# Patient Record
Sex: Male | Born: 1940 | Race: White | Hispanic: No | Marital: Married | Smoking: Former smoker
Health system: Southern US, Community
[De-identification: ages and names within clinical notes are randomized; demographics above are authoritative.]

## PROBLEM LIST (undated history)

## (undated) DIAGNOSIS — I5030 Unspecified diastolic (congestive) heart failure: Secondary | ICD-10-CM

## (undated) DIAGNOSIS — G4733 Obstructive sleep apnea (adult) (pediatric): Secondary | ICD-10-CM

## (undated) DIAGNOSIS — D472 Monoclonal gammopathy: Secondary | ICD-10-CM

## (undated) DIAGNOSIS — N400 Enlarged prostate without lower urinary tract symptoms: Secondary | ICD-10-CM

## (undated) DIAGNOSIS — S065X9A Traumatic subdural hemorrhage with loss of consciousness of unspecified duration, initial encounter: Secondary | ICD-10-CM

## (undated) DIAGNOSIS — I2510 Atherosclerotic heart disease of native coronary artery without angina pectoris: Secondary | ICD-10-CM

## (undated) DIAGNOSIS — I4891 Unspecified atrial fibrillation: Secondary | ICD-10-CM

## (undated) DIAGNOSIS — J8410 Pulmonary fibrosis, unspecified: Secondary | ICD-10-CM

## (undated) DIAGNOSIS — G629 Polyneuropathy, unspecified: Secondary | ICD-10-CM

## (undated) DIAGNOSIS — Z950 Presence of cardiac pacemaker: Secondary | ICD-10-CM

## (undated) DIAGNOSIS — E7801 Familial hypercholesterolemia: Secondary | ICD-10-CM

## (undated) DIAGNOSIS — E119 Type 2 diabetes mellitus without complications: Secondary | ICD-10-CM

## (undated) DIAGNOSIS — I10 Essential (primary) hypertension: Secondary | ICD-10-CM

## (undated) HISTORY — PX: CHOLECYSTECTOMY: SHX55

## (undated) HISTORY — DX: Pulmonary fibrosis, unspecified: J84.10

## (undated) HISTORY — DX: Obstructive sleep apnea (adult) (pediatric): G47.33

## (undated) HISTORY — PX: FOOT SURGERY: SHX648

## (undated) HISTORY — PX: ORCHIECTOMY: SHX2116

## (undated) HISTORY — PX: HERNIA REPAIR: SHX51

## (undated) HISTORY — DX: Atherosclerotic heart disease of native coronary artery without angina pectoris: I25.10

## (undated) HISTORY — DX: Presence of cardiac pacemaker: Z95.0

## (undated) HISTORY — DX: Type 2 diabetes mellitus without complications: E11.9

## (undated) HISTORY — DX: Traumatic subdural hemorrhage with loss of consciousness of unspecified duration, initial encounter: S06.5X9A

## (undated) HISTORY — DX: Polyneuropathy, unspecified: G62.9

## (undated) HISTORY — PX: CORONARY ARTERY BYPASS GRAFT: SHX141

## (undated) HISTORY — DX: Unspecified atrial fibrillation: I48.91

## (undated) HISTORY — DX: Benign prostatic hyperplasia without lower urinary tract symptoms: N40.0

## (undated) HISTORY — DX: Monoclonal gammopathy: D47.2

## (undated) HISTORY — PX: APPENDECTOMY: SHX54

## (undated) HISTORY — PX: PACEMAKER PLACEMENT: SHX43

## (undated) HISTORY — DX: Familial hypercholesterolemia: E78.01

## (undated) HISTORY — DX: Essential (primary) hypertension: I10

## (undated) HISTORY — DX: Unspecified diastolic (congestive) heart failure: I50.30

---

## 1999-01-21 ENCOUNTER — Ambulatory Visit (HOSPITAL_COMMUNITY): Admission: RE | Admit: 1999-01-21 | Discharge: 1999-01-21 | Payer: Self-pay | Admitting: Gastroenterology

## 2000-01-03 ENCOUNTER — Encounter: Admission: RE | Admit: 2000-01-03 | Discharge: 2000-01-03 | Payer: Self-pay | Admitting: Urology

## 2000-01-03 ENCOUNTER — Encounter: Payer: Self-pay | Admitting: Urology

## 2000-01-12 ENCOUNTER — Encounter: Payer: Self-pay | Admitting: Urology

## 2000-01-12 ENCOUNTER — Encounter: Admission: RE | Admit: 2000-01-12 | Discharge: 2000-01-12 | Payer: Self-pay | Admitting: Urology

## 2000-01-20 ENCOUNTER — Encounter: Admission: RE | Admit: 2000-01-20 | Discharge: 2000-01-20 | Payer: Self-pay | Admitting: Urology

## 2000-01-20 ENCOUNTER — Encounter: Payer: Self-pay | Admitting: Urology

## 2000-02-03 ENCOUNTER — Encounter: Admission: RE | Admit: 2000-02-03 | Discharge: 2000-02-03 | Payer: Self-pay | Admitting: Urology

## 2000-02-03 ENCOUNTER — Encounter: Payer: Self-pay | Admitting: Urology

## 2000-02-16 ENCOUNTER — Encounter: Payer: Self-pay | Admitting: Urology

## 2000-02-16 ENCOUNTER — Ambulatory Visit (HOSPITAL_BASED_OUTPATIENT_CLINIC_OR_DEPARTMENT_OTHER): Admission: RE | Admit: 2000-02-16 | Discharge: 2000-02-16 | Payer: Self-pay | Admitting: Urology

## 2000-10-15 ENCOUNTER — Ambulatory Visit (HOSPITAL_COMMUNITY): Admission: RE | Admit: 2000-10-15 | Discharge: 2000-10-15 | Payer: Self-pay | Admitting: Internal Medicine

## 2001-10-18 ENCOUNTER — Encounter: Admission: RE | Admit: 2001-10-18 | Discharge: 2001-10-18 | Payer: Self-pay | Admitting: General Surgery

## 2001-10-18 ENCOUNTER — Encounter (HOSPITAL_BASED_OUTPATIENT_CLINIC_OR_DEPARTMENT_OTHER): Payer: Self-pay | Admitting: General Surgery

## 2001-10-21 ENCOUNTER — Ambulatory Visit (HOSPITAL_BASED_OUTPATIENT_CLINIC_OR_DEPARTMENT_OTHER): Admission: RE | Admit: 2001-10-21 | Discharge: 2001-10-21 | Payer: Self-pay | Admitting: General Surgery

## 2003-01-01 ENCOUNTER — Encounter: Admission: RE | Admit: 2003-01-01 | Discharge: 2003-01-01 | Payer: Self-pay | Admitting: Urology

## 2003-01-01 ENCOUNTER — Encounter: Payer: Self-pay | Admitting: Urology

## 2004-03-07 ENCOUNTER — Encounter: Admission: RE | Admit: 2004-03-07 | Discharge: 2004-03-07 | Payer: Self-pay | Admitting: Internal Medicine

## 2004-06-17 ENCOUNTER — Encounter: Admission: RE | Admit: 2004-06-17 | Discharge: 2004-06-17 | Payer: Self-pay | Admitting: Neurology

## 2004-07-08 ENCOUNTER — Encounter: Admission: RE | Admit: 2004-07-08 | Discharge: 2004-07-08 | Payer: Self-pay | Admitting: Neurology

## 2004-07-22 ENCOUNTER — Encounter: Admission: RE | Admit: 2004-07-22 | Discharge: 2004-07-22 | Payer: Self-pay | Admitting: Neurology

## 2004-08-11 ENCOUNTER — Encounter: Admission: RE | Admit: 2004-08-11 | Discharge: 2004-08-11 | Payer: Self-pay | Admitting: Neurology

## 2005-08-06 ENCOUNTER — Emergency Department (HOSPITAL_COMMUNITY): Admission: EM | Admit: 2005-08-06 | Discharge: 2005-08-06 | Payer: Self-pay | Admitting: Family Medicine

## 2005-12-01 ENCOUNTER — Ambulatory Visit (HOSPITAL_COMMUNITY): Admission: RE | Admit: 2005-12-01 | Discharge: 2005-12-01 | Payer: Self-pay | Admitting: General Surgery

## 2006-07-21 ENCOUNTER — Encounter: Admission: RE | Admit: 2006-07-21 | Discharge: 2006-07-21 | Payer: Self-pay | Admitting: Internal Medicine

## 2007-02-18 ENCOUNTER — Emergency Department (HOSPITAL_COMMUNITY): Admission: EM | Admit: 2007-02-18 | Discharge: 2007-02-18 | Payer: Self-pay | Admitting: Family Medicine

## 2007-12-25 ENCOUNTER — Ambulatory Visit: Payer: Self-pay | Admitting: Vascular Surgery

## 2007-12-25 ENCOUNTER — Encounter: Payer: Self-pay | Admitting: Thoracic Surgery (Cardiothoracic Vascular Surgery)

## 2007-12-25 ENCOUNTER — Ambulatory Visit (HOSPITAL_COMMUNITY): Admission: RE | Admit: 2007-12-25 | Discharge: 2007-12-25 | Payer: Self-pay | Admitting: Cardiology

## 2007-12-26 ENCOUNTER — Ambulatory Visit: Payer: Self-pay | Admitting: Thoracic Surgery (Cardiothoracic Vascular Surgery)

## 2008-01-09 ENCOUNTER — Encounter: Payer: Self-pay | Admitting: Thoracic Surgery (Cardiothoracic Vascular Surgery)

## 2008-01-09 ENCOUNTER — Inpatient Hospital Stay (HOSPITAL_COMMUNITY)
Admission: RE | Admit: 2008-01-09 | Discharge: 2008-01-18 | Payer: Self-pay | Admitting: Thoracic Surgery (Cardiothoracic Vascular Surgery)

## 2008-01-10 ENCOUNTER — Ambulatory Visit: Payer: Self-pay | Admitting: Thoracic Surgery (Cardiothoracic Vascular Surgery)

## 2008-02-05 ENCOUNTER — Ambulatory Visit: Payer: Self-pay | Admitting: Thoracic Surgery (Cardiothoracic Vascular Surgery)

## 2008-02-05 ENCOUNTER — Encounter
Admission: RE | Admit: 2008-02-05 | Discharge: 2008-02-05 | Payer: Self-pay | Admitting: Thoracic Surgery (Cardiothoracic Vascular Surgery)

## 2008-02-10 ENCOUNTER — Encounter (HOSPITAL_COMMUNITY): Admission: RE | Admit: 2008-02-10 | Discharge: 2008-05-10 | Payer: Self-pay | Admitting: Cardiology

## 2008-02-18 ENCOUNTER — Ambulatory Visit (HOSPITAL_COMMUNITY): Admission: RE | Admit: 2008-02-18 | Discharge: 2008-02-18 | Payer: Self-pay | Admitting: Cardiology

## 2008-03-09 ENCOUNTER — Ambulatory Visit: Payer: Self-pay | Admitting: Thoracic Surgery (Cardiothoracic Vascular Surgery)

## 2008-04-06 ENCOUNTER — Ambulatory Visit (HOSPITAL_COMMUNITY): Admission: RE | Admit: 2008-04-06 | Discharge: 2008-04-07 | Payer: Self-pay | Admitting: Cardiology

## 2008-06-15 ENCOUNTER — Ambulatory Visit: Payer: Self-pay | Admitting: Thoracic Surgery (Cardiothoracic Vascular Surgery)

## 2008-08-31 ENCOUNTER — Emergency Department (HOSPITAL_COMMUNITY): Admission: EM | Admit: 2008-08-31 | Discharge: 2008-08-31 | Payer: Self-pay | Admitting: Emergency Medicine

## 2008-09-16 ENCOUNTER — Encounter: Admission: RE | Admit: 2008-09-16 | Discharge: 2008-09-16 | Payer: Self-pay | Admitting: Internal Medicine

## 2008-09-18 ENCOUNTER — Emergency Department (HOSPITAL_COMMUNITY): Admission: EM | Admit: 2008-09-18 | Discharge: 2008-09-18 | Payer: Self-pay | Admitting: Family Medicine

## 2008-10-20 ENCOUNTER — Ambulatory Visit (HOSPITAL_COMMUNITY): Admission: RE | Admit: 2008-10-20 | Discharge: 2008-10-20 | Payer: Self-pay | Admitting: *Deleted

## 2009-01-11 ENCOUNTER — Ambulatory Visit: Payer: Self-pay | Admitting: Thoracic Surgery (Cardiothoracic Vascular Surgery)

## 2009-01-16 ENCOUNTER — Emergency Department (HOSPITAL_COMMUNITY): Admission: EM | Admit: 2009-01-16 | Discharge: 2009-01-16 | Payer: Self-pay | Admitting: Emergency Medicine

## 2009-04-05 ENCOUNTER — Emergency Department (HOSPITAL_COMMUNITY): Admission: EM | Admit: 2009-04-05 | Discharge: 2009-04-05 | Payer: Self-pay | Admitting: Family Medicine

## 2009-04-15 ENCOUNTER — Emergency Department (HOSPITAL_COMMUNITY): Admission: EM | Admit: 2009-04-15 | Discharge: 2009-04-15 | Payer: Self-pay | Admitting: Family Medicine

## 2009-05-26 ENCOUNTER — Encounter: Payer: Self-pay | Admitting: Cardiology

## 2009-07-08 ENCOUNTER — Encounter: Payer: Self-pay | Admitting: Cardiology

## 2009-09-26 ENCOUNTER — Emergency Department (HOSPITAL_COMMUNITY): Admission: EM | Admit: 2009-09-26 | Discharge: 2009-09-26 | Payer: Self-pay | Admitting: Emergency Medicine

## 2009-09-27 ENCOUNTER — Emergency Department (HOSPITAL_COMMUNITY): Admission: EM | Admit: 2009-09-27 | Discharge: 2009-09-27 | Payer: Self-pay | Admitting: Emergency Medicine

## 2009-10-04 ENCOUNTER — Encounter: Payer: Self-pay | Admitting: Cardiology

## 2010-01-14 ENCOUNTER — Encounter: Payer: Self-pay | Admitting: Cardiology

## 2010-02-03 ENCOUNTER — Encounter: Payer: Self-pay | Admitting: Cardiology

## 2010-02-07 ENCOUNTER — Ambulatory Visit: Payer: Self-pay | Admitting: Thoracic Surgery (Cardiothoracic Vascular Surgery)

## 2010-04-27 ENCOUNTER — Ambulatory Visit: Payer: Self-pay | Admitting: Vascular Surgery

## 2010-05-12 ENCOUNTER — Encounter: Payer: Self-pay | Admitting: Cardiology

## 2010-08-16 DIAGNOSIS — I10 Essential (primary) hypertension: Secondary | ICD-10-CM | POA: Insufficient documentation

## 2010-08-16 DIAGNOSIS — E785 Hyperlipidemia, unspecified: Secondary | ICD-10-CM | POA: Insufficient documentation

## 2010-08-16 DIAGNOSIS — Z87898 Personal history of other specified conditions: Secondary | ICD-10-CM | POA: Insufficient documentation

## 2010-08-16 DIAGNOSIS — E119 Type 2 diabetes mellitus without complications: Secondary | ICD-10-CM | POA: Insufficient documentation

## 2010-08-16 DIAGNOSIS — G4730 Sleep apnea, unspecified: Secondary | ICD-10-CM | POA: Insufficient documentation

## 2010-08-16 DIAGNOSIS — E669 Obesity, unspecified: Secondary | ICD-10-CM | POA: Insufficient documentation

## 2010-08-16 DIAGNOSIS — I4891 Unspecified atrial fibrillation: Secondary | ICD-10-CM | POA: Insufficient documentation

## 2010-08-16 DIAGNOSIS — I499 Cardiac arrhythmia, unspecified: Secondary | ICD-10-CM | POA: Insufficient documentation

## 2010-08-17 ENCOUNTER — Ambulatory Visit: Payer: Self-pay | Admitting: Cardiology

## 2010-08-17 ENCOUNTER — Encounter: Payer: Self-pay | Admitting: Internal Medicine

## 2010-08-17 DIAGNOSIS — Z950 Presence of cardiac pacemaker: Secondary | ICD-10-CM | POA: Insufficient documentation

## 2010-08-26 ENCOUNTER — Ambulatory Visit: Payer: Self-pay | Admitting: Internal Medicine

## 2010-08-26 LAB — CONVERTED CEMR LAB: POC INR: 1.5

## 2010-09-01 ENCOUNTER — Ambulatory Visit: Payer: Self-pay | Admitting: Internal Medicine

## 2010-09-01 ENCOUNTER — Encounter: Payer: Self-pay | Admitting: Cardiology

## 2010-09-01 ENCOUNTER — Ambulatory Visit (HOSPITAL_COMMUNITY): Admission: RE | Admit: 2010-09-01 | Discharge: 2010-09-01 | Payer: Self-pay | Admitting: Cardiology

## 2010-09-01 ENCOUNTER — Ambulatory Visit: Payer: Self-pay

## 2010-09-01 ENCOUNTER — Encounter (INDEPENDENT_AMBULATORY_CARE_PROVIDER_SITE_OTHER): Payer: Self-pay

## 2010-09-01 LAB — CONVERTED CEMR LAB: POC INR: 1.5

## 2010-09-09 ENCOUNTER — Emergency Department (HOSPITAL_COMMUNITY): Admission: EM | Admit: 2010-09-09 | Discharge: 2010-09-09 | Payer: Self-pay | Admitting: Emergency Medicine

## 2010-09-09 ENCOUNTER — Ambulatory Visit: Payer: Self-pay | Admitting: Cardiology

## 2010-09-09 LAB — CONVERTED CEMR LAB: POC INR: 2

## 2010-09-14 ENCOUNTER — Ambulatory Visit (HOSPITAL_COMMUNITY): Admission: RE | Admit: 2010-09-14 | Discharge: 2010-09-14 | Payer: Self-pay | Admitting: Urology

## 2010-09-15 ENCOUNTER — Ambulatory Visit: Payer: Self-pay | Admitting: Cardiology

## 2010-09-15 LAB — CONVERTED CEMR LAB: POC INR: 1.6

## 2010-09-23 ENCOUNTER — Ambulatory Visit: Payer: Self-pay | Admitting: Cardiology

## 2010-09-23 LAB — CONVERTED CEMR LAB: POC INR: 1.7

## 2010-09-29 ENCOUNTER — Ambulatory Visit: Payer: Self-pay | Admitting: Cardiology

## 2010-09-29 LAB — CONVERTED CEMR LAB: POC INR: 2.2

## 2010-10-13 ENCOUNTER — Ambulatory Visit: Payer: Self-pay | Admitting: Cardiology

## 2010-10-13 LAB — CONVERTED CEMR LAB: POC INR: 1.8

## 2010-10-31 ENCOUNTER — Ambulatory Visit: Payer: Self-pay | Admitting: Cardiology

## 2010-10-31 LAB — CONVERTED CEMR LAB: POC INR: 2.1

## 2010-11-09 ENCOUNTER — Encounter: Payer: Self-pay | Admitting: Cardiology

## 2010-11-14 ENCOUNTER — Encounter: Payer: Self-pay | Admitting: Cardiology

## 2010-11-14 ENCOUNTER — Ambulatory Visit: Payer: Self-pay | Admitting: Internal Medicine

## 2010-11-14 LAB — CONVERTED CEMR LAB: POC INR: 1.8

## 2010-11-28 ENCOUNTER — Ambulatory Visit: Payer: Self-pay | Admitting: Cardiology

## 2010-11-28 LAB — CONVERTED CEMR LAB: POC INR: 1.7

## 2010-12-05 ENCOUNTER — Telehealth: Payer: Self-pay | Admitting: Cardiology

## 2010-12-09 LAB — CONVERTED CEMR LAB: POC INR: 1.9

## 2010-12-14 ENCOUNTER — Encounter: Payer: Self-pay | Admitting: Cardiology

## 2010-12-14 ENCOUNTER — Ambulatory Visit: Payer: Self-pay | Admitting: Cardiology

## 2010-12-20 ENCOUNTER — Ambulatory Visit: Admission: RE | Admit: 2010-12-20 | Discharge: 2010-12-20 | Payer: Self-pay | Source: Home / Self Care

## 2010-12-20 LAB — CONVERTED CEMR LAB: POC INR: 1.1

## 2010-12-27 ENCOUNTER — Ambulatory Visit: Admission: RE | Admit: 2010-12-27 | Discharge: 2010-12-27 | Payer: Self-pay | Source: Home / Self Care

## 2010-12-27 LAB — CONVERTED CEMR LAB: POC INR: 1.7

## 2011-01-08 ENCOUNTER — Encounter: Payer: Self-pay | Admitting: Thoracic Surgery (Cardiothoracic Vascular Surgery)

## 2011-01-10 ENCOUNTER — Ambulatory Visit: Admission: RE | Admit: 2011-01-10 | Discharge: 2011-01-10 | Payer: Self-pay | Source: Home / Self Care

## 2011-01-10 LAB — CONVERTED CEMR LAB
INR: 2.3
POC INR: 2.3

## 2011-01-19 NOTE — Procedures (Signed)
Summary: [facility]   Current Medications (verified): 1)  Klor-Con 20 Meq Pack (Potassium Chloride) .... Once Daily 2)  Flomax 0.4 Mg Caps (Tamsulosin Hcl) .... At Bedtime (Pt Has Rx Listed As 5mg ) 3)  Furosemide 40 Mg Tabs (Furosemide) .... 3-4 Times Per Week,  .as Needed 4)  Lopressor 50 Mg Tabs (Metoprolol Tartrate) .... 2 Daily 5)  Multaq 400 Mg Tabs (Dronedarone Hcl) .... 2 Daily 6)  Zoloft 100 Mg Tabs (Sertraline Hcl) .[name].. 1 1/2 Tablets Daily 7)  Crestor 10 Mg Tabs (Rosuvastatin Calcium) .... Once Daily 8)  Aspir-Low 81 Mg Tbec (Aspirin) .... Once Daily 9)  Marepa 1000 Mg Caps (Omega-3 Fatty Acids) .... 3 Daily 10)  Warfarin Sodium 5 Mg Tabs (Warfarin Sodium) .... Use As Directed By Anticoagulation Clinic  Allergies (verified): No Known Drug Allergies  PPM Specifications Following MD:  Lewayne Bunting, MD     PPM Vendor:  [employer] Jude     PPM Model Number:  [id number]     PPM Serial Number:  [id number] PPM DOI:  04/06/2008     PPM Implanting MD:  TYSINGER  Lead 1    Location: RA     DOI: 04/06/2008     Model #: [id number]     Serial #: [id number]     Status: active Lead 2    Location: RV     DOI: 04/06/2008     Model #: [id number]     Serial #: [id number]     Status: active  PPM Follow Up Battery Voltage:  2.75 V     Battery Est. Longevity:  7.25-9.75 YRS       PPM Device Measurements [facility]  Amplitude: 3.5 mV, Impedance: 498 ohms, Threshold: 0.75 V at 0.5 msec Right Ventricle  Amplitude: 9.6 mV, Impedance: 594 ohms, Threshold: 0.625 V at 0.5 msec  Episodes MS Episodes:  22     Percent Mode Switch:  <1%     Ventricular High Rate:  0     Atrial Pacing:  96%     Ventricular Pacing:  10%  Parameters Mode:  DDDR     Lower Rate Limit:  70     Upper Rate Limit:  120 Paced AV Delay:  200     Sensed AV Delay:  200 Next Cardiology Appt Due:  02/16/2011 Tech Comments:  NEW PT (TYSINGER PT)---22 AMS EPISODES--LONGEST WAS 3 MINUTES 32 SECONDS.  NORMAL DEVICE FUNCTION.  CHANGED RA OUTPUT FROM 2.50  TO 2.00 V.  ROV IN 6 MTHS W/GT TO ESTABLISH.  Vella Kohler  August 17, 2010 12:40 PM

## 2011-01-19 NOTE — Medication Information (Signed)
Summary: rov/sl  Anticoagulant Therapy  Managed by: Lyna Poser, PharmD PCP: Dr. Merri Brunette Supervising MD: Shirlee Latch MD, Freida Busman Indication 1: Atrial Fibrillation 427.31 Lab Used: [facility] [place] Site: [address] INR POC 2.1 INR RANGE 2.0-3.0  Dietary changes: no    Health status changes: no    Bleeding/hemorrhagic complications: no    Recent/future hospitalizations: no    Any changes in medication regimen? no    Recent/future dental: no  Any missed doses?: no       Is patient compliant with meds? yes       Allergies: 1)  * Definity  Anticoagulation Management History:      The patient is taking warfarin and comes in today for a routine follow up visit.  Positive risk factors for bleeding include an age of 51 years or older and presence of serious comorbidities.  The bleeding index is 'intermediate risk'.  Positive CHADS2 values include History of HTN and History of Diabetes.  Negative CHADS2 values include Age > 85 years old.  Anticoagulation responsible provider: Shirlee Latch MD, [place].  INR POC: 2.1.  Cuvette Lot#: [id number].  Exp: 10/19/2011.    Anticoagulation Management Assessment/Plan:      The patient's current anticoagulation dose is Warfarin sodium 5 mg tabs: Use as directed by Anticoagulation Clinic.  The target INR is 2.0-3.0.  The next INR is due 11/14/2010.  Anticoagulation instructions were given to patient.  Results were reviewed/authorized by Lyna Poser, PharmD.  He was notified by Lyna Poser PharmD.         Prior Anticoagulation Instructions: INR 1.8  Take Coumadin 1.5 tabs (7.5 mg) on Thursdays and Coumadin 1 tab (5 mg) on all other days. Return to clinic in 2 weeks.   Current Anticoagulation Instructions: INR 2.1 Continue taking 1 tablet everyday except 1.5 tablets on thursday. Recheck in 2 weeks.

## 2011-01-19 NOTE — Medication Information (Signed)
Summary: ROV  Anticoagulant Therapy  Managed by: Bethena Midget, RN, BSN PCP: Dr. Merri Brunette Supervising MD: Gala Romney MD, Reuel Boom Indication 1: Atrial Fibrillation 427.31 Lab Used: [facility] [place] Site: [address] INR POC 1.7 INR RANGE 2.0-3.0  Dietary changes: no    Health status changes: no    Bleeding/hemorrhagic complications: no    Recent/future hospitalizations: no    Any changes in medication regimen? no    Recent/future dental: no  Any missed doses?: no       Is patient compliant with meds? yes       Allergies: 1)  * Definity  Anticoagulation Management History:      The patient is taking warfarin and comes in today for a routine follow up visit.  Positive risk factors for bleeding include an age of 68 years or older and presence of serious comorbidities.  The bleeding index is 'intermediate risk'.  Positive CHADS2 values include History of HTN and History of Diabetes.  Negative CHADS2 values include Age > 68 years old.  Anticoagulation responsible provider: Bensimhon MD, Reuel Boom.  INR POC: 1.7.  Cuvette Lot#: [id number].  Exp: 01/2012.    Anticoagulation Management Assessment/Plan:      The patient's current anticoagulation dose is Warfarin sodium 5 mg tabs: Use as directed by Anticoagulation Clinic.  The target INR is 2.0-3.0.  The next INR is due 01/10/2011.  Anticoagulation instructions were given to patient.  Results were reviewed/authorized by Bethena Midget, RN, BSN.  He was notified by Bethena Midget, RN, BSN.         Prior Anticoagulation Instructions: INR: 1.1  Your INR is low today with restart of Coumadin yesterday.  Take 2 tablets tonight and tomorrow night then resume to your normal schedule of 1.5 tablet on Monday, Wednesday, Friday, and 1 tablet the other days of the week.  Return in 7 days for another INR check.            Current Anticoagulation Instructions: INR 1.7 Today take 1.5 pills then change dose to 1.5 pills everyday  except 1 pill on Tuesdays, Thursdays and Saturdays. Recheck in 2 weeks.

## 2011-01-19 NOTE — Medication Information (Signed)
Summary: rov/tm  Anticoagulant Therapy  Managed by: Weston Brass, PharmD PCP: Dr. Merri Brunette Supervising MD: Jens Som MD, Arlys John Indication 1: Atrial Fibrillation 427.31 Lab Used: [facility] [place] Site: [address] INR POC 1.7 INR RANGE 2.0-3.0  Dietary changes: yes       Details: ate some cabbage and collard greens last week  Health status changes: no    Bleeding/hemorrhagic complications: no    Recent/future hospitalizations: no    Any changes in medication regimen? no    Recent/future dental: no  Any missed doses?: no       Is patient compliant with meds? yes       Allergies: 1)  * Definity  Anticoagulation Management History:      The patient is taking warfarin and comes in today for a routine follow up visit.  Positive risk factors for bleeding include an age of 55 years or older and presence of serious comorbidities.  The bleeding index is 'intermediate risk'.  Positive CHADS2 values include History of HTN and History of Diabetes.  Negative CHADS2 values include Age > 81 years old.  Anticoagulation responsible Akul Leggette: Jens Som MD, Arlys John.  INR POC: 1.7.  Cuvette Lot#: [id number].  Exp: 09/2011.    Anticoagulation Management Assessment/Plan:      The patient's current anticoagulation dose is Warfarin sodium 5 mg tabs: Use as directed by Anticoagulation Clinic.  The target INR is 2.0-3.0.  The next INR is due 12/14/2010.  Anticoagulation instructions were given to patient.  Results were reviewed/authorized by Weston Brass, PharmD.  He was notified by Weston Brass PharmD.         Prior Anticoagulation Instructions: INR 1.8 Today take 1.5 tablets then change dose to 1 tablet everyday excetp 1.5  tablets on Mondays and Thursdays. Recheck in 2 weeks.   Current Anticoagulation Instructions: INR 1.7  Take 2 tablets today then increase dose to 1 tablet every day except 1 1/2 tablets on Monday, Wednesday and Friday.  Recheck INR in 2-3 weeks.

## 2011-01-19 NOTE — Miscellaneous (Signed)
Summary: Flushing during definity  Clinical Lists Changes  Allergies: Added new allergy or adverse reaction of * DEFINITY - Signed Observations: Added new observation of NKA: F (09/01/2010 10:52)    The patient had flushing of face during definity with no SOB,or CP, or any other symptoms per Luvenia Redden, BP 135/96 HR 69 after definity.Flushing reported to Dr. Shirlee Latch. The symptoms resolved  10 minutes after stopping definity. BP 137/78 HR 69. The patient remained in the office 45 minutes after definity stopped due to appointment for follow-up coumadin blood check per Luvenia Redden. Notified Diana @[facility] per Cleon Gustin.

## 2011-01-19 NOTE — Medication Information (Signed)
Summary: Lovenox teaching.  Anticoagulant Therapy  Managed by: Samantha Crimes, PharmD PCP: Dr. Merri Brunette Supervising MD: Riley Kill MD, Maisie Fus Indication 1: Atrial Fibrillation 427.31 Lab Used: [facility] [place] Site: [address] INR POC 1.9 INR RANGE 2.0-3.0  Dietary changes: no    Health status changes: no    Bleeding/hemorrhagic complications: no    Recent/future hospitalizations: yes       Details: procedure next wednesday, to start lovenox  Any changes in medication regimen? no    Recent/future dental: no  Any missed doses?: no       Is patient compliant with meds? yes       Current Medications (verified): 1)  Klor-Con 20 Meq Pack (Potassium Chloride) .... Once Daily 2)  Flomax 0.4 Mg Caps (Tamsulosin Hcl) .... At Bedtime (Pt Has Rx Listed As 5mg ) 3)  Furosemide 40 Mg Tabs (Furosemide) .... 3-4 Times Per Week,  .as Needed 4)  Lopressor 50 Mg Tabs (Metoprolol Tartrate) .... 2 Daily 5)  Multaq 400 Mg Tabs (Dronedarone Hcl) .... 2 Daily 6)  Zoloft 100 Mg Tabs (Sertraline Hcl) .[name].. 1 1/2 Tablets Daily 7)  Crestor 10 Mg Tabs (Rosuvastatin Calcium) .... Once Daily 8)  Aspir-Low 81 Mg Tbec (Aspirin) .... Once Daily 9)  Marepa 1000 Mg Caps (Omega-3 Fatty Acids) .... 3 Daily 10)  Warfarin Sodium 5 Mg Tabs (Warfarin Sodium) .... Use As Directed By Anticoagulation Clinic 11)  Lovenox 150 Mg/ml Soln (Enoxaparin Sodium) .... Inject 150mg s Subcutaneously Daily.  Allergies (verified): 1)  * Definity  Anticoagulation Management History:      Positive risk factors for bleeding include an age of 4 years or older and presence of serious comorbidities.  The bleeding index is 'intermediate risk'.  Positive CHADS2 values include History of HTN and History of Diabetes.  Negative CHADS2 values include Age > 66 years old.  Anticoagulation responsible provider: Riley Kill MD, Maisie Fus.  INR POC: 1.9.  Exp: 09/2011.    Anticoagulation Management Assessment/Plan:      The  patient's current anticoagulation dose is Warfarin sodium 5 mg tabs: Use as directed by Anticoagulation Clinic.  The target INR is 2.0-3.0.  The next INR is due 12/14/2010.  Anticoagulation instructions were given to patient.  Results were reviewed/authorized by Samantha Crimes, PharmD.         Prior Anticoagulation Instructions: INR 1.7  Take 2 tablets today then increase dose to 1 tablet every day except 1 1/2 tablets on Monday, Wednesday and Friday.  Recheck INR in 2-3 weeks.   Current Anticoagulation Instructions: Cont with plan for procedure, f/u on Jan 3rd at 12 pm

## 2011-01-19 NOTE — Cardiovascular Report (Signed)
Summary: Office Visit   Office Visit   Imported By: Roderic Ovens 09/13/2010 15:27:12  _____________________________________________________________________  External Attachment:    Type:   Image     Comment:   External Document

## 2011-01-19 NOTE — Progress Notes (Signed)
----   Converted from flag ---- ---- 12/05/2010 3:57 PM, Ferman Hamming, MD, [facility] wrote: H/O PAF and TIA; will need lovenox bridge. BC  ---- 12/05/2010 11:01 AM, Bethena Midget, RN, BSN wrote: Pt pending Colonoscopy and Upper edno on 12/14/10 with Dr Bosie Clos. He needs off for 5 days is he cleared? Please send response also to [facility],  Dr Bosie Clos at Fax # [phone] and office  # [phone]. ------------------------------       Additional Follow-up for Phone Call Additional follow up Details #2::    Pt to take last coumadin dose on 12/09/10. Take Nothing on 12/24. On 12/25 start Lovenox 150mg s subcutaneously into abdomen once daily in the morning until 12/27. Last Lovenox on 12/27. Procedure 12/28. Restart Coumadin on 12/28 if OK'd by GI doctor. On 12/29 Restart Lovenox 150mg  injections daily and hopefully will be on Coumadin. Continue Lovenox injection everyday and coumadin  until appt on 12/20/10.   Follow-up by: Bethena Midget, RN, BSN,  December 05, 2010 5:04 PM  Additional Follow-up for Phone Call Additional follow up Details #3:: Details for Additional Follow-up Action Taken: Info faxed to [facility] at Dr Bosie Clos office.  Additional Follow-up by: Bethena Midget, RN, BSN,  December 05, 2010 5:05 PM

## 2011-01-19 NOTE — Letter (Signed)
Summary: [facility]  [facility]   Imported By: Marylou Mccoy 12/05/2010 13:23:39  _____________________________________________________________________  External Attachment:    Type:   Image     Comment:   External Document

## 2011-01-19 NOTE — Assessment & Plan Note (Signed)
Summary: cardiac evaluation/mt   Visit Type:  NEW PT Primary [name]:  Dr. Merri Brunette  CC:  shortness of breath and tired.  History of Present Illness: 70 year old male with past medical history of coronary artery disease status post coronary bypass graft as well as Cox Maze for atrial fibrillation for establishment. Previously followed by Dr. Aleen Campi. Patient had this procedure in January of 2009. He had CABG x3  (left internal mammary artery to distal left anterior descending coronary artery, saphenous vein graft to second circumflex marginal  branch, saphenous vein graft to posterior descending coronary artery,  and modified Cox- Maze IV procedure. He has had atrial fibrillation following his procedure and also a pacemaker placed. A Myoview was performed at [facility] vascular in June of 2010. Ejection fraction was 67%. The perfusion was normal. The patient does describe dyspnea on exertion relieved with rest. This has been present since his surgery. There is no orthopnea or PND but he does have pedal edema. He has not had chest pain. He has not had palpitations or syncope.  Current Medications (verified): 1)  Klor-Con 20 Meq Pack (Potassium Chloride) .... Once Daily 2)  Flomax 0.4 Mg Caps (Tamsulosin Hcl) .... At Bedtime (Pt Has Rx Listed As 5mg ) 3)  Furosemide 40 Mg Tabs (Furosemide) .... 3-4 Times Per Week,  .as Needed 4)  Lopressor 50 Mg Tabs (Metoprolol Tartrate) .... 2 Daily 5)  Multaq 400 Mg Tabs (Dronedarone Hcl) .... 2 Daily 6)  Zoloft 100 Mg Tabs (Sertraline Hcl) .[name].. 1 1/2 Tablets Daily 7)  Crestor 10 Mg Tabs (Rosuvastatin Calcium) .... Once Daily 8)  Aspir-Low 81 Mg Tbec (Aspirin) .... Once Daily 9)  Marepa 1000 Mg Caps (Omega-3 Fatty Acids) .... 3 Daily  Allergies (verified): No Known Drug Allergies  Past History:  Past Medical History: ATRIAL FIBRILLATION  CAD HYPERLIPIDEMIA  OBESITY SLEEP APNEA  BENIGN PROSTATIC HYPERTROPHY, HX OF  DIABETES MELLITUS  (borderline) TIA  Past Surgical History: prior pacemaker placement.   Median sternotomy for coronary artery bypass grafting x3  (left internal mammary artery to distal left anterior descending  coronary artery, saphenous vein graft to second circumflex marginal  branch, saphenous vein graft to posterior descending coronary artery,  endoscopic saphenous vein harvest from right thigh) and modified Cox-  Maze IV procedure.   [name]. Cornelius Moras, M.D.  Electronically Signed CHO/MEDQ  D:  01/09/2008  T:  01/09/2008  Job:  [id number]  cc:   Antionette Char, MD  Soyla Murphy. Renne Crigler, M.D.  Foot  surgery.   Cholecystectomy  appendectomy Left orchiectomy for testicular cancer Hernia repair  Family History: Reviewed history from 08/16/2010 and no changes required. Father with CHF and previous MI (age 37 to 70)     Social History: Reviewed history from 08/16/2010 and no changes required. He is married with two children. He is a Emergency planning/management officer. He does not smoke, having quit 20 years.  Does not drink  alcohol.   Review of Systems       no fevers or chills, productive cough, hemoptysis, dysphasia, odynophagia, melena, hematochezia, dysuria, hematuria, rash, seizure activity, orthopnea, PND, claudication. Remaining systems are negative.   Vital Signs:  Patient profile:   70 year old male Height:      69 inches Weight:      229.50 pounds BMI:     34.01 Pulse rate:   70 / minute BP sitting:   112 / 76  Vitals Entered By: Caralee Ates CMA (August 17, 2010 11:28  AM)  Physical Exam  General:  Well developed/well nourished in NAD Skin warm/dry Patient not depressed No peripheral clubbing Back-normal HEENT-normal/normal eyelids Neck supple/normal carotid upstroke bilaterally; no bruits; no JVD; no thyromegaly chest - CTA/ normal expansion; previous sternotomy CV - RRR/normal S1 and S2; no murmurs, rubs or gallops;  PMI nondisplaced Abdomen -NT/ND, no HSM, no mass, + bowel sounds, no bruit; under local  hernia 2+ femoral pulses, no bruits Ext-no  chords, 2+ DP, 1+ edema or varicosities noted Neuro-grossly nonfocal     EKG  Procedure date:  08/17/2010  Findings:      Atrial paced rhythm at a rate of 70. Axis normal. Right bundle branch block.  Impression & Recommendations:  Problem # 1:  ATRIAL FIBRILLATION (ICD-427.31) Patient's device interrogated in clinic today. He is having multiple mode switches. I will continue his beta blocker and Multaq. I had long discussions in the office today concerning risk of embolic event. He has a history of borderline diabetes mellitus. He also is older than 65. He also has a history of a TIA. I think he needs Coumadin long-term and he is in agreement. I will begin 5 mg alternating with 2.5 mg p.o. daily. Check INR and Coumadin clinic on September 6. We will consider Pradaxa in the future His updated medication list for this problem includes:    Lopressor 50 Mg Tabs (Metoprolol tartrate) .[name]... 2 daily    Multaq 400 Mg Tabs (Dronedarone hcl) .[name]... 2 daily    Aspir-low 81 Mg Tbec (Aspirin) ..... Once daily    Warfarin Sodium 5 Mg Tabs (Warfarin sodium) ..... Use as directed by anticoagulation clinic  Orders: Echocardiogram (Echo)  Problem # 2:  CAD (ICD-414.00) Continue aspirin, beta blocker and statin. He is describing some dyspnea at times. Check echocardiogram. His updated medication list for this problem includes:    Lopressor 50 Mg Tabs (Metoprolol tartrate) .[name]... 2 daily    Aspir-low 81 Mg Tbec (Aspirin) ..... Once daily    Warfarin Sodium 5 Mg Tabs (Warfarin sodium) ..... Use as directed by anticoagulation clinic  Problem # 3:  HYPERLIPIDEMIA (ICD-272.4) Continue statin. Lipids and liver monitored by primary care. His updated medication list for this problem includes:    Crestor 10 Mg Tabs (Rosuvastatin calcium) ..... Once daily  Problem # 4:  HYPERTENSION (ICD-401.9) Blood pressure controlled on present medications. Will continue.  Renal function and potassium monitored by primary care. His updated medication list for this problem includes:    Furosemide 40 Mg Tabs (Furosemide) .[name]... 3-4 times per week,  .as needed    Lopressor 50 Mg Tabs (Metoprolol tartrate) .[name]... 2 daily    Aspir-low 81 Mg Tbec (Aspirin) ..... Once daily  Problem # 5:  DIABETES MELLITUS (ICD-250.00)  His updated medication list for this problem includes:    Aspir-low 81 Mg Tbec (Aspirin) ..... Once daily  Problem # 6:  PACEMAKER, PERMANENT (ICD-V45.01) Will refer to the electrophysiology for pacemaker clinic.  Patient Instructions: 1)  Your physician recommends that you schedule a follow-up appointment in: 6 MONTHS WITH DR CRENSHAW 2)  6 MONTHS WITH DR Ladona Ridgel 3)  Your physician has requested that you have an echocardiogram.  Echocardiography is a painless test that uses sound waves to create images of your heart. It provides your doctor with information about the size and shape of your heart and how well your heart's chambers and valves are working.  This procedure takes approximately one hour. There are no restrictions for this procedure. 4)  Your physician has recommended you make the following change in your medication: START WARFARIN SODIUM 5MG  ONE TABLET ALT WITH 1/2 TABLET  5)  You have been referred to COUMADIN CLINIC TUESDAY 08-23-10 Prescriptions: WARFARIN SODIUM 5 MG TABS (WARFARIN SODIUM) Use as directed by Anticoagulation Clinic  #30 x 12   Entered by:   Deliah Goody, RN   Authorized by:   Ferman Hamming, MD, [facility]   Signed by:   Deliah Goody, RN on 08/17/2010   Method used:   Electronically to        [facility] [phone]* (retail)       [address]       [place], [place]  [address]       Ph: [phone] or [phone]       Fax: [phone]   RxID:   [id number]

## 2011-01-19 NOTE — Medication Information (Signed)
Summary: [name]  Anticoagulant Therapy  Managed by: Bethena Midget, RN, BSN PCP: Dr. Merri Brunette Supervising MD: Shirlee Latch MD, [place] Indication 1: Atrial Fibrillation 427.31 Lab Used: [facility] [place] Site: [address] INR POC 1.6 INR RANGE 2.0-3.0  Dietary changes: no    Health status changes: no    Bleeding/hemorrhagic complications: yes       Details: scant hematuria    Recent/future hospitalizations: yes       Details: Post kidney stone removal yesterday at [facility].   Any changes in medication regimen? yes       Details: PRN pain med, pyridium   Recent/future dental: no  Any missed doses?: yes     Details: Held 2 days prior to Kidney stone removal  Is patient compliant with meds? yes       Allergies: 1)  * Definity  Anticoagulation Management History:      The patient is taking warfarin and comes in today for a routine follow up visit.  Positive risk factors for bleeding include an age of 68 years or older and presence of serious comorbidities.  The bleeding index is 'intermediate risk'.  Positive CHADS2 values include History of HTN and History of Diabetes.  Negative CHADS2 values include Age > 66 years old.  Anticoagulation responsible Audie Stayer: Shirlee Latch MD, [place].  INR POC: 1.6.  Cuvette Lot#: [id number].  Exp: 10/2010.    Anticoagulation Management Assessment/Plan:      The patient's current anticoagulation dose is Warfarin sodium 5 mg tabs: Use as directed by Anticoagulation Clinic.  The target INR is 2.0-3.0.  The next INR is due 09/23/2010.  Anticoagulation instructions were given to patient.  Results were reviewed/authorized by Bethena Midget, RN, BSN.  He was notified by Bethena Midget, RN, BSN.         Prior Anticoagulation Instructions: INR 2.0  Take 1.5 tablets today, then resume 1 tablet daily.  Recheck in 1 week.    Current Anticoagulation Instructions: INR 1.6 Restart 5mg  daily.  Recheck in one week.

## 2011-01-19 NOTE — Medication Information (Signed)
Summary: rov/tp  Anticoagulant Therapy  Managed by: Geoffry Paradise, PharmD PCP: Dr. Merri Brunette Supervising MD: Jens Som MD, Arlys John Indication 1: Atrial Fibrillation 427.31 Lab Used: [facility] [place] Site: [address] INR POC 1.1 INR RANGE 2.0-3.0  Dietary changes: no    Health status changes: no    Bleeding/hemorrhagic complications: no    Recent/future hospitalizations: no    Any changes in medication regimen? no    Recent/future dental: no  Any missed doses?: yes     Details: Stopped Coumadin December 23rd for colonoscopy, Lovenox on December 25th - January 1st, Restarted Coumadin yesterday (12/19/2010)  Is patient compliant with meds? yes       Allergies: 1)  * Definity  Anticoagulation Management History:      Positive risk factors for bleeding include an age of 68 years or older and presence of serious comorbidities.  The bleeding index is 'intermediate risk'.  Positive CHADS2 values include History of HTN and History of Diabetes.  Negative CHADS2 values include Age > 68 years old.  Anticoagulation responsible provider: Jens Som MD, Arlys John.  INR POC: 1.1.  Exp: 09/2011.    Anticoagulation Management Assessment/Plan:      The patient's current anticoagulation dose is Warfarin sodium 5 mg tabs: Use as directed by Anticoagulation Clinic.  The target INR is 2.0-3.0.  The next INR is due 12/27/2010.  Anticoagulation instructions were given to patient.  Results were reviewed/authorized by Geoffry Paradise, PharmD.         Prior Anticoagulation Instructions: Cont with plan for procedure, f/u on Jan 3rd at 12 pm  Current Anticoagulation Instructions: INR: 1.1  Your INR is low today with restart of Coumadin yesterday.  Take 2 tablets tonight and tomorrow night then resume to your normal schedule of 1.5 tablet on Monday, Wednesday, Friday, and 1 tablet the other days of the week.  Return in 7 days for another INR check.

## 2011-01-19 NOTE — Medication Information (Signed)
Summary: rov/tm  Anticoagulant Therapy  Managed by: Louann Sjogren, PharmD PCP: Dr. Merri Brunette Supervising MD: Eden Emms MD,Harriet Bollen Indication 1: Atrial Fibrillation 427.31 Lab Used: [facility] [place] Site: [address] INR POC 2.3 INR RANGE 2.0-3.0  Dietary changes: no    Health status changes: no    Bleeding/hemorrhagic complications: no    Recent/future hospitalizations: no    Any changes in medication regimen? no    Recent/future dental: no  Any missed doses?: no       Is patient compliant with meds? yes       Allergies: 1)  * Definity  Anticoagulation Management History:      The patient is taking warfarin and comes in today for a routine follow up visit.  Positive risk factors for bleeding include an age of 70 years or older and presence of serious comorbidities.  The bleeding index is 'intermediate risk'.  Positive CHADS2 values include History of HTN and History of Diabetes.  Negative CHADS2 values include Age > 68 years old.  Today's INR is 2.3.  Anticoagulation responsible provider: Eden Emms MD,Keishaun Hazel.  INR POC: 2.3.  Cuvette Lot#: [id number].  Exp: 01/2012.    Anticoagulation Management Assessment/Plan:      The patient's current anticoagulation dose is Warfarin sodium 5 mg tabs: Use as directed by Anticoagulation Clinic.  The target INR is 2.0-3.0.  The next INR is due 02/07/2011.  Anticoagulation instructions were given to patient.  Results were reviewed/authorized by Louann Sjogren, PharmD.         Prior Anticoagulation Instructions: INR 1.7 Today take 1.5 pills then change dose to 1.5 pills everyday except 1 pill on Tuesdays, Thursdays and Saturdays. Recheck in 2 weeks.   Current Anticoagulation Instructions: INR 2.3 (goal 2-3)  Continue taking 1 1/2 tablets everyday except take 1 tablet on Tuesdays, Thursdays, and Saturdays.  Recheck INR in 4 weeks.

## 2011-01-19 NOTE — Medication Information (Signed)
Summary: rov/tm  Anticoagulant Therapy  Managed by: Cloyde Reams, RN, BSN PCP: Dr. Merri Brunette Supervising MD: Myrtis Ser MD, Tinnie Gens Indication 1: Atrial Fibrillation 427.31 Lab Used: [facility] [place] Site: [address] INR POC 2.0 INR RANGE 2.0-3.0  Dietary changes: no    Health status changes: yes       Details: Currently trying to pass a kidney stone.   Bleeding/hemorrhagic complications: no    Recent/future hospitalizations: no    Any changes in medication regimen? no    Recent/future dental: no  Any missed doses?: no       Is patient compliant with meds? yes       Allergies: 1)  * Definity  Anticoagulation Management History:      The patient is taking warfarin and comes in today for a routine follow up visit.  Positive risk factors for bleeding include an age of 40 years or older and presence of serious comorbidities.  The bleeding index is 'intermediate risk'.  Positive CHADS2 values include History of HTN and History of Diabetes.  Negative CHADS2 values include Age > 89 years old.  Anticoagulation responsible provider: Myrtis Ser MD, Tinnie Gens.  INR POC: 2.0.  Cuvette Lot#: [id number].  Exp: 10/2010.    Anticoagulation Management Assessment/Plan:      The patient's current anticoagulation dose is Warfarin sodium 5 mg tabs: Use as directed by Anticoagulation Clinic.  The target INR is 2.0-3.0.  The next INR is due 09/15/2010.  Anticoagulation instructions were given to patient.  Results were reviewed/authorized by Cloyde Reams, RN, BSN.  He was notified by Cloyde Reams RN.         Prior Anticoagulation Instructions: INR 1.5 Today take 1.5 pill then change dose to 1 pill everyday. Recheck in one week.   Current Anticoagulation Instructions: INR 2.0  Take 1.5 tablets today, then resume 1 tablet daily.  Recheck in 1 week.

## 2011-01-19 NOTE — Medication Information (Signed)
Summary: rov/tm  Anticoagulant Therapy  Managed by: Weston Brass, PharmD PCP: Dr. Merri Brunette Supervising MD: Juanda Chance MD, Bruce Indication 1: Atrial Fibrillation 427.31 Lab Used: [facility] [place] Site: [address] INR POC 1.7 INR RANGE 2.0-3.0  Dietary changes: no    Health status changes: no    Bleeding/hemorrhagic complications: no    Recent/future hospitalizations: no    Any changes in medication regimen? no    Recent/future dental: no  Any missed doses?: no       Is patient compliant with meds? yes       Allergies: 1)  * Definity  Anticoagulation Management History:      The patient is taking warfarin and comes in today for a routine follow up visit.  Positive risk factors for bleeding include an age of 39 years or older and presence of serious comorbidities.  The bleeding index is 'intermediate risk'.  Positive CHADS2 values include History of HTN and History of Diabetes.  Negative CHADS2 values include Age > 17 years old.  Anticoagulation responsible provider: Juanda Chance MD, Smitty Cords.  INR POC: 1.7.  Cuvette Lot#: [id number].  Exp: 10/2010.    Anticoagulation Management Assessment/Plan:      The patient's current anticoagulation dose is Warfarin sodium 5 mg tabs: Use as directed by Anticoagulation Clinic.  The target INR is 2.0-3.0.  The next INR is due 09/29/2010.  Anticoagulation instructions were given to patient.  Results were reviewed/authorized by Weston Brass, PharmD.  He was notified by Ilean Skill D candidate.         Prior Anticoagulation Instructions: INR 1.6 Restart 5mg  daily.  Recheck in one week.   Current Anticoagulation Instructions: INR 1.7  Take an extra tablet today, then continue taking 1 tablet everyday. Recheck in

## 2011-01-19 NOTE — Medication Information (Signed)
Summary: CCR/COUMADIN 5MG /2.5MG /ATRIAL FIB/DM  Anticoagulant Therapy  Managed by: Bethena Midget, RN, BSN PCP: Dr. Merri Brunette Supervising MD: Tenny Craw MD,Paula INR POC 1.5  Dietary changes: no    Health status changes: no    Bleeding/hemorrhagic complications: no    Recent/future hospitalizations: no    Any changes in medication regimen? yes       Details: just started coumadin a week ago on september 2nd  Recent/future dental: no  Any missed doses?: no       Is patient compliant with meds? yes       Allergies: No Known Drug Allergies  Anticoagulation Management History:      The patient is taking warfarin and comes in today for a routine follow up visit.  Positive risk factors for bleeding include an age of 1 years or older and presence of serious comorbidities.  The bleeding index is 'intermediate risk'.  Positive CHADS2 values include History of HTN and History of Diabetes.  Negative CHADS2 values include Age > 30 years old.  Anticoagulation responsible provider: Tenny Craw MD,Paula.  INR POC: 1.5.  Cuvette Lot#: [id number].    Anticoagulation Management Assessment/Plan:      The patient's current anticoagulation dose is Warfarin sodium 5 mg tabs: Use as directed by Anticoagulation Clinic.  The next INR is due 09/01/2010.  Results were reviewed/authorized by Bethena Midget, RN, BSN.  He was notified by Bethena Midget, RN, BSN.         Current Anticoagulation Instructions: INR 1.5  Take 1.5 tablets today, then start taking 1 tablet daily except 1.5 tablets on Sundays, Tuesdays, and Thursdays.  Recheck in 1 week.

## 2011-01-19 NOTE — Medication Information (Signed)
Summary: rov/mw  Anticoagulant Therapy  Managed by: Bethena Midget, RN, BSN PCP: Dr. Merri Brunette Supervising MD: Gala Romney MD, Rudi Heap Used: [facility] [place] Site: [address] INR POC 1.5 INR RANGE 2.0-3.0  Dietary changes: no    Health status changes: no    Bleeding/hemorrhagic complications: no    Recent/future hospitalizations: no    Any changes in medication regimen? no    Recent/future dental: no  Any missed doses?: no       Is patient compliant with meds? yes      Comments: last week pt took 7.5mg s on 08/26/10, then he alternating 5mg s with 2.5mg s per Jake [address] D who saw pt.   Allergies: No Known Drug Allergies  Anticoagulation Management History:      The patient is taking warfarin and comes in today for a routine follow up visit.  Positive risk factors for bleeding include an age of 70 years or older and presence of serious comorbidities.  The bleeding index is 'intermediate risk'.  Positive CHADS2 values include History of HTN and History of Diabetes.  Negative CHADS2 values include Age > 30 years old.  Anticoagulation responsible provider: Bensimhon MD, Reuel Boom.  INR POC: 1.5.  Cuvette Lot#: [id number].  Exp: 09/2011.    Anticoagulation Management Assessment/Plan:      The patient's current anticoagulation dose is Warfarin sodium 5 mg tabs: Use as directed by Anticoagulation Clinic.  The target INR is 2.0-3.0.  The next INR is due 09/09/2010.  Anticoagulation instructions were given to patient.  Results were reviewed/authorized by Bethena Midget, RN, BSN.  He was notified by Bethena Midget, RN, BSN.         Prior Anticoagulation Instructions: INR 1.5  Take 1.5 tablets today, then start taking 1 tablet daily except 1.5 tablets on Sundays, Tuesdays, and Thursdays.  Recheck in 1 week.    Current Anticoagulation Instructions: INR 1.5 Today take 1.5 pill then change dose to 1 pill everyday. Recheck in one week.

## 2011-01-19 NOTE — Progress Notes (Signed)
Summary: [facility] Office Note   [facility] Office Note   Imported By: Roderic Ovens 09/01/2010 10:42:37  _____________________________________________________________________  External Attachment:    Type:   Image     Comment:   External Document

## 2011-01-19 NOTE — Medication Information (Signed)
Summary: rov/sel  Anticoagulant Therapy  Managed by: Weston Brass, PharmD PCP: Dr. Merri Brunette Supervising MD: Jens Som MD, Arlys John Indication 1: Atrial Fibrillation 427.31 Lab Used: [facility] [place] Site: [address] INR POC 2.2 INR RANGE 2.0-3.0  Dietary changes: no    Health status changes: no    Bleeding/hemorrhagic complications: no    Recent/future hospitalizations: no    Any changes in medication regimen? no    Recent/future dental: no  Any missed doses?: no       Is patient compliant with meds? yes       Allergies: 1)  * Definity  Anticoagulation Management History:      Positive risk factors for bleeding include an age of 70 years or older and presence of serious comorbidities.  The bleeding index is 'intermediate risk'.  Positive CHADS2 values include History of HTN and History of Diabetes.  Negative CHADS2 values include Age > 98 years old.  Anticoagulation responsible provider: Jens Som MD, Arlys John.  INR POC: 2.2.  Exp: 10/2010.    Anticoagulation Management Assessment/Plan:      The patient's current anticoagulation dose is Warfarin sodium 5 mg tabs: Use as directed by Anticoagulation Clinic.  The target INR is 2.0-3.0.  The next INR is due 10/13/2010.  Anticoagulation instructions were given to patient.  Results were reviewed/authorized by Weston Brass, PharmD.  He was notified by Haynes Hoehn, PharmD Candidate.         Prior Anticoagulation Instructions: INR 1.7  Take an extra tablet today, then continue taking 1 tablet everyday. Recheck in   Current Anticoagulation Instructions: INR 2.2  Continue Coumadin as scheduled:  1 tablet every day of the week.    Return in 2 weeks.

## 2011-01-19 NOTE — Medication Information (Signed)
Summary: rov/mw  Anticoagulant Therapy  Managed by: Bethena Midget, RN, BSN PCP: Dr. Merri Brunette Supervising MD: Tenny Craw MD, Gunnar Fusi Indication 1: Atrial Fibrillation 427.31 Lab Used: [facility] [place] Site: [address] INR POC 1.8 INR RANGE 2.0-3.0  Dietary changes: no    Health status changes: no    Bleeding/hemorrhagic complications: no    Recent/future hospitalizations: no    Any changes in medication regimen? yes       Details: Neurotin added   Recent/future dental: no  Any missed doses?: no       Is patient compliant with meds? yes       Allergies: 1)  * Definity  Anticoagulation Management History:      The patient is taking warfarin and comes in today for a routine follow up visit.  Positive risk factors for bleeding include an age of 70 years or older and presence of serious comorbidities.  The bleeding index is 'intermediate risk'.  Positive CHADS2 values include History of HTN and History of Diabetes.  Negative CHADS2 values include Age > 56 years old.  Anticoagulation responsible provider: Tenny Craw MD, Gunnar Fusi.  INR POC: 1.8.  Cuvette Lot#: [id number].  Exp: 11/2011.    Anticoagulation Management Assessment/Plan:      The patient's current anticoagulation dose is Warfarin sodium 5 mg tabs: Use as directed by Anticoagulation Clinic.  The target INR is 2.0-3.0.  The next INR is due 11/28/2010.  Anticoagulation instructions were given to patient.  Results were reviewed/authorized by Bethena Midget, RN, BSN.  He was notified by Bethena Midget, RN, BSN.         Prior Anticoagulation Instructions: INR 2.1 Continue taking 1 tablet everyday except 1.5 tablets on thursday. Recheck in 2 weeks.   Current Anticoagulation Instructions: INR 1.8 Today take 1.5 tablets then change dose to 1 tablet everyday excetp 1.5  tablets on Mondays and Thursdays. Recheck in 2 weeks.

## 2011-01-19 NOTE — Medication Information (Signed)
Summary: rov/cs  Anticoagulant Therapy  Managed by: Reina Fuse, PharmD PCP: Dr. Merri Brunette Supervising MD: Jens Som MD, Arlys John Indication 1: Atrial Fibrillation 427.31 Lab Used: [facility] [place] Site: [address] INR POC 1.8 INR RANGE 2.0-3.0  Dietary changes: no    Health status changes: no    Bleeding/hemorrhagic complications: no    Recent/future hospitalizations: no    Any changes in medication regimen? no    Recent/future dental: no  Any missed doses?: no       Is patient compliant with meds? yes       Allergies: 1)  * Definity  Anticoagulation Management History:      The patient is taking warfarin and comes in today for a routine follow up visit.  Positive risk factors for bleeding include an age of 13 years or older and presence of serious comorbidities.  The bleeding index is 'intermediate risk'.  Positive CHADS2 values include History of HTN and History of Diabetes.  Negative CHADS2 values include Age > 28 years old.  Anticoagulation responsible Rhylen Shaheen: Jens Som MD, Arlys John.  INR POC: 1.8.  Cuvette Lot#: [id number].  Exp: 10/2010.    Anticoagulation Management Assessment/Plan:      The patient's current anticoagulation dose is Warfarin sodium 5 mg tabs: Use as directed by Anticoagulation Clinic.  The target INR is 2.0-3.0.  The next INR is due 10/31/2010.  Anticoagulation instructions were given to patient.  Results were reviewed/authorized by Reina Fuse, PharmD.  He was notified by Reina Fuse PharmD.         Prior Anticoagulation Instructions: INR 2.2  Continue Coumadin as scheduled:  1 tablet every day of the week.    Return in 2 weeks.   Current Anticoagulation Instructions: INR 1.8  Take Coumadin 1.5 tabs (7.5 mg) on Thursdays and Coumadin 1 tab (5 mg) on all other days. Return to clinic in 2 weeks.

## 2011-01-31 DIAGNOSIS — I4891 Unspecified atrial fibrillation: Secondary | ICD-10-CM

## 2011-02-07 ENCOUNTER — Encounter (INDEPENDENT_AMBULATORY_CARE_PROVIDER_SITE_OTHER): Payer: Medicare Other

## 2011-02-07 ENCOUNTER — Encounter: Payer: Self-pay | Admitting: Cardiology

## 2011-02-07 DIAGNOSIS — I4891 Unspecified atrial fibrillation: Secondary | ICD-10-CM

## 2011-02-07 DIAGNOSIS — Z7901 Long term (current) use of anticoagulants: Secondary | ICD-10-CM

## 2011-02-07 LAB — CONVERTED CEMR LAB: POC INR: 2.9

## 2011-02-08 NOTE — Procedures (Signed)
[facility]  [facility]: Upper GI Endoscopy   Imported By: Earl Many 02/02/2011 16:31:00  _____________________________________________________________________  External Attachment:    Type:   Image     Comment:   External Document

## 2011-02-08 NOTE — Letter (Signed)
Summary: [facility]: Report of Surgical Pathology  [facility]: Report of Surgical Pathology   Imported By: Earl Many 02/02/2011 16:58:10  _____________________________________________________________________  External Attachment:    Type:   Image     Comment:   External Document

## 2011-02-08 NOTE — Procedures (Signed)
[facility]  [facility]   Imported By: Earl Many 02/02/2011 16:28:19  _____________________________________________________________________  External Attachment:    Type:   Image     Comment:   External Document

## 2011-02-08 NOTE — Procedures (Signed)
[facility]   [facility] Upper GI Endoscopy, Images   Imported By: Earl Many 02/02/2011 16:35:32  _____________________________________________________________________  External Attachment:    Type:   Image     Comment:   External Document

## 2011-02-08 NOTE — Procedures (Signed)
[facility]  [facility]: Colonoscopy, Images   Imported By: Earl Many 02/02/2011 16:51:53  _____________________________________________________________________  External Attachment:    Type:   Image     Comment:   External Document

## 2011-02-14 NOTE — Medication Information (Signed)
Summary: rov/sp  Anticoagulant Therapy  Managed by: Windell Hummingbird, RN PCP: Dr. Merri Brunette Supervising MD: Cassell Clement Indication 1: Atrial Fibrillation 427.31 Lab Used: [facility]  Site: [address] INR POC 2.9 INR RANGE 2.0-3.0  Dietary changes: no    Health status changes: no    Bleeding/hemorrhagic complications: no    Recent/future hospitalizations: no    Any changes in medication regimen? no    Recent/future dental: no  Any missed doses?: no       Is patient compliant with meds? yes       Allergies: 1)  * Definity  Anticoagulation Management History:      The patient is taking warfarin and comes in today for a routine follow up visit.  Positive risk factors for bleeding include an age of 70 years or older and presence of serious comorbidities.  The bleeding index is 'intermediate risk'.  Positive CHADS2 values include History of HTN and History of Diabetes.  Negative CHADS2 values include Age > 50 years old.  His last INR was 2.3.  Anticoagulation responsible provider: Nicklos Gaxiola, Maisie Fus.  INR POC: 2.9.  Cuvette Lot#: [id number].  Exp: 01/2012.    Anticoagulation Management Assessment/Plan:      The patient's current anticoagulation dose is Warfarin sodium 5 mg tabs: Use as directed by Anticoagulation Clinic.  The target INR is 2.0-3.0.  The next INR is due 03/07/2011.  Anticoagulation instructions were given to patient.  Results were reviewed/authorized by Windell Hummingbird, RN.  He was notified by Windell Hummingbird, RN.         Prior Anticoagulation Instructions: INR 2.3 (goal 2-3)  Continue taking 1 1/2 tablets everyday except take 1 tablet on Tuesdays, Thursdays, and Saturdays.  Recheck INR in 4 weeks.  Current Anticoagulation Instructions: INR 2.9 Continue taking 1 1/2 tablets every day, except take 1 tablet every Tuesday, Thursday, and Saturday. Recheck in 4 weeks.

## 2011-02-16 ENCOUNTER — Ambulatory Visit: Payer: Self-pay | Admitting: Cardiology

## 2011-02-21 ENCOUNTER — Encounter: Payer: Self-pay | Admitting: Cardiology

## 2011-02-24 ENCOUNTER — Encounter: Payer: Self-pay | Admitting: Internal Medicine

## 2011-02-24 ENCOUNTER — Encounter (INDEPENDENT_AMBULATORY_CARE_PROVIDER_SITE_OTHER): Payer: Medicare Other | Admitting: Internal Medicine

## 2011-02-24 DIAGNOSIS — I10 Essential (primary) hypertension: Secondary | ICD-10-CM

## 2011-02-24 DIAGNOSIS — I2510 Atherosclerotic heart disease of native coronary artery without angina pectoris: Secondary | ICD-10-CM

## 2011-02-24 DIAGNOSIS — I495 Sick sinus syndrome: Secondary | ICD-10-CM

## 2011-02-24 DIAGNOSIS — I4891 Unspecified atrial fibrillation: Secondary | ICD-10-CM

## 2011-02-27 ENCOUNTER — Encounter: Payer: Self-pay | Admitting: *Deleted

## 2011-02-28 NOTE — Assessment & Plan Note (Signed)
Summary: f10m/pacer check-sjm/dm/kl   Primary Provider:  Dr. Merri Brunette  CC:  pacer check.  History of Present Illness: [name] is referred today by Dr. Adelene Idler office for ongoing evaluation of his PPM in the setting of CAD and HTN and PAF. He has done well over the past year. 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 He has had no syncope. He denies c/p or sob. He has mild peripheral edema from venous insufficiency and he admits to sodium indiscretion.  Current Medications (verified): 1)  Klor-Con 20 Meq Pack (Potassium Chloride) .... Once Daily 2)  Flomax 0.4 Mg Caps (Tamsulosin Hcl) .... At Bedtime (Pt Has Rx Listed As 5mg ) 3)  Furosemide 40 Mg Tabs (Furosemide) .... 3-4 Times Per Week,  .as Needed 4)  Lopressor 50 Mg Tabs (Metoprolol Tartrate) .... 2 Daily 5)  Multaq 400 Mg Tabs (Dronedarone Hcl) .... 2 Daily 6)  Zoloft 100 Mg Tabs (Sertraline Hcl) .[name].. 1 1/2 Tablets Daily 7)  Crestor 10 Mg Tabs (Rosuvastatin Calcium) .... Once Daily 8)  Aspir-Low 81 Mg Tbec (Aspirin) .... Once Daily 9)  Marepa 1000 Mg Caps (Omega-3 Fatty Acids) .... 3 Daily 10)  Warfarin Sodium 5 Mg Tabs (Warfarin Sodium) .... Use As Directed By Anticoagulation Clinic  Allergies (verified): 1)  * Definity  Past History:  Past Medical History: Last updated: 08/17/2010 ATRIAL FIBRILLATION  CAD HYPERLIPIDEMIA  OBESITY SLEEP APNEA  BENIGN PROSTATIC HYPERTROPHY, HX OF  DIABETES MELLITUS (borderline) TIA  Past Surgical History: Last updated: 02/23/2011 prior pacemaker placement.   Median sternotomy for coronary artery bypass grafting x3  (left internal mammary artery to distal left anterior descending  coronary artery, saphenous vein graft to second circumflex marginal  branch, saphenous vein graft to posterior descending coronary artery,  endoscopic saphenous vein harvest from right thigh) and modified Cox-  Maze IV procedure.   [name].  Cornelius Moras, M.D.  Electronically Signed CHO/MEDQ  D:  01/09/2008  T:  01/09/2008  Job:  [id number]  cc:   Antionette Char, MD  Soyla Murphy. Renne Crigler, M.D.  Foot  surgery.   Cholecystectomy  appendectomy Left orchiectomy for testicular cancer Hernia repair Cystoscopy  Review of Systems       All systems reviewed and negative except as noted in the HPI.  Vital Signs:  Patient profile:   70 year old male Height:      69 inches Weight:      230 pounds BMI:     34.09 Pulse rate:   70 / minute Pulse rhythm:   regular BP sitting:   122 / 82  (left arm) Cuff size:   large  Vitals Entered By: Vikki Ports (February 24, 2011 11:47 AM)  Physical Exam  General:  Well developed/well nourished in NAD Skin warm/dry Patient not depressed No peripheral clubbing Back-normal HEENT-normal/normal eyelids Neck supple/normal carotid upstroke bilaterally; no bruits; no JVD; no thyromegaly chest - CTA/ normal expansion; previous sternotomy CV - RRR/normal S1 and S2; no murmurs, rubs or gallops;  PMI nondisplaced Abdomen -NT/ND, no HSM, no mass, + bowel sounds, no bruit; under local hernia 2+ femoral pulses, no bruits Ext-no  chords, 2+ DP, 1+ edema or varicosities noted Neuro-grossly nonfocal     PPM Specifications Following MD:  Lewayne Bunting, MD     PPM Vendor:  St Jude     PPM Model Number:  [id number]     PPM Serial Number:  [id number] PPM DOI:  04/06/2008     PPM Implanting MD:  Aleen Campi  Lead 1    Location: RA     DOI: 04/06/2008     Model #: [id number]     Serial #: [id number]     Status: active Lead 2    Location: RV     DOI: 04/06/2008     Model #: [id number]     Serial #: [id number]     Status: active  Parameters Mode:  DDDR     Lower Rate Limit:  70     Upper Rate Limit:  120 Paced AV Delay:  200     Sensed AV Delay:  200 MD Comments:  Normal device followup.  Impression & Recommendations:  Problem # 1:  PACEMAKER, PERMANENT (ICD-V45.01) His device is working normally. Will recheck in several  months.  Problem # 2:  ATRIAL FIBRILLATION (ICD-427.31) He is maintaining NSR on multaq. Will follow. His updated medication list for this problem includes:    Lopressor 50 Mg Tabs (Metoprolol tartrate) .[name]... 2 daily    Multaq 400 Mg Tabs (Dronedarone hcl) .[name]... 2 daily    Aspir-low 81 Mg Tbec (Aspirin) ..... Once daily    Warfarin Sodium 5 Mg Tabs (Warfarin sodium) ..... Use as directed by anticoagulation clinic  Problem # 3:  CAD (ICD-414.00) He denies anginal symptoms. Continue current meds. The following medications were removed from the medication list:    Lovenox 150 Mg/ml Soln (Enoxaparin sodium) ..... Inject 150mg s subcutaneously daily. His updated medication list for this problem includes:    Lopressor 50 Mg Tabs (Metoprolol tartrate) .[name]... 2 daily    Aspir-low 81 Mg Tbec (Aspirin) ..... Once daily    Warfarin Sodium 5 Mg Tabs (Warfarin sodium) ..... Use as directed by anticoagulation clinic

## 2011-03-02 LAB — DIFFERENTIAL
Basophils Absolute: 0.1 10*3/uL (ref 0.0–0.1)
Basophils Relative: 1 % (ref 0–1)
Eosinophils Absolute: 0.1 10*3/uL (ref 0.0–0.7)
Eosinophils Relative: 1 % (ref 0–5)
Lymphocytes Relative: 15 % (ref 12–46)
Lymphs Abs: 1.5 10*3/uL (ref 0.7–4.0)
Monocytes Absolute: 0.4 10*3/uL (ref 0.1–1.0)
Monocytes Relative: 4 % (ref 3–12)
Neutro Abs: 7.9 10*3/uL — ABNORMAL HIGH (ref 1.7–7.7)
Neutrophils Relative %: 79 % — ABNORMAL HIGH (ref 43–77)

## 2011-03-02 LAB — CBC
HCT: 39.2 % (ref 39.0–52.0)
Hemoglobin: 13.6 g/dL (ref 13.0–17.0)
MCH: 30 pg (ref 26.0–34.0)
MCHC: 34.7 g/dL (ref 30.0–36.0)
MCV: 86.3 fL (ref 78.0–100.0)
Platelets: 151 10*3/uL (ref 150–400)
RBC: 4.54 MIL/uL (ref 4.22–5.81)
RDW: 15.2 % (ref 11.5–15.5)
WBC: 10.1 10*3/uL (ref 4.0–10.5)

## 2011-03-02 LAB — POCT I-STAT, CHEM 8
BUN: 31 mg/dL — ABNORMAL HIGH (ref 6–23)
Calcium, Ion: 1.11 mmol/L — ABNORMAL LOW (ref 1.12–1.32)
Chloride: 107 mEq/L (ref 96–112)
Creatinine, Ser: 2.6 mg/dL — ABNORMAL HIGH (ref 0.4–1.5)
Glucose, Bld: 136 mg/dL — ABNORMAL HIGH (ref 70–99)
HCT: 42 % (ref 39.0–52.0)
Hemoglobin: 14.3 g/dL (ref 13.0–17.0)
Potassium: 4.2 mEq/L (ref 3.5–5.1)
Sodium: 140 mEq/L (ref 135–145)
TCO2: 26 mmol/L (ref 0–100)

## 2011-03-02 LAB — PROTIME-INR
INR: 1.77 — ABNORMAL HIGH (ref 0.00–1.49)
Prothrombin Time: 20.8 seconds — ABNORMAL HIGH (ref 11.6–15.2)

## 2011-03-02 LAB — COMPREHENSIVE METABOLIC PANEL
ALT: 21 U/L (ref 0–53)
AST: 23 U/L (ref 0–37)
Albumin: 3.5 g/dL (ref 3.5–5.2)
Alkaline Phosphatase: 60 U/L (ref 39–117)
BUN: 26 mg/dL — ABNORMAL HIGH (ref 6–23)
CO2: 23 mEq/L (ref 19–32)
Calcium: 8.7 mg/dL (ref 8.4–10.5)
Chloride: 107 mEq/L (ref 96–112)
Creatinine, Ser: 2.02 mg/dL — ABNORMAL HIGH (ref 0.4–1.5)
GFR calc Af Amer: 40 mL/min — ABNORMAL LOW (ref >60)
GFR calc non Af Amer: 33 mL/min — ABNORMAL LOW (ref >60)
Glucose, Bld: 100 mg/dL — ABNORMAL HIGH (ref 70–99)
Potassium: 4.2 mEq/L (ref 3.5–5.1)
Sodium: 139 mEq/L (ref 135–145)
Total Bilirubin: 0.9 mg/dL (ref 0.3–1.2)
Total Protein: 7 g/dL (ref 6.0–8.3)

## 2011-03-02 LAB — SURGICAL PCR SCREEN
MRSA, PCR: NEGATIVE
Staphylococcus aureus: NEGATIVE

## 2011-03-02 LAB — APTT: aPTT: 45 seconds — ABNORMAL HIGH (ref 24–37)

## 2011-03-07 ENCOUNTER — Encounter: Payer: Self-pay | Admitting: Cardiology

## 2011-03-07 ENCOUNTER — Ambulatory Visit (INDEPENDENT_AMBULATORY_CARE_PROVIDER_SITE_OTHER): Payer: Medicare Other | Admitting: *Deleted

## 2011-03-07 ENCOUNTER — Ambulatory Visit (INDEPENDENT_AMBULATORY_CARE_PROVIDER_SITE_OTHER): Payer: Medicare Other | Admitting: Cardiology

## 2011-03-07 DIAGNOSIS — E7801 Familial hypercholesterolemia: Secondary | ICD-10-CM

## 2011-03-07 DIAGNOSIS — R0989 Other specified symptoms and signs involving the circulatory and respiratory systems: Secondary | ICD-10-CM

## 2011-03-07 DIAGNOSIS — I2510 Atherosclerotic heart disease of native coronary artery without angina pectoris: Secondary | ICD-10-CM

## 2011-03-07 DIAGNOSIS — G4733 Obstructive sleep apnea (adult) (pediatric): Secondary | ICD-10-CM

## 2011-03-07 DIAGNOSIS — Z950 Presence of cardiac pacemaker: Secondary | ICD-10-CM

## 2011-03-07 DIAGNOSIS — E7800 Pure hypercholesterolemia, unspecified: Secondary | ICD-10-CM

## 2011-03-07 DIAGNOSIS — I4891 Unspecified atrial fibrillation: Secondary | ICD-10-CM

## 2011-03-07 DIAGNOSIS — E119 Type 2 diabetes mellitus without complications: Secondary | ICD-10-CM

## 2011-03-07 DIAGNOSIS — Z951 Presence of aortocoronary bypass graft: Secondary | ICD-10-CM | POA: Insufficient documentation

## 2011-03-07 LAB — POCT INR: INR: 3.3

## 2011-03-07 NOTE — Miscellaneous (Signed)
  Clinical Lists Changes  Medications: Changed medication from LOPRESSOR 50 MG TABS (METOPROLOL TARTRATE) 2 daily to LOPRESSOR 50 MG TABS (METOPROLOL TARTRATE) one tablet by mouth two times a day - Signed Rx of LOPRESSOR 50 MG TABS (METOPROLOL TARTRATE) one tablet by mouth two times a day;  #180 x 4;  Signed;  Entered by: Deliah Goody, RN;  Authorized by: Ferman Hamming, MD, [facility];  Method used: Faxed to [facility], [address], [place], [place]  [id number], Ph: [phone], Fax: [phone]    Prescriptions: LOPRESSOR 50 MG TABS (METOPROLOL TARTRATE) one tablet by mouth two times a day  #180 x 4   Entered by:   Deliah Goody, RN   Authorized by:   Ferman Hamming, MD, [facility]   Signed by:   Deliah Goody, RN on 02/27/2011   Method used:   Faxed to ...       [facility] (mail-order)       [address]       [place], [place]  [id number]       Ph: [phone]       Fax: [phone]   RxID:   [id number]

## 2011-03-07 NOTE — Assessment & Plan Note (Signed)
Management per EP. 

## 2011-03-07 NOTE — Assessment & Plan Note (Signed)
Blood pressure control. Continue present medications. 

## 2011-03-07 NOTE — Cardiovascular Report (Signed)
Summary: Office Visit   Office Visit   Imported By: Roderic Ovens 03/01/2011 12:02:02  _____________________________________________________________________  External Attachment:    Type:   Image     Comment:   External Document

## 2011-03-07 NOTE — Progress Notes (Signed)
HPI: 70 year old male with past medical history of coronary artery disease status post coronary bypass graft as well as Cox Maze for atrial fibrillation for establishment. Patient had this procedure in January of 2009. He had CABG x3  (left internal mammary artery to distal left anterior descending coronary artery, saphenous vein graft to second circumflex marginal  branch, saphenous vein graft to posterior descending coronary artery,  and modified Cox- Maze IV procedure). He has had atrial fibrillation following his procedure and also a pacemaker placed. A Myoview was performed at [facility] vascular in June of 2010. Ejection fraction was 67%. The perfusion was normal. Echocardiogram in September of 2011 revealed normal LV function, moderate right atrial and right ventricular enlargement, and grade 2 diastolic dysfunction. The aortic root was mildly dilated. I last saw him in August of 2011. Since then, the patient has had brief sharp pain in the left chest that lasts one to 2 seconds. He does not have exertional chest pain, dyspnea on exertion, orthopnea or syncope. Occasional mild pedal edema.   Current Outpatient Prescriptions  Medication Sig Dispense Refill  . aspirin 81 MG tablet Take 81 mg by mouth daily.        [name] dronedarone (MULTAQ) 400 MG tablet Take 400 mg by mouth 2 (two) times daily with a meal.        . furosemide (LASIX) 40 MG tablet Take 40 mg by mouth. Take 3-4 times per week, as needed       . metoprolol (LOPRESSOR) 50 MG tablet Take 50 mg by mouth 2 (two) times daily.        . Omega-3 Fatty Acids (MAREPA) 1000 MG CAPS Take by mouth. 3 daily       . potassium chloride SA (K-DUR,KLOR-CON) 20 MEQ tablet Take 20 mEq by mouth daily.        . rosuvastatin (CRESTOR) 10 MG tablet Take 10 mg by mouth daily.        . sertraline (ZOLOFT) 100 MG tablet Take 100 mg by mouth. Take 1 & 1/2 tablets daily       . Tamsulosin HCl (FLOMAX) 0.4 MG CAPS Take by mouth at bedtime.       [name]  warfarin (COUMADIN) 5 MG tablet Take by mouth as directed.        [name] DISCONTD: enoxaparin (LOVENOX) 150 MG/ML injection Inject 150 mg into the skin. daily          Past Medical History  Diagnosis Date  . Pacemaker     PPM - St. Jude  . Atrial fibrillation   . CAD (coronary artery disease), native coronary artery   . DM (diabetes mellitus)   . Hyperlipidemia type II   . OSA (obstructive sleep apnea)   . BPH (benign prostatic hyperplasia)     Past Surgical History  Procedure Date  . Foot surgery   . Cholecystectomy   . Appendectomy   . Orchiectomy     Left  /  testicular cancer  . Hernia repair   . Coronary artery bypass graft     x3 (left internal mammary artery to distal left anterior descending coronary artery, saphenous vain graft to second circumflex marginal branch, saphenous vain graft to posterior descending coronary artery, endoscopic saphenous vain harvest from right thigh) and modified Cox - Maze IV procedure.  [name]. [name],MD. Electronically signed CHO/MEDQ D: 01/09/2008/ JOB: [id number] cc:  Antionette Char MD  . Pacemaker placement     PPM - St. Jude  History   Social History  . Marital Status: Married    Spouse Name: N/A    Number of Children: N/A  . Years of Education: N/A   Occupational History  . Not on file.   Social History Main Topics  . Smoking status: Former Smoker    Quit date: 02/21/1991  . Smokeless tobacco: Not on file  . Alcohol Use: No  . Drug Use: No  . Sexually Active: Not on file   Other Topics Concern  . 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  He is a Emergency planning/management officer.      ROS: Bilateral ankle pain but no fevers or chills, productive cough, hemoptysis, dysphasia, odynophagia, melena, hematochezia, dysuria, hematuria, rash, seizure activity, orthopnea, PND, pedal edema, claudication. Remaining systems are negative.  Physical Exam: Well-developed well-nourished in no acute distress.  Skin is warm  and dry.  HEENT is normal.  Neck is supple. No thyromegaly.  Chest is clear to auscultation with normal expansion.  Cardiovascular exam is regular rate and rhythm.  Abdominal exam nontender or distended. No masses palpated. Positive bruit.  Extremities show no edema. neuro grossly intact  ECG Atrial paced rhythm, right bundle branch block, nonspecific ST changes.

## 2011-03-07 NOTE — Patient Instructions (Signed)
Abdominal Aortic  Ultrasound-Do not eat or drink anything after midnight the night prior to the procedure.  Follow up appointment in 6 months with Dr Jens Som. You will receive a letter to call to schedule.

## 2011-03-07 NOTE — Progress Notes (Signed)
Ok with recommendation

## 2011-03-07 NOTE — Assessment & Plan Note (Signed)
Continue present medications. He had brief sharp chest pain that does not sound cardiac.

## 2011-03-07 NOTE — Assessment & Plan Note (Signed)
Schedule abdominal ultrasound to R/O aneurysm.

## 2011-03-07 NOTE — Assessment & Plan Note (Signed)
Continue statin. 

## 2011-03-07 NOTE — Assessment & Plan Note (Signed)
Patient in atrial paced rhythm. Continue Multaq and coumadin. Check BMET and MG.

## 2011-03-07 NOTE — Assessment & Plan Note (Signed)
Management per primary care. 

## 2011-03-07 NOTE — Patient Instructions (Signed)
Skip today's dosage of Coumadin, then resume same dosage 1.5 tablets daily except 1 tablet on Tuesdays, Thursdays, and Saturdays.  Recheck in 3 weeks.

## 2011-03-07 NOTE — Assessment & Plan Note (Signed)
Continue present medications. Brief atypical chest pain does not sound cardiac.

## 2011-03-08 LAB — BASIC METABOLIC PANEL
BUN: 20 mg/dL (ref 6–23)
CO2: 29 mEq/L (ref 19–32)
Calcium: 8.6 mg/dL (ref 8.4–10.5)
Chloride: 105 mEq/L (ref 96–112)
Creatinine, Ser: 1.2 mg/dL (ref 0.4–1.5)
GFR: 63.96 mL/min (ref >60.00)
Glucose, Bld: 112 mg/dL — ABNORMAL HIGH (ref 70–99)
Potassium: 4 mEq/L (ref 3.5–5.1)
Sodium: 139 mEq/L (ref 135–145)

## 2011-03-08 LAB — MAGNESIUM: Magnesium: 2.4 mg/dL (ref 1.5–2.5)

## 2011-03-13 ENCOUNTER — Encounter: Payer: Self-pay | Admitting: Cardiology

## 2011-03-16 ENCOUNTER — Encounter (INDEPENDENT_AMBULATORY_CARE_PROVIDER_SITE_OTHER): Payer: Medicare Other | Admitting: Cardiology

## 2011-03-16 DIAGNOSIS — I700 Atherosclerosis of aorta: Secondary | ICD-10-CM

## 2011-03-16 DIAGNOSIS — R0989 Other specified symptoms and signs involving the circulatory and respiratory systems: Secondary | ICD-10-CM

## 2011-03-23 ENCOUNTER — Telehealth: Payer: Self-pay | Admitting: *Deleted

## 2011-03-23 ENCOUNTER — Encounter: Payer: Self-pay | Admitting: Cardiology

## 2011-03-23 NOTE — Telephone Encounter (Signed)
pt aware of carotid results Kobee Medlen  

## 2011-03-28 ENCOUNTER — Ambulatory Visit (INDEPENDENT_AMBULATORY_CARE_PROVIDER_SITE_OTHER): Payer: Medicare Other | Admitting: *Deleted

## 2011-03-28 DIAGNOSIS — I4891 Unspecified atrial fibrillation: Secondary | ICD-10-CM

## 2011-03-28 LAB — POCT INR: INR: 3.8

## 2011-03-28 NOTE — Patient Instructions (Addendum)
INR 3.8 Skip today's dose. Then Change your Sunday dose to 1 tablet. Take 1.5 tablets on Monday, Wednesday, and Friday. And 1 tablet all other days. Recheck next week.

## 2011-04-03 LAB — COMPREHENSIVE METABOLIC PANEL
ALT: 49 U/L (ref 0–53)
AST: 42 U/L — ABNORMAL HIGH (ref 0–37)
Albumin: 3.7 g/dL (ref 3.5–5.2)
Alkaline Phosphatase: 119 U/L — ABNORMAL HIGH (ref 39–117)
BUN: 22 mg/dL (ref 6–23)
CO2: 29 mEq/L (ref 19–32)
Calcium: 9.3 mg/dL (ref 8.4–10.5)
Chloride: 111 mEq/L (ref 96–112)
Creatinine, Ser: 1.56 mg/dL — ABNORMAL HIGH (ref 0.4–1.5)
GFR calc Af Amer: 54 mL/min — ABNORMAL LOW (ref >60)
GFR calc non Af Amer: 45 mL/min — ABNORMAL LOW (ref >60)
Glucose, Bld: 97 mg/dL (ref 70–99)
Potassium: 4.6 mEq/L (ref 3.5–5.1)
Sodium: 144 mEq/L (ref 135–145)
Total Bilirubin: 0.9 mg/dL (ref 0.3–1.2)
Total Protein: 7 g/dL (ref 6.0–8.3)

## 2011-04-03 LAB — AMMONIA: Ammonia: 27 umol/L (ref 11–35)

## 2011-04-03 LAB — HEPATITIS B CORE ANTIBODY, TOTAL: Hep B Core Total Ab: NEGATIVE

## 2011-04-03 LAB — IGM: IgM, Serum: 100 mg/dL (ref 60–263)

## 2011-04-03 LAB — PROTIME-INR
INR: 2.2 — ABNORMAL HIGH (ref 0.00–1.49)
Prothrombin Time: 26 seconds — ABNORMAL HIGH (ref 11.6–15.2)

## 2011-04-03 LAB — ANA: Anti Nuclear Antibody(ANA): NEGATIVE

## 2011-04-07 ENCOUNTER — Ambulatory Visit (INDEPENDENT_AMBULATORY_CARE_PROVIDER_SITE_OTHER): Payer: Medicare Other | Admitting: *Deleted

## 2011-04-07 DIAGNOSIS — I4891 Unspecified atrial fibrillation: Secondary | ICD-10-CM

## 2011-04-07 LAB — POCT INR: INR: 2.3

## 2011-04-24 ENCOUNTER — Ambulatory Visit (INDEPENDENT_AMBULATORY_CARE_PROVIDER_SITE_OTHER): Payer: Medicare Other | Admitting: *Deleted

## 2011-04-24 DIAGNOSIS — I4891 Unspecified atrial fibrillation: Secondary | ICD-10-CM

## 2011-04-24 LAB — POCT INR: INR: 2.6

## 2011-05-02 NOTE — Consult Note (Signed)
NEW PATIENT CONSULTATION   [name], [name]  DOB:  1941/06/03                                        December 26, 2007  CHART #:  [id number]   DATE [facility] ADMISSION:  January 09, 2008.   PRESENTING CHIEF COMPLAINT:  Exertional shortness of breath.   HISTORY OF PRESENT ILLNESS:  The patient is a 70 year old gentleman with  no previous history of coronary artery disease but risk factors  including a history of hypertension, hypercholesterolemia, newly  diagnosed type 2 diabetes mellitus, obesity, and remote history of  tobacco abuse.  The patient also has a history of a transient ischemic  attack approximately 10 years ago as well as obstructive sleep apnea for  which he uses CPAP at night.  The patient reports progression of  symptoms of exertional shortness of breath and fatigue over the last  several months.  He returned to see Dr. Merri Brunette, his primary care  physician, and was noted to be in persistent atrial fibrillation,  presumably of recent onset within the last month or two.  He  subsequently underwent an elective stress test on December 06, 2007.  By  report, the test was abnormal with moderate risk of reversible ischemia.  The patient subsequently underwent elective cardiac catheterization on  January 6th by Dr. Charolette Child.  The patient was found to have severe  3-vessel coronary artery disease with mild left ventricular dysfunction.  The patient has been referred for possible surgical revascularization.   REVIEW OF SYSTEMS:  GENERAL:  The patient reports progressively  exertional fatigue over the last several months.  He has also been  gaining weight over the last year or two, currently weighing  approximately 231 pounds.  He is 5 feet 9 inches tall.  CARDIAC:  The patient denies any history of chest pain, chest tightness,  chest pressure either with activity or at rest.  The patient does have  progressive symptoms of exertional  shortness of breath.  He also has had  some intermittent vague pain in his left arm that does not seem to be  related to physical activity.  The patient denies resting shortness of  breath, PND, orthopnea, palpitations, or syncope.  The patient does have  some history of moderate bilateral lower extremity edema.  RESPIRATORY:  Notable only for exertional shortness of breath.  The  patient denies productive cough, hemoptysis, wheezing.  GASTROINTESTINAL:  Negative.  The patient has no significant difficulty  swallowing.  He reports no symptoms of reflux.  He denies history of  hematochezia, hematemesis, melena.  GENITOURINARY:  Notable for some nocturia which has been controlled well  on medications for benign prostatic hypertrophy.  The patient denies  urinary urgency or frequency.  PERIPHERAL VASCULAR:  Negative.  The patient denies symptoms suggestive  of claudication.  NEUROLOGIC:  Notable for prolonged episode of transient right sided  numbness and weakness approximately 10 years ago for which he was told  he had a transient ischemic attack.  The patient has had no similar  episodes since then.  He denies any transient visual disturbances.  MUSCULOSKELETAL:  Notable for some mild arthritis and arthralgias  particularly afflicting his hands.  PSYCHIATRIC:  Negative.  HEENT:  Negative.  HEMATOLOGIC:  Negative.  The patient has recently been started on  Coumadin for his newly discovered  persistent atrial fibrillation.   PAST MEDICAL HISTORY:  1. Hypertension.  2. Hyperlipidemia.  3. Type 2 diabetes mellitus.  4. Obstructive sleep apnea on CPAP.  5. Obesity.  6. Remote tobacco abuse.  7. Persistent atrial fibrillation, newly diagnosed.   PAST SURGICAL HISTORY:  1. Testicular surgery.  2. Right knee surgery.  3. Tonsillectomy.  4. Appendectomy.  5. Heel surgery.  6. Cholecystectomy.  7. Abdominal hernia repair.   DRUG ALLERGIES:  None known.   CURRENT MEDICATIONS:  1.  Lipitor 20 mg daily.  2. Terazosin 5 mg daily.  3. Bisoprolol/hydrochlorothiazide 5/6.25 one tablet daily.  4. Aspirin 81 mg daily.  5. Coumadin 4 mg daily, except 6 mg every Monday and Friday.  The      patient stopped taking Coumadin on December 21, 2007.   PHYSICAL EXAMINATION:  General:  The patient is a well-appearing, obese,  white male who appears his stated age in no acute distress.  Vital  signs:  Blood pressure is 131/80.  Pulse is 78 and irregular.  Oxygen  saturation 95% on room air.  HEENT:  Unrevealing.  Neck:  Supple.  There  is no cervical or supraclavicular lymphadenopathy.  There is no jugular  venous distention.  No carotid bruits are noted.  Auscultation of the  chest demonstrates clear breath sounds which are symmetrical  bilaterally.  No wheezes or rhonchi are noted.  Cardiovascular:  Notable  for irregular heart rhythm.  No murmurs, rubs, or gallops are noted.  Abdomen:  Soft, nontender.  There are no palpable masses.  Bowel sounds  are present.  Extremities:  Warm and well perfused.  There is mild  bilateral lower extremity edema.  Distal pulses are palpable in the  posterior tibial position bilaterally.  There is a sign of mild venous  insufficiency but no significant varicosities or skin breakdown.  The  skin is clean, dry, healthy-appearing throughout.  Rectal:  Deferred.  Genitourinary:  Deferred.  Neurologic:  Grossly nonfocal and symmetrical  throughout.   DIAGNOSTIC TESTS:  Cardiac catheterization performed by Dr. Aleen Campi on  December 25, 2007, is reviewed.  This demonstrates severe 3-vessel  coronary artery disease with mild left ventricular dysfunction.  Specifically, there is 70% stenosis of the mid left anterior descending  coronary artery.  There is 80-90% critical stenosis of the mid left  circumflex coronary artery.  There is 70-80% stenosis of the mid right  coronary artery with right dominant coronary circulation.  Ejection  fraction is estimated  at 50%.  There is no significant mitral  regurgitation.   IMPRESSION:  Severe 3-vessel coronary artery disease with mild left  ventricular dysfunction, progressive symptoms of exertional shortness of  breath, and recently discovered persistent atrial fibrillation of  unknown duration.   I believe that the patient would best be treated with elective surgical  revascularization.  I also think that he would benefit from concomitant  maze procedure.   PLAN:  I have discussed matters at length with the patient and his wife.  Alternative treatment strategies have been discussed.  They understand  and accept all potential associated risks of surgery including, but not  limited to, risk of death, stroke, myocardial infarction, congestive  heart failure, respiratory failure, pneumonia, bleeding requiring blood  transfusion, arrhythmia, late recurrence of atrial fibrillation,  infection, need for long term permanent pacemaker, recurrent coronary  artery disease.  All their questions have been addressed.  We  tentatively plan to proceed with surgery on Thursday, January 22nd.  I  have given the patient a prescription for amiodarone to begin a loading  dose prior to surgery consisting of 400 mg twice daily.  I have  instructed him  to go ahead and stay off of Coumadin between now and the time of  surgery.  We will obtain a baseline 2D echocardiogram to evaluate left  ventricular function and atrial size.   [name]. Cornelius Moras, M.D.  Electronically Signed   CHO/MEDQ  D:  12/26/2007  T:  12/26/2007  Job:  [id number]   cc:   Antionette Char, MD  Soyla Murphy. Renne Crigler, M.D.

## 2011-05-02 NOTE — Assessment & Plan Note (Signed)
OFFICE VISIT   MASTER, TOUCHET  DOB:  [birth year]                                        March 09, 2008  CHART #:  [id number]   HISTORY OF PRESENT ILLNESS:  Patient is status post ACB x3 with a  modified Cox Maze IV procedure on 01/09/08.  The patient had originally  been seen postoperatively on 02/05/08.  At that time, he had some  complaints of mild shortness of breath, especially when lying flat, and  some mild lower extremity edema (right greater than left).  In the  interim, he has been placed back on Lasix 40 mg p.o. daily with  potassium supplement of 20 mEq p.o. daily.  In addition, he is on  amiodarone 200 mg p.o. twice daily and continues with Coumadin, although  his Coumadin dosage now is 4 mg p.o. daily.   Two EKGs were done that showed the patient may be in A flutter.   He has been in cardiac rehab for the last several weeks, and it is  going well.  Patient does notice some sternal discomfort with  increased activity but has much less shortness of breath than  previously.   PHYSICAL EXAMINATION:  VITAL SIGNS:  Pulse rate 58, respiratory rate 18,  O2 sat 93% on room air.  BP 100/61.  CARDIOVASCULAR:  No murmurs, rubs or gallops.  LUNGS:  Grossly clear to auscultation bilaterally.  No rales, rhonchi or  wheezes.  EXTREMITIES:  Trace lower extremity edema bilaterally.   IMPRESSION/PLAN:  Patient is going to continue with cardiac  rehabilitation.  He has already started driving.  He was instructed that  he may not return to work for at least one more month, that he is  allowed to start chipping and putting, regarding his golf activity.  I  have contacted Dr. Adelene Idler office.  He said originally the patient  had a follow-up appointment to see him in approximately one month, but  in light of the EKG finding of what appears to be atrial flutter, the  patient is going to be seen sometime this week.  Patient will follow up  with Dr. Aleen Campi  regarding amiodarone dosage and Coumadin instructions.   [name], P.A.-C.   [name]  D:  03/09/2008  T:  03/09/2008  Job:  [id number]   cc:   Antionette Char, MD

## 2011-05-02 NOTE — Consult Note (Signed)
NEW PATIENT CONSULTATION   [name], [name]  DOB:  [birth year]                                       04/27/2010  NFAOZ#:[id number]   The patient presents today for evaluation of venous hypertension and  lower extremity discomfort.  He is an active 70 year old gentleman who  had an episode of bleeding from his right lateral pretibial area from a  venous varix.  This was controlled in the emergency apartment with a  single skin stitch.  This happened approximately 2 months ago and he has  had no recurrent bleeding since that time.  He does not have any history  of DVT, he does have history of right saphenous vein harvest from his  knee proximally for coronary bypass grafting several years ago.  He does  report some swelling with prolonged standing and is on a diuretic.  The  swelling is equal right and left leg.  He reports some aching sensation  with prolonged standing.  He also has a tired achy sensation in his legs  with walking up hill with generalized fatigue.  He does not have any  arterial or venous ulcerations.   PAST HISTORY:  Significant for non-insulin diabetes and elevated  cholesterol, controlled with medications.  He does have known coronary  bypass grafting, prior pacemaker placement.  He has had prior foot  surgery.  He has had cholecystectomy and appendectomy.  He has also had  left orchiectomy for testicular cancer.   FAMILY HISTORY:  Negative for premature atherosclerotic disease.   SOCIAL HISTORY:  He is married with two children.  He is a Physicist, medical.  He does not smoke, having quit 20 years.  Does not drink  alcohol.   REVIEW OF SYSTEMS:  Multiply positive for:  GENERAL: Weight gain, up to 220 pounds.  CARDIAC:  Shortness of breath with exertion.  GI:  Negative.  GU:  Urinary frequency.  VASCULAR:  Pain with walking.  NEUROLOGIC:  Headache.  MUSCULOSKELETAL:  Joint pain.  PSYCHIATRIC:  Depression.  HEENT:  Hearing is failing.  He  has no skin issues and reports what he feels is easy bleeding.   PHYSICAL EXAMINATION:  Well-developed, well-nourished white male,  appearing stated age, in no acute distress.  Blood pressure is 117/76,  pulse 72, respirations 18.  HEENT:  Normal.  Chest:  Clear bilaterally  with no rales, rhonchi or wheezing.  Heart:  Regular rate and rhythm.  Carotid arteries without bruits bilaterally.  He does have 2+ radial, 1+  femoral and 1+ dorsalis pedis pulses bilaterally.  Abdomen:  Soft,  nontender.  No masses.  Musculoskeletal:  No major deformity or  cyanosis.  Neurologic:  No focal weakness or paresthesias.  Skin:  Without ulcers or rashes.  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   He underwent a screening SonoSite evaluation by myself with ultrasound  and this showed normal saphenous vein on the right from the calf.  There  is surgical absence of it from the knee proximally.   I explained to the patient and his wife that I do not see any serious  venous pathology.  He does have discomfort with prolonged standing and  swelling and I do feel that he may have improvement in his symptoms with  knee-high compression garments.  We have fitted him for  27-30 mmHg  compression garments today and instructed on the use of these.  I did  explain that if he had recurrent bleeding from his telangiectasias,  these could be treated with sclerotherapy but since he has had a single  episode, explained that would be difficult to predict if and when he  would have bleeding again and even which leg would bleed and therefore  would not recommend prophylactic sclerotherapy unless he has episodes of  recurrent bleeding.  He understands this and will see [place] again on an as-  needed basis.     Larina Earthly, M.D.  Electronically Signed   TFE/MEDQ  D:  04/27/2010  T:  04/28/2010  Job:  [id number]   cc:   Antionette Char, MD

## 2011-05-02 NOTE — Assessment & Plan Note (Signed)
OFFICE VISIT   LAURENCE, CROFFORD  DOB:  05/23/1941                                        February 05, 2008  CHART #:  [id number]   HISTORY:  Mr. Sheeran returns for his 3-week followup.  He is status post  coronary artery bypass grafting x3 with a modified Cox maze 4 procedure  for persistent atrial fibrillation on January 09, 2008.  His  postoperative course was notable for recurrent atrial fibrillation which  had converted to normal sinus rhythm at the time of discharge.  Since  his discharge, he has been doing fairly well.  He does report some mild  shortness of breath, particularly with lying flat.  He also reports some  mild lower extremity edema right side worse than left, which usually is  noted towards the end of the day.  He states that his weight has been  decreasing, and he has been taken off Lasix by Dr. Adelene Idler office.  He has been continued on Coumadin, and currently his dose is 6 mg on  Monday and Friday, and 4 mg the rest of the week.  He is also still on  amiodarone 400 mg b.i.d. as well as Lopressor 50 mg b.i.d.  He denies  any dizziness or weakness.  He is actually scheduled to start cardiac  rehab this week.   PHYSICAL EXAMINATION:  Vital signs:  Blood pressure 108/78.  Pulse is 79  and irregular.  Respirations 18.  His O2 saturation is 97%.  Examination  of the sternotomy and right lower extremity EVH incisions show them to  be healing well.  His sternum is stable to palpation.  Heart is  irregularly irregular.  Lungs are clear with slightly decreased breath  sounds in the left base.  Extremities:  He has mild right lower  extremity edema.  Rhythm strip performed in our office shows atrial  flutter with controlled rate.  Chest x-ray shows a small left pleural  effusion, which is stable from his previous exam.   ASSESSMENT AND PLAN:  Mr. Tippett is now 3 weeks status post coronary  artery bypass graft with maze procedure.  He is  currently in atrial  fibrillation with controlled rate.  He is otherwise progressing well  postoperatively.  Dr. Cornelius Moras has also seen the patient today, and has  recommended followup as soon as possible with Dr. Aleen Campi.  Dr. Cornelius Moras  will contact Dr. Aleen Campi regarding this patient and plan a course of  further action, possibly and EP study or ablation procedure in the  future.  He is asked to continue his current medication regimen.  He is  asked to hold off on resuming driving until his rhythm issues have  resolved.  He may proceed with cardiac rehab as planned.  We will see  him back in our office in 4 weeks for recheck, or sooner if he develops  any problems in the interim.   [name]. Cornelius Moras, M.D.  Electronically Signed   GC/MEDQ  D:  02/05/2008  T:  02/06/2008  Job:  [id number]   cc:   Antionette Char, MD  Soyla Murphy. Renne Crigler, M.D.

## 2011-05-02 NOTE — Op Note (Signed)
NAMEALLAH, REASON                ACCOUNT NO.:  [id number]   MEDICAL RECORD NO.:  [id number]          PATIENT TYPE:  INP   LOCATION:  2310                         FACILITY:  [facility]   PHYSICIAN:  [name]. Cornelius Moras, M.D. DATE OF BIRTH:  09/27/1941   DATE OF PROCEDURE:  01/09/2008  DATE OF DISCHARGE:                               OPERATIVE REPORT   PREOPERATIVE DIAGNOSIS:  Severe three-vessel coronary artery disease  with persistent atrial fibrillation.   POSTOPERATIVE DIAGNOSIS:  Severe three-vessel coronary artery disease  with persistent atrial fibrillation.   PROCEDURE:  Median sternotomy for coronary artery bypass grafting x3  (left internal mammary artery to distal left anterior descending  coronary artery, saphenous vein graft to second circumflex marginal  branch, saphenous vein graft to posterior descending coronary artery,  endoscopic saphenous vein harvest from right thigh) and modified Cox-  Maze IV procedure.   SURGEON:  [name]. Cornelius Moras, M.D.   ASSISTANT:  Stephanie Acre. Dominick, P.A.   ANESTHESIA:  General.   BRIEF CLINICAL NOTE:  The patient is a 70 year old male with no previous  history of coronary artery disease but risk factors notable for history  of hypertension, hypercholesterolemia, newly discovered type 2 diabetes  mellitus, obesity, and a remote history of tobacco abuse.  The patient  presents with symptoms of progressive exertional shortness of breath and  fatigue.  He was initially evaluated by his primary care physician, Dr.  Merri Brunette.  He was found to be in atrial fibrillation.  He was  subsequently referred for an elective stress test which was abnormal.  Following this, he underwent elective cardiac catheterization by Dr.  Charolette Child.  He was found to have three-vessel coronary artery  disease with preserved left ventricular function.  A full consultation  has been dictated previously.  The patient and his family have been  counseled regarding  the indications, risks, and potential benefits of  surgery.  Alternative treatment strategies have been discussed.  They  understand and accept all associated risks and desire to proceed as  described.   OPERATIVE FINDINGS:  1. Mild left ventricular dysfunction with ejection fraction estimated      at 50%.  2. Good quality left internal mammary artery conduit for grafting.  3. Good quality, although somewhat small-caliber, saphenous vein      conduit for grafting.  4. Good quality target vessels for grafting, although the distal left      anterior descending coronary artery was somewhat small.   OPERATIVE NOTE IN DETAIL:  The patient is brought to the operating room  on the above-mentioned date, and central monitoring is established by  the anesthesia service under the care and direction of Dr. Adonis Huguenin.  Specifically, a Swan-Ganz catheter is placed through the right internal  jugular approach.  A radial arterial line is placed.  Intravenous  antibiotics are administered.  Following induction with general  endotracheal anesthesia, a Foley catheter is placed.  The patient's  chest, abdomen, both groins, and both lower extremities are prepared and  draped in a sterile manner.   Baseline transesophageal echocardiogram is performed  by Dr. Krista Blue.  This demonstrates normal LV function with mild mitral regurgitation.  No  other abnormalities are noted.   A median sternotomy incision is performed, and the left internal mammary  artery is dissected from the chest wall and prepared for bypass  grafting.  The left internal mammary artery is a good-quality conduit.  Simultaneously, saphenous vein is obtained from the patient's right  thigh using endoscopic vein harvest technique.  The saphenous vein is  slightly small-caliber but otherwise good-quality conduit.  After the  saphenous vein has been removed, the small incision in the right lower  extremity is closed in multiple layers with  running absorbable suture.  The patient is heparinized systemically and the left internal mammary  artery transected distally.  It is noted to have excellent flow.   The pericardium is opened.  The ascending aorta is normal in appearance.  The ascending aorta is cannulated for cardiopulmonary bypass.  A venous  cannula is placed in the superior vena cava.  A second venous cannula is  placed low in the right atrium with the tip extending down to the  inferior vena cava.  A retrograde cardioplegic catheter is placed  through the right atrium into the coronary sinus.  Adequate  heparinization is verified.  Cardiopulmonary bypass is begun.  Vessel  loops are placed around the superior vena cava and the inferior vena  cava.  A temperature probe is placed in the left ventricular septum.  A  cardioplegia catheter is placed in the ascending aorta.   The patient is allowed to cool passively to 32 degrees systemic  temperature.  The aortic crossclamp is applied, and cold blood  cardioplegia is administered initially in an antegrade fashion through  the aortic root.  Iced saline slush is applied for topical hypothermia.  The initial cardioplegic arrest and myocardial cooling is felt to be  excellent.  Supplemental cardioplegia is administered retrograde through  the coronary sinus catheter.  Repeat doses of cardioplegia are  administered intermittently throughout the crossclamp portion of the  operation through the aortic root, down the subsequently placed vein  grafts, and retrograde through the coronary sinus catheter to maintain  left ventricular septal temperature below 15 degrees centigrade.   The heart is retracted towards the surgeon's side to expose the  reflection of the pericardium and the left-sided pulmonary veins.  The  left-sided pulmonary veins are encircled with a vessel loop.  The  [employer] Cardioblate irrigated radiofrequency bipolar ablation device  is utilized to create an  elliptical ablation lesion around the left-  sided pulmonary veins.  A second elliptical lesion is then placed around  the base of the left atrial appendage.  The unipolar irrigated  radiofrequency ablation pen is then utilized to create a linear lesion  joining the elliptical lesion surrounding the pulmonary veins with the  elliptical lesion on the base of left atrial appendage.   The following distal coronary anastomoses are performed:  (1) The second  circumflex marginal branch is grafted with a saphenous vein graft in an  end-to-side fashion.  This vessel measured 1.5 mm in diameter and is a  good-quality target vessel for grafting.  (2) The posterior descending  coronary artery is grafted with a saphenous vein graft in an end-to-side  fashion.  This vessel measured 1.5 mm in diameter and was of good  quality target vessel for grafting.  (3) The distal left anterior  descending coronary artery is grafted with the left internal mammary  artery  in an end-to-side fashion.  This vessel gets quite small towards  the apex of the heart, and the distal anastomosis is placed at the level  of the distal plaque with 70% lesion in the distal LAD.  This lesion is  spatulated open to create a patch angioplasty across the entire diseased  area.  A 1.5-mm probe will pass retrograde up the proximal LAD, and a  1.0 probe will pass distally.   A left atriotomy incision is performed posteriorly through the  interatrial groove.  The floor of the left atrium is exposed using a  self-retaining retractor.  The bipolar irrigated radiofrequency ablation  device is utilized to create an elliptical ablation lesion around the  base of the right-sided pulmonary veins.  The atriotomy incision serves  as the anterior half of this ellipse, and the posterior half is  completed with a limb of the bipolar device along the endocardial  surface and the other limb along the epicardial surface posteriorly.  A  linear  lesion is then placed across the dome of the left atrium,  extending from the cephalad apex of the atriotomy incision across the  dome to reach the cephalad apex of the elliptical ablation lesion around  the left-sided pulmonary veins.  A similar parallel lesion is created  from the caudad apex of the atriotomy incision across the back wall of  the left atrium to reach the caudad apex of the left-sided pulmonary  veins elliptical ablation lesion.  Another lesion is then created from  the caudad apex of the atriotomy incision across the back wall of the  left atrium towards the mitral valve annulus.  This lesion is completed  all the way to the posterior mitral annulus using the unipolar irrigated  radiofrequency ablation pen on the endocardial surface.  This completes  the left-sided lesion set for the Cox-Maze procedure.   The left atrial appendage is oversewn from within the left atrium using  a two-layer closure of running 3-0 Prolene suture.   The tip of the right atrial appendage is amputated.  An oblique right  atriotomy incision is performed.  The bipolar ablation device is  utilized to create longitudinal ablation lesions extending from the  posterior wall of the right atrium in a cephalad direction to reach the  posterior rim of the superior vena cava.  A similar lesion is completed  in the opposite direction to reach the posterior rim of the inferior  vena cava.  Another bipolar lesion is created across the interatrial  septum to reach the inferior rim of the fossa ovalis.  This is performed  with one limb of the ablation device along the endocardial surface of  the right atrium and the other along the endocardial surface of the left  atrium.  A bipolar lesion is created from the tip of the right atrial  appendage extending in a perpendicular direction to the other atriotomy  incision for a distance of 2 cm.  Another lesion is created in the  opposite direction towards the  acute margin of the heart.  The remainder  of the right-sided lesion set is completed with the unipolar irrigated  radiofrequency ablation pen.  Initially, a lesion is created from the  inferior rim of the fossa ovalis across the inferior rim and then across  the isthmus of the heart to reach the posterior tricuspid valve annulus.  The patient has two large muscular rims along the isthmus that seemed  somewhat prominent and may be  contributing to circuit rhythm, and an  extra ablation lesion is created to ablate both of these limbs of  muscular tissue.  A linear lesion is then created from the acute margin  of the heart and previous ablation lesion to reach the medial surface of  the tricuspid annulus on the endocardial surface.  One final lesion is  created from the anterolateral surface of the tricuspid annulus to the  acute margin right atriotomy incision.  This completes the entire right-  sided lesion set of the Cox-Maze procedure.   The left atriotomy incision is closed using a two-layer closure of  running 3-0 Prolene suture.  Both proximal saphenous vein anastomoses  are performed directly to the ascending aorta prior to removal of the  aortic crossclamp.  The left ventricular septal temperature rises  rapidly with reperfusion of the left internal mammary artery.  One final  dose of warm retrograde hot shot cardioplegia is administered.  All air  is evacuated through the aortic root.  The aortic crossclamp is removed  after a total crossclamp time of 134 minutes.   The heart begins to beat spontaneously without need for cardioversion.  The right atriotomy incisions are now closed using two-layer closures of  running 4-0 Prolene suture.  The vessel loops are removed from the  superior vena cava and the inferior vena cava.  All proximal and distal  coronary anastomoses are inspected for hemostasis and appropriate graft  orientation.  Epicardial pacing wires are affixed to the  right  ventricular free wall into the right atrial appendage.  The patient is  rewarmed to 37 degrees centigrade temperature.  The patient is weaned  from cardiopulmonary bypass.  Initially upon attempt at separation from  bypass, the patient appears somewhat sluggish with inferior wall  hypokinesis, but low-dose dopamine is begun and subsequently the patient  is weaned and separated from bypass uneventfully.  Total cardiopulmonary  bypass time of the operation is 181 minutes.  Followup transesophageal  echocardiogram performed by Dr. Krista Blue after separation from bypass  demonstrates normal left ventricular function with mild mitral  regurgitation and no significant changes from preoperatively.   The venous and arterial cannulae are removed uneventfully.  Protamine is  administered to reverse the anticoagulation.  The mediastinum and the  left chest are irrigated with saline solution containing vancomycin.  Meticulous surgical hemostasis is ascertained.  The mediastinum and the  left chest are drained with three chest tubes exited through separate  stab incisions inferiorly.  The pericardium and soft tissues anterior to  the aorta are reapproximated loosely.  The sternum is closed with double-  strength sternal wire.  The On-Q continuous pain management system is  utilized to facilitate postoperative pain control.  Two 10-inch  catheters supplied with the On-Q kit are tunneled into the deep  subcutaneous tissues and positioned just lateral to the lateral border  of the sternum on either side.  Each catheter is flushed with 5 mL of  0.5% bupivacaine solution, and ultimately they are connected to a  continuous infusion pump.  The soft tissues of the anterior sternum are  closed in multiple layers, and the skin is closed with running  subcuticular skin closure.   The patient tolerated the procedure well and is transported to the  surgical intensive care unit in stable condition.  There are  no  intraoperative complications.  All sponge, instrument, and needle counts  are verified correct at completion of the operation.  The patient was  transfused 1 pack of adult platelets and 2 units fresh frozen plasma due  to mild coagulopathy and thrombocytopenia after reversal of heparin with  protamine.      [name]. Cornelius Moras, M.D.  Electronically Signed    CHO/MEDQ  D:  01/09/2008  T:  01/09/2008  Job:  [id number]   cc:   Antionette Char, MD  Soyla Murphy. Renne Crigler, M.D.

## 2011-05-02 NOTE — Op Note (Signed)
[name], [name]                ACCOUNT NO.:  [id number]   MEDICAL RECORD NO.:  [id number]          PATIENT TYPE:  AMB   LOCATION:  ENDO                         FACILITY:  [facility]   PHYSICIAN:  Georgiana Spinner, M.D.    DATE OF BIRTH:  03/26/1941   DATE OF PROCEDURE:  DATE OF DISCHARGE:                               OPERATIVE REPORT   PROCEDURE:  Colonoscopy.   INDICATIONS:  Colon cancer screening   ANESTHESIA:  Fentanyl 100 mcg, Versed 8 mg.   PROCEDURE:  With the patient mildly sedated in the left lateral  decubitus position a rectal examination was attempted and subsequently  the Pentax videoscopic colonoscope was inserted in the rectum and passed  under direct vision through a poor prep to reach the cecum identified by  base of cecum and ileocecal valve both of which were photographed.  The  prep was poor and small lesions could easily be missed.  We washed the  areas where there appeared to be some abnormalities.  These all proved  to be negative for growth, but certainly as noted, small lesions could  be missed.  As we withdrew the colonoscope taking circumferential views  of colonic mucosa as best we could, stopping to photograph diverticula  along the way until we reached the rectum which appeared normal on  direct and retroflexed view.  The endoscope was straightened and  withdrawn.  The patient's vital signs and pulse oximeter remained  stable.  The patient tolerated the procedure well without apparent  complications.   FINDINGS:  Diverticulosis of left colon otherwise, an unremarkable  examination limited by preparation.   PLAN:  Consider repeat examination in 1 year.  We will discuss with the  patient further.           ______________________________  Georgiana Spinner, M.D.     GMO/MEDQ  D:  10/20/2008  T:  10/20/2008  Job:  [id number]

## 2011-05-02 NOTE — Cardiovascular Report (Signed)
NAMEKOHLER, PELLERITO                ACCOUNT NO.:  [id number]   MEDICAL RECORD NO.:  [id number]          PATIENT TYPE:  OIB   LOCATION:  2002                         FACILITY:  [facility]   PHYSICIAN:  Antionette Char, MD    DATE OF BIRTH:  [birth year]   DATE OF PROCEDURE:  DATE OF DISCHARGE:                            CARDIAC CATHETERIZATION   PROCEDURE:  Insertion of dual-chamber permanent transvenous pacemaker,  DDDR mode.   INDICATION FOR PROCEDURE:  This 70 year old male presents with  tachybrady syndrome with sick sinus syndrome, and paroxysmal atrial  fibrillation.  He is status post coronary artery bypass graft surgery in  January 2009, at which time, he had recurrent bouts of atrial  fibrillation.  He now presents with a progressive decreasing heart rate  in the 30s, marked weakness, and near syncope, mild amount of peripheral  edema.  He has been unable to participate in the cardiac rehab because  of his low heart rate.  His beta-blocker was stopped approximately 2  weeks ago; however, this has failed to improve his basic heart rate  significantly.  He continues to be very weak.  He is now scheduled for  cardiac pacemaker insertion because of his sick sinus syndrome and  tachybrady syndrome.   PROCEDURE:  After signing an informed consent, the patient was  premedicated with 5 mg of Valium by mouth and brought to the cardiac  catheterization lab in [facility].  His left anterior chest and  face and neck were prepped and draped in sterile fashion, and a left  transverse subclavicular plane was anesthetized locally with 1%  lidocaine.  An incision was made in this anesthetized point with the  incision being deepened into the fascial layer overlying the pectoralis  muscle.  A pocket was then formed in this fascial layer for later  insertion of a pulse generator.  Two #7 Cook introducer sheaths were  inserted percutaneously and to the left subclavian vein with the  Seldinger  wires being easily passed into the superior vena cava.  A  bipolar ventricular lead was selected made my [employer], model  Isoflex, model number [id number], serial number [id number].  After proper  preparation, it was inserted through the first [facility] introducer sheath  and advanced to the superior vena cava.  We then selected an active  fixation lead for the atrium, and it was inserted through the second  [facility] introducer sheath and advanced to the superior vena cava.  Both  sheaths were removed in the usual fashion.  The ventricular lead was  then advanced into the right atrium and right ventricle, where the tip  was positioned in the apex of the right ventricle.  Very good pacing  parameters were obtained with a minimum voltage threshold at 0.4 volts  utilizing 0.6 mA of current.  The resistance measured 616 ohms and the R-  wave sensitivity measured 6.4 mV.  After obtaining these parameters in  the ventricle, the active fixation lead was then advanced into the right  atrium, and multiple sites were explored, and we were unable to find a  satisfactory pacing site with acceptable pacing parameters.  We then  removed this lead and then inserted a passive fixation lead made by [facility].  [employer], a bipolar atrial lead, model Isoflex, model number [id number],  serial number [id number].  After proper preparation, this lead was  advanced to the right atrium, where the tip was positioned anteriorly in  the remnant of the right atrial appendage postoperatively from bypass  surgery.  We were able to manipulate this lead into a very good position  and obtain very good pacing parameters.  The P-wave sensitivity measured  2.9 mV, the minimal voltage threshold was 0.6 volts utilizing 0.8 mA of  current, and the resistance was measured at 469 ohms.  After obtaining  these pacing parameters, both leads were secured properly at their  insertion sites using 1-0 silk.  The wound was lavaged profusely with   kanamycin solution.  We then selected a pacemaker pulse generator, made  by [employer], model [place], model number [id number], serial number  [id number].  After properly analyzing the new pulse generator, it was  attached to the pacing electrode in the usual fashion.  The pulse  generator was then placed within the previously formed pocket, and the  wound was closed in layers using 2-0 Vicryl.  Final skin closure was  obtained with a cutaneous layer of Steri-Strips.  The patient tolerated  the procedure well.  No complications were noted.  At the end of the  procedure, a sterile bulky dressing was applied to the wound, and he was  admitted to telemetry for monitoring overnight and further doses of IV  Ancef.  The pacemaker is noted to be functioning properly in the DDD  mode.  Wound care instructions were given.      Antionette Char, MD  Electronically Signed     JRT/MEDQ  D:  04/06/2008  T:  04/07/2008  Job:  [id number]

## 2011-05-02 NOTE — Cardiovascular Report (Signed)
NAMEJEREMIAH, [name]                ACCOUNT NO.:  [id number]   MEDICAL RECORD NO.:  [id number]          PATIENT TYPE:  OIB   LOCATION:  2899                         FACILITY:  [facility]   PHYSICIAN:  Antionette Char, MD    DATE OF BIRTH:  1941/07/10   DATE OF PROCEDURE:  DATE OF DISCHARGE:                            CARDIAC CATHETERIZATION   DATE OF PROCEDURE:  December 25, 2007.   PROCEDURES:  1. Left heart catheterization  2. Coronary cineangiography.  3. Left internal mammary artery cineangiography.  4. Left ventricular angiography.  5. Abdominal aortogram.  6. Angioseal of the right femoral artery.   INDICATIONS FOR PROCEDURE:  This 70 year old male has a history of  chronic atrial fibrillation and a high-risk profile including smoking,  hypercholesterolemia, hypertension and borderline diabetes along with a  family history of his father dying with congestive heart failure.  Because of his high-risk profile, he underwent a cardiac stress test  which was abnormal showing moderate risk and was therefore scheduled for  a diagnostic cardiac cath.   DESCRIPTION OF PROCEDURE:  After signing an informed consent, the  patient was premedicated with 5 mg of Valium by mouth and brought to the  cardiac catheterization lab at [facility].  His right groin was  prepped and draped in a sterile fashion, and anesthetized locally with  1% lidocaine.  A 7-French introducer sheath was inserted percutaneously  into the right femoral artery using a Doppler needle for guidance and  the Seldinger wire was easily passed into the abdominal aorta.  We then  used 7-French number 4 Judkins coronary catheters to make injections  into the native coronary arteries.  The right coronary catheter was used  to make a midstream injection into the left subclavian artery  visualizing the left internal mammary artery.  A 7-French pigtail  catheter was used to measure pressures in the left ventricle and aorta,  and to make a midstream injection into the left ventricle and abdominal  aorta.  The patient tolerated the procedure well and no complications  were noted.  At the end of the procedure, the catheter and sheath were  removed from the right femoral artery and hemostasis was easily obtained  with an Angioseal closure system.   MEDICATIONS:  None.   HEMODYNAMIC DATA:  There was no gradient across the aortic valve.  Left  ventricular ejection fraction is approximately 50% which is hard to  determine because of the atrial fibrillation.   CINE FINDINGS:  Coronary cineangiography left coronary artery:  The  ostium of the left main has a  concentric lesion which is approximately  20-30% stenotic.  The distal left main has an eccentric lesion of 50%.   Left anterior descending:  There is a mild lesion in the proximal  segment which is segmental and causes a 20-30% narrowing.  The middle  segment has a focal eccentric lesion of 30-40%.  The distal segment has  a focal lesion of approximately 70%.  Intermediate branch appears  normal.   Circumflex coronary artery:  The circumflex has an eccentric lesion in  its  proximal segment causing 80-90% stenosis.  The distal circ system  appears normal.   Right coronary artery:  The ostium appears normal.  There is a critical  stenosis in the middle segment at the crux with a segmental 50% stenosis  and 2 focal stenotic lesions of 70-80%.  The posterior descending and  posterolateral branches appear normal.  Left internal mammary artery  appears normal.   Left ventricular cineangiogram:  The left ventricular chamber size is  normal with a normal, but mildly decreased overall contractility and  ejection fraction of approximately 50%.  The rhythm was irregular with  atrial fibrillation.  There was no significant mitral insufficiency  noted.   Abdominal aortogram:  There is a mild eccentric aneurysm in the distal  third of the abdominal aorta.  Both  renal arteries appear normal.  Both  common iliac and external iliac arteries appear normal.   FINAL DIAGNOSES:  1. Severe three-vessel coronary artery disease with moderate lesions      in the left main and left anterior descending.  Critical stenosis      in the circumflex and right coronary arteries.  2. Normal left internal mammary artery.  3. Fair left ventricular function approximately 50% ejection fraction.  4. Mild distal abdominal aortic aneurysm  5. Normal renal arteries.  6. 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   DISPOSITION:  We have discussed the findings with the patient and have  recommended he be evaluated for coronary artery bypass graft surgery.  We will have them see him as far as having an admission today and doing  the surgery for this admission as he is currently off Coumadin and would  not have to re-coumadinize at the present time.      Antionette Char, MD  Electronically Signed     JRT/MEDQ  D:  12/25/2007  T:  12/25/2007  Job:  [id number]   cc:   Antionette Char, MD  Cath Lab [facility]

## 2011-05-02 NOTE — Assessment & Plan Note (Signed)
OFFICE VISIT   [name], [name]  DOB:  [birth year]                                        June 15, 2008  CHART #:  [id number]   HISTORY:  The patient is seen in office visit followup on today's date.  He underwent a coronary artery bypass grafting x3 on January 09, 2008.  Additionally, he had a modified Cox-Maze IV procedure.  His last visit  to the office was March 09, 2008, at which time, he was in an atrial  flutter rhythm.  He has since gone on to electrocardioversion, which was  unsuccessful and secondarily, he has now had a permanent pacer placed.  Currently, he reports that he has been released from Cardiology to go  back to work full time.  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  Currently, he has no new specific complaints.   A telemetry strip was obtained on today's date and shows an atrial  pacing rhythm.  It appears to be about a 70 beats per minute rate.   PHYSICAL EXAMINATION:  VITAL SIGNS:  Blood pressure is 135/93, pulse 82,  respirations 18, and oxygen saturation 97% on room air.  GENERAL:  Moderately overweight, white male, in no acute distress.  PULMONARY:  Clear breath sounds throughout cardiac examination.  Regular  rate and rhythm.  Normal S1 and S2.  ABDOMEN:  Soft and nontender.  EXTREMITIES:  No edema.  Incisions are all well healed without evidence  of infection.   ASSESSMENT:  The patient continues to make excellent progress.  We will  see him in January in followup.  We, of course, can see him on a p.r.n.  basis for any surgically-related issues.   Rowe Clack, P.A.-C.   Sherryll Burger  D:  06/15/2008  T:  06/15/2008  Job:  [id number]   cc:   Antionette Char, MD  [name] Cornelius Moras, M.D.

## 2011-05-02 NOTE — Discharge Summary (Signed)
[name], [name]                ACCOUNT NO.:  [id number]   MEDICAL RECORD NO.:  [id number]          PATIENT TYPE:  INP   LOCATION:  2037                         FACILITY:  [facility]   PHYSICIAN:  [name]. Cornelius Moras, M.D. DATE OF BIRTH:  [birth year]   DATE OF ADMISSION:  01/09/2008  DATE OF DISCHARGE:  01/18/2008                               DISCHARGE SUMMARY   ADMITTING DIAGNOSES:  1. Severe three-vessel coronary artery disease.  2. Persistent atrial fibrillation.   DISCHARGE DIAGNOSES:  1. Severe three-vessel coronary artery disease.  2. Persistent atrial fibrillation.  3. Benign prostatic hypertrophy.  4. Hypertension.  5. Hyperlipidemia.  6. Newly diagnosed diabetes mellitus.  7. Obstructive sleep apnea on continuous positive airway pressure.  8. Obesity.  9. Remote history of tobacco abuse.   PROCEDURES PERFORMED:  1. Coronary artery bypass grafting x3 (left internal mammary artery to      the distal left anterior descending, saphenous vein graft to the      second circumflex marginal, saphenous vein graft to the posterior      descending).  2. Endoscopic vein harvest, right thigh.  3. Modified Cox-Maze IV procedure.   HISTORY OF PRESENT ILLNESS:  The patient is a 70 year old male with no  prior history of coronary artery disease.  Over the last several months,  he has reported progression of exertional shortness of breath and  fatigue.  He was seen by Dr. Merri Brunette, his primary care physician,  and was found to be in atrial fibrillation.  He then underwent elective  stress test on December 06, 2007, which showed moderate risk of  reversible ischemia.  He then underwent cardiac catheterization on  January 6, by Dr. Aleen Campi, which showed severe three-vessel coronary  artery disease with mild left ventricular dysfunction.  He was not felt  to be a good candidate for percutaneous intervention.  He was therefore  seen as an outpatient consultation by Dr. Tressie Stalker.   Dr. Cornelius Moras saw  the patient and reviewed his films.  He felt that he would be best  served by surgical revascularization.  He explained the risks, benefits  and alternatives of surgery to the patient and he agreed to proceed.   HOSPITAL COURSE:  He was admitted to [facility] on January 09, 2008, and underwent CABG x3 as described in detail above.  He also  underwent a modified Cox-Maze procedure for a persistent atrial  fibrillation which had been treated with Coumadin prior to surgery.  He  tolerated the procedure well and was transferred to the SICU in stable  condition.  He was able to be extubated shortly after surgery.  He  initially remained in a junctional rhythm requiring atrial pacing.  He  initially required low-dose dopamine and Neo-Synephrine drips for  hypotension, but these were weaned and discontinued over the course of  his first postoperative day.  He also had a postoperative ileus  initially and this was treated conservatively with bowel rest and slow  advancement of his diet.  He was kept in the ICU for pacer dependent  bradycardia.  Because of this, his amiodarone and Lopressor doses were  held.  His sinus node slowly began to recover and by postop day #5, he  was maintaining normal sinus rhythm off the pacer with heart rates in  the 70s and 80s.  At that time, he was able to be transferred to the  floor.  Overall, he was made slow and steady progress.  His heart rate  has waxed and waned in atrial fibrillation.  His blood pressure has been  stable and he has been restarted on Lopressor and amiodarone.  At the  present time, his heart rates are controlled with these medications and  blood pressures have been stable.  He has been somewhat volume  overloaded postoperatively requiring IV Lasix initially and presently  p.o. Lasix for diuresis.  He remains approximately 1-2 kg above his  preoperative weight.  His ileus has resolved and presently he is  tolerating a  regular diet without difficulty.  His incisions are all  healing well.  He is ambulating with cardiac rehab phase I and is making  good progress.  He has been restarted on Coumadin and his INR is  trending upward.  His most recent labs on postop day #8 show sodium 140,  potassium 4.2, BUN 34, creatinine 1.4, INR 1.4.  Hemoglobin was 11 and  hematocrit 31.  He will continue to be observed over the next 24-48  hours and it is anticipated that if no acute changes occur and he  remains stable, he will hopefully be ready for discharge home on January 18, 2008.   DISCHARGE MEDICATIONS:  1. Coumadin, dose to be determined by PT and INR drawn at the time of      discharge.  2. Aspirin 81 mg daily.  3. Lopressor 50 mg b.i.d.  4. Tylox one to two q.4 h. p.r.n. for pain.  5. Lasix 40 mg daily x 1 week.  6. K-Dur 20 mEq daily x 1 week.  7. Lipitor 20 mg nightly.  8. Hytrin 5 mg daily.  9. Fish oil 1 g 3 tablets daily.  10.Amiodarone 400 mg b.i.d.   ACTIVITY:  He is asked to refrain from driving, heavy lifting or  strenuous activity.  He may continue ambulating daily and using his  incentive spirometer.   WOUND CARE:  He may shower daily and clean his incisions with soap and  water.   DIET:  He will continue a low-fat, low-sodium diet.   FOLLOWUP:  He will need to make an appointment to see Dr. Aleen Campi in 2  weeks.  He will need a PT and INR rechecked within 48 hours of  discharge.  He will then see Dr. Cornelius Moras on February 03, 2008, at 1:45 p.m.  with a chest x-ray 30 minutes prior to this appointment from [facility].  If he experiences any problems or has questions, he is asked  to contact our office immediately.      Coral Ceo, P.A.      [name]. Cornelius Moras, M.D.  Electronically Signed    GC/MEDQ  D:  01/17/2008  T:  01/17/2008  Job:  [id number]   cc:   Soyla Murphy. Renne Crigler, M.D.  Antionette Char, MD

## 2011-05-02 NOTE — Assessment & Plan Note (Signed)
OFFICE VISIT   [name], [name]  DOB:  Apr 22, 1941                                        January 11, 2009  CHART #:  [id number]   HISTORY OF PRESENT ILLNESS:  The patient returns for routine followup  and surveillance now approximately 1-year status post coronary artery  bypass grafting x3 and Cox-maze IV procedure.  He was last seen here in  our office on June 15, 2008.  Since then, he has continued to do well.  To his knowledge, she has not had any further rhythm problems or  disturbances.  He was taken off of amiodarone and now is taking Multaq.  He remains on Coumadin.  He has not had any bleeding complications,  although he has had some problems with management of his Coumadin due to  inconsistent dosing.  He has not had any thromboembolic events.  He  reports some exertional fatigue, but otherwise feels well.  He underwent  a stress test a month ago that reportedly was felt to be low risk.  He  does not think he has had a followup echocardiogram to check LV  function.  He has mild intermittent bilateral lower extremity edema that  he controls with diuretics.  He has not had any chest pain or chest  tightness.  He has not had any tachypalpitations, dizzy spells, nor  syncope.  The remainder of his review of systems is noncontributory.  The remainder of his past medical history is unchanged.   CURRENT MEDICATIONS:  Coumadin 4 mg daily, aspirin 81 mg daily,  Lopressor 50 mg twice daily, Lipitor 20 mg daily, Hytrin 5 mg daily,  fish oil 1 capsule daily, Lasix 40 mg daily 3 times a week, potassium  chloride 20 mEq daily, metformin 500 mg daily, sertraline 50 mg daily,  and Multaq 400 mg daily.   PHYSICAL EXAMINATION:  GENERAL:  Notable for well-appearing, moderately  obese male who appears of stated age, in no acute distress.  VITAL SIGNS:  Pulse is 72 and 2-channel telemetry rhythm strip  demonstrates what appears to be atrial paced rhythm.  Blood  pressure  125/65 and oxygen saturation 96% on room air.  HEENT:  Unrevealing.  CHEST:  Auscultation of the chest reveals clear breath sounds.  CARDIOVASCULAR:  Regular rate and rhythm.  No murmurs, rubs, or gallops  are noted.  ABDOMEN:  Soft, nontender.  EXTREMITIES:  Warm and well perfused.  There is mild bilateral lower  extremity edema at the ankle.   IMPRESSION:  The patient appears to be doing well now 1 year following  coronary artery bypass grafting and maze procedure.  He ultimately  underwent permanent pacemaker placement following recurrent episodes of  atrial flutter and tachy-brady syndrome.  Since pacemaker placement, he  has done quite well and he appears to be maintaining a stable heart  rhythm.  He is not sure of his pacemaker has been interrogated recently  to find out whether or not he is having any underlying bouts of atrial  arrhythmias.   PLAN:  We will ask the patient to return for followup in 1 year's time  to see how he is doing.  I think, it would be reasonable to interrogate  his pacemaker and consider stopping Coumadin, if he remains in  consistently paced rhythm with no atrial arrhythmias.  Alternatively,  one could try taking him off of Multaq first to see if his rhythm will  remain stable without antiarrhythmic treatment at this time.  All of his  questions have been addressed.   [name]. Cornelius Moras, M.D.  Electronically Signed   CHO/MEDQ  D:  01/11/2009  T:  01/11/2009  Job:  [id number]   cc:   Antionette Char, MD  Soyla Murphy. Renne Crigler, M.D.

## 2011-05-02 NOTE — Assessment & Plan Note (Signed)
OFFICE VISIT   [name], [name]  DOB:  23-Jan-1941                                        February 07, 2010  CHART #:  [id number]   HISTORY:  The patient returns for followup now over 2 years status post  coronary artery bypass grafting x3 and modified Cox maze procedure.  He  was last seen here in the office on January 11, 2009.  Since then, he  has continued to do quite well.  He states that he has had a nuclear  stress test within the past year and reportedly he was told that the  results look good.  To his knowledge, he has not had any further  problems with his heart rhythm since his pacemaker was placed.  He has  been taken off Coumadin, although he does remain on Multaq at this time.  He reports no significant exertional chest discomfort or shortness of  breath.  The remainder of his review of systems is unremarkable.   CURRENT MEDICATIONS:  Aspirin, Lopressor, Lipitor, Lasix, potassium  chloride, metformin, sertraline, and Multaq.   PHYSICAL EXAMINATION:  Notable for well-appearing, moderately obese male  with blood pressure 102/64, pulse 78 and regular.  Two-channel telemetry  rhythm strip demonstrates what appears to be atrial-paced rhythm.  Examination of the chest reveals clear breath sounds which are  symmetrical bilaterally.  No wheezes, rales, or rhonchi are noted.  Cardiovascular exam is notable for regular rate and rhythm.  No murmurs,  rubs, or gallops are appreciated.  The abdomen is soft, nontender.  The  extremities are warm and well perfused.  There is no lower extremity  edema.   IMPRESSION:  The patient appears to be doing quite well now more than 2  years following coronary artery bypass grafting and a maze procedure.  His rhythm appear stable, and he is now no longer taking Coumadin.  Whether or not he will need to remain on Multaq or some type of other  pharmacologic therapy to prevent atrial arrhythmias indefinitely in the  future is difficult to say.  It would not be unreasonable to consider a  trial off Multaq if interrogation of his pacemaker suggests that he is  not having any significant atrial arrhythmias.   PLAN:  In the future, the patient will call and return to see [place] as  needed.  All of his questions have been addressed.   [name]. Cornelius Moras, M.D.  Electronically Signed   CHO/MEDQ  D:  02/07/2010  T:  02/07/2010  Job:  [id number]   cc:   Antionette Char, MD  Soyla Murphy. Renne Crigler, M.D.

## 2011-05-05 NOTE — Op Note (Signed)
[name], [name]                ACCOUNT NO.:  [id number]   MEDICAL RECORD NO.:  [id number]          PATIENT TYPE:  AMB   LOCATION:  DAY                          FACILITY:  [facility]   PHYSICIAN:  [name], M.D.DATE OF BIRTH:  1941/10/28   DATE OF PROCEDURE:  12/01/2005  DATE OF DISCHARGE:                                 OPERATIVE REPORT   PREOPERATIVE DIAGNOSIS:  Ventral incisional hernia.   POSTOPERATIVE DIAGNOSIS:  Incarcerated ventral incisional hernia.   OPERATION PERFORMED:  Laparoscopic repair of incarcerated ventral incisional  hernia with 15 x 20 cm Proceed surgical mesh.   SURGEON:  [name], M.D.   OPERATIVE INDICATIONS:  This is a 70 year old white man who has had two  operations around his umbilicus, one was a laparoscopic cholecystectomy and  he also had an operation for seminoma in 1996 through a vertical incision  just above the umbilicus. He has had a hernia above the umbilicus which has  been getting painful. He says things occasionally get caught in the hernia,  but he never gets nauseated or vomits. On exam, he is 5 feet 8, weight 223  and there is a 6 cm incision above the umbilicus with a ventral hernia which  is only partially reducible. The skin is healthy. He is brought to the  operating room electively for repair of his hernia.   OPERATIVE TECHNIQUE:  Following the induction of general endotracheal  anesthesia, a Foley catheter was inserted. The patient's abdomen was prepped  and draped in a sterile fashion. Intravenous antibiotics were given and the  patient was identified. 0.5% Marcaine with epinephrine was used as a local  infiltration anesthetic. An 11 mm OptiView port was placed in the left  lateral abdomen without difficulty. Pneumoperitoneum was created. A video  camera was inserted. We surveyed around and saw that there was no evidence  of any injury from the trocar insertion. We noted incarcerated omentum and a  hernia just  above the umbilicus. We ultimately placed two 5 mm trocars on  the left side, one in the left lower quadrant and one in the left upper  quadrant and near the end of the case we placed two 5 mm trocars on the  right side. Using the harmonic scalpel and blunt dissection and sharp  dissection, we dissected the omentum out of the hernia sac. Hemostasis was  good. Using a long spinal needle, we marked the edges of the hernia defect  and then measured 3-4 cm peripheral to this using a ruler and found that the  template was about 11 x 15 cm. We chose to use Proceed mesh and there was a  15 x 20 cm piece which seemed to be appropriate. This was brought to the  operative field and placed over the defect on the skin and a circle drawn  around the edges and eight equidistant points marked on the skin and the  mesh. We placed eight equidistant sutures of #0 Novofil in the mesh tying  this down and leaving the ends long. We were careful to placed this on the  rough side with the blue stripes where tissue  ingrowth is desired. The mesh  was then rolled up and inserted into the abdominal cavity and then opened up  in position. Once again, we were very careful to make sure that the smooth  side of the mesh was down toward the bowel and the blue striped side of the  mesh was up toward the abdominal wall. Using an Endoclose suture passer, we  passed all the sutures through eight small incisions just outside the oval  template of the mesh. We were careful to get about a 1 cm bite of fascia at  all points. We took down the falciform ligament just a little bit superiorly  to make the mesh lay smoothly. After all eight sutures were passed, we then  tied them all down and this provided a very secure fixation of the mesh. We  used a 5 mm tacker and placed 5-mm screw tacks at the periphery of the mesh  all the way around, no greater than 1 cm apart. We also placed about 8 or 10  tacks more centrally to fix the mesh  to the abdominal wall. We had a little  bit of bleeding from one of the trocar sites on the right lower quadrant  which required closure with 2-0 Vicryl suture using the Endoclose suture.  This controlled bleeding quite nicely. We also had a little bit of bleeding  from one of the trocar sites in the left lower quadrant and likewise we  closed this with a figure-of-eight suture of 2-0 Vicryl using the Endoclose  device. We observed both of these areas of bleeding and they were completely  hemostatic. The trocars were otherwise removed under direct vision and there  was no further bleeding. The pneumoperitoneum was released. The skin  incisions were closed with subcuticular sutures of 4-0 Monocryl and Steri-  Strips. Clean bandages were placed and the patient taken to the recovery  room in stable condition. Estimated blood loss was about 15-20 mL.  Complications none. Sponge, needle and instrument counts were correct.      [name], M.D.  Electronically Signed     HMI/MEDQ  D:  12/01/2005  T:  12/01/2005  Job:  [id number]   cc:   [name], M.D.  Fax: [phone]

## 2011-05-05 NOTE — Op Note (Signed)
[place]  Patient:    [name], [name] Visit Number: [id number] MRN: [id number]          Service Type: DSU Location: [facility] Attending Physician:  Fortino Sic Dictated by:   Marnee Spring. Wiliam Ke, M.D. Proc. Date: 10/21/01 Admit Date:  10/21/2001 Discharge Date: 10/21/2001                             Operative Report  PREOPERATIVE DIAGNOSIS:  Ventral hernia.  POSTOPERATIVE DIAGNOSIS:  Ventral hernia.  OPERATION PERFORMED:  Repair of ventral hernia.  SURGEON:  Marnee Spring. Wiliam Ke, M.D.  ASSISTANT:  None.  ANESTHESIA:  Endotracheal by hospital.  DESCRIPTION OF PROCEDURE:  Under good endotracheal anesthesia, the skin of the abdomen was prepped and draped in the usual manner.  A midline incision was made above the site of the previous gallbladder incision near the umbilicus. This was deepened through the subcutaneous tissue.  The hernia was identified, cleared from surrounding soft tissue.  The hernia content was pushed back into the preperitoneal space and the hernia wound was then then closed with interrupted musculofascial sutures of #1 Novofil.  Three sutures were used. Hemostasis was excellent.  Subcutaneous tissues was closed with 2-0 Vicryl. Skin was closed with subcuticular 4-0 Dexon and Steri-Strips were applied. Estimated blood loss for this procedure was minimal.  The patient received no blood and left the operating room in satisfactory condition after sponge and needle counts were verified. Dictated by:   Marnee Spring. Wiliam Ke, M.D. Attending Physician:  Fortino Sic DD:  10/21/01 TD:  10/22/01 Job: [id number]

## 2011-05-22 ENCOUNTER — Ambulatory Visit (INDEPENDENT_AMBULATORY_CARE_PROVIDER_SITE_OTHER): Payer: Medicare Other | Admitting: *Deleted

## 2011-05-22 DIAGNOSIS — I4891 Unspecified atrial fibrillation: Secondary | ICD-10-CM

## 2011-05-22 LAB — POCT INR: INR: 1.8

## 2011-06-12 ENCOUNTER — Ambulatory Visit (INDEPENDENT_AMBULATORY_CARE_PROVIDER_SITE_OTHER): Payer: Medicare Other | Admitting: *Deleted

## 2011-06-12 DIAGNOSIS — I4891 Unspecified atrial fibrillation: Secondary | ICD-10-CM

## 2011-06-12 LAB — POCT INR: INR: 2.1

## 2011-07-10 ENCOUNTER — Ambulatory Visit (INDEPENDENT_AMBULATORY_CARE_PROVIDER_SITE_OTHER): Payer: Medicare Other | Admitting: *Deleted

## 2011-07-10 ENCOUNTER — Encounter: Payer: Medicare Other | Admitting: *Deleted

## 2011-07-10 DIAGNOSIS — I4891 Unspecified atrial fibrillation: Secondary | ICD-10-CM

## 2011-07-10 LAB — POCT INR: INR: 2.4

## 2011-08-02 ENCOUNTER — Ambulatory Visit (INDEPENDENT_AMBULATORY_CARE_PROVIDER_SITE_OTHER): Payer: Medicare Other | Admitting: *Deleted

## 2011-08-02 DIAGNOSIS — I4891 Unspecified atrial fibrillation: Secondary | ICD-10-CM

## 2011-08-02 LAB — POCT INR: INR: 1.7

## 2011-08-23 ENCOUNTER — Ambulatory Visit (INDEPENDENT_AMBULATORY_CARE_PROVIDER_SITE_OTHER): Payer: Medicare Other | Admitting: *Deleted

## 2011-08-23 DIAGNOSIS — I4891 Unspecified atrial fibrillation: Secondary | ICD-10-CM

## 2011-08-23 LAB — POCT INR: INR: 1.6

## 2011-09-07 LAB — COMPREHENSIVE METABOLIC PANEL
ALT: 30
AST: 27
Albumin: 3.8
Alkaline Phosphatase: 65
BUN: 24 — ABNORMAL HIGH
CO2: 29
Calcium: 8.7
Chloride: 101
Creatinine, Ser: 1.17
GFR calc Af Amer: >60
GFR calc non Af Amer: >60
Glucose, Bld: 112 — ABNORMAL HIGH
Potassium: 4.2
Sodium: 138
Total Bilirubin: 0.8
Total Protein: 6.7

## 2011-09-07 LAB — CBC
HCT: 43.2
Hemoglobin: 14.8
MCHC: 34.4
MCV: 94.8
Platelets: 182
RBC: 4.55
RDW: 13.6
WBC: 7.2

## 2011-09-07 LAB — APTT: aPTT: 28

## 2011-09-07 LAB — PROTIME-INR
INR: 1.1
Prothrombin Time: 14.8

## 2011-09-08 LAB — POCT I-STAT 3, ART BLOOD GAS (G3+)
Acid-Base Excess: 1
Acid-Base Excess: 1
Acid-base deficit: 6 — ABNORMAL HIGH
Bicarbonate: 19 — ABNORMAL LOW
Bicarbonate: 24.8 — ABNORMAL HIGH
Bicarbonate: 25 — ABNORMAL HIGH
Bicarbonate: 25.5 — ABNORMAL HIGH
O2 Saturation: 100
O2 Saturation: 96
O2 Saturation: 97
O2 Saturation: 99
Operator id: 123161
Operator id: 137421
Operator id: 227661
Operator id: 3291
Patient temperature: 101.1
Patient temperature: 35.9
Patient temperature: 97.9
TCO2: 20
TCO2: 26
TCO2: 26
TCO2: 27
pCO2 arterial: 34.8 — ABNORMAL LOW
pCO2 arterial: 38.2
pCO2 arterial: 39.6
pCO2 arterial: 40.2
pH, Arterial: 7.351
pH, Arterial: 7.394
pH, Arterial: 7.415
pH, Arterial: 7.424
pO2, Arterial: 155 — ABNORMAL HIGH
pO2, Arterial: 297 — ABNORMAL HIGH
pO2, Arterial: 82
pO2, Arterial: 87

## 2011-09-08 LAB — POCT I-STAT 4, (NA,K, GLUC, HGB,HCT)
Glucose, Bld: 103 — ABNORMAL HIGH
Glucose, Bld: 105 — ABNORMAL HIGH
Glucose, Bld: 105 — ABNORMAL HIGH
Glucose, Bld: 109 — ABNORMAL HIGH
Glucose, Bld: 114 — ABNORMAL HIGH
Glucose, Bld: 118 — ABNORMAL HIGH
Glucose, Bld: 119 — ABNORMAL HIGH
Glucose, Bld: 129 — ABNORMAL HIGH
Glucose, Bld: 137 — ABNORMAL HIGH
Glucose, Bld: 147 — ABNORMAL HIGH
Glucose, Bld: 99
HCT: 22 — ABNORMAL LOW
HCT: 23 — ABNORMAL LOW
HCT: 24 — ABNORMAL LOW
HCT: 25 — ABNORMAL LOW
HCT: 25 — ABNORMAL LOW
HCT: 26 — ABNORMAL LOW
HCT: 26 — ABNORMAL LOW
HCT: 26 — ABNORMAL LOW
HCT: 30 — ABNORMAL LOW
HCT: 34 — ABNORMAL LOW
HCT: 38 — ABNORMAL LOW
Hemoglobin: 10.2 — ABNORMAL LOW
Hemoglobin: 11.6 — ABNORMAL LOW
Hemoglobin: 12.9 — ABNORMAL LOW
Hemoglobin: 7.5 — CL
Hemoglobin: 7.8 — CL
Hemoglobin: 8.2 — ABNORMAL LOW
Hemoglobin: 8.5 — ABNORMAL LOW
Hemoglobin: 8.5 — ABNORMAL LOW
Hemoglobin: 8.8 — ABNORMAL LOW
Hemoglobin: 8.8 — ABNORMAL LOW
Hemoglobin: 8.8 — ABNORMAL LOW
Operator id: 137421
Operator id: 3291
Operator id: 3291
Operator id: 3291
Operator id: 3291
Operator id: 3291
Operator id: 3291
Operator id: 3291
Operator id: 3291
Operator id: 3291
Operator id: 3291
Potassium: 3.6
Potassium: 3.8
Potassium: 4
Potassium: 4.1
Potassium: 4.1
Potassium: 4.3
Potassium: 4.4
Potassium: 4.7
Potassium: 4.7
Potassium: 4.8
Potassium: 5.1
Sodium: 133 — ABNORMAL LOW
Sodium: 133 — ABNORMAL LOW
Sodium: 134 — ABNORMAL LOW
Sodium: 135
Sodium: 136
Sodium: 137
Sodium: 138
Sodium: 139
Sodium: 139
Sodium: 140
Sodium: 140

## 2011-09-08 LAB — PREPARE FRESH FROZEN PLASMA

## 2011-09-08 LAB — CBC
HCT: 29.8 — ABNORMAL LOW
HCT: 31.1 — ABNORMAL LOW
HCT: 31.4 — ABNORMAL LOW
HCT: 31.4 — ABNORMAL LOW
HCT: 32.2 — ABNORMAL LOW
HCT: 32.3 — ABNORMAL LOW
HCT: 32.5 — ABNORMAL LOW
HCT: 44.7
Hemoglobin: 10.2 — ABNORMAL LOW
Hemoglobin: 10.6 — ABNORMAL LOW
Hemoglobin: 10.7 — ABNORMAL LOW
Hemoglobin: 11 — ABNORMAL LOW
Hemoglobin: 11.1 — ABNORMAL LOW
Hemoglobin: 11.2 — ABNORMAL LOW
Hemoglobin: 11.4 — ABNORMAL LOW
Hemoglobin: 15.2
MCHC: 34
MCHC: 34.1
MCHC: 34.1
MCHC: 34.2
MCHC: 34.4
MCHC: 34.5
MCHC: 34.9
MCHC: 35.3
MCV: 96.2
MCV: 96.3
MCV: 96.5
MCV: 96.7
MCV: 96.8
MCV: 96.9
MCV: 97.5
MCV: 98
Platelets: 102 — ABNORMAL LOW
Platelets: 106 — ABNORMAL LOW
Platelets: 109 — ABNORMAL LOW
Platelets: 117 — ABNORMAL LOW
Platelets: 132 — ABNORMAL LOW
Platelets: 176
Platelets: 178
Platelets: 94 — ABNORMAL LOW
RBC: 3.04 — ABNORMAL LOW
RBC: 3.19 — ABNORMAL LOW
RBC: 3.24 — ABNORMAL LOW
RBC: 3.25 — ABNORMAL LOW
RBC: 3.33 — ABNORMAL LOW
RBC: 3.35 — ABNORMAL LOW
RBC: 3.38 — ABNORMAL LOW
RBC: 4.64
RDW: 13.7
RDW: 13.8
RDW: 13.9
RDW: 14
RDW: 14.2
RDW: 14.2
RDW: 14.3
RDW: 14.4
WBC: 10.1
WBC: 11.3 — ABNORMAL HIGH
WBC: 11.7 — ABNORMAL HIGH
WBC: 13.1 — ABNORMAL HIGH
WBC: 13.4 — ABNORMAL HIGH
WBC: 14.5 — ABNORMAL HIGH
WBC: 5.5
WBC: 7.8

## 2011-09-08 LAB — BASIC METABOLIC PANEL
BUN: 12
BUN: 24 — ABNORMAL HIGH
BUN: 32 — ABNORMAL HIGH
BUN: 33 — ABNORMAL HIGH
BUN: 34 — ABNORMAL HIGH
BUN: 38 — ABNORMAL HIGH
BUN: 43 — ABNORMAL HIGH
CO2: 26
CO2: 27
CO2: 28
CO2: 28
CO2: 32
CO2: 33 — ABNORMAL HIGH
CO2: 34 — ABNORMAL HIGH
Calcium: 7.1 — ABNORMAL LOW
Calcium: 7.7 — ABNORMAL LOW
Calcium: 8 — ABNORMAL LOW
Calcium: 8.2 — ABNORMAL LOW
Calcium: 8.4
Calcium: 9
Calcium: 9.1
Chloride: 100
Chloride: 102
Chloride: 105
Chloride: 106
Chloride: 111
Chloride: 96
Chloride: 98
Creatinine, Ser: 1.3
Creatinine, Ser: 1.31
Creatinine, Ser: 1.33
Creatinine, Ser: 1.37
Creatinine, Ser: 1.41
Creatinine, Ser: 1.49
Creatinine, Ser: 1.51 — ABNORMAL HIGH
GFR calc Af Amer: 56 — ABNORMAL LOW
GFR calc Af Amer: 57 — ABNORMAL LOW
GFR calc Af Amer: >60
GFR calc Af Amer: >60
GFR calc Af Amer: >60
GFR calc Af Amer: >60
GFR calc Af Amer: >60
GFR calc non Af Amer: 47 — ABNORMAL LOW
GFR calc non Af Amer: 47 — ABNORMAL LOW
GFR calc non Af Amer: 50 — ABNORMAL LOW
GFR calc non Af Amer: 52 — ABNORMAL LOW
GFR calc non Af Amer: 54 — ABNORMAL LOW
GFR calc non Af Amer: 55 — ABNORMAL LOW
GFR calc non Af Amer: 55 — ABNORMAL LOW
Glucose, Bld: 108 — ABNORMAL HIGH
Glucose, Bld: 115 — ABNORMAL HIGH
Glucose, Bld: 126 — ABNORMAL HIGH
Glucose, Bld: 127 — ABNORMAL HIGH
Glucose, Bld: 131 — ABNORMAL HIGH
Glucose, Bld: 135 — ABNORMAL HIGH
Glucose, Bld: 149 — ABNORMAL HIGH
Potassium: 3 — ABNORMAL LOW
Potassium: 3.5
Potassium: 4.1
Potassium: 4.2
Potassium: 4.2
Potassium: 4.2
Potassium: 4.4
Sodium: 138
Sodium: 139
Sodium: 139
Sodium: 139
Sodium: 140
Sodium: 141
Sodium: 145

## 2011-09-08 LAB — HEMOGLOBIN A1C
Hgb A1c MFr Bld: 5.3
Mean Plasma Glucose: 111

## 2011-09-08 LAB — CREATININE, SERUM
Creatinine, Ser: 1.19
Creatinine, Ser: 1.42
GFR calc Af Amer: >60
GFR calc Af Amer: >60
GFR calc non Af Amer: 50 — ABNORMAL LOW
GFR calc non Af Amer: >60

## 2011-09-08 LAB — APTT
aPTT: 26
aPTT: 29

## 2011-09-08 LAB — MAGNESIUM
Magnesium: 2.4
Magnesium: 2.5
Magnesium: 2.8 — ABNORMAL HIGH

## 2011-09-08 LAB — URINALYSIS, ROUTINE W REFLEX MICROSCOPIC
Bilirubin Urine: NEGATIVE
Glucose, UA: NEGATIVE
Hgb urine dipstick: NEGATIVE
Ketones, ur: NEGATIVE
Nitrite: NEGATIVE
Protein, ur: NEGATIVE
Specific Gravity, Urine: 1.018
Urobilinogen, UA: 0.2
pH: 5.5

## 2011-09-08 LAB — PREPARE PLATELET PHERESIS

## 2011-09-08 LAB — I-STAT EC8
Acid-base deficit: 2
Acid-base deficit: 2
BUN: 15
BUN: 20
Bicarbonate: 23
Bicarbonate: 24
Chloride: 105
Chloride: 107
Glucose, Bld: 144 — ABNORMAL HIGH
Glucose, Bld: 150 — ABNORMAL HIGH
HCT: 30 — ABNORMAL LOW
HCT: 30 — ABNORMAL LOW
Hemoglobin: 10.2 — ABNORMAL LOW
Hemoglobin: 10.2 — ABNORMAL LOW
Operator id: 123161
Operator id: 148471
Potassium: 4.1
Potassium: 4.3
Sodium: 139
Sodium: 142
TCO2: 24
TCO2: 25
pCO2 arterial: 39.1
pCO2 arterial: 47.1 — ABNORMAL HIGH
pH, Arterial: 7.314 — ABNORMAL LOW
pH, Arterial: 7.378

## 2011-09-08 LAB — COMPREHENSIVE METABOLIC PANEL
ALT: 30
AST: 27
Albumin: 3.9
Alkaline Phosphatase: 75
BUN: 15
CO2: 23
Calcium: 9.2
Chloride: 107
Creatinine, Ser: 1.26
GFR calc Af Amer: >60
GFR calc non Af Amer: 58 — ABNORMAL LOW
Glucose, Bld: 103 — ABNORMAL HIGH
Potassium: 4.2
Sodium: 138
Total Bilirubin: 1.1
Total Protein: 6.9

## 2011-09-08 LAB — TYPE AND SCREEN
ABO/RH(D): A POS
Antibody Screen: NEGATIVE

## 2011-09-08 LAB — PROTIME-INR
INR: 1
INR: 1.4
INR: 1.4
INR: 1.4
INR: 1.4
INR: 1.4
Prothrombin Time: 13.6
Prothrombin Time: 17.1 — ABNORMAL HIGH
Prothrombin Time: 17.1 — ABNORMAL HIGH
Prothrombin Time: 17.2 — ABNORMAL HIGH
Prothrombin Time: 17.6 — ABNORMAL HIGH
Prothrombin Time: 17.6 — ABNORMAL HIGH

## 2011-09-08 LAB — HEMOGLOBIN AND HEMATOCRIT, BLOOD
HCT: 25.7 — ABNORMAL LOW
Hemoglobin: 8.9 — ABNORMAL LOW

## 2011-09-08 LAB — POCT I-STAT 3, VENOUS BLOOD GAS (G3P V)
Acid-base deficit: 1
Bicarbonate: 24.2 — ABNORMAL HIGH
O2 Saturation: 76
Operator id: 3291
TCO2: 25
pCO2, Ven: 39.7 — ABNORMAL LOW
pH, Ven: 7.393 — ABNORMAL HIGH
pO2, Ven: 41

## 2011-09-08 LAB — BLOOD GAS, ARTERIAL
Acid-Base Excess: 0.6
Bicarbonate: 24.6 — ABNORMAL HIGH
FIO2: 0.21
O2 Saturation: 96.5
Patient temperature: 98.6
TCO2: 25.8
pCO2 arterial: 38.7
pH, Arterial: 7.42
pO2, Arterial: 88.2

## 2011-09-08 LAB — ABO/RH: ABO/RH(D): A POS

## 2011-09-08 LAB — PLATELET COUNT: Platelets: 93 — ABNORMAL LOW

## 2011-09-11 LAB — PROTIME-INR
INR: 3.9 — ABNORMAL HIGH
Prothrombin Time: 39.4 — ABNORMAL HIGH

## 2011-09-11 LAB — CBC
HCT: 32.5 — ABNORMAL LOW
Hemoglobin: 10.9 — ABNORMAL LOW
MCHC: 33.5
MCV: 91.8
Platelets: 196
RBC: 3.54 — ABNORMAL LOW
RDW: 14.7
WBC: 7.6

## 2011-09-11 LAB — BASIC METABOLIC PANEL
BUN: 17
CO2: 28
Calcium: 9
Chloride: 103
Creatinine, Ser: 1.47
GFR calc Af Amer: 58 — ABNORMAL LOW
GFR calc non Af Amer: 48 — ABNORMAL LOW
Glucose, Bld: 102 — ABNORMAL HIGH
Potassium: 4.2
Sodium: 138

## 2011-09-11 LAB — APTT: aPTT: 53 — ABNORMAL HIGH

## 2011-09-12 ENCOUNTER — Ambulatory Visit (INDEPENDENT_AMBULATORY_CARE_PROVIDER_SITE_OTHER): Payer: Medicare Other | Admitting: *Deleted

## 2011-09-12 ENCOUNTER — Ambulatory Visit (INDEPENDENT_AMBULATORY_CARE_PROVIDER_SITE_OTHER): Payer: Medicare Other | Admitting: Cardiology

## 2011-09-12 ENCOUNTER — Encounter: Payer: Self-pay | Admitting: Internal Medicine

## 2011-09-12 ENCOUNTER — Encounter: Payer: Self-pay | Admitting: Cardiology

## 2011-09-12 ENCOUNTER — Other Ambulatory Visit: Payer: Self-pay | Admitting: Internal Medicine

## 2011-09-12 VITALS — BP 108/70 | HR 70 | Ht 68.0 in | Wt 199.0 lb

## 2011-09-12 DIAGNOSIS — I499 Cardiac arrhythmia, unspecified: Secondary | ICD-10-CM

## 2011-09-12 DIAGNOSIS — I10 Essential (primary) hypertension: Secondary | ICD-10-CM

## 2011-09-12 DIAGNOSIS — Z950 Presence of cardiac pacemaker: Secondary | ICD-10-CM

## 2011-09-12 DIAGNOSIS — E785 Hyperlipidemia, unspecified: Secondary | ICD-10-CM

## 2011-09-12 DIAGNOSIS — I2510 Atherosclerotic heart disease of native coronary artery without angina pectoris: Secondary | ICD-10-CM

## 2011-09-12 DIAGNOSIS — I4891 Unspecified atrial fibrillation: Secondary | ICD-10-CM

## 2011-09-12 LAB — PACEMAKER DEVICE OBSERVATION
AL AMPLITUDE: 3.1 mv
AL IMPEDENCE PM: 482 Ohm
AL THRESHOLD: 0.75 V
ATRIAL PACING PM: 90
BAMS-0001: 150 {beats}/min
BAMS-0003: 70 {beats}/min
BATTERY VOLTAGE: 2.76 V
DEVICE MODEL PM: 2109558
RV LEAD AMPLITUDE: 7.4 mv
RV LEAD IMPEDENCE PM: 624 Ohm
RV LEAD THRESHOLD: 0.625 V
VENTRICULAR PACING PM: 8.9

## 2011-09-12 LAB — PROTIME-INR
INR: 1.2
INR: 1.3
Prothrombin Time: 15.7 — ABNORMAL HIGH
Prothrombin Time: 16.6 — ABNORMAL HIGH

## 2011-09-12 LAB — APTT: aPTT: 27

## 2011-09-12 LAB — POCT INR: INR: 1.7

## 2011-09-12 NOTE — Assessment & Plan Note (Signed)
Blood pressure controlled. Continue present medications. Potassium and renal function monitored by primary care. 

## 2011-09-12 NOTE — Progress Notes (Signed)
PPM check 

## 2011-09-12 NOTE — Patient Instructions (Signed)
Your physician wants you to follow-up in: 6 MONTHS You will receive a reminder letter in the mail two months in advance. If you don't receive a letter, please call our office to schedule the follow-up appointment.   Your physician has requested that you have en exercise stress myoview. For further information please visit [email or web address] Please follow instruction sheet, as given. PRIOR TO APPT IN 6 MONTHS

## 2011-09-12 NOTE — Assessment & Plan Note (Signed)
Continue statin. Lipids and liver monitored by primary care. 

## 2011-09-12 NOTE — Assessment & Plan Note (Signed)
Continue multaq and coumadin. Patient not interested in pursuing xeralto or pradaxa for now. He asked about discontinuing multaq. I explained that we could potentially do this but he may be more likely to develop recurrent atrial fibrillation. He would like to continue for now.

## 2011-09-12 NOTE — Assessment & Plan Note (Signed)
Continue present medications.plan Myoview when he returns in 6 months.

## 2011-09-12 NOTE — Progress Notes (Signed)
WUJ:WJXBJYNW male with past medical history of coronary artery disease status post coronary bypass graft as well as Cox Maze for atrial fibrillation for fu. Patient had this procedure in January of 2009. He had CABG x3 (left internal mammary artery to distal left anterior descending coronary artery, saphenous vein graft to second circumflex marginal branch, saphenous vein graft to posterior descending coronary artery, and modified Cox- Maze IV procedure). He has had atrial fibrillation following his procedure and also a pacemaker placed. A Myoview was performed at [facility] heart and vascular in June of 2010. Ejection fraction was 67%. The perfusion was normal. Echocardiogram in September of 2011 revealed normal LV function, moderate right atrial and right ventricular enlargement, and grade 2 diastolic dysfunction. The aortic root was mildly dilated. Abdominal ultrasound in March of 2012 showed no aneurysm. I last saw him in March 2012. Since then, he occasionally feels a brief sharp chest pain that lasts one to 2 seconds. He otherwise does not have exertional chest pain. Mild dyspnea on exertion but no orthopnea, PND or syncope.   Current Outpatient Prescriptions  Medication Sig Dispense Refill  . aspirin 81 MG tablet Take 81 mg by mouth daily.        [name] dronedarone (MULTAQ) 400 MG tablet Take 400 mg by mouth 2 (two) times daily with a meal.        . furosemide (LASIX) 40 MG tablet Take 40 mg by mouth. Take 3-4 times per week, as needed       . metoprolol (LOPRESSOR) 50 MG tablet Take 50 mg by mouth 2 (two) times daily.        . Omega-3 Fatty Acids (MAREPA) 1000 MG CAPS Take by mouth. 3 daily       . potassium chloride SA (K-DUR,KLOR-CON) 20 MEQ tablet Take 20 mEq by mouth daily.        . rosuvastatin (CRESTOR) 10 MG tablet Take 10 mg by mouth daily.        . sertraline (ZOLOFT) 100 MG tablet Take 100 mg by mouth. Take 1 & 1/2 tablets daily       . Tamsulosin HCl (FLOMAX) 0.4 MG CAPS Take by mouth at  bedtime.       [name] warfarin (COUMADIN) 5 MG tablet Take by mouth as directed.           Past Medical History  Diagnosis Date  . Pacemaker     PPM - St. Jude  . Atrial fibrillation   . CAD (coronary artery disease), native coronary artery   . DM (diabetes mellitus)   . Hyperlipidemia type II   . OSA (obstructive sleep apnea)   . BPH (benign prostatic hyperplasia)     Past Surgical History  Procedure Date  . Foot surgery   . Cholecystectomy   . Appendectomy   . Orchiectomy     Left  /  testicular cancer  . Hernia repair   . Coronary artery bypass graft     x3 (left internal mammary artery to distal left anterior descending coronary artery, saphenous vain graft to second circumflex marginal branch, saphenous vain graft to posterior descending coronary artery, endoscopic saphenous vain harvest from right thigh) and modified Cox - Maze IV procedure.  [name]. [name],MD. Electronically signed CHO/MEDQ D: 01/09/2008/ JOB: [id number] cc:  Antionette Char MD  . Pacemaker placement     PPM - St. Jude    History   Social History  . Marital Status: Married    Spouse Name:  N/A    Number of Children: N/A  . Years of Education: N/A   Occupational History  . Not on file.   Social History Main Topics  . Smoking status: Former Smoker    Quit date: 02/21/1991  . Smokeless tobacco: Not on file  . Alcohol Use: No  . Drug Use: No  . Sexually Active: Not on file   Other Topics Concern  . 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  He is a Emergency planning/management officer.      ROS: no fevers or chills, productive cough, hemoptysis, dysphasia, odynophagia, melena, hematochezia, dysuria, hematuria, rash, seizure activity, orthopnea, PND, pedal edema, claudication. Remaining systems are negative.  Physical Exam: Well-developed well-nourished in no acute distress.  Skin is warm and dry.  HEENT is normal.  Neck is supple. No thyromegaly.  Chest is clear to auscultation with  normal expansion.  Cardiovascular exam is regular rate and rhythm.  Abdominal exam nontender or distended. No masses palpated. Extremities show no edema. neuro grossly intact  ECG Atrial paced rhythm, RBBB, prolonged QT

## 2011-09-12 NOTE — Assessment & Plan Note (Signed)
Management per electrophysiology. 

## 2011-09-13 ENCOUNTER — Encounter: Payer: Medicare Other | Admitting: *Deleted

## 2011-09-19 LAB — GLUCOSE, CAPILLARY: Glucose-Capillary: 93

## 2011-09-26 ENCOUNTER — Ambulatory Visit (INDEPENDENT_AMBULATORY_CARE_PROVIDER_SITE_OTHER): Payer: Medicare Other | Admitting: *Deleted

## 2011-09-26 DIAGNOSIS — I4891 Unspecified atrial fibrillation: Secondary | ICD-10-CM

## 2011-09-26 LAB — POCT INR: INR: 2.1

## 2011-10-05 ENCOUNTER — Other Ambulatory Visit: Payer: Self-pay | Admitting: Cardiology

## 2011-10-06 ENCOUNTER — Other Ambulatory Visit: Payer: Self-pay

## 2011-10-06 MED ORDER — DRONEDARONE HCL 400 MG PO TABS: 400.0000 mg | ORAL_TABLET | Freq: Two times a day (BID) | ORAL | Status: DC

## 2011-10-13 ENCOUNTER — Telehealth: Payer: Self-pay | Admitting: Cardiology

## 2011-10-13 MED ORDER — DRONEDARONE HCL 400 MG PO TABS: 400.0000 mg | ORAL_TABLET | Freq: Two times a day (BID) | ORAL | Status: DC

## 2011-10-13 NOTE — Telephone Encounter (Signed)
Pt wants refill of multaq called to [facility] he is out of meds please call when done

## 2011-10-16 ENCOUNTER — Ambulatory Visit (INDEPENDENT_AMBULATORY_CARE_PROVIDER_SITE_OTHER): Payer: Medicare Other | Admitting: *Deleted

## 2011-10-16 DIAGNOSIS — I4891 Unspecified atrial fibrillation: Secondary | ICD-10-CM

## 2011-10-16 LAB — POCT INR: INR: 1.6

## 2011-10-30 ENCOUNTER — Ambulatory Visit (INDEPENDENT_AMBULATORY_CARE_PROVIDER_SITE_OTHER): Payer: Medicare Other | Admitting: *Deleted

## 2011-10-30 DIAGNOSIS — I4891 Unspecified atrial fibrillation: Secondary | ICD-10-CM

## 2011-10-30 LAB — POCT INR: INR: 2

## 2011-11-22 ENCOUNTER — Ambulatory Visit (INDEPENDENT_AMBULATORY_CARE_PROVIDER_SITE_OTHER): Payer: Medicare Other | Admitting: *Deleted

## 2011-11-22 DIAGNOSIS — Z7901 Long term (current) use of anticoagulants: Secondary | ICD-10-CM

## 2011-11-22 DIAGNOSIS — I4891 Unspecified atrial fibrillation: Secondary | ICD-10-CM

## 2011-11-22 LAB — POCT INR: INR: 1.8

## 2011-12-14 ENCOUNTER — Encounter: Payer: Medicare Other | Admitting: *Deleted

## 2011-12-20 ENCOUNTER — Ambulatory Visit (INDEPENDENT_AMBULATORY_CARE_PROVIDER_SITE_OTHER): Payer: Medicare Other | Admitting: *Deleted

## 2011-12-20 DIAGNOSIS — I4891 Unspecified atrial fibrillation: Secondary | ICD-10-CM | POA: Diagnosis not present

## 2011-12-20 DIAGNOSIS — Z7901 Long term (current) use of anticoagulants: Secondary | ICD-10-CM | POA: Diagnosis not present

## 2011-12-20 LAB — POCT INR: INR: 2.2

## 2011-12-21 DIAGNOSIS — N200 Calculus of kidney: Secondary | ICD-10-CM | POA: Diagnosis not present

## 2011-12-21 DIAGNOSIS — E7800 Pure hypercholesterolemia, unspecified: Secondary | ICD-10-CM | POA: Diagnosis not present

## 2011-12-21 DIAGNOSIS — N401 Enlarged prostate with lower urinary tract symptoms: Secondary | ICD-10-CM | POA: Diagnosis not present

## 2011-12-21 DIAGNOSIS — N138 Other obstructive and reflux uropathy: Secondary | ICD-10-CM | POA: Diagnosis not present

## 2011-12-21 DIAGNOSIS — Z79899 Other long term (current) drug therapy: Secondary | ICD-10-CM | POA: Diagnosis not present

## 2012-01-10 ENCOUNTER — Ambulatory Visit (INDEPENDENT_AMBULATORY_CARE_PROVIDER_SITE_OTHER): Payer: Medicare Other | Admitting: Cardiology

## 2012-01-10 ENCOUNTER — Encounter: Payer: Self-pay | Admitting: Cardiology

## 2012-01-10 ENCOUNTER — Ambulatory Visit (INDEPENDENT_AMBULATORY_CARE_PROVIDER_SITE_OTHER): Payer: Medicare Other | Admitting: *Deleted

## 2012-01-10 DIAGNOSIS — I10 Essential (primary) hypertension: Secondary | ICD-10-CM

## 2012-01-10 DIAGNOSIS — I4891 Unspecified atrial fibrillation: Secondary | ICD-10-CM | POA: Diagnosis not present

## 2012-01-10 DIAGNOSIS — E785 Hyperlipidemia, unspecified: Secondary | ICD-10-CM

## 2012-01-10 DIAGNOSIS — I2510 Atherosclerotic heart disease of native coronary artery without angina pectoris: Secondary | ICD-10-CM | POA: Diagnosis not present

## 2012-01-10 DIAGNOSIS — Z950 Presence of cardiac pacemaker: Secondary | ICD-10-CM

## 2012-01-10 DIAGNOSIS — Z7901 Long term (current) use of anticoagulants: Secondary | ICD-10-CM

## 2012-01-10 LAB — POCT INR: INR: 3.6

## 2012-01-10 NOTE — Assessment & Plan Note (Signed)
Blood pressure controlled. Continue present medications. 

## 2012-01-10 NOTE — Assessment & Plan Note (Signed)
Continue present medications including Coumadin.

## 2012-01-10 NOTE — Assessment & Plan Note (Signed)
Continue statin. 

## 2012-01-10 NOTE — Progress Notes (Signed)
HPI: Pleasant male with past medical history of coronary artery disease status post coronary bypass graft as well as Cox Maze for atrial fibrillation for fu. Patient had this procedure in January of 2009. He had CABG x3 (left internal mammary artery to distal left anterior descending coronary artery, saphenous vein graft to second circumflex marginal branch, saphenous vein graft to posterior descending coronary artery, and modified Cox- Maze IV procedure). He has had atrial fibrillation following his procedure and also a pacemaker placed. A Myoview was performed at [facility] heart and vascular in June of 2010. Ejection fraction was 67%. The perfusion was normal. Echocardiogram in September of 2011 revealed normal LV function, moderate right atrial and right ventricular enlargement, and grade 2 diastolic dysfunction. The aortic root was mildly dilated. Abdominal ultrasound in March of 2012 showed no aneurysm. I last saw him in Sept 2012. Since then, patient was in [place] and developed acute nausea/vomiting/dizziness/weakness and confusion. He was admitted to a hospital there and cardiac markers were negative. An echocardiogram showed an ejection fraction of 55%, mild biatrial enlargement, mild mitral regurgitation, moderate tricuspid regurgitation. Patient was felt to have dehydration and possible viral illness. He has improved and now denies dyspnea, chest pain or syncope.   Current Outpatient Prescriptions  Medication Sig Dispense Refill  . aspirin 81 MG tablet Take 81 mg by mouth daily.        [name] dronedarone (MULTAQ) 400 MG tablet Take 1 tablet (400 mg total) by mouth 2 (two) times daily with a meal.  60 tablet  12  . furosemide (LASIX) 40 MG tablet Take 40 mg by mouth. Take 3-4 times per week, as needed       . metoprolol (LOPRESSOR) 50 MG tablet Take 50 mg by mouth 2 (two) times daily.        . Omega-3 Fatty Acids (MAREPA) 1000 MG CAPS Take by mouth. 3 daily       . potassium chloride SA  (K-DUR,KLOR-CON) 20 MEQ tablet Take 20 mEq by mouth daily.        . rosuvastatin (CRESTOR) 10 MG tablet Take 10 mg by mouth daily.        . sertraline (ZOLOFT) 100 MG tablet Take 100 mg by mouth. Take 1 & 1/2 tablets daily       . Tamsulosin HCl (FLOMAX) 0.4 MG CAPS Take by mouth at bedtime.       [name] warfarin (COUMADIN) 5 MG tablet TAKE AS DIRECTED BY ANTICOAGULATION CLINIC  135 tablet  1     Past Medical History  Diagnosis Date  . Pacemaker     PPM - St. Jude  . Atrial fibrillation   . CAD (coronary artery disease), native coronary artery   . DM (diabetes mellitus)   . Hyperlipidemia type II   . OSA (obstructive sleep apnea)   . BPH (benign prostatic hyperplasia)     Past Surgical History  Procedure Date  . Foot surgery   . Cholecystectomy   . Appendectomy   . Orchiectomy     Left  /  testicular cancer  . Hernia repair   . Coronary artery bypass graft     x3 (left internal mammary artery to distal left anterior descending coronary artery, saphenous vain graft to second circumflex marginal branch, saphenous vain graft to posterior descending coronary artery, endoscopic saphenous vain harvest from right thigh) and modified Cox - Maze IV procedure.  [name]. [name],MD. Electronically signed CHO/MEDQ D: 01/09/2008/ JOB: [id number] cc:  Aram Candela  Tysinger MD  . Pacemaker placement     PPM - St. Jude    History   Social History  . Marital Status: Married    Spouse Name: N/A    Number of Children: N/A  . Years of Education: N/A   Occupational History  . Not on file.   Social History Main Topics  . Smoking status: Former Smoker    Quit date: 02/21/1991  . Smokeless tobacco: Not on file  . Alcohol Use: No  . Drug Use: No  . Sexually Active: Not on file   Other Topics Concern  . 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  He is a Emergency planning/management officer.      ROS: no fevers or chills, productive cough, hemoptysis, dysphasia, odynophagia, melena,  hematochezia, dysuria, hematuria, rash, seizure activity, orthopnea, PND, pedal edema, claudication. Remaining systems are negative.  Physical Exam: Well-developed well-nourished in no acute distress.  Skin is warm and dry.  HEENT is normal.  Neck is supple. No thyromegaly.  Chest is clear to auscultation with normal expansion.  Cardiovascular exam is regular rate and rhythm.  Abdominal exam nontender or distended. No masses palpated. Extremities show no edema. neuro grossly intact  ECG atrial paced rhythm, right bundle branch block, nonspecific inferior T-wave changes.

## 2012-01-10 NOTE — Assessment & Plan Note (Signed)
Management per electrophysiology. 

## 2012-01-10 NOTE — Assessment & Plan Note (Signed)
Plan continue statin. Discontinue aspirin and Coumadin use. Scheduled Myoview for risk stratification.

## 2012-01-10 NOTE — Patient Instructions (Signed)
Your physician wants you to follow-up in: 6 MONTHS You will receive a reminder letter in the mail two months in advance. If you don't receive a letter, please call our office to schedule the follow-up appointment.  Your physician has requested that you have a lexiscan myoview. For further information please visit [email or web address]. Please follow instruction sheet, as given.  

## 2012-01-11 ENCOUNTER — Telehealth: Payer: Self-pay | Admitting: *Deleted

## 2012-01-11 NOTE — Telephone Encounter (Signed)
Per dr Jens Som, pt called and instructed to stop his aspirin.

## 2012-01-17 ENCOUNTER — Ambulatory Visit (HOSPITAL_COMMUNITY): Payer: Medicare Other | Attending: Cardiology | Admitting: Radiology

## 2012-01-17 DIAGNOSIS — R0609 Other forms of dyspnea: Secondary | ICD-10-CM | POA: Insufficient documentation

## 2012-01-17 DIAGNOSIS — R42 Dizziness and giddiness: Secondary | ICD-10-CM | POA: Diagnosis not present

## 2012-01-17 DIAGNOSIS — I2510 Atherosclerotic heart disease of native coronary artery without angina pectoris: Secondary | ICD-10-CM | POA: Diagnosis not present

## 2012-01-17 DIAGNOSIS — R0989 Other specified symptoms and signs involving the circulatory and respiratory systems: Secondary | ICD-10-CM | POA: Diagnosis not present

## 2012-01-17 DIAGNOSIS — E785 Hyperlipidemia, unspecified: Secondary | ICD-10-CM | POA: Insufficient documentation

## 2012-01-17 DIAGNOSIS — E860 Dehydration: Secondary | ICD-10-CM | POA: Diagnosis not present

## 2012-01-17 DIAGNOSIS — I4510 Unspecified right bundle-branch block: Secondary | ICD-10-CM | POA: Insufficient documentation

## 2012-01-17 DIAGNOSIS — I10 Essential (primary) hypertension: Secondary | ICD-10-CM | POA: Diagnosis not present

## 2012-01-17 DIAGNOSIS — R5381 Other malaise: Secondary | ICD-10-CM | POA: Diagnosis not present

## 2012-01-17 DIAGNOSIS — R112 Nausea with vomiting, unspecified: Secondary | ICD-10-CM | POA: Insufficient documentation

## 2012-01-17 DIAGNOSIS — I4891 Unspecified atrial fibrillation: Secondary | ICD-10-CM

## 2012-01-17 DIAGNOSIS — E7801 Familial hypercholesterolemia: Secondary | ICD-10-CM

## 2012-01-17 DIAGNOSIS — Z87891 Personal history of nicotine dependence: Secondary | ICD-10-CM | POA: Insufficient documentation

## 2012-01-17 DIAGNOSIS — R0602 Shortness of breath: Secondary | ICD-10-CM

## 2012-01-17 DIAGNOSIS — F29 Unspecified psychosis not due to a substance or known physiological condition: Secondary | ICD-10-CM | POA: Diagnosis not present

## 2012-01-17 DIAGNOSIS — E119 Type 2 diabetes mellitus without complications: Secondary | ICD-10-CM

## 2012-01-17 DIAGNOSIS — Z950 Presence of cardiac pacemaker: Secondary | ICD-10-CM | POA: Insufficient documentation

## 2012-01-17 DIAGNOSIS — Z8673 Personal history of transient ischemic attack (TIA), and cerebral infarction without residual deficits: Secondary | ICD-10-CM | POA: Diagnosis not present

## 2012-01-17 DIAGNOSIS — R5383 Other fatigue: Secondary | ICD-10-CM | POA: Insufficient documentation

## 2012-01-17 MED ORDER — REGADENOSON 0.4 MG/5ML IV SOLN
0.4000 mg | Freq: Once | INTRAVENOUS | Status: AC
  Administered 2012-01-17: 0.4 mg via INTRAVENOUS

## 2012-01-17 MED ORDER — TECHNETIUM TC 99M TETROFOSMIN IV KIT
10.0000 | PACK | Freq: Once | INTRAVENOUS | Status: AC | PRN
  Administered 2012-01-17: 10 via INTRAVENOUS

## 2012-01-17 MED ORDER — TECHNETIUM TC 99M TETROFOSMIN IV KIT
30.0000 | PACK | Freq: Once | INTRAVENOUS | Status: AC | PRN
  Administered 2012-01-17: 30 via INTRAVENOUS

## 2012-01-17 NOTE — Progress Notes (Signed)
[facility] SITE 3 NUCLEAR MED [address] [place] [address] [phone]  Cardiology Nuclear Med Study  [name] is a 71 y.o. male [id number] June 04, 1941   Nuclear Med Background Indication for Stress Test:  Evaluation for Ischemia, Graft Patency and 12/13/11-12/17/11 [facility]): Nausea/vomitting/dizziness,confusion secondary to dehydration, (-) enzymes History:  1/09 Heart Catheterization-3 vessel Disease with persistent AFIB> CABG x 3 and Cox Maze procedure (AFIB) EF=50%,4/09 Pacemaker (SSS, AFIB), 6/10 Myocardial Perfusion Study-NL perfusion, EF=67% ([facility]), 9/11 Echo: EF=NL, Recent Echo [facility]) EF=55% Cardiac Risk Factors: History of Smoking, Hypertension, Lipids, NIDDM, RBBB and TIA  Symptoms:  DOE and Fatigue with Exertion   Nuclear Pre-Procedure Caffeine/Decaff Intake:  None NPO After: 7:00pm   Lungs:  Clear IV 0.9% NS with Angio Cath:  20g  IV Site: R Antecubital  IV Started by:  Stanton Kidney, EMT-P  Chest Size (in):  46 Cup Size: n/a  Height: 5' 8.5" (1.74 m)  Weight:  214 lb (97.07 kg)  BMI:  Body mass index is 32.07 kg/(m^2). Tech Comments:  Metoprolol last taken last night, per patient.    Nuclear Med Study 1 or 2 day study: 1 day  Stress Test Type:  Eugenie Birks  Reading MD: Marca Ancona, MD  Order Authorizing Provider:  Olga Millers, MD  Resting Radionuclide: Technetium 61m Tetrofosmin  Resting Radionuclide Dose: 11.0 mCi   Stress Radionuclide:  Technetium 50m Tetrofosmin  Stress Radionuclide Dose: 33.0 mCi           Stress Protocol Rest HR: 69 Stress HR: 70  Rest BP: 112/84 Stress BP: 113/72  Exercise Time (min): n/a METS: n/a   Predicted Max HR: 151 bpm % Max HR: 46.36 bpm Rate Pressure Product: 7910   Dose of Adenosine (mg):  n/a Dose of Lexiscan: 0.4 mg  Dose of Atropine (mg): n/a Dose of Dobutamine: n/a mcg/kg/min (at max HR)  Stress Test Technologist: Irean Hong, RN  Nuclear Technologist:   Domenic Polite, CNMT     Rest Procedure:  Myocardial perfusion imaging was performed at rest 45 minutes following the intravenous administration of Technetium 41m Tetrofosmin. Rest ECG: Atrial Pacing with 1st AVB, RBBB with Nonspecific T changes  Stress Procedure:  The patient received IV Lexiscan 0.4 mg over 15-seconds.  Technetium 43m Tetrofosmin injected at 30-seconds.  There were no significant changes with Lexiscan. There were occasional PVC, rare PAC and PVC couplet x 1. Quantitative spect images were obtained after a 45 minute delay. Stress ECG: No significant change from baseline ECG  QPS Raw Data Images:  Normal; no motion artifact; normal heart/lung ratio. Stress Images:  Normal homogeneous uptake in all areas of the myocardium. Rest Images:  Normal homogeneous uptake in all areas of the myocardium. Subtraction (SDS):  There is no evidence of scar or ischemia. Transient Ischemic Dilatation (Normal <1.22):  1.04 Lung/Heart Ratio (Normal <0.45):  0.47  Quantitative Gated Spect Images QGS EDV:  80 ml QGS ESV:  27 ml QGS cine images:  NL LV Function; NL Wall Motion QGS EF: 66%  Impression Exercise Capacity:  Lexiscan with no exercise. BP Response:  Normal blood pressure response. Clinical Symptoms:  Warmth ECG Impression:  RBBB, no change with infusion.  Comparison with Prior Nuclear Study: No images to compare  Overall Impression:  Normal stress nuclear study.  D[place]

## 2012-01-24 ENCOUNTER — Ambulatory Visit (INDEPENDENT_AMBULATORY_CARE_PROVIDER_SITE_OTHER): Payer: Medicare Other | Admitting: Pharmacist

## 2012-01-24 DIAGNOSIS — Z7901 Long term (current) use of anticoagulants: Secondary | ICD-10-CM | POA: Diagnosis not present

## 2012-01-24 DIAGNOSIS — I4891 Unspecified atrial fibrillation: Secondary | ICD-10-CM

## 2012-01-24 LAB — POCT INR: INR: 3.3

## 2012-01-31 DIAGNOSIS — I10 Essential (primary) hypertension: Secondary | ICD-10-CM | POA: Diagnosis not present

## 2012-01-31 DIAGNOSIS — E119 Type 2 diabetes mellitus without complications: Secondary | ICD-10-CM | POA: Diagnosis not present

## 2012-01-31 DIAGNOSIS — E7800 Pure hypercholesterolemia, unspecified: Secondary | ICD-10-CM | POA: Diagnosis not present

## 2012-01-31 DIAGNOSIS — Z0000 Encounter for general adult medical examination without abnormal findings: Secondary | ICD-10-CM | POA: Diagnosis not present

## 2012-02-02 DIAGNOSIS — R269 Unspecified abnormalities of gait and mobility: Secondary | ICD-10-CM | POA: Diagnosis not present

## 2012-02-05 DIAGNOSIS — R269 Unspecified abnormalities of gait and mobility: Secondary | ICD-10-CM | POA: Diagnosis not present

## 2012-02-07 ENCOUNTER — Ambulatory Visit (INDEPENDENT_AMBULATORY_CARE_PROVIDER_SITE_OTHER): Payer: Medicare Other | Admitting: *Deleted

## 2012-02-07 DIAGNOSIS — Z7901 Long term (current) use of anticoagulants: Secondary | ICD-10-CM | POA: Diagnosis not present

## 2012-02-07 DIAGNOSIS — I4891 Unspecified atrial fibrillation: Secondary | ICD-10-CM | POA: Diagnosis not present

## 2012-02-07 LAB — POCT INR: INR: 2.7

## 2012-02-08 DIAGNOSIS — R269 Unspecified abnormalities of gait and mobility: Secondary | ICD-10-CM | POA: Diagnosis not present

## 2012-02-15 DIAGNOSIS — R269 Unspecified abnormalities of gait and mobility: Secondary | ICD-10-CM | POA: Diagnosis not present

## 2012-02-20 DIAGNOSIS — R269 Unspecified abnormalities of gait and mobility: Secondary | ICD-10-CM | POA: Diagnosis not present

## 2012-02-22 DIAGNOSIS — R269 Unspecified abnormalities of gait and mobility: Secondary | ICD-10-CM | POA: Diagnosis not present

## 2012-02-23 DIAGNOSIS — D509 Iron deficiency anemia, unspecified: Secondary | ICD-10-CM | POA: Diagnosis not present

## 2012-02-27 DIAGNOSIS — R269 Unspecified abnormalities of gait and mobility: Secondary | ICD-10-CM | POA: Diagnosis not present

## 2012-02-29 DIAGNOSIS — R269 Unspecified abnormalities of gait and mobility: Secondary | ICD-10-CM | POA: Diagnosis not present

## 2012-03-06 ENCOUNTER — Ambulatory Visit (INDEPENDENT_AMBULATORY_CARE_PROVIDER_SITE_OTHER): Payer: Medicare Other | Admitting: *Deleted

## 2012-03-06 DIAGNOSIS — Z7901 Long term (current) use of anticoagulants: Secondary | ICD-10-CM | POA: Diagnosis not present

## 2012-03-06 DIAGNOSIS — I4891 Unspecified atrial fibrillation: Secondary | ICD-10-CM

## 2012-03-06 LAB — POCT INR: INR: 2.2

## 2012-03-07 DIAGNOSIS — R269 Unspecified abnormalities of gait and mobility: Secondary | ICD-10-CM | POA: Diagnosis not present

## 2012-03-08 DIAGNOSIS — R269 Unspecified abnormalities of gait and mobility: Secondary | ICD-10-CM | POA: Diagnosis not present

## 2012-03-08 DIAGNOSIS — E119 Type 2 diabetes mellitus without complications: Secondary | ICD-10-CM | POA: Diagnosis not present

## 2012-03-12 DIAGNOSIS — R269 Unspecified abnormalities of gait and mobility: Secondary | ICD-10-CM | POA: Diagnosis not present

## 2012-03-12 DIAGNOSIS — E669 Obesity, unspecified: Secondary | ICD-10-CM | POA: Diagnosis not present

## 2012-03-12 DIAGNOSIS — G4733 Obstructive sleep apnea (adult) (pediatric): Secondary | ICD-10-CM | POA: Diagnosis not present

## 2012-03-12 DIAGNOSIS — F3289 Other specified depressive episodes: Secondary | ICD-10-CM | POA: Diagnosis not present

## 2012-03-12 DIAGNOSIS — F329 Major depressive disorder, single episode, unspecified: Secondary | ICD-10-CM | POA: Diagnosis not present

## 2012-03-13 DIAGNOSIS — R269 Unspecified abnormalities of gait and mobility: Secondary | ICD-10-CM | POA: Diagnosis not present

## 2012-03-15 DIAGNOSIS — R269 Unspecified abnormalities of gait and mobility: Secondary | ICD-10-CM | POA: Diagnosis not present

## 2012-03-22 ENCOUNTER — Encounter: Payer: Self-pay | Admitting: Cardiology

## 2012-04-03 ENCOUNTER — Ambulatory Visit (INDEPENDENT_AMBULATORY_CARE_PROVIDER_SITE_OTHER): Payer: Medicare Other | Admitting: *Deleted

## 2012-04-03 DIAGNOSIS — I4891 Unspecified atrial fibrillation: Secondary | ICD-10-CM

## 2012-04-03 DIAGNOSIS — Z7901 Long term (current) use of anticoagulants: Secondary | ICD-10-CM | POA: Diagnosis not present

## 2012-04-03 LAB — POCT INR: INR: 2.6

## 2012-04-19 ENCOUNTER — Other Ambulatory Visit: Payer: Self-pay | Admitting: Cardiology

## 2012-04-19 MED ORDER — WARFARIN SODIUM 5 MG PO TABS: 5.0000 mg | ORAL_TABLET | ORAL | Status: DC

## 2012-04-19 MED ORDER — METOPROLOL TARTRATE 50 MG PO TABS: 50.0000 mg | ORAL_TABLET | Freq: Two times a day (BID) | ORAL | Status: DC

## 2012-04-19 NOTE — Telephone Encounter (Signed)
Rx refill - Metoprolol (LOPRESSOR) 50 MG tablet              - warfarin (COUMADIN) 5 MG tablet  Verified [facility], Mail order    Patient recvd call from [facility]  Stating need authorization for the med refill request listed above. Please return authorization to fax# [phone] to expedite refill

## 2012-05-14 ENCOUNTER — Encounter: Payer: Self-pay | Admitting: Internal Medicine

## 2012-05-14 ENCOUNTER — Ambulatory Visit (INDEPENDENT_AMBULATORY_CARE_PROVIDER_SITE_OTHER): Payer: Medicare Other | Admitting: *Deleted

## 2012-05-14 ENCOUNTER — Ambulatory Visit (INDEPENDENT_AMBULATORY_CARE_PROVIDER_SITE_OTHER): Payer: Medicare Other | Admitting: Internal Medicine

## 2012-05-14 VITALS — BP 128/76 | Ht 68.0 in | Wt 221.0 lb

## 2012-05-14 DIAGNOSIS — Z950 Presence of cardiac pacemaker: Secondary | ICD-10-CM

## 2012-05-14 DIAGNOSIS — I4891 Unspecified atrial fibrillation: Secondary | ICD-10-CM

## 2012-05-14 DIAGNOSIS — I499 Cardiac arrhythmia, unspecified: Secondary | ICD-10-CM

## 2012-05-14 DIAGNOSIS — Z7901 Long term (current) use of anticoagulants: Secondary | ICD-10-CM

## 2012-05-14 LAB — PACEMAKER DEVICE OBSERVATION
AL AMPLITUDE: 3.1 mv
AL IMPEDENCE PM: 476 Ohm
AL THRESHOLD: 0.75 V
ATRIAL PACING PM: 95
BAMS-0001: 150 {beats}/min
BAMS-0003: 70 {beats}/min
BATTERY VOLTAGE: 2.78 V
DEVICE MODEL PM: 2109558
RV LEAD AMPLITUDE: 9.6 mv
RV LEAD IMPEDENCE PM: 616 Ohm
RV LEAD THRESHOLD: 0.625 V
VENTRICULAR PACING PM: 8.4

## 2012-05-14 LAB — POCT INR: INR: 3.9

## 2012-05-14 NOTE — Patient Instructions (Signed)
Your physician wants you to follow-up in: 6 months in device clinic and 12 months with Dr Taylor You will receive a reminder letter in the mail two months in advance. If you don't receive a letter, please call our office to schedule the follow-up appointment.  

## 2012-05-14 NOTE — Assessment & Plan Note (Signed)
His device is working normally. We'll plan to recheck in several months. 

## 2012-05-14 NOTE — Assessment & Plan Note (Signed)
He is maintaining sinus rhythm very nicely. He will continue his current medical therapy. 

## 2012-05-14 NOTE — Progress Notes (Signed)
HPI [name] returns today for followup. He is a very pleasant 71 year old man with paroxysmal atrial fibrillation, symptomatic bradycardia, status post permanent pacemaker insertion. The patient has been well-controlled from an arrhythmia perspective on Dronenderone. He denies chest pain, shortness of breath, or peripheral edema. No Known Allergies   Current Outpatient Prescriptions  Medication Sig Dispense Refill  . dronedarone (MULTAQ) 400 MG tablet Take 1 tablet (400 mg total) by mouth 2 (two) times daily with a meal.  60 tablet  12  . furosemide (LASIX) 40 MG tablet Take 40 mg by mouth. Take 3-4 times per week, as needed       . metoprolol (LOPRESSOR) 50 MG tablet Take 1 tablet (50 mg total) by mouth 2 (two) times daily.  180 tablet  2  . Omega-3 Fatty Acids (MAREPA) 1000 MG CAPS Take by mouth. 3 daily       . potassium chloride SA (K-DUR,KLOR-CON) 20 MEQ tablet Take 20 mEq by mouth daily.        . rosuvastatin (CRESTOR) 10 MG tablet Take 10 mg by mouth daily.        . sertraline (ZOLOFT) 100 MG tablet Take 100 mg by mouth. Take 1 & 1/2 tablets daily       . Tamsulosin HCl (FLOMAX) 0.4 MG CAPS Take by mouth at bedtime.       . VESICARE 5 MG tablet Take 5 mg by mouth as directed.       . warfarin (COUMADIN) 5 MG tablet Take 1 tablet (5 mg total) by mouth as directed. Take as directed by coumadin clinic, this is a 90 supply  135 tablet  1     Past Medical History  Diagnosis Date  . Pacemaker     PPM - St. Jude  . Atrial fibrillation   . CAD (coronary artery disease), native coronary artery   . DM (diabetes mellitus)   . Hyperlipidemia type II   . OSA (obstructive sleep apnea)   . BPH (benign prostatic hyperplasia)     ROS:   All systems reviewed and negative except as noted in the HPI.   Past Surgical History  Procedure Date  . Foot surgery   . Cholecystectomy   . Appendectomy   . Orchiectomy     Left  /  testicular cancer  . Hernia repair   . Coronary artery bypass  graft     x3 (left internal mammary artery to distal left anterior descending coronary artery, saphenous vain graft to second circumflex marginal branch, saphenous vain graft to posterior descending coronary artery, endoscopic saphenous vain harvest from right thigh) and modified Cox - Maze IV procedure.  [name]. [name],MD. Electronically signed CHO/MEDQ D: 01/09/2008/ JOB: [id number] cc:  Jamie Char MD  . Pacemaker placement     PPM - St. Jude     Family History  Problem Relation Age of Onset  . Heart disease Father   . Heart attack Father      History   Social History  . Marital Status: Married    Spouse Name: N/A    Number of Children: N/A  . Years of Education: N/A   Occupational History  . Not on file.   Social History Main Topics  . Smoking status: Former Smoker    Quit date: 02/21/1991  . Smokeless tobacco: Not on file  . Alcohol Use: No  . Drug Use: No  . Sexually Active: Not on file   Other Topics Concern  .  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  He is a Emergency planning/management officer.       BP 128/76  Ht 5\' 8"  (1.727 m)  Wt 221 lb (100.245 kg)  BMI 33.60 kg/m2  Physical Exam:  Well appearing 71 year old man, NAD HEENT: Unremarkable Neck:  No JVD, no thyromegally Lungs:  Clear with no wheezes, rales, or rhonchi. HEART:  Regular rate rhythm, no murmurs, no rubs, no clicks Abd:  soft, positive bowel sounds, no organomegally, no rebound, no guarding Ext:  2 plus pulses, no edema, no cyanosis, no clubbing Skin:  No rashes no nodules Neuro:  CN II through XII intact, motor grossly intact  DEVICE  Normal device function.  See PaceArt for details.   Assess/Plan:

## 2012-06-04 ENCOUNTER — Ambulatory Visit (INDEPENDENT_AMBULATORY_CARE_PROVIDER_SITE_OTHER): Payer: Medicare Other

## 2012-06-04 DIAGNOSIS — I4891 Unspecified atrial fibrillation: Secondary | ICD-10-CM | POA: Diagnosis not present

## 2012-06-04 DIAGNOSIS — Z7901 Long term (current) use of anticoagulants: Secondary | ICD-10-CM

## 2012-06-04 LAB — POCT INR: INR: 3.1

## 2012-07-02 ENCOUNTER — Ambulatory Visit (INDEPENDENT_AMBULATORY_CARE_PROVIDER_SITE_OTHER): Payer: Medicare Other

## 2012-07-02 DIAGNOSIS — Z7901 Long term (current) use of anticoagulants: Secondary | ICD-10-CM | POA: Diagnosis not present

## 2012-07-02 DIAGNOSIS — I4891 Unspecified atrial fibrillation: Secondary | ICD-10-CM

## 2012-07-02 LAB — POCT INR: INR: 3

## 2012-07-18 DIAGNOSIS — S065X9A Traumatic subdural hemorrhage with loss of consciousness of unspecified duration, initial encounter: Secondary | ICD-10-CM

## 2012-07-18 DIAGNOSIS — R51 Headache: Secondary | ICD-10-CM | POA: Diagnosis not present

## 2012-07-18 DIAGNOSIS — S065XAA Traumatic subdural hemorrhage with loss of consciousness status unknown, initial encounter: Secondary | ICD-10-CM

## 2012-07-18 HISTORY — DX: Traumatic subdural hemorrhage with loss of consciousness status unknown, initial encounter: S06.5XAA

## 2012-07-18 HISTORY — DX: Traumatic subdural hemorrhage with loss of consciousness of unspecified duration, initial encounter: S06.5X9A

## 2012-07-29 ENCOUNTER — Encounter (HOSPITAL_COMMUNITY): Payer: Self-pay | Admitting: Emergency Medicine

## 2012-07-29 ENCOUNTER — Inpatient Hospital Stay (HOSPITAL_COMMUNITY)
Admission: EM | Admit: 2012-07-29 | Discharge: 2012-08-02 | DRG: 026 | Disposition: A | Discharge status: Home / Self Care | Payer: Medicare Other | Attending: Pulmonary Disease | Admitting: Pulmonary Disease

## 2012-07-29 DIAGNOSIS — Z8547 Personal history of malignant neoplasm of testis: Secondary | ICD-10-CM

## 2012-07-29 DIAGNOSIS — R0989 Other specified symptoms and signs involving the circulatory and respiratory systems: Secondary | ICD-10-CM

## 2012-07-29 DIAGNOSIS — I4891 Unspecified atrial fibrillation: Secondary | ICD-10-CM

## 2012-07-29 DIAGNOSIS — I6200 Nontraumatic subdural hemorrhage, unspecified: Principal | ICD-10-CM | POA: Diagnosis present

## 2012-07-29 DIAGNOSIS — Z950 Presence of cardiac pacemaker: Secondary | ICD-10-CM

## 2012-07-29 DIAGNOSIS — K5900 Constipation, unspecified: Secondary | ICD-10-CM | POA: Diagnosis not present

## 2012-07-29 DIAGNOSIS — Z87891 Personal history of nicotine dependence: Secondary | ICD-10-CM

## 2012-07-29 DIAGNOSIS — G911 Obstructive hydrocephalus: Secondary | ICD-10-CM | POA: Diagnosis present

## 2012-07-29 DIAGNOSIS — I2510 Atherosclerotic heart disease of native coronary artery without angina pectoris: Secondary | ICD-10-CM

## 2012-07-29 DIAGNOSIS — I10 Essential (primary) hypertension: Secondary | ICD-10-CM

## 2012-07-29 DIAGNOSIS — G4730 Sleep apnea, unspecified: Secondary | ICD-10-CM

## 2012-07-29 DIAGNOSIS — Z87898 Personal history of other specified conditions: Secondary | ICD-10-CM

## 2012-07-29 DIAGNOSIS — S065XAA Traumatic subdural hemorrhage with loss of consciousness status unknown, initial encounter: Secondary | ICD-10-CM

## 2012-07-29 DIAGNOSIS — S065X9A Traumatic subdural hemorrhage with loss of consciousness of unspecified duration, initial encounter: Secondary | ICD-10-CM

## 2012-07-29 DIAGNOSIS — G4733 Obstructive sleep apnea (adult) (pediatric): Secondary | ICD-10-CM

## 2012-07-29 DIAGNOSIS — E785 Hyperlipidemia, unspecified: Secondary | ICD-10-CM

## 2012-07-29 DIAGNOSIS — E119 Type 2 diabetes mellitus without complications: Secondary | ICD-10-CM

## 2012-07-29 DIAGNOSIS — E669 Obesity, unspecified: Secondary | ICD-10-CM

## 2012-07-29 DIAGNOSIS — Z7901 Long term (current) use of anticoagulants: Secondary | ICD-10-CM

## 2012-07-29 DIAGNOSIS — Z951 Presence of aortocoronary bypass graft: Secondary | ICD-10-CM | POA: Diagnosis present

## 2012-07-29 DIAGNOSIS — E7801 Familial hypercholesterolemia: Secondary | ICD-10-CM

## 2012-07-29 DIAGNOSIS — I499 Cardiac arrhythmia, unspecified: Secondary | ICD-10-CM

## 2012-07-29 DIAGNOSIS — N400 Enlarged prostate without lower urinary tract symptoms: Secondary | ICD-10-CM | POA: Diagnosis present

## 2012-07-29 DIAGNOSIS — G9389 Other specified disorders of brain: Secondary | ICD-10-CM | POA: Diagnosis not present

## 2012-07-29 DIAGNOSIS — R51 Headache: Secondary | ICD-10-CM | POA: Diagnosis not present

## 2012-07-29 DIAGNOSIS — T45515A Adverse effect of anticoagulants, initial encounter: Secondary | ICD-10-CM | POA: Diagnosis present

## 2012-07-29 LAB — CBC
HCT: 35.4 % — ABNORMAL LOW (ref 39.0–52.0)
Hemoglobin: 12.1 g/dL — ABNORMAL LOW (ref 13.0–17.0)
MCH: 30.6 pg (ref 26.0–34.0)
MCHC: 34.2 g/dL (ref 30.0–36.0)
MCV: 89.4 fL (ref 78.0–100.0)
Platelets: 126 10*3/uL — ABNORMAL LOW (ref 150–400)
RBC: 3.96 MIL/uL — ABNORMAL LOW (ref 4.22–5.81)
RDW: 14.7 % (ref 11.5–15.5)
WBC: 7.9 10*3/uL (ref 4.0–10.5)

## 2012-07-29 LAB — PROTIME-INR
INR: 2.81 — ABNORMAL HIGH (ref 0.00–1.49)
Prothrombin Time: 30 seconds — ABNORMAL HIGH (ref 11.6–15.2)

## 2012-07-29 MED ORDER — ONDANSETRON HCL 4 MG/2ML IJ SOLN
4.0000 mg | Freq: Once | INTRAMUSCULAR | Status: AC
  Administered 2012-07-29: 4 mg via INTRAVENOUS
  Filled 2012-07-29: qty 2

## 2012-07-29 MED ORDER — METOCLOPRAMIDE HCL 5 MG/ML IJ SOLN
10.0000 mg | Freq: Once | INTRAMUSCULAR | Status: AC
  Administered 2012-07-30: 10 mg via INTRAVENOUS
  Filled 2012-07-29: qty 2

## 2012-07-29 MED ORDER — MORPHINE SULFATE 4 MG/ML IJ SOLN
4.0000 mg | Freq: Once | INTRAMUSCULAR | Status: AC
  Administered 2012-07-29: 4 mg via INTRAVENOUS
  Filled 2012-07-29: qty 1

## 2012-07-29 NOTE — ED Notes (Signed)
The pt has had a headache for 6 weeks with  No nv or diarrhea.  He does not have headaches normally.  Alert no distress

## 2012-07-29 NOTE — ED Notes (Addendum)
Pt reports headache for about 5 weeks, has seen PCP and has appt with neurologist on Thursday, but pain too severe tonight; reports pain starts between eyebrows and radiates to the back; does report some dizziness when stands up quickly;  pt denies n/v/vision changes; last took tylenol for pain about 1:30-2pm; pt denies hx of migraines; pt a&ox4; grips equal, face symmetrical, no drift; speech clear

## 2012-07-29 NOTE — ED Provider Notes (Addendum)
History     CSN: [id number]  Arrival date & time 07/29/12  2106   First MD Initiated Contact with Patient 07/29/12 2300      Chief Complaint  Patient presents with  . Headache    (Consider location/radiation/quality/duration/timing/severity/associated sxs/prior treatment) Patient is a 71 y.o. male presenting with headaches. The history is provided by the patient.  Headache  This is a chronic problem. The current episode started more than 1 week ago. The problem occurs constantly. The problem has been gradually worsening. The headache is associated with nothing. The pain is located in the bilateral region. The quality of the pain is described as throbbing. The pain is at a severity of 7/10. The pain does not radiate. Pertinent negatives include no anorexia, no fever, no malaise/fatigue, no chest pressure, no near-syncope, no orthopnea, no palpitations, no syncope, no shortness of breath, no nausea and no vomiting. He has tried acetaminophen for the symptoms. The treatment provided no relief.    Past Medical History  Diagnosis Date  . Pacemaker     PPM - St. Jude  . Atrial fibrillation   . CAD (coronary artery disease), native coronary artery   . DM (diabetes mellitus)   . Hyperlipidemia type II   . OSA (obstructive sleep apnea)   . BPH (benign prostatic hyperplasia)     Past Surgical History  Procedure Date  . Foot surgery   . Cholecystectomy   . Appendectomy   . Orchiectomy     Left  /  testicular cancer  . Hernia repair   . Coronary artery bypass graft     x3 (left internal mammary artery to distal left anterior descending coronary artery, saphenous vain graft to second circumflex marginal branch, saphenous vain graft to posterior descending coronary artery, endoscopic saphenous vain harvest from right thigh) and modified Cox - Maze IV procedure.  [name]. [name],MD. Electronically signed CHO/MEDQ D: 01/09/2008/ JOB: [id number] cc:  Antionette Char MD  . Pacemaker placement       PPM - St. Jude    Family History  Problem Relation Age of Onset  . Heart disease Father   . Heart attack Father     History  Substance Use Topics  . Smoking status: Former Smoker    Quit date: 02/21/1991  . Smokeless tobacco: Not on file  . Alcohol Use: No      Review of Systems  Constitutional: Negative for fever and malaise/fatigue.  Respiratory: Negative for shortness of breath.   Cardiovascular: Negative for palpitations, orthopnea, syncope and near-syncope.  Gastrointestinal: Negative for nausea, vomiting and anorexia.  Neurological: Positive for headaches.  All other systems reviewed and are negative.    Allergies  Review of patient's allergies indicates no known allergies.  Home Medications   Current Outpatient Rx  Name Route Sig Dispense Refill  . DRONEDARONE HCL 400 MG PO TABS Oral Take 1 tablet (400 mg total) by mouth 2 (two) times daily with a meal. 60 tablet 12  . FUROSEMIDE 40 MG PO TABS Oral Take 40 mg by mouth. Take 3-4 times per week, as needed. For fluid    . METOPROLOL TARTRATE 50 MG PO TABS Oral Take 1 tablet (50 mg total) by mouth 2 (two) times daily. 180 tablet 2  . OMEGA-3-ACID ETHYL ESTERS 1 G PO CAPS Oral Take 2 g by mouth 3 (three) times daily.    [name] POTASSIUM CHLORIDE CRYS ER 20 MEQ PO TBCR Oral Take 20 mEq by mouth daily.      [name]  ROSUVASTATIN CALCIUM 10 MG PO TABS Oral Take 10 mg by mouth daily.      . SERTRALINE HCL 100 MG PO TABS Oral Take 100 mg by mouth daily.    [name] TAMSULOSIN HCL 0.4 MG PO CAPS Oral Take by mouth at bedtime.     . WARFARIN SODIUM 5 MG PO TABS Oral Take 1 tablet (5 mg total) by mouth as directed. Take as directed by coumadin clinic, this is a 90 supply 135 tablet 1    BP 128/78  Pulse 74  Temp 97 F (36.1 C) (Oral)  Resp 21  SpO2 97%  Physical Exam  Constitutional: He is oriented to person, place, and time. He appears well-developed and well-nourished.  HENT:  Head: Normocephalic and atraumatic.       No  temporal tenderness  Eyes: Conjunctivae are normal. Pupils are equal, round, and reactive to light.  Neck: Normal range of motion. Neck supple.       No meningismus  Cardiovascular: Normal rate, regular rhythm, normal heart sounds and intact distal pulses.   Pulmonary/Chest: Effort normal and breath sounds normal.  Abdominal: Soft. Bowel sounds are normal.  Neurological: He is alert and oriented to person, place, and time.  Skin: Skin is warm and dry.  Psychiatric: He has a normal mood and affect. His behavior is normal. Judgment and thought content normal.    ED Course  Procedures (including critical care time)   Labs Reviewed  CBC  COMPREHENSIVE METABOLIC PANEL  SEDIMENTATION RATE   No results found.   No diagnosis found.   Date: 07/30/2012  Rate: 70  Rhythm: paced  QRS Axis: left  Intervals: paced  ST/T Wave abnormalities: nonspecific ST changes  Conduction Disutrbances:left bundle branch block  Narrative Interpretation:   Old EKG Reviewed: changes noted   MDM  Pt with ha x 5 weeks,  Seen by pmd who had referred to neurologist on thurs.  Pt states that family became concerned and decided to have him seen tonight.  Alerted by radiologist of subdural with large amount of shift.  Pt gcs 15. Neurosurgery called.  Wants oral anticoagulation reversal protocol and hospitalist intervention.    Critical care 30 min        Athina Fahey Lytle Michaels, MD 07/30/12 1610  Rosanne Ashing, MD 07/30/12 9604

## 2012-07-29 NOTE — ED Notes (Signed)
Iv and pain med given.  To c-t

## 2012-07-30 ENCOUNTER — Encounter (HOSPITAL_COMMUNITY)
Admission: EM | Disposition: A | Discharge status: Home / Self Care | Payer: Self-pay | Source: Home / Self Care | Attending: Pulmonary Disease

## 2012-07-30 ENCOUNTER — Encounter (HOSPITAL_COMMUNITY): Payer: Self-pay | Admitting: Internal Medicine

## 2012-07-30 ENCOUNTER — Emergency Department (HOSPITAL_COMMUNITY): Payer: Medicare Other

## 2012-07-30 ENCOUNTER — Inpatient Hospital Stay (HOSPITAL_COMMUNITY): Payer: Medicare Other | Admitting: Certified Registered Nurse Anesthetist

## 2012-07-30 ENCOUNTER — Encounter (HOSPITAL_COMMUNITY): Payer: Self-pay | Admitting: Certified Registered Nurse Anesthetist

## 2012-07-30 DIAGNOSIS — G9389 Other specified disorders of brain: Secondary | ICD-10-CM | POA: Diagnosis not present

## 2012-07-30 DIAGNOSIS — I6200 Nontraumatic subdural hemorrhage, unspecified: Principal | ICD-10-CM

## 2012-07-30 DIAGNOSIS — G911 Obstructive hydrocephalus: Secondary | ICD-10-CM | POA: Diagnosis present

## 2012-07-30 DIAGNOSIS — E119 Type 2 diabetes mellitus without complications: Secondary | ICD-10-CM

## 2012-07-30 DIAGNOSIS — I4891 Unspecified atrial fibrillation: Secondary | ICD-10-CM | POA: Diagnosis not present

## 2012-07-30 DIAGNOSIS — S065XAA Traumatic subdural hemorrhage with loss of consciousness status unknown, initial encounter: Secondary | ICD-10-CM | POA: Diagnosis present

## 2012-07-30 DIAGNOSIS — I10 Essential (primary) hypertension: Secondary | ICD-10-CM | POA: Diagnosis not present

## 2012-07-30 DIAGNOSIS — S065X9A Traumatic subdural hemorrhage with loss of consciousness of unspecified duration, initial encounter: Secondary | ICD-10-CM | POA: Diagnosis present

## 2012-07-30 DIAGNOSIS — Z8547 Personal history of malignant neoplasm of testis: Secondary | ICD-10-CM | POA: Diagnosis not present

## 2012-07-30 DIAGNOSIS — E785 Hyperlipidemia, unspecified: Secondary | ICD-10-CM | POA: Diagnosis present

## 2012-07-30 DIAGNOSIS — I2510 Atherosclerotic heart disease of native coronary artery without angina pectoris: Secondary | ICD-10-CM

## 2012-07-30 DIAGNOSIS — Z7901 Long term (current) use of anticoagulants: Secondary | ICD-10-CM

## 2012-07-30 DIAGNOSIS — Z87891 Personal history of nicotine dependence: Secondary | ICD-10-CM | POA: Diagnosis not present

## 2012-07-30 DIAGNOSIS — G4733 Obstructive sleep apnea (adult) (pediatric): Secondary | ICD-10-CM | POA: Diagnosis present

## 2012-07-30 DIAGNOSIS — I609 Nontraumatic subarachnoid hemorrhage, unspecified: Secondary | ICD-10-CM | POA: Diagnosis not present

## 2012-07-30 DIAGNOSIS — N400 Enlarged prostate without lower urinary tract symptoms: Secondary | ICD-10-CM | POA: Diagnosis present

## 2012-07-30 DIAGNOSIS — R51 Headache: Secondary | ICD-10-CM | POA: Diagnosis not present

## 2012-07-30 DIAGNOSIS — Z950 Presence of cardiac pacemaker: Secondary | ICD-10-CM | POA: Diagnosis not present

## 2012-07-30 DIAGNOSIS — K5900 Constipation, unspecified: Secondary | ICD-10-CM | POA: Diagnosis not present

## 2012-07-30 DIAGNOSIS — Z951 Presence of aortocoronary bypass graft: Secondary | ICD-10-CM | POA: Diagnosis not present

## 2012-07-30 HISTORY — PX: CRANIOTOMY: SHX93

## 2012-07-30 LAB — BASIC METABOLIC PANEL
BUN: 11 mg/dL (ref 6–23)
CO2: 27 mEq/L (ref 19–32)
Calcium: 8.2 mg/dL — ABNORMAL LOW (ref 8.4–10.5)
Chloride: 109 mEq/L (ref 96–112)
Creatinine, Ser: 0.97 mg/dL (ref 0.50–1.35)
GFR calc Af Amer: >90 mL/min (ref >90)
GFR calc non Af Amer: 82 mL/min — ABNORMAL LOW (ref >90)
Glucose, Bld: 106 mg/dL — ABNORMAL HIGH (ref 70–99)
Potassium: 3.8 mEq/L (ref 3.5–5.1)
Sodium: 143 mEq/L (ref 135–145)

## 2012-07-30 LAB — COMPREHENSIVE METABOLIC PANEL
ALT: 18 U/L (ref 0–53)
ALT: 17 U/L (ref 0–53)
AST: 26 U/L (ref 0–37)
AST: 25 U/L (ref 0–37)
Albumin: 4 g/dL (ref 3.5–5.2)
Albumin: 3.7 g/dL (ref 3.5–5.2)
Alkaline Phosphatase: 70 U/L (ref 39–117)
Alkaline Phosphatase: 67 U/L (ref 39–117)
BUN: 15 mg/dL (ref 6–23)
BUN: 14 mg/dL (ref 6–23)
CO2: 28 mEq/L (ref 19–32)
CO2: 27 mEq/L (ref 19–32)
Calcium: 9.4 mg/dL (ref 8.4–10.5)
Calcium: 8.8 mg/dL (ref 8.4–10.5)
Chloride: 107 mEq/L (ref 96–112)
Chloride: 108 mEq/L (ref 96–112)
Creatinine, Ser: 1.15 mg/dL (ref 0.50–1.35)
Creatinine, Ser: 1.06 mg/dL (ref 0.50–1.35)
GFR calc Af Amer: 73 mL/min — ABNORMAL LOW (ref >90)
GFR calc Af Amer: 81 mL/min — ABNORMAL LOW (ref >90)
GFR calc non Af Amer: 63 mL/min — ABNORMAL LOW (ref >90)
GFR calc non Af Amer: 70 mL/min — ABNORMAL LOW (ref >90)
Glucose, Bld: 116 mg/dL — ABNORMAL HIGH (ref 70–99)
Glucose, Bld: 112 mg/dL — ABNORMAL HIGH (ref 70–99)
Potassium: 4.3 mEq/L (ref 3.5–5.1)
Potassium: 4.2 mEq/L (ref 3.5–5.1)
Sodium: 143 mEq/L (ref 135–145)
Sodium: 142 mEq/L (ref 135–145)
Total Bilirubin: 0.6 mg/dL (ref 0.3–1.2)
Total Bilirubin: 0.6 mg/dL (ref 0.3–1.2)
Total Protein: 7.7 g/dL (ref 6.0–8.3)
Total Protein: 7.1 g/dL (ref 6.0–8.3)

## 2012-07-30 LAB — CBC
HCT: 34.5 % — ABNORMAL LOW (ref 39.0–52.0)
HCT: 31.4 % — ABNORMAL LOW (ref 39.0–52.0)
Hemoglobin: 11.8 g/dL — ABNORMAL LOW (ref 13.0–17.0)
Hemoglobin: 10.8 g/dL — ABNORMAL LOW (ref 13.0–17.0)
MCH: 30.6 pg (ref 26.0–34.0)
MCH: 30.6 pg (ref 26.0–34.0)
MCHC: 34.2 g/dL (ref 30.0–36.0)
MCHC: 34.4 g/dL (ref 30.0–36.0)
MCV: 89.4 fL (ref 78.0–100.0)
MCV: 89 fL (ref 78.0–100.0)
Platelets: 122 10*3/uL — ABNORMAL LOW (ref 150–400)
Platelets: 143 10*3/uL — ABNORMAL LOW (ref 150–400)
RBC: 3.86 MIL/uL — ABNORMAL LOW (ref 4.22–5.81)
RBC: 3.53 MIL/uL — ABNORMAL LOW (ref 4.22–5.81)
RDW: 14.9 % (ref 11.5–15.5)
RDW: 14.9 % (ref 11.5–15.5)
WBC: 7.2 10*3/uL (ref 4.0–10.5)
WBC: 6.7 10*3/uL (ref 4.0–10.5)

## 2012-07-30 LAB — MRSA PCR SCREENING: MRSA by PCR: NEGATIVE

## 2012-07-30 LAB — GLUCOSE, CAPILLARY
Glucose-Capillary: 108 mg/dL — ABNORMAL HIGH (ref 70–99)
Glucose-Capillary: 109 mg/dL — ABNORMAL HIGH (ref 70–99)
Glucose-Capillary: 101 mg/dL — ABNORMAL HIGH (ref 70–99)
Glucose-Capillary: 91 mg/dL (ref 70–99)
Glucose-Capillary: 102 mg/dL — ABNORMAL HIGH (ref 70–99)

## 2012-07-30 LAB — PROTIME-INR
INR: 1.8 — ABNORMAL HIGH (ref 0.00–1.49)
INR: 1.42 (ref 0.00–1.49)
INR: 1.32 (ref 0.00–1.49)
INR: 1.42 (ref 0.00–1.49)
INR: 1.35 (ref 0.00–1.49)
Prothrombin Time: 21.2 seconds — ABNORMAL HIGH (ref 11.6–15.2)
Prothrombin Time: 17.6 seconds — ABNORMAL HIGH (ref 11.6–15.2)
Prothrombin Time: 16.6 seconds — ABNORMAL HIGH (ref 11.6–15.2)
Prothrombin Time: 17.6 seconds — ABNORMAL HIGH (ref 11.6–15.2)
Prothrombin Time: 16.9 seconds — ABNORMAL HIGH (ref 11.6–15.2)

## 2012-07-30 LAB — HEMOGLOBIN A1C
Hgb A1c MFr Bld: 5.7 % — ABNORMAL HIGH (ref <5.7)
Mean Plasma Glucose: 117 mg/dL — ABNORMAL HIGH (ref <117)

## 2012-07-30 LAB — SEDIMENTATION RATE: Sed Rate: 20 mm/hr — ABNORMAL HIGH (ref 0–16)

## 2012-07-30 LAB — TYPE AND SCREEN
ABO/RH(D): A POS
Antibody Screen: NEGATIVE

## 2012-07-30 LAB — APTT: aPTT: 32 seconds (ref 24–37)

## 2012-07-30 SURGERY — CRANIOTOMY HEMATOMA EVACUATION SUBDURAL
Anesthesia: General | Site: Head | Laterality: Right | Wound class: Clean

## 2012-07-30 MED ORDER — SERTRALINE HCL 100 MG PO TABS
100.0000 mg | ORAL_TABLET | Freq: Every day | ORAL | Status: DC
  Administered 2012-07-31 – 2012-08-02 (×3): 100 mg via ORAL
  Filled 2012-07-30 (×4): qty 1

## 2012-07-30 MED ORDER — HYDROMORPHONE HCL PF 1 MG/ML IJ SOLN
INTRAMUSCULAR | Status: AC
  Filled 2012-07-30: qty 1

## 2012-07-30 MED ORDER — ONDANSETRON HCL 4 MG/2ML IJ SOLN
4.0000 mg | INTRAMUSCULAR | Status: DC | PRN
  Administered 2012-07-31: 4 mg via INTRAVENOUS

## 2012-07-30 MED ORDER — SODIUM CHLORIDE 0.9 % IJ SOLN
3.0000 mL | Freq: Two times a day (BID) | INTRAMUSCULAR | Status: DC
  Administered 2012-07-30 (×2): 3 mL via INTRAVENOUS

## 2012-07-30 MED ORDER — INSULIN ASPART 100 UNIT/ML ~~LOC~~ SOLN: 0.0000 [IU] | Freq: Three times a day (TID) | SUBCUTANEOUS | Status: DC

## 2012-07-30 MED ORDER — ONDANSETRON HCL 4 MG PO TABS: 4.0000 mg | ORAL_TABLET | Freq: Four times a day (QID) | ORAL | Status: DC | PRN

## 2012-07-30 MED ORDER — ANTIINHIBITOR COAGULANT CMPLX IV SOLR: 500.0000 [IU] | Freq: Once | INTRAVENOUS | Status: DC | PRN

## 2012-07-30 MED ORDER — ACETAMINOPHEN 650 MG RE SUPP: 650.0000 mg | Freq: Four times a day (QID) | RECTAL | Status: DC | PRN

## 2012-07-30 MED ORDER — DOCUSATE SODIUM 100 MG PO CAPS
100.0000 mg | ORAL_CAPSULE | Freq: Two times a day (BID) | ORAL | Status: DC
  Administered 2012-07-30 – 2012-08-02 (×5): 100 mg via ORAL
  Filled 2012-07-30 (×6): qty 1

## 2012-07-30 MED ORDER — LABETALOL HCL 5 MG/ML IV SOLN
INTRAVENOUS | Status: AC
  Administered 2012-07-30: 10 mg
  Filled 2012-07-30: qty 4

## 2012-07-30 MED ORDER — SODIUM CHLORIDE 0.9 % IV SOLN: 250.0000 mL | INTRAVENOUS | Status: DC | PRN

## 2012-07-30 MED ORDER — HYDROCODONE-ACETAMINOPHEN 5-325 MG PO TABS
1.0000 | ORAL_TABLET | ORAL | Status: DC | PRN
  Administered 2012-07-30 – 2012-08-02 (×12): 1 via ORAL
  Filled 2012-07-30 (×12): qty 1

## 2012-07-30 MED ORDER — ANTIINHIBITOR COAGULANT CMPLX IV SOLR
539.0000 [IU] | Freq: Once | INTRAVENOUS | Status: AC
  Administered 2012-07-30: 539 [IU] via INTRAVENOUS
  Filled 2012-07-30: qty 539

## 2012-07-30 MED ORDER — HYDROMORPHONE HCL PF 1 MG/ML IJ SOLN
0.5000 mg | INTRAMUSCULAR | Status: DC | PRN
  Administered 2012-07-30 (×2): 0.5 mg via INTRAVENOUS
  Administered 2012-07-31 – 2012-08-02 (×8): 1 mg via INTRAVENOUS
  Filled 2012-07-30 (×11): qty 1

## 2012-07-30 MED ORDER — DEXTROSE 5 % IV SOLN
10.0000 mg | INTRAVENOUS | Status: AC
  Administered 2012-07-30: 10 mg via INTRAVENOUS
  Filled 2012-07-30: qty 1

## 2012-07-30 MED ORDER — LIDOCAINE HCL 4 % MT SOLN
OROMUCOSAL | Status: DC | PRN
  Administered 2012-07-30: 4 mL via TOPICAL

## 2012-07-30 MED ORDER — HYDROMORPHONE HCL PF 1 MG/ML IJ SOLN
0.2500 mg | INTRAMUSCULAR | Status: DC | PRN
  Administered 2012-07-30 (×2): 0.25 mg via INTRAVENOUS
  Administered 2012-07-30: 0.5 mg via INTRAVENOUS

## 2012-07-30 MED ORDER — CEFAZOLIN SODIUM 1-5 GM-% IV SOLN
INTRAVENOUS | Status: AC
  Administered 2012-07-30 (×2): 1 g via INTRAVENOUS
  Filled 2012-07-30: qty 50

## 2012-07-30 MED ORDER — ACETAMINOPHEN 325 MG PO TABS: 650.0000 mg | ORAL_TABLET | ORAL | Status: DC | PRN

## 2012-07-30 MED ORDER — HEMOSTATIC AGENTS (NO CHARGE) OPTIME
TOPICAL | Status: DC | PRN
  Administered 2012-07-30: 1 via TOPICAL

## 2012-07-30 MED ORDER — POTASSIUM CHLORIDE CRYS ER 20 MEQ PO TBCR
20.0000 meq | EXTENDED_RELEASE_TABLET | Freq: Every day | ORAL | Status: DC
  Administered 2012-07-31 – 2012-08-02 (×3): 20 meq via ORAL
  Filled 2012-07-30 (×4): qty 1

## 2012-07-30 MED ORDER — FUROSEMIDE 40 MG PO TABS
40.0000 mg | ORAL_TABLET | Freq: Every day | ORAL | Status: DC
  Administered 2012-07-31 – 2012-08-02 (×3): 40 mg via ORAL
  Filled 2012-07-30 (×4): qty 1

## 2012-07-30 MED ORDER — BACITRACIN ZINC 500 UNIT/GM EX OINT
TOPICAL_OINTMENT | CUTANEOUS | Status: DC | PRN
  Administered 2012-07-30: 1 via TOPICAL

## 2012-07-30 MED ORDER — INSULIN ASPART 100 UNIT/ML ~~LOC~~ SOLN: 0.0000 [IU] | SUBCUTANEOUS | Status: DC

## 2012-07-30 MED ORDER — ONDANSETRON HCL 4 MG/2ML IJ SOLN: 4.0000 mg | Freq: Once | INTRAMUSCULAR | Status: DC | PRN

## 2012-07-30 MED ORDER — ALBUTEROL SULFATE HFA 108 (90 BASE) MCG/ACT IN AERS
INHALATION_SPRAY | RESPIRATORY_TRACT | Status: DC | PRN
  Administered 2012-07-30: 2 via RESPIRATORY_TRACT

## 2012-07-30 MED ORDER — 0.9 % SODIUM CHLORIDE (POUR BTL) OPTIME
TOPICAL | Status: DC | PRN
  Administered 2012-07-30 (×3): 1000 mL

## 2012-07-30 MED ORDER — SODIUM CHLORIDE 0.9 % IR SOLN
Status: DC | PRN
  Administered 2012-07-30: 11:00:00

## 2012-07-30 MED ORDER — ROCURONIUM BROMIDE 100 MG/10ML IV SOLN
INTRAVENOUS | Status: DC | PRN
  Administered 2012-07-30: 50 mg via INTRAVENOUS

## 2012-07-30 MED ORDER — SODIUM CHLORIDE 0.9 % IV SOLN
500.0000 mg | Freq: Two times a day (BID) | INTRAVENOUS | Status: DC
  Administered 2012-07-30 – 2012-08-02 (×6): 500 mg via INTRAVENOUS
  Filled 2012-07-30 (×11): qty 5

## 2012-07-30 MED ORDER — VITAMIN K1 10 MG/ML IJ SOLN: 5.0000 mg | Freq: Once | INTRAMUSCULAR | Status: DC

## 2012-07-30 MED ORDER — MICROFIBRILLAR COLL HEMOSTAT EX PADS
MEDICATED_PAD | CUTANEOUS | Status: DC | PRN
  Administered 2012-07-30: 1 via TOPICAL

## 2012-07-30 MED ORDER — PROMETHAZINE HCL 12.5 MG PO TABS
12.5000 mg | ORAL_TABLET | ORAL | Status: DC | PRN
  Filled 2012-07-30: qty 2

## 2012-07-30 MED ORDER — SODIUM CHLORIDE 0.9 % IV SOLN: INTRAVENOUS | Status: DC

## 2012-07-30 MED ORDER — METOPROLOL TARTRATE 50 MG PO TABS
50.0000 mg | ORAL_TABLET | Freq: Two times a day (BID) | ORAL | Status: DC
  Administered 2012-07-30 – 2012-08-02 (×7): 50 mg via ORAL
  Filled 2012-07-30 (×9): qty 1

## 2012-07-30 MED ORDER — ONDANSETRON HCL 4 MG/2ML IJ SOLN: 4.0000 mg | Freq: Four times a day (QID) | INTRAMUSCULAR | Status: DC | PRN

## 2012-07-30 MED ORDER — CEFAZOLIN SODIUM 1-5 GM-% IV SOLN
INTRAVENOUS | Status: AC
  Filled 2012-07-30: qty 50

## 2012-07-30 MED ORDER — MORPHINE SULFATE 2 MG/ML IJ SOLN
1.0000 mg | INTRAMUSCULAR | Status: DC | PRN
  Administered 2012-07-30 – 2012-08-01 (×3): 1 mg via INTRAVENOUS
  Filled 2012-07-30 (×4): qty 1

## 2012-07-30 MED ORDER — POTASSIUM CHLORIDE IN NACL 20-0.9 MEQ/L-% IV SOLN
INTRAVENOUS | Status: DC
  Administered 2012-07-30 – 2012-07-31 (×2): via INTRAVENOUS
  Filled 2012-07-30 (×3): qty 1000

## 2012-07-30 MED ORDER — GLYCOPYRROLATE 0.2 MG/ML IJ SOLN
INTRAMUSCULAR | Status: DC | PRN
Start: 2012-07-30 — End: 2012-07-30
  Administered 2012-07-30: .8 mg via INTRAVENOUS

## 2012-07-30 MED ORDER — ANTIINHIBITOR COAGULANT CMPLX IV SOLR
539.0000 [IU] | INTRAVENOUS | Status: AC
  Administered 2012-07-30: 539 [IU] via INTRAVENOUS
  Filled 2012-07-30: qty 539

## 2012-07-30 MED ORDER — CEFAZOLIN SODIUM 1-5 GM-% IV SOLN
1.0000 g | Freq: Three times a day (TID) | INTRAVENOUS | Status: AC
  Administered 2012-07-30 – 2012-07-31 (×2): 1 g via INTRAVENOUS
  Filled 2012-07-30 (×2): qty 50

## 2012-07-30 MED ORDER — SODIUM CHLORIDE 0.9 % IJ SOLN: 3.0000 mL | INTRAMUSCULAR | Status: DC | PRN

## 2012-07-30 MED ORDER — TAMSULOSIN HCL 0.4 MG PO CAPS
0.4000 mg | ORAL_CAPSULE | Freq: Every day | ORAL | Status: DC
  Administered 2012-07-30 – 2012-08-01 (×3): 0.4 mg via ORAL
  Filled 2012-07-30 (×4): qty 1

## 2012-07-30 MED ORDER — LIDOCAINE HCL (CARDIAC) 20 MG/ML IV SOLN
INTRAVENOUS | Status: DC | PRN
  Administered 2012-07-30: 100 mg via INTRAVENOUS

## 2012-07-30 MED ORDER — FENTANYL CITRATE 0.05 MG/ML IJ SOLN
INTRAMUSCULAR | Status: DC | PRN
  Administered 2012-07-30: 150 ug via INTRAVENOUS

## 2012-07-30 MED ORDER — DRONEDARONE HCL 400 MG PO TABS
400.0000 mg | ORAL_TABLET | Freq: Two times a day (BID) | ORAL | Status: DC
  Administered 2012-07-30 – 2012-08-02 (×6): 400 mg via ORAL
  Filled 2012-07-30 (×10): qty 1

## 2012-07-30 MED ORDER — LABETALOL HCL 5 MG/ML IV SOLN
10.0000 mg | INTRAVENOUS | Status: DC | PRN
  Administered 2012-07-30: 20 mg via INTRAVENOUS
  Filled 2012-07-30: qty 4

## 2012-07-30 MED ORDER — LIDOCAINE-EPINEPHRINE 1 %-1:100000 IJ SOLN
INTRAMUSCULAR | Status: DC | PRN
  Administered 2012-07-30: 10 mL

## 2012-07-30 MED ORDER — PANTOPRAZOLE SODIUM 40 MG IV SOLR
40.0000 mg | Freq: Every day | INTRAVENOUS | Status: DC
  Administered 2012-07-30: 40 mg via INTRAVENOUS
  Filled 2012-07-30 (×2): qty 40

## 2012-07-30 MED ORDER — ANTIINHIBITOR COAGULANT CMPLX IV SOLR
500.0000 [IU] | INTRAVENOUS | Status: DC
  Filled 2012-07-30: qty 500

## 2012-07-30 MED ORDER — NEOSTIGMINE METHYLSULFATE 1 MG/ML IJ SOLN
INTRAMUSCULAR | Status: DC | PRN
  Administered 2012-07-30: 5 mg via INTRAVENOUS

## 2012-07-30 MED ORDER — MANNITOL 25 % IV SOLN
INTRAVENOUS | Status: DC | PRN
  Administered 2012-07-30: 25 g via INTRAVENOUS

## 2012-07-30 MED ORDER — ATORVASTATIN CALCIUM 10 MG PO TABS
10.0000 mg | ORAL_TABLET | Freq: Every day | ORAL | Status: DC
  Administered 2012-07-30 – 2012-08-01 (×3): 10 mg via ORAL
  Filled 2012-07-30 (×4): qty 1

## 2012-07-30 MED ORDER — ACETAMINOPHEN 325 MG PO TABS
650.0000 mg | ORAL_TABLET | Freq: Four times a day (QID) | ORAL | Status: DC | PRN
  Administered 2012-08-01: 650 mg via ORAL

## 2012-07-30 MED ORDER — SODIUM CHLORIDE 0.9 % IV SOLN
INTRAVENOUS | Status: DC | PRN
  Administered 2012-07-30 (×2): via INTRAVENOUS

## 2012-07-30 MED ORDER — SODIUM CHLORIDE 0.9 % IJ SOLN
3.0000 mL | Freq: Two times a day (BID) | INTRAMUSCULAR | Status: DC
  Administered 2012-07-30 – 2012-08-02 (×3): 3 mL via INTRAVENOUS

## 2012-07-30 MED ORDER — PROPOFOL 10 MG/ML IV EMUL
INTRAVENOUS | Status: DC | PRN
  Administered 2012-07-30: 30 mg via INTRAVENOUS
  Administered 2012-07-30: 200 mg via INTRAVENOUS

## 2012-07-30 MED ORDER — SODIUM CHLORIDE 0.9 % IV SOLN
250.0000 mL | INTRAVENOUS | Status: DC | PRN
Start: 2012-07-30 — End: 2012-08-02

## 2012-07-30 MED ORDER — BACITRACIN 50000 UNITS IM SOLR
INTRAMUSCULAR | Status: AC
  Filled 2012-07-30: qty 1

## 2012-07-30 MED ORDER — SODIUM CHLORIDE 0.9 % IV SOLN
INTRAVENOUS | Status: AC
  Filled 2012-07-30: qty 500

## 2012-07-30 MED ORDER — ONDANSETRON HCL 4 MG PO TABS: 4.0000 mg | ORAL_TABLET | ORAL | Status: DC | PRN

## 2012-07-30 MED ORDER — ONDANSETRON HCL 4 MG/2ML IJ SOLN
INTRAMUSCULAR | Status: DC | PRN
  Administered 2012-07-30: 4 mg via INTRAVENOUS

## 2012-07-30 MED ORDER — THROMBIN 20000 UNITS EX KIT
PACK | CUTANEOUS | Status: DC | PRN
  Administered 2012-07-30: 11:00:00 via TOPICAL

## 2012-07-30 SURGICAL SUPPLY — 79 items
BAG DECANTER FOR FLEXI CONT (MISCELLANEOUS) ×2 IMPLANT
BANDAGE GAUZE 4  KLING STR (GAUZE/BANDAGES/DRESSINGS) ×2 IMPLANT
BANDAGE GAUZE ELAST BULKY 4 IN (GAUZE/BANDAGES/DRESSINGS) ×2 IMPLANT
BIT DRILL WIRE PASS 1.3MM (BIT) ×1 IMPLANT
BLADE SURG 11 STRL SS (BLADE) ×2 IMPLANT
BLADE SURG ROTATE 9660 (MISCELLANEOUS) ×2 IMPLANT
BNDG COHESIVE 4X5 TAN NS LF (GAUZE/BANDAGES/DRESSINGS) IMPLANT
BRUSH SCRUB EZ PLAIN DRY (MISCELLANEOUS) ×2 IMPLANT
BUR ACORN 9.0 PRECISION (BURR) ×2 IMPLANT
BUR ROUTER D-58 CRANI (BURR) ×2 IMPLANT
CANISTER SUCTION 2500CC (MISCELLANEOUS) ×2 IMPLANT
CLIP TI MEDIUM 6 (CLIP) IMPLANT
CLOTH BEACON ORANGE TIMEOUT ST (SAFETY) ×2 IMPLANT
CONT SPEC 4OZ CLIKSEAL STRL BL (MISCELLANEOUS) ×2 IMPLANT
CORDS BIPOLAR (ELECTRODE) ×2 IMPLANT
COVER TABLE BACK 60X90 (DRAPES) ×4 IMPLANT
DECANTER SPIKE VIAL GLASS SM (MISCELLANEOUS) ×2 IMPLANT
DERMABOND ADVANCED (GAUZE/BANDAGES/DRESSINGS) ×1
DERMABOND ADVANCED .7 DNX12 (GAUZE/BANDAGES/DRESSINGS) ×1 IMPLANT
DRAIN SNY WOU 7FLT (WOUND CARE) ×2 IMPLANT
DRAPE NEUROLOGICAL W/INCISE (DRAPES) ×2 IMPLANT
DRAPE SURG 17X23 STRL (DRAPES) IMPLANT
DRAPE WARM FLUID 44X44 (DRAPE) ×2 IMPLANT
DRESSING TELFA 8X3 (GAUZE/BANDAGES/DRESSINGS) ×2 IMPLANT
DRILL WIRE PASS 1.3MM (BIT) ×2
DRSG OPSITE 4X5.5 SM (GAUZE/BANDAGES/DRESSINGS) ×6 IMPLANT
ELECT CAUTERY BLADE 6.4 (BLADE) ×2 IMPLANT
ELECT REM PT RETURN 9FT ADLT (ELECTROSURGICAL) ×2
ELECTRODE REM PT RTRN 9FT ADLT (ELECTROSURGICAL) ×1 IMPLANT
EVACUATOR SILICONE 100CC (DRAIN) ×2 IMPLANT
GAUZE SPONGE 4X4 16PLY XRAY LF (GAUZE/BANDAGES/DRESSINGS) IMPLANT
GLOVE BIO SURGEON STRL SZ8 (GLOVE) ×2 IMPLANT
GLOVE BIOGEL PI IND STRL 7.0 (GLOVE) ×3 IMPLANT
GLOVE BIOGEL PI IND STRL 7.5 (GLOVE) ×1 IMPLANT
GLOVE BIOGEL PI INDICATOR 7.0 (GLOVE) ×3
GLOVE BIOGEL PI INDICATOR 7.5 (GLOVE) ×1
GLOVE ECLIPSE 7.5 STRL STRAW (GLOVE) ×2 IMPLANT
GLOVE EXAM NITRILE LRG STRL (GLOVE) IMPLANT
GLOVE EXAM NITRILE MD LF STRL (GLOVE) ×4 IMPLANT
GLOVE EXAM NITRILE XL STR (GLOVE) IMPLANT
GLOVE EXAM NITRILE XS STR PU (GLOVE) IMPLANT
GLOVE INDICATOR 8.5 STRL (GLOVE) ×2 IMPLANT
GLOVE SS BIOGEL STRL SZ 6.5 (GLOVE) ×2 IMPLANT
GLOVE SUPERSENSE BIOGEL SZ 6.5 (GLOVE) ×2
GOWN BRE IMP SLV AUR LG STRL (GOWN DISPOSABLE) ×4 IMPLANT
GOWN BRE IMP SLV AUR XL STRL (GOWN DISPOSABLE) ×2 IMPLANT
GOWN STRL REIN 2XL LVL4 (GOWN DISPOSABLE) ×2 IMPLANT
HEMOSTAT SURGICEL 2X14 (HEMOSTASIS) ×2 IMPLANT
HOOK DURA (MISCELLANEOUS) ×2 IMPLANT
KIT BASIN OR (CUSTOM PROCEDURE TRAY) ×2 IMPLANT
KIT ROOM TURNOVER OR (KITS) ×2 IMPLANT
NEEDLE HYPO 25X1 1.5 SAFETY (NEEDLE) ×2 IMPLANT
NS IRRIG 1000ML POUR BTL (IV SOLUTION) ×6 IMPLANT
PACK CRANIOTOMY (CUSTOM PROCEDURE TRAY) ×2 IMPLANT
PAD ARMBOARD 7.5X6 YLW CONV (MISCELLANEOUS) ×6 IMPLANT
PAD EYE OVAL STERILE LF (GAUZE/BANDAGES/DRESSINGS) IMPLANT
PATTIES SURGICAL .25X.25 (GAUZE/BANDAGES/DRESSINGS) IMPLANT
PATTIES SURGICAL .5 X.5 (GAUZE/BANDAGES/DRESSINGS) IMPLANT
PATTIES SURGICAL .5 X3 (DISPOSABLE) IMPLANT
PATTIES SURGICAL 1X1 (DISPOSABLE) IMPLANT
PIN MAYFIELD SKULL DISP (PIN) IMPLANT
PLATE 1.5 6HOLE XLONG DBL Y (Plate) ×4 IMPLANT
PLATE 1.5/0.5 22MM BURR HO (Plate) ×2 IMPLANT
SCREW SELF DRILL HT 1.5/4MM (Screw) ×26 IMPLANT
SPONGE GAUZE 4X4 12PLY (GAUZE/BANDAGES/DRESSINGS) ×2 IMPLANT
SPONGE NEURO XRAY DETECT 1X3 (DISPOSABLE) IMPLANT
SPONGE SURGIFOAM ABS GEL 100 (HEMOSTASIS) ×2 IMPLANT
STAPLER SKIN PROX WIDE 3.9 (STAPLE) ×2 IMPLANT
STAPLER VISISTAT 35W (STAPLE) IMPLANT
SUT ETHILON 3 0 FSL (SUTURE) IMPLANT
SUT NURALON 4 0 TR CR/8 (SUTURE) ×4 IMPLANT
SUT VIC AB 2-0 CT1 18 (SUTURE) ×4 IMPLANT
SYR 20ML ECCENTRIC (SYRINGE) ×2 IMPLANT
SYR CONTROL 10ML LL (SYRINGE) ×2 IMPLANT
TOWEL OR 17X24 6PK STRL BLUE (TOWEL DISPOSABLE) ×2 IMPLANT
TOWEL OR 17X26 10 PK STRL BLUE (TOWEL DISPOSABLE) ×2 IMPLANT
TRAY FOLEY CATH 14FRSI W/METER (CATHETERS) ×2 IMPLANT
UNDERPAD 30X30 INCONTINENT (UNDERPADS AND DIAPERS) ×2 IMPLANT
WATER STERILE IRR 1000ML POUR (IV SOLUTION) ×2 IMPLANT

## 2012-07-30 NOTE — Transfer of Care (Signed)
Immediate Anesthesia Transfer of Care Note  Patient: [name]  Procedure(s) Performed: Procedure(s) (LRB): CRANIOTOMY HEMATOMA EVACUATION SUBDURAL (Right)  Patient Location: PACU  Anesthesia Type: General  Level of Consciousness: awake, oriented, patient cooperative, lethargic and responds to stimulation  Airway & Oxygen Therapy: Patient Spontanous Breathing and Patient connected to face mask oxygen  Post-op Assessment: Report given to PACU RN and Post -op Vital signs reviewed and stable, moves all extremities.  Post vital signs: Reviewed and stable  Complications: No apparent anesthesia complications

## 2012-07-30 NOTE — ED Notes (Signed)
The pts vit k has infused the pt is sleeping from the iv reglan he arrouses with no difficulty.  Pupils equal and react to ligh. 3.0 bilateral pupils he reports that his headache may be a little better but still severe. Moving all extremities.  Wife remains at the bedisde

## 2012-07-30 NOTE — Op Note (Signed)
Preoperative diagnosis: Right-sided subacute subdural hematoma  Postoperative diagnosis: Same  Procedure: Right side craniotomy for evacuation of right subacute subdural hematoma  Anesthesia: Gen.  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 who presented to emergency department earlier this morning with worsening headache or 5 weeks workup revealed a large subacute subdural hematoma with midline shift the patient was on Coumadin admission INR was 2.8 he was reversed throughout the morning and taken to the OR for emergent evacuation of her right subacute subdural hematoma risks benefits of the operation as well as perioperative course expectations about alternatives of surgery also the patient and family they understood and agreed to proceed forward the  Operative procedure: Patient brought into the or was induced under general anesthesia positioned in [place] pins with a shoulder bump under his right shoulder his head turned the left exposing his right parietal lobe a horseshoe incision was drawn out and after infiltration of 10 cc lidocaine with epi and after adequate prepping and draping the the scalp was incised a horseshoe fashion a cranial flap was turned the dura was a markedly tense I dura was opened up flapping against the pedicle of horseshoe immediate old blood under pressure came out the brain released or pulsating some medial bleeders were coagulated along the dura as well as along the cortical surface subcortical membrane subdural membrane was stripped away with the tissue forceps a wound scope to irrigate the brain and the right frontal parietal and occipital lobes were explored to confirm adequate decompression of all hematoma no additional cortical bleeders were visualized a drain was then placed and the dura was reapproximated then the flap is back replacement Biomet plating system the scalp was closed with interrupted Vicryl and  staples in the skin at the end of case on it counts sponge counts were correct per the nurses.

## 2012-07-30 NOTE — Progress Notes (Signed)
Subjective: Patient reports Was headache nausea slowly improved  Objective: Vital signs in last 24 hours: Temp:  [97 F (36.1 C)-100.3 F (37.9 C)] 97.7 F (36.5 C) (08/13 0850) Pulse Rate:  [69-86] 69  (08/13 0900) Resp:  [12-22] 14  (08/13 0900) BP: (113-147)/(59-87) 146/81 mmHg (08/13 0900) SpO2:  [94 %-98 %] 96 % (08/13 0900) Weight:  [100.3 kg (221 lb 1.9 oz)] 100.3 kg (221 lb 1.9 oz) (08/13 0300)  Intake/Output from previous day: 08/12 0701 - 08/13 0700 In: 406.7 [I.V.:406.7] Out: 300 [Urine:300] Intake/Output this shift: Total I/O In: 816.4 [I.V.:200; Blood:616.4] Out: 250 [Urine:250]  He is awake he is alert strength is 5 out of 5 no pronator drift  Lab Results:  Basename 07/30/12 0400 07/29/12 2326  WBC 7.2 7.9  HGB 11.8* 12.1*  HCT 34.5* 35.4*  PLT 122* 126*   BMET  Basename 07/30/12 0400 07/29/12 2326  NA 142 143  K 4.2 4.3  CL 108 107  CO2 27 28  GLUCOSE 112* 116*  BUN 14 15  CREATININE 1.06 1.15  CALCIUM 8.8 9.4    Studies/Results: Ct Head Wo Contrast  07/30/2012  *RADIOLOGY REPORT*  Clinical Data: Headache  CT HEAD WITHOUT CONTRAST  Technique:  Contiguous axial images were obtained from the base of the skull through the vertex without contrast.  Comparison: None.  Findings: There is a large mixed attenuation right subdural hematoma spanning from the frontal region and anterior falx through the temporal and parietal regions, measuring up to 2 cm  in thickness, and exerting significant mass effect on the right cerebral hemisphere, resulting in subfalcine herniation. There is up to 12 mm midline shift from right to left.  No left-sided or intraventricular hemorrhage is evident.  No focal parenchymal hypoattenuation.  Acute infarct may be inapparent on noncontrast CT.  Bone windows show no calvarial lesion.  There is compression of the right lateral ventricle and some dilatation of the left lateral ventricle.  IMPRESSION:  1.  Large right mixed attenuation  subdural hematoma resulting in 12 mm midline shift, subfalcine herniation, and early hydrocephalus. I telephoned the critical test results to Dr. Fonnie Jarvis at the time of interpretation.  Original Report Authenticated By: Osa Craver, M.D.    Assessment/Plan: 71 year old gentleman with a large right-sided subdural hematoma presents for craniotomy for evacuation of a right subacute subdural hematoma have extensively reviewed the risks and benefits of the operation with the patient and his wife and family including perioperative course and expectations of outcome alternatives of surgery and difficulties in with bleeding in the setting of chronic anticoagulation. They understand and agree to proceed forward.  LOS: 1 day     Jamarquis Crull P 07/30/2012, 9:48 AM

## 2012-07-30 NOTE — ED Notes (Signed)
Admitting doctor here to see.  The pt is resting at present

## 2012-07-30 NOTE — Anesthesia Procedure Notes (Signed)
Procedure Name: Intubation Date/Time: 07/30/2012 10:08 AM Performed by: Marena Chancy Pre-anesthesia Checklist: Patient identified, Emergency Drugs available, Suction available, Patient being monitored and Timeout performed Patient Re-evaluated:Patient Re-evaluated prior to inductionOxygen Delivery Method: Circle system utilized Preoxygenation: Pre-oxygenation with 100% oxygen Intubation Type: IV induction Ventilation: Oral airway inserted - appropriate to patient size and Mask ventilation without difficulty Laryngoscope Size: Mac and 3 Grade View: Grade II Tube type: Oral Tube size: 7.5 mm Number of attempts: 1 Airway Equipment and Method: Stylet and Oral airway Placement Confirmation: ETT inserted through vocal cords under direct vision,  positive ETCO2 and breath sounds checked- equal and bilateral Secured at: 23 cm Tube secured with: Tape Dental Injury: Teeth and Oropharynx as per pre-operative assessment

## 2012-07-30 NOTE — ED Notes (Signed)
pts speech clear moving all his extremities without difficulty

## 2012-07-30 NOTE — Consult Note (Signed)
Name: [name] MRN: [id number] DOB: Jun 17, 1941    LOS: 1  Referring Provider:  [name]/Garba Reason for Referral:  SDH  PULMONARY / CRITICAL CARE MEDICINE  HPI:  This is a 71 y/o male with A-fib on warfarin who came to the [facility] ED On 07/29/12 with headache for several weeks.  His wife believes that the symptom started 5 weeks prior to admission and he was evaluated by his PCP who ruled out tension headache, migraine and other causes of headache.  This week the symptom became so bad that she forced him to come to the ED for evaluation prior to his scheduled outpatient NSGY evaluation for the headache.  He had no preceding nausea, vomiting, weakness, numbness or speech abnormality.  He received reglan IV in the ED and is now more somnolent.  His wife denies any history of head trauma.  PCCM was asked to admit after NSGY evaluation.  Past Medical History  Diagnosis Date  . Pacemaker     PPM - St. Jude  . Atrial fibrillation   . CAD (coronary artery disease), native coronary artery   . DM (diabetes mellitus)   . Hyperlipidemia type II   . OSA (obstructive sleep apnea)   . BPH (benign prostatic hyperplasia)    Past Surgical History  Procedure Date  . Foot surgery   . Cholecystectomy   . Appendectomy   . Orchiectomy     Left  /  testicular cancer  . Hernia repair   . Coronary artery bypass graft     x3 (left internal mammary artery to distal left anterior descending coronary artery, saphenous vain graft to second circumflex marginal branch, saphenous vain graft to posterior descending coronary artery, endoscopic saphenous vain harvest from right thigh) and modified Cox - Maze IV procedure.  [name]. [name],MD. Electronically signed CHO/MEDQ D: 01/09/2008/ JOB: [id number] cc:  Antionette Char MD  . Pacemaker placement     PPM - St. Jude   Prior to Admission medications   Medication Sig Start Date End Date Taking? Authorizing Provider  dronedarone (MULTAQ) 400 MG tablet Take 1 tablet (400  mg total) by mouth 2 (two) times daily with a meal. 10/13/11  Yes Lewayne Bunting, MD  furosemide (LASIX) 40 MG tablet Take 40 mg by mouth. Take 3-4 times per week, as needed. For fluid   Yes Historical Provider, MD  metoprolol (LOPRESSOR) 50 MG tablet Take 1 tablet (50 mg total) by mouth 2 (two) times daily. 04/19/12  Yes Lewayne Bunting, MD  omega-3 acid ethyl esters (LOVAZA) 1 G capsule Take 2 g by mouth 3 (three) times daily.   Yes Historical Provider, MD  potassium chloride SA (K-DUR,KLOR-CON) 20 MEQ tablet Take 20 mEq by mouth daily.     Yes Historical Provider, MD  rosuvastatin (CRESTOR) 10 MG tablet Take 10 mg by mouth daily.     Yes Historical Provider, MD  sertraline (ZOLOFT) 100 MG tablet Take 100 mg by mouth daily.   Yes Historical Provider, MD  Tamsulosin HCl (FLOMAX) 0.4 MG CAPS Take by mouth at bedtime.    Yes Historical Provider, MD  warfarin (COUMADIN) 5 MG tablet Take 1 tablet (5 mg total) by mouth as directed. Take as directed by coumadin clinic, this is a 90 supply 04/19/12  Yes Lewayne Bunting, MD   Allergies No Known Allergies  Family History Family History  Problem Relation Age of Onset  . Heart disease Father   . Heart attack Father  Social History  reports that he quit smoking about 21 years ago. He does not have any smokeless tobacco history on file. He reports that he does not drink alcohol or use illicit drugs.  Review Of Systems:  Cannot obtain due to somnolence  Brief patient description:  71 y/o male on warfarin for a-fib admitted through the [facility] ED on 07/29/12 for a large SDH with midline shift which appears to have evolved over 5 weeks.  Events Since Admission: 07/30/12 >> Large R mixed attenuation SDU with 12 mm midline shift and early hydrocephalus.    Current Status:  Vital Signs: Temp:  [97 F (36.1 C)-98.5 F (36.9 C)] 97 F (36.1 C) (08/12 2328) Pulse Rate:  [70-74] 70  (08/13 0040) Resp:  [18-22] 22  (08/13 0040) BP: (128-144)/(77-78) 130/77  mmHg (08/13 0040) SpO2:  [96 %-98 %] 96 % (08/13 0040)  Physical Examination: Gen: somnolent but arouses, moves all four ext HEENT: NCAT, PERRL, EOMi, OP clear, neck supple without masses PULM: CTA B CV: RRR on my exam, no mgr, no JVD AB: BS+, soft, nontender, no hsm Ext: warm, no edema, no clubbing, no cyanosis Derm: no rash or skin breakdown Neuro: Somnolent but arouses and is Orientedx4, CN II-XII intact, strength 5/5 in all 4 extremities   Principal Problem:  *Subdural hematoma Active Problems:  DIABETES MELLITUS  HYPERTENSION  Atrial fibrillation   ASSESSMENT AND PLAN  PULMONARY No results found for this basename: PHART:5,PCO2:5,PCO2ART:5,PO2ART:5,HCO3:5,O2SAT:5 in the last 168 hours Ventilator Settings:   CXR:  pending ETT:  n/a  A:  OSA, otherwise stable from respiratory standpoint P:   -CPAP per home regimen -monitor O2 saturation, use pain medications appropriately but monitor for respiratory suppression  CARDIOVASCULAR No results found for this basename: TROPONINI:5,LATICACIDVEN:5, O2SATVEN:5,PROBNP:5 in the last 168 hours ECG:  Atrial paced, RBBB Lines: piv  A: A-fib, CAD, followed by [facility]; no evidence of ischaemia on admission P:  -tele -continue home dronedarone -continue home metoprolol  -continue home lasix -cardiology has been consulted for risk stratification  RENAL  Lab 07/29/12 2326  NA 143  K 4.3  CL 107  CO2 28  BUN 15  CREATININE 1.15  CALCIUM 9.4  MG --  PHOS --   Intake/Output    None    Foley:  n/a  A:  No acute issues P:   -monitor uop  GASTROINTESTINAL  Lab 07/29/12 2326  AST 26  ALT 18  ALKPHOS 70  BILITOT 0.6  PROT 7.7  ALBUMIN 4.0    A:  No acute issues P:   -npo this AM for OR later today  HEMATOLOGIC  Lab 07/29/12 2339 07/29/12 2326  HGB -- 12.1*  HCT -- 35.4*  PLT -- 126*  INR 2.81* --  APTT -- --   A:  Elevated INR from warfarin in setting of SDH P:  -warfarin correction  protocol (has received Vit K x1 and FEIBA x1) -f/u INR after FEIBA  INFECTIOUS  Lab 07/29/12 2326  WBC 7.9  PROCALCITON --   Cultures: None  Antibiotics: none  A:  No acute issues P:   -monitor fever curve  ENDOCRINE No results found for this basename: GLUCAP:5 in the last 168 hours A:  Diabetes Mellitus on problem list (diet controlled?) P:   -SSI -check Hgb A1c  NEUROLOGIC  A:  Large mixed density SDH, likely subacute based on 5 weeks of symptoms; currently somnolent after receiving IV reglan but otherwise neurologically intact. P:   -monitor neuro  status in ICU setting -NSGY to take to OR today for evacuation -see Heme  BEST PRACTICE / DISPOSITION Level of Care:  ICU Primary Service:  PCCM Consultants:  NSGY Code Status:  full Diet:  npo DVT Px:  SCD GI Px:  n/a Skin Integrity:  normal Social / Family:  Wife updated at bedside on admission  CC time 40 minutes.  Max Fickle, M.D. Pulmonary and Critical Care Medicine [facility] Pager: [phone]  07/30/2012, 1:32 AM

## 2012-07-30 NOTE — Consult Note (Signed)
Reason for Consult: Subdural hematoma Referring Physician: Emergency department  [name] is an 71 y.o. male.  HPI: [name] is a very pleasant 71 year old gentleman who presents with 5 weeks of headaches progressively worse over the last few weeks. He denies any particular inciting event no falls he knows of no other head trauma and he is aware of he also denies any numbness and tingling his face his arms or his legs denies any altered level of consciousness his wife reports he has had a little bit of a status of his gait but this was only in retrospect. Patient does have a significant past medical history of atrial fibrillation pacemaker placement he is on Coumadin his INR admission to the ER tonight was 2.8.  Past Medical History  Diagnosis Date  . Pacemaker     PPM - St. Jude  . Atrial fibrillation   . CAD (coronary artery disease), native coronary artery   . DM (diabetes mellitus)   . Hyperlipidemia type II   . OSA (obstructive sleep apnea)   . BPH (benign prostatic hyperplasia)     Past Surgical History  Procedure Date  . Foot surgery   . Cholecystectomy   . Appendectomy   . Orchiectomy     Left  /  testicular cancer  . Hernia repair   . Coronary artery bypass graft     x3 (left internal mammary artery to distal left anterior descending coronary artery, saphenous vain graft to second circumflex marginal branch, saphenous vain graft to posterior descending coronary artery, endoscopic saphenous vain harvest from right thigh) and modified Cox - Maze IV procedure.  [name]. [name],MD. Electronically signed CHO/MEDQ D: 01/09/2008/ JOB: [id number] cc:  Antionette Char MD  . Pacemaker placement     PPM - St. Jude    Family History  Problem Relation Age of Onset  . Heart disease Father   . Heart attack Father     Social History:  reports that he quit smoking about 21 years ago. He does not have any smokeless tobacco history on file. He reports that he does not drink alcohol  or use illicit drugs.  Allergies: No Known Allergies  Medications: I have reviewed the patient's current medications.  Results for orders placed during the hospital encounter of 07/29/12 (from the past 48 hour(s))  CBC     Status: Abnormal   Collection Time   07/29/12 11:26 PM      Component Value Range Comment   WBC 7.9  4.0 - 10.5 K/uL    RBC 3.96 (*) 4.22 - 5.81 MIL/uL    Hemoglobin 12.1 (*) 13.0 - 17.0 g/dL    HCT 91.4 (*) 78.2 - 52.0 %    MCV 89.4  78.0 - 100.0 fL    MCH 30.6  26.0 - 34.0 pg    MCHC 34.2  30.0 - 36.0 g/dL    RDW 95.6  21.3 - 08.6 %    Platelets 126 (*) 150 - 400 K/uL   COMPREHENSIVE METABOLIC PANEL     Status: Abnormal   Collection Time   07/29/12 11:26 PM      Component Value Range Comment   Sodium 143  135 - 145 mEq/L    Potassium 4.3  3.5 - 5.1 mEq/L    Chloride 107  96 - 112 mEq/L    CO2 28  19 - 32 mEq/L    Glucose, Bld 116 (*) 70 - 99 mg/dL    BUN 15  6 - 23 mg/dL    Creatinine, Ser 1.61  0.50 - 1.35 mg/dL    Calcium 9.4  8.4 - 09.6 mg/dL    Total Protein 7.7  6.0 - 8.3 g/dL    Albumin 4.0  3.5 - 5.2 g/dL    AST 26  0 - 37 U/L    ALT 18  0 - 53 U/L    Alkaline Phosphatase 70  39 - 117 U/L    Total Bilirubin 0.6  0.3 - 1.2 mg/dL    GFR calc non Af Amer 63 (*) >90 mL/min    GFR calc Af Amer 73 (*) >90 mL/min   SEDIMENTATION RATE     Status: Abnormal   Collection Time   07/29/12 11:26 PM      Component Value Range Comment   Sed Rate 20 (*) 0 - 16 mm/hr   PROTIME-INR     Status: Abnormal   Collection Time   07/29/12 11:39 PM      Component Value Range Comment   Prothrombin Time 30.0 (*) 11.6 - 15.2 seconds    INR 2.81 (*) 0.00 - 1.49     Ct Head Wo Contrast  07/30/2012  *RADIOLOGY REPORT*  Clinical Data: Headache  CT HEAD WITHOUT CONTRAST  Technique:  Contiguous axial images were obtained from the base of the skull through the vertex without contrast.  Comparison: None.  Findings: There is a large mixed attenuation right subdural hematoma  spanning from the frontal region and anterior falx through the temporal and parietal regions, measuring up to 2 cm  in thickness, and exerting significant mass effect on the right cerebral hemisphere, resulting in subfalcine herniation. There is up to 12 mm midline shift from right to left.  No left-sided or intraventricular hemorrhage is evident.  No focal parenchymal hypoattenuation.  Acute infarct may be inapparent on noncontrast CT.  Bone windows show no calvarial lesion.  There is compression of the right lateral ventricle and some dilatation of the left lateral ventricle.  IMPRESSION:  1.  Large right mixed attenuation subdural hematoma resulting in 12 mm midline shift, subfalcine herniation, and early hydrocephalus. I telephoned the critical test results to Dr. Fonnie Jarvis at the time of interpretation.  Original Report Authenticated By: Thora Lance III, M.D.    @ROS @ Blood pressure 130/77, pulse 70, temperature 97 F (36.1 C), temperature source Oral, resp. rate 22, SpO2 96.00%. Patient is awake he still somnolent but this is only after he received Reglan and top of Zofran and morphine for headache and nausea he is oriented x3 his strength is 5 out of 5 in his upper and lower 70s with no evidence of a pronator drift his pupils are equal and reactive extraocular movements are intact.  Assessment/Plan: Patient is a 71 year old gentleman presents with a large right-sided subacute mixed density subdural hematoma with multiple septations and membranes. This will need a craniotomy for evacuation of subdural. The patient will need to be admitted to the ICU most likely on the critical care service as well as be seen by [facility]ology for preoperative clearance we are reversing his coagulopathy utilizing the rapid reversal protocol when his INR is reversed and he has had adequate operation Biomet the medical team who will perform a craniotomy later on today. I have described the procedure to the patient  and his wife they understand and agree to proceed forward.  [name] 07/30/2012, 1:14 AM

## 2012-07-30 NOTE — H&P (Signed)
[name] is an 71 y.o. male.   Chief Complaint: Headache HPI: 70 year old gentleman with known history of coronary artery disease and atrial fibrillation on Coumadin who has been having progressive headaches for the past 3 weeks. His INR has been therapeutic between 2.0 and 3.0. He denied any trauma. No any type of injury. He was seen in the ED today with persistent headache but no neurologic findings. No weakness no paresthesias. His evaluation with a CT head shows right-sided subdural hematoma with midline shift. Neurosurgery has been consulted and recommended admission by internal medicine to reverse his INR. Patient is currently stable only complaining of headache that is 8/10 mainly on the right but also generalized at the moment. Associated photophobia no rhinorrhea. No visual changes otherwise. He has other multiple medical problems including diabetes hypertension and obstructive sleep Apnea.  Past Medical History  Diagnosis Date  . Pacemaker     PPM - St. Jude  . Atrial fibrillation   . CAD (coronary artery disease), native coronary artery   . DM (diabetes mellitus)   . Hyperlipidemia type II   . OSA (obstructive sleep apnea)   . BPH (benign prostatic hyperplasia)     Past Surgical History  Procedure Date  . Foot surgery   . Cholecystectomy   . Appendectomy   . Orchiectomy     Left  /  testicular cancer  . Hernia repair   . Coronary artery bypass graft     x3 (left internal mammary artery to distal left anterior descending coronary artery, saphenous vain graft to second circumflex marginal branch, saphenous vain graft to posterior descending coronary artery, endoscopic saphenous vain harvest from right thigh) and modified Cox - Maze IV procedure.  Jamie Decent. [name],MD. Electronically signed CHO/MEDQ D: 01/09/2008/ JOB: [id number] cc:  Antionette Char MD  . Pacemaker placement     PPM - St. Jude    Family History  Problem Relation Age of Onset  . Heart disease Father   .  Heart attack Father    Social History:  reports that he quit smoking about 21 years ago. He does not have any smokeless tobacco history on file. He reports that he does not drink alcohol or use illicit drugs.  Allergies: No Known Allergies   (Not in a hospital admission)  Results for orders placed during the hospital encounter of 07/29/12 (from the past 48 hour(s))  CBC     Status: Abnormal   Collection Time   07/29/12 11:26 PM      Component Value Range Comment   WBC 7.9  4.0 - 10.5 K/uL    RBC 3.96 (*) 4.22 - 5.81 MIL/uL    Hemoglobin 12.1 (*) 13.0 - 17.0 g/dL    HCT 11.9 (*) 14.7 - 52.0 %    MCV 89.4  78.0 - 100.0 fL    MCH 30.6  26.0 - 34.0 pg    MCHC 34.2  30.0 - 36.0 g/dL    RDW 82.9  56.2 - 13.0 %    Platelets 126 (*) 150 - 400 K/uL   COMPREHENSIVE METABOLIC PANEL     Status: Abnormal   Collection Time   07/29/12 11:26 PM      Component Value Range Comment   Sodium 143  135 - 145 mEq/L    Potassium 4.3  3.5 - 5.1 mEq/L    Chloride 107  96 - 112 mEq/L    CO2 28  19 - 32 mEq/L    Glucose, Bld  116 (*) 70 - 99 mg/dL    BUN 15  6 - 23 mg/dL    Creatinine, Ser 1.30  0.50 - 1.35 mg/dL    Calcium 9.4  8.4 - 86.5 mg/dL    Total Protein 7.7  6.0 - 8.3 g/dL    Albumin 4.0  3.5 - 5.2 g/dL    AST 26  0 - 37 U/L    ALT 18  0 - 53 U/L    Alkaline Phosphatase 70  39 - 117 U/L    Total Bilirubin 0.6  0.3 - 1.2 mg/dL    GFR calc non Af Amer 63 (*) >90 mL/min    GFR calc Af Amer 73 (*) >90 mL/min   SEDIMENTATION RATE     Status: Abnormal   Collection Time   07/29/12 11:26 PM      Component Value Range Comment   Sed Rate 20 (*) 0 - 16 mm/hr   PROTIME-INR     Status: Abnormal   Collection Time   07/29/12 11:39 PM      Component Value Range Comment   Prothrombin Time 30.0 (*) 11.6 - 15.2 seconds    INR 2.81 (*) 0.00 - 1.49    Ct Head Wo Contrast  07/30/2012  *RADIOLOGY REPORT*  Clinical Data: Headache  CT HEAD WITHOUT CONTRAST  Technique:  Contiguous axial images were obtained  from the base of the skull through the vertex without contrast.  Comparison: None.  Findings: There is a large mixed attenuation right subdural hematoma spanning from the frontal region and anterior falx through the temporal and parietal regions, measuring up to 2 cm  in thickness, and exerting significant mass effect on the right cerebral hemisphere, resulting in subfalcine herniation. There is up to 12 mm midline shift from right to left.  No left-sided or intraventricular hemorrhage is evident.  No focal parenchymal hypoattenuation.  Acute infarct may be inapparent on noncontrast CT.  Bone windows show no calvarial lesion.  There is compression of the right lateral ventricle and some dilatation of the left lateral ventricle.  IMPRESSION:  1.  Large right mixed attenuation subdural hematoma resulting in 12 mm midline shift, subfalcine herniation, and early hydrocephalus. I telephoned the critical test results to Dr. Fonnie Jarvis at the time of interpretation.  Original Report Authenticated By: Osa Craver, M.D.    Review of Systems  Constitutional: Negative.   HENT: Positive for congestion.   Eyes: Positive for blurred vision.  Respiratory: Negative.   Cardiovascular: Negative.   Gastrointestinal: Negative.   Genitourinary: Negative.   Musculoskeletal: Negative.   Neurological: Positive for headaches.  Endo/Heme/Allergies: Negative.   Psychiatric/Behavioral: Negative.     Blood pressure 130/77, pulse 70, temperature 97 F (36.1 C), temperature source Oral, resp. rate 22, SpO2 96.00%. Physical Exam  Constitutional: He is oriented to person, place, and time. He appears well-developed and well-nourished.  HENT:  Head: Normocephalic and atraumatic.  Right Ear: External ear normal.  Left Ear: External ear normal.  Nose: Nose normal.  Mouth/Throat: Oropharynx is clear and moist.  Eyes: Conjunctivae and EOM are normal. Pupils are equal, round, and reactive to light.  Neck: Normal range of  motion. Neck supple.  Cardiovascular: Normal rate, normal heart sounds and intact distal pulses.  An irregularly irregular rhythm present.  Respiratory: Effort normal and breath sounds normal.  GI: Soft. Bowel sounds are normal.  Musculoskeletal: Normal range of motion.  Neurological: He is alert and oriented to person, place, and time. He  has normal reflexes.  Skin: Skin is warm and dry.  Psychiatric: He has a normal mood and affect. His behavior is normal. Judgment and thought content normal.     Assessment/Plan A 71 year old gentleman being admitted with a large right-sided subdural hematoma secondary to Coumadin usage. His INR is 2.8 at the moment.  Plan #1 subdural hematoma: Patient will be admitted to step down unit. We will give her some FFP or FIBA AS WELL as vitamin K to reverse his INR quickly. Will monitor him neurologically and await neurosurgical consult for possible intervention. We will repeat head CT in the morning once his nares reverse. To see the progression. More than likely this is not in acute hematoma but subacute that may have started about 3 weeks ago.  #2 atrial fibrillation: Patient's rate is controlled but will take him off the Coumadin for now. He'll probably not be able to get back on Coumadin anymore or any other blood thinners due to his bleed.  #3 diabetes: We'll put him on sliding scale insulin as well as his home therapy.  #4 hypertension: Blood pressure is well controlled on his home regimen which we'll continue it.  #5 coronary artery disease: Patient will be on telemetry monitoring. No chest pain.  #6 sleep apnea: Patient will be placed on CPAP at night if needed.  #7 DVT prophylaxis: We will use SCD and no anticoagulants.  GARBA,LAWAL 07/30/2012, 1:04 AM

## 2012-07-30 NOTE — Progress Notes (Signed)
UR completed 

## 2012-07-30 NOTE — ED Notes (Signed)
The pt is still c/o his headache returning from c-t .  reglan given iv. Pt appears more relaxed

## 2012-07-30 NOTE — Preoperative (Signed)
Beta Blockers   Reason not to administer Beta Blockers:Not Applicable 

## 2012-07-30 NOTE — Anesthesia Preprocedure Evaluation (Addendum)
Anesthesia Evaluation  Patient identified by MRN, date of birth, ID band Patient awake    Reviewed: Allergy & Precautions, H&P , NPO status , Patient's Chart, lab work & pertinent test results  Airway Mallampati: I TM Distance: >3 FB Neck ROM: Full    Dental  (+) Teeth Intact and Dental Advisory Given   Pulmonary  breath sounds clear to auscultation        Cardiovascular hypertension, Pt. on medications and Pt. on home beta blockers + CAD + dysrhythmias + pacemaker Rhythm:Regular Rate:Normal     Neuro/Psych    GI/Hepatic   Endo/Other  Type 2, Insulin DependentMorbid obesity  Renal/GU      Musculoskeletal   Abdominal   Peds  Hematology   Anesthesia Other Findings   Reproductive/Obstetrics                           Anesthesia Physical Anesthesia Plan  ASA: IV  Anesthesia Plan: General   Post-op Pain Management:    Induction: Intravenous  Airway Management Planned: Oral ETT  Additional Equipment: Arterial line  Intra-op Plan:   Post-operative Plan: Extubation in OR  Informed Consent: I have reviewed the patients History and Physical, chart, labs and discussed the procedure including the risks, benefits and alternatives for the proposed anesthesia with the patient or authorized representative who has indicated his/her understanding and acceptance.   Dental advisory given  Plan Discussed with: CRNA, Anesthesiologist and Surgeon  Anesthesia Plan Comments:        Anesthesia Quick Evaluation

## 2012-07-30 NOTE — ED Notes (Signed)
Pt rubbing his head cursing about the pain. He has an appointment next week with his doctor.  The pts wife reports she was unable to convince him to see a doctor

## 2012-07-30 NOTE — Anesthesia Postprocedure Evaluation (Signed)
  Anesthesia Post-op Note  Patient: [name]  Procedure(s) Performed: Procedure(s) (LRB): CRANIOTOMY HEMATOMA EVACUATION SUBDURAL (Right)  Patient Location: PACU  Anesthesia Type: General  Level of Consciousness: awake and alert   Airway and Oxygen Therapy: Patient Spontanous Breathing and Patient connected to nasal cannula oxygen  Post-op Pain: mild  Post-op Assessment: Post-op Vital signs reviewed  Post-op Vital Signs: Reviewed  Complications: No apparent anesthesia complications

## 2012-07-30 NOTE — ED Notes (Signed)
Report called to [phone] 

## 2012-07-31 ENCOUNTER — Inpatient Hospital Stay (HOSPITAL_COMMUNITY): Payer: Medicare Other

## 2012-07-31 DIAGNOSIS — G9389 Other specified disorders of brain: Secondary | ICD-10-CM | POA: Diagnosis not present

## 2012-07-31 DIAGNOSIS — I6200 Nontraumatic subdural hemorrhage, unspecified: Secondary | ICD-10-CM | POA: Diagnosis not present

## 2012-07-31 LAB — PROTIME-INR
INR: 1.27 (ref 0.00–1.49)
Prothrombin Time: 16.2 seconds — ABNORMAL HIGH (ref 11.6–15.2)

## 2012-07-31 LAB — PREPARE FRESH FROZEN PLASMA
Unit division: 0
Unit division: 0

## 2012-07-31 LAB — GLUCOSE, CAPILLARY
Glucose-Capillary: 119 mg/dL — ABNORMAL HIGH (ref 70–99)
Glucose-Capillary: 111 mg/dL — ABNORMAL HIGH (ref 70–99)

## 2012-07-31 NOTE — Consult Note (Signed)
Name: [name] MRN: [id number] DOB: 11/22/1941    LOS: 2  Referring Provider:  Sabino Niemann Reason for Referral:  SDH  PULMONARY / CRITICAL CARE MEDICINE  Brief patient description:  71 y/o male on warfarin for a-fib admitted through the [facility] ED on 07/29/12 for a large SDH with midline shift which appears to have evolved over 5 weeks.  Events Since Admission: 8/13 Large R mixed attenuation SDU with 12 mm midline shift and early hydrocephalus.  OR for evacuation.  Current Status:  No issues overnight.  Mild headache.  OK to transfer out of ICU per Neurosurgery.  Vital Signs: Temp:  [97.1 F (36.2 C)-98.9 F (37.2 C)] 98.2 F (36.8 C) (08/14 0800) Pulse Rate:  [69-81] 70  (08/14 0900) Resp:  [6-22] 11  (08/14 0900) BP: (105-139)/(32-95) 116/66 mmHg (08/14 0900) SpO2:  [87 %-100 %] 95 % (08/14 0900) Arterial Line BP: (147-199)/(67-81) 160/73 mmHg (08/14 0900) Weight:  [102.8 kg (226 lb 10.1 oz)] 102.8 kg (226 lb 10.1 oz) (08/14 0500)  Physical Examination: Gen: No distress HEENT: PERRL, surgical dressing clean, dry, intact PULM: CTAB CV: RRR, no murmurs AB: Soft, nontender, bowel sounds present Ext: Warm, no edema, no clubbing, no cyanosis Derm: No rash or skin breakdown Neuro: Awake, alert, nonfocal  Principal Problem:  *Subdural hematoma Active Problems:  DIABETES MELLITUS  HYPERTENSION  Atrial fibrillation  CAD (coronary artery disease), native coronary artery  DM (diabetes mellitus)  OSA (obstructive sleep apnea)  ASSESSMENT AND PLAN  PULMONARY No results found for this basename: PHART:5,PCO2:5,PCO2ART:5,PO2ART:5,HCO3:5,O2SAT:5 in the last 168 hours  Ventilator Settings:   CXR:  NA ETT:  NA  A:  OSA, otherwise stable from respiratory standpoint P:   Goal SpO2 > 92 CPAP per home regimen when able Supplemental oxygen PRN  CARDIOVASCULAR No results found for this basename: TROPONINI:5,LATICACIDVEN:5, O2SATVEN:5,PROBNP:5 in the last 168 hours  ECG:   Atrial paced, RBBB Lines: NA  A: A-fib, CAD, followed by [facility]; no evidence of ischemia on admission. P:  Telemetry Continue home Dronedarone, Metoprolol, Lipitor  RENAL  Lab 07/30/12 1330 07/30/12 0400 07/29/12 2326  NA 143 142 143  K 3.8 4.2 --  CL 109 108 107  CO2 27 27 28   BUN 11 14 15   CREATININE 0.97 1.06 1.15  CALCIUM 8.2* 8.8 9.4  MG -- -- --  PHOS -- -- --   Intake/Output      08/13 0701 - 08/14 0700 08/14 0701 - 08/15 0700   P.O. 350 240   I.V. (mL/kg) 2723.8 (26.5) 150 (1.5)   Blood 616.4    IV Piggyback 305    Total Intake(mL/kg) 3995.2 (38.9) 390 (3.8)   Urine (mL/kg/hr) 2555 (1) 110   Drains 150 0   Blood 100    Total Output 2805 110   Net +1190.2 +280         Foley:  NA  A:  No acute issues. P:   Trend BMP D/c IVF Lasix per home regimen K 20 daily  GASTROINTESTINAL  Lab 07/30/12 0400 07/29/12 2326  AST 25 26  ALT 17 18  ALKPHOS 67 70  BILITOT 0.6 0.6  PROT 7.1 7.7  ALBUMIN 3.7 4.0   A:  No acute issues. P:   Carb modified diet  HEMATOLOGIC  Lab 07/31/12 0334 07/30/12 2200 07/30/12 1230 07/30/12 1000 07/30/12 0400 07/29/12 2326  HGB -- -- 10.8* -- 11.8* 12.1*  HCT -- -- 31.4* -- 34.5* 35.4*  PLT -- -- 143* -- 122*  126*  INR 1.27 1.35 1.42 1.32 1.42 --  APTT -- -- -- 32 -- --   A:  Coumadin-induced coagulopathy  in setting of SDH, reversed. P:  Unlikely to be able to resume anticoagulation  INFECTIOUS  Lab 07/30/12 1230 07/30/12 0400 07/29/12 2326  WBC 6.7 7.2 7.9  PROCALCITON -- -- --   Cultures: None  Antibiotics: None  A:  No acute issues. P:   No interventions required  ENDOCRINE  Lab 07/31/12 0926 07/30/12 2158 07/30/12 1701 07/30/12 1213 07/30/12 0820  GLUCAP 119* 102* 91 101* 109*   A:  Diabetes Mellitus on problem list (diet controlled?), normoglycemic. P:   D/c SSI  NEUROLOGIC  A:  Large mixed density subacute SDH, s/p evacuation. P:   Per Neurosurgery Zoloft as  preadmission PT/OT  BEST PRACTICE / DISPOSITION Level of Care:  Downgrade to telemetry 8/14 Primary Service:  TRH starting 8/25, PCCM will sign off Consultants:  Neurosurgery Code Status:  Full Diet:  Carb modified DVT Px:  SCD GI Px:  Not indicated Skin Integrity:  No issues Social / Family:  Family updated at bedside  Lonia Farber, M.D. Pulmonary and Critical Care Medicine [facility] Pager: [phone]  07/31/2012, 10:16 AM

## 2012-07-31 NOTE — Progress Notes (Signed)
Subjective: Patient reports Days feeling better and had a no numbness tingling his arms or his legs no nausea  Objective: Vital signs in last 24 hours: Temp:  [97.1 F (36.2 C)-98.9 F (37.2 C)] 97.7 F (36.5 C) (08/14 0400) Pulse Rate:  [69-86] 69  (08/14 0700) Resp:  [6-22] 11  (08/14 0700) BP: (115-147)/(32-95) 126/71 mmHg (08/14 0700) SpO2:  [87 %-100 %] 93 % (08/14 0700) Arterial Line BP: (147-199)/(67-81) 155/70 mmHg (08/14 0700) Weight:  [102.8 kg (226 lb 10.1 oz)] 102.8 kg (226 lb 10.1 oz) (08/14 0500)  Intake/Output from previous day: 08/13 0701 - 08/14 0700 In: 3995.2 [P.O.:350; I.V.:2723.8; Blood:616.4; IV Piggyback:305] Out: 2805 [Urine:2555; Drains:150; Blood:100] Intake/Output this shift:    Awake alert oriented strength out of 5 no pronator drift mild saturation of his dressing cranial wrap  Lab Results:  Basename 07/30/12 1230 07/30/12 0400  WBC 6.7 7.2  HGB 10.8* 11.8*  HCT 31.4* 34.5*  PLT 143* 122*   BMET  Basename 07/30/12 1330 07/30/12 0400  NA 143 142  K 3.8 4.2  CL 109 108  CO2 27 27  GLUCOSE 106* 112*  BUN 11 14  CREATININE 0.97 1.06  CALCIUM 8.2* 8.8    Studies/Results: Ct Head Wo Contrast  07/31/2012  *RADIOLOGY REPORT*  Clinical Data: Subdural hematoma.  Craniotomy.  CT HEAD WITHOUT CONTRAST  Technique:  Contiguous axial images were obtained from the base of the skull through the vertex without contrast.  Comparison: 07/30/2012.  Findings: Midline shift has decreased, today measuring 5 mm from right to left.  Postoperative changes of decompression of the right subdural hematoma with surgical drain along the right cerebral convexity.  Mixed attenuation blood products remain in the right subdural space.  Subdural blood layers along the falx.  Right posterior frontal and parietal craniotomy.  Suprasellar cistern appears patent.  There is no herniation identified.  Mastoid air cells appear within normal limits.  IMPRESSION: Postoperative changes  of right subdural hematoma drainage with a small amount of mixed blood products remain along the right cerebral convexity with pneumocephalus.  Drain remains in place. Midline shift from right to left has decreased to 5 mm.  Original Report Authenticated By: Andreas Newport, M.D.   Ct Head Wo Contrast  07/30/2012  *RADIOLOGY REPORT*  Clinical Data: Headache  CT HEAD WITHOUT CONTRAST  Technique:  Contiguous axial images were obtained from the base of the skull through the vertex without contrast.  Comparison: None.  Findings: There is a large mixed attenuation right subdural hematoma spanning from the frontal region and anterior falx through the temporal and parietal regions, measuring up to 2 cm  in thickness, and exerting significant mass effect on the right cerebral hemisphere, resulting in subfalcine herniation. There is up to 12 mm midline shift from right to left.  No left-sided or intraventricular hemorrhage is evident.  No focal parenchymal hypoattenuation.  Acute infarct may be inapparent on noncontrast CT.  Bone windows show no calvarial lesion.  There is compression of the right lateral ventricle and some dilatation of the left lateral ventricle.  IMPRESSION:  1.  Large right mixed attenuation subdural hematoma resulting in 12 mm midline shift, subfalcine herniation, and early hydrocephalus. I telephoned the critical test results to Dr. Fonnie Jarvis at the time of interpretation.  Original Report Authenticated By: Osa Craver, M.D.    Assessment/Plan: Posterior day 1 craniotomy for evacuation of a right subacute subdural hematoma doing fairly well followup CAT scan shows significant Lee improved decompression  of his right hemisphere and significantly improved midline shift will DC his saline DC his Foley move progress immobilize him out of bed with physical and occupational therapy advance his diet  LOS: 2 days     Haizlee Henton P 07/31/2012, 8:08 AM

## 2012-08-01 ENCOUNTER — Encounter (HOSPITAL_COMMUNITY): Payer: Self-pay | Admitting: Neurosurgery

## 2012-08-01 DIAGNOSIS — I4891 Unspecified atrial fibrillation: Secondary | ICD-10-CM | POA: Diagnosis not present

## 2012-08-01 DIAGNOSIS — I2510 Atherosclerotic heart disease of native coronary artery without angina pectoris: Secondary | ICD-10-CM | POA: Diagnosis not present

## 2012-08-01 DIAGNOSIS — E119 Type 2 diabetes mellitus without complications: Secondary | ICD-10-CM | POA: Diagnosis not present

## 2012-08-01 DIAGNOSIS — I6200 Nontraumatic subdural hemorrhage, unspecified: Secondary | ICD-10-CM | POA: Diagnosis not present

## 2012-08-01 MED ORDER — POLYETHYLENE GLYCOL 3350 17 G PO PACK
17.0000 g | PACK | Freq: Every day | ORAL | Status: DC
  Administered 2012-08-01 – 2012-08-02 (×2): 17 g via ORAL
  Filled 2012-08-01: qty 1

## 2012-08-01 NOTE — Evaluation (Signed)
Physical Therapy Evaluation Patient Details Name: [name] MRN: [id number] DOB: 1941-02-02 Today's Date: 08/01/2012 Time: 8295-6213 PT Time Calculation (min): 25 min  PT Assessment / Plan / Recommendation Clinical Impression  Pt s/p SDH(spontaneous) requiring craniotomy. Pt with good strength and coordination although impairments during ambulation including discoordination and difficulty with motor planning. Pt will benefit from skilled PT in the acute care setting in order to maximize functional mobility and safety prior to d/c with spouse.    PT Assessment  Patient needs continued PT services    Follow Up Recommendations  Home health PT;Supervision for mobility/OOB       Equipment Recommendations  None recommended by PT;None recommended by OT       Frequency Min 4X/week    Precautions / Restrictions Precautions Precautions: Fall Precaution Comments: drain from craniotomy Restrictions Weight Bearing Restrictions: No         Mobility  Bed Mobility Bed Mobility: Sit to Supine;Scooting to HOB Supine to Sit: 4: Min assist;With rails;HOB elevated Sitting - Scoot to Edge of Bed: 4: Min assist;With rail Sit to Supine: 5: Supervision Scooting to [facility]: 4: Min assist;2: Max assist Details for Bed Mobility Assistance: VC for proper sequencing into bed, pt requested to raise bed(his bed at home adjusts). Intermittent assistance towards HOB with drawsheet, cueing for UE and LE assist towards HOB. Transfers Transfers: Stand to Sit;Sit to Stand Sit to Stand: 4: Min assist;With upper extremity assist;From bed;From chair/3-in-1;2: Max assist Stand to Sit: 4: Min assist;With armrests;3: Mod assist;To bed Details for Transfer Assistance: VC for hand placement. Upon first stand, pt unable to achieve full stand and required physical assist to control descent back into chair, unsure if strength or motor planning deficits. VC for anterior weight shift on second stand, pt able to achieve  full upright position with only min assist.  Ambulation/Gait Ambulation/Gait Assistance: 3: Mod assist;2: Max assist Ambulation Distance (Feet): 115 Feet Assistive device: Other (Comment) (IV Pole) Ambulation/Gait Assistance Details: VC for proper sequencing and placement throughout ambulation from chair to bathroom. Pt with difficulty figuring out how to manuever throughout room, cues for navigation. Pt with shuffling steps, increased with distance. Pt with intermittent loss of balance and uncoordinated steps.  Gait Pattern: Step-to pattern;Decreased step length - right;Decreased step length - left;Decreased hip/knee flexion - right;Decreased hip/knee flexion - left;Decreased weight shift to right;Decreased weight shift to left;Shuffle;Narrow base of support Gait velocity: decreased        PT Diagnosis: Difficulty walking;Abnormality of gait;Acute pain  PT Problem List: Decreased activity tolerance;Decreased balance;Decreased mobility;Decreased coordination;Decreased knowledge of use of DME;Decreased safety awareness;Pain;Other (comment) (Motor planning) PT Treatment Interventions: DME instruction;Gait training;Stair training;Functional mobility training;Therapeutic activities;Balance training;Neuromuscular re-education;Patient/family education   PT Goals Acute Rehab PT Goals PT Goal Formulation: With patient/family Time For Goal Achievement: 08/08/12 Potential to Achieve Goals: Fair Pt will go Supine/Side to Sit: with modified independence PT Goal: Supine/Side to Sit - Progress: Goal set today Pt will go Sit to Supine/Side: with modified independence PT Goal: Sit to Supine/Side - Progress: Goal set today Pt will go Sit to Stand: with supervision PT Goal: Sit to Stand - Progress: Goal set today Pt will go Stand to Sit: with supervision PT Goal: Stand to Sit - Progress: Goal set today Pt will Transfer Bed to Chair/Chair to Bed: with supervision PT Transfer Goal: Bed to Chair/Chair to Bed  - Progress: Goal set today Pt will Ambulate: 51 - 150 feet;with supervision;with least restrictive assistive device PT Goal: Ambulate - Progress:  Goal set today Pt will Go Up / Down Stairs: 3-5 stairs;with rail(s);with supervision PT Goal: Up/Down Stairs - Progress: Goal set today  Visit Information  Last PT Received On: 08/01/12 Assistance Needed: +1    Subjective Data  Patient Stated Goal: to go home with wife   Prior Functioning  Home Living Lives With: Spouse Available Help at Discharge: Family;Available 24 hours/day Type of Home: House Home Access: Stairs to enter [employer] of Steps: 3 Entrance Stairs-Rails: Can reach both Home Layout: One level Bathroom Shower/Tub: Walk-in shower;Door [employer] Toilet: Standard Bathroom Accessibility: Yes How Accessible: Accessible via walker Home Adaptive Equipment: Walker - rolling;Wheelchair - manual;Bedside commode/3-in-1;Built-in Water quality scientist Prior Function Level of Independence: Independent Able to Take Stairs?: Reciprically Driving: Yes Vocation: Part time employment Communication Communication: HOH Dominant Hand: Right    Cognition  Overall Cognitive Status: Appears within functional limits for tasks assessed/performed Arousal/Alertness: Awake/alert Orientation Level: Appears intact for tasks assessed Behavior During Session: [facility] for tasks performed    Extremity/Trunk Assessment Right Upper Extremity Assessment RUE ROM/Strength/Tone: Within functional levels RUE Sensation: WFL - Light Touch RUE Coordination: WFL - gross/fine motor Left Upper Extremity Assessment LUE ROM/Strength/Tone: Within functional levels LUE Sensation: WFL - Light Touch LUE Coordination: Deficits LUE Coordination Deficits: exhibited some difficulty with fine motor tasks. unsure if baseline- will continue to assess Right Lower Extremity Assessment RLE ROM/Strength/Tone: Within functional levels RLE Sensation: WFL - Light  Touch RLE Coordination: WFL - gross/fine motor Left Lower Extremity Assessment LLE ROM/Strength/Tone: Within functional levels LLE Sensation: WFL - Light Touch LLE Coordination: WFL - gross/fine motor   Balance Balance Balance Assessed: Yes Static Standing Balance Static Standing - Balance Support: Bilateral upper extremity supported;During functional activity (while using bathroom ) Static Standing - Level of Assistance: 5: Stand by assistance;4: Min assist Static Standing - Comment/# of Minutes: Intermittent min assistance needed during pt standing up to use the bathroom. Pt stood for ~3 minutes holding onto pole on left and IV pole on right.   End of Session PT - End of Session Equipment Utilized During Treatment: Gait belt Activity Tolerance: Patient limited by fatigue Patient left: in bed;with call bell/phone within reach;with nursing in room;with bed alarm set;with family/visitor present Nurse Communication: Mobility status    [name] 08/01/2012, 11:22 AM  08/01/2012 [name] DPT PAGER: [phone] OFFICE: [phone]

## 2012-08-01 NOTE — Evaluation (Signed)
Occupational Therapy Evaluation Patient Details Name: [name] MRN: [id number] DOB: 07-21-1941 Today's Date: 08/01/2012 Time: 4098-1191 OT Time Calculation (min): 28 min  OT Assessment / Plan / Recommendation Clinical Impression  Pt s/p craniotomy for evacuation of a right subacute subdural hematoma. Pt presents with mild LUE motor planning deficit, diplopia, balance deficits and generalized weakness. Pt will benefit from skilled OT in the acute setting to maximize I with ADL and ADL mobility prior to d/c     OT Assessment  Patient needs continued OT Services    Follow Up Recommendations  Home health OT;Supervision/Assistance - 24 hour    Barriers to Discharge      Equipment Recommendations  None recommended by OT    Recommendations for Other Services    Frequency  Min 2X/week    Precautions / Restrictions Precautions Precautions: Fall Precaution Comments: drain from craniotomy Restrictions Weight Bearing Restrictions: No   Pertinent Vitals/Pain Pt reports 8/10 headache; pre-medicated    ADL  Eating/Feeding: Performed;Minimal assistance Where Assessed - Eating/Feeding: Bed level Lower Body Dressing: Simulated;Moderate assistance Where Assessed - Lower Body Dressing: Supported sit to stand Toilet Transfer: Mining engineer Method: Sit to Barista:  (from bed) Toileting - Clothing Manipulation and Hygiene: Simulated;Moderate assistance Where Assessed - Toileting Clothing Manipulation and Hygiene: Sit to stand from 3-in-1 or toilet Equipment Used: Gait belt Transfers/Ambulation Related to ADLs: ambulation NT ADL Comments: Pt HOH    OT Diagnosis: Generalized weakness;Acute pain  OT Problem List: Decreased activity tolerance;Impaired balance (sitting and/or standing);Impaired vision/perception;Decreased coordination;Decreased knowledge of use of DME or AE;Decreased knowledge of precautions;Pain;Impaired UE functional  use OT Treatment Interventions: Self-care/ADL training;DME and/or AE instruction;Therapeutic activities;Visual/perceptual remediation/compensation;Patient/family education;Balance training   OT Goals Acute Rehab OT Goals OT Goal Formulation: With patient Time For Goal Achievement: 08/01/12 Potential to Achieve Goals: Good ADL Goals Pt Will Perform Grooming: with modified independence;Standing at sink;Sitting at sink ADL Goal: Grooming - Progress: Goal set today Pt Will Perform Upper Body Bathing: with supervision;Sit to stand in shower ADL Goal: Upper Body Bathing - Progress: Goal set today Pt Will Perform Lower Body Bathing: with supervision;Sit to stand in shower ADL Goal: Lower Body Bathing - Progress: Goal set today Pt Will Perform Upper Body Dressing: Independently;Sitting, bed;Sitting, chair ADL Goal: Upper Body Dressing - Progress: Goal set today Pt Will Perform Lower Body Dressing: with min assist;Sit to stand from bed;Sit to stand from chair ADL Goal: Lower Body Dressing - Progress: Goal set today Pt Will Transfer to Toilet: with modified independence;Ambulation;3-in-1;with DME ADL Goal: Toilet Transfer - Progress: Goal set today Pt Will Perform Toileting - Clothing Manipulation: Independently;Standing ADL Goal: Toileting - Clothing Manipulation - Progress: Goal set today Pt Will Perform Tub/Shower Transfer: Shower transfer;with min assist;Ambulation;Shower seat with back ADL Goal: Web designer - Progress: Goal set today Additional ADL Goal #1: Pt will participate in further vision testing as well as goal setting. ADL Goal: Additional Goal #1 - Progress: Goal set today  Visit Information  Last OT Received On: 08/01/12 Assistance Needed: +1    Subjective Data  Subjective: You want to get up and move around? Ohhhh, you want ME to get up. I Patient Stated Goal: Return home/ to work   Prior Functioning  Vision/Perception  Home Living Lives With: Spouse Available  Help at Discharge: Family;Available 24 hours/day Type of Home: House Home Access: Stairs to enter [employer] of Steps: 3 Entrance Stairs-Rails: Can reach both Home Layout: One level Bathroom Shower/Tub: Walk-in  shower;Door [employer] Toilet: Standard Bathroom Accessibility: Yes How Accessible: Accessible via walker Home Adaptive Equipment: Walker - rolling;Wheelchair - manual;Bedside commode/3-in-1;Built-in Water quality scientist Prior Function Level of Independence: Independent Able to Take Stairs?: Reciprically Driving: Yes 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 Will conti Praxis Praxis: Impaired Praxis Impairment Details: Motor planning Praxis-Other Comments: appears impaired in LUE- will need to further assess  Cognition  Overall Cognitive Status: Appears within functional limits for tasks assessed/performed Arousal/Alertness: Awake/alert Orientation Level: Appears intact for tasks assessed Behavior During Session: [facility] for tasks performed    Extremity/Trunk Assessment Right Upper Extremity Assessment RUE ROM/Strength/Tone: Within functional levels RUE Sensation: WFL - Light Touch RUE Coordination: WFL - gross/fine motor Left Upper Extremity Assessment LUE ROM/Strength/Tone: Within functional levels LUE Sensation: WFL - Light Touch LUE Coordination: Deficits LUE Coordination Deficits: exhibited some difficulty with fine motor tasks. unsure if baseline- will continue to assess   Mobility Bed Mobility Bed Mobility: Supine to Sit;Sitting - Scoot to Edge of Bed Supine to Sit: 4: Min assist;With rails;HOB elevated Sitting - Scoot to [employer] of Bed: 4: Min assist;With rail Details for Bed Mobility Assistance: Assist to achieve  upright sitting; pt with difficulty scooting to EOB- possibly secondary to pad wet Transfers Transfers: Sit to Stand;Stand to Sit Sit to Stand: 4: Min assist;With upper extremity assist;From bed;From chair/3-in-1 Stand to Sit: 4: Min assist;With armrests;To chair/3-in-1 Details for Transfer Assistance: VC for hand placement. Assist to achieve upright standing. pt attempting to lean against bed in standing- no posterior lean noted once had pt move away from bed   Exercise    Balance    End of Session OT - End of Session Equipment Utilized During Treatment: Gait belt Activity Tolerance: Patient tolerated treatment well Patient left: in chair;with call bell/phone within reach Nurse Communication: Mobility status  GO     Tyannah Sane 08/01/2012, 9:35 AM

## 2012-08-01 NOTE — Progress Notes (Signed)
Pt has refused CPAP due to a large surgical wound on his head.

## 2012-08-01 NOTE — Progress Notes (Signed)
Subjective: Patient reports Is feeling better less headache or nausea no numbness or tingling  Objective: Vital signs in last 24 hours: Temp:  [97.5 F (36.4 C)-98.4 F (36.9 C)] 98.4 F (36.9 C) (08/15 0537) Pulse Rate:  [69-80] 70  (08/15 0537) Resp:  [14-18] 18  (08/15 0537) BP: (111-130)/(61-71) 122/66 mmHg (08/15 0537) SpO2:  [93 %-98 %] 97 % (08/15 0537) Arterial Line BP: (165)/(81) 165/81 mmHg (08/14 1000)  Intake/Output from previous day: 08/14 0701 - 08/15 0700 In: 390 [P.O.:240; I.V.:150] Out: 635 [Urine:635] Intake/Output this shift:    Wound mildly saturated otherwise neurologically nonfocal  Lab Results:  Basename 07/30/12 1230 07/30/12 0400  WBC 6.7 7.2  HGB 10.8* 11.8*  HCT 31.4* 34.5*  PLT 143* 122*   BMET  Basename 07/30/12 1330 07/30/12 0400  NA 143 142  K 3.8 4.2  CL 109 108  CO2 27 27  GLUCOSE 106* 112*  BUN 11 14  CREATININE 0.97 1.06  CALCIUM 8.2* 8.8    Studies/Results: Ct Head Wo Contrast  07/31/2012  *RADIOLOGY REPORT*  Clinical Data: Subdural hematoma.  Craniotomy.  CT HEAD WITHOUT CONTRAST  Technique:  Contiguous axial images were obtained from the base of the skull through the vertex without contrast.  Comparison: 07/30/2012.  Findings: Midline shift has decreased, today measuring 5 mm from right to left.  Postoperative changes of decompression of the right subdural hematoma with surgical drain along the right cerebral convexity.  Mixed attenuation blood products remain in the right subdural space.  Subdural blood layers along the falx.  Right posterior frontal and parietal craniotomy.  Suprasellar cistern appears patent.  There is no herniation identified.  Mastoid air cells appear within normal limits.  IMPRESSION: Postoperative changes of right subdural hematoma drainage with a small amount of mixed blood products remain along the right cerebral convexity with pneumocephalus.  Drain remains in place. Midline shift from right to left has  decreased to 5 mm.  Original Report Authenticated By: Andreas Newport, M.D.    Assessment/Plan: Progressive mobilization with physical therapy today we'll DC his J-P drain later today to followup CT scan in the morning  LOS: 3 days     Ferguson Gertner P 08/01/2012, 9:28 AM

## 2012-08-01 NOTE — Progress Notes (Signed)
[facility] PROGRESS NOTE  [name] ZHY:[id number] DOB: 1941/11/21 DOA: 07/29/2012 PCP: Londell Moh, MD  Assessment/Plan: Principal Problem:  *Subdural hematoma Active Problems:  DIABETES MELLITUS  HYPERTENSION  Atrial fibrillation  CAD (coronary artery disease), native coronary artery  DM (diabetes mellitus)  OSA (obstructive sleep apnea)   1. Subdural hematoma: This is spontaneous, against a background of Coumadin induced coagulopathy. Patient presented with 2-3 weeks of headache, without antecedent trauma. Imaging studies confirmed a large right-sided subacute mixed density subdural hematoma with multiple septations and membranes, 12 mm midline shift and early hydrocephalus. Anticoagulation was reversed with FFP/Vit K, patient was transferred to the ICU, Dr Donalee Citrin provided neurosurgical consultation, and patient underwent craniotomy/evacuation on 07/30/12. Patient is currently undergoing PT/OT. Per neurosurgeon, drain is likely to be discontinued today, and follow up head CT scan done on 08/02/12.  2. Atrial fibrillation: Rate-controlled on Multaq and beta-blocker. Patient will of course, no longer be a candidate for anticoagulation.  3. 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 Controlled on SSI.  4. Hypertension: Blood pressure is well controlled at this time.  5. Coronary artery disease: Stable/Asymptomatic. 6.  Sleep apnea: On CPAP pre-admission. Not able to tolerate, post-op. Otherwise, stable.  7. Constipation: Will order laxatives.    Code Status: Full Code.  Family Communication:  Disposition Plan:  For disachrge, when cleared by neurology.    Brief narrative: 71 year old gentleman with known history of OSA, on nocturnal CPAP, HTN, coronary artery disease and atrial fibrillation on Coumadin who had been having progressive headaches for the past 3 weeks. His INR has been therapeutic between 2.0 and 3.0. He denied any trauma. No any type of injury. No focal neurologic  findings were elicited on initial evaluation, but head CT revealed right-sided subdural hematoma with 12 mm.   Consultants:  Dr Donalee Citrin, neurosurgeon.  Dr Lonia Farber.  Procedures:  Head CT scan.  Craniotomy/Hematoma evacuation.   Antibiotics:  N/A  HPI/Subjective: Feels much better. Constipated, otherwise, no new issues.   Objective: Vital signs in last 24 hours: Temp:  [97.5 F (36.4 C)-98.4 F (36.9 C)] 98.4 F (36.9 C) (08/15 0537) Pulse Rate:  [69-80] 70  (08/15 0537) Resp:  [11-18] 18  (08/15 0537) BP: (111-130)/(61-71) 122/66 mmHg (08/15 0537) SpO2:  [93 %-98 %] 97 % (08/15 0537) Arterial Line BP: (160-165)/(73-81) 165/81 mmHg (08/14 1000) Weight change:     Intake/Output from previous day: 08/14 0701 - 08/15 0700 In: 390 [P.O.:240; I.V.:150] Out: 635 [Urine:635]     Physical Exam: General: Comfortable, alert, communicative, fully oriented, not short of breath at rest. Sitting in chair. HEENT:  Mild clinical pallor, no jaundice, no conjunctival injection or discharge. Hydration status is satisfactory. NECK:  Supple, JVP not seen, no carotid bruits, no palpable lymphadenopathy, no palpable goiter. CHEST:  Clinically clear to auscultation, no wheezes, no crackles. HEART:  Sounds 1 and 2 heard, normal, irregular, no murmurs. ABDOMEN:  Full, soft, non-tender, no palpable organomegaly, no palpable masses, normal bowel sounds. GENITALIA:  Not examined. LOWER EXTREMITIES:  No pitting edema, palpable peripheral pulses. MUSCULOSKELETAL SYSTEM:  Generalized osteoarthritic changes, otherwise, normal. CENTRAL NERVOUS SYSTEM:  No focal neurologic deficit on gross examination.  Lab Results:  Basename 07/30/12 1230 07/30/12 0400  WBC 6.7 7.2  HGB 10.8* 11.8*  HCT 31.4* 34.5*  PLT 143* 122*    Basename 07/30/12 1330 07/30/12 0400  NA 143 142  K 3.8 4.2  CL 109 108  CO2 27 27  GLUCOSE 106* 112*  BUN 11 14  CREATININE 0.97 1.06  CALCIUM 8.2*  8.8   Recent Results (from the past 240 hour(s))  MRSA PCR SCREENING     Status: Normal   Collection Time   07/30/12  2:25 AM      Component Value Range Status Comment   MRSA by PCR NEGATIVE  NEGATIVE Final      Studies/Results: Ct Head Wo Contrast  07/31/2012  *RADIOLOGY REPORT*  Clinical Data: Subdural hematoma.  Craniotomy.  CT HEAD WITHOUT CONTRAST  Technique:  Contiguous axial images were obtained from the base of the skull through the vertex without contrast.  Comparison: 07/30/2012.  Findings: Midline shift has decreased, today measuring 5 mm from right to left.  Postoperative changes of decompression of the right subdural hematoma with surgical drain along the right cerebral convexity.  Mixed attenuation blood products remain in the right subdural space.  Subdural blood layers along the falx.  Right posterior frontal and parietal craniotomy.  Suprasellar cistern appears patent.  There is no herniation identified.  Mastoid air cells appear within normal limits.  IMPRESSION: Postoperative changes of right subdural hematoma drainage with a small amount of mixed blood products remain along the right cerebral convexity with pneumocephalus.  Drain remains in place. Midline shift from right to left has decreased to 5 mm.  Original Report Authenticated By: Andreas Newport, M.D.    Medications: Scheduled Meds:   . atorvastatin  10 mg Oral q1800  . docusate sodium  100 mg Oral BID  . dronedarone  400 mg Oral BID WC  . furosemide  40 mg Oral Daily  . levetiracetam  500 mg Intravenous Q12H  . metoprolol  50 mg Oral BID  . potassium chloride SA  20 mEq Oral Daily  . sertraline  100 mg Oral Daily  . sodium chloride  3 mL Intravenous Q12H  . Tamsulosin HCl  0.4 mg Oral QHS  . DISCONTD: insulin aspart  0-15 Units Subcutaneous TID WC  . DISCONTD: pantoprazole (PROTONIX) IV  40 mg Intravenous QHS  . DISCONTD: sodium chloride  3 mL Intravenous Q12H   Continuous Infusions:   . DISCONTD: sodium  chloride 100 mL/hr at 07/30/12 0256  . DISCONTD: 0.9 % NaCl with KCl 20 mEq / L 75 mL/hr at 07/31/12 0900   PRN Meds:.sodium chloride, acetaminophen, acetaminophen, HYDROcodone-acetaminophen, HYDROmorphone (DILAUDID) injection, labetalol, morphine injection, ondansetron (ZOFRAN) IV, ondansetron (ZOFRAN) IV, ondansetron, ondansetron, promethazine, sodium chloride, DISCONTD: sodium chloride    LOS: 3 days   Calena Salem,CHRISTOPHER  [facility] Pager [phone]. If 8PM-8AM, please contact night-coverage at [email or web address], password [facility] 08/01/2012, 8:06 AM  LOS: 3 days

## 2012-08-02 ENCOUNTER — Inpatient Hospital Stay (HOSPITAL_COMMUNITY): Payer: Medicare Other

## 2012-08-02 DIAGNOSIS — I10 Essential (primary) hypertension: Secondary | ICD-10-CM

## 2012-08-02 DIAGNOSIS — R51 Headache: Secondary | ICD-10-CM | POA: Diagnosis not present

## 2012-08-02 DIAGNOSIS — I6200 Nontraumatic subdural hemorrhage, unspecified: Secondary | ICD-10-CM | POA: Diagnosis not present

## 2012-08-02 DIAGNOSIS — I4891 Unspecified atrial fibrillation: Secondary | ICD-10-CM | POA: Diagnosis not present

## 2012-08-02 DIAGNOSIS — E119 Type 2 diabetes mellitus without complications: Secondary | ICD-10-CM | POA: Diagnosis not present

## 2012-08-02 LAB — CBC
HCT: 30.7 % — ABNORMAL LOW (ref 39.0–52.0)
Hemoglobin: 10.4 g/dL — ABNORMAL LOW (ref 13.0–17.0)
MCH: 30.2 pg (ref 26.0–34.0)
MCHC: 33.9 g/dL (ref 30.0–36.0)
MCV: 89.2 fL (ref 78.0–100.0)
Platelets: 134 10*3/uL — ABNORMAL LOW (ref 150–400)
RBC: 3.44 MIL/uL — ABNORMAL LOW (ref 4.22–5.81)
RDW: 14.3 % (ref 11.5–15.5)
WBC: 7.4 10*3/uL (ref 4.0–10.5)

## 2012-08-02 LAB — BASIC METABOLIC PANEL
BUN: 18 mg/dL (ref 6–23)
CO2: 27 mEq/L (ref 19–32)
Calcium: 9 mg/dL (ref 8.4–10.5)
Chloride: 102 mEq/L (ref 96–112)
Creatinine, Ser: 0.92 mg/dL (ref 0.50–1.35)
GFR calc Af Amer: >90 mL/min (ref >90)
GFR calc non Af Amer: 84 mL/min — ABNORMAL LOW (ref >90)
Glucose, Bld: 93 mg/dL (ref 70–99)
Potassium: 3.8 mEq/L (ref 3.5–5.1)
Sodium: 138 mEq/L (ref 135–145)

## 2012-08-02 MED ORDER — LEVETIRACETAM 500 MG PO TABS: 500.0000 mg | ORAL_TABLET | Freq: Two times a day (BID) | ORAL | Status: DC

## 2012-08-02 MED ORDER — POLYETHYLENE GLYCOL 3350 17 G PO PACK: 17.0000 g | PACK | Freq: Every day | ORAL | Status: AC

## 2012-08-02 MED ORDER — DSS 100 MG PO CAPS: 100.0000 mg | ORAL_CAPSULE | Freq: Two times a day (BID) | ORAL | Status: AC

## 2012-08-02 MED ORDER — HYDROCODONE-ACETAMINOPHEN 5-325 MG PO TABS: 1.0000 | ORAL_TABLET | ORAL | Status: AC | PRN

## 2012-08-02 NOTE — Progress Notes (Signed)
Patient ID: [name], male   DOB: November 28, 1941, 71 y.o.   MRN: [id number] Patient is doing fairly well so significant headache but no other complaints of vision numbness tingling weakness in his arms or his legs. Neurologically he is awake alert oriented x4 strength out of 5 no pronator drift his incision is clean and dry. His followup CAT scan shows significant proven of his right left midline shift and normal postoperative fluid and the flap with minimal residual subdural.

## 2012-08-02 NOTE — Care Management Note (Signed)
    Page 1 of 1   08/02/2012     1:54:27 PM   CARE MANAGEMENT NOTE 08/02/2012  Patient:  [name], [name]   Account Number:  [id number]  Date Initiated:  07/30/2012  Documentation initiated by:  Carlyle Lipa  Subjective/Objective Assessment:   SDH in pt with Coumadin for afib-nontraumatic     Action/Plan:   home when medically stable   Anticipated DC Date:  08/03/2012   Anticipated DC Plan:  HOME/SELF CARE      DC Planning Services  CM consult      Choice offered to / List presented to:  C-1 Patient        HH arranged  HH-2 PT  HH-3 OT      HH agency  [facility]   Status of service:  In process, will continue to follow Medicare Important Message given?   (If response is "NO", the following Medicare IM given date fields will be blank) Date Medicare IM given:   Date Additional Medicare IM given:    Discharge Disposition:    Per UR Regulation:  Reviewed for med. necessity/level of care/duration of stay  If discussed at Long Length of Stay Meetings, dates discussed:    Comments:  08/02/12 Onnie Boer, RN, BSN 1352 POSTOP DAY 3.  PT HAS BEEN ARRNAGED WITH [facility] FOR HH PT/OT. PT HAS BEEN USING A RW, HOWEVER THERAPIES HAVE NOT RECOMMENDED IT.  WILL F/U.

## 2012-08-02 NOTE — Progress Notes (Signed)
[facility] PROGRESS NOTE  ALEXXANDER KURT ZOX:[id number] DOB: 17-Jan-1941 DOA: 07/29/2012 PCP: Londell Moh, MD  Assessment/Plan: Principal Problem:  *Subdural hematoma Active Problems:  DIABETES MELLITUS  HYPERTENSION  Atrial fibrillation  CAD (coronary artery disease), native coronary artery  DM (diabetes mellitus)  OSA (obstructive sleep apnea)   1. Subdural hematoma: This is spontaneous, against a background of Coumadin induced coagulopathy. Patient presented with 2-3 weeks of headache, without antecedent trauma. Imaging studies confirmed a large right-sided subacute mixed density subdural hematoma with multiple septations and membranes, 12 mm midline shift and early hydrocephalus. Anticoagulation was reversed with FFP/Vit K, patient was transferred to the ICU, Dr Donalee Citrin provided neurosurgical consultation, and patient underwent craniotomy/evacuation on 07/30/12. Patient is currently undergoing PT/OT. Drainage tube was discontinued on 08/01/12, and follow up head CT scan done on 08/02/12, showed no evidence of reaccumulation. Fortunately, patient has no obvious focal neurology.  2. Atrial fibrillation: Rate-controlled on Multaq and beta-blocker. Patient will of course, no longer be a candidate for anticoagulation.  3. 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 Controlled on SSI and diet only, during this hospitalization. Likely diet-controlled. Patient was not on diabetic medication, pre-admission.  4. Hypertension: Blood pressure is well controlled at this time.  5. Coronary artery disease: Stable/Asymptomatic. 6.  Sleep apnea: On CPAP pre-admission. Was not able to tolerate, post-op. Otherwise, stable.  7. Constipation: Responded to laxative.     Code Status: Full Code.  Family Communication:  Disposition Plan:  For disachrge, when cleared by neurosurgeon.    Brief narrative: 71 year old gentleman with known history of OSA, on nocturnal CPAP, HTN, coronary artery disease and atrial  fibrillation on Coumadin who had been having progressive headaches for the past 3 weeks. His INR has been therapeutic between 2.0 and 3.0. He denied any trauma. No any type of injury. No focal neurologic findings were elicited on initial evaluation, but head CT revealed right-sided subdural hematoma with 12 mm.   Consultants:  Dr Donalee Citrin, neurosurgeon.  Dr Lonia Farber.  Procedures:  Head CT scan.  Craniotomy/Hematoma evacuation.   Antibiotics:  N/A  HPI/Subjective: No new issues.   Objective: Vital signs in last 24 hours: Temp:  [97.9 F (36.6 C)-98.3 F (36.8 C)] 97.9 F (36.6 C) (08/16 1000) Pulse Rate:  [51-93] 62  (08/16 1056) Resp:  [16-20] 17  (08/16 1000) BP: (111-141)/(57-85) 111/57 mmHg (08/16 1000) SpO2:  [94 %-100 %] 100 % (08/16 1000) Weight change:  Last BM Date:  (pts)  Intake/Output from previous day: 08/15 0701 - 08/16 0700 In: 240 [P.O.:240] Out: 600 [Urine:600]     Physical Exam: General: Comfortable, alert, communicative, fully oriented, not short of breath at rest. Sitting in chair. HEENT:  Mild clinical pallor, no jaundice, no conjunctival injection or discharge. Hydration status is satisfactory. NECK:  Supple, JVP not seen, no carotid bruits, no palpable lymphadenopathy, no palpable goiter. CHEST:  Clinically clear to auscultation, no wheezes, no crackles. HEART:  Sounds 1 and 2 heard, normal, irregular, no murmurs. ABDOMEN:  Full, soft, non-tender, no palpable organomegaly, no palpable masses, normal bowel sounds. GENITALIA:  Not examined. LOWER EXTREMITIES:  No pitting edema, palpable peripheral pulses. MUSCULOSKELETAL SYSTEM:  Generalized osteoarthritic changes, otherwise, normal. CENTRAL NERVOUS SYSTEM:  No focal neurologic deficit on gross examination.  Lab Results:  Basename 08/02/12 0655 07/30/12 1230  WBC 7.4 6.7  HGB 10.4* 10.8*  HCT 30.7* 31.4*  PLT 134* 143*    Basename 08/02/12 0655 07/30/12 1330  NA 138  143  K 3.8 3.8  CL 102 109  CO2 27 27  GLUCOSE 93 106*  BUN 18 11  CREATININE 0.92 0.97  CALCIUM 9.0 8.2*   Recent Results (from the past 240 hour(s))  MRSA PCR SCREENING     Status: Normal   Collection Time   07/30/12  2:25 AM      Component Value Range Status Comment   MRSA by PCR NEGATIVE  NEGATIVE Final      Studies/Results: Ct Head Wo Contrast  08/02/2012  *RADIOLOGY REPORT*  Clinical Data: Headache.  Follow-up subdural hematoma.  CT HEAD WITHOUT CONTRAST  Technique:  Contiguous axial images were obtained from the base of the skull through the vertex without contrast.  Comparison: 07/31/2012.  Findings: The patient had difficulty remaining motionless for the study.  Images are suboptimal.  Small or subtle lesions could be overlooked.  Redemonstrated are postoperative changes of right subdural hematoma evacuation.  Surgical drain has been removed.   Residual extra- axial blood products along with pneumocephalus persists, not significantly changed. This measures maximum 15 mm thickness on image 23.  3 mm right-to-left shift as measured on image 18 is improved.  IMPRESSION: Subdural drain removal.  No significant reaccumulation.  Slight residual extra-axial fluid.  Improved right-to-left shift.  Original Report Authenticated By: Elsie Stain, M.D.    Medications: Scheduled Meds:    . atorvastatin  10 mg Oral q1800  . docusate sodium  100 mg Oral BID  . dronedarone  400 mg Oral BID WC  . furosemide  40 mg Oral Daily  . levetiracetam  500 mg Intravenous Q12H  . metoprolol  50 mg Oral BID  . polyethylene glycol  17 g Oral Daily  . potassium chloride SA  20 mEq Oral Daily  . sertraline  100 mg Oral Daily  . sodium chloride  3 mL Intravenous Q12H  . Tamsulosin HCl  0.4 mg Oral QHS   Continuous Infusions:  PRN Meds:.sodium chloride, acetaminophen, acetaminophen, HYDROcodone-acetaminophen, HYDROmorphone (DILAUDID) injection, labetalol, morphine injection, ondansetron (ZOFRAN) IV,  ondansetron (ZOFRAN) IV, ondansetron, ondansetron, promethazine, sodium chloride    LOS: 4 days   Nessa Ramaker,CHRISTOPHER  [facility] Pager [phone]. If 8PM-8AM, please contact night-coverage at [email or web address], password [facility] 08/02/2012, 11:21 AM  LOS: 4 days

## 2012-08-02 NOTE — Discharge Summary (Signed)
Physician Discharge Summary  [name] WJX:[id number] DOB: 14-Mar-1941 DOA: 07/29/2012  PCP: Londell Moh, MD  Admit date: 07/29/2012 Discharge date: 08/02/2012  Recommendations for Outpatient Follow-up:  1. Follow up with Dr Donalee Citrin, neurosurgeon. 2. Follow up with PMD.  Discharge Diagnoses:  Principal Problem:  *Subdural hematoma Active Problems:  DIABETES MELLITUS  HYPERTENSION  Atrial fibrillation  CAD (coronary artery disease), native coronary artery  DM (diabetes mellitus)  OSA (obstructive sleep apnea)   Discharge Condition: Satisfactory.  Diet recommendation: Heart-Healthy/Carbohydrate-modified.   Filed Weights   07/30/12 0300 07/31/12 0500  Weight: 100.3 kg (221 lb 1.9 oz) 102.8 kg (226 lb 10.1 oz)    History of present illness:  71 year old gentleman with known history of OSA, on nocturnal CPAP, HTN, coronary artery disease and atrial fibrillation on Coumadin who had been having progressive headaches for the past 3 weeks. His INR has been therapeutic between 2.0 and 3.0. He denied any trauma. No any type of injury. No focal neurologic findings were elicited on initial evaluation, but head CT revealed right-sided subdural hematoma with 12 mm.    Hospital Course:  1. Subdural hematoma: This is spontaneous, against a background of Coumadin induced coagulopathy. Patient presented with 2-3 weeks of headache, without antecedent trauma. Imaging studies confirmed a large right-sided subacute mixed density subdural hematoma with multiple septations and membranes, 12 mm midline shift and early hydrocephalus. Anticoagulation was reversed with FFP/Vit K, patient was transferred to the ICU, Dr Donalee Citrin provided neurosurgical consultation, and patient underwent craniotomy/evacuation on 07/30/12. Patient is currently undergoing PT/OT. Drainage tube was discontinued on 08/01/12, and follow up head CT scan done on 08/02/12, showed no evidence of reaccumulation. Fortunately,  patient has no obvious focal neurology. He was cleared for discharge on 08/02/12, by Dr Wynetta Emery. 2. Atrial fibrillation: Rate-controlled on Multaq and beta-blocker. Patient will of course, no longer be a candidate for anticoagulation.  3. 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 Controlled on SSI and diet only, during this hospitalization. Likely diet-controlled. Patient was not on diabetic medication, pre-admission.  4. Hypertension: Blood pressure is well controlled at this time.  5. Coronary artery disease: Stable/Asymptomatic.  6. Sleep apnea: On CPAP pre-admission. Was not able to tolerate, post-op. Otherwise, stable.  7. Constipation: Responded to laxative.    Procedures:  See below.  Consultations: Dr Donalee Citrin, neurosurgeon.  Dr Lonia Farber.  Discharge Exam: Filed Vitals:   08/02/12 1056  BP:   Pulse: 62  Temp:   Resp:    Filed Vitals:   08/02/12 0211 08/02/12 0700 08/02/12 1000 08/02/12 1056  BP: 125/85 141/74 111/57   Pulse: 70 70 51 62  Temp: 98.1 F (36.7 C) 97.9 F (36.6 C) 97.9 F (36.6 C)   TempSrc: Oral Oral Oral   Resp: 20 20 17    Height:      Weight:      SpO2: 98% 94% 100%     General: Comfortable, alert, communicative, fully oriented, not short of breath at rest. Sitting in chair.  HEENT: Mild clinical pallor, no jaundice, no conjunctival injection or discharge. Hydration status is satisfactory. Patient has dry dressings over craniotomy wound.  NECK: Supple, JVP not seen, no carotid bruits, no palpable lymphadenopathy, no palpable goiter.  CHEST: Clinically clear to auscultation, no wheezes, no crackles.  HEART: Sounds 1 and 2 heard, normal, irregular, no murmurs.  ABDOMEN: Full, soft, non-tender, no palpable organomegaly, no palpable masses, normal bowel sounds.  GENITALIA: Not examined.  LOWER EXTREMITIES: No pitting edema, palpable peripheral  pulses.  MUSCULOSKELETAL SYSTEM: Generalized osteoarthritic changes, otherwise, normal.  CENTRAL NERVOUS  SYSTEM: No focal neurologic deficit on gross examination.  Discharge Instructions  Discharge Orders    Future Orders Please Complete By Expires   Diet - low sodium heart healthy      Increase activity slowly      Change dressing (specify)      Comments:   Dressing change as instructed.     Medication List  As of 08/02/2012  2:20 PM   STOP taking these medications         warfarin 5 MG tablet         TAKE these medications         dronedarone 400 MG tablet   Commonly known as: MULTAQ   Take 1 tablet (400 mg total) by mouth 2 (two) times daily with a meal.      DSS 100 MG Caps   Take 100 mg by mouth 2 (two) times daily.      FLOMAX 0.4 MG Caps   Generic drug: Tamsulosin HCl   Take by mouth at bedtime.      furosemide 40 MG tablet   Commonly known as: LASIX   Take 40 mg by mouth. Take 3-4 times per week, as needed. For fluid      HYDROcodone-acetaminophen 5-325 MG per tablet   Commonly known as: NORCO/VICODIN   Take 1 tablet by mouth every 4 (four) hours as needed.      levETIRAcetam 500 MG tablet   Commonly known as: KEPPRA   Take 1 tablet (500 mg total) by mouth 2 (two) times daily.      metoprolol 50 MG tablet   Commonly known as: LOPRESSOR   Take 1 tablet (50 mg total) by mouth 2 (two) times daily.      omega-3 acid ethyl esters 1 G capsule   Commonly known as: LOVAZA   Take 2 g by mouth 3 (three) times daily.      polyethylene glycol packet   Commonly known as: MIRALAX / GLYCOLAX   Take 17 g by mouth daily.      potassium chloride SA 20 MEQ tablet   Commonly known as: K-DUR,KLOR-CON   Take 20 mEq by mouth daily.      rosuvastatin 10 MG tablet   Commonly known as: CRESTOR   Take 10 mg by mouth daily.      sertraline 100 MG tablet   Commonly known as: ZOLOFT   Take 100 mg by mouth daily.           Follow-up Information    Follow up with Londell Moh, MD. (As needed)    Contact information:   [address] [place] [address] [phone]       Follow up with [facility], MD in 1 week. (Please call to confirm appointment.)    Contact information:   [address] [place] [address] [phone]           The results of significant diagnostics from this hospitalization (including imaging, microbiology, ancillary and laboratory) are listed below for reference.    Significant Diagnostic Studies: Ct Head Wo Contrast  08/02/2012  *RADIOLOGY REPORT*  Clinical Data: Headache.  Follow-up subdural hematoma.  CT HEAD WITHOUT CONTRAST  Technique:  Contiguous axial images were obtained from the base of the skull through the vertex without contrast.  Comparison: 07/31/2012.  Findings: The patient had difficulty remaining motionless for  the study.  Images are suboptimal.  Small or subtle lesions could be overlooked.  Redemonstrated are postoperative changes of right subdural hematoma evacuation.  Surgical drain has been removed.   Residual extra- axial blood products along with pneumocephalus persists, not significantly changed. This measures maximum 15 mm thickness on image 23.  3 mm right-to-left shift as measured on image 18 is improved.  IMPRESSION: Subdural drain removal.  No significant reaccumulation.  Slight residual extra-axial fluid.  Improved right-to-left shift.  Original Report Authenticated By: Elsie Stain, M.D.   Ct Head Wo Contrast  07/31/2012  *RADIOLOGY REPORT*  Clinical Data: Subdural hematoma.  Craniotomy.  CT HEAD WITHOUT CONTRAST  Technique:  Contiguous axial images were obtained from the base of the skull through the vertex without contrast.  Comparison: 07/30/2012.  Findings: Midline shift has decreased, today measuring 5 mm from right to left.  Postoperative changes of decompression of the right subdural hematoma with surgical drain along the right cerebral convexity.  Mixed attenuation blood products remain in the right subdural space.   Subdural blood layers along the falx.  Right posterior frontal and parietal craniotomy.  Suprasellar cistern appears patent.  There is no herniation identified.  Mastoid air cells appear within normal limits.  IMPRESSION: Postoperative changes of right subdural hematoma drainage with a small amount of mixed blood products remain along the right cerebral convexity with pneumocephalus.  Drain remains in place. Midline shift from right to left has decreased to 5 mm.  Original Report Authenticated By: Andreas Newport, M.D.   Ct Head Wo Contrast  07/30/2012  *RADIOLOGY REPORT*  Clinical Data: Headache  CT HEAD WITHOUT CONTRAST  Technique:  Contiguous axial images were obtained from the base of the skull through the vertex without contrast.  Comparison: None.  Findings: There is a large mixed attenuation right subdural hematoma spanning from the frontal region and anterior falx through the temporal and parietal regions, measuring up to 2 cm  in thickness, and exerting significant mass effect on the right cerebral hemisphere, resulting in subfalcine herniation. There is up to 12 mm midline shift from right to left.  No left-sided or intraventricular hemorrhage is evident.  No focal parenchymal hypoattenuation.  Acute infarct may be inapparent on noncontrast CT.  Bone windows show no calvarial lesion.  There is compression of the right lateral ventricle and some dilatation of the left lateral ventricle.  IMPRESSION:  1.  Large right mixed attenuation subdural hematoma resulting in 12 mm midline shift, subfalcine herniation, and early hydrocephalus. I telephoned the critical test results to Dr. Fonnie Jarvis at the time of interpretation.  Original Report Authenticated By: Osa Craver, M.D.    Microbiology: Recent Results (from the past 240 hour(s))  MRSA PCR SCREENING     Status: Normal   Collection Time   07/30/12  2:25 AM      Component Value Range Status Comment   MRSA by PCR NEGATIVE  NEGATIVE Final       Labs: Basic Metabolic Panel:  Lab 08/02/12 9604 07/30/12 1330 07/30/12 0400 07/29/12 2326  NA 138 143 142 143  K 3.8 3.8 4.2 4.3  CL 102 109 108 107  CO2 27 27 27 28   GLUCOSE 93 106* 112* 116*  BUN 18 11 14 15   CREATININE 0.92 0.97 1.06 1.15  CALCIUM 9.0 8.2* 8.8 9.4  MG -- -- -- --  PHOS -- -- -- --   Liver Function Tests:  Lab 07/30/12 0400 07/29/12 2326  AST 25  26  ALT 17 18  ALKPHOS 67 70  BILITOT 0.6 0.6  PROT 7.1 7.7  ALBUMIN 3.7 4.0   No results found for this basename: LIPASE:5,AMYLASE:5 in the last 168 hours No results found for this basename: AMMONIA:5 in the last 168 hours CBC:  Lab 08/02/12 0655 07/30/12 1230 07/30/12 0400 07/29/12 2326  WBC 7.4 6.7 7.2 7.9  NEUTROABS -- -- -- --  HGB 10.4* 10.8* 11.8* 12.1*  HCT 30.7* 31.4* 34.5* 35.4*  MCV 89.2 89.0 89.4 89.4  PLT 134* 143* 122* 126*   Cardiac Enzymes: No results found for this basename: CKTOTAL:5,CKMB:5,CKMBINDEX:5,TROPONINI:5 in the last 168 hours BNP: BNP (last 3 results) No results found for this basename: PROBNP:3 in the last 8760 hours CBG:  Lab 07/31/12 1232 07/31/12 0926 07/30/12 2158 07/30/12 1701 07/30/12 1213  GLUCAP 111* 119* 102* 91 101*    Time coordinating discharge: 35 minutes  Signed:  Sebastion Jun,CHRISTOPHER  [facility] 08/02/2012, 2:20 PM

## 2012-08-02 NOTE — Progress Notes (Signed)
Occupational Therapy Treatment Patient Details Name: ZAID TOMES MRN: [id number] DOB: [birth year] Today's Date: 08/02/2012 Time: 4098-1191 OT Time Calculation (min): 37 min  OT Assessment / Plan / Recommendation Comments on Treatment Session Pt progressing well with therapy. Next session to focus on LB ADLs and shower transfer    Follow Up Recommendations  Home health OT;Supervision/Assistance - 24 hour    Barriers to Discharge       Equipment Recommendations  None recommended by PT;None recommended by OT    Recommendations for Other Services    Frequency Min 2X/week   Plan Discharge plan remains appropriate    Precautions / Restrictions Precautions Precautions: Fall Restrictions Weight Bearing Restrictions: No   Pertinent Vitals/Pain Pt reports headache (did not rate) but reports relief in standing. Pre-medicated    ADL  Grooming: Performed;Teeth care;Wash/dry hands;Min guard Where Assessed - Grooming: Supported standing (bracing self on sink and LUE supported) Upper Body Dressing: Performed;Min guard Where Assessed - Upper Body Dressing: Supported standing Lower Body Dressing: Performed;Minimal assistance (don socks) Where Assessed - Lower Body Dressing: Unsupported sitting Toilet Transfer: Min Musician Method: Sit to Barista:  (from bed with bil hands on bed) Equipment Used: Gait belt Transfers/Ambulation Related to ADLs: pt min guard A ambulation with RW in room and into hallway ADL Comments: Pt with slow, purposeful fine motor movements. Motor planning with much improvement today. No c/o double vision    OT Diagnosis:    OT Problem List:   OT Treatment Interventions:     OT Goals ADL Goals ADL Goal: Grooming - Progress: Progressing toward goals ADL Goal: Upper Body Dressing - Progress: Progressing toward goals ADL Goal: Lower Body Dressing - Progress: Progressing toward goals ADL Goal: Toilet Transfer -  Progress: Progressing toward goals ADL Goal: Additional Goal #1 - Progress: Met  Visit Information  Last OT Received On: 08/02/12 Assistance Needed: +1    Subjective Data      Prior Functioning       Cognition  Overall Cognitive Status: Appears within functional limits for tasks assessed/performed Arousal/Alertness: Awake/alert Orientation Level: Appears intact for tasks assessed Behavior During Session: [facility] for tasks performed    Mobility Bed Mobility Supine to Sit: 4: Min guard;HOB elevated;With rails Sitting - Scoot to Edge of Bed: 5: Supervision;With rail Transfers Sit to Stand: 4: Min guard;From bed;With upper extremity assist Stand to Sit: 4: Min guard;With armrests;To chair/3-in-1 Details for Transfer Assistance: VC for hand placement and to control descent into chair.    Exercises    Balance    End of Session OT - End of Session Equipment Utilized During Treatment: Gait belt Activity Tolerance: Patient tolerated treatment well Patient left: in chair;with call bell/phone within reach;with family/visitor present Nurse Communication: Mobility status  GO     Kaisei Gilbo 08/02/2012, 9:36 AM

## 2012-08-02 NOTE — Progress Notes (Signed)
Physical Therapy Treatment Patient Details Name: [name] MRN: [id number] DOB: 1941-02-03 Today's Date: 08/02/2012 Time: 4098-1191 PT Time Calculation (min): 35 min  PT Assessment / Plan / Recommendation Comments on Treatment Session  Pt with great improvements with stability as well as motor control this session. Pt with improvements in safety with RW. Will continue per plan prior to d/c    Follow Up Recommendations  Home health PT;Supervision for mobility/OOB       Equipment Recommendations  None recommended by PT;None recommended by OT       Frequency Min 4X/week   Plan Discharge plan remains appropriate;Frequency remains appropriate    Precautions / Restrictions Precautions Precautions: Fall Restrictions Weight Bearing Restrictions: No       Mobility  Bed Mobility Bed Mobility: Supine to Sit;Sitting - Scoot to Edge of Bed Supine to Sit: 4: Min guard;HOB elevated;With rails Sitting - Scoot to Edge of Bed: 5: Supervision;With rail Details for Bed Mobility Assistance: VC for sequencing Transfers Transfers: Stand to Sit;Sit to Stand Sit to Stand: 4: Min guard;From bed;With upper extremity assist Stand to Sit: 4: Min guard;With armrests;To chair/3-in-1 Details for Transfer Assistance: VC for hand placement and to control descent into chair.  Ambulation/Gait Ambulation/Gait Assistance: 4: Min guard Ambulation Distance (Feet): 200 Feet Assistive device: Rolling walker Ambulation/Gait Assistance Details: VC for proper sequencing and distance with RW. Vc throughout for increased step length as pt with increased shuffling with fatigue. Gait Pattern: Step-to pattern;Decreased hip/knee flexion - right;Decreased hip/knee flexion - left;Shuffle;Narrow base of support;Decreased stride length Gait velocity: decreased     PT Goals Acute Rehab PT Goals PT Goal: Supine/Side to Sit - Progress: Progressing toward goal PT Goal: Sit to Supine/Side - Progress: Progressing toward  goal PT Goal: Sit to Stand - Progress: Progressing toward goal PT Goal: Stand to Sit - Progress: Progressing toward goal PT Transfer Goal: Bed to Chair/Chair to Bed - Progress: Progressing toward goal PT Goal: Ambulate - Progress: Progressing toward goal  Visit Information  Last PT Received On: 08/02/12 Assistance Needed: +1    Subjective Data      Cognition  Overall Cognitive Status: Appears within functional limits for tasks assessed/performed Arousal/Alertness: Awake/alert Orientation Level: Appears intact for tasks assessed Behavior During Session: [facility] for tasks performed    Balance     End of Session PT - End of Session Equipment Utilized During Treatment: Gait belt Activity Tolerance: Patient tolerated treatment well Patient left: with call bell/phone within reach;in chair;with family/visitor present Nurse Communication: Mobility status     [name] 08/02/2012, 10:40 AM  08/02/2012 [name] DPT PAGER: [phone] OFFICE: [phone]

## 2012-08-07 ENCOUNTER — Ambulatory Visit: Payer: Self-pay | Admitting: Cardiology

## 2012-08-07 DIAGNOSIS — Z7901 Long term (current) use of anticoagulants: Secondary | ICD-10-CM

## 2012-08-07 DIAGNOSIS — R269 Unspecified abnormalities of gait and mobility: Secondary | ICD-10-CM | POA: Diagnosis not present

## 2012-08-07 DIAGNOSIS — E119 Type 2 diabetes mellitus without complications: Secondary | ICD-10-CM | POA: Diagnosis not present

## 2012-08-07 DIAGNOSIS — I4891 Unspecified atrial fibrillation: Secondary | ICD-10-CM

## 2012-08-07 DIAGNOSIS — Z48812 Encounter for surgical aftercare following surgery on the circulatory system: Secondary | ICD-10-CM | POA: Diagnosis not present

## 2012-08-07 DIAGNOSIS — G4733 Obstructive sleep apnea (adult) (pediatric): Secondary | ICD-10-CM | POA: Diagnosis not present

## 2012-08-07 DIAGNOSIS — IMO0001 Reserved for inherently not codable concepts without codable children: Secondary | ICD-10-CM | POA: Diagnosis not present

## 2012-08-08 DIAGNOSIS — G4733 Obstructive sleep apnea (adult) (pediatric): Secondary | ICD-10-CM | POA: Diagnosis not present

## 2012-08-08 DIAGNOSIS — R269 Unspecified abnormalities of gait and mobility: Secondary | ICD-10-CM | POA: Diagnosis not present

## 2012-08-08 DIAGNOSIS — Z48812 Encounter for surgical aftercare following surgery on the circulatory system: Secondary | ICD-10-CM | POA: Diagnosis not present

## 2012-08-08 DIAGNOSIS — E119 Type 2 diabetes mellitus without complications: Secondary | ICD-10-CM | POA: Diagnosis not present

## 2012-08-08 DIAGNOSIS — I4891 Unspecified atrial fibrillation: Secondary | ICD-10-CM | POA: Diagnosis not present

## 2012-08-08 DIAGNOSIS — IMO0001 Reserved for inherently not codable concepts without codable children: Secondary | ICD-10-CM | POA: Diagnosis not present

## 2012-08-09 ENCOUNTER — Other Ambulatory Visit: Payer: Self-pay | Admitting: Neurosurgery

## 2012-08-09 DIAGNOSIS — I609 Nontraumatic subarachnoid hemorrhage, unspecified: Secondary | ICD-10-CM

## 2012-08-12 DIAGNOSIS — IMO0001 Reserved for inherently not codable concepts without codable children: Secondary | ICD-10-CM | POA: Diagnosis not present

## 2012-08-12 DIAGNOSIS — I4891 Unspecified atrial fibrillation: Secondary | ICD-10-CM | POA: Diagnosis not present

## 2012-08-12 DIAGNOSIS — R269 Unspecified abnormalities of gait and mobility: Secondary | ICD-10-CM | POA: Diagnosis not present

## 2012-08-12 DIAGNOSIS — E119 Type 2 diabetes mellitus without complications: Secondary | ICD-10-CM | POA: Diagnosis not present

## 2012-08-12 DIAGNOSIS — G4733 Obstructive sleep apnea (adult) (pediatric): Secondary | ICD-10-CM | POA: Diagnosis not present

## 2012-08-12 DIAGNOSIS — Z48812 Encounter for surgical aftercare following surgery on the circulatory system: Secondary | ICD-10-CM | POA: Diagnosis not present

## 2012-08-14 ENCOUNTER — Ambulatory Visit
Admission: RE | Admit: 2012-08-14 | Discharge: 2012-08-14 | Disposition: A | Discharge status: Home / Self Care | Payer: Medicare Other | Source: Ambulatory Visit | Attending: Neurosurgery | Admitting: Neurosurgery

## 2012-08-14 DIAGNOSIS — IMO0001 Reserved for inherently not codable concepts without codable children: Secondary | ICD-10-CM | POA: Diagnosis not present

## 2012-08-14 DIAGNOSIS — D649 Anemia, unspecified: Secondary | ICD-10-CM | POA: Diagnosis not present

## 2012-08-14 DIAGNOSIS — I4891 Unspecified atrial fibrillation: Secondary | ICD-10-CM | POA: Diagnosis not present

## 2012-08-14 DIAGNOSIS — G4733 Obstructive sleep apnea (adult) (pediatric): Secondary | ICD-10-CM | POA: Diagnosis not present

## 2012-08-14 DIAGNOSIS — I10 Essential (primary) hypertension: Secondary | ICD-10-CM | POA: Diagnosis not present

## 2012-08-14 DIAGNOSIS — I6200 Nontraumatic subdural hemorrhage, unspecified: Secondary | ICD-10-CM | POA: Diagnosis not present

## 2012-08-14 DIAGNOSIS — Z48812 Encounter for surgical aftercare following surgery on the circulatory system: Secondary | ICD-10-CM | POA: Diagnosis not present

## 2012-08-14 DIAGNOSIS — I609 Nontraumatic subarachnoid hemorrhage, unspecified: Secondary | ICD-10-CM | POA: Diagnosis not present

## 2012-08-14 DIAGNOSIS — R269 Unspecified abnormalities of gait and mobility: Secondary | ICD-10-CM | POA: Diagnosis not present

## 2012-08-14 DIAGNOSIS — E119 Type 2 diabetes mellitus without complications: Secondary | ICD-10-CM | POA: Diagnosis not present

## 2012-08-14 MED ORDER — IOHEXOL 300 MG/ML  SOLN
75.0000 mL | Freq: Once | INTRAMUSCULAR | Status: AC | PRN
  Administered 2012-08-14: 75 mL via INTRAVENOUS

## 2012-08-15 ENCOUNTER — Other Ambulatory Visit: Payer: Self-pay | Admitting: Neurosurgery

## 2012-08-15 DIAGNOSIS — I609 Nontraumatic subarachnoid hemorrhage, unspecified: Secondary | ICD-10-CM

## 2012-08-16 DIAGNOSIS — E119 Type 2 diabetes mellitus without complications: Secondary | ICD-10-CM | POA: Diagnosis not present

## 2012-08-16 DIAGNOSIS — G4733 Obstructive sleep apnea (adult) (pediatric): Secondary | ICD-10-CM | POA: Diagnosis not present

## 2012-08-16 DIAGNOSIS — IMO0001 Reserved for inherently not codable concepts without codable children: Secondary | ICD-10-CM | POA: Diagnosis not present

## 2012-08-16 DIAGNOSIS — Z48812 Encounter for surgical aftercare following surgery on the circulatory system: Secondary | ICD-10-CM | POA: Diagnosis not present

## 2012-08-16 DIAGNOSIS — I4891 Unspecified atrial fibrillation: Secondary | ICD-10-CM | POA: Diagnosis not present

## 2012-08-16 DIAGNOSIS — R269 Unspecified abnormalities of gait and mobility: Secondary | ICD-10-CM | POA: Diagnosis not present

## 2012-08-19 DIAGNOSIS — E119 Type 2 diabetes mellitus without complications: Secondary | ICD-10-CM | POA: Diagnosis not present

## 2012-08-19 DIAGNOSIS — G4733 Obstructive sleep apnea (adult) (pediatric): Secondary | ICD-10-CM | POA: Diagnosis not present

## 2012-08-19 DIAGNOSIS — Z48812 Encounter for surgical aftercare following surgery on the circulatory system: Secondary | ICD-10-CM | POA: Diagnosis not present

## 2012-08-19 DIAGNOSIS — R269 Unspecified abnormalities of gait and mobility: Secondary | ICD-10-CM | POA: Diagnosis not present

## 2012-08-19 DIAGNOSIS — IMO0001 Reserved for inherently not codable concepts without codable children: Secondary | ICD-10-CM | POA: Diagnosis not present

## 2012-08-19 DIAGNOSIS — I4891 Unspecified atrial fibrillation: Secondary | ICD-10-CM | POA: Diagnosis not present

## 2012-08-21 DIAGNOSIS — I4891 Unspecified atrial fibrillation: Secondary | ICD-10-CM | POA: Diagnosis not present

## 2012-08-21 DIAGNOSIS — G4733 Obstructive sleep apnea (adult) (pediatric): Secondary | ICD-10-CM | POA: Diagnosis not present

## 2012-08-21 DIAGNOSIS — E119 Type 2 diabetes mellitus without complications: Secondary | ICD-10-CM | POA: Diagnosis not present

## 2012-08-21 DIAGNOSIS — IMO0001 Reserved for inherently not codable concepts without codable children: Secondary | ICD-10-CM | POA: Diagnosis not present

## 2012-08-21 DIAGNOSIS — Z48812 Encounter for surgical aftercare following surgery on the circulatory system: Secondary | ICD-10-CM | POA: Diagnosis not present

## 2012-08-21 DIAGNOSIS — R269 Unspecified abnormalities of gait and mobility: Secondary | ICD-10-CM | POA: Diagnosis not present

## 2012-08-22 DIAGNOSIS — Z48812 Encounter for surgical aftercare following surgery on the circulatory system: Secondary | ICD-10-CM | POA: Diagnosis not present

## 2012-08-22 DIAGNOSIS — R269 Unspecified abnormalities of gait and mobility: Secondary | ICD-10-CM | POA: Diagnosis not present

## 2012-08-22 DIAGNOSIS — E119 Type 2 diabetes mellitus without complications: Secondary | ICD-10-CM | POA: Diagnosis not present

## 2012-08-22 DIAGNOSIS — I4891 Unspecified atrial fibrillation: Secondary | ICD-10-CM | POA: Diagnosis not present

## 2012-08-22 DIAGNOSIS — IMO0001 Reserved for inherently not codable concepts without codable children: Secondary | ICD-10-CM | POA: Diagnosis not present

## 2012-08-22 DIAGNOSIS — G4733 Obstructive sleep apnea (adult) (pediatric): Secondary | ICD-10-CM | POA: Diagnosis not present

## 2012-08-23 DIAGNOSIS — R269 Unspecified abnormalities of gait and mobility: Secondary | ICD-10-CM | POA: Diagnosis not present

## 2012-08-23 DIAGNOSIS — G4733 Obstructive sleep apnea (adult) (pediatric): Secondary | ICD-10-CM | POA: Diagnosis not present

## 2012-08-23 DIAGNOSIS — E119 Type 2 diabetes mellitus without complications: Secondary | ICD-10-CM | POA: Diagnosis not present

## 2012-08-23 DIAGNOSIS — IMO0001 Reserved for inherently not codable concepts without codable children: Secondary | ICD-10-CM | POA: Diagnosis not present

## 2012-08-23 DIAGNOSIS — I4891 Unspecified atrial fibrillation: Secondary | ICD-10-CM | POA: Diagnosis not present

## 2012-08-23 DIAGNOSIS — Z48812 Encounter for surgical aftercare following surgery on the circulatory system: Secondary | ICD-10-CM | POA: Diagnosis not present

## 2012-08-26 DIAGNOSIS — Z1212 Encounter for screening for malignant neoplasm of rectum: Secondary | ICD-10-CM | POA: Diagnosis not present

## 2012-08-29 DIAGNOSIS — R269 Unspecified abnormalities of gait and mobility: Secondary | ICD-10-CM | POA: Diagnosis not present

## 2012-08-29 DIAGNOSIS — E119 Type 2 diabetes mellitus without complications: Secondary | ICD-10-CM | POA: Diagnosis not present

## 2012-08-29 DIAGNOSIS — IMO0001 Reserved for inherently not codable concepts without codable children: Secondary | ICD-10-CM | POA: Diagnosis not present

## 2012-08-29 DIAGNOSIS — I4891 Unspecified atrial fibrillation: Secondary | ICD-10-CM | POA: Diagnosis not present

## 2012-08-29 DIAGNOSIS — Z48812 Encounter for surgical aftercare following surgery on the circulatory system: Secondary | ICD-10-CM | POA: Diagnosis not present

## 2012-08-29 DIAGNOSIS — G4733 Obstructive sleep apnea (adult) (pediatric): Secondary | ICD-10-CM | POA: Diagnosis not present

## 2012-09-05 DIAGNOSIS — D509 Iron deficiency anemia, unspecified: Secondary | ICD-10-CM | POA: Diagnosis not present

## 2012-09-23 ENCOUNTER — Telehealth: Payer: Self-pay | Admitting: Cardiology

## 2012-09-23 NOTE — Telephone Encounter (Signed)
Spoke with [name], he wanted to let dr Jens Som know that dr Jillyn Hidden cram told him he could no longer take coumadin, he was not a candidate. He wanted to make sure dr Jens Som agreed and was aware.

## 2012-09-23 NOTE — Telephone Encounter (Signed)
Patient had a spontaneous subdural hematoma; no further coumadin Olga Millers

## 2012-09-23 NOTE — Telephone Encounter (Signed)
plz return call to pt at hm# regarding medication hold

## 2012-09-24 NOTE — Telephone Encounter (Signed)
Spoke with pt, Aware of dr crenshaw's recommendations.  °

## 2012-10-04 ENCOUNTER — Ambulatory Visit
Admission: RE | Admit: 2012-10-04 | Discharge: 2012-10-04 | Disposition: A | Discharge status: Home / Self Care | Payer: Medicare Other | Source: Ambulatory Visit | Attending: Neurosurgery | Admitting: Neurosurgery

## 2012-10-04 DIAGNOSIS — R51 Headache: Secondary | ICD-10-CM | POA: Diagnosis not present

## 2012-10-04 DIAGNOSIS — I609 Nontraumatic subarachnoid hemorrhage, unspecified: Secondary | ICD-10-CM

## 2012-10-10 DIAGNOSIS — I10 Essential (primary) hypertension: Secondary | ICD-10-CM | POA: Diagnosis not present

## 2012-10-10 DIAGNOSIS — R809 Proteinuria, unspecified: Secondary | ICD-10-CM | POA: Diagnosis not present

## 2012-10-15 DIAGNOSIS — R404 Transient alteration of awareness: Secondary | ICD-10-CM | POA: Diagnosis not present

## 2012-10-15 DIAGNOSIS — I10 Essential (primary) hypertension: Secondary | ICD-10-CM | POA: Diagnosis not present

## 2012-10-15 DIAGNOSIS — D649 Anemia, unspecified: Secondary | ICD-10-CM | POA: Diagnosis not present

## 2012-10-15 DIAGNOSIS — G4733 Obstructive sleep apnea (adult) (pediatric): Secondary | ICD-10-CM | POA: Diagnosis not present

## 2012-11-01 ENCOUNTER — Other Ambulatory Visit: Payer: Self-pay | Admitting: Cardiology

## 2012-11-06 ENCOUNTER — Encounter: Payer: Self-pay | Admitting: *Deleted

## 2012-11-11 ENCOUNTER — Encounter: Payer: Self-pay | Admitting: Internal Medicine

## 2012-11-11 ENCOUNTER — Ambulatory Visit (INDEPENDENT_AMBULATORY_CARE_PROVIDER_SITE_OTHER): Payer: Medicare Other | Admitting: *Deleted

## 2012-11-11 DIAGNOSIS — I499 Cardiac arrhythmia, unspecified: Secondary | ICD-10-CM | POA: Diagnosis not present

## 2012-11-11 LAB — PACEMAKER DEVICE OBSERVATION
AL AMPLITUDE: 2.8 mv
AL IMPEDENCE PM: 468 Ohm
AL THRESHOLD: 1 V
ATRIAL PACING PM: 95
BAMS-0001: 150 {beats}/min
BAMS-0003: 70 {beats}/min
BATTERY VOLTAGE: 2.78 V
DEVICE MODEL PM: 2109558
RV LEAD AMPLITUDE: 7.5 mv
RV LEAD IMPEDENCE PM: 582 Ohm
RV LEAD THRESHOLD: 0.5 V
VENTRICULAR PACING PM: 5.3

## 2012-11-11 NOTE — Progress Notes (Signed)
PPM check 

## 2013-01-14 DIAGNOSIS — R3915 Urgency of urination: Secondary | ICD-10-CM | POA: Diagnosis not present

## 2013-01-14 DIAGNOSIS — N138 Other obstructive and reflux uropathy: Secondary | ICD-10-CM | POA: Diagnosis not present

## 2013-01-14 DIAGNOSIS — N401 Enlarged prostate with lower urinary tract symptoms: Secondary | ICD-10-CM | POA: Diagnosis not present

## 2013-01-14 DIAGNOSIS — N200 Calculus of kidney: Secondary | ICD-10-CM | POA: Diagnosis not present

## 2013-01-15 DIAGNOSIS — L821 Other seborrheic keratosis: Secondary | ICD-10-CM | POA: Diagnosis not present

## 2013-01-15 DIAGNOSIS — D485 Neoplasm of uncertain behavior of skin: Secondary | ICD-10-CM | POA: Diagnosis not present

## 2013-01-15 DIAGNOSIS — L570 Actinic keratosis: Secondary | ICD-10-CM | POA: Diagnosis not present

## 2013-01-17 DIAGNOSIS — I10 Essential (primary) hypertension: Secondary | ICD-10-CM | POA: Diagnosis not present

## 2013-01-17 DIAGNOSIS — Z79899 Other long term (current) drug therapy: Secondary | ICD-10-CM | POA: Diagnosis not present

## 2013-01-20 ENCOUNTER — Telehealth: Payer: Self-pay | Admitting: Cardiology

## 2013-01-20 MED ORDER — METOPROLOL TARTRATE 50 MG PO TABS: 50.0000 mg | ORAL_TABLET | Freq: Two times a day (BID) | ORAL | Status: DC

## 2013-01-20 NOTE — Telephone Encounter (Signed)
Pt needs refill of  Metoprolol to [facility] for 90 day supply

## 2013-01-21 ENCOUNTER — Other Ambulatory Visit: Payer: Self-pay | Admitting: Neurosurgery

## 2013-01-21 DIAGNOSIS — M47812 Spondylosis without myelopathy or radiculopathy, cervical region: Secondary | ICD-10-CM

## 2013-01-21 DIAGNOSIS — R58 Hemorrhage, not elsewhere classified: Secondary | ICD-10-CM

## 2013-01-21 DIAGNOSIS — I609 Nontraumatic subarachnoid hemorrhage, unspecified: Secondary | ICD-10-CM | POA: Diagnosis not present

## 2013-01-24 ENCOUNTER — Ambulatory Visit
Admission: RE | Admit: 2013-01-24 | Discharge: 2013-01-24 | Disposition: A | Discharge status: Home / Self Care | Payer: Medicare Other | Source: Ambulatory Visit | Attending: Neurosurgery | Admitting: Neurosurgery

## 2013-01-24 DIAGNOSIS — M47812 Spondylosis without myelopathy or radiculopathy, cervical region: Secondary | ICD-10-CM

## 2013-01-24 DIAGNOSIS — R58 Hemorrhage, not elsewhere classified: Secondary | ICD-10-CM

## 2013-01-24 DIAGNOSIS — M5030 Other cervical disc degeneration, unspecified cervical region: Secondary | ICD-10-CM | POA: Diagnosis not present

## 2013-01-24 DIAGNOSIS — I6200 Nontraumatic subdural hemorrhage, unspecified: Secondary | ICD-10-CM | POA: Diagnosis not present

## 2013-02-03 ENCOUNTER — Encounter: Payer: Self-pay | Admitting: Cardiology

## 2013-02-03 DIAGNOSIS — Z125 Encounter for screening for malignant neoplasm of prostate: Secondary | ICD-10-CM | POA: Diagnosis not present

## 2013-02-03 DIAGNOSIS — E119 Type 2 diabetes mellitus without complications: Secondary | ICD-10-CM | POA: Diagnosis not present

## 2013-02-03 DIAGNOSIS — Z79899 Other long term (current) drug therapy: Secondary | ICD-10-CM | POA: Diagnosis not present

## 2013-02-03 DIAGNOSIS — D649 Anemia, unspecified: Secondary | ICD-10-CM | POA: Diagnosis not present

## 2013-02-03 DIAGNOSIS — E7800 Pure hypercholesterolemia, unspecified: Secondary | ICD-10-CM | POA: Diagnosis not present

## 2013-02-03 DIAGNOSIS — Z0000 Encounter for general adult medical examination without abnormal findings: Secondary | ICD-10-CM | POA: Diagnosis not present

## 2013-02-06 DIAGNOSIS — S066X9A Traumatic subarachnoid hemorrhage with loss of consciousness of unspecified duration, initial encounter: Secondary | ICD-10-CM | POA: Diagnosis not present

## 2013-02-06 DIAGNOSIS — S066XAA Traumatic subarachnoid hemorrhage with loss of consciousness status unknown, initial encounter: Secondary | ICD-10-CM | POA: Diagnosis not present

## 2013-02-07 DIAGNOSIS — Z9989 Dependence on other enabling machines and devices: Secondary | ICD-10-CM | POA: Diagnosis not present

## 2013-02-07 DIAGNOSIS — I2510 Atherosclerotic heart disease of native coronary artery without angina pectoris: Secondary | ICD-10-CM | POA: Diagnosis not present

## 2013-02-07 DIAGNOSIS — G4733 Obstructive sleep apnea (adult) (pediatric): Secondary | ICD-10-CM | POA: Diagnosis not present

## 2013-02-07 DIAGNOSIS — I10 Essential (primary) hypertension: Secondary | ICD-10-CM | POA: Diagnosis not present

## 2013-02-24 DIAGNOSIS — R809 Proteinuria, unspecified: Secondary | ICD-10-CM | POA: Diagnosis not present

## 2013-02-24 DIAGNOSIS — I10 Essential (primary) hypertension: Secondary | ICD-10-CM | POA: Diagnosis not present

## 2013-04-28 DIAGNOSIS — H04129 Dry eye syndrome of unspecified lacrimal gland: Secondary | ICD-10-CM | POA: Diagnosis not present

## 2013-04-28 DIAGNOSIS — H01009 Unspecified blepharitis unspecified eye, unspecified eyelid: Secondary | ICD-10-CM | POA: Diagnosis not present

## 2013-04-28 DIAGNOSIS — H25019 Cortical age-related cataract, unspecified eye: Secondary | ICD-10-CM | POA: Diagnosis not present

## 2013-05-08 DIAGNOSIS — S065X9A Traumatic subdural hemorrhage with loss of consciousness of unspecified duration, initial encounter: Secondary | ICD-10-CM | POA: Diagnosis not present

## 2013-05-15 ENCOUNTER — Ambulatory Visit (INDEPENDENT_AMBULATORY_CARE_PROVIDER_SITE_OTHER): Payer: Medicare Other | Admitting: Internal Medicine

## 2013-05-15 ENCOUNTER — Encounter: Payer: Self-pay | Admitting: Internal Medicine

## 2013-05-15 VITALS — BP 133/86 | HR 85 | Ht 68.0 in | Wt 236.0 lb

## 2013-05-15 DIAGNOSIS — Z950 Presence of cardiac pacemaker: Secondary | ICD-10-CM | POA: Diagnosis not present

## 2013-05-15 DIAGNOSIS — S065XAA Traumatic subdural hemorrhage with loss of consciousness status unknown, initial encounter: Secondary | ICD-10-CM

## 2013-05-15 DIAGNOSIS — I4891 Unspecified atrial fibrillation: Secondary | ICD-10-CM

## 2013-05-15 DIAGNOSIS — I6200 Nontraumatic subdural hemorrhage, unspecified: Secondary | ICD-10-CM

## 2013-05-15 DIAGNOSIS — I10 Essential (primary) hypertension: Secondary | ICD-10-CM

## 2013-05-15 DIAGNOSIS — I499 Cardiac arrhythmia, unspecified: Secondary | ICD-10-CM

## 2013-05-15 DIAGNOSIS — S065X9A Traumatic subdural hemorrhage with loss of consciousness of unspecified duration, initial encounter: Secondary | ICD-10-CM

## 2013-05-15 DIAGNOSIS — E669 Obesity, unspecified: Secondary | ICD-10-CM

## 2013-05-15 LAB — PACEMAKER DEVICE OBSERVATION
AL AMPLITUDE: 1.9 mv
AL IMPEDENCE PM: 478 Ohm
AL THRESHOLD: 1 V
ATRIAL PACING PM: 96
BAMS-0001: 150 {beats}/min
BAMS-0003: 70 {beats}/min
BATTERY VOLTAGE: 2.75 V
DEVICE MODEL PM: 2109558
RV LEAD AMPLITUDE: 8.2 mv
RV LEAD IMPEDENCE PM: 598 Ohm
RV LEAD THRESHOLD: 0.625 V
VENTRICULAR PACING PM: 5.6

## 2013-05-15 NOTE — Assessment & Plan Note (Signed)
I've instructed the patient to lose weight. He is encouraged to increase his physical activity.

## 2013-05-15 NOTE — Assessment & Plan Note (Signed)
His blood pressure is well controlled. He'll continue his current medical therapy. 

## 2013-05-15 NOTE — Assessment & Plan Note (Signed)
He is no longer on any anticoagulation. He will continue his current medical therapy.

## 2013-05-15 NOTE — Progress Notes (Signed)
HPI [name] returns today for followup. He is a pleasant 72 yo man with a h/o HTN, symptomatic bradycardia due to sinus node dysfunction. He is s/p PPM insertion. He denies chest pain or sob at rest. He does note dyspnea with exertion, especially with going up stairs. He has had mild peripheral edema. Since I saw him last, he developed a sub-dural hematoma and is s/p evacuation. He is no longer on any anti-coagulation. No Known Allergies   Current Outpatient Prescriptions  Medication Sig Dispense Refill  . furosemide (LASIX) 40 MG tablet Take 40 mg by mouth. Take 3-4 times per week, as needed. For fluid      . lisinopril (PRINIVIL,ZESTRIL) 2.5 MG tablet Take 1 tablet by mouth daily.      . metoprolol (LOPRESSOR) 50 MG tablet Take 1 tablet (50 mg total) by mouth 2 (two) times daily.  180 tablet  2  . MULTAQ 400 MG tablet TAKE 1 TABLET (400 MG TOTAL) BY MOUTH 2 (TWO) TIMES DAILY WITH A MEAL.  60 tablet  11  . omega-3 acid ethyl esters (LOVAZA) 1 G capsule Take 2 g by mouth 3 (three) times daily.      [name] oxybutynin (DITROPAN-XL) 10 MG 24 hr tablet daily.      . potassium chloride SA (K-DUR,KLOR-CON) 20 MEQ tablet Take 20 mEq by mouth daily.        . rosuvastatin (CRESTOR) 10 MG tablet Take 10 mg by mouth daily.        . sertraline (ZOLOFT) 100 MG tablet Take 100 mg by mouth daily.      . Tamsulosin HCl (FLOMAX) 0.4 MG CAPS Take by mouth at bedtime.        No current facility-administered medications for this visit.     Past Medical History  Diagnosis Date  . Pacemaker     PPM - St. Jude  . Atrial fibrillation   . CAD (coronary artery disease), native coronary artery   . DM (diabetes mellitus)   . Hyperlipidemia type II   . OSA (obstructive sleep apnea)   . BPH (benign prostatic hyperplasia)     ROS:   All systems reviewed and negative except as noted in the HPI.   Past Surgical History  Procedure Laterality Date  . Foot surgery    . Cholecystectomy    . Appendectomy    .  Orchiectomy      Left  /  testicular cancer  . Hernia repair    . Coronary artery bypass graft      x3 (left internal mammary artery to distal left anterior descending coronary artery, saphenous vain graft to second circumflex marginal branch, saphenous vain graft to posterior descending coronary artery, endoscopic saphenous vain harvest from right thigh) and modified Cox - Maze IV procedure.  [name]. [name],MD. Electronically signed CHO/MEDQ D: 01/09/2008/ JOB: [id number] cc:  Antionette Char MD  . Pacemaker placement      PPM - St. Jude  . Craniotomy  07/30/2012    Procedure: CRANIOTOMY HEMATOMA EVACUATION SUBDURAL;  Surgeon: Mariam Dollar, MD;  Location: [facility];  Service: Neurosurgery;  Laterality: Right;  Right craniotomy for evacuation of subdural hematoma     Family History  Problem Relation Age of Onset  . Heart disease Father   . Heart attack Father      History   Social History  . Marital Status: Married    Spouse Name: N/A    Number of Children: N/A  .  Years of Education: N/A   Occupational History  . Not on file.   Social History Main Topics  . Smoking status: Former Smoker    Quit date: 02/21/1991  . Smokeless tobacco: Not on file  . Alcohol Use: No  . Drug Use: No  . Sexually Active: Not on file   Other Topics Concern  . 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  He is a Emergency planning/management officer.       BP 133/86  Pulse 85  Ht 5\' 8"  (1.727 m)  Wt 236 lb (107.049 kg)  BMI 35.89 kg/m2  Physical Exam:  Well appearing, obese, 72 year old man, NAD HEENT: Unremarkable Neck:  7 cm JVD, no thyromegally Back:  No CVA tenderness Lungs:  Clear with no wheezes, rales, or rhonchi. HEART:  Regular rate rhythm, no murmurs, no rubs, no clicks Abd:  soft, obese, positive bowel sounds, no organomegally, no rebound, no guarding Ext:  2 plus pulses, no edema, no cyanosis, no clubbing Skin:  No rashes no nodules Neuro:  CN II through XII intact,  motor grossly intact  DEVICE  Normal device function.  See PaceArt for details.   Assess/Plan:

## 2013-05-15 NOTE — Assessment & Plan Note (Signed)
His St. Jude dual-chamber pacemaker is working normally. We'll plan to recheck in several months. 

## 2013-05-15 NOTE — Assessment & Plan Note (Signed)
He is maintaining sinus rhythm on Multaq. Pacemaker interrogation demonstrates 99% sinus rhythm.

## 2013-05-15 NOTE — Patient Instructions (Addendum)
Your physician wants you to follow-up in: 6 months in the device clinic and 12 months with Dr Taylor You will receive a reminder letter in the mail two months in advance. If you don't receive a letter, please call our office to schedule the follow-up appointment.  

## 2013-05-20 ENCOUNTER — Ambulatory Visit
Admission: RE | Admit: 2013-05-20 | Discharge: 2013-05-20 | Disposition: A | Discharge status: Home / Self Care | Payer: Medicare Other | Source: Ambulatory Visit | Attending: Nurse Practitioner | Admitting: Nurse Practitioner

## 2013-05-20 ENCOUNTER — Ambulatory Visit (HOSPITAL_COMMUNITY): Payer: Medicare Other | Attending: Cardiology | Admitting: Radiology

## 2013-05-20 ENCOUNTER — Other Ambulatory Visit (HOSPITAL_COMMUNITY): Payer: Self-pay | Admitting: Cardiology

## 2013-05-20 ENCOUNTER — Encounter: Payer: Self-pay | Admitting: Nurse Practitioner

## 2013-05-20 ENCOUNTER — Ambulatory Visit (INDEPENDENT_AMBULATORY_CARE_PROVIDER_SITE_OTHER): Payer: Medicare Other | Admitting: Nurse Practitioner

## 2013-05-20 VITALS — BP 108/68 | HR 76 | Ht 68.5 in | Wt 230.4 lb

## 2013-05-20 DIAGNOSIS — I2510 Atherosclerotic heart disease of native coronary artery without angina pectoris: Secondary | ICD-10-CM

## 2013-05-20 DIAGNOSIS — I259 Chronic ischemic heart disease, unspecified: Secondary | ICD-10-CM | POA: Diagnosis not present

## 2013-05-20 DIAGNOSIS — R0600 Dyspnea, unspecified: Secondary | ICD-10-CM

## 2013-05-20 DIAGNOSIS — R0602 Shortness of breath: Secondary | ICD-10-CM | POA: Diagnosis not present

## 2013-05-20 DIAGNOSIS — R609 Edema, unspecified: Secondary | ICD-10-CM

## 2013-05-20 DIAGNOSIS — R0609 Other forms of dyspnea: Secondary | ICD-10-CM | POA: Diagnosis not present

## 2013-05-20 DIAGNOSIS — R0989 Other specified symptoms and signs involving the circulatory and respiratory systems: Secondary | ICD-10-CM | POA: Diagnosis not present

## 2013-05-20 LAB — BASIC METABOLIC PANEL
BUN: 30 mg/dL — ABNORMAL HIGH (ref 6–23)
CO2: 26 mEq/L (ref 19–32)
Calcium: 9.5 mg/dL (ref 8.4–10.5)
Chloride: 108 mEq/L (ref 96–112)
Creatinine, Ser: 1.6 mg/dL — ABNORMAL HIGH (ref 0.4–1.5)
GFR: 45.93 mL/min — ABNORMAL LOW (ref >60.00)
Glucose, Bld: 105 mg/dL — ABNORMAL HIGH (ref 70–99)
Potassium: 4.4 mEq/L (ref 3.5–5.1)
Sodium: 141 mEq/L (ref 135–145)

## 2013-05-20 LAB — BRAIN NATRIURETIC PEPTIDE: Pro B Natriuretic peptide (BNP): 64 pg/mL (ref 0.0–100.0)

## 2013-05-20 LAB — CBC WITH DIFFERENTIAL/PLATELET
Basophils Absolute: 0 10*3/uL (ref 0.0–0.1)
Basophils Relative: 0.4 % (ref 0.0–3.0)
Eosinophils Absolute: 0.3 10*3/uL (ref 0.0–0.7)
Eosinophils Relative: 3.2 % (ref 0.0–5.0)
HCT: 41.5 % (ref 39.0–52.0)
Hemoglobin: 13.7 g/dL (ref 13.0–17.0)
Lymphocytes Relative: 16.1 % (ref 12.0–46.0)
Lymphs Abs: 1.3 10*3/uL (ref 0.7–4.0)
MCHC: 33.1 g/dL (ref 30.0–36.0)
MCV: 93.1 fl (ref 78.0–100.0)
Monocytes Absolute: 0.3 10*3/uL (ref 0.1–1.0)
Monocytes Relative: 3.2 % (ref 3.0–12.0)
Neutro Abs: 6.3 10*3/uL (ref 1.4–7.7)
Neutrophils Relative %: 77.1 % — ABNORMAL HIGH (ref 43.0–77.0)
Platelets: 173 10*3/uL (ref 150.0–400.0)
RBC: 4.46 Mil/uL (ref 4.22–5.81)
RDW: 16.1 % — ABNORMAL HIGH (ref 11.5–14.6)
WBC: 8.2 10*3/uL (ref 4.5–10.5)

## 2013-05-20 MED ORDER — PERFLUTREN PROTEIN A MICROSPH IV SUSP
2.0000 mL | Freq: Once | INTRAVENOUS | Status: AC
  Administered 2013-05-20: 2 mL via INTRAVENOUS

## 2013-05-20 NOTE — Progress Notes (Signed)
Echocardiogram performed with [facility].  

## 2013-05-20 NOTE — Patient Instructions (Addendum)
Stay on your current medicines for now.  We will send you for a CXR today - go to [facility] to [facility] on the first floor - you just walk in.  We need to check labs today  We will get an echocardiogram arranged and a breathing test  We may need to send you to the lung doctor based on these studies  Call the [facility] office at [phone] if you have any questions, problems or concerns.

## 2013-05-20 NOTE — Progress Notes (Signed)
[name] Date of Birth: 1941-03-05 Medical Record #[id number]  History of Present Illness: [name] is seen back today for a follow up visit. Seen for Dr. Jens Som and Dr. Ladona Ridgel. 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 He has known CAD with prior CABG x 3 with Cox Maze in 2009. Has had atrial fib - on Multaq. He has had a Myoview was performed at [facility] in June of 2010. Ejection fraction was 67%. The perfusion was normal. Echocardiogram in September of 2011 revealed normal LV function, moderate right atrial and right ventricular enlargement, and grade 2 diastolic dysfunction. The aortic root was mildly dilated. Abdominal ultrasound in March of 2012 showed no aneurysm. Since then, patient was in [place] and developed acute nausea/vomiting/dizziness/weakness and confusion. He was admitted to a hospital there and cardiac markers were negative. An echocardiogram showed an ejection fraction of 55%, mild biatrial enlargement, mild mitral regurgitation, moderate tricuspid regurgitation.   Was just here for his pacemaker check last week and was doing well. 99% sinus rhythm on device check.   Last seen by Dr. Jens Som in January of 2013.   Comes in today. He is here with his wife. He is here because of progressive DOE - this has been going on for the past couple of months. Not getting any better. Some cough - not productive. Dr. Ladona Ridgel advised him to take his Lasix every day. Weight is down 6 pounds. Feels bloated. Early satiety reported. Less swelling noted with the every day Lasix. No chest pain. Rhythm has been good. Not able to exercise due to the DOE. No PND/orthopnea.   Current Outpatient Prescriptions on File Prior to Visit  Medication Sig Dispense Refill  . furosemide (LASIX) 40 MG tablet Take 40 mg by mouth daily.       [name] lisinopril (PRINIVIL,ZESTRIL) 2.5 MG  tablet Take 1 tablet by mouth daily.      . metoprolol (LOPRESSOR) 50 MG tablet Take 1 tablet (50 mg total) by mouth 2 (two) times daily.  180 tablet  2  . MULTAQ 400 MG tablet TAKE 1 TABLET (400 MG TOTAL) BY MOUTH 2 (TWO) TIMES DAILY WITH A MEAL.  60 tablet  11  . omega-3 acid ethyl esters (LOVAZA) 1 G capsule Take 2 g by mouth 3 (three) times daily.      [name] oxybutynin (DITROPAN-XL) 10 MG 24 hr tablet daily.      . potassium chloride SA (K-DUR,KLOR-CON) 20 MEQ tablet Take 20 mEq by mouth daily.        . rosuvastatin (CRESTOR) 10 MG tablet Take 10 mg by mouth daily.        . sertraline (ZOLOFT) 100 MG tablet Take 100 mg by mouth daily.      . Tamsulosin HCl (FLOMAX) 0.4 MG CAPS Take by mouth at bedtime.        No current facility-administered medications on file prior to visit.    No Known Allergies  Past Medical History  Diagnosis Date  . Pacemaker     PPM - St. Jude  . Atrial fibrillation   . CAD (coronary artery disease), native coronary artery   . DM (diabetes mellitus)   . Hyperlipidemia type II   . OSA (obstructive sleep apnea)   . BPH (benign prostatic hyperplasia)     Past Surgical History  Procedure Laterality Date  . Foot surgery    .  Cholecystectomy    . Appendectomy    . Orchiectomy      Left  /  testicular cancer  . Hernia repair    . Coronary artery bypass graft      x3 (left internal mammary artery to distal left anterior descending coronary artery, saphenous vain graft to second circumflex marginal branch, saphenous vain graft to posterior descending coronary artery, endoscopic saphenous vain harvest from right thigh) and modified Cox - Maze IV procedure.  Jamie Decent. [name],MD. Electronically signed CHO/MEDQ D: 01/09/2008/ JOB: [id number] cc:  Antionette Char MD  . Pacemaker placement      PPM - St. Jude  . Craniotomy  07/30/2012    Procedure: CRANIOTOMY HEMATOMA EVACUATION SUBDURAL;  Surgeon: Mariam Dollar, MD;  Location: [facility];  Service: Neurosurgery;   Laterality: Right;  Right craniotomy for evacuation of subdural hematoma    History  Smoking status  . Former Smoker  . Quit date: 02/21/1991  Smokeless tobacco  . Not on file    History  Alcohol Use No    Family History  Problem Relation Age of Onset  . Heart disease Father   . Heart attack Father   . Heart failure Father   . Heart disease Mother   . Alzheimer's disease Mother     Review of Systems: The review of systems is per the HPI.  All other systems were reviewed and are negative.  Physical Exam: BP 108/68  Pulse 76  Ht 5' 8.5" (1.74 m)  Wt 230 lb 6.4 oz (104.509 kg)  BMI 34.52 kg/m2  SpO2 94% Oxygen sat at rest was 94%. With walking from POD M to POD A - he dropped to 87%.  Patient is very pleasant and in no acute distress. He is obese.  Weight is down 6 pounds from last week's visit. Skin is warm and dry. Color is normal.  HEENT is unremarkable. Normocephalic/atraumatic. PERRL. Sclera are nonicteric. Neck is supple. No masses. No JVD. Lungs are clear. Cardiac exam shows a regular rate and rhythm. Abdomen is obese but soft. Extremities are without edema. Has brawny stasis changes on his lower legs. Gait and ROM are intact. No gross neurologic deficits noted.  LABORATORY DATA: PENDING  Lab Results  Component Value Date   WBC 7.4 08/02/2012   HGB 10.4* 08/02/2012   HCT 30.7* 08/02/2012   PLT 134* 08/02/2012   GLUCOSE 93 08/02/2012   ALT 17 07/30/2012   AST 25 07/30/2012   NA 138 08/02/2012   K 3.8 08/02/2012   CL 102 08/02/2012   CREATININE 0.92 08/02/2012   BUN 18 08/02/2012   CO2 27 08/02/2012   INR 1.27 07/31/2012   HGBA1C 5.7* 07/30/2012    Assessment / Plan: 1. CAD - prior CABG and MAZE - no chest pain reported.   2. PAF - maintaining sinus on exam and on recent device check - on Multaq.   3. HTN - BP looks good.   4. Progressive DOE - needs CXR, updated echo, labs today and PFT's. May need referral to pulmonary - his wife sees Dr. Delford Field - will see how  his studies come out. For now, no change in his medicines but will continue the daily Lasix.   Further disposition to follow.   Patient is agreeable to this plan and will call if any problems develop in the interim.   Rosalio Macadamia, RN, ANP-C [facility] [address] [place], [place]  [address]

## 2013-05-22 ENCOUNTER — Encounter: Payer: Self-pay | Admitting: Internal Medicine

## 2013-05-28 DIAGNOSIS — H01009 Unspecified blepharitis unspecified eye, unspecified eyelid: Secondary | ICD-10-CM | POA: Diagnosis not present

## 2013-06-11 ENCOUNTER — Ambulatory Visit (INDEPENDENT_AMBULATORY_CARE_PROVIDER_SITE_OTHER): Payer: Medicare Other | Admitting: Internal Medicine

## 2013-06-11 DIAGNOSIS — I2510 Atherosclerotic heart disease of native coronary artery without angina pectoris: Secondary | ICD-10-CM

## 2013-06-11 DIAGNOSIS — I259 Chronic ischemic heart disease, unspecified: Secondary | ICD-10-CM

## 2013-06-11 DIAGNOSIS — R0600 Dyspnea, unspecified: Secondary | ICD-10-CM

## 2013-06-11 LAB — PULMONARY FUNCTION TEST

## 2013-06-11 NOTE — Progress Notes (Signed)
PFT done today. 

## 2013-06-12 ENCOUNTER — Encounter: Payer: Self-pay | Admitting: Critical Care Medicine

## 2013-06-12 ENCOUNTER — Ambulatory Visit (INDEPENDENT_AMBULATORY_CARE_PROVIDER_SITE_OTHER): Payer: Medicare Other | Admitting: Critical Care Medicine

## 2013-06-12 ENCOUNTER — Ambulatory Visit (HOSPITAL_BASED_OUTPATIENT_CLINIC_OR_DEPARTMENT_OTHER)
Admission: RE | Admit: 2013-06-12 | Discharge: 2013-06-12 | Disposition: A | Discharge status: Home / Self Care | Payer: Medicare Other | Source: Ambulatory Visit | Attending: Critical Care Medicine | Admitting: Critical Care Medicine

## 2013-06-12 ENCOUNTER — Other Ambulatory Visit: Payer: Self-pay | Admitting: Critical Care Medicine

## 2013-06-12 VITALS — BP 120/80 | HR 78 | Temp 97.9°F | Ht 67.0 in | Wt 234.0 lb

## 2013-06-12 DIAGNOSIS — R911 Solitary pulmonary nodule: Secondary | ICD-10-CM | POA: Diagnosis not present

## 2013-06-12 DIAGNOSIS — G4730 Sleep apnea, unspecified: Secondary | ICD-10-CM

## 2013-06-12 DIAGNOSIS — J984 Other disorders of lung: Secondary | ICD-10-CM | POA: Diagnosis not present

## 2013-06-12 DIAGNOSIS — Z951 Presence of aortocoronary bypass graft: Secondary | ICD-10-CM | POA: Insufficient documentation

## 2013-06-12 DIAGNOSIS — Z950 Presence of cardiac pacemaker: Secondary | ICD-10-CM | POA: Insufficient documentation

## 2013-06-12 DIAGNOSIS — R0602 Shortness of breath: Secondary | ICD-10-CM | POA: Diagnosis not present

## 2013-06-12 DIAGNOSIS — J8410 Pulmonary fibrosis, unspecified: Secondary | ICD-10-CM

## 2013-06-12 NOTE — Progress Notes (Addendum)
Subjective:    Patient ID: [name]., male    DOB: 12/18/1941, 72 y.o.   MRN: [id number]  HPI Comments: Shortness of breath,  Started after surgery on Brain 07/2012, hemorrhage, [name] DOE, worse up hills or if carrying >25# , difficult bending over  Shortness of Breath This is a chronic problem. The current episode started more than 1 year ago. Episode frequency: Pt walks about 3 mi per day, unable to walk hills, legs and knees feel like jelly and dyspneic. The problem has been unchanged. Associated symptoms include leg swelling and wheezing. Pertinent negatives include no abdominal pain, chest pain, claudication, coryza, ear pain, fever, headaches, hemoptysis, leg pain, neck pain, orthopnea, PND, rash, rhinorrhea, sore throat, sputum production, swollen glands, syncope or vomiting. 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 Risk factors include smoking. He has tried nothing for the symptoms. His past medical history is significant for CAD. There is no history of allergies, aspirin allergies, asthma, bronchiolitis, chronic lung disease, COPD, DVT, a heart failure, PE, pneumonia or a recent surgery.    Past Medical History  Diagnosis Date  . Pacemaker     PPM - St. Jude  . Atrial fibrillation   . CAD (coronary artery disease), native coronary artery   . DM (diabetes mellitus)   . Hyperlipidemia type II   . OSA (obstructive sleep apnea)   . BPH (benign prostatic hyperplasia)      Family History  Problem Relation Age of Onset  . Heart disease Father   . Heart attack Father   . Heart failure Father   . Heart disease Mother   . Alzheimer's disease Mother      History   Social History  . Marital Status: Married    Spouse Name: N/A    Number of Children: N/A  . Years of Education: N/A   Occupational History  . Not on file.   Social History Main Topics  . Smoking status: Former Smoker    Quit date: 02/21/1991  .  Smokeless tobacco: Not on file  . Alcohol Use: No  . Drug Use: No  . Sexually Active: Not Currently   Other Topics Concern  . 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  He is a Emergency planning/management officer.       No Known Allergies   Outpatient Prescriptions Prior to Visit  Medication Sig Dispense Refill  . furosemide (LASIX) 40 MG tablet Take 40 mg by mouth daily.       [name] lisinopril (PRINIVIL,ZESTRIL) 2.5 MG tablet Take 1 tablet by mouth daily.      . metoprolol (LOPRESSOR) 50 MG tablet Take 1 tablet (50 mg total) by mouth 2 (two) times daily.  180 tablet  2  . MULTAQ 400 MG tablet TAKE 1 TABLET (400 MG TOTAL) BY MOUTH 2 (TWO) TIMES DAILY WITH A MEAL.  60 tablet  11  . omega-3 acid ethyl esters (LOVAZA) 1 G capsule Take 2 g by mouth 3 (three) times daily.      [name] oxybutynin (DITROPAN-XL) 10 MG 24 hr tablet daily.      . potassium chloride SA (K-DUR,KLOR-CON) 20 MEQ tablet Take 20 mEq by mouth daily.        . rosuvastatin (CRESTOR) 10 MG tablet Take 10 mg by mouth daily.        . sertraline (ZOLOFT) 100 MG tablet Take 100 mg by mouth daily.      [name]  Tamsulosin HCl (FLOMAX) 0.4 MG CAPS Take by mouth at bedtime.        No facility-administered medications prior to visit.      Review of Systems  Constitutional: Negative for fever, chills, diaphoresis, activity change, appetite change, fatigue and unexpected weight change.  HENT: Positive for congestion and sneezing. Negative for hearing loss, ear pain, nosebleeds, sore throat, facial swelling, rhinorrhea, mouth sores, trouble swallowing, neck pain, neck stiffness, dental problem, voice change, postnasal drip, sinus pressure, tinnitus and ear discharge.   Eyes: Negative for photophobia, discharge, itching and visual disturbance.  Respiratory: Positive for cough, shortness of breath and wheezing. Negative for apnea, hemoptysis, sputum production, choking, chest tightness and stridor.   Cardiovascular: Positive for leg  swelling. Negative for chest pain, palpitations, orthopnea, claudication, syncope and PND.  Gastrointestinal: Negative for nausea, vomiting, abdominal pain, constipation, blood in stool and abdominal distention.       Indigestion  Genitourinary: Negative for dysuria, urgency, frequency, hematuria, flank pain, decreased urine volume and difficulty urinating.  Musculoskeletal: Negative for myalgias, back pain, joint swelling, arthralgias and gait problem.  Skin: Negative for color change, pallor and rash.  Neurological: Negative for dizziness, tremors, seizures, syncope, speech difficulty, weakness, light-headedness, numbness and headaches.  Hematological: Negative for adenopathy. Does not bruise/bleed easily.  Psychiatric/Behavioral: Negative for confusion, sleep disturbance and agitation. The patient is not nervous/anxious.        Objective:   Physical Exam Filed Vitals:   06/12/13 1425  BP: 120/80  Pulse: 78  Temp: 97.9 F (36.6 C)  TempSrc: Oral  Height: 5\' 7"  (1.702 m)  Weight: 106.142 kg (234 lb)  SpO2: 98%    Gen: Pleasant, well-nourished, in no distress,  normal affect  ENT: No lesions,  mouth clear,  oropharynx clear, no postnasal drip  Neck: No JVD, no TMG, no carotid bruits  Lungs: No use of accessory muscles, no dullness to percussion, dry rales at bases  Cardiovascular: RRR, heart sounds normal, no murmur or gallops, no peripheral edema  Abdomen: protuberant.  NT, no HSM,  BS normal  Musculoskeletal: No deformities, no cyanosis or clubbing  Neuro: alert, non focal  Skin: Warm, no lesions or rashes  Ct Chest Wo Contrast  06/12/2013   *RADIOLOGY REPORT*  Clinical Data: Shortness of breath, restrictive lung disease  CT CHEST WITHOUT CONTRAST  Technique:  Multidetector CT imaging of the chest was performed following the high-resolution protocol without IV contrast.  Comparison: Chest radiograph of May 20, 2013.  Findings: Left-sided pacemaker is again noted. Status  post coronary artery bypass graft.  No definite evidence of pneumonia or significant atelectasis is noted.  There are minimal subpleural interstitial densities seen laterally in the right upper lobe consistent with scarring.  Similar but milder findings as seen on the left. However, there is significantly more subpleural interstitial densities involving both lung bases peripherally with a probable minimal amount of peripheral honeycombing.  Minimal bronchiectasis is also noted in both lower lobes.  10 mm nodule is noted in the right lower lobe posteriorly.  IMPRESSION: Findings are most consistent with mild pulmonary fibrosis involving the lower lobes predominately.  Status post coronary artery bypass graft.  10 mm right lower lobe nodule is noted; unenhanced follow- up CT scan in 3 months is recommended to ensure stability and rule out neoplasm.   Original Report Authenticated By: Lupita Raider.,  M.D.    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 Toxicity although not as frequent as Amiodarone. Plan Obtain autoimmune serology Weight loss  Discuss with Cardiology discontinuance of Multaq Hold on steroids as of yet Reflux diet   SLEEP APNEA Sleep apnea per PCP who is treating with cpap therapy    Updated Medication List Outpatient Encounter Prescriptions as of 06/12/2013  Medication Sig Dispense Refill  . furosemide (LASIX) 40 MG tablet Take 40 mg by mouth daily.       [name] lisinopril (PRINIVIL,ZESTRIL) 2.5 MG tablet Take 1 tablet by mouth daily.      . metoprolol (LOPRESSOR) 50 MG tablet Take 1 tablet (50 mg total) by mouth 2  (two) times daily.  180 tablet  2  . MULTAQ 400 MG tablet TAKE 1 TABLET (400 MG TOTAL) BY MOUTH 2 (TWO) TIMES DAILY WITH A MEAL.  60 tablet  11  . omega-3 acid ethyl esters (LOVAZA) 1 G capsule Take 2 g by mouth 3 (three) times daily.      [name] oxybutynin (DITROPAN-XL) 10 MG 24 hr tablet daily.      . potassium chloride SA (K-DUR,KLOR-CON) 20 MEQ tablet Take 20 mEq by mouth daily.        . rosuvastatin (CRESTOR) 10 MG tablet Take 10 mg by mouth daily.        . sertraline (ZOLOFT) 100 MG tablet Take 100 mg by mouth daily.      . Tamsulosin HCl (FLOMAX) 0.4 MG CAPS Take by mouth at bedtime.        No facility-administered encounter medications on file as of 06/12/2013.

## 2013-06-12 NOTE — Patient Instructions (Addendum)
Ct Chest will be obtained No change in medications  Focus on weight loss

## 2013-06-13 DIAGNOSIS — J8410 Pulmonary fibrosis, unspecified: Secondary | ICD-10-CM | POA: Insufficient documentation

## 2013-06-13 NOTE — Assessment & Plan Note (Signed)
Mild peripheral and basilar pulmonary fibrosis without ground glass changes and evidence for acute inflammation Reflux and increased abd girth playing a role in dyspnea PFTs reveal reduced DLCO and Lung Volumes to 50% level Pt is on Multaq and this COULD be a factor in the pts dyspnea and ? Toxicity although not as frequent as Amiodarone. Plan Obtain autoimmune serology Weight loss  Discuss with Cardiology discontinuance of Multaq Hold on steroids as of yet Reflux diet

## 2013-06-13 NOTE — Assessment & Plan Note (Signed)
Sleep apnea per PCP who is treating with cpap therapy

## 2013-06-16 ENCOUNTER — Other Ambulatory Visit: Payer: Medicare Other

## 2013-06-16 DIAGNOSIS — J8410 Pulmonary fibrosis, unspecified: Secondary | ICD-10-CM | POA: Diagnosis not present

## 2013-06-16 LAB — RHEUMATOID FACTOR: Rhuematoid fact SerPl-aCnc: <10 IU/mL (ref <=14)

## 2013-06-16 LAB — C-REACTIVE PROTEIN: CRP: <0.5 mg/dL (ref <0.60)

## 2013-06-17 LAB — CYCLIC CITRUL PEPTIDE ANTIBODY, IGG: Cyclic Citrullin Peptide Ab: <2 U/mL (ref 0.0–5.0)

## 2013-06-17 LAB — ANA: Anti Nuclear Antibody(ANA): NEGATIVE

## 2013-06-19 DIAGNOSIS — I2510 Atherosclerotic heart disease of native coronary artery without angina pectoris: Secondary | ICD-10-CM | POA: Diagnosis not present

## 2013-06-19 DIAGNOSIS — Z006 Encounter for examination for normal comparison and control in clinical research program: Secondary | ICD-10-CM | POA: Diagnosis not present

## 2013-06-19 DIAGNOSIS — M25559 Pain in unspecified hip: Secondary | ICD-10-CM | POA: Diagnosis not present

## 2013-06-19 DIAGNOSIS — I6529 Occlusion and stenosis of unspecified carotid artery: Secondary | ICD-10-CM | POA: Diagnosis not present

## 2013-06-19 DIAGNOSIS — I10 Essential (primary) hypertension: Secondary | ICD-10-CM | POA: Diagnosis not present

## 2013-06-19 NOTE — Progress Notes (Signed)
Quick Note:  Called, spoke with pt. Informed him of results and recs per Dr. Delford Field.  He verbalized understanding of both and voiced no further questions or concerns at this time. ______

## 2013-06-23 ENCOUNTER — Telehealth: Payer: Self-pay | Admitting: Critical Care Medicine

## 2013-06-23 LAB — HYPERSENSITIVITY PNUEMONITIS PROFILE

## 2013-06-23 NOTE — Telephone Encounter (Signed)
I spoke to the pt and told him to STOP MULTAQ and Dr Rosette Reveal and Jens Som are both aware and concur.

## 2013-07-07 DIAGNOSIS — M2040 Other hammer toe(s) (acquired), unspecified foot: Secondary | ICD-10-CM | POA: Diagnosis not present

## 2013-07-07 DIAGNOSIS — M21619 Bunion of unspecified foot: Secondary | ICD-10-CM | POA: Diagnosis not present

## 2013-07-09 DIAGNOSIS — M2040 Other hammer toe(s) (acquired), unspecified foot: Secondary | ICD-10-CM | POA: Diagnosis not present

## 2013-07-09 DIAGNOSIS — M21619 Bunion of unspecified foot: Secondary | ICD-10-CM | POA: Diagnosis not present

## 2013-08-05 DIAGNOSIS — E119 Type 2 diabetes mellitus without complications: Secondary | ICD-10-CM | POA: Diagnosis not present

## 2013-08-05 DIAGNOSIS — E7800 Pure hypercholesterolemia, unspecified: Secondary | ICD-10-CM | POA: Diagnosis not present

## 2013-08-05 DIAGNOSIS — Z79899 Other long term (current) drug therapy: Secondary | ICD-10-CM | POA: Diagnosis not present

## 2013-08-05 DIAGNOSIS — I10 Essential (primary) hypertension: Secondary | ICD-10-CM | POA: Diagnosis not present

## 2013-08-06 ENCOUNTER — Encounter: Payer: Self-pay | Admitting: Physician Assistant

## 2013-08-06 ENCOUNTER — Telehealth: Payer: Self-pay | Admitting: Cardiology

## 2013-08-06 DIAGNOSIS — M2040 Other hammer toe(s) (acquired), unspecified foot: Secondary | ICD-10-CM | POA: Diagnosis not present

## 2013-08-06 DIAGNOSIS — M21619 Bunion of unspecified foot: Secondary | ICD-10-CM | POA: Diagnosis not present

## 2013-08-06 NOTE — Telephone Encounter (Signed)
Spoke with [name], aware the [name] will need to be seen before clearance. Made her aware the first opening dr Jens Som has is 08-20-13. They request the [name] be seen sooner for clearance because he is in a lot of pain and they already have him scheduled for next week. The [name] will see scott weaver pac tomorrow in the [address] office. [name] will make the [name] aware.

## 2013-08-06 NOTE — Telephone Encounter (Signed)
Spoke with [name], the pt is needing to have bone removed from toes on the right and left foot. Will forward for dr Jens Som review

## 2013-08-06 NOTE — Telephone Encounter (Signed)
New problem    from office note on  6/3 can patient be clear for surgery on  8/27 .

## 2013-08-06 NOTE — Telephone Encounter (Signed)
Needs OV [name]

## 2013-08-07 ENCOUNTER — Ambulatory Visit (INDEPENDENT_AMBULATORY_CARE_PROVIDER_SITE_OTHER): Payer: Medicare Other | Admitting: Physician Assistant

## 2013-08-07 ENCOUNTER — Encounter: Payer: Self-pay | Admitting: Physician Assistant

## 2013-08-07 VITALS — BP 120/80 | HR 70 | Ht 68.5 in | Wt 228.0 lb

## 2013-08-07 DIAGNOSIS — Z01810 Encounter for preprocedural cardiovascular examination: Secondary | ICD-10-CM | POA: Diagnosis not present

## 2013-08-07 DIAGNOSIS — S065X9A Traumatic subdural hemorrhage with loss of consciousness of unspecified duration, initial encounter: Secondary | ICD-10-CM

## 2013-08-07 DIAGNOSIS — I6200 Nontraumatic subdural hemorrhage, unspecified: Secondary | ICD-10-CM

## 2013-08-07 DIAGNOSIS — I10 Essential (primary) hypertension: Secondary | ICD-10-CM

## 2013-08-07 DIAGNOSIS — R0602 Shortness of breath: Secondary | ICD-10-CM | POA: Diagnosis not present

## 2013-08-07 DIAGNOSIS — I4891 Unspecified atrial fibrillation: Secondary | ICD-10-CM | POA: Diagnosis not present

## 2013-08-07 DIAGNOSIS — I2510 Atherosclerotic heart disease of native coronary artery without angina pectoris: Secondary | ICD-10-CM

## 2013-08-07 DIAGNOSIS — S065XAA Traumatic subdural hemorrhage with loss of consciousness status unknown, initial encounter: Secondary | ICD-10-CM

## 2013-08-07 DIAGNOSIS — E785 Hyperlipidemia, unspecified: Secondary | ICD-10-CM

## 2013-08-07 NOTE — Progress Notes (Signed)
[address] [place], [place]  [address] Phone: [phone] Fax:  [phone]  Date:  08/07/2013   ID:  Jamie Heims., DOB 1941/07/04, MRN [id number]  PCP:  Londell Moh, MD  Cardiologist:  Dr. Olga Millers   Electrophysiologist:  Dr. Lewayne Bunting    History of Present Illness: Jamie Hanan Moen. is a 72 y.o. male who returns for surgical clearance.  He has a history of CAD and atrial fibrillation. He underwent CABG (LIMA-LAD, SVG-OM 2, SVG-PDA) along with modified Cox Maze IV procedure in 12/2007.  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 Abdominal US 3/12: No aneurysm. Myoview 12/2011: EF 66%, no scar or ischemia; normal.  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 He underwent craniotomy and evacuation by Dr. Wynetta Emery.  He was taken off of Coumadin and no longer felt to be an anticoagulation candidate.  Patient was evaluated by pulmonology in 05/2013 for dyspnea. Echo 6/14: Mild LVH, EF 55%, diastolic dysfunction, mild LAE, RV dysfunction, PASP 34.  PFTs demonstrated reduced DLCO. Chest CT demonstrated mild pulmonary fibrosis.  It was suspected that this could be from [facility] and this medication was discontinued.    He has a hammertoe and bunion. He plans to have surgery with Dr. Lewie Chamber next week. He tells me that he will have a combination of local anesthetic along with conscious sedation.  He denies chest discomfort. He denies syncope or near-syncope. He denies orthopnea, PND. He has occasional pedal edema, worse in the afternoons. Dyspnea with exertion is fairly stable. He describes NYHA Class II-IIb symptoms. He denies any improvements since stopping Multaq.  Labs (8/13):  K 4.2, Cr 1.06, ALT 17 Labs (6/14):  K 4.4, Cr 1.6, BNP 64, Hgb 13.7  Wt Readings from Last 3 Encounters:  06/12/13 234 lb (106.142 kg)  05/20/13 230 lb 6.4 oz (104.509 kg)  05/15/13 236 lb (107.049 kg)      Past Medical History  Diagnosis Date  . Pacemaker     PPM - St. Jude  . Atrial fibrillation   . CAD (coronary artery disease), native coronary artery   . DM (diabetes mellitus)   . Hyperlipidemia type II   . OSA (obstructive sleep apnea)   . BPH (benign prostatic hyperplasia)   . Subdural hematoma 07/2012    spontaneous;  coumadin d/c'd => no longer a candidate for anticoagulation    Current Outpatient Prescriptions  Medication Sig Dispense Refill  . furosemide (LASIX) 40 MG tablet Take 40 mg by mouth daily.       [name] lisinopril (PRINIVIL,ZESTRIL) 2.5 MG tablet Take 1 tablet by mouth daily.      . metoprolol (LOPRESSOR) 50 MG tablet Take 1 tablet (50 mg total) by mouth 2 (two) times daily.  180 tablet  2  . omega-3 acid ethyl esters (LOVAZA) 1 G capsule Take 2 g by mouth 3 (three) times daily.      [name] oxybutynin (DITROPAN-XL) 10 MG 24 hr tablet daily.      . potassium chloride SA (K-DUR,KLOR-CON) 20 MEQ tablet Take 20 mEq by mouth daily.        . rosuvastatin (CRESTOR) 10 MG tablet Take 10 mg by mouth daily.        . sertraline (ZOLOFT) 100 MG tablet Take 100 mg by mouth daily.      . Tamsulosin HCl (FLOMAX) 0.4 MG CAPS Take by mouth at bedtime.  No current facility-administered medications for this visit.    Allergies:   No Known Allergies  Social History:  The patient  reports that he quit smoking about 22 years ago. He does not have any smokeless tobacco history on file. He reports that he does not drink alcohol or use illicit drugs.   ROS:  Please see the history of present illness.    All other systems reviewed and negative.   PHYSICAL EXAM: VS:  BP 120/80  Pulse 70  Ht 5' 8.5" (1.74 m)  Wt 228 lb (103.42 kg)  BMI 34.16 kg/m2 Well nourished, well developed, in no acute distress HEENT: normal Neck: no JVD Endocrine: No thyromegaly Cardiac:  normal S1, S2; RRR; no murmur Lungs:  clear to auscultation bilaterally, no wheezing, rhonchi or rales Abd: soft,  nontender, no hepatomegaly Ext: trace bilateral LE edema Skin: warm and dry Neuro:  CNs 2-12 intact, no focal abnormalities noted  EKG:  Atrial paced, HR 70, RBBB, no change from prior     ASSESSMENT AND PLAN:  1. Surgical Clearance:  He does not have any unstable cardiac conditions. He had a low risk Myoview 12/2011.  His foot surgery is fairly low risk. He will be placed under conscious sedation. He requires no further cardiac workup prior to his noncardiac surgery. He should be at acceptable risk. Beta blocker should be continued in the perioperative period to reduce the chances of cardiovascular complications. 2. CAD: Continue statin.  Patient is no longer on aspirin due to history of subdural hematoma. I will review this with his neurosurgeon to see if it's possible to restart. 3. Atrial Fibrillation:  Maintaining sinus rhythm. He is now off of Multaq as well as Coumadin. I will review further with Dr. Jens Som and Dr. Ladona Ridgel to determine +/- alternate antiarrhythmic drug therapy. 4. Subdural Hematoma:  As noted, this was spontaneous. He underwent craniotomy with evacuation. He has been off of Coumadin since 07/2012. 5. Dyspnea:  Secondary to pulmonary fibrosis.  Multaq has been d/c'd.  Continue follow up with pulmonology as planned.  6. Hypertension:  Controlled. 7. Hyperlipidemia:  Continue statin. 8. Disposition:  Arrange follow up with Dr. Jens Som in 2-3 months.  Signed, Tereso Newcomer, PA-C  08/07/2013 9:00 AM    Addendum 08/07/2013 5:11 PM:  I reviewed his case with Dr. Wynetta Emery.  He felt the patient could resume ASA from his standpoint.  We will contact the patient and ask him to resume ASA 81 mg QD.   Signed,  Tereso Newcomer, PA-C   08/07/2013 5:11 PM

## 2013-08-07 NOTE — Patient Instructions (Addendum)
NO CHANGES WERE MADE TODAY  Your physician recommends that you schedule a follow-up appointment in: 2-3 MONTHS WITH DR.CRENSHAW

## 2013-08-08 ENCOUNTER — Telehealth: Payer: Self-pay | Admitting: *Deleted

## 2013-08-08 DIAGNOSIS — E7800 Pure hypercholesterolemia, unspecified: Secondary | ICD-10-CM | POA: Diagnosis not present

## 2013-08-08 DIAGNOSIS — R51 Headache: Secondary | ICD-10-CM | POA: Diagnosis not present

## 2013-08-08 MED ORDER — ASPIRIN EC 81 MG PO TBEC: 81.0000 mg | DELAYED_RELEASE_TABLET | Freq: Every day | ORAL | Status: DC

## 2013-08-08 NOTE — Telephone Encounter (Signed)
I now rtnd pt's cb. Pt has been advised per DR. Wynetta Emery and Tereso Newcomer, PAC ok to re-start ASA 81 mg daily after foot surgery and to follow as to when the Poditrist say to re-start ASA 81 mg daily, pt verbalized Plan of Care. I will fax to Dr. Hal Neer

## 2013-08-08 NOTE — Telephone Encounter (Signed)
Follow up   Pt returning a call from [facility]

## 2013-08-08 NOTE — Telephone Encounter (Signed)
I now rtnd pt's cb. Pt has been advised per DR. [name] and Scott Weaver, PAC ok to re-start ASA 81 mg daily after foot surgery and to follow as to when the Poditrist say to re-start ASA 81 mg daily, pt verbalized Plan of Care. I will fax to Dr. Tillis 

## 2013-08-08 NOTE — Telephone Encounter (Signed)
lmptcb go over recommendation ok re-start asa 81 mg , see note from [employer]. PAC

## 2013-08-12 DIAGNOSIS — I69959 Hemiplegia and hemiparesis following unspecified cerebrovascular disease affecting unspecified side: Secondary | ICD-10-CM | POA: Diagnosis not present

## 2013-08-12 DIAGNOSIS — J449 Chronic obstructive pulmonary disease, unspecified: Secondary | ICD-10-CM | POA: Diagnosis not present

## 2013-08-12 DIAGNOSIS — G4733 Obstructive sleep apnea (adult) (pediatric): Secondary | ICD-10-CM | POA: Diagnosis not present

## 2013-08-12 DIAGNOSIS — Z859 Personal history of malignant neoplasm, unspecified: Secondary | ICD-10-CM | POA: Diagnosis not present

## 2013-08-12 DIAGNOSIS — I517 Cardiomegaly: Secondary | ICD-10-CM | POA: Diagnosis not present

## 2013-08-12 DIAGNOSIS — E663 Overweight: Secondary | ICD-10-CM | POA: Diagnosis not present

## 2013-08-12 DIAGNOSIS — I10 Essential (primary) hypertension: Secondary | ICD-10-CM | POA: Diagnosis not present

## 2013-08-12 DIAGNOSIS — Z87891 Personal history of nicotine dependence: Secondary | ICD-10-CM | POA: Diagnosis not present

## 2013-08-12 DIAGNOSIS — Z6834 Body mass index (BMI) 34.0-34.9, adult: Secondary | ICD-10-CM | POA: Diagnosis not present

## 2013-08-12 DIAGNOSIS — Z950 Presence of cardiac pacemaker: Secondary | ICD-10-CM | POA: Diagnosis not present

## 2013-08-12 DIAGNOSIS — I2510 Atherosclerotic heart disease of native coronary artery without angina pectoris: Secondary | ICD-10-CM | POA: Diagnosis not present

## 2013-08-12 DIAGNOSIS — R350 Frequency of micturition: Secondary | ICD-10-CM | POA: Diagnosis not present

## 2013-08-12 DIAGNOSIS — Z9889 Other specified postprocedural states: Secondary | ICD-10-CM | POA: Diagnosis not present

## 2013-08-12 DIAGNOSIS — M21619 Bunion of unspecified foot: Secondary | ICD-10-CM | POA: Diagnosis not present

## 2013-08-12 DIAGNOSIS — Z79899 Other long term (current) drug therapy: Secondary | ICD-10-CM | POA: Diagnosis not present

## 2013-08-12 DIAGNOSIS — M2040 Other hammer toe(s) (acquired), unspecified foot: Secondary | ICD-10-CM | POA: Diagnosis not present

## 2013-08-12 DIAGNOSIS — Z9089 Acquired absence of other organs: Secondary | ICD-10-CM | POA: Diagnosis not present

## 2013-08-12 DIAGNOSIS — J8410 Pulmonary fibrosis, unspecified: Secondary | ICD-10-CM | POA: Diagnosis not present

## 2013-08-12 DIAGNOSIS — E119 Type 2 diabetes mellitus without complications: Secondary | ICD-10-CM | POA: Diagnosis not present

## 2013-08-12 DIAGNOSIS — R0602 Shortness of breath: Secondary | ICD-10-CM | POA: Diagnosis not present

## 2013-08-12 DIAGNOSIS — Z951 Presence of aortocoronary bypass graft: Secondary | ICD-10-CM | POA: Diagnosis not present

## 2013-08-12 DIAGNOSIS — I4891 Unspecified atrial fibrillation: Secondary | ICD-10-CM | POA: Diagnosis not present

## 2013-08-13 DIAGNOSIS — Z951 Presence of aortocoronary bypass graft: Secondary | ICD-10-CM | POA: Diagnosis not present

## 2013-08-13 DIAGNOSIS — IMO0002 Reserved for concepts with insufficient information to code with codable children: Secondary | ICD-10-CM | POA: Diagnosis not present

## 2013-08-13 DIAGNOSIS — M21619 Bunion of unspecified foot: Secondary | ICD-10-CM | POA: Diagnosis not present

## 2013-08-13 DIAGNOSIS — Z9889 Other specified postprocedural states: Secondary | ICD-10-CM | POA: Diagnosis not present

## 2013-08-13 DIAGNOSIS — M2040 Other hammer toe(s) (acquired), unspecified foot: Secondary | ICD-10-CM | POA: Diagnosis not present

## 2013-08-13 DIAGNOSIS — Z9089 Acquired absence of other organs: Secondary | ICD-10-CM | POA: Diagnosis not present

## 2013-08-13 DIAGNOSIS — Z79899 Other long term (current) drug therapy: Secondary | ICD-10-CM | POA: Diagnosis not present

## 2013-09-05 DIAGNOSIS — E7800 Pure hypercholesterolemia, unspecified: Secondary | ICD-10-CM | POA: Diagnosis not present

## 2013-09-05 DIAGNOSIS — Z23 Encounter for immunization: Secondary | ICD-10-CM | POA: Diagnosis not present

## 2013-09-05 DIAGNOSIS — R51 Headache: Secondary | ICD-10-CM | POA: Diagnosis not present

## 2013-09-10 DIAGNOSIS — M2040 Other hammer toe(s) (acquired), unspecified foot: Secondary | ICD-10-CM | POA: Diagnosis not present

## 2013-09-10 DIAGNOSIS — M21619 Bunion of unspecified foot: Secondary | ICD-10-CM | POA: Diagnosis not present

## 2013-10-01 DIAGNOSIS — IMO0002 Reserved for concepts with insufficient information to code with codable children: Secondary | ICD-10-CM | POA: Diagnosis not present

## 2013-10-01 DIAGNOSIS — M1710 Unilateral primary osteoarthritis, unspecified knee: Secondary | ICD-10-CM | POA: Diagnosis not present

## 2013-10-01 DIAGNOSIS — M25569 Pain in unspecified knee: Secondary | ICD-10-CM | POA: Diagnosis not present

## 2013-10-03 DIAGNOSIS — M25469 Effusion, unspecified knee: Secondary | ICD-10-CM | POA: Diagnosis not present

## 2013-10-03 DIAGNOSIS — M25569 Pain in unspecified knee: Secondary | ICD-10-CM | POA: Diagnosis not present

## 2013-10-09 DIAGNOSIS — I4891 Unspecified atrial fibrillation: Secondary | ICD-10-CM | POA: Diagnosis not present

## 2013-10-09 DIAGNOSIS — F3289 Other specified depressive episodes: Secondary | ICD-10-CM | POA: Diagnosis not present

## 2013-10-09 DIAGNOSIS — F329 Major depressive disorder, single episode, unspecified: Secondary | ICD-10-CM | POA: Diagnosis not present

## 2013-10-09 DIAGNOSIS — I259 Chronic ischemic heart disease, unspecified: Secondary | ICD-10-CM | POA: Diagnosis not present

## 2013-10-09 DIAGNOSIS — I10 Essential (primary) hypertension: Secondary | ICD-10-CM | POA: Diagnosis not present

## 2013-10-10 DIAGNOSIS — I4891 Unspecified atrial fibrillation: Secondary | ICD-10-CM | POA: Diagnosis not present

## 2013-10-14 ENCOUNTER — Other Ambulatory Visit: Payer: Self-pay | Admitting: Cardiology

## 2013-10-20 ENCOUNTER — Ambulatory Visit (INDEPENDENT_AMBULATORY_CARE_PROVIDER_SITE_OTHER): Payer: Medicare Other | Admitting: Cardiology

## 2013-10-20 ENCOUNTER — Encounter: Payer: Self-pay | Admitting: Cardiology

## 2013-10-20 VITALS — BP 118/69 | HR 70 | Ht 68.0 in | Wt 229.0 lb

## 2013-10-20 DIAGNOSIS — E785 Hyperlipidemia, unspecified: Secondary | ICD-10-CM

## 2013-10-20 DIAGNOSIS — I4891 Unspecified atrial fibrillation: Secondary | ICD-10-CM

## 2013-10-20 DIAGNOSIS — I2510 Atherosclerotic heart disease of native coronary artery without angina pectoris: Secondary | ICD-10-CM | POA: Diagnosis not present

## 2013-10-20 DIAGNOSIS — I10 Essential (primary) hypertension: Secondary | ICD-10-CM | POA: Diagnosis not present

## 2013-10-20 DIAGNOSIS — Z950 Presence of cardiac pacemaker: Secondary | ICD-10-CM

## 2013-10-20 LAB — BASIC METABOLIC PANEL
BUN: 18 mg/dL (ref 6–23)
CO2: 28 mEq/L (ref 19–32)
Calcium: 8.9 mg/dL (ref 8.4–10.5)
Chloride: 107 mEq/L (ref 96–112)
Creatinine, Ser: 1.2 mg/dL (ref 0.4–1.5)
GFR: 66.67 mL/min (ref >60.00)
Glucose, Bld: 107 mg/dL — ABNORMAL HIGH (ref 70–99)
Potassium: 3.8 mEq/L (ref 3.5–5.1)
Sodium: 140 mEq/L (ref 135–145)

## 2013-10-20 LAB — HEPATIC FUNCTION PANEL
ALT: 24 U/L (ref 0–53)
AST: 22 U/L (ref 0–37)
Albumin: 3.5 g/dL (ref 3.5–5.2)
Alkaline Phosphatase: 72 U/L (ref 39–117)
Bilirubin, Direct: 0 mg/dL (ref 0.0–0.3)
Total Bilirubin: 0.5 mg/dL (ref 0.3–1.2)
Total Protein: 7.1 g/dL (ref 6.0–8.3)

## 2013-10-20 LAB — LIPID PANEL
Cholesterol: 243 mg/dL — ABNORMAL HIGH (ref 0–200)
HDL: 42.8 mg/dL (ref >39.00)
Total CHOL/HDL Ratio: 6
Triglycerides: 406 mg/dL — ABNORMAL HIGH (ref 0.0–149.0)
VLDL: 81.2 mg/dL — ABNORMAL HIGH (ref 0.0–40.0)

## 2013-10-20 LAB — LDL CHOLESTEROL, DIRECT: Direct LDL: 154.2 mg/dL

## 2013-10-20 NOTE — Assessment & Plan Note (Signed)
Patient is describing some dizziness with standing occasionally. I will discontinue his lisinopril. Check potassium and renal function.

## 2013-10-20 NOTE — Assessment & Plan Note (Addendum)
Patient in atrial paced rhythm today. His multaq was discontinued as pulmonary felt it may be causing pulmonary fibrosis and dyspnea. I will not add an additional antiarrhythmic at this point as my hope will be that he will hold sinus rhythm on his own. He is not on anticoagulation given previous spontaneous subdural hematoma. We discussed the risk of an embolic event today. However we have no other choice but to continue off anticoagulation. Continue beta blocker.

## 2013-10-20 NOTE — Assessment & Plan Note (Signed)
Continue statin. 

## 2013-10-20 NOTE — Assessment & Plan Note (Signed)
Management per electrophysiology. 

## 2013-10-20 NOTE — Patient Instructions (Signed)
Your physician has recommended you make the following change in your medication:  1) Stop Lisinopril  Your physician recommends that you return for lab work today: BMET/Lipid/Liver  Your physician wants you to follow-up in: 6 months with Dr. Jens Som.  You will receive a reminder letter in the mail two months in advance. If you don't receive a letter, please call our office to schedule the follow-up appointment.

## 2013-10-20 NOTE — Progress Notes (Signed)
HPI: FU CAD and atrial fibrillation. He underwent CABG (LIMA-LAD, SVG-OM 2, SVG-PDA) along with modified Cox Maze IV procedure in 12/2007. 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 Abdominal US 3/12: No aneurysm. Myoview 12/2011: EF 66%, no scar or ischemia; normal. 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 He underwent craniotomy and evacuation by Dr. Wynetta Emery. He was taken off of Coumadin and no longer felt to be an anticoagulation candidate. Pulmonary eval 6/14 for dyspnea. Echo 6/14: Mild LVH, EF 55%, diastolic dysfunction, mild LAE, RV dysfunction, PASP 34. PFTs demonstrated reduced DLCO. Chest CT demonstrated mild pulmonary fibrosis. It was suspected that this could be from [facility] and this medication was discontinued. There is also a right lower lobe nodule and followup CT was recommended for 3 months. Last seen 8/14. Since then, he has some dyspnea on exertion but no orthopnea, PND, pedal edema, chest pain or syncope.   Current Outpatient Prescriptions  Medication Sig Dispense Refill  . furosemide (LASIX) 40 MG tablet Take 40 mg by mouth daily.       [name] lisinopril (PRINIVIL,ZESTRIL) 2.5 MG tablet Take 1 tablet by mouth daily.      . metoprolol (LOPRESSOR) 50 MG tablet TAKE 1 TABLET TWICE DAILY  180 tablet  0  . omega-3 acid ethyl esters (LOVAZA) 1 G capsule Take 2 g by mouth 3 (three) times daily.      [name] oxybutynin (DITROPAN-XL) 10 MG 24 hr tablet daily.      . potassium chloride SA (K-DUR,KLOR-CON) 20 MEQ tablet Take 20 mEq by mouth daily.        . rosuvastatin (CRESTOR) 10 MG tablet Take 10 mg by mouth daily.        . sertraline (ZOLOFT) 100 MG tablet Take 100 mg by mouth daily.      . Tamsulosin HCl (FLOMAX) 0.4 MG CAPS Take by mouth at bedtime.        No current facility-administered medications for this visit.     Past Medical History  Diagnosis Date  . Pacemaker     PPM - St. Jude  . DM (diabetes mellitus)    . Hyperlipidemia type II   . OSA (obstructive sleep apnea)   . BPH (benign prostatic hyperplasia)   . Subdural hematoma 07/2012    spontaneous;  coumadin d/c'd => no longer a candidate for anticoagulation  . Pulmonary fibrosis     Multaq d/c'd 7/14  . CAD (coronary artery disease), native coronary artery     s/p CABG 12/2007;  Myoview 12/2011: EF 66%, no scar or ischemia; normal.  . Atrial fibrillation     s/p Cox Maze 1/09;  Multaq Rx d/c'd in 2014 due to pulmo fibrosis;  coumadin d/c'd in 2014 due to spontaneous subdural hematoma  . Hypertension     Past Surgical History  Procedure Laterality Date  . Foot surgery    . Cholecystectomy    . Appendectomy    . Orchiectomy      Left  /  testicular cancer  . Hernia repair    . Coronary artery bypass graft      x3 (left internal mammary artery to distal left anterior descending coronary artery, saphenous vain graft to second circumflex marginal branch, saphenous vain graft to posterior descending coronary artery, endoscopic saphenous vain harvest from right thigh) and modified Cox - Maze IV procedure.  [name]. [name],MD. Electronically signed CHO/MEDQ D: 01/09/2008/ JOB: [id number] cc:  Antionette Char  MD  . Pacemaker placement      PPM - St. Jude  . Craniotomy  07/30/2012    Procedure: CRANIOTOMY HEMATOMA EVACUATION SUBDURAL;  Surgeon: Mariam Dollar, MD;  Location: [facility];  Service: Neurosurgery;  Laterality: Right;  Right craniotomy for evacuation of subdural hematoma    History   Social History  . Marital Status: Married    Spouse Name: N/A    Number of Children: N/A  . Years of Education: N/A   Occupational History  . Not on file.   Social History Main Topics  . Smoking status: Former Smoker    Quit date: 02/21/1991  . Smokeless tobacco: Not on file  . Alcohol Use: No  . Drug Use: No  . Sexual Activity: Not Currently   Other Topics Concern  . 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  He  is a Emergency planning/management officer.      ROS: no fevers or chills, productive cough, hemoptysis, dysphasia, odynophagia, melena, hematochezia, dysuria, hematuria, rash, seizure activity, orthopnea, PND, pedal edema, claudication. Remaining systems are negative.  Physical Exam: Well-developed well-nourished in no acute distress.  Skin is warm and dry.  HEENT prior craniotomy Neck is supple.  Chest is clear to auscultation with normal expansion.  Cardiovascular exam is regular rate and rhythm.  Abdominal exam nontender or distended. No masses palpated. Extremities show no edema. neuro grossly intact  ECG atrial paced rhythm, right bundle branch block.

## 2013-10-20 NOTE — Assessment & Plan Note (Signed)
Continue statin. Check lipids and liver. 

## 2013-10-21 ENCOUNTER — Other Ambulatory Visit: Payer: Self-pay | Admitting: Nurse Practitioner

## 2013-10-21 DIAGNOSIS — E785 Hyperlipidemia, unspecified: Secondary | ICD-10-CM

## 2013-10-21 MED ORDER — ROSUVASTATIN CALCIUM 40 MG PO TABS: 40.0000 mg | ORAL_TABLET | Freq: Every day | ORAL | Status: DC

## 2013-11-06 DIAGNOSIS — I259 Chronic ischemic heart disease, unspecified: Secondary | ICD-10-CM | POA: Diagnosis not present

## 2013-11-06 DIAGNOSIS — F329 Major depressive disorder, single episode, unspecified: Secondary | ICD-10-CM | POA: Diagnosis not present

## 2013-11-06 DIAGNOSIS — F3289 Other specified depressive episodes: Secondary | ICD-10-CM | POA: Diagnosis not present

## 2013-11-06 DIAGNOSIS — I10 Essential (primary) hypertension: Secondary | ICD-10-CM | POA: Diagnosis not present

## 2013-11-24 DIAGNOSIS — M2040 Other hammer toe(s) (acquired), unspecified foot: Secondary | ICD-10-CM | POA: Diagnosis not present

## 2013-12-01 ENCOUNTER — Other Ambulatory Visit (INDEPENDENT_AMBULATORY_CARE_PROVIDER_SITE_OTHER): Payer: Medicare Other

## 2013-12-01 DIAGNOSIS — E785 Hyperlipidemia, unspecified: Secondary | ICD-10-CM

## 2013-12-01 LAB — LIPID PANEL
Cholesterol: 160 mg/dL (ref 0–200)
HDL: 38.4 mg/dL — ABNORMAL LOW (ref >39.00)
LDL Cholesterol: 84 mg/dL (ref 0–99)
Total CHOL/HDL Ratio: 4
Triglycerides: 188 mg/dL — ABNORMAL HIGH (ref 0.0–149.0)
VLDL: 37.6 mg/dL (ref 0.0–40.0)

## 2013-12-01 LAB — HEPATIC FUNCTION PANEL
ALT: 18 U/L (ref 0–53)
AST: 24 U/L (ref 0–37)
Albumin: 4 g/dL (ref 3.5–5.2)
Alkaline Phosphatase: 58 U/L (ref 39–117)
Bilirubin, Direct: 0.1 mg/dL (ref 0.0–0.3)
Total Bilirubin: 0.6 mg/dL (ref 0.3–1.2)
Total Protein: 7.2 g/dL (ref 6.0–8.3)

## 2013-12-04 ENCOUNTER — Encounter: Payer: Self-pay | Admitting: *Deleted

## 2013-12-15 ENCOUNTER — Other Ambulatory Visit: Payer: Self-pay | Admitting: Cardiology

## 2014-01-09 DIAGNOSIS — L02619 Cutaneous abscess of unspecified foot: Secondary | ICD-10-CM | POA: Diagnosis not present

## 2014-01-09 DIAGNOSIS — L97509 Non-pressure chronic ulcer of other part of unspecified foot with unspecified severity: Secondary | ICD-10-CM | POA: Diagnosis not present

## 2014-01-09 DIAGNOSIS — L03039 Cellulitis of unspecified toe: Secondary | ICD-10-CM | POA: Diagnosis not present

## 2014-01-09 DIAGNOSIS — M2040 Other hammer toe(s) (acquired), unspecified foot: Secondary | ICD-10-CM | POA: Diagnosis not present

## 2014-01-14 DIAGNOSIS — L97509 Non-pressure chronic ulcer of other part of unspecified foot with unspecified severity: Secondary | ICD-10-CM | POA: Diagnosis not present

## 2014-01-14 DIAGNOSIS — L03039 Cellulitis of unspecified toe: Secondary | ICD-10-CM | POA: Diagnosis not present

## 2014-01-14 DIAGNOSIS — L02619 Cutaneous abscess of unspecified foot: Secondary | ICD-10-CM | POA: Diagnosis not present

## 2014-01-20 DIAGNOSIS — N138 Other obstructive and reflux uropathy: Secondary | ICD-10-CM | POA: Diagnosis not present

## 2014-01-20 DIAGNOSIS — N200 Calculus of kidney: Secondary | ICD-10-CM | POA: Diagnosis not present

## 2014-01-20 DIAGNOSIS — N401 Enlarged prostate with lower urinary tract symptoms: Secondary | ICD-10-CM | POA: Diagnosis not present

## 2014-01-20 DIAGNOSIS — R3915 Urgency of urination: Secondary | ICD-10-CM | POA: Diagnosis not present

## 2014-01-26 DIAGNOSIS — M2040 Other hammer toe(s) (acquired), unspecified foot: Secondary | ICD-10-CM | POA: Diagnosis not present

## 2014-01-26 DIAGNOSIS — L97509 Non-pressure chronic ulcer of other part of unspecified foot with unspecified severity: Secondary | ICD-10-CM | POA: Diagnosis not present

## 2014-01-30 DIAGNOSIS — M19019 Primary osteoarthritis, unspecified shoulder: Secondary | ICD-10-CM | POA: Diagnosis not present

## 2014-01-30 DIAGNOSIS — M67919 Unspecified disorder of synovium and tendon, unspecified shoulder: Secondary | ICD-10-CM | POA: Diagnosis not present

## 2014-01-30 DIAGNOSIS — L570 Actinic keratosis: Secondary | ICD-10-CM | POA: Diagnosis not present

## 2014-02-06 DIAGNOSIS — M2040 Other hammer toe(s) (acquired), unspecified foot: Secondary | ICD-10-CM | POA: Diagnosis not present

## 2014-02-11 ENCOUNTER — Other Ambulatory Visit: Payer: Self-pay | Admitting: Cardiology

## 2014-02-13 DIAGNOSIS — M25519 Pain in unspecified shoulder: Secondary | ICD-10-CM | POA: Diagnosis not present

## 2014-02-16 DIAGNOSIS — Z79899 Other long term (current) drug therapy: Secondary | ICD-10-CM | POA: Diagnosis not present

## 2014-02-16 DIAGNOSIS — I10 Essential (primary) hypertension: Secondary | ICD-10-CM | POA: Diagnosis not present

## 2014-02-16 DIAGNOSIS — E119 Type 2 diabetes mellitus without complications: Secondary | ICD-10-CM | POA: Diagnosis not present

## 2014-02-16 DIAGNOSIS — E7800 Pure hypercholesterolemia, unspecified: Secondary | ICD-10-CM | POA: Diagnosis not present

## 2014-02-16 DIAGNOSIS — Z0000 Encounter for general adult medical examination without abnormal findings: Secondary | ICD-10-CM | POA: Diagnosis not present

## 2014-02-16 DIAGNOSIS — D539 Nutritional anemia, unspecified: Secondary | ICD-10-CM | POA: Diagnosis not present

## 2014-02-16 DIAGNOSIS — Z23 Encounter for immunization: Secondary | ICD-10-CM | POA: Diagnosis not present

## 2014-02-16 DIAGNOSIS — Z125 Encounter for screening for malignant neoplasm of prostate: Secondary | ICD-10-CM | POA: Diagnosis not present

## 2014-02-18 DIAGNOSIS — M2040 Other hammer toe(s) (acquired), unspecified foot: Secondary | ICD-10-CM | POA: Diagnosis not present

## 2014-02-20 DIAGNOSIS — Z9989 Dependence on other enabling machines and devices: Secondary | ICD-10-CM | POA: Diagnosis not present

## 2014-02-20 DIAGNOSIS — G4733 Obstructive sleep apnea (adult) (pediatric): Secondary | ICD-10-CM | POA: Diagnosis not present

## 2014-02-20 DIAGNOSIS — I10 Essential (primary) hypertension: Secondary | ICD-10-CM | POA: Diagnosis not present

## 2014-02-20 DIAGNOSIS — I2510 Atherosclerotic heart disease of native coronary artery without angina pectoris: Secondary | ICD-10-CM | POA: Diagnosis not present

## 2014-02-24 DIAGNOSIS — R7309 Other abnormal glucose: Secondary | ICD-10-CM | POA: Diagnosis not present

## 2014-02-24 DIAGNOSIS — E785 Hyperlipidemia, unspecified: Secondary | ICD-10-CM | POA: Diagnosis not present

## 2014-02-24 DIAGNOSIS — M2040 Other hammer toe(s) (acquired), unspecified foot: Secondary | ICD-10-CM | POA: Diagnosis not present

## 2014-02-24 DIAGNOSIS — E669 Obesity, unspecified: Secondary | ICD-10-CM | POA: Diagnosis not present

## 2014-02-24 DIAGNOSIS — I10 Essential (primary) hypertension: Secondary | ICD-10-CM | POA: Diagnosis not present

## 2014-02-24 DIAGNOSIS — Z79899 Other long term (current) drug therapy: Secondary | ICD-10-CM | POA: Diagnosis not present

## 2014-02-24 DIAGNOSIS — G4733 Obstructive sleep apnea (adult) (pediatric): Secondary | ICD-10-CM | POA: Diagnosis not present

## 2014-02-24 DIAGNOSIS — Z87891 Personal history of nicotine dependence: Secondary | ICD-10-CM | POA: Diagnosis not present

## 2014-02-25 DIAGNOSIS — Z9889 Other specified postprocedural states: Secondary | ICD-10-CM | POA: Diagnosis not present

## 2014-02-25 DIAGNOSIS — E669 Obesity, unspecified: Secondary | ICD-10-CM | POA: Diagnosis not present

## 2014-02-25 DIAGNOSIS — E785 Hyperlipidemia, unspecified: Secondary | ICD-10-CM | POA: Diagnosis not present

## 2014-02-25 DIAGNOSIS — M2040 Other hammer toe(s) (acquired), unspecified foot: Secondary | ICD-10-CM | POA: Diagnosis not present

## 2014-02-25 DIAGNOSIS — Z1212 Encounter for screening for malignant neoplasm of rectum: Secondary | ICD-10-CM | POA: Diagnosis not present

## 2014-02-25 DIAGNOSIS — G4733 Obstructive sleep apnea (adult) (pediatric): Secondary | ICD-10-CM | POA: Diagnosis not present

## 2014-02-25 DIAGNOSIS — R7309 Other abnormal glucose: Secondary | ICD-10-CM | POA: Diagnosis not present

## 2014-02-25 DIAGNOSIS — I10 Essential (primary) hypertension: Secondary | ICD-10-CM | POA: Diagnosis not present

## 2014-03-02 DIAGNOSIS — M2040 Other hammer toe(s) (acquired), unspecified foot: Secondary | ICD-10-CM | POA: Diagnosis not present

## 2014-03-06 DIAGNOSIS — Z9989 Dependence on other enabling machines and devices: Secondary | ICD-10-CM | POA: Diagnosis not present

## 2014-03-06 DIAGNOSIS — E669 Obesity, unspecified: Secondary | ICD-10-CM | POA: Diagnosis not present

## 2014-03-06 DIAGNOSIS — G4733 Obstructive sleep apnea (adult) (pediatric): Secondary | ICD-10-CM | POA: Diagnosis not present

## 2014-03-06 DIAGNOSIS — I10 Essential (primary) hypertension: Secondary | ICD-10-CM | POA: Diagnosis not present

## 2014-04-02 ENCOUNTER — Other Ambulatory Visit: Payer: Self-pay | Admitting: Cardiology

## 2014-04-23 ENCOUNTER — Encounter: Payer: Self-pay | Admitting: Cardiology

## 2014-04-23 ENCOUNTER — Ambulatory Visit (INDEPENDENT_AMBULATORY_CARE_PROVIDER_SITE_OTHER): Payer: Medicare Other | Admitting: Cardiology

## 2014-04-23 VITALS — BP 124/72 | HR 69 | Ht 68.0 in | Wt 227.0 lb

## 2014-04-23 DIAGNOSIS — I2510 Atherosclerotic heart disease of native coronary artery without angina pectoris: Secondary | ICD-10-CM

## 2014-04-23 DIAGNOSIS — I4891 Unspecified atrial fibrillation: Secondary | ICD-10-CM | POA: Diagnosis not present

## 2014-04-23 DIAGNOSIS — R911 Solitary pulmonary nodule: Secondary | ICD-10-CM | POA: Diagnosis not present

## 2014-04-23 LAB — BASIC METABOLIC PANEL
BUN: 16 mg/dL (ref 6–23)
CO2: 29 mEq/L (ref 19–32)
Calcium: 9.2 mg/dL (ref 8.4–10.5)
Chloride: 106 mEq/L (ref 96–112)
Creatinine, Ser: 1.3 mg/dL (ref 0.4–1.5)
GFR: 59.91 mL/min — ABNORMAL LOW (ref >60.00)
Glucose, Bld: 115 mg/dL — ABNORMAL HIGH (ref 70–99)
Potassium: 3.7 mEq/L (ref 3.5–5.1)
Sodium: 140 mEq/L (ref 135–145)

## 2014-04-23 NOTE — Assessment & Plan Note (Signed)
Blood pressure controlled. Continue present medications. 

## 2014-04-23 NOTE — Patient Instructions (Addendum)
Your physician wants you to follow-up in: [place] will receive a reminder letter in the mail two months in advance. If you don't receive a letter, please call our office to schedule the follow-up appointment.   Non-Cardiac CT scanning, (CAT scanning), is a noninvasive, special x-ray that produces cross-sectional images of the body using x-rays and a computer. CT scans help physicians diagnose and treat medical conditions. For some CT exams, a contrast material is used to enhance visibility in the area of the body being studied. CT scans provide greater clarity and reveal more details than regular x-ray exams. WITHOUT CONTRAST TO F/U LUNG NODULE   Your physician recommends that you HAVE LAB WORK TODAY

## 2014-04-23 NOTE — Assessment & Plan Note (Signed)
Repeat chest CT

## 2014-04-23 NOTE — Assessment & Plan Note (Signed)
Management per electrophysiology. 

## 2014-04-23 NOTE — Progress Notes (Signed)
HPI: FU CAD and atrial fibrillation. He underwent CABG (LIMA-LAD, SVG-OM 2, SVG-PDA) along with modified Cox Maze IV procedure in 12/2007. 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 Abdominal US 3/12: No aneurysm. Myoview 12/2011: EF 66%, no scar or ischemia; normal. 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 He underwent craniotomy and evacuation by Dr. Saintclair Halsted. He was taken off of Coumadin and no longer felt to be an anticoagulation candidate. Pulmonary eval 6/14 for dyspnea. Echo 6/14: Mild LVH, EF 60%, diastolic dysfunction, mild LAE, RV dysfunction, PASP 34. PFTs demonstrated reduced DLCO. Chest CT demonstrated mild pulmonary fibrosis. It was suspected that this could be from [facility] and this medication was discontinued. There is also a right lower lobe nodule and followup CT was recommended for 3 months. Since last seen, Patient has some dyspnea on exertion which is unchanged. No orthopnea, PND, pedal edema, syncope or chest pain.  Current Outpatient Prescriptions  Medication Sig Dispense Refill  . furosemide (LASIX) 40 MG tablet Take 40 mg by mouth daily.       . metoprolol (LOPRESSOR) 50 MG tablet TAKE 1 TABLET TWICE DAILY  180 tablet  0  . omega-3 acid ethyl esters (LOVAZA) 1 G capsule Take 2 g by mouth 3 (three) times daily.      [name] oxybutynin (DITROPAN-XL) 10 MG 24 hr tablet daily.      . potassium chloride SA (K-DUR,KLOR-CON) 20 MEQ tablet Take 20 mEq by mouth daily.        . rosuvastatin (CRESTOR) 40 MG tablet Take 1 tablet (40 mg total) by mouth daily.  30 tablet  6  . sertraline (ZOLOFT) 100 MG tablet Take 100 mg by mouth daily.      . Tamsulosin HCl (FLOMAX) 0.4 MG CAPS Take by mouth at bedtime.        No current facility-administered medications for this visit.     Past Medical History  Diagnosis Date  . Pacemaker     PPM - St. Jude  . DM (diabetes mellitus)   . Hyperlipidemia type II   . OSA (obstructive  sleep apnea)   . BPH (benign prostatic hyperplasia)   . Subdural hematoma 07/2012    spontaneous;  coumadin d/c'd => no longer a candidate for anticoagulation  . Pulmonary fibrosis     Multaq d/c'd 7/14  . CAD (coronary artery disease), native coronary artery     s/p CABG 12/2007;  Myoview 12/2011: EF 66%, no scar or ischemia; normal.  . Atrial fibrillation     s/p Cox Maze 1/09;  Multaq Rx d/c'd in 2014 due to pulmo fibrosis;  coumadin d/c'd in 2014 due to spontaneous subdural hematoma  . Hypertension     Past Surgical History  Procedure Laterality Date  . Foot surgery    . Cholecystectomy    . Appendectomy    . Orchiectomy      Left  /  testicular cancer  . Hernia repair    . Coronary artery bypass graft      x3 (left internal mammary artery to distal left anterior descending coronary artery, saphenous vain graft to second circumflex marginal branch, saphenous vain graft to posterior descending coronary artery, endoscopic saphenous vain harvest from right thigh) and modified Cox - Maze IV procedure.  [name],MD. Electronically signed CHO/MEDQ D: 01/09/2008/ JOB: [id number] cc:  [name]  . Pacemaker placement      PPM - St. Jude  .  Craniotomy  07/30/2012    Procedure: CRANIOTOMY HEMATOMA EVACUATION SUBDURAL;  Surgeon: [name], MD;  Location: [place];  Service: Neurosurgery;  Laterality: Right;  Right craniotomy for evacuation of subdural hematoma    History   Social History  . Marital Status: Married    Spouse Name: N/A    Number of Children: N/A  . Years of Education: N/A   Occupational History  . Not on file.   Social History Main Topics  . Smoking status: Former Smoker    Quit date: 02/21/1991  . Smokeless tobacco: Not on file  . Alcohol Use: No  . Drug Use: No  . Sexual Activity: Not Currently   Other Topics Concern  . 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  He is a Engineer, structural.      ROS: no fevers or  chills, productive cough, hemoptysis, dysphasia, odynophagia, melena, hematochezia, dysuria, hematuria, rash, seizure activity, orthopnea, PND, pedal edema, claudication. Remaining systems are negative.  Physical Exam: Well-developed well-nourished in no acute distress.  Skin is warm and dry.  HEENT is normal.  Neck is supple.  Chest is clear to auscultation with normal expansion.  Cardiovascular exam is regular rate and rhythm.  Abdominal exam nontender or distended. No masses palpated. Extremities show no edema. neuro grossly intact  ECG Probable sinus rhythm with right bundle branch block.

## 2014-04-23 NOTE — Assessment & Plan Note (Signed)
Continue statin. 

## 2014-04-23 NOTE — Assessment & Plan Note (Signed)
Patient in probable sinus today. His multaq was discontinued as pulmonary felt it may be causing pulmonary fibrosis and dyspnea. I will not add an additional antiarrhythmic at this point as my hope will be that he will hold sinus rhythm on his own. He is not on anticoagulation given previous spontaneous subdural hematoma. We discussed the risk of an embolic event today. However we have no other choice but to continue off anticoagulation. Continue beta blocker. 

## 2014-04-30 ENCOUNTER — Ambulatory Visit (INDEPENDENT_AMBULATORY_CARE_PROVIDER_SITE_OTHER)
Admission: RE | Admit: 2014-04-30 | Discharge: 2014-04-30 | Disposition: A | Discharge status: Home / Self Care | Payer: Medicare Other | Source: Ambulatory Visit | Attending: Cardiology | Admitting: Cardiology

## 2014-04-30 DIAGNOSIS — H43819 Vitreous degeneration, unspecified eye: Secondary | ICD-10-CM | POA: Diagnosis not present

## 2014-04-30 DIAGNOSIS — H25019 Cortical age-related cataract, unspecified eye: Secondary | ICD-10-CM | POA: Diagnosis not present

## 2014-04-30 DIAGNOSIS — R911 Solitary pulmonary nodule: Secondary | ICD-10-CM | POA: Diagnosis not present

## 2014-05-05 ENCOUNTER — Telehealth: Payer: Self-pay | Admitting: Cardiology

## 2014-05-05 NOTE — Telephone Encounter (Signed)
24 months [name]

## 2014-05-05 NOTE — Telephone Encounter (Signed)
Returned call to patient 05/01/14 chest ct results given.Patient wants to know if he needs to repeat chest ct in 12 months and not 24 months.Also he is still having sob.Message sent to [facility] for advice.

## 2014-05-05 NOTE — Telephone Encounter (Signed)
New message  ° ° °Returning call back to nurse.  °

## 2014-05-06 ENCOUNTER — Encounter: Payer: Self-pay | Admitting: Cardiology

## 2014-05-06 NOTE — Telephone Encounter (Signed)
Left message for pt to call.

## 2014-05-06 NOTE — Telephone Encounter (Signed)
Spoke with pt, aware will contact him in two years regarding repeating scan. It appears the nodule we are watching has gotten smaller. Pt agreed with this plan.

## 2014-05-06 NOTE — Telephone Encounter (Signed)
New  Message ° °Pt returned call  °

## 2014-05-06 NOTE — Telephone Encounter (Signed)
°  Patient is returning your call. Please call and advise.  °

## 2014-05-06 NOTE — Telephone Encounter (Signed)
This encounter was created in error - please disregard.

## 2014-05-14 ENCOUNTER — Other Ambulatory Visit: Payer: Self-pay

## 2014-05-19 ENCOUNTER — Encounter: Payer: Self-pay | Admitting: Internal Medicine

## 2014-05-19 ENCOUNTER — Ambulatory Visit (INDEPENDENT_AMBULATORY_CARE_PROVIDER_SITE_OTHER): Payer: Medicare Other | Admitting: Internal Medicine

## 2014-05-19 VITALS — BP 132/70 | HR 63 | Ht 68.5 in | Wt 223.0 lb

## 2014-05-19 DIAGNOSIS — I2510 Atherosclerotic heart disease of native coronary artery without angina pectoris: Secondary | ICD-10-CM | POA: Diagnosis not present

## 2014-05-19 DIAGNOSIS — I4891 Unspecified atrial fibrillation: Secondary | ICD-10-CM

## 2014-05-19 DIAGNOSIS — I10 Essential (primary) hypertension: Secondary | ICD-10-CM | POA: Diagnosis not present

## 2014-05-19 DIAGNOSIS — Z950 Presence of cardiac pacemaker: Secondary | ICD-10-CM | POA: Diagnosis not present

## 2014-05-19 DIAGNOSIS — I499 Cardiac arrhythmia, unspecified: Secondary | ICD-10-CM

## 2014-05-19 LAB — MDC_IDC_ENUM_SESS_TYPE_INCLINIC
Battery Impedance: 1400 Ohm
Battery Voltage: 2.75 V
Brady Statistic RA Percent Paced: 94 %
Brady Statistic RV Percent Paced: 4.7 %
Date Time Interrogation Session: 20150602091845
Implantable Pulse Generator Model: 5826
Implantable Pulse Generator Serial Number: 2109558
Lead Channel Impedance Value: 490 Ohm
Lead Channel Impedance Value: 603 Ohm
Lead Channel Pacing Threshold Amplitude: 0.625 V
Lead Channel Pacing Threshold Amplitude: 1 V
Lead Channel Pacing Threshold Pulse Width: 0.5 ms
Lead Channel Pacing Threshold Pulse Width: 0.5 ms
Lead Channel Sensing Intrinsic Amplitude: 1.9 mV
Lead Channel Sensing Intrinsic Amplitude: 9.5 mV
Lead Channel Setting Pacing Amplitude: 2 V
Lead Channel Setting Pacing Pulse Width: 0.5 ms
Lead Channel Setting Sensing Sensitivity: 2 mV

## 2014-05-19 NOTE — Assessment & Plan Note (Signed)
His blood pressure is well controlled. No change in medical therapy. 

## 2014-05-19 NOTE — Patient Instructions (Signed)
Your physician wants you to follow-up in: 6 months with device clinic and 12 months with Dr Taylor You will receive a reminder letter in the mail two months in advance. If you don't receive a letter, please call our office to schedule the follow-up appointment.  

## 2014-05-19 NOTE — Assessment & Plan Note (Signed)
St. Jude PPM is working normally. Will recheck in several months. He has approx 5 years of battery longevity.

## 2014-05-19 NOTE — Progress Notes (Signed)
HPI Mr. Polyak returns today for followup. He is a pleasant 73 yo man with a h/o HTN, symptomatic bradycardia due to sinus node dysfunction. He is s/p PPM insertion. He denies chest pain or sob at rest. He does note dyspnea with exertion, especially with going up stairs. He has had mild peripheral edema. Since I saw him last, he developed a sub-dural hematoma and is s/p evacuation. He is no longer on any anti-coagulation. No Known Allergies   Current Outpatient Prescriptions  Medication Sig Dispense Refill  . furosemide (LASIX) 40 MG tablet Take 40 mg by mouth daily.       [name] lisinopril (PRINIVIL,ZESTRIL) 2.5 MG tablet Take 1 tablet by mouth daily.      . metoprolol (LOPRESSOR) 50 MG tablet TAKE 1 TABLET TWICE DAILY  180 tablet  0  . omega-3 acid ethyl esters (LOVAZA) 1 G capsule Take 2 g by mouth 3 (three) times daily.      [name] oxybutynin (DITROPAN-XL) 10 MG 24 hr tablet Take 10 mg by mouth daily.       . potassium chloride SA (K-DUR,KLOR-CON) 20 MEQ tablet Take 20 mEq by mouth daily.        . rosuvastatin (CRESTOR) 40 MG tablet Take 1 tablet (40 mg total) by mouth daily.  30 tablet  6  . sertraline (ZOLOFT) 100 MG tablet Take 100 mg by mouth daily.      . Tamsulosin HCl (FLOMAX) 0.4 MG CAPS Take 0.4 mg by mouth at bedtime.        No current facility-administered medications for this visit.     Past Medical History  Diagnosis Date  . Pacemaker     PPM - St. Jude  . DM (diabetes mellitus)   . Hyperlipidemia type II   . OSA (obstructive sleep apnea)   . BPH (benign prostatic hyperplasia)   . Subdural hematoma 07/2012    spontaneous;  coumadin d/c'd => no longer a candidate for anticoagulation  . Pulmonary fibrosis     Multaq d/c'd 7/14  . CAD (coronary artery disease), native coronary artery     s/p CABG 12/2007;  Myoview 12/2011: EF 66%, no scar or ischemia; normal.  . Atrial fibrillation     s/p Cox Maze 1/09;  Multaq Rx d/c'd in 2014 due to pulmo fibrosis;  coumadin d/c'd in 2014 due  to spontaneous subdural hematoma  . Hypertension     ROS:   All systems reviewed and negative except as noted in the HPI.   Past Surgical History  Procedure Laterality Date  . Foot surgery    . Cholecystectomy    . Appendectomy    . Orchiectomy      Left  /  testicular cancer  . Hernia repair    . Coronary artery bypass graft      x3 (left internal mammary artery to distal left anterior descending coronary artery, saphenous vain graft to second circumflex marginal branch, saphenous vain graft to posterior descending coronary artery, endoscopic saphenous vain harvest from right thigh) and modified Cox - Maze IV procedure.  [name],MD. Electronically signed CHO/MEDQ D: 01/09/2008/ JOB: [id number] cc:  [name]  . Pacemaker placement      PPM - St. Jude  . Craniotomy  07/30/2012    Procedure: CRANIOTOMY HEMATOMA EVACUATION SUBDURAL;  Surgeon: [name], MD;  Location: [place] [facility];  Service: Neurosurgery;  Laterality: Right;  Right craniotomy for evacuation of subdural hematoma     Family  History  Problem Relation Age of Onset  . Heart disease Father   . Heart attack Father   . Heart failure Father   . Heart disease Mother   . Alzheimer's disease Mother      History   Social History  . Marital Status: Married    Spouse Name: N/A    Number of Children: N/A  . Years of Education: N/A   Occupational History  . Not on file.   Social History Main Topics  . Smoking status: Former Smoker    Quit date: 02/21/1991  . Smokeless tobacco: Not on file  . Alcohol Use: No  . Drug Use: No  . Sexual Activity: Not Currently   Other Topics Concern  . 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  He is a Engineer, structural.       BP 132/70  Pulse 63  Ht 5' 8.5" (1.74 m)  Wt 223 lb (101.152 kg)  BMI 33.41 kg/m2  Physical Exam:  Well appearing, obese, 73 year old man, NAD HEENT: Unremarkable Neck:  7 cm JVD, no thyromegally Back:  No  CVA tenderness Lungs:  Clear with no wheezes, rales, or rhonchi. HEART:  Regular rate rhythm, no murmurs, no rubs, no clicks Abd:  soft, obese, positive bowel sounds, no organomegally, no rebound, no guarding Ext:  2 plus pulses, no edema, no cyanosis, no clubbing Skin:  No rashes no nodules Neuro:  CN II through XII intact, motor grossly intact  DEVICE  Normal device function.  See PaceArt for details.   Assess/Plan:

## 2014-05-19 NOTE — Assessment & Plan Note (Signed)
He is s/p MAZE and is maintaining NSR approx. 99.9% of the time.

## 2014-06-09 ENCOUNTER — Other Ambulatory Visit: Payer: Self-pay | Admitting: *Deleted

## 2014-06-09 DIAGNOSIS — E785 Hyperlipidemia, unspecified: Secondary | ICD-10-CM

## 2014-06-09 MED ORDER — ROSUVASTATIN CALCIUM 40 MG PO TABS: 40.0000 mg | ORAL_TABLET | Freq: Every day | ORAL | Status: DC

## 2014-06-10 ENCOUNTER — Other Ambulatory Visit: Payer: Self-pay | Admitting: Cardiology

## 2014-06-11 DIAGNOSIS — M109 Gout, unspecified: Secondary | ICD-10-CM | POA: Diagnosis not present

## 2014-06-30 DIAGNOSIS — D649 Anemia, unspecified: Secondary | ICD-10-CM | POA: Diagnosis not present

## 2014-06-30 DIAGNOSIS — E119 Type 2 diabetes mellitus without complications: Secondary | ICD-10-CM | POA: Diagnosis not present

## 2014-07-01 DIAGNOSIS — M2040 Other hammer toe(s) (acquired), unspecified foot: Secondary | ICD-10-CM | POA: Diagnosis not present

## 2014-07-02 DIAGNOSIS — E119 Type 2 diabetes mellitus without complications: Secondary | ICD-10-CM | POA: Diagnosis not present

## 2014-07-02 DIAGNOSIS — L570 Actinic keratosis: Secondary | ICD-10-CM | POA: Diagnosis not present

## 2014-07-02 DIAGNOSIS — I10 Essential (primary) hypertension: Secondary | ICD-10-CM | POA: Diagnosis not present

## 2014-08-14 ENCOUNTER — Emergency Department (HOSPITAL_COMMUNITY)
Admission: EM | Admit: 2014-08-14 | Discharge: 2014-08-14 | Disposition: A | Discharge status: Home / Self Care | Payer: Medicare Other | Attending: Emergency Medicine | Admitting: Emergency Medicine

## 2014-08-14 ENCOUNTER — Encounter (HOSPITAL_COMMUNITY): Payer: Self-pay | Admitting: Emergency Medicine

## 2014-08-14 DIAGNOSIS — Z951 Presence of aortocoronary bypass graft: Secondary | ICD-10-CM | POA: Insufficient documentation

## 2014-08-14 DIAGNOSIS — Z79899 Other long term (current) drug therapy: Secondary | ICD-10-CM | POA: Insufficient documentation

## 2014-08-14 DIAGNOSIS — N400 Enlarged prostate without lower urinary tract symptoms: Secondary | ICD-10-CM | POA: Diagnosis not present

## 2014-08-14 DIAGNOSIS — Z8709 Personal history of other diseases of the respiratory system: Secondary | ICD-10-CM | POA: Diagnosis not present

## 2014-08-14 DIAGNOSIS — E119 Type 2 diabetes mellitus without complications: Secondary | ICD-10-CM | POA: Diagnosis not present

## 2014-08-14 DIAGNOSIS — I10 Essential (primary) hypertension: Secondary | ICD-10-CM | POA: Diagnosis not present

## 2014-08-14 DIAGNOSIS — I4891 Unspecified atrial fibrillation: Secondary | ICD-10-CM | POA: Diagnosis not present

## 2014-08-14 DIAGNOSIS — S31119A Laceration without foreign body of abdominal wall, unspecified quadrant without penetration into peritoneal cavity, initial encounter: Secondary | ICD-10-CM

## 2014-08-14 DIAGNOSIS — S31109A Unspecified open wound of abdominal wall, unspecified quadrant without penetration into peritoneal cavity, initial encounter: Secondary | ICD-10-CM | POA: Insufficient documentation

## 2014-08-14 DIAGNOSIS — Z23 Encounter for immunization: Secondary | ICD-10-CM | POA: Diagnosis not present

## 2014-08-14 DIAGNOSIS — Z87891 Personal history of nicotine dependence: Secondary | ICD-10-CM | POA: Diagnosis not present

## 2014-08-14 DIAGNOSIS — Y92009 Unspecified place in unspecified non-institutional (private) residence as the place of occurrence of the external cause: Secondary | ICD-10-CM | POA: Diagnosis not present

## 2014-08-14 DIAGNOSIS — W278XXA Contact with other nonpowered hand tool, initial encounter: Secondary | ICD-10-CM | POA: Diagnosis not present

## 2014-08-14 DIAGNOSIS — Y9389 Activity, other specified: Secondary | ICD-10-CM | POA: Insufficient documentation

## 2014-08-14 DIAGNOSIS — Z8669 Personal history of other diseases of the nervous system and sense organs: Secondary | ICD-10-CM | POA: Diagnosis not present

## 2014-08-14 DIAGNOSIS — E7800 Pure hypercholesterolemia, unspecified: Secondary | ICD-10-CM | POA: Diagnosis not present

## 2014-08-14 DIAGNOSIS — I2510 Atherosclerotic heart disease of native coronary artery without angina pectoris: Secondary | ICD-10-CM | POA: Insufficient documentation

## 2014-08-14 MED ORDER — TETANUS-DIPHTH-ACELL PERTUSSIS 5-2.5-18.5 LF-MCG/0.5 IM SUSP
0.5000 mL | Freq: Once | INTRAMUSCULAR | Status: AC
  Administered 2014-08-14: 0.5 mL via INTRAMUSCULAR
  Filled 2014-08-14: qty 0.5

## 2014-08-14 NOTE — Discharge Instructions (Signed)

## 2014-08-14 NOTE — ED Notes (Signed)
Pt from home laceration to upper abdomen.  Pt was using a box cutter/razor blade when he felt the blade slip between his hands cutting him in his abdomen.  Bleeding controlled at triage.  Pt not on any blood thinners. Nad.  Incident occurred about 45 minutes prior to arriving to department.

## 2014-08-14 NOTE — ED Notes (Signed)
Laceration to abdomen. Shanon Brow, NP at bedside to suture.

## 2014-08-14 NOTE — ED Provider Notes (Signed)
CSN: [id number]     Arrival date & time 08/14/14  1950 History  This chart was scribed for non-physician practitioner working with Orpah Greek, * by Mercy Moore, ED Scribe. This patient was seen in room TR10C/TR10C and the patient's care was started at 9:41 PM.   Chief Complaint  Patient presents with  . Laceration   The history is provided by the patient. No language interpreter was used.   HPI Comments: [name]. is a 73 y.o. male who presents to the Emergency Department with right, upper abdominal laceration sustained tonight. Patient states that he was using a box cutter, carelessly cutting toward himself when the blade slipped. Patient presents with small laceration to his right abdomen. Bleeding controlled. Patient denies any abdominal pain.  Patient denies history of Diabetes. Patient is unsure of last Tetanus and is due for an update.    Past Medical History  Diagnosis Date  . Pacemaker     PPM - St. Jude  . DM (diabetes mellitus)   . Hyperlipidemia type II   . OSA (obstructive sleep apnea)   . BPH (benign prostatic hyperplasia)   . Subdural hematoma 07/2012    spontaneous;  coumadin d/c'd => no longer a candidate for anticoagulation  . Pulmonary fibrosis     Multaq d/c'd 7/14  . CAD (coronary artery disease), native coronary artery     s/p CABG 12/2007;  Myoview 12/2011: EF 66%, no scar or ischemia; normal.  . Atrial fibrillation     s/p Cox Maze 1/09;  Multaq Rx d/c'd in 2014 due to pulmo fibrosis;  coumadin d/c'd in 2014 due to spontaneous subdural hematoma  . Hypertension    Past Surgical History  Procedure Laterality Date  . Foot surgery    . Cholecystectomy    . Appendectomy    . Orchiectomy      Left  /  testicular cancer  . Hernia repair    . Coronary artery bypass graft      x3 (left internal mammary artery to distal left anterior descending coronary artery, saphenous vain graft to second circumflex marginal branch, saphenous vain graft to  posterior descending coronary artery, endoscopic saphenous vain harvest from right thigh) and modified Cox - Maze IV procedure.  [name],MD. Electronically signed CHO/MEDQ D: 01/09/2008/ JOB: [id number] cc:  [name]  . Pacemaker placement      PPM - St. Jude  . Craniotomy  07/30/2012    Procedure: CRANIOTOMY HEMATOMA EVACUATION SUBDURAL;  Surgeon: [name], MD;  Location: [facility];  Service: Neurosurgery;  Laterality: Right;  Right craniotomy for evacuation of subdural hematoma   Family History  Problem Relation Age of Onset  . Heart disease Father   . Heart attack Father   . Heart failure Father   . Heart disease Mother   . Alzheimer's disease Mother    History  Substance Use Topics  . Smoking status: Former Smoker    Quit date: 02/21/1991  . Smokeless tobacco: Not on file  . Alcohol Use: No    Review of Systems  Gastrointestinal: Negative for abdominal pain.  Skin:       Laceration   All other systems reviewed and are negative.  Allergies  Review of patient's allergies indicates no known allergies.  Home Medications   Prior to Admission medications   Medication Sig Start Date End Date Taking? Authorizing Provider  furosemide (LASIX) 40 MG tablet Take 40 mg by mouth daily.  Historical Provider, MD  lisinopril (PRINIVIL,ZESTRIL) 2.5 MG tablet Take 1 tablet by mouth daily. 05/03/14   Historical Provider, MD  metoprolol (LOPRESSOR) 50 MG tablet TAKE 1 TABLET TWICE DAILY    [name], MD  omega-3 acid ethyl esters (LOVAZA) 1 G capsule Take 2 g by mouth 3 (three) times daily.    Historical Provider, MD  oxybutynin (DITROPAN-XL) 10 MG 24 hr tablet Take 10 mg by mouth daily.  04/07/13   Historical Provider, MD  potassium chloride SA (K-DUR,KLOR-CON) 20 MEQ tablet Take 20 mEq by mouth daily.      Historical Provider, MD  rosuvastatin (CRESTOR) 40 MG tablet Take 1 tablet (40 mg total) by mouth daily. 06/09/14   [name], MD  sertraline (ZOLOFT)  100 MG tablet Take 100 mg by mouth daily.    Historical Provider, MD  Tamsulosin HCl (FLOMAX) 0.4 MG CAPS Take 0.4 mg by mouth at bedtime.     Historical Provider, MD   Triage Vitals: BP 119/65  Pulse 70  Temp(Src) 98.6 F (37 C) (Oral)  Resp 20  SpO2 99%  Physical Exam  Nursing note and vitals reviewed. Constitutional: He is oriented to person, place, and time. He appears well-developed and well-nourished.  HENT:  Head: Normocephalic and atraumatic.  Eyes: EOM are normal.  Neck: Neck supple.  Cardiovascular: Normal rate.   Pulmonary/Chest: Effort normal.  Musculoskeletal: Normal range of motion.  Neurological: He is alert and oriented to person, place, and time.  Skin: Skin is warm and dry.  4cm laceration to upper right abdominal.   Psychiatric: He has a normal mood and affect. His behavior is normal.    ED Course  Procedures (including critical care time)  LACERATION REPAIR Performed by: Etta Quill Consent: Verbal consent obtained. Risks and benefits: risks, benefits and alternatives were discussed Patient identity confirmed: provided demographic data Time out performed prior to procedure Prepped and Draped in normal sterile fashion Wound explored Laceration Location: right upper adbomen Laceration Length: 4cm No Foreign Bodies seen or palpated Anesthesia: local infiltration Local anesthetic: lidocaine 2% without epinephrine Anesthetic total: 2 ml Irrigation method: syringe Amount of cleaning: standard Skin closure: staples  Number of sutures or staples: 4 Patient tolerance: Patient tolerated the procedure well with no immediate complications.   COORDINATION OF CARE: 9:48 PM- Plans to staple laceration. Discussed treatment plan with patient at bedside and patient agreed to plan.   Labs Review Labs Reviewed - No data to display  Imaging Review No results found.   EKG Interpretation None     Stapled wound closure.  Tetanus updated.  Patient discussed  with and seen by Dr. Betsey Holiday.  Staple removal in 7-10 days. Return precautions discussed. MDM   Final diagnoses:  None    Superficial laceration to right upper abdominal wall.    I personally performed the services described in this documentation, which was scribed in my presence. The recorded information has been reviewed and is accurate.    Norman Herrlich, NP 08/14/14 [phone]

## 2014-08-15 NOTE — ED Provider Notes (Signed)
Medical screening examination/treatment/procedure(s) were conducted as a shared visit with non-physician practitioner(s) and myself.  I personally evaluated the patient during the encounter.   EKG Interpretation None      Isn't present in the superficial laceration across his upper abdominal wall suffered just prior to arrival. Patient accidentally cut himself with a box cutter. Staples were placed by a local provider, followup with primary doctor for staple removal.  Orpah Greek, MD 08/15/14 [phone]

## 2014-08-21 DIAGNOSIS — Z4802 Encounter for removal of sutures: Secondary | ICD-10-CM | POA: Diagnosis not present

## 2014-10-02 DIAGNOSIS — E780 Pure hypercholesterolemia: Secondary | ICD-10-CM | POA: Diagnosis not present

## 2014-10-02 DIAGNOSIS — D649 Anemia, unspecified: Secondary | ICD-10-CM | POA: Diagnosis not present

## 2014-10-02 DIAGNOSIS — E119 Type 2 diabetes mellitus without complications: Secondary | ICD-10-CM | POA: Diagnosis not present

## 2014-10-09 DIAGNOSIS — Z23 Encounter for immunization: Secondary | ICD-10-CM | POA: Diagnosis not present

## 2014-10-09 DIAGNOSIS — H16139 Photokeratitis, unspecified eye: Secondary | ICD-10-CM | POA: Diagnosis not present

## 2014-10-09 DIAGNOSIS — I10 Essential (primary) hypertension: Secondary | ICD-10-CM | POA: Diagnosis not present

## 2014-10-09 DIAGNOSIS — E119 Type 2 diabetes mellitus without complications: Secondary | ICD-10-CM | POA: Diagnosis not present

## 2014-10-09 DIAGNOSIS — E780 Pure hypercholesterolemia: Secondary | ICD-10-CM | POA: Diagnosis not present

## 2014-10-25 NOTE — Progress Notes (Signed)
HPI:FU CAD and atrial fibrillation. He underwent CABG (LIMA-LAD, SVG-OM 2, SVG-PDA) along with modified Cox Maze IV procedure in 12/2007. 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 Abdominal US 3/12: No aneurysm. Myoview 12/2011: EF 66%, no scar or ischemia; normal. 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 He underwent craniotomy and evacuation by Dr. Saintclair Halsted. He was taken off of Coumadin and no longer felt to be an anticoagulation candidate. Pulmonary eval 6/14 for dyspnea. Echo 6/14: Mild LVH, EF 17%, diastolic dysfunction, mild LAE, RV dysfunction, PASP 34. PFTs demonstrated reduced DLCO. Chest CT 5/15 showed fibrosis and unchanged RLL nodule; fu 24 months recommended. Since last seen, patient has some dyspnea on exertion which is unchanged. No orthopnea, PND, pedal edema, syncope or chest pain.   Current Outpatient Prescriptions  Medication Sig Dispense Refill  . furosemide (LASIX) 40 MG tablet Take 40 mg by mouth daily.     [name] lisinopril (PRINIVIL,ZESTRIL) 2.5 MG tablet Take 1 tablet by mouth daily.    . metoprolol (LOPRESSOR) 50 MG tablet Take 50 mg by mouth 2 (two) times daily.    [name] omega-3 acid ethyl esters (LOVAZA) 1 G capsule Take 2 g by mouth 3 (three) times daily.    [name] oxybutynin (DITROPAN-XL) 10 MG 24 hr tablet Take 10 mg by mouth daily.     . potassium chloride SA (K-DUR,KLOR-CON) 20 MEQ tablet Take 20 mEq by mouth daily.      . rosuvastatin (CRESTOR) 40 MG tablet Take 1 tablet (40 mg total) by mouth daily. 30 tablet 4  . sertraline (ZOLOFT) 100 MG tablet Take 100 mg by mouth daily.    . Tamsulosin HCl (FLOMAX) 0.4 MG CAPS Take 0.4 mg by mouth at bedtime.      No current facility-administered medications for this visit.     Past Medical History  Diagnosis Date  . Pacemaker     PPM - St. Jude  . DM (diabetes mellitus)   . Hyperlipidemia type II   . OSA (obstructive sleep apnea)   . BPH (benign prostatic  hyperplasia)   . Subdural hematoma 07/2012    spontaneous;  coumadin d/c'd => no longer a candidate for anticoagulation  . Pulmonary fibrosis     Multaq d/c'd 7/14  . CAD (coronary artery disease), native coronary artery     s/p CABG 12/2007;  Myoview 12/2011: EF 66%, no scar or ischemia; normal.  . Atrial fibrillation     s/p Cox Maze 1/09;  Multaq Rx d/c'd in 2014 due to pulmo fibrosis;  coumadin d/c'd in 2014 due to spontaneous subdural hematoma  . Hypertension     Past Surgical History  Procedure Laterality Date  . Foot surgery    . Cholecystectomy    . Appendectomy    . Orchiectomy      Left  /  testicular cancer  . Hernia repair    . Coronary artery bypass graft      x3 (left internal mammary artery to distal left anterior descending coronary artery, saphenous vain graft to second circumflex marginal branch, saphenous vain graft to posterior descending coronary artery, endoscopic saphenous vain harvest from right thigh) and modified Cox - Maze IV procedure.  [name],MD. Electronically signed CHO/MEDQ D: 01/09/2008/ JOB: [id number] cc:  [name]  . Pacemaker placement      PPM - St. Jude  . Craniotomy  07/30/2012    Procedure: CRANIOTOMY HEMATOMA EVACUATION SUBDURAL;  Surgeon:  [name], MD;  Location: [facility];  Service: Neurosurgery;  Laterality: Right;  Right craniotomy for evacuation of subdural hematoma    History   Social History  . Marital Status: Married    Spouse Name: N/A    Number of Children: N/A  . Years of Education: N/A   Occupational History  . Not on file.   Social History Main Topics  . Smoking status: Former Smoker    Quit date: 02/21/1991  . Smokeless tobacco: Not on file  . Alcohol Use: No  . Drug Use: No  . Sexual Activity: Not Currently   Other Topics Concern  . 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  He is a Engineer, structural.      ROS: Some problems with gout but no fevers or chills,  productive cough, hemoptysis, dysphasia, odynophagia, melena, hematochezia, dysuria, hematuria, rash, seizure activity, orthopnea, PND, pedal edema, claudication. Remaining systems are negative.  Physical Exam: Well-developed well-nourished in no acute distress.  Skin is warm and dry.  HEENT is normal.  Neck is supple.  Chest is clear to auscultation with normal expansion.  Cardiovascular exam is regular rate and rhythm.  Abdominal exam nontender or distended. No masses palpated. Extremities show no edema. neuro grossly intact  ECG Probable sinus at a rate of 70. Right bundle branch block.

## 2014-10-26 ENCOUNTER — Ambulatory Visit (INDEPENDENT_AMBULATORY_CARE_PROVIDER_SITE_OTHER): Payer: Medicare Other | Admitting: Cardiology

## 2014-10-26 ENCOUNTER — Encounter: Payer: Self-pay | Admitting: Cardiology

## 2014-10-26 VITALS — BP 124/78 | HR 70 | Ht 68.0 in | Wt 228.1 lb

## 2014-10-26 DIAGNOSIS — Z950 Presence of cardiac pacemaker: Secondary | ICD-10-CM

## 2014-10-26 DIAGNOSIS — R911 Solitary pulmonary nodule: Secondary | ICD-10-CM

## 2014-10-26 DIAGNOSIS — I2510 Atherosclerotic heart disease of native coronary artery without angina pectoris: Secondary | ICD-10-CM

## 2014-10-26 DIAGNOSIS — I480 Paroxysmal atrial fibrillation: Secondary | ICD-10-CM

## 2014-10-26 DIAGNOSIS — I10 Essential (primary) hypertension: Secondary | ICD-10-CM

## 2014-10-26 NOTE — Assessment & Plan Note (Signed)
Continue statin. Not on aspirin given history of intracranial hemorrhage.

## 2014-10-26 NOTE — Patient Instructions (Signed)
Your physician wants you to follow-up in: ONE YEAR WITH DR CRENSHAW You will receive a reminder letter in the mail two months in advance. If you don't receive a letter, please call our office to schedule the follow-up appointment.  

## 2014-10-26 NOTE — Assessment & Plan Note (Signed)
Blood pressure controlled. Continue present medications. 

## 2014-10-26 NOTE — Assessment & Plan Note (Signed)
Continue statin. 

## 2014-10-26 NOTE — Assessment & Plan Note (Signed)
Patient will need follow-up chest CT May 2017.

## 2014-10-26 NOTE — Assessment & Plan Note (Signed)
Followed by Electrophysiology. 

## 2014-10-26 NOTE — Assessment & Plan Note (Signed)
Patient in probable sinus today. His multaq was discontinued as pulmonary felt it may be causing pulmonary fibrosis and dyspnea. I will not add an additional antiarrhythmic at this point as my hope will be that he will hold sinus rhythm on his own. He is not on anticoagulation given previous spontaneous subdural hematoma. We discussed the risk of an embolic event today. However we have no other choice but to continue off anticoagulation. Continue beta blocker.

## 2014-11-17 ENCOUNTER — Other Ambulatory Visit: Payer: Self-pay

## 2014-11-17 MED ORDER — ROSUVASTATIN CALCIUM 40 MG PO TABS: 40.0000 mg | ORAL_TABLET | Freq: Every day | ORAL | Status: DC

## 2014-11-23 ENCOUNTER — Ambulatory Visit (INDEPENDENT_AMBULATORY_CARE_PROVIDER_SITE_OTHER): Payer: Medicare Other | Admitting: *Deleted

## 2014-11-23 ENCOUNTER — Other Ambulatory Visit: Payer: Self-pay

## 2014-11-23 DIAGNOSIS — R001 Bradycardia, unspecified: Secondary | ICD-10-CM | POA: Diagnosis not present

## 2014-11-23 DIAGNOSIS — I480 Paroxysmal atrial fibrillation: Secondary | ICD-10-CM

## 2014-11-23 MED ORDER — ROSUVASTATIN CALCIUM 40 MG PO TABS: 40.0000 mg | ORAL_TABLET | Freq: Every day | ORAL | Status: DC

## 2014-11-23 NOTE — Progress Notes (Signed)
PPM check in office. 

## 2014-11-26 LAB — MDC_IDC_ENUM_SESS_TYPE_INCLINIC
Battery Voltage: 2.74 V
Brady Statistic RA Percent Paced: 96 %
Brady Statistic RV Percent Paced: 2.5 %
Implantable Pulse Generator Model: 5826
Implantable Pulse Generator Serial Number: 2109558
Lead Channel Impedance Value: 450 Ohm
Lead Channel Impedance Value: 550 Ohm
Lead Channel Pacing Threshold Amplitude: 0.625 V
Lead Channel Pacing Threshold Amplitude: 1 V
Lead Channel Pacing Threshold Pulse Width: 0.5 ms
Lead Channel Pacing Threshold Pulse Width: 0.5 ms
Lead Channel Sensing Intrinsic Amplitude: 1.6 mV
Lead Channel Sensing Intrinsic Amplitude: 7.2 mV
Lead Channel Setting Pacing Amplitude: 2 V
Lead Channel Setting Pacing Pulse Width: 0.5 ms
Lead Channel Setting Sensing Sensitivity: 2 mV

## 2014-12-04 ENCOUNTER — Encounter: Payer: Self-pay | Admitting: Internal Medicine

## 2015-01-20 DIAGNOSIS — L820 Inflamed seborrheic keratosis: Secondary | ICD-10-CM | POA: Diagnosis not present

## 2015-01-20 DIAGNOSIS — D485 Neoplasm of uncertain behavior of skin: Secondary | ICD-10-CM | POA: Diagnosis not present

## 2015-01-20 DIAGNOSIS — L570 Actinic keratosis: Secondary | ICD-10-CM | POA: Diagnosis not present

## 2015-01-28 DIAGNOSIS — R3915 Urgency of urination: Secondary | ICD-10-CM | POA: Diagnosis not present

## 2015-01-28 DIAGNOSIS — N401 Enlarged prostate with lower urinary tract symptoms: Secondary | ICD-10-CM | POA: Diagnosis not present

## 2015-03-16 DIAGNOSIS — Z0000 Encounter for general adult medical examination without abnormal findings: Secondary | ICD-10-CM | POA: Diagnosis not present

## 2015-03-16 DIAGNOSIS — E780 Pure hypercholesterolemia: Secondary | ICD-10-CM | POA: Diagnosis not present

## 2015-03-16 DIAGNOSIS — I10 Essential (primary) hypertension: Secondary | ICD-10-CM | POA: Diagnosis not present

## 2015-03-16 DIAGNOSIS — E119 Type 2 diabetes mellitus without complications: Secondary | ICD-10-CM | POA: Diagnosis not present

## 2015-03-16 DIAGNOSIS — Z125 Encounter for screening for malignant neoplasm of prostate: Secondary | ICD-10-CM | POA: Diagnosis not present

## 2015-03-22 DIAGNOSIS — J45909 Unspecified asthma, uncomplicated: Secondary | ICD-10-CM | POA: Diagnosis not present

## 2015-03-22 DIAGNOSIS — E119 Type 2 diabetes mellitus without complications: Secondary | ICD-10-CM | POA: Diagnosis not present

## 2015-03-22 DIAGNOSIS — E780 Pure hypercholesterolemia: Secondary | ICD-10-CM | POA: Diagnosis not present

## 2015-03-22 DIAGNOSIS — R160 Hepatomegaly, not elsewhere classified: Secondary | ICD-10-CM | POA: Diagnosis not present

## 2015-03-22 DIAGNOSIS — L570 Actinic keratosis: Secondary | ICD-10-CM | POA: Diagnosis not present

## 2015-03-23 ENCOUNTER — Other Ambulatory Visit: Payer: Self-pay | Admitting: Internal Medicine

## 2015-03-23 DIAGNOSIS — R160 Hepatomegaly, not elsewhere classified: Secondary | ICD-10-CM

## 2015-03-29 ENCOUNTER — Ambulatory Visit
Admission: RE | Admit: 2015-03-29 | Discharge: 2015-03-29 | Disposition: A | Discharge status: Home / Self Care | Payer: Medicare Other | Source: Ambulatory Visit | Attending: Internal Medicine | Admitting: Internal Medicine

## 2015-03-29 DIAGNOSIS — K760 Fatty (change of) liver, not elsewhere classified: Secondary | ICD-10-CM | POA: Diagnosis not present

## 2015-03-29 DIAGNOSIS — Z9049 Acquired absence of other specified parts of digestive tract: Secondary | ICD-10-CM | POA: Diagnosis not present

## 2015-03-29 DIAGNOSIS — R160 Hepatomegaly, not elsewhere classified: Secondary | ICD-10-CM

## 2015-03-29 DIAGNOSIS — N281 Cyst of kidney, acquired: Secondary | ICD-10-CM | POA: Diagnosis not present

## 2015-04-27 ENCOUNTER — Telehealth: Payer: Self-pay | Admitting: *Deleted

## 2015-04-27 DIAGNOSIS — R911 Solitary pulmonary nodule: Secondary | ICD-10-CM

## 2015-04-27 NOTE — Telephone Encounter (Signed)
Spoke with pt, He is due to have a one year follow up on lung nodule. Ct to be done at [address] location 05/04/15 @ 8:30 am.

## 2015-05-04 ENCOUNTER — Ambulatory Visit (INDEPENDENT_AMBULATORY_CARE_PROVIDER_SITE_OTHER)
Admission: RE | Admit: 2015-05-04 | Discharge: 2015-05-04 | Disposition: A | Discharge status: Home / Self Care | Payer: Medicare Other | Source: Ambulatory Visit | Attending: Cardiology | Admitting: Cardiology

## 2015-05-04 DIAGNOSIS — R911 Solitary pulmonary nodule: Secondary | ICD-10-CM | POA: Diagnosis not present

## 2015-05-20 DIAGNOSIS — H25013 Cortical age-related cataract, bilateral: Secondary | ICD-10-CM | POA: Diagnosis not present

## 2015-05-20 DIAGNOSIS — H2513 Age-related nuclear cataract, bilateral: Secondary | ICD-10-CM | POA: Diagnosis not present

## 2015-05-20 DIAGNOSIS — H04123 Dry eye syndrome of bilateral lacrimal glands: Secondary | ICD-10-CM | POA: Diagnosis not present

## 2015-05-20 DIAGNOSIS — H01021 Squamous blepharitis right upper eyelid: Secondary | ICD-10-CM | POA: Diagnosis not present

## 2015-06-18 ENCOUNTER — Encounter: Payer: Self-pay | Admitting: *Deleted

## 2015-07-02 DIAGNOSIS — M109 Gout, unspecified: Secondary | ICD-10-CM | POA: Diagnosis not present

## 2015-07-02 DIAGNOSIS — E119 Type 2 diabetes mellitus without complications: Secondary | ICD-10-CM | POA: Diagnosis not present

## 2015-07-03 ENCOUNTER — Other Ambulatory Visit: Payer: Self-pay | Admitting: Cardiology

## 2015-07-06 DIAGNOSIS — E1121 Type 2 diabetes mellitus with diabetic nephropathy: Secondary | ICD-10-CM | POA: Diagnosis not present

## 2015-07-06 DIAGNOSIS — M109 Gout, unspecified: Secondary | ICD-10-CM | POA: Diagnosis not present

## 2015-07-06 DIAGNOSIS — R160 Hepatomegaly, not elsewhere classified: Secondary | ICD-10-CM | POA: Diagnosis not present

## 2015-07-07 ENCOUNTER — Other Ambulatory Visit: Payer: Self-pay | Admitting: Cardiology

## 2015-07-07 ENCOUNTER — Other Ambulatory Visit: Payer: Self-pay

## 2015-07-07 MED ORDER — METOPROLOL TARTRATE 50 MG PO TABS: 50.0000 mg | ORAL_TABLET | Freq: Two times a day (BID) | ORAL | Status: DC

## 2015-07-20 ENCOUNTER — Encounter: Payer: Self-pay | Admitting: Internal Medicine

## 2015-07-20 ENCOUNTER — Ambulatory Visit (INDEPENDENT_AMBULATORY_CARE_PROVIDER_SITE_OTHER): Payer: Medicare Other | Admitting: Internal Medicine

## 2015-07-20 VITALS — BP 110/60 | HR 75 | Ht 68.0 in | Wt 222.6 lb

## 2015-07-20 DIAGNOSIS — Z950 Presence of cardiac pacemaker: Secondary | ICD-10-CM | POA: Diagnosis not present

## 2015-07-20 DIAGNOSIS — E669 Obesity, unspecified: Secondary | ICD-10-CM

## 2015-07-20 DIAGNOSIS — I4891 Unspecified atrial fibrillation: Secondary | ICD-10-CM

## 2015-07-20 LAB — CUP PACEART INCLINIC DEVICE CHECK
Date Time Interrogation Session: 20160802120501
Lead Channel Setting Pacing Amplitude: 2 V
Lead Channel Setting Pacing Pulse Width: 0.5 ms
Lead Channel Setting Sensing Sensitivity: 2 mV
Pulse Gen Model: 5826
Pulse Gen Serial Number: 2109558

## 2015-07-20 NOTE — Patient Instructions (Signed)
Medication Instructions:  Your physician recommends that you continue on your current medications as directed. Please refer to the Current Medication list given to you today.   Labwork: NONE  Testing/Procedures: NONE  Follow-Up: Your physician wants you to follow-up in: 12 months with Dr. Lovena Le. You will receive a reminder letter in the mail two months in advance. If you don't receive a letter, please call our office to schedule the follow-up appointment.  Your physician wants you to follow-up in: 6 months with [place] will receive a reminder letter in the mail two months in advance. If you don't receive a letter, please call our office to schedule the follow-up appointment.   Any Other Special Instructions Will Be Listed Below (If Applicable).

## 2015-07-20 NOTE — Assessment & Plan Note (Addendum)
He is maintaining NSR 99% of the time. No change in meds.

## 2015-07-20 NOTE — Assessment & Plan Note (Signed)
His St. Jude DDD PM is working normally. Will recheck in several months. 

## 2015-07-20 NOTE — Progress Notes (Signed)
HPI [name] returns today for followup. He is a pleasant 74 yo man with a h/o HTN, symptomatic bradycardia due to sinus node dysfunction. He is s/p PPM insertion. He denies chest pain or sob at rest. He does note dyspnea with exertion, especially with going up stairs. He has had mild peripheral edema. He is no longer on any anti-coagulation after a subdural hematoma. No Known Allergies   Current Outpatient Prescriptions  Medication Sig Dispense Refill  . colchicine 0.6 MG tablet TAKE 1 TABLET 3 TIMES A DAY AS DIRECTED  1  . CRESTOR 40 MG tablet TAKE 1 TABLET BY MOUTH EVERY DAY 30 tablet 3  . furosemide (LASIX) 40 MG tablet Take 40 mg by mouth daily.     [name] lisinopril (PRINIVIL,ZESTRIL) 2.5 MG tablet Take 1 tablet by mouth daily.    . metoprolol (LOPRESSOR) 50 MG tablet Take 1 tablet (50 mg total) by mouth 2 (two) times daily. 60 tablet 3  . omega-3 acid ethyl esters (LOVAZA) 1 G capsule Take 1 g by mouth 3 (three) times daily.     [name] oxybutynin (DITROPAN-XL) 10 MG 24 hr tablet Take 10 mg by mouth daily.     . potassium chloride SA (K-DUR,KLOR-CON) 20 MEQ tablet Take 20 mEq by mouth daily.      . sertraline (ZOLOFT) 100 MG tablet Take 100 mg by mouth daily.    . Tamsulosin HCl (FLOMAX) 0.4 MG CAPS Take 0.4 mg by mouth at bedtime.      No current facility-administered medications for this visit.     Past Medical History  Diagnosis Date  . Pacemaker     PPM - St. Jude  . DM (diabetes mellitus)   . Hyperlipidemia type II   . OSA (obstructive sleep apnea)   . BPH (benign prostatic hyperplasia)   . Subdural hematoma 07/2012    spontaneous;  coumadin d/c'd => no longer a candidate for anticoagulation  . Pulmonary fibrosis     Multaq d/c'd 7/14  . CAD (coronary artery disease), native coronary artery     s/p CABG 12/2007;  Myoview 12/2011: EF 66%, no scar or ischemia; normal.  . Atrial fibrillation     s/p Cox Maze 1/09;  Multaq Rx d/c'd in 2014 due to pulmo fibrosis;  coumadin d/c'd in  2014 due to spontaneous subdural hematoma  . Hypertension     ROS:   All systems reviewed and negative except as noted in the HPI.   Past Surgical History  Procedure Laterality Date  . Foot surgery    . Cholecystectomy    . Appendectomy    . Orchiectomy      Left  /  testicular cancer  . Hernia repair    . Coronary artery bypass graft      x3 (left internal mammary artery to distal left anterior descending coronary artery, saphenous vain graft to second circumflex marginal branch, saphenous vain graft to posterior descending coronary artery, endoscopic saphenous vain harvest from right thigh) and modified Cox - Maze IV procedure.  [name],MD. Electronically signed CHO/MEDQ D: 01/09/2008/ JOB: [id number] cc:  [name]  . Pacemaker placement      PPM - St. Jude  . Craniotomy  07/30/2012    Procedure: CRANIOTOMY HEMATOMA EVACUATION SUBDURAL;  Surgeon: [name], MD;  Location: [facility];  Service: Neurosurgery;  Laterality: Right;  Right craniotomy for evacuation of subdural hematoma     Family History  Problem Relation Age of  Onset  . Heart disease Father   . Heart attack Father   . Heart failure Father   . Heart disease Mother   . Alzheimer's disease Mother      History   Social History  . Marital Status: Married    Spouse Name: N/A  . Number of Children: N/A  . Years of Education: N/A   Occupational History  . Not on file.   Social History Main Topics  . Smoking status: Former Smoker    Quit date: 02/21/1991  . Smokeless tobacco: Not on file  . Alcohol Use: No  . Drug Use: No  . Sexual Activity: Not Currently   Other Topics Concern  . 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  He is a Engineer, structural.       BP 110/60 mmHg  Pulse 75  Ht 5\' 8"  (1.727 m)  Wt 222 lb 9.6 oz (100.971 kg)  BMI 33.85 kg/m2  SpO2 96%  Physical Exam:  Well appearing, obese, 74 year old man, NAD HEENT: Unremarkable Neck:  7 cm  JVD, no thyromegally Back:  No CVA tenderness Lungs:  Clear with no wheezes, rales, or rhonchi. Well healed PPM incision. HEART:  Regular rate rhythm, no murmurs, no rubs, no clicks Abd:  soft, obese, positive bowel sounds, no organomegally, no rebound, no guarding Ext:  2 plus pulses, no edema, no cyanosis, no clubbing Skin:  No rashes no nodules Neuro:  CN II through XII intact, motor grossly intact  DEVICE  Normal device function.  See PaceArt for details.   Assess/Plan:

## 2015-07-20 NOTE — Assessment & Plan Note (Addendum)
He is encouraged to lose weight. Will follow.

## 2015-09-08 DIAGNOSIS — M109 Gout, unspecified: Secondary | ICD-10-CM | POA: Diagnosis not present

## 2015-09-08 DIAGNOSIS — M1990 Unspecified osteoarthritis, unspecified site: Secondary | ICD-10-CM | POA: Diagnosis not present

## 2015-09-08 DIAGNOSIS — M79671 Pain in right foot: Secondary | ICD-10-CM | POA: Diagnosis not present

## 2015-09-25 ENCOUNTER — Other Ambulatory Visit: Payer: Self-pay | Admitting: Cardiology

## 2015-09-27 NOTE — Telephone Encounter (Signed)
REFILL 

## 2015-10-26 DIAGNOSIS — L570 Actinic keratosis: Secondary | ICD-10-CM | POA: Diagnosis not present

## 2015-10-26 DIAGNOSIS — L820 Inflamed seborrheic keratosis: Secondary | ICD-10-CM | POA: Diagnosis not present

## 2015-10-26 DIAGNOSIS — D225 Melanocytic nevi of trunk: Secondary | ICD-10-CM | POA: Diagnosis not present

## 2015-10-26 DIAGNOSIS — L821 Other seborrheic keratosis: Secondary | ICD-10-CM | POA: Diagnosis not present

## 2015-11-01 ENCOUNTER — Other Ambulatory Visit: Payer: Self-pay | Admitting: Cardiology

## 2015-11-08 DIAGNOSIS — R160 Hepatomegaly, not elsewhere classified: Secondary | ICD-10-CM | POA: Diagnosis not present

## 2015-11-08 DIAGNOSIS — E1121 Type 2 diabetes mellitus with diabetic nephropathy: Secondary | ICD-10-CM | POA: Diagnosis not present

## 2015-11-10 DIAGNOSIS — M79671 Pain in right foot: Secondary | ICD-10-CM | POA: Diagnosis not present

## 2015-11-10 DIAGNOSIS — M1990 Unspecified osteoarthritis, unspecified site: Secondary | ICD-10-CM | POA: Diagnosis not present

## 2015-11-10 DIAGNOSIS — M109 Gout, unspecified: Secondary | ICD-10-CM | POA: Diagnosis not present

## 2015-11-17 DIAGNOSIS — D649 Anemia, unspecified: Secondary | ICD-10-CM | POA: Diagnosis not present

## 2015-11-19 NOTE — Progress Notes (Signed)
HPI: FU CAD and atrial fibrillation. He underwent CABG (LIMA-LAD, SVG-OM 2, SVG-PDA) along with modified Cox Maze IV procedure in 12/2007. 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 Abdominal US 3/12: No aneurysm. Myoview 12/2011: EF 66%, no scar or ischemia; normal. 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 He underwent craniotomy and evacuation by Dr. Saintclair Halsted. He was taken off of Coumadin and no longer felt to be an anticoagulation candidate. Echo 6/14: Mild LVH, EF [id number], diastolic dysfunction, mild LAE, RV dysfunction, PASP 34. PFTs demonstrated reduced DLCO. Chest CT 5/16 showed stable right lower lobe pulmonary nodule consistent with benign etiology and mild interstitial lung disease. Since last seen,   Current Outpatient Prescriptions  Medication Sig Dispense Refill  . colchicine 0.6 MG tablet TAKE 1 TABLET 3 TIMES A DAY AS DIRECTED  1  . CRESTOR 40 MG tablet TAKE 1 TABLET BY MOUTH EVERY DAY 30 tablet 9  . furosemide (LASIX) 40 MG tablet Take 40 mg by mouth daily.     [name] lisinopril (PRINIVIL,ZESTRIL) 2.5 MG tablet Take 1 tablet by mouth daily.    . metoprolol (LOPRESSOR) 50 MG tablet TAKE 1 TABLET TWICE DAILY 180 tablet 3  . omega-3 acid ethyl esters (LOVAZA) 1 G capsule Take 1 g by mouth 3 (three) times daily.     [name] oxybutynin (DITROPAN-XL) 10 MG 24 hr tablet Take 10 mg by mouth daily.     . potassium chloride SA (K-DUR,KLOR-CON) 20 MEQ tablet Take 20 mEq by mouth daily.      . sertraline (ZOLOFT) 100 MG tablet Take 100 mg by mouth daily.    . Tamsulosin HCl (FLOMAX) 0.4 MG CAPS Take 0.4 mg by mouth at bedtime.      No current facility-administered medications for this visit.     Past Medical History  Diagnosis Date  . Pacemaker     PPM - St. Jude  . DM (diabetes mellitus)   . Hyperlipidemia type II   . OSA (obstructive sleep apnea)   . BPH (benign prostatic hyperplasia)   . Subdural hematoma 07/2012   spontaneous;  coumadin d/c'd => no longer a candidate for anticoagulation  . Pulmonary fibrosis     Multaq d/c'd 7/14  . CAD (coronary artery disease), native coronary artery     s/p CABG 12/2007;  Myoview 12/2011: EF 66%, no scar or ischemia; normal.  . Atrial fibrillation     s/p Cox Maze 1/09;  Multaq Rx d/c'd in 2014 due to pulmo fibrosis;  coumadin d/c'd in 2014 due to spontaneous subdural hematoma  . Hypertension     Past Surgical History  Procedure Laterality Date  . Foot surgery    . Cholecystectomy    . Appendectomy    . Orchiectomy      Left  /  testicular cancer  . Hernia repair    . Coronary artery bypass graft      x3 (left internal mammary artery to distal left anterior descending coronary artery, saphenous vain graft to second circumflex marginal branch, saphenous vain graft to posterior descending coronary artery, endoscopic saphenous vain harvest from right thigh) and modified Cox - Maze IV procedure.  [name],MD. Electronically signed CHO/MEDQ D: 01/09/2008/ [id number] cc:  [name]  . Pacemaker placement      PPM - St. Jude  . Craniotomy  07/30/2012    Procedure: CRANIOTOMY HEMATOMA EVACUATION SUBDURAL;  Surgeon: [name], MD;  Location:  [facility];  Service: Neurosurgery;  Laterality: Right;  Right craniotomy for evacuation of subdural hematoma    Social History   Social History  . Marital Status: Married    Spouse Name: N/A  . Number of Children: N/A  . Years of Education: N/A   Occupational History  . Not on file.   Social History Main Topics  . Smoking status: Former Smoker    Quit date: 02/21/1991  . Smokeless tobacco: Not on file  . Alcohol Use: No  . Drug Use: No  . Sexual Activity: Not Currently   Other Topics Concern  . 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  He is a Engineer, structural.      ROS: no fevers or chills, productive cough, hemoptysis, dysphasia, odynophagia, melena, hematochezia,  dysuria, hematuria, rash, seizure activity, orthopnea, PND, pedal edema, claudication. Remaining systems are negative.  Physical Exam: Well-developed well-nourished in no acute distress.  Skin is warm and dry.  HEENT is normal.  Neck is supple.  Chest is clear to auscultation with normal expansion.  Cardiovascular exam is regular rate and rhythm.  Abdominal exam nontender or distended. No masses palpated. Extremities show no edema. neuro grossly intact  ECG     This encounter was created in error - please disregard.

## 2015-11-23 ENCOUNTER — Encounter: Payer: Medicare Other | Admitting: Cardiology

## 2015-12-14 DIAGNOSIS — M1990 Unspecified osteoarthritis, unspecified site: Secondary | ICD-10-CM | POA: Diagnosis not present

## 2015-12-14 DIAGNOSIS — M79671 Pain in right foot: Secondary | ICD-10-CM | POA: Diagnosis not present

## 2015-12-14 DIAGNOSIS — M109 Gout, unspecified: Secondary | ICD-10-CM | POA: Diagnosis not present

## 2015-12-27 ENCOUNTER — Ambulatory Visit (INDEPENDENT_AMBULATORY_CARE_PROVIDER_SITE_OTHER): Payer: Medicare Other | Admitting: Cardiology

## 2015-12-27 ENCOUNTER — Encounter: Payer: Self-pay | Admitting: Cardiology

## 2015-12-27 VITALS — BP 114/74 | HR 70 | Ht 68.0 in | Wt 232.0 lb

## 2015-12-27 DIAGNOSIS — I2510 Atherosclerotic heart disease of native coronary artery without angina pectoris: Secondary | ICD-10-CM | POA: Diagnosis not present

## 2015-12-27 DIAGNOSIS — I480 Paroxysmal atrial fibrillation: Secondary | ICD-10-CM

## 2015-12-27 DIAGNOSIS — I10 Essential (primary) hypertension: Secondary | ICD-10-CM

## 2015-12-27 DIAGNOSIS — Z950 Presence of cardiac pacemaker: Secondary | ICD-10-CM | POA: Diagnosis not present

## 2015-12-27 NOTE — Assessment & Plan Note (Signed)
Followed by electrophysiology. 

## 2015-12-27 NOTE — Assessment & Plan Note (Signed)
Patient remains in sinus. His multaq was discontinued previously as pulmonary felt it may be causing pulmonary fibrosis and dyspnea. I will not add an additional antiarrhythmic at this point as my hope will be that he will hold sinus rhythm on his own. He is not on anticoagulation given previous spontaneous subdural hematoma. We discussed the risk of an embolic event previously. However we have no other choice but to continue off anticoagulation. Continue beta blocker.

## 2015-12-27 NOTE — Progress Notes (Signed)
HPI: FU CAD and atrial fibrillation. He underwent CABG (LIMA-LAD, SVG-OM 2, SVG-PDA) along with modified Cox Maze IV procedure in 12/2007. 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 Abdominal US 3/12: No aneurysm. Myoview 12/2011: EF 66%, no scar or ischemia; normal. 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 He underwent craniotomy and evacuation by Dr. Saintclair Halsted. He was taken off of Coumadin and no longer felt to be an anticoagulation candidate. Pulmonary eval 6/14 for dyspnea. Echo 6/14: Mild LVH, EF [id number], diastolic dysfunction, mild LAE, RV dysfunction, PASP 34. PFTs demonstrated reduced DLCO. Since last seen, the patient has dyspnea with more extreme activities but not with routine activities. It is relieved with rest. It is not associated with chest pain. There is no orthopnea, PND. There is no syncope or palpitations. There is no exertional chest pain. Mild chronic pedal edema.   Current Outpatient Prescriptions  Medication Sig Dispense Refill  . colchicine 0.6 MG tablet TAKE 1 TABLET 3 TIMES A DAY AS DIRECTED  1  . CRESTOR 40 MG tablet TAKE 1 TABLET BY MOUTH EVERY DAY 30 tablet 9  . furosemide (LASIX) 40 MG tablet Take 40 mg by mouth daily.     [name] lisinopril (PRINIVIL,ZESTRIL) 2.5 MG tablet Take 1 tablet by mouth daily.    . metoprolol (LOPRESSOR) 50 MG tablet TAKE 1 TABLET TWICE DAILY 180 tablet 3  . omega-3 acid ethyl esters (LOVAZA) 1 G capsule Take 1 g by mouth 3 (three) times daily.     [name] oxybutynin (DITROPAN-XL) 10 MG 24 hr tablet Take 10 mg by mouth daily.     . potassium chloride SA (K-DUR,KLOR-CON) 20 MEQ tablet Take 20 mEq by mouth daily.      . sertraline (ZOLOFT) 100 MG tablet Take 100 mg by mouth daily.    . Tamsulosin HCl (FLOMAX) 0.4 MG CAPS Take 0.4 mg by mouth at bedtime.      No current facility-administered medications for this visit.     Past Medical History  Diagnosis Date  . Pacemaker     PPM -  St. Jude  . DM (diabetes mellitus) ([place])   . Hyperlipidemia type II   . OSA (obstructive sleep apnea)   . BPH (benign prostatic hyperplasia)   . Subdural hematoma ([place]) 07/2012    spontaneous;  coumadin d/c'd => no longer a candidate for anticoagulation  . 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 CAD (coronary artery disease), native coronary artery     s/p CABG 12/2007;  Myoview 12/2011: EF 66%, no scar or ischemia; normal.  . Atrial fibrillation ([place])     s/p Cox Maze 1/09;  Multaq Rx d/c'd in 2014 due to pulmo fibrosis;  coumadin d/c'd in 2014 due to spontaneous subdural hematoma  . Hypertension     Past Surgical History  Procedure Laterality Date  . Foot surgery    . Cholecystectomy    . Appendectomy    . Orchiectomy      Left  /  testicular cancer  . Hernia repair    . Coronary artery bypass graft      x3 (left internal mammary artery to distal left anterior descending coronary artery, saphenous vain graft to second circumflex marginal branch, saphenous vain graft to posterior descending coronary artery, endoscopic saphenous vain harvest from right thigh) and modified Cox - Maze IV procedure.  [name],MD. Electronically signed CHO/MEDQ D: 01/09/2008/ JOB: [id number] cc:  [name]  . Pacemaker placement      PPM - St. Jude  . Craniotomy  07/30/2012    Procedure: CRANIOTOMY HEMATOMA EVACUATION SUBDURAL;  Surgeon: [name], MD;  Location: [facility];  Service: Neurosurgery;  Laterality: Right;  Right craniotomy for evacuation of subdural hematoma    Social History   Social History  . Marital Status: Married    Spouse Name: N/A  . Number of Children: N/A  . Years of Education: N/A   Occupational History  . Not on file.   Social History Main Topics  . Smoking status: Former Smoker    Quit date: 02/21/1991  . Smokeless tobacco: Not on file  . Alcohol Use: No  . Drug Use: No  . Sexual Activity: Not Currently   Other Topics Concern  . Not on  file   Social History Narrative   Married with two children.  He is a Engineer, structural.      ROS: no fevers or chills, productive cough, hemoptysis, dysphasia, odynophagia, melena, hematochezia, dysuria, hematuria, rash, seizure activity, orthopnea, PND, pedal edema, claudication. Remaining systems are negative.  Physical Exam: Well-developed well-nourished in no acute distress.  Skin is warm and dry.  HEENT is normal.  Neck is supple.  Chest is clear to auscultation with normal expansion.  Cardiovascular exam is regular rate and rhythm.  Abdominal exam nontender or distended. No masses palpated. Extremities show trace edema. neuro grossly intact  ECG Atrial paced rhythm, right bundle branch block.

## 2015-12-27 NOTE — Assessment & Plan Note (Signed)
Continue statin. 

## 2015-12-27 NOTE — Assessment & Plan Note (Addendum)
Continue statin. Not on aspirin given history of spontaneous intracranial hemorrhage. I will forward this note to Dr. Saintclair Halsted of neurosurgery. He performed the surgery for the patient's intracranial hemorrhage in 2013. I would like the patient to be on aspirin long-term unless Dr. Saintclair Halsted feels contraindicated. This issue will also come up in the future if he needs interventions for his coronary disease. We will obviously avoid Coumadin in the future.

## 2015-12-27 NOTE — Assessment & Plan Note (Signed)
Blood pressure controlled. Continue present medications. 

## 2015-12-27 NOTE — Patient Instructions (Signed)
Your physician wants you to follow-up in: ONE YEAR WITH DR CRENSHAW You will receive a reminder letter in the mail two months in advance. If you don't receive a letter, please call our office to schedule the follow-up appointment.   If you need a refill on your cardiac medications before your next appointment, please call your pharmacy.  

## 2016-01-17 ENCOUNTER — Encounter: Payer: Self-pay | Admitting: Internal Medicine

## 2016-01-17 ENCOUNTER — Ambulatory Visit (INDEPENDENT_AMBULATORY_CARE_PROVIDER_SITE_OTHER): Payer: Medicare Other | Admitting: *Deleted

## 2016-01-17 DIAGNOSIS — Z950 Presence of cardiac pacemaker: Secondary | ICD-10-CM | POA: Diagnosis not present

## 2016-01-17 DIAGNOSIS — I480 Paroxysmal atrial fibrillation: Secondary | ICD-10-CM

## 2016-01-17 LAB — CUP PACEART INCLINIC DEVICE CHECK
Battery Impedance: 2300 Ohm
Battery Voltage: 2.76 V
Brady Statistic RA Percent Paced: 96 %
Brady Statistic RV Percent Paced: 13 %
Date Time Interrogation Session: 20170130100728
Implantable Lead Implant Date: 20090420
Implantable Lead Implant Date: 20090420
Implantable Lead Location: 753859
Implantable Lead Location: 753860
Lead Channel Impedance Value: 487 Ohm
Lead Channel Impedance Value: 594 Ohm
Lead Channel Pacing Threshold Amplitude: 0.625 V
Lead Channel Pacing Threshold Amplitude: 1.25 V
Lead Channel Pacing Threshold Pulse Width: 0.5 ms
Lead Channel Pacing Threshold Pulse Width: 0.5 ms
Lead Channel Sensing Intrinsic Amplitude: 1.7 mV
Lead Channel Sensing Intrinsic Amplitude: 8.4 mV
Lead Channel Setting Pacing Amplitude: 2.5 V
Lead Channel Setting Pacing Pulse Width: 0.5 ms
Lead Channel Setting Sensing Sensitivity: 2 mV
Pulse Gen Model: 5826
Pulse Gen Serial Number: 2109558

## 2016-01-17 NOTE — Progress Notes (Signed)
Pacemaker check in clinic. Normal device function. Thresholds, sensing, impedances consistent with previous measurements. Device programmed to maximize longevity. 543 mode switches (<1%), longest ~12sec, no EGMs as episode triggers disabled. PMT noted by device, V-A conduction ~436ms, unable to cover with PVARP. Device programmed at appropriate safety margins; reprogrammed RA output to 2.5V. Histogram distribution appropriate for patient activity level. Device programmed to optimize intrinsic conduction. Estimated longevity 3.5-4.5 years. Patient education completed. ROV with GT in 07/2016.

## 2016-01-31 DIAGNOSIS — L820 Inflamed seborrheic keratosis: Secondary | ICD-10-CM | POA: Diagnosis not present

## 2016-01-31 DIAGNOSIS — D2261 Melanocytic nevi of right upper limb, including shoulder: Secondary | ICD-10-CM | POA: Diagnosis not present

## 2016-01-31 DIAGNOSIS — D225 Melanocytic nevi of trunk: Secondary | ICD-10-CM | POA: Diagnosis not present

## 2016-01-31 DIAGNOSIS — L570 Actinic keratosis: Secondary | ICD-10-CM | POA: Diagnosis not present

## 2016-01-31 DIAGNOSIS — L821 Other seborrheic keratosis: Secondary | ICD-10-CM | POA: Diagnosis not present

## 2016-02-08 DIAGNOSIS — E119 Type 2 diabetes mellitus without complications: Secondary | ICD-10-CM | POA: Diagnosis not present

## 2016-03-13 DIAGNOSIS — N401 Enlarged prostate with lower urinary tract symptoms: Secondary | ICD-10-CM | POA: Diagnosis not present

## 2016-03-13 DIAGNOSIS — N138 Other obstructive and reflux uropathy: Secondary | ICD-10-CM | POA: Diagnosis not present

## 2016-03-13 DIAGNOSIS — R3915 Urgency of urination: Secondary | ICD-10-CM | POA: Diagnosis not present

## 2016-03-13 DIAGNOSIS — Z0000 Encounter for general adult medical examination without abnormal findings: Secondary | ICD-10-CM | POA: Diagnosis not present

## 2016-03-13 DIAGNOSIS — M79671 Pain in right foot: Secondary | ICD-10-CM | POA: Diagnosis not present

## 2016-03-13 DIAGNOSIS — M109 Gout, unspecified: Secondary | ICD-10-CM | POA: Diagnosis not present

## 2016-03-13 DIAGNOSIS — M1990 Unspecified osteoarthritis, unspecified site: Secondary | ICD-10-CM | POA: Diagnosis not present

## 2016-04-05 DIAGNOSIS — Z86010 Personal history of colonic polyps: Secondary | ICD-10-CM | POA: Diagnosis not present

## 2016-04-05 DIAGNOSIS — D126 Benign neoplasm of colon, unspecified: Secondary | ICD-10-CM | POA: Diagnosis not present

## 2016-04-05 DIAGNOSIS — D122 Benign neoplasm of ascending colon: Secondary | ICD-10-CM | POA: Diagnosis not present

## 2016-04-05 DIAGNOSIS — D125 Benign neoplasm of sigmoid colon: Secondary | ICD-10-CM | POA: Diagnosis not present

## 2016-04-05 DIAGNOSIS — D123 Benign neoplasm of transverse colon: Secondary | ICD-10-CM | POA: Diagnosis not present

## 2016-04-05 DIAGNOSIS — D213 Benign neoplasm of connective and other soft tissue of thorax: Secondary | ICD-10-CM | POA: Diagnosis not present

## 2016-05-16 DIAGNOSIS — E7800 Pure hypercholesterolemia, unspecified: Secondary | ICD-10-CM | POA: Diagnosis not present

## 2016-05-16 DIAGNOSIS — Z125 Encounter for screening for malignant neoplasm of prostate: Secondary | ICD-10-CM | POA: Diagnosis not present

## 2016-05-16 DIAGNOSIS — E119 Type 2 diabetes mellitus without complications: Secondary | ICD-10-CM | POA: Diagnosis not present

## 2016-05-16 DIAGNOSIS — Z0000 Encounter for general adult medical examination without abnormal findings: Secondary | ICD-10-CM | POA: Diagnosis not present

## 2016-05-19 DIAGNOSIS — M109 Gout, unspecified: Secondary | ICD-10-CM | POA: Diagnosis not present

## 2016-05-19 DIAGNOSIS — E1121 Type 2 diabetes mellitus with diabetic nephropathy: Secondary | ICD-10-CM | POA: Diagnosis not present

## 2016-05-19 DIAGNOSIS — R0609 Other forms of dyspnea: Secondary | ICD-10-CM | POA: Diagnosis not present

## 2016-05-19 DIAGNOSIS — M1990 Unspecified osteoarthritis, unspecified site: Secondary | ICD-10-CM | POA: Diagnosis not present

## 2016-05-22 DIAGNOSIS — H01021 Squamous blepharitis right upper eyelid: Secondary | ICD-10-CM | POA: Diagnosis not present

## 2016-05-22 DIAGNOSIS — H353111 Nonexudative age-related macular degeneration, right eye, early dry stage: Secondary | ICD-10-CM | POA: Diagnosis not present

## 2016-05-22 DIAGNOSIS — H2513 Age-related nuclear cataract, bilateral: Secondary | ICD-10-CM | POA: Diagnosis not present

## 2016-05-22 DIAGNOSIS — H353121 Nonexudative age-related macular degeneration, left eye, early dry stage: Secondary | ICD-10-CM | POA: Diagnosis not present

## 2016-05-22 DIAGNOSIS — H01024 Squamous blepharitis left upper eyelid: Secondary | ICD-10-CM | POA: Diagnosis not present

## 2016-05-22 DIAGNOSIS — G4733 Obstructive sleep apnea (adult) (pediatric): Secondary | ICD-10-CM | POA: Diagnosis not present

## 2016-05-22 DIAGNOSIS — H25013 Cortical age-related cataract, bilateral: Secondary | ICD-10-CM | POA: Diagnosis not present

## 2016-05-26 ENCOUNTER — Ambulatory Visit (HOSPITAL_COMMUNITY)
Admission: EM | Admit: 2016-05-26 | Discharge: 2016-05-26 | Disposition: A | Discharge status: Home / Self Care | Payer: Medicare Other | Attending: Family Medicine | Admitting: Family Medicine

## 2016-05-26 ENCOUNTER — Encounter (HOSPITAL_COMMUNITY): Payer: Self-pay | Admitting: Emergency Medicine

## 2016-05-26 DIAGNOSIS — I8393 Asymptomatic varicose veins of bilateral lower extremities: Secondary | ICD-10-CM | POA: Diagnosis not present

## 2016-05-26 NOTE — ED Provider Notes (Signed)
CSN: [id number]     Arrival date & time 05/26/16  1946 History   First MD Initiated Contact with Patient 05/26/16 2009     Chief Complaint  Patient presents with  . Leg Injury   (Consider location/radiation/quality/duration/timing/severity/associated sxs/prior Treatment) Patient is a 75 y.o. male presenting with leg pain. The history is provided by the patient and the spouse.  Leg Pain Location:  Leg Time since incident:  45 minutes Injury: no (spont bleeding from varicose vein cond in both lower legs.)   Leg location:  L lower leg Pain details:    Quality:  Sharp   Radiates to:  Does not radiate   Onset quality:  Sudden   Progression:  Resolved Chronicity:  Recurrent Dislocation: no   Tetanus status:  Up to date Prior injury to area:  No Relieved by:  Compression Worsened by:  Nothing tried Associated symptoms: no fever, no muscle weakness and no swelling     Past Medical History  Diagnosis Date  . Pacemaker     PPM - St. Jude  . DM (diabetes mellitus) ([place])   . Hyperlipidemia type II   . OSA (obstructive sleep apnea)   . BPH (benign prostatic hyperplasia)   . Subdural hematoma ([place]) 07/2012    spontaneous;  coumadin d/c'd => no longer a candidate for anticoagulation  . 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 CAD (coronary artery disease), native coronary artery     s/p CABG 12/2007;  Myoview 12/2011: EF 66%, no scar or ischemia; normal.  . Atrial fibrillation ([place])     s/p Cox Maze 1/09;  Multaq Rx d/c'd in 2014 due to pulmo fibrosis;  coumadin d/c'd in 2014 due to spontaneous subdural hematoma  . Hypertension    Past Surgical History  Procedure Laterality Date  . Foot surgery    . Cholecystectomy    . Appendectomy    . Orchiectomy      Left  /  testicular cancer  . Hernia repair    . Coronary artery bypass graft      x3 (left internal mammary artery to distal left anterior descending coronary artery, saphenous vain graft to second circumflex marginal  branch, saphenous vain graft to posterior descending coronary artery, endoscopic saphenous vain harvest from right thigh) and modified Cox - Maze IV procedure.  [name],MD. Electronically signed CHO/MEDQ D: 01/09/2008/ [id number] cc:  [name]  . Pacemaker placement      PPM - St. Jude  . Craniotomy  07/30/2012    Procedure: CRANIOTOMY HEMATOMA EVACUATION SUBDURAL;  Surgeon: [name], MD;  Location: [facility];  Service: Neurosurgery;  Laterality: Right;  Right craniotomy for evacuation of subdural hematoma   Family History  Problem Relation Age of Onset  . Heart disease Father   . Heart attack Father   . Heart failure Father   . Heart disease Mother   . Alzheimer's disease Mother    Social History  Substance Use Topics  . Smoking status: Former Smoker    Quit date: 02/21/1991  . Smokeless tobacco: None  . Alcohol Use: No    Review of Systems  Constitutional: Negative.  Negative for fever.  Musculoskeletal: Negative for gait problem.  Skin: Negative for color change and wound.  All other systems reviewed and are negative.   Allergies  Review of patient's allergies indicates no known allergies.  Home Medications   Prior to Admission medications   Medication  Sig Start Date End Date Taking? Authorizing Provider  colchicine 0.6 MG tablet TAKE 1 TABLET 3 TIMES A DAY AS DIRECTED 05/13/15   Historical Provider, MD  CRESTOR 40 MG tablet TAKE 1 TABLET BY MOUTH EVERY DAY 11/02/15   [name], MD  furosemide (LASIX) 40 MG tablet Take 40 mg by mouth daily.     Historical Provider, MD  lisinopril (PRINIVIL,ZESTRIL) 2.5 MG tablet Take 1 tablet by mouth daily. 05/03/14   Historical Provider, MD  metoprolol (LOPRESSOR) 50 MG tablet TAKE 1 TABLET TWICE DAILY 09/27/15   [name], MD  omega-3 acid ethyl esters (LOVAZA) 1 G capsule Take 1 g by mouth 3 (three) times daily.     Historical Provider, MD  oxybutynin (DITROPAN-XL) 10 MG 24 hr tablet Take 10 mg by  mouth daily.  04/07/13   Historical Provider, MD  potassium chloride SA (K-DUR,KLOR-CON) 20 MEQ tablet Take 20 mEq by mouth daily.      Historical Provider, MD  sertraline (ZOLOFT) 100 MG tablet Take 100 mg by mouth daily.    Historical Provider, MD  Tamsulosin HCl (FLOMAX) 0.4 MG CAPS Take 0.4 mg by mouth at bedtime.     Historical Provider, MD   Meds Ordered and Administered this Visit  Medications - No data to display  BP 121/69 mmHg  Pulse 69  Temp(Src) 97.7 F (36.5 C) (Oral)  Resp 19  SpO2 96% No data found.   Physical Exam  Constitutional: He is oriented to person, place, and time. He appears well-developed and well-nourished. No distress.  Neurological: He is alert and oriented to person, place, and time.  Skin: Skin is warm and dry.  Venous varicosities from stasis to both lower legs, , no bleeding at this time.no infection.  Nursing note and vitals reviewed.   ED Course  Procedures (including critical care time)  Labs Review Labs Reviewed - No data to display  Imaging Review No results found.   Visual Acuity Review  Right Eye Distance:   Left Eye Distance:   Bilateral Distance:    Right Eye Near:   Left Eye Near:    Bilateral Near:         MDM   1. Varicose veins of both lower extremities        Billy Fischer, MD 05/30/16 2053

## 2016-05-26 NOTE — ED Notes (Signed)
Patient has had a varicose vein on left leg that started bleeding this evening approx one hour ago.  Patient currently is not bleeding.  Patient has had a similar incident in the other leg.

## 2016-05-26 NOTE — Discharge Instructions (Signed)
Leave bandage on for 24-48 hrs, elevate and ice as needed.

## 2016-06-05 DIAGNOSIS — E7800 Pure hypercholesterolemia, unspecified: Secondary | ICD-10-CM | POA: Diagnosis not present

## 2016-06-13 DIAGNOSIS — M1990 Unspecified osteoarthritis, unspecified site: Secondary | ICD-10-CM | POA: Diagnosis not present

## 2016-06-13 DIAGNOSIS — M19072 Primary osteoarthritis, left ankle and foot: Secondary | ICD-10-CM | POA: Diagnosis not present

## 2016-06-13 DIAGNOSIS — M109 Gout, unspecified: Secondary | ICD-10-CM | POA: Diagnosis not present

## 2016-06-13 DIAGNOSIS — M19071 Primary osteoarthritis, right ankle and foot: Secondary | ICD-10-CM | POA: Diagnosis not present

## 2016-06-13 DIAGNOSIS — M79671 Pain in right foot: Secondary | ICD-10-CM | POA: Diagnosis not present

## 2016-07-08 ENCOUNTER — Ambulatory Visit (HOSPITAL_COMMUNITY)
Admission: EM | Admit: 2016-07-08 | Discharge: 2016-07-08 | Disposition: A | Discharge status: Home / Self Care | Payer: Medicare Other | Attending: Family Medicine | Admitting: Family Medicine

## 2016-07-08 ENCOUNTER — Encounter (HOSPITAL_COMMUNITY): Payer: Self-pay | Admitting: Emergency Medicine

## 2016-07-08 DIAGNOSIS — S61219A Laceration without foreign body of unspecified finger without damage to nail, initial encounter: Secondary | ICD-10-CM | POA: Diagnosis not present

## 2016-07-08 NOTE — Discharge Instructions (Signed)
Laceration Care, Adult  A laceration is a cut that goes through all layers of the skin. The cut also goes into the tissue that is right under the skin. Some cuts heal on their own. Others need to be closed with stitches (sutures), staples, skin adhesive strips, or wound glue. Taking care of your cut lowers your risk of infection and helps your cut to heal better.  HOW TO TAKE CARE OF YOUR CUT  For stitches or staples:  · Keep the wound clean and dry.  · If you were given a bandage (dressing), you should change it at least one time per day or as told by your doctor. You should also change it if it gets wet or dirty.  · Keep the wound completely dry for the first 24 hours or as told by your doctor. After that time, you may take a shower or a bath. However, make sure that the wound is not soaked in water until after the stitches or staples have been removed.  · Clean the wound one time each day or as told by your doctor:    Wash the wound with soap and water.    Rinse the wound with water until all of the soap comes off.    Pat the wound dry with a clean towel. Do not rub the wound.  · After you clean the wound, put a thin layer of antibiotic ointment on it as told by your doctor. This ointment:    Helps to prevent infection.    Keeps the bandage from sticking to the wound.  · Have your stitches or staples removed as told by your doctor.  If your doctor used skin adhesive strips:   · Keep the wound clean and dry.  · If you were given a bandage, you should change it at least one time per day or as told by your doctor. You should also change it if it gets dirty or wet.  · Do not get the skin adhesive strips wet. You can take a shower or a bath, but be careful to keep the wound dry.  · If the wound gets wet, pat it dry with a clean towel. Do not rub the wound.  · Skin adhesive strips fall off on their own. You can trim the strips as the wound heals. Do not remove any strips that are still stuck to the wound. They will  fall off after a while.  If your doctor used wound glue:  · Try to keep your wound dry, but you may briefly wet it in the shower or bath. Do not soak the wound in water, such as by swimming.  · After you take a shower or a bath, gently pat the wound dry with a clean towel. Do not rub the wound.  · Do not do any activities that will make you really sweaty until the skin glue has fallen off on its own.  · Do not apply liquid, cream, or ointment medicine to your wound while the skin glue is still on.  · If you were given a bandage, you should change it at least one time per day or as told by your doctor. You should also change it if it gets dirty or wet.  · If a bandage is placed over the wound, do not let the tape for the bandage touch the skin glue.  · Do not pick at the glue. The skin glue usually stays on for 5-10 days. Then, it   falls off of the skin.  General Instructions   · To help prevent scarring, make sure to cover your wound with sunscreen whenever you are outside after stitches are removed, after adhesive strips are removed, or when wound glue stays in place and the wound is healed. Make sure to wear a sunscreen of at least 30 SPF.  · Take over-the-counter and prescription medicines only as told by your doctor.  · If you were given antibiotic medicine or ointment, take or apply it as told by your doctor. Do not stop using the antibiotic even if your wound is getting better.  · Do not scratch or pick at the wound.  · Keep all follow-up visits as told by your doctor. This is important.  · Check your wound every day for signs of infection. Watch for:    Redness, swelling, or pain.    Fluid, blood, or pus.  · Raise (elevate) the injured area above the level of your heart while you are sitting or lying down, if possible.  GET HELP IF:  · You got a tetanus shot and you have any of these problems at the injection site:    Swelling.    Very bad pain.    Redness.    Bleeding.  · You have a fever.  · A wound that was  closed breaks open.  · You notice a bad smell coming from your wound or your bandage.  · You notice something coming out of the wound, such as wood or glass.  · Medicine does not help your pain.  · You have more redness, swelling, or pain at the site of your wound.  · You have fluid, blood, or pus coming from your wound.  · You notice a change in the color of your skin near your wound.  · You need to change the bandage often because fluid, blood, or pus is coming from the wound.  · You start to have a new rash.  · You start to have numbness around the wound.  GET HELP RIGHT AWAY IF:  · You have very bad swelling around the wound.  · Your pain suddenly gets worse and is very bad.  · You notice painful lumps near the wound or on skin that is anywhere on your body.  · You have a red streak going away from your wound.  · The wound is on your hand or foot and you cannot move a finger or toe like you usually can.  · The wound is on your hand or foot and you notice that your fingers or toes look pale or bluish.     This information is not intended to replace advice given to you by your health care provider. Make sure you discuss any questions you have with your health care provider.     Document Released: 05/22/2008 Document Revised: 04/20/2015 Document Reviewed: 11/30/2014  Elsevier Interactive Patient Education ©2016 [employer]

## 2016-07-08 NOTE — ED Notes (Signed)
Left middle finger injury today/  Patient was wearing work gloves at the time, wind caught a door and slammed it on finger.  Laceration to posterior side of finger.

## 2016-07-09 NOTE — ED Provider Notes (Signed)
CSN: [id number]     Arrival date & time 07/08/16  1441 History   First MD Initiated Contact with Patient 07/08/16 1537     Chief Complaint  Patient presents with  . Hand Pain   (Consider location/radiation/quality/duration/timing/severity/associated sxs/prior Treatment) HPI 75 Y/O M HAD DOOR SLAM ONTO FINGER ABOUT 1 HOUR PRIOR TO ARRIVAL BLEEDING CONTROLLED WITH PRESSURE, PAIN SCORE 2,  Past Medical History:  Diagnosis Date  . Atrial fibrillation [facility])    s/p Cox Maze 1/09;  Multaq Rx d/c'd in 2014 due to pulmo fibrosis;  coumadin d/c'd in 2014 due to spontaneous subdural hematoma  . BPH (benign prostatic hyperplasia)   . CAD (coronary artery disease), native coronary artery    s/p CABG 12/2007;  Myoview 12/2011: EF 66%, no scar or ischemia; normal.  . DM (diabetes mellitus) ([place])   . Hyperlipidemia type II   . Hypertension   . OSA (obstructive sleep apnea)   . Pacemaker    PPM - St. Jude  . 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 Subdural hematoma ([place]) 07/2012   spontaneous;  coumadin d/c'd => no longer a candidate for anticoagulation   Past Surgical History:  Procedure Laterality Date  . APPENDECTOMY    . CHOLECYSTECTOMY    . CORONARY ARTERY BYPASS GRAFT     x3 (left internal mammary artery to distal left anterior descending coronary artery, saphenous vain graft to second circumflex marginal branch, saphenous vain graft to posterior descending coronary artery, endoscopic saphenous vain harvest from right thigh) and modified Cox - Maze IV procedure.  [name],MD. Electronically signed CHO/MEDQ D: 01/09/2008/ JOB: [id number] cc:  [name]  . [name]  07/30/2012   Procedure: CRANIOTOMY HEMATOMA EVACUATION SUBDURAL;  Surgeon: [name], MD;  Location: [facility];  Service: Neurosurgery;  Laterality: Right;  Right craniotomy for evacuation of subdural hematoma  . FOOT SURGERY    . HERNIA REPAIR    . ORCHIECTOMY     Left  /  testicular cancer  .  PACEMAKER PLACEMENT     PPM - St. Jude   Family History  Problem Relation Age of Onset  . Heart disease Father   . Heart attack Father   . Heart failure Father   . Heart disease Mother   . Alzheimer's disease Mother    Social History  Substance Use Topics  . Smoking status: Former Smoker    Quit date: 02/21/1991  . Smokeless tobacco: Not on file  . Alcohol use No    Review of Systems  Denies: HEADACHE, NAUSEA, ABDOMINAL PAIN, CHEST PAIN, CONGESTION, DYSURIA, SHORTNESS OF BREATH  Allergies  Review of patient's allergies indicates no known allergies.  Home Medications   Prior to Admission medications   Medication Sig Start Date End Date Taking? Authorizing Provider  colchicine 0.6 MG tablet TAKE 1 TABLET 3 TIMES A DAY AS DIRECTED 05/13/15   Historical Provider, MD  CRESTOR 40 MG tablet TAKE 1 TABLET BY MOUTH EVERY DAY 11/02/15   [name], MD  furosemide (LASIX) 40 MG tablet Take 40 mg by mouth daily.     Historical Provider, MD  lisinopril (PRINIVIL,ZESTRIL) 2.5 MG tablet Take 1 tablet by mouth daily. 05/03/14   Historical Provider, MD  metoprolol (LOPRESSOR) 50 MG tablet TAKE 1 TABLET TWICE DAILY 09/27/15   [name], MD  omega-3 acid ethyl esters (LOVAZA) 1 G capsule Take 1 g by mouth 3 (three) times daily.  Historical Provider, MD  oxybutynin (DITROPAN-XL) 10 MG 24 hr tablet Take 10 mg by mouth daily.  04/07/13   Historical Provider, MD  potassium chloride SA (K-DUR,KLOR-CON) 20 MEQ tablet Take 20 mEq by mouth daily.      Historical Provider, MD  sertraline (ZOLOFT) 100 MG tablet Take 100 mg by mouth daily.    Historical Provider, MD  Tamsulosin HCl (FLOMAX) 0.4 MG CAPS Take 0.4 mg by mouth at bedtime.     Historical Provider, MD   Meds Ordered and Administered this Visit  Medications - No data to display  BP 101/65 (BP Location: Right Arm)   Pulse 69   Temp 97.7 F (36.5 C) (Oral)   Resp 20   SpO2 96%  No data found.   Physical Exam NURSES NOTES AND  VITAL SIGNS REVIEWED. CONSTITUTIONAL: Well developed, well nourished, no acute distress HEENT: normocephalic, atraumatic EYES: Conjunctiva normal NECK:normal ROM, supple, no adenopathy PULMONARY:No respiratory distress, normal effort ABDOMINAL: Soft, ND, NT BS+, No CVAT MUSCULOSKELETAL: Normal ROM of all extremities, LEFT MIDDLE FINGER, THERE IS A V SHAPED LACERATION AT THE DIP. FULL EXTENSION AND FLEXION PRESENT, SENSATION INTACT.  SKIN: warm and dry without rash PSYCHIATRIC: Mood and affect, behavior are normal  Urgent Care Course   Clinical Course    .Marland KitchenLaceration Repair Date/Time: 07/09/2016 9:39 AM Performed by: Konrad Felix Authorized by: Ihor Gully D   Consent:    Consent obtained:  Verbal   Consent given by:  Patient   Risks discussed:  Infection and pain Anesthesia (see MAR for exact dosages):    Anesthesia method:  Local infiltration   Local anesthetic:  Lidocaine 1% w/o epi Laceration details:    Location:  Finger   Finger location:  L long finger   Length (cm):  1.5 Repair type:    Repair type:  Simple Pre-procedure details:    Preparation:  Patient was prepped and draped in usual sterile fashion Exploration:    Hemostasis achieved with:  Direct pressure   Contaminated: no   Treatment:    Area cleansed with:  Saline and Shur-Clens   Amount of cleaning:  Standard   Irrigation solution:  Tap water   Visualized foreign bodies/material removed: no   Skin repair:    Repair method:  Sutures   Suture size:  5-0   Suture material:  Prolene   Suture technique:  Simple interrupted   Number of sutures:  6 Approximation:    Approximation:  Close   Vermilion border: well-aligned   Post-procedure details:    Dressing:  Antibiotic ointment and non-adherent dressing   Patient tolerance of procedure:  Tolerated well, no immediate complications   (including critical care time)  Labs Review Labs Reviewed - No data to display  Imaging Review No results  found.   Visual Acuity Review  Right Eye Distance:   Left Eye Distance:   Bilateral Distance:    Right Eye Near:   Left Eye Near:    Bilateral Near:         MDM   1. Finger laceration, initial encounter     Patient is reassured that there are no issues that require transfer to higher level of care at this time or additional tests. Patient is advised to continue home symptomatic treatment. Patient is advised that if there are new or worsening symptoms to attend the emergency department, contact primary care provider, or return to UC. Instructions of care provided discharged home in stable condition.  THIS NOTE WAS GENERATED USING A VOICE RECOGNITION SOFTWARE PROGRAM. ALL REASONABLE EFFORTS  WERE MADE TO PROOFREAD THIS DOCUMENT FOR ACCURACY.  I have verbally reviewed the discharge instructions with the patient. A printed AVS was given to the patient.  All questions were answered prior to discharge.      Konrad Felix, [place] 07/09/16 [phone]

## 2016-07-15 ENCOUNTER — Encounter (HOSPITAL_COMMUNITY): Payer: Self-pay | Admitting: *Deleted

## 2016-07-15 ENCOUNTER — Ambulatory Visit (HOSPITAL_COMMUNITY)
Admission: EM | Admit: 2016-07-15 | Discharge: 2016-07-15 | Disposition: A | Discharge status: Home / Self Care | Payer: Medicare Other | Attending: Emergency Medicine | Admitting: Emergency Medicine

## 2016-07-15 DIAGNOSIS — Z4802 Encounter for removal of sutures: Secondary | ICD-10-CM

## 2016-07-15 NOTE — ED Provider Notes (Signed)
CSN: [id number]     Arrival date & time 07/15/16  1332 History   None    Chief Complaint  Patient presents with  . Suture / Staple Removal   (Consider location/radiation/quality/duration/timing/severity/associated sxs/prior Treatment) Patient presents today for suture removal. Sutures were placed 6 days ago here in [facility] for a finger laceration. Patient denies pain. He is also afebrile.     Suture / Staple Removal     Past Medical History:  Diagnosis Date  . Atrial fibrillation [facility])    s/p Cox Maze 1/09;  Multaq Rx d/c'd in 2014 due to pulmo fibrosis;  coumadin d/c'd in 2014 due to spontaneous subdural hematoma  . BPH (benign prostatic hyperplasia)   . CAD (coronary artery disease), native coronary artery    s/p CABG 12/2007;  Myoview 12/2011: EF 66%, no scar or ischemia; normal.  . DM (diabetes mellitus) ([place])   . Hyperlipidemia type II   . Hypertension   . OSA (obstructive sleep apnea)   . Pacemaker    PPM - St. Jude  . 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 Subdural hematoma ([place]) 07/2012   spontaneous;  coumadin d/c'd => no longer a candidate for anticoagulation   Past Surgical History:  Procedure Laterality Date  . APPENDECTOMY    . CHOLECYSTECTOMY    . CORONARY ARTERY BYPASS GRAFT     x3 (left internal mammary artery to distal left anterior descending coronary artery, saphenous vain graft to second circumflex marginal branch, saphenous vain graft to posterior descending coronary artery, endoscopic saphenous vain harvest from right thigh) and modified Cox - Maze IV procedure.  [name],MD. Electronically signed CHO/MEDQ D: 01/09/2008/ JOB: [id number] cc:  [name]  . [name]  07/30/2012   Procedure: CRANIOTOMY HEMATOMA EVACUATION SUBDURAL;  Surgeon: [name], MD;  Location: [place] [facility];  Service: Neurosurgery;  Laterality: Right;  Right craniotomy for evacuation of subdural hematoma  . FOOT SURGERY    . HERNIA REPAIR    .  ORCHIECTOMY     Left  /  testicular cancer  . PACEMAKER PLACEMENT     PPM - St. Jude   Family History  Problem Relation Age of Onset  . Heart disease Father   . Heart attack Father   . Heart failure Father   . Heart disease Mother   . Alzheimer's disease Mother    Social History  Substance Use Topics  . Smoking status: Former Smoker    Quit date: 02/21/1991  . Smokeless tobacco: Not on file  . Alcohol use No    Review of Systems  Constitutional: Negative.   Skin:       Sutures present on left 3rd distal digit    Allergies  Review of patient's allergies indicates no known allergies.  Home Medications   Prior to Admission medications   Medication Sig Start Date End Date Taking? Authorizing Provider  colchicine 0.6 MG tablet TAKE 1 TABLET 3 TIMES A DAY AS DIRECTED 05/13/15   Historical Provider, MD  CRESTOR 40 MG tablet TAKE 1 TABLET BY MOUTH EVERY DAY 11/02/15   [name], MD  furosemide (LASIX) 40 MG tablet Take 40 mg by mouth daily.     Historical Provider, MD  lisinopril (PRINIVIL,ZESTRIL) 2.5 MG tablet Take 1 tablet by mouth daily. 05/03/14   Historical Provider, MD  metoprolol (LOPRESSOR) 50 MG tablet TAKE 1 TABLET TWICE DAILY 09/27/15   [name], MD  omega-3 acid ethyl esters (LOVAZA) 1 G capsule Take 1 g by mouth 3 (three) times daily.     Historical Provider, MD  oxybutynin (DITROPAN-XL) 10 MG 24 hr tablet Take 10 mg by mouth daily.  04/07/13   Historical Provider, MD  potassium chloride SA (K-DUR,KLOR-CON) 20 MEQ tablet Take 20 mEq by mouth daily.      Historical Provider, MD  sertraline (ZOLOFT) 100 MG tablet Take 100 mg by mouth daily.    Historical Provider, MD  Tamsulosin HCl (FLOMAX) 0.4 MG CAPS Take 0.4 mg by mouth at bedtime.     Historical Provider, MD   Meds Ordered and Administered this Visit  Medications - No data to display  BP 107/72 (BP Location: Left Arm)   Pulse 67   Temp 97.8 F (36.6 C) (Oral)   Resp 16   SpO2 96%  No data  found.   Physical Exam  Constitutional: He appears well-developed and well-nourished.  Skin:  6 interrupted simple sutures present on left 3rd distal digit.     Urgent Care Course   Clinical Course    Procedures (including critical care time)  Labs Review Labs Reviewed - No data to display  Imaging Review No results found.   Visual Acuity Review  Right Eye Distance:   Left Eye Distance:   Bilateral Distance:    Right Eye Near:   Left Eye Near:    Bilateral Near:         MDM   1. Visit for suture removal    Laceration is healing. All 6 sutures were removed. Steri strips applied. Patient instructed to keep the wound clean. Instructed to follow up as needed.     Barry Dienes, NP 07/15/16 1626

## 2016-07-31 DIAGNOSIS — E7800 Pure hypercholesterolemia, unspecified: Secondary | ICD-10-CM | POA: Diagnosis not present

## 2016-08-01 ENCOUNTER — Other Ambulatory Visit: Payer: Self-pay | Admitting: *Deleted

## 2016-08-01 MED ORDER — ROSUVASTATIN CALCIUM 40 MG PO TABS: 40.0000 mg | ORAL_TABLET | Freq: Every day | ORAL | 1 refills | Status: DC

## 2016-08-01 NOTE — Telephone Encounter (Signed)
Per pharmacy call, patient would like this rx changed to a ninety day supply.

## 2016-08-07 DIAGNOSIS — E7800 Pure hypercholesterolemia, unspecified: Secondary | ICD-10-CM | POA: Diagnosis not present

## 2016-08-14 DIAGNOSIS — H2512 Age-related nuclear cataract, left eye: Secondary | ICD-10-CM | POA: Diagnosis not present

## 2016-08-14 DIAGNOSIS — H18412 Arcus senilis, left eye: Secondary | ICD-10-CM | POA: Diagnosis not present

## 2016-08-14 DIAGNOSIS — H18411 Arcus senilis, right eye: Secondary | ICD-10-CM | POA: Diagnosis not present

## 2016-08-14 DIAGNOSIS — H2511 Age-related nuclear cataract, right eye: Secondary | ICD-10-CM | POA: Diagnosis not present

## 2016-08-15 ENCOUNTER — Other Ambulatory Visit: Payer: Self-pay

## 2016-08-15 ENCOUNTER — Encounter: Payer: Medicare Other | Admitting: Internal Medicine

## 2016-08-31 ENCOUNTER — Telehealth: Payer: Self-pay | Admitting: *Deleted

## 2016-08-31 NOTE — Telephone Encounter (Signed)
Clearance from a cardiac standpoint for pt to have cataract extraction w/intraocular lens implantation of the left eye followed by the right eye faxed to the number provided.

## 2016-09-04 DIAGNOSIS — H2512 Age-related nuclear cataract, left eye: Secondary | ICD-10-CM | POA: Diagnosis not present

## 2016-09-05 DIAGNOSIS — K429 Umbilical hernia without obstruction or gangrene: Secondary | ICD-10-CM | POA: Diagnosis not present

## 2016-09-05 DIAGNOSIS — H2511 Age-related nuclear cataract, right eye: Secondary | ICD-10-CM | POA: Diagnosis not present

## 2016-09-05 DIAGNOSIS — D649 Anemia, unspecified: Secondary | ICD-10-CM | POA: Diagnosis not present

## 2016-09-05 DIAGNOSIS — R140 Abdominal distension (gaseous): Secondary | ICD-10-CM | POA: Diagnosis not present

## 2016-09-05 DIAGNOSIS — R1084 Generalized abdominal pain: Secondary | ICD-10-CM | POA: Diagnosis not present

## 2016-09-05 DIAGNOSIS — K219 Gastro-esophageal reflux disease without esophagitis: Secondary | ICD-10-CM | POA: Diagnosis not present

## 2016-09-06 ENCOUNTER — Other Ambulatory Visit: Payer: Self-pay | Admitting: Internal Medicine

## 2016-09-06 DIAGNOSIS — R1084 Generalized abdominal pain: Secondary | ICD-10-CM

## 2016-09-08 ENCOUNTER — Ambulatory Visit
Admission: RE | Admit: 2016-09-08 | Discharge: 2016-09-08 | Disposition: A | Discharge status: Home / Self Care | Payer: Medicare Other | Source: Ambulatory Visit | Attending: Internal Medicine | Admitting: Internal Medicine

## 2016-09-08 DIAGNOSIS — R1084 Generalized abdominal pain: Secondary | ICD-10-CM

## 2016-09-08 DIAGNOSIS — K6389 Other specified diseases of intestine: Secondary | ICD-10-CM | POA: Diagnosis not present

## 2016-09-08 MED ORDER — IOPAMIDOL (ISOVUE-300) INJECTION 61%
100.0000 mL | Freq: Once | INTRAVENOUS | Status: AC | PRN
Start: 2016-09-08 — End: 2016-09-08
  Administered 2016-09-08: 100 mL via INTRAVENOUS

## 2016-09-12 DIAGNOSIS — M6200 Separation of muscle (nontraumatic), unspecified site: Secondary | ICD-10-CM | POA: Diagnosis not present

## 2016-09-12 DIAGNOSIS — K529 Noninfective gastroenteritis and colitis, unspecified: Secondary | ICD-10-CM | POA: Diagnosis not present

## 2016-09-12 DIAGNOSIS — K429 Umbilical hernia without obstruction or gangrene: Secondary | ICD-10-CM | POA: Diagnosis not present

## 2016-09-12 DIAGNOSIS — D649 Anemia, unspecified: Secondary | ICD-10-CM | POA: Diagnosis not present

## 2016-09-19 ENCOUNTER — Encounter: Payer: Self-pay | Admitting: Internal Medicine

## 2016-09-19 ENCOUNTER — Ambulatory Visit (INDEPENDENT_AMBULATORY_CARE_PROVIDER_SITE_OTHER): Payer: Medicare Other | Admitting: Internal Medicine

## 2016-09-19 VITALS — BP 120/76 | HR 70 | Ht 68.0 in | Wt 227.0 lb

## 2016-09-19 DIAGNOSIS — Z950 Presence of cardiac pacemaker: Secondary | ICD-10-CM | POA: Diagnosis not present

## 2016-09-19 DIAGNOSIS — I2510 Atherosclerotic heart disease of native coronary artery without angina pectoris: Secondary | ICD-10-CM | POA: Diagnosis not present

## 2016-09-19 DIAGNOSIS — I480 Paroxysmal atrial fibrillation: Secondary | ICD-10-CM | POA: Diagnosis not present

## 2016-09-19 LAB — CUP PACEART INCLINIC DEVICE CHECK
Battery Impedance: 2800 Ohm
Battery Voltage: 2.75 V
Date Time Interrogation Session: 20171003161214
Implantable Lead Implant Date: 20090420
Implantable Lead Implant Date: 20090420
Implantable Lead Location: 753859
Implantable Lead Location: 753860
Lead Channel Impedance Value: 447 Ohm
Lead Channel Impedance Value: 566 Ohm
Lead Channel Pacing Threshold Amplitude: 1.25 V
Lead Channel Pacing Threshold Amplitude: 7.2 V
Lead Channel Pacing Threshold Pulse Width: 0.5 ms
Lead Channel Sensing Intrinsic Amplitude: 0.625 mV
Lead Channel Sensing Intrinsic Amplitude: 0.8 mV
Lead Channel Setting Pacing Amplitude: 2.5 V
Lead Channel Setting Pacing Pulse Width: 0.5 ms
Lead Channel Setting Sensing Sensitivity: 2 mV
Pulse Gen Model: 5826
Pulse Gen Serial Number: 2109558

## 2016-09-19 NOTE — Progress Notes (Signed)
HPI [name] returns today for followup. He is a pleasant 75 yo man with a h/o HTN, symptomatic bradycardia due to sinus node dysfunction. He is s/p PPM insertion. He denies chest pain or sob at rest. He does note dyspnea with exertion, especially with going up stairs. He has had mild peripheral edema. He is no longer on any anti-coagulation after a subdural hematoma. No Known Allergies   Current Outpatient Prescriptions  Medication Sig Dispense Refill  . allopurinol (ZYLOPRIM) 300 MG tablet Take 1 tablet by mouth daily.    . colchicine 0.6 MG tablet TAKE 1 TABLET 3 TIMES A DAY AS DIRECTED  1  . furosemide (LASIX) 40 MG tablet Take 40 mg by mouth daily.     [name] lisinopril (PRINIVIL,ZESTRIL) 2.5 MG tablet Take 1 tablet by mouth daily.    . metoprolol (LOPRESSOR) 50 MG tablet Take 50 mg by mouth 2 (two) times daily.    [name] omega-3 acid ethyl esters (LOVAZA) 1 G capsule Take 1 g by mouth 3 (three) times daily.     [name] oxybutynin (DITROPAN-XL) 10 MG 24 hr tablet Take 10 mg by mouth daily.     . potassium chloride SA (K-DUR,KLOR-CON) 20 MEQ tablet Take 20 mEq by mouth daily.      . rosuvastatin (CRESTOR) 40 MG tablet Take 1 tablet (40 mg total) by mouth daily. 90 tablet 1  . sertraline (ZOLOFT) 100 MG tablet Take 100 mg by mouth daily.    . Tamsulosin HCl (FLOMAX) 0.4 MG CAPS Take 0.4 mg by mouth at bedtime.      No current facility-administered medications for this visit.      Past Medical History:  Diagnosis Date  . Atrial fibrillation [facility])    s/p Cox Maze 1/09;  Multaq Rx d/c'd in 2014 due to pulmo fibrosis;  coumadin d/c'd in 2014 due to spontaneous subdural hematoma  . BPH (benign prostatic hyperplasia)   . CAD (coronary artery disease), native coronary artery    s/p CABG 12/2007;  Myoview 12/2011: EF 66%, no scar or ischemia; normal.  . DM (diabetes mellitus) ([place])   . Hyperlipidemia type II   . Hypertension   . OSA (obstructive sleep apnea)   . Pacemaker    PPM - St. Jude  .  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 Subdural hematoma ([place]) 07/2012   spontaneous;  coumadin d/c'd => no longer a candidate for anticoagulation    ROS:   All systems reviewed and negative except as noted in the HPI.   Past Surgical History:  Procedure Laterality Date  . APPENDECTOMY    . CHOLECYSTECTOMY    . CORONARY ARTERY BYPASS GRAFT     x3 (left internal mammary artery to distal left anterior descending coronary artery, saphenous vain graft to second circumflex marginal branch, saphenous vain graft to posterior descending coronary artery, endoscopic saphenous vain harvest from right thigh) and modified Cox - Maze IV procedure.  [name],MD. Electronically signed CHO/MEDQ D: 01/09/2008/ JOB: [id number] cc:  [name]  . [name]  07/30/2012   Procedure: CRANIOTOMY HEMATOMA EVACUATION SUBDURAL;  Surgeon: [name], MD;  Location: [place] [facility];  Service: Neurosurgery;  Laterality: Right;  Right craniotomy for evacuation of subdural hematoma  . FOOT SURGERY    . HERNIA REPAIR    . ORCHIECTOMY     Left  /  testicular cancer  . PACEMAKER PLACEMENT     PPM - St. Jude  Family History  Problem Relation Age of Onset  . Heart disease Father   . Heart attack Father   . Heart failure Father   . Heart disease Mother   . Alzheimer's disease Mother      Social History   Social History  . Marital status: Married    Spouse name: N/A  . Number of children: N/A  . Years of education: N/A   Occupational History  . Not on file.   Social History Main Topics  . Smoking status: Former Smoker    Quit date: 02/21/1991  . Smokeless tobacco: Never Used  . Alcohol use No  . Drug use: No  . Sexual activity: Not Currently   Other Topics Concern  . 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  He is a Engineer, structural.       There were no vitals taken for this visit.  Physical Exam:  Well appearing, obese, 75 year old man,  NAD HEENT: Unremarkable Neck:  7 cm JVD, no thyromegally Back:  No CVA tenderness Lungs:  Clear with no wheezes, rales, or rhonchi. Well healed PPM incision. HEART:  Regular rate rhythm, no murmurs, no rubs, no clicks Abd:  soft, obese, positive bowel sounds, no organomegally, no rebound, no guarding Ext:  2 plus pulses, no edema, no cyanosis, no clubbing Skin:  No rashes no nodules Neuro:  CN II through XII intact, motor grossly intact  DEVICE  Normal device function.  See PaceArt for details.   Assess/Plan: 1. Sinus node dysfunction - he is stable after PPM 2. PAF - he has had lots of very brief episodes. He is not a candidate for systemic anti-coag. 3. PPM - his St. Jude DDD PM is working normally. Will recheck in several months.  Mikle Bosworth.D.

## 2016-09-19 NOTE — Patient Instructions (Signed)
Medication Instructions:  Your physician recommends that you continue on your current medications as directed. Please refer to the Current Medication list given to you today.   Labwork: None ordered   Testing/Procedures: None ordered   Follow-Up:  Your physician wants you to follow-up in: 6 months with device clinic and 12 months with Dr Knox Saliva will receive a reminder letter in the mail two months in advance. If you don't receive a letter, please call our office to schedule the follow-up appointment.   Any Other Special Instructions Will Be Listed Below (If Applicable).     If you need a refill on your cardiac medications before your next appointment, please call your pharmacy.

## 2016-09-22 DIAGNOSIS — H2511 Age-related nuclear cataract, right eye: Secondary | ICD-10-CM | POA: Diagnosis not present

## 2016-09-27 DIAGNOSIS — Z23 Encounter for immunization: Secondary | ICD-10-CM | POA: Diagnosis not present

## 2016-09-27 DIAGNOSIS — M109 Gout, unspecified: Secondary | ICD-10-CM | POA: Diagnosis not present

## 2016-09-27 DIAGNOSIS — M1990 Unspecified osteoarthritis, unspecified site: Secondary | ICD-10-CM | POA: Diagnosis not present

## 2016-09-27 DIAGNOSIS — M79671 Pain in right foot: Secondary | ICD-10-CM | POA: Diagnosis not present

## 2016-10-04 ENCOUNTER — Encounter: Payer: Medicare Other | Admitting: Internal Medicine

## 2016-10-16 ENCOUNTER — Other Ambulatory Visit: Payer: Self-pay | Admitting: Cardiology

## 2016-10-25 DIAGNOSIS — M79671 Pain in right foot: Secondary | ICD-10-CM | POA: Diagnosis not present

## 2016-10-25 DIAGNOSIS — L409 Psoriasis, unspecified: Secondary | ICD-10-CM | POA: Diagnosis not present

## 2016-10-25 DIAGNOSIS — M1990 Unspecified osteoarthritis, unspecified site: Secondary | ICD-10-CM | POA: Diagnosis not present

## 2016-10-25 DIAGNOSIS — R5383 Other fatigue: Secondary | ICD-10-CM | POA: Diagnosis not present

## 2016-10-25 DIAGNOSIS — M109 Gout, unspecified: Secondary | ICD-10-CM | POA: Diagnosis not present

## 2016-11-08 DIAGNOSIS — L409 Psoriasis, unspecified: Secondary | ICD-10-CM | POA: Diagnosis not present

## 2016-11-08 DIAGNOSIS — M1990 Unspecified osteoarthritis, unspecified site: Secondary | ICD-10-CM | POA: Diagnosis not present

## 2016-11-08 DIAGNOSIS — D696 Thrombocytopenia, unspecified: Secondary | ICD-10-CM | POA: Diagnosis not present

## 2016-11-08 DIAGNOSIS — M109 Gout, unspecified: Secondary | ICD-10-CM | POA: Diagnosis not present

## 2016-11-08 DIAGNOSIS — M79671 Pain in right foot: Secondary | ICD-10-CM | POA: Diagnosis not present

## 2016-11-15 DIAGNOSIS — D696 Thrombocytopenia, unspecified: Secondary | ICD-10-CM | POA: Diagnosis not present

## 2016-11-15 DIAGNOSIS — E119 Type 2 diabetes mellitus without complications: Secondary | ICD-10-CM | POA: Diagnosis not present

## 2016-11-15 DIAGNOSIS — R269 Unspecified abnormalities of gait and mobility: Secondary | ICD-10-CM | POA: Diagnosis not present

## 2016-11-21 ENCOUNTER — Encounter: Payer: Self-pay | Admitting: Hematology and Oncology

## 2016-11-21 ENCOUNTER — Telehealth: Payer: Self-pay | Admitting: Hematology and Oncology

## 2016-11-21 NOTE — Telephone Encounter (Signed)
Pt confirmed appt, verified demo and insurance, mailed pt letter, faxed referring provider appt. Date/time

## 2016-11-23 DIAGNOSIS — H353111 Nonexudative age-related macular degeneration, right eye, early dry stage: Secondary | ICD-10-CM | POA: Diagnosis not present

## 2016-11-23 DIAGNOSIS — H353121 Nonexudative age-related macular degeneration, left eye, early dry stage: Secondary | ICD-10-CM | POA: Diagnosis not present

## 2016-11-27 ENCOUNTER — Encounter: Payer: Self-pay | Admitting: Hematology and Oncology

## 2016-11-27 ENCOUNTER — Telehealth: Payer: Self-pay | Admitting: Hematology and Oncology

## 2016-11-27 ENCOUNTER — Ambulatory Visit (HOSPITAL_BASED_OUTPATIENT_CLINIC_OR_DEPARTMENT_OTHER): Payer: Medicare Other | Admitting: Hematology and Oncology

## 2016-11-27 VITALS — BP 108/50 | HR 58 | Temp 98.3°F | Resp 18 | Ht 68.0 in | Wt 225.9 lb

## 2016-11-27 DIAGNOSIS — J8410 Pulmonary fibrosis, unspecified: Secondary | ICD-10-CM | POA: Diagnosis not present

## 2016-11-27 DIAGNOSIS — D696 Thrombocytopenia, unspecified: Secondary | ICD-10-CM | POA: Diagnosis not present

## 2016-11-27 DIAGNOSIS — Z8547 Personal history of malignant neoplasm of testis: Secondary | ICD-10-CM | POA: Diagnosis not present

## 2016-11-27 DIAGNOSIS — K760 Fatty (change of) liver, not elsewhere classified: Secondary | ICD-10-CM

## 2016-11-27 NOTE — Progress Notes (Signed)
[place] NOTE  Patient Care Team: [name], MD as PCP - General  CHIEF COMPLAINTS/PURPOSE OF CONSULTATION:  Chronic thrombocytopenia  HISTORY OF PRESENTING ILLNESS:  [name]. 75 y.o. male is here because of thrombocytopenia. 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  He was found to have abnormal CBC from abnormal blood work from routine blood work monitoring. I have reviewed outside records from his primary care doctor.  He is noted to have mild pancytopenia since 11/17/2015. On 11/17/2015, white blood cell count 5.9, hemoglobin 12.7 and platelet count 106 On 03/13/2016, white blood cell count 6.6, hemoglobin 12.6 and platelet count 103 On 09/06/2015, white blood cell count 6.6, hemoglobin 11.9 and platelet count 102 On 10/25/2016, white count 5.2, hemoglobin 12.7 and platelet count 86 On 11/08/16, white blood cell count 5.3, hemoglobin 12.2 implant, 107. On 09/05/2016, serum ferritin was high at 220, iron study showed serum iron low at 24 and 9% iron saturation.  Some older scanned records dated back to 12/13/2011 showed low platelet count of 101 On 12/21/2011, he had normal platelet count of 210 On 07/30/2012, his platelet count was borderline low at 143  On 09/08/2016, [name], such as spontaneous epistaxis, hematuria, [name]; however, based on his prior extensive surgical history, he probably had  transfusion support in the past  MEDICAL HISTORY:  Past Medical History:  Diagnosis Date  . Atrial fibrillation [facility])    s/p Cox Maze 1/09;  Multaq Rx d/c'd in 2014 due to pulmo fibrosis;  coumadin d/c'd in 2014 due to spontaneous subdural hematoma  . BPH (benign prostatic hyperplasia)   . CAD (coronary artery disease), native coronary artery    s/p CABG 12/2007;  Myoview 12/2011: EF 66%, no scar or ischemia; normal.  . DM (diabetes mellitus) ([place])   . Hyperlipidemia type II   . Hypertension   . OSA (obstructive sleep apnea)   . Pacemaker    PPM - St. Jude  . 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 Subdural hematoma ([place]) 07/2012   spontaneous;  coumadin d/c'd => no longer a candidate for anticoagulation    SURGICAL HISTORY: Past Surgical History:  Procedure Laterality Date  . APPENDECTOMY    . CHOLECYSTECTOMY    . CORONARY ARTERY BYPASS GRAFT     x3 (left internal mammary artery to distal left anterior descending coronary artery, saphenous vain graft to second circumflex marginal branch, saphenous vain graft to posterior descending coronary artery, endoscopic saphenous vain harvest from right thigh) and modified Cox - Maze IV procedure.  [name],MD. Electronically signed CHO/MEDQ D: 01/09/2008/ JOB: [id number] cc:  [name]  . [name]  07/30/2012   Procedure: CRANIOTOMY HEMATOMA EVACUATION SUBDURAL;  Surgeon: [name], MD;  Location: [place] [facility];  Service: Neurosurgery;  Laterality: Right;  Right craniotomy for evacuation of subdural hematoma  . FOOT SURGERY    . HERNIA REPAIR    . ORCHIECTOMY  Left  /  testicular cancer  . PACEMAKER PLACEMENT     PPM - St. Jude    SOCIAL HISTORY: Social History   Social History  . Marital status: Married    Spouse name: [name]  . Number of children: 2  . Years of education: N/A   Occupational History  . retired IT trainer    Social History Main Topics  . Smoking status: Former Smoker    Quit date:  02/21/1991  . Smokeless tobacco: Never Used  . Alcohol use No  . Drug use: No  . Sexual activity: Not Currently   Other Topics Concern  . 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  He is a Engineer, structural.      FAMILY HISTORY: Family History  Problem Relation Age of Onset  . Heart disease Father   . Heart attack Father   . Heart failure Father   . Heart disease Mother   . Alzheimer's disease Mother     ALLERGIES:  has No Known Allergies.  MEDICATIONS:  Current Outpatient Prescriptions  Medication Sig Dispense Refill  . allopurinol (ZYLOPRIM) 300 MG tablet Take 1 tablet by mouth daily.    . colchicine 0.6 MG tablet TAKE 1 TABLET 3 TIMES A DAY AS DIRECTED  1  . furosemide (LASIX) 40 MG tablet Take 40 mg by mouth daily.     [name] lisinopril (PRINIVIL,ZESTRIL) 2.5 MG tablet Take 1 tablet by mouth daily.    . metoprolol (LOPRESSOR) 50 MG tablet TAKE 1 TABLET TWICE DAILY 180 tablet 1  . omega-3 acid ethyl esters (LOVAZA) 1 G capsule Take 1 g by mouth 3 (three) times daily.     [name] oxybutynin (DITROPAN-XL) 10 MG 24 hr tablet Take 10 mg by mouth daily.     . potassium chloride SA (K-DUR,KLOR-CON) 20 MEQ tablet Take 20 mEq by mouth daily.      . rosuvastatin (CRESTOR) 40 MG tablet Take 1 tablet (40 mg total) by mouth daily. 90 tablet 1  . sertraline (ZOLOFT) 100 MG tablet Take 100 mg by mouth daily.    . Tamsulosin HCl (FLOMAX) 0.4 MG CAPS Take 0.4 mg by mouth at bedtime.      No current facility-administered medications for this visit.     REVIEW OF SYSTEMS:   Constitutional: Denies fevers, chills or abnormal night sweats Eyes: Denies blurriness of vision, double vision or watery eyes Ears, nose, mouth, throat, and face: Denies mucositis or sore throat Respiratory: Denies cough, dyspnea or wheezes Cardiovascular: Denies palpitation, chest discomfort or lower extremity swelling Gastrointestinal:  Denies nausea, heartburn or change in bowel habits Skin: Denies  abnormal skin rashes Lymphatics: Denies new lymphadenopathy  Neurological:Denies numbness, tingling or new weaknesses Behavioral/Psych: Mood is stable, no new changes  All other systems were reviewed with the patient and are negative.  PHYSICAL EXAMINATION: ECOG PERFORMANCE STATUS: 1 - Symptomatic but completely ambulatory  Vitals:   11/27/16 1444  BP: (!) 108/50  Pulse: (!) 58  Resp: 18  Temp: 98.3 F (36.8 C)   Filed Weights   11/27/16 1444  Weight: 225 lb 14.4 oz (102.5 kg)    GENERAL:alert, no distress and comfortable. He is moderately obese SKIN: skin color, texture, turgor are normal, no rashes or significant lesions. Noted some easy bruising but no petechiae EYES: normal, conjunctiva are pink and non-injected, sclera clear OROPHARYNX:no exudate, no erythema and lips, buccal mucosa, and tongue normal  NECK: supple, thyroid normal size,  non-tender, without nodularity LYMPH:  no palpable lymphadenopathy in the cervical, axillary or inguinal LUNGS: clear to auscultation and percussion with normal breathing effort HEART: regular rate & rhythm and no murmurs and no lower extremity edema ABDOMEN:abdomen soft, non-tender and normal bowel sounds Musculoskeletal:no cyanosis of digits and no clubbing  PSYCH: alert & oriented x 3 with fluent speech NEURO: no focal motor/sensory deficits  LABORATORY DATA:  I have reviewed the data as listed Recent Results (from the past 2160 hour(s))  Implantable device check     Status: None   Collection Time: 09/19/16  7:54 PM  Result Value Ref Range   Date Time Interrogation Session [id number]    Pulse Generator Manufacturer [employer]    Pulse Gen Model 5826 Zephyr XL DR    Pulse Gen Serial Number [id number]    Implantable Pulse Generator Type Implantable Pulse Generator    Implantable Pulse Generator Implant Date [id number]    Implantable Lead Manufacturer [facility]    Implantable Lead Model 1642T IsoFlex S    Implantable Lead Serial  Number [id number]    Implantable Lead Implant Date [id number]    Implantable Lead Location [id number]    Implantable Lead Manufacturer [facility]    Implantable Lead Model 1646T IsoFlex S    Implantable Lead Serial Number [id number]    Implantable Lead Implant Date [id number]    Implantable Lead Location [id number]    Lead Channel Setting Sensing Sensitivity 2.0 mV   Lead Channel Setting Pacing Amplitude 2.50 V   Lead Channel Setting Pacing Pulse Width 0.5 ms   Lead Channel Impedance Value 447 ohm   Lead Channel Sensing Intrinsic Amplitude 0.8 mV   Lead Channel Pacing Threshold Amplitude 1.25 V   Lead Channel Pacing Threshold Pulse Width 0.5 ms   Lead Channel Impedance Value 566 ohm   Lead Channel Sensing Intrinsic Amplitude 0.625 mV   Lead Channel Pacing Threshold Amplitude 7.2 V   Battery Status Unknown    Battery Remaining Longevity 2.50 - 3.50 years    Battery Voltage 2.75 V   Battery Impedance 2,800.0 ohm   Eval Rhythm SB 34     RADIOGRAPHIC STUDIES:I have personally reviewed CT scan dated 09/08/16 I have personally reviewed the radiological images as listed and agreed with the findings in the report.   ASSESSMENT & PLAN Thrombocytopenia ([place]) The most likely cause of his chronic thrombocytopenia is 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 Even though the CT imaging report from September 2017 did not disclose this, I reviewed the imaging study with the patient extensively which clearly showed mild liver enlargement and splenomegaly. Rarely, autoimmune disorder that could cause pulmonary fibrosis can also cause mild thrombocytopenia. He is not symptomatic. I discussed with the patient the rationale of not pursuing additional workup at this point due to lack of symptoms. The patient is not at risk of excessive bleeding as long as his platelet count is greater than 50,000. After much discussion, he is in agreement to come back in 3 months with repeat blood work then.  Fatty liver  disease, nonalcoholic The patient is morbidly obese. Prior ultrasound abdomen and recent CT suggested fatty liver disease with possible early cirrhosis. I recommend increase exercise as activity, dietary modification and weight loss. The patient appears motivated to try to loose weight to approximately 220 pounds in the next 3 months.  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 He has significant shortness  of breath on minimal exertion with cough. Continue close monitoring. His vital signs at baseline appears stable with adequate oxygenation and no signs of hypoxia   Orders Placed This Encounter  Procedures  . CBC with Differential/Platelet    Standing Status:   Future    Standing Expiration Date:   01/01/2018  . Comprehensive metabolic panel    Standing Status:   Future    Standing Expiration Date:   01/01/2018  . Lactate dehydrogenase    Standing Status:   Future    Standing Expiration Date:   01/01/2018  . Vitamin B12    Standing Status:   Future    Standing Expiration Date:   01/01/2018  . Hepatitis C antibody (reflex if positive)    Standing Status:   Future    Standing Expiration Date:   01/01/2018  . ANA, IFA (with reflex)    Standing Status:   Future    Standing Expiration Date:   01/01/2018   All questions were answered. The patient knows to call the clinic with any problems, questions or concerns. No barriers to learning was detected. I spent 40 minutes counseling the patient face to face. The total time spent in the appointment was 55 minutes and more than 50% was on counseling and review of test results     [name], MD 11/28/2016 8:20 AM

## 2016-11-27 NOTE — Telephone Encounter (Signed)
Appointments scheduled per 11/27/16 los. A copy of the AVS report and appointment schedule was given to the patient, per 11/27/16 los.

## 2016-11-28 DIAGNOSIS — K760 Fatty (change of) liver, not elsewhere classified: Secondary | ICD-10-CM | POA: Insufficient documentation

## 2016-11-28 NOTE — Assessment & Plan Note (Signed)
The patient had recent CT scan which showed evidence of pulmonary fibrosis at the lung bases. He has significant shortness of breath on minimal exertion with cough. Continue close monitoring. His vital signs at baseline appears stable with adequate oxygenation and no signs of hypoxia

## 2016-11-28 NOTE — Assessment & Plan Note (Signed)
The patient is morbidly obese. Prior ultrasound abdomen and recent CT suggested fatty liver disease with possible early cirrhosis. I recommend increase exercise as activity, dietary modification and weight loss. The patient appears motivated to try to loose weight to approximately 220 pounds in the next 3 months.

## 2016-11-28 NOTE — Assessment & Plan Note (Signed)
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 Even though the CT imaging report from September 2017 did not disclose this, I reviewed the imaging study with the patient extensively which clearly showed mild liver enlargement and splenomegaly. Rarely, autoimmune disorder that could cause pulmonary fibrosis can also cause mild thrombocytopenia. He is not symptomatic. I discussed with the patient the rationale of not pursuing additional workup at this point due to lack of symptoms. The patient is not at risk of excessive bleeding as long as his platelet count is greater than 50,000. After much discussion, he is in agreement to come back in 3 months with repeat blood work then.

## 2016-12-13 DIAGNOSIS — L409 Psoriasis, unspecified: Secondary | ICD-10-CM | POA: Diagnosis not present

## 2016-12-13 DIAGNOSIS — M109 Gout, unspecified: Secondary | ICD-10-CM | POA: Diagnosis not present

## 2016-12-13 DIAGNOSIS — M1990 Unspecified osteoarthritis, unspecified site: Secondary | ICD-10-CM | POA: Diagnosis not present

## 2016-12-13 DIAGNOSIS — M79671 Pain in right foot: Secondary | ICD-10-CM | POA: Diagnosis not present

## 2017-02-05 DIAGNOSIS — L821 Other seborrheic keratosis: Secondary | ICD-10-CM | POA: Diagnosis not present

## 2017-02-05 DIAGNOSIS — D225 Melanocytic nevi of trunk: Secondary | ICD-10-CM | POA: Diagnosis not present

## 2017-02-05 DIAGNOSIS — L570 Actinic keratosis: Secondary | ICD-10-CM | POA: Diagnosis not present

## 2017-02-05 DIAGNOSIS — D485 Neoplasm of uncertain behavior of skin: Secondary | ICD-10-CM | POA: Diagnosis not present

## 2017-02-05 DIAGNOSIS — C44519 Basal cell carcinoma of skin of other part of trunk: Secondary | ICD-10-CM | POA: Diagnosis not present

## 2017-02-14 DIAGNOSIS — E119 Type 2 diabetes mellitus without complications: Secondary | ICD-10-CM | POA: Diagnosis not present

## 2017-02-14 DIAGNOSIS — R269 Unspecified abnormalities of gait and mobility: Secondary | ICD-10-CM | POA: Diagnosis not present

## 2017-02-14 DIAGNOSIS — D696 Thrombocytopenia, unspecified: Secondary | ICD-10-CM | POA: Diagnosis not present

## 2017-02-20 ENCOUNTER — Other Ambulatory Visit: Payer: Self-pay | Admitting: Cardiology

## 2017-02-20 DIAGNOSIS — R260 Ataxic gait: Secondary | ICD-10-CM | POA: Diagnosis not present

## 2017-02-22 DIAGNOSIS — R260 Ataxic gait: Secondary | ICD-10-CM | POA: Diagnosis not present

## 2017-02-23 ENCOUNTER — Other Ambulatory Visit (HOSPITAL_BASED_OUTPATIENT_CLINIC_OR_DEPARTMENT_OTHER): Payer: Medicare Other

## 2017-02-23 ENCOUNTER — Encounter: Payer: Self-pay | Admitting: Hematology and Oncology

## 2017-02-23 ENCOUNTER — Ambulatory Visit (HOSPITAL_BASED_OUTPATIENT_CLINIC_OR_DEPARTMENT_OTHER): Payer: Medicare Other | Admitting: Hematology and Oncology

## 2017-02-23 ENCOUNTER — Telehealth: Payer: Self-pay | Admitting: Hematology and Oncology

## 2017-02-23 VITALS — BP 100/70 | HR 60 | Temp 97.7°F | Resp 18 | Ht 68.0 in | Wt 224.9 lb

## 2017-02-23 DIAGNOSIS — D696 Thrombocytopenia, unspecified: Secondary | ICD-10-CM

## 2017-02-23 DIAGNOSIS — Z8547 Personal history of malignant neoplasm of testis: Secondary | ICD-10-CM

## 2017-02-23 DIAGNOSIS — J8410 Pulmonary fibrosis, unspecified: Secondary | ICD-10-CM

## 2017-02-23 DIAGNOSIS — IMO0001 Reserved for inherently not codable concepts without codable children: Secondary | ICD-10-CM

## 2017-02-23 DIAGNOSIS — R161 Splenomegaly, not elsewhere classified: Secondary | ICD-10-CM

## 2017-02-23 LAB — CBC WITH DIFFERENTIAL/PLATELET
BASO%: 0.9 % (ref 0.0–2.0)
Basophils Absolute: 0 10*3/uL (ref 0.0–0.1)
EOS%: 1.6 % (ref 0.0–7.0)
Eosinophils Absolute: 0.1 10*3/uL (ref 0.0–0.5)
HCT: 35.4 % — ABNORMAL LOW (ref 38.4–49.9)
HGB: 11.4 g/dL — ABNORMAL LOW (ref 13.0–17.1)
LYMPH%: 18.1 % (ref 14.0–49.0)
MCH: 29.1 pg (ref 27.2–33.4)
MCHC: 32.3 g/dL (ref 32.0–36.0)
MCV: 89.9 fL (ref 79.3–98.0)
MONO#: 0.2 10*3/uL (ref 0.1–0.9)
MONO%: 3.5 % (ref 0.0–14.0)
NEUT#: 3.4 10*3/uL (ref 1.5–6.5)
NEUT%: 75.9 % — ABNORMAL HIGH (ref 39.0–75.0)
Platelets: 89 10*3/uL — ABNORMAL LOW (ref 140–400)
RBC: 3.94 10*6/uL — ABNORMAL LOW (ref 4.20–5.82)
RDW: 18.5 % — ABNORMAL HIGH (ref 11.0–14.6)
WBC: 4.5 10*3/uL (ref 4.0–10.3)
lymph#: 0.8 10*3/uL — ABNORMAL LOW (ref 0.9–3.3)

## 2017-02-23 LAB — COMPREHENSIVE METABOLIC PANEL
ALT: 15 U/L (ref 0–55)
AST: 24 U/L (ref 5–34)
Albumin: 4.1 g/dL (ref 3.5–5.0)
Alkaline Phosphatase: 105 U/L (ref 40–150)
Anion Gap: 9 mEq/L (ref 3–11)
BUN: 18.3 mg/dL (ref 7.0–26.0)
CO2: 27 mEq/L (ref 22–29)
Calcium: 9.6 mg/dL (ref 8.4–10.4)
Chloride: 106 mEq/L (ref 98–109)
Creatinine: 1.2 mg/dL (ref 0.7–1.3)
EGFR: 60 mL/min/{1.73_m2} — ABNORMAL LOW (ref >90)
Glucose: 113 mg/dl (ref 70–140)
Potassium: 3.8 mEq/L (ref 3.5–5.1)
Sodium: 141 mEq/L (ref 136–145)
Total Bilirubin: 0.98 mg/dL (ref 0.20–1.20)
Total Protein: 8 g/dL (ref 6.4–8.3)

## 2017-02-23 LAB — LACTATE DEHYDROGENASE: LDH: 197 U/L (ref 125–245)

## 2017-02-23 NOTE — Assessment & Plan Note (Signed)
The patient is morbidly obese. He has fatty liver disease based on prior imaging. We discussed importance of weight loss and dietary modification

## 2017-02-23 NOTE — Telephone Encounter (Signed)
Appointments scheduled per 02/23/17 los. Patient was given a copy of the AVS report and appointment schedule per 02/23/17 los. °

## 2017-02-23 NOTE — Assessment & Plan Note (Signed)
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 Even though the CT imaging report from September 2017 did not disclose this, I reviewed the imaging study with the patient extensively which clearly showed mild liver enlargement and splenomegaly. Rarely, autoimmune disorder that could cause pulmonary fibrosis can also cause mild thrombocytopenia. He is not symptomatic. We discussed importance of weight loss as a way to preserve his liver function.  In my experience, I do see improvement of thrombocytopenia for patients with weight loss alone. For now, there is no excessive risk of bleeding as long as platelet count is greater than 50,000. I will see him back again in 5 months with repeat blood work and examination I will call him with results of serum vitamin B12 and autoimmune screen

## 2017-02-23 NOTE — Progress Notes (Signed)
[place] NOTE  Horatio Pel, MD SUMMARY OF HEMATOLOGIC HISTORY:  [name]. is here because of thrombocytopenia. 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  He was found to have abnormal CBC from abnormal blood work from routine blood work monitoring. I have reviewed outside records from his primary care doctor.  He is noted to have mild pancytopenia since 11/17/2015. On 11/17/2015, white blood cell count 5.9, hemoglobin 12.7 and platelet count 106 On 03/13/2016, white blood cell count 6.6, hemoglobin 12.6 and platelet count 103 On 09/06/2015, white blood cell count 6.6, hemoglobin 11.9 and platelet count 102 On 10/25/2016, white count 5.2, hemoglobin 12.7 and platelet count 86 On 11/08/16, white blood cell count 5.3, hemoglobin 12.2 implant, 107. On 09/05/2016, serum ferritin was high at 220, iron study showed serum iron low at 24 and 9% iron saturation.  Some older scanned records dated back to 12/13/2011 showed low platelet count of 101 On 12/21/2011, he had normal platelet count of 210 On 07/30/2012, his platelet count was borderline low at 143  On 09/08/2016, [name], such as spontaneous epistaxis, hematuria, [name]; however, based on his prior extensive surgical history, he probably had transfusion support in the past Overall impression is fatty liver disease secondary to poorly controlled  diabetes and morbid obesity as a cause of his thrombocytopenia INTERVAL HISTORY: Jospeh Di Kindle. 76 y.o. male returns for further follow-up. He is present today with his wife. From his previous visit, the patient was motivated to lose weight.  Today, he has not lost any weight at all.  According to his wife, the patient lives to eat and has a lot of desserts with his meals The patient denies any recent signs or symptoms of bleeding such as spontaneous epistaxis, hematuria or hematochezia.   I have reviewed the past medical history, past surgical history, social history and family history with the patient and they are unchanged from previous note.  ALLERGIES:  has No Known Allergies.  MEDICATIONS:  Current Outpatient Prescriptions  Medication Sig Dispense Refill  . allopurinol (ZYLOPRIM) 300 MG tablet Take 1 tablet by mouth daily.    . colchicine 0.6 MG tablet TAKE 1 TABLET 3 TIMES A DAY AS DIRECTED  1  . furosemide (LASIX) 40 MG tablet Take 40 mg by mouth daily.     [name] lisinopril (PRINIVIL,ZESTRIL) 2.5 MG tablet Take 1 tablet by mouth daily.    . metoprolol (LOPRESSOR) 50 MG tablet TAKE 1 TABLET TWICE DAILY 180 tablet 1  . omega-3 acid ethyl esters (LOVAZA) 1 G capsule Take 1 g by mouth 3 (three) times daily.     [name] oxybutynin (DITROPAN-XL) 10 MG 24 hr tablet Take 10 mg by mouth daily.     . pantoprazole (PROTONIX) 40 MG tablet Take 40 mg by mouth daily.    . potassium chloride SA (K-DUR,KLOR-CON) 20 MEQ tablet Take 20 mEq by mouth daily.      . rosuvastatin (CRESTOR) 40 MG  tablet TAKE 1 TABLET (40 MG TOTAL) BY MOUTH DAILY. 30 tablet 0  . sertraline (ZOLOFT) 100 MG tablet Take 100 mg by mouth daily.    . Tamsulosin HCl (FLOMAX) 0.4 MG CAPS Take 0.4 mg by mouth at bedtime.      No current facility-administered medications for this visit.      REVIEW OF SYSTEMS:   Constitutional: Denies fevers, chills or night sweats Eyes: Denies blurriness of vision Ears, nose, mouth, throat, and  face: Denies mucositis or sore throat Respiratory: Denies cough, dyspnea or wheezes Cardiovascular: Denies palpitation, chest discomfort or lower extremity swelling Gastrointestinal:  Denies nausea, heartburn or change in bowel habits Skin: Denies abnormal skin rashes Lymphatics: Denies new lymphadenopathy or easy bruising Neurological:Denies numbness, tingling or new weaknesses Behavioral/Psych: Mood is stable, no new changes  All other systems were reviewed with the patient and are negative.  PHYSICAL EXAMINATION: ECOG PERFORMANCE STATUS: 0 - Asymptomatic  Vitals:   02/23/17 1258  BP: 100/70  Pulse: 60  Resp: 18  Temp: 97.7 F (36.5 C)   Filed Weights   02/23/17 1258  Weight: 224 lb 14.4 oz (102 kg)    GENERAL:alert, no distress and comfortable.  He is morbidly obese SKIN: skin color, texture, turgor are normal, no rashes or significant lesions EYES: normal, Conjunctiva are pink and non-injected, sclera clear OROPHARYNX:no exudate, no erythema and lips, buccal mucosa, and tongue normal  NECK: supple, thyroid normal size, non-tender, without nodularity LYMPH:  no palpable lymphadenopathy in the cervical, axillary or inguinal LUNGS: clear to auscultation and percussion with normal breathing effort HEART: regular rate & rhythm and no murmurs and no lower extremity edema ABDOMEN:abdomen soft, non-tender and normal bowel sounds Musculoskeletal:no cyanosis of digits and no clubbing  NEURO: alert & oriented x 3 with fluent speech, no focal motor/sensory deficits  LABORATORY DATA:  I have reviewed the data as listed     Component Value Date/Time   NA 141 02/23/2017 1237   K 3.8 02/23/2017 1237   CL 106 04/23/2014 1020   CO2 27 02/23/2017 1237   GLUCOSE 113 02/23/2017 1237   BUN 18.3 02/23/2017 1237   CREATININE 1.2 02/23/2017 1237   CALCIUM 9.6 02/23/2017 1237   PROT 8.0 02/23/2017 1237   ALBUMIN 4.1 02/23/2017 1237   AST 24 02/23/2017 1237   ALT 15 02/23/2017 1237    ALKPHOS 105 02/23/2017 1237   BILITOT 0.98 02/23/2017 1237   GFRNONAA 84 (L) 08/02/2012 0655   GFRAA >90 08/02/2012 0655    No results found for: SPEP, UPEP  Lab Results  Component Value Date   WBC 4.5 02/23/2017   NEUTROABS 3.4 02/23/2017   HGB 11.4 (L) 02/23/2017   HCT 35.4 (L) 02/23/2017   MCV 89.9 02/23/2017   PLT 89 (L) 02/23/2017      Chemistry      Component Value Date/Time   NA 141 02/23/2017 1237   K 3.8 02/23/2017 1237   CL 106 04/23/2014 1020   CO2 27 02/23/2017 1237   BUN 18.3 02/23/2017 1237   CREATININE 1.2 02/23/2017 1237      Component Value Date/Time   CALCIUM 9.6 02/23/2017 1237   ALKPHOS 105 02/23/2017 1237   AST 24 02/23/2017 1237   ALT 15 02/23/2017 1237   BILITOT 0.98 02/23/2017 1237      ASSESSMENT & PLAN:  Thrombocytopenia (HCC) The most likely cause of his chronic thrombocytopenia is 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 Even though the CT imaging  report from September 2017 did not disclose this, I reviewed the imaging study with the patient extensively which clearly showed mild liver enlargement and splenomegaly. Rarely, autoimmune disorder that could cause pulmonary fibrosis can also cause mild thrombocytopenia. He is not symptomatic. We discussed importance of weight loss as a way to preserve his liver function.  In my experience, I do see improvement of thrombocytopenia for patients with weight loss alone. For now, there is no excessive risk of bleeding as long as platelet count is greater than 50,000. I will see him back again in 5 months with repeat blood work and examination I will call him with results of serum vitamin B12 and autoimmune screen  Obesity The patient is morbidly obese. He has fatty liver disease based on prior imaging. We discussed importance of weight loss and dietary modification   No orders of the defined types were placed in this encounter.   All questions were answered. The patient knows  to call the clinic with any problems, questions or concerns. No barriers to learning was detected.  I spent 10 minutes counseling the patient face to face. The total time spent in the appointment was 15 minutes and more than 50% was on counseling.     [name], MD 3/9/20184:10 PM

## 2017-02-24 LAB — VITAMIN B12: Vitamin B12: 531 pg/mL (ref 232–1245)

## 2017-02-24 LAB — HEPATITIS C ANTIBODY (REFLEX): HCV Ab: <0.1 s/co ratio (ref 0.0–0.9)

## 2017-02-27 ENCOUNTER — Telehealth: Payer: Self-pay

## 2017-02-27 DIAGNOSIS — R260 Ataxic gait: Secondary | ICD-10-CM | POA: Diagnosis not present

## 2017-02-27 LAB — ANTINUCLEAR ANTIBODIES, IFA: ANA Titer 1: NEGATIVE

## 2017-02-27 NOTE — Telephone Encounter (Signed)
-----   Message from [name], MD sent at 02/27/2017  1:47 PM EDT ----- Regarding: labs Pls call and him and let him know all other labs are OK ----- Message ----- From: Interface, Lab In Three Zero One Sent: 02/23/2017  12:52 PM To: [name], MD

## 2017-02-27 NOTE — Telephone Encounter (Signed)
Left below message. 

## 2017-03-01 DIAGNOSIS — R260 Ataxic gait: Secondary | ICD-10-CM | POA: Diagnosis not present

## 2017-03-06 DIAGNOSIS — R260 Ataxic gait: Secondary | ICD-10-CM | POA: Diagnosis not present

## 2017-03-08 DIAGNOSIS — R260 Ataxic gait: Secondary | ICD-10-CM | POA: Diagnosis not present

## 2017-03-12 ENCOUNTER — Other Ambulatory Visit: Payer: Self-pay

## 2017-03-12 DIAGNOSIS — R3915 Urgency of urination: Secondary | ICD-10-CM | POA: Diagnosis not present

## 2017-03-12 DIAGNOSIS — R3914 Feeling of incomplete bladder emptying: Secondary | ICD-10-CM | POA: Diagnosis not present

## 2017-03-12 DIAGNOSIS — N401 Enlarged prostate with lower urinary tract symptoms: Secondary | ICD-10-CM | POA: Diagnosis not present

## 2017-03-12 DIAGNOSIS — R350 Frequency of micturition: Secondary | ICD-10-CM | POA: Diagnosis not present

## 2017-03-12 MED ORDER — ROSUVASTATIN CALCIUM 40 MG PO TABS: 40.0000 mg | ORAL_TABLET | Freq: Every day | ORAL | 1 refills | Status: DC

## 2017-03-13 DIAGNOSIS — M109 Gout, unspecified: Secondary | ICD-10-CM | POA: Diagnosis not present

## 2017-03-13 DIAGNOSIS — M1990 Unspecified osteoarthritis, unspecified site: Secondary | ICD-10-CM | POA: Diagnosis not present

## 2017-03-13 DIAGNOSIS — M79671 Pain in right foot: Secondary | ICD-10-CM | POA: Diagnosis not present

## 2017-03-13 DIAGNOSIS — R21 Rash and other nonspecific skin eruption: Secondary | ICD-10-CM | POA: Diagnosis not present

## 2017-03-15 DIAGNOSIS — R260 Ataxic gait: Secondary | ICD-10-CM | POA: Diagnosis not present

## 2017-03-19 ENCOUNTER — Encounter (HOSPITAL_COMMUNITY): Payer: Self-pay

## 2017-03-19 ENCOUNTER — Emergency Department (HOSPITAL_COMMUNITY): Payer: Medicare Other

## 2017-03-19 ENCOUNTER — Emergency Department (HOSPITAL_COMMUNITY)
Admission: EM | Admit: 2017-03-19 | Discharge: 2017-03-19 | Disposition: A | Discharge status: Home / Self Care | Payer: Medicare Other | Attending: Emergency Medicine | Admitting: Emergency Medicine

## 2017-03-19 DIAGNOSIS — R600 Localized edema: Secondary | ICD-10-CM | POA: Diagnosis not present

## 2017-03-19 DIAGNOSIS — Z951 Presence of aortocoronary bypass graft: Secondary | ICD-10-CM | POA: Insufficient documentation

## 2017-03-19 DIAGNOSIS — I2510 Atherosclerotic heart disease of native coronary artery without angina pectoris: Secondary | ICD-10-CM | POA: Diagnosis not present

## 2017-03-19 DIAGNOSIS — Z79899 Other long term (current) drug therapy: Secondary | ICD-10-CM | POA: Diagnosis not present

## 2017-03-19 DIAGNOSIS — Z87891 Personal history of nicotine dependence: Secondary | ICD-10-CM | POA: Diagnosis not present

## 2017-03-19 DIAGNOSIS — R609 Edema, unspecified: Secondary | ICD-10-CM | POA: Diagnosis not present

## 2017-03-19 DIAGNOSIS — Z950 Presence of cardiac pacemaker: Secondary | ICD-10-CM | POA: Insufficient documentation

## 2017-03-19 DIAGNOSIS — Z452 Encounter for adjustment and management of vascular access device: Secondary | ICD-10-CM | POA: Diagnosis not present

## 2017-03-19 DIAGNOSIS — R2243 Localized swelling, mass and lump, lower limb, bilateral: Secondary | ICD-10-CM | POA: Insufficient documentation

## 2017-03-19 DIAGNOSIS — E119 Type 2 diabetes mellitus without complications: Secondary | ICD-10-CM | POA: Insufficient documentation

## 2017-03-19 DIAGNOSIS — I10 Essential (primary) hypertension: Secondary | ICD-10-CM | POA: Insufficient documentation

## 2017-03-19 DIAGNOSIS — R0602 Shortness of breath: Secondary | ICD-10-CM | POA: Diagnosis not present

## 2017-03-19 DIAGNOSIS — R1110 Vomiting, unspecified: Secondary | ICD-10-CM | POA: Diagnosis not present

## 2017-03-19 DIAGNOSIS — I517 Cardiomegaly: Secondary | ICD-10-CM | POA: Diagnosis not present

## 2017-03-19 DIAGNOSIS — T829XXA Unspecified complication of cardiac and vascular prosthetic device, implant and graft, initial encounter: Secondary | ICD-10-CM | POA: Diagnosis not present

## 2017-03-19 DIAGNOSIS — D61818 Other pancytopenia: Secondary | ICD-10-CM | POA: Diagnosis not present

## 2017-03-19 LAB — BASIC METABOLIC PANEL
Anion gap: 7 (ref 5–15)
BUN: 15 mg/dL (ref 6–20)
CO2: 28 mmol/L (ref 22–32)
Calcium: 9.2 mg/dL (ref 8.9–10.3)
Chloride: 109 mmol/L (ref 101–111)
Creatinine, Ser: 1.33 mg/dL — ABNORMAL HIGH (ref 0.61–1.24)
GFR calc Af Amer: 59 mL/min — ABNORMAL LOW (ref >60)
GFR calc non Af Amer: 51 mL/min — ABNORMAL LOW (ref >60)
Glucose, Bld: 105 mg/dL — ABNORMAL HIGH (ref 65–99)
Potassium: 4 mmol/L (ref 3.5–5.1)
Sodium: 144 mmol/L (ref 135–145)

## 2017-03-19 LAB — I-STAT TROPONIN, ED: Troponin i, poc: 0.01 ng/mL (ref 0.00–0.08)

## 2017-03-19 LAB — CBC
HCT: 35.2 % — ABNORMAL LOW (ref 39.0–52.0)
Hemoglobin: 11.1 g/dL — ABNORMAL LOW (ref 13.0–17.0)
MCH: 28.8 pg (ref 26.0–34.0)
MCHC: 31.5 g/dL (ref 30.0–36.0)
MCV: 91.2 fL (ref 78.0–100.0)
Platelets: 89 10*3/uL — ABNORMAL LOW (ref 150–400)
RBC: 3.86 MIL/uL — ABNORMAL LOW (ref 4.22–5.81)
RDW: 17.2 % — ABNORMAL HIGH (ref 11.5–15.5)
WBC: 3.5 10*3/uL — ABNORMAL LOW (ref 4.0–10.5)

## 2017-03-19 LAB — BRAIN NATRIURETIC PEPTIDE: B Natriuretic Peptide: 224.5 pg/mL — ABNORMAL HIGH (ref 0.0–100.0)

## 2017-03-19 NOTE — ED Provider Notes (Signed)
[name]   CSN: [id number] Arrival date & time: 03/19/17  1745     History   Chief Complaint Chief Complaint  Patient presents with  . Pacemaker Problem    HPI Jamie G Davarion Cuffee. is a 76 y.o. male.  The history is provided by the patient and the spouse.  Illness  This is a new (leg swelling) problem. The current episode started more than 1 week ago. The problem has been gradually worsening. Associated symptoms include shortness of breath (on exertion only). Pertinent negatives include no chest pain. Relieved by: elevating legs. Treatments tried: lasix.     Presented to ED from PCP office for possible pacemaker problem. Pt presented to PCP for 2-3 weeks b/l leg swelling. Pt takes Furosemide 40 mg three times per week, reports compliance with medication. Denies light-headedness, syncope or chest pain. Reports DOE that has been on going for months.   EKG at PCP office read as ineffective pacemaker, however consistent pacer spikes noted, HR 60.   Past Medical History:  Diagnosis Date  . Atrial fibrillation [facility])    s/p Cox Maze 1/09;  Multaq Rx d/c'd in 2014 due to pulmo fibrosis;  coumadin d/c'd in 2014 due to spontaneous subdural hematoma  . BPH (benign prostatic hyperplasia)   . CAD (coronary artery disease), native coronary artery    s/p CABG 12/2007;  Myoview 12/2011: EF 66%, no scar or ischemia; normal.  . DM (diabetes mellitus) ([place])   . Hyperlipidemia type II   . Hypertension   . OSA (obstructive sleep apnea)   . Pacemaker    PPM - St. Jude  . Pulmonary fibrosis ([place])    Multaq d/c'd 7/14  . Subdural hematoma ([place]) 07/2012   spontaneous;  coumadin d/c'd => no longer a candidate for anticoagulation    Patient Active Problem List   Diagnosis Date Noted  . Fatty liver disease, nonalcoholic 19/37/9024  . Thrombocytopenia ([place]) 11/27/2016  . Lung nodule 04/23/2014  . 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 Long term (current) use  of anticoagulants 11/22/2011  . Abdominal bruit 03/07/2011  . Pacemaker-St.Jude   . CAD (coronary artery disease), native coronary artery   . Hyperlipidemia type II   . OSA (obstructive sleep apnea)   . PACEMAKER, PERMANENT 08/17/2010  . DIABETES MELLITUS 08/16/2010  . HYPERLIPIDEMIA 08/16/2010  . Obesity 08/16/2010  . Essential hypertension 08/16/2010  . Atrial fibrillation ([place]) 08/16/2010  . IRREGULAR HEART RATE 08/16/2010  . SLEEP APNEA 08/16/2010  . BENIGN PROSTATIC HYPERTROPHY, HX OF 08/16/2010    Past Surgical History:  Procedure Laterality Date  . APPENDECTOMY    . CHOLECYSTECTOMY    . CORONARY ARTERY BYPASS GRAFT     x3 (left internal mammary artery to distal left anterior descending coronary artery, saphenous vain graft to second circumflex marginal branch, saphenous vain graft to posterior descending coronary artery, endoscopic saphenous vain harvest from right thigh) and modified Cox - Maze IV procedure.  [name],MD. Electronically signed CHO/MEDQ D: 01/09/2008/ JOB: [id number] cc:  [name]  . [name]  07/30/2012   Procedure: CRANIOTOMY HEMATOMA EVACUATION SUBDURAL;  Surgeon: [name], MD;  Location: [place] [facility];  Service: Neurosurgery;  Laterality: Right;  Right craniotomy for evacuation of subdural hematoma  . FOOT SURGERY    . HERNIA REPAIR    . ORCHIECTOMY     Left  /  testicular cancer  . PACEMAKER PLACEMENT     PPM - St. Jude  Home Medications    Prior to Admission medications   Medication Sig Start Date End Date Taking? Authorizing Provider  colchicine 0.6 MG tablet Take 1.2 mg by mouth then 0.6 mg one hour later as needed for gout flares/as directed 05/13/15  Yes Historical Provider, MD  furosemide (LASIX) 40 MG tablet Take 40 mg by mouth daily.    Yes Historical Provider, MD  lisinopril (PRINIVIL,ZESTRIL) 2.5 MG tablet Take 1 tablet by mouth daily. 05/03/14  Yes Historical Provider, MD  metoprolol (LOPRESSOR) 50 MG tablet TAKE 1  TABLET TWICE DAILY 02/21/17  Yes [name], MD  omega-3 acid ethyl esters (LOVAZA) 1 G capsule Take 2 g by mouth 2 (two) times daily.    Yes Historical Provider, MD  oxybutynin (DITROPAN-XL) 10 MG 24 hr tablet Take 10 mg by mouth daily.  04/07/13  Yes Historical Provider, MD  pantoprazole (PROTONIX) 40 MG tablet Take 40 mg by mouth daily. 02/22/17  Yes Historical Provider, MD  potassium chloride SA (K-DUR,KLOR-CON) 20 MEQ tablet Take 20 mEq by mouth daily.     Yes Historical Provider, MD  rosuvastatin (CRESTOR) 40 MG tablet Take 1 tablet (40 mg total) by mouth daily. 03/12/17  Yes [name], MD  sertraline (ZOLOFT) 100 MG tablet Take 100 mg by mouth daily.   Yes Historical Provider, MD  Tamsulosin HCl (FLOMAX) 0.4 MG CAPS Take 0.4 mg by mouth at bedtime.    Yes Historical Provider, MD  allopurinol (ZYLOPRIM) 300 MG tablet Take 1 tablet by mouth daily. 06/21/16   Historical Provider, MD  etanercept (ENBREL SURECLICK) 50 MG/ML injection Inject 50 mg into the skin once a week.    Historical Provider, MD    Family History Family History  Problem Relation Age of Onset  . Heart disease Father   . Heart attack Father   . Heart failure Father   . Heart disease Mother   . Alzheimer's disease Mother     Social History Social History  Substance Use Topics  . Smoking status: Former Smoker    Quit date: 02/21/1991  . Smokeless tobacco: Never Used  . Alcohol use No     Allergies   Allopurinol and Enbrel [etanercept]   Review of Systems Review of Systems  Constitutional: Negative for diaphoresis and fever.  Respiratory: Positive for shortness of breath (on exertion only). Negative for cough and chest tightness.   Cardiovascular: Positive for leg swelling. Negative for chest pain and palpitations.  Gastrointestinal: Positive for abdominal distention. Negative for diarrhea, nausea and vomiting.  Genitourinary: Negative for flank pain.  Neurological: Negative for light-headedness.  All  other systems reviewed and are negative.    Physical Exam Updated Vital Signs BP 119/80   Pulse 60   Temp 97.9 F (36.6 C) (Oral)   Resp 18   SpO2 94%   Physical Exam  Constitutional: He is oriented to person, place, and time. He appears well-developed and well-nourished.  HENT:  Head: Normocephalic and atraumatic.  Mouth/Throat: Oropharynx is clear and moist.  Eyes: Conjunctivae and EOM are normal.  Neck: Normal range of motion. Neck supple.  Cardiovascular: Normal rate and regular rhythm.   No murmur heard. Pulmonary/Chest: Effort normal and breath sounds normal. No respiratory distress. He has no rales.  Able to speak in full sentences  Abdominal: Soft. He exhibits distension. There is no tenderness. There is no guarding.  Musculoskeletal: Normal range of motion. He exhibits edema (trace pitting edema b/l LE, symmetric).  Neurological: He is alert  and oriented to person, place, and time.  Skin: Skin is warm and dry. Capillary refill takes less than 2 seconds.  Psychiatric: He has a normal mood and affect.  Nursing [name] and vitals reviewed.    ED Treatments / Results  Labs (all labs ordered are listed, but only abnormal results are displayed) Labs Reviewed  BASIC METABOLIC PANEL - Abnormal; Notable for the following:       Result Value   Glucose, Bld 105 (*)    Creatinine, Ser 1.33 (*)    GFR calc non Af Amer 51 (*)    GFR calc Af Amer 59 (*)    All other components within normal limits  CBC - Abnormal; Notable for the following:    WBC 3.5 (*)    RBC 3.86 (*)    Hemoglobin 11.1 (*)    HCT 35.2 (*)    RDW 17.2 (*)    Platelets 89 (*)    All other components within normal limits  BRAIN NATRIURETIC PEPTIDE - Abnormal; Notable for the following:    B Natriuretic Peptide 224.5 (*)    All other components within normal limits  I-STAT TROPOININ, ED    EKG  EKG Interpretation None       Radiology Dg Chest 2 View  Result Date: 03/19/2017 CLINICAL DATA:   Dysfunction of pacemaker apparatus. EXAM: CHEST  2 VIEW COMPARISON:  04/30/2014 CT chest, CXR 05/20/2013. Report from 03/22/2015 CXR FINDINGS: Stable cardiomegaly with aortic arch atherosclerosis. The patient is status post CABG with median sternotomy sutures in place. Left-sided pacemaker apparatus with right atrial and right ventricular leads are again identified. No disruption of the pacemaking leads. Chronic interstitial change and subpleural areas of fibrosis are noted bilaterally. No effusion or pneumothorax. No acute nor suspicious osseous abnormality. IMPRESSION: 1. Stable cardiomegaly with aortic atherosclerosis. 2. Left-sided pacemaker apparatus with right atrial and right ventricular leads are again seen without disruption of the leads identified. 3. Chronic interstitial prominence bilaterally with minimal areas of subpleural fibrosis. Electronically Signed   By: Ashley Royalty M.D.   On: 03/19/2017 18:50    Procedures Procedures (including critical care time)  Medications Ordered in ED Medications - No data to display   Initial Impression / Assessment and Plan / ED Course  I have reviewed the triage vital signs and the nursing notes.  Pertinent labs & imaging results that were available during my care of the patient were reviewed by me and considered in my medical decision making (see chart for details).    76 y.o. male presents from PCP for concerns for pacemaker function. EKG reviewed, pacer spikes noted with associated QRS complexes. Repeat EKG in ED c/w prior. VSS, NAD, sating well on RA. Lungs CTAB with mild peripheral edema. No conversational dyspnea. CXR w/o pulmonary edema, shows pacer leads intact. Mildly elevated Cr, 1.33 from 1.2, BUN normal. CBC with baseline pancytopenia. BNP mildly elevated 224. Pacemaker interrogated in ED, normal function. Patient has follow-up with pacemaker physician in two days, advised to keep appointment. Advised to make appointment with Cardiologist for  management of diuretics and out-patient echocardiogram. Return precautions given. Pt voiced understanding and agreement with plan.    Final Clinical Impressions(s) / ED Diagnoses   Final diagnoses:  Bilateral lower extremity edema    New Prescriptions Discharge Medication List as of 03/19/2017 10:12 PM       Monico Blitz, MD 03/20/17 6283    Orlie Dakin, MD 03/20/17 1401

## 2017-03-19 NOTE — ED Notes (Signed)
Patient transported to X-ray 

## 2017-03-19 NOTE — ED Provider Notes (Signed)
Patient went to his primary care physician Dr.Pharr earlier today for scheduled appointment due to bilateral leg edema which she's had for the past several weeks. He denies any change in his breathing. Dr. Victoriano Lain sent patient here to have his pacemaker checked. Patient appears in no distress. Lungs clear auscultation heart regular rate and rhythm abdomen obese, nontender. Bilateral lower extremities with trace pretibial pitting edema. Skin warm dry. Patient reports that his Lasix doses recently been increased by his urologist. I suggest that he make point with his cardiologist to adjust Lasix dose if needed. I don't suggest any medication changes presently. Chest x-ray viewed by me   Orlie Dakin, MD 03/19/17 2021

## 2017-03-19 NOTE — Discharge Instructions (Signed)
Follow-up with pacemaker doctor as scheduled. Make an appointment to follow-up with Cardiologist as soon as possible.

## 2017-03-19 NOTE — ED Triage Notes (Signed)
Pt was sent here by his primary care doctor for pacemaker malfunction and possible dehydration. Pt initially went to the doctor for leg swelling.

## 2017-03-21 ENCOUNTER — Ambulatory Visit (INDEPENDENT_AMBULATORY_CARE_PROVIDER_SITE_OTHER): Payer: Medicare Other | Admitting: *Deleted

## 2017-03-21 ENCOUNTER — Other Ambulatory Visit: Payer: Self-pay

## 2017-03-21 DIAGNOSIS — I495 Sick sinus syndrome: Secondary | ICD-10-CM | POA: Diagnosis not present

## 2017-03-21 MED ORDER — ROSUVASTATIN CALCIUM 40 MG PO TABS: 40.0000 mg | ORAL_TABLET | Freq: Every day | ORAL | 1 refills | Status: DC

## 2017-03-21 NOTE — Progress Notes (Signed)
Pacemaker check in clinic. Normal device function. Thresholds, sensing, impedances consistent with previous measurements. Device programmed to maximize longevity. 86 AMS~ peak A 384bpm~14sec no EGMs available, previously documented.  No high ventricular rates noted. Device programmed at appropriate safety margins. Histogram distribution appropriate for patient activity level. Device programmed to optimize intrinsic conduction. Estimated longevity 2.75 years. ROV w/ GT 6 months.

## 2017-03-21 NOTE — Telephone Encounter (Signed)
Rx(s) sent to pharmacy electronically.  

## 2017-03-28 DIAGNOSIS — Z79899 Other long term (current) drug therapy: Secondary | ICD-10-CM | POA: Diagnosis not present

## 2017-03-28 DIAGNOSIS — I10 Essential (primary) hypertension: Secondary | ICD-10-CM | POA: Diagnosis not present

## 2017-03-29 ENCOUNTER — Ambulatory Visit (INDEPENDENT_AMBULATORY_CARE_PROVIDER_SITE_OTHER): Payer: Medicare Other | Admitting: Nurse Practitioner

## 2017-03-29 ENCOUNTER — Encounter: Payer: Self-pay | Admitting: Nurse Practitioner

## 2017-03-29 VITALS — BP 116/72 | HR 60 | Ht 68.0 in | Wt 225.8 lb

## 2017-03-29 DIAGNOSIS — I480 Paroxysmal atrial fibrillation: Secondary | ICD-10-CM | POA: Diagnosis not present

## 2017-03-29 DIAGNOSIS — I2510 Atherosclerotic heart disease of native coronary artery without angina pectoris: Secondary | ICD-10-CM | POA: Diagnosis not present

## 2017-03-29 DIAGNOSIS — I10 Essential (primary) hypertension: Secondary | ICD-10-CM

## 2017-03-29 DIAGNOSIS — I5033 Acute on chronic diastolic (congestive) heart failure: Secondary | ICD-10-CM

## 2017-03-29 NOTE — Patient Instructions (Addendum)
Medication Instructions:  INCREASE Lasix to 40mg  (1 tablet) two times daily for 5 DAYS INCREASE potassium to 20 meq (1 tablet) two times daily for 5 DAYS  RETURN to normal dose Tuesday 4/17  Labwork: Please have labwork completed when you have your Echo (BMET).  [facility] is located at [place]  (same building as [employer] [facility] at [employer])  Testing/Procedures: Your physician has requested that you have an echocardiogram. Echocardiography is a painless test that uses sound waves to create images of your heart. It provides your doctor with information about the size and shape of your heart and how well your heart's chambers and valves are working. This procedure takes approximately one hour. There are no restrictions for this procedure.  This will be completed at our [place]: Your physician recommends that you schedule a follow-up appointment in: 2 WEEKS with Ignacia Bayley NP or Dr. [name].   Any Other Special Instructions Will Be Listed Below (If Applicable).     If you need a refill on your cardiac medications before your next appointment, please call your pharmacy.

## 2017-03-29 NOTE — Progress Notes (Signed)
Office Visit    Patient Name: [name]. Date of Encounter: 03/29/2017  Primary Care Provider:  Horatio Pel, MD Primary Cardiologist:  B. [name], MD   Chief Complaint    76 year old male with a prior history of CAD status post CABG, paroxysmal atrial fibrillation, diastolic dysfunction, diabetes, hypertension, hyperlipidemia, pulmonary fibrosis, and spontaneous subdural hematoma, who presents for follow-up related to dyspnea and lower extremity edema.  Past Medical History    Past Medical History:  Diagnosis Date  . Atrial fibrillation [facility])    s/p Cox Maze 1/09;  Multaq Rx d/c'd in 2014 due to pulmo fibrosis;  coumadin d/c'd in 2014 due to spontaneous subdural hematoma  . BPH (benign prostatic hyperplasia)   . CAD (coronary artery disease), native coronary artery    a. s/p CABG 12/2007;  b. Myoview 12/2011: EF 66%, no scar or ischemia; normal.  . Diastolic dysfunction    a. 05/2013 Echo: EF 55%, mild LVH, diast dysfxn, Ao sclerosis, mildly dil LA, RV dysfxn (poorly visualized), PASP 59mHg.  . DM (diabetes mellitus) ([place]   . Hyperlipidemia type II   . Hypertension   . OSA (obstructive sleep apnea)   . Pacemaker    PPM - St. Jude  . Pulmonary fibrosis ([place]    Multaq d/c'd 7/14  . [place] 07[place]   spontaneous;  coumadin d/c'd => no longer a candidate for anticoagulation   Past Surgical History:  Procedure Laterality Date  . APPENDECTOMY    . CHOLECYSTECTOMY    . CORONARY ARTERY BYPASS GRAFT     x3 (left internal mammary artery to distal left anterior descending coronary artery, saphenous vain graft to second circumflex marginal branch, saphenous vain graft to posterior descending coronary artery, endoscopic saphenous vain harvest from right thigh) and modified Cox - Maze IV procedure.  CValentina Gu [name],MD. Electronically signed CHO/MEDQ D: 01/09/2008/ JOB: [id number]:  JIran SizerMD  . CKyla Balzarine 07/30/2012   Procedure: CRANIOTOMY  HEMATOMA EVACUATION SUBDURAL;  Surgeon: GElaina Hoops MD;  [place];  Service: Neurosurgery;  Laterality: Right;  Right craniotomy for evacuation of subdural hematoma  . FOOT SURGERY    . HERNIA REPAIR    . ORCHIECTOMY     Left  /  testicular cancer  . PACEMAKER PLACEMENT     PPM - St. Jude    Allergies  Allergies  Allergen Reactions  . Allopurinol Itching    May or may not be allergic to this (If the patient should find out he is NOT allergic, please omit this entry.)  . Enbrel [Etanercept] Itching    May or may not be allergic to this (If the patient should find out he is NOT allergic, please omit this entry.)    History of Present Illness    76year old male with the above complex past medical history. He has a history of CAD and is status post CABG in January 2009 with negative Myoview in January 2013. He also has a history of atrial fibrillation and underwent Cox maze procedure in January 2009. He was previous treated with Multaq therapy but this was discontinued secondary to development of pulmonary fibrosis. He is not on oral anticoagulation for atrial fibrillation secondary to a history of spontaneous subdural hematoma in August 2013 requiring craniotomy and evacuation. Other history includes hypertension, hyperlipidemia, diabetes, and diastolic dysfunction. Over the past month, he has been experiencing dyspnea on exertion and also lower extremity swelling. His weight is up  about 7 pounds on his home scale. He has not had any chest pain and denies PND, orthopnea, dizziness, syncope, or early satiety. He is on Lasix 40 mg daily and has been compliant with his medications. He does report adding salt to food frequently and also he and his wife eat out about 3-4 times per week. He doesn't think that his salt intake has changed any. Because of dyspnea and lower extremity swelling, he was recently seen in the emergency room on April 2. Chest x-ray did not show any significant  edema. His BNP was mildly elevated at 224. Creatinine was 1.33. No changes were made to his medications and he was advised to follow-up with cardiology.  Home Medications    Prior to Admission medications   Medication Sig Start Date End Date Taking? Authorizing Provider  allopurinol (ZYLOPRIM) 300 MG tablet Take 1 tablet by mouth daily. 06/21/16  Yes Historical Provider, MD  colchicine 0.6 MG tablet Take 1.2 mg by mouth then 0.6 mg one hour later as needed for gout flares/as directed 05/13/15  Yes Historical Provider, MD  etanercept (ENBREL SURECLICK) 50 MG/ML injection Inject 50 mg into the skin once a week.   Yes Historical Provider, MD  furosemide (LASIX) 40 MG tablet Take 40 mg by mouth daily.    Yes Historical Provider, MD  lisinopril (PRINIVIL,ZESTRIL) 2.5 MG tablet Take 1 tablet by mouth daily. 05/03/14  Yes Historical Provider, MD  metoprolol (LOPRESSOR) 50 MG tablet TAKE 1 TABLET TWICE DAILY 02/21/17  Yes [name], MD  omega-3 acid ethyl esters (LOVAZA) 1 G capsule Take 2 g by mouth 2 (two) times daily.    Yes Historical Provider, MD  oxybutynin (DITROPAN-XL) 10 MG 24 hr tablet Take 10 mg by mouth daily.  04/07/13  Yes Historical Provider, MD  pantoprazole (PROTONIX) 40 MG tablet Take 40 mg by mouth daily. 02/22/17  Yes Historical Provider, MD  potassium chloride SA (K-DUR,KLOR-CON) 20 MEQ tablet Take 20 mEq by mouth daily.     Yes Historical Provider, MD  rosuvastatin (CRESTOR) 40 MG tablet Take 1 tablet (40 mg total) by mouth daily. MUST KEEP APPOINTMENT 03/29/17 WITH [name],NP FOR FUTURE REFILLS 03/21/17  Yes [name], MD  sertraline (ZOLOFT) 100 MG tablet Take 100 mg by mouth daily.   Yes Historical Provider, MD  Tamsulosin HCl (FLOMAX) 0.4 MG CAPS Take 0.4 mg by mouth at bedtime.    Yes Historical Provider, MD    Review of Systems    Lower extremity swelling and dyspnea on exertion for the past month with 7 pound weight gain. He denies chest pain, palpitations, PND,  orthopnea, dizziness, syncope, or early satiety.  All other systems reviewed and are otherwise negative except as noted above.  Physical Exam    VS:  BP 116/72   Pulse 60   Ht '5\' 8"'  (1.727 m)   Wt 225 lb 12.8 oz (102.4 kg)   SpO2 97%   BMI 34.33 kg/m  , BMI Body mass index is 34.33 kg/m. GEN: Well nourished, well developed, in no acute distress.  HEENT: normal.  Neck: Supple, JVD to jaw, no carotid bruits, or masses. Cardiac: RRR, no murmurs, rubs, or gallops. No clubbing, cyanosis, 2+ bilateral right greater than left lower extremity edema.  Radials/DP/PT 2+ and equal bilaterally.  Respiratory:  Respirations regular and unlabored, clear to auscultation bilaterally. GI: Soft, nontender, nondistended, BS + x 4. MS: no deformity or atrophy. Skin: warm and dry, no rash. Neuro:  Strength and sensation are intact. Psych: Normal affect.  Accessory Clinical Findings    ECG - EKG from ER visit on April 2 shows atrial paced ventricular sensed rhythm with delayed AV conduction, rate of 60, right bundle branch block. No acute changes.  Assessment & Plan    1.  Acute on chronic diastolic congestive heart failure: Patient reports a 7 pound weight gain over the past month with dyspnea on exertion and increasing lower extremity swelling. He has evidence of volume overload on exam with JVD, some increased abdominal girth, and edema. Recent ER evaluation showed mild elevation of BNP at 224. Chest x-ray did not show any significant heart failure. Creatinine was mildly elevated at 1.33. I have asked him to increase his Lasix to 40 mg twice a day for the next 5 days. He will increase potassium to 20 mEq twice a day over the same period of time. I will arrange for a follow-up 2-D echocardiogram and also basic metabolic panel next week. I will plan to see him back in clinic in 2 weeks to readdress. He admits to over salting his food and he is wife eat out frequently.  We discussed the importance of daily  weights, sodium restriction, medication compliance, and symptom reporting and he verbalizes understanding. If he is noted to have new LV dysfunction, he will require ischemic evaluation. Heart rate and blood pressure are stable today.  2. Coronary artery disease: No recent chest pain. Obtaining echocardiogram in the setting of heart failure symptoms as outlined above. If EF is down, he will require repeat ischemic evaluation. He remains on beta blocker, ACE inhibitor, and statin therapy. He is not on aspirin in the setting of prior subdural hematoma.  3. Paroxysmal atrial fibrillation: Status post Cox maze procedure in 2009 at the time of his bypass surgery. ECG on April 2 showed atrial paced and ventricular sensed rhythm. He has not had any tachycardia or palpitations to suggest recurrence of atrial fibrillation. He is not on oral anticoagulation in the setting of prior history of subdural hematoma.  4. Essential hypertension: Blood pressure is stable at 116/72 today. He remains on beta blocker and ACE inhibitor therapy.  5. Hyperlipidemia: LDL was 84 and 2014. This is followed by his primary care provider. He remains on Crestor therapy.  6. Disposition: Follow-up be met an echocardiogram within the next week. Plan to see him back in clinic within 2 weeks.   Murray Hodgkins, NP 03/29/2017, 4:55 PM

## 2017-04-06 ENCOUNTER — Other Ambulatory Visit: Payer: Self-pay | Admitting: Internal Medicine

## 2017-04-12 ENCOUNTER — Other Ambulatory Visit: Payer: Self-pay

## 2017-04-12 ENCOUNTER — Other Ambulatory Visit: Payer: Medicare Other | Admitting: *Deleted

## 2017-04-12 ENCOUNTER — Ambulatory Visit (HOSPITAL_COMMUNITY): Payer: Medicare Other | Attending: Cardiovascular Disease

## 2017-04-12 DIAGNOSIS — I10 Essential (primary) hypertension: Secondary | ICD-10-CM | POA: Diagnosis not present

## 2017-04-12 DIAGNOSIS — I361 Nonrheumatic tricuspid (valve) insufficiency: Secondary | ICD-10-CM | POA: Insufficient documentation

## 2017-04-12 DIAGNOSIS — I2510 Atherosclerotic heart disease of native coronary artery without angina pectoris: Secondary | ICD-10-CM

## 2017-04-12 DIAGNOSIS — E7801 Familial hypercholesterolemia: Secondary | ICD-10-CM | POA: Diagnosis not present

## 2017-04-12 DIAGNOSIS — I4891 Unspecified atrial fibrillation: Secondary | ICD-10-CM | POA: Diagnosis not present

## 2017-04-12 DIAGNOSIS — I340 Nonrheumatic mitral (valve) insufficiency: Secondary | ICD-10-CM | POA: Diagnosis not present

## 2017-04-12 DIAGNOSIS — I420 Dilated cardiomyopathy: Secondary | ICD-10-CM | POA: Insufficient documentation

## 2017-04-12 DIAGNOSIS — I517 Cardiomegaly: Secondary | ICD-10-CM | POA: Insufficient documentation

## 2017-04-12 DIAGNOSIS — I5033 Acute on chronic diastolic (congestive) heart failure: Secondary | ICD-10-CM

## 2017-04-12 LAB — BASIC METABOLIC PANEL
BUN/Creatinine Ratio: 16 (ref 10–24)
BUN: 19 mg/dL (ref 8–27)
CO2: 24 mmol/L (ref 18–29)
Calcium: 9.3 mg/dL (ref 8.6–10.2)
Chloride: 100 mmol/L (ref 96–106)
Creatinine, Ser: 1.18 mg/dL (ref 0.76–1.27)
GFR calc Af Amer: 70 mL/min/{1.73_m2} (ref >59)
GFR calc non Af Amer: 60 mL/min/{1.73_m2} (ref >59)
Glucose: 99 mg/dL (ref 65–99)
Potassium: 4.3 mmol/L (ref 3.5–5.2)
Sodium: 140 mmol/L (ref 134–144)

## 2017-04-12 MED ORDER — PERFLUTREN LIPID MICROSPHERE
1.0000 mL | INTRAVENOUS | Status: AC | PRN
  Administered 2017-04-12: 2 mL via INTRAVENOUS

## 2017-04-12 NOTE — Progress Notes (Signed)
[id number] 

## 2017-04-12 NOTE — Addendum Note (Signed)
Addended by: Eulis Foster on: 04/12/2017 11:38 AM   Modules accepted: Orders

## 2017-04-12 NOTE — Addendum Note (Signed)
Addended by: Eulis Foster on: 04/12/2017 10:49 AM   Modules accepted: Orders

## 2017-04-17 ENCOUNTER — Encounter: Payer: Self-pay | Admitting: Nurse Practitioner

## 2017-04-17 ENCOUNTER — Ambulatory Visit (INDEPENDENT_AMBULATORY_CARE_PROVIDER_SITE_OTHER): Payer: Medicare Other | Admitting: Nurse Practitioner

## 2017-04-17 VITALS — BP 110/68 | HR 62 | Ht 68.0 in | Wt 212.4 lb

## 2017-04-17 DIAGNOSIS — I2510 Atherosclerotic heart disease of native coronary artery without angina pectoris: Secondary | ICD-10-CM

## 2017-04-17 DIAGNOSIS — I5030 Unspecified diastolic (congestive) heart failure: Secondary | ICD-10-CM | POA: Diagnosis not present

## 2017-04-17 DIAGNOSIS — E785 Hyperlipidemia, unspecified: Secondary | ICD-10-CM | POA: Diagnosis not present

## 2017-04-17 DIAGNOSIS — I480 Paroxysmal atrial fibrillation: Secondary | ICD-10-CM | POA: Diagnosis not present

## 2017-04-17 DIAGNOSIS — I10 Essential (primary) hypertension: Secondary | ICD-10-CM

## 2017-04-17 NOTE — Progress Notes (Signed)
Office Visit    Patient Name: [name]. Date of Encounter: 04/17/2017  Primary Care Provider:  Horatio Pel, MD Primary Cardiologist:  B. [name], MD   Chief Complaint    76 year old male with a prior history of CAD status post CABG, paroxysmal atrial fibrillation, diastolic dysfunction, diabetes, hypertension, hyperlipidemia, pulmonary fibrosis, and spontaneous subdural hematoma, who presents for follow-up After recent evaluation for worsening dyspnea and edema.  Past Medical History    Past Medical History:  Diagnosis Date  . (HFpEF) heart failure with preserved ejection fraction ([place])    a. 05/2013 Echo: EF 55%, mild LVH, diast dysfxn, Ao sclerosis, mildly dil LA, RV dysfxn (poorly visualized), PASP 26mmHg;  b. 03/2017 Echo: EF 55-60%, no rwma, triv MR, mildly dil RV, mod TR, PASP 38mmHg.  . Atrial fibrillation [facility])    s/p Cox Maze 1/09;  Multaq Rx d/c'd in 2014 due to pulmo fibrosis;  coumadin d/c'd in 2014 due to spontaneous subdural hematoma  . BPH (benign prostatic hyperplasia)   . CAD (coronary artery disease), native coronary artery    a. s/p CABG 12/2007;  b. Myoview 12/2011: EF 66%, no scar or ischemia; normal.  . DM (diabetes mellitus) ([place])   . Hyperlipidemia type II   . Hypertension   . OSA (obstructive sleep apnea)   . Pacemaker    PPM - St. Jude  . Pulmonary fibrosis ([place])    Multaq d/c'd 7/14  . Subdural hematoma ([place]) 07/2012   spontaneous;  coumadin d/c'd => no longer a candidate for anticoagulation   Past Surgical History:  Procedure Laterality Date  . APPENDECTOMY    . CHOLECYSTECTOMY    . CORONARY ARTERY BYPASS GRAFT     x3 (left internal mammary artery to distal left anterior descending coronary artery, saphenous vain graft to second circumflex marginal branch, saphenous vain graft to posterior descending coronary artery, endoscopic saphenous vain harvest from right thigh) and modified Cox - Maze IV procedure.  [name],MD.  Electronically signed CHO/MEDQ D: 01/09/2008/ JOB: [id number] cc:  [name]  . [name]  07/30/2012   Procedure: CRANIOTOMY HEMATOMA EVACUATION SUBDURAL;  Surgeon: [name], MD;  Location: [place] [facility] OR[facility];  Service: Neurosurgery;  Laterality: Right;  Right craniotomy for evacuation of subdural hematoma  . FOOT SURGERY    . HERNIA REPAIR    . ORCHIECTOMY     Left  /  testicular cancer  . PACEMAKER PLACEMENT     PPM - St. Jude    Allergies  Allergies  Allergen Reactions  . Allopurinol Itching    May or may not be allergic to this (If the patient should find out he is NOT allergic, please omit this entry.)  . Enbrel [Etanercept] Itching    May or may not be allergic to this (If the patient should find out he is NOT allergic, please omit this entry.)    History of Present Illness    76 year old male with the above complex past medical history. He has a history of CAD and is status post CABG in January 2009 with negative Myoview in January 2013. He also has a history of atrial fibrillation and underwent Cox maze procedure in January 2009. He was previous treated with Multaq therapy but this was discontinued secondary to development of pulmonary fibrosis. He is not on oral anticoagulation for atrial fibrillation secondary to a history of spontaneous subdural hematoma in August 2013 requiring craniotomy and evacuation. Other history includes hypertension, hyperlipidemia,  diabetes, and diastolic dysfunction.  I recently saw him in clinic on April 12 secondary to worsening dyspnea, lower extremity swelling, and 7 pound weight gain. He was volume overloaded and increase his Lasix to 40 mg twice a day 5 days. I also obtained an echocardiogram that showed normal LV function without significant valvular pathology. Renal function which lites were stable on April 26. Since his last visit, he has lost 14 pounds. He's been back on Lasix 40 mg daily since April 18. He and his wife been much more  careful with salt at home. Despite improved edema and drop in weight, he continues to have dyspnea on exertion particularly with inclines. He is less effective on flat surfaces. He notes that his prior anginal equivalent was dyspnea with inclines. He has not had any chest pain and denies PND, orthopnea, dizziness, syncope, edema, or early satiety.  Home Medications    Prior to Admission medications   Medication Sig Start Date End Date Taking? Authorizing Provider  allopurinol (ZYLOPRIM) 300 MG tablet Take 1 tablet by mouth daily. 06/21/16  Yes Historical Provider, MD  colchicine 0.6 MG tablet Take 1.2 mg by mouth then 0.6 mg one hour later as needed for gout flares/as directed 05/13/15  Yes Historical Provider, MD  furosemide (LASIX) 40 MG tablet Take 40 mg by mouth daily.    Yes Historical Provider, MD  lisinopril (PRINIVIL,ZESTRIL) 2.5 MG tablet Take 1 tablet by mouth daily. 05/03/14  Yes Historical Provider, MD  metoprolol (LOPRESSOR) 50 MG tablet TAKE 1 TABLET TWICE DAILY 02/21/17  Yes [name], MD  omega-3 acid ethyl esters (LOVAZA) 1 G capsule Take 2 g by mouth 2 (two) times daily.    Yes Historical Provider, MD  oxybutynin (DITROPAN-XL) 10 MG 24 hr tablet Take 10 mg by mouth daily.  04/07/13  Yes Historical Provider, MD  pantoprazole (PROTONIX) 40 MG tablet Take 40 mg by mouth daily. 02/22/17  Yes Historical Provider, MD  potassium chloride SA (K-DUR,KLOR-CON) 20 MEQ tablet Take 20 mEq by mouth daily.     Yes Historical Provider, MD  rosuvastatin (CRESTOR) 40 MG tablet Take 1 tablet (40 mg total) by mouth daily. MUST KEEP APPOINTMENT 03/29/17 WITH CHRIS BERGE,NP FOR FUTURE REFILLS 03/21/17  Yes [name], MD  sertraline (ZOLOFT) 100 MG tablet Take 100 mg by mouth daily.   Yes Historical Provider, MD  Tamsulosin HCl (FLOMAX) 0.4 MG CAPS Take 0.4 mg by mouth at bedtime.    Yes Historical Provider, MD  etanercept (ENBREL SURECLICK) 50 MG/ML injection Inject 50 mg into the skin once a week.     Historical Provider, MD    Review of Systems    As above, he continues have dyspnea on exertion, particularly with inclines. He has not had chest pain, dizziness, syncope, edema, PND, orthopnea, palpitations, or early satiety.  All other systems reviewed and are otherwise negative except as noted above.  Physical Exam    VS:  BP 110/68 (BP Location: Right Arm, Patient Position: Sitting, Cuff Size: Normal)   Pulse 62   Ht 5\' 8"  (1.727 m)   Wt 212 lb 6.4 oz (96.3 kg)   BMI 32.30 kg/m  , BMI Body mass index is 32.3 kg/m. GEN: Well nourished, well developed, in no acute distress.  HEENT: normal.  Neck: Supple, no JVD, carotid bruits, or masses. Cardiac: RRR, no murmurs, rubs, or gallops. No clubbing, cyanosis, edema.  Radials/DP/PT 2+ and equal bilaterally.  Respiratory:  Respirations regular and unlabored, clear  to auscultation bilaterally. GI: Soft, nontender, nondistended, BS + x 4. MS: no deformity or atrophy. Skin: warm and dry, no rash. Neuro:  Strength and sensation are intact. Psych: Normal affect.  Accessory Clinical Findings    2D Echocardiogram 4.26.2018  Study Conclusions   - Left ventricle: The cavity size was normal. There was mild   concentric hypertrophy. Systolic function was normal. The   estimated ejection fraction was in the range of 55% to 60%. Wall   motion was normal; there were no regional wall motion   abnormalities. - Aortic valve: Transvalvular velocity was within the normal range.   There was no stenosis. There was no regurgitation. - Mitral valve: Transvalvular velocity was within the normal range.   There was no evidence for stenosis. There was trivial   regurgitation. - Right ventricle: The cavity size was mildly dilated. Wall   thickness was normal. Systolic function was normal. - Tricuspid valve: There was moderate regurgitation. - Pulmonary arteries: Systolic pressure was moderately increased.   PA peak pressure: 49 mm Hg  (S).   Assessment & Plan    1.  HFpEF: Upon last visit, patient was up 7 pounds from baseline and had significant time overload on exam. After 5 days of doubling his Lasix dose, he has had significant improvement in volume and is euvolemic on exam today. His weight is down from 226 a few weeks ago to 212 today. Despite improvement in volume, he continues to have dyspnea on exertion above his usual baseline amount of dyspnea. We discussed today how dyspnea was his prior anginal equivalent prior to his bypass surgery in 2009. I will arrange for a Lexiscan Myoview. From a heart failure standpoint, his heart rate and blood pressure well controlled and remains on beta blocker, ACE inhibitor, and once daily Lasix therapy.  2. Coronary artery disease: As above, he continues to have dyspnea on exertion despite significant improvement in volume status. I will arrange for [facility]. He remains on beta blocker, ACE inhibitor, and statin therapy. He is not on aspirin secondary to prior history of spontaneous subdural hematoma.  3. Paroxysmal atrial fibrillation: No recent palpitations. EKG on April 2 showed atrial paced with ventricular sensed rhythm. Remains on beta blocker therapy. As above, no anticoagulation the setting of prior spontaneous subdural hematoma.  4. Essential hypertension: Stable on beta blocker, ACE inhibitor, and Lasix.  5. Hyperlipidemia remains on Crestor therapy. Lipids have been followed by primary care. Next  6. Disposition: Follow-up stress testing as noted above. Follow-up with Dr. [name] in one month or sooner if necessary.   Murray Hodgkins, NP 04/17/2017, 1:16 PM

## 2017-04-17 NOTE — Patient Instructions (Signed)
Medication Instructions: Your physician recommends that you continue on your current medications as directed. Please refer to the Current Medication list given to you today.   Testing/Procedures: Your physician has requested that you have a lexiscan myoview. For further information please visit [email or web address]. Please follow instruction sheet, as given.  Follow-Up: Your physician recommends that you schedule a follow-up appointment in: 4-6 weeks with Dr. [name] or Ignacia Bayley, NP (if going to be at [facility])  If you need a refill on your cardiac medications before your next appointment, please call your pharmacy.

## 2017-04-20 ENCOUNTER — Telehealth (HOSPITAL_COMMUNITY): Payer: Self-pay

## 2017-04-20 NOTE — Telephone Encounter (Signed)
Encounter complete. 

## 2017-04-24 ENCOUNTER — Ambulatory Visit (HOSPITAL_COMMUNITY)
Admission: RE | Admit: 2017-04-24 | Discharge: 2017-04-24 | Disposition: A | Discharge status: Home / Self Care | Payer: Medicare Other | Source: Ambulatory Visit | Attending: Cardiology | Admitting: Cardiology

## 2017-04-24 DIAGNOSIS — I2510 Atherosclerotic heart disease of native coronary artery without angina pectoris: Secondary | ICD-10-CM | POA: Diagnosis not present

## 2017-04-24 LAB — MYOCARDIAL PERFUSION IMAGING
LV dias vol: 106 mL (ref 62–150)
LV sys vol: 43 mL
Peak HR: 64 {beats}/min
Rest HR: 60 {beats}/min
SDS: 2
SRS: 3
SSS: 5
TID: 1.18

## 2017-04-24 MED ORDER — REGADENOSON 0.4 MG/5ML IV SOLN
0.4000 mg | Freq: Once | INTRAVENOUS | Status: AC
  Administered 2017-04-24: 0.4 mg via INTRAVENOUS

## 2017-04-24 MED ORDER — TECHNETIUM TC 99M TETROFOSMIN IV KIT
31.3000 | PACK | Freq: Once | INTRAVENOUS | Status: AC | PRN
  Administered 2017-04-24: 31.3 via INTRAVENOUS
  Filled 2017-04-24: qty 32

## 2017-04-24 MED ORDER — AMINOPHYLLINE 25 MG/ML IV SOLN
75.0000 mg | Freq: Once | INTRAVENOUS | Status: AC
  Administered 2017-04-24: 75 mg via INTRAVENOUS

## 2017-04-24 MED ORDER — TECHNETIUM TC 99M TETROFOSMIN IV KIT
10.2000 | PACK | Freq: Once | INTRAVENOUS | Status: AC | PRN
  Administered 2017-04-24: 10.2 via INTRAVENOUS
  Filled 2017-04-24: qty 11

## 2017-05-20 ENCOUNTER — Other Ambulatory Visit: Payer: Self-pay | Admitting: Cardiology

## 2017-05-24 DIAGNOSIS — H01021 Squamous blepharitis right upper eyelid: Secondary | ICD-10-CM | POA: Diagnosis not present

## 2017-05-24 DIAGNOSIS — H353121 Nonexudative age-related macular degeneration, left eye, early dry stage: Secondary | ICD-10-CM | POA: Diagnosis not present

## 2017-05-24 DIAGNOSIS — H01024 Squamous blepharitis left upper eyelid: Secondary | ICD-10-CM | POA: Diagnosis not present

## 2017-05-24 DIAGNOSIS — H353111 Nonexudative age-related macular degeneration, right eye, early dry stage: Secondary | ICD-10-CM | POA: Diagnosis not present

## 2017-05-25 DIAGNOSIS — G4733 Obstructive sleep apnea (adult) (pediatric): Secondary | ICD-10-CM | POA: Diagnosis not present

## 2017-05-25 DIAGNOSIS — I8002 Phlebitis and thrombophlebitis of superficial vessels of left lower extremity: Secondary | ICD-10-CM | POA: Diagnosis not present

## 2017-06-04 DIAGNOSIS — Z125 Encounter for screening for malignant neoplasm of prostate: Secondary | ICD-10-CM | POA: Diagnosis not present

## 2017-06-04 DIAGNOSIS — D696 Thrombocytopenia, unspecified: Secondary | ICD-10-CM | POA: Diagnosis not present

## 2017-06-04 DIAGNOSIS — Z0000 Encounter for general adult medical examination without abnormal findings: Secondary | ICD-10-CM | POA: Diagnosis not present

## 2017-06-04 DIAGNOSIS — E7800 Pure hypercholesterolemia, unspecified: Secondary | ICD-10-CM | POA: Diagnosis not present

## 2017-06-04 DIAGNOSIS — E1121 Type 2 diabetes mellitus with diabetic nephropathy: Secondary | ICD-10-CM | POA: Diagnosis not present

## 2017-06-07 DIAGNOSIS — I2510 Atherosclerotic heart disease of native coronary artery without angina pectoris: Secondary | ICD-10-CM | POA: Diagnosis not present

## 2017-06-07 DIAGNOSIS — R609 Edema, unspecified: Secondary | ICD-10-CM | POA: Diagnosis not present

## 2017-06-07 DIAGNOSIS — N189 Chronic kidney disease, unspecified: Secondary | ICD-10-CM | POA: Diagnosis not present

## 2017-06-07 DIAGNOSIS — I6529 Occlusion and stenosis of unspecified carotid artery: Secondary | ICD-10-CM | POA: Diagnosis not present

## 2017-06-13 DIAGNOSIS — M1990 Unspecified osteoarthritis, unspecified site: Secondary | ICD-10-CM | POA: Diagnosis not present

## 2017-06-13 DIAGNOSIS — M79671 Pain in right foot: Secondary | ICD-10-CM | POA: Diagnosis not present

## 2017-06-13 DIAGNOSIS — M109 Gout, unspecified: Secondary | ICD-10-CM | POA: Diagnosis not present

## 2017-06-13 DIAGNOSIS — R21 Rash and other nonspecific skin eruption: Secondary | ICD-10-CM | POA: Diagnosis not present

## 2017-07-16 DIAGNOSIS — N401 Enlarged prostate with lower urinary tract symptoms: Secondary | ICD-10-CM | POA: Diagnosis not present

## 2017-07-16 DIAGNOSIS — R3915 Urgency of urination: Secondary | ICD-10-CM | POA: Diagnosis not present

## 2017-07-16 DIAGNOSIS — R3914 Feeling of incomplete bladder emptying: Secondary | ICD-10-CM | POA: Diagnosis not present

## 2017-07-16 DIAGNOSIS — R350 Frequency of micturition: Secondary | ICD-10-CM | POA: Diagnosis not present

## 2017-07-26 ENCOUNTER — Ambulatory Visit (HOSPITAL_BASED_OUTPATIENT_CLINIC_OR_DEPARTMENT_OTHER): Payer: Medicare Other | Admitting: Hematology and Oncology

## 2017-07-26 ENCOUNTER — Telehealth: Payer: Self-pay | Admitting: Hematology and Oncology

## 2017-07-26 ENCOUNTER — Other Ambulatory Visit (HOSPITAL_BASED_OUTPATIENT_CLINIC_OR_DEPARTMENT_OTHER): Payer: Medicare Other

## 2017-07-26 ENCOUNTER — Other Ambulatory Visit: Payer: Self-pay | Admitting: Hematology and Oncology

## 2017-07-26 DIAGNOSIS — D696 Thrombocytopenia, unspecified: Secondary | ICD-10-CM

## 2017-07-26 DIAGNOSIS — Z85828 Personal history of other malignant neoplasm of skin: Secondary | ICD-10-CM

## 2017-07-26 DIAGNOSIS — Z6835 Body mass index (BMI) 35.0-35.9, adult: Secondary | ICD-10-CM

## 2017-07-26 DIAGNOSIS — K760 Fatty (change of) liver, not elsewhere classified: Secondary | ICD-10-CM

## 2017-07-26 LAB — CBC WITH DIFFERENTIAL/PLATELET
BASO%: 0.5 % (ref 0.0–2.0)
Basophils Absolute: 0 10*3/uL (ref 0.0–0.1)
EOS%: 4.1 % (ref 0.0–7.0)
Eosinophils Absolute: 0.2 10*3/uL (ref 0.0–0.5)
HCT: 34.9 % — ABNORMAL LOW (ref 38.4–49.9)
HGB: 11.5 g/dL — ABNORMAL LOW (ref 13.0–17.1)
LYMPH%: 24.5 % (ref 14.0–49.0)
MCH: 30.9 pg (ref 27.2–33.4)
MCHC: 33.1 g/dL (ref 32.0–36.0)
MCV: 93.4 fL (ref 79.3–98.0)
MONO#: 0.2 10*3/uL (ref 0.1–0.9)
MONO%: 4.2 % (ref 0.0–14.0)
NEUT#: 3 10*3/uL (ref 1.5–6.5)
NEUT%: 66.7 % (ref 39.0–75.0)
Platelets: 87 10*3/uL — ABNORMAL LOW (ref 140–400)
RBC: 3.73 10*6/uL — ABNORMAL LOW (ref 4.20–5.82)
RDW: 17.5 % — ABNORMAL HIGH (ref 11.0–14.6)
WBC: 4.5 10*3/uL (ref 4.0–10.3)
lymph#: 1.1 10*3/uL (ref 0.9–3.3)

## 2017-07-26 NOTE — Telephone Encounter (Signed)
Gave patient avs and calendar.   °

## 2017-07-27 ENCOUNTER — Encounter: Payer: Self-pay | Admitting: Hematology and Oncology

## 2017-07-27 DIAGNOSIS — Z85828 Personal history of other malignant neoplasm of skin: Secondary | ICD-10-CM | POA: Insufficient documentation

## 2017-07-27 NOTE — Progress Notes (Signed)
[facility] OFFICE PROGRESS NOTE  [name], MD SUMMARY OF HEMATOLOGIC HISTORY:  [name]. is here because of thrombocytopenia. 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  He was found to have abnormal CBC from abnormal blood work from routine blood work monitoring. I have reviewed outside records from his primary care doctor.  He is noted to have mild pancytopenia since 11/17/2015. On 11/17/2015, white blood cell count 5.9, hemoglobin 12.7 and platelet count 106 On 03/13/2016, white blood cell count 6.6, hemoglobin 12.6 and platelet count 103 On 09/06/2015, white blood cell count 6.6, hemoglobin 11.9 and platelet count 102 On 10/25/2016, white count 5.2, hemoglobin 12.7 and platelet count 86 On 11/08/16, white blood cell count 5.3, hemoglobin 12.2 implant, 107. On 09/05/2016, serum ferritin was high at 220, iron study showed serum iron low at 24 and 9% iron saturation.  Some older scanned records dated back to 12/13/2011 showed low platelet count of 101 On 12/21/2011, he had normal platelet count of 210 On 07/30/2012, his platelet count was borderline low at 143  On 09/08/2016, [name], such as spontaneous epistaxis, hematuria, [name]; however, based on his prior extensive surgical history, he probably had transfusion support in the past Overall impression is fatty liver disease secondary to poorly controlled  diabetes and morbid obesity as a cause of his thrombocytopenia INTERVAL HISTORY: [name]. 76 y.o. male returns for further follow-up. He has lost some weight since the last time he was seen here He is active doing everything he wants to do He had recent skin exam with dermatologist. The patient denies any recent signs or symptoms of bleeding such as spontaneous epistaxis, hematuria or hematochezia.  I have reviewed the past medical history, past surgical history, social history and family history with the patient and they are unchanged from previous note.  ALLERGIES:  is allergic to allopurinol and enbrel [etanercept].  MEDICATIONS:  Current Outpatient Prescriptions  Medication Sig Dispense Refill  . allopurinol (ZYLOPRIM) 300 MG tablet Take 1 tablet by mouth daily.    . colchicine 0.6 MG tablet Take 1.2 mg by mouth then 0.6 mg one hour later as needed for gout flares/as directed  1  . furosemide (LASIX) 40 MG tablet Take 40 mg by mouth daily.     [name] lisinopril (PRINIVIL,ZESTRIL) 2.5 MG tablet Take 1 tablet by mouth daily.    . metoprolol (LOPRESSOR) 50 MG tablet TAKE 1 TABLET TWICE DAILY 180 tablet 1  . omega-3 acid ethyl esters (LOVAZA) 1 G capsule Take 2 g by mouth 2 (two) times daily.     [name] oxybutynin (DITROPAN-XL) 10 MG 24 hr tablet Take 10 mg by mouth daily.     . pantoprazole (PROTONIX) 40 MG tablet Take 40 mg by mouth daily.    . potassium chloride SA (K-DUR,KLOR-CON) 20 MEQ tablet Take 20 mEq by mouth daily.      . rosuvastatin (CRESTOR) 40 MG tablet Take 1 tablet (40 mg total) by  mouth daily. MUST KEEP APPOINTMENT 03/29/17 WITH CHRIS BERGE,NP FOR FUTURE REFILLS 30 tablet 1  . sertraline (ZOLOFT) 100 MG tablet Take 100 mg by mouth daily.    . Tamsulosin HCl (FLOMAX) 0.4 MG CAPS Take 0.4 mg by mouth at bedtime.      No current facility-administered medications for this visit.      REVIEW OF SYSTEMS:   Constitutional: Denies fevers, chills or night sweats Eyes: Denies  blurriness of vision Ears, nose, mouth, throat, and face: Denies mucositis or sore throat Respiratory: Denies cough, dyspnea or wheezes Cardiovascular: Denies palpitation, chest discomfort or lower extremity swelling Gastrointestinal:  Denies nausea, heartburn or change in bowel habits Skin: Denies abnormal skin rashes Lymphatics: Denies new lymphadenopathy or easy bruising Neurological:Denies numbness, tingling or new weaknesses Behavioral/Psych: Mood is stable, no new changes  All other systems were reviewed with the patient and are negative.  PHYSICAL EXAMINATION: ECOG PERFORMANCE STATUS: 1 - Symptomatic but completely ambulatory  Vitals:   07/26/17 1512  BP: 112/72  Pulse: 60  Resp: 18  Temp: 98.7 F (37.1 C)  SpO2: 100%   Filed Weights   07/26/17 1512  Weight: 210 lb 14.4 oz (95.7 kg)    GENERAL:alert, no distress and comfortable.  He is morbidly obese SKIN: skin color, texture, turgor are normal, no rashes or significant lesions EYES: normal, Conjunctiva are pink and non-injected, sclera clear Musculoskeletal:no cyanosis of digits and no clubbing  NEURO: alert & oriented x 3 with fluent speech, no focal motor/sensory deficits  LABORATORY DATA:  I have reviewed the data as listed     Component Value Date/Time   NA 140 04/12/2017 1138   NA 141 02/23/2017 1237   K 4.3 04/12/2017 1138   K 3.8 02/23/2017 1237   CL 100 04/12/2017 1138   CO2 24 04/12/2017 1138   CO2 27 02/23/2017 1237   GLUCOSE 99 04/12/2017 1138   GLUCOSE 105 (H) 03/19/2017 1759   GLUCOSE 113 02/23/2017 1237   BUN 19 04/12/2017 1138   BUN 18.3 02/23/2017 1237   CREATININE 1.18 04/12/2017 1138   CREATININE 1.2 02/23/2017 1237   CALCIUM 9.3 04/12/2017 1138   CALCIUM 9.6 02/23/2017 1237   PROT 8.0 02/23/2017 1237   ALBUMIN 4.1 02/23/2017 1237   AST 24 02/23/2017 1237   ALT 15 02/23/2017 1237   ALKPHOS 105 02/23/2017 1237   BILITOT 0.98 02/23/2017 1237   GFRNONAA 60 04/12/2017 1138   GFRAA 70  04/12/2017 1138    No results found for: SPEP, UPEP  Lab Results  Component Value Date   WBC 4.5 07/26/2017   NEUTROABS 3.0 07/26/2017   HGB 11.5 (L) 07/26/2017   HCT 34.9 (L) 07/26/2017   MCV 93.4 07/26/2017   PLT 87 (L) 07/26/2017      Chemistry      Component Value Date/Time   NA 140 04/12/2017 1138   NA 141 02/23/2017 1237   K 4.3 04/12/2017 1138   K 3.8 02/23/2017 1237   CL 100 04/12/2017 1138   CO2 24 04/12/2017 1138   CO2 27 02/23/2017 1237   BUN 19 04/12/2017 1138   BUN 18.3 02/23/2017 1237   CREATININE 1.18 04/12/2017 1138   CREATININE 1.2 02/23/2017 1237      Component Value Date/Time   CALCIUM 9.3 04/12/2017 1138   CALCIUM 9.6 02/23/2017 1237   ALKPHOS 105 02/23/2017 1237   AST 24 02/23/2017 1237   ALT 15 02/23/2017 1237   BILITOT 0.98 02/23/2017 1237  ASSESSMENT & PLAN:  Thrombocytopenia ([place]) 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 Even though the CT imaging report from September 2017 did not disclose this, I reviewed the imaging study with the patient extensively which clearly showed mild liver enlargement and splenomegaly. Rarely, autoimmune disorder that could cause pulmonary fibrosis can also cause mild thrombocytopenia. He is not symptomatic.  Previous autoimmune screen, hepatitis C screening and serum vitamin B12 were within normal limits We discussed importance of weight loss as a way to preserve his liver function.  In my experience, I do see improvement of thrombocytopenia for patients with weight loss alone. For now, there is no excessive risk of bleeding as long as platelet count is greater than 50,000.  Obesity The patient is morbidly obese. He has fatty liver disease based on prior imaging. We discussed importance of weight loss and dietary modification  History of skin cancer The patient has history of skin cancer We discussed the importance of avoiding  excessive sun exposure and to use sunscreen when he goes out   No orders of the defined types were placed in this encounter.   All questions were answered. The patient knows to call the clinic with any problems, questions or concerns. No barriers to learning was detected.  I spent 10 minutes counseling the patient face to face. The total time spent in the appointment was 15 minutes and more than 50% was on counseling.     [name], MD 8/10/20185:33 PM

## 2017-07-27 NOTE — Assessment & Plan Note (Signed)
The patient is morbidly obese. He has fatty liver disease based on prior imaging. We discussed importance of weight loss and dietary modification

## 2017-07-27 NOTE — Assessment & Plan Note (Signed)
The patient has history of skin cancer We discussed the importance of avoiding excessive sun exposure and to use sunscreen when he goes out

## 2017-07-27 NOTE — Assessment & Plan Note (Signed)
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 Even though the CT imaging report from September 2017 did not disclose this, I reviewed the imaging study with the patient extensively which clearly showed mild liver enlargement and splenomegaly. Rarely, autoimmune disorder that could cause pulmonary fibrosis can also cause mild thrombocytopenia. He is not symptomatic.  Previous autoimmune screen, hepatitis C screening and serum vitamin B12 were within normal limits We discussed importance of weight loss as a way to preserve his liver function.   In my experience, I do see improvement of thrombocytopenia for patients with weight loss alone. For now, there is no excessive risk of bleeding as long as platelet count is greater than 50,000. 

## 2017-08-13 ENCOUNTER — Other Ambulatory Visit: Payer: Self-pay | Admitting: Cardiology

## 2017-08-13 NOTE — Telephone Encounter (Signed)
Rx request sent to pharmacy.  

## 2017-08-28 ENCOUNTER — Other Ambulatory Visit: Payer: Self-pay | Admitting: Cardiology

## 2017-08-28 NOTE — Telephone Encounter (Signed)
REFILL 

## 2017-09-11 NOTE — Progress Notes (Signed)
HPI: FU CAD and atrial fibrillation. He underwent CABG (LIMA-LAD, SVG-OM 2, SVG-PDA) along with modified Cox Maze IV procedure in 12/2007. 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 Abdominal US 3/12: No aneurysm. 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 He underwent craniotomy and evacuation by Dr. Saintclair Halsted. He was taken off of Coumadin and no longer felt to be an anticoagulation candidate. Multaq DCed previously as felt causing pulmonary toxixity. Seen 4/18 for acute on chronic diastolic CHF. Echo 4/18 showed normal LV function, mild RVE, moderate TR, moderate pulmonary hypertension. Nuclear study 5/18 showed EF 59 with normal perfusion. Since last seen, he has dyspnea with more vigorous activities but not routine activities. No orthopnea, PND, chest pain or syncope. Occasional mild pedal edema.   Current Outpatient Prescriptions  Medication Sig Dispense Refill  . allopurinol (ZYLOPRIM) 300 MG tablet Take 1 tablet by mouth daily.    . colchicine 0.6 MG tablet Take 1.2 mg by mouth then 0.6 mg one hour later as needed for gout flares/as directed  1  . furosemide (LASIX) 40 MG tablet Take 40 mg by mouth daily.     [name] lisinopril (PRINIVIL,ZESTRIL) 2.5 MG tablet Take 1 tablet by mouth daily.    . metoprolol (LOPRESSOR) 50 MG tablet TAKE 1 TABLET TWICE DAILY 180 tablet 1  . omega-3 acid ethyl esters (LOVAZA) 1 G capsule Take 2 g by mouth 2 (two) times daily.     [name] oxybutynin (DITROPAN-XL) 10 MG 24 hr tablet Take 10 mg by mouth daily.     . pantoprazole (PROTONIX) 40 MG tablet Take 40 mg by mouth daily.    . potassium chloride SA (K-DUR,KLOR-CON) 20 MEQ tablet Take 20 mEq by mouth daily.      . rosuvastatin (CRESTOR) 40 MG tablet TAKE 1 TABLET (40 MG TOTAL) BY MOUTH DAILY. 30 tablet 8  . sertraline (ZOLOFT) 100 MG tablet Take 100 mg by mouth daily.    . Tamsulosin HCl (FLOMAX) 0.4 MG CAPS Take 0.4 mg by mouth at bedtime.       No current facility-administered medications for this visit.      Past Medical History:  Diagnosis Date  . (HFpEF) heart failure with preserved ejection fraction ([place])    a. 05/2013 Echo: EF 55%, mild LVH, diast dysfxn, Ao sclerosis, mildly dil LA, RV dysfxn (poorly visualized), PASP 44mmHg;  b. 03/2017 Echo: EF 55-60%, no rwma, triv MR, mildly dil RV, mod TR, PASP 32mmHg.  . Atrial fibrillation [facility])    s/p Cox Maze 1/09;  Multaq Rx d/c'd in 2014 due to pulmo fibrosis;  coumadin d/c'd in 2014 due to spontaneous subdural hematoma  . BPH (benign prostatic hyperplasia)   . CAD (coronary artery disease), native coronary artery    a. s/p CABG 12/2007;  b. Myoview 12/2011: EF 66%, no scar or ischemia; normal.  . DM (diabetes mellitus) ([place])   . Hyperlipidemia type II   . Hypertension   . OSA (obstructive sleep apnea)   . Pacemaker    PPM - St. Jude  . Pulmonary fibrosis ([place])    Multaq d/c'd 7/14  . Subdural hematoma ([place]) 07/2012   spontaneous;  coumadin d/c'd => no longer a candidate for anticoagulation    Past Surgical History:  Procedure Laterality Date  . APPENDECTOMY    . CHOLECYSTECTOMY    . CORONARY ARTERY BYPASS GRAFT     x3 (left internal mammary artery to distal left anterior descending coronary  artery, saphenous vain graft to second circumflex marginal branch, saphenous vain graft to posterior descending coronary artery, endoscopic saphenous vain harvest from right thigh) and modified Cox - Maze IV procedure.  [name],MD. Electronically signed CHO/MEDQ D: 01/09/2008/ JOB: [id number] cc:  [name]  . [name]  07/30/2012   Procedure: CRANIOTOMY HEMATOMA EVACUATION SUBDURAL;  Surgeon: [name], MD;  Location: [place] [facility]URO ORS;  Service: Neurosurgery;  Laterality: Right;  Right craniotomy for evacuation of subdural hematoma  . FOOT SURGERY    . HERNIA REPAIR    . ORCHIECTOMY     Left  /  testicular cancer  . PACEMAKER PLACEMENT     PPM - St. Jude    Social  History   Social History  . Marital status: Married    Spouse name: [name]  . Number of children: 2  . Years of education: N/A   Occupational History  . retired IT trainer    Social History Main Topics  . Smoking status: Former Smoker    Quit date: 02/21/1991  . Smokeless tobacco: Never Used  . Alcohol use No  . Drug use: No  . Sexual activity: Not Currently   Other Topics Concern  . 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  He is a Engineer, structural.      Family History  Problem Relation Age of Onset  . Heart disease Father   . Heart attack Father   . Heart failure Father   . Heart disease Mother   . Alzheimer's disease Mother     ROS: Balance problems with frequent falls but no fevers or chills, productive cough, hemoptysis, dysphasia, odynophagia, melena, hematochezia, dysuria, hematuria, rash, seizure activity, orthopnea, PND, claudication. Remaining systems are negative.  Physical Exam: Well-developed obese in no acute distress.  Skin is warm and dry. Multiple ecchymoses noted. HEENT is normal.  Neck is supple.  Chest is clear to auscultation with normal expansion.  Cardiovascular exam is regular rate and rhythm.  Abdominal exam nontender or distended. No masses palpated. Extremities show trace edema. neuro grossly intact   A/P  1 Coronary artery disease-continue statin. Most recent nuclear study showed no ischemia. We have not added aspirin given history of spontaneous intracranial hemorrhage. Patient also has mild thrombocytopenia followed by oncology.   2 paroxysmal atrial fibrillation-patient remains in sinus rhythm. Given history of spontaneous intracranial hemorrhage he is not a candidate for anticoagulation. Continue beta blocker. Multaq was discontinued previously as it was felt to be possibly contributing to pulmonary fibrosis. We can consider a different antiarrhythmic in the future if he has more frequent episodes.   3  hypertension-blood pressure is controlled. Continue present medications.  4 hyperlipidemia-continue statin. Check lipids and liver.  5 chronic diastolic congestive heart failure-patient is euvolemic on examination. Continue present dose of diuretic. Check potassium and renal function.   6 Prior pacemaker-followed by electrophysiology.  7 balance problems with frequent falls- May need neurology evaluation in the future. Will leave to primary care.  Kirk Ruths, MD

## 2017-09-24 ENCOUNTER — Ambulatory Visit (INDEPENDENT_AMBULATORY_CARE_PROVIDER_SITE_OTHER): Payer: Medicare Other | Admitting: Cardiology

## 2017-09-24 ENCOUNTER — Encounter: Payer: Self-pay | Admitting: Cardiology

## 2017-09-24 VITALS — BP 130/72 | HR 60 | Ht 68.0 in | Wt 219.0 lb

## 2017-09-24 DIAGNOSIS — I480 Paroxysmal atrial fibrillation: Secondary | ICD-10-CM | POA: Diagnosis not present

## 2017-09-24 DIAGNOSIS — I5030 Unspecified diastolic (congestive) heart failure: Secondary | ICD-10-CM

## 2017-09-24 DIAGNOSIS — I2510 Atherosclerotic heart disease of native coronary artery without angina pectoris: Secondary | ICD-10-CM

## 2017-09-24 DIAGNOSIS — Z950 Presence of cardiac pacemaker: Secondary | ICD-10-CM

## 2017-09-24 DIAGNOSIS — E785 Hyperlipidemia, unspecified: Secondary | ICD-10-CM

## 2017-09-24 DIAGNOSIS — I10 Essential (primary) hypertension: Secondary | ICD-10-CM | POA: Diagnosis not present

## 2017-09-24 LAB — BASIC METABOLIC PANEL
BUN/Creatinine Ratio: 14 (ref 10–24)
BUN: 18 mg/dL (ref 8–27)
CO2: 23 mmol/L (ref 20–29)
Calcium: 8.9 mg/dL (ref 8.6–10.2)
Chloride: 107 mmol/L — ABNORMAL HIGH (ref 96–106)
Creatinine, Ser: 1.31 mg/dL — ABNORMAL HIGH (ref 0.76–1.27)
GFR calc Af Amer: 62 mL/min/{1.73_m2} (ref >59)
GFR calc non Af Amer: 53 mL/min/{1.73_m2} — ABNORMAL LOW (ref >59)
Glucose: 106 mg/dL — ABNORMAL HIGH (ref 65–99)
Potassium: 4.2 mmol/L (ref 3.5–5.2)
Sodium: 144 mmol/L (ref 134–144)

## 2017-09-24 LAB — LIPID PANEL
Chol/HDL Ratio: 2.8 ratio (ref 0.0–5.0)
Cholesterol, Total: 119 mg/dL (ref 100–199)
HDL: 42 mg/dL (ref >39)
LDL Calculated: 58 mg/dL (ref 0–99)
Triglycerides: 94 mg/dL (ref 0–149)
VLDL Cholesterol Cal: 19 mg/dL (ref 5–40)

## 2017-09-24 LAB — HEPATIC FUNCTION PANEL
ALT: 16 IU/L (ref 0–44)
AST: 25 IU/L (ref 0–40)
Albumin: 4.1 g/dL (ref 3.5–4.8)
Alkaline Phosphatase: 91 IU/L (ref 39–117)
Bilirubin Total: 0.6 mg/dL (ref 0.0–1.2)
Bilirubin, Direct: 0.22 mg/dL (ref 0.00–0.40)
Total Protein: 7.1 g/dL (ref 6.0–8.5)

## 2017-09-24 NOTE — Patient Instructions (Addendum)
Medication Instructions:  Your physician recommends that you continue on your current medications as directed. Please refer to the Current Medication list given to you today.  Labwork: LP/BMET/HFP   Testing/Procedures: none  Follow-Up: Your physician wants you to follow-up in: [place] will receive a reminder letter in the mail two months in advance. If you don't receive a letter, please call our office to schedule the follow-up appointment.  If you need a refill on your cardiac medications before your next appointment, please call your pharmacy.

## 2017-11-19 DIAGNOSIS — I509 Heart failure, unspecified: Secondary | ICD-10-CM | POA: Diagnosis not present

## 2017-11-19 DIAGNOSIS — R600 Localized edema: Secondary | ICD-10-CM | POA: Diagnosis not present

## 2017-11-23 DIAGNOSIS — C159 Malignant neoplasm of esophagus, unspecified: Secondary | ICD-10-CM | POA: Diagnosis not present

## 2017-11-27 DIAGNOSIS — R600 Localized edema: Secondary | ICD-10-CM | POA: Diagnosis not present

## 2017-11-27 DIAGNOSIS — I509 Heart failure, unspecified: Secondary | ICD-10-CM | POA: Diagnosis not present

## 2017-11-28 ENCOUNTER — Other Ambulatory Visit: Payer: Self-pay | Admitting: Cardiology

## 2017-11-29 DIAGNOSIS — I2510 Atherosclerotic heart disease of native coronary artery without angina pectoris: Secondary | ICD-10-CM | POA: Diagnosis not present

## 2017-11-29 DIAGNOSIS — I5030 Unspecified diastolic (congestive) heart failure: Secondary | ICD-10-CM | POA: Diagnosis not present

## 2017-11-29 DIAGNOSIS — R0602 Shortness of breath: Secondary | ICD-10-CM | POA: Diagnosis not present

## 2017-12-05 DIAGNOSIS — I10 Essential (primary) hypertension: Secondary | ICD-10-CM | POA: Diagnosis not present

## 2017-12-12 ENCOUNTER — Ambulatory Visit (INDEPENDENT_AMBULATORY_CARE_PROVIDER_SITE_OTHER): Payer: Medicare Other | Admitting: Physician Assistant

## 2017-12-12 ENCOUNTER — Encounter: Payer: Self-pay | Admitting: Physician Assistant

## 2017-12-12 VITALS — BP 114/68 | HR 60 | Ht 68.0 in | Wt 217.0 lb

## 2017-12-12 DIAGNOSIS — E785 Hyperlipidemia, unspecified: Secondary | ICD-10-CM | POA: Diagnosis not present

## 2017-12-12 DIAGNOSIS — Z79899 Other long term (current) drug therapy: Secondary | ICD-10-CM | POA: Diagnosis not present

## 2017-12-12 DIAGNOSIS — I5032 Chronic diastolic (congestive) heart failure: Secondary | ICD-10-CM

## 2017-12-12 DIAGNOSIS — I25810 Atherosclerosis of coronary artery bypass graft(s) without angina pectoris: Secondary | ICD-10-CM | POA: Diagnosis not present

## 2017-12-12 DIAGNOSIS — I10 Essential (primary) hypertension: Secondary | ICD-10-CM

## 2017-12-12 DIAGNOSIS — I4891 Unspecified atrial fibrillation: Secondary | ICD-10-CM | POA: Diagnosis not present

## 2017-12-12 DIAGNOSIS — R600 Localized edema: Secondary | ICD-10-CM

## 2017-12-12 DIAGNOSIS — Z950 Presence of cardiac pacemaker: Secondary | ICD-10-CM

## 2017-12-12 DIAGNOSIS — E119 Type 2 diabetes mellitus without complications: Secondary | ICD-10-CM

## 2017-12-12 DIAGNOSIS — R0989 Other specified symptoms and signs involving the circulatory and respiratory systems: Secondary | ICD-10-CM

## 2017-12-12 LAB — BASIC METABOLIC PANEL
BUN/Creatinine Ratio: 16 (ref 10–24)
BUN: 20 mg/dL (ref 8–27)
CO2: 23 mmol/L (ref 20–29)
Calcium: 9.2 mg/dL (ref 8.6–10.2)
Chloride: 106 mmol/L (ref 96–106)
Creatinine, Ser: 1.26 mg/dL (ref 0.76–1.27)
GFR calc Af Amer: 65 mL/min/{1.73_m2} (ref >59)
GFR calc non Af Amer: 56 mL/min/{1.73_m2} — ABNORMAL LOW (ref >59)
Glucose: 100 mg/dL — ABNORMAL HIGH (ref 65–99)
Potassium: 4.5 mmol/L (ref 3.5–5.2)
Sodium: 145 mmol/L — ABNORMAL HIGH (ref 134–144)

## 2017-12-12 MED ORDER — FUROSEMIDE 40 MG PO TABS: 40.0000 mg | ORAL_TABLET | Freq: Two times a day (BID) | ORAL | 3 refills | Status: DC

## 2017-12-12 NOTE — Patient Instructions (Addendum)
Medication Instructions:   INCREASE lasix from 60mg  DAILY to 40mg  TWICE DAILY.  Obtain compression stockings. You may obtain these at most retail pharmacies. We suggested starting with stockings which having a pressure rating of 20-30 mmHg. If tolerated well, please try to increase to higher rating 30-90mmHg.  Labwork:   BMET today and repeat in 1 week. Further medication instructions to be determined by results of labwork.  Testing/Procedures:  Your physician has requested that you have a carotid duplex. This test is an ultrasound of the carotid arteries in your neck. It looks at blood flow through these arteries that supply the brain with blood. Allow one hour for this exam. There are no restrictions or special instructions.  Follow-Up:  In 3-4 weeks with [name], PA   If you need a refill on your cardiac medications before your next appointment, please call your pharmacy.

## 2017-12-12 NOTE — Progress Notes (Addendum)
Cardiology Office Note    Date:  12/14/2017   ID:  Jamie Singleton., DOB 10-Mar-1941, MRN [id number]  PCP:  [name], MD  Cardiologist: Dr. [name]   Chief Complaint  Patient presents with  . Follow-up    seen for Dr. [name]. LE edema  . Shortness of Breath  . Edema    History of Present Illness:  Jamie Onofre Gains. is a 75 y.o. male with PMH of CAD s/p CABG 12/2007, HTN, HLD, DM II, PAF, symptomatic bradycardia s/p PPM, OSA and h/o pulmonary fibrosis.  He underwent CABG with LIMA to LAD, SVG to OM 2, SVG to PDA along with modified Cox Maze IV procedure in 12/2007.  He eventually underwent pacemaker implantation for sinus node dysfunction and symptomatic bradycardia.  Abdominal ultrasound in March 2012 showed no aneurysm.  He suffered a spontaneous subdural hematoma in August 2013 and underwent a craniotomy and evaluation by Dr. Saintclair Halsted.  He was eventually taken off of Coumadin and no longer felt to be an anticoagulation candidate.  Multaq discontinued previously due to possible pulmonary toxicity.  He was seen in April 2018 for acute on chronic diastolic heart failure.  Echocardiogram obtained at the time showed normal LV function, moderate TR and moderate pulmonary hypertension.  Myoview in May 2018 showed EF 59%, normal perfusion.  He was last seen by Dr. [name] on 09/24/2017, at which time she was doing well.  Six-month follow-up was recommended.  More recently, he had increasing lower extremity edema.  His primary care provider placed him on Lasix 40 mg 3 times weekly.  A repeat echocardiogram was done at outside facility on 11/29/2017, this showed EF 65-70%, mild LVH, normal RV EF, mild to moderate TR, RV systolic pressure 51-88, mild LAE.  Patient presents today for cardiology office visit.  For the past several weeks, he has been on 60 mg daily of Lasix according to both the patient and his wife.  Lower extremity edema has significantly improved, although continue to have 1+  pitting edema.  He has baseline dyspnea on exertion, this has been unchanged for the past 2 years.  He denies any chest pain.  EKG is unchanged.  On physical exam, for some reason he has some bruit on the left carotid side, I have a plan to obtain a carotid ultrasound.  As far as his lower extremity edema, I think majority of the swelling is related to in venous insufficiency, however I want to do a trial of higher dose of diuretic to see if able to improve.  I would increase his Lasix to 40 mg twice daily with basic metabolic panel today and also in 1 week.  If creatinine trends up, I will back the Lasix down to 60 mg daily.  During the meantime I think he would benefit from compression stocking as well.  I will see him back in 3-4 weeks.   Past Medical History:  Diagnosis Date  . (HFpEF) heart failure with preserved ejection fraction ([place])    a. 05/2013 Echo: EF 55%, mild LVH, diast dysfxn, Ao sclerosis, mildly dil LA, RV dysfxn (poorly visualized), PASP 46mmHg;  b. 03/2017 Echo: EF 55-60%, no rwma, triv MR, mildly dil RV, mod TR, PASP 71mmHg.  . Atrial fibrillation [facility])    s/p Cox Maze 1/09;  Multaq Rx d/c'd in 2014 due to pulmo fibrosis;  coumadin d/c'd in 2014 due to spontaneous subdural hematoma  . BPH (benign prostatic hyperplasia)   . CAD (coronary artery  disease), native coronary artery    a. s/p CABG 12/2007;  b. Myoview 12/2011: EF 66%, no scar or ischemia; normal.  . DM (diabetes mellitus) ([place])   . Hyperlipidemia type II   . Hypertension   . OSA (obstructive sleep apnea)   . Pacemaker    PPM - St. Jude  . Pulmonary fibrosis ([place])    Multaq d/c'd 7/14  . Subdural hematoma ([place]) 07/2012   spontaneous;  coumadin d/c'd => no longer a candidate for anticoagulation    Past Surgical History:  Procedure Laterality Date  . APPENDECTOMY    . CHOLECYSTECTOMY    . CORONARY ARTERY BYPASS GRAFT     x3 (left internal mammary artery to distal left anterior descending coronary artery, saphenous  vain graft to second circumflex marginal branch, saphenous vain graft to posterior descending coronary artery, endoscopic saphenous vain harvest from right thigh) and modified Cox - Maze IV procedure.  Jamie Gu. [name],MD. Electronically signed CHO/MEDQ D: 01/09/2008/ JOB: [id number] cc:  Jamie Sizer MD  . [name]  07/30/2012   Procedure: CRANIOTOMY HEMATOMA EVACUATION SUBDURAL;  Surgeon: Jamie Hoops, MD;  Location: [facility];  Service: Neurosurgery;  Laterality: Right;  Right craniotomy for evacuation of subdural hematoma  . FOOT SURGERY    . HERNIA REPAIR    . ORCHIECTOMY     Left  /  testicular cancer  . PACEMAKER PLACEMENT     PPM - St. Jude    Current Medications: Outpatient Medications Prior to Visit  Medication Sig Dispense Refill  . allopurinol (ZYLOPRIM) 300 MG tablet Take 1 tablet by mouth daily.    . colchicine 0.6 MG tablet Take 1.2 mg by mouth then 0.6 mg one hour later as needed for gout flares/as directed  1  . lisinopril (PRINIVIL,ZESTRIL) 2.5 MG tablet Take 1 tablet by mouth daily.    . metoprolol tartrate (LOPRESSOR) 50 MG tablet TAKE 1 TABLET TWICE DAILY 180 tablet 1  . omega-3 acid ethyl esters (LOVAZA) 1 G capsule Take 2 g by mouth 2 (two) times daily.     [name] oxybutynin (DITROPAN-XL) 10 MG 24 hr tablet Take 10 mg by mouth daily.     . pantoprazole (PROTONIX) 40 MG tablet Take 40 mg by mouth daily.    . potassium chloride SA (K-DUR,KLOR-CON) 20 MEQ tablet Take 20 mEq by mouth daily.      . rosuvastatin (CRESTOR) 40 MG tablet TAKE 1 TABLET (40 MG TOTAL) BY MOUTH DAILY. 30 tablet 8  . sertraline (ZOLOFT) 100 MG tablet Take 100 mg by mouth daily.    . Tamsulosin HCl (FLOMAX) 0.4 MG CAPS Take 0.4 mg by mouth at bedtime.     . furosemide (LASIX) 40 MG tablet Take 40 mg by mouth daily.      No facility-administered medications prior to visit.      Allergies:   Allopurinol and Enbrel [etanercept]   Social History   Socioeconomic History  . Marital status: Married      Spouse name: [name]  . Number of children: 2  . Years of education: None  . Highest education level: None  Social Needs  . Financial resource strain: None  . Food insecurity - worry: None  . Food insecurity - inability: None  . Transportation needs - medical: None  . Transportation needs - non-medical: None  Occupational History  . Occupation: retired IT trainer  Tobacco Use  . Smoking status: Former Smoker    Last attempt to quit: 02/21/1991  Years since quitting: 26.8  . Smokeless tobacco: Never Used  Substance and Sexual Activity  . Alcohol use: No    Alcohol/week: 0.0 oz  . Drug use: No  . Sexual activity: Not Currently  Other Topics Concern  . None  Social History Narrative   Married with two children.  He is a Engineer, structural.       Family History:  The patient's family history includes Alzheimer's disease in his mother; Heart attack in his father; Heart disease in his father and mother; Heart failure in his father.   ROS:   Please see the history of present illness.    ROS All other systems reviewed and are negative.   PHYSICAL EXAM:   VS:  BP 114/68   Pulse 60   Ht 5\' 8"  (1.727 m)   Wt 217 lb (98.4 kg)   BMI 32.99 kg/m    GEN: Well nourished, well developed, in no acute distress  HEENT: normal  Neck: no JVD, carotid bruits, or masses Cardiac: RRR; no murmurs, rubs, or gallops,no edema  Respiratory:  clear to auscultation bilaterally, normal work of breathing GI: soft, nontender, nondistended, + BS MS: no deformity or atrophy  Skin: warm and dry, no rash Neuro:  Alert and Oriented x 3, Strength and sensation are intact Psych: euthymic mood, full affect  Wt Readings from Last 3 Encounters:  12/12/17 217 lb (98.4 kg)  09/24/17 219 lb (99.3 kg)  07/26/17 210 lb 14.4 oz (95.7 kg)      Studies/Labs Reviewed:   EKG:  EKG is ordered today.  The ekg ordered today demonstrates atrial paced rhythm, right bundle branch block, heart rate 60  Recent  Labs: 03/19/2017: B Natriuretic Peptide 224.5 07/26/2017: HGB 11.5; Platelets 87 09/24/2017: ALT 16 12/12/2017: BUN 20; Creatinine, Ser 1.26; Potassium 4.5; Sodium 145   Lipid Panel    Component Value Date/Time   CHOL 119 09/24/2017 0824   TRIG 94 09/24/2017 0824   HDL 42 09/24/2017 0824   CHOLHDL 2.8 09/24/2017 0824   CHOLHDL 4 12/01/2013 0843   VLDL 37.6 12/01/2013 0843   LDLCALC 58 09/24/2017 0824   LDLDIRECT 154.2 10/20/2013 0846    Additional studies/ records that were reviewed today include:   Echo 04/12/2017 LV EF: 55% -   60%  ------------------------------------------------------------------- Indications:      B15.17 Acute Diastolic Heart Failure.  ECHO WITH DEFINITY.  ------------------------------------------------------------------- History:   PMH:  Acquired from the patient and from the patient&'s chart.  PMH:  Atrial fibrillation. CAD. Obstructive Sleep Apnea. Risk factors:  Hypertension. Diabetes mellitus. Dyslipidemia.  ------------------------------------------------------------------- Study Conclusions  - Left ventricle: The cavity size was normal. There was mild   concentric hypertrophy. Systolic function was normal. The   estimated ejection fraction was in the range of 55% to 60%. Wall   motion was normal; there were no regional wall motion   abnormalities. - Aortic valve: Transvalvular velocity was within the normal range.   There was no stenosis. There was no regurgitation. - Mitral valve: Transvalvular velocity was within the normal range.   There was no evidence for stenosis. There was trivial   regurgitation. - Right ventricle: The cavity size was mildly dilated. Wall   thickness was normal. Systolic function was normal. - Tricuspid valve: There was moderate regurgitation. - Pulmonary arteries: Systolic pressure was moderately increased.   PA peak pressure: 49 mm Hg (S).   ASSESSMENT:    1. Lower extremity edema   2. Atrial fibrillation,  unspecified  type ([place])   3. Encounter for long-term (current) use of medications   4. Bruit of left carotid artery   5. Coronary artery disease involving coronary bypass graft of native heart without angina pectoris   6. Pacemaker   7. Essential hypertension   8. Hyperlipidemia, unspecified hyperlipidemia type   9. Controlled type 2 diabetes mellitus without complication, without long-term current use of insulin ([place])   10. Chronic diastolic heart failure (HCC)      PLAN:  In order of problems listed above:  1. Lower extremity edema/chronic diastolic heart failure: Recently Lasix was increased to 60 mg daily, lower extremity edema has significantly improved, however continue to have at least 1+ pitting edema.  I will do a trial with higher dose of Lasix, 40 mg twice daily.  A lot of his lower extremity issue likely is related to venous insufficiency, if creatinine increases after 1 week, I will scale the Lasix back to 60 mg daily.  He should wear a compression stocking as well.  2. Proximal atrial fibrillation: s/p MAZE procedure. Not a Coumadin candidate due to history of spontaneous subdural hematoma.  Off Multaq due to concern of side effect related to pulmonary fibrosis.    3. CAD s/p CABG: Myoview in May 2018 was normal.  Continues to have baseline dyspnea on exertion, this has not changed recently.  4. Left carotid bruit: Interestingly, I could only hear the bruit when he is sitting up.  Will obtain carotid ultrasound  5. Sinus nodal dysfunction: s/p Jude PPM  6. Hypertension: Blood pressure very well controlled  7. Hyperlipidemia: On Crestor 40 mg daily, last lipid panel 09/24/2017 total cholesterol 119, triglyceride 94, HDL 42, LDL 58.  Very well controlled.  8. DM 2: Managed by primary care provider.  Currently not on any medication for diabetes.    Medication Adjustments/Labs and Tests Ordered: Current medicines are reviewed at length with the patient today.  Concerns  regarding medicines are outlined above.  Medication changes, Labs and Tests ordered today are listed in the Patient Instructions below. Patient Instructions  Medication Instructions:   INCREASE lasix from 60mg  DAILY to 40mg  TWICE DAILY.  Obtain compression stockings. You may obtain these at most retail pharmacies. We suggested starting with stockings which having a pressure rating of 20-30 mmHg. If tolerated well, please try to increase to higher rating 30-52mmHg.  Labwork:   BMET today and repeat in 1 week. Further medication instructions to be determined by results of labwork.  Testing/Procedures:  Your physician has requested that you have a carotid duplex. This test is an ultrasound of the carotid arteries in your neck. It looks at blood flow through these arteries that supply the brain with blood. Allow one hour for this exam. There are no restrictions or special instructions.  Follow-Up:  In 3-4 weeks with [name], PA   If you need a refill on your cardiac medications before your next appointment, please call your pharmacy.    [name], [place]  12/14/2017 9:05 AM    [address], [place], [place]  [address] Phone: [phone]; Fax: [phone]

## 2017-12-13 NOTE — Progress Notes (Signed)
Kidney function stable. Although sodium borderline high, however I do not think this would not cause any issue and should be self limiting.

## 2017-12-14 ENCOUNTER — Encounter: Payer: Self-pay | Admitting: Physician Assistant

## 2017-12-25 ENCOUNTER — Ambulatory Visit (HOSPITAL_COMMUNITY)
Admission: RE | Admit: 2017-12-25 | Discharge: 2017-12-25 | Disposition: A | Discharge status: Home / Self Care | Payer: Medicare Other | Source: Ambulatory Visit | Attending: Cardiovascular Disease | Admitting: Cardiovascular Disease

## 2017-12-25 DIAGNOSIS — R0989 Other specified symptoms and signs involving the circulatory and respiratory systems: Secondary | ICD-10-CM | POA: Diagnosis not present

## 2017-12-25 DIAGNOSIS — I6523 Occlusion and stenosis of bilateral carotid arteries: Secondary | ICD-10-CM | POA: Insufficient documentation

## 2017-12-26 NOTE — Progress Notes (Signed)
Despite the fact I was able to hear bruit on physical exam, the carotid disease is very mild.

## 2017-12-31 DIAGNOSIS — D696 Thrombocytopenia, unspecified: Secondary | ICD-10-CM | POA: Diagnosis not present

## 2017-12-31 DIAGNOSIS — R600 Localized edema: Secondary | ICD-10-CM | POA: Diagnosis not present

## 2017-12-31 DIAGNOSIS — E1121 Type 2 diabetes mellitus with diabetic nephropathy: Secondary | ICD-10-CM | POA: Diagnosis not present

## 2018-01-02 DIAGNOSIS — R600 Localized edema: Secondary | ICD-10-CM | POA: Diagnosis not present

## 2018-01-03 ENCOUNTER — Telehealth: Payer: Self-pay | Admitting: Cardiology

## 2018-01-03 NOTE — Telephone Encounter (Signed)
Able to reach patient on cell number and reviewed results

## 2018-01-03 NOTE — Telephone Encounter (Signed)
Left detailed message, ok per DPR   Notes recorded by Harold Hedge, CMA on 01/02/2018 at 1:47 PM EST [email or web address] ------  Notes recorded by [name], PA on 12/26/2017 at 5:33 PM EST Despite the fact I was able to hear bruit on physical exam, the carotid disease is very mild.

## 2018-01-03 NOTE — Telephone Encounter (Signed)
Pt says he is returning a call from yesterday,he does not know who called him.

## 2018-01-09 ENCOUNTER — Telehealth: Payer: Self-pay

## 2018-01-09 DIAGNOSIS — Z79899 Other long term (current) drug therapy: Secondary | ICD-10-CM | POA: Diagnosis not present

## 2018-01-09 NOTE — Telephone Encounter (Signed)
Spoke with patient and informed him to have labs one day this week. Patient states he will be here shortly.

## 2018-01-10 LAB — BASIC METABOLIC PANEL
BUN/Creatinine Ratio: 15 (ref 10–24)
BUN: 18 mg/dL (ref 8–27)
CO2: 24 mmol/L (ref 20–29)
Calcium: 9.5 mg/dL (ref 8.6–10.2)
Chloride: 105 mmol/L (ref 96–106)
Creatinine, Ser: 1.18 mg/dL (ref 0.76–1.27)
GFR calc Af Amer: 69 mL/min/{1.73_m2} (ref >59)
GFR calc non Af Amer: 60 mL/min/{1.73_m2} (ref >59)
Glucose: 132 mg/dL — ABNORMAL HIGH (ref 65–99)
Potassium: 4.5 mmol/L (ref 3.5–5.2)
Sodium: 144 mmol/L (ref 134–144)

## 2018-01-15 ENCOUNTER — Encounter: Payer: Self-pay | Admitting: Physician Assistant

## 2018-01-15 ENCOUNTER — Ambulatory Visit (INDEPENDENT_AMBULATORY_CARE_PROVIDER_SITE_OTHER): Payer: Medicare Other | Admitting: Physician Assistant

## 2018-01-15 VITALS — BP 102/64 | HR 60 | Ht 68.0 in | Wt 214.4 lb

## 2018-01-15 DIAGNOSIS — J8410 Pulmonary fibrosis, unspecified: Secondary | ICD-10-CM | POA: Diagnosis not present

## 2018-01-15 DIAGNOSIS — E119 Type 2 diabetes mellitus without complications: Secondary | ICD-10-CM

## 2018-01-15 DIAGNOSIS — I10 Essential (primary) hypertension: Secondary | ICD-10-CM | POA: Diagnosis not present

## 2018-01-15 DIAGNOSIS — I25810 Atherosclerosis of coronary artery bypass graft(s) without angina pectoris: Secondary | ICD-10-CM | POA: Diagnosis not present

## 2018-01-15 DIAGNOSIS — I480 Paroxysmal atrial fibrillation: Secondary | ICD-10-CM | POA: Diagnosis not present

## 2018-01-15 DIAGNOSIS — E785 Hyperlipidemia, unspecified: Secondary | ICD-10-CM | POA: Diagnosis not present

## 2018-01-15 DIAGNOSIS — I951 Orthostatic hypotension: Secondary | ICD-10-CM | POA: Diagnosis not present

## 2018-01-15 DIAGNOSIS — Z5189 Encounter for other specified aftercare: Secondary | ICD-10-CM

## 2018-01-15 DIAGNOSIS — Z950 Presence of cardiac pacemaker: Secondary | ICD-10-CM | POA: Diagnosis not present

## 2018-01-15 NOTE — Patient Instructions (Signed)
Medication Instructions:  DISCONTINUE Lisinopril  Labwork: Your physician recommends that you return for lab work in: 2 weeks recheck BMP  Testing/Procedures: None   Follow-Up: Your physician recommends that you schedule a follow-up appointment in: 3-4 Months with Dr [name]  Any Other Special Instructions Will Be Listed Below (If Applicable). If you need a refill on your cardiac medications before your next appointment, please call your pharmacy.

## 2018-01-15 NOTE — Progress Notes (Signed)
Cardiology Office Note    Date:  01/16/2018   ID:  Jamie Singleton., DOB May 03, 1941, MRN [id number]  PCP:  [name], MD  Cardiologist:  Dr. [name]  Chief Complaint  Patient presents with  . Follow-up    pt reports itchyness-legs, comes and goes. continued swelling in the pm.    History of Present Illness:  [name]. is a 77 y.o. male with PMH of CAD s/p CABG 12/2007, HTN, HLD, DM II, PAF, symptomatic bradycardia s/p PPM, OSA and h/o pulmonary fibrosis.  He underwent CABG with LIMA to LAD, SVG to OM 2, SVG to PDA along with modified Cox Maze IV procedure in 12/2007.  He eventually underwent pacemaker implantation for sinus node dysfunction and symptomatic bradycardia.  Abdominal ultrasound in March 2012 showed no aneurysm.  He suffered a spontaneous subdural hematoma in August 2013 and underwent a craniotomy and evaluation by Dr. Saintclair Halsted.  He was eventually taken off of Coumadin and no longer felt to be an anticoagulation candidate.  Multaq discontinued previously due to possible pulmonary toxicity.  He was seen in April 2018 for acute on chronic diastolic heart failure.  Echocardiogram obtained at the time showed normal LV function, moderate TR and moderate pulmonary hypertension.  Myoview in May 2018 showed EF 59%, normal perfusion.  More recently, his primary care provider placed him on Lasix 40 mg 3 times weekly due to increased lower extremity edema.  A repeat echocardiogram was done at outside facility on 11/29/2017 showed EF 65-70%, mild LVH, normal RVEF, mild to moderate TR, RV systolic pressure 93-90 mmHg, mild LAE.  During the last visit, he was already on 60 mg daily of Lasix, he continued to have 1+ pitting edema.  As a trial, I increased his Lasix to 40 mg twice daily.  Carotid Doppler obtained on 12/25/2017 was normal.  Patient presents today for cardiology office visit.  After increasing his Lasix to 40 mg twice daily, he has been tolerating it very well.  On physical  exam, he does not have significant lower extremity edema today. His LE swelling tend to be worse the longer he is on his feet, likely due to some degree of venous insufficiency.  He is getting up about once a night to go to the bathroom usually around 4-5 AM.  I will continue on the current dose of the diuretic.  He denies any chest pain.  He has some baseline dyspnea on exertion, this has not changed in the past year.  He is still dealing with some balance issues.  He denies any dizziness.  In the past 3 months, he has fallen about twice a month.  He says his balance issue is the worst when he changed body positions and when he first gets up in the morning.  We did obtain orthostatic vital signs in the office today, his systolic and diastolic blood pressure decreased slightly with changing body positions.  I have discontinued his 2.5 mg lisinopril.  I will obtain a basic metabolic panel in 2 weeks to make sure there is no significant change in his potassium.  Dr. [name] also pointed to possible neurological workup in the future if his symptom worsens.  Orthostatic vital sign Lying100/62 heart rate 59 Sitting 102/63 heart rate 60 Standing 86/48 heart rate 59    Past Medical History:  Diagnosis Date  . (HFpEF) heart failure with preserved ejection fraction ([place])    a. 05/2013 Echo: EF 55%, mild LVH, diast dysfxn, Ao  sclerosis, mildly dil LA, RV dysfxn (poorly visualized), PASP 57mmHg;  b. 03/2017 Echo: EF 55-60%, no rwma, triv MR, mildly dil RV, mod TR, PASP 86mmHg.  . Atrial fibrillation [facility])    s/p Cox Maze 1/09;  Multaq Rx d/c'd in 2014 due to pulmo fibrosis;  coumadin d/c'd in 2014 due to spontaneous subdural hematoma  . BPH (benign prostatic hyperplasia)   . CAD (coronary artery disease), native coronary artery    a. s/p CABG 12/2007;  b. Myoview 12/2011: EF 66%, no scar or ischemia; normal.  . DM (diabetes mellitus) ([place])   . Hyperlipidemia type II   . Hypertension   . OSA (obstructive sleep  apnea)   . Pacemaker    PPM - St. Jude  . Pulmonary fibrosis ([place])    Multaq d/c'd 7/14  . Subdural hematoma ([place]) 07/2012   spontaneous;  coumadin d/c'd => no longer a candidate for anticoagulation    Past Surgical History:  Procedure Laterality Date  . APPENDECTOMY    . CHOLECYSTECTOMY    . CORONARY ARTERY BYPASS GRAFT     x3 (left internal mammary artery to distal left anterior descending coronary artery, saphenous vain graft to second circumflex marginal branch, saphenous vain graft to posterior descending coronary artery, endoscopic saphenous vain harvest from right thigh) and modified Cox - Maze IV procedure.  [name],MD. Electronically signed CHO/MEDQ D: 01/09/2008/ JOB: [id number] cc:  [name]  . [name]  07/30/2012   Procedure: CRANIOTOMY HEMATOMA EVACUATION SUBDURAL;  Surgeon: [name], MD;  Location: [place] [facility];  Service: Neurosurgery;  Laterality: Right;  Right craniotomy for evacuation of subdural hematoma  . FOOT SURGERY    . HERNIA REPAIR    . ORCHIECTOMY     Left  /  testicular cancer  . PACEMAKER PLACEMENT     PPM - St. Jude    Current Medications: Outpatient Medications Prior to Visit  Medication Sig Dispense Refill  . allopurinol (ZYLOPRIM) 300 MG tablet Take 1 tablet by mouth daily.    . colchicine 0.6 MG tablet Take 1.2 mg by mouth then 0.6 mg one hour later as needed for gout flares/as directed  1  . furosemide (LASIX) 40 MG tablet Take 1 tablet (40 mg total) by mouth 2 (two) times daily. 60 tablet 3  . metoprolol tartrate (LOPRESSOR) 50 MG tablet TAKE 1 TABLET TWICE DAILY 180 tablet 1  . omega-3 acid ethyl esters (LOVAZA) 1 G capsule Take 2 g by mouth 2 (two) times daily.     [name] oxybutynin (DITROPAN-XL) 10 MG 24 hr tablet Take 10 mg by mouth daily.     . pantoprazole (PROTONIX) 40 MG tablet Take 40 mg by mouth daily.    . potassium chloride SA (K-DUR,KLOR-CON) 20 MEQ tablet Take 20 mEq by mouth daily.      . rosuvastatin (CRESTOR) 40 MG  tablet TAKE 1 TABLET (40 MG TOTAL) BY MOUTH DAILY. 30 tablet 8  . sertraline (ZOLOFT) 100 MG tablet Take 100 mg by mouth daily.    . Tamsulosin HCl (FLOMAX) 0.4 MG CAPS Take 0.4 mg by mouth at bedtime.     [name] lisinopril (PRINIVIL,ZESTRIL) 2.5 MG tablet Take 1 tablet by mouth daily.     No facility-administered medications prior to visit.      Allergies:   Allopurinol and Enbrel [etanercept]   Social History   Socioeconomic History  . Marital status: Married    Spouse name: [name]  . Number of children:  2  . Years of education: None  . Highest education level: None  Social Needs  . Financial resource strain: None  . Food insecurity - worry: None  . Food insecurity - inability: None  . Transportation needs - medical: None  . Transportation needs - non-medical: None  Occupational History  . Occupation: retired IT trainer  Tobacco Use  . Smoking status: Former Smoker    Last attempt to quit: 02/21/1991    Years since quitting: 26.9  . Smokeless tobacco: Never Used  Substance and Sexual Activity  . Alcohol use: No    Alcohol/week: 0.0 oz  . Drug use: No  . Sexual activity: Not Currently  Other Topics Concern  . None  Social History Narrative   Married with two children.  He is a Engineer, structural.       Family History:  The patient's family history includes Alzheimer's disease in his mother; Heart attack in his father; Heart disease in his father and mother; Heart failure in his father.   ROS:   Please see the history of present illness.    ROS All other systems reviewed and are negative.   PHYSICAL EXAM:   VS:  BP 102/64   Pulse 60   Ht 5\' 8"  (1.727 m)   Wt 214 lb 6.4 oz (97.3 kg)   SpO2 98%   BMI 32.60 kg/m    GEN: Well nourished, well developed, in no acute distress  HEENT: normal  Neck: no JVD, carotid bruits, or masses Cardiac: RRR; no murmurs, rubs, or gallops,no edema  Respiratory:  clear to auscultation bilaterally, normal work of breathing GI: soft,  nontender, nondistended, + BS MS: no deformity or atrophy  Skin: warm and dry, no rash Neuro:  Alert and Oriented x 3, Strength and sensation are intact Psych: euthymic mood, full affect  Wt Readings from Last 3 Encounters:  01/15/18 214 lb 6.4 oz (97.3 kg)  12/12/17 217 lb (98.4 kg)  09/24/17 219 lb (99.3 kg)      Studies/Labs Reviewed:   EKG:  EKG is not ordered today.    Recent Labs: 03/19/2017: B Natriuretic Peptide 224.5 07/26/2017: HGB 11.5; Platelets 87 09/24/2017: ALT 16 01/09/2018: BUN 18; Creatinine, Ser 1.18; Potassium 4.5; Sodium 144   Lipid Panel    Component Value Date/Time   CHOL 119 09/24/2017 0824   TRIG 94 09/24/2017 0824   HDL 42 09/24/2017 0824   CHOLHDL 2.8 09/24/2017 0824   CHOLHDL 4 12/01/2013 0843   VLDL 37.6 12/01/2013 0843   LDLCALC 58 09/24/2017 0824   LDLDIRECT 154.2 10/20/2013 0846    Additional studies/ records that were reviewed today include:   Echo 04/12/2017 LV EF: 55% -   60%  Study Conclusions  - Left ventricle: The cavity size was normal. There was mild   concentric hypertrophy. Systolic function was normal. The   estimated ejection fraction was in the range of 55% to 60%. Wall   motion was normal; there were no regional wall motion   abnormalities. - Aortic valve: Transvalvular velocity was within the normal range.   There was no stenosis. There was no regurgitation. - Mitral valve: Transvalvular velocity was within the normal range.   There was no evidence for stenosis. There was trivial   regurgitation. - Right ventricle: The cavity size was mildly dilated. Wall   thickness was normal. Systolic function was normal. - Tricuspid valve: There was moderate regurgitation. - Pulmonary arteries: Systolic pressure was moderately increased.   PA peak  pressure: 49 mm Hg (S).   Myoview 04/24/2017 Study Highlights    Nuclear stress EF: 59%. The left ventricular ejection fraction is normal (55-65%).  The study is normal. No  evidence of ischemia .  This is a low risk study.     ASSESSMENT:    1. Orthostatic hypotension   2. Encounter for medication adjustment   3. Coronary artery disease involving coronary bypass graft of native heart without angina pectoris   4. Essential hypertension   5. Hyperlipidemia, unspecified hyperlipidemia type   6. Controlled type 2 diabetes mellitus without complication, without long-term current use of insulin ([place])   7. PAF (paroxysmal atrial fibrillation) ([place])   8. Pacemaker   9. Pulmonary fibrosis ([place])      PLAN:  In order of problems listed above:  1. Orthostatic hypotension: See orthostatic vital signs above, I will discontinue lisinopril.  He will need a recheck of basic metabolic panel in 2 weeks.  Interestingly, he denies any dizziness, he attributed his symptoms to more balance issue.  2. Lower extremity edema: Improved, still has worsening swelling when he is standing on his feet for long period of time.  Likely related to venous insufficiency.  He has compression stocking.  3. Paroxysmal atrial fibrillation s/p MAZE procedure: Not a Coumadin candidate due to history of spontaneous subdural hematoma.  4. CAD s/p CABG: Myoview in May 2018 was normal.  Some baseline dyspnea on exertion, this has not changed recently.  5. Sinus nodal dysfunction s/p St Jude PPM: No obvious issues recently  6. Hypertension: Blood pressure well controlled.  7. Hyperlipidemia: Lipid panel obtained in October 2018 showed well-controlled cholesterol.  Continue Crestor 40 mg daily  8. DM 2: Monitor by primary care provider, currently not on any medication.    Medication Adjustments/Labs and Tests Ordered: Current medicines are reviewed at length with the patient today.  Concerns regarding medicines are outlined above.  Medication changes, Labs and Tests ordered today are listed in the Patient Instructions below. Patient Instructions  Medication Instructions:  DISCONTINUE  Lisinopril  Labwork: Your physician recommends that you return for lab work in: 2 weeks recheck BMP  Testing/Procedures: None   Follow-Up: Your physician recommends that you schedule a follow-up appointment in: 3-4 Months with Dr [name]  Any Other Special Instructions Will Be Listed Below (If Applicable). If you need a refill on your cardiac medications before your next appointment, please call your pharmacy.     Hilbert Corrigan, [place]  01/16/2018 9:59 AM    [address], [place], [place]  [address] Phone: [phone]; Fax: [phone]

## 2018-01-16 ENCOUNTER — Encounter: Payer: Self-pay | Admitting: Physician Assistant

## 2018-01-25 ENCOUNTER — Telehealth: Payer: Self-pay

## 2018-01-25 ENCOUNTER — Other Ambulatory Visit: Payer: Self-pay | Admitting: Hematology and Oncology

## 2018-01-25 DIAGNOSIS — D696 Thrombocytopenia, unspecified: Secondary | ICD-10-CM

## 2018-01-25 NOTE — Telephone Encounter (Signed)
Called patient to remind to have have repeat labs drawn, patient voiced understanding and stated he would be try to come by Monday 01/28/2018. Lab test already ordered.

## 2018-01-28 ENCOUNTER — Telehealth: Payer: Self-pay | Admitting: Hematology and Oncology

## 2018-01-28 ENCOUNTER — Encounter: Payer: Self-pay | Admitting: Hematology and Oncology

## 2018-01-28 ENCOUNTER — Inpatient Hospital Stay: Payer: Medicare Other

## 2018-01-28 ENCOUNTER — Inpatient Hospital Stay: Payer: Medicare Other | Attending: Hematology and Oncology | Admitting: Hematology and Oncology

## 2018-01-28 DIAGNOSIS — E669 Obesity, unspecified: Secondary | ICD-10-CM | POA: Insufficient documentation

## 2018-01-28 DIAGNOSIS — Z6835 Body mass index (BMI) 35.0-35.9, adult: Secondary | ICD-10-CM

## 2018-01-28 DIAGNOSIS — D638 Anemia in other chronic diseases classified elsewhere: Secondary | ICD-10-CM | POA: Insufficient documentation

## 2018-01-28 DIAGNOSIS — Z79899 Other long term (current) drug therapy: Secondary | ICD-10-CM | POA: Insufficient documentation

## 2018-01-28 DIAGNOSIS — D649 Anemia, unspecified: Secondary | ICD-10-CM | POA: Insufficient documentation

## 2018-01-28 DIAGNOSIS — K760 Fatty (change of) liver, not elsewhere classified: Secondary | ICD-10-CM | POA: Diagnosis not present

## 2018-01-28 DIAGNOSIS — D696 Thrombocytopenia, unspecified: Secondary | ICD-10-CM | POA: Diagnosis not present

## 2018-01-28 DIAGNOSIS — R161 Splenomegaly, not elsewhere classified: Secondary | ICD-10-CM | POA: Diagnosis not present

## 2018-01-28 LAB — CBC WITH DIFFERENTIAL/PLATELET
Basophils Absolute: 0 10*3/uL (ref 0.0–0.1)
Basophils Relative: 0 %
Eosinophils Absolute: 0.2 10*3/uL (ref 0.0–0.5)
Eosinophils Relative: 4 %
HCT: 35.4 % — ABNORMAL LOW (ref 38.4–49.9)
Hemoglobin: 11.5 g/dL — ABNORMAL LOW (ref 13.0–17.1)
Lymphocytes Relative: 17 %
Lymphs Abs: 1 10*3/uL (ref 0.9–3.3)
MCH: 28.9 pg (ref 27.2–33.4)
MCHC: 32.5 g/dL (ref 32.0–36.0)
MCV: 88.9 fL (ref 79.3–98.0)
Monocytes Absolute: 0.3 10*3/uL (ref 0.1–0.9)
Monocytes Relative: 5 %
Neutro Abs: 4.3 10*3/uL (ref 1.5–6.5)
Neutrophils Relative %: 74 %
Platelets: 94 10*3/uL — ABNORMAL LOW (ref 140–400)
RBC: 3.98 MIL/uL — ABNORMAL LOW (ref 4.20–5.82)
RDW: 15 % — ABNORMAL HIGH (ref 11.0–14.6)
WBC: 5.8 10*3/uL (ref 4.0–10.3)

## 2018-01-28 NOTE — Telephone Encounter (Signed)
Gave avs and calendar for august  °

## 2018-01-28 NOTE — Assessment & Plan Note (Signed)
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 Even though the CT imaging report from September 2017 did not disclose this, I reviewed the imaging study with the patient extensively which clearly showed mild liver enlargement and splenomegaly. Rarely, autoimmune disorder that could cause pulmonary fibrosis can also cause mild thrombocytopenia. He is not symptomatic.  Previous autoimmune screen, hepatitis C screening and serum vitamin B12 were within normal limits We discussed importance of weight loss as a way to preserve his liver function.   In my experience, I do see improvement of thrombocytopenia for patients with weight loss alone. For now, there is no excessive risk of bleeding as long as platelet count is greater than 50,000.

## 2018-01-28 NOTE — Assessment & Plan Note (Signed)
The patient is obese and likely have fatty liver disease He has significant central obesity We discussed weight loss strategies today and he appears motivated

## 2018-01-28 NOTE — Assessment & Plan Note (Signed)
This is likely anemia of chronic disease. The patient denies recent history of bleeding such as epistaxis, hematuria or hematochezia. He is asymptomatic from the anemia. We will observe for now.  

## 2018-01-28 NOTE — Progress Notes (Signed)
[place] OFFICE PROGRESS NOTE  [name], MD SUMMARY OF HEMATOLOGIC HISTORY:  Taygen Di Kindle. is here because of thrombocytopenia. 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  He was found to have abnormal CBC from abnormal blood work from routine blood work monitoring. I have reviewed outside records from his primary care doctor.  He is noted to have mild pancytopenia since 11/17/2015. On 11/17/2015, white blood cell count 5.9, hemoglobin 12.7 and platelet count 106 On 03/13/2016, white blood cell count 6.6, hemoglobin 12.6 and platelet count 103 On 09/06/2015, white blood cell count 6.6, hemoglobin 11.9 and platelet count 102 On 10/25/2016, white count 5.2, hemoglobin 12.7 and platelet count 86 On 11/08/16, white blood cell count 5.3, hemoglobin 12.2 implant, 107. On 09/05/2016, serum ferritin was high at 220, iron study showed serum iron low at 24 and 9% iron saturation.  Some older scanned records dated back to 12/13/2011 showed low platelet count of 101 On 12/21/2011, he had normal platelet count of 210 On 07/30/2012, his platelet count was borderline low at 143  On 09/08/2016, [name], such as spontaneous epistaxis, hematuria, [name]; however, based on his prior extensive surgical history, he probably had transfusion support in the past Overall impression is fatty liver disease secondary to poorly controlled  diabetes and morbid obesity as a cause of his thrombocytopenia.  He is being observed  INTERVAL HISTORY: Misael Di Kindle. 77 y.o. male returns for follow-up with his wife. He feels well. He denies recent infection. The patient denies any recent signs or symptoms of bleeding such as spontaneous epistaxis, hematuria or hematochezia. His energy level is fair.  I have reviewed the past medical history, past surgical history, social history and family history with the patient and they are unchanged from previous note.  ALLERGIES:  is allergic to allopurinol and enbrel [etanercept].  MEDICATIONS:  Current Outpatient Medications  Medication Sig Dispense Refill  . allopurinol (ZYLOPRIM) 300 MG tablet Take 1 tablet by mouth daily.    . colchicine 0.6 MG tablet Take 1.2 mg by mouth then 0.6 mg one hour later as needed for gout flares/as directed  1  . furosemide (LASIX) 40 MG tablet Take 1 tablet (40 mg total) by mouth 2 (two) times daily. 60 tablet 3  . metoprolol tartrate (LOPRESSOR) 50 MG tablet TAKE 1 TABLET TWICE DAILY 180 tablet 1  . omega-3 acid ethyl esters (LOVAZA) 1 G capsule Take 2 g by mouth 2 (two) times daily.     [name] oxybutynin (DITROPAN-XL) 10 MG 24 hr tablet Take 10 mg by mouth daily.     . pantoprazole (PROTONIX) 40 MG tablet Take 40 mg by mouth daily.    . potassium chloride SA (K-DUR,KLOR-CON) 20 MEQ tablet Take 20 mEq by mouth daily.      . rosuvastatin (CRESTOR) 40 MG tablet TAKE 1 TABLET (40 MG TOTAL) BY MOUTH DAILY. 30 tablet 8  . sertraline (ZOLOFT) 100 MG tablet Take 100 mg by mouth  daily.    . Tamsulosin HCl (FLOMAX) 0.4 MG CAPS Take 0.4 mg by mouth at bedtime.      No current facility-administered medications for this visit.      REVIEW OF SYSTEMS:   Constitutional: Denies fevers, chills or night sweats Eyes: Denies blurriness of vision Ears, nose, mouth, throat, and face: Denies mucositis or sore throat Respiratory: Denies cough, dyspnea or  wheezes Cardiovascular: Denies palpitation, chest discomfort or lower extremity swelling Gastrointestinal:  Denies nausea, heartburn or change in bowel habits Skin: Denies abnormal skin rashes Lymphatics: Denies new lymphadenopathy or easy bruising Neurological:Denies numbness, tingling or new weaknesses Behavioral/Psych: Mood is stable, no new changes  All other systems were reviewed with the patient and are negative.  PHYSICAL EXAMINATION: ECOG PERFORMANCE STATUS: 0 - Asymptomatic  Vitals:   01/28/18 1045  BP: 126/69  Pulse: 63  Resp: 18  Temp: 97.8 F (36.6 C)  SpO2: 100%   Filed Weights   01/28/18 1045  Weight: 212 lb 1.6 oz (96.2 kg)    GENERAL:alert, no distress and comfortable SKIN: skin color, texture, turgor are normal, no rashes or significant lesions EYES: normal, Conjunctiva are pink and non-injected, sclera clear OROPHARYNX:no exudate, no erythema and lips, buccal mucosa, and tongue normal  NECK: supple, thyroid normal size, non-tender, without nodularity LYMPH:  no palpable lymphadenopathy in the cervical, axillary or inguinal LUNGS: clear to auscultation and percussion with normal breathing effort HEART: regular rate & rhythm and no murmurs and no lower extremity edema ABDOMEN:abdomen soft, non-tender and normal bowel sounds Musculoskeletal:no cyanosis of digits and no clubbing  NEURO: alert & oriented x 3 with fluent speech, no focal motor/sensory deficits  LABORATORY DATA:  I have reviewed the data as listed     Component Value Date/Time   NA 144 01/09/2018 1457   NA 141 02/23/2017 1237   K 4.5 01/09/2018 1457   K 3.8 02/23/2017 1237   CL 105 01/09/2018 1457   CO2 24 01/09/2018 1457   CO2 27 02/23/2017 1237   GLUCOSE 132 (H) 01/09/2018 1457   GLUCOSE 105 (H) 03/19/2017 1759   GLUCOSE 113 02/23/2017 1237   BUN 18 01/09/2018 1457   BUN 18.3 02/23/2017 1237   CREATININE 1.18 01/09/2018 1457   CREATININE 1.2 02/23/2017 1237   CALCIUM 9.5  01/09/2018 1457   CALCIUM 9.6 02/23/2017 1237   PROT 7.1 09/24/2017 0824   PROT 8.0 02/23/2017 1237   ALBUMIN 4.1 09/24/2017 0824   ALBUMIN 4.1 02/23/2017 1237   AST 25 09/24/2017 0824   AST 24 02/23/2017 1237   ALT 16 09/24/2017 0824   ALT 15 02/23/2017 1237   ALKPHOS 91 09/24/2017 0824   ALKPHOS 105 02/23/2017 1237   BILITOT 0.6 09/24/2017 0824   BILITOT 0.98 02/23/2017 1237   GFRNONAA 60 01/09/2018 1457   GFRAA 69 01/09/2018 1457    No results found for: SPEP, UPEP  Lab Results  Component Value Date   WBC 5.8 01/28/2018   NEUTROABS 4.3 01/28/2018   HGB 11.5 (L) 01/28/2018   HCT 35.4 (L) 01/28/2018   MCV 88.9 01/28/2018   PLT 94 (L) 01/28/2018      Chemistry      Component Value Date/Time   NA 144 01/09/2018 1457   NA 141 02/23/2017 1237   K 4.5 01/09/2018 1457   K 3.8 02/23/2017 1237   CL 105 01/09/2018 1457   CO2 24 01/09/2018 1457   CO2 27 02/23/2017 1237   BUN 18 01/09/2018 1457  BUN 18.3 02/23/2017 1237   CREATININE 1.18 01/09/2018 1457   CREATININE 1.2 02/23/2017 1237      Component Value Date/Time   CALCIUM 9.5 01/09/2018 1457   CALCIUM 9.6 02/23/2017 1237   ALKPHOS 91 09/24/2017 0824   ALKPHOS 105 02/23/2017 1237   AST 25 09/24/2017 0824   AST 24 02/23/2017 1237   ALT 16 09/24/2017 0824   ALT 15 02/23/2017 1237   BILITOT 0.6 09/24/2017 0824   BILITOT 0.98 02/23/2017 1237      ASSESSMENT & PLAN:  Thrombocytopenia ([place]) The most likely cause of his chronic thrombocytopenia is 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 Even though the CT imaging report from September 2017 did not disclose this, I reviewed the imaging study with the patient extensively which clearly showed mild liver enlargement and splenomegaly. Rarely, autoimmune disorder that could cause pulmonary fibrosis can also cause mild thrombocytopenia. He is not symptomatic.  Previous autoimmune screen, hepatitis C screening and serum vitamin B12 were within normal  limits We discussed importance of weight loss as a way to preserve his liver function.   In my experience, I do see improvement of thrombocytopenia for patients with weight loss alone. For now, there is no excessive risk of bleeding as long as platelet count is greater than 50,000.  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 The patient denies recent history of bleeding such as epistaxis, hematuria or hematochezia. He is asymptomatic from the anemia. We will observe for now.    No orders of the defined types were placed in this encounter.   All questions were answered. The patient knows to call the clinic with any problems, questions or concerns. No barriers to learning was detected.  I spent 10 minutes counseling the patient face to face. The total time spent in the appointment was 15 minutes and more than 50% was on counseling.     [name], MD 2/11/201911:39 AM

## 2018-01-29 DIAGNOSIS — I951 Orthostatic hypotension: Secondary | ICD-10-CM | POA: Diagnosis not present

## 2018-01-29 DIAGNOSIS — Z5189 Encounter for other specified aftercare: Secondary | ICD-10-CM | POA: Diagnosis not present

## 2018-01-30 DIAGNOSIS — L4050 Arthropathic psoriasis, unspecified: Secondary | ICD-10-CM | POA: Diagnosis not present

## 2018-01-30 DIAGNOSIS — M109 Gout, unspecified: Secondary | ICD-10-CM | POA: Diagnosis not present

## 2018-01-30 DIAGNOSIS — L409 Psoriasis, unspecified: Secondary | ICD-10-CM | POA: Diagnosis not present

## 2018-01-30 DIAGNOSIS — M1990 Unspecified osteoarthritis, unspecified site: Secondary | ICD-10-CM | POA: Diagnosis not present

## 2018-01-30 DIAGNOSIS — D696 Thrombocytopenia, unspecified: Secondary | ICD-10-CM | POA: Diagnosis not present

## 2018-01-30 DIAGNOSIS — R21 Rash and other nonspecific skin eruption: Secondary | ICD-10-CM | POA: Diagnosis not present

## 2018-01-30 DIAGNOSIS — Z79899 Other long term (current) drug therapy: Secondary | ICD-10-CM | POA: Diagnosis not present

## 2018-01-30 DIAGNOSIS — N189 Chronic kidney disease, unspecified: Secondary | ICD-10-CM | POA: Diagnosis not present

## 2018-01-30 DIAGNOSIS — D649 Anemia, unspecified: Secondary | ICD-10-CM | POA: Diagnosis not present

## 2018-01-30 LAB — BASIC METABOLIC PANEL
BUN/Creatinine Ratio: 13 (ref 10–24)
BUN: 15 mg/dL (ref 8–27)
CO2: 26 mmol/L (ref 20–29)
Calcium: 8.9 mg/dL (ref 8.6–10.2)
Chloride: 101 mmol/L (ref 96–106)
Creatinine, Ser: 1.18 mg/dL (ref 0.76–1.27)
GFR calc Af Amer: 69 mL/min/{1.73_m2} (ref >59)
GFR calc non Af Amer: 60 mL/min/{1.73_m2} (ref >59)
Glucose: 114 mg/dL — ABNORMAL HIGH (ref 65–99)
Potassium: 4.1 mmol/L (ref 3.5–5.2)
Sodium: 141 mmol/L (ref 134–144)

## 2018-02-05 DIAGNOSIS — I8311 Varicose veins of right lower extremity with inflammation: Secondary | ICD-10-CM | POA: Diagnosis not present

## 2018-02-05 DIAGNOSIS — D485 Neoplasm of uncertain behavior of skin: Secondary | ICD-10-CM | POA: Diagnosis not present

## 2018-02-05 DIAGNOSIS — D225 Melanocytic nevi of trunk: Secondary | ICD-10-CM | POA: Diagnosis not present

## 2018-02-05 DIAGNOSIS — I872 Venous insufficiency (chronic) (peripheral): Secondary | ICD-10-CM | POA: Diagnosis not present

## 2018-02-05 DIAGNOSIS — L570 Actinic keratosis: Secondary | ICD-10-CM | POA: Diagnosis not present

## 2018-02-05 DIAGNOSIS — I8312 Varicose veins of left lower extremity with inflammation: Secondary | ICD-10-CM | POA: Diagnosis not present

## 2018-02-05 DIAGNOSIS — L820 Inflamed seborrheic keratosis: Secondary | ICD-10-CM | POA: Diagnosis not present

## 2018-02-25 DIAGNOSIS — N401 Enlarged prostate with lower urinary tract symptoms: Secondary | ICD-10-CM | POA: Diagnosis not present

## 2018-02-25 DIAGNOSIS — R3915 Urgency of urination: Secondary | ICD-10-CM | POA: Diagnosis not present

## 2018-04-10 ENCOUNTER — Encounter: Payer: Self-pay | Admitting: Cardiology

## 2018-04-17 NOTE — Progress Notes (Signed)
HPI: FU CAD and atrial fibrillation. He underwent CABG (LIMA-LAD, SVG-OM 2, SVG-PDA) along with modified Cox Maze IV procedure in 12/2007. 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 Abdominal US 3/12: No aneurysm. 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 He underwent craniotomy and evacuation by Dr. Saintclair Halsted. He was taken off of Coumadin and no longer felt to be an anticoagulation candidate. Multaq DCed previously as felt causing pulmonary toxixity. Nuclear study 5/18 showed EF 59 with normal perfusion.   Echocardiogram December 2018 showed normal LV function, mild left ventricular hypertrophy, mild left atrial enlargement, mild to moderate tricuspid regurgitation and mild pulmonary hypertension.  Carotid Dopplers January 2019 showed 1 to 39% bilateral stenosis.  There was greater than 50% right external carotid artery stenosis.  Since last seen,  patient has some dyspnea on exertion unchanged.  He denies orthopnea, PND, pedal edema, chest pain or syncope.  He describes fatigue and bilateral lower extremity weakness but no claudication.  Current Outpatient Medications  Medication Sig Dispense Refill  . allopurinol (ZYLOPRIM) 300 MG tablet Take 1 tablet by mouth daily.    . colchicine 0.6 MG tablet Take 1.2 mg by mouth then 0.6 mg one hour later as needed for gout flares/as directed  1  . furosemide (LASIX) 40 MG tablet Take 1 tablet (40 mg total) by mouth 2 (two) times daily. 60 tablet 3  . metoprolol tartrate (LOPRESSOR) 50 MG tablet TAKE 1 TABLET TWICE DAILY 180 tablet 1  . omega-3 acid ethyl esters (LOVAZA) 1 G capsule Take 2 g by mouth 2 (two) times daily.     [name] oxybutynin (DITROPAN-XL) 10 MG 24 hr tablet Take 10 mg by mouth daily.     . pantoprazole (PROTONIX) 40 MG tablet Take 40 mg by mouth daily.    . potassium chloride SA (K-DUR,KLOR-CON) 20 MEQ tablet Take 20 mEq by mouth daily.      . rosuvastatin (CRESTOR) 40 MG  tablet TAKE 1 TABLET (40 MG TOTAL) BY MOUTH DAILY. 30 tablet 8  . sertraline (ZOLOFT) 100 MG tablet Take 100 mg by mouth daily.    . Tamsulosin HCl (FLOMAX) 0.4 MG CAPS Take 0.4 mg by mouth at bedtime.      No current facility-administered medications for this visit.      Past Medical History:  Diagnosis Date  . (HFpEF) heart failure with preserved ejection fraction ([place])    a. 05/2013 Echo: EF 55%, mild LVH, diast dysfxn, Ao sclerosis, mildly dil LA, RV dysfxn (poorly visualized), PASP 4mmHg;  b. 03/2017 Echo: EF 55-60%, no rwma, triv MR, mildly dil RV, mod TR, PASP 67mmHg.  . Atrial fibrillation [facility])    s/p Cox Maze 1/09;  Multaq Rx d/c'd in 2014 due to pulmo fibrosis;  coumadin d/c'd in 2014 due to spontaneous subdural hematoma  . BPH (benign prostatic hyperplasia)   . CAD (coronary artery disease), native coronary artery    a. s/p CABG 12/2007;  b. Myoview 12/2011: EF 66%, no scar or ischemia; normal.  . DM (diabetes mellitus) ([place])   . Hyperlipidemia type II   . Hypertension   . OSA (obstructive sleep apnea)   . Pacemaker    PPM - St. Jude  . Pulmonary fibrosis ([place])    Multaq d/c'd 7/14  . Subdural hematoma ([place]) 07/2012   spontaneous;  coumadin d/c'd => no longer a candidate for anticoagulation    Past Surgical History:  Procedure Laterality Date  . APPENDECTOMY    .  CHOLECYSTECTOMY    . CORONARY ARTERY BYPASS GRAFT     x3 (left internal mammary artery to distal left anterior descending coronary artery, saphenous vain graft to second circumflex marginal branch, saphenous vain graft to posterior descending coronary artery, endoscopic saphenous vain harvest from right thigh) and modified Cox - Maze IV procedure.  [name],MD. Electronically signed CHO/MEDQ D: 01/09/2008/ JOB: [id number] cc:  [name]  . [name]  07/30/2012   Procedure: CRANIOTOMY HEMATOMA EVACUATION SUBDURAL;  Surgeon: [name], MD;  Location: [facility];  Service: Neurosurgery;   Laterality: Right;  Right craniotomy for evacuation of subdural hematoma  . FOOT SURGERY    . HERNIA REPAIR    . ORCHIECTOMY     Left  /  testicular cancer  . PACEMAKER PLACEMENT     PPM - St. Jude    Social History   Socioeconomic History  . Marital status: Married    Spouse name: [name]  . Number of children: 2  . Years of education: Not on file  . Highest education level: Not on file  Occupational History  . Occupation: retired Marketing executive  . Financial resource strain: Not on file  . Food insecurity:    Worry: Not on file    Inability: Not on file  . Transportation needs:    Medical: Not on file    Non-medical: Not on file  Tobacco Use  . Smoking status: Former Smoker    Last attempt to quit: 02/21/1991    Years since quitting: 27.1  . Smokeless tobacco: Never Used  Substance and Sexual Activity  . Alcohol use: No    Alcohol/week: 0.0 oz  . Drug use: No  . Sexual activity: Not Currently  Lifestyle  . Physical activity:    Days per week: Not on file    Minutes per session: Not on file  . Stress: Not on file  Relationships  . Social connections:    Talks on phone: Not on file    Gets together: Not on file    Attends religious service: Not on file    Active member of club or organization: Not on file    Attends meetings of clubs or organizations: Not on file    Relationship status: Not on file  . Intimate partner violence:    Fear of current or ex partner: Not on file    Emotionally abused: Not on file    Physically abused: Not on file    Forced sexual activity: Not on file  Other Topics Concern  . Not on file  Social History Narrative   Married with two children.  He is a Engineer, structural.      Family History  Problem Relation Age of Onset  . Heart disease Father   . Heart attack Father   . Heart failure Father   . Heart disease Mother   . Alzheimer's disease Mother     ROS: no fevers or chills, productive cough, hemoptysis, dysphasia,  odynophagia, melena, hematochezia, dysuria, hematuria, rash, seizure activity, orthopnea, PND, pedal edema, claudication. Remaining systems are negative.  Physical Exam: Well-developed obese in no acute distress.  Skin is warm and dry.  HEENT is normal.  Neck is supple.  Chest is clear to auscultation with normal expansion.  Cardiovascular exam is regular rate and rhythm.  Abdominal exam nontender or distended. No masses palpated. Extremities show trace edema. neuro grossly intact  ECG-probable sinus rhythm though P waves difficult  to discern.  Regular.  Right bundle branch block, nonspecific ST changes.  Personally reviewed  A/P  1 paroxysmal atrial fibrillation-patient is in sinus rhythm today.  We have not anticoagulated given history of spontaneous intracranial hemorrhage.  We will continue with beta-blockade for rate control if atrial fibrillation recurs.  Multaq discontinued previously as it was felt to be potentially contributing to pulmonary fibrosis.  2 coronary artery disease-patient denies chest pain.  Continue medical therapy with statin.  Patient is not on aspirin given history of spontaneous intracranial hemorrhage.  3 hypertension-blood pressure is controlled.  Continue present medications.  4 hyperlipidemia-continue statin.  5 chronic diastolic congestive heart failure-symptoms are stable at present.  Continue present dose of Lasix.  Check potassium and renal function.  Continue low-sodium diet and fluid restriction.  6 prior pacemaker-managed by electrophysiology.   7 fatigue/leg weakness-patient is not describing claudication.  Check hemoglobin and TSH.  Follow-up primary care.  Kirk Ruths, MD

## 2018-04-23 ENCOUNTER — Encounter: Payer: Self-pay | Admitting: Cardiology

## 2018-04-23 ENCOUNTER — Ambulatory Visit (INDEPENDENT_AMBULATORY_CARE_PROVIDER_SITE_OTHER): Payer: Medicare Other | Admitting: Cardiology

## 2018-04-23 VITALS — BP 128/68 | HR 98 | Ht 68.0 in | Wt 216.0 lb

## 2018-04-23 DIAGNOSIS — I25810 Atherosclerosis of coronary artery bypass graft(s) without angina pectoris: Secondary | ICD-10-CM

## 2018-04-23 DIAGNOSIS — E7800 Pure hypercholesterolemia, unspecified: Secondary | ICD-10-CM | POA: Diagnosis not present

## 2018-04-23 DIAGNOSIS — I480 Paroxysmal atrial fibrillation: Secondary | ICD-10-CM

## 2018-04-23 DIAGNOSIS — I10 Essential (primary) hypertension: Secondary | ICD-10-CM

## 2018-04-23 NOTE — Patient Instructions (Signed)

## 2018-04-24 ENCOUNTER — Encounter: Payer: Self-pay | Admitting: *Deleted

## 2018-04-24 LAB — BASIC METABOLIC PANEL
BUN/Creatinine Ratio: 17 (ref 10–24)
BUN: 20 mg/dL (ref 8–27)
CO2: 26 mmol/L (ref 20–29)
Calcium: 9.3 mg/dL (ref 8.6–10.2)
Chloride: 102 mmol/L (ref 96–106)
Creatinine, Ser: 1.16 mg/dL (ref 0.76–1.27)
GFR calc Af Amer: 71 mL/min/{1.73_m2} (ref >59)
GFR calc non Af Amer: 61 mL/min/{1.73_m2} (ref >59)
Glucose: 103 mg/dL — ABNORMAL HIGH (ref 65–99)
Potassium: 4.5 mmol/L (ref 3.5–5.2)
Sodium: 143 mmol/L (ref 134–144)

## 2018-04-24 LAB — TSH: TSH: 3.29 u[IU]/mL (ref 0.450–4.500)

## 2018-04-24 LAB — CBC
Hematocrit: 36.1 % — ABNORMAL LOW (ref 37.5–51.0)
Hemoglobin: 11.5 g/dL — ABNORMAL LOW (ref 13.0–17.7)
MCH: 28.5 pg (ref 26.6–33.0)
MCHC: 31.9 g/dL (ref 31.5–35.7)
MCV: 89 fL (ref 79–97)
Platelets: 103 10*3/uL — ABNORMAL LOW (ref 150–379)
RBC: 4.04 x10E6/uL — ABNORMAL LOW (ref 4.14–5.80)
RDW: 17.4 % — ABNORMAL HIGH (ref 12.3–15.4)
WBC: 4.7 10*3/uL (ref 3.4–10.8)

## 2018-05-27 ENCOUNTER — Other Ambulatory Visit: Payer: Self-pay | Admitting: Cardiology

## 2018-06-07 ENCOUNTER — Other Ambulatory Visit: Payer: Self-pay | Admitting: Cardiology

## 2018-06-11 DIAGNOSIS — E119 Type 2 diabetes mellitus without complications: Secondary | ICD-10-CM | POA: Diagnosis not present

## 2018-06-11 DIAGNOSIS — Z125 Encounter for screening for malignant neoplasm of prostate: Secondary | ICD-10-CM | POA: Diagnosis not present

## 2018-06-11 DIAGNOSIS — R319 Hematuria, unspecified: Secondary | ICD-10-CM | POA: Diagnosis not present

## 2018-06-11 DIAGNOSIS — E7800 Pure hypercholesterolemia, unspecified: Secondary | ICD-10-CM | POA: Diagnosis not present

## 2018-06-11 DIAGNOSIS — N390 Urinary tract infection, site not specified: Secondary | ICD-10-CM | POA: Diagnosis not present

## 2018-06-13 DIAGNOSIS — Z0000 Encounter for general adult medical examination without abnormal findings: Secondary | ICD-10-CM | POA: Diagnosis not present

## 2018-06-13 DIAGNOSIS — I6529 Occlusion and stenosis of unspecified carotid artery: Secondary | ICD-10-CM | POA: Diagnosis not present

## 2018-06-13 DIAGNOSIS — E1121 Type 2 diabetes mellitus with diabetic nephropathy: Secondary | ICD-10-CM | POA: Diagnosis not present

## 2018-06-13 DIAGNOSIS — I2510 Atherosclerotic heart disease of native coronary artery without angina pectoris: Secondary | ICD-10-CM | POA: Diagnosis not present

## 2018-06-13 DIAGNOSIS — L0291 Cutaneous abscess, unspecified: Secondary | ICD-10-CM | POA: Diagnosis not present

## 2018-06-13 DIAGNOSIS — G4733 Obstructive sleep apnea (adult) (pediatric): Secondary | ICD-10-CM | POA: Diagnosis not present

## 2018-06-13 DIAGNOSIS — R413 Other amnesia: Secondary | ICD-10-CM | POA: Diagnosis not present

## 2018-06-13 DIAGNOSIS — R269 Unspecified abnormalities of gait and mobility: Secondary | ICD-10-CM | POA: Diagnosis not present

## 2018-06-13 DIAGNOSIS — I10 Essential (primary) hypertension: Secondary | ICD-10-CM | POA: Diagnosis not present

## 2018-06-18 ENCOUNTER — Other Ambulatory Visit: Payer: Self-pay

## 2018-06-18 ENCOUNTER — Telehealth: Payer: Self-pay | Admitting: Neurology

## 2018-06-18 ENCOUNTER — Encounter: Payer: Self-pay | Admitting: Neurology

## 2018-06-18 ENCOUNTER — Ambulatory Visit (INDEPENDENT_AMBULATORY_CARE_PROVIDER_SITE_OTHER): Payer: Medicare Other | Admitting: Neurology

## 2018-06-18 VITALS — BP 110/70 | HR 60 | Ht 68.0 in | Wt 217.0 lb

## 2018-06-18 DIAGNOSIS — R413 Other amnesia: Secondary | ICD-10-CM

## 2018-06-18 DIAGNOSIS — G603 Idiopathic progressive neuropathy: Secondary | ICD-10-CM | POA: Diagnosis not present

## 2018-06-18 DIAGNOSIS — R269 Unspecified abnormalities of gait and mobility: Secondary | ICD-10-CM | POA: Diagnosis not present

## 2018-06-18 DIAGNOSIS — I25810 Atherosclerosis of coronary artery bypass graft(s) without angina pectoris: Secondary | ICD-10-CM | POA: Diagnosis not present

## 2018-06-18 DIAGNOSIS — E538 Deficiency of other specified B group vitamins: Secondary | ICD-10-CM

## 2018-06-18 NOTE — Telephone Encounter (Signed)
Medicare/aarp order sent to GI. No auth they will reach out to the pt to schedule.  °

## 2018-06-18 NOTE — Progress Notes (Signed)
Reason for visit: Memory disturbance, gait disturbance  Referring physician: Dr. Elvera Bicker. is a 77 y.o. male  History of present illness:  Mr. Collard is a 77 year old white male with a history of obesity, borderline diabetes, coronary artery disease, sleep apnea on CPAP, atrial fibrillation, and a prior subdural hematoma on the right requiring surgery in 2014.  The patient reports a 2-year history of some problems with short-term memory.  He has difficulty with memory of names for people, and some short-term memory issues.  The patient is able to keep up with his own medications and appointments.  He operates a Teacher, music, there has been some issues with directions at times.  The patient uses his CPAP at night but he still has some daytime drowsiness, he will fall asleep if he is inactive.  The patient may talk in his sleep and act out his dreams to some degree according to the wife.  The patient has not given up any activities of daily living because of memory problems.  His mother did have some memory troubles that she got older.  The patient also reports troubles with leg weakness over the last several months, he has difficulty walking up an incline.  He has fallen on occasion.  He does report some numbness in the feet.  He denies issues controlling the bowels, he does have some urinary urgency at times, he is on oxybutynin for this.  The patient has swelling in the legs.  He comes to this office for an evaluation.  Past Medical History:  Diagnosis Date  . (HFpEF) heart failure with preserved ejection fraction ([place])    a. 05/2013 Echo: EF 55%, mild LVH, diast dysfxn, Ao sclerosis, mildly dil LA, RV dysfxn (poorly visualized), PASP 57mmHg;  b. 03/2017 Echo: EF 55-60%, no rwma, triv MR, mildly dil RV, mod TR, PASP 60mmHg.  . Atrial fibrillation [facility])    s/p Cox Maze 1/09;  Multaq Rx d/c'd in 2014 due to pulmo fibrosis;  coumadin d/c'd in 2014 due to spontaneous subdural  hematoma  . BPH (benign prostatic hyperplasia)   . CAD (coronary artery disease), native coronary artery    a. s/p CABG 12/2007;  b. Myoview 12/2011: EF 66%, no scar or ischemia; normal.  . DM (diabetes mellitus) ([place])   . Hyperlipidemia type II   . Hypertension   . OSA (obstructive sleep apnea)   . Pacemaker    PPM - St. Jude  . Pulmonary fibrosis ([place])    Multaq d/c'd 7/14  . Subdural hematoma ([place]) 07/2012   spontaneous;  coumadin d/c'd => no longer a candidate for anticoagulation    Past Surgical History:  Procedure Laterality Date  . APPENDECTOMY    . CHOLECYSTECTOMY    . CORONARY ARTERY BYPASS GRAFT     x3 (left internal mammary artery to distal left anterior descending coronary artery, saphenous vain graft to second circumflex marginal branch, saphenous vain graft to posterior descending coronary artery, endoscopic saphenous vain harvest from right thigh) and modified Cox - Maze IV procedure.  [name],MD. Electronically signed CHO/MEDQ D: 01/09/2008/ JOB: [id number] cc:  [name]  . [name]  07/30/2012   Procedure: CRANIOTOMY HEMATOMA EVACUATION SUBDURAL;  Surgeon: [name], MD;  Location: [place];  Service: Neurosurgery;  Laterality: Right;  Right craniotomy for evacuation of subdural hematoma  . FOOT SURGERY    . HERNIA REPAIR    . ORCHIECTOMY  Left  /  testicular cancer  . PACEMAKER PLACEMENT     PPM - St. Jude    Family History  Problem Relation Age of Onset  . Heart disease Father   . Heart attack Father   . Heart failure Father   . Heart disease Mother   . Alzheimer's disease Mother   . Dementia Mother     Social history:  reports that he quit smoking about 27 years ago. He has never used smokeless tobacco. He reports that he does not drink alcohol or use drugs.  Medications:  Prior to Admission medications   Medication Sig Start Date End Date Taking? Authorizing Provider  allopurinol (ZYLOPRIM) 300 MG tablet Take 1 tablet by mouth  daily. 06/21/16  Yes [provider]  colchicine 0.6 MG tablet Take 1.2 mg by mouth then 0.6 mg one hour later as needed for gout flares/as directed 05/13/15  Yes [provider]  furosemide (LASIX) 40 MG tablet Take 1 tablet (40 mg total) by mouth 2 (two) times daily. 12/12/17  Yes [name], PA  metoprolol tartrate (LOPRESSOR) 50 MG tablet TAKE 1 TABLET TWICE DAILY 06/07/18  Yes Crenshaw, [name], MD  omega-3 acid ethyl esters (LOVAZA) 1 G capsule Take 2 g by mouth 2 (two) times daily.    Yes [provider]  oxybutynin (DITROPAN-XL) 10 MG 24 hr tablet Take 10 mg by mouth daily.  04/07/13  Yes [provider]  pantoprazole (PROTONIX) 40 MG tablet Take 40 mg by mouth daily. 02/22/17  Yes [provider]  potassium chloride SA (K-DUR,KLOR-CON) 20 MEQ tablet Take 20 mEq by mouth daily.     Yes [provider]  rosuvastatin (CRESTOR) 40 MG tablet TAKE 1 TABLET (40 MG TOTAL) BY MOUTH DAILY. 05/27/18  Yes [name], MD  sertraline (ZOLOFT) 100 MG tablet Take 100 mg by mouth daily.   Yes [provider]  Tamsulosin HCl (FLOMAX) 0.4 MG CAPS Take 0.4 mg by mouth at bedtime.    Yes [provider]      Allergies  Allergen Reactions  . Allopurinol Itching    May or may not be allergic to this (If the patient should find out he is NOT allergic, please omit this entry.)  . Enbrel [Etanercept] Itching    May or may not be allergic to this (If the patient should find out he is NOT allergic, please omit this entry.)    ROS:  Out of a complete 14 system review of symptoms, the patient complains only of the following symptoms, and all other reviewed systems are negative.  Chills, fatigue Hearing loss Light sensitivity Shortness of breath Leg swelling Excessive thirst Swollen abdomen Sleep apnea, daytime sleepiness, sleep talking Incontinence of the bladder, frequency of urination, urinary urgency Joint pain Itching Bruising  easily  Blood pressure 110/70, pulse 60, height 5\' 8"  (1.727 m), weight 217 lb (98.4 kg), SpO2 97 %.  Physical Exam  General: The patient is alert and cooperative at the time of the examination.  The patient is markedly obese.  Eyes: Pupils are equal, round, and reactive to light. Discs are flat bilaterally.  Neck: The neck is supple, no carotid bruits are noted.  Respiratory: The respiratory examination is clear.  Cardiovascular: The cardiovascular examination reveals a regular rate and rhythm, no obvious murmurs or rubs are noted.  Skin: Extremities are with 2+ edema below the knees bilaterally.  Neurologic Exam  Mental status: The patient is alert and oriented  x 3 at the time of the examination. The patient has apparent normal recent and remote memory, with an apparently normal attention span and concentration ability.  Mini-Mental status examination done today shows a total score 30/30.  Cranial nerves: Facial symmetry is present. There is good sensation of the face to pinprick and soft touch bilaterally. The strength of the facial muscles and the muscles to head turning and shoulder shrug are normal bilaterally. Speech is well enunciated, no aphasia or dysarthria is noted. Extraocular movements are full. Visual fields are full. The tongue is midline, and the patient has symmetric elevation of the soft palate. No obvious hearing deficits are noted.  Motor: The motor testing reveals 5 over 5 strength of all 4 extremities, with exception of bilateral foot drops and 4/5 strength with hip flexion bilaterally. Good symmetric motor tone is noted throughout.  Sensory: Sensory testing is intact to pinprick, soft touch, vibration sensation, and position sense on the upper extremities.  With the lower extremities there is a stocking pattern pinprick sensory deficit across the knees bilaterally with minimal impairment of vibration sensation in both feet.  Position sensation is depressed in the  left foot, to a mild degree on the right foot.  No evidence of extinction is noted.  Coordination: Cerebellar testing reveals good finger-nose-finger and heel-to-shin bilaterally.  Gait and station: Gait is wide-based, slightly unsteady.  Tandem gait is unsteady.  Romberg is negative but is unsteady.  Reflexes: Deep tendon reflexes are symmetric, but are depressed bilaterally. Toes are downgoing bilaterally.   Assessment/Plan:  1.  Mild memory disturbance  2.  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  The patient reports that he has borderline diabetes.  The patient also has proximal muscle weakness of the legs, he denies any significant back pain or pain down the legs.  The patient will be set up for blood work today, he will have nerve conduction studies of both legs and EMG on one leg.  The patient is on oxybutynin for his bladder which has strong anticholinergic effects which may go against the memory.  I would recommend discontinuing this medication and using another medication such as Myrbetriq for the bladder.  The patient will undergo a CT scan of the brain, he cannot have MRI as he has a cardiac pacemaker placement.  He has multiple risk factors for cerebrovascular disease.  He will return to this office in 5 or 6 months.  Jill Alexanders MD 06/18/2018 12:04 PM  [facility] [address] [place], [place] [address]  Phone [phone] Fax [phone]

## 2018-06-18 NOTE — Patient Instructions (Signed)
We will get CT of the head and an EMG and NCV study to look at the nerve and muscle function of the legs.

## 2018-06-19 DIAGNOSIS — L0291 Cutaneous abscess, unspecified: Secondary | ICD-10-CM | POA: Diagnosis not present

## 2018-06-20 LAB — ANGIOTENSIN CONVERTING ENZYME: Angio Convert Enzyme: 35 U/L (ref 14–82)

## 2018-06-20 LAB — RPR: RPR Ser Ql: NONREACTIVE

## 2018-06-20 LAB — SEDIMENTATION RATE: Sed Rate: 18 mm/hr (ref 0–30)

## 2018-06-20 LAB — MULTIPLE MYELOMA PANEL, SERUM
Albumin SerPl Elph-Mcnc: 3.8 g/dL (ref 2.9–4.4)
Albumin/Glob SerPl: 1.1 (ref 0.7–1.7)
Alpha 1: 0.3 g/dL (ref 0.0–0.4)
Alpha2 Glob SerPl Elph-Mcnc: 0.7 g/dL (ref 0.4–1.0)
B-Globulin SerPl Elph-Mcnc: 1.3 g/dL (ref 0.7–1.3)
Gamma Glob SerPl Elph-Mcnc: 1.3 g/dL (ref 0.4–1.8)
Globulin, Total: 3.5 g/dL (ref 2.2–3.9)
IgA/Immunoglobulin A, Serum: 623 mg/dL — ABNORMAL HIGH (ref 61–437)
IgG (Immunoglobin G), Serum: 1304 mg/dL (ref 700–1600)
IgM (Immunoglobulin M), Srm: 93 mg/dL (ref 15–143)
M Protein SerPl Elph-Mcnc: 0.3 g/dL — ABNORMAL HIGH
Total Protein: 7.3 g/dL (ref 6.0–8.5)

## 2018-06-20 LAB — B. BURGDORFI ANTIBODIES: Lyme IgG/IgM Ab: <0.91 {ISR} (ref 0.00–0.90)

## 2018-06-20 LAB — CK: Total CK: 43 U/L (ref 24–204)

## 2018-06-20 LAB — VITAMIN B12: Vitamin B-12: 648 pg/mL (ref 232–1245)

## 2018-06-20 LAB — ANA W/REFLEX: Anti Nuclear Antibody(ANA): NEGATIVE

## 2018-06-21 ENCOUNTER — Telehealth: Payer: Self-pay | Admitting: Neurology

## 2018-06-21 NOTE — Telephone Encounter (Signed)
    I called the patient.  The blood work was unremarkable except that he has an IgA monoclonal antibody with kappa chain, we may consider a hematology/oncology consultation in the future, the patient will follow-up for the EMG evaluation.

## 2018-06-26 ENCOUNTER — Telehealth: Payer: Self-pay | Admitting: Hematology and Oncology

## 2018-06-26 NOTE — Telephone Encounter (Signed)
Called patient regarding date change

## 2018-06-27 DIAGNOSIS — R3915 Urgency of urination: Secondary | ICD-10-CM | POA: Diagnosis not present

## 2018-07-11 ENCOUNTER — Ambulatory Visit
Admission: RE | Admit: 2018-07-11 | Discharge: 2018-07-11 | Disposition: A | Discharge status: Home / Self Care | Payer: Medicare Other | Source: Ambulatory Visit | Attending: Neurology | Admitting: Neurology

## 2018-07-11 DIAGNOSIS — R413 Other amnesia: Secondary | ICD-10-CM

## 2018-07-12 ENCOUNTER — Telehealth: Payer: Self-pay | Admitting: Neurology

## 2018-07-12 NOTE — Telephone Encounter (Signed)
I called the patient.  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  Nothing that already impair balance or significant problems with memory.  The patient will follow-up for EMG and nerve conduction study on 14 August.   CT head 07/12/18:  IMPRESSION: This CT scan of the head without contrast shows the following: 1.    Brain volume is normal for age and essentially unchanged compared to 2014. 2.    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 3.    Prior right craniotomy 4.    No acute findings.

## 2018-07-15 DIAGNOSIS — M25562 Pain in left knee: Secondary | ICD-10-CM | POA: Diagnosis not present

## 2018-07-15 DIAGNOSIS — M7989 Other specified soft tissue disorders: Secondary | ICD-10-CM | POA: Diagnosis not present

## 2018-07-15 DIAGNOSIS — M25552 Pain in left hip: Secondary | ICD-10-CM | POA: Diagnosis not present

## 2018-07-15 DIAGNOSIS — M79605 Pain in left leg: Secondary | ICD-10-CM | POA: Diagnosis not present

## 2018-07-24 DIAGNOSIS — T161XXA Foreign body in right ear, initial encounter: Secondary | ICD-10-CM | POA: Diagnosis not present

## 2018-07-25 DIAGNOSIS — H353121 Nonexudative age-related macular degeneration, left eye, early dry stage: Secondary | ICD-10-CM | POA: Diagnosis not present

## 2018-07-25 DIAGNOSIS — H353111 Nonexudative age-related macular degeneration, right eye, early dry stage: Secondary | ICD-10-CM | POA: Diagnosis not present

## 2018-07-25 DIAGNOSIS — Z961 Presence of intraocular lens: Secondary | ICD-10-CM | POA: Diagnosis not present

## 2018-07-25 DIAGNOSIS — H43811 Vitreous degeneration, right eye: Secondary | ICD-10-CM | POA: Diagnosis not present

## 2018-07-29 ENCOUNTER — Other Ambulatory Visit: Payer: Medicare Other

## 2018-07-29 ENCOUNTER — Ambulatory Visit: Payer: Medicare Other | Admitting: Hematology and Oncology

## 2018-07-30 DIAGNOSIS — M19072 Primary osteoarthritis, left ankle and foot: Secondary | ICD-10-CM | POA: Diagnosis not present

## 2018-07-30 DIAGNOSIS — M1611 Unilateral primary osteoarthritis, right hip: Secondary | ICD-10-CM | POA: Diagnosis not present

## 2018-07-30 DIAGNOSIS — N189 Chronic kidney disease, unspecified: Secondary | ICD-10-CM | POA: Diagnosis not present

## 2018-07-30 DIAGNOSIS — D696 Thrombocytopenia, unspecified: Secondary | ICD-10-CM | POA: Diagnosis not present

## 2018-07-30 DIAGNOSIS — R296 Repeated falls: Secondary | ICD-10-CM | POA: Diagnosis not present

## 2018-07-30 DIAGNOSIS — M79671 Pain in right foot: Secondary | ICD-10-CM | POA: Diagnosis not present

## 2018-07-30 DIAGNOSIS — M25569 Pain in unspecified knee: Secondary | ICD-10-CM | POA: Diagnosis not present

## 2018-07-30 DIAGNOSIS — S99921A Unspecified injury of right foot, initial encounter: Secondary | ICD-10-CM | POA: Diagnosis not present

## 2018-07-30 DIAGNOSIS — M79672 Pain in left foot: Secondary | ICD-10-CM | POA: Diagnosis not present

## 2018-07-30 DIAGNOSIS — L409 Psoriasis, unspecified: Secondary | ICD-10-CM | POA: Diagnosis not present

## 2018-07-30 DIAGNOSIS — M25562 Pain in left knee: Secondary | ICD-10-CM | POA: Diagnosis not present

## 2018-07-30 DIAGNOSIS — R21 Rash and other nonspecific skin eruption: Secondary | ICD-10-CM | POA: Diagnosis not present

## 2018-07-30 DIAGNOSIS — M109 Gout, unspecified: Secondary | ICD-10-CM | POA: Diagnosis not present

## 2018-07-30 DIAGNOSIS — M25561 Pain in right knee: Secondary | ICD-10-CM | POA: Diagnosis not present

## 2018-07-30 DIAGNOSIS — Z79899 Other long term (current) drug therapy: Secondary | ICD-10-CM | POA: Diagnosis not present

## 2018-07-30 DIAGNOSIS — D649 Anemia, unspecified: Secondary | ICD-10-CM | POA: Diagnosis not present

## 2018-07-30 DIAGNOSIS — M1990 Unspecified osteoarthritis, unspecified site: Secondary | ICD-10-CM | POA: Diagnosis not present

## 2018-07-30 DIAGNOSIS — M549 Dorsalgia, unspecified: Secondary | ICD-10-CM | POA: Diagnosis not present

## 2018-07-30 DIAGNOSIS — L4050 Arthropathic psoriasis, unspecified: Secondary | ICD-10-CM | POA: Diagnosis not present

## 2018-07-30 DIAGNOSIS — M25551 Pain in right hip: Secondary | ICD-10-CM | POA: Diagnosis not present

## 2018-07-31 ENCOUNTER — Encounter: Payer: Self-pay | Admitting: Neurology

## 2018-07-31 ENCOUNTER — Ambulatory Visit (INDEPENDENT_AMBULATORY_CARE_PROVIDER_SITE_OTHER): Payer: Medicare Other | Admitting: Neurology

## 2018-07-31 DIAGNOSIS — G603 Idiopathic progressive neuropathy: Secondary | ICD-10-CM | POA: Diagnosis not present

## 2018-07-31 DIAGNOSIS — R269 Unspecified abnormalities of gait and mobility: Secondary | ICD-10-CM

## 2018-07-31 DIAGNOSIS — R413 Other amnesia: Secondary | ICD-10-CM

## 2018-07-31 DIAGNOSIS — G629 Polyneuropathy, unspecified: Secondary | ICD-10-CM

## 2018-07-31 DIAGNOSIS — D472 Monoclonal gammopathy: Secondary | ICD-10-CM

## 2018-07-31 HISTORY — DX: Polyneuropathy, unspecified: G62.9

## 2018-07-31 HISTORY — DX: Monoclonal gammopathy: D47.2

## 2018-07-31 NOTE — Progress Notes (Signed)
Please refer to EMG nerve conduction study procedure note. 

## 2018-07-31 NOTE — Progress Notes (Addendum)
EMG nerve conduction study done today does show evidence of a significant peripheral neuropathy.  The patient has an IgA monoclonal antibody, he is already followed through hematology/oncology.  I would question whether any further work-up needs to be done for the monoclonal antibody.   [place]    Nerve / Sites Muscle Latency Ref. Amplitude Ref. Rel Amp Segments Distance Velocity Ref. Area    ms ms mV mV %  cm m/s m/s mVms  R Ulnar - ADM     Wrist ADM 3.1 ?3.3 9.1 ?6.0 100 Wrist - ADM 7   30.6     B.Elbow ADM 7.6  9.1  100 B.Elbow - Wrist 20 45 ?49 29.3     A.Elbow ADM 9.7  8.8  97.6 A.Elbow - B.Elbow 10 46 ?49 29.0         A.Elbow - Wrist      R Peroneal - EDB     Ankle EDB NR ?6.5 NR ?2.0 NR Ankle - EDB 9   NR     Fib head EDB NR  NR  NR Fib head - Ankle   ?44 NR  L Peroneal - EDB     Ankle EDB 6.0 ?6.5 0.7 ?2.0 100 Ankle - EDB 9   2.0     Fib head EDB 15.1  0.5  79.7 Fib head - Ankle 33 36 ?44 1.5     Pop fossa EDB 17.7  0.4  79.8 Pop fossa - Fib head 10 38 ?44 1.1         Pop fossa - Ankle      R Tibial - AH     Ankle AH 6.0 ?5.8 1.9 ?4.0 100 Ankle - AH 9   3.0     Pop fossa AH 18.3  0.8  43.1 Pop fossa - Ankle 39 32 ?41 2.3  L Tibial - AH     Ankle AH 5.9 ?5.8 0.4 ?4.0 100 Ankle - AH 9   0.7     Pop fossa AH NR  NR  NR Pop fossa - Ankle 39 NR ?41 NR               SNC    Nerve / Sites Rec. Site Peak Lat Ref.  Amp Ref. Segments Distance    ms ms V V  cm  R Sural - Ankle (Calf)     Calf Ankle NR ?4.4 NR ?6 Calf - Ankle 14  L Sural - Ankle (Calf)     Calf Ankle NR ?4.4 NR ?6 Calf - Ankle 14  R Superficial peroneal - Ankle     Lat leg Ankle NR ?4.4 NR ?6 Lat leg - Ankle 14  L Superficial peroneal - Ankle     Lat leg Ankle NR ?4.4 NR ?6 Lat leg - Ankle 14  R Ulnar - Orthodromic, (Dig V, Mid palm)     Dig V Wrist 3.2 ?3.1 3 ?5 Dig V - Wrist 61               F  Wave    Nerve F Lat Ref.   ms ms  R Tibial - AH 67.3 ?56.0  L Tibial - AH 69.2 ?56.0  R Ulnar - ADM 34.4 ?32.0

## 2018-07-31 NOTE — Procedures (Signed)
     HISTORY:  [name] is a 77 year old gentleman with a history of gait instability, episodic falls.  He has mild bilateral foot drop issues, he is being evaluated for a possible peripheral neuropathy.  He has an IgA monoclonal antibody.  NERVE CONDUCTION STUDIES:  Nerve conduction studies were performed on the right upper extremity.  The distal motor latency and motor amplitude for the right ulnar nerve is normal but slowing was seen above and below the elbow for this nerve.  The right ulnar sensory latency is slightly prolonged.  The F-wave latency for the right ulnar nerve is prolonged.  Nerve conduction studies performed on both lower extremities.  No response was seen for the right peroneal nerve.  The distal motor latency for the left peroneal nerve is normal with a low motor amplitude.  The distal motor latencies for the posterior tibial nerves were prolonged bilaterally with low motor amplitudes for these nerves bilaterally.  Slowing was seen for the left peroneal nerve and right posterior tibial nerve.  Stimulation of the left posterior tibial nerve at the popliteal fossa resulted in no response.  The sensory latencies for the sural and peroneal nerves were unobtainable bilaterally.  The F-wave latencies for the posterior tibial nerves were prolonged bilaterally.  EMG STUDIES:  EMG study was performed on the right lower extremity:  The tibialis anterior muscle reveals 2 to 4K motor units with decreased recruitment. No fibrillations or positive waves were seen. The peroneus tertius muscle reveals 2 to 4K motor units with decreased recruitment. No fibrillations or positive waves were seen.  Polyphasic motor units were seen. The medial gastrocnemius muscle reveals 1 to 3K motor units with decreased recruitment. No fibrillations or positive waves were seen. The vastus lateralis muscle reveals 2 to 4K motor units with full recruitment. No fibrillations or positive waves were seen. The  iliopsoas muscle reveals 2 to 4K motor units with full recruitment. No fibrillations or positive waves were seen. The biceps femoris muscle (long head) reveals 2 to 4K motor units with full recruitment. No fibrillations or positive waves were seen. The lumbosacral paraspinal muscles were tested at 3 levels, and revealed no abnormalities of insertional activity at all 3 levels tested. There was good relaxation.   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  The EMG evaluation of the right lower extremity showed mild chronic stable distal signs of denervation consistent with the diagnosis of peripheral neuropathy.  There is no evidence of an overlying lumbosacral radiculopathy.  Jill Alexanders MD 07/31/2018 3:19 PM  [facility] [address] [place],  [address]  Phone [phone] Fax [phone]

## 2018-08-05 ENCOUNTER — Other Ambulatory Visit: Payer: Self-pay | Admitting: Hematology and Oncology

## 2018-08-05 DIAGNOSIS — D472 Monoclonal gammopathy: Secondary | ICD-10-CM

## 2018-08-05 DIAGNOSIS — D696 Thrombocytopenia, unspecified: Secondary | ICD-10-CM

## 2018-08-07 DIAGNOSIS — M6281 Muscle weakness (generalized): Secondary | ICD-10-CM | POA: Diagnosis not present

## 2018-08-07 DIAGNOSIS — R296 Repeated falls: Secondary | ICD-10-CM | POA: Diagnosis not present

## 2018-08-07 DIAGNOSIS — R260 Ataxic gait: Secondary | ICD-10-CM | POA: Diagnosis not present

## 2018-08-12 ENCOUNTER — Inpatient Hospital Stay: Payer: Medicare Other | Attending: Hematology and Oncology

## 2018-08-12 ENCOUNTER — Telehealth: Payer: Self-pay | Admitting: Hematology and Oncology

## 2018-08-12 ENCOUNTER — Inpatient Hospital Stay (HOSPITAL_BASED_OUTPATIENT_CLINIC_OR_DEPARTMENT_OTHER): Payer: Medicare Other | Admitting: Hematology and Oncology

## 2018-08-12 VITALS — BP 113/66 | HR 62 | Temp 97.7°F | Resp 18 | Ht 68.0 in | Wt 210.4 lb

## 2018-08-12 DIAGNOSIS — K760 Fatty (change of) liver, not elsewhere classified: Secondary | ICD-10-CM

## 2018-08-12 DIAGNOSIS — R296 Repeated falls: Secondary | ICD-10-CM | POA: Diagnosis not present

## 2018-08-12 DIAGNOSIS — G629 Polyneuropathy, unspecified: Secondary | ICD-10-CM | POA: Insufficient documentation

## 2018-08-12 DIAGNOSIS — D472 Monoclonal gammopathy: Secondary | ICD-10-CM | POA: Diagnosis not present

## 2018-08-12 DIAGNOSIS — D61818 Other pancytopenia: Secondary | ICD-10-CM | POA: Insufficient documentation

## 2018-08-12 DIAGNOSIS — Z923 Personal history of irradiation: Secondary | ICD-10-CM | POA: Insufficient documentation

## 2018-08-12 DIAGNOSIS — Z8547 Personal history of malignant neoplasm of testis: Secondary | ICD-10-CM | POA: Diagnosis not present

## 2018-08-12 DIAGNOSIS — R350 Frequency of micturition: Secondary | ICD-10-CM | POA: Diagnosis not present

## 2018-08-12 DIAGNOSIS — N401 Enlarged prostate with lower urinary tract symptoms: Secondary | ICD-10-CM | POA: Diagnosis not present

## 2018-08-12 DIAGNOSIS — D696 Thrombocytopenia, unspecified: Secondary | ICD-10-CM

## 2018-08-12 DIAGNOSIS — R3915 Urgency of urination: Secondary | ICD-10-CM | POA: Diagnosis not present

## 2018-08-12 LAB — COMPREHENSIVE METABOLIC PANEL
ALT: 16 U/L (ref 0–44)
AST: 25 U/L (ref 15–41)
Albumin: 3.8 g/dL (ref 3.5–5.0)
Alkaline Phosphatase: 101 U/L (ref 38–126)
Anion gap: 7 (ref 5–15)
BUN: 16 mg/dL (ref 8–23)
CO2: 30 mmol/L (ref 22–32)
Calcium: 9.5 mg/dL (ref 8.9–10.3)
Chloride: 105 mmol/L (ref 98–111)
Creatinine, Ser: 1.31 mg/dL — ABNORMAL HIGH (ref 0.61–1.24)
GFR calc Af Amer: 60 mL/min — ABNORMAL LOW (ref >60)
GFR calc non Af Amer: 52 mL/min — ABNORMAL LOW (ref >60)
Glucose, Bld: 146 mg/dL — ABNORMAL HIGH (ref 70–99)
Potassium: 3.3 mmol/L — ABNORMAL LOW (ref 3.5–5.1)
Sodium: 142 mmol/L (ref 135–145)
Total Bilirubin: 0.7 mg/dL (ref 0.3–1.2)
Total Protein: 8 g/dL (ref 6.5–8.1)

## 2018-08-12 LAB — CBC WITH DIFFERENTIAL/PLATELET
Basophils Absolute: 0 10*3/uL (ref 0.0–0.1)
Basophils Relative: 1 %
Eosinophils Absolute: 0.1 10*3/uL (ref 0.0–0.5)
Eosinophils Relative: 3 %
HCT: 31.3 % — ABNORMAL LOW (ref 38.4–49.9)
Hemoglobin: 10.1 g/dL — ABNORMAL LOW (ref 13.0–17.1)
Lymphocytes Relative: 16 %
Lymphs Abs: 0.6 10*3/uL — ABNORMAL LOW (ref 0.9–3.3)
MCH: 28.6 pg (ref 27.2–33.4)
MCHC: 32.2 g/dL (ref 32.0–36.0)
MCV: 88.8 fL (ref 79.3–98.0)
Monocytes Absolute: 0.1 10*3/uL (ref 0.1–0.9)
Monocytes Relative: 4 %
Neutro Abs: 2.8 10*3/uL (ref 1.5–6.5)
Neutrophils Relative %: 76 %
Platelets: 84 10*3/uL — ABNORMAL LOW (ref 140–400)
RBC: 3.53 MIL/uL — ABNORMAL LOW (ref 4.20–5.82)
RDW: 19.1 % — ABNORMAL HIGH (ref 11.0–14.6)
WBC: 3.6 10*3/uL — ABNORMAL LOW (ref 4.0–10.3)

## 2018-08-12 NOTE — Telephone Encounter (Signed)
Gave patient avs and calendar.  Referral for CT, gave patient contrast and info.

## 2018-08-13 ENCOUNTER — Encounter: Payer: Self-pay | Admitting: Hematology and Oncology

## 2018-08-13 DIAGNOSIS — R260 Ataxic gait: Secondary | ICD-10-CM | POA: Diagnosis not present

## 2018-08-13 DIAGNOSIS — M6281 Muscle weakness (generalized): Secondary | ICD-10-CM | POA: Diagnosis not present

## 2018-08-13 DIAGNOSIS — R296 Repeated falls: Secondary | ICD-10-CM | POA: Insufficient documentation

## 2018-08-13 DIAGNOSIS — D61818 Other pancytopenia: Secondary | ICD-10-CM | POA: Insufficient documentation

## 2018-08-13 LAB — KAPPA/LAMBDA LIGHT CHAINS
Kappa free light chain: 66.6 mg/L — ABNORMAL HIGH (ref 3.3–19.4)
Kappa, lambda light chain ratio: 2.21 — ABNORMAL HIGH (ref 0.26–1.65)
Lambda free light chains: 30.1 mg/L — ABNORMAL HIGH (ref 5.7–26.3)

## 2018-08-13 NOTE — Assessment & Plan Note (Signed)
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

## 2018-08-13 NOTE — Assessment & Plan Note (Signed)
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

## 2018-08-13 NOTE — Assessment & Plan Note (Signed)
The patient have recurrent falls He had been referred to physical therapy for assessment and evaluation Certainly, severe peripheral neuropathy can contribute to sensory changes that might predispose him to recurrent falls Recent CT imaging was negative for intracranial lesions.

## 2018-08-13 NOTE — Progress Notes (Signed)
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[facility]) The patient is noted to have progressive pancytopenia Many different causes could be possible including malignancy versus sequestration from splenomegaly For now, I recommend we order CT scan of the abdomen and pelvis to exclude cancer recurrence before proceeding with bone marrow aspirate and biopsy He agree with the plan of care  History of primary testicular cancer The patient have history of testicular cancer status post surgery and radiation therapy He had been complaining of nausea on a regular basis I recommend repeat CT abdomen and pelvis to exclude cancer recurrence and he agreed  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   Orders Placed This Encounter  Procedures  . CT ABDOMEN PELVIS W CONTRAST    Standing Status:   Future    Standing Expiration Date:   08/13/2019    Order Specific Question:   If indicated for the ordered procedure, I authorize the administration of contrast media per Radiology protocol    Answer:   Yes    Order Specific Question:   Preferred imaging  location?    Answer:   [facility]    Order Specific Question:   Radiology Contrast Protocol - do NOT remove file path    Answer:   [email or web address]    INTERVAL HISTORY: Please see below for problem oriented charting. 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 is here because of thrombocytopenia. 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  He was found to have abnormal CBC from abnormal blood work from routine blood work monitoring. I have reviewed outside records from his primary care doctor.  He is noted to have mild pancytopenia since 11/17/2015. On 11/17/2015, white blood cell count 5.9, hemoglobin 12.7 and platelet count 106 On 03/13/2016, white blood cell count 6.6, hemoglobin 12.6 and platelet count 103 On 09/06/2015, white blood cell count 6.6, hemoglobin 11.9 and platelet count 102 On 10/25/2016, white count 5.2, hemoglobin 12.7 and platelet count 86 On  11/08/16, white blood cell count 5.3, hemoglobin 12.2 implant, 107. On 09/05/2016, serum ferritin was high at 220, iron study showed serum iron low at 24 and 9% iron  saturation.  Some older scanned records dated back to 12/13/2011 showed low platelet count of 101 On 12/21/2011, he had normal platelet count of 210 On 07/30/2012, his platelet count was borderline low at 143  On 09/08/2016, [name], such as spontaneous epistaxis, hematuria, 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  He is being observed   REVIEW OF SYSTEMS:   Constitutional: Denies fevers, chills or abnormal weight loss Eyes: Denies blurriness of vision Ears, nose, mouth, throat, and face: Denies mucositis or sore throat Respiratory: Denies cough, dyspnea or wheezes Cardiovascular: Denies palpitation, chest discomfort or lower extremity swelling Skin: Denies abnormal skin rashes Lymphatics: Denies new lymphadenopathy  Behavioral/Psych: Mood is stable, no new changes  All other systems were reviewed with the patient and are negative.  I have reviewed the past medical history, past surgical history, social history and family history with the patient and they are unchanged from previous note.  ALLERGIES:  is allergic to allopurinol and enbrel [etanercept].  MEDICATIONS:  Current Outpatient Medications  Medication Sig Dispense Refill  . allopurinol (ZYLOPRIM) 300 MG tablet Take 1 tablet by mouth daily.    . colchicine 0.6 MG tablet Take  1.2 mg by mouth then 0.6 mg one hour later as needed for gout flares/as directed  1  . furosemide (LASIX) 40 MG tablet Take 1 tablet (40 mg total) by mouth 2 (two) times daily. 60 tablet 3  . metoprolol tartrate (LOPRESSOR) 50 MG tablet TAKE 1 TABLET TWICE DAILY 180 tablet 3  . omega-3 acid ethyl esters (LOVAZA) 1 G capsule Take 2 g by mouth 2 (two) times daily.     [name] oxybutynin (DITROPAN-XL) 10 MG 24 hr tablet Take 10 mg by mouth daily.     . pantoprazole (PROTONIX) 40 MG tablet Take 40 mg by mouth daily.    . potassium chloride SA (K-DUR,KLOR-CON) 20 MEQ tablet Take 20 mEq by mouth daily.      . rosuvastatin (CRESTOR) 40 MG tablet TAKE 1 TABLET (40 MG TOTAL) BY MOUTH DAILY. 30 tablet 8  . sertraline (ZOLOFT) 100 MG tablet Take 100 mg by mouth daily.    . Tamsulosin HCl (FLOMAX) 0.4 MG CAPS Take 0.4 mg by mouth at bedtime.      No current facility-administered medications for this visit.     PHYSICAL EXAMINATION: ECOG PERFORMANCE STATUS: 2 - Symptomatic, <50% confined to bed  Vitals:   08/12/18 1214  BP: 113/66  Pulse: 62  Resp: 18  Temp: 97.7 F (36.5 C)  SpO2: 99%   Filed Weights   08/12/18 1214  Weight: 210 lb 6.4 oz (95.4 kg)    GENERAL:alert, no distress and comfortable SKIN: Noted excessive skin bruises EYES: normal, Conjunctiva  are pink and non-injected, sclera clear OROPHARYNX:no exudate, no erythema and lips, buccal mucosa, and tongue normal  NECK: supple, thyroid normal size, non-tender, without nodularity LYMPH:  no palpable lymphadenopathy in the cervical, axillary or inguinal LUNGS: clear to auscultation and percussion with normal breathing effort HEART: regular rate & rhythm and no murmurs and no lower extremity edema ABDOMEN:abdomen soft, non-tender and normal bowel sounds.  Limited exam due to central obesity Musculoskeletal:no cyanosis of digits and no clubbing  NEURO: alert & oriented x 3 with fluent speech, no focal motor/sensory deficits  LABORATORY  DATA:  I have reviewed the data as listed    Component Value Date/Time   NA 142 08/12/2018 1152   NA 143 04/23/2018 0952   NA 141 02/23/2017 1237   K 3.3 (L) 08/12/2018 1152   K 3.8 02/23/2017 1237   CL 105 08/12/2018 1152   CO2 30 08/12/2018 1152   CO2 27 02/23/2017 1237   GLUCOSE 146 (H) 08/12/2018 1152   GLUCOSE 113 02/23/2017 1237   BUN 16 08/12/2018 1152   BUN 20 04/23/2018 0952   BUN 18.3 02/23/2017 1237   CREATININE 1.31 (H) 08/12/2018 1152   CREATININE 1.2 02/23/2017 1237   CALCIUM 9.5 08/12/2018 1152   CALCIUM 9.6 02/23/2017 1237   PROT 8.0 08/12/2018 1152   PROT 7.3 06/18/2018 1205   PROT 8.0 02/23/2017 1237   ALBUMIN 3.8 08/12/2018 1152   ALBUMIN 4.1 09/24/2017 0824   ALBUMIN 4.1 02/23/2017 1237   AST 25 08/12/2018 1152   AST 24 02/23/2017 1237   ALT 16 08/12/2018 1152   ALT 15 02/23/2017 1237   ALKPHOS 101 08/12/2018 1152   ALKPHOS 105 02/23/2017 1237   BILITOT 0.7 08/12/2018 1152   BILITOT 0.6 09/24/2017 0824   BILITOT 0.98 02/23/2017 1237   GFRNONAA 52 (L) 08/12/2018 1152   GFRAA 60 (L) 08/12/2018 1152    No results found for: SPEP, UPEP  Lab Results  Component Value Date   WBC 3.6 (L) 08/12/2018   NEUTROABS 2.8 08/12/2018   HGB 10.1 (L) 08/12/2018   HCT 31.3 (L) 08/12/2018   MCV 88.8 08/12/2018   PLT 84 (L) 08/12/2018      Chemistry      Component Value Date/Time   NA 142 08/12/2018 1152   NA 143 04/23/2018 0952   NA 141 02/23/2017 1237   K 3.3 (L) 08/12/2018 1152   K 3.8 02/23/2017 1237   CL 105 08/12/2018 1152   CO2 30 08/12/2018 1152   CO2 27 02/23/2017 1237   BUN 16 08/12/2018 1152   BUN 20 04/23/2018 0952   BUN 18.3 02/23/2017 1237   CREATININE 1.31 (H) 08/12/2018 1152   CREATININE 1.2 02/23/2017 1237      Component Value Date/Time   CALCIUM 9.5 08/12/2018 1152   CALCIUM 9.6 02/23/2017 1237   ALKPHOS 101 08/12/2018 1152   ALKPHOS 105 02/23/2017 1237   AST 25 08/12/2018 1152   AST 24 02/23/2017 1237   ALT 16 08/12/2018  1152   ALT 15 02/23/2017 1237   BILITOT 0.7 08/12/2018 1152   BILITOT 0.6 09/24/2017 0824   BILITOT 0.98 02/23/2017 1237      All questions were answered. The patient knows to call the clinic with any problems, questions or concerns. No barriers to learning was detected.  I spent 25 minutes counseling the patient face to face. The total time spent in the appointment was 30 minutes and more than 50% was on counseling and review of  test results  [name], MD 08/13/2018 8:11 AM

## 2018-08-13 NOTE — Assessment & Plan Note (Signed)
The patient is noted to have progressive pancytopenia Many different causes could be possible including malignancy versus sequestration from splenomegaly For now, I recommend we order CT scan of the abdomen and pelvis to exclude cancer recurrence before proceeding with bone marrow aspirate and biopsy He agree with the plan of care

## 2018-08-15 ENCOUNTER — Encounter (HOSPITAL_COMMUNITY): Payer: Self-pay

## 2018-08-15 ENCOUNTER — Ambulatory Visit (HOSPITAL_COMMUNITY)
Admission: RE | Admit: 2018-08-15 | Discharge: 2018-08-15 | Disposition: A | Discharge status: Home / Self Care | Payer: Medicare Other | Source: Ambulatory Visit | Attending: Hematology and Oncology | Admitting: Hematology and Oncology

## 2018-08-15 DIAGNOSIS — Z8547 Personal history of malignant neoplasm of testis: Secondary | ICD-10-CM | POA: Insufficient documentation

## 2018-08-15 DIAGNOSIS — K760 Fatty (change of) liver, not elsewhere classified: Secondary | ICD-10-CM | POA: Diagnosis not present

## 2018-08-15 DIAGNOSIS — Z9889 Other specified postprocedural states: Secondary | ICD-10-CM | POA: Insufficient documentation

## 2018-08-15 DIAGNOSIS — R161 Splenomegaly, not elsewhere classified: Secondary | ICD-10-CM | POA: Diagnosis not present

## 2018-08-15 DIAGNOSIS — Z9079 Acquired absence of other genital organ(s): Secondary | ICD-10-CM | POA: Insufficient documentation

## 2018-08-15 DIAGNOSIS — R188 Other ascites: Secondary | ICD-10-CM | POA: Insufficient documentation

## 2018-08-15 DIAGNOSIS — R296 Repeated falls: Secondary | ICD-10-CM | POA: Diagnosis not present

## 2018-08-15 DIAGNOSIS — Z9049 Acquired absence of other specified parts of digestive tract: Secondary | ICD-10-CM | POA: Diagnosis not present

## 2018-08-15 DIAGNOSIS — M6281 Muscle weakness (generalized): Secondary | ICD-10-CM | POA: Diagnosis not present

## 2018-08-15 DIAGNOSIS — K7460 Unspecified cirrhosis of liver: Secondary | ICD-10-CM | POA: Diagnosis not present

## 2018-08-15 DIAGNOSIS — R260 Ataxic gait: Secondary | ICD-10-CM | POA: Diagnosis not present

## 2018-08-15 MED ORDER — IOHEXOL 300 MG/ML  SOLN
100.0000 mL | Freq: Once | INTRAMUSCULAR | Status: AC | PRN
  Administered 2018-08-15: 100 mL via INTRAVENOUS

## 2018-08-16 DIAGNOSIS — M109 Gout, unspecified: Secondary | ICD-10-CM | POA: Diagnosis not present

## 2018-08-16 DIAGNOSIS — D649 Anemia, unspecified: Secondary | ICD-10-CM | POA: Diagnosis not present

## 2018-08-16 DIAGNOSIS — M1990 Unspecified osteoarthritis, unspecified site: Secondary | ICD-10-CM | POA: Diagnosis not present

## 2018-08-16 DIAGNOSIS — L4050 Arthropathic psoriasis, unspecified: Secondary | ICD-10-CM | POA: Diagnosis not present

## 2018-08-16 DIAGNOSIS — Z79899 Other long term (current) drug therapy: Secondary | ICD-10-CM | POA: Diagnosis not present

## 2018-08-16 DIAGNOSIS — N189 Chronic kidney disease, unspecified: Secondary | ICD-10-CM | POA: Diagnosis not present

## 2018-08-16 DIAGNOSIS — L409 Psoriasis, unspecified: Secondary | ICD-10-CM | POA: Diagnosis not present

## 2018-08-16 DIAGNOSIS — D696 Thrombocytopenia, unspecified: Secondary | ICD-10-CM | POA: Diagnosis not present

## 2018-08-20 ENCOUNTER — Telehealth: Payer: Self-pay | Admitting: Hematology and Oncology

## 2018-08-20 ENCOUNTER — Other Ambulatory Visit: Payer: Self-pay

## 2018-08-20 ENCOUNTER — Inpatient Hospital Stay: Payer: Medicare Other | Attending: Hematology and Oncology | Admitting: Hematology and Oncology

## 2018-08-20 ENCOUNTER — Telehealth: Payer: Self-pay | Admitting: Cardiology

## 2018-08-20 DIAGNOSIS — Z8547 Personal history of malignant neoplasm of testis: Secondary | ICD-10-CM | POA: Diagnosis not present

## 2018-08-20 DIAGNOSIS — R296 Repeated falls: Secondary | ICD-10-CM | POA: Diagnosis not present

## 2018-08-20 DIAGNOSIS — R161 Splenomegaly, not elsewhere classified: Secondary | ICD-10-CM | POA: Insufficient documentation

## 2018-08-20 DIAGNOSIS — Z79899 Other long term (current) drug therapy: Secondary | ICD-10-CM

## 2018-08-20 DIAGNOSIS — D61818 Other pancytopenia: Secondary | ICD-10-CM

## 2018-08-20 DIAGNOSIS — D472 Monoclonal gammopathy: Secondary | ICD-10-CM | POA: Diagnosis not present

## 2018-08-20 DIAGNOSIS — Z923 Personal history of irradiation: Secondary | ICD-10-CM | POA: Insufficient documentation

## 2018-08-20 DIAGNOSIS — K7460 Unspecified cirrhosis of liver: Secondary | ICD-10-CM | POA: Diagnosis not present

## 2018-08-20 DIAGNOSIS — M6281 Muscle weakness (generalized): Secondary | ICD-10-CM | POA: Diagnosis not present

## 2018-08-20 DIAGNOSIS — K760 Fatty (change of) liver, not elsewhere classified: Secondary | ICD-10-CM

## 2018-08-20 DIAGNOSIS — R260 Ataxic gait: Secondary | ICD-10-CM | POA: Diagnosis not present

## 2018-08-20 MED ORDER — ROSUVASTATIN CALCIUM 40 MG PO TABS: 40.0000 mg | ORAL_TABLET | Freq: Every day | ORAL | 2 refills | Status: DC

## 2018-08-20 MED ORDER — ROSUVASTATIN CALCIUM 40 MG PO TABS: 40.0000 mg | ORAL_TABLET | Freq: Every day | ORAL | 0 refills | Status: DC

## 2018-08-20 NOTE — Telephone Encounter (Signed)
Gave patient avs and calendar.   °

## 2018-08-20 NOTE — Telephone Encounter (Signed)
New Message    *STAT* If patient is at the pharmacy, call can be transferred to refill team.   1. Which medications need to be refilled? (please list name of each medication and dose if known) rosuvastatin (CRESTOR) 40 MG tablet  2. Which pharmacy/location (including street and city if local pharmacy) is medication to be sent to? [place], [place]  3. Do they need a 30 day or 90 day supply? [place]

## 2018-08-21 ENCOUNTER — Encounter: Payer: Self-pay | Admitting: Hematology and Oncology

## 2018-08-21 NOTE — Assessment & Plan Note (Signed)
The patient had prior history of testicular cancer status post surgery and radiation therapy CT imaging show no evidence of disease recurrence

## 2018-08-21 NOTE — Assessment & Plan Note (Signed)
His recent [name] is not the cause of his pancytopenia He has no organ damage For that, I recommend once a year follow-up

## 2018-08-21 NOTE — Progress Notes (Signed)
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[facility]) I have reviewed imaging studies with the patient and his wife The acquired pancytopenia seen is likely due to sequestration from splenomegaly and concurrent liver disease He is not symptomatic He does not need transfusion For now, I do not recommend bone marrow aspirate or biopsy  [name] (monoclonal gammopathy of unknown significance) His recent [name] is not the cause of his pancytopenia He has no organ damage For that, I recommend once a year follow-up  Fatty liver disease, nonalcoholic CT imaging study clearly shows evidence of liver cirrhosis and splenomegaly, likely secondary to fatty liver disease We had extensive discussion about the importance of weight loss management and dietary modification  History of primary testicular cancer The patient had prior history of testicular cancer status post surgery and radiation therapy CT imaging show no evidence of disease recurrence   Orders Placed This Encounter  Procedures  . CBC with Differential/Platelet    Standing Status:   Future    Standing Expiration Date:   09/25/2019    INTERVAL HISTORY: Please see below for problem oriented charting. He returns with his wife for further follow-up Except for excessive bruising, he has no new complaints  SUMMARY OF ONCOLOGIC HISTORY:  Huey G Sermons Brooke Bonito. is here because of thrombocytopenia. 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  He was found to have abnormal CBC from abnormal blood work from routine blood work monitoring. I have reviewed outside records from his  primary care doctor.  He is noted to have mild pancytopenia since 11/17/2015. On 11/17/2015, white blood cell count 5.9, hemoglobin 12.7 and platelet count 106 On 03/13/2016, white blood cell count 6.6, hemoglobin 12.6 and platelet count 103 On 09/06/2015, white blood cell count 6.6, hemoglobin 11.9 and platelet count 102 On 10/25/2016, white count 5.2, hemoglobin 12.7 and platelet count 86 On 11/08/16, white blood cell count 5.3, hemoglobin 12.2 implant, 107. On 09/05/2016, serum ferritin was high at 220, iron study showed serum iron low at 24 and 9% iron saturation.  Some older scanned records dated back to 12/13/2011 showed low platelet count of 101 On 12/21/2011, he had normal platelet count of 210 On 07/30/2012, his platelet count was borderline low at 143  On 09/08/2016, [name], such as spontaneous epistaxis, hematuria, 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  He is being observed Repeat CT imaging of the abdomen and pelvis in August 2019 confirmed liver cirrhosis and splenomegaly  REVIEW OF SYSTEMS:   Constitutional: Denies fevers, chills or abnormal weight loss Eyes: Denies blurriness of vision  Ears, nose, mouth, throat, and face: Denies mucositis or sore throat Respiratory: Denies cough, dyspnea or wheezes Cardiovascular: Denies  palpitation, chest discomfort or lower extremity swelling Gastrointestinal:  Denies nausea, heartburn or change in bowel habits Skin: Denies abnormal skin rashes Lymphatics: Denies new lymphadenopathy  Neurological:Denies numbness, tingling or new weaknesses Behavioral/Psych: Mood is stable, no new changes  All other systems were reviewed with the patient and are negative.  I have reviewed the past medical history, past surgical history, social history and family history with the patient and they are unchanged from previous note.  ALLERGIES:  is allergic to allopurinol and enbrel [etanercept].  MEDICATIONS:  Current Outpatient Medications  Medication Sig Dispense Refill  . allopurinol (ZYLOPRIM) 300 MG tablet Take 1 tablet by mouth daily.    . colchicine 0.6 MG tablet Take 1.2 mg by mouth then 0.6 mg one hour later as needed for gout flares/as directed  1  . furosemide (LASIX) 40 MG tablet Take 1 tablet (40 mg total) by mouth 2 (two) times daily. 60 tablet 3  . metoprolol tartrate (LOPRESSOR) 50 MG tablet TAKE 1 TABLET TWICE DAILY 180 tablet 3  . omega-3 acid ethyl esters (LOVAZA) 1 G capsule Take 2 g by mouth 2 (two) times daily.     [name] oxybutynin (DITROPAN-XL) 10 MG 24 hr tablet Take 10 mg by mouth daily.     . pantoprazole (PROTONIX) 40 MG tablet Take 40 mg by mouth daily.    . potassium chloride SA (K-DUR,KLOR-CON) 20 MEQ tablet Take 20 mEq by mouth daily.      . rosuvastatin (CRESTOR) 40 MG tablet Take 1 tablet (40 mg total) by mouth daily. DUE FOR APPT 90 tablet 2  . sertraline (ZOLOFT) 100 MG tablet Take 100 mg by mouth daily.    . Tamsulosin HCl (FLOMAX) 0.4 MG CAPS Take 0.4 mg by mouth at bedtime.      No current facility-administered medications for this visit.     PHYSICAL EXAMINATION: ECOG PERFORMANCE STATUS: 0 - Asymptomatic  Vitals:   08/20/18 1131  BP: 125/64  Pulse: 95  Resp: 18  Temp: 97.6 F (36.4 C)  SpO2: 99%   Filed Weights   08/20/18 1131  Weight: 215  lb 6.4 oz (97.7 kg)    GENERAL:alert, no distress and comfortable SKIN: skin color, texture, turgor are normal, no rashes or significant lesions.  Noted excessive skin bruises NEURO: alert & oriented x 3 with fluent speech, no focal motor/sensory deficits  LABORATORY DATA:  I have reviewed the data as listed    Component Value Date/Time   NA 142 08/12/2018 1152   NA 143 04/23/2018 0952   NA 141 02/23/2017 1237   K 3.3 (L) 08/12/2018 1152   K 3.8 02/23/2017 1237   CL 105 08/12/2018 1152   CO2 30 08/12/2018 1152   CO2 27 02/23/2017 1237   GLUCOSE 146 (H) 08/12/2018 1152   GLUCOSE 113 02/23/2017 1237   BUN 16 08/12/2018 1152   BUN 20 04/23/2018 0952   BUN 18.3 02/23/2017 1237   CREATININE 1.31 (H) 08/12/2018 1152   CREATININE 1.2 02/23/2017 1237   CALCIUM 9.5 08/12/2018 1152   CALCIUM 9.6 02/23/2017 1237   PROT 8.0 08/12/2018 1152   PROT 7.3 06/18/2018 1205   PROT 8.0 02/23/2017 1237   ALBUMIN 3.8 08/12/2018 1152   ALBUMIN 4.1 09/24/2017 0824   ALBUMIN 4.1 02/23/2017 1237   AST 25 08/12/2018 1152   AST 24 02/23/2017 1237   ALT 16  08/12/2018 1152   ALT 15 02/23/2017 1237   ALKPHOS 101 08/12/2018 1152   ALKPHOS 105 02/23/2017 1237   BILITOT 0.7 08/12/2018 1152   BILITOT 0.6 09/24/2017 0824   BILITOT 0.98 02/23/2017 1237   GFRNONAA 52 (L) 08/12/2018 1152   GFRAA 60 (L) 08/12/2018 1152    No results found for: SPEP, UPEP  Lab Results  Component Value Date   WBC 3.6 (L) 08/12/2018   NEUTROABS 2.8 08/12/2018   HGB 10.1 (L) 08/12/2018   HCT 31.3 (L) 08/12/2018   MCV 88.8 08/12/2018   PLT 84 (L) 08/12/2018      Chemistry      Component Value Date/Time   NA 142 08/12/2018 1152   NA 143 04/23/2018 0952   NA 141 02/23/2017 1237   K 3.3 (L) 08/12/2018 1152   K 3.8 02/23/2017 1237   CL 105 08/12/2018 1152   CO2 30 08/12/2018 1152   CO2 27 02/23/2017 1237   BUN 16 08/12/2018 1152   BUN 20 04/23/2018 0952   BUN 18.3 02/23/2017 1237   CREATININE 1.31 (H)  08/12/2018 1152   CREATININE 1.2 02/23/2017 1237      Component Value Date/Time   CALCIUM 9.5 08/12/2018 1152   CALCIUM 9.6 02/23/2017 1237   ALKPHOS 101 08/12/2018 1152   ALKPHOS 105 02/23/2017 1237   AST 25 08/12/2018 1152   AST 24 02/23/2017 1237   ALT 16 08/12/2018 1152   ALT 15 02/23/2017 1237   BILITOT 0.7 08/12/2018 1152   BILITOT 0.6 09/24/2017 0824   BILITOT 0.98 02/23/2017 1237       RADIOGRAPHIC STUDIES: I have reviewed imaging study with the patient and his wife I have personally reviewed the radiological images as listed and agreed with the findings in the report. Ct Abdomen Pelvis W Contrast  Result Date: 08/16/2018 CLINICAL DATA:  Pancytopenia, fatty liver, recurrent falls. History of testicular cancer. EXAM: CT ABDOMEN AND PELVIS WITH CONTRAST TECHNIQUE: Multidetector CT imaging of the abdomen and pelvis was performed using the standard protocol following bolus administration of intravenous contrast. CONTRAST:  122mL OMNIPAQUE IOHEXOL 300 MG/ML  SOLN COMPARISON:  09/08/2016 FINDINGS: Lower chest: Emphysematous changes with subpleural reticulation at the lung bases. Cardiomegaly.  Pacemaker leads, incompletely visualized. Hepatobiliary: Nodular hepatic contour, suggesting cirrhosis. No focal hepatic lesion is seen. Status post cholecystectomy. No intrahepatic or extrahepatic ductal dilatation. Pancreas: Within normal limits. Spleen: Splenomegaly, measuring 17.0 cm in maximal craniocaudal dimension. Adrenals/Urinary Tract: Adrenal glands within normal limits. Bilateral renal cysts, measuring up to 3.2 cm in the medial left upper kidney. No hydronephrosis. Bladder is thick-walled although underdistended. Stomach/Bowel: Stomach is notable for a tiny hiatal hernia. No evidence of bowel obstruction. Sigmoid diverticulosis, without evidence of diverticulitis. Vascular/Lymphatic: No evidence of abdominal aortic aneurysm. Atherosclerotic calcifications of the abdominal aorta and branch  vessels. Portal vein is patent. No suspicious abdominopelvic lymphadenopathy. Reproductive: Prostate is unremarkable. Postsurgical changes related to left orchiectomy. Other: Trace pelvic ascites. Postsurgical changes related to prior ventral hernia mesh repair. Musculoskeletal: Degenerative changes of the visualized thoracolumbar spine. Median sternotomy. IMPRESSION: Status post left orchiectomy. No evidence of recurrent or metastatic disease. Cirrhosis. No findings suspicious for hepatocellular carcinoma. Splenomegaly. Small volume pelvic ascites. Portal vein is patent. Prior cholecystectomy.  Prior ventral hernia mesh repair. Electronically Signed   By: Julian Hy M.D.   On: 08/16/2018 09:25    All questions were answered. The patient knows to call the clinic with any problems, questions or concerns. No barriers to learning  was detected.  I spent 15 minutes counseling the patient face to face. The total time spent in the appointment was 20 minutes and more than 50% was on counseling and review of test results  [name], MD 08/21/2018 7:29 AM

## 2018-08-21 NOTE — Assessment & Plan Note (Signed)
CT imaging study clearly shows evidence of liver cirrhosis and splenomegaly, likely secondary to fatty liver disease We had extensive discussion about the importance of weight loss management and dietary modification

## 2018-08-21 NOTE — Assessment & Plan Note (Signed)
I have reviewed imaging studies with the patient and his wife The acquired pancytopenia seen is likely due to sequestration from splenomegaly and concurrent liver disease He is not symptomatic He does not need transfusion For now, I do not recommend bone marrow aspirate or biopsy

## 2018-08-22 DIAGNOSIS — M6281 Muscle weakness (generalized): Secondary | ICD-10-CM | POA: Diagnosis not present

## 2018-08-22 DIAGNOSIS — R296 Repeated falls: Secondary | ICD-10-CM | POA: Diagnosis not present

## 2018-08-22 DIAGNOSIS — R260 Ataxic gait: Secondary | ICD-10-CM | POA: Diagnosis not present

## 2018-08-27 DIAGNOSIS — R296 Repeated falls: Secondary | ICD-10-CM | POA: Diagnosis not present

## 2018-08-27 DIAGNOSIS — M6281 Muscle weakness (generalized): Secondary | ICD-10-CM | POA: Diagnosis not present

## 2018-08-27 DIAGNOSIS — R260 Ataxic gait: Secondary | ICD-10-CM | POA: Diagnosis not present

## 2018-08-29 DIAGNOSIS — R296 Repeated falls: Secondary | ICD-10-CM | POA: Diagnosis not present

## 2018-08-29 DIAGNOSIS — R260 Ataxic gait: Secondary | ICD-10-CM | POA: Diagnosis not present

## 2018-08-29 DIAGNOSIS — M6281 Muscle weakness (generalized): Secondary | ICD-10-CM | POA: Diagnosis not present

## 2018-09-03 DIAGNOSIS — R260 Ataxic gait: Secondary | ICD-10-CM | POA: Diagnosis not present

## 2018-09-03 DIAGNOSIS — M6281 Muscle weakness (generalized): Secondary | ICD-10-CM | POA: Diagnosis not present

## 2018-09-03 DIAGNOSIS — R296 Repeated falls: Secondary | ICD-10-CM | POA: Diagnosis not present

## 2018-09-05 DIAGNOSIS — M6281 Muscle weakness (generalized): Secondary | ICD-10-CM | POA: Diagnosis not present

## 2018-09-05 DIAGNOSIS — R260 Ataxic gait: Secondary | ICD-10-CM | POA: Diagnosis not present

## 2018-09-05 DIAGNOSIS — R296 Repeated falls: Secondary | ICD-10-CM | POA: Diagnosis not present

## 2018-09-10 DIAGNOSIS — R296 Repeated falls: Secondary | ICD-10-CM | POA: Diagnosis not present

## 2018-09-10 DIAGNOSIS — M6281 Muscle weakness (generalized): Secondary | ICD-10-CM | POA: Diagnosis not present

## 2018-09-10 DIAGNOSIS — R260 Ataxic gait: Secondary | ICD-10-CM | POA: Diagnosis not present

## 2018-09-12 DIAGNOSIS — M6281 Muscle weakness (generalized): Secondary | ICD-10-CM | POA: Diagnosis not present

## 2018-09-12 DIAGNOSIS — R296 Repeated falls: Secondary | ICD-10-CM | POA: Diagnosis not present

## 2018-09-12 DIAGNOSIS — R260 Ataxic gait: Secondary | ICD-10-CM | POA: Diagnosis not present

## 2018-09-24 DIAGNOSIS — R260 Ataxic gait: Secondary | ICD-10-CM | POA: Diagnosis not present

## 2018-09-24 DIAGNOSIS — M6281 Muscle weakness (generalized): Secondary | ICD-10-CM | POA: Diagnosis not present

## 2018-09-24 DIAGNOSIS — E7800 Pure hypercholesterolemia, unspecified: Secondary | ICD-10-CM | POA: Diagnosis not present

## 2018-09-24 DIAGNOSIS — R296 Repeated falls: Secondary | ICD-10-CM | POA: Diagnosis not present

## 2018-09-26 DIAGNOSIS — R296 Repeated falls: Secondary | ICD-10-CM | POA: Diagnosis not present

## 2018-09-26 DIAGNOSIS — Z23 Encounter for immunization: Secondary | ICD-10-CM | POA: Diagnosis not present

## 2018-09-26 DIAGNOSIS — Z9181 History of falling: Secondary | ICD-10-CM | POA: Diagnosis not present

## 2018-09-26 DIAGNOSIS — R260 Ataxic gait: Secondary | ICD-10-CM | POA: Diagnosis not present

## 2018-09-26 DIAGNOSIS — M6281 Muscle weakness (generalized): Secondary | ICD-10-CM | POA: Diagnosis not present

## 2018-10-03 DIAGNOSIS — R260 Ataxic gait: Secondary | ICD-10-CM | POA: Diagnosis not present

## 2018-10-03 DIAGNOSIS — R296 Repeated falls: Secondary | ICD-10-CM | POA: Diagnosis not present

## 2018-10-03 DIAGNOSIS — M6281 Muscle weakness (generalized): Secondary | ICD-10-CM | POA: Diagnosis not present

## 2018-10-08 DIAGNOSIS — M6281 Muscle weakness (generalized): Secondary | ICD-10-CM | POA: Diagnosis not present

## 2018-10-08 DIAGNOSIS — R296 Repeated falls: Secondary | ICD-10-CM | POA: Diagnosis not present

## 2018-10-08 DIAGNOSIS — R260 Ataxic gait: Secondary | ICD-10-CM | POA: Diagnosis not present

## 2018-10-10 DIAGNOSIS — M6281 Muscle weakness (generalized): Secondary | ICD-10-CM | POA: Diagnosis not present

## 2018-10-10 DIAGNOSIS — R260 Ataxic gait: Secondary | ICD-10-CM | POA: Diagnosis not present

## 2018-10-10 DIAGNOSIS — R296 Repeated falls: Secondary | ICD-10-CM | POA: Diagnosis not present

## 2018-10-14 DIAGNOSIS — R260 Ataxic gait: Secondary | ICD-10-CM | POA: Diagnosis not present

## 2018-10-14 DIAGNOSIS — M6281 Muscle weakness (generalized): Secondary | ICD-10-CM | POA: Diagnosis not present

## 2018-10-14 DIAGNOSIS — R296 Repeated falls: Secondary | ICD-10-CM | POA: Diagnosis not present

## 2018-10-17 DIAGNOSIS — R260 Ataxic gait: Secondary | ICD-10-CM | POA: Diagnosis not present

## 2018-10-17 DIAGNOSIS — M6281 Muscle weakness (generalized): Secondary | ICD-10-CM | POA: Diagnosis not present

## 2018-10-17 DIAGNOSIS — R296 Repeated falls: Secondary | ICD-10-CM | POA: Diagnosis not present

## 2018-10-21 DIAGNOSIS — R260 Ataxic gait: Secondary | ICD-10-CM | POA: Diagnosis not present

## 2018-10-21 DIAGNOSIS — M6281 Muscle weakness (generalized): Secondary | ICD-10-CM | POA: Diagnosis not present

## 2018-10-21 DIAGNOSIS — R296 Repeated falls: Secondary | ICD-10-CM | POA: Diagnosis not present

## 2018-10-23 DIAGNOSIS — R260 Ataxic gait: Secondary | ICD-10-CM | POA: Diagnosis not present

## 2018-10-23 DIAGNOSIS — R296 Repeated falls: Secondary | ICD-10-CM | POA: Diagnosis not present

## 2018-10-23 DIAGNOSIS — M6281 Muscle weakness (generalized): Secondary | ICD-10-CM | POA: Diagnosis not present

## 2018-10-30 DIAGNOSIS — R260 Ataxic gait: Secondary | ICD-10-CM | POA: Diagnosis not present

## 2018-10-30 DIAGNOSIS — M6281 Muscle weakness (generalized): Secondary | ICD-10-CM | POA: Diagnosis not present

## 2018-10-30 DIAGNOSIS — G4733 Obstructive sleep apnea (adult) (pediatric): Secondary | ICD-10-CM | POA: Diagnosis not present

## 2018-10-30 DIAGNOSIS — R296 Repeated falls: Secondary | ICD-10-CM | POA: Diagnosis not present

## 2018-10-30 DIAGNOSIS — M7989 Other specified soft tissue disorders: Secondary | ICD-10-CM | POA: Diagnosis not present

## 2018-11-01 DIAGNOSIS — R260 Ataxic gait: Secondary | ICD-10-CM | POA: Diagnosis not present

## 2018-11-01 DIAGNOSIS — R296 Repeated falls: Secondary | ICD-10-CM | POA: Diagnosis not present

## 2018-11-01 DIAGNOSIS — M6281 Muscle weakness (generalized): Secondary | ICD-10-CM | POA: Diagnosis not present

## 2018-11-05 DIAGNOSIS — R260 Ataxic gait: Secondary | ICD-10-CM | POA: Diagnosis not present

## 2018-11-05 DIAGNOSIS — R296 Repeated falls: Secondary | ICD-10-CM | POA: Diagnosis not present

## 2018-11-05 DIAGNOSIS — M6281 Muscle weakness (generalized): Secondary | ICD-10-CM | POA: Diagnosis not present

## 2018-11-07 DIAGNOSIS — R296 Repeated falls: Secondary | ICD-10-CM | POA: Diagnosis not present

## 2018-11-07 DIAGNOSIS — R260 Ataxic gait: Secondary | ICD-10-CM | POA: Diagnosis not present

## 2018-11-07 DIAGNOSIS — M6281 Muscle weakness (generalized): Secondary | ICD-10-CM | POA: Diagnosis not present

## 2018-11-08 DIAGNOSIS — R350 Frequency of micturition: Secondary | ICD-10-CM | POA: Diagnosis not present

## 2018-11-08 DIAGNOSIS — N401 Enlarged prostate with lower urinary tract symptoms: Secondary | ICD-10-CM | POA: Diagnosis not present

## 2018-11-08 DIAGNOSIS — R3915 Urgency of urination: Secondary | ICD-10-CM | POA: Diagnosis not present

## 2018-11-11 DIAGNOSIS — M6281 Muscle weakness (generalized): Secondary | ICD-10-CM | POA: Diagnosis not present

## 2018-11-11 DIAGNOSIS — R260 Ataxic gait: Secondary | ICD-10-CM | POA: Diagnosis not present

## 2018-11-11 DIAGNOSIS — R296 Repeated falls: Secondary | ICD-10-CM | POA: Diagnosis not present

## 2018-11-13 DIAGNOSIS — R260 Ataxic gait: Secondary | ICD-10-CM | POA: Diagnosis not present

## 2018-11-13 DIAGNOSIS — R296 Repeated falls: Secondary | ICD-10-CM | POA: Diagnosis not present

## 2018-11-13 DIAGNOSIS — M6281 Muscle weakness (generalized): Secondary | ICD-10-CM | POA: Diagnosis not present

## 2018-11-18 DIAGNOSIS — M6281 Muscle weakness (generalized): Secondary | ICD-10-CM | POA: Diagnosis not present

## 2018-11-18 DIAGNOSIS — R260 Ataxic gait: Secondary | ICD-10-CM | POA: Diagnosis not present

## 2018-11-18 DIAGNOSIS — R296 Repeated falls: Secondary | ICD-10-CM | POA: Diagnosis not present

## 2018-11-19 ENCOUNTER — Telehealth: Payer: Self-pay | Admitting: Hematology and Oncology

## 2018-11-19 ENCOUNTER — Inpatient Hospital Stay (HOSPITAL_BASED_OUTPATIENT_CLINIC_OR_DEPARTMENT_OTHER): Payer: Medicare Other | Admitting: Hematology and Oncology

## 2018-11-19 ENCOUNTER — Inpatient Hospital Stay: Payer: Medicare Other | Attending: Hematology and Oncology

## 2018-11-19 VITALS — BP 114/70 | HR 96 | Temp 97.7°F | Resp 18 | Ht 68.0 in | Wt 212.2 lb

## 2018-11-19 DIAGNOSIS — Z950 Presence of cardiac pacemaker: Secondary | ICD-10-CM | POA: Diagnosis not present

## 2018-11-19 DIAGNOSIS — R296 Repeated falls: Secondary | ICD-10-CM | POA: Diagnosis not present

## 2018-11-19 DIAGNOSIS — Z79899 Other long term (current) drug therapy: Secondary | ICD-10-CM | POA: Diagnosis not present

## 2018-11-19 DIAGNOSIS — D472 Monoclonal gammopathy: Secondary | ICD-10-CM

## 2018-11-19 DIAGNOSIS — I2510 Atherosclerotic heart disease of native coronary artery without angina pectoris: Secondary | ICD-10-CM | POA: Insufficient documentation

## 2018-11-19 DIAGNOSIS — R600 Localized edema: Secondary | ICD-10-CM | POA: Insufficient documentation

## 2018-11-19 DIAGNOSIS — Z87898 Personal history of other specified conditions: Secondary | ICD-10-CM | POA: Insufficient documentation

## 2018-11-19 DIAGNOSIS — D539 Nutritional anemia, unspecified: Secondary | ICD-10-CM | POA: Insufficient documentation

## 2018-11-19 DIAGNOSIS — R5383 Other fatigue: Secondary | ICD-10-CM

## 2018-11-19 DIAGNOSIS — D61818 Other pancytopenia: Secondary | ICD-10-CM

## 2018-11-19 DIAGNOSIS — C61 Malignant neoplasm of prostate: Secondary | ICD-10-CM

## 2018-11-19 DIAGNOSIS — Z8546 Personal history of malignant neoplasm of prostate: Secondary | ICD-10-CM | POA: Insufficient documentation

## 2018-11-19 DIAGNOSIS — K760 Fatty (change of) liver, not elsewhere classified: Secondary | ICD-10-CM

## 2018-11-19 DIAGNOSIS — R413 Other amnesia: Secondary | ICD-10-CM | POA: Diagnosis not present

## 2018-11-19 DIAGNOSIS — Z125 Encounter for screening for malignant neoplasm of prostate: Secondary | ICD-10-CM | POA: Insufficient documentation

## 2018-11-19 LAB — CBC WITH DIFFERENTIAL/PLATELET
Abs Immature Granulocytes: 0.01 10*3/uL (ref 0.00–0.07)
Basophils Absolute: 0 10*3/uL (ref 0.0–0.1)
Basophils Relative: 1 %
Eosinophils Absolute: 0.1 10*3/uL (ref 0.0–0.5)
Eosinophils Relative: 3 %
HCT: 30 % — ABNORMAL LOW (ref 39.0–52.0)
Hemoglobin: 9.3 g/dL — ABNORMAL LOW (ref 13.0–17.0)
Immature Granulocytes: 0 %
Lymphocytes Relative: 18 %
Lymphs Abs: 0.6 10*3/uL — ABNORMAL LOW (ref 0.7–4.0)
MCH: 28.8 pg (ref 26.0–34.0)
MCHC: 31 g/dL (ref 30.0–36.0)
MCV: 92.9 fL (ref 80.0–100.0)
Monocytes Absolute: 0.2 10*3/uL (ref 0.1–1.0)
Monocytes Relative: 6 %
Neutro Abs: 2.3 10*3/uL (ref 1.7–7.7)
Neutrophils Relative %: 72 %
Platelets: 82 10*3/uL — ABNORMAL LOW (ref 150–400)
RBC: 3.23 MIL/uL — ABNORMAL LOW (ref 4.22–5.81)
RDW: 17.5 % — ABNORMAL HIGH (ref 11.5–15.5)
WBC: 3.2 10*3/uL — ABNORMAL LOW (ref 4.0–10.5)
nRBC: 0 % (ref 0.0–0.2)

## 2018-11-19 NOTE — Telephone Encounter (Signed)
Gave patient avs and calendar.   °

## 2018-11-20 ENCOUNTER — Encounter: Payer: Self-pay | Admitting: Hematology and Oncology

## 2018-11-20 DIAGNOSIS — R260 Ataxic gait: Secondary | ICD-10-CM | POA: Diagnosis not present

## 2018-11-20 DIAGNOSIS — M6281 Muscle weakness (generalized): Secondary | ICD-10-CM | POA: Diagnosis not present

## 2018-11-20 DIAGNOSIS — R296 Repeated falls: Secondary | ICD-10-CM | POA: Diagnosis not present

## 2018-11-20 NOTE — Progress Notes (Signed)
[name]  [name], MD  ASSESSMENT & PLAN:  Pancytopenia, acquired [facility]) I am concerned about progressive pancytopenia The patient also have lack of energy I am concerned about potential myelodysplastic syndrome versus nutritional deficiency I recommend him to return in 2 to 3 weeks with repeat blood draw to be done a week ahead of time The patient bruises easily but denies significant bleeding For now, he does not require transfusion support   Orders Placed This Encounter  Procedures  . Iron and TIBC    Standing Status:   Future    Standing Expiration Date:   12/24/2019  . Sedimentation rate    Standing Status:   Future    Standing Expiration Date:   12/24/2019  . Vitamin B12    Standing Status:   Future    Standing Expiration Date:   12/24/2019  . Haptoglobin    Standing Status:   Future    Standing Expiration Date:   12/24/2019  . Ferritin    Standing Status:   Future    Standing Expiration Date:   12/24/2019  . Erythropoietin    Standing Status:   Future    Standing Expiration Date:   12/24/2019  . Folate RBC    Standing Status:   Future    Standing Expiration Date:   12/24/2019  . Lactate dehydrogenase    Standing Status:   Future    Standing Expiration Date:   11/20/2019  . CBC with Differential/Platelet    Standing Status:   Future    Standing Expiration Date:   12/24/2019  . Comprehensive metabolic panel    Standing Status:   Future    Standing Expiration Date:   12/24/2019  . Uric acid    Standing Status:   Future    Standing Expiration Date:   11/20/2019  . Multiple Myeloma Panel (SPEP&IFE w/QIG)    Standing Status:   Future    Standing Expiration Date:   12/24/2019  . Kappa/lambda light chains    Standing Status:   Future    Standing Expiration Date:   12/24/2019  . Beta 2 microglobulin, serum    Standing Status:   Future    Standing Expiration Date:   12/24/2019  . PSA, total and free    Standing Status:   Future    Standing  Expiration Date:   12/24/2019  . TSH    Standing Status:   Future    Standing Expiration Date:   12/24/2019  . Direct antiglobulin test (not at [facility])    Standing Status:   Future    Standing Expiration Date:   12/24/2019    INTERVAL HISTORY: [name]. 77 y.o. male returns for further follow-up in regards to chronic pancytopenia He bruises easily He has chronic fatigue No recent infection, fever or chills He denies bone pain He denies cough, chest pain or shortness of breath  SUMMARY OF HEMATOLOGIC HISTORY:  Jamie G Mccroskey Jr. is here because of thrombocytopenia. 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  He was found to have abnormal CBC from abnormal blood work from routine blood work monitoring. I have reviewed outside records from his primary care doctor.  He is noted to have mild pancytopenia since 11/17/2015. On 11/17/2015, white blood cell count 5.9, hemoglobin 12.7 and platelet count 106 On 03/13/2016, white blood cell count 6.6, hemoglobin 12.6 and platelet  count 103 On 09/06/2015, white blood cell count 6.6, hemoglobin 11.9 and platelet count 102 On 10/25/2016, white count 5.2, hemoglobin 12.7 and platelet count 86 On 11/08/16, white blood cell count 5.3, hemoglobin 12.2 implant, 107. On 09/05/2016, serum ferritin was high at 220, iron study showed serum iron low at 24 and 9% iron saturation.  Some older scanned records dated back to 12/13/2011 showed low platelet count of 101 On 12/21/2011, he had normal platelet count of 210 On 07/30/2012, his platelet count was borderline low at 143  On 09/08/2016, [name], such as spontaneous epistaxis, hematuria, melena or  hematochezia. 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  He is being observed Repeat CT imaging of the abdomen and pelvis in August 2019 confirmed liver cirrhosis and splenomegaly  I have reviewed the past medical history, past surgical history, social history and family history with the patient and they are unchanged from previous [name].  ALLERGIES:  is allergic to allopurinol and enbrel [etanercept].  MEDICATIONS:  Current Outpatient Medications  Medication Sig Dispense Refill  . allopurinol (ZYLOPRIM) 300 MG tablet Take 1 tablet by mouth daily.    . colchicine 0.6 MG tablet Take 1.2 mg by mouth then 0.6 mg one hour later as needed for gout flares/as directed  1  . furosemide (LASIX) 40 MG tablet Take 1 tablet (40 mg total) by mouth 2 (two) times daily. 60 tablet 3  . metoprolol tartrate (LOPRESSOR) 50 MG tablet TAKE 1 TABLET TWICE DAILY 180 tablet 3  . omega-3 acid ethyl esters (LOVAZA) 1 G capsule Take 2 g by mouth 2 (two) times daily.     [name] oxybutynin (DITROPAN-XL) 10 MG 24 hr tablet Take 10 mg by mouth daily.     . pantoprazole (PROTONIX) 40 MG tablet Take 40 mg by mouth daily.    . potassium chloride SA (K-DUR,KLOR-CON) 20 MEQ tablet Take 20 mEq by mouth daily.      . rosuvastatin (CRESTOR) 40 MG tablet Take 1 tablet (40 mg total) by mouth daily. DUE FOR APPT 90 tablet 2  . sertraline (ZOLOFT) 100 MG tablet Take 100 mg by mouth daily.    . Tamsulosin HCl (FLOMAX) 0.4 MG CAPS Take 0.4 mg by mouth at bedtime.      No current facility-administered medications for  this visit.      REVIEW OF SYSTEMS:   Constitutional: Denies fevers, chills or night sweats Eyes: Denies blurriness of vision Ears, nose, mouth, throat, and face: Denies mucositis or sore throat Respiratory: Denies cough, dyspnea or wheezes Cardiovascular: Denies palpitation, chest discomfort or lower extremity swelling Gastrointestinal:  Denies nausea, heartburn or change in bowel habits Skin: Denies abnormal skin rashes Lymphatics: Denies new lymphadenopathy or easy bruising Neurological:Denies numbness, tingling or new weaknesses Behavioral/Psych: Mood is stable, no new changes  All other systems were reviewed with the patient and are negative.  PHYSICAL EXAMINATION: ECOG PERFORMANCE STATUS:  1 - Symptomatic but completely ambulatory  Vitals:   11/19/18 1041  BP: 114/70  Pulse: 96  Resp: 18  Temp: 97.7 F (36.5 C)  SpO2: 98%   Filed Weights   11/19/18 1041  Weight: 212 lb 3.2 oz (96.3 kg)    GENERAL:alert, no distress and comfortable.  He is morbidly obese SKIN: He has excessive skin bruises Musculoskeletal:no cyanosis of digits and no clubbing  NEURO: alert & oriented x 3 with fluent speech, no focal motor/sensory deficits  LABORATORY DATA:  I have reviewed the data as listed     Component Value Date/Time   NA 142 08/12/2018 1152   NA 143 04/23/2018 0952   NA 141 02/23/2017 1237   K 3.3 (L) 08/12/2018 1152   K 3.8 02/23/2017 1237   CL 105 08/12/2018 1152   CO2 30 08/12/2018 1152   CO2 27 02/23/2017 1237   GLUCOSE 146 (H) 08/12/2018 1152   GLUCOSE 113 02/23/2017 1237   BUN 16 08/12/2018 1152   BUN 20 04/23/2018 0952   BUN 18.3 02/23/2017 1237   CREATININE 1.31 (H) 08/12/2018 1152   CREATININE 1.2 02/23/2017 1237   CALCIUM 9.5 08/12/2018 1152   CALCIUM 9.6 02/23/2017 1237   PROT 8.0 08/12/2018 1152   PROT 7.3 06/18/2018 1205   PROT 8.0 02/23/2017 1237   ALBUMIN 3.8 08/12/2018 1152   ALBUMIN 4.1 09/24/2017 0824   ALBUMIN 4.1 02/23/2017 1237   AST 25  08/12/2018 1152   AST 24 02/23/2017 1237   ALT 16 08/12/2018 1152   ALT 15 02/23/2017 1237   ALKPHOS 101 08/12/2018 1152   ALKPHOS 105 02/23/2017 1237   BILITOT 0.7 08/12/2018 1152   BILITOT 0.6 09/24/2017 0824   BILITOT 0.98 02/23/2017 1237   GFRNONAA 52 (L) 08/12/2018 1152   GFRAA 60 (L) 08/12/2018 1152    No results found for: SPEP, UPEP  Lab Results  Component Value Date   WBC 3.2 (L) 11/19/2018   NEUTROABS 2.3 11/19/2018   HGB 9.3 (L) 11/19/2018   HCT 30.0 (L) 11/19/2018   MCV 92.9 11/19/2018   PLT 82 (L) 11/19/2018      Chemistry      Component Value Date/Time   NA 142 08/12/2018 1152   NA 143 04/23/2018 0952   NA 141 02/23/2017 1237   K 3.3 (L) 08/12/2018 1152   K 3.8 02/23/2017 1237   CL 105 08/12/2018 1152   CO2 30 08/12/2018 1152   CO2 27 02/23/2017 1237   BUN 16 08/12/2018 1152   BUN 20 04/23/2018 0952   BUN 18.3 02/23/2017 1237   CREATININE 1.31 (H) 08/12/2018 1152   CREATININE 1.2 02/23/2017 1237      Component Value Date/Time   CALCIUM 9.5 08/12/2018 1152   CALCIUM 9.6 02/23/2017 1237   ALKPHOS 101 08/12/2018 1152   ALKPHOS 105 02/23/2017 1237   AST 25 08/12/2018 1152   AST 24 02/23/2017 1237   ALT 16 08/12/2018 1152   ALT 15 02/23/2017 1237   BILITOT 0.7 08/12/2018 1152   BILITOT 0.6 09/24/2017 0824   BILITOT 0.98 02/23/2017 1237      I spent 10 minutes counseling the patient face to face. The total time spent in the appointment was 15 minutes and more than 50% was on counseling.   All questions were answered. The patient knows to call the clinic with any problems, questions or concerns. No barriers to learning was detected.    [name], MD 12/4/20197:47 AM

## 2018-11-20 NOTE — Assessment & Plan Note (Signed)
I am concerned about progressive pancytopenia The patient also have lack of energy I am concerned about potential myelodysplastic syndrome versus nutritional deficiency I recommend him to return in 2 to 3 weeks with repeat blood draw to be done a week ahead of time The patient bruises easily but denies significant bleeding For now, he does not require transfusion support

## 2018-11-22 ENCOUNTER — Encounter (HOSPITAL_COMMUNITY): Payer: Self-pay | Admitting: *Deleted

## 2018-11-22 ENCOUNTER — Encounter (HOSPITAL_COMMUNITY): Payer: Self-pay | Admitting: Family Medicine

## 2018-11-22 ENCOUNTER — Other Ambulatory Visit: Payer: Self-pay

## 2018-11-22 ENCOUNTER — Ambulatory Visit (INDEPENDENT_AMBULATORY_CARE_PROVIDER_SITE_OTHER)
Admission: EM | Admit: 2018-11-22 | Discharge: 2018-11-22 | Disposition: A | Discharge status: Home / Self Care | Payer: Medicare Other | Source: Home / Self Care

## 2018-11-22 ENCOUNTER — Emergency Department (HOSPITAL_COMMUNITY)
Admission: EM | Admit: 2018-11-22 | Discharge: 2018-11-22 | Disposition: A | Discharge status: Home / Self Care | Payer: Medicare Other | Attending: Emergency Medicine | Admitting: Emergency Medicine

## 2018-11-22 ENCOUNTER — Emergency Department (HOSPITAL_COMMUNITY): Payer: Medicare Other

## 2018-11-22 DIAGNOSIS — R269 Unspecified abnormalities of gait and mobility: Secondary | ICD-10-CM | POA: Diagnosis not present

## 2018-11-22 DIAGNOSIS — G6289 Other specified polyneuropathies: Secondary | ICD-10-CM

## 2018-11-22 DIAGNOSIS — R609 Edema, unspecified: Secondary | ICD-10-CM

## 2018-11-22 DIAGNOSIS — R441 Visual hallucinations: Secondary | ICD-10-CM | POA: Diagnosis not present

## 2018-11-22 DIAGNOSIS — R2689 Other abnormalities of gait and mobility: Secondary | ICD-10-CM | POA: Insufficient documentation

## 2018-11-22 DIAGNOSIS — S299XXA Unspecified injury of thorax, initial encounter: Secondary | ICD-10-CM | POA: Diagnosis not present

## 2018-11-22 DIAGNOSIS — Z79899 Other long term (current) drug therapy: Secondary | ICD-10-CM | POA: Diagnosis not present

## 2018-11-22 DIAGNOSIS — Z950 Presence of cardiac pacemaker: Secondary | ICD-10-CM | POA: Insufficient documentation

## 2018-11-22 DIAGNOSIS — E785 Hyperlipidemia, unspecified: Secondary | ICD-10-CM | POA: Diagnosis not present

## 2018-11-22 DIAGNOSIS — I10 Essential (primary) hypertension: Secondary | ICD-10-CM | POA: Diagnosis not present

## 2018-11-22 DIAGNOSIS — R296 Repeated falls: Secondary | ICD-10-CM | POA: Diagnosis not present

## 2018-11-22 DIAGNOSIS — I2510 Atherosclerotic heart disease of native coronary artery without angina pectoris: Secondary | ICD-10-CM | POA: Insufficient documentation

## 2018-11-22 DIAGNOSIS — S199XXA Unspecified injury of neck, initial encounter: Secondary | ICD-10-CM | POA: Diagnosis not present

## 2018-11-22 DIAGNOSIS — D61818 Other pancytopenia: Secondary | ICD-10-CM

## 2018-11-22 DIAGNOSIS — I4891 Unspecified atrial fibrillation: Secondary | ICD-10-CM | POA: Diagnosis not present

## 2018-11-22 DIAGNOSIS — Z87891 Personal history of nicotine dependence: Secondary | ICD-10-CM | POA: Insufficient documentation

## 2018-11-22 DIAGNOSIS — R2681 Unsteadiness on feet: Secondary | ICD-10-CM

## 2018-11-22 DIAGNOSIS — Z8546 Personal history of malignant neoplasm of prostate: Secondary | ICD-10-CM | POA: Insufficient documentation

## 2018-11-22 DIAGNOSIS — E119 Type 2 diabetes mellitus without complications: Secondary | ICD-10-CM | POA: Insufficient documentation

## 2018-11-22 DIAGNOSIS — S3992XA Unspecified injury of lower back, initial encounter: Secondary | ICD-10-CM | POA: Diagnosis not present

## 2018-11-22 LAB — URINALYSIS, ROUTINE W REFLEX MICROSCOPIC
Bilirubin Urine: NEGATIVE
Glucose, UA: NEGATIVE mg/dL
Hgb urine dipstick: NEGATIVE
Ketones, ur: NEGATIVE mg/dL
Leukocytes, UA: NEGATIVE
Nitrite: NEGATIVE
Protein, ur: NEGATIVE mg/dL
Specific Gravity, Urine: 1.005 (ref 1.005–1.030)
pH: 5 (ref 5.0–8.0)

## 2018-11-22 LAB — BASIC METABOLIC PANEL WITH GFR
Anion gap: 10 (ref 5–15)
BUN: 23 mg/dL (ref 8–23)
CO2: 24 mmol/L (ref 22–32)
Calcium: 9.2 mg/dL (ref 8.9–10.3)
Chloride: 106 mmol/L (ref 98–111)
Creatinine, Ser: 1.84 mg/dL — ABNORMAL HIGH (ref 0.61–1.24)
GFR calc Af Amer: 41 mL/min — ABNORMAL LOW (ref >60)
GFR calc non Af Amer: 35 mL/min — ABNORMAL LOW (ref >60)
Glucose, Bld: 99 mg/dL (ref 70–99)
Potassium: 3.7 mmol/L (ref 3.5–5.1)
Sodium: 140 mmol/L (ref 135–145)

## 2018-11-22 LAB — CBC
HCT: 31.8 % — ABNORMAL LOW (ref 39.0–52.0)
Hemoglobin: 9.4 g/dL — ABNORMAL LOW (ref 13.0–17.0)
MCH: 27.6 pg (ref 26.0–34.0)
MCHC: 29.6 g/dL — ABNORMAL LOW (ref 30.0–36.0)
MCV: 93.5 fL (ref 80.0–100.0)
Platelets: 84 10*3/uL — ABNORMAL LOW (ref 150–400)
RBC: 3.4 MIL/uL — ABNORMAL LOW (ref 4.22–5.81)
RDW: 17.3 % — ABNORMAL HIGH (ref 11.5–15.5)
WBC: 3.9 10*3/uL — ABNORMAL LOW (ref 4.0–10.5)
nRBC: 0 % (ref 0.0–0.2)

## 2018-11-22 NOTE — ED Provider Notes (Signed)
[place]    CSN: [id number] Arrival date & time: 11/22/18  1132     History   Chief Complaint Chief Complaint  Patient presents with  . Leg Swelling    HPI [name] Summons. is a 77 y.o. male.   This is an established [name] at [employer] patient who complains about leg swelling.  Interviewing the patient's spouse, we find that the patient has been having hallucinations, progressive dementia, frequent falls, and progressive anemia.  He has a neurologist, nephrologist, oncologist, and a private primary care doctor none of whom are available today.  Patient's spouse is at her wits end because she keeps seeing her husband fall and with his hallucinations being more frequent, she does not know where to turn.  Patient fell last night.       Past Medical History:  Diagnosis Date  . (HFpEF) heart failure with preserved ejection fraction ([place])    a. 05/2013 Echo: EF 55%, mild LVH, diast dysfxn, Ao sclerosis, mildly dil LA, RV dysfxn (poorly visualized), PASP 88mmHg;  b. 03/2017 Echo: EF 55-60%, no rwma, triv MR, mildly dil RV, mod TR, PASP 85mmHg.  . Atrial fibrillation [facility])    s/p Cox Maze 1/09;  Multaq Rx d/c'd in 2014 due to pulmo fibrosis;  coumadin d/c'd in 2014 due to spontaneous subdural hematoma  . BPH (benign prostatic hyperplasia)   . CAD (coronary artery disease), native coronary artery    a. s/p CABG 12/2007;  b. Myoview 12/2011: EF 66%, no scar or ischemia; normal.  . DM (diabetes mellitus) ([place])   . Hyperlipidemia type II   . Hypertension   . 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 OSA (obstructive sleep apnea)   . Pacemaker    PPM - St. Jude  . Peripheral neuropathy 07/31/2018  . Pulmonary fibrosis ([place])    Multaq d/c'd 7/14  . Subdural hematoma ([place]) 07/2012   spontaneous;  coumadin d/c'd => no longer a candidate for anticoagulation    Patient Active Problem List   Diagnosis Date Noted  .  Deficiency anemia 11/19/2018  . Other fatigue 11/19/2018  . History of elevated PSA 11/19/2018  . Prostate cancer screening 11/19/2018  . Prostate CA ([place]) 11/19/2018  . Pancytopenia, acquired ([place]) 08/13/2018  . Recurrent falls 08/13/2018  . 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 Peripheral neuropathy 07/31/2018  . 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 Anemia, chronic disease 01/28/2018  . History of skin cancer 07/27/2017  . Fatty liver disease, nonalcoholic 93/81/8299  . Thrombocytopenia ([place]) 11/27/2016  . Lung nodule 04/23/2014  . Postinflammatory pulmonary fibrosis ([place]) 06/13/2013  . Long term (current) use of anticoagulants 11/22/2011  . Abdominal bruit 03/07/2011  . Pacemaker-St.Jude   . CAD (coronary artery disease), native coronary artery   . Hyperlipidemia type II   . OSA (obstructive sleep apnea)   . PACEMAKER, PERMANENT 08/17/2010  . DIABETES MELLITUS 08/16/2010  . HYPERLIPIDEMIA 08/16/2010  . Obesity 08/16/2010  . Essential hypertension 08/16/2010  . Atrial fibrillation ([place]) 08/16/2010  . IRREGULAR HEART RATE 08/16/2010  . SLEEP APNEA 08/16/2010  . BENIGN PROSTATIC HYPERTROPHY, HX OF 08/16/2010    Past Surgical History:  Procedure Laterality Date  . APPENDECTOMY    . CHOLECYSTECTOMY    . CORONARY ARTERY BYPASS GRAFT     x3 (left internal mammary artery to distal left anterior descending coronary artery, saphenous vain graft to second circumflex marginal branch,  saphenous vain graft to posterior descending coronary artery, endoscopic saphenous vain harvest from right thigh) and modified Cox - Maze IV procedure.  [name],MD. Electronically signed CHO/MEDQ D: 01/09/2008/ JOB: [id number] cc:  [name]  . [name]  07/30/2012   Procedure: CRANIOTOMY HEMATOMA EVACUATION SUBDURAL;  Surgeon: [name], MD;  Location: [facility];  Service: Neurosurgery;  Laterality: Right;  Right craniotomy for evacuation of  subdural hematoma  . FOOT SURGERY    . HERNIA REPAIR    . ORCHIECTOMY     Left  /  testicular cancer  . PACEMAKER PLACEMENT     PPM - St. Jude       Home Medications    Prior to Admission medications   Medication Sig Start Date End Date Taking? Authorizing Provider  allopurinol (ZYLOPRIM) 300 MG tablet Take 1 tablet by mouth daily. 06/21/16   [provider]  colchicine 0.6 MG tablet Take 1.2 mg by mouth then 0.6 mg one hour later as needed for gout flares/as directed 05/13/15   [provider]  furosemide (LASIX) 40 MG tablet Take 1 tablet (40 mg total) by mouth 2 (two) times daily. 12/12/17   [name], PA  metoprolol tartrate (LOPRESSOR) 50 MG tablet TAKE 1 TABLET TWICE DAILY 06/07/18   [name], MD  omega-3 acid ethyl esters (LOVAZA) 1 G capsule Take 2 g by mouth 2 (two) times daily.     [provider]  oxybutynin (DITROPAN-XL) 10 MG 24 hr tablet Take 10 mg by mouth daily.  04/07/13   [provider]  pantoprazole (PROTONIX) 40 MG tablet Take 40 mg by mouth daily. 02/22/17   [provider]  potassium chloride SA (K-DUR,KLOR-CON) 20 MEQ tablet Take 20 mEq by mouth daily.      [provider]  rosuvastatin (CRESTOR) 40 MG tablet Take 1 tablet (40 mg total) by mouth daily. DUE FOR APPT 08/20/18   [name], MD  sertraline (ZOLOFT) 100 MG tablet Take 100 mg by mouth daily.    [provider]  Tamsulosin HCl (FLOMAX) 0.4 MG CAPS Take 0.4 mg by mouth at bedtime.     [provider]    Family History Family History  Problem Relation Age of Onset  . Heart disease Father   . Heart attack Father   . Heart failure Father   . Heart disease Mother   . Alzheimer's disease Mother   . Dementia Mother     Social History Social History   Tobacco Use  . Smoking status: Former Smoker    Last attempt to quit: 02/21/1991    Years since quitting: 27.7  . Smokeless tobacco: Never Used  Substance Use Topics  .  Alcohol use: No    Alcohol/week: 0.0 standard drinks  . Drug use: No     Allergies   Allopurinol and Enbrel [etanercept]   Review of Systems Review of Systems   Physical Exam Triage Vital Signs ED Triage Vitals  Enc Vitals Group     BP      Pulse      Resp      Temp      Temp src      SpO2      Weight      Height      Head Circumference      Peak Flow      Pain Score      Pain Loc  Pain Edu?      Excl. in [place]?    No data found.  Updated Vital Signs BP 116/80 (BP Location: Right Arm)   Pulse 92   Temp (!) 97.3 F (36.3 C) (Oral)   Resp 18   SpO2 99%    Physical Exam  Constitutional: He appears well-developed and well-nourished.  HENT:  Right Ear: External ear normal.  Left Ear: External ear normal.  Mouth/Throat: Oropharynx is clear and moist.  Patient is wearing hearing aids but is very hard of hearing  Eyes: Conjunctivae are normal.  Neck: Normal range of motion. Neck supple.  Cardiovascular: Regular rhythm and normal heart sounds.  Pulmonary/Chest: Effort normal and breath sounds normal. He exhibits no tenderness.  Abdominal: Soft.  Musculoskeletal: Normal range of motion.  Neurological: He is alert. A sensory deficit is present. No cranial nerve deficit.  Skin:  Patient has multiple small abrasions and lacerations on his extremities from previous falls.  Nursing note and vitals reviewed.    UC Treatments / Results  Labs (all labs ordered are listed, but only abnormal results are displayed) Labs Reviewed - No data to display  EKG None  Radiology No results found.  Procedures Procedures (including critical care time)  Medications Ordered in UC Medications - No data to display  Initial Impression / Assessment and Plan / UC Course  I have reviewed the triage vital signs and the nursing notes.  Pertinent labs & imaging results that were available during my care of the patient were reviewed by me and considered in my medical decision  making (see chart for details).    Final Clinical Impressions(s) / UC Diagnoses   Final diagnoses:  Frequent falls  Other polyneuropathy  Peripheral edema  Visual hallucinations  Pancytopenia ([place])     Discharge Instructions     You need to go down to the emergency department today.  I am very concerned about the falls and the hallucinations in particular.  These need to be dealt with today.  In addition, it appears that the anemia needs to be addressed and re-evaluated today.  This is almost certainly responsible for the swelling in his legs.    ED Prescriptions    None     Controlled Substance Prescriptions  Controlled Substance Registry consulted? Not Applicable   Robyn Haber, MD 11/22/18 1228

## 2018-11-22 NOTE — ED Triage Notes (Signed)
Pt in c/o frequent fall over the last year and reports visual hallucinations over the last mth, pt reported to have anemia, pt sent by UC, pt reported to have swelling to bil lower extremities, pt had fall last night in shower, denies hitting head, & LOC, A&O & 4

## 2018-11-22 NOTE — ED Notes (Signed)
Pt and family made aware of need for UA sample.

## 2018-11-22 NOTE — Discharge Instructions (Addendum)
You need to go down to the emergency department today.  I am very concerned about the falls and the hallucinations in particular.  These need to be dealt with today.  In addition, it appears that the anemia needs to be addressed and re-evaluated today.  This is almost certainly responsible for the swelling in his legs.

## 2018-11-22 NOTE — ED Provider Notes (Signed)
[facility] EMERGENCY DEPARTMENT Provider Note   CSN: [id number] Arrival date & time: 11/22/18  1245     History   Chief Complaint Chief Complaint  Patient presents with  . Fall  . Abnormal Lab    HPI Jamie G Larnie Heart. is a 77 y.o. male.  HPI Patient is a 77 year old male who is brought to the emergency department secondary to increasing gait instability and falls over the past 6 months.  Wife actually reports he has had some gait instability for 2 years.  No clear etiology has been found.  He has been worked up by his primary care physician as well as the neurologist without a clear etiology found.  He is now been having some more memory changes and visual hallucinations over the past 6 to 8 weeks.  This is become somewhat frustrating for the wife.  She states there is no answers to this point and she is frustrated as she feels as though his symptoms have been progressing.  Seen in urgent care and sent to the ER for further evaluation.  He is supposed to use a walker but patient refuses to use a walker.  Wife reports that he uses a cane.  Patient knows it is 2019 and knows current events and the current president.  Patient has no focal complaints at this time.   Past Medical History:  Diagnosis Date  . (HFpEF) heart failure with preserved ejection fraction ([place])    a. 05/2013 Echo: EF 55%, mild LVH, diast dysfxn, Ao sclerosis, mildly dil LA, RV dysfxn (poorly visualized), PASP 109mmHg;  b. 03/2017 Echo: EF 55-60%, no rwma, triv MR, mildly dil RV, mod TR, PASP 34mmHg.  . Atrial fibrillation [facility])    s/p Cox Maze 1/09;  Multaq Rx d/c'd in 2014 due to pulmo fibrosis;  coumadin d/c'd in 2014 due to spontaneous subdural hematoma  . BPH (benign prostatic hyperplasia)   . CAD (coronary artery disease), native coronary artery    a. s/p CABG 12/2007;  b. Myoview 12/2011: EF 66%, no scar or ischemia; normal.  . DM (diabetes mellitus) ([place])   . Hyperlipidemia type II   .  Hypertension   . 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 OSA (obstructive sleep apnea)   . Pacemaker    PPM - St. Jude  . Peripheral neuropathy 07/31/2018  . 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 Subdural hematoma ([place]) 07/2012   spontaneous;  coumadin d/c'd => no longer a candidate for anticoagulation    Patient Active Problem List   Diagnosis Date Noted  . Deficiency anemia 11/19/2018  . Other fatigue 11/19/2018  . History of elevated PSA 11/19/2018  . Prostate cancer screening 11/19/2018  . Prostate CA ([place]) 11/19/2018  . Pancytopenia, acquired ([place]) 08/13/2018  . Recurrent falls 08/13/2018  . 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 Peripheral neuropathy 07/31/2018  . 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 Anemia, chronic disease 01/28/2018  . History of skin cancer 07/27/2017  . Fatty liver disease, nonalcoholic 66/59/9357  . Thrombocytopenia ([place]) 11/27/2016  . Lung nodule 04/23/2014  . Postinflammatory pulmonary fibrosis ([place]) 06/13/2013  . Long term (current) use of anticoagulants 11/22/2011  . Abdominal bruit 03/07/2011  . Pacemaker-St.Jude   . CAD (coronary artery disease), native coronary artery   . Hyperlipidemia type II   . OSA (obstructive sleep apnea)   . PACEMAKER, PERMANENT 08/17/2010  . DIABETES MELLITUS  08/16/2010  . HYPERLIPIDEMIA 08/16/2010  . Obesity 08/16/2010  . Essential hypertension 08/16/2010  . Atrial fibrillation ([place]) 08/16/2010  . IRREGULAR HEART RATE 08/16/2010  . SLEEP APNEA 08/16/2010  . BENIGN PROSTATIC HYPERTROPHY, HX OF 08/16/2010    Past Surgical History:  Procedure Laterality Date  . APPENDECTOMY    . CHOLECYSTECTOMY    . CORONARY ARTERY BYPASS GRAFT     x3 (left internal mammary artery to distal left anterior descending coronary artery, saphenous vain graft to second circumflex marginal branch, saphenous vain graft to posterior descending  coronary artery, endoscopic saphenous vain harvest from right thigh) and modified Cox - Maze IV procedure.  [name],MD. Electronically signed CHO/MEDQ D: 01/09/2008/ JOB: [id number] cc:  [name]  . [name]  07/30/2012   Procedure: CRANIOTOMY HEMATOMA EVACUATION SUBDURAL;  Surgeon: [name], MD;  Location: [place] [facility] ORS;  Service: Neurosurgery;  Laterality: Right;  Right craniotomy for evacuation of subdural hematoma  . FOOT SURGERY    . HERNIA REPAIR    . ORCHIECTOMY     Left  /  testicular cancer  . PACEMAKER PLACEMENT     PPM - St. Jude        Home Medications    Prior to Admission medications   Medication Sig Start Date End Date Taking? Authorizing Provider  allopurinol (ZYLOPRIM) 300 MG tablet Take 1 tablet by mouth daily. 06/21/16  Yes [provider]  colchicine 0.6 MG tablet Take 0.6-1.2 mg by mouth See admin instructions. Take 1.2 mg by mouth then 0.6 mg one hour later as needed for gout flares/ as directed. 05/13/15  Yes [provider]  furosemide (LASIX) 40 MG tablet Take 1 tablet (40 mg total) by mouth 2 (two) times daily. 12/12/17  Yes [name], PA  metoprolol tartrate (LOPRESSOR) 50 MG tablet TAKE 1 TABLET TWICE DAILY Patient taking differently: Take 50 mg by mouth 2 (two) times daily.  06/07/18  Yes [name], MD  oxybutynin (DITROPAN-XL) 10 MG 24 hr tablet Take 10 mg by mouth daily.  04/07/13  Yes [provider]  pantoprazole (PROTONIX) 40 MG tablet Take 40 mg by mouth daily. 02/22/17  Yes [provider]  potassium chloride SA (K-DUR,KLOR-CON) 20 MEQ tablet Take 20 mEq by mouth daily.     Yes [provider]  rosuvastatin (CRESTOR) 40 MG tablet Take 1 tablet (40 mg total) by mouth daily. DUE FOR APPT 08/20/18  Yes [name], MD  sertraline (ZOLOFT) 100 MG tablet Take 100 mg by mouth daily.   Yes [provider]  Tamsulosin HCl (FLOMAX) 0.4 MG CAPS Take 0.4 mg by mouth at bedtime.    Yes  [provider]    Family History Family History  Problem Relation Age of Onset  . Heart disease Father   . Heart attack Father   . Heart failure Father   . Heart disease Mother   . Alzheimer's disease Mother   . Dementia Mother     Social History Social History   Tobacco Use  . Smoking status: Former Smoker    Last attempt to quit: 02/21/1991    Years since quitting: 27.7  . Smokeless tobacco: Never Used  Substance Use Topics  . Alcohol use: No    Alcohol/week: 0.0 standard drinks  . Drug use: No     Allergies   Allopurinol and Enbrel [etanercept]   Review of Systems Review of Systems  All other systems reviewed and are  negative.    Physical Exam Updated Vital Signs BP 118/76   Pulse 80   Temp 97.8 F (36.6 C) (Oral)   Resp 17   Ht 5\' 8"  (1.727 m)   Wt 96 kg   SpO2 100%   BMI 32.18 kg/m   Physical Exam  Constitutional: He is oriented to person, place, and time. He appears well-developed and well-nourished.  HENT:  Head: Normocephalic and atraumatic.  Eyes: Pupils are equal, round, and reactive to light. EOM are normal.  Neck: Normal range of motion.  Cardiovascular: Normal rate, regular rhythm, normal heart sounds and intact distal pulses.  Pulmonary/Chest: Effort normal and breath sounds normal. No respiratory distress.  Abdominal: Soft. He exhibits no distension. There is no tenderness.  Musculoskeletal: Normal range of motion.  Neurological: He is alert and oriented to person, place, and time.  5/5 strength in major muscle groups of  bilateral upper and lower extremities. Speech normal. No facial asymetry.   Skin: Skin is warm and dry.  Psychiatric: He has a normal mood and affect. Judgment normal.  Nursing note and vitals reviewed.    ED Treatments / Results  Labs (all labs ordered are listed, but only abnormal results are displayed) Labs Reviewed  BASIC METABOLIC PANEL - Abnormal; Notable for the following components:      Result  Value   Creatinine, Ser 1.84 (*)    GFR calc non Af Amer 35 (*)    GFR calc Af Amer 41 (*)    All other components within normal limits  CBC - Abnormal; Notable for the following components:   WBC 3.9 (*)    RBC 3.40 (*)    Hemoglobin 9.4 (*)    HCT 31.8 (*)    MCHC 29.6 (*)    RDW 17.3 (*)    Platelets 84 (*)    All other components within normal limits  URINALYSIS, ROUTINE W REFLEX MICROSCOPIC - Abnormal; Notable for the following components:   Color, Urine STRAW (*)    All other components within normal limits  CBG MONITORING, ED    EKG EKG Interpretation  Date/Time:  Friday November 22 2018 13:05:57 EST Ventricular Rate:  95 PR Interval:    QRS Duration: 150 QT Interval:  436 QTC Calculation: 547 R Axis:   45 Text Interpretation:  atrial paced rhythm Right bundle branch block T wave abnormality, consider inferolateral ischemia Abnormal ECG No significant change was found Confirmed by Jola Schmidt [phone]) on 11/22/2018 3:46:08 PM   Radiology Ct Cervical Spine Wo Contrast  Result Date: 11/22/2018 CLINICAL DATA:  Fall last night. EXAM: CT CERVICAL SPINE WITHOUT CONTRAST TECHNIQUE: Multidetector CT imaging of the cervical spine was performed without intravenous contrast. Multiplanar CT image reconstructions were also generated. COMPARISON:  CT scan of January 24, 2013. FINDINGS: Alignment: Normal. Skull base and vertebrae: No acute fracture. No primary bone lesion or focal pathologic process. Soft tissues and spinal canal: No prevertebral fluid or swelling. No visible canal hematoma. 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 Moderate degenerative disc disease is noted at C3-4, C4-5 and C6-7 with anterior and posterior osteophyte formation. Upper chest: Negative. Other: Degenerative changes are seen involving posterior facet joints bilaterally. IMPRESSION: Multilevel spondylosis. No acute abnormality seen in the cervical spine.  Electronically Signed   By: Marijo Conception, M.D.   On: 11/22/2018 17:31   Ct Thoracic Spine Wo Contrast  Result Date: 11/22/2018 CLINICAL DATA:  Progressive ataxia with  frequent falls most recent fall last evening in the shower. Swelling of the lower extremities. EXAM: CT THORACIC SPINE WITHOUT CONTRAST TECHNIQUE: Multidetector CT images of the thoracic were obtained using the standard protocol without intravenous contrast. COMPARISON:  Lateral chest radiograph 03/19/2017 FINDINGS: Alignment: Normal. Vertebrae: No acute fracture or focal pathologic process. Paraspinal and other soft tissues: Included mediastinum demonstrates evidence of aortic atherosclerosis at the arch. No aneurysm is identified. No mediastinal lymphadenopathy is identified. The thoracic esophagus is unremarkable. Trace effusions and atelectasis are suggested the posterior aspect. Coarsened interstitial markings are visualized. No paraspinal hematoma. The included ribs appear intact. Disc levels: There is mild multilevel degenerative disc disease with vacuum disc phenomena. No focal disc herniation, canal stenosis or significant foraminal encroachment. Facets are unremarkable. IMPRESSION: 1. No acute fracture or subluxation of the thoracic spine. No significant canal stenosis. 2. Mild multilevel degenerative disc disease of the thoracic spine consistent with mild thoracic spondylosis is noted. Electronically Signed   By: Ashley Royalty M.D.   On: 11/22/2018 17:39   Ct Lumbar Spine Wo Contrast  Result Date: 11/22/2018 CLINICAL DATA:  Frequent falls.  Fell in the shower last night. EXAM: CT LUMBAR SPINE WITHOUT CONTRAST TECHNIQUE: Multidetector CT imaging of the lumbar spine was performed without intravenous contrast administration. Multiplanar CT image reconstructions were also generated. COMPARISON:  None. FINDINGS: Segmentation: 5 lumbar type vertebrae. Alignment: Normal. Vertebrae: No acute fracture or focal pathologic process. Generalized  osteopenia. Paraspinal and other soft tissues: No acute paraspinal abnormality. Left renal cyst is noted. Abdominal aortic atherosclerosis. Other: Partial ankylosis of bilateral sacroiliac joints. Disc levels: Disc spaces are relatively well maintained. Severe right facet arthropathy at L5-S1. Mild left facet arthropathy at L5-S1. Moderate bilateral foraminal stenosis at L4-5. Severe right foraminal stenosis at L5-S1. IMPRESSION: 1. No acute osseous injury of the lumbar spine. 2. Severe right facet arthropathy at L5-S1. Mild left facet arthropathy at L5-S1. 3. Severe right foraminal stenosis at L5-S1. 4. Moderate bilateral foraminal stenosis at L4-5. Electronically Signed   By: Kathreen Devoid   On: 11/22/2018 17:40    Procedures Procedures (including critical care time)  Medications Ordered in ED Medications - No data to display   Initial Impression / Assessment and Plan / ED Course  I have reviewed the triage vital signs and the nursing notes.  Pertinent labs & imaging results that were available during my care of the patient were reviewed by me and considered in my medical decision making (see chart for details).     Overall well-appearing.  No focal weakness at this time.  He has not had any advanced imaging of his back.  He denies back pain at this time.  Unable to obtain MRI secondary to pacemaker.  CT imaging of the C, T, L-spine demonstrate no obvious canal stenosis or pathology to explain symptoms.  He will need ongoing follow-up with his primary care physician with his neurologist.  No obvious hallucinations while I am in the room.  It sounds as though he is had some sleep disturbance over the past several months which likely is adding to some of this.  He may benefit from seeing a geriatrician.  Primary care and neurology follow-up.  Patient and family are encouraged to return to the ER for new or worsening symptoms.  No indication for additional work-up at this time or acute  hospitalization.  Baseline anemia.  Baseline renal insufficiency.  Known thrombocytopenia and has had an uneventful work-up by hematology.  Final Clinical Impressions(s) /  ED Diagnoses   Final diagnoses:  Gait instability    ED Discharge Orders    None       Jola Schmidt, MD 11/22/18 1800

## 2018-11-22 NOTE — Discharge Instructions (Addendum)
You MUST use your walker

## 2018-11-22 NOTE — ED Triage Notes (Signed)
Pt sts here bilateral leg swelling x weeks with slip and fall last night; pt has some neuropathy and has some weakness and tightness in legs at times

## 2018-11-26 ENCOUNTER — Ambulatory Visit (INDEPENDENT_AMBULATORY_CARE_PROVIDER_SITE_OTHER): Payer: Medicare Other | Admitting: Neurology

## 2018-11-26 ENCOUNTER — Encounter: Payer: Self-pay | Admitting: Neurology

## 2018-11-26 VITALS — BP 120/79 | HR 98 | Ht 68.0 in | Wt 208.5 lb

## 2018-11-26 DIAGNOSIS — G603 Idiopathic progressive neuropathy: Secondary | ICD-10-CM | POA: Diagnosis not present

## 2018-11-26 DIAGNOSIS — I25810 Atherosclerosis of coronary artery bypass graft(s) without angina pectoris: Secondary | ICD-10-CM

## 2018-11-26 DIAGNOSIS — R269 Unspecified abnormalities of gait and mobility: Secondary | ICD-10-CM | POA: Diagnosis not present

## 2018-11-26 DIAGNOSIS — D472 Monoclonal gammopathy: Secondary | ICD-10-CM | POA: Diagnosis not present

## 2018-11-26 DIAGNOSIS — D61818 Other pancytopenia: Secondary | ICD-10-CM | POA: Diagnosis not present

## 2018-11-26 MED ORDER — MIRABEGRON ER 50 MG PO TB24: 50.0000 mg | ORAL_TABLET | Freq: Every day | ORAL | 3 refills | Status: DC

## 2018-11-26 NOTE — Progress Notes (Signed)
Reason for visit: Memory disturbance, peripheral neuropathy, gait disturbance  [name]. is an 77 y.o. male  History of present illness:  [name] is a 77 year old white male with a history of diabetes, coronary artery disease, sleep apnea on CPAP and a history of atrial fibrillation.  The patient has developed a short-term memory issue, he is on oxybutynin for his bladder currently.  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  The patient has not yet had a bone marrow biopsy.  His anemia has worsened recently.  He has had significant issues with falling, he is just started to use a walker, he is in physical therapy for his gait.  The patient was seen in the emergency room on 22 November 2018 following a fall.  CT of the lumbar, thoracic, and cervical spine were done at that time.  The patient returns for an evaluation.  He is having hallucinations over the last 6 to 8 weeks, he may have one event a week, he may see people or animals in the room, this mainly occurs at nighttime.  He has excessive daytime drowsiness, he is on CPAP.  Past Medical History:  Diagnosis Date  . (HFpEF) heart failure with preserved ejection fraction ([place])    a. 05/2013 Echo: EF 55%, mild LVH, diast dysfxn, Ao sclerosis, mildly dil LA, RV dysfxn (poorly visualized), PASP 56mHg;  b. 03/2017 Echo: EF 55-60%, no rwma, triv MR, mildly dil RV, mod TR, PASP 463mg.  [facility]   [facility]p Cox Maze 1/09;  Multaq Rx d/c'd in 2014 due to pulmo fibrosis;  coumadin d/c'd in 2014 due to spontaneous subdural hematoma  . BPH (benign prostatic hyperplasia)   . CAD (coronary artery disease), native coronary artery    a. s/p CABG 12/2007;  b. Myoview 12/2011: EF 66%, no scar or ischemia; normal.  . DM (diabetes mellitus) ([place]  . Hyperlipidemia type II   .  Hypertension   . 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 OSA (obstructive sleep apnea)   . Pacemaker    PPM - St. Jude  . Peripheral neuropathy 07/31/2018  . [place]monary fibrosis ([place]   Multaq d/c'd 7/14  . Subdural [place]oma ([place]   spontaneous;  coumadin d/c'd => no longer a candidate for anticoagulation    Past Surgical History:  Procedure Laterality Date  . APPENDECTOMY    . CHOLECYSTECTOMY    . CORONARY ARTERY BYPASS GRAFT     x3 (left internal mammary artery to distal left anterior descending coronary artery, saphenous vain graft to second circumflex marginal branch, saphenous vain graft to posterior descending coronary artery, endoscopic saphenous vain harvest from right thigh) and modified Cox - Maze IV procedure.  ClValentina GuOwen,MD. Electronically signed CHO/MEDQ D: 01/09/2008/ JOB: [id number]:  JoIran SizerD  . CRKyla Balzarine8/13/2013   Procedure: CRANIOTOMY HEMATOMA EVACUATION SUBDURAL;  Surgeon: GaElaina HoopsMD;  [place];  Service: Neurosurgery;  Laterality: Right;  Right craniotomy for evacuation of subdural hematoma  . FOOT SURGERY    . HERNIA REPAIR    . ORCHIECTOMY     Left  /  testicular cancer  . PACEMAKER PLACEMENT     PPM - St. Jude    Family History  Problem Relation Age of  Onset  . Heart disease Father   . Heart attack Father   . Heart failure Father   . Heart disease Mother   . Alzheimer's disease Mother   . Dementia Mother     Social history:  reports that he quit smoking about 27 years ago. He has never used smokeless tobacco. He reports that he does not drink alcohol or use drugs.    Allergies  Allergen Reactions  . Allopurinol Itching    May or may not be allergic to this (If the patient should find out he is NOT allergic, please omit this entry.)  . Enbrel [Etanercept] Itching    May or may not be allergic to this (If the patient should find out he is NOT allergic, please omit this  entry.)    Medications:  Prior to Admission medications   Medication Sig Start Date End Date Taking? Authorizing Provider  allopurinol (ZYLOPRIM) 300 MG tablet Take 1 tablet by mouth daily. 06/21/16  Yes [provider]  colchicine 0.6 MG tablet Take 0.6-1.2 mg by mouth See admin instructions. Take 1.2 mg by mouth then 0.6 mg one hour later as needed for gout flares/ as directed. 05/13/15  Yes [provider]  furosemide (LASIX) 40 MG tablet Take 1 tablet (40 mg total) by mouth 2 (two) times daily. 12/12/17  Yes [name], PA  metoprolol tartrate (LOPRESSOR) 50 MG tablet TAKE 1 TABLET TWICE DAILY Patient taking differently: Take 50 mg by mouth 2 (two) times daily.  06/07/18  Yes [name], MD  oxybutynin (DITROPAN-XL) 10 MG 24 hr tablet Take 10 mg by mouth daily.  04/07/13  Yes [provider]  pantoprazole (PROTONIX) 40 MG tablet Take 40 mg by mouth daily. 02/22/17  Yes [provider]  potassium chloride SA (K-DUR,KLOR-CON) 20 MEQ tablet Take 20 mEq by mouth daily.     Yes [provider]  rosuvastatin (CRESTOR) 40 MG tablet Take 1 tablet (40 mg total) by mouth daily. DUE FOR APPT 08/20/18  Yes [name], MD  sertraline (ZOLOFT) 100 MG tablet Take 100 mg by mouth daily.   Yes [provider]  Tamsulosin HCl (FLOMAX) 0.4 MG CAPS Take 0.4 mg by mouth at bedtime.    Yes [provider]    ROS:  Out of a complete 14 system review of symptoms, the patient complains only of the following symptoms, and all other reviewed systems are negative.  Decreased activity, decreased appetite, chills, fatigue Hearing loss Light sensitivity Shortness of breath Leg swelling Cold intolerance, excessive thirst Swollen abdomen Excessive sleepiness, sleep talking, acting out dreams Incontinence of bladder, frequency of urination, urinary urgency Joint pain, joint swelling, aching muscles, muscle cramps, walking  difficulty Itching Bruising easily, anemia Memory loss, numbness, weakness Hallucinations  Blood pressure 120/79, pulse 98, height '5\' 8"'  (1.727 m), weight 208 lb 8 oz (94.6 kg).  Physical Exam  General: The patient is alert and cooperative at the time of the examination.  Skin: 1-2+ edema below the knees is seen bilaterally.   Neurologic Exam  Mental status: The patient is alert and oriented x 3 at the time of the examination. The Mini-Mental status examination done today shows a total score of 28/30.   Cranial nerves: Facial symmetry is present. Speech is normal, no aphasia or dysarthria is noted. Extraocular movements are full. Visual fields are full.  Motor: The patient has good strength in all 4 extremities.  Sensory examination: Soft touch sensation is symmetric on  the face, arms, and legs.  Coordination: The patient has good finger-nose-finger and heel-to-shin bilaterally.  Gait and station: The patient has a wide-based, unsteady gait, he uses a walker for ambulation.  Tandem gait was not attempted. Romberg is negative, but is unsteady. No drift is seen.  Reflexes: Deep tendon reflexes are symmetric, but are depressed.   CT head 07/12/18:  IMPRESSION: This CT scan of the head without contrast shows the following: 1. Brain volume is normal for age and essentially unchanged compared to 2014. 2. 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 3. Prior right craniotomy 4. No acute findings.  * CT scan images were reviewed online. I agree with the written report.    Assessment/Plan:  1.  Memory disturbance  2.  Hallucinations  3.  Severe peripheral neuropathy  4.  Gait disturbance  5.  Monoclonal antibody, IgA  6.  Myelodysplasia  The patient is followed through oncology, the significance of the monoclonal antibody may need to be further evaluated particularly in light of the myelodysplasia history.  The  patient is still on oxybutynin, we will discontinue the medication and use Myrbetriq for the bladder.  The family will call in 4 weeks, we may consider adding low-dose Aricept at this time.  The patient will follow-up in 6 months.  The patient was given a prescription for a rolling walker with a seat.  He will continue physical therapy for gait training.  Jill Alexanders MD 11/26/2018 11:03 AM  [facility] [address] [place], [place] [address]  Phone [phone] Fax [phone]

## 2018-11-26 NOTE — Patient Instructions (Signed)
Stop the Oxybutinin. We will start Myrbetriq 50 mg daily for the bladder.  Call in 4 weeks, we will consider starting a medication for memory.

## 2018-11-27 DIAGNOSIS — R296 Repeated falls: Secondary | ICD-10-CM | POA: Diagnosis not present

## 2018-11-27 DIAGNOSIS — R260 Ataxic gait: Secondary | ICD-10-CM | POA: Diagnosis not present

## 2018-11-27 DIAGNOSIS — M6281 Muscle weakness (generalized): Secondary | ICD-10-CM | POA: Diagnosis not present

## 2018-12-04 DIAGNOSIS — R400 Somnolence: Secondary | ICD-10-CM | POA: Diagnosis not present

## 2018-12-04 DIAGNOSIS — R600 Localized edema: Secondary | ICD-10-CM | POA: Diagnosis not present

## 2018-12-05 DIAGNOSIS — R3915 Urgency of urination: Secondary | ICD-10-CM | POA: Diagnosis not present

## 2018-12-05 DIAGNOSIS — R3914 Feeling of incomplete bladder emptying: Secondary | ICD-10-CM | POA: Diagnosis not present

## 2018-12-05 DIAGNOSIS — R350 Frequency of micturition: Secondary | ICD-10-CM | POA: Diagnosis not present

## 2018-12-09 ENCOUNTER — Inpatient Hospital Stay: Payer: Medicare Other

## 2018-12-16 ENCOUNTER — Telehealth: Payer: Self-pay | Admitting: Hematology and Oncology

## 2018-12-16 ENCOUNTER — Inpatient Hospital Stay: Payer: Medicare Other

## 2018-12-16 ENCOUNTER — Encounter: Payer: Self-pay | Admitting: Hematology and Oncology

## 2018-12-16 ENCOUNTER — Inpatient Hospital Stay (HOSPITAL_BASED_OUTPATIENT_CLINIC_OR_DEPARTMENT_OTHER): Payer: Medicare Other | Admitting: Hematology and Oncology

## 2018-12-16 DIAGNOSIS — C61 Malignant neoplasm of prostate: Secondary | ICD-10-CM

## 2018-12-16 DIAGNOSIS — R600 Localized edema: Secondary | ICD-10-CM

## 2018-12-16 DIAGNOSIS — R296 Repeated falls: Secondary | ICD-10-CM

## 2018-12-16 DIAGNOSIS — K760 Fatty (change of) liver, not elsewhere classified: Secondary | ICD-10-CM

## 2018-12-16 DIAGNOSIS — D61818 Other pancytopenia: Secondary | ICD-10-CM

## 2018-12-16 DIAGNOSIS — Z87898 Personal history of other specified conditions: Secondary | ICD-10-CM

## 2018-12-16 DIAGNOSIS — Z950 Presence of cardiac pacemaker: Secondary | ICD-10-CM | POA: Diagnosis not present

## 2018-12-16 DIAGNOSIS — R5383 Other fatigue: Secondary | ICD-10-CM | POA: Diagnosis not present

## 2018-12-16 DIAGNOSIS — R413 Other amnesia: Secondary | ICD-10-CM | POA: Diagnosis not present

## 2018-12-16 DIAGNOSIS — D539 Nutritional anemia, unspecified: Secondary | ICD-10-CM

## 2018-12-16 DIAGNOSIS — I2510 Atherosclerotic heart disease of native coronary artery without angina pectoris: Secondary | ICD-10-CM

## 2018-12-16 DIAGNOSIS — D472 Monoclonal gammopathy: Secondary | ICD-10-CM

## 2018-12-16 DIAGNOSIS — Z125 Encounter for screening for malignant neoplasm of prostate: Secondary | ICD-10-CM

## 2018-12-16 DIAGNOSIS — Z79899 Other long term (current) drug therapy: Secondary | ICD-10-CM

## 2018-12-16 LAB — COMPREHENSIVE METABOLIC PANEL
ALT: 18 U/L (ref 0–44)
AST: 31 U/L (ref 15–41)
Albumin: 3.9 g/dL (ref 3.5–5.0)
Alkaline Phosphatase: 99 U/L (ref 38–126)
Anion gap: 9 (ref 5–15)
BUN: 20 mg/dL (ref 8–23)
CO2: 30 mmol/L (ref 22–32)
Calcium: 9.5 mg/dL (ref 8.9–10.3)
Chloride: 103 mmol/L (ref 98–111)
Creatinine, Ser: 1.3 mg/dL — ABNORMAL HIGH (ref 0.61–1.24)
GFR calc Af Amer: >60 mL/min (ref >60)
GFR calc non Af Amer: 53 mL/min — ABNORMAL LOW (ref >60)
Glucose, Bld: 95 mg/dL (ref 70–99)
Potassium: 3.7 mmol/L (ref 3.5–5.1)
Sodium: 142 mmol/L (ref 135–145)
Total Bilirubin: 0.8 mg/dL (ref 0.3–1.2)
Total Protein: 8.5 g/dL — ABNORMAL HIGH (ref 6.5–8.1)

## 2018-12-16 LAB — CBC WITH DIFFERENTIAL/PLATELET
Abs Immature Granulocytes: 0.01 K/uL (ref 0.00–0.07)
Basophils Absolute: 0 K/uL (ref 0.0–0.1)
Basophils Relative: 1 %
Eosinophils Absolute: 0.1 K/uL (ref 0.0–0.5)
Eosinophils Relative: 3 %
HCT: 34.6 % — ABNORMAL LOW (ref 39.0–52.0)
Hemoglobin: 10.5 g/dL — ABNORMAL LOW (ref 13.0–17.0)
Immature Granulocytes: 0 %
Lymphocytes Relative: 15 %
Lymphs Abs: 0.8 K/uL (ref 0.7–4.0)
MCH: 28.3 pg (ref 26.0–34.0)
MCHC: 30.3 g/dL (ref 30.0–36.0)
MCV: 93.3 fL (ref 80.0–100.0)
Monocytes Absolute: 0.2 K/uL (ref 0.1–1.0)
Monocytes Relative: 4 %
Neutro Abs: 3.9 K/uL (ref 1.7–7.7)
Neutrophils Relative %: 77 %
Platelets: 105 K/uL — ABNORMAL LOW (ref 150–400)
RBC: 3.71 MIL/uL — ABNORMAL LOW (ref 4.22–5.81)
RDW: 16.6 % — ABNORMAL HIGH (ref 11.5–15.5)
WBC: 5 K/uL (ref 4.0–10.5)
nRBC: 0 % (ref 0.0–0.2)

## 2018-12-16 LAB — LACTATE DEHYDROGENASE: LDH: 183 U/L (ref 98–192)

## 2018-12-16 LAB — DIRECT ANTIGLOBULIN TEST (NOT AT ARMC)
DAT, IgG: NEGATIVE
DAT, complement: NEGATIVE

## 2018-12-16 LAB — VITAMIN B12: Vitamin B-12: 451 pg/mL (ref 180–914)

## 2018-12-16 LAB — SEDIMENTATION RATE: Sed Rate: 45 mm/hr — ABNORMAL HIGH (ref 0–16)

## 2018-12-16 LAB — URIC ACID: Uric Acid, Serum: 3.9 mg/dL (ref 3.7–8.6)

## 2018-12-16 NOTE — Progress Notes (Signed)
[place] OFFICE PROGRESS NOTE  Patient Care Team: [name], [name] [name], [name], [name], [name], acquired [facility]) The patient has been forgetful.  He did not undergo the blood test that I ordered last week.  I will reschedule his blood work for today and plan to see him again next week for further follow-up. We discussed the risk and benefits of bone marrow biopsy  Recurrent falls He has recurrent falls and neuropathy.  He [name] not able to describe his sensation of neuropathy but he appears in the lower extremities equally affected on both sides. He [name] undergoing physical therapy.  He was recently seen by neurologist.  CAD (coronary artery disease), native coronary artery He has history of coronary artery disease, status post pacemaker implantation. He [name] noted to have bilateral lower extremity edema with discoloration, most consistent with peripheral vascular disease. I recommend appointment to see his cardiologist for medical management  Poor memory He has poor memory.  His wife noticed some intermittent hallucination that comes and goes, most consistent with sundowning phenomenon.  I am wondering whether he has evidence of early onset dementia. I defer to his neurologist for further management   No orders of the defined types were placed in this encounter.   INTERVAL HISTORY: Please see below for problem oriented charting. He returns with his wife for further follow-up Since last time I saw him, his wife reported that the patient has poor memory, intermittent hallucinations that appears to be visual in nature especially during the nighttime and poor mobility with recurrent falls. He complained of neuropathy affecting both feet. He said that his sensation [name] reduced and it  was painful. He has occasional nonspecific cough and chills but no reported fever. He bruises easily. He was supposed to get his lab drawn last week but did not show up for his appointment He complained bilateral leg edema  SUMMARY OF ONCOLOGIC HISTORY:  [name] here because of thrombocytopenia. 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  He was found to have abnormal CBC from abnormal blood work from routine blood work monitoring. I have reviewed outside records from his primary care doctor.  He [name] noted to have mild pancytopenia since 11/17/2015. On 11/17/2015, white blood cell count 5.9, hemoglobin 12.7 and platelet count 106 On 03/13/2016, white blood cell count 6.6, hemoglobin 12.6 and platelet count 103 On 09/06/2015, white blood cell count 6.6, hemoglobin 11.9 and platelet count 102 On 10/25/2016, white count 5.2, hemoglobin 12.7 and platelet count 86 On 11/08/16, white blood cell count 5.3, hemoglobin 12.2 implant, 107. On 09/05/2016, serum ferritin was high at 220, iron study showed serum iron low at 24 and 9% iron saturation.  Some older scanned records dated back to 12/13/2011 showed low platelet count of 101 On 12/21/2011, he had normal platelet count of 210 On 07/30/2012, his platelet count was borderline low at 143  On 09/08/2016, CT scan of the abdomen and pelvis showed nodular hepatic contour suspicious for possible liver cirrhosis. He [name] noted to have splenomegaly although this was not reported on the CT (by my review) He denies bleeding, such as spontaneous epistaxis, hematuria, melena or hematochezia. He does have easy bruising The patient had history of a hematoma  and [name] no longer on chronic anticoagulation therapy He [name] known to have fatty liver disease from prior imaging studies He denies prior blood or platelet transfusions; however, based on his prior  extensive surgical history, he probably had transfusion support in the past Overall impression [name] fatty liver disease secondary to poorly controlled diabetes and morbid obesity as a cause of his thrombocytopenia.  He [name] being observed Repeat CT imaging of the abdomen and pelvis in August 2019 confirmed liver cirrhosis and splenomegaly  REVIEW OF SYSTEMS:   Eyes: Denies blurriness of vision Ears, nose, mouth, throat, and face: Denies mucositis or sore throat Cardiovascular: Denies palpitation, chest discomfort  Gastrointestinal:  Denies nausea, heartburn or change in bowel habits Skin: Denies abnormal skin rashes Lymphatics: Denies new lymphadenopathy or easy bruising Behavioral/Psych: Mood [name] stable, no new changes  All other systems were reviewed with the patient and are negative.  I have reviewed the past medical history, past surgical history, social history and family history with the patient and they are unchanged from previous note.  ALLERGIES:  [name] allergic to allopurinol and enbrel [etanercept].  MEDICATIONS:  Current Outpatient Medications  Medication Sig Dispense Refill  . allopurinol (ZYLOPRIM) 300 MG tablet Take 1 tablet by mouth daily.    . colchicine 0.6 MG tablet Take 0.6-1.2 mg by mouth See admin instructions. Take 1.2 mg by mouth then 0.6 mg one hour later as needed for gout flares/ as directed.  1  . furosemide (LASIX) 40 MG tablet Take 1 tablet (40 mg total) by mouth 2 (two) times daily. 60 tablet 3  . metoprolol tartrate (LOPRESSOR) 50 MG tablet TAKE 1 TABLET TWICE DAILY (Patient taking differently: Take 50 mg by mouth 2 (two) times daily. ) 180 tablet 3  . mirabegron ER (MYRBETRIQ) 50 MG TB24 tablet Take 1 tablet (50 mg total) by mouth daily. 30 tablet 3  . pantoprazole (PROTONIX) 40 MG tablet Take 40 mg by mouth daily.    . potassium chloride SA (K-DUR,KLOR-CON) 20 MEQ tablet Take 20 mEq by mouth daily.      . rosuvastatin (CRESTOR) 40 MG tablet Take 1 tablet (40 mg  total) by mouth daily. DUE FOR APPT 90 tablet 2  . sertraline (ZOLOFT) 100 MG tablet Take 100 mg by mouth daily.    . Tamsulosin HCl (FLOMAX) 0.4 MG CAPS Take 0.4 mg by mouth at bedtime.      No current facility-administered medications for this visit.     PHYSICAL EXAMINATION: ECOG PERFORMANCE STATUS: 2 - Symptomatic, <50% confined to bed  Vitals:   12/16/18 1321  BP: (!) 144/49  Pulse: 76  Resp: 18  Temp: 97.7 F (36.5 C)  SpO2: 99%   Filed Weights   12/16/18 1321  Weight: 203 lb (92.1 kg)    GENERAL:alert, no distress and comfortable.  He looks overweight SKIN: With significant skin bruises EYES: normal, Conjunctiva are pink and non-injected, sclera clear OROPHARYNX:no exudate, no erythema and lips, buccal mucosa, and tongue normal  NECK: supple, thyroid normal size, non-tender, without nodularity LYMPH:  no palpable lymphadenopathy in the cervical, axillary or inguinal LUNGS: clear to auscultation and percussion with normal breathing effort HEART: regular rate & rhythm and no murmurs with moderate bilateral lower extremity edema.  Significant venous stasis dermatitis are noted ABDOMEN:abdomen soft, non-tender and normal bowel sounds Musculoskeletal:no cyanosis of digits and no clubbing  NEURO: alert & oriented x 3 with fluent speech, no focal motor/sensory deficits  LABORATORY DATA:  I have reviewed the  data as listed    Component Value Date/Time   NA 140 11/22/2018 1314   NA 143 04/23/2018 0952   NA 141 02/23/2017 1237   K 3.7 11/22/2018 1314   K 3.8 02/23/2017 1237   CL 106 11/22/2018 1314   CO2 24 11/22/2018 1314   CO2 27 02/23/2017 1237   GLUCOSE 99 11/22/2018 1314   GLUCOSE 113 02/23/2017 1237   BUN 23 11/22/2018 1314   BUN 20 04/23/2018 0952   BUN 18.3 02/23/2017 1237   CREATININE 1.84 (H) 11/22/2018 1314   CREATININE 1.2 02/23/2017 1237   CALCIUM 9.2 11/22/2018 1314   CALCIUM 9.6 02/23/2017 1237   PROT 8.0 08/12/2018 1152   PROT 7.3 06/18/2018 1205    PROT 8.0 02/23/2017 1237   ALBUMIN 3.8 08/12/2018 1152   ALBUMIN 4.1 09/24/2017 0824   ALBUMIN 4.1 02/23/2017 1237   AST 25 08/12/2018 1152   AST 24 02/23/2017 1237   ALT 16 08/12/2018 1152   ALT 15 02/23/2017 1237   ALKPHOS 101 08/12/2018 1152   ALKPHOS 105 02/23/2017 1237   BILITOT 0.7 08/12/2018 1152   BILITOT 0.6 09/24/2017 0824   BILITOT 0.98 02/23/2017 1237   GFRNONAA 35 (L) 11/22/2018 1314   GFRAA 41 (L) 11/22/2018 1314    No results found for: SPEP, UPEP  Lab Results  Component Value Date   WBC 3.9 (L) 11/22/2018   NEUTROABS 2.3 11/19/2018   HGB 9.4 (L) 11/22/2018   HCT 31.8 (L) 11/22/2018   MCV 93.5 11/22/2018   PLT 84 (L) 11/22/2018      Chemistry      Component Value Date/Time   NA 140 11/22/2018 1314   NA 143 04/23/2018 0952   NA 141 02/23/2017 1237   K 3.7 11/22/2018 1314   K 3.8 02/23/2017 1237   CL 106 11/22/2018 1314   CO2 24 11/22/2018 1314   CO2 27 02/23/2017 1237   BUN 23 11/22/2018 1314   BUN 20 04/23/2018 0952   BUN 18.3 02/23/2017 1237   CREATININE 1.84 (H) 11/22/2018 1314   CREATININE 1.2 02/23/2017 1237      Component Value Date/Time   CALCIUM 9.2 11/22/2018 1314   CALCIUM 9.6 02/23/2017 1237   ALKPHOS 101 08/12/2018 1152   ALKPHOS 105 02/23/2017 1237   AST 25 08/12/2018 1152   AST 24 02/23/2017 1237   ALT 16 08/12/2018 1152   ALT 15 02/23/2017 1237   BILITOT 0.7 08/12/2018 1152   BILITOT 0.6 09/24/2017 0824   BILITOT 0.98 02/23/2017 1237       RADIOGRAPHIC STUDIES: I have personally reviewed the radiological images as listed and agreed with the findings in the report. Ct Cervical Spine Wo Contrast  Result Date: 11/22/2018 CLINICAL DATA:  Fall last night. EXAM: CT CERVICAL SPINE WITHOUT CONTRAST TECHNIQUE: Multidetector CT imaging of the cervical spine was performed without intravenous contrast. Multiplanar CT image reconstructions were also generated. COMPARISON:  CT scan of January 24, 2013. FINDINGS: Alignment: Normal.  Skull base and vertebrae: No acute fracture. No primary bone lesion or focal pathologic process. Soft tissues and spinal canal: No prevertebral fluid or swelling. No visible canal hematoma. 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 Moderate degenerative disc disease [name] noted at C3-4, C4-5 and C6-7 with anterior and posterior osteophyte formation. Upper chest: Negative. Other: Degenerative changes are seen involving posterior facet joints bilaterally. IMPRESSION: Multilevel spondylosis. No acute abnormality seen in the cervical spine. Electronically Signed  By: Marijo Conception, M.D.   On: 11/22/2018 17:31   Ct Thoracic Spine Wo Contrast  Result Date: 11/22/2018 CLINICAL DATA:  Progressive ataxia with frequent falls most recent fall last evening in the shower. Swelling of the lower extremities. EXAM: CT THORACIC SPINE WITHOUT CONTRAST TECHNIQUE: Multidetector CT images of the thoracic were obtained using the standard protocol without intravenous contrast. COMPARISON:  Lateral chest radiograph 03/19/2017 FINDINGS: Alignment: Normal. Vertebrae: No acute fracture or focal pathologic process. Paraspinal and other soft tissues: Included mediastinum demonstrates evidence of aortic atherosclerosis at the arch. No aneurysm [name] identified. No mediastinal lymphadenopathy [name] identified. The thoracic esophagus [name] unremarkable. Trace effusions and atelectasis are suggested the posterior aspect. Coarsened interstitial markings are visualized. No paraspinal hematoma. The included ribs appear intact. Disc levels: There [name] mild multilevel degenerative disc disease with vacuum disc phenomena. No focal disc herniation, canal stenosis or significant foraminal encroachment. Facets are unremarkable. IMPRESSION: 1. No acute fracture or subluxation of the thoracic spine. No significant canal stenosis. 2. Mild multilevel degenerative disc disease of the thoracic spine consistent with mild thoracic  spondylosis [name] noted. Electronically Signed   By: Ashley Royalty M.D.   On: 11/22/2018 17:39   Ct Lumbar Spine Wo Contrast  Result Date: 11/22/2018 CLINICAL DATA:  Frequent falls.  Fell in the shower last night. EXAM: CT LUMBAR SPINE WITHOUT CONTRAST TECHNIQUE: Multidetector CT imaging of the lumbar spine was performed without intravenous contrast administration. Multiplanar CT image reconstructions were also generated. COMPARISON:  None. FINDINGS: Segmentation: 5 lumbar type vertebrae. Alignment: Normal. Vertebrae: No acute fracture or focal pathologic process. Generalized osteopenia. Paraspinal and other soft tissues: No acute paraspinal abnormality. Left renal cyst [name] noted. Abdominal aortic atherosclerosis. Other: Partial ankylosis of bilateral sacroiliac joints. Disc levels: Disc spaces are relatively well maintained. Severe right facet arthropathy at L5-S1. Mild left facet arthropathy at L5-S1. Moderate bilateral foraminal stenosis at L4-5. Severe right foraminal stenosis at L5-S1. IMPRESSION: 1. No acute osseous injury of the lumbar spine. 2. Severe right facet arthropathy at L5-S1. Mild left facet arthropathy at L5-S1. 3. Severe right foraminal stenosis at L5-S1. 4. Moderate bilateral foraminal stenosis at L4-5. Electronically Signed   By: Kathreen Devoid   On: 11/22/2018 17:40    All questions were answered. The patient knows to call the clinic with any problems, questions or concerns. No barriers to learning was detected.  I spent 20 minutes counseling the patient face to face. The total time spent in the appointment was 25 minutes and more than 50% was on counseling and review of test results  [name], MD 12/16/2018 2:56 PM

## 2018-12-16 NOTE — Telephone Encounter (Signed)
Gave avs and calendar ° °

## 2018-12-16 NOTE — Assessment & Plan Note (Signed)
He has history of coronary artery disease, status post pacemaker implantation. He is noted to have bilateral lower extremity edema with discoloration, most consistent with peripheral vascular disease. I recommend appointment to see his cardiologist for medical management

## 2018-12-16 NOTE — Assessment & Plan Note (Signed)
He has recurrent falls and neuropathy.  He is not able to describe his sensation of neuropathy but he appears in the lower extremities equally affected on both sides. He is undergoing physical therapy.  He was recently seen by neurologist.

## 2018-12-16 NOTE — Assessment & Plan Note (Signed)
He has poor memory.  His wife noticed some intermittent hallucination that comes and goes, most consistent with sundowning phenomenon.  I am wondering whether he has evidence of early onset dementia. I defer to his neurologist for further management

## 2018-12-16 NOTE — Assessment & Plan Note (Signed)
The patient has been forgetful.  He did not undergo the blood test that I ordered last week.  I will reschedule his blood work for today and plan to see him again next week for further follow-up. We discussed the risk and benefits of bone marrow biopsy

## 2018-12-17 LAB — MULTIPLE MYELOMA PANEL, SERUM
Albumin SerPl Elph-Mcnc: 3.6 g/dL (ref 2.9–4.4)
Albumin/Glob SerPl: 1 (ref 0.7–1.7)
Alpha 1: 0.3 g/dL (ref 0.0–0.4)
Alpha2 Glob SerPl Elph-Mcnc: 0.8 g/dL (ref 0.4–1.0)
B-Globulin SerPl Elph-Mcnc: 1.4 g/dL — ABNORMAL HIGH (ref 0.7–1.3)
Gamma Glob SerPl Elph-Mcnc: 1.5 g/dL (ref 0.4–1.8)
Globulin, Total: 4 g/dL — ABNORMAL HIGH (ref 2.2–3.9)
IgA: 738 mg/dL — ABNORMAL HIGH (ref 61–437)
IgG (Immunoglobin G), Serum: 1548 mg/dL (ref 700–1600)
IgM (Immunoglobulin M), Srm: 104 mg/dL (ref 15–143)
M Protein SerPl Elph-Mcnc: 0.2 g/dL — ABNORMAL HIGH
Total Protein ELP: 7.6 g/dL (ref 6.0–8.5)

## 2018-12-17 LAB — ERYTHROPOIETIN: Erythropoietin: 25.2 m[IU]/mL — ABNORMAL HIGH (ref 2.6–18.5)

## 2018-12-17 LAB — PSA, TOTAL AND FREE
PSA, Free Pct: 27.5 %
PSA, Free: 0.11 ng/mL
Prostate Specific Ag, Serum: 0.4 ng/mL (ref 0.0–4.0)

## 2018-12-17 LAB — IRON AND TIBC
Iron: 36 ug/dL — ABNORMAL LOW (ref 42–163)
Saturation Ratios: 9 % — ABNORMAL LOW (ref 20–55)
TIBC: 384 ug/dL (ref 202–409)
UIBC: 348 ug/dL (ref 117–376)

## 2018-12-17 LAB — FOLATE RBC
Folate, Hemolysate: 424 ng/mL
Folate, RBC: 1269 ng/mL (ref >498)
Hematocrit: 33.4 % — ABNORMAL LOW (ref 37.5–51.0)

## 2018-12-17 LAB — HAPTOGLOBIN: Haptoglobin: 102 mg/dL (ref 34–355)

## 2018-12-17 LAB — FERRITIN: Ferritin: 65 ng/mL (ref 24–336)

## 2018-12-17 LAB — KAPPA/LAMBDA LIGHT CHAINS
Kappa free light chain: 71.4 mg/L — ABNORMAL HIGH (ref 3.3–19.4)
Kappa, lambda light chain ratio: 2.08 — ABNORMAL HIGH (ref 0.26–1.65)
Lambda free light chains: 34.3 mg/L — ABNORMAL HIGH (ref 5.7–26.3)

## 2018-12-17 LAB — BETA 2 MICROGLOBULIN, SERUM: Beta-2 Microglobulin: 4.8 mg/L — ABNORMAL HIGH (ref 0.6–2.4)

## 2018-12-17 LAB — TSH: TSH: 2.385 u[IU]/mL (ref 0.320–4.118)

## 2018-12-19 ENCOUNTER — Encounter: Payer: Self-pay | Admitting: Cardiology

## 2018-12-19 ENCOUNTER — Ambulatory Visit (INDEPENDENT_AMBULATORY_CARE_PROVIDER_SITE_OTHER): Payer: Medicare Other | Admitting: Cardiology

## 2018-12-19 VITALS — BP 132/66 | HR 71 | Ht 68.0 in | Wt 199.0 lb

## 2018-12-19 DIAGNOSIS — R600 Localized edema: Secondary | ICD-10-CM

## 2018-12-19 DIAGNOSIS — I2510 Atherosclerotic heart disease of native coronary artery without angina pectoris: Secondary | ICD-10-CM | POA: Diagnosis not present

## 2018-12-19 DIAGNOSIS — I5033 Acute on chronic diastolic (congestive) heart failure: Secondary | ICD-10-CM | POA: Diagnosis not present

## 2018-12-19 DIAGNOSIS — I10 Essential (primary) hypertension: Secondary | ICD-10-CM

## 2018-12-19 DIAGNOSIS — E7800 Pure hypercholesterolemia, unspecified: Secondary | ICD-10-CM | POA: Diagnosis not present

## 2018-12-19 MED ORDER — FUROSEMIDE 40 MG PO TABS: 60.0000 mg | ORAL_TABLET | Freq: Two times a day (BID) | ORAL | 3 refills | Status: DC

## 2018-12-19 NOTE — Progress Notes (Signed)
HPI: FU CAD and atrial fibrillation. He underwent CABG (LIMA-LAD, SVG-OM 2, SVG-PDA) along with modified Cox Maze IV procedure in 12/2007. 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 Abdominal US 3/12: No aneurysm. 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 He underwent craniotomy and evacuation by Dr. Saintclair Halsted. He was taken off of Coumadin and no longer felt to be an anticoagulation candidate.Multaq DCed previously as felt causing pulmonary toxixity. Nuclear study 5/18 showed EF 59 with normal perfusion.  Echocardiogram December 2018 showed normal LV function, mild left ventricular hypertrophy, mild left atrial enlargement, mild to moderate tricuspid regurgitation and mild pulmonary hypertension.  Carotid Dopplers January 2019 showed 1 to 39% bilateral stenosis.  There was greater than 50% right external carotid artery stenosis.    Patient presently being evaluated for possible dementia and also pancytopenia and monoclonal antibody IgA.  Since last seen,patient has dyspnea on exertion but no orthopnea or PND.  He has had worsening bilateral lower extremity edema.  His Lasix was increased by primary care 2 weeks ago and this is improving.  He denies chest pain or syncope.  He is having difficulties with recurrent falls that he states is related to losing his balance.  Current Outpatient Medications  Medication Sig Dispense Refill  . allopurinol (ZYLOPRIM) 300 MG tablet Take 1 tablet by mouth daily.    . colchicine 0.6 MG tablet Take 0.6-1.2 mg by mouth See admin instructions. Take 1.2 mg by mouth then 0.6 mg one hour later as needed for gout flares/ as directed.  1  . furosemide (LASIX) 40 MG tablet Take 1 tablet (40 mg total) by mouth 2 (two) times daily. 60 tablet 3  . metoprolol tartrate (LOPRESSOR) 50 MG tablet TAKE 1 TABLET TWICE DAILY (Patient taking differently: Take 50 mg by mouth 2 (two) times daily. ) 180 tablet 3  .  mirabegron ER (MYRBETRIQ) 50 MG TB24 tablet Take 1 tablet (50 mg total) by mouth daily. 30 tablet 3  . pantoprazole (PROTONIX) 40 MG tablet Take 40 mg by mouth daily.    . potassium chloride SA (K-DUR,KLOR-CON) 20 MEQ tablet Take 20 mEq by mouth daily.      . rosuvastatin (CRESTOR) 40 MG tablet Take 1 tablet (40 mg total) by mouth daily. DUE FOR APPT 90 tablet 2  . sertraline (ZOLOFT) 100 MG tablet Take 100 mg by mouth daily.    . Tamsulosin HCl (FLOMAX) 0.4 MG CAPS Take 0.4 mg by mouth at bedtime.      No current facility-administered medications for this visit.      Past Medical History:  Diagnosis Date  . (HFpEF) heart failure with preserved ejection fraction ([place])    a. 05/2013 Echo: EF 55%, mild LVH, diast dysfxn, Ao sclerosis, mildly dil LA, RV dysfxn (poorly visualized), PASP 65mmHg;  b. 03/2017 Echo: EF 55-60%, no rwma, triv MR, mildly dil RV, mod TR, PASP 62mmHg.  . Atrial fibrillation [facility])    s/p Cox Maze 1/09;  Multaq Rx d/c'd in 2014 due to pulmo fibrosis;  coumadin d/c'd in 2014 due to spontaneous subdural hematoma  . BPH (benign prostatic hyperplasia)   . CAD (coronary artery disease), native coronary artery    a. s/p CABG 12/2007;  b. Myoview 12/2011: EF 66%, no scar or ischemia; normal.  . DM (diabetes mellitus) ([place])   . Hyperlipidemia type II   . Hypertension   . 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  OSA (obstructive sleep apnea)   . Pacemaker    PPM - St[employer] Jude  . Peripheral neuropathy 07/31/2018  . Pulmonary fibrosis ([place])    Multaq d/c'd 7/14  . Subdural hematoma ([place]) 07/2012   spontaneous;  coumadin d/c'd => no longer a candidate for anticoagulation    Past Surgical History:  Procedure Laterality Date  . APPENDECTOMY    . CHOLECYSTECTOMY    . CORONARY ARTERY BYPASS GRAFT     x3 (left internal mammary artery to distal left anterior descending coronary artery, saphenous vain graft to second circumflex marginal branch, saphenous  vain graft to posterior descending coronary artery, endoscopic saphenous vain harvest from right thigh) and modified Cox - Maze IV procedure.  [name],MD. Electronically signed CHO/MEDQ D: 01/09/2008/ JOB: [id number] cc:  [name]  . [name]  07/30/2012   Procedure: CRANIOTOMY HEMATOMA EVACUATION SUBDURAL;  Surgeon: [name], MD;  Location: [facility];  Service: Neurosurgery;  Laterality: Right;  Right craniotomy for evacuation of subdural hematoma  . FOOT SURGERY    . HERNIA REPAIR    . ORCHIECTOMY     Left  /  testicular cancer  . PACEMAKER PLACEMENT     PPM - St. Jude    Social History   Socioeconomic History  . Marital status: Married    Spouse name: [name]  . Number of children: 2  . Years of education: Not on file  . Highest education level: Not on file  Occupational History  . Occupation: Retired- IT trainer  Social Needs  . Financial resource strain: Not on file  . Food insecurity:    Worry: Not on file    Inability: Not on file  . Transportation needs:    Medical: Not on file    Non-medical: Not on file  Tobacco Use  . Smoking status: Former Smoker    Last attempt to quit: 02/21/1991    Years since quitting: 27.8  . Smokeless tobacco: Never Used  Substance and Sexual Activity  . Alcohol use: No    Alcohol/week: 0.0 standard drinks  . Drug use: No  . Sexual activity: Not Currently  Lifestyle  . Physical activity:    Days per week: Not on file    Minutes per session: Not on file  . Stress: Not on file  Relationships  . Social connections:    Talks on phone: Not on file    Gets together: Not on file    Attends religious service: Not on file    Active member of club or organization: Not on file    Attends meetings of clubs or organizations: Not on file    Relationship status: Not on file  . Intimate partner violence:    Fear of current or ex partner: Not on file    Emotionally abused: Not on file    Physically abused: Not on file     Forced sexual activity: Not on file  Other Topics Concern  . Not on file  Social History Narrative   Lives with wife   Right handed    Married with two children.     He is a Engineer, structural.      Family History  Problem Relation Age of Onset  . Heart disease Father   . Heart attack Father   . Heart failure Father   . Heart disease Mother   . Alzheimer's disease Mother   . Dementia Mother     ROS: Recurrent falls but  no fevers or chills, productive cough, hemoptysis, dysphasia, odynophagia, melena, hematochezia, dysuria, hematuria, rash, seizure activity, orthopnea, PND, claudication. Remaining systems are negative.  Physical Exam: Well-developed well-nourished in no acute distress.  Skin is warm and dry.  HEENT is normal.  Neck is supple.  Chest is clear to auscultation with normal expansion.  Cardiovascular exam is regular rate and rhythm; 2/6 systolic murmur Abdominal exam nontender or distended. No masses palpated. Extremities show 1+ edema; chronic skin changes. neuro grossly intact  ECG-atrial paced, right bundle branch block, inferolateral T wave inversion.  Personally reviewed  A/P  1 paroxysmal atrial fibrillation-patient remains in atrial paced rhythm.  He is not on anticoagulation given history of spontaneous intracranial hemorrhage.  Continue beta-blocker for rate control if atrial fibrillation recurs.  Note multaq in the past was felt to be contributing potentially to pulmonary fibrosis and was discontinued.  2 coronary artery disease-he is not having chest pain.  Continue statin.  No aspirin given history of spontaneous intracranial hemorrhage.  3 hypertension-patient's blood pressure is controlled.  Continue present medications and follow.  4 hyperlipidemia-continue statin.  5 chronic diastolic congestive heart failure-patient with increased lower extremity edema recently.  This is improving with higher dose Lasix but not resolved.  Increase Lasix to 60 mg  twice daily.  Check potassium and renal function in 1 week.  I discussed fluid restriction and low-sodium diet.  Repeat echocardiogram.  6 prior pacemaker-followed by electrophysiology.  Kirk Ruths, MD

## 2018-12-19 NOTE — Patient Instructions (Signed)
Medication Instructions:  INCREASE FUROSEMIDE TO 60 MG TWICE DAILY= 1 AND 1/2 OF THE 40 MG TABLETS TWICE DAILY If you need a refill on your cardiac medications before your next appointment, please call your pharmacy.   Lab work: Your physician recommends that you return for lab work in: [place] If you have labs (blood work) drawn today and your tests are completely normal, you will receive your results only by: [name] MyChart Message (if you have MyChart) OR . A paper copy in the mail If you have any lab test that is abnormal or we need to change your treatment, we will call you to review the results.  Testing/Procedures: Your physician has requested that you have an echocardiogram. Echocardiography is a painless test that uses sound waves to create images of your heart. It provides your doctor with information about the size and shape of your heart and how well your heart's chambers and valves are working. This procedure takes approximately one hour. There are no restrictions for this procedure.  [place]  Follow-Up: At [facility], you and your health needs are our priority.  As part of our continuing mission to provide you with exceptional heart care, we have created designated Provider Care Teams.  These Care Teams include your primary Cardiologist (physician) and Advanced Practice Providers (APPs -  Physician Assistants and Nurse Practitioners) who all work together to provide you with the care you need, when you need it.  Your physician recommends that you schedule a follow-up appointment in: 3 MONTHS WITH DR CRENSHAW  Fluid Restriction With some health conditions, you must restrict your fluid intake. This means that you need to limit the amount of fluid that you drink each day (fluid restriction). When you have a fluid restriction, you must carefully measure and keep track of the amount of fluid that you drink. Your health care provider will identify the specific amount of  fluid you are allowed each day (fluid allowance). This amount may depend on several things, such as:  How well your kidneys function.  How much fluid you are keeping (retaining) in your body tissues.  Your blood pressure.  Your heart function.  Your blood sodium level. What is my plan? Your health care provider recommends that you limit your fluid intake to __________ per day. What counts toward my fluid intake? Your fluid intake includes all liquids that you drink, as well as any foods that become liquid at room temperature. The following are examples of some fluids that you will have to restrict:  Tea, coffee, soda, lemonade, milk, water, juice, sports drinks, and nutritional supplement beverages.  Alcoholic beverages.  Cream.  Gravy.  Ice cubes.  Soup and broth. The following are examples of foods that become liquid at room temperature. These foods will also count toward your fluid intake.  Ice cream and ice milk.  Frozen yogurt and sherbet.  Frozen ice pops.  Flavored gelatin. How do I keep track of my fluid intake? Each morning, fill a jug with the amount of water that is equal to your daily fluid allowance. You can use this water as a guideline for fluid allowance. Each time you take in any form of fluid (including ice cubes and foods that become liquid at room temperature), pour an equal amount of water out of the container. This helps you to see how much fluid you are taking in. It also helps you to see how much more fluid you can take in during the  rest of the day. The following conversions may also be helpful in measuring your fluid intake:  1 cup equals 8 oz (240 mL).   cup equals 6 oz (180 mL).  ? cup equals 5? oz (160 mL).   cup equals 4 oz (120 mL).  ? cup equals 2? oz (80 mL).   cup equals 2 oz (60 mL).  2 Tbsp equals 1 oz (30 mL). What are tips for following this plan? General instructions  Make sure that you stay within your recommended  fluid allowance each day. Always measure and keep track of your fluids (including ice cubes and foods that become liquid at room temperature).  Use small cups and glasses and learn to sip fluids slowly.  Try frozen fruits between meals, such as grapes or strawberries. These can satisfy thirst without adding to your fluid intake.  Swallow your pills along with meals or soft foods such as applesauce or mashed potatoes, instead of with liquids. Doing this helps you to save your fluid allowance for something that you enjoy. Weigh yourself each day     Weigh yourself every day. Keeping track of your daily weight can help you and your health care provider to notice as soon as possible if you are retaining too much fluid in your body.  Follow this sequence every morning: 1. Urinate. 2. Weigh yourself. 3. Eat breakfast.  Wear the same amount of clothing each time you weigh yourself.  Write down your daily weight. Give this weight record to your health care provider. If your weight is going up, you may be retaining too much fluid. Every 1 lb (0.45 kg) of body weight that you gain is a sign that your body is retaining 2 cups (480 mL) of fluid.  Manage your thirst  Add lemon juice or a slice of fresh lemon to water or ice. Doing this helps to satisfy your thirst.  Freeze fruit juice or water in an ice cube tray. Use this as part of your fluid allowance. These cubes are useful for quenching your thirst. Before you freeze the juice or water, measure how much liquid you use to fill a cube section of the ice tray. Subtract this amount from your day's allowance each time you consume a frozen cube.  Avoid salty (high-sodium) foods. These foods make you thirsty and make it more difficult to stay within your daily fluid allowance.  Keep the temperature in your home at a cooler level.  Keep the air in your home as humid as possible. Dry air increases thirst.  Avoid being out in the hot sun, which can  cause you to sweat and become thirsty.  To help avoid dry mouth, brush your teeth often or rinse out your mouth with mouthwash. Lemon wedges, hard sour candies, chewing gum, or breath spray may also help to moisten your mouth. What are some signs that I may be taking in too much fluid? You may be taking in too much fluid if:  Your weight increases. Contact your health care provider if you gain weight rapidly.  Your face, hands, legs, feet, and abdomen start to swell.  You have trouble breathing. Summary  With some health conditions, you must limit (restrict) your fluid intake. This means that you need to limit the amount of fluid you drink each day (fluid restriction). Your health care provider will identify the specific amount of fluid that you are allowed each day.  When you have a fluid restriction, you must carefully measure  and keep track of the amount of fluid that you drink.  Your fluid intake includes all liquids that you drink, as well as any foods that become liquid at room temperature (such as ice cream and gelatin).  You may be taking in too much fluid if your weight increases, your body starts to swell, or you have trouble breathing. This information is not intended to replace advice given to you by your health care provider. Make sure you discuss any questions you have with your health care provider. Document Released: 10/01/2007 Document Revised: 08/08/2017 Document Reviewed: 08/08/2017 Elsevier Interactive Patient Education  2019 [employer].   Low-Sodium Eating Plan Sodium, which is an element that makes up salt, helps you maintain a healthy balance of fluids in your body. Too much sodium can increase your blood pressure and cause fluid and waste to be held in your body. Your health care provider or dietitian may recommend following this plan if you have high blood pressure (hypertension), kidney disease, liver disease, or heart failure. Eating less sodium can help lower  your blood pressure, reduce swelling, and protect your heart, liver, and kidneys. What are tips for following this plan? General guidelines  Most people on this plan should limit their sodium intake to 1,500-2,000 mg (milligrams) of sodium each day. Reading food labels   The Nutrition Facts label lists the amount of sodium in one serving of the food. If you eat more than one serving, you must multiply the listed amount of sodium by the number of servings.  Choose foods with less than 140 mg of sodium per serving.  Avoid foods with 300 mg of sodium or more per serving. Shopping  Look for lower-sodium products, often labeled as "low-sodium" or "no salt added."  Always check the sodium content even if foods are labeled as "unsalted" or "no salt added".  Buy fresh foods. ? Avoid canned foods and premade or frozen meals. ? Avoid canned, cured, or processed meats  Buy breads that have less than 80 mg of sodium per slice. Cooking  Eat more home-cooked food and less restaurant, buffet, and fast food.  Avoid adding salt when cooking. Use salt-free seasonings or herbs instead of table salt or sea salt. Check with your health care provider or pharmacist before using salt substitutes.  Cook with plant-based oils, such as canola, sunflower, or olive oil. Meal planning  When eating at a restaurant, ask that your food be prepared with less salt or no salt, if possible.  Avoid foods that contain MSG (monosodium glutamate). MSG is sometimes added to [place] food, bouillon, and some canned foods. What foods are recommended? The items listed may not be a complete list. Talk with your dietitian about what dietary choices are best for you. Grains Low-sodium cereals, including oats, puffed wheat and rice, and shredded wheat. Low-sodium crackers. Unsalted rice. Unsalted pasta. Low-sodium bread. Whole-grain breads and whole-grain pasta. Vegetables Fresh or frozen vegetables. "No salt added" canned  vegetables. "No salt added" tomato sauce and paste. Low-sodium or reduced-sodium tomato and vegetable juice. Fruits Fresh, frozen, or canned fruit. Fruit juice. Meats and other protein foods Fresh or frozen (no salt added) meat, poultry, seafood, and fish. Low-sodium canned tuna and salmon. Unsalted nuts. Dried peas, beans, and lentils without added salt. Unsalted canned beans. Eggs. Unsalted nut butters. Dairy Milk. Soy milk. Cheese that is naturally low in sodium, such as ricotta cheese, fresh mozzarella, or Swiss cheese Low-sodium or reduced-sodium cheese. Cream cheese. Yogurt. Fats and oils Unsalted  butter. Unsalted margarine with no trans fat. Vegetable oils such as canola or olive oils. Seasonings and other foods Fresh and dried herbs and spices. Salt-free seasonings. Low-sodium mustard and ketchup. Sodium-free salad dressing. Sodium-free light mayonnaise. Fresh or refrigerated horseradish. Lemon juice. Vinegar. Homemade, reduced-sodium, or low-sodium soups. Unsalted popcorn and pretzels. Low-salt or salt-free chips. What foods are not recommended? The items listed may not be a complete list. Talk with your dietitian about what dietary choices are best for you. Grains Instant hot cereals. Bread stuffing, pancake, and biscuit mixes. Croutons. Seasoned rice or pasta mixes. Noodle soup cups. Boxed or frozen macaroni and cheese. Regular salted crackers. Self-rising flour. Vegetables Sauerkraut, pickled vegetables, and relishes. Olives. [place] fries. Onion rings. Regular canned vegetables (not low-sodium or reduced-sodium). Regular canned tomato sauce and paste (not low-sodium or reduced-sodium). Regular tomato and vegetable juice (not low-sodium or reduced-sodium). Frozen vegetables in sauces. Meats and other protein foods Meat or fish that is salted, canned, smoked, spiced, or pickled. Bacon, ham, sausage, hotdogs, corned beef, chipped beef, packaged lunch meats, salt pork, jerky, pickled  herring, anchovies, regular canned tuna, sardines, salted nuts. Dairy Processed cheese and cheese spreads. Cheese curds. Blue cheese. Feta cheese. String cheese. Regular cottage cheese. Buttermilk. Canned milk. Fats and oils Salted butter. Regular margarine. Ghee. Bacon fat. Seasonings and other foods Onion salt, garlic salt, seasoned salt, table salt, and sea salt. Canned and packaged gravies. Worcestershire sauce. Tartar sauce. Barbecue sauce. Teriyaki sauce. Soy sauce, including reduced-sodium. Steak sauce. Fish sauce. Oyster sauce. Cocktail sauce. Horseradish that you find on the shelf. Regular ketchup and mustard. Meat flavorings and tenderizers. Bouillon cubes. Hot sauce and Tabasco sauce. Premade or packaged marinades. Premade or packaged taco seasonings. Relishes. Regular salad dressings. Salsa. Potato and tortilla chips. Corn chips and puffs. Salted popcorn and pretzels. Canned or dried soups. Pizza. Frozen entrees and pot pies. Summary  Eating less sodium can help lower your blood pressure, reduce swelling, and protect your heart, liver, and kidneys.  Most people on this plan should limit their sodium intake to 1,500-2,000 mg (milligrams) of sodium each day.  Canned, boxed, and frozen foods are high in sodium. Restaurant foods, fast foods, and pizza are also very high in sodium. You also get sodium by adding salt to food.  Try to cook at home, eat more fresh fruits and vegetables, and eat less fast food, canned, processed, or prepared foods. This information is not intended to replace advice given to you by your health care provider. Make sure you discuss any questions you have with your health care provider. Document Released: 05/26/2002 Document Revised: 11/27/2016 Document Reviewed: 11/27/2016 Elsevier Interactive Patient Education  2019 [employer].

## 2018-12-23 ENCOUNTER — Inpatient Hospital Stay: Payer: Medicare Other | Attending: Hematology and Oncology | Admitting: Hematology and Oncology

## 2018-12-23 ENCOUNTER — Telehealth: Payer: Self-pay | Admitting: Hematology and Oncology

## 2018-12-23 ENCOUNTER — Telehealth: Payer: Self-pay | Admitting: Cardiology

## 2018-12-23 VITALS — BP 106/57 | HR 73 | Temp 97.6°F | Resp 18 | Ht 68.0 in | Wt 200.6 lb

## 2018-12-23 DIAGNOSIS — Z923 Personal history of irradiation: Secondary | ICD-10-CM | POA: Insufficient documentation

## 2018-12-23 DIAGNOSIS — R162 Hepatomegaly with splenomegaly, not elsewhere classified: Secondary | ICD-10-CM | POA: Diagnosis not present

## 2018-12-23 DIAGNOSIS — D696 Thrombocytopenia, unspecified: Secondary | ICD-10-CM

## 2018-12-23 DIAGNOSIS — D472 Monoclonal gammopathy: Secondary | ICD-10-CM | POA: Insufficient documentation

## 2018-12-23 DIAGNOSIS — Z79899 Other long term (current) drug therapy: Secondary | ICD-10-CM | POA: Insufficient documentation

## 2018-12-23 DIAGNOSIS — D61818 Other pancytopenia: Secondary | ICD-10-CM

## 2018-12-23 DIAGNOSIS — R600 Localized edema: Secondary | ICD-10-CM | POA: Diagnosis not present

## 2018-12-23 DIAGNOSIS — D509 Iron deficiency anemia, unspecified: Secondary | ICD-10-CM | POA: Diagnosis not present

## 2018-12-23 DIAGNOSIS — Z8547 Personal history of malignant neoplasm of testis: Secondary | ICD-10-CM

## 2018-12-23 DIAGNOSIS — D539 Nutritional anemia, unspecified: Secondary | ICD-10-CM

## 2018-12-23 NOTE — Telephone Encounter (Signed)
New Message    *STAT* If patient is at the pharmacy, call can be transferred to refill team.   1. Which medications need to be refilled? (please list name of each medication and dose if known) rosuvastatin (CRESTOR) 40 MG tablet and metoprolol tartrate (LOPRESSOR) 50 MG tablet  2. Which pharmacy/location (including street and city if local pharmacy) is medication to be sent to? [id number] - [place], [place] - [address] AT [place]  3. Do they need a 30 day or 90 day supply?

## 2018-12-23 NOTE — Telephone Encounter (Signed)
Gave avs and calendar ° °

## 2018-12-24 ENCOUNTER — Encounter: Payer: Self-pay | Admitting: Hematology and Oncology

## 2018-12-24 NOTE — Assessment & Plan Note (Signed)
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

## 2018-12-24 NOTE — Assessment & Plan Note (Signed)
The pancytopenia has improved There is a component of iron deficiency causing the anemia I recommend over-the-counter oral iron supplement I plan to see him again in the month for further follow-up

## 2018-12-24 NOTE — Progress Notes (Signed)
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 Even though the CT imaging report from September 2017 did not disclose this, I reviewed the imaging study with the patient extensively which clearly showed mild liver enlargement and splenomegaly. Rarely, autoimmune disorder that could cause pulmonary fibrosis can also cause mild thrombocytopenia. He is not symptomatic.  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 CBC with Differential/Platelet    Standing Status:   Future    Standing Expiration Date:   01/28/2020  . Iron and TIBC    Standing Status:   Future    Standing Expiration Date:   01/28/2020  . Ferritin    Standing Status:   Future    Standing Expiration Date:   01/28/2020    INTERVAL HISTORY: Please see below for problem oriented charting. He returns with his wife for further follow-up He is still undergoing physical therapy and rehab due to weakness and recurrent falls He denies recent falls The patient denies any  recent signs or symptoms of bleeding such as spontaneous epistaxis, hematuria or hematochezia. He bruises easily.  SUMMARY OF ONCOLOGIC HISTORY:  [name] Brooke Bonito. is here because of thrombocytopenia. 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  He was found to have abnormal CBC from abnormal blood work from routine blood work monitoring. I have reviewed outside records from his primary care doctor.  He is noted to have mild pancytopenia since 11/17/2015. On 11/17/2015, white blood cell count 5.9, hemoglobin 12.7 and platelet count 106 On 03/13/2016, white blood cell count 6.6, hemoglobin 12.6 and platelet count 103 On 09/06/2015, white blood cell count 6.6, hemoglobin 11.9 and platelet count 102 On 10/25/2016, white count 5.2, hemoglobin 12.7 and platelet count 86 On 11/08/16, white blood cell count 5.3, hemoglobin 12.2 implant, 107. On 09/05/2016, serum ferritin was high at 220, iron study showed serum iron low at 24 and 9% iron saturation.  Some older scanned records dated back to 12/13/2011 showed low platelet count of 101 On 12/21/2011, he had normal platelet count of 210 On 07/30/2012, his platelet count was borderline low at 143  On 09/08/2016, [name], such as spontaneous epistaxis, hematuria, melena or hematochezia. 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  He is being observed Repeat CT imaging of the abdomen and pelvis in August 2019 confirmed liver cirrhosis and splenomegaly  REVIEW OF SYSTEMS:   Constitutional: Denies fevers, chills or abnormal weight loss Eyes: Denies blurriness of vision Ears, nose, mouth, throat, and face: Denies mucositis or sore throat Respiratory: Denies cough, dyspnea or wheezes Cardiovascular: Denies palpitation, chest discomfort or lower extremity swelling Gastrointestinal:  Denies nausea, heartburn or change in bowel habits Skin: Denies abnormal skin rashes Lymphatics: Denies new lymphadenopathy  Behavioral/Psych: Mood is stable, no new changes  All other systems were reviewed with the patient and are negative.  I have reviewed the past medical history, past surgical history, social history and family history with the patient and they are unchanged from previous note.  ALLERGIES:  is allergic to allopurinol and enbrel [etanercept].  MEDICATIONS:  Current Outpatient Medications  Medication Sig Dispense Refill  . allopurinol (ZYLOPRIM) 300 MG tablet Take 1 tablet by mouth daily.    . colchicine 0.6 MG tablet Take 0.6-1.2 mg by mouth See admin instructions. Take 1.2 mg by mouth then 0.6 mg one hour later as needed for gout flares/ as directed.  1  . furosemide (LASIX) 40 MG tablet Take 1.5 tablets (60 mg total) by mouth 2 (two) times daily. 270 tablet 3  . metoprolol tartrate (LOPRESSOR) 50 MG tablet TAKE 1 TABLET TWICE DAILY (Patient taking differently: Take 50 mg by mouth 2 (two) times daily. ) 180 tablet 3  . mirabegron ER (MYRBETRIQ) 50 MG TB24 tablet Take 1 tablet (50 mg total) by mouth daily. 30 tablet 3  . pantoprazole (PROTONIX) 40 MG tablet Take 40 mg by mouth daily.    . potassium chloride SA (K-DUR,KLOR-CON) 20 MEQ tablet Take 20 mEq by mouth daily.      . rosuvastatin (CRESTOR) 40 MG tablet Take 1  tablet (40 mg total) by mouth daily. DUE FOR APPT 90 tablet 2  . sertraline (ZOLOFT) 100 MG tablet Take 100 mg by mouth daily.    . Tamsulosin HCl (FLOMAX) 0.4 MG CAPS Take 0.4 mg by mouth at bedtime.      No current facility-administered medications for this visit.     PHYSICAL EXAMINATION: ECOG PERFORMANCE STATUS: 2 - Symptomatic, <50% confined to bed  Vitals:   12/23/18 1047  BP: (!) 106/57  Pulse: 73  Resp: 18  Temp: 97.6 F (36.4 C)  SpO2: 100%   Filed Weights   12/23/18 1047  Weight: 200 lb 9.6 oz (91 kg)    GENERAL:alert, no distress and comfortable SKIN: Noted excessive skin bruises NEURO: alert & oriented x 3 with fluent speech  LABORATORY DATA:  I have reviewed the data as listed    Component Value Date/Time   NA 142 12/16/2018 1442   NA 143 04/23/2018 0952   NA 141 02/23/2017 1237   K 3.7 12/16/2018 1442   K 3.8 02/23/2017 1237   CL 103 12/16/2018 1442   CO2 30 12/16/2018 1442   CO2 27 02/23/2017 1237   GLUCOSE 95 12/16/2018 1442   GLUCOSE 113 02/23/2017 1237   BUN 20 12/16/2018 1442   BUN 20 04/23/2018 0952   BUN 18.3 02/23/2017 1237   CREATININE  1.30 (H) 12/16/2018 1442   CREATININE 1.2 02/23/2017 1237   CALCIUM 9.5 12/16/2018 1442   CALCIUM 9.6 02/23/2017 1237   PROT 8.5 (H) 12/16/2018 1442   PROT 7.3 06/18/2018 1205   PROT 8.0 02/23/2017 1237   ALBUMIN 3.9 12/16/2018 1442   ALBUMIN 4.1 09/24/2017 0824   ALBUMIN 4.1 02/23/2017 1237   AST 31 12/16/2018 1442   AST 24 02/23/2017 1237   ALT 18 12/16/2018 1442   ALT 15 02/23/2017 1237   ALKPHOS 99 12/16/2018 1442   ALKPHOS 105 02/23/2017 1237   BILITOT 0.8 12/16/2018 1442   BILITOT 0.6 09/24/2017 0824   BILITOT 0.98 02/23/2017 1237   GFRNONAA 53 (L) 12/16/2018 1442   GFRAA >60 12/16/2018 1442    No results found for: SPEP, UPEP  Lab Results  Component Value Date   WBC 5.0 12/16/2018   NEUTROABS 3.9 12/16/2018   HGB 10.5 (L) 12/16/2018   HCT 34.6 (L) 12/16/2018   HCT 33.4 (L)  12/16/2018   MCV 93.3 12/16/2018   PLT 105 (L) 12/16/2018      Chemistry      Component Value Date/Time   NA 142 12/16/2018 1442   NA 143 04/23/2018 0952   NA 141 02/23/2017 1237   K 3.7 12/16/2018 1442   K 3.8 02/23/2017 1237   CL 103 12/16/2018 1442   CO2 30 12/16/2018 1442   CO2 27 02/23/2017 1237   BUN 20 12/16/2018 1442   BUN 20 04/23/2018 0952   BUN 18.3 02/23/2017 1237   CREATININE 1.30 (H) 12/16/2018 1442   CREATININE 1.2 02/23/2017 1237      Component Value Date/Time   CALCIUM 9.5 12/16/2018 1442   CALCIUM 9.6 02/23/2017 1237   ALKPHOS 99 12/16/2018 1442   ALKPHOS 105 02/23/2017 1237   AST 31 12/16/2018 1442   AST 24 02/23/2017 1237   ALT 18 12/16/2018 1442   ALT 15 02/23/2017 1237   BILITOT 0.8 12/16/2018 1442   BILITOT 0.6 09/24/2017 0824   BILITOT 0.98 02/23/2017 1237      All questions were answered. The patient knows to call the clinic with any problems, questions or concerns. No barriers to learning was detected.  I spent 15 minutes counseling the patient face to face. The total time spent in the appointment was 20 minutes and more than 50% was on counseling and review of test results  [name], MD 12/24/2018 1:53 PM

## 2018-12-24 NOTE — Assessment & Plan Note (Signed)
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 Even though the CT imaging report from September 2017 did not disclose this, I reviewed the imaging study with the patient extensively which clearly showed mild liver enlargement and splenomegaly. Rarely, autoimmune disorder that could cause pulmonary fibrosis can also cause mild thrombocytopenia. He is not symptomatic.  Previous autoimmune screen, hepatitis C screening and serum vitamin B12 were within normal limits Observe only for now 

## 2018-12-25 MED ORDER — ROSUVASTATIN CALCIUM 40 MG PO TABS: 40.0000 mg | ORAL_TABLET | Freq: Every day | ORAL | 2 refills | Status: DC

## 2018-12-25 MED ORDER — METOPROLOL TARTRATE 50 MG PO TABS: 50.0000 mg | ORAL_TABLET | Freq: Two times a day (BID) | ORAL | 3 refills | Status: DC

## 2018-12-26 ENCOUNTER — Ambulatory Visit (HOSPITAL_COMMUNITY): Payer: Medicare Other | Attending: Cardiology

## 2018-12-26 ENCOUNTER — Other Ambulatory Visit: Payer: Self-pay

## 2018-12-26 DIAGNOSIS — I5033 Acute on chronic diastolic (congestive) heart failure: Secondary | ICD-10-CM

## 2018-12-26 DIAGNOSIS — R600 Localized edema: Secondary | ICD-10-CM

## 2018-12-26 DIAGNOSIS — R350 Frequency of micturition: Secondary | ICD-10-CM | POA: Diagnosis not present

## 2018-12-26 DIAGNOSIS — G4719 Other hypersomnia: Secondary | ICD-10-CM | POA: Diagnosis not present

## 2018-12-26 MED ORDER — PERFLUTREN LIPID MICROSPHERE
1.0000 mL | INTRAVENOUS | Status: AC | PRN
  Administered 2018-12-26: 2 mL via INTRAVENOUS

## 2018-12-27 DIAGNOSIS — N401 Enlarged prostate with lower urinary tract symptoms: Secondary | ICD-10-CM | POA: Diagnosis not present

## 2018-12-27 DIAGNOSIS — R3915 Urgency of urination: Secondary | ICD-10-CM | POA: Diagnosis not present

## 2018-12-27 DIAGNOSIS — N3941 Urge incontinence: Secondary | ICD-10-CM | POA: Diagnosis not present

## 2019-01-01 ENCOUNTER — Ambulatory Visit: Payer: Medicare Other | Admitting: Neurology

## 2019-01-01 ENCOUNTER — Encounter

## 2019-01-09 ENCOUNTER — Encounter: Payer: Self-pay | Admitting: Neurology

## 2019-01-13 ENCOUNTER — Encounter: Payer: Self-pay | Admitting: Neurology

## 2019-01-13 ENCOUNTER — Ambulatory Visit (INDEPENDENT_AMBULATORY_CARE_PROVIDER_SITE_OTHER): Payer: Medicare Other | Admitting: Neurology

## 2019-01-13 VITALS — BP 119/75 | HR 100 | Ht 68.0 in | Wt 194.0 lb

## 2019-01-13 DIAGNOSIS — F0281 Dementia in other diseases classified elsewhere with behavioral disturbance: Secondary | ICD-10-CM

## 2019-01-13 DIAGNOSIS — I10 Essential (primary) hypertension: Secondary | ICD-10-CM | POA: Diagnosis not present

## 2019-01-13 DIAGNOSIS — I480 Paroxysmal atrial fibrillation: Secondary | ICD-10-CM | POA: Diagnosis not present

## 2019-01-13 DIAGNOSIS — D472 Monoclonal gammopathy: Secondary | ICD-10-CM | POA: Diagnosis not present

## 2019-01-13 DIAGNOSIS — G4733 Obstructive sleep apnea (adult) (pediatric): Secondary | ICD-10-CM

## 2019-01-13 DIAGNOSIS — I2510 Atherosclerotic heart disease of native coronary artery without angina pectoris: Secondary | ICD-10-CM | POA: Diagnosis not present

## 2019-01-13 DIAGNOSIS — J8410 Pulmonary fibrosis, unspecified: Secondary | ICD-10-CM

## 2019-01-13 DIAGNOSIS — Z950 Presence of cardiac pacemaker: Secondary | ICD-10-CM

## 2019-01-13 DIAGNOSIS — G3183 Dementia with Lewy bodies: Secondary | ICD-10-CM | POA: Diagnosis not present

## 2019-01-13 DIAGNOSIS — G4719 Other hypersomnia: Secondary | ICD-10-CM | POA: Diagnosis not present

## 2019-01-13 NOTE — Progress Notes (Signed)
SLEEP MEDICINE CLINIC   Provider:  Larey Seat, M D  Primary Care Physician:  [name], MD   Referring Provider: [name], MD   Chief Complaint  Patient presents with  . New Patient (Initial Visit)    pt with wife, rm 83. pt states that he had a sleep study completed est 8 years. he was started on CPAP machine. He got his machine through [facility]. current machine used since 05/2017.     HPI:  [name]. is a 78 y.o. male patient with many co-morbidities and followed at [facility] by dr Jannifer Franklin, MD- , seen here on 01-13-2019  in a referral  from Dr. Shelia Media for specific sleepiness related questions.   Dr. Shelia Media was concerned about hypersomnia , persistent while the patient is on CPAP. He has been followed by Dr. Shelia Media for OSA and had been compliantly using CPAP until he encountered difficulties with DME [facility], and failed tp receive new supplies. He had not been able to use CPAP since dec 2019 and  Noted his persistent hypersomnia worsening.  " all I got to do to sleep is sit down" - and " I am even sleeping leaning to a wall".  Mrs. Claud Kelp, who accompanied her husband here today, also injected that her husband lost a lot of weight and that his snoring when not using CPAP has become much more moderate.  However she is not sure that he does not still have apnea.  I was also able to review his last compliance report back from November through 6 December when he was unable to use the machine for longer than 3 hours on average.  Most of the nights he had been using the machine for only 3 hours 54 minutes just below the cutoff for compliance.  His residual AHI while using CPAP was 2.5/h which speaks to a good resolution of apnea the 95th percentile pressure for this AutoSet machine was 10.4 cmH2O out of a window between 4 and 20 cm with 2 cm expiratory pressure relief.  There are no central apneas emerging the residual is obstructive in nature and I think that he still needs CPAP, as there is no  other explanation why his 95th percentile was 10.5 cm. His autotitration device is about 76 years old, following a sleep study with [facility]- Dr Shelia Media.    Dr Jannifer Franklin' patient was last seen on 12-04-2018,  quoted here is his introduction:  " Reason for visit: Memory disturbance, peripheral neuropathy, gait disturbance [name]. is an 78 y.o. male History of present illness: [name] is a 78 year old white male with a history of diabetes, coronary artery disease, sleep apnea on CPAP and a history of atrial fibrillation.  The patient has developed a short-term memory issue, he is on oxybutynin for his bladder currently.  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  The patient has not yet had a bone marrow biopsy.  His anemia has worsened recently.  He has had significant issues with falling, he is just started to use a walker, he is in physical therapy for his gait.  The patient was seen in the emergency room on 22 November 2018 following a fall.  CT of the lumbar, thoracic, and cervical spine were done at that time.  The patient returns for an evaluation.  He is having  hallucinations over the last 6 to 8 weeks, he may have one event a week, he may see people or animals in the room, this mainly occurs at nighttime.  He has excessive daytime drowsiness, he is on CPAP."  Sleep habits are as follows: Supper time is variable since retirement- mostly 7 PM, and bedtime later than midnight. 1.15 AM - and he has stayed awake for 24 hours, and doesn't even remember what he did at night. He reports no nocturia. Snoring.  Bedroom is cool, quiet , dark.  He sleeps in bed, rarely in a recliner. Sleeps on his back- on an adjusted, raised head of bed with one pillow. Most nights sleeps through 2-3 hours only- and when he wakes will stay up and goes to the  kitchen. Comes back to bed after a cup of coffee. He wakes up from dreams, vivid , hallucinations, talks in his sleep, but can't remember them.  He rises at 6 AM,spontaneously -always an early riser.   Sleep medical history:  See above    Family sleep history: son has OSA on CPAP.    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  Has one adult son, one daughter. He is  married, quit smoking 28 year ago, alcohol- none. Caffeine - yes, coffee, tea and sodas.   Review of Systems: Out of a complete 14 system review, the patient complains of only the following symptoms, and all other reviewed systems are negative.  A sleep hallucinations, vivid dreams, EDS and sleep talking untreated OSA, CPAP is relatively new.  A fib with pacemaker, CAD, Bypass, dementia. Gait disorder.    Epworth Sleepiness score: 24/ 24 - any time , any lace, any length   , Fatigue severity score; 56 /63  , depression score N/a    Social History   Socioeconomic History  . Marital status: Married    Spouse name: [name]  . Number of children: 2  . Years of education: Not on file  . Highest education level: Not on file  Occupational History  . Occupation: Retired- IT trainer  Social Needs  . Financial resource strain: Not on file  . Food insecurity:    Worry: Not on file    Inability: Not on file  . Transportation needs:    Medical: Not on file    Non-medical: Not on file  Tobacco Use  . Smoking status: Former Smoker    Last attempt to quit: 02/21/1991    Years since quitting: 27.9  . Smokeless tobacco: Never Used  Substance and Sexual Activity  . Alcohol use: No    Alcohol/week: 0.0 standard drinks  . Drug use: No  . Sexual activity: Not Currently  Lifestyle  . Physical activity:    Days per week: Not on file    Minutes per session: Not on file  . Stress: Not on file  Relationships  . Social connections:     Talks on phone: Not on file    Gets together: Not on file    Attends religious service: Not on file    Active member of club or organization: Not on file    Attends meetings of clubs or organizations: Not on file    Relationship status: Not on file  . Intimate partner violence:    Fear of current or ex partner: Not on file    Emotionally abused: Not on file    Physically abused:  Not on file    Forced sexual activity: Not on file  Other Topics Concern  . Not on file  Social History Narrative   Lives with wife   Right handed    Married with two children.     He is a Engineer, structural.      Family History  Problem Relation Age of Onset  . Heart disease Father   . Heart attack Father   . Heart failure Father   . Heart disease Mother   . Alzheimer's disease Mother   . Dementia Mother     Past Medical History:  Diagnosis Date  . (HFpEF) heart failure with preserved ejection fraction ([place])    a. 05/2013 Echo: EF 55%, mild LVH, diast dysfxn, Ao sclerosis, mildly dil LA, RV dysfxn (poorly visualized), PASP 38mHg;  b. 03/2017 Echo: EF 55-60%, no rwma, triv MR, mildly dil RV, mod TR, PASP 416mg.  . [facility]   s/p Cox Maze 1/09;  Multaq Rx d/c'd in 2014 due to pulmo fibrosis;  coumadin d/c'd in 2014 due to spontaneous subdural hematoma  . BPH (benign prostatic hyperplasia)   . CAD (coronary artery disease), native coronary artery    a. s/p CABG 12/2007;  b. Myoview 12/2011: EF 66%, no scar or ischemia; normal.  . DM (diabetes mellitus) ([place]  . Hyperlipidemia type II   . Hypertension   . 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 OSA (obstructive sleep apnea)   . Pacemaker    PPM - St. Jude  . Peripheral neuropathy 07/31/2018  . [place]monary fibrosis ([place]   Multaq d/c'd 7/14  . Subdural hematoma ([place]08/2013   spontaneous;  coumadin d/c'd => no longer a candidate for anticoagulation    Past Surgical History:  Procedure Laterality  Date  . APPENDECTOMY    . CHOLECYSTECTOMY    . CORONARY ARTERY BYPASS GRAFT     x3 (left internal mammary artery to distal left anterior descending coronary artery, saphenous vain graft to second circumflex marginal branch, saphenous vain graft to posterior descending coronary artery, endoscopic saphenous vain harvest from right thigh) and modified Cox - Maze IV procedure.  ClValentina GuOwen,MD. Electronically signed CHO/MEDQ D: 01/09/2008/ JOB: [id number]:  JoIran SizerD  . CRKyla Balzarine8/13/2013   Procedure: CRANIOTOMY HEMATOMA EVACUATION SUBDURAL;  Surgeon: GaElaina HoopsMD;  [facility];  Service: Neurosurgery;  Laterality: Right;  Right craniotomy for evacuation of subdural hematoma  . FOOT SURGERY    . HERNIA REPAIR    . ORCHIECTOMY     Left  /  testicular cancer  . PACEMAKER PLACEMENT     PPM - St. Jude    Current Outpatient Medications  Medication Sig Dispense Refill  . allopurinol (ZYLOPRIM) 300 MG tablet Take 1 tablet by mouth daily.    . ferrous sulfate 325 (65 FE) MG tablet Take 325 mg by mouth daily with breakfast.    . furosemide (LASIX) 40 MG tablet Take 1.5 tablets (60 mg total) by mouth 2 (two) times daily. 270 tablet 3  . metoprolol tartrate (LOPRESSOR) 50 MG tablet Take 1 tablet (50 mg total) by mouth 2 (two) times daily. 180 tablet 3  . mirabegron ER (MYRBETRIQ) 50 MG TB24 tablet Take 1 tablet (50 mg total) by mouth daily. 30 tablet 3  . pantoprazole (PROTONIX) 40 MG tablet Take 40 mg by mouth daily.    . potassium chloride SA (K-DUR,KLOR-CON)  20 MEQ tablet Take 20 mEq by mouth daily.      . rosuvastatin (CRESTOR) 40 MG tablet Take 1 tablet (40 mg total) by mouth daily. 90 tablet 2  . sertraline (ZOLOFT) 100 MG tablet Take 200 mg by mouth daily.     . Tamsulosin HCl (FLOMAX) 0.4 MG CAPS Take 0.4 mg by mouth at bedtime.     . colchicine 0.6 MG tablet Take 0.6-1.2 mg by mouth See admin instructions. Take 1.2 mg by mouth then 0.6 mg one hour later as needed for  gout flares/ as directed.  1   No current facility-administered medications for this visit.     Allergies as of 01/13/2019 - Review Complete 01/13/2019  Allergen Reaction Noted  . Lipitor [atorvastatin]  01/09/2019  . Nsaids  01/09/2019  . Warfarin and related  01/09/2019  . Allopurinol Itching 03/19/2017  . Enbrel [etanercept] Itching 03/19/2017    Vitals: BP 119/75   Pulse 100   Ht _0  (1.727 m)   Wt 194 lb (88 kg)   BMI 29.50 kg/m  Last Weight:  Wt Readings from Last 1 Encounters:  01/13/19 194 lb (88 kg)   BTD:HRCB mass index is 29.5 kg/m.     Last Height:   Ht Readings from Last 1 Encounters:  01/13/19 _1  (1.727 m)    Physical exam:  General: The patient is awake, alert and appears not in acute distress. The patient is groomed. Head: Normocephalic, atraumatic. Neck is supple. Mallampati 3  neck circumference:17" . Nasal airflow patent , TMJ click is evident .  Retrognathia is not seen.  Tongue deviated to the right. Dry mouth.  Cardiovascular:  irregular rate and rhythm - skips beats,  But without  murmurs or carotid bruit, and without distended neck veins. Respiratory: Lungs are clear to auscultation. Skin:  Without evidence of edema, or rash Trunk: BMI is 29.5 kg/m2. The patient's posture is relaxed, he leans to the right.   Neurologic exam : The patient is awake and alert, oriented to place and time.   Memory subjective  described as impaired - Memory testing was doe with his primary MD , Dr. Jannifer Franklin.   MOCA:No flowsheet data found. MMSE: MMSE - Mini Mental State Exam 11/26/2018 06/18/2018  Orientation to time 5 5  Orientation to Place 4 5  Registration 3 3  Attention/ Calculation 5 5  Recall 2 3  Language- name 2 objects 2 2  Language- repeat 1 1  Language- follow 3 step command 3 3  Language- read & follow direction 1 1  Write a sentence 1 1  Copy design 1 1  Total score 28 30    Needs MOCA as his MMSE exceeds 26.    Attention span &  concentration ability appears limited. Name finding is impaired. Speech is non-fluent,  Without dysarthria, dysphonia but with wordfining problems/ aphasia.  Mood and affect are frustrated .  Cranial nerves: Pupils are equally round and and  reactive to light. There were difficulties with accommodations, but he wore no reading glasses.  Extraocular movements  in vertical and horizontal planes intact and with nystagmus to the left- at endpoint.. Visual fields by finger perimetry are intact. Hearing to finger rub intact.  Facial sensation intact to fine touch. Facial motor strength is symmetric and tongue and uvula move midline. Shoulder shrug was symmetrical.   Motor exam: Normal tone, muscle bulk and symmetric strength in all extremities.  Sensory:  Fine touch, pinprick and vibration were tested  in all extremities. Proprioception tested in the upper extremities was normal.  Coordination: Rapid alternating movements in the fingers/hands was slowed. Finger-to-nose maneuver normal without evidence of ataxia, dysmetria or tremor.  Gait and station: Patient walks with assistive device - Stance is stable and normal. Tandem gait deferred. Gilford Rile works better than the cane- ataxia with neuropathy.   Deep tendon reflexes: in the  upper and lower extremities are symmetric and intact. Babinski maneuver response is  downgoing.  Assessment:  After physical and neurologic examination, review of laboratory studies,  Personal review of imaging studies, reports of other /same  Imaging studies, results of polysomnography and / or neurophysiology testing and pre-existing records as far as provided in visit., my assessment is:    1) memory loss, vivid dream- hallucinations and not using CPAP- I wondered about lewy body dementia. He has elevated muscle tone, not cog-wheeling.  He drops things. He has fallen out of bed, talks in his sleep.  Needs a MOCA .   2) EDS- OSA -He needs supplies ASAP. His excessive daytime  sleepiness may be strongly related to the interrupted  CPAP therapy.   3 ) neuropathy and gait instability , nystagmus- followed by dr Jannifer Franklin, patient reports ankle edema is weeping !    The patient was advised of the nature of the diagnosed disorder , the treatment options and the  risks for general health and wellness arising from not treating the condition.   I spent more than 50 minutes of face to face time with the patient.  Greater than 50% of time was spent in counseling and coordination of care. We have discussed the diagnosis and differential and I answered the patient's questions.    Plan:  Treatment plan and additional workup :  Mr. Darragh needs new supplies through [facility] for continued CPAP-THERAPY.  He should under go a sleep study designed to look at REM BD - lewy body related?  He needs a MOCA .   Larey Seat, MD 12/27/2109, 7:35 PM  Certified in Neurology by ABPN Certified in [place] by [facility] [address] [place], [place] [address]

## 2019-01-22 ENCOUNTER — Encounter: Payer: Self-pay | Admitting: *Deleted

## 2019-01-30 ENCOUNTER — Other Ambulatory Visit: Payer: Medicare Other | Admitting: *Deleted

## 2019-01-30 DIAGNOSIS — R600 Localized edema: Secondary | ICD-10-CM | POA: Diagnosis not present

## 2019-01-31 ENCOUNTER — Encounter: Payer: Self-pay | Admitting: *Deleted

## 2019-01-31 LAB — BASIC METABOLIC PANEL
BUN/Creatinine Ratio: 16 (ref 10–24)
BUN: 15 mg/dL (ref 8–27)
CO2: 24 mmol/L (ref 20–29)
Calcium: 9.4 mg/dL (ref 8.6–10.2)
Chloride: 108 mmol/L — ABNORMAL HIGH (ref 96–106)
Creatinine, Ser: 0.96 mg/dL (ref 0.76–1.27)
GFR calc Af Amer: 88 mL/min/{1.73_m2} (ref >59)
GFR calc non Af Amer: 76 mL/min/{1.73_m2} (ref >59)
Glucose: 93 mg/dL (ref 65–99)
Potassium: 4.2 mmol/L (ref 3.5–5.2)
Sodium: 145 mmol/L — ABNORMAL HIGH (ref 134–144)

## 2019-02-03 ENCOUNTER — Inpatient Hospital Stay: Payer: Medicare Other | Attending: Hematology and Oncology

## 2019-02-03 ENCOUNTER — Ambulatory Visit (INDEPENDENT_AMBULATORY_CARE_PROVIDER_SITE_OTHER): Payer: Medicare Other | Admitting: Neurology

## 2019-02-03 ENCOUNTER — Inpatient Hospital Stay: Payer: Medicare Other

## 2019-02-03 DIAGNOSIS — I10 Essential (primary) hypertension: Secondary | ICD-10-CM

## 2019-02-03 DIAGNOSIS — J8410 Pulmonary fibrosis, unspecified: Secondary | ICD-10-CM | POA: Insufficient documentation

## 2019-02-03 DIAGNOSIS — G4719 Other hypersomnia: Secondary | ICD-10-CM

## 2019-02-03 DIAGNOSIS — Z950 Presence of cardiac pacemaker: Secondary | ICD-10-CM

## 2019-02-03 DIAGNOSIS — K760 Fatty (change of) liver, not elsewhere classified: Secondary | ICD-10-CM | POA: Diagnosis not present

## 2019-02-03 DIAGNOSIS — G4731 Primary central sleep apnea: Secondary | ICD-10-CM | POA: Diagnosis not present

## 2019-02-03 DIAGNOSIS — D61818 Other pancytopenia: Secondary | ICD-10-CM

## 2019-02-03 DIAGNOSIS — N3941 Urge incontinence: Secondary | ICD-10-CM | POA: Diagnosis not present

## 2019-02-03 DIAGNOSIS — R161 Splenomegaly, not elsewhere classified: Secondary | ICD-10-CM | POA: Diagnosis not present

## 2019-02-03 DIAGNOSIS — K7460 Unspecified cirrhosis of liver: Secondary | ICD-10-CM | POA: Insufficient documentation

## 2019-02-03 DIAGNOSIS — F02818 Dementia in other diseases classified elsewhere, unspecified severity, with other behavioral disturbance: Secondary | ICD-10-CM

## 2019-02-03 DIAGNOSIS — I2510 Atherosclerotic heart disease of native coronary artery without angina pectoris: Secondary | ICD-10-CM

## 2019-02-03 DIAGNOSIS — F0281 Dementia in other diseases classified elsewhere with behavioral disturbance: Secondary | ICD-10-CM

## 2019-02-03 DIAGNOSIS — G4734 Idiopathic sleep related nonobstructive alveolar hypoventilation: Secondary | ICD-10-CM

## 2019-02-03 DIAGNOSIS — D509 Iron deficiency anemia, unspecified: Secondary | ICD-10-CM | POA: Insufficient documentation

## 2019-02-03 DIAGNOSIS — I480 Paroxysmal atrial fibrillation: Secondary | ICD-10-CM

## 2019-02-03 DIAGNOSIS — G4733 Obstructive sleep apnea (adult) (pediatric): Secondary | ICD-10-CM

## 2019-02-03 DIAGNOSIS — Z79899 Other long term (current) drug therapy: Secondary | ICD-10-CM | POA: Diagnosis not present

## 2019-02-03 DIAGNOSIS — D472 Monoclonal gammopathy: Secondary | ICD-10-CM

## 2019-02-03 DIAGNOSIS — G3183 Dementia with Lewy bodies: Secondary | ICD-10-CM

## 2019-02-03 DIAGNOSIS — D696 Thrombocytopenia, unspecified: Secondary | ICD-10-CM | POA: Diagnosis not present

## 2019-02-03 DIAGNOSIS — R162 Hepatomegaly with splenomegaly, not elsewhere classified: Secondary | ICD-10-CM | POA: Diagnosis not present

## 2019-02-03 DIAGNOSIS — N401 Enlarged prostate with lower urinary tract symptoms: Secondary | ICD-10-CM | POA: Diagnosis not present

## 2019-02-03 DIAGNOSIS — D539 Nutritional anemia, unspecified: Secondary | ICD-10-CM

## 2019-02-03 LAB — CBC WITH DIFFERENTIAL/PLATELET
Abs Immature Granulocytes: 0.02 10*3/uL (ref 0.00–0.07)
Basophils Absolute: 0 10*3/uL (ref 0.0–0.1)
Basophils Relative: 1 %
Eosinophils Absolute: 0.1 10*3/uL (ref 0.0–0.5)
Eosinophils Relative: 2 %
HCT: 32.6 % — ABNORMAL LOW (ref 39.0–52.0)
Hemoglobin: 10.3 g/dL — ABNORMAL LOW (ref 13.0–17.0)
Immature Granulocytes: 0 %
Lymphocytes Relative: 20 %
Lymphs Abs: 1 10*3/uL (ref 0.7–4.0)
MCH: 29.1 pg (ref 26.0–34.0)
MCHC: 31.6 g/dL (ref 30.0–36.0)
MCV: 92.1 fL (ref 80.0–100.0)
Monocytes Absolute: 0.2 10*3/uL (ref 0.1–1.0)
Monocytes Relative: 4 %
Neutro Abs: 3.4 10*3/uL (ref 1.7–7.7)
Neutrophils Relative %: 73 %
Platelets: 78 10*3/uL — ABNORMAL LOW (ref 150–400)
RBC: 3.54 MIL/uL — ABNORMAL LOW (ref 4.22–5.81)
RDW: 16.5 % — ABNORMAL HIGH (ref 11.5–15.5)
WBC: 4.7 10*3/uL (ref 4.0–10.5)
nRBC: 0 % (ref 0.0–0.2)

## 2019-02-04 ENCOUNTER — Encounter: Payer: Self-pay | Admitting: Hematology and Oncology

## 2019-02-04 ENCOUNTER — Inpatient Hospital Stay (HOSPITAL_BASED_OUTPATIENT_CLINIC_OR_DEPARTMENT_OTHER): Payer: Medicare Other | Admitting: Hematology and Oncology

## 2019-02-04 DIAGNOSIS — J8410 Pulmonary fibrosis, unspecified: Secondary | ICD-10-CM | POA: Diagnosis not present

## 2019-02-04 DIAGNOSIS — D509 Iron deficiency anemia, unspecified: Secondary | ICD-10-CM | POA: Diagnosis not present

## 2019-02-04 DIAGNOSIS — R161 Splenomegaly, not elsewhere classified: Secondary | ICD-10-CM

## 2019-02-04 DIAGNOSIS — R162 Hepatomegaly with splenomegaly, not elsewhere classified: Secondary | ICD-10-CM | POA: Diagnosis not present

## 2019-02-04 DIAGNOSIS — K760 Fatty (change of) liver, not elsewhere classified: Secondary | ICD-10-CM

## 2019-02-04 DIAGNOSIS — D696 Thrombocytopenia, unspecified: Secondary | ICD-10-CM | POA: Diagnosis not present

## 2019-02-04 DIAGNOSIS — Z79899 Other long term (current) drug therapy: Secondary | ICD-10-CM

## 2019-02-04 NOTE — Progress Notes (Signed)
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 Even though the CT imaging report from September 2017 did not disclose this, I reviewed the imaging study with the patient extensively which clearly showed mild liver enlargement and splenomegaly. Rarely, autoimmune disorder that could cause pulmonary fibrosis can also cause mild thrombocytopenia. He is not symptomatic.  Previous autoimmune screen, hepatitis C screening and serum vitamin B12 were within normal limits Observe only for now  Iron deficiency anemia He has iron deficiency anemia and is taking oral iron supplement daily with meals I recommend him to take iron supplement approximately 30 minutes before meals.  Repeat iron study is pending.  He is not symptomatic I will call him as soon as test results of iron studies are available to determine his next follow-up and whether he needs to increase the dose of oral iron supplement.   No orders of the defined types were placed in this encounter.   INTERVAL HISTORY: Please see below for problem oriented charting. He returns with his wife for further follow-up He has been taking oral iron supplement daily with breakfast He denies bloating or constipation. He felt more energetic since last time I saw him.  No recent infection The patient denies any recent signs or symptoms of bleeding such as spontaneous epistaxis, hematuria or hematochezia.   SUMMARY OF ONCOLOGIC HISTORY:  [name]. is here because of thrombocytopenia. This patient has interesting background history of testicular cancer  status post surgery and radiation and also history of pulmonary fibrosis.  He was found to have abnormal CBC from abnormal blood work from routine blood work monitoring. I have reviewed outside records from his primary care doctor.  He is noted to have mild pancytopenia since 11/17/2015. On 11/17/2015, white blood cell count 5.9, hemoglobin 12.7 and platelet count 106 On 03/13/2016, white blood cell count 6.6, hemoglobin 12.6 and platelet count 103 On 09/06/2015, white blood cell count 6.6, hemoglobin 11.9 and platelet count 102 On 10/25/2016, white count 5.2, hemoglobin 12.7 and platelet count 86 On 11/08/16, white blood cell count 5.3, hemoglobin 12.2 implant, 107. On 09/05/2016, serum ferritin was high at 220, iron study showed serum iron low at 24 and 9% iron saturation.  Some older scanned records dated back to 12/13/2011 showed low platelet count of 101 On 12/21/2011, he had normal platelet count of 210 On 07/30/2012, his platelet count was borderline low at 143  On 09/08/2016, [name], such as spontaneous epistaxis, hematuria, 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  He is being observed  Repeat CT imaging of the abdomen and pelvis in August 2019 confirmed liver cirrhosis and splenomegaly He was found to have iron deficiency anemia and  was placed on oral iron supplement  REVIEW OF SYSTEMS:   Constitutional: Denies fevers, chills or abnormal weight loss Eyes: Denies blurriness of vision Ears, nose, mouth, throat, and face: Denies mucositis or sore throat Respiratory: Denies cough, dyspnea or wheezes Cardiovascular: Denies palpitation, chest discomfort or lower extremity swelling Gastrointestinal:  Denies nausea, heartburn or change in bowel habits Skin: Denies abnormal skin rashes Lymphatics: Denies new lymphadenopathy  Neurological:Denies numbness, tingling or new weaknesses Behavioral/Psych: Mood is stable, no new changes  All other systems were reviewed with the patient and are negative.  I have reviewed the past medical history, past surgical history, social history and family history with the patient and they are unchanged from previous note.  ALLERGIES:  is allergic to lipitor [atorvastatin]; nsaids; warfarin and related; allopurinol; and enbrel [etanercept].  MEDICATIONS:  Current Outpatient Medications  Medication Sig Dispense Refill  . allopurinol (ZYLOPRIM) 300 MG tablet Take 1 tablet by mouth daily.    . colchicine 0.6 MG tablet Take 0.6-1.2 mg by mouth See admin instructions. Take 1.2 mg by mouth then 0.6 mg one hour later as needed for gout flares/ as directed.  1  . ferrous sulfate 325 (65 FE) MG tablet Take 325 mg by mouth daily with breakfast.    . furosemide (LASIX) 40 MG tablet Take 1.5 tablets (60 mg total) by mouth 2 (two) times daily. 270 tablet 3  . metoprolol tartrate (LOPRESSOR) 50 MG tablet Take 1 tablet (50 mg total) by mouth 2 (two) times daily. 180 tablet 3  . mirabegron ER (MYRBETRIQ) 50 MG TB24 tablet Take 1 tablet (50 mg total) by mouth daily. 30 tablet 3  . pantoprazole (PROTONIX) 40 MG tablet Take 40 mg by mouth daily.    . potassium chloride SA (K-DUR,KLOR-CON) 20 MEQ tablet Take 20 mEq by mouth daily.      . rosuvastatin (CRESTOR) 40 MG tablet Take 1 tablet (40 mg total) by mouth  daily. 90 tablet 2  . sertraline (ZOLOFT) 100 MG tablet Take 200 mg by mouth daily.     . Tamsulosin HCl (FLOMAX) 0.4 MG CAPS Take 0.4 mg by mouth at bedtime.      No current facility-administered medications for this visit.     PHYSICAL EXAMINATION: ECOG PERFORMANCE STATUS: 1 - Symptomatic but completely ambulatory  Vitals:   02/04/19 1143  BP: (!) 107/57  Pulse: 60  Resp: 18  Temp: (!) 97.4 F (36.3 C)  SpO2: 100%   Filed Weights   02/04/19 1143  Weight: 199 lb 3.2 oz (90.4 kg)    GENERAL:alert, no distress and comfortable SKIN: skin color, texture, turgor are normal, no rashes or significant lesions. Noted skin bruising Musculoskeletal:no cyanosis of digits and no clubbing  NEURO: alert & oriented x 3 with fluent speech, no focal motor/sensory deficits  LABORATORY DATA:  I have reviewed the data as listed    Component Value Date/Time   NA 145 (H) 01/30/2019 1532   NA 141 02/23/2017 1237   K 4.2 01/30/2019 1532   K 3.8 02/23/2017 1237   CL 108 (H) 01/30/2019 1532   CO2 24 01/30/2019 1532   CO2 27 02/23/2017 1237   GLUCOSE 93 01/30/2019 1532   GLUCOSE 95 12/16/2018 1442   GLUCOSE 113 02/23/2017 1237   BUN 15 01/30/2019 1532   BUN 18.3 02/23/2017 1237  CREATININE 0.96 01/30/2019 1532   CREATININE 1.2 02/23/2017 1237   CALCIUM 9.4 01/30/2019 1532   CALCIUM 9.6 02/23/2017 1237   PROT 8.5 (H) 12/16/2018 1442   PROT 7.3 06/18/2018 1205   PROT 8.0 02/23/2017 1237   ALBUMIN 3.9 12/16/2018 1442   ALBUMIN 4.1 09/24/2017 0824   ALBUMIN 4.1 02/23/2017 1237   AST 31 12/16/2018 1442   AST 24 02/23/2017 1237   ALT 18 12/16/2018 1442   ALT 15 02/23/2017 1237   ALKPHOS 99 12/16/2018 1442   ALKPHOS 105 02/23/2017 1237   BILITOT 0.8 12/16/2018 1442   BILITOT 0.6 09/24/2017 0824   BILITOT 0.98 02/23/2017 1237   GFRNONAA 76 01/30/2019 1532   GFRAA 88 01/30/2019 1532    No results found for: SPEP, UPEP  Lab Results  Component Value Date   WBC 4.7 02/03/2019    NEUTROABS 3.4 02/03/2019   HGB 10.3 (L) 02/03/2019   HCT 32.6 (L) 02/03/2019   MCV 92.1 02/03/2019   PLT 78 (L) 02/03/2019      Chemistry      Component Value Date/Time   NA 145 (H) 01/30/2019 1532   NA 141 02/23/2017 1237   K 4.2 01/30/2019 1532   K 3.8 02/23/2017 1237   CL 108 (H) 01/30/2019 1532   CO2 24 01/30/2019 1532   CO2 27 02/23/2017 1237   BUN 15 01/30/2019 1532   BUN 18.3 02/23/2017 1237   CREATININE 0.96 01/30/2019 1532   CREATININE 1.2 02/23/2017 1237      Component Value Date/Time   CALCIUM 9.4 01/30/2019 1532   CALCIUM 9.6 02/23/2017 1237   ALKPHOS 99 12/16/2018 1442   ALKPHOS 105 02/23/2017 1237   AST 31 12/16/2018 1442   AST 24 02/23/2017 1237   ALT 18 12/16/2018 1442   ALT 15 02/23/2017 1237   BILITOT 0.8 12/16/2018 1442   BILITOT 0.6 09/24/2017 0824   BILITOT 0.98 02/23/2017 1237      All questions were answered. The patient knows to call the clinic with any problems, questions or concerns. No barriers to learning was detected.  I spent 10 minutes counseling the patient face to face. The total time spent in the appointment was 15 minutes and more than 50% was on counseling and review of test results  [name], MD 02/04/2019 4:22 PM

## 2019-02-04 NOTE — Assessment & Plan Note (Signed)
He has iron deficiency anemia and is taking oral iron supplement daily with meals I recommend him to take iron supplement approximately 30 minutes before meals.  Repeat iron study is pending.  He is not symptomatic I will call him as soon as test results of iron studies are available to determine his next follow-up and whether he needs to increase the dose of oral iron supplement.

## 2019-02-04 NOTE — Assessment & Plan Note (Signed)
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 Even though the CT imaging report from September 2017 did not disclose this, I reviewed the imaging study with the patient extensively which clearly showed mild liver enlargement and splenomegaly. Rarely, autoimmune disorder that could cause pulmonary fibrosis can also cause mild thrombocytopenia. He is not symptomatic.  Previous autoimmune screen, hepatitis C screening and serum vitamin B12 were within normal limits Observe only for now 

## 2019-02-05 ENCOUNTER — Telehealth: Payer: Self-pay | Admitting: Hematology and Oncology

## 2019-02-05 ENCOUNTER — Other Ambulatory Visit: Payer: Self-pay | Admitting: Hematology and Oncology

## 2019-02-05 ENCOUNTER — Telehealth: Payer: Self-pay | Admitting: Hematology

## 2019-02-05 DIAGNOSIS — D539 Nutritional anemia, unspecified: Secondary | ICD-10-CM

## 2019-02-05 DIAGNOSIS — D61818 Other pancytopenia: Secondary | ICD-10-CM

## 2019-02-05 DIAGNOSIS — D509 Iron deficiency anemia, unspecified: Secondary | ICD-10-CM

## 2019-02-05 LAB — IRON AND TIBC
Iron: 41 ug/dL — ABNORMAL LOW (ref 42–163)
Saturation Ratios: 12 % — ABNORMAL LOW (ref 20–55)
TIBC: 343 ug/dL (ref 202–409)
UIBC: 301 ug/dL (ref 117–376)

## 2019-02-05 LAB — FERRITIN: Ferritin: 48 ng/mL (ref 24–336)

## 2019-02-05 NOTE — Telephone Encounter (Signed)
Scheduled appt per 2/19 sch message - per message - sent reminder letter in the mail with appt date and time

## 2019-02-05 NOTE — Telephone Encounter (Signed)
I have reviewed the iron panel with the patient over the telephone. His iron panel has mildly improved.  He is not symptomatic We discussed adding an additional second dose of iron several times a week as tolerated in addition to taking his iron supplement half an hour before meals We discussed the risk and benefits of intravenous iron infusion but for now, he is comfortable to proceed with oral iron supplement only. I plan to repeat iron panel again in 3 months and if we still have made no good progress of replenishing his iron supply, we will consider intravenous iron infusion.  He agreed with the plan of care.

## 2019-02-11 DIAGNOSIS — F0281 Dementia in other diseases classified elsewhere with behavioral disturbance: Secondary | ICD-10-CM | POA: Insufficient documentation

## 2019-02-11 DIAGNOSIS — G4719 Other hypersomnia: Secondary | ICD-10-CM | POA: Insufficient documentation

## 2019-02-11 DIAGNOSIS — G3183 Dementia with Lewy bodies: Secondary | ICD-10-CM | POA: Insufficient documentation

## 2019-02-11 NOTE — Addendum Note (Signed)
Addended by: Larey Seat on: 02/11/2019 06:03 PM   Modules accepted: Orders

## 2019-02-11 NOTE — Procedures (Signed)
PATIENT'S NAME:  [name], [name] DOB:      [birth year]      MR#:    [id number]     DATE OF RECORDING: 02/03/2019  AL REFERRING M.D.:  [name], MD Study Performed:   Baseline Polysomnogram HISTORY:  [name]. is a 78 y.o. male patient with many co-morbidities and followed at [facility] by Dr Jannifer Franklin, MD for Memory disturbance, peripheral neuropathy, gait disturbance He was seen on 01-13-2019 upon referral from Dr. Shelia Media for specific sleepiness related questions.  "All I got to do to sleep is sit down" - and :" I am even able to sleep leaning to a wall".     Dr. Shelia Media was concerned about hypersomnia, persistent while the patient is on CPAP. He has been followed by Dr. Shelia Media for OSA and had been compliantly using CPAP until he encountered difficulties with DME [facility], and failed to receive new supplies. He had not been able to use CPAP since Dec 2019 and noted his already persistent hypersomnia worsening. He has atrial fibrillation , is followed by hem-oncology, has been night active, sometimes confused.  Mrs. Deondrick, who accompanied her husband here today, also injected that her husband lost a lot of weight and that his snoring when not using CPAP has become much more moderate. However she is not sure that he does not still have apnea.  I was also able to review his last compliance report back from November through 6 December when he was unable to use the machine for longer than 3 hours on average.  Most of the nights he had been using the machine for only 3 hours 54 minutes just below the cutoff for compliance.  His residual AHI while using CPAP was 2.5/- the 95th percentile pressure for this AutoSet machine was 10.4 cmH2O .His device is about 76 years old, following a sleep study with [facility]- Dr. Shelia Media.   The patient was seen in the emergency room on 22 November 2018 following a fall.  He is having hallucinations, he may see people or animals in the room, this mainly occurs at nighttime.  He has  excessive daytime drowsiness, he is on CPAP.   The patient endorsed the Epworth Sleepiness Scale at 24/24 points.   The patient's weight 194 pounds with a height of 68 (inches), resulting in a BMI of 29.4 kg/m2. The patient's neck circumference measured 17 inches.  CURRENT MEDICATIONS: Zyloprim, Ferrous sulfate, Lasix, Lopressor, Myrbetriq, Protonix, Klor-con, Crestor, Zoloft, Flomax.   PROCEDURE:  This is a multichannel digital polysomnogram utilizing the Somnostar 11.2 system.  Electrodes and sensors were applied and monitored per AASM Specifications.   EEG, EOG, Chin and Limb EMG, were sampled at 200 Hz.  ECG, Snore and Nasal Pressure, Thermal Airflow, Respiratory Effort, CPAP Flow and Pressure, Oximetry was sampled at 50 Hz. Digital video and audio were recorded.      BASELINE STUDY: Lights Out was at 21:06 and Lights On at 04:58.  Total recording time (TRT) was 472.5 minutes, with a total sleep time (TST) of 416.5 minutes.   The patient's sleep latency was 4 minutes.  REM latency was 356 minutes.  The sleep efficiency was 88.1 %.     SLEEP ARCHITECTURE: WASO (Wake after sleep onset) was 51.5 minutes.  There were 5 minutes in Stage N1, 347 minutes Stage N2, 43 minutes Stage N3 and 21.5 minutes in Stage REM.  The percentage of Stage N1 was 1.2%, Stage N2 was 83.3%, Stage N3 was  10.3% and Stage R (REM sleep) was 5.2%.    RESPIRATORY ANALYSIS:  There were a total of 269 respiratory events:  22 obstructive apneas, 15 central apneas and 0 mixed apneas with a total of 37 apneas and an apnea index (AI) of 5.3 /hour. There were 232 hypopneas with a hypopnea index of 33.4 /hour. The patient also had 0 respiratory event related arousals (RERAs).     The total APNEA/HYPOPNEA INDEX (AHI) was 38.8/hour and the total RESPIRATORY DISTURBANCE INDEX was 38.8 /hour.  22 events occurred in REM sleep and 443 events in NREM. The REM AHI was 61.4 /hour, versus a non-REM AHI of 37.5. The patient spent 360.5 minutes of  total sleep time in the supine position and 56 minutes in non-supine. The supine AHI was 43.6 versus a non-supine AHI of 7.5.  OXYGEN SATURATION & C02:  The Wake baseline 02 saturation was 94%, with the lowest being 70%. Time spent below 89% saturation equaled 124 minutes.   AROUSALS: The arousals were noted as: 52 were spontaneous, 1 associated with PLMs, and 55 were associated with respiratory events. The patient had a total of 38 Periodic Limb Movements.  The Periodic Limb Movement (PLM) index was 5.5 and the PLM Arousal index was 0.1/hour. Audio and video analysis did not show a restless patient. Nocturia times one. No abnormal or unusual movements, behaviors, phonations or vocalizations.  Loud Snoring was noted. EKG was in keeping with paced rhythm .  IMPRESSION: 1. Severe Complex apnea, the predominant form is Obstructive Sleep Apnea (OSA), with severe and clinically significant hypoxemia.  Loud snoring.  2. Confusional Arousals - restless, agitated.  3. Very reduced REM sleep. Slowed EEG.       RECOMMENDATIONS: Advise full-night, attended, PAP titration study to optimize therapy.     I certify that I have reviewed the entire raw data recording prior to the issuance of this report in accordance with the Standards of Accreditation of the American Academy of Sleep Medicine (AASM)     Larey Seat, MD    02-11-2019 Diplomat, American Board of Psychiatry and Neurology  Diplomat, American Board of [place] Director, [facility] [employer]

## 2019-02-12 DIAGNOSIS — C44329 Squamous cell carcinoma of skin of other parts of face: Secondary | ICD-10-CM | POA: Diagnosis not present

## 2019-02-12 DIAGNOSIS — D485 Neoplasm of uncertain behavior of skin: Secondary | ICD-10-CM | POA: Diagnosis not present

## 2019-02-12 DIAGNOSIS — L570 Actinic keratosis: Secondary | ICD-10-CM | POA: Diagnosis not present

## 2019-02-12 DIAGNOSIS — L02212 Cutaneous abscess of back [any part, except buttock]: Secondary | ICD-10-CM | POA: Diagnosis not present

## 2019-02-12 DIAGNOSIS — L720 Epidermal cyst: Secondary | ICD-10-CM | POA: Diagnosis not present

## 2019-02-12 DIAGNOSIS — L821 Other seborrheic keratosis: Secondary | ICD-10-CM | POA: Diagnosis not present

## 2019-02-12 DIAGNOSIS — D692 Other nonthrombocytopenic purpura: Secondary | ICD-10-CM | POA: Diagnosis not present

## 2019-02-17 ENCOUNTER — Telehealth: Payer: Self-pay | Admitting: Neurology

## 2019-02-17 NOTE — Telephone Encounter (Signed)

## 2019-02-17 NOTE — Telephone Encounter (Signed)
-----   Message from Larey Seat, MD sent at 02/11/2019  6:03 PM EST ----- IMPRESSION: 1. Severe Complex apnea, the predominant form is Obstructive  Sleep Apnea (OSA), with severe and clinically significant  hypoxemia. Loud snoring.  2. Confusional Arousals - restless, agitated.  3. Very reduced REM sleep. There were still movements noted in REM sleep - this is likely a sign for REM BD being present. Slowed EEG. Paced EKG.      RECOMMENDATIONS: Advise full-night, attended, PAP titration study  to optimize therapy.

## 2019-02-20 DIAGNOSIS — L988 Other specified disorders of the skin and subcutaneous tissue: Secondary | ICD-10-CM | POA: Diagnosis not present

## 2019-02-20 DIAGNOSIS — C44329 Squamous cell carcinoma of skin of other parts of face: Secondary | ICD-10-CM | POA: Diagnosis not present

## 2019-02-20 DIAGNOSIS — Z85828 Personal history of other malignant neoplasm of skin: Secondary | ICD-10-CM | POA: Diagnosis not present

## 2019-03-04 ENCOUNTER — Telehealth: Payer: Self-pay

## 2019-03-04 NOTE — Telephone Encounter (Signed)
Told Ms Milliron that her husband's appointment has been r/s to to 05-01-19 at [facility] for lab. Visit with Dr. [name] is 05-08-19 at 0900 due to the Coronavirus. Ms Snapp verbalized understanding.

## 2019-03-11 ENCOUNTER — Telehealth: Payer: Self-pay

## 2019-03-11 DIAGNOSIS — G4733 Obstructive sleep apnea (adult) (pediatric): Secondary | ICD-10-CM

## 2019-03-11 NOTE — Addendum Note (Signed)
Addended by: Darleen Crocker on: 03/11/2019 05:05 PM   Modules accepted: Orders

## 2019-03-11 NOTE — Telephone Encounter (Signed)
You have ordered a CPAP study for patient. Due to sleep lab being closed due to Covid 19, would you like to order auto CPAP at home? If so, let sleep lab know to cancel in lab titration study and then you can order auto CPAP for patient. Thanks! 

## 2019-03-11 NOTE — Telephone Encounter (Signed)
Dr Brett Fairy has reviewed the [facility] and she suggest that the patient starts Auto CPAP 6-16 cm water pressure with 3 cm EPR. She will also want the patient to complete ONO within 30 days of starting CPAP which will be on the order. Order will be placed. Please inform the patient of the auto CPAP and advise the pt that he will have to have a follow up office visit within 31-90 days of starting the CPAP. He can call and schedule this apt with Dr Brett Fairy, Ward Givens, Janett Billow or Amy NP.  Order will be sent to [facility] unless the patient prefers someone different. I will send a letter in the mail to patient as a reminder of all this once it has been discussed and sent back to me to send the order off.

## 2019-03-13 ENCOUNTER — Encounter: Payer: Self-pay | Admitting: Neurology

## 2019-03-13 NOTE — Telephone Encounter (Signed)
Our sleep lab technician called the patient to advise this, no answer. LVM for the pt to call back. I will go ahead and send the orders to [facility] for them to get the process started.   If patient calls back please schedule apt our 60-90  Days with MD, Elita Boone, Amy NP.

## 2019-03-17 DIAGNOSIS — M109 Gout, unspecified: Secondary | ICD-10-CM | POA: Diagnosis not present

## 2019-03-17 DIAGNOSIS — N189 Chronic kidney disease, unspecified: Secondary | ICD-10-CM | POA: Diagnosis not present

## 2019-03-17 DIAGNOSIS — L409 Psoriasis, unspecified: Secondary | ICD-10-CM | POA: Diagnosis not present

## 2019-03-17 DIAGNOSIS — L4050 Arthropathic psoriasis, unspecified: Secondary | ICD-10-CM | POA: Diagnosis not present

## 2019-03-17 DIAGNOSIS — D696 Thrombocytopenia, unspecified: Secondary | ICD-10-CM | POA: Diagnosis not present

## 2019-03-17 DIAGNOSIS — D649 Anemia, unspecified: Secondary | ICD-10-CM | POA: Diagnosis not present

## 2019-03-17 DIAGNOSIS — Z79899 Other long term (current) drug therapy: Secondary | ICD-10-CM | POA: Diagnosis not present

## 2019-03-17 DIAGNOSIS — R202 Paresthesia of skin: Secondary | ICD-10-CM | POA: Diagnosis not present

## 2019-03-17 DIAGNOSIS — M1990 Unspecified osteoarthritis, unspecified site: Secondary | ICD-10-CM | POA: Diagnosis not present

## 2019-03-31 ENCOUNTER — Telehealth: Payer: Self-pay | Admitting: *Deleted

## 2019-03-31 ENCOUNTER — Telehealth (INDEPENDENT_AMBULATORY_CARE_PROVIDER_SITE_OTHER): Payer: Medicare Other | Admitting: Cardiology

## 2019-03-31 DIAGNOSIS — R600 Localized edema: Secondary | ICD-10-CM | POA: Diagnosis not present

## 2019-03-31 MED ORDER — METOPROLOL TARTRATE 50 MG PO TABS: 75.0000 mg | ORAL_TABLET | Freq: Two times a day (BID) | ORAL | 3 refills | Status: DC

## 2019-03-31 MED ORDER — FUROSEMIDE 40 MG PO TABS: 80.0000 mg | ORAL_TABLET | Freq: Two times a day (BID) | ORAL | 3 refills | Status: DC

## 2019-03-31 NOTE — Patient Instructions (Signed)
Medication Instructions:  INCREASE FUROSEMIDE TO 80 MG TWICE DAILY= 2 OF THE 40 MG TABLETS TWICE DAILY  INCREASE METOPROLOL TO 75 MG TWICE DAILY= 1 AND 1/2 OF THE 50 MG TABLET TWICE DAILY If you need a refill on your cardiac medications before your next appointment, please call your pharmacy.   Lab work: Your physician recommends that you return for lab work in: [place] If you have labs (blood work) drawn today and your tests are completely normal, you will receive your results only by: [name] MyChart Message (if you have MyChart) OR . A paper copy in the mail If you have any lab test that is abnormal or we need to change your treatment, we will call you to review the results.  Follow-Up: At [facility], you and your health needs are our priority.  As part of our continuing mission to provide you with exceptional heart care, we have created designated Provider Care Teams.  These Care Teams include your primary Cardiologist (physician) and Advanced Practice Providers (APPs -  Physician Assistants and Nurse Practitioners) who all work together to provide you with the care you need, when you need it. Your physician recommends that you schedule a follow-up appointment in: [place]

## 2019-03-31 NOTE — Progress Notes (Signed)
Telehealth visit (patient interviewed with phone as they were technical difficulties arranging video images)  Evaluation Performed:  Follow-up visit  Date:  03/31/2019   ID:  Jamie Singleton., DOB [birth year], MRN [id number]  Pt location: home  Provider location: office  PCP:  [name], MD  Cardiologist:  Kirk Ruths, MD    Chief Complaint:  CAD and atrial fibrillation  History of Present Illness:    Jamie Greogory Cornette. is a 78 y.o. male who presents via audio/video conferencing for a telehealth visit today.    FU CAD and atrial fibrillation. He underwent CABG (LIMA-LAD, SVG-OM 2, SVG-PDA) along with modified Cox Maze IV procedure in 12/2007. 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 Abdominal US 3/12: No aneurysm. 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 He underwent craniotomy and evacuation by Dr. Saintclair Halsted. He was taken off of Coumadin and no longer felt to be an anticoagulation candidate.Multaq DCed previously as felt causing pulmonary toxixity. Nuclear study 5/18 showed EF 59 with normal perfusion.Echocardiogram December 2018 showed normal LV function, mild left ventricular hypertrophy, mild left atrial enlargement,mild to moderate tricuspid regurgitation and mild pulmonary hypertension. Carotid Dopplers January 2019 showed 1 to 39% bilateral stenosis. There was greater than 50% right external carotid artery stenosis.   Patient presently being evaluated for possible dementia and also pancytopenia and monoclonal antibody IgA.  At last office visit patient was volume overloaded and we increased his diuretic.  Echocardiogram repeated January 2020 and showed ejection fraction 60 to 65%, mild mitral regurgitation, severe right ventricular enlargement with reduced function, severe tricuspid regurgitation and mild to moderate pulmonary hypertension.  Since last seen,he has dyspnea with more prolonged activities.  No  orthopnea or PND.  He continues with pedal edema with some improvement compared to previous visit.  The patient does not have symptoms concerning for COVID-19 infection (fever, chills, cough, or new shortness of breath).    Past Medical History:  Diagnosis Date  . (HFpEF) heart failure with preserved ejection fraction ([place])    a. 05/2013 Echo: EF 55%, mild LVH, diast dysfxn, Ao sclerosis, mildly dil LA, RV dysfxn (poorly visualized), PASP 71mmHg;  b. 03/2017 Echo: EF 55-60%, no rwma, triv MR, mildly dil RV, mod TR, PASP 73mmHg.  . Atrial fibrillation [facility])    s/p Cox Maze 1/09;  Multaq Rx d/c'd in 2014 due to pulmo fibrosis;  coumadin d/c'd in 2014 due to spontaneous subdural hematoma  . BPH (benign prostatic hyperplasia)   . CAD (coronary artery disease), native coronary artery    a. s/p CABG 12/2007;  b. Myoview 12/2011: EF 66%, no scar or ischemia; normal.  . DM (diabetes mellitus) ([place])   . Hyperlipidemia type II   . Hypertension   . 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 OSA (obstructive sleep apnea)   . Pacemaker    PPM - [employer]  . Peripheral neuropathy 07/31/2018  . Pulmonary fibrosis ([place])    Multaq d/c'd 7/14  . Subdural hematoma ([place]) 07/2012   spontaneous;  coumadin d/c'd => no longer a candidate for anticoagulation   Past Surgical History:  Procedure Laterality Date  . APPENDECTOMY    . CHOLECYSTECTOMY    . CORONARY ARTERY BYPASS GRAFT     x3 (left internal mammary artery to distal left anterior descending coronary artery, saphenous vain graft to second circumflex marginal branch, saphenous vain graft to posterior descending coronary artery, endoscopic saphenous vain harvest from right  thigh) and modified Cox - Maze IV procedure.  [name],MD. Electronically signed CHO/MEDQ D: 01/09/2008/ JOB: [id number] cc:  [name]  . [name]  07/30/2012   Procedure: CRANIOTOMY HEMATOMA EVACUATION SUBDURAL;  Surgeon: [name], MD;   Location: [facility];  Service: Neurosurgery;  Laterality: Right;  Right craniotomy for evacuation of subdural hematoma  . FOOT SURGERY    . HERNIA REPAIR    . ORCHIECTOMY     Left  /  testicular cancer  . PACEMAKER PLACEMENT     PPM - [employer] Jude     Current Meds  Medication Sig  . allopurinol (ZYLOPRIM) 300 MG tablet Take 1 tablet by mouth daily.  . colchicine 0.6 MG tablet Take 0.6-1.2 mg by mouth See admin instructions. Take 1.2 mg by mouth then 0.6 mg one hour later as needed for gout flares/ as directed.  . ferrous sulfate 325 (65 FE) MG tablet Take 325 mg by mouth daily with breakfast.  . furosemide (LASIX) 40 MG tablet Take 1.5 tablets (60 mg total) by mouth 2 (two) times daily.  . metoprolol tartrate (LOPRESSOR) 50 MG tablet Take 1 tablet (50 mg total) by mouth 2 (two) times daily.  . mirabegron ER (MYRBETRIQ) 50 MG TB24 tablet Take 1 tablet (50 mg total) by mouth daily.  . pantoprazole (PROTONIX) 40 MG tablet Take 40 mg by mouth daily.  . potassium chloride SA (K-DUR,KLOR-CON) 20 MEQ tablet Take 20 mEq by mouth daily.    . rosuvastatin (CRESTOR) 40 MG tablet Take 1 tablet (40 mg total) by mouth daily.  . sertraline (ZOLOFT) 100 MG tablet Take 200 mg by mouth daily.   . Tamsulosin HCl (FLOMAX) 0.4 MG CAPS Take 0.4 mg by mouth at bedtime.      Allergies:   Lipitor [atorvastatin]; Nsaids; Warfarin and related; Allopurinol; and Enbrel [etanercept]   Social History   Tobacco Use  . Smoking status: Former Smoker    Last attempt to quit: 02/21/1991    Years since quitting: 28.1  . Smokeless tobacco: Never Used  Substance Use Topics  . Alcohol use: No    Alcohol/week: 0.0 standard drinks  . Drug use: No     Family Hx: The patient's family history includes Alzheimer's disease in his mother; Dementia in his mother; Heart attack in his father; Heart disease in his father and mother; Heart failure in his father.  ROS:   Please see the history of present illness.    No F/C or  productive cough All other systems reviewed and are negative.   Labs/Other Tests and Data Reviewed:     Recent Labs: 12/16/2018: ALT 18; TSH 2.385 01/30/2019: BUN 15; Creatinine, Ser 0.96; Potassium 4.2; Sodium 145 02/03/2019: Hemoglobin 10.3; Platelets 78   Recent Lipid Panel Lab Results  Component Value Date/Time   CHOL 119 09/24/2017 08:24 AM   TRIG 94 09/24/2017 08:24 AM   HDL 42 09/24/2017 08:24 AM   CHOLHDL 2.8 09/24/2017 08:24 AM   CHOLHDL 4 12/01/2013 08:43 AM   LDLCALC 58 09/24/2017 08:24 AM   LDLDIRECT 154.2 10/20/2013 08:46 AM    Wt Readings from Last 3 Encounters:  03/31/19 205 lb (93 kg)  02/04/19 199 lb 3.2 oz (90.4 kg)  01/13/19 194 lb (88 kg)     Objective:    Vital Signs:  BP 124/90 (BP Location: Left Arm, Patient Position: Sitting, Cuff Size: Large)   Pulse (!) 109   Ht 5\' 8"  (1.727 m)  Wt 205 lb (93 kg)   BMI 31.17 kg/m    Exam not performed as this is a tele-visit due to coronavirus pandemic.  Patient appears to be in no acute distress.  ASSESSMENT & PLAN:    1. Chronic diastolic congestive heart failure-patient continues to have edema though some improvement.  I will increase Lasix to 80 mg twice daily.  Check potassium and renal function in 1 week.  Some of volume excess likely related to tricuspid regurgitation. 2. Severe tricuspid regurgitation-this is new compared to previous.  Question related to previous pacemaker.  May need transesophageal echocardiogram in the future to further assess once coronavirus pandemic over. 3. Coronary artery disease-patient denies chest pain.  Continue statin.  Patient is not on aspirin given history of spontaneous intracranial hemorrhage. 4. Paroxysmal atrial fibrillation-patient's heart rate elevated today.  This is a tele-visit due to coronavirus pandemic.  Therefore cannot tell if patient has had recurrent atrial fibrillation as no ECG performed.  I will increase metoprolol to 75 mg twice daily.  He is not on  aspirin or anticoagulation due to history of spontaneous intracranial hemorrhage. 5. Hypertension-diastolic blood pressure is mildly elevated.  Increase metoprolol to 75 mg twice daily and follow. 6. Hyperlipidemia-continue statin. 7. Prior pacemaker-he has not seen Dr. Lovena Le in quite some time.  We will arrange follow-up in the near future.  COVID-19 Education: The signs and symptoms of COVID-19 were discussed with the patient and how to seek care for testing (follow up with PCP or arrange E-visit).  The importance of social distancing was discussed today.  Time:   Today, I have spent 25 minutes with the patient with telehealth technology discussing the above problems.     Medication Adjustments/Labs and Tests Ordered: Current medicines are reviewed at length with the patient today.  Concerns regarding medicines are outlined above.  Tests Ordered: No orders of the defined types were placed in this encounter.  Medication Changes: No orders of the defined types were placed in this encounter.   Disposition:  Follow up 6 months  Signed, Kirk Ruths, MD  03/31/2019 11:38 AM    [place]

## 2019-03-31 NOTE — Telephone Encounter (Signed)
   Cardiac Questionnaire:    Since your last visit or hospitalization:    1. Have you been having new or worsening chest pain? NO   2. Have you been having new or worsening shortness of breath? NO 3. Have you been having new or worsening leg swelling, wt gain, or increase in abdominal girth (pants fitting more tightly)? YES   4. Have you had any passing out spells? No, but patient has been having balance problems.    *A YES to any of these questions would result in the appointment being kept. *If all the answers to these questions are NO, we should indicate that given the current situation regarding the worldwide coronarvirus pandemic, at the recommendation of the CDC, we are looking to limit gatherings in our waiting area, and thus will reschedule their appointment beyond four weeks from today.   _____________   [id number] Pre-Screening Questions:  . Do you currently have a fever? NO . Have you recently travelled on a cruise, internationally, or to [place], [place], [place], [place], [place], or [place], [place] [employer])? NO . Have you been in contact with someone that is currently pending confirmation of Covid19 testing or has been confirmed to have the [place] virus? NO . Are you currently experiencing fatigue or cough? NO

## 2019-04-18 ENCOUNTER — Other Ambulatory Visit: Payer: Self-pay | Admitting: Neurology

## 2019-04-30 ENCOUNTER — Other Ambulatory Visit: Payer: Medicare Other

## 2019-05-01 ENCOUNTER — Ambulatory Visit: Payer: Medicare Other | Admitting: Hematology and Oncology

## 2019-05-01 ENCOUNTER — Inpatient Hospital Stay: Payer: Medicare Other | Attending: Hematology and Oncology

## 2019-05-01 ENCOUNTER — Other Ambulatory Visit: Payer: Self-pay

## 2019-05-01 DIAGNOSIS — K7460 Unspecified cirrhosis of liver: Secondary | ICD-10-CM | POA: Diagnosis not present

## 2019-05-01 DIAGNOSIS — D472 Monoclonal gammopathy: Secondary | ICD-10-CM | POA: Diagnosis not present

## 2019-05-01 DIAGNOSIS — D61818 Other pancytopenia: Secondary | ICD-10-CM | POA: Insufficient documentation

## 2019-05-01 DIAGNOSIS — R296 Repeated falls: Secondary | ICD-10-CM | POA: Insufficient documentation

## 2019-05-01 DIAGNOSIS — D539 Nutritional anemia, unspecified: Secondary | ICD-10-CM

## 2019-05-01 DIAGNOSIS — Z79899 Other long term (current) drug therapy: Secondary | ICD-10-CM | POA: Insufficient documentation

## 2019-05-01 DIAGNOSIS — D509 Iron deficiency anemia, unspecified: Secondary | ICD-10-CM

## 2019-05-01 LAB — CBC WITH DIFFERENTIAL/PLATELET
Abs Immature Granulocytes: 0.01 10*3/uL (ref 0.00–0.07)
Basophils Absolute: 0 10*3/uL (ref 0.0–0.1)
Basophils Relative: 1 %
Eosinophils Absolute: 0.1 10*3/uL (ref 0.0–0.5)
Eosinophils Relative: 2 %
HCT: 32.8 % — ABNORMAL LOW (ref 39.0–52.0)
Hemoglobin: 10.1 g/dL — ABNORMAL LOW (ref 13.0–17.0)
Immature Granulocytes: 0 %
Lymphocytes Relative: 17 %
Lymphs Abs: 0.7 10*3/uL (ref 0.7–4.0)
MCH: 28.7 pg (ref 26.0–34.0)
MCHC: 30.8 g/dL (ref 30.0–36.0)
MCV: 93.2 fL (ref 80.0–100.0)
Monocytes Absolute: 0.3 10*3/uL (ref 0.1–1.0)
Monocytes Relative: 7 %
Neutro Abs: 3 10*3/uL (ref 1.7–7.7)
Neutrophils Relative %: 73 %
Platelets: 81 10*3/uL — ABNORMAL LOW (ref 150–400)
RBC: 3.52 MIL/uL — ABNORMAL LOW (ref 4.22–5.81)
RDW: 16.2 % — ABNORMAL HIGH (ref 11.5–15.5)
WBC: 4.2 10*3/uL (ref 4.0–10.5)
nRBC: 0 % (ref 0.0–0.2)

## 2019-05-01 LAB — COMPREHENSIVE METABOLIC PANEL
ALT: 18 U/L (ref 0–44)
AST: 29 U/L (ref 15–41)
Albumin: 3.7 g/dL (ref 3.5–5.0)
Alkaline Phosphatase: 90 U/L (ref 38–126)
Anion gap: 8 (ref 5–15)
BUN: 35 mg/dL — ABNORMAL HIGH (ref 8–23)
CO2: 28 mmol/L (ref 22–32)
Calcium: 9.1 mg/dL (ref 8.9–10.3)
Chloride: 102 mmol/L (ref 98–111)
Creatinine, Ser: 1.73 mg/dL — ABNORMAL HIGH (ref 0.61–1.24)
GFR calc Af Amer: 43 mL/min — ABNORMAL LOW (ref >60)
GFR calc non Af Amer: 38 mL/min — ABNORMAL LOW (ref >60)
Glucose, Bld: 97 mg/dL (ref 70–99)
Potassium: 4.6 mmol/L (ref 3.5–5.1)
Sodium: 138 mmol/L (ref 135–145)
Total Bilirubin: 0.6 mg/dL (ref 0.3–1.2)
Total Protein: 7.8 g/dL (ref 6.5–8.1)

## 2019-05-01 LAB — IRON AND TIBC
Iron: 39 ug/dL — ABNORMAL LOW (ref 42–163)
Saturation Ratios: 11 % — ABNORMAL LOW (ref 20–55)
TIBC: 350 ug/dL (ref 202–409)
UIBC: 311 ug/dL (ref 117–376)

## 2019-05-01 LAB — FERRITIN: Ferritin: 64 ng/mL (ref 24–336)

## 2019-05-01 LAB — SEDIMENTATION RATE: Sed Rate: 40 mm/hr — ABNORMAL HIGH (ref 0–16)

## 2019-05-07 ENCOUNTER — Ambulatory Visit: Payer: Medicare Other | Admitting: Hematology

## 2019-05-08 ENCOUNTER — Inpatient Hospital Stay (HOSPITAL_BASED_OUTPATIENT_CLINIC_OR_DEPARTMENT_OTHER): Payer: Medicare Other | Admitting: Hematology and Oncology

## 2019-05-08 ENCOUNTER — Other Ambulatory Visit: Payer: Self-pay

## 2019-05-08 DIAGNOSIS — R296 Repeated falls: Secondary | ICD-10-CM

## 2019-05-08 DIAGNOSIS — D61818 Other pancytopenia: Secondary | ICD-10-CM | POA: Diagnosis not present

## 2019-05-08 DIAGNOSIS — K7460 Unspecified cirrhosis of liver: Secondary | ICD-10-CM

## 2019-05-08 DIAGNOSIS — D472 Monoclonal gammopathy: Secondary | ICD-10-CM

## 2019-05-08 DIAGNOSIS — I2510 Atherosclerotic heart disease of native coronary artery without angina pectoris: Secondary | ICD-10-CM

## 2019-05-08 DIAGNOSIS — Z79899 Other long term (current) drug therapy: Secondary | ICD-10-CM | POA: Diagnosis not present

## 2019-05-08 DIAGNOSIS — G4719 Other hypersomnia: Secondary | ICD-10-CM

## 2019-05-09 ENCOUNTER — Encounter: Payer: Self-pay | Admitting: Hematology and Oncology

## 2019-05-09 NOTE — Assessment & Plan Note (Signed)
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

## 2019-05-09 NOTE — Assessment & Plan Note (Signed)
The patient have coronary artery disease He has leg edema I recommend close follow-up with cardiologist for medication adjustment if needed

## 2019-05-09 NOTE — Assessment & Plan Note (Signed)
The patient has CPAP machine and has been diagnosed with obstructive sleep apnea He average about 4 hours of sleep His wife had numerous concerns regarding his wellbeing I recommend close follow-up with his sleep medicine physician and possibly adjustment of his CPAP machine if needed

## 2019-05-09 NOTE — Progress Notes (Signed)
[place] OFFICE PROGRESS NOTE  Patient Care Team: [name], [name], [name], [name], [name], DO as Referring Physician (Family Medicine)  ASSESSMENT & PLAN:  [name] (monoclonal gammopathy of unknown significance) His last myeloma panel was just borderline abnormal None of his current signs or symptoms are related to this I will see him back next year for further follow-up with blood to be drawn a week prior to visit  Pancytopenia, acquired ([place]) The pancytopenia is stable His recent iron studies are satisfactory His anemia is likely anemia of chronic disease due to chronic renal failure 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  Excessive daytime sleepiness The patient has CPAP machine and has been diagnosed with obstructive sleep apnea He average about 4 hours of sleep His wife had numerous concerns regarding his wellbeing I recommend close follow-up with his sleep medicine physician and possibly adjustment of his CPAP machine if needed  CAD (coronary artery disease), native coronary artery The patient have coronary artery disease He has leg edema I recommend close follow-up with cardiologist for medication adjustment if needed   Orders Placed This Encounter  Procedures  . Comprehensive metabolic panel    Standing Status:   Future    Standing Expiration Date:   06/12/2020  . CBC with Differential/Platelet    Standing Status:   Future    Standing Expiration Date:   06/12/2020  . Kappa/lambda light chains    Standing Status:    Future    Standing Expiration Date:   06/12/2020  . Multiple Myeloma Panel (SPEP&IFE w/QIG)    Standing Status:   Future    Standing Expiration Date:   06/12/2020    INTERVAL HISTORY: Please see below for problem oriented charting. He returns for further follow-up His wife requested a phone call during the visit The patient had recent fall yesterday morning He felt that he treatment for and has significant bruises but denies syncopal episode or significant injuries He has been profoundly fatigued He uses his sleep apnea machine for approximately 4 hours His wife noted excessive sleepiness and daytime somnolence His wife is also complaining about his profound leg swelling and inability to get into his shoes due to that He denies recent infection, fever or chills No recent cough The patient denies any recent signs or symptoms of bleeding such as spontaneous epistaxis, hematuria or hematochezia. His wife also noted he has very poor memory  SUMMARY OF ONCOLOGIC HISTORY: [name]. is here because of thrombocytopenia. 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  He was found to have abnormal CBC from abnormal blood work from routine blood work monitoring. I have reviewed outside records from his primary care doctor.  He is noted to have mild pancytopenia since 11/17/2015. On 11/17/2015, white blood cell count 5.9, hemoglobin 12.7 and platelet count 106 On 03/13/2016, white blood cell count 6.6, hemoglobin 12.6 and platelet count 103 On 09/06/2015, white blood cell count 6.6, hemoglobin 11.9 and platelet count 102 On 10/25/2016,  white count 5.2, hemoglobin 12.7 and platelet count 86 On 11/08/16, white blood cell count 5.3, hemoglobin 12.2 implant, 107. On 09/05/2016, serum ferritin was high at 220, iron study showed serum iron low at 24 and 9% iron saturation.  Some older scanned  records dated back to 12/13/2011 showed low platelet count of 101 On 12/21/2011, he had normal platelet count of 210 On 07/30/2012, his platelet count was borderline low at 143  On 09/08/2016, [name], such as spontaneous epistaxis, hematuria, 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  He is being observed Repeat CT imaging of the abdomen and pelvis in August 2019 confirmed liver cirrhosis and splenomegaly He was found to have iron deficiency anemia and was placed on oral iron supplement  REVIEW OF SYSTEMS:   Constitutional: Denies fevers, chills or abnormal weight loss Eyes: Denies blurriness of vision Ears, nose, mouth, throat, and face: Denies mucositis or sore throat Respiratory: Denies cough, dyspnea or wheezes Cardiovascular: Denies palpitation, chest discomfort  Gastrointestinal:  Denies nausea, heartburn or change in bowel habits Skin: Denies abnormal skin rashes Lymphatics: Denies new lymphadenopathy or easy bruising Neurological:Denies numbness, tingling or new weaknesses Behavioral/Psych: Mood is stable, no new changes  All other systems were reviewed with the patient and are negative.  I have reviewed the past medical history, past surgical history, social history and family history with the patient and they are unchanged from  previous note.  ALLERGIES:  is allergic to lipitor [atorvastatin]; nsaids; warfarin and related; allopurinol; and enbrel [etanercept].  MEDICATIONS:  Current Outpatient Medications  Medication Sig Dispense Refill  . allopurinol (ZYLOPRIM) 300 MG tablet Take 1 tablet by mouth daily.    . colchicine 0.6 MG tablet Take 0.6-1.2 mg by mouth See admin instructions. Take 1.2 mg by mouth then 0.6 mg one hour later as needed for gout flares/ as directed.  1  . ferrous sulfate 325 (65 FE) MG tablet Take 325 mg by mouth daily with breakfast.    . furosemide (LASIX) 40 MG tablet Take 2 tablets (80 mg total) by mouth 2 (two) times daily. 360 tablet 3  . metoprolol tartrate (LOPRESSOR) 50 MG tablet Take 1.5 tablets (75 mg total) by mouth 2 (two) times daily. 270 tablet 3  . MYRBETRIQ 50 MG TB24 tablet TAKE 1 TABLET BY MOUTH EVERY DAY 30 tablet 3  . pantoprazole (PROTONIX) 40 MG tablet Take 40 mg by mouth daily.    . potassium chloride SA (K-DUR,KLOR-CON) 20 MEQ tablet Take 20 mEq by mouth daily.      . rosuvastatin (CRESTOR) 40 MG tablet Take 1 tablet (40 mg total) by mouth daily. 90 tablet 2  . sertraline (ZOLOFT) 100 MG tablet Take 200 mg by mouth daily.     . Tamsulosin HCl (FLOMAX) 0.4 MG CAPS Take 0.4 mg by mouth at bedtime.      No current facility-administered medications for  this visit.     PHYSICAL EXAMINATION: ECOG PERFORMANCE STATUS: 2 - Symptomatic, <50% confined to bed  Vitals:   05/08/19 0936  BP: (!) 131/53  Pulse: (!) 116  Resp: 18  Temp: 97.9 F (36.6 C)  SpO2: 97%   Filed Weights   05/08/19 0936  Weight: 204 lb 12.8 oz (92.9 kg)    GENERAL:alert, no distress and comfortable SKIN: skin color, texture, turgor are normal, no rashes or significant lesions.  Noted skin bruises EYES: normal, Conjunctiva are pink and non-injected, sclera clear OROPHARYNX:no exudate, no erythema and lips, buccal mucosa, and tongue normal  NECK: supple, thyroid normal size, non-tender, without  nodularity LYMPH:  no palpable lymphadenopathy in the cervical, axillary or inguinal LUNGS: Stable crackles at the lung bases from previously noted fibrosis due to radiation exposure HEART: regular rate & rhythm and no murmurs with moderate bilateral lower extremity edema ABDOMEN:abdomen soft, non-tender and normal bowel sounds Musculoskeletal:no cyanosis of digits and no clubbing  NEURO: alert & oriented x 3 with fluent speech, no focal motor/sensory deficits.  Noted poor gait  LABORATORY DATA:  I have reviewed the data as listed    Component Value Date/Time   NA 138 05/01/2019 0929   NA 145 (H) 01/30/2019 1532   NA 141 02/23/2017 1237   K 4.6 05/01/2019 0929   K 3.8 02/23/2017 1237   CL 102 05/01/2019 0929   CO2 28 05/01/2019 0929   CO2 27 02/23/2017 1237   GLUCOSE 97 05/01/2019 0929   GLUCOSE 113 02/23/2017 1237   BUN 35 (H) 05/01/2019 0929   BUN 15 01/30/2019 1532   BUN 18.3 02/23/2017 1237   CREATININE 1.73 (H) 05/01/2019 0929   CREATININE 1.2 02/23/2017 1237   CALCIUM 9.1 05/01/2019 0929   CALCIUM 9.6 02/23/2017 1237   PROT 7.8 05/01/2019 0929   PROT 7.3 06/18/2018 1205   PROT 8.0 02/23/2017 1237   ALBUMIN 3.7 05/01/2019 0929   ALBUMIN 4.1 09/24/2017 0824   ALBUMIN 4.1 02/23/2017 1237   AST 29 05/01/2019 0929   AST 24 02/23/2017 1237   ALT 18 05/01/2019 0929   ALT 15 02/23/2017 1237   ALKPHOS 90 05/01/2019 0929   ALKPHOS 105 02/23/2017 1237   BILITOT 0.6 05/01/2019 0929   BILITOT 0.6 09/24/2017 0824   BILITOT 0.98 02/23/2017 1237   GFRNONAA 38 (L) 05/01/2019 0929   GFRAA 43 (L) 05/01/2019 0929    No results found for: SPEP, UPEP  Lab Results  Component Value Date   WBC 4.2 05/01/2019   NEUTROABS 3.0 05/01/2019   HGB 10.1 (L) 05/01/2019   HCT 32.8 (L) 05/01/2019   MCV 93.2 05/01/2019   PLT 81 (L) 05/01/2019      Chemistry      Component Value Date/Time   NA 138 05/01/2019 0929   NA 145 (H) 01/30/2019 1532   NA 141 02/23/2017 1237   K 4.6  05/01/2019 0929   K 3.8 02/23/2017 1237   CL 102 05/01/2019 0929   CO2 28 05/01/2019 0929   CO2 27 02/23/2017 1237   BUN 35 (H) 05/01/2019 0929   BUN 15 01/30/2019 1532   BUN 18.3 02/23/2017 1237   CREATININE 1.73 (H) 05/01/2019 0929   CREATININE 1.2 02/23/2017 1237      Component Value Date/Time   CALCIUM 9.1 05/01/2019 0929   CALCIUM 9.6 02/23/2017 1237   ALKPHOS 90 05/01/2019 0929   ALKPHOS 105 02/23/2017 1237   AST 29 05/01/2019 0929   AST 24 02/23/2017  1237   ALT 18 05/01/2019 0929   ALT 15 02/23/2017 1237   BILITOT 0.6 05/01/2019 0929   BILITOT 0.6 09/24/2017 0824   BILITOT 0.98 02/23/2017 1237       All questions were answered. The patient knows to call the clinic with any problems, questions or concerns. No barriers to learning was detected.  I spent 15 minutes counseling the patient face to face. The total time spent in the appointment was 20 minutes and more than 50% was on counseling and review of test results  [name], MD 05/09/2019 9:24 AM

## 2019-05-09 NOTE — Assessment & Plan Note (Signed)
He has significant recurrent falls He has appointment to follow-up with rheumatologist The patient has walker to use at home but he has not been compliant.

## 2019-05-09 NOTE — Assessment & Plan Note (Signed)
His last myeloma panel was just borderline abnormal None of his current signs or symptoms are related to this I will see him back next year for further follow-up with blood to be drawn a week prior to visit

## 2019-05-15 ENCOUNTER — Encounter: Payer: Self-pay | Admitting: Cardiology

## 2019-05-15 ENCOUNTER — Emergency Department (HOSPITAL_COMMUNITY): Payer: Medicare Other

## 2019-05-15 ENCOUNTER — Ambulatory Visit (INDEPENDENT_AMBULATORY_CARE_PROVIDER_SITE_OTHER): Payer: Medicare Other | Admitting: Cardiology

## 2019-05-15 ENCOUNTER — Other Ambulatory Visit: Payer: Self-pay

## 2019-05-15 ENCOUNTER — Inpatient Hospital Stay (HOSPITAL_COMMUNITY)
Admission: EM | Admit: 2019-05-15 | Discharge: 2019-05-26 | DRG: 579 | Disposition: A | Discharge status: Rehabilitation Facility | Payer: Medicare Other | Attending: Family Medicine | Admitting: Family Medicine

## 2019-05-15 ENCOUNTER — Telehealth: Payer: Self-pay | Admitting: Cardiology

## 2019-05-15 ENCOUNTER — Encounter (HOSPITAL_COMMUNITY): Payer: Self-pay

## 2019-05-15 DIAGNOSIS — R296 Repeated falls: Secondary | ICD-10-CM | POA: Diagnosis not present

## 2019-05-15 DIAGNOSIS — R0602 Shortness of breath: Secondary | ICD-10-CM | POA: Diagnosis not present

## 2019-05-15 DIAGNOSIS — I5082 Biventricular heart failure: Secondary | ICD-10-CM | POA: Diagnosis present

## 2019-05-15 DIAGNOSIS — L03032 Cellulitis of left toe: Secondary | ICD-10-CM | POA: Diagnosis present

## 2019-05-15 DIAGNOSIS — N170 Acute kidney failure with tubular necrosis: Secondary | ICD-10-CM | POA: Diagnosis not present

## 2019-05-15 DIAGNOSIS — I50811 Acute right heart failure: Secondary | ICD-10-CM | POA: Diagnosis not present

## 2019-05-15 DIAGNOSIS — R0600 Dyspnea, unspecified: Secondary | ICD-10-CM | POA: Insufficient documentation

## 2019-05-15 DIAGNOSIS — E785 Hyperlipidemia, unspecified: Secondary | ICD-10-CM | POA: Diagnosis present

## 2019-05-15 DIAGNOSIS — I50812 Chronic right heart failure: Secondary | ICD-10-CM | POA: Diagnosis present

## 2019-05-15 DIAGNOSIS — R188 Other ascites: Secondary | ICD-10-CM

## 2019-05-15 DIAGNOSIS — G3183 Dementia with Lewy bodies: Secondary | ICD-10-CM | POA: Diagnosis not present

## 2019-05-15 DIAGNOSIS — C61 Malignant neoplasm of prostate: Secondary | ICD-10-CM

## 2019-05-15 DIAGNOSIS — I639 Cerebral infarction, unspecified: Secondary | ICD-10-CM | POA: Diagnosis present

## 2019-05-15 DIAGNOSIS — I5032 Chronic diastolic (congestive) heart failure: Secondary | ICD-10-CM | POA: Diagnosis present

## 2019-05-15 DIAGNOSIS — M7989 Other specified soft tissue disorders: Secondary | ICD-10-CM | POA: Diagnosis not present

## 2019-05-15 DIAGNOSIS — R1314 Dysphagia, pharyngoesophageal phase: Secondary | ICD-10-CM | POA: Diagnosis present

## 2019-05-15 DIAGNOSIS — I2510 Atherosclerotic heart disease of native coronary artery without angina pectoris: Secondary | ICD-10-CM

## 2019-05-15 DIAGNOSIS — I5043 Acute on chronic combined systolic (congestive) and diastolic (congestive) heart failure: Secondary | ICD-10-CM | POA: Diagnosis not present

## 2019-05-15 DIAGNOSIS — D61818 Other pancytopenia: Secondary | ICD-10-CM | POA: Diagnosis present

## 2019-05-15 DIAGNOSIS — L089 Local infection of the skin and subcutaneous tissue, unspecified: Secondary | ICD-10-CM | POA: Diagnosis present

## 2019-05-15 DIAGNOSIS — R51 Headache: Secondary | ICD-10-CM | POA: Diagnosis not present

## 2019-05-15 DIAGNOSIS — Z79899 Other long term (current) drug therapy: Secondary | ICD-10-CM

## 2019-05-15 DIAGNOSIS — I5031 Acute diastolic (congestive) heart failure: Secondary | ICD-10-CM | POA: Diagnosis not present

## 2019-05-15 DIAGNOSIS — R0609 Other forms of dyspnea: Secondary | ICD-10-CM

## 2019-05-15 DIAGNOSIS — M868X7 Other osteomyelitis, ankle and foot: Secondary | ICD-10-CM | POA: Diagnosis not present

## 2019-05-15 DIAGNOSIS — H5509 Other forms of nystagmus: Secondary | ICD-10-CM | POA: Diagnosis present

## 2019-05-15 DIAGNOSIS — I6330 Cerebral infarction due to thrombosis of unspecified cerebral artery: Secondary | ICD-10-CM | POA: Diagnosis not present

## 2019-05-15 DIAGNOSIS — M109 Gout, unspecified: Secondary | ICD-10-CM | POA: Diagnosis present

## 2019-05-15 DIAGNOSIS — E1142 Type 2 diabetes mellitus with diabetic polyneuropathy: Secondary | ICD-10-CM | POA: Diagnosis present

## 2019-05-15 DIAGNOSIS — E1151 Type 2 diabetes mellitus with diabetic peripheral angiopathy without gangrene: Secondary | ICD-10-CM | POA: Diagnosis present

## 2019-05-15 DIAGNOSIS — Z951 Presence of aortocoronary bypass graft: Secondary | ICD-10-CM | POA: Diagnosis not present

## 2019-05-15 DIAGNOSIS — I071 Rheumatic tricuspid insufficiency: Secondary | ICD-10-CM | POA: Diagnosis not present

## 2019-05-15 DIAGNOSIS — J8410 Pulmonary fibrosis, unspecified: Secondary | ICD-10-CM | POA: Diagnosis present

## 2019-05-15 DIAGNOSIS — D6959 Other secondary thrombocytopenia: Secondary | ICD-10-CM | POA: Diagnosis present

## 2019-05-15 DIAGNOSIS — E119 Type 2 diabetes mellitus without complications: Secondary | ICD-10-CM | POA: Diagnosis present

## 2019-05-15 DIAGNOSIS — I480 Paroxysmal atrial fibrillation: Secondary | ICD-10-CM | POA: Diagnosis present

## 2019-05-15 DIAGNOSIS — I25119 Atherosclerotic heart disease of native coronary artery with unspecified angina pectoris: Secondary | ICD-10-CM | POA: Diagnosis not present

## 2019-05-15 DIAGNOSIS — M79672 Pain in left foot: Secondary | ICD-10-CM | POA: Diagnosis not present

## 2019-05-15 DIAGNOSIS — Z8249 Family history of ischemic heart disease and other diseases of the circulatory system: Secondary | ICD-10-CM

## 2019-05-15 DIAGNOSIS — I5033 Acute on chronic diastolic (congestive) heart failure: Secondary | ICD-10-CM | POA: Diagnosis not present

## 2019-05-15 DIAGNOSIS — I69398 Other sequelae of cerebral infarction: Secondary | ICD-10-CM | POA: Diagnosis not present

## 2019-05-15 DIAGNOSIS — D638 Anemia in other chronic diseases classified elsewhere: Secondary | ICD-10-CM | POA: Diagnosis present

## 2019-05-15 DIAGNOSIS — G4733 Obstructive sleep apnea (adult) (pediatric): Secondary | ICD-10-CM | POA: Diagnosis not present

## 2019-05-15 DIAGNOSIS — Z6835 Body mass index (BMI) 35.0-35.9, adult: Secondary | ICD-10-CM

## 2019-05-15 DIAGNOSIS — Z950 Presence of cardiac pacemaker: Secondary | ICD-10-CM | POA: Diagnosis present

## 2019-05-15 DIAGNOSIS — I50813 Acute on chronic right heart failure: Secondary | ICD-10-CM | POA: Diagnosis not present

## 2019-05-15 DIAGNOSIS — J984 Other disorders of lung: Secondary | ICD-10-CM | POA: Diagnosis not present

## 2019-05-15 DIAGNOSIS — K7460 Unspecified cirrhosis of liver: Secondary | ICD-10-CM | POA: Diagnosis present

## 2019-05-15 DIAGNOSIS — N179 Acute kidney failure, unspecified: Secondary | ICD-10-CM | POA: Diagnosis present

## 2019-05-15 DIAGNOSIS — D696 Thrombocytopenia, unspecified: Secondary | ICD-10-CM | POA: Diagnosis present

## 2019-05-15 DIAGNOSIS — D472 Monoclonal gammopathy: Secondary | ICD-10-CM | POA: Diagnosis not present

## 2019-05-15 DIAGNOSIS — I959 Hypotension, unspecified: Secondary | ICD-10-CM | POA: Diagnosis not present

## 2019-05-15 DIAGNOSIS — H532 Diplopia: Secondary | ICD-10-CM | POA: Diagnosis not present

## 2019-05-15 DIAGNOSIS — I110 Hypertensive heart disease with heart failure: Secondary | ICD-10-CM | POA: Diagnosis present

## 2019-05-15 DIAGNOSIS — R52 Pain, unspecified: Secondary | ICD-10-CM | POA: Diagnosis not present

## 2019-05-15 DIAGNOSIS — Z20828 Contact with and (suspected) exposure to other viral communicable diseases: Secondary | ICD-10-CM | POA: Diagnosis present

## 2019-05-15 DIAGNOSIS — E1169 Type 2 diabetes mellitus with other specified complication: Secondary | ICD-10-CM | POA: Diagnosis not present

## 2019-05-15 DIAGNOSIS — I4811 Longstanding persistent atrial fibrillation: Secondary | ICD-10-CM | POA: Diagnosis not present

## 2019-05-15 DIAGNOSIS — N400 Enlarged prostate without lower urinary tract symptoms: Secondary | ICD-10-CM | POA: Diagnosis present

## 2019-05-15 DIAGNOSIS — Z6830 Body mass index (BMI) 30.0-30.9, adult: Secondary | ICD-10-CM

## 2019-05-15 DIAGNOSIS — Z87891 Personal history of nicotine dependence: Secondary | ICD-10-CM

## 2019-05-15 DIAGNOSIS — Z888 Allergy status to other drugs, medicaments and biological substances status: Secondary | ICD-10-CM

## 2019-05-15 DIAGNOSIS — Z85828 Personal history of other malignant neoplasm of skin: Secondary | ICD-10-CM

## 2019-05-15 DIAGNOSIS — Z886 Allergy status to analgesic agent status: Secondary | ICD-10-CM

## 2019-05-15 DIAGNOSIS — I4891 Unspecified atrial fibrillation: Secondary | ICD-10-CM | POA: Diagnosis present

## 2019-05-15 DIAGNOSIS — Z8546 Personal history of malignant neoplasm of prostate: Secondary | ICD-10-CM

## 2019-05-15 DIAGNOSIS — G629 Polyneuropathy, unspecified: Secondary | ICD-10-CM | POA: Diagnosis present

## 2019-05-15 DIAGNOSIS — E669 Obesity, unspecified: Secondary | ICD-10-CM | POA: Diagnosis present

## 2019-05-15 DIAGNOSIS — Z8739 Personal history of other diseases of the musculoskeletal system and connective tissue: Secondary | ICD-10-CM | POA: Diagnosis not present

## 2019-05-15 DIAGNOSIS — R29700 NIHSS score 0: Secondary | ICD-10-CM | POA: Diagnosis present

## 2019-05-15 DIAGNOSIS — G4730 Sleep apnea, unspecified: Secondary | ICD-10-CM | POA: Diagnosis present

## 2019-05-15 DIAGNOSIS — F02818 Dementia in other diseases classified elsewhere, unspecified severity, with other behavioral disturbance: Secondary | ICD-10-CM | POA: Diagnosis present

## 2019-05-15 DIAGNOSIS — I361 Nonrheumatic tricuspid (valve) insufficiency: Secondary | ICD-10-CM | POA: Diagnosis not present

## 2019-05-15 DIAGNOSIS — Z8547 Personal history of malignant neoplasm of testis: Secondary | ICD-10-CM

## 2019-05-15 DIAGNOSIS — R161 Splenomegaly, not elsewhere classified: Secondary | ICD-10-CM | POA: Diagnosis present

## 2019-05-15 DIAGNOSIS — F0281 Dementia in other diseases classified elsewhere with behavioral disturbance: Secondary | ICD-10-CM | POA: Diagnosis present

## 2019-05-15 DIAGNOSIS — R5381 Other malaise: Secondary | ICD-10-CM | POA: Diagnosis not present

## 2019-05-15 DIAGNOSIS — Z89412 Acquired absence of left great toe: Secondary | ICD-10-CM | POA: Diagnosis not present

## 2019-05-15 DIAGNOSIS — F0280 Dementia in other diseases classified elsewhere without behavioral disturbance: Secondary | ICD-10-CM | POA: Diagnosis present

## 2019-05-15 LAB — HEPATIC FUNCTION PANEL
ALT: 16 U/L (ref 0–44)
AST: 28 U/L (ref 15–41)
Albumin: 3.5 g/dL (ref 3.5–5.0)
Alkaline Phosphatase: 81 U/L (ref 38–126)
Bilirubin, Direct: 0.2 mg/dL (ref 0.0–0.2)
Indirect Bilirubin: 0.8 mg/dL (ref 0.3–0.9)
Total Bilirubin: 1 mg/dL (ref 0.3–1.2)
Total Protein: 7.7 g/dL (ref 6.5–8.1)

## 2019-05-15 LAB — TROPONIN I: Troponin I: <0.03 ng/mL (ref <0.03)

## 2019-05-15 LAB — BASIC METABOLIC PANEL
Anion gap: 10 (ref 5–15)
BUN: 20 mg/dL (ref 8–23)
CO2: 23 mmol/L (ref 22–32)
Calcium: 8.9 mg/dL (ref 8.9–10.3)
Chloride: 107 mmol/L (ref 98–111)
Creatinine, Ser: 1.37 mg/dL — ABNORMAL HIGH (ref 0.61–1.24)
GFR calc Af Amer: 58 mL/min — ABNORMAL LOW (ref >60)
GFR calc non Af Amer: 50 mL/min — ABNORMAL LOW (ref >60)
Glucose, Bld: 102 mg/dL — ABNORMAL HIGH (ref 70–99)
Potassium: 3.8 mmol/L (ref 3.5–5.1)
Sodium: 140 mmol/L (ref 135–145)

## 2019-05-15 LAB — CBC
HCT: 29.9 % — ABNORMAL LOW (ref 39.0–52.0)
Hemoglobin: 9.5 g/dL — ABNORMAL LOW (ref 13.0–17.0)
MCH: 28.9 pg (ref 26.0–34.0)
MCHC: 31.8 g/dL (ref 30.0–36.0)
MCV: 90.9 fL (ref 80.0–100.0)
Platelets: 93 10*3/uL — ABNORMAL LOW (ref 150–400)
RBC: 3.29 MIL/uL — ABNORMAL LOW (ref 4.22–5.81)
RDW: 16.2 % — ABNORMAL HIGH (ref 11.5–15.5)
WBC: 4.1 10*3/uL (ref 4.0–10.5)
nRBC: 0 % (ref 0.0–0.2)

## 2019-05-15 LAB — LACTIC ACID, PLASMA: Lactic Acid, Venous: 0.6 mmol/L (ref 0.5–1.9)

## 2019-05-15 LAB — SARS CORONAVIRUS 2 BY RT PCR (HOSPITAL ORDER, PERFORMED IN ~~LOC~~ HOSPITAL LAB): SARS Coronavirus 2: NEGATIVE

## 2019-05-15 MED ORDER — TAMSULOSIN HCL 0.4 MG PO CAPS
0.4000 mg | ORAL_CAPSULE | Freq: Every day | ORAL | Status: DC
  Administered 2019-05-15 – 2019-05-25 (×11): 0.4 mg via ORAL
  Filled 2019-05-15 (×11): qty 1

## 2019-05-15 MED ORDER — POTASSIUM CHLORIDE CRYS ER 20 MEQ PO TBCR
20.0000 meq | EXTENDED_RELEASE_TABLET | Freq: Every day | ORAL | Status: DC
  Administered 2019-05-16 – 2019-05-26 (×11): 20 meq via ORAL
  Filled 2019-05-15 (×12): qty 1

## 2019-05-15 MED ORDER — ALLOPURINOL 300 MG PO TABS
300.0000 mg | ORAL_TABLET | Freq: Every day | ORAL | Status: DC
  Administered 2019-05-16 – 2019-05-26 (×11): 300 mg via ORAL
  Filled 2019-05-15 (×11): qty 1

## 2019-05-15 MED ORDER — ONDANSETRON HCL 4 MG PO TABS: 4.0000 mg | ORAL_TABLET | Freq: Four times a day (QID) | ORAL | Status: DC | PRN

## 2019-05-15 MED ORDER — FERROUS SULFATE 325 (65 FE) MG PO TABS
325.0000 mg | ORAL_TABLET | Freq: Every day | ORAL | Status: DC
  Administered 2019-05-16 – 2019-05-26 (×11): 325 mg via ORAL
  Filled 2019-05-15 (×11): qty 1

## 2019-05-15 MED ORDER — VANCOMYCIN HCL IN DEXTROSE 750-5 MG/150ML-% IV SOLN
750.0000 mg | INTRAVENOUS | Status: AC
  Administered 2019-05-15: 750 mg via INTRAVENOUS
  Filled 2019-05-15: qty 150

## 2019-05-15 MED ORDER — PANTOPRAZOLE SODIUM 40 MG PO TBEC
40.0000 mg | DELAYED_RELEASE_TABLET | Freq: Every day | ORAL | Status: DC
  Administered 2019-05-16 – 2019-05-26 (×11): 40 mg via ORAL
  Filled 2019-05-15 (×11): qty 1

## 2019-05-15 MED ORDER — ONDANSETRON HCL 4 MG/2ML IJ SOLN: 4.0000 mg | Freq: Four times a day (QID) | INTRAMUSCULAR | Status: DC | PRN

## 2019-05-15 MED ORDER — SPIRONOLACTONE 25 MG PO TABS
25.0000 mg | ORAL_TABLET | Freq: Every day | ORAL | Status: DC
  Administered 2019-05-15 – 2019-05-21 (×7): 25 mg via ORAL
  Filled 2019-05-15 (×7): qty 1

## 2019-05-15 MED ORDER — SODIUM CHLORIDE 0.9% FLUSH: 3.0000 mL | Freq: Once | INTRAVENOUS | Status: DC

## 2019-05-15 MED ORDER — ROSUVASTATIN CALCIUM 20 MG PO TABS
40.0000 mg | ORAL_TABLET | Freq: Every day | ORAL | Status: DC
  Administered 2019-05-15 – 2019-05-26 (×12): 40 mg via ORAL
  Filled 2019-05-15 (×12): qty 2

## 2019-05-15 MED ORDER — MIRABEGRON ER 50 MG PO TB24
50.0000 mg | ORAL_TABLET | Freq: Every day | ORAL | Status: DC
  Administered 2019-05-16 – 2019-05-26 (×11): 50 mg via ORAL
  Filled 2019-05-15 (×11): qty 1

## 2019-05-15 MED ORDER — VANCOMYCIN HCL IN DEXTROSE 1-5 GM/200ML-% IV SOLN
1000.0000 mg | INTRAVENOUS | Status: DC
  Administered 2019-05-16 – 2019-05-18 (×3): 1000 mg via INTRAVENOUS
  Filled 2019-05-15 (×4): qty 200

## 2019-05-15 MED ORDER — ACETAMINOPHEN 650 MG RE SUPP: 650.0000 mg | Freq: Four times a day (QID) | RECTAL | Status: DC | PRN

## 2019-05-15 MED ORDER — VANCOMYCIN HCL IN DEXTROSE 1-5 GM/200ML-% IV SOLN
1000.0000 mg | Freq: Once | INTRAVENOUS | Status: AC
  Administered 2019-05-15: 1000 mg via INTRAVENOUS
  Filled 2019-05-15: qty 200

## 2019-05-15 MED ORDER — ACETAMINOPHEN 325 MG PO TABS
650.0000 mg | ORAL_TABLET | Freq: Four times a day (QID) | ORAL | Status: DC | PRN
  Administered 2019-05-17: 650 mg via ORAL
  Filled 2019-05-15: qty 2

## 2019-05-15 MED ORDER — METOPROLOL TARTRATE 50 MG PO TABS
75.0000 mg | ORAL_TABLET | Freq: Two times a day (BID) | ORAL | Status: DC
  Administered 2019-05-15 – 2019-05-18 (×7): 75 mg via ORAL
  Filled 2019-05-15 (×2): qty 3
  Filled 2019-05-15: qty 1
  Filled 2019-05-15: qty 3
  Filled 2019-05-15: qty 1
  Filled 2019-05-15: qty 3
  Filled 2019-05-15 (×2): qty 1

## 2019-05-15 MED ORDER — FUROSEMIDE 10 MG/ML IJ SOLN
80.0000 mg | Freq: Two times a day (BID) | INTRAMUSCULAR | Status: DC
  Administered 2019-05-15 – 2019-05-17 (×4): 80 mg via INTRAVENOUS
  Filled 2019-05-15 (×6): qty 8

## 2019-05-15 MED ORDER — SERTRALINE HCL 100 MG PO TABS
200.0000 mg | ORAL_TABLET | Freq: Every day | ORAL | Status: DC
  Administered 2019-05-16 – 2019-05-26 (×11): 200 mg via ORAL
  Filled 2019-05-15 (×11): qty 2

## 2019-05-15 NOTE — ED Notes (Signed)
Patient transported to X-ray 

## 2019-05-15 NOTE — Telephone Encounter (Signed)
Returned call to patient's wife she stated husband has increased swelling in both feet.Stated he is sob hard to walk from room to room the past 1 week.He does have a non productive cough.Stated she is not sure if he has gained weight.Wife requesting office appointment.Appoinntment scheduled with Kerin Ransom PA today at 1:30 pm.

## 2019-05-15 NOTE — ED Notes (Signed)
Spoke with wife with patient in waiting room. [facility] [phone] and Cell [phone]. Will leave and bring back his CPAP machine.

## 2019-05-15 NOTE — Assessment & Plan Note (Signed)
Pt has Rt heart failure on exam

## 2019-05-15 NOTE — ED Notes (Signed)
ED TO INPATIENT HANDOFF REPORT  ED Nurse Name and Phone #: [phone]  S Name/Age/Gender [name]. 78 y.o. male Room/Bed: 039C/039C  Code Status   Code Status: Full Code  Home/SNF/Other Home Patient oriented to: self, place, time and situation Is this baseline? Yes   Triage Complete: Triage complete  Chief Complaint Fluid   Triage Note Pt sent here for evaluation of new onset CHF. Pt states he has been increasingly SOB over the past few weeks and he has had bilateral leg swelling. Nad noted, pt a.o   Allergies Allergies  Allergen Reactions  . Lipitor [Atorvastatin]   . Nsaids   . Warfarin And Related   . Allopurinol Itching    May or may not be allergic to this (If the patient should find out he is NOT allergic, please omit this entry.)  . Enbrel [Etanercept] Itching    May or may not be allergic to this (If the patient should find out he is NOT allergic, please omit this entry.)    Level of Care/Admitting Diagnosis ED Disposition    ED Disposition Condition [place]: [place] [[id number]]  Level of Care: Telemetry Cardiac [103]  Covid Evaluation: Screening Protocol (No Symptoms)  Diagnosis: Acute right heart failure [facility]) [[id number]]  Admitting Physician: Rise Patience [phone]  Attending Physician: Rise Patience [phone]  Estimated length of stay: past midnight tomorrow  Certification:: I certify this patient will need inpatient services for at least 2 midnights  PT Class (Do Not Modify): Inpatient [101]  PT Acc Code (Do Not Modify): Private [1]       B Medical/Surgery History Past Medical History:  Diagnosis Date  . (HFpEF) heart failure with preserved ejection fraction ([place])    a. 05/2013 Echo: EF 55%, mild LVH, diast dysfxn, Ao sclerosis, mildly dil LA, RV dysfxn (poorly visualized), PASP 74mmHg;  b. 03/2017 Echo: EF 55-60%, no rwma, triv MR, mildly dil RV, mod TR, PASP 76mmHg.  . Atrial fibrillation  [facility])    s/p Cox Maze 1/09;  Multaq Rx d/c'd in 2014 due to pulmo fibrosis;  coumadin d/c'd in 2014 due to spontaneous subdural hematoma  . BPH (benign prostatic hyperplasia)   . CAD (coronary artery disease), native coronary artery    a. s/p CABG 12/2007;  b. Myoview 12/2011: EF 66%, no scar or ischemia; normal.  . DM (diabetes mellitus) ([place])   . Hyperlipidemia type II   . Hypertension   . 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 OSA (obstructive sleep apnea)   . Pacemaker    PPM - St. Jude  . Peripheral neuropathy 07/31/2018  . Pulmonary fibrosis ([place])    Multaq d/c'd 7/14  . Subdural hematoma ([place]) 07/2012   spontaneous;  coumadin d/c'd => no longer a candidate for anticoagulation   Past Surgical History:  Procedure Laterality Date  . APPENDECTOMY    . CHOLECYSTECTOMY    . CORONARY ARTERY BYPASS GRAFT     x3 (left internal mammary artery to distal left anterior descending coronary artery, saphenous vain graft to second circumflex marginal branch, saphenous vain graft to posterior descending coronary artery, endoscopic saphenous vain harvest from right thigh) and modified Cox - Maze IV procedure.  [name],MD. Electronically signed CHO/MEDQ D: 01/09/2008/ JOB: [id number] cc:  [name]  . [name]  07/30/2012   Procedure: CRANIOTOMY HEMATOMA EVACUATION SUBDURAL;  Surgeon: [name], MD;  Location:  [place] [facility];  Service: Neurosurgery;  Laterality: Right;  Right craniotomy for evacuation of subdural hematoma  . FOOT SURGERY    . HERNIA REPAIR    . ORCHIECTOMY     Left  /  testicular cancer  . PACEMAKER PLACEMENT     PPM - St. Jude     A IV Location/Drains/Wounds Patient Lines/Drains/Airways Status   Active Line/Drains/Airways    Name:   Placement date:   Placement time:   Site:   Days:   Peripheral IV 05/15/19 Left Wrist   05/15/19    1906    Wrist   less than 1   Incision 07/30/12 Head Right   07/30/12    1136     2480           Intake/Output Last 24 hours No intake or output data in the 24 hours ending 05/15/19 1957  Labs/Imaging Results for orders placed or performed during the hospital encounter of 05/15/19 (from the past 48 hour(s))  Basic metabolic panel     Status: Abnormal   Collection Time: 05/15/19  3:35 PM  Result Value Ref Range   Sodium 140 135 - 145 mmol/L   Potassium 3.8 3.5 - 5.1 mmol/L   Chloride 107 98 - 111 mmol/L   CO2 23 22 - 32 mmol/L   Glucose, Bld 102 (H) 70 - 99 mg/dL   BUN 20 8 - 23 mg/dL   Creatinine, Ser 1.37 (H) 0.61 - 1.24 mg/dL   Calcium 8.9 8.9 - 10.3 mg/dL   GFR calc non Af Amer 50 (L) >60 mL/min   GFR calc Af Amer 58 (L) >60 mL/min   Anion gap 10 5 - 15    Comment: Performed at [facility], [address], [place], [place] [address]  CBC     Status: Abnormal   Collection Time: 05/15/19  3:35 PM  Result Value Ref Range   WBC 4.1 4.0 - 10.5 K/uL   RBC 3.29 (L) 4.22 - 5.81 MIL/uL   Hemoglobin 9.5 (L) 13.0 - 17.0 g/dL   HCT 29.9 (L) 39.0 - 52.0 %   MCV 90.9 80.0 - 100.0 fL   MCH 28.9 26.0 - 34.0 pg   MCHC 31.8 30.0 - 36.0 g/dL   RDW 16.2 (H) 11.5 - 15.5 %   Platelets 93 (L) 150 - 400 K/uL    Comment: REPEATED TO VERIFY PLATELET COUNT CONFIRMED BY SMEAR SPECIMEN CHECKED FOR CLOTS Immature Platelet Fraction may be clinically indicated, consider ordering this additional test [id number]    nRBC 0.0 0.0 - 0.2 %    Comment: Performed at [facility], [place] [address] [place], [place] [address]  Troponin I - ONCE - STAT     Status: None   Collection Time: 05/15/19  3:35 PM  Result Value Ref Range   Troponin I <0.03 <0.03 ng/mL    Comment: Performed at [facility], [place] [address], [place], [place] [address]  Hepatic function panel     Status: None   Collection Time: 05/15/19  5:46 PM  Result Value Ref Range   Total Protein 7.7 6.5 - 8.1 g/dL   Albumin 3.5 3.5 - 5.0 g/dL   AST 28 15 - 41 U/L   ALT 16 0 - 44 U/L   Alkaline Phosphatase 81 38 - 126  U/L   Total Bilirubin 1.0 0.3 - 1.2 mg/dL   Bilirubin, Direct 0.2 0.0 - 0.2 mg/dL   Indirect Bilirubin  0.8 0.3 - 0.9 mg/dL    Comment: Performed at [facility], [place] [address] [place], [place] [address]  Lactic acid, plasma     Status: None   Collection Time: 05/15/19  6:03 PM  Result Value Ref Range   Lactic Acid, Venous 0.6 0.5 - 1.9 mmol/L    Comment: Performed at [place] [address], [place], [place] [address]  SARS Coronavirus 2 (CEPHEID - Performed in [place] hospital lab), Hosp Order     Status: None   Collection Time: 05/15/19  6:22 PM  Result Value Ref Range   SARS Coronavirus 2 NEGATIVE NEGATIVE    Comment: (NOTE) If result is NEGATIVE SARS-CoV-2 target nucleic acids are NOT DETECTED. The SARS-CoV-2 RNA is generally detectable in upper and lower  respiratory specimens during the acute phase of infection. The lowest  concentration of SARS-CoV-2 viral copies this assay can detect is 250  copies / mL. A negative result does not preclude SARS-CoV-2 infection  and should not be used as the sole basis for treatment or other  patient management decisions.  A negative result may occur with  improper specimen collection / handling, submission of specimen other  than nasopharyngeal swab, presence of viral mutation(s) within the  areas targeted by this assay, and inadequate number of viral copies  (<250 copies / mL). A negative result must be combined with clinical  observations, patient history, and epidemiological information. If result is POSITIVE SARS-CoV-2 target nucleic acids are DETECTED. The SARS-CoV-2 RNA is generally detectable in upper and lower  respiratory specimens dur ing the acute phase of infection.  Positive  results are indicative of active infection with SARS-CoV-2.  Clinical  correlation with patient history and other diagnostic information is  necessary to determine patient infection status.  Positive results do  not rule out  bacterial infection or co-infection with other viruses. If result is PRESUMPTIVE POSTIVE SARS-CoV-2 nucleic acids MAY BE PRESENT.   A presumptive positive result was obtained on the submitted specimen  and confirmed on repeat testing.  While 2019 novel coronavirus  (SARS-CoV-2) nucleic acids may be present in the submitted sample  additional confirmatory testing may be necessary for epidemiological  and / or clinical management purposes  to differentiate between  SARS-CoV-2 and other Sarbecovirus currently known to infect humans.  If clinically indicated additional testing with an alternate test  methodology [phone]) is advised. The SARS-CoV-2 RNA is generally  detectable in upper and lower respiratory sp ecimens during the acute  phase of infection. The expected result is Negative. Fact Sheet for Patients:  StrictlyIdeas.no Fact Sheet for Healthcare Providers: BankingDealers.co.za This test is not yet approved or cleared by the [place] FDA and has been authorized for detection and/or diagnosis of SARS-CoV-2 by FDA under an Emergency Use Authorization (EUA).  This EUA will remain in effect (meaning this test can be used) for the duration of the COVID-19 declaration under Section 564(b)(1) of the Act, 21 U.S.C. section 360bbb-3(b)(1), unless the authorization is terminated or revoked sooner. Performed at [facility], [place] [address], [place], [place] [address]    Dg Chest 2 View  Result Date: 05/15/2019 CLINICAL DATA:  One-week history of shortness of breath that worsens with exertion. Former smoker. Prior CABG. Indwelling pacemaker. EXAM: CHEST - 2 VIEW COMPARISON:  03/19/2017 and earlier, including CT chest 05/03/2017 and earlier. FINDINGS: AP ERECT and LATERAL images were obtained, and the LATERAL image is blurred by respiratory motion. Prior  sternotomy for CABG. Cardiac silhouette moderately enlarged, unchanged. LEFT  subclavian dual lead transvenous pacemaker unchanged and appears intact. Thoracic aorta atherosclerotic, unchanged. Hilar and mediastinal contours otherwise unremarkable. 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 Pulmonary vascularity normal without evidence of pulmonary edema currently. No confluent airspace consolidation. No pleural effusions. Mild degenerative changes involving the thoracic spine. IMPRESSION: 1. Stable cardiomegaly. No acute cardiopulmonary disease. 2. 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 Electronically Signed   By: Evangeline Dakin M.D.   On: 05/15/2019 16:53   Dg Toe 4th Left  Result Date: 05/15/2019 CLINICAL DATA:  Pain of the fourth toe EXAM: LEFT FOURTH TOE COMPARISON:  07/30/2018 FINDINGS: There is an old osteotomy of the fifth metatarsal. No bone or joint pathology is seen affecting the fourth toe. Distal soft tissues appear somewhat irregular, possibly with some soft tissue gas. IMPRESSION: No bone or joint abnormality of the fourth toe. Question soft tissue abnormality distally, possibly with some soft tissue gas. Electronically Signed   By: Nelson Chimes M.D.   On: 05/15/2019 18:23    Pending Labs Unresulted Labs (From admission, onward)    Start     Ordered   05/16/19 6237  Basic metabolic panel  Tomorrow morning,   R     05/15/19 1928   05/16/19 0500  Hepatic function panel  Tomorrow morning,   R     05/15/19 1928   05/16/19 0500  Magnesium  Tomorrow morning,   R     05/15/19 1928   05/16/19 0500  CBC WITH DIFFERENTIAL  Tomorrow morning,   R     05/15/19 1928   05/16/19 0500  TSH  Tomorrow morning,   R     05/15/19 1928   05/15/19 1803  Urinalysis, Routine w reflex microscopic  ONCE - STAT,   STAT     05/15/19 1802   05/15/19 1803  Culture, blood (routine x 2)  BLOOD CULTURE X 2,   STAT     05/15/19 1802           Vitals/Pain Today's Vitals   05/15/19 1635 05/15/19 1700 05/15/19 1715 05/15/19 1730  BP:  137/78 116/68 (!) 130/103  Pulse:   95 95  Resp:  20 (!) 23 17  Temp:      TempSrc:      SpO2:   99% 98%  Weight:      Height:      PainSc: 0-No pain       Isolation Precautions No active isolations  Medications Medications  sodium chloride flush (NS) 0.9 % injection 3 mL (has no administration in time range)  furosemide (LASIX) injection 80 mg (80 mg Intravenous Given 05/15/19 1903)  spironolactone (ALDACTONE) tablet 25 mg (has no administration in time range)  vancomycin (VANCOCIN) IVPB 1000 mg/200 mL premix (1,000 mg Intravenous New Bag/Given 05/15/19 1911)  allopurinol (ZYLOPRIM) tablet 300 mg (has no administration in time range)  metoprolol tartrate (LOPRESSOR) tablet 75 mg (has no administration in time range)  rosuvastatin (CRESTOR) tablet 40 mg (has no administration in time range)  sertraline (ZOLOFT) tablet 200 mg (has no administration in time range)  pantoprazole (PROTONIX) EC tablet 40 mg (has no administration in time range)  mirabegron ER (MYRBETRIQ) tablet 50 mg (has no administration in time range)  tamsulosin (FLOMAX) capsule 0.4 mg (has no administration in time range)  ferrous sulfate tablet 325 mg (has no administration  in time range)  potassium chloride SA (K-DUR) CR tablet 20 mEq (has no administration in time range)  acetaminophen (TYLENOL) tablet 650 mg (has no administration in time range)    Or  acetaminophen (TYLENOL) suppository 650 mg (has no administration in time range)  ondansetron (ZOFRAN) tablet 4 mg (has no administration in time range)    Or  ondansetron (ZOFRAN) injection 4 mg (has no administration in time range)    Mobility walks with device High fall risk   Focused Assessments Cardiac Assessment Handoff:    Lab Results  Component Value Date   CKTOTAL 43 06/18/2018   TROPONINI <0.03 05/15/2019   No results found for: DDIMER Does  the Patient currently have chest pain? No     R Recommendations: See Admitting Provider Note  Report given to:   Additional Notes:

## 2019-05-15 NOTE — Assessment & Plan Note (Signed)
Pt unable to tolerate Multaq- unable to taken anticoagulation secondary to spontaneous SDH

## 2019-05-15 NOTE — Progress Notes (Signed)
Pharmacy Antibiotic Note  [name]. is a 78 y.o. male admitted on 05/15/2019 with cellulitis.  Pharmacy has been consulted for vancomycin dosing. SCr 1.37 on admit. Patient already receiving vancomycin 1g IV x 1 currently in the ER.  Plan: Vancomycin 750mg  IV x 1 complete 1750mg  IV load; then Vancomycin 1000 mg IV Q 24 hrs. Goal AUC 400-550. Expected AUC: 522 SCr used: 1.37 Monitor clinical progress, c/s, renal function F/u de-escalation plan/LOT, vancomycin levels as indicated   Height: 5' 8.5" (174 cm) Weight: 201 lb 15.1 oz (91.6 kg) IBW/kg (Calculated) : 69.55  Temp (24hrs), Avg:98 F (36.7 C), Min:97.4 F (36.3 C), Max:98.5 F (36.9 C)  Recent Labs  Lab 05/15/19 1535 05/15/19 1803  WBC 4.1  --   CREATININE 1.37*  --   LATICACIDVEN  --  0.6    Estimated Creatinine Clearance: 50.9 mL/min (A) (by C-G formula based on SCr of 1.37 mg/dL (H)).    Allergies  Allergen Reactions  . Lipitor [Atorvastatin]   . Nsaids   . Warfarin And Related   . Allopurinol Itching    May or may not be allergic to this (If the patient should find out he is NOT allergic, please omit this entry.)  . Enbrel [Etanercept] Itching    May or may not be allergic to this (If the patient should find out he is NOT allergic, please omit this entry.)    Elicia Lamp, PharmD, BCPS Please check AMION for all [place] contact numbers Clinical Pharmacist 05/15/2019 8:03 PM

## 2019-05-15 NOTE — Assessment & Plan Note (Signed)
This did not seem to be an significant issue- see Dr Elpidio Eric notes

## 2019-05-15 NOTE — Assessment & Plan Note (Signed)
Pt reports compliance with C-pap of late

## 2019-05-15 NOTE — Assessment & Plan Note (Signed)
CABG x 3 and MAZE 2009- Myoview low risk May 2018

## 2019-05-15 NOTE — Telephone Encounter (Signed)
New message   Pt c/o swelling: STAT is pt has developed SOB within 24 hours  1) How much weight have you gained and in what time span? Wife is unsure.  2) If swelling, where is the swelling located? Feet and legs  3) Are you currently taking a fluid pill? Yes  4) Are you currently SOB? Pt wife says pt gets SOB when he gets up and moves around.  5) Do you have a log of your daily weights (if so, list)? No  6) Have you gained 3 pounds in a day or 5 pounds in a week? Wife is unsure.  7) Have you traveled recently? No

## 2019-05-15 NOTE — ED Triage Notes (Signed)
Pt sent here for evaluation of new onset CHF. Pt states he has been increasingly SOB over the past few weeks and he has had bilateral leg swelling. Nad noted, pt a.o

## 2019-05-15 NOTE — Progress Notes (Addendum)
05/15/2019 Jamie Terance Ice Jr.   03-25-1941  [id number]  Primary Physician [name], MD Primary Cardiologist: Dr [name] EP: Dr Lovena Le  HPI: The patient is a pleasant 78 year old male followed by Dr. [name] who was placed on my schedule today for evaluation of multiple complaints.  He had bypass grafting x3 in 2009 as well as a Maze procedure.  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  He subsequently had issues with Multaq and this was discontinued, Dr. Jacalyn Lefevre notes indicate he is in chronic atrial fibrillation.  He had a spontaneous subdural hematoma in 2013 and is not felt to be a candidate for anticoagulation.  He has neuropathy and frequent falls as well. His last functional study was an echocardiogram January 2020 which showed his ejection fraction to be 60 to 65% with RV enlargement, severe TR, and a PA pressure of 39 mmHg.  The patient has been to several physicians over the last couple months.  He has had gradual progressive decline.  He has sleep apnea and for a time had not been on his CPAP.  He was falling asleep frequently during the day.  He also complains of  increasing dyspnea on exertion and increasing lower extremity edema.  Dr. [name] saw him in April and increased his diuretic and his beta-blocker.  The patient and his wife say today that had no effect.  They said today when he got up to go use the bathroom this morning he was significantly short of breath by the time he got there.  He also had a fall last week and has ecchymosis on his left rib cage.  He says he does not know why he falls, his legs just "give out".  He has seen Dr. Brett Fairy as well as Dr [name] ([place]).  The patient and his wife indicated that they are frustrated at the patient's persistent decline and that we are unable to make an impact.  The patient does say he is short of breath with exertion but denies orthopnea.  He is not had chest pain.  He is unaware of his heart racing, his  heart rate in the office is 102.  His main complaint is dyspnea on exertion and lower extremity edema.  He has chronic venous changes in his lower extremities and pitting edema.  His wife tells me that earlier this week he had serous drainage from his feet. The patient tells me he has had "dry heaves" the last 48 hours.    Current Outpatient Medications  Medication Sig Dispense Refill  . allopurinol (ZYLOPRIM) 300 MG tablet Take 1 tablet by mouth daily.    . colchicine 0.6 MG tablet Take 0.6-1.2 mg by mouth See admin instructions. Take 1.2 mg by mouth then 0.6 mg one hour later as needed for gout flares/ as directed.  1  . ferrous sulfate 325 (65 FE) MG tablet Take 325 mg by mouth daily with breakfast.    . furosemide (LASIX) 40 MG tablet Take 2 tablets (80 mg total) by mouth 2 (two) times daily. 360 tablet 3  . metoprolol tartrate (LOPRESSOR) 50 MG tablet Take 1.5 tablets (75 mg total) by mouth 2 (two) times daily. 270 tablet 3  . MYRBETRIQ 50 MG TB24 tablet TAKE 1 TABLET BY MOUTH EVERY DAY 30 tablet 3  . pantoprazole (PROTONIX) 40 MG tablet Take 40 mg by mouth daily.    . potassium chloride SA (K-DUR,KLOR-CON) 20 MEQ tablet Take 20 mEq by mouth daily.      [name]  rosuvastatin (CRESTOR) 40 MG tablet Take 1 tablet (40 mg total) by mouth daily. 90 tablet 2  . sertraline (ZOLOFT) 100 MG tablet Take 200 mg by mouth daily.     . Tamsulosin HCl (FLOMAX) 0.4 MG CAPS Take 0.4 mg by mouth at bedtime.      No current facility-administered medications for this visit.     Allergies  Allergen Reactions  . Lipitor [Atorvastatin]   . Nsaids   . Warfarin And Related   . Allopurinol Itching    May or may not be allergic to this (If the patient should find out he is NOT allergic, please omit this entry.)  . Enbrel [Etanercept] Itching    May or may not be allergic to this (If the patient should find out he is NOT allergic, please omit this entry.)    Past Medical History:  Diagnosis Date  . (HFpEF) heart  failure with preserved ejection fraction ([place])    a. 05/2013 Echo: EF 55%, mild LVH, diast dysfxn, Ao sclerosis, mildly dil LA, RV dysfxn (poorly visualized), PASP 14mmHg;  b. 03/2017 Echo: EF 55-60%, no rwma, triv MR, mildly dil RV, mod TR, PASP 32mmHg.  . Atrial fibrillation [facility])    s/p Cox Maze 1/09;  Multaq Rx d/c'd in 2014 due to pulmo fibrosis;  coumadin d/c'd in 2014 due to spontaneous subdural hematoma  . BPH (benign prostatic hyperplasia)   . CAD (coronary artery disease), native coronary artery    a. s/p CABG 12/2007;  b. Myoview 12/2011: EF 66%, no scar or ischemia; normal.  . DM (diabetes mellitus) ([place])   . Hyperlipidemia type II   . Hypertension   . 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 OSA (obstructive sleep apnea)   . Pacemaker    PPM - St. Jude  . Peripheral neuropathy 07/31/2018  . Pulmonary fibrosis ([place])    Multaq d/c'd 7/14  . Subdural hematoma ([place]) 07/2012   spontaneous;  coumadin d/c'd => no longer a candidate for anticoagulation    Social History   Socioeconomic History  . Marital status: Married    Spouse name: [name]  . Number of children: 2  . Years of education: Not on file  . Highest education level: Not on file  Occupational History  . Occupation: Retired- IT trainer  Social Needs  . Financial resource strain: Not on file  . Food insecurity:    Worry: Not on file    Inability: Not on file  . Transportation needs:    Medical: Not on file    Non-medical: Not on file  Tobacco Use  . Smoking status: Former Smoker    Last attempt to quit: 02/21/1991    Years since quitting: 28.2  . Smokeless tobacco: Never Used  Substance and Sexual Activity  . Alcohol use: No    Alcohol/week: 0.0 standard drinks  . Drug use: No  . Sexual activity: Not Currently  Lifestyle  . Physical activity:    Days per week: Not on file    Minutes per session: Not on file  . Stress: Not on file  Relationships  . Social connections:     Talks on phone: Not on file    Gets together: Not on file    Attends religious service: Not on file    Active member of club or organization: Not on file    Attends meetings of clubs or organizations: Not on file    Relationship status: Not on file  .  Intimate partner violence:    Fear of current or ex partner: Not on file    Emotionally abused: Not on file    Physically abused: Not on file    Forced sexual activity: Not on file  Other Topics Concern  . Not on file  Social History Narrative   Lives with wife   Right handed    Married with two children.     He is a Engineer, structural.       Family History  Problem Relation Age of Onset  . Heart disease Father   . Heart attack Father   . Heart failure Father   . Heart disease Mother   . Alzheimer's disease Mother   . Dementia Mother      Review of Systems: General: negative for chills, fever, night sweats or weight changes.  Cardiovascular: negative for chest pain, orthopnea, palpitations Dermatological: negative for rash Respiratory: negative for cough or wheezing Urologic: negative for hematuria Abdominal: remarkable for nasuea and dry heaves.negative diarrhea, bright red blood per rectum, melena, or hematemesis Neurologic: negative for visual changes, syncope, or dizziness All other systems reviewed and are otherwise negative except as noted above.    Blood pressure 102/64, pulse (!) 102, temperature (!) 97.4 F (36.3 C), height 5' 8.5" (1.74 m), weight 202 lb (91.6 kg).  General appearance: alert, cooperative, no distress and mildly obese Neck: no JVD Lungs: clear Heart: regular rate and rhythm and 2/6 systolic murmur LSB, ? soft S3 Abdomen: liver down 4 fingers from Rt CM Extremities: chronic skin changes with rudy edema Pulses: diminnished Skin: pale cool dry Neurologic: grossly intact, awake alert, appropriate  EKG Tachycardia- 102, no clear P-wave or pacer spike. RBBB (old)  ASSESSMENT AND PLAN:   DOE  (dyspnea on exertion) Unclear etiology, possibly advanced Rt heart failure  Hx of CABG CABG x 3 and MAZE 2009- Myoview low risk May 2018  OSA (obstructive sleep apnea) Pt reports compliance with C-pap of late  Recurrent falls H/O neuropathy.   He has seen Dr Beacher May and Dr Jannifer Franklin as an OP  Pacemaker-St.Jude PPM - St. Jude- he has not seen dr Lovena Le in some time, consider pacer check while in the hospital.   [name] (monoclonal gammopathy of unknown significance) This did not seem to be an significant issue- see Dr Elpidio Eric notes  Atrial fibrillation ([place]) Pt unable to tolerate Multaq- unable to taken anticoagulation secondary to spontaneous SDH  Severe tricuspid regurgitation Pt has Rt heart failure on exam   PLAN  Discussed with Dr Ellyn Hack in the office today. The reasons for the patient decline is not readily apparent. The patient has multiple issues and has not had any improvement as an OP.  We feel it best to admit for in patient evaluation and treatment.  He suggested the pt present to the ED at [facility] for evaluation and admission to the Hospitalist service if appropriate. Cardiology will be notified to consult.   Kerin Ransom PA-C 05/15/2019 2:45 PM

## 2019-05-15 NOTE — H&P (Signed)
History and Physical    [name]. IFO:[id number] DOB: 13-May-1941 DOA: 05/15/2019  PCP: [name], MD   Patient coming from: Home.  Chief Complaint: Shortness of breath.  HPI: [name]. is a 78 y.o. male with history of CAD status post CABG status post maze procedure, pacemaker placement for bradycardia A. fib, chronic anemia and thrombocytopenia and possible cirrhosis presented to the cardiology office for follow-up while increasing shortness of breath which has been ongoing for last few months.  Patient states his shortness of breath further worsened acutely last few days.  Has been a increasing lower extremity edema extending up to the thighs.  Has noticed increasing erythema and discoloration of the left fourth toe last few days.  Has been having frequent falls.  Denies hitting his head or losing consciousness.  Denies chest pain productive cough fever chills abdominal pain nausea or vomiting.  Patient has had a previous history of spontaneous subdural hematoma and is not on any anticoagulation.  ED Course: Cardiology referred patient to the ER.  Chest x-ray shows chronic changes.  EKG shows possible A. fib with RBBB and inferolateral ischemic changes.  Labs show creatinine of 1.3 hemoglobin 9.5 platelets 93 LFTs show albumin of 3.5 lactic acid 0.6.  X-ray of the left foot does not show any acute changes in the bone structure of the left toe which looks necrotic.  Patient was started on Lasix 80 mg IV along with spironolactone and antibiotics for left foot cellulitis.  Blood cultures were sent.  Review of Systems: As per HPI, rest all negative.   Past Medical History:  Diagnosis Date  . (HFpEF) heart failure with preserved ejection fraction ([place])    a. 05/2013 Echo: EF 55%, mild LVH, diast dysfxn, Ao sclerosis, mildly dil LA, RV dysfxn (poorly visualized), PASP 110mmHg;  b. 03/2017 Echo: EF 55-60%, no rwma, triv MR, mildly dil RV, mod TR, PASP 35mmHg.  . Atrial  fibrillation [facility])    s/p Cox Maze 1/09;  Multaq Rx d/c'd in 2014 due to pulmo fibrosis;  coumadin d/c'd in 2014 due to spontaneous subdural hematoma  . BPH (benign prostatic hyperplasia)   . CAD (coronary artery disease), native coronary artery    a. s/p CABG 12/2007;  b. Myoview 12/2011: EF 66%, no scar or ischemia; normal.  . DM (diabetes mellitus) ([place])   . Hyperlipidemia type II   . Hypertension   . 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 OSA (obstructive sleep apnea)   . Pacemaker    PPM - St. Jude  . Peripheral neuropathy 07/31/2018  . Pulmonary fibrosis ([place])    Multaq d/c'd 7/14  . Subdural hematoma ([place]) 07/2012   spontaneous;  coumadin d/c'd => no longer a candidate for anticoagulation    Past Surgical History:  Procedure Laterality Date  . APPENDECTOMY    . CHOLECYSTECTOMY    . CORONARY ARTERY BYPASS GRAFT     x3 (left internal mammary artery to distal left anterior descending coronary artery, saphenous vain graft to second circumflex marginal branch, saphenous vain graft to posterior descending coronary artery, endoscopic saphenous vain harvest from right thigh) and modified Cox - Maze IV procedure.  [name],MD. Electronically signed CHO/MEDQ D: 01/09/2008/ JOB: [id number] cc:  [name]  . [name]  07/30/2012   Procedure: CRANIOTOMY HEMATOMA EVACUATION SUBDURAL;  Surgeon: [name], MD;  Location: [place];  Service: Neurosurgery;  Laterality: Right;  Right  craniotomy for evacuation of subdural hematoma  . FOOT SURGERY    . HERNIA REPAIR    . ORCHIECTOMY     Left  /  testicular cancer  . PACEMAKER PLACEMENT     PPM - St. Jude     reports that he quit smoking about 28 years ago. He has never used smokeless tobacco. He reports that he does not drink alcohol or use drugs.  Allergies  Allergen Reactions  . Lipitor [Atorvastatin]   . Nsaids   . Warfarin And Related   . Allopurinol Itching    May or may not be  allergic to this (If the patient should find out he is NOT allergic, please omit this entry.)  . Enbrel [Etanercept] Itching    May or may not be allergic to this (If the patient should find out he is NOT allergic, please omit this entry.)    Family History  Problem Relation Age of Onset  . Heart disease Father   . Heart attack Father   . Heart failure Father   . Heart disease Mother   . Alzheimer's disease Mother   . Dementia Mother     Prior to Admission medications   Medication Sig Start Date End Date Taking? Authorizing Provider  allopurinol (ZYLOPRIM) 300 MG tablet Take 1 tablet by mouth daily. 06/21/16   [provider]  colchicine 0.6 MG tablet Take 0.6-1.2 mg by mouth See admin instructions. Take 1.2 mg by mouth then 0.6 mg one hour later as needed for gout flares/ as directed. 05/13/15   [provider]  ferrous sulfate 325 (65 FE) MG tablet Take 325 mg by mouth daily with breakfast.    [provider]  furosemide (LASIX) 40 MG tablet Take 2 tablets (80 mg total) by mouth 2 (two) times daily. 03/31/19   [name], MD  metoprolol tartrate (LOPRESSOR) 50 MG tablet Take 1.5 tablets (75 mg total) by mouth 2 (two) times daily. 03/31/19   [name], MD  MYRBETRIQ 50 MG TB24 tablet TAKE 1 TABLET BY MOUTH EVERY DAY 04/21/19   Kathrynn Ducking, MD  pantoprazole (PROTONIX) 40 MG tablet Take 40 mg by mouth daily. 02/22/17   [provider]  potassium chloride SA (K-DUR,KLOR-CON) 20 MEQ tablet Take 20 mEq by mouth daily.      [provider]  rosuvastatin (CRESTOR) 40 MG tablet Take 1 tablet (40 mg total) by mouth daily. 12/25/18   [name], MD  sertraline (ZOLOFT) 100 MG tablet Take 200 mg by mouth daily.     [provider]  Tamsulosin HCl (FLOMAX) 0.4 MG CAPS Take 0.4 mg by mouth at bedtime.     [provider]    Physical Exam: Vitals:   05/15/19 1534 05/15/19 1700 05/15/19 1715 05/15/19 1730  BP:  137/78  116/68 (!) 130/103  Pulse:   95 95  Resp:  20 (!) 23 17  Temp:      TempSrc:      SpO2:   99% 98%  Weight: 91.6 kg     Height: 5' 8.5" (1.74 m)         Constitutional: Moderately built and nourished. Vitals:   05/15/19 1534 05/15/19 1700 05/15/19 1715 05/15/19 1730  BP:  137/78 116/68 (!) 130/103  Pulse:   95 95  Resp:  20 (!) 23 17  Temp:      TempSrc:      SpO2:   99% 98%  Weight: 91.6  kg     Height: 5' 8.5" (1.74 m)      Eyes: Anicteric no pallor. ENMT: No discharge from the ears eyes nose or mouth. Neck: No mass or.  JVD elevated. Respiratory: No rhonchi mild crepitations. Cardiovascular: S1-S2 heard. Abdomen: Soft nontender bowel sounds present mildly distended. Musculoskeletal: Bilateral lower extremity edema present. Skin: Erythema of the lower extremity more on the left side with discoloration of the left fourth toe. Neurologic: Alert awake oriented to time place and person.  Moves all extremities. Psychiatric: Appears normal per normal affect.   Labs on Admission: I have personally reviewed following labs and imaging studies  CBC: Recent Labs  Lab 05/15/19 1535  WBC 4.1  HGB 9.5*  HCT 29.9*  MCV 90.9  PLT 93*   Basic Metabolic Panel: Recent Labs  Lab 05/15/19 1535  NA 140  K 3.8  CL 107  CO2 23  GLUCOSE 102*  BUN 20  CREATININE 1.37*  CALCIUM 8.9   GFR: Estimated Creatinine Clearance: 50.9 mL/min (A) (by C-G formula based on SCr of 1.37 mg/dL (H)). Liver Function Tests: Recent Labs  Lab 05/15/19 1746  AST 28  ALT 16  ALKPHOS 81  BILITOT 1.0  PROT 7.7  ALBUMIN 3.5   No results for input(s): LIPASE, AMYLASE in the last 168 hours. No results for input(s): AMMONIA in the last 168 hours. Coagulation Profile: No results for input(s): INR, PROTIME in the last 168 hours. Cardiac Enzymes: Recent Labs  Lab 05/15/19 1535  TROPONINI <0.03   BNP (last 3 results) No results for input(s): PROBNP in the last 8760 hours. HbA1C: No results  for input(s): HGBA1C in the last 72 hours. CBG: No results for input(s): GLUCAP in the last 168 hours. Lipid Profile: No results for input(s): CHOL, HDL, LDLCALC, TRIG, CHOLHDL, LDLDIRECT in the last 72 hours. Thyroid Function Tests: No results for input(s): TSH, T4TOTAL, FREET4, T3FREE, THYROIDAB in the last 72 hours. Anemia Panel: No results for input(s): VITAMINB12, FOLATE, FERRITIN, TIBC, IRON, RETICCTPCT in the last 72 hours. Urine analysis:    Component Value Date/Time   COLORURINE STRAW (A) 11/22/2018 1719   APPEARANCEUR CLEAR 11/22/2018 1719   LABSPEC 1.005 11/22/2018 1719   PHURINE 5.0 11/22/2018 1719   GLUCOSEU NEGATIVE 11/22/2018 1719   HGBUR NEGATIVE 11/22/2018 1719   BILIRUBINUR NEGATIVE 11/22/2018 1719   KETONESUR NEGATIVE 11/22/2018 1719   PROTEINUR NEGATIVE 11/22/2018 1719   UROBILINOGEN 0.2 01/07/2008 1137   NITRITE NEGATIVE 11/22/2018 1719   LEUKOCYTESUR NEGATIVE 11/22/2018 1719   Sepsis Labs: @LABRCNTIP (procalcitonin:4,lacticidven:4) )No results found for this or any previous visit (from the past 240 hour(s)).   Radiological Exams on Admission: Dg Chest 2 View  Result Date: 05/15/2019 CLINICAL DATA:  One-week history of shortness of breath that worsens with exertion. Former smoker. Prior CABG. Indwelling pacemaker. EXAM: CHEST - 2 VIEW COMPARISON:  03/19/2017 and earlier, including CT chest 05/03/2017 and earlier. FINDINGS: AP ERECT and LATERAL images were obtained, and the LATERAL image is blurred by respiratory motion. Prior sternotomy for CABG. Cardiac silhouette moderately enlarged, unchanged. LEFT subclavian dual lead transvenous pacemaker unchanged and appears intact. Thoracic aorta atherosclerotic, unchanged. Hilar and mediastinal contours otherwise unremarkable. 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 Pulmonary vascularity normal without evidence of pulmonary edema currently. No  confluent airspace consolidation. No pleural effusions. Mild degenerative changes involving the thoracic spine. IMPRESSION: 1. Stable cardiomegaly. No acute cardiopulmonary disease. 2. 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 Electronically Signed   By: Evangeline Dakin M.D.   On: 05/15/2019 16:53   Dg Toe 4th Left  Result Date: 05/15/2019 CLINICAL DATA:  Pain of the fourth toe EXAM: LEFT FOURTH TOE COMPARISON:  07/30/2018 FINDINGS: There is an old osteotomy of the fifth metatarsal. No bone or joint pathology is seen affecting the fourth toe. Distal soft tissues appear somewhat irregular, possibly with some soft tissue gas. IMPRESSION: No bone or joint abnormality of the fourth toe. Question soft tissue abnormality distally, possibly with some soft tissue gas. Electronically Signed   By: Nelson Chimes M.D.   On: 05/15/2019 18:23    EKG: Independently reviewed.  A. fib RBBB inferolateral ischemic changes.  Assessment/Plan Principal Problem:   Acute right heart failure (HCC) Active Problems:   Atrial fibrillation (HCC)   Sleep apnea   Pacemaker-St.Jude   Hx of CABG   Postinflammatory pulmonary fibrosis (HCC)   Thrombocytopenia (HCC)   History of primary testicular cancer   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[place])    1. Acute right heart failure last EF measured in January 2020 was 60 to 65% with severely dilated RV.  Appreciate cardiology consult patient has been placed on Lasix 80 mg IV every 12 along with spironolactone.  Repeat 2D echo has been ordered.  Follow metabolic panel closely. 2. Lower extremity cellulitis with possible ischemic fourth toe of the left foot.  I have ordered ABI.  Patient on empiric antibiotics.  Will need to have vascular surgery consult in the morning. 3. Cirrhosis of the liver will need GI input.  Will check sonogram of the abdomen and if there is significant ascites will get  paracentesis. 4. Chronic anemia and thrombocytopenia likely from cirrhosis of the liver.  Follow CBC. 5. History of Lewy body dementia per the chart. 6. A. fib on metoprolol rate control.  Not on anticoagulation secondary history of spontaneous subdural hematoma in 2013. 7. Sleep apnea on CPAP.   DVT prophylaxis: SCDs due to thrombocytopenia and previous history of subdural hematoma. Code Status: Full code. Family Communication: Discussed with patient. Disposition Plan: Home. Consults called: Cardiology. Admission status: Inpatient.   Rise Patience MD [facility] Pager [phone].  If 7PM-7AM, please contact night-coverage [email or web address] Password [facility]  05/15/2019, 7:29 PM

## 2019-05-15 NOTE — Assessment & Plan Note (Signed)
Unclear etiology, possibly advanced Rt heart failure

## 2019-05-15 NOTE — Patient Instructions (Signed)
GO TO THE EMERGENCY DEPARTMENT

## 2019-05-15 NOTE — Assessment & Plan Note (Addendum)
H/O neuropathy.   He has seen Dr Beacher May and Dr Jannifer Franklin as an OP

## 2019-05-15 NOTE — Assessment & Plan Note (Signed)
PPM - St. Jude- he has not seen dr Lovena Le in some time, consider pacer check while in the hospital.

## 2019-05-15 NOTE — Consult Note (Addendum)
[name]  Physician Assistant  Cardiology  Progress Notes     Addendum  Encounter Date:  05/15/2019       Related encounter: Office Visit from 05/15/2019 in [facility] all [x] Manual[x] Template[x] Copied  Added by: [x] Erlene Quan, PA-C  [] Hover for details    05/15/2019 [name]   [birth year]  [id number]  Primary Physician [name], MD Primary Cardiologist: Dr [name] EP: Dr Lovena Le  HPI: The patient is a pleasant 78 year old male followed by Dr. [name] who was placed on my schedule today for evaluation of multiple complaints.  He had bypass grafting x3 in 2009 as well as a Maze procedure.  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  He subsequently had issues with Multaq and this was discontinued, Dr. Jacalyn Lefevre notes indicate he is in chronic atrial fibrillation.  He had a spontaneous subdural hematoma in 2013 and is not felt to be a candidate for anticoagulation.  He has neuropathy and frequent falls as well. His last functional study was an echocardiogram January 2020 which showed his ejection fraction to be 60 to 65% with RV enlargement, severe TR, and a PA pressure of 39 mmHg.  The patient has been to several physicians over the last couple months.  He has had gradual progressive decline.  He has sleep apnea and for a time had not been on his CPAP.  He was falling asleep frequently during the day.  He also complains of  increasing dyspnea on exertion and increasing lower extremity edema.  Dr. [name] saw him in April and increased his diuretic and his beta-blocker.  The patient and his wife say today that had no effect.  They said today when he got up to go use the bathroom this morning he was significantly short of breath by the time he got there.  He also had a fall last week and has ecchymosis on his left rib cage.  He says he does not know why he falls, his legs just "give out".  He has seen  Dr. Brett Fairy as well as Dr [name] ([facility]).  The patient and his wife indicated that they are frustrated at the patient's persistent decline and that we are unable to make an impact.  The patient does say he is short of breath with exertion but denies orthopnea.  He is not had chest pain.  He is unaware of his heart racing, his heart rate in the office is 102.  His main complaint is dyspnea on exertion and lower extremity edema.  He has chronic venous changes in his lower extremities and pitting edema.  His wife tells me that earlier this week he had serous drainage from his feet. The patient tells me he has had "dry heaves" the last 48 hours.          Current Outpatient Medications  Medication Sig Dispense Refill  . allopurinol (ZYLOPRIM) 300 MG tablet Take 1 tablet by mouth daily.    . colchicine 0.6 MG tablet Take 0.6-1.2 mg by mouth See admin instructions. Take 1.2 mg by mouth then 0.6 mg one hour later as needed for gout flares/ as directed.  1  . ferrous sulfate 325 (65 FE) MG tablet Take 325 mg by mouth daily with breakfast.    . furosemide (LASIX) 40 MG tablet Take 2 tablets (80 mg total) by mouth 2 (two) times daily. 360 tablet 3  . metoprolol tartrate (  LOPRESSOR) 50 MG tablet Take 1.5 tablets (75 mg total) by mouth 2 (two) times daily. 270 tablet 3  . MYRBETRIQ 50 MG TB24 tablet TAKE 1 TABLET BY MOUTH EVERY DAY 30 tablet 3  . pantoprazole (PROTONIX) 40 MG tablet Take 40 mg by mouth daily.    . potassium chloride SA (K-DUR,KLOR-CON) 20 MEQ tablet Take 20 mEq by mouth daily.      . rosuvastatin (CRESTOR) 40 MG tablet Take 1 tablet (40 mg total) by mouth daily. 90 tablet 2  . sertraline (ZOLOFT) 100 MG tablet Take 200 mg by mouth daily.     . Tamsulosin HCl (FLOMAX) 0.4 MG CAPS Take 0.4 mg by mouth at bedtime.      No current facility-administered medications for this visit.          Allergies  Allergen Reactions  . Lipitor [Atorvastatin]   . Nsaids   . Warfarin  And Related   . Allopurinol Itching    May or may not be allergic to this (If the patient should find out he is NOT allergic, please omit this entry.)  . Enbrel [Etanercept] Itching    May or may not be allergic to this (If the patient should find out he is NOT allergic, please omit this entry.)        Past Medical History:  Diagnosis Date  . (HFpEF) heart failure with preserved ejection fraction ([place])    a. 05/2013 Echo: EF 55%, mild LVH, diast dysfxn, Ao sclerosis, mildly dil LA, RV dysfxn (poorly visualized), PASP 56mmHg;  b. 03/2017 Echo: EF 55-60%, no rwma, triv MR, mildly dil RV, mod TR, PASP 74mmHg.  . Atrial fibrillation [facility])    s/p [facility] Maze 1/09;  Multaq Rx d/c'd in 2014 due to pulmo fibrosis;  coumadin d/c'd in 2014 due to spontaneous subdural hematoma  . BPH (benign prostatic hyperplasia)   . CAD (coronary artery disease), native coronary artery    a. s/p CABG 12/2007;  b. Myoview 12/2011: EF 66%, no scar or ischemia; normal.  . DM (diabetes mellitus) ([place])   . Hyperlipidemia type II   . Hypertension   . 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 OSA (obstructive sleep apnea)   . Pacemaker    PPM - St. Jude  . Peripheral neuropathy 07/31/2018  . Pulmonary fibrosis ([place])    Multaq d/c'd 7/14  . Subdural hematoma ([place]) 07/2012   spontaneous;  coumadin d/c'd => no longer a candidate for anticoagulation    Social History        Socioeconomic History  . Marital status: Married    Spouse name: [name]  . Number of children: 2  . Years of education: Not on file  . Highest education level: Not on file  Occupational History  . Occupation: Retired- IT trainer  Social Needs  . Financial resource strain: Not on file  . Food insecurity:    Worry: Not on file    Inability: Not on file  . Transportation needs:    Medical: Not on file    Non-medical: Not on file  Tobacco Use  . Smoking status: Former Smoker     Last attempt to quit: 02/21/1991    Years since quitting: 28.2  . Smokeless tobacco: Never Used  Substance and Sexual Activity  . Alcohol use: No    Alcohol/week: 0.0 standard drinks  . Drug use: No  . Sexual activity: Not Currently  Lifestyle  . Physical activity:  Days per week: Not on file    Minutes per session: Not on file  . Stress: Not on file  Relationships  . Social connections:    Talks on phone: Not on file    Gets together: Not on file    Attends religious service: Not on file    Active member of club or organization: Not on file    Attends meetings of clubs or organizations: Not on file    Relationship status: Not on file  . Intimate partner violence:    Fear of current or ex partner: Not on file    Emotionally abused: Not on file    Physically abused: Not on file    Forced sexual activity: Not on file  Other Topics Concern  . Not on file  Social History Narrative   Lives with wife   Right handed    Married with two children.     He is a Engineer, structural.            Family History  Problem Relation Age of Onset  . Heart disease Father   . Heart attack Father   . Heart failure Father   . Heart disease Mother   . Alzheimer's disease Mother   . Dementia Mother      Review of Systems: General: negative for chills, fever, night sweats or weight changes.  Cardiovascular: negative for chest pain, orthopnea, palpitations Dermatological: negative for rash Respiratory: negative for cough or wheezing Urologic: negative for hematuria Abdominal: remarkable for nasuea and dry heaves.negative diarrhea, bright red blood per rectum, melena, or hematemesis Neurologic: negative for visual changes, syncope, or dizziness All other systems reviewed and are otherwise negative except as noted above.    Blood pressure 102/64, pulse (!) 102, temperature (!) 97.4 F (36.3 C), height 5' 8.5" (1.74 m), weight 202 lb (91.6  kg).  General appearance: alert, cooperative, no distress and mildly obese Neck: no JVD Lungs: clear Heart: regular rate and rhythm and 2/6 systolic murmur LSB, ? soft S3 Abdomen: liver down 4 fingers from Rt CM Extremities: chronic skin changes with rudy edema Pulses: diminnished Skin: pale cool dry Neurologic: grossly intact, awake alert, appropriate  EKG Tachycardia- 102, no clear P-wave or pacer spike. RBBB (old)  ASSESSMENT AND PLAN:   DOE (dyspnea on exertion) Unclear etiology, possibly advanced Rt heart failure  Hx of CABG CABG x 3 and MAZE 2009- Myoview low risk May 2018  OSA (obstructive sleep apnea) Pt reports compliance with C-pap of late  Recurrent falls H/O neuropathy.   He has seen Dr Beacher May and Dr Jannifer Franklin as an OP  Pacemaker-St.Jude PPM - St. Jude- he has not seen dr Lovena Le in some time, consider pacer check while in the hospital.   [name] (monoclonal gammopathy of unknown significance) This did not seem to be an significant issue- see Dr Elpidio Eric notes  Atrial fibrillation ([place]) Pt unable to tolerate Multaq- unable to taken anticoagulation secondary to spontaneous SDH  Severe tricuspid regurgitation Pt has Rt heart failure on exam   PLAN  Discussed with Dr Ellyn Hack in the office today. The reasons for the patient decline is not readily apparent. The patient has multiple issues and has not had any improvement as an OP.  We feel it best to admit for in patient evaluation and treatment.  He suggested the pt present to the ED at [facility] for evaluation and admission to the Hospitalist service if appropriate. Cardiology will be notified to consult.   Kerin Ransom  PA-C 05/15/2019 2:45 PM     As above, patient seen and examined.  Briefly he is a 78 year old male with past medical history of coronary artery disease status post coronary artery bypass graft as well as maze procedure, pacemaker, previous discontinuation of multaq for possible contribution to  pulmonary fibrosis, testicular cancer, cirrhosis, prior spontaneous subdural hematoma not felt to be a candidate for anticoagulation for evaluation of possible right-sided congestive heart failure at request of Gean Birchwood MD.  Patient states he has had progressive weakness with exertion over the past 6 months.  He also describes dyspnea on exertion for the same timeframe.  There is no orthopnea or PND but he has had worsening bilateral lower extremity edema.  He denies chest pain or palpitations.  He has lost 20 pounds over the past 3 to 4 months by his report.  He has fallen several times recently but states he lost his balance.  He denies syncope.  He was seen today and is being admitted and cardiology asked to evaluate. On exam patient is chronically ill-appearing.  There appears to be hepatomegaly on examination and possible ascites.  He has 2+ edema to his hips bilaterally and chronic lower extremity skin changes.  There is possible small amount of necrosis on fourth digit of the left lower extremity. Electrocardiogram shows question atrial fibrillation, right bundle branch block and inferior lateral T wave inversion. BUN 20 and creatinine 1.37.  Hemoglobin 9.5 with MCV 90.9. Chest x-ray shows chronic interstitial lung disease possibly pulmonary fibrosis.  1 acute on chronic right heart failure-patient is volume overloaded on examination.  This is likely secondary to right-sided congestive heart failure.  We will begin Lasix 80 mg IV twice daily.  Add spironolactone 25 mg daily.  Follow renal function closely.  Repeat echocardiogram.  Right heart failure likely multifactorial including pulmonary venous hypertension, sleep apnea, and pulmonary fibrosis.  2 question atrial fibrillation-would continue preadmission dose of metoprolol for rate control.  We will have his device interrogated to verify rhythm.  Patient is not a candidate for anticoagulation given history of spontaneous subdural hematoma.     3 cirrhosis-patient is noted to have cirrhosis and splenomegaly on previous CT scan.  His liver is palpable on examination to 5 cm below costal margin.  Possible ascites on examination.  Etiology unclear but could be secondary to right heart failure.  Would ask gastroenterology to further assess.  4 possible cellulitis lower extremities and possible ischemic fourth digit left lower extremity-May need antibiotics.  Will leave to hospitalist service.  5 prior pacemaker-we will have device interrogated.  6 anemia/thrombocytopenia-chronic.  This is felt to be secondary to cirrhosis/splenomegaly and anemia of chronic disease/renal insufficiency.  He is followed by hematology as an outpatient.  Kirk Ruths, MD

## 2019-05-15 NOTE — ED Provider Notes (Signed)
[facility] EMERGENCY DEPARTMENT Provider Note   CSN: [id number] Arrival date & time: 05/15/19  1522    History   Chief Complaint Chief Complaint  Patient presents with  . Shortness of Breath    HPI [name]. is a 78 y.o. male.     Pt presents to the ED today with multiple issues.  The pt mainly has increasing sob, but has also been falling a lot.  He has a hx of afib, but is not on anticoagulation due to hx of a spontaneous SDH.  The pt has a hx of CAD with CABG in 2009.  He also has lower extremity edema with left 4th toe pain.  The pt has been nauseous.  He went to cardiology today for an appointment and they sent him over here for admission to medicine with a cardiology consult.  The pt does have a pacemaker which has not been interrogated in awhile.  Cards wants to do that in the hospital.     Past Medical History:  Diagnosis Date  . (HFpEF) heart failure with preserved ejection fraction ([place])    a. 05/2013 Echo: EF 55%, mild LVH, diast dysfxn, Ao sclerosis, mildly dil LA, RV dysfxn (poorly visualized), PASP 38mmHg;  b. 03/2017 Echo: EF 55-60%, no rwma, triv MR, mildly dil RV, mod TR, PASP 101mmHg.  . Atrial fibrillation [facility])    s/p Cox Maze 1/09;  Multaq Rx d/c'd in 2014 due to pulmo fibrosis;  coumadin d/c'd in 2014 due to spontaneous subdural hematoma  . BPH (benign prostatic hyperplasia)   . CAD (coronary artery disease), native coronary artery    a. s/p CABG 12/2007;  b. Myoview 12/2011: EF 66%, no scar or ischemia; normal.  . DM (diabetes mellitus) ([place])   . Hyperlipidemia type II   . Hypertension   . 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 OSA (obstructive sleep apnea)   . Pacemaker    PPM - St. Jude  . Peripheral neuropathy 07/31/2018  . 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 Subdural hematoma ([place]) 07/2012   spontaneous;  coumadin d/c'd => no longer a candidate for anticoagulation    Patient  Active Problem List   Diagnosis Date Noted  . DOE (dyspnea on exertion) 05/15/2019  . Severe tricuspid regurgitation 05/15/2019  . Excessive daytime sleepiness 02/11/2019  . 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[place]) 02/11/2019  . Iron deficiency anemia 02/04/2019  . Poor memory 12/16/2018  . Deficiency anemia 11/19/2018  . Other fatigue 11/19/2018  . History of elevated PSA 11/19/2018  . Prostate cancer screening 11/19/2018  . Prostate CA ([place]) 11/19/2018  . Pancytopenia, acquired ([place]) 08/13/2018  . Recurrent falls 08/13/2018  . 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 Peripheral neuropathy 07/31/2018  . 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 Anemia, chronic disease 01/28/2018  . History of skin cancer 07/27/2017  . Fatty liver disease, nonalcoholic 35/57/3220  . Thrombocytopenia ([place]) 11/27/2016  . Lung nodule 04/23/2014  . 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 Long term (current) use of anticoagulants 11/22/2011  . Abdominal bruit 03/07/2011  . Pacemaker-St.Jude   . Hx of CABG   . Hyperlipidemia type II   . OSA (obstructive sleep apnea)   . PACEMAKER, PERMANENT 08/17/2010  . DIABETES MELLITUS 08/16/2010  . HYPERLIPIDEMIA 08/16/2010  . Obesity 08/16/2010  . Essential hypertension 08/16/2010  . Atrial fibrillation ([place]) 08/16/2010  .  IRREGULAR HEART RATE 08/16/2010  . SLEEP APNEA 08/16/2010  . BENIGN PROSTATIC HYPERTROPHY, HX OF 08/16/2010    Past Surgical History:  Procedure Laterality Date  . APPENDECTOMY    . CHOLECYSTECTOMY    . CORONARY ARTERY BYPASS GRAFT     x3 (left internal mammary artery to distal left anterior descending coronary artery, saphenous vain graft to second circumflex marginal branch, saphenous vain graft to posterior descending coronary artery, endoscopic saphenous vain harvest from right thigh) and modified Cox - Maze IV procedure.  [name],MD. Electronically signed  CHO/MEDQ D: 01/09/2008/ JOB: [id number] cc:  [name]  . [name]  07/30/2012   Procedure: CRANIOTOMY HEMATOMA EVACUATION SUBDURAL;  Surgeon: [name], MD;  Location: [facility]URO ORS;  Service: Neurosurgery;  Laterality: Right;  Right craniotomy for evacuation of subdural hematoma  . FOOT SURGERY    . HERNIA REPAIR    . ORCHIECTOMY     Left  /  testicular cancer  . PACEMAKER PLACEMENT     PPM - St. Jude        Home Medications    Prior to Admission medications   Medication Sig Start Date End Date Taking? Authorizing Provider  allopurinol (ZYLOPRIM) 300 MG tablet Take 1 tablet by mouth daily. 06/21/16   [provider]  colchicine 0.6 MG tablet Take 0.6-1.2 mg by mouth See admin instructions. Take 1.2 mg by mouth then 0.6 mg one hour later as needed for gout flares/ as directed. 05/13/15   [provider]  ferrous sulfate 325 (65 FE) MG tablet Take 325 mg by mouth daily with breakfast.    [provider]  furosemide (LASIX) 40 MG tablet Take 2 tablets (80 mg total) by mouth 2 (two) times daily. 03/31/19   [name], MD  metoprolol tartrate (LOPRESSOR) 50 MG tablet Take 1.5 tablets (75 mg total) by mouth 2 (two) times daily. 03/31/19   [name], MD  MYRBETRIQ 50 MG TB24 tablet TAKE 1 TABLET BY MOUTH EVERY DAY 04/21/19   Kathrynn Ducking, MD  pantoprazole (PROTONIX) 40 MG tablet Take 40 mg by mouth daily. 02/22/17   [provider]  potassium chloride SA (K-DUR,KLOR-CON) 20 MEQ tablet Take 20 mEq by mouth daily.      [provider]  rosuvastatin (CRESTOR) 40 MG tablet Take 1 tablet (40 mg total) by mouth daily. 12/25/18   [name], MD  sertraline (ZOLOFT) 100 MG tablet Take 200 mg by mouth daily.     [provider]  Tamsulosin HCl (FLOMAX) 0.4 MG CAPS Take 0.4 mg by mouth at bedtime.     [provider]    Family History Family History  Problem Relation Age of Onset  . Heart disease Father   .  Heart attack Father   . Heart failure Father   . Heart disease Mother   . Alzheimer's disease Mother   . Dementia Mother     Social History Social History   Tobacco Use  . Smoking status: Former Smoker    Last attempt to quit: 02/21/1991    Years since quitting: 28.2  . Smokeless tobacco: Never Used  Substance Use Topics  . Alcohol use: No    Alcohol/week: 0.0 standard drinks  . Drug use: No     Allergies   Lipitor [atorvastatin]; Nsaids; Warfarin and related; Allopurinol; and Enbrel [etanercept]   Review of Systems Review of Systems  Respiratory: Positive for shortness of breath.  Musculoskeletal:       Left 4th toe pain  Neurological: Positive for weakness.  All other systems reviewed and are negative.    Physical Exam Updated Vital Signs BP (!) 130/103   Pulse 95   Temp 98.5 F (36.9 C) (Oral)   Resp 17   Ht 5' 8.5" (1.74 m)   Wt 91.6 kg   SpO2 98%   BMI 30.26 kg/m   Physical Exam Vitals signs and nursing note reviewed.  Constitutional:      Appearance: He is well-developed. He is obese.  HENT:     Head: Normocephalic and atraumatic.     Mouth/Throat:     Mouth: Mucous membranes are moist.     Pharynx: Oropharynx is clear.  Eyes:     Extraocular Movements: Extraocular movements intact.     Pupils: Pupils are equal, round, and reactive to light.  Neck:     Musculoskeletal: Normal range of motion and neck supple.  Cardiovascular:     Rate and Rhythm: Tachycardia present. Rhythm irregular.  Pulmonary:     Effort: Pulmonary effort is normal.     Breath sounds: Normal breath sounds.  Abdominal:     General: Bowel sounds are normal.     Palpations: Abdomen is soft. There is hepatomegaly.  Musculoskeletal: Normal range of motion.  Skin:    General: Skin is warm.     Capillary Refill: Capillary refill takes less than 2 seconds.     Comments: Chronic skin changes bilateral lower extremities Cellulitis left 4th toe  Neurological:     General: No  focal deficit present.     Mental Status: He is alert and oriented to person, place, and time.  Psychiatric:        Mood and Affect: Mood normal.        Behavior: Behavior normal.      ED Treatments / Results  Labs (all labs ordered are listed, but only abnormal results are displayed) Labs Reviewed  BASIC METABOLIC PANEL - Abnormal; Notable for the following components:      Result Value   Glucose, Bld 102 (*)    Creatinine, Ser 1.37 (*)    GFR calc non Af Amer 50 (*)    GFR calc Af Amer 58 (*)    All other components within normal limits  CBC - Abnormal; Notable for the following components:   RBC 3.29 (*)    Hemoglobin 9.5 (*)    HCT 29.9 (*)    RDW 16.2 (*)    Platelets 93 (*)    All other components within normal limits  SARS CORONAVIRUS 2 (HOSPITAL ORDER, [place])  CULTURE, BLOOD (ROUTINE X 2)  CULTURE, BLOOD (ROUTINE X 2)  TROPONIN I  HEPATIC FUNCTION PANEL  LACTIC ACID, PLASMA  URINALYSIS, ROUTINE W REFLEX MICROSCOPIC  LACTIC ACID, PLASMA    EKG EKG Interpretation  Date/Time:  Thursday May 15 2019 15:43:24 EDT Ventricular Rate:  101 PR Interval:    QRS Duration: 140 QT Interval:  420 QTC Calculation: 544 R Axis:   62 Text Interpretation:  Undetermined rhythm Right bundle branch block T wave abnormality, consider inferolateral ischemia Abnormal ECG No significant change since last tracing Confirmed by Isla Pence [phone]) on 05/15/2019 4:55:17 PM   Radiology Dg Chest 2 View  Result Date: 05/15/2019 CLINICAL DATA:  One-week history of shortness of breath that worsens with exertion. Former smoker. Prior CABG. Indwelling pacemaker. EXAM: CHEST - 2 VIEW COMPARISON:  03/19/2017  and earlier, including CT chest 05/03/2017 and earlier. FINDINGS: AP ERECT and LATERAL images were obtained, and the LATERAL image is blurred by respiratory motion. Prior sternotomy for CABG. Cardiac silhouette moderately enlarged, unchanged. LEFT subclavian  dual lead transvenous pacemaker unchanged and appears intact. Thoracic aorta atherosclerotic, unchanged. Hilar and mediastinal contours otherwise unremarkable. 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 Pulmonary vascularity normal without evidence of pulmonary edema currently. No confluent airspace consolidation. No pleural effusions. Mild degenerative changes involving the thoracic spine. IMPRESSION: 1. Stable cardiomegaly. No acute cardiopulmonary disease. 2. 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 Electronically Signed   By: Evangeline Dakin M.D.   On: 05/15/2019 16:53   Dg Toe 4th Left  Result Date: 05/15/2019 CLINICAL DATA:  Pain of the fourth toe EXAM: LEFT FOURTH TOE COMPARISON:  07/30/2018 FINDINGS: There is an old osteotomy of the fifth metatarsal. No bone or joint pathology is seen affecting the fourth toe. Distal soft tissues appear somewhat irregular, possibly with some soft tissue gas. IMPRESSION: No bone or joint abnormality of the fourth toe. Question soft tissue abnormality distally, possibly with some soft tissue gas. Electronically Signed   By: Nelson Chimes M.D.   On: 05/15/2019 18:23    Procedures Procedures (including critical care time)  Medications Ordered in ED Medications  sodium chloride flush (NS) 0.9 % injection 3 mL (has no administration in time range)  furosemide (LASIX) injection 80 mg (80 mg Intravenous Given 05/15/19 1903)  spironolactone (ALDACTONE) tablet 25 mg (has no administration in time range)  vancomycin (VANCOCIN) IVPB 1000 mg/200 mL premix (1,000 mg Intravenous New Bag/Given 05/15/19 1911)     Initial Impression / Assessment and Plan / ED Course  I have reviewed the triage vital signs and the nursing notes.  Pertinent labs & imaging results that were available during my care of the patient were reviewed by me  and considered in my medical decision making (see chart for details).  Labs and radiographic results reviewed.  Pt given vancomycin for his foot cellulitis.   Pt d/w Dr. [name] (cardiology) who came to see pt in the ED.  He ordered lasix 80 mg iv.  Pt d/w Dr. Hal Hope (triad) for admission.  Final Clinical Impressions(s) / ED Diagnoses   Final diagnoses:  SOB (shortness of breath)  Cellulitis of fourth toe of left foot    ED Discharge Orders    None       Isla Pence, MD 05/15/19 Lurena Nida

## 2019-05-16 ENCOUNTER — Inpatient Hospital Stay (HOSPITAL_COMMUNITY): Payer: Medicare Other

## 2019-05-16 DIAGNOSIS — I50811 Acute right heart failure: Secondary | ICD-10-CM

## 2019-05-16 LAB — CBC WITH DIFFERENTIAL/PLATELET
Abs Immature Granulocytes: 0.02 10*3/uL (ref 0.00–0.07)
Basophils Absolute: 0 10*3/uL (ref 0.0–0.1)
Basophils Relative: 1 %
Eosinophils Absolute: 0.2 10*3/uL (ref 0.0–0.5)
Eosinophils Relative: 4 %
HCT: 28.9 % — ABNORMAL LOW (ref 39.0–52.0)
Hemoglobin: 9.5 g/dL — ABNORMAL LOW (ref 13.0–17.0)
Immature Granulocytes: 0 %
Lymphocytes Relative: 17 %
Lymphs Abs: 0.8 10*3/uL (ref 0.7–4.0)
MCH: 29.2 pg (ref 26.0–34.0)
MCHC: 32.9 g/dL (ref 30.0–36.0)
MCV: 88.9 fL (ref 80.0–100.0)
Monocytes Absolute: 0.3 10*3/uL (ref 0.1–1.0)
Monocytes Relative: 6 %
Neutro Abs: 3.3 10*3/uL (ref 1.7–7.7)
Neutrophils Relative %: 72 %
Platelets: 100 10*3/uL — ABNORMAL LOW (ref 150–400)
RBC: 3.25 MIL/uL — ABNORMAL LOW (ref 4.22–5.81)
RDW: 16.1 % — ABNORMAL HIGH (ref 11.5–15.5)
WBC: 4.5 10*3/uL (ref 4.0–10.5)
nRBC: 0 % (ref 0.0–0.2)

## 2019-05-16 LAB — BASIC METABOLIC PANEL
Anion gap: 12 (ref 5–15)
BUN: 17 mg/dL (ref 8–23)
CO2: 27 mmol/L (ref 22–32)
Calcium: 9.1 mg/dL (ref 8.9–10.3)
Chloride: 102 mmol/L (ref 98–111)
Creatinine, Ser: 1.29 mg/dL — ABNORMAL HIGH (ref 0.61–1.24)
GFR calc Af Amer: >60 mL/min (ref >60)
GFR calc non Af Amer: 53 mL/min — ABNORMAL LOW (ref >60)
Glucose, Bld: 92 mg/dL (ref 70–99)
Potassium: 3.4 mmol/L — ABNORMAL LOW (ref 3.5–5.1)
Sodium: 141 mmol/L (ref 135–145)

## 2019-05-16 LAB — BRAIN NATRIURETIC PEPTIDE: B Natriuretic Peptide: 289.1 pg/mL — ABNORMAL HIGH (ref 0.0–100.0)

## 2019-05-16 LAB — HEPATIC FUNCTION PANEL
ALT: 16 U/L (ref 0–44)
AST: 24 U/L (ref 15–41)
Albumin: 3.4 g/dL — ABNORMAL LOW (ref 3.5–5.0)
Alkaline Phosphatase: 80 U/L (ref 38–126)
Bilirubin, Direct: 0.3 mg/dL — ABNORMAL HIGH (ref 0.0–0.2)
Indirect Bilirubin: 1.1 mg/dL — ABNORMAL HIGH (ref 0.3–0.9)
Total Bilirubin: 1.4 mg/dL — ABNORMAL HIGH (ref 0.3–1.2)
Total Protein: 7 g/dL (ref 6.5–8.1)

## 2019-05-16 LAB — TSH: TSH: 2.022 u[IU]/mL (ref 0.350–4.500)

## 2019-05-16 LAB — MAGNESIUM: Magnesium: 1.6 mg/dL — ABNORMAL LOW (ref 1.7–2.4)

## 2019-05-16 MED ORDER — SODIUM CHLORIDE 0.9 % IV SOLN
2.0000 g | Freq: Once | INTRAVENOUS | Status: AC
  Administered 2019-05-16: 2 g via INTRAVENOUS
  Filled 2019-05-16 (×2): qty 2

## 2019-05-16 MED ORDER — SODIUM CHLORIDE 0.9 % IV SOLN
2.0000 g | Freq: Two times a day (BID) | INTRAVENOUS | Status: DC
  Administered 2019-05-16 – 2019-05-19 (×7): 2 g via INTRAVENOUS
  Filled 2019-05-16 (×8): qty 2

## 2019-05-16 NOTE — Progress Notes (Signed)
Pt has home CPAP at bedside.  

## 2019-05-16 NOTE — Evaluation (Signed)
Physical Therapy Evaluation Patient Details Name: [name]. MRN: [id number] DOB: [birth year] Today's Date: 05/16/2019   History of Present Illness  Patient is a 78 y/o male who presents with BLE swelling and SOB. Found to have cellulitis with possible ischemic fourth toe on left foot. Admitted with acute heart failure. EKG- Afib with RBBB. PMH includes SDH, pulmonary fibrosis, HTN, HLD, CAD, A-fib.  Clinical Impression  Patient presents with generalized weakness, dyspnea on exertion, impaired balance, decreased endurance, impaired safety awareness and impaired mobility s/p above. Pt lives with wife and reports using [employer] for ambulation for household distances and rollator for community distances. Reports multiple falls at home, "too many to count." Recommend use of RW instead of SPC for safety. Tolerated transfers and gait training with Min A for balance/safety. Pt taking hands off RW with LOB. Will follow acutely to maximize independence and mobility prior to return home.    Follow Up Recommendations Home health PT;Supervision for mobility/OOB    Equipment Recommendations  None recommended by PT    Recommendations for Other Services       Precautions / Restrictions Precautions Precautions: Fall Restrictions Weight Bearing Restrictions: No      Mobility  Bed Mobility Overal bed mobility: Needs Assistance Bed Mobility: Rolling;Sidelying to Sit Rolling: Min guard Sidelying to sit: Min guard;HOB elevated       General bed mobility comments: Use of rail to get to EOB.  Transfers Overall transfer level: Needs assistance Equipment used: Rolling walker (2 wheeled) Transfers: Sit to/from Stand Sit to Stand: Min assist         General transfer comment: Assist to power to standing with cues for hand placement and use of momentum. Transferred to chair post ambulation.  Ambulation/Gait Ambulation/Gait assistance: Min assist Gait Distance (Feet): 100 Feet Assistive  device: Rolling walker (2 wheeled) Gait Pattern/deviations: Step-through pattern;Wide base of support;Trunk flexed;Decreased stride length Gait velocity: decreased   General Gait Details: Slow, unsteady gait with pt taking hands on RW to wipe face; Min A due to LOB, difficulty with turns. Sp02 >93% on RA. 2/4 DOE. Needs to stop talking and breathe.  Stairs            Wheelchair Mobility    Modified Rankin (Stroke Patients Only)       Balance Overall balance assessment: Needs assistance Sitting-balance support: Feet supported;No upper extremity supported Sitting balance-Leahy Scale: Good     Standing balance support: During functional activity Standing balance-Leahy Scale: Poor Standing balance comment: Requires UE support in standing.                             Pertinent Vitals/Pain Pain Assessment: No/denies pain    Home Living Family/patient expects to be discharged to:: Private residence Living Arrangements: Spouse/significant other Available Help at Discharge: Family;Available 24 hours/day Type of Home: House Home Access: Stairs to enter Entrance Stairs-Rails: Right;Left;Can reach both Entrance Stairs-Number of Steps: 3 Home Layout: One level Home Equipment: Walker - 2 wheels;Wheelchair - [employer];Shower seat - built in;Cane - single point      Prior Function Level of Independence: Independent with assistive device(s)         Comments: Uses SPC for ambulation. Uses RW outside. Lots of falls at home, "hunny, I cannot even count that high."  Drives. Reports working in the yard.     Hand Dominance   Dominant Hand: Right    Extremity/Trunk Assessment   Upper Extremity  Assessment Upper Extremity Assessment: Defer to OT evaluation    Lower Extremity Assessment Lower Extremity Assessment: Generalized weakness    Cervical / Trunk Assessment Cervical / Trunk Assessment: Kyphotic  Communication   Communication: HOH   Cognition Arousal/Alertness: Awake/alert Behavior During Therapy: WFL for tasks assessed/performed Overall Cognitive Status: No family/caregiver present to determine baseline cognitive functioning Area of Impairment: Safety/judgement;Problem solving                         Safety/Judgement: Decreased awareness of safety;Decreased awareness of deficits   Problem Solving: Slow processing General Comments: Pt reports multiple falls at home yet continues to ask how he can bend over and pick up 25 lb bag in the yard. Taking hand off RW during walking with LOB.      General Comments      Exercises     Assessment/Plan    PT Assessment Patient needs continued PT services  PT Problem List Decreased strength;Decreased balance;Decreased cognition;Cardiopulmonary status limiting activity;Decreased knowledge of use of DME;Decreased mobility;Decreased safety awareness;Decreased activity tolerance       PT Treatment Interventions Functional mobility training;Balance training;Patient/family education;Gait training;Therapeutic activities;Therapeutic exercise;Stair training;DME instruction    PT Goals (Current goals can be found in the Care Plan section)  Acute Rehab PT Goals Patient Stated Goal: to go home PT Goal Formulation: With patient Time For Goal Achievement: 05/30/19 Potential to Achieve Goals: Good    Frequency Min 3X/week   Barriers to discharge        Co-evaluation               AM-PAC PT "6 Clicks" Mobility  Outcome Measure Help needed turning from your back to your side while in a flat bed without using bedrails?: A Little Help needed moving from lying on your back to sitting on the side of a flat bed without using bedrails?: A Little Help needed moving to and from a bed to a chair (including a wheelchair)?: A Little Help needed standing up from a chair using your arms (e.g., wheelchair or bedside chair)?: A Little Help needed to walk in hospital room?: A  Little Help needed climbing 3-5 steps with a railing? : A Lot 6 Click Score: 17    End of Session Equipment Utilized During Treatment: Gait belt Activity Tolerance: Patient limited by fatigue Patient left: in chair;with call bell/phone within reach;with chair alarm set Nurse Communication: Mobility status PT Visit Diagnosis: Difficulty in walking, not elsewhere classified (R26.2);Muscle weakness (generalized) (M62.81);Unsteadiness on feet (R26.81)    Time: 8280-0349 PT Time Calculation (min) (ACUTE ONLY): 31 min   Charges:   PT Evaluation $PT Eval Low Complexity: 1 Low PT Treatments $Gait Training: 8-22 mins        Wray Kearns, [place], DPT Acute Rehabilitation Services Pager [phone] Office [place] 05/16/2019, 2:36 PM

## 2019-05-16 NOTE — Progress Notes (Signed)
  Echocardiogram 2D Echocardiogram was attempted but the patient was going to ultrasound.   Jennette Dubin 05/16/2019, 11:22 AM

## 2019-05-16 NOTE — Progress Notes (Addendum)
Progress Note  Patient Name: [name]. Date of Encounter: 05/16/2019  Primary Cardiologist: Kirk Ruths, MD   Subjective   No CP or dyspnea; bilateral foot pain  Inpatient Medications    Scheduled Meds: . allopurinol  300 mg Oral Daily  . ferrous sulfate  325 mg Oral Q breakfast  . furosemide  80 mg Intravenous BID  . metoprolol tartrate  75 mg Oral BID  . mirabegron ER  50 mg Oral Daily  . pantoprazole  40 mg Oral Daily  . potassium chloride SA  20 mEq Oral Daily  . rosuvastatin  40 mg Oral Daily  . sertraline  200 mg Oral Daily  . sodium chloride flush  3 mL Intravenous Once  . spironolactone  25 mg Oral Daily  . tamsulosin  0.4 mg Oral QHS   Continuous Infusions: . ceFEPime (MAXIPIME) IV    . ceFEPime (MAXIPIME) IV    . vancomycin     PRN Meds: acetaminophen **OR** acetaminophen, ondansetron **OR** ondansetron (ZOFRAN) IV   Vital Signs    Vitals:   05/16/19 0117 05/16/19 0640 05/16/19 0649 05/16/19 0824  BP: (!) 113/93 (!) 114/53  105/72  Pulse: 92 (!) 54  93  Resp: 20 20  20   Temp: 98.2 F (36.8 C) 98 F (36.7 C)  98.2 F (36.8 C)  TempSrc: Oral Oral  Oral  SpO2: 99% 98%  99%  Weight:   85.2 kg   Height:        Intake/Output Summary (Last 24 hours) at 05/16/2019 0903 Last data filed at 05/16/2019 0650 Gross per 24 hour  Intake 390 ml  Output 2850 ml  Net -2460 ml   Last 3 Weights 05/16/2019 05/15/2019 05/15/2019  Weight (lbs) 187 lb 12.8 oz 201 lb 15.1 oz 202 lb  Weight (kg) 85.186 kg 91.6 kg 91.627 kg      Telemetry    Possible atrial fibrillation; intermittent atrial pacing - Personally Reviewed  Physical Exam   GEN: No acute distress.   Neck: supple Cardiac: RRR Respiratory: CTA GI: Soft, positive hepatomegaly, positive ascites MS: 1+ edema; chronic skin changes Neuro:  Nonfocal  Psych: Normal affect   Labs    Chemistry Recent Labs  Lab 05/15/19 1535 05/15/19 1746 05/16/19 0609  NA 140  --  141  K 3.8  --  3.4*   CL 107  --  102  CO2 23  --  27  GLUCOSE 102*  --  92  BUN 20  --  17  CREATININE 1.37*  --  1.29*  CALCIUM 8.9  --  9.1  PROT  --  7.7 7.0  ALBUMIN  --  3.5 3.4*  AST  --  28 24  ALT  --  16 16  ALKPHOS  --  81 80  BILITOT  --  1.0 1.4*  GFRNONAA 50*  --  53*  GFRAA 58*  --  >60  ANIONGAP 10  --  12     Hematology Recent Labs  Lab 05/15/19 1535 05/16/19 0609  WBC 4.1 4.5  RBC 3.29* 3.25*  HGB 9.5* 9.5*  HCT 29.9* 28.9*  MCV 90.9 88.9  MCH 28.9 29.2  MCHC 31.8 32.9  RDW 16.2* 16.1*  PLT 93* 100*    Cardiac Enzymes Recent Labs  Lab 05/15/19 1535  TROPONINI <0.03    BNP Recent Labs  Lab 05/16/19 0609  BNP 289.1*     Radiology    Dg Chest 2 View  Result  Date: 05/15/2019 CLINICAL DATA:  One-week history of shortness of breath that worsens with exertion. Former smoker. Prior CABG. Indwelling pacemaker. EXAM: CHEST - 2 VIEW COMPARISON:  03/19/2017 and earlier, including CT chest 05/03/2017 and earlier. FINDINGS: AP ERECT and LATERAL images were obtained, and the LATERAL image is blurred by respiratory motion. Prior sternotomy for CABG. Cardiac silhouette moderately enlarged, unchanged. LEFT subclavian dual lead transvenous pacemaker unchanged and appears intact. Thoracic aorta atherosclerotic, unchanged. Hilar and mediastinal contours otherwise unremarkable. 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 Pulmonary vascularity normal without evidence of pulmonary edema currently. No confluent airspace consolidation. No pleural effusions. Mild degenerative changes involving the thoracic spine. IMPRESSION: 1. Stable cardiomegaly. No acute cardiopulmonary disease. 2. 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 Electronically Signed   By: Evangeline Dakin M.D.   On: 05/15/2019 16:53   Dg Toe 4th Left  Result Date: 05/15/2019 CLINICAL  DATA:  Pain of the fourth toe EXAM: LEFT FOURTH TOE COMPARISON:  07/30/2018 FINDINGS: There is an old osteotomy of the fifth metatarsal. No bone or joint pathology is seen affecting the fourth toe. Distal soft tissues appear somewhat irregular, possibly with some soft tissue gas. IMPRESSION: No bone or joint abnormality of the fourth toe. Question soft tissue abnormality distally, possibly with some soft tissue gas. Electronically Signed   By: Nelson Chimes M.D.   On: 05/15/2019 18:23    Patient Profile     78 year old male with past medical history of coronary artery disease status post coronary artery bypass graft as well as maze procedure, pacemaker, previous discontinuation of multaq for possible contribution to pulmonary fibrosis, testicular cancer, cirrhosis, prior spontaneous subdural hematoma not felt to be a candidate for anticoagulation for evaluation of right-sided congestive heart failure.    Assessment & Plan    1 acute on chronic right heart failure-I/O-2460; weight 188. Continue Lasix 80 mg IV twice daily and spironolactone 25 mg daily.  Follow renal function closely. Await FU echocardiogram.  Right heart failure likely multifactorial including pulmonary venous hypertension, sleep apnea, and pulmonary fibrosis.  2 PAF-in sinus on device interrogation. Continue metoprolol at present dose. Patient is not a candidate for anticoagulation given history of spontaneous subdural hematoma.    3 cirrhosis-patient is noted to have cirrhosis and splenomegaly on previous CT scan.  His liver is palpable on examination.  Ascites on examination.  Etiology unclear but could be secondary to right heart failure.  Would ask gastroenterology to further assess.  4 Ischemic fourth digit left lower extremity-continue antibiotics.  Agree with ABIs and vascular surgery evaluation.   5 prior pacemaker-device interrogated and ERI. Arrange fu with EP 2-4 weeks following DC (felt to have 3 months battery life  left).  6 anemia/thrombocytopenia-chronic.  This is felt to be secondary to cirrhosis/splenomegaly and anemia of chronic disease/renal insufficiency.  He is followed by hematology as an outpatient.  For questions or updates, please contact [place] Please consult [email or web address] for contact info under        Signed, Kirk Ruths, MD  05/16/2019, 9:03 AM

## 2019-05-16 NOTE — Progress Notes (Signed)
  Echocardiogram 2D Echocardiogram was attempted a second time but Physical Therapy was with the patient.   Jennette Dubin 05/16/2019, 1:37 PM

## 2019-05-16 NOTE — Addendum Note (Signed)
Addended by: Ulice Brilliant T on: 05/16/2019 08:00 AM   Modules accepted: Orders

## 2019-05-16 NOTE — Progress Notes (Signed)
[name]  PROGRESS NOTE    [name].  BWG:[id number] DOB: 1941/10/07 DOA: 05/15/2019 PCP: [name], MD   Brief Narrative:   [name]. is a 78 y.o. male with history of CAD status post CABG status post maze procedure, pacemaker placement for bradycardia A. fib, chronic anemia and thrombocytopenia and possible cirrhosis presented to the cardiology office for follow-up while increasing shortness of breath which has been ongoing for last few months.  Patient states his shortness of breath further worsened acutely last few days.  Has been a increasing lower extremity edema extending up to the thighs.  Has noticed increasing erythema and discoloration of the left fourth toe last few days.  Has been having frequent falls.  Denies hitting his head or losing consciousness.  Denies chest pain productive cough fever chills abdominal pain nausea or vomiting.  Patient has had a previous history of spontaneous subdural hematoma and is not on any anticoagulation.   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       - cards onboard, appreciate assistnace      - Lasix 80 mg IV BID, spironolactone 25mg  qday     - repeat echo pending  Lower extremity cellulitis with possible ischemic fourth toe of the left foot.     - ABI pending      - cefepime, vanc     - vascular surgery consult?  Cirrhosis of the liver     - [place] ab ordered     - LFTs ok  Chronic anemia and thrombocytopenia likely from cirrhosis of the liver     - Follow CBC.  History of Lewy body dementia per the chart.     - monitor  A. fib     - metoprolol rate control.       - Not on anticoagulation secondary history of spontaneous subdural hematoma in 2013.  Sleep apnea      - on CPAP at night  Deconditioning     - PT eval; likely related to HF   DVT prophylaxis: SCDs Code Status: FULL   Disposition Plan: TBD   Consultants:   Cardiology  Antimicrobials:  . Vanc, cefepime    Subjective: "Doc... man, am I peeing!!!"  Objective: Vitals:   05/16/19 0117 05/16/19 0640 05/16/19 0649 05/16/19 0824  BP: (!) 113/93 (!) 114/53  105/72  Pulse: 92 (!) 54  93  Resp: 20 20  20   Temp: 98.2 F (36.8 C) 98 F (36.7 C)  98.2 F (36.8 C)  TempSrc: Oral Oral  Oral  SpO2: 99% 98%  99%  Weight:   85.2 kg   Height:        Intake/Output Summary (Last 24 hours) at 05/16/2019 1002 Last data filed at 05/16/2019 0650 Gross per 24 hour  Intake 390 ml  Output 2850 ml  Net -2460 ml   Filed Weights   05/15/19 1534 05/16/19 0649  Weight: 91.6 kg 85.2 kg    Examination:  General exam: 78 y.o. male Appears calm and comfortable  Respiratory system: Clear to auscultation. Respiratory effort normal. Cardiovascular system: S1 & S2 heard, RRR. No JVD, murmurs, rubs, gallops or clicks. BLE edema Gastrointestinal system: Abdomen is distended, soft and nontender. No organomegaly or masses felt. Normal bowel sounds heard. Central nervous system: Alert and oriented. No focal neurological deficits. Extremities: Symmetric 5 x 5 power.   Data Reviewed: I have personally reviewed following labs and imaging studies.  CBC: Recent Labs  Lab 05/15/19 1535 05/16/19 0609  WBC 4.1 4.5  NEUTROABS  --  3.3  HGB 9.5* 9.5*  HCT 29.9* 28.9*  MCV 90.9 88.9  PLT 93* 268*   Basic Metabolic Panel: Recent Labs  Lab 05/15/19 1535 05/16/19 0609  NA 140 141  K 3.8 3.4*  CL 107 102  CO2 23 27  GLUCOSE 102* 92  BUN 20 17  CREATININE 1.37* 1.29*  CALCIUM 8.9 9.1  MG  --  1.6*   GFR: Estimated Creatinine Clearance: 52.2  mL/min (A) (by C-G formula based on SCr of 1.29 mg/dL (H)). Liver Function Tests: Recent Labs  Lab 05/15/19 1746 05/16/19 0609  AST 28 24  ALT 16 16  ALKPHOS 81 80  BILITOT 1.0 1.4*  PROT 7.7 7.0  ALBUMIN 3.5 3.4*   No results for input(s): LIPASE, AMYLASE in the last 168 hours. No results for input(s): AMMONIA in the last 168 hours. Coagulation Profile: No results for input(s): INR, PROTIME in the last 168 hours. Cardiac Enzymes: Recent Labs  Lab 05/15/19 1535  TROPONINI <0.03   BNP (last 3 results) No results for input(s): PROBNP in the last 8760 hours. HbA1C: No results for input(s): HGBA1C in the last 72 hours. CBG: No results for input(s): GLUCAP in the last 168 hours. Lipid Profile: No results for input(s): CHOL, HDL, LDLCALC, TRIG, CHOLHDL, LDLDIRECT in the last 72 hours. Thyroid Function Tests: Recent Labs    05/16/19 0609  TSH 2.022   Anemia Panel: No results for input(s): VITAMINB12, FOLATE, FERRITIN, TIBC, IRON, RETICCTPCT in the last 72 hours. Sepsis Labs: Recent Labs  Lab 05/15/19 1803  LATICACIDVEN 0.6    Recent Results (from the past 240 hour(s))  SARS Coronavirus 2 (CEPHEID - Performed in [place] hospital lab), Hosp Order     Status: None   Collection Time: 05/15/19  6:22 PM  Result Value Ref Range Status   SARS Coronavirus 2 NEGATIVE NEGATIVE Final    Comment: (NOTE) If result is NEGATIVE SARS-CoV-2 target nucleic acids are NOT DETECTED. The SARS-CoV-2 RNA is generally detectable in upper and lower  respiratory specimens during the acute phase of infection. The lowest  concentration of SARS-CoV-2 viral copies this assay can detect is 250  copies / mL. A negative result does not preclude SARS-CoV-2 infection  and should not be used as the sole basis for treatment or other  patient management decisions.  A negative result may occur with  improper specimen collection / handling, submission of specimen other  than nasopharyngeal swab,  presence of viral mutation(s) within the  areas targeted by this assay, and inadequate number of viral copies  (<250 copies / mL). A negative result must be combined with clinical  observations, patient history, and epidemiological information. If result is POSITIVE SARS-CoV-2 target nucleic acids are DETECTED. The SARS-CoV-2 RNA is generally detectable in upper and lower  respiratory specimens dur ing the acute phase of infection.  Positive  results are indicative of active infection with SARS-CoV-2.  Clinical  correlation with patient history and other diagnostic information  is  necessary to determine patient infection status.  Positive results do  not rule out bacterial infection or co-infection with other viruses. If result is PRESUMPTIVE POSTIVE SARS-CoV-2 nucleic acids MAY BE PRESENT.   A presumptive positive result was obtained on the submitted specimen  and confirmed on repeat testing.  While 2019 novel coronavirus  (SARS-CoV-2) nucleic acids may be present in the submitted sample  additional confirmatory testing may be necessary for epidemiological  and / or clinical management purposes  to differentiate between  SARS-CoV-2 and other Sarbecovirus currently known to infect humans.  If clinically indicated additional testing with an alternate test  methodology [phone]) is advised. The SARS-CoV-2 RNA is generally  detectable in upper and lower respiratory sp ecimens during the acute  phase of infection. The expected result is Negative. Fact Sheet for Patients:  StrictlyIdeas.no Fact Sheet for Healthcare Providers: BankingDealers.co.za This test is not yet approved or cleared by the [place] FDA and has been authorized for detection and/or diagnosis of SARS-CoV-2 by FDA under an Emergency Use Authorization (EUA).  This EUA will remain in effect (meaning this test can be used) for the duration of the COVID-19 declaration under  Section 564(b)(1) of the Act, 21 U.S.C. section 360bbb-3(b)(1), unless the authorization is terminated or revoked sooner. Performed at [facility], [place] [address] [place], [place] [address]          Radiology Studies: Dg Chest 2 View  Result Date: 05/15/2019 CLINICAL DATA:  One-week history of shortness of breath that worsens with exertion. Former smoker. Prior CABG. Indwelling pacemaker. EXAM: CHEST - 2 VIEW COMPARISON:  03/19/2017 and earlier, including CT chest 05/03/2017 and earlier. FINDINGS: AP ERECT and LATERAL images were obtained, and the LATERAL image is blurred by respiratory motion. Prior sternotomy for CABG. Cardiac silhouette moderately enlarged, unchanged. LEFT subclavian dual lead transvenous pacemaker unchanged and appears intact. Thoracic aorta atherosclerotic, unchanged. Hilar and mediastinal contours otherwise unremarkable. 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 Pulmonary vascularity normal without evidence of pulmonary edema currently. No confluent airspace consolidation. No pleural effusions. Mild degenerative changes involving the thoracic spine. IMPRESSION: 1. Stable cardiomegaly. No acute cardiopulmonary disease. 2. 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 Electronically Signed   By: Evangeline Dakin M.D.   On: 05/15/2019 16:53   Dg Toe 4th Left  Result Date: 05/15/2019 CLINICAL DATA:  Pain of the fourth toe EXAM: LEFT FOURTH TOE COMPARISON:  07/30/2018 FINDINGS: There is an old osteotomy of the fifth metatarsal. No bone or joint pathology is seen affecting the fourth toe. Distal soft tissues appear somewhat irregular, possibly with some soft tissue gas. IMPRESSION: No bone or joint abnormality of the fourth toe. Question soft tissue abnormality distally, possibly with some soft tissue gas. Electronically Signed   By:  Nelson Chimes M.D.   On: 05/15/2019 18:23        Scheduled Meds: . allopurinol  300 mg Oral Daily  . ferrous sulfate  325 mg Oral Q breakfast  . furosemide  80 mg Intravenous BID  . metoprolol tartrate  75 mg Oral BID  . mirabegron ER  50 mg Oral Daily  . pantoprazole  40 mg Oral Daily  . potassium chloride SA  20 mEq Oral Daily  . rosuvastatin  40 mg Oral Daily  . sertraline  200 mg Oral Daily  . sodium chloride flush  3 mL Intravenous Once  .  spironolactone  25 mg Oral Daily  . tamsulosin  0.4 mg Oral QHS   Continuous Infusions: . ceFEPime (MAXIPIME) IV 2 g (05/16/19 0954)  . ceFEPime (MAXIPIME) IV    . vancomycin       LOS: 1 day    Time spent: 25 minutes spent in the coordination of care today.    Jonnie Finner, DO [facility] Pager [phone]  If 7PM-7AM, please contact night-coverage [email or web address] Password [email or web address] 05/16/2019, 10:02 AM

## 2019-05-16 NOTE — Progress Notes (Signed)
Pharmacy Antibiotic Note  [name]. is a 78 y.o. male admitted on 05/15/2019 with wound infection.  Pharmacy has been consulted add cefepime to ABX regimen.  Plan: Cefepime 2g IV Q12H.  Height: 5' 8.5" (174 cm) Weight: 201 lb 15.1 oz (91.6 kg) IBW/kg (Calculated) : 69.55  Temp (24hrs), Avg:98 F (36.7 C), Min:97.4 F (36.3 C), Max:98.5 F (36.9 C)  Recent Labs  Lab 05/15/19 1535 05/15/19 1803  WBC 4.1  --   CREATININE 1.37*  --   LATICACIDVEN  --  0.6    Estimated Creatinine Clearance: 50.9 mL/min (A) (by C-G formula based on SCr of 1.37 mg/dL (H)).    Allergies  Allergen Reactions  . Lipitor [Atorvastatin] Other (See Comments)    Stiff joints  . Nsaids Other (See Comments)    unknown  . Warfarin And Related Other (See Comments)    Stiff joints  . Allopurinol Itching    May or may not be allergic to this (If the patient should find out he is NOT allergic, please omit this entry.)  . Enbrel [Etanercept] Itching    May or may not be allergic to this (If the patient should find out he is NOT allergic, please omit this entry.)     Thank you for allowing pharmacy to be a part of this patient's care.  Wynona Neat, PharmD, BCPS  05/16/2019 6:07 AM

## 2019-05-17 ENCOUNTER — Encounter (HOSPITAL_COMMUNITY)
Admission: EM | Disposition: A | Discharge status: Rehabilitation Facility | Payer: Self-pay | Source: Home / Self Care | Attending: Family Medicine

## 2019-05-17 ENCOUNTER — Inpatient Hospital Stay (HOSPITAL_COMMUNITY): Payer: Medicare Other | Admitting: Certified Registered Nurse Anesthetist

## 2019-05-17 ENCOUNTER — Inpatient Hospital Stay (HOSPITAL_COMMUNITY): Payer: Medicare Other

## 2019-05-17 ENCOUNTER — Encounter (HOSPITAL_COMMUNITY): Payer: Self-pay

## 2019-05-17 DIAGNOSIS — L089 Local infection of the skin and subcutaneous tissue, unspecified: Secondary | ICD-10-CM

## 2019-05-17 DIAGNOSIS — I361 Nonrheumatic tricuspid (valve) insufficiency: Secondary | ICD-10-CM

## 2019-05-17 HISTORY — PX: AMPUTATION: SHX166

## 2019-05-17 LAB — CBC WITH DIFFERENTIAL/PLATELET
Abs Immature Granulocytes: 0.02 10*3/uL (ref 0.00–0.07)
Basophils Absolute: 0 10*3/uL (ref 0.0–0.1)
Basophils Relative: 1 %
Eosinophils Absolute: 0.2 10*3/uL (ref 0.0–0.5)
Eosinophils Relative: 3 %
HCT: 30.7 % — ABNORMAL LOW (ref 39.0–52.0)
Hemoglobin: 9.9 g/dL — ABNORMAL LOW (ref 13.0–17.0)
Immature Granulocytes: 0 %
Lymphocytes Relative: 19 %
Lymphs Abs: 1 10*3/uL (ref 0.7–4.0)
MCH: 28.4 pg (ref 26.0–34.0)
MCHC: 32.2 g/dL (ref 30.0–36.0)
MCV: 88 fL (ref 80.0–100.0)
Monocytes Absolute: 0.3 10*3/uL (ref 0.1–1.0)
Monocytes Relative: 6 %
Neutro Abs: 3.7 10*3/uL (ref 1.7–7.7)
Neutrophils Relative %: 71 %
Platelets: 109 10*3/uL — ABNORMAL LOW (ref 150–400)
RBC: 3.49 MIL/uL — ABNORMAL LOW (ref 4.22–5.81)
RDW: 15.9 % — ABNORMAL HIGH (ref 11.5–15.5)
WBC: 5.2 10*3/uL (ref 4.0–10.5)
nRBC: 0 % (ref 0.0–0.2)

## 2019-05-17 LAB — RENAL FUNCTION PANEL
Albumin: 3.3 g/dL — ABNORMAL LOW (ref 3.5–5.0)
Anion gap: 13 (ref 5–15)
BUN: 20 mg/dL (ref 8–23)
CO2: 28 mmol/L (ref 22–32)
Calcium: 9.4 mg/dL (ref 8.9–10.3)
Chloride: 98 mmol/L (ref 98–111)
Creatinine, Ser: 1.44 mg/dL — ABNORMAL HIGH (ref 0.61–1.24)
GFR calc Af Amer: 54 mL/min — ABNORMAL LOW (ref >60)
GFR calc non Af Amer: 47 mL/min — ABNORMAL LOW (ref >60)
Glucose, Bld: 94 mg/dL (ref 70–99)
Phosphorus: 3.3 mg/dL (ref 2.5–4.6)
Potassium: 3.3 mmol/L — ABNORMAL LOW (ref 3.5–5.1)
Sodium: 139 mmol/L (ref 135–145)

## 2019-05-17 LAB — GLUCOSE, CAPILLARY
Glucose-Capillary: 102 mg/dL — ABNORMAL HIGH (ref 70–99)
Glucose-Capillary: 114 mg/dL — ABNORMAL HIGH (ref 70–99)

## 2019-05-17 LAB — MAGNESIUM: Magnesium: 1.7 mg/dL (ref 1.7–2.4)

## 2019-05-17 LAB — ECHOCARDIOGRAM COMPLETE
Height: 68.5 in
Weight: 2896 oz

## 2019-05-17 SURGERY — AMPUTATION DIGIT
Anesthesia: General | Site: Toe | Laterality: Left

## 2019-05-17 MED ORDER — FENTANYL CITRATE (PF) 100 MCG/2ML IJ SOLN: 25.0000 ug | INTRAMUSCULAR | Status: DC | PRN

## 2019-05-17 MED ORDER — SUCCINYLCHOLINE CHLORIDE 20 MG/ML IJ SOLN
INTRAMUSCULAR | Status: DC | PRN
  Administered 2019-05-17: 100 mg via INTRAVENOUS

## 2019-05-17 MED ORDER — FENTANYL CITRATE (PF) 250 MCG/5ML IJ SOLN
INTRAMUSCULAR | Status: AC
  Filled 2019-05-17: qty 5

## 2019-05-17 MED ORDER — ONDANSETRON HCL 4 MG PO TABS: 4.0000 mg | ORAL_TABLET | Freq: Four times a day (QID) | ORAL | Status: DC | PRN

## 2019-05-17 MED ORDER — BUPIVACAINE HCL (PF) 0.25 % IJ SOLN
INTRAMUSCULAR | Status: AC
  Filled 2019-05-17: qty 30

## 2019-05-17 MED ORDER — PROPOFOL 10 MG/ML IV BOLUS
INTRAVENOUS | Status: AC
  Filled 2019-05-17: qty 20

## 2019-05-17 MED ORDER — ONDANSETRON HCL 4 MG/2ML IJ SOLN: 4.0000 mg | Freq: Once | INTRAMUSCULAR | Status: DC | PRN

## 2019-05-17 MED ORDER — PROPOFOL 10 MG/ML IV BOLUS
INTRAVENOUS | Status: DC | PRN
  Administered 2019-05-17: 100 mg via INTRAVENOUS

## 2019-05-17 MED ORDER — PERFLUTREN LIPID MICROSPHERE
1.0000 mL | INTRAVENOUS | Status: DC | PRN
  Administered 2019-05-17: 3 mL via INTRAVENOUS
  Filled 2019-05-17: qty 10

## 2019-05-17 MED ORDER — EPHEDRINE SULFATE 50 MG/ML IJ SOLN
INTRAMUSCULAR | Status: DC | PRN
  Administered 2019-05-17: 10 mg via INTRAVENOUS

## 2019-05-17 MED ORDER — MEPERIDINE HCL 25 MG/ML IJ SOLN: 6.2500 mg | INTRAMUSCULAR | Status: DC | PRN

## 2019-05-17 MED ORDER — METOCLOPRAMIDE HCL 5 MG PO TABS: 5.0000 mg | ORAL_TABLET | Freq: Three times a day (TID) | ORAL | Status: DC | PRN

## 2019-05-17 MED ORDER — DOCUSATE SODIUM 100 MG PO CAPS
100.0000 mg | ORAL_CAPSULE | Freq: Two times a day (BID) | ORAL | Status: DC
  Administered 2019-05-19 – 2019-05-26 (×10): 100 mg via ORAL
  Filled 2019-05-17 (×19): qty 1

## 2019-05-17 MED ORDER — HYDROCODONE-ACETAMINOPHEN 5-325 MG PO TABS
1.0000 | ORAL_TABLET | ORAL | Status: DC | PRN
  Administered 2019-05-18: 1 via ORAL
  Administered 2019-05-19: 2 via ORAL
  Filled 2019-05-17: qty 2
  Filled 2019-05-17: qty 1

## 2019-05-17 MED ORDER — METOCLOPRAMIDE HCL 5 MG/ML IJ SOLN: 5.0000 mg | Freq: Three times a day (TID) | INTRAMUSCULAR | Status: DC | PRN

## 2019-05-17 MED ORDER — 0.9 % SODIUM CHLORIDE (POUR BTL) OPTIME
TOPICAL | Status: DC | PRN
  Administered 2019-05-17: 1000 mL

## 2019-05-17 MED ORDER — ONDANSETRON HCL 4 MG/2ML IJ SOLN: 4.0000 mg | Freq: Four times a day (QID) | INTRAMUSCULAR | Status: DC | PRN

## 2019-05-17 MED ORDER — FENTANYL CITRATE (PF) 250 MCG/5ML IJ SOLN
INTRAMUSCULAR | Status: DC | PRN
  Administered 2019-05-17: 50 ug via INTRAVENOUS
  Administered 2019-05-17 (×4): 25 ug via INTRAVENOUS

## 2019-05-17 MED ORDER — GABAPENTIN 300 MG PO CAPS
300.0000 mg | ORAL_CAPSULE | Freq: Three times a day (TID) | ORAL | Status: DC
  Administered 2019-05-18 – 2019-05-26 (×24): 300 mg via ORAL
  Filled 2019-05-17 (×27): qty 1

## 2019-05-17 MED ORDER — BUPIVACAINE HCL (PF) 0.5 % IJ SOLN
INTRAMUSCULAR | Status: DC | PRN
  Administered 2019-05-17: 10 mL

## 2019-05-17 MED ORDER — ONDANSETRON HCL 4 MG/2ML IJ SOLN
INTRAMUSCULAR | Status: DC | PRN
  Administered 2019-05-17: 4 mg via INTRAVENOUS

## 2019-05-17 MED ORDER — PHENYLEPHRINE HCL (PRESSORS) 10 MG/ML IV SOLN
INTRAVENOUS | Status: DC | PRN
  Administered 2019-05-17: 80 ug via INTRAVENOUS

## 2019-05-17 MED ORDER — BUPIVACAINE HCL (PF) 0.5 % IJ SOLN
INTRAMUSCULAR | Status: AC
  Filled 2019-05-17: qty 30

## 2019-05-17 MED ORDER — LIDOCAINE 2% (20 MG/ML) 5 ML SYRINGE
INTRAMUSCULAR | Status: DC | PRN
  Administered 2019-05-17: 100 mg via INTRAVENOUS

## 2019-05-17 MED ORDER — DEXAMETHASONE SODIUM PHOSPHATE 10 MG/ML IJ SOLN
INTRAMUSCULAR | Status: DC | PRN
  Administered 2019-05-17: 10 mg via INTRAVENOUS

## 2019-05-17 MED ORDER — MIDAZOLAM HCL 2 MG/2ML IJ SOLN
INTRAMUSCULAR | Status: AC
  Filled 2019-05-17: qty 2

## 2019-05-17 MED ORDER — LACTATED RINGERS IV SOLN
INTRAVENOUS | Status: DC
  Administered 2019-05-17 – 2019-05-20 (×2): via INTRAVENOUS

## 2019-05-17 SURGICAL SUPPLY — 28 items
BANDAGE ACE 4X5 VEL STRL LF (GAUZE/BANDAGES/DRESSINGS) ×2 IMPLANT
COVER SURGICAL LIGHT HANDLE (MISCELLANEOUS) ×2 IMPLANT
DRAPE U-SHAPE 47X51 STRL (DRAPES) ×2 IMPLANT
ELECT REM PT RETURN 9FT ADLT (ELECTROSURGICAL) ×2
ELECTRODE REM PT RTRN 9FT ADLT (ELECTROSURGICAL) ×1 IMPLANT
GAUZE SPONGE 4X4 12PLY STRL (GAUZE/BANDAGES/DRESSINGS) ×2 IMPLANT
GAUZE XEROFORM 1X8 LF (GAUZE/BANDAGES/DRESSINGS) ×2 IMPLANT
GLOVE BIOGEL PI IND STRL 8 (GLOVE) ×1 IMPLANT
GLOVE BIOGEL PI INDICATOR 8 (GLOVE) ×1
GLOVE SURG ORTHO 8.0 STRL STRW (GLOVE) ×2 IMPLANT
GOWN STRL REUS W/ TWL LRG LVL3 (GOWN DISPOSABLE) ×2 IMPLANT
GOWN STRL REUS W/TWL LRG LVL3 (GOWN DISPOSABLE) ×2
KIT BASIN OR (CUSTOM PROCEDURE TRAY) ×2 IMPLANT
KIT TURNOVER KIT B (KITS) ×2 IMPLANT
MANIFOLD NEPTUNE II (INSTRUMENTS) ×2 IMPLANT
NEEDLE 22X1 1/2 (OR ONLY) (NEEDLE) ×2 IMPLANT
NS IRRIG 1000ML POUR BTL (IV SOLUTION) ×2 IMPLANT
PACK ORTHO EXTREMITY (CUSTOM PROCEDURE TRAY) ×2 IMPLANT
PAD ARMBOARD 7.5X6 YLW CONV (MISCELLANEOUS) ×4 IMPLANT
SPECIMEN JAR SMALL (MISCELLANEOUS) ×2 IMPLANT
SUCTION FRAZIER HANDLE 10FR (MISCELLANEOUS) ×1
SUCTION TUBE FRAZIER 10FR DISP (MISCELLANEOUS) ×1 IMPLANT
SUT ETHILON 3 0 PS 1 (SUTURE) ×2 IMPLANT
SUT VIC AB 3-0 PS2 18 (SUTURE) ×2 IMPLANT
SYR CONTROL 10ML LL (SYRINGE) ×2 IMPLANT
TOWEL OR 17X26 10 PK STRL BLUE (TOWEL DISPOSABLE) ×2 IMPLANT
TUBE CONNECTING 12X1/4 (SUCTIONS) ×2 IMPLANT
WATER STERILE IRR 1000ML POUR (IV SOLUTION) ×2 IMPLANT

## 2019-05-17 NOTE — Progress Notes (Signed)
Progress Note  Patient Name: [name]. Date of Encounter: 05/17/2019  Primary Cardiologist: Kirk Ruths, MD   Subjective   "My foot still hurts." dyspnea is improved.   Inpatient Medications    Scheduled Meds: . allopurinol  300 mg Oral Daily  . ferrous sulfate  325 mg Oral Q breakfast  . furosemide  80 mg Intravenous BID  . metoprolol tartrate  75 mg Oral BID  . mirabegron ER  50 mg Oral Daily  . pantoprazole  40 mg Oral Daily  . potassium chloride SA  20 mEq Oral Daily  . rosuvastatin  40 mg Oral Daily  . sertraline  200 mg Oral Daily  . sodium chloride flush  3 mL Intravenous Once  . spironolactone  25 mg Oral Daily  . tamsulosin  0.4 mg Oral QHS   Continuous Infusions: . ceFEPime (MAXIPIME) IV 2 g (05/17/19 0820)  . vancomycin Stopped (05/16/19 2330)   PRN Meds: acetaminophen **OR** acetaminophen, ondansetron **OR** ondansetron (ZOFRAN) IV   Vital Signs    Vitals:   05/16/19 1151 05/16/19 2027 05/17/19 0524 05/17/19 0536  BP: 114/74 113/75 120/82   Pulse: 92 92 99   Resp: 20 18 18    Temp: (!) 97.5 F (36.4 C) 97.7 F (36.5 C) 98.2 F (36.8 C)   TempSrc: Oral  Oral   SpO2: 96% 100% 99%   Weight:    82.1 kg  Height:        Intake/Output Summary (Last 24 hours) at 05/17/2019 0924 Last data filed at 05/17/2019 0535 Gross per 24 hour  Intake 1000 ml  Output 4600 ml  Net -3600 ml   Filed Weights   05/15/19 1534 05/16/19 0649 05/17/19 0536  Weight: 91.6 kg 85.2 kg 82.1 kg    Telemetry    Probable NSR with atrial pacing - Personally Reviewed  ECG    none - Personally Reviewed  Physical Exam   GEN: No acute distress.   Neck: No JVD Cardiac: RRR, no murmurs, rubs, or gallops.  Respiratory: Clear to auscultation bilaterally. GI: Soft, nontender, non-distended  MS: No edema; No deformity. Chronic venous stasis changes up to the knee Neuro:  Nonfocal  Psych: Normal affect   Labs    Chemistry Recent Labs  Lab 05/15/19 1535  05/15/19 1746 05/16/19 0609 05/17/19 0455  NA 140  --  141 139  K 3.8  --  3.4* 3.3*  CL 107  --  102 98  CO2 23  --  27 28  GLUCOSE 102*  --  92 94  BUN 20  --  17 20  CREATININE 1.37*  --  1.29* 1.44*  CALCIUM 8.9  --  9.1 9.4  PROT  --  7.7 7.0  --   ALBUMIN  --  3.5 3.4* 3.3*  AST  --  28 24  --   ALT  --  16 16  --   ALKPHOS  --  81 80  --   BILITOT  --  1.0 1.4*  --   GFRNONAA 50*  --  53* 47*  GFRAA 58*  --  >60 54*  ANIONGAP 10  --  12 13     Hematology Recent Labs  Lab 05/15/19 1535 05/16/19 0609 05/17/19 0455  WBC 4.1 4.5 5.2  RBC 3.29* 3.25* 3.49*  HGB 9.5* 9.5* 9.9*  HCT 29.9* 28.9* 30.7*  MCV 90.9 88.9 88.0  MCH 28.9 29.2 28.4  MCHC 31.8 32.9 32.2  RDW 16.2* 16.1*  15.9*  PLT 93* 100* 109*    Cardiac Enzymes Recent Labs  Lab 05/15/19 1535  TROPONINI <0.03   No results for input(s): TROPIPOC in the last 168 hours.   BNP Recent Labs  Lab 05/16/19 0609  BNP 289.1*     DDimer No results for input(s): DDIMER in the last 168 hours.   Radiology    Dg Chest 2 View  Result Date: 05/15/2019 CLINICAL DATA:  One-week history of shortness of breath that worsens with exertion. Former smoker. Prior CABG. Indwelling pacemaker. EXAM: CHEST - 2 VIEW COMPARISON:  03/19/2017 and earlier, including CT chest 05/03/2017 and earlier. FINDINGS: AP ERECT and LATERAL images were obtained, and the LATERAL image is blurred by respiratory motion. Prior sternotomy for CABG. Cardiac silhouette moderately enlarged, unchanged. LEFT subclavian dual lead transvenous pacemaker unchanged and appears intact. Thoracic aorta atherosclerotic, unchanged. Hilar and mediastinal contours otherwise unremarkable. 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 Pulmonary vascularity normal without evidence of pulmonary edema currently. No confluent airspace consolidation. No pleural effusions. Mild degenerative changes involving  the thoracic spine. IMPRESSION: 1. Stable cardiomegaly. No acute cardiopulmonary disease. 2. 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 Electronically Signed   By: Evangeline Dakin M.D.   On: 05/15/2019 16:53   Dg Toe 4th Left  Result Date: 05/15/2019 CLINICAL DATA:  Pain of the fourth toe EXAM: LEFT FOURTH TOE COMPARISON:  07/30/2018 FINDINGS: There is an old osteotomy of the fifth metatarsal. No bone or joint pathology is seen affecting the fourth toe. Distal soft tissues appear somewhat irregular, possibly with some soft tissue gas. IMPRESSION: No bone or joint abnormality of the fourth toe. Question soft tissue abnormality distally, possibly with some soft tissue gas. Electronically Signed   By: Nelson Chimes M.D.   On: 05/15/2019 18:23   [place] Ascites (abdomen Limited)  Result Date: 05/16/2019 CLINICAL DATA:  Ascites. EXAM: LIMITED ABDOMEN ULTRASOUND FOR ASCITES TECHNIQUE: Limited ultrasound survey for ascites was performed in all four abdominal quadrants. COMPARISON:  None. FINDINGS: All 4 quadrants of the abdomen were imaged without significant free fluid identified. IMPRESSION: No evidence of ascites. Electronically Signed   By: Logan Bores M.D.   On: 05/16/2019 11:33    Cardiac Studies   2D echo is pending  Patient Profile     78 y.o. male admitted with predominantly right greater than left sided heart failure.  Assessment & Plan    1. Right heart failure - he has been diuresed and lost over 20 lbs. He appears to be euvolemic. He is encouraged to wear support stockings and maintain a low sodium diet. I would discharge on lasix 80 mg bid.  2. PPM - he has reached ERI. I recommended we have him return for PPM gen change in the next 4-5 weeks.  [facility] will sign off.   Medication Recommendations:  See above Other recommendations (labs, testing, etc):  followup BMP in 10 days Follow up as an outpatient:  Dr.  Caralee Ates in 2-3 weeks  For questions or updates, please contact [place] Please consult [email or web address] for contact info under Cardiology/STEMI.   Signed, Cristopher Peru, MD  05/17/2019, 9:24 AM  Patient ID: Jamie Singleton., male   DOB: Dec 05, 1941, 78 y.o.   MRN: [id number]

## 2019-05-17 NOTE — Progress Notes (Signed)
RT Note:  Patient self manages home CPAP unit.  Put water in for patient.

## 2019-05-17 NOTE — Progress Notes (Signed)
  Echocardiogram 2D Echocardiogram with Definity has been performed.  [name] 05/17/2019, 11:32 AM

## 2019-05-17 NOTE — Anesthesia Postprocedure Evaluation (Signed)
Anesthesia Post Note  Patient: [name].  Procedure(s) Performed: AMPUTATION LEFT FOURTH TOE (Left Toe)     Patient location during evaluation: PACU Anesthesia Type: General Level of consciousness: sedated and patient cooperative Pain management: pain level controlled Vital Signs Assessment: post-procedure vital signs reviewed and stable Respiratory status: spontaneous breathing Cardiovascular status: stable Anesthetic complications: no    Last Vitals:  Vitals:   05/17/19 1858 05/17/19 1944  BP: 116/75 117/74  Pulse: 93 94  Resp:  18  Temp: 36.4 C 36.7 C  SpO2: 99% 98%    Last Pain:  Vitals:   05/17/19 1947  TempSrc:   PainSc: [place]

## 2019-05-17 NOTE — Brief Op Note (Signed)
   05/17/2019  5:27 PM  PATIENT:  [name]  78 y.o. male  PRE-OPERATIVE DIAGNOSIS:  left fourth toe infection  POST-OPERATIVE DIAGNOSIS: Left toe infection fourth toe  PROCEDURE:  Procedure(s): AMPUTATION LEFT FOURTH TOE  SURGEON:  Surgeon(s): Meredith Pel, MD  ASSISTANT: none  ANESTHESIA:   general  EBL: 2 ml    Total I/O In: 700 [I.V.:700] Out: 1005 [Urine:1000; Blood:5]  BLOOD ADMINISTERED: none  DRAINS: none   LOCAL MEDICATIONS USED: 10 cc plain Marcaine  SPECIMEN: Toe to path  COUNTS:  YES  TOURNIQUET:  * Missing tourniquet times found for documented tourniquets in log: [id number] *  DICTATION: .Other Dictation: Dictation Number [id number]  PLAN OF CARE: Admit to inpatient   PATIENT DISPOSITION:  PACU - hemodynamically stable

## 2019-05-17 NOTE — Anesthesia Procedure Notes (Signed)
Procedure Name: Intubation Date/Time: 05/17/2019 4:36 PM Performed by: Clearnce Sorrel, CRNA Pre-anesthesia Checklist: Patient identified, Emergency Drugs available, Suction available, Patient being monitored and Timeout performed Patient Re-evaluated:Patient Re-evaluated prior to induction Oxygen Delivery Method: Circle system utilized Preoxygenation: Pre-oxygenation with 100% oxygen Induction Type: IV induction and Rapid sequence Laryngoscope Size: Mac and 4 Grade View: Grade I Tube type: Oral Tube size: 7.5 mm Number of attempts: 1 Airway Equipment and Method: Stylet Placement Confirmation: ETT inserted through vocal cords under direct vision,  positive ETCO2 and breath sounds checked- equal and bilateral Secured at: 23 cm Tube secured with: Tape Dental Injury: Teeth and Oropharynx as per pre-operative assessment

## 2019-05-17 NOTE — Anesthesia Preprocedure Evaluation (Signed)
Anesthesia Evaluation  Patient identified by MRN, date of birth, ID band Patient awake    Reviewed: Allergy & Precautions, H&P , NPO status , Patient's Chart, lab work & pertinent test results  Airway Mallampati: I  TM Distance: >3 FB Neck ROM: Full    Dental  (+) Teeth Intact, Dental Advisory Given   Pulmonary sleep apnea , former smoker,    breath sounds clear to auscultation       Cardiovascular hypertension, Pt. on medications and Pt. on home beta blockers + CAD and + DOE  + dysrhythmias + pacemaker  Rhythm:Regular Rate:Normal     Neuro/Psych    GI/Hepatic   Endo/Other  diabetes, Type 2, Insulin DependentMorbid obesity  Renal/GU      Musculoskeletal   Abdominal   Peds  Hematology  (+) anemia ,   Anesthesia Other Findings   Reproductive/Obstetrics                             Anesthesia Physical  Anesthesia Plan  ASA: IV  Anesthesia Plan: General   Post-op Pain Management:    Induction: Intravenous  PONV Risk Score and Plan: 3 and Ondansetron, Dexamethasone and Treatment may vary due to age or medical condition  Airway Management Planned: LMA  Additional Equipment: None  Intra-op Plan:   Post-operative Plan: Extubation in OR  Informed Consent: I have reviewed the patients History and Physical, chart, labs and discussed the procedure including the risks, benefits and alternatives for the proposed anesthesia with the patient or authorized representative who has indicated his/her understanding and acceptance.     Dental advisory given  Plan Discussed with: CRNA  Anesthesia Plan Comments:        Anesthesia Quick Evaluation

## 2019-05-17 NOTE — Consult Note (Signed)
Reason for Consult: Left fourth toe infection Referring Physician: Dr. Galen Manila. is an 78 y.o. male.  HPI: Lobe is a patient who was admitted here for cardiac issues.  He was noted to have a left fourth toe infection.  This is been going on for about 2 months.  Patient's wife has been "digging at it" with some expression of purulent material at times.  Denies any history of trauma.  He is diabetic.  Radiographs obtained are negative for definite osteomyelitis.  Past Medical History:  Diagnosis Date  . (HFpEF) heart failure with preserved ejection fraction ([place])    a. 05/2013 Echo: EF 55%, mild LVH, diast dysfxn, Ao sclerosis, mildly dil LA, RV dysfxn (poorly visualized), PASP 29mmHg;  b. 03/2017 Echo: EF 55-60%, no rwma, triv MR, mildly dil RV, mod TR, PASP 64mmHg.  . Atrial fibrillation [facility])    s/p Cox Maze 1/09;  Multaq Rx d/c'd in 2014 due to pulmo fibrosis;  coumadin d/c'd in 2014 due to spontaneous subdural hematoma  . BPH (benign prostatic hyperplasia)   . CAD (coronary artery disease), native coronary artery    a. s/p CABG 12/2007;  b. Myoview 12/2011: EF 66%, no scar or ischemia; normal.  . DM (diabetes mellitus) ([place])   . Hyperlipidemia type II   . Hypertension   . 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 OSA (obstructive sleep apnea)   . Pacemaker    PPM - St. Jude  . Peripheral neuropathy 07/31/2018  . 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 Subdural hematoma ([place]) 07/2012   spontaneous;  coumadin d/c'd => no longer a candidate for anticoagulation    Past Surgical History:  Procedure Laterality Date  . APPENDECTOMY    . CHOLECYSTECTOMY    . CORONARY ARTERY BYPASS GRAFT     x3 (left internal mammary artery to distal left anterior descending coronary artery, saphenous vain graft to second circumflex marginal branch, saphenous vain graft to posterior descending coronary artery, endoscopic saphenous vain harvest from right  thigh) and modified Cox - Maze IV procedure.  [name],MD. Electronically signed CHO/MEDQ D: 01/09/2008/ JOB: [id number] cc:  [name]  . [name]  07/30/2012   Procedure: CRANIOTOMY HEMATOMA EVACUATION SUBDURAL;  Surgeon: [name], MD;  Location: [place] [facility];  Service: Neurosurgery;  Laterality: Right;  Right craniotomy for evacuation of subdural hematoma  . FOOT SURGERY    . HERNIA REPAIR    . ORCHIECTOMY     Left  /  testicular cancer  . PACEMAKER PLACEMENT     PPM - St. Jude    Family History  Problem Relation Age of Onset  . Heart disease Father   . Heart attack Father   . Heart failure Father   . Heart disease Mother   . Alzheimer's disease Mother   . Dementia Mother     Social History:  reports that he quit smoking about 28 years ago. He has never used smokeless tobacco. He reports that he does not drink alcohol or use drugs.  Allergies:  Allergies  Allergen Reactions  . Lipitor [Atorvastatin] Other (See Comments)    Stiff joints  . Nsaids Other (See Comments)    unknown  . Warfarin And Related Other (See Comments)    Stiff joints  . Allopurinol Itching    May or may not be allergic to this (If the patient should find out he is  NOT allergic, please omit this entry.)  . Enbrel [Etanercept] Itching    May or may not be allergic to this (If the patient should find out he is NOT allergic, please omit this entry.)    Medications: I have reviewed the patient's current medications.  Results for orders placed or performed during the hospital encounter of 05/15/19 (from the past 48 hour(s))  Basic metabolic panel     Status: Abnormal   Collection Time: 05/15/19  3:35 PM  Result Value Ref Range   Sodium 140 135 - 145 mmol/L   Potassium 3.8 3.5 - 5.1 mmol/L   Chloride 107 98 - 111 mmol/L   CO2 23 22 - 32 mmol/L   Glucose, Bld 102 (H) 70 - 99 mg/dL   BUN 20 8 - 23 mg/dL   Creatinine, Ser 1.37 (H) 0.61 - 1.24 mg/dL   Calcium 8.9 8.9 - 10.3 mg/dL   GFR  calc non Af Amer 50 (L) >60 mL/min   GFR calc Af Amer 58 (L) >60 mL/min   Anion gap 10 5 - 15    Comment: Performed at [facility], [place] [address], [place], [place] [address]  CBC     Status: Abnormal   Collection Time: 05/15/19  3:35 PM  Result Value Ref Range   WBC 4.1 4.0 - 10.5 K/uL   RBC 3.29 (L) 4.22 - 5.81 MIL/uL   Hemoglobin 9.5 (L) 13.0 - 17.0 g/dL   HCT 29.9 (L) 39.0 - 52.0 %   MCV 90.9 80.0 - 100.0 fL   MCH 28.9 26.0 - 34.0 pg   MCHC 31.8 30.0 - 36.0 g/dL   RDW 16.2 (H) 11.5 - 15.5 %   Platelets 93 (L) 150 - 400 K/uL    Comment: REPEATED TO VERIFY PLATELET COUNT CONFIRMED BY SMEAR SPECIMEN CHECKED FOR CLOTS Immature Platelet Fraction may be clinically indicated, consider ordering this additional test [id number]    nRBC 0.0 0.0 - 0.2 %    Comment: Performed at [facility], [place] [address], [place], [place] [address]  Troponin I - ONCE - STAT     Status: None   Collection Time: 05/15/19  3:35 PM  Result Value Ref Range   Troponin I <0.03 <0.03 ng/mL    Comment: Performed at [facility], [place] [address] [place], [place] [address]  Hepatic function panel     Status: None   Collection Time: 05/15/19  5:46 PM  Result Value Ref Range   Total Protein 7.7 6.5 - 8.1 g/dL   Albumin 3.5 3.5 - 5.0 g/dL   AST 28 15 - 41 U/L   ALT 16 0 - 44 U/L   Alkaline Phosphatase 81 38 - 126 U/L   Total Bilirubin 1.0 0.3 - 1.2 mg/dL   Bilirubin, Direct 0.2 0.0 - 0.2 mg/dL   Indirect Bilirubin 0.8 0.3 - 0.9 mg/dL    Comment: Performed at [facility] [address], [place], [place] [address]  Lactic acid, plasma     Status: None   Collection Time: 05/15/19  6:03 PM  Result Value Ref Range   Lactic Acid, Venous 0.6 0.5 - 1.9 mmol/L    Comment: Performed at [facility] [address], [place], [place] [address]  Culture, blood (routine x 2)     Status: None (Preliminary result)   Collection Time: 05/15/19  6:13 PM  Result Value Ref Range   Specimen  Description BLOOD RIGHT ANTECUBITAL  Special Requests      BOTTLES DRAWN AEROBIC AND ANAEROBIC Blood Culture adequate volume   Culture      NO GROWTH 2 DAYS Performed at [facility], [place] [address], [place], [place] [address]    Report Status PENDING   SARS Coronavirus 2 (CEPHEID - Performed in [facility]), Hosp Order     Status: None   Collection Time: 05/15/19  6:22 PM  Result Value Ref Range   SARS Coronavirus 2 NEGATIVE NEGATIVE    Comment: (NOTE) If result is NEGATIVE SARS-CoV-2 target nucleic acids are NOT DETECTED. The SARS-CoV-2 RNA is generally detectable in upper and lower  respiratory specimens during the acute phase of infection. The lowest  concentration of SARS-CoV-2 viral copies this assay can detect is 250  copies / mL. A negative result does not preclude SARS-CoV-2 infection  and should not be used as the sole basis for treatment or other  patient management decisions.  A negative result may occur with  improper specimen collection / handling, submission of specimen other  than nasopharyngeal swab, presence of viral mutation(s) within the  areas targeted by this assay, and inadequate number of viral copies  (<250 copies / mL). A negative result must be combined with clinical  observations, patient history, and epidemiological information. If result is POSITIVE SARS-CoV-2 target nucleic acids are DETECTED. The SARS-CoV-2 RNA is generally detectable in upper and lower  respiratory specimens dur ing the acute phase of infection.  Positive  results are indicative of active infection with SARS-CoV-2.  Clinical  correlation with patient history and other diagnostic information is  necessary to determine patient infection status.  Positive results do  not rule out bacterial infection or co-infection with other viruses. If result is PRESUMPTIVE POSTIVE SARS-CoV-2 nucleic acids MAY BE PRESENT.   A presumptive positive result was obtained on the  submitted specimen  and confirmed on repeat testing.  While 2019 novel coronavirus  (SARS-CoV-2) nucleic acids may be present in the submitted sample  additional confirmatory testing may be necessary for epidemiological  and / or clinical management purposes  to differentiate between  SARS-CoV-2 and other Sarbecovirus currently known to infect humans.  If clinically indicated additional testing with an alternate test  methodology [phone]) is advised. The SARS-CoV-2 RNA is generally  detectable in upper and lower respiratory sp ecimens during the acute  phase of infection. The expected result is Negative. Fact Sheet for Patients:  StrictlyIdeas.no Fact Sheet for Healthcare Providers: BankingDealers.co.za This test is not yet approved or cleared by the [place] FDA and has been authorized for detection and/or diagnosis of SARS-CoV-2 by FDA under an Emergency Use Authorization (EUA).  This EUA will remain in effect (meaning this test can be used) for the duration of the COVID-19 declaration under Section 564(b)(1) of the Act, 21 U.S.C. section 360bbb-3(b)(1), unless the authorization is terminated or revoked sooner. Performed at [facility], [place] [address] [place], [place] [address]   Culture, blood (routine x 2)     Status: None (Preliminary result)   Collection Time: 05/15/19  7:13 PM  Result Value Ref Range   Specimen Description BLOOD LEFT WRIST    Special Requests      BOTTLES DRAWN AEROBIC AND ANAEROBIC Blood Culture results may not be optimal due to an excessive volume of blood received in culture bottles   Culture      NO GROWTH 2 DAYS Performed at [facility], [place]  [address], [place], [place] [address]    Report Status PENDING   Basic metabolic panel     Status: Abnormal   Collection Time: 05/16/19  6:09 AM  Result Value Ref Range   Sodium 141 135 - 145 mmol/L   Potassium 3.4 (L) 3.5 - 5.1 mmol/L    Chloride 102 98 - 111 mmol/L   CO2 27 22 - 32 mmol/L   Glucose, Bld 92 70 - 99 mg/dL   BUN 17 8 - 23 mg/dL   Creatinine, Ser 1.29 (H) 0.61 - 1.24 mg/dL   Calcium 9.1 8.9 - 10.3 mg/dL   GFR calc non Af Amer 53 (L) >60 mL/min   GFR calc Af Amer >60 >60 mL/min   Anion gap 12 5 - 15    Comment: Performed at [facility], [place] [address] [place], [place] [address]  CBC WITH DIFFERENTIAL     Status: Abnormal   Collection Time: 05/16/19  6:09 AM  Result Value Ref Range   WBC 4.5 4.0 - 10.5 K/uL   RBC 3.25 (L) 4.22 - 5.81 MIL/uL   Hemoglobin 9.5 (L) 13.0 - 17.0 g/dL   HCT 28.9 (L) 39.0 - 52.0 %   MCV 88.9 80.0 - 100.0 fL   MCH 29.2 26.0 - 34.0 pg   MCHC 32.9 30.0 - 36.0 g/dL   RDW 16.1 (H) 11.5 - 15.5 %   Platelets 100 (L) 150 - 400 K/uL    Comment: REPEATED TO VERIFY Immature Platelet Fraction may be clinically indicated, consider ordering this additional test [id number] CONSISTENT WITH PREVIOUS RESULT    nRBC 0.0 0.0 - 0.2 %   Neutrophils Relative % 72 %   Neutro Abs 3.3 1.7 - 7.7 K/uL   Lymphocytes Relative 17 %   Lymphs Abs 0.8 0.7 - 4.0 K/uL   Monocytes Relative 6 %   Monocytes Absolute 0.3 0.1 - 1.0 K/uL   Eosinophils Relative 4 %   Eosinophils Absolute 0.2 0.0 - 0.5 K/uL   Basophils Relative 1 %   Basophils Absolute 0.0 0.0 - 0.1 K/uL   Immature Granulocytes 0 %   Abs Immature Granulocytes 0.02 0.00 - 0.07 K/uL    Comment: Performed at [facility], [place] [address], [place], [place] [address]  Hepatic function panel     Status: Abnormal   Collection Time: 05/16/19  6:09 AM  Result Value Ref Range   Total Protein 7.0 6.5 - 8.1 g/dL   Albumin 3.4 (L) 3.5 - 5.0 g/dL   AST 24 15 - 41 U/L   ALT 16 0 - 44 U/L   Alkaline Phosphatase 80 38 - 126 U/L   Total Bilirubin 1.4 (H) 0.3 - 1.2 mg/dL   Bilirubin, Direct 0.3 (H) 0.0 - 0.2 mg/dL   Indirect Bilirubin 1.1 (H) 0.3 - 0.9 mg/dL    Comment: Performed at [facility] [address], [place], [place]  [address]  Magnesium     Status: Abnormal   Collection Time: 05/16/19  6:09 AM  Result Value Ref Range   Magnesium 1.6 (L) 1.7 - 2.4 mg/dL    Comment: Performed at [facility] [address], [place], [place] [address]  TSH     Status: None   Collection Time: 05/16/19  6:09 AM  Result Value Ref Range   TSH 2.022 0.350 - 4.500 uIU/mL    Comment: Performed by a 3rd Generation assay with a functional sensitivity of <=0.01 uIU/mL. Performed at  [facility], [place] [address], [place], [place] [address]   Brain natriuretic peptide     Status: Abnormal   Collection Time: 05/16/19  6:09 AM  Result Value Ref Range   B Natriuretic Peptide 289.1 (H) 0.0 - 100.0 pg/mL    Comment: Performed at [facility] [address], [place], [place] [address]  CBC with Differential/Platelet     Status: Abnormal   Collection Time: 05/17/19  4:55 AM  Result Value Ref Range   WBC 5.2 4.0 - 10.5 K/uL   RBC 3.49 (L) 4.22 - 5.81 MIL/uL   Hemoglobin 9.9 (L) 13.0 - 17.0 g/dL   HCT 30.7 (L) 39.0 - 52.0 %   MCV 88.0 80.0 - 100.0 fL   MCH 28.4 26.0 - 34.0 pg   MCHC 32.2 30.0 - 36.0 g/dL   RDW 15.9 (H) 11.5 - 15.5 %   Platelets 109 (L) 150 - 400 K/uL    Comment: REPEATED TO VERIFY Immature Platelet Fraction may be clinically indicated, consider ordering this additional test [id number] CONSISTENT WITH PREVIOUS RESULT    nRBC 0.0 0.0 - 0.2 %   Neutrophils Relative % 71 %   Neutro Abs 3.7 1.7 - 7.7 K/uL   Lymphocytes Relative 19 %   Lymphs Abs 1.0 0.7 - 4.0 K/uL   Monocytes Relative 6 %   Monocytes Absolute 0.3 0.1 - 1.0 K/uL   Eosinophils Relative 3 %   Eosinophils Absolute 0.2 0.0 - 0.5 K/uL   Basophils Relative 1 %   Basophils Absolute 0.0 0.0 - 0.1 K/uL   Immature Granulocytes 0 %   Abs Immature Granulocytes 0.02 0.00 - 0.07 K/uL    Comment: Performed at [facility], [place] [address] [place], [place] [address]  Magnesium     Status: None   Collection Time: 05/17/19  4:55 AM   Result Value Ref Range   Magnesium 1.7 1.7 - 2.4 mg/dL    Comment: Performed at [facility] [address] [place], [place] [address]  Renal function panel     Status: Abnormal   Collection Time: 05/17/19  4:55 AM  Result Value Ref Range   Sodium 139 135 - 145 mmol/L   Potassium 3.3 (L) 3.5 - 5.1 mmol/L   Chloride 98 98 - 111 mmol/L   CO2 28 22 - 32 mmol/L   Glucose, Bld 94 70 - 99 mg/dL   BUN 20 8 - 23 mg/dL   Creatinine, Ser 1.44 (H) 0.61 - 1.24 mg/dL   Calcium 9.4 8.9 - 10.3 mg/dL   Phosphorus 3.3 2.5 - 4.6 mg/dL   Albumin 3.3 (L) 3.5 - 5.0 g/dL   GFR calc non Af Amer 47 (L) >60 mL/min   GFR calc Af Amer 54 (L) >60 mL/min   Anion gap 13 5 - 15    Comment: Performed at [facility] [address], [place],  [address]    Dg Chest 2 View  Result Date: 05/15/2019 CLINICAL DATA:  One-week history of shortness of breath that worsens with exertion. Former smoker. Prior CABG. Indwelling pacemaker. EXAM: CHEST - 2 VIEW COMPARISON:  03/19/2017 and earlier, including CT chest 05/03/2017 and earlier. FINDINGS: AP ERECT and LATERAL images were obtained, and the LATERAL image is blurred by respiratory motion. Prior sternotomy for CABG. Cardiac silhouette moderately enlarged, unchanged. LEFT subclavian dual lead transvenous pacemaker unchanged and appears intact. Thoracic aorta atherosclerotic, unchanged. Hilar and mediastinal contours otherwise unremarkable. Chronic interstitial  lung disease with a basilar predominance, likely pulmonary fibrosis, progressive since 2018. Pulmonary vascularity normal without evidence of pulmonary edema currently. No confluent airspace consolidation. No pleural effusions. Mild degenerative changes involving the thoracic spine. IMPRESSION: 1. Stable cardiomegaly. No acute cardiopulmonary disease. 2. 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 Electronically Signed   By: Evangeline Dakin M.D.   On: 05/15/2019 16:53   Dg Toe 4th Left  Result Date: 05/15/2019 CLINICAL DATA:  Pain of the fourth toe EXAM: LEFT FOURTH TOE COMPARISON:  07/30/2018 FINDINGS: There is an old osteotomy of the fifth metatarsal. No bone or joint pathology is seen affecting the fourth toe. Distal soft tissues appear somewhat irregular, possibly with some soft tissue gas. IMPRESSION: No bone or joint abnormality of the fourth toe. Question soft tissue abnormality distally, possibly with some soft tissue gas. Electronically Signed   By: Nelson Chimes M.D.   On: 05/15/2019 18:23   [place] Ascites (abdomen Limited)  Result Date: 05/16/2019 CLINICAL DATA:  Ascites. EXAM: LIMITED ABDOMEN ULTRASOUND FOR ASCITES TECHNIQUE: Limited ultrasound survey for ascites was performed in all four abdominal quadrants. COMPARISON:  None. FINDINGS: All 4 quadrants of the abdomen were imaged without significant free fluid identified. IMPRESSION: No evidence of ascites. Electronically Signed   By: [place] Bores M.D.   On: 05/16/2019 11:33    Review of Systems  Musculoskeletal: Positive for joint pain.  All other systems reviewed and are negative.  Blood pressure 120/82, pulse 99, temperature 98.2 F (36.8 C), temperature source Oral, resp. rate 18, height 5' 8.5" (1.74 m), weight 82.1 kg, SpO2 99 %. Physical Exam  Constitutional: He appears well-developed.  HENT:  Head: Normocephalic.  Eyes: Pupils are equal, round, and reactive to light.  Neck: Normal range of motion.  Cardiovascular: Normal rate.  Respiratory: Effort normal.  Neurological: He is alert.  Skin: Skin is warm.  Psychiatric: He has a normal mood and affect.  Examination of the left foot demonstrates some venous stasis changes in both legs with mild pitting edema.  Patient's fourth toe has an ulceration and some early ischemic changes distally.  No active fluctuance or erythema but there is evidence of chronic infection of the tip of the left fourth toe.   Ankle dorsiflexion plantarflexion inversion eversion intact.  Assessment/Plan: Impression is left fourth toe infection involving the distal digit.  Even though plain radiographs do not show obvious bony destruction the patient has an ulceration on the tip of his toe which does probe to the distal tip of the distal phalanx.  Patient needs toe amputation likely through the PIP joint.  Risk and benefits are discussed including but not limited to infection nerve vessel damage potential need for more surgery.  I do not see this problem being cured with antibiotics predictably given the duration of symptoms.  All questions answered.  Patient ate around 730 to 8:00 today and will plan for surgery at 4 PM today.  He is currently not on blood thinners.  G Scott Dean 05/17/2019, 2:10 PM

## 2019-05-17 NOTE — Transfer of Care (Signed)
Immediate Anesthesia Transfer of Care Note  Patient: [name].  Procedure(s) Performed: AMPUTATION LEFT FOURTH TOE (Left )  Patient Location: PACU  Anesthesia Type:General  Level of Consciousness: awake  Airway & Oxygen Therapy: Patient Spontanous Breathing and Patient connected to nasal cannula oxygen  Post-op Assessment: Report given to RN and Post -op Vital signs reviewed and stable  Post vital signs: Reviewed and stable  Last Vitals:  Vitals Value Taken Time  BP 96/60 05/17/2019  5:36 PM  Temp    Pulse 51 05/17/2019  5:40 PM  Resp 21 05/17/2019  5:40 PM  SpO2 100 % 05/17/2019  5:40 PM  Vitals shown include unvalidated device data.  Last Pain:  Vitals:   05/17/19 1535  TempSrc:   PainSc: 0-No pain      Patients Stated Pain Goal: 4 (97/41/63 8453)  Complications: No apparent anesthesia complications

## 2019-05-17 NOTE — Progress Notes (Signed)
[name]  PROGRESS NOTE    [name].  HWE:[id number] DOB: 12/26/1940 DOA: 05/15/2019 PCP: [name], MD   Brief Narrative:   Jamie Richardsonis a 78 y.o.malewithhistory of CAD status post CABG status post maze procedure, pacemaker placement for bradycardia A. fib, chronic anemia and thrombocytopenia and possible cirrhosis presented to the cardiology office for follow-up while increasing shortness of breath which has been ongoing for last few months. Patient states his shortness of breath further worsened acutely last few days. Has been a increasing lower extremity edema extending up to the thighs. Has noticed increasing erythema and discoloration of the left fourth toe last few days. Has been having frequent falls. Denies hitting his head or losing consciousness. Denies chest pain productive cough fever chills abdominal pain nausea or vomiting.  Patient has had a previous history of spontaneous subdural hematoma and is not on any anticoagulation.   Assessment & Plan:   Principal Problem:   Acute right heart failure (HCC) Active Problems:   Atrial fibrillation (HCC)   Sleep apnea   Pacemaker-St.Jude   Hx of CABG   Postinflammatory pulmonary fibrosis (HCC)   Thrombocytopenia (HCC)   History of primary testicular cancer   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[name])   Acute right heart failure      - last EF measured in January 2020 was 60 to 65% with severely dilated RV.       - cards onboard, appreciate assistnace      - Lasix 80 mg IV BID, spironolactone 25mg  qday     - repeat echo pending     - cards has s/o'd; rec lasix 80mg  BID and batt change on PPM in 4 - 5 weeks.   Lower extremity cellulitis with possible ischemic fourth toe of the left foot.     - ABI pending      - cefepime, vanc     - tells me that his wife has been clipping and removing his toe nail as it was digging into this skin; spoke with ortho, they have reviewed films and will take him  to the OR for amputation; see their note; appreciate their assistance  Cirrhosis of the liver     - [place] ab ordered     - LFTs ok  Chronic anemia and thrombocytopenia likely from cirrhosis of the liver     - Follow CBC.  History of Lewy body dementia per the chart.     - monitor  A. fib     - metoprolol rate control.       - Not on anticoagulation secondary history of spontaneous subdural hematoma in 2013.  Sleep apnea      - on CPAP at night  Deconditioning     - PT eval; likely related to HF     - rec HHPT   DVT prophylaxis: SCDs Code Status: FULL   Disposition Plan: TBD   Consultants:   Cardiology  Orthopedics  Antimicrobials:  . Vanc/ cefepime    Subjective: "Doc, my foot is still a pain. My wife use to dig at it."  Objective: Vitals:   05/16/19 1151 05/16/19 2027 05/17/19 0524 05/17/19 0536  BP: 114/74 113/75 120/82   Pulse: 92 92 99   Resp: 20 18 18    Temp: (!) 97.5 F (36.4 C) 97.7 F (36.5 C) 98.2 F (36.8 C)   TempSrc: Oral  Oral   SpO2: 96% 100% 99%   Weight:    82.1 kg  Height:        Intake/Output Summary (Last 24 hours) at 05/17/2019 0650 Last data filed at 05/17/2019 0535 Gross per 24 hour  Intake 1000 ml  Output 4600 ml  Net -3600 ml   Filed Weights   05/15/19 1534 05/16/19 0649 05/17/19 0536  Weight: 91.6 kg 85.2 kg 82.1 kg    Examination:  General exam: 78 y.o. male Appears calm and comfortable  Respiratory system: Clear to auscultation. Respiratory effort normal. Cardiovascular system: S1 & S2 heard, RRR. No JVD, murmurs, rubs, gallops or clicks. BLE edema Gastrointestinal system: Abdomen is distended, soft and nontender. No organomegaly or masses felt. Normal bowel sounds heard. Central nervous system: Alert and oriented. No focal neurological deficits. Extremities: Symmetric 5 x 5 power.    Data Reviewed: I have personally reviewed following labs and imaging studies.  CBC: Recent Labs  Lab 05/15/19 1535  05/16/19 0609 05/17/19 0455  WBC 4.1 4.5 5.2  NEUTROABS  --  3.3 3.7  HGB 9.5* 9.5* 9.9*  HCT 29.9* 28.9* 30.7*  MCV 90.9 88.9 88.0  PLT 93* 100* 845*   Basic Metabolic Panel: Recent Labs  Lab 05/15/19 1535 05/16/19 0609 05/17/19 0455  NA 140 141 139  K 3.8 3.4* 3.3*  CL 107 102 98  CO2 23 27 28   GLUCOSE 102* 92 94  BUN 20 17 20   CREATININE 1.37* 1.29* 1.44*  CALCIUM 8.9 9.1 9.4  MG  --  1.6* 1.7  PHOS  --   --  3.3   GFR: Estimated Creatinine Clearance: 43 mL/min (A) (by C-G formula based on SCr of 1.44 mg/dL (H)). Liver Function Tests: Recent Labs  Lab 05/15/19 1746 05/16/19 0609 05/17/19 0455  AST 28 24  --   ALT 16 16  --   ALKPHOS 81 80  --   BILITOT 1.0 1.4*  --   PROT 7.7 7.0  --   ALBUMIN 3.5 3.4* 3.3*   No results for input(s): LIPASE, AMYLASE in the last 168 hours. No results for input(s): AMMONIA in the last 168 hours. Coagulation Profile: No results for input(s): INR, PROTIME in the last 168 hours. Cardiac Enzymes: Recent Labs  Lab 05/15/19 1535  TROPONINI <0.03   BNP (last 3 results) No results for input(s): PROBNP in the last 8760 hours. HbA1C: No results for input(s): HGBA1C in the last 72 hours. CBG: No results for input(s): GLUCAP in the last 168 hours. Lipid Profile: No results for input(s): CHOL, HDL, LDLCALC, TRIG, CHOLHDL, LDLDIRECT in the last 72 hours. Thyroid Function Tests: Recent Labs    05/16/19 0609  TSH 2.022   Anemia Panel: No results for input(s): VITAMINB12, FOLATE, FERRITIN, TIBC, IRON, RETICCTPCT in the last 72 hours. Sepsis Labs: Recent Labs  Lab 05/15/19 1803  LATICACIDVEN 0.6    Recent Results (from the past 240 hour(s))  Culture, blood (routine x 2)     Status: None (Preliminary result)   Collection Time: 05/15/19  6:13 PM  Result Value Ref Range Status   Specimen Description BLOOD RIGHT ANTECUBITAL  Final   Special Requests   Final    BOTTLES DRAWN AEROBIC AND ANAEROBIC Blood Culture adequate  volume   Culture   Final    NO GROWTH < 24 HOURS Performed at [facility], [address], [place], [place] [address]    Report Status PENDING  Incomplete  SARS Coronavirus 2 (CEPHEID - Performed in [place] hospital lab), Hosp Order     Status: None  Collection Time: 05/15/19  6:22 PM  Result Value Ref Range Status   SARS Coronavirus 2 NEGATIVE NEGATIVE Final    Comment: (NOTE) If result is NEGATIVE SARS-CoV-2 target nucleic acids are NOT DETECTED. The SARS-CoV-2 RNA is generally detectable in upper and lower  respiratory specimens during the acute phase of infection. The lowest  concentration of SARS-CoV-2 viral copies this assay can detect is 250  copies / mL. A negative result does not preclude SARS-CoV-2 infection  and should not be used as the sole basis for treatment or other  patient management decisions.  A negative result may occur with  improper specimen collection / handling, submission of specimen other  than nasopharyngeal swab, presence of viral mutation(s) within the  areas targeted by this assay, and inadequate number of viral copies  (<250 copies / mL). A negative result must be combined with clinical  observations, patient history, and epidemiological information. If result is POSITIVE SARS-CoV-2 target nucleic acids are DETECTED. The SARS-CoV-2 RNA is generally detectable in upper and lower  respiratory specimens dur ing the acute phase of infection.  Positive  results are indicative of active infection with SARS-CoV-2.  Clinical  correlation with patient history and other diagnostic information is  necessary to determine patient infection status.  Positive results do  not rule out bacterial infection or co-infection with other viruses. If result is PRESUMPTIVE POSTIVE SARS-CoV-2 nucleic acids MAY BE PRESENT.   A presumptive positive result was obtained on the submitted specimen  and confirmed on repeat testing.  While 2019 novel coronavirus   (SARS-CoV-2) nucleic acids may be present in the submitted sample  additional confirmatory testing may be necessary for epidemiological  and / or clinical management purposes  to differentiate between  SARS-CoV-2 and other Sarbecovirus currently known to infect humans.  If clinically indicated additional testing with an alternate test  methodology [phone]) is advised. The SARS-CoV-2 RNA is generally  detectable in upper and lower respiratory sp ecimens during the acute  phase of infection. The expected result is Negative. Fact Sheet for Patients:  StrictlyIdeas.no Fact Sheet for Healthcare Providers: BankingDealers.co.za This test is not yet approved or cleared by the [place] FDA and has been authorized for detection and/or diagnosis of SARS-CoV-2 by FDA under an Emergency Use Authorization (EUA).  This EUA will remain in effect (meaning this test can be used) for the duration of the COVID-19 declaration under Section 564(b)(1) of the Act, 21 U.S.C. section 360bbb-3(b)(1), unless the authorization is terminated or revoked sooner. Performed at [facility], [place] [address], [place], [place] [address]   Culture, blood (routine x 2)     Status: None (Preliminary result)   Collection Time: 05/15/19  7:13 PM  Result Value Ref Range Status   Specimen Description BLOOD LEFT WRIST  Final   Special Requests   Final    BOTTLES DRAWN AEROBIC AND ANAEROBIC Blood Culture results may not be optimal due to an excessive volume of blood received in culture bottles   Culture   Final    NO GROWTH < 24 HOURS Performed at [facility], [place] [address] [place], [place] [address]    Report Status PENDING  Incomplete         Radiology Studies: Dg Chest 2 View  Result Date: 05/15/2019 CLINICAL DATA:  One-week history of shortness of breath that worsens with exertion. Former smoker. Prior CABG. Indwelling pacemaker. EXAM: CHEST - 2  VIEW COMPARISON:  03/19/2017 and earlier, including CT  chest 05/03/2017 and earlier. FINDINGS: AP ERECT and LATERAL images were obtained, and the LATERAL image is blurred by respiratory motion. Prior sternotomy for CABG. Cardiac silhouette moderately enlarged, unchanged. LEFT subclavian dual lead transvenous pacemaker unchanged and appears intact. Thoracic aorta atherosclerotic, unchanged. Hilar and mediastinal contours otherwise unremarkable. 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 Pulmonary vascularity normal without evidence of pulmonary edema currently. No confluent airspace consolidation. No pleural effusions. Mild degenerative changes involving the thoracic spine. IMPRESSION: 1. Stable cardiomegaly. No acute cardiopulmonary disease. 2. 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 Electronically Signed   By: Evangeline Dakin M.D.   On: 05/15/2019 16:53   Dg Toe 4th Left  Result Date: 05/15/2019 CLINICAL DATA:  Pain of the fourth toe EXAM: LEFT FOURTH TOE COMPARISON:  07/30/2018 FINDINGS: There is an old osteotomy of the fifth metatarsal. No bone or joint pathology is seen affecting the fourth toe. Distal soft tissues appear somewhat irregular, possibly with some soft tissue gas. IMPRESSION: No bone or joint abnormality of the fourth toe. Question soft tissue abnormality distally, possibly with some soft tissue gas. Electronically Signed   By: Nelson Chimes M.D.   On: 05/15/2019 18:23   [place] Ascites (abdomen Limited)  Result Date: 05/16/2019 CLINICAL DATA:  Ascites. EXAM: LIMITED ABDOMEN ULTRASOUND FOR ASCITES TECHNIQUE: Limited ultrasound survey for ascites was performed in all four abdominal quadrants. COMPARISON:  None. FINDINGS: All 4 quadrants of the abdomen were imaged without significant free fluid identified. IMPRESSION: No evidence of ascites. Electronically  Signed   By: Logan Bores M.D.   On: 05/16/2019 11:33        Scheduled Meds: . allopurinol  300 mg Oral Daily  . ferrous sulfate  325 mg Oral Q breakfast  . furosemide  80 mg Intravenous BID  . metoprolol tartrate  75 mg Oral BID  . mirabegron ER  50 mg Oral Daily  . pantoprazole  40 mg Oral Daily  . potassium chloride SA  20 mEq Oral Daily  . rosuvastatin  40 mg Oral Daily  . sertraline  200 mg Oral Daily  . sodium chloride flush  3 mL Intravenous Once  . spironolactone  25 mg Oral Daily  . tamsulosin  0.4 mg Oral QHS   Continuous Infusions: . ceFEPime (MAXIPIME) IV Stopped (05/16/19 2210)  . vancomycin Stopped (05/16/19 2330)     LOS: 2 days    Time spent: 35 minutes spent in the coordination of care today.     Jonnie Finner, DO [facility] Pager [phone]  If 7PM-7AM, please contact night-coverage [email or web address] Password [email or web address] 05/17/2019, 6:50 AM

## 2019-05-18 ENCOUNTER — Encounter (HOSPITAL_COMMUNITY): Payer: Self-pay | Admitting: Orthopedic Surgery

## 2019-05-18 LAB — CBC WITH DIFFERENTIAL/PLATELET
Abs Immature Granulocytes: 0.02 10*3/uL (ref 0.00–0.07)
Basophils Absolute: 0 10*3/uL (ref 0.0–0.1)
Basophils Relative: 0 %
Eosinophils Absolute: 0 10*3/uL (ref 0.0–0.5)
Eosinophils Relative: 0 %
HCT: 32.3 % — ABNORMAL LOW (ref 39.0–52.0)
Hemoglobin: 10.5 g/dL — ABNORMAL LOW (ref 13.0–17.0)
Immature Granulocytes: 0 %
Lymphocytes Relative: 12 %
Lymphs Abs: 0.8 10*3/uL (ref 0.7–4.0)
MCH: 28.8 pg (ref 26.0–34.0)
MCHC: 32.5 g/dL (ref 30.0–36.0)
MCV: 88.5 fL (ref 80.0–100.0)
Monocytes Absolute: 0.2 10*3/uL (ref 0.1–1.0)
Monocytes Relative: 4 %
Neutro Abs: 5.2 10*3/uL (ref 1.7–7.7)
Neutrophils Relative %: 84 %
Platelets: 112 10*3/uL — ABNORMAL LOW (ref 150–400)
RBC: 3.65 MIL/uL — ABNORMAL LOW (ref 4.22–5.81)
RDW: 15.6 % — ABNORMAL HIGH (ref 11.5–15.5)
WBC: 6.3 10*3/uL (ref 4.0–10.5)
nRBC: 0 % (ref 0.0–0.2)

## 2019-05-18 LAB — GLUCOSE, CAPILLARY: Glucose-Capillary: 114 mg/dL — ABNORMAL HIGH (ref 70–99)

## 2019-05-18 LAB — RENAL FUNCTION PANEL
Albumin: 3.3 g/dL — ABNORMAL LOW (ref 3.5–5.0)
Anion gap: 9 (ref 5–15)
BUN: 28 mg/dL — ABNORMAL HIGH (ref 8–23)
CO2: 32 mmol/L (ref 22–32)
Calcium: 9.3 mg/dL (ref 8.9–10.3)
Chloride: 98 mmol/L (ref 98–111)
Creatinine, Ser: 1.65 mg/dL — ABNORMAL HIGH (ref 0.61–1.24)
GFR calc Af Amer: 46 mL/min — ABNORMAL LOW (ref >60)
GFR calc non Af Amer: 40 mL/min — ABNORMAL LOW (ref >60)
Glucose, Bld: 118 mg/dL — ABNORMAL HIGH (ref 70–99)
Phosphorus: 3.3 mg/dL (ref 2.5–4.6)
Potassium: 4.1 mmol/L (ref 3.5–5.1)
Sodium: 139 mmol/L (ref 135–145)

## 2019-05-18 LAB — MAGNESIUM: Magnesium: 1.8 mg/dL (ref 1.7–2.4)

## 2019-05-18 NOTE — Plan of Care (Signed)
Patient stable, discussed POC with patient, agreeable with plan.  OOB to chair tolerated well, denies question/concerns at this time.

## 2019-05-18 NOTE — Progress Notes (Signed)
[name]  PROGRESS NOTE    Ignatius Di Kindle.  DZH:[id number] DOB: Aug 10, 1941 DOA: 05/15/2019 PCP: [name], MD   Brief Narrative:   Concha Pyo Jr.is a 78 y.o.malewithhistory of CAD status post CABG status post maze procedure, pacemaker placement for bradycardia A. fib, chronic anemia and thrombocytopenia and possible cirrhosis presented to the cardiology office for follow-up while increasing shortness of breath which has been ongoing for last few months. Patient states his shortness of breath further worsened acutely last few days. Has been a increasing lower extremity edema extending up to the thighs. Has noticed increasing erythema and discoloration of the left fourth toe last few days. Has been having frequent falls. Denies hitting his head or losing consciousness. Denies chest pain productive cough fever chills abdominal pain nausea or vomiting.  Patient has had a previous history of spontaneous subdural hematoma and is not on any anticoagulation.   Assessment & Plan:   Principal Problem:   Acute right heart failure (HCC) Active Problems:   Atrial fibrillation (HCC)   Sleep apnea   Pacemaker-[employer]ude   Hx of CABG   Postinflammatory pulmonary fibrosis (HCC)   Thrombocytopenia (HCC)   History of primary testicular cancer   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)   Toe infection   Acute right heart failure  - last EF measured in January 2020 was 60 to 65% with severely dilated RV.  - cards onboard, appreciate assistnace  - Lasix 80 mg IV BID, spironolactone 25mg  qday - repeat echo pending     - cards has s/o'd; rec lasix 80mg  BID and batt change on PPM in 4 - 5 weeks.   Lower extremity cellulitis with possible ischemic fourth toe of the left foot. - ABI pending  - cefepime, vanc - tells me that his wife has been clipping and removing his toe nail as it was digging into this skin; spoke with ortho, they have reviewed films  and will take him to the OR for amputation; see their note; appreciate their assistance     - s/p amputation  Cirrhosis of the liver - US ab ordered - LFTs ok  Chronic anemia and thrombocytopenia likely from cirrhosis of the liver - Follow CBC.  History of Lewy body dementia per the chart. - monitor  A. fib - metoprolol rate control.  - Not on anticoagulation secondary history of spontaneous subdural hematoma in 2013.  Sleep apnea -on CPAPat night  Deconditioning - PT eval; likely related to HF     - rec HHPT  Hypotensive this AM. Held lasix, metoprolol. Parameters are in place. Resume this evening w/ PO lasix. Hopefully home in AM.   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 Vanc, cefepime    Subjective: "I guess I'm ok."  Objective: Vitals:   05/18/19 0537 05/18/19 0548 05/18/19 0853 05/18/19 1159  BP: (!) 84/72 93/60 (!) 103/54 117/78  Pulse: (!) 53  (!) 54 97  Resp: 18   18  Temp: 98 F (36.7 C)   97.7 F (36.5 C)  TempSrc: Oral   Oral  SpO2: 100%  100% 100%  Weight: 82.6 kg     Height:        Intake/Output Summary (Last 24 hours) at 05/18/2019 1454 Last data filed at 05/18/2019 1300 Gross per 24 hour  Intake 2458.66 ml  Output 1530 ml  Net 928.66 ml  Filed Weights   05/16/19 0649 05/17/19 0536 05/18/19 0537  Weight: 85.2 kg 82.1 kg 82.6 kg    Examination:  General exam:78 y.o.maleAppears calm and comfortable  Respiratory system: Clear to auscultation. Respiratory effort normal. Cardiovascular system:S1 &S2 heard, RRR. No JVD, murmurs, rubs, gallops or clicks. BLE edema Gastrointestinal system:Abdomen is distended, soft and nontender. No organomegaly or masses felt. Normal bowel sounds heard. Central nervous system:Alert and oriented. No focal neurological deficits.  Extremities: Symmetric 5 x 5 power.   Data Reviewed: I have personally reviewed following labs and imaging studies.  CBC: Recent Labs  Lab 05/15/19 1535 05/16/19 0609 05/17/19 0455 05/18/19 0707  WBC 4.1 4.5 5.2 6.3  NEUTROABS  --  3.3 3.7 5.2  HGB 9.5* 9.5* 9.9* 10.5*  HCT 29.9* 28.9* 30.7* 32.3*  MCV 90.9 88.9 88.0 88.5  PLT 93* 100* 109* 102*   Basic Metabolic Panel: Recent Labs  Lab 05/15/19 1535 05/16/19 0609 05/17/19 0455 05/18/19 0707  NA 140 141 139 139  K 3.8 3.4* 3.3* 4.1  CL 107 102 98 98  CO2 23 27 28  32  GLUCOSE 102* 92 94 118*  BUN 20 17 20  28*  CREATININE 1.37* 1.29* 1.44* 1.65*  CALCIUM 8.9 9.1 9.4 9.3  MG  --  1.6* 1.7 1.8  PHOS  --   --  3.3 3.3   GFR: Estimated Creatinine Clearance: 37.5 mL/min (A) (by C-G formula based on SCr of 1.65 mg/dL (H)). Liver Function Tests: Recent Labs  Lab 05/15/19 1746 05/16/19 0609 05/17/19 0455 05/18/19 0707  AST 28 24  --   --   ALT 16 16  --   --   ALKPHOS 81 80  --   --   BILITOT 1.0 1.4*  --   --   PROT 7.7 7.0  --   --   ALBUMIN 3.5 3.4* 3.3* 3.3*   No results for input(s): LIPASE, AMYLASE in the last 168 hours. No results for input(s): AMMONIA in the last 168 hours. Coagulation Profile: No results for input(s): INR, PROTIME in the last 168 hours. Cardiac Enzymes: Recent Labs  Lab 05/15/19 1535  TROPONINI <0.03   BNP (last 3 results) No results for input(s): PROBNP in the last 8760 hours. HbA1C: No results for input(s): HGBA1C in the last 72 hours. CBG: Recent Labs  Lab 05/17/19 1750 05/17/19 2113 05/18/19 0650  GLUCAP 102* 114* 114*   Lipid Profile: No results for input(s): CHOL, HDL, LDLCALC, TRIG, CHOLHDL, LDLDIRECT in the last 72 hours. Thyroid Function Tests: Recent Labs    05/16/19 0609  TSH 2.022   Anemia Panel: No results for input(s): VITAMINB12, FOLATE, FERRITIN, TIBC, IRON, RETICCTPCT in the last 72 hours. Sepsis Labs: Recent Labs  Lab 05/15/19 1803   LATICACIDVEN 0.6    Recent Results (from the past 240 hour(s))  Culture, blood (routine x 2)     Status: None (Preliminary result)   Collection Time: 05/15/19  6:13 PM  Result Value Ref Range Status   Specimen Description BLOOD RIGHT ANTECUBITAL  Final   Special Requests   Final    BOTTLES DRAWN AEROBIC AND ANAEROBIC Blood Culture adequate volume   Culture   Final    NO GROWTH 2 DAYS Performed at [facility], [address] [place], [place] [address]    Report Status PENDING  Incomplete  SARS Coronavirus 2 (CEPHEID - Performed in [place] hospital lab), Hosp Order     Status: None   Collection Time: 05/15/19  6:22 PM  Result Value Ref Range Status   SARS Coronavirus 2 NEGATIVE NEGATIVE Final    Comment: (NOTE) If result is NEGATIVE SARS-CoV-2 target nucleic acids are NOT DETECTED. The SARS-CoV-2 RNA is generally detectable in upper and lower  respiratory specimens during the acute phase of infection. The lowest  concentration of SARS-CoV-2 viral copies this assay can detect is 250  copies / mL. A negative result does not preclude SARS-CoV-2 infection  and should not be used as the sole basis for treatment or other  patient management decisions.  A negative result may occur with  improper specimen collection / handling, submission of specimen other  than nasopharyngeal swab, presence of viral mutation(s) within the  areas targeted by this assay, and inadequate number of viral copies  (<250 copies / mL). A negative result must be combined with clinical  observations, patient history, and epidemiological information. If result is POSITIVE SARS-CoV-2 target nucleic acids are DETECTED. The SARS-CoV-2 RNA is generally detectable in upper and lower  respiratory specimens dur ing the acute phase of infection.  Positive  results are indicative of active infection with SARS-CoV-2.  Clinical  correlation with patient history and other diagnostic information is  necessary to  determine patient infection status.  Positive results do  not rule out bacterial infection or co-infection with other viruses. If result is PRESUMPTIVE POSTIVE SARS-CoV-2 nucleic acids MAY BE PRESENT.   A presumptive positive result was obtained on the submitted specimen  and confirmed on repeat testing.  While 2019 novel coronavirus  (SARS-CoV-2) nucleic acids may be present in the submitted sample  additional confirmatory testing may be necessary for epidemiological  and / or clinical management purposes  to differentiate between  SARS-CoV-2 and other Sarbecovirus currently known to infect humans.  If clinically indicated additional testing with an alternate test  methodology [phone]) is advised. The SARS-CoV-2 RNA is generally  detectable in upper and lower respiratory sp ecimens during the acute  phase of infection. The expected result is Negative. Fact Sheet for Patients:  StrictlyIdeas.no Fact Sheet for Healthcare Providers: BankingDealers.co.za This test is not yet approved or cleared by the [place] FDA and has been authorized for detection and/or diagnosis of SARS-CoV-2 by FDA under an Emergency Use Authorization (EUA).  This EUA will remain in effect (meaning this test can be used) for the duration of the COVID-19 declaration under Section 564(b)(1) of the Act, 21 U.S.C. section 360bbb-3(b)(1), unless the authorization is terminated or revoked sooner. Performed at [facility], [place] [address] [place], [place] [address]   Culture, blood (routine x 2)     Status: None (Preliminary result)   Collection Time: 05/15/19  7:13 PM  Result Value Ref Range Status   Specimen Description BLOOD LEFT WRIST  Final   Special Requests   Final    BOTTLES DRAWN AEROBIC AND ANAEROBIC Blood Culture results may not be optimal due to an excessive volume of blood received in culture bottles   Culture   Final    NO GROWTH 2 DAYS  Performed at [facility], [place] [address], [place], [place] [address]    Report Status PENDING  Incomplete         Radiology Studies: No results found.      Scheduled Meds: . allopurinol  300 mg Oral Daily  . docusate sodium  100 mg Oral BID  . ferrous sulfate  325 mg Oral Q breakfast  . gabapentin  300 mg Oral TID  .  metoprolol tartrate  75 mg Oral BID  . mirabegron ER  50 mg Oral Daily  . pantoprazole  40 mg Oral Daily  . potassium chloride SA  20 mEq Oral Daily  . rosuvastatin  40 mg Oral Daily  . sertraline  200 mg Oral Daily  . sodium chloride flush  3 mL Intravenous Once  . spironolactone  25 mg Oral Daily  . tamsulosin  0.4 mg Oral QHS   Continuous Infusions: . ceFEPime (MAXIPIME) IV Stopped (05/18/19 0856)  . lactated ringers 10 mL/hr at 05/18/19 0649  . vancomycin Stopped (05/17/19 2245)     LOS: 3 days    Time spent: 25 minutes spent in the coordination of care.     Jonnie Finner, DO [facility] Pager [phone]  If 7PM-7AM, please contact night-coverage [email or web address] Password [email or web address] 05/18/2019, 2:54 PM

## 2019-05-18 NOTE — Progress Notes (Signed)
Patient uses home CPAP. Patient is able to place himself on/off without assistance. RT filled humidifier with sterile water. RT will monitor as needed.

## 2019-05-18 NOTE — Progress Notes (Signed)
Noted pt sbp 80-90's earlier this am, pt bp 103/51 map 71 this am, hr 50's apaced, paged Dr Marylyn Ishihara to advise meds as pt is due lasix iv and lopressor po

## 2019-05-19 ENCOUNTER — Encounter: Payer: Medicare Other | Admitting: Internal Medicine

## 2019-05-19 ENCOUNTER — Inpatient Hospital Stay (HOSPITAL_COMMUNITY): Payer: Medicare Other

## 2019-05-19 ENCOUNTER — Encounter: Payer: Medicare Other | Admitting: Nurse Practitioner

## 2019-05-19 DIAGNOSIS — R52 Pain, unspecified: Secondary | ICD-10-CM

## 2019-05-19 LAB — CBC WITH DIFFERENTIAL/PLATELET
Abs Immature Granulocytes: 0.02 10*3/uL (ref 0.00–0.07)
Basophils Absolute: 0.1 10*3/uL (ref 0.0–0.1)
Basophils Relative: 1 %
Eosinophils Absolute: 0.2 10*3/uL (ref 0.0–0.5)
Eosinophils Relative: 3 %
HCT: 30.2 % — ABNORMAL LOW (ref 39.0–52.0)
Hemoglobin: 9.6 g/dL — ABNORMAL LOW (ref 13.0–17.0)
Immature Granulocytes: 0 %
Lymphocytes Relative: 24 %
Lymphs Abs: 1.4 10*3/uL (ref 0.7–4.0)
MCH: 28.7 pg (ref 26.0–34.0)
MCHC: 31.8 g/dL (ref 30.0–36.0)
MCV: 90.4 fL (ref 80.0–100.0)
Monocytes Absolute: 0.4 10*3/uL (ref 0.1–1.0)
Monocytes Relative: 8 %
Neutro Abs: 3.7 10*3/uL (ref 1.7–7.7)
Neutrophils Relative %: 64 %
Platelets: 95 10*3/uL — ABNORMAL LOW (ref 150–400)
RBC: 3.34 MIL/uL — ABNORMAL LOW (ref 4.22–5.81)
RDW: 15.9 % — ABNORMAL HIGH (ref 11.5–15.5)
WBC: 5.8 10*3/uL (ref 4.0–10.5)
nRBC: 0 % (ref 0.0–0.2)

## 2019-05-19 LAB — RENAL FUNCTION PANEL
Albumin: 3.1 g/dL — ABNORMAL LOW (ref 3.5–5.0)
Anion gap: 10 (ref 5–15)
BUN: 32 mg/dL — ABNORMAL HIGH (ref 8–23)
CO2: 30 mmol/L (ref 22–32)
Calcium: 9.1 mg/dL (ref 8.9–10.3)
Chloride: 100 mmol/L (ref 98–111)
Creatinine, Ser: 1.69 mg/dL — ABNORMAL HIGH (ref 0.61–1.24)
GFR calc Af Amer: 45 mL/min — ABNORMAL LOW (ref >60)
GFR calc non Af Amer: 39 mL/min — ABNORMAL LOW (ref >60)
Glucose, Bld: 96 mg/dL (ref 70–99)
Phosphorus: 4 mg/dL (ref 2.5–4.6)
Potassium: 4.1 mmol/L (ref 3.5–5.1)
Sodium: 140 mmol/L (ref 135–145)

## 2019-05-19 LAB — MAGNESIUM: Magnesium: 2 mg/dL (ref 1.7–2.4)

## 2019-05-19 MED ORDER — METOPROLOL TARTRATE 25 MG PO TABS
25.0000 mg | ORAL_TABLET | Freq: Two times a day (BID) | ORAL | Status: DC
  Filled 2019-05-19 (×2): qty 1

## 2019-05-19 MED ORDER — SODIUM CHLORIDE 0.9 % IV SOLN
INTRAVENOUS | Status: DC | PRN
  Administered 2019-05-19: 20:00:00 250 mL via INTRAVENOUS

## 2019-05-19 MED ORDER — FUROSEMIDE 80 MG PO TABS
80.0000 mg | ORAL_TABLET | Freq: Two times a day (BID) | ORAL | Status: DC
  Administered 2019-05-19 – 2019-05-20 (×3): 80 mg via ORAL
  Filled 2019-05-19 (×3): qty 1

## 2019-05-19 MED ORDER — VANCOMYCIN HCL IN DEXTROSE 750-5 MG/150ML-% IV SOLN
750.0000 mg | INTRAVENOUS | Status: DC
  Administered 2019-05-19: 21:00:00 750 mg via INTRAVENOUS
  Filled 2019-05-19: qty 150

## 2019-05-19 NOTE — TOC Initial Note (Signed)
Transition of Care (TOC) - Initial/Assessment Note    Patient Details  Name: [name]. MRN: [id number] Date of Birth: October 05, 1941  Transition of Care [facility]) CM/SW Contact:    Sherrilyn Rist Phone Number: [phone] 05/19/2019, 2:07 PM  Clinical Narrative:                 Patient lives at home with spouse; PCP is Dr Shelia Media; has private insurance with Medicare; [facility] [address]; DME - cane and several walkers at home; patient states that he still use salt in his diet; pt encouraged not to use salt in his diet and he has scales at home and knows to weigh himself daily; [facility] choice offered, pt chose [facility] [place]; Dan with [facility] called for arrangements. CM will continue to follow for progression of care.  Expected Discharge Plan: Home/Self Care Barriers to Discharge: No Barriers Identified   Patient Goals and CMS Choice Patient states their goals for this hospitalization and ongoing recovery are:: to get stronger CMS Medicare.gov Compare Post Acute Care list provided to:: Patient Choice offered to / list presented to : Patient  Expected Discharge Plan and Services Expected Discharge Plan: Home/Self Care In-house Referral: [facility] Discharge Planning Services: CM Consult Post Acute Care Choice: NA Living arrangements for the past 2 months: Single Family Home                 DME Arranged: N/A DME Agency: NA       HH Arranged: RN, Disease Management, PT, Nurse's Aide [place] Agency: [place] ([facility]) Date HH Agency Contacted: 05/19/19 Time [place]: 1406 Representative spoke with at [place]: Hydrographic surveyor  Prior Living Arrangements/Services Living arrangements for the past 2 months: [place] with:: Spouse Patient language and need for interpreter reviewed:: Yes Do you feel safe going back to the place where you live?: Yes      Need for Family Participation in Patient Care: Yes  (Comment) Care giver support system in place?: Yes (comment)   Criminal Activity/Legal Involvement Pertinent to Current Situation/Hospitalization: No - Comment as needed  Activities of Daily Living Home Assistive Devices/Equipment: Cane (specify quad or straight)(straight) ADL Screening (condition at time of admission) Patient's cognitive ability adequate to safely complete daily activities?: Yes Is the patient deaf or have difficulty hearing?: No Does the patient have difficulty seeing, even when wearing glasses/contacts?: No Does the patient have difficulty concentrating, remembering, or making decisions?: No Patient able to express need for assistance with ADLs?: Yes Does the patient have difficulty dressing or bathing?: No Independently performs ADLs?: Yes (appropriate for developmental age) Does the patient have difficulty walking or climbing stairs?: Yes Weakness of Legs: Both Weakness of Arms/Hands: None  Permission Sought/Granted Permission sought to share information with : Case Manager Permission granted to share information with : Yes, Verbal Permission Granted  Share Information with NAME: spouse Vickie  Permission granted to share info w AGENCY: [place] agency        Emotional Assessment Appearance:: Developmentally appropriate Attitude/Demeanor/Rapport: Gracious, Engaged Affect (typically observed): Accepting Orientation: : Oriented to Self, Oriented to  Time, Oriented to Place, Oriented to Situation Alcohol / Substance Use: Not Applicable Psych Involvement: No (comment)  Admission diagnosis:  SOB (shortness of breath) [R06.02] Cellulitis of fourth toe of left foot [L03.032] Acute right heart failure ([place]) [I50.811] Patient Active Problem List   Diagnosis Date Noted  . Toe infection 05/17/2019  . DOE (  dyspnea on exertion) 05/15/2019  . Severe tricuspid regurgitation 05/15/2019  . Acute right heart failure ([place]) 05/15/2019  . Cellulitis of fourth toe of left foot    . Excessive daytime sleepiness 02/11/2019  . 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[place]) 02/11/2019  . Iron deficiency anemia 02/04/2019  . Poor memory 12/16/2018  . Deficiency anemia 11/19/2018  . Other fatigue 11/19/2018  . History of elevated PSA 11/19/2018  . Prostate cancer screening 11/19/2018  . Prostate CA ([place]) 11/19/2018  . Pancytopenia, acquired ([place]) 08/13/2018  . Recurrent falls 08/13/2018  . 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 Peripheral neuropathy 07/31/2018  . 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 Anemia, chronic disease 01/28/2018  . History of skin cancer 07/27/2017  . Fatty liver disease, nonalcoholic 70/26/3785  . Thrombocytopenia ([place]) 11/27/2016  . Lung nodule 04/23/2014  . Postinflammatory pulmonary fibrosis ([place]) 06/13/2013  . Long term (current) use of anticoagulants 11/22/2011  . Abdominal bruit 03/07/2011  . Pacemaker-St.Jude   . Hx of CABG   . Hyperlipidemia type II   . OSA (obstructive sleep apnea)   . PACEMAKER, PERMANENT 08/17/2010  . DIABETES MELLITUS 08/16/2010  . HYPERLIPIDEMIA 08/16/2010  . Obesity 08/16/2010  . Essential hypertension 08/16/2010  . Atrial fibrillation ([place]) 08/16/2010  . IRREGULAR HEART RATE 08/16/2010  . Sleep apnea 08/16/2010  . BENIGN PROSTATIC HYPERTROPHY, HX OF 08/16/2010   PCP:  [name], MD Pharmacy:   [id number] [place], [place] [address] [place] [address] Phone: [phone] Fax: [phone]     Social Determinants of Health (SDOH) Interventions    Readmission Risk Interventions No flowsheet data found.

## 2019-05-19 NOTE — Progress Notes (Signed)
PT Cancellation Note  Patient Details Name: [name]. MRN: [id number] DOB: October 19, 1941   Cancelled Treatment:    Reason Eval/Treat Not Completed: Other (comment)(await left post op shoe for mobility)   Blinda Turek B Cailey [place] 05/19/2019, 8:55 AM  Elwyn Reach, PT Acute Rehabilitation Services Pager: [phone] Office: [phone]

## 2019-05-19 NOTE — Op Note (Signed)
NAME: Boxell JR., [place] JO:[id number] ACCOUNT [id number] DATE OF BIRTH:[birth year] FACILITY: [facility] LOCATION: [facility] PHYSICIAN:Hyden Soley Randel Pigg, MD  OPERATIVE REPORT  DATE OF PROCEDURE:  05/17/2019  PREOPERATIVE DIAGNOSIS:  Left 4th toe infection.  POSTOPERATIVE DIAGNOSIS:  Left 4th toe infection.  PROCEDURE:  Left 4th toe amputation through the proximal phalanx.  SURGEON:  Meredith Pel, MD  ASSISTANT:  None.  INDICATIONS:  This is a 78 year old patient with a left toe infection and presents for operative management after explanation of risks and benefits.  PROCEDURE IN DETAIL:  The patient was brought to the operating room where general anesthetic was induced.  Preoperative antibiotics were administered.  Timeout was called.  The left leg was prepped with Hibiclens solution and draped in a sterile manner.   Tourniquet was not utilized.  A fish-mouth incision was made over the central portion of the proximal phalanx.  The distal part of the toe was infected, and this infection went down to the distal phalanx.  A fish-mouth incision was made and the PIP  joint was incised.  Skeletonization of the proximal phalanx was performed, and it was cut at its midshaft using plier-type cutters.  At this time, thorough irrigation was performed and the skin edges were tapered.  Thorough irrigation was performed with  a liter of irrigating solution.  Soft tissue was then used to close over the bone using a 3-0 Vicryl.  A 3-0 nylon was then used to close the remaining portion of the skin flaps together.  The skin flaps did appear to be viable.  A numbing medicine was  placed then around the metatarsal head for postop anesthesia.  A bulky dressing was applied.  The patient tolerated the procedure well without immediate complication.     LN/NUANCE  D:05/17/2019 T:05/17/2019 JOB:[id number]

## 2019-05-19 NOTE — Progress Notes (Signed)
[name]  PROGRESS NOTE    [name].  ZOX:[id number] DOB: 1941/05/24 DOA: 05/15/2019 PCP: [name], MD   Brief Narrative:   Jamie Richardsonis a 78 y.o.malewithhistory of CAD status post CABG status post maze procedure, pacemaker placement for bradycardia A. fib, chronic anemia and thrombocytopenia and possible cirrhosis presented to the cardiology office for follow-up while increasing shortness of breath which has been ongoing for last few months. Patient states his shortness of breath further worsened acutely last few days. Has been a increasing lower extremity edema extending up to the thighs. Has noticed increasing erythema and discoloration of the left fourth toe last few days. Has been having frequent falls. Denies hitting his head or losing consciousness. 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  - cards onboard, appreciate assistnace  - Lasix 80 mg IV BID, spironolactone 25mg  qday - repeat echo pending - cards has s/o'd; rec lasix 80mg  BID and batt change on PPM in 4 - 5 weeks.     - decrease metoprolol to 25 mg BID as he is brady and hypotensive  Lower extremity cellulitis with possible ischemic fourth toe of the left foot. - ABI pending  - cefepime, vanc -tells me that his wife has been clipping and removing his toe nail as it was digging into this skin; spoke with ortho, they have reviewed films and will take him to the OR for amputation; see their  note; appreciate their assistance     - s/p amputation  Cirrhosis of the liver - US ab ordered - LFTs ok  Chronic anemia and thrombocytopenia likely from cirrhosis of the liver - Follow CBC.  History of Lewy body dementia per the chart. - monitor  A. fib - metoprolol rate control.  - Not on anticoagulation secondary history of spontaneous subdural hematoma in 2013.     - decrease metoprolol to 25mg  BID as he is brady and hypotensive  Sleep apnea -on CPAPat night  Deconditioning - PT eval; likely related to HF - rec HHPT  DVT prophylaxis:SCDs Code Status:FULL Disposition Plan:TBD  Consultants:   Cardiology  Orthopedics  Procedures:   Left 4th toe amputation  Antimicrobials:  . Vanc, cefepime    Subjective: "I'm here. What's going on?"  Objective: Vitals:   05/18/19 1956 05/19/19 0413 05/19/19 0500 05/19/19 1052  BP: 117/85 104/69  96/66  Pulse: 84 89  (!) 54  Resp:      Temp: 98 F (36.7 C) 98.1 F (36.7 C)    TempSrc:  Oral    SpO2: 98% 99%    Weight:   82 kg   Height:        Intake/Output Summary (Last 24 hours) at 05/19/2019 1119 Last data filed at 05/19/2019 0418 Gross per 24 hour  Intake 530 ml  Output 1300 ml  Net -770 ml   Filed Weights   05/17/19 0536 05/18/19 0537 05/19/19 0500  Weight: 82.1 kg 82.6 kg 82 kg  Examination:  General exam:78 y.o.maleAppears calm and comfortable  Respiratory system: Clear to auscultation. Respiratory effort normal. Cardiovascular system:S1 &S2 heard, RRR. No JVD, murmurs, rubs, gallops or clicks. BLE edema Gastrointestinal system:Abdomen is distended, soft and nontender. No organomegaly or masses felt. Normal bowel sounds heard. Central nervous system:Alert and oriented. No focal neurological deficits. Extremities: Symmetric 5 x 5 power    Data Reviewed: I have personally reviewed following labs and imaging studies.  CBC: Recent Labs   Lab 06-06-19 1535 05/16/19 0609 05/17/19 0455 05/18/19 0707 05/19/19 0643  WBC 4.1 4.5 5.2 6.3 5.8  NEUTROABS  --  3.3 3.7 5.2 3.7  HGB 9.5* 9.5* 9.9* 10.5* 9.6*  HCT 29.9* 28.9* 30.7* 32.3* 30.2*  MCV 90.9 88.9 88.0 88.5 90.4  PLT 93* 100* 109* 112* 95*   Basic Metabolic Panel: Recent Labs  Lab 2019-06-06 1535 05/16/19 0609 05/17/19 0455 05/18/19 0707 05/19/19 0643  NA 140 141 139 139 140  K 3.8 3.4* 3.3* 4.1 4.1  CL 107 102 98 98 100  CO2 23 27 28  32 30  GLUCOSE 102* 92 94 118* 96  BUN 20 17 20  28* 32*  CREATININE 1.37* 1.29* 1.44* 1.65* 1.69*  CALCIUM 8.9 9.1 9.4 9.3 9.1  MG  --  1.6* 1.7 1.8 2.0  PHOS  --   --  3.3 3.3 4.0   GFR: Estimated Creatinine Clearance: 36.6 mL/min (A) (by C-G formula based on SCr of 1.69 mg/dL (H)). Liver Function Tests: Recent Labs  Lab 2019/06/06 1746 05/16/19 0609 05/17/19 0455 05/18/19 0707 05/19/19 0643  AST 28 24  --   --   --   ALT 16 16  --   --   --   ALKPHOS 81 80  --   --   --   BILITOT 1.0 1.4*  --   --   --   PROT 7.7 7.0  --   --   --   ALBUMIN 3.5 3.4* 3.3* 3.3* 3.1*   No results for input(s): LIPASE, AMYLASE in the last 168 hours. No results for input(s): AMMONIA in the last 168 hours. Coagulation Profile: No results for input(s): INR, PROTIME in the last 168 hours. Cardiac Enzymes: Recent Labs  Lab 06-06-2019 1535  TROPONINI <0.03   BNP (last 3 results) No results for input(s): PROBNP in the last 8760 hours. HbA1C: No results for input(s): HGBA1C in the last 72 hours. CBG: Recent Labs  Lab 05/17/19 1750 05/17/19 2113 05/18/19 0650  GLUCAP 102* 114* 114*   Lipid Profile: No results for input(s): CHOL, HDL, LDLCALC, TRIG, CHOLHDL, LDLDIRECT in the last 72 hours. Thyroid Function Tests: No results for input(s): TSH, T4TOTAL, FREET4, T3FREE, THYROIDAB in the last 72 hours. Anemia Panel: No results for input(s): VITAMINB12, FOLATE, FERRITIN, TIBC, IRON, RETICCTPCT in the last 72 hours. Sepsis Labs:  Recent Labs  Lab 2019/06/06 1803  LATICACIDVEN 0.6    Recent Results (from the past 240 hour(s))  Culture, blood (routine x 2)     Status: None (Preliminary result)   Collection Time: 2019-06-06  6:13 PM  Result Value Ref Range Status   Specimen Description BLOOD RIGHT ANTECUBITAL  Final   Special Requests   Final    BOTTLES DRAWN AEROBIC AND ANAEROBIC Blood Culture adequate volume   Culture   Final    NO GROWTH 4 DAYS Performed at [facility], [address], [place], [place] [address]    Report Status PENDING  Incomplete  SARS Coronavirus 2 (CEPHEID -  Performed in [facility] hospital lab), Hosp Order     Status: None   Collection Time: 05/15/19  6:22 PM  Result Value Ref Range Status   SARS Coronavirus 2 NEGATIVE NEGATIVE Final    Comment: (NOTE) If result is NEGATIVE SARS-CoV-2 target nucleic acids are NOT DETECTED. The SARS-CoV-2 RNA is generally detectable in upper and lower  respiratory specimens during the acute phase of infection. The lowest  concentration of SARS-CoV-2 viral copies this assay can detect is 250  copies / mL. A negative result does not preclude SARS-CoV-2 infection  and should not be used as the sole basis for treatment or other  patient management decisions.  A negative result may occur with  improper specimen collection / handling, submission of specimen other  than nasopharyngeal swab, presence of viral mutation(s) within the  areas targeted by this assay, and inadequate number of viral copies  (<250 copies / mL). A negative result must be combined with clinical  observations, patient history, and epidemiological information. If result is POSITIVE SARS-CoV-2 target nucleic acids are DETECTED. The SARS-CoV-2 RNA is generally detectable in upper and lower  respiratory specimens dur ing the acute phase of infection.  Positive  results are indicative of active infection with SARS-CoV-2.  Clinical  correlation with patient history and other  diagnostic information is  necessary to determine patient infection status.  Positive results do  not rule out bacterial infection or co-infection with other viruses. If result is PRESUMPTIVE POSTIVE SARS-CoV-2 nucleic acids MAY BE PRESENT.   A presumptive positive result was obtained on the submitted specimen  and confirmed on repeat testing.  While 2019 novel coronavirus  (SARS-CoV-2) nucleic acids may be present in the submitted sample  additional confirmatory testing may be necessary for epidemiological  and / or clinical management purposes  to differentiate between  SARS-CoV-2 and other Sarbecovirus currently known to infect humans.  If clinically indicated additional testing with an alternate test  methodology [phone]) is advised. The SARS-CoV-2 RNA is generally  detectable in upper and lower respiratory sp ecimens during the acute  phase of infection. The expected result is Negative. Fact Sheet for Patients:  StrictlyIdeas.no Fact Sheet for Healthcare Providers: BankingDealers.co.za This test is not yet approved or cleared by the [place] FDA and has been authorized for detection and/or diagnosis of SARS-CoV-2 by FDA under an Emergency Use Authorization (EUA).  This EUA will remain in effect (meaning this test can be used) for the duration of the COVID-19 declaration under Section 564(b)(1) of the Act, 21 U.S.C. section 360bbb-3(b)(1), unless the authorization is terminated or revoked sooner. Performed at [facility], [place] [address] [place], [place] [address]   Culture, blood (routine x 2)     Status: None (Preliminary result)   Collection Time: 05/15/19  7:13 PM  Result Value Ref Range Status   Specimen Description BLOOD LEFT WRIST  Final   Special Requests   Final    BOTTLES DRAWN AEROBIC AND ANAEROBIC Blood Culture results may not be optimal due to an excessive volume of blood received in culture bottles    Culture   Final    NO GROWTH 4 DAYS Performed at [facility], [place] [address], [place], [place] [address]    Report Status PENDING  Incomplete         Radiology Studies: No results found.      Scheduled Meds: . allopurinol  300 mg Oral Daily  . docusate sodium  100 mg  Oral BID  . ferrous sulfate  325 mg Oral Q breakfast  . furosemide  80 mg Oral BID  . gabapentin  300 mg Oral TID  . metoprolol tartrate  25 mg Oral BID  . mirabegron ER  50 mg Oral Daily  . pantoprazole  40 mg Oral Daily  . potassium chloride SA  20 mEq Oral Daily  . rosuvastatin  40 mg Oral Daily  . sertraline  200 mg Oral Daily  . sodium chloride flush  3 mL Intravenous Once  . spironolactone  25 mg Oral Daily  . tamsulosin  0.4 mg Oral QHS   Continuous Infusions: . ceFEPime (MAXIPIME) IV 2 g (05/19/19 1101)  . lactated ringers Stopped (05/18/19 1517)  . vancomycin 1,000 mg (05/18/19 2109)     LOS: 4 days    Time spent: 25 minutes spent in the coordination of care.     Jonnie Finner, DO [facility] Pager [phone]  If 7PM-7AM, please contact night-coverage [email or web address] Password [email or web address] 05/19/2019, 11:19 AM

## 2019-05-19 NOTE — Progress Notes (Signed)
Physical Therapy Treatment Patient Details Name: [name]. MRN: [id number] DOB: 1941-11-20 Today's Date: 05/19/2019    History of Present Illness Patient is a 78 y/o male who presents with BLE swelling and SOB. Found to have cellulitis with ischemic fourth toe on left foot. Admitted with acute heart failure. EKG- Afib with RBBB. Pt s/p left 4th toe amputation 5/30. PMH includes SDH, pulmonary fibrosis, HTN, HLD, CAD, A-fib.    PT Comments    Pt in bed on arrival reporting feeling dizzy today. [place] BP remained relatively stable despite positional changes and activity. Pt reports feeling like right foot "is not there" he stated it was not numb but very hard to sense and a large part of what created partial buckling with gait today. Pt with continued partial buckling with gait requiring support of RW and physical assist. Will continue to follow with pt educated for cast shoe and progression of activity.    BP in supine with HOB 35 degrees 95/64 (75) with HR 54  transition to standing with bP 81/59 (65), HR 55  After gait BP 86/61 (70), HR 55   Follow Up Recommendations  Home health PT;Supervision for mobility/OOB     Equipment Recommendations  Rolling walker with 5" wheels    Recommendations for Other Services       Precautions / Restrictions Precautions Precautions: Fall Required Braces or Orthoses: Other Brace Other Brace: cast shoe left Restrictions LLE Weight Bearing: Weight bearing as tolerated    Mobility  Bed Mobility Overal bed mobility: Needs Assistance Bed Mobility: Supine to Sit     Supine to sit: Supervision;HOB elevated     General bed mobility comments: supervision for lines and safety to transition from [facility] elevated to pivot to left side of bed  Transfers Overall transfer level: Needs assistance   Transfers: Sit to/from Stand Sit to Stand: Min assist         General transfer comment: cues for hand placement with light min assist to rise  from surface with surface elevated to match home height  Ambulation/Gait Ambulation/Gait assistance: Min assist Gait Distance (Feet): 250 Feet Assistive device: Rolling walker (2 wheeled) Gait Pattern/deviations: Step-through pattern;Decreased stride length     General Gait Details: cues for posture, position in RW and safety as pt with cast shoe on LLE, declined shoe for right foot and demonstrated grossly 6 partial knee buckles during gait. Pt able to recover with each partial buckle but close assist to control RW and prevent fall   Stairs             Wheelchair Mobility    Modified Rankin (Stroke Patients Only)       Balance Overall balance assessment: Needs assistance Sitting-balance support: Feet supported;No upper extremity supported Sitting balance-Leahy Scale: Good     Standing balance support: During functional activity Standing balance-Leahy Scale: Poor Standing balance comment: Requires UE support in standing.                            Cognition Arousal/Alertness: Awake/alert Behavior During Therapy: WFL for tasks assessed/performed Overall Cognitive Status: No family/caregiver present to determine baseline cognitive functioning Area of Impairment: Memory;Problem solving                     Memory: Decreased short-term memory   Safety/Judgement: Decreased awareness of safety;Decreased awareness of deficits            Exercises General  Exercises - Lower Extremity Long Arc Quad: AROM;15 reps;Seated;Both    General Comments        Pertinent Vitals/Pain Pain Assessment: No/denies pain    Home Living                      Prior Function            PT Goals (current goals can now be found in the care plan section) Progress towards PT goals: Progressing toward goals    Frequency    Min 3X/week      PT Plan Current plan remains appropriate    Co-evaluation              AM-PAC PT "6 Clicks"  Mobility   Outcome Measure  Help needed turning from your back to your side while in a flat bed without using bedrails?: A Little Help needed moving from lying on your back to sitting on the side of a flat bed without using bedrails?: A Little Help needed moving to and from a bed to a chair (including a wheelchair)?: A Little Help needed standing up from a chair using your arms (e.g., wheelchair or bedside chair)?: A Little Help needed to walk in hospital room?: A Little Help needed climbing 3-5 steps with a railing? : A Lot 6 Click Score: 17    End of Session Equipment Utilized During Treatment: Gait belt Activity Tolerance: Patient tolerated treatment well Patient left: in chair;with call bell/phone within reach;with chair alarm set Nurse Communication: Mobility status PT Visit Diagnosis: Difficulty in walking, not elsewhere classified (R26.2);Muscle weakness (generalized) (M62.81);Unsteadiness on feet (R26.81);Other abnormalities of gait and mobility (R26.89)     Time: 9509-3267 PT Time Calculation (min) (ACUTE ONLY): 33 min  Charges:  $Gait Training: 8-22 mins $Therapeutic Activity: 8-22 mins                     Nayan Proch Pam Drown, PT Acute Rehabilitation Services Pager: [phone] Office: [place] 05/19/2019, 1:33 PM

## 2019-05-19 NOTE — Progress Notes (Signed)
ABI's have been completed. Preliminary results can be found in CV Proc through chart review.   05/19/19 3:06 PM [name] RVT

## 2019-05-19 NOTE — Progress Notes (Signed)
Orthopedic Tech Progress Note Patient Details:  [name]. [birth year] [id number]  Ortho Devices Type of Ortho Device: Postop shoe/boot Ortho Device/Splint Location: LLE Ortho Device/Splint Interventions: Adjustment, Application, Ordered   Post Interventions Patient Tolerated: Well Instructions Provided: Care of device, Adjustment of device   [name] 05/19/2019, 9:32 AM

## 2019-05-19 NOTE — Progress Notes (Addendum)
HHC choice offered to the patient, he chose [facility]) [place]; Dan with [facility] called for arrangements. Aneta Mins [phone]      [facility] [phone]   [place] out of 5 stars Patient Survey Summary Rating4 out of [place] [phone]   [place] of Patient Care Rating4 out of 5 stars Patient Survey Summary Rating4 out of [place] [phone]   [place]  out of 5 stars Patient Survey Summary Rating3 out of [place] [phone]   [place], Quality of Patient Care Rating4  out of 5 stars Patient Survey Summary Rating4 out of [place] [phone]   [place],  Quality of Patient Care Rating4 out of 5 stars Patient Survey Summary Rating4 out of [place] [phone]   [place] of Patient Care Rating4 out of 5 stars Patient Survey Summary Rating4 out of 5 stars  [facility] [place] [phone]   [place]  out of 5 stars Patient Survey Summary Rating4 out of [place] [phone]   [place] out of 5 stars Patient Survey Summary Rating4 out of 5 stars  [facility] [phone]   [place] out of 5 stars Not Available12  [facility] [phone]   Add [facility]  Quality of Patient Care Rating3  out of 5 stars Patient Survey Summary Rating3 out of [place] [phone]   [place] to my [place] out  of 5 stars Patient Survey Summary Rating4 out of [place] [phone]   [place] to my [place]  out of 5 stars Patient Survey Summary Rating4 out of [place] [phone]   [place] to my [place]  out of 5 stars Patient Survey Summary Rating3 out of [place] ([phone]) [place] to my [place]  out of 5 stars Patient Survey Summary Rating3 out of [place] [phone]

## 2019-05-19 NOTE — Progress Notes (Signed)
Pharmacy Antibiotic Note  [name]. is a 78 y.o. male admitted on 05/15/2019 with cellulitis.  Pharmacy has been consulted for vancomycin and cefepime dosing. 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 D/c IV abx.     Height: 5' 8.5" (174 cm) Weight: 180 lb 12.4 oz (82 kg) IBW/kg (Calculated) : 69.55  Temp (24hrs), Avg:97.9 F (36.6 C), Min:97.6 F (36.4 C), Max:98.1 F (36.7 C)  Recent Labs  Lab 05/15/19 1535 05/15/19 1803 05/16/19 0609 05/17/19 0455 05/18/19 0707 05/19/19 0643  WBC 4.1  --  4.5 5.2 6.3 5.8  CREATININE 1.37*  --  1.29* 1.44* 1.65* 1.69*  LATICACIDVEN  --  0.6  --   --   --   --     Estimated Creatinine Clearance: 36.6 mL/min (A) (by C-G formula based on SCr of 1.69 mg/dL (H)).    Allergies  Allergen Reactions  . Lipitor [Atorvastatin] Other (See Comments)    Stiff joints  . Nsaids Other (See Comments)    unknown  . Warfarin And Related Other (See Comments)    Stiff joints  . Allopurinol Itching    May or may not be allergic to this (If the patient should find out he is NOT allergic, please omit this entry.)  . Enbrel [Etanercept] Itching    May or may not be allergic to this (If the patient should find out he is NOT allergic, please omit this entry.)    Bertis Ruddy, PharmD Clinical Pharmacist Please check AMION for all [place] numbers 05/19/2019 12:20 PM

## 2019-05-20 LAB — CBC WITH DIFFERENTIAL/PLATELET
Abs Immature Granulocytes: 0.01 10*3/uL (ref 0.00–0.07)
Basophils Absolute: 0.1 10*3/uL (ref 0.0–0.1)
Basophils Relative: 1 %
Eosinophils Absolute: 0.2 10*3/uL (ref 0.0–0.5)
Eosinophils Relative: 4 %
HCT: 33.2 % — ABNORMAL LOW (ref 39.0–52.0)
Hemoglobin: 10.3 g/dL — ABNORMAL LOW (ref 13.0–17.0)
Immature Granulocytes: 0 %
Lymphocytes Relative: 19 %
Lymphs Abs: 1 10*3/uL (ref 0.7–4.0)
MCH: 28.4 pg (ref 26.0–34.0)
MCHC: 31 g/dL (ref 30.0–36.0)
MCV: 91.5 fL (ref 80.0–100.0)
Monocytes Absolute: 0.3 10*3/uL (ref 0.1–1.0)
Monocytes Relative: 6 %
Neutro Abs: 3.7 10*3/uL (ref 1.7–7.7)
Neutrophils Relative %: 70 %
Platelets: 97 10*3/uL — ABNORMAL LOW (ref 150–400)
RBC: 3.63 MIL/uL — ABNORMAL LOW (ref 4.22–5.81)
RDW: 15.9 % — ABNORMAL HIGH (ref 11.5–15.5)
WBC: 5.2 10*3/uL (ref 4.0–10.5)
nRBC: 0 % (ref 0.0–0.2)

## 2019-05-20 LAB — RENAL FUNCTION PANEL
Albumin: 3.2 g/dL — ABNORMAL LOW (ref 3.5–5.0)
Anion gap: 7 (ref 5–15)
BUN: 39 mg/dL — ABNORMAL HIGH (ref 8–23)
CO2: 33 mmol/L — ABNORMAL HIGH (ref 22–32)
Calcium: 9.3 mg/dL (ref 8.9–10.3)
Chloride: 101 mmol/L (ref 98–111)
Creatinine, Ser: 1.83 mg/dL — ABNORMAL HIGH (ref 0.61–1.24)
GFR calc Af Amer: 41 mL/min — ABNORMAL LOW
GFR calc non Af Amer: 35 mL/min — ABNORMAL LOW
Glucose, Bld: 94 mg/dL (ref 70–99)
Phosphorus: 4.2 mg/dL (ref 2.5–4.6)
Potassium: 4.6 mmol/L (ref 3.5–5.1)
Sodium: 141 mmol/L (ref 135–145)

## 2019-05-20 LAB — CULTURE, BLOOD (ROUTINE X 2)
Culture: NO GROWTH
Culture: NO GROWTH
Special Requests: ADEQUATE

## 2019-05-20 LAB — URINALYSIS, ROUTINE W REFLEX MICROSCOPIC
Bilirubin Urine: NEGATIVE
Glucose, UA: NEGATIVE mg/dL
Hgb urine dipstick: NEGATIVE
Ketones, ur: NEGATIVE mg/dL
Leukocytes,Ua: NEGATIVE
Nitrite: NEGATIVE
Protein, ur: NEGATIVE mg/dL
Specific Gravity, Urine: 1.011 (ref 1.005–1.030)
pH: 5 (ref 5.0–8.0)

## 2019-05-20 LAB — GLUCOSE, CAPILLARY: Glucose-Capillary: 146 mg/dL — ABNORMAL HIGH (ref 70–99)

## 2019-05-20 LAB — MAGNESIUM: Magnesium: 2.1 mg/dL (ref 1.7–2.4)

## 2019-05-20 MED ORDER — SODIUM CHLORIDE 0.9 % IV SOLN
Freq: Once | INTRAVENOUS | Status: AC
  Administered 2019-05-20: 10:00:00 via INTRAVENOUS

## 2019-05-20 NOTE — Progress Notes (Addendum)
[name]  PROGRESS NOTE    [name].  KDT:[id number] DOB: 09-May-1941 DOA: 05/15/2019 PCP: [name], MD   Brief Narrative:   Jamie Richardsonis a 78 y.o.malewithhistory of CAD status post CABG status post maze procedure, pacemaker placement for bradycardia A. fib, chronic anemia and thrombocytopenia and possible cirrhosis presented to the cardiology office for follow-up while increasing shortness of breath which has been ongoing for last few months. Patient states his shortness of breath further worsened acutely last few days. Has been a increasing lower extremity edema extending up to the thighs. Has noticed increasing erythema and discoloration of the left fourth toe last few days. Has been having frequent falls. Denies hitting his head or losing consciousness. 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  - cards onboard, appreciate assistnace  - Lasix 80 mg IV BID, spironolactone 25mg  qday - repeat echo pending - cards has s/o'd; rec lasix 80mg  BID and batt change on PPM in 4 - 5 weeks.     - decrease metoprolol to 25 mg BID as he is brady and hypotensive     - hypotensive, brady and symptomatic this AM; lasix, metoprolol held, 250cc NS given.. BP improve, symptoms gone     - he is not tolerant of his BB at this time, will hold     - can attempt lasix again in AM at lower dose  Lower extremity cellulitis with possible ischemic fourth toe of the left foot. - ABI pending   - cefepime, vanc; d/c'd. -tells me that his wife has been clipping and removing his toe nail as it was digging into this skin; spoke with ortho, they have reviewed films and will take him to the OR for amputation; see their note; appreciate their assistance - s/p amputation  Cirrhosis of the liver - US ab ordered - LFTs ok  Chronic anemia and thrombocytopenia likely from cirrhosis of the liver - Follow CBC.  History of Lewy body dementia per the chart. - monitor  A. fib - metoprolol rate control.  - Not on anticoagulation secondary history of spontaneous subdural hematoma in 2013.     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 Vanc, cefepime     Subjective: "I don't know, doc. I feel a little funny."  Objective: Vitals:   05/19/19 2144 05/20/19 0646 05/20/19 0848 05/20/19 1135  BP: 97/65 (!) 93/59 (!) 86/53 (!) 127/109  Pulse: (!) 54 (!) 55 (!) 54 (!) 103  Resp:  16 16 16   Temp:  97.8 F (36.6 C) 98.4 F (36.9 C) 98 F (36.7 C)  TempSrc:  Oral Oral Oral  SpO2: 98% 91% 96% 94%  Weight:  83.3 kg    Height:        Intake/Output Summary (Last 24 hours) at 05/20/2019 1439 Last data filed at 05/20/2019 1349 Gross per 24 hour  Intake 1492.71 ml  Output 2825 ml  Net -1332.29 ml   Filed Weights   05/18/19 0537 05/19/19 0500 05/20/19 0646  Weight: 82.6 kg 82 kg 83.3 kg    Examination:  General exam:78 y.o.maleAppears calm and comfortable  Respiratory system: Clear to auscultation. Respiratory effort normal. Cardiovascular system:S1 &S2 heard, brady. No JVD, murmurs, rubs,  gallops or clicks. BLE edema Gastrointestinal system:Abdomen is distended, soft and nontender. No organomegaly or masses felt. Normal bowel sounds heard. Central nervous system:Alert and oriented. No focal neurological deficits. Extremities: Symmetric 5 x 5 power    Data Reviewed: I have personally reviewed following labs and imaging studies.  CBC: Recent Labs  Lab 05/16/19 0609 05/17/19 0455 05/18/19 0707 05/19/19 0643 05/20/19 0609  WBC 4.5 5.2 6.3 5.8 5.2  NEUTROABS 3.3 3.7 5.2 3.7 3.7  HGB 9.5* 9.9* 10.5* 9.6* 10.3*  HCT 28.9* 30.7* 32.3* 30.2* 33.2*  MCV 88.9 88.0 88.5 90.4 91.5  PLT 100* 109* 112* 95* 97*   Basic Metabolic Panel: Recent Labs  Lab 05/16/19 0609 05/17/19 0455 05/18/19 0707 05/19/19 0643 05/20/19 0609  NA 141 139 139 140 141  K 3.4* 3.3* 4.1 4.1 4.6  CL 102 98 98 100 101  CO2 27 28 32 30 33*  GLUCOSE 92 94 118* 96 94  BUN 17 20 28* 32* 39*  CREATININE 1.29* 1.44* 1.65* 1.69* 1.83*  CALCIUM 9.1 9.4 9.3 9.1 9.3  MG 1.6* 1.7 1.8 2.0 2.1  PHOS  --  3.3 3.3 4.0 4.2   GFR: Estimated Creatinine Clearance: 33.8 mL/min (A) (by C-G formula based on SCr of 1.83 mg/dL (H)). Liver Function Tests: Recent Labs  Lab 05/15/19 1746 05/16/19 0609 05/17/19 0455 05/18/19 0707 05/19/19 0643 05/20/19 0609  AST 28 24  --   --   --   --   ALT 16 16  --   --   --   --   ALKPHOS 81 80  --   --   --   --   BILITOT 1.0 1.4*  --   --   --   --   PROT 7.7 7.0  --   --   --   --   ALBUMIN 3.5 3.4* 3.3* 3.3* 3.1* 3.2*   No results for input(s): LIPASE, AMYLASE in the last 168 hours. No results for input(s): AMMONIA in the last 168 hours. Coagulation Profile: No results for input(s): INR, PROTIME in the last 168 hours. Cardiac Enzymes: Recent Labs  Lab 05/15/19 1535  TROPONINI <0.03   BNP (last 3 results) No results for input(s): PROBNP in the last 8760 hours. HbA1C: No results for input(s): HGBA1C in the last 72 hours. CBG: Recent Labs  Lab 05/17/19  1750 05/17/19 2113 05/18/19 0650  GLUCAP 102* 114* 114*   Lipid Profile: No results for input(s): CHOL, HDL, LDLCALC, TRIG, CHOLHDL, LDLDIRECT in the last 72 hours. Thyroid Function Tests: No results for input(s): TSH, T4TOTAL, FREET4, T3FREE, THYROIDAB in the last 72 hours. Anemia Panel: No results for input(s): VITAMINB12, FOLATE, FERRITIN, TIBC, IRON, RETICCTPCT in the last 72 hours. Sepsis Labs: Recent Labs  Lab 05/15/19 1803  LATICACIDVEN 0.6    Recent Results (from the past 240 hour(s))  Culture, blood (routine x  2)     Status: None   Collection Time: 05/15/19  6:13 PM  Result Value Ref Range Status   Specimen Description BLOOD RIGHT ANTECUBITAL  Final   Special Requests   Final    BOTTLES DRAWN AEROBIC AND ANAEROBIC Blood Culture adequate volume   Culture   Final    NO GROWTH 5 DAYS Performed at [facility], [address], [place], [place] [address]    Report Status 05/20/2019 FINAL  Final  SARS Coronavirus 2 (CEPHEID - Performed in [facility]), Hosp Order     Status: None   Collection Time: 05/15/19  6:22 PM  Result Value Ref Range Status   SARS Coronavirus 2 NEGATIVE NEGATIVE Final    Comment: (NOTE) If result is NEGATIVE SARS-CoV-2 target nucleic acids are NOT DETECTED. The SARS-CoV-2 RNA is generally detectable in upper and lower  respiratory specimens during the acute phase of infection. The lowest  concentration of SARS-CoV-2 viral copies this assay can detect is 250  copies / mL. A negative result does not preclude SARS-CoV-2 infection  and should not be used as the sole basis for treatment or other  patient management decisions.  A negative result may occur with  improper specimen collection / handling, submission of specimen other  than nasopharyngeal swab, presence of viral mutation(s) within the  areas targeted by this assay, and inadequate number of viral copies  (<250 copies / mL). A negative result must be combined with  clinical  observations, patient history, and epidemiological information. If result is POSITIVE SARS-CoV-2 target nucleic acids are DETECTED. The SARS-CoV-2 RNA is generally detectable in upper and lower  respiratory specimens dur ing the acute phase of infection.  Positive  results are indicative of active infection with SARS-CoV-2.  Clinical  correlation with patient history and other diagnostic information is  necessary to determine patient infection status.  Positive results do  not rule out bacterial infection or co-infection with other viruses. If result is PRESUMPTIVE POSTIVE SARS-CoV-2 nucleic acids MAY BE PRESENT.   A presumptive positive result was obtained on the submitted specimen  and confirmed on repeat testing.  While 2019 novel coronavirus  (SARS-CoV-2) nucleic acids may be present in the submitted sample  additional confirmatory testing may be necessary for epidemiological  and / or clinical management purposes  to differentiate between  SARS-CoV-2 and other Sarbecovirus currently known to infect humans.  If clinically indicated additional testing with an alternate test  methodology [id number]) is advised. The SARS-CoV-2 RNA is generally  detectable in upper and lower respiratory sp ecimens during the acute  phase of infection. The expected result is Negative. Fact Sheet for Patients:  StrictlyIdeas.no Fact Sheet for Healthcare Providers: BankingDealers.co.za This test is not yet approved or cleared by the [place] FDA and has been authorized for detection and/or diagnosis of SARS-CoV-2 by FDA under an Emergency Use Authorization (EUA).  This EUA will remain in effect (meaning this test can be used) for the duration of the COVID-19 declaration under Section 564(b)(1) of the Act, 21 U.S.C. section 360bbb-3(b)(1), unless the authorization is terminated or revoked sooner. Performed at [facility], [place]  [address], [place], [place] [address]   Culture, blood (routine x 2)     Status: None   Collection Time: 05/15/19  7:13 PM  Result Value Ref Range Status   Specimen Description BLOOD LEFT WRIST  Final   Special Requests   Final    BOTTLES DRAWN AEROBIC  AND ANAEROBIC Blood Culture results may not be optimal due to an excessive volume of blood received in culture bottles   Culture   Final    NO GROWTH 5 DAYS Performed at [facility] [address] [place], [place] [address]    Report Status 05/20/2019 FINAL  Final         Radiology Studies: Vas [place] Abi With/wo Tbi  Result Date: 05/20/2019 LOWER EXTREMITY DOPPLER STUDY Indications: Rest pain, and Ischemic toe.  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 Performing Technologist: [name] RVT  Examination Guidelines: A complete evaluation includes at minimum, Doppler waveform signals and systolic blood pressure reading at the level of bilateral brachial, anterior tibial, and posterior tibial arteries, when vessel segments are accessible. Bilateral testing is considered an integral part of a complete examination. Photoelectric Plethysmograph (PPG) waveforms and toe systolic pressure readings are included as required and additional duplex testing as needed. Limited examinations for reoccurring indications may be performed as noted.  ABI Findings: +---------+------------------+-----+---------+--------+ Right    Rt Pressure (mmHg)IndexWaveform Comment  +---------+------------------+-----+---------+--------+ Brachial 83                     triphasic         +---------+------------------+-----+---------+--------+ PTA      100               1.06 triphasic         +---------+------------------+-----+---------+--------+ DP       102               1.09 triphasic         +---------+------------------+-----+---------+--------+ Great Toe53                0.56                    +---------+------------------+-----+---------+--------+ +---------+------------------+-----+---------+-------+ Left     Lt Pressure (mmHg)IndexWaveform Comment +---------+------------------+-----+---------+-------+ Brachial 94                     triphasic        +---------+------------------+-----+---------+-------+ PTA      115               1.22 triphasic        +---------+------------------+-----+---------+-------+ DP       98                1.04 triphasic        +---------+------------------+-----+---------+-------+ Great Toe57                0.61                  +---------+------------------+-----+---------+-------+ +-------+-----------+-----------+------------+------------+ ABI/TBIToday's ABIToday's TBIPrevious ABIPrevious TBI +-------+-----------+-----------+------------+------------+ Right  1.09       0.56                                +-------+-----------+-----------+------------+------------+ Left   1.22       0.61                                +-------+-----------+-----------+------------+------------+  Summary: Right: Resting right ankle-brachial index is within normal range. No evidence of significant right lower extremity arterial disease. The right toe-brachial index is abnormal. Left: Resting left ankle-brachial index is within normal range. No evidence of significant left lower extremity arterial disease. The  left toe-brachial index is abnormal.  *See table(s) above for measurements and observations.  Electronically signed by Deitra Mayo MD on 05/20/2019 at 12:55:30 PM.   Final         Scheduled Meds: . allopurinol  300 mg Oral Daily  . docusate sodium  100 mg Oral BID  . ferrous sulfate  325 mg Oral Q breakfast  . gabapentin  300 mg Oral TID  . mirabegron ER  50 mg Oral Daily  . pantoprazole  40 mg Oral Daily  . potassium chloride SA  20 mEq Oral Daily  . rosuvastatin  40 mg Oral Daily  . sertraline  200 mg Oral Daily  .  sodium chloride flush  3 mL Intravenous Once  . spironolactone  25 mg Oral Daily  . tamsulosin  0.4 mg Oral QHS   Continuous Infusions: . sodium chloride Stopped (05/19/19 2143)  . lactated ringers Stopped (05/18/19 1517)     LOS: 5 days    Time spent: 25 minutes spent in the coordination of care including family counseling.     Jonnie Finner, DO [facility] Pager [phone]  If 7PM-7AM, please contact night-coverage [email or web address] Password [facility] 05/20/2019, 2:39 PM

## 2019-05-20 NOTE — Plan of Care (Signed)

## 2019-05-20 NOTE — Progress Notes (Signed)
Physical Therapy Treatment Patient Details Name: [name]. MRN: [id number] DOB: 12/03/1941 Today's Date: 05/20/2019    History of Present Illness Patient is a 78 y/o male who presents with BLE swelling and SOB. Found to have cellulitis with ischemic fourth toe on left foot. Admitted with acute heart failure. EKG- Afib with RBBB. Pt s/p left 4th toe amputation 5/30. PMH includes SDH, pulmonary fibrosis, HTN, HLD, CAD, A-fib.    PT Comments    Pt pleasant and eager to get OOB due to sore sacrum. Pt reported initial dizziness with stable BP and HR. Pt with increasingly impaired gait this session with need for min assist at all times during gait and mod cues for RW use and stability. Without wife being present to demonstrate ability to care for pt I am concerned that home may not be sufficient to prevent falls. ST-SNF recommended to maximize pt strength and safety. Pt educated for need for assist at all times and HEP. Will continue to follow to improve gait and balance.     Follow Up Recommendations  Supervision for mobility/OOB;SNF     Equipment Recommendations  Rolling walker with 5" wheels    Recommendations for Other Services       Precautions / Restrictions Precautions Precautions: Fall Other Brace: cast shoe left Restrictions LLE Weight Bearing: Weight bearing as tolerated    Mobility  Bed Mobility   Bed Mobility: Supine to Sit     Supine to sit: Supervision     General bed mobility comments: supervision for lines, HOB 10 degrees to pivot to left side of bed  Transfers Overall transfer level: Needs assistance   Transfers: Sit to/from Stand Sit to Stand: Min assist         General transfer comment: cues for hand placement with light min assist to rise from surface with surface elevated to match home height  Ambulation/Gait Ambulation/Gait assistance: Min assist Gait Distance (Feet): 200 Feet Assistive device: Rolling walker (2 wheeled) Gait  Pattern/deviations: Step-through pattern;Decreased stride length   Gait velocity interpretation: <1.8 ft/sec, indicate of risk for recurrent falls General Gait Details: cues for posture, position in RW and safety as pt with cast shoe on LLE. pt with unsteady gait stepping on RIght sock with left shoe x 1, multiple partial LOB with pt requiring cues and education to turn around as pt unable to self regulate distance particularly coupled with unsteady gait despite pt repeatedly stating "I just feel off like i will fall"   Stairs             Wheelchair Mobility    Modified Rankin (Stroke Patients Only)       Balance Overall balance assessment: Needs assistance Sitting-balance support: Feet supported;No upper extremity supported Sitting balance-Leahy Scale: Good     Standing balance support: During functional activity Standing balance-Leahy Scale: Poor Standing balance comment: Requires UE support in standing.                            Cognition Arousal/Alertness: Awake/alert Behavior During Therapy: WFL for tasks assessed/performed Overall Cognitive Status: No family/caregiver present to determine baseline cognitive functioning Area of Impairment: Memory;Problem solving                 Orientation Level: Disoriented to;Time   Memory: Decreased short-term memory   Safety/Judgement: Decreased awareness of safety;Decreased awareness of deficits   Problem Solving: Slow processing General Comments: pt with decreased safety awareness and  unsteadiness today      Exercises General Exercises - Lower Extremity Long Arc Quad: AROM;Seated;Both;10 reps Hip Flexion/Marching: AROM;10 reps;Seated;Both    General Comments        Pertinent Vitals/Pain Pain Assessment: No/denies pain    Home Living                      Prior Function            PT Goals (current goals can now be found in the care plan section) Progress towards PT goals:  Progressing toward goals    Frequency           PT Plan Discharge plan needs to be updated    Co-evaluation              AM-PAC PT "6 Clicks" Mobility   Outcome Measure  Help needed turning from your back to your side while in a flat bed without using bedrails?: A Little Help needed moving from lying on your back to sitting on the side of a flat bed without using bedrails?: A Little Help needed moving to and from a bed to a chair (including a wheelchair)?: A Little Help needed standing up from a chair using your arms (e.g., wheelchair or bedside chair)?: A Little Help needed to walk in hospital room?: A Little Help needed climbing 3-5 steps with a railing? : A Lot 6 Click Score: 17    End of Session Equipment Utilized During Treatment: Gait belt Activity Tolerance: Patient tolerated treatment well Patient left: in chair;with call bell/phone within reach;with chair alarm set Nurse Communication: Mobility status PT Visit Diagnosis: Difficulty in walking, not elsewhere classified (R26.2);Muscle weakness (generalized) (M62.81);Unsteadiness on feet (R26.81);Other abnormalities of gait and mobility (R26.89)     Time: 5830-9407 PT Time Calculation (min) (ACUTE ONLY): 26 min  Charges:  $Gait Training: 8-22 mins $Therapeutic Activity: 8-22 mins                     [place], PT Acute Rehabilitation Services Pager: [phone] Office: [place] 05/20/2019, 1:26 PM

## 2019-05-20 NOTE — Progress Notes (Signed)
Pt states he will place himself on home CPAP once ready for bed.  RT will continue to monitor.

## 2019-05-21 ENCOUNTER — Telehealth: Payer: Self-pay

## 2019-05-21 ENCOUNTER — Inpatient Hospital Stay (HOSPITAL_COMMUNITY): Payer: Medicare Other

## 2019-05-21 DIAGNOSIS — I6330 Cerebral infarction due to thrombosis of unspecified cerebral artery: Secondary | ICD-10-CM

## 2019-05-21 LAB — RENAL FUNCTION PANEL
Albumin: 3.4 g/dL — ABNORMAL LOW (ref 3.5–5.0)
Anion gap: 10 (ref 5–15)
BUN: 41 mg/dL — ABNORMAL HIGH (ref 8–23)
CO2: 30 mmol/L (ref 22–32)
Calcium: 9.4 mg/dL (ref 8.9–10.3)
Chloride: 102 mmol/L (ref 98–111)
Creatinine, Ser: 1.82 mg/dL — ABNORMAL HIGH (ref 0.61–1.24)
GFR calc Af Amer: 41 mL/min — ABNORMAL LOW (ref >60)
GFR calc non Af Amer: 35 mL/min — ABNORMAL LOW (ref >60)
Glucose, Bld: 96 mg/dL (ref 70–99)
Phosphorus: 3.7 mg/dL (ref 2.5–4.6)
Potassium: 4.4 mmol/L (ref 3.5–5.1)
Sodium: 142 mmol/L (ref 135–145)

## 2019-05-21 LAB — CBC WITH DIFFERENTIAL/PLATELET
Abs Immature Granulocytes: 0.01 10*3/uL (ref 0.00–0.07)
Basophils Absolute: 0 10*3/uL (ref 0.0–0.1)
Basophils Relative: 1 %
Eosinophils Absolute: 0.2 10*3/uL (ref 0.0–0.5)
Eosinophils Relative: 4 %
HCT: 34.4 % — ABNORMAL LOW (ref 39.0–52.0)
Hemoglobin: 10.8 g/dL — ABNORMAL LOW (ref 13.0–17.0)
Immature Granulocytes: 0 %
Lymphocytes Relative: 21 %
Lymphs Abs: 1 10*3/uL (ref 0.7–4.0)
MCH: 28.6 pg (ref 26.0–34.0)
MCHC: 31.4 g/dL (ref 30.0–36.0)
MCV: 91.2 fL (ref 80.0–100.0)
Monocytes Absolute: 0.3 10*3/uL (ref 0.1–1.0)
Monocytes Relative: 6 %
Neutro Abs: 3.3 10*3/uL (ref 1.7–7.7)
Neutrophils Relative %: 68 %
Platelets: 90 10*3/uL — ABNORMAL LOW (ref 150–400)
RBC: 3.77 MIL/uL — ABNORMAL LOW (ref 4.22–5.81)
RDW: 16 % — ABNORMAL HIGH (ref 11.5–15.5)
WBC: 4.9 10*3/uL (ref 4.0–10.5)
nRBC: 0 % (ref 0.0–0.2)

## 2019-05-21 LAB — MAGNESIUM: Magnesium: 2.2 mg/dL (ref 1.7–2.4)

## 2019-05-21 LAB — HEMOGLOBIN A1C
Hgb A1c MFr Bld: 5.6 % (ref 4.8–5.6)
Mean Plasma Glucose: 114.02 mg/dL

## 2019-05-21 LAB — PROTIME-INR
INR: 1.3 — ABNORMAL HIGH (ref 0.8–1.2)
Prothrombin Time: 16.1 seconds — ABNORMAL HIGH (ref 11.4–15.2)

## 2019-05-21 MED ORDER — ASPIRIN 81 MG PO CHEW: 81.0000 mg | CHEWABLE_TABLET | Freq: Every day | ORAL | Status: DC

## 2019-05-21 NOTE — Progress Notes (Signed)
Received call from MD reported went in see patient he is c/o of double vision he is concerned for possible CVA reported patient reported symtoms started yesterday so he was ordering a MRI to rule out CVA. Writer informed xray she reported would work patient in for scan stated he had pacemaker would have to check if he needed CT scan instead due to pacemaker.  Charge nurse aware of patient c/o. No further change in status noted.

## 2019-05-21 NOTE — Progress Notes (Signed)
**Note De-Identified vi Obfusction** PROGRESS NOTE    Ein Di Kindle.  POE:[id number] DOB: 1941/12/09 DO: 05/15/2019 PCP: Delnd Pretty, MD   Brief Nrrtive:  Conch Pyo Jr.is  78 y.o.mlewithhistory of CD sttus post CBG sttus post mze procedure, pcemker plcement for brdycrdi . fib, chronic nemi nd thrombocytopeni nd possible cirrhosis presented to the crdiology office for follow-up while incresing shortness of breth which hs been ongoing for lst few months. Ptient sttes his shortness of breth further worsened cutely lst few dys. Hs been  incresing lower extremity edem extending up to the thighs. Hs noticed incresing erythem nd discolortion of the left fourth toe lst few dys. Hs been hving frequent flls. Denies hitting his hed or losing consciousness. 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  Exm with txi worse on R, double vision which ws subjectively worse on L eye.  Initilly plnned for MRI/MR, but pt with pcemker which is not MRI comptible.   Hed CT Neurology c/s, pprecite recs  cute right hert filure  Echo from 5/30 with EF 50-55%, septl hypokinesis, RV not well visulized, cvity modertely enlrged (mildly reduced function of RV) - see report Crdiology c/s - signed off on 5/30, recommended d/c on lsix 80 mg BID Currently metoprolol, lsix, nd spironolctone on hold given hypotension/brdycrdi (noted 6/2 M) Net negtive 7.5 L I/O, dily weights ppers euvolemic t this time, will  continue to monitor  Lower extremity cellulitis with possible ischemic fourth toe of the left foot. S/p L 4th toe mputtion through the proximl phlnx on 5/30 bx d/c'd on 6/1 BI's wnl PT currently recommending SNF  Cirrhosis of the liver No scites by Kore Follow INR.  He does hve low pltelets.  lbumin mildly low, bili mildly elevted. 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 Continue to monitor off diuretics  History of Lewy body dementi per the chrt. Delirium prec  . fib Metoprolol on hold Not on nticogultion secondry history of spontneous subdurl hemtom in 2013.  Sleep pne -on CPPt night  Deconditioning - PT evl; likely relted to HF - rec SNF  DVT prophylxis: SCD Code Sttus: full  Fmily Communiction: none t bedside Disposition Pln: pending further evl of diplopi, improvement in BP  Consultnts:   Neurology  Crdiology  orthopedics  Procedures:  L toe mputtion 5/30 Echo IMPRESSIONS    1. The left ventricle hs low norml systolic function, with n ejection frction of 50-55%. The cvity size ws norml. Left ventriculr distolic Doppler prmeters re indeterminte.  2. LVEF is pproximtely 50 to 55% with septl hypokinesis. Poor.  3. The right ventricle ws not well visulized. The cvity ws modertely enlrged. There is no increse in right ventriculr wll thickness.  4. RV is difficult to visulize, even with Definity It ppers dilted nd function is t lest  mildly reduced.  5. The mitral valve is abnormal. Mild thickening of the mitral valve leaflet.  6. The tricuspid valve is grossly normal. Tricuspid valve regurgitation is severe.  7. The aortic valve was not well  visualized. Mild thickening of the aortic valve. Mild calcification of the aortic valve.  8. The inferior vena cava was dilated in size with <50% respiratory variability.  9. The interatrial septum was not assessed.  Antimicrobials:  Anti-infectives (From admission, onward)   Start     Dose/Rate Route Frequency Ordered Stop   05/19/19 2030  vancomycin (VANCOCIN) IVPB 750 mg/150 ml premix  Status:  Discontinued     750 mg 150 mL/hr over 60 Minutes Intravenous Every 24 hours 05/19/19 1224 05/20/19 0556   05/16/19 2030  vancomycin (VANCOCIN) IVPB 1000 mg/200 mL premix  Status:  Discontinued     1,000 mg 200 mL/hr over 60 Minutes Intravenous Every 24 hours 05/15/19 2005 05/19/19 1224   05/16/19 2000  ceFEPIme (MAXIPIME) 2 g in sodium chloride 0.9 % 100 mL IVPB  Status:  Discontinued     2 g 200 mL/hr over 30 Minutes Intravenous Every 12 hours 05/16/19 0610 05/20/19 0557   05/16/19 0615  ceFEPIme (MAXIPIME) 2 g in sodium chloride 0.9 % 100 mL IVPB     2 g 200 mL/hr over 30 Minutes Intravenous  Once 05/16/19 0610 05/16/19 1024   05/15/19 2015  vancomycin (VANCOCIN) IVPB 750 mg/150 ml premix     750 mg 150 mL/hr over 60 Minutes Intravenous NOW 05/15/19 2002 05/16/19 0016   05/15/19 1815  vancomycin (VANCOCIN) IVPB 1000 mg/200 mL premix     1,000 mg 200 mL/hr over 60 Minutes Intravenous  Once 05/15/19 1802 05/15/19 2011     Subjective: Describes double vision Started yesterday. Come and gone. Worse on L. No CP, SOB.  Can't say exactly what time, yesterday afternoon.  Objective: Vitals:   05/20/19 2208 05/20/19 2334 05/21/19 0334 05/21/19 0758  BP: 108/74 112/76 108/82 94/67  Pulse: 96 95 98 (!) 57  Resp:  16 16 16   Temp:  98 F (36.7 C) 98.2 F (36.8 C) 97.6 F (36.4 C)  TempSrc:  Oral Oral Oral  SpO2: 100% 100% 100% 100%  Weight:   85.7 kg   Height:        Intake/Output Summary (Last 24 hours) at 05/21/2019 1217 Last data filed at 05/21/2019 0841 Gross per 24 hour  Intake  1560 ml  Output 1500 ml  Net 60 ml   Filed Weights   05/19/19 0500 05/20/19 0646 05/21/19 0334  Weight: 82 kg 83.3 kg 85.7 kg    Examination:  General exam: Appears calm and comfortable  Respiratory system: Clear to auscultation. Respiratory effort normal. Cardiovascular system: S1 & S2 heard, RRR.  Gastrointestinal system: Abdomen is nondistended, soft and nontender. Central nervous system: Alert and oriented.  Pt with double vision, he reports worse on L eye.  Visual fields intact.  CN 2-12 otherwise normal.  FNF slow bilaterally, slower and with ataxia noted on R side.  Strength symmetric. Extremities: no LEE Skin: No rashes, lesions or ulcers Psychiatry: Judgement and insight appear normal. Mood & affect appropriate.     Data Reviewed: I have personally reviewed following labs and imaging studies  CBC: Recent Labs  Lab 05/17/19 0455 05/18/19 0707 05/19/19 0643 05/20/19 0609 05/21/19 0433  WBC 5.2 6.3 5.8 5.2 4.9  NEUTROABS 3.7 5.2 3.7 3.7 3.3  HGB 9.9* 10.5* 9.6* 10.3* 10.8*  HCT 30.7*  32.3* 30.2* 33.2* 34.4*  MCV 88.0 88.5 90.4 91.5 91.2  PLT 109* 112* 95* 97* 90*   Basic Metabolic Panel: Recent Labs  Lab 05/17/19 0455 05/18/19 0707 05/19/19 0643 05/20/19 0609 05/21/19 0433  N 139 139 140 141 142  K 3.3* 4.1 4.1 4.6 4.4  CL 98 98 100 101 102  CO2 28 32 30 33* 30  GLUCOSE 94 118* 96 94 96  BUN 20 28* 32* 39* 41*  CRETININE 1.44* 1.65* 1.69* 1.83* 1.82*  CLCIUM 9.4 9.3 9.1 9.3 9.4  MG 1.7 1.8 2.0 2.1 2.2  PHOS 3.3 3.3 4.0 4.2 3.7   GFR: Estimated Creatinine Clearance: 37.1 mL/min () (by C-G formula based on SCr of 1.82 mg/dL (H)). Liver Function Tests: Recent Labs  Lab 05/15/19 1746 05/16/19 0609 05/17/19 0455 05/18/19 0707 05/19/19 0643 05/20/19 0609 05/21/19 0433  ST 28 24  --   --   --   --   --   LT 16 16  --   --   --   --   --   LKPHOS 81 80  --   --   --   --   --   BILITOT 1.0 1.4*  --   --   --   --   --   PROT 7.7 7.0  --    --   --   --   --   LBUMIN 3.5 3.4* 3.3* 3.3* 3.1* 3.2* 3.4*   No results for input(s): LIPSE, MYLSE in the last 168 hours. No results for input(s): MMONI in the last 168 hours. Coagulation Profile: No results for input(s): INR, PROTIME in the last 168 hours. Cardiac Enzymes: Recent Labs  Lab 05/15/19 1535  TROPONINI <0.03   BNP (last 3 results) No results for input(s): PROBNP in the last 8760 hours. Hb1C: No results for input(s): HGB1C in the last 72 hours. CBG: Recent Labs  Lab 05/17/19 1750 05/17/19 2113 05/18/19 0650 05/20/19 2121  GLUCP 102* 114* 114* 146*   Lipid Profile: No results for input(s): CHOL, HDL, LDLCLC, TRIG, CHOLHDL, LDLDIRECT in the last 72 hours. Thyroid Function Tests: No results for input(s): TSH, T4TOTL, FREET4, T3FREE, THYROIDB in the last 72 hours. nemia Panel: No results for input(s): VITMINB12, FOLTE, FERRITIN, TIBC, IRON, RETICCTPCT in the last 72 hours. Sepsis Labs: Recent Labs  Lab 05/15/19 1803  LTICCIDVEN 0.6    Recent Results (from the past 240 hour(s))  Culture, blood (routine x 2)     Status: None   Collection Time: 05/15/19  6:13 PM  Result Value Ref Range Status   Specimen Description BLOOD RIGHT NTECUBITL  Final   Special Requests   Final    BOTTLES DRWN EROBIC ND NEROBIC Blood Culture adequate volume   Culture   Final    NO GROWTH 5 DYS Performed at [facility], [address] [place], [place] [address]    Report Status 05/20/2019 FINL  Final  SRS Coronavirus 2 (CEPHEID - Performed in [facility]), Hosp Order     Status: None   Collection Time: 05/15/19  6:22 PM  Result Value Ref Range Status   SRS Coronavirus 2 NEGTIVE NEGTIVE Final    Comment: (NOTE) If result is NEGTIVE SRS-CoV-2 target nucleic acids are NOT DETECTED. The SRS-CoV-2 RN is generally detectable in upper and lower  respiratory specimens during the acute phase of infection. The lowest  concentration  of SRS-CoV-2 viral copies this assay can detect is 250 **Note De-Identified vi Obfusction** copies / mL.  negtive result does not preclude SRS-CoV-2 infection  nd should not be used s the sole bsis for tretment or other  ptient mngement decisions.   negtive result my occur with  improper specimen collection / hndling, submission of specimen other  thn nsophryngel swb, presence of virl muttion(s) within the  res trgeted by this ssy, nd indequte number of virl copies  (<250 copies / mL).  negtive result must be combined with clinicl  observtions, ptient history, nd epidemiologicl informtion. If result is POSITIVE SRS-CoV-2 trget nucleic cids re DETECTED. The SRS-CoV-2 RN is generlly detectble in upper nd lower  respirtory specimens dur ing the cute phse of infection.  Positive  results re indictive of ctive infection with SRS-CoV-2.  Clinicl  correltion with ptient history nd other dignostic informtion is  necessry to determine ptient infection sttus.  Positive results do  not rule out bcteril infection or co-infection with other viruses. If result is PRESUMPTIVE POSTIVE SRS-CoV-2 nucleic cids MY BE PRESENT.    presumptive positive result ws obtined on the submitted specimen  nd confirmed on repet testing.  While 2019 novel coronvirus  (SRS-CoV-2) nucleic cids my be present in the submitted smple  dditionl confirmtory testing my be necessry for epidemiologicl  nd / or clinicl mngement purposes  to differentite between  SRS-CoV-2 nd other Srbecovirus currently known to infect humns.  If cliniclly indicted dditionl testing with n lternte test  methodology [phone]) is dvised. The SRS-CoV-2 RN is generlly  detectble in upper nd lower respirtory sp ecimens during the cute  phse of infection. The expected result is Negtive. Fct Sheet for Ptients:  StrictlyIdes.no Fct Sheet for [place]  Providers: BnkingDelers.co.z This test is not yet pproved or clered by the [place] [facility] nd hs been uthorized for detection nd/or dignosis of SRS-CoV-2 by FD under n Emergency Use uthoriztion (EU).  This EU will remin in effect (mening this test cn be used) for the durtion of the COVID-19 declrtion under Section 564(b)(1) of the [place], 21 U.S.C. section 360bbb-3(b)(1), unless the uthoriztion is terminted or revoked sooner. Performed t [facility], [place] [address], [place], [place] [address]   Culture, blood (routine x 2)     Sttus: None   Collection Time: 05/15/19  7:13 PM  Result Vlue Ref Rnge Sttus   Specimen Description BLOOD LEFT WRIST  Finl   Specil Requests   Finl    BOTTLES DRWN EROBIC ND NEROBIC Blood Culture results my not be optiml due to n excessive volume of blood received in culture bottles   Culture   Finl    NO GROWTH 5 DYS Performed t [facility], [place] [address], [place], [place] [address]    Report Sttus 05/20/2019 FINL  Finl         Rdiology Studies: Vs Kore bi With/wo Tbi  Result Dte: 05/20/2019 LOWER EXTREMITY DOPPLER STUDY Indictions: Rest pin, nd Ischemic toe.  Vsculr Interventions: 05/17/2019 - Left 4th toe mputtion. Performing Technologist: Crlos Levering RVT  Exmintion Guidelines:  complete evlution includes t minimum, Doppler wveform signls nd systolic blood pressure reding t the level of bilterl brchil, nterior tibil, nd posterior tibil rteries, when vessel segments re ccessible. Bilterl testing is considered n integrl prt of  complete exmintion. Photoelectric Plethysmogrph (PPG) wveforms nd toe systolic pressure redings re included s required nd dditionl duplex testing s needed. Limited exmintions for reoccurring indictions my be performed s noted.  BI Findings: +---------+------------------+-----+---------+--------+ Right  Rt Pressure (mmHg)IndexWaveform Comment  +---------+------------------+-----+---------+--------+ Brachial 83                     triphasic         +---------+------------------+-----+---------+--------+ PTA      100               1.06 triphasic         +---------+------------------+-----+---------+--------+ DP       102               1.09 triphasic         +---------+------------------+-----+---------+--------+ Great Toe53                0.56                   +---------+------------------+-----+---------+--------+ +---------+------------------+-----+---------+-------+ Left     Lt Pressure (mmHg)IndexWaveform Comment +---------+------------------+-----+---------+-------+ Brachial 94                     triphasic        +---------+------------------+-----+---------+-------+ PTA      115               1.22 triphasic        +---------+------------------+-----+---------+-------+ DP       98                1.04 triphasic        +---------+------------------+-----+---------+-------+ Great Toe57                0.61                  +---------+------------------+-----+---------+-------+ +-------+-----------+-----------+------------+------------+ ABI/TBIToday's ABIToday's TBIPrevious ABIPrevious TBI +-------+-----------+-----------+------------+------------+ Right  1.09       0.56                                +-------+-----------+-----------+------------+------------+ Left   1.22       0.61                                +-------+-----------+-----------+------------+------------+  Summary: Right: Resting right ankle-brachial index is within normal range. No evidence of significant right lower extremity arterial disease. The right toe-brachial index is abnormal. Left: Resting left ankle-brachial index is within normal range. No evidence of significant left lower extremity arterial disease. The left toe-brachial index is abnormal.  *See table(s)  above for measurements and observations.  Electronically signed by Deitra Mayo MD on 05/20/2019 at 12:55:30 PM.   Final         Scheduled Meds: . allopurinol  300 mg Oral Daily  . docusate sodium  100 mg Oral BID  . ferrous sulfate  325 mg Oral Q breakfast  . gabapentin  300 mg Oral TID  . mirabegron ER  50 mg Oral Daily  . pantoprazole  40 mg Oral Daily  . potassium chloride SA  20 mEq Oral Daily  . rosuvastatin  40 mg Oral Daily  . sertraline  200 mg Oral Daily  . sodium chloride flush  3 mL Intravenous Once  . tamsulosin  0.4 mg Oral QHS   Continuous Infusions: . sodium chloride 75 mL/hr at 05/20/19 1722  . lactated ringers 10 mL/hr at 05/20/19 2209     LOS: 6 days    Time spent: over 30 min    Fayrene Helper, MD [facility] Pager AMION  If 7PM-7AM, please contact night-coverage [email or web address] Password [facility] 05/21/2019, 12:17 PM

## 2019-05-21 NOTE — Evaluation (Signed)
Occupational Therapy Evaluation Patient Details Name: [name]. MRN: [id number] DOB: 23-Jun-1941 Today's Date: 05/21/2019    History of Present Illness Patient is a 78 y/o male who presents with BLE swelling and SOB. Found to have cellulitis with ischemic fourth toe on left foot. Admitted with acute heart failure. EKG- Afib with RBBB. Pt s/p left 4th toe amputation 5/30. PMH includes SDH, pulmonary fibrosis, HTN, HLD, CAD, A-fib.   Clinical Impression   Pt was modified independent in self care and ambulated with a cane PTA. He report many falls. Pt is a pleasant man, eager to work with therapy. Presents with generalized weakness, impaired standing balance and decreased cognition which may be baseline. Pt requires set up to moderate assistance for ADL. Will follow acutely. Recommending further rehab in SNF.    Follow Up Recommendations  SNF;Supervision/Assistance - 24 hour    Equipment Recommendations  Other (comment)(defer to next venue)    Recommendations for Other Services       Precautions / Restrictions Precautions Precautions: Fall Required Braces or Orthoses: Other Brace Other Brace: post op shoe--L Restrictions Weight Bearing Restrictions: No LLE Weight Bearing: Weight bearing as tolerated      Mobility Bed Mobility Overal bed mobility: Needs Assistance Bed Mobility: Supine to Sit     Supine to sit: Min guard     General bed mobility comments: increased time  Transfers Overall transfer level: Needs assistance Equipment used: Rolling walker (2 wheeled) Transfers: Sit to/from Stand Sit to Stand: Min assist         General transfer comment: cues for hand placement with light min assist to rise and steady    Balance Overall balance assessment: Needs assistance   Sitting balance-Leahy Scale: Good     Standing balance support: During functional activity Standing balance-Leahy Scale: Poor Standing balance comment: Requires at least one UE support in  standing.                           ADL either performed or assessed with clinical judgement   ADL Overall ADL's : Needs assistance/impaired Eating/Feeding: Independent;Sitting   Grooming: Wash/dry hands;Wash/dry face;Sitting;Set up   Upper Body Bathing: Minimal assistance;Sitting   Lower Body Bathing: Moderate assistance;Sit to/from stand   Upper Body Dressing : Minimal assistance;Sitting   Lower Body Dressing: Moderate assistance;Sit to/from stand   Toilet Transfer: Minimal assistance;Ambulation;RW   Toileting- Clothing Manipulation and Hygiene: Minimal assistance;Sit to/from stand       Functional mobility during ADLs: Minimal assistance;Rolling walker       Vision Baseline Vision/History: Wears glasses Wears Glasses: Reading only Patient Visual Report: Diplopia(intermittent)       Perception     Praxis      Pertinent Vitals/Pain Pain Assessment: No/denies pain     Hand Dominance Right   Extremity/Trunk Assessment Upper Extremity Assessment Upper Extremity Assessment: Overall WFL for tasks assessed   Lower Extremity Assessment Lower Extremity Assessment: Defer to PT evaluation   Cervical / Trunk Assessment Cervical / Trunk Assessment: Kyphotic   Communication Communication Communication: HOH   Cognition Arousal/Alertness: Awake/alert Behavior During Therapy: WFL for tasks assessed/performed Overall Cognitive Status: No family/caregiver present to determine baseline cognitive functioning Area of Impairment: Memory;Problem solving                     Memory: Decreased short-term memory       Problem Solving: Slow processing General Comments: decreased awareness of balance deficits  General Comments       Exercises     Shoulder Instructions      Home Living Family/patient expects to be discharged to:: Private residence Living Arrangements: Spouse/significant other Available Help at Discharge: Family;Available 24  hours/day Type of Home: House Home Access: Stairs to enter [employer] of Steps: 3 Entrance Stairs-Rails: Right;Left;Can reach both Home Layout: One level     Bathroom Shower/Tub: Walk-in shower;Door   [employer] Toilet: Standard     Home Equipment: Environmental consultant - 2 wheels;Wheelchair - [employer];Shower seat - built in;Cane - single point          Prior Functioning/Environment Level of Independence: Independent with assistive device(s)        Comments: Uses SPC for ambulation. Uses RW outside. Lots of falls at home, "hunny, I cannot even count that high."  Drives. Reports working in the yard.        OT Problem List: Decreased strength;Decreased activity tolerance;Impaired balance (sitting and/or standing);Impaired vision/perception;Decreased coordination;Decreased cognition;Decreased knowledge of use of DME or AE      OT Treatment/Interventions: Self-care/ADL training;DME and/or AE instruction;Patient/family education;Balance training;Cognitive remediation/compensation;Therapeutic activities    OT Goals(Current goals can be found in the care plan section) Acute Rehab OT Goals Patient Stated Goal: to go home OT Goal Formulation: With patient Time For Goal Achievement: 06/04/19 Potential to Achieve Goals: Good ADL Goals Pt Will Perform Grooming: with supervision;standing Pt Will Perform Lower Body Bathing: with supervision;sit to/from stand Pt Will Perform Lower Body Dressing: with supervision;sit to/from stand Pt Will Transfer to Toilet: with supervision;ambulating;bedside commode Pt Will Perform Toileting - Clothing Manipulation and hygiene: with supervision;sit to/from stand  OT Frequency: Min 2X/week   Barriers to D/C:            Co-evaluation              AM-PAC OT "6 Clicks" Daily Activity     Outcome Measure Help from another person eating meals?: None Help from another person taking care of personal grooming?: A Little Help from  another person toileting, which includes using toliet, bedpan, or urinal?: A Little Help from another person bathing (including washing, rinsing, drying)?: A Lot Help from another person to put on and taking off regular upper body clothing?: A Little Help from another person to put on and taking off regular lower body clothing?: A Lot 6 Click Score: 17   End of Session Equipment Utilized During Treatment: Gait belt;Rolling walker  Activity Tolerance: Patient tolerated treatment well Patient left: in chair;with call bell/phone within reach;with chair alarm set  OT Visit Diagnosis: Unsteadiness on feet (R26.81);Other abnormalities of gait and mobility (R26.89);Muscle weakness (generalized) (M62.81);Other symptoms and signs involving cognitive function;History of falling (Z91.81);Other (comment)(intermittent diplopia)                Time: 9735-3299 OT Time Calculation (min): 28 min Charges:  OT General Charges $OT Visit: 1 Visit OT Evaluation $OT Eval Moderate Complexity: 1 Mod OT Treatments $Self Care/Home Management : 8-22 mins  Malka So 05/21/2019, 4:16 PM  Nestor Lewandowsky, OTR/L Acute Rehabilitation Services Pager: [phone] Office: [phone]

## 2019-05-21 NOTE — Progress Notes (Signed)
Pt has home CPAP. States he does not need any assistance.

## 2019-05-21 NOTE — Consult Note (Addendum)
Neurology Consultation  Reason for Consult: Possible stroke Referring Physician: Florene Glen  CC: Double vision  History is obtained from: Patient  HPI: [name]. is a 78 y.o. male with history of subdural hematoma, pulmonary fibrosis, peripheral neuropathy, pacemaker, obstructive sleep apnea, hypertension, hyperlipidemia, diabetes, CAD, atrial fibrillation.  Patient was initially admitted secondary to shortness of breath.  Patient is also had increasing lower extremity edema extending up to his thighs.  While in the ED x-ray of the foot did not show any acute changes in the bone structure of the left toe which looked necrotic.  This morning patient stated that when the practitioner came into his room and he was looking to the left he felt as though he was seeing 2 physicians side-by-side.  At this time patient does not have any diplopia.  He also has normal vision.  He denies any headache, vertigo, off balance.  He denies any other symptoms such as weakness of his lower extremities or upper extremities and/or decreased sensation other than his peripheral neuropathy.    LKW: 2300 hrs. on 05/20/2019 tpa given?: no, symptoms resolved Premorbid modified Rankin scale (mRS): 0 NIH stroke score of 0   ROS: A 14 point ROS was performed and is negative except as noted in the HPI.   Past Medical History:  Diagnosis Date  . (HFpEF) heart failure with preserved ejection fraction ([place])    a. 05/2013 Echo: EF 55%, mild LVH, diast dysfxn, Ao sclerosis, mildly dil LA, RV dysfxn (poorly visualized), PASP 23mmHg;  b. 03/2017 Echo: EF 55-60%, no rwma, triv MR, mildly dil RV, mod TR, PASP 41mmHg.  . Atrial fibrillation [facility])    s/p [facility] 1/09;  Multaq Rx d/c'd in 2014 due to pulmo fibrosis;  coumadin d/c'd in 2014 due to spontaneous subdural hematoma  . BPH (benign prostatic hyperplasia)   . CAD (coronary artery disease), native coronary artery    a. s/p CABG 12/2007;  b. Myoview 12/2011: EF 66%, no scar  or ischemia; normal.  . DM (diabetes mellitus) ([place])   . Hyperlipidemia type II   . Hypertension   . 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 OSA (obstructive sleep apnea)   . Pacemaker    PPM - [employer]  . Peripheral neuropathy 07/31/2018  . Pulmonary fibrosis ([place])    Multaq d/c'd 7/14  . Subdural hematoma ([place]) 07/2012   spontaneous;  coumadin d/c'd => no longer a candidate for anticoagulation    Family History  Problem Relation Age of Onset  . Heart disease Father   . Heart attack Father   . Heart failure Father   . Heart disease Mother   . Alzheimer's disease Mother   . Dementia Mother     Social History:   reports that he quit smoking about 28 years ago. He has never used smokeless tobacco. He reports that he does not drink alcohol or use drugs.  Medications  Current Facility-Administered Medications:  .  0.9 %  sodium chloride infusion, , Intravenous, PRN, Marylyn Ishihara, Tyrone A, DO, Last Rate: 75 mL/hr at 05/20/19 1722 .  acetaminophen (TYLENOL) tablet 650 mg, 650 mg, Oral, Q6H PRN, 650 mg at 05/17/19 0730 **OR** acetaminophen (TYLENOL) suppository 650 mg, 650 mg, Rectal, Q6H PRN, Meredith Pel, MD .  allopurinol (ZYLOPRIM) tablet 300 mg, 300 mg, Oral, Daily, Marlou Sa Tonna Corner, MD, 300 mg at 05/21/19 0752 .  docusate sodium (COLACE) capsule 100 mg, 100 mg, Oral, BID, Marlou Sa,  Tonna Corner, MD, 100 mg at 05/21/19 0751 .  ferrous sulfate tablet 325 mg, 325 mg, Oral, Q breakfast, Marlou Sa Tonna Corner, MD, 325 mg at 05/21/19 0749 .  gabapentin (NEURONTIN) capsule 300 mg, 300 mg, Oral, TID, Marlou Sa Tonna Corner, MD, 300 mg at 05/21/19 0753 .  HYDROcodone-acetaminophen (NORCO/VICODIN) 5-325 MG per tablet 1-2 tablet, 1-2 tablet, Oral, Q4H PRN, Marlou Sa Tonna Corner, MD, 2 tablet at 05/19/19 0238 .  lactated ringers infusion, , Intravenous, Continuous, Marlou Sa Tonna Corner, MD, Last Rate: 10 mL/hr at 05/20/19 2209 .  metoCLOPramide (REGLAN) tablet 5-10  mg, 5-10 mg, Oral, Q8H PRN **OR** metoCLOPramide (REGLAN) injection 5-10 mg, 5-10 mg, Intravenous, Q8H PRN, Meredith Pel, MD .  mirabegron [facility]) tablet 50 mg, 50 mg, Oral, Daily, Marlou Sa, Tonna Corner, MD, 50 mg at 05/21/19 0750 .  ondansetron (ZOFRAN) tablet 4 mg, 4 mg, Oral, Q6H PRN **OR** ondansetron (ZOFRAN) injection 4 mg, 4 mg, Intravenous, Q6H PRN, Meredith Pel, MD .  pantoprazole (PROTONIX) EC tablet 40 mg, 40 mg, Oral, Daily, Marlou Sa Tonna Corner, MD, 40 mg at 05/21/19 0750 .  potassium chloride SA (K-DUR) CR tablet 20 mEq, 20 mEq, Oral, Daily, Marlou Sa, Tonna Corner, MD, 20 mEq at 05/21/19 0749 .  rosuvastatin (CRESTOR) tablet 40 mg, 40 mg, Oral, Daily, Marlou Sa Tonna Corner, MD, 40 mg at 05/21/19 0750 .  sertraline (ZOLOFT) tablet 200 mg, 200 mg, Oral, Daily, Marlou Sa, Tonna Corner, MD, 200 mg at 05/21/19 0752 .  sodium chloride flush (NS) 0.9 % injection 3 mL, 3 mL, Intravenous, Once, Marlou Sa, Tonna Corner, MD .  tamsulosin [facility]) capsule 0.4 mg, 0.4 mg, Oral, QHS, Marlou Sa Tonna Corner, MD, 0.4 mg at 05/20/19 2127   Exam: Current vital signs: BP 94/67 (BP Location: Left Arm)   Pulse (!) 57   Temp 97.6 F (36.4 C) (Oral)   Resp 16   Ht 5' 8.5" (1.74 m)   Wt 85.7 kg   SpO2 100%   BMI 28.32 kg/m  Vital signs in last 24 hours: Temp:  [97.6 F (36.4 C)-98.3 F (36.8 C)] 97.6 F (36.4 C) (06/03 0758) Pulse Rate:  [57-102] 57 (06/03 0758) Resp:  [16-20] 16 (06/03 0758) BP: (94-112)/(67-82) 94/67 (06/03 0758) SpO2:  [96 %-100 %] 100 % (06/03 0758) Weight:  [85.7 kg] 85.7 kg (06/03 0334)  Physical Exam  Constitutional: Appears well-developed and well-nourished.  Psych: Affect appropriate to situation Eyes: No scleral injection HENT: No OP obstrucion Head: Normocephalic.  Cardiovascular: Paced Respiratory: Effort normal, non-labored breathing GI: Soft.  No distension. There is no tenderness.  Skin: WDI  Neuro: Mental Status: Patient is awake, alert,  oriented to person, place, month, year, and situation. Patient is able to give a clear and coherent history. No signs of aphasia or neglect Cranial Nerves: II: Visual Fields are full.  III,IV, VI: EOMI without ptosis or diploplia. Pupils equal, round and reactive to light, on exam patient has significant nystagmus looking to both lateral sides in addition when looking up he has vertical nystagmus and when looking straight at me he has minor vertical nystagmus. V: Facial sensation is symmetric to temperature VII: Facial movement is symmetric.  VIII: hearing is intact to voice X: Palat elevates symmetrically XI: Shoulder shrug is symmetric. XII: tongue is midline without atrophy or fasciculations.  Motor: Tone is normal. Bulk is normal. 5/5 strength was present in all four extremities.  Sensory: Sensation is symmetric to light touch and temperature in the arms and legs.  He does endorse  peripheral neuropathy up to just below his knee Deep Tendon Reflexes: 1+ in the upper extremities no knee jerk Plantars: Mute Cerebellar: FNF and HKS are intact bilaterally  Labs I have reviewed labs in epic and the results pertinent to this consultation are:   CBC    Component Value Date/Time   WBC 4.9 05/21/2019 0433   RBC 3.77 (L) 05/21/2019 0433   HGB 10.8 (L) 05/21/2019 0433   HGB 11.5 (L) 04/23/2018 0952   HGB 11.5 (L) 07/26/2017 1500   HCT 34.4 (L) 05/21/2019 0433   HCT 33.4 (L) 12/16/2018 1442   HCT 34.9 (L) 07/26/2017 1500   PLT 90 (L) 05/21/2019 0433   PLT 103 (L) 04/23/2018 0952   MCV 91.2 05/21/2019 0433   MCV 89 04/23/2018 0952   MCV 93.4 07/26/2017 1500   MCH 28.6 05/21/2019 0433   MCHC 31.4 05/21/2019 0433   RDW 16.0 (H) 05/21/2019 0433   RDW 17.4 (H) 04/23/2018 0952   RDW 17.5 (H) 07/26/2017 1500   LYMPHSABS 1.0 05/21/2019 0433   LYMPHSABS 1.1 07/26/2017 1500   MONOABS 0.3 05/21/2019 0433   MONOABS 0.2 07/26/2017 1500   EOSABS 0.2 05/21/2019 0433   EOSABS 0.2  07/26/2017 1500   BASOSABS 0.0 05/21/2019 0433   BASOSABS 0.0 07/26/2017 1500    CMP     Component Value Date/Time   NA 142 05/21/2019 0433   NA 145 (H) 01/30/2019 1532   NA 141 02/23/2017 1237   K 4.4 05/21/2019 0433   K 3.8 02/23/2017 1237   CL 102 05/21/2019 0433   CO2 30 05/21/2019 0433   CO2 27 02/23/2017 1237   GLUCOSE 96 05/21/2019 0433   GLUCOSE 113 02/23/2017 1237   BUN 41 (H) 05/21/2019 0433   BUN 15 01/30/2019 1532   BUN 18.3 02/23/2017 1237   CREATININE 1.82 (H) 05/21/2019 0433   CREATININE 1.2 02/23/2017 1237   CALCIUM 9.4 05/21/2019 0433   CALCIUM 9.6 02/23/2017 1237   PROT 7.0 05/16/2019 0609   PROT 7.3 06/18/2018 1205   PROT 8.0 02/23/2017 1237   ALBUMIN 3.4 (L) 05/21/2019 0433   ALBUMIN 4.1 09/24/2017 0824   ALBUMIN 4.1 02/23/2017 1237   AST 24 05/16/2019 0609   AST 24 02/23/2017 1237   ALT 16 05/16/2019 0609   ALT 15 02/23/2017 1237   ALKPHOS 80 05/16/2019 0609   ALKPHOS 105 02/23/2017 1237   BILITOT 1.4 (H) 05/16/2019 0609   BILITOT 0.6 09/24/2017 0824   BILITOT 0.98 02/23/2017 1237   GFRNONAA 35 (L) 05/21/2019 0433   GFRAA 41 (L) 05/21/2019 0433    Lipid Panel     Component Value Date/Time   CHOL 119 09/24/2017 0824   TRIG 94 09/24/2017 0824   HDL 42 09/24/2017 0824   CHOLHDL 2.8 09/24/2017 0824   CHOLHDL 4 12/01/2013 0843   VLDL 37.6 12/01/2013 0843   LDLCALC 58 09/24/2017 0824   LDLDIRECT 154.2 10/20/2013 0846     Imaging I have reviewed the images obtained:  CT-scan of the brain--pending  MRI examination of the brain-pending if if pacemaker is MRI compatible  Etta Quill PA-C [facility] [phone]  M-F  (9:00 am- 5:00 PM)  05/21/2019, 12:20 PM     Assessment:  78 year old male with transient diplopia when looking to the left.  Symptoms have mostly resolved at this time. Given the history of CAD, atrial fibrillation which is currently paced by his pacemaker, along with exam which shows up beating nystagmus  cannot rule out  possible brainstem infarct.   Recommend # MRI of the brain without contrast if possible otherwise obtain CT of head #MRA Head and neck, if not carotid Doppler #Transthoracic Echo #Has thrombocytopenia, stable may consider starting patient on low-dose aspirin #Start or continue Atorvastatin 80 mg/other high intensity statin # BP goal: permissive HTN upto 220/120 mmHg # HBAIC and Lipid profile # Telemetry monitoring # Frequent neuro checks # NPO until passes stroke swallow screen # please page stroke NP  Or  PA  Or MD from 8am -4 pm  as this patient from this time will be  followed by the stroke.   You can look them up on [email or web address]  Password [email or web address]   NEUROHOSPITALIST ADDENDUM Performed a face to face diagnostic evaluation.   I have reviewed the contents of history and physical exam as documented by PA/ARNP/Resident and agree with above documentation.  I have discussed and formulated the above plan as documented. Edits to the note have been made as needed.   Possible brainstem infarct: patient states that he noticed that he had dizziness, double vision around 10:30 AM today.  Patient was sleeping prior to this noticed this when he got up to sit as the physician entered the room.  Denies any sensation of room spinning.  His symptoms have mostly resolved, lasting for about 1 to 2 hours.  Still has mild vertical nystagmus as well as end gaze nystagmus.  At baseline walks with a rolling walker, so difficulty to assess gait. Denies any sensation of ringing in his ears. Recommend head imaging and vascular imaging.  Echocardiogram.  Would be ideal if he is able to obtain MRI, will need to see if pacemaker is compatible.  Treatment also limited at this were stroke due to thrombocytopenia.  Platelets have remained stable in the 90,000s, so may consider a low-dose aspirin.    Karena Addison  MD [facility] [phone]   If 7pm to 7am, please call on call as listed on  AMION.

## 2019-05-21 NOTE — Telephone Encounter (Signed)
Spoke with the patient's wife and she stated that are unable to do a doxy.me visit. She stated that they can do a telephone visit. She has given consent to file his insurance as well.

## 2019-05-22 ENCOUNTER — Inpatient Hospital Stay (HOSPITAL_COMMUNITY): Payer: Medicare Other

## 2019-05-22 DIAGNOSIS — I6330 Cerebral infarction due to thrombosis of unspecified cerebral artery: Secondary | ICD-10-CM

## 2019-05-22 DIAGNOSIS — G3183 Dementia with Lewy bodies: Secondary | ICD-10-CM

## 2019-05-22 LAB — LIPID PANEL
Cholesterol: 103 mg/dL (ref 0–200)
HDL: 40 mg/dL — ABNORMAL LOW (ref >40)
LDL Cholesterol: 51 mg/dL (ref 0–99)
Total CHOL/HDL Ratio: 2.6 RATIO
Triglycerides: 58 mg/dL (ref <150)
VLDL: 12 mg/dL (ref 0–40)

## 2019-05-22 LAB — COMPREHENSIVE METABOLIC PANEL
ALT: 18 U/L (ref 0–44)
AST: 31 U/L (ref 15–41)
Albumin: 3.4 g/dL — ABNORMAL LOW (ref 3.5–5.0)
Alkaline Phosphatase: 74 U/L (ref 38–126)
Anion gap: 8 (ref 5–15)
BUN: 41 mg/dL — ABNORMAL HIGH (ref 8–23)
CO2: 30 mmol/L (ref 22–32)
Calcium: 9.7 mg/dL (ref 8.9–10.3)
Chloride: 104 mmol/L (ref 98–111)
Creatinine, Ser: 1.86 mg/dL — ABNORMAL HIGH (ref 0.61–1.24)
GFR calc Af Amer: 40 mL/min — ABNORMAL LOW (ref >60)
GFR calc non Af Amer: 34 mL/min — ABNORMAL LOW (ref >60)
Glucose, Bld: 105 mg/dL — ABNORMAL HIGH (ref 70–99)
Potassium: 5.4 mmol/L — ABNORMAL HIGH (ref 3.5–5.1)
Sodium: 142 mmol/L (ref 135–145)
Total Bilirubin: 0.4 mg/dL (ref 0.3–1.2)
Total Protein: 7.3 g/dL (ref 6.5–8.1)

## 2019-05-22 LAB — CBC
HCT: 33.6 % — ABNORMAL LOW (ref 39.0–52.0)
Hemoglobin: 10.4 g/dL — ABNORMAL LOW (ref 13.0–17.0)
MCH: 28 pg (ref 26.0–34.0)
MCHC: 31 g/dL (ref 30.0–36.0)
MCV: 90.6 fL (ref 80.0–100.0)
Platelets: 92 10*3/uL — ABNORMAL LOW (ref 150–400)
RBC: 3.71 MIL/uL — ABNORMAL LOW (ref 4.22–5.81)
RDW: 15.7 % — ABNORMAL HIGH (ref 11.5–15.5)
WBC: 5.3 10*3/uL (ref 4.0–10.5)
nRBC: 0 % (ref 0.0–0.2)

## 2019-05-22 LAB — MAGNESIUM: Magnesium: 2.3 mg/dL (ref 1.7–2.4)

## 2019-05-22 LAB — PROTIME-INR
INR: 1.3 — ABNORMAL HIGH (ref 0.8–1.2)
Prothrombin Time: 16.1 seconds — ABNORMAL HIGH (ref 11.4–15.2)

## 2019-05-22 LAB — POTASSIUM: Potassium: 4.4 mmol/L (ref 3.5–5.1)

## 2019-05-22 MED ORDER — ASPIRIN EC 81 MG PO TBEC
81.0000 mg | DELAYED_RELEASE_TABLET | Freq: Every day | ORAL | Status: DC
  Administered 2019-05-22 – 2019-05-26 (×5): 81 mg via ORAL
  Filled 2019-05-22 (×5): qty 1

## 2019-05-22 NOTE — Progress Notes (Signed)
PROGRESS NOTE    [name].  UYQ:[id number] DOB: April 07, 1941 DOA: 05/15/2019 PCP: [name], MD   Brief Narrative:  Jamie Richardsonis [name] 78 y.o.malewithhistory of CAD status post CABG status post maze procedure, pacemaker placement for bradycardia [name]. fib, chronic anemia and thrombocytopenia and possible cirrhosis presented to the cardiology office for follow-up while increasing shortness of breath which has been ongoing for last few months. Patient states his shortness of breath further worsened acutely last few days. Has been [name] increasing lower extremity edema extending up to the thighs. Has noticed increasing erythema and discoloration of the left fourth toe last few days. Has been having frequent falls. Denies hitting his head or losing consciousness. 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  Exam with ataxia worse on R, double vision which was subjectively worse on L eye.  Initially planned for MRI/MRA, but pt with pacemaker which is not MRI compatible.   Symptoms appear to have mostly resolved today. Appreciate neurology recommendations - concerning for brainstem infarct Will defer decision regarding antiplatelet/anticoagulation to neurology given his hx of spontaneous subdural hemorrhage LDL 51, continue crestor A1c 5.6 Echo done last week (EF 50-55%, see report) Bilateral ICA's with 1-39% stenosis Transcranial doppler normal  Head CT - stable and negative Neurology c/s, appreciate recs  Acute right heart failure   Echo from 5/30 with EF 50-55%, septal hypokinesis, RV not well visualized, cavity moderately enlarged (mildly reduced function of RV) - see report Cardiology c/s - signed off on 5/30, recommended d/c on lasix 80 mg BID Currently metoprolol, lasix, and spironolactone on hold given hypotension/bradycardia (noted 6/2 AM) Net negative 7.5 L I/O, daily weights Appears euvolemic at this time, will continue to monitor  Lower extremity cellulitis with possible ischemic fourth toe of the left foot. 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  He does have low platelets.  Albumin mildly low, bili mildly elevated. 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 Continue to monitor off diuretics  History of Lewy body dementia per the chart. Delirium prec  [name]. fib Metoprolol on hold Not on anticoagulation secondary history of spontaneous subdural hematoma in 2013.  Sleep apnea -on CPAPat night  Deconditioning - PT eval; likely related to HF - rec SNF  DVT prophylaxis: SCD Code Status: full  Family Communication: none at bedside Disposition Plan: pending further eval of diplopia, improvement in BP  Consultants:   Neurology  Cardiology  orthopedics  Procedures:  L toe amputation 5/30 Echo IMPRESSIONS    1. The left ventricle has low normal systolic function, with an ejection fraction of 50-55%. The cavity size was normal. Left ventricular diastolic Doppler  parameters are indeterminate.  2. LVEF is approximately 50 to 55% with septal hypokinesis. Poor.  3. The right ventricle was not well visualized. The  cavity was moderately enlarged. There is no increase in right ventricular wall thickness.  4. RV is difficult to visualize, even with Definity It appears dilated and function is at least mildly reduced.  5. The mitral valve is abnormal. Mild thickening of the mitral valve leaflet.  6. The tricuspid valve is grossly normal. Tricuspid valve regurgitation is severe.  7. The aortic valve was not well visualized. Mild thickening of the aortic valve. Mild calcification of the aortic valve.  8. The inferior vena cava was dilated in size with <50% respiratory variability.  9. The interatrial septum was not assessed.  Antimicrobials:  Anti-infectives (From admission, onward)   Start     Dose/Rate Route Frequency Ordered Stop   05/19/19 2030  vancomycin (VANCOCIN) IVPB 750 mg/150 ml premix  Status:  Discontinued     750 mg 150 mL/hr over 60 Minutes Intravenous Every 24 hours 05/19/19 1224 05/20/19 0556   05/16/19 2030  vancomycin (VANCOCIN) IVPB 1000 mg/200 mL premix  Status:  Discontinued     1,000 mg 200 mL/hr over 60 Minutes Intravenous Every 24 hours 05/15/19 2005 05/19/19 1224   05/16/19 2000  ceFEPIme (MAXIPIME) 2 g in sodium chloride 0.9 % 100 mL IVPB  Status:  Discontinued     2 g 200 mL/hr over 30 Minutes Intravenous Every 12 hours 05/16/19 0610 05/20/19 0557   05/16/19 0615  ceFEPIme (MAXIPIME) 2 g in sodium chloride 0.9 % 100 mL IVPB     2 g 200 mL/hr over 30 Minutes Intravenous  Once 05/16/19 0610 05/16/19 1024   05/15/19 2015  vancomycin (VANCOCIN) IVPB 750 mg/150 ml premix     750 mg 150 mL/hr over 60 Minutes Intravenous NOW 05/15/19 2002 05/16/19 0016   05/15/19 1815  vancomycin (VANCOCIN) IVPB 1000 mg/200 mL premix     1,000 mg 200 mL/hr over 60 Minutes Intravenous  Once 05/15/19 1802 05/15/19 2011     Subjective: Double vision improved today.  Objective: Vitals:   05/21/19 1913 05/22/19 0359 05/22/19 0835 05/22/19 1059  BP: 113/72 128/89 106/85 107/62  Pulse: (!) 105 99  (!) 54 (!) 54  Resp: 20 20 18 18   Temp: 98.2 F (36.8 C) 98 F (36.7 C) 97.7 F (36.5 C) 98 F (36.7 C)  TempSrc: Oral Oral Oral   SpO2: 100% 99% 99% 100%  Weight:  86.2 kg    Height:        Intake/Output Summary (Last 24 hours) at 05/22/2019 1345 Last data filed at 05/22/2019 1341 Gross per 24 hour  Intake 2440 ml  Output 2225 ml  Net 215 ml   Filed Weights   05/20/19 0646 05/21/19 0334 05/22/19 0359  Weight: 83.3 kg 85.7 kg 86.2 kg    Examination:  General: No acute distress. Cardiovascular: Heart sounds show Clemon Devaul regular rate, and rhythm.  Lungs: Clear to auscultation bilaterally Abdomen: Soft, nontender, nondistended . Neurological: Alert and oriented 3. Moves all extremities 4, symmetric strength.  FNF improved today. Cranial nerves II through XII grossly intact. Skin: Warm and dry. No rashes or lesions. Extremities: L foot with dressing intact Psychiatric: Mood and affect are normal. Insight and judgment are appropriate.    Data Reviewed: I have personally reviewed following labs and imaging studies  CBC: Recent Labs  Lab 05/17/19 0455 05/18/19 1093 05/19/19 2355 05/20/19 7322 05/21/19 0254 05/22/19 2706  WBC 5.2 6.3 5.8 5.2 4.9 5.3  NEUTROABS 3.7 5.2 3.7 3.7 3.3  --   HGB 9.9* 10.5* 9.6* 10.3* 10.8* 10.4*  HCT 30.7* 32.3* 30.2* 33.2* 34.4* 33.6*  MCV 88.0 88.5 90.4 91.5 91.2 90.6  PLT 109* 112* 95* 97* 90* 92*   Basic Metabolic Panel: Recent Labs  Lab 05/17/19 0455 05/18/19 0707 05/19/19 0643 05/20/19 0609 05/21/19 0433 05/22/19 0338 05/22/19 1011  NA 139 139 140 141 142 142  --   K 3.3* 4.1 4.1 4.6 4.4 5.4* 4.4  CL 98 98 100 101 102 104  --   CO2 28 32 30 33* 30 30  --   GLUCOSE 94 118* 96 94 96 105*  --   BUN 20 28* 32* 39* 41* 41*  --   CREATININE 1.44* 1.65* 1.69* 1.83* 1.82* 1.86*  --   CALCIUM 9.4 9.3 9.1 9.3 9.4 9.7  --   MG 1.7 1.8 2.0 2.1 2.2 2.3  --   PHOS 3.3 3.3 4.0 4.2 3.7  --   --    GFR: Estimated Creatinine Clearance:  36.4 mL/min (Byrd Terrero) (by C-G formula based on SCr of 1.86 mg/dL (H)). Liver Function Tests: Recent Labs  Lab 05/15/19 1746 05/16/19 0609  05/18/19 0707 05/19/19 0643 05/20/19 0609 05/21/19 0433 05/22/19 0338  AST 28 24  --   --   --   --   --  31  ALT 16 16  --   --   --   --   --  18  ALKPHOS 81 80  --   --   --   --   --  74  BILITOT 1.0 1.4*  --   --   --   --   --  0.4  PROT 7.7 7.0  --   --   --   --   --  7.3  ALBUMIN 3.5 3.4*   < > 3.3* 3.1* 3.2* 3.4* 3.4*   < > = values in this interval not displayed.   No results for input(s): LIPASE, AMYLASE in the last 168 hours. No results for input(s): AMMONIA in the last 168 hours. Coagulation Profile: Recent Labs  Lab 05/21/19 1333 05/22/19 0338  INR 1.3* 1.3*   Cardiac Enzymes: Recent Labs  Lab 05/15/19 1535  TROPONINI <0.03   BNP (last 3 results) No results for input(s): PROBNP in the last 8760 hours. HbA1C: Recent Labs    05/21/19 0521  HGBA1C 5.6   CBG: Recent Labs  Lab 05/17/19 1750 05/17/19 2113 05/18/19 0650 05/20/19 2121  GLUCAP 102* 114* 114* 146*   Lipid Profile: Recent Labs    05/22/19 0338  CHOL 103  HDL 40*  LDLCALC 51  TRIG 58  CHOLHDL 2.6   Thyroid Function Tests: No results for input(s): TSH, T4TOTAL, FREET4, T3FREE, THYROIDAB in the last 72 hours. Anemia Panel: No results for input(s): VITAMINB12, FOLATE, FERRITIN, TIBC, IRON, RETICCTPCT in the last 72 hours. Sepsis Labs: Recent Labs  Lab 05/15/19 1803  LATICACIDVEN 0.6    Recent Results (from the past 240 hour(s))  Culture, blood (routine x 2)     Status: None   Collection Time: 05/15/19  6:13 PM  Result Value Ref Range Status   Specimen Description BLOOD RIGHT ANTECUBITAL  Final   Special Requests   Final    BOTTLES DRAWN AEROBIC AND ANAEROBIC Blood Culture adequate volume   Culture   Final    NO GROWTH 5 DAYS Performed at [facility], [place] [address] [place], [place] [address]    Report Status 05/20/2019 FINAL   Final  SARS Coronavirus 2 (CEPHEID - Performed in [place] hospital lab), Hosp Order     Status: None   Collection Time: 05/15/19  6:22 PM  Result Value Ref Range Status   SARS Coronavirus 2 NEGATIVE NEGATIVE Final    Comment: (NOTE) If result is NEGATIVE SARS-CoV-2 target nucleic acids are NOT DETECTED. The SARS-CoV-2 RNA is generally detectable in upper and lower  respiratory specimens during the acute phase of infection. The lowest  concentration of SARS-CoV-2 viral copies this assay can detect is 250  copies / mL. Cortina Vultaggio negative result does not preclude SARS-CoV-2 infection  and should not be used as the sole basis for treatment or other  patient management decisions.  Ruhani Umland negative result may occur with  improper specimen collection / handling, submission of specimen other  than nasopharyngeal swab, presence of viral mutation(s) within the  areas targeted by this assay, and inadequate number of viral copies  (<250 copies / mL). Stina Gane negative result must be combined with clinical  observations, patient history, and epidemiological information. If result is POSITIVE SARS-CoV-2 target nucleic acids are DETECTED. The SARS-CoV-2 RNA is generally detectable in upper and lower  respiratory specimens dur ing the acute phase of infection.  Positive  results are indicative of active infection with SARS-CoV-2.  Clinical  correlation with patient history and other diagnostic information is  necessary to determine patient infection status.  Positive results do  not rule out bacterial infection or co-infection with other viruses. If result is PRESUMPTIVE POSTIVE SARS-CoV-2 nucleic acids MAY BE PRESENT.   Cathye Kreiter presumptive positive result was obtained on the submitted specimen  and confirmed on repeat testing.  While 2019 novel coronavirus  (SARS-CoV-2) nucleic acids may be present in the submitted sample  additional confirmatory testing may be necessary for epidemiological  and / or clinical management  purposes  to differentiate between  SARS-CoV-2 and other Sarbecovirus currently known to infect humans.  If clinically indicated additional testing with an alternate test  methodology [phone]) is advised. The SARS-CoV-2 RNA is generally  detectable in upper and lower respiratory sp ecimens during the acute  phase of infection. The expected result is Negative. Fact Sheet for Patients:  StrictlyIdeas.no Fact Sheet for Healthcare Providers: BankingDealers.co.za This test is not yet approved or cleared by the [place] FDA and has been authorized for detection and/or diagnosis of SARS-CoV-2 by FDA under an Emergency Use Authorization (EUA).  This EUA will remain in effect (meaning this test can be used) for the duration of the COVID-19 declaration under Section 564(b)(1) of the Act, 21 U.S.C. section 360bbb-3(b)(1), unless the authorization is terminated or revoked sooner. Performed at [facility], [place] [address] [place], [place] [address]   Culture, blood (routine x 2)     Status: None   Collection Time: 05/15/19  7:13 PM  Result Value Ref Range Status   Specimen Description BLOOD LEFT WRIST  Final   Special Requests   Final    BOTTLES DRAWN AEROBIC AND ANAEROBIC Blood Culture results may not be optimal due to an excessive volume of blood received in culture bottles   Culture   Final    NO GROWTH 5 DAYS Performed at [facility], [place] [address], [place], [place] [address]    Report Status 05/20/2019 FINAL  Final         Radiology Studies: Ct  Head Wo Contrast  Result Date: 05/21/2019 CLINICAL DATA:  78 year old male right frontal headache. History of subdural hematoma and craniotomy in 2013. EXAM: CT HEAD WITHOUT CONTRAST TECHNIQUE: Contiguous axial images were obtained from the base of the skull through the vertex without intravenous contrast. COMPARISON:  07/11/2018 and earlier. FINDINGS: Brain: Stable cerebral  volume. No midline shift, ventriculomegaly, mass effect, evidence of mass lesion, intracranial hemorrhage or evidence of cortically based acute infarction. Gray-white matter differentiation is within normal limits throughout the brain. Small perivascular space again suspected in the right putamen. No cortical encephalomalacia identified. Vascular: Mild Calcified atherosclerosis at the skull base. No suspicious intracranial vascular hyperdensity. Skull: Stable right parietal craniotomy changes. No acute osseous abnormality identified. Sinuses/Orbits: Visualized paranasal sinuses and mastoids are stable and well pneumatized. Other: No acute orbit or scalp soft tissue findings. IMPRESSION: Stable and negative for age noncontrast CT appearance of the brain. Electronically Signed   By: Genevie Ann M.D.   On: 05/21/2019 13:07   Vas US Carotid  Result Date: 05/22/2019 Carotid Arterial Duplex Study Indications:      CVA. Risk Factors:     Hypertension, hyperlipidemia, Diabetes, coronary artery                   disease. Comparison Study: Carotid duplex done on 12/25/17 showed bilateral 1-39% Performing Technologist: Abram Sander RVS  Examination Guidelines: Jiyah Torpey complete evaluation includes B-mode imaging, spectral Doppler, color Doppler, and power Doppler as needed of all accessible portions of each vessel. Bilateral testing is considered an integral part of Aloysuis Ribaudo complete examination. Limited examinations for reoccurring indications may be performed as noted.  Right Carotid Findings: +----------+--------+--------+--------+-----------+--------+           PSV cm/sEDV cm/sStenosisDescribe   Comments +----------+--------+--------+--------+-----------+--------+ CCA Prox  82      19              homogeneous         +----------+--------+--------+--------+-----------+--------+ CCA Distal73      18              homogeneous         +----------+--------+--------+--------+-----------+--------+ ICA Prox  67      28       1-39%   homogeneous         +----------+--------+--------+--------+-----------+--------+ ICA Distal70      23                                  +----------+--------+--------+--------+-----------+--------+ ECA       104     21                                  +----------+--------+--------+--------+-----------+--------+ +----------+--------+-------+--------+-------------------+           PSV cm/sEDV cmsDescribeArm Pressure (mmHG) +----------+--------+-------+--------+-------------------+ [id number]                                         +----------+--------+-------+--------+-------------------+ +---------+--------+--+--------+--+---------+ VertebralPSV cm/s56EDV cm/s17Antegrade +---------+--------+--+--------+--+---------+  Left Carotid Findings: +----------+--------+--------+--------+-----------+--------+           PSV cm/sEDV cm/sStenosisDescribe   Comments +----------+--------+--------+--------+-----------+--------+ CCA Prox  60      18              homogeneous         +----------+--------+--------+--------+-----------+--------+  CCA Distal74      21              homogeneous         +----------+--------+--------+--------+-----------+--------+ ICA Prox  107     34      1-39%   homogeneous         +----------+--------+--------+--------+-----------+--------+ ICA Distal64      26                                  +----------+--------+--------+--------+-----------+--------+ ECA       109     19                                  +----------+--------+--------+--------+-----------+--------+ +----------+--------+--------+--------+-------------------+ SubclavianPSV cm/sEDV cm/sDescribeArm Pressure (mmHG) +----------+--------+--------+--------+-------------------+           87                                          +----------+--------+--------+--------+-------------------+ +---------+--------+--+--------+-+---------+ VertebralPSV  cm/s38EDV cm/s9Antegrade +---------+--------+--+--------+-+---------+  Summary: Right Carotid: Velocities in the right ICA are consistent with Sarika Baldini 1-39% stenosis. Left Carotid: Velocities in the left ICA are consistent with Ahaana Rochette 1-39% stenosis. Vertebrals: Bilateral vertebral arteries demonstrate antegrade flow. *See table(s) above for measurements and observations.  Electronically signed by Antony Contras MD on 05/22/2019 at 12:38:52 PM.    Final    Vas [place] Transcranial Doppler  Result Date: 05/22/2019  Transcranial Doppler Indications: Stroke. Performing Technologist: Abram Sander RVS  Examination Guidelines: Sharol Croghan complete evaluation includes B-mode imaging, spectral Doppler, color Doppler, and power Doppler as needed of all accessible portions of each vessel. Bilateral testing is considered an integral part of Chrystian Ressler complete examination. Limited examinations for reoccurring indications may be performed as noted.  +----------+-------------+----------+-----------+-------+ RIGHT TCD Right VM (cm)Depth (cm)PulsatilityComment +----------+-------------+----------+-----------+-------+ MCA           52.00                   1             +----------+-------------+----------+-----------+-------+ Term ICA      39.00                 1.19            +----------+-------------+----------+-----------+-------+ PCA           15.00                 1.03            +----------+-------------+----------+-----------+-------+ Opthalmic     15.00                 1.58            +----------+-------------+----------+-----------+-------+ ICA siphon    29.00                 1.06            +----------+-------------+----------+-----------+-------+ Vertebral    -17.00                 1.11            +----------+-------------+----------+-----------+-------+  +----------+------------+----------+-----------+-------+ LEFT TCD  Left VM (cm)Depth (cm)PulsatilityComment  +----------+------------+----------+-----------+-------+ MCA          54.00  1.01            +----------+------------+----------+-----------+-------+ Term ICA     26.00                 1.02            +----------+------------+----------+-----------+-------+ PCA          22.00                 1.08            +----------+------------+----------+-----------+-------+ Opthalmic    19.00                 1.33            +----------+------------+----------+-----------+-------+ ICA siphon   48.00                 0.92            +----------+------------+----------+-----------+-------+ Vertebral    -24.00                1.23            +----------+------------+----------+-----------+-------+  +------------+-------+-------+             VM cm/sComment +------------+-------+-------+ Prox Basilar-32.00         +------------+-------+-------+ Summary: This was Lacreasha Hinds normal transcranial Doppler study, with normal flow direction and velocity of all identified vessels of the anterior and posterior circulations, with no evidence of stenosis, vasospasm or occlusion. There was no evidence of intracranial disease.  *See table(s) above for measurements and observations.  Diagnosing physician: Antony Contras MD Electronically signed by Antony Contras MD on 05/22/2019 at 12:40:18 PM.    Final         Scheduled Meds: . allopurinol  300 mg Oral Daily  . docusate sodium  100 mg Oral BID  . ferrous sulfate  325 mg Oral Q breakfast  . gabapentin  300 mg Oral TID  . mirabegron ER  50 mg Oral Daily  . pantoprazole  40 mg Oral Daily  . potassium chloride SA  20 mEq Oral Daily  . rosuvastatin  40 mg Oral Daily  . sertraline  200 mg Oral Daily  . sodium chloride flush  3 mL Intravenous Once  . tamsulosin  0.4 mg Oral QHS   Continuous Infusions: . sodium chloride 75 mL/hr at 05/20/19 1722  . lactated ringers 10 mL/hr at 05/20/19 2209     LOS: 7 days    Time spent: over 30 min     Fayrene Helper, MD [facility] Pager [email or web address]  If 7PM-7AM, please contact night-coverage [email or web address] Password [email or web address] 05/22/2019, 1:45 PM

## 2019-05-22 NOTE — Progress Notes (Signed)
RT Note:  Asked patient if he needed help with CPAP tonight.  Patient stated he would handle his machine okay.  I did put water in machine for patient.

## 2019-05-22 NOTE — Care Management Important Message (Signed)
Important Message  Patient Details  Name: [name]. MRN: [id number] Date of Birth: September 20, 1941   Medicare Important Message Given:  Yes    Shavona Gunderman 05/22/2019, 1:40 PM

## 2019-05-22 NOTE — Progress Notes (Addendum)
STROKE TEAM PROGRESS NOTE   INTERVAL HISTORY Pt sitting in bed, no acute distress.  He stated that he had left gaze diplopia yesterday morning and resolved in the afternoon.  However, he cannot tell me how long the symptoms lasted.  On exam, he still has inconsistent end-gaze nystagmus on the left, right and upper gaze.  Talking about anticoagulation, he told me that he occasionally walk with walker at home, however he had a lot of falls at home, average almost once a day, and sometimes 3-4 times a day.  Vitals:   05/21/19 1913 05/22/19 0359 05/22/19 0835 05/22/19 1059  BP: 113/72 128/89 106/85 107/62  Pulse: (!) 105 99 (!) 54 (!) 54  Resp: 20 20 18 18   Temp: 98.2 F (36.8 C) 98 F (36.7 C) 97.7 F (36.5 C) 98 F (36.7 C)  TempSrc: Oral Oral Oral   SpO2: 100% 99% 99% 100%  Weight:  86.2 kg    Height:        CBC:  Recent Labs  Lab 05/20/19 0609 05/21/19 0433 05/22/19 0338  WBC 5.2 4.9 5.3  NEUTROABS 3.7 3.3  --   HGB 10.3* 10.8* 10.4*  HCT 33.2* 34.4* 33.6*  MCV 91.5 91.2 90.6  PLT 97* 90* 92*    Basic Metabolic Panel:  Recent Labs  Lab 05/20/19 0609 05/21/19 0433 05/22/19 0338  NA 141 142 142  K 4.6 4.4 5.4*  CL 101 102 104  CO2 33* 30 30  GLUCOSE 94 96 105*  BUN 39* 41* 41*  CREATININE 1.83* 1.82* 1.86*  CALCIUM 9.3 9.4 9.7  MG 2.1 2.2 2.3  PHOS 4.2 3.7  --    Lipid Panel:     Component Value Date/Time   CHOL 103 05/22/2019 0338   CHOL 119 09/24/2017 0824   TRIG 58 05/22/2019 0338   HDL 40 (L) 05/22/2019 0338   HDL 42 09/24/2017 0824   CHOLHDL 2.6 05/22/2019 0338   VLDL 12 05/22/2019 0338   LDLCALC 51 05/22/2019 0338   LDLCALC 58 09/24/2017 0824   HgbA1c:  Lab Results  Component Value Date   HGBA1C 5.6 05/21/2019   Urine Drug Screen: No results found for: LABOPIA, COCAINSCRNUR, LABBENZ, AMPHETMU, THCU, LABBARB  Alcohol Level No results found for: ETH  IMAGING Ct Head Wo Contrast  Result Date: 05/21/2019 CLINICAL DATA:  79 year old male right  frontal headache. History of subdural hematoma and craniotomy in 2013. EXAM: CT HEAD WITHOUT CONTRAST TECHNIQUE: Contiguous axial images were obtained from the base of the skull through the vertex without intravenous contrast. COMPARISON:  07/11/2018 and earlier. FINDINGS: Brain: Stable cerebral volume. No midline shift, ventriculomegaly, mass effect, evidence of mass lesion, intracranial hemorrhage or evidence of cortically based acute infarction. Gray-white matter differentiation is within normal limits throughout the brain. Small perivascular space again suspected in the right putamen. No cortical encephalomalacia identified. Vascular: Mild Calcified atherosclerosis at the skull base. No suspicious intracranial vascular hyperdensity. Skull: Stable right parietal craniotomy changes. No acute osseous abnormality identified. Sinuses/Orbits: Visualized paranasal sinuses and mastoids are stable and well pneumatized. Other: No acute orbit or scalp soft tissue findings. IMPRESSION: Stable and negative for age noncontrast CT appearance of the brain. Electronically Signed   By: Genevie Ann M.D.   On: 05/21/2019 13:07    PHYSICAL EXAM  Temp:  [97.7 F (36.5 C)-98.2 F (36.8 C)] 98 F (36.7 C) (06/04 1059) Pulse Rate:  [54-105] 54 (06/04 1059) Resp:  [18-20] 18 (06/04 1059) BP: (106-128)/(62-89) 107/62 (06/04 1059)  SpO2:  [99 %-100 %] 100 % (06/04 1059) Weight:  [86.2 kg] 86.2 kg (06/04 0359)  General - Well nourished, well developed, in no apparent distress.  Ophthalmologic - fundi not visualized due to noncooperation.  Cardiovascular - Regular rate and rhythm, pacing rhythm on tele.  Mental Status -  Level of arousal and orientation to time, place, and person were intact. Language including expression, naming, repetition, comprehension was assessed and found intact. Fund of Knowledge was assessed and was intact.  Cranial Nerves II - XII - II - Visual field intact OU. III, IV, VI - Extraocular  movements intact, no diplopia, inconsistent end gaze nystagmus on left, right and upper gaze, unsustained.  V - Facial sensation intact bilaterally. VII - Facial movement intact bilaterally. VIII - Hearing & vestibular intact bilaterally. X - Palate elevates symmetrically. XI - Chin turning & shoulder shrug intact bilaterally. XII - Tongue protrusion intact.  Motor Strength - The patient's strength was normal in all extremities and pronator drift was absent.  Left fourth toe amputation with dressing.  Bulk was normal and fasciculations were absent.   Motor Tone - Muscle tone was assessed at the neck and appendages and was normal.  Reflexes - The patient's reflexes were symmetrical in all extremities and he had no pathological reflexes.  Sensory - Light touch, temperature/pinprick were assessed and were symmetrical.    Coordination - The patient had normal movements in the hands with no ataxia or dysmetria.  Tremor was absent.  Gait and Station - deferred.   ASSESSMENT/PLAN [name]. is a 78 y.o. male with history of subdural hematoma, pulmonary fibrosis, peripheral neuropathy, pacemaker, obstructive sleep apnea, hypertension, hyperlipidemia, diabetes, CAD, atrial fibrillation presenting with progressive SOB found to be in [facility] w/ liver cirrhosis, KAI. Also found to have progressive LE edema and discoloration L 4th toe with amputation 5/30. In hospital developed transient diplopia when looking to the L   Bbrainstem small infarct vs. TIA - most likely d/t small vessel disease, but cannot fule out embolic source given known AF not on [facility]  CT head unremarkable  MRI - pacer not compatible  Carotid Doppler  B ICA 1-39% stenosis, VAs antegrade   2D Echo 05/17/2019 EF 50-55%. No source of embolus   TCD unremarkable  LDL 51  HgbA1c 5.6  SCDs for VTE prophylaxis  Diet - heart healthy carb modivied, FR 1200 thin liquids  No antithrombotic prior to admission, now on aspirin  81 mg daily. However, ASA needs to be on hold if platelet < 50K.  Therapy recommendations:  SNF  Disposition:  pending   Atrial Fibrillation  Home anticoagulation:  none   Not an AC canddiate d/t hx spontaneous SDH w/ while on warfarin in 2013 s/p surgical evacuation by Dr. Saintclair Halsted  Pt reported frequent falls at home, average once a day and sometimes 3-4 a day.  Pt has been following with Dr. Jannifer Franklin at [facility] for gait disturbance  Not a candidate for anticoagulation at this time  Hypertension  Stable . 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 Please call with questions. Pt will follow up with Dr. Jannifer Franklin at [facility] in about 4 weeks. Thanks for the consult.  Rosalin Hawking, MD PhD Stroke Neurology 05/22/2019 3:03 PM    To contact Stroke Continuity provider, please refer to [email or web address] After hours, contact General Neurology

## 2019-05-22 NOTE — Progress Notes (Signed)
Attempted exam. Patient with physical therapy. Will try again later.   [name] 05/22/2019 10:15 AM

## 2019-05-22 NOTE — Progress Notes (Signed)
Carotid duplex and TCD has been completed.   Preliminary results in CV Proc.   Abram Sander 05/22/2019 11:53 AM

## 2019-05-22 NOTE — Plan of Care (Signed)
  Problem: Clinical Measurements: Goal: Will remain free from infection Outcome: Progressing Goal: Diagnostic test results will improve Outcome: Progressing   Problem: Nutrition: Goal: Adequate nutrition will be maintained Outcome: Progressing   Problem: Coping: Goal: Level of anxiety will decrease Outcome: Progressing   

## 2019-05-23 LAB — CBC
HCT: 33.5 % — ABNORMAL LOW (ref 39.0–52.0)
Hemoglobin: 10.5 g/dL — ABNORMAL LOW (ref 13.0–17.0)
MCH: 28.5 pg (ref 26.0–34.0)
MCHC: 31.3 g/dL (ref 30.0–36.0)
MCV: 91 fL (ref 80.0–100.0)
Platelets: 87 10*3/uL — ABNORMAL LOW (ref 150–400)
RBC: 3.68 MIL/uL — ABNORMAL LOW (ref 4.22–5.81)
RDW: 15.8 % — ABNORMAL HIGH (ref 11.5–15.5)
WBC: 5 10*3/uL (ref 4.0–10.5)
nRBC: 0 % (ref 0.0–0.2)

## 2019-05-23 LAB — COMPREHENSIVE METABOLIC PANEL
ALT: 20 U/L (ref 0–44)
AST: 29 U/L (ref 15–41)
Albumin: 3.5 g/dL (ref 3.5–5.0)
Alkaline Phosphatase: 73 U/L (ref 38–126)
Anion gap: 8 (ref 5–15)
BUN: 38 mg/dL — ABNORMAL HIGH (ref 8–23)
CO2: 28 mmol/L (ref 22–32)
Calcium: 9.3 mg/dL (ref 8.9–10.3)
Chloride: 104 mmol/L (ref 98–111)
Creatinine, Ser: 1.66 mg/dL — ABNORMAL HIGH (ref 0.61–1.24)
GFR calc Af Amer: 46 mL/min — ABNORMAL LOW (ref >60)
GFR calc non Af Amer: 39 mL/min — ABNORMAL LOW (ref >60)
Glucose, Bld: 98 mg/dL (ref 70–99)
Potassium: 4.4 mmol/L (ref 3.5–5.1)
Sodium: 140 mmol/L (ref 135–145)
Total Bilirubin: 0.6 mg/dL (ref 0.3–1.2)
Total Protein: 7.8 g/dL (ref 6.5–8.1)

## 2019-05-23 LAB — MAGNESIUM: Magnesium: 2.2 mg/dL (ref 1.7–2.4)

## 2019-05-23 NOTE — Consult Note (Signed)
   [facility] CM Inpatient Consult   05/23/2019  [name]. [birth year] [id number]   Patient screened for high risk score for unplanned readmission score and assessed for long length of stay in Medicare NGACO. Patient is eligible for [facility] services/programs .   Review of patient's medical record reveals in the history and physical on 05/15/2019:  [name]. is a 78 y.o. male with history of CAD status post CABG status post maze procedure, pacemaker placement for bradycardia A. fib, chronic anemia and thrombocytopenia and possible cirrhosis presented to the cardiology office for follow-up while increasing shortness of breath which has been ongoing for last few months.  Patient states his shortness of breath further worsened acutely last few days.  Has been a increasing lower extremity edema extending up to the thighs.  Has noticed increasing erythema and discoloration of the left fourth toe last few days.  Has been having frequent falls.  Denies hitting his head or losing consciousness.  Denies chest pain productive cough fever chills abdominal pain nausea or vomiting.  Primary Care Provider is  [name], MD.  This office is listed to provide the transition of care follow up.   Plan:  Patient is currently being recommended for home health with 24 hour supervision verses SNF. Inpatient TOC RNCM notes reveals the patient has been assigned to [facility] HF calls. No further needs assessed at this time.  Patient will also have [facility] for home health.  For questions or changes in plan please contact:   Natividad Brood, RN BSN [facility] Liaison  [phone] business mobile phone Toll free office [phone]  Fax number: [phone] [email or web address]

## 2019-05-23 NOTE — Evaluation (Signed)
Clinical/Bedside Swallow Evaluation Patient Details  Name: [name]. MRN: [id number] Date of Birth: 1941/08/04  Today's Date: 05/23/2019 Time: SLP Start Time (ACUTE ONLY): 1030 SLP Stop Time (ACUTE ONLY): 1056 SLP Time Calculation (min) (ACUTE ONLY): 26 min  Past Medical History:  Past Medical History:  Diagnosis Date  . (HFpEF) heart failure with preserved ejection fraction ([name])    a. 05/2013 Echo: EF 55%, mild LVH, diast dysfxn, Ao sclerosis, mildly dil LA, RV dysfxn (poorly visualized), PASP 48mmHg;  b. 03/2017 Echo: EF 55-60%, no rwma, triv MR, mildly dil RV, mod TR, PASP 35mmHg.  . Atrial fibrillation [name])    s/p Cox Maze 1/09;  Multaq Rx d/c'd in 2014 due to pulmo fibrosis;  coumadin d/c'd in 2014 due to spontaneous subdural hematoma  . BPH (benign prostatic hyperplasia)   . CAD (coronary artery disease), native coronary artery    a. s/p CABG 12/2007;  b. Myoview 12/2011: EF 66%, no scar or ischemia; normal.  . DM (diabetes mellitus) ([place])   . Hyperlipidemia type II   . Hypertension   . 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 OSA (obstructive sleep apnea)   . Pacemaker    PPM - St. Jude  . Peripheral neuropathy 07/31/2018  . Pulmonary fibrosis ([place])    Multaq d/c'd 7/14  . Subdural hematoma ([place]) 07/2012   spontaneous;  coumadin d/c'd => no longer a candidate for anticoagulation   Past Surgical History:  Past Surgical History:  Procedure Laterality Date  . AMPUTATION Left 05/17/2019   Procedure: AMPUTATION LEFT FOURTH TOE;  Surgeon: Meredith Pel, MD;  Location: [place];  Service: Orthopedics;  Laterality: Left;  . APPENDECTOMY    . CHOLECYSTECTOMY    . CORONARY ARTERY BYPASS GRAFT     x3 (left internal mammary artery to distal left anterior descending coronary artery, saphenous vain graft to second circumflex marginal branch, saphenous vain graft to posterior descending coronary artery, endoscopic saphenous vain harvest from right  thigh) and modified Cox - Maze IV procedure.  [name],MD. Electronically signed CHO/MEDQ D: 01/09/2008/ JOB: [id number] cc:  [name]  . [name]  07/30/2012   Procedure: CRANIOTOMY HEMATOMA EVACUATION SUBDURAL;  Surgeon: [name], MD;  Location: [facility] NEURO ORS;  Service: Neurosurgery;  Laterality: Right;  Right craniotomy for evacuation of subdural hematoma  . FOOT SURGERY    . HERNIA REPAIR    . ORCHIECTOMY     Left  /  testicular cancer  . PACEMAKER PLACEMENT     PPM - St. Jude   HPI:   78 y.o. male with history of CAD status post CABG status post maze procedure, pacemaker placement for bradycardia A. fib, chronic anemia and thrombocytopenia and possible cirrhosis presented to the cardiology office for follow-up while increasing shortness of breath which has been ongoing for last few months.  Patient states his shortness of breath further worsened acutely last few days.   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, likely pulmonary fibrosis, progressive since 2018  Assessment / Plan / Recommendation Clinical Impression   No overt s/s of aspiration noted during BSE with consistencies including ice chips--->solids; pt c/o globus sensation occasionally with foods such as certain breads/meats; wife stated (via phone), pt experiencing frequent hiccups and regurgitation recently which may be concerning for esophageal dysphagia; pt given esophageal precautions (smaller meals more frequently, upright when eating/drinking, smaller  bites/sips); GI consult/esophageal assessment may be considered prior to D/C to r/o esophageal concerns; ST will f/u x1 for esophageal precaution education/diet tolerance; thank you for this consult. SLP Visit Diagnosis: Dysphagia, unspecified (R13.10)    Aspiration Risk  Mild aspiration risk    Diet Recommendation   Regular/thin liquids  Medication Administration:  Whole meds with liquid    Other  Recommendations Recommended Consults: Consider GI evaluation;Consider esophageal assessment Oral Care Recommendations: Oral care BID   Follow up Recommendations Other (comment)(GI f/u if d/c prior to completing during hospital stay)      Frequency and Duration min 1 x/week  1 week       Prognosis Prognosis for Safe Diet Advancement: Good      Swallow Study   General Date of Onset: 05/15/19 HPI:  78 y.o. male with history of CAD status post CABG status post maze procedure, pacemaker placement for bradycardia A. fib, chronic anemia and thrombocytopenia and possible cirrhosis presented to the cardiology office for follow-up while increasing shortness of breath which has been ongoing for last few months.  Patient states his shortness of breath further worsened acutely last few days.   Type of Study: Bedside Swallow Evaluation Previous Swallow Assessment: (n/a) Diet Prior to this Study: Thin liquids;Regular Temperature Spikes Noted: No Respiratory Status: Room air History of Recent Intubation: No Behavior/Cognition: Alert;Cooperative;Pleasant mood Oral Cavity Assessment: Within Functional Limits Oral Care Completed by SLP: Recent completion by staff Oral Cavity - Dentition: Adequate natural dentition Vision: Functional for self-feeding Self-Feeding Abilities: Able to feed self Patient Positioning: Upright in bed Baseline Vocal Quality: Other (comment)(min hoarseness) Volitional Cough: Strong Volitional Swallow: Able to elicit    Oral/Motor/Sensory Function Overall Oral Motor/Sensory Function: Within functional limits   Ice Chips Ice chips: Within functional limits Presentation: Spoon   Thin Liquid Thin Liquid: Within functional limits Presentation: Cup;Straw;Spoon    Nectar Thick Nectar Thick Liquid: Not tested   Honey Thick Honey Thick Liquid: Not tested   Puree Puree: Within functional limits Presentation: Self Fed   Solid     Solid:  Within functional limits Presentation: Self Fed      Elvina Sidle, M.S., CCC-SLP 05/23/2019,12:44 PM

## 2019-05-23 NOTE — Progress Notes (Signed)
Physical Therapy Treatment Patient Details Name: [name]. MRN: [id number] DOB: December 27, 1940 Today's Date: 05/23/2019    History of Present Illness Patient is a 78 y/o male who presents with BLE swelling and SOB. Found to have cellulitis with ischemic fourth toe on left foot. Admitted with acute heart failure. EKG- Afib with RBBB. Pt s/p left 4th toe amputation 5/30. PMH includes SDH, pulmonary fibrosis, HTN, HLD, CAD, A-fib.    PT Comments    Pt was seen for mobility and with gait was able to take steps only alongside the bed.  He was too limited by his buckling in knees, and so avoided a risk of leaving bedside.  Pt is scheduled by PT for SNF but has been approved for home therapy.  He is unsafe, requires bed rail to pull up and needs hands on help to come up to stand.  Will continue to follow acutely as he is still requiring help with every step of mobility.   Follow Up Recommendations  Supervision for mobility/OOB;SNF     Equipment Recommendations  Rolling walker with 5" wheels    Recommendations for Other Services       Precautions / Restrictions Precautions Precautions: Fall Required Braces or Orthoses: Other Brace Other Brace: post op shoe--L Restrictions Weight Bearing Restrictions: No LLE Weight Bearing: Weight bearing as tolerated    Mobility  Bed Mobility Overal bed mobility: Needs Assistance Bed Mobility: Supine to Sit;Sit to Supine     Supine to sit: Min assist;Mod assist Sit to supine: Min assist   General bed mobility comments: assist to trunk and use of bed rail, HOB elevated  Transfers Overall transfer level: Needs assistance Equipment used: Rolling walker (2 wheeled);1 person hand held assist Transfers: Sit to/from Stand Sit to Stand: Mod assist         General transfer comment: needs reminders for sequence, help to lift trunk and hips off bed  Ambulation/Gait Ambulation/Gait assistance: Min guard;Min assist Gait Distance (Feet): 64  Feet(16 x 4) Assistive device: Rolling walker (2 wheeled);1 person hand held assist Gait Pattern/deviations: Step-to pattern;Wide base of support;Trunk flexed Gait velocity: decreased Gait velocity interpretation: <1.8 ft/sec, indicate of risk for recurrent falls General Gait Details: needed prompting to sequence and to control direction of walker    Stairs             Wheelchair Mobility    Modified Rankin (Stroke Patients Only)       Balance Overall balance assessment: Needs assistance Sitting-balance support: Feet supported Sitting balance-Leahy Scale: Good     Standing balance support: Bilateral upper extremity supported;During functional activity Standing balance-Leahy Scale: Poor Standing balance comment: needs 1-2 for balance and safety                            Cognition Arousal/Alertness: Awake/alert Behavior During Therapy: Impulsive Overall Cognitive Status: No family/caregiver present to determine baseline cognitive functioning Area of Impairment: Memory;Following commands;Safety/judgement;Awareness;Problem solving                 Orientation Level: Time;Situation   Memory: Decreased recall of precautions;Decreased short-term memory Following Commands: Follows one step commands inconsistently;Follows one step commands with increased time Safety/Judgement: Decreased awareness of safety;Decreased awareness of deficits Awareness: Intellectual Problem Solving: Slow processing;Decreased initiation;Difficulty sequencing;Requires verbal cues;Requires tactile cues General Comments: pt is unsafe to walk away from bed today      Exercises General Exercises - Lower Extremity Mini-Sqauts: AROM;15 reps;Standing  General Comments General comments (skin integrity, edema, etc.): pt is demonstrating poor control of knees in standing although most of the time he can stand in place without help and step. He is unsafe to walk away from the bed with  one person.        Pertinent Vitals/Pain Pain Assessment: No/denies pain    Home Living                      Prior Function            PT Goals (current goals can now be found in the care plan section) Acute Rehab PT Goals Patient Stated Goal: to go home PT Goal Formulation: With patient Progress towards PT goals: Progressing toward goals    Frequency    Min 3X/week      PT Plan Current plan remains appropriate    Co-evaluation              AM-PAC PT "6 Clicks" Mobility   Outcome Measure  Help needed turning from your back to your side while in a flat bed without using bedrails?: A Little Help needed moving from lying on your back to sitting on the side of a flat bed without using bedrails?: A Little Help needed moving to and from a bed to a chair (including a wheelchair)?: A Little Help needed standing up from a chair using your arms (e.g., wheelchair or bedside chair)?: A Lot Help needed to walk in hospital room?: A Lot Help needed climbing 3-5 steps with a railing? : Total 6 Click Score: 14    End of Session Equipment Utilized During Treatment: Gait belt Activity Tolerance: Patient tolerated treatment well Patient left: in bed;with call bell/phone within reach;with nursing/sitter in room Nurse Communication: Mobility status PT Visit Diagnosis: Difficulty in walking, not elsewhere classified (R26.2);Muscle weakness (generalized) (M62.81);Unsteadiness on feet (R26.81);Other abnormalities of gait and mobility (R26.89)     Time: 8099-8338 PT Time Calculation (min) (ACUTE ONLY): 25 min  Charges:  $Gait Training: 8-22 mins $Therapeutic Activity: 8-22 mins                     Ramond Dial 05/23/2019, 3:55 PM  Mee Hives, PT MS Acute Rehab Dept. Number: [place] [facility] [place]

## 2019-05-23 NOTE — Progress Notes (Signed)
Pt has home CPAP equipment with him. Pt states he can place himself on. RT filled water reservoir with sterile water for the pt. RT will continue to monitor.

## 2019-05-23 NOTE — Plan of Care (Signed)
  Problem: Health Behavior/Discharge Planning: Goal: Ability to manage health-related needs will improve Outcome: Progressing   Problem: Clinical Measurements: Goal: Ability to maintain clinical measurements within normal limits will improve Outcome: Progressing Goal: Will remain free from infection Outcome: Progressing   

## 2019-05-23 NOTE — Progress Notes (Signed)
PROGRESS NOTE    [name].  PZW:[id number] DOB: 09-09-1941 DOA: 05/15/2019 PCP: [name], MD   Brief Narrative:  Jamie Richardsonis [name] 78 y.o.malewithhistory of CAD status post CABG status post maze procedure, pacemaker placement for bradycardia [name]. fib, chronic anemia and thrombocytopenia and possible cirrhosis presented to the cardiology office for follow-up while increasing shortness of breath which has been ongoing for last few months. Patient states his shortness of breath further worsened acutely last few days. Has been [name] increasing lower extremity edema extending up to the thighs. Has noticed increasing erythema and discoloration of the left fourth toe last few days. Has been having frequent falls. Denies hitting his head or losing consciousness. 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  Exam with ataxia worse on R, double vision which was subjectively worse on L eye.  Initially planned for MRI/MRA, but pt with pacemaker which is not MRI compatible.   Symptoms appear to have mostly resolved today. Appreciate neurology recommendations - concerning for brainstem infarct Will defer decision regarding antiplatelet/anticoagulation to neurology given his hx of spontaneous subdural hemorrhage LDL 51, continue crestor A1c 5.6 Echo done last week (EF 50-55%, see report) Bilateral ICA's with 1-39% stenosis Transcranial doppler normal  Head CT - stable and negative Neurology c/s,  appreciate recs  Acute right heart failure  Echo from 5/30 with EF 50-55%, septal hypokinesis, RV not well visualized, cavity moderately enlarged (mildly reduced function of RV) - see report Cardiology c/s - signed off on 5/30, recommended d/c on lasix 80 mg BID Currently metoprolol, lasix, and spironolactone on hold given hypotension/bradycardia (noted 6/2 AM) Net negative 7.5 L I/O, daily weights Appears euvolemic at this time, will continue to monitor  Esophageal Dysphagia: F/u with GI outpatient esophageal precautions (smaller meals more frequently, upright when eating/drinking, smaller bites/sips)  Lower extremity cellulitis with possible ischemic fourth toe of the left foot. 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  He does have low platelets.  Albumin mildly low, bili mildly elevated. 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 Delirium prec  [name]. fib Metoprolol on hold Not on anticoagulation secondary history of spontaneous subdural hematoma in 2013.  Sleep apnea -on CPAPat night  Deconditioning - PT eval; likely related to HF - rec SNF  DVT prophylaxis: SCD Code Status: full  Family Communication: none at bedside - discussed with wife over phone Disposition Plan: initially planned for home with home health, but wife decided she's likely unable to care for him and now thinking about SNF.  Pending safe d/c plan  and SNF.   Consultants:   Neurology  Cardiology  orthopedics  Procedures:  L toe amputation  5/30 Echo IMPRESSIONS    1. The left ventricle has low normal systolic function, with an ejection fraction of 50-55%. The cavity size was normal. Left ventricular diastolic Doppler parameters are indeterminate.  2. LVEF is approximately 50 to 55% with septal hypokinesis. Poor.  3. The right ventricle was not well visualized. The cavity was moderately enlarged. There is no increase in right ventricular wall thickness.  4. RV is difficult to visualize, even with Definity It appears dilated and function is at least mildly reduced.  5. The mitral valve is abnormal. Mild thickening of the mitral valve leaflet.  6. The tricuspid valve is grossly normal. Tricuspid valve regurgitation is severe.  7. The aortic valve was not well visualized. Mild thickening of the aortic valve. Mild calcification of the aortic valve.  8. The inferior vena cava was dilated in size with <50% respiratory variability.  9. The interatrial septum was not assessed.  Antimicrobials:  Anti-infectives (From admission, onward)   Start     Dose/Rate Route Frequency Ordered Stop   05/19/19 2030  vancomycin (VANCOCIN) IVPB 750 mg/150 ml premix  Status:  Discontinued     750 mg 150 mL/hr over 60 Minutes Intravenous Every 24 hours 05/19/19 1224 05/20/19 0556   05/16/19 2030  vancomycin (VANCOCIN) IVPB 1000 mg/200 mL premix  Status:  Discontinued     1,000 mg 200 mL/hr over 60 Minutes Intravenous Every 24 hours 05/15/19 2005 05/19/19 1224   05/16/19 2000  ceFEPIme (MAXIPIME) 2 g in sodium chloride 0.9 % 100 mL IVPB  Status:  Discontinued     2 g 200 mL/hr over 30 Minutes Intravenous Every 12 hours 05/16/19 0610 05/20/19 0557   05/16/19 0615  ceFEPIme (MAXIPIME) 2 g in sodium chloride 0.9 % 100 mL IVPB     2 g 200 mL/hr over 30 Minutes Intravenous  Once 05/16/19 0610 05/16/19 1024   05/15/19 2015  vancomycin (VANCOCIN) IVPB 750 mg/150 ml premix     750 mg 150 mL/hr over 60 Minutes Intravenous NOW 05/15/19 2002 05/16/19 0016    05/15/19 1815  vancomycin (VANCOCIN) IVPB 1000 mg/200 mL premix     1,000 mg 200 mL/hr over 60 Minutes Intravenous  Once 05/15/19 1802 05/15/19 2011     Subjective: No complaints. Not interested in SNF particularly. No double vision. Discussed f/u with PT and further discussions after thsi.  Objective: Vitals:   05/23/19 0425 05/23/19 0652 05/23/19 0904 05/23/19 1625  BP: 103/68 111/69 (!) 107/94 104/61  Pulse: (!) 58 (!) 55 (!) 54 (!) 57  Resp:  18 18 18   Temp:  98.1 F (36.7 C) 98 F (36.7 C) 98.3 F (36.8 C)  TempSrc:  Oral Oral Oral  SpO2: 99%  100% 94%  Weight: 83.5 kg     Height:        Intake/Output Summary (Last 24 hours) at 05/23/2019 1910 Last data filed at 05/23/2019 1628 Gross per 24 hour  Intake 870 ml  Output 1975 ml  Net -1105 ml   Filed Weights   05/21/19 0334 05/22/19 0359 05/23/19 0425  Weight: 85.7 kg 86.2 kg 83.5 kg    Examination:  General: No acute distress. Cardiovascular: Heart sounds show Brelynn Wheller regular rate, and rhythm.  Lungs: Clear to auscultation bilaterally  Abdomen: Soft, nontender, nondistended  Neurological: Alert and oriented 3. Moves all extremities 4. Cranial nerves II through XII grossly intact.  Skin: Warm and dry. No rashes or lesions. Extremities: L dressing intact    Data Reviewed: I have personally reviewed following labs and imaging studies  CBC: Recent Labs  Lab 05/17/19 0455 05/18/19 0707 05/19/19 0643 05/20/19 0609 05/21/19 0433 05/22/19 0338 05/23/19 0449  WBC 5.2 6.3 5.8 5.2 4.9 5.3 5.0  NEUTROABS 3.7 5.2 3.7 3.7 3.3  --   --   HGB 9.9* 10.5* 9.6* 10.3* 10.8* 10.4* 10.5*  HCT 30.7* 32.3* 30.2* 33.2* 34.4* 33.6* 33.5*  MCV 88.0 88.5 90.4 91.5 91.2 90.6 91.0  PLT 109* 112* 95* 97* 90* 92* 87*   Basic Metabolic Panel: Recent Labs  Lab 05/17/19 0455 05/18/19 0707 05/19/19 0643 05/20/19 0609 05/21/19 0433 05/22/19 0338 05/22/19 1011 05/23/19 0449  NA 139 139 140 141 142 142  --  140  K 3.3* 4.1 4.1  4.6 4.4 5.4* 4.4 4.4  CL 98 98 100 101 102 104  --  104  CO2 28 32 30 33* 30 30  --  28  GLUCOSE 94 118* 96 94 96 105*  --  98  BUN 20 28* 32* 39* 41* 41*  --  38*  CREATININE 1.44* 1.65* 1.69* 1.83* 1.82* 1.86*  --  1.66*  CALCIUM 9.4 9.3 9.1 9.3 9.4 9.7  --  9.3  MG 1.7 1.8 2.0 2.1 2.2 2.3  --  2.2  PHOS 3.3 3.3 4.0 4.2 3.7  --   --   --    GFR: Estimated Creatinine Clearance: 37.3 mL/min (Keefer Soulliere) (by C-G formula based on SCr of 1.66 mg/dL (H)). Liver Function Tests: Recent Labs  Lab 05/19/19 2633 05/20/19 0609 05/21/19 0433 05/22/19 0338 05/23/19 0449  AST  --   --   --  31 29  ALT  --   --   --  18 20  ALKPHOS  --   --   --  74 73  BILITOT  --   --   --  0.4 0.6  PROT  --   --   --  7.3 7.8  ALBUMIN 3.1* 3.2* 3.4* 3.4* 3.5   No results for input(s): LIPASE, AMYLASE in the last 168 hours. No results for input(s): AMMONIA in the last 168 hours. Coagulation Profile: Recent Labs  Lab 05/21/19 1333 05/22/19 0338  INR 1.3* 1.3*   Cardiac Enzymes: No results for input(s): CKTOTAL, CKMB, CKMBINDEX, TROPONINI in the last 168 hours. BNP (last 3 results) No results for input(s): PROBNP in the last 8760 hours. HbA1C: Recent Labs    05/21/19 0521  HGBA1C 5.6   CBG: Recent Labs  Lab 05/17/19 1750 05/17/19 2113 05/18/19 0650 05/20/19 2121  GLUCAP 102* 114* 114* 146*   Lipid Profile: Recent Labs    05/22/19 0338  CHOL 103  HDL 40*  LDLCALC 51  TRIG 58  CHOLHDL 2.6   Thyroid Function Tests: No results for input(s): TSH, T4TOTAL, FREET4, T3FREE, THYROIDAB in the last 72 hours. Anemia Panel: No results for input(s): VITAMINB12, FOLATE, FERRITIN, TIBC, IRON, RETICCTPCT in the last 72 hours. Sepsis Labs: No results for input(s): PROCALCITON, LATICACIDVEN in the last 168 hours.  Recent Results (from the past 240 hour(s))  Culture, blood (routine x 2)     Status: None   Collection Time: 05/15/19  6:13 PM  Result Value Ref Range Status   Specimen Description BLOOD  RIGHT ANTECUBITAL  Final   Special Requests   Final    BOTTLES DRAWN AEROBIC AND ANAEROBIC Blood Culture adequate volume  Culture   Final    NO GROWTH 5 DAYS Performed at [facility], [place] [address], [place], [place] [address]    Report Status 05/20/2019 FINAL  Final  SARS Coronavirus 2 (CEPHEID - Performed in [place] hospital lab), Hosp Order     Status: None   Collection Time: 05/15/19  6:22 PM  Result Value Ref Range Status   SARS Coronavirus 2 NEGATIVE NEGATIVE Final    Comment: (NOTE) If result is NEGATIVE SARS-CoV-2 target nucleic acids are NOT DETECTED. The SARS-CoV-2 RNA is generally detectable in upper and lower  respiratory specimens during the acute phase of infection. The lowest  concentration of SARS-CoV-2 viral copies this assay can detect is 250  copies / mL. Enyah Moman negative result does not preclude SARS-CoV-2 infection  and should not be used as the sole basis for treatment or other  patient management decisions.  Jairy Angulo negative result may occur with  improper specimen collection / handling, submission of specimen other  than nasopharyngeal swab, presence of viral mutation(s) within the  areas targeted by this assay, and inadequate number of viral copies  (<250 copies / mL). Alicyn Klann negative result must be combined with clinical  observations, patient history, and epidemiological information. If result is POSITIVE SARS-CoV-2 target nucleic acids are DETECTED. The SARS-CoV-2 RNA is generally detectable in upper and lower  respiratory specimens dur ing the acute phase of infection.  Positive  results are indicative of active infection with SARS-CoV-2.  Clinical  correlation with patient history and other diagnostic information is  necessary to determine patient infection status.  Positive results do  not rule out bacterial infection or co-infection with other viruses. If result is PRESUMPTIVE POSTIVE SARS-CoV-2 nucleic acids MAY BE PRESENT.   Mavery Milling presumptive positive  result was obtained on the submitted specimen  and confirmed on repeat testing.  While 2019 novel coronavirus  (SARS-CoV-2) nucleic acids may be present in the submitted sample  additional confirmatory testing may be necessary for epidemiological  and / or clinical management purposes  to differentiate between  SARS-CoV-2 and other Sarbecovirus currently known to infect humans.  If clinically indicated additional testing with an alternate test  methodology [phone]) is advised. The SARS-CoV-2 RNA is generally  detectable in upper and lower respiratory sp ecimens during the acute  phase of infection. The expected result is Negative. Fact Sheet for Patients:  StrictlyIdeas.no Fact Sheet for Healthcare Providers: BankingDealers.co.za This test is not yet approved or cleared by the [place] FDA and has been authorized for detection and/or diagnosis of SARS-CoV-2 by FDA under an Emergency Use Authorization (EUA).  This EUA will remain in effect (meaning this test can be used) for the duration of the COVID-19 declaration under Section 564(b)(1) of the Act, 21 U.S.C. section 360bbb-3(b)(1), unless the authorization is terminated or revoked sooner. Performed at [facility], [place] [address], [place], [place] [address]   Culture, blood (routine x 2)     Status: None   Collection Time: 05/15/19  7:13 PM  Result Value Ref Range Status   Specimen Description BLOOD LEFT WRIST  Final   Special Requests   Final    BOTTLES DRAWN AEROBIC AND ANAEROBIC Blood Culture results may not be optimal due to an excessive volume of blood received in culture bottles   Culture   Final    NO GROWTH 5 DAYS Performed at [facility], [place] [address] [place], [place] [address]    Report Status 05/20/2019  FINAL  Final         Radiology Studies: Vas US Carotid  Result Date: 05/22/2019 Carotid Arterial Duplex Study Indications:      CVA. Risk  Factors:     Hypertension, hyperlipidemia, Diabetes, coronary artery                   disease. Comparison Study: Carotid duplex done on 12/25/17 showed bilateral 1-39% Performing Technologist: Abram Sander RVS  Examination Guidelines: Swetha Rayle complete evaluation includes B-mode imaging, spectral Doppler, color Doppler, and power Doppler as needed of all accessible portions of each vessel. Bilateral testing is considered an integral part of Aashvi Rezabek complete examination. Limited examinations for reoccurring indications may be performed as noted.  Right Carotid Findings: +----------+--------+--------+--------+-----------+--------+             PSV cm/s EDV cm/s Stenosis Describe    Comments  +----------+--------+--------+--------+-----------+--------+  CCA Prox   82       19                homogeneous           +----------+--------+--------+--------+-----------+--------+  CCA Distal 73       18                homogeneous           +----------+--------+--------+--------+-----------+--------+  ICA Prox   67       28       1-39%    homogeneous           +----------+--------+--------+--------+-----------+--------+  ICA Distal 70       23                                      +----------+--------+--------+--------+-----------+--------+  ECA        104      21                                      +----------+--------+--------+--------+-----------+--------+ +----------+--------+-------+--------+-------------------+             PSV cm/s EDV cms Describe Arm Pressure (mmHG)  +----------+--------+-------+--------+-------------------+  Subclavian 76                                             +----------+--------+-------+--------+-------------------+ +---------+--------+--+--------+--+---------+  Vertebral PSV cm/s 56 EDV cm/s 17 Antegrade  +---------+--------+--+--------+--+---------+  Left Carotid Findings: +----------+--------+--------+--------+-----------+--------+             PSV cm/s EDV cm/s Stenosis Describe    Comments   +----------+--------+--------+--------+-----------+--------+  CCA Prox   60       18                homogeneous           +----------+--------+--------+--------+-----------+--------+  CCA Distal 74       21                homogeneous           +----------+--------+--------+--------+-----------+--------+  ICA Prox   107      34       1-39%    homogeneous           +----------+--------+--------+--------+-----------+--------+  ICA Distal 64  26                                      +----------+--------+--------+--------+-----------+--------+  ECA        109      19                                      +----------+--------+--------+--------+-----------+--------+ +----------+--------+--------+--------+-------------------+  Subclavian PSV cm/s EDV cm/s Describe Arm Pressure (mmHG)  +----------+--------+--------+--------+-------------------+             87                                              +----------+--------+--------+--------+-------------------+ +---------+--------+--+--------+-+---------+  Vertebral PSV cm/s 38 EDV cm/s 9 Antegrade  +---------+--------+--+--------+-+---------+  Summary: Right Carotid: Velocities in the right ICA are consistent with Shatera Rennert 1-39% stenosis. Left Carotid: Velocities in the left ICA are consistent with Norris Brumbach 1-39% stenosis. Vertebrals: Bilateral vertebral arteries demonstrate antegrade flow. *See table(s) above for measurements and observations.  Electronically signed by Antony Contras MD on 05/22/2019 at 12:38:52 PM.    Final    Vas [place] Transcranial Doppler  Result Date: 05/22/2019  Transcranial Doppler Indications: Stroke. Performing Technologist: Abram Sander RVS  Examination Guidelines: Zamora Colton complete evaluation includes B-mode imaging, spectral Doppler, color Doppler, and power Doppler as needed of all accessible portions of each vessel. Bilateral testing is considered an integral part of Miquel Stacks complete examination. Limited examinations for reoccurring indications may be performed as  noted.  +----------+-------------+----------+-----------+-------+  RIGHT TCD  Right VM (cm) Depth (cm) Pulsatility Comment  +----------+-------------+----------+-----------+-------+  MCA            52.00                     1               +----------+-------------+----------+-----------+-------+  Term ICA       39.00                   1.19              +----------+-------------+----------+-----------+-------+  PCA            15.00                   1.03              +----------+-------------+----------+-----------+-------+  Opthalmic      15.00                   1.58              +----------+-------------+----------+-----------+-------+  ICA siphon     29.00                   1.06              +----------+-------------+----------+-----------+-------+  Vertebral     -17.00                   1.11              +----------+-------------+----------+-----------+-------+  +----------+------------+----------+-----------+-------+  LEFT TCD   Left VM (cm) Depth (cm) Pulsatility Comment  +----------+------------+----------+-----------+-------+  MCA  54.00                   1.01              +----------+------------+----------+-----------+-------+  Term ICA      26.00                   1.02              +----------+------------+----------+-----------+-------+  PCA           22.00                   1.08              +----------+------------+----------+-----------+-------+  Opthalmic     19.00                   1.33              +----------+------------+----------+-----------+-------+  ICA siphon    48.00                   0.92              +----------+------------+----------+-----------+-------+  Vertebral     -24.00                  1.23              +----------+------------+----------+-----------+-------+  +------------+-------+-------+               VM cm/s Comment  +------------+-------+-------+  Prox Basilar -32.00           +------------+-------+-------+ Summary: This was Jaelan Rasheed normal transcranial Doppler study, with  normal flow direction and velocity of all identified vessels of the anterior and posterior circulations, with no evidence of stenosis, vasospasm or occlusion. There was no evidence of intracranial disease.  *See table(s) above for measurements and observations.  Diagnosing physician: Antony Contras MD Electronically signed by Antony Contras MD on 05/22/2019 at 12:40:18 PM.    Final         Scheduled Meds:  allopurinol  300 mg Oral Daily   aspirin EC  81 mg Oral Daily   docusate sodium  100 mg Oral BID   ferrous sulfate  325 mg Oral Q breakfast   gabapentin  300 mg Oral TID   mirabegron ER  50 mg Oral Daily   pantoprazole  40 mg Oral Daily   potassium chloride SA  20 mEq Oral Daily   rosuvastatin  40 mg Oral Daily   sertraline  200 mg Oral Daily   sodium chloride flush  3 mL Intravenous Once   tamsulosin  0.4 mg Oral QHS   Continuous Infusions:  sodium chloride 75 mL/hr at 05/20/19 1722   lactated ringers 10 mL/hr at 05/20/19 2209     LOS: 8 days    Time spent: over 52 min    Fayrene Helper, MD [facility] Pager [email or web address]  If 7PM-7AM, please contact night-coverage [email or web address] Password [email or web address] 05/23/2019, 7:10 PM

## 2019-05-24 LAB — COMPREHENSIVE METABOLIC PANEL
ALT: 19 U/L (ref 0–44)
AST: 30 U/L (ref 15–41)
Albumin: 3.4 g/dL — ABNORMAL LOW (ref 3.5–5.0)
Alkaline Phosphatase: 64 U/L (ref 38–126)
Anion gap: 9 (ref 5–15)
BUN: 35 mg/dL — ABNORMAL HIGH (ref 8–23)
CO2: 26 mmol/L (ref 22–32)
Calcium: 9.3 mg/dL (ref 8.9–10.3)
Chloride: 105 mmol/L (ref 98–111)
Creatinine, Ser: 1.49 mg/dL — ABNORMAL HIGH (ref 0.61–1.24)
GFR calc Af Amer: 52 mL/min — ABNORMAL LOW (ref >60)
GFR calc non Af Amer: 45 mL/min — ABNORMAL LOW (ref >60)
Glucose, Bld: 93 mg/dL (ref 70–99)
Potassium: 4.6 mmol/L (ref 3.5–5.1)
Sodium: 140 mmol/L (ref 135–145)
Total Bilirubin: 0.9 mg/dL (ref 0.3–1.2)
Total Protein: 7.3 g/dL (ref 6.5–8.1)

## 2019-05-24 LAB — CBC
HCT: 31.5 % — ABNORMAL LOW (ref 39.0–52.0)
Hemoglobin: 10.1 g/dL — ABNORMAL LOW (ref 13.0–17.0)
MCH: 28.8 pg (ref 26.0–34.0)
MCHC: 32.1 g/dL (ref 30.0–36.0)
MCV: 89.7 fL (ref 80.0–100.0)
Platelets: 91 10*3/uL — ABNORMAL LOW (ref 150–400)
RBC: 3.51 MIL/uL — ABNORMAL LOW (ref 4.22–5.81)
RDW: 16 % — ABNORMAL HIGH (ref 11.5–15.5)
WBC: 5.8 10*3/uL (ref 4.0–10.5)
nRBC: 0 % (ref 0.0–0.2)

## 2019-05-24 NOTE — Plan of Care (Signed)
  Problem: Clinical Measurements: Goal: Will remain free from infection Outcome: Progressing   Problem: Activity: Goal: Risk for activity intolerance will decrease Outcome: Progressing   Problem: Safety: Goal: Ability to remain free from injury will improve Outcome: Progressing   

## 2019-05-24 NOTE — Progress Notes (Signed)
PROGRESS NOTE    [name].  EHU:[id number] DOB: 1941/05/23 DOA: 05/15/2019 PCP: [name], MD   Brief Narrative:  Jamie Richardsonis [name] 78 y.o.malewithhistory of CAD status post CABG status post maze procedure, pacemaker placement for bradycardia Jamie Dols. fib, chronic anemia and thrombocytopenia and possible cirrhosis presented to the cardiology office for follow-up while increasing shortness of breath which has been ongoing for last few months. Patient states his shortness of breath further worsened acutely last few days. Has been Edrees Valent increasing lower extremity edema extending up to the thighs. Has noticed increasing erythema and discoloration of the left fourth toe last few days. Has been having frequent falls. Denies hitting his head or losing consciousness. 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  Exam with ataxia worse on R, double vision which was subjectively worse on L eye.  Initially planned for MRI/MRA, but pt with pacemaker which is not MRI compatible.   Symptoms appear to have mostly resolved today. Appreciate neurology recommendations - concerning for brainstem infarct Will defer decision regarding antiplatelet/anticoagulation to neurology given his hx of spontaneous subdural hemorrhage LDL 51, continue crestor A1c 5.6 Echo done last week (EF 50-55%, see report) Bilateral ICA's with 1-39% stenosis Transcranial doppler normal  Head CT - stable and negative Neurology c/s,  appreciate recs  Acute right heart failure  Echo from 5/30 with EF 50-55%, septal hypokinesis, RV not well visualized, cavity moderately enlarged (mildly reduced function of RV) - see report Cardiology c/s - signed off on 5/30, recommended d/c on lasix 80 mg BID Currently metoprolol, lasix, and spironolactone on hold given hypotension/bradycardia (noted 6/2 AM) EKG 6/6 shows afib with SVR, continue to hold metop Net negative 7.5 L I/O, daily weights Appears euvolemic at this time, will continue to monitor  Esophageal Dysphagia: F/u with GI outpatient esophageal precautions (smaller meals more frequently, upright when eating/drinking, smaller bites/sips)  Lower extremity cellulitis with possible ischemic fourth toe of the left foot. 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  He does have low platelets.  Albumin mildly low, bili mildly elevated. 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 Delirium prec  Kennah Hehr. fib Metoprolol on hold Not on anticoagulation secondary history of spontaneous subdural hematoma in 2013.  Sleep apnea -on CPAPat night  Deconditioning - PT eval; likely related to HF - rec SNF  DVT prophylaxis: SCD Code Status: full  Family Communication: none at bedside - discussed with wife over phone Disposition Plan: initially planned for home with home health, but wife decided she's likely unable to care for him  and now thinking about SNF.  Pending safe d/c plan and SNF.   Consultants:   Neurology  Cardiology  orthopedics   Procedures:  L toe amputation 5/30 Echo IMPRESSIONS    1. The left ventricle has low normal systolic function, with an ejection fraction of 50-55%. The cavity size was normal. Left ventricular diastolic Doppler parameters are indeterminate.  2. LVEF is approximately 50 to 55% with septal hypokinesis. Poor.  3. The right ventricle was not well visualized. The cavity was moderately enlarged. There is no increase in right ventricular wall thickness.  4. RV is difficult to visualize, even with Definity It appears dilated and function is at least mildly reduced.  5. The mitral valve is abnormal. Mild thickening of the mitral valve leaflet.  6. The tricuspid valve is grossly normal. Tricuspid valve regurgitation is severe.  7. The aortic valve was not well visualized. Mild thickening of the aortic valve. Mild calcification of the aortic valve.  8. The inferior vena cava was dilated in size with <50% respiratory variability.  9. The interatrial septum was not assessed.  Antimicrobials:  Anti-infectives (From admission, onward)   Start     Dose/Rate Route Frequency Ordered Stop   05/19/19 2030  vancomycin (VANCOCIN) IVPB 750 mg/150 ml premix  Status:  Discontinued     750 mg 150 mL/hr over 60 Minutes Intravenous Every 24 hours 05/19/19 1224 05/20/19 0556   05/16/19 2030  vancomycin (VANCOCIN) IVPB 1000 mg/200 mL premix  Status:  Discontinued     1,000 mg 200 mL/hr over 60 Minutes Intravenous Every 24 hours 05/15/19 2005 05/19/19 1224   05/16/19 2000  ceFEPIme (MAXIPIME) 2 g in sodium chloride 0.9 % 100 mL IVPB  Status:  Discontinued     2 g 200 mL/hr over 30 Minutes Intravenous Every 12 hours 05/16/19 0610 05/20/19 0557   05/16/19 0615  ceFEPIme (MAXIPIME) 2 g in sodium chloride 0.9 % 100 mL IVPB     2 g 200 mL/hr over 30 Minutes Intravenous  Once 05/16/19 0610 05/16/19 1024   05/15/19 2015  vancomycin (VANCOCIN) IVPB 750 mg/150 ml premix     750 mg 150 mL/hr over 60 Minutes Intravenous  NOW 05/15/19 2002 05/16/19 0016   05/15/19 1815  vancomycin (VANCOCIN) IVPB 1000 mg/200 mL premix     1,000 mg 200 mL/hr over 60 Minutes Intravenous  Once 05/15/19 1802 05/15/19 2011     Subjective: No complaints.   Objective: Vitals:   05/23/19 1947 05/24/19 0444 05/24/19 0842 05/24/19 1133  BP: (!) 108/59 102/79 105/79 (!) 99/58  Pulse: (!) 54 (!) 59 (!) 108 (!) 55  Resp: 20 18 18 16   Temp:  98.4 F (36.9 C)  99.1 F (37.3 C)  TempSrc:  Oral  Oral  SpO2: 97% 95% 97% 99%  Weight:  83.4 kg    Height:        Intake/Output Summary (Last 24 hours) at 05/24/2019 1615 Last data filed at 05/24/2019 1353 Gross per 24 hour  Intake 710 ml  Output 725 ml  Net -15 ml   Filed Weights   05/22/19 0359 05/23/19 0425 05/24/19 0444  Weight: 86.2 kg 83.5 kg 83.4 kg    Examination:  General: No acute distress. Cardiovascular: Heart sounds show [name] regular rate, and rhythm.  Lungs: Clear to auscultation bilaterally Abdomen: Soft, nontender, nondistended Neurological: Alert and oriented 3. Moves all extremities 4 . Cranial nerves II through XII grossly intact. Skin: Warm and dry. No rashes or lesions. Extremities:  No clubbing or cyanosis. No edema.  Psychiatric: Mood and affect are normal. Insight and judgment are appropriate.     Data Reviewed: I have personally reviewed following labs and imaging studies  CBC: Recent Labs  Lab 05/18/19 0707 05/19/19 0643 05/20/19 0609 05/21/19 0433 05/22/19 0338 05/23/19 0449 05/24/19 0518  WBC 6.3 5.8 5.2 4.9 5.3 5.0 5.8  NEUTROABS 5.2 3.7 3.7 3.3  --   --   --   HGB 10.5* 9.6* 10.3* 10.8* 10.4* 10.5* 10.1*  HCT 32.3* 30.2* 33.2* 34.4* 33.6* 33.5* 31.5*  MCV 88.5 90.4 91.5 91.2 90.6 91.0 89.7  PLT 112* 95* 97* 90* 92* 87* 91*   Basic Metabolic Panel: Recent Labs  Lab 05/18/19 0707 05/19/19 0643 05/20/19 0609 05/21/19 0433 05/22/19 0338 05/22/19 1011 05/23/19 0449 05/24/19 0518  NA 139 140 141 142 142  --  140 140  K 4.1 4.1  4.6 4.4 5.4* 4.4 4.4 4.6  CL 98 100 101 102 104  --  104 105  CO2 32 30 33* 30 30  --  28 26  GLUCOSE 118* 96 94 96 105*  --  98 93  BUN 28* 32* 39* 41* 41*  --  38* 35*  CREATININE 1.65* 1.69* 1.83* 1.82* 1.86*  --  1.66* 1.49*  CALCIUM 9.3 9.1 9.3 9.4 9.7  --  9.3 9.3  MG 1.8 2.0 2.1 2.2 2.3  --  2.2  --   PHOS 3.3 4.0 4.2 3.7  --   --   --   --    GFR: Estimated Creatinine Clearance: 41.5 mL/min ([place] Rembold) (by C-G formula based on SCr of 1.49 mg/dL (H)). Liver Function Tests: Recent Labs  Lab 05/20/19 0609 05/21/19 0433 05/22/19 0338 05/23/19 0449 05/24/19 0518  AST  --   --  31 29 30   ALT  --   --  18 20 19   ALKPHOS  --   --  74 73 64  BILITOT  --   --  0.4 0.6 0.9  PROT  --   --  7.3 7.8 7.3  ALBUMIN 3.2* 3.4* 3.4* 3.5 3.4*   No results for input(s): LIPASE, AMYLASE in the last 168 hours. No results for input(s): AMMONIA in the last 168 hours. Coagulation Profile: Recent Labs  Lab 05/21/19 1333 05/22/19 0338  INR 1.3* 1.3*   Cardiac Enzymes: No results for input(s): CKTOTAL, CKMB, CKMBINDEX, TROPONINI in the last 168 hours. BNP (last 3 results) No results for input(s): PROBNP in the last 8760 hours. HbA1C: No results for input(s): HGBA1C in the last 72 hours. CBG: Recent Labs  Lab 05/17/19 1750 05/17/19 2113 05/18/19 0650 05/20/19 2121  GLUCAP 102* 114* 114* 146*   Lipid Profile: Recent Labs    05/22/19 0338  CHOL 103  HDL 40*  LDLCALC 51  TRIG 58  CHOLHDL 2.6   Thyroid Function Tests: No results for input(s): TSH, T4TOTAL, FREET4, T3FREE, THYROIDAB in the last 72 hours. Anemia Panel: No results for input(s): VITAMINB12, FOLATE, FERRITIN, TIBC, IRON, RETICCTPCT in the last 72 hours. Sepsis Labs: No results for input(s): PROCALCITON, LATICACIDVEN in the last 168 hours.  Recent Results (from the past 240 hour(s))  Culture, blood (routine x 2)     Status: None   Collection Time: 05/15/19  6:13 PM  Result Value Ref Range Status   Specimen Description  BLOOD RIGHT ANTECUBITAL  Final   Special Requests   Final    BOTTLES DRAWN AEROBIC AND ANAEROBIC Blood Culture adequate volume  Culture   Final    NO GROWTH 5 DAYS Performed at [facility], [place] [address] [place], [place] [address]    Report Status 05/20/2019 FINAL  Final  SARS Coronavirus 2 (CEPHEID - Performed in [place] hospital lab), Hosp Order     Status: None   Collection Time: 05/15/19  6:22 PM  Result Value Ref Range Status   SARS Coronavirus 2 NEGATIVE NEGATIVE Final    Comment: (NOTE) If result is NEGATIVE SARS-CoV-2 target nucleic acids are NOT DETECTED. The SARS-CoV-2 RNA is generally detectable in upper and lower  respiratory specimens during the acute phase of infection. The lowest  concentration of SARS-CoV-2 viral copies this assay can detect is 250  copies / mL. Donnie Gedeon negative result does not preclude SARS-CoV-2 infection  and should not be used as the sole basis for treatment or other  patient management decisions.  Gredmarie Delange negative result may occur with  improper specimen collection / handling, submission of specimen other  than nasopharyngeal swab, presence of viral mutation(s) within the  areas targeted by this assay, and inadequate number of viral copies  (<250 copies / mL). Marica Trentham negative result must be combined with clinical  observations, patient history, and epidemiological information. If result is POSITIVE SARS-CoV-2 target nucleic acids are DETECTED. The SARS-CoV-2 RNA is generally detectable in upper and lower  respiratory specimens dur ing the acute phase of infection.  Positive  results are indicative of active infection with SARS-CoV-2.  Clinical  correlation with patient history and other diagnostic information is  necessary to determine patient infection status.  Positive results do  not rule out bacterial infection or co-infection with other viruses. If result is PRESUMPTIVE POSTIVE SARS-CoV-2 nucleic acids MAY BE PRESENT.   Marjorie Deprey presumptive  positive result was obtained on the submitted specimen  and confirmed on repeat testing.  While 2019 novel coronavirus  (SARS-CoV-2) nucleic acids may be present in the submitted sample  additional confirmatory testing may be necessary for epidemiological  and / or clinical management purposes  to differentiate between  SARS-CoV-2 and other Sarbecovirus currently known to infect humans.  If clinically indicated additional testing with an alternate test  methodology [phone]) is advised. The SARS-CoV-2 RNA is generally  detectable in upper and lower respiratory sp ecimens during the acute  phase of infection. The expected result is Negative. Fact Sheet for Patients:  StrictlyIdeas.no Fact Sheet for Healthcare Providers: BankingDealers.co.za This test is not yet approved or cleared by the [place] FDA and has been authorized for detection and/or diagnosis of SARS-CoV-2 by FDA under an Emergency Use Authorization (EUA).  This EUA will remain in effect (meaning this test can be used) for the duration of the COVID-19 declaration under Section 564(b)(1) of the Act, 21 U.S.C. section 360bbb-3(b)(1), unless the authorization is terminated or revoked sooner. Performed at [facility], [place] [address], [place], [place] [address]   Culture, blood (routine x 2)     Status: None   Collection Time: 05/15/19  7:13 PM  Result Value Ref Range Status   Specimen Description BLOOD LEFT WRIST  Final   Special Requests   Final    BOTTLES DRAWN AEROBIC AND ANAEROBIC Blood Culture results may not be optimal due to an excessive volume of blood received in culture bottles   Culture   Final    NO GROWTH 5 DAYS Performed at [facility], [place] [address], [place], [place] [address]    Report Status 05/20/2019  FINAL  Final         Radiology Studies: No results found.      Scheduled Meds: . allopurinol  300 mg Oral Daily  . aspirin EC   81 mg Oral Daily  . docusate sodium  100 mg Oral BID  . ferrous sulfate  325 mg Oral Q breakfast  . gabapentin  300 mg Oral TID  . mirabegron ER  50 mg Oral Daily  . pantoprazole  40 mg Oral Daily  . potassium chloride SA  20 mEq Oral Daily  . rosuvastatin  40 mg Oral Daily  . sertraline  200 mg Oral Daily  . sodium chloride flush  3 mL Intravenous Once  . tamsulosin  0.4 mg Oral QHS   Continuous Infusions: . sodium chloride 75 mL/hr at 05/20/19 1722  . lactated ringers 10 mL/hr at 05/20/19 2209     LOS: 9 days    Time spent: over 30 min    Fayrene Helper, MD [facility] Pager AMION  If 7PM-7AM, please contact night-coverage [email or web address] Password [email or web address] 05/24/2019, 4:15 PM

## 2019-05-24 NOTE — Progress Notes (Signed)
Pt placed on home CPAP at this time.

## 2019-05-25 LAB — CBC
HCT: 33.1 % — ABNORMAL LOW (ref 39.0–52.0)
Hemoglobin: 10.6 g/dL — ABNORMAL LOW (ref 13.0–17.0)
MCH: 28.8 pg (ref 26.0–34.0)
MCHC: 32 g/dL (ref 30.0–36.0)
MCV: 89.9 fL (ref 80.0–100.0)
Platelets: 84 10*3/uL — ABNORMAL LOW (ref 150–400)
RBC: 3.68 MIL/uL — ABNORMAL LOW (ref 4.22–5.81)
RDW: 15.8 % — ABNORMAL HIGH (ref 11.5–15.5)
WBC: 5.8 10*3/uL (ref 4.0–10.5)
nRBC: 0 % (ref 0.0–0.2)

## 2019-05-25 LAB — COMPREHENSIVE METABOLIC PANEL
ALT: 21 U/L (ref 0–44)
AST: 35 U/L (ref 15–41)
Albumin: 3.3 g/dL — ABNORMAL LOW (ref 3.5–5.0)
Alkaline Phosphatase: 71 U/L (ref 38–126)
Anion gap: 7 (ref 5–15)
BUN: 28 mg/dL — ABNORMAL HIGH (ref 8–23)
CO2: 28 mmol/L (ref 22–32)
Calcium: 9.6 mg/dL (ref 8.9–10.3)
Chloride: 105 mmol/L (ref 98–111)
Creatinine, Ser: 1.35 mg/dL — ABNORMAL HIGH (ref 0.61–1.24)
GFR calc Af Amer: 59 mL/min — ABNORMAL LOW (ref >60)
GFR calc non Af Amer: 51 mL/min — ABNORMAL LOW (ref >60)
Glucose, Bld: 97 mg/dL (ref 70–99)
Potassium: 4.6 mmol/L (ref 3.5–5.1)
Sodium: 140 mmol/L (ref 135–145)
Total Bilirubin: 0.7 mg/dL (ref 0.3–1.2)
Total Protein: 7.3 g/dL (ref 6.5–8.1)

## 2019-05-25 LAB — MAGNESIUM: Magnesium: 2.1 mg/dL (ref 1.7–2.4)

## 2019-05-25 NOTE — TOC Initial Note (Signed)
Transition of Care (TOC) - Initial/Assessment Note    Patient Details  Name: [name]. MRN: [id number] Date of Birth: May 11, 1941  Transition of Care [facility]) CM/SW Contact:    Bary Castilla, LCSW Phone Number:[phone] 05/25/2019, 11:09 AM  Clinical Narrative:                 CSW met with patient bedside to discuss the recommendation of a SNF Patient informed CSW that he was willing to go to a SNF. CSW provided patient with bed list and asked him if he had a preference. Patient informed CSW that he would like the SNF decision to be made by his wife. CSW spoke to pt's wife and she inquired about inpatient rehab and would prefer him to go there. CSW informed her that is a recommendation made by OT/PT. Pt's wife asked if that he could be assessed for inpatient. CSW informed her that she would follow up with doctor.Pt's wife stated that if a SNF is the option, she preferred [facility] she does not want [facility]. She is also requesting a private room. She agreed that CSW could fax out to other facilities.  CSW spoke to Dr.Powell and informed him of wife's request. He stated that he would follow up with OT/PT.  Expected Discharge Plan: Skilled Nursing Facility Barriers to Discharge: SNF Pending bed offer, SNF Pending discharge orders, Continued Medical Work up   Patient Goals and CMS Choice Patient states their goals for this hospitalization and ongoing recovery are:: to be able to retunr home CMS Medicare.gov Compare Post Acute Care list provided to:: Patient Choice offered to / list presented to : Patient  Expected Discharge Plan and Services Expected Discharge Plan: [place] In-house Referral: [facility] Discharge Planning Services: CM Consult Post Acute Care Choice: NA Living arrangements for the past 2 months: Single Family Home                 DME Arranged: N/A DME Agency: NA       HH Arranged: RN, Disease Management, PT, Nurse's Aide [place] Agency: [facility]) Date HH Agency Contacted: 05/19/19 Time [place]: [id number] Representative spoke with at [place]: Hydrographic surveyor  Prior Living Arrangements/Services Living arrangements for the past 2 months: [place] with:: Spouse Patient language and need for interpreter reviewed:: Yes Do you feel safe going back to the place where you live?: Yes      Need for Family Participation in Patient Care: Yes (Comment) Care giver support system in place?: Yes (comment)   Criminal Activity/Legal Involvement Pertinent to Current Situation/Hospitalization: No - Comment as needed  Activities of Daily Living Home Assistive Devices/Equipment: Cane (specify quad or straight)(straight) ADL Screening (condition at time of admission) Patient's cognitive ability adequate to safely complete daily activities?: Yes Is the patient deaf or have difficulty hearing?: No Does the patient have difficulty seeing, even when wearing glasses/contacts?: No Does the patient have difficulty concentrating, remembering, or making decisions?: No Patient able to express need for assistance with ADLs?: Yes Does the patient have difficulty dressing or bathing?: No Independently performs ADLs?: Yes (appropriate for developmental age) Does the patient have difficulty walking or climbing stairs?: Yes Weakness of Legs: Both Weakness of Arms/Hands: None  Permission Sought/Granted Permission sought to share information with : Facility Sport and exercise psychologist, Family Supports Permission granted to share information with : Yes, Verbal Permission Granted  Share Information with NAME: spouse Vickie  Permission granted to share  info w AGENCY: SNFs     Permission granted to share info w Contact Information: (Vickie wife)  Emotional Assessment Appearance:: Developmentally appropriate Attitude/Demeanor/Rapport: Gracious, Engaged Affect (typically observed): Accepting Orientation: : Oriented to Self, Oriented to   Time, Oriented to Place, Oriented to Situation Alcohol / Substance Use: Not Applicable Psych Involvement: No (comment)  Admission diagnosis:  SOB (shortness of breath) [R06.02] Cellulitis of fourth toe of left foot [L03.032] Acute right heart failure ([place]) [I50.811] Patient Active Problem List   Diagnosis Date Noted  . 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 Toe infection 05/17/2019  . DOE (dyspnea on exertion) 05/15/2019  . Severe tricuspid regurgitation 05/15/2019  . Acute right heart failure ([place]) 05/15/2019  . Cellulitis of fourth toe of left foot   . Excessive daytime sleepiness 02/11/2019  . 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[place]) 02/11/2019  . Iron deficiency anemia 02/04/2019  . Poor memory 12/16/2018  . Deficiency anemia 11/19/2018  . Other fatigue 11/19/2018  . History of elevated PSA 11/19/2018  . Prostate cancer screening 11/19/2018  . Prostate CA ([place]) 11/19/2018  . Pancytopenia, acquired ([place]) 08/13/2018  . Recurrent falls 08/13/2018  . 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 Peripheral neuropathy 07/31/2018  . 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 Anemia, chronic disease 01/28/2018  . History of skin cancer 07/27/2017  . Fatty liver disease, nonalcoholic 81/44/8185  . Thrombocytopenia ([place]) 11/27/2016  . Lung nodule 04/23/2014  . Postinflammatory pulmonary fibrosis ([place]) 06/13/2013  . Long term (current) use of anticoagulants 11/22/2011  . Abdominal bruit 03/07/2011  . Pacemaker-St.Jude   . Hx of CABG   . Hyperlipidemia type II   . OSA (obstructive sleep apnea)   . PACEMAKER, PERMANENT 08/17/2010  . DIABETES MELLITUS 08/16/2010  . HYPERLIPIDEMIA 08/16/2010  . Obesity 08/16/2010  . Essential hypertension 08/16/2010  . Atrial fibrillation ([place]) 08/16/2010  . IRREGULAR HEART RATE 08/16/2010  . Sleep apnea 08/16/2010  . BENIGN PROSTATIC HYPERTROPHY, HX OF 08/16/2010   PCP:  [name], MD Pharmacy:   [id number] [place] [place]Phone: [phone]: [phone]    Social Determinants of Health (SDOH) Interventions    Readmission Risk Interventions No flowsheet data found.

## 2019-05-25 NOTE — NC FL2 (Signed)
[place] LEVEL OF CARE SCREENING TOOL     IDENTIFICATION  Patient Name: [name]. Birthdate: May 21, 1941 Sex: male Admission Date (Current Location): 05/15/2019  [place] and [place] Number:  Herbalist and Address:  [facility] [place]. [facility], [place] [address], [place], [place] [address]      Provider Number:    Attending Physician Name and Address:  Elodia Florence., *  Relative Name and Phone Number:  [place], [place]    Current Level of Care: Hospital Recommended Level of Care: [place] Prior Approval Number:    Date Approved/Denied:   PASRR Number: [id number]  Discharge Plan: SNF    Current Diagnoses: Patient Active Problem List   Diagnosis Date Noted  . 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 Toe infection 05/17/2019  . DOE (dyspnea on exertion) 05/15/2019  . Severe tricuspid regurgitation 05/15/2019  . Acute right heart failure ([place]) 05/15/2019  . Cellulitis of fourth toe of left foot   . Excessive daytime sleepiness 02/11/2019  . 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[place]) 02/11/2019  . Iron deficiency anemia 02/04/2019  . Poor memory 12/16/2018  . Deficiency anemia 11/19/2018  . Other fatigue 11/19/2018  . History of elevated PSA 11/19/2018  . Prostate cancer screening 11/19/2018  . Prostate CA ([place]) 11/19/2018  . Pancytopenia, acquired ([place]) 08/13/2018  . Recurrent falls 08/13/2018  . 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 Peripheral neuropathy 07/31/2018  . 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 Anemia, chronic disease 01/28/2018  . History of skin cancer 07/27/2017  . Fatty liver disease, nonalcoholic 46/50/3546  . Thrombocytopenia ([place]) 11/27/2016  . Lung nodule 04/23/2014  . Postinflammatory pulmonary fibrosis ([place]) 06/13/2013  . Long term (current) use of anticoagulants 11/22/2011  . Abdominal bruit  03/07/2011  . Pacemaker-St.Jude   . Hx of CABG   . Hyperlipidemia type II   . OSA (obstructive sleep apnea)   . PACEMAKER, PERMANENT 08/17/2010  . DIABETES MELLITUS 08/16/2010  . HYPERLIPIDEMIA 08/16/2010  . Obesity 08/16/2010  . Essential hypertension 08/16/2010  . Atrial fibrillation ([place]) 08/16/2010  . IRREGULAR HEART RATE 08/16/2010  . Sleep apnea 08/16/2010  . BENIGN PROSTATIC HYPERTROPHY, HX OF 08/16/2010    Orientation RESPIRATION BLADDER Height & Weight     Self, Time, Situation, Place  Other (Comment)(CPAP / Adult Large-  home machine) External catheter, Incontinent Weight: 176 lb (79.8 kg) Height:  5' 8.5" (174 cm)  BEHAVIORAL SYMPTOMS/MOOD NEUROLOGICAL BOWEL NUTRITION STATUS      Continent Diet(please see discharge summary )  AMBULATORY STATUS COMMUNICATION OF NEEDS Skin     Verbally Skin abrasions, Surgical wounds(incision(closed)foot,leg; Abrasion, face, mild;  Eccymosis, arm, back,leg; Exocriated, bilaterial , buttocks , leg)                       Personal Care Assistance Level of Assistance  Bathing, Feeding, Dressing Bathing Assistance: Limited assistance Feeding assistance: Independent Dressing Assistance: Limited assistance     Functional Limitations Info  Sight, Hearing, Speech Sight Info: Adequate Hearing Info: Impaired Speech Info: Adequate    SPECIAL CARE FACTORS FREQUENCY  PT (By licensed PT), OT (By licensed OT)     PT Frequency: 3x per week OT Frequency: 3xper week             Contractures Contractures Info: Not present    Additional Factors Info  Code Status, Allergies Code Status Info:  FULL Allergies Info: Lipitor, Nsaids, Warfarin And Related, Allopurinol, Enbrel           Current Medications (05/25/2019):  This is the current hospital active medication list Current Facility-Administered Medications  Medication Dose Route Frequency Provider Last Rate Last Dose  . 0.9 %  sodium chloride infusion   Intravenous PRN Cherylann Ratel A, DO 75 mL/hr at 05/20/19 1722    . acetaminophen (TYLENOL) tablet 650 mg  650 mg Oral Q6H PRN Meredith Pel, MD   650 mg at 05/17/19 0730   Or  . acetaminophen (TYLENOL) suppository 650 mg  650 mg Rectal Q6H PRN Meredith Pel, MD      . allopurinol (ZYLOPRIM) tablet 300 mg  300 mg Oral Daily Meredith Pel, MD   300 mg at 05/25/19 0911  . aspirin EC tablet 81 mg  81 mg Oral Daily Rosalin Hawking, MD   81 mg at 05/25/19 0910  . docusate sodium (COLACE) capsule 100 mg  100 mg Oral BID Meredith Pel, MD   100 mg at 05/24/19 2154  . ferrous sulfate tablet 325 mg  325 mg Oral Q breakfast Meredith Pel, MD   325 mg at 05/25/19 0911  . gabapentin (NEURONTIN) capsule 300 mg  300 mg Oral TID Meredith Pel, MD   300 mg at 05/25/19 0911  . HYDROcodone-acetaminophen (NORCO/VICODIN) 5-325 MG per tablet 1-2 tablet  1-2 tablet Oral Q4H PRN Meredith Pel, MD   2 tablet at 05/19/19 0238  . lactated ringers infusion   Intravenous Continuous Meredith Pel, MD 10 mL/hr at 05/20/19 2209    . metoCLOPramide (REGLAN) tablet 5-10 mg  5-10 mg Oral Q8H PRN Meredith Pel, MD       Or  . metoCLOPramide (REGLAN) injection 5-10 mg  5-10 mg Intravenous Q8H PRN Meredith Pel, MD      . mirabegron ER [facility]) tablet 50 mg  50 mg Oral Daily Meredith Pel, MD   50 mg at 05/25/19 0955  . ondansetron (ZOFRAN) tablet 4 mg  4 mg Oral Q6H PRN Meredith Pel, MD       Or  . ondansetron [facility]) injection 4 mg  4 mg Intravenous Q6H PRN Meredith Pel, MD      . pantoprazole (PROTONIX) EC tablet 40 mg  40 mg Oral Daily Meredith Pel, MD   40 mg at 05/25/19 0912  . potassium chloride SA (K-DUR) CR tablet 20 mEq  20 mEq Oral Daily Meredith Pel, MD   20 mEq at 05/25/19 0911  . rosuvastatin (CRESTOR) tablet 40 mg  40 mg Oral Daily Meredith Pel, MD   40 mg at 05/25/19 0911  . sertraline (ZOLOFT) tablet 200 mg  200 mg Oral Daily Meredith Pel, MD   200 mg at 05/25/19 0910  . sodium chloride flush (NS) 0.9 % injection 3 mL  3 mL Intravenous Once Meredith Pel, MD      . tamsulosin [facility]) capsule 0.4 mg  0.4 mg Oral QHS Meredith Pel, MD   0.4 mg at 05/24/19 2154     Discharge Medications: Please see discharge summary for a list of discharge medications.  Relevant Imaging Results:  Relevant Lab Results:   Additional Information SSN [id number]  Vinie Sill, [place]

## 2019-05-25 NOTE — Plan of Care (Signed)
  Problem: Clinical Measurements: Goal: Diagnostic test results will improve Outcome: Progressing Goal: Respiratory complications will improve Outcome: Progressing Goal: Cardiovascular complication will be avoided Outcome: Progressing   Problem: Nutrition: Goal: Adequate nutrition will be maintained Outcome: Progressing   Problem: Coping: Goal: Level of anxiety will decrease Outcome: Progressing

## 2019-05-25 NOTE — Progress Notes (Addendum)
**Note De-Identified vi Obfusction** PROGRESS NOTE    [name].  WYO:[id number] DOB: 05-20-1941 DO: 05/15/2019 PCP: Jamie Pretty, MD   Brief Nrrtive:  Jamie Richardsonis  78 y.o.mlewithhistory of CD sttus post CBG sttus post mze procedure, pcemker plcement for brdycrdi . fib, chronic nemi nd thrombocytopeni nd possible cirrhosis presented to the crdiology office for follow-up while incresing shortness of breth which hs been ongoing for lst few months. Ptient sttes his shortness of breth further worsened cutely lst few dys. Hs been  incresing lower extremity edem extending up to the thighs. Hs noticed incresing erythem nd discolortion of the left fourth toe lst few dys. Hs been hving frequent flls. Denies hitting his hed or losing consciousness. Denies chest pin productive cough fever chills bdominl pin  ssessment & Pln:   Principl Problem:   cute right hert filure (HCC) ctive Problems:   tril fibrilltion (HCC)   Sleep pne   Pcemker-St.Jude   Hx of CBG   Postinflmmtory pulmonry fibrosis (HCC)   Thrombocytopeni (HCC)   History of primry testiculr cncer   Lewy body dementi with behviorl disturbnce (HCC)   Toe infection   Cerebrl thrombosis with cerebrl infrction   Diplopi: pt with double vision tht seems to hve strted sometime 6/2.  Exm with txi worse on R, double vision which ws subjectively worse on L eye.  Initilly plnned for MRI/MR, but pt with pcemker which is not MRI comptible.   Symptoms pper to hve mostly resolved tody. pprecite neurology recommendtions - concerning for brinstem infrct Will defer decision regrding ntipltelet/nticogultion to neurology given his hx of spontneous subdurl hemorrhge LDL 51, continue crestor 1c 5.6 Echo done lst week (EF 50-55%, see report) Bilterl IC's with 1-39% stenosis Trnscrnil doppler norml  Hed CT - stble nd negtive Neurology c/s,  pprecite recs  cute right hert filure  Echo from 5/30 with EF 50-55%, septl hypokinesis, RV not well visulized, cvity modertely enlrged (mildly reduced function of RV) - see report Crdiology c/s - signed off on 5/30, recommended d/c on lsix 80 mg BID Currently metoprolol, lsix, nd spironolctone on hold given hypotension/brdycrdi (noted 6/2 M) EKG 6/6 shows fib with SVR, continue to hold metop Net negtive 7.5 L I/O, dily weights ppers euvolemic t this time, will continue to monitor  Esophgel Dysphgi: F/u with GI outptient esophgel precutions (smller mels more frequently, upright when eting/drinking, smller bites/sips)  Lower extremity cellulitis with possible ischemic fourth toe of the left foot. S/p L 4th toe mputtion through the proximl phlnx on 5/30 bx d/c'd on 6/1 BI's wnl PT currently recommending SNF  Cirrhosis of the liver No scites by [place] Follow INR.  He does hve low pltelets.  lbumin mildly low, bili mildly elevted. 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 Delirium prec  . fib Metoprolol on hold Not on nticogultion secondry history of spontneous subdurl hemtom in 2013.  Sleep pne -on CPPt night  Deconditioning - PT evl; likely relted to HF - rec SNF  DVT prophylxis: SCD Code Sttus: full  Fmily Communiction: none t bedside - discussed with wife over phone 6/7 Disposition Pln: initilly plnned for home with home helth, but wife decided she's likely unble to cre for **Note De-Identified vi Obfusction** him nd now thinking bout SNF.  Pending sfe d/c pln nd SNF.  CIR evl requested per wife.   Consultnts:   Neurology   Crdiology  orthopedics  Procedures:  L toe mputtion 5/30 Echo IMPRESSIONS    1. The left ventricle hs low norml systolic function, with n ejection frction of 50-55%. The cvity size ws norml. Left ventriculr distolic Doppler prmeters re indeterminte.  2. LVEF is pproximtely 50 to 55% with septl hypokinesis. Poor.  3. The right ventricle ws not well visulized. The cvity ws modertely enlrged. There is no increse in right ventriculr wll thickness.  4. RV is difficult to visulize, even with Definity It ppers dilted nd function is t lest mildly reduced.  5. The mitrl vlve is bnorml. Mild thickening of the mitrl vlve leflet.  6. The tricuspid vlve is grossly norml. Tricuspid vlve regurgittion is severe.  7. The ortic vlve ws not well visulized. Mild thickening of the ortic vlve. Mild clcifiction of the ortic vlve.  8. The inferior ven cv ws dilted in size with <50% respirtory vribility.  9. The intertril septum ws not ssessed.  Antimicrobils:  Anti-infectives (From dmission, onwrd)   Strt     Dose/Rte Route Frequency Ordered Stop   05/19/19 2030  vncomycin (VANCOCIN) IVPB 750 mg/150 ml premix  Sttus:  Discontinued     750 mg 150 mL/hr over 60 Minutes Intrvenous Every 24 hours 05/19/19 1224 05/20/19 0556   05/16/19 2030  vncomycin (VANCOCIN) IVPB 1000 mg/200 mL premix  Sttus:  Discontinued     1,000 mg 200 mL/hr over 60 Minutes Intrvenous Every 24 hours 05/15/19 2005 05/19/19 1224   05/16/19 2000  ceFEPIme (MAXIPIME) 2 g in sodium chloride 0.9 % 100 mL IVPB  Sttus:  Discontinued     2 g 200 mL/hr over 30 Minutes Intrvenous Every 12 hours 05/16/19 0610 05/20/19 0557   05/16/19 0615  ceFEPIme (MAXIPIME) 2 g in sodium chloride 0.9 % 100 mL IVPB     2 g 200 mL/hr over 30 Minutes Intrvenous  Once 05/16/19 0610 05/16/19 1024   05/15/19 2015  vncomycin (VANCOCIN) IVPB 750 mg/150 ml premix     750 mg 150  mL/hr over 60 Minutes Intrvenous NOW 05/15/19 2002 05/16/19 0016   05/15/19 1815  vncomycin (VANCOCIN) IVPB 1000 mg/200 mL premix     1,000 mg 200 mL/hr over 60 Minutes Intrvenous  Once 05/15/19 1802 05/15/19 2011     Subjective: No complints.   Objective: Vitls:   05/24/19 0842 05/24/19 1133 05/25/19 0729 05/25/19 0909  BP: 105/79 (!) 99/58 123/69 112/89  Pulse: (!) 108 (!) 55 (!) 53 60  Resp: 18 16 18 18   Temp:  99.1 F (37.3 C) 98.7 F (37.1 C)   TempSrc:  Orl Orl   SpO2: 97% 99% 99% 98%  Weight:   79.8 kg   Height:        Intke/Output Summry (Lst 24 hours) t 05/25/2019 1347 Lst dt filed t 05/25/2019 0949 Gross per 24 hour  Intke 1074 ml  Output 600 ml  Net 474 ml   Filed Weights   05/23/19 0425 05/24/19 0444 05/25/19 0729  Weight: 83.5 kg 83.4 kg 79.8 kg    Exmintion:  Generl: No cute distress. Crdiovsculr: Hert sounds show  regulr rte, nd rhythm. Lungs: Cler to usculttion bilterlly  Abdomen: Soft, nontender, nondistended  Neurologicl: Alert nd oriented 3. Moves ll extremities 4. Crnil nerves II through XII grossly intct. Skin: Wrm nd dry.  No rashes or lesions. Extremities: No clubbing or cyanosis. No edema.  Psychiatric: Mood and affect are normal. Insight and judgment are appropriate.    Data Reviewed: I have personally reviewed following labs and imaging studies  CBC: Recent Labs  Lab 05/19/19 0643 05/20/19 0609 05/21/19 0433 05/22/19 0338 05/23/19 0449 05/24/19 0518 05/25/19 0533  WBC 5.8 5.2 4.9 5.3 5.0 5.8 5.8  NEUTROBS 3.7 3.7 3.3  --   --   --   --   HGB 9.6* 10.3* 10.8* 10.4* 10.5* 10.1* 10.6*  HCT 30.2* 33.2* 34.4* 33.6* 33.5* 31.5* 33.1*  MCV 90.4 91.5 91.2 90.6 91.0 89.7 89.9  PLT 95* 97* 90* 92* 87* 91* 84*   Basic Metabolic Panel: Recent Labs  Lab 05/19/19 0643 05/20/19 0609 05/21/19 0433 05/22/19 0338 05/22/19 1011 05/23/19 0449 05/24/19 0518 05/25/19 0533  N 140 141 142 142  --   140 140 140  K 4.1 4.6 4.4 5.4* 4.4 4.4 4.6 4.6  CL 100 101 102 104  --  104 105 105  CO2 30 33* 30 30  --  28 26 28   GLUCOSE 96 94 96 105*  --  98 93 97  BUN 32* 39* 41* 41*  --  38* 35* 28*  CRETININE 1.69* 1.83* 1.82* 1.86*  --  1.66* 1.49* 1.35*  CLCIUM 9.1 9.3 9.4 9.7  --  9.3 9.3 9.6  MG 2.0 2.1 2.2 2.3  --  2.2  --  2.1  PHOS 4.0 4.2 3.7  --   --   --   --   --    GFR: Estimated Creatinine Clearance: 45.8 mL/min () (by C-G formula based on SCr of 1.35 mg/dL (H)). Liver Function Tests: Recent Labs  Lab 05/21/19 0433 05/22/19 0338 05/23/19 0449 05/24/19 0518 05/25/19 0533  ST  --  31 29 30  35  LT  --  18 20 19 21   LKPHOS  --  74 73 64 71  BILITOT  --  0.4 0.6 0.9 0.7  PROT  --  7.3 7.8 7.3 7.3  LBUMIN 3.4* 3.4* 3.5 3.4* 3.3*   No results for input(s): LIPSE, MYLSE in the last 168 hours. No results for input(s): MMONI in the last 168 hours. Coagulation Profile: Recent Labs  Lab 05/21/19 1333 05/22/19 0338  INR 1.3* 1.3*   Cardiac Enzymes: No results for input(s): CKTOTL, CKMB, CKMBINDEX, TROPONINI in the last 168 hours. BNP (last 3 results) No results for input(s): PROBNP in the last 8760 hours. Hb1C: No results for input(s): HGB1C in the last 72 hours. CBG: Recent Labs  Lab 05/20/19 2121  GLUCP 146*   Lipid Profile: No results for input(s): CHOL, HDL, LDLCLC, TRIG, CHOLHDL, LDLDIRECT in the last 72 hours. Thyroid Function Tests: No results for input(s): TSH, T4TOTL, FREET4, T3FREE, THYROIDB in the last 72 hours. nemia Panel: No results for input(s): VITMINB12, FOLTE, FERRITIN, TIBC, IRON, RETICCTPCT in the last 72 hours. Sepsis Labs: No results for input(s): PROCLCITON, LTICCIDVEN in the last 168 hours.  Recent Results (from the past 240 hour(s))  Culture, blood (routine x 2)     Status: None   Collection Time: 05/15/19  6:13 PM  Result Value Ref Range Status   Specimen Description BLOOD RIGHT NTECUBITL  Final   Special  Requests   Final    BOTTLES DRWN EROBIC ND NEROBIC Blood Culture adequate volume   Culture   Final    NO GROWTH 5 DYS Performed at [facility], [address] [place], [place] [address]    Report Status 05/20/2019 FINL  Final  SRS Coronavirus 2 (CEPHEID - Performed in [place] hospital lab), Hosp Order     Status: None   Collection Time: 05/15/19  6:22 PM  Result Value Ref Range Status   SRS Coronavirus 2 NEGTIVE NEGTIVE Final    Comment: (NOTE) If result is NEGTIVE SRS-CoV-2 target nucleic acids are NOT DETECTED. The SRS-CoV-2 RN is generally detectable in upper and lower  respiratory specimens during the acute phase of infection. The lowest  concentration of SRS-CoV-2 viral copies this assay can detect is 250  copies / mL.  negative result does not preclude SRS-CoV-2 infection  and should not be used as the sole basis for treatment or other  patient management decisions.   negative result may occur with  improper specimen collection / handling, submission of specimen other  than nasopharyngeal swab, presence of viral mutation(s) within the  areas targeted by this assay, and inadequate number of viral copies  (<250 copies / mL).  negative result must be combined with clinical  observations, patient history, and epidemiological information. If result is POSITIVE SRS-CoV-2 target nucleic acids are DETECTED. The SRS-CoV-2 RN is generally detectable in upper and lower  respiratory specimens dur ing the acute phase of infection.  Positive  results are indicative of active infection with SRS-CoV-2.  Clinical  correlation with patient history and other diagnostic information is  necessary to determine patient infection status.  Positive results do  not rule out bacterial infection or co-infection with other viruses. If result is PRESUMPTIVE POSTIVE SRS-CoV-2 nucleic acids MY BE PRESENT.    presumptive positive result was obtained on the submitted  specimen  and confirmed on repeat testing.  While 2019 novel coronavirus  (SRS-CoV-2) nucleic acids may be present in the submitted sample  additional confirmatory testing may be necessary for epidemiological  and / or clinical management purposes  to differentiate between  SRS-CoV-2 and other Sarbecovirus currently known to infect humans.  If clinically indicated additional testing with an alternate test  methodology [phone]) is advised. The SRS-CoV-2 RN is generally  detectable in upper and lower respiratory sp ecimens during the acute  phase of infection. The expected result is Negative. Fact Sheet for Patients:  StrictlyIdeas.no Fact Sheet for Healthcare Providers: BankingDealers.co.za This test is not yet approved or cleared by the [place] FD and has been authorized for detection and/or diagnosis of SRS-CoV-2 by FD under an Emergency Use uthorization (EU).  This EU will remain in effect (meaning this test can be used) for the duration of the COVID-19 declaration under Section 564(b)(1) of the ct, 21 U.S.C. section 360bbb-3(b)(1), unless the authorization is terminated or revoked sooner. Performed at [facility], [place] [address] [place], [place] [address]   Culture, blood (routine x 2)     Status: None   Collection Time: 05/15/19  7:13 PM  Result Value Ref Range Status   Specimen Description BLOOD LEFT WRIST  Final   Special Requests   Final    BOTTLES DRWN EROBIC ND NEROBIC Blood Culture results may not be optimal due to an excessive volume of blood received in culture bottles   Culture   Final    NO GROWTH 5 DYS Performed at [facility], [place] [address] [place], [place] [address]    Report Status 05/20/2019 FINL  Final         Radiology Studies: No results found.  Scheduled Meds: . allopurinol  300 mg Oral Daily  . aspirin EC  81 mg Oral Daily  . docusate sodium  100 mg  Oral BID  . ferrous sulfate  325 mg Oral Q breakfast  . gabapentin  300 mg Oral TID  . mirabegron ER  50 mg Oral Daily  . pantoprazole  40 mg Oral Daily  . potassium chloride SA  20 mEq Oral Daily  . rosuvastatin  40 mg Oral Daily  . sertraline  200 mg Oral Daily  . sodium chloride flush  3 mL Intravenous Once  . tamsulosin  0.4 mg Oral QHS   Continuous Infusions: . sodium chloride 75 mL/hr at 05/20/19 1722  . lactated ringers 10 mL/hr at 05/20/19 2209     LOS: 10 days    Time spent: over 1 min    Fayrene Helper, MD [facility] Pager [email or web address]  If 7PM-7AM, please contact night-coverage [email or web address] Password [email or web address] 05/25/2019, 1:47 PM

## 2019-05-26 ENCOUNTER — Other Ambulatory Visit: Payer: Self-pay

## 2019-05-26 ENCOUNTER — Encounter (HOSPITAL_COMMUNITY): Payer: Self-pay | Admitting: *Deleted

## 2019-05-26 ENCOUNTER — Inpatient Hospital Stay (HOSPITAL_COMMUNITY)
Admission: RE | Admit: 2019-05-26 | Discharge: 2019-06-04 | DRG: 945 | Disposition: A | Discharge status: Home Health | Payer: Medicare Other | Source: Intra-hospital | Attending: Physical Medicine & Rehabilitation | Admitting: Physical Medicine & Rehabilitation

## 2019-05-26 DIAGNOSIS — D472 Monoclonal gammopathy: Secondary | ICD-10-CM | POA: Diagnosis present

## 2019-05-26 DIAGNOSIS — I5043 Acute on chronic combined systolic (congestive) and diastolic (congestive) heart failure: Secondary | ICD-10-CM | POA: Diagnosis present

## 2019-05-26 DIAGNOSIS — K7460 Unspecified cirrhosis of liver: Secondary | ICD-10-CM | POA: Diagnosis present

## 2019-05-26 DIAGNOSIS — I071 Rheumatic tricuspid insufficiency: Secondary | ICD-10-CM | POA: Diagnosis present

## 2019-05-26 DIAGNOSIS — G3183 Dementia with Lewy bodies: Secondary | ICD-10-CM | POA: Diagnosis present

## 2019-05-26 DIAGNOSIS — I5032 Chronic diastolic (congestive) heart failure: Secondary | ICD-10-CM | POA: Diagnosis not present

## 2019-05-26 DIAGNOSIS — N400 Enlarged prostate without lower urinary tract symptoms: Secondary | ICD-10-CM | POA: Diagnosis present

## 2019-05-26 DIAGNOSIS — Z951 Presence of aortocoronary bypass graft: Secondary | ICD-10-CM | POA: Diagnosis not present

## 2019-05-26 DIAGNOSIS — D61818 Other pancytopenia: Secondary | ICD-10-CM | POA: Diagnosis present

## 2019-05-26 DIAGNOSIS — N170 Acute kidney failure with tubular necrosis: Secondary | ICD-10-CM | POA: Diagnosis not present

## 2019-05-26 DIAGNOSIS — E785 Hyperlipidemia, unspecified: Secondary | ICD-10-CM | POA: Diagnosis present

## 2019-05-26 DIAGNOSIS — E1169 Type 2 diabetes mellitus with other specified complication: Secondary | ICD-10-CM

## 2019-05-26 DIAGNOSIS — G629 Polyneuropathy, unspecified: Secondary | ICD-10-CM | POA: Diagnosis present

## 2019-05-26 DIAGNOSIS — G4733 Obstructive sleep apnea (adult) (pediatric): Secondary | ICD-10-CM | POA: Diagnosis present

## 2019-05-26 DIAGNOSIS — E669 Obesity, unspecified: Secondary | ICD-10-CM

## 2019-05-26 DIAGNOSIS — Z89412 Acquired absence of left great toe: Secondary | ICD-10-CM | POA: Diagnosis not present

## 2019-05-26 DIAGNOSIS — I5031 Acute diastolic (congestive) heart failure: Secondary | ICD-10-CM

## 2019-05-26 DIAGNOSIS — I4891 Unspecified atrial fibrillation: Secondary | ICD-10-CM | POA: Diagnosis present

## 2019-05-26 DIAGNOSIS — Z950 Presence of cardiac pacemaker: Secondary | ICD-10-CM

## 2019-05-26 DIAGNOSIS — Z8739 Personal history of other diseases of the musculoskeletal system and connective tissue: Secondary | ICD-10-CM | POA: Diagnosis not present

## 2019-05-26 DIAGNOSIS — I69398 Other sequelae of cerebral infarction: Secondary | ICD-10-CM | POA: Diagnosis not present

## 2019-05-26 DIAGNOSIS — I50813 Acute on chronic right heart failure: Secondary | ICD-10-CM | POA: Diagnosis not present

## 2019-05-26 DIAGNOSIS — I2510 Atherosclerotic heart disease of native coronary artery without angina pectoris: Secondary | ICD-10-CM | POA: Diagnosis not present

## 2019-05-26 DIAGNOSIS — E119 Type 2 diabetes mellitus without complications: Secondary | ICD-10-CM | POA: Diagnosis present

## 2019-05-26 DIAGNOSIS — J8410 Pulmonary fibrosis, unspecified: Secondary | ICD-10-CM | POA: Diagnosis present

## 2019-05-26 DIAGNOSIS — I50812 Chronic right heart failure: Secondary | ICD-10-CM | POA: Diagnosis present

## 2019-05-26 DIAGNOSIS — R296 Repeated falls: Secondary | ICD-10-CM | POA: Diagnosis present

## 2019-05-26 DIAGNOSIS — I5033 Acute on chronic diastolic (congestive) heart failure: Secondary | ICD-10-CM | POA: Diagnosis not present

## 2019-05-26 DIAGNOSIS — R5381 Other malaise: Secondary | ICD-10-CM | POA: Diagnosis not present

## 2019-05-26 DIAGNOSIS — Z820 Family history of epilepsy and other diseases of the nervous system: Secondary | ICD-10-CM

## 2019-05-26 DIAGNOSIS — I480 Paroxysmal atrial fibrillation: Secondary | ICD-10-CM | POA: Diagnosis not present

## 2019-05-26 DIAGNOSIS — I110 Hypertensive heart disease with heart failure: Secondary | ICD-10-CM | POA: Diagnosis present

## 2019-05-26 DIAGNOSIS — Z8249 Family history of ischemic heart disease and other diseases of the circulatory system: Secondary | ICD-10-CM

## 2019-05-26 DIAGNOSIS — Z87891 Personal history of nicotine dependence: Secondary | ICD-10-CM

## 2019-05-26 DIAGNOSIS — M109 Gout, unspecified: Secondary | ICD-10-CM | POA: Diagnosis present

## 2019-05-26 DIAGNOSIS — F0280 Dementia in other diseases classified elsewhere without behavioral disturbance: Secondary | ICD-10-CM | POA: Diagnosis present

## 2019-05-26 DIAGNOSIS — I639 Cerebral infarction, unspecified: Secondary | ICD-10-CM

## 2019-05-26 DIAGNOSIS — R600 Localized edema: Secondary | ICD-10-CM

## 2019-05-26 DIAGNOSIS — I25119 Atherosclerotic heart disease of native coronary artery with unspecified angina pectoris: Secondary | ICD-10-CM | POA: Diagnosis not present

## 2019-05-26 LAB — COMPREHENSIVE METABOLIC PANEL
ALT: 21 U/L (ref 0–44)
AST: 35 U/L (ref 15–41)
Albumin: 3.2 g/dL — ABNORMAL LOW (ref 3.5–5.0)
Alkaline Phosphatase: 75 U/L (ref 38–126)
Anion gap: 8 (ref 5–15)
BUN: 26 mg/dL — ABNORMAL HIGH (ref 8–23)
CO2: 25 mmol/L (ref 22–32)
Calcium: 9.5 mg/dL (ref 8.9–10.3)
Chloride: 106 mmol/L (ref 98–111)
Creatinine, Ser: 1.26 mg/dL — ABNORMAL HIGH (ref 0.61–1.24)
GFR calc Af Amer: >60 mL/min (ref >60)
GFR calc non Af Amer: 55 mL/min — ABNORMAL LOW (ref >60)
Glucose, Bld: 99 mg/dL (ref 70–99)
Potassium: 4.4 mmol/L (ref 3.5–5.1)
Sodium: 139 mmol/L (ref 135–145)
Total Bilirubin: 0.8 mg/dL (ref 0.3–1.2)
Total Protein: 7.1 g/dL (ref 6.5–8.1)

## 2019-05-26 LAB — CBC
HCT: 32.6 % — ABNORMAL LOW (ref 39.0–52.0)
Hemoglobin: 10.2 g/dL — ABNORMAL LOW (ref 13.0–17.0)
MCH: 28.2 pg (ref 26.0–34.0)
MCHC: 31.3 g/dL (ref 30.0–36.0)
MCV: 90.1 fL (ref 80.0–100.0)
Platelets: 87 10*3/uL — ABNORMAL LOW (ref 150–400)
RBC: 3.62 MIL/uL — ABNORMAL LOW (ref 4.22–5.81)
RDW: 15.7 % — ABNORMAL HIGH (ref 11.5–15.5)
WBC: 5.5 10*3/uL (ref 4.0–10.5)
nRBC: 0 % (ref 0.0–0.2)

## 2019-05-26 LAB — MAGNESIUM: Magnesium: 2 mg/dL (ref 1.7–2.4)

## 2019-05-26 LAB — GLUCOSE, CAPILLARY
Glucose-Capillary: 90 mg/dL (ref 70–99)
Glucose-Capillary: 121 mg/dL — ABNORMAL HIGH (ref 70–99)

## 2019-05-26 MED ORDER — ACETAMINOPHEN 325 MG PO TABS: 325.0000 mg | ORAL_TABLET | ORAL | Status: DC | PRN

## 2019-05-26 MED ORDER — FLEET ENEMA 7-19 GM/118ML RE ENEM: 1.0000 | ENEMA | Freq: Once | RECTAL | Status: DC | PRN

## 2019-05-26 MED ORDER — TRAMADOL HCL 50 MG PO TABS: 50.0000 mg | ORAL_TABLET | Freq: Four times a day (QID) | ORAL | Status: DC | PRN

## 2019-05-26 MED ORDER — PANTOPRAZOLE SODIUM 40 MG PO TBEC
40.0000 mg | DELAYED_RELEASE_TABLET | Freq: Every day | ORAL | Status: DC
  Administered 2019-05-27 – 2019-06-04 (×9): 40 mg via ORAL
  Filled 2019-05-26 (×9): qty 1

## 2019-05-26 MED ORDER — ASPIRIN EC 81 MG PO TBEC
81.0000 mg | DELAYED_RELEASE_TABLET | Freq: Every day | ORAL | Status: DC
  Administered 2019-05-27 – 2019-06-04 (×9): 81 mg via ORAL
  Filled 2019-05-26 (×9): qty 1

## 2019-05-26 MED ORDER — TRAZODONE HCL 50 MG PO TABS: 25.0000 mg | ORAL_TABLET | Freq: Every evening | ORAL | Status: DC | PRN

## 2019-05-26 MED ORDER — BISACODYL 10 MG RE SUPP: 10.0000 mg | Freq: Every day | RECTAL | Status: DC | PRN

## 2019-05-26 MED ORDER — ASPIRIN 81 MG PO TBEC: 81.0000 mg | DELAYED_RELEASE_TABLET | Freq: Every day | ORAL | 0 refills | Status: DC

## 2019-05-26 MED ORDER — METOPROLOL TARTRATE 50 MG PO TABS
75.0000 mg | ORAL_TABLET | Freq: Two times a day (BID) | ORAL | Status: DC
  Administered 2019-05-26 – 2019-05-28 (×5): 75 mg via ORAL
  Filled 2019-05-26 (×6): qty 1

## 2019-05-26 MED ORDER — PROCHLORPERAZINE 25 MG RE SUPP: 12.5000 mg | Freq: Four times a day (QID) | RECTAL | Status: DC | PRN

## 2019-05-26 MED ORDER — DOCUSATE SODIUM 100 MG PO CAPS
100.0000 mg | ORAL_CAPSULE | Freq: Two times a day (BID) | ORAL | Status: DC
  Administered 2019-05-26 – 2019-06-04 (×18): 100 mg via ORAL
  Filled 2019-05-26 (×18): qty 1

## 2019-05-26 MED ORDER — GABAPENTIN 300 MG PO CAPS
300.0000 mg | ORAL_CAPSULE | Freq: Three times a day (TID) | ORAL | Status: DC
  Administered 2019-05-26 – 2019-06-04 (×27): 300 mg via ORAL
  Filled 2019-05-26 (×27): qty 1

## 2019-05-26 MED ORDER — SERTRALINE HCL 100 MG PO TABS
200.0000 mg | ORAL_TABLET | Freq: Every day | ORAL | Status: DC
  Administered 2019-05-27 – 2019-06-04 (×9): 200 mg via ORAL
  Filled 2019-05-26 (×9): qty 2

## 2019-05-26 MED ORDER — POTASSIUM CHLORIDE CRYS ER 20 MEQ PO TBCR
20.0000 meq | EXTENDED_RELEASE_TABLET | Freq: Every day | ORAL | Status: DC
  Administered 2019-05-27 – 2019-06-04 (×9): 20 meq via ORAL
  Filled 2019-05-26 (×10): qty 1

## 2019-05-26 MED ORDER — ALUM & MAG HYDROXIDE-SIMETH 200-200-20 MG/5ML PO SUSP: 30.0000 mL | ORAL | Status: DC | PRN

## 2019-05-26 MED ORDER — ALLOPURINOL 300 MG PO TABS
300.0000 mg | ORAL_TABLET | Freq: Every day | ORAL | Status: DC
  Administered 2019-05-27 – 2019-06-04 (×9): 300 mg via ORAL
  Filled 2019-05-26 (×9): qty 1

## 2019-05-26 MED ORDER — POLYETHYLENE GLYCOL 3350 17 G PO PACK
17.0000 g | PACK | Freq: Every day | ORAL | Status: DC | PRN
  Administered 2019-05-29: 17 g via ORAL
  Filled 2019-05-26: qty 1

## 2019-05-26 MED ORDER — MIRABEGRON ER 50 MG PO TB24
50.0000 mg | ORAL_TABLET | Freq: Every day | ORAL | Status: DC
  Administered 2019-05-27 – 2019-06-04 (×9): 50 mg via ORAL
  Filled 2019-05-26 (×9): qty 1

## 2019-05-26 MED ORDER — METOPROLOL TARTRATE 50 MG PO TABS
75.0000 mg | ORAL_TABLET | Freq: Two times a day (BID) | ORAL | Status: DC
  Filled 2019-05-26: qty 1

## 2019-05-26 MED ORDER — TAMSULOSIN HCL 0.4 MG PO CAPS
0.4000 mg | ORAL_CAPSULE | Freq: Every day | ORAL | Status: DC
  Administered 2019-05-26 – 2019-06-03 (×9): 0.4 mg via ORAL
  Filled 2019-05-26 (×9): qty 1

## 2019-05-26 MED ORDER — FERROUS SULFATE 325 (65 FE) MG PO TABS
325.0000 mg | ORAL_TABLET | Freq: Every day | ORAL | Status: DC
  Administered 2019-05-27 – 2019-06-04 (×9): 325 mg via ORAL
  Filled 2019-05-26 (×9): qty 1

## 2019-05-26 MED ORDER — ROSUVASTATIN CALCIUM 20 MG PO TABS
40.0000 mg | ORAL_TABLET | Freq: Every day | ORAL | Status: DC
  Administered 2019-05-27 – 2019-06-04 (×9): 40 mg via ORAL
  Filled 2019-05-26 (×9): qty 2

## 2019-05-26 MED ORDER — METOPROLOL TARTRATE 25 MG PO TABS
25.0000 mg | ORAL_TABLET | Freq: Two times a day (BID) | ORAL | Status: DC
  Filled 2019-05-26: qty 1

## 2019-05-26 MED ORDER — FUROSEMIDE 40 MG PO TABS
80.0000 mg | ORAL_TABLET | Freq: Two times a day (BID) | ORAL | Status: DC
  Administered 2019-05-27 (×2): 80 mg via ORAL
  Filled 2019-05-26 (×7): qty 2

## 2019-05-26 MED ORDER — DIPHENHYDRAMINE HCL 12.5 MG/5ML PO ELIX: 12.5000 mg | ORAL_SOLUTION | Freq: Four times a day (QID) | ORAL | Status: DC | PRN

## 2019-05-26 MED ORDER — PROCHLORPERAZINE MALEATE 5 MG PO TABS: 5.0000 mg | ORAL_TABLET | Freq: Four times a day (QID) | ORAL | Status: DC | PRN

## 2019-05-26 MED ORDER — PROCHLORPERAZINE EDISYLATE 10 MG/2ML IJ SOLN: 5.0000 mg | Freq: Four times a day (QID) | INTRAMUSCULAR | Status: DC | PRN

## 2019-05-26 MED ORDER — GUAIFENESIN-DM 100-10 MG/5ML PO SYRP: 5.0000 mL | ORAL_SOLUTION | Freq: Four times a day (QID) | ORAL | Status: DC | PRN

## 2019-05-26 MED ORDER — FUROSEMIDE 80 MG PO TABS: 80.0000 mg | ORAL_TABLET | Freq: Two times a day (BID) | ORAL | Status: DC

## 2019-05-26 NOTE — Discharge Summary (Signed)
Physician Discharge Summary  [place]. FFM:[id number] DOB: 1940-12-28 DOA: 05/15/2019  PCP: [name], MD  Admit date: 05/15/2019 Discharge date: 05/26/2019  Time spent: 40 minutes  Recommendations for Outpatient Follow-up:  1. Follow up CMP/CBC outpatient 2. Follow volume status and BP on lasix 3. Pt needs follow up with cards for pacemaker gen change 4. Needs follow up for TIA vs stroke with neurology in [place] Even few weeks 5. Dr. Marlou Sa to follow up L toe amputation in CIR, discussed on day of discharge 6. Pt will need gastroenterology follow up outpatient for esophageal dysphagia and cirrhosis 7. Started on ASA, follow platelets.  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 fib, chronic anemia and thrombocytopenia and possible cirrhosis presented to the cardiology office for follow-up while increasing shortness of breath which has been ongoing for last few months. Patient states his shortness of breath further worsened acutely last few days. Has been Caidence Kaseman increasing lower extremity edema extending up to the thighs. Has noticed increasing erythema and discoloration of the left fourth toe last few days. Has been having frequent falls. Denies hitting his head or losing  consciousness. Denies chest pain productive cough fever chills abdominal pain  He was admitted for volume overload and found to have infected L 4th toe.  He was diuresed and had his L 4th toe amputated.  Post op he developed diplopia and was thought to have Havard Radigan stroke vs TIA.  MRI not able to be performed due to pacemaker.  He was started on aspirin.    See below for additional details  Hospital Course:  Diplopia: pt with double vision that seems to have started sometime 6/2.  Exam with ataxia worse on R, double vision which was subjectively worse on L eye.  Initially planned for MRI/MRA, but pt with pacemaker which is not MRI compatible.   Symptoms appear to have mostly resolved. Appreciate neurology recommendations - concerning for brainstem infarct vs TIA Started on ASA (not candidate for anticoagulation due to hx of spontaneous SDH in 2013 and frequent falls) LDL 51, continue crestor A1c 5.6 Echo done last week (EF 50-55%, see report) Bilateral ICA's with 1-39% stenosis Transcranial doppler normal  Head CT - stable and negative Neurology c/s, appreciate recs  Acute right heart failure  Echo from 5/30 with EF 50-55%, septal hypokinesis, RV not well visualized, cavity moderately enlarged (mildly reduced function of RV) - see report Cardiology c/s - signed off on 5/30, recommended d/c on lasix 80 mg BID Will d/c on metop and lasix.  Spironolactone d/c'd due to soft Bps.  Continue to follow BP and volume status, kidney function, lytes. EKG 6/6 shows afib with SVR Net negative 7.5 L I/O, daily weights Appears euvolemic at this time, will continue to monitor  Esophageal Dysphagia: F/u with GI outpatient esophageal precautions (smaller meals more frequently, upright when eating/drinking, smaller bites/sips)  Lower extremity cellulitis with possible ischemic fourth toe of the left foot. 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  He does have low platelets.  Albumin mildly low, bili mildly elevated. 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 Delirium prec  Jaimarie Rapozo. fib Resume metop with his RVR at times, follow  Not on anticoagulation secondary history of spontaneous subdural hematoma in 2013.  Sleep apnea -on CPAPat night  Deconditioning - PT eval; likely related to HF - rec CIR   Procedures: Echo IMPRESSIONS    1. The left ventricle has low normal systolic function, with an ejection fraction of 50-55%. The cavity size was normal. Left ventricular diastolic Doppler parameters are indeterminate.  2. LVEF is approximately 50 to 55% with septal hypokinesis. Poor.  3. The right ventricle was not well visualized. The cavity was moderately enlarged. There is no increase in right ventricular wall thickness.  4. RV is difficult to visualize, even with Definity It appears dilated and function is at least mildly reduced.  5. The mitral valve is abnormal. Mild thickening of the mitral valve leaflet.  6. The tricuspid valve is grossly normal. Tricuspid valve regurgitation is severe.  7. The aortic valve was not well visualized. Mild thickening of the aortic valve. Mild calcification of the aortic valve.  8. The inferior vena cava was dilated in size with <50% respiratory variability.  9. The interatrial septum was not assessed.  ABI Summary: Right: Resting right ankle-brachial index is within normal range. No evidence of significant right lower extremity arterial disease. The right toe-brachial index is abnormal.  Left: Resting left ankle-brachial  index is within normal range. No evidence of significant left lower extremity arterial disease. The left toe-brachial index is abnormal.  Carotid US Summary: Right Carotid: Velocities in the right ICA are consistent with Vyncent Overby 1-39% stenosis.  Left Carotid: Velocities in the left ICA are consistent with Kayliee Atienza 1-39% stenosis.  Vertebrals: Bilateral vertebral arteries demonstrate antegrade flow.  Transcranial [place] Summary: This was Donyae Kilner normal transcranial Doppler study, with normal flow direction and velocity of all identified vessels of the anterior and posterior circulations, with no evidence of stenosis, vasospasm or occlusion. There was no evidence of intracranial  disease.   L 4th toe amputation on 5/30  Consultations:  Neuro  Ortho  cardiology  Discharge Exam: Vitals:   05/26/19 0504 05/26/19 0912  BP: (!) 115/94 109/63  Pulse: 85 62  Resp: 20 18  Temp: 98.1 F (36.7 C) 98.6 F (37 C)  SpO2: 98% 100%   Feeling well. No complaints. Discussed with wife, d/c plan and recs.  General: No acute distress. Cardiovascular: Heart sounds show Konstantinos Cordoba regular rate, and rhythm. Lungs: Clear to auscultation bilaterally  Abdomen: Soft, nontender, nondistended  Neurological: Alert and oriented 3. Moves all extremities 4. Cranial nerves II through XII grossly intact. Skin: Warm and dry. No rashes or lesions. Extremities: No clubbing or cyanosis. No edema. Pedal pulses 2+. Psychiatric: Mood and affect are normal. Insight and  judgment are appropriate.  Discharge Instructions   Discharge Instructions    (HEART FAILURE PATIENTS) Call MD:  Anytime you have any of the following symptoms: 1) 3 pound weight gain in 24 hours or 5 pounds in 1 week 2) shortness of breath, with or without Evelene Roussin dry hacking cough 3) swelling in the hands, feet or stomach 4) if you have to sleep on extra pillows at night in order to breathe.   Complete by:  As directed    Ambulatory referral to Neurology   Complete by:   As directed    An appointment is requested in approximately: 4-6 weeks   Call MD for:  difficulty breathing, headache or visual disturbances   Complete by:  As directed    Call MD for:  extreme fatigue   Complete by:  As directed    Call MD for:  hives   Complete by:  As directed    Call MD for:  persistant dizziness or light-headedness   Complete by:  As directed    Call MD for:  persistant nausea and vomiting   Complete by:  As directed    Call MD for:  redness, tenderness, or signs of infection (pain, swelling, redness, odor or green/yellow discharge around incision site)   Complete by:  As directed    Call MD for:  severe uncontrolled pain   Complete by:  As directed    Call MD for:  temperature >100.4   Complete by:  As directed    Diet - low sodium heart healthy   Complete by:  As directed    Diet - low sodium heart healthy   Complete by:  As directed    Discharge instructions   Complete by:  As directed    You were seen for Torey Reinard heart failure exacerbation in addition to an infected left toe and Ruby Dilone stroke.  You were treated with lasix for your heart failure exacerbation.  This has improved.  We'll restart your home lasix at discharge.  You'll need close follow up of your kidney function and volume status as an outpatient.  You had surgery with Dr. Marlou Sa for your infected left toe which was amputated.  Please follow up with Dr. Marlou Sa after your rehab stay.  We think you had Kenaz Olafson stroke or transient ischemic attack while you were admitted.  Unfortunately, we were unable to confirm this with an MRI due to your pacemaker, but you've been started on aspirin.  Please follow up with neurology as an outpatient.  You'll need to follow up your pacemaker with cardiology, it will need Jontavious Commons pacemaker generator change in the next few weeks.  You should have follow up with gastroenterology as an outpatient for your esophageal dysphagia and cirrhosis of the liver.   Increase activity slowly   Complete  by:  As directed    Increase activity slowly   Complete by:  As directed      Allergies as of 05/26/2019      Reactions   Lipitor [atorvastatin] Other (See Comments)   Stiff joints   Nsaids Other (See Comments)   unknown   Warfarin And Related Other (See Comments)   Stiff joints   Allopurinol Itching   May or may not be allergic to this (If the patient should find out he is NOT allergic, please omit this entry.)   Enbrel [etanercept] Itching   May or may not be allergic to this (If the patient should find out he is NOT allergic,  please omit this entry.)      Medication List    TAKE these medications   allopurinol 300 MG tablet Commonly known as:  ZYLOPRIM Take 1 tablet by mouth daily.   aspirin 81 MG EC tablet Take 1 tablet (81 mg total) by mouth daily for 30 days. Start taking on:  May 27, 2019   colchicine 0.6 MG tablet Take 0.6-1.2 mg by mouth See admin instructions. Take 1.2 mg by mouth then 0.6 mg one hour later as needed for gout flares/ as directed.   ferrous sulfate 325 (65 FE) MG tablet Take 325 mg by mouth daily with breakfast.   Flomax 0.4 MG Caps capsule Generic drug:  tamsulosin Take 0.4 mg by mouth at bedtime.   furosemide 40 MG tablet Commonly known as:  LASIX Take 2 tablets (80 mg total) by mouth 2 (two) times daily.   metoprolol tartrate 50 MG tablet Commonly known as:  LOPRESSOR Take 1.5 tablets (75 mg total) by mouth 2 (two) times daily.   Myrbetriq 50 MG Tb24 tablet Generic drug:  mirabegron ER TAKE 1 TABLET BY MOUTH EVERY DAY What changed:  how much to take   pantoprazole 40 MG tablet Commonly known as:  PROTONIX Take 40 mg by mouth daily.   potassium chloride SA 20 MEQ tablet Commonly known as:  K-DUR Take 20 mEq by mouth daily.   rosuvastatin 40 MG tablet Commonly known as:  CRESTOR Take 1 tablet (40 mg total) by mouth daily.   sertraline 100 MG tablet Commonly known as:  ZOLOFT Take 200 mg by mouth daily.      Allergies   Allergen Reactions  . Lipitor [Atorvastatin] Other (See Comments)    Stiff joints  . Nsaids Other (See Comments)    unknown  . Warfarin And Related Other (See Comments)    Stiff joints  . Allopurinol Itching    May or may not be allergic to this (If the patient should find out he is NOT allergic, please omit this entry.)  . Enbrel [Etanercept] Itching    May or may not be allergic to this (If the patient should find out he is NOT allergic, please omit this entry.)   Follow-up Information    Patsey Berthold, NP Follow up.   Specialty:  Cardiology Why:  05/28/2019 @ 8:40AM,  pacemaker battery check, and discussion on timing of battery change  Contact information: [place] [address] [phone]        [facility] Follow up.   Specialty:  Home Health Services Why:  They will do your home health care at your home; Telephone Number - [phone]       Kathrynn Ducking, MD. Schedule an appointment as soon as possible for Randy Whitener visit in 4 week(s).   Specialty:  Neurology Contact information: [address] [place] [address] [phone]        [name], MD .   Specialty:  Cardiology Contact information: [place] [address] [phone]        [name], MD Follow up.   Specialty:  Internal Medicine Contact information: [address] [place] [address] [phone]            The results of significant diagnostics from this hospitalization (including imaging, microbiology, ancillary and laboratory) are listed below for reference.    Significant Diagnostic Studies: Dg Chest 2 View  Result Date: 05/15/2019 CLINICAL DATA:  One-week history of shortness of breath that worsens with exertion. Former smoker. Prior CABG. Indwelling pacemaker. EXAM: CHEST - 2 VIEW COMPARISON:  03/19/2017 and earlier, including CT chest 05/03/2017 and earlier. FINDINGS: AP  ERECT and LATERAL images were obtained, and the LATERAL image is blurred by respiratory motion. Prior sternotomy for CABG. Cardiac silhouette moderately enlarged, unchanged. LEFT subclavian dual lead transvenous pacemaker unchanged and appears intact. Thoracic aorta atherosclerotic, unchanged. Hilar and mediastinal contours otherwise unremarkable. 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 Pulmonary vascularity normal without evidence of pulmonary edema currently. No confluent airspace consolidation. No pleural effusions. Mild degenerative changes involving the thoracic spine. IMPRESSION: 1. Stable cardiomegaly. No acute cardiopulmonary disease. 2. Chronic interstitial lung disease with Armandina Iman basilar predominance, likely pulmonary fibrosis, progressive since 2018. Electronically Signed   By: Evangeline Dakin M.D.   On: 05/15/2019 16:53   Ct Head Wo Contrast  Result Date: 05/21/2019 CLINICAL DATA:  78 year old male right frontal headache. History of subdural hematoma and craniotomy in 2013. EXAM: CT HEAD WITHOUT CONTRAST TECHNIQUE: Contiguous axial images were obtained from the base of the skull through the vertex without intravenous contrast. COMPARISON:  07/11/2018 and earlier. FINDINGS: Brain: Stable cerebral volume. No midline shift, ventriculomegaly, mass effect, evidence of mass lesion, intracranial hemorrhage or evidence of cortically based acute infarction. Gray-white matter differentiation is within normal limits throughout the brain. Small perivascular space again suspected in the right putamen. No cortical encephalomalacia identified. Vascular: Mild Calcified atherosclerosis at the skull base. No suspicious intracranial vascular hyperdensity. Skull: Stable right parietal craniotomy changes. No acute osseous abnormality identified. Sinuses/Orbits: Visualized paranasal sinuses and mastoids are stable and well pneumatized. Other: No acute  orbit or scalp soft tissue findings. IMPRESSION: Stable and negative for age noncontrast CT appearance of the brain. Electronically Signed   By: Genevie Ann M.D.   On: 05/21/2019 13:07   Dg Toe 4th Left  Result Date: 05/15/2019 CLINICAL DATA:  Pain of the fourth toe EXAM: LEFT FOURTH TOE COMPARISON:  07/30/2018 FINDINGS: There is an old osteotomy of the fifth metatarsal. No bone or joint pathology is seen affecting the fourth toe. Distal soft tissues appear somewhat irregular, possibly with some soft tissue gas. IMPRESSION: No bone or joint abnormality of the fourth toe. Question soft tissue abnormality distally, possibly with some soft tissue gas. Electronically Signed   By: Nelson Chimes M.D.   On: 05/15/2019 18:23   Vas [place] Burnard Bunting With/wo Tbi  Result Date: 05/20/2019 LOWER EXTREMITY DOPPLER STUDY Indications: Rest pain, and Ischemic toe.  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 Performing Technologist: Carlos Levering RVT  Examination Guidelines: Julianne Chamberlin complete evaluation includes at minimum, Doppler waveform signals and systolic blood pressure reading at the level of bilateral brachial, anterior tibial, and posterior tibial arteries, when vessel segments are accessible. Bilateral testing is considered an integral part of Brynnly Bonet complete examination. Photoelectric Plethysmograph (PPG) waveforms and toe systolic pressure readings are included as required and additional duplex testing as needed. Limited examinations for reoccurring indications may be performed as noted.  ABI Findings: +---------+------------------+-----+---------+--------+ Right    Rt Pressure (mmHg)IndexWaveform Comment  +---------+------------------+-----+---------+--------+ Brachial 83                     triphasic         +---------+------------------+-----+---------+--------+ PTA      100               1.06 triphasic         +---------+------------------+-----+---------+--------+  DP       102               1.09  triphasic         +---------+------------------+-----+---------+--------+ Great Toe53                0.56                   +---------+------------------+-----+---------+--------+ +---------+------------------+-----+---------+-------+ Left     Lt Pressure (mmHg)IndexWaveform Comment +---------+------------------+-----+---------+-------+ Brachial 94                     triphasic        +---------+------------------+-----+---------+-------+ PTA      115               1.22 triphasic        +---------+------------------+-----+---------+-------+ DP       98                1.04 triphasic        +---------+------------------+-----+---------+-------+ Great Toe57                0.61                  +---------+------------------+-----+---------+-------+ +-------+-----------+-----------+------------+------------+ ABI/TBIToday's ABIToday's TBIPrevious ABIPrevious TBI +-------+-----------+-----------+------------+------------+ Right  1.09       0.56                                +-------+-----------+-----------+------------+------------+ Left   1.22       0.61                                +-------+-----------+-----------+------------+------------+  Summary: Right: Resting right ankle-brachial index is within normal range. No evidence of significant right lower extremity arterial disease. The right toe-brachial index is abnormal. Left: Resting left ankle-brachial index is within normal range. No evidence of significant left lower extremity arterial disease. The left toe-brachial index is abnormal.  *See table(s) above for measurements and observations.  Electronically signed by Deitra Mayo MD on 05/20/2019 at 12:55:30 PM.   Final    [place] Ascites (abdomen Limited)  Result Date: 05/16/2019 CLINICAL DATA:  Ascites. EXAM: LIMITED ABDOMEN ULTRASOUND FOR ASCITES TECHNIQUE: Limited ultrasound survey for ascites was performed in all four abdominal quadrants. COMPARISON:   None. FINDINGS: All 4 quadrants of the abdomen were imaged without significant free fluid identified. IMPRESSION: No evidence of ascites. Electronically Signed   By: Logan Bores M.D.   On: 05/16/2019 11:33   Vas US Carotid  Result Date: 05/22/2019 Carotid Arterial Duplex Study Indications:      CVA. Risk Factors:     Hypertension, hyperlipidemia, Diabetes, coronary artery                   disease. Comparison Study: Carotid duplex done on 12/25/17 showed bilateral 1-39% Performing Technologist: Abram Sander RVS  Examination Guidelines: Shoji Pertuit complete evaluation includes B-mode imaging, spectral Doppler, color Doppler, and power Doppler as needed of all accessible portions of each vessel. Bilateral testing is considered an integral part of Aniyla Harling complete examination. Limited examinations for reoccurring indications may be performed as noted.  Right Carotid Findings: +----------+--------+--------+--------+-----------+--------+           PSV cm/sEDV cm/sStenosisDescribe   Comments +----------+--------+--------+--------+-----------+--------+ CCA Prox  82      19  homogeneous         +----------+--------+--------+--------+-----------+--------+ CCA Distal73      18              homogeneous         +----------+--------+--------+--------+-----------+--------+ ICA Prox  67      28      1-39%   homogeneous         +----------+--------+--------+--------+-----------+--------+ ICA Distal70      23                                  +----------+--------+--------+--------+-----------+--------+ ECA       104     21                                  +----------+--------+--------+--------+-----------+--------+ +----------+--------+-------+--------+-------------------+           PSV cm/sEDV cmsDescribeArm Pressure (mmHG) +----------+--------+-------+--------+-------------------+ [id number]                                          +----------+--------+-------+--------+-------------------+ +---------+--------+--+--------+--+---------+ VertebralPSV cm/s56EDV cm/s17Antegrade +---------+--------+--+--------+--+---------+  Left Carotid Findings: +----------+--------+--------+--------+-----------+--------+           PSV cm/sEDV cm/sStenosisDescribe   Comments +----------+--------+--------+--------+-----------+--------+ CCA Prox  60      18              homogeneous         +----------+--------+--------+--------+-----------+--------+ CCA Distal74      21              homogeneous         +----------+--------+--------+--------+-----------+--------+ ICA Prox  107     34      1-39%   homogeneous         +----------+--------+--------+--------+-----------+--------+ ICA Distal64      26                                  +----------+--------+--------+--------+-----------+--------+ ECA       109     19                                  +----------+--------+--------+--------+-----------+--------+ +----------+--------+--------+--------+-------------------+ SubclavianPSV cm/sEDV cm/sDescribeArm Pressure (mmHG) +----------+--------+--------+--------+-------------------+           87                                          +----------+--------+--------+--------+-------------------+ +---------+--------+--+--------+-+---------+ VertebralPSV cm/s38EDV cm/s9Antegrade +---------+--------+--+--------+-+---------+  Summary: Right Carotid: Velocities in the right ICA are consistent with Johnie Makki 1-39% stenosis. Left Carotid: Velocities in the left ICA are consistent with Zedrick Springsteen 1-39% stenosis. Vertebrals: Bilateral vertebral arteries demonstrate antegrade flow. *See table(s) above for measurements and observations.  Electronically signed by Antony Contras MD on 05/22/2019 at 12:38:52 PM.    Final    Vas [place] Transcranial Doppler  Result Date: 05/22/2019  Transcranial Doppler Indications: Stroke. Performing Technologist:  Abram Sander RVS  Examination Guidelines: Carinne Brandenburger complete evaluation includes B-mode imaging, spectral Doppler, color Doppler, and power Doppler as needed of all accessible portions of each vessel. Bilateral testing is considered an  integral part of Timothea Bodenheimer complete examination. Limited examinations for reoccurring indications may be performed as noted.  +----------+-------------+----------+-----------+-------+ RIGHT TCD Right VM (cm)Depth (cm)PulsatilityComment +----------+-------------+----------+-----------+-------+ MCA           52.00                   1             +----------+-------------+----------+-----------+-------+ Term ICA      39.00                 1.19            +----------+-------------+----------+-----------+-------+ PCA           15.00                 1.03            +----------+-------------+----------+-----------+-------+ Opthalmic     15.00                 1.58            +----------+-------------+----------+-----------+-------+ ICA siphon    29.00                 1.06            +----------+-------------+----------+-----------+-------+ Vertebral    -17.00                 1.11            +----------+-------------+----------+-----------+-------+  +----------+------------+----------+-----------+-------+ LEFT TCD  Left VM (cm)Depth (cm)PulsatilityComment +----------+------------+----------+-----------+-------+ MCA          54.00                 1.01            +----------+------------+----------+-----------+-------+ Term ICA     26.00                 1.02            +----------+------------+----------+-----------+-------+ PCA          22.00                 1.08            +----------+------------+----------+-----------+-------+ Opthalmic    19.00                 1.33            +----------+------------+----------+-----------+-------+ ICA siphon   48.00                 0.92             +----------+------------+----------+-----------+-------+ Vertebral    -24.00                1.23            +----------+------------+----------+-----------+-------+  +------------+-------+-------+             VM cm/sComment +------------+-------+-------+ Prox Basilar-32.00         +------------+-------+-------+ Summary: This was Giuseppina Quinones normal transcranial Doppler study, with normal flow direction and velocity of all identified vessels of the anterior and posterior circulations, with no evidence of stenosis, vasospasm or occlusion. There was no evidence of intracranial disease.  *See table(s) above for measurements and observations.  Diagnosing physician: Antony Contras MD Electronically signed by Antony Contras MD on 05/22/2019 at 12:40:18 PM.    Final     Microbiology: No results found for this or any previous visit (from the past 240 hour(s)).   Labs: Basic Metabolic Panel:  Recent Labs  Lab 05/20/19 0609 05/21/19 0433 05/22/19 0338 05/22/19 1011 05/23/19 0449 05/24/19 0518 05/25/19 0533 05/26/19 0544  NA 141 142 142  --  140 140 140 139  K 4.6 4.4 5.4* 4.4 4.4 4.6 4.6 4.4  CL 101 102 104  --  104 105 105 106  CO2 33* 30 30  --  28 26 28 25   GLUCOSE 94 96 105*  --  98 93 97 99  BUN 39* 41* 41*  --  38* 35* 28* 26*  CREATININE 1.83* 1.82* 1.86*  --  1.66* 1.49* 1.35* 1.26*  CALCIUM 9.3 9.4 9.7  --  9.3 9.3 9.6 9.5  MG 2.1 2.2 2.3  --  2.2  --  2.1 2.0  PHOS 4.2 3.7  --   --   --   --   --   --    Liver Function Tests: Recent Labs  Lab 05/22/19 0338 05/23/19 0449 05/24/19 0518 05/25/19 0533 05/26/19 0544  AST 31 29 30  35 35  ALT 18 20 19 21 21   ALKPHOS 74 73 64 71 75  BILITOT 0.4 0.6 0.9 0.7 0.8  PROT 7.3 7.8 7.3 7.3 7.1  ALBUMIN 3.4* 3.5 3.4* 3.3* 3.2*   No results for input(s): LIPASE, AMYLASE in the last 168 hours. No results for input(s): AMMONIA in the last 168 hours. CBC: Recent Labs  Lab 05/20/19 0609 05/21/19 0433 05/22/19 0338 05/23/19 0449  05/24/19 0518 05/25/19 0533 05/26/19 0544  WBC 5.2 4.9 5.3 5.0 5.8 5.8 5.5  NEUTROABS 3.7 3.3  --   --   --   --   --   HGB 10.3* 10.8* 10.4* 10.5* 10.1* 10.6* 10.2*  HCT 33.2* 34.4* 33.6* 33.5* 31.5* 33.1* 32.6*  MCV 91.5 91.2 90.6 91.0 89.7 89.9 90.1  PLT 97* 90* 92* 87* 91* 84* 87*   Cardiac Enzymes: No results for input(s): CKTOTAL, CKMB, CKMBINDEX, TROPONINI in the last 168 hours. BNP: BNP (last 3 results) Recent Labs    05/16/19 0609  BNP 289.1*    ProBNP (last 3 results) No results for input(s): PROBNP in the last 8760 hours.  CBG: Recent Labs  Lab 05/20/19 2121  GLUCAP 146*       Signed:  Fayrene Helper MD.  [facility] 05/26/2019, 2:56 PM

## 2019-05-26 NOTE — PMR Pre-admission (Signed)
PMR Admission Coordinator Pre-Admission Assessment  Patient: [name]. is an 78 y.o., male MRN: [id number] DOB: 08-May-1941 Height: 5' 8.5" (174 cm) Weight: 83.9 kg              Insurance Information HMO:     PPO:      PCP:      IPA:      80/20:      OTHER:  PRIMARY: Medicare A and B      Policy#: [id number]      Subscriber: patient CM Name:       Phone#:      Fax#:  Pre-Cert#: verified via passport one      Employer:  Benefits:  Phone #:      Name:  Eff. Date: Part A 12/19/2007, Part B 12/18/2010     Deduct: $1408      Out of Pocket Max: n/a      Life Max: n/a CIR: 100%      SNF: 20 full days Outpatient: 80%     Co-Pay: 20% Home Health: 100%      Co-Pay:  DME: 80%     Co-Pay: 20% Providers: pt choice  SECONDARY:       Policy#:       Subscriber:  CM Name:       Phone#:      Fax#:  Pre-Cert#:       Employer:  Benefits:  Phone #:      Name:  Eff. Date:      Deduct:       Out of Pocket Max:       Life Max:  CIR:       SNF:  Outpatient:      Co-Pay:  Home Health:       Co-Pay:  DME:      Co-Pay:   Medicaid Application Date:       Case Manager:  Disability Application Date:       Case Worker:   The "Data Collection Information Summary" for patients in Inpatient Rehabilitation Facilities with attached "Privacy Act [place] Records" was provided and verbally reviewed with: Patient and Family  Emergency Contact Information Contact Information    Name Relation Home Work Mobile   [name],Jamie Spouse [phone]  [place] Daughter   [phone]     Current Medical History  Patient Admitting Diagnosis: brainstem CVA, debility  History of Present Illness: [name]. is a 78 y.o. male with history of CAD, CAF, T2DM, [name], Pulmonary fibrosis/OSA, h/o SDH, lewy body dementia who was admitted via cardiology office on 05/15/19 with few months history of progressive SOB, peripheral edema and frequent falls . He was treated for acute on  chronic CHF and started on antibiotics due to left foot cellulitis.  2  D echo with EF 50-55% septal hypokinesis and moderately enlarged RV. ABI without evidence of significant disease. Dr. 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  Patient with chronic anemia and thrombocytopenia with cirrhosis of liver and H/H platelets being monitored.  He developed dizziness with transient diplopia on 6/3 and CTA negative. MRI not done due to PPM and neurology questioned small brain stem infarct v/s TIA due to small vessel disease. Now on ASA but not on AC due to history of spontaneous SDH as well as gait disorder with falls. Therapy ongoing and patient limited gait disorder, BLE instability and poor safety awareness. Family requesting CIR due to  debility.       Past Medical History  Past Medical History:  Diagnosis Date  . (HFpEF) heart failure with preserved ejection fraction ([place])    a. 05/2013 Echo: EF 55%, mild LVH, diast dysfxn, Ao sclerosis, mildly dil LA, RV dysfxn (poorly visualized), PASP 68mmHg;  b. 03/2017 Echo: EF 55-60%, no rwma, triv MR, mildly dil RV, mod TR, PASP 54mmHg.  . Atrial fibrillation [facility])    s/p [facility] 1/09;  Multaq Rx d/c'd in 2014 due to pulmo fibrosis;  coumadin d/c'd in 2014 due to spontaneous subdural hematoma  . BPH (benign prostatic hyperplasia)   . CAD (coronary artery disease), native coronary artery    a. s/p CABG 12/2007;  b. Myoview 12/2011: EF 66%, no scar or ischemia; normal.  . DM (diabetes mellitus) ([place])   . Hyperlipidemia type II   . Hypertension   . 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 OSA (obstructive sleep apnea)   . Pacemaker    PPM - St. Jude  . Peripheral neuropathy 07/31/2018  . Pulmonary fibrosis ([place])    Multaq d/c'd 7/14  . Subdural hematoma ([place]) 07/2012   spontaneous;  coumadin d/c'd => no longer a candidate for anticoagulation    Family History  family history includes  Alzheimer's disease in his mother; Dementia in his mother; Heart attack in his father; Heart disease in his father and mother; Heart failure in his father.  Prior Rehab/Hospitalizations:  Has the patient had prior rehab or hospitalizations prior to admission? No  Has the patient had major surgery during 100 days prior to admission? Yes  Current Medications   Current Facility-Administered Medications:  .  0.9 %  sodium chloride infusion, , Intravenous, PRN, Marylyn Ishihara, Tyrone A, DO, Last Rate: 75 mL/hr at 05/20/19 1722 .  acetaminophen (TYLENOL) tablet 650 mg, 650 mg, Oral, Q6H PRN, 650 mg at 05/17/19 0730 **OR** acetaminophen (TYLENOL) suppository 650 mg, 650 mg, Rectal, Q6H PRN, Meredith Pel, MD .  allopurinol (ZYLOPRIM) tablet 300 mg, 300 mg, Oral, Daily, Marlou Sa Tonna Corner, MD, 300 mg at 05/26/19 0914 .  aspirin EC tablet 81 mg, 81 mg, Oral, Daily, Rosalin Hawking, MD, 81 mg at 05/26/19 0914 .  docusate sodium (COLACE) capsule 100 mg, 100 mg, Oral, BID, Marlou Sa Tonna Corner, MD, 100 mg at 05/26/19 0914 .  ferrous sulfate tablet 325 mg, 325 mg, Oral, Q breakfast, Marlou Sa Tonna Corner, MD, 325 mg at 05/26/19 0914 .  gabapentin (NEURONTIN) capsule 300 mg, 300 mg, Oral, TID, Marlou Sa Tonna Corner, MD, 300 mg at 05/26/19 0914 .  HYDROcodone-acetaminophen (NORCO/VICODIN) 5-325 MG per tablet 1-2 tablet, 1-2 tablet, Oral, Q4H PRN, Marlou Sa Tonna Corner, MD, 2 tablet at 05/19/19 0238 .  lactated ringers infusion, , Intravenous, Continuous, Marlou Sa Tonna Corner, MD, Last Rate: 10 mL/hr at 05/20/19 2209 .  metoCLOPramide (REGLAN) tablet 5-10 mg, 5-10 mg, Oral, Q8H PRN **OR** metoCLOPramide (REGLAN) injection 5-10 mg, 5-10 mg, Intravenous, Q8H PRN, Meredith Pel, MD .  metoprolol tartrate (LOPRESSOR) tablet 25 mg, 25 mg, Oral, BID, Elodia Florence., MD .  mirabegron [facility]) tablet 50 mg, 50 mg, Oral, Daily, Marlou Sa Tonna Corner, MD, 50 mg at 05/26/19 0914 .  ondansetron (ZOFRAN) tablet 4 mg, 4  mg, Oral, Q6H PRN **OR** ondansetron (ZOFRAN) injection 4 mg, 4 mg, Intravenous, Q6H PRN, Meredith Pel, MD .  pantoprazole (PROTONIX) EC tablet 40 mg, 40 mg, Oral, Daily, Marlou Sa Tonna Corner, MD, 40 mg at 05/26/19  8921 .  potassium chloride SA (K-DUR) CR tablet 20 mEq, 20 mEq, Oral, Daily, Marlou Sa, Tonna Corner, MD, 20 mEq at 05/26/19 0914 .  rosuvastatin (CRESTOR) tablet 40 mg, 40 mg, Oral, Daily, Marlou Sa Tonna Corner, MD, 40 mg at 05/26/19 0914 .  sertraline (ZOLOFT) tablet 200 mg, 200 mg, Oral, Daily, Marlou Sa, Tonna Corner, MD, 200 mg at 05/26/19 0914 .  sodium chloride flush (NS) 0.9 % injection 3 mL, 3 mL, Intravenous, Once, Marlou Sa, Tonna Corner, MD .  tamsulosin [facility] capsule 0.4 mg, 0.4 mg, Oral, QHS, Marlou Sa Tonna Corner, MD, 0.4 mg at 05/25/19 2123  Patients Current Diet:  Diet Order            Diet heart healthy/carb modified Room service appropriate? Yes; Fluid consistency: Thin; Fluid restriction: 1200 mL Fluid  Diet effective now              Precautions / Restrictions Precautions Precautions: Fall Other Brace: post op shoe--L Restrictions Weight Bearing Restrictions: No LLE Weight Bearing: Weight bearing as tolerated   Has the patient had 2 or more falls or a fall with injury in the past year?Yes  Prior Activity Level Limited Community (1-2x/wk): still driving, lots of falls, works in the yard  Prior Functional Level Prior Function Level of Independence: Independent with assistive device(s) Comments: Uses SPC for ambulation. Uses RW outside. Lots of falls at home, "hunny, I cannot even count that high."  Drives. Reports working in the yard.  Self Care: Did the patient need help bathing, dressing, using the toilet or eating?  Independent  Indoor Mobility: Did the patient need assistance with walking from room to room (with or without device)? Independent, but with lots of falls  Stairs: Did the patient need assistance with internal or external stairs (with or  without device)? Independent  Functional Cognition: Did the patient need help planning regular tasks such as shopping or remembering to take medications? Needed some help  Home Assistive Devices /  City Devices/Equipment: Kasandra Knudsen (specify quad or straight)(straight) Home Equipment: Walker - 2 wheels, Wheelchair - manual, Bedside commode, Shower seat - built in, [place] - single point  Prior Device Use: Indicate devices/aids used by the patient prior to current illness, exacerbation or injury? cane  Current Functional Level Cognition  Overall Cognitive Status: Impaired/Different from baseline Orientation Level: Oriented X4 Following Commands: Follows one step commands inconsistently, Follows one step commands with increased time Safety/Judgement: Decreased awareness of safety, Decreased awareness of deficits General Comments: Pt A&O x 4. Very witty and joking during session. Decreased safety    Extremity Assessment (includes Sensation/Coordination)  Upper Extremity Assessment: Overall WFL for tasks assessed  Lower Extremity Assessment: Defer to PT evaluation    ADLs  Overall ADL's : Needs assistance/impaired Eating/Feeding: Independent, Sitting Grooming: Wash/dry hands, Wash/dry face, Sitting, Set up Upper Body Bathing: Minimal assistance, Sitting Lower Body Bathing: Moderate assistance, Sit to/from stand Upper Body Dressing : Minimal assistance, Sitting Lower Body Dressing: Moderate assistance, Sit to/from stand Toilet Transfer: Minimal assistance, Ambulation, RW Toileting- Clothing Manipulation and Hygiene: Minimal assistance, Sit to/from stand Functional mobility during ADLs: Minimal assistance, Rolling walker    Mobility  Overal bed mobility: Needs Assistance Bed Mobility: Supine to Sit, Sit to Supine Rolling: Modified independent (Device/Increase time) Sidelying to sit: Min guard, HOB elevated Supine to sit: Min assist, HOB elevated Sit to supine: Min  assist General bed mobility comments: heavy reliance on bedrail. Assist to elevate trunk.    Transfers  Overall transfer level: Needs  assistance Equipment used: Rolling walker (2 wheeled) Transfers: Sit to/from Stand Sit to Stand: Min assist General transfer comment: cues for hand placement, increased time and effort    Ambulation / Gait / Stairs / Emergency planning/management officer  Ambulation/Gait Ambulation/Gait assistance: Min assist, +2 safety/equipment Gait Distance (Feet): 100 Feet(x 2) Assistive device: Rolling walker (2 wheeled) Gait Pattern/deviations: Step-through pattern, Decreased stride length General Gait Details: decreased foot clearance on L. 5 minute seated rest break between 100-foot gait trials due to fatigue and mild SOB. During seated rest SpO2 98% on RA and HR 113. +2 utilized for chair follow. Gait velocity: decreased Gait velocity interpretation: <1.31 ft/sec, indicative of household ambulator    Posture / Balance Balance Overall balance assessment: Needs assistance Sitting-balance support: Feet supported, No upper extremity supported Sitting balance-Leahy Scale: Good Standing balance support: Bilateral upper extremity supported, During functional activity Standing balance-Leahy Scale: Poor Standing balance comment: reliant on RW    Special needs/care consideration BiPAP/CPAP no CPM no Continuous Drip IV no Dialysis no        Days n/a Life Vest no Oxygen no Special Bed no Trach Size no Wound Vac (area) no      Location n/a Skin incision to L toe amp, ecchymosis and abrasions  Bowel mgmt: continent, last BM 05/24/2019 Bladder mgmt: condom cath Diabetic mgmt no Behavioral consideration no Chemo/radiation no     Previous Home Environment (from acute therapy documentation) Living Arrangements: Spouse/significant other Available Help at Discharge: Family, Available 24 hours/day Type of Home: House Home Layout: One level Home Access: Stairs to enter Entrance  Stairs-Rails: Right, Left, Can reach both Entrance Stairs-Number of Steps: 3 Bathroom Shower/Tub: Gaffer, Charity fundraiser: Programmer, systems: Yes Home Care Services: No  Discharge Living Setting Plans for Discharge Living Setting: Patient's home Type of Home at Discharge: House Discharge Home Layout: One level Discharge Home Access: Stairs to enter Entrance Stairs-Rails: (bilateral grab bars from garage entrance) Entrance Stairs-Number of Steps: 3 Discharge Bathroom Shower/Tub: Walk-in shower Discharge Bathroom Toilet: Standard Discharge Bathroom Accessibility: Yes How Accessible: Accessible via walker Does the patient have any problems obtaining your medications?: No  Social/Family/Support Systems Patient Roles: Spouse Anticipated Caregiver: pt's wife, Jamie Anticipated Ambulance person Information: [phone] (c), [phone] (h) Ability/Limitations of Caregiver: min assist Caregiver Availability: 24/7 Discharge Plan Discussed with Primary Caregiver: Yes Is Caregiver In Agreement with Plan?: Yes Does Caregiver/Family have Issues with Lodging/Transportation while Pt is in Rehab?: No   Goals/Additional Needs Patient/Family Goal for Rehab: PT/OT mod I, n/a SLP Expected length of stay: 7-10  Dietary Needs: carb modified, heart healthy, 1200 mL fluid restriction Equipment Needs: tbd Pt/Family Agrees to Admission and willing to participate: Yes Program Orientation Provided & Reviewed with Pt/Caregiver Including Roles  & Responsibilities: Yes   Possible need for SNF placement upon discharge:no   Patient Condition: This patient's condition remains as documented in the consult dated 05/26/2019, in which the Rehabilitation Physician determined and documented that the patient's condition is appropriate for intensive rehabilitative care in an inpatient rehabilitation facility. Will admit to inpatient rehab today.  Preadmission Screen Completed By:   Michel Santee, PT, DPT 05/26/2019 1:01 PM ______________________________________________________________________   Discussed status with Dr. Naaman Plummer on 05/26/19 at 1:08 PM  and received approval for admission today.  Admission Coordinator:  Michel Santee, PT, DPT time 1:08 PM Sudie Grumbling 05/26/19

## 2019-05-26 NOTE — Care Management Important Message (Signed)
Important Message  Patient Details  Name: [name]. MRN: [id number] Date of Birth: 14-Sep-1941   Medicare Important Message Given:  Yes    Shelda Altes 05/26/2019, 12:01 PM

## 2019-05-26 NOTE — Progress Notes (Addendum)
Inpatient Rehab Admissions:  Inpatient Rehab Consult received.  I met with patient at the bedside for rehabilitation assessment and to discuss goals and expectations of an inpatient rehab admission.  He is hopeful for CIR admission and asked that I confirm with his wife.  I was able to reach her and she confirmed that she will be able to provide 24/7 supervision if needed.  She is also hopeful for rehab admission.  I will look to possibly admit today pending approval by attending.   Addendum: I have approval from Dr. Florene Glen for pt to admit to CIR today. I will let pt/family, RN, and CM know.  Signed: Shann Medal, PT, DPT Admissions Coordinator [phone] 05/26/19  12:37 PM

## 2019-05-26 NOTE — Progress Notes (Signed)
Patient transfer to [facility] per order for inpatient rehab.

## 2019-05-26 NOTE — TOC Transition Note (Signed)
Transition of Care [facility]) - CM/SW Discharge Note Marvetta Gibbons RN,BSN Transitions of Care Cross Coverage 3E - RN Case Manager [phone]  Patient Details  Name: [name]. MRN: [id number] Date of Birth: 07-23-1941  Transition of Care [facility]) CM/SW Contact:  Dawayne Patricia, RN Phone Number: 05/26/2019, 2:22 PM   Clinical Narrative:    Admitted with HF, CIR consulted today for possible admission and [facility] admission coordinator they are able to offer a bed today and admit to [facility] IP rehab later today. Per notes pt and wife agreeable to IP rehab. Pt stable per MD to transition to [place] rehab today.    Final next level of care: IP Rehab Facility Barriers to Discharge: No Barriers Identified   Patient Goals and CMS Choice Patient states their goals for this hospitalization and ongoing recovery are:: to be able to retunr home CMS Medicare.gov Compare Post Acute Care list provided to:: Patient Choice offered to / list presented to : Patient  Discharge Placement  [facility] IP rehab            Patient chooses bed at: (Inpatient rehab)     Patient and family notified of of transfer: 05/26/19  Discharge Plan and Services In-house Referral: [facility] Discharge Planning Services: CM Consult Post Acute Care Choice: IP Rehab          DME Arranged: N/A DME Agency: NA       HH Arranged: NA HH Agency: NA Date HH Agency Contacted: 05/19/19 Time [place]: 1406 Representative spoke with at [place]: Dan RN  Social Determinants of Health ([place]) Interventions     Readmission Risk Interventions Readmission Risk Prevention Plan 05/26/2019  Transportation Screening Complete  PCP or Specialist Appt within 3-5 Days Complete  HRI or [place] Complete  Social Work Consult for [place] Planning/Counseling Complete  Palliative Care Screening Not Applicable  Medication Review Press photographer) Complete  Some recent data might be hidden

## 2019-05-26 NOTE — Progress Notes (Signed)
RT set up pt home cpap unit. Pt stated he could place self on unit when ready for bed. RT will continue to monitor as needed.

## 2019-05-26 NOTE — H&P (Signed)
Physical Medicine and Rehabilitation Admission H&P    Chief Complaint  Patient presents with  . Functional deficits.     HPI: [name] is a 78 year old male with history of CAD, CAF, T2DM. Pulmonary fibrosis, OS Lewy body dementia who was admitted via cardiology office on 05/15/2019 with few months history of progressive S OB, cellulitis left foot, peripheral edema and frequent falls.  He was treated for acute on chronic CHF and started on antibiotics due to left foot cellulitis.  2D echo showed EF of 50 to 55% with septal hypokinesis and moderately enlarged RV.  ABIs without evidence of significant disease but Dr. Marlou Sa recommended left fourth toe amputation which was performed on 05/17/2019.  Patient developed transient diplopia with dizziness on 0.6/3 and CTA head was negative.  MRI not done today to PPM.  Neurology question brainstem infarct versus TIA due to small vessel disease.  Now on aspirin and not on AC due to history of spontaneous SDH as well as gait disorder with falls.   He developed acute renal failure therefore lasix discontinued on 6/2 and metoprolol discontinued on 6/3 due to bradycardia.  He has had issues with anemia with thrombocytopenia due to cirrhosis of the liver.  Platelets with downward trend and being monitored.  AKI resolving with serum creatinine down to 1.26.  Therapy is ongoing and patient continues to be limited by balance deficits with poor safety awareness affecting overall functional status.  CIR recommended for follow-up therapy   Review of Systems  HENT: Positive for hearing loss.   Eyes: Negative for blurred vision.  Respiratory: Negative for shortness of breath and wheezing.   Cardiovascular: Negative for chest pain and leg swelling.  Gastrointestinal: Negative for abdominal pain, heartburn and nausea.  Genitourinary: Positive for frequency and urgency.  Neurological: Positive for sensory change and weakness. Negative for dizziness and headaches.   Psychiatric/Behavioral: The patient has insomnia.     Past Medical History:  Diagnosis Date  . (HFpEF) heart failure with preserved ejection fraction ([place])    a. 05/2013 Echo: EF 55%, mild LVH, diast dysfxn, Ao sclerosis, mildly dil LA, RV dysfxn (poorly visualized), PASP 57mmHg;  b. 03/2017 Echo: EF 55-60%, no rwma, triv MR, mildly dil RV, mod TR, PASP 37mmHg.  . Atrial fibrillation [facility])    s/p Cox Maze 1/09;  Multaq Rx d/c'd in 2014 due to pulmo fibrosis;  coumadin d/c'd in 2014 due to spontaneous subdural hematoma  . BPH (benign prostatic hyperplasia)   . CAD (coronary artery disease), native coronary artery    a. s/p CABG 12/2007;  b. Myoview 12/2011: EF 66%, no scar or ischemia; normal.  . DM (diabetes mellitus) ([place])   . Hyperlipidemia type II   . Hypertension   . 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 OSA (obstructive sleep apnea)   . Pacemaker    PPM - St. Jude  . Peripheral neuropathy 07/31/2018  . Pulmonary fibrosis ([place])    Multaq d/c'd 7/14  . Subdural hematoma ([place]) 07/2012   spontaneous;  coumadin d/c'd => no longer a candidate for anticoagulation    Past Surgical History:  Procedure Laterality Date  . AMPUTATION Left 05/17/2019   Procedure: AMPUTATION LEFT FOURTH TOE;  Surgeon: Meredith Pel, MD;  Location: [place];  Service: Orthopedics;  Laterality: Left;  . APPENDECTOMY    . CHOLECYSTECTOMY    . CORONARY ARTERY BYPASS GRAFT     x3 (left internal mammary  artery to distal left anterior descending coronary artery, saphenous vain graft to second circumflex marginal branch, saphenous vain graft to posterior descending coronary artery, endoscopic saphenous vain harvest from right thigh) and modified Cox - Maze IV procedure.  [name],MD. Electronically signed CHO/MEDQ D: 01/09/2008/ JOB: [id number] cc:  [name]  . [name]  07/30/2012   Procedure: CRANIOTOMY HEMATOMA EVACUATION SUBDURAL;  Surgeon: [name], MD;   Location: [facility];  Service: Neurosurgery;  Laterality: Right;  Right craniotomy for evacuation of subdural hematoma  . FOOT SURGERY    . HERNIA REPAIR    . ORCHIECTOMY     Left  /  testicular cancer  . PACEMAKER PLACEMENT     PPM - St. Jude    Family History  Problem Relation Age of Onset  . Heart disease Father   . Heart attack Father   . Heart failure Father   . Heart disease Mother   . Alzheimer's disease Mother   . Dementia Mother     Social History:   Married. Lives with wife and was independent PTA. He reports that he was mowing the lawn with push mower (wrecked his riding mower) reports that he quit smoking about 28 years ago. He has never used smokeless tobacco. He reports that he does not drink alcohol or use drugs.   Allergies  Allergen Reactions  . Lipitor [Atorvastatin] Other (See Comments)    Stiff joints  . Nsaids Other (See Comments)    unknown  . Warfarin And Related Other (See Comments)    Stiff joints  . Allopurinol Itching    May or may not be allergic to this (If the patient should find out he is NOT allergic, please omit this entry.)  . Enbrel [Etanercept] Itching    May or may not be allergic to this (If the patient should find out he is NOT allergic, please omit this entry.)    Medications Prior to Admission  Medication Sig Dispense Refill  . allopurinol (ZYLOPRIM) 300 MG tablet Take 1 tablet by mouth daily.    . colchicine 0.6 MG tablet Take 0.6-1.2 mg by mouth See admin instructions. Take 1.2 mg by mouth then 0.6 mg one hour later as needed for gout flares/ as directed.  1  . ferrous sulfate 325 (65 FE) MG tablet Take 325 mg by mouth daily with breakfast.    . furosemide (LASIX) 40 MG tablet Take 2 tablets (80 mg total) by mouth 2 (two) times daily. 360 tablet 3  . metoprolol tartrate (LOPRESSOR) 50 MG tablet Take 1.5 tablets (75 mg total) by mouth 2 (two) times daily. 270 tablet 3  . MYRBETRIQ 50 MG TB24 tablet TAKE 1 TABLET BY MOUTH EVERY  DAY (Patient taking differently: Take 50 mg by mouth daily. ) 30 tablet 3  . pantoprazole (PROTONIX) 40 MG tablet Take 40 mg by mouth daily.    . potassium chloride SA (K-DUR,KLOR-CON) 20 MEQ tablet Take 20 mEq by mouth daily.      . rosuvastatin (CRESTOR) 40 MG tablet Take 1 tablet (40 mg total) by mouth daily. 90 tablet 2  . sertraline (ZOLOFT) 100 MG tablet Take 200 mg by mouth daily.     . Tamsulosin HCl (FLOMAX) 0.4 MG CAPS Take 0.4 mg by mouth at bedtime.       Drug Regimen Review  Drug regimen was reviewed and remains appropriate with no significant issues identified  Home: Home Living Family/patient expects to be discharged  to:: Private residence Living Arrangements: Spouse/significant other Available Help at Discharge: Family, Available 24 hours/day Type of Home: House Home Access: Stairs to enter [employer] of Steps: 3 Entrance Stairs-Rails: Right, Left, Can reach both Home Layout: One level Bathroom Shower/Tub: Gaffer, Door [employer] Toilet: Standard Bathroom Accessibility: Yes Home Equipment: Environmental consultant - 2 wheels, Wheelchair - manual, Bedside commode, Shower seat - built in, [place] - single point   Functional History: Prior Function Level of Independence: Independent with assistive device(s) Comments: Uses SPC for ambulation. Uses RW outside. Lots of falls at home, "hunny, I cannot even count that high."  Drives. Reports working in the yard.  Functional Status:  Mobility: Bed Mobility Overal bed mobility: Needs Assistance Bed Mobility: Supine to Sit, Sit to Supine Rolling: Modified independent (Device/Increase time) Sidelying to sit: Min guard, HOB elevated Supine to sit: Min assist, HOB elevated Sit to supine: Min assist General bed mobility comments: heavy reliance on bedrail. Assist to elevate trunk. Transfers Overall transfer level: Needs assistance Equipment used: Rolling walker (2 wheeled) Transfers: Sit to/from Stand Sit to Stand: Min  assist General transfer comment: cues for hand placement, increased time and effort Ambulation/Gait Ambulation/Gait assistance: Min assist, +2 safety/equipment Gait Distance (Feet): 100 Feet(x 2) Assistive device: Rolling walker (2 wheeled) Gait Pattern/deviations: Step-through pattern, Decreased stride length General Gait Details: decreased foot clearance on L. 5 minute seated rest break between 100-foot gait trials due to fatigue and mild SOB. During seated rest SpO2 98% on RA and HR 113. +2 utilized for chair follow. Gait velocity: decreased Gait velocity interpretation: <1.31 ft/sec, indicative of household ambulator    ADL: ADL Overall ADL's : Needs assistance/impaired Eating/Feeding: Independent, Sitting Grooming: Wash/dry hands, Wash/dry face, Sitting, Set up Upper Body Bathing: Minimal assistance, Sitting Lower Body Bathing: Moderate assistance, Sit to/from stand Upper Body Dressing : Minimal assistance, Sitting Lower Body Dressing: Moderate assistance, Sit to/from stand Toilet Transfer: Minimal assistance, Ambulation, RW Toileting- Clothing Manipulation and Hygiene: Minimal assistance, Sit to/from stand Functional mobility during ADLs: Minimal assistance, Rolling walker  Cognition: Cognition Overall Cognitive Status: Impaired/Different from baseline Orientation Level: Oriented X4 Cognition Arousal/Alertness: Awake/alert Behavior During Therapy: WFL for tasks assessed/performed Overall Cognitive Status: Impaired/Different from baseline Area of Impairment: Memory, Following commands, Safety/judgement, Awareness, Problem solving Orientation Level: Time, Situation Memory: Decreased recall of precautions, Decreased short-term memory Following Commands: Follows one step commands inconsistently, Follows one step commands with increased time Safety/Judgement: Decreased awareness of safety, Decreased awareness of deficits Awareness: Intellectual Problem Solving: Slow  processing, Decreased initiation, Difficulty sequencing, Requires verbal cues, Requires tactile cues General Comments: Pt A&O x 4. Very witty and joking during session. Decreased safety   Blood pressure 109/63, pulse 62, temperature 98.6 F (37 C), temperature source Oral, resp. rate 18, height 5' 8.5" (1.74 m), weight 83.9 kg, SpO2 100 %. Physical Exam  Nursing note and vitals reviewed. Constitutional: He is oriented to person, place, and time. He appears well-developed and well-nourished.  HENT:  Head: Normocephalic and atraumatic.  Eyes: Pupils are equal, round, and reactive to light.  Neck: Normal range of motion.  Cardiovascular: Normal rate and regular rhythm.  Murmur heard. Respiratory: Effort normal. No respiratory distress. He has no wheezes.  GI: Soft. He exhibits no distension. There is no abdominal tenderness.  Musculoskeletal:        General: No edema.     Comments: Dry dressing left foot.   Neurological: He is alert and oriented to person, place, and time. No cranial nerve deficit. He  exhibits normal muscle tone.  Reasonable insight and awareness. Normal language, speech clear. UE 4+/5. LE 3/5 HF, KE and 4-/5 ADF/PF. No focal sensory findings  Skin: Skin is warm and dry.  Left toe wound with moderate s/s drainage.   Psychiatric: He has a normal mood and affect. His behavior is normal.    Results for orders placed or performed during the hospital encounter of 05/15/19 (from the past 48 hour(s))  CBC     Status: Abnormal   Collection Time: 05/25/19  5:33 AM  Result Value Ref Range   WBC 5.8 4.0 - 10.5 K/uL   RBC 3.68 (L) 4.22 - 5.81 MIL/uL   Hemoglobin 10.6 (L) 13.0 - 17.0 g/dL   HCT 33.1 (L) 39.0 - 52.0 %   MCV 89.9 80.0 - 100.0 fL   MCH 28.8 26.0 - 34.0 pg   MCHC 32.0 30.0 - 36.0 g/dL   RDW 15.8 (H) 11.5 - 15.5 %   Platelets 84 (L) 150 - 400 K/uL    Comment: REPEATED TO VERIFY Immature Platelet Fraction may be clinically indicated, consider ordering this  additional test [id number] CONSISTENT WITH PREVIOUS RESULT    nRBC 0.0 0.0 - 0.2 %    Comment: Performed at [facility], [place] [address] [place], [place] [address]  Comprehensive metabolic panel     Status: Abnormal   Collection Time: 05/25/19  5:33 AM  Result Value Ref Range   Sodium 140 135 - 145 mmol/L   Potassium 4.6 3.5 - 5.1 mmol/L   Chloride 105 98 - 111 mmol/L   CO2 28 22 - 32 mmol/L   Glucose, Bld 97 70 - 99 mg/dL   BUN 28 (H) 8 - 23 mg/dL   Creatinine, Ser 1.35 (H) 0.61 - 1.24 mg/dL   Calcium 9.6 8.9 - 10.3 mg/dL   Total Protein 7.3 6.5 - 8.1 g/dL   Albumin 3.3 (L) 3.5 - 5.0 g/dL   AST 35 15 - 41 U/L   ALT 21 0 - 44 U/L   Alkaline Phosphatase 71 38 - 126 U/L   Total Bilirubin 0.7 0.3 - 1.2 mg/dL   GFR calc non Af Amer 51 (L) >60 mL/min   GFR calc Af Amer 59 (L) >60 mL/min   Anion gap 7 5 - 15    Comment: Performed at [facility] [address] [place], [place] [address]  Magnesium     Status: None   Collection Time: 05/25/19  5:33 AM  Result Value Ref Range   Magnesium 2.1 1.7 - 2.4 mg/dL    Comment: Performed at [facility] [address], [place], [place] [address]  Comprehensive metabolic panel     Status: Abnormal   Collection Time: 05/26/19  5:44 AM  Result Value Ref Range   Sodium 139 135 - 145 mmol/L   Potassium 4.4 3.5 - 5.1 mmol/L   Chloride 106 98 - 111 mmol/L   CO2 25 22 - 32 mmol/L   Glucose, Bld 99 70 - 99 mg/dL   BUN 26 (H) 8 - 23 mg/dL   Creatinine, Ser 1.26 (H) 0.61 - 1.24 mg/dL   Calcium 9.5 8.9 - 10.3 mg/dL   Total Protein 7.1 6.5 - 8.1 g/dL   Albumin 3.2 (L) 3.5 - 5.0 g/dL   AST 35 15 - 41 U/L   ALT 21 0 - 44 U/L   Alkaline Phosphatase 75 38 - 126 U/L   Total Bilirubin 0.8 0.3 -  1.2 mg/dL   GFR calc non Af Amer 55 (L) >60 mL/min   GFR calc Af Amer >60 >60 mL/min   Anion gap 8 5 - 15    Comment: Performed at [facility] [address], [place], [place] [address]  CBC     Status: Abnormal   Collection Time:  05/26/19  5:44 AM  Result Value Ref Range   WBC 5.5 4.0 - 10.5 K/uL   RBC 3.62 (L) 4.22 - 5.81 MIL/uL   Hemoglobin 10.2 (L) 13.0 - 17.0 g/dL   HCT 32.6 (L) 39.0 - 52.0 %   MCV 90.1 80.0 - 100.0 fL   MCH 28.2 26.0 - 34.0 pg   MCHC 31.3 30.0 - 36.0 g/dL   RDW 15.7 (H) 11.5 - 15.5 %   Platelets 87 (L) 150 - 400 K/uL    Comment: Immature Platelet Fraction may be clinically indicated, consider ordering this additional test [id number] CONSISTENT WITH PREVIOUS RESULT    nRBC 0.0 0.0 - 0.2 %    Comment: Performed at [facility], [place] [address], [place], [place] [address]  Magnesium     Status: None   Collection Time: 05/26/19  5:44 AM  Result Value Ref Range   Magnesium 2.0 1.7 - 2.4 mg/dL    Comment: Performed at [facility] [address], [place], [place] [address]   No results found.     Medical Problem List and Plan: 1.  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  Antithrombotics: -DVT/anticoagulation:  Mechanical: Sequential compression devices, below knee Bilateral lower extremities  -antiplatelet therapy: ASA 3. Pain Management: tylenol prn  4. Mood: LCSW to follow for evaluation and support.   -antipsychotic agents: N/A 5. Neuropsych: This patient is not fully capable of making decisions on his own behalf. 6. Skin/Wound Care: Routine pressure relief measures.  7. Fluids/Electrolytes/Nutrition:MOnitor I/o. C 8.Left great toe amputation: Monitor for healing.  9. CAD s/p PPM/CAF: Monitor HR tid--continue  ASA and Crestor. Metoprolol resumed on 6/8--monitor for recurrent bradycardia.  10. Pulmonary fibrosis/OSA: Continue to encourage use of CPAP when sleeping.  11. T2DM-diet controlled: Hgb A 1c- 5.6.  Will monitor BS ac/hs and use SSI for elevated BS.  12. Lewy body dementia?:  Hallucinations with STM deficits and falls per notes.  Neurology following with work up underway.  13. [name] with pancytopenia/severe  peripheral neuropathy: Monitor platelets 14. H/p BPH: continue on flomax 15.  Acute Heart failure: Monitor daily weights. Strict I/O--1200 cc FR. Continue Crestor--lasix resumed 6/8.   -Will check orthostatic vitals as reporting dizziness with positional changes.   -daily weights      Bary Leriche, PA-C 05/26/2019

## 2019-05-26 NOTE — H&P (Signed)
Physical Medicine and Rehabilitation Admission H&P        Chief Complaint  Patient presents with   Functional deficits.       HPI: [name] is a 78 year old male with history of CAD, CAF, T2DM. Pulmonary fibrosis, OS Lewy body dementia who was admitted via cardiology office on 05/15/2019 with few months history of progressive S OB, cellulitis left foot, peripheral edema and frequent falls.  He was treated for acute on chronic CHF and started on antibiotics due to left foot cellulitis.  2D echo showed EF of 50 to 55% with septal hypokinesis and moderately enlarged RV.  ABIs without evidence of significant disease but Dr. Marlou Sa recommended left fourth toe amputation which was performed on 05/17/2019.  Patient developed transient diplopia with dizziness on 0.6/3 and CTA head was negative.  MRI not done today to PPM.  Neurology question brainstem infarct versus TIA due to small vessel disease.  Now on aspirin and not on AC due to history of spontaneous SDH as well as gait disorder with falls.    He developed acute renal failure therefore lasix discontinued on 6/2 and metoprolol discontinued on 6/3 due to bradycardia.  He has had issues with anemia with thrombocytopenia due to cirrhosis of the liver.  Platelets with downward trend and being monitored.  AKI resolving with serum creatinine down to 1.26.  Therapy is ongoing and patient continues to be limited by balance deficits with poor safety awareness affecting overall functional status.  CIR recommended for follow-up therapy     Review of Systems  HENT: Positive for hearing loss.   Eyes: Negative for blurred vision.  Respiratory: Negative for shortness of breath and wheezing.   Cardiovascular: Negative for chest pain and leg swelling.  Gastrointestinal: Negative for abdominal pain, heartburn and nausea.  Genitourinary: Positive for frequency and urgency.  Neurological: Positive for sensory change and weakness. Negative for dizziness  and headaches.  Psychiatric/Behavioral: The patient has insomnia.           Past Medical History:  Diagnosis Date   (HFpEF) heart failure with preserved ejection fraction ([place])      a. 05/2013 Echo: EF 55%, mild LVH, diast dysfxn, Ao sclerosis, mildly dil LA, RV dysfxn (poorly visualized), PASP 41mmHg;  b. 03/2017 Echo: EF 55-60%, no rwma, triv MR, mildly dil RV, mod TR, PASP 33mmHg.   Atrial fibrillation ([place])      s/p Cox Maze 1/09;  Multaq Rx d/c'd in 2014 due to pulmo fibrosis;  coumadin d/c'd in 2014 due to spontaneous subdural hematoma   BPH (benign prostatic hyperplasia)     CAD (coronary artery disease), native coronary artery      a. s/p CABG 12/2007;  b. Myoview 12/2011: EF 66%, no scar or ischemia; normal.   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 Jude   Peripheral neuropathy 07/31/2018   Pulmonary fibrosis ([place])      Multaq d/c'd 7/14   Subdural hematoma ([place]) 07/2012    spontaneous;  coumadin d/c'd => no longer a candidate for anticoagulation           Past Surgical History:  Procedure Laterality Date   AMPUTATION Left 05/17/2019    Procedure: AMPUTATION LEFT  FOURTH TOE;  Surgeon: Meredith Pel, MD;  Location: [facility];  Service: Orthopedics;  Laterality: Left;   APPENDECTOMY       CHOLECYSTECTOMY       CORONARY ARTERY BYPASS GRAFT        x3 (left internal mammary artery to distal left anterior descending coronary artery, saphenous vain graft to second circumflex marginal branch, saphenous vain graft to posterior descending coronary artery, endoscopic saphenous vain harvest from right thigh) and modified Cox - Maze IV procedure.  [name],MD. Electronically signed CHO/MEDQ D: 01/09/2008/ JOB: [id number] cc:  [name]   CRANIOTOMY   07/30/2012    Procedure: CRANIOTOMY  HEMATOMA EVACUATION SUBDURAL;  Surgeon: [name], MD;  Location: [facility];  Service: Neurosurgery;  Laterality: Right;  Right craniotomy for evacuation of subdural hematoma   FOOT SURGERY       HERNIA REPAIR       ORCHIECTOMY        Left  /  testicular cancer   PACEMAKER PLACEMENT        PPM - St. Jude           Family History  Problem Relation Age of Onset   Heart disease Father     Heart attack Father     Heart failure Father     Heart disease Mother     Alzheimer's disease Mother     Dementia Mother        Social History:   Married. Lives with wife and was independent PTA. He reports that he was mowing the lawn with push mower (wrecked his riding mower) reports that he quit smoking about 28 years ago. He has never used smokeless tobacco. He reports that he does not drink alcohol or use drugs.          Allergies  Allergen Reactions   Lipitor [Atorvastatin] Other (See Comments)      Stiff joints   Nsaids Other (See Comments)      unknown   Warfarin And Related Other (See Comments)      Stiff joints   Allopurinol Itching      May or may not be allergic to this (If the patient should find out he is NOT allergic, please omit this entry.)   Enbrel [Etanercept] Itching      May or may not be allergic to this (If the patient should find out he is NOT allergic, please omit this entry.)            Medications Prior to Admission  Medication Sig Dispense Refill   allopurinol (ZYLOPRIM) 300 MG tablet Take 1 tablet by mouth daily.       colchicine 0.6 MG tablet Take 0.6-1.2 mg by mouth See admin instructions. Take 1.2 mg by mouth then 0.6 mg one hour later as needed for gout flares/ as directed.   1   ferrous sulfate 325 (65 FE) MG tablet Take 325 mg by mouth daily with breakfast.       furosemide (LASIX) 40 MG tablet Take 2 tablets (80 mg total) by mouth 2 (two) times daily. 360 tablet 3   metoprolol tartrate (LOPRESSOR) 50 MG tablet Take 1.5 tablets (75  mg total) by mouth 2 (two) times daily. 270 tablet 3   MYRBETRIQ 50 MG TB24 tablet TAKE 1 TABLET BY MOUTH EVERY DAY (Patient taking differently: Take 50 mg by mouth daily. ) 30 tablet 3   pantoprazole (PROTONIX) 40 MG tablet Take 40  mg by mouth daily.       potassium chloride SA (K-DUR,KLOR-CON) 20 MEQ tablet Take 20 mEq by mouth daily.         rosuvastatin (CRESTOR) 40 MG tablet Take 1 tablet (40 mg total) by mouth daily. 90 tablet 2   sertraline (ZOLOFT) 100 MG tablet Take 200 mg by mouth daily.        Tamsulosin HCl (FLOMAX) 0.4 MG CAPS Take 0.4 mg by mouth at bedtime.           Drug Regimen Review  Drug regimen was reviewed and remains appropriate with no significant issues identified   Home: Home Living Family/patient expects to be discharged to:: Private residence Living Arrangements: Spouse/significant other Available Help at Discharge: Family, Available 24 hours/day Type of Home: House Home Access: Stairs to enter [employer] of Steps: 3 Entrance Stairs-Rails: Right, Left, Can reach both Home Layout: One level Bathroom Shower/Tub: Gaffer, Door [employer] Toilet: Standard Bathroom Accessibility: Yes Home Equipment: Environmental consultant - 2 wheels, Wheelchair - manual, Bedside commode, Shower seat - built in, [place] - single point   Functional History: Prior Function Level of Independence: Independent with assistive device(s) Comments: Uses SPC for ambulation. Uses RW outside. Lots of falls at home, "hunny, I cannot even count that high."  Drives. Reports working in the yard.   Functional Status:  Mobility: Bed Mobility Overal bed mobility: Needs Assistance Bed Mobility: Supine to Sit, Sit to Supine Rolling: Modified independent (Device/Increase time) Sidelying to sit: Min guard, HOB elevated Supine to sit: Min assist, HOB elevated Sit to supine: Min assist General bed mobility comments: heavy reliance on bedrail. Assist to elevate trunk. Transfers Overall  transfer level: Needs assistance Equipment used: Rolling walker (2 wheeled) Transfers: Sit to/from Stand Sit to Stand: Min assist General transfer comment: cues for hand placement, increased time and effort Ambulation/Gait Ambulation/Gait assistance: Min assist, +2 safety/equipment Gait Distance (Feet): 100 Feet(x 2) Assistive device: Rolling walker (2 wheeled) Gait Pattern/deviations: Step-through pattern, Decreased stride length General Gait Details: decreased foot clearance on L. 5 minute seated rest break between 100-foot gait trials due to fatigue and mild SOB. During seated rest SpO2 98% on RA and HR 113. +2 utilized for chair follow. Gait velocity: decreased Gait velocity interpretation: <1.31 ft/sec, indicative of household ambulator   ADL: ADL Overall ADL's : Needs assistance/impaired Eating/Feeding: Independent, Sitting Grooming: Wash/dry hands, Wash/dry face, Sitting, Set up Upper Body Bathing: Minimal assistance, Sitting Lower Body Bathing: Moderate assistance, Sit to/from stand Upper Body Dressing : Minimal assistance, Sitting Lower Body Dressing: Moderate assistance, Sit to/from stand Toilet Transfer: Minimal assistance, Ambulation, RW Toileting- Clothing Manipulation and Hygiene: Minimal assistance, Sit to/from stand Functional mobility during ADLs: Minimal assistance, Rolling walker   Cognition: Cognition Overall Cognitive Status: Impaired/Different from baseline Orientation Level: Oriented X4 Cognition Arousal/Alertness: Awake/alert Behavior During Therapy: WFL for tasks assessed/performed Overall Cognitive Status: Impaired/Different from baseline Area of Impairment: Memory, Following commands, Safety/judgement, Awareness, Problem solving Orientation Level: Time, Situation Memory: Decreased recall of precautions, Decreased short-term memory Following Commands: Follows one step commands inconsistently, Follows one step commands with increased  time Safety/Judgement: Decreased awareness of safety, Decreased awareness of deficits Awareness: Intellectual Problem Solving: Slow processing, Decreased initiation, Difficulty sequencing, Requires verbal cues, Requires tactile cues General Comments: Pt A&O x 4. Very witty and joking during session. Decreased safety     Blood pressure 109/63, pulse 62, temperature 98.6 F (37 C), temperature source Oral, resp. rate 18, height 5' 8.5" (1.74 m), weight 83.9  kg, SpO2 100 %. Physical Exam  Nursing note and vitals reviewed. Constitutional: He is oriented to person, place, and time. He appears well-developed and well-nourished.  HENT:  Head: Normocephalic and atraumatic.  Eyes: Pupils are equal, round, and reactive to light.  Neck: Normal range of motion.  Cardiovascular: Normal rate and regular rhythm.  Murmur heard. Respiratory: Effort normal. No respiratory distress. He has no wheezes.  GI: Soft. He exhibits no distension. There is no abdominal tenderness.  Musculoskeletal:        General: No edema.     Comments: Dry dressing left foot.   Neurological: He is alert and oriented to person, place, and time. No cranial nerve deficit. He exhibits normal muscle tone.  Reasonable insight and awareness. Normal language, speech clear. UE 4+/5. LE 3/5 HF, KE and 4-/5 ADF/PF. No focal sensory findings  Skin: Skin is warm and dry.  Left toe wound with moderate s/s drainage.   Psychiatric: He has a normal mood and affect. His behavior is normal.      Lab Results Last 48 Hours        Results for orders placed or performed during the hospital encounter of 05/15/19 (from the past 48 hour(s))  CBC     Status: Abnormal    Collection Time: 05/25/19  5:33 AM  Result Value Ref Range    WBC 5.8 4.0 - 10.5 K/uL    RBC 3.68 (L) 4.22 - 5.81 MIL/uL    Hemoglobin 10.6 (L) 13.0 - 17.0 g/dL    HCT 33.1 (L) 39.0 - 52.0 %    MCV 89.9 80.0 - 100.0 fL    MCH 28.8 26.0 - 34.0 pg    MCHC 32.0 30.0 - 36.0 g/dL     RDW 15.8 (H) 11.5 - 15.5 %    Platelets 84 (L) 150 - 400 K/uL      Comment: REPEATED TO VERIFY Immature Platelet Fraction may be clinically indicated, consider ordering this additional test [id number] CONSISTENT WITH PREVIOUS RESULT      nRBC 0.0 0.0 - 0.2 %      Comment: Performed at [facility], [place] [address], [place], [place] [address]  Comprehensive metabolic panel     Status: Abnormal    Collection Time: 05/25/19  5:33 AM  Result Value Ref Range    Sodium 140 135 - 145 mmol/L    Potassium 4.6 3.5 - 5.1 mmol/L    Chloride 105 98 - 111 mmol/L    CO2 28 22 - 32 mmol/L    Glucose, Bld 97 70 - 99 mg/dL    BUN 28 (H) 8 - 23 mg/dL    Creatinine, Ser 1.35 (H) 0.61 - 1.24 mg/dL    Calcium 9.6 8.9 - 10.3 mg/dL    Total Protein 7.3 6.5 - 8.1 g/dL    Albumin 3.3 (L) 3.5 - 5.0 g/dL    AST 35 15 - 41 U/L    ALT 21 0 - 44 U/L    Alkaline Phosphatase 71 38 - 126 U/L    Total Bilirubin 0.7 0.3 - 1.2 mg/dL    GFR calc non Af Amer 51 (L) >60 mL/min    GFR calc Af Amer 59 (L) >60 mL/min    Anion gap 7 5 - 15      Comment: Performed at [facility] [address] [place], [place] [address]  Magnesium     Status: None    Collection Time: 05/25/19  5:33 AM  Result Value Ref Range    Magnesium 2.1 1.7 - 2.4 mg/dL      Comment: Performed at [facility], [place] [address], [place], [place] [address]  Comprehensive metabolic panel     Status: Abnormal    Collection Time: 05/26/19  5:44 AM  Result Value Ref Range    Sodium 139 135 - 145 mmol/L    Potassium 4.4 3.5 - 5.1 mmol/L    Chloride 106 98 - 111 mmol/L    CO2 25 22 - 32 mmol/L    Glucose, Bld 99 70 - 99 mg/dL    BUN 26 (H) 8 - 23 mg/dL    Creatinine, Ser 1.26 (H) 0.61 - 1.24 mg/dL    Calcium 9.5 8.9 - 10.3 mg/dL    Total Protein 7.1 6.5 - 8.1 g/dL    Albumin 3.2 (L) 3.5 - 5.0 g/dL    AST 35 15 - 41 U/L    ALT 21 0 - 44 U/L    Alkaline Phosphatase 75 38 - 126 U/L    Total Bilirubin 0.8 0.3 - 1.2 mg/dL    GFR  calc non Af Amer 55 (L) >60 mL/min    GFR calc Af Amer >60 >60 mL/min    Anion gap 8 5 - 15      Comment: Performed at [facility] [address] [place], [place] [address]  CBC     Status: Abnormal    Collection Time: 05/26/19  5:44 AM  Result Value Ref Range    WBC 5.5 4.0 - 10.5 K/uL    RBC 3.62 (L) 4.22 - 5.81 MIL/uL    Hemoglobin 10.2 (L) 13.0 - 17.0 g/dL    HCT 32.6 (L) 39.0 - 52.0 %    MCV 90.1 80.0 - 100.0 fL    MCH 28.2 26.0 - 34.0 pg    MCHC 31.3 30.0 - 36.0 g/dL    RDW 15.7 (H) 11.5 - 15.5 %    Platelets 87 (L) 150 - 400 K/uL      Comment: Immature Platelet Fraction may be clinically indicated, consider ordering this additional test [id number] CONSISTENT WITH PREVIOUS RESULT      nRBC 0.0 0.0 - 0.2 %      Comment: Performed at [facility], [place] [address], [place], [place] [address]  Magnesium     Status: None    Collection Time: 05/26/19  5:44 AM  Result Value Ref Range    Magnesium 2.0 1.7 - 2.4 mg/dL      Comment: Performed at [facility] [address] [place], [place] [address]      Imaging Results (Last 48 hours)  No results found.           Medical Problem List and Plan: 1.  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  Antithrombotics: -DVT/anticoagulation:  Mechanical: Sequential compression devices, below knee Bilateral lower extremities             -antiplatelet therapy: ASA 3. Pain Management: tylenol prn  4. Mood: LCSW to follow for evaluation and support.              -antipsychotic agents: N/A 5. Neuropsych: This patient is not fully capable of making decisions on his own behalf. 6. Skin/Wound Care: Routine pressure relief measures.  7. Fluids/Electrolytes/Nutrition:MOnitor I/o. C 8.Left great toe amputation: Monitor for  healing.  9. CAD s/p PPM/CAF: Monitor HR tid--continue  ASA and Crestor. Metoprolol resumed on 6/8--monitor for recurrent bradycardia.  10.  Pulmonary fibrosis/OSA: Continue to encourage use of CPAP when sleeping.  11. T2DM-diet controlled: Hgb A 1c- 5.6.  Will monitor BS ac/hs and use SSI for elevated BS.  12. Lewy body dementia?:  Hallucinations with STM deficits and falls per notes.    Neurology following with work up underway.  13. [name] with pancytopenia/severe peripheral neuropathy: Monitor platelets 14. H/p BPH: continue on flomax 15.  Acute Heart failure: Monitor daily weights. Strict I/O--1200 cc FR. Continue Crestor--lasix resumed 6/8.              -Will check orthostatic vitals as reporting dizziness with positional changes.              -daily weights        Post Admission Physician Evaluation: 1. Functional deficits secondary  to debility, brainstem CVA. 2. Patient is admitted to receive collaborative, interdisciplinary care between the physiatrist, rehab nursing staff, and therapy team. 3. Patient's level of medical complexity and substantial therapy needs in context of that medical necessity cannot be provided at a lesser intensity of care such as a SNF. 4. Patient has experienced substantial functional loss from his/her baseline which was documented above under the "Functional History" and "Functional Status" headings.  Judging by the patient's diagnosis, physical exam, and functional history, the patient has potential for functional progress which will result in measurable gains while on inpatient rehab.  These gains will be of substantial and practical use upon discharge  in facilitating mobility and self-care at the household level. 5. Physiatrist will provide 24 hour management of medical needs as well as oversight of the therapy plan/treatment and provide guidance as appropriate regarding the interaction of the two. 6. The Preadmission Screening has been reviewed and patient status is unchanged unless otherwise stated above. 7. 24 hour rehab nursing will assist with bladder management, bowel management, safety,  skin/wound care, disease management, medication administration, pain management and patient education  and help integrate therapy concepts, techniques,education, etc. 8. PT will assess and treat for/with: Lower extremity strength, range of motion, stamina, balance, functional mobility, safety, adaptive techniques and equipment .   Goals are: mod I. 9. OT will assess and treat for/with: ADL's, functional mobility, safety, upper extremity strength, adaptive techniques and equipment.   Goals are: mod I. Therapy may proceed with showering this patient. 10. SLP will assess and treat for/with: n/a.  Goals are: n/a. 11. Case Management and Social Worker will assess and treat for psychological issues and discharge planning. 12. Team conference will be held weekly to assess progress toward goals and to determine barriers to discharge. 13. Patient will receive at least 3 hours of therapy per day at least 5 days per week. 14. ELOS: 7-10 days       15. Prognosis:  excellent   I have personally performed a face to face diagnostic evaluation of this patient and formulated the key components of the plan.  Additionally, I have personally reviewed laboratory data, imaging studies, as well as relevant notes and concur with the physician assistant's documentation above.  Meredith Staggers, MD, Mellody Drown   Bary Leriche, PA-C 05/26/2019

## 2019-05-26 NOTE — Progress Notes (Signed)
Patient transferred to unit by nurse and nurse with all personal belongings. Patient oriented to room and unit. Call light with in reach. All patients questions answered. Nicholes Rough, RN

## 2019-05-26 NOTE — Progress Notes (Signed)
Michel Santee, PT  Rehab Admission Coordinator  Physical Medicine and Rehabilitation  PMR Pre-admission  Signed  Date of Service:  05/26/2019 1:01 PM       Related encounter: ED to Hosp-Admission (Discharged) from 05/15/2019 in [facility]      Signed         Show:Clear all [x] Manual[x] Template[x] Copied  Added by: [x] Michel Santee, PT  [] Hover for details PMR Admission Coordinator Pre-Admission Assessment  Patient: [name]. is an 78 y.o., male MRN: [id number] DOB: 11-23-1941 Height: 5' 8.5" (174 cm) Weight: 83.9 kg                                                                                                                                                  Insurance Information HMO:     PPO:      PCP:      IPA:      80/20:      OTHER:  PRIMARY: Medicare A and B      Policy#: [id number]      Subscriber: patient CM Name:       Phone#:      Fax#:  Pre-Cert#: verified via passport one      Employer:  Benefits:  Phone #:      Name:  Eff. Date: Part A 12/19/2007, Part B 12/18/2010     Deduct: $1408      Out of Pocket Max: n/a      Life Max: n/a CIR: 100%      SNF: 20 full days Outpatient: 80%     Co-Pay: 20% Home Health: 100%      Co-Pay:  DME: 80%     Co-Pay: 20% Providers: pt choice  SECONDARY:       Policy#:       Subscriber:  CM Name:       Phone#:      Fax#:  Pre-Cert#:       Employer:  Benefits:  Phone #:      Name:  Eff. Date:      Deduct:       Out of Pocket Max:       Life Max:  CIR:       SNF:  Outpatient:      Co-Pay:  Home Health:       Co-Pay:  DME:      Co-Pay:   Medicaid Application Date:       Case Manager:  Disability Application Date:       Case Worker:   The "Data Collection Information Summary" for patients in Inpatient Rehabilitation Facilities with attached "Privacy Act [place] Records" was provided and verbally reviewed with: Patient and Family  Emergency Contact Information   Contact Information    Name Relation Home Work [name] Spouse [phone]  [name] Daughter   [phone]     Current Medical History  Patient Admitting Diagnosis: brainstem CVA, debility  History of Present Illness: [name].is a 78 y.o.malewith history of CAD, CAF, T2DM, [name], Pulmonary fibrosis/OSA, h/o SDH, lewy body dementia who was admitted via cardiology office on 05/15/19 with few months history of progressive SOB, peripheral edema and frequent falls . He was treated for acute on chronic CHF and started on antibiotics due to left foot cellulitis. 2 D echo with EF 50-55% septal hypokinesis and moderately enlarged RV. ABI without evidence of significant disease. Dr. 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 Patient with chronic anemia and thrombocytopenia with cirrhosis of liver and H/H platelets being monitored. He developed dizziness with transient diplopia on 6/3 and CTA negative. MRI not done due to PPM and neurology questioned small brain stem infarct v/s TIA due to small vessel disease. Now on ASA but not on AC due to history of spontaneous SDH as well as gait disorder with falls. Therapy ongoing and patient limited gait disorder, BLE instability and poor safety awareness. Family requesting CIR due to debility.   Past Medical History      Past Medical History:  Diagnosis Date  . (HFpEF) heart failure with preserved ejection fraction ([place])    a. 05/2013 Echo: EF 55%, mild LVH, diast dysfxn, Ao sclerosis, mildly dil LA, RV dysfxn (poorly visualized), PASP 56mmHg;  b. 03/2017 Echo: EF 55-60%, no rwma, triv MR, mildly dil RV, mod TR, PASP 74mmHg.  . Atrial fibrillation [facility])    s/p [facility] 1/09;  Multaq Rx d/c'd in 2014 due to pulmo fibrosis;  coumadin d/c'd in 2014 due to spontaneous subdural hematoma  . BPH (benign prostatic hyperplasia)   . CAD (coronary artery disease), native  coronary artery    a. s/p CABG 12/2007;  b. Myoview 12/2011: EF 66%, no scar or ischemia; normal.  . DM (diabetes mellitus) ([place])   . Hyperlipidemia type II   . Hypertension   . 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 OSA (obstructive sleep apnea)   . Pacemaker    PPM - St[employer] Jude  . Peripheral neuropathy 07/31/2018  . Pulmonary fibrosis ([place])    Multaq d/c'd 7/14  . Subdural hematoma ([place]) 07/2012   spontaneous;  coumadin d/c'd => no longer a candidate for anticoagulation    Family History  family history includes Alzheimer's disease in his mother; Dementia in his mother; Heart attack in his father; Heart disease in his father and mother; Heart failure in his father.  Prior Rehab/Hospitalizations:  Has the patient had prior rehab or hospitalizations prior to admission? No  Has the patient had major surgery during 100 days prior to admission? Yes  Current Medications   Current Facility-Administered Medications:  .  0.9 %  sodium chloride infusion, , Intravenous, PRN, Marylyn Ishihara, Tyrone A, DO, Last Rate: 75 mL/hr at 05/20/19 1722 .  acetaminophen (TYLENOL) tablet 650 mg, 650 mg, Oral, Q6H PRN, 650 mg at 05/17/19 0730 **OR** acetaminophen (TYLENOL) suppository 650 mg, 650 mg, Rectal, Q6H PRN, Meredith Pel, MD .  allopurinol (ZYLOPRIM) tablet 300 mg, 300 mg, Oral, Daily, Marlou Sa Tonna Corner, MD, 300 mg at 05/26/19 0914 .  aspirin EC tablet 81 mg, 81 mg, Oral, Daily, Rosalin Hawking, MD, 81 mg at 05/26/19 0914 .  docusate sodium (COLACE) capsule 100 mg, 100 mg, Oral, BID, Marlou Sa, Tonna Corner, MD, 100 mg  at 05/26/19 0914 .  ferrous sulfate tablet 325 mg, 325 mg, Oral, Q breakfast, Marlou Sa Tonna Corner, MD, 325 mg at 05/26/19 0914 .  gabapentin (NEURONTIN) capsule 300 mg, 300 mg, Oral, TID, Marlou Sa Tonna Corner, MD, 300 mg at 05/26/19 0914 .  HYDROcodone-acetaminophen (NORCO/VICODIN) 5-325 MG per tablet 1-2 tablet, 1-2 tablet, Oral, Q4H PRN, Marlou Sa  Tonna Corner, MD, 2 tablet at 05/19/19 0238 .  lactated ringers infusion, , Intravenous, Continuous, Marlou Sa Tonna Corner, MD, Last Rate: 10 mL/hr at 05/20/19 2209 .  metoCLOPramide (REGLAN) tablet 5-10 mg, 5-10 mg, Oral, Q8H PRN **OR** metoCLOPramide (REGLAN) injection 5-10 mg, 5-10 mg, Intravenous, Q8H PRN, Meredith Pel, MD .  metoprolol tartrate (LOPRESSOR) tablet 25 mg, 25 mg, Oral, BID, Elodia Florence., MD .  mirabegron ER [facility]) tablet 50 mg, 50 mg, Oral, Daily, Marlou Sa Tonna Corner, MD, 50 mg at 05/26/19 0914 .  ondansetron (ZOFRAN) tablet 4 mg, 4 mg, Oral, Q6H PRN **OR** ondansetron (ZOFRAN) injection 4 mg, 4 mg, Intravenous, Q6H PRN, Meredith Pel, MD .  pantoprazole (PROTONIX) EC tablet 40 mg, 40 mg, Oral, Daily, Marlou Sa Tonna Corner, MD, 40 mg at 05/26/19 0914 .  potassium chloride SA (K-DUR) CR tablet 20 mEq, 20 mEq, Oral, Daily, Marlou Sa, Tonna Corner, MD, 20 mEq at 05/26/19 0914 .  rosuvastatin (CRESTOR) tablet 40 mg, 40 mg, Oral, Daily, Marlou Sa Tonna Corner, MD, 40 mg at 05/26/19 0914 .  sertraline (ZOLOFT) tablet 200 mg, 200 mg, Oral, Daily, Marlou Sa, Tonna Corner, MD, 200 mg at 05/26/19 0914 .  sodium chloride flush (NS) 0.9 % injection 3 mL, 3 mL, Intravenous, Once, Marlou Sa, Tonna Corner, MD .  tamsulosin [facility]) capsule 0.4 mg, 0.4 mg, Oral, QHS, Marlou Sa Tonna Corner, MD, 0.4 mg at 05/25/19 2123  Patients Current Diet:     Diet Order                  Diet heart healthy/carb modified Room service appropriate? Yes; Fluid consistency: Thin; Fluid restriction: 1200 mL Fluid  Diet effective now               Precautions / Restrictions Precautions Precautions: Fall Other Brace: post op shoe--L Restrictions Weight Bearing Restrictions: No LLE Weight Bearing: Weight bearing as tolerated   Has the patient had 2 or more falls or a fall with injury in the past year?Yes  Prior Activity Level Limited Community (1-2x/wk): still driving, lots of falls,  works in the yard  Prior Functional Level Prior Function Level of Independence: Independent with assistive device(s) Comments: Uses SPC for ambulation. Uses RW outside. Lots of falls at home, "hunny, I cannot even count that high."  Drives. Reports working in the yard.  Self Care: Did the patient need help bathing, dressing, using the toilet or eating?  Independent  Indoor Mobility: Did the patient need assistance with walking from room to room (with or without device)? Independent, but with lots of falls  Stairs: Did the patient need assistance with internal or external stairs (with or without device)? Independent  Functional Cognition: Did the patient need help planning regular tasks such as shopping or remembering to take medications? Needed some help  Home Assistive Devices / [place] Devices/Equipment: Kasandra Knudsen (specify quad or straight)(straight) Home Equipment: Walker - 2 wheels, Wheelchair - manual, Bedside commode, Shower seat - built in, Ong - single point  Prior Device Use: Indicate devices/aids used by the patient prior to current illness, exacerbation or injury? cane  Current Functional Level Cognition  Overall Cognitive Status: Impaired/Different from baseline Orientation Level: Oriented X4 Following Commands: Follows one step commands inconsistently, Follows one step commands with increased time Safety/Judgement: Decreased awareness of safety, Decreased awareness of deficits General Comments: Pt A&O x 4. Very witty and joking during session. Decreased safety    Extremity Assessment (includes Sensation/Coordination)  Upper Extremity Assessment: Overall WFL for tasks assessed  Lower Extremity Assessment: Defer to PT evaluation    ADLs  Overall ADL's : Needs assistance/impaired Eating/Feeding: Independent, Sitting Grooming: Wash/dry hands, Wash/dry face, Sitting, Set up Upper Body Bathing: Minimal assistance, Sitting Lower Body Bathing:  Moderate assistance, Sit to/from stand Upper Body Dressing : Minimal assistance, Sitting Lower Body Dressing: Moderate assistance, Sit to/from stand Toilet Transfer: Minimal assistance, Ambulation, RW Toileting- Clothing Manipulation and Hygiene: Minimal assistance, Sit to/from stand Functional mobility during ADLs: Minimal assistance, Rolling walker    Mobility  Overal bed mobility: Needs Assistance Bed Mobility: Supine to Sit, Sit to Supine Rolling: Modified independent (Device/Increase time) Sidelying to sit: Min guard, HOB elevated Supine to sit: Min assist, HOB elevated Sit to supine: Min assist General bed mobility comments: heavy reliance on bedrail. Assist to elevate trunk.    Transfers  Overall transfer level: Needs assistance Equipment used: Rolling walker (2 wheeled) Transfers: Sit to/from Stand Sit to Stand: Min assist General transfer comment: cues for hand placement, increased time and effort    Ambulation / Gait / Stairs / Wheelchair Mobility  Ambulation/Gait Ambulation/Gait assistance: Min assist, +2 safety/equipment Gait Distance (Feet): 100 Feet(x 2) Assistive device: Rolling walker (2 wheeled) Gait Pattern/deviations: Step-through pattern, Decreased stride length General Gait Details: decreased foot clearance on L. 5 minute seated rest break between 100-foot gait trials due to fatigue and mild SOB. During seated rest SpO2 98% on RA and HR 113. +2 utilized for chair follow. Gait velocity: decreased Gait velocity interpretation: <1.31 ft/sec, indicative of household ambulator    Posture / Balance Balance Overall balance assessment: Needs assistance Sitting-balance support: Feet supported, No upper extremity supported Sitting balance-Leahy Scale: Good Standing balance support: Bilateral upper extremity supported, During functional activity Standing balance-Leahy Scale: Poor Standing balance comment: reliant on RW    Special needs/care consideration  BiPAP/CPAP no CPM no Continuous Drip IV no Dialysis no        Days n/a Life Vest no Oxygen no Special Bed no Trach Size no Wound Vac (area) no      Location n/a Skin incision to L toe amp, ecchymosis and abrasions  Bowel mgmt: continent, last BM 05/24/2019 Bladder mgmt: condom cath Diabetic mgmt no Behavioral consideration no Chemo/radiation no     Previous Home Environment (from acute therapy documentation) Living Arrangements: Spouse/significant other Available Help at Discharge: Family, Available 24 hours/day Type of Home: House Home Layout: One level Home Access: Stairs to enter Entrance Stairs-Rails: Right, Left, Can reach both Entrance Stairs-Number of Steps: 3 Bathroom Shower/Tub: Gaffer, Charity fundraiser: Associate Professor Accessibility: Yes Home Care Services: No  Discharge Living Setting Plans for Discharge Living Setting: Patient's home Type of Home at Discharge: House Discharge Home Layout: One level Discharge Home Access: Stairs to enter Entrance Stairs-Rails: (bilateral grab bars from garage entrance) Entrance Stairs-Number of Steps: 3 Discharge Bathroom Shower/Tub: Walk-in shower Discharge Bathroom Toilet: Standard Discharge Bathroom Accessibility: Yes How Accessible: Accessible via walker Does the patient have any problems obtaining your medications?: No  Social/Family/Support Systems Patient Roles: Spouse Anticipated Caregiver: pt's wife, [name] Anticipated Ambulance person Information: [phone] (c), [phone] (h) Ability/Limitations of Caregiver: min assist  Caregiver Availability: 24/7 Discharge Plan Discussed with Primary Caregiver: Yes Is Caregiver In Agreement with Plan?: Yes Does Caregiver/Family have Issues with Lodging/Transportation while Pt is in Rehab?: No   Goals/Additional Needs Patient/Family Goal for Rehab: PT/OT mod I, n/a SLP Expected length of stay: 7-10  Dietary Needs: carb modified, heart healthy,  1200 mL fluid restriction Equipment Needs: tbd Pt/Family Agrees to Admission and willing to participate: Yes Program Orientation Provided & Reviewed with Pt/Caregiver Including Roles  & Responsibilities: Yes   Possible need for SNF placement upon discharge:no   Patient Condition: This patient's condition remains as documented in the consult dated 05/26/2019, in which the Rehabilitation Physician determined and documented that the patient's condition is appropriate for intensive rehabilitative care in an inpatient rehabilitation facility. Will admit to inpatient rehab today.  Preadmission Screen Completed By:  Michel Santee, PT, DPT 05/26/2019 1:01 PM ______________________________________________________________________   Discussed status with Dr. Naaman Plummer on 05/26/19 at 1:08 PM  and received approval for admission today.  Admission Coordinator:  Michel Santee, PT, DPT time 1:08 PM Sudie Grumbling 05/26/19          Cosigned by: Meredith Staggers, MD at 05/26/2019 1:10 PM  Revision History

## 2019-05-26 NOTE — Consult Note (Signed)
Physical Medicine and Rehabilitation Consult  Reason for Consult: Debility.  Referring Physician: Dr. Florene Glen.    HPI: [name]. is a 78 y.o. male with history of CAD, CAF, T2DM, [name], Pulmonary fibrosis/OSA, h/o SDH, lewy body dementia who was admitted via cardiology office on 05/15/19 with few months history of progressive SOB, peripheral edema and frequent falls . He was treated for acute on chronic CHF and started on antibiotics due to left foot cellulitis.  2  D echo with EF 50-55% septal hypokinesis and moderately enlarged RV. ABI without evidence of significant disease. Dr. 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  Patient with chronic anemia and thrombocytopenia with cirrhosis of liver and H/H platelets being monitored.  He developed dizziness with transient diplopia on 6/3 and CTA negative. MRI not done due to PPM and neurology questioned small brain stem infarct v/s TIA due to small vessel disease. Now on ASA but not on AC due to history of spontaneous SDH as well as gait disorder with falls. Therapy ongoing and patient limited gait disorder, BLE instability and poor safety awareness. Family requesting CIR due to debility.    Review of Systems  Constitutional: Negative for chills and fever.  HENT: Positive for hearing loss.   Respiratory: Negative for cough and shortness of breath.        DOE  Cardiovascular: Negative for chest pain, palpitations and leg swelling.       Orthostatic changes with standing-resolve in a few minutes.   Musculoskeletal: Negative for myalgias.  Skin: Negative for rash.  Neurological: Negative for dizziness and headaches.  Psychiatric/Behavioral: Positive for memory loss.      Past Medical History:  Diagnosis Date  . (HFpEF) heart failure with preserved ejection fraction ([place])    a. 05/2013 Echo: EF 55%, mild LVH, diast dysfxn, Ao sclerosis, mildly dil LA, RV dysfxn (poorly visualized), PASP 32mmHg;  b.  03/2017 Echo: EF 55-60%, no rwma, triv MR, mildly dil RV, mod TR, PASP 58mmHg.  . Atrial fibrillation [facility])    s/p Cox Maze 1/09;  Multaq Rx d/c'd in 2014 due to pulmo fibrosis;  coumadin d/c'd in 2014 due to spontaneous subdural hematoma  . BPH (benign prostatic hyperplasia)   . CAD (coronary artery disease), native coronary artery    a. s/p CABG 12/2007;  b. Myoview 12/2011: EF 66%, no scar or ischemia; normal.  . DM (diabetes mellitus) ([place])   . Hyperlipidemia type II   . Hypertension   . 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 OSA (obstructive sleep apnea)   . Pacemaker    PPM - St. Jude  . Peripheral neuropathy 07/31/2018  . Pulmonary fibrosis ([place])    Multaq d/c'd 7/14  . Subdural hematoma ([place]) 07/2012   spontaneous;  coumadin d/c'd => no longer a candidate for anticoagulation    Past Surgical History:  Procedure Laterality Date  . AMPUTATION Left 05/17/2019   Procedure: AMPUTATION LEFT FOURTH TOE;  Surgeon: Meredith Pel, MD;  Location: [facility];  Service: Orthopedics;  Laterality: Left;  . APPENDECTOMY    . CHOLECYSTECTOMY    . CORONARY ARTERY BYPASS GRAFT     x3 (left internal mammary artery to distal left anterior descending coronary artery, saphenous vain graft to second circumflex marginal branch, saphenous vain graft to posterior descending coronary artery, endoscopic saphenous vain harvest from right thigh) and modified Cox - Maze IV procedure.  Braulio Conte  Lavonia Dana Electronically signed CHO/MEDQ D: 01/09/2008/ JOB: [id number] cc:  [name]  . [name]  07/30/2012   Procedure: CRANIOTOMY HEMATOMA EVACUATION SUBDURAL;  Surgeon: [name], MD;  Location: [facility];  Service: Neurosurgery;  Laterality: Right;  Right craniotomy for evacuation of subdural hematoma  . FOOT SURGERY    . HERNIA REPAIR    . ORCHIECTOMY     Left  /  testicular cancer  . PACEMAKER PLACEMENT     PPM - St. Jude    Family History  Problem Relation Age  of Onset  . Heart disease Father   . Heart attack Father   . Heart failure Father   . Heart disease Mother   . Alzheimer's disease Mother   . Dementia Mother     Social History:  Married. Lives with wife--was independent PTA. Reports was moving lawn with push mover--wrecked his riding mower. He reports that he quit smoking about 28 years ago. He has never used smokeless tobacco. He reports that he does not drink alcohol or use drugs.    Allergies  Allergen Reactions  . Lipitor [Atorvastatin] Other (See Comments)    Stiff joints  . Nsaids Other (See Comments)    unknown  . Warfarin And Related Other (See Comments)    Stiff joints  . Allopurinol Itching    May or may not be allergic to this (If the patient should find out he is NOT allergic, please omit this entry.)  . Enbrel [Etanercept] Itching    May or may not be allergic to this (If the patient should find out he is NOT allergic, please omit this entry.)    Medications Prior to Admission  Medication Sig Dispense Refill  . allopurinol (ZYLOPRIM) 300 MG tablet Take 1 tablet by mouth daily.    . colchicine 0.6 MG tablet Take 0.6-1.2 mg by mouth See admin instructions. Take 1.2 mg by mouth then 0.6 mg one hour later as needed for gout flares/ as directed.  1  . ferrous sulfate 325 (65 FE) MG tablet Take 325 mg by mouth daily with breakfast.    . furosemide (LASIX) 40 MG tablet Take 2 tablets (80 mg total) by mouth 2 (two) times daily. 360 tablet 3  . metoprolol tartrate (LOPRESSOR) 50 MG tablet Take 1.5 tablets (75 mg total) by mouth 2 (two) times daily. 270 tablet 3  . MYRBETRIQ 50 MG TB24 tablet TAKE 1 TABLET BY MOUTH EVERY DAY (Patient taking differently: Take 50 mg by mouth daily. ) 30 tablet 3  . pantoprazole (PROTONIX) 40 MG tablet Take 40 mg by mouth daily.    . potassium chloride SA (K-DUR,KLOR-CON) 20 MEQ tablet Take 20 mEq by mouth daily.      . rosuvastatin (CRESTOR) 40 MG tablet Take 1 tablet (40 mg total) by mouth  daily. 90 tablet 2  . sertraline (ZOLOFT) 100 MG tablet Take 200 mg by mouth daily.     . Tamsulosin HCl (FLOMAX) 0.4 MG CAPS Take 0.4 mg by mouth at bedtime.       Home: Home Living Family/patient expects to be discharged to:: Private residence Living Arrangements: Spouse/significant other Available Help at Discharge: Family, Available 24 hours/day Type of Home: House Home Access: Stairs to enter [employer] of Steps: 3 Entrance Stairs-Rails: Right, Left, Can reach both Home Layout: One level Bathroom Shower/Tub: Gaffer, Door [employer] Toilet: Standard Bathroom Accessibility: Yes Home Equipment: Environmental consultant - 2 wheels, Wheelchair - manual, Bedside commode, [employer]  seat - built in, [place] - single point  Functional History: Prior Function Level of Independence: Independent with assistive device(s) Comments: Uses SPC for ambulation. Uses RW outside. Lots of falls at home, "hunny, I cannot even count that high."  Drives. Reports working in the yard. Functional Status:  Mobility: Bed Mobility Overal bed mobility: Needs Assistance Bed Mobility: Supine to Sit, Sit to Supine Rolling: Modified independent (Device/Increase time) Sidelying to sit: Min guard, HOB elevated Supine to sit: Min assist, HOB elevated Sit to supine: Min assist General bed mobility comments: heavy reliance on bedrail. Assist to elevate trunk. Transfers Overall transfer level: Needs assistance Equipment used: Rolling walker (2 wheeled) Transfers: Sit to/from Stand Sit to Stand: Min assist General transfer comment: cues for hand placement, increased time and effort Ambulation/Gait Ambulation/Gait assistance: Min assist, +2 safety/equipment Gait Distance (Feet): 100 Feet(x 2) Assistive device: Rolling walker (2 wheeled) Gait Pattern/deviations: Step-through pattern, Decreased stride length General Gait Details: decreased foot clearance on L. 5 minute seated rest break between 100-foot gait trials due  to fatigue and mild SOB. During seated rest SpO2 98% on RA and HR 113. +2 utilized for chair follow. Gait velocity: decreased Gait velocity interpretation: <1.31 ft/sec, indicative of household ambulator    ADL: ADL Overall ADL's : Needs assistance/impaired Eating/Feeding: Independent, Sitting Grooming: Wash/dry hands, Wash/dry face, Sitting, Set up Upper Body Bathing: Minimal assistance, Sitting Lower Body Bathing: Moderate assistance, Sit to/from stand Upper Body Dressing : Minimal assistance, Sitting Lower Body Dressing: Moderate assistance, Sit to/from stand Toilet Transfer: Minimal assistance, Ambulation, RW Toileting- Clothing Manipulation and Hygiene: Minimal assistance, Sit to/from stand Functional mobility during ADLs: Minimal assistance, Rolling walker  Cognition: Cognition Overall Cognitive Status: Impaired/Different from baseline Orientation Level: Oriented X4 Cognition Arousal/Alertness: Awake/alert Behavior During Therapy: WFL for tasks assessed/performed Overall Cognitive Status: Impaired/Different from baseline Area of Impairment: Memory, Following commands, Safety/judgement, Awareness, Problem solving Orientation Level: Time, Situation Memory: Decreased recall of precautions, Decreased short-term memory Following Commands: Follows one step commands inconsistently, Follows one step commands with increased time Safety/Judgement: Decreased awareness of safety, Decreased awareness of deficits Awareness: Intellectual Problem Solving: Slow processing, Decreased initiation, Difficulty sequencing, Requires verbal cues, Requires tactile cues General Comments: Pt A&O x 4. Very witty and joking during session. Decreased safety   Blood pressure 109/63, pulse 62, temperature 98.6 F (37 C), temperature source Oral, resp. rate 18, height 5' 8.5" (1.74 m), weight 83.9 kg, SpO2 100 %. Physical Exam  Nursing note and vitals reviewed. Constitutional: He is oriented to person,  place, and time. He appears well-developed and well-nourished. No distress.  WNWD. Was walking down the hall to room with therapy.   Musculoskeletal:        General: No edema.  Neurological: He is alert and oriented to person, place, and time.  Speech clear. Tangential and has poor awareness of deficits. .   Skin: Skin is warm and dry. He is not diaphoretic.  BLE with dry flaky skin and dry compressive dressing on left foot.     Results for orders placed or performed during the hospital encounter of 05/15/19 (from the past 24 hour(s))  Comprehensive metabolic panel     Status: Abnormal   Collection Time: 05/26/19  5:44 AM  Result Value Ref Range   Sodium 139 135 - 145 mmol/L   Potassium 4.4 3.5 - 5.1 mmol/L   Chloride 106 98 - 111 mmol/L   CO2 25 22 - 32 mmol/L   Glucose, Bld 99 70 - 99 mg/dL  BUN 26 (H) 8 - 23 mg/dL   Creatinine, Ser 1.26 (H) 0.61 - 1.24 mg/dL   Calcium 9.5 8.9 - 10.3 mg/dL   Total Protein 7.1 6.5 - 8.1 g/dL   Albumin 3.2 (L) 3.5 - 5.0 g/dL   AST 35 15 - 41 U/L   ALT 21 0 - 44 U/L   Alkaline Phosphatase 75 38 - 126 U/L   Total Bilirubin 0.8 0.3 - 1.2 mg/dL   GFR calc non Af Amer 55 (L) >60 mL/min   GFR calc Af Amer >60 >60 mL/min   Anion gap 8 5 - 15  CBC     Status: Abnormal   Collection Time: 05/26/19  5:44 AM  Result Value Ref Range   WBC 5.5 4.0 - 10.5 K/uL   RBC 3.62 (L) 4.22 - 5.81 MIL/uL   Hemoglobin 10.2 (L) 13.0 - 17.0 g/dL   HCT 32.6 (L) 39.0 - 52.0 %   MCV 90.1 80.0 - 100.0 fL   MCH 28.2 26.0 - 34.0 pg   MCHC 31.3 30.0 - 36.0 g/dL   RDW 15.7 (H) 11.5 - 15.5 %   Platelets 87 (L) 150 - 400 K/uL   nRBC 0.0 0.0 - 0.2 %  Magnesium     Status: None   Collection Time: 05/26/19  5:44 AM  Result Value Ref Range   Magnesium 2.0 1.7 - 2.4 mg/dL   No results found.   Assessment/Plan: Diagnosis: brain stem infarct, debility 1. Does the need for close, 24 hr/day medical supervision in concert with the patient's rehab needs make it unreasonable for  this patient to be served in a less intensive setting? Yes 2. Co-Morbidities requiring supervision/potential complications:  Wound care, chf 3. Due to bladder management, bowel management, safety, skin/wound care, disease management, medication administration, pain management and patient education, does the patient require 24 hr/day rehab nursing? Yes 4. Does the patient require coordinated care of a physician, rehab nurse, PT (1-2 hrs/day, 5 days/week) and OT (1-2 hrs/day, 5 days/week) to address physical and functional deficits in the context of the above medical diagnosis(es)? Yes Addressing deficits in the following areas: balance, endurance, locomotion, strength, transferring, bowel/bladder control, bathing, dressing, feeding, grooming, toileting and psychosocial support 5. Can the patient actively participate in an intensive therapy program of at least 3 hrs of therapy per day at least 5 days per week? Yes 6. The potential for patient to make measurable gains while on inpatient rehab is excellent 7. Anticipated functional outcomes upon discharge from inpatient rehab are modified independent  with PT, modified independent with OT, n/a with SLP. 8. Estimated rehab length of stay to reach the above functional goals is: 7-10 days 9. Anticipated D/C setting: Home 10. Anticipated post D/C treatments: [place] therapy 11. Overall Rehab/Functional Prognosis: excellent  RECOMMENDATIONS: This patient's condition is appropriate for continued rehabilitative care in the following setting: CIR Patient has agreed to participate in recommended program. Yes Note that insurance prior authorization may be required for reimbursement for recommended care.  Comment: Rehab Admissions Coordinator to follow up.  Thanks,  Meredith Staggers, MD, Mellody Drown  I have personally performed a face to face diagnostic evaluation of this patient. Additionally, I have examined pertinent labs and radiographic images. I have reviewed  and concur with the physician assistant's documentation above.    Meredith Staggers, MD 05/26/2019

## 2019-05-26 NOTE — Progress Notes (Signed)
Physical Therapy Treatment Patient Details Name: [name]. MRN: [id number] DOB: 05-Feb-1941 Today's Date: 05/26/2019    History of Present Illness Patient is a 78 y/o male who presents with BLE swelling and SOB. Found to have cellulitis with ischemic fourth toe on left foot. Admitted with acute heart failure. EKG- Afib with RBBB. Pt s/p left 4th toe amputation 5/30. PMH includes SDH, pulmonary fibrosis, HTN, HLD, CAD, A-fib.    PT Comments    Pt very pleasant and happy to work with therapy. He required min assist bed mobility, min assist sit to stand, and min assist ambulation 100 feet x 2 with RW and +2 for chair follow.  Pt ambulated in L post-op shoe with no c/o pain. He demonstrates decreased safety awareness and increased risk for falls. D/C recommendation updated from SNF to CIR. Pt active and independent with mobility PTA. He lives at home with his wife. She is currently unable to provide needed level of assist. Pt demonstrated good motivation to participate in therapy and goo rehab potential.   Follow Up Recommendations  CIR     Equipment Recommendations  Rolling walker with 5" wheels    Recommendations for Other Services       Precautions / Restrictions Precautions Precautions: Fall Required Braces or Orthoses: Other Brace Other Brace: post op shoe--L Restrictions LLE Weight Bearing: Weight bearing as tolerated    Mobility  Bed Mobility Overal bed mobility: Needs Assistance   Rolling: Modified independent (Device/Increase time)   Supine to sit: Min assist;HOB elevated     General bed mobility comments: heavy reliance on bedrail. Assist to elevate trunk.  Transfers Overall transfer level: Needs assistance Equipment used: Rolling walker (2 wheeled)   Sit to Stand: Min assist         General transfer comment: cues for hand placement, increased time and effort  Ambulation/Gait Ambulation/Gait assistance: Min assist;+2 safety/equipment Gait Distance  (Feet): 100 Feet(x 2) Assistive device: Rolling walker (2 wheeled) Gait Pattern/deviations: Step-through pattern;Decreased stride length Gait velocity: decreased Gait velocity interpretation: <1.31 ft/sec, indicative of household ambulator General Gait Details: decreased foot clearance on L. 5 minute seated rest break between 100-foot gait trials due to fatigue and mild SOB. During seated rest SpO2 98% on RA and HR 113. +2 utilized for chair follow.   Stairs             Wheelchair Mobility    Modified Rankin (Stroke Patients Only)       Balance Overall balance assessment: Needs assistance Sitting-balance support: Feet supported;No upper extremity supported Sitting balance-Leahy Scale: Good     Standing balance support: Bilateral upper extremity supported;During functional activity Standing balance-Leahy Scale: Poor Standing balance comment: reliant on RW                            Cognition Arousal/Alertness: Awake/alert Behavior During Therapy: WFL for tasks assessed/performed Overall Cognitive Status: Impaired/Different from baseline                           Safety/Judgement: Decreased awareness of safety;Decreased awareness of deficits     General Comments: Pt A&O x 4. Very witty and joking during session. Decreased safety      Exercises      General Comments        Pertinent Vitals/Pain Pain Assessment: No/denies pain    Home Living  Prior Function            PT Goals (current goals can now be found in the care plan section) Acute Rehab PT Goals Patient Stated Goal: to go home PT Goal Formulation: With patient Time For Goal Achievement: 05/30/19 Potential to Achieve Goals: Good Progress towards PT goals: Progressing toward goals    Frequency    Min 3X/week      PT Plan Discharge plan needs to be updated    Co-evaluation              AM-PAC PT "6 Clicks" Mobility   Outcome  Measure  Help needed turning from your back to your side while in a flat bed without using bedrails?: A Little Help needed moving from lying on your back to sitting on the side of a flat bed without using bedrails?: A Little Help needed moving to and from a bed to a chair (including a wheelchair)?: A Little Help needed standing up from a chair using your arms (e.g., wheelchair or bedside chair)?: A Little Help needed to walk in hospital room?: A Little Help needed climbing 3-5 steps with a railing? : A Lot 6 Click Score: 17    End of Session Equipment Utilized During Treatment: Gait belt Activity Tolerance: Patient tolerated treatment well Patient left: in chair;with call bell/phone within reach;with chair alarm set Nurse Communication: Mobility status PT Visit Diagnosis: Difficulty in walking, not elsewhere classified (R26.2);Muscle weakness (generalized) (M62.81);Unsteadiness on feet (R26.81);Other abnormalities of gait and mobility (R26.89)     Time: 8343-7357 PT Time Calculation (min) (ACUTE ONLY): 24 min  Charges:  $Gait Training: 23-37 mins                     Lorrin Goodell, [place]  Office # [phone] Pager [phone]    Lorriane Shire 05/26/2019, 10:15 AM

## 2019-05-26 NOTE — TOC Transition Note (Signed)
Transition of Care [facility]) - CM/SW Discharge Note   Patient Details  Name: [name]. MRN: [id number] Date of Birth: October 08, 1941  [facility] CM/SW Contact:  Candie Chroman, LCSW Phone Number: 05/26/2019, 3:36 PM   Clinical Narrative: Patient discharging to CIR today. CSW signing off.  Final next level of care: IP Rehab Facility Barriers to Discharge: No Barriers Identified   Patient Goals and CMS Choice Patient states their goals for this hospitalization and ongoing recovery are:: to be able to retunr home CMS Medicare.gov Compare Post Acute Care list provided to:: Patient Choice offered to / list presented to : Patient  Discharge Placement              Patient chooses bed at: (Inpatient rehab)     Patient and family notified of of transfer: 05/26/19  Discharge Plan and Services In-house Referral: [facility] Discharge Planning Services: CM Consult Post Acute Care Choice: IP Rehab          DME Arranged: N/A DME Agency: NA       HH Arranged: NA HH Agency: NA Date HH Agency Contacted: 05/19/19 Time [place]: 1406 Representative spoke with at [place]: Dan RN  Social Determinants of Health ([place]) Interventions     Readmission Risk Interventions Readmission Risk Prevention Plan 05/26/2019  Transportation Screening Complete  PCP or Specialist Appt within 3-5 Days Complete  HRI or [place] Complete  Social Work Consult for [place] Planning/Counseling Complete  Palliative Care Screening Not Applicable  Medication Review Press photographer) Complete  Some recent data might be hidden

## 2019-05-26 NOTE — Progress Notes (Signed)
Meredith Staggers, MD  Physician  Physical Medicine and Rehabilitation  Consult Note  Signed  Date of Service:  05/26/2019 8:52 AM       Related encounter: ED to Hosp-Admission (Discharged) from 05/15/2019 in [facility]      Signed      Expand All Collapse All    Show:Clear all [x] Manual[x] Template[] Copied  Added by: [x] Bary Leriche, PA-C[x] Meredith Staggers, MD  [] Hover for details      Physical Medicine and Rehabilitation Consult  Reason for Consult: Debility.  Referring Physician: Dr. Florene Glen.    HPI: [name]. is a 78 y.o. male with history of CAD, CAF, T2DM, [name], Pulmonary fibrosis/OSA, h/o SDH, lewy body dementia who was admitted via cardiology office on 05/15/19 with few months history of progressive SOB, peripheral edema and frequent falls . He was treated for acute on chronic CHF and started on antibiotics due to left foot cellulitis.  2  D echo with EF 50-55% septal hypokinesis and moderately enlarged RV. ABI without evidence of significant disease. Dr. 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  Patient with chronic anemia and thrombocytopenia with cirrhosis of liver and H/H platelets being monitored.  He developed dizziness with transient diplopia on 6/3 and CTA negative. MRI not done due to PPM and neurology questioned small brain stem infarct v/s TIA due to small vessel disease. Now on ASA but not on AC due to history of spontaneous SDH as well as gait disorder with falls. Therapy ongoing and patient limited gait disorder, BLE instability and poor safety awareness. Family requesting CIR due to debility.    Review of Systems  Constitutional: Negative for chills and fever.  HENT: Positive for hearing loss.   Respiratory: Negative for cough and shortness of breath.        DOE  Cardiovascular: Negative for chest pain, palpitations and leg swelling.       Orthostatic changes with  standing-resolve in a few minutes.   Musculoskeletal: Negative for myalgias.  Skin: Negative for rash.  Neurological: Negative for dizziness and headaches.  Psychiatric/Behavioral: Positive for memory loss.          Past Medical History:  Diagnosis Date  . (HFpEF) heart failure with preserved ejection fraction ([place])    a. 05/2013 Echo: EF 55%, mild LVH, diast dysfxn, Ao sclerosis, mildly dil LA, RV dysfxn (poorly visualized), PASP 52mmHg;  b. 03/2017 Echo: EF 55-60%, no rwma, triv MR, mildly dil RV, mod TR, PASP 42mmHg.  . Atrial fibrillation [facility])    s/p Cox Maze 1/09;  Multaq Rx d/c'd in 2014 due to pulmo fibrosis;  coumadin d/c'd in 2014 due to spontaneous subdural hematoma  . BPH (benign prostatic hyperplasia)   . CAD (coronary artery disease), native coronary artery    a. s/p CABG 12/2007;  b. Myoview 12/2011: EF 66%, no scar or ischemia; normal.  . DM (diabetes mellitus) ([place])   . Hyperlipidemia type II   . Hypertension   . 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 OSA (obstructive sleep apnea)   . Pacemaker    PPM - St. Jude  . Peripheral neuropathy 07/31/2018  . Pulmonary fibrosis ([place])    Multaq d/c'd 7/14  . Subdural hematoma ([place]) 07/2012   spontaneous;  coumadin d/c'd => no longer a candidate for anticoagulation         Past Surgical History:  Procedure Laterality Date  .  AMPUTATION Left 05/17/2019   Procedure: AMPUTATION LEFT FOURTH TOE;  Surgeon: Meredith Pel, MD;  Location: [place];  Service: Orthopedics;  Laterality: Left;  . APPENDECTOMY    . CHOLECYSTECTOMY    . CORONARY ARTERY BYPASS GRAFT     x3 (left internal mammary artery to distal left anterior descending coronary artery, saphenous vain graft to second circumflex marginal branch, saphenous vain graft to posterior descending coronary artery, endoscopic saphenous vain harvest from right thigh) and modified Cox - Maze IV procedure.  [name]  [name],MD. Electronically signed CHO/MEDQ D: 01/09/2008/ JOB: [id number] cc:  [name]  . [name]  07/30/2012   Procedure: CRANIOTOMY HEMATOMA EVACUATION SUBDURAL;  Surgeon: [name], MD;  Location: [place] [facility];  Service: Neurosurgery;  Laterality: Right;  Right craniotomy for evacuation of subdural hematoma  . FOOT SURGERY    . HERNIA REPAIR    . ORCHIECTOMY     Left  /  testicular cancer  . PACEMAKER PLACEMENT     PPM - St. Jude         Family History  Problem Relation Age of Onset  . Heart disease Father   . Heart attack Father   . Heart failure Father   . Heart disease Mother   . Alzheimer's disease Mother   . Dementia Mother     Social History:  Married. Lives with wife--was independent PTA. Reports was moving lawn with push mover--wrecked his riding mower. He reports that he quit smoking about 28 years ago. He has never used smokeless tobacco. He reports that he does not drink alcohol or use drugs.         Allergies  Allergen Reactions  . Lipitor [Atorvastatin] Other (See Comments)    Stiff joints  . Nsaids Other (See Comments)    unknown  . Warfarin And Related Other (See Comments)    Stiff joints  . Allopurinol Itching    May or may not be allergic to this (If the patient should find out he is NOT allergic, please omit this entry.)  . Enbrel [Etanercept] Itching    May or may not be allergic to this (If the patient should find out he is NOT allergic, please omit this entry.)          Medications Prior to Admission  Medication Sig Dispense Refill  . allopurinol (ZYLOPRIM) 300 MG tablet Take 1 tablet by mouth daily.    . colchicine 0.6 MG tablet Take 0.6-1.2 mg by mouth See admin instructions. Take 1.2 mg by mouth then 0.6 mg one hour later as needed for gout flares/ as directed.  1  . ferrous sulfate 325 (65 FE) MG tablet Take 325 mg by mouth daily with breakfast.    . furosemide (LASIX) 40 MG tablet Take 2  tablets (80 mg total) by mouth 2 (two) times daily. 360 tablet 3  . metoprolol tartrate (LOPRESSOR) 50 MG tablet Take 1.5 tablets (75 mg total) by mouth 2 (two) times daily. 270 tablet 3  . MYRBETRIQ 50 MG TB24 tablet TAKE 1 TABLET BY MOUTH EVERY DAY (Patient taking differently: Take 50 mg by mouth daily. ) 30 tablet 3  . pantoprazole (PROTONIX) 40 MG tablet Take 40 mg by mouth daily.    . potassium chloride SA (K-DUR,KLOR-CON) 20 MEQ tablet Take 20 mEq by mouth daily.      . rosuvastatin (CRESTOR) 40 MG tablet Take 1 tablet (40 mg total) by mouth daily. 90 tablet 2  .  sertraline (ZOLOFT) 100 MG tablet Take 200 mg by mouth daily.     . Tamsulosin HCl (FLOMAX) 0.4 MG CAPS Take 0.4 mg by mouth at bedtime.       Home: Home Living Family/patient expects to be discharged to:: Private residence Living Arrangements: Spouse/significant other Available Help at Discharge: Family, Available 24 hours/day Type of Home: House Home Access: Stairs to enter [employer] of Steps: 3 Entrance Stairs-Rails: Right, Left, Can reach both Home Layout: One level Bathroom Shower/Tub: Gaffer, Door [employer] Toilet: Standard Bathroom Accessibility: Yes Home Equipment: Environmental consultant - 2 wheels, Wheelchair - manual, Bedside commode, Shower seat - built in, [place] - single point  Functional History: Prior Function Level of Independence: Independent with assistive device(s) Comments: Uses SPC for ambulation. Uses RW outside. Lots of falls at home, "hunny, I cannot even count that high."  Drives. Reports working in the yard. Functional Status:  Mobility: Bed Mobility Overal bed mobility: Needs Assistance Bed Mobility: Supine to Sit, Sit to Supine Rolling: Modified independent (Device/Increase time) Sidelying to sit: Min guard, HOB elevated Supine to sit: Min assist, HOB elevated Sit to supine: Min assist General bed mobility comments: heavy reliance on bedrail. Assist to elevate trunk.  Transfers Overall transfer level: Needs assistance Equipment used: Rolling walker (2 wheeled) Transfers: Sit to/from Stand Sit to Stand: Min assist General transfer comment: cues for hand placement, increased time and effort Ambulation/Gait Ambulation/Gait assistance: Min assist, +2 safety/equipment Gait Distance (Feet): 100 Feet(x 2) Assistive device: Rolling walker (2 wheeled) Gait Pattern/deviations: Step-through pattern, Decreased stride length General Gait Details: decreased foot clearance on L. 5 minute seated rest break between 100-foot gait trials due to fatigue and mild SOB. During seated rest SpO2 98% on RA and HR 113. +2 utilized for chair follow. Gait velocity: decreased Gait velocity interpretation: <1.31 ft/sec, indicative of household ambulator  ADL: ADL Overall ADL's : Needs assistance/impaired Eating/Feeding: Independent, Sitting Grooming: Wash/dry hands, Wash/dry face, Sitting, Set up Upper Body Bathing: Minimal assistance, Sitting Lower Body Bathing: Moderate assistance, Sit to/from stand Upper Body Dressing : Minimal assistance, Sitting Lower Body Dressing: Moderate assistance, Sit to/from stand Toilet Transfer: Minimal assistance, Ambulation, RW Toileting- Clothing Manipulation and Hygiene: Minimal assistance, Sit to/from stand Functional mobility during ADLs: Minimal assistance, Rolling walker  Cognition: Cognition Overall Cognitive Status: Impaired/Different from baseline Orientation Level: Oriented X4 Cognition Arousal/Alertness: Awake/alert Behavior During Therapy: WFL for tasks assessed/performed Overall Cognitive Status: Impaired/Different from baseline Area of Impairment: Memory, Following commands, Safety/judgement, Awareness, Problem solving Orientation Level: Time, Situation Memory: Decreased recall of precautions, Decreased short-term memory Following Commands: Follows one step commands inconsistently, Follows one step commands with increased  time Safety/Judgement: Decreased awareness of safety, Decreased awareness of deficits Awareness: Intellectual Problem Solving: Slow processing, Decreased initiation, Difficulty sequencing, Requires verbal cues, Requires tactile cues General Comments: Pt A&O x 4. Very witty and joking during session. Decreased safety   Blood pressure 109/63, pulse 62, temperature 98.6 F (37 C), temperature source Oral, resp. rate 18, height 5' 8.5" (1.74 m), weight 83.9 kg, SpO2 100 %. Physical Exam  Nursing note and vitals reviewed. Constitutional: He is oriented to person, place, and time. He appears well-developed and well-nourished. No distress.  WNWD. Was walking down the hall to room with therapy.   Musculoskeletal:        General: No edema.  Neurological: He is alert and oriented to person, place, and time.  Speech clear. Tangential and has poor awareness of deficits. .   Skin: Skin is warm  and dry. He is not diaphoretic.  BLE with dry flaky skin and dry compressive dressing on left foot.     LabResultsLast24Hours       Results for orders placed or performed during the hospital encounter of 05/15/19 (from the past 24 hour(s))  Comprehensive metabolic panel     Status: Abnormal   Collection Time: 05/26/19  5:44 AM  Result Value Ref Range   Sodium 139 135 - 145 mmol/L   Potassium 4.4 3.5 - 5.1 mmol/L   Chloride 106 98 - 111 mmol/L   CO2 25 22 - 32 mmol/L   Glucose, Bld 99 70 - 99 mg/dL   BUN 26 (H) 8 - 23 mg/dL   Creatinine, Ser 1.26 (H) 0.61 - 1.24 mg/dL   Calcium 9.5 8.9 - 10.3 mg/dL   Total Protein 7.1 6.5 - 8.1 g/dL   Albumin 3.2 (L) 3.5 - 5.0 g/dL   AST 35 15 - 41 U/L   ALT 21 0 - 44 U/L   Alkaline Phosphatase 75 38 - 126 U/L   Total Bilirubin 0.8 0.3 - 1.2 mg/dL   GFR calc non Af Amer 55 (L) >60 mL/min   GFR calc Af Amer >60 >60 mL/min   Anion gap 8 5 - 15  CBC     Status: Abnormal   Collection Time: 05/26/19  5:44 AM  Result Value Ref Range   WBC  5.5 4.0 - 10.5 K/uL   RBC 3.62 (L) 4.22 - 5.81 MIL/uL   Hemoglobin 10.2 (L) 13.0 - 17.0 g/dL   HCT 32.6 (L) 39.0 - 52.0 %   MCV 90.1 80.0 - 100.0 fL   MCH 28.2 26.0 - 34.0 pg   MCHC 31.3 30.0 - 36.0 g/dL   RDW 15.7 (H) 11.5 - 15.5 %   Platelets 87 (L) 150 - 400 K/uL   nRBC 0.0 0.0 - 0.2 %  Magnesium     Status: None   Collection Time: 05/26/19  5:44 AM  Result Value Ref Range   Magnesium 2.0 1.7 - 2.4 mg/dL     ImagingResults(Last48hours)  No results found.     Assessment/Plan: Diagnosis: brain stem infarct, debility 1. Does the need for close, 24 hr/day medical supervision in concert with the patient's rehab needs make it unreasonable for this patient to be served in a less intensive setting? Yes 2. Co-Morbidities requiring supervision/potential complications:  Wound care, chf 3. Due to bladder management, bowel management, safety, skin/wound care, disease management, medication administration, pain management and patient education, does the patient require 24 hr/day rehab nursing? Yes 4. Does the patient require coordinated care of a physician, rehab nurse, PT (1-2 hrs/day, 5 days/week) and OT (1-2 hrs/day, 5 days/week) to address physical and functional deficits in the context of the above medical diagnosis(es)? Yes Addressing deficits in the following areas: balance, endurance, locomotion, strength, transferring, bowel/bladder control, bathing, dressing, feeding, grooming, toileting and psychosocial support 5. Can the patient actively participate in an intensive therapy program of at least 3 hrs of therapy per day at least 5 days per week? Yes 6. The potential for patient to make measurable gains while on inpatient rehab is excellent 7. Anticipated functional outcomes upon discharge from inpatient rehab are modified independent  with PT, modified independent with OT, n/a with SLP. 8. Estimated rehab length of stay to reach the above functional goals is: 7-10 days  9. Anticipated D/C setting: Home 10. Anticipated post D/C treatments: [place] therapy 11. Overall Rehab/Functional Prognosis: excellent  RECOMMENDATIONS:  This patient's condition is appropriate for continued rehabilitative care in the following setting: CIR Patient has agreed to participate in recommended program. Yes Note that insurance prior authorization may be required for reimbursement for recommended care.  Comment: Rehab Admissions Coordinator to follow up.  Thanks,  Meredith Staggers, MD, Mellody Drown  I have personally performed a face to face diagnostic evaluation of this patient. Additionally, I have examined pertinent labs and radiographic images. I have reviewed and concur with the physician assistant's documentation above.    Meredith Staggers, MD 05/26/2019        Revision History                        Routing History

## 2019-05-27 ENCOUNTER — Inpatient Hospital Stay (HOSPITAL_COMMUNITY): Payer: Medicare Other

## 2019-05-27 ENCOUNTER — Inpatient Hospital Stay (HOSPITAL_COMMUNITY): Payer: Medicare Other | Admitting: Occupational Therapy

## 2019-05-27 DIAGNOSIS — Z8739 Personal history of other diseases of the musculoskeletal system and connective tissue: Secondary | ICD-10-CM

## 2019-05-27 DIAGNOSIS — I5032 Chronic diastolic (congestive) heart failure: Secondary | ICD-10-CM

## 2019-05-27 LAB — COMPREHENSIVE METABOLIC PANEL
ALT: 23 U/L (ref 0–44)
AST: 38 U/L (ref 15–41)
Albumin: 3.5 g/dL (ref 3.5–5.0)
Alkaline Phosphatase: 80 U/L (ref 38–126)
Anion gap: 8 (ref 5–15)
BUN: 24 mg/dL — ABNORMAL HIGH (ref 8–23)
CO2: 25 mmol/L (ref 22–32)
Calcium: 9.7 mg/dL (ref 8.9–10.3)
Chloride: 108 mmol/L (ref 98–111)
Creatinine, Ser: 1.22 mg/dL (ref 0.61–1.24)
GFR calc Af Amer: >60 mL/min (ref >60)
GFR calc non Af Amer: 57 mL/min — ABNORMAL LOW (ref >60)
Glucose, Bld: 87 mg/dL (ref 70–99)
Potassium: 4.6 mmol/L (ref 3.5–5.1)
Sodium: 141 mmol/L (ref 135–145)
Total Bilirubin: 0.9 mg/dL (ref 0.3–1.2)
Total Protein: 7.7 g/dL (ref 6.5–8.1)

## 2019-05-27 LAB — CBC WITH DIFFERENTIAL/PLATELET
Abs Immature Granulocytes: 0.02 10*3/uL (ref 0.00–0.07)
Basophils Absolute: 0.1 10*3/uL (ref 0.0–0.1)
Basophils Relative: 1 %
Eosinophils Absolute: 0.2 10*3/uL (ref 0.0–0.5)
Eosinophils Relative: 4 %
HCT: 36.9 % — ABNORMAL LOW (ref 39.0–52.0)
Hemoglobin: 11.7 g/dL — ABNORMAL LOW (ref 13.0–17.0)
Immature Granulocytes: 0 %
Lymphocytes Relative: 17 %
Lymphs Abs: 0.9 10*3/uL (ref 0.7–4.0)
MCH: 28.7 pg (ref 26.0–34.0)
MCHC: 31.7 g/dL (ref 30.0–36.0)
MCV: 90.4 fL (ref 80.0–100.0)
Monocytes Absolute: 0.3 10*3/uL (ref 0.1–1.0)
Monocytes Relative: 5 %
Neutro Abs: 3.8 10*3/uL (ref 1.7–7.7)
Neutrophils Relative %: 73 %
Platelets: 90 10*3/uL — ABNORMAL LOW (ref 150–400)
RBC: 4.08 MIL/uL — ABNORMAL LOW (ref 4.22–5.81)
RDW: 15.7 % — ABNORMAL HIGH (ref 11.5–15.5)
WBC: 5.2 10*3/uL (ref 4.0–10.5)
nRBC: 0 % (ref 0.0–0.2)

## 2019-05-27 LAB — GLUCOSE, CAPILLARY
Glucose-Capillary: 70 mg/dL (ref 70–99)
Glucose-Capillary: 75 mg/dL (ref 70–99)
Glucose-Capillary: 97 mg/dL (ref 70–99)

## 2019-05-27 MED ORDER — HYDROCERIN EX CREA
TOPICAL_CREAM | Freq: Two times a day (BID) | CUTANEOUS | Status: DC
  Administered 2019-05-27 – 2019-06-03 (×15): via TOPICAL
  Administered 2019-06-03: 1 via TOPICAL
  Administered 2019-06-04: 08:00:00 via TOPICAL
  Filled 2019-05-27: qty 113

## 2019-05-27 NOTE — Progress Notes (Signed)
[place] PHYSICAL MEDICINE & REHABILITATION PROGRESS NOTE   Subjective/Complaints:  No issues overnite , PT eval in progress Discussed hx of gout LLE not active, no pain from Left 4th toe amp  ROS- no CP, SOB, N/V?D   Objective:   No results found. Recent Labs    05/25/19 0533 05/26/19 0544  WBC 5.8 5.5  HGB 10.6* 10.2*  HCT 33.1* 32.6*  PLT 84* 87*   Recent Labs    05/26/19 0544 05/27/19 0517  NA 139 141  K 4.4 4.6  CL 106 108  CO2 25 25  GLUCOSE 99 87  BUN 26* 24*  CREATININE 1.26* 1.22  CALCIUM 9.5 9.7    Intake/Output Summary (Last 24 hours) at 05/27/2019 0657 Last data filed at 05/27/2019 0557 Gross per 24 hour  Intake 240 ml  Output 1700 ml  Net -1460 ml     Physical Exam: Vital Signs Blood pressure 123/66, pulse 95, temperature 98.3 F (36.8 C), resp. rate 17, weight 86.4 kg, SpO2 97 %.   General: No acute distress Mood and affect are appropriate Oriented x 3  Heart: Regular rate and rhythm no rubs murmurs or extra sounds Lungs: Clear to auscultation, breathing unlabored, no rales or wheezes Abdomen: Positive bowel sounds, soft nontender to palpation, nondistended Extremities: No clubbing, cyanosis, or edema Skin: No evidence of breakdown, Left foot bulky dressing, + stasis dermatitis Neurologic: Cranial nerves II through XII intact, motor strength is 4/5 in bilateral deltoid, bicep, tricep, grip, hip flexor, knee extensors, ankle dorsiflexor and plantar flexor  Cerebellar exam normalmild intention tremor in BUE otherwise WNL     Musculoskeletal: Full range of motion in all 4 extremities. No joint swelling    Assessment/Plan: 1. 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  Physiatrist is providing close team supervision and 24 hour management of active medical problems listed below.  Physiatrist and rehab team continue to assess  barriers to discharge/monitor patient progress toward functional and medical goals  Care Tool:  Bathing              Bathing assist       Upper Body Dressing/Undressing Upper body dressing        Upper body assist      Lower Body Dressing/Undressing Lower body dressing            Lower body assist       Toileting Toileting    Toileting assist Assist for toileting: Independent with assistive device     Transfers Chair/bed transfer  Transfers assist     Chair/bed transfer assist level: Moderate Assistance - Patient 50 - 74%     Locomotion Ambulation   Ambulation assist              Walk 10 feet activity   Assist           Walk 50 feet activity   Assist           Walk 150 feet activity   Assist           Walk 10 feet on uneven surface  activity   Assist           Wheelchair     Assist               Wheelchair 50 feet with 2 turns activity    Assist  Wheelchair 150 feet activity     Assist          Medical Problem List and Plan: 1.Functional deficitssecondary to debility and brainstem CVA -CIR evals today  2. Antithrombotics: -DVT/anticoagulation:Mechanical:Sequential compression devices, below kneeBilateral lower extremities -antiplatelet therapy: ASA 3. Pain Management:tylenol prn 4. Mood:LCSW to follow for evaluation and support. -antipsychotic agents: N/A 5. Neuropsych: This patient is not fullycapable of making decisions on hisown behalf. 6. Skin/Wound Care:Routine pressure relief measures. 7. Fluids/Electrolytes/Nutrition:MOnitor I/o. C 8.Left great toe amputation:Monitor for healing. 9. CAD s/p PPM/CAF: Monitor HR tid--continue ASA and Crestor. Metoprolol resumed on 6/8--monitor for recurrent bradycardia. 10. Pulmonary fibrosis/OSA: Continue to encourage use of CPAP when sleeping. 11. T2DM-diet controlled:  Hgb A 1c- 5.6. Will monitor BS ac/hs and use SSI for elevated BS.  12. Lewy body dementia?: Hallucinations with STM deficits and falls per notes. Neurology following with work up underway. Avoid neuroleptics  13. [name] with pancytopenia/severe peripheral neuropathy:Monitor platelets 14. H/p BPH: continue on flomax 15. Acute Heart failure: Monitor daily weights. Strict I/O--1200 cc FR.Continue Crestor--lasix resumed 6/8. -Will check orthostatic vitals as reporting dizziness with positional changes. -daily weights   Filed Weights   05/27/19 0552  Weight: 86.4 kg    LOS: 1 days A FACE TO FACE EVALUATION WAS PERFORMED  [name] 05/27/2019, 6:57 AM

## 2019-05-27 NOTE — Evaluation (Signed)
Occupational Therapy Assessment and Plan  Patient Details  Name: [name]. MRN: [id number] Date of Birth: 02-Aug-1941  OT Diagnosis: abnormal posture and muscle weakness (generalized) Rehab Potential: Rehab Potential (ACUTE ONLY): Good ELOS: 7-9 days   Today's Date: 05/27/2019 OT Individual Time: 3888-2800 OT Individual Time Calculation (min): 61 min     Problem List:  Patient Active Problem List   Diagnosis Date Noted  . Debility 05/26/2019  . 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 Toe infection 05/17/2019  . DOE (dyspnea on exertion) 05/15/2019  . Severe tricuspid regurgitation 05/15/2019  . Acute right heart failure ([place]) 05/15/2019  . Cellulitis of fourth toe of left foot   . Excessive daytime sleepiness 02/11/2019  . 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[place]) 02/11/2019  . Iron deficiency anemia 02/04/2019  . Poor memory 12/16/2018  . Deficiency anemia 11/19/2018  . Other fatigue 11/19/2018  . History of elevated PSA 11/19/2018  . Prostate cancer screening 11/19/2018  . Prostate CA ([place]) 11/19/2018  . Pancytopenia, acquired ([place]) 08/13/2018  . Recurrent falls 08/13/2018  . 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 Peripheral neuropathy 07/31/2018  . 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 Anemia, chronic disease 01/28/2018  . History of skin cancer 07/27/2017  . Fatty liver disease, nonalcoholic 34/91/7915  . Thrombocytopenia ([place]) 11/27/2016  . Lung nodule 04/23/2014  . Postinflammatory pulmonary fibrosis ([address]) 06/13/2013  . Long term (current) use of anticoagulants 11/22/2011  . Abdominal bruit 03/07/2011  . Pacemaker-St.Jude   . Hx of CABG   . Hyperlipidemia type II   . OSA (obstructive sleep apnea)   . PACEMAKER, PERMANENT 08/17/2010  . DIABETES MELLITUS 08/16/2010  . HYPERLIPIDEMIA 08/16/2010  . Obesity 08/16/2010  . Essential hypertension 08/16/2010  . Atrial fibrillation ([place])  08/16/2010  . IRREGULAR HEART RATE 08/16/2010  . Sleep apnea 08/16/2010  . BENIGN PROSTATIC HYPERTROPHY, HX OF 08/16/2010    Past Medical History:  Past Medical History:  Diagnosis Date  . (HFpEF) heart failure with preserved ejection fraction ([place])    a. 05/2013 Echo: EF 55%, mild LVH, diast dysfxn, Ao sclerosis, mildly dil LA, RV dysfxn (poorly visualized), PASP 94mHg;  b. 03/2017 Echo: EF 55-60%, no rwma, triv MR, mildly dil RV, mod TR, PASP 499mg.  . Atrial fibrillation ([facility]   s/p Cox Maze 1/09;  Multaq Rx d/c'd in 2014 due to pulmo fibrosis;  coumadin d/c'd in 2014 due to spontaneous subdural hematoma  . BPH (benign prostatic hyperplasia)   . CAD (coronary artery disease), native coronary artery    a. s/p CABG 12/2007;  b. Myoview 12/2011: EF 66%, no scar or ischemia; normal.  . DM (diabetes mellitus) ([facility]  . Hyperlipidemia type II   . Hypertension   . 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 OSA (obstructive sleep apnea)   . Pacemaker    PPM - St. Jude  . Peripheral neuropathy 07/31/2018  . Pulmonary fibrosis ([facility]   Multaq d/c'd 7/14  . Subdural hematoma ([facility]2013   spontaneous;  coumadin d/c'd => no longer a candidate for anticoagulation   Past Surgical History:  Past Surgical History:  Procedure Laterality Date  . AMPUTATION Left 05/17/2019   Procedure: AMPUTATION LEFT FOURTH TOE;  Surgeon: DeMeredith PelMD;  [facility] Service: Orthopedics;  Laterality: Left;  . APPENDECTOMY    . CHOLECYSTECTOMY    . CORONARY ARTERY BYPASS GRAFT  x3 (left internal mammary artery to distal left anterior descending coronary artery, saphenous vain graft to second circumflex marginal branch, saphenous vain graft to posterior descending coronary artery, endoscopic saphenous vain harvest from right thigh) and modified Cox - Maze IV procedure.  [name],MD. Electronically signed CHO/MEDQ D: 01/09/2008/ JOB: [id number] cc:  [name]  . [name]  07/30/2012   Procedure: CRANIOTOMY HEMATOMA EVACUATION SUBDURAL;  Surgeon: [name], MD;  Location: [place] [facility];  Service: Neurosurgery;  Laterality: Right;  Right craniotomy for evacuation of subdural hematoma  . FOOT SURGERY    . HERNIA REPAIR    . ORCHIECTOMY     Left  /  testicular cancer  . PACEMAKER PLACEMENT     PPM - St. Jude    Assessment & Plan Clinical Impression: Patient is a 78 y.o. year old male with recent admission to the hospital on 05/15/2019 with few months history of progressive S OB, cellulitis left foot, peripheral edema and frequent falls.  He was treated for acute on chronic CHF and started on antibiotics due to left foot cellulitis.  2D echo showed EF of 50 to 55% with septal hypokinesis and moderately enlarged RV.  ABIs without evidence of significant disease but Dr. Marlou Sa recommended left fourth toe amputation which was performed on 05/17/2019.  Patient developed transient diplopia with dizziness on 0.6/3 and CTA head was negative.  MRI not done today to PPM.  Neurology question brainstem infarct versus TIA due to small vessel disease.   Patient transferred to CIR on 05/26/2019 .    Patient currently requires mod with basic self-care skills secondary to muscle weakness, decreased memory and decreased standing balance and decreased balance strategies.  Prior to hospitalization, patient could complete ADLs with modified independent .  Patient will benefit from skilled intervention to decrease level of assist with basic self-care skills and increase independence with basic self-care skills prior to discharge home with care partner.  Anticipate patient will require 24 hour supervision and follow up home health.  OT - End of Session Activity Tolerance: Decreased this session Endurance Deficit: Yes OT Assessment Rehab Potential (ACUTE ONLY): Good OT Patient demonstrates impairments in the following area(s): Balance;Endurance;Cognition OT Basic  ADL's Functional Problem(s): Grooming;Bathing;Dressing;Toileting OT Transfers Functional Problem(s): Toilet;Tub/Shower OT Additional Impairment(s): None OT Plan OT Intensity: Minimum of 1-2 x/day, 45 to 90 minutes OT Frequency: 5 out of 7 days OT Duration/Estimated Length of Stay: 7-9 days OT Treatment/Interventions: Balance/vestibular training;Discharge planning;Community reintegration;Functional electrical stimulation;Neuromuscular re-education;Patient/family education;Self Care/advanced ADL retraining;Therapeutic Exercise;UE/LE Coordination activities;DME/adaptive equipment instruction;Functional mobility training;Pain management;Skin care/wound managment;Therapeutic Activities;UE/LE Strength taining/ROM OT Self Feeding Anticipated Outcome(s): independent OT Basic Self-Care Anticipated Outcome(s): supervision OT Toileting Anticipated Outcome(s): supervision OT Bathroom Transfers Anticipated Outcome(s): supervision OT Recommendation Patient destination: Home Follow Up Recommendations: Home health OT Equipment Recommended: To be determined   Skilled Therapeutic Intervention Pt worked on selfcare retraining sit to stand at the sink this session.  Decreased divided attention noted during session with pt reporting that he has trouble with his memory from previous diagnosed dementia.  He is able to complete UB bathing in sitting with min assist for LB bathing.  Decreased efficiency with reaching his feet for bathing and dressing tasks.  Pt reports that he had a sockaide at home prior to this but usually his spouse would just help him.  Min assist for sit to stand and standing while washing upper legs and peri area.  Increased bilateral knee flexion noted in standing with  wide BOS and right lean.  Finished session with pt in the wheelchair at bedside with call button and phone in reach and safety alarm pad in place.   OT Evaluation Precautions/Restrictions  Precautions Precautions: Fall Required  Braces or Orthoses: Other Brace Other Brace: post op shoe--L Restrictions Weight Bearing Restrictions: No LLE Weight Bearing: Weight bearing as tolerated   Pain Pain Assessment Pain Scale: Faces Pain Score: 0-No pain Home Living/Prior Functioning Home Living Family/patient expects to be discharged to:: Private residence Living Arrangements: Spouse/significant other Available Help at Discharge: Family, Available 24 hours/day Type of Home: House Home Access: Stairs to enter [employer] of Steps: 3 Entrance Stairs-Rails: Right, Left, Can reach both Home Layout: One level Bathroom Shower/Tub: Gaffer, Charity fundraiser: Associate Professor Accessibility: Yes  Lives With: Spouse IADL History Homemaking Responsibilities: No Current License: Yes Mode of Transportation: Musician Occupation: Retired Prior Function Level of Independence: Independent with basic ADLs, Requires assistive device for independence  Able to [place]?: Yes Driving: Yes 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 Uses RW outside. Lots of falls at home, "hunny, I cannot even count that high."  Drives. Reports working in the yard. ADL ADL Eating: Independent Where Assessed-Eating: Wheelchair Grooming: Setup Where Assessed-Grooming: Wheelchair Upper Body Bathing: Setup Where Assessed-Upper Body Bathing: Wheelchair Lower Body Bathing: Minimal assistance Where Assessed-Lower Body Bathing: Wheelchair Upper Body Dressing: Minimal assistance Where Assessed-Upper Body Dressing: Wheelchair Lower Body Dressing: Maximal assistance Where Assessed-Lower Body Dressing: Wheelchair Toileting: Minimal assistance Where Assessed-Toileting: Bedside Commode Toilet Transfer: Minimal assistance Armed forces technical officer Method: Counselling psychologist: Bedside commode Vision Baseline Vision/History: Wears glasses Wears Glasses: Reading  only Vision Assessment?: (Vision to be assessed during treatment) Perception  Perception: Within Functional Limits Praxis Praxis: Intact Cognition Overall Cognitive Status: History of cognitive impairments - at baseline Arousal/Alertness: Awake/alert Orientation Level: Person;Place;Situation Person: Oriented Place: Oriented Situation: Oriented Year: 2020 Month: June Day of Week: Correct Memory: Impaired Memory Impairment: Retrieval deficit Immediate Memory Recall: Sock;Blue;Bed Memory Recall: Sock;Blue;Bed Memory Recall Sock: Without Cue Memory Recall Blue: Without Cue Memory Recall Bed: With Cue Attention: Sustained Sustained Attention: Appears intact Awareness: Appears intact Problem Solving: Appears intact Safety/Judgment: Appears intact Sensation Sensation Light Touch: Appears Intact(sensation intact in BUEs) Peripheral sensation comments: hx of peripheral neuropathy Hot/Cold: Appears Intact Proprioception: Appears Intact Stereognosis: Appears Intact Additional Comments: sensation WFL, Decreased sensation in feet secondary to peripheral neuropathy Coordination Gross Motor Movements are Fluid and Coordinated: Yes Fine Motor Movements are Fluid and Coordinated: Yes Coordination and Movement Description: BUE coordination [place] for selfcare tasks Motor  Motor Motor: Within Functional Limits Motor - Skilled Clinical Observations: generalized weakness Mobility  Bed Mobility Bed Mobility: Rolling Right;Rolling Left;Supine to Sit;Sit to Supine Rolling Right: Supervision/verbal cueing Rolling Left: Supervision/Verbal cueing Supine to Sit: Supervision/Verbal cueing Sit to Supine: Supervision/Verbal cueing Transfers Sit to Stand: Minimal Assistance - Patient > 75%  Trunk/Postural Assessment  Cervical Assessment Cervical Assessment: Exceptions to WFL(cervical protraction) Thoracic Assessment Thoracic Assessment: Exceptions to WFL(rounded thoracic region in  sitting) Lumbar Assessment Lumbar Assessment: Exceptions to WFL(posterior pelvic tilt in sitting) Postural Control Postural Control: Within Functional Limits  Balance Balance Balance Assessed: Yes Standardized Balance Assessment Standardized Balance Assessment: Berg Balance Test Static Sitting Balance Static Sitting - Balance Support: Feet supported Static Sitting - Level of Assistance: 5: Stand by assistance Dynamic Sitting Balance Dynamic Sitting - Balance Support: During functional activity Dynamic Sitting - Level of Assistance: 5: Stand by assistance  Static Standing Balance Static Standing - Balance Support: During functional activity Static Standing - Level of Assistance: 4: Min assist Dynamic Standing Balance Dynamic Standing - Balance Support: During functional activity;Bilateral upper extremity supported Dynamic Standing - Level of Assistance: 4: Min assist Extremity/Trunk Assessment RUE Assessment RUE Assessment: Within Functional Limits LUE Assessment LUE Assessment: Within Functional Limits     Refer to Care Plan for Long Term Goals  Recommendations for other services: None    Discharge Criteria: Patient will be discharged from OT if patient refuses treatment 3 consecutive times without medical reason, if treatment goals not met, if there is a change in medical status, if patient makes no progress towards goals or if patient is discharged from hospital.  The above assessment, treatment plan, treatment alternatives and goals were discussed and mutually agreed upon: by patient  Jaymond Waage OTR/L 05/27/2019, 11:52 AM

## 2019-05-27 NOTE — Evaluation (Signed)
Physical Therapy Assessment and Plan  Patient Details  Name: [name]. MRN: [id number] Date of Birth: [birth year]  PT Diagnosis: Abnormal posture, Difficulty walking, Impaired sensation and Muscle weakness Rehab Potential: Good ELOS: 7-9 days   Today's Date: 05/27/2019 PT Individual Time: 0801-0901 and 1331- 1444 PT Individual Time Calculation (min): 60 min  And 73 min   Problem List:  Patient Active Problem List   Diagnosis Date Noted  . Debility 05/26/2019  . 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 Toe infection 05/17/2019  . DOE (dyspnea on exertion) 05/15/2019  . Severe tricuspid regurgitation 05/15/2019  . Acute right heart failure ([place]) 05/15/2019  . Cellulitis of fourth toe of left foot   . Excessive daytime sleepiness 02/11/2019  . 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[place]) 02/11/2019  . Iron deficiency anemia 02/04/2019  . Poor memory 12/16/2018  . Deficiency anemia 11/19/2018  . Other fatigue 11/19/2018  . History of elevated PSA 11/19/2018  . Prostate cancer screening 11/19/2018  . Prostate CA ([place]) 11/19/2018  . Pancytopenia, acquired ([place]) 08/13/2018  . Recurrent falls 08/13/2018  . 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 Peripheral neuropathy 07/31/2018  . 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 Anemia, chronic disease 01/28/2018  . History of skin cancer 07/27/2017  . Fatty liver disease, nonalcoholic 09/09/3006  . Thrombocytopenia ([place]) 11/27/2016  . Lung nodule 04/23/2014  . Postinflammatory pulmonary fibrosis ([place]) 06/13/2013  . Long term (current) use of anticoagulants 11/22/2011  . Abdominal bruit 03/07/2011  . Pacemaker-St.Jude   . Hx of CABG   . Hyperlipidemia type II   . OSA (obstructive sleep apnea)   . PACEMAKER, PERMANENT 08/17/2010  . DIABETES MELLITUS 08/16/2010  . HYPERLIPIDEMIA 08/16/2010  . Obesity 08/16/2010  . Essential hypertension 08/16/2010  . Atrial  fibrillation ([place]) 08/16/2010  . IRREGULAR HEART RATE 08/16/2010  . Sleep apnea 08/16/2010  . BENIGN PROSTATIC HYPERTROPHY, HX OF 08/16/2010    Past Medical History:  Past Medical History:  Diagnosis Date  . (HFpEF) heart failure with preserved ejection fraction ([place])    a. 05/2013 Echo: EF 55%, mild LVH, diast dysfxn, Ao sclerosis, mildly dil LA, RV dysfxn (poorly visualized), PASP 78mHg;  b. 03/2017 Echo: EF 55-60%, no rwma, triv MR, mildly dil RV, mod TR, PASP 437mg.  . [facility]   s/p Cox Maze 1/09;  Multaq Rx d/c'd in 2014 due to pulmo fibrosis;  coumadin d/c'd in 2014 due to spontaneous subdural hematoma  . BPH (benign prostatic hyperplasia)   . CAD (coronary artery disease), native coronary artery    a. s/p CABG 12/2007;  b. Myoview 12/2011: EF 66%, no scar or ischemia; normal.  . DM (diabetes mellitus) ([facility]  . Hyperlipidemia type II   . Hypertension   . 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 OSA (obstructive sleep apnea)   . Pacemaker    PPM - St. Jude  . Peripheral neuropathy 07/31/2018  . Pulmonary fibrosis ([facility]   Multaq d/c'd 7/14  . Subdural hematoma ([place]08/2013   spontaneous;  coumadin d/c'd => no longer a candidate for anticoagulation   Past Surgical History:  Past Surgical History:  Procedure Laterality Date  . AMPUTATION Left 05/17/2019   Procedure: AMPUTATION LEFT FOURTH TOE;  Surgeon: DeMeredith PelMD;  [facility] [facility];  Laterality: Left;  . APPENDECTOMY    . CHOLECYSTECTOMY    . CORONARY ARTERY  BYPASS GRAFT     x3 (left internal mammary artery to distal left anterior descending coronary artery, saphenous vain graft to second circumflex marginal branch, saphenous vain graft to posterior descending coronary artery, endoscopic saphenous vain harvest from right thigh) and modified Cox - Maze IV procedure.  [name],MD. Electronically signed CHO/MEDQ D: 01/09/2008/ JOB: [id number]  cc:  [name]  . [name]  07/30/2012   Procedure: CRANIOTOMY HEMATOMA EVACUATION SUBDURAL;  Surgeon: [name], MD;  Location: [facility];  Service: Neurosurgery;  Laterality: Right;  Right craniotomy for evacuation of subdural hematoma  . FOOT SURGERY    . HERNIA REPAIR    . ORCHIECTOMY     Left  /  testicular cancer  . PACEMAKER PLACEMENT     PPM - St. Jude    Assessment & Plan Clinical Impression: Patient is a 78 y.o. year old male with history of CAD, CAF, T2DM. Pulmonary fibrosis, OS Lewy bodydementia who was admitted via cardiology office on 05/15/2019 with few months history of progressive S OB, cellulitis left foot, peripheral edema and frequent falls. He was treated for acute on chronic CHF and started on antibiotics due to left foot cellulitis. 2D echo showed EF of 50 to 55% with septal hypokinesis and moderately enlarged RV. ABIs without evidence of significant disease but Dr. Marlou Sa recommended left fourth toe amputation which was performed on 05/17/2019. Patient developed transient diplopia with dizziness on 0.6/3 and CTA head was negative. MRI not done today to PPM. Neurology question brainstem infarct versus TIA due to small vessel disease. Now on aspirin and not on AC due to history of spontaneous SDH as well as gait disorder with falls. 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 Platelets with downward trend and being monitored. AKI resolving with serum creatinine down to 1.26. Therapy is ongoing and patient continues to be limited by balance deficits with poor safety awareness affecting overall functional status. CIR recommended for follow-up therapy. Patient transferred to CIR on 05/26/2019 .   Patient currently requires min with mobility secondary to muscle weakness, decreased cardiorespiratoy  endurance and decreased standing balance and decreased balance strategies.  Prior to hospitalization, patient was modified independent  with mobility and lived with Spouse in a House home.  Home access is 3Stairs to enter.  Patient will benefit from skilled PT intervention to maximize safe functional mobility, minimize fall risk and decrease caregiver burden for planned discharge home with 24 hour supervision.  Anticipate patient will benefit from follow up [place] at discharge.  PT - End of Session Activity Tolerance: Tolerates 30+ min activity with multiple rests Endurance Deficit: Yes PT Assessment Rehab Potential (ACUTE/IP ONLY): Good PT Barriers to Discharge: Behavior;Other (comments)(hx of multiple falls) PT Patient demonstrates impairments in the following area(s): Balance;Behavior;Edema;Endurance;Motor;Pain;Nutrition;Perception;Safety;Sensory;Skin Integrity PT Transfers Functional Problem(s): Bed Mobility;Bed to Chair;Car;Furniture;Floor PT Locomotion Functional Problem(s): Ambulation;Wheelchair Mobility;Stairs PT Plan PT Intensity: Minimum of 1-2 x/day ,45 to 90 minutes PT Frequency: 5 out of 7 days PT Duration Estimated Length of Stay: 7-9 days PT Treatment/Interventions: Ambulation/gait training;Balance/vestibular training;Community reintegration;Disease management/prevention;Neuromuscular re-education;Patient/family education;Skin care/wound management;Stair training;Therapeutic Exercise;UE/LE Coordination activities;Wheelchair propulsion/positioning;Visual/perceptual remediation/compensation;UE/LE Strength taining/ROM;Therapeutic Activities;Splinting/orthotics;Psychosocial support;Pain management;Functional mobility training;DME/adaptive equipment instruction;Discharge planning;Cognitive remediation/compensation PT Transfers Anticipated Outcome(s): supervision  PT Locomotion Anticipated Outcome(s): supervision  PT Recommendation Follow Up Recommendations: Home health PT Patient  destination: Home Equipment Recommended: To be  determined  Skilled Therapeutic Intervention Session 1: Evaluation completed (see details above and below) with education on PT POC and goals and individual treatment initiated with focus on functional mobility, transfers, gait and balance. Pt supine in bed upon PT arrival, agreeable to therapy tx and denies pain. Pt transferred to sitting EOB with supervision. Pt seated EOB donned pants and shoes with min assist, performed sit<>stand with RW and min assist to pull pants over hips. Pt ambulated from his room>gym x 150 ft with RW and min assist, occasional cues for RW management and foot placement, x 1 LOB with min assist from therapist to correct. Therapist retrieved w/c this session for better fit and postioning, pt transferred to w/c with RW and min assist. Pt propelled w/c this session x 150 ft with supervision using B UEs working on endurance and UE strength. Pt performed car transfer this session with RW and min assist. Pt ambulated on unlevel surfaces this session x 10 ft to ascend incline and descend decline ramp, min assist with RW. Pt ascended/descended 4 steps this session with B rails and min assist, step to pattern with cues for safety. Pt transported back to room, ambulated 2 x 15 ft from w/c<>toilet, standing balance with CGA while pt managed clothes and stood to use bathroom, continent of bladder. Pt left in w/c at end of session with needs in reach and chair alarm set.   Session 2: Pt seated in w/c upon PT arrival, agreeable to therapy tx and denies pain. Pt ambulated x 150 ft to the gym with RW and min assist. Pt performed sit<>stands from mat this session x 2 with RW while therapist fitted pt for RW height. Pt participated in berg balance test as detailed below, scored 20/56 and therapist discussed these results with the patient. Pt worked on dynamic standing balance without UE support this session while reaching outside BOS and tossing  horseshoes, x 2 trials with min assist. Pt worked on standing balance this session standing on airex x 1 with eyes open, x 1 with eyes closed and x 1 with feet together/eyes open. Pt worked on standing balance while ambulating forwards/backwards with RW 3 x 15 ft in each direction with min assist, cues for wider BOS and foot placement, pt with x 1 LOB when ambulating backwards, mod assist to correct. Pt worked on standing balance without UE support to participate in ball toss activity, during this pt with x 2 LOB able to self correct and x 1 total LOB posteriorly back on to the mat. Pt ambulated back to room x 150 ft with RW and min assist, pt then ambulated  2 x 15 ft from w/c<>toilet, standing balance with CGA while pt managed clothes and stood to use bathroom, continent of bladder. Pt transferred to bed and left supine with needs in reach and chair alarm set.     PT Evaluation Precautions/Restrictions Precautions Precautions: Fall Required Braces or Orthoses: Other Brace Other Brace: post op shoe--L Restrictions Weight Bearing Restrictions: No LLE Weight Bearing: Weight bearing as tolerated General   Vital SignsTherapy Vitals Pulse Rate: 95 Resp: 15 BP: 116/74 Patient Position (if appropriate): Lying Oxygen Therapy SpO2: 100 % O2 Device: Room Air Pain Pain Assessment Pain Scale: 0-10 Pain Score: 0-No pain  Denies pain Home Living/Prior Functioning Home Living Available Help at Discharge: Family;Available 24 hours/day Type of Home: House Home Access: Stairs to enter [employer] of Steps: 3 Entrance Stairs-Rails: Right;Left;Can reach both Home Layout: One level  Lives  With: Spouse Prior Function Level of Independence: Independent with basic ADLs;Requires assistive device for independence(hx of multiple falls, used a cane)  Able to Take Stairs?: Yes Driving: Yes 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  Uses RW outside. Lots of falls at home, "hunny, I cannot even count that high."  Drives. Reports working in the yard. Cognition Overall Cognitive Status: Within Functional Limits for tasks assessed Orientation Level: Oriented X4 Sensation Sensation Light Touch: Impaired Detail Peripheral sensation comments: hx of peripheral neuropathy Proprioception: Appears Intact Additional Comments: sensation WFL, Decreased sensation in feet secondary to peripheral neuropathy Coordination Gross Motor Movements are Fluid and Coordinated: No Fine Motor Movements are Fluid and Coordinated: Yes Coordination and Movement Description: coordination impaired secondary to weakness and L foot ambulation Motor  Motor Motor: Within Functional Limits Motor - Skilled Clinical Observations: generalized weakness  Mobility Bed Mobility Bed Mobility: Rolling Right;Rolling Left;Supine to Sit;Sit to Supine Rolling Right: Supervision/verbal cueing Rolling Left: Supervision/Verbal cueing Supine to Sit: Supervision/Verbal cueing Sit to Supine: Supervision/Verbal cueing Transfers Transfers: Sit to Stand;Stand Pivot Transfers Sit to Stand: Minimal Assistance - Patient > 75% Stand Pivot Transfers: Minimal Assistance - Patient > 75% Stand Pivot Transfer Details: Verbal cues for precautions/safety;Verbal cues for safe use of DME/AE;Verbal cues for technique Transfer (Assistive device): Rolling walker Locomotion  Gait Ambulation: Yes Gait Assistance: Minimal Assistance - Patient > 75% Gait Distance (Feet): 150 Feet Assistive device: Rolling walker Gait Assistance Details: Verbal cues for sequencing;Verbal cues for precautions/safety;Verbal cues for safe use of DME/AE Gait Gait: Yes Gait Pattern: Impaired Gait Pattern: Decreased step length - right;Decreased step length - left;Trunk flexed Gait velocity: decreased Stairs / Additional Locomotion Stairs: Yes Stairs Assistance: Minimal Assistance - Patient >  75% Stair Management Technique: Two rails Number of Stairs: 4 Height of Stairs: 6 Wheelchair Mobility Wheelchair Mobility: Yes Wheelchair Assistance: Chartered loss adjuster: Both upper extremities Wheelchair Parts Management: Needs assistance Distance: 150 ft  Trunk/Postural Assessment  Cervical Assessment Cervical Assessment: Within Functional Limits(forward head posture) Thoracic Assessment Thoracic Assessment: Within Functional Limits(rounded shoulders) Lumbar Assessment Lumbar Assessment: Within Functional Limits Postural Control Postural Control: Within Functional Limits  Balance Balance Balance Assessed: Yes Standardized Balance Assessment Standardized Balance Assessment: Berg Balance Test Berg Balance Test Sit to Stand: Able to stand  independently using hands Standing Unsupported: Needs several tries to stand 30 seconds unsupported Sitting with Back Unsupported but Feet Supported on Floor or Stool: Able to sit safely and securely 2 minutes Stand to Sit: Uses backs of legs against chair to control descent Transfers: Able to transfer safely, definite need of hands Standing Unsupported with Eyes Closed: Able to stand 10 seconds with supervision Standing Ubsupported with Feet Together: Needs help to attain position but able to stand for 30 seconds with feet together From Standing, Reach Forward with Outstretched Arm: Reaches forward but needs supervision From Standing Position, Pick up Object from Floor: Unable to try/needs assist to keep balance From Standing Position, Turn to Look Behind Over each Shoulder: Turn sideways only but maintains balance Turn 360 Degrees: Needs assistance while turning Standing Unsupported, Alternately Place Feet on Step/Stool: Needs assistance to keep from falling or unable to try Standing Unsupported, One Foot in Front: Loses balance while stepping or standing Standing on One Leg: Unable to try or needs assist to  prevent fall Total Score: 20 Static Sitting Balance Static Sitting - Balance Support: Feet supported Static Sitting - Level of Assistance: 5: Stand by  assistance Dynamic Sitting Balance Dynamic Sitting - Balance Support: During functional activity Dynamic Sitting - Level of Assistance: 5: Stand by assistance Static Standing Balance Static Standing - Balance Support: During functional activity Static Standing - Level of Assistance: 4: Min assist Dynamic Standing Balance Dynamic Standing - Balance Support: During functional activity;Bilateral upper extremity supported Dynamic Standing - Level of Assistance: 4: Min assist Extremity Assessment  RLE Assessment RLE Assessment: Within Functional Limits Passive Range of Motion (PROM) Comments: tight hamstrings, heelcords General Strength Comments: grossly 4/5 throughout LLE Assessment LLE Assessment: Within Functional Limits Passive Range of Motion (PROM) Comments: tight hamstrings, heelcords General Strength Comments: grossly 4/5 throughout    Refer to Care Plan for Long Term Goals  Recommendations for other services: None   Discharge Criteria: Patient will be discharged from PT if patient refuses treatment 3 consecutive times without medical reason, if treatment goals not met, if there is a change in medical status, if patient makes no progress towards goals or if patient is discharged from hospital.  The above assessment, treatment plan, treatment alternatives and goals were discussed and mutually agreed upon: by patient  Netta Corrigan, PT, DPT 05/27/2019, 10:15 AM

## 2019-05-27 NOTE — Progress Notes (Signed)
Patient information reviewed and entered into [facility] System by Becky Dava Rensch, PPS coordinator. Information including medical coding, function ability, and quality indicators will be reviewed and updated through discharge.   

## 2019-05-28 ENCOUNTER — Inpatient Hospital Stay (HOSPITAL_COMMUNITY): Payer: Medicare Other

## 2019-05-28 ENCOUNTER — Inpatient Hospital Stay (HOSPITAL_COMMUNITY): Payer: Medicare Other | Admitting: Occupational Therapy

## 2019-05-28 ENCOUNTER — Encounter: Payer: Medicare Other | Admitting: Nurse Practitioner

## 2019-05-28 ENCOUNTER — Ambulatory Visit: Payer: Medicare Other | Admitting: Family Medicine

## 2019-05-28 LAB — GLUCOSE, CAPILLARY
Glucose-Capillary: 67 mg/dL — ABNORMAL LOW (ref 70–99)
Glucose-Capillary: 96 mg/dL (ref 70–99)

## 2019-05-28 MED ORDER — ACETAMINOPHEN 325 MG PO TABS: 325.0000 mg | ORAL_TABLET | ORAL | Status: DC | PRN

## 2019-05-28 MED ORDER — ASPIRIN 81 MG PO TBEC: 81.0000 mg | DELAYED_RELEASE_TABLET | Freq: Every day | ORAL | 0 refills | Status: DC

## 2019-05-28 NOTE — IPOC Note (Signed)
Overall Plan of Care [facility]) Patient Details Name: Jiovanny Burdell. MRN: [id number] DOB: [birth year]  Admitting Diagnosis: <principal problem not specified>  Hospital Problems: Active Problems:   Debility     Functional Problem List: Nursing Bladder, Endurance, Edema, Medication Management  PT Balance, Behavior, Edema, Endurance, Motor, Pain, Nutrition, Perception, Safety, Sensory, Skin Integrity  OT Balance, Endurance, Cognition  SLP    TR         Basic ADL's: OT Grooming, Bathing, Dressing, Toileting     Advanced  ADL's: OT       Transfers: PT Bed Mobility, Bed to Chair, Car, Furniture, Floor  OT Toilet, Metallurgist: PT Ambulation, Emergency planning/management officer, Stairs     Additional Impairments: OT None  SLP        TR      Anticipated Outcomes Item Anticipated Outcome  Self Feeding independent  Swallowing      Basic self-care  supervision  Toileting  supervision   Bathroom Transfers supervision  Bowel/Bladder  pt will be continent x 2, LBM 05/25/19  Transfers  supervision   Locomotion  supervision   Communication     Cognition     Pain  pt will be less than 3 with prn as needed  Safety/Judgment  pt will remain fall free while in rehab    Therapy Plan: PT Intensity: Minimum of 1-2 x/day ,45 to 90 minutes PT Frequency: 5 out of 7 days PT Duration Estimated Length of Stay: 7-9 days OT Intensity: Minimum of 1-2 x/day, 45 to 90 minutes OT Frequency: 5 out of 7 days OT Duration/Estimated Length of Stay: 7-9 days     Due to the current state of emergency, patients may not be receiving their 3-hours of Medicare-mandated therapy.   Team Interventions: Nursing Interventions Patient/Family Education, Bladder Management, Medication Management, Disease Management/Prevention, Discharge Planning  PT interventions Ambulation/gait training, Training and development officer, Community reintegration, Disease management/prevention, Neuromuscular  re-education, Patient/family education, Skin care/wound management, Stair training, Therapeutic Exercise, UE/LE Coordination activities, Wheelchair propulsion/positioning, Visual/perceptual remediation/compensation, UE/LE Strength taining/ROM, Therapeutic Activities, Splinting/orthotics, Psychosocial support, Pain management, Functional mobility training, DME/adaptive equipment instruction, Discharge planning, Cognitive remediation/compensation  OT Interventions Balance/vestibular training, Discharge planning, Community reintegration, Functional electrical stimulation, Neuromuscular re-education, Patient/family education, Self Care/advanced ADL retraining, Therapeutic Exercise, UE/LE Coordination activities, DME/adaptive equipment instruction, Functional mobility training, Pain management, Skin care/wound managment, Therapeutic Activities, UE/LE Strength taining/ROM  SLP Interventions    TR Interventions    SW/CM Interventions Discharge Planning, Psychosocial Support, Patient/Family Education   Barriers to Discharge MD  Medical stability and Medication compliance  Nursing Medication compliance, Weight, Incontinence    PT Behavior, Other (comments)(hx of multiple falls)    OT      SLP      SW       Team Discharge Planning: Destination: PT-Home ,OT- Home , SLP-  Projected Follow-up: PT-Home health PT, OT-  Home health OT, SLP-  Projected Equipment Needs: PT-To be determined, OT- To be determined, SLP-  Equipment Details: PT- , OT-  Patient/family involved in discharge planning: PT- Patient,  OT-Patient, SLP-   MD ELOS: 7-10d Medical Rehab Prognosis:  Excellent Assessment:  78 year old male with history of CAD, CAF, T2DM. Pulmonary fibrosis, OS Lewy bodydementia who was admitted via cardiology office on 05/15/2019 with few months history of progressive S OB, cellulitis left foot, peripheral edema and frequent falls. He was treated for acute on chronic CHF and started on antibiotics due to  left foot cellulitis. 2D  echo showed EF of 50 to 55% with septal hypokinesis and moderately enlarged RV. ABIs without evidence of significant disease but Dr. Marlou Sa recommended left fourth toe amputation which was performed on 05/17/2019. Patient developed transient diplopia with dizziness on 0.6/3 and CTA head was negative. MRI not done today to PPM. Neurology question brainstem infarct versus TIA due to small vessel disease. Now on aspirin and not on AC due to history of spontaneous SDH as well as gait disorder with falls.   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 Platelets with downward trend and being monitored. AKI resolving with serum creatinine down to 1.26.   Now requiring 24/7 Rehab RN,MD, as well as CIR level PT, OT and SLP.  Treatment team will focus on ADLs and mobility with goals set at sup See Team Conference Notes for weekly updates to the plan of care

## 2019-05-28 NOTE — Progress Notes (Addendum)
[place] PHYSICAL MEDICINE & REHABILITATION PROGRESS NOTE   Subjective/Complaints:  Cont of bowel,  occ bladder incont  ROS- no CP, SOB, N/V?D   Objective:   No results found. Recent Labs    05/26/19 0544 05/27/19 0517  WBC 5.5 5.2  HGB 10.2* 11.7*  HCT 32.6* 36.9*  PLT 87* 90*   Recent Labs    05/26/19 0544 05/27/19 0517  NA 139 141  K 4.4 4.6  CL 106 108  CO2 25 25  GLUCOSE 99 87  BUN 26* 24*  CREATININE 1.26* 1.22  CALCIUM 9.5 9.7    Intake/Output Summary (Last 24 hours) at 05/28/2019 1111 Last data filed at 05/28/2019 0735 Gross per 24 hour  Intake 600 ml  Output 1100 ml  Net -500 ml     Physical Exam: Vital Signs Blood pressure 103/70, pulse 81, temperature 98.4 F (36.9 C), temperature source Oral, resp. rate 15, weight 84.2 kg, SpO2 100 %.   General: No acute distress Mood and affect are appropriate Oriented x 3  Heart: Regular rate and rhythm no rubs murmurs or extra sounds Lungs: Clear to auscultation, breathing unlabored, no rales or wheezes Abdomen: Positive bowel sounds, soft nontender to palpation, nondistended Extremities: No clubbing, cyanosis, or edema Skin: No evidence of breakdown, Left foot bulky dressing, + stasis dermatitis Neurologic: Cranial nerves II through XII intact, motor strength is 4/5 in bilateral deltoid, bicep, tricep, grip, hip flexor, knee extensors, ankle dorsiflexor and plantar flexor  Cerebellar exam normalmild intention tremor in BUE otherwise WNL     Musculoskeletal: Full range of motion in all 4 extremities. No joint swelling    Assessment/Plan: 1. 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  Physiatrist is providing close team supervision and 24 hour management of active medical problems listed below.  Physiatrist and rehab team continue to assess barriers to discharge/monitor patient progress  toward functional and medical goals  Care Tool:  Bathing    Body parts bathed by patient: Right arm, Left arm, Chest, Abdomen, Front perineal area, Buttocks, Left upper leg, Right upper leg, Face         Bathing assist Assist Level: Minimal Assistance - Patient > 75%     Upper Body Dressing/Undressing Upper body dressing   What is the patient wearing?: Pull over shirt    Upper body assist Assist Level: Supervision/Verbal cueing    Lower Body Dressing/Undressing Lower body dressing      What is the patient wearing?: Incontinence brief, Pants     Lower body assist Assist for lower body dressing: Minimal Assistance - Patient > 75%     Toileting Toileting    Toileting assist Assist for toileting: Contact Guard/Touching assist     Transfers Chair/bed transfer  Transfers assist     Chair/bed transfer assist level: Contact Guard/Touching assist     Locomotion Ambulation   Ambulation assist      Assist level: Minimal Assistance - Patient > 75% Assistive device: Walker-rolling Max distance: 150 ft   Walk 10 feet activity   Assist     Assist level: Minimal Assistance - Patient > 75% Assistive device: Walker-rolling   Walk 50 feet activity   Assist    Assist level: Minimal Assistance - Patient > 75% Assistive device: Walker-rolling    Walk 150 feet activity   Assist    Assist level: Minimal Assistance - Patient > 75% Assistive device: Walker-rolling  Walk 10 feet on uneven surface  activity   Assist     Assist level: Minimal Assistance - Patient > 75%     Wheelchair     Assist Will patient use wheelchair at discharge?: Yes Type of Wheelchair: Manual    Wheelchair assist level: Supervision/Verbal cueing Max wheelchair distance: 150 ft    Wheelchair 50 feet with 2 turns activity    Assist        Assist Level: Supervision/Verbal cueing   Wheelchair 150 feet activity     Assist     Assist Level:  Supervision/Verbal cueing    Medical Problem List and Plan: 1.Functional deficitssecondary to debility and brainstem CVA Team conference today please see physician documentation under team conference tab, met with team face-to-face to discuss problems,progress, and goals. Formulized individual treatment plan based on medical history, underlying problem and comorbidities. 2. Antithrombotics: -DVT/anticoagulation:Mechanical:Sequential compression devices, below kneeBilateral lower extremities -antiplatelet therapy: ASA 3. Pain Management:tylenol prn 4. Mood:LCSW to follow for evaluation and support. -antipsychotic agents: N/A 5. Neuropsych: This patient is not fullycapable of making decisions on hisown behalf. 6. Skin/Wound Care:Routine pressure relief measures. 7. Fluids/Electrolytes/Nutrition:MOnitor I/o. C 8.Left great toe amputation:Monitor for healing. 9. CAD s/p PPM/CAF: Monitor HR tid--continue ASA and Crestor. Metoprolol resumed on 6/8--monitor for recurrent bradycardia. 10. Pulmonary fibrosis/OSA: Continue to encourage use of CPAP when sleeping. 11. T2DM-diet controlled: Hgb A 1c- 5.6. Will monitor BS ac/hs and use SSI for elevated BS.  No CBG needed 12. Lewy body dementia?: Hallucinations with STM deficits and falls per notes. Neurology following with work up underway. Avoid neuroleptics  13. [name] with pancytopenia/severe peripheral neuropathy:Monitor platelets 14. H/p BPH: continue on flomax 15. Acute on chronic systolic and diastolic heart failure: Monitor daily weights. Strict I/O--1200 cc FR.Continue Crestor--lasix resumed 6/8. -Will check orthostatic vitals as reporting dizziness with positional changes. -daily weights   Filed Weights   05/27/19 0552 05/28/19 0522  Weight: 86.4 kg 84.2 kg    LOS: 2 days A FACE TO FACE EVALUATION WAS PERFORMED  Luanna Salk [name] 05/28/2019, 11:11  AM

## 2019-05-28 NOTE — Progress Notes (Signed)
Physical Therapy Session Note  Patient Details  Name: [name]. MRN: [id number] Date of Birth: 11/11/1941  Today's Date: 05/28/2019 PT Individual Time: 4656-8127 and 1545- 1658 PT Individual Time Calculation (min): 45 min and 73 min  Short Term Goals: Week 1:  PT Short Term Goal 1 (Week 1): STG=LTG due to ELOS  Skilled Therapeutic Interventions/Progress Updates:    Session 1: Pt seated in w/c upon PT arrival, agreeable to therapy tx and denies pain. Pt seated in w/c doffed R sock and donned R shoe with supervision. Pt ambulated to the gym this session with RW and min assist x 150 ft, cues for awareness and wider BOS to limit LOB. Pt worked on dynamic standing balance this session with cues for safety and techniques, CGA-min assist to perform the following: weaving through cones with RW, standing on red wedge without UE support while tossing horseshoes x 2 trials, standing on red wedge without UE support while therapist applies manual perturbations, forwards/backwards ambulation 2 x 10 ft with RW, lateral stepping in place without UE support, toe taps on aerobic step x 2 trials. Pt ambulated back to room and left in w/c with needs in reach and chair alarm set.   Session 2: Pt supine in bed asleep upon PT arrival, agreeable to therapy tx and denies pain. Pt transferred to sitting EOB with supervision and ambulated into the bathroom with RW 10 ft and min assist, standing to use the bathroom, continent of bladder and performs clothing management with supervision. Pt ambulated to the dayroom x 120 ft with RW and min assist, transferred to nustep and used x 8 minutes on workload 5 for global strengthening and endurance. Pt ambulated to the gym x 150 ft with RW and min assist, transferred to mat. Pt transferred to supine with supervision and performed LE therex for strengthening this session to include the following, 2 x 10 of each: straight leg raises, hip flexion, bridges, sidelying hip abduction  and prone hip extension, therapist providing cues throughout for techniques. While in prone pt also performed hip flexor stretch by pushing up into prone on elbows x 2 minutes. Pt transferred to sitting with supervision. Pt worked on standing balance this session to perform ball toss activity without UE support, standing on airex without UE support while tossing horseshoes, standing on airex with eyes closed 2 x 30 sec, completed peg board puzzle while standing on red wedge, all activities with CGA-min assist. Pt ambulated back to room at end of session, transferred to bed and left supine with needs in reach and chair alarm set.     Therapy Documentation Precautions:  Precautions Precautions: Fall Required Braces or Orthoses: Other Brace Other Brace: post op shoe--L Restrictions Weight Bearing Restrictions: No LLE Weight Bearing: Weight bearing as tolerated    Therapy/Group: Individual Therapy  Netta Corrigan, PT, DPT 05/28/2019, 7:53 AM

## 2019-05-28 NOTE — Progress Notes (Signed)
Patient places himself on home CPAP and stated he needs no assistance this evening, only added water to machine.

## 2019-05-28 NOTE — Progress Notes (Signed)
Occupational Therapy Session Note  Patient Details  Name: [name]. MRN: [id number] Date of Birth: [birth year]  Today's Date: 05/28/2019 OT Individual Time: 8270-7867 OT Individual Time Calculation (min): 75 min    Short Term Goals: Week 1:  OT Short Term Goal 1 (Week 1): Short term goals equal to LTGs set at supervision level.  Skilled Therapeutic Interventions/Progress Updates:    Pt completed shower and dressing during session.  Min assist for transfers into the bathroom and out to the wheelchair with use of the RW for support.  Pt needed mod instructional cueing during shower to remain seated for safety and demonstrated the need for consistent min assist for standing balance when completing LB bathing.  He was able to complete dressing sit to stand at the sink.  Supervision was needed for donning pullover with min assist for donning brief, shorts, gripper sock on the right foot and slip on shoe.  He was able to donn his right post op shoe with setup assist only.  Finished session with pt up in the wheelchair at the sink working on grooming task of brushing his hair with setup assist.  Pt is able to propel the wheelchair without assistance and has chair alarm pad in place.    Therapy Documentation Precautions:  Precautions Precautions: Fall Required Braces or Orthoses: Other Brace Other Brace: post op shoe--L Restrictions Weight Bearing Restrictions: No LLE Weight Bearing: Weight bearing as tolerated  Pain: Pain Assessment Pain Score: 0-No pain ADL: See Care Tool Section for some details of ADL  Therapy/Group: Individual Therapy  Michiko Lineman OTR/L 05/28/2019, 2:59 PM

## 2019-05-28 NOTE — Significant Event (Signed)
Hypoglycemic Event  CBG: 67  Treatment: 4 oz juice/soda  Symptoms: None  Follow-up CBG: [id number] CBG Result:96  Possible Reasons for Event: Unknown  Comments/MD notified:no    Larina Earthly

## 2019-05-29 ENCOUNTER — Inpatient Hospital Stay (HOSPITAL_COMMUNITY): Payer: Medicare Other | Admitting: Physical Therapy

## 2019-05-29 ENCOUNTER — Inpatient Hospital Stay (HOSPITAL_COMMUNITY): Payer: Medicare Other | Admitting: Occupational Therapy

## 2019-05-29 LAB — BASIC METABOLIC PANEL
Anion gap: 8 (ref 5–15)
BUN: 32 mg/dL — ABNORMAL HIGH (ref 8–23)
CO2: 28 mmol/L (ref 22–32)
Calcium: 9.4 mg/dL (ref 8.9–10.3)
Chloride: 104 mmol/L (ref 98–111)
Creatinine, Ser: 1.38 mg/dL — ABNORMAL HIGH (ref 0.61–1.24)
GFR calc Af Amer: 57 mL/min — ABNORMAL LOW (ref >60)
GFR calc non Af Amer: 49 mL/min — ABNORMAL LOW (ref >60)
Glucose, Bld: 91 mg/dL (ref 70–99)
Potassium: 4.6 mmol/L (ref 3.5–5.1)
Sodium: 140 mmol/L (ref 135–145)

## 2019-05-29 LAB — CBC
HCT: 35.7 % — ABNORMAL LOW (ref 39.0–52.0)
Hemoglobin: 11.2 g/dL — ABNORMAL LOW (ref 13.0–17.0)
MCH: 27.9 pg (ref 26.0–34.0)
MCHC: 31.4 g/dL (ref 30.0–36.0)
MCV: 88.8 fL (ref 80.0–100.0)
Platelets: 124 10*3/uL — ABNORMAL LOW (ref 150–400)
RBC: 4.02 MIL/uL — ABNORMAL LOW (ref 4.22–5.81)
RDW: 15.2 % (ref 11.5–15.5)
WBC: 6.4 10*3/uL (ref 4.0–10.5)
nRBC: 0 % (ref 0.0–0.2)

## 2019-05-29 MED ORDER — FUROSEMIDE 40 MG PO TABS
40.0000 mg | ORAL_TABLET | Freq: Two times a day (BID) | ORAL | Status: DC
  Administered 2019-05-29 – 2019-06-02 (×8): 40 mg via ORAL
  Filled 2019-05-29 (×8): qty 1

## 2019-05-29 MED ORDER — METOPROLOL TARTRATE 50 MG PO TABS
50.0000 mg | ORAL_TABLET | Freq: Two times a day (BID) | ORAL | Status: DC
  Administered 2019-05-29 – 2019-06-04 (×8): 50 mg via ORAL
  Filled 2019-05-29 (×12): qty 1

## 2019-05-29 NOTE — Progress Notes (Signed)
Left fourth toe dressing removed.  Incision seems to be healing in general reasonably well.  Plan to remove sutures next week.  No evidence of recurrent infection yet.

## 2019-05-29 NOTE — Progress Notes (Addendum)
Occupational Therapy Session Note  Patient Details  Name: [name]. MRN: [id number] Date of Birth: 05-19-1941  Today's Date: 05/29/2019 OT Individual Time: 0800-0900 OT Individual Time Calculation (min): 60 min    Short Term Goals: Week 1:  OT Short Term Goal 1 (Week 1): Short term goals equal to LTGs set at supervision level.  Skilled Therapeutic Interventions/Progress Updates:    Session 1:  (0800-0900)  Pt with BP in supine at 85/57 and in sitting at 79/70 and 86/64.  No symptoms of dizziness with sitting or with standing (BP 86/67)  Pt's nursee aware as well as MD and medications with be adjusted.  Throughout session pt with no report of light headedness or dizziness.  Pt completed LB dressing from EOB to start session.  He was able to donn shorts, gripper sock and shoe on the right foot as well as fracture shoe on the left with min assist sit to stand.  Min guard for sit to stand from the EOB with min assist for initial transfer to the wheelchair.  He agreed to work on grooming task of shaving at the sink for rest of session after dressing.  He did doff his shirt with min guard in standing as well as donning it at the same level when he finished.  Had pt stand for 2 intervals of at least 10 mins with min guard assist while applying shaving cream, washing his face, and completing shaving.  Finished session with pt in the wheelchair working on combing his hair with setup only.  He completed oral hygiene in sitting as well from the wheelchair prior to shaving task.  Call button and phone in reach on the bed with chair alarm in place.    Session 2: (1004-1100) Pt transferred from supine to sit EOB and then ambulated with min guard assist down to the dayroom for session.  Once in the dayroom had him work on dynamic standing balance from the EOM.  He worked on static standing standing at supervision level head turns, progressing to standing with feet closer together.  LOB to the right when  standing with feet together.   He demonstrated LOB as well with cervical extension when standing with feet shoulder width apart.  Next, had pt work on functional reaching in standing to both left and right as well as to the floor, with min assist overall.  He completed toe taps to 2" block alternating feet, with greater ability to smoothly transition lifting the LLE up on the block compared to the right.  Finished session with functional mobility 50 ft X2 without assistive device and consistent min assist with LOB to the left.  Returned to room at end of session with pt choosing to stay up on the wheelchair.  Call button and phone in reach with safety chair alarm in place.    Therapy Documentation Precautions:  Precautions Precautions: Fall Required Braces or Orthoses: Other Brace Other Brace: fracture shoe on the left foot Restrictions Weight Bearing Restrictions: No LLE Weight Bearing: Weight bearing as tolerated  Vital Signs: Therapy Vitals Pulse Rate: 78 BP: (!) 85/57 Patient Position (if appropriate): Lying Pain: Pain Assessment Pain Score: 0-No pain ADL: See Care Tool Section for some details of ADL  Therapy/Group: Individual Therapy  [name] OTR/L 05/29/2019, 11:12 AM

## 2019-05-29 NOTE — Progress Notes (Signed)
[place] PHYSICAL MEDICINE & REHABILITATION PROGRESS NOTE   Subjective/Complaints: Patient without new issues.  Personally reviewed labs   ROS- no CP, SOB, N/V?D   Objective:   No results found. Recent Labs    05/27/19 0517 05/29/19 0552  WBC 5.2 6.4  HGB 11.7* 11.2*  HCT 36.9* 35.7*  PLT 90* 124*   Recent Labs    05/27/19 0517 05/29/19 0552  NA 141 140  K 4.6 4.6  CL 108 104  CO2 25 28  GLUCOSE 87 91  BUN 24* 32*  CREATININE 1.22 1.38*  CALCIUM 9.7 9.4    Intake/Output Summary (Last 24 hours) at 05/29/2019 1601 Last data filed at 05/29/2019 0723 Gross per 24 hour  Intake 240 ml  Output 1050 ml  Net -810 ml     Physical Exam: Vital Signs Blood pressure 101/68, pulse 87, temperature 98.1 F (36.7 C), temperature source Oral, resp. rate 18, weight 84.3 kg, SpO2 100 %.   General: No acute distress Mood and affect are appropriate Oriented x 3  Heart: Regular rate and rhythm no rubs murmurs or extra sounds Lungs: Clear to auscultation, breathing unlabored, no rales or wheezes Abdomen: Positive bowel sounds, soft nontender to palpation, nondistended Extremities: No clubbing, cyanosis, or edema Skin: No evidence of breakdown, Left foot bulky dressing, + stasis dermatitis Neurologic: Cranial nerves II through XII intact, motor strength is 4/5 in bilateral deltoid, bicep, tricep, grip, hip flexor, knee extensors, ankle dorsiflexor and plantar flexor  Cerebellar exam normalmild intention tremor in BUE otherwise WNL     Musculoskeletal: Full range of motion in all 4 extremities. No joint swelling    Assessment/Plan: 1. 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  Physiatrist is providing close team supervision and 24 hour management of active medical problems listed below.  Physiatrist and rehab team continue to assess barriers to discharge/monitor  patient progress toward functional and medical goals  Care Tool:  Bathing    Body parts bathed by patient: Right arm, Left arm, Chest, Abdomen, Front perineal area, Buttocks, Left upper leg, Right upper leg, Face, Right lower leg, Left lower leg         Bathing assist Assist Level: Minimal Assistance - Patient > 75%     Upper Body Dressing/Undressing Upper body dressing   What is the patient wearing?: Pull over shirt    Upper body assist Assist Level: Supervision/Verbal cueing    Lower Body Dressing/Undressing Lower body dressing      What is the patient wearing?: Pants     Lower body assist Assist for lower body dressing: Minimal Assistance - Patient > 75%     Toileting Toileting    Toileting assist Assist for toileting: Contact Guard/Touching assist     Transfers Chair/bed transfer  Transfers assist     Chair/bed transfer assist level: Contact Guard/Touching assist     Locomotion Ambulation   Ambulation assist      Assist level: Contact Guard/Touching assist Assistive device: Walker-rolling Max distance: 173ft   Walk 10 feet activity   Assist     Assist level: Contact Guard/Touching assist Assistive device: Walker-rolling   Walk 50 feet activity   Assist    Assist level: Contact Guard/Touching assist Assistive device: Walker-rolling    Walk 150 feet activity   Assist    Assist level: Contact Guard/Touching assist Assistive device: Walker-rolling    Walk 10 feet on uneven surface  activity   Assist     Assist level: Minimal Assistance - Patient > 75%     Wheelchair     Assist Will patient use wheelchair at discharge?: No(TBD but do not anticipate will need) Type of Wheelchair: Manual    Wheelchair assist level: Supervision/Verbal cueing Max wheelchair distance: 150 ft    Wheelchair 50 feet with 2 turns activity    Assist        Assist Level: Supervision/Verbal cueing   Wheelchair 150 feet activity      Assist     Assist Level: Supervision/Verbal cueing    Medical Problem List and Plan: 1.Functional deficitssecondary to debility and brainstem CVA Continue CIR PT OT 2. Antithrombotics: -DVT/anticoagulation:Mechanical:Sequential compression devices, below kneeBilateral lower extremities -antiplatelet therapy: ASA 3. Pain Management:tylenol prn 4. Mood:LCSW to follow for evaluation and support. -antipsychotic agents: N/A 5. Neuropsych: This patient is not fullycapable of making decisions on hisown behalf. 6. Skin/Wound Care:Routine pressure relief measures. 7. Fluids/Electrolytes/Nutrition:MOnitor I/o.  Weights going down, BUN creatinine rising, will reduce Lasix to 40 mg twice daily 8.Left great toe amputation:Monitor for healing. 9. CAD s/p PPM/CAF: Monitor HR tid--continue ASA and Crestor. Metoprolol resumed on 6/8--monitor for recurrent bradycardia. 10. Pulmonary fibrosis/OSA: Continue to encourage use of CPAP when sleeping. 11. T2DM-diet controlled: Hgb A 1c- 5.6. Will monitor BS ac/hs and use SSI for elevated BS.  No CBG needed 12. Lewy body dementia?: Hallucinations with STM deficits and falls per notes. Neurology following with work up underway. Avoid neuroleptics  13. [name] with pancytopenia/severe peripheral neuropathy:Monitor platelets 14. H/p BPH: continue on flomax 15. Acute on chronic systolic and diastolic heart failure: Monitor daily weights. Strict I/O--1200 cc FR.Continue Crestor--lasix resumed 6/8. -Will check orthostatic vitals as reporting dizziness with positional changes. -daily weights   Filed Weights   05/27/19 0552 05/28/19 0522 05/29/19 0558  Weight: 86.4 kg 84.2 kg 84.3 kg    LOS: 3 days A FACE TO FACE EVALUATION WAS PERFORMED  [name] 05/29/2019, 4:01 PM

## 2019-05-29 NOTE — Patient Care Conference (Signed)
Inpatient RehabilitationTeam Conference and Plan of Care Update Date: 05/28/2019   Time:   11:25 AM    Patient Name: [name].      Medical Record Number: [id number]  Date of Birth: Sep 12, 1941 Sex: Male         Room/Bed: 4W16C/4W16C-01 Payor Info: Payor: MEDICARE / Plan: MEDICARE PART A AND B / Product Type: *No Product type* /    Admitting Diagnosis: CVA 2 Team  CVA no paresis; 13-15days  Admit Date/Time:  05/26/2019  4:22 PM Admission Comments: No comment available   Primary Diagnosis:  <principal problem not specified> Principal Problem: <principal problem not specified>  Patient Active Problem List   Diagnosis Date Noted  . Debility 05/26/2019  . 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 Toe infection 05/17/2019  . DOE (dyspnea on exertion) 05/15/2019  . Severe tricuspid regurgitation 05/15/2019  . Acute right heart failure ([place]) 05/15/2019  . Cellulitis of fourth toe of left foot   . Excessive daytime sleepiness 02/11/2019  . 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[place]) 02/11/2019  . Iron deficiency anemia 02/04/2019  . Poor memory 12/16/2018  . Deficiency anemia 11/19/2018  . Other fatigue 11/19/2018  . History of elevated PSA 11/19/2018  . Prostate cancer screening 11/19/2018  . Prostate CA ([place]) 11/19/2018  . Pancytopenia, acquired ([place]) 08/13/2018  . Recurrent falls 08/13/2018  . 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 Peripheral neuropathy 07/31/2018  . 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 Anemia, chronic disease 01/28/2018  . History of skin cancer 07/27/2017  . Fatty liver disease, nonalcoholic 79/01/4096  . Thrombocytopenia ([place]) 11/27/2016  . Lung nodule 04/23/2014  . Postinflammatory pulmonary fibrosis ([place]) 06/13/2013  . Long term (current) use of anticoagulants 11/22/2011  . Abdominal bruit 03/07/2011  . Pacemaker-[employer]ude   . Hx of CABG   . Hyperlipidemia type II   . OSA  (obstructive sleep apnea)   . PACEMAKER, PERMANENT 08/17/2010  . DIABETES MELLITUS 08/16/2010  . HYPERLIPIDEMIA 08/16/2010  . Obesity 08/16/2010  . Essential hypertension 08/16/2010  . Atrial fibrillation ([place]) 08/16/2010  . IRREGULAR HEART RATE 08/16/2010  . Sleep apnea 08/16/2010  . BENIGN PROSTATIC HYPERTROPHY, HX OF 08/16/2010    Expected Discharge Date: Expected Discharge Date: 06/06/19  Team Members Present: Physician leading conference: Dr. Alysia Penna Social Worker Present: Alfonse Alpers, LCSW Nurse Present: Starlyn Skeans, LPN PT Present: Michaelene Song, PT OT Present: Clyda Greener, OT SLP Present: Stormy Fabian, SLP PPS Coordinator present : Ileana Ladd, PT     Current Status/Progress Goal Weekly Team Focus  Medical   hx of multiple fall, occ bladder incont  maintain med stability  may stop CBGs   Bowel/Bladder   continent B/B Last Bm 05/27/2019  Remain continent of B/B  toilet prn   Swallow/Nutrition/ Hydration             ADL's   supervision for UB selfcare, min to mod assist for LB selfcare, min assist for tranfers with use of the RW for support  supervision overall  selfcare retraining, transfer training, balance retraining, therapeutic exercise, DME/AE education   Mobility   min assist for all mobility with RW up to 150 ft, poor balance with Merrilee Jansky 20/56  supervision with RW  balance, strength, gait, transfers, education, safety   Communication             Safety/Cognition/ Behavioral Observations            Pain  pt offers no complaints of pain  remain pain free   assess pain q shift and medicate prn    Skin   MASD bilateral groin area, linear fissure mid coccyx healed, BUE ecchymosis, surgical wound amputated toe with suture dry dressing   Improvement of current skin conditions and no further breakdown or infection   check skin q shift and prn     Rehab Goals Patient on target to meet rehab goals: Yes Rehab Goals Revised: none *See Care  Plan and progress notes for long and short-term goals.     Barriers to Discharge  Current Status/Progress Possible Resolutions Date Resolved   Physician    Medical stability;Other (comments)  hx falls  progressing toward goals  cont rehab      Nursing  Medication compliance;Weight;Incontinence               PT  Behavior;Other (comments)(hx of multiple falls)                 OT                  SLP                SW                Discharge Planning/Teaching Needs:  Pt to d/c to his home with family to provide necessary care.  Family can come in for family education closer to d/c.   Team Discussion:  Pt with a fib, pulmonary fibrosis, lewy body dementia, and toe amputation.  Pt has no c/o pain.  Pt's toe is open to air during the day and then dressed at night.  It is bleeding a little.  Pt is continent x2 and blood sugars are good and CBGs can be discontinued per Dr. Letta Pate.  Pt's BP was low at first, but is leveling out some.  Pt is S with UB bathing and dresing, min A for LB and toilet transfers.  Pt had trouble reaching his feet even PTA and wife assisted.  Pt has trouble doing a task with divided attention.  Pt walked 150' with rolling walker with LOB episodes, corrected with min A.  Pt scored 20/56 on BERG.  He has a hx of falls and needs to use the walker and not the cane.  Goals are supervision.   Revisions to Treatment Plan:  none    Continued Need for Acute Rehabilitation Level of Care: The patient requires daily medical management by a physician with specialized training in physical medicine and rehabilitation for the following conditions: Daily direction of a multidisciplinary physical rehabilitation program to ensure safe treatment while eliciting the highest outcome that is of practical value to the patient.: Yes Daily medical management of patient stability for increased activity during participation in an intensive rehabilitation regime.: Yes Daily analysis of  laboratory values and/or radiology reports with any subsequent need for medication adjustment of medical intervention for : Neurological problems;Other   I attest that I was present, lead the team conference, and concur with the assessment and plan of the team.Team conference was held via web/ teleconference due to [place] - 19.   Lucille Crichlow, Silvestre Mesi 05/29/2019, 2:38 PM

## 2019-05-29 NOTE — Progress Notes (Signed)
/  Physical Therapy Session Note  Patient Details  Name: [name]. MRN: [id number] Date of Birth: 1941/10/31  Today's Date: 05/29/2019 PT Individual Time: 9211-9417 PT Individual Time Calculation (min): 73 min   Short Term Goals: Week 1:  PT Short Term Goal 1 (Week 1): STG=LTG due to ELOS  Skilled Therapeutic Interventions/Progress Updates:    Pt received sitting in w/c finishing lunch and agreeable to therapy session. Vitals assessed: BP 97/71 (MAP 81), HR 88bpm. Therapist donned R LE thigh high TED hose for BP management during session. Pt wearing L post-op shoe throughout session. Transported to gym in w/c. Sitting vitals: BP 98/74 (MAP 83), HR 62bpm then standing vitals BP 94/63 (MAP 73), HR 81bpm. Ambulated ~133ft using RW with CGA for safety then without AD ~178ft with CGA/min assist and slight increased postural sway/path deviation but no overt LOB compared to with AD. After ambulating vitals reassessed BP 91/71 (MAP 78), HR 91bpm. 1x pt initiating sit>stand EOM>no AD and had moderate R lateral LOB resulting in him returning to sitting on mat with assist. Performed cone weaving ambulation, no UE support, with CGA for steadying. Standing balance task of alternate B LE cone taps progressed to 1/2 circle cone taps on verbal command with CGA/min assist for balance. Performed repeated R LE/L LE step-up on 4" step with BUE support on RW progressed to no UE support all with min assist for balance - cuing for sequencing/technique. Reassessed vitals BP 107/74 (MAP 84), HR 88bpm. Performed lateral side stepping at hallway rail with B UE support transitioned to no UE support with CGA primarily with intermittent min assist for balance. Lateral side stepping over tape on floor no UE support and CGA for steadying. Ambulated ~125ft back to room using RW with CGA and w/c follow for safety. Pt left sitting on EOB with nursing staff present to assist with pt using the bathroom.   Therapy  Documentation Precautions:  Precautions Precautions: Fall Required Braces or Orthoses: Other Brace Other Brace: post op shoe--L Restrictions Weight Bearing Restrictions: No LLE Weight Bearing: Weight bearing as tolerated  Vital Signs: Additional details above. Therapy Vitals Pulse Rate: 88 BP: 107/74 Patient Position (if appropriate): Sitting(after activity with therapy)  Pain:   Denies pain throughout therapy session stating he has had "no pain through this process."   Therapy/Group: Individual Therapy  Tawana Scale, PT, DPT 05/29/2019, 7:58 AM

## 2019-05-29 NOTE — Progress Notes (Signed)
Placed patient on home CPAP unit for the night.  

## 2019-05-30 ENCOUNTER — Inpatient Hospital Stay (HOSPITAL_COMMUNITY): Payer: Medicare Other | Admitting: Occupational Therapy

## 2019-05-30 ENCOUNTER — Inpatient Hospital Stay (HOSPITAL_COMMUNITY): Payer: Medicare Other | Admitting: Physical Therapy

## 2019-05-30 NOTE — Progress Notes (Signed)
Occupational Therapy Session Note  Patient Details  Name: [name]. MRN: [id number] Date of Birth: [birth year]  Today's Date: 05/30/2019 OT Individual Time: 1255-1340 OT Individual Time Calculation (min): 45 min    Short Term Goals: Week 1:  OT Short Term Goal 1 (Week 1): Short term goals equal to LTGs set at supervision level.  Skilled Therapeutic Interventions/Progress Updates:    Pt seen for OT session focusing on functional activity tolerance, UE strengthening and education. Pt up in w/c upon arrival finishing lunch, agreeable to tx session and denying pain. While pt finished lunch, discussed progress in therapy including trialing of different ADs. Education/discussion regarding pros/cons of each in regards to functional mobility.  He self propelled w/c to therapy gym using B UEs for generalized strengthening and conditioning.  STand pivot transfers and functional ambulation completed throughout session using RW with close supervision. Completed seated punching bag activitiy for UE strengthening and functional endurance, progressed to completing from standing position with min A for balance. Pt tolerating ~30 second Perrie Ragin of constant punching before requiring seated rest break. Seated EOM, completed x3 sets of 10 of the following exercises using #4 dowel rod.  Chest press  Overhead press  Bicep curl. Multimodal cuing provided for proper form and technique. Pt unable to recall exercises between sets needing continuous cuing throughout. He ambulated back to room at end of session with supervision using RW. Pt left seated in w/c at end of session, all needs in reach.   Therapy Documentation Precautions:  Precautions Precautions: Fall Required Braces or Orthoses: Other Brace Other Brace: fracture shoe on the left foot Restrictions Weight Bearing Restrictions: Yes LLE Weight Bearing: Weight bearing as tolerated   Therapy/Group: Individual Therapy  [name]  L 05/30/2019, 7:09 AM

## 2019-05-30 NOTE — Progress Notes (Signed)
Occupational Therapy Session Note  Patient Details  Name: [name]. MRN: [id number] Date of Birth: September 22, 1941  Today's Date: 05/30/2019 OT Individual Time: 1049-1200 OT Individual Time Calculation (min): 71 min    Short Term Goals: Week 1:  OT Short Term Goal 1 (Week 1): Short term goals equal to LTGs set at supervision level.  Skilled Therapeutic Interventions/Progress Updates:    Pt completed shower and dressing during session.  Min guard assist for transfer into the shower and for removal of clothing.  He was able to complete all UB and LB bathing sit sit to stand with min guard assist.  Min instructional cueing to sit when washing his UB and for washing his hair as he likes to stand at times.  He was able to transfer out to the wheelchair at the sink for dressing.  All dressing completed min guard assist sit to stand.  He was able to complete grooming task of combing his hair with supervision as well.  Next had pt finish session with functional mobility with min guard assist using the RW.  He ambulated over 150' without any difficulty.  He also completed up and down eight 3" steps using the rail with min guard assist.  No report of dizziness or pain.  BP taken in sitting during session at 99/67.  Finished session with return to the room and call button and phone in reach with alarm belt in place.    Therapy Documentation Precautions:  Precautions Precautions: Fall Required Braces or Orthoses: Other Brace Other Brace: fracture shoe on the left foot Restrictions Weight Bearing Restrictions: No LLE Weight Bearing: Weight bearing as tolerated  Pain: Pain Assessment Pain Scale: Faces Faces Pain Scale: No hurt ADL: See Care Tool Section for some details of ADL  Therapy/Group: Individual Therapy  Mcgwire Dasaro OTR/L  05/30/2019, 12:39 PM

## 2019-05-30 NOTE — Progress Notes (Signed)
[facility] PHYSICAL MEDICINE & REHABILITATION PROGRESS NOTE   Subjective/Complaints:  BPs running low but no dizziness  ROS- no CP, SOB, N/V?D   Objective:   No results found. Recent Labs    05/29/19 0552  WBC 6.4  HGB 11.2*  HCT 35.7*  PLT 124*   Recent Labs    05/29/19 0552  NA 140  K 4.6  CL 104  CO2 28  GLUCOSE 91  BUN 32*  CREATININE 1.38*  CALCIUM 9.4    Intake/Output Summary (Last 24 hours) at 05/30/2019 0834 Last data filed at 05/30/2019 0239 Gross per 24 hour  Intake 222 ml  Output 1050 ml  Net -828 ml     Physical Exam: Vital Signs Blood pressure (!) 104/92, pulse (!) 55, temperature 97.6 F (36.4 C), temperature source Oral, resp. rate 18, weight 84.2 kg, SpO2 97 %.   General: No acute distress Mood and affect are appropriate Oriented x 3  Heart: Regular rate and rhythm no rubs murmurs or extra sounds Lungs: Clear to auscultation, breathing unlabored, no rales or wheezes Abdomen: Positive bowel sounds, soft nontender to palpation, nondistended Extremities: No clubbing, cyanosis, or edema Skin: No evidence of breakdown, Left foot bulky dressing, + stasis dermatitis Neurologic: Cranial nerves II through XII intact, motor strength is 4/5 in bilateral deltoid, bicep, tricep, grip, hip flexor, knee extensors, ankle dorsiflexor and plantar flexor  Cerebellar exam normalmild intention tremor in BUE otherwise WNL     Musculoskeletal: Full range of motion in all 4 extremities. No joint swelling    Assessment/Plan: 1. 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  Physiatrist is providing close team supervision and 24 hour management of active medical problems listed below.  Physiatrist and rehab team continue to assess barriers to discharge/monitor patient progress toward functional and medical goals  Care Tool:  Bathing    Body parts  bathed by patient: Right arm, Left arm, Chest, Abdomen, Front perineal area, Buttocks, Left upper leg, Right upper leg, Face, Right lower leg, Left lower leg         Bathing assist Assist Level: Minimal Assistance - Patient > 75%     Upper Body Dressing/Undressing Upper body dressing   What is the patient wearing?: Pull over shirt    Upper body assist Assist Level: Supervision/Verbal cueing    Lower Body Dressing/Undressing Lower body dressing      What is the patient wearing?: Pants     Lower body assist Assist for lower body dressing: Minimal Assistance - Patient > 75%     Toileting Toileting    Toileting assist Assist for toileting: Contact Guard/Touching assist     Transfers Chair/bed transfer  Transfers assist     Chair/bed transfer assist level: Contact Guard/Touching assist     Locomotion Ambulation   Ambulation assist      Assist level: Contact Guard/Touching assist Assistive device: Walker-rolling Max distance: 129ft   Walk 10 feet activity   Assist     Assist level: Contact Guard/Touching assist Assistive device: Walker-rolling   Walk 50 feet activity   Assist    Assist level: Contact Guard/Touching assist Assistive device: Walker-rolling    Walk 150 feet activity   Assist    Assist level: Contact Guard/Touching assist Assistive device: Walker-rolling    Walk 10 feet on uneven surface  activity   Assist     Assist level: Minimal Assistance - Patient > 75%  Wheelchair     Assist Will patient use wheelchair at discharge?: No(TBD but do not anticipate will need) Type of Wheelchair: Manual    Wheelchair assist level: Supervision/Verbal cueing Max wheelchair distance: 150 ft    Wheelchair 50 feet with 2 turns activity    Assist        Assist Level: Supervision/Verbal cueing   Wheelchair 150 feet activity     Assist     Assist Level: Supervision/Verbal cueing    Medical Problem List and  Plan: 1.Functional deficitssecondary to debility and brainstem CVA Continue CIR PT OT 2. Antithrombotics: -DVT/anticoagulation:Mechanical:Sequential compression devices, below kneeBilateral lower extremities -antiplatelet therapy: ASA 3. Pain Management:tylenol prn 4. Mood:LCSW to follow for evaluation and support. -antipsychotic agents: N/A 5. Neuropsych: This patient is not fullycapable of making decisions on hisown behalf. 6. Skin/Wound Care:Routine pressure relief measures. 7. Fluids/Electrolytes/Nutrition:MOnitor I/o.  Weights going down, BUN creatinine rising, will reduce Lasix to 40 mg twice daily expect BUN to correct recheck BMET on Monday  8.Left great toe amputation:Monitor for healing. 9. CAD s/p PPM/CAF: Monitor HR tid--continue ASA and Crestor. Metoprolol resumed on 6/8--monitor for recurrent bradycardia. 10. Pulmonary fibrosis/OSA: Continue to encourage use of CPAP when sleeping. 11. T2DM-diet controlled: Hgb A 1c- 5.6. Will monitor BS ac/hs and use SSI for elevated BS.  No CBG needed 12. Lewy body dementia?: Hallucinations with STM deficits and falls per notes. Neurology following with work up underway. Avoid neuroleptics  13. [name] with pancytopenia/severe peripheral neuropathy:Monitor platelets 14. H/p BPH: continue on flomax 15. Acute on chronic systolic and diastolic heart failure: Monitor daily weights. Strict I/O--1200 cc FR.Continue Crestor--lasix resumed 6/8. -Will check orthostatic vitals as reporting dizziness with positional changes. -daily weights- stable 6/12   Filed Weights   05/28/19 0522 05/29/19 0558 05/30/19 0550  Weight: 84.2 kg 84.3 kg 84.2 kg    LOS: 4 days A FACE TO FACE EVALUATION WAS PERFORMED  Charlett Blake 05/30/2019, 8:34 AM

## 2019-05-30 NOTE — Progress Notes (Signed)
Occupational Therapy Session Note  Patient Details  Name: [name]. MRN: [id number] Date of Birth: December 29, 1940  Today's Date: 05/30/2019 OT Individual Time: 4967-5916 OT Individual Time Calculation (min): 27 min    Short Term Goals: Week 1:  OT Short Term Goal 1 (Week 1): Short term goals equal to LTGs set at supervision level.  Skilled Therapeutic Interventions/Progress Updates:    Pt completed functional mobility down to the ortho gym with min guard assist using the RW for support.  Pt continues to have difficulty with his cast shoe coming off of the back of his foot when ambulating.  Therapist attempted to tape the shoe but it was not successful.  Once in the gym he was able to complete ten mins of exercise on the UE ergonometer for strengthening and endurance.  Resistance was set on level 10 and of Random Program.  He was able to maintain RPMs at 30-35 throughout the session.  He ambulated back to the room at end of session and transfer back to the bed with min guard.  Call button and phone in reach with bed alarm in place.      Therapy Documentation Precautions:  Precautions Precautions: Fall Required Braces or Orthoses: Other Brace Other Brace: fracture shoe on the left foot Restrictions Weight Bearing Restrictions: No LLE Weight Bearing: Weight bearing as tolerated  Pain:  No report of pain    Therapy/Group: Individual Therapy  Ricci Paff OTR/L 05/30/2019, 4:36 PM

## 2019-05-30 NOTE — Progress Notes (Signed)
Social Work Assessment and Plan  Patient Details  Name: [name]. MRN: [id number] Date of Birth: 1941-10-21  Today's Date: 05/30/2019  Problem List:  Patient Active Problem List   Diagnosis Date Noted  . Debility 05/26/2019  . 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 Toe infection 05/17/2019  . DOE (dyspnea on exertion) 05/15/2019  . Severe tricuspid regurgitation 05/15/2019  . Acute right heart failure ([place]) 05/15/2019  . Cellulitis of fourth toe of left foot   . Excessive daytime sleepiness 02/11/2019  . 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[place]) 02/11/2019  . Iron deficiency anemia 02/04/2019  . Poor memory 12/16/2018  . Deficiency anemia 11/19/2018  . Other fatigue 11/19/2018  . History of elevated PSA 11/19/2018  . Prostate cancer screening 11/19/2018  . Prostate CA ([place]) 11/19/2018  . Pancytopenia, acquired ([place]) 08/13/2018  . Recurrent falls 08/13/2018  . 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 Peripheral neuropathy 07/31/2018  . 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 Anemia, chronic disease 01/28/2018  . History of skin cancer 07/27/2017  . Fatty liver disease, nonalcoholic 65/78/4696  . Thrombocytopenia ([place]) 11/27/2016  . Lung nodule 04/23/2014  . Postinflammatory pulmonary fibrosis ([place]) 06/13/2013  . Long term (current) use of anticoagulants 11/22/2011  . Abdominal bruit 03/07/2011  . Pacemaker-St.Jude   . Hx of CABG   . Hyperlipidemia type II   . OSA (obstructive sleep apnea)   . PACEMAKER, PERMANENT 08/17/2010  . DIABETES MELLITUS 08/16/2010  . HYPERLIPIDEMIA 08/16/2010  . Obesity 08/16/2010  . Essential hypertension 08/16/2010  . Atrial fibrillation ([place]) 08/16/2010  . IRREGULAR HEART RATE 08/16/2010  . Sleep apnea 08/16/2010  . BENIGN PROSTATIC HYPERTROPHY, HX OF 08/16/2010   Past Medical History:  Past Medical History:  Diagnosis Date  . (HFpEF) heart failure with  preserved ejection fraction ([place])    a. 05/2013 Echo: EF 55%, mild LVH, diast dysfxn, Ao sclerosis, mildly dil LA, RV dysfxn (poorly visualized), PASP 79mHg;  b. 03/2017 Echo: EF 55-60%, no rwma, triv MR, mildly dil RV, mod TR, PASP 446mg.  . [facility]   s[facility]p Cox Maze 1/09;  Multaq Rx d/c'd in 2014 due to pulmo fibrosis;  coumadin d/c'd in 2014 due to spontaneous subdural hematoma  . BPH (benign prostatic hyperplasia)   . CAD (coronary artery disease), native coronary artery    a. s/p CABG 12/2007;  b. Myoview 12/2011: EF 66%, no scar or ischemia; normal.  . DM (diabetes mellitus) ([facility]  . Hyperlipidemia type II   . Hypertension   . 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 OSA (obstructive sleep apnea)   . Pacemaker    PPM - St. Jude  . Peripheral neuropathy 07/31/2018  . Pulmonary fibrosis ([facility]   Multaq d/c'd 7/14  . Subdural hematoma ([place]/2013   spontaneous;  coumadin d/c'd => no longer a candidate for anticoagulation   Past Surgical History:  Past Surgical History:  Procedure Laterality Date  . AMPUTATION Left 05/17/2019   Procedure: AMPUTATION LEFT FOURTH TOE;  Surgeon: DeMeredith PelMD;  Location: [facility] Service: [facility]opedics;  Laterality: Left;  . APPENDECTOMY    . CHOLECYSTECTOMY    . CORONARY ARTERY BYPASS GRAFT     x3 (left internal mammary artery to distal left anterior descending coronary artery, saphenous vain graft to second circumflex marginal branch, saphenous vain graft to posterior descending coronary artery, endoscopic saphenous vain harvest from  right thigh) and modified Cox - Maze IV procedure.  [name],MD. Electronically signed CHO/MEDQ D: 01/09/2008/ JOB: [id number] cc:  [name]  . [name]  07/30/2012   Procedure: CRANIOTOMY HEMATOMA EVACUATION SUBDURAL;  Surgeon: [name], MD;  Location: [facility];  Service: Neurosurgery;  Laterality: Right;  Right craniotomy for evacuation of  subdural hematoma  . FOOT SURGERY    . HERNIA REPAIR    . ORCHIECTOMY     Left  /  testicular cancer  . PACEMAKER PLACEMENT     PPM - St. Jude   Social History:  reports that he quit smoking about 28 years ago. He has never used smokeless tobacco. He reports that he does not drink alcohol or use drugs.  Family / Support Systems Marital Status: Married How Long?: 77 years Patient Roles: Spouse, Parent, Other (Comment)(grandparent) Spouse/Significant Other: [name] - wife - [phone] (h); [phone] Children: [name] - dtr - [phone] Other Supports: son Anticipated Caregiver: pt's wife, [name] Ability/Limitations of Caregiver: min assist Caregiver Availability: 24/7 Family Dynamics: supportive family  Social History Preferred language: English Religion: Baptist Read: Yes Write: Yes Employment Status: English as a second language teacher, Engineer, structural, 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  Wife is next of kin for decision making.   Abuse/Neglect Abuse/Neglect Assessment Can Be Completed: Yes Physical Abuse: Denies Verbal Abuse: Denies Sexual Abuse: Denies Exploitation of patient/patient's resources: Denies Self-Neglect: Denies  Emotional Status Pt's affect, behavior and adjustment status: Pt is upbeat and positive and "takes things as they come in life." Recent Psychosocial Issues: multiple falls; hospitalizations Psychiatric History: none reported Substance Abuse History: none reported  Patient / Family Perceptions, Expectations & Goals Pt/Family understanding of illness & functional limitations: Pt/family have a good understanding of pt's condition/limitations. Premorbid pt/family roles/activities: Pt liked to work in his yard. Anticipated changes in roles/activities/participation: Wife has already said pt will not  be working in the yard and has hired someone to do it. Pt/family expectations/goals: Pt wants to go home once he is strong enough and ready to do so.  Community [employer] Agencies: None Premorbid Home Care/DME Agencies: Other (Comment)(Pt is active with [facility].  He has a walker, rollator, built in shower seat, and bedside commode.) Transportation available at discharge: family Resource referrals recommended: Support group (specify)(stroke support group)  Discharge Planning Living Arrangements: Spouse/significant other Support Systems: Spouse/significant other, Children, Other relatives, Friends/neighbors, Home care staff Type of Residence: Private residence Insurance Resources: [employer], Multimedia programmer (specify) Financial Resources: Radio broadcast assistant Screen Referred: No Money Management: Patient, Spouse Does the patient have any problems obtaining your medications?: No Home Management: Pt and spouse shared these tasks.  Wife and hired professionals will manage now. Patient/Family Preliminary Plans: Pt plans to return to his home where his wife will be with him 24/7 for supervision. Social Work Anticipated Follow Up Needs: HH/OP, Support Group Expected length of stay: 7 to 9 days  Clinical Impression CSW met with pt and later with wife via telephone to introduce self and role of CSW, as well as to complete assessment.  Pt is motivated to work with therapists and to get stronger.  He wants to be able to take care of himself again.  Pt has good support from his wife and is feeling well emotionally.  He reports that he's not the type of person to self  pity or get stuck feeling down about something, but instead keeps moving and just sees things like this as part of life.  No current needs/concerns/questions.  CSW will continue to follow and assist as needed.  Cherylanne Ardelean, Silvestre Mesi 05/30/2019, 5:39 PM

## 2019-05-30 NOTE — Progress Notes (Signed)
[facility] Individual Statement of Services  Patient Name:  [name].  Date:  05/30/2019  Welcome to the [facility].  Our goal is to provide you with an individualized program based on your diagnosis and situation, designed to meet your specific needs.  With this comprehensive rehabilitation program, you will be expected to participate in at least 3 hours of rehabilitation therapies Monday-Friday, with modified therapy programming on the weekends.  Your rehabilitation program will include the following services:  Physical Therapy (PT), Occupational Therapy (OT), 24 hour per day rehabilitation nursing, Case Management (Social Worker), Rehabilitation Medicine, Nutrition Services and Pharmacy Services  Weekly team conferences will be held on Wednesdays to discuss your progress.  Your Social Worker will talk with you frequently to get your input and to update you on team discussions.  Team conferences with you and your family in attendance may also be held.  Expected length of stay:  7 to 9 days  Overall anticipated outcome:  Supervision  Depending on your progress and recovery, your program may change. Your Social Worker will coordinate services and will keep you informed of any changes. Your Social Worker's name and contact numbers are listed  below.  The following services may also be recommended but are not provided by the [facility] will be made to provide these services after discharge if needed.  Arrangements include referral to agencies that provide these services.  Your insurance has been verified to be:  Medicare and [place] Your primary doctor is:  Dr. [name]  Pertinent information will be shared with your doctor and your insurance company.  Social Worker:  Alfonse Alpers, LCSW  [phone] or  ([phone]  Information discussed with and copy given to patient by: Trey Sailors, 05/30/2019, 5:27 PM

## 2019-05-30 NOTE — Progress Notes (Signed)
Physical Therapy Session Note  Patient Details  Name: [name]. MRN: [id number] Date of Birth: September 08, 1941  Today's Date: 05/30/2019 PT Individual Time: 0803-0903 PT Individual Time Calculation (min): 60 min   Short Term Goals: Week 1:  PT Short Term Goal 1 (Week 1): STG=LTG due to ELOS  Skilled Therapeutic Interventions/Progress Updates:    Pt received sitting in w/c and agreeable to therapy session. Based on pt's AM vitals therapist donned R LE thigh high TED hose and notified MD. Transported to gym in w/c. Assessed vitals: BP 104/92  (MAP 98), HR 55bpm. Ambulated ~173ft using RW with close supervision for safety. Performed dynamic standing balance task of stepping forward/backwards and then side stepping over a line on floor without UE support and min assist for balance - pt stepped at very slow speed to increase balance. Progressed to 4 square stepping over line on floor without UE support and min assist for balance. Repeated sit<>stand from EOM without UE support 2x10 repetitions with CGA progressed to min assist for balance with increased fatigue. Performed standing balance on red foam wedge without UE support while tossing horseshoes with CGA/min assist for balance due to 3x minor posterior LOB. Ambulated ~159ft usig RW back to room with close supervision/CGA for safety/steadying. Pt left sitting in w/c with needs in reach, chair alarm on, and MD present for morning rounds.  Therapy Documentation Precautions:  Precautions Precautions: Fall Required Braces or Orthoses: Other Brace Other Brace: fracture shoe on the left foot Restrictions Weight Bearing Restrictions: Yes LLE Weight Bearing: Weight bearing as tolerated  Pain:   Denies pain throughout session.   Therapy/Group: Individual Therapy  Tawana Scale, PT, DPT 05/30/2019, 7:52 AM

## 2019-05-31 ENCOUNTER — Inpatient Hospital Stay (HOSPITAL_COMMUNITY): Payer: Medicare Other | Admitting: Occupational Therapy

## 2019-05-31 ENCOUNTER — Inpatient Hospital Stay (HOSPITAL_COMMUNITY): Payer: Medicare Other | Admitting: Physical Therapy

## 2019-05-31 NOTE — Progress Notes (Signed)
Physical Therapy Session Note  Patient Details  Name: [name]. MRN: [id number] Date of Birth: [birth year]  Today's Date: 05/31/2019 PT Individual Time: 3007-6226 and 3335-4562 PT Individual Time Calculation (min): 61 min and 44 min   Short Term Goals: Week 1:  PT Short Term Goal 1 (Week 1): STG=LTG due to ELOS  Skilled Therapeutic Interventions/Progress Updates:    Session 1: Pt received alert, supine in bed and agreeable to therapy session. Supine>sit, HOB partially elevated and using bedrails, with supervision. Pt noted to be incontinent of bladder and pt reports when he is asleep that he sleeps so soundly that he cannot tell when he needs to use the bathroom. Performed LB clothing management with mod/max assist for time management and peri-care with set-up assist while standing with RW and CGA for steadying. Sit>stand with min assist on first trial then progressed to CGA/supervision for remainder of session. Ambulated ~183ft using RW to therapy gym with CGA/close supervision for steadying/safety. Ambulated ~144ft, no AD, with min assist/CGA for balance. Started higher level gait training of cone weaving and pt reports onset of dizziness returned to sitting EOM with vitals assessed BP 94/59 (MAP 74), HR 54bpm reassessed in standing BP 109/60 (MAP 76), HR 54bpm and reports "dizziness" described as "my head and body trying to move right and left at the same time." Performed ambulatory cone weaving, no AD, x2 with pt denying dizziness throughout and requiring min assist for balance on first trial progressed to CGA 2nd trial. Performed posterior and R lateral stepping strategy retraining with pt in overhead harness system for increased safety - required varying from CGA to max assist to maintain upright with pt demonstrating inadequate step length despite cuing. Ambulated ~15ft back to room using RW with CGA/close supervision. Pt left sitting in w/c with needs in reach and chair alarm on.    Session 2: Pt received supine in bed finishing eating lunch and agreeable to therapy session. Therapist educated patient on follow-up therapy recommendations as well as need for family education to ensure patient safety at home. Supine>sit, HOB elevated and using bedrails, with supervision. Sit<>stand EOB/EOM/chair<>RW with CGA/close supervision throughout session. Ambulated ~51ft using RW to bathroom with close supervision/CGA for safety/steadying. Performed LB clothing management with CGA and continent of urine. Ambulated ~33ft to sink using RW and performed standing hand hygiene with close supervision for safety. Ambulated ~1106ft using RW to therapy gym with varying close supervision/CGA for safety/steadying. Ascended/descended 8 steps (6" height) using unilateral UE support on L handrail to replicate home environment with CGA for steadying. Pt reports he used a rollator at home and requests to trial it - pt educated on rollator safety with breaks and seat. Ambulated ~65ft using rollator with CGA progressed to close supervision for safety with pt demonstrating no more unsteadiness than when ambulating with RW. Performed cone weaving using rollator with CGA for steadying and pt demonstrating good AD management as well as good safety awareness with locking breaks during sit<>stand transfers. Ambulated ~126ft back to room using rollator with close supervision and 1 instance of anterior LOB due to the rollator continuing to move forward but patient not stepping forward requiring min/mod assist to maintain upright. Pt left supine in bed with needs in reach and bed alarm on.   Therapy Documentation Precautions:  Precautions Precautions: Fall Required Braces or Orthoses: Other Brace Other Brace: fracture shoe on the left foot Restrictions Weight Bearing Restrictions: Yes LLE Weight Bearing: Weight bearing as tolerated  Pain:  Session 1: Denies pain this session.   Session 2: Denies pain during  session.   Therapy/Group: Individual Therapy  Tawana Scale, PT, DPT 05/31/2019, 7:48 AM

## 2019-05-31 NOTE — Progress Notes (Signed)
[facility] PHYSICAL MEDICINE & REHABILITATION PROGRESS NOTE   Subjective/Complaints: Patient feels well.  He is encouraged with physical therapy and its results.   Objective:  Physical Exam: Vital Signs Blood pressure 109/60, pulse (!) 54, temperature 97.6 F (36.4 C), temperature source Oral, resp. rate 16, weight 84.2 kg, SpO2 99 %.  elderly male in no acute distress. HEENT exam atraumatic, normocephalic, neck supple without jugular venous distention. Chest clear to auscultation cardiac exam S1-S2 are regular. Abdominal exam with bowel sounds, soft and nontender. Extremities no edema. Neurologic exam is alert .    Assessment/Plan: 1. 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 Antithrombotics: -DVT/anticoagulation:Mechanical:Sequential compression devices, below kneeBilateral lower extremities -antiplatelet therapy: ASA 3. Pain Management:tylenol prn 4. Mood:LCSW to follow for evaluation and support. -antipsychotic agents: N/A 5. Neuropsych: This patient is not fullycapable of making decisions on hisown behalf. 6. Skin/Wound Care:Routine pressure relief measures. 7. Fluids/Electrolytes/Nutrition:MOnitor I/o.   Basic Metabolic Panel:    Component Value Date/Time   NA 140 05/29/2019 0552   NA 145 (H) 01/30/2019 1532   NA 141 02/23/2017 1237   K 4.6 05/29/2019 0552   K 3.8 02/23/2017 1237   CL 104 05/29/2019 0552   CO2 28 05/29/2019 0552   CO2 27 02/23/2017 1237   BUN 32 (H) 05/29/2019 0552   BUN 15 01/30/2019 1532   BUN 18.3 02/23/2017 1237   CREATININE 1.38 (H) 05/29/2019 0552   CREATININE 1.2 02/23/2017 1237   GLUCOSE 91 05/29/2019 0552   GLUCOSE 113 02/23/2017 1237   CALCIUM 9.4 05/29/2019 0552   CALCIUM 9.6 02/23/2017 1237     8.Left great toe amputation:Monitor for healing. 9. CAD s/p  PPM/CAF: Continue current medications. 10. Pulmonary fibrosis/OSA: Continue to encourage use of CPAP when sleeping. 11. T2DM-diet controlled: Hgb A 1c- 5.6.  CBG (last 3)  No results for input(s): GLUCAP in the last 72 hours.   12. Lewy body dementia?: Will need continued follow-up. 13. [name] with pancytopenia/severe peripheral neuropathy:Monitor platelets CBC:    Component Value Date/Time   WBC 6.4 05/29/2019 0552   HGB 11.2 (L) 05/29/2019 0552   HGB 11.5 (L) 04/23/2018 0952   HGB 11.5 (L) 07/26/2017 1500   HCT 35.7 (L) 05/29/2019 0552   HCT 33.4 (L) 12/16/2018 1442   HCT 34.9 (L) 07/26/2017 1500   PLT 124 (L) 05/29/2019 0552   PLT 103 (L) 04/23/2018 0952   MCV 88.8 05/29/2019 0552   MCV 89 04/23/2018 0952   MCV 93.4 07/26/2017 1500   NEUTROABS 3.8 05/27/2019 0517   NEUTROABS 3.0 07/26/2017 1500   LYMPHSABS 0.9 05/27/2019 0517   LYMPHSABS 1.1 07/26/2017 1500   MONOABS 0.3 05/27/2019 0517   MONOABS 0.2 07/26/2017 1500   EOSABS 0.2 05/27/2019 0517   EOSABS 0.2 07/26/2017 1500   BASOSABS 0.1 05/27/2019 0517   BASOSABS 0.0 07/26/2017 1500     14. H/p BPH: continue on flomax 15. Acute on chronic systolic and diastolic heart failure: Monitor daily weights.    Filed Weights   05/29/19 0558 05/30/19 0550 05/31/19 0558  Weight: 84.3 kg 84.2 kg 84.2 kg    LOS: 5 days A FACE TO FACE EVALUATION WAS PERFORMED  Fantashia Shupert H Donnajean Chesnut 05/31/2019, 8:46 AM

## 2019-05-31 NOTE — Progress Notes (Signed)
Occupational Therapy Session Note  Patient Details  Name: [name]. MRN: [id number] Date of Birth: March 22, 1941  Today's Date: 05/31/2019  Session 1 OT Individual Time: 4462-8638 OT Individual Time Calculation (min): 55 min   Session 2 OT Individual Time: 1771-1657 OT Individual Time Calculation (min): 30 min   Short Term Goals: Week 1:  OT Short Term Goal 1 (Week 1): Short term goals equal to LTGs set at supervision level.  Skilled Therapeutic Interventions/Progress Updates:  Session 1   Pt greeted sitting in wc and agreeable to OT treatment session focused on self-care retraining. Pt completed stand-pivot into shower with use of grab bars and CGA. Pt needed verbal cues for safety to sit down to step out of pants. L LE covered in water proof dressing. Bathing completed with CGA when standing to wash buttocks with verbal cues to sit down to wash R foot as pt attempted to stand and make figure 4 position. OT redirected pt to sitting and educated on fall risk and how standing on only LLE is too much weight through foot. Pt verbalized understanding. Dressing completed from wc at the sink with verbal cues for modified technique and CGA for balance when standing to pull up pants. LLE continues to slip out of cast shoe. OT placed non-skid sock with some improvement in keeping foot in shoe. Pt ambulated 50 feet and returned to bed at end of session. Pt left with bed alarm on, call bell in reach, and needs met.   Session 2 Pt greeted asleep in bed and agreeable to OT treatment session. Pt came to sitting EOB w/ supervision. Pt donned shoes including L cast shoe, then ambulated 10 feet to wc. Pt brought down to therapy gym and OT adapted cast shoe using elastic shoe lace to help decrease heel slipping out of shoe. Pt ambulated in gym w/ RW and CGA with shoe able to stay on foot. Pt returned to room and completed stand-pivot back to bed w/ CGA. Pt left semi-reclined in bed with bed alarm on and  needs met.   Therapy Documentation Precautions:  Precautions Precautions: Fall Required Braces or Orthoses: Other Brace Other Brace: fracture shoe on the left foot Restrictions Weight Bearing Restrictions: Yes LLE Weight Bearing: Weight bearing as tolerated Pain:  none   Therapy/Group: Individual Therapy  Valma Cava 05/31/2019, 3:03 PM

## 2019-06-01 ENCOUNTER — Inpatient Hospital Stay (HOSPITAL_COMMUNITY): Payer: Medicare Other

## 2019-06-01 NOTE — Progress Notes (Signed)
Physical Therapy Session Note  Patient Details  Name: [name]. MRN: [id number] Date of Birth: 1941/02/24  Today's Date: 06/01/2019 PT Individual Time: 1005-1102 PT Individual Time Calculation (min): 57 min   Short Term Goals: Week 1:  PT Short Term Goal 1 (Week 1): STG=LTG due to ELOS  Skilled Therapeutic Interventions/Progress Updates:    Pt supine in bed upon PT arrival, agreeable to therapy tx and denies pain. Pt transferred to sitting EOB with supervision. Seated EOB donned pants and shoes, sit<>stand with RW and CGA to pull shorts over hips. Pt ambulated from bed<>bathroom 2 x 10 ft with RW and min assist, continent of bladder and performed all clothing management without assist. Seated in w/c at sink to wash hands without assist. Pt ambulated x 120 ft to the gym using rollator and min assist, cues for safety and awareness. Pt worked on dynamic standing balance this session without AD to perform the following tasks: forward toe taps on aerobic step, lateral toe taps on aerobic step x 15 per LE, four square stepping in each direction, standing on airex while reaching outside BOS to perform horseshoes toss activity x 2 trials, forwards/backwards ambulation without AD 3 x 10 ft in each direction, sidestepping in each direction without AD 3 x 10 ft, all of these completed with min-mod assist for balance. Pt ambulated back to room with rollator and min assist, cues for upright posture. Pt transferred to w/c and left seated with needs in reach and chair alarm set.   Therapy Documentation Precautions:  Precautions Precautions: Fall Required Braces or Orthoses: Other Brace Other Brace: fracture shoe on the left foot Restrictions Weight Bearing Restrictions: Yes LLE Weight Bearing: Weight bearing as tolerated   Therapy/Group: Individual Therapy  Netta Corrigan, PT, DPT 06/01/2019, 7:47 AM

## 2019-06-01 NOTE — Progress Notes (Signed)
[place] PHYSICAL MEDICINE & REHABILITATION PROGRESS NOTE   Subjective/Complaints: Patient feels well.  He ate breakfast.  He does state that he is getting stronger with physical therapy.  He does admit that his legs are still "wobbly".   Objective:  Physical Exam: Vital Signs Blood pressure (!) 112/95, pulse (!) 55, temperature (!) 97.5 F (36.4 C), temperature source Oral, resp. rate 16, weight 82.6 kg, SpO2 96 %.    well-developed well-nourished male in no acute distress. HEENT exam atraumatic, normocephalic, neck supple without jugular venous distention. Chest clear to auscultation cardiac exam S1-S2 are regular. Abdominal exam mildly overweight with bowel sounds, soft and nontender. Extremities no edema. Neurologic exam is alert     Assessment/Plan: 1. 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 Antithrombotics: -DVT/anticoagulation:Mechanical:Sequential compression devices, below kneeBilateral lower extremities -antiplatelet therapy: ASA 3. Pain Management:tylenol prn 4. Mood:LCSW to follow for evaluation and support. -antipsychotic agents: N/A 5. Neuropsych: This patient is not fullycapable of making decisions on hisown behalf. 6. Skin/Wound Care:Routine pressure relief measures. 7. Fluids/Electrolytes/Nutrition:MOnitor I/o.   BMP Latest Ref Rng & Units 05/29/2019 05/27/2019 05/26/2019  Glucose 70 - 99 mg/dL 91 87 99  BUN 8 - 23 mg/dL 32(H) 24(H) 26(H)  Creatinine 0.61 - 1.24 mg/dL 1.38(H) 1.22 1.26(H)  BUN/Creat Ratio 10 - 24 - - -  Sodium 135 - 145 mmol/L 140 141 139  Potassium 3.5 - 5.1 mmol/L 4.6 4.6 4.4  Chloride 98 - 111 mmol/L 104 108 106  CO2 22 - 32 mmol/L 28 25 25   Calcium 8.9 - 10.3 mg/dL 9.4 9.7 9.5     8.Left great toe amputation:Monitor for healing. 9. CAD s/p PPM/CAF: Continue current  medications. 10. Pulmonary fibrosis/OSA: Continue to encourage use of CPAP when sleeping. 11. T2DM-diet controlled: Hgb A 1c- 5.6. no need tocheck cbg 12. Lewy body dementia?:   Will need follow-up as outpatient 13. [name] with pancytopenia/severe peripheral neuropathy:Monitor platelets CBC: CBC Latest Ref Rng & Units 05/29/2019 05/27/2019 05/26/2019  WBC 4.0 - 10.5 K/uL 6.4 5.2 5.5  Hemoglobin 13.0 - 17.0 g/dL 11.2(L) 11.7(L) 10.2(L)  Hematocrit 39.0 - 52.0 % 35.7(L) 36.9(L) 32.6(L)  Platelets 150 - 400 K/uL 124(L) 90(L) 87(L)   14. H/p BPH: continue on flomax 15. Acute on chronic systolic and diastolic heart failure: Monitor daily weights.    Filed Weights   05/30/19 0550 05/31/19 0558 06/01/19 0524  Weight: 84.2 kg 84.2 kg 82.6 kg    LOS: 6 days A FACE TO FACE EVALUATION WAS PERFORMED  [name] 06/01/2019, 6:49 AM

## 2019-06-02 ENCOUNTER — Inpatient Hospital Stay (HOSPITAL_COMMUNITY): Payer: Medicare Other | Admitting: Occupational Therapy

## 2019-06-02 ENCOUNTER — Inpatient Hospital Stay (HOSPITAL_COMMUNITY): Payer: Medicare Other

## 2019-06-02 DIAGNOSIS — Z950 Presence of cardiac pacemaker: Secondary | ICD-10-CM

## 2019-06-02 DIAGNOSIS — I50813 Acute on chronic right heart failure: Secondary | ICD-10-CM

## 2019-06-02 DIAGNOSIS — I480 Paroxysmal atrial fibrillation: Secondary | ICD-10-CM

## 2019-06-02 DIAGNOSIS — N170 Acute kidney failure with tubular necrosis: Secondary | ICD-10-CM

## 2019-06-02 DIAGNOSIS — I5033 Acute on chronic diastolic (congestive) heart failure: Secondary | ICD-10-CM

## 2019-06-02 DIAGNOSIS — I25119 Atherosclerotic heart disease of native coronary artery with unspecified angina pectoris: Secondary | ICD-10-CM

## 2019-06-02 LAB — BASIC METABOLIC PANEL
Anion gap: 8 (ref 5–15)
BUN: 40 mg/dL — ABNORMAL HIGH (ref 8–23)
CO2: 29 mmol/L (ref 22–32)
Calcium: 9.4 mg/dL (ref 8.9–10.3)
Chloride: 103 mmol/L (ref 98–111)
Creatinine, Ser: 1.62 mg/dL — ABNORMAL HIGH (ref 0.61–1.24)
GFR calc Af Amer: 47 mL/min — ABNORMAL LOW (ref >60)
GFR calc non Af Amer: 41 mL/min — ABNORMAL LOW (ref >60)
Glucose, Bld: 91 mg/dL (ref 70–99)
Potassium: 4.1 mmol/L (ref 3.5–5.1)
Sodium: 140 mmol/L (ref 135–145)

## 2019-06-02 MED ORDER — FUROSEMIDE 40 MG PO TABS
40.0000 mg | ORAL_TABLET | Freq: Two times a day (BID) | ORAL | Status: DC
  Administered 2019-06-03 – 2019-06-04 (×2): 40 mg via ORAL
  Filled 2019-06-02 (×2): qty 1

## 2019-06-02 NOTE — Progress Notes (Signed)
Social Work Patient ID: [name]., male   DOB: 1941/11/02, 78 y.o.   MRN: [id number]   CSW met with pt 05-30-19 and spoke with wife via telephone to update them on team conference discussion and targeted d/c date of 06-06-19.  Then, today, pt's team feels pt can actually d/c on 06-04-19.  Pt's wife was pleased and she and her dtr will come for family education tomorrow in preparation of d/c.  CSW will continue to follow and assist as needed.

## 2019-06-02 NOTE — Progress Notes (Signed)
[place] PHYSICAL MEDICINE & REHABILITATION PROGRESS NOTE   Subjective/Complaints:  No issues overnite, sup with showering per OT today  ROS- no CP, SOB, N/V?D   Objective:   No results found. No results for input(s): WBC, HGB, HCT, PLT in the last 72 hours. No results for input(s): NA, K, CL, CO2, GLUCOSE, BUN, CREATININE, CALCIUM in the last 72 hours.  Intake/Output Summary (Last 24 hours) at 06/02/2019 0805 Last data filed at 06/02/2019 0352 Gross per 24 hour  Intake 604 ml  Output 1025 ml  Net -421 ml     Physical Exam: Vital Signs Blood pressure 99/62, pulse (!) 54, temperature 97.7 F (36.5 C), temperature source Oral, resp. rate 19, weight 82.7 kg, SpO2 99 %.   General: No acute distress Mood and affect are appropriate Oriented x 3  Heart: Regular rate and rhythm no rubs murmurs or extra sounds Lungs: Clear to auscultation, breathing unlabored, no rales or wheezes Abdomen: Positive bowel sounds, soft nontender to palpation, nondistended Extremities: No clubbing, cyanosis, or edema Skin: No evidence of breakdown, Left foot bulky dressing, + stasis dermatitis Neurologic: Cranial nerves II through XII intact, motor strength is 4/5 in bilateral deltoid, bicep, tricep, grip, hip flexor, knee extensors, ankle dorsiflexor and plantar flexor  Cerebellar exam normalmild intention tremor in BUE otherwise WNL     Musculoskeletal: Full range of motion in all 4 extremities. No joint swelling    Assessment/Plan: 1. 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  Physiatrist is providing close team supervision and 24 hour management of active medical problems listed below.  Physiatrist and rehab team continue to assess barriers to discharge/monitor patient progress toward functional and medical goals  Care Tool:  Bathing    Body parts bathed by patient: Right  arm, Left arm, Chest, Abdomen, Front perineal area, Buttocks, Left upper leg, Right upper leg, Face, Right lower leg, Left lower leg         Bathing assist Assist Level: Minimal Assistance - Patient > 75%     Upper Body Dressing/Undressing Upper body dressing   What is the patient wearing?: Pull over shirt    Upper body assist Assist Level: Supervision/Verbal cueing    Lower Body Dressing/Undressing Lower body dressing      What is the patient wearing?: Pants     Lower body assist Assist for lower body dressing: Minimal Assistance - Patient > 75%     Toileting Toileting    Toileting assist Assist for toileting: Contact Guard/Touching assist     Transfers Chair/bed transfer  Transfers assist     Chair/bed transfer assist level: Contact Guard/Touching assist     Locomotion Ambulation   Ambulation assist      Assist level: Minimal Assistance - Patient > 75% Assistive device: Rollator Max distance: 154ft   Walk 10 feet activity   Assist     Assist level: Minimal Assistance - Patient > 75% Assistive device: Rollator   Walk 50 feet activity   Assist    Assist level: Minimal Assistance - Patient > 75% Assistive device: Rollator    Walk 150 feet activity   Assist    Assist level: Minimal Assistance - Patient > 75% Assistive device: Rollator    Walk 10 feet on uneven surface  activity   Assist     Assist level: Minimal Assistance - Patient > 75%     Wheelchair     Assist  Will patient use wheelchair at discharge?: No(TBD but do not anticipate will need) Type of Wheelchair: Manual    Wheelchair assist level: Supervision/Verbal cueing Max wheelchair distance: 150 ft    Wheelchair 50 feet with 2 turns activity    Assist        Assist Level: Supervision/Verbal cueing   Wheelchair 150 feet activity     Assist     Assist Level: Supervision/Verbal cueing    Medical Problem List and Plan: 1.Functional  deficitssecondary to debility and brainstem CVA Continue CIR PT OT ELOS 6/19 may be able to move up 2. Antithrombotics: -DVT/anticoagulation:Mechanical:Sequential compression devices, below kneeBilateral lower extremities -antiplatelet therapy: ASA 3. Pain Management:tylenol prn 4. Mood:LCSW to follow for evaluation and support. -antipsychotic agents: N/A 5. Neuropsych: This patient is not fullycapable of making decisions on hisown behalf. 6. Skin/Wound Care:Routine pressure relief measures. 7. Fluids/Electrolytes/Nutrition:MOnitor I/o.  Weights going down, BUN creatinine rising, will reduce Lasix to 40 mg twice daily expect BUN to correct recheck BMET today  8.Left great toe amputation:Monitor for healing. 9. CAD s/p PPM/CAF: Monitor HR tid--continue ASA and Crestor. Metoprolol resumed on 6/8--monitor for recurrent bradycardia. 10. Pulmonary fibrosis/OSA: Continue to encourage use of CPAP when sleeping. 11. T2DM-diet controlled: Hgb A 1c- 5.6. Will monitor BS ac/hs and use SSI for elevated BS.  No CBG needed 12. Lewy body dementia?: Hallucinations with STM deficits and falls per notes. Neurology following with work up underway. Avoid neuroleptics  13. [name] with pancytopenia/severe peripheral neuropathy:Monitor platelets 14. H/p BPH: continue on flomax 15. Acute on chronic systolic and diastolic heart failure: Monitor daily weights. Strict I/O--1200 cc FR.Continue Crestor--lasix resumed 6/8. -Will check orthostatic vitals as reporting dizziness with positional changes. -daily weights- trending down, slowly  6/15   [facility] Weights   05/31/19 0558 06/01/19 0524 06/02/19 0351  Weight: 84.2 kg 82.6 kg 82.7 kg    LOS: 7 days A FACE TO FACE EVALUATION WAS PERFORMED  Luanna Salk Kirsteins 06/02/2019, 8:05 AM

## 2019-06-02 NOTE — Progress Notes (Signed)
Physical Therapy Session Note  Patient Details  Name: [name]. MRN: [id number] Date of Birth: 01-12-1941  Today's Date: 06/02/2019 PT Individual Time: 1031-1130 and 1430-1515 PT Individual Time Calculation (min): 59 min and 45 min  Short Term Goals: Week 1:  PT Short Term Goal 1 (Week 1): STG=LTG due to ELOS  Skilled Therapeutic Interventions/Progress Updates:    Session 1: Pt seated in w/c upon PT arrival, agreeable to therapy tx and denies pain. Pt performed sit<>stand with rollator and CGA, ambulated over to his suitecase in the room x 10 ft with rollator and CGA, pt reports he thinks someone took tickets out of his wallet, he reports this has already been reported to the director. Pt ambulated x 120 ft to the dayroom with rollator and CGA. Pt used nustep this session x 6 minutes for global strengthening and endurance on workload 5. Pt ambulated to the gym x 150 ft with rollator and CGA. Pt worked on dynamic standing balance this session without AD to perform the following: toe taps on aerobic step, forwards/backwards ambulation (LOB during backwards requiring mod assist to correct), sidestepping 2 x 30 ft in each direction, standing on airex with feet together, standing on airex while therapist applied manual perturbations, standing on airex with eyes closed, standing on red wedge while completing peg board puzzle. Pt worked on LE strengthening and balance to perform 2 x 10 sit<>stands without UE support, cues for techniques and CGA. Pt ambulated to the steps and ascended/descended 4 steps with B rails and then ascended/descended 4 steps with single rail, min assist and cues for step to technique. Pt ambulated back to room with rollator and CGA, left seated in w/c with needs in reach and chair alarm set.   Session 2: Pt seated in w/c upon PT arrival, agreeable to therapy tx and denies pain. Pt ambulated to the gym with rollator and CGA x 150 ft. Pt ambulated 2 x 100 ft this session from  gym<>rehab apartment with rollator and CGA, pt performed furniture transfer this session on/off recliner with CGA. Pt worked on dynamic standing balance this session without UE support to complete the following: standing on airex with feet together while completing puzzle, gait without AD 2 x 50 ft, sidestepping 3 x 10 ft in each direction, toe taps to colored cones x 2 trials, all with min assist for balance. Pt ambulated back to room with rollator and CGA, transferred to bed and transferred to supine with supervision. Pt left supine in bed with needs in reach and chair alarm set.   Therapy Documentation Precautions:  Precautions Precautions: Fall Required Braces or Orthoses: Other Brace Other Brace: fracture shoe on the left foot Restrictions Weight Bearing Restrictions: Yes LLE Weight Bearing: Weight bearing as tolerated    Therapy/Group: Individual Therapy  Netta Corrigan, PT, DPT 06/02/2019, 7:48 AM

## 2019-06-02 NOTE — Progress Notes (Signed)
Occupational Therapy Session Note  Patient Details  Name: [name]. MRN: [id number] Date of Birth: May 22, 1941  Today's Date: 06/02/2019 OT Individual Time: 6606-0045 OT Individual Time Calculation (min): 75 min    Short Term Goals: Week 1:  OT Short Term Goal 1 (Week 1): Short term goals equal to LTGs set at supervision level.  Skilled Therapeutic Interventions/Progress Updates:    Pt completed bathing and dressing sit to stand during session.  Rollator used for all functional mobility in the room with min guard assist.  He demonstrated two LOBs during standing, when attempting to remove his shirt and when pulling up pants.  Both instances were when he was not holding onto anything.  He was able to regain his balance without therapist assist but with with assist of the rollator.  Min instructional cueing to remain sitting when doffing and donning clothing as well as when drying off.  Min guard assist for all bathing and dressing overall from the EOB.  He was able to stand at the sink to comb his hair and complete shaving with min guard assist as well.  Finished session with pt in the wheelchair with chair alarm set and call button and phone in reach.    Therapy Documentation Precautions:  Precautions Precautions: Fall Required Braces or Orthoses: Other Brace Other Brace: fracture shoe on the left foot Restrictions Weight Bearing Restrictions: Yes LLE Weight Bearing: Weight bearing as tolerated   Pain: Pain Assessment Pain Scale: Faces Pain Score: 0-No pain ADL: See Care Tool for some details of ADL  Therapy/Group: Individual Therapy  Kelia Gibbon OTR/L 06/02/2019, 9:38 AM

## 2019-06-02 NOTE — Progress Notes (Signed)
Occupational Therapy Session Note  Patient Details  Name: [name]. MRN: [id number] Date of Birth: January 28, 1941  Today's Date: 06/02/2019 OT Individual Time: 6712-4580 OT Individual Time Calculation (min): 63 min    Short Term Goals: Week 1:  OT Short Term Goal 1 (Week 1): Short term goals equal to LTGs set at supervision level.  Skilled Therapeutic Interventions/Progress Updates:    Pt completed functional mobility down to the ADL apartment at supervision level with use of the rollator, where he practiced walk-in shower transfers with use of the simulated shower edge.  Close supervision for posterior transfer into the shower and anterior transfer out of the shower.  Discussed use of the 3:1 in the shower as the built in seat is too far back to access the hand controls and shower head.  Also educated pt on need for hand held shower as well.  He then ambulated to the gym with close supervision using the rollator.  Had pt transfer to the therapy mat and complete performance of the [employer].  He was able to score 41/56 which is still indicative of a moderate balance deficit and need for a walker during mobility.  Returned to the room at end of session with call button and phone in reach with safety alarm pad in place.  Pt's spouse planning to come in in the am for therapy in preparation for discharge on 6/17.    Therapy Documentation Precautions:  Precautions Precautions: Fall Required Braces or Orthoses: Other Brace Other Brace: fracture shoe on the left foot Restrictions Weight Bearing Restrictions: Yes LLE Weight Bearing: Weight bearing as tolerated  Pain: Pain Assessment Pain Scale: Faces Pain Score: 0-No pain    Therapy/Group: Individual Therapy  Maciej Schweitzer OTR/L 06/02/2019, 1:52 PM

## 2019-06-02 NOTE — Progress Notes (Signed)
Progress Note  Patient Name: [name]. Date of Encounter: 06/02/2019  Primary Cardiologist: Kirk Ruths, MD   Subjective   Actually feels much better now.  No PND orthopnea.  Edema seems to be well controlled.  Foot pain is improved.  Neurologically seems stable.  We were reconsulted to follow-up based on altered renal function and determining discharge medications.  Inpatient Medications    Scheduled Meds:  allopurinol  300 mg Oral Daily   aspirin EC  81 mg Oral Daily   docusate sodium  100 mg Oral BID   ferrous sulfate  325 mg Oral Q breakfast   [START ON 06/03/2019] furosemide  40 mg Oral BID   gabapentin  300 mg Oral TID   hydrocerin   Topical BID   metoprolol tartrate  50 mg Oral BID   mirabegron ER  50 mg Oral Daily   pantoprazole  40 mg Oral Daily   potassium chloride SA  20 mEq Oral Daily   rosuvastatin  40 mg Oral Daily   sertraline  200 mg Oral Daily   tamsulosin  0.4 mg Oral QHS   Continuous Infusions:  PRN Meds: acetaminophen, alum & mag hydroxide-simeth, bisacodyl, diphenhydrAMINE, guaiFENesin-dextromethorphan, polyethylene glycol, prochlorperazine **OR** prochlorperazine **OR** prochlorperazine, sodium phosphate, traMADol, traZODone   Vital Signs    Vitals:   06/01/19 2010 06/02/19 0351 06/02/19 1400 06/02/19 1941  BP: 92/62 99/62 90/61  108/82  Pulse: (!) 52 (!) 54 (!) 54 75  Resp: 18 19 14 16   Temp:  97.7 F (36.5 C) 97.6 F (36.4 C) 98.9 F (37.2 C)  TempSrc:  Oral Oral   SpO2: 96% 99% 100% 100%  Weight:  82.7 kg      Intake/Output Summary (Last 24 hours) at 06/02/2019 2010 Last data filed at 06/02/2019 1800 Gross per 24 hour  Intake 240 ml  Output 1925 ml  Net -1685 ml   Filed Weights   05/31/19 0558 06/01/19 0524 06/02/19 0351  Weight: 84.2 kg 82.6 kg 82.7 kg    Telemetry    Not currently on telemetry- Personally Reviewed  ECG    none - Personally Reviewed  Physical Exam   GEN: No acute distress.     Neck: No JVD Cardiac: RRR, no murmurs, rubs, or gallops.  Respiratory: Clear to auscultation bilaterally. GI: Soft, nontender, non-distended  MS: No edema; No deformity. Chronic venous stasis changes up to the knee Neuro:  Nonfocal  Psych: Normal affect   Labs    Chemistry Recent Labs  Lab 05/27/19 0517 05/29/19 0552 06/02/19 0752  NA 141 140 140  K 4.6 4.6 4.1  CL 108 104 103  CO2 25 28 29   GLUCOSE 87 91 91  BUN 24* 32* 40*  CREATININE 1.22 1.38* 1.62*  CALCIUM 9.7 9.4 9.4  PROT 7.7  --   --   ALBUMIN 3.5  --   --   AST 38  --   --   ALT 23  --   --   ALKPHOS 80  --   --   BILITOT 0.9  --   --   GFRNONAA 57* 49* 41*  GFRAA >60 57* 47*  ANIONGAP 8 8 8      Hematology Recent Labs  Lab 05/27/19 0517 05/29/19 0552  WBC 5.2 6.4  RBC 4.08* 4.02*  HGB 11.7* 11.2*  HCT 36.9* 35.7*  MCV 90.4 88.8  MCH 28.7 27.9  MCHC 31.7 31.4  RDW 15.7* 15.2  PLT 90* 124*  Cardiac Enzymes No results for input(s): TROPONINI in the last 168 hours. No results for input(s): TROPIPOC in the last 168 hours.   BNP No results for input(s): BNP, PROBNP in the last 168 hours.   DDimer No results for input(s): DDIMER in the last 168 hours.   Radiology    No results found.  Cardiac Studies    2D Echo May 17, 2019: Normal LV size with low normal function.  EF 50 to 55% with septal hypokinesis.  (Expect pacemaker related pacemaker rounds).  RV was mildly dilated.  Severe TR (suggestive of pulmonary hypertension -thought to be related to pulmonary venous congestion/right-sided heart failure) despite normal IV size.  Patient Profile     78 y.o. 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  He was initially admitted with predominantly right greater than left sided heart failure.  But was also noted to have left foot cellulitis.  Was found to have significant reduced ABIs and underwent left fourth toe amputation  on May 17, 2019. Postoperatively he developed transient diplopia and thought to potentially have had brainstem infarct versus TIA. --Despite having A. 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    Assessment & Plan    1. Right heart > Left Heart failure -during index hospitalization was diuresed over 30 pounds and was recommended to be on 80 mg twice daily Lasix.  Prior to transfer to rehab, temporarily had diuretic and beta-blocker held for bradycardia and worsening renal function.  Now back on Lasix with increasing creatinine.  Primary team is written for Lasix 40 mg p.o. twice daily --I have written to hold tonight and tomorrow morning's dose of Lasix.   -- Reevaluate chemistries in the morning -depending on the creatinine trend, will then be able to potentially set home discharge dose to 80 mg once daily in the morning with an additional 40 mg the afternoon as necessary..  --Suspect right heart failure from combination of pulmonary fibrosis and portal venous congestion --> may potentially lead to some congestive hepatopathy related cirrhosis.  --> With normal EF, twice daily metoprolol is reasonable.  2. PPM - he has reached ERI.  --Per Dr. Lovena Le, recommendation was PPM GEN change short at discharge.  Would like to have a least 2 weeks out of the hospital.  We will set up EP follow-up on discharge .  3.  Known CAD: Remains on aspirin and Crestor as well as beta-blocker with relatively stable heart rate now.  4.  Has had a history of PAF on device interrogation.  Not DOAC candidate for reasons of prior subdural hematoma.    For questions or updates, please contact [place] Please consult [email or web address] for contact info under Cardiology/STEMI.   Signed, Glenetta Hew, MD  06/02/2019, 8:10 PM  Patient ID: Jamie Singleton., male   DOB: April 02, 1941, 78 y.o.   MRN: [id number]

## 2019-06-02 NOTE — Progress Notes (Signed)
Patient stated the he would place his CPAP on himself when he is ready to go to sleep. RT informed patient to have RT called if assistance is needed. RT will monitor as needed.

## 2019-06-03 ENCOUNTER — Inpatient Hospital Stay (HOSPITAL_COMMUNITY): Payer: Medicare Other

## 2019-06-03 ENCOUNTER — Inpatient Hospital Stay (HOSPITAL_COMMUNITY): Payer: Medicare Other | Admitting: Occupational Therapy

## 2019-06-03 ENCOUNTER — Inpatient Hospital Stay (HOSPITAL_COMMUNITY): Payer: Medicare Other | Admitting: Physical Therapy

## 2019-06-03 ENCOUNTER — Ambulatory Visit (HOSPITAL_COMMUNITY): Payer: Medicare Other

## 2019-06-03 ENCOUNTER — Encounter (HOSPITAL_COMMUNITY): Payer: Medicare Other | Admitting: Occupational Therapy

## 2019-06-03 DIAGNOSIS — I5032 Chronic diastolic (congestive) heart failure: Secondary | ICD-10-CM | POA: Diagnosis present

## 2019-06-03 DIAGNOSIS — Z951 Presence of aortocoronary bypass graft: Secondary | ICD-10-CM

## 2019-06-03 DIAGNOSIS — I2510 Atherosclerotic heart disease of native coronary artery without angina pectoris: Secondary | ICD-10-CM | POA: Diagnosis present

## 2019-06-03 LAB — BASIC METABOLIC PANEL
Anion gap: 9 (ref 5–15)
BUN: 36 mg/dL — ABNORMAL HIGH (ref 8–23)
CO2: 28 mmol/L (ref 22–32)
Calcium: 9.5 mg/dL (ref 8.9–10.3)
Chloride: 104 mmol/L (ref 98–111)
Creatinine, Ser: 1.53 mg/dL — ABNORMAL HIGH (ref 0.61–1.24)
GFR calc Af Amer: 50 mL/min — ABNORMAL LOW (ref >60)
GFR calc non Af Amer: 44 mL/min — ABNORMAL LOW (ref >60)
Glucose, Bld: 137 mg/dL — ABNORMAL HIGH (ref 70–99)
Potassium: 4 mmol/L (ref 3.5–5.1)
Sodium: 141 mmol/L (ref 135–145)

## 2019-06-03 NOTE — Progress Notes (Addendum)
[facility] PHYSICAL MEDICINE & REHABILITATION PROGRESS NOTE   Subjective/Complaints:  [place] cardiology consult, EKG is pending.  Lasix is being held with plans for resumption at 40 mg twice daily tonight  ROS- no CP, SOB, N/V?D   Objective:   No results found. No results for input(s): WBC, HGB, HCT, PLT in the last 72 hours. Recent Labs    06/02/19 0752  NA 140  K 4.1  CL 103  CO2 29  GLUCOSE 91  BUN 40*  CREATININE 1.62*  CALCIUM 9.4    Intake/Output Summary (Last 24 hours) at 06/03/2019 0900 Last data filed at 06/03/2019 0744 Gross per 24 hour  Intake 596 ml  Output 1675 ml  Net -1079 ml     Physical Exam: Vital Signs Blood pressure (!) 118/98, pulse 100, temperature 98.2 F (36.8 C), temperature source Oral, resp. rate 16, weight 83.1 kg, SpO2 98 %.   General: No acute distress Mood and affect are appropriate Oriented x 3  Heart: Regular rate and rhythm no rubs murmurs or extra sounds Lungs: Clear to auscultation, breathing unlabored, no rales or wheezes Abdomen: Positive bowel sounds, soft nontender to palpation, nondistended Extremities: No clubbing, cyanosis, or edema Skin: No evidence of breakdown, Left foot bulky dressing, + stasis dermatitis Neurologic: Cranial nerves II through XII intact, motor strength is 4/5 in bilateral deltoid, bicep, tricep, grip, hip flexor, knee extensors, ankle dorsiflexor and plantar flexor  Cerebellar exam normalmild intention tremor in BUE otherwise WNL     Musculoskeletal: Full range of motion in all 4 extremities. No joint swelling    Assessment/Plan: 1. 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  Physiatrist is providing close team supervision and 24 hour management of active medical problems listed below.  Physiatrist and rehab team continue to assess barriers to discharge/monitor patient progress  toward functional and medical goals  Care Tool:  Bathing    Body parts bathed by patient: Right arm, Left arm, Chest, Abdomen, Front perineal area, Buttocks, Left upper leg, Right upper leg, Face, Right lower leg, Left lower leg         Bathing assist Assist Level: Contact Guard/Touching assist     Upper Body Dressing/Undressing Upper body dressing   What is the patient wearing?: Pull over shirt    Upper body assist Assist Level: Set up assist    Lower Body Dressing/Undressing Lower body dressing      What is the patient wearing?: Underwear/pull up     Lower body assist Assist for lower body dressing: Contact Guard/Touching assist     Toileting Toileting    Toileting assist Assist for toileting: Contact Guard/Touching assist     Transfers Chair/bed transfer  Transfers assist     Chair/bed transfer assist level: Contact Guard/Touching assist     Locomotion Ambulation   Ambulation assist      Assist level: Contact Guard/Touching assist Assistive device: Rollator Max distance: 150 ft   Walk 10 feet activity   Assist     Assist level: Contact Guard/Touching assist Assistive device: Rollator   Walk 50 feet activity   Assist    Assist level: Contact Guard/Touching assist Assistive device: Rollator    Walk 150 feet activity   Assist    Assist level: Contact Guard/Touching assist Assistive device: Rollator    Walk 10 feet on uneven surface  activity   Assist     Assist level: Minimal Assistance -  Patient > 75%     Wheelchair     Assist Will patient use wheelchair at discharge?: No(TBD but do not anticipate will need) Type of Wheelchair: Manual    Wheelchair assist level: Supervision/Verbal cueing Max wheelchair distance: 150 ft    Wheelchair 50 feet with 2 turns activity    Assist        Assist Level: Supervision/Verbal cueing   Wheelchair 150 feet activity     Assist     Assist Level:  Supervision/Verbal cueing    Medical Problem List and Plan: 1.Functional deficitssecondary to debility and brainstem CVA Continue CIR PT OT ELOS 6/19 may be able to move up to 6/17 if okay with cardiology 2. Antithrombotics: -DVT/anticoagulation:Mechanical:Sequential compression devices, below kneeBilateral lower extremities -antiplatelet therapy: ASA 3. Pain Management:tylenol prn 4. Mood:LCSW to follow for evaluation and support. -antipsychotic agents: N/A 5. Neuropsych: This patient is not fullycapable of making decisions on hisown behalf. 6. Skin/Wound Care:Routine pressure relief measures. 7. Fluids/Electrolytes/Nutrition:MOnitor I/o.  Weights going down, BUN creatinine rising, will reduce Lasix to 40 mg twice daily expect BUN to correct recheck BMET today  8.Left great toe amputation:Monitor for healing. 9. CAD s/p PPM/CAF: Monitor HR tid--continue ASA and Crestor. Metoprolol resumed on 6/8--monitor for recurrent bradycardia. 10. Pulmonary fibrosis/OSA: Continue to encourage use of CPAP when sleeping. 11. T2DM-diet controlled: Hgb A 1c- 5.6. Will monitor BS ac/hs and use SSI for elevated BS.  No CBG needed 12. Lewy body dementia?: Hallucinations with STM deficits and falls per notes. Neurology following with work up underway. Avoid neuroleptics  13. [name] with pancytopenia/severe peripheral neuropathy:Monitor platelets 14. H/p BPH: continue on flomax 15. Acute on chronic systolic and diastolic heart failure: Monitor daily weights. Strict I/O--1200 cc FR.Continue Crestor--lasix resumed 6/8. -Will check orthostatic vitals as reporting dizziness with positional changes. -daily weights- trending down, slowly  6/15   [facility] Weights   06/01/19 0524 06/02/19 0351 06/03/19 0550  Weight: 82.6 kg 82.7 kg 83.1 kg    LOS: 8 days A FACE TO FACE EVALUATION WAS PERFORMED  Luanna Salk Aster Eckrich 06/03/2019, 9:00 AM

## 2019-06-03 NOTE — Progress Notes (Signed)
Patient places self on CPAP when ready. RT informed patient to have RT called if assistance is needed. RT will monitor.

## 2019-06-03 NOTE — Progress Notes (Signed)
Physical Therapy Discharge Summary  Patient Details  Name: Hazen Brumett. MRN: [id number] Date of Birth: 1941-09-12  Patient has met 8 of 8 long term goals due to improved activity tolerance, improved balance, increased strength, improved attention and improved awareness.  Patient to discharge at an ambulatory level Supervision.   Patient's care partner is independent to provide the necessary supervision assistance at discharge. Pt's wife and daughter have attended and completed family education, they are both able to provide supervision-CGA needed for mobility and cues for safety and device use.   All goals met.  Recommendation:  Patient will benefit from ongoing skilled PT services in home health to continue to advance safe functional mobility, address ongoing impairments in balance, strength, and minimize fall risk.  Equipment: No equipment provided  Reasons for discharge: treatment goals met  Patient/family agrees with progress made and goals achieved: Yes  PT Discharge Precautions/Restrictions Precautions Precautions: Fall Other Brace: fracture shoe on the left foot Restrictions Weight Bearing Restrictions: Yes LLE Weight Bearing: Weight bearing as tolerated Vital Signs Therapy Vitals Temp: 98.2 F (36.8 C) Temp Source: Oral Pulse Rate: 100 Resp: 16 BP: (!) 118/98 Patient Position (if appropriate): Lying Oxygen Therapy SpO2: 98 % O2 Device: Room Air Pain  denies pain during therapy session Cognition Overall Cognitive Status: History of cognitive impairments - at baseline Arousal/Alertness: Awake/alert Orientation Level: Oriented X4 Attention: Sustained Sustained Attention: Appears intact Safety/Judgment: Appears intact Sensation Sensation Light Touch: Impaired Detail(impaired detail B feet, stocking distribution) Peripheral sensation comments: hx of peripheral neuropathy Proprioception: Appears Intact Additional Comments: sensation WFL, Decreased  sensation in feet secondary to peripheral neuropathy Coordination Gross Motor Movements are Fluid and Coordinated: Yes Fine Motor Movements are Fluid and Coordinated: Yes Motor  Motor Motor: Within Functional Limits  Mobility Bed Mobility Bed Mobility: Rolling Right;Rolling Left;Supine to Sit;Sit to Supine Rolling Right: Supervision/verbal cueing Rolling Left: Supervision/Verbal cueing Supine to Sit: Supervision/Verbal cueing Sit to Supine: Supervision/Verbal cueing Transfers Transfers: Sit to Stand;Stand Pivot Transfers Sit to Stand: Supervision/Verbal cueing Stand Pivot Transfers: Supervision/Verbal cueing Stand Pivot Transfer Details: Verbal cues for precautions/safety;Verbal cues for safe use of DME/AE Transfer (Assistive device): Rollator Locomotion  Gait Ambulation: Yes Gait Assistance: Supervision/Verbal cueing Gait Distance (Feet): 150 Feet Assistive device: Rollator Gait Assistance Details: Verbal cues for sequencing;Verbal cues for precautions/safety;Verbal cues for safe use of DME/AE Gait Gait: Yes Gait Pattern: Decreased step length - right;Decreased step length - left;Trunk flexed Gait velocity: decreased Stairs / Additional Locomotion Stairs: Yes Stairs Assistance: Supervision/Verbal cueing Stair Management Technique: Two rails Number of Stairs: 12 Height of Stairs: 6 Wheelchair Mobility Wheelchair Mobility: No  Trunk/Postural Assessment  Cervical Assessment Cervical Assessment: Exceptions to WFL(forward head posture) Thoracic Assessment Thoracic Assessment: Exceptions to WFL(kyphotic) Lumbar Assessment Lumbar Assessment: Exceptions to WFL(posteior pelvic tilt) Postural Control Postural Control: Within Functional Limits  Balance Balance Balance Assessed: Yes Static Sitting Balance Static Sitting - Balance Support: Feet supported Static Sitting - Level of Assistance: 5: Stand by assistance Dynamic Sitting Balance Dynamic Sitting - Balance Support:  During functional activity Dynamic Sitting - Level of Assistance: 5: Stand by assistance Static Standing Balance Static Standing - Balance Support: During functional activity;Bilateral upper extremity supported Static Standing - Level of Assistance: 5: Stand by assistance Dynamic Standing Balance Dynamic Standing - Balance Support: During functional activity;Bilateral upper extremity supported Dynamic Standing - Level of Assistance: 5: Stand by assistance  Per OT note on 6/15 Pt participated in [place] with the below results: Standardized Balance Assessment  Standardized Balance Assessment: Berg Balance Test Berg Balance Test Sit to Stand: Able to stand without using hands and stabilize independently Standing Unsupported: Able to stand 2 minutes with supervision Sitting with Back Unsupported but Feet Supported on Floor or Stool: Able to sit safely and securely 2 minutes Stand to Sit: Sits safely with minimal use of hands Transfers: Able to transfer safely, definite need of hands Standing Unsupported with Eyes Closed: Able to stand 10 seconds safely Standing Ubsupported with Feet Together: Able to place feet together independently and stand 1 minute safely From Standing, Reach Forward with Outstretched Arm: Can reach forward >12 cm safely (5") From Standing Position, Pick up Object from Floor: Able to pick up shoe, needs supervision From Standing Position, Turn to Look Behind Over each Shoulder: Looks behind one side only/other side shows less weight shift Turn 360 Degrees: Able to turn 360 degrees safely one side only in 4 seconds or less Standing Unsupported, Alternately Place Feet on Step/Stool: Able to stand independently and complete 8 steps >20 seconds Standing Unsupported, One Foot in Front: Loses balance while stepping or standing Standing on One Leg: Unable to try or needs assist to prevent fall Total Score: 41/56 Extremity Assessment  RLE Assessment RLE Assessment:  Within Functional Limits Passive Range of Motion (PROM) Comments: tight hamstrings, heelcords General Strength Comments: grossly 4/5 throughout LLE Assessment LLE Assessment: Within Functional Limits Passive Range of Motion (PROM) Comments: tight hamstrings, heelcords General Strength Comments: grossly 4/5 throughout    Netta Corrigan, PT, DPT Page Spiro, PT, DPT 06/03/19  8:02 AM

## 2019-06-03 NOTE — Progress Notes (Addendum)
Occupational Therapy Discharge Summary  Patient Details  Name: [name]. MRN: [id number] Date of Birth: [birth year]  Today's Date: 06/03/2019 OT Individual Time: 1003-1104 OT Individual Time Calculation (min): 61 min   Session Note:  Pt worked on toileting, bathing, dressing, and grooming tasks with family present during session.  He was able to complete all aspects of bathing with supervision and then dressing at the same level, both sit to stand.  He was able to complete grooming tasks with supervision standing at the sink as well.  Educated pt's spouse and daughter on the need for pt to have someone right beside of him when he is completing sit to stand or with functional transfers.  They voiced understanding.  Also discussed and demonstrated appropriate technique for completion of walk-in shower transfers, stepping in posteriorly with use of the 3:1 for a seat.  I also recommended use of a hand held shower head as well.  Finished session with pt at the sink working on brushing his teeth with PT in for next session of treatment and education.    Patient has met 10 of 10 long term goals due to improved activity tolerance, improved balance, postural control and ability to compensate for deficits.  Patient to discharge at overall Supervision level.  Patient's care partner is independent to provide the necessary physical and cognitive assistance at discharge.    Reasons goals not met: NA  Recommendation:  Patient will benefit from ongoing skilled OT services in home health setting to continue to advance functional skills in the area of BADL.  Feel pt will benefit from continued [place] eval for safety and treatment in order to assess functional performance in a familiar environment as well as working on progressing to modified independent level.    Equipment: 3:1  Reasons for discharge: treatment goals met and discharge from hospital  Patient/family agrees with progress made and goals  achieved: Yes  OT Discharge Precautions/Restrictions  Precautions Precautions: Fall Other Brace: fracture shoe on the left foot Restrictions Weight Bearing Restrictions: No   Pain Pain Assessment Pain Scale: Faces Pain Score: 0-No pain ADL ADL Eating: Independent Where Assessed-Eating: Edge of bed Grooming: Independent Where Assessed-Grooming: Wheelchair Upper Body Bathing: Supervision/safety Where Assessed-Upper Body Bathing: Shower Lower Body Bathing: Supervision/safety Where Assessed-Lower Body Bathing: Shower Upper Body Dressing: Setup Where Assessed-Upper Body Dressing: Chair Lower Body Dressing: Supervision/safety Where Assessed-Lower Body Dressing: Wheelchair Toileting: Supervision/safety Where Assessed-Toileting: Glass blower/designer: Close supervision Toilet Transfer Method: Counselling psychologist: Energy manager: Close supervision Social research officer, government Method: Heritage manager: Gaffer Baseline Vision/History: Wears glasses Wears Glasses: Reading only Patient Visual Report: No change from baseline Vision Assessment?: No apparent visual deficits Perception  Perception: Within Functional Limits Praxis Praxis: Intact Cognition Overall Cognitive Status: History of cognitive impairments - at baseline Arousal/Alertness: Awake/alert Orientation Level: Oriented X4 Attention: Sustained Sustained Attention: Appears intact Memory: Impaired Memory Impairment: Retrieval deficit Awareness: Impaired Awareness Impairment: Anticipatory impairment Problem Solving: Appears intact Safety/Judgment: Impaired Comments: Pt still requires supervision for safety with selfcare tasks secondary to decreased memory, which was baseline. Sensation Sensation Light Touch: Appears Intact(sensation intact in BUEs) Hot/Cold: Appears Intact Proprioception: Appears Intact Stereognosis: Appears  Intact Additional Comments: Sensation WFLs for BUEs Coordination Gross Motor Movements are Fluid and Coordinated: Yes Fine Motor Movements are Fluid and Coordinated: Yes Coordination and Movement Description: BUE coordination [place] for selfcare tasks Motor  Motor Motor - Discharge Observations: generalized weakness Mobility  Transfers  Sit to Stand: Supervision/Verbal cueing Stand to Sit: Supervision/Verbal cueing  Trunk/Postural Assessment  Cervical Assessment Cervical Assessment: Exceptions to WFL(cervical protraction at rest) Thoracic Assessment Thoracic Assessment: Exceptions to WFL(slight thoracic rounding) Lumbar Assessment Lumbar Assessment: Exceptions to WFL(posterior pelvic tilt)  Balance Balance Balance Assessed: Yes Standardized Balance Assessment Standardized Balance Assessment: Berg Balance Test Berg Balance Test Sit to Stand: Able to stand without using hands and stabilize independently Standing Unsupported: Able to stand 2 minutes with supervision Sitting with Back Unsupported but Feet Supported on Floor or Stool: Able to sit safely and securely 2 minutes Stand to Sit: Sits safely with minimal use of hands Transfers: Able to transfer safely, definite need of hands Standing Unsupported with Eyes Closed: Able to stand 10 seconds safely Standing Ubsupported with Feet Together: Able to place feet together independently and stand 1 minute safely From Standing, Reach Forward with Outstretched Arm: Can reach forward >12 cm safely (5") From Standing Position, Pick up Object from Floor: Able to pick up shoe, needs supervision From Standing Position, Turn to Look Behind Over each Shoulder: Looks behind one side only/other side shows less weight shift Turn 360 Degrees: Able to turn 360 degrees safely one side only in 4 seconds or less Standing Unsupported, Alternately Place Feet on Step/Stool: Able to stand independently and complete 8 steps >20 seconds Standing Unsupported,  One Foot in Front: Loses balance while stepping or standing Standing on One Leg: Unable to try or needs assist to prevent fall Total Score: 41 Static Sitting Balance Static Sitting - Balance Support: Feet supported Static Sitting - Level of Assistance: 7: Independent Dynamic Sitting Balance Dynamic Sitting - Balance Support: During functional activity Dynamic Sitting - Level of Assistance: 5: Stand by assistance Static Standing Balance Static Standing - Balance Support: During functional activity;Bilateral upper extremity supported Static Standing - Level of Assistance: 5: Stand by assistance Dynamic Standing Balance Dynamic Standing - Balance Support: During functional activity Dynamic Standing - Level of Assistance: 5: Stand by assistance Extremity/Trunk Assessment RUE Assessment RUE Assessment: Within Functional Limits LUE Assessment LUE Assessment: Within Functional Limits   Rosalena Mccorry OTR/L 06/03/2019, 3:53 PM

## 2019-06-03 NOTE — Progress Notes (Signed)
Physical Therapy Session Note  Patient Details  Name: [name]. MRN: [id number] Date of Birth: 1941-07-16  Today's Date: 06/03/2019 PT Individual Time: 1101-1200 PT Individual Time Calculation (min): 59 min   Short Term Goals: Week 1:  PT Short Term Goal 1 (Week 1): STG=LTG due to ELOS  Skilled Therapeutic Interventions/Progress Updates:    Pt seated in w/c upon PT arrival, agreeable to therapy tx and denies pain. Pt's family present this session for family education- wife, daughter. Discussed doing HHPT over OPT initially in order to pt to received occupational and physical therapy in the home. Educated family on transitioning to outpatient after home health. Pt ambulated throughout the unit this session with rollator and supervision 150 ft, family instructed in appropriate cues to provide for safety. Pt ambulated with rollator and supervision from family. Pt performed car transfer this session with supervision, ambulated to the rehab apartment x 80 ft with rollator and supervision. Pt performed furniture transfer this session with supervision and rollator. Pt ambulated to the gym and performed floor transfer this session with min assist and cues for techniques, instructed family that his daughter can help provide assist with this but we do not recommend having his wife help him off the ground if he falls. Educated family on fall risk and safety. Pt ambulated to the steps and ascended/descended 4 steps with B rails and supervision, ascended/descended 4 steps with single rail and supervision. Pt ambulated back to his room with rollator and supervision x 150 ft. Pt left in w/c with needs in reach and chair alarm set.   Therapy Documentation Precautions:  Precautions Precautions: Fall Required Braces or Orthoses: Other Brace Other Brace: fracture shoe on the left foot Restrictions Weight Bearing Restrictions: Yes LLE Weight Bearing: Weight bearing as tolerated    Therapy/Group:  Individual Therapy  Netta Corrigan, PT, DPT 06/03/2019, 7:54 AM

## 2019-06-03 NOTE — Progress Notes (Signed)
Progress Note  Patient Name: [name]. Date of Encounter: 06/03/2019  Primary Cardiologist: Jamie Ruths, MD   Subjective   Doing well today.  No cardiac complaints.  Edema seems to be controlled.  No more dyspnea.  No chest pain.  Creatinine improved today.  Inpatient Medications    Scheduled Meds: . allopurinol  300 mg Oral Daily  . aspirin EC  81 mg Oral Daily  . docusate sodium  100 mg Oral BID  . ferrous sulfate  325 mg Oral Q breakfast  . furosemide  40 mg Oral BID  . gabapentin  300 mg Oral TID  . hydrocerin   Topical BID  . metoprolol tartrate  50 mg Oral BID  . mirabegron ER  50 mg Oral Daily  . pantoprazole  40 mg Oral Daily  . potassium chloride SA  20 mEq Oral Daily  . rosuvastatin  40 mg Oral Daily  . sertraline  200 mg Oral Daily  . tamsulosin  0.4 mg Oral QHS   Continuous Infusions:  PRN Meds: acetaminophen, alum & mag hydroxide-simeth, bisacodyl, diphenhydrAMINE, guaiFENesin-dextromethorphan, polyethylene glycol, prochlorperazine **OR** prochlorperazine **OR** prochlorperazine, sodium phosphate, traMADol, traZODone   Vital Signs    Vitals:   06/02/19 1400 06/02/19 1941 06/02/19 2123 06/03/19 0550  BP: 90/61 108/82  (!) 118/98  Pulse: (!) 54 75 78 100  Resp: 14 16 18 16   Temp: 97.6 F (36.4 C) 98.9 F (37.2 C)  98.2 F (36.8 C)  TempSrc: Oral Oral  Oral  SpO2: 100% 100% 100% 98%  Weight:    83.1 kg    Intake/Output Summary (Last 24 hours) at 06/03/2019 1158 Last data filed at 06/03/2019 0744 Gross per 24 hour  Intake 596 ml  Output 1675 ml  Net -1079 ml   Filed Weights   06/01/19 0524 06/02/19 0351 06/03/19 0550  Weight: 82.6 kg 82.7 kg 83.1 kg    Telemetry    n/a  ECG    n/a  Physical Exam   GEN: No acute distress.   Neck: No JVD Cardiac: RRR, no murmurs, rubs, or gallops.  Respiratory: Clear to auscultation bilaterally. GI: Soft, nontender, non-distended  MS: No edema; No deformity. Chronic venous stasis  changes up to the knee Neuro:  Nonfocal  Psych: Normal affect   Labs    Chemistry Recent Labs  Lab 05/29/19 0552 06/02/19 0752 06/03/19 0837  NA 140 140 141  K 4.6 4.1 4.0  CL 104 103 104  CO2 28 29 28   GLUCOSE 91 91 137*  BUN 32* 40* 36*  CREATININE 1.38* 1.62* 1.53*  CALCIUM 9.4 9.4 9.5  GFRNONAA 49* 41* 44*  GFRAA 57* 47* 50*  ANIONGAP 8 8 9      Hematology Recent Labs  Lab 05/29/19 0552  WBC 6.4  RBC 4.02*  HGB 11.2*  HCT 35.7*  MCV 88.8  MCH 27.9  MCHC 31.4  RDW 15.2  PLT 124*    Cardiac Enzymes No results for input(s): TROPONINI in the last 168 hours. No results for input(s): TROPIPOC in the last 168 hours.   BNP No results for input(s): BNP, PROBNP in the last 168 hours.   DDimer No results for input(s): DDIMER in the last 168 hours.   Radiology    No results found.  Cardiac Studies    2D Echo May 17, 2019: Normal LV size with low normal function.  EF 50 to 55% with septal hypokinesis.  (Expect pacemaker related pacemaker rounds).  RV was  mildly dilated.  Severe TR (suggestive of pulmonary hypertension -thought to be related to pulmonary venous congestion/right-sided heart failure) despite normal IV size.  Patient Profile     78 y.o. 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  He was initially admitted with predominantly right greater than left sided heart failure.  But was also noted to have left foot cellulitis.  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 Postoperatively he developed transient diplopia and thought to potentially have had brainstem infarct versus TIA. --Despite having A. 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   Assessment & Plan    1. Right heart > Left Heart failure -during index hospitalization was diuresed over 30 pounds and was recommended to be  on 80 mg twice daily Lasix.  Prior to transfer to rehab, temporarily had diuretic and beta-blocker held for bradycardia and worsening renal function.  Now back on Lasix with increasing creatinine.  Primary team is written for Lasix 40 mg p.o. twice daily   Creatinine is better today.  Would restart today with second dose 40 mg.    Tomorrow would plan discharge dose of 80 mg once daily in the morning with an additional 40 mg the afternoon as necessary based upon sliding scale  Sliding scale Lasix: Pt should weigh himself when you get home, then Daily in the Morning. Your dry weight will be what your scale says on the day you return home.(here is 183 lbs.).   If you gain more than 3 pounds from dry weight: Increase the Lasix dosing to 80 mg in the morning and 40 mg in the afternoon until weight returns to baseline dry weight.  If weight gain is greater than 5 pounds in 2 days: Increased to Lasix 80 mg twice a day and contact the office for further assistance if weight does not go down the next day.  If the weight goes down more than 3 pounds from dry weight: Hold Lasix until it returns to baseline dry weight    --Suspect right heart failure from combination of pulmonary fibrosis and portal venous congestion --> may potentially lead to some congestive hepatopathy related cirrhosis.   Okay to continue twice daily metoprolol with preserved EF.    Would consider adding spironolactone 12.5 mg on discharge -however, with borderline blood pressures, would potentially consider starting in the outpatient setting once we see what his blood pressures normalize to   2. PPM - he has reached ERI. --Per Dr. Lovena Le, recommendation was PPM GEN change short at discharge.    Plan EP f/u on d/c --probably needs at least 2 weeks out of the hospital before PPM GEN change.  3.  Known CAD: On aspirin and statin as well as beta-blocker.  Stable.  4.  History of PAF: Not a candidate for [place] due to history of  prior SDH.      For questions or updates, please contact [facility] Please consult [email or web address] for contact info under Cardiology/STEMI.   Signed, Jamie Hew, MD  06/03/2019, 11:58 AM  Patient ID: [name]., male   DOB: May 17, 1941, 78 y.o.   MRN: [id number]

## 2019-06-03 NOTE — Progress Notes (Signed)
Physical Therapy Session Note  Patient Details  Name: [name]. MRN: [id number] Date of Birth: [birth year]  Today's Date: 06/03/2019 PT Individual Time: 3903-0092 PT Individual Time Calculation (min): 76 min   Short Term Goals: Week 1:  PT Short Term Goal 1 (Week 1): STG=LTG due to ELOS  Skilled Therapeutic Interventions/Progress Updates:    Received sitting in w/c and agreeable to therapy session. Performed sit<>stand using rollator mod-I throughout session. Ambulated ~1105ft using rollator to therapy gym with close supervision for safety. Pt demonstrated ability to pick up object from floor with proper locking of rollator breaks prior to reaching for object with close supervision for safety. Ambulated ~91ft using rollator over gymnastic mat for unlevel surfaces with CGA for steadying. Ambulated ~75ft using rollator with close supervision. Performed sit<>stand furniture transfer on couch using rollator with close supervision for safety. Ambulated in ADL apartment kitchen with focus on education regarding proper rollator use and safety while reaching in cabinets with pt demonstrating understanding with minimal to no cuing and close supervision for safety - educated patient on use of rollator bag for carrying items in kitchen with pt demonstrating understanding. Ambulated 134ft using rollator with close supervision. Ambulated ~60ft up/down ramp using rollator with min cuing on use of breaks for descent and close supervision for safety throughout. Ambulated ~167ft back to main gym using rollator with close supervision. Ascended/descended 12 steps using bilateral handrails with close supervision for safety - pt reports he has grab bars on both sides of the steps to enter his home but does not have actual handrails - tried to replicate this with stair handrails. Ambulated ~153ft back to room using rollator with close supervision. Sit>supine with supervision. Pt left supine in bed with needs in reach  and bed alarm on.  Therapy Documentation Precautions:  Precautions Precautions: Fall Required Braces or Orthoses: Other Brace Other Brace: fracture shoe on the left foot Restrictions Weight Bearing Restrictions: Yes LLE Weight Bearing: Weight bearing as tolerated  Pain: Denies pain during session.   Therapy/Group: Individual Therapy  Tawana Scale, PT, DPT  06/03/2019, 1:50 PM

## 2019-06-04 MED ORDER — GABAPENTIN 300 MG PO CAPS: 300.0000 mg | ORAL_CAPSULE | Freq: Three times a day (TID) | ORAL | 1 refills | Status: DC

## 2019-06-04 MED ORDER — HYDROCERIN EX CREA: 1.0000 "application " | TOPICAL_CREAM | Freq: Two times a day (BID) | CUTANEOUS | 0 refills | Status: DC

## 2019-06-04 MED ORDER — FUROSEMIDE 40 MG PO TABS: 80.0000 mg | ORAL_TABLET | Freq: Every day | ORAL | Status: DC

## 2019-06-04 MED ORDER — METOPROLOL TARTRATE 50 MG PO TABS: 50.0000 mg | ORAL_TABLET | Freq: Two times a day (BID) | ORAL | 1 refills | Status: DC

## 2019-06-04 MED ORDER — POTASSIUM CHLORIDE CRYS ER 20 MEQ PO TBCR: 20.0000 meq | EXTENDED_RELEASE_TABLET | Freq: Every day | ORAL | 1 refills | Status: DC

## 2019-06-04 MED ORDER — DOCUSATE SODIUM 100 MG PO CAPS: 100.0000 mg | ORAL_CAPSULE | Freq: Two times a day (BID) | ORAL | 0 refills | Status: DC

## 2019-06-04 MED ORDER — FERROUS SULFATE 325 (65 FE) MG PO TABS: 325.0000 mg | ORAL_TABLET | Freq: Every day | ORAL | 1 refills | Status: DC

## 2019-06-04 MED ORDER — FUROSEMIDE 40 MG PO TABS: 80.0000 mg | ORAL_TABLET | Freq: Every day | ORAL | 0 refills | Status: DC

## 2019-06-04 NOTE — Discharge Instructions (Signed)
Inpatient Rehab Discharge Instructions  [name]. Discharge date and time:    Activities/Precautions/ Functional Status: Activity: no lifting, driving, or strenuous exercise for till cleared by MD Diet: cardiac diet and diabetic diet Wound Care: Wash with soap and water. Keep clean and dry. Cover with dry dressing.    Functional status:  ___ No restrictions     ___ Walk up steps independently ___ 24/7 supervision/assistance   ___ Walk up steps with assistance ___ Intermittent supervision/assistance  ___ Bathe/dress independently ___ Walk with walker     ___ Bathe/dress with assistance ___ Walk Independently    ___ Shower independently ___ Walk with assistance    ___ Shower with assistance _X__ No alcohol     ___ Return to work/school ________  COMMUNITY REFERRALS UPON DISCHARGE:   Home Health:   PT     OT     RN  Agency:  [facility] Phone:  [phone] Medical Equipment/Items Ordered:  3-in-1  Agency/Supplier:  [employer]      Phone:  [phone]  GENERAL COMMUNITY RESOURCES FOR PATIENT/FAMILY: Support Groups:  [facility] Stroke Support Group - on hold right now for COVID-19                              Meets the second Thursday of each month from 6 - 7 PM (except June, July, August)                              [facility], [address], [place]                               For more information, call [phone]  Special Instructions: for Lasix use- You should weigh yourself today when you get home, then daily in morning. Your dry weight will be what your scale says on the day you return home.(here it is 180 lbs.).   If you gain more than 3 pounds from dry weight: Increase the Lasix dosing to 80 mg in the morning and 40 mg in the afternoon until weight returns to baseline dry weight.  If weight gain is greater than 5 pounds in 2 days: Increased to Lasix 80 mg twice a day and contact  the office for further assistance if weight does not go down the next day.  If the weight goes down more than 3 pounds from dry weight: Hold Lasix until it returns to baseline dry weight     My questions have been answered and I understand these instructions. I will adhere to these goals and the provided educational materials after my discharge from the hospital.  Patient/Caregiver Signature _______________________________ Date __________  Clinician Signature _______________________________________ Date __________  Please bring this form and your medication list with you to all your follow-up doctor's appointments.

## 2019-06-04 NOTE — Progress Notes (Signed)
[facility] PHYSICAL MEDICINE & REHABILITATION PROGRESS NOTE   Subjective/Complaints:  Appreciate cardiology note   ROS- no CP, SOB, N/V?D   Objective:   No results found. No results for input(s): WBC, HGB, HCT, PLT in the last 72 hours. Recent Labs    06/02/19 0752 06/03/19 0837  NA 140 141  K 4.1 4.0  CL 103 104  CO2 29 28  GLUCOSE 91 137*  BUN 40* 36*  CREATININE 1.62* 1.53*  CALCIUM 9.4 9.5    Intake/Output Summary (Last 24 hours) at 06/04/2019 0930 Last data filed at 06/04/2019 9381 Gross per 24 hour  Intake 480 ml  Output 600 ml  Net -120 ml     Physical Exam: Vital Signs Blood pressure 110/72, pulse 91, temperature 98 F (36.7 C), temperature source Oral, resp. rate 16, weight 81.5 kg, SpO2 99 %.   General: No acute distress Mood and affect are appropriate Oriented x 3  Heart: Regular rate and rhythm no rubs murmurs or extra sounds Lungs: Clear to auscultation, breathing unlabored, no rales or wheezes Abdomen: Positive bowel sounds, soft nontender to palpation, nondistended Extremities: No clubbing, cyanosis, or edema Skin: No evidence of breakdown, Left foot bulky dressing, + stasis dermatitis Neurologic: Cranial nerves II through XII intact, motor strength is 4/5 in bilateral deltoid, bicep, tricep, grip, hip flexor, knee extensors, ankle dorsiflexor and plantar flexor  Cerebellar exam  mild intention tremor in BUE otherwise WNL     Musculoskeletal: Full range of motion in all 4 extremities. No joint swelling    Assessment/Plan:  1. 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  Stable for D/C today F/u PCP in 3-4 weeks F/u PM&R 2 weeks F/u cardiology and electrophysiology  See D/C summary  See D/C instructions  Care Tool:  Bathing    Body parts bathed by patient: Right arm, Left arm, Chest, Abdomen, Front perineal area, Buttocks, Left upper leg, Right upper leg, Face, Right lower leg, Left lower leg         Bathing assist Assist  Level: Supervision/Verbal cueing     Upper Body Dressing/Undressing Upper body dressing   What is the patient wearing?: Pull over shirt    Upper body assist Assist Level: Independent    Lower Body Dressing/Undressing Lower body dressing      What is the patient wearing?: Underwear/pull up, Pants     Lower body assist Assist for lower body dressing: Supervision/Verbal cueing     Toileting Toileting    Toileting assist Assist for toileting: Supervision/Verbal cueing     Transfers Chair/bed transfer  Transfers assist     Chair/bed transfer assist level: Supervision/Verbal cueing     Locomotion Ambulation   Ambulation assist      Assist level: Supervision/Verbal cueing Assistive device: Rollator Max distance: 15'   Walk 10 feet activity   Assist     Assist level: Supervision/Verbal cueing Assistive device: Rollator   Walk 50 feet activity   Assist    Assist level: Supervision/Verbal cueing Assistive device: Rollator    Walk 150 feet activity   Assist    Assist level: Supervision/Verbal cueing Assistive device: Rollator    Walk 10 feet on uneven surface  activity   Assist     Assist level: Supervision/Verbal cueing(up/down ramp) Assistive device: Rollator   Wheelchair     Assist Will patient use wheelchair at discharge?: No Type of Wheelchair: Manual    Wheelchair assist level: Supervision/Verbal cueing Max wheelchair distance: 150 ft    Wheelchair 50  feet with 2 turns activity    Assist        Assist Level: Supervision/Verbal cueing   Wheelchair 150 feet activity     Assist     Assist Level: Supervision/Verbal cueing    Medical Problem List and Plan: 1.Functional deficitssecondary to debility and brainstem CVA Continue CIR PT OT Changed d/c to 6/17  2. Antithrombotics: -DVT/anticoagulation:Mechanical:Sequential compression devices, below kneeBilateral lower  extremities -antiplatelet therapy: ASA 3. Pain Management:tylenol prn 4. Mood:LCSW to follow for evaluation and support. -antipsychotic agents: N/A 5. Neuropsych: This patient is not fullycapable of making decisions on hisown behalf. 6. Skin/Wound Care:Routine pressure relief measures. 7. Fluids/Electrolytes/Nutrition:MOnitor I/o.  Weights going down, BUN creatinine rising, will reduce Lasix to 40 mg twice daily expect BUN to correct recheck BMET today  8.Left great toe amputation:Monitor for healing. 9. CAD s/p PPM/CAF: Monitor HR tid--continue ASA and Crestor. Metoprolol resumed on 6/8--monitor for recurrent bradycardia. 10. Pulmonary fibrosis/OSA: Continue to encourage use of CPAP when sleeping. 11. T2DM-diet controlled: Hgb A 1c- 5.6. Will monitor BS ac/hs and use SSI for elevated BS.  No CBG needed 12. Lewy body dementia?: Hallucinations with STM deficits and falls per notes. Neurology following with work up underway. Avoid neuroleptics  13. [name] with pancytopenia/severe peripheral neuropathy:Monitor platelets 14. H/p BPH: continue on flomax 15. Acute on chronic systolic and diastolic heart failure: Monitor daily weights. Strict I/O--1200 cc FR.Continue Crestor--lasix resumed 6/8. -Will check orthostatic vitals as reporting dizziness with positional changes. -daily weights- trending down, slowly  6/15, appreciate cardiology assist    [facility] Weights   06/02/19 0351 06/03/19 0550 06/04/19 0538  Weight: 82.7 kg 83.1 kg 81.5 kg    LOS: 9 days A FACE TO FACE EVALUATION WAS PERFORMED  Luanna Salk Veora Fonte 06/04/2019, 9:30 AM

## 2019-06-04 NOTE — Progress Notes (Signed)
Patient given discharge instructions by Algis Liming, PA. Patient was wheeled down vis wheelchair with all personal belongings by nurse and nurse tech. Nicholes Rough, RN

## 2019-06-04 NOTE — Discharge Summary (Signed)
Physician Discharge Summary  Patient ID: [name]. MRN: [id number] DOB/AGE: May 11, 1941 78 y.o.  Admit date: 05/26/2019 Discharge date: 06/04/2019  Discharge Diagnoses:  Principal Problem:   Debility Active Problems:   Atrial fibrillation ([place])   PACEMAKER, PERMANENT   Hx of CABG   Severe tricuspid regurgitation   Acute on chronic right-sided heart failure (HCC)   Acute on chronic diastolic heart failure (HCC)   Coronary artery disease involving native heart without angina pectoris   Discharged Condition:  Stable   Significant Diagnostic Studies: N/A   Labs:  Basic Metabolic Panel: BMP Latest Ref Rng & Units 06/03/2019 06/02/2019  Glucose 70 - 99 mg/dL 137(H) 91  BUN 8 - 23 mg/dL 36(H) 40(H)  Creatinine 0.61 - 1.24 mg/dL 1.53(H) 1.62(H)  BUN/Creat Ratio 10 - 24 - -  Sodium 135 - 145 mmol/L 141 140  Potassium 3.5 - 5.1 mmol/L 4.0 4.1  Chloride 98 - 111 mmol/L 104 103  CO2 22 - 32 mmol/L 28 29  Calcium 8.9 - 10.3 mg/dL 9.5 9.4    CBC: CBC Latest Ref Rng & Units 05/29/2019 05/27/2019 05/26/2019  WBC 4.0 - 10.5 K/uL 6.4 5.2 5.5  Hemoglobin 13.0 - 17.0 g/dL 11.2(L) 11.7(L) 10.2(L)  Hematocrit 39.0 - 52.0 % 35.7(L) 36.9(L) 32.6(L)  Platelets 150 - 400 K/uL 124(L) 90(L) 87(L)    CBG: No results for input(s): GLUCAP in the last 168 hours.  Brief HPI:   [name] is a 78 year old male with history of CAD, CAF, T2DM, pulmonary fibrosis, Lewy body dementia who was admitted via cardiology office on 05/15/19 with few month history of SOB, cellulitis left foot, peripheral edema and reports of frequent falls.  He was treated for acute on chronic CHF and started on antibiotics for cellulitis. ABI without significant disease.  He underwent left toe amputation on 5/30 by Dr. Marlou Sa.  He did develop transient diplopia with dizziness on 06/03 and CTA head was negative.  Neurology question brainstem infarct versus TIA due to small vessel disease placed on aspirin for secondary stroke  prevention.  Lasix was discontinued on 6/2 due to acute renal failure and metoprolol discontinued 6/3 due to bradycardia.  Hospital course significant for ABLA, AKI, thrombocytopenia as well as debility.  He continued to be limited by poor safety awareness and balance deficits.  CIR was recommended for follow-up therapy   Hospital Course: [name]. was admitted to rehab 05/26/2019 for inpatient therapies to consist of PT, ST and OT at least three hours five days a week. Past admission physiatrist, therapy team and rehab RN have worked together to provide customized collaborative inpatient rehab. He was  maintained on 1200 cc FR/day with daily weights as well as strict I/O due to recent exacerbation of CHF. Orthostatic weights were monitored due to reports of dizziness with positional changes.  Cardiology was consulted for input on CHF management as well as resumption of cardiac medications.  Lasix was resumed and has been adjusted during his stay with close monitoring of renal status. As renal status had improved, he was discharged on Lasix 80 mg daily with sliding scale lasix to be used based on weight gain or weight loss.    Serial CBC showed that thrombocytopenia is resolving and H/H has improved ot 11.2/88.8.  Left toe incision is C/D/I and is healing well. Sutures were removed without difficulty on 06/17 and he was advised to follow up with ortho for post op check.  He has been  compliant with CPAP use and is able to don/doff it independently.  Confusion has resolved and no signs of agitation noted. Orthostatic symptoms have resolved but he continues to have balance deficits requiring close supervision with mobility.  He has made good gains and is currently at supervision to min assist level.  He will continue to receive follow up HHPT, [place] and [place] [facility] [place] after discharge.    Rehab course: During patient's stay in rehab weekly team conferences were held to monitor patient's  progress, set goals and discuss barriers to discharge. At admission, patient required min assist with mobility and mod assist with basic self care tasks.  He  has had improvement in activity tolerance, balance, postural control as well as ability to compensate for deficits. He is able to complete ADL tasks at supervision level. He requires supervision for transfers and close supervision to ambulate 150' with rollater.  Family education was completed with wife and daughter regarding all aspects of care and assistance.     Disposition:  Home  Diet: heart healthy/1200 cc FR/day.   Special Instructions: 1. No driving or strenuous activity till cleared by MD. 2. Keep toe incision clean and dry. 3. Special Instructions: for Lasix use- Weigh today and daily in morning.  For weight gain more than 3 pounds from dry weight: Increase the Lasix dosing to 80 mg in the morning and 40 mg in the afternoon until weight returns to baseline dry weight.  If weight gain is greater than 5 pounds in 2 days: Increased to Lasix 80 mg twice a day and contact the office for further assistance if weight does not go down the next day.  If the weight goes down more than 3 pounds from dry weight: Hold Lasix until it returns to baseline dry weight      Discharge Instructions    Ambulatory referral to Physical Medicine Rehab   Complete by: As directed    1-2 weeks transitional care appt     Allergies as of 06/04/2019      Reactions   Lipitor [atorvastatin] Other (See Comments)   Stiff joints   Nsaids Other (See Comments)   unknown   Warfarin And Related Other (See Comments)   Stiff joints   Allopurinol Itching   May or may not be allergic to this (If the patient should find out he is NOT allergic, please omit this entry.)   Enbrel [etanercept] Itching   May or may not be allergic to this (If the patient should find out he is NOT allergic, please omit this entry.)      Medication List    STOP taking these  medications   colchicine 0.6 MG tablet     TAKE these medications   acetaminophen 325 MG tablet Commonly known as: TYLENOL Take 1-2 tablets (325-650 mg total) by mouth every 4 (four) hours as needed for mild pain.   allopurinol 300 MG tablet Commonly known as: ZYLOPRIM Take 1 tablet by mouth daily.   aspirin 81 MG EC tablet Take 1 tablet (81 mg total) by mouth daily.   docusate sodium 100 MG capsule Commonly known as: COLACE Take 1 capsule (100 mg total) by mouth 2 (two) times daily. Notes to patient: For constipation--OTC   ferrous sulfate 325 (65 FE) MG tablet Take 1 tablet (325 mg total) by mouth daily with breakfast. Notes to patient: For anemia--OTC   Flomax 0.4 MG Caps capsule Generic drug: tamsulosin Take 0.4 mg by mouth at bedtime.  furosemide 40 MG tablet Commonly known as: LASIX Take 2 tablets (80 mg total) by mouth daily. What changed: when to take this   gabapentin 300 MG capsule Commonly known as: NEURONTIN Take 1 capsule (300 mg total) by mouth 3 (three) times daily.   hydrocerin Crea Apply 1 application topically 2 (two) times daily. Notes to patient: For dry skin   metoprolol tartrate 50 MG tablet Commonly known as: LOPRESSOR Take 1 tablet (50 mg total) by mouth 2 (two) times daily. What changed: how much to take   Myrbetriq 50 MG Tb24 tablet Generic drug: mirabegron ER TAKE 1 TABLET BY MOUTH EVERY DAY What changed: how much to take   pantoprazole 40 MG tablet Commonly known as: PROTONIX Take 40 mg by mouth daily.   potassium chloride SA 20 MEQ tablet Commonly known as: K-DUR Take 1 tablet (20 mEq total) by mouth daily.   rosuvastatin 40 MG tablet Commonly known as: CRESTOR Take 1 tablet (40 mg total) by mouth daily. Notes to patient: Start tomorrow   sertraline 100 MG tablet Commonly known as: ZOLOFT Take 200 mg by mouth daily.      Follow-up Information    Kirsteins, Luanna Salk, MD Follow up.   Specialty: Physical Medicine and  Rehabilitation Contact information: [place] [address] [phone]        Meredith Pel, MD. Call.   Specialty: Orthopedic Surgery Why: for follow up appointment Contact information: [place] [address] [phone]        [name], MD. Go on 06/19/2019.   Specialty: Internal Medicine Why: @ 10:30 AM Contact information: [address] [place] [address] [phone]        [name], MD .   Specialty: Cardiology Contact information: [address] [place] [address] [phone]           Signed: Bary Leriche 06/11/2019, 9:22 AM

## 2019-06-05 NOTE — Progress Notes (Signed)
Social Work Discharge Note  The overall goal for the admission was met for:   Discharge location: Yes - home with wife and dtr  Length of Stay: Yes - 11 days  Discharge activity level: Yes - supervision/CGA  Home/community participation: Yes  Services provided included: MD, RD, PT, OT, RN, Pharmacy and SW  Financial Services: Medicare  Follow-up services arranged: Home Health: PT/OT/RN, DME: 3-in-1 and Patient/Family request agency [place], DME: [employer]  Comments (or additional information):  Pt's wife and dtr came in for family education and feel prepared to have pt at home.  Patient/Family verbalized understanding of follow-up arrangements: Yes  Individual responsible for coordination of the follow-up plan: pt's wife, [name] [phone] (h); [phone] (m) and [name] - dtr - [phone]  Confirmed correct DME delivered: [name] 06/05/2019    [name], Silvestre Mesi

## 2019-06-06 ENCOUNTER — Encounter: Payer: Self-pay | Admitting: *Deleted

## 2019-06-06 ENCOUNTER — Telehealth: Payer: Self-pay | Admitting: *Deleted

## 2019-06-06 ENCOUNTER — Other Ambulatory Visit (HOSPITAL_COMMUNITY)
Admission: RE | Admit: 2019-06-06 | Discharge: 2019-06-06 | Disposition: A | Discharge status: Home / Self Care | Payer: Medicare Other | Source: Ambulatory Visit | Attending: Cardiology | Admitting: Cardiology

## 2019-06-06 DIAGNOSIS — D472 Monoclonal gammopathy: Secondary | ICD-10-CM | POA: Diagnosis not present

## 2019-06-06 DIAGNOSIS — R7989 Other specified abnormal findings of blood chemistry: Secondary | ICD-10-CM | POA: Insufficient documentation

## 2019-06-06 DIAGNOSIS — I2510 Atherosclerotic heart disease of native coronary artery without angina pectoris: Secondary | ICD-10-CM | POA: Diagnosis not present

## 2019-06-06 DIAGNOSIS — I5032 Chronic diastolic (congestive) heart failure: Secondary | ICD-10-CM | POA: Diagnosis not present

## 2019-06-06 DIAGNOSIS — E1142 Type 2 diabetes mellitus with diabetic polyneuropathy: Secondary | ICD-10-CM | POA: Diagnosis not present

## 2019-06-06 DIAGNOSIS — Z89422 Acquired absence of other left toe(s): Secondary | ICD-10-CM | POA: Diagnosis not present

## 2019-06-06 DIAGNOSIS — Z8679 Personal history of other diseases of the circulatory system: Secondary | ICD-10-CM | POA: Diagnosis not present

## 2019-06-06 DIAGNOSIS — N172 Acute kidney failure with medullary necrosis: Secondary | ICD-10-CM | POA: Diagnosis not present

## 2019-06-06 DIAGNOSIS — I69398 Other sequelae of cerebral infarction: Secondary | ICD-10-CM | POA: Diagnosis not present

## 2019-06-06 DIAGNOSIS — Z5181 Encounter for therapeutic drug level monitoring: Secondary | ICD-10-CM | POA: Diagnosis not present

## 2019-06-06 DIAGNOSIS — Z951 Presence of aortocoronary bypass graft: Secondary | ICD-10-CM | POA: Diagnosis not present

## 2019-06-06 DIAGNOSIS — I110 Hypertensive heart disease with heart failure: Secondary | ICD-10-CM | POA: Diagnosis not present

## 2019-06-06 DIAGNOSIS — G3183 Dementia with Lewy bodies: Secondary | ICD-10-CM | POA: Diagnosis not present

## 2019-06-06 DIAGNOSIS — Z7982 Long term (current) use of aspirin: Secondary | ICD-10-CM | POA: Diagnosis not present

## 2019-06-06 DIAGNOSIS — Z87891 Personal history of nicotine dependence: Secondary | ICD-10-CM | POA: Diagnosis not present

## 2019-06-06 DIAGNOSIS — Z950 Presence of cardiac pacemaker: Secondary | ICD-10-CM | POA: Diagnosis not present

## 2019-06-06 DIAGNOSIS — F0281 Dementia in other diseases classified elsewhere with behavioral disturbance: Secondary | ICD-10-CM | POA: Diagnosis not present

## 2019-06-06 DIAGNOSIS — Z4781 Encounter for orthopedic aftercare following surgical amputation: Secondary | ICD-10-CM | POA: Diagnosis not present

## 2019-06-06 DIAGNOSIS — R2689 Other abnormalities of gait and mobility: Secondary | ICD-10-CM | POA: Diagnosis not present

## 2019-06-06 LAB — BASIC METABOLIC PANEL
Anion gap: 12 (ref 5–15)
BUN: 41 mg/dL — ABNORMAL HIGH (ref 8–23)
CO2: 26 mmol/L (ref 22–32)
Calcium: 9.4 mg/dL (ref 8.9–10.3)
Chloride: 104 mmol/L (ref 98–111)
Creatinine, Ser: 1.86 mg/dL — ABNORMAL HIGH (ref 0.61–1.24)
GFR calc Af Amer: 40 mL/min — ABNORMAL LOW (ref >60)
GFR calc non Af Amer: 34 mL/min — ABNORMAL LOW (ref >60)
Glucose, Bld: 132 mg/dL — ABNORMAL HIGH (ref 70–99)
Potassium: 3.9 mmol/L (ref 3.5–5.1)
Sodium: 142 mmol/L (ref 135–145)

## 2019-06-06 NOTE — Telephone Encounter (Signed)
Malachy Mood RN fro [facility] called to verify the provider responsible for signing [facility] orders. She thought it was Dr Naaman Plummer but this will be DR Kirsteins patient. She has drawn labs and a copy is being sent to Dr Naaman Plummer, because she had him as the provider.[name]

## 2019-06-07 DIAGNOSIS — I69398 Other sequelae of cerebral infarction: Secondary | ICD-10-CM | POA: Diagnosis not present

## 2019-06-07 DIAGNOSIS — R2689 Other abnormalities of gait and mobility: Secondary | ICD-10-CM | POA: Diagnosis not present

## 2019-06-07 DIAGNOSIS — I110 Hypertensive heart disease with heart failure: Secondary | ICD-10-CM | POA: Diagnosis not present

## 2019-06-07 DIAGNOSIS — G3183 Dementia with Lewy bodies: Secondary | ICD-10-CM | POA: Diagnosis not present

## 2019-06-07 DIAGNOSIS — F0281 Dementia in other diseases classified elsewhere with behavioral disturbance: Secondary | ICD-10-CM | POA: Diagnosis not present

## 2019-06-07 DIAGNOSIS — I2510 Atherosclerotic heart disease of native coronary artery without angina pectoris: Secondary | ICD-10-CM | POA: Diagnosis not present

## 2019-06-09 ENCOUNTER — Telehealth: Payer: Self-pay | Admitting: *Deleted

## 2019-06-09 DIAGNOSIS — I2510 Atherosclerotic heart disease of native coronary artery without angina pectoris: Secondary | ICD-10-CM | POA: Diagnosis not present

## 2019-06-09 DIAGNOSIS — I110 Hypertensive heart disease with heart failure: Secondary | ICD-10-CM | POA: Diagnosis not present

## 2019-06-09 DIAGNOSIS — R2689 Other abnormalities of gait and mobility: Secondary | ICD-10-CM | POA: Diagnosis not present

## 2019-06-09 DIAGNOSIS — F0281 Dementia in other diseases classified elsewhere with behavioral disturbance: Secondary | ICD-10-CM | POA: Diagnosis not present

## 2019-06-09 DIAGNOSIS — I69398 Other sequelae of cerebral infarction: Secondary | ICD-10-CM | POA: Diagnosis not present

## 2019-06-09 DIAGNOSIS — G3183 Dementia with Lewy bodies: Secondary | ICD-10-CM | POA: Diagnosis not present

## 2019-06-09 NOTE — Telephone Encounter (Signed)
Elizabeth PT [facility] called for POC of 1wk1,2wk5.  Approval given.

## 2019-06-10 ENCOUNTER — Telehealth: Payer: Self-pay | Admitting: Radiology

## 2019-06-10 ENCOUNTER — Telehealth: Payer: Self-pay | Admitting: *Deleted

## 2019-06-10 ENCOUNTER — Telehealth: Payer: Self-pay

## 2019-06-10 DIAGNOSIS — I110 Hypertensive heart disease with heart failure: Secondary | ICD-10-CM | POA: Diagnosis not present

## 2019-06-10 DIAGNOSIS — F0281 Dementia in other diseases classified elsewhere with behavioral disturbance: Secondary | ICD-10-CM | POA: Diagnosis not present

## 2019-06-10 DIAGNOSIS — R2689 Other abnormalities of gait and mobility: Secondary | ICD-10-CM | POA: Diagnosis not present

## 2019-06-10 DIAGNOSIS — G3183 Dementia with Lewy bodies: Secondary | ICD-10-CM | POA: Diagnosis not present

## 2019-06-10 DIAGNOSIS — I69398 Other sequelae of cerebral infarction: Secondary | ICD-10-CM | POA: Diagnosis not present

## 2019-06-10 DIAGNOSIS — I2510 Atherosclerotic heart disease of native coronary artery without angina pectoris: Secondary | ICD-10-CM | POA: Diagnosis not present

## 2019-06-10 NOTE — Telephone Encounter (Signed)
Please advise. Thanks.  

## 2019-06-10 NOTE — Telephone Encounter (Signed)
[name] is calling states that she needs additional orders.  States that there is a scab that is rubbing an area on the 3rd toe-Please call her back to advise

## 2019-06-10 NOTE — Telephone Encounter (Signed)
Is he in hospital?

## 2019-06-10 NOTE — Telephone Encounter (Signed)
Malachy Mood RN [facility] called to verify provider signing [facility] orders.  It will be Dr Letta Pate. Approval given. She has seen [name] and she had to draw labs and he had a dry dressing and changed with dry gauze.  Labs (BMP) have resulted in Epic. Creat was a little elevated. The were sent to DR [facility] as well.

## 2019-06-10 NOTE — Telephone Encounter (Signed)

## 2019-06-10 NOTE — Progress Notes (Signed)
Electrophysiology Office Note Date: 06/11/2019  ID:  Jamie Ahart., DOB 1941-05-28, MRN [id number]  PCP: [name], MD Primary Cardiologist: [name] Electrophysiologist: Lovena Le  CC: Pacemaker follow-up  Jamie Di Kindle. is a 78 y.o. male seen today for Dr Lovena Le.  He has not been seen in EP clinic since 2018. He was recently admitted and had device interrogated. He was found to be at [facility] and asked to follow up today to discuss gen change.  He presents today for routine electrophysiology followup.  Since last being seen in our clinic, the patient reports doing relatively well.  His toe has healed nicely. He is maintaining his fluid status (was diuresed 30 pounds during recent admission).   He denies chest pain, palpitations, dyspnea, PND, orthopnea, nausea, vomiting, dizziness, syncope, edema, weight gain, or early satiety.  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 (HFpEF) heart failure with preserved ejection fraction ([place])    a. 05/2013 Echo: EF 55%, mild LVH, diast dysfxn, Ao sclerosis, mildly dil LA, RV dysfxn (poorly visualized), PASP 75mmHg;  b. 03/2017 Echo: EF 55-60%, no rwma, triv MR, mildly dil RV, mod TR, PASP 66mmHg.  . Atrial fibrillation [facility])    s/p Cox Maze 1/09;  Multaq Rx d/c'd in 2014 due to pulmo fibrosis;  coumadin d/c'd in 2014 due to spontaneous subdural hematoma  . BPH (benign prostatic hyperplasia)   . CAD (coronary artery disease), native coronary artery    a. s/p CABG 12/2007;  b. Myoview 12/2011: EF 66%, no scar or ischemia; normal.  . DM (diabetes mellitus) ([place])   . Hyperlipidemia type II   . Hypertension   . 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 OSA (obstructive sleep apnea)   . Pacemaker    PPM - St. Jude  . Peripheral neuropathy 07/31/2018  . Pulmonary fibrosis ([place])    Multaq d/c'd 7/14  . Subdural hematoma ([place]) 07/2012   spontaneous;  coumadin d/c'd => no longer a candidate for anticoagulation   Past Surgical History:  Procedure Laterality Date  . AMPUTATION Left 05/17/2019   Procedure: AMPUTATION LEFT FOURTH TOE;  Surgeon: Meredith Pel, MD;  Location: [facility];  Service: Orthopedics;  Laterality: Left;  . APPENDECTOMY    . CHOLECYSTECTOMY    . CORONARY ARTERY BYPASS GRAFT     x3 (left internal mammary artery to distal left anterior descending coronary artery, saphenous vain graft to second circumflex marginal branch, saphenous vain graft to posterior descending coronary artery, endoscopic saphenous vain harvest from right thigh) and modified Cox - Maze IV procedure.  [name],MD. Electronically signed CHO/MEDQ D: 01/09/2008/ JOB: [id number] cc:  [name]  . [name]  07/30/2012   Procedure: CRANIOTOMY HEMATOMA EVACUATION SUBDURAL;  Surgeon: [name], MD;  Location: [place] NEURO ORS;  Service: Neurosurgery;  Laterality: Right;  Right craniotomy for evacuation of subdural hematoma  . FOOT SURGERY    . HERNIA REPAIR    . ORCHIECTOMY     Left  /  testicular cancer  . PACEMAKER PLACEMENT     PPM - St. Jude    Current Outpatient Medications  Medication Sig Dispense Refill  . acetaminophen (TYLENOL) 325 MG tablet Take 1-2 tablets (325-650 mg total) by mouth every 4 (four) hours as needed for mild pain.    [name] allopurinol (ZYLOPRIM) 300 MG tablet Take 1 tablet by  mouth daily.    [name] aspirin 81 MG EC tablet Take 1 tablet (81 mg total) by mouth daily.  0  . docusate sodium (COLACE) 100 MG capsule Take 1 capsule (100 mg total) by mouth 2 (two) times daily. 60 capsule 0  . ferrous sulfate 325 (65 FE) MG tablet Take 1 tablet (325 mg total) by mouth daily with breakfast. 30 tablet 1  . furosemide (LASIX) 40 MG tablet Take 2 tablets (80 mg total) by mouth daily. 75 tablet 0  . gabapentin (NEURONTIN) 300 MG capsule Take 1 capsule (300 mg total) by mouth 3 (three) times daily. 90 capsule 1  . hydrocerin  (EUCERIN) CREA Apply 1 application topically 2 (two) times daily.  0  . metoprolol tartrate (LOPRESSOR) 50 MG tablet Take 1 tablet (50 mg total) by mouth 2 (two) times daily. 60 tablet 1  . MYRBETRIQ 50 MG TB24 tablet TAKE 1 TABLET BY MOUTH EVERY DAY (Patient taking differently: Take 50 mg by mouth daily. ) 30 tablet 3  . pantoprazole (PROTONIX) 40 MG tablet Take 40 mg by mouth daily.    . potassium chloride SA (K-DUR) 20 MEQ tablet Take 1 tablet (20 mEq total) by mouth daily. 30 tablet 1  . rosuvastatin (CRESTOR) 40 MG tablet Take 1 tablet (40 mg total) by mouth daily. 90 tablet 2  . sertraline (ZOLOFT) 100 MG tablet Take 200 mg by mouth daily.     . Tamsulosin HCl (FLOMAX) 0.4 MG CAPS Take 0.4 mg by mouth at bedtime.      No current facility-administered medications for this visit.     Allergies:   Lipitor [atorvastatin], Nsaids, Warfarin and related, Allopurinol, and Enbrel [etanercept]   Social History: Social History   Socioeconomic History  . Marital status: Married    Spouse name: [name]  . Number of children: 2  . Years of education: Not on file  . Highest education level: Not on file  Occupational History  . Occupation: Retired- IT trainer  Social Needs  . Financial resource strain: Not on file  . Food insecurity    Worry: Not on file    Inability: Not on file  . Transportation needs    Medical: Not on file    Non-medical: Not on file  Tobacco Use  . Smoking status: Former Smoker    Quit date: 02/21/1991    Years since quitting: 28.3  . Smokeless tobacco: Never Used  Substance and Sexual Activity  . Alcohol use: No    Alcohol/week: 0.0 standard drinks  . Drug use: No  . Sexual activity: Not Currently  Lifestyle  . Physical activity    Days per week: Not on file    Minutes per session: Not on file  . Stress: Not on file  Relationships  . Social Herbalist on phone: Not on file    Gets together: Not on file    Attends religious service: Not on file     Active member of club or organization: Not on file    Attends meetings of clubs or organizations: Not on file    Relationship status: Not on file  . Intimate partner violence    Fear of current or ex partner: Not on file    Emotionally abused: Not on file    Physically abused: Not on file    Forced sexual activity: Not on file  Other Topics Concern  . Not on file  Social History Narrative   Lives with wife  Right handed    Married with two children.     He is a Engineer, structural.      Family History: Family History  Problem Relation Age of Onset  . Heart disease Father   . Heart attack Father   . Heart failure Father   . Heart disease Mother   . Alzheimer's disease Mother   . Dementia Mother      Review of Systems: All other systems reviewed and are otherwise negative except as noted above.   Physical Exam: VS:  BP 106/60   Ht 5' 8.5" (1.74 m)   Wt 176 lb 3.2 oz (79.9 kg)   BMI 26.40 kg/m  , BMI Body mass index is 26.4 kg/m.  GEN- The patient is elderly appearing, alert and oriented x 3 today.   HEENT: normocephalic, atraumatic; sclera clear, conjunctiva pink; hearing intact; oropharynx clear; neck supple  Lungs- Clear to ausculation bilaterally, normal work of breathing.  No wheezes, rales, rhonchi Heart- Regular rate and rhythm  GI- soft, non-tender, non-distended, bowel sounds present  Extremities- no clubbing, cyanosis, or edema  MS- no significant deformity or atrophy Skin- warm and dry, no rash or lesion; PPM pocket well healed; left foot wrapped Psych- euthymic mood, full affect Neuro- strength and sensation are intact  PPM Interrogation- reviewed in detail today,  See PACEART report  EKG:  EKG is not ordered today.  Recent Labs: 05/16/2019: B Natriuretic Peptide 289.1; TSH 2.022 05/26/2019: Magnesium 2.0 05/27/2019: ALT 23 05/29/2019: Hemoglobin 11.2; Platelets 124 06/06/2019: BUN 41; Creatinine, Ser 1.86; Potassium 3.9; Sodium 142   Wt Readings from  Last 3 Encounters:  06/11/19 176 lb 3.2 oz (79.9 kg)  06/04/19 179 lb 10.8 oz (81.5 kg)  05/26/19 185 lb (83.9 kg)     Other studies Reviewed: Additional studies/ records that were reviewed today include: office notes, hospital records    Assessment and Plan:  1.  Symptomatic bradycardia PPM at [facility] Risks, benefits to gen change reviewed with patient who wishes to proceed. Will schedule at the next available time  2.  Persistent atrial fibrillation Not a candidate for [facility] per Dr Tanna Furry notes  3.  Chronic diastolic heart failure Weight maintaining since recent discharge Continue Lasix and compression hose Low sodium diet recommended    Current medicines are reviewed at length with the patient today.   The patient does not have concerns regarding his medicines.  The following changes were made today:  none  Labs/ tests ordered today include: pre procedure labs No orders of the defined types were placed in this encounter.    Disposition:   Follow up with Dr Lovena Le after gen change     Signed, Chanetta Marshall, NP 06/11/2019 12:09 PM  [place] [address] [phone] (office) [phone] (fax)

## 2019-06-11 ENCOUNTER — Ambulatory Visit (INDEPENDENT_AMBULATORY_CARE_PROVIDER_SITE_OTHER): Payer: Medicare Other | Admitting: Nurse Practitioner

## 2019-06-11 ENCOUNTER — Encounter: Payer: Self-pay | Admitting: Nurse Practitioner

## 2019-06-11 ENCOUNTER — Other Ambulatory Visit: Payer: Self-pay

## 2019-06-11 VITALS — BP 106/60 | Ht 68.5 in | Wt 176.2 lb

## 2019-06-11 DIAGNOSIS — I6330 Cerebral infarction due to thrombosis of unspecified cerebral artery: Secondary | ICD-10-CM

## 2019-06-11 DIAGNOSIS — I5032 Chronic diastolic (congestive) heart failure: Secondary | ICD-10-CM | POA: Diagnosis not present

## 2019-06-11 DIAGNOSIS — I4811 Longstanding persistent atrial fibrillation: Secondary | ICD-10-CM

## 2019-06-11 DIAGNOSIS — R001 Bradycardia, unspecified: Secondary | ICD-10-CM | POA: Diagnosis not present

## 2019-06-11 NOTE — Telephone Encounter (Signed)
It looks like he was from 06/08-06/17 and there are notes he is to f/u with you.

## 2019-06-11 NOTE — Patient Instructions (Addendum)
  Medication Instructions:  none If you need a refill on your cardiac medications before your next appointment, please call your pharmacy.   Lab work: none If you have labs (blood work) drawn today and your tests are completely normal, you will receive your results only by: [name] MyChart Message (if you have MyChart) OR . A paper copy in the mail If you have any lab test that is abnormal or we need to change your treatment, we will call you to review the results.  Testing/Procedures:COVID TESTING Saturday 10:05 AM                                      [place] at [place] 7/1  Follow-Up:91 DAYS WITH Dr Lovena Le from 7/1                    10-14 [place] from 7/1 At [facility] ([place]), you and your health needs are our priority.  As part of our continuing mission to provide you with exceptional heart care, we have created designated Provider Care Teams.  These Care Teams include your primary Cardiologist (physician) and Advanced Practice Providers (APPs -  Physician Assistants and Nurse Practitioners) who all work together to provide you with the care you need, when you need it. .   Any Other Special Instructions Will Be Listed Below (If Applicable). Please arrive at [facility] at 11:30 am, procedure at 2:00 pm Do not eat or drink after midnight the night prior to the procedure Do not take any medications the morning of the test Will need someone to drive you home at discharge

## 2019-06-12 ENCOUNTER — Telehealth: Payer: Self-pay

## 2019-06-12 DIAGNOSIS — I2510 Atherosclerotic heart disease of native coronary artery without angina pectoris: Secondary | ICD-10-CM | POA: Diagnosis not present

## 2019-06-12 DIAGNOSIS — R2689 Other abnormalities of gait and mobility: Secondary | ICD-10-CM | POA: Diagnosis not present

## 2019-06-12 DIAGNOSIS — F0281 Dementia in other diseases classified elsewhere with behavioral disturbance: Secondary | ICD-10-CM | POA: Diagnosis not present

## 2019-06-12 DIAGNOSIS — G3183 Dementia with Lewy bodies: Secondary | ICD-10-CM | POA: Diagnosis not present

## 2019-06-12 DIAGNOSIS — I110 Hypertensive heart disease with heart failure: Secondary | ICD-10-CM | POA: Diagnosis not present

## 2019-06-12 DIAGNOSIS — I69398 Other sequelae of cerebral infarction: Secondary | ICD-10-CM | POA: Diagnosis not present

## 2019-06-12 NOTE — Telephone Encounter (Signed)
Can you schedule them to see Dr Marlou Sa on Monday?

## 2019-06-12 NOTE — Telephone Encounter (Signed)
Please advise. Thanks.  

## 2019-06-12 NOTE — Telephone Encounter (Signed)
[name]  per dean

## 2019-06-12 NOTE — Telephone Encounter (Signed)
Patient is scheduled Monday 06/16/2019 at 3:45pm

## 2019-06-12 NOTE — Telephone Encounter (Signed)
Try bandaid over scab and rov Monday thx

## 2019-06-13 DIAGNOSIS — F0281 Dementia in other diseases classified elsewhere with behavioral disturbance: Secondary | ICD-10-CM | POA: Diagnosis not present

## 2019-06-13 DIAGNOSIS — G3183 Dementia with Lewy bodies: Secondary | ICD-10-CM | POA: Diagnosis not present

## 2019-06-13 DIAGNOSIS — I69398 Other sequelae of cerebral infarction: Secondary | ICD-10-CM | POA: Diagnosis not present

## 2019-06-13 DIAGNOSIS — I110 Hypertensive heart disease with heart failure: Secondary | ICD-10-CM | POA: Diagnosis not present

## 2019-06-13 DIAGNOSIS — R2689 Other abnormalities of gait and mobility: Secondary | ICD-10-CM | POA: Diagnosis not present

## 2019-06-13 DIAGNOSIS — I2510 Atherosclerotic heart disease of native coronary artery without angina pectoris: Secondary | ICD-10-CM | POA: Diagnosis not present

## 2019-06-14 ENCOUNTER — Other Ambulatory Visit (HOSPITAL_COMMUNITY): Payer: Medicare Other

## 2019-06-16 ENCOUNTER — Other Ambulatory Visit: Payer: Self-pay | Admitting: *Deleted

## 2019-06-16 ENCOUNTER — Ambulatory Visit (INDEPENDENT_AMBULATORY_CARE_PROVIDER_SITE_OTHER): Payer: Medicare Other | Admitting: Orthopedic Surgery

## 2019-06-16 ENCOUNTER — Other Ambulatory Visit: Payer: Self-pay

## 2019-06-16 ENCOUNTER — Encounter: Payer: Self-pay | Admitting: Orthopedic Surgery

## 2019-06-16 DIAGNOSIS — I69398 Other sequelae of cerebral infarction: Secondary | ICD-10-CM | POA: Diagnosis not present

## 2019-06-16 DIAGNOSIS — L089 Local infection of the skin and subcutaneous tissue, unspecified: Secondary | ICD-10-CM

## 2019-06-16 DIAGNOSIS — G3183 Dementia with Lewy bodies: Secondary | ICD-10-CM | POA: Diagnosis not present

## 2019-06-16 DIAGNOSIS — F0281 Dementia in other diseases classified elsewhere with behavioral disturbance: Secondary | ICD-10-CM | POA: Diagnosis not present

## 2019-06-16 DIAGNOSIS — R2689 Other abnormalities of gait and mobility: Secondary | ICD-10-CM | POA: Diagnosis not present

## 2019-06-16 DIAGNOSIS — I2510 Atherosclerotic heart disease of native coronary artery without angina pectoris: Secondary | ICD-10-CM | POA: Diagnosis not present

## 2019-06-16 DIAGNOSIS — I110 Hypertensive heart disease with heart failure: Secondary | ICD-10-CM | POA: Diagnosis not present

## 2019-06-16 MED ORDER — MUPIROCIN 2 % EX OINT: TOPICAL_OINTMENT | Freq: Two times a day (BID) | CUTANEOUS | Status: DC

## 2019-06-17 ENCOUNTER — Encounter: Payer: Self-pay | Admitting: Registered Nurse

## 2019-06-17 ENCOUNTER — Encounter: Payer: Medicare Other | Attending: Registered Nurse | Admitting: Registered Nurse

## 2019-06-17 VITALS — BP 106/64 | HR 60 | Temp 97.5°F | Ht 68.0 in | Wt 178.0 lb

## 2019-06-17 DIAGNOSIS — I5033 Acute on chronic diastolic (congestive) heart failure: Secondary | ICD-10-CM

## 2019-06-17 DIAGNOSIS — I50813 Acute on chronic right heart failure: Secondary | ICD-10-CM | POA: Diagnosis not present

## 2019-06-17 DIAGNOSIS — R5381 Other malaise: Secondary | ICD-10-CM | POA: Diagnosis not present

## 2019-06-17 DIAGNOSIS — F0281 Dementia in other diseases classified elsewhere with behavioral disturbance: Secondary | ICD-10-CM | POA: Diagnosis not present

## 2019-06-17 DIAGNOSIS — G3183 Dementia with Lewy bodies: Secondary | ICD-10-CM | POA: Insufficient documentation

## 2019-06-17 DIAGNOSIS — I6330 Cerebral infarction due to thrombosis of unspecified cerebral artery: Secondary | ICD-10-CM

## 2019-06-17 NOTE — Progress Notes (Signed)
Subjective:    Patient ID: [name]., male    DOB: Feb 26, 1941, 78 y.o.   MRN: [id number]  HPI: [name]. is a 78 y.o. male who is here for transitional care visit in follow up of his  Debility, acute on chronic right- sided heart failure, acute on chronic diastolic heart failure and lewy body dementia. He was admitted  On 05/15/2019, he was at his cardiology office with increasing shortness of breath, which has been ongoing for the last few months.   He also underwent left fourth toe amputation on 05/17/2019 by Dr. Marlou Sa.   [name] was admitted to inpatient rehabilitation on 05/26/2019 and discharged home on 06/04/2019. He is receiving [place] outpatient therapy. He denies pain. Also reports he has a good appetite.   Pain Inventory Average Pain 0 Pain Right Now 0 My pain is burning and aching  In the last 24 hours, has pain interfered with the following? General activity 0 Relation with others 0 Enjoyment of life 0 What TIME of day is your pain at its worst? n/a Sleep (in general) Good  Pain is worse with: walking and some activites Pain improves with: medication and gabapentine,  Relief from Meds: n/a  Mobility use a walker do you drive?  yes  Function not employed: date last employed 11/2010`  Neuro/Psych bladder control problems trouble walking  Prior Studies n/a  Physicians involved in your care hosp   Family History  Problem Relation Age of Onset  . Heart disease Father   . Heart attack Father   . Heart failure Father   . Heart disease Mother   . Alzheimer's disease Mother   . Dementia Mother    Social History   Socioeconomic History  . Marital status: Married    Spouse name: [name]  . Number of children: 2  . Years of education: Not on file  . Highest education level: Not on file  Occupational History  . Occupation: Retired- IT trainer  Social Needs  . Financial resource strain: Not on file  . Food insecurity     Worry: Not on file    Inability: Not on file  . Transportation needs    Medical: Not on file    Non-medical: Not on file  Tobacco Use  . Smoking status: Former Smoker    Quit date: 02/21/1991    Years since quitting: 28.3  . Smokeless tobacco: Never Used  Substance and Sexual Activity  . Alcohol use: No    Alcohol/week: 0.0 standard drinks  . Drug use: No  . Sexual activity: Not Currently  Lifestyle  . Physical activity    Days per week: Not on file    Minutes per session: Not on file  . Stress: Not on file  Relationships  . Social Herbalist on phone: Not on file    Gets together: Not on file    Attends religious service: Not on file    Active member of club or organization: Not on file    Attends meetings of clubs or organizations: Not on file    Relationship status: Not on file  Other Topics Concern  . Not on file  Social History Narrative   Lives with wife   Right handed    Married with two children.     He is a Engineer, structural.     Past Surgical History:  Procedure Laterality Date  . AMPUTATION Left 05/17/2019   Procedure:  AMPUTATION LEFT FOURTH TOE;  Surgeon: Jamie Pel, [name];  Location: [place];  Service: Orthopedics;  Laterality: Left;  . APPENDECTOMY    . CHOLECYSTECTOMY    . CORONARY ARTERY BYPASS GRAFT     x3 (left internal mammary artery to distal left anterior descending coronary artery, saphenous vain graft to second circumflex marginal branch, saphenous vain graft to posterior descending coronary artery, endoscopic saphenous vain harvest from right thigh) and modified Cox - Maze IV procedure.  [name]. [name],[name]. Electronically signed CHO/MEDQ D: 01/09/2008/ JOB: [id number] cc:  [name]  . [name]  07/30/2012   Procedure: CRANIOTOMY HEMATOMA EVACUATION SUBDURAL;  Surgeon: Jamie Hoops, [name];  Location: [place] [facility];  Service: Neurosurgery;  Laterality: Right;  Right craniotomy for evacuation of subdural hematoma  . FOOT SURGERY    .  HERNIA REPAIR    . ORCHIECTOMY     Left  /  testicular cancer  . PACEMAKER PLACEMENT     PPM - St. Jude   Past Medical History:  Diagnosis Date  . (HFpEF) heart failure with preserved ejection fraction ([place])    a. 05/2013 Echo: EF 55%, mild LVH, diast dysfxn, Ao sclerosis, mildly dil LA, RV dysfxn (poorly visualized), PASP 30mmHg;  b. 03/2017 Echo: EF 55-60%, no rwma, triv MR, mildly dil RV, mod TR, PASP 12mmHg.  . Atrial fibrillation [facility])    s/p Cox Maze 1/09;  Multaq Rx d/c'd in 2014 due to pulmo fibrosis;  coumadin d/c'd in 2014 due to spontaneous subdural hematoma  . BPH (benign prostatic hyperplasia)   . CAD (coronary artery disease), native coronary artery    a. s/p CABG 12/2007;  b. Myoview 12/2011: EF 66%, no scar or ischemia; normal.  . DM (diabetes mellitus) ([place])   . Hyperlipidemia type II   . Hypertension   . 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 OSA (obstructive sleep apnea)   . Pacemaker    PPM - St. Jude  . Peripheral neuropathy 07/31/2018  . Pulmonary fibrosis ([place])    Multaq d/c'd 7/14  . Subdural hematoma ([place]) 07/2012   spontaneous;  coumadin d/c'd => no longer a candidate for anticoagulation   BP 106/64 (BP Location: Left Arm, Patient Position: Sitting, Cuff Size: Normal)   Pulse (!) 54   Temp (!) 97.5 F (36.4 C)   Ht 5\' 8"  (1.727 m)   Wt 178 lb (80.7 kg)   SpO2 97%   BMI 27.06 kg/m   Opioid Risk Score:   Fall Risk Score:  `1  Depression screen PHQ 2/9  No flowsheet data found.   Review of Systems  Constitutional:       Easy bleeding  Respiratory: Positive for apnea.   All other systems reviewed and are negative.      Objective:   Physical Exam Vitals signs and nursing note reviewed.  Constitutional:      Appearance: Normal appearance.  Neck:     Musculoskeletal: Normal range of motion and neck supple.  Cardiovascular:     Rate and Rhythm: Normal rate and regular rhythm.     Pulses: Normal pulses.      Heart sounds: Normal heart sounds.  Pulmonary:     Effort: Pulmonary effort is normal.     Breath sounds: Normal breath sounds.  Musculoskeletal:     Comments: Normal Muscle Bulk and Muscle Testing Reveals:  Upper Extremities: Full ROM and Muscle Strength 5/5 Lower Extremities: Full ROM an Muscle  Strength 5/5 Arises from Table with ease using walker for support Narrow Based Gait     Skin:    General: Skin is warm and dry.  Neurological:     Mental Status: He is alert and oriented to person, place, and time.  Psychiatric:        Mood and Affect: Mood normal.        Behavior: Behavior normal.           Assessment & Plan:  1. Debility: Continue Outpatient Therapy. Continue to Monitor.  2. Acute on Chronic Right- Sided Heart Failure: Cardiology Following. Continue to Monitor.  3. Acute on Chronic Diastolic Heart Failure: Cardiology Following. Continue to Monitor.  3. Lewy Body Dementia: PCP Following. Continue to monitor.   20 minutes of face to face patient care time was spent during this visit. All questions were encouraged and answered.  F/U in 4-6 weeks with Dr. Letta Pate

## 2019-06-18 ENCOUNTER — Telehealth: Payer: Self-pay | Admitting: Cardiology

## 2019-06-18 ENCOUNTER — Encounter: Payer: Self-pay | Admitting: Orthopedic Surgery

## 2019-06-18 DIAGNOSIS — G3183 Dementia with Lewy bodies: Secondary | ICD-10-CM | POA: Diagnosis not present

## 2019-06-18 DIAGNOSIS — I69398 Other sequelae of cerebral infarction: Secondary | ICD-10-CM | POA: Diagnosis not present

## 2019-06-18 DIAGNOSIS — I110 Hypertensive heart disease with heart failure: Secondary | ICD-10-CM | POA: Diagnosis not present

## 2019-06-18 DIAGNOSIS — I2510 Atherosclerotic heart disease of native coronary artery without angina pectoris: Secondary | ICD-10-CM | POA: Diagnosis not present

## 2019-06-18 DIAGNOSIS — R2689 Other abnormalities of gait and mobility: Secondary | ICD-10-CM | POA: Diagnosis not present

## 2019-06-18 DIAGNOSIS — F0281 Dementia in other diseases classified elsewhere with behavioral disturbance: Secondary | ICD-10-CM | POA: Diagnosis not present

## 2019-06-18 NOTE — Telephone Encounter (Signed)
Agree with fu as scheduled Kirk Ruths

## 2019-06-18 NOTE — Progress Notes (Signed)
Post-Op Visit Note   Patient: [name].           Date of Birth: [birth year]           MRN: [id number] Visit Date: 06/16/2019 PCP: [name], MD   Assessment & Plan:  Chief Complaint: No chief complaint on file.  Visit Diagnoses:  1. Toe infection     Plan: Patient presents now about a month out from left fourth toe amputation.  He has been having some granulation tissue form on that toe.  That is removed today.  That granulation tissue was actually rubbing a few spots on the third and fifth toe.  He is not having any pain.  The toe itself is healed up.  When a put some Bactroban ointment on that toe with a Band-Aid.  He would like to get his toenails cut by a podiatrist.  We do have Dr. [name] and his team that can address that.  I will send him over to Dr. do to get that toenail issue addressed.  I will see him back as needed if this toe has any more issues but I think based on everything in general with this patient he may be heading for more surgery on that foot which Dr. [name] would be eminently qualified to handle..  Follow-Up Instructions: Return if symptoms worsen or fail to improve.   Orders:  No orders of the defined types were placed in this encounter.  Meds ordered this encounter  Medications  . mupirocin ointment (BACTROBAN) 2 %    Imaging: No results found.  PMFS History: Patient Active Problem List   Diagnosis Date Noted  . Acute on chronic diastolic heart failure ([place]) 06/03/2019  . Coronary artery disease involving native heart without angina pectoris 06/03/2019  . Debility 05/26/2019  . 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 Toe infection 05/17/2019  . DOE (dyspnea on exertion) 05/15/2019  . Severe tricuspid regurgitation 05/15/2019  . Acute on chronic right-sided heart failure ([place]) 05/15/2019  . Cellulitis of fourth toe of left foot   . Excessive daytime sleepiness 02/11/2019  . Lewy body dementia with behavioral  disturbance ([place]) 02/11/2019  . Iron deficiency anemia 02/04/2019  . Poor memory 12/16/2018  . Deficiency anemia 11/19/2018  . Other fatigue 11/19/2018  . History of elevated PSA 11/19/2018  . Prostate cancer screening 11/19/2018  . Prostate CA ([place]) 11/19/2018  . Pancytopenia, acquired ([place]) 08/13/2018  . Recurrent falls 08/13/2018  . 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 Peripheral neuropathy 07/31/2018  . 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 Anemia, chronic disease 01/28/2018  . History of skin cancer 07/27/2017  . Fatty liver disease, nonalcoholic 90/30/0923  . Thrombocytopenia ([place]) 11/27/2016  . Lung nodule 04/23/2014  . Postinflammatory pulmonary fibrosis ([place]) 06/13/2013  . Long term (current) use of anticoagulants 11/22/2011  . Abdominal bruit 03/07/2011  . Pacemaker-St.Jude   . Hx of CABG   . Hyperlipidemia type II   . OSA (obstructive sleep apnea)   . PACEMAKER, PERMANENT 08/17/2010  . DIABETES MELLITUS 08/16/2010  . HYPERLIPIDEMIA 08/16/2010  . Obesity 08/16/2010  . Essential hypertension 08/16/2010  . Atrial fibrillation ([place]) 08/16/2010  . IRREGULAR HEART RATE 08/16/2010  . Sleep apnea 08/16/2010  . BENIGN PROSTATIC HYPERTROPHY, HX OF 08/16/2010   Past Medical History:  Diagnosis Date  . (HFpEF) heart failure with preserved ejection fraction ([place])    a. 05/2013 Echo: EF 55%, mild LVH, diast dysfxn, Ao  sclerosis, mildly dil LA, RV dysfxn (poorly visualized), PASP 58mmHg;  b. 03/2017 Echo: EF 55-60%, no rwma, triv MR, mildly dil RV, mod TR, PASP 21mmHg.  . Atrial fibrillation [facility])    s/p Cox Maze 1/09;  Multaq Rx d/c'd in 2014 due to pulmo fibrosis;  coumadin d/c'd in 2014 due to spontaneous subdural hematoma  . BPH (benign prostatic hyperplasia)   . CAD (coronary artery disease), native coronary artery    a. s/p CABG 12/2007;  b. Myoview 12/2011: EF 66%, no scar or ischemia; normal.  . DM (diabetes mellitus) ([place])   .  Hyperlipidemia type II   . Hypertension   . 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 OSA (obstructive sleep apnea)   . Pacemaker    PPM - St. Jude  . Peripheral neuropathy 07/31/2018  . 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)    Multaq d/c'd 7/14  . Subdural hematoma ([place]) 07/2012   spontaneous;  coumadin d/c'd => no longer a candidate for anticoagulation    Family History  Problem Relation Age of Onset  . Heart disease Father   . Heart attack Father   . Heart failure Father   . Heart disease Mother   . Alzheimer's disease Mother   . Dementia Mother     Past Surgical History:  Procedure Laterality Date  . AMPUTATION Left 05/17/2019   Procedure: AMPUTATION LEFT FOURTH TOE;  Surgeon: Meredith Pel, MD;  Location: [place];  Service: Orthopedics;  Laterality: Left;  . APPENDECTOMY    . CHOLECYSTECTOMY    . CORONARY ARTERY BYPASS GRAFT     x3 (left internal mammary artery to distal left anterior descending coronary artery, saphenous vain graft to second circumflex marginal branch, saphenous vain graft to posterior descending coronary artery, endoscopic saphenous vain harvest from right thigh) and modified Cox - Maze IV procedure.  [name],MD. Electronically signed CHO/MEDQ D: 01/09/2008/ JOB: [id number] cc:  [name]  . [name]  07/30/2012   Procedure: CRANIOTOMY HEMATOMA EVACUATION SUBDURAL;  Surgeon: [name], MD;  Location: [place] [facility];  Service: Neurosurgery;  Laterality: Right;  Right craniotomy for evacuation of subdural hematoma  . FOOT SURGERY    . HERNIA REPAIR    . ORCHIECTOMY     Left  /  testicular cancer  . PACEMAKER PLACEMENT     PPM - St. Jude   Social History   Occupational History  . Occupation: Retired- IT trainer  Tobacco Use  . Smoking status: Former Smoker    Quit date: 02/21/1991    Years since quitting: 28.3  . Smokeless tobacco: Never Used  Substance and Sexual Activity  . Alcohol use: No    Alcohol/week: 0.0  standard drinks  . Drug use: No  . Sexual activity: Not Currently

## 2019-06-18 NOTE — Telephone Encounter (Signed)
Spoke with [name], HHN, patient bp is running 100 to high 90"s. His weight was up 5 lbs today from last week and they were instructed to give him the extra fluid pill today. He is not having any lightheadedness or other problems. He has an appointment Monday 06/23/2019 with dr [name]. Medications confirmed. Will forward for dr [name] review

## 2019-06-18 NOTE — Progress Notes (Signed)
HPI: FU CAD and atrial fibrillation. He underwent CABG (LIMA-LAD, SVG-OM 2, SVG-PDA) along with modified Cox Maze IV procedure in 12/2007. 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 Abdominal US 3/12: No aneurysm. 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 He underwent craniotomy and evacuation by Dr. Saintclair Halsted. He was taken off of Coumadin and no longer felt to be an anticoagulation candidate.Multaq DCed previously as felt causing pulmonary toxixity. Nuclear study 5/18 showed EF 59 with normal perfusion.Carotid Dopplers January 2019 showed 1 to 39% bilateral stenosis. There was greater than 50% right external carotid artery stenosis.Patient presently being evaluated for possible dementia and also pancytopenia and monoclonal antibody IgA. At last office visit patient was volume overloaded and we increased his diuretic.   Chest x-ray May 2020 showed chronic interstitial lung disease likely pulmonary fibrosis. Echocardiogram May 2020 showed ejection fraction 50 to 55%, moderate right ventricular enlargement, mildly reduced RV function, severe tricuspid regurgitation.   ABIs June 2020 normal.  Carotid Dopplers June 2020 showed 1 to 39% bilateral stenosis.  Transcranial Dopplers June 2020 negative.  Patient admitted June 2020 with congestive heart failure and diuresed.  Since last seen,he denies dyspnea, chest pain or syncope.  He has gained 4 to 5 pounds over the past 2 weeks with increased lower extremity edema.  He is having difficulties falling due to imbalance.  Current Outpatient Medications  Medication Sig Dispense Refill  . acetaminophen (TYLENOL) 325 MG tablet Take 1-2 tablets (325-650 mg total) by mouth every 4 (four) hours as needed for mild pain.    [name] allopurinol (ZYLOPRIM) 300 MG tablet Take 300 mg by mouth daily.     [name] aspirin 81 MG EC tablet Take 1 tablet (81 mg total) by mouth daily.  0  . donepezil (ARICEPT) 5 MG  tablet Take 1 tablet (5 mg total) by mouth at bedtime. 30 tablet 1  . ferrous sulfate 325 (65 FE) MG tablet Take 1 tablet (325 mg total) by mouth daily with breakfast. 30 tablet 1  . furosemide (LASIX) 40 MG tablet Take 2 tablets (80 mg total) by mouth daily. 75 tablet 0  . gabapentin (NEURONTIN) 300 MG capsule Take 1 capsule (300 mg total) by mouth 3 (three) times daily. 90 capsule 1  . hydrocerin (EUCERIN) CREA Apply 1 application topically 2 (two) times daily.  0  . metoprolol tartrate (LOPRESSOR) 50 MG tablet Take 1 tablet (50 mg total) by mouth 2 (two) times daily. 60 tablet 1  . MYRBETRIQ 50 MG TB24 tablet TAKE 1 TABLET BY MOUTH EVERY DAY (Patient taking differently: Take 50 mg by mouth daily. ) 30 tablet 3  . neomycin-bacitracin-polymyxin (NEOSPORIN) ointment Apply 1 application topically daily.    . pantoprazole (PROTONIX) 40 MG tablet Take 40 mg by mouth daily.    . potassium chloride SA (K-DUR) 20 MEQ tablet Take 1 tablet (20 mEq total) by mouth daily. 30 tablet 1  . rosuvastatin (CRESTOR) 40 MG tablet Take 1 tablet (40 mg total) by mouth daily. (Patient taking differently: Take 40 mg by mouth every evening. ) 90 tablet 2  . sertraline (ZOLOFT) 100 MG tablet Take 200 mg by mouth daily.     . Tamsulosin HCl (FLOMAX) 0.4 MG CAPS Take 0.4 mg by mouth at bedtime.      Current Facility-Administered Medications  Medication Dose Route Frequency Provider Last Rate Last Dose  . mupirocin ointment (BACTROBAN) 2 %   Topical BID Magnant, Gerrianne Scale, PA-C  Past Medical History:  Diagnosis Date  . (HFpEF) heart failure with preserved ejection fraction ([place])    a. 05/2013 Echo: EF 55%, mild LVH, diast dysfxn, Ao sclerosis, mildly dil LA, RV dysfxn (poorly visualized), PASP 45mmHg;  b. 03/2017 Echo: EF 55-60%, no rwma, triv MR, mildly dil RV, mod TR, PASP 3mmHg.  . Atrial fibrillation [facility])    s/p Cox Maze 1/09;  Multaq Rx d/c'd in 2014 due to pulmo fibrosis;  coumadin d/c'd in 2014 due to  spontaneous subdural hematoma  . BPH (benign prostatic hyperplasia)   . CAD (coronary artery disease), native coronary artery    a. s/p CABG 12/2007;  b. Myoview 12/2011: EF 66%, no scar or ischemia; normal.  . DM (diabetes mellitus) ([place])   . Hyperlipidemia type II   . Hypertension   . 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 OSA (obstructive sleep apnea)   . Pacemaker    PPM - St. Jude  . Peripheral neuropathy 07/31/2018  . 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 Subdural hematoma ([place]) 07/2012   spontaneous;  coumadin d/c'd => no longer a candidate for anticoagulation    Past Surgical History:  Procedure Laterality Date  . AMPUTATION Left 05/17/2019   Procedure: AMPUTATION LEFT FOURTH TOE;  Surgeon: Meredith Pel, MD;  Location: [place];  Service: Orthopedics;  Laterality: Left;  . APPENDECTOMY    . CHOLECYSTECTOMY    . CORONARY ARTERY BYPASS GRAFT     x3 (left internal mammary artery to distal left anterior descending coronary artery, saphenous vain graft to second circumflex marginal branch, saphenous vain graft to posterior descending coronary artery, endoscopic saphenous vain harvest from right thigh) and modified Cox - Maze IV procedure.  [name],MD. Electronically signed CHO/MEDQ D: 01/09/2008/ JOB: [id number] cc:  [name]  . [name]  07/30/2012   Procedure: CRANIOTOMY HEMATOMA EVACUATION SUBDURAL;  Surgeon: [name], MD;  Location: [place] [facility];  Service: Neurosurgery;  Laterality: Right;  Right craniotomy for evacuation of subdural hematoma  . FOOT SURGERY    . HERNIA REPAIR    . ORCHIECTOMY     Left  /  testicular cancer  . PACEMAKER PLACEMENT     PPM - St. Jude    Social History   Socioeconomic History  . Marital status: Married    Spouse name: [name]  . Number of children: 2  . Years of education: Not on file  . Highest education level: Not on file  Occupational History  . Occupation: Retired- Engineer, agricultural  Social Needs  . Financial resource strain: Not on file  . Food insecurity    Worry: Not on file    Inability: Not on file  . Transportation needs    Medical: Not on file    Non-medical: Not on file  Tobacco Use  . Smoking status: Former Smoker    Quit date: 02/21/1991    Years since quitting: 28.3  . Smokeless tobacco: Never Used  Substance and Sexual Activity  . Alcohol use: No    Alcohol/week: 0.0 standard drinks  . Drug use: No  . Sexual activity: Not Currently  Lifestyle  . Physical activity    Days per week: Not on file    Minutes per session: Not on file  . Stress: Not on file  Relationships  . Social Herbalist on phone: Not on file    Gets together:  Not on file    Attends religious service: Not on file    Active member of club or organization: Not on file    Attends meetings of clubs or organizations: Not on file    Relationship status: Not on file  . Intimate partner violence    Fear of current or ex partner: Not on file    Emotionally abused: Not on file    Physically abused: Not on file    Forced sexual activity: Not on file  Other Topics Concern  . Not on file  Social History Narrative   Lives with wife   Right handed    Married with two children.     He is a Engineer, structural.      Family History  Problem Relation Age of Onset  . Heart disease Father   . Heart attack Father   . Heart failure Father   . Heart disease Mother   . Alzheimer's disease Mother   . Dementia Mother     ROS: no fevers or chills, productive cough, hemoptysis, dysphasia, odynophagia, melena, hematochezia, dysuria, hematuria, rash, seizure activity, orthopnea, PND, claudication. Remaining systems are negative.  Physical Exam: Well-developed well-nourished in no acute distress.  Skin is warm and dry.  HEENT is normal.  Neck is supple.  Chest is clear to auscultation with normal expansion.  Cardiovascular exam is regular rate and rhythm.  Abdominal exam  nontender or distended. No masses palpated. Extremities show 1+ edema. neuro grossly intact  A/P  1 chronic right-sided heart failure-this is felt to be multifactorial including pulmonary venous hypertension, sleep apnea and pulmonary fibrosis. Continue fluid restriction and low-sodium diet.  He is mildly volume overloaded today.  Discontinue Lasix.  Treat with Demadex 20 mg twice daily.  Potassium and renal function in 1 week.  2 paroxysmal atrial fibrillation-previously in sinus rhythm.  Continue beta-blocker at present dose for rate control if atrial fibrillation recurs.  He is not a candidate for anticoagulation given history of spontaneous subdural hematoma.  3 severe tricuspid regurgitation-possibly related to prior pacemaker vs RV enlargement from pulmonary hypertension.  Not clear that he would be a candidate for surgical intervention.  4 prior pacemaker-followed by electrophysiology.  Device at [facility].  Generator change scheduled in the near future.  5 cirrhosis-follow-up primary care.  6 anemia/thrombocytopenia-this is chronic and felt secondary to cirrhosis/splenomegaly as well as anemia of chronic disease/renal insufficiency.  Follow-up hematology.  7 coronary artery disease-continue statin.  He is not on aspirin given history of spontaneous intracranial hemorrhage.  8 hypertension-patient's blood pressure is controlled.  Continue present medications and follow.  9 hyperlipidemia-continue statin.  10 pulmonary fibrosis on chest x-ray-we will arrange evaluation with pulmonary to further assess.  Kirk Ruths, MD

## 2019-06-18 NOTE — Telephone Encounter (Signed)
Beth made aware no change at this time.

## 2019-06-18 NOTE — Telephone Encounter (Signed)
Pt c/o BP issue: STAT if pt c/o blurred vision, one-sided weakness or slurred speech  1. What are your last 5 BP readings?  Today it was 96/60, 94/58 and 100/60  2. Are you having any other symptoms (ex. Dizziness, headache, blurred vision, passed out)? no  3. What is your BP issue? Blood Pressure is running low and heart rate is running in the 50's   [facility] Nurse, called to report Patient blood pressure is running low.

## 2019-06-19 ENCOUNTER — Encounter: Payer: Self-pay | Admitting: Neurology

## 2019-06-19 ENCOUNTER — Ambulatory Visit (INDEPENDENT_AMBULATORY_CARE_PROVIDER_SITE_OTHER): Payer: Medicare Other | Admitting: Neurology

## 2019-06-19 ENCOUNTER — Telehealth: Payer: Self-pay | Admitting: Cardiology

## 2019-06-19 ENCOUNTER — Other Ambulatory Visit: Payer: Self-pay

## 2019-06-19 VITALS — Temp 96.6°F | Ht 68.0 in | Wt 182.0 lb

## 2019-06-19 DIAGNOSIS — R413 Other amnesia: Secondary | ICD-10-CM

## 2019-06-19 DIAGNOSIS — G603 Idiopathic progressive neuropathy: Secondary | ICD-10-CM | POA: Diagnosis not present

## 2019-06-19 DIAGNOSIS — I6330 Cerebral infarction due to thrombosis of unspecified cerebral artery: Secondary | ICD-10-CM

## 2019-06-19 MED ORDER — DONEPEZIL HCL 5 MG PO TABS: 5.0000 mg | ORAL_TABLET | Freq: Every day | ORAL | 1 refills | Status: DC

## 2019-06-19 NOTE — Progress Notes (Addendum)
Reason for visit: Memory disturbance, peripheral neuropathy, gait disturbance  Jamie G Aron Inge. is an 78 y.o. 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  The patient was recently in the hospital in late 15 May 2019 with exacerbation of congestive heart failure.  During that admission, the patient had a transient event of double vision and possibly some slurred speech.  The patient was felt to have had a TIA type event and underwent a CT scan of the brain that did not show any acute stroke event.  He cannot have MRI secondary to pacemaker placement.  A transcranial Doppler study was unremarkable as was a carotid Doppler study.  The patient does have a history of atrial fibrillation but he cannot be anticoagulated secondary to prior history of intracranial hemorrhage.  A 2D echocardiogram showed an ejection fraction of 50 to 55%.  Some thickening of the mitral valve is noted.  The patient has been placed on aspirin therapy.  He underwent some physical therapy following the hospitalization.  He walks with a walker, he has had 1 fall when he had hypotension associated with aggressive diuretic therapy.  He has not had any further falls.  The patient is now on Myrbetriq for the bladder, he feels that this works fairly well, he gets up only once at night to use the bathroom.  Past Medical History:  Diagnosis Date  . (HFpEF) heart failure with preserved ejection fraction ([place])    a. 05/2013 Echo: EF 55%, mild LVH, diast dysfxn, Ao sclerosis, mildly dil LA, RV dysfxn (poorly visualized), PASP 10mmHg;  b. 03/2017 Echo: EF 55-60%, no rwma, triv MR, mildly dil RV, mod TR, PASP 28mmHg.  . Atrial fibrillation [facility])    s/p Cox Maze 1/09;  Multaq Rx d/c'd in 2014 due to pulmo fibrosis;  coumadin d/c'd in 2014 due  to spontaneous subdural hematoma  . BPH (benign prostatic hyperplasia)   . CAD (coronary artery disease), native coronary artery    a. s/p CABG 12/2007;  b. Myoview 12/2011: EF 66%, no scar or ischemia; normal.  . DM (diabetes mellitus) ([place])   . Hyperlipidemia type II   . Hypertension   . 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 OSA (obstructive sleep apnea)   . Pacemaker    PPM - St. Jude  . Peripheral neuropathy 07/31/2018  . Pulmonary fibrosis ([place])    Multaq d/c'd 7/14  . Subdural hematoma ([place]) 07/2012   spontaneous;  coumadin d/c'd => no longer a candidate for anticoagulation    Past Surgical History:  Procedure Laterality Date  . AMPUTATION Left 05/17/2019   Procedure: AMPUTATION LEFT FOURTH TOE;  Surgeon: Meredith Pel, MD;  Location: [place];  Service: Orthopedics;  Laterality: Left;  . APPENDECTOMY    . CHOLECYSTECTOMY    . CORONARY ARTERY BYPASS GRAFT     x3 (left internal mammary artery to distal left anterior descending coronary artery, saphenous vain graft to second circumflex marginal branch, saphenous vain graft to posterior descending coronary artery, endoscopic saphenous vain harvest from right thigh) and modified Cox - Maze IV procedure.  [name],MD. Electronically signed CHO/MEDQ D: 01/09/2008/ JOB: [id number] cc:  [name]  . CRANIOTOMY  07/30/2012   Procedure: CRANIOTOMY HEMATOMA EVACUATION SUBDURAL;  Surgeon: Dominica Severin  Levy Sjogren, MD;  Location: [place] NEURO ORS;  Service: Neurosurgery;  Laterality: Right;  Right craniotomy for evacuation of subdural hematoma  . FOOT SURGERY    . HERNIA REPAIR    . ORCHIECTOMY     Left  /  testicular cancer  . PACEMAKER PLACEMENT     PPM - St. Jude    Family History  Problem Relation Age of Onset  . Heart disease Father   . Heart attack Father   . Heart failure Father   . Heart disease Mother   . Alzheimer's disease Mother   . Dementia Mother     Social history:  reports that he  quit smoking about 28 years ago. He has never used smokeless tobacco. He reports that he does not drink alcohol or use drugs.    Allergies  Allergen Reactions  . Lipitor [Atorvastatin] Other (See Comments)    Stiff joints  . 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 Warfarin And Related Other (See Comments)    Stiff joints  . Enbrel [Etanercept] Itching    Medications:  Prior to Admission medications   Medication Sig Start Date End Date Taking? Authorizing Provider  acetaminophen (TYLENOL) 325 MG tablet Take 1-2 tablets (325-650 mg total) by mouth every 4 (four) hours as needed for mild pain. 05/28/19  Yes Love, Ivan Anchors, PA-C  allopurinol (ZYLOPRIM) 300 MG tablet Take 300 mg by mouth daily.  06/21/16  Yes [provider]  aspirin 81 MG EC tablet Take 1 tablet (81 mg total) by mouth daily. 05/28/19  Yes Love, Ivan Anchors, PA-C  ferrous sulfate 325 (65 FE) MG tablet Take 1 tablet (325 mg total) by mouth daily with breakfast. 06/04/19  Yes Love, Ivan Anchors, PA-C  furosemide (LASIX) 40 MG tablet Take 2 tablets (80 mg total) by mouth daily. 06/04/19  Yes Love, Ivan Anchors, PA-C  gabapentin (NEURONTIN) 300 MG capsule Take 1 capsule (300 mg total) by mouth 3 (three) times daily. 06/04/19  Yes Love, Ivan Anchors, PA-C  hydrocerin (EUCERIN) CREA Apply 1 application topically 2 (two) times daily. 06/04/19  Yes Love, Ivan Anchors, PA-C  metoprolol tartrate (LOPRESSOR) 50 MG tablet Take 1 tablet (50 mg total) by mouth 2 (two) times daily. 06/04/19  Yes Love, Ivan Anchors, PA-C  MYRBETRIQ 50 MG TB24 tablet TAKE 1 TABLET BY MOUTH EVERY DAY Patient taking differently: Take 50 mg by mouth daily.  04/21/19  Yes Kathrynn Ducking, MD  neomycin-bacitracin-polymyxin (NEOSPORIN) ointment Apply 1 application topically daily.   Yes [provider]  pantoprazole (PROTONIX) 40 MG tablet Take 40 mg by mouth daily. 02/22/17  Yes [provider]  potassium chloride SA (K-DUR) 20 MEQ tablet Take 1  tablet (20 mEq total) by mouth daily. 06/04/19  Yes Love, Ivan Anchors, PA-C  rosuvastatin (CRESTOR) 40 MG tablet Take 1 tablet (40 mg total) by mouth daily. Patient taking differently: Take 40 mg by mouth every evening.  12/25/18  Yes [name], MD  sertraline (ZOLOFT) 100 MG tablet Take 200 mg by mouth daily.    Yes [provider]  Tamsulosin HCl (FLOMAX) 0.4 MG CAPS Take 0.4 mg by mouth at bedtime.    Yes [provider]    ROS:  Out of a complete 14 system review of symptoms, the patient complains only of the following symptoms, and all other reviewed systems are negative.  Memory disturbance Walking problems Occasional hallucinations  Temperature (!) 96.6  F (35.9 C), height 5\' 8"  (1.727 m), weight 182 lb (82.6 kg).  Physical Exam  General: The patient is alert and cooperative at the time of the examination.   Cardiovascular: Occasionally irregular heart rhythm, no obvious murmurs or rubs are noted.  Eyes: Pupils are equal, round, and reactive to light.  Discs are flat bilaterally.  Neck: Neck is supple, no carotid bruits are noted.  Respiratory: Lung fields are clear.  Skin: 1+ edema at ankles is noted bilaterally..   Neurologic Exam  Mental status: The patient is alert and oriented x 3 at the time of the examination. The Mini-Mental status examination done today shows a total score 27/30.   Cranial nerves: Facial symmetry is present. Speech is normal, no aphasia or dysarthria is noted. Extraocular movements are full. Visual fields are full.  Motor: The patient has good strength in all 4 extremities, with exception of mild bilateral foot drops.  Sensory examination: Soft touch sensation is symmetric on the face, arms, and legs.  The patient does have a stocking pattern pinprick sensory deficit up to the knees bilaterally.  Vibration sensation is significant impaired at the ankles.  Coordination: The patient has good finger-nose-finger and  heel-to-shin bilaterally.  Gait and station: The patient has a wide-based gait, uses a walker for ambulation.  Tandem gait was not attempted.  Romberg is negative but is unsteady.  Reflexes: Deep tendon reflexes are symmetric, but are depressed.   CT brain 05/21/19:  IMPRESSION: Stable and negative for age noncontrast CT appearance of the brain.  * CT scan images were reviewed online. I agree with the written report.   2D echo 05/17/19:  IMPRESSIONS    1. The left ventricle has low normal systolic function, with an ejection fraction of 50-55%. The cavity size was normal. Left ventricular diastolic Doppler parameters are indeterminate.  2. LVEF is approximately 50 to 55% with septal hypokinesis. Poor.  3. The right ventricle was not well visualized. The cavity was moderately enlarged. There is no increase in right ventricular wall thickness.  4. RV is difficult to visualize, even with Definity It appears dilated and function is at least mildly reduced.  5. The mitral valve is abnormal. Mild thickening of the mitral valve leaflet.  6. The tricuspid valve is grossly normal. Tricuspid valve regurgitation is severe.  7. The aortic valve was not well visualized. Mild thickening of the aortic valve. Mild calcification of the aortic valve.  8. The inferior vena cava was dilated in size with <50% respiratory variability.  9. The interatrial septum was not assessed.   Carotid doppler 05/22/19:  Summary: Right Carotid: Velocities in the right ICA are consistent with a 1-39% stenosis.  Left Carotid: Velocities in the left ICA are consistent with a 1-39% stenosis.  Vertebrals: Bilateral vertebral arteries demonstrate antegrade flow.   Transcranial doppler 05/22/19:  Summary: This was a normal transcranial Doppler study, with normal flow direction and velocity of all identified vessels of the anterior and posterior circulations, with no evidence of stenosis, vasospasm or occlusion. There  was no evidence of intracranial  disease.    Assessment/Plan:  1.  Peripheral neuropathy  2.  Gait disturbance  3.  Memory disturbance  4.  IgA monoclonal antibody, myelodysplasia  5.  Recent possible TIA event, double vision  The patient is currently on aspirin therapy.  He seems to have returned to his baseline.  We will place him on Aricept for his memory, starting at 5 mg at night, they will  call after 30 days if he is tolerating the drug and we will go to the 10 mg dosing.  He will follow-up otherwise in 6 months.  Jill Alexanders MD 06/19/2019 5:10 PM  [facility] [address] [place], [place] [address]  Phone [phone] Fax [phone]

## 2019-06-19 NOTE — Patient Instructions (Signed)
We will start aricept 5 mg at night.  Begin Aricept (donepezil) at 5 mg at night for one month. If this medication is well-tolerated, please call our office and we will call in a prescription for the 10 mg tablets. Look out for side effects that may include nausea, diarrhea, weight loss, or stomach cramps. This medication will also cause a runny nose, therefore there is no need for allergy medications for this purpose.

## 2019-06-19 NOTE — Telephone Encounter (Signed)
° ° °  COVID-19 Pre-Screening Questions:   In the past 7 to 10 days have you had a cough,  shortness of breath, headache, congestion, fever (100 or greater) body aches, chills, sore throat, or sudden loss of taste or sense of smell? no  Have you been around anyone with known Covid 19.- no  Have you been around anyone who is awaiting Covid 19 test results in the past 7 to 10 days?- no  Have you been around anyone who has been exposed to Covid 19, or has mentioned symptoms of Covid 19 within the past 7 to 10 days? no  If you have any concerns/questions about symptoms patients report during screening (either on the phone or at threshold). Contact the provider seeing the patient or DOD for further guidance.  If neither are available contact a member of the leadership team.   I called pt to confirm 06-23-19 appt with Dr [name].

## 2019-06-23 ENCOUNTER — Other Ambulatory Visit: Payer: Self-pay

## 2019-06-23 ENCOUNTER — Encounter: Payer: Self-pay | Admitting: Physical Medicine and Rehabilitation

## 2019-06-23 ENCOUNTER — Other Ambulatory Visit (HOSPITAL_COMMUNITY)
Admission: RE | Admit: 2019-06-23 | Discharge: 2019-06-23 | Disposition: A | Discharge status: Home / Self Care | Payer: Medicare Other | Source: Ambulatory Visit | Attending: Internal Medicine | Admitting: Internal Medicine

## 2019-06-23 ENCOUNTER — Ambulatory Visit (INDEPENDENT_AMBULATORY_CARE_PROVIDER_SITE_OTHER): Payer: Medicare Other | Admitting: Cardiology

## 2019-06-23 ENCOUNTER — Encounter: Payer: Self-pay | Admitting: Cardiology

## 2019-06-23 VITALS — BP 110/68 | HR 60 | Temp 97.3°F | Ht 68.0 in | Wt 184.0 lb

## 2019-06-23 DIAGNOSIS — G3183 Dementia with Lewy bodies: Secondary | ICD-10-CM | POA: Diagnosis not present

## 2019-06-23 DIAGNOSIS — Z7982 Long term (current) use of aspirin: Secondary | ICD-10-CM

## 2019-06-23 DIAGNOSIS — R2689 Other abnormalities of gait and mobility: Secondary | ICD-10-CM | POA: Diagnosis not present

## 2019-06-23 DIAGNOSIS — I5032 Chronic diastolic (congestive) heart failure: Secondary | ICD-10-CM

## 2019-06-23 DIAGNOSIS — Z950 Presence of cardiac pacemaker: Secondary | ICD-10-CM

## 2019-06-23 DIAGNOSIS — Z8679 Personal history of other diseases of the circulatory system: Secondary | ICD-10-CM

## 2019-06-23 DIAGNOSIS — F0281 Dementia in other diseases classified elsewhere with behavioral disturbance: Secondary | ICD-10-CM | POA: Diagnosis not present

## 2019-06-23 DIAGNOSIS — Z1159 Encounter for screening for other viral diseases: Secondary | ICD-10-CM | POA: Insufficient documentation

## 2019-06-23 DIAGNOSIS — Z87891 Personal history of nicotine dependence: Secondary | ICD-10-CM

## 2019-06-23 DIAGNOSIS — E1142 Type 2 diabetes mellitus with diabetic polyneuropathy: Secondary | ICD-10-CM

## 2019-06-23 DIAGNOSIS — Z951 Presence of aortocoronary bypass graft: Secondary | ICD-10-CM | POA: Diagnosis not present

## 2019-06-23 DIAGNOSIS — Z5181 Encounter for therapeutic drug level monitoring: Secondary | ICD-10-CM

## 2019-06-23 DIAGNOSIS — I071 Rheumatic tricuspid insufficiency: Secondary | ICD-10-CM | POA: Diagnosis not present

## 2019-06-23 DIAGNOSIS — Z4781 Encounter for orthopedic aftercare following surgical amputation: Secondary | ICD-10-CM

## 2019-06-23 DIAGNOSIS — I110 Hypertensive heart disease with heart failure: Secondary | ICD-10-CM

## 2019-06-23 DIAGNOSIS — Z01812 Encounter for preprocedural laboratory examination: Secondary | ICD-10-CM | POA: Insufficient documentation

## 2019-06-23 DIAGNOSIS — I2510 Atherosclerotic heart disease of native coronary artery without angina pectoris: Secondary | ICD-10-CM

## 2019-06-23 DIAGNOSIS — I6330 Cerebral infarction due to thrombosis of unspecified cerebral artery: Secondary | ICD-10-CM

## 2019-06-23 DIAGNOSIS — J8410 Pulmonary fibrosis, unspecified: Secondary | ICD-10-CM | POA: Diagnosis not present

## 2019-06-23 DIAGNOSIS — I69398 Other sequelae of cerebral infarction: Secondary | ICD-10-CM | POA: Diagnosis not present

## 2019-06-23 DIAGNOSIS — D472 Monoclonal gammopathy: Secondary | ICD-10-CM

## 2019-06-23 DIAGNOSIS — I10 Essential (primary) hypertension: Secondary | ICD-10-CM | POA: Diagnosis not present

## 2019-06-23 DIAGNOSIS — Z89422 Acquired absence of other left toe(s): Secondary | ICD-10-CM

## 2019-06-23 LAB — SARS CORONAVIRUS 2 (TAT 6-24 HRS): SARS Coronavirus 2: NEGATIVE

## 2019-06-23 MED ORDER — TORSEMIDE 20 MG PO TABS: 20.0000 mg | ORAL_TABLET | Freq: Two times a day (BID) | ORAL | 3 refills | Status: DC

## 2019-06-23 NOTE — Patient Instructions (Signed)
Medication Instructions:  STOP FUROSEMIDE  START TORSEMIDE 20 MG ONE TABLET TWICE DAILY If you need a refill on your cardiac medications before your next appointment, please call your pharmacy.   Lab work: Your physician recommends that you return for lab work in: [place] If you have labs (blood work) drawn today and your tests are completely normal, you will receive your results only by: [name] MyChart Message (if you have MyChart) OR . A paper copy in the mail If you have any lab test that is abnormal or we need to change your treatment, we will call you to review the results.  Follow-Up: At [facility], you and your health needs are our priority.  As part of our continuing mission to provide you with exceptional heart care, we have created designated Provider Care Teams.  These Care Teams include your primary Cardiologist (physician) and Advanced Practice Providers (APPs -  Physician Assistants and Nurse Practitioners) who all work together to provide you with the care you need, when you need it. Your physician recommends that you schedule a follow-up appointment in: 8-12 [place]

## 2019-06-24 ENCOUNTER — Telehealth: Payer: Self-pay | Admitting: Internal Medicine

## 2019-06-24 DIAGNOSIS — F0281 Dementia in other diseases classified elsewhere with behavioral disturbance: Secondary | ICD-10-CM | POA: Diagnosis not present

## 2019-06-24 DIAGNOSIS — I110 Hypertensive heart disease with heart failure: Secondary | ICD-10-CM | POA: Diagnosis not present

## 2019-06-24 DIAGNOSIS — G3183 Dementia with Lewy bodies: Secondary | ICD-10-CM | POA: Diagnosis not present

## 2019-06-24 DIAGNOSIS — I2510 Atherosclerotic heart disease of native coronary artery without angina pectoris: Secondary | ICD-10-CM | POA: Diagnosis not present

## 2019-06-24 DIAGNOSIS — R2689 Other abnormalities of gait and mobility: Secondary | ICD-10-CM | POA: Diagnosis not present

## 2019-06-24 DIAGNOSIS — I69398 Other sequelae of cerebral infarction: Secondary | ICD-10-CM | POA: Diagnosis not present

## 2019-06-24 NOTE — Telephone Encounter (Signed)
New Message    Patient needs to know what time to be here tomorrow for procedure it says 12:30 but patient states his card says 10:30 so they wanna know which time.

## 2019-06-24 NOTE — Telephone Encounter (Signed)
Returned call to wife.  Pt to arrive to [facility] at 10:30 am tomorrow for generator change.  All questions answered.

## 2019-06-25 ENCOUNTER — Ambulatory Visit (HOSPITAL_COMMUNITY)
Admission: RE | Admit: 2019-06-25 | Discharge: 2019-06-25 | Disposition: A | Discharge status: Home / Self Care | Payer: Medicare Other | Attending: Internal Medicine | Admitting: Internal Medicine

## 2019-06-25 ENCOUNTER — Encounter (HOSPITAL_COMMUNITY)
Admission: RE | Disposition: A | Discharge status: Home / Self Care | Payer: Self-pay | Source: Home / Self Care | Attending: Internal Medicine

## 2019-06-25 ENCOUNTER — Other Ambulatory Visit: Payer: Self-pay

## 2019-06-25 DIAGNOSIS — E1140 Type 2 diabetes mellitus with diabetic neuropathy, unspecified: Secondary | ICD-10-CM | POA: Diagnosis not present

## 2019-06-25 DIAGNOSIS — I5032 Chronic diastolic (congestive) heart failure: Secondary | ICD-10-CM | POA: Diagnosis not present

## 2019-06-25 DIAGNOSIS — I110 Hypertensive heart disease with heart failure: Secondary | ICD-10-CM | POA: Insufficient documentation

## 2019-06-25 DIAGNOSIS — E7800 Pure hypercholesterolemia, unspecified: Secondary | ICD-10-CM | POA: Diagnosis not present

## 2019-06-25 DIAGNOSIS — E785 Hyperlipidemia, unspecified: Secondary | ICD-10-CM | POA: Diagnosis not present

## 2019-06-25 DIAGNOSIS — G4733 Obstructive sleep apnea (adult) (pediatric): Secondary | ICD-10-CM | POA: Insufficient documentation

## 2019-06-25 DIAGNOSIS — Z45010 Encounter for checking and testing of cardiac pacemaker pulse generator [battery]: Secondary | ICD-10-CM | POA: Diagnosis not present

## 2019-06-25 DIAGNOSIS — Z89422 Acquired absence of other left toe(s): Secondary | ICD-10-CM | POA: Insufficient documentation

## 2019-06-25 DIAGNOSIS — Z79899 Other long term (current) drug therapy: Secondary | ICD-10-CM | POA: Diagnosis not present

## 2019-06-25 DIAGNOSIS — Z888 Allergy status to other drugs, medicaments and biological substances status: Secondary | ICD-10-CM | POA: Diagnosis not present

## 2019-06-25 DIAGNOSIS — N400 Enlarged prostate without lower urinary tract symptoms: Secondary | ICD-10-CM | POA: Insufficient documentation

## 2019-06-25 DIAGNOSIS — Z951 Presence of aortocoronary bypass graft: Secondary | ICD-10-CM | POA: Diagnosis not present

## 2019-06-25 DIAGNOSIS — Z886 Allergy status to analgesic agent status: Secondary | ICD-10-CM | POA: Diagnosis not present

## 2019-06-25 DIAGNOSIS — Z9079 Acquired absence of other genital organ(s): Secondary | ICD-10-CM | POA: Insufficient documentation

## 2019-06-25 DIAGNOSIS — I495 Sick sinus syndrome: Secondary | ICD-10-CM | POA: Diagnosis not present

## 2019-06-25 DIAGNOSIS — Z7982 Long term (current) use of aspirin: Secondary | ICD-10-CM | POA: Diagnosis not present

## 2019-06-25 DIAGNOSIS — I2510 Atherosclerotic heart disease of native coronary artery without angina pectoris: Secondary | ICD-10-CM | POA: Diagnosis not present

## 2019-06-25 DIAGNOSIS — Z8249 Family history of ischemic heart disease and other diseases of the circulatory system: Secondary | ICD-10-CM | POA: Insufficient documentation

## 2019-06-25 DIAGNOSIS — I4819 Other persistent atrial fibrillation: Secondary | ICD-10-CM | POA: Diagnosis not present

## 2019-06-25 DIAGNOSIS — Z87891 Personal history of nicotine dependence: Secondary | ICD-10-CM | POA: Insufficient documentation

## 2019-06-25 HISTORY — PX: PPM GENERATOR CHANGEOUT: EP1233

## 2019-06-25 LAB — SURGICAL PCR SCREEN
MRSA, PCR: NEGATIVE
Staphylococcus aureus: NEGATIVE

## 2019-06-25 SURGERY — PPM GENERATOR CHANGEOUT

## 2019-06-25 MED ORDER — CEFAZOLIN SODIUM-DEXTROSE 2-4 GM/100ML-% IV SOLN
INTRAVENOUS | Status: AC
  Filled 2019-06-25: qty 100

## 2019-06-25 MED ORDER — SODIUM CHLORIDE 0.9 % IV SOLN
80.0000 mg | INTRAVENOUS | Status: AC
  Administered 2019-06-25: 80 mg

## 2019-06-25 MED ORDER — HEPARIN (PORCINE) IN NACL 1000-0.9 UT/500ML-% IV SOLN: INTRAVENOUS | Status: DC | PRN

## 2019-06-25 MED ORDER — LIDOCAINE HCL (PF) 1 % IJ SOLN
INTRAMUSCULAR | Status: DC | PRN
  Administered 2019-06-25: 50 mL

## 2019-06-25 MED ORDER — FENTANYL CITRATE (PF) 100 MCG/2ML IJ SOLN
INTRAMUSCULAR | Status: DC | PRN
  Administered 2019-06-25: 12.5 ug via INTRAVENOUS

## 2019-06-25 MED ORDER — MIDAZOLAM HCL 5 MG/5ML IJ SOLN
INTRAMUSCULAR | Status: DC | PRN
  Administered 2019-06-25: 1 mg via INTRAVENOUS

## 2019-06-25 MED ORDER — CEFAZOLIN SODIUM-DEXTROSE 2-4 GM/100ML-% IV SOLN
2.0000 g | INTRAVENOUS | Status: AC
  Administered 2019-06-25: 2 g via INTRAVENOUS

## 2019-06-25 MED ORDER — LIDOCAINE HCL (PF) 1 % IJ SOLN
INTRAMUSCULAR | Status: AC
  Filled 2019-06-25: qty 90

## 2019-06-25 MED ORDER — MUPIROCIN 2 % EX OINT
1.0000 "application " | TOPICAL_OINTMENT | Freq: Once | CUTANEOUS | Status: AC
  Administered 2019-06-25: 1 via TOPICAL
  Filled 2019-06-25: qty 22

## 2019-06-25 MED ORDER — SODIUM CHLORIDE 0.9 % IV SOLN
INTRAVENOUS | Status: AC
  Filled 2019-06-25: qty 2

## 2019-06-25 MED ORDER — ACETAMINOPHEN 325 MG PO TABS
325.0000 mg | ORAL_TABLET | ORAL | Status: DC | PRN
  Filled 2019-06-25: qty 2

## 2019-06-25 MED ORDER — MIDAZOLAM HCL 5 MG/5ML IJ SOLN
INTRAMUSCULAR | Status: AC
  Filled 2019-06-25: qty 5

## 2019-06-25 MED ORDER — CHLORHEXIDINE GLUCONATE 4 % EX LIQD
60.0000 mL | Freq: Once | CUTANEOUS | Status: DC
  Filled 2019-06-25: qty 60

## 2019-06-25 MED ORDER — FENTANYL CITRATE (PF) 100 MCG/2ML IJ SOLN
INTRAMUSCULAR | Status: AC
  Filled 2019-06-25: qty 2

## 2019-06-25 MED ORDER — SODIUM CHLORIDE 0.9 % IV SOLN
INTRAVENOUS | Status: DC
  Administered 2019-06-25: 250 mL via INTRAVENOUS
  Administered 2019-06-25: 11:00:00 via INTRAVENOUS

## 2019-06-25 MED ORDER — ONDANSETRON HCL 4 MG/2ML IJ SOLN: 4.0000 mg | Freq: Four times a day (QID) | INTRAMUSCULAR | Status: DC | PRN

## 2019-06-25 SURGICAL SUPPLY — 4 items
CABLE SURGICAL S-101-97-12 (CABLE) ×2 IMPLANT
PACEMAKER ASSURITY DR-RF (Pacemaker) ×1 IMPLANT
PAD PRO RADIOLUCENT 2001M-C (PAD) ×2 IMPLANT
TRAY PACEMAKER INSERTION (PACKS) ×2 IMPLANT

## 2019-06-25 NOTE — Discharge Instructions (Signed)
Post procedure care instructions Keep incision clean and dry for 10 days. No driving for 2 days.  You can remove outer dressing tomorrow. Leave steri-strips (little pieces of tape) on until seen in the office for wound check appointment. Call the office [phone]) for redness, drainage, swelling, or fever.

## 2019-06-25 NOTE — H&P (Signed)
Electrophysiology Office Note  Date: 06/11/2019  ID: Malaky Tetrault., DOB [birth year], MRN [id number]  PCP: [name], MD  Primary Cardiologist: [name]  Electrophysiologist: Lovena Le  CC: Pacemaker follow-up  [name]. is a 78 y.o. male seen today for Dr Lovena Le. He has not been seen in EP clinic since 2018. He was recently admitted and had device interrogated. He was found to be at [facility] and asked to follow up today to discuss gen change. He presents today for routine electrophysiology followup. Since last being seen in our clinic, the patient reports doing relatively well. His toe has healed nicely. He is maintaining his fluid status (was diuresed 30 pounds during recent admission). He denies chest pain, palpitations, dyspnea, PND, orthopnea, nausea, vomiting, dizziness, syncope, edema, weight gain, or early satiety.  Device History:  STJ dual chamber PPM implanted 2009 for sinus node dysfunction      Past Medical History:  Diagnosis Date  . (HFpEF) heart failure with preserved ejection fraction ([place])    a. 05/2013 Echo: EF 55%, mild LVH, diast dysfxn, Ao sclerosis, mildly dil LA, RV dysfxn (poorly visualized), PASP 30mmHg; b. 03/2017 Echo: EF 55-60%, no rwma, triv MR, mildly dil RV, mod TR, PASP 50mmHg.  . Atrial fibrillation [facility])    s/p Cox Maze 1/09; Multaq Rx d/c'd in 2014 due to pulmo fibrosis; coumadin d/c'd in 2014 due to spontaneous subdural hematoma  . BPH (benign prostatic hyperplasia)   . CAD (coronary artery disease), native coronary artery    a. s/p CABG 12/2007; b. Myoview 12/2011: EF 66%, no scar or ischemia; normal.  . DM (diabetes mellitus) ([place])   . Hyperlipidemia type II   . Hypertension   . 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 OSA (obstructive sleep apnea)   . Pacemaker    PPM - St. Jude  . Peripheral neuropathy 07/31/2018  . Pulmonary fibrosis ([place])    Multaq d/c'd 7/14  . Subdural hematoma ([place]) 07/2012   spontaneous;  coumadin d/c'd => no longer a candidate for anticoagulation        Past Surgical History:  Procedure Laterality Date  . AMPUTATION Left 05/17/2019   Procedure: AMPUTATION LEFT FOURTH TOE; Surgeon: Meredith Pel, MD; Location: [place]; Service: Orthopedics; Laterality: Left;  . APPENDECTOMY    . CHOLECYSTECTOMY    . CORONARY ARTERY BYPASS GRAFT     x3 (left internal mammary artery to distal left anterior descending coronary artery, saphenous vain graft to second circumflex marginal branch, saphenous vain graft to posterior descending coronary artery, endoscopic saphenous vain harvest from right thigh) and modified Cox - Maze IV procedure. [name],MD. Electronically signed CHO/MEDQ D: 01/09/2008/ JOB: [id number] cc: [name]  . [name]  07/30/2012   Procedure: CRANIOTOMY HEMATOMA EVACUATION SUBDURAL; Surgeon: [name], MD; Location: [place] NEURO ORS; Service: Neurosurgery; Laterality: Right; Right craniotomy for evacuation of subdural hematoma  . FOOT SURGERY    . HERNIA REPAIR    . ORCHIECTOMY     Left / testicular cancer  . PACEMAKER PLACEMENT     PPM - St. Jude         Current Outpatient Medications  Medication Sig Dispense Refill  . acetaminophen (TYLENOL) 325 MG tablet Take 1-2 tablets (325-650 mg total) by mouth every 4 (four) hours as needed for mild pain.    [name] allopurinol (ZYLOPRIM) 300 MG tablet Take 1 tablet by mouth daily.    [name] aspirin 81 MG  EC tablet Take 1 tablet (81 mg total) by mouth daily.  0  . docusate sodium (COLACE) 100 MG capsule Take 1 capsule (100 mg total) by mouth 2 (two) times daily. 60 capsule 0  . ferrous sulfate 325 (65 FE) MG tablet Take 1 tablet (325 mg total) by mouth daily with breakfast. 30 tablet 1  . furosemide (LASIX) 40 MG tablet Take 2 tablets (80 mg total) by mouth daily. 75 tablet 0  . gabapentin (NEURONTIN) 300 MG capsule Take 1 capsule (300 mg total) by mouth 3 (three) times daily. 90 capsule 1  . hydrocerin (EUCERIN) CREA  Apply 1 application topically 2 (two) times daily.  0  . metoprolol tartrate (LOPRESSOR) 50 MG tablet Take 1 tablet (50 mg total) by mouth 2 (two) times daily. 60 tablet 1  . MYRBETRIQ 50 MG TB24 tablet TAKE 1 TABLET BY MOUTH EVERY DAY (Patient taking differently: Take 50 mg by mouth daily. ) 30 tablet 3  . pantoprazole (PROTONIX) 40 MG tablet Take 40 mg by mouth daily.    . potassium chloride SA (K-DUR) 20 MEQ tablet Take 1 tablet (20 mEq total) by mouth daily. 30 tablet 1  . rosuvastatin (CRESTOR) 40 MG tablet Take 1 tablet (40 mg total) by mouth daily. 90 tablet 2  . sertraline (ZOLOFT) 100 MG tablet Take 200 mg by mouth daily.     . Tamsulosin HCl (FLOMAX) 0.4 MG CAPS Take 0.4 mg by mouth at bedtime.      No current facility-administered medications for this visit.   Allergies: Lipitor [atorvastatin], Nsaids, Warfarin and related, Allopurinol, and Enbrel [etanercept]  Social History:  Social History        Socioeconomic History  . Marital status: Married    Spouse name: [name]  . Number of children: 2  . Years of education: Not on file  . Highest education level: Not on file  Occupational History  . Occupation: Retired- IT trainer  Social Needs  . Financial resource strain: Not on file  . Food insecurity    Worry: Not on file    Inability: Not on file  . Transportation needs    Medical: Not on file    Non-medical: Not on file  Tobacco Use  . Smoking status: Former Smoker    Quit date: 02/21/1991    Years since quitting: 28.3  . Smokeless tobacco: Never Used  Substance and Sexual Activity  . Alcohol use: No    Alcohol/week: 0.0 standard drinks  . Drug use: No  . Sexual activity: Not Currently  Lifestyle  . Physical activity    Days per week: Not on file    Minutes per session: Not on file  . Stress: Not on file  Relationships  . Social Herbalist on phone: Not on file    Gets together: Not on file    Attends religious service: Not on file    Active  member of club or organization: Not on file    Attends meetings of clubs or organizations: Not on file    Relationship status: Not on file  . Intimate partner violence    Fear of current or ex partner: Not on file    Emotionally abused: Not on file    Physically abused: Not on file    Forced sexual activity: Not on file  Other Topics Concern  . Not on file  Social History Narrative   Lives with wife   Right handed    Married  with two children.    He is a Engineer, structural.   Family History:       Family History  Problem Relation Age of Onset  . Heart disease Father   . Heart attack Father   . Heart failure Father   . Heart disease Mother   . Alzheimer's disease Mother   . Dementia Mother   Review of Systems:  All other systems reviewed and are otherwise negative except as noted above.  Physical Exam:  VS: BP 106/60  Ht 5' 8.5" (1.74 m)  Wt 176 lb 3.2 oz (79.9 kg)  BMI 26.40 kg/m , BMI Body mass index is 26.4 kg/m.  GEN- The patient is elderly appearing, alert and oriented x 3 today.  HEENT: normocephalic, atraumatic; sclera clear, conjunctiva pink; hearing intact; oropharynx clear; neck supple  Lungs- Clear to ausculation bilaterally, normal work of breathing. No wheezes, rales, rhonchi  Heart- Regular rate and rhythm  GI- soft, non-tender, non-distended, bowel sounds present  Extremities- no clubbing, cyanosis, or edema  MS- no significant deformity or atrophy  Skin- warm and dry, no rash or lesion; PPM pocket well healed; left foot wrapped  Psych- euthymic mood, full affect  Neuro- strength and sensation are intact  PPM Interrogation- reviewed in detail today, See PACEART report  EKG: EKG is not ordered today.  Recent Labs:  05/16/2019: B Natriuretic Peptide 289.1; TSH 2.022  05/26/2019: Magnesium 2.0  05/27/2019: ALT 23  05/29/2019: Hemoglobin 11.2; Platelets 124  06/06/2019: BUN 41; Creatinine, Ser 1.86; Potassium 3.9; Sodium 142     Wt Readings from Last 3 Encounters:   06/11/19 176 lb 3.2 oz (79.9 kg)  06/04/19 179 lb 10.8 oz (81.5 kg)  05/26/19 185 lb (83.9 kg)  Other studies Reviewed:  Additional studies/ records that were reviewed today include: office notes, hospital records  Assessment and Plan:  1. Symptomatic bradycardia  PPM at ERI  Risks, benefits to gen change reviewed with patient who wishes to proceed. Will schedule at the next available time  2. Persistent atrial fibrillation  Not a candidate for [facility] per Dr Tanna Furry notes  3. Chronic diastolic heart failure  Weight maintaining since recent discharge  Continue Lasix and compression hose  Low sodium diet recommended  Current medicines are reviewed at length with the patient today.  The patient does not have concerns regarding his medicines. The following changes were made today: none  Labs/ tests ordered today include: pre procedure labs  No orders of the defined types were placed in this encounter.  Disposition: Follow up with Dr Lovena Le after gen change  Signed,  Chanetta Marshall, NP  06/11/2019  12:09 PM   [place]  [address]  [place]  [place] [address]  [phone] (office)  [phone] (fax)  EP Attending  Patient seen and examined. Agree with the findings as above. The patient has reached ERI with his St. Jude DDD PM implanted over 10 years ago for sinus node dysfunction. He will undergo PPM generator change out.   Mikle Bosworth.D.

## 2019-06-26 ENCOUNTER — Encounter (HOSPITAL_COMMUNITY): Payer: Self-pay | Admitting: Internal Medicine

## 2019-06-26 DIAGNOSIS — I2510 Atherosclerotic heart disease of native coronary artery without angina pectoris: Secondary | ICD-10-CM | POA: Diagnosis not present

## 2019-06-26 DIAGNOSIS — G3183 Dementia with Lewy bodies: Secondary | ICD-10-CM | POA: Diagnosis not present

## 2019-06-26 DIAGNOSIS — I110 Hypertensive heart disease with heart failure: Secondary | ICD-10-CM | POA: Diagnosis not present

## 2019-06-26 DIAGNOSIS — R2689 Other abnormalities of gait and mobility: Secondary | ICD-10-CM | POA: Diagnosis not present

## 2019-06-26 DIAGNOSIS — I69398 Other sequelae of cerebral infarction: Secondary | ICD-10-CM | POA: Diagnosis not present

## 2019-06-26 DIAGNOSIS — F0281 Dementia in other diseases classified elsewhere with behavioral disturbance: Secondary | ICD-10-CM | POA: Diagnosis not present

## 2019-06-27 ENCOUNTER — Other Ambulatory Visit: Payer: Self-pay

## 2019-06-27 ENCOUNTER — Emergency Department (HOSPITAL_COMMUNITY)
Admission: EM | Admit: 2019-06-27 | Discharge: 2019-06-27 | Disposition: A | Discharge status: Home / Self Care | Payer: Medicare Other | Attending: Emergency Medicine | Admitting: Emergency Medicine

## 2019-06-27 ENCOUNTER — Telehealth: Payer: Self-pay | Admitting: Internal Medicine

## 2019-06-27 ENCOUNTER — Encounter (HOSPITAL_COMMUNITY): Payer: Self-pay | Admitting: *Deleted

## 2019-06-27 DIAGNOSIS — Z9049 Acquired absence of other specified parts of digestive tract: Secondary | ICD-10-CM | POA: Diagnosis not present

## 2019-06-27 DIAGNOSIS — Z79899 Other long term (current) drug therapy: Secondary | ICD-10-CM | POA: Diagnosis not present

## 2019-06-27 DIAGNOSIS — T82837A Hemorrhage of cardiac prosthetic devices, implants and grafts, initial encounter: Secondary | ICD-10-CM

## 2019-06-27 DIAGNOSIS — L7622 Postprocedural hemorrhage and hematoma of skin and subcutaneous tissue following other procedure: Secondary | ICD-10-CM | POA: Insufficient documentation

## 2019-06-27 DIAGNOSIS — G3183 Dementia with Lewy bodies: Secondary | ICD-10-CM | POA: Insufficient documentation

## 2019-06-27 DIAGNOSIS — I2510 Atherosclerotic heart disease of native coronary artery without angina pectoris: Secondary | ICD-10-CM | POA: Insufficient documentation

## 2019-06-27 DIAGNOSIS — I5030 Unspecified diastolic (congestive) heart failure: Secondary | ICD-10-CM | POA: Diagnosis not present

## 2019-06-27 DIAGNOSIS — R58 Hemorrhage, not elsewhere classified: Secondary | ICD-10-CM

## 2019-06-27 DIAGNOSIS — Z951 Presence of aortocoronary bypass graft: Secondary | ICD-10-CM | POA: Diagnosis not present

## 2019-06-27 DIAGNOSIS — Z950 Presence of cardiac pacemaker: Secondary | ICD-10-CM | POA: Diagnosis not present

## 2019-06-27 DIAGNOSIS — E119 Type 2 diabetes mellitus without complications: Secondary | ICD-10-CM | POA: Diagnosis not present

## 2019-06-27 DIAGNOSIS — Z87891 Personal history of nicotine dependence: Secondary | ICD-10-CM | POA: Diagnosis not present

## 2019-06-27 DIAGNOSIS — Z4800 Encounter for change or removal of nonsurgical wound dressing: Secondary | ICD-10-CM | POA: Diagnosis not present

## 2019-06-27 DIAGNOSIS — I110 Hypertensive heart disease with heart failure: Secondary | ICD-10-CM | POA: Insufficient documentation

## 2019-06-27 DIAGNOSIS — Z5189 Encounter for other specified aftercare: Secondary | ICD-10-CM

## 2019-06-27 LAB — CBC WITH DIFFERENTIAL/PLATELET
Abs Immature Granulocytes: 0.01 10*3/uL (ref 0.00–0.07)
Basophils Absolute: 0 10*3/uL (ref 0.0–0.1)
Basophils Relative: 1 %
Eosinophils Absolute: 0.2 10*3/uL (ref 0.0–0.5)
Eosinophils Relative: 4 %
HCT: 32.3 % — ABNORMAL LOW (ref 39.0–52.0)
Hemoglobin: 10.4 g/dL — ABNORMAL LOW (ref 13.0–17.0)
Immature Granulocytes: 0 %
Lymphocytes Relative: 23 %
Lymphs Abs: 0.9 10*3/uL (ref 0.7–4.0)
MCH: 29.2 pg (ref 26.0–34.0)
MCHC: 32.2 g/dL (ref 30.0–36.0)
MCV: 90.7 fL (ref 80.0–100.0)
Monocytes Absolute: 0.2 10*3/uL (ref 0.1–1.0)
Monocytes Relative: 6 %
Neutro Abs: 2.7 10*3/uL (ref 1.7–7.7)
Neutrophils Relative %: 66 %
Platelets: 75 10*3/uL — ABNORMAL LOW (ref 150–400)
RBC: 3.56 MIL/uL — ABNORMAL LOW (ref 4.22–5.81)
RDW: 17.1 % — ABNORMAL HIGH (ref 11.5–15.5)
WBC: 4 10*3/uL (ref 4.0–10.5)
nRBC: 0 % (ref 0.0–0.2)

## 2019-06-27 LAB — BASIC METABOLIC PANEL
Anion gap: 8 (ref 5–15)
BUN: 26 mg/dL — ABNORMAL HIGH (ref 8–23)
CO2: 29 mmol/L (ref 22–32)
Calcium: 9.2 mg/dL (ref 8.9–10.3)
Chloride: 104 mmol/L (ref 98–111)
Creatinine, Ser: 1.59 mg/dL — ABNORMAL HIGH (ref 0.61–1.24)
GFR calc Af Amer: 48 mL/min — ABNORMAL LOW (ref >60)
GFR calc non Af Amer: 42 mL/min — ABNORMAL LOW (ref >60)
Glucose, Bld: 103 mg/dL — ABNORMAL HIGH (ref 70–99)
Potassium: 3.8 mmol/L (ref 3.5–5.1)
Sodium: 141 mmol/L (ref 135–145)

## 2019-06-27 LAB — PROTIME-INR
INR: 1.3 — ABNORMAL HIGH (ref 0.8–1.2)
Prothrombin Time: 15.7 seconds — ABNORMAL HIGH (ref 11.4–15.2)

## 2019-06-27 NOTE — ED Notes (Signed)
Patient verbalizes understanding of discharge instructions. Opportunity for questioning and answers were provided. Armband removed by staff, pt discharged from ED.  

## 2019-06-27 NOTE — Telephone Encounter (Signed)
Appointment changed to 0900 am on 06/30/19 in device clinic.

## 2019-06-27 NOTE — ED Notes (Signed)
Patient states he had a procedure on his pacemaker Wed.  States last pm it started bleeding and wouldn't stop. Upon arrival patient is alert , dressing removed from pacer site large clot noted. Small amt of oozing coming from site.

## 2019-06-27 NOTE — Progress Notes (Addendum)
The patient is s/p  PPM generator change with Dr.Taylor 06/25/2019  He came to the ER today with persistent oozing, bleeding apparently saturating a number of bandages/dressings at home, and shirts. Dr. Lovena Le looked at site, there is no hematoma, there is oozing from medial edge, steri strips in place, advised to leave steri strips in place, hold pressure and place pressure dressing.  I held pressure for 20 minutes  And did not appreciate any ongoing/active bleeding aferwards a  pressure dressing was applied The patient tolerated this well.  He is advised not to take his ASA until seen on Monday in our office.  Encouraged to call for any concerns, bleeding He is scheduled for 06/30/2019 without device clinic for removal of dressing and re-evaluation of site.  Tommye Standard, PA-C  Mikle Bosworth.D.

## 2019-06-27 NOTE — Telephone Encounter (Signed)
  Daughter is calling because patient has an wound check appt with device clinic on Monday 06/30/19 @ 11 am. He originally had appt on 07/01/19 but Tommye Standard moved him to Monday. The problem they have is that he has another appt with his PCP on 06/30/19 @ 10:30 that they cannot cancel. Can he be seen a little later on Monday or moved back to Tuesday for that appt? Please call wife and let her know what to do.

## 2019-06-27 NOTE — ED Provider Notes (Signed)
[facility] EMERGENCY DEPARTMENT Provider Note   CSN: [id number] Arrival date & time: 06/27/19  8921     History   Chief Complaint Chief Complaint  Patient presents with  . Pacemaker Problem    HPI [name]. is a 78 y.o. male.     The history is provided by the patient and medical records. No language interpreter was used.  Wound Check This is a new problem. The current episode started yesterday. The problem occurs constantly. The problem has not changed since onset.Pertinent negatives include no chest pain, no abdominal pain, no headaches and no shortness of breath. Nothing aggravates the symptoms. Nothing relieves the symptoms. He has tried nothing for the symptoms. The treatment provided no relief.    Past Medical History:  Diagnosis Date  . (HFpEF) heart failure with preserved ejection fraction ([place])    a. 05/2013 Echo: EF 55%, mild LVH, diast dysfxn, Ao sclerosis, mildly dil LA, RV dysfxn (poorly visualized), PASP 80mmHg;  b. 03/2017 Echo: EF 55-60%, no rwma, triv MR, mildly dil RV, mod TR, PASP 25mmHg.  . Atrial fibrillation [facility])    s/p Cox Maze 1/09;  Multaq Rx d/c'd in 2014 due to pulmo fibrosis;  coumadin d/c'd in 2014 due to spontaneous subdural hematoma  . BPH (benign prostatic hyperplasia)   . CAD (coronary artery disease), native coronary artery    a. s/p CABG 12/2007;  b. Myoview 12/2011: EF 66%, no scar or ischemia; normal.  . DM (diabetes mellitus) ([place])   . Hyperlipidemia type II   . Hypertension   . 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 OSA (obstructive sleep apnea)   . Pacemaker    PPM - St. Jude  . Peripheral neuropathy 07/31/2018  . 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 Subdural hematoma ([employer]) 07/2012   spontaneous;  coumadin d/c'd => no longer a candidate for anticoagulation    Patient Active Problem List   Diagnosis Date Noted  . Acute on chronic diastolic heart failure ([place])  06/03/2019  . Coronary artery disease involving native heart without angina pectoris 06/03/2019  . Debility 05/26/2019  . 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 Toe infection 05/17/2019  . DOE (dyspnea on exertion) 05/15/2019  . Severe tricuspid regurgitation 05/15/2019  . Acute on chronic right-sided heart failure ([place]) 05/15/2019  . Cellulitis of fourth toe of left foot   . Excessive daytime sleepiness 02/11/2019  . 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[place]) 02/11/2019  . Iron deficiency anemia 02/04/2019  . Poor memory 12/16/2018  . Deficiency anemia 11/19/2018  . Other fatigue 11/19/2018  . History of elevated PSA 11/19/2018  . Prostate cancer screening 11/19/2018  . Prostate CA ([place]) 11/19/2018  . Pancytopenia, acquired ([place]) 08/13/2018  . Recurrent falls 08/13/2018  . 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 Peripheral neuropathy 07/31/2018  . 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 Anemia, chronic disease 01/28/2018  . History of skin cancer 07/27/2017  . Fatty liver disease, nonalcoholic 19/41/7408  . Thrombocytopenia ([place]) 11/27/2016  . Lung nodule 04/23/2014  . Postinflammatory pulmonary fibrosis ([place]) 06/13/2013  . Long term (current) use of anticoagulants 11/22/2011  . Abdominal bruit 03/07/2011  . Pacemaker-St.Jude   . Hx of CABG   . Hyperlipidemia type II   . OSA (obstructive sleep apnea)   . PACEMAKER, PERMANENT 08/17/2010  . DIABETES MELLITUS 08/16/2010  . HYPERLIPIDEMIA 08/16/2010  . Obesity  08/16/2010  . Essential hypertension 08/16/2010  . Atrial fibrillation ([place]) 08/16/2010  . IRREGULAR HEART RATE 08/16/2010  . Sleep apnea 08/16/2010  . BENIGN PROSTATIC HYPERTROPHY, HX OF 08/16/2010    Past Surgical History:  Procedure Laterality Date  . AMPUTATION Left 05/17/2019   Procedure: AMPUTATION LEFT FOURTH TOE;  Surgeon: Meredith Pel, MD;  Location: [facility];  Service: Orthopedics;   Laterality: Left;  . APPENDECTOMY    . CHOLECYSTECTOMY    . CORONARY ARTERY BYPASS GRAFT     x3 (left internal mammary artery to distal left anterior descending coronary artery, saphenous vain graft to second circumflex marginal branch, saphenous vain graft to posterior descending coronary artery, endoscopic saphenous vain harvest from right thigh) and modified Cox - Maze IV procedure.  [name],MD. Electronically signed CHO/MEDQ D: 01/09/2008/ JOB: [id number] cc:  [name]  . [name]  07/30/2012   Procedure: CRANIOTOMY HEMATOMA EVACUATION SUBDURAL;  Surgeon: [name], MD;  Location: [facility];  Service: Neurosurgery;  Laterality: Right;  Right craniotomy for evacuation of subdural hematoma  . FOOT SURGERY    . HERNIA REPAIR    . ORCHIECTOMY     Left  /  testicular cancer  . PACEMAKER PLACEMENT     PPM - St. Jude  . PPM GENERATOR CHANGEOUT N/A 06/25/2019   Procedure: PPM GENERATOR CHANGEOUT;  Surgeon: Evans Lance, MD;  Location: [facility];  Service: Cardiovascular;  Laterality: N/A;        Home Medications    Prior to Admission medications   Medication Sig Start Date End Date Taking? Authorizing Provider  acetaminophen (TYLENOL) 325 MG tablet Take 1-2 tablets (325-650 mg total) by mouth every 4 (four) hours as needed for mild pain. 05/28/19   Love, Ivan Anchors, PA-C  allopurinol (ZYLOPRIM) 300 MG tablet Take 300 mg by mouth daily.  06/21/16   [provider]  aspirin 81 MG EC tablet Take 1 tablet (81 mg total) by mouth daily. 05/28/19   Love, Ivan Anchors, PA-C  donepezil (ARICEPT) 5 MG tablet Take 1 tablet (5 mg total) by mouth at bedtime. 06/19/19   Kathrynn Ducking, MD  ferrous sulfate 325 (65 FE) MG tablet Take 1 tablet (325 mg total) by mouth daily with breakfast. 06/04/19   Love, Ivan Anchors, PA-C  gabapentin (NEURONTIN) 300 MG capsule Take 1 capsule (300 mg total) by mouth 3 (three) times daily. 06/04/19   Love, Ivan Anchors, PA-C  hydrocerin (EUCERIN) CREA  Apply 1 application topically 2 (two) times daily. 06/04/19   Love, Ivan Anchors, PA-C  metoprolol tartrate (LOPRESSOR) 50 MG tablet Take 1 tablet (50 mg total) by mouth 2 (two) times daily. 06/04/19   Love, Ivan Anchors, PA-C  MYRBETRIQ 50 MG TB24 tablet TAKE 1 TABLET BY MOUTH EVERY DAY Patient taking differently: Take 50 mg by mouth daily.  04/21/19   Kathrynn Ducking, MD  neomycin-bacitracin-polymyxin (NEOSPORIN) ointment Apply 1 application topically daily.    [provider]  pantoprazole (PROTONIX) 40 MG tablet Take 40 mg by mouth daily. 02/22/17   [provider]  potassium chloride SA (K-DUR) 20 MEQ tablet Take 1 tablet (20 mEq total) by mouth daily. 06/04/19   Love, Ivan Anchors, PA-C  rosuvastatin (CRESTOR) 40 MG tablet Take 1 tablet (40 mg total) by mouth daily. Patient taking differently: Take 40 mg by mouth every evening.  12/25/18   [name], MD  sertraline (ZOLOFT) 100 MG tablet Take 200  mg by mouth daily.     [provider]  Tamsulosin HCl (FLOMAX) 0.4 MG CAPS Take 0.4 mg by mouth at bedtime.     [provider]  torsemide (DEMADEX) 20 MG tablet Take 1 tablet (20 mg total) by mouth 2 (two) times daily. 06/23/19 09/21/19  [name], MD    Family History Family History  Problem Relation Age of Onset  . Heart disease Father   . Heart attack Father   . Heart failure Father   . Heart disease Mother   . Alzheimer's disease Mother   . Dementia Mother     Social History Social History   Tobacco Use  . Smoking status: Former Smoker    Quit date: 02/21/1991    Years since quitting: 28.3  . Smokeless tobacco: Never Used  Substance Use Topics  . Alcohol use: No    Alcohol/week: 0.0 standard drinks  . Drug use: No     Allergies   Lipitor [atorvastatin], Nsaids, Warfarin and related, and Enbrel [etanercept]   Review of Systems Review of Systems  Constitutional: Negative for chills, diaphoresis, fatigue and fever.  HENT: Negative for  congestion.   Respiratory: Negative for chest tightness and shortness of breath.   Cardiovascular: Negative for chest pain.  Gastrointestinal: Negative for abdominal pain, constipation, diarrhea, nausea and vomiting.  Genitourinary: Negative for enuresis and flank pain.  Musculoskeletal: Negative for back pain.  Skin: Positive for wound.  Neurological: Negative for syncope and headaches.     Physical Exam Updated Vital Signs BP 123/78   Pulse 89   Temp 98.3 F (36.8 C) (Oral)   Resp 13   Ht 5\' 8"  (1.727 m)   Wt 78.9 kg   SpO2 100%   BMI 26.46 kg/m   Physical Exam Vitals signs and nursing note reviewed.  Constitutional:      General: He is not in acute distress.    Appearance: He is well-developed. He is not ill-appearing, toxic-appearing or diaphoretic.  HENT:     Head: Normocephalic and atraumatic.     Nose: Nose normal.  Eyes:     Conjunctiva/sclera: Conjunctivae normal.     Pupils: Pupils are equal, round, and reactive to light.  Neck:     Musculoskeletal: Neck supple.  Cardiovascular:     Rate and Rhythm: Normal rate and regular rhythm.     Heart sounds: No murmur.  Pulmonary:     Effort: Pulmonary effort is normal. No respiratory distress.     Breath sounds: Normal breath sounds. No wheezing, rhonchi or rales.  Chest:     Chest wall: No tenderness.    Abdominal:     General: Abdomen is flat.     Palpations: Abdomen is soft.     Tenderness: There is no abdominal tenderness. There is no right CVA tenderness or left CVA tenderness.  Musculoskeletal:        General: No tenderness.     Comments: Symmetric pulses in upper and lower extremities.  Skin:    General: Skin is warm and dry.     Capillary Refill: Capillary refill takes less than 2 seconds.     Findings: No erythema.  Neurological:     Mental Status: He is alert and oriented to person, place, and time.  Psychiatric:        Mood and Affect: Mood normal.      ED Treatments / Results  Labs (all  labs ordered are listed, but only abnormal results are displayed)  Labs Reviewed  CBC WITH DIFFERENTIAL/PLATELET - Abnormal; Notable for the following components:      Result Value   RBC 3.56 (*)    Hemoglobin 10.4 (*)    HCT 32.3 (*)    RDW 17.1 (*)    Platelets 75 (*)    All other components within normal limits  PROTIME-INR - Abnormal; Notable for the following components:   Prothrombin Time 15.7 (*)    INR 1.3 (*)    All other components within normal limits  BASIC METABOLIC PANEL - Abnormal; Notable for the following components:   Glucose, Bld 103 (*)    BUN 26 (*)    Creatinine, Ser 1.59 (*)    GFR calc non Af Amer 42 (*)    GFR calc Af Amer 48 (*)    All other components within normal limits    EKG None  Radiology No results found.  Procedures Procedures (including critical care time)  Medications Ordered in ED Medications - No data to display   Initial Impression / Assessment and Plan / ED Course  I have reviewed the triage vital signs and the nursing notes.  Pertinent labs & imaging results that were available during my care of the patient were reviewed by me and considered in my medical decision making (see chart for details).        [name]. is a 78 y.o. male with a past medical history significant for CAD status post CABG, atrial fibrillation status post pacemaker placement with exchange of the generator yesterday, hypertension, hyperlipidemia, diabetes, stroke, aspirin use, and [name] who presents with bleeding.  Patient reports that his pacemaker surgical site started bleeding last night and has continued to bleed today.  Patient reports that it is soaked through 2 of his shirts and all the dressings he is try to use.  He called cardiology who told him to come to the emergency department for wound management.  He otherwise denies any chest pain, palpitations, shortness breath, lightheadedness, or syncope.  He denies any other complaints including no  other preceding symptoms.  On exam, lungs are clear and chest is nontender.  Abdomen is nontender.  Patient has pulses in all extremities.  Patient has a surgical site over his pacemaker that has Steri-Strips in place however there is oozing and bleeding and blood clot present.  Blood clot was nasally removed and wound continued to bleed.  Patient otherwise has reassuring exam.  Spoke with cardiology who will come address the patient's wound including holding pressure and place a pressure dressing.  They will schedule the patient follow-up.  Due to patient's history of [name] and other abnormalities, will get screening laboratory testing to make sure patient does not have a profound thrombocytopenia or other coagulation abnormality that might be contributing to his symptoms as he is on aspirin.  Anticipate discharge after reassuring labs and cardiology management.  Cardiology came to the bedside and was able to apply pressure dressing and manage the wound.  They request he stopped taking his aspirin till he is seen by cardiology on Monday and keep the pressure dressing in place.  We did screening blood work showing slightly decreased platelets from prior at 75 but otherwise it had not dropped significantly.  INR unchanged and kidney function is improving prior.  Other labs reassuring.  Patient agrees with discharge and will follow-up with cardiology.  He understood return precautions and follow-up instructions.  Patient discharged in good condition with resolution of his bleeding.  Final Clinical Impressions(s) / ED Diagnoses   Final diagnoses:  Bleeding  Visit for wound check    ED Discharge Orders    None      Clinical Impression: 1. Bleeding   2. Visit for wound check     Disposition: Discharge  Condition: Good  I have discussed the results, Dx and Tx plan with the pt(& family if present). He/she/they expressed understanding and agree(s) with the plan. Discharge instructions  discussed at great length. Strict return precautions discussed and pt &/or family have verbalized understanding of the instructions. No further questions at time of discharge.    New Prescriptions   No medications on file    Follow Up: [facility] [address], [place] [phone] Follow up 06/30/2019 @ 11:00AM, wound check visit  [place] [address] [place]       Aleane Wesenberg, Gwenyth Allegra, MD 06/27/19 1316

## 2019-06-27 NOTE — Telephone Encounter (Signed)
New Message °

## 2019-06-27 NOTE — Telephone Encounter (Signed)
Pts wife called to report that the pts had a gen change out 2 days ago with Dr. Lovena Le and he is has been bleeding"profusely" from the site.. he has soaked through to 2 T shirts and still bleeding after holding pressure.. he is only taking ASA 81mg .. she denies that he has been doing any heavy lifting or anything strenuous but she says she does not know for sure.. he may not have told her.   After talking with Chanetta Marshall NP.[name] pts wife advised that since he is bleeding that much he needs to go to the [place] [facility] to have it cleaned and looked at. She agrees and will take him this morning.

## 2019-06-27 NOTE — Discharge Instructions (Addendum)
Leave bulky pressure dressing on, do not remove, do not get wet.  Your blood work shows slight decrease in your platelets but overall was not significantly worsened or enough to further management and work-up at this time.  Please stop taking the aspirin as directed by the cardiology team until you see them on Monday.  Please follow-up.  If anything changes or worsens, please follow-up nearest emergency department.

## 2019-06-29 ENCOUNTER — Other Ambulatory Visit: Payer: Self-pay | Admitting: Physical Medicine and Rehabilitation

## 2019-06-30 ENCOUNTER — Ambulatory Visit (INDEPENDENT_AMBULATORY_CARE_PROVIDER_SITE_OTHER): Payer: Medicare Other | Admitting: *Deleted

## 2019-06-30 ENCOUNTER — Telehealth: Payer: Self-pay | Admitting: Internal Medicine

## 2019-06-30 ENCOUNTER — Other Ambulatory Visit: Payer: Self-pay

## 2019-06-30 DIAGNOSIS — E119 Type 2 diabetes mellitus without complications: Secondary | ICD-10-CM | POA: Diagnosis not present

## 2019-06-30 DIAGNOSIS — I5033 Acute on chronic diastolic (congestive) heart failure: Secondary | ICD-10-CM | POA: Diagnosis not present

## 2019-06-30 DIAGNOSIS — I5032 Chronic diastolic (congestive) heart failure: Secondary | ICD-10-CM | POA: Diagnosis not present

## 2019-06-30 DIAGNOSIS — I10 Essential (primary) hypertension: Secondary | ICD-10-CM | POA: Diagnosis not present

## 2019-06-30 DIAGNOSIS — G3183 Dementia with Lewy bodies: Secondary | ICD-10-CM | POA: Diagnosis not present

## 2019-06-30 DIAGNOSIS — I2510 Atherosclerotic heart disease of native coronary artery without angina pectoris: Secondary | ICD-10-CM | POA: Diagnosis not present

## 2019-06-30 DIAGNOSIS — I69398 Other sequelae of cerebral infarction: Secondary | ICD-10-CM | POA: Diagnosis not present

## 2019-06-30 DIAGNOSIS — E1121 Type 2 diabetes mellitus with diabetic nephropathy: Secondary | ICD-10-CM | POA: Diagnosis not present

## 2019-06-30 DIAGNOSIS — F0281 Dementia in other diseases classified elsewhere with behavioral disturbance: Secondary | ICD-10-CM | POA: Diagnosis not present

## 2019-06-30 DIAGNOSIS — I110 Hypertensive heart disease with heart failure: Secondary | ICD-10-CM | POA: Diagnosis not present

## 2019-06-30 DIAGNOSIS — R2689 Other abnormalities of gait and mobility: Secondary | ICD-10-CM | POA: Diagnosis not present

## 2019-06-30 DIAGNOSIS — Z8673 Personal history of transient ischemic attack (TIA), and cerebral infarction without residual deficits: Secondary | ICD-10-CM | POA: Diagnosis not present

## 2019-06-30 DIAGNOSIS — N189 Chronic kidney disease, unspecified: Secondary | ICD-10-CM | POA: Diagnosis not present

## 2019-06-30 LAB — CUP PACEART INCLINIC DEVICE CHECK
Battery Remaining Longevity: 127 mo
Battery Voltage: 3.16 V
Brady Statistic RA Percent Paced: 44 %
Brady Statistic RV Percent Paced: 24 %
Date Time Interrogation Session: 20200713102851
Implantable Lead Implant Date: 20090420
Implantable Lead Implant Date: 20090420
Implantable Lead Location: 753859
Implantable Lead Location: 753860
Implantable Pulse Generator Implant Date: 20200708
Lead Channel Impedance Value: 450 Ohm
Lead Channel Impedance Value: 537.5 Ohm
Lead Channel Pacing Threshold Amplitude: 0.625 V
Lead Channel Pacing Threshold Amplitude: 1 V
Lead Channel Pacing Threshold Pulse Width: 0.4 ms
Lead Channel Pacing Threshold Pulse Width: 0.4 ms
Lead Channel Sensing Intrinsic Amplitude: 0.7 mV
Lead Channel Sensing Intrinsic Amplitude: 6.9 mV
Lead Channel Setting Pacing Amplitude: 0.875
Lead Channel Setting Pacing Amplitude: 2 V
Lead Channel Setting Pacing Pulse Width: 0.4 ms
Lead Channel Setting Sensing Sensitivity: 2 mV
Pulse Gen Model: 2272
Pulse Gen Serial Number: 9149180

## 2019-06-30 NOTE — Telephone Encounter (Signed)
Has 3 mo supply with RF should not need more

## 2019-06-30 NOTE — Patient Instructions (Signed)
F?u for wound check 07/10/19 and call clinic if develop redness, drainage or increased swelling at wound site. If bleeding resumes apply pressure directly over wound and call clinic if bleeding continues after holding pressure for 10 minutes.

## 2019-06-30 NOTE — Progress Notes (Signed)
Wound check appointment after ED visit 06/27/19. [name] Wound without redness or edema. Pressure dressing removed. dried blood on 4x4s steri-strips in place with dried blood present. small amount of diffuse edema and bruising, area soft with no hematoma present. Ster-strips left in place to allow wound to continue to heal.  Normal device function. Thresholds, sensing, and impedances consistent with implant measurements. Gen change with mature leads. Device programmed at appropriate safety margins. Histogram distribution appropriate for patient and level of activity. No mode switches or high ventricular rates noted. Patient educated about wound care, arm mobility. Follow up wound check in 1 week to remove steri-strips. Education completed on wound care and infection.

## 2019-06-30 NOTE — Telephone Encounter (Signed)
Has 2 month supply why do they need more, refills go to PCP

## 2019-06-30 NOTE — Telephone Encounter (Signed)
Pt notified appointment for wound check moved to 07/07/19 at 0900. Virtual wound check and informed not to get wound wet until after 07/07/19 wound check.

## 2019-07-01 ENCOUNTER — Ambulatory Visit: Payer: Medicare Other

## 2019-07-02 DIAGNOSIS — I110 Hypertensive heart disease with heart failure: Secondary | ICD-10-CM | POA: Diagnosis not present

## 2019-07-02 DIAGNOSIS — I69398 Other sequelae of cerebral infarction: Secondary | ICD-10-CM | POA: Diagnosis not present

## 2019-07-02 DIAGNOSIS — I2510 Atherosclerotic heart disease of native coronary artery without angina pectoris: Secondary | ICD-10-CM | POA: Diagnosis not present

## 2019-07-02 DIAGNOSIS — R2689 Other abnormalities of gait and mobility: Secondary | ICD-10-CM | POA: Diagnosis not present

## 2019-07-02 DIAGNOSIS — G3183 Dementia with Lewy bodies: Secondary | ICD-10-CM | POA: Diagnosis not present

## 2019-07-02 DIAGNOSIS — F0281 Dementia in other diseases classified elsewhere with behavioral disturbance: Secondary | ICD-10-CM | POA: Diagnosis not present

## 2019-07-03 DIAGNOSIS — I5032 Chronic diastolic (congestive) heart failure: Secondary | ICD-10-CM | POA: Diagnosis not present

## 2019-07-04 ENCOUNTER — Encounter: Payer: Self-pay | Admitting: *Deleted

## 2019-07-04 LAB — BASIC METABOLIC PANEL
BUN/Creatinine Ratio: 23 (ref 10–24)
BUN: 44 mg/dL — ABNORMAL HIGH (ref 8–27)
CO2: 27 mmol/L (ref 20–29)
Calcium: 9.3 mg/dL (ref 8.6–10.2)
Chloride: 104 mmol/L (ref 96–106)
Creatinine, Ser: 1.93 mg/dL — ABNORMAL HIGH (ref 0.76–1.27)
GFR calc Af Amer: 38 mL/min/{1.73_m2} — ABNORMAL LOW (ref >59)
GFR calc non Af Amer: 33 mL/min/{1.73_m2} — ABNORMAL LOW (ref >59)
Glucose: 102 mg/dL — ABNORMAL HIGH (ref 65–99)
Potassium: 4.6 mmol/L (ref 3.5–5.2)
Sodium: 145 mmol/L — ABNORMAL HIGH (ref 134–144)

## 2019-07-05 ENCOUNTER — Emergency Department (HOSPITAL_COMMUNITY): Payer: Medicare Other

## 2019-07-05 ENCOUNTER — Emergency Department (HOSPITAL_COMMUNITY)
Admission: EM | Admit: 2019-07-05 | Discharge: 2019-07-06 | Disposition: A | Discharge status: Home / Self Care | Payer: Medicare Other | Attending: Emergency Medicine | Admitting: Emergency Medicine

## 2019-07-05 ENCOUNTER — Other Ambulatory Visit: Payer: Self-pay

## 2019-07-05 ENCOUNTER — Encounter (HOSPITAL_COMMUNITY): Payer: Self-pay | Admitting: *Deleted

## 2019-07-05 DIAGNOSIS — I10 Essential (primary) hypertension: Secondary | ICD-10-CM | POA: Diagnosis not present

## 2019-07-05 DIAGNOSIS — Y999 Unspecified external cause status: Secondary | ICD-10-CM | POA: Insufficient documentation

## 2019-07-05 DIAGNOSIS — S61210A Laceration without foreign body of right index finger without damage to nail, initial encounter: Secondary | ICD-10-CM

## 2019-07-05 DIAGNOSIS — H05231 Hemorrhage of right orbit: Secondary | ICD-10-CM | POA: Diagnosis not present

## 2019-07-05 DIAGNOSIS — Z23 Encounter for immunization: Secondary | ICD-10-CM | POA: Diagnosis not present

## 2019-07-05 DIAGNOSIS — W010XXA Fall on same level from slipping, tripping and stumbling without subsequent striking against object, initial encounter: Secondary | ICD-10-CM | POA: Diagnosis not present

## 2019-07-05 DIAGNOSIS — S0511XA Contusion of eyeball and orbital tissues, right eye, initial encounter: Secondary | ICD-10-CM | POA: Diagnosis not present

## 2019-07-05 DIAGNOSIS — S0512XA Contusion of eyeball and orbital tissues, left eye, initial encounter: Secondary | ICD-10-CM | POA: Diagnosis not present

## 2019-07-05 DIAGNOSIS — Y92015 Private garage of single-family (private) house as the place of occurrence of the external cause: Secondary | ICD-10-CM | POA: Insufficient documentation

## 2019-07-05 DIAGNOSIS — T148XXA Other injury of unspecified body region, initial encounter: Secondary | ICD-10-CM | POA: Diagnosis not present

## 2019-07-05 DIAGNOSIS — Y9389 Activity, other specified: Secondary | ICD-10-CM | POA: Insufficient documentation

## 2019-07-05 DIAGNOSIS — S0990XA Unspecified injury of head, initial encounter: Secondary | ICD-10-CM | POA: Diagnosis not present

## 2019-07-05 DIAGNOSIS — S51011A Laceration without foreign body of right elbow, initial encounter: Secondary | ICD-10-CM | POA: Diagnosis not present

## 2019-07-05 DIAGNOSIS — I2510 Atherosclerotic heart disease of native coronary artery without angina pectoris: Secondary | ICD-10-CM | POA: Insufficient documentation

## 2019-07-05 DIAGNOSIS — Z79899 Other long term (current) drug therapy: Secondary | ICD-10-CM | POA: Insufficient documentation

## 2019-07-05 DIAGNOSIS — S0181XA Laceration without foreign body of other part of head, initial encounter: Secondary | ICD-10-CM | POA: Diagnosis not present

## 2019-07-05 NOTE — ED Triage Notes (Addendum)
Pt reports loosing his balance and falling, hit head on concrete. Has laceration and hematoma to right forehead. Denies loc. Denies being on blood thinners. Also has skin tear to right elbow and abrasion to right knee.

## 2019-07-06 ENCOUNTER — Emergency Department (HOSPITAL_COMMUNITY): Payer: Medicare Other

## 2019-07-06 ENCOUNTER — Other Ambulatory Visit: Payer: Self-pay

## 2019-07-06 DIAGNOSIS — Z951 Presence of aortocoronary bypass graft: Secondary | ICD-10-CM | POA: Diagnosis not present

## 2019-07-06 DIAGNOSIS — Z5181 Encounter for therapeutic drug level monitoring: Secondary | ICD-10-CM | POA: Diagnosis not present

## 2019-07-06 DIAGNOSIS — H05231 Hemorrhage of right orbit: Secondary | ICD-10-CM | POA: Diagnosis not present

## 2019-07-06 DIAGNOSIS — D472 Monoclonal gammopathy: Secondary | ICD-10-CM | POA: Diagnosis not present

## 2019-07-06 DIAGNOSIS — Z8679 Personal history of other diseases of the circulatory system: Secondary | ICD-10-CM | POA: Diagnosis not present

## 2019-07-06 DIAGNOSIS — F0281 Dementia in other diseases classified elsewhere with behavioral disturbance: Secondary | ICD-10-CM | POA: Diagnosis not present

## 2019-07-06 DIAGNOSIS — I110 Hypertensive heart disease with heart failure: Secondary | ICD-10-CM | POA: Diagnosis not present

## 2019-07-06 DIAGNOSIS — I5032 Chronic diastolic (congestive) heart failure: Secondary | ICD-10-CM | POA: Diagnosis not present

## 2019-07-06 DIAGNOSIS — I2510 Atherosclerotic heart disease of native coronary artery without angina pectoris: Secondary | ICD-10-CM | POA: Diagnosis not present

## 2019-07-06 DIAGNOSIS — S0512XA Contusion of eyeball and orbital tissues, left eye, initial encounter: Secondary | ICD-10-CM | POA: Diagnosis not present

## 2019-07-06 DIAGNOSIS — Z7982 Long term (current) use of aspirin: Secondary | ICD-10-CM | POA: Diagnosis not present

## 2019-07-06 DIAGNOSIS — G3183 Dementia with Lewy bodies: Secondary | ICD-10-CM | POA: Diagnosis not present

## 2019-07-06 DIAGNOSIS — I69398 Other sequelae of cerebral infarction: Secondary | ICD-10-CM | POA: Diagnosis not present

## 2019-07-06 DIAGNOSIS — Z950 Presence of cardiac pacemaker: Secondary | ICD-10-CM | POA: Diagnosis not present

## 2019-07-06 DIAGNOSIS — Z4781 Encounter for orthopedic aftercare following surgical amputation: Secondary | ICD-10-CM | POA: Diagnosis not present

## 2019-07-06 DIAGNOSIS — Z87891 Personal history of nicotine dependence: Secondary | ICD-10-CM | POA: Diagnosis not present

## 2019-07-06 DIAGNOSIS — E1142 Type 2 diabetes mellitus with diabetic polyneuropathy: Secondary | ICD-10-CM | POA: Diagnosis not present

## 2019-07-06 DIAGNOSIS — Z89422 Acquired absence of other left toe(s): Secondary | ICD-10-CM | POA: Diagnosis not present

## 2019-07-06 DIAGNOSIS — R2689 Other abnormalities of gait and mobility: Secondary | ICD-10-CM | POA: Diagnosis not present

## 2019-07-06 MED ORDER — BACITRACIN ZINC 500 UNIT/GM EX OINT
TOPICAL_OINTMENT | Freq: Once | CUTANEOUS | Status: AC
  Administered 2019-07-06: 1 via TOPICAL
  Filled 2019-07-06: qty 0.9

## 2019-07-06 MED ORDER — LIDOCAINE HCL (PF) 1 % IJ SOLN
5.0000 mL | Freq: Once | INTRAMUSCULAR | Status: AC
  Administered 2019-07-06: 5 mL via INTRADERMAL
  Filled 2019-07-06: qty 5

## 2019-07-06 MED ORDER — TETANUS-DIPHTH-ACELL PERTUSSIS 5-2.5-18.5 LF-MCG/0.5 IM SUSP
0.5000 mL | Freq: Once | INTRAMUSCULAR | Status: AC
  Administered 2019-07-06: 02:00:00 0.5 mL via INTRAMUSCULAR
  Filled 2019-07-06: qty 0.5

## 2019-07-06 NOTE — ED Notes (Signed)
Suture car at the bedside. Wound care given.

## 2019-07-06 NOTE — ED Provider Notes (Signed)
TIME SEEN: 12:59 AM  CHIEF COMPLAINT: Fall, head injury  HPI: Patient is a 78 year old male with history of hypertension, diabetes, hyperlipidemia, CAD status post CABG, atrial fibrillation previously on Coumadin but discontinued secondary to a spontaneous subdural hematoma who presents to the emergency department after he fell just prior to arrival.  States he was sweeping his garage and bent down to sweep underneath his car when he fell hitting his head on the ground.  His wife reports that he normally uses a walker to ambulate but was not using his walker when he was in the garage.  He denies any chest pain, shortness of breath, dizziness or other preceding symptom that led to his fall.  He has a skin tear to the right forehead, right elbow and abrasion to the right knee.  He has been able to ambulate.  Has a laceration to the distal palmar aspect of the right second digit.  Has some bruising and swelling around the right eye but states he feels his vision is normal.  He does not think he lost consciousness.  He is on aspirin 81 mg but no other antiplatelet or anticoagulant.  No numbness or focal weakness.  ROS: See HPI Constitutional: no fever  Eyes: no drainage  ENT: no runny nose   Cardiovascular:  no chest pain  Resp: no SOB  GI: no vomiting GU: no dysuria Integumentary: no rash  Allergy: no hives  Musculoskeletal: no leg swelling  Neurological: no slurred speech ROS otherwise negative  PAST MEDICAL HISTORY/PAST SURGICAL HISTORY:  Past Medical History:  Diagnosis Date  . (HFpEF) heart failure with preserved ejection fraction ([place])    a. 05/2013 Echo: EF 55%, mild LVH, diast dysfxn, Ao sclerosis, mildly dil LA, RV dysfxn (poorly visualized), PASP 70mmHg;  b. 03/2017 Echo: EF 55-60%, no rwma, triv MR, mildly dil RV, mod TR, PASP 22mmHg.  . Atrial fibrillation [facility])    s/p Cox Maze 1/09;  Multaq Rx d/c'd in 2014 due to pulmo fibrosis;  coumadin d/c'd in 2014 due to spontaneous subdural  hematoma  . BPH (benign prostatic hyperplasia)   . CAD (coronary artery disease), native coronary artery    a. s/p CABG 12/2007;  b. Myoview 12/2011: EF 66%, no scar or ischemia; normal.  . DM (diabetes mellitus) ([place])   . Hyperlipidemia type II   . Hypertension   . 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 OSA (obstructive sleep apnea)   . Pacemaker    PPM - St. Jude  . Peripheral neuropathy 07/31/2018  . 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 Subdural hematoma ([place]) 07/2012   spontaneous;  coumadin d/c'd => no longer a candidate for anticoagulation    MEDICATIONS:  Prior to Admission medications   Medication Sig Start Date End Date Taking? Authorizing Provider  acetaminophen (TYLENOL) 325 MG tablet Take 1-2 tablets (325-650 mg total) by mouth every 4 (four) hours as needed for mild pain. 05/28/19   Love, Ivan Anchors, PA-C  allopurinol (ZYLOPRIM) 300 MG tablet Take 300 mg by mouth daily.  06/21/16   [provider]  aspirin 81 MG EC tablet Take 1 tablet (81 mg total) by mouth daily. 05/28/19   Love, Ivan Anchors, PA-C  donepezil (ARICEPT) 5 MG tablet Take 1 tablet (5 mg total) by mouth at bedtime. 06/19/19   Kathrynn Ducking, MD  ferrous sulfate 325 (65 FE) MG tablet Take 1 tablet (325 mg total) by mouth daily  with breakfast. 06/04/19   Love, Ivan Anchors, PA-C  gabapentin (NEURONTIN) 300 MG capsule Take 1 capsule (300 mg total) by mouth 3 (three) times daily. 06/04/19   Love, Ivan Anchors, PA-C  hydrocerin (EUCERIN) CREA Apply 1 application topically 2 (two) times daily. 06/04/19   Love, Ivan Anchors, PA-C  metoprolol tartrate (LOPRESSOR) 50 MG tablet Take 1 tablet (50 mg total) by mouth 2 (two) times daily. 06/04/19   Love, Ivan Anchors, PA-C  MYRBETRIQ 50 MG TB24 tablet TAKE 1 TABLET BY MOUTH EVERY DAY Patient taking differently: Take 50 mg by mouth daily.  04/21/19   Kathrynn Ducking, MD  neomycin-bacitracin-polymyxin (NEOSPORIN) ointment Apply 1 application  topically daily as needed for wound care.     [provider]  pantoprazole (PROTONIX) 40 MG tablet Take 40 mg by mouth daily. 02/22/17   [provider]  potassium chloride SA (K-DUR) 20 MEQ tablet Take 1 tablet (20 mEq total) by mouth daily. 06/04/19   Love, Ivan Anchors, PA-C  rosuvastatin (CRESTOR) 40 MG tablet Take 1 tablet (40 mg total) by mouth daily. Patient taking differently: Take 40 mg by mouth every evening.  12/25/18   [name], MD  sertraline (ZOLOFT) 100 MG tablet Take 200 mg by mouth daily.     [provider]  tamsulosin (FLOMAX) 0.4 MG CAPS capsule Take 0.4 mg by mouth at bedtime.     [provider]  torsemide (DEMADEX) 20 MG tablet Take 1 tablet (20 mg total) by mouth 2 (two) times daily. 06/23/19 09/21/19  [name], MD    ALLERGIES:  Allergies  Allergen Reactions  . Lipitor [Atorvastatin] Other (See Comments)    Stiff joints  . 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 Warfarin And Related Other (See Comments)    Stiff joints  . Enbrel [Etanercept] Itching    SOCIAL HISTORY:  Social History   Tobacco Use  . Smoking status: Former Smoker    Quit date: 02/21/1991    Years since quitting: 28.3  . Smokeless tobacco: Never Used  Substance Use Topics  . Alcohol use: No    Alcohol/week: 0.0 standard drinks    FAMILY HISTORY: Family History  Problem Relation Age of Onset  . Heart disease Father   . Heart attack Father   . Heart failure Father   . Heart disease Mother   . Alzheimer's disease Mother   . Dementia Mother     EXAM: BP (!) 97/53 (BP Location: Left Arm)   Pulse (!) 58   Temp 98.2 F (36.8 C) (Oral)   Resp 16   Ht 5\' 8"  (1.727 m)   Wt 80.3 kg   SpO2 100%   BMI 26.91 kg/m  CONSTITUTIONAL: Alert and oriented and responds appropriately to questions. Well-appearing; well-nourished; GCS 15, elderly, no distress HEAD: Normocephalic; right periorbital ecchymosis and swelling, skin tear  to the right forehead EYES: Conjunctivae clear, PERRL, EOMI, small medial subconjunctival hemorrhage, no hyphema or hypopyon, no foreign body appreciated ENT: normal nose; no rhinorrhea; moist mucous membranes; pharynx without lesions noted; no dental injury; no septal hematoma NECK: Supple, no meningismus, no LAD; no midline spinal tenderness, step-off or deformity; trachea midline CARD: RRR; S1 and S2 appreciated; no murmurs, no clicks, no rubs, no gallops RESP: Normal chest excursion without splinting or tachypnea; breath sounds clear and equal bilaterally; no wheezes, no rhonchi, no rales; no hypoxia or respiratory distress CHEST:  chest wall stable,  no crepitus or ecchymosis or deformity, nontender to palpation; no flail chest ABD/GI: Normal bowel sounds; non-distended; soft, non-tender, no rebound, no guarding; no ecchymosis or other lesions noted PELVIS:  stable, nontender to palpation BACK:  The back appears normal and is non-tender to palpation, there is no CVA tenderness; no midline spinal tenderness, step-off or deformity EXT: Normal ROM in all joints; non-tender to palpation; no edema; normal capillary refill; no cyanosis, no bony tenderness or bony deformity of patient's extremities, no joint effusion, compartments are soft, extremities are warm and well-perfused, no ecchymosis, skin tear to the right elbow without bony tenderness or joint effusion.  He has full range of motion in the right elbow.  Patient has a 2 cm laceration to the distal aspect of the right fingertip.  He has full flexion and extension in this fingertip.  Normal capillary refill.  2+ right radial pulse.  Small abrasion to the right knee but no bony tenderness, effusion and full range of motion in the right knee.  2+ right DP pulse. SKIN: Normal color for age and race; warm NEURO: Moves all extremities equally, normal sensation diffusely, cranial nerves II through XII intact, normal speech PSYCH: The patient's mood and  manner are appropriate. Grooming and personal hygiene are appropriate.  MEDICAL DECISION MAKING: Patient here after mechanical fall.  CT of head obtained in triage shows no acute abnormality.  Will obtain CT of the face and cervical spine.  We will clean his skin tears and apply bacitracin and nonadherent dressing.  Will update his tetanus vaccination.  He will need laceration repair to his right second digit.  ED PROGRESS: CT cervical spine and face show no acute abnormality other than right periorbital hematoma.  Skin tears have been cleaned and dressed.  His right second digit laceration has been repaired.  Feel he is safe to be discharged home with his wife.  Discussed head injury return precautions.  Discussed wound care instructions.  They are comfortable with this plan.   At this time, I do not feel there is any life-threatening condition present. I have reviewed and discussed all results (EKG, imaging, lab, urine as appropriate) and exam findings with patient/family. I have reviewed nursing notes and appropriate previous records.  I feel the patient is safe to be discharged home without further emergent workup and can continue workup as an outpatient as needed. Discussed usual and customary return precautions. Patient/family verbalize understanding and are comfortable with this plan.  Outpatient follow-up has been provided as needed. All questions have been answered.    LACERATION REPAIR Performed by: Pryor Curia Authorized by: Pryor Curia Consent: Verbal consent obtained. Risks and benefits: risks, benefits and alternatives were discussed Consent given by: patient Patient identity confirmed: provided demographic data Prepped and Draped in normal sterile fashion Wound explored  Laceration Location: Right second digit  Laceration Length: 2cm  No Foreign Bodies seen or palpated  Anesthesia: local infiltration  Local anesthetic: lidocaine 1 % without epinephrine  Anesthetic  total: 5 ml  Irrigation method: syringe Amount of cleaning: standard  Skin closure: Simple  Number of sutures: 4  Technique: Area anesthetized using lidocaine 1% without epinephrine. Wound irrigated copiously with sterile saline. Wound then cleaned with Betadine and draped in sterile fashion. Wound closed using 4 simple interrupted sutures with 4-0 Prolene.  Bacitracin and sterile dressing applied. Good wound approximation and hemostasis achieved.   Patient tolerance: Patient tolerated the procedure well with no immediate complications.      Ward, Cyril Mourning  N, DO 07/06/19 0300

## 2019-07-06 NOTE — Discharge Instructions (Signed)
You may take Tylenol 1000 mg every 6 hours as needed for pain.  I recommend that you apply ice to your right eye several times a day which will help with swelling and bruising.  You should have the stitches removed from your right index finger in 1 week.  This can be done by your primary care doctor or you may return to the emergency department to have this done.  You may clean your wounds gently with warm soap and water and reapply bandages as needed.

## 2019-07-07 ENCOUNTER — Telehealth: Payer: Self-pay | Admitting: Physical Medicine & Rehabilitation

## 2019-07-07 ENCOUNTER — Telehealth: Payer: Self-pay | Admitting: Internal Medicine

## 2019-07-07 ENCOUNTER — Ambulatory Visit (INDEPENDENT_AMBULATORY_CARE_PROVIDER_SITE_OTHER): Payer: Medicare Other | Admitting: *Deleted

## 2019-07-07 DIAGNOSIS — E1121 Type 2 diabetes mellitus with diabetic nephropathy: Secondary | ICD-10-CM | POA: Diagnosis not present

## 2019-07-07 DIAGNOSIS — M109 Gout, unspecified: Secondary | ICD-10-CM | POA: Diagnosis not present

## 2019-07-07 DIAGNOSIS — F339 Major depressive disorder, recurrent, unspecified: Secondary | ICD-10-CM | POA: Diagnosis not present

## 2019-07-07 DIAGNOSIS — N400 Enlarged prostate without lower urinary tract symptoms: Secondary | ICD-10-CM | POA: Diagnosis not present

## 2019-07-07 DIAGNOSIS — R600 Localized edema: Secondary | ICD-10-CM | POA: Diagnosis not present

## 2019-07-07 DIAGNOSIS — G3183 Dementia with Lewy bodies: Secondary | ICD-10-CM

## 2019-07-07 DIAGNOSIS — I10 Essential (primary) hypertension: Secondary | ICD-10-CM | POA: Diagnosis not present

## 2019-07-07 DIAGNOSIS — D61818 Other pancytopenia: Secondary | ICD-10-CM | POA: Diagnosis not present

## 2019-07-07 DIAGNOSIS — R5381 Other malaise: Secondary | ICD-10-CM

## 2019-07-07 DIAGNOSIS — F02818 Dementia in other diseases classified elsewhere, unspecified severity, with other behavioral disturbance: Secondary | ICD-10-CM

## 2019-07-07 DIAGNOSIS — F0281 Dementia in other diseases classified elsewhere with behavioral disturbance: Secondary | ICD-10-CM

## 2019-07-07 DIAGNOSIS — I5033 Acute on chronic diastolic (congestive) heart failure: Secondary | ICD-10-CM

## 2019-07-07 DIAGNOSIS — M1990 Unspecified osteoarthritis, unspecified site: Secondary | ICD-10-CM | POA: Diagnosis not present

## 2019-07-07 DIAGNOSIS — Z9181 History of falling: Secondary | ICD-10-CM | POA: Diagnosis not present

## 2019-07-07 DIAGNOSIS — R413 Other amnesia: Secondary | ICD-10-CM | POA: Diagnosis not present

## 2019-07-07 DIAGNOSIS — N189 Chronic kidney disease, unspecified: Secondary | ICD-10-CM | POA: Diagnosis not present

## 2019-07-07 DIAGNOSIS — I2510 Atherosclerotic heart disease of native coronary artery without angina pectoris: Secondary | ICD-10-CM | POA: Diagnosis not present

## 2019-07-07 DIAGNOSIS — J8410 Pulmonary fibrosis, unspecified: Secondary | ICD-10-CM | POA: Diagnosis not present

## 2019-07-07 NOTE — Progress Notes (Signed)
F/u wound check. Steri-strips removed by pt last week. No pain, bruising, drainage , redness or edema at the wound site. Reports edges of wound are approximated. Education on s/sx of infection and when to call DC.

## 2019-07-07 NOTE — Telephone Encounter (Signed)
Referral placed.

## 2019-07-07 NOTE — Telephone Encounter (Signed)
F/u wound check. Steri-strips removed by pt last week. No pain, bruising, drainage , redness or edema at the wound site. Reports edges of wound are approximated. Education on s/sx of infection and when to call DC.

## 2019-07-07 NOTE — Telephone Encounter (Signed)
[name] PT with [facility] needs to get a referral sent over to outpt Neuro Rehab for patient.  Any questions please call her at [phone].

## 2019-07-08 ENCOUNTER — Ambulatory Visit (INDEPENDENT_AMBULATORY_CARE_PROVIDER_SITE_OTHER): Payer: Medicare Other | Admitting: Orthopedic Surgery

## 2019-07-08 ENCOUNTER — Other Ambulatory Visit: Payer: Self-pay

## 2019-07-08 ENCOUNTER — Encounter: Payer: Self-pay | Admitting: Orthopedic Surgery

## 2019-07-08 VITALS — Ht 68.0 in | Wt 177.0 lb

## 2019-07-08 DIAGNOSIS — I6330 Cerebral infarction due to thrombosis of unspecified cerebral artery: Secondary | ICD-10-CM | POA: Diagnosis not present

## 2019-07-08 DIAGNOSIS — F0281 Dementia in other diseases classified elsewhere with behavioral disturbance: Secondary | ICD-10-CM | POA: Diagnosis not present

## 2019-07-08 DIAGNOSIS — G301 Alzheimer's disease with late onset: Secondary | ICD-10-CM

## 2019-07-08 DIAGNOSIS — I872 Venous insufficiency (chronic) (peripheral): Secondary | ICD-10-CM

## 2019-07-08 DIAGNOSIS — F0280 Dementia in other diseases classified elsewhere without behavioral disturbance: Secondary | ICD-10-CM | POA: Diagnosis not present

## 2019-07-08 DIAGNOSIS — B351 Tinea unguium: Secondary | ICD-10-CM

## 2019-07-08 DIAGNOSIS — G3183 Dementia with Lewy bodies: Secondary | ICD-10-CM | POA: Diagnosis not present

## 2019-07-08 DIAGNOSIS — I110 Hypertensive heart disease with heart failure: Secondary | ICD-10-CM | POA: Diagnosis not present

## 2019-07-08 DIAGNOSIS — I2510 Atherosclerotic heart disease of native coronary artery without angina pectoris: Secondary | ICD-10-CM | POA: Diagnosis not present

## 2019-07-08 DIAGNOSIS — I69398 Other sequelae of cerebral infarction: Secondary | ICD-10-CM | POA: Diagnosis not present

## 2019-07-08 DIAGNOSIS — R2689 Other abnormalities of gait and mobility: Secondary | ICD-10-CM | POA: Diagnosis not present

## 2019-07-08 NOTE — Progress Notes (Signed)
Office Visit Note   Patient: [name].           Date of Birth: October 05, 1941           MRN: [id number] Visit Date: 07/08/2019              Requested by: [name], MD [address] [place],  [place] [address] PCP: [name], MD  Chief Complaint  Patient presents with  . Left Foot - Routine Post Op    Per Dr Marlou Sa 05/17/19 toenail issue      HPI: Patient is a 78 year old gentleman who is seen for evaluation of both feet.  Patient is status post a toe amputation with Dr. Marlou Sa.  Patient has Alzheimer's dementia and venous insufficiency and presents with painful onychomycotic nails x7.  Assessment & Plan: Visit Diagnoses:  1. Onychomycosis   2. Late onset Alzheimer's disease without behavioral disturbance (HCC)     Plan: Recommended knee-high medical compression stockings size large.  Follow-up in 3 months to reevaluate the nails.  Follow-Up Instructions: Return in about 3 months (around 10/08/2019).   Ortho Exam  Patient is alert, oriented, no adenopathy, well-dressed, normal affect, normal respiratory effort. Examination patient has brawny skin color changes with some weeping edema from venous insufficiency both legs but no cellulitis no signs of infection.  Patient has no plantar ulcers he has thickened discolored onychomycotic nails x7 he is unable to trim them on his own due to his Alzheimer's dementia and the nails were trimmed x7 without complication.  Imaging: No results found. No images are attached to the encounter.  Labs: Lab Results  Component Value Date   HGBA1C 5.6 05/21/2019   HGBA1C 5.7 (H) 07/30/2012   HGBA1C  01/07/2008    5.3 (NOTE)   The ADA recommends the following therapeutic goals for glycemic   control related to Hgb A1C measurement:   Goal of Therapy:   < 7.0% Hgb A1C   Action Suggested:  > 8.0% Hgb A1C   Ref:  Diabetes Care, 22, Suppl. 1, 1999   ESRSEDRATE 40 (H) 05/01/2019   ESRSEDRATE 45 (H) 12/16/2018   ESRSEDRATE  18 06/18/2018   CRP <0.5 06/16/2013   LABURIC 3.9 12/16/2018   REPTSTATUS 05/20/2019 FINAL 05/15/2019   CULT  05/15/2019    NO GROWTH 5 DAYS Performed at [facility] [address], [place], [place] [address]      Lab Results  Component Value Date   ALBUMIN 3.5 05/27/2019   ALBUMIN 3.2 (L) 05/26/2019   ALBUMIN 3.3 (L) 05/25/2019   LABURIC 3.9 12/16/2018    Lab Results  Component Value Date   MG 2.0 05/26/2019   MG 2.1 05/25/2019   MG 2.2 05/23/2019   No results found for: VD25OH  No results found for: PREALBUMIN CBC EXTENDED Latest Ref Rng & Units 06/27/2019 05/29/2019 05/27/2019  WBC 4.0 - 10.5 K/uL 4.0 6.4 5.2  RBC 4.22 - 5.81 MIL/uL 3.56(L) 4.02(L) 4.08(L)  HGB 13.0 - 17.0 g/dL 10.4(L) 11.2(L) 11.7(L)  HCT 39.0 - 52.0 % 32.3(L) 35.7(L) 36.9(L)  PLT 150 - 400 K/uL 75(L) 124(L) 90(L)  NEUTROABS 1.7 - 7.7 K/uL 2.7 - 3.8  LYMPHSABS 0.7 - 4.0 K/uL 0.9 - 0.9     Body mass index is 26.91 kg/m.  Orders:  No orders of the defined types were placed in this encounter.  No orders of the defined types were placed in this encounter.    Procedures: No procedures  performed  Clinical Data: No additional findings.  ROS:  All other systems negative, except as noted in the HPI. Review of Systems  Objective: Vital Signs: Ht 5\' 8"  (1.727 m)   Wt 177 lb (80.3 kg)   BMI 26.91 kg/m   Specialty Comments:  No specialty comments available.  PMFS History: Patient Active Problem List   Diagnosis Date Noted  . Acute on chronic diastolic heart failure ([place]) 06/03/2019  . Coronary artery disease involving native heart without angina pectoris 06/03/2019  . Debility 05/26/2019  . 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 Toe infection 05/17/2019  . DOE (dyspnea on exertion) 05/15/2019  . Severe tricuspid regurgitation 05/15/2019  . Acute on chronic right-sided heart failure ([place]) 05/15/2019  . Cellulitis of fourth toe of left foot   . Excessive  daytime sleepiness 02/11/2019  . 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[place]) 02/11/2019  . Iron deficiency anemia 02/04/2019  . Poor memory 12/16/2018  . Deficiency anemia 11/19/2018  . Other fatigue 11/19/2018  . History of elevated PSA 11/19/2018  . Prostate cancer screening 11/19/2018  . Prostate CA ([place]) 11/19/2018  . Pancytopenia, acquired ([place]) 08/13/2018  . Recurrent falls 08/13/2018  . 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 Peripheral neuropathy 07/31/2018  . 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 Anemia, chronic disease 01/28/2018  . History of skin cancer 07/27/2017  . Fatty liver disease, nonalcoholic 47/34/0370  . Thrombocytopenia ([place]) 11/27/2016  . Lung nodule 04/23/2014  . Postinflammatory pulmonary fibrosis ([place]) 06/13/2013  . Long term (current) use of anticoagulants 11/22/2011  . Abdominal bruit 03/07/2011  . Pacemaker-St.Jude   . Hx of CABG   . Hyperlipidemia type II   . OSA (obstructive sleep apnea)   . PACEMAKER, PERMANENT 08/17/2010  . DIABETES MELLITUS 08/16/2010  . HYPERLIPIDEMIA 08/16/2010  . Obesity 08/16/2010  . Essential hypertension 08/16/2010  . Atrial fibrillation ([place]) 08/16/2010  . IRREGULAR HEART RATE 08/16/2010  . Sleep apnea 08/16/2010  . BENIGN PROSTATIC HYPERTROPHY, HX OF 08/16/2010   Past Medical History:  Diagnosis Date  . (HFpEF) heart failure with preserved ejection fraction ([place])    a. 05/2013 Echo: EF 55%, mild LVH, diast dysfxn, Ao sclerosis, mildly dil LA, RV dysfxn (poorly visualized), PASP 9mmHg;  b. 03/2017 Echo: EF 55-60%, no rwma, triv MR, mildly dil RV, mod TR, PASP 29mmHg.  . Atrial fibrillation [facility])    s/p Cox Maze 1/09;  Multaq Rx d/c'd in 2014 due to pulmo fibrosis;  coumadin d/c'd in 2014 due to spontaneous subdural hematoma  . BPH (benign prostatic hyperplasia)   . CAD (coronary artery disease), native coronary artery    a. s/p CABG 12/2007;  b. Myoview 12/2011: EF 66%,  no scar or ischemia; normal.  . DM (diabetes mellitus) ([place])   . Hyperlipidemia type II   . Hypertension   . 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 OSA (obstructive sleep apnea)   . Pacemaker    PPM - St. Jude  . Peripheral neuropathy 07/31/2018  . Pulmonary fibrosis ([place])    Multaq d/c'd 7/14  . Subdural hematoma ([place]) 07/2012   spontaneous;  coumadin d/c'd => no longer a candidate for anticoagulation    Family History  Problem Relation Age of Onset  . Heart disease Father   . Heart attack Father   . Heart failure Father   . Heart disease Mother   . Alzheimer's disease Mother   . Dementia Mother  Past Surgical History:  Procedure Laterality Date  . AMPUTATION Left 05/17/2019   Procedure: AMPUTATION LEFT FOURTH TOE;  Surgeon: Meredith Pel, MD;  Location: [facility];  Service: Orthopedics;  Laterality: Left;  . APPENDECTOMY    . CHOLECYSTECTOMY    . CORONARY ARTERY BYPASS GRAFT     x3 (left internal mammary artery to distal left anterior descending coronary artery, saphenous vain graft to second circumflex marginal branch, saphenous vain graft to posterior descending coronary artery, endoscopic saphenous vain harvest from right thigh) and modified Cox - Maze IV procedure.  [name],MD. Electronically signed CHO/MEDQ D: 01/09/2008/ JOB: [id number] cc:  [name]  . [name]  07/30/2012   Procedure: CRANIOTOMY HEMATOMA EVACUATION SUBDURAL;  Surgeon: [name], MD;  Location: [facility];  Service: Neurosurgery;  Laterality: Right;  Right craniotomy for evacuation of subdural hematoma  . FOOT SURGERY    . HERNIA REPAIR    . ORCHIECTOMY     Left  /  testicular cancer  . PACEMAKER PLACEMENT     PPM - St. Jude  . PPM GENERATOR CHANGEOUT N/A 06/25/2019   Procedure: PPM GENERATOR CHANGEOUT;  Surgeon: Evans Lance, MD;  Location: [place] CV LAB;  Service: Cardiovascular;  Laterality: N/A;   Social History   Occupational  History  . Occupation: Retired- IT trainer  Tobacco Use  . Smoking status: Former Smoker    Quit date: 02/21/1991    Years since quitting: 28.3  . Smokeless tobacco: Never Used  Substance and Sexual Activity  . Alcohol use: No    Alcohol/week: 0.0 standard drinks  . Drug use: No  . Sexual activity: Not Currently

## 2019-07-09 ENCOUNTER — Telehealth: Payer: Self-pay

## 2019-07-09 ENCOUNTER — Telehealth: Payer: Self-pay | Admitting: Neurology

## 2019-07-09 DIAGNOSIS — I2510 Atherosclerotic heart disease of native coronary artery without angina pectoris: Secondary | ICD-10-CM | POA: Diagnosis not present

## 2019-07-09 DIAGNOSIS — R2689 Other abnormalities of gait and mobility: Secondary | ICD-10-CM | POA: Diagnosis not present

## 2019-07-09 DIAGNOSIS — F0281 Dementia in other diseases classified elsewhere with behavioral disturbance: Secondary | ICD-10-CM | POA: Diagnosis not present

## 2019-07-09 DIAGNOSIS — I110 Hypertensive heart disease with heart failure: Secondary | ICD-10-CM | POA: Diagnosis not present

## 2019-07-09 DIAGNOSIS — G3183 Dementia with Lewy bodies: Secondary | ICD-10-CM | POA: Diagnosis not present

## 2019-07-09 DIAGNOSIS — I69398 Other sequelae of cerebral infarction: Secondary | ICD-10-CM | POA: Diagnosis not present

## 2019-07-09 NOTE — Telephone Encounter (Signed)
Beth from [facility] called in and stated the patients wife has stated he has had more hallucinations since he has started the donepezil (ARICEPT) 5 MG tablet.    CB# [phone]

## 2019-07-09 NOTE — Telephone Encounter (Signed)
Elizabeth PT [facility] called requesting verbal orders for an extension for PT for 2xwk X 1wk.  Called her back to approve verbal orders.  Mailbox was full and no answer.    In addition she reported that the patient had a recent fall where he hit his face/head and was seen in the ER.  States aricept medication has been added to his medication list but since either the fall or new medication patient has been having episodes of hallucinations.

## 2019-07-10 ENCOUNTER — Ambulatory Visit: Payer: Medicare Other

## 2019-07-10 DIAGNOSIS — R2689 Other abnormalities of gait and mobility: Secondary | ICD-10-CM | POA: Diagnosis not present

## 2019-07-10 DIAGNOSIS — F0281 Dementia in other diseases classified elsewhere with behavioral disturbance: Secondary | ICD-10-CM | POA: Diagnosis not present

## 2019-07-10 DIAGNOSIS — N281 Cyst of kidney, acquired: Secondary | ICD-10-CM | POA: Diagnosis not present

## 2019-07-10 DIAGNOSIS — G3183 Dementia with Lewy bodies: Secondary | ICD-10-CM | POA: Diagnosis not present

## 2019-07-10 DIAGNOSIS — I110 Hypertensive heart disease with heart failure: Secondary | ICD-10-CM | POA: Diagnosis not present

## 2019-07-10 DIAGNOSIS — I2510 Atherosclerotic heart disease of native coronary artery without angina pectoris: Secondary | ICD-10-CM | POA: Diagnosis not present

## 2019-07-10 DIAGNOSIS — I69398 Other sequelae of cerebral infarction: Secondary | ICD-10-CM | POA: Diagnosis not present

## 2019-07-10 DIAGNOSIS — N189 Chronic kidney disease, unspecified: Secondary | ICD-10-CM | POA: Diagnosis not present

## 2019-07-10 NOTE — Telephone Encounter (Signed)
[name] returned call , may leave detailed message

## 2019-07-10 NOTE — Telephone Encounter (Signed)
I called the patient and talk with the wife.  The patient is having episodes at nighttime after taking the Aricept at bedtime.  The patient may be having vivid dreams at night, not true hallucinations.  I would recommend switching the Aricept to 5 mg in the morning, they are to contact me if he continues to have issues.

## 2019-07-10 NOTE — Telephone Encounter (Signed)
LFT vm for [name] at [facility] that pt is having hallucinations since starting aricept. LEft vm to call back to discuss.

## 2019-07-10 NOTE — Telephone Encounter (Signed)
I called pts wife Jocelyn Lamer about her husband taking hallucinations. She stated pt takes a lot of meds in the morning. She stated since taking the aricept at [facility] the hallucinations are increasing. I stated pt will be sent to Dr. Jannifer Franklin to be advise. Pts wife verbalized understanding.

## 2019-07-11 ENCOUNTER — Other Ambulatory Visit: Payer: Self-pay | Admitting: Neurology

## 2019-07-11 DIAGNOSIS — I69398 Other sequelae of cerebral infarction: Secondary | ICD-10-CM | POA: Diagnosis not present

## 2019-07-11 DIAGNOSIS — I110 Hypertensive heart disease with heart failure: Secondary | ICD-10-CM | POA: Diagnosis not present

## 2019-07-11 DIAGNOSIS — G3183 Dementia with Lewy bodies: Secondary | ICD-10-CM | POA: Diagnosis not present

## 2019-07-11 DIAGNOSIS — R2689 Other abnormalities of gait and mobility: Secondary | ICD-10-CM | POA: Diagnosis not present

## 2019-07-11 DIAGNOSIS — I2510 Atherosclerotic heart disease of native coronary artery without angina pectoris: Secondary | ICD-10-CM | POA: Diagnosis not present

## 2019-07-11 DIAGNOSIS — F0281 Dementia in other diseases classified elsewhere with behavioral disturbance: Secondary | ICD-10-CM | POA: Diagnosis not present

## 2019-07-12 DIAGNOSIS — F0281 Dementia in other diseases classified elsewhere with behavioral disturbance: Secondary | ICD-10-CM | POA: Diagnosis not present

## 2019-07-12 DIAGNOSIS — R2689 Other abnormalities of gait and mobility: Secondary | ICD-10-CM | POA: Diagnosis not present

## 2019-07-12 DIAGNOSIS — I69398 Other sequelae of cerebral infarction: Secondary | ICD-10-CM | POA: Diagnosis not present

## 2019-07-12 DIAGNOSIS — I2510 Atherosclerotic heart disease of native coronary artery without angina pectoris: Secondary | ICD-10-CM | POA: Diagnosis not present

## 2019-07-12 DIAGNOSIS — I110 Hypertensive heart disease with heart failure: Secondary | ICD-10-CM | POA: Diagnosis not present

## 2019-07-12 DIAGNOSIS — G3183 Dementia with Lewy bodies: Secondary | ICD-10-CM | POA: Diagnosis not present

## 2019-07-14 ENCOUNTER — Other Ambulatory Visit: Payer: Self-pay

## 2019-07-14 ENCOUNTER — Ambulatory Visit (INDEPENDENT_AMBULATORY_CARE_PROVIDER_SITE_OTHER): Payer: Medicare Other | Admitting: Pulmonary Disease

## 2019-07-14 ENCOUNTER — Encounter: Payer: Self-pay | Admitting: Pulmonary Disease

## 2019-07-14 VITALS — BP 124/64 | HR 69 | Temp 97.6°F | Ht 68.0 in | Wt 178.8 lb

## 2019-07-14 DIAGNOSIS — I110 Hypertensive heart disease with heart failure: Secondary | ICD-10-CM | POA: Diagnosis not present

## 2019-07-14 DIAGNOSIS — I2510 Atherosclerotic heart disease of native coronary artery without angina pectoris: Secondary | ICD-10-CM | POA: Diagnosis not present

## 2019-07-14 DIAGNOSIS — R0609 Other forms of dyspnea: Secondary | ICD-10-CM | POA: Diagnosis not present

## 2019-07-14 DIAGNOSIS — I69398 Other sequelae of cerebral infarction: Secondary | ICD-10-CM | POA: Diagnosis not present

## 2019-07-14 DIAGNOSIS — J84112 Idiopathic pulmonary fibrosis: Secondary | ICD-10-CM | POA: Diagnosis not present

## 2019-07-14 DIAGNOSIS — R2689 Other abnormalities of gait and mobility: Secondary | ICD-10-CM | POA: Diagnosis not present

## 2019-07-14 DIAGNOSIS — G3183 Dementia with Lewy bodies: Secondary | ICD-10-CM | POA: Diagnosis not present

## 2019-07-14 DIAGNOSIS — F0281 Dementia in other diseases classified elsewhere with behavioral disturbance: Secondary | ICD-10-CM | POA: Diagnosis not present

## 2019-07-14 NOTE — Progress Notes (Signed)
Subjective:    Patient ID: [name]., male    DOB: 23-Mar-1941, 78 y.o.   MRN: [id number]  Patient being seen for concern for pulmonary fibrosis Admits to a cough, shortness of breath with activity  Treated in the past for congestive heart failure Recently treated in the hospital following a fall-was accidental  Chronic cough  Did smoke in the past, quit about 28 years ago, less than a pack a day Other significant comorbidities  Denies any history of chronic skin rash No history of arthritis No history of inflammatory bowel disease No history of the vasculitis known to him  No chest pains or chest discomfort  He does have a history of swallowing difficulty Possibly undiagnosed reflux  Past Medical History:  Diagnosis Date  . (HFpEF) heart failure with preserved ejection fraction ([place])    a. 05/2013 Echo: EF 55%, mild LVH, diast dysfxn, Ao sclerosis, mildly dil LA, RV dysfxn (poorly visualized), PASP 5mmHg;  b. 03/2017 Echo: EF 55-60%, no rwma, triv MR, mildly dil RV, mod TR, PASP 22mmHg.  . Atrial fibrillation [facility])    s/p Cox Maze 1/09;  Multaq Rx d/c'd in 2014 due to pulmo fibrosis;  coumadin d/c'd in 2014 due to spontaneous subdural hematoma  . BPH (benign prostatic hyperplasia)   . CAD (coronary artery disease), native coronary artery    a. s/p CABG 12/2007;  b. Myoview 12/2011: EF 66%, no scar or ischemia; normal.  . DM (diabetes mellitus) ([place])   . Hyperlipidemia type II   . Hypertension   . 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 OSA (obstructive sleep apnea)   . Pacemaker    PPM - St. Jude  . Peripheral neuropathy 07/31/2018  . Pulmonary fibrosis ([place])    Multaq d/c'd 7/14  . Subdural hematoma ([place]) 07/2012   spontaneous;  coumadin d/c'd => no longer a candidate for anticoagulation   Family History  Problem Relation Age of Onset  . Heart disease Father   . Heart attack Father   . Heart failure Father   . Heart disease  Mother   . Alzheimer's disease Mother   . Dementia Mother    Reformed smoker No pertinent occupational history  Review of Systems  Constitutional: Positive for unexpected weight change. Negative for fever.  HENT: Positive for trouble swallowing. Negative for congestion, dental problem, ear pain, nosebleeds, postnasal drip, rhinorrhea, sinus pressure, sneezing and sore throat.   Eyes: Negative for redness and itching.  Respiratory: Positive for cough and shortness of breath. Negative for chest tightness and wheezing.   Cardiovascular: Positive for leg swelling. Negative for palpitations.  Gastrointestinal: Negative for nausea and vomiting.  Genitourinary: Negative for dysuria.  Musculoskeletal: Positive for joint swelling.  Skin: Negative for rash.  Allergic/Immunologic: Negative.  Negative for environmental allergies, food allergies and immunocompromised state.  Neurological: Negative for headaches.  Hematological: Does not bruise/bleed easily.  Psychiatric/Behavioral: Negative for dysphoric mood. The patient is not nervous/anxious.       Objective:   Physical Exam Constitutional:      Appearance: Normal appearance.  HENT:     Head: Normocephalic and atraumatic.  Eyes:     General:        Right eye: No discharge.        Left eye: No discharge.     Extraocular Movements: Extraocular movements intact.     Pupils: Pupils are equal, round, and reactive to light.  Neck:  Musculoskeletal: Normal range of motion and neck supple.  Cardiovascular:     Rate and Rhythm: Normal rate.     Pulses: Normal pulses.     Heart sounds: Normal heart sounds. No murmur. No friction rub.  Pulmonary:     Effort: Pulmonary effort is normal. No respiratory distress.     Breath sounds: Normal breath sounds. No stridor. No wheezing, rhonchi or rales.  Abdominal:     General: There is no distension.     Palpations: There is no mass.     Tenderness: There is no abdominal tenderness.   Musculoskeletal: Normal range of motion.        General: No swelling or tenderness.  Skin:    General: Skin is warm and dry.     Coloration: Skin is not jaundiced or pale.     Findings: Bruising present.  Neurological:     General: No focal deficit present.     Mental Status: He is alert.     Cranial Nerves: No cranial nerve deficit.  Psychiatric:        Mood and Affect: Mood normal.    Vitals:   07/14/19 1337  BP: 124/64  Pulse: 69  Temp: 97.6 F (36.4 C)  SpO2: 97%   EFT from 2014 shows no obstructive disease CT scan of the chest from 2016 shows mild interstitial lung disease Echocardiogram did reveal cardiomegaly, preserved ejection fraction, diastolic dysfunction  Recent chest x-ray shows increased pulmonary markings concerning for pulmonary fibrosis  Assessment & Plan:  .  Shortness of breath .  Chronic cough .  Concern for pulmonary fibrosis-based on recent x-ray, protracted cough, previous CT scan revealing evidence of early interstitial lung disease  Plan: Obtain a pulmonary function study-this will be compared with previous  Obtain a high-resolution CT scan of the chest  Continue physical therapy treatments  Patient is following up with speech pathology as well for training  We will continue to follow Will follow-up on CT and PFT

## 2019-07-14 NOTE — Patient Instructions (Signed)
Shortness of breath Cough  Concern for pulmonary fibrosis/scarring in the lung  Obtain high-resolution CT scan of the chest Obtain pulmonary function study  I will see you back in the office in about 6 weeks Call with significant concerns  Continue with graded exercises

## 2019-07-15 DIAGNOSIS — I69398 Other sequelae of cerebral infarction: Secondary | ICD-10-CM | POA: Diagnosis not present

## 2019-07-15 DIAGNOSIS — I2510 Atherosclerotic heart disease of native coronary artery without angina pectoris: Secondary | ICD-10-CM | POA: Diagnosis not present

## 2019-07-15 DIAGNOSIS — F0281 Dementia in other diseases classified elsewhere with behavioral disturbance: Secondary | ICD-10-CM | POA: Diagnosis not present

## 2019-07-15 DIAGNOSIS — R2689 Other abnormalities of gait and mobility: Secondary | ICD-10-CM | POA: Diagnosis not present

## 2019-07-15 DIAGNOSIS — G3183 Dementia with Lewy bodies: Secondary | ICD-10-CM | POA: Diagnosis not present

## 2019-07-15 DIAGNOSIS — I110 Hypertensive heart disease with heart failure: Secondary | ICD-10-CM | POA: Diagnosis not present

## 2019-07-16 DIAGNOSIS — G3183 Dementia with Lewy bodies: Secondary | ICD-10-CM | POA: Diagnosis not present

## 2019-07-16 DIAGNOSIS — S0083XD Contusion of other part of head, subsequent encounter: Secondary | ICD-10-CM | POA: Diagnosis not present

## 2019-07-16 DIAGNOSIS — T148XXA Other injury of unspecified body region, initial encounter: Secondary | ICD-10-CM | POA: Diagnosis not present

## 2019-07-16 DIAGNOSIS — I110 Hypertensive heart disease with heart failure: Secondary | ICD-10-CM | POA: Diagnosis not present

## 2019-07-16 DIAGNOSIS — I69398 Other sequelae of cerebral infarction: Secondary | ICD-10-CM | POA: Diagnosis not present

## 2019-07-16 DIAGNOSIS — F0281 Dementia in other diseases classified elsewhere with behavioral disturbance: Secondary | ICD-10-CM | POA: Diagnosis not present

## 2019-07-16 DIAGNOSIS — R2689 Other abnormalities of gait and mobility: Secondary | ICD-10-CM | POA: Diagnosis not present

## 2019-07-16 DIAGNOSIS — W19XXXD Unspecified fall, subsequent encounter: Secondary | ICD-10-CM | POA: Diagnosis not present

## 2019-07-16 DIAGNOSIS — I2510 Atherosclerotic heart disease of native coronary artery without angina pectoris: Secondary | ICD-10-CM | POA: Diagnosis not present

## 2019-07-17 DIAGNOSIS — I110 Hypertensive heart disease with heart failure: Secondary | ICD-10-CM | POA: Diagnosis not present

## 2019-07-17 DIAGNOSIS — I69398 Other sequelae of cerebral infarction: Secondary | ICD-10-CM | POA: Diagnosis not present

## 2019-07-17 DIAGNOSIS — I2510 Atherosclerotic heart disease of native coronary artery without angina pectoris: Secondary | ICD-10-CM | POA: Diagnosis not present

## 2019-07-17 DIAGNOSIS — F0281 Dementia in other diseases classified elsewhere with behavioral disturbance: Secondary | ICD-10-CM | POA: Diagnosis not present

## 2019-07-17 DIAGNOSIS — R2689 Other abnormalities of gait and mobility: Secondary | ICD-10-CM | POA: Diagnosis not present

## 2019-07-17 DIAGNOSIS — G3183 Dementia with Lewy bodies: Secondary | ICD-10-CM | POA: Diagnosis not present

## 2019-07-18 ENCOUNTER — Encounter: Payer: Self-pay | Admitting: Physical Medicine & Rehabilitation

## 2019-07-18 ENCOUNTER — Other Ambulatory Visit: Payer: Self-pay

## 2019-07-18 ENCOUNTER — Encounter: Payer: Medicare Other | Attending: Registered Nurse | Admitting: Physical Medicine & Rehabilitation

## 2019-07-18 VITALS — BP 99/63 | HR 60 | Temp 98.0°F | Ht 68.0 in | Wt 181.0 lb

## 2019-07-18 DIAGNOSIS — I50813 Acute on chronic right heart failure: Secondary | ICD-10-CM | POA: Diagnosis not present

## 2019-07-18 DIAGNOSIS — I69398 Other sequelae of cerebral infarction: Secondary | ICD-10-CM

## 2019-07-18 DIAGNOSIS — R5381 Other malaise: Secondary | ICD-10-CM | POA: Diagnosis not present

## 2019-07-18 DIAGNOSIS — I5033 Acute on chronic diastolic (congestive) heart failure: Secondary | ICD-10-CM | POA: Insufficient documentation

## 2019-07-18 DIAGNOSIS — F0281 Dementia in other diseases classified elsewhere with behavioral disturbance: Secondary | ICD-10-CM | POA: Diagnosis not present

## 2019-07-18 DIAGNOSIS — G3183 Dementia with Lewy bodies: Secondary | ICD-10-CM | POA: Insufficient documentation

## 2019-07-18 DIAGNOSIS — I6330 Cerebral infarction due to thrombosis of unspecified cerebral artery: Secondary | ICD-10-CM | POA: Diagnosis not present

## 2019-07-18 DIAGNOSIS — R269 Unspecified abnormalities of gait and mobility: Secondary | ICD-10-CM | POA: Diagnosis not present

## 2019-07-18 NOTE — Progress Notes (Signed)
Subjective:    Patient ID: Jamie Singleton., male    DOB: [birth year], 78 y.o.   MRN: [id number]  HPI 78 yo male with borderline diabetes, continent atrial fibrillation Lewy body dementia was admitted to [facility] by a cardiology office on 05/15/2019 with shortness of breath cellulitis of the left foot and falls.  Treated for acute on chronic CHF, toe amputation 05/17/2019 per Dr. Alphonzo Severance.  Had episode of dizziness, neurology evaluated the patient CT angiogram of the head was negative.  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 Was admitted to rehabilitation on 05/26/2019 and discharged to home on 06/04/2019.  Since discharge he has had a pacemaker generator change, he also had a fall which resulted in ED visit on 07/03/2019  [address] while sweeping garage with push broom Pt received 4 stitches on index finger, also had skin tear above eye and at right elbow  HH PT finished yesterday  HHRN coming out next week HHSLP coming out for swallow retraining  Seeing ok out of right eye  Dressing and bathing self, wife still feels the patient is unsteady compared to his baseline. Pain Inventory Average Pain 0 Pain Right Now 0 My pain is na  In the last 24 hours, has pain interfered with the following? General activity 0 Relation with others 0 Enjoyment of life 0 What TIME of day is your pain at its worst? na Sleep (in general) Fair  Pain is worse with: na Pain improves with: na Relief from Meds: na  Mobility do you drive?  no  Function retired  Neuro/Psych bladder control problems trouble walking  Prior Studies Any changes since last visit?  no  Physicians involved in your care Any changes since last visit?  yes   Family History  Problem Relation Age of Onset  . Heart disease Father   . Heart attack Father   . Heart failure Father   . Heart disease Mother   . Alzheimer's disease Mother   .  Dementia Mother    Social History   Socioeconomic History  . Marital status: Married    Spouse name: [name]  . Number of children: 2  . Years of education: Not on file  . Highest education level: Not on file  Occupational History  . Occupation: Retired- IT trainer  Social Needs  . Financial resource strain: Not on file  . Food insecurity    Worry: Not on file    Inability: Not on file  . Transportation needs    Medical: Not on file    Non-medical: Not on file  Tobacco Use  . Smoking status: Former Smoker    Quit date: 02/21/1991    Years since quitting: 28.4  . Smokeless tobacco: Never Used  Substance and Sexual Activity  . Alcohol use: No    Alcohol/week: 0.0 standard drinks  . Drug use: No  . Sexual activity: Not Currently  Lifestyle  . Physical activity    Days per week: Not on file    Minutes per session: Not on file  . Stress: Not on file  Relationships  . Social Herbalist on phone: Not on file    Gets together: Not on file    Attends religious service: Not on file    Active member of club or organization: Not on file    Attends meetings of clubs or organizations: Not on file    Relationship  status: Not on file  Other Topics Concern  . Not on file  Social History Narrative   Lives with wife   Right handed    Married with two children.     He is a Engineer, structural.     Past Surgical History:  Procedure Laterality Date  . AMPUTATION Left 05/17/2019   Procedure: AMPUTATION LEFT FOURTH TOE;  Surgeon: Meredith Pel, MD;  Location: [facility];  Service: Orthopedics;  Laterality: Left;  . APPENDECTOMY    . CHOLECYSTECTOMY    . CORONARY ARTERY BYPASS GRAFT     x3 (left internal mammary artery to distal left anterior descending coronary artery, saphenous vain graft to second circumflex marginal branch, saphenous vain graft to posterior descending coronary artery, endoscopic saphenous vain harvest from right thigh) and modified Cox - Maze IV procedure.   [name],MD. Electronically signed CHO/MEDQ D: 01/09/2008/ JOB: [id number] cc:  [name]  . [name]  07/30/2012   Procedure: CRANIOTOMY HEMATOMA EVACUATION SUBDURAL;  Surgeon: [name], MD;  Location: [place] [facility];  Service: Neurosurgery;  Laterality: Right;  Right craniotomy for evacuation of subdural hematoma  . FOOT SURGERY    . HERNIA REPAIR    . ORCHIECTOMY     Left  /  testicular cancer  . PACEMAKER PLACEMENT     PPM - St. Jude  . PPM GENERATOR CHANGEOUT N/A 06/25/2019   Procedure: PPM GENERATOR CHANGEOUT;  Surgeon: Evans Lance, MD;  Location: [facility];  Service: Cardiovascular;  Laterality: N/A;   Past Medical History:  Diagnosis Date  . (HFpEF) heart failure with preserved ejection fraction ([place])    a. 05/2013 Echo: EF 55%, mild LVH, diast dysfxn, Ao sclerosis, mildly dil LA, RV dysfxn (poorly visualized), PASP 22mmHg;  b. 03/2017 Echo: EF 55-60%, no rwma, triv MR, mildly dil RV, mod TR, PASP 35mmHg.  . Atrial fibrillation [facility])    s/p Cox Maze 1/09;  Multaq Rx d/c'd in 2014 due to pulmo fibrosis;  coumadin d/c'd in 2014 due to spontaneous subdural hematoma  . BPH (benign prostatic hyperplasia)   . CAD (coronary artery disease), native coronary artery    a. s/p CABG 12/2007;  b. Myoview 12/2011: EF 66%, no scar or ischemia; normal.  . DM (diabetes mellitus) ([place])   . Hyperlipidemia type II   . Hypertension   . 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 OSA (obstructive sleep apnea)   . Pacemaker    PPM - St. Jude  . Peripheral neuropathy 07/31/2018  . Pulmonary fibrosis ([place])    Multaq d/c'd 7/14  . Subdural hematoma ([place]) 07/2012   spontaneous;  coumadin d/c'd => no longer a candidate for anticoagulation   BP 99/63   Pulse 60   Temp 98 F (36.7 C)   Ht 5\' 8"  (1.727 m)   Wt 181 lb (82.1 kg)   SpO2 97%   BMI 27.52 kg/m   Opioid Risk Score:   Fall Risk Score:  `1  Depression screen PHQ 2/9  Depression  screen PHQ 2/9 06/17/2019  Decreased Interest 0  Down, Depressed, Hopeless 0  PHQ - 2 Score 0  Altered sleeping 0  Tired, decreased energy 0  Change in appetite 0  Feeling bad or failure about yourself  0  Trouble concentrating 0  Moving slowly or fidgety/restless 0  Suicidal thoughts 0  PHQ-9 Score 0  Some recent data might be hidden  Review of Systems  Constitutional: Negative.   HENT: Negative.   Eyes: Negative.   Respiratory: Negative.   Cardiovascular: Negative.   Gastrointestinal: Negative.   Endocrine: Negative.   Genitourinary: Positive for difficulty urinating.  Musculoskeletal: Positive for gait problem.  Skin: Negative.   Allergic/Immunologic: Negative.   Hematological: Bruises/bleeds easily.  Psychiatric/Behavioral: Negative.   All other systems reviewed and are negative.      Objective:   Physical Exam Vitals signs and nursing note reviewed.  Constitutional:      Appearance: Normal appearance.  Eyes:     Extraocular Movements: Extraocular movements intact.     Pupils: Pupils are equal, round, and reactive to light.      Comments: Right subconjunctival hemorrhage  Musculoskeletal:     Comments: No pain with upper extremity range of motion no pain with neck range of motion no pain with lower extremity range of motion.  Neurological:     Mental Status: He is alert.     Coordination: Romberg sign positive.     Gait: Gait abnormal and tandem walk abnormal.     Comments: 5/5 bilateral deltoid bicep tricep grip hip flexion extension ankle dorsiflexion  The patient has a widened base of support.  He is unable to perform tandem gait.  He requires supervision for ambulation without device.     Motor strength is 5/5 bilateral deltoid bicep tricep grip hip flexion extension ankle dorsiflexion.      Assessment & Plan:  1.  History of brainstem infarct with residual balance disorder.  He has mild dementia which contributes to his poor safety awareness.   He has completed inpatient rehabilitation as well as home health rehabilitation but is still not back to baseline in terms of his balance.  Will refer to outpatient PT.  I will see him back in 6 weeks Advised no yardwork or housework.  Use walker with ambulation at all times

## 2019-07-18 NOTE — Patient Instructions (Signed)
Referral to outpt PT

## 2019-07-21 DIAGNOSIS — G3183 Dementia with Lewy bodies: Secondary | ICD-10-CM | POA: Diagnosis not present

## 2019-07-21 DIAGNOSIS — F0281 Dementia in other diseases classified elsewhere with behavioral disturbance: Secondary | ICD-10-CM | POA: Diagnosis not present

## 2019-07-21 DIAGNOSIS — R2689 Other abnormalities of gait and mobility: Secondary | ICD-10-CM | POA: Diagnosis not present

## 2019-07-21 DIAGNOSIS — I2510 Atherosclerotic heart disease of native coronary artery without angina pectoris: Secondary | ICD-10-CM | POA: Diagnosis not present

## 2019-07-21 DIAGNOSIS — I69398 Other sequelae of cerebral infarction: Secondary | ICD-10-CM | POA: Diagnosis not present

## 2019-07-21 DIAGNOSIS — I110 Hypertensive heart disease with heart failure: Secondary | ICD-10-CM | POA: Diagnosis not present

## 2019-07-22 ENCOUNTER — Telehealth: Payer: Self-pay | Admitting: Neurology

## 2019-07-22 DIAGNOSIS — I110 Hypertensive heart disease with heart failure: Secondary | ICD-10-CM | POA: Diagnosis not present

## 2019-07-22 DIAGNOSIS — F0281 Dementia in other diseases classified elsewhere with behavioral disturbance: Secondary | ICD-10-CM | POA: Diagnosis not present

## 2019-07-22 DIAGNOSIS — R2689 Other abnormalities of gait and mobility: Secondary | ICD-10-CM | POA: Diagnosis not present

## 2019-07-22 DIAGNOSIS — I2510 Atherosclerotic heart disease of native coronary artery without angina pectoris: Secondary | ICD-10-CM | POA: Diagnosis not present

## 2019-07-22 DIAGNOSIS — G3183 Dementia with Lewy bodies: Secondary | ICD-10-CM | POA: Diagnosis not present

## 2019-07-22 DIAGNOSIS — I69398 Other sequelae of cerebral infarction: Secondary | ICD-10-CM | POA: Diagnosis not present

## 2019-07-22 NOTE — Telephone Encounter (Signed)
I have blood work results from 30 June 2019.  Glucose is 87, BUN 39, creatinine 1.9, estimated GFR is 37.22, sodium 143, potassium 4.3, chloride 105, CO2 30, calcium 8.9, total protein 8.0, albumin of 3.9, total bili of 0.5, liver enzymes were unremarkable.  Hemoglobin A1c is 5.90.  White blood count 4.2, hemoglobin 10.6, hematocrit 32.7, platelets of 59, MCV of 88.9.

## 2019-07-28 DIAGNOSIS — I69398 Other sequelae of cerebral infarction: Secondary | ICD-10-CM | POA: Diagnosis not present

## 2019-07-28 DIAGNOSIS — R2689 Other abnormalities of gait and mobility: Secondary | ICD-10-CM | POA: Diagnosis not present

## 2019-07-28 DIAGNOSIS — I110 Hypertensive heart disease with heart failure: Secondary | ICD-10-CM | POA: Diagnosis not present

## 2019-07-28 DIAGNOSIS — F0281 Dementia in other diseases classified elsewhere with behavioral disturbance: Secondary | ICD-10-CM | POA: Diagnosis not present

## 2019-07-28 DIAGNOSIS — I2510 Atherosclerotic heart disease of native coronary artery without angina pectoris: Secondary | ICD-10-CM | POA: Diagnosis not present

## 2019-07-28 DIAGNOSIS — G3183 Dementia with Lewy bodies: Secondary | ICD-10-CM | POA: Diagnosis not present

## 2019-07-29 DIAGNOSIS — G3183 Dementia with Lewy bodies: Secondary | ICD-10-CM | POA: Diagnosis not present

## 2019-07-29 DIAGNOSIS — I2510 Atherosclerotic heart disease of native coronary artery without angina pectoris: Secondary | ICD-10-CM | POA: Diagnosis not present

## 2019-07-29 DIAGNOSIS — F0281 Dementia in other diseases classified elsewhere with behavioral disturbance: Secondary | ICD-10-CM | POA: Diagnosis not present

## 2019-07-29 DIAGNOSIS — I110 Hypertensive heart disease with heart failure: Secondary | ICD-10-CM | POA: Diagnosis not present

## 2019-07-29 DIAGNOSIS — R2689 Other abnormalities of gait and mobility: Secondary | ICD-10-CM | POA: Diagnosis not present

## 2019-07-29 DIAGNOSIS — I69398 Other sequelae of cerebral infarction: Secondary | ICD-10-CM | POA: Diagnosis not present

## 2019-08-05 ENCOUNTER — Telehealth: Payer: Self-pay

## 2019-08-05 NOTE — Telephone Encounter (Signed)
Pt needed help hooking up his new monitor. I looked up some instructions and help the pt the best I could.

## 2019-08-07 ENCOUNTER — Ambulatory Visit: Payer: Medicare Other | Admitting: Rehabilitative and Restorative Service Providers"

## 2019-08-07 ENCOUNTER — Ambulatory Visit
Payer: Medicare Other | Attending: Physical Medicine & Rehabilitation | Admitting: Rehabilitative and Restorative Service Providers"

## 2019-08-07 ENCOUNTER — Other Ambulatory Visit: Payer: Self-pay

## 2019-08-07 DIAGNOSIS — R2681 Unsteadiness on feet: Secondary | ICD-10-CM | POA: Diagnosis not present

## 2019-08-07 DIAGNOSIS — R2689 Other abnormalities of gait and mobility: Secondary | ICD-10-CM | POA: Insufficient documentation

## 2019-08-07 DIAGNOSIS — M6281 Muscle weakness (generalized): Secondary | ICD-10-CM | POA: Diagnosis not present

## 2019-08-07 NOTE — Addendum Note (Signed)
Addended by: Rudell Cobb M on: 08/07/2019 07:01 PM   Modules accepted: Orders

## 2019-08-07 NOTE — Therapy (Signed)
[facility] [address] [place], [place], [address] Phone: [phone]   Fax:  [phone]  Physical Therapy Evaluation  Patient Details  Name: [name]. MRN: [id number] Date of Birth: 03/30/1941 Referring Provider (PT): Alysia Penna, MD  CLINIC OPERATION CHANGES: Outpatient Neuro Rehab is open at lower capacity following universal masking, social distancing, and patient screening.  The patient's COVID risk of complications score is 8.  Encounter Date: 08/07/2019  PT End of Session - 08/07/19 1838    Visit Number  1    Number of Visits  17    Date for PT Re-Evaluation  10/06/19    Authorization Type  medicare and AARP supplement    PT Start Time  1750    PT Stop Time  1830    PT Time Calculation (min)  40 min       Past Medical History:  Diagnosis Date  . (HFpEF) heart failure with preserved ejection fraction ([place])    a. 05/2013 Echo: EF 55%, mild LVH, diast dysfxn, Ao sclerosis, mildly dil LA, RV dysfxn (poorly visualized), PASP 21mmHg;  b. 03/2017 Echo: EF 55-60%, no rwma, triv MR, mildly dil RV, mod TR, PASP 40mmHg.  . Atrial fibrillation [facility])    s/p Cox Maze 1/09;  Multaq Rx d/c'd in 2014 due to pulmo fibrosis;  coumadin d/c'd in 2014 due to spontaneous subdural hematoma  . BPH (benign prostatic hyperplasia)   . CAD (coronary artery disease), native coronary artery    a. s/p CABG 12/2007;  b. Myoview 12/2011: EF 66%, no scar or ischemia; normal.  . DM (diabetes mellitus) ([place])   . Hyperlipidemia type II   . Hypertension   . 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 OSA (obstructive sleep apnea)   . Pacemaker    PPM - St. Jude  . Peripheral neuropathy 07/31/2018  . Pulmonary fibrosis ([place])    Multaq d/c'd 7/14  . Subdural hematoma ([place]) 07/2012   spontaneous;  coumadin d/c'd => no longer a candidate for anticoagulation    Past Surgical History:  Procedure  Laterality Date  . AMPUTATION Left 05/17/2019   Procedure: AMPUTATION LEFT FOURTH TOE;  Surgeon: Meredith Pel, MD;  Location: [facility];  Service: Orthopedics;  Laterality: Left;  . APPENDECTOMY    . CHOLECYSTECTOMY    . CORONARY ARTERY BYPASS GRAFT     x3 (left internal mammary artery to distal left anterior descending coronary artery, saphenous vain graft to second circumflex marginal branch, saphenous vain graft to posterior descending coronary artery, endoscopic saphenous vain harvest from right thigh) and modified Cox - Maze IV procedure.  [name],MD. Electronically signed CHO/MEDQ D: 01/09/2008/ JOB: [id number] cc:  [name]  . [name]  07/30/2012   Procedure: CRANIOTOMY HEMATOMA EVACUATION SUBDURAL;  Surgeon: [name], MD;  Location: [facility];  Service: Neurosurgery;  Laterality: Right;  Right craniotomy for evacuation of subdural hematoma  . FOOT SURGERY    . HERNIA REPAIR    . ORCHIECTOMY     Left  /  testicular cancer  . PACEMAKER PLACEMENT     PPM - St. Jude  . PPM GENERATOR CHANGEOUT N/A 06/25/2019   Procedure: PPM GENERATOR CHANGEOUT;  Surgeon: Evans Lance, MD;  Location: [facility];  Service: Cardiovascular;  Laterality: N/A;    There were no vitals filed for this visit.   Subjective Assessment - 08/07/19 1752  Subjective  The patient is s/p brainstem infarct late May 2020 reporting a decline in status since then.  He used a cane before the stroke and is now using a rollator RW.  He has a h/o mild dementia (per MD notes) that limits safety awareness.   His cc:  unsteadiness, legs giving way (happened last week), tired easily.    Pertinent History  Lewy body dementia, debility, CHF, diabetes, CABG.    Patient Stated Goals  The patient was not using the walker before the stroke.    Currently in Pain?  No/denies         [facility] PT Assessment - 08/07/19 1753      Assessment   Medical Diagnosis  stroke, debility, lewy body dementia     Referring Provider (PT)  Alysia Penna, MD    Onset Date/Surgical Date  --   years per wife's report (falling more frequently now).   Hand Dominance  Right    Prior Therapy  has had home health PT/OT/ST-- finished last week      Precautions   Precautions  Fall      Restrictions   Weight Bearing Restrictions  No      Balance Screen   Has the patient fallen in the past 6 months  Yes    How many times?  estimates 22+ falls in the laast 6 months    Has the patient had a decrease in activity level because of a fear of falling?   Yes    Is the patient reluctant to leave their home because of a fear of falling?   No      Home Environment   Living Environment  Private residence    Living Arrangements  Spouse/significant other    Type of [place] to enter    Entrance Stairs-Number of Steps  3    Entrance Stairs-Rails  --   grab bars; hand [place] to live on main level with bedroom/bathroom    Carrier - 4 wheels;Shower seat - built in;Grab bars - toilet      Prior Function   Level of Independence  --   wife providing 24 hour supervision     Sensation   Light Touch  Impaired Detail    Light Touch Impaired Details  --   bilat LEs due ot neuropathy   Additional Comments  toe amputation       Coordination   Finger Nose Finger Test  Iintact with finger to nose; rapid alternating movement with dyscoordination noted      ROM / Strength   AROM / PROM / Strength  AROM;Strength      AROM   Overall AROM   Within functional limits for tasks performed      Strength   Overall Strength Comments  Bilateral shoulder flexion 5/5, shoulder abduction 5/5, elbow flexion 5/5.  Hip flexion 4-/5, knee flexion/extension 5/5, ankle DF 3+/5.      Transfers   Transfers  Sit to Stand;Stand to Sit    Sit to Stand  6: Modified independent (Device/Increase time)    Stand to Sit  6: Modified independent (Device/Increase time)       Ambulation/Gait   Ambulation/Gait  Yes    Ambulation/Gait Assistance  5: Supervision;6: Modified independent (Device/Increase time)    Ambulation/Gait Assistance Details  The patient needs cues to lock wheel locks on rollator.  He  walks mod indep in the home with rollator and forgets it at times.   PT provided supervision in session today for safety.    Ambulation Distance (Feet)  150 Feet    Assistive device  4-wheeled walker    Gait Pattern  Step-through pattern   variable gait pattern   Ambulation Surface  Level;Indoor    Gait velocity  1.47 ft/sec    Stairs  Yes    Stairs Assistance  5: Supervision    Stair Management Technique  Two rails;Alternating pattern    Number of Stairs  4      Standardized Balance Assessment   Standardized Balance Assessment  Berg Balance Test      Berg Balance Test   Sit to Stand  Able to stand without using hands and stabilize independently    Standing Unsupported  Able to stand 2 minutes with supervision    Sitting with Back Unsupported but Feet Supported on Floor or Stool  Able to sit safely and securely 2 minutes    Stand to Sit  Controls descent by using hands    Transfers  Able to transfer safely, definite need of hands    Standing Unsupported with Eyes Closed  Able to stand 10 seconds with supervision    Standing Unsupported with Feet Together  Able to place feet together independently and stand for 1 minute with supervision    From Standing, Reach Forward with Outstretched Arm  Can reach forward >12 cm safely (5")    From Standing Position, Pick up Object from Floor  Able to pick up shoe, needs supervision    From Standing Position, Turn to Look Behind Over each Shoulder  Turn sideways only but maintains balance    Turn 360 Degrees  Needs assistance while turning    Standing Unsupported, Alternately Place Feet on Step/Stool  Able to complete >2 steps/needs minimal assist    Standing Unsupported, One Foot in Front  Able to take small step  independently and hold 30 seconds    Standing on One Leg  Tries to lift leg/unable to hold 3 seconds but remains standing independently    Total Score  35    Berg comment:  35/56 indicating high fall risk                Objective measurements completed on examination: See above findings.              PT Education - 08/07/19 1837    Education Details  sit<.stand per [employer]) Educated  Patient;Spouse    Methods  Explanation;Demonstration;Handout    Comprehension  Returned demonstration;Verbalized understanding       PT Short Term Goals - 08/07/19 1841      PT SHORT TERM GOAL #1   Title  The patient will perform HEP with wife's assist emphasizing LE strength, balance, general mobility.    Time  4    Period  Weeks    Target Date  09/06/19      PT SHORT TERM GOAL #2   Title  The patient will improve Berg balance score from 35/56 to > or equal to 40/56 to demo dec'ing risk for falls.    Time  4    Period  Weeks    Target Date  09/06/19      PT SHORT TERM GOAL #3   Title  The patient will improve gait speed from 1.47 ft/sec to > or equal to 2.0 ft/sec with  rollator RW to demo improving functional mobility and dec'd risk for falls.    Time  4    Period  Weeks    Target Date  09/06/19      PT SHORT TERM GOAL #4   Title  The patient will demo floor<>stand with UE support and supervision due to h/o frequent falls.    Time  4    Period  Weeks    Target Date  09/06/19      PT SHORT TERM GOAL #5   Title  The patient's wife will report improved ability to rise from couch surfaces in the home.    Time  4    Period  Weeks    Target Date  09/06/19        PT Long Term Goals - 08/07/19 1843      PT LONG TERM GOAL #1   Title  The patient will be able to perform HEP progression with wife's assistance.    Time  8    Period  Weeks    Target Date  10/06/19      PT LONG TERM GOAL #2   Title  The patient will perform stair negotiation with  reciprocal pattern + one rail x 4 steps mod indep.    Time  8    Period  Weeks    Target Date  10/06/19      PT LONG TERM GOAL #3   Title  The patient will be able to ambulate in clinic without a device x 200 ft with supervision without loss of balance (as he forgets his device at home).    Time  8    Period  Weeks    Target Date  10/06/19      PT LONG TERM GOAL #4   Title  The patient will improve Berg from 35/56 to > or equal to 44/56 to demo dec'ing risk for falls.    Time  8    Period  Weeks    Target Date  10/06/19             Plan - 08/07/19 1845    Clinical Impression Statement  The patient is a 78 yo male presenting to OP physical therapy with significant medical h/o CABG, CHF, and Lewy Body Dementia s/p CVA May 15, 2019.  His wife reports a h/o frequent falls (estimating >22 in the past 6 months). She also reports he tries things around the house and sometimes she has to follow him to try to keep him safer (he tried to pull weeds outside yesterday but was unable).  PT to address impairments and work on education of his caregiver for carryover to home.    Personal Factors and Comorbidities  Comorbidity 1;Comorbidity 3+;Comorbidity 2;Other    Comorbidities  dementia, CABG, CHF, diabetes, neuropathy  *h/o 22+ falls in past 6 months    Examination-Activity Limitations  Locomotion Level;Squat;Stairs;Transfers;Stand    Examination-Participation Restrictions  Community Activity    Stability/Clinical Decision Making  Unstable/Unpredictable    Clinical Decision Making  High   due to frequent falls, high fall risk, cognitive issues limiting carryover   Rehab Potential  Good   with caregiver involvement.   PT Frequency  2x / week    PT Duration  8 weeks    PT Treatment/Interventions  ADLs/Self Care Home Management;Neuromuscular re-education;Patient/family education;Gait training;Stair training;Functional mobility training;Therapeutic activities;Therapeutic exercise;Balance  training;DME Instruction    PT Next Visit Plan  check sit<>stand HEP; add HEP educating  his wife on assisting, standing balance, safety during gait, work on functional tasks that may be meaninful for home    Consulted and Agree with Plan of Care  Patient       Patient will benefit from skilled therapeutic intervention in order to improve the following deficits and impairments:  Abnormal gait, Decreased activity tolerance, Decreased balance, Decreased strength, Decreased mobility, Decreased safety awareness, Decreased coordination  Visit Diagnosis: Other abnormalities of gait and mobility  Unsteadiness on feet  Muscle weakness (generalized)     Problem List Patient Active Problem List   Diagnosis Date Noted  . Acute on chronic diastolic heart failure ([place]) 06/03/2019  . Coronary artery disease involving native heart without angina pectoris 06/03/2019  . Debility 05/26/2019  . 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 Toe infection 05/17/2019  . DOE (dyspnea on exertion) 05/15/2019  . Severe tricuspid regurgitation 05/15/2019  . Acute on chronic right-sided heart failure ([place]) 05/15/2019  . Cellulitis of fourth toe of left foot   . Excessive daytime sleepiness 02/11/2019  . 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[place]) 02/11/2019  . Iron deficiency anemia 02/04/2019  . Poor memory 12/16/2018  . Deficiency anemia 11/19/2018  . Other fatigue 11/19/2018  . History of elevated PSA 11/19/2018  . Prostate cancer screening 11/19/2018  . Prostate CA ([place]) 11/19/2018  . Pancytopenia, acquired ([place]) 08/13/2018  . Recurrent falls 08/13/2018  . 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 Peripheral neuropathy 07/31/2018  . 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 Anemia, chronic disease 01/28/2018  . History of skin cancer 07/27/2017  . Fatty liver disease, nonalcoholic 04/59/9774  . Thrombocytopenia ([place]) 11/27/2016  . Lung  nodule 04/23/2014  . Postinflammatory pulmonary fibrosis ([place]) 06/13/2013  . Long term (current) use of anticoagulants 11/22/2011  . Abdominal bruit 03/07/2011  . Pacemaker-St.Jude   . Hx of CABG   . Hyperlipidemia type II   . OSA (obstructive sleep apnea)   . PACEMAKER, PERMANENT 08/17/2010  . DIABETES MELLITUS 08/16/2010  . HYPERLIPIDEMIA 08/16/2010  . Obesity 08/16/2010  . Essential hypertension 08/16/2010  . Atrial fibrillation ([place]) 08/16/2010  . IRREGULAR HEART RATE 08/16/2010  . Sleep apnea 08/16/2010  . BENIGN PROSTATIC HYPERTROPHY, HX OF 08/16/2010    Conny Situ, PT 08/07/2019, 6:56 PM  [facility] [address] [place], [place], [address] Phone: [phone]   Fax:  [phone]  Name: Matthewjames Petrasek. MRN: [id number] Date of Birth: Oct 20, 1941

## 2019-08-07 NOTE — Patient Instructions (Signed)
Access Code: [facility]  URL: [email or web address]  Date: 08/07/2019  Prepared by: Rudell Cobb   Exercises Sit to Stand with Armchair - 10 reps - 1 sets - 2x daily - 7x weekly

## 2019-08-11 ENCOUNTER — Other Ambulatory Visit: Payer: Self-pay

## 2019-08-11 ENCOUNTER — Ambulatory Visit: Payer: Medicare Other | Admitting: Physical Therapy

## 2019-08-11 ENCOUNTER — Ambulatory Visit (INDEPENDENT_AMBULATORY_CARE_PROVIDER_SITE_OTHER)
Admission: RE | Admit: 2019-08-11 | Discharge: 2019-08-11 | Disposition: A | Discharge status: Home / Self Care | Payer: Medicare Other | Source: Ambulatory Visit | Attending: Pulmonary Disease | Admitting: Pulmonary Disease

## 2019-08-11 DIAGNOSIS — J84112 Idiopathic pulmonary fibrosis: Secondary | ICD-10-CM | POA: Diagnosis not present

## 2019-08-11 DIAGNOSIS — R2681 Unsteadiness on feet: Secondary | ICD-10-CM | POA: Diagnosis not present

## 2019-08-11 DIAGNOSIS — M6281 Muscle weakness (generalized): Secondary | ICD-10-CM | POA: Diagnosis not present

## 2019-08-11 DIAGNOSIS — R2689 Other abnormalities of gait and mobility: Secondary | ICD-10-CM | POA: Diagnosis not present

## 2019-08-11 DIAGNOSIS — I700 Atherosclerosis of aorta: Secondary | ICD-10-CM | POA: Diagnosis not present

## 2019-08-11 DIAGNOSIS — J479 Bronchiectasis, uncomplicated: Secondary | ICD-10-CM | POA: Diagnosis not present

## 2019-08-12 ENCOUNTER — Telehealth: Payer: Self-pay | Admitting: Neurology

## 2019-08-12 MED ORDER — DONEPEZIL HCL 5 MG PO TABS: 5.0000 mg | ORAL_TABLET | Freq: Every day | ORAL | 3 refills | Status: DC

## 2019-08-12 NOTE — Telephone Encounter (Signed)
The aricept has been called in.

## 2019-08-12 NOTE — Therapy (Signed)
[facility] [address] [place], [place], [address] Phone: [phone]   Fax:  [phone]  Physical Therapy Treatment  Patient Details  Name: [name]. MRN: [id number] Date of Birth: 07/05/1941 Referring Provider (PT): Alysia Penna, MD   Encounter Date: 08/11/2019  PT End of Session - 08/12/19 0809    Visit Number  2    Number of Visits  17    Date for PT Re-Evaluation  10/06/19    Authorization Type  medicare and AARP supplement    PT Start Time  1800   he was 15 min late   PT Stop Time  1830    PT Time Calculation (min)  30 min    Activity Tolerance  Patient tolerated treatment well    Behavior During Therapy  [facility] for tasks assessed/performed       Past Medical History:  Diagnosis Date  . (HFpEF) heart failure with preserved ejection fraction ([place])    a. 05/2013 Echo: EF 55%, mild LVH, diast dysfxn, Ao sclerosis, mildly dil LA, RV dysfxn (poorly visualized), PASP 46mmHg;  b. 03/2017 Echo: EF 55-60%, no rwma, triv MR, mildly dil RV, mod TR, PASP 29mmHg.  . Atrial fibrillation [facility])    s/p Cox Maze 1/09;  Multaq Rx d/c'd in 2014 due to pulmo fibrosis;  coumadin d/c'd in 2014 due to spontaneous subdural hematoma  . BPH (benign prostatic hyperplasia)   . CAD (coronary artery disease), native coronary artery    a. s/p CABG 12/2007;  b. Myoview 12/2011: EF 66%, no scar or ischemia; normal.  . DM (diabetes mellitus) ([place])   . Hyperlipidemia type II   . Hypertension   . 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 OSA (obstructive sleep apnea)   . Pacemaker    PPM - St. Jude  . Peripheral neuropathy 07/31/2018  . 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)    Multaq d/c'd 7/14  . Subdural hematoma ([place]) 07/2012   spontaneous;  coumadin d/c'd => no longer a candidate for anticoagulation    Past Surgical History:  Procedure Laterality Date  . AMPUTATION Left 05/17/2019   Procedure: AMPUTATION  LEFT FOURTH TOE;  Surgeon: Meredith Pel, MD;  Location: [facility];  Service: Orthopedics;  Laterality: Left;  . APPENDECTOMY    . CHOLECYSTECTOMY    . CORONARY ARTERY BYPASS GRAFT     x3 (left internal mammary artery to distal left anterior descending coronary artery, saphenous vain graft to second circumflex marginal branch, saphenous vain graft to posterior descending coronary artery, endoscopic saphenous vain harvest from right thigh) and modified Cox - Maze IV procedure.  [name],MD. Electronically signed CHO/MEDQ D: 01/09/2008/ JOB: [id number] cc:  [name]  . [name]  07/30/2012   Procedure: CRANIOTOMY HEMATOMA EVACUATION SUBDURAL;  Surgeon: [name], MD;  Location: [facility];  Service: Neurosurgery;  Laterality: Right;  Right craniotomy for evacuation of subdural hematoma  . FOOT SURGERY    . HERNIA REPAIR    . ORCHIECTOMY     Left  /  testicular cancer  . PACEMAKER PLACEMENT     PPM - St. Jude  . PPM GENERATOR CHANGEOUT N/A 06/25/2019   Procedure: PPM GENERATOR CHANGEOUT;  Surgeon: Evans Lance, MD;  Location: [facility];  Service: Cardiovascular;  Laterality: N/A;    There were no vitals filed for this visit.  Subjective Assessment - 08/12/19 0804    Subjective  Relays he feels tired today, when asked about pain he states everything hurts but not too bad (does not rate intensity)    Pertinent History  Lewy body dementia, debility, CHF, diabetes, CABG.    Patient Stated Goals  The patient was not using the walker before the stroke.      Neuro rehab today:  sit to stands with supervision, no UE, 2X5 gait with RW 100 ft X 2 with supervision, gait without RW 20 ft needed min A with balance then moved to parallel bars for dynamic balance/gait with headturns, march walk, sidestepping, and retro walk needing intermitt UE assistance of bars for balance.  Stairs up/down  X3 using rails      PT Short Term Goals - 08/07/19 1841      PT SHORT TERM  GOAL #1   Title  The patient will perform HEP with wife's assist emphasizing LE strength, balance, general mobility.    Time  4    Period  Weeks    Target Date  09/06/19      PT SHORT TERM GOAL #2   Title  The patient will improve Berg balance score from 35/56 to > or equal to 40/56 to demo dec'ing risk for falls.    Time  4    Period  Weeks    Target Date  09/06/19      PT SHORT TERM GOAL #3   Title  The patient will improve gait speed from 1.47 ft/sec to > or equal to 2.0 ft/sec with rollator RW to demo improving functional mobility and dec'd risk for falls.    Time  4    Period  Weeks    Target Date  09/06/19      PT SHORT TERM GOAL #4   Title  The patient will demo floor<>stand with UE support and supervision due to h/o frequent falls.    Time  4    Period  Weeks    Target Date  09/06/19      PT SHORT TERM GOAL #5   Title  The patient's wife will report improved ability to rise from couch surfaces in the home.    Time  4    Period  Weeks    Target Date  09/06/19        PT Long Term Goals - 08/07/19 1843      PT LONG TERM GOAL #1   Title  The patient will be able to perform HEP progression with wife's assistance.    Time  8    Period  Weeks    Target Date  10/06/19      PT LONG TERM GOAL #2   Title  The patient will perform stair negotiation with reciprocal pattern + one rail x 4 steps mod indep.    Time  8    Period  Weeks    Target Date  10/06/19      PT LONG TERM GOAL #3   Title  The patient will be able to ambulate in clinic without a device x 200 ft with supervision without loss of balance (as he forgets his device at home).    Time  8    Period  Weeks    Target Date  10/06/19      PT LONG TERM GOAL #4   Title  The patient will improve Berg from 35/56 to > or equal to 44/56 to demo dec'ing risk for falls.    Time  8  Period  Weeks    Target Date  10/06/19            Plan - 08/12/19 0810    Clinical Impression Statement  Session focused on  leg strength and balance progression. He needs min A with most balance exercises so benefited from being in parallel bars for dynamic balance. He was 15 min late today so had a shorter session due to time constraits.    Personal Factors and Comorbidities  Comorbidity 1;Comorbidity 3+;Comorbidity 2;Other    Comorbidities  dementia, CABG, CHF, diabetes, neuropathy  *h/o 22+ falls in past 6 months    Examination-Activity Limitations  Locomotion Level;Squat;Stairs;Transfers;Stand    Examination-Participation Restrictions  Community Activity    Stability/Clinical Decision Making  Unstable/Unpredictable    Rehab Potential  Good   with caregiver involvement.   PT Frequency  2x / week    PT Duration  8 weeks    PT Treatment/Interventions  ADLs/Self Care Home Management;Neuromuscular re-education;Patient/family education;Gait training;Stair training;Functional mobility training;Therapeutic activities;Therapeutic exercise;Balance training;DME Instruction    PT Next Visit Plan  add HEP educating his wife on assisting, standing balance, safety during gait, work on functional tasks that may be meaninful for home    Consulted and Agree with Plan of Care  Patient       Patient will benefit from skilled therapeutic intervention in order to improve the following deficits and impairments:  Abnormal gait, Decreased activity tolerance, Decreased balance, Decreased strength, Decreased mobility, Decreased safety awareness, Decreased coordination  Visit Diagnosis: Other abnormalities of gait and mobility  Unsteadiness on feet  Muscle weakness (generalized)     Problem List Patient Active Problem List   Diagnosis Date Noted  . Acute on chronic diastolic heart failure ([place]) 06/03/2019  . Coronary artery disease involving native heart without angina pectoris 06/03/2019  . Debility 05/26/2019  . 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 Toe infection 05/17/2019  . DOE (dyspnea on exertion)  05/15/2019  . Severe tricuspid regurgitation 05/15/2019  . Acute on chronic right-sided heart failure ([place]) 05/15/2019  . Cellulitis of fourth toe of left foot   . Excessive daytime sleepiness 02/11/2019  . 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[place]) 02/11/2019  . Iron deficiency anemia 02/04/2019  . Poor memory 12/16/2018  . Deficiency anemia 11/19/2018  . Other fatigue 11/19/2018  . History of elevated PSA 11/19/2018  . Prostate cancer screening 11/19/2018  . Prostate CA ([place]) 11/19/2018  . Pancytopenia, acquired ([place]) 08/13/2018  . Recurrent falls 08/13/2018  . 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 Peripheral neuropathy 07/31/2018  . 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 Anemia, chronic disease 01/28/2018  . History of skin cancer 07/27/2017  . Fatty liver disease, nonalcoholic [id number]  . Thrombocytopenia ([place]) 11/27/2016  . Lung nodule 04/23/2014  . Postinflammatory pulmonary fibrosis ([place]) 06/13/2013  . Long term (current) use of anticoagulants 11/22/2011  . Abdominal bruit 03/07/2011  . Pacemaker-St.Jude   . Hx of CABG   . Hyperlipidemia type II   . OSA (obstructive sleep apnea)   . PACEMAKER, PERMANENT 08/17/2010  . DIABETES MELLITUS 08/16/2010  . HYPERLIPIDEMIA 08/16/2010  . Obesity 08/16/2010  . Essential hypertension 08/16/2010  . Atrial fibrillation ([place]) 08/16/2010  . IRREGULAR HEART RATE 08/16/2010  . Sleep apnea 08/16/2010  . BENIGN PROSTATIC HYPERTROPHY, HX OF 08/16/2010    Silvestre Mesi 08/12/2019, 8:17 AM  [facility] [address] [place], [place], [address] Phone: [phone]  Fax:  [phone]  Name: [name]. MRN: [id number] Date of Birth: 17-Dec-1941

## 2019-08-12 NOTE — Telephone Encounter (Signed)
I called pt, spoke to pt's wife, Vickie, per DPR. I received a refill request for pt's donepezil. Dr. Jannifer Franklin asked pt to call us for an update and possible increase on pt's donepezil but pt has not called for an update. Pt's wife thinks that his hallucinations are worsening. Pt's wife also thinks that pt has narcolepsy because he is sleeping "all the time." Pt did receive a new cpap in April but did not follow up with it. Pt's wife does not want to increase pt's donepezil until pt's sleep and hallucinations are addressed. An appt was made with Jinny Blossom, NP for 08/14/19 at 9:30am to address his new cpap and his donepezil. Pt's wife verbalized understanding of new appt date and time.

## 2019-08-12 NOTE — Addendum Note (Signed)
Addended by: Kathrynn Ducking on: 08/12/2019 06:06 PM   Modules accepted: Orders

## 2019-08-13 ENCOUNTER — Other Ambulatory Visit: Payer: Self-pay

## 2019-08-13 ENCOUNTER — Ambulatory Visit: Payer: Medicare Other | Admitting: Physical Therapy

## 2019-08-13 DIAGNOSIS — R2681 Unsteadiness on feet: Secondary | ICD-10-CM

## 2019-08-13 DIAGNOSIS — M6281 Muscle weakness (generalized): Secondary | ICD-10-CM

## 2019-08-13 DIAGNOSIS — R2689 Other abnormalities of gait and mobility: Secondary | ICD-10-CM

## 2019-08-13 DIAGNOSIS — Z8673 Personal history of transient ischemic attack (TIA), and cerebral infarction without residual deficits: Secondary | ICD-10-CM | POA: Diagnosis not present

## 2019-08-13 DIAGNOSIS — I2510 Atherosclerotic heart disease of native coronary artery without angina pectoris: Secondary | ICD-10-CM | POA: Diagnosis not present

## 2019-08-13 DIAGNOSIS — R1314 Dysphagia, pharyngoesophageal phase: Secondary | ICD-10-CM | POA: Diagnosis not present

## 2019-08-13 DIAGNOSIS — I5042 Chronic combined systolic (congestive) and diastolic (congestive) heart failure: Secondary | ICD-10-CM | POA: Diagnosis not present

## 2019-08-13 DIAGNOSIS — I480 Paroxysmal atrial fibrillation: Secondary | ICD-10-CM | POA: Diagnosis not present

## 2019-08-13 NOTE — Therapy (Addendum)
[facility] [address] [place], [place], [address] Phone: [phone]   Fax:  [phone]  Physical Therapy Treatment  Patient Details  Name: [name]. MRN: [id number] Date of Birth: 24-Jul-1941 Referring Provider (PT): Alysia Penna, [name]   Encounter Date: 08/13/2019  PT End of Session - 08/13/19 1515    Visit Number  3    Number of Visits  17    Date for PT Re-Evaluation  10/06/19    Authorization Type  medicare and AARP supplement    PT Start Time  0125   pt 10 min late   PT Stop Time  0203    PT Time Calculation (min)  38 min    Activity Tolerance  Patient tolerated treatment well    Behavior During Therapy  [name] for tasks assessed/performed       Past Medical History:  Diagnosis Date  . (HFpEF) heart failure with preserved ejection fraction ([place])    a. 05/2013 Echo: EF 55%, mild LVH, diast dysfxn, Ao sclerosis, mildly dil LA, RV dysfxn (poorly visualized), PASP 63mmHg;  b. 03/2017 Echo: EF 55-60%, no rwma, triv MR, mildly dil RV, mod TR, PASP 35mmHg.  . Atrial fibrillation [facility])    s/p Cox Maze 1/09;  Multaq Rx d/c'd in 2014 due to pulmo fibrosis;  coumadin d/c'd in 2014 due to spontaneous subdural hematoma  . BPH (benign prostatic hyperplasia)   . CAD (coronary artery disease), native coronary artery    a. s/p CABG 12/2007;  b. Myoview 12/2011: EF 66%, no scar or ischemia; normal.  . DM (diabetes mellitus) ([place])   . Hyperlipidemia type II   . Hypertension   . 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 OSA (obstructive sleep apnea)   . Pacemaker    PPM - St. Jude  . Peripheral neuropathy 07/31/2018  . Pulmonary fibrosis ([place])    Multaq d/c'd 7/14  . Subdural hematoma ([place]) 07/2012   spontaneous;  coumadin d/c'd => no longer a candidate for anticoagulation    Past Surgical History:  Procedure Laterality Date  . AMPUTATION Left 05/17/2019   Procedure: AMPUTATION LEFT  FOURTH TOE;  Surgeon: Meredith Pel, [name];  Location: [facility];  Service: Orthopedics;  Laterality: Left;  . APPENDECTOMY    . CHOLECYSTECTOMY    . CORONARY ARTERY BYPASS GRAFT     x3 (left internal mammary artery to distal left anterior descending coronary artery, saphenous vain graft to second circumflex marginal branch, saphenous vain graft to posterior descending coronary artery, endoscopic saphenous vain harvest from right thigh) and modified Cox - Maze IV procedure.  Jamie Gu. [name],[name]. Electronically signed CHO/MEDQ D: 01/09/2008/ JOB: [id number] cc:  [name] [name]  . [name]  07/30/2012   Procedure: CRANIOTOMY HEMATOMA EVACUATION SUBDURAL;  Surgeon: [name], [name];  Location: [facility];  Service: Neurosurgery;  Laterality: Right;  Right craniotomy for evacuation of subdural hematoma  . FOOT SURGERY    . HERNIA REPAIR    . ORCHIECTOMY     Left  /  testicular cancer  . PACEMAKER PLACEMENT     PPM - St. Jude  . PPM GENERATOR CHANGEOUT N/A 06/25/2019   Procedure: PPM GENERATOR CHANGEOUT;  Surgeon: Evans Lance, [name];  Location: [facility];  Service: Cardiovascular;  Laterality: N/A;    There were no vitals filed for this visit.  Subjective Assessment - 08/13/19 1436    Subjective  Relays  he feels okay today, not as much pain and he is not as tired.      Neuro rehab performed today  Nu step 5 min L5 UE/LE for coordination, reciprocal arm/leg, and endurance sit to stands with close supervision, no UE, 2X10 gait with RW 100 ft X 2 with supervision, gait without RW 10 ft X 3 needed CGA with balance .  Stairs up/down  X 4using rails resisted golf swing for core and balance with red tband anchored high 2X 15, then anchored low 2X 15 corner balance on floor foam pad, FT EO, FA EC, ball toss to self    PT Short Term Goals - 08/07/19 1841      PT SHORT TERM GOAL #1   Title  The patient will perform HEP with wife's assist emphasizing LE strength, balance, general  mobility.    Time  4    Period  Weeks    Target Date  09/06/19      PT SHORT TERM GOAL #2   Title  The patient will improve Berg balance score from 35/56 to > or equal to 40/56 to demo dec'ing risk for falls.    Time  4    Period  Weeks    Target Date  09/06/19      PT SHORT TERM GOAL #3   Title  The patient will improve gait speed from 1.47 ft/sec to > or equal to 2.0 ft/sec with rollator RW to demo improving functional mobility and dec'd risk for falls.    Time  4    Period  Weeks    Target Date  09/06/19      PT SHORT TERM GOAL #4   Title  The patient will demo floor<>stand with UE support and supervision due to h/o frequent falls.    Time  4    Period  Weeks    Target Date  09/06/19      PT SHORT TERM GOAL #5   Title  The patient's wife will report improved ability to rise from couch surfaces in the home.    Time  4    Period  Weeks    Target Date  09/06/19        PT Long Term Goals - 08/07/19 1843      PT LONG TERM GOAL #1   Title  The patient will be able to perform HEP progression with wife's assistance.    Time  8    Period  Weeks    Target Date  10/06/19      PT LONG TERM GOAL #2   Title  The patient will perform stair negotiation with reciprocal pattern + one rail x 4 steps mod indep.    Time  8    Period  Weeks    Target Date  10/06/19      PT LONG TERM GOAL #3   Title  The patient will be able to ambulate in clinic without a device x 200 ft with supervision without loss of balance (as he forgets his device at home).    Time  8    Period  Weeks    Target Date  10/06/19      PT LONG TERM GOAL #4   Title  The patient will improve Berg from 35/56 to > or equal to 44/56 to demo dec'ing risk for falls.    Time  8    Period  Weeks    Target Date  10/06/19  Plan - 08/13/19 1515    Clinical Impression Statement  Progressed with balance, endurance, and gait today as tolerated. He was able to ambulate a little further without needing  rollator today so his balance appears to be improving some.    Personal Factors and Comorbidities  Comorbidity 1;Comorbidity 3+;Comorbidity 2;Other    Comorbidities  dementia, CABG, CHF, diabetes, neuropathy  *h/o 22+ falls in past 6 months    Examination-Activity Limitations  Locomotion Level;Squat;Stairs;Transfers;Stand    Examination-Participation Restrictions  Community Activity    Stability/Clinical Decision Making  Unstable/Unpredictable    Rehab Potential  Good   with caregiver involvement.   PT Frequency  2x / week    PT Duration  8 weeks    PT Treatment/Interventions  ADLs/Self Care Home Management;Neuromuscular re-education;Patient/family education;Gait training;Stair training;Functional mobility training;Therapeutic activities;Therapeutic exercise;Balance training;DME Instruction    PT Next Visit Plan  progress his  HEP educating his wife on assisting, standing balance, safety during gait, work on functional tasks that may be meaninful for home    PT Home Exercise Plan  sit to stands    Consulted and Agree with Plan of Care  Patient       Patient will benefit from skilled therapeutic intervention in order to improve the following deficits and impairments:  Abnormal gait, Decreased activity tolerance, Decreased balance, Decreased strength, Decreased mobility, Decreased safety awareness, Decreased coordination  Visit Diagnosis: Other abnormalities of gait and mobility  Unsteadiness on feet  Muscle weakness (generalized)     Problem List Patient Active Problem List   Diagnosis Date Noted  . Acute on chronic diastolic heart failure ([place]) 06/03/2019  . Coronary artery disease involving native heart without angina pectoris 06/03/2019  . Debility 05/26/2019  . 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 Toe infection 05/17/2019  . DOE (dyspnea on exertion) 05/15/2019  . Severe tricuspid regurgitation 05/15/2019  . Acute on chronic right-sided heart failure  ([place]) 05/15/2019  . Cellulitis of fourth toe of left foot   . Excessive daytime sleepiness 02/11/2019  . 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[place]) 02/11/2019  . Iron deficiency anemia 02/04/2019  . Poor memory 12/16/2018  . Deficiency anemia 11/19/2018  . Other fatigue 11/19/2018  . History of elevated PSA 11/19/2018  . Prostate cancer screening 11/19/2018  . Prostate CA ([place]) 11/19/2018  . Pancytopenia, acquired ([place]) 08/13/2018  . Recurrent falls 08/13/2018  . 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 Peripheral neuropathy 07/31/2018  . 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 Anemia, chronic disease 01/28/2018  . History of skin cancer 07/27/2017  . Fatty liver disease, nonalcoholic [id number]  . Thrombocytopenia ([place]) 11/27/2016  . Lung nodule 04/23/2014  . Postinflammatory pulmonary fibrosis ([place]) 06/13/2013  . Long term (current) use of anticoagulants 11/22/2011  . Abdominal bruit 03/07/2011  . Pacemaker-St.Jude   . Hx of CABG   . Hyperlipidemia type II   . OSA (obstructive sleep apnea)   . PACEMAKER, PERMANENT 08/17/2010  . DIABETES MELLITUS 08/16/2010  . HYPERLIPIDEMIA 08/16/2010  . Obesity 08/16/2010  . Essential hypertension 08/16/2010  . Atrial fibrillation ([place]) 08/16/2010  . IRREGULAR HEART RATE 08/16/2010  . Sleep apnea 08/16/2010  . BENIGN PROSTATIC HYPERTROPHY, HX OF 08/16/2010    [name] 08/13/2019, 3:26 PM  [facility] [address] [place], [place], [address] Phone: [phone]   Fax:  [phone]  Name: [name]. MRN: [id number] Date of Birth: 1941-02-20

## 2019-08-14 ENCOUNTER — Encounter: Payer: Self-pay | Admitting: Adult Health

## 2019-08-14 ENCOUNTER — Ambulatory Visit (INDEPENDENT_AMBULATORY_CARE_PROVIDER_SITE_OTHER): Payer: Medicare Other | Admitting: Adult Health

## 2019-08-14 VITALS — BP 94/60 | HR 59 | Temp 97.7°F | Ht 68.0 in | Wt 186.8 lb

## 2019-08-14 DIAGNOSIS — R413 Other amnesia: Secondary | ICD-10-CM

## 2019-08-14 DIAGNOSIS — Z9989 Dependence on other enabling machines and devices: Secondary | ICD-10-CM | POA: Diagnosis not present

## 2019-08-14 DIAGNOSIS — Z8673 Personal history of transient ischemic attack (TIA), and cerebral infarction without residual deficits: Secondary | ICD-10-CM

## 2019-08-14 DIAGNOSIS — G4733 Obstructive sleep apnea (adult) (pediatric): Secondary | ICD-10-CM

## 2019-08-14 DIAGNOSIS — G4719 Other hypersomnia: Secondary | ICD-10-CM | POA: Diagnosis not present

## 2019-08-14 DIAGNOSIS — I6330 Cerebral infarction due to thrombosis of unspecified cerebral artery: Secondary | ICD-10-CM

## 2019-08-14 MED ORDER — DONEPEZIL HCL 10 MG PO TABS: 10.0000 mg | ORAL_TABLET | Freq: Every day | ORAL | 11 refills | Status: DC

## 2019-08-14 NOTE — Progress Notes (Signed)
PATIENT: [name]. DOB: 11/26/1941  REASON FOR VISIT: follow up HISTORY FROM: patient  HISTORY OF PRESENT ILLNESS: Today 08/14/19:  [name] is a 77 year old male with a history of obstructive sleep apnea on CPAP, stroke and memory disturbance.  He was last seen in July by Dr. Jannifer Franklin following a stroke event.  The patient and his wife is concerned as he is sleeping a lot during the day.  His CPAP download indicates that he uses machine 59 out of 90 days for compliance of 66%.  He uses machine greater than 4 hours only 47 days for compliance of 52%.  On average he uses his machine 6 hours and 27 minutes.  His residual AHI is 1.9 on 6 to 16 cm of water with EPR 3.  His leak in the 95th percentile is 31.3 L/min.  The patient states that sometimes he does not go to bed till 2 or 4 AM and then will be back up at 6 AM.  When the patient sleeps during the day he is not using his CPAP.  The wife reports that sometimes when he wakes up he will have hallucinations but then these resolved.  He is currently in physical therapy after his stroke.  He did have a fall last night and suffered an abrasion on his left knee and right elbow.  The wife states that he may have bumped his head but it was nothing significant.  She denies any changes in his mental status.  He is not on any blood thinners.  Patient returns today for evaluation.   REVIEW OF SYSTEMS: Out of a complete 14 system review of symptoms, the patient complains only of the following symptoms, and all other reviewed systems are negative.   Chills, fatigue, hearing loss, trouble swallowing, shortness of breath, choking, leg swelling, murmur, apnea, daytime sleepiness, sleep talking, acting out dreams, walking difficulty, wound, urgency, incontinence of bladder, excessive thirst, cold intolerance, bruise/bleed easily, anemia, memory loss, hallucinations  ALLERGIES: Allergies  Allergen Reactions   Lipitor [Atorvastatin] Other (See  Comments)    Stiff joints   Nsaids Other (See Comments)    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   Warfarin And Related Other (See Comments)    Stiff joints   Enbrel [Etanercept] Itching    HOME MEDICATIONS: Outpatient Medications Prior to Visit  Medication Sig Dispense Refill   acetaminophen (TYLENOL) 325 MG tablet Take 1-2 tablets (325-650 mg total) by mouth every 4 (four) hours as needed for mild pain. (Patient taking differently: Take 325-650 mg by mouth as needed for mild pain. )     allopurinol (ZYLOPRIM) 300 MG tablet Take 300 mg by mouth daily.      aspirin 81 MG EC tablet Take 1 tablet (81 mg total) by mouth daily.  0   donepezil (ARICEPT) 5 MG tablet Take 1 tablet (5 mg total) by mouth at bedtime. 30 tablet 3   ferrous sulfate 325 (65 FE) MG tablet Take 1 tablet (325 mg total) by mouth daily with breakfast. 30 tablet 1   gabapentin (NEURONTIN) 300 MG capsule Take 1 capsule (300 mg total) by mouth 3 (three) times daily. 90 capsule 1   metoprolol tartrate (LOPRESSOR) 50 MG tablet Take 1 tablet (50 mg total) by mouth 2 (two) times daily. 60 tablet 1   MYRBETRIQ 50 MG TB24 tablet TAKE 1 TABLET BY MOUTH EVERY DAY (Patient taking differently: Take 50 mg by mouth daily. ) 30 tablet 3  pantoprazole (PROTONIX) 40 MG tablet Take 40 mg by mouth daily.     potassium chloride SA (K-DUR) 20 MEQ tablet Take 1 tablet (20 mEq total) by mouth daily. 30 tablet 1   rosuvastatin (CRESTOR) 40 MG tablet Take 1 tablet (40 mg total) by mouth daily. (Patient taking differently: Take 40 mg by mouth every evening. ) 90 tablet 2   sertraline (ZOLOFT) 100 MG tablet Take 200 mg by mouth daily.      tamsulosin (FLOMAX) 0.4 MG CAPS capsule Take 0.4 mg by mouth at bedtime.      torsemide (DEMADEX) 20 MG tablet Take 1 tablet (20 mg total) by mouth 2 (two) times daily. 180 tablet 3   hydrocerin (EUCERIN) CREA Apply 1 application topically 2 (two) times daily. (Patient not taking: Reported on 08/07/2019)  0     neomycin-bacitracin-polymyxin (NEOSPORIN) ointment Apply 1 application topically daily as needed for wound care.      Facility-Administered Medications Prior to Visit  Medication Dose Route Frequency Provider Last Rate Last Dose   mupirocin ointment (BACTROBAN) 2 %   Topical BID Magnant, Gerrianne Scale, PA-C        PAST MEDICAL HISTORY: Past Medical History:  Diagnosis Date   (HFpEF) heart failure with preserved ejection fraction ([place])    a. 05/2013 Echo: EF 55%, mild LVH, diast dysfxn, Ao sclerosis, mildly dil LA, RV dysfxn (poorly visualized), PASP 73mmHg;  b. 03/2017 Echo: EF 55-60%, no rwma, triv MR, mildly dil RV, mod TR, PASP 44mmHg.   Atrial fibrillation ([place])    s/p Cox Maze 1/09;  Multaq Rx d/c'd in 2014 due to pulmo fibrosis;  coumadin d/c'd in 2014 due to spontaneous subdural hematoma   BPH (benign prostatic hyperplasia)    CAD (coronary artery disease), native coronary artery    a. s/p CABG 12/2007;  b. Myoview 12/2011: EF 66%, no scar or ischemia; normal.   DM (diabetes mellitus) ([place])    Hyperlipidemia type II    Hypertension    [name] (monoclonal gammopathy of unknown significance) 07/31/2018   IgA   OSA (obstructive sleep apnea)    Pacemaker    PPM - St. Jude   Peripheral neuropathy 07/31/2018   Pulmonary fibrosis ([place])    Multaq d/c'd 7/14   Subdural hematoma ([place]) 07/2012   spontaneous;  coumadin d/c'd => no longer a candidate for anticoagulation    PAST SURGICAL HISTORY: Past Surgical History:  Procedure Laterality Date   AMPUTATION Left 05/17/2019   Procedure: AMPUTATION LEFT FOURTH TOE;  Surgeon: Meredith Pel, MD;  Location: [facility];  Service: Orthopedics;  Laterality: Left;   APPENDECTOMY     CHOLECYSTECTOMY     CORONARY ARTERY BYPASS GRAFT     x3 (left internal mammary artery to distal left anterior descending coronary artery, saphenous vain graft to second circumflex marginal branch, saphenous vain graft to posterior descending coronary  artery, endoscopic saphenous vain harvest from right thigh) and modified Cox - Maze IV procedure.  [name],MD. Electronically signed CHO/MEDQ D: 01/09/2008/ JOB: [id number] cc:  [name]   CRANIOTOMY  07/30/2012   Procedure: CRANIOTOMY HEMATOMA EVACUATION SUBDURAL;  Surgeon: [name], MD;  Location: [facility];  Service: Neurosurgery;  Laterality: Right;  Right craniotomy for evacuation of subdural hematoma   FOOT SURGERY     HERNIA REPAIR     ORCHIECTOMY     Left  /  testicular cancer   PACEMAKER PLACEMENT     PPM - St.  Jude   PPM GENERATOR CHANGEOUT N/A 06/25/2019   Procedure: PPM GENERATOR CHANGEOUT;  Surgeon: Evans Lance, MD;  Location: [facility];  Service: Cardiovascular;  Laterality: N/A;    FAMILY HISTORY: Family History  Problem Relation Age of Onset   Heart disease Father    Heart attack Father    Heart failure Father    Heart disease Mother    Alzheimer's disease Mother    Dementia Mother     SOCIAL HISTORY: Social History   Socioeconomic History   Marital status: Married    Spouse name: Immunologist   Number of children: 2   Years of education: Not on file   Highest education level: Not on file  Occupational History   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: Not on file   Food insecurity    Worry: Not on file    Inability: Not on file   Transportation needs    Medical: Not on file    Non-medical: Not on file  Tobacco Use   Smoking status: Former Smoker    Quit date: 02/21/1991    Years since quitting: 28.4   Smokeless tobacco: Never Used  Substance and Sexual Activity   Alcohol use: No    Alcohol/week: 0.0 standard drinks   Drug use: No   Sexual activity: Not Currently  Lifestyle   Physical activity    Days per week: Not on file    Minutes per session: Not on file   Stress: Not on file  Relationships   Social connections    Talks on phone: Not on file    Gets  together: Not on file    Attends religious service: Not on file    Active member of club or organization: Not on file    Attends meetings of clubs or organizations: Not on file    Relationship status: Not on file   Intimate partner violence    Fear of current or ex partner: Not on file    Emotionally abused: Not on file    Physically abused: Not on file    Forced sexual activity: Not on file  Other Topics Concern   Not on file  Social History Narrative   Lives with wife   Right handed    Married with two children.     He is a Engineer, structural.        PHYSICAL EXAM  Vitals:   08/14/19 0932  BP: 94/60  Pulse: (!) 59  Temp: 97.7 F (36.5 C)  TempSrc: Oral  Weight: 186 lb 12.8 oz (84.7 kg)  Height: 5\' 8"  (1.727 m)   Body mass index is 28.4 kg/m.   MMSE - Mini Mental State Exam 08/14/2019 06/19/2019 11/26/2018  Not completed: (No Data) - -  Orientation to time 2 5 5   Orientation to Place 4 3 4   Registration 3 3 3   Attention/ Calculation 5 5 5   Attention/Calculation-comments - couldnt do numbers -  Recall 2 2 2   Language- name 2 objects 2 2 2   Language- repeat 1 1 1   Language- follow 3 step command 3 3 3   Language- read & follow direction 1 1 1   Write a sentence 1 1 1   Copy design 1 1 1   Total score 25 27 28      Generalized: Well developed, in no acute distress   Neurological examination  Mentation: Alert oriented to time, place, history taking. Follows all commands speech and language  fluent Cranial nerve II-XII: . Extraocular movements were full, visual field were full on confrontational test. . Head turning and shoulder shrug  were normal and symmetric. Motor: The motor testing reveals 5 over 5 strength of all 4 extremities. Good symmetric motor tone is noted throughout.  Sensory: Sensory testing is intact to soft touch on all 4 extremities. No evidence of extinction is noted.  Coordination: Cerebellar testing reveals good finger-nose-finger and heel-to-shin  bilaterally.  Gait and station: Patient uses a Rollator when ambulating.  Tandem gait not attempted. Reflexes: Deep tendon reflexes are symmetric and normal bilaterally.   DIAGNOSTIC DATA (LABS, IMAGING, TESTING) - I reviewed patient records, labs, notes, testing and imaging myself where available.  Lab Results  Component Value Date   WBC 4.0 06/27/2019   HGB 10.4 (L) 06/27/2019   HCT 32.3 (L) 06/27/2019   MCV 90.7 06/27/2019   PLT 75 (L) 06/27/2019      Component Value Date/Time   NA 145 (H) 07/03/2019 1541   NA 141 02/23/2017 1237   K 4.6 07/03/2019 1541   K 3.8 02/23/2017 1237   CL 104 07/03/2019 1541   CO2 27 07/03/2019 1541   CO2 27 02/23/2017 1237   GLUCOSE 102 (H) 07/03/2019 1541   GLUCOSE 103 (H) 06/27/2019 1000   GLUCOSE 113 02/23/2017 1237   BUN 44 (H) 07/03/2019 1541   BUN 18.3 02/23/2017 1237   CREATININE 1.93 (H) 07/03/2019 1541   CREATININE 1.2 02/23/2017 1237   CALCIUM 9.3 07/03/2019 1541   CALCIUM 9.6 02/23/2017 1237   PROT 7.7 05/27/2019 0517   PROT 7.3 06/18/2018 1205   PROT 8.0 02/23/2017 1237   ALBUMIN 3.5 05/27/2019 0517   ALBUMIN 4.1 09/24/2017 0824   ALBUMIN 4.1 02/23/2017 1237   AST 38 05/27/2019 0517   AST 24 02/23/2017 1237   ALT 23 05/27/2019 0517   ALT 15 02/23/2017 1237   ALKPHOS 80 05/27/2019 0517   ALKPHOS 105 02/23/2017 1237   BILITOT 0.9 05/27/2019 0517   BILITOT 0.6 09/24/2017 0824   BILITOT 0.98 02/23/2017 1237   GFRNONAA 33 (L) 07/03/2019 1541   GFRAA 38 (L) 07/03/2019 1541   Lab Results  Component Value Date   CHOL 103 05/22/2019   HDL 40 (L) 05/22/2019   LDLCALC 51 05/22/2019   LDLDIRECT 154.2 10/20/2013   TRIG 58 05/22/2019   CHOLHDL 2.6 05/22/2019   Lab Results  Component Value Date   HGBA1C 5.6 05/21/2019   Lab Results  Component Value Date   VITAMINB12 451 12/16/2018   Lab Results  Component Value Date   TSH 2.022 05/16/2019      ASSESSMENT AND PLAN 78 y.o. year old male  has a past medical history  of (HFpEF) heart failure with preserved ejection fraction (HCC), Atrial fibrillation ([place]), BPH (benign prostatic hyperplasia), CAD (coronary artery disease), native coronary artery, DM (diabetes mellitus) ([place]), Hyperlipidemia type II, Hypertension, [name] (monoclonal gammopathy of unknown significance) (07/31/2018), OSA (obstructive sleep apnea), Pacemaker, Peripheral neuropathy (07/31/2018), Pulmonary fibrosis ([place]), and Subdural hematoma ([place]) (07/2012). here with:  1.  Obstructive sleep apnea on CPAP 2.  Memory disturbance 3.  History of stroke  The patient CPAP download shows suboptimal compliance.  The patient is encouraged to continue using the CPAP nightly and greater than 4 hours.  I have encouraged the patient to adopt a better sleep routine.  Most likely he is sleepy during the day because he does not sleep at night.  I have encouraged to begin to try  to go to sleep at the same time every night and then limit naps throughout the day.  I also encouraged the patient that when he does nap in the daytime he should use his CPAP machine.  I will also order a mask refitting for the CPAP.  His memory score has remained stable.  He has done well on Aricept.  I will increase to 10 mg daily.  The patient did have a fall last night.  There is been no change in his mental status and his exam is relatively unremarkable today.  I have advised the wife that if she begins to notice any confusion or change in his mentation he should be taken to the emergency room.  She voiced understanding.  He will follow-up in 6 months or sooner if needed.      Ward Givens, MSN, NP-C 08/14/2019, 10:22 AM [facility] [address] [place], [place] [address] [phone]

## 2019-08-14 NOTE — Progress Notes (Signed)
I have read the note, and I agree with the clinical assessment and plan.  Jaylyne Breese K Koty [place]   

## 2019-08-14 NOTE — Patient Instructions (Signed)
Your Plan:  Continue using CPAP nightly. Use during the day with naps Order sent for mask refitting Work on sleep hygiene Increase Aricept to 10 mg  If your symptoms worsen or you develop new symptoms please let us know.       Thank you for coming to see [place] at [facility]. I hope we have been able to provide you high quality care today.  You may receive a patient satisfaction survey over the next few weeks. We would appreciate your feedback and comments so that we may continue to improve ourselves and the health of our patients.

## 2019-08-15 ENCOUNTER — Other Ambulatory Visit (HOSPITAL_COMMUNITY): Payer: Self-pay | Admitting: *Deleted

## 2019-08-15 DIAGNOSIS — R1310 Dysphagia, unspecified: Secondary | ICD-10-CM

## 2019-08-15 NOTE — Progress Notes (Signed)
HPI: FU CAD and atrial fibrillation. He underwent CABG (LIMA-LAD, SVG-OM 2, SVG-PDA) along with modified Cox Maze IV procedure in 12/2007. 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 Abdominal US 3/12: No aneurysm. 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 He underwent craniotomy and evacuation by Dr. Saintclair Halsted. He was taken off of Coumadin and no longer felt to be an anticoagulation candidate.Multaq DCed previously as felt causing pulmonary toxixity. Nuclear study 5/18 showed EF 59 with normal perfusion.Echocardiogram May 2020 showed ejection fraction 50 to 55%, moderate right ventricular enlargement, mildly reduced RV function, severe tricuspid regurgitation.  ABIs June 2020 normal.  Carotid Dopplers June 2020 showed 1 to 39% bilateral stenosis.  Transcranial Dopplers June 2020 negative.  Chest CT August 2020 showed mild to moderate pulmonary fibrosis. Since last seen,patient occasionally has some dyspnea on exertion.  He has had mild increasing pedal edema.  No chest pain.  He falls frequently but denies syncope.  Current Outpatient Medications  Medication Sig Dispense Refill  . acetaminophen (TYLENOL) 325 MG tablet Take 1-2 tablets (325-650 mg total) by mouth every 4 (four) hours as needed for mild pain. (Patient taking differently: Take 325-650 mg by mouth as needed for mild pain. )    . allopurinol (ZYLOPRIM) 300 MG tablet Take 300 mg by mouth daily.     [name] donepezil (ARICEPT) 10 MG tablet Take 1 tablet (10 mg total) by mouth at bedtime. 30 tablet 11  . ferrous sulfate 325 (65 FE) MG tablet Take 1 tablet (325 mg total) by mouth daily with breakfast. 30 tablet 1  . gabapentin (NEURONTIN) 300 MG capsule Take 1 capsule (300 mg total) by mouth 3 (three) times daily. 90 capsule 1  . metoprolol tartrate (LOPRESSOR) 50 MG tablet Take 1 tablet (50 mg total) by mouth 2 (two) times daily. 60 tablet 1  . MYRBETRIQ 50 MG TB24 tablet  TAKE 1 TABLET BY MOUTH EVERY DAY (Patient taking differently: Take 50 mg by mouth daily. ) 30 tablet 3  . neomycin-bacitracin-polymyxin (NEOSPORIN) ointment Apply 1 application topically daily as needed for wound care.     . pantoprazole (PROTONIX) 40 MG tablet Take 40 mg by mouth daily.    . potassium chloride SA (K-DUR) 20 MEQ tablet Take 1 tablet (20 mEq total) by mouth daily. 30 tablet 1  . rosuvastatin (CRESTOR) 40 MG tablet Take 1 tablet (40 mg total) by mouth daily. (Patient taking differently: Take 40 mg by mouth every evening. ) 90 tablet 2  . sertraline (ZOLOFT) 100 MG tablet Take 200 mg by mouth daily.     . tamsulosin (FLOMAX) 0.4 MG CAPS capsule Take 0.4 mg by mouth at bedtime.     . torsemide (DEMADEX) 20 MG tablet Take 1 tablet (20 mg total) by mouth 2 (two) times daily. 180 tablet 3  . aspirin 81 MG EC tablet Take 1 tablet (81 mg total) by mouth daily. (Patient not taking: Reported on 08/18/2019)  0   Current Facility-Administered Medications  Medication Dose Route Frequency Provider Last Rate Last Dose  . mupirocin ointment (BACTROBAN) 2 %   Topical BID Magnant, Gerrianne Scale, PA-C         Past Medical History:  Diagnosis Date  . (HFpEF) heart failure with preserved ejection fraction ([place])    a. 05/2013 Echo: EF 55%, mild LVH, diast dysfxn, Ao sclerosis, mildly dil LA, RV dysfxn (poorly visualized), PASP 40mmHg;  b. 03/2017 Echo: EF 55-60%, no rwma, triv  MR, mildly dil RV, mod TR, PASP 92mmHg.  . Atrial fibrillation [facility])    s/p Cox Maze 1/09;  Multaq Rx d/c'd in 2014 due to pulmo fibrosis;  coumadin d/c'd in 2014 due to spontaneous subdural hematoma  . BPH (benign prostatic hyperplasia)   . CAD (coronary artery disease), native coronary artery    a. s/p CABG 12/2007;  b. Myoview 12/2011: EF 66%, no scar or ischemia; normal.  . DM (diabetes mellitus) ([place])   . Hyperlipidemia type II   . Hypertension   . 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 OSA  (obstructive sleep apnea)   . Pacemaker    PPM - St. Jude  . Peripheral neuropathy 07/31/2018  . Pulmonary fibrosis ([place])    Multaq d/c'd 7/14  . Subdural hematoma ([place]) 07/2012   spontaneous;  coumadin d/c'd => no longer a candidate for anticoagulation    Past Surgical History:  Procedure Laterality Date  . AMPUTATION Left 05/17/2019   Procedure: AMPUTATION LEFT FOURTH TOE;  Surgeon: Meredith Pel, MD;  Location: [place];  Service: Orthopedics;  Laterality: Left;  . APPENDECTOMY    . CHOLECYSTECTOMY    . CORONARY ARTERY BYPASS GRAFT     x3 (left internal mammary artery to distal left anterior descending coronary artery, saphenous vain graft to second circumflex marginal branch, saphenous vain graft to posterior descending coronary artery, endoscopic saphenous vain harvest from right thigh) and modified Cox - Maze IV procedure.  [name],MD. Electronically signed CHO/MEDQ D: 01/09/2008/ JOB: [id number] cc:  [name]  . [name]  07/30/2012   Procedure: CRANIOTOMY HEMATOMA EVACUATION SUBDURAL;  Surgeon: [name], MD;  Location: [place] [facility];  Service: Neurosurgery;  Laterality: Right;  Right craniotomy for evacuation of subdural hematoma  . FOOT SURGERY    . HERNIA REPAIR    . ORCHIECTOMY     Left  /  testicular cancer  . PACEMAKER PLACEMENT     PPM - St. Jude  . PPM GENERATOR CHANGEOUT N/A 06/25/2019   Procedure: PPM GENERATOR CHANGEOUT;  Surgeon: Evans Lance, MD;  Location: [place] CV LAB;  Service: Cardiovascular;  Laterality: N/A;    Social History   Socioeconomic History  . Marital status: Married    Spouse name: [name]  . Number of children: 2  . Years of education: Not on file  . Highest education level: Not on file  Occupational History  . Occupation: Retired- IT trainer  Social Needs  . Financial resource strain: Not on file  . Food insecurity    Worry: Not on file    Inability: Not on file  . Transportation needs    Medical: Not on  file    Non-medical: Not on file  Tobacco Use  . Smoking status: Former Smoker    Packs/day: 2.00    Years: 20.00    Pack years: 40.00    Types: Cigarettes    Quit date: 02/21/1991    Years since quitting: 28.5  . Smokeless tobacco: Never Used  Substance and Sexual Activity  . Alcohol use: No    Alcohol/week: 0.0 standard drinks  . Drug use: No  . Sexual activity: Not Currently  Lifestyle  . Physical activity    Days per week: Not on file    Minutes per session: Not on file  . Stress: Not on file  Relationships  . Social connections    Talks on phone: Not on file  Gets together: Not on file    Attends religious service: Not on file    Active member of club or organization: Not on file    Attends meetings of clubs or organizations: Not on file    Relationship status: Not on file  . Intimate partner violence    Fear of current or ex partner: Not on file    Emotionally abused: Not on file    Physically abused: Not on file    Forced sexual activity: Not on file  Other Topics Concern  . Not on file  Social History Narrative   Lives with wife   Right handed    Married with two children.     He is a Engineer, structural.      Family History  Problem Relation Age of Onset  . Heart disease Father   . Heart attack Father   . Heart failure Father   . Heart disease Mother   . Alzheimer's disease Mother   . Dementia Mother     ROS: Some weakness, decreased memory, hallucinations but no fevers or chills, productive cough, hemoptysis, dysphasia, odynophagia, melena, hematochezia, dysuria, hematuria, rash, seizure activity, orthopnea, PND, claudication. Remaining systems are negative.  Physical Exam: Well-developed well-nourished in no acute distress.  Skin is warm and dry.  HEENT is normal.  Neck is supple.  Chest is clear to auscultation with normal expansion.  Cardiovascular exam is regular rate and rhythm.  2/6 systolic murmur left sternal border. Abdominal exam nontender  or distended. No masses palpated. Extremities show 1+ edema. neuro grossly intac  A/P  1 chronic right-sided heart failure-this is felt to be multifactorial including pulmonary venous hypertension, sleep apnea and pulmonary fibrosis.  He appears to be becoming volume overloaded again.  Increase Demadex to 40 mg in the morning and 20 mg in the evening.  Continue fluid restriction and low-sodium diet.  Check potassium and renal function in 1 week.  2 paroxysmal atrial fibrillation-patient remains in sinus rhythm.  Continue beta-blocker.  No anticoagulation given history of spontaneous subdural hematoma.  3 severe tricuspid regurgitation-this is felt to be secondary to prior pacemaker versus right ventricular enlargement related to pulmonary hypertension.  Patient would not be a good surgical candidate.  4 prior pacemaker-followed by Dr. Lovena Le.  5 coronary artery disease-continue statin.  He is not on aspirin given history of spontaneous intracranial hemorrhage.  6 hyperlipidemia-continue statin.  7 cirrhosis-followed by primary care.  8 anemia/thrombocytopenia-chronic and felt secondary to cirrhosis/splenomegaly as well as anemia of chronic disease and renal insufficiency.  Followed by hematology.  9 pulmonary fibrosis-follow-up pulmonary.  10 hypertension-patient's blood pressure is controlled today.  Continue present medications.  Kirk Ruths, MD

## 2019-08-18 ENCOUNTER — Encounter: Payer: Self-pay | Admitting: Cardiology

## 2019-08-18 ENCOUNTER — Ambulatory Visit (INDEPENDENT_AMBULATORY_CARE_PROVIDER_SITE_OTHER): Payer: Medicare Other | Admitting: Pulmonary Disease

## 2019-08-18 ENCOUNTER — Other Ambulatory Visit: Payer: Self-pay

## 2019-08-18 ENCOUNTER — Ambulatory Visit (INDEPENDENT_AMBULATORY_CARE_PROVIDER_SITE_OTHER): Payer: Medicare Other | Admitting: Cardiology

## 2019-08-18 ENCOUNTER — Encounter: Payer: Self-pay | Admitting: Pulmonary Disease

## 2019-08-18 VITALS — BP 118/62 | HR 63 | Temp 97.3°F | Ht 68.0 in | Wt 187.6 lb

## 2019-08-18 VITALS — BP 124/80 | HR 60 | Temp 97.3°F | Ht 68.5 in | Wt 187.0 lb

## 2019-08-18 DIAGNOSIS — I2510 Atherosclerotic heart disease of native coronary artery without angina pectoris: Secondary | ICD-10-CM | POA: Diagnosis not present

## 2019-08-18 DIAGNOSIS — J84112 Idiopathic pulmonary fibrosis: Secondary | ICD-10-CM

## 2019-08-18 DIAGNOSIS — I5033 Acute on chronic diastolic (congestive) heart failure: Secondary | ICD-10-CM | POA: Diagnosis not present

## 2019-08-18 DIAGNOSIS — I071 Rheumatic tricuspid insufficiency: Secondary | ICD-10-CM | POA: Diagnosis not present

## 2019-08-18 DIAGNOSIS — Z23 Encounter for immunization: Secondary | ICD-10-CM | POA: Diagnosis not present

## 2019-08-18 DIAGNOSIS — I5032 Chronic diastolic (congestive) heart failure: Secondary | ICD-10-CM | POA: Diagnosis not present

## 2019-08-18 DIAGNOSIS — I6330 Cerebral infarction due to thrombosis of unspecified cerebral artery: Secondary | ICD-10-CM

## 2019-08-18 MED ORDER — TORSEMIDE 20 MG PO TABS: ORAL_TABLET | ORAL | 3 refills | Status: DC

## 2019-08-18 NOTE — Patient Instructions (Signed)
Pulmonary fibrosis on CT scan  We will schedule your breathing study for just prior to your next visit in 3 months (try to do it on the same day) Provide you with a flu shot today  Continue to stay active as tolerated  Call with significant concerns

## 2019-08-18 NOTE — Patient Instructions (Signed)
Medication Instructions:  INCREASE TORSEMIDE TO 40 MG IN THE MORNING AND 20 MG IN THE AFTERNOON= 2 TABLETS IN THE MORNING AND 1 TABLET IN THE AFTERNOON  If you need a refill on your cardiac medications before your next appointment, please call your pharmacy.   Lab work: Your physician recommends that you return for lab work in: [place]   If you have labs (blood work) drawn today and your tests are completely normal, you will receive your results only by: [name] MyChart Message (if you have MyChart) OR . A paper copy in the mail If you have any lab test that is abnormal or we need to change your treatment, we will call you to review the results.  Follow-Up: At [facility], you and your health needs are our priority.  As part of our continuing mission to provide you with exceptional heart care, we have created designated Provider Care Teams.  These Care Teams include your primary Cardiologist (physician) and Advanced Practice Providers (APPs -  Physician Assistants and Nurse Practitioners) who all work together to provide you with the care you need, when you need it. Your physician recommends that you schedule a follow-up appointment in: 2-4 [place] physician recommends that you schedule a follow-up appointment in: 3-4 MONTHS WITH DR [name]

## 2019-08-18 NOTE — Progress Notes (Signed)
Subjective:    Patient ID: [name]., male    DOB: [birth year], 78 y.o.   MRN: [id number]  Patient being seen for concern for pulmonary fibrosis Admits to a cough, shortness of breath with activity  Breathing has been relatively stable Had a recent fall  He is more limited by his neuropathy  Chronic cough, relatively stable  Did smoke in the past, quit about 28 years ago, less than a pack a day Other significant comorbidities  Denies any history of chronic skin rash No history of arthritis No history of inflammatory bowel disease No history of the vasculitis known to him  No chest pains or chest discomfort  He does have a history of swallowing difficulty Possibly undiagnosed reflux  Past Medical History:  Diagnosis Date  . (HFpEF) heart failure with preserved ejection fraction ([place])    a. 05/2013 Echo: EF 55%, mild LVH, diast dysfxn, Ao sclerosis, mildly dil LA, RV dysfxn (poorly visualized), PASP 56mmHg;  b. 03/2017 Echo: EF 55-60%, no rwma, triv MR, mildly dil RV, mod TR, PASP 38mmHg.  . Atrial fibrillation [facility])    s/p Cox Maze 1/09;  Multaq Rx d/c'd in 2014 due to pulmo fibrosis;  coumadin d/c'd in 2014 due to spontaneous subdural hematoma  . BPH (benign prostatic hyperplasia)   . CAD (coronary artery disease), native coronary artery    a. s/p CABG 12/2007;  b. Myoview 12/2011: EF 66%, no scar or ischemia; normal.  . DM (diabetes mellitus) ([place])   . Hyperlipidemia type II   . Hypertension   . 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 OSA (obstructive sleep apnea)   . Pacemaker    PPM - St. Jude  . Peripheral neuropathy 07/31/2018  . Pulmonary fibrosis ([place])    Multaq d/c'd 7/14  . Subdural hematoma ([place]) 07/2012   spontaneous;  coumadin d/c'd => no longer a candidate for anticoagulation   Family History  Problem Relation Age of Onset  . Heart disease Father   . Heart attack Father   . Heart failure Father   . Heart disease  Mother   . Alzheimer's disease Mother   . Dementia Mother    Reformed smoker No pertinent occupational history  Review of Systems  Constitutional: Positive for unexpected weight change. Negative for fever.  HENT: Positive for trouble swallowing. Negative for congestion, dental problem, ear pain, nosebleeds, postnasal drip, rhinorrhea, sinus pressure, sneezing and sore throat.   Eyes: Negative for redness and itching.  Respiratory: Positive for cough and shortness of breath. Negative for chest tightness and wheezing.   Cardiovascular: Positive for leg swelling. Negative for palpitations.  Gastrointestinal: Negative for nausea and vomiting.  Genitourinary: Negative for dysuria.  Musculoskeletal: Positive for joint swelling.  Allergic/Immunologic: Negative.  Negative for environmental allergies, food allergies and immunocompromised state.      Objective:   Physical Exam Constitutional:      Appearance: Normal appearance.  HENT:     Head: Normocephalic and atraumatic.  Eyes:     General:        Right eye: No discharge.        Left eye: No discharge.     Extraocular Movements: Extraocular movements intact.     Pupils: Pupils are equal, round, and reactive to light.  Neck:     Musculoskeletal: Normal range of motion and neck supple.  Cardiovascular:     Rate and Rhythm: Normal rate.     Pulses: Normal pulses.  Heart sounds: Normal heart sounds. No murmur. No friction rub.  Pulmonary:     Effort: Pulmonary effort is normal. No respiratory distress.     Breath sounds: Normal breath sounds. No stridor. No wheezing, rhonchi or rales.  Abdominal:     General: There is no distension.     Palpations: There is no mass.     Tenderness: There is no abdominal tenderness.  Musculoskeletal: Normal range of motion.        General: No swelling or tenderness.  Neurological:     Mental Status: He is alert.    Vitals:   08/18/19 1204  Temp: (!) 97.3 F (36.3 C)   EFT from 2014 shows  no obstructive disease CT scan of the chest from 2016 shows mild interstitial lung disease Echocardiogram did reveal cardiomegaly, preserved ejection fraction, diastolic dysfunction  Recent chest x-ray shows increased pulmonary markings concerning for pulmonary fibrosis . [name] Recent CT scan was reviewed showing evidence of fibrosis, bronchiectasis Assessment & Plan:    .  Shortness of breath .  Chronic cough  .  Pulmonary fibrosis  .  Pulmonary function studies pending  Plan: .  Obtain PFT  .  Continue physical activity as tolerated  .  No other changes at present  .  We will see him back in about 3 months  .  Aspiration precautions as able  Encouraged to call with any significant concerns

## 2019-08-19 ENCOUNTER — Ambulatory Visit (HOSPITAL_COMMUNITY)
Admission: RE | Admit: 2019-08-19 | Discharge: 2019-08-19 | Disposition: A | Discharge status: Home / Self Care | Payer: Medicare Other | Source: Ambulatory Visit | Attending: Physician Assistant | Admitting: Physician Assistant

## 2019-08-19 DIAGNOSIS — R1310 Dysphagia, unspecified: Secondary | ICD-10-CM | POA: Insufficient documentation

## 2019-08-19 NOTE — Progress Notes (Signed)
Modified Barium Swallow Progress Note  Patient Details  Name: [name]. MRN: [id number] Date of Birth: December 14, 1941  Today's Date: 08/19/2019  Modified Barium Swallow completed.  Full report located under Chart Review in the Imaging Section.  Brief recommendations include the following:  Clinical Impression  Pt's oropharyngeal swallow appears to be within gross functional limits. He has mild oral residue left behing with thin liquids and purees, not observed with large piece of graham cracker. He clears this well with a spontaneous swallow. Overall his pharyngeal clearance and airway protection are good, with a single episode of trace but silent aspiration that occurred as pt was trying to wash down his barium tablet. Recommend continuing with regular solids and thin liquids, but would consider taking meds whole in puree. Pt was educated on results and recommendations.    Swallow Evaluation Recommendations       SLP Diet Recommendations: Regular solids;Thin liquid   Liquid Administration via: Cup;Straw   Medication Administration: Whole meds with puree   Supervision: Patient able to self feed   Compensations: Slow rate;Small sips/bites   Postural Changes: Seated upright at 90 degrees   Oral Care Recommendations: Oral care BID        Venita Sheffield Laine Fonner 08/19/2019,1:39 PM   Pollyann Glen, M.A. [place] Acute Environmental education officer [phone] Office [phone]

## 2019-08-20 ENCOUNTER — Ambulatory Visit: Payer: Medicare Other | Attending: Physical Medicine & Rehabilitation | Admitting: Physical Therapy

## 2019-08-20 ENCOUNTER — Other Ambulatory Visit: Payer: Self-pay

## 2019-08-20 DIAGNOSIS — H01021 Squamous blepharitis right upper eyelid: Secondary | ICD-10-CM | POA: Diagnosis not present

## 2019-08-20 DIAGNOSIS — H353131 Nonexudative age-related macular degeneration, bilateral, early dry stage: Secondary | ICD-10-CM | POA: Diagnosis not present

## 2019-08-20 DIAGNOSIS — M6281 Muscle weakness (generalized): Secondary | ICD-10-CM | POA: Insufficient documentation

## 2019-08-20 DIAGNOSIS — R2681 Unsteadiness on feet: Secondary | ICD-10-CM | POA: Diagnosis not present

## 2019-08-20 DIAGNOSIS — R2689 Other abnormalities of gait and mobility: Secondary | ICD-10-CM

## 2019-08-20 DIAGNOSIS — H01024 Squamous blepharitis left upper eyelid: Secondary | ICD-10-CM | POA: Diagnosis not present

## 2019-08-20 NOTE — Therapy (Signed)
[facility] [address] [place], [place], [address] Phone: [phone]   Fax:  [phone]  Physical Therapy Treatment  Patient Details  Name: [name]. MRN: [id number] Date of Birth: 1941-11-05 Referring Provider (PT): Alysia Penna, MD   Encounter Date: 08/20/2019  PT End of Session - 08/20/19 1709    Visit Number  4    Number of Visits  17    Date for PT Re-Evaluation  10/06/19    Authorization Type  medicare and AARP supplement    PT Start Time  1620    PT Stop Time  1700    PT Time Calculation (min)  40 min    Activity Tolerance  Patient tolerated treatment well    Behavior During Therapy  [facility] for tasks assessed/performed       Past Medical History:  Diagnosis Date  . (HFpEF) heart failure with preserved ejection fraction ([place])    a. 05/2013 Echo: EF 55%, mild LVH, diast dysfxn, Ao sclerosis, mildly dil LA, RV dysfxn (poorly visualized), PASP 34mmHg;  b. 03/2017 Echo: EF 55-60%, no rwma, triv MR, mildly dil RV, mod TR, PASP 5mmHg.  . Atrial fibrillation [facility])    s/p Cox Maze 1/09;  Multaq Rx d/c'd in 2014 due to pulmo fibrosis;  coumadin d/c'd in 2014 due to spontaneous subdural hematoma  . BPH (benign prostatic hyperplasia)   . CAD (coronary artery disease), native coronary artery    a. s/p CABG 12/2007;  b. Myoview 12/2011: EF 66%, no scar or ischemia; normal.  . DM (diabetes mellitus) ([place])   . Hyperlipidemia type II   . Hypertension   . 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 OSA (obstructive sleep apnea)   . Pacemaker    PPM - St. Jude  . Peripheral neuropathy 07/31/2018  . Pulmonary fibrosis ([place])    Multaq d/c'd 7/14  . Subdural hematoma ([place]) 07/2012   spontaneous;  coumadin d/c'd => no longer a candidate for anticoagulation    Past Surgical History:  Procedure Laterality Date  . AMPUTATION Left 05/17/2019   Procedure: AMPUTATION LEFT FOURTH TOE;   Surgeon: Meredith Pel, MD;  Location: [place];  Service: Orthopedics;  Laterality: Left;  . APPENDECTOMY    . CHOLECYSTECTOMY    . CORONARY ARTERY BYPASS GRAFT     x3 (left internal mammary artery to distal left anterior descending coronary artery, saphenous vain graft to second circumflex marginal branch, saphenous vain graft to posterior descending coronary artery, endoscopic saphenous vain harvest from right thigh) and modified Cox - Maze IV procedure.  [name],MD. Electronically signed CHO/MEDQ D: 01/09/2008/ JOB: [id number] cc:  [name]  . [name]  07/30/2012   Procedure: CRANIOTOMY HEMATOMA EVACUATION SUBDURAL;  Surgeon: [name], MD;  Location: [place] [facility];  Service: Neurosurgery;  Laterality: Right;  Right craniotomy for evacuation of subdural hematoma  . FOOT SURGERY    . HERNIA REPAIR    . ORCHIECTOMY     Left  /  testicular cancer  . PACEMAKER PLACEMENT     PPM - St. Jude  . PPM GENERATOR CHANGEOUT N/A 06/25/2019   Procedure: PPM GENERATOR CHANGEOUT;  Surgeon: Evans Lance, MD;  Location: [facility];  Service: Cardiovascular;  Laterality: N/A;    There were no vitals filed for this visit.  Subjective Assessment - 08/20/19 1708    Subjective  Relays he fell on saturday while  trying to work in the yard. He has a bruse on his head and bruise on his legs but denies any significant injuries and did not follow up with MD about this. He also says he became dizzy 30 min ago prior to coming to PT when he stood up from toilet.    Pertinent History  Lewy body dementia, debility, CHF, diabetes, CABG.    Patient Stated Goals  The patient was not using the walker before the stroke.        Treatment/Interventions  Nu step 6 min UE/LE  Sit to stand from standard chair no UE support 2X10  Stairs up/down X 4 with bilat Ue support  In // bars for dynamic balance: sidestepping, march walking, retrowalking, walking with head turns with UE support when  needed but had 2 episodes where he needed max A to regain balance    PT Short Term Goals - 08/07/19 1841      PT SHORT TERM GOAL #1   Title  The patient will perform HEP with wife's assist emphasizing LE strength, balance, general mobility.    Time  4    Period  Weeks    Target Date  09/06/19      PT SHORT TERM GOAL #2   Title  The patient will improve Berg balance score from 35/56 to > or equal to 40/56 to demo dec'ing risk for falls.    Time  4    Period  Weeks    Target Date  09/06/19      PT SHORT TERM GOAL #3   Title  The patient will improve gait speed from 1.47 ft/sec to > or equal to 2.0 ft/sec with rollator RW to demo improving functional mobility and dec'd risk for falls.    Time  4    Period  Weeks    Target Date  09/06/19      PT SHORT TERM GOAL #4   Title  The patient will demo floor<>stand with UE support and supervision due to h/o frequent falls.    Time  4    Period  Weeks    Target Date  09/06/19      PT SHORT TERM GOAL #5   Title  The patient's wife will report improved ability to rise from couch surfaces in the home.    Time  4    Period  Weeks    Target Date  09/06/19        PT Long Term Goals - 08/07/19 1843      PT LONG TERM GOAL #1   Title  The patient will be able to perform HEP progression with wife's assistance.    Time  8    Period  Weeks    Target Date  10/06/19      PT LONG TERM GOAL #2   Title  The patient will perform stair negotiation with reciprocal pattern + one rail x 4 steps mod indep.    Time  8    Period  Weeks    Target Date  10/06/19      PT LONG TERM GOAL #3   Title  The patient will be able to ambulate in clinic without a device x 200 ft with supervision without loss of balance (as he forgets his device at home).    Time  8    Period  Weeks    Target Date  10/06/19      PT LONG TERM GOAL #4  Title  The patient will improve Berg from 35/56 to > or equal to 44/56 to demo dec'ing risk for falls.    Time  8    Period   Weeks    Target Date  10/06/19            Plan - 08/20/19 1710    Clinical Impression Statement  Checked his BP today after complaints of dizziness it was a little low 110/55 pre tx and 120/60 post tx. He reported dizziness earlier in the day but denied dizziness during session. Session continueing to focus on LE strength and dynamic balance. His balance is inconsistent ranging from supervision to max A for balance on 2 episodes today, one with sidestepping and one with march walking. PT will continue to progress as able.    Personal Factors and Comorbidities  Comorbidity 1;Comorbidity 3+;Comorbidity 2;Other    Comorbidities  dementia, CABG, CHF, diabetes, neuropathy  *h/o 22+ falls in past 6 months    Examination-Activity Limitations  Locomotion Level;Squat;Stairs;Transfers;Stand    Examination-Participation Restrictions  Community Activity    Stability/Clinical Decision Making  Unstable/Unpredictable    Rehab Potential  Good   with caregiver involvement.   PT Frequency  2x / week    PT Duration  8 weeks    PT Treatment/Interventions  ADLs/Self Care Home Management;Neuromuscular re-education;Patient/family education;Gait training;Stair training;Functional mobility training;Therapeutic activities;Therapeutic exercise;Balance training;DME Instruction    PT Next Visit Plan  progress his  HEP educating his wife on assisting, standing balance, safety during gait, work on functional tasks that may be meaninful for home    PT Home Exercise Plan  sit to stands    Consulted and Agree with Plan of Care  Patient       Patient will benefit from skilled therapeutic intervention in order to improve the following deficits and impairments:  Abnormal gait, Decreased activity tolerance, Decreased balance, Decreased strength, Decreased mobility, Decreased safety awareness, Decreased coordination  Visit Diagnosis: Other abnormalities of gait and mobility  Unsteadiness on feet  Muscle weakness  (generalized)     Problem List Patient Active Problem List   Diagnosis Date Noted  . Acute on chronic diastolic heart failure ([place]) 06/03/2019  . Coronary artery disease involving native heart without angina pectoris 06/03/2019  . Debility 05/26/2019  . 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 Toe infection 05/17/2019  . DOE (dyspnea on exertion) 05/15/2019  . Severe tricuspid regurgitation 05/15/2019  . Acute on chronic right-sided heart failure ([place]) 05/15/2019  . Cellulitis of fourth toe of left foot   . Excessive daytime sleepiness 02/11/2019  . 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[place]) 02/11/2019  . Iron deficiency anemia 02/04/2019  . Poor memory 12/16/2018  . Deficiency anemia 11/19/2018  . Other fatigue 11/19/2018  . History of elevated PSA 11/19/2018  . Prostate cancer screening 11/19/2018  . Prostate CA ([place]) 11/19/2018  . Pancytopenia, acquired ([place]) 08/13/2018  . Recurrent falls 08/13/2018  . 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 Peripheral neuropathy 07/31/2018  . 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 Anemia, chronic disease 01/28/2018  . History of skin cancer 07/27/2017  . Fatty liver disease, nonalcoholic Q000111Q  . Thrombocytopenia ([place]) 11/27/2016  . Lung nodule 04/23/2014  . Postinflammatory pulmonary fibrosis ([place]) 06/13/2013  . Long term (current) use of anticoagulants 11/22/2011  . Abdominal bruit 03/07/2011  . Pacemaker-St.Jude   . Hx of CABG   . Hyperlipidemia type II   . OSA (obstructive sleep apnea)   .  PACEMAKER, PERMANENT 08/17/2010  . DIABETES MELLITUS 08/16/2010  . HYPERLIPIDEMIA 08/16/2010  . Obesity 08/16/2010  . Essential hypertension 08/16/2010  . Atrial fibrillation ([place]) 08/16/2010  . IRREGULAR HEART RATE 08/16/2010  . Sleep apnea 08/16/2010  . BENIGN PROSTATIC HYPERTROPHY, HX OF 08/16/2010    Silvestre Mesi 08/20/2019, 5:13 PM  [facility] [address] [place], [place], [address] Phone: [phone]   Fax:  [phone]  Name: Thailan Lagreca. MRN: [id number] Date of Birth: 18-Mar-1941

## 2019-08-21 NOTE — Progress Notes (Signed)
cpap orders received [employer].

## 2019-08-27 ENCOUNTER — Encounter: Payer: Self-pay | Admitting: Physical Therapy

## 2019-08-27 ENCOUNTER — Other Ambulatory Visit: Payer: Self-pay

## 2019-08-27 ENCOUNTER — Ambulatory Visit: Payer: Medicare Other | Admitting: Physical Therapy

## 2019-08-27 DIAGNOSIS — M6281 Muscle weakness (generalized): Secondary | ICD-10-CM | POA: Diagnosis not present

## 2019-08-27 DIAGNOSIS — R2681 Unsteadiness on feet: Secondary | ICD-10-CM

## 2019-08-27 DIAGNOSIS — R2689 Other abnormalities of gait and mobility: Secondary | ICD-10-CM | POA: Diagnosis not present

## 2019-08-27 NOTE — Therapy (Signed)
[facility] [address] [place], [place], [address] Phone: [phone]   Fax:  [phone]  Physical Therapy Treatment  Patient Details  Name: [name]. MRN: [id number] Date of Birth: 02/06/1941 Referring Provider (PT): Alysia Penna, MD   Encounter Date: 08/27/2019  PT End of Session - 08/27/19 [id number]    Visit Number  5    Number of Visits  17    Date for PT Re-Evaluation  10/06/19    Authorization Type  medicare and AARP supplement    PT Start Time  1623   pt late   PT Stop Time  1701    PT Time Calculation (min)  38 min    Activity Tolerance  Patient tolerated treatment well    Behavior During Therapy  WFL for tasks assessed/performed       Past Medical History:  Diagnosis Date  . (HFpEF) heart failure with preserved ejection fraction ([place])    a. 05/2013 Echo: EF 55%, mild LVH, diast dysfxn, Ao sclerosis, mildly dil LA, RV dysfxn (poorly visualized), PASP 28mmHg;  b. 03/2017 Echo: EF 55-60%, no rwma, triv MR, mildly dil RV, mod TR, PASP 86mmHg.  . Atrial fibrillation [facility])    s/p Cox Maze 1/09;  Multaq Rx d/c'd in 2014 due to pulmo fibrosis;  coumadin d/c'd in 2014 due to spontaneous subdural hematoma  . BPH (benign prostatic hyperplasia)   . CAD (coronary artery disease), native coronary artery    a. s/p CABG 12/2007;  b. Myoview 12/2011: EF 66%, no scar or ischemia; normal.  . DM (diabetes mellitus) ([place])   . Hyperlipidemia type II   . Hypertension   . 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 OSA (obstructive sleep apnea)   . Pacemaker    PPM - St. Jude  . Peripheral neuropathy 07/31/2018  . Pulmonary fibrosis ([place])    Multaq d/c'd 7/14  . Subdural hematoma ([place]) 07/2012   spontaneous;  coumadin d/c'd => no longer a candidate for anticoagulation    Past Surgical History:  Procedure Laterality Date  . AMPUTATION Left 05/17/2019   Procedure: AMPUTATION LEFT FOURTH  TOE;  Surgeon: Meredith Pel, MD;  Location: [facility];  Service: Orthopedics;  Laterality: Left;  . APPENDECTOMY    . CHOLECYSTECTOMY    . CORONARY ARTERY BYPASS GRAFT     x3 (left internal mammary artery to distal left anterior descending coronary artery, saphenous vain graft to second circumflex marginal branch, saphenous vain graft to posterior descending coronary artery, endoscopic saphenous vain harvest from right thigh) and modified Cox - Maze IV procedure.  [name],MD. Electronically signed CHO/MEDQ D: 01/09/2008/ JOB: [id number] cc:  [name]  . [name]  07/30/2012   Procedure: CRANIOTOMY HEMATOMA EVACUATION SUBDURAL;  Surgeon: [name], MD;  Location: [facility];  Service: Neurosurgery;  Laterality: Right;  Right craniotomy for evacuation of subdural hematoma  . FOOT SURGERY    . HERNIA REPAIR    . ORCHIECTOMY     Left  /  testicular cancer  . PACEMAKER PLACEMENT     PPM - St. Jude  . PPM GENERATOR CHANGEOUT N/A 06/25/2019   Procedure: PPM GENERATOR CHANGEOUT;  Surgeon: Evans Lance, MD;  Location: [facility];  Service: Cardiovascular;  Laterality: N/A;    There were no vitals filed for this visit.  Subjective Assessment - 08/27/19 1747    Subjective  Relays he wants  to do things at home but his wife is getting upset if he does yardwork because she does not want him to fall.    Pertinent History  Lewy body dementia, debility, CHF, diabetes, CABG.    Patient Stated Goals  The patient was not using the walker before the stroke.    Currently in Pain?  No/denies       Treatment/Interventions  Nu step X 7 min UE/LE for endurance and leg strength  Dynamic balance in bars: march walking, retro walking, sidestepping, and tandem walking with UE support as needed, then went outside for simulated golf chipping swings hitting balls (supervision)  then walking on uneven grass to retrieve them (CGA) and picking them up (needed max A and almost fell so PT  picked up remaining balls for him)    PT Short Term Goals - 08/07/19 1841      PT SHORT TERM GOAL #1   Title  The patient will perform HEP with wife's assist emphasizing LE strength, balance, general mobility.    Time  4    Period  Weeks    Target Date  09/06/19      PT SHORT TERM GOAL #2   Title  The patient will improve Berg balance score from 35/56 to > or equal to 40/56 to demo dec'ing risk for falls.    Time  4    Period  Weeks    Target Date  09/06/19      PT SHORT TERM GOAL #3   Title  The patient will improve gait speed from 1.47 ft/sec to > or equal to 2.0 ft/sec with rollator RW to demo improving functional mobility and dec'd risk for falls.    Time  4    Period  Weeks    Target Date  09/06/19      PT SHORT TERM GOAL #4   Title  The patient will demo floor<>stand with UE support and supervision due to h/o frequent falls.    Time  4    Period  Weeks    Target Date  09/06/19      PT SHORT TERM GOAL #5   Title  The patient's wife will report improved ability to rise from couch surfaces in the home.    Time  4    Period  Weeks    Target Date  09/06/19        PT Long Term Goals - 08/07/19 1843      PT LONG TERM GOAL #1   Title  The patient will be able to perform HEP progression with wife's assistance.    Time  8    Period  Weeks    Target Date  10/06/19      PT LONG TERM GOAL #2   Title  The patient will perform stair negotiation with reciprocal pattern + one rail x 4 steps mod indep.    Time  8    Period  Weeks    Target Date  10/06/19      PT LONG TERM GOAL #3   Title  The patient will be able to ambulate in clinic without a device x 200 ft with supervision without loss of balance (as he forgets his device at home).    Time  8    Period  Weeks    Target Date  10/06/19      PT LONG TERM GOAL #4   Title  The patient will improve Berg from 35/56 to >  or equal to 44/56 to demo dec'ing risk for falls.    Time  8    Period  Weeks    Target Date   10/06/19            Plan - 08/27/19 1749    Clinical Impression Statement  Progressed with balance training, he did better in the bars but still relys on them frequently for balance. Went outside to Teacher, English as a foreign language and balance with golf swing and balance with walking in the grass on uneven terrain. He had good balance with golf swing with overall close supervision, he needed CGA to min A with walking on uneven terrain and he needed max A for balance when he tried to pick up golf ball he became off balance but PT able to grab him and keep from falling.    Personal Factors and Comorbidities  Comorbidity 1;Comorbidity 3+;Comorbidity 2;Other    Comorbidities  dementia, CABG, CHF, diabetes, neuropathy  *h/o 22+ falls in past 6 months    Examination-Activity Limitations  Locomotion Level;Squat;Stairs;Transfers;Stand    Examination-Participation Restrictions  Community Activity    Stability/Clinical Decision Making  Unstable/Unpredictable    Rehab Potential  Good   with caregiver involvement.   PT Frequency  2x / week    PT Duration  8 weeks    PT Treatment/Interventions  ADLs/Self Care Home Management;Neuromuscular re-education;Patient/family education;Gait training;Stair training;Functional mobility training;Therapeutic activities;Therapeutic exercise;Balance training;DME Instruction    PT Next Visit Plan  progress his  HEP educating his wife on assisting, standing balance, safety during gait, work on functional tasks that may be meaninful for home    PT Home Exercise Plan  sit to stands    Consulted and Agree with Plan of Care  Patient       Patient will benefit from skilled therapeutic intervention in order to improve the following deficits and impairments:  Abnormal gait, Decreased activity tolerance, Decreased balance, Decreased strength, Decreased mobility, Decreased safety awareness, Decreased coordination  Visit Diagnosis: Other abnormalities of gait and  mobility  Unsteadiness on feet  Muscle weakness (generalized)     Problem List Patient Active Problem List   Diagnosis Date Noted  . Acute on chronic diastolic heart failure ([place]) 06/03/2019  . Coronary artery disease involving native heart without angina pectoris 06/03/2019  . Debility 05/26/2019  . 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 Toe infection 05/17/2019  . DOE (dyspnea on exertion) 05/15/2019  . Severe tricuspid regurgitation 05/15/2019  . Acute on chronic right-sided heart failure ([place]) 05/15/2019  . Cellulitis of fourth toe of left foot   . Excessive daytime sleepiness 02/11/2019  . 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[place]) 02/11/2019  . Iron deficiency anemia 02/04/2019  . Poor memory 12/16/2018  . Deficiency anemia 11/19/2018  . Other fatigue 11/19/2018  . History of elevated PSA 11/19/2018  . Prostate cancer screening 11/19/2018  . Prostate CA ([place]) 11/19/2018  . Pancytopenia, acquired ([place]) 08/13/2018  . Recurrent falls 08/13/2018  . 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 Peripheral neuropathy 07/31/2018  . 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 Anemia, chronic disease 01/28/2018  . History of skin cancer 07/27/2017  . Fatty liver disease, nonalcoholic Q000111Q  . Thrombocytopenia ([place]) 11/27/2016  . Lung nodule 04/23/2014  . Postinflammatory pulmonary fibrosis ([place]) 06/13/2013  . Long term (current) use of anticoagulants 11/22/2011  . Abdominal bruit 03/07/2011  . Pacemaker-St.Jude   . Hx of CABG   . Hyperlipidemia type II   .  OSA (obstructive sleep apnea)   . PACEMAKER, PERMANENT 08/17/2010  . DIABETES MELLITUS 08/16/2010  . HYPERLIPIDEMIA 08/16/2010  . Obesity 08/16/2010  . Essential hypertension 08/16/2010  . Atrial fibrillation ([place]) 08/16/2010  . IRREGULAR HEART RATE 08/16/2010  . Sleep apnea 08/16/2010  . BENIGN PROSTATIC HYPERTROPHY, HX OF 08/16/2010    Silvestre Mesi 08/27/2019, 5:54 PM  [facility] [address] [place], [place], [address] Phone: [phone]   Fax:  [phone]  Name: Tayari Ramos. MRN: [id number] Date of Birth: Jan 24, 1941

## 2019-08-29 ENCOUNTER — Other Ambulatory Visit: Payer: Self-pay

## 2019-08-29 ENCOUNTER — Encounter: Payer: Medicare Other | Attending: Registered Nurse | Admitting: Physical Medicine & Rehabilitation

## 2019-08-29 ENCOUNTER — Encounter: Payer: Self-pay | Admitting: Physical Medicine & Rehabilitation

## 2019-08-29 VITALS — BP 109/65 | HR 94 | Temp 97.5°F | Ht 68.0 in | Wt 186.2 lb

## 2019-08-29 DIAGNOSIS — I50813 Acute on chronic right heart failure: Secondary | ICD-10-CM | POA: Insufficient documentation

## 2019-08-29 DIAGNOSIS — I6330 Cerebral infarction due to thrombosis of unspecified cerebral artery: Secondary | ICD-10-CM | POA: Diagnosis not present

## 2019-08-29 DIAGNOSIS — F0281 Dementia in other diseases classified elsewhere with behavioral disturbance: Secondary | ICD-10-CM | POA: Diagnosis not present

## 2019-08-29 DIAGNOSIS — R5381 Other malaise: Secondary | ICD-10-CM | POA: Diagnosis not present

## 2019-08-29 DIAGNOSIS — I5033 Acute on chronic diastolic (congestive) heart failure: Secondary | ICD-10-CM | POA: Diagnosis not present

## 2019-08-29 DIAGNOSIS — I69398 Other sequelae of cerebral infarction: Secondary | ICD-10-CM | POA: Diagnosis not present

## 2019-08-29 DIAGNOSIS — G3183 Dementia with Lewy bodies: Secondary | ICD-10-CM | POA: Insufficient documentation

## 2019-08-29 DIAGNOSIS — R269 Unspecified abnormalities of gait and mobility: Secondary | ICD-10-CM | POA: Diagnosis not present

## 2019-08-29 NOTE — Patient Instructions (Signed)
Please do balance exercises everyday  Always use your Valene Bors!

## 2019-08-29 NOTE — Progress Notes (Signed)
Subjective:    Patient ID: [name]., male    DOB: Aug 18, 1941, 78 y.o.   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  He was transition from home health therapy to outpatient therapy.  He has been going to outpatient PT to work on his balance. Has had several more falls since last visit.  He has been seen by the cardiologist for his acute on chronic diastolic heart failure.  No history of syncope.  Patient feels like his falls are mainly just loss of balance.  He sometimes cannot feel where his feet are. Upon further questioning he states that his falls have been when he does not use his rolling walker.  He has a Rollator that he is supposed to be using at all times. Pain Inventory Average Pain 4 Pain Right Now 3 My pain is burning  In the last 24 hours, has pain interfered with the following? General activity 4 Relation with others 2 Enjoyment of life 9 What TIME of day is your pain at its worst? all Sleep (in general) Fair  Pain is worse with: bending and standing Pain improves with: rest Relief from Meds: na  Mobility walk with assistance use a walker ability to climb steps?  yes do you drive?  no  Function retired  Neuro/Psych bladder control problems trouble walking confusion  Prior Studies CT/MRI  Physicians involved in your care Neurosurgeon .   Family History  Problem Relation Age of Onset  . Heart disease Father   . Heart attack Father   . Heart failure Father   . Heart disease Mother   . Alzheimer's disease Mother   . Dementia Mother    Social History   Socioeconomic History  . Marital status: Married    Spouse name: [name]  . Number of children: 2  . Years of education: Not on file  . Highest education level: Not on file  Occupational History  . Occupation: Retired- IT trainer  Social Needs  . Financial  resource strain: Not on file  . Food insecurity    Worry: Not on file    Inability: Not on file  . Transportation needs    Medical: Not on file    Non-medical: Not on file  Tobacco Use  . Smoking status: Former Smoker    Packs/day: 2.00    Years: 20.00    Pack years: 40.00    Types: Cigarettes    Quit date: 02/21/1991    Years since quitting: 28.5  . Smokeless tobacco: Never Used  Substance and Sexual Activity  . Alcohol use: No    Alcohol/week: 0.0 standard drinks  . Drug use: No  . Sexual activity: Not Currently  Lifestyle  . Physical activity    Days per week: Not on file    Minutes per session: Not on file  . Stress: Not on file  Relationships  . Social Herbalist on phone: Not on file    Gets together: Not on file    Attends religious service: Not on file    Active member of club or organization: Not on file    Attends meetings of clubs or organizations: Not on file    Relationship status: Not on file  Other Topics Concern  . Not on file  Social History Narrative   Lives with wife  Right handed    Married with two children.     He is a Engineer, structural.     Past Surgical History:  Procedure Laterality Date  . AMPUTATION Left 05/17/2019   Procedure: AMPUTATION LEFT FOURTH TOE;  Surgeon: Meredith Pel, MD;  Location: [facility];  Service: Orthopedics;  Laterality: Left;  . APPENDECTOMY    . CHOLECYSTECTOMY    . CORONARY ARTERY BYPASS GRAFT     x3 (left internal mammary artery to distal left anterior descending coronary artery, saphenous vain graft to second circumflex marginal branch, saphenous vain graft to posterior descending coronary artery, endoscopic saphenous vain harvest from right thigh) and modified Cox - Maze IV procedure.  [name],MD. Electronically signed CHO/MEDQ D: 01/09/2008/ JOB: [id number] cc:  [name]  . [name]  07/30/2012   Procedure: CRANIOTOMY HEMATOMA EVACUATION SUBDURAL;  Surgeon: [name], MD;  Location: [facility];  Service: Neurosurgery;  Laterality: Right;  Right craniotomy for evacuation of subdural hematoma  . FOOT SURGERY    . HERNIA REPAIR    . ORCHIECTOMY     Left  /  testicular cancer  . PACEMAKER PLACEMENT     PPM - St. Jude  . PPM GENERATOR CHANGEOUT N/A 06/25/2019   Procedure: PPM GENERATOR CHANGEOUT;  Surgeon: Evans Lance, MD;  Location: [facility];  Service: Cardiovascular;  Laterality: N/A;   Past Medical History:  Diagnosis Date  . (HFpEF) heart failure with preserved ejection fraction ([place])    a. 05/2013 Echo: EF 55%, mild LVH, diast dysfxn, Ao sclerosis, mildly dil LA, RV dysfxn (poorly visualized), PASP 20mmHg;  b. 03/2017 Echo: EF 55-60%, no rwma, triv MR, mildly dil RV, mod TR, PASP 43mmHg.  . Atrial fibrillation [facility])    s/p Cox Maze 1/09;  Multaq Rx d/c'd in 2014 due to pulmo fibrosis;  coumadin d/c'd in 2014 due to spontaneous subdural hematoma  . BPH (benign prostatic hyperplasia)   . CAD (coronary artery disease), native coronary artery    a. s/p CABG 12/2007;  b. Myoview 12/2011: EF 66%, no scar or ischemia; normal.  . DM (diabetes mellitus) ([place])   . Hyperlipidemia type II   . Hypertension   . 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 OSA (obstructive sleep apnea)   . Pacemaker    PPM - St. Jude  . Peripheral neuropathy 07/31/2018  . Pulmonary fibrosis ([place])    Multaq d/c'd 7/14  . Subdural hematoma ([place]) 07/2012   spontaneous;  coumadin d/c'd => no longer a candidate for anticoagulation   BP 109/65   Pulse 94   Temp (!) 97.5 F (36.4 C)   Ht 5\' 8"  (1.727 m)   Wt 186 lb 3.2 oz (84.5 kg)   SpO2 94%   BMI 28.31 kg/m   Opioid Risk Score:   Fall Risk Score:  `1  Depression screen PHQ 2/9  Depression screen PHQ 2/9 06/17/2019  Decreased Interest 0  Down, Depressed, Hopeless 0  PHQ - 2 Score 0  Altered sleeping 0  Tired, decreased energy 0  Change in appetite 0  Feeling bad or failure about yourself  0   Trouble concentrating 0  Moving slowly or fidgety/restless 0  Suicidal thoughts 0  PHQ-9 Score 0  Some recent data might be hidden    Review of Systems  Constitutional: Negative.   HENT: Negative.   Eyes: Negative.   Respiratory: Negative.   Cardiovascular: Negative.  Gastrointestinal: Negative.   Endocrine: Negative.   Genitourinary: Positive for frequency.  Musculoskeletal: Positive for gait problem.  Skin: Negative.   Allergic/Immunologic: Negative.   Hematological: Bruises/bleeds easily.  Psychiatric/Behavioral: Positive for confusion.  All other systems reviewed and are negative.      Objective:   Physical Exam Vitals signs and nursing note reviewed.  Constitutional:      Appearance: Normal appearance.  HENT:     Head: Normocephalic and atraumatic.  Eyes:     Extraocular Movements: Extraocular movements intact.     Conjunctiva/sclera: Conjunctivae normal.     Pupils: Pupils are equal, round, and reactive to light.  Neurological:     Mental Status: He is alert and oriented to person, place, and time.  Psychiatric:        Mood and Affect: Mood normal.    Patient with decreased sensation to light touch in the toes of both feet.  He has decreased proprioception at the great toe.  He has intact light touch sensation at the ankles. Motor strength is 5/5 bilateral deltoid bicep tricep grip hip flexor knee extensor ankle dorsiflexor. Gait is using a Rollator he has no evidence of toe drag or knee instability. Romberg is positive falls toward the left       Assessment & Plan:  #1.  Patient with balance disorder appears to be multifactorial with decreased sensation in feet.  He is not diabetic but may have other causes of neuropathy.  This has been a chronic issue and predated his hospitalization earlier this spring.  He is not compliant with the use of a Rollator.  We once again emphasized the need to use this at all times. We also discussed the need to continue with  his strengthening exercises for his lower extremity including a home exercise program every day. At this point I do not think he needs regular PMNR follow-up.  I will see him on a as needed basis.  Patient will follow-up with PCP as well as with cardiology, neurology and orthopedics

## 2019-09-01 ENCOUNTER — Other Ambulatory Visit: Payer: Self-pay

## 2019-09-01 ENCOUNTER — Ambulatory Visit: Payer: Medicare Other | Admitting: Physical Therapy

## 2019-09-01 DIAGNOSIS — R2681 Unsteadiness on feet: Secondary | ICD-10-CM

## 2019-09-01 DIAGNOSIS — R2689 Other abnormalities of gait and mobility: Secondary | ICD-10-CM

## 2019-09-01 DIAGNOSIS — M6281 Muscle weakness (generalized): Secondary | ICD-10-CM

## 2019-09-01 NOTE — Therapy (Signed)
[facility] [address] [place], [place], [address] Phone: [phone]   Fax:  [phone]  Physical Therapy Treatment  Patient Details  Name: [name]. MRN: [id number] Date of Birth: February 25, 1941 Referring Provider (PT): Alysia Penna, MD   Encounter Date: 09/01/2019  PT End of Session - 09/01/19 2133    Visit Number  6    Number of Visits  17    Date for PT Re-Evaluation  10/06/19    Authorization Type  medicare and AARP supplement    PT Start Time  1625    PT Stop Time  1703    PT Time Calculation (min)  38 min    Activity Tolerance  Patient tolerated treatment well    Behavior During Therapy  [facility] for tasks assessed/performed       Past Medical History:  Diagnosis Date  . (HFpEF) heart failure with preserved ejection fraction ([place])    a. 05/2013 Echo: EF 55%, mild LVH, diast dysfxn, Ao sclerosis, mildly dil LA, RV dysfxn (poorly visualized), PASP 82mmHg;  b. 03/2017 Echo: EF 55-60%, no rwma, triv MR, mildly dil RV, mod TR, PASP 53mmHg.  . Atrial fibrillation [facility])    s/p Cox Maze 1/09;  Multaq Rx d/c'd in 2014 due to pulmo fibrosis;  coumadin d/c'd in 2014 due to spontaneous subdural hematoma  . BPH (benign prostatic hyperplasia)   . CAD (coronary artery disease), native coronary artery    a. s/p CABG 12/2007;  b. Myoview 12/2011: EF 66%, no scar or ischemia; normal.  . DM (diabetes mellitus) ([place])   . Hyperlipidemia type II   . Hypertension   . 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 OSA (obstructive sleep apnea)   . Pacemaker    PPM - St. Jude  . Peripheral neuropathy 07/31/2018  . Pulmonary fibrosis ([place])    Multaq d/c'd 7/14  . Subdural hematoma ([place]) 07/2012   spontaneous;  coumadin d/c'd => no longer a candidate for anticoagulation    Past Surgical History:  Procedure Laterality Date  . AMPUTATION Left 05/17/2019   Procedure: AMPUTATION LEFT FOURTH TOE;   Surgeon: Meredith Pel, MD;  Location: [facility];  Service: Orthopedics;  Laterality: Left;  . APPENDECTOMY    . CHOLECYSTECTOMY    . CORONARY ARTERY BYPASS GRAFT     x3 (left internal mammary artery to distal left anterior descending coronary artery, saphenous vain graft to second circumflex marginal branch, saphenous vain graft to posterior descending coronary artery, endoscopic saphenous vain harvest from right thigh) and modified Cox - Maze IV procedure.  [name],MD. Electronically signed CHO/MEDQ D: 01/09/2008/ JOB: [id number] cc:  [name]  . [name]  07/30/2012   Procedure: CRANIOTOMY HEMATOMA EVACUATION SUBDURAL;  Surgeon: [name], MD;  Location: [facility];  Service: Neurosurgery;  Laterality: Right;  Right craniotomy for evacuation of subdural hematoma  . FOOT SURGERY    . HERNIA REPAIR    . ORCHIECTOMY     Left  /  testicular cancer  . PACEMAKER PLACEMENT     PPM - St. Jude  . PPM GENERATOR CHANGEOUT N/A 06/25/2019   Procedure: PPM GENERATOR CHANGEOUT;  Surgeon: Evans Lance, MD;  Location: [facility];  Service: Cardiovascular;  Laterality: N/A;    There were no vitals filed for this visit.  Subjective Assessment - 09/01/19 2131    Subjective  Relays he keeps falling outside but  he does not have his rollator with him when asked. He was told he needs to have his rollator with him at all times.    Pertinent History  Lewy body dementia, debility, CHF, diabetes, CABG.    Currently in Pain?  No/denies       Treatment/Interventions  Nu step X 7 min UE/LE for endurance and leg strength  Dynamic balance in bars: march walking, retro walking, sidestepping, and tandem walkin, walking on mat to simulate uneven surface with UE support as needed,  then went outside for simulated golf chipping swings hitting balls 6 balls X 3 with (supervision) then walking on uneven grass  With rollator to retrieve them (CGA) and picking them up (CGA to Min A for  balance while he had one hand on rollator)    PT Short Term Goals - 08/07/19 1841      PT SHORT TERM GOAL #1   Title  The patient will perform HEP with wife's assist emphasizing LE strength, balance, general mobility.    Time  4    Period  Weeks    Target Date  09/06/19      PT SHORT TERM GOAL #2   Title  The patient will improve Berg balance score from 35/56 to > or equal to 40/56 to demo dec'ing risk for falls.    Time  4    Period  Weeks    Target Date  09/06/19      PT SHORT TERM GOAL #3   Title  The patient will improve gait speed from 1.47 ft/sec to > or equal to 2.0 ft/sec with rollator RW to demo improving functional mobility and dec'd risk for falls.    Time  4    Period  Weeks    Target Date  09/06/19      PT SHORT TERM GOAL #4   Title  The patient will demo floor<>stand with UE support and supervision due to h/o frequent falls.    Time  4    Period  Weeks    Target Date  09/06/19      PT SHORT TERM GOAL #5   Title  The patient's wife will report improved ability to rise from couch surfaces in the home.    Time  4    Period  Weeks    Target Date  09/06/19        PT Long Term Goals - 08/07/19 1843      PT LONG TERM GOAL #1   Title  The patient will be able to perform HEP progression with wife's assistance.    Time  8    Period  Weeks    Target Date  10/06/19      PT LONG TERM GOAL #2   Title  The patient will perform stair negotiation with reciprocal pattern + one rail x 4 steps mod indep.    Time  8    Period  Weeks    Target Date  10/06/19      PT LONG TERM GOAL #3   Title  The patient will be able to ambulate in clinic without a device x 200 ft with supervision without loss of balance (as he forgets his device at home).    Time  8    Period  Weeks    Target Date  10/06/19      PT LONG TERM GOAL #4   Title  The patient will improve Berg from 35/56 to > or equal  to 44/56 to demo dec'ing risk for falls.    Time  8    Period  Weeks    Target Date   10/06/19            Plan - 09/01/19 2134    Clinical Impression Statement  Pt was more unsteady today with balance training. Continued simulated golf swinging today hitting reduced flight golf balls but this time had him walk with his rollator and retrieve them in order to remind him to have his rollator with him at all times. He will continue to need balance and gait training.    Personal Factors and Comorbidities  Comorbidity 1;Comorbidity 3+;Comorbidity 2;Other    Comorbidities  dementia, CABG, CHF, diabetes, neuropathy  *h/o 22+ falls in past 6 months    Examination-Activity Limitations  Locomotion Level;Squat;Stairs;Transfers;Stand    Examination-Participation Restrictions  Community Activity    Stability/Clinical Decision Making  Unstable/Unpredictable    Rehab Potential  Good   with caregiver involvement.   PT Frequency  2x / week    PT Duration  8 weeks    PT Treatment/Interventions  ADLs/Self Care Home Management;Neuromuscular re-education;Patient/family education;Gait training;Stair training;Functional mobility training;Therapeutic activities;Therapeutic exercise;Balance training;DME Instruction    PT Next Visit Plan  check STG, progress his  HEP educating his wife on assisting, standing balance, safety during gait, work on functional tasks that may be meaninful for home    PT Home Exercise Plan  sit to stands    Consulted and Agree with Plan of Care  Patient       Patient will benefit from skilled therapeutic intervention in order to improve the following deficits and impairments:  Abnormal gait, Decreased activity tolerance, Decreased balance, Decreased strength, Decreased mobility, Decreased safety awareness, Decreased coordination  Visit Diagnosis: Other abnormalities of gait and mobility  Unsteadiness on feet  Muscle weakness (generalized)     Problem List Patient Active Problem List   Diagnosis Date Noted  . Acute on chronic diastolic heart failure ([place])  06/03/2019  . Coronary artery disease involving native heart without angina pectoris 06/03/2019  . Debility 05/26/2019  . 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 Toe infection 05/17/2019  . DOE (dyspnea on exertion) 05/15/2019  . Severe tricuspid regurgitation 05/15/2019  . Acute on chronic right-sided heart failure ([place]) 05/15/2019  . Cellulitis of fourth toe of left foot   . Excessive daytime sleepiness 02/11/2019  . 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[place]) 02/11/2019  . Iron deficiency anemia 02/04/2019  . Poor memory 12/16/2018  . Deficiency anemia 11/19/2018  . Other fatigue 11/19/2018  . History of elevated PSA 11/19/2018  . Prostate cancer screening 11/19/2018  . Prostate CA ([place]) 11/19/2018  . Pancytopenia, acquired ([place]) 08/13/2018  . Recurrent falls 08/13/2018  . 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 Peripheral neuropathy 07/31/2018  . 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 Anemia, chronic disease 01/28/2018  . History of skin cancer 07/27/2017  . Fatty liver disease, nonalcoholic [id number]  . Thrombocytopenia ([place]) 11/27/2016  . Lung nodule 04/23/2014  . Postinflammatory pulmonary fibrosis ([place]) 06/13/2013  . Long term (current) use of anticoagulants 11/22/2011  . Abdominal bruit 03/07/2011  . Pacemaker-St.Jude   . Hx of CABG   . Hyperlipidemia type II   . OSA (obstructive sleep apnea)   . PACEMAKER, PERMANENT 08/17/2010  . DIABETES MELLITUS 08/16/2010  . HYPERLIPIDEMIA 08/16/2010  . Obesity 08/16/2010  . Essential hypertension 08/16/2010  . Atrial fibrillation ([place]) 08/16/2010  .  IRREGULAR HEART RATE 08/16/2010  . Sleep apnea 08/16/2010  . BENIGN PROSTATIC HYPERTROPHY, HX OF 08/16/2010    Silvestre Mesi 09/01/2019, 9:38 PM  [facility] [address] [place], [place], [address] Phone: [phone]   Fax:   [phone]  Name: Shraga Kowal. MRN: [id number] Date of Birth: 1941/11/25

## 2019-09-04 ENCOUNTER — Other Ambulatory Visit: Payer: Self-pay

## 2019-09-04 ENCOUNTER — Ambulatory Visit: Payer: Medicare Other | Admitting: Rehabilitative and Restorative Service Providers"

## 2019-09-04 ENCOUNTER — Encounter: Payer: Self-pay | Admitting: Rehabilitative and Restorative Service Providers"

## 2019-09-04 DIAGNOSIS — R2681 Unsteadiness on feet: Secondary | ICD-10-CM

## 2019-09-04 DIAGNOSIS — R2689 Other abnormalities of gait and mobility: Secondary | ICD-10-CM | POA: Diagnosis not present

## 2019-09-04 DIAGNOSIS — M6281 Muscle weakness (generalized): Secondary | ICD-10-CM | POA: Diagnosis not present

## 2019-09-04 NOTE — Therapy (Signed)
[facility] [address] [place], [place], [address] Phone: [phone]   Fax:  [phone]  Physical Therapy Treatment  Patient Details  Name: [name]. MRN: [id number] Date of Birth: 18-Dec-1941 Referring Provider (PT): Alysia Penna, MD   Encounter Date: 09/04/2019  PT End of Session - 09/04/19 1611    Visit Number  7    Number of Visits  17    Date for PT Re-Evaluation  10/06/19    Authorization Type  medicare and AARP supplement    PT Start Time  1538    PT Stop Time  1622    PT Time Calculation (min)  44 min    Activity Tolerance  Patient tolerated treatment well    Behavior During Therapy  WFL for tasks assessed/performed       Past Medical History:  Diagnosis Date  . (HFpEF) heart failure with preserved ejection fraction ([place])    a. 05/2013 Echo: EF 55%, mild LVH, diast dysfxn, Ao sclerosis, mildly dil LA, RV dysfxn (poorly visualized), PASP 50mHg;  b. 03/2017 Echo: EF 55-60%, no rwma, triv MR, mildly dil RV, mod TR, PASP 460mg.  . [facility]   s/p Cox Maze 1/09;  Multaq Rx d/c'd in 2014 due to pulmo fibrosis;  coumadin d/c'd in 2014 due to spontaneous subdural hematoma  . BPH (benign prostatic hyperplasia)   . CAD (coronary artery disease), native coronary artery    a. s/p CABG 12/2007;  b. Myoview 12/2011: EF 66%, no scar or ischemia; normal.  . DM (diabetes mellitus) ([facility]  . Hyperlipidemia type II   . Hypertension   . 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 OSA (obstructive sleep apnea)   . Pacemaker    PPM - St. Jude  . Peripheral neuropathy 07/31/2018  . Pulmonary fibrosis ([place]   Multaq d/c'd 7/14  . Subdural hematoma ([facility]08/2013   spontaneous;  coumadin d/c'd => no longer a candidate for anticoagulation    Past Surgical History:  Procedure Laterality Date  . AMPUTATION Left 05/17/2019   Procedure: AMPUTATION LEFT FOURTH TOE;   Surgeon: DeMeredith PelMD;  Location: [facility]: Orthopedics;  Laterality: Left;  . APPENDECTOMY    . CHOLECYSTECTOMY    . CORONARY ARTERY BYPASS GRAFT     x3 (left internal mammary artery to distal left anterior descending coronary artery, saphenous vain graft to second circumflex marginal branch, saphenous vain graft to posterior descending coronary artery, endoscopic saphenous vain harvest from right thigh) and modified Cox - Maze IV procedure.  ClValentina GuOwen,MD. Electronically signed CHO/MEDQ D: 01/09/2008/ JOB: [id number]:  JoIran SizerD  . CRKyla Balzarine8/13/2013   Procedure: CRANIOTOMY HEMATOMA EVACUATION SUBDURAL;  Surgeon: GaElaina HoopsMD;  [facility];  Service: Neurosurgery;  Laterality: Right;  Right craniotomy for evacuation of subdural hematoma  . FOOT SURGERY    . HERNIA REPAIR    . ORCHIECTOMY     Left  /  testicular cancer  . PACEMAKER PLACEMENT     PPM - St. Jude  . PPM GENERATOR CHANGEOUT N/A 06/25/2019   Procedure: PPM GENERATOR CHANGEOUT;  Surgeon: TaEvans LanceMD;  Location: [facility];  Service: Cardiovascular;  Laterality: N/A;    There were no vitals filed for this visit.  Subjective Assessment - 09/04/19 1540    Subjective  The patient reports he is not  doing HEP as often as prescribed.  He is using the rollator RW in the house.  He is falling occasionally and feels like his legs are "giving out".  He reports they are not tired, they just do not want to go.    Pertinent History  Lewy body dementia, debility, CHF, diabetes, CABG.    Patient Stated Goals  The patient was not using the walker before the stroke.    Currently in Pain?  No/denies         [facility] PT Assessment - 09/04/19 1542      Berg Balance Test   Sit to Stand  Able to stand without using hands and stabilize independently    Standing Unsupported  Able to stand safely 2 minutes    Sitting with Back Unsupported but Feet Supported on Floor or Stool  Able to sit  safely and securely 2 minutes    Stand to Sit  Sits safely with minimal use of hands    Transfers  Able to transfer safely, minor use of hands    Standing Unsupported with Eyes Closed  Able to stand 10 seconds with supervision    Standing Unsupported with Feet Together  Able to place feet together independently and stand for 1 minute with supervision    From Standing, Reach Forward with Outstretched Arm  Can reach forward >12 cm safely (5")    From Standing Position, Pick up Object from [place] to pick up shoe, needs supervision    From Standing Position, Turn to Look Behind Over each Shoulder  Looks behind from both sides and weight shifts well    Turn 360 Degrees  Needs close supervision or verbal cueing    Standing Unsupported, Alternately Place Feet on Step/Stool  Able to complete >2 steps/needs minimal assist    Standing Unsupported, One Foot in Front  Able to take small step independently and hold 30 seconds    Standing on One Leg  Tries to lift leg/unable to hold 3 seconds but remains standing independently    Total Score  41    Berg comment:  41/56                   [facility] Adult PT Treatment/Exercise - 09/04/19 1600      Transfers   Transfers  Sit to Stand;Stand to Sit    Sit to Stand  6: Modified independent (Device/Increase time)    Stand to Sit  6: Modified independent (Device/Increase time)      Ambulation/Gait   Ambulation/Gait  Yes    Ambulation/Gait Assistance  5: Supervision    Ambulation/Gait Assistance Details  The patient had one loss of balance when R foot caught the floor, but was able to self recover with use of rollator    Ambulation Distance (Feet)  450 Feet    Assistive device  4-wheeled walker    Gait Pattern  Step-through pattern    Gait velocity  1.86 ft/sec      Therapeutic Activites    Therapeutic Activities  Other Therapeutic Activities    Other Therapeutic Activities  floor<>stand transfers with UE support with supervision after PT  demonstrates.  PT walked patient to parking lot and observed him loading rollator RW into the trunk and holding onto the top of the car walking to passenger side seat.  Patient unsteady once he has put rollator RW into car.        Neuro Re-ed    Neuro Re-ed Details  Standing wegiht shift ant/posterior and stepping ant<>return to midline for stepping strategies.  sidestepping with UE support initially and then without UE support with CGA.        Exercises   Exercises  Other Exercises    Other Exercises   sit<>stand x 10 reps without UE support, standing heel raises initially with UE support through countertop and then without UE support, toe raises x 10 reps with UE support.               PT Short Term Goals - 09/04/19 1547      PT SHORT TERM GOAL #1   Title  The patient will perform HEP with wife's assist emphasizing LE strength, balance, general mobility.    Time  4    Period  Weeks    Target Date  09/06/19      PT SHORT TERM GOAL #2   Title  The patient will improve Berg balance score from 35/56 to > or equal to 40/56 to demo dec'ing risk for falls.    Baseline  41/56    Time  4    Period  Weeks    Status  Achieved    Target Date  09/06/19      PT SHORT TERM GOAL #3   Title  The patient will improve gait speed from 1.47 ft/sec to > or equal to 2.0 ft/sec with rollator RW to demo improving functional mobility and dec'd risk for falls.    Baseline  1.86 ft/sec with rollator RW on 09/04/2019    Time  4    Period  Weeks    Status  Partially Met    Target Date  09/06/19      PT SHORT TERM GOAL #4   Title  The patient will demo floor<>stand with UE support and supervision due to h/o frequent falls.    Baseline  PT demonstrated and the patient performed with supervision.  *Upon standing on mat, the patient needed CGA due to more compliant surface.    Time  4    Period  Weeks    Status  Achieved    Target Date  09/06/19      PT SHORT TERM GOAL #5   Title  The patient's  wife will report improved ability to rise from couch surfaces in the home.    Time  4    Period  Weeks    Target Date  09/06/19        PT Long Term Goals - 08/07/19 1843      PT LONG TERM GOAL #1   Title  The patient will be able to perform HEP progression with wife's assistance.    Time  8    Period  Weeks    Target Date  10/06/19      PT LONG TERM GOAL #2   Title  The patient will perform stair negotiation with reciprocal pattern + one rail x 4 steps mod indep.    Time  8    Period  Weeks    Target Date  10/06/19      PT LONG TERM GOAL #3   Title  The patient will be able to ambulate in clinic without a device x 200 ft with supervision without loss of balance (as he forgets his device at home).    Time  8    Period  Weeks    Target Date  10/06/19      PT LONG TERM GOAL #4  Title  The patient will improve Berg from 35/56 to > or equal to 44/56 to demo dec'ing risk for falls.    Time  8    Period  Weeks    Target Date  10/06/19            Plan - 09/04/19 1645    Clinical Impression Statement  The patient met STG for Berg balance test and floor transfers and partially met STG for gait speed.  His wife did not accompany him today into the clinic due to the rain, therefore PT did not add further HEP.    PT Treatment/Interventions  ADLs/Self Care Home Management;Neuromuscular re-education;Patient/family education;Gait training;Stair training;Functional mobility training;Therapeutic activities;Therapeutic exercise;Balance training;DME Instruction    PT Next Visit Plan  finish checking STG, progress his  HEP educating his wife on assisting (add heel and toe raises, review sit<>stand), standing balance, safety during gait, work on functional tasks that may be meaninful for home    PT Home Exercise Plan  sit to stands    Consulted and Agree with Plan of Care  Patient       Patient will benefit from skilled therapeutic intervention in order to improve the following deficits  and impairments:  Abnormal gait, Decreased activity tolerance, Decreased balance, Decreased strength, Decreased mobility, Decreased safety awareness, Decreased coordination  Visit Diagnosis: Other abnormalities of gait and mobility  Unsteadiness on feet  Muscle weakness (generalized)     Problem List Patient Active Problem List   Diagnosis Date Noted  . Acute on chronic diastolic heart failure ([place]) 06/03/2019  . Coronary artery disease involving native heart without angina pectoris 06/03/2019  . Debility 05/26/2019  . 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 Toe infection 05/17/2019  . DOE (dyspnea on exertion) 05/15/2019  . Severe tricuspid regurgitation 05/15/2019  . Acute on chronic right-sided heart failure ([place]) 05/15/2019  . Cellulitis of fourth toe of left foot   . Excessive daytime sleepiness 02/11/2019  . 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[place]) 02/11/2019  . Iron deficiency anemia 02/04/2019  . Poor memory 12/16/2018  . Deficiency anemia 11/19/2018  . Other fatigue 11/19/2018  . History of elevated PSA 11/19/2018  . Prostate cancer screening 11/19/2018  . Prostate CA ([place]) 11/19/2018  . Pancytopenia, acquired ([place]) 08/13/2018  . Recurrent falls 08/13/2018  . 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 Peripheral neuropathy 07/31/2018  . 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 Anemia, chronic disease 01/28/2018  . History of skin cancer 07/27/2017  . Fatty liver disease, nonalcoholic 95/18/8416  . Thrombocytopenia ([place]) 11/27/2016  . Lung nodule 04/23/2014  . Postinflammatory pulmonary fibrosis ([place]) 06/13/2013  . Long term (current) use of anticoagulants 11/22/2011  . Abdominal bruit 03/07/2011  . Pacemaker-St.Jude   . Hx of CABG   . Hyperlipidemia type II   . OSA (obstructive sleep apnea)   . PACEMAKER, PERMANENT 08/17/2010  . DIABETES MELLITUS 08/16/2010  . HYPERLIPIDEMIA 08/16/2010  .  Obesity 08/16/2010  . Essential hypertension 08/16/2010  . Atrial fibrillation ([place]) 08/16/2010  . IRREGULAR HEART RATE 08/16/2010  . Sleep apnea 08/16/2010  . BENIGN PROSTATIC HYPERTROPHY, HX OF 08/16/2010    Anna-Marie Coller, PT 09/04/2019, 4:51 PM  [facility] [address] [place], [place], [address] Phone: [phone]   Fax:  [phone]  Name: Lerone Onder. MRN: [id number] Date of Birth: 1941-03-31

## 2019-09-07 ENCOUNTER — Other Ambulatory Visit: Payer: Self-pay | Admitting: Physical Medicine and Rehabilitation

## 2019-09-08 ENCOUNTER — Ambulatory Visit: Payer: Medicare Other | Admitting: Physical Therapy

## 2019-09-08 ENCOUNTER — Other Ambulatory Visit: Payer: Self-pay

## 2019-09-08 DIAGNOSIS — R2689 Other abnormalities of gait and mobility: Secondary | ICD-10-CM | POA: Diagnosis not present

## 2019-09-08 DIAGNOSIS — R2681 Unsteadiness on feet: Secondary | ICD-10-CM

## 2019-09-08 DIAGNOSIS — I5032 Chronic diastolic (congestive) heart failure: Secondary | ICD-10-CM | POA: Diagnosis not present

## 2019-09-08 DIAGNOSIS — M6281 Muscle weakness (generalized): Secondary | ICD-10-CM | POA: Diagnosis not present

## 2019-09-08 NOTE — Progress Notes (Addendum)
Cardiology Office Note   Date:  09/09/2019   ID:  Jamie Guthrie., DOB 11-26-1941, MRN [id number]  PCP:  [name], MD  Cardiologist:  [facility]  CC: Frequent Falls    History of Present Illness: Jamie Imraan Stich. is a 78 y.o. male who presents for ongoing assessment and management of coronary artery disease with history of CABG (LIMA to LAD, SVG to OM 2, SVG to PDA, additionally with modified Cox maze IV procedure in January 2009.  Other history includes atrial fibrillation, pacemaker implantation for sinus node dysfunction and symptomatic bradycardia.    The patient was taken off of Coumadin in the setting of spontaneous subdural hematoma and August 2013 requiring a craniotomy and evacuation by Dr. Saintclair Halsted.  He is no longer to be considered an anticoagulation candidate.  He was intolerant to Multaq, as it was felt that it was causing pulmonary toxicity.  CT scan in August 2020 did show mild to moderate pulmonary fibrosis.  Other history includes anemia, cirrhosis of the liver, and pulmonary fibrosis followed by pulmonary.  The patient has chronic right-sided CHF felt to be multifactorial, including pulmonary venous hypertension, sleep apnea, and pulmonary fibrosis.  When last seen by Dr. [name] on 08/18/2019 he appeared volume overloaded.  Demadex was increased to 40 mg in the morning and 20 mg in the evening.  He was to continue fluid restriction and a low-sodium diet.    Follow-up labs to include a BMET were ordered.  He was found to be in normal sinus rhythm on that office visit.  He was noted not to be a surgical candidate for tricuspid valve repair in the setting of severe tricuspid regurgitation as it was felt it was secondary to prior pacemaker versus right ventricular enlargement related to pulmonary hypertension.  He has fallen 3 times in the last 5 days. He has had numerous injures to his face, has ablack eye on the left, several facial lacerations. He fell today coming into  the office while in the parking garage. Needed to be rolled in on his rolling walker.   He has been seen by neurology due to mild dementia, vivid dreams and falling asleep very easily. They have increased Aricept to 10 mg from 5 mg.  He feels sleepy and groggy throughout the day. He denies pain of dyspnea but has frequent dizziness. Her has no further edema.    Past Medical History:  Diagnosis Date  . (HFpEF) heart failure with preserved ejection fraction ([place])    a. 05/2013 Echo: EF 55%, mild LVH, diast dysfxn, Ao sclerosis, mildly dil LA, RV dysfxn (poorly visualized), PASP 20mmHg;  b. 03/2017 Echo: EF 55-60%, no rwma, triv MR, mildly dil RV, mod TR, PASP 64mmHg.  . Atrial fibrillation [facility])    s/p Cox Maze 1/09;  Multaq Rx d/c'd in 2014 due to pulmo fibrosis;  coumadin d/c'd in 2014 due to spontaneous subdural hematoma  . BPH (benign prostatic hyperplasia)   . CAD (coronary artery disease), native coronary artery    a. s/p CABG 12/2007;  b. Myoview 12/2011: EF 66%, no scar or ischemia; normal.  . DM (diabetes mellitus) ([place])   . Hyperlipidemia type II   . Hypertension   . 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 OSA (obstructive sleep apnea)   . Pacemaker    PPM - St. Jude  . Peripheral neuropathy 07/31/2018  . Pulmonary fibrosis ([place])    Multaq d/c'd 7/14  . Subdural hematoma (  [place]) 07/2012   spontaneous;  coumadin d/c'd => no longer a candidate for anticoagulation    Past Surgical History:  Procedure Laterality Date  . AMPUTATION Left 05/17/2019   Procedure: AMPUTATION LEFT FOURTH TOE;  Surgeon: Meredith Pel, MD;  Location: [facility];  Service: Orthopedics;  Laterality: Left;  . APPENDECTOMY    . CHOLECYSTECTOMY    . CORONARY ARTERY BYPASS GRAFT     x3 (left internal mammary artery to distal left anterior descending coronary artery, saphenous vain graft to second circumflex marginal branch, saphenous vain graft to posterior descending coronary  artery, endoscopic saphenous vain harvest from right thigh) and modified Cox - Maze IV procedure.  [name],MD. Electronically signed CHO/MEDQ D: 01/09/2008/ JOB: [id number] cc:  [name]  . [name]  07/30/2012   Procedure: CRANIOTOMY HEMATOMA EVACUATION SUBDURAL;  Surgeon: [name], MD;  Location: [facility]O ORS;  Service: Neurosurgery;  Laterality: Right;  Right craniotomy for evacuation of subdural hematoma  . FOOT SURGERY    . HERNIA REPAIR    . ORCHIECTOMY     Left  /  testicular cancer  . PACEMAKER PLACEMENT     PPM - St. Jude  . PPM GENERATOR CHANGEOUT N/A 06/25/2019   Procedure: PPM GENERATOR CHANGEOUT;  Surgeon: Evans Lance, MD;  Location: [facility] CV LAB;  Service: Cardiovascular;  Laterality: N/A;     Current Outpatient Medications  Medication Sig Dispense Refill  . acetaminophen (TYLENOL) 325 MG tablet Take 1-2 tablets (325-650 mg total) by mouth every 4 (four) hours as needed for mild pain. (Patient taking differently: Take 325-650 mg by mouth as needed for mild pain. )    . allopurinol (ZYLOPRIM) 300 MG tablet Take 300 mg by mouth daily.     [name] aspirin 81 MG EC tablet Take 1 tablet (81 mg total) by mouth daily. (Patient taking differently: Take 81 mg by mouth daily. ON HOLD)  0  . donepezil (ARICEPT) 10 MG tablet Take 1 tablet (10 mg total) by mouth at bedtime. 30 tablet 11  . ferrous sulfate 325 (65 FE) MG tablet Take 1 tablet (325 mg total) by mouth daily with breakfast. 30 tablet 1  . gabapentin (NEURONTIN) 300 MG capsule TAKE 1 CAPSULE BY MOUTH THREE TIMES A DAY 90 capsule 1  . metoprolol tartrate (LOPRESSOR) 50 MG tablet Take 1 tablet (50 mg total) by mouth 2 (two) times daily. 60 tablet 1  . MYRBETRIQ 50 MG TB24 tablet TAKE 1 TABLET BY MOUTH EVERY DAY (Patient taking differently: Take 50 mg by mouth daily. ) 30 tablet 3  . neomycin-bacitracin-polymyxin (NEOSPORIN) ointment Apply 1 application topically daily as needed for wound care.     .  pantoprazole (PROTONIX) 40 MG tablet Take 40 mg by mouth daily.    . potassium chloride SA (K-DUR) 20 MEQ tablet Take 1 tablet (20 mEq total) by mouth daily. 30 tablet 1  . rosuvastatin (CRESTOR) 40 MG tablet Take 1 tablet (40 mg total) by mouth daily. (Patient taking differently: Take 40 mg by mouth every evening. ) 90 tablet 2  . sertraline (ZOLOFT) 100 MG tablet Take 200 mg by mouth daily.     . tamsulosin (FLOMAX) 0.4 MG CAPS capsule Take 0.4 mg by mouth at bedtime.     . torsemide (DEMADEX) 20 MG tablet Take 1 tablet (20 mg total) by mouth 2 (two) times daily. 180 tablet 3   Current Facility-Administered Medications  Medication Dose Route Frequency Provider Last  Rate Last Dose  . mupirocin ointment (BACTROBAN) 2 %   Topical BID Magnant, Charles L, PA-C        Allergies:   Lipitor [atorvastatin], Nsaids, Warfarin and related, and Enbrel [etanercept]    Social History:  The patient  reports that he quit smoking about 28 years ago. His smoking use included cigarettes. He has a 40.00 pack-year smoking history. He has never used smokeless tobacco. He reports that he does not drink alcohol or use drugs.   Family History:  The patient's family history includes Alzheimer's disease in his mother; Dementia in his mother; Heart attack in his father; Heart disease in his father and mother; Heart failure in his father.    ROS: All other systems are reviewed and negative. Unless otherwise mentioned in H&P    PHYSICAL EXAM: VS:  BP (!) 92/52   Pulse 60   Ht 5\' 8"  (1.727 m)   Wt 180 lb 9.6 oz (81.9 kg)   BMI 27.46 kg/m  , BMI Body mass index is 27.46 kg/m.   GEN: Well nourished, well developed, in no acute distress HEENT: Periorbital edema with ecchymosis on the left. Neck: no JVD, carotid bruits, or masses Cardiac: RRR; no murmurs, rubs, or gallops,no edema  Respiratory:  Clear to auscultation bilaterally, normal work of breathing GI: soft, nontender, nondistended, + BS MS: no deformity  or atrophy Skin: warm and dry, no rash, multiple facial lacerations.  Bruises on arms and lower extremities. Neuro:  Strength and sensation are intact Psych: euthymic mood, full affect   EKG: Atrial fib with slow ventricular response, RBBB, Rate of 60 bpm/.   Recent Labs: 05/16/2019: B Natriuretic Peptide 289.1; TSH 2.022 05/26/2019: Magnesium 2.0 05/27/2019: ALT 23 06/27/2019: Hemoglobin 10.4; Platelets 75 09/08/2019: BUN 30; Creatinine, Ser 1.64; Potassium 4.3; Sodium 146    Lipid Panel    Component Value Date/Time   CHOL 103 05/22/2019 0338   CHOL 119 09/24/2017 0824   TRIG 58 05/22/2019 0338   HDL 40 (L) 05/22/2019 0338   HDL 42 09/24/2017 0824   CHOLHDL 2.6 05/22/2019 0338   VLDL 12 05/22/2019 0338   LDLCALC 51 05/22/2019 0338   LDLCALC 58 09/24/2017 0824   LDLDIRECT 154.2 10/20/2013 0846      Wt Readings from Last 3 Encounters:  09/09/19 180 lb 9.6 oz (81.9 kg)  08/29/19 186 lb 3.2 oz (84.5 kg)  08/18/19 187 lb (84.8 kg)      Other studies Reviewed: Echocardiogram 05/06/2019 1. The left ventricle has low normal systolic function, with an ejection fraction of 50-55%. The cavity size was normal. Left ventricular diastolic Doppler parameters are indeterminate.  2. LVEF is approximately 50 to 55% with septal hypokinesis. Poor.  3. The right ventricle was not well visualized. The cavity was moderately enlarged. There is no increase in right ventricular wall thickness.  4. RV is difficult to visualize, even with Definity It appears dilated and function is at least mildly reduced.  5. The mitral valve is abnormal. Mild thickening of the mitral valve leaflet.  6. The tricuspid valve is grossly normal. Tricuspid valve regurgitation is severe.  7. The aortic valve was not well visualized. Mild thickening of the aortic valve. Mild calcification of the aortic valve.  8. The inferior vena cava was dilated in size with <50% respiratory variability.  9. The interatrial septum was not  assessed.   ASSESSMENT AND PLAN:  1.Frequent Falls: No longer on Eiliquis or ASA. He has fallen 3 times in 4  days. I will decrease his torsemide back down to 20 mg BID, as his BP is hypotensive. HE will decrease the Flomax to 0.02 mg daily from 0.4 mg daily.   2. Atrial fib with slow ventricular response. I do no think that this is contributing. To is falls.He is no longer on anticoagulation.With the increased frequency of the falls, and hx of SDH, would not be candidate.   3. Chronic CHF: He will have his torsemide decreased to 20 mg twice daily from 40 mg in a.m. and 20 mg in the p.m.  There is no evidence of fluid overload.  He is to take an additional 20 mg as needed for fluid retention.  4.  Dementia: He is being followed by neurology.  His wife is frustrated as he is not making any progress along with increased doses of Aricept.  She states it is making him feel groggy.  I have recommended that he decrease his Aricept to 5 mg and to have him take it at at bedtime.  He will need to follow-up with neurology for further assessment and evaluation.   Current medicines are reviewed at length with the patient today.  I have spent greater than 45 minutes with this patient and his wife answering multiple questions, reviewing medications, with a shared decision making plan of action.  Labs/ tests ordered today include: Checking BMET and CBC.    I have also written an order for a wheelchair so that he would be able to have safe ambulation when he is seeing providers in our office or outside of his home as the rolling walker is no longer supportive of him. Mr. Mollner requires a lightweight wheelchair due to repeated falls, acute on chronic diastolic heart failure, and dementia which prevent him from using a cane, crutch or walker. Patient can safely self propel the lightweight wheelchair but not a heavier standard weight wheelchair or has a caregiver who can assist.   [name], ANP, AACC    09/09/2019 [facility] [place] [address] Office [phone] Fax [phone]

## 2019-09-08 NOTE — Therapy (Signed)
[facility] [address] [place], [place], [address] Phone: [phone]   Fax:  [phone]  Physical Therapy Treatment  Patient Details  Name: [name]. MRN: [id number] Date of Birth: 1941-08-25 Referring Provider (PT): Alysia Penna, MD   Encounter Date: 09/08/2019  PT End of Session - 09/08/19 1805    Visit Number  8    Number of Visits  17    Date for PT Re-Evaluation  10/06/19    Authorization Type  medicare and AARP supplement    PT Start Time  1620    PT Stop Time  1705    PT Time Calculation (min)  45 min    Activity Tolerance  Patient tolerated treatment well    Behavior During Therapy  [facility] for tasks assessed/performed       Past Medical History:  Diagnosis Date  . (HFpEF) heart failure with preserved ejection fraction ([place])    a. 05/2013 Echo: EF 55%, mild LVH, diast dysfxn, Ao sclerosis, mildly dil LA, RV dysfxn (poorly visualized), PASP 66mHg;  b. 03/2017 Echo: EF 55-60%, no rwma, triv MR, mildly dil RV, mod TR, PASP 415mg.  . [facility]   s/p Cox Maze 1/09;  Multaq Rx d/c'd in 2014 due to pulmo fibrosis;  coumadin d/c'd in 2014 due to spontaneous subdural hematoma  . BPH (benign prostatic hyperplasia)   . CAD (coronary artery disease), native coronary artery    a. s/p CABG 12/2007;  b. Myoview 12/2011: EF 66%, no scar or ischemia; normal.  . DM (diabetes mellitus) ([facility]  . Hyperlipidemia type II   . Hypertension   . 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 OSA (obstructive sleep apnea)   . Pacemaker    PPM - St. Jude  . Peripheral neuropathy 07/31/2018  . Pulmonary fibrosis ([facility]   Multaq d/c'd 7/14  . Subdural hematoma ([facility]08/2013   spontaneous;  coumadin d/c'd => no longer a candidate for anticoagulation    Past Surgical History:  Procedure Laterality Date  . AMPUTATION Left 05/17/2019   Procedure: AMPUTATION LEFT FOURTH TOE;   Surgeon: DeMeredith PelMD;  Location: [facility] Service: Orthopedics;  Laterality: Left;  . APPENDECTOMY    . CHOLECYSTECTOMY    . CORONARY ARTERY BYPASS GRAFT     x3 (left internal mammary artery to distal left anterior descending coronary artery, saphenous vain graft to second circumflex marginal branch, saphenous vain graft to posterior descending coronary artery, endoscopic saphenous vain harvest from right thigh) and modified Cox - Maze IV procedure.  ClValentina GuOwen,MD. Electronically signed CHO/MEDQ D: 01/09/2008/ JOB: [id number]:  JoIran SizerD  . CRKyla Balzarine8/13/2013   Procedure: CRANIOTOMY HEMATOMA EVACUATION SUBDURAL;  Surgeon: GaElaina HoopsMD;  [facility];  Service: Neurosurgery;  Laterality: Right;  Right craniotomy for evacuation of subdural hematoma  . FOOT SURGERY    . HERNIA REPAIR    . ORCHIECTOMY     Left  /  testicular cancer  . PACEMAKER PLACEMENT     PPM - St. Jude  . PPM GENERATOR CHANGEOUT N/A 06/25/2019   Procedure: PPM GENERATOR CHANGEOUT;  Surgeon: TaEvans LanceMD;  Location: [facility];  Service: Cardiovascular;  Laterality: N/A;    There were no vitals filed for this visit.  Subjective Assessment - 09/08/19 1759    Subjective  He has bruising and band aids  covering his face, and hands, he relays 2 more falls one on saturday when when he got of of bed his legs gave out, and the other was on sunday he got tripped up inside his house and reached for rollator but did not have it close enough to him. He relays 7/10 neck and shoulder pain from this but did not go to a doctor. He denies LOC, feeling dizzy, or any other symptoms.    Pertinent History  Lewy body dementia, debility, CHF, diabetes, CABG.    Patient Stated Goals  The patient was not using the walker before the stroke.                       [facility] Adult PT Treatment/Exercise - 09/08/19 0001      Exercises   Other Exercises   nu step 6 min UE/LE L5, sit to  stands no UE support 2X5, leg press 70 lbs 2X10, then balance training (tandem walk with one UE support, retro walk and sidestepping without UE support, balance on foam feet apart eyes closed 20-30 sec X 3, step ups onto airex X 10 bilat without UE support, stairs up/down X 2 with cues for reciprocal ascend but step to descend for safety.               PT Short Term Goals - 09/04/19 1547      PT SHORT TERM GOAL #1   Title  The patient will perform HEP with wife's assist emphasizing LE strength, balance, general mobility.    Time  4    Period  Weeks    Target Date  09/06/19      PT SHORT TERM GOAL #2   Title  The patient will improve Berg balance score from 35/56 to > or equal to 40/56 to demo dec'ing risk for falls.    Baseline  41/56    Time  4    Period  Weeks    Status  Achieved    Target Date  09/06/19      PT SHORT TERM GOAL #3   Title  The patient will improve gait speed from 1.47 ft/sec to > or equal to 2.0 ft/sec with rollator RW to demo improving functional mobility and dec'd risk for falls.    Baseline  1.86 ft/sec with rollator RW on 09/04/2019    Time  4    Period  Weeks    Status  Partially Met    Target Date  09/06/19      PT SHORT TERM GOAL #4   Title  The patient will demo floor<>stand with UE support and supervision due to h/o frequent falls.    Baseline  PT demonstrated and the patient performed with supervision.  *Upon standing on mat, the patient needed CGA due to more compliant surface.    Time  4    Period  Weeks    Status  Achieved    Target Date  09/06/19      PT SHORT TERM GOAL #5   Title  The patient's wife will report improved ability to rise from couch surfaces in the home.    Time  4    Period  Weeks    Target Date  09/06/19        PT Long Term Goals - 08/07/19 1843      PT LONG TERM GOAL #1   Title  The patient will be able to perform HEP progression with wife's  assistance.    Time  8    Period  Weeks    Target Date  10/06/19       PT LONG TERM GOAL #2   Title  The patient will perform stair negotiation with reciprocal pattern + one rail x 4 steps mod indep.    Time  8    Period  Weeks    Target Date  10/06/19      PT LONG TERM GOAL #3   Title  The patient will be able to ambulate in clinic without a device x 200 ft with supervision without loss of balance (as he forgets his device at home).    Time  8    Period  Weeks    Target Date  10/06/19      PT LONG TERM GOAL #4   Title  The patient will improve Berg from 35/56 to > or equal to 44/56 to demo dec'ing risk for falls.    Time  8    Period  Weeks    Target Date  10/06/19            Plan - 09/08/19 1805    Clinical Impression Statement  He had another 2 falls but did not appear to have any serious injury just bruising and scrapes on his head and arms. Session focused on dynamic balance and bilat leg strength and his wife was present this sesison who appears very supportive. He has a tendency to leave his rollotar a few steps away from him when he sits or grabs onto something else then he has to walk a few steps to get back to it unsupported and he was cautioned against this in order to prevent any further falling at home. PT will continue to try to reinforce this.    PT Treatment/Interventions  ADLs/Self Care Home Management;Neuromuscular re-education;Patient/family education;Gait training;Stair training;Functional mobility training;Therapeutic activities;Therapeutic exercise;Balance training;DME Instruction    PT Next Visit Plan  update HEP, finish checking STG, progress his  HEP educating his wife on assisting (add heel and toe raises, review sit<>stand), standing balance, safety during gait, work on functional tasks that may be meaninful for home    PT Home Exercise Plan  sit to stands    Consulted and Agree with Plan of Care  Patient       Patient will benefit from skilled therapeutic intervention in order to improve the following deficits and  impairments:  Abnormal gait, Decreased activity tolerance, Decreased balance, Decreased strength, Decreased mobility, Decreased safety awareness, Decreased coordination  Visit Diagnosis: Other abnormalities of gait and mobility  Unsteadiness on feet  Muscle weakness (generalized)     Problem List Patient Active Problem List   Diagnosis Date Noted  . Acute on chronic diastolic heart failure ([name]) 06/03/2019  . Coronary artery disease involving native heart without angina pectoris 06/03/2019  . Debility 05/26/2019  . 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 Toe infection 05/17/2019  . DOE (dyspnea on exertion) 05/15/2019  . Severe tricuspid regurgitation 05/15/2019  . Acute on chronic right-sided heart failure ([place]) 05/15/2019  . Cellulitis of fourth toe of left foot   . Excessive daytime sleepiness 02/11/2019  . 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[place]) 02/11/2019  . Iron deficiency anemia 02/04/2019  . Poor memory 12/16/2018  . Deficiency anemia 11/19/2018  . Other fatigue 11/19/2018  . History of elevated PSA 11/19/2018  . Prostate cancer screening 11/19/2018  . Prostate CA ([place]) 11/19/2018  . Pancytopenia, acquired ([place])  08/13/2018  . Recurrent falls 08/13/2018  . 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 Peripheral neuropathy 07/31/2018  . 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 Anemia, chronic disease 01/28/2018  . History of skin cancer 07/27/2017  . Fatty liver disease, nonalcoholic 40/34/7425  . Thrombocytopenia ([place]) 11/27/2016  . Lung nodule 04/23/2014  . Postinflammatory pulmonary fibrosis ([place]) 06/13/2013  . Long term (current) use of anticoagulants 11/22/2011  . Abdominal bruit 03/07/2011  . Pacemaker-St.Jude   . Hx of CABG   . Hyperlipidemia type II   . OSA (obstructive sleep apnea)   . PACEMAKER, PERMANENT 08/17/2010  . DIABETES MELLITUS 08/16/2010  . HYPERLIPIDEMIA 08/16/2010  .  Obesity 08/16/2010  . Essential hypertension 08/16/2010  . Atrial fibrillation ([place]) 08/16/2010  . IRREGULAR HEART RATE 08/16/2010  . Sleep apnea 08/16/2010  . BENIGN PROSTATIC HYPERTROPHY, HX OF 08/16/2010    Silvestre Mesi 09/08/2019, 6:09 PM  [facility] [address] [place], [place], [address] Phone: [phone]   Fax:  [phone]  Name: [name]. MRN: [id number] Date of Birth: 03/28/1941

## 2019-09-09 ENCOUNTER — Encounter: Payer: Self-pay | Admitting: Adult Health

## 2019-09-09 ENCOUNTER — Encounter: Payer: Self-pay | Admitting: *Deleted

## 2019-09-09 ENCOUNTER — Ambulatory Visit (INDEPENDENT_AMBULATORY_CARE_PROVIDER_SITE_OTHER): Payer: Medicare Other | Admitting: Adult Health

## 2019-09-09 VITALS — BP 92/52 | HR 60 | Ht 68.0 in | Wt 180.6 lb

## 2019-09-09 DIAGNOSIS — I4821 Permanent atrial fibrillation: Secondary | ICD-10-CM | POA: Diagnosis not present

## 2019-09-09 DIAGNOSIS — E861 Hypovolemia: Secondary | ICD-10-CM

## 2019-09-09 DIAGNOSIS — I6330 Cerebral infarction due to thrombosis of unspecified cerebral artery: Secondary | ICD-10-CM | POA: Diagnosis not present

## 2019-09-09 DIAGNOSIS — I9589 Other hypotension: Secondary | ICD-10-CM

## 2019-09-09 DIAGNOSIS — I43 Cardiomyopathy in diseases classified elsewhere: Secondary | ICD-10-CM | POA: Diagnosis not present

## 2019-09-09 DIAGNOSIS — Z79899 Other long term (current) drug therapy: Secondary | ICD-10-CM

## 2019-09-09 DIAGNOSIS — E7800 Pure hypercholesterolemia, unspecified: Secondary | ICD-10-CM

## 2019-09-09 DIAGNOSIS — R296 Repeated falls: Secondary | ICD-10-CM

## 2019-09-09 DIAGNOSIS — I5032 Chronic diastolic (congestive) heart failure: Secondary | ICD-10-CM | POA: Diagnosis not present

## 2019-09-09 LAB — BASIC METABOLIC PANEL
BUN/Creatinine Ratio: 18 (ref 10–24)
BUN: 30 mg/dL — ABNORMAL HIGH (ref 8–27)
CO2: 28 mmol/L (ref 20–29)
Calcium: 9.3 mg/dL (ref 8.6–10.2)
Chloride: 104 mmol/L (ref 96–106)
Creatinine, Ser: 1.64 mg/dL — ABNORMAL HIGH (ref 0.76–1.27)
GFR calc Af Amer: 46 mL/min/{1.73_m2} — ABNORMAL LOW (ref >59)
GFR calc non Af Amer: 40 mL/min/{1.73_m2} — ABNORMAL LOW (ref >59)
Glucose: 88 mg/dL (ref 65–99)
Potassium: 4.3 mmol/L (ref 3.5–5.2)
Sodium: 146 mmol/L — ABNORMAL HIGH (ref 134–144)

## 2019-09-09 MED ORDER — TORSEMIDE 20 MG PO TABS: 20.0000 mg | ORAL_TABLET | Freq: Two times a day (BID) | ORAL | 3 refills | Status: DC

## 2019-09-09 NOTE — Patient Instructions (Signed)
Medication Instructions:  DECREASE- Furosemide(lasix) 20 mg twice a day, take an extra 20 mg as needed for swelling DECREASE- Tamsulosin(Flomax) 0.2 mg(1/2 tablet) by mouth daily  If you need a refill on your cardiac medications before your next appointment, please call your pharmacy.  Labwork: Magnesium and CBC Today HERE IN OUR OFFICE AT [facility]   You will NOT need to fast   Take the provided lab slips with you to the lab for your blood draw.   When you have your labs (blood work) drawn today and your tests are completely normal, you will receive your results only by MyChart Message (if you have MyChart) -OR-  A paper copy in the mail.  If you have any lab test that is abnormal or we need to change your treatment, we will call you to review these results.  Testing/Procedures: None Ordered  Follow-Up: . Your physician recommends that you schedule a follow-up appointment in: 2 weeks   At [facility], you and your health needs are our priority.  As part of our continuing mission to provide you with exceptional heart care, we have created designated Provider Care Teams.  These Care Teams include your primary Cardiologist (physician) and Advanced Practice Providers (APPs -  Physician Assistants and Nurse Practitioners) who all work together to provide you with the care you need, when you need it.  Thank you for choosing [facility] at [facility]!!

## 2019-09-09 NOTE — Addendum Note (Signed)
Addended by: Vennie Homans on: 09/09/2019 04:53 PM   Modules accepted: Orders

## 2019-09-10 DIAGNOSIS — M5431 Sciatica, right side: Secondary | ICD-10-CM | POA: Diagnosis not present

## 2019-09-10 DIAGNOSIS — M9903 Segmental and somatic dysfunction of lumbar region: Secondary | ICD-10-CM | POA: Diagnosis not present

## 2019-09-10 DIAGNOSIS — M9901 Segmental and somatic dysfunction of cervical region: Secondary | ICD-10-CM | POA: Diagnosis not present

## 2019-09-10 DIAGNOSIS — M5432 Sciatica, left side: Secondary | ICD-10-CM | POA: Diagnosis not present

## 2019-09-10 LAB — CBC
Hematocrit: 28.6 % — ABNORMAL LOW (ref 37.5–51.0)
Hemoglobin: 9.4 g/dL — ABNORMAL LOW (ref 13.0–17.7)
MCH: 30.8 pg (ref 26.6–33.0)
MCHC: 32.9 g/dL (ref 31.5–35.7)
MCV: 94 fL (ref 79–97)
Platelets: 82 10*3/uL — CL (ref 150–450)
RBC: 3.05 x10E6/uL — ABNORMAL LOW (ref 4.14–5.80)
RDW: 16.4 % — ABNORMAL HIGH (ref 11.6–15.4)
WBC: 4.9 10*3/uL (ref 3.4–10.8)

## 2019-09-10 LAB — MAGNESIUM: Magnesium: 2.3 mg/dL (ref 1.6–2.3)

## 2019-09-11 ENCOUNTER — Other Ambulatory Visit: Payer: Self-pay | Admitting: Hematology and Oncology

## 2019-09-11 ENCOUNTER — Encounter: Payer: Self-pay | Admitting: Rehabilitative and Restorative Service Providers"

## 2019-09-11 ENCOUNTER — Other Ambulatory Visit: Payer: Self-pay

## 2019-09-11 ENCOUNTER — Ambulatory Visit: Payer: Medicare Other | Admitting: Rehabilitative and Restorative Service Providers"

## 2019-09-11 ENCOUNTER — Telehealth: Payer: Self-pay | Admitting: Pulmonary Disease

## 2019-09-11 DIAGNOSIS — R2681 Unsteadiness on feet: Secondary | ICD-10-CM

## 2019-09-11 DIAGNOSIS — M5432 Sciatica, left side: Secondary | ICD-10-CM | POA: Diagnosis not present

## 2019-09-11 DIAGNOSIS — M6281 Muscle weakness (generalized): Secondary | ICD-10-CM | POA: Diagnosis not present

## 2019-09-11 DIAGNOSIS — R2689 Other abnormalities of gait and mobility: Secondary | ICD-10-CM

## 2019-09-11 DIAGNOSIS — D509 Iron deficiency anemia, unspecified: Secondary | ICD-10-CM

## 2019-09-11 DIAGNOSIS — M9903 Segmental and somatic dysfunction of lumbar region: Secondary | ICD-10-CM | POA: Diagnosis not present

## 2019-09-11 DIAGNOSIS — M5431 Sciatica, right side: Secondary | ICD-10-CM | POA: Diagnosis not present

## 2019-09-11 DIAGNOSIS — M9901 Segmental and somatic dysfunction of cervical region: Secondary | ICD-10-CM | POA: Diagnosis not present

## 2019-09-11 DIAGNOSIS — D638 Anemia in other chronic diseases classified elsewhere: Secondary | ICD-10-CM

## 2019-09-11 NOTE — Addendum Note (Signed)
Addended by: Vennie Homans on: 09/11/2019 11:47 AM   Modules accepted: Orders

## 2019-09-11 NOTE — Patient Instructions (Addendum)
*  PATIENT'S COPY MAY VARY SLIGHTLY.  PT did not initially paste into epic and recreated HEP  Access Code: [email or web address]  URL: [email or web address]  Date: 09/12/2019  Prepared by: Rudell Cobb   Exercises Sit to Stand with Armchair - 10 reps - 1 sets - 1x daily - 7x weekly Standing Marching - 10 reps - 1 sets - 1x daily - 7x weekly Heel rises with counter support - 10 reps - 1 sets - 1x daily - 7x weekly Toe Raises with Counter Support - 10 reps - 1 sets - 1x daily - 7x weekly Forward Step Over with Counter Support - 10 reps - 1 sets - 1x daily - 7x weekly

## 2019-09-12 NOTE — Telephone Encounter (Signed)
Scheduled patient on 11/10-pr

## 2019-09-12 NOTE — Therapy (Signed)
[facility] [address] [place], [place], [address] Phone: [phone]   Fax:  [phone]  Physical Therapy Treatment  Patient Details  Name: [name]. MRN: [id number] Date of Birth: Jul 04, 1941 Referring Provider (PT): Alysia Penna, MD   Encounter Date: 09/11/2019  PT End of Session - 09/11/19 2022    Visit Number  9    Number of Visits  17    Date for PT Re-Evaluation  10/06/19    Authorization Type  medicare and AARP supplement    PT Start Time  1750    PT Stop Time  9390    PT Time Calculation (min)  43 min    Activity Tolerance  Patient tolerated treatment well    Behavior During Therapy  [facility] for tasks assessed/performed       Past Medical History:  Diagnosis Date  . (HFpEF) heart failure with preserved ejection fraction ([place])    a. 05/2013 Echo: EF 55%, mild LVH, diast dysfxn, Ao sclerosis, mildly dil LA, RV dysfxn (poorly visualized), PASP 24mHg;  b. 03/2017 Echo: EF 55-60%, no rwma, triv MR, mildly dil RV, mod TR, PASP 469mg.  . [facility]   s/p Cox Maze 1/09;  Multaq Rx d/c'd in 2014 due to pulmo fibrosis;  coumadin d/c'd in 2014 due to spontaneous subdural hematoma  . BPH (benign prostatic hyperplasia)   . CAD (coronary artery disease), native coronary artery    a. s/p CABG 12/2007;  b. Myoview 12/2011: EF 66%, no scar or ischemia; normal.  . DM (diabetes mellitus) ([place]  . Hyperlipidemia type II   . Hypertension   . 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 OSA (obstructive sleep apnea)   . Pacemaker    PPM - St. Jude  . Peripheral neuropathy 07/31/2018  . Pulmonary fibrosis ([place]CD[place]   Multaq d/c'd 7/14  . Subdural hematoma ([facility]08/2013   spontaneous;  coumadin d/c'd => no longer a candidate for anticoagulation    Past Surgical History:  Procedure Laterality Date  . AMPUTATION Left 05/17/2019   Procedure: AMPUTATION LEFT FOURTH TOE;   Surgeon: DeMeredith PelMD;  Location: [facility] Service: Orthopedics;  Laterality: Left;  . APPENDECTOMY    . CHOLECYSTECTOMY    . CORONARY ARTERY BYPASS GRAFT     x3 (left internal mammary artery to distal left anterior descending coronary artery, saphenous vain graft to second circumflex marginal branch, saphenous vain graft to posterior descending coronary artery, endoscopic saphenous vain harvest from right thigh) and modified Cox - Maze IV procedure.  ClValentina GuOwen,MD. Electronically signed CHO/MEDQ D: 01/09/2008/ JOB: [id number]:  JoIran SizerD  . CRKyla Balzarine8/13/2013   Procedure: CRANIOTOMY HEMATOMA EVACUATION SUBDURAL;  Surgeon: GaElaina HoopsMD;  [facility];  Service: Neurosurgery;  Laterality: Right;  Right craniotomy for evacuation of subdural hematoma  . FOOT SURGERY    . HERNIA REPAIR    . ORCHIECTOMY     Left  /  testicular cancer  . PACEMAKER PLACEMENT     PPM - St. Jude  . PPM GENERATOR CHANGEOUT N/A 06/25/2019   Procedure: PPM GENERATOR CHANGEOUT;  Surgeon: TaEvans LanceMD;  Location: [facility];  Service: Cardiovascular;  Laterality: N/A;    There were no vitals filed for this visit.  Subjective Assessment - 09/11/19 1757    Subjective  The patient arrives with bruising on  his face and arms/hands.  He had a fall on Saturday reporting that he got out of bed and was unable to move his feet and fell forward on his face.  He then fell later that day and cut his hands/bruised his arms falling into the Rollator RW.    Pertinent History  Lewy body dementia, debility, CHF, diabetes, CABG.    Patient Stated Goals  The patient was not using the walker before the stroke.    Currently in Pain?  No/denies                       [facility] Adult PT Treatment/Exercise - 09/11/19 1822      Transfers   Transfers  Sit to Stand;Stand to Sit    Sit to Stand  6: Modified independent (Device/Increase time)    Stand to Sit  6: Modified independent  (Device/Increase time)      Ambulation/Gait   Ambulation/Gait  Yes    Ambulation/Gait Assistance  5: Supervision    Ambulation Distance (Feet)  150 Feet   75 x 2 reps   Assistive device  4-wheeled walker    Gait Pattern  Step-through pattern    Ambulation Surface  Level;Indoor      Self-Care   Self-Care  Other Self-Care Comments    Other Self-Care Comments   PT discussed fall circumstances with the patient.  He notes he does not remember to use his rollator RW in the home.  PT spent 6+ minutes at end of session with the patient's wife discussing home supervision.   Also provided written copy of HEP and       Neuro Re-ed    Neuro Re-ed Details   Standing stpping forward, return to midline near countertop.      Exercises   Exercises  Other Exercises    Other Exercises   Sit<>stand x 5 reps, heel raises x 10 reps, toe raises x 10 reps, sidestepping at countertop, marching with one UE support.             PT Education - 09/11/19 2310    Education Details  updated HEP    Person(s) Educated  Patient    Methods  Explanation;Demonstration;Handout    Comprehension  Verbalized understanding;Returned demonstration       PT Short Term Goals - 09/11/19 1822      PT SHORT TERM GOAL #1   Title  The patient will perform HEP with wife's assist emphasizing LE strength, balance, general mobility.    Time  4    Period  Weeks    Status  On-going    Target Date  09/06/19      PT SHORT TERM GOAL #2   Title  The patient will improve Berg balance score from 35/56 to > or equal to 40/56 to demo dec'ing risk for falls.    Baseline  41/56    Time  4    Period  Weeks    Status  Achieved    Target Date  09/06/19      PT SHORT TERM GOAL #3   Title  The patient will improve gait speed from 1.47 ft/sec to > or equal to 2.0 ft/sec with rollator RW to demo improving functional mobility and dec'd risk for falls.    Baseline  1.86 ft/sec with rollator RW on 09/04/2019    Time  4    Period  Weeks     Status  Partially Met  Target Date  09/06/19      PT SHORT TERM GOAL #4   Title  The patient will demo floor<>stand with UE support and supervision due to h/o frequent falls.    Baseline  PT demonstrated and the patient performed with supervision.  *Upon standing on mat, the patient needed CGA due to more compliant surface.    Time  4    Period  Weeks    Status  Achieved    Target Date  09/06/19      PT SHORT TERM GOAL #5   Title  The patient's wife will report improved ability to rise from couch surfaces in the home.    Baseline  variable performance    Time  4    Period  Weeks    Status  Partially Met    Target Date  09/06/19        PT Long Term Goals - 08/07/19 1843      PT LONG TERM GOAL #1   Title  The patient will be able to perform HEP progression with wife's assistance.    Time  8    Period  Weeks    Target Date  10/06/19      PT LONG TERM GOAL #2   Title  The patient will perform stair negotiation with reciprocal pattern + one rail x 4 steps mod indep.    Time  8    Period  Weeks    Target Date  10/06/19      PT LONG TERM GOAL #3   Title  The patient will be able to ambulate in clinic without a device x 200 ft with supervision without loss of balance (as he forgets his device at home).    Time  8    Period  Weeks    Target Date  10/06/19      PT LONG TERM GOAL #4   Title  The patient will improve Berg from 35/56 to > or equal to 44/56 to demo dec'ing risk for falls.    Time  8    Period  Weeks    Target Date  10/06/19            Plan - 09/11/19 2319    Clinical Impression Statement  In discussion with the patient's wife, she reports he has been referred to a hematologist due to anemia.  He also had med changes due to low blood pressure.  The patient's fall risk is multi-factorial with dementia leading to dec'd use of assistive device, possible medical contributing factors, as well as leg weakness and fatigue iwth mobility.  PT to focus on  specific caregiver education writing down simple, clear instructions for the patinet's wife to provide to improve consistency in the home.    PT Treatment/Interventions  ADLs/Self Care Home Management;Neuromuscular re-education;Patient/family education;Gait training;Stair training;Functional mobility training;Therapeutic activities;Therapeutic exercise;Balance training;DME Instruction    PT Next Visit Plan  10th visit note; check HEP, check goals, PROVIDE:  patient and wife education about using simple, clear, repetitive steps to improve mobility    Consulted and Agree with Plan of Care  Patient;Family member/caregiver    Family Member Consulted  spoke with wife at end of session.       Patient will benefit from skilled therapeutic intervention in order to improve the following deficits and impairments:  Abnormal gait, Decreased activity tolerance, Decreased balance, Decreased strength, Decreased mobility, Decreased safety awareness, Decreased coordination  Visit Diagnosis: Other abnormalities of gait and mobility  Unsteadiness on feet  Muscle weakness (generalized)     Problem List Patient Active Problem List   Diagnosis Date Noted  . Acute on chronic diastolic heart failure ([place]) 06/03/2019  . Coronary artery disease involving native heart without angina pectoris 06/03/2019  . Debility 05/26/2019  . 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 Toe infection 05/17/2019  . DOE (dyspnea on exertion) 05/15/2019  . Severe tricuspid regurgitation 05/15/2019  . Acute on chronic right-sided heart failure ([place]) 05/15/2019  . Cellulitis of fourth toe of left foot   . Excessive daytime sleepiness 02/11/2019  . 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[place]) 02/11/2019  . Iron deficiency anemia 02/04/2019  . Poor memory 12/16/2018  . Deficiency anemia 11/19/2018  . Other fatigue 11/19/2018  . History of elevated PSA 11/19/2018  . Prostate cancer screening 11/19/2018   . Prostate CA ([place]) 11/19/2018  . Pancytopenia, acquired ([place]) 08/13/2018  . Recurrent falls 08/13/2018  . 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 Peripheral neuropathy 07/31/2018  . 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 Anemia, chronic disease 01/28/2018  . History of skin cancer 07/27/2017  . Fatty liver disease, nonalcoholic 56/11/5482  . Thrombocytopenia ([place]) 11/27/2016  . Lung nodule 04/23/2014  . Postinflammatory pulmonary fibrosis ([place]) 06/13/2013  . Long term (current) use of anticoagulants 11/22/2011  . Abdominal bruit 03/07/2011  . Pacemaker-St.Jude   . Hx of CABG   . Hyperlipidemia type II   . OSA (obstructive sleep apnea)   . PACEMAKER, PERMANENT 08/17/2010  . HYPERLIPIDEMIA 08/16/2010  . Obesity 08/16/2010  . Essential hypertension 08/16/2010  . Atrial fibrillation ([place]) 08/16/2010  . IRREGULAR HEART RATE 08/16/2010  . Sleep apnea 08/16/2010  . BENIGN PROSTATIC HYPERTROPHY, HX OF 08/16/2010    [name], PT 09/12/2019, 5:49 PM  [facility] [address] [place], [place], [address] Phone: [phone]   Fax:  [phone]  Name: [name]. MRN: [id number] Date of Birth: 1941/03/25

## 2019-09-15 ENCOUNTER — Ambulatory Visit: Payer: Medicare Other | Admitting: Physical Therapy

## 2019-09-15 ENCOUNTER — Telehealth: Payer: Self-pay | Admitting: Hematology and Oncology

## 2019-09-15 ENCOUNTER — Other Ambulatory Visit: Payer: Self-pay

## 2019-09-15 DIAGNOSIS — R2689 Other abnormalities of gait and mobility: Secondary | ICD-10-CM

## 2019-09-15 DIAGNOSIS — M6281 Muscle weakness (generalized): Secondary | ICD-10-CM

## 2019-09-15 DIAGNOSIS — R2681 Unsteadiness on feet: Secondary | ICD-10-CM | POA: Diagnosis not present

## 2019-09-15 NOTE — Therapy (Signed)
[facility] [address] [place], [place], [address] Phone: [phone]   Fax:  [phone]  Physical Therapy Treatment/progress note Progress Note reporting period 08/07/19 to 09/15/19  See below for objective and subjective measurements relating to patients progress with PT.   Patient Details  Name: [name]. MRN: [id number] Date of Birth: [birth year] Referring Provider (PT): Alysia Penna, MD   Encounter Date: 09/15/2019  PT End of Session - 09/15/19 1703    Visit Number  10    Number of Visits  17    Date for PT Re-Evaluation  10/06/19    Authorization Type  medicare and AARP supplement    PT Start Time  1619    PT Stop Time  1700    PT Time Calculation (min)  41 min    Activity Tolerance  Patient tolerated treatment well    Behavior During Therapy  [facility] for tasks assessed/performed       Past Medical History:  Diagnosis Date  . (HFpEF) heart failure with preserved ejection fraction ([place])    a. 05/2013 Echo: EF 55%, mild LVH, diast dysfxn, Ao sclerosis, mildly dil LA, RV dysfxn (poorly visualized), PASP 45mmHg;  b. 03/2017 Echo: EF 55-60%, no rwma, triv MR, mildly dil RV, mod TR, PASP 28mmHg.  . Atrial fibrillation [facility])    s/p Cox Maze 1/09;  Multaq Rx d/c'd in 2014 due to pulmo fibrosis;  coumadin d/c'd in 2014 due to spontaneous subdural hematoma  . BPH (benign prostatic hyperplasia)   . CAD (coronary artery disease), native coronary artery    a. s/p CABG 12/2007;  b. Myoview 12/2011: EF 66%, no scar or ischemia; normal.  . DM (diabetes mellitus) ([place])   . Hyperlipidemia type II   . Hypertension   . 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 OSA (obstructive sleep apnea)   . Pacemaker    PPM - St. Jude  . Peripheral neuropathy 07/31/2018  . Pulmonary fibrosis ([place])    Multaq d/c'd 7/14  . Subdural hematoma ([place]) 07/2012   spontaneous;  coumadin d/c'd => no longer a  candidate for anticoagulation    Past Surgical History:  Procedure Laterality Date  . AMPUTATION Left 05/17/2019   Procedure: AMPUTATION LEFT FOURTH TOE;  Surgeon: Meredith Pel, MD;  Location: [place];  Service: Orthopedics;  Laterality: Left;  . APPENDECTOMY    . CHOLECYSTECTOMY    . CORONARY ARTERY BYPASS GRAFT     x3 (left internal mammary artery to distal left anterior descending coronary artery, saphenous vain graft to second circumflex marginal branch, saphenous vain graft to posterior descending coronary artery, endoscopic saphenous vain harvest from right thigh) and modified Cox - Maze IV procedure.  [name],MD. Electronically signed CHO/MEDQ D: 01/09/2008/ JOB: [id number] cc:  [name]  . [name]  07/30/2012   Procedure: CRANIOTOMY HEMATOMA EVACUATION SUBDURAL;  Surgeon: [name], MD;  Location: [place] [facility];  Service: Neurosurgery;  Laterality: Right;  Right craniotomy for evacuation of subdural hematoma  . FOOT SURGERY    . HERNIA REPAIR    . ORCHIECTOMY     Left  /  testicular cancer  . PACEMAKER PLACEMENT     PPM - St. Jude  . PPM GENERATOR CHANGEOUT N/A 06/25/2019   Procedure: PPM GENERATOR CHANGEOUT;  Surgeon: Evans Lance, MD;  Location: [facility];  Service: Cardiovascular;  Laterality: N/A;    There were  no vitals filed for this visit.  Subjective Assessment - 09/15/19 1702    Subjective  no falls since last saturday 09/07/19. Relays some neck pain but "not much to be done about it"    Pertinent History  Lewy body dementia, debility, CHF, diabetes, CABG.    Patient Stated Goals  The patient was not using the walker before the stroke.         [facility] PT Assessment - 09/15/19 0001      Assessment   Medical Diagnosis  stroke, debility, lewy body dementia    Referring Provider (PT)  Alysia Penna, MD      AROM   Overall AROM   Within functional limits for tasks performed      Strength   Overall Strength Comments  Bilateral  shoulder flexion 5/5, shoulder abduction 5/5, elbow flexion 5/5.  Hip flexion 4/5, knee flexion/extension 5/5, ankle DF 4+/5. (all tested in sitting)      Ambulation/Gait   Ambulation/Gait  Yes    Ambulation/Gait Assistance  5: Supervision    Ambulation Distance (Feet)  345 Feet    Assistive device  4-wheeled walker    Gait Pattern  Step-through pattern    Ambulation Surface  Level;Indoor    Gait velocity  2.32   (8.2 sec for 20 ft walk test)     Berg Balance Test   Sit to Stand  Able to stand without using hands and stabilize independently    Standing Unsupported  Able to stand safely 2 minutes    Sitting with Back Unsupported but Feet Supported on Floor or Stool  Able to sit safely and securely 2 minutes    Stand to Sit  Sits safely with minimal use of hands    Transfers  Able to transfer safely, minor use of hands    Standing Unsupported with Eyes Closed  Able to stand 10 seconds with supervision    Standing Unsupported with Feet Together  Able to place feet together independently and stand for 1 minute with supervision    From Standing, Reach Forward with Outstretched Arm  Can reach forward >12 cm safely (5")    From Standing Position, Pick up Object from [place] to pick up shoe, needs supervision    From Standing Position, Turn to Look Behind Over each Shoulder  Looks behind from both sides and weight shifts well    Turn 360 Degrees  Able to turn 360 degrees safely but slowly    Standing Unsupported, Alternately Place Feet on Step/Stool  Able to complete 4 steps without aid or supervision    Standing Unsupported, One Foot in Front  Able to take small step independently and hold 30 seconds    Standing on One Leg  Tries to lift leg/unable to hold 3 seconds but remains standing independently    Total Score  43       Exercises Walking laps with rollator 3 laps 330 ft total with supervision Sit to Stand with Armchair  No UE support 2X10 reps Standing Marching - X 15 reps bilat  with UE suppport Heel rises with counter support - 15 reps Toe Raises with Counter Support - 15 reps Forward Step Over small object and back X 15 each side with UE support Toe taps on cone no UE support 2X 5 bilat   PT Short Term Goals - 09/15/19 1707      PT SHORT TERM GOAL #1   Title  The patient will perform HEP with  wife's assist emphasizing LE strength, balance, general mobility.    Time  4    Period  Weeks    Status  On-going    Target Date  09/06/19      PT SHORT TERM GOAL #2   Title  The patient will improve Berg balance score from 35/56 to > or equal to 40/56 to demo dec'ing risk for falls.    Baseline  43/56    Time  4    Period  Weeks    Status  Achieved    Target Date  09/06/19      PT SHORT TERM GOAL #3   Title  The patient will improve gait speed from 1.47 ft/sec to > or equal to 2.0 ft/sec with rollator RW to demo improving functional mobility and dec'd risk for falls.    Baseline  2.3 ft/sec with rollator RW on 09/04/2019    Time  4    Period  Weeks    Status  Achieved    Target Date  09/06/19      PT SHORT TERM GOAL #4   Title  The patient will demo floor<>stand with UE support and supervision due to h/o frequent falls.    Baseline  PT demonstrated and the patient performed with supervision.  *Upon standing on mat, the patient needed CGA due to more compliant surface.    Time  4    Period  Weeks    Status  Achieved    Target Date  09/06/19      PT SHORT TERM GOAL #5   Title  The patient's wife will report improved ability to rise from couch surfaces in the home.    Baseline  variable performance but relays overall better    Time  4    Period  Weeks    Status  Achieved    Target Date  09/06/19        PT Long Term Goals - 08/07/19 1843      PT LONG TERM GOAL #1   Title  The patient will be able to perform HEP progression with wife's assistance.    Time  8    Period  Weeks    Target Date  10/06/19      PT LONG TERM GOAL #2   Title  The patient  will perform stair negotiation with reciprocal pattern + one rail x 4 steps mod indep.    Time  8    Period  Weeks    Target Date  10/06/19      PT LONG TERM GOAL #3   Title  The patient will be able to ambulate in clinic without a device x 200 ft with supervision without loss of balance (as he forgets his device at home).    Time  8    Period  Weeks    Target Date  10/06/19      PT LONG TERM GOAL #4   Title  The patient will improve Berg from 35/56 to > or equal to 44/56 to demo dec'ing risk for falls.    Time  8    Period  Weeks    Target Date  10/06/19            Plan - 09/15/19 1703    Clinical Impression Statement  MCR progress note performed today, he showed improvements in BERG balance score from 35 at eval to 43 today. He showed improvements in gait speed as well. He continues to  fall at home and will continue to benefit from skilled PT for safety awarness, leg strenght, endurance, and balance.    PT Treatment/Interventions  ADLs/Self Care Home Management;Neuromuscular re-education;Patient/family education;Gait training;Stair training;Functional mobility training;Therapeutic activities;Therapeutic exercise;Balance training;DME Instruction    PT Next Visit Plan  PROVIDE:  patient and wife education about using simple, clear, repetitive steps to improve mobility    PT Home Exercise Plan  sit to stand, standing marching, heel toe raises, fwd step over at counter    Consulted and Agree with Plan of Care  Patient;Family member/caregiver    Family Member Consulted  --       Patient will benefit from skilled therapeutic intervention in order to improve the following deficits and impairments:  Abnormal gait, Decreased activity tolerance, Decreased balance, Decreased strength, Decreased mobility, Decreased safety awareness, Decreased coordination  Visit Diagnosis: Other abnormalities of gait and mobility  Unsteadiness on feet  Muscle weakness (generalized)     Problem  List Patient Active Problem List   Diagnosis Date Noted  . Acute on chronic diastolic heart failure ([place]) 06/03/2019  . Coronary artery disease involving native heart without angina pectoris 06/03/2019  . Debility 05/26/2019  . 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 Toe infection 05/17/2019  . DOE (dyspnea on exertion) 05/15/2019  . Severe tricuspid regurgitation 05/15/2019  . Acute on chronic right-sided heart failure ([place]) 05/15/2019  . Cellulitis of fourth toe of left foot   . Excessive daytime sleepiness 02/11/2019  . 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[place]) 02/11/2019  . Iron deficiency anemia 02/04/2019  . Poor memory 12/16/2018  . Deficiency anemia 11/19/2018  . Other fatigue 11/19/2018  . History of elevated PSA 11/19/2018  . Prostate cancer screening 11/19/2018  . Prostate CA ([place]) 11/19/2018  . Pancytopenia, acquired ([place]) 08/13/2018  . Recurrent falls 08/13/2018  . 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 Peripheral neuropathy 07/31/2018  . 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 Anemia, chronic disease 01/28/2018  . History of skin cancer 07/27/2017  . Fatty liver disease, nonalcoholic [id number]  . Thrombocytopenia ([place]) 11/27/2016  . Lung nodule 04/23/2014  . Postinflammatory pulmonary fibrosis ([place]) 06/13/2013  . Long term (current) use of anticoagulants 11/22/2011  . Abdominal bruit 03/07/2011  . Pacemaker-St.Jude   . Hx of CABG   . Hyperlipidemia type II   . OSA (obstructive sleep apnea)   . PACEMAKER, PERMANENT 08/17/2010  . HYPERLIPIDEMIA 08/16/2010  . Obesity 08/16/2010  . Essential hypertension 08/16/2010  . Atrial fibrillation ([place]) 08/16/2010  . IRREGULAR HEART RATE 08/16/2010  . Sleep apnea 08/16/2010  . BENIGN PROSTATIC HYPERTROPHY, HX OF 08/16/2010    Silvestre Mesi 09/15/2019, 5:19 PM  [facility] [address]  [place], [place], [address] Phone: [phone]   Fax:  [phone]  Name: [name]. MRN: [id number] Date of Birth: Oct 22, 1941

## 2019-09-15 NOTE — Telephone Encounter (Signed)
Scheduled appt per 9/24 sch message - unable to reach pt . Left message with appt date and time

## 2019-09-16 DIAGNOSIS — R1310 Dysphagia, unspecified: Secondary | ICD-10-CM | POA: Diagnosis not present

## 2019-09-16 DIAGNOSIS — Z86010 Personal history of colonic polyps: Secondary | ICD-10-CM | POA: Diagnosis not present

## 2019-09-18 ENCOUNTER — Other Ambulatory Visit: Payer: Self-pay

## 2019-09-18 ENCOUNTER — Encounter: Payer: Self-pay | Admitting: Rehabilitative and Restorative Service Providers"

## 2019-09-18 ENCOUNTER — Ambulatory Visit
Payer: Medicare Other | Attending: Physical Medicine & Rehabilitation | Admitting: Rehabilitative and Restorative Service Providers"

## 2019-09-18 DIAGNOSIS — R2689 Other abnormalities of gait and mobility: Secondary | ICD-10-CM | POA: Insufficient documentation

## 2019-09-18 DIAGNOSIS — M6281 Muscle weakness (generalized): Secondary | ICD-10-CM | POA: Diagnosis not present

## 2019-09-18 DIAGNOSIS — R2681 Unsteadiness on feet: Secondary | ICD-10-CM

## 2019-09-18 NOTE — Patient Instructions (Signed)
Safety For Walking:  1) Keep the walker near you when you are sitting and right beside the bed so you see it when you get up.  2) Make sure to have the walker with you when you are standing or walking.  3) Lock the wheel locks before sitting and leave them locked until you stand up and feel steady.

## 2019-09-18 NOTE — Therapy (Signed)
[facility] [address] [place], [place], [address] Phone: [phone]   Fax:  [phone]  Physical Therapy Treatment  Patient Details  Name: [name]. MRN: [id number] Date of Birth: August 02, 1941 Referring Provider (PT): Alysia Penna, MD   Encounter Date: 09/18/2019  PT End of Session - 09/18/19 1634    Visit Number  11    Number of Visits  17    Date for PT Re-Evaluation  10/06/19    Authorization Type  medicare and AARP supplement    PT Start Time  1630   began at session 4:20 but patient needed to use restroom   PT Stop Time  1700    PT Time Calculation (min)  30 min    Activity Tolerance  Patient tolerated treatment well    Behavior During Therapy  [facility] for tasks assessed/performed       Past Medical History:  Diagnosis Date  . (HFpEF) heart failure with preserved ejection fraction ([place])    a. 05/2013 Echo: EF 55%, mild LVH, diast dysfxn, Ao sclerosis, mildly dil LA, RV dysfxn (poorly visualized), PASP 102mHg;  b. 03/2017 Echo: EF 55-60%, no rwma, triv MR, mildly dil RV, mod TR, PASP 429mg.  . [facility]   s/p Cox Maze 1/09;  Multaq Rx d/c'd in 2014 due to pulmo fibrosis;  coumadin d/c'd in 2014 due to spontaneous subdural hematoma  . BPH (benign prostatic hyperplasia)   . CAD (coronary artery disease), native coronary artery    a. s/p CABG 12/2007;  b. Myoview 12/2011: EF 66%, no scar or ischemia; normal.  . DM (diabetes mellitus) ([place]  . Hyperlipidemia type II   . Hypertension   . 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 OSA (obstructive sleep apnea)   . Pacemaker    PPM - St. Jude  . Peripheral neuropathy 07/31/2018  . Pulmonary fibrosis ([place]   Multaq d/c'd 7/14  . Subdural hematoma ([place]CD[place]08/2013   spontaneous;  coumadin d/c'd => no longer a candidate for anticoagulation    Past Surgical History:  Procedure Laterality Date  . AMPUTATION  Left 05/17/2019   Procedure: AMPUTATION LEFT FOURTH TOE;  Surgeon: DeMeredith PelMD;  [facility] Service: Orthopedics;  Laterality: Left;  . APPENDECTOMY    . CHOLECYSTECTOMY    . CORONARY ARTERY BYPASS GRAFT     x3 (left internal mammary artery to distal left anterior descending coronary artery, saphenous vain graft to second circumflex marginal branch, saphenous vain graft to posterior descending coronary artery, endoscopic saphenous vain harvest from right thigh) and modified Cox - Maze IV procedure.  ClValentina GuOwen,MD. Electronically signed CHO/MEDQ D: 01/09/2008/ JOB: [id number]:  JoIran SizerD  . CRKyla Balzarine8/13/2013   Procedure: CRANIOTOMY HEMATOMA EVACUATION SUBDURAL;  Surgeon: GaElaina HoopsMD;  [facility];  Service: Neurosurgery;  Laterality: Right;  Right craniotomy for evacuation of subdural hematoma  . FOOT SURGERY    . HERNIA REPAIR    . ORCHIECTOMY     Left  /  testicular cancer  . PACEMAKER PLACEMENT     PPM - St. Jude  . PPM GENERATOR CHANGEOUT N/A 06/25/2019   Procedure: PPM GENERATOR CHANGEOUT;  Surgeon: TaEvans LanceMD;  [facility];  Service: Cardiovascular;  Laterality: N/A;    There were no vitals filed for this visit.  Subjective Assessment - 09/18/19 1641  Subjective  The patient reports he is not as sore this week (from fall last week).    Pertinent History  Lewy body dementia, debility, CHF, diabetes, CABG.    Patient Stated Goals  The patient was not using the walker before the stroke.    Currently in Pain?  No/denies                       [facility] Adult PT Treatment/Exercise - 09/18/19 1703      Ambulation/Gait   Ambulation/Gait  Yes    Ambulation/Gait Assistance  5: Supervision    Ambulation Distance (Feet)  230 Feet   x 3   Assistive device  4-wheeled walker    Gait Pattern  Step-through pattern    Ambulation Surface  Level;Indoor      Self-Care   Self-Care  Other Self-Care Comments     Other Self-Care Comments   The patient arrived today noting he forgets to use the rollator RW in the home.  PT called patient's wife (waiting in the car) to have her come into the clinic to discuss safety.  We talked about need for rollator RW and recommended procedure to have rollator with him at all times.  Taped a reminder sign to walker and demonstrated how to position the walker near bed, while sitting in a chair, and while sitting at kitchen table.  Discussed that if we cannot reduce falls there are strategies that may help reduce injuries (discussed hip protectors).  Also discussed options for home of video monitor for wife to check on what patinet is doing or a chair alarm that would alert her when he gets up so she can ensure he is using a device.             PT Education - 09/18/19 2237    Education Details  extensive patient and caregiver education due to h/o falls and recent serious fall    Person(s) Educated  Patient;Spouse    Methods  Explanation;Handout    Comprehension  Verbalized understanding       PT Short Term Goals - 09/18/19 2237      PT SHORT TERM GOAL #1   Title  The patient will perform HEP with wife's assist emphasizing LE strength, balance, general mobility.    Baseline  The patient's wife does not assist at home; patient reports he does occasionally, however not consistent with his response.    Time  4    Period  Weeks    Status  Not Met    Target Date  09/06/19      PT SHORT TERM GOAL #2   Title  The patient will improve Berg balance score from 35/56 to > or equal to 40/56 to demo dec'ing risk for falls.    Baseline  43/56    Time  4    Period  Weeks    Status  Achieved    Target Date  09/06/19      PT SHORT TERM GOAL #3   Title  The patient will improve gait speed from 1.47 ft/sec to > or equal to 2.0 ft/sec with rollator RW to demo improving functional mobility and dec'd risk for falls.    Baseline  2.3 ft/sec with rollator RW on 09/04/2019     Time  4    Period  Weeks    Status  Achieved    Target Date  09/06/19      PT SHORT TERM GOAL #  4   Title  The patient will demo floor<>stand with UE support and supervision due to h/o frequent falls.    Baseline  PT demonstrated and the patient performed with supervision.  *Upon standing on mat, the patient needed CGA due to more compliant surface.    Time  4    Period  Weeks    Status  Achieved    Target Date  09/06/19      PT SHORT TERM GOAL #5   Title  The patient's wife will report improved ability to rise from couch surfaces in the home.    Baseline  variable performance but relays overall better    Time  4    Period  Weeks    Status  Achieved    Target Date  09/06/19        PT Long Term Goals - 08/07/19 1843      PT LONG TERM GOAL #1   Title  The patient will be able to perform HEP progression with wife's assistance.    Time  8    Period  Weeks    Target Date  10/06/19      PT LONG TERM GOAL #2   Title  The patient will perform stair negotiation with reciprocal pattern + one rail x 4 steps mod indep.    Time  8    Period  Weeks    Target Date  10/06/19      PT LONG TERM GOAL #3   Title  The patient will be able to ambulate in clinic without a device x 200 ft with supervision without loss of balance (as he forgets his device at home).    Time  8    Period  Weeks    Target Date  10/06/19      PT LONG TERM GOAL #4   Title  The patient will improve Berg from 35/56 to > or equal to 44/56 to demo dec'ing risk for falls.    Time  8    Period  Weeks    Target Date  10/06/19            Plan - 09/18/19 2247    Clinical Impression Statement  Today's session focused on home safety and strategies to help reduce fall risk due to h/o frequent, serious falls.  The patient forgets his rollator RW and sometimes goes outdoors without his wife knowing.  PT educated patient and his wife on a few options that we need to consider to reduce falls.  This week, we are focusing  on using the rollator RW.    PT Treatment/Interventions  ADLs/Self Care Home Management;Neuromuscular re-education;Patient/family education;Gait training;Stair training;Functional mobility training;Therapeutic activities;Therapeutic exercise;Balance training;DME Instruction    PT Next Visit Plan  Check on how use of rollator RW going; d/c planning (ending # of visits), review HEP    Consulted and Agree with Plan of Care  Patient;Family member/caregiver    Family Member Consulted  wife able to come in.       Patient will benefit from skilled therapeutic intervention in order to improve the following deficits and impairments:  Abnormal gait, Decreased activity tolerance, Decreased balance, Decreased strength, Decreased mobility, Decreased safety awareness, Decreased coordination  Visit Diagnosis: Unsteadiness on feet  Other abnormalities of gait and mobility  Muscle weakness (generalized)     Problem List Patient Active Problem List   Diagnosis Date Noted  . Acute on chronic diastolic heart failure ([place]) 06/03/2019  . Coronary artery  disease involving native heart without angina pectoris 06/03/2019  . Debility 05/26/2019  . 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 Toe infection 05/17/2019  . DOE (dyspnea on exertion) 05/15/2019  . Severe tricuspid regurgitation 05/15/2019  . Acute on chronic right-sided heart failure ([place]) 05/15/2019  . Cellulitis of fourth toe of left foot   . Excessive daytime sleepiness 02/11/2019  . 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[place]) 02/11/2019  . Iron deficiency anemia 02/04/2019  . Poor memory 12/16/2018  . Deficiency anemia 11/19/2018  . Other fatigue 11/19/2018  . History of elevated PSA 11/19/2018  . Prostate cancer screening 11/19/2018  . Prostate CA ([place]) 11/19/2018  . Pancytopenia, acquired ([place]) 08/13/2018  . Recurrent falls 08/13/2018  . 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 Peripheral neuropathy  07/31/2018  . 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 Anemia, chronic disease 01/28/2018  . History of skin cancer 07/27/2017  . Fatty liver disease, nonalcoholic 69/62/9528  . Thrombocytopenia ([place]) 11/27/2016  . Lung nodule 04/23/2014  . Postinflammatory pulmonary fibrosis ([place]) 06/13/2013  . Long term (current) use of anticoagulants 11/22/2011  . Abdominal bruit 03/07/2011  . Pacemaker-St.Jude   . Hx of CABG   . Hyperlipidemia type II   . OSA (obstructive sleep apnea)   . PACEMAKER, PERMANENT 08/17/2010  . HYPERLIPIDEMIA 08/16/2010  . Obesity 08/16/2010  . Essential hypertension 08/16/2010  . Atrial fibrillation ([place]) 08/16/2010  . IRREGULAR HEART RATE 08/16/2010  . Sleep apnea 08/16/2010  . BENIGN PROSTATIC HYPERTROPHY, HX OF 08/16/2010    Angenette Daily, PT 09/18/2019, 10:51 PM  [facility] [address] [place], [place], [address] Phone: [phone]   Fax:  [phone]  Name: [name]. MRN: [id number] Date of Birth: 12/18/1941

## 2019-09-22 ENCOUNTER — Encounter: Payer: Self-pay | Admitting: Rehabilitative and Restorative Service Providers"

## 2019-09-22 ENCOUNTER — Other Ambulatory Visit: Payer: Self-pay

## 2019-09-22 ENCOUNTER — Ambulatory Visit: Payer: Medicare Other | Admitting: Rehabilitative and Restorative Service Providers"

## 2019-09-22 DIAGNOSIS — M5431 Sciatica, right side: Secondary | ICD-10-CM | POA: Diagnosis not present

## 2019-09-22 DIAGNOSIS — R2681 Unsteadiness on feet: Secondary | ICD-10-CM

## 2019-09-22 DIAGNOSIS — M5432 Sciatica, left side: Secondary | ICD-10-CM | POA: Diagnosis not present

## 2019-09-22 DIAGNOSIS — M6281 Muscle weakness (generalized): Secondary | ICD-10-CM | POA: Diagnosis not present

## 2019-09-22 DIAGNOSIS — M9901 Segmental and somatic dysfunction of cervical region: Secondary | ICD-10-CM | POA: Diagnosis not present

## 2019-09-22 DIAGNOSIS — R2689 Other abnormalities of gait and mobility: Secondary | ICD-10-CM

## 2019-09-22 DIAGNOSIS — M9903 Segmental and somatic dysfunction of lumbar region: Secondary | ICD-10-CM | POA: Diagnosis not present

## 2019-09-22 NOTE — Therapy (Signed)
[facility] [address] [place], [place], [address] Phone: [phone]   Fax:  [phone]  Physical Therapy Treatment  Patient Details  Name: [name]. MRN: [id number] Date of Birth: 19-Jan-1941 Referring Provider ([name]): Alysia Penna, MD   Encounter Date: 09/22/2019  [name] End of Session - 09/22/19 1414    Visit Number  12    Number of Visits  17    Date for [name] Re-Evaluation  10/06/19    Authorization Type  medicare and AARP supplement    [name] Start Time  1408    [name] Stop Time  1448    [name] Time Calculation (min)  40 min    Activity Tolerance  Patient tolerated treatment well    Behavior During Therapy  WFL for tasks assessed/performed       Past Medical History:  Diagnosis Date  . (HFpEF) heart failure with preserved ejection fraction ([place])    a. 05/2013 Echo: EF 55%, mild LVH, diast dysfxn, Ao sclerosis, mildly dil LA, RV dysfxn (poorly visualized), PASP 54mHg;  b. 03/2017 Echo: EF 55-60%, no rwma, triv MR, mildly dil RV, mod TR, PASP 451mg.  . [facility]   s/p Cox Maze 1/09;  Multaq Rx d/c'd in 2014 due to pulmo fibrosis;  coumadin d/c'd in 2014 due to spontaneous subdural hematoma  . BPH (benign prostatic hyperplasia)   . CAD (coronary artery disease), native coronary artery    a. s/p CABG 12/2007;  b. Myoview 12/2011: EF 66%, no scar or ischemia; normal.  . DM (diabetes mellitus) ([facility] [place]  . Hyperlipidemia type II   . Hypertension   . 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 OSA (obstructive sleep apnea)   . Pacemaker    PPM - St. Jude  . Peripheral neuropathy 07/31/2018  . Pulmonary fibrosis ([facility] [place]   Multaq d/c'd 7/14  . 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 AMPUTATION Left 05/17/2019   Procedure: AMPUTATION LEFT FOURTH TOE;   Surgeon: DeMeredith PelMD;  [facility]: [facility];  Laterality: Left;  . APPENDECTOMY    . CHOLECYSTECTOMY    . CORONARY ARTERY BYPASS GRAFT     x3 (left internal mammary artery to distal left anterior descending coronary artery, saphenous vain graft to second circumflex marginal branch, saphenous vain graft to posterior descending coronary artery, endoscopic saphenous vain harvest from right thigh) and modified Cox - Maze IV procedure.  ClValentina GuOwen,MD. Electronically signed CHO/MEDQ D: 01/09/2008/ JOB: [id number]:  JoIran SizerD  . CRKyla Balzarine8/13/2013   Procedure: CRANIOTOMY HEMATOMA EVACUATION SUBDURAL;  Surgeon: GaElaina HoopsMD;  [facility];  Service: Neurosurgery;  Laterality: Right;  Right craniotomy for evacuation of subdural hematoma  . FOOT SURGERY    . HERNIA REPAIR    . ORCHIECTOMY     Left  /  testicular cancer  . PACEMAKER PLACEMENT     PPM - St. Jude  . PPM GENERATOR CHANGEOUT N/A 06/25/2019   Procedure: PPM GENERATOR CHANGEOUT;  Surgeon: TaEvans LanceMD;  [facility];  Service: Cardiovascular;  Laterality: N/A;    There were no vitals filed for this visit.  Subjective Assessment - 09/22/19 1407    Subjective  The patient reports he took the  paper off of his walker to come out of the house.  The patient went to a concert at his church yesterday.  He does not recall any falls since last session in therapy.    Pertinent History  Lewy body dementia, debility, CHF, diabetes, CABG.    Patient Stated Goals  The patient was not using the walker before the stroke.    Currently in Pain?  Yes   noted today that he sees a chiropractor tomorrow b/c of aging and pain in his neck, back, and shoulders.        [facility] [name] Assessment - 09/22/19 1418      Berg Balance Test   Sit to Stand  Able to stand without using hands and stabilize independently    Standing Unsupported  Able to stand safely 2 minutes    Sitting with Back  Unsupported but Feet Supported on Floor or Stool  Able to sit safely and securely 2 minutes    Stand to Sit  Sits safely with minimal use of hands    Transfers  Able to transfer safely, minor use of hands    Standing Unsupported with Eyes Closed  Able to stand 10 seconds with supervision    Standing Unsupported with Feet Together  Able to place feet together independently and stand 1 minute safely    From Standing, Reach Forward with Outstretched Arm  Can reach forward >12 cm safely (5")    From Standing Position, Pick up Object from Floor  Able to pick up shoe, needs supervision    From Standing Position, Turn to Look Behind Over each Shoulder  Looks behind from both sides and weight shifts well    Turn 360 Degrees  Able to turn 360 degrees safely but slowly    Standing Unsupported, Alternately Place Feet on Step/Stool  Able to complete 4 steps without aid or supervision    Standing Unsupported, One Foot in Front  Able to plae foot ahead of the other independently and hold 30 seconds    Standing on One Leg  Tries to lift leg/unable to hold 3 seconds but remains standing independently    Total Score  45    Berg comment:  45/56 improved from 35/56                   [facility] Adult [name] Treatment/Exercise - 09/22/19 1418      Ambulation/Gait   Ambulation/Gait  Yes    Ambulation/Gait Assistance  5: Supervision    Ambulation Distance (Feet)  240 Feet   450 feet, 130 ft x 2 reps   Assistive device  4-wheeled walker    Gait Pattern  Step-through pattern    Ambulation Surface  Level;Indoor    Gait Comments  The patient negotiated tight spaces with rollator RW and was able to clear obstacles without difficulty.        Neuro Re-ed    Neuro Re-ed Details   Reviewed countertop exercise of marching, stepping forward/return to midline.        Exercises   Exercises  Other Exercises    Other Exercises   Heel and toe raises x 10 reps each and gastoc stretch standing.                [name] Short Term Goals - 09/18/19 2237      [name] SHORT TERM GOAL #1   Title  The patient will perform HEP with wife's assist emphasizing LE strength, balance, general mobility.  Baseline  The patient's wife does not assist at home; patient reports he does occasionally, however not consistent with his response.    Time  4    Period  Weeks    Status  Not Met    Target Date  09/06/19      [name] SHORT TERM GOAL #2   Title  The patient will improve Berg balance score from 35/56 to > or equal to 40/56 to demo dec'ing risk for falls.    Baseline  43/56    Time  4    Period  Weeks    Status  Achieved    Target Date  09/06/19      [name] SHORT TERM GOAL #3   Title  The patient will improve gait speed from 1.47 ft/sec to > or equal to 2.0 ft/sec with rollator RW to demo improving functional mobility and dec'd risk for falls.    Baseline  2.3 ft/sec with rollator RW on 09/04/2019    Time  4    Period  Weeks    Status  Achieved    Target Date  09/06/19      [name] SHORT TERM GOAL #4   Title  The patient will demo floor<>stand with UE support and supervision due to h/o frequent falls.    Baseline  [name] demonstrated and the patient performed with supervision.  *Upon standing on mat, the patient needed CGA due to more compliant surface.    Time  4    Period  Weeks    Status  Achieved    Target Date  09/06/19      [name] SHORT TERM GOAL #5   Title  The patient's wife will report improved ability to rise from couch surfaces in the home.    Baseline  variable performance but relays overall better    Time  4    Period  Weeks    Status  Achieved    Target Date  09/06/19        [name] Long Term Goals - 09/22/19 1435      [name] LONG TERM GOAL #1   Title  The patient will be able to perform HEP progression with wife's assistance.    Time  8    Period  Weeks    Target Date  10/06/19      [name] LONG TERM GOAL #2   Title  The patient will perform stair negotiation with reciprocal pattern + one  rail x 4 steps mod indep.    Time  8    Period  Weeks      [name] LONG TERM GOAL #3   Title  The patient will be able to ambulate in clinic without a device x 200 ft with supervision without loss of balance (as he forgets his device at home).    Baseline  Have encouraged the patient to ambulate with rollator at all times.    Time  8    Period  Weeks    Status  Deferred      [name] LONG TERM GOAL #4   Title  The patient will improve Berg from 35/56 to > or equal to 44/56 to demo dec'ing risk for falls.    Baseline  Improved to 45/56 however continues with high fall risk due to dec'd attention and cognitive changes.    Time  8    Period  Weeks    Status  Achieved  Plan - 09/22/19 1453    Clinical Impression Statement  The patient met LTG for Berg and demonstrated ability to perform HEP with cues on which ones to perform and safety to hold countertop for support.  He is scheduled for 1 more visit.  The patient discussed with patient's wife d/c after next visit continuing to focus on consistency in the home with rollator use (he has not fallen since last session with emphasis on continuous rollator use) and continuing HEP.    [name] Treatment/Interventions  ADLs/Self Care Home Management;Neuromuscular re-education;Patient/family education;Gait training;Stair training;Functional mobility training;Therapeutic activities;Therapeutic exercise;Balance training;DME Instruction    [name] Next Visit Plan  check LTGs, review HEP, community gait with rollator.    Consulted and Agree with Plan of Care  Patient;Family member/caregiver    Family Member Consulted  walked patient to car at end of session to speak with his wife       Patient will benefit from skilled therapeutic intervention in order to improve the following deficits and impairments:  Abnormal gait, Decreased activity tolerance, Decreased balance, Decreased strength, Decreased mobility, Decreased safety awareness, Decreased  coordination  Visit Diagnosis: Unsteadiness on feet  Other abnormalities of gait and mobility  Muscle weakness (generalized)     Problem List Patient Active Problem List   Diagnosis Date Noted  . Acute on chronic diastolic heart failure ([place]) 06/03/2019  . Coronary artery disease involving native heart without angina pectoris 06/03/2019  . Debility 05/26/2019  . 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 Toe infection 05/17/2019  . DOE (dyspnea on exertion) 05/15/2019  . Severe tricuspid regurgitation 05/15/2019  . Acute on chronic right-sided heart failure ([place]) 05/15/2019  . Cellulitis of fourth toe of left foot   . Excessive daytime sleepiness 02/11/2019  . 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[place]) 02/11/2019  . Iron deficiency anemia 02/04/2019  . Poor memory 12/16/2018  . Deficiency anemia 11/19/2018  . Other fatigue 11/19/2018  . History of elevated PSA 11/19/2018  . Prostate cancer screening 11/19/2018  . Prostate CA ([place]) 11/19/2018  . Pancytopenia, acquired ([place]) 08/13/2018  . Recurrent falls 08/13/2018  . 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 Peripheral neuropathy 07/31/2018  . 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 Anemia, chronic disease 01/28/2018  . History of skin cancer 07/27/2017  . Fatty liver disease, nonalcoholic 59/97/7414  . Thrombocytopenia ([place]) 11/27/2016  . Lung nodule 04/23/2014  . Postinflammatory pulmonary fibrosis ([place]) 06/13/2013  . Long term (current) use of anticoagulants 11/22/2011  . Abdominal bruit 03/07/2011  . Pacemaker-St.Jude   . Hx of CABG   . Hyperlipidemia type II   . OSA (obstructive sleep apnea)   . PACEMAKER, PERMANENT 08/17/2010  . HYPERLIPIDEMIA 08/16/2010  . Obesity 08/16/2010  . Essential hypertension 08/16/2010  . Atrial fibrillation ([place]) 08/16/2010  . IRREGULAR HEART RATE 08/16/2010  . Sleep apnea 08/16/2010  . BENIGN PROSTATIC HYPERTROPHY,  HX OF 08/16/2010    [name], [name] 09/22/2019, 2:56 PM  [facility] [address] [place], [place], [address] Phone: [phone]   Fax:  [phone]  Name: [name]. MRN: [id number] Date of Birth: March 02, 1941

## 2019-09-23 DIAGNOSIS — M5431 Sciatica, right side: Secondary | ICD-10-CM | POA: Diagnosis not present

## 2019-09-23 DIAGNOSIS — M9903 Segmental and somatic dysfunction of lumbar region: Secondary | ICD-10-CM | POA: Diagnosis not present

## 2019-09-23 DIAGNOSIS — M9901 Segmental and somatic dysfunction of cervical region: Secondary | ICD-10-CM | POA: Diagnosis not present

## 2019-09-23 DIAGNOSIS — M5432 Sciatica, left side: Secondary | ICD-10-CM | POA: Diagnosis not present

## 2019-09-24 ENCOUNTER — Encounter: Payer: Self-pay | Admitting: Internal Medicine

## 2019-09-24 ENCOUNTER — Ambulatory Visit (INDEPENDENT_AMBULATORY_CARE_PROVIDER_SITE_OTHER): Payer: Medicare Other | Admitting: Internal Medicine

## 2019-09-24 ENCOUNTER — Other Ambulatory Visit: Payer: Self-pay

## 2019-09-24 ENCOUNTER — Ambulatory Visit: Payer: Medicare Other | Admitting: Adult Health

## 2019-09-24 VITALS — BP 114/66 | HR 60 | Ht 68.0 in | Wt 180.0 lb

## 2019-09-24 DIAGNOSIS — I4821 Permanent atrial fibrillation: Secondary | ICD-10-CM | POA: Diagnosis not present

## 2019-09-24 DIAGNOSIS — I6330 Cerebral infarction due to thrombosis of unspecified cerebral artery: Secondary | ICD-10-CM

## 2019-09-24 DIAGNOSIS — R001 Bradycardia, unspecified: Secondary | ICD-10-CM | POA: Diagnosis not present

## 2019-09-24 DIAGNOSIS — I480 Paroxysmal atrial fibrillation: Secondary | ICD-10-CM

## 2019-09-24 DIAGNOSIS — Z950 Presence of cardiac pacemaker: Secondary | ICD-10-CM | POA: Diagnosis not present

## 2019-09-24 LAB — CUP PACEART INCLINIC DEVICE CHECK
Battery Remaining Longevity: 121 mo
Battery Voltage: 3.02 V
Brady Statistic RA Percent Paced: 67 %
Brady Statistic RV Percent Paced: 23 %
Date Time Interrogation Session: 20201007145008
Implantable Lead Implant Date: 20090420
Implantable Lead Implant Date: 20090420
Implantable Lead Location: 753859
Implantable Lead Location: 753860
Implantable Pulse Generator Implant Date: 20200708
Lead Channel Impedance Value: 400 Ohm
Lead Channel Impedance Value: 550 Ohm
Lead Channel Pacing Threshold Amplitude: 0.75 V
Lead Channel Pacing Threshold Amplitude: 0.75 V
Lead Channel Pacing Threshold Amplitude: 1 V
Lead Channel Pacing Threshold Amplitude: 1 V
Lead Channel Pacing Threshold Pulse Width: 0.4 ms
Lead Channel Pacing Threshold Pulse Width: 0.4 ms
Lead Channel Pacing Threshold Pulse Width: 0.4 ms
Lead Channel Pacing Threshold Pulse Width: 0.4 ms
Lead Channel Sensing Intrinsic Amplitude: 1.5 mV
Lead Channel Sensing Intrinsic Amplitude: 6.8 mV
Lead Channel Setting Pacing Amplitude: 0.875
Lead Channel Setting Pacing Amplitude: 2 V
Lead Channel Setting Pacing Pulse Width: 0.4 ms
Lead Channel Setting Sensing Sensitivity: 2 mV
Pulse Gen Model: 2272
Pulse Gen Serial Number: 9149180

## 2019-09-24 NOTE — Progress Notes (Deleted)
Cardiology Office Note   Date:  09/24/2019   ID:  Jamie Model., DOB 06/25/1941, MRN [id number]  PCP:  [name], [name]  Cardiologist:   No chief complaint on file.    History of Present Illness:    [name]. is a 78 y.o. male who presents for ongoing assessment and management of coronary artery disease with history of CABG (LIMA to LAD, SVG to OM 2, SVG to PDA, additionally with modified Cox maze IV procedure in January 2009.  Other history includes atrial fibrillation, pacemaker implantation for sinus node dysfunction and symptomatic bradycardia.    The patient was taken off of Coumadin in the setting of spontaneous subdural hematoma and August 2013 requiring a craniotomy and evacuation by Dr. Saintclair [name].  He is no longer to be considered an anticoagulation candidate.  He was intolerant to Multaq, as it was felt that it was causing pulmonary toxicity.  CT scan in August 2020 did show mild to moderate pulmonary fibrosis.Other history includes anemia, cirrhosis of the liver, and pulmonary fibrosis followed by pulmonary.  He was noted not to be a surgical candidate for tricuspid valve repair in the setting of severe tricuspid regurgitation as it was felt it was secondary to prior pacemaker versus right ventricular enlargement related to pulmonary hypertension.  On last office visit the patient has had 3 falls since being seen by any provider.  As result of this I decrease torsemide back down to 20 mg twice daily as he was found to be hypotensive and we decreased his Flomax to 0.02 mg from 0.4 mg daily.  The patient may take an extra dose of torsemide if he begins to gain weight or have evidence of volume overload.  He continues to work with physical therapy in his home for strengthening.  He is here for follow-up to evaluate his response to medication changes.  He presents to the clinic today and states  He denies chest pain, shortness of breath, lower extremity edema, fatigue,  palpitations, melena, hematuria, hemoptysis, diaphoresis, weakness, presyncope, syncope, orthopnea, and PND.   Past Medical History:  Diagnosis Date  . (HFpEF) heart failure with preserved ejection fraction ([place])    a. 05/2013 Echo: EF 55%, mild LVH, diast dysfxn, Ao sclerosis, mildly dil LA, RV dysfxn (poorly visualized), PASP 10mmHg;  b. 03/2017 Echo: EF 55-60%, no rwma, triv MR, mildly dil RV, mod TR, PASP 43mmHg.  . Atrial fibrillation [facility])    s/p Cox Maze 1/09;  Multaq Rx d/c'd in 2014 due to pulmo fibrosis;  coumadin d/c'd in 2014 due to spontaneous subdural hematoma  . BPH (benign prostatic hyperplasia)   . CAD (coronary artery disease), native coronary artery    a. s/p CABG 12/2007;  b. Myoview 12/2011: EF 66%, no scar or ischemia; normal.  . DM (diabetes mellitus) ([place])   . Hyperlipidemia type II   . Hypertension   . 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 OSA (obstructive sleep apnea)   . Pacemaker    PPM - St. Jude  . Peripheral neuropathy 07/31/2018  . Pulmonary fibrosis ([place])    Multaq d/c'd 7/14  . Subdural hematoma ([place]) 07/2012   spontaneous;  coumadin d/c'd => no longer a candidate for anticoagulation    Past Surgical History:  Procedure Laterality Date  . AMPUTATION Left 05/17/2019   Procedure: AMPUTATION LEFT FOURTH TOE;  Surgeon: [name], [name];  Location: [facility];  Service: Orthopedics;  Laterality: Left;  .  APPENDECTOMY    . CHOLECYSTECTOMY    . CORONARY ARTERY BYPASS GRAFT     x3 (left internal mammary artery to distal left anterior descending coronary artery, saphenous vain graft to second circumflex marginal branch, saphenous vain graft to posterior descending coronary artery, endoscopic saphenous vain harvest from right thigh) and modified Cox - Maze IV procedure.  Jamie Gu. [name],[name]. Electronically signed CHO/MEDQ D: 01/09/2008/ JOB: [id number] cc:  [name]  . [name]  07/30/2012   Procedure: CRANIOTOMY  HEMATOMA EVACUATION SUBDURAL;  Surgeon: [name], [name];  Location: [facility];  Service: Neurosurgery;  Laterality: Right;  Right craniotomy for evacuation of subdural hematoma  . FOOT SURGERY    . HERNIA REPAIR    . ORCHIECTOMY     Left  /  testicular cancer  . PACEMAKER PLACEMENT     PPM - St. Jude  . PPM GENERATOR CHANGEOUT N/A 06/25/2019   Procedure: PPM GENERATOR CHANGEOUT;  Surgeon: [name], [name];  Location: [facility];  Service: Cardiovascular;  Laterality: N/A;     Current Outpatient Medications  Medication Sig Dispense Refill  . acetaminophen (TYLENOL) 325 MG tablet Take 1-2 tablets (325-650 mg total) by mouth every 4 (four) hours as needed for mild pain. (Patient taking differently: Take 325-650 mg by mouth as needed for mild pain. )    . allopurinol (ZYLOPRIM) 300 MG tablet Take 300 mg by mouth daily.     [name] aspirin 81 MG EC tablet Take 1 tablet (81 mg total) by mouth daily. (Patient taking differently: Take 81 mg by mouth daily. ON HOLD)  0  . donepezil (ARICEPT) 10 MG tablet Take 1 tablet (10 mg total) by mouth at bedtime. 30 tablet 11  . ferrous sulfate 325 (65 FE) MG tablet Take 1 tablet (325 mg total) by mouth daily with breakfast. 30 tablet 1  . gabapentin (NEURONTIN) 300 MG capsule TAKE 1 CAPSULE BY MOUTH THREE TIMES A DAY 90 capsule 1  . metoprolol tartrate (LOPRESSOR) 50 MG tablet Take 1 tablet (50 mg total) by mouth 2 (two) times daily. 60 tablet 1  . MYRBETRIQ 50 MG TB24 tablet TAKE 1 TABLET BY MOUTH EVERY DAY (Patient taking differently: Take 50 mg by mouth daily. ) 30 tablet 3  . neomycin-bacitracin-polymyxin (NEOSPORIN) ointment Apply 1 application topically daily as needed for wound care.     . pantoprazole (PROTONIX) 40 MG tablet Take 40 mg by mouth daily.    . potassium chloride SA (K-DUR) 20 MEQ tablet Take 1 tablet (20 mEq total) by mouth daily. 30 tablet 1  . rosuvastatin (CRESTOR) 40 MG tablet Take 1 tablet (40 mg total) by mouth daily. (Patient  taking differently: Take 40 mg by mouth every evening. ) 90 tablet 2  . sertraline (ZOLOFT) 100 MG tablet Take 200 mg by mouth daily.     . tamsulosin (FLOMAX) 0.4 MG CAPS capsule Take 0.4 mg by mouth at bedtime.     . torsemide (DEMADEX) 20 MG tablet Take 1 tablet (20 mg total) by mouth 2 (two) times daily. 180 tablet 3   Current Facility-Administered Medications  Medication Dose Route Frequency Provider Last Rate Last Dose  . mupirocin ointment (BACTROBAN) 2 %   Topical BID [name], [name], [name]        Allergies:   Lipitor [atorvastatin], Nsaids, Warfarin and related, and Enbrel [etanercept]    Social History:  The patient  reports that he quit smoking about 28 years ago.  His smoking use included cigarettes. He has a 40.00 pack-year smoking history. He has never used smokeless tobacco. He reports that he does not drink alcohol or use drugs.   Family History:  The patient's family history includes Alzheimer's disease in his mother; Dementia in his mother; Heart attack in his father; Heart disease in his father and mother; Heart failure in his father.    ROS: All other systems are reviewed and negative. Unless otherwise mentioned in H&P    PHYSICAL EXAM: VS:  There were no vitals taken for this visit. , BMI There is no height or weight on file to calculate BMI. GEN: Well nourished, well developed, in no acute distress HEENT: normal Neck: no JVD, carotid bruits, or masses Cardiac: ***RRR; no murmurs, rubs, or gallops,no edema  Respiratory:  Clear to auscultation bilaterally, normal work of breathing GI: soft, nontender, nondistended, + BS MS: no deformity or atrophy Skin: warm and dry, no rash Neuro:  Strength and sensation are intact Psych: euthymic mood, full affect   EKG:  EKG {ACTION; IS/IS [id number] ordered today. The ekg ordered today demonstrates ***  EKG 09/11/2019 Atrial fibrillation slow ventricular response right bundle branch block 60 bpm   Recent Labs:  05/16/2019: B Natriuretic Peptide 289.1; TSH 2.022 05/27/2019: ALT 23 09/08/2019: BUN 30; Creatinine, Ser 1.64; Potassium 4.3; Sodium 146 09/09/2019: Hemoglobin 9.4; Magnesium 2.3; Platelets 82    Lipid Panel    Component Value Date/Time   CHOL 103 05/22/2019 0338   CHOL 119 09/24/2017 0824   TRIG 58 05/22/2019 0338   HDL 40 ([name]) 05/22/2019 0338   HDL 42 09/24/2017 0824   CHOLHDL 2.6 05/22/2019 0338   VLDL 12 05/22/2019 0338   LDLCALC 51 05/22/2019 0338   LDLCALC 58 09/24/2017 0824   LDLDIRECT 154.2 10/20/2013 0846      Wt Readings from Last 3 Encounters:  09/09/19 180 lb 9.6 oz (81.9 kg)  08/29/19 186 lb 3.2 oz (84.5 kg)  08/18/19 187 lb (84.8 kg)      Other studies Reviewed: Echocardiogram 2019-05-20 1. The left ventricle has low normal systolic function, with an ejection fraction of 50-55%. The cavity size was normal. Left ventricular diastolic Doppler parameters are indeterminate. 2. LVEF is approximately 50 to 55% with septal hypokinesis. Poor. 3. The right ventricle was not well visualized. The cavity was moderately enlarged. There is no increase in right ventricular wall thickness. 4. RV is difficult to visualize, even with Definity It appears dilated and function is at least mildly reduced. 5. The mitral valve is abnormal. Mild thickening of the mitral valve leaflet. 6. The tricuspid valve is grossly normal. Tricuspid valve regurgitation is severe. 7. The aortic valve was not well visualized. Mild thickening of the aortic valve. Mild calcification of the aortic valve. 8. The inferior vena cava was dilated in size with <50% respiratory variability. 9. The interatrial septum was not assessed.  ASSESSMENT AND PLAN:  Chronic CHF- weight today***from 180.6 pounds  Atrial fibrillation-his heart rate today is***  Frequent falls-his Eliquis and aspirin have been discontinued due to his history of frequent falls.  Hypotension- blood pressure today ***during previous  visit on 09/09/2019 His blood pressure was 92/52 with a heart rate of 60.  His torsemide was decreased to 20 mg twice daily.  Disposition: Follow-up with Dr. [name] in 2 months.  Current medicines are reviewed at length with the patient today.    Labs/ tests ordered today include: *** Coletta Memos NP-C   09/24/2019 7:41 AM  [facility] [phone] Fax [phone]  Notice: This dictation was prepared with Dragon dictation along with smaller phrase technology. Any transcriptional errors that result from this process are unintentional and may not be corrected upon review.

## 2019-09-24 NOTE — Progress Notes (Signed)
HPI Mr. Bassil returns today for followup. He is a pleasant 78 yo man with a h/o sinus node dysfunction, s/p PPM insertion. He has HTN. He feels well. He denies chest pain.  Allergies  Allergen Reactions   Lipitor [Atorvastatin] Other (See Comments)    Stiff joints   Nsaids Other (See Comments)    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   Warfarin And Related Other (See Comments)    Stiff joints   Enbrel [Etanercept] Itching     Current Outpatient Medications  Medication Sig Dispense Refill   acetaminophen (TYLENOL) 325 MG tablet Take 1-2 tablets (325-650 mg total) by mouth every 4 (four) hours as needed for mild pain. (Patient taking differently: Take 325-650 mg by mouth as needed for mild pain. )     allopurinol (ZYLOPRIM) 300 MG tablet Take 300 mg by mouth daily.      aspirin 81 MG EC tablet Take 1 tablet (81 mg total) by mouth daily. (Patient taking differently: Take 81 mg by mouth daily. ON HOLD)  0   donepezil (ARICEPT) 10 MG tablet Take 1 tablet (10 mg total) by mouth at bedtime. 30 tablet 11   ferrous sulfate 325 (65 FE) MG tablet Take 1 tablet (325 mg total) by mouth daily with breakfast. 30 tablet 1   gabapentin (NEURONTIN) 300 MG capsule TAKE 1 CAPSULE BY MOUTH THREE TIMES A DAY 90 capsule 1   metoprolol tartrate (LOPRESSOR) 50 MG tablet Take 1 tablet (50 mg total) by mouth 2 (two) times daily. 60 tablet 1   MYRBETRIQ 50 MG TB24 tablet TAKE 1 TABLET BY MOUTH EVERY DAY (Patient taking differently: Take 50 mg by mouth daily. ) 30 tablet 3   neomycin-bacitracin-polymyxin (NEOSPORIN) ointment Apply 1 application topically daily as needed for wound care.      pantoprazole (PROTONIX) 40 MG tablet Take 40 mg by mouth daily.     potassium chloride SA (K-DUR) 20 MEQ tablet Take 1 tablet (20 mEq total) by mouth daily. 30 tablet 1   rosuvastatin (CRESTOR) 40 MG tablet Take 1 tablet (40 mg total) by mouth daily. (Patient taking differently: Take 40 mg by mouth every evening. )  90 tablet 2   sertraline (ZOLOFT) 100 MG tablet Take 200 mg by mouth daily.      tamsulosin (FLOMAX) 0.4 MG CAPS capsule Take 0.4 mg by mouth at bedtime.      torsemide (DEMADEX) 20 MG tablet Take 1 tablet (20 mg total) by mouth 2 (two) times daily. 180 tablet 3   Current Facility-Administered Medications  Medication Dose Route Frequency Provider Last Rate Last Dose   mupirocin ointment (BACTROBAN) 2 %   Topical BID Magnant, Gerrianne Scale, PA-C         Past Medical History:  Diagnosis Date   (HFpEF) heart failure with preserved ejection fraction ([place])    a. 05/2013 Echo: EF 55%, mild LVH, diast dysfxn, Ao sclerosis, mildly dil LA, RV dysfxn (poorly visualized), PASP 65mmHg;  b. 03/2017 Echo: EF 55-60%, no rwma, triv MR, mildly dil RV, mod TR, PASP 58mmHg.   Atrial fibrillation ([place])    s/p Cox Maze 1/09;  Multaq Rx d/c'd in 2014 due to pulmo fibrosis;  coumadin d/c'd in 2014 due to spontaneous subdural hematoma   BPH (benign prostatic hyperplasia)    CAD (coronary artery disease), native coronary artery    a. s/p CABG 12/2007;  b. Myoview 12/2011: EF 66%, no scar or ischemia; normal.   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 Jude   Peripheral neuropathy 07/31/2018   Pulmonary fibrosis ([place])    Multaq d/c'd 7/14   Subdural hematoma ([place]) 07/2012   spontaneous;  coumadin d/c'd => no longer a candidate for anticoagulation    ROS:   All systems reviewed and negative except as noted in the HPI.   Past Surgical History:  Procedure Laterality Date   AMPUTATION Left 05/17/2019   Procedure: AMPUTATION LEFT FOURTH TOE;  Surgeon: Meredith Pel, MD;  Location: [facility];  Service: Orthopedics;  Laterality: Left;   APPENDECTOMY     CHOLECYSTECTOMY     CORONARY ARTERY BYPASS GRAFT     x3 (left internal mammary  artery to distal left anterior descending coronary artery, saphenous vain graft to second circumflex marginal branch, saphenous vain graft to posterior descending coronary artery, endoscopic saphenous vain harvest from right thigh) and modified Cox - Maze IV procedure.  [name],MD. Electronically signed CHO/MEDQ D: 01/09/2008/ JOB: [id number] cc:  [name]   CRANIOTOMY  07/30/2012   Procedure: CRANIOTOMY HEMATOMA EVACUATION SUBDURAL;  Surgeon: [name], MD;  Location: [facility];  Service: Neurosurgery;  Laterality: Right;  Right craniotomy for evacuation of subdural hematoma   FOOT SURGERY     HERNIA REPAIR     ORCHIECTOMY     Left  /  testicular cancer   PACEMAKER PLACEMENT     PPM - St. Jude   PPM GENERATOR CHANGEOUT N/A 06/25/2019   Procedure: PPM GENERATOR CHANGEOUT;  Surgeon: Evans Lance, MD;  Location: [facility];  Service: Cardiovascular;  Laterality: N/A;     Family History  Problem Relation Age of Onset   Heart disease Father    Heart attack Father    Heart failure Father    Heart disease Mother    Alzheimer's disease Mother    Dementia Mother      Social History   Socioeconomic History   Marital status: Married    Spouse name: Immunologist   Number of children: 2   Years of education: Not on file   Highest education level: Not on file  Occupational History   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: Not on file   Food insecurity    Worry: Not on file    Inability: Not on Lexicographer needs    Medical: Not on file    Non-medical: Not on file  Tobacco Use   Smoking status: Former Smoker    Packs/day: 2.00    Years: 20.00    Pack years: 40.00    Types: Cigarettes    Quit date: 02/21/1991    Years since quitting: 28.6   Smokeless tobacco: Never Used  Substance and Sexual Activity   Alcohol use: No    Alcohol/week: 0.0 standard drinks   Drug use: No   Sexual activity:  Not Currently  Lifestyle   Physical activity    Days per week: Not on file    Minutes per session: Not on file   Stress: Not on file  Relationships   Social connections    Talks on phone: Not on file    Gets together: Not on file    Attends religious service:  Not on file    Active member of club or organization: Not on file    Attends meetings of clubs or organizations: Not on file    Relationship status: Not on file   Intimate partner violence    Fear of current or ex partner: Not on file    Emotionally abused: Not on file    Physically abused: Not on file    Forced sexual activity: Not on file  Other Topics Concern   Not on file  Social History Narrative   Lives with wife   Right handed    Married with two children.     He is a Engineer, structural.       BP 114/66    Pulse 60    Ht 5\' 8"  (1.727 m)    Wt 180 lb (81.6 kg)    SpO2 99%    BMI 27.37 kg/m   Physical Exam:  Well appearing NAD HEENT: Unremarkable Neck:  No JVD, no thyromegally Lymphatics:  No adenopathy Back:  No CVA tenderness Lungs:  Clear HEART:  Regular rate rhythm, no murmurs, no rubs, no clicks Abd:  soft, positive bowel sounds, no organomegally, no rebound, no guarding Ext:  2 plus pulses, no edema, no cyanosis, no clubbing Skin:  No rashes no nodules Neuro:  CN II through XII intact, motor grossly intact   DEVICE  Normal device function.  See PaceArt for details.   Assess/Plan: 1. SVT - he has episodes of atrial tachycardia at 110/min. He is asymptomatic and will undergo watchful waiting.  2. PPM - his St. Jude DDD PM is working normally. We will recheck in several months. 3. Sinus node dysfunction - he is asymptomatic, s/p PPM insertion.   [name].D.

## 2019-09-24 NOTE — Patient Instructions (Addendum)
Medication Instructions:  Your physician recommends that you continue on your current medications as directed. Please refer to the Current Medication list given to you today.  Labwork: None ordered.  Testing/Procedures: None ordered.  Follow-Up: Your physician wants you to follow-up in: 1 year with Dr. Lovena Le.   You will receive a reminder letter in the mail two months in advance. If you don't receive a letter, please call our office to schedule the follow-up appointment.  Remote monitoring is used to monitor your Pacemaker from home. This monitoring reduces the number of office visits required to check your device to one time per year. It allows [place] to keep an eye on the functioning of your device to ensure it is working properly. You are scheduled for a device check from home on 12/24/2019. You may send your transmission at any time that day. If you have a wireless device, the transmission will be sent automatically. After your physician reviews your transmission, you will receive a postcard with your next transmission date.  Any Other Special Instructions Will Be Listed Below (If Applicable).  If you need a refill on your cardiac medications before your next appointment, please call your pharmacy.

## 2019-09-25 ENCOUNTER — Inpatient Hospital Stay: Payer: Medicare Other

## 2019-09-25 ENCOUNTER — Encounter: Payer: Self-pay | Admitting: Rehabilitative and Restorative Service Providers"

## 2019-09-25 ENCOUNTER — Ambulatory Visit: Payer: Medicare Other | Admitting: Rehabilitative and Restorative Service Providers"

## 2019-09-25 ENCOUNTER — Other Ambulatory Visit: Payer: Self-pay

## 2019-09-25 ENCOUNTER — Encounter: Payer: Self-pay | Admitting: Hematology and Oncology

## 2019-09-25 ENCOUNTER — Other Ambulatory Visit: Payer: Self-pay | Admitting: Hematology and Oncology

## 2019-09-25 ENCOUNTER — Inpatient Hospital Stay: Payer: Medicare Other | Attending: Hematology and Oncology | Admitting: Hematology and Oncology

## 2019-09-25 DIAGNOSIS — D61818 Other pancytopenia: Secondary | ICD-10-CM | POA: Diagnosis not present

## 2019-09-25 DIAGNOSIS — K7460 Unspecified cirrhosis of liver: Secondary | ICD-10-CM | POA: Diagnosis not present

## 2019-09-25 DIAGNOSIS — M6281 Muscle weakness (generalized): Secondary | ICD-10-CM

## 2019-09-25 DIAGNOSIS — Z79899 Other long term (current) drug therapy: Secondary | ICD-10-CM | POA: Insufficient documentation

## 2019-09-25 DIAGNOSIS — D638 Anemia in other chronic diseases classified elsewhere: Secondary | ICD-10-CM | POA: Diagnosis not present

## 2019-09-25 DIAGNOSIS — R2689 Other abnormalities of gait and mobility: Secondary | ICD-10-CM | POA: Diagnosis not present

## 2019-09-25 DIAGNOSIS — D472 Monoclonal gammopathy: Secondary | ICD-10-CM | POA: Insufficient documentation

## 2019-09-25 DIAGNOSIS — R2681 Unsteadiness on feet: Secondary | ICD-10-CM

## 2019-09-25 DIAGNOSIS — R161 Splenomegaly, not elsewhere classified: Secondary | ICD-10-CM | POA: Insufficient documentation

## 2019-09-25 DIAGNOSIS — D539 Nutritional anemia, unspecified: Secondary | ICD-10-CM

## 2019-09-25 DIAGNOSIS — D509 Iron deficiency anemia, unspecified: Secondary | ICD-10-CM | POA: Diagnosis not present

## 2019-09-25 DIAGNOSIS — I6330 Cerebral infarction due to thrombosis of unspecified cerebral artery: Secondary | ICD-10-CM

## 2019-09-25 LAB — CBC WITH DIFFERENTIAL/PLATELET
Abs Immature Granulocytes: 0.01 10*3/uL (ref 0.00–0.07)
Basophils Absolute: 0 10*3/uL (ref 0.0–0.1)
Basophils Relative: 1 %
Eosinophils Absolute: 0.1 10*3/uL (ref 0.0–0.5)
Eosinophils Relative: 2 %
HCT: 28.2 % — ABNORMAL LOW (ref 39.0–52.0)
Hemoglobin: 9.2 g/dL — ABNORMAL LOW (ref 13.0–17.0)
Immature Granulocytes: 0 %
Lymphocytes Relative: 22 %
Lymphs Abs: 0.7 10*3/uL (ref 0.7–4.0)
MCH: 31.5 pg (ref 26.0–34.0)
MCHC: 32.6 g/dL (ref 30.0–36.0)
MCV: 96.6 fL (ref 80.0–100.0)
Monocytes Absolute: 0.2 10*3/uL (ref 0.1–1.0)
Monocytes Relative: 5 %
Neutro Abs: 2.3 10*3/uL (ref 1.7–7.7)
Neutrophils Relative %: 70 %
Platelets: 88 10*3/uL — ABNORMAL LOW (ref 150–400)
RBC: 2.92 MIL/uL — ABNORMAL LOW (ref 4.22–5.81)
RDW: 15.9 % — ABNORMAL HIGH (ref 11.5–15.5)
WBC: 3.3 10*3/uL — ABNORMAL LOW (ref 4.0–10.5)
nRBC: 0 % (ref 0.0–0.2)

## 2019-09-25 LAB — COMPREHENSIVE METABOLIC PANEL
ALT: 20 U/L (ref 0–44)
AST: 31 U/L (ref 15–41)
Albumin: 3.8 g/dL (ref 3.5–5.0)
Alkaline Phosphatase: 113 U/L (ref 38–126)
Anion gap: 8 (ref 5–15)
BUN: 20 mg/dL (ref 8–23)
CO2: 25 mmol/L (ref 22–32)
Calcium: 8.7 mg/dL — ABNORMAL LOW (ref 8.9–10.3)
Chloride: 108 mmol/L (ref 98–111)
Creatinine, Ser: 1.37 mg/dL — ABNORMAL HIGH (ref 0.61–1.24)
GFR calc Af Amer: 58 mL/min — ABNORMAL LOW (ref >60)
GFR calc non Af Amer: 50 mL/min — ABNORMAL LOW (ref >60)
Glucose, Bld: 81 mg/dL (ref 70–99)
Potassium: 3.6 mmol/L (ref 3.5–5.1)
Sodium: 141 mmol/L (ref 135–145)
Total Bilirubin: 0.5 mg/dL (ref 0.3–1.2)
Total Protein: 7.6 g/dL (ref 6.5–8.1)

## 2019-09-25 LAB — IRON AND TIBC
Iron: 34 ug/dL — ABNORMAL LOW (ref 42–163)
Saturation Ratios: 12 % — ABNORMAL LOW (ref 20–55)
TIBC: 296 ug/dL (ref 202–409)
UIBC: 262 ug/dL (ref 117–376)

## 2019-09-25 LAB — FERRITIN: Ferritin: 97 ng/mL (ref 24–336)

## 2019-09-25 NOTE — Progress Notes (Signed)
[name], [name], [name], [name], [name], [name], [name], [name], due to his extensive bruises, [name], [name], benefits, [name], phlebitis, headaches, [name], [name], 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 His aspirin is placed on hold due to numerous falls He also have cognitive impairment and is on medication for this His wife noticed he has excessive fatigue and daytime somnolence   SUMMARY OF ONCOLOGIC HISTORY:  [name]. is  here because of thrombocytopenia. 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  He was found to have abnormal CBC from abnormal blood work from routine blood work monitoring. I have reviewed outside records from his primary care doctor.  He is noted to have mild pancytopenia since 11/17/2015. On 11/17/2015, white blood cell count 5.9, hemoglobin 12.7 and platelet count 106 On 03/13/2016, white blood cell count 6.6, hemoglobin 12.6 and platelet count 103 On 09/06/2015, white blood cell count 6.6, hemoglobin 11.9 and platelet count 102 On 10/25/2016, white count 5.2, hemoglobin 12.7 and platelet count 86 On 11/08/16, white blood cell count 5.3, hemoglobin 12.2 implant, 107. On 09/05/2016, serum ferritin was high at 220, iron study showed serum iron low at 24 and 9% iron saturation.  Some older scanned records dated back to 12/13/2011 showed low platelet count of 101 On 12/21/2011, he had normal platelet count of 210 On 07/30/2012, his platelet count was borderline low at 143  On 09/08/2016, [name], such as spontaneous epistaxis, hematuria, 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, chills or abnormal weight loss Eyes: Denies blurriness of vision Ears, nose, mouth, throat, and face: Denies mucositis or sore throat Cardiovascular: Denies palpitation, chest discomfort or lower extremity swelling Gastrointestinal:  Denies nausea, heartburn or change in bowel habits Skin: Denies abnormal skin rashes Lymphatics: Denies new lymphadenopathy  Neurological:Denies numbness, tingling or new weaknesses Behavioral/Psych: Mood is stable, no new changes  All other systems were reviewed with the patient and are negative.  I have reviewed the past medical history, past surgical history, social history and family history with the patient and they are unchanged from previous note.  ALLERGIES:  is allergic to lipitor [atorvastatin]; nsaids; warfarin and related; and enbrel [etanercept].  MEDICATIONS:  Current Outpatient Medications  Medication Sig Dispense Refill  . acetaminophen (TYLENOL) 325 MG tablet Take 1-2 tablets (325-650 mg total) by mouth every 4 (four) hours as needed for mild pain. (Patient taking differently: Take 325-650 mg by mouth as needed for mild pain. )    . allopurinol (ZYLOPRIM) 300 MG tablet Take 300 mg by mouth daily.     [name] donepezil (ARICEPT) 10 MG tablet Take 1 tablet (10 mg total) by mouth at bedtime. 30 tablet 11  . ferrous sulfate 325 (65 FE) MG tablet Take 1 tablet (325 mg total) by mouth daily with breakfast. 30 tablet 1  . gabapentin (NEURONTIN) 300 MG capsule TAKE 1 CAPSULE BY MOUTH THREE TIMES A DAY 90 capsule 1  .  metoprolol tartrate (LOPRESSOR) 50 MG tablet Take 1 tablet (50 mg total) by mouth 2 (two) times daily. 60 tablet 1  . MYRBETRIQ 50 MG TB24 tablet TAKE 1 TABLET BY MOUTH EVERY DAY (Patient taking differently: Take 50 mg by mouth daily. ) 30 tablet 3  . neomycin-bacitracin-polymyxin (NEOSPORIN) ointment Apply 1 application topically daily as needed for  wound care.     . pantoprazole (PROTONIX) 40 MG tablet Take 40 mg by mouth daily.    . potassium chloride SA (K-DUR) 20 MEQ tablet Take 1 tablet (20 mEq total) by mouth daily. 30 tablet 1  . rosuvastatin (CRESTOR) 40 MG tablet Take 1 tablet (40 mg total) by mouth daily. (Patient taking differently: Take 40 mg by mouth every evening. ) 90 tablet 2  . sertraline (ZOLOFT) 100 MG tablet Take 200 mg by mouth daily.     . tamsulosin (FLOMAX) 0.4 MG CAPS capsule Take 0.4 mg by mouth at bedtime.     . torsemide (DEMADEX) 20 MG tablet Take 1 tablet (20 mg total) by mouth 2 (two) times daily. 180 tablet 3   Current Facility-Administered Medications  Medication Dose Route Frequency Provider Last Rate Last Dose  . mupirocin ointment (BACTROBAN) 2 %   Topical BID Magnant, Charles L, PA-C        PHYSICAL EXAMINATION: ECOG PERFORMANCE STATUS: 2 - Symptomatic, <50% confined to bed  Vitals:   09/25/19 0937  BP: 104/82  Pulse: 60  Resp: 18  Temp: 98.5 F (36.9 C)  SpO2: 97%   Filed Weights   09/25/19 0937  Weight: 183 lb 9.6 oz (83.3 kg)    GENERAL:alert, no distress and comfortable.  He is mildly obese SKIN: Noted significant multiple ecchymosis and bruises HEART: Noted mild bilateral lower extremity edema Musculoskeletal:no cyanosis of digits and no clubbing  NEURO: alert & oriented x 3 with fluent speech, no focal motor/sensory deficits  LABORATORY DATA:  I have reviewed the data as listed    Component Value Date/Time   NA 141 09/25/2019 0915   NA 146 (H) 09/08/2019 1602   NA 141 02/23/2017 1237   K 3.6 09/25/2019 0915   K 3.8 02/23/2017 1237   CL 108 09/25/2019 0915   CO2 25 09/25/2019 0915   CO2 27 02/23/2017 1237   GLUCOSE 81 09/25/2019 0915   GLUCOSE 113 02/23/2017 1237   BUN 20 09/25/2019 0915   BUN 30 (H) 09/08/2019 1602   BUN 18.3 02/23/2017 1237   CREATININE 1.37 (H) 09/25/2019 0915   CREATININE 1.2 02/23/2017 1237   CALCIUM 8.7 (L) 09/25/2019 0915   CALCIUM 9.6  02/23/2017 1237   PROT 7.6 09/25/2019 0915   PROT 7.3 06/18/2018 1205   PROT 8.0 02/23/2017 1237   ALBUMIN 3.8 09/25/2019 0915   ALBUMIN 4.1 09/24/2017 0824   ALBUMIN 4.1 02/23/2017 1237   AST 31 09/25/2019 0915   AST 24 02/23/2017 1237   ALT 20 09/25/2019 0915   ALT 15 02/23/2017 1237   ALKPHOS 113 09/25/2019 0915   ALKPHOS 105 02/23/2017 1237   BILITOT 0.5 09/25/2019 0915   BILITOT 0.6 09/24/2017 0824   BILITOT 0.98 02/23/2017 1237   GFRNONAA 50 (L) 09/25/2019 0915   GFRAA 58 (L) 09/25/2019 0915    No results found for: SPEP, UPEP  Lab Results  Component Value Date   WBC 3.3 (L) 09/25/2019   NEUTROABS 2.3 09/25/2019   HGB 9.2 (L) 09/25/2019   HCT 28.2 (L) 09/25/2019   MCV 96.6 09/25/2019  PLT 88 (L) 09/25/2019      Chemistry      Component Value Date/Time   NA 141 09/25/2019 0915   NA 146 (H) 09/08/2019 1602   NA 141 02/23/2017 1237   K 3.6 09/25/2019 0915   K 3.8 02/23/2017 1237   CL 108 09/25/2019 0915   CO2 25 09/25/2019 0915   CO2 27 02/23/2017 1237   BUN 20 09/25/2019 0915   BUN 30 (H) 09/08/2019 1602   BUN 18.3 02/23/2017 1237   CREATININE 1.37 (H) 09/25/2019 0915   CREATININE 1.2 02/23/2017 1237      Component Value Date/Time   CALCIUM 8.7 (L) 09/25/2019 0915   CALCIUM 9.6 02/23/2017 1237   ALKPHOS 113 09/25/2019 0915   ALKPHOS 105 02/23/2017 1237   AST 31 09/25/2019 0915   AST 24 02/23/2017 1237   ALT 20 09/25/2019 0915   ALT 15 02/23/2017 1237   BILITOT 0.5 09/25/2019 0915   BILITOT 0.6 09/24/2017 0824   BILITOT 0.98 02/23/2017 1237       All questions were answered. The patient knows to call the clinic with any problems, questions or concerns. No barriers to learning was detected.  I spent 25 minutes counseling the patient face to face. The total time spent in the appointment was 30 minutes and more than 50% was on counseling and review of test results  [name], MD 09/25/2019 2:49 PM

## 2019-09-25 NOTE — Assessment & Plan Note (Signed)
His last myeloma panel was just borderline abnormal None of his current signs or symptoms are related to this I will repeat his myeloma panel and will call his wife with results next week

## 2019-09-25 NOTE — Therapy (Signed)
[facility] [address] [place], [place], [address] Phone: [phone]   Fax:  [phone]  Physical Therapy Treatment and Discharge Summary  Patient Details  Name: [name]. MRN: [id number] Date of Birth: Oct 03, 1941 Referring Provider (PT): Alysia Penna, MD   Encounter Date: 09/25/2019  PT End of Session - 09/25/19 1631    Visit Number  13    Number of Visits  17    Date for PT Re-Evaluation  10/06/19    Authorization Type  medicare and AARP supplement    PT Start Time  1625    PT Stop Time  1705    PT Time Calculation (min)  40 min    Activity Tolerance  Patient tolerated treatment well    Behavior During Therapy  [employer] for tasks assessed/performed       Past Medical History:  Diagnosis Date  . (HFpEF) heart failure with preserved ejection fraction ([place])    a. 05/2013 Echo: EF 55%, mild LVH, diast dysfxn, Ao sclerosis, mildly dil LA, RV dysfxn (poorly visualized), PASP 7mHg;  b. 03/2017 Echo: EF 55-60%, no rwma, triv MR, mildly dil RV, mod TR, PASP 445mg.  . [facility]   s/p Cox Maze 1/09;  Multaq Rx d/c'd in 2014 due to pulmo fibrosis;  coumadin d/c'd in 2014 due to spontaneous subdural hematoma  . BPH (benign prostatic hyperplasia)   . CAD (coronary artery disease), native coronary artery    a. s/p CABG 12/2007;  b. Myoview 12/2011: EF 66%, no scar or ischemia; normal.  . DM (diabetes mellitus) ([facility]  . Hyperlipidemia type II   . Hypertension   . 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 OSA (obstructive sleep apnea)   . Pacemaker    PPM - St. Jude  . Peripheral neuropathy 07/31/2018  . Pulmonary fibrosis ([facility]   Multaq d/c'd 7/14  . 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 AMPUTATION Left 05/17/2019   Procedure:  AMPUTATION LEFT FOURTH TOE;  Surgeon: DeMeredith PelMD;  [facility] Service: Orthopedics;  Laterality: Left;  . APPENDECTOMY    . CHOLECYSTECTOMY    . CORONARY ARTERY BYPASS GRAFT     x3 (left internal mammary artery to distal left anterior descending coronary artery, saphenous vain graft to second circumflex marginal branch, saphenous vain graft to posterior descending coronary artery, endoscopic saphenous vain harvest from right thigh) and modified Cox - Maze IV procedure.  ClValentina GuOwen,MD. Electronically signed CHO/MEDQ D: 01/09/2008/ JOB: [id number]:  JoIran SizerD  . CRKyla Balzarine8/13/2013   Procedure: CRANIOTOMY HEMATOMA EVACUATION SUBDURAL;  Surgeon: GaElaina HoopsMD;  [facility];  Service: Neurosurgery;  Laterality: Right;  Right craniotomy for evacuation of subdural hematoma  . FOOT SURGERY    . HERNIA REPAIR    . ORCHIECTOMY     Left  /  testicular cancer  . PACEMAKER PLACEMENT     PPM - St. Jude  . PPM GENERATOR CHANGEOUT N/A 06/25/2019   Procedure: PPM GENERATOR CHANGEOUT;  Surgeon: TaEvans LanceMD;  [facility];  Service: Cardiovascular;  Laterality: N/A;    There were no vitals filed for this visit.  Subjective Assessment - 09/25/19 1628    Subjective  The patient reports  he was doing well until he fell today and hit his left hand and has skin abrasions.  He was using the walker when he fell and reports he got himself off of the floor.    Pertinent History  Lewy body dementia, debility, CHF, diabetes, CABG.    Patient Stated Goals  The patient was not using the walker before the stroke.    Currently in Pain?  No/denies                       [facility] Adult PT Treatment/Exercise - 09/25/19 0001      Ambulation/Gait   Ambulation/Gait  Yes    Ambulation/Gait Assistance  5: Supervision    Ambulation/Gait Assistance Details  Patient ambulated for longer distances indoors and outdoors on paved surfaces with rollator  RW.  PT provides supervision due to h/o frequent falls.    Ambulation Distance (Feet)  500 Feet    Assistive device  4-wheeled walker    Gait Pattern  Step-through pattern    Ambulation Surface  Level;Indoor;Paved;Outdoor    Stairs  Yes    Stairs Assistance  5: Supervision    Stair Management Technique  One rail Right;Alternating pattern    Number of Stairs  12      Self-Care   Self-Care  Other Self-Care Comments    Other Self-Care Comments   PT and patient reviewed the need for the rollator, discussed barriers to Rollator use in the home.  Discussed need for supervision in the home and avoiding high risk activities including use of ladders and walking outdoors without his rollator.  The patient's wife was met at car at end of session and PT reviewed the need for more consistent supervision and use of rollator for fall prevention.      Exercises   Exercises  Other Exercises    Other Exercises   standing exercise including marching, heel/toe raises and sit<.stand.               PT Short Term Goals - 09/18/19 2237      PT SHORT TERM GOAL #1   Title  The patient will perform HEP with wife's assist emphasizing LE strength, balance, general mobility.    Baseline  The patient's wife does not assist at home; patient reports he does occasionally, however not consistent with his response.    Time  4    Period  Weeks    Status  Not Met    Target Date  09/06/19      PT SHORT TERM GOAL #2   Title  The patient will improve Berg balance score from 35/56 to > or equal to 40/56 to demo dec'ing risk for falls.    Baseline  43/56    Time  4    Period  Weeks    Status  Achieved    Target Date  09/06/19      PT SHORT TERM GOAL #3   Title  The patient will improve gait speed from 1.47 ft/sec to > or equal to 2.0 ft/sec with rollator RW to demo improving functional mobility and dec'd risk for falls.    Baseline  2.3 ft/sec with rollator RW on 09/04/2019    Time  4    Period  Weeks     Status  Achieved    Target Date  09/06/19      PT SHORT TERM GOAL #4   Title  The patient will demo floor<>stand with UE  support and supervision due to h/o frequent falls.    Baseline  PT demonstrated and the patient performed with supervision.  *Upon standing on mat, the patient needed CGA due to more compliant surface.    Time  4    Period  Weeks    Status  Achieved    Target Date  09/06/19      PT SHORT TERM GOAL #5   Title  The patient's wife will report improved ability to rise from couch surfaces in the home.    Baseline  variable performance but relays overall better    Time  4    Period  Weeks    Status  Achieved    Target Date  09/06/19        PT Long Term Goals - 09/25/19 1633      PT LONG TERM GOAL #1   Title  The patient will be able to perform HEP progression with wife's assistance.    Baseline  Patient chooses to do without help from his wife, and is able to demonstrate with handout and supervision.    Time  8    Period  Weeks    Status  Partially Met      PT LONG TERM GOAL #2   Title  The patient will perform stair negotiation with reciprocal pattern + one rail x 4 steps mod indep.    Baseline  PT provides supervision due to frequent falls.  Pt demonstrates without loss of balance.    Time  8    Period  Weeks    Status  Partially Met      PT LONG TERM GOAL #3   Title  The patient will be able to ambulate in clinic without a device x 200 ft with supervision without loss of balance (as he forgets his device at home).    Baseline  Have encouraged the patient to ambulate with rollator at all times.    Time  8    Period  Weeks    Status  Deferred      PT LONG TERM GOAL #4   Title  The patient will improve Berg from 35/56 to > or equal to 44/56 to demo dec'ing risk for falls.    Baseline  Improved to 45/56 however continues with high fall risk due to dec'd attention and cognitive changes.    Time  8    Period  Weeks    Status  Achieved             Plan - 09/25/19 1723    Clinical Impression Statement  The patient partially met LTGs.  He continues to be at a high risk for falling and had a serious fall last week and another fall today before therapy.  PT has shifted focus to educate patient/caregiver on need for 24 hour supervision, use of rollator at all times. Discharge with patient/caregiver education and home program.    PT Treatment/Interventions  ADLs/Self Care Home Management;Neuromuscular re-education;Patient/family education;Gait training;Stair training;Functional mobility training;Therapeutic activities;Therapeutic exercise;Balance training;DME Instruction    PT Home Exercise Plan  Discharge.    Consulted and Agree with Plan of Care  Patient;Family member/caregiver    Family Member Consulted  walked patient to car at end of session to speak with his wife       Patient will benefit from skilled therapeutic intervention in order to improve the following deficits and impairments:  Abnormal gait, Decreased activity tolerance, Decreased balance, Decreased strength, Decreased mobility, Decreased  safety awareness, Decreased coordination  Visit Diagnosis: Muscle weakness (generalized)  Unsteadiness on feet  Other abnormalities of gait and mobility    PHYSICAL THERAPY DISCHARGE SUMMARY  Visits from Start of Care: 13  Current functional level related to goals / functional outcomes: See above   Remaining deficits: High fall risk due to dementia Dec'd attention to task Decreased balance  Education / Equipment: Home program, fall prevention, safe use of device  Plan: Patient agrees to discharge.  Patient goals were partially met. Patient is being discharged due to meeting the stated rehab goals.  ?????          Thank you for the referral of this patient. Rudell Cobb, MPT  [place] , PT 09/25/2019, 5:24 PM  [facility] [address]  [place], [place], [address] Phone: [phone]   Fax:  [phone]  Name: [name]. MRN: [id number] Date of Birth: 1941/07/19

## 2019-09-25 NOTE — Assessment & Plan Note (Signed)
He has multifactorial anemia, [name], benefits, and alternatives of intravenous iron infusions. The patient is symptomatic from anemia and the iron level is critically low. He tolerated oral iron supplement poorly and desires to achieved higher levels of iron faster for adequate hematopoesis. Some of the side-effects to be expected including risks of infusion reactions, phlebitis, headaches, nausea and fatigue.  The patient is willing to proceed. Patient education material was dispensed.  Goal is to keep ferritin level greater than 50 and resolution of anemia I discussed this plan of care with his wife who agree with IV iron I will try to schedule 2 doses of IV iron starting next week and I plan to recheck his iron panel when I see him back in a month from now

## 2019-09-25 NOTE — Assessment & Plan Note (Signed)
He has multifactorial deficiency anemia After IV iron replacement, [name], we will consider erythropoietin stimulating agent for anemia chronic kidney disease

## 2019-09-25 NOTE — Assessment & Plan Note (Signed)
The patient has multifactorial pancytopenia I believe the reduced white blood cell count and thrombocytopenia is due to sequestration from splenomegaly The cause of splenomegaly is due to liver cirrhosis causing splenic congestion There is nothing I could do to fix his pancytopenia He is not symptomatic from pancytopenia and does not need transfusion support from the low platelet standpoint There is no role for G-CSF support for reduced white blood cell counts I do not recommend bone marrow aspirate and biopsy as it would not change management We will observe this closely  From the low platelet standpoint, [name], due to his extensive bruises, his aspirin is placed on hold

## 2019-09-26 ENCOUNTER — Telehealth: Payer: Self-pay | Admitting: Hematology and Oncology

## 2019-09-26 LAB — KAPPA/LAMBDA LIGHT CHAINS
Kappa free light chain: 73 mg/L — ABNORMAL HIGH (ref 3.3–19.4)
Kappa, lambda light chain ratio: 2.43 — ABNORMAL HIGH (ref 0.26–1.65)
Lambda free light chains: 30 mg/L — ABNORMAL HIGH (ref 5.7–26.3)

## 2019-09-26 NOTE — Telephone Encounter (Signed)
I talk with patient regarding schedule  

## 2019-09-28 NOTE — Progress Notes (Signed)
Cardiology Office Note   Date:  09/29/2019   ID:  [name]., DOB [birth year], MRN [id number]  PCP:  [name], MD  Cardiologist: Dr. [name] [facility]; Follow Up   History of Present Illness: [name]. is a 78 y.o. male who presents for ongoing assessment and management of coronary artery disease with history of CABG (LIMA to LAD, SVG to OM 2, SVG to PDA, additionally with modified Cox Maze IV procedure in January 2009.  Other history includes atrial fibrillation, pacemaker implantation for sinus node dysfunction and symptomatic bradycardia.    The patient was taken off of Coumadin in the setting of spontaneous subdural hematoma and August 2013 requiring a craniotomy and evacuation by Dr. Saintclair Halsted.  He is no longer to be considered an anticoagulation candidate.  He was intolerant to Multaq, as it was felt that it was causing pulmonary toxicity.  CT scan in August 2020 did show mild to moderate pulmonary fibrosis.  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  He is also being seen by neurology for mild dementia, vivid dreams, and falling asleep very easily.  He is had increased doses of Aricept to 10 mg from 5 mg by neurology, causing sleepiness and grogginess throughout the day.  When last seen in the office on 09/09/2019 the patient had fallen 3 times in the last 5 days prior to being seen with numerous injuries to his face, a black eye on the left, and several facial lacerations.  I decreased torsemide back down to 20 mg twice daily and Flomax to 0.2 mg daily due to hypotension and dizziness.  I also recommended that his Aricept be returned to 5 mg at at bedtime but to follow-up with neurology for their recommendations.  He was seen by Dr. Crissie Sickles on 09/24/2019 for pacemaker interrogation.  Blood pressure at that time was 114/66.  He was noted to have  asymptomatic episodes of atrial tachycardia.  No medication changes were made.  Watchful waiting was recommended.  [name] comes today feeling much better.  He has not fallen again since being seen last.  He is followed with hematology due to thrombocytopenia and is now receiving transfusions.  He continues to be hypotensive.   Past Medical History:  Diagnosis Date  . (HFpEF) heart failure with preserved ejection fraction ([place])    a. 05/2013 Echo: EF 55%, mild LVH, diast dysfxn, Ao sclerosis, mildly dil LA, RV dysfxn (poorly visualized), PASP 17mmHg;  b. 03/2017 Echo: EF 55-60%, no rwma, triv MR, mildly dil RV, mod TR, PASP 60mmHg.  . Atrial fibrillation [facility])    s/p Cox Maze 1/09;  Multaq Rx d/c'd in 2014 due to pulmo fibrosis;  coumadin d/c'd in 2014 due to spontaneous subdural hematoma  . BPH (benign prostatic hyperplasia)   . CAD (coronary artery disease), native coronary artery    a. s/p CABG 12/2007;  b. Myoview 12/2011: EF 66%, no scar or ischemia; normal.  . DM (diabetes mellitus) ([place])   . Hyperlipidemia type II   . Hypertension   . 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 OSA (obstructive sleep apnea)   . Pacemaker    PPM - St. Jude  . Peripheral neuropathy 07/31/2018  . Pulmonary fibrosis ([place])    Multaq d/c'd 7/14  . Subdural hematoma ([place]) 07/2012   spontaneous;  coumadin d/c'd =>  no longer a candidate for anticoagulation    Past Surgical History:  Procedure Laterality Date  . AMPUTATION Left 05/17/2019   Procedure: AMPUTATION LEFT FOURTH TOE;  Surgeon: Meredith Pel, MD;  Location: [facility];  Service: Orthopedics;  Laterality: Left;  . APPENDECTOMY    . CHOLECYSTECTOMY    . CORONARY ARTERY BYPASS GRAFT     x3 (left internal mammary artery to distal left anterior descending coronary artery, saphenous vain graft to second circumflex marginal branch, saphenous vain graft to posterior descending coronary artery, endoscopic saphenous vain  harvest from right thigh) and modified Cox - Maze IV procedure.  Jamie Gu. [name],MD. Electronically signed CHO/MEDQ D: 01/09/2008/ JOB: [id number] cc:  [name]  . [name]  07/30/2012   Procedure: CRANIOTOMY HEMATOMA EVACUATION SUBDURAL;  Surgeon: [name], MD;  Location: [facility];  Service: Neurosurgery;  Laterality: Right;  Right craniotomy for evacuation of subdural hematoma  . FOOT SURGERY    . HERNIA REPAIR    . ORCHIECTOMY     Left  /  testicular cancer  . PACEMAKER PLACEMENT     PPM - St. Jude  . PPM GENERATOR CHANGEOUT N/A 06/25/2019   Procedure: PPM GENERATOR CHANGEOUT;  Surgeon: Evans Lance, MD;  Location: [facility];  Service: Cardiovascular;  Laterality: N/A;     Current Outpatient Medications  Medication Sig Dispense Refill  . torsemide (DEMADEX) 20 MG tablet Take 20 mg by mouth daily.    [name] acetaminophen (TYLENOL) 325 MG tablet Take 1-2 tablets (325-650 mg total) by mouth every 4 (four) hours as needed for mild pain. (Patient taking differently: Take 325-650 mg by mouth as needed for mild pain. )    . allopurinol (ZYLOPRIM) 300 MG tablet Take 300 mg by mouth daily.     [name] donepezil (ARICEPT) 10 MG tablet Take 1 tablet (10 mg total) by mouth at bedtime. 30 tablet 11  . ferrous sulfate 325 (65 FE) MG tablet Take 1 tablet (325 mg total) by mouth daily with breakfast. 30 tablet 1  . gabapentin (NEURONTIN) 300 MG capsule TAKE 1 CAPSULE BY MOUTH THREE TIMES A DAY 90 capsule 1  . metoprolol tartrate (LOPRESSOR) 50 MG tablet Take 1 tablet (50 mg total) by mouth 2 (two) times daily. 60 tablet 1  . MYRBETRIQ 50 MG TB24 tablet TAKE 1 TABLET BY MOUTH EVERY DAY (Patient taking differently: Take 50 mg by mouth daily. ) 30 tablet 3  . neomycin-bacitracin-polymyxin (NEOSPORIN) ointment Apply 1 application topically daily as needed for wound care.     . pantoprazole (PROTONIX) 40 MG tablet Take 40 mg by mouth daily.    . potassium chloride SA (K-DUR) 20 MEQ tablet Take  1 tablet (20 mEq total) by mouth daily. 30 tablet 1  . rosuvastatin (CRESTOR) 40 MG tablet Take 1 tablet (40 mg total) by mouth daily. (Patient taking differently: Take 40 mg by mouth every evening. ) 90 tablet 2  . sertraline (ZOLOFT) 100 MG tablet Take 200 mg by mouth daily.     . tamsulosin (FLOMAX) 0.4 MG CAPS capsule Take 0.4 mg by mouth at bedtime.      Current Facility-Administered Medications  Medication Dose Route Frequency Provider Last Rate Last Dose  . mupirocin ointment (BACTROBAN) 2 %   Topical BID Magnant, Charles L, PA-C        Allergies:   Lipitor [atorvastatin], Nsaids, Warfarin and related, and Enbrel [etanercept]    Social History:  The patient  reports that he quit smoking about 28 years ago. His smoking use included cigarettes. He has a 40.00 pack-year smoking history. He has never used smokeless tobacco. He reports that he does not drink alcohol or use drugs.   Family History:  The patient's family history includes Alzheimer's disease in his mother; Dementia in his mother; Heart attack in his father; Heart disease in his father and mother; Heart failure in his father.    ROS: All other systems are reviewed and negative. Unless otherwise mentioned in H&P    PHYSICAL EXAM: VS:  BP 103/67   Pulse 66   Ht 5\' 8"  (1.727 m)   Wt 186 lb 8 oz (84.6 kg)   SpO2 99%   BMI 28.36 kg/m  , BMI Body mass index is 28.36 kg/m. GEN: Well nourished, well developed, in no acute distress HEENT: normal Neck: no JVD, carotid bruits, or masses Cardiac: RRR; 1/6 systolic murmur, rubs, or gallops,no edema, diminished distal pulses Respiratory:  Clear to auscultation bilaterally, normal work of breathing GI: soft, nontender, nondistended, + BS MS: no deformity or atrophy Skin: warm and dry, no rash Neuro:  Strength and sensation are intact Psych: euthymic mood, full affect   EKG: Not completed this office visit  Recent Labs: 05/16/2019: B Natriuretic Peptide 289.1; TSH 2.022  09/09/2019: Magnesium 2.3 09/25/2019: ALT 20; BUN 20; Creatinine, Ser 1.37; Hemoglobin 9.2; Platelets 88; Potassium 3.6; Sodium 141    Lipid Panel    Component Value Date/Time   CHOL 103 05/22/2019 0338   CHOL 119 09/24/2017 0824   TRIG 58 05/22/2019 0338   HDL 40 (L) 05/22/2019 0338   HDL 42 09/24/2017 0824   CHOLHDL 2.6 05/22/2019 0338   VLDL 12 05/22/2019 0338   LDLCALC 51 05/22/2019 0338   LDLCALC 58 09/24/2017 0824   LDLDIRECT 154.2 10/20/2013 0846      Wt Readings from Last 3 Encounters:  09/29/19 186 lb 8 oz (84.6 kg)  09/25/19 183 lb 9.6 oz (83.3 kg)  09/24/19 180 lb (81.6 kg)      Other studies Reviewed:  Echocardiogram 04/20/2019 1. The left ventricle has low normal systolic function, with an ejection fraction of 50-55%. The cavity size was normal. Left ventricular diastolic Doppler parameters are indeterminate. 2. LVEF is approximately 50 to 55% with septal hypokinesis. Poor. 3. The right ventricle was not well visualized. The cavity was moderately enlarged. There is no increase in right ventricular wall thickness. 4. RV is difficult to visualize, even with Definity It appears dilated and function is at least mildly reduced. 5. The mitral valve is abnormal. Mild thickening of the mitral valve leaflet. 6. The tricuspid valve is grossly normal. Tricuspid valve regurgitation is severe. 7. The aortic valve was not well visualized. Mild thickening of the aortic valve. Mild calcification of the aortic valve. 8. The inferior vena cava was dilated in size with <50% respiratory variability. 9. The interatrial septum was not assessed.   ASSESSMENT AND PLAN:  1.  Chronic right-sided heart failure: Difficult situation as he also is anemic and can become volume overloaded easily.  Due to hypotension I am going to cautiously decrease torsemide to 20 mg daily from 20 mg twice daily.  We will continue metoprolol as directed.  If he begins to gain weight, have fluid  retention, or worsening breathing status, he is to call us.  We may have to alternate with twice daily dosing 1 day, and daily dosing the next.  He will be seen in 1  month for follow-up  2.  Atrial fibrillation: Neurology recommended change in dose of metoprolol.  However, on recent pacemaker interrogation the patient was having episodes of atrial tachycardia although asymptomatic.  I do not think reducing beta-blocker will be beneficial at this time as he may have more exacerbations.  He is not a candidate for anticoagulation therapy in the setting of SDH.  3.  Syncopal episodes: Improved without any further events since being seen last.  As discussed above we will cautiously decrease torsemide from 40 mg twice daily to 20 mg daily.  3.  Coronary artery disease: History of CABG.  No recurrent chest discomfort at this time.  Energy level is actually improving with changes in medication.  Continue metoprolol as directed and secondary prevention with statin therapy.  4.  Anemia with thrombocytopenia: Followed by hematology.  Due to have infusions.  They are managing labs at this time.  Current medicines are reviewed at length with the patient today.    Labs/ tests ordered today include: None  Phill Myron. [place] Pugh, ANP, AACC   09/29/2019 8:52 AM    [facility] [address] Office [phone] Fax [phone]  Notice: This dictation was prepared with Dragon dictation along with smaller phrase technology. Any transcriptional errors that result from this process are unintentional and may not be corrected upon review.

## 2019-09-29 ENCOUNTER — Other Ambulatory Visit: Payer: Self-pay

## 2019-09-29 ENCOUNTER — Encounter: Payer: Self-pay | Admitting: Adult Health

## 2019-09-29 ENCOUNTER — Ambulatory Visit (INDEPENDENT_AMBULATORY_CARE_PROVIDER_SITE_OTHER): Payer: Medicare Other | Admitting: Adult Health

## 2019-09-29 VITALS — BP 103/67 | HR 66 | Ht 68.0 in | Wt 186.5 lb

## 2019-09-29 DIAGNOSIS — I6330 Cerebral infarction due to thrombosis of unspecified cerebral artery: Secondary | ICD-10-CM

## 2019-09-29 DIAGNOSIS — I4821 Permanent atrial fibrillation: Secondary | ICD-10-CM

## 2019-09-29 DIAGNOSIS — M9903 Segmental and somatic dysfunction of lumbar region: Secondary | ICD-10-CM | POA: Diagnosis not present

## 2019-09-29 DIAGNOSIS — D649 Anemia, unspecified: Secondary | ICD-10-CM | POA: Diagnosis not present

## 2019-09-29 DIAGNOSIS — M5431 Sciatica, right side: Secondary | ICD-10-CM | POA: Diagnosis not present

## 2019-09-29 DIAGNOSIS — I50812 Chronic right heart failure: Secondary | ICD-10-CM

## 2019-09-29 DIAGNOSIS — M9901 Segmental and somatic dysfunction of cervical region: Secondary | ICD-10-CM | POA: Diagnosis not present

## 2019-09-29 DIAGNOSIS — I2510 Atherosclerotic heart disease of native coronary artery without angina pectoris: Secondary | ICD-10-CM

## 2019-09-29 DIAGNOSIS — M5432 Sciatica, left side: Secondary | ICD-10-CM | POA: Diagnosis not present

## 2019-09-29 LAB — MULTIPLE MYELOMA PANEL, SERUM
Albumin SerPl Elph-Mcnc: 3.8 g/dL (ref 2.9–4.4)
Albumin/Glob SerPl: 1.2 (ref 0.7–1.7)
Alpha 1: 0.2 g/dL (ref 0.0–0.4)
Alpha2 Glob SerPl Elph-Mcnc: 0.7 g/dL (ref 0.4–1.0)
B-Globulin SerPl Elph-Mcnc: 1.1 g/dL (ref 0.7–1.3)
Gamma Glob SerPl Elph-Mcnc: 1.2 g/dL (ref 0.4–1.8)
Globulin, Total: 3.2 g/dL (ref 2.2–3.9)
IgA: 661 mg/dL — ABNORMAL HIGH (ref 61–437)
IgG (Immunoglobin G), Serum: 1448 mg/dL (ref 603–1613)
IgM (Immunoglobulin M), Srm: 95 mg/dL (ref 15–143)
M Protein SerPl Elph-Mcnc: 0.3 g/dL — ABNORMAL HIGH
Total Protein ELP: 7 g/dL (ref 6.0–8.5)

## 2019-09-29 NOTE — Patient Instructions (Signed)
Medication Instructions:  DECREASE- Torsemide 20 mg by mouth daily  If you need a refill on your cardiac medications before your next appointment, please call your pharmacy.  Labwork: None ordered   Testing/Procedures: None Ordered   Follow-Up: . Your physician recommends that you schedule a follow-up appointment in: 1 Month   At [facility], you and your health needs are our priority.  As part of our continuing mission to provide you with exceptional heart care, we have created designated Provider Care Teams.  These Care Teams include your primary Cardiologist (physician) and Advanced Practice Providers (APPs -  Physician Assistants and Nurse Practitioners) who all work together to provide you with the care you need, when you need it.  Thank you for choosing [facility]!!

## 2019-10-02 DIAGNOSIS — M5431 Sciatica, right side: Secondary | ICD-10-CM | POA: Diagnosis not present

## 2019-10-02 DIAGNOSIS — M9903 Segmental and somatic dysfunction of lumbar region: Secondary | ICD-10-CM | POA: Diagnosis not present

## 2019-10-02 DIAGNOSIS — M5432 Sciatica, left side: Secondary | ICD-10-CM | POA: Diagnosis not present

## 2019-10-02 DIAGNOSIS — M9901 Segmental and somatic dysfunction of cervical region: Secondary | ICD-10-CM | POA: Diagnosis not present

## 2019-10-03 ENCOUNTER — Inpatient Hospital Stay: Payer: Medicare Other

## 2019-10-03 ENCOUNTER — Other Ambulatory Visit: Payer: Self-pay

## 2019-10-03 VITALS — BP 109/64 | HR 93 | Temp 97.5°F | Resp 18

## 2019-10-03 DIAGNOSIS — D61818 Other pancytopenia: Secondary | ICD-10-CM | POA: Diagnosis not present

## 2019-10-03 DIAGNOSIS — D472 Monoclonal gammopathy: Secondary | ICD-10-CM | POA: Diagnosis not present

## 2019-10-03 DIAGNOSIS — R161 Splenomegaly, not elsewhere classified: Secondary | ICD-10-CM | POA: Diagnosis not present

## 2019-10-03 DIAGNOSIS — D509 Iron deficiency anemia, unspecified: Secondary | ICD-10-CM

## 2019-10-03 DIAGNOSIS — Z79899 Other long term (current) drug therapy: Secondary | ICD-10-CM | POA: Diagnosis not present

## 2019-10-03 DIAGNOSIS — K7460 Unspecified cirrhosis of liver: Secondary | ICD-10-CM | POA: Diagnosis not present

## 2019-10-03 DIAGNOSIS — D638 Anemia in other chronic diseases classified elsewhere: Secondary | ICD-10-CM | POA: Diagnosis not present

## 2019-10-03 MED ORDER — SODIUM CHLORIDE 0.9 % IV SOLN
Freq: Once | INTRAVENOUS | Status: AC
  Administered 2019-10-03: 09:00:00 via INTRAVENOUS
  Filled 2019-10-03: qty 250

## 2019-10-03 MED ORDER — SODIUM CHLORIDE 0.9 % IV SOLN
510.0000 mg | Freq: Once | INTRAVENOUS | Status: AC
  Administered 2019-10-03: 510 mg via INTRAVENOUS
  Filled 2019-10-03: qty 510

## 2019-10-03 NOTE — Patient Instructions (Signed)

## 2019-10-06 ENCOUNTER — Other Ambulatory Visit: Payer: Self-pay | Admitting: Neurology

## 2019-10-06 DIAGNOSIS — M9901 Segmental and somatic dysfunction of cervical region: Secondary | ICD-10-CM | POA: Diagnosis not present

## 2019-10-06 DIAGNOSIS — M9903 Segmental and somatic dysfunction of lumbar region: Secondary | ICD-10-CM | POA: Diagnosis not present

## 2019-10-06 DIAGNOSIS — M5432 Sciatica, left side: Secondary | ICD-10-CM | POA: Diagnosis not present

## 2019-10-06 DIAGNOSIS — M5431 Sciatica, right side: Secondary | ICD-10-CM | POA: Diagnosis not present

## 2019-10-08 DIAGNOSIS — D696 Thrombocytopenia, unspecified: Secondary | ICD-10-CM | POA: Diagnosis not present

## 2019-10-08 DIAGNOSIS — Z9181 History of falling: Secondary | ICD-10-CM | POA: Diagnosis not present

## 2019-10-08 DIAGNOSIS — I10 Essential (primary) hypertension: Secondary | ICD-10-CM | POA: Diagnosis not present

## 2019-10-08 DIAGNOSIS — N189 Chronic kidney disease, unspecified: Secondary | ICD-10-CM | POA: Diagnosis not present

## 2019-10-08 DIAGNOSIS — M109 Gout, unspecified: Secondary | ICD-10-CM | POA: Diagnosis not present

## 2019-10-08 DIAGNOSIS — I2510 Atherosclerotic heart disease of native coronary artery without angina pectoris: Secondary | ICD-10-CM | POA: Diagnosis not present

## 2019-10-09 ENCOUNTER — Other Ambulatory Visit: Payer: Self-pay

## 2019-10-09 ENCOUNTER — Encounter: Payer: Self-pay | Admitting: Orthopedic Surgery

## 2019-10-09 ENCOUNTER — Ambulatory Visit (INDEPENDENT_AMBULATORY_CARE_PROVIDER_SITE_OTHER): Payer: Medicare Other | Admitting: Orthopedic Surgery

## 2019-10-09 VITALS — Ht 68.0 in | Wt 186.5 lb

## 2019-10-09 DIAGNOSIS — M9901 Segmental and somatic dysfunction of cervical region: Secondary | ICD-10-CM | POA: Diagnosis not present

## 2019-10-09 DIAGNOSIS — I872 Venous insufficiency (chronic) (peripheral): Secondary | ICD-10-CM | POA: Diagnosis not present

## 2019-10-09 DIAGNOSIS — B351 Tinea unguium: Secondary | ICD-10-CM | POA: Diagnosis not present

## 2019-10-09 DIAGNOSIS — I6330 Cerebral infarction due to thrombosis of unspecified cerebral artery: Secondary | ICD-10-CM | POA: Diagnosis not present

## 2019-10-09 DIAGNOSIS — M5431 Sciatica, right side: Secondary | ICD-10-CM | POA: Diagnosis not present

## 2019-10-09 DIAGNOSIS — M6702 Short Achilles tendon (acquired), left ankle: Secondary | ICD-10-CM | POA: Diagnosis not present

## 2019-10-09 DIAGNOSIS — M6701 Short Achilles tendon (acquired), right ankle: Secondary | ICD-10-CM

## 2019-10-09 DIAGNOSIS — M5432 Sciatica, left side: Secondary | ICD-10-CM | POA: Diagnosis not present

## 2019-10-09 DIAGNOSIS — M9903 Segmental and somatic dysfunction of lumbar region: Secondary | ICD-10-CM | POA: Diagnosis not present

## 2019-10-10 ENCOUNTER — Other Ambulatory Visit: Payer: Self-pay

## 2019-10-10 ENCOUNTER — Encounter: Payer: Self-pay | Admitting: Orthopedic Surgery

## 2019-10-10 ENCOUNTER — Inpatient Hospital Stay: Payer: Medicare Other

## 2019-10-10 VITALS — BP 107/61 | HR 59 | Resp 17

## 2019-10-10 DIAGNOSIS — D638 Anemia in other chronic diseases classified elsewhere: Secondary | ICD-10-CM | POA: Diagnosis not present

## 2019-10-10 DIAGNOSIS — D472 Monoclonal gammopathy: Secondary | ICD-10-CM | POA: Diagnosis not present

## 2019-10-10 DIAGNOSIS — Z79899 Other long term (current) drug therapy: Secondary | ICD-10-CM | POA: Diagnosis not present

## 2019-10-10 DIAGNOSIS — K7460 Unspecified cirrhosis of liver: Secondary | ICD-10-CM | POA: Diagnosis not present

## 2019-10-10 DIAGNOSIS — D509 Iron deficiency anemia, unspecified: Secondary | ICD-10-CM

## 2019-10-10 DIAGNOSIS — D61818 Other pancytopenia: Secondary | ICD-10-CM | POA: Diagnosis not present

## 2019-10-10 DIAGNOSIS — R161 Splenomegaly, not elsewhere classified: Secondary | ICD-10-CM | POA: Diagnosis not present

## 2019-10-10 MED ORDER — SODIUM CHLORIDE 0.9 % IV SOLN
510.0000 mg | Freq: Once | INTRAVENOUS | Status: AC
  Administered 2019-10-10: 510 mg via INTRAVENOUS
  Filled 2019-10-10: qty 510

## 2019-10-10 MED ORDER — SODIUM CHLORIDE 0.9 % IV SOLN
Freq: Once | INTRAVENOUS | Status: AC
  Administered 2019-10-10: 09:00:00 via INTRAVENOUS
  Filled 2019-10-10: qty 250

## 2019-10-10 NOTE — Progress Notes (Signed)
Office Visit Note   Patient: [name].           Date of Birth: March 30, 1941           MRN: [id number] Visit Date: 10/09/2019              Requested by: Jamie Pretty, MD [address] [place],  [name] [id number] PCP: Jamie Pretty, MD  Chief Complaint  Patient presents with  . Right Foot - Follow-up  . Left Foot - Follow-up      HPI: Patient is a 78 year old gentleman who presents in follow-up for venous insufficiency as well as onychomycotic nails.  Patient states that he is having some swelling in the right foot.  Assessment & Plan: Visit Diagnoses:  1. Onychomycosis   2. Venous stasis dermatitis of both lower extremities     Plan: Nails were trimmed x8 without complications.  Patient does have a superficial ulcer on the right toe PIP joint recommended deeper shoes also recommended going to the shoe marked to have his current shoes stretched out.  A Band-Aid was applied.  Follow-Up Instructions: Return in about 4 weeks (around 11/06/2019).   Ortho Exam  Patient is alert, oriented, no adenopathy, well-dressed, normal affect, normal respiratory effort. Semination patient has a superficial ulcer over the PIP joint right second toe secondary to rubbing in his shoe where there is no exposed bone or tendon no sausage digit swelling patient has good palpable dorsalis pedis pulses.  He has venous stasis ulceration of the left leg which is stable.  Brawny skin color changes no cellulitis no drainage.  Patient has thickened discolored onychomycotic nails x8 he is unable to safely trim the nails on his own and the nails were trimmed x8 without complications.  Patient has dorsiflexion to neutral and Achilles stretching was reinforced.  Imaging: No results found. No images are attached to the encounter.  Labs: Lab Results  Component Value Date   HGBA1C 5.6 05/21/2019   HGBA1C 5.7 (H) 07/30/2012   HGBA1C  01/07/2008    5.3 (NOTE)   The ADA recommends the  following therapeutic goals for glycemic   control related to Hgb A1C measurement:   Goal of Therapy:   < 7.0% Hgb A1C   Action Suggested:  > 8.0% Hgb A1C   Ref:  Diabetes Care, 22, Suppl. 1, 1999   ESRSEDRATE 40 (H) 05/01/2019   ESRSEDRATE 45 (H) 12/16/2018   ESRSEDRATE 18 06/18/2018   CRP <0.5 06/16/2013   LABURIC 3.9 12/16/2018   REPTSTATUS 05/20/2019 FINAL 05/15/2019   CULT  05/15/2019    NO GROWTH 5 DAYS Performed at [facility] [address] [place], [place] [address]      Lab Results  Component Value Date   ALBUMIN 3.8 09/25/2019   ALBUMIN 3.5 05/27/2019   ALBUMIN 3.2 (L) 05/26/2019   LABURIC 3.9 12/16/2018    Lab Results  Component Value Date   MG 2.3 09/09/2019   MG 2.0 05/26/2019   MG 2.1 05/25/2019   No results found for: VD25OH  No results found for: PREALBUMIN CBC EXTENDED Latest Ref Rng & Units 09/25/2019 09/09/2019 06/27/2019  WBC 4.0 - 10.5 K/uL 3.3(L) 4.9 4.0  RBC 4.22 - 5.81 MIL/uL 2.92(L) 3.05(L) 3.56(L)  HGB 13.0 - 17.0 g/dL 9.2(L) 9.4(L) 10.4(L)  HCT 39.0 - 52.0 % 28.2(L) 28.6(L) 32.3(L)  PLT 150 - 400 K/uL 88(L) 82(LL) 75(L)  NEUTROABS 1.7 - 7.7 K/uL 2.3 - 2.7  LYMPHSABS  0.7 - 4.0 K/uL 0.7 - 0.9     Body mass index is 28.36 kg/m.  Orders:  No orders of the defined types were placed in this encounter.  No orders of the defined types were placed in this encounter.    Procedures: No procedures performed  Clinical Data: No additional findings.  ROS:  All other systems negative, except as noted in the HPI. Review of Systems  Objective: Vital Signs: Ht 5\' 8"  (1.727 m)   Wt 186 lb 8 oz (84.6 kg)   BMI 28.36 kg/m   Specialty Comments:  No specialty comments available.  PMFS History: Patient Active Problem List   Diagnosis Date Noted  . Symptomatic bradycardia 09/24/2019  . Chronic diastolic heart failure ([place]) 06/03/2019  . Coronary artery disease involving native heart without angina pectoris 06/03/2019  . Debility  05/26/2019  . 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 Toe infection 05/17/2019  . DOE (dyspnea on exertion) 05/15/2019  . Severe tricuspid regurgitation 05/15/2019  . Acute on chronic right-sided heart failure ([place]) 05/15/2019  . Cellulitis of fourth toe of left foot   . Excessive daytime sleepiness 02/11/2019  . 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[place]) 02/11/2019  . Iron deficiency anemia 02/04/2019  . Poor memory 12/16/2018  . Deficiency anemia 11/19/2018  . Other fatigue 11/19/2018  . History of elevated PSA 11/19/2018  . Prostate cancer screening 11/19/2018  . Prostate CA ([place]) 11/19/2018  . Pancytopenia, acquired ([place]) 08/13/2018  . Recurrent falls 08/13/2018  . 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 Peripheral neuropathy 07/31/2018  . 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 Anemia, chronic disease 01/28/2018  . History of skin cancer 07/27/2017  . Fatty liver disease, nonalcoholic [id number]  . Thrombocytopenia ([place]) 11/27/2016  . Lung nodule 04/23/2014  . Postinflammatory pulmonary fibrosis ([place]) 06/13/2013  . Long term (current) use of anticoagulants 11/22/2011  . Abdominal bruit 03/07/2011  . Pacemaker-St.Jude   . Hx of CABG   . Hyperlipidemia type II   . OSA (obstructive sleep apnea)   . PACEMAKER, PERMANENT 08/17/2010  . HYPERLIPIDEMIA 08/16/2010  . Obesity 08/16/2010  . Essential hypertension 08/16/2010  . Atrial fibrillation ([place]) 08/16/2010  . IRREGULAR HEART RATE 08/16/2010  . Sleep apnea 08/16/2010  . BENIGN PROSTATIC HYPERTROPHY, HX OF 08/16/2010   Past Medical History:  Diagnosis Date  . (HFpEF) heart failure with preserved ejection fraction ([place])    a. 05/2013 Echo: EF 55%, mild LVH, diast dysfxn, Ao sclerosis, mildly dil LA, RV dysfxn (poorly visualized), PASP 57mmHg;  b. 03/2017 Echo: EF 55-60%, no rwma, triv MR, mildly dil RV, mod TR, PASP 58mmHg.  . Atrial fibrillation [facility])     s/p Cox Maze 1/09;  Multaq Rx d/c'd in 2014 due to pulmo fibrosis;  coumadin d/c'd in 2014 due to spontaneous subdural hematoma  . BPH (benign prostatic hyperplasia)   . CAD (coronary artery disease), native coronary artery    a. s/p CABG 12/2007;  b. Myoview 12/2011: EF 66%, no scar or ischemia; normal.  . DM (diabetes mellitus) ([place])   . Hyperlipidemia type II   . Hypertension   . 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 OSA (obstructive sleep apnea)   . Pacemaker    PPM - St. Jude  . Peripheral neuropathy 07/31/2018  . Pulmonary fibrosis ([place])    Multaq d/c'd 7/14  . Subdural hematoma ([place]) 07/2012   spontaneous;  coumadin d/c'd => no longer  a candidate for anticoagulation    Family History  Problem Relation Age of Onset  . Heart disease Father   . Heart attack Father   . Heart failure Father   . Heart disease Mother   . Alzheimer's disease Mother   . Dementia Mother     Past Surgical History:  Procedure Laterality Date  . AMPUTATION Left 05/17/2019   Procedure: AMPUTATION LEFT FOURTH TOE;  Surgeon: Meredith Pel, MD;  Location: [facility];  Service: Orthopedics;  Laterality: Left;  . APPENDECTOMY    . CHOLECYSTECTOMY    . CORONARY ARTERY BYPASS GRAFT     x3 (left internal mammary artery to distal left anterior descending coronary artery, saphenous vain graft to second circumflex marginal branch, saphenous vain graft to posterior descending coronary artery, endoscopic saphenous vain harvest from right thigh) and modified Cox - Maze IV procedure.  [name],MD. Electronically signed CHO/MEDQ D: 01/09/2008/ JOB: [id number] cc:  [name]  . [name]  07/30/2012   Procedure: CRANIOTOMY HEMATOMA EVACUATION SUBDURAL;  Surgeon: [name], MD;  Location: [facility];  Service: Neurosurgery;  Laterality: Right;  Right craniotomy for evacuation of subdural hematoma  . FOOT SURGERY    . HERNIA REPAIR    . ORCHIECTOMY     Left  /  testicular  cancer  . PACEMAKER PLACEMENT     PPM - St. Jude  . PPM GENERATOR CHANGEOUT N/A 06/25/2019   Procedure: PPM GENERATOR CHANGEOUT;  Surgeon: Evans Lance, MD;  Location: [facility] CV [facility];  Service: Cardiovascular;  Laterality: N/A;   Social History   Occupational History  . Occupation: Retired- IT trainer  Tobacco Use  . Smoking status: Former Smoker    Packs/day: 2.00    Years: 20.00    Pack years: 40.00    Types: Cigarettes    Quit date: 02/21/1991    Years since quitting: 28.6  . Smokeless tobacco: Never Used  Substance and Sexual Activity  . Alcohol use: No    Alcohol/week: 0.0 standard drinks  . Drug use: No  . Sexual activity: Not Currently

## 2019-10-10 NOTE — Patient Instructions (Signed)

## 2019-10-13 DIAGNOSIS — M5431 Sciatica, right side: Secondary | ICD-10-CM | POA: Diagnosis not present

## 2019-10-13 DIAGNOSIS — M5432 Sciatica, left side: Secondary | ICD-10-CM | POA: Diagnosis not present

## 2019-10-13 DIAGNOSIS — M9901 Segmental and somatic dysfunction of cervical region: Secondary | ICD-10-CM | POA: Diagnosis not present

## 2019-10-13 DIAGNOSIS — M9903 Segmental and somatic dysfunction of lumbar region: Secondary | ICD-10-CM | POA: Diagnosis not present

## 2019-10-14 DIAGNOSIS — Z86010 Personal history of colonic polyps: Secondary | ICD-10-CM | POA: Diagnosis not present

## 2019-10-20 DIAGNOSIS — M5431 Sciatica, right side: Secondary | ICD-10-CM | POA: Diagnosis not present

## 2019-10-20 DIAGNOSIS — M9903 Segmental and somatic dysfunction of lumbar region: Secondary | ICD-10-CM | POA: Diagnosis not present

## 2019-10-20 DIAGNOSIS — M9901 Segmental and somatic dysfunction of cervical region: Secondary | ICD-10-CM | POA: Diagnosis not present

## 2019-10-20 DIAGNOSIS — M5432 Sciatica, left side: Secondary | ICD-10-CM | POA: Diagnosis not present

## 2019-10-22 DIAGNOSIS — M542 Cervicalgia: Secondary | ICD-10-CM | POA: Insufficient documentation

## 2019-10-23 DIAGNOSIS — M25512 Pain in left shoulder: Secondary | ICD-10-CM | POA: Diagnosis not present

## 2019-10-23 DIAGNOSIS — M9903 Segmental and somatic dysfunction of lumbar region: Secondary | ICD-10-CM | POA: Diagnosis not present

## 2019-10-23 DIAGNOSIS — M25511 Pain in right shoulder: Secondary | ICD-10-CM | POA: Diagnosis not present

## 2019-10-23 DIAGNOSIS — M5431 Sciatica, right side: Secondary | ICD-10-CM | POA: Diagnosis not present

## 2019-10-23 DIAGNOSIS — M5432 Sciatica, left side: Secondary | ICD-10-CM | POA: Diagnosis not present

## 2019-10-23 DIAGNOSIS — M9901 Segmental and somatic dysfunction of cervical region: Secondary | ICD-10-CM | POA: Diagnosis not present

## 2019-10-23 DIAGNOSIS — M542 Cervicalgia: Secondary | ICD-10-CM | POA: Diagnosis not present

## 2019-10-24 ENCOUNTER — Other Ambulatory Visit (HOSPITAL_COMMUNITY)
Admission: RE | Admit: 2019-10-24 | Discharge: 2019-10-24 | Disposition: A | Discharge status: Home / Self Care | Payer: Medicare Other | Source: Ambulatory Visit | Attending: Pulmonary Disease | Admitting: Pulmonary Disease

## 2019-10-24 DIAGNOSIS — Z01812 Encounter for preprocedural laboratory examination: Secondary | ICD-10-CM | POA: Insufficient documentation

## 2019-10-24 DIAGNOSIS — Z20828 Contact with and (suspected) exposure to other viral communicable diseases: Secondary | ICD-10-CM | POA: Diagnosis not present

## 2019-10-25 LAB — NOVEL CORONAVIRUS, NAA (HOSP ORDER, SEND-OUT TO REF LAB; TAT 18-24 HRS): SARS-CoV-2, NAA: NOT DETECTED

## 2019-10-27 ENCOUNTER — Other Ambulatory Visit: Payer: Self-pay | Admitting: Orthopedic Surgery

## 2019-10-27 DIAGNOSIS — M542 Cervicalgia: Secondary | ICD-10-CM

## 2019-10-28 ENCOUNTER — Encounter: Payer: Self-pay | Admitting: Pulmonary Disease

## 2019-10-28 ENCOUNTER — Ambulatory Visit (INDEPENDENT_AMBULATORY_CARE_PROVIDER_SITE_OTHER): Payer: Medicare Other | Admitting: Pulmonary Disease

## 2019-10-28 ENCOUNTER — Other Ambulatory Visit: Payer: Self-pay

## 2019-10-28 VITALS — BP 126/62 | HR 95 | Ht 67.0 in | Wt 187.0 lb

## 2019-10-28 DIAGNOSIS — R05 Cough: Secondary | ICD-10-CM | POA: Diagnosis not present

## 2019-10-28 DIAGNOSIS — J84112 Idiopathic pulmonary fibrosis: Secondary | ICD-10-CM

## 2019-10-28 DIAGNOSIS — R0609 Other forms of dyspnea: Secondary | ICD-10-CM

## 2019-10-28 DIAGNOSIS — R059 Cough, unspecified: Secondary | ICD-10-CM

## 2019-10-28 DIAGNOSIS — R0600 Dyspnea, unspecified: Secondary | ICD-10-CM

## 2019-10-28 LAB — PULMONARY FUNCTION TEST
DL/VA % pred: 107 %
DL/VA: 4.32 ml/min/mmHg/L
DLCO cor % pred: 84 %
DLCO cor: 19.26 ml/min/mmHg
DLCO unc % pred: 68 %
DLCO unc: 15.51 ml/min/mmHg
FEF 25-75 Post: 3.46 L/sec
FEF 25-75 Pre: 3.26 L/sec
FEF2575-%Change-Post: 6 %
FEF2575-%Pred-Post: 182 %
FEF2575-%Pred-Pre: 171 %
FEV1-%Change-Post: 0 %
FEV1-%Pred-Post: 98 %
FEV1-%Pred-Pre: 97 %
FEV1-Post: 2.6 L
FEV1-Pre: 2.59 L
FEV1FVC-%Change-Post: 3 %
FEV1FVC-%Pred-Pre: 117 %
FEV6-%Change-Post: -3 %
FEV6-%Pred-Post: 85 %
FEV6-%Pred-Pre: 88 %
FEV6-Post: 2.96 L
FEV6-Pre: 3.05 L
FEV6FVC-%Change-Post: 0 %
FEV6FVC-%Pred-Post: 107 %
FEV6FVC-%Pred-Pre: 107 %
FVC-%Change-Post: -3 %
FVC-%Pred-Post: 80 %
FVC-%Pred-Pre: 82 %
FVC-Post: 2.96 L
FVC-Pre: 3.05 L
Post FEV1/FVC ratio: 88 %
Post FEV6/FVC ratio: 100 %
Pre FEV1/FVC ratio: 85 %
Pre FEV6/FVC Ratio: 100 %
RV % pred: 101 %
RV: 2.45 L
TLC % pred: 81 %
TLC: 5.27 L

## 2019-10-28 NOTE — Progress Notes (Signed)
Subjective:    Patient ID: [name]., male    DOB: 16-Feb-1941, 78 y.o.   MRN: [id number]  Patient being seen for follow-up for pulmonary fibrosis  Admits to a cough, shortness of breath with activity  Breathing has been relatively stable Had a recent fall, has been having more falls recently Has extensive neuropathy  His breathing has been relatively stable, chronic cough is stable, does not appear to be getting more short of breath  He had a pulmonary function study done today, quite challenging  Did smoke in the past, quit about 28 years ago, less than a pack a day Other significant comorbidities  Denies any history of chronic skin rash No history of arthritis No history of inflammatory bowel disease No history of the vasculitis known to him  No chest pains or chest discomfort  He does have a history of swallowing difficulty Possibly undiagnosed reflux  Past Medical History:  Diagnosis Date  . (HFpEF) heart failure with preserved ejection fraction ([place])    a. 05/2013 Echo: EF 55%, mild LVH, diast dysfxn, Ao sclerosis, mildly dil LA, RV dysfxn (poorly visualized), PASP 56mmHg;  b. 03/2017 Echo: EF 55-60%, no rwma, triv MR, mildly dil RV, mod TR, PASP 54mmHg.  . Atrial fibrillation [facility])    s/p Cox Maze 1/09;  Multaq Rx d/c'd in 2014 due to pulmo fibrosis;  coumadin d/c'd in 2014 due to spontaneous subdural hematoma  . BPH (benign prostatic hyperplasia)   . CAD (coronary artery disease), native coronary artery    a. s/p CABG 12/2007;  b. Myoview 12/2011: EF 66%, no scar or ischemia; normal.  . DM (diabetes mellitus) ([place])   . Hyperlipidemia type II   . Hypertension   . 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 OSA (obstructive sleep apnea)   . Pacemaker    PPM - St. Jude  . Peripheral neuropathy 07/31/2018  . Pulmonary fibrosis ([place])    Multaq d/c'd 7/14  . Subdural hematoma ([place]) 07/2012   spontaneous;  coumadin d/c'd => no longer  a candidate for anticoagulation   Family History  Problem Relation Age of Onset  . Heart disease Father   . Heart attack Father   . Heart failure Father   . Heart disease Mother   . Alzheimer's disease Mother   . Dementia Mother    Reformed smoker No pertinent occupational history  Review of Systems  Constitutional: Positive for unexpected weight change. Negative for fever.  HENT: Positive for trouble swallowing. Negative for congestion, dental problem, ear pain, nosebleeds, postnasal drip, rhinorrhea, sinus pressure, sneezing and sore throat.   Eyes: Negative for redness and itching.  Respiratory: Positive for cough and shortness of breath. Negative for chest tightness and wheezing.   Cardiovascular: Positive for leg swelling. Negative for palpitations.  Gastrointestinal: Negative for nausea and vomiting.  Genitourinary: Negative for dysuria.  Musculoskeletal: Positive for joint swelling and neck pain.  Allergic/Immunologic: Negative.  Negative for environmental allergies, food allergies and immunocompromised state.      Objective:   Physical Exam Constitutional:      Appearance: Normal appearance.  HENT:     Head: Normocephalic and atraumatic.  Eyes:     General:        Right eye: No discharge.        Left eye: No discharge.     Extraocular Movements: Extraocular movements intact.     Pupils: Pupils are equal, round, and reactive to light.  Neck:     Musculoskeletal: Normal range of motion and neck supple.  Cardiovascular:     Rate and Rhythm: Normal rate.     Pulses: Normal pulses.     Heart sounds: Normal heart sounds. No murmur. No friction rub.  Pulmonary:     Effort: Pulmonary effort is normal. No respiratory distress.     Breath sounds: Normal breath sounds. No stridor. No wheezing, rhonchi or rales.  Abdominal:     General: There is no distension.     Palpations: There is no mass.     Tenderness: There is no abdominal tenderness.  Neurological:     Mental  Status: He is alert.    Vitals:   10/28/19 1218  BP: 126/62  Pulse: 95  SpO2: (!) 64%   PFT from 2014 shows no obstructive disease Repeat PFT performed today shows no obstruction, no significant restriction CT scan of the chest from 2016 shows mild interstitial lung disease Repeat high-resolution CT shows evidence of fibrosis craniocaudal gradient, basal bronchiectasis Echocardiogram did reveal cardiomegaly, preserved ejection fraction, diastolic dysfunction  Recent chest x-ray shows increased pulmonary markings concerning for pulmonary fibrosis .  Assessment & Plan:    .  Shortness of breath -Likely related to his pulmonary fibrosis -He has a good amount of deconditioning from his severe neuropathy document contributing to multiple falls  .  Chronic cough -This has been relatively stable  .  Pulmonary fibrosis -Some progression noted on his CT -Symptomatically, has not really changed significantly  .  Pulmonary function studies -shows no obstruction, no restriction, mild reduction in diffusing capacity  Plan:  .  Continue physical activity as tolerated  .  No other changes at present  .  We did discuss antifibrotic's, discussed his pulmonary function studies, discussed the possibility of progression, discussed side effects of antifibrotic -We agreed to present to continue monitoring -He will call with any significant changes in his symptoms -Most of the limitations he appears to be having recently is related to his neuropathy and multiple falls  .  We will see him back in about 6 months  .  Aspiration precautions as able  Encouraged to call with any significant concerns

## 2019-10-28 NOTE — Patient Instructions (Signed)
Pulmonary fibrosis  Stable symptoms are present  Your breathing study looks good as were reviewed  I will see you back in 6 months Call if you have any significant changes in symptoms-concerned about coughing and shortness of breath  Stay safe

## 2019-10-28 NOTE — Progress Notes (Signed)
Full PFT performed today. °

## 2019-10-30 ENCOUNTER — Telehealth: Payer: Self-pay | Admitting: Adult Health

## 2019-10-30 NOTE — Telephone Encounter (Signed)
Will route to NP to advise- patient has memory issues.  Thank you!

## 2019-10-30 NOTE — Telephone Encounter (Signed)
Patient's wife [name] called wanting to know if it would be ok for her to come with [name] to his appt on Monday 11/16.  He has memory issues and she comes with him to all his appts.

## 2019-10-31 ENCOUNTER — Ambulatory Visit
Admission: RE | Admit: 2019-10-31 | Discharge: 2019-10-31 | Disposition: A | Discharge status: Home / Self Care | Payer: Medicare Other | Source: Ambulatory Visit | Attending: Orthopedic Surgery | Admitting: Orthopedic Surgery

## 2019-10-31 DIAGNOSIS — S12501A Unspecified nondisplaced fracture of sixth cervical vertebra, initial encounter for closed fracture: Secondary | ICD-10-CM | POA: Diagnosis not present

## 2019-10-31 DIAGNOSIS — M542 Cervicalgia: Secondary | ICD-10-CM

## 2019-10-31 DIAGNOSIS — S12590A Other displaced fracture of sixth cervical vertebra, initial encounter for closed fracture: Secondary | ICD-10-CM | POA: Diagnosis not present

## 2019-11-02 NOTE — Progress Notes (Addendum)
Cardiology Office Note   Date:  11/03/2019   ID:  [name]., DOB 06/27/1941, MRN [id number]  PCP:  [name], MD  Cardiologist:  Lubertha South  No chief complaint on file.    History of Present Illness: [name]. is a 78 y.o. male who presents for ongoing assessment and management of coronary artery disease; CABG (LIMA to LAD, SVG to OM 2, SVG to PDA,); Cox-Maze IV procedure in January 2009.  Other history of atrial fibrillation, pacemaker implantation for sinus node dysfunction, and symptomatic bradycardia.  The patient was taken off of Coumadin in the setting of spontaneous subdural hematoma in August 2013 and required craniotomy and evacuation by Dr. Saintclair Halsted.  He is therefore no longer considered an anticoagulation candidate.  Is also noted that he is intolerant to Multaq, as it was felt this was causing pulmonary toxicity.  He has a diagnosis of moderate pulmonary fibrosis.  Patient also was diagnosed with thrombocytopenia and is being followed by oncology/hematology and is getting transfusions.  Additionally 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  He is also followed by neurology for mild dementia, vivid dreams, falling asleep very easily.  He had increased doses of Aricept prescribed.  Unfortunately this was causing increasing sleepiness and grogginess throughout the day.  As result he has been having multiple falls.  His torsemide was decreased to 20 mg twice daily, Flomax was decreased to 0.2 mg daily, and he was advised to decrease his Aricept back to 5 mg daily but follow-up with neurology for further recommendations.  On last office visit, the patient was continued on his current medication regimen, if he was to gain weight or have fluid retention or worsening breathing status he was to call us at which time adjustment in his diuretic will be  made.  He was continuing to have hypotension and I cautiously decreased his torsemide to 20 mg daily from torsemide twice daily but may have to go back up on dose if he was gaining weight or having symptoms of decompensated heart failure.  Neurology recommended to change in dose of metoprolol however recent pacemaker interrogation the patient was having episodes of atrial tachycardia although asymptomatic.  I did not reduce beta-blocker as he may have more exacerbations of atrial tachycardia which may become symptomatic once doses are reduced.  Mr. Stallworth comes today having had 2 more falls since being seen last.  He states one when he was standing for several minutes, and one while he was sitting on the side of the bed with his legs hanging down.  The latter caused him to hit his head and injure his neck.  CT scan of the neck revealed a undisplaced fracture involving the lamina bilaterally at C6.  This was a new finding.  He also was found to have multilevel degenerative change without other acute fracture.  He is to follow-up with orthopedics.  He is wearing a neck brace now.  He states decrease in dose of torsemide has not changed his dizziness.  His lower extremity edema is stable.  He does not wear support hose all the time.  He did not reduce his Flomax to 0.2 mg as he states that it did cause a reduction in his urine flow and therefore went back up to the 0.4 mg capsule at bedtime.  Past Medical History:  Diagnosis Date   (HFpEF) heart  failure with preserved ejection fraction ([name])    a. 05/2013 Echo: EF 55%, mild LVH, diast dysfxn, Ao sclerosis, mildly dil LA, RV dysfxn (poorly visualized), PASP 63mmHg;  b. 03/2017 Echo: EF 55-60%, no rwma, triv MR, mildly dil RV, mod TR, PASP 41mmHg.   Atrial fibrillation ([name])    s/p Cox Maze 1/09;  Multaq Rx d/c'd in 2014 due to pulmo fibrosis;  coumadin d/c'd in 2014 due to spontaneous subdural hematoma   BPH (benign prostatic hyperplasia)    CAD  (coronary artery disease), native coronary artery    a. s/p CABG 12/2007;  b. Myoview 12/2011: EF 66%, no scar or ischemia; normal.   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 Jude   Peripheral neuropathy 07/31/2018   Pulmonary fibrosis ([place])    Multaq d/c'd 7/14   Subdural hematoma ([name]) 07/2012   spontaneous;  coumadin d/c'd => no longer a candidate for anticoagulation    Past Surgical History:  Procedure Laterality Date   AMPUTATION Left 05/17/2019   Procedure: AMPUTATION LEFT FOURTH TOE;  Surgeon: Meredith Pel, MD;  Location: [facility];  Service: Orthopedics;  Laterality: Left;   APPENDECTOMY     CHOLECYSTECTOMY     CORONARY ARTERY BYPASS GRAFT     x3 (left internal mammary artery to distal left anterior descending coronary artery, saphenous vain graft to second circumflex marginal branch, saphenous vain graft to posterior descending coronary artery, endoscopic saphenous vain harvest from right thigh) and modified Cox - Maze IV procedure.  [name],MD. Electronically signed CHO/MEDQ D: 01/09/2008/ JOB: [id number] cc:  [name]   CRANIOTOMY  07/30/2012   Procedure: CRANIOTOMY HEMATOMA EVACUATION SUBDURAL;  Surgeon: [name], MD;  Location: [facility];  Service: Neurosurgery;  Laterality: Right;  Right craniotomy for evacuation of subdural hematoma   FOOT SURGERY     HERNIA REPAIR     ORCHIECTOMY     Left  /  testicular cancer   PACEMAKER PLACEMENT     PPM - St. Jude   PPM GENERATOR CHANGEOUT N/A 06/25/2019   Procedure: PPM GENERATOR CHANGEOUT;  Surgeon: Evans Lance, MD;  Location: [facility];  Service: Cardiovascular;  Laterality: N/A;     Current Outpatient Medications  Medication Sig Dispense Refill   acetaminophen (TYLENOL) 325 MG tablet Take 1-2 tablets (325-650 mg  total) by mouth every 4 (four) hours as needed for mild pain. (Patient taking differently: Take 325-650 mg by mouth as needed for mild pain. )     allopurinol (ZYLOPRIM) 300 MG tablet Take 300 mg by mouth daily.      donepezil (ARICEPT) 10 MG tablet Take 1 tablet (10 mg total) by mouth at bedtime. 30 tablet 11   ferrous sulfate 325 (65 FE) MG tablet Take 1 tablet (325 mg total) by mouth daily with breakfast. 30 tablet 1   gabapentin (NEURONTIN) 300 MG capsule TAKE 1 CAPSULE BY MOUTH THREE TIMES A DAY 90 capsule 1   metoprolol tartrate (LOPRESSOR) 50 MG tablet Take 1 tablet (50 mg total) by mouth 2 (two) times daily. 60 tablet 1   mirabegron ER (MYRBETRIQ) 50 MG TB24 tablet Take 1 tablet (50 mg total) by mouth daily. 30 tablet 3   neomycin-bacitracin-polymyxin (NEOSPORIN) ointment Apply 1 application topically  daily as needed for wound care.      pantoprazole (PROTONIX) 40 MG tablet Take 40 mg by mouth daily.     potassium chloride SA (K-DUR) 20 MEQ tablet Take 1 tablet (20 mEq total) by mouth daily. 30 tablet 1   rosuvastatin (CRESTOR) 40 MG tablet Take 1 tablet (40 mg total) by mouth daily. (Patient taking differently: Take 40 mg by mouth every evening. ) 90 tablet 2   sertraline (ZOLOFT) 100 MG tablet Take 200 mg by mouth daily.      tamsulosin (FLOMAX) 0.4 MG CAPS capsule Take 0.4 mg by mouth at bedtime.      torsemide (DEMADEX) 20 MG tablet Take 20 mg by mouth daily.     Current Facility-Administered Medications  Medication Dose Route Frequency Provider Last Rate Last Dose   mupirocin ointment (BACTROBAN) 2 %   Topical BID Magnant, Charles L, PA-C        Allergies:   Lipitor [atorvastatin], Nsaids, Warfarin and related, and Enbrel [etanercept]    Social History:  The patient  reports that he quit smoking about 28 years ago. His smoking use included cigarettes. He has a 40.00 pack-year smoking history. He has never used smokeless tobacco. He reports that he does not drink  alcohol or use drugs.   Family History:  The patient's family history includes Alzheimer's disease in his mother; Dementia in his mother; Heart attack in his father; Heart disease in his father and mother; Heart failure in his father.    ROS: All other systems are reviewed and negative. Unless otherwise mentioned in H&P    PHYSICAL EXAM: VS:  BP (!) 80/50    Pulse 60    Temp (!) 97.2 F (36.2 C)    Ht 5\' 7"  (1.702 m)    Wt 188 lb (85.3 kg)    BMI 29.44 kg/m  , BMI Body mass index is 29.44 kg/m. GEN: Well nourished, well developed, in no acute distress HEENT: normal Neck: no JVD, carotid bruits, or masses Cardiac:RRR; 2/6 systolic murmur, heard best at the right sternal border,  rubs, or gallops,no edema  Respiratory:  Clear to auscultation bilaterally, normal work of breathing GI: soft, nontender, nondistended, + BS MS: no deformity or atrophy Skin: warm and dry, no rash venous key stasis skin changes.  Neuro:  Strength and sensation are intact Psych: euthymic mood, full affect   EKG: Not completed this office visit.  Recent Labs: 05/16/2019: B Natriuretic Peptide 289.1; TSH 2.022 09/09/2019: Magnesium 2.3 09/25/2019: ALT 20; BUN 20; Creatinine, Ser 1.37; Hemoglobin 9.2; Platelets 88; Potassium 3.6; Sodium 141    Lipid Panel    Component Value Date/Time   CHOL 103 05/22/2019 0338   CHOL 119 09/24/2017 0824   TRIG 58 05/22/2019 0338   HDL 40 (L) 05/22/2019 0338   HDL 42 09/24/2017 0824   CHOLHDL 2.6 05/22/2019 0338   VLDL 12 05/22/2019 0338   LDLCALC 51 05/22/2019 0338   LDLCALC 58 09/24/2017 0824   LDLDIRECT 154.2 10/20/2013 0846      Wt Readings from Last 3 Encounters:  11/03/19 188 lb (85.3 kg)  10/28/19 187 lb (84.8 kg)  10/09/19 186 lb 8 oz (84.6 kg)      Other studies Reviewed: Echocardiogram May 04, 2019 1. The left ventricle has low normal systolic function, with an ejection fraction of 50-55%. The cavity size was normal. Left ventricular diastolic Doppler  parameters are indeterminate. 2. LVEF is approximately 50 to 55% with septal hypokinesis. Poor. 3. The right  ventricle was not well visualized. The cavity was moderately enlarged. There is no increase in right ventricular wall thickness. 4. RV is difficult to visualize, even with Definity It appears dilated and function is at least mildly reduced. 5. The mitral valve is abnormal. Mild thickening of the mitral valve leaflet. 6. The tricuspid valve is grossly normal. Tricuspid valve regurgitation is severe. 7. The aortic valve was not well visualized. Mild thickening of the aortic valve. Mild calcification of the aortic valve. 8. The inferior vena cava was dilated in size with <50% respiratory variability. 9. The interatrial septum was not assessed.   ASSESSMENT AND PLAN:  1.  Hypotension: He remains hypotensive today in the office.  May need to consider meclizine.  Most of his falls are related to lengthy periods of standing or sitting and then going to a standing position.  He is advised to avoid long periods of standing, wear support hose, and also to be careful going from sitting to standing, with no sudden change in position.  I am considering stopping the torsemide however he does have periods of time when he does have decompensated CHF, he loves [place] food and his wife's cooking which does sometimes have salt.  I have advised him to continue to weigh daily.  May need to consider further changes on follow-up.  2.  Atrial fibrillation with  tachycardia: St. Jude interrogation of his pacemaker did show periods of atrial tachycardia and therefore do not want to decrease metoprolol dose which is now 50 mg twice daily.  Can consider decreasing the p.m. dose to 25 mg, if he continues to have hypotension.  However we may risk worsening atrial tachycardia. Not on anticoagulation due to falls.   3.  Pacemaker in situ: Most recent pacemaker interrogation on 09/24/2019.  This revealed normal  device function, no programming changes were made, no high ventricular rates were noted.  4.  Hypercholesterolemia: Remains on rosuvastatin 40 mg daily.  No changes.  5. Frequent Falls: I have signed documents for wheel chair so that he may have additional level of safety for ambulatory assistance.Mr. Dinh mobility limitations which cannot be resolved with a cane, crutch, or walker. Patient can safely self propel the lightweight wheelchair but not a heavier standard weight wheelchair or has a caregiver who can assist at all times.   Current medicines are reviewed at length with the patient today.    Labs/ tests ordered today include: None Phill Myron. West Pugh, ANP, AACC   11/03/2019 4:06 PM    [facility] [place] [address] Office [phone] Fax [phone]  Notice: This dictation was prepared with Dragon dictation along with smaller phrase technology. Any transcriptional errors that result from this process are unintentional and may not be corrected upon review.

## 2019-11-02 NOTE — Telephone Encounter (Signed)
Of course she can come. It will be helpful

## 2019-11-03 ENCOUNTER — Other Ambulatory Visit: Payer: Self-pay

## 2019-11-03 ENCOUNTER — Encounter: Payer: Self-pay | Admitting: Adult Health

## 2019-11-03 ENCOUNTER — Ambulatory Visit (INDEPENDENT_AMBULATORY_CARE_PROVIDER_SITE_OTHER): Payer: Medicare Other | Admitting: Adult Health

## 2019-11-03 VITALS — BP 80/50 | HR 60 | Temp 97.2°F | Ht 67.0 in | Wt 188.0 lb

## 2019-11-03 DIAGNOSIS — I50812 Chronic right heart failure: Secondary | ICD-10-CM | POA: Diagnosis not present

## 2019-11-03 DIAGNOSIS — I6330 Cerebral infarction due to thrombosis of unspecified cerebral artery: Secondary | ICD-10-CM | POA: Diagnosis not present

## 2019-11-03 DIAGNOSIS — Z9181 History of falling: Secondary | ICD-10-CM

## 2019-11-03 DIAGNOSIS — I480 Paroxysmal atrial fibrillation: Secondary | ICD-10-CM

## 2019-11-03 DIAGNOSIS — W19XXXA Unspecified fall, initial encounter: Secondary | ICD-10-CM

## 2019-11-03 DIAGNOSIS — R531 Weakness: Secondary | ICD-10-CM | POA: Diagnosis not present

## 2019-11-03 DIAGNOSIS — I950 Idiopathic hypotension: Secondary | ICD-10-CM | POA: Diagnosis not present

## 2019-11-03 DIAGNOSIS — E7800 Pure hypercholesterolemia, unspecified: Secondary | ICD-10-CM | POA: Diagnosis not present

## 2019-11-03 NOTE — Telephone Encounter (Signed)
Placed a note into the notes tab- allowing visitor to accompany.

## 2019-11-03 NOTE — Patient Instructions (Addendum)
Medication Instructions:  Continue current medications  *If you need a refill on your cardiac medications before your next appointment, please call your pharmacy*  Lab Work: None Ordered  Testing/Procedures: None Ordered  Follow-Up: At [facility], you and your health needs are our priority.  As part of our continuing mission to provide you with exceptional heart care, we have created designated Provider Care Teams.  These Care Teams include your primary Cardiologist (physician) and Advanced Practice Providers (APPs -  Physician Assistants and Nurse Practitioners) who all work together to provide you with the care you need, when you need it.  Your next appointment:   3 months  The format for your next appointment:   In Person  Provider:   Brian Crenshaw, MD   

## 2019-11-04 DIAGNOSIS — M542 Cervicalgia: Secondary | ICD-10-CM | POA: Diagnosis not present

## 2019-11-05 DIAGNOSIS — M542 Cervicalgia: Secondary | ICD-10-CM | POA: Diagnosis not present

## 2019-11-05 DIAGNOSIS — S128XXD Fracture of other parts of neck, subsequent encounter: Secondary | ICD-10-CM | POA: Diagnosis not present

## 2019-11-06 ENCOUNTER — Encounter: Payer: Self-pay | Admitting: Orthopedic Surgery

## 2019-11-06 ENCOUNTER — Ambulatory Visit (INDEPENDENT_AMBULATORY_CARE_PROVIDER_SITE_OTHER): Payer: Medicare Other | Admitting: Orthopedic Surgery

## 2019-11-06 ENCOUNTER — Other Ambulatory Visit: Payer: Self-pay

## 2019-11-06 VITALS — Ht 67.0 in | Wt 188.0 lb

## 2019-11-06 DIAGNOSIS — I6330 Cerebral infarction due to thrombosis of unspecified cerebral artery: Secondary | ICD-10-CM | POA: Diagnosis not present

## 2019-11-06 DIAGNOSIS — I872 Venous insufficiency (chronic) (peripheral): Secondary | ICD-10-CM | POA: Diagnosis not present

## 2019-11-07 ENCOUNTER — Other Ambulatory Visit: Payer: Self-pay | Admitting: Hematology and Oncology

## 2019-11-07 ENCOUNTER — Inpatient Hospital Stay: Payer: Medicare Other

## 2019-11-07 ENCOUNTER — Encounter: Payer: Self-pay | Admitting: Hematology and Oncology

## 2019-11-07 ENCOUNTER — Inpatient Hospital Stay: Payer: Medicare Other | Attending: Hematology and Oncology | Admitting: Hematology and Oncology

## 2019-11-07 ENCOUNTER — Other Ambulatory Visit: Payer: Self-pay

## 2019-11-07 DIAGNOSIS — R162 Hepatomegaly with splenomegaly, not elsewhere classified: Secondary | ICD-10-CM | POA: Diagnosis not present

## 2019-11-07 DIAGNOSIS — D696 Thrombocytopenia, unspecified: Secondary | ICD-10-CM | POA: Diagnosis not present

## 2019-11-07 DIAGNOSIS — D509 Iron deficiency anemia, unspecified: Secondary | ICD-10-CM

## 2019-11-07 DIAGNOSIS — Z79899 Other long term (current) drug therapy: Secondary | ICD-10-CM | POA: Insufficient documentation

## 2019-11-07 DIAGNOSIS — I6330 Cerebral infarction due to thrombosis of unspecified cerebral artery: Secondary | ICD-10-CM | POA: Diagnosis not present

## 2019-11-07 DIAGNOSIS — D472 Monoclonal gammopathy: Secondary | ICD-10-CM | POA: Insufficient documentation

## 2019-11-07 DIAGNOSIS — D539 Nutritional anemia, unspecified: Secondary | ICD-10-CM

## 2019-11-07 LAB — CBC WITH DIFFERENTIAL/PLATELET
Abs Immature Granulocytes: 0.02 10*3/uL (ref 0.00–0.07)
Basophils Absolute: 0.1 10*3/uL (ref 0.0–0.1)
Basophils Relative: 2 %
Eosinophils Absolute: 0.1 10*3/uL (ref 0.0–0.5)
Eosinophils Relative: 3 %
HCT: 36 % — ABNORMAL LOW (ref 39.0–52.0)
Hemoglobin: 11.1 g/dL — ABNORMAL LOW (ref 13.0–17.0)
Immature Granulocytes: 1 %
Lymphocytes Relative: 20 %
Lymphs Abs: 0.7 10*3/uL (ref 0.7–4.0)
MCH: 31 pg (ref 26.0–34.0)
MCHC: 30.8 g/dL (ref 30.0–36.0)
MCV: 100.6 fL — ABNORMAL HIGH (ref 80.0–100.0)
Monocytes Absolute: 0.2 10*3/uL (ref 0.1–1.0)
Monocytes Relative: 6 %
Neutro Abs: 2.3 10*3/uL (ref 1.7–7.7)
Neutrophils Relative %: 68 %
Platelets: 88 10*3/uL — ABNORMAL LOW (ref 150–400)
RBC: 3.58 MIL/uL — ABNORMAL LOW (ref 4.22–5.81)
RDW: 15.3 % (ref 11.5–15.5)
WBC: 3.3 10*3/uL — ABNORMAL LOW (ref 4.0–10.5)
nRBC: 0 % (ref 0.0–0.2)

## 2019-11-07 LAB — IRON AND TIBC
Iron: 46 ug/dL (ref 42–163)
Saturation Ratios: 16 % — ABNORMAL LOW (ref 20–55)
TIBC: 284 ug/dL (ref 202–409)
UIBC: 238 ug/dL (ref 117–376)

## 2019-11-07 LAB — FERRITIN: Ferritin: 254 ng/mL (ref 24–336)

## 2019-11-07 LAB — VITAMIN B12: Vitamin B-12: 481 pg/mL (ref 180–914)

## 2019-11-07 NOTE — Assessment & Plan Note (Signed)
His last myeloma panel was just borderline abnormal He will continue [name] blood work once a year

## 2019-11-07 NOTE — Assessment & Plan Note (Signed)
He has received intravenous iron infusion with excellent response to treatment I suspect his current symptom of fatigue, shortness of breath, etc. is unrelated to the anemia, rather related to his other medical comorbidities I will see him again in 6 months for further follow-up

## 2019-11-07 NOTE — Progress Notes (Signed)
[name] OFFICE PROGRESS NOTE  Patient Care Team: [name], [name] [name], [name], [name], DO as Referring Physician (Family Medicine)  ASSESSMENT & PLAN:  Iron deficiency anemia He has received intravenous iron infusion with excellent response to treatment I suspect his current symptom of fatigue, shortness of breath, etc. 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 Even though the CT imaging report from September 2017 did not disclose this, I reviewed the imaging study with the patient extensively which clearly showed mild liver enlargement and splenomegaly. Rarely, autoimmune disorder that could cause pulmonary fibrosis can also cause mild thrombocytopenia. He is not symptomatic.  Previous autoimmune screen, hepatitis C screening and serum vitamin B12 were within normal limits Observe only for now  [name] (monoclonal gammopathy of unknown significance) His last myeloma panel was just borderline abnormal He will continue [name] blood work once a year   No orders of the defined types were placed in this encounter.   INTERVAL HISTORY: Please see below for problem oriented charting. He returns with his wife for further follow-up The patient continues to have difficulties with shortness of breath and fatigue despite intravenous iron He had recurrent falls No recent infection, fever or chills The patient denies any recent signs or symptoms of bleeding such as spontaneous epistaxis, hematuria or hematochezia.   SUMMARY OF ONCOLOGIC HISTORY:  Jamie G Paulo Brooke Bonito. is here because of thrombocytopenia. 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  He was found to have abnormal CBC from abnormal blood work from routine blood work monitoring. I have reviewed outside records from his primary care doctor.  He is noted to have mild pancytopenia since 11/17/2015. On 11/17/2015, white blood cell count 5.9, hemoglobin 12.7 and platelet count 106 On 03/13/2016, white blood cell count 6.6, hemoglobin 12.6 and platelet count 103 On 09/06/2015, white blood cell count 6.6, hemoglobin 11.9 and platelet count 102 On 10/25/2016, white count 5.2, hemoglobin 12.7 and platelet count 86 On 11/08/16, white blood cell count 5.3, hemoglobin 12.2 implant, 107. On 09/05/2016, serum ferritin was high at 220, iron study showed serum iron low at 24 and 9% iron saturation.  Some older scanned records dated back to 12/13/2011 showed low platelet count of 101 On 12/21/2011, he had normal platelet count of 210 On 07/30/2012, his platelet count was borderline low at 143  On 09/08/2016, [name], such as spontaneous epistaxis, hematuria, 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  He is being observed Repeat CT imaging  of the abdomen and pelvis in August 2019 confirmed liver cirrhosis and  splenomegaly He was found to have iron deficiency anemia and was placed on oral iron supplement The patient also have [name] and is being observed In October, he received intravenous iron infusion  REVIEW OF SYSTEMS:   Constitutional: Denies fevers, chills or abnormal weight loss Eyes: Denies blurriness of vision Ears, nose, mouth, throat, and face: Denies mucositis or sore throat Respiratory: Denies cough, dyspnea or wheezes Cardiovascular: Denies palpitation, chest discomfort or lower extremity swelling Gastrointestinal:  Denies nausea, heartburn or change in bowel habits Lymphatics: Denies new lymphadenopathy or easy bruising Behavioral/Psych: Mood is stable, no new changes  All other systems were reviewed with the patient and are negative.  I have reviewed the past medical history, past surgical history, social history and family history with the patient and they are unchanged from previous note.  ALLERGIES:  is allergic to lipitor [atorvastatin]; nsaids; warfarin and related; and enbrel [etanercept].  MEDICATIONS:  Current Outpatient Medications  Medication Sig Dispense Refill  . acetaminophen (TYLENOL) 325 MG tablet Take 1-2 tablets (325-650 mg total) by mouth every 4 (four) hours as needed for mild pain. (Patient taking differently: Take 325-650 mg by mouth as needed for mild pain. )    . allopurinol (ZYLOPRIM) 300 MG tablet Take 300 mg by mouth daily.     [name] donepezil (ARICEPT) 10 MG tablet Take 1 tablet (10 mg total) by mouth at bedtime. 30 tablet 11  . ferrous sulfate 325 (65 FE) MG tablet Take 1 tablet (325 mg total) by mouth daily with breakfast. 30 tablet 1  . gabapentin (NEURONTIN) 300 MG capsule TAKE 1 CAPSULE BY MOUTH THREE TIMES A DAY 90 capsule 1  . metoprolol tartrate (LOPRESSOR) 50 MG tablet Take 1 tablet (50 mg total) by mouth 2 (two) times daily. 60 tablet 1  . mirabegron ER (MYRBETRIQ) 50 MG TB24 tablet Take 1 tablet (50 mg total) by mouth daily. 30 tablet 3  .  neomycin-bacitracin-polymyxin (NEOSPORIN) ointment Apply 1 application topically daily as needed for wound care.     . pantoprazole (PROTONIX) 40 MG tablet Take 40 mg by mouth daily.    . potassium chloride SA (K-DUR) 20 MEQ tablet Take 1 tablet (20 mEq total) by mouth daily. 30 tablet 1  . rosuvastatin (CRESTOR) 40 MG tablet Take 1 tablet (40 mg total) by mouth daily. (Patient taking differently: Take 40 mg by mouth every evening. ) 90 tablet 2  . sertraline (ZOLOFT) 100 MG tablet Take 200 mg by mouth daily.     . tamsulosin (FLOMAX) 0.4 MG CAPS capsule Take 0.4 mg by mouth at bedtime.     . torsemide (DEMADEX) 20 MG tablet Take 20 mg by mouth daily.     Current Facility-Administered Medications  Medication Dose Route Frequency Provider Last Rate Last Dose  . mupirocin ointment (BACTROBAN) 2 %   Topical BID Magnant, Charles L, PA-C        PHYSICAL EXAMINATION: ECOG PERFORMANCE STATUS: 1 - Symptomatic but completely ambulatory  Vitals:   11/07/19 1014  BP: (!) 107/53  Pulse: (!) 56  Resp: 18  Temp: 98 F (36.7 C)  SpO2: 100%   Filed Weights   11/07/19 1014  Weight: 187 lb 4.8 oz (85 kg)    GENERAL:alert, no distress and comfortable SKIN: skin color, texture, turgor are normal, no rashes or significant lesions.  Noted chronic skin bruises NEURO: alert & oriented x 3 with fluent speech, no focal  motor/sensory deficits  LABORATORY DATA:  I have reviewed the data as listed    Component Value Date/Time   NA 141 09/25/2019 0915   NA 146 (H) 09/08/2019 1602   NA 141 02/23/2017 1237   K 3.6 09/25/2019 0915   K 3.8 02/23/2017 1237   CL 108 09/25/2019 0915   CO2 25 09/25/2019 0915   CO2 27 02/23/2017 1237   GLUCOSE 81 09/25/2019 0915   GLUCOSE 113 02/23/2017 1237   BUN 20 09/25/2019 0915   BUN 30 (H) 09/08/2019 1602   BUN 18.3 02/23/2017 1237   CREATININE 1.37 (H) 09/25/2019 0915   CREATININE 1.2 02/23/2017 1237   CALCIUM 8.7 (L) 09/25/2019 0915   CALCIUM 9.6 02/23/2017  1237   PROT 7.6 09/25/2019 0915   PROT 7.3 06/18/2018 1205   PROT 8.0 02/23/2017 1237   ALBUMIN 3.8 09/25/2019 0915   ALBUMIN 4.1 09/24/2017 0824   ALBUMIN 4.1 02/23/2017 1237   AST 31 09/25/2019 0915   AST 24 02/23/2017 1237   ALT 20 09/25/2019 0915   ALT 15 02/23/2017 1237   ALKPHOS 113 09/25/2019 0915   ALKPHOS 105 02/23/2017 1237   BILITOT 0.5 09/25/2019 0915   BILITOT 0.6 09/24/2017 0824   BILITOT 0.98 02/23/2017 1237   GFRNONAA 50 (L) 09/25/2019 0915   GFRAA 58 (L) 09/25/2019 0915    No results found for: SPEP, UPEP  Lab Results  Component Value Date   WBC 3.3 (L) 11/07/2019   NEUTROABS 2.3 11/07/2019   HGB 11.1 (L) 11/07/2019   HCT 36.0 (L) 11/07/2019   MCV 100.6 (H) 11/07/2019   PLT 88 (L) 11/07/2019      Chemistry      Component Value Date/Time   NA 141 09/25/2019 0915   NA 146 (H) 09/08/2019 1602   NA 141 02/23/2017 1237   K 3.6 09/25/2019 0915   K 3.8 02/23/2017 1237   CL 108 09/25/2019 0915   CO2 25 09/25/2019 0915   CO2 27 02/23/2017 1237   BUN 20 09/25/2019 0915   BUN 30 (H) 09/08/2019 1602   BUN 18.3 02/23/2017 1237   CREATININE 1.37 (H) 09/25/2019 0915   CREATININE 1.2 02/23/2017 1237      Component Value Date/Time   CALCIUM 8.7 (L) 09/25/2019 0915   CALCIUM 9.6 02/23/2017 1237   ALKPHOS 113 09/25/2019 0915   ALKPHOS 105 02/23/2017 1237   AST 31 09/25/2019 0915   AST 24 02/23/2017 1237   ALT 20 09/25/2019 0915   ALT 15 02/23/2017 1237   BILITOT 0.5 09/25/2019 0915   BILITOT 0.6 09/24/2017 0824   BILITOT 0.98 02/23/2017 1237       RADIOGRAPHIC STUDIES: I have personally reviewed the radiological images as listed and agreed with the findings in the report. Ct Cervical Spine Wo Contrast  Result Date: 10/31/2019 CLINICAL DATA:  History of multiple recent falls with neck pain, initial encounter EXAM: CT CERVICAL SPINE WITHOUT CONTRAST TECHNIQUE: Multidetector CT imaging of the cervical spine was performed without intravenous contrast.  Multiplanar CT image reconstructions were also generated. COMPARISON:  07/06/2019 FINDINGS: Alignment: No acute abnormality noted. Skull base and vertebrae: 7 cervical segments are noted. Partial fusion at C5-6 is noted in the vertebral bodies. No definitive posterior element fusion is seen. There is an undisplaced fracture through the lamina bilaterally at C6 new from the prior exam. No significant displacement is noted. No definitive instability is seen. Multilevel facet hypertrophic changes are seen. No other fracture is noted. Soft tissues and  spinal canal: Surrounding soft tissue structures show diffuse vascular calcification of the carotid arteries. No sizable hematoma is seen. Upper chest: Visualized lung apices are within normal limits. Other: None IMPRESSION: Undisplaced fracture involving the lamina bilaterally at C6. This is new from the prior exam. Multilevel degenerative change without other acute fracture. These results will be called to the ordering clinician or representative by the Radiologist Assistant, and communication documented in the PACS or zVision Dashboard. Electronically Signed   By: Inez Catalina M.D.   On: 10/31/2019 19:23    All questions were answered. The patient knows to call the clinic with any problems, questions or concerns. No barriers to learning was detected.  I spent 10 minutes counseling the patient face to face. The total time spent in the appointment was 15 minutes and more than 50% was on counseling and review of test results  [name], MD 11/07/2019 10:23 AM

## 2019-11-07 NOTE — Assessment & Plan Note (Signed)
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 Even though the CT imaging report from September 2017 did not disclose this, I reviewed the imaging study with the patient extensively which clearly showed mild liver enlargement and splenomegaly. Rarely, autoimmune disorder that could cause pulmonary fibrosis can also cause mild thrombocytopenia. He is not symptomatic.  Previous autoimmune screen, hepatitis C screening and serum vitamin B12 were within normal limits Observe only for now 

## 2019-11-08 LAB — ERYTHROPOIETIN: Erythropoietin: 19.4 m[IU]/mL — ABNORMAL HIGH (ref 2.6–18.5)

## 2019-11-09 LAB — FOLATE RBC
Folate, Hemolysate: 370 ng/mL
Folate, RBC: 1091 ng/mL (ref >498)
Hematocrit: 33.9 % — ABNORMAL LOW (ref 37.5–51.0)

## 2019-11-16 ENCOUNTER — Encounter: Payer: Self-pay | Admitting: Neurology

## 2019-11-18 ENCOUNTER — Encounter: Payer: Self-pay | Admitting: Adult Health

## 2019-11-18 ENCOUNTER — Ambulatory Visit (INDEPENDENT_AMBULATORY_CARE_PROVIDER_SITE_OTHER): Payer: Medicare Other | Admitting: Adult Health

## 2019-11-18 ENCOUNTER — Other Ambulatory Visit: Payer: Self-pay

## 2019-11-18 ENCOUNTER — Encounter: Payer: Self-pay | Admitting: Orthopedic Surgery

## 2019-11-18 VITALS — BP 106/70 | HR 100 | Temp 97.0°F | Ht 67.0 in | Wt 193.4 lb

## 2019-11-18 DIAGNOSIS — I6330 Cerebral infarction due to thrombosis of unspecified cerebral artery: Secondary | ICD-10-CM

## 2019-11-18 DIAGNOSIS — G4719 Other hypersomnia: Secondary | ICD-10-CM

## 2019-11-18 DIAGNOSIS — R413 Other amnesia: Secondary | ICD-10-CM

## 2019-11-18 DIAGNOSIS — G4733 Obstructive sleep apnea (adult) (pediatric): Secondary | ICD-10-CM

## 2019-11-18 DIAGNOSIS — Z9989 Dependence on other enabling machines and devices: Secondary | ICD-10-CM

## 2019-11-18 DIAGNOSIS — R443 Hallucinations, unspecified: Secondary | ICD-10-CM

## 2019-11-18 NOTE — Progress Notes (Signed)
PATIENT: [name]. DOB: 03-15-1941  REASON FOR VISIT: follow up HISTORY FROM: patient  HISTORY OF PRESENT ILLNESS: Today 11/18/19:  [name] is a 78 year old male with a history of obstructive sleep apnea on CPAP.  His download indicates that he uses machine 18 out of 30 days for compliance of 60%.  He uses machine greater than 4 hours 11 days for compliance of 37%.  On average he uses his machine 4 hours and 55 minutes.  His residual AHI is 1.6 on 6 to 16 cm of water with EPR 3.  His leak in the 95th percentile is 35.7 L/min.  Wife reports that he tends to fall asleep easily during the day.  She states that they can be out at dinner and he will fall asleep while eating.  Sometimes his hand will fall in his food.  She states that typically when he wakes up he begins having hallucinations.  She states he also has this during the night.  She is concerned that he has narcolepsy considering how easy it is for him to go to sleep.  He does not operate a motor vehicle.  Patient states that he will fall asleep in the car easily.  In the past they called in about the hallucinations and was advised by Dr. Jannifer Franklin to move Aricept to the a.m.  They have done that however now he has these hallucinations during the day and at night.  He returns today for an evaluation.  HISTORY 08/14/19:  [name] is a 78 year old male with a history of obstructive sleep apnea on CPAP, stroke and memory disturbance.  He was last seen in July by Dr. Jannifer Franklin following a stroke event.  The patient and his wife is concerned as he is sleeping a lot during the day.  His CPAP download indicates that he uses machine 59 out of 90 days for compliance of 66%.  He uses machine greater than 4 hours only 47 days for compliance of 52%.  On average he uses his machine 6 hours and 27 minutes.  His residual AHI is 1.9 on 6 to 16 cm of water with EPR 3.  His leak in the 95th percentile is 31.3 L/min.  The patient states that sometimes  he does not go to bed till 2 or 4 AM and then will be back up at 6 AM.  When the patient sleeps during the day he is not using his CPAP.  The wife reports that sometimes when he wakes up he will have hallucinations but then these resolved.  He is currently in physical therapy after his stroke.  He did have a fall last night and suffered an abrasion on his left knee and right elbow.  The wife states that he may have bumped his head but it was nothing significant.  She denies any changes in his mental status.  He is not on any blood thinners.  Patient returns today for evaluation.  REVIEW OF SYSTEMS: Out of a complete 14 system review of symptoms, the patient complains only of the following symptoms, and all other reviewed systems are negative.  See HPI   ALLERGIES: Allergies  Allergen Reactions   Lipitor [Atorvastatin] Other (See Comments)    Stiff joints   Nsaids Other (See Comments)    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   Warfarin And Related Other (See Comments)    Stiff joints   Enbrel [Etanercept] Itching    HOME MEDICATIONS: Outpatient Medications Prior  to Visit  Medication Sig Dispense Refill   acetaminophen (TYLENOL) 325 MG tablet Take 1-2 tablets (325-650 mg total) by mouth every 4 (four) hours as needed for mild pain. (Patient taking differently: Take 325-650 mg by mouth as needed for mild pain. )     allopurinol (ZYLOPRIM) 300 MG tablet Take 300 mg by mouth daily.      donepezil (ARICEPT) 10 MG tablet Take 1 tablet (10 mg total) by mouth at bedtime. 30 tablet 11   ferrous sulfate 325 (65 FE) MG tablet Take 1 tablet (325 mg total) by mouth daily with breakfast. 30 tablet 1   gabapentin (NEURONTIN) 300 MG capsule TAKE 1 CAPSULE BY MOUTH THREE TIMES A DAY 90 capsule 1   metoprolol tartrate (LOPRESSOR) 50 MG tablet Take 1 tablet (50 mg total) by mouth 2 (two) times daily. 60 tablet 1   mirabegron ER (MYRBETRIQ) 50 MG TB24 tablet Take 1 tablet (50 mg total) by mouth daily. 30  tablet 3   neomycin-bacitracin-polymyxin (NEOSPORIN) ointment Apply 1 application topically daily as needed for wound care.      pantoprazole (PROTONIX) 40 MG tablet Take 40 mg by mouth daily.     potassium chloride SA (K-DUR) 20 MEQ tablet Take 1 tablet (20 mEq total) by mouth daily. 30 tablet 1   rosuvastatin (CRESTOR) 40 MG tablet Take 1 tablet (40 mg total) by mouth daily. (Patient taking differently: Take 40 mg by mouth every evening. ) 90 tablet 2   sertraline (ZOLOFT) 100 MG tablet Take 200 mg by mouth daily.      tamsulosin (FLOMAX) 0.4 MG CAPS capsule Take 0.4 mg by mouth at bedtime.      torsemide (DEMADEX) 20 MG tablet Take 20 mg by mouth daily.     Facility-Administered Medications Prior to Visit  Medication Dose Route Frequency Provider Last Rate Last Dose   mupirocin ointment (BACTROBAN) 2 %   Topical BID Magnant, Gerrianne Scale, PA-C        PAST MEDICAL HISTORY: Past Medical History:  Diagnosis Date   (HFpEF) heart failure with preserved ejection fraction ([place])    a. 05/2013 Echo: EF 55%, mild LVH, diast dysfxn, Ao sclerosis, mildly dil LA, RV dysfxn (poorly visualized), PASP 15mHg;  b. 03/2017 Echo: EF 55-60%, no rwma, triv MR, mildly dil RV, mod TR, PASP 48mg.   Atrial fibrillation ([facility]   s/p Cox Maze 1/09;  Multaq Rx d/c'd in 2014 due to pulmo fibrosis;  coumadin d/c'd in 2014 due to spontaneous subdural hematoma   BPH (benign prostatic hyperplasia)    CAD (coronary artery disease), native coronary artery    a. s/p CABG 12/2007;  b. Myoview 12/2011: EF 66%, no scar or ischemia; normal.   DM (diabetes mellitus) ([facility] [place]   Hyperlipidemia type II    Hypertension    [name] (monoclonal gammopathy of unknown significance) 07/31/2018   IgA   OSA (obstructive sleep apnea)    Pacemaker    PPM - St. Jude   Peripheral neuropathy 07/31/2018   Pulmonary fibrosis ([facility]   Multaq d/c'd 7/14   Subdural hematoma ([facility]08/2013   spontaneous;  coumadin d/c'd => no longer  a candidate for anticoagulation    PAST SURGICAL HISTORY: Past Surgical History:  Procedure Laterality Date   AMPUTATION Left 05/17/2019   Procedure: AMPUTATION LEFT FOURTH TOE;  Surgeon: DeMeredith PelMD;  [facility] Service: Orthopedics;  Laterality: Left;   APPENDECTOMY     CHOLECYSTECTOMY  CORONARY ARTERY BYPASS GRAFT     x3 (left internal mammary artery to distal left anterior descending coronary artery, saphenous vain graft to second circumflex marginal branch, saphenous vain graft to posterior descending coronary artery, endoscopic saphenous vain harvest from right thigh) and modified Cox - Maze IV procedure.  [name],MD. Electronically signed CHO/MEDQ D: 01/09/2008/ JOB: [id number] cc:  [name]   CRANIOTOMY  07/30/2012   Procedure: CRANIOTOMY HEMATOMA EVACUATION SUBDURAL;  Surgeon: [name], MD;  Location: [facility];  Service: Neurosurgery;  Laterality: Right;  Right craniotomy for evacuation of subdural hematoma   FOOT SURGERY     HERNIA REPAIR     ORCHIECTOMY     Left  /  testicular cancer   PACEMAKER PLACEMENT     PPM - St. Jude   PPM GENERATOR CHANGEOUT N/A 06/25/2019   Procedure: PPM GENERATOR CHANGEOUT;  Surgeon: Evans Lance, MD;  Location: [facility];  Service: Cardiovascular;  Laterality: N/A;    FAMILY HISTORY: Family History  Problem Relation Age of Onset   Heart disease Father    Heart attack Father    Heart failure Father    Heart disease Mother    Alzheimer's disease Mother    Dementia Mother     SOCIAL HISTORY: Social History   Socioeconomic History   Marital status: Married    Spouse name: Immunologist   Number of children: 2   Years of education: Not on file   Highest education level: Not on file  Occupational History   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: Not on file   Food insecurity    Worry: Not on file    Inability: Not on Energy manager needs    Medical: Not on file    Non-medical: Not on file  Tobacco Use   Smoking status: Former Smoker    Packs/day: 2.00    Years: 20.00    Pack years: 40.00    Types: Cigarettes    Quit date: 02/21/1991    Years since quitting: 28.7   Smokeless tobacco: Never Used  Substance and Sexual Activity   Alcohol use: No    Alcohol/week: 0.0 standard drinks   Drug use: No   Sexual activity: Not Currently  Lifestyle   Physical activity    Days per week: Not on file    Minutes per session: Not on file   Stress: Not on file  Relationships   Social connections    Talks on phone: Not on file    Gets together: Not on file    Attends religious service: Not on file    Active member of club or organization: Not on file    Attends meetings of clubs or organizations: Not on file    Relationship status: Not on file   Intimate partner violence    Fear of current or ex partner: Not on file    Emotionally abused: Not on file    Physically abused: Not on file    Forced sexual activity: Not on file  Other Topics Concern   Not on file  Social History Narrative   Lives with wife   Right handed    Married with two children.     He is a Engineer, structural.        PHYSICAL EXAM  Vitals:   11/18/19 0937  Weight: 193 lb 6.4 oz (87.7 kg)   Body mass index  is 30.29 kg/m. MMSE - Mini Mental State Exam 11/18/2019 08/14/2019 06/19/2019  Not completed: (No Data) (No Data) -  Orientation to time '4 2 5  ' Orientation to Place '4 4 3  ' Registration '3 3 3  ' Attention/ Calculation '4 5 5  ' Attention/Calculation-comments - - couldnt do numbers  Recall '3 2 2  ' Language- name 2 objects '2 2 2  ' Language- repeat '1 1 1  ' Language- follow 3 step command '3 3 3  ' Language- read & follow direction '1 1 1  ' Write a sentence '1 1 1  ' Copy design '1 1 1  ' Total score '27 25 27    ' Generalized: Well developed, in no acute distress   Neurological examination  Mentation: Alert oriented to time, place,  history taking. Follows all commands speech and language fluent Cranial nerve II-XII: Pupils were equal round reactive to light. Extraocular movements were full, visual field were full on confrontational test.  Head turning and shoulder shrug  were normal and symmetric. Motor: The motor testing reveals 5 over 5 strength of all 4 extremities. Good symmetric motor tone is noted throughout.  Sensory: Sensory testing is intact to soft touch on all 4 extremities. No evidence of extinction is noted.  Coordination: Cerebellar testing reveals good finger-nose-finger and heel-to-shin bilaterally.  Gait and station: Uses a Rollator when ambulating.   DIAGNOSTIC DATA (LABS, IMAGING, TESTING) - I reviewed patient records, labs, notes, testing and imaging myself where available.  Lab Results  Component Value Date   WBC 3.3 (L) 11/07/2019   HGB 11.1 (L) 11/07/2019   HCT 33.9 (L) 11/07/2019   MCV 100.6 (H) 11/07/2019   PLT 88 (L) 11/07/2019      Component Value Date/Time   NA 141 09/25/2019 0915   NA 146 (H) 09/08/2019 1602   NA 141 02/23/2017 1237   K 3.6 09/25/2019 0915   K 3.8 02/23/2017 1237   CL 108 09/25/2019 0915   CO2 25 09/25/2019 0915   CO2 27 02/23/2017 1237   GLUCOSE 81 09/25/2019 0915   GLUCOSE 113 02/23/2017 1237   BUN 20 09/25/2019 0915   BUN 30 (H) 09/08/2019 1602   BUN 18.3 02/23/2017 1237   CREATININE 1.37 (H) 09/25/2019 0915   CREATININE 1.2 02/23/2017 1237   CALCIUM 8.7 (L) 09/25/2019 0915   CALCIUM 9.6 02/23/2017 1237   PROT 7.6 09/25/2019 0915   PROT 7.3 06/18/2018 1205   PROT 8.0 02/23/2017 1237   ALBUMIN 3.8 09/25/2019 0915   ALBUMIN 4.1 09/24/2017 0824   ALBUMIN 4.1 02/23/2017 1237   AST 31 09/25/2019 0915   AST 24 02/23/2017 1237   ALT 20 09/25/2019 0915   ALT 15 02/23/2017 1237   ALKPHOS 113 09/25/2019 0915   ALKPHOS 105 02/23/2017 1237   BILITOT 0.5 09/25/2019 0915   BILITOT 0.6 09/24/2017 0824   BILITOT 0.98 02/23/2017 1237   GFRNONAA 50 (L)  09/25/2019 0915   GFRAA 58 (L) 09/25/2019 0915   Lab Results  Component Value Date   CHOL 103 05/22/2019   HDL 40 (L) 05/22/2019   LDLCALC 51 05/22/2019   LDLDIRECT 154.2 10/20/2013   TRIG 58 05/22/2019   CHOLHDL 2.6 05/22/2019   Lab Results  Component Value Date   HGBA1C 5.6 05/21/2019   Lab Results  Component Value Date   [id number] 443 11/07/2019   Lab Results  Component Value Date   TSH 2.022 05/16/2019      ASSESSMENT AND PLAN 79 y.o. year old male  has a past  medical history of (HFpEF) heart failure with preserved ejection fraction (HCC), Atrial fibrillation ([place]), BPH (benign prostatic hyperplasia), CAD (coronary artery disease), native coronary artery, DM (diabetes mellitus) ([place]), Hyperlipidemia type II, Hypertension, [name] (monoclonal gammopathy of unknown significance) (07/31/2018), OSA (obstructive sleep apnea), Pacemaker, Peripheral neuropathy (07/31/2018), Pulmonary fibrosis ([place]), and Subdural hematoma ([place]) (07/2012). here with:  1. Obstructive sleep apnea on CPAP 2. Daytime sleepiness 3. Hallucinations  The patient's CPAP download shows suboptimal compliance but good treatment of his apnea.  He is encouraged to continue using CPAP nightly and greater than 4 hours each night.  I discussed the patient's symptoms with Dr. Brett Fairy.  We will do HLA to test for narcolepsy.  The patient will do a trial off of Aricept for 2 weeks to see if his hallucinations resolved.  If there is no improvement we may consider adding Seroquel or Zyprexa.  He is advised that if his symptoms worsen or he develops new symptoms he should let us know.  He will follow-up in 4 to 5 months or sooner if needed    I spent 15 minutes with the patient. 50% of this time was spent reviewing plan of care   Ward Givens, MSN, NP-C 11/18/2019, 9:38 AM [facility] [address], [place], [place] [address] [phone]

## 2019-11-18 NOTE — Patient Instructions (Addendum)
Your Plan:  Continue using CPAP nightly  I will discuss with Dr. Brett Fairy about narcolepsy study Stop Aricept- if hallucinations do not improve call me in 2 weeks If your symptoms worsen or you develop new symptoms please let us know.   Thank you for coming to see [place] at [facility]. I hope we have been able to provide you high quality care today.  You may receive a patient satisfaction survey over the next few weeks. We would appreciate your feedback and comments so that we may continue to improve ourselves and the health of our patients.

## 2019-11-18 NOTE — Progress Notes (Signed)
Office Visit Note   Patient: [name].           Date of Birth: 02/19/1941           MRN: [id number] Visit Date: 11/06/2019              Requested by: [name], MD [address] [place],  [place] [address] PCP: [name], MD  Chief Complaint  Patient presents with  . Right Foot - Follow-up      HPI: Patient is a 78 year old gentleman who presents in follow-up for venous insufficiency ulceration and swelling.  Assessment & Plan: Visit Diagnoses:  1. Venous stasis dermatitis of both lower extremities     Plan: Patient is shown excellent improvement in the swelling and ulcerative healing.  We will reevaluate in 4 weeks continue with compression stockings.  Follow-Up Instructions: Return in about 4 weeks (around 12/04/2019).   Ortho Exam  Patient is alert, oriented, no adenopathy, well-dressed, normal affect, normal respiratory effort. Examination patient has developed a new superficial ulcer over the PIP joint of the right second toe with fixed clawing of the toe the ulcer is 5 x 7 mm in 0.1 mm deep there is no exposed bone or tendon.  The importance of extra-depth shoe wear was discussed to unload pressure from this claw toe.  The venous stasis swelling is improving and there are no open ulcers there is excellent healing of his venous ulcers.  Imaging: No results found. No images are attached to the encounter.  Labs: Lab Results  Component Value Date   HGBA1C 5.6 05/21/2019   HGBA1C 5.7 (H) 07/30/2012   HGBA1C  01/07/2008    5.3 (NOTE)   The ADA recommends the following therapeutic goals for glycemic   control related to Hgb A1C measurement:   Goal of Therapy:   < 7.0% Hgb A1C   Action Suggested:  > 8.0% Hgb A1C   Ref:  Diabetes Care, 22, Suppl. 1, 1999   ESRSEDRATE 40 (H) 05/01/2019   ESRSEDRATE 45 (H) 12/16/2018   ESRSEDRATE 18 06/18/2018   CRP <0.5 06/16/2013   LABURIC 3.9 12/16/2018   REPTSTATUS 05/20/2019 FINAL 05/15/2019   CULT   05/15/2019    NO GROWTH 5 DAYS Performed at [facility] [address] [place],  [address]      Lab Results  Component Value Date   ALBUMIN 3.8 09/25/2019   ALBUMIN 3.5 05/27/2019   ALBUMIN 3.2 (L) 05/26/2019   LABURIC 3.9 12/16/2018    Lab Results  Component Value Date   MG 2.3 09/09/2019   MG 2.0 05/26/2019   MG 2.1 05/25/2019   No results found for: VD25OH  No results found for: PREALBUMIN CBC EXTENDED Latest Ref Rng & Units 11/07/2019 11/07/2019 09/25/2019  WBC 4.0 - 10.5 K/uL 3.3(L) - 3.3(L)  RBC 4.22 - 5.81 MIL/uL 3.58(L) - 2.92(L)  HGB 13.0 - 17.0 g/dL 11.1(L) - 9.2(L)  HCT 37.5 - 51.0 % 33.9(L) 36.0(L) 28.2(L)  PLT 150 - 400 K/uL 88(L) - 88(L)  NEUTROABS 1.7 - 7.7 K/uL 2.3 - 2.3  LYMPHSABS 0.7 - 4.0 K/uL 0.7 - 0.7     Body mass index is 29.44 kg/m.  Orders:  No orders of the defined types were placed in this encounter.  No orders of the defined types were placed in this encounter.    Procedures: No procedures performed  Clinical Data: No additional findings.  ROS:  All other systems negative, except  as noted in the HPI. Review of Systems  Objective: Vital Signs: Ht 5\' 7"  (1.702 m)   Wt 188 lb (85.3 kg)   BMI 29.44 kg/m   Specialty Comments:  No specialty comments available.  PMFS History: Patient Active Problem List   Diagnosis Date Noted  . Symptomatic bradycardia 09/24/2019  . Chronic diastolic heart failure ([place]) 06/03/2019  . Coronary artery disease involving native heart without angina pectoris 06/03/2019  . Debility 05/26/2019  . 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 Toe infection 05/17/2019  . DOE (dyspnea on exertion) 05/15/2019  . Severe tricuspid regurgitation 05/15/2019  . Acute on chronic right-sided heart failure ([place]) 05/15/2019  . Cellulitis of fourth toe of left foot   . Excessive daytime sleepiness 02/11/2019  . 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[place]) 02/11/2019  .  Iron deficiency anemia 02/04/2019  . Poor memory 12/16/2018  . Deficiency anemia 11/19/2018  . Other fatigue 11/19/2018  . History of elevated PSA 11/19/2018  . Prostate cancer screening 11/19/2018  . Prostate CA ([place]) 11/19/2018  . Pancytopenia, acquired ([place]) 08/13/2018  . Recurrent falls 08/13/2018  . 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 Peripheral neuropathy 07/31/2018  . 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 Anemia, chronic disease 01/28/2018  . History of skin cancer 07/27/2017  . Fatty liver disease, nonalcoholic [id number]  . Thrombocytopenia ([place]) 11/27/2016  . Lung nodule 04/23/2014  . Postinflammatory pulmonary fibrosis ([place]) 06/13/2013  . Long term (current) use of anticoagulants 11/22/2011  . Abdominal bruit 03/07/2011  . Pacemaker-St.Jude   . Hx of CABG   . Hyperlipidemia type II   . OSA (obstructive sleep apnea)   . PACEMAKER, PERMANENT 08/17/2010  . HYPERLIPIDEMIA 08/16/2010  . Obesity 08/16/2010  . Essential hypertension 08/16/2010  . Atrial fibrillation ([place]) 08/16/2010  . IRREGULAR HEART RATE 08/16/2010  . Sleep apnea 08/16/2010  . BENIGN PROSTATIC HYPERTROPHY, HX OF 08/16/2010   Past Medical History:  Diagnosis Date  . (HFpEF) heart failure with preserved ejection fraction ([place])    a. 05/2013 Echo: EF 55%, mild LVH, diast dysfxn, Ao sclerosis, mildly dil LA, RV dysfxn (poorly visualized), PASP 33mmHg;  b. 03/2017 Echo: EF 55-60%, no rwma, triv MR, mildly dil RV, mod TR, PASP 35mmHg.  . Atrial fibrillation [facility])    s/p Cox Maze 1/09;  Multaq Rx d/c'd in 2014 due to pulmo fibrosis;  coumadin d/c'd in 2014 due to spontaneous subdural hematoma  . BPH (benign prostatic hyperplasia)   . CAD (coronary artery disease), native coronary artery    a. s/p CABG 12/2007;  b. Myoview 12/2011: EF 66%, no scar or ischemia; normal.  . DM (diabetes mellitus) ([place])   . Hyperlipidemia type II   . Hypertension   . 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 OSA (obstructive sleep apnea)   . Pacemaker    PPM - St. Jude  . Peripheral neuropathy 07/31/2018  . Pulmonary fibrosis ([place])    Multaq d/c'd 7/14  . Subdural hematoma ([place]) 07/2012   spontaneous;  coumadin d/c'd => no longer a candidate for anticoagulation    Family History  Problem Relation Age of Onset  . Heart disease Father   . Heart attack Father   . Heart failure Father   . Heart disease Mother   . Alzheimer's disease Mother   . Dementia Mother     Past Surgical History:  Procedure Laterality Date  . AMPUTATION Left 05/17/2019  Procedure: AMPUTATION LEFT FOURTH TOE;  Surgeon: Meredith Pel, MD;  Location: [facility];  Service: Orthopedics;  Laterality: Left;  . APPENDECTOMY    . CHOLECYSTECTOMY    . CORONARY ARTERY BYPASS GRAFT     x3 (left internal mammary artery to distal left anterior descending coronary artery, saphenous vain graft to second circumflex marginal branch, saphenous vain graft to posterior descending coronary artery, endoscopic saphenous vain harvest from right thigh) and modified Cox - Maze IV procedure.  [name],MD. Electronically signed CHO/MEDQ D: 01/09/2008/ JOB: [id number] cc:  [name]  . [name]  07/30/2012   Procedure: CRANIOTOMY HEMATOMA EVACUATION SUBDURAL;  Surgeon: [name], MD;  Location: [facility];  Service: Neurosurgery;  Laterality: Right;  Right craniotomy for evacuation of subdural hematoma  . FOOT SURGERY    . HERNIA REPAIR    . ORCHIECTOMY     Left  /  testicular cancer  . PACEMAKER PLACEMENT     PPM - St. Jude  . PPM GENERATOR CHANGEOUT N/A 06/25/2019   Procedure: PPM GENERATOR CHANGEOUT;  Surgeon: Evans Lance, MD;  Location: [facility];  Service: Cardiovascular;  Laterality: N/A;   Social History   Occupational History  . Occupation: Retired- IT trainer  Tobacco Use  . Smoking status: Former Smoker    Packs/day: 2.00    Years: 20.00     Pack years: 40.00    Types: Cigarettes    Quit date: 02/21/1991    Years since quitting: 28.7  . Smokeless tobacco: Never Used  Substance and Sexual Activity  . Alcohol use: No    Alcohol/week: 0.0 standard drinks  . Drug use: No  . Sexual activity: Not Currently

## 2019-11-24 ENCOUNTER — Other Ambulatory Visit: Payer: Self-pay

## 2019-11-24 ENCOUNTER — Other Ambulatory Visit (INDEPENDENT_AMBULATORY_CARE_PROVIDER_SITE_OTHER): Payer: Self-pay

## 2019-11-24 DIAGNOSIS — G4719 Other hypersomnia: Secondary | ICD-10-CM

## 2019-11-24 DIAGNOSIS — Z0289 Encounter for other administrative examinations: Secondary | ICD-10-CM

## 2019-12-03 LAB — NARCOLEPSY EVALUATION
DQA1*01:02: NEGATIVE
DQB1*06:02: NEGATIVE

## 2019-12-04 ENCOUNTER — Other Ambulatory Visit: Payer: Self-pay | Admitting: Physical Medicine & Rehabilitation

## 2019-12-04 ENCOUNTER — Other Ambulatory Visit: Payer: Self-pay

## 2019-12-04 ENCOUNTER — Ambulatory Visit (INDEPENDENT_AMBULATORY_CARE_PROVIDER_SITE_OTHER): Payer: Medicare Other | Admitting: Orthopedic Surgery

## 2019-12-04 ENCOUNTER — Encounter: Payer: Self-pay | Admitting: Orthopedic Surgery

## 2019-12-04 VITALS — Ht 67.0 in | Wt 193.4 lb

## 2019-12-04 DIAGNOSIS — B351 Tinea unguium: Secondary | ICD-10-CM

## 2019-12-04 DIAGNOSIS — I872 Venous insufficiency (chronic) (peripheral): Secondary | ICD-10-CM | POA: Diagnosis not present

## 2019-12-04 DIAGNOSIS — I6330 Cerebral infarction due to thrombosis of unspecified cerebral artery: Secondary | ICD-10-CM

## 2019-12-04 NOTE — Telephone Encounter (Signed)
Needs to be refilled and managed by patients primary care provider. 

## 2019-12-05 ENCOUNTER — Encounter: Payer: Self-pay | Admitting: Orthopedic Surgery

## 2019-12-05 NOTE — Progress Notes (Signed)
Office Visit Note   Patient: [name].           Date of Birth: 1941-04-04           MRN: [id number] Visit Date: 12/04/2019              Requested by: [name], MD [address] [place],  [place] [address] PCP: [name], MD  Chief Complaint  Patient presents with  . Left Foot - Follow-up  . Right Foot - Follow-up      HPI: Patient is a 78 year old gentleman who presents in follow-up for venous insufficiency both legs as well as onychomycotic nails.  Patient denies any problems or new ulcers in the legs does complain of painful thickened onychomycotic nails x9.  Assessment & Plan: Visit Diagnoses:  1. Venous stasis dermatitis of both lower extremities   2. Onychomycosis     Plan: Nails were trimmed x9 without complication.  Recommended a stronger compression stocking.  Follow-Up Instructions: Return in about 3 months (around 03/03/2020).   Ortho Exam  Patient is alert, oriented, no adenopathy, well-dressed, normal affect, normal respiratory effort. Examination patient has thickened discolored onychomycotic nails x9 he is unable to safely trim the nails on his own and nails were trimmed x9 without complication.  Patient does have brawny skin color changes with pitting edema up to the tibial tubercle of both legs but no open ulcers.  Patient does have some clawing of his toes with a superficial ulcer over the second toe bilaterally but no exposed bone or tendon no drainage no sausage digit swelling.  Imaging: No results found. No images are attached to the encounter.  Labs: Lab Results  Component Value Date   HGBA1C 5.6 05/21/2019   HGBA1C 5.7 (H) 07/30/2012   HGBA1C  01/07/2008    5.3 (NOTE)   The ADA recommends the following therapeutic goals for glycemic   control related to Hgb A1C measurement:   Goal of Therapy:   < 7.0% Hgb A1C   Action Suggested:  > 8.0% Hgb A1C   Ref:  Diabetes Care, 22, Suppl. 1, 1999   ESRSEDRATE 40 (H)  05/01/2019   ESRSEDRATE 45 (H) 12/16/2018   ESRSEDRATE 18 06/18/2018   CRP <0.5 06/16/2013   LABURIC 3.9 12/16/2018   REPTSTATUS 05/20/2019 FINAL 05/15/2019   CULT  05/15/2019    NO GROWTH 5 DAYS Performed at [facility] [address], [place], [place] [address]      Lab Results  Component Value Date   ALBUMIN 3.8 09/25/2019   ALBUMIN 3.5 05/27/2019   ALBUMIN 3.2 (L) 05/26/2019   LABURIC 3.9 12/16/2018    Lab Results  Component Value Date   MG 2.3 09/09/2019   MG 2.0 05/26/2019   MG 2.1 05/25/2019   No results found for: VD25OH  No results found for: PREALBUMIN CBC EXTENDED Latest Ref Rng & Units 11/07/2019 11/07/2019 09/25/2019  WBC 4.0 - 10.5 K/uL 3.3(L) - 3.3(L)  RBC 4.22 - 5.81 MIL/uL 3.58(L) - 2.92(L)  HGB 13.0 - 17.0 g/dL 11.1(L) - 9.2(L)  HCT 37.5 - 51.0 % 33.9(L) 36.0(L) 28.2(L)  PLT 150 - 400 K/uL 88(L) - 88(L)  NEUTROABS 1.7 - 7.7 K/uL 2.3 - 2.3  LYMPHSABS 0.7 - 4.0 K/uL 0.7 - 0.7     Body mass index is 30.29 kg/m.  Orders:  No orders of the defined types were placed in this encounter.  No orders of the defined types were placed  in this encounter.    Procedures: No procedures performed  Clinical Data: No additional findings.  ROS:  All other systems negative, except as noted in the HPI. Review of Systems  Objective: Vital Signs: Ht 5\' 7"  (1.702 m)   Wt 193 lb 6.4 oz (87.7 kg)   BMI 30.29 kg/m   Specialty Comments:  No specialty comments available.  PMFS History: Patient Active Problem List   Diagnosis Date Noted  . Symptomatic bradycardia 09/24/2019  . Chronic diastolic heart failure ([place]) 06/03/2019  . Coronary artery disease involving native heart without angina pectoris 06/03/2019  . Debility 05/26/2019  . 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 Toe infection 05/17/2019  . DOE (dyspnea on exertion) 05/15/2019  . Severe tricuspid regurgitation 05/15/2019  . Acute on chronic right-sided heart  failure ([place]) 05/15/2019  . Cellulitis of fourth toe of left foot   . Excessive daytime sleepiness 02/11/2019  . 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[place]) 02/11/2019  . Iron deficiency anemia 02/04/2019  . Poor memory 12/16/2018  . Deficiency anemia 11/19/2018  . Other fatigue 11/19/2018  . History of elevated PSA 11/19/2018  . Prostate cancer screening 11/19/2018  . Prostate CA ([place]) 11/19/2018  . Pancytopenia, acquired ([place]) 08/13/2018  . Recurrent falls 08/13/2018  . 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 Peripheral neuropathy 07/31/2018  . 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 Anemia, chronic disease 01/28/2018  . History of skin cancer 07/27/2017  . Fatty liver disease, nonalcoholic [id number]  . Thrombocytopenia ([place]) 11/27/2016  . Lung nodule 04/23/2014  . Postinflammatory pulmonary fibrosis ([place]) 06/13/2013  . Long term (current) use of anticoagulants 11/22/2011  . Abdominal bruit 03/07/2011  . Pacemaker-St.Jude   . Hx of CABG   . Hyperlipidemia type II   . OSA (obstructive sleep apnea)   . PACEMAKER, PERMANENT 08/17/2010  . HYPERLIPIDEMIA 08/16/2010  . Obesity 08/16/2010  . Essential hypertension 08/16/2010  . Atrial fibrillation ([place]) 08/16/2010  . IRREGULAR HEART RATE 08/16/2010  . Sleep apnea 08/16/2010  . BENIGN PROSTATIC HYPERTROPHY, HX OF 08/16/2010   Past Medical History:  Diagnosis Date  . (HFpEF) heart failure with preserved ejection fraction ([place])    a. 05/2013 Echo: EF 55%, mild LVH, diast dysfxn, Ao sclerosis, mildly dil LA, RV dysfxn (poorly visualized), PASP 72mmHg;  b. 03/2017 Echo: EF 55-60%, no rwma, triv MR, mildly dil RV, mod TR, PASP 39mmHg.  . Atrial fibrillation [facility])    s/p Cox Maze 1/09;  Multaq Rx d/c'd in 2014 due to pulmo fibrosis;  coumadin d/c'd in 2014 due to spontaneous subdural hematoma  . BPH (benign prostatic hyperplasia)   . CAD (coronary artery disease), native coronary artery      a. s/p CABG 12/2007;  b. Myoview 12/2011: EF 66%, no scar or ischemia; normal.  . DM (diabetes mellitus) ([place])   . Hyperlipidemia type II   . Hypertension   . 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 OSA (obstructive sleep apnea)   . Pacemaker    PPM - St. Jude  . Peripheral neuropathy 07/31/2018  . Pulmonary fibrosis ([place])    Multaq d/c'd 7/14  . Subdural hematoma ([place]) 07/2012   spontaneous;  coumadin d/c'd => no longer a candidate for anticoagulation    Family History  Problem Relation Age of Onset  . Heart disease Father   . Heart attack Father   . Heart failure Father   . Heart disease Mother   .  Alzheimer's disease Mother   . Dementia Mother     Past Surgical History:  Procedure Laterality Date  . AMPUTATION Left 05/17/2019   Procedure: AMPUTATION LEFT FOURTH TOE;  Surgeon: Meredith Pel, MD;  Location: [facility];  Service: Orthopedics;  Laterality: Left;  . APPENDECTOMY    . CHOLECYSTECTOMY    . CORONARY ARTERY BYPASS GRAFT     x3 (left internal mammary artery to distal left anterior descending coronary artery, saphenous vain graft to second circumflex marginal branch, saphenous vain graft to posterior descending coronary artery, endoscopic saphenous vain harvest from right thigh) and modified Cox - Maze IV procedure.  [name],MD. Electronically signed CHO/MEDQ D: 01/09/2008/ JOB: [id number] cc:  [name]  . [name]  07/30/2012   Procedure: CRANIOTOMY HEMATOMA EVACUATION SUBDURAL;  Surgeon: [name], MD;  Location: [facility];  Service: Neurosurgery;  Laterality: Right;  Right craniotomy for evacuation of subdural hematoma  . FOOT SURGERY    . HERNIA REPAIR    . ORCHIECTOMY     Left  /  testicular cancer  . PACEMAKER PLACEMENT     PPM - [employer]. Jude  . PPM GENERATOR CHANGEOUT N/A 06/25/2019   Procedure: PPM GENERATOR CHANGEOUT;  Surgeon: Evans Lance, MD;  Location: [facility];  Service: Cardiovascular;   Laterality: N/A;   Social History   Occupational History  . Occupation: Retired- IT trainer  Tobacco Use  . Smoking status: Former Smoker    Packs/day: 2.00    Years: 20.00    Pack years: 40.00    Types: Cigarettes    Quit date: 02/21/1991    Years since quitting: 28.8  . Smokeless tobacco: Never Used  Substance and Sexual Activity  . Alcohol use: No    Alcohol/week: 0.0 standard drinks  . Drug use: No  . Sexual activity: Not Currently

## 2019-12-05 NOTE — Progress Notes (Signed)
HPI: FU CAD and atrial fibrillation. He underwent CABG (LIMA-LAD, SVG-OM 2, SVG-PDA) along with modified Cox Maze IV procedure in 12/2007. 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 Abdominal US 3/12: No aneurysm. 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 He underwent craniotomy and evacuation by Dr. Saintclair Halsted. He was taken off of Coumadin and no longer felt to be an anticoagulation candidate.Multaq DCed previously as felt causing pulmonary toxixity. Nuclear study 5/18 showed EF 59 with normal perfusion.EchocardiogramMay 2020 showed ejection fraction 50 to 55%, moderate right ventricular enlargement, mildly reduced RV function, severe tricuspid regurgitation.ABIs June 2020 normal. Carotid Dopplers June 2020 showed 1 to 39% bilateral stenosis. Transcranial Dopplers June 2020 negative.  Chest CT August 2020 showed mild to moderate pulmonary fibrosis. Pulmonary function test November 2020 normal.  Since last seen,patient has noticed increased lower extremity edema.  He has dyspnea on exertion.  No chest pain or syncope.  He continues to have problems with balance and falls.  Current Outpatient Medications  Medication Sig Dispense Refill  . acetaminophen (TYLENOL) 325 MG tablet Take 1-2 tablets (325-650 mg total) by mouth every 4 (four) hours as needed for mild pain. (Patient taking differently: Take 325-650 mg by mouth as needed for mild pain. )    . allopurinol (ZYLOPRIM) 300 MG tablet Take 300 mg by mouth daily.     [name] donepezil (ARICEPT) 10 MG tablet Take 1 tablet (10 mg total) by mouth at bedtime. 30 tablet 11  . gabapentin (NEURONTIN) 300 MG capsule TAKE 1 CAPSULE BY MOUTH THREE TIMES A DAY 90 capsule 1  . metoprolol tartrate (LOPRESSOR) 50 MG tablet Take 1 tablet (50 mg total) by mouth 2 (two) times daily. 60 tablet 1  . mirabegron ER (MYRBETRIQ) 50 MG TB24 tablet Take 1 tablet (50 mg total) by mouth daily. 30 tablet 3   . pantoprazole (PROTONIX) 40 MG tablet Take 40 mg by mouth daily.    . potassium chloride SA (K-DUR) 20 MEQ tablet Take 1 tablet (20 mEq total) by mouth daily. 30 tablet 1  . rosuvastatin (CRESTOR) 40 MG tablet Take 1 tablet (40 mg total) by mouth daily. (Patient taking differently: Take 40 mg by mouth every evening. ) 90 tablet 2  . sertraline (ZOLOFT) 100 MG tablet Take 200 mg by mouth daily.     . tamsulosin (FLOMAX) 0.4 MG CAPS capsule Take 0.4 mg by mouth at bedtime.     . torsemide (DEMADEX) 20 MG tablet Take 20 mg by mouth daily.     Current Facility-Administered Medications  Medication Dose Route Frequency Provider Last Rate Last Admin  . mupirocin ointment (BACTROBAN) 2 %   Topical BID Magnant, Gerrianne Scale, PA-C         Past Medical History:  Diagnosis Date  . (HFpEF) heart failure with preserved ejection fraction ([place])    a. 05/2013 Echo: EF 55%, mild LVH, diast dysfxn, Ao sclerosis, mildly dil LA, RV dysfxn (poorly visualized), PASP 34mmHg;  b. 03/2017 Echo: EF 55-60%, no rwma, triv MR, mildly dil RV, mod TR, PASP 9mmHg.  . Atrial fibrillation [facility])    s/p Cox Maze 1/09;  Multaq Rx d/c'd in 2014 due to pulmo fibrosis;  coumadin d/c'd in 2014 due to spontaneous subdural hematoma  . BPH (benign prostatic hyperplasia)   . CAD (coronary artery disease), native coronary artery    a. s/p CABG 12/2007;  b. Myoview 12/2011: EF 66%, no scar or ischemia; normal.  .  DM (diabetes mellitus) ([place])   . Hyperlipidemia type II   . Hypertension   . 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 OSA (obstructive sleep apnea)   . Pacemaker    PPM - St. Jude  . Peripheral neuropathy 07/31/2018  . Pulmonary fibrosis ([place])    Multaq d/c'd 7/14  . Subdural hematoma ([place]) 07/2012   spontaneous;  coumadin d/c'd => no longer a candidate for anticoagulation    Past Surgical History:  Procedure Laterality Date  . AMPUTATION Left 05/17/2019   Procedure: AMPUTATION LEFT FOURTH  TOE;  Surgeon: Meredith Pel, MD;  Location: [facility];  Service: Orthopedics;  Laterality: Left;  . APPENDECTOMY    . CHOLECYSTECTOMY    . CORONARY ARTERY BYPASS GRAFT     x3 (left internal mammary artery to distal left anterior descending coronary artery, saphenous vain graft to second circumflex marginal branch, saphenous vain graft to posterior descending coronary artery, endoscopic saphenous vain harvest from right thigh) and modified Cox - Maze IV procedure.  [name],MD. Electronically signed CHO/MEDQ D: 01/09/2008/ JOB: [id number] cc:  [name]  . [name]  07/30/2012   Procedure: CRANIOTOMY HEMATOMA EVACUATION SUBDURAL;  Surgeon: [name], MD;  Location: [facility];  Service: Neurosurgery;  Laterality: Right;  Right craniotomy for evacuation of subdural hematoma  . FOOT SURGERY    . HERNIA REPAIR    . ORCHIECTOMY     Left  /  testicular cancer  . PACEMAKER PLACEMENT     PPM - St. Jude  . PPM GENERATOR CHANGEOUT N/A 06/25/2019   Procedure: PPM GENERATOR CHANGEOUT;  Surgeon: Evans Lance, MD;  Location: [facility];  Service: Cardiovascular;  Laterality: N/A;    Social History   Socioeconomic History  . Marital status: Married    Spouse name: [name]  . Number of children: 2  . Years of education: Not on file  . Highest education level: Not on file  Occupational History  . Occupation: Retired- IT trainer  Tobacco Use  . Smoking status: Former Smoker    Packs/day: 2.00    Years: 20.00    Pack years: 40.00    Types: Cigarettes    Quit date: 02/21/1991    Years since quitting: 28.8  . Smokeless tobacco: Never Used  Substance and Sexual Activity  . Alcohol use: No    Alcohol/week: 0.0 standard drinks  . Drug use: No  . Sexual activity: Not Currently  Other Topics Concern  . Not on file  Social History Narrative   Lives with wife   Right handed    Married with two children.     He is a Engineer, structural.     Social Determinants of Health    Financial Resource Strain:   . Difficulty of Paying Living Expenses: Not on file  Food Insecurity:   . Worried About Charity fundraiser in the Last Year: Not on file  . Ran Out of Food in the Last Year: Not on file  Transportation Needs:   . Lack of Transportation (Medical): Not on file  . Lack of Transportation (Non-Medical): Not on file  Physical Activity:   . Days of Exercise per Week: Not on file  . Minutes of Exercise per Session: Not on file  Stress:   . Feeling of Stress : Not on file  Social Connections:   . Frequency of Communication with Friends and Family: Not on file  .  Frequency of Social Gatherings with Friends and Family: Not on file  . Attends Religious Services: Not on file  . Active Member of Clubs or Organizations: Not on file  . Attends Archivist Meetings: Not on file  . Marital Status: Not on file  Intimate Partner Violence:   . Fear of Current or Ex-Partner: Not on file  . Emotionally Abused: Not on file  . Physically Abused: Not on file  . Sexually Abused: Not on file    Family History  Problem Relation Age of Onset  . Heart disease Father   . Heart attack Father   . Heart failure Father   . Heart disease Mother   . Alzheimer's disease Mother   . Dementia Mother     ROS: no fevers or chills, productive cough, hemoptysis, dysphasia, odynophagia, melena, hematochezia, dysuria, hematuria, rash, seizure activity, claudication. Remaining systems are negative.  Physical Exam: Well-developed chronically ill appearing in no acute distress.  Skin is warm and dry.  HEENT is normal.  Neck is supple.  Chest is clear to auscultation with normal expansion.  Cardiovascular exam is regular rate and rhythm.  3/6 systolic murmur right sternal border. Abdominal exam mildly distended.  No masses palpated. Extremities show 2+ edema. neuro grossly intact  A/P  1 chronic right-sided heart failure-patient is volume overloaded today.  This is felt to be  multifactorial including pulmonary venous hypertension, history of pulmonary fibrosis and sleep apnea.  Increase Demadex to 20 mg twice daily.  Check potassium and renal function in 1 week.  Note his Demadex was decreased previously because of low blood pressure.  If he has similar problems in the future we will decrease metoprolol which is being used for ectopic atrial tachycardia.  Continue fluid restriction and low-sodium diet.  2 paroxysmal atrial fibrillation- Continue beta-blocker at present dose.  Patient has a history of spontaneous subdural hematoma and is therefore not on anticoagulation.  Patient was noted to have episodes of atrial tachycardia at a rate of 110 previously.  Given that he was asymptomatic he has been treated medically.  If blood pressure continues to be an issue will need to consider decreasing metoprolol and potentially adding renal dose digoxin.  Would not use amiodarone given possible pulmonary toxicity with Multaq in the past.  3 history of pacemaker-managed by electrophysiology.  4 severe tricuspid regurgitation-possibly secondary to right ventricular enlargement related to pulmonary hypertension versus pacemaker.  Patient not a good surgical candidate.  5 coronary artery disease-continue statin.  Given history of spontaneous intracranial hemorrhage she is not on aspirin.  6 hyperlipidemia-continue statin.  7 recent falls-patient states he has problems with his balance.  Encouraged use of walker.  8 cirrhosis-followed by primary care.  9 pulmonary fibrosis-Per pulmonary.  10 chronic anemia/thrombocytopenia-felt secondary to cirrhosis and splenomegaly, anemia of chronic disease and renal insufficiency.  Patient is followed by hematology.  Kirk Ruths, MD

## 2019-12-09 ENCOUNTER — Telehealth: Payer: Self-pay | Admitting: *Deleted

## 2019-12-09 NOTE — Telephone Encounter (Signed)
I called pt and relayed that the narcolepsy test was negative.  He verbalized understanding.

## 2019-12-09 NOTE — Telephone Encounter (Signed)
-----   Message from Ward Givens, NP sent at 12/08/2019  9:39 AM EST ----- Narcolepsy test was negative

## 2019-12-10 ENCOUNTER — Encounter: Payer: Self-pay | Admitting: Cardiology

## 2019-12-10 ENCOUNTER — Other Ambulatory Visit: Payer: Self-pay

## 2019-12-10 ENCOUNTER — Ambulatory Visit (INDEPENDENT_AMBULATORY_CARE_PROVIDER_SITE_OTHER): Payer: Medicare Other | Admitting: Cardiology

## 2019-12-10 VITALS — BP 116/68 | HR 97 | Temp 93.2°F | Ht 67.0 in | Wt 193.8 lb

## 2019-12-10 DIAGNOSIS — Z950 Presence of cardiac pacemaker: Secondary | ICD-10-CM | POA: Diagnosis not present

## 2019-12-10 DIAGNOSIS — I6330 Cerebral infarction due to thrombosis of unspecified cerebral artery: Secondary | ICD-10-CM

## 2019-12-10 DIAGNOSIS — I480 Paroxysmal atrial fibrillation: Secondary | ICD-10-CM

## 2019-12-10 DIAGNOSIS — I2510 Atherosclerotic heart disease of native coronary artery without angina pectoris: Secondary | ICD-10-CM | POA: Diagnosis not present

## 2019-12-10 DIAGNOSIS — I50812 Chronic right heart failure: Secondary | ICD-10-CM | POA: Diagnosis not present

## 2019-12-10 MED ORDER — TORSEMIDE 20 MG PO TABS: 20.0000 mg | ORAL_TABLET | Freq: Two times a day (BID) | ORAL | 3 refills | Status: DC

## 2019-12-10 NOTE — Patient Instructions (Signed)
Medication Instructions:  INCREASE DEMADEX TO 20 MG TWICE DAILY  *If you need a refill on your cardiac medications before your next appointment, please call your pharmacy*  Lab Work: Your physician recommends that you return for lab work in: [place]  If you have labs (blood work) drawn today and your tests are completely normal, you will receive your results only by: [name] MyChart Message (if you have MyChart) OR . A paper copy in the mail If you have any lab test that is abnormal or we need to change your treatment, we will call you to review the results.  Follow-Up: At [facility], you and your health needs are our priority.  As part of our continuing mission to provide you with exceptional heart care, we have created designated Provider Care Teams.  These Care Teams include your primary Cardiologist (physician) and Advanced Practice Providers (APPs -  Physician Assistants and Nurse Practitioners) who all work together to provide you with the care you need, when you need it.  Your physician recommends that you schedule a follow-up appointment in: 4-6 [place] physician recommends that you schedule a follow-up appointment in: [place]

## 2019-12-24 ENCOUNTER — Ambulatory Visit (INDEPENDENT_AMBULATORY_CARE_PROVIDER_SITE_OTHER): Payer: Medicare Other | Admitting: *Deleted

## 2019-12-24 DIAGNOSIS — I50813 Acute on chronic right heart failure: Secondary | ICD-10-CM | POA: Diagnosis not present

## 2019-12-24 LAB — CUP PACEART REMOTE DEVICE CHECK
Battery Remaining Longevity: 121 mo
Battery Remaining Percentage: 95.5 %
Battery Voltage: 3.02 V
Brady Statistic AP VP Percent: 11 %
Brady Statistic AP VS Percent: 48 %
Brady Statistic AS VP Percent: 1 %
Brady Statistic AS VS Percent: 40 %
Brady Statistic RA Percent Paced: 58 %
Brady Statistic RV Percent Paced: 11 %
Date Time Interrogation Session: 20210106040013
Implantable Lead Implant Date: 20090420
Implantable Lead Implant Date: 20090420
Implantable Lead Location: 753859
Implantable Lead Location: 753860
Implantable Pulse Generator Implant Date: 20200708
Lead Channel Impedance Value: 410 Ohm
Lead Channel Impedance Value: 540 Ohm
Lead Channel Pacing Threshold Amplitude: 0.75 V
Lead Channel Pacing Threshold Amplitude: 1 V
Lead Channel Pacing Threshold Pulse Width: 0.4 ms
Lead Channel Pacing Threshold Pulse Width: 0.4 ms
Lead Channel Sensing Intrinsic Amplitude: 1.3 mV
Lead Channel Sensing Intrinsic Amplitude: 6.4 mV
Lead Channel Setting Pacing Amplitude: 1 V
Lead Channel Setting Pacing Amplitude: 2 V
Lead Channel Setting Pacing Pulse Width: 0.4 ms
Lead Channel Setting Sensing Sensitivity: 2 mV
Pulse Gen Model: 2272
Pulse Gen Serial Number: 9149180

## 2019-12-28 NOTE — Progress Notes (Deleted)
PATIENT: [name]. DOB: Feb 19, 1941  REASON FOR VISIT: follow up HISTORY FROM: patient  HISTORY OF PRESENT ILLNESS: Today 12/28/19  HISTORY   REVIEW OF SYSTEMS: Out of a complete 14 system review of symptoms, the patient complains only of the following symptoms, and all other reviewed systems are negative.  ALLERGIES: Allergies  Allergen Reactions  . Lipitor [Atorvastatin] Other (See Comments)    Stiff joints  . 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 Warfarin And Related Other (See Comments)    Stiff joints  . Enbrel [Etanercept] Itching    HOME MEDICATIONS: Outpatient Medications Prior to Visit  Medication Sig Dispense Refill  . acetaminophen (TYLENOL) 325 MG tablet Take 1-2 tablets (325-650 mg total) by mouth every 4 (four) hours as needed for mild pain. (Patient taking differently: Take 325-650 mg by mouth as needed for mild pain. )    . allopurinol (ZYLOPRIM) 300 MG tablet Take 300 mg by mouth daily.     [name] donepezil (ARICEPT) 10 MG tablet Take 1 tablet (10 mg total) by mouth at bedtime. 30 tablet 11  . gabapentin (NEURONTIN) 300 MG capsule TAKE 1 CAPSULE BY MOUTH THREE TIMES A DAY 90 capsule 1  . metoprolol tartrate (LOPRESSOR) 50 MG tablet Take 1 tablet (50 mg total) by mouth 2 (two) times daily. 60 tablet 1  . mirabegron ER (MYRBETRIQ) 50 MG TB24 tablet Take 1 tablet (50 mg total) by mouth daily. 30 tablet 3  . pantoprazole (PROTONIX) 40 MG tablet Take 40 mg by mouth daily.    . potassium chloride SA (K-DUR) 20 MEQ tablet Take 1 tablet (20 mEq total) by mouth daily. 30 tablet 1  . rosuvastatin (CRESTOR) 40 MG tablet Take 1 tablet (40 mg total) by mouth daily. (Patient taking differently: Take 40 mg by mouth every evening. ) 90 tablet 2  . sertraline (ZOLOFT) 100 MG tablet Take 200 mg by mouth daily.     . tamsulosin (FLOMAX) 0.4 MG CAPS capsule Take 0.4 mg by mouth at bedtime.     . torsemide (DEMADEX) 20 MG tablet Take 1 tablet (20 mg  total) by mouth 2 (two) times daily. 180 tablet 3   Facility-Administered Medications Prior to Visit  Medication Dose Route Frequency Provider Last Rate Last Admin  . mupirocin ointment (BACTROBAN) 2 %   Topical BID Magnant, Gerrianne Scale, PA-C        PAST MEDICAL HISTORY: Past Medical History:  Diagnosis Date  . (HFpEF) heart failure with preserved ejection fraction ([place])    a. 05/2013 Echo: EF 55%, mild LVH, diast dysfxn, Ao sclerosis, mildly dil LA, RV dysfxn (poorly visualized), PASP 69mmHg;  b. 03/2017 Echo: EF 55-60%, no rwma, triv MR, mildly dil RV, mod TR, PASP 51mmHg.  . Atrial fibrillation [facility])    s/p [facility] Maze 1/09;  Multaq Rx d/c'd in 2014 due to pulmo fibrosis;  coumadin d/c'd in 2014 due to spontaneous subdural hematoma  . BPH (benign prostatic hyperplasia)   . CAD (coronary artery disease), native coronary artery    a. s/p CABG 12/2007;  b. Myoview 12/2011: EF 66%, no scar or ischemia; normal.  . DM (diabetes mellitus) ([place])   . Hyperlipidemia type II   . Hypertension   . 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 OSA (obstructive sleep apnea)   . Pacemaker    PPM - St. Jude  . Peripheral neuropathy 07/31/2018  .  Pulmonary fibrosis ([place])    Multaq d/c'd 7/14  . Subdural hematoma ([place]) 07/2012   spontaneous;  coumadin d/c'd => no longer a candidate for anticoagulation    PAST SURGICAL HISTORY: Past Surgical History:  Procedure Laterality Date  . AMPUTATION Left 05/17/2019   Procedure: AMPUTATION LEFT FOURTH TOE;  Surgeon: Meredith Pel, MD;  Location: [facility];  Service: Orthopedics;  Laterality: Left;  . APPENDECTOMY    . CHOLECYSTECTOMY    . CORONARY ARTERY BYPASS GRAFT     x3 (left internal mammary artery to distal left anterior descending coronary artery, saphenous vain graft to second circumflex marginal branch, saphenous vain graft to posterior descending coronary artery, endoscopic saphenous vain harvest from right thigh) and modified  Cox - Maze IV procedure.  [name],MD. Electronically signed CHO/MEDQ D: 01/09/2008/ JOB: [id number] cc:  [name]  . [name]  07/30/2012   Procedure: CRANIOTOMY HEMATOMA EVACUATION SUBDURAL;  Surgeon: [name], MD;  Location: [place];  Service: Neurosurgery;  Laterality: Right;  Right craniotomy for evacuation of subdural hematoma  . FOOT SURGERY    . HERNIA REPAIR    . ORCHIECTOMY     Left  /  testicular cancer  . PACEMAKER PLACEMENT     PPM - St. Jude  . PPM GENERATOR CHANGEOUT N/A 06/25/2019   Procedure: PPM GENERATOR CHANGEOUT;  Surgeon: Evans Lance, MD;  Location: [place] [facility];  Service: Cardiovascular;  Laterality: N/A;    FAMILY HISTORY: Family History  Problem Relation Age of Onset  . Heart disease Father   . Heart attack Father   . Heart failure Father   . Heart disease Mother   . Alzheimer's disease Mother   . Dementia Mother     SOCIAL HISTORY: Social History   Socioeconomic History  . Marital status: Married    Spouse name: [name]  . Number of children: 2  . Years of education: Not on file  . Highest education level: Not on file  Occupational History  . Occupation: Retired- IT trainer  Tobacco Use  . Smoking status: Former Smoker    Packs/day: 2.00    Years: 20.00    Pack years: 40.00    Types: Cigarettes    Quit date: 02/21/1991    Years since quitting: 28.8  . Smokeless tobacco: Never Used  Substance and Sexual Activity  . Alcohol use: No    Alcohol/week: 0.0 standard drinks  . Drug use: No  . Sexual activity: Not Currently  Other Topics Concern  . Not on file  Social History Narrative   Lives with wife   Right handed    Married with two children.     He is a Engineer, structural.     Social Determinants of Health   Financial Resource Strain:   . Difficulty of Paying Living Expenses: Not on file  Food Insecurity:   . Worried About Charity fundraiser in the Last Year: Not on file  . Ran Out of Food in the Last  Year: Not on file  Transportation Needs:   . Lack of Transportation (Medical): Not on file  . Lack of Transportation (Non-Medical): Not on file  Physical Activity:   . Days of Exercise per Week: Not on file  . Minutes of Exercise per Session: Not on file  Stress:   . Feeling of Stress : Not on file  Social Connections:   . Frequency of Communication with Friends and Family: Not on  file  . Frequency of Social Gatherings with Friends and Family: Not on file  . Attends Religious Services: Not on file  . Active Member of Clubs or Organizations: Not on file  . Attends Archivist Meetings: Not on file  . Marital Status: Not on file  Intimate Partner Violence:   . Fear of Current or Ex-Partner: Not on file  . Emotionally Abused: Not on file  . Physically Abused: Not on file  . Sexually Abused: Not on file      PHYSICAL EXAM  There were no vitals filed for this visit. There is no height or weight on file to calculate BMI.  Generalized: Well developed, in no acute distress   Neurological examination  Mentation: Alert oriented to time, place, history taking. Follows all commands speech and language fluent Cranial nerve II-XII: Pupils were equal round reactive to light. Extraocular movements were full, visual field were full on confrontational test. Facial sensation and strength were normal. Uvula tongue midline. Head turning and shoulder shrug  were normal and symmetric. Motor: The motor testing reveals 5 over 5 strength of all 4 extremities. Good symmetric motor tone is noted throughout.  Sensory: Sensory testing is intact to soft touch on all 4 extremities. No evidence of extinction is noted.  Coordination: Cerebellar testing reveals good finger-nose-finger and heel-to-shin bilaterally.  Gait and station: Gait is normal. Tandem gait is normal. Romberg is negative. No drift is seen.  Reflexes: Deep tendon reflexes are symmetric and normal bilaterally.   DIAGNOSTIC DATA  (LABS, IMAGING, TESTING) - I reviewed patient records, labs, notes, testing and imaging myself where available.  Lab Results  Component Value Date   WBC 3.3 (L) 11/07/2019   HGB 11.1 (L) 11/07/2019   HCT 33.9 (L) 11/07/2019   MCV 100.6 (H) 11/07/2019   PLT 88 (L) 11/07/2019      Component Value Date/Time   NA 141 09/25/2019 0915   NA 146 (H) 09/08/2019 1602   NA 141 02/23/2017 1237   K 3.6 09/25/2019 0915   K 3.8 02/23/2017 1237   CL 108 09/25/2019 0915   CO2 25 09/25/2019 0915   CO2 27 02/23/2017 1237   GLUCOSE 81 09/25/2019 0915   GLUCOSE 113 02/23/2017 1237   BUN 20 09/25/2019 0915   BUN 30 (H) 09/08/2019 1602   BUN 18.3 02/23/2017 1237   CREATININE 1.37 (H) 09/25/2019 0915   CREATININE 1.2 02/23/2017 1237   CALCIUM 8.7 (L) 09/25/2019 0915   CALCIUM 9.6 02/23/2017 1237   PROT 7.6 09/25/2019 0915   PROT 7.3 06/18/2018 1205   PROT 8.0 02/23/2017 1237   ALBUMIN 3.8 09/25/2019 0915   ALBUMIN 4.1 09/24/2017 0824   ALBUMIN 4.1 02/23/2017 1237   AST 31 09/25/2019 0915   AST 24 02/23/2017 1237   ALT 20 09/25/2019 0915   ALT 15 02/23/2017 1237   ALKPHOS 113 09/25/2019 0915   ALKPHOS 105 02/23/2017 1237   BILITOT 0.5 09/25/2019 0915   BILITOT 0.6 09/24/2017 0824   BILITOT 0.98 02/23/2017 1237   GFRNONAA 50 (L) 09/25/2019 0915   GFRAA 58 (L) 09/25/2019 0915   Lab Results  Component Value Date   CHOL 103 05/22/2019   HDL 40 (L) 05/22/2019   LDLCALC 51 05/22/2019   LDLDIRECT 154.2 10/20/2013   TRIG 58 05/22/2019   CHOLHDL 2.6 05/22/2019   Lab Results  Component Value Date   HGBA1C 5.6 05/21/2019   Lab Results  Component Value Date   VITAMINB12 481 11/07/2019  Lab Results  Component Value Date   TSH 2.022 05/16/2019      ASSESSMENT AND PLAN 79 y.o. year old male  has a past medical history of (HFpEF) heart failure with preserved ejection fraction (HCC), Atrial fibrillation ([place]), BPH (benign prostatic hyperplasia), CAD (coronary artery disease), native  coronary artery, DM (diabetes mellitus) ([place]), Hyperlipidemia type II, Hypertension, [name] (monoclonal gammopathy of unknown significance) (07/31/2018), OSA (obstructive sleep apnea), Pacemaker, Peripheral neuropathy (07/31/2018), Pulmonary fibrosis ([place]), and Subdural hematoma ([place]) (07/2012). here with ***   I spent 15 minutes with the patient. 50% of this time was spent   Butler Denmark, [place], DNP 12/28/2019, 8:43 PM [facility] [address] [place], [place] [address] [phone]

## 2019-12-29 ENCOUNTER — Telehealth: Payer: Self-pay | Admitting: *Deleted

## 2019-12-29 ENCOUNTER — Ambulatory Visit: Payer: Medicare Other | Admitting: Neurology

## 2019-12-29 NOTE — Progress Notes (Signed)
PATIENT: [name]. DOB: [birth year]  REASON FOR VISIT: follow up HISTORY FROM: patient  HISTORY OF PRESENT ILLNESS: Today 12/30/19  Mr. Banaszewski is a 79 year old male with history of sleep apnea on CPAP, stroke, and memory disturbance.  He has complained of daytime fatigue, easily falls asleep during the day.  He also has history of hallucinations.  He tried to move his Aricept dosing in the morning, but that did not impact his hallucinations.  After his last visit, he was taken off Aricept.  He was tested for narcolepsy, this was negative.  EMG study in August 2019 shows evidence of a significant peripheral neuropathy.  He has had significant issues with falling, and has been to physical therapy previously for gait training.  He has been found to have a positive IgA monoclonal antibody. His memory score in December 2020 was 27/30.  He is here today with his wife.  He continues to report frequent falls.  Last year he had a fall, where he suffered a C6 fracture, is wearing a cervical collar, is under the care of Dr. Alma Friendly.  Since stopping Aricept, his hallucinations has resolved.  He still he still does not sleep much at night, despite using CPAP.  He says he will get asleep easily but only sleeps for about 3 or 4 hours.  The rest of the night he is up, will often organize or straighten up.  During the day he easily falls asleep, takes frequent naps during the day.  He has been on several cycles of physical therapy for gait training, but has been out for a couple months.  He does use a walker, but admits he often does not have it with him when he falls.  In regards to his memory, he has difficulty with short-term memory.  HISTORY 11/18/2019 MM: Mr. Leeder is a 79 year old male with a history of obstructive sleep apnea on CPAP.  His download indicates that he uses machine 18 out of 30 days for compliance of 60%.  He uses machine greater than 4 hours 11 days for compliance of 37%.  On average he  uses his machine 4 hours and 55 minutes.  His residual AHI is 1.6 on 6 to 16 cm of water with EPR 3.  His leak in the 95th percentile is 35.7 L/min.  Wife reports that he tends to fall asleep easily during the day.  She states that they can be out at dinner and he will fall asleep while eating.  Sometimes his hand will fall in his food.  She states that typically when he wakes up he begins having hallucinations.  She states he also has this during the night.  She is concerned that he has narcolepsy considering how easy it is for him to go to sleep.  He does not operate a motor vehicle.  Patient states that he will fall asleep in the car easily.  In the past they called in about the hallucinations and was advised by Dr. Jannifer Franklin to move Aricept to the a.m.  They have done that however now he has these hallucinations during the day and at night.  He returns today for an evaluation.   REVIEW OF SYSTEMS: Out of a complete 14 system review of symptoms, the patient complains only of the following symptoms, and all other reviewed systems are negative.  Daytime sleepiness, memory loss  ALLERGIES: Allergies  Allergen Reactions  . Lipitor [Atorvastatin] Other (See Comments)    Stiff joints  .  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 Warfarin And Related Other (See Comments)    Stiff joints  . Enbrel [Etanercept] Itching    HOME MEDICATIONS: Outpatient Medications Prior to Visit  Medication Sig Dispense Refill  . acetaminophen (TYLENOL) 325 MG tablet Take 1-2 tablets (325-650 mg total) by mouth every 4 (four) hours as needed for mild pain. (Patient taking differently: Take 325-650 mg by mouth as needed for mild pain. )    . allopurinol (ZYLOPRIM) 300 MG tablet Take 300 mg by mouth daily.     [name] donepezil (ARICEPT) 10 MG tablet Take 1 tablet (10 mg total) by mouth at bedtime. 30 tablet 11  . gabapentin (NEURONTIN) 300 MG capsule TAKE 1 CAPSULE BY MOUTH THREE TIMES A DAY 90 capsule 1  .  metoprolol tartrate (LOPRESSOR) 50 MG tablet Take 1 tablet (50 mg total) by mouth 2 (two) times daily. 60 tablet 1  . mirabegron ER (MYRBETRIQ) 50 MG TB24 tablet Take 1 tablet (50 mg total) by mouth daily. 30 tablet 3  . pantoprazole (PROTONIX) 40 MG tablet Take 40 mg by mouth daily.    . potassium chloride SA (K-DUR) 20 MEQ tablet Take 1 tablet (20 mEq total) by mouth daily. 30 tablet 1  . rosuvastatin (CRESTOR) 40 MG tablet Take 1 tablet (40 mg total) by mouth daily. (Patient taking differently: Take 40 mg by mouth every evening. ) 90 tablet 2  . sertraline (ZOLOFT) 100 MG tablet Take 200 mg by mouth daily.     . tamsulosin (FLOMAX) 0.4 MG CAPS capsule Take 0.4 mg by mouth at bedtime.     . torsemide (DEMADEX) 20 MG tablet Take 1 tablet (20 mg total) by mouth 2 (two) times daily. 180 tablet 3   Facility-Administered Medications Prior to Visit  Medication Dose Route Frequency Provider Last Rate Last Admin  . mupirocin ointment (BACTROBAN) 2 %   Topical BID Magnant, Gerrianne Scale, PA-C        PAST MEDICAL HISTORY: Past Medical History:  Diagnosis Date  . (HFpEF) heart failure with preserved ejection fraction ([place])    a. 05/2013 Echo: EF 55%, mild LVH, diast dysfxn, Ao sclerosis, mildly dil LA, RV dysfxn (poorly visualized), PASP 5mmHg;  b. 03/2017 Echo: EF 55-60%, no rwma, triv MR, mildly dil RV, mod TR, PASP 42mmHg.  . Atrial fibrillation [facility])    s/p Cox Maze 1/09;  Multaq Rx d/c'd in 2014 due to pulmo fibrosis;  coumadin d/c'd in 2014 due to spontaneous subdural hematoma  . BPH (benign prostatic hyperplasia)   . CAD (coronary artery disease), native coronary artery    a. s/p CABG 12/2007;  b. Myoview 12/2011: EF 66%, no scar or ischemia; normal.  . DM (diabetes mellitus) ([place])   . Hyperlipidemia type II   . Hypertension   . 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 OSA (obstructive sleep apnea)   . Pacemaker    PPM - St. Jude  . Peripheral neuropathy  07/31/2018  . Pulmonary fibrosis ([place])    Multaq d/c'd 7/14  . Subdural hematoma ([place]) 07/2012   spontaneous;  coumadin d/c'd => no longer a candidate for anticoagulation    PAST SURGICAL HISTORY: Past Surgical History:  Procedure Laterality Date  . AMPUTATION Left 05/17/2019   Procedure: AMPUTATION LEFT FOURTH TOE;  Surgeon: Meredith Pel, MD;  Location: [facility];  Service: Orthopedics;  Laterality: Left;  . APPENDECTOMY    .  CHOLECYSTECTOMY    . CORONARY ARTERY BYPASS GRAFT     x3 (left internal mammary artery to distal left anterior descending coronary artery, saphenous vain graft to second circumflex marginal branch, saphenous vain graft to posterior descending coronary artery, endoscopic saphenous vain harvest from right thigh) and modified Cox - Maze IV procedure.  [name],MD. Electronically signed CHO/MEDQ D: 01/09/2008/ JOB: [id number] cc:  [name]  . [name]  07/30/2012   Procedure: CRANIOTOMY HEMATOMA EVACUATION SUBDURAL;  Surgeon: [name], MD;  Location: [facility];  Service: Neurosurgery;  Laterality: Right;  Right craniotomy for evacuation of subdural hematoma  . FOOT SURGERY    . HERNIA REPAIR    . ORCHIECTOMY     Left  /  testicular cancer  . PACEMAKER PLACEMENT     PPM - St. Jude  . PPM GENERATOR CHANGEOUT N/A 06/25/2019   Procedure: PPM GENERATOR CHANGEOUT;  Surgeon: Evans Lance, MD;  Location: [facility];  Service: Cardiovascular;  Laterality: N/A;    FAMILY HISTORY: Family History  Problem Relation Age of Onset  . Heart disease Father   . Heart attack Father   . Heart failure Father   . Heart disease Mother   . Alzheimer's disease Mother   . Dementia Mother     SOCIAL HISTORY: Social History   Socioeconomic History  . Marital status: Married    Spouse name: [name]  . Number of children: 2  . Years of education: Not on file  . Highest education level: Not on file  Occupational History  . Occupation: Retired- Engineer, agricultural  Tobacco Use  . Smoking status: Former Smoker    Packs/day: 2.00    Years: 20.00    Pack years: 40.00    Types: Cigarettes    Quit date: 02/21/1991    Years since quitting: 28.8  . Smokeless tobacco: Never Used  Substance and Sexual Activity  . Alcohol use: No    Alcohol/week: 0.0 standard drinks  . Drug use: No  . Sexual activity: Not Currently  Other Topics Concern  . Not on file  Social History Narrative   Lives with wife   Right handed    Married with two children.     He is a Engineer, structural.     Social Determinants of Health   Financial Resource Strain:   . Difficulty of Paying Living Expenses: Not on file  Food Insecurity:   . Worried About Charity fundraiser in the Last Year: Not on file  . Ran Out of Food in the Last Year: Not on file  Transportation Needs:   . Lack of Transportation (Medical): Not on file  . Lack of Transportation (Non-Medical): Not on file  Physical Activity:   . Days of Exercise per Week: Not on file  . Minutes of Exercise per Session: Not on file  Stress:   . Feeling of Stress : Not on file  Social Connections:   . Frequency of Communication with Friends and Family: Not on file  . Frequency of Social Gatherings with Friends and Family: Not on file  . Attends Religious Services: Not on file  . Active Member of Clubs or Organizations: Not on file  . Attends Archivist Meetings: Not on file  . Marital Status: Not on file  Intimate Partner Violence:   . Fear of Current or Ex-Partner: Not on file  . Emotionally Abused: Not on file  . Physically Abused: Not  on file  . Sexually Abused: Not on file   PHYSICAL EXAM  Vitals:   12/30/19 0820  BP: (!) 101/53  Pulse: 60  Temp: (!) 97.4 F (36.3 C)  Weight: 187 lb 9.6 oz (85.1 kg)  Height: 5\' 8"  (1.727 m)   Body mass index is 28.52 kg/m.  Generalized: Well developed, in no acute distress   Neurological examination  Mentation: Alert oriented to time, place, history  taking. Follows all commands speech and language fluent Cranial nerve II-XII: Pupils were equal round reactive to light. Extraocular movements were full, visual field were full on confrontational test. Facial sensation and strength were normal. Head turning and shoulder shrug  were normal and symmetric. Motor: The motor testing reveals 5 over 5 strength of all 4 extremities. Good symmetric motor tone is noted throughout.  Sensory: Sensory testing is intact to soft touch on all 4 extremities. No evidence of extinction is noted.  Coordination: Cerebellar testing reveals good finger-nose-finger and heel-to-shin bilaterally.  Gait and station: Slow to rise from seated position, has to push off, able to use the stool to get on the exam table, but slowly.  Gait is wide-based, cautious, has rolling walker Reflexes: Deep tendon reflexes are symmetric  DIAGNOSTIC DATA (LABS, IMAGING, TESTING) - I reviewed patient records, labs, notes, testing and imaging myself where available.  Lab Results  Component Value Date   WBC 3.3 (L) 11/07/2019   HGB 11.1 (L) 11/07/2019   HCT 33.9 (L) 11/07/2019   MCV 100.6 (H) 11/07/2019   PLT 88 (L) 11/07/2019      Component Value Date/Time   NA 141 09/25/2019 0915   NA 146 (H) 09/08/2019 1602   NA 141 02/23/2017 1237   K 3.6 09/25/2019 0915   K 3.8 02/23/2017 1237   CL 108 09/25/2019 0915   CO2 25 09/25/2019 0915   CO2 27 02/23/2017 1237   GLUCOSE 81 09/25/2019 0915   GLUCOSE 113 02/23/2017 1237   BUN 20 09/25/2019 0915   BUN 30 (H) 09/08/2019 1602   BUN 18.3 02/23/2017 1237   CREATININE 1.37 (H) 09/25/2019 0915   CREATININE 1.2 02/23/2017 1237   CALCIUM 8.7 (L) 09/25/2019 0915   CALCIUM 9.6 02/23/2017 1237   PROT 7.6 09/25/2019 0915   PROT 7.3 06/18/2018 1205   PROT 8.0 02/23/2017 1237   ALBUMIN 3.8 09/25/2019 0915   ALBUMIN 4.1 09/24/2017 0824   ALBUMIN 4.1 02/23/2017 1237   AST 31 09/25/2019 0915   AST 24 02/23/2017 1237   ALT 20 09/25/2019 0915    ALT 15 02/23/2017 1237   ALKPHOS 113 09/25/2019 0915   ALKPHOS 105 02/23/2017 1237   BILITOT 0.5 09/25/2019 0915   BILITOT 0.6 09/24/2017 0824   BILITOT 0.98 02/23/2017 1237   GFRNONAA 50 (L) 09/25/2019 0915   GFRAA 58 (L) 09/25/2019 0915   Lab Results  Component Value Date   CHOL 103 05/22/2019   HDL 40 (L) 05/22/2019   LDLCALC 51 05/22/2019   LDLDIRECT 154.2 10/20/2013   TRIG 58 05/22/2019   CHOLHDL 2.6 05/22/2019   Lab Results  Component Value Date   HGBA1C 5.6 05/21/2019   Lab Results  Component Value Date   [id number] 11/07/2019   Lab Results  Component Value Date   TSH 2.022 05/16/2019   ASSESSMENT AND PLAN 79 y.o. year old male  has a past medical history of (HFpEF) heart failure with preserved ejection fraction (HCC), Atrial fibrillation ([place]), BPH (benign prostatic hyperplasia), CAD (coronary artery disease),  native coronary artery, DM (diabetes mellitus) ([place]), Hyperlipidemia type II, Hypertension, [name] (monoclonal gammopathy of unknown significance) (07/31/2018), OSA (obstructive sleep apnea), Pacemaker, Peripheral neuropathy (07/31/2018), Pulmonary fibrosis ([place]), and Subdural hematoma ([place]) (07/2012). here with:  1.  Memory loss 2.  Obstructive sleep apnea on CPAP 3.  Frequent falls 4.  Hallucinations  His memory remains stable.  He continues to have frequent daytime sleepiness, despite using CPAP.  He had genetic narcolepsy testing that was negative.  He only sleeps for about 3 to 4 hours every night, but has excessive daytime sleepiness he feels.  I discussed with Dr. Brett Fairy, I will reorder home sleep study.  Would not consider modafinil, due to history of atrial fibrillation.  He continues to have frequent falls, despite multiple rounds of physical therapy for gait training. We discussed he has to use his walker when he is ambulating, for his safety!  Fortunately, his hallucinations have resolved, will stay off Aricept. He will try melatonin, to hopefully get  his morning/night regimen straightened out. If that isn't helpful, I would add trazodone, may need to decrease Zoloft at that time.  He has a follow-up appointment in March with Jinny Blossom, he will keep this appointment.  I did advise if his symptoms worsen or if he develops any new symptoms he should let us know.   I spent 25 minutes with the patient. 50% of this time was spent discussing his plan of care.   Butler Denmark, AGNP-C, DNP 12/30/2019, 12:25 PM [facility] [address], [place], [place] [address] [phone]

## 2019-12-29 NOTE — Telephone Encounter (Signed)
I called pt, spoke to wife.  I relayed that pt just seen in 11/18/19 with MM/NP for sleep and memory.  Checking to see if needed appt.  Wife stated that she is falling asleep when eating, has had numerous falls (uses walker) no dizziness.  She wanted to keep this appt.

## 2019-12-30 ENCOUNTER — Ambulatory Visit (INDEPENDENT_AMBULATORY_CARE_PROVIDER_SITE_OTHER): Payer: Medicare Other | Admitting: Neurology

## 2019-12-30 ENCOUNTER — Other Ambulatory Visit: Payer: Self-pay

## 2019-12-30 ENCOUNTER — Encounter: Payer: Self-pay | Admitting: Neurology

## 2019-12-30 VITALS — BP 101/53 | HR 60 | Temp 97.4°F | Ht 68.0 in | Wt 187.6 lb

## 2019-12-30 DIAGNOSIS — G4733 Obstructive sleep apnea (adult) (pediatric): Secondary | ICD-10-CM

## 2019-12-30 DIAGNOSIS — G4719 Other hypersomnia: Secondary | ICD-10-CM | POA: Diagnosis not present

## 2019-12-30 DIAGNOSIS — Z79899 Other long term (current) drug therapy: Secondary | ICD-10-CM

## 2019-12-30 NOTE — Patient Instructions (Signed)
I will discuss with Dr. Brett Fairy regarding sleep study, please be careful not to fall, use your walker at all times.

## 2019-12-30 NOTE — Progress Notes (Signed)
I have read the note, and I agree with the clinical assessment and plan.  Kyria Bumgardner K Tesia Lybrand   

## 2019-12-31 ENCOUNTER — Other Ambulatory Visit: Payer: Self-pay | Admitting: Neurology

## 2019-12-31 DIAGNOSIS — I480 Paroxysmal atrial fibrillation: Secondary | ICD-10-CM

## 2019-12-31 DIAGNOSIS — G4733 Obstructive sleep apnea (adult) (pediatric): Secondary | ICD-10-CM

## 2019-12-31 DIAGNOSIS — R413 Other amnesia: Secondary | ICD-10-CM

## 2019-12-31 DIAGNOSIS — G4719 Other hypersomnia: Secondary | ICD-10-CM

## 2020-01-05 ENCOUNTER — Telehealth: Payer: Self-pay

## 2020-01-05 NOTE — Progress Notes (Signed)
Cardiology Office Note   Date:  01/05/2020   ID:  Jamie Singleton., DOB 03-Mar-1941, MRN [id number]  PCP:  [name], MD  Cardiologist:  Lubertha South  No chief complaint on file.    History of Present Illness: Jamie Oswell Brackin. is a 79 y.o. male who presents for ongoing assessment and management of CAD, with CABG (LIMA-LAD, SVG-OM 2, SVG-PDA) along with modified Cox Maze IV procedure in 12/2007.  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 Abdominal US 3/12: No aneurysm. 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 He underwent craniotomy and evacuation by Dr. Saintclair [name]. He was taken off of Coumadin and no longer felt to be an anticoagulation candidate.Multaq DCed previously as felt causing pulmonary toxixity. Nuclear study 5/18 showed EF 59 with normal perfusion.  EchocardiogramMay 2020 showed ejection fraction 50 to 55%, moderate right ventricular enlargement, mildly reduced RV function, severe tricuspid regurgitation.ABIs June 2020 normal. Carotid Dopplers June 2020 showed 1 to 39% bilateral stenosis. Transcranial Dopplers June 2020 negative.Chest CT August 2020 showed mild to moderate pulmonary fibrosis. Pulmonary function test November 2020 normal.  Since last seen,patient has noticed increased lower extremity edema.  He has dyspnea on exertion.  No chest pain or syncope.  He continues to have problems with balance and falls.  He was last seen by Dr. Stanford [name] on 12/10/2019.  At that time torsemide was increased to 20 mg BID. No other medication changes or testing was planned. Could potentially decrease metoprolol if BP was soft.   He has fallen again since being seen last. He did quality for a wheel chair but his wife states that it is too heavy to use, and she cannot help him get into it if he falls. He was recommended for a transport wheelchair. His weight is down 5 lbs since going up on the torsemide. Unfortunately,  he is not taking his medications regularly.  He is forgetful.   Past Medical History:  Diagnosis Date  . (HFpEF) heart failure with preserved ejection fraction ([place])    a. 05/2013 Echo: EF 55%, mild LVH, diast dysfxn, Ao sclerosis, mildly dil LA, RV dysfxn (poorly visualized), PASP 8mmHg;  b. 03/2017 Echo: EF 55-60%, no rwma, triv MR, mildly dil RV, mod TR, PASP 67mmHg.  . Atrial fibrillation [facility])    s/p Cox Maze 1/09;  Multaq Rx d/c'd in 2014 due to pulmo fibrosis;  coumadin d/c'd in 2014 due to spontaneous subdural hematoma  . BPH (benign prostatic hyperplasia)   . CAD (coronary artery disease), native coronary artery    a. s/p CABG 12/2007;  b. Myoview 12/2011: EF 66%, no scar or ischemia; normal.  . DM (diabetes mellitus) ([place])   . Hyperlipidemia type II   . Hypertension   . 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 OSA (obstructive sleep apnea)   . Pacemaker    PPM - St. Jude  . Peripheral neuropathy 07/31/2018  . Pulmonary fibrosis ([place])    Multaq d/c'd 7/14  . Subdural hematoma ([place]) 07/2012   spontaneous;  coumadin d/c'd => no longer a candidate for anticoagulation    Past Surgical History:  Procedure Laterality Date  . AMPUTATION Left 05/17/2019   Procedure: AMPUTATION LEFT FOURTH TOE;  Surgeon: Meredith Pel, MD;  Location: [place];  Service: Orthopedics;  Laterality: Left;  . APPENDECTOMY    . CHOLECYSTECTOMY    . CORONARY ARTERY BYPASS GRAFT     x3 (left internal mammary  artery to distal left anterior descending coronary artery, saphenous vain graft to second circumflex marginal branch, saphenous vain graft to posterior descending coronary artery, endoscopic saphenous vain harvest from right thigh) and modified Cox - Maze IV procedure.  [name],MD. Electronically signed CHO/MEDQ D: 01/09/2008/ JOB: [id number] cc:  [name]  . [name]  07/30/2012   Procedure: CRANIOTOMY HEMATOMA EVACUATION SUBDURAL;  Surgeon: [name],  MD;  Location: [place] [facility];  Service: Neurosurgery;  Laterality: Right;  Right craniotomy for evacuation of subdural hematoma  . FOOT SURGERY    . HERNIA REPAIR    . ORCHIECTOMY     Left  /  testicular cancer  . PACEMAKER PLACEMENT     PPM - St. Jude  . PPM GENERATOR CHANGEOUT N/A 06/25/2019   Procedure: PPM GENERATOR CHANGEOUT;  Surgeon: Evans Lance, MD;  Location: [place] [facility];  Service: Cardiovascular;  Laterality: N/A;     Current Outpatient Medications  Medication Sig Dispense Refill  . acetaminophen (TYLENOL) 325 MG tablet Take 1-2 tablets (325-650 mg total) by mouth every 4 (four) hours as needed for mild pain. (Patient taking differently: Take 325-650 mg by mouth as needed for mild pain. )    . allopurinol (ZYLOPRIM) 300 MG tablet Take 300 mg by mouth daily.     [name] gabapentin (NEURONTIN) 300 MG capsule TAKE 1 CAPSULE BY MOUTH THREE TIMES A DAY 90 capsule 1  . metoprolol tartrate (LOPRESSOR) 50 MG tablet Take 1 tablet (50 mg total) by mouth 2 (two) times daily. 60 tablet 1  . mirabegron ER (MYRBETRIQ) 50 MG TB24 tablet Take 1 tablet (50 mg total) by mouth daily. 30 tablet 3  . pantoprazole (PROTONIX) 40 MG tablet Take 40 mg by mouth daily.    . potassium chloride SA (K-DUR) 20 MEQ tablet Take 1 tablet (20 mEq total) by mouth daily. 30 tablet 1  . rosuvastatin (CRESTOR) 40 MG tablet Take 1 tablet (40 mg total) by mouth daily. (Patient taking differently: Take 40 mg by mouth every evening. ) 90 tablet 2  . sertraline (ZOLOFT) 100 MG tablet Take 200 mg by mouth daily.     . tamsulosin (FLOMAX) 0.4 MG CAPS capsule Take 0.4 mg by mouth at bedtime.     . torsemide (DEMADEX) 20 MG tablet Take 1 tablet (20 mg total) by mouth 2 (two) times daily. 180 tablet 3   Current Facility-Administered Medications  Medication Dose Route Frequency Provider Last Rate Last Admin  . mupirocin ointment (BACTROBAN) 2 %   Topical BID Magnant, Charles L, PA-C        Allergies:   Lipitor  [atorvastatin], Nsaids, Warfarin and related, and Enbrel [etanercept]    Social History:  The patient  reports that he quit smoking about 28 years ago. His smoking use included cigarettes. He has a 40.00 pack-year smoking history. He has never used smokeless tobacco. He reports that he does not drink alcohol or use drugs.   Family History:  The patient's family history includes Alzheimer's disease in his mother; Dementia in his mother; Heart attack in his father; Heart disease in his father and mother; Heart failure in his father.    ROS: All other systems are reviewed and negative. Unless otherwise mentioned in H&P    PHYSICAL EXAM: VS:  There were no vitals taken for this visit. , BMI There is no height or weight on file to calculate BMI. GEN: Well nourished, well developed, in no acute distress  HEENT: normal Neck: no JVD, carotid bruits, or masses, wearing neck brace.  Cardiac: IRRR; no murmurs, rubs, or gallops, bilateral dependent edema  Respiratory:  Clear to auscultation bilaterally, normal work of breathing GI: soft, nontender, nondistended, + BS MS: no deformity or atrophy Skin: warm and dry, no rash Neuro:  Strength and sensation are intact Psych: euthymic mood, full affect   EKG:  Not completed this office visit.   Recent Labs: 05/16/2019: B Natriuretic Peptide 289.1; TSH 2.022 09/09/2019: Magnesium 2.3 09/25/2019: ALT 20; BUN 20; Creatinine, Ser 1.37; Potassium 3.6; Sodium 141 11/07/2019: Hemoglobin 11.1; Platelets 88    Lipid Panel    Component Value Date/Time   CHOL 103 05/22/2019 0338   CHOL 119 09/24/2017 0824   TRIG 58 05/22/2019 0338   HDL 40 (L) 05/22/2019 0338   HDL 42 09/24/2017 0824   CHOLHDL 2.6 05/22/2019 0338   VLDL 12 05/22/2019 0338   LDLCALC 51 05/22/2019 0338   LDLCALC 58 09/24/2017 0824   LDLDIRECT 154.2 10/20/2013 0846      Wt Readings from Last 3 Encounters:  12/30/19 187 lb 9.6 oz (85.1 kg)  12/10/19 193 lb 12.8 oz (87.9 kg)  12/04/19  193 lb 6.4 oz (87.7 kg)      Other studies Reviewed: Echocardiogram 05/24/19 1. The left ventricle has low normal systolic function, with an ejection fraction of 50-55%. The cavity size was normal. Left ventricular diastolic Doppler parameters are indeterminate.  2. LVEF is approximately 50 to 55% with septal hypokinesis. Poor.  3. The right ventricle was not well visualized. The cavity was moderately enlarged. There is no increase in right ventricular wall thickness.  4. RV is difficult to visualize, even with Definity It appears dilated and function is at least mildly reduced.  5. The mitral valve is abnormal. Mild thickening of the mitral valve leaflet.  6. The tricuspid valve is grossly normal. Tricuspid valve regurgitation is severe.  7. The aortic valve was not well visualized. Mild thickening of the aortic valve. Mild calcification of the aortic valve.  8. The inferior vena cava was dilated in size with <50% respiratory variability.  9. The interatrial septum was not assessed.   ASSESSMENT AND PLAN:  1.  Chronic Right Sided CHF: He has done better with higher dose of torsemide.  Unfortunately, he often forgets to take it. His wife states that she will be the one to make sure he takes his medications as directed. . They will have BMET in 2 weeks for evaluation of kidney function.   2. PAF: He is not a candidate for anticoagulation in the setting of spontaneous  He remains hypotensive. Will decrease metoprolol to 25 mg BID. He is often very fatigued and falls asleep easily on higher dose. Perhaps decreasing BB will be helpful in his fatigue and allow for higher BP, thereby preventing falls.   3.Frequent falls: Due to severe generalized weakness, mobility limitations which cannot be resolved with cane, crutch or walker, reduced arm strength, patent cannot safely propel heavy standard weight wheelchair.  Will order a TRANSPORT wheelchair for assistance in ambulation for better  maneuverability with physical strength and endurance limitations.   4. Pacemaker in situ: He will have ongoing interrogations as per protocol.  If HR is not well controlled, consider adding digoxin. Will review labs concerning kidney function.   Current medicines are reviewed at length with the patient today.    Labs/ tests ordered today include: BMET  Phill Myron. West Pugh, ANP, AACC  01/05/2020 1:37 PM    [facility] [address] Office [phone] Fax [phone]  Notice: This dictation was prepared with Dragon dictation along with smaller phrase technology. Any transcriptional errors that result from this process are unintentional and may not be corrected upon review.

## 2020-01-05 NOTE — Telephone Encounter (Signed)
Called patient and explained that we could not do a HST due to he had sleep study in 2020 and insurance will not pay.I told him I would reach out to Dr. Brett Fairy and see what we can do to help. He is having daytime sleepiness after using cpap. His download is good and shows AHI of 1.6. His apnea is treated but still has excessive daytime sleepiness. Do you want to do an MSLT?

## 2020-01-06 ENCOUNTER — Other Ambulatory Visit: Payer: Self-pay | Admitting: Neurology

## 2020-01-06 DIAGNOSIS — I480 Paroxysmal atrial fibrillation: Secondary | ICD-10-CM

## 2020-01-06 DIAGNOSIS — G4733 Obstructive sleep apnea (adult) (pediatric): Secondary | ICD-10-CM

## 2020-01-06 DIAGNOSIS — R413 Other amnesia: Secondary | ICD-10-CM

## 2020-01-06 DIAGNOSIS — G4719 Other hypersomnia: Secondary | ICD-10-CM

## 2020-01-06 NOTE — Telephone Encounter (Signed)
Order is placed for the patient. May need to review medications behind me but patient would need to wean off gabapentin and zoloft for the study

## 2020-01-06 NOTE — Telephone Encounter (Signed)
Lets do PSG with MSLT to folow.

## 2020-01-07 ENCOUNTER — Ambulatory Visit (INDEPENDENT_AMBULATORY_CARE_PROVIDER_SITE_OTHER): Payer: Medicare Other | Admitting: Adult Health

## 2020-01-07 ENCOUNTER — Encounter: Payer: Self-pay | Admitting: Adult Health

## 2020-01-07 ENCOUNTER — Other Ambulatory Visit: Payer: Self-pay

## 2020-01-07 VITALS — BP 108/69 | HR 60 | Temp 97.3°F | Ht 68.0 in | Wt 180.8 lb

## 2020-01-07 DIAGNOSIS — R413 Other amnesia: Secondary | ICD-10-CM | POA: Diagnosis not present

## 2020-01-07 DIAGNOSIS — I10 Essential (primary) hypertension: Secondary | ICD-10-CM | POA: Diagnosis not present

## 2020-01-07 DIAGNOSIS — R29898 Other symptoms and signs involving the musculoskeletal system: Secondary | ICD-10-CM | POA: Diagnosis not present

## 2020-01-07 DIAGNOSIS — R296 Repeated falls: Secondary | ICD-10-CM

## 2020-01-07 DIAGNOSIS — Z950 Presence of cardiac pacemaker: Secondary | ICD-10-CM

## 2020-01-07 DIAGNOSIS — I50812 Chronic right heart failure: Secondary | ICD-10-CM | POA: Diagnosis not present

## 2020-01-07 DIAGNOSIS — I480 Paroxysmal atrial fibrillation: Secondary | ICD-10-CM | POA: Diagnosis not present

## 2020-01-07 MED ORDER — METOPROLOL TARTRATE 25 MG PO TABS: 25.0000 mg | ORAL_TABLET | Freq: Two times a day (BID) | ORAL | 3 refills | Status: DC

## 2020-01-07 NOTE — Patient Instructions (Signed)
Medication Instructions:  DECREASE- Metoprolol 25 mg by mouth twice a day  *If you need a refill on your cardiac medications before your next appointment, please call your pharmacy*  Lab Work: BMP in 2 weeks  If you have labs (blood work) drawn today and your tests are completely normal, you will receive your results only by: [name] MyChart Message (if you have MyChart) OR . A paper copy in the mail If you have any lab test that is abnormal or we need to change your treatment, we will call you to review the results.  Testing/Procedures: None ordered  Follow-Up: At [facility], you and your health needs are our priority.  As part of our continuing mission to provide you with exceptional heart care, we have created designated Provider Care Teams.  These Care Teams include your primary Cardiologist (physician) and Advanced Practice Providers (APPs -  Physician Assistants and Nurse Practitioners) who all work together to provide you with the care you need, when you need it.  Your next appointment:   3 month(s)  The format for your next appointment:   In Person  Provider:   Kirk Ruths, MD

## 2020-01-14 DIAGNOSIS — M542 Cervicalgia: Secondary | ICD-10-CM | POA: Diagnosis not present

## 2020-01-14 DIAGNOSIS — S128XXD Fracture of other parts of neck, subsequent encounter: Secondary | ICD-10-CM | POA: Diagnosis not present

## 2020-01-26 ENCOUNTER — Other Ambulatory Visit (HOSPITAL_COMMUNITY)
Admission: RE | Admit: 2020-01-26 | Discharge: 2020-01-26 | Disposition: A | Discharge status: Home / Self Care | Payer: Medicare Other | Source: Ambulatory Visit | Attending: Neurology | Admitting: Neurology

## 2020-01-26 DIAGNOSIS — Z20822 Contact with and (suspected) exposure to covid-19: Secondary | ICD-10-CM | POA: Insufficient documentation

## 2020-01-26 DIAGNOSIS — Z01812 Encounter for preprocedural laboratory examination: Secondary | ICD-10-CM | POA: Diagnosis not present

## 2020-01-26 LAB — SARS CORONAVIRUS 2 (TAT 6-24 HRS): SARS Coronavirus 2: NEGATIVE

## 2020-01-26 NOTE — Progress Notes (Signed)
HPI:FU CAD and atrial fibrillation. He underwent CABG (LIMA-LAD, SVG-OM 2, SVG-PDA) along with modified Cox Maze IV procedure in 12/2007. 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 Abdominal US 3/12: No aneurysm. 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 He underwent craniotomy and evacuation by Dr. Saintclair Halsted. He was taken off of Coumadin and no longer felt to be an anticoagulation candidate.Multaq DCed previously as felt causing pulmonary toxixity. Nuclear study 5/18 showed EF 59 with normal perfusion. EchocardiogramMay 2020 showed ejection fraction 50 to 55%, moderate right ventricular enlargement, mildly reduced RV function, severe tricuspid regurgitation.ABIs June 2020 normal. Carotid Dopplers June 2020 showed 1 to 39% bilateral stenosis. Transcranial Dopplers June 2020 negative.Chest CT August 2020 showed mild to moderate pulmonary fibrosis. Pulmonary function test November 2020 normal.  Since last seen,he has dyspnea on exertion unchanged.  His pedal edema is stable.  He denies chest pain, palpitations or syncope.  Current Outpatient Medications  Medication Sig Dispense Refill  . acetaminophen (TYLENOL) 325 MG tablet Take 1-2 tablets (325-650 mg total) by mouth every 4 (four) hours as needed for mild pain. (Patient taking differently: Take 325-650 mg by mouth as needed for mild pain. )    . allopurinol (ZYLOPRIM) 300 MG tablet Take 300 mg by mouth daily.     . furosemide (LASIX) 40 MG tablet furosemide 40 mg tablet  TAKE 1 1/2 TABLETS (60 MG TOTAL) BY MOUTH 2 (TWO) TIMES DAILY.    [name] gabapentin (NEURONTIN) 300 MG capsule TAKE 1 CAPSULE BY MOUTH THREE TIMES A DAY 90 capsule 1  . metoprolol tartrate (LOPRESSOR) 25 MG tablet Take 1 tablet (25 mg total) by mouth 2 (two) times daily. 180 tablet 3  . mirabegron ER (MYRBETRIQ) 50 MG TB24 tablet Take 1 tablet (50 mg total) by mouth daily. 30 tablet 3  . pantoprazole  (PROTONIX) 40 MG tablet Take 40 mg by mouth daily.    . potassium chloride SA (K-DUR) 20 MEQ tablet Take 1 tablet (20 mEq total) by mouth daily. 30 tablet 1  . rosuvastatin (CRESTOR) 40 MG tablet Take 1 tablet (40 mg total) by mouth daily. (Patient taking differently: Take 40 mg by mouth every evening. ) 90 tablet 2  . sertraline (ZOLOFT) 100 MG tablet Take 200 mg by mouth daily.     . tamsulosin (FLOMAX) 0.4 MG CAPS capsule Take 0.4 mg by mouth at bedtime.     . torsemide (DEMADEX) 20 MG tablet Take 1 tablet (20 mg total) by mouth 2 (two) times daily. 180 tablet 3   Current Facility-Administered Medications  Medication Dose Route Frequency Provider Last Rate Last Admin  . mupirocin ointment (BACTROBAN) 2 %   Topical BID Magnant, Gerrianne Scale, PA-C         Past Medical History:  Diagnosis Date  . (HFpEF) heart failure with preserved ejection fraction ([place])    a. 05/2013 Echo: EF 55%, mild LVH, diast dysfxn, Ao sclerosis, mildly dil LA, RV dysfxn (poorly visualized), PASP 36mmHg;  b. 03/2017 Echo: EF 55-60%, no rwma, triv MR, mildly dil RV, mod TR, PASP 48mmHg.  . Atrial fibrillation [facility])    s/p Cox Maze 1/09;  Multaq Rx d/c'd in 2014 due to pulmo fibrosis;  coumadin d/c'd in 2014 due to spontaneous subdural hematoma  . BPH (benign prostatic hyperplasia)   . CAD (coronary artery disease), native coronary artery    a. s/p CABG 12/2007;  b. Myoview 12/2011: EF 66%, no scar or ischemia;  normal.  . DM (diabetes mellitus) ([place])   . Hyperlipidemia type II   . Hypertension   . 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 OSA (obstructive sleep apnea)   . Pacemaker    PPM - St. Jude  . Peripheral neuropathy 07/31/2018  . Pulmonary fibrosis ([place])    Multaq d/c'd 7/14  . Subdural hematoma ([place]) 07/2012   spontaneous;  coumadin d/c'd => no longer a candidate for anticoagulation    Past Surgical History:  Procedure Laterality Date  . AMPUTATION Left 05/17/2019    Procedure: AMPUTATION LEFT FOURTH TOE;  Surgeon: Meredith Pel, MD;  Location: [place];  Service: Orthopedics;  Laterality: Left;  . APPENDECTOMY    . CHOLECYSTECTOMY    . CORONARY ARTERY BYPASS GRAFT     x3 (left internal mammary artery to distal left anterior descending coronary artery, saphenous vain graft to second circumflex marginal branch, saphenous vain graft to posterior descending coronary artery, endoscopic saphenous vain harvest from right thigh) and modified Cox - Maze IV procedure.  [name],MD. Electronically signed CHO/MEDQ D: 01/09/2008/ JOB: [id number] cc:  [name]  . [name]  07/30/2012   Procedure: CRANIOTOMY HEMATOMA EVACUATION SUBDURAL;  Surgeon: [name], MD;  Location: [place] [facility];  Service: Neurosurgery;  Laterality: Right;  Right craniotomy for evacuation of subdural hematoma  . FOOT SURGERY    . HERNIA REPAIR    . ORCHIECTOMY     Left  /  testicular cancer  . PACEMAKER PLACEMENT     PPM - St. Jude  . PPM GENERATOR CHANGEOUT N/A 06/25/2019   Procedure: PPM GENERATOR CHANGEOUT;  Surgeon: Evans Lance, MD;  Location: [place] [facility];  Service: Cardiovascular;  Laterality: N/A;    Social History   Socioeconomic History  . Marital status: Married    Spouse name: [name]  . Number of children: 2  . Years of education: Not on file  . Highest education level: Not on file  Occupational History  . Occupation: Retired- IT trainer  Tobacco Use  . Smoking status: Former Smoker    Packs/day: 2.00    Years: 20.00    Pack years: 40.00    Types: Cigarettes    Quit date: 02/21/1991    Years since quitting: 28.9  . Smokeless tobacco: Never Used  Substance and Sexual Activity  . Alcohol use: No    Alcohol/week: 0.0 standard drinks  . Drug use: No  . Sexual activity: Not Currently  Other Topics Concern  . Not on file  Social History Narrative   Lives with wife   Right handed    Married with two children.     He is a Engineer, structural.      Social Determinants of Health   Financial Resource Strain:   . Difficulty of Paying Living Expenses: Not on file  Food Insecurity:   . Worried About Charity fundraiser in the Last Year: Not on file  . Ran Out of Food in the Last Year: Not on file  Transportation Needs:   . Lack of Transportation (Medical): Not on file  . Lack of Transportation (Non-Medical): Not on file  Physical Activity:   . Days of Exercise per Week: Not on file  . Minutes of Exercise per Session: Not on file  Stress:   . Feeling of Stress : Not on file  Social Connections:   . Frequency of Communication with Friends and Family: Not  on file  . Frequency of Social Gatherings with Friends and Family: Not on file  . Attends Religious Services: Not on file  . Active Member of Clubs or Organizations: Not on file  . Attends Archivist Meetings: Not on file  . Marital Status: Not on file  Intimate Partner Violence:   . Fear of Current or Ex-Partner: Not on file  . Emotionally Abused: Not on file  . Physically Abused: Not on file  . Sexually Abused: Not on file    Family History  Problem Relation Age of Onset  . Heart disease Father   . Heart attack Father   . Heart failure Father   . Heart disease Mother   . Alzheimer's disease Mother   . Dementia Mother     ROS: no fevers or chills, productive cough, hemoptysis, dysphasia, odynophagia, melena, hematochezia, dysuria, hematuria, rash, seizure activity, orthopnea, PND, pedal edema, claudication. Remaining systems are negative.  Physical Exam: Well-developed well-nourished in no acute distress.  Skin is warm and dry.  HEENT is normal.  Neck is supple.  Chest is clear to auscultation with normal expansion.  Cardiovascular exam is regular rate and rhythm.  Abdominal exam nontender or distended. No masses palpated. Extremities show trace to 1+ edema. neuro grossly intact   A/P  1 chronic right-sided congestive heart failure-plan to continue  Demadex at present dose.  We are trying to balance keeping him euvolemic versus causing hypotension which has been problematic previously.  His right-sided congestive heart failure is felt secondary to a combination of pulmonary venous hypertension, pulmonary fibrosis and sleep apnea.  Check potassium and renal function.  2 paroxysmal atrial fibrillation-continue beta-blocker.  Patient is not on anticoagulation as he has a history of spontaneous subdural hematoma.  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  4 pacemaker-followed by electrophysiology.  5 severe tricuspid regurgitation-likely secondary to right ventricular enlargement related to pulmonary hypertension versus trauma from pacemaker.  Patient is not a surgical candidate given multiple comorbidities.  6 coronary artery disease-continue statin.  Not on aspirin given history of spontaneous intracranial hemorrhage.  7 hyperlipidemia-continue statin.  8 history of cirrhosis-managed by primary care.  9 pulmonary fibrosis-Per pulmonary.  10 chronic anemia/thrombocytopenia-felt related to cirrhosis and splenomegaly, anemia of chronic disease and renal insufficiency.  Followed by hematology.  Kirk Ruths, MD

## 2020-01-27 NOTE — Progress Notes (Signed)
Negative corona virus test pre- PSG.

## 2020-01-28 ENCOUNTER — Other Ambulatory Visit: Payer: Self-pay

## 2020-01-28 ENCOUNTER — Ambulatory Visit (INDEPENDENT_AMBULATORY_CARE_PROVIDER_SITE_OTHER): Payer: Medicare Other | Admitting: Neurology

## 2020-01-28 DIAGNOSIS — R4189 Other symptoms and signs involving cognitive functions and awareness: Secondary | ICD-10-CM

## 2020-01-28 DIAGNOSIS — G4710 Hypersomnia, unspecified: Secondary | ICD-10-CM

## 2020-01-28 DIAGNOSIS — G4733 Obstructive sleep apnea (adult) (pediatric): Secondary | ICD-10-CM

## 2020-01-28 DIAGNOSIS — R413 Other amnesia: Secondary | ICD-10-CM

## 2020-01-28 DIAGNOSIS — I480 Paroxysmal atrial fibrillation: Secondary | ICD-10-CM

## 2020-01-28 DIAGNOSIS — G4719 Other hypersomnia: Secondary | ICD-10-CM

## 2020-01-29 ENCOUNTER — Other Ambulatory Visit: Payer: Self-pay | Admitting: Neurology

## 2020-01-29 ENCOUNTER — Ambulatory Visit (INDEPENDENT_AMBULATORY_CARE_PROVIDER_SITE_OTHER): Payer: Medicare Other | Admitting: Neurology

## 2020-01-29 DIAGNOSIS — G4719 Other hypersomnia: Secondary | ICD-10-CM | POA: Diagnosis not present

## 2020-01-29 DIAGNOSIS — I480 Paroxysmal atrial fibrillation: Secondary | ICD-10-CM

## 2020-01-29 DIAGNOSIS — Z79899 Other long term (current) drug therapy: Secondary | ICD-10-CM

## 2020-01-29 DIAGNOSIS — G4733 Obstructive sleep apnea (adult) (pediatric): Secondary | ICD-10-CM

## 2020-01-29 DIAGNOSIS — R413 Other amnesia: Secondary | ICD-10-CM

## 2020-01-29 DIAGNOSIS — G4712 Idiopathic hypersomnia without long sleep time: Secondary | ICD-10-CM | POA: Diagnosis not present

## 2020-01-29 NOTE — Addendum Note (Signed)
Addended by: Inis Sizer D on: 01/29/2020 04:20 PM   Modules accepted: Orders

## 2020-02-02 LAB — COMPREHENSIVE DRUG ANALYSIS,UR

## 2020-02-03 ENCOUNTER — Inpatient Hospital Stay (HOSPITAL_COMMUNITY)
Admission: EM | Admit: 2020-02-03 | Discharge: 2020-02-09 | DRG: 481 | Disposition: A | Discharge status: Rehabilitation Facility | Payer: Medicare Other | Attending: Internal Medicine | Admitting: Internal Medicine

## 2020-02-03 ENCOUNTER — Encounter (HOSPITAL_COMMUNITY): Payer: Self-pay | Admitting: Emergency Medicine

## 2020-02-03 ENCOUNTER — Encounter: Payer: Self-pay | Admitting: Cardiology

## 2020-02-03 ENCOUNTER — Other Ambulatory Visit: Payer: Self-pay

## 2020-02-03 ENCOUNTER — Emergency Department (HOSPITAL_COMMUNITY): Payer: Medicare Other

## 2020-02-03 ENCOUNTER — Ambulatory Visit (INDEPENDENT_AMBULATORY_CARE_PROVIDER_SITE_OTHER): Payer: Medicare Other | Admitting: Cardiology

## 2020-02-03 VITALS — BP 110/52 | HR 107 | Temp 97.3°F | Ht 68.0 in | Wt 183.4 lb

## 2020-02-03 DIAGNOSIS — L97519 Non-pressure chronic ulcer of other part of right foot with unspecified severity: Secondary | ICD-10-CM | POA: Diagnosis present

## 2020-02-03 DIAGNOSIS — S51011A Laceration without foreign body of right elbow, initial encounter: Secondary | ICD-10-CM | POA: Diagnosis not present

## 2020-02-03 DIAGNOSIS — Z03818 Encounter for observation for suspected exposure to other biological agents ruled out: Secondary | ICD-10-CM | POA: Diagnosis not present

## 2020-02-03 DIAGNOSIS — Z951 Presence of aortocoronary bypass graft: Secondary | ICD-10-CM

## 2020-02-03 DIAGNOSIS — D696 Thrombocytopenia, unspecified: Secondary | ICD-10-CM | POA: Diagnosis present

## 2020-02-03 DIAGNOSIS — I5082 Biventricular heart failure: Secondary | ICD-10-CM | POA: Diagnosis present

## 2020-02-03 DIAGNOSIS — I2510 Atherosclerotic heart disease of native coronary artery without angina pectoris: Secondary | ICD-10-CM | POA: Diagnosis present

## 2020-02-03 DIAGNOSIS — Z20822 Contact with and (suspected) exposure to covid-19: Secondary | ICD-10-CM | POA: Diagnosis not present

## 2020-02-03 DIAGNOSIS — M25572 Pain in left ankle and joints of left foot: Secondary | ICD-10-CM | POA: Diagnosis not present

## 2020-02-03 DIAGNOSIS — Y92009 Unspecified place in unspecified non-institutional (private) residence as the place of occurrence of the external cause: Secondary | ICD-10-CM

## 2020-02-03 DIAGNOSIS — S72141A Displaced intertrochanteric fracture of right femur, initial encounter for closed fracture: Secondary | ICD-10-CM | POA: Diagnosis present

## 2020-02-03 DIAGNOSIS — S51019A Laceration without foreign body of unspecified elbow, initial encounter: Secondary | ICD-10-CM | POA: Diagnosis present

## 2020-02-03 DIAGNOSIS — J8410 Pulmonary fibrosis, unspecified: Secondary | ICD-10-CM | POA: Diagnosis present

## 2020-02-03 DIAGNOSIS — E1142 Type 2 diabetes mellitus with diabetic polyneuropathy: Secondary | ICD-10-CM | POA: Diagnosis present

## 2020-02-03 DIAGNOSIS — I10 Essential (primary) hypertension: Secondary | ICD-10-CM | POA: Diagnosis not present

## 2020-02-03 DIAGNOSIS — I50812 Chronic right heart failure: Secondary | ICD-10-CM

## 2020-02-03 DIAGNOSIS — R52 Pain, unspecified: Secondary | ICD-10-CM | POA: Diagnosis not present

## 2020-02-03 DIAGNOSIS — D638 Anemia in other chronic diseases classified elsewhere: Secondary | ICD-10-CM | POA: Diagnosis present

## 2020-02-03 DIAGNOSIS — I480 Paroxysmal atrial fibrillation: Secondary | ICD-10-CM

## 2020-02-03 DIAGNOSIS — S199XXA Unspecified injury of neck, initial encounter: Secondary | ICD-10-CM | POA: Diagnosis not present

## 2020-02-03 DIAGNOSIS — M25521 Pain in right elbow: Secondary | ICD-10-CM | POA: Diagnosis not present

## 2020-02-03 DIAGNOSIS — I4891 Unspecified atrial fibrillation: Secondary | ICD-10-CM | POA: Diagnosis present

## 2020-02-03 DIAGNOSIS — S0990XA Unspecified injury of head, initial encounter: Secondary | ICD-10-CM | POA: Diagnosis not present

## 2020-02-03 DIAGNOSIS — E785 Hyperlipidemia, unspecified: Secondary | ICD-10-CM | POA: Diagnosis present

## 2020-02-03 DIAGNOSIS — Z419 Encounter for procedure for purposes other than remedying health state, unspecified: Secondary | ICD-10-CM

## 2020-02-03 DIAGNOSIS — M869 Osteomyelitis, unspecified: Secondary | ICD-10-CM | POA: Diagnosis not present

## 2020-02-03 DIAGNOSIS — R296 Repeated falls: Secondary | ICD-10-CM | POA: Diagnosis not present

## 2020-02-03 DIAGNOSIS — S72001A Fracture of unspecified part of neck of right femur, initial encounter for closed fracture: Secondary | ICD-10-CM | POA: Diagnosis present

## 2020-02-03 DIAGNOSIS — I130 Hypertensive heart and chronic kidney disease with heart failure and stage 1 through stage 4 chronic kidney disease, or unspecified chronic kidney disease: Secondary | ICD-10-CM | POA: Diagnosis not present

## 2020-02-03 DIAGNOSIS — M109 Gout, unspecified: Secondary | ICD-10-CM | POA: Diagnosis present

## 2020-02-03 DIAGNOSIS — S59901A Unspecified injury of right elbow, initial encounter: Secondary | ICD-10-CM | POA: Diagnosis not present

## 2020-02-03 DIAGNOSIS — S72144A Nondisplaced intertrochanteric fracture of right femur, initial encounter for closed fracture: Secondary | ICD-10-CM | POA: Diagnosis not present

## 2020-02-03 DIAGNOSIS — S299XXA Unspecified injury of thorax, initial encounter: Secondary | ICD-10-CM | POA: Diagnosis not present

## 2020-02-03 DIAGNOSIS — W010XXA Fall on same level from slipping, tripping and stumbling without subsequent striking against object, initial encounter: Secondary | ICD-10-CM | POA: Diagnosis present

## 2020-02-03 DIAGNOSIS — E11621 Type 2 diabetes mellitus with foot ulcer: Secondary | ICD-10-CM | POA: Diagnosis present

## 2020-02-03 DIAGNOSIS — M79674 Pain in right toe(s): Secondary | ICD-10-CM

## 2020-02-03 DIAGNOSIS — Z950 Presence of cardiac pacemaker: Secondary | ICD-10-CM

## 2020-02-03 DIAGNOSIS — M25551 Pain in right hip: Secondary | ICD-10-CM | POA: Diagnosis not present

## 2020-02-03 DIAGNOSIS — I5032 Chronic diastolic (congestive) heart failure: Secondary | ICD-10-CM | POA: Diagnosis present

## 2020-02-03 DIAGNOSIS — W19XXXA Unspecified fall, initial encounter: Secondary | ICD-10-CM

## 2020-02-03 DIAGNOSIS — N400 Enlarged prostate without lower urinary tract symptoms: Secondary | ICD-10-CM | POA: Diagnosis present

## 2020-02-03 DIAGNOSIS — G4733 Obstructive sleep apnea (adult) (pediatric): Secondary | ICD-10-CM | POA: Diagnosis present

## 2020-02-03 DIAGNOSIS — E1122 Type 2 diabetes mellitus with diabetic chronic kidney disease: Secondary | ICD-10-CM | POA: Diagnosis present

## 2020-02-03 DIAGNOSIS — N1831 Chronic kidney disease, stage 3a: Secondary | ICD-10-CM | POA: Diagnosis present

## 2020-02-03 DIAGNOSIS — Z87891 Personal history of nicotine dependence: Secondary | ICD-10-CM

## 2020-02-03 DIAGNOSIS — E1169 Type 2 diabetes mellitus with other specified complication: Secondary | ICD-10-CM | POA: Diagnosis present

## 2020-02-03 MED ORDER — ONDANSETRON HCL 4 MG/2ML IJ SOLN
4.0000 mg | Freq: Once | INTRAMUSCULAR | Status: AC
  Administered 2020-02-04: 4 mg via INTRAVENOUS
  Filled 2020-02-03: qty 2

## 2020-02-03 MED ORDER — FENTANYL CITRATE (PF) 100 MCG/2ML IJ SOLN
50.0000 ug | Freq: Once | INTRAMUSCULAR | Status: AC
  Administered 2020-02-04: 50 ug via INTRAVENOUS
  Filled 2020-02-03: qty 2

## 2020-02-03 NOTE — Patient Instructions (Signed)
Medication Instructions:  NO CHANGE *If you need a refill on your cardiac medications before your next appointment, please call your pharmacy*  Lab Work: If you have labs (blood work) drawn today and your tests are completely normal, you will receive your results only by: . MyChart Message (if you have MyChart) OR . A paper copy in the mail If you have any lab test that is abnormal or we need to change your treatment, we will call you to review the results.  Follow-Up: At [facility], you and your health needs are our priority.  As part of our continuing mission to provide you with exceptional heart care, we have created designated Provider Care Teams.  These Care Teams include your primary Cardiologist (physician) and Advanced Practice Providers (APPs -  Physician Assistants and Nurse Practitioners) who all work together to provide you with the care you need, when you need it.  Your next appointment:   3 month(s)  The format for your next appointment:   In Person  Provider:   Brian Crenshaw, MD   

## 2020-02-03 NOTE — ED Triage Notes (Signed)
Patient is from home, patient lost balance and fell into wall.  Patient landed on right hip, possible shortening to that leg, has skin tear to right elbow.  No LOC, no blood thinners, HR 104, BP 124/70 and CBG of 113.  GCS 15.

## 2020-02-03 NOTE — ED Provider Notes (Signed)
TIME SEEN: 11:41 PM  CHIEF COMPLAINT: Right hip pain, fall  HPI: Patient is a 79 year old male with history of atrial fibrillation no longer on Coumadin, CAD, hypertension, diabetes, hyperlipidemia, CKD, pulmonary fibrosis who presents to the emergency department today after a fall.  States that he has had months worth of issues with his balance and has had many falls.  States today he was writing something on his calendar and turned around quickly to do something else when he fell against the wall.  He is not sure if he hit his head but states he left a large hole in the wall which may have been from his elbow as he has several skin tears to the elbow.  Fell onto the right side and has pain over the right anterior hip.  Unable to get up off the floor after he fell.  Is in an Aspen collar from cervical spine fracture that he states he obtained 3 to 4 months ago.  Tetanus vaccination up-to-date in the past 1 to 2 years.  No numbness or weakness.  No chest pain, shortness of breath.  No abdominal pain.  No new neck or back pain.  Denies any preceding symptoms to his fall.  Denies any preceding chest pain, shortness of breath, palpitations, dizziness.  Uses walker at baseline per his wife.  Followed by Dr. [name] for cervical fracture.  Would like to stay with [facility].  PCP - Dr. [name]   Wife - [name] - [phone]    ROS: See HPI Constitutional: no fever  Eyes: no drainage  ENT: no runny nose   Cardiovascular:  no chest pain  Resp: no SOB  GI: no vomiting GU: no dysuria Integumentary: no rash  Allergy: no hives  Musculoskeletal: no leg swelling  Neurological: no slurred speech ROS otherwise negative  PAST MEDICAL HISTORY/PAST SURGICAL HISTORY:  Past Medical History:  Diagnosis Date  . (HFpEF) heart failure with preserved ejection fraction ([place])    a. 05/2013 Echo: EF 55%, mild LVH, diast dysfxn, Ao sclerosis, mildly dil LA, RV dysfxn (poorly visualized), PASP 45mmHg;  b.  03/2017 Echo: EF 55-60%, no rwma, triv MR, mildly dil RV, mod TR, PASP 36mmHg.  . Atrial fibrillation [facility])    s/p [facility] 1/09;  Multaq Rx d/c'd in 2014 due to pulmo fibrosis;  coumadin d/c'd in 2014 due to spontaneous subdural hematoma  . BPH (benign prostatic hyperplasia)   . CAD (coronary artery disease), native coronary artery    a. s/p CABG 12/2007;  b. Myoview 12/2011: EF 66%, no scar or ischemia; normal.  . DM (diabetes mellitus) ([place])   . Hyperlipidemia type II   . Hypertension   . 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 OSA (obstructive sleep apnea)   . Pacemaker    PPM - [employer] Jude  . Peripheral neuropathy 07/31/2018  . Pulmonary fibrosis ([place])    Multaq d/c'd 7/14  . Subdural hematoma ([place]) 07/2012   spontaneous;  coumadin d/c'd => no longer a candidate for anticoagulation    MEDICATIONS:  Prior to Admission medications   Medication Sig Start Date End Date Taking? Authorizing Provider  acetaminophen (TYLENOL) 325 MG tablet Take 1-2 tablets (325-650 mg total) by mouth every 4 (four) hours as needed for mild pain. Patient taking differently: Take 325-650 mg by mouth as needed for mild pain.  05/28/19   Love, Ivan Anchors, PA-C  allopurinol (ZYLOPRIM) 300 MG tablet Take 300 mg by mouth daily.  06/21/16   [provider]  furosemide (LASIX) 40 MG tablet furosemide 40 mg tablet  TAKE 1 1/2 TABLETS (60 MG TOTAL) BY MOUTH 2 (TWO) TIMES DAILY.    [provider]  gabapentin (NEURONTIN) 300 MG capsule TAKE 1 CAPSULE BY MOUTH THREE TIMES A DAY 09/08/19   Kirsteins, Luanna Salk, MD  metoprolol tartrate (LOPRESSOR) 25 MG tablet Take 1 tablet (25 mg total) by mouth 2 (two) times daily. 01/07/20   Lendon Colonel, NP  mirabegron ER (MYRBETRIQ) 50 MG TB24 tablet Take 1 tablet (50 mg total) by mouth daily. 10/06/19   Kathrynn Ducking, MD  pantoprazole (PROTONIX) 40 MG tablet Take 40 mg by mouth daily. 02/22/17   [provider]  potassium  chloride SA (K-DUR) 20 MEQ tablet Take 1 tablet (20 mEq total) by mouth daily. 06/04/19   Love, Ivan Anchors, PA-C  rosuvastatin (CRESTOR) 40 MG tablet Take 1 tablet (40 mg total) by mouth daily. Patient taking differently: Take 40 mg by mouth every evening.  12/25/18   [name], MD  sertraline (ZOLOFT) 100 MG tablet Take 200 mg by mouth daily.     [provider]  tamsulosin (FLOMAX) 0.4 MG CAPS capsule Take 0.4 mg by mouth at bedtime.     [provider]  torsemide (DEMADEX) 20 MG tablet Take 1 tablet (20 mg total) by mouth 2 (two) times daily. 12/10/19   [name], MD    ALLERGIES:  Allergies  Allergen Reactions  . Lipitor [Atorvastatin] Other (See Comments)    Stiff joints  . 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 Warfarin And Related Other (See Comments)    Stiff joints  . Enbrel [Etanercept] Itching    SOCIAL HISTORY:  Social History   Tobacco Use  . Smoking status: Former Smoker    Packs/day: 2.00    Years: 20.00    Pack years: 40.00    Types: Cigarettes    Quit date: 02/21/1991    Years since quitting: 28.9  . Smokeless tobacco: Never Used  Substance Use Topics  . Alcohol use: No    Alcohol/week: 0.0 standard drinks    FAMILY HISTORY: Family History  Problem Relation Age of Onset  . Heart disease Father   . Heart attack Father   . Heart failure Father   . Heart disease Mother   . Alzheimer's disease Mother   . Dementia Mother     EXAM: BP 103/66 (BP Location: Right Arm)   Pulse (!) 103   Temp (!) 97.4 F (36.3 C) (Oral)   Resp 18   SpO2 91%  CONSTITUTIONAL: Alert and oriented and responds appropriately to questions.  Elderly, extremely pleasant, in no significant distress HEAD: Normocephalic; atraumatic EYES: Conjunctivae clear, PERRL, EOMI ENT: normal nose; no rhinorrhea; moist mucous membranes; pharynx without lesions noted; no dental injury; no septal hematoma NECK: Supple, no meningismus, no LAD; no  midline spinal tenderness, step-off or deformity; trachea midline, patient in an Aspen collar CARD: Irregularly irregular and minimally tachycardic; S1 and S2 appreciated; no murmurs, no clicks, no rubs, no gallops RESP: Normal chest excursion without splinting or tachypnea; breath sounds clear and equal bilaterally; no wheezes, no rhonchi, no rales; no hypoxia or respiratory distress CHEST:  chest wall stable, no crepitus or ecchymosis or deformity, nontender to palpation; no flail chest ABD/GI: Normal bowel sounds; non-distended; soft, non-tender, no rebound, no guarding; no ecchymosis or other lesions noted PELVIS:  stable, nontender to palpation BACK:  The back appears normal and is non-tender to palpation, there is no CVA tenderness; no midline spinal tenderness, step-off or deformity EXT: Multiple small skin tears but no laceration noted to the right elbow.  Full range of motion in this joint.  Seems to have small amount of bony tenderness but no deformity or effusion.  Also tender over the right lateral and anterior hip.  He keeps the hip and knee on the right side slightly flexed for comfort.  No obvious leg length discrepancy or rotation.  He has 2+ radial and DP pulses bilaterally.  Compartments are all soft.  Otherwise extremities are nontender to palpation. SKIN: Normal color for age and race; warm NEURO: Moves all extremities equally, normal speech, normal station diffusely, no facial asymmetry PSYCH: The patient's mood and manner are appropriate. Grooming and personal hygiene are appropriate.  MEDICAL DECISION MAKING: Patient here after he fell tonight.  States he has history of loss of balance with multiple falls.  Is in an Aspen collar currently for previous cervical spine fracture.  Will obtain CT of the head, cervical spine, x-ray of the right hip and right elbow.  I am concerned for possible hip fracture.  Will obtain screening labs, EKG, urinalysis.  Will provide with pain and nausea  medicine.  States his tetanus vaccination is up-to-date.  Will clean wounds to the right arm and apply sterile dressing.  ED PROGRESS: Patient has a nondisplaced right intertrochanteric fracture. Updated his wife [name] by phone. They have seen Dr. Alma Friendly previously with [facility] for his cervical spine fracture. They would like to stay with [facility]. Will consult on-call physician for [facility]. Labs pending. Will admit to medicine.  CT head and cervical spine show no acute injury.  No fracture or dislocation to the right elbow.  Will clean and dress this wound.  1:10 AM  Spoke with Dr. Rolena Infante with [facility].  Orthopedics will see patient in the morning.  We will keep him n.p.o.  Rapid Covid pending.  1:55 AM  Labs unremarkable other than chronic kidney disease which appears stable.  Will admit to hospitalist.  2:08 AM Discussed patient's case with hospitalist, Dr. Myna Hidalgo.  I have recommended admission and patient (and family if present) agree with this plan. Admitting physician will place admission orders.   I reviewed all nursing notes, vitals, pertinent previous records and interpreted all EKGs, lab and urine results, imaging (as available).    EKG Interpretation  Date/Time:  Tuesday February 03 2020 23:42:20 EST Ventricular Rate:  103 PR Interval:    QRS Duration: 154 QT Interval:  399 QTC Calculation: 523 R Axis:   33 Text Interpretation: Atrial fibrillation Right bundle branch block Borderline ST depression, lateral leads No significant change since last tracing Confirmed by Elis Rawlinson, Cyril Mourning [phone]) on 02/04/2020 12:06:01 AM        CRITICAL CARE Performed by: Cyril Mourning Avan Gullett   Total critical care time: 45 minutes  Critical care time was exclusive of separately billable procedures and treating other patients.  Critical care was necessary to treat or prevent imminent or life-threatening deterioration.  Critical care was time spent personally by me on the following  activities: development of treatment plan with patient and/or surrogate as well as nursing, discussions with consultants, evaluation of patient's response to treatment, examination of patient, obtaining history from patient or surrogate, ordering and performing treatments and interventions, ordering and review of laboratory studies, ordering and review of radiographic studies, pulse oximetry and re-evaluation  of patient's condition.  [name]. was evaluated in Emergency Department on 02/03/2020 for the symptoms described in the history of present illness. He was evaluated in the context of the global COVID-19 pandemic, which necessitated consideration that the patient might be at risk for infection with the SARS-CoV-2 virus that causes COVID-19. Institutional protocols and algorithms that pertain to the evaluation of patients at risk for COVID-19 are in a state of rapid change based on information released by regulatory bodies including the CDC and federal and state organizations. These policies and algorithms were followed during the patient's care in the ED.  Patient was seen wearing N95, face shield, gloves.     Jostin Rue, Delice Bison, DO 02/04/20 [phone]

## 2020-02-04 ENCOUNTER — Encounter (HOSPITAL_COMMUNITY): Payer: Self-pay | Admitting: Family Medicine

## 2020-02-04 ENCOUNTER — Inpatient Hospital Stay (HOSPITAL_COMMUNITY): Payer: Medicare Other

## 2020-02-04 ENCOUNTER — Emergency Department (HOSPITAL_COMMUNITY): Payer: Medicare Other

## 2020-02-04 ENCOUNTER — Encounter (HOSPITAL_COMMUNITY)
Admission: EM | Disposition: A | Discharge status: Rehabilitation Facility | Payer: Self-pay | Source: Home / Self Care | Attending: Internal Medicine

## 2020-02-04 ENCOUNTER — Inpatient Hospital Stay (HOSPITAL_COMMUNITY): Payer: Medicare Other | Admitting: Anesthesiology

## 2020-02-04 ENCOUNTER — Encounter: Payer: Self-pay | Admitting: *Deleted

## 2020-02-04 DIAGNOSIS — D638 Anemia in other chronic diseases classified elsewhere: Secondary | ICD-10-CM

## 2020-02-04 DIAGNOSIS — S72144A Nondisplaced intertrochanteric fracture of right femur, initial encounter for closed fracture: Secondary | ICD-10-CM | POA: Diagnosis not present

## 2020-02-04 DIAGNOSIS — I5032 Chronic diastolic (congestive) heart failure: Secondary | ICD-10-CM | POA: Diagnosis present

## 2020-02-04 DIAGNOSIS — N1831 Chronic kidney disease, stage 3a: Secondary | ICD-10-CM | POA: Diagnosis present

## 2020-02-04 DIAGNOSIS — W1830XD Fall on same level, unspecified, subsequent encounter: Secondary | ICD-10-CM | POA: Diagnosis not present

## 2020-02-04 DIAGNOSIS — M25521 Pain in right elbow: Secondary | ICD-10-CM | POA: Diagnosis not present

## 2020-02-04 DIAGNOSIS — N182 Chronic kidney disease, stage 2 (mild): Secondary | ICD-10-CM | POA: Diagnosis present

## 2020-02-04 DIAGNOSIS — I5082 Biventricular heart failure: Secondary | ICD-10-CM | POA: Diagnosis present

## 2020-02-04 DIAGNOSIS — S72001A Fracture of unspecified part of neck of right femur, initial encounter for closed fracture: Secondary | ICD-10-CM | POA: Diagnosis not present

## 2020-02-04 DIAGNOSIS — S72141S Displaced intertrochanteric fracture of right femur, sequela: Secondary | ICD-10-CM | POA: Diagnosis not present

## 2020-02-04 DIAGNOSIS — D696 Thrombocytopenia, unspecified: Secondary | ICD-10-CM | POA: Diagnosis not present

## 2020-02-04 DIAGNOSIS — I2510 Atherosclerotic heart disease of native coronary artery without angina pectoris: Secondary | ICD-10-CM | POA: Diagnosis present

## 2020-02-04 DIAGNOSIS — G4733 Obstructive sleep apnea (adult) (pediatric): Secondary | ICD-10-CM | POA: Diagnosis present

## 2020-02-04 DIAGNOSIS — Z20822 Contact with and (suspected) exposure to covid-19: Secondary | ICD-10-CM | POA: Diagnosis present

## 2020-02-04 DIAGNOSIS — D472 Monoclonal gammopathy: Secondary | ICD-10-CM | POA: Diagnosis present

## 2020-02-04 DIAGNOSIS — W010XXA Fall on same level from slipping, tripping and stumbling without subsequent striking against object, initial encounter: Secondary | ICD-10-CM | POA: Diagnosis present

## 2020-02-04 DIAGNOSIS — Z87891 Personal history of nicotine dependence: Secondary | ICD-10-CM | POA: Diagnosis not present

## 2020-02-04 DIAGNOSIS — I480 Paroxysmal atrial fibrillation: Secondary | ICD-10-CM | POA: Diagnosis not present

## 2020-02-04 DIAGNOSIS — I110 Hypertensive heart disease with heart failure: Secondary | ICD-10-CM | POA: Diagnosis not present

## 2020-02-04 DIAGNOSIS — S0990XA Unspecified injury of head, initial encounter: Secondary | ICD-10-CM | POA: Diagnosis not present

## 2020-02-04 DIAGNOSIS — K5900 Constipation, unspecified: Secondary | ICD-10-CM | POA: Diagnosis not present

## 2020-02-04 DIAGNOSIS — S59901A Unspecified injury of right elbow, initial encounter: Secondary | ICD-10-CM | POA: Diagnosis not present

## 2020-02-04 DIAGNOSIS — E11621 Type 2 diabetes mellitus with foot ulcer: Secondary | ICD-10-CM | POA: Diagnosis present

## 2020-02-04 DIAGNOSIS — Y92009 Unspecified place in unspecified non-institutional (private) residence as the place of occurrence of the external cause: Secondary | ICD-10-CM | POA: Diagnosis not present

## 2020-02-04 DIAGNOSIS — S199XXA Unspecified injury of neck, initial encounter: Secondary | ICD-10-CM | POA: Diagnosis not present

## 2020-02-04 DIAGNOSIS — L089 Local infection of the skin and subcutaneous tissue, unspecified: Secondary | ICD-10-CM | POA: Diagnosis not present

## 2020-02-04 DIAGNOSIS — S72141A Displaced intertrochanteric fracture of right femur, initial encounter for closed fracture: Secondary | ICD-10-CM | POA: Diagnosis present

## 2020-02-04 DIAGNOSIS — E1169 Type 2 diabetes mellitus with other specified complication: Secondary | ICD-10-CM | POA: Diagnosis not present

## 2020-02-04 DIAGNOSIS — I482 Chronic atrial fibrillation, unspecified: Secondary | ICD-10-CM | POA: Diagnosis not present

## 2020-02-04 DIAGNOSIS — Z89421 Acquired absence of other right toe(s): Secondary | ICD-10-CM | POA: Diagnosis not present

## 2020-02-04 DIAGNOSIS — J8410 Pulmonary fibrosis, unspecified: Secondary | ICD-10-CM | POA: Diagnosis not present

## 2020-02-04 DIAGNOSIS — S51019A Laceration without foreign body of unspecified elbow, initial encounter: Secondary | ICD-10-CM | POA: Diagnosis present

## 2020-02-04 DIAGNOSIS — S72141D Displaced intertrochanteric fracture of right femur, subsequent encounter for closed fracture with routine healing: Secondary | ICD-10-CM | POA: Diagnosis present

## 2020-02-04 DIAGNOSIS — S299XXA Unspecified injury of thorax, initial encounter: Secondary | ICD-10-CM | POA: Diagnosis not present

## 2020-02-04 DIAGNOSIS — E785 Hyperlipidemia, unspecified: Secondary | ICD-10-CM | POA: Diagnosis present

## 2020-02-04 DIAGNOSIS — E1122 Type 2 diabetes mellitus with diabetic chronic kidney disease: Secondary | ICD-10-CM | POA: Diagnosis present

## 2020-02-04 DIAGNOSIS — I50812 Chronic right heart failure: Secondary | ICD-10-CM

## 2020-02-04 DIAGNOSIS — Z950 Presence of cardiac pacemaker: Secondary | ICD-10-CM | POA: Diagnosis not present

## 2020-02-04 DIAGNOSIS — M869 Osteomyelitis, unspecified: Secondary | ICD-10-CM | POA: Diagnosis present

## 2020-02-04 DIAGNOSIS — M25551 Pain in right hip: Secondary | ICD-10-CM | POA: Diagnosis not present

## 2020-02-04 DIAGNOSIS — E1142 Type 2 diabetes mellitus with diabetic polyneuropathy: Secondary | ICD-10-CM | POA: Diagnosis present

## 2020-02-04 DIAGNOSIS — R296 Repeated falls: Secondary | ICD-10-CM | POA: Diagnosis present

## 2020-02-04 DIAGNOSIS — I130 Hypertensive heart and chronic kidney disease with heart failure and stage 1 through stage 4 chronic kidney disease, or unspecified chronic kidney disease: Secondary | ICD-10-CM | POA: Diagnosis present

## 2020-02-04 DIAGNOSIS — Z951 Presence of aortocoronary bypass graft: Secondary | ICD-10-CM | POA: Diagnosis not present

## 2020-02-04 DIAGNOSIS — I4891 Unspecified atrial fibrillation: Secondary | ICD-10-CM | POA: Diagnosis present

## 2020-02-04 DIAGNOSIS — N400 Enlarged prostate without lower urinary tract symptoms: Secondary | ICD-10-CM | POA: Diagnosis present

## 2020-02-04 HISTORY — PX: INTRAMEDULLARY (IM) NAIL INTERTROCHANTERIC: SHX5875

## 2020-02-04 LAB — URINALYSIS, ROUTINE W REFLEX MICROSCOPIC
Bacteria, UA: NONE SEEN
Bilirubin Urine: NEGATIVE
Glucose, UA: NEGATIVE mg/dL
Ketones, ur: NEGATIVE mg/dL
Leukocytes,Ua: NEGATIVE
Nitrite: NEGATIVE
Protein, ur: NEGATIVE mg/dL
Specific Gravity, Urine: 1.008 (ref 1.005–1.030)
pH: 6 (ref 5.0–8.0)

## 2020-02-04 LAB — BASIC METABOLIC PANEL
Anion gap: 8 (ref 5–15)
Anion gap: 13 (ref 5–15)
BUN/Creatinine Ratio: 19 (ref 10–24)
BUN: 23 mg/dL (ref 8–23)
BUN: 23 mg/dL (ref 8–27)
BUN: 24 mg/dL — ABNORMAL HIGH (ref 8–23)
CO2: 28 mmol/L (ref 22–32)
CO2: 27 mmol/L (ref 20–29)
CO2: 27 mmol/L (ref 22–32)
Calcium: 9.3 mg/dL (ref 8.9–10.3)
Calcium: 9.3 mg/dL (ref 8.6–10.2)
Calcium: 9.4 mg/dL (ref 8.9–10.3)
Chloride: 103 mmol/L (ref 98–111)
Chloride: 102 mmol/L (ref 96–106)
Chloride: 101 mmol/L (ref 98–111)
Creatinine, Ser: 1.37 mg/dL — ABNORMAL HIGH (ref 0.61–1.24)
Creatinine, Ser: 1.18 mg/dL (ref 0.76–1.27)
Creatinine, Ser: 1.3 mg/dL — ABNORMAL HIGH (ref 0.61–1.24)
GFR calc Af Amer: 57 mL/min — ABNORMAL LOW (ref >60)
GFR calc Af Amer: 68 mL/min/{1.73_m2} (ref >59)
GFR calc Af Amer: >60 mL/min (ref >60)
GFR calc non Af Amer: 49 mL/min — ABNORMAL LOW (ref >60)
GFR calc non Af Amer: 59 mL/min/{1.73_m2} — ABNORMAL LOW (ref >59)
GFR calc non Af Amer: 53 mL/min — ABNORMAL LOW (ref >60)
Glucose, Bld: 102 mg/dL — ABNORMAL HIGH (ref 70–99)
Glucose, Bld: 104 mg/dL — ABNORMAL HIGH (ref 70–99)
Glucose: 81 mg/dL (ref 65–99)
Potassium: 4.4 mmol/L (ref 3.5–5.1)
Potassium: 4.5 mmol/L (ref 3.5–5.2)
Potassium: 3.9 mmol/L (ref 3.5–5.1)
Sodium: 139 mmol/L (ref 135–145)
Sodium: 142 mmol/L (ref 134–144)
Sodium: 141 mmol/L (ref 135–145)

## 2020-02-04 LAB — SURGICAL PCR SCREEN
MRSA, PCR: NEGATIVE
Staphylococcus aureus: POSITIVE — AB

## 2020-02-04 LAB — CBC
HCT: 37.5 % — ABNORMAL LOW (ref 39.0–52.0)
HCT: 34 % — ABNORMAL LOW (ref 39.0–52.0)
Hemoglobin: 12 g/dL — ABNORMAL LOW (ref 13.0–17.0)
Hemoglobin: 11.2 g/dL — ABNORMAL LOW (ref 13.0–17.0)
MCH: 30.8 pg (ref 26.0–34.0)
MCH: 30.8 pg (ref 26.0–34.0)
MCHC: 32 g/dL (ref 30.0–36.0)
MCHC: 32.9 g/dL (ref 30.0–36.0)
MCV: 96.2 fL (ref 80.0–100.0)
MCV: 93.4 fL (ref 80.0–100.0)
Platelets: 125 10*3/uL — ABNORMAL LOW (ref 150–400)
Platelets: 111 10*3/uL — ABNORMAL LOW (ref 150–400)
RBC: 3.9 MIL/uL — ABNORMAL LOW (ref 4.22–5.81)
RBC: 3.64 MIL/uL — ABNORMAL LOW (ref 4.22–5.81)
RDW: 15 % (ref 11.5–15.5)
RDW: 14.9 % (ref 11.5–15.5)
WBC: 5.3 10*3/uL (ref 4.0–10.5)
WBC: 6.1 10*3/uL (ref 4.0–10.5)
nRBC: 0 % (ref 0.0–0.2)
nRBC: 0 % (ref 0.0–0.2)

## 2020-02-04 LAB — PROTIME-INR
INR: 1.2 (ref 0.8–1.2)
Prothrombin Time: 14.6 seconds (ref 11.4–15.2)

## 2020-02-04 LAB — RESPIRATORY PANEL BY RT PCR (FLU A&B, COVID)
Influenza A by PCR: NEGATIVE
Influenza B by PCR: NEGATIVE
SARS Coronavirus 2 by RT PCR: NEGATIVE

## 2020-02-04 LAB — GLUCOSE, CAPILLARY: Glucose-Capillary: 98 mg/dL (ref 70–99)

## 2020-02-04 LAB — TYPE AND SCREEN
ABO/RH(D): A POS
Antibody Screen: NEGATIVE

## 2020-02-04 SURGERY — FIXATION, FRACTURE, INTERTROCHANTERIC, WITH INTRAMEDULLARY ROD
Anesthesia: General | Laterality: Right

## 2020-02-04 MED ORDER — POLYETHYLENE GLYCOL 3350 17 G PO PACK
17.0000 g | PACK | Freq: Every day | ORAL | Status: DC | PRN
  Filled 2020-02-04: qty 1

## 2020-02-04 MED ORDER — ACETAMINOPHEN 10 MG/ML IV SOLN: 1000.0000 mg | Freq: Once | INTRAVENOUS | Status: DC | PRN

## 2020-02-04 MED ORDER — FENTANYL CITRATE (PF) 100 MCG/2ML IJ SOLN
INTRAMUSCULAR | Status: DC | PRN
  Administered 2020-02-04 (×2): 25 ug via INTRAVENOUS
  Administered 2020-02-04: 50 ug via INTRAVENOUS

## 2020-02-04 MED ORDER — PANTOPRAZOLE SODIUM 40 MG PO TBEC
40.0000 mg | DELAYED_RELEASE_TABLET | Freq: Every day | ORAL | Status: DC
  Administered 2020-02-05 – 2020-02-09 (×5): 40 mg via ORAL
  Filled 2020-02-04 (×5): qty 1

## 2020-02-04 MED ORDER — HYDROCODONE-ACETAMINOPHEN 5-325 MG PO TABS: 1.0000 | ORAL_TABLET | Freq: Four times a day (QID) | ORAL | 0 refills | Status: DC | PRN

## 2020-02-04 MED ORDER — SENNOSIDES-DOCUSATE SODIUM 8.6-50 MG PO TABS
1.0000 | ORAL_TABLET | Freq: Every evening | ORAL | Status: DC | PRN
  Administered 2020-02-06: 1 via ORAL

## 2020-02-04 MED ORDER — TAMSULOSIN HCL 0.4 MG PO CAPS
0.4000 mg | ORAL_CAPSULE | Freq: Every day | ORAL | Status: DC
  Administered 2020-02-04 – 2020-02-05 (×2): 0.4 mg via ORAL
  Filled 2020-02-04 (×2): qty 1

## 2020-02-04 MED ORDER — METHOCARBAMOL 500 MG PO TABS
500.0000 mg | ORAL_TABLET | Freq: Four times a day (QID) | ORAL | Status: DC | PRN
  Administered 2020-02-07 – 2020-02-08 (×2): 500 mg via ORAL
  Filled 2020-02-04 (×2): qty 1

## 2020-02-04 MED ORDER — ENOXAPARIN SODIUM 40 MG/0.4ML ~~LOC~~ SOLN
40.0000 mg | SUBCUTANEOUS | Status: DC
  Filled 2020-02-04 (×2): qty 0.4

## 2020-02-04 MED ORDER — METHOCARBAMOL 500 MG PO TABS: 500.0000 mg | ORAL_TABLET | Freq: Three times a day (TID) | ORAL | 1 refills | Status: DC | PRN

## 2020-02-04 MED ORDER — CEFAZOLIN SODIUM-DEXTROSE 2-4 GM/100ML-% IV SOLN
2.0000 g | Freq: Four times a day (QID) | INTRAVENOUS | Status: AC
  Administered 2020-02-04 – 2020-02-05 (×2): 2 g via INTRAVENOUS
  Filled 2020-02-04 (×2): qty 100

## 2020-02-04 MED ORDER — FERROUS SULFATE 325 (65 FE) MG PO TABS
325.0000 mg | ORAL_TABLET | Freq: Three times a day (TID) | ORAL | Status: DC
  Administered 2020-02-04 – 2020-02-05 (×3): 325 mg via ORAL
  Filled 2020-02-04 (×3): qty 1

## 2020-02-04 MED ORDER — FENTANYL CITRATE (PF) 250 MCG/5ML IJ SOLN
INTRAMUSCULAR | Status: AC
  Filled 2020-02-04: qty 5

## 2020-02-04 MED ORDER — SUGAMMADEX SODIUM 200 MG/2ML IV SOLN
INTRAVENOUS | Status: DC | PRN
  Administered 2020-02-04: 200 mg via INTRAVENOUS

## 2020-02-04 MED ORDER — METHOCARBAMOL 1000 MG/10ML IJ SOLN
500.0000 mg | Freq: Four times a day (QID) | INTRAVENOUS | Status: DC | PRN
  Filled 2020-02-04: qty 5

## 2020-02-04 MED ORDER — ROCURONIUM BROMIDE 10 MG/ML (PF) SYRINGE
PREFILLED_SYRINGE | INTRAVENOUS | Status: AC
  Filled 2020-02-04: qty 10

## 2020-02-04 MED ORDER — CHLORHEXIDINE GLUCONATE 0.12 % MT SOLN
15.0000 mL | Freq: Two times a day (BID) | OROMUCOSAL | Status: DC
  Administered 2020-02-04 – 2020-02-09 (×9): 15 mL via OROMUCOSAL
  Filled 2020-02-04 (×10): qty 15

## 2020-02-04 MED ORDER — OXYCODONE HCL 5 MG PO TABS
ORAL_TABLET | ORAL | Status: AC
  Filled 2020-02-04: qty 1

## 2020-02-04 MED ORDER — 0.9 % SODIUM CHLORIDE (POUR BTL) OPTIME
TOPICAL | Status: DC | PRN
  Administered 2020-02-04: 17:00:00 1000 mL

## 2020-02-04 MED ORDER — DEXAMETHASONE SODIUM PHOSPHATE 10 MG/ML IJ SOLN
INTRAMUSCULAR | Status: DC | PRN
  Administered 2020-02-04: 5 mg via INTRAVENOUS

## 2020-02-04 MED ORDER — ONDANSETRON HCL 4 MG/2ML IJ SOLN
INTRAMUSCULAR | Status: DC | PRN
  Administered 2020-02-04: 4 mg via INTRAVENOUS

## 2020-02-04 MED ORDER — HYDROCODONE-ACETAMINOPHEN 5-325 MG PO TABS
1.0000 | ORAL_TABLET | ORAL | Status: DC | PRN
  Administered 2020-02-05 – 2020-02-07 (×5): 1 via ORAL
  Administered 2020-02-08: 2 via ORAL
  Administered 2020-02-08: 1 via ORAL
  Administered 2020-02-08 – 2020-02-09 (×2): 2 via ORAL
  Filled 2020-02-04: qty 2
  Filled 2020-02-04 (×3): qty 1
  Filled 2020-02-04: qty 2
  Filled 2020-02-04 (×2): qty 1
  Filled 2020-02-04: qty 2
  Filled 2020-02-04 (×2): qty 1

## 2020-02-04 MED ORDER — OXYCODONE HCL 5 MG/5ML PO SOLN: 5.0000 mg | Freq: Once | ORAL | Status: AC | PRN

## 2020-02-04 MED ORDER — ONDANSETRON HCL 4 MG PO TABS: 4.0000 mg | ORAL_TABLET | Freq: Four times a day (QID) | ORAL | Status: DC | PRN

## 2020-02-04 MED ORDER — ROCURONIUM BROMIDE 10 MG/ML (PF) SYRINGE
PREFILLED_SYRINGE | INTRAVENOUS | Status: DC | PRN
  Administered 2020-02-04: 60 mg via INTRAVENOUS

## 2020-02-04 MED ORDER — ADULT MULTIVITAMIN W/MINERALS CH
1.0000 | ORAL_TABLET | Freq: Every day | ORAL | Status: DC
  Administered 2020-02-05 – 2020-02-09 (×5): 1 via ORAL
  Filled 2020-02-04 (×5): qty 1

## 2020-02-04 MED ORDER — JUVEN PO PACK
1.0000 | PACK | Freq: Two times a day (BID) | ORAL | Status: DC
  Administered 2020-02-05 – 2020-02-09 (×7): 1 via ORAL
  Filled 2020-02-04 (×8): qty 1

## 2020-02-04 MED ORDER — MIRABEGRON ER 50 MG PO TB24
50.0000 mg | ORAL_TABLET | Freq: Every day | ORAL | Status: DC
  Administered 2020-02-05 – 2020-02-09 (×5): 50 mg via ORAL
  Filled 2020-02-04 (×6): qty 1

## 2020-02-04 MED ORDER — PROPOFOL 10 MG/ML IV BOLUS
INTRAVENOUS | Status: AC
  Filled 2020-02-04: qty 20

## 2020-02-04 MED ORDER — PROPOFOL 10 MG/ML IV BOLUS
INTRAVENOUS | Status: DC | PRN
  Administered 2020-02-04: 120 mg via INTRAVENOUS

## 2020-02-04 MED ORDER — DEXAMETHASONE SODIUM PHOSPHATE 10 MG/ML IJ SOLN
INTRAMUSCULAR | Status: AC
  Filled 2020-02-04: qty 1

## 2020-02-04 MED ORDER — METOCLOPRAMIDE HCL 5 MG/ML IJ SOLN: 5.0000 mg | Freq: Three times a day (TID) | INTRAMUSCULAR | Status: DC | PRN

## 2020-02-04 MED ORDER — MENTHOL 3 MG MT LOZG: 1.0000 | LOZENGE | OROMUCOSAL | Status: DC | PRN

## 2020-02-04 MED ORDER — ACETAMINOPHEN 325 MG PO TABS: 325.0000 mg | ORAL_TABLET | Freq: Four times a day (QID) | ORAL | Status: DC | PRN

## 2020-02-04 MED ORDER — CHLORHEXIDINE GLUCONATE 4 % EX LIQD
60.0000 mL | Freq: Once | CUTANEOUS | Status: AC
  Administered 2020-02-04: 4 via TOPICAL

## 2020-02-04 MED ORDER — METOCLOPRAMIDE HCL 5 MG PO TABS: 5.0000 mg | ORAL_TABLET | Freq: Three times a day (TID) | ORAL | Status: DC | PRN

## 2020-02-04 MED ORDER — FENTANYL CITRATE (PF) 100 MCG/2ML IJ SOLN: 25.0000 ug | INTRAMUSCULAR | Status: DC | PRN

## 2020-02-04 MED ORDER — ONDANSETRON HCL 4 MG/2ML IJ SOLN: 4.0000 mg | Freq: Four times a day (QID) | INTRAMUSCULAR | Status: DC | PRN

## 2020-02-04 MED ORDER — PROMETHAZINE HCL 25 MG/ML IJ SOLN: 6.2500 mg | INTRAMUSCULAR | Status: DC | PRN

## 2020-02-04 MED ORDER — DOCUSATE SODIUM 100 MG PO CAPS
100.0000 mg | ORAL_CAPSULE | Freq: Two times a day (BID) | ORAL | Status: DC
  Administered 2020-02-04 – 2020-02-09 (×8): 100 mg via ORAL
  Filled 2020-02-04 (×10): qty 1

## 2020-02-04 MED ORDER — ORAL CARE MOUTH RINSE
15.0000 mL | Freq: Two times a day (BID) | OROMUCOSAL | Status: DC
  Administered 2020-02-05 – 2020-02-09 (×5): 15 mL via OROMUCOSAL

## 2020-02-04 MED ORDER — SERTRALINE HCL 100 MG PO TABS
200.0000 mg | ORAL_TABLET | Freq: Every day | ORAL | Status: DC
  Administered 2020-02-05 – 2020-02-09 (×5): 200 mg via ORAL
  Filled 2020-02-04 (×6): qty 2

## 2020-02-04 MED ORDER — LIDOCAINE 2% (20 MG/ML) 5 ML SYRINGE
INTRAMUSCULAR | Status: AC
  Filled 2020-02-04: qty 5

## 2020-02-04 MED ORDER — MORPHINE SULFATE (PF) 2 MG/ML IV SOLN
1.0000 mg | INTRAVENOUS | Status: DC | PRN
  Administered 2020-02-04 – 2020-02-05 (×2): 1 mg via INTRAVENOUS
  Filled 2020-02-04 (×2): qty 1

## 2020-02-04 MED ORDER — PHENOL 1.4 % MT LIQD: 1.0000 | OROMUCOSAL | Status: DC | PRN

## 2020-02-04 MED ORDER — FENTANYL CITRATE (PF) 100 MCG/2ML IJ SOLN
50.0000 ug | Freq: Once | INTRAMUSCULAR | Status: AC
  Administered 2020-02-04: 50 ug via INTRAVENOUS
  Filled 2020-02-04: qty 2

## 2020-02-04 MED ORDER — BISACODYL 10 MG RE SUPP: 10.0000 mg | Freq: Every day | RECTAL | Status: DC | PRN

## 2020-02-04 MED ORDER — CEFAZOLIN SODIUM-DEXTROSE 2-4 GM/100ML-% IV SOLN
2.0000 g | INTRAVENOUS | Status: AC
  Administered 2020-02-04: 2 g via INTRAVENOUS
  Filled 2020-02-04: qty 100

## 2020-02-04 MED ORDER — MUPIROCIN 2 % EX OINT
1.0000 "application " | TOPICAL_OINTMENT | Freq: Two times a day (BID) | CUTANEOUS | Status: AC
  Administered 2020-02-04 – 2020-02-08 (×10): 1 via NASAL
  Filled 2020-02-04 (×3): qty 22

## 2020-02-04 MED ORDER — CHLORHEXIDINE GLUCONATE CLOTH 2 % EX PADS
6.0000 | MEDICATED_PAD | Freq: Every day | CUTANEOUS | Status: AC
  Administered 2020-02-04 – 2020-02-08 (×5): 6 via TOPICAL

## 2020-02-04 MED ORDER — ARTIFICIAL TEARS OPHTHALMIC OINT
TOPICAL_OINTMENT | OPHTHALMIC | Status: AC
  Filled 2020-02-04: qty 3.5

## 2020-02-04 MED ORDER — ONDANSETRON HCL 4 MG/2ML IJ SOLN
INTRAMUSCULAR | Status: AC
  Filled 2020-02-04: qty 2

## 2020-02-04 MED ORDER — SODIUM CHLORIDE 0.9 % IV SOLN: INTRAVENOUS | Status: DC

## 2020-02-04 MED ORDER — PHENYLEPHRINE HCL-NACL 10-0.9 MG/250ML-% IV SOLN
INTRAVENOUS | Status: DC | PRN
  Administered 2020-02-04: 30 ug/min via INTRAVENOUS

## 2020-02-04 MED ORDER — ROSUVASTATIN CALCIUM 20 MG PO TABS
40.0000 mg | ORAL_TABLET | Freq: Every evening | ORAL | Status: DC
  Administered 2020-02-05 – 2020-02-08 (×4): 40 mg via ORAL
  Filled 2020-02-04 (×4): qty 2

## 2020-02-04 MED ORDER — ENSURE ENLIVE PO LIQD
237.0000 mL | Freq: Every day | ORAL | Status: DC
  Administered 2020-02-05 – 2020-02-08 (×4): 237 mL via ORAL

## 2020-02-04 MED ORDER — LACTATED RINGERS IV SOLN: INTRAVENOUS | Status: DC

## 2020-02-04 MED ORDER — LIDOCAINE 2% (20 MG/ML) 5 ML SYRINGE
INTRAMUSCULAR | Status: DC | PRN
  Administered 2020-02-04: 100 mg via INTRAVENOUS

## 2020-02-04 MED ORDER — OXYCODONE HCL 5 MG PO TABS
5.0000 mg | ORAL_TABLET | Freq: Once | ORAL | Status: AC | PRN
  Administered 2020-02-04: 5 mg via ORAL

## 2020-02-04 MED ORDER — METOPROLOL TARTRATE 25 MG PO TABS
25.0000 mg | ORAL_TABLET | Freq: Two times a day (BID) | ORAL | Status: DC
  Administered 2020-02-04 (×2): 25 mg via ORAL
  Filled 2020-02-04 (×3): qty 1

## 2020-02-04 SURGICAL SUPPLY — 42 items
BIT DRILL CANN LG 4.3MM (BIT) IMPLANT
BLADE SURG 15 STRL LF DISP TIS (BLADE) ×1 IMPLANT
BLADE SURG 15 STRL SS (BLADE)
BNDG CONFORM 3 STRL LF (GAUZE/BANDAGES/DRESSINGS) ×2 IMPLANT
COVER PERINEAL POST (MISCELLANEOUS) ×2 IMPLANT
COVER WAND RF STERILE (DRAPES) ×1 IMPLANT
DRAPE STERI IOBAN 125X83 (DRAPES) ×2 IMPLANT
DRILL BIT CANN LG 4.3MM (BIT) ×2
DRSG ADAPTIC 3X8 NADH LF (GAUZE/BANDAGES/DRESSINGS) ×1 IMPLANT
DRSG MEPILEX BORDER 4X4 (GAUZE/BANDAGES/DRESSINGS) ×3 IMPLANT
DRSG MEPILEX BORDER 4X8 (GAUZE/BANDAGES/DRESSINGS) ×2 IMPLANT
ELECT REM PT RETURN 9FT ADLT (ELECTROSURGICAL) ×2
ELECTRODE REM PT RTRN 9FT ADLT (ELECTROSURGICAL) ×1 IMPLANT
GLOVE BIOGEL PI ORTHO PRO 7.5 (GLOVE) ×1
GLOVE BIOGEL PI ORTHO PRO SZ8 (GLOVE) ×1
GLOVE ORTHO TXT STRL SZ7.5 (GLOVE) ×2 IMPLANT
GLOVE PI ORTHO PRO STRL 7.5 (GLOVE) ×1 IMPLANT
GLOVE PI ORTHO PRO STRL SZ8 (GLOVE) ×1 IMPLANT
GLOVE SURG ORTHO 8.5 STRL (GLOVE) ×2 IMPLANT
GOWN STRL REUS W/ TWL LRG LVL3 (GOWN DISPOSABLE) ×1 IMPLANT
GOWN STRL REUS W/ TWL XL LVL3 (GOWN DISPOSABLE) ×2 IMPLANT
GOWN STRL REUS W/TWL LRG LVL3 (GOWN DISPOSABLE) ×2
GOWN STRL REUS W/TWL XL LVL3 (GOWN DISPOSABLE) ×4
GUIDEPIN 3.2X17.5 THRD DISP (PIN) ×2 IMPLANT
HFN LAG SCREW 10.5MM X 115MM (Orthopedic Implant) ×1 IMPLANT
KIT BASIN OR (CUSTOM PROCEDURE TRAY) ×2 IMPLANT
KIT TURNOVER KIT B (KITS) ×2 IMPLANT
MANIFOLD NEPTUNE II (INSTRUMENTS) ×1 IMPLANT
NAIL HIP FRACT 130D 11X180 (Screw) ×1 IMPLANT
NS IRRIG 1000ML POUR BTL (IV SOLUTION) ×2 IMPLANT
PACK GENERAL/GYN (CUSTOM PROCEDURE TRAY) ×2 IMPLANT
PAD ARMBOARD 7.5X6 YLW CONV (MISCELLANEOUS) ×3 IMPLANT
SCREW BONE CORTICAL 5.0X40 (Screw) ×1 IMPLANT
SPONGE LAP 18X18 RF (DISPOSABLE) ×2 IMPLANT
STAPLER VISISTAT 35W (STAPLE) ×3 IMPLANT
STRIP CLOSURE SKIN 1/2X4 (GAUZE/BANDAGES/DRESSINGS) ×1 IMPLANT
SUT VIC AB 0 CT1 27 (SUTURE) ×1
SUT VIC AB 0 CT1 27XBRD ANBCTR (SUTURE) ×1 IMPLANT
SUT VIC AB 2-0 CT1 27 (SUTURE) ×1
SUT VIC AB 2-0 CT1 TAPERPNT 27 (SUTURE) ×1 IMPLANT
TRAY FOLEY MTR SLVR 16FR STAT (SET/KITS/TRAYS/PACK) IMPLANT
WATER STERILE IRR 1000ML POUR (IV SOLUTION) ×1 IMPLANT

## 2020-02-04 NOTE — Progress Notes (Signed)
Pt's wedding ring and watch placed in pink denture cup with pt sticker on it. Place in pt's belongings bag.

## 2020-02-04 NOTE — Transfer of Care (Signed)
Immediate Anesthesia Transfer of Care Note  Patient: [name].  Procedure(s) Performed: INTRAMEDULLARY (IM) NAIL INTERTROCHANTRIC (Right )  Patient Location: PACU  Anesthesia Type:General  Level of Consciousness: drowsy, patient cooperative and responds to stimulation  Airway & Oxygen Therapy: Patient Spontanous Breathing  Post-op Assessment: Report given to RN and Post -op Vital signs reviewed and stable  Post vital signs: Reviewed and stable  Last Vitals:  Vitals Value Taken Time  BP 110/60 02/04/20 1941  Temp 36.4 C 02/04/20 1941  Pulse 108 02/04/20 1941  Resp 18 02/04/20 1941  SpO2 97 % 02/04/20 1941    Last Pain:  Vitals:   02/04/20 1941  TempSrc: Oral  PainSc:          Complications: No apparent anesthesia complications

## 2020-02-04 NOTE — Anesthesia Preprocedure Evaluation (Addendum)
Anesthesia Evaluation  Patient identified by MRN, date of birth, ID band Patient awake    Reviewed: Allergy & Precautions, NPO status , Patient's Chart, lab work & pertinent test results  Airway Mallampati: II  TM Distance: >3 FB Neck ROM: Limited   Comment: Patient in C collar from previous fall. Can extend and flex neck without collar on.  CT done today show no cervical fractures or subluxation of the cervical spine Dental no notable dental hx.    Pulmonary sleep apnea , former smoker,  Pulmonary HTN   Pulmonary exam normal breath sounds clear to auscultation       Cardiovascular hypertension, + CAD, + CABG and + DOE  + dysrhythmias Atrial Fibrillation + pacemaker + Valvular Problems/Murmurs MR  Rhythm:Regular Rate:Tachycardia     Neuro/Psych negative neurological ROS  negative psych ROS   GI/Hepatic negative GI ROS, Neg liver ROS,   Endo/Other  diabetes  Renal/GU negative Renal ROS  negative genitourinary   Musculoskeletal negative musculoskeletal ROS (+)   Abdominal   Peds negative pediatric ROS (+)  Hematology  (+) anemia ,   Anesthesia Other Findings   Reproductive/Obstetrics negative OB ROS                            Anesthesia Physical Anesthesia Plan  ASA: III  Anesthesia Plan: General   Post-op Pain Management:    Induction: Intravenous  PONV Risk Score and Plan: 2 and Ondansetron, Dexamethasone and Treatment may vary due to age or medical condition  Airway Management Planned: Oral ETT and Video Laryngoscope Planned  Additional Equipment:   Intra-op Plan:   Post-operative Plan: Extubation in OR  Informed Consent: I have reviewed the patients History and Physical, chart, labs and discussed the procedure including the risks, benefits and alternatives for the proposed anesthesia with the patient or authorized representative who has indicated his/her understanding and  acceptance.     Dental advisory given  Plan Discussed with: CRNA and Surgeon  Anesthesia Plan Comments:        Anesthesia Quick Evaluation

## 2020-02-04 NOTE — H&P (Signed)
History and Physical    Jamie G Figge Jr. [id number] DOB: 06/22/1941 DOA: 02/03/2020  PCP: [name], MD   Patient coming from: Home   Chief Complaint: Fall with right hip pain   HPI: Jamie G Corkey Sisk. is a 79 y.o. male with medical history significant for chronic diastolic and right-sided heart failure, atrial fibrillation status post Maze procedure no longer anticoagulated after spontaneous SDH, CAD status post CABG, symptomatic bradycardia now with pacer, chronic kidney disease stage IIIa, OSA on CPAP, chronic anemia and thrombocytopenia, pulmonary fibrosis, and frequent falls, now presenting to emergency department for evaluation of right hip pain after fall.  Patient reports that he had been in his usual state of health this evening when he lost balance and fell at home, initially striking a wall before falling to the ground.  He suffered right elbow injury and was having severe pain at the right hip following this.  He denies any loss of consciousness.  He has not been on anticoagulation due to history of subdural hematoma.  He has been wearing a cervical collar since suffering a cervical spine fracture in November.  He denies any recent chest pain, palpitations, leg swelling, or worsening in his chronic dyspnea.  He denies fevers or chills.  ED Course: Upon arrival to the ED, patient is found to be afebrile, saturating low 90s on room air, heart rate in the low [id number], and systolic blood pressure in the low 100s.  EKG features atrial fibrillation with rate 103, RBBB, and borderline ST depression laterally.  Radiographs of the right hip are concerning for intertrochanteric fracture, chest x-ray is negative for acute findings, and right elbow radiographs are negative.  Noncontrast head CT is negative for acute intracranial abnormality and no acute fracture or subluxation is noted on cervical spine CT.  Chemistry panel features a creatinine 1.37, similar to priors.  CBC notable for mild  normocytic anemia and platelets 125,000.  Patient was treated with fentanyl and Zofran in the emergency department, COVID-19 screening test is in process, and orthopedic surgery was consulted by the ED physician, recommending a medical admission.  Review of Systems:  All other systems reviewed and apart from HPI, are negative.  Past Medical History:  Diagnosis Date  . (HFpEF) heart failure with preserved ejection fraction ([place])    a. 05/2013 Echo: EF 55%, mild LVH, diast dysfxn, Ao sclerosis, mildly dil LA, RV dysfxn (poorly visualized), PASP 47mmHg;  b. 03/2017 Echo: EF 55-60%, no rwma, triv MR, mildly dil RV, mod TR, PASP 24mmHg.  . Atrial fibrillation [facility])    s/p Cox Maze 1/09;  Multaq Rx d/c'd in 2014 due to pulmo fibrosis;  coumadin d/c'd in 2014 due to spontaneous subdural hematoma  . BPH (benign prostatic hyperplasia)   . CAD (coronary artery disease), native coronary artery    a. s/p CABG 12/2007;  b. Myoview 12/2011: EF 66%, no scar or ischemia; normal.  . DM (diabetes mellitus) ([place])   . Hyperlipidemia type II   . Hypertension   . 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 OSA (obstructive sleep apnea)   . Pacemaker    PPM - St. Jude  . Peripheral neuropathy 07/31/2018  . Pulmonary fibrosis ([place])    Multaq d/c'd 7/14  . Subdural hematoma ([place]) 07/2012   spontaneous;  coumadin d/c'd => no longer a candidate for anticoagulation    Past Surgical History:  Procedure Laterality Date  . AMPUTATION Left 05/17/2019   Procedure: AMPUTATION  LEFT FOURTH TOE;  Surgeon: Meredith Pel, MD;  Location: [place];  Service: Orthopedics;  Laterality: Left;  . APPENDECTOMY    . CHOLECYSTECTOMY    . CORONARY ARTERY BYPASS GRAFT     x3 (left internal mammary artery to distal left anterior descending coronary artery, saphenous vain graft to second circumflex marginal branch, saphenous vain graft to posterior descending coronary artery, endoscopic saphenous vain  harvest from right thigh) and modified Cox - Maze IV procedure.  [name],MD. Electronically signed CHO/MEDQ D: 01/09/2008/ JOB: [id number] cc:  [name]  . [name]  07/30/2012   Procedure: CRANIOTOMY HEMATOMA EVACUATION SUBDURAL;  Surgeon: [name], MD;  Location: [place] [facility];  Service: Neurosurgery;  Laterality: Right;  Right craniotomy for evacuation of subdural hematoma  . FOOT SURGERY    . HERNIA REPAIR    . ORCHIECTOMY     Left  /  testicular cancer  . PACEMAKER PLACEMENT     PPM - St. Jude  . PPM GENERATOR CHANGEOUT N/A 06/25/2019   Procedure: PPM GENERATOR CHANGEOUT;  Surgeon: Evans Lance, MD;  Location: [facility];  Service: Cardiovascular;  Laterality: N/A;     reports that he quit smoking about 28 years ago. His smoking use included cigarettes. He has a 40.00 pack-year smoking history. He has never used smokeless tobacco. He reports that he does not drink alcohol or use drugs.  Allergies  Allergen Reactions  . Lipitor [Atorvastatin] Other (See Comments)    Stiff joints  . 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 Warfarin And Related Other (See Comments)    Stiff joints  . Enbrel [Etanercept] Itching    Family History  Problem Relation Age of Onset  . Heart disease Father   . Heart attack Father   . Heart failure Father   . Heart disease Mother   . Alzheimer's disease Mother   . Dementia Mother      Prior to Admission medications   Medication Sig Start Date End Date Taking? Authorizing Provider  allopurinol (ZYLOPRIM) 300 MG tablet Take 300 mg by mouth daily.  06/21/16  Yes [provider]  furosemide (LASIX) 40 MG tablet Take 60 mg by mouth 2 (two) times daily.    Yes [provider]  metoprolol tartrate (LOPRESSOR) 25 MG tablet Take 1 tablet (25 mg total) by mouth 2 (two) times daily. 01/07/20  Yes Lendon Colonel, NP  mirabegron ER (MYRBETRIQ) 50 MG TB24 tablet Take 1 tablet (50 mg total) by  mouth daily. 10/06/19  Yes Kathrynn Ducking, MD  pantoprazole (PROTONIX) 40 MG tablet Take 40 mg by mouth daily. 02/22/17  Yes [provider]  potassium chloride SA (K-DUR) 20 MEQ tablet Take 1 tablet (20 mEq total) by mouth daily. 06/04/19  Yes Love, Ivan Anchors, PA-C  rosuvastatin (CRESTOR) 40 MG tablet Take 1 tablet (40 mg total) by mouth daily. Patient taking differently: Take 40 mg by mouth every evening.  12/25/18  Yes [name], MD  sertraline (ZOLOFT) 100 MG tablet Take 200 mg by mouth daily.    Yes [provider]  torsemide (DEMADEX) 20 MG tablet Take 1 tablet (20 mg total) by mouth 2 (two) times daily. 12/10/19  Yes [name], MD  acetaminophen (TYLENOL) 325 MG tablet Take 1-2 tablets (325-650 mg total) by mouth every 4 (four) hours as needed for mild pain. 05/28/19   Reesa Chew  S, PA-C  tamsulosin (FLOMAX) 0.4 MG CAPS capsule Take 0.4 mg by mouth at bedtime.     [provider]    Physical Exam: Vitals:   02/04/20 0100 02/04/20 0115 02/04/20 0145 02/04/20 0215  BP: 113/79 104/86 110/77 107/68  Pulse: (!) 106 (!) 107 (!) 111 (!) 113  Resp: 17 19 (!) 23 12  Temp:      TempSrc:      SpO2: 92% 96% 92% 99%     Constitutional: NAD, calm  Eyes: PERTLA, lids and conjunctivae normal ENMT: Mucous membranes are moist. Posterior pharynx clear of any exudate or lesions.   Neck: supple, in cervical collar  Respiratory:  no wheezing, no crackles. No accessory muscle use.  Cardiovascular: rate ~100 and irregularly irregular. No extremity edema.  Abdomen: No distension, no tenderness, soft. Bowel sounds active.  Musculoskeletal: no clubbing / cyanosis. Right hip tenderness, neurovascularly intact distally.   Skin: no significant rashes, lesions, ulcers. Warm, dry, well-perfused. Neurologic: No facial asymmetry, no dysarthria. Sensation intact. Moving all extremities.  Psychiatric: Sleeping, wakes to voice and appropriate throughout interview and exam.  Pleasant and cooperative.    Labs and Imaging on Admission: I have personally reviewed following labs and imaging studies  CBC: Recent Labs  Lab 02/04/20 0040  WBC 5.3  HGB 12.0*  HCT 37.5*  MCV 96.2  PLT 0000000*   Basic Metabolic Panel: Recent Labs  Lab 02/03/20 1211 02/04/20 0040  NA 142 139  K 4.5 4.4  CL 102 103  CO2 27 28  GLUCOSE 81 102*  BUN 23 23  CREATININE 1.18 1.37*  CALCIUM 9.3 9.3   GFR: Estimated Creatinine Clearance: 47.5 mL/min (A) (by C-G formula based on SCr of 1.37 mg/dL (H)). Liver Function Tests: No results for input(s): AST, ALT, ALKPHOS, BILITOT, PROT, ALBUMIN in the last 168 hours. No results for input(s): LIPASE, AMYLASE in the last 168 hours. No results for input(s): AMMONIA in the last 168 hours. Coagulation Profile: Recent Labs  Lab 02/04/20 0040  INR 1.2   Cardiac Enzymes: No results for input(s): CKTOTAL, CKMB, CKMBINDEX, TROPONINI in the last 168 hours. BNP (last 3 results) No results for input(s): PROBNP in the last 8760 hours. HbA1C: No results for input(s): HGBA1C in the last 72 hours. CBG: No results for input(s): GLUCAP in the last 168 hours. Lipid Profile: No results for input(s): CHOL, HDL, LDLCALC, TRIG, CHOLHDL, LDLDIRECT in the last 72 hours. Thyroid Function Tests: No results for input(s): TSH, T4TOTAL, FREET4, T3FREE, THYROIDAB in the last 72 hours. Anemia Panel: No results for input(s): VITAMINB12, FOLATE, FERRITIN, TIBC, IRON, RETICCTPCT in the last 72 hours. Urine analysis:    Component Value Date/Time   COLORURINE STRAW (A) 02/04/2020 0212   APPEARANCEUR CLEAR 02/04/2020 0212   LABSPEC 1.008 02/04/2020 0212   PHURINE 6.0 02/04/2020 0212   GLUCOSEU NEGATIVE 02/04/2020 0212   HGBUR SMALL (A) 02/04/2020 0212   BILIRUBINUR NEGATIVE 02/04/2020 0212   KETONESUR NEGATIVE 02/04/2020 0212   PROTEINUR NEGATIVE 02/04/2020 0212   UROBILINOGEN 0.2 01/07/2008 1137   NITRITE NEGATIVE 02/04/2020 0212   LEUKOCYTESUR  NEGATIVE 02/04/2020 0212   Sepsis Labs: @LABRCNTIP (procalcitonin:4,lacticidven:4) ) Recent Results (from the past 240 hour(s))  SARS CORONAVIRUS 2 (TAT 6-24 HRS) Nasopharyngeal Nasopharyngeal Swab     Status: None   Collection Time: 01/26/20  1:23 PM   Specimen: Nasopharyngeal Swab  Result Value Ref Range Status   SARS Coronavirus 2 NEGATIVE NEGATIVE Final    Comment: (NOTE) SARS-CoV-2 target nucleic  acids are NOT DETECTED. The SARS-CoV-2 RNA is generally detectable in upper and lower respiratory specimens during the acute phase of infection. Negative results do not preclude SARS-CoV-2 infection, do not rule out co-infections with other pathogens, and should not be used as the sole basis for treatment or other patient management decisions. Negative results must be combined with clinical observations, patient history, and epidemiological information. The expected result is Negative. Fact Sheet for Patients: SugarRoll.be Fact Sheet for Healthcare Providers: https://www.woods-mathews.com/ This test is not yet approved or cleared by the [place] FDA and  has been authorized for detection and/or diagnosis of SARS-CoV-2 by FDA under an Emergency Use Authorization (EUA). This EUA will remain  in effect (meaning this test can be used) for the duration of the COVID-19 declaration under Section 56 4(b)(1) of the Act, 21 U.S.C. section 360bbb-3(b)(1), unless the authorization is terminated or revoked sooner. Performed at [facility], [place] [address], [place], [place] [address]      Radiological Exams on Admission: DG Chest 1 View  Result Date: 02/04/2020 CLINICAL DATA:  Fall EXAM: CHEST  1 VIEW COMPARISON:  05/15/2019 FINDINGS: The heart size and mediastinal contours are within normal limits. Both lungs are clear. The visualized skeletal structures are unremarkable. Median sternotomy and left chest wall pacemaker. IMPRESSION: No active  disease. Electronically Signed   By: Ulyses Jarred M.D.   On: 02/04/2020 00:37   DG Elbow 2 Views Right  Result Date: 02/04/2020 CLINICAL DATA:  Right elbow and hip pain after fall EXAM: RIGHT ELBOW - 2 VIEW COMPARISON:  None. FINDINGS: There is no evidence of fracture, dislocation, or joint effusion. There is no evidence of arthropathy or other focal bone abnormality. Soft tissues are unremarkable. IMPRESSION: Negative. Electronically Signed   By: Ulyses Jarred M.D.   On: 02/04/2020 00:29   CT Head Wo Contrast  Result Date: 02/04/2020 CLINICAL DATA:  Fall EXAM: CT HEAD WITHOUT CONTRAST CT CERVICAL SPINE WITHOUT CONTRAST TECHNIQUE: Multidetector CT imaging of the head and cervical spine was performed following the standard protocol without intravenous contrast. Multiplanar CT image reconstructions of the cervical spine were also generated. COMPARISON:  None. FINDINGS: CT HEAD FINDINGS Brain: There is no mass, hemorrhage or extra-axial collection. The size and configuration of the ventricles and extra-axial CSF spaces are normal. Areas of hypoattenuation of the deep gray nuclei and confluent periventricular white matter hypodensity, consistent with chronic small vessel disease. 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 Vascular: No abnormal hyperdensity of the major intracranial arteries or dural venous sinuses. No intracranial atherosclerosis. Unchanged calcification along the course of the left M2 segment. Skull: Remote right-sided craniotomy. Sinuses/Orbits: No fluid levels or advanced mucosal thickening of the visualized paranasal sinuses. No mastoid or middle ear effusion. The orbits are normal. CT CERVICAL SPINE FINDINGS Alignment: No static subluxation. Facets are aligned. Occipital condyles are normally positioned. Skull base and vertebrae: No acute fracture. Soft tissues and spinal canal: No prevertebral fluid or swelling. No visible canal hematoma. Disc levels: No bony spinal canal  stenosis. Multilevel mild vertebral body height loss, particularly at C3 and C4. Facet hypertrophy is worst at right C3-4 and left C4-5. Upper chest: No pneumothorax, pulmonary nodule or pleural effusion. Other: Normal visualized paraspinal cervical soft tissues. IMPRESSION: 1. Chronic ischemic microangiopathy without acute intracranial abnormality. 2. No acute fracture or static subluxation of the cervical spine. Electronically Signed   By: Ulyses Jarred M.D.   On: 02/04/2020 00:54   CT Cervical Spine Wo  Contrast  Result Date: 02/04/2020 CLINICAL DATA:  Fall EXAM: CT HEAD WITHOUT CONTRAST CT CERVICAL SPINE WITHOUT CONTRAST TECHNIQUE: Multidetector CT imaging of the head and cervical spine was performed following the standard protocol without intravenous contrast. Multiplanar CT image reconstructions of the cervical spine were also generated. COMPARISON:  None. FINDINGS: CT HEAD FINDINGS Brain: There is no mass, hemorrhage or extra-axial collection. The size and configuration of the ventricles and extra-axial CSF spaces are normal. Areas of hypoattenuation of the deep gray nuclei and confluent periventricular white matter hypodensity, consistent with chronic small vessel disease. 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 Vascular: No abnormal hyperdensity of the major intracranial arteries or dural venous sinuses. No intracranial atherosclerosis. Unchanged calcification along the course of the left M2 segment. Skull: Remote right-sided craniotomy. Sinuses/Orbits: No fluid levels or advanced mucosal thickening of the visualized paranasal sinuses. No mastoid or middle ear effusion. The orbits are normal. CT CERVICAL SPINE FINDINGS Alignment: No static subluxation. Facets are aligned. Occipital condyles are normally positioned. Skull base and vertebrae: No acute fracture. Soft tissues and spinal canal: No prevertebral fluid or swelling. No visible canal hematoma. Disc levels: No bony spinal canal  stenosis. Multilevel mild vertebral body height loss, particularly at C3 and C4. Facet hypertrophy is worst at right C3-4 and left C4-5. Upper chest: No pneumothorax, pulmonary nodule or pleural effusion. Other: Normal visualized paraspinal cervical soft tissues. IMPRESSION: 1. Chronic ischemic microangiopathy without acute intracranial abnormality. 2. No acute fracture or static subluxation of the cervical spine. Electronically Signed   By: Ulyses Jarred M.D.   On: 02/04/2020 00:54   DG Hip Unilat W or Wo Pelvis 2-3 Views Right  Result Date: 02/04/2020 CLINICAL DATA:  Right elbow and hip pain after fall EXAM: DG HIP (WITH OR WITHOUT PELVIS) 2-3V RIGHT COMPARISON:  None. FINDINGS: Nondisplaced intertrochanteric fracture, best seen on cross-table lateral view. No dislocation. No pelvic fracture. IMPRESSION: Nondisplaced right intertrochanteric fracture. Electronically Signed   By: Ulyses Jarred M.D.   On: 02/04/2020 00:34    EKG: Independently reviewed. Atrial fibrillation, rate 103, RBBB, borderline ST-depressions.   Assessment/Plan   1. Right hip fracture  - Presents with right hip pain after a fall and is found to have acute right intertrochanteric hip fracture  - Orthopedic surgery consulted by ED physician  - Based on the available data, Mr. Vanleer presents an estimated 1.5% risk of perioperative MI or cardiac arrest   - Continue NPO, pain-control, CMS checks, supportive care   2. Chronic diastolic and right-sided heart failure  - Appears euvolemic on admission  - Hold diuretics while npo, follow I/O's, continue beta-blocker as tolerated    3. Atrial fibrillation  - Patient is in atrial fibrillation in ED with rate low 100's  - CHADS-VASc is at least 43 (age x2, CHF, CAD) but he is no longer anticoagulated d/t hx of spontaneous SDH  - Continue cardiac monitoring, give evening dose metoprolol now and then IVP Lopressor if rate sustains >110 despite this and pain-control    4. CAD  - No  anginal complaints  - Continue statin   5. Anemia, thrombocytopenia  - Hgb is 12.0 and platelets 125k in ED, both improved from priors  - Type and screen was performed in ED, CBC will be repeated in am   6. CKD stage IIIa  - SCr is 1.37 in ED, similar to priors  - Renally-dose medications, monitor    DVT prophylaxis: SCD's  Code Status: Full  Family Communication:  Discussed with patient  Disposition Plan: From home. May need SNF placement once cleared by surgery for hospital discharge.  Consults called: Orthopedic surgery consulted by ED physician  Admission status: Inpatient    Vianne Bulls, MD [facility] Pager: See [email or web address]  If 7AM-7PM, please contact the daytime attending [email or web address]  02/04/2020, 2:52 AM

## 2020-02-04 NOTE — Anesthesia Procedure Notes (Signed)
Procedure Name: Intubation Date/Time: 02/04/2020 4:06 PM Performed by: Kyung Rudd, CRNA Pre-anesthesia Checklist: Patient identified, Emergency Drugs available, Suction available, Patient being monitored and Timeout performed Patient Re-evaluated:Patient Re-evaluated prior to induction Oxygen Delivery Method: Circle system utilized Preoxygenation: Pre-oxygenation with 100% oxygen Induction Type: IV induction Ventilation: Mask ventilation without difficulty Laryngoscope Size: Mac and 4 Grade View: Grade I Tube type: Oral Tube size: 7.5 mm Number of attempts: 1 Airway Equipment and Method: Stylet Placement Confirmation: ETT inserted through vocal cords under direct vision,  positive ETCO2 and breath sounds checked- equal and bilateral Secured at: 21 cm Tube secured with: Tape Dental Injury: Teeth and Oropharynx as per pre-operative assessment

## 2020-02-04 NOTE — Consult Note (Signed)
[name]. is an 79 y.o. male.    Chief Complaint: right hip pain  HPI: 79 y/o male sustained a ground level fall late yesterday. Pt does not know how he fell. Currently c/o right elbow pain and right hip pain. Pt noted to have several skin abrasions to right elbow and x-rays show right intertroch femur fracture. Pt currently in [place] collar for healing cervical fracture. Denies any numbness or tingling distally. Unable to ambulate.   PCP:  [name], MD  PMH: Past Medical History:  Diagnosis Date  . (HFpEF) heart failure with preserved ejection fraction ([place])    a. 05/2013 Echo: EF 55%, mild LVH, diast dysfxn, Ao sclerosis, mildly dil LA, RV dysfxn (poorly visualized), PASP 41mmHg;  b. 03/2017 Echo: EF 55-60%, no rwma, triv MR, mildly dil RV, mod TR, PASP 33mmHg.  . Atrial fibrillation [facility])    s/p Cox Maze 1/09;  Multaq Rx d/c'd in 2014 due to pulmo fibrosis;  coumadin d/c'd in 2014 due to spontaneous subdural hematoma  . BPH (benign prostatic hyperplasia)   . CAD (coronary artery disease), native coronary artery    a. s/p CABG 12/2007;  b. Myoview 12/2011: EF 66%, no scar or ischemia; normal.  . DM (diabetes mellitus) ([place])   . Hyperlipidemia type II   . Hypertension   . 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 OSA (obstructive sleep apnea)   . Pacemaker    PPM - [employer]  . Peripheral neuropathy 07/31/2018  . Pulmonary fibrosis ([place])    Multaq d/c'd 7/14  . Subdural hematoma ([place]) 07/2012   spontaneous;  coumadin d/c'd => no longer a candidate for anticoagulation    PSes:  Allergies  Allergen Reactions  . Lipitor [Atorvastatin] Other (See Comments)    Stiff joints  . 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 Warfarin And Related Other (See Comments)    Stiff joints  . Enbrel [Etanercept] Itching    Medications: Current Facility-Administered Medications  Medication Dose Route Frequency Provider Last Rate  Last Admin  . metoprolol tartrate (LOPRESSOR) tablet 25 mg  25 mg Oral BID Opyd, Ilene Qua, MD      . mirabegron ER (MYRBETRIQ) tablet 50 mg  50 mg Oral Daily Opyd, Ilene Qua, MD      . morphine 2 MG/ML injection 1-2 mg  1-2 mg Intravenous Q3H PRN Opyd, Ilene Qua, MD   1 mg at 02/04/20 0907  . ondansetron (ZOFRAN) injection 4 mg  4 mg Intravenous Q6H PRN Opyd, Ilene Qua, MD      . pantoprazole (PROTONIX) EC tablet 40 mg  40 mg Oral Daily Opyd, Ilene Qua, MD      . rosuvastatin (CRESTOR) tablet 40 mg  40 mg Oral QPM Opyd, Ilene Qua, MD      . senna-docusate (Senokot-S) tablet 1 tablet  1 tablet Oral QHS PRN Opyd, Ilene Qua, MD      . sertraline (ZOLOFT) tablet 200 mg  200 mg Oral Daily Opyd, Ilene Qua, MD      . tamsulosin (FLOMAX) capsule 0.4 mg  0.4 mg Oral QHS Opyd, Ilene Qua, MD        Results for orders placed or performed during the hospital encounter of 02/03/20 (from the past 48 hour(s))  CBC     Status: Abnormal   Collection Time: 02/04/20 12:40 AM  Result Value Ref Range   WBC 5.3 4.0 -  10.5 K/uL   RBC 3.90 (L) 4.22 - 5.81 MIL/uL   Hemoglobin 12.0 (L) 13.0 - 17.0 g/dL   HCT 37.5 (L) 39.0 - 52.0 %   MCV 96.2 80.0 - 100.0 fL   MCH 30.8 26.0 - 34.0 pg   MCHC 32.0 30.0 - 36.0 g/dL   RDW 15.0 11.5 - 15.5 %   Platelets 125 (L) 150 - 400 K/uL   nRBC 0.0 0.0 - 0.2 %    Comment: Performed at [facility] [address], [place], [place] [id number]  Basic metabolic panel     Status: Abnormal   Collection Time: 02/04/20 12:40 AM  Result Value Ref Range   Sodium 139 135 - 145 mmol/L   Potassium 4.4 3.5 - 5.1 mmol/L   Chloride 103 98 - 111 mmol/L   CO2 28 22 - 32 mmol/L   Glucose, Bld 102 (H) 70 - 99 mg/dL   BUN 23 8 - 23 mg/dL   Creatinine, Ser 1.37 (H) 0.61 - 1.24 mg/dL   Calcium 9.3 8.9 - 10.3 mg/dL   GFR calc non Af Amer 49 (L) >60 mL/min   GFR calc Af Amer 57 (L) >60 mL/min   Anion gap 8 5 - 15    Comment: Performed at [facility] [address],  [place], [place] [address]  Protime-INR     Status: None   Collection Time: 02/04/20 12:40 AM  Result Value Ref Range   Prothrombin Time 14.6 11.4 - 15.2 seconds   INR 1.2 0.8 - 1.2    Comment: (NOTE) INR goal varies based on device and disease states. Performed at [facility], [place] [address], [place], [place] [address]   Type and screen     Status: None   Collection Time: 02/04/20 12:40 AM  Result Value Ref Range   ABO/RH(D) A POS    Antibody Screen NEG    Sample Expiration      02/07/2020,2359 Performed at [facility], [place] [address] [place], [place] [address]   Respiratory Panel by RT PCR (Flu A&B, Covid) - Nasopharyngeal Swab     Status: None   Collection Time: 02/04/20 12:44 AM   Specimen: Nasopharyngeal Swab  Result Value Ref Range   SARS Coronavirus 2 by RT PCR NEGATIVE NEGATIVE    Comment: (NOTE) SARS-CoV-2 target nucleic acids are NOT DETECTED. The SARS-CoV-2 RNA is generally detectable in upper respiratoy specimens during the acute phase of infection. The lowest concentration of SARS-CoV-2 viral copies this assay can detect is 131 copies/mL. A negative result does not preclude SARS-Cov-2 infection and should not be used as the sole basis for treatment or other patient management decisions. A negative result may occur with  improper specimen collection/handling, submission of specimen other than nasopharyngeal swab, presence of viral mutation(s) within the areas targeted by this assay, and inadequate number of viral copies (<131 copies/mL). A negative result must be combined with clinical observations, patient history, and epidemiological information. The expected result is Negative. Fact Sheet for Patients:  PinkCheek.be Fact Sheet for Healthcare Providers:  GravelBags.it This test is not yet ap proved or cleared by the [place] FDA and  has been authorized for detection and/or diagnosis of  SARS-CoV-2 by FDA under an Emergency Use Authorization (EUA). This EUA will remain  in effect (meaning this test can be used) for the duration of the COVID-19 declaration under Section 564(b)(1) of the Act, 21 U.S.C. section 360bbb-3(b)(1), unless the authorization  is terminated or revoked sooner.    Influenza A by PCR NEGATIVE NEGATIVE   Influenza B by PCR NEGATIVE NEGATIVE    Comment: (NOTE) The Xpert Xpress SARS-CoV-2/FLU/RSV assay is intended as an aid in  the diagnosis of influenza from Nasopharyngeal swab specimens and  should not be used as a sole basis for treatment. Nasal washings and  aspirates are unacceptable for Xpert Xpress SARS-CoV-2/FLU/RSV  testing. Fact Sheet for Patients: PinkCheek.be Fact Sheet for Healthcare Providers: GravelBags.it This test is not yet approved or cleared by the [place] FDA and  has been authorized for detection and/or diagnosis of SARS-CoV-2 by  FDA under an Emergency Use Authorization (EUA). This EUA will remain  in effect (meaning this test can be used) for the duration of the  Covid-19 declaration under Section 564(b)(1) of the Act, 21  U.S.C. section 360bbb-3(b)(1), unless the authorization is  terminated or revoked. Performed at [facility], [place] [address] [place], [place] [address]   Urinalysis, Routine w reflex microscopic     Status: Abnormal   Collection Time: 02/04/20  2:12 AM  Result Value Ref Range   Color, Urine STRAW (A) YELLOW   APPearance CLEAR CLEAR   Specific Gravity, Urine 1.008 1.005 - 1.030   pH 6.0 5.0 - 8.0   Glucose, UA NEGATIVE NEGATIVE mg/dL   Hgb urine dipstick SMALL (A) NEGATIVE   Bilirubin Urine NEGATIVE NEGATIVE   Ketones, ur NEGATIVE NEGATIVE mg/dL   Protein, ur NEGATIVE NEGATIVE mg/dL   Nitrite NEGATIVE NEGATIVE   Leukocytes,Ua NEGATIVE NEGATIVE   RBC / HPF 0-5 0 - 5 RBC/hpf   WBC, UA 0-5 0 - 5 WBC/hpf   Bacteria, UA NONE SEEN NONE  SEEN   Hyaline Casts, UA PRESENT     Comment: Performed at [facility] [address], [place], [place] [address]  CBC     Status: Abnormal   Collection Time: 02/04/20  4:18 AM  Result Value Ref Range   WBC 6.1 4.0 - 10.5 K/uL   RBC 3.64 (L) 4.22 - 5.81 MIL/uL   Hemoglobin 11.2 (L) 13.0 - 17.0 g/dL   HCT 34.0 (L) 39.0 - 52.0 %   MCV 93.4 80.0 - 100.0 fL   MCH 30.8 26.0 - 34.0 pg   MCHC 32.9 30.0 - 36.0 g/dL   RDW 14.9 11.5 - 15.5 %   Platelets 111 (L) 150 - 400 K/uL    Comment: REPEATED TO VERIFY PLATELET COUNT CONFIRMED BY SMEAR SPECIMEN CHECKED FOR CLOTS Immature Platelet Fraction may be clinically indicated, consider ordering this additional test [id number]    nRBC 0.0 0.0 - 0.2 %    Comment: Performed at [facility], [place] [address], [place], [place] [id number]  Basic metabolic panel     Status: Abnormal   Collection Time: 02/04/20  4:18 AM  Result Value Ref Range   Sodium 141 135 - 145 mmol/L   Potassium 3.9 3.5 - 5.1 mmol/L   Chloride 101 98 - 111 mmol/L   CO2 27 22 - 32 mmol/L   Glucose, Bld 104 (H) 70 - 99 mg/dL   BUN 24 (H) 8 - 23 mg/dL   Creatinine, Ser 1.30 (H) 0.61 - 1.24 mg/dL   Calcium 9.4 8.9 - 10.3 mg/dL   GFR calc non Af Amer 53 (L) >60 mL/min   GFR calc Af Amer >60 >60 mL/min   Anion gap 13 5 - 15    Comment: Performed at [facility], [place] [address] [place], [place] [address]   DG Chest 1 View  Result Date: 02/04/2020 CLINICAL DATA:  Fall EXAM: CHEST  1 VIEW COMPARISON:  05/15/2019 FINDINGS: The heart size and mediastinal contours are within normal limits. Both lungs are clear. The visualized skeletal structures are unremarkable. Median sternotomy and left chest wall pacemaker. IMPRESSION: No active disease. Electronically Signed   By: Ulyses Jarred M.D.   On: 02/04/2020 00:37   DG Elbow 2 Views Right  Result Date: 02/04/2020 CLINICAL DATA:  Right elbow and hip pain after fall EXAM: RIGHT ELBOW - 2 VIEW COMPARISON:  None. FINDINGS:  There is no evidence of fracture, dislocation, or joint effusion. There is no evidence of arthropathy or other focal bone abnormality. Soft tissues are unremarkable. IMPRESSION: Negative. Electronically Signed   By: Ulyses Jarred M.D.   On: 02/04/2020 00:29   CT Head Wo Contrast  Result Date: 02/04/2020 CLINICAL DATA:  Fall EXAM: CT HEAD WITHOUT CONTRAST CT CERVICAL SPINE WITHOUT CONTRAST TECHNIQUE: Multidetector CT imaging of the head and cervical spine was performed following the standard protocol without intravenous contrast. Multiplanar CT image reconstructions of the cervical spine were also generated. COMPARISON:  None. FINDINGS: CT HEAD FINDINGS Brain: There is no mass, hemorrhage or extra-axial collection. The size and configuration of the ventricles and extra-axial CSF spaces are normal. Areas of hypoattenuation of the deep gray nuclei and confluent periventricular white matter hypodensity, consistent with chronic small vessel disease. 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 Vascular: No abnormal hyperdensity of the major intracranial arteries or dural venous sinuses. No intracranial atherosclerosis. Unchanged calcification along the course of the left M2 segment. Skull: Remote right-sided craniotomy. Sinuses/Orbits: No fluid levels or advanced mucosal thickening of the visualized paranasal sinuses. No mastoid or middle ear effusion. The orbits are normal. CT CERVICAL SPINE FINDINGS Alignment: No static subluxation. Facets are aligned. Occipital condyles are normally positioned. Skull base and vertebrae: No acute fracture. Soft tissues and spinal canal: No prevertebral fluid or swelling. No visible canal hematoma. Disc levels: No bony spinal canal stenosis. Multilevel mild vertebral body height loss, particularly at C3 and C4. Facet hypertrophy is worst at right C3-4 and left C4-5. Upper chest: No pneumothorax, pulmonary nodule or pleural effusion. Other: Normal visualized paraspinal  cervical soft tissues. IMPRESSION: 1. Chronic ischemic microangiopathy without acute intracranial abnormality. 2. No acute fracture or static subluxation of the cervical spine. Electronically Signed   By: Ulyses Jarred M.D.   On: 02/04/2020 00:54   CT Cervical Spine Wo Contrast  Result Date: 02/04/2020 CLINICAL DATA:  Fall EXAM: CT HEAD WITHOUT CONTRAST CT CERVICAL SPINE WITHOUT CONTRAST TECHNIQUE: Multidetector CT imaging of the head and cervical spine was performed following the standard protocol without intravenous contrast. Multiplanar CT image reconstructions of the cervical spine were also generated. COMPARISON:  None. FINDINGS: CT HEAD FINDINGS Brain: There is no mass, hemorrhage or extra-axial collection. The size and configuration of the ventricles and extra-axial CSF spaces are normal. Areas of hypoattenuation of the deep gray nuclei and confluent periventricular white matter hypodensity, consistent with chronic small vessel disease. 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 Vascular: No abnormal hyperdensity of the major intracranial arteries or dural venous sinuses. No intracranial atherosclerosis. Unchanged calcification along the course of the left M2 segment. Skull: Remote right-sided craniotomy. Sinuses/Orbits: No fluid levels or advanced mucosal thickening of the visualized paranasal sinuses. No mastoid or middle ear effusion. The orbits are normal. CT CERVICAL SPINE FINDINGS Alignment:  No static subluxation. Facets are aligned. Occipital condyles are normally positioned. Skull base and vertebrae: No acute fracture. Soft tissues and spinal canal: No prevertebral fluid or swelling. No visible canal hematoma. Disc levels: No bony spinal canal stenosis. Multilevel mild vertebral body height loss, particularly at C3 and C4. Facet hypertrophy is worst at right C3-4 and left C4-5. Upper chest: No pneumothorax, pulmonary nodule or pleural effusion. Other: Normal visualized paraspinal  cervical soft tissues. IMPRESSION: 1. Chronic ischemic microangiopathy without acute intracranial abnormality. 2. No acute fracture or static subluxation of the cervical spine. Electronically Signed   By: Ulyses Jarred M.D.   On: 02/04/2020 00:54   DG Hip Unilat W or Wo Pelvis 2-3 Views Right  Result Date: 02/04/2020 CLINICAL DATA:  Right elbow and hip pain after fall EXAM: DG HIP (WITH OR WITHOUT PELVIS) 2-3V RIGHT COMPARISON:  None. FINDINGS: Nondisplaced intertrochanteric fracture, best seen on cross-table lateral view. No dislocation. No pelvic fracture. IMPRESSION: Nondisplaced right intertrochanteric fracture. Electronically Signed   By: Ulyses Jarred M.D.   On: 02/04/2020 00:34    ROS: [place] collar for cervical fracture Unable to ambulate currently  Physical Exam: Alert and appropriate 79 y/o male Cervical/aspen collar in place in good position Right elbow with dressing applied to skin tears/abrasions  Full rom of bilateral upper extremities without deformity or pain Pelvis tender to right side Slight shortening and external rotation of right leg consistent with fracture nv intact distally No signs of open injury or skin tears to either lower extremity  Physical Exam   Assessment/Plan Assessment: right femur fracture   Plan: Pt has been admitted by medical team Plan for surgical management of right femur fracture this afternoon Keep NPO Strict bedrest Pain management   Brad Luna [place], [place] is now [facility]  [place] [address] [place] [address], [place], [place] [address] Phone: [phone] [email or web address]  [employer]

## 2020-02-04 NOTE — Brief Op Note (Signed)
02/04/2020  5:37 PM  PATIENT:  [name].  79 y.o. male  PRE-OPERATIVE DIAGNOSIS:  Intertrochanteric right femur fracture  POST-OPERATIVE DIAGNOSIS:  Intertrochanteric right femur fracture  PROCEDURE:  Procedure(s): INTRAMEDULLARY (IM) NAIL INTERTROCHANTRIC (Right) short nail  SURGEON:  Surgeon(s) and Role:    Netta Cedars, MD - Primary  PHYSICIAN ASSISTANT:   ASSISTANTS: Ventura Bruns, PA-C    ANESTHESIA:   General  EBL:  Minimal  BLOOD ADMINISTERED:none  DRAINS: none   LOCAL MEDICATIONS USED:  NONE  SPECIMEN:  No Specimen  DISPOSITION OF SPECIMEN:  N/A  COUNTS:  YES  TOURNIQUET:  * No tourniquets in log *  DICTATION: .Other Dictation: Dictation Number [id number]  PLAN OF CARE: Admit to inpatient   PATIENT DISPOSITION:  PACU - hemodynamically stable.   Delay start of Pharmacological VTE agent (>24hrs) due to surgical blood loss or risk of bleeding: no

## 2020-02-04 NOTE — Interval H&P Note (Signed)
History and Physical Interval Note:  02/04/2020 3:54 PM  Jamie G Kapler Jr.  has presented today for surgery, with the diagnosis of intertrochanteric right femur fracture.  The various methods of treatment have been discussed with the patient and family. After consideration of risks, benefits and other options for treatment, the patient has consented to  Procedure(s): INTRAMEDULLARY (IM) NAIL INTERTROCHANTRIC (Right) as a surgical intervention.  The patient's history has been reviewed, patient examined, no change in status, stable for surgery.  I have reviewed the patient's chart and labs.  Questions were answered to the patient's satisfaction.     Augustin Schooling

## 2020-02-04 NOTE — ED Notes (Signed)
Patient wife Alfons Ramamurthy asking for a call back, just would like an update  Please call [phone]

## 2020-02-04 NOTE — Discharge Instructions (Signed)
Keep the hip incisions clean and dry for one week, then ok to get them wet in the shower.  Please limit your weight on the right leg to 25% body weight to protect the hip while it heals, use the walker to limit the weight as instructed by therapy.  Ice to the hip and leg incisions  Perform daily washings of the feet with antibiotic soap and water and apply fresh gauze bandages to the ulcers on the toes.  Purchase a toe loop device from a medical supply store to hold the toes down to keep the toes from hammering up and getting rubbed by your shoes. Use the post op shoe and until the ulcers heal.   Follow up in two weeks in the office with Dr [name], call [phone] for appt

## 2020-02-04 NOTE — Progress Notes (Signed)
Initial Nutrition Assessment  DOCUMENTATION CODES:   Not applicable  INTERVENTION:   -MVI with minerals daily --1 packet Juven BID, each packet provides 95 calories, 2.5 grams of protein (collagen), and 9.8 grams of carbohydrate (3 grams sugar); also contains 7 grams of L-arginine and L-glutamine, 300 mg vitamin C, 15 mg vitamin E, 1.2 mcg vitamin B-12, 9.5 mg zinc, 200 mg calcium, and 1.5 g  Calcium Beta-hydroxy-Beta-methylbutyrate to support wound healing -Ensure Enlive po daily, each supplement provides 350 kcal and 20 grams of protein  NUTRITION DIAGNOSIS:   Increased nutrient needs related to wound healing, post-op healing as evidenced by estimated needs.  GOAL:   Patient will meet greater than or equal to 90% of their needs  MONITOR:   PO intake, Supplement acceptance, Diet advancement, Labs, Weight trends, Skin, I & O's  REASON FOR ASSESSMENT:   Consult Hip fracture protocol  ASSESSMENT:   Jamie G Jaco Treas. is a 79 y.o. male with medical history significant for chronic diastolic and right-sided heart failure, atrial fibrillation status post Maze procedure no longer anticoagulated after spontaneous SDH, CAD status post CABG, symptomatic bradycardia now with pacer, chronic kidney disease stage IIIa, OSA on CPAP, chronic anemia and thrombocytopenia, pulmonary fibrosis, and frequent falls, now presenting to emergency department for evaluation of right hip pain after fall.  Patient reports that he had been in his usual state of health this evening when he lost balance and fell at home, initially striking a wall before falling to the ground.  He suffered right elbow injury and was having severe pain at the right hip following this.  He denies any loss of consciousness.  He has not been on anticoagulation due to history of subdural hematoma.  He has been wearing a cervical collar since suffering a cervical spine fracture in November.  He denies any recent chest pain, palpitations, leg  swelling, or worsening in his chronic dyspnea.  He denies fevers or chills.  Pt admitted with rt hip fracture.   Per orthopedics notes, plan for surgery this afternoon. He is currently NPO.  Pt sleeping soundly at time of visit. He did not respond to RD voice.   Reviewed wt hx; noted pt has experienced a 4.6% wt loss over the past 3 months, which is not significant for time frame  Medications reviewed and include lactated ringers infusion @ 10 ml/hr.   Labs reviewed.   NUTRITION - FOCUSED PHYSICAL EXAM:    Most Recent Value  Orbital Region  No depletion  Upper Arm Region  No depletion  Thoracic and Lumbar Region  No depletion  Buccal Region  No depletion  Temple Region  No depletion  Clavicle Bone Region  No depletion  Clavicle and Acromion Bone Region  No depletion  Scapular Bone Region  No depletion  Dorsal Hand  No depletion  Patellar Region  No depletion  Anterior Thigh Region  No depletion  Posterior Calf Region  No depletion  Edema (RD Assessment)  None  Hair  Reviewed  Eyes  Reviewed  Mouth  Reviewed  Skin  Reviewed  Nails  Reviewed       Diet Order:   Diet Order            Diet NPO time specified Except for: Ice Chips  Diet effective now              EDUCATION NEEDS:   No education needs have been identified at this time  Skin:  Skin Assessment: Skin Integrity  Issues: Skin Integrity Issues:: Other (Comment) Other: full thickness wound to rt toe  Last BM:  02/03/20  Height:   Ht Readings from Last 1 Encounters:  02/04/20 5' 7.8" (1.722 m)    Weight:   Wt Readings from Last 1 Encounters:  02/04/20 81.1 kg    Ideal Body Weight:  70 kg  BMI:  Body mass index is 27.35 kg/m.  Estimated Nutritional Needs:   Kcal:  2100-2300  Protein:  105-120 grams  Fluid:  > 2.1 L    Loistine Chance, RD, LDN, [place] Registered Dietitian II Certified Diabetes Care and Education Specialist Please refer to [facility] for RD and/or RD on-call/weekend/after  hours pager

## 2020-02-04 NOTE — Transfer of Care (Deleted)
Immediate Anesthesia Transfer of Care Note  Patient: [name].  Procedure(s) Performed: INTRAMEDULLARY (IM) NAIL INTERTROCHANTRIC (Right )  Patient Location: PACU  Anesthesia Type:General  Level of Consciousness: drowsy, patient cooperative and responds to stimulation  Airway & Oxygen Therapy: Patient Spontanous Breathing and Patient connected to face mask oxygen  Post-op Assessment: Report given to RN and Post -op Vital signs reviewed and stable  Post vital signs: reviewed and stable  Last Vitals:  Vitals Value Taken Time  BP 110/76 02/04/20 1806  Temp 36.5 C 02/04/20 1750  Pulse 106 02/04/20 1806  Resp 12 02/04/20 1806  SpO2 100 % 02/04/20 1806  Vitals shown include unvalidated device data.  Last Pain:  Vitals:   02/04/20 1750  TempSrc:   PainSc: Asleep         Complications: No apparent anesthesia complications

## 2020-02-04 NOTE — Progress Notes (Signed)
PROGRESS NOTE  [name]. WUJ:[id number] DOB: 21-Jun-1941 DOA: 02/03/2020 PCP: [name], MD  HPI/Recap of past 24 hours: Denies pain,npo , awaiting for hip surgery  Assessment/Plan: Principal Problem:   Closed right hip fracture, initial encounter [facility]) Active Problems:   Atrial fibrillation ([place])   Postinflammatory pulmonary fibrosis (HCC)   Thrombocytopenia (HCC)   Anemia, chronic disease   Chronic right-sided CHF (congestive heart failure) ([place])   Chronic diastolic heart failure ([place])   Coronary artery disease involving native heart without angina pectoris  acute right intertrochanteric hip fracture with h/o frequent falls -patient lost balance and fell into wall.  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 Saintclair Halsted.   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 Denies chest pain  chronic right sided CHF,  Per his cardiologist Dr [name] "His right-sided congestive heart failure is felt secondary to a combination of pulmonary venous hypertension, pulmonary fibrosis and sleep apnea. Currently euvolemic, no edema, cxr no acute findings  Home meds demadex held due to npo status and anticipating hip surgery, plan to resume diuretics post op Close monitor volume status  severe tricuspid regurgitation- Per cardiology "likely secondary to right ventricular enlargement related to pulmonary hypertension versus trauma from pacemaker.  Patient is not a surgical candidate given multiple comorbidities."  H/o  mild to moderate pulmonary fibrosis on CT chest in 07/2019. Pulmonary function test November 2020 normal.   chronic Anemia, thrombocytopenia  - he is followed  By Dr [name] - Type and screen was performed in ED -monitor CBC  CKD stage IIIa  - SCr is 1.37 in ED, similar to priors  - Renally-dose medications, monitor   OSA on nightly cpap chronically  DVT Prophylaxis: scd's  Code Status: full  Family Communication: wife Travone Georg  [phone] with permission  Disposition Plan:    Patient came from:        home                                                                                                  Anticipated d/c place: likely will need SNF  Barriers to d/c OR conditions which need to be met to effect a safe d/c: needs ortho clearance   Consultants:  Dr [name] ( right hip)  Dr [name] (right toe)  Procedures:  Right hip surgery  Antibiotics:  Perioperative per ortho   Objective: BP 119/87 (BP Location: Left Arm)   Pulse 99   Temp (!) 97.4 F (36.3 C) (Oral)   Resp 18   Wt (P) 81.1 kg   SpO2 100%   BMI (  P) 27.19 kg/m  No intake or output data in the 24 hours ending 02/04/20 1032 Filed Weights   02/04/20 0950  Weight: (P) 81.1 kg    Exam: Patient is examined daily including today on 02/04/2020, exams remain the same as of yesterday except that has changed    General:  NAD, wearing a neck collar, report has been on it for 3-4 weeks  Cardiovascular: IRRR  Respiratory: CTABL  Abdomen: Soft/ND/NT, positive BS  Musculoskeletal: No Edema, skin tear to right elvow,  right hip tender,  right foot toe findings, see pic below  Neuro: alert, oriented to place, person and situation, slightly confused about the year, able to redirected, (not sure baseline vs from narcotic pain meds)      Data Reviewed: Basic Metabolic Panel: Recent Labs  Lab 02/03/20 1211 02/04/20 0040 02/04/20 0418  NA 142 139 141  K 4.5 4.4 3.9  CL 102 103 101  CO2  '27 28 27  ' GLUCOSE 81 102* 104*  BUN 23 23 24*  CREATININE 1.18 1.37* 1.30*  CALCIUM 9.3 9.3 9.4   Liver Function Tests: No results for input(s): AST, ALT, ALKPHOS, BILITOT, PROT, ALBUMIN in the last 168 hours. No results for input(s): LIPASE, AMYLASE in the last 168 hours. No results for input(s): AMMONIA in the last 168 hours. CBC: Recent Labs  Lab 02/04/20 0040 02/04/20 0418  WBC 5.3 6.1  HGB 12.0* 11.2*  HCT 37.5* 34.0*  MCV 96.2 93.4  PLT 125* 111*   Cardiac Enzymes:   No results for input(s): CKTOTAL, CKMB, CKMBINDEX, TROPONINI in the last 168 hours. BNP (last 3 results) Recent Labs    05/16/19 0609  BNP 289.1*    ProBNP (last 3 results) No results for input(s): PROBNP in the last 8760 hours.  CBG: No results for input(s): GLUCAP in the last 168 hours.  Recent Results (from the past 240 hour(s))  SARS CORONAVIRUS 2 (TAT 6-24 HRS) Nasopharyngeal Nasopharyngeal Swab     Status: None   Collection Time: 01/26/20  1:23 PM   Specimen: Nasopharyngeal Swab  Result Value Ref Range Status   SARS Coronavirus 2 NEGATIVE NEGATIVE Final    Comment: (NOTE) SARS-CoV-2 target nucleic acids are NOT DETECTED. The SARS-CoV-2 RNA is generally detectable in upper and lower respiratory specimens during the acute phase of infection. Negative results do not preclude SARS-CoV-2 infection, do not rule out co-infections with other pathogens, and should not be used as the sole basis for treatment or other patient management decisions. Negative results must be combined with clinical observations, patient history, and epidemiological information. The expected result is Negative. Fact Sheet for Patients: SugarRoll.be Fact Sheet for Healthcare Providers: https://www.woods-mathews.com/ This test is not yet approved or cleared by the [place] FDA and  has been authorized for detection and/or diagnosis of SARS-CoV-2 by FDA under an Emergency Use  Authorization (EUA). This EUA will remain  in effect (meaning this test can be used) for the duration of the COVID-19 declaration under Section 56 4(b)(1) of the Act, 21 U.S.C. section 360bbb-3(b)(1), unless the authorization is terminated or revoked sooner. Performed at [facility], [place] [address], [place], [place] [address]   Respiratory Panel by RT PCR (Flu A&B, Covid) - Nasopharyngeal Swab     Status: None   Collection Time: 02/04/20 12:44 AM   Specimen: Nasopharyngeal Swab  Result Value Ref Range Status   SARS Coronavirus 2 by RT PCR NEGATIVE NEGATIVE Final    Comment: (NOTE) SARS-CoV-2 target nucleic  acids are NOT DETECTED. The SARS-CoV-2 RNA is generally detectable in upper respiratoy specimens during the acute phase of infection. The lowest concentration of SARS-CoV-2 viral copies this assay can detect is 131 copies/mL. A negative result does not preclude SARS-Cov-2 infection and should not be used as the sole basis for treatment or other patient management decisions. A negative result may occur with  improper specimen collection/handling, submission of specimen other than nasopharyngeal swab, presence of viral mutation(s) within the areas targeted by this assay, and inadequate number of viral copies (<131 copies/mL). A negative result must be combined with clinical observations, patient history, and epidemiological information. The expected result is Negative. Fact Sheet for Patients:  PinkCheek.be Fact Sheet for Healthcare Providers:  GravelBags.it This test is not yet ap proved or cleared by the [place] FDA and  has been authorized for detection and/or diagnosis of SARS-CoV-2 by FDA under an Emergency Use Authorization (EUA). This EUA will remain  in effect (meaning this test can be used) for the duration of the COVID-19 declaration under Section 564(b)(1) of the Act, 21 U.S.C. section 360bbb-3(b)(1),  unless the authorization is terminated or revoked sooner.    Influenza A by PCR NEGATIVE NEGATIVE Final   Influenza B by PCR NEGATIVE NEGATIVE Final    Comment: (NOTE) The Xpert Xpress SARS-CoV-2/FLU/RSV assay is intended as an aid in  the diagnosis of influenza from Nasopharyngeal swab specimens and  should not be used as a sole basis for treatment. Nasal washings and  aspirates are unacceptable for Xpert Xpress SARS-CoV-2/FLU/RSV  testing. Fact Sheet for Patients: PinkCheek.be Fact Sheet for Healthcare Providers: GravelBags.it This test is not yet approved or cleared by the [place] FDA and  has been authorized for detection and/or diagnosis of SARS-CoV-2 by  FDA under an Emergency Use Authorization (EUA). This EUA will remain  in effect (meaning this test can be used) for the duration of the  Covid-19 declaration under Section 564(b)(1) of the Act, 21  U.S.C. section 360bbb-3(b)(1), unless the authorization is  terminated or revoked. Performed at [facility], [place] [address], [place], [place] [address]      Studies: DG Chest 1 View  Result Date: 02/04/2020 CLINICAL DATA:  Fall EXAM: CHEST  1 VIEW COMPARISON:  05/15/2019 FINDINGS: The heart size and mediastinal contours are within normal limits. Both lungs are clear. The visualized skeletal structures are unremarkable. Median sternotomy and left chest wall pacemaker. IMPRESSION: No active disease. Electronically Signed   By: Ulyses Jarred M.D.   On: 02/04/2020 00:37   DG Elbow 2 Views Right  Result Date: 02/04/2020 CLINICAL DATA:  Right elbow and hip pain after fall EXAM: RIGHT ELBOW - 2 VIEW COMPARISON:  None. FINDINGS: There is no evidence of fracture, dislocation, or joint effusion. There is no evidence of arthropathy or other focal bone abnormality. Soft tissues are unremarkable. IMPRESSION: Negative. Electronically Signed   By: Ulyses Jarred M.D.   On:  02/04/2020 00:29   CT Head Wo Contrast  Result Date: 02/04/2020 CLINICAL DATA:  Fall EXAM: CT HEAD WITHOUT CONTRAST CT CERVICAL SPINE WITHOUT CONTRAST TECHNIQUE: Multidetector CT imaging of the head and cervical spine was performed following the standard protocol without intravenous contrast. Multiplanar CT image reconstructions of the cervical spine were also generated. COMPARISON:  None. FINDINGS: CT HEAD FINDINGS Brain: There is no mass, hemorrhage or extra-axial collection. The size and configuration of the ventricles and extra-axial CSF spaces are normal. Areas of hypoattenuation of the deep gray nuclei  and confluent periventricular white matter hypodensity, consistent with chronic small vessel disease. 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 Vascular: No abnormal hyperdensity of the major intracranial arteries or dural venous sinuses. No intracranial atherosclerosis. Unchanged calcification along the course of the left M2 segment. Skull: Remote right-sided craniotomy. Sinuses/Orbits: No fluid levels or advanced mucosal thickening of the visualized paranasal sinuses. No mastoid or middle ear effusion. The orbits are normal. CT CERVICAL SPINE FINDINGS Alignment: No static subluxation. Facets are aligned. Occipital condyles are normally positioned. Skull base and vertebrae: No acute fracture. Soft tissues and spinal canal: No prevertebral fluid or swelling. No visible canal hematoma. Disc levels: No bony spinal canal stenosis. Multilevel mild vertebral body height loss, particularly at C3 and C4. Facet hypertrophy is worst at right C3-4 and left C4-5. Upper chest: No pneumothorax, pulmonary nodule or pleural effusion. Other: Normal visualized paraspinal cervical soft tissues. IMPRESSION: 1. Chronic ischemic microangiopathy without acute intracranial abnormality. 2. No acute fracture or static subluxation of the cervical spine. Electronically Signed   By: Ulyses Jarred M.D.   On: 02/04/2020  00:54   CT Cervical Spine Wo Contrast  Result Date: 02/04/2020 CLINICAL DATA:  Fall EXAM: CT HEAD WITHOUT CONTRAST CT CERVICAL SPINE WITHOUT CONTRAST TECHNIQUE: Multidetector CT imaging of the head and cervical spine was performed following the standard protocol without intravenous contrast. Multiplanar CT image reconstructions of the cervical spine were also generated. COMPARISON:  None. FINDINGS: CT HEAD FINDINGS Brain: There is no mass, hemorrhage or extra-axial collection. The size and configuration of the ventricles and extra-axial CSF spaces are normal. Areas of hypoattenuation of the deep gray nuclei and confluent periventricular white matter hypodensity, consistent with chronic small vessel disease. 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 Vascular: No abnormal hyperdensity of the major intracranial arteries or dural venous sinuses. No intracranial atherosclerosis. Unchanged calcification along the course of the left M2 segment. Skull: Remote right-sided craniotomy. Sinuses/Orbits: No fluid levels or advanced mucosal thickening of the visualized paranasal sinuses. No mastoid or middle ear effusion. The orbits are normal. CT CERVICAL SPINE FINDINGS Alignment: No static subluxation. Facets are aligned. Occipital condyles are normally positioned. Skull base and vertebrae: No acute fracture. Soft tissues and spinal canal: No prevertebral fluid or swelling. No visible canal hematoma. Disc levels: No bony spinal canal stenosis. Multilevel mild vertebral body height loss, particularly at C3 and C4. Facet hypertrophy is worst at right C3-4 and left C4-5. Upper chest: No pneumothorax, pulmonary nodule or pleural effusion. Other: Normal visualized paraspinal cervical soft tissues. IMPRESSION: 1. Chronic ischemic microangiopathy without acute intracranial abnormality. 2. No acute fracture or static subluxation of the cervical spine. Electronically Signed   By: Ulyses Jarred M.D.   On: 02/04/2020  00:54   DG Hip Unilat W or Wo Pelvis 2-3 Views Right  Result Date: 02/04/2020 CLINICAL DATA:  Right elbow and hip pain after fall EXAM: DG HIP (WITH OR WITHOUT PELVIS) 2-3V RIGHT COMPARISON:  None. FINDINGS: Nondisplaced intertrochanteric fracture, best seen on cross-table lateral view. No dislocation. No pelvic fracture. IMPRESSION: Nondisplaced right intertrochanteric fracture. Electronically Signed   By: Ulyses Jarred M.D.   On: 02/04/2020 00:34    Scheduled Meds: . chlorhexidine  60 mL Topical Once  . metoprolol tartrate  25 mg Oral BID  . mirabegron ER  50 mg Oral Daily  . pantoprazole  40 mg Oral Daily  . rosuvastatin  40 mg Oral QPM  . sertraline  200 mg Oral Daily  . tamsulosin  0.4 mg Oral QHS    Continuous Infusions: .  ceFAZolin (ANCEF) IV       Time spent: 79mns I have personally reviewed and interpreted on  02/04/2020 daily labs, tele strips, imagings as discussed above under date review session and assessment and plans.  I reviewed all nursing notes, pharmacy notes, consultant notes,  vitals, pertinent old records  I have discussed plan of care as described above with RN , patient and family on 02/04/2020   FFlorencia ReasonsMD, PhD, FACP  [facility]  Available via Epic secure chat 7am-7pm for nonurgent issues Please page for urgent issues, pager number available through [email or web address] .   02/04/2020, 10:32 AM  LOS: 0 days

## 2020-02-04 NOTE — H&P (View-Only) (Signed)
[name]. is an 79 y.o. male.    Chief Complaint: right hip pain  HPI: 79 y/o male sustained a ground level fall late yesterday. Pt does not know how he fell. Currently c/o right elbow pain and right hip pain. Pt noted to have several skin abrasions to right elbow and x-rays show right intertroch femur fracture. Pt currently in [place] collar for healing cervical fracture. Denies any numbness or tingling distally. Unable to ambulate.   PCP:  [name], MD  PMH: Past Medical History:  Diagnosis Date  . (HFpEF) heart failure with preserved ejection fraction ([facility])    a. 05/2013 Echo: EF 55%, mild LVH, diast dysfxn, Ao sclerosis, mildly dil LA, RV dysfxn (poorly visualized), PASP 43mmHg;  b. 03/2017 Echo: EF 55-60%, no rwma, triv MR, mildly dil RV, mod TR, PASP 32mmHg.  . Atrial fibrillation [facility])    s/p [facility] 1/09;  Multaq Rx d/c'd in 2014 due to pulmo fibrosis;  coumadin d/c'd in 2014 due to spontaneous subdural hematoma  . BPH (benign prostatic hyperplasia)   . CAD (coronary artery disease), native coronary artery    a. s/p CABG 12/2007;  b. Myoview 12/2011: EF 66%, no scar or ischemia; normal.  . DM (diabetes mellitus) ([facility])   . Hyperlipidemia type II   . Hypertension   . 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 OSA (obstructive sleep apnea)   . Pacemaker    PPM - [employer]  . Peripheral neuropathy 07/31/2018  . Pulmonary fibrosis ([place])    Multaq d/c'd 7/14  . Subdural hematoma ([place]) 07/2012   spontaneous;  coumadin d/c'd => no longer a candidate for anticoagulation    PSes:  Allergies  Allergen Reactions  . Lipitor [Atorvastatin] Other (See Comments)    Stiff joints  . 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 Warfarin And Related Other (See Comments)    Stiff joints  . Enbrel [Etanercept] Itching    Medications: Current Facility-Administered Medications  Medication Dose Route Frequency Provider Last Rate  Last Admin  . metoprolol tartrate (LOPRESSOR) tablet 25 mg  25 mg Oral BID Opyd, Ilene Qua, MD      . mirabegron ER (MYRBETRIQ) tablet 50 mg  50 mg Oral Daily Opyd, Ilene Qua, MD      . morphine 2 MG/ML injection 1-2 mg  1-2 mg Intravenous Q3H PRN Opyd, Ilene Qua, MD   1 mg at 02/04/20 0907  . ondansetron (ZOFRAN) injection 4 mg  4 mg Intravenous Q6H PRN Opyd, Ilene Qua, MD      . pantoprazole (PROTONIX) EC tablet 40 mg  40 mg Oral Daily Opyd, Ilene Qua, MD      . rosuvastatin (CRESTOR) tablet 40 mg  40 mg Oral QPM Opyd, Ilene Qua, MD      . senna-docusate (Senokot-S) tablet 1 tablet  1 tablet Oral QHS PRN Opyd, Ilene Qua, MD      . sertraline (ZOLOFT) tablet 200 mg  200 mg Oral Daily Opyd, Ilene Qua, MD      . tamsulosin (FLOMAX) capsule 0.4 mg  0.4 mg Oral QHS Opyd, Ilene Qua, MD        Results for orders placed or performed during the hospital encounter of 02/03/20 (from the past 48 hour(s))  CBC     Status: Abnormal   Collection Time: 02/04/20 12:40 AM  Result Value Ref Range   WBC 5.3 4.0 -  10.5 K/uL   RBC 3.90 (L) 4.22 - 5.81 MIL/uL   Hemoglobin 12.0 (L) 13.0 - 17.0 g/dL   HCT 37.5 (L) 39.0 - 52.0 %   MCV 96.2 80.0 - 100.0 fL   MCH 30.8 26.0 - 34.0 pg   MCHC 32.0 30.0 - 36.0 g/dL   RDW 15.0 11.5 - 15.5 %   Platelets 125 (L) 150 - 400 K/uL   nRBC 0.0 0.0 - 0.2 %    Comment: Performed at [facility] [address] [place], [place] [id number]  Basic metabolic panel     Status: Abnormal   Collection Time: 02/04/20 12:40 AM  Result Value Ref Range   Sodium 139 135 - 145 mmol/L   Potassium 4.4 3.5 - 5.1 mmol/L   Chloride 103 98 - 111 mmol/L   CO2 28 22 - 32 mmol/L   Glucose, Bld 102 (H) 70 - 99 mg/dL   BUN 23 8 - 23 mg/dL   Creatinine, Ser 1.37 (H) 0.61 - 1.24 mg/dL   Calcium 9.3 8.9 - 10.3 mg/dL   GFR calc non Af Amer 49 (L) >60 mL/min   GFR calc Af Amer 57 (L) >60 mL/min   Anion gap 8 5 - 15    Comment: Performed at [facility] [address],  [place], [place] [address]  Protime-INR     Status: None   Collection Time: 02/04/20 12:40 AM  Result Value Ref Range   Prothrombin Time 14.6 11.4 - 15.2 seconds   INR 1.2 0.8 - 1.2    Comment: (NOTE) INR goal varies based on device and disease states. Performed at [facility], [place] [address], [place], [place] [address]   Type and screen     Status: None   Collection Time: 02/04/20 12:40 AM  Result Value Ref Range   ABO/RH(D) A POS    Antibody Screen NEG    Sample Expiration      02/07/2020,2359 Performed at [facility], [place] [address] [place], [place] [address]   Respiratory Panel by RT PCR (Flu A&B, Covid) - Nasopharyngeal Swab     Status: None   Collection Time: 02/04/20 12:44 AM   Specimen: Nasopharyngeal Swab  Result Value Ref Range   SARS Coronavirus 2 by RT PCR NEGATIVE NEGATIVE    Comment: (NOTE) SARS-CoV-2 target nucleic acids are NOT DETECTED. The SARS-CoV-2 RNA is generally detectable in upper respiratoy specimens during the acute phase of infection. The lowest concentration of SARS-CoV-2 viral copies this assay can detect is 131 copies/mL. A negative result does not preclude SARS-Cov-2 infection and should not be used as the sole basis for treatment or other patient management decisions. A negative result may occur with  improper specimen collection/handling, submission of specimen other than nasopharyngeal swab, presence of viral mutation(s) within the areas targeted by this assay, and inadequate number of viral copies (<131 copies/mL). A negative result must be combined with clinical observations, patient history, and epidemiological information. The expected result is Negative. Fact Sheet for Patients:  PinkCheek.be Fact Sheet for Healthcare Providers:  GravelBags.it This test is not yet ap proved or cleared by the [place] FDA and  has been authorized for detection and/or diagnosis of  SARS-CoV-2 by FDA under an Emergency Use Authorization (EUA). This EUA will remain  in effect (meaning this test can be used) for the duration of the COVID-19 declaration under Section 564(b)(1) of the Act, 21 U.S.C. section 360bbb-3(b)(1), unless the authorization  is terminated or revoked sooner.    Influenza A by PCR NEGATIVE NEGATIVE   Influenza B by PCR NEGATIVE NEGATIVE    Comment: (NOTE) The Xpert Xpress SARS-CoV-2/FLU/RSV assay is intended as an aid in  the diagnosis of influenza from Nasopharyngeal swab specimens and  should not be used as a sole basis for treatment. Nasal washings and  aspirates are unacceptable for Xpert Xpress SARS-CoV-2/FLU/RSV  testing. Fact Sheet for Patients: PinkCheek.be Fact Sheet for Healthcare Providers: GravelBags.it This test is not yet approved or cleared by the [place] FDA and  has been authorized for detection and/or diagnosis of SARS-CoV-2 by  FDA under an Emergency Use Authorization (EUA). This EUA will remain  in effect (meaning this test can be used) for the duration of the  Covid-19 declaration under Section 564(b)(1) of the Act, 21  U.S.C. section 360bbb-3(b)(1), unless the authorization is  terminated or revoked. Performed at [facility], [place] [address], [place], [place] [address]   Urinalysis, Routine w reflex microscopic     Status: Abnormal   Collection Time: 02/04/20  2:12 AM  Result Value Ref Range   Color, Urine STRAW (A) YELLOW   APPearance CLEAR CLEAR   Specific Gravity, Urine 1.008 1.005 - 1.030   pH 6.0 5.0 - 8.0   Glucose, UA NEGATIVE NEGATIVE mg/dL   Hgb urine dipstick SMALL (A) NEGATIVE   Bilirubin Urine NEGATIVE NEGATIVE   Ketones, ur NEGATIVE NEGATIVE mg/dL   Protein, ur NEGATIVE NEGATIVE mg/dL   Nitrite NEGATIVE NEGATIVE   Leukocytes,Ua NEGATIVE NEGATIVE   RBC / HPF 0-5 0 - 5 RBC/hpf   WBC, UA 0-5 0 - 5 WBC/hpf   Bacteria, UA NONE SEEN NONE  SEEN   Hyaline Casts, UA PRESENT     Comment: Performed at [facility] [address] [place], [place] [address]  CBC     Status: Abnormal   Collection Time: 02/04/20  4:18 AM  Result Value Ref Range   WBC 6.1 4.0 - 10.5 K/uL   RBC 3.64 (L) 4.22 - 5.81 MIL/uL   Hemoglobin 11.2 (L) 13.0 - 17.0 g/dL   HCT 34.0 (L) 39.0 - 52.0 %   MCV 93.4 80.0 - 100.0 fL   MCH 30.8 26.0 - 34.0 pg   MCHC 32.9 30.0 - 36.0 g/dL   RDW 14.9 11.5 - 15.5 %   Platelets 111 (L) 150 - 400 K/uL    Comment: REPEATED TO VERIFY PLATELET COUNT CONFIRMED BY SMEAR SPECIMEN CHECKED FOR CLOTS Immature Platelet Fraction may be clinically indicated, consider ordering this additional test [id number]    nRBC 0.0 0.0 - 0.2 %    Comment: Performed at [facility], [place] [address] [place], [place] [id number]  Basic metabolic panel     Status: Abnormal   Collection Time: 02/04/20  4:18 AM  Result Value Ref Range   Sodium 141 135 - 145 mmol/L   Potassium 3.9 3.5 - 5.1 mmol/L   Chloride 101 98 - 111 mmol/L   CO2 27 22 - 32 mmol/L   Glucose, Bld 104 (H) 70 - 99 mg/dL   BUN 24 (H) 8 - 23 mg/dL   Creatinine, Ser 1.30 (H) 0.61 - 1.24 mg/dL   Calcium 9.4 8.9 - 10.3 mg/dL   GFR calc non Af Amer 53 (L) >60 mL/min   GFR calc Af Amer >60 >60 mL/min   Anion gap 13 5 - 15    Comment: Performed at [facility], [place] [address], [place], [place] [address]   DG Chest 1 View  Result Date: 02/04/2020 CLINICAL DATA:  Fall EXAM: CHEST  1 VIEW COMPARISON:  05/15/2019 FINDINGS: The heart size and mediastinal contours are within normal limits. Both lungs are clear. The visualized skeletal structures are unremarkable. Median sternotomy and left chest wall pacemaker. IMPRESSION: No active disease. Electronically Signed   By: Ulyses Jarred M.D.   On: 02/04/2020 00:37   DG Elbow 2 Views Right  Result Date: 02/04/2020 CLINICAL DATA:  Right elbow and hip pain after fall EXAM: RIGHT ELBOW - 2 VIEW COMPARISON:  None. FINDINGS:  There is no evidence of fracture, dislocation, or joint effusion. There is no evidence of arthropathy or other focal bone abnormality. Soft tissues are unremarkable. IMPRESSION: Negative. Electronically Signed   By: Ulyses Jarred M.D.   On: 02/04/2020 00:29   CT Head Wo Contrast  Result Date: 02/04/2020 CLINICAL DATA:  Fall EXAM: CT HEAD WITHOUT CONTRAST CT CERVICAL SPINE WITHOUT CONTRAST TECHNIQUE: Multidetector CT imaging of the head and cervical spine was performed following the standard protocol without intravenous contrast. Multiplanar CT image reconstructions of the cervical spine were also generated. COMPARISON:  None. FINDINGS: CT HEAD FINDINGS Brain: There is no mass, hemorrhage or extra-axial collection. The size and configuration of the ventricles and extra-axial CSF spaces are normal. Areas of hypoattenuation of the deep gray nuclei and confluent periventricular white matter hypodensity, consistent with chronic small vessel disease. 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 Vascular: No abnormal hyperdensity of the major intracranial arteries or dural venous sinuses. No intracranial atherosclerosis. Unchanged calcification along the course of the left M2 segment. Skull: Remote right-sided craniotomy. Sinuses/Orbits: No fluid levels or advanced mucosal thickening of the visualized paranasal sinuses. No mastoid or middle ear effusion. The orbits are normal. CT CERVICAL SPINE FINDINGS Alignment: No static subluxation. Facets are aligned. Occipital condyles are normally positioned. Skull base and vertebrae: No acute fracture. Soft tissues and spinal canal: No prevertebral fluid or swelling. No visible canal hematoma. Disc levels: No bony spinal canal stenosis. Multilevel mild vertebral body height loss, particularly at C3 and C4. Facet hypertrophy is worst at right C3-4 and left C4-5. Upper chest: No pneumothorax, pulmonary nodule or pleural effusion. Other: Normal visualized paraspinal  cervical soft tissues. IMPRESSION: 1. Chronic ischemic microangiopathy without acute intracranial abnormality. 2. No acute fracture or static subluxation of the cervical spine. Electronically Signed   By: Ulyses Jarred M.D.   On: 02/04/2020 00:54   CT Cervical Spine Wo Contrast  Result Date: 02/04/2020 CLINICAL DATA:  Fall EXAM: CT HEAD WITHOUT CONTRAST CT CERVICAL SPINE WITHOUT CONTRAST TECHNIQUE: Multidetector CT imaging of the head and cervical spine was performed following the standard protocol without intravenous contrast. Multiplanar CT image reconstructions of the cervical spine were also generated. COMPARISON:  None. FINDINGS: CT HEAD FINDINGS Brain: There is no mass, hemorrhage or extra-axial collection. The size and configuration of the ventricles and extra-axial CSF spaces are normal. Areas of hypoattenuation of the deep gray nuclei and confluent periventricular white matter hypodensity, consistent with chronic small vessel disease. 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 Vascular: No abnormal hyperdensity of the major intracranial arteries or dural venous sinuses. No intracranial atherosclerosis. Unchanged calcification along the course of the left M2 segment. Skull: Remote right-sided craniotomy. Sinuses/Orbits: No fluid levels or advanced mucosal thickening of the visualized paranasal sinuses. No mastoid or middle ear effusion. The orbits are normal. CT CERVICAL SPINE FINDINGS Alignment:  No static subluxation. Facets are aligned. Occipital condyles are normally positioned. Skull base and vertebrae: No acute fracture. Soft tissues and spinal canal: No prevertebral fluid or swelling. No visible canal hematoma. Disc levels: No bony spinal canal stenosis. Multilevel mild vertebral body height loss, particularly at C3 and C4. Facet hypertrophy is worst at right C3-4 and left C4-5. Upper chest: No pneumothorax, pulmonary nodule or pleural effusion. Other: Normal visualized paraspinal  cervical soft tissues. IMPRESSION: 1. Chronic ischemic microangiopathy without acute intracranial abnormality. 2. No acute fracture or static subluxation of the cervical spine. Electronically Signed   By: Ulyses Jarred M.D.   On: 02/04/2020 00:54   DG Hip Unilat W or Wo Pelvis 2-3 Views Right  Result Date: 02/04/2020 CLINICAL DATA:  Right elbow and hip pain after fall EXAM: DG HIP (WITH OR WITHOUT PELVIS) 2-3V RIGHT COMPARISON:  None. FINDINGS: Nondisplaced intertrochanteric fracture, best seen on cross-table lateral view. No dislocation. No pelvic fracture. IMPRESSION: Nondisplaced right intertrochanteric fracture. Electronically Signed   By: Ulyses Jarred M.D.   On: 02/04/2020 00:34    ROS: [place] collar for cervical fracture Unable to ambulate currently  Physical Exam: Alert and appropriate 79 y/o male Cervical/aspen collar in place in good position Right elbow with dressing applied to skin tears/abrasions  Full rom of bilateral upper extremities without deformity or pain Pelvis tender to right side Slight shortening and external rotation of right leg consistent with fracture nv intact distally No signs of open injury or skin tears to either lower extremity  Physical Exam   Assessment/Plan Assessment: right femur fracture   Plan: Pt has been admitted by medical team Plan for surgical management of right femur fracture this afternoon Keep NPO Strict bedrest Pain management   Brad Luna [place], [place] is now [facility]  [place] [address], [address], [place], [place] [address] Phone: [phone] [email or web address] Facebook  [employer]

## 2020-02-04 NOTE — Progress Notes (Signed)
Auto CPAP set up for pt with home settings (Min 5-Max 16) and nasal mask. Spoke with RN about placing pt on CPAP after meds are given. Advised RN to call for RT if any further assistance is needed.

## 2020-02-05 ENCOUNTER — Other Ambulatory Visit: Payer: Self-pay | Admitting: Physician Assistant

## 2020-02-05 ENCOUNTER — Encounter: Payer: Self-pay | Admitting: *Deleted

## 2020-02-05 DIAGNOSIS — M869 Osteomyelitis, unspecified: Secondary | ICD-10-CM

## 2020-02-05 LAB — BASIC METABOLIC PANEL
Anion gap: 11 (ref 5–15)
BUN: 23 mg/dL (ref 8–23)
CO2: 26 mmol/L (ref 22–32)
Calcium: 8.9 mg/dL (ref 8.9–10.3)
Chloride: 103 mmol/L (ref 98–111)
Creatinine, Ser: 1.24 mg/dL (ref 0.61–1.24)
GFR calc Af Amer: >60 mL/min (ref >60)
GFR calc non Af Amer: 56 mL/min — ABNORMAL LOW (ref >60)
Glucose, Bld: 126 mg/dL — ABNORMAL HIGH (ref 70–99)
Potassium: 4.3 mmol/L (ref 3.5–5.1)
Sodium: 140 mmol/L (ref 135–145)

## 2020-02-05 LAB — CBC
HCT: 34.1 % — ABNORMAL LOW (ref 39.0–52.0)
Hemoglobin: 11.2 g/dL — ABNORMAL LOW (ref 13.0–17.0)
MCH: 31 pg (ref 26.0–34.0)
MCHC: 32.8 g/dL (ref 30.0–36.0)
MCV: 94.5 fL (ref 80.0–100.0)
Platelets: 97 10*3/uL — ABNORMAL LOW (ref 150–400)
RBC: 3.61 MIL/uL — ABNORMAL LOW (ref 4.22–5.81)
RDW: 15 % (ref 11.5–15.5)
WBC: 6.2 10*3/uL (ref 4.0–10.5)
nRBC: 0 % (ref 0.0–0.2)

## 2020-02-05 MED ORDER — ENSURE PRE-SURGERY PO LIQD
296.0000 mL | Freq: Once | ORAL | Status: DC
  Filled 2020-02-05: qty 296

## 2020-02-05 MED ORDER — CHLORHEXIDINE GLUCONATE 4 % EX LIQD
60.0000 mL | Freq: Once | CUTANEOUS | Status: AC
  Administered 2020-02-06: 4 via TOPICAL
  Filled 2020-02-05: qty 60

## 2020-02-05 MED ORDER — SODIUM CHLORIDE 0.9 % IV BOLUS
500.0000 mL | Freq: Once | INTRAVENOUS | Status: AC
  Administered 2020-02-05: 500 mL via INTRAVENOUS

## 2020-02-05 MED ORDER — CEFAZOLIN SODIUM-DEXTROSE 2-4 GM/100ML-% IV SOLN
2.0000 g | INTRAVENOUS | Status: AC
  Administered 2020-02-06: 12:00:00 2 g via INTRAVENOUS
  Filled 2020-02-05: qty 100

## 2020-02-05 MED ORDER — METOPROLOL TARTRATE 25 MG PO TABS
12.5000 mg | ORAL_TABLET | Freq: Two times a day (BID) | ORAL | Status: DC
  Administered 2020-02-06 – 2020-02-08 (×4): 12.5 mg via ORAL
  Filled 2020-02-05 (×7): qty 1

## 2020-02-05 MED ORDER — SENNOSIDES-DOCUSATE SODIUM 8.6-50 MG PO TABS
1.0000 | ORAL_TABLET | Freq: Every day | ORAL | Status: DC
  Administered 2020-02-05 – 2020-02-08 (×4): 1 via ORAL
  Filled 2020-02-05 (×4): qty 1

## 2020-02-05 MED ORDER — POVIDONE-IODINE 10 % EX SWAB: 2.0000 "application " | Freq: Once | CUTANEOUS | Status: DC

## 2020-02-05 MED ORDER — FERROUS SULFATE 325 (65 FE) MG PO TABS
325.0000 mg | ORAL_TABLET | Freq: Every day | ORAL | Status: DC
  Administered 2020-02-07 – 2020-02-09 (×3): 325 mg via ORAL
  Filled 2020-02-05 (×3): qty 1

## 2020-02-05 MED ORDER — CEFAZOLIN SODIUM-DEXTROSE 2-4 GM/100ML-% IV SOLN: 2.0000 g | INTRAVENOUS | Status: DC

## 2020-02-05 MED ORDER — CHLORHEXIDINE GLUCONATE 4 % EX LIQD: 60.0000 mL | Freq: Once | CUTANEOUS | Status: AC

## 2020-02-05 NOTE — Social Work (Signed)
CSW acknowledging consult for HH/DME/SNF placement. Will follow for therapy recommendations needed to best determine disposition/for insurance authorization.   Paz Fuentes, MSW, LCSW [facility]    

## 2020-02-05 NOTE — Evaluation (Signed)
Occupational Therapy Evaluation Patient Details Name: [name]. MRN: [id number] DOB: 1941/12/08 Today's Date: 02/05/2020    History of Present Illness 79 yo admitted after fall with left femur fx s/p IM nail. Planned Rt 2nd toe amp 2/19. Pt in cervical collar for healing fx. 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 At time of eval, pt completing bed mobility at mod A +2 and sit <> stands with min A +2 with RW. Pt needing therapist foot under his foot to monitor and advance PWB RLE. Pt able to complete functional mobility into bathroom to engage in grooming task at sink with x1 rest break. Recommend CIR for intensive level of therapies to return to prior level of independence and reduce falls. Will continue to follow per POC listed below.    Follow Up Recommendations  CIR;Supervision/Assistance - 24 hour    Equipment Recommendations  Other (comment)(TBD)    Recommendations for Other Services       Precautions / Restrictions Precautions Precautions: Fall Restrictions Weight Bearing Restrictions: Yes RLE Weight Bearing: Partial weight bearing RLE Partial Weight Bearing Percentage or Pounds: 25%      Mobility Bed Mobility Overal bed mobility: Needs Assistance Bed Mobility: Supine to Sit     Supine to sit: Mod assist;+2 for physical assistance;HOB elevated     General bed mobility comments: HOB 30 degrees with assist to move RLE and elevate trunk, increased time and effort  Transfers Overall transfer level: Needs assistance Equipment used: Rolling walker (2 wheeled) Transfers: Sit to/from Stand Sit to Stand: Min assist;+2 physical assistance         General transfer comment: cues for safe hand placement; therapist placed foor under pt foot to monitor PWB    Balance Overall balance assessment: History of  Falls;Needs assistance   Sitting balance-Leahy Scale: Fair Sitting balance - Comments: pt able to sit EOB without assist     Standing balance-Leahy Scale: Fair Standing balance comment: pt able to stand on LLE at sink with and without UE support with TDWB on RLE, RW for gait                           ADL either performed or assessed with clinical judgement   ADL Overall ADL's : Needs assistance/impaired Eating/Feeding: Set up;Sitting   Grooming: Minimal assistance;Cueing for safety;Standing;Oral care Grooming Details (indicate cue type and reason): standing at sink with min A +2; +2 needed to maintain WB precaution with therapist foot under pt foot. Pt needing external support from hanging onto counter Upper Body Bathing: Minimal assistance;Sitting   Lower Body Bathing: Sitting/lateral leans;Sit to/from stand;Maximal assistance   Upper Body Dressing : Moderate assistance;Sitting   Lower Body Dressing: Maximal assistance;Sitting/lateral leans;Sit to/from stand   Toilet Transfer: Minimal assistance;+2 for physical assistance;+2 for safety/equipment;RW   Toileting- Clothing Manipulation and Hygiene: Moderate assistance;Maximal assistance;Sit to/from stand       Functional mobility during ADLs: Minimal assistance;+2 for physical assistance;+2 for safety/equipment;Rolling walker General ADL Comments: pt limited by RLE pain and precautions     Vision Patient Visual Report: No change from baseline       Perception     Praxis      Pertinent Vitals/Pain Pain Assessment: 0-10 Pain Score: 6  Pain Location: right hip Pain Descriptors / Indicators:  Aching;Guarding;Tender Pain Intervention(s): Limited activity within patient's tolerance;Monitored during session;Repositioned;Patient requesting pain meds-RN notified     Hand Dominance Right   Extremity/Trunk Assessment Upper Extremity Assessment Upper Extremity Assessment: Generalized weakness   Lower Extremity  Assessment Lower Extremity Assessment: Defer to PT evaluation   Cervical / Trunk Assessment Cervical / Trunk Assessment: Other exceptions Cervical / Trunk Exceptions: cervical collar, recent fx   Communication Communication Communication: HOH   Cognition Arousal/Alertness: Awake/alert Behavior During Therapy: WFL for tasks assessed/performed Overall Cognitive Status: Within Functional Limits for tasks assessed                                 General Comments: noted hx of cognitive deficits in chart, pt was appropriate throughout session without deficits noted. will continue to assess   General Comments       Exercises     Shoulder Instructions      Home Living Family/patient expects to be discharged to:: Private residence Living Arrangements: Spouse/significant other Available Help at Discharge: Family;Available 24 hours/day Type of Home: House Home Access: Stairs to enter [employer] of Steps: 3 Entrance Stairs-Rails: Right;Left;Can reach both Home Layout: One level     Bathroom Shower/Tub: Walk-in shower;Door   [employer] Toilet: Standard     Home Equipment: Environmental consultant - 2 wheels;Wheelchair - [employer];Shower seat - built in;Cane - single point   Additional Comments: currently in aspen collar from previous cervical injury      Prior Functioning/Environment Level of Independence: Independent with assistive device(s)        Comments: SPC usage, a lot of falls at home        OT Problem List: Decreased strength;Decreased knowledge of use of DME or AE;Decreased range of motion;Decreased knowledge of precautions;Decreased activity tolerance;Impaired balance (sitting and/or standing);Pain      OT Treatment/Interventions: Self-care/ADL training;Therapeutic exercise;Patient/family education;Balance training;Energy conservation;Therapeutic activities;DME and/or AE instruction;Cognitive remediation/compensation    OT Goals(Current  goals can be found in the care plan section) Acute Rehab OT Goals Patient Stated Goal: return to independence OT Goal Formulation: With patient Time For Goal Achievement: 02/19/20 Potential to Achieve Goals: Good  OT Frequency: Min 2X/week   Barriers to D/C:            Co-evaluation PT/OT/SLP Co-Evaluation/Treatment: Yes Reason for Co-Treatment: Complexity of the patient's impairments (multi-system involvement);For patient/therapist safety PT goals addressed during session: Mobility/safety with mobility;Proper use of DME OT goals addressed during session: ADL's and self-care;Proper use of Adaptive equipment and DME      AM-PAC OT "6 Clicks" Daily Activity     Outcome Measure Help from another person eating meals?: None Help from another person taking care of personal grooming?: A Little Help from another person toileting, which includes using toliet, bedpan, or urinal?: A Lot Help from another person bathing (including washing, rinsing, drying)?: A Lot Help from another person to put on and taking off regular upper body clothing?: A Little Help from another person to put on and taking off regular lower body clothing?: A Lot 6 Click Score: 16   End of Session Equipment Utilized During Treatment: Gait belt;Rolling walker Nurse Communication: Mobility status;Patient requests pain meds;Precautions;Weight bearing status  Activity Tolerance: Patient tolerated treatment well Patient left: in chair;with call bell/phone within reach;with chair alarm set  OT Visit Diagnosis: Unsteadiness on feet (R26.81);Other abnormalities of gait and mobility (R26.89);Muscle weakness (generalized) (M62.81);Repeated falls (R29.6);History of falling (Z91.81);Pain Pain - Right/Left: Right Pain -  part of body: Hip;Leg                Time: [id number] OT Time Calculation (min): 42 min Charges:  OT General Charges $OT Visit: 1 Visit OT Evaluation $OT Eval Moderate Complexity: 1 Mod  Zenovia Jarred,  MSOT, OTR/L Acute Rehabilitation Services [facility] Office Number: [phone]  Zenovia Jarred 02/05/2020, 6:05 PM

## 2020-02-05 NOTE — Consult Note (Signed)
ORTHOPAEDIC CONSULTATION  REQUESTING PHYSICIAN: Florencia Reasons, MD  Chief Complaint: Osteomyelitis right foot second toe ulceration  HPI: Jamie G Remi Enloe. is a 79 y.o. male who presents with right intertrochanteric hip fracture as well as ulceration right second toe.  Patient states that he works around the house barefoot he has been dragging his foot and sustained a full-thickness ulcer to the PIP joint right second toe with recent fall and intertrochanteric right hip fracture.  Patient is status post internal fixation of the hip fracture.  Does have a pacemaker with a history of atrial fibrillation.  Past Medical History:  Diagnosis Date  . (HFpEF) heart failure with preserved ejection fraction ([place])    a. 05/2013 Echo: EF 55%, mild LVH, diast dysfxn, Ao sclerosis, mildly dil LA, RV dysfxn (poorly visualized), PASP 77mmHg;  b. 03/2017 Echo: EF 55-60%, no rwma, triv MR, mildly dil RV, mod TR, PASP 40mmHg.  . Atrial fibrillation [facility])    s/p Cox Maze 1/09;  Multaq Rx d/c'd in 2014 due to pulmo fibrosis;  coumadin d/c'd in 2014 due to spontaneous subdural hematoma  . BPH (benign prostatic hyperplasia)   . CAD (coronary artery disease), native coronary artery    a. s/p CABG 12/2007;  b. Myoview 12/2011: EF 66%, no scar or ischemia; normal.  . DM (diabetes mellitus) ([address])   . Hyperlipidemia type II   . Hypertension   . 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 OSA (obstructive sleep apnea)   . Pacemaker    PPM - St. Jude  . Peripheral neuropathy 07/31/2018  . Pulmonary fibrosis ([place])    Multaq d/c'd 7/14  . Subdural hematoma ([place]) 07/2012   spontaneous;  coumadin d/c'd => no longer a candidate for anticoagulation   Past Surgical History:  Procedure Laterality Date  . AMPUTATION Left 05/17/2019   Procedure: AMPUTATION LEFT FOURTH TOE;  Surgeon: Meredith Pel, MD;  Location: [place];  Service: Orthopedics;  Laterality: Left;  . APPENDECTOMY    .  CHOLECYSTECTOMY    . CORONARY ARTERY BYPASS GRAFT     x3 (left internal mammary artery to distal left anterior descending coronary artery, saphenous vain graft to second circumflex marginal branch, saphenous vain graft to posterior descending coronary artery, endoscopic saphenous vain harvest from right thigh) and modified Cox - Maze IV procedure.  [name],MD. Electronically signed CHO/MEDQ D: 01/09/2008/ JOB: [id number] cc:  [name]  . [name]  07/30/2012   Procedure: CRANIOTOMY HEMATOMA EVACUATION SUBDURAL;  Surgeon: [name], MD;  Location: [place] [facility];  Service: Neurosurgery;  Laterality: Right;  Right craniotomy for evacuation of subdural hematoma  . FOOT SURGERY    . HERNIA REPAIR    . ORCHIECTOMY     Left  /  testicular cancer  . PACEMAKER PLACEMENT     PPM - St. Jude  . PPM GENERATOR CHANGEOUT N/A 06/25/2019   Procedure: PPM GENERATOR CHANGEOUT;  Surgeon: Evans Lance, MD;  Location: [place] CV LAB;  Service: Cardiovascular;  Laterality: N/A;   Social History   Socioeconomic History  . Marital status: Married    Spouse name: [name]  . Number of children: 2  . Years of education: Not on file  . Highest education level: Not on file  Occupational History  . Occupation: Retired- IT trainer  Tobacco Use  . Smoking status: Former Smoker    Packs/day: 2.00    Years: 20.00  Pack years: 40.00    Types: Cigarettes    Quit date: 02/21/1991    Years since quitting: 28.9  . Smokeless tobacco: Never Used  Substance and Sexual Activity  . Alcohol use: No    Alcohol/week: 0.0 standard drinks  . Drug use: No  . Sexual activity: Not Currently  Other Topics Concern  . Not on file  Social History Narrative   Lives with wife   Right handed    Married with two children.     He is a Engineer, structural.     Social Determinants of Health   Financial Resource Strain:   . Difficulty of Paying Living Expenses: Not on file  Food Insecurity:   . Worried About  Charity fundraiser in the Last Year: Not on file  . Ran Out of Food in the Last Year: Not on file  Transportation Needs:   . Lack of Transportation (Medical): Not on file  . Lack of Transportation (Non-Medical): Not on file  Physical Activity:   . Days of Exercise per Week: Not on file  . Minutes of Exercise per Session: Not on file  Stress:   . Feeling of Stress : Not on file  Social Connections:   . Frequency of Communication with Friends and Family: Not on file  . Frequency of Social Gatherings with Friends and Family: Not on file  . Attends Religious Services: Not on file  . Active Member of Clubs or Organizations: Not on file  . Attends Archivist Meetings: Not on file  . Marital Status: Not on file   Family History  Problem Relation Age of Onset  . Heart disease Father   . Heart attack Father   . Heart failure Father   . Heart disease Mother   . Alzheimer's disease Mother   . Dementia Mother    - negative except otherwise stated in the family history section Allergies  Allergen Reactions  . Lipitor [Atorvastatin] Other (See Comments)    Stiff joints  . 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 Warfarin And Related Other (See Comments)    Stiff joints  . Enbrel [Etanercept] Itching   Prior to Admission medications   Medication Sig Start Date End Date Taking? Authorizing Provider  allopurinol (ZYLOPRIM) 300 MG tablet Take 300 mg by mouth daily.  06/21/16  Yes [provider]  furosemide (LASIX) 40 MG tablet Take 60 mg by mouth 2 (two) times daily.    Yes [provider]  metoprolol tartrate (LOPRESSOR) 25 MG tablet Take 1 tablet (25 mg total) by mouth 2 (two) times daily. 01/07/20  Yes Lendon Colonel, NP  mirabegron ER (MYRBETRIQ) 50 MG TB24 tablet Take 1 tablet (50 mg total) by mouth daily. 10/06/19  Yes Kathrynn Ducking, MD  pantoprazole (PROTONIX) 40 MG tablet Take 40 mg by mouth daily. 02/22/17  Yes [provider]  potassium chloride SA (K-DUR) 20 MEQ tablet Take 1 tablet (20 mEq total) by mouth daily. 06/04/19  Yes Love, Ivan Anchors, PA-C  rosuvastatin (CRESTOR) 40 MG tablet Take 1 tablet (40 mg total) by mouth daily. Patient taking differently: Take 40 mg by mouth every evening.  12/25/18  Yes [name], MD  sertraline (ZOLOFT) 100 MG tablet Take 200 mg by mouth daily.    Yes [provider]  torsemide (DEMADEX) 20 MG tablet Take 1 tablet (20 mg total) by mouth 2 (two) times daily.  12/10/19  Yes [name], MD  acetaminophen (TYLENOL) 325 MG tablet Take 1-2 tablets (325-650 mg total) by mouth every 4 (four) hours as needed for mild pain. 05/28/19   Love, Ivan Anchors, PA-C  HYDROcodone-acetaminophen (NORCO) 5-325 MG tablet Take 1 tablet by mouth every 6 (six) hours as needed for moderate pain or severe pain. 02/04/20   Netta Cedars, MD  methocarbamol (ROBAXIN) 500 MG tablet Take 1 tablet (500 mg total) by mouth every 8 (eight) hours as needed. 02/04/20   Netta Cedars, MD  tamsulosin (FLOMAX) 0.4 MG CAPS capsule Take 0.4 mg by mouth at bedtime.     [provider]   DG Chest 1 View  Result Date: 02/04/2020 CLINICAL DATA:  Fall EXAM: CHEST  1 VIEW COMPARISON:  05/15/2019 FINDINGS: The heart size and mediastinal contours are within normal limits. Both lungs are clear. The visualized skeletal structures are unremarkable. Median sternotomy and left chest wall pacemaker. IMPRESSION: No active disease. Electronically Signed   By: Ulyses Jarred M.D.   On: 02/04/2020 00:37   DG Elbow 2 Views Right  Result Date: 02/04/2020 CLINICAL DATA:  Right elbow and hip pain after fall EXAM: RIGHT ELBOW - 2 VIEW COMPARISON:  None. FINDINGS: There is no evidence of fracture, dislocation, or joint effusion. There is no evidence of arthropathy or other focal bone abnormality. Soft tissues are unremarkable. IMPRESSION: Negative. Electronically Signed   By: Ulyses Jarred M.D.   On: 02/04/2020  00:29   CT Head Wo Contrast  Result Date: 02/04/2020 CLINICAL DATA:  Fall EXAM: CT HEAD WITHOUT CONTRAST CT CERVICAL SPINE WITHOUT CONTRAST TECHNIQUE: Multidetector CT imaging of the head and cervical spine was performed following the standard protocol without intravenous contrast. Multiplanar CT image reconstructions of the cervical spine were also generated. COMPARISON:  None. FINDINGS: CT HEAD FINDINGS Brain: There is no mass, hemorrhage or extra-axial collection. The size and configuration of the ventricles and extra-axial CSF spaces are normal. Areas of hypoattenuation of the deep gray nuclei and confluent periventricular white matter hypodensity, consistent with chronic small vessel disease. 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 Vascular: No abnormal hyperdensity of the major intracranial arteries or dural venous sinuses. No intracranial atherosclerosis. Unchanged calcification along the course of the left M2 segment. Skull: Remote right-sided craniotomy. Sinuses/Orbits: No fluid levels or advanced mucosal thickening of the visualized paranasal sinuses. No mastoid or middle ear effusion. The orbits are normal. CT CERVICAL SPINE FINDINGS Alignment: No static subluxation. Facets are aligned. Occipital condyles are normally positioned. Skull base and vertebrae: No acute fracture. Soft tissues and spinal canal: No prevertebral fluid or swelling. No visible canal hematoma. Disc levels: No bony spinal canal stenosis. Multilevel mild vertebral body height loss, particularly at C3 and C4. Facet hypertrophy is worst at right C3-4 and left C4-5. Upper chest: No pneumothorax, pulmonary nodule or pleural effusion. Other: Normal visualized paraspinal cervical soft tissues. IMPRESSION: 1. Chronic ischemic microangiopathy without acute intracranial abnormality. 2. No acute fracture or static subluxation of the cervical spine. Electronically Signed   By: Ulyses Jarred M.D.   On: 02/04/2020 00:54    CT Cervical Spine Wo Contrast  Result Date: 02/04/2020 CLINICAL DATA:  Fall EXAM: CT HEAD WITHOUT CONTRAST CT CERVICAL SPINE WITHOUT CONTRAST TECHNIQUE: Multidetector CT imaging of the head and cervical spine was performed following the standard protocol without intravenous contrast. Multiplanar CT image reconstructions of the cervical spine were also generated. COMPARISON:  None. FINDINGS: CT HEAD FINDINGS Brain: There is no  mass, hemorrhage or extra-axial collection. The size and configuration of the ventricles and extra-axial CSF spaces are normal. Areas of hypoattenuation of the deep gray nuclei and confluent periventricular white matter hypodensity, consistent with chronic small vessel disease. 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 Vascular: No abnormal hyperdensity of the major intracranial arteries or dural venous sinuses. No intracranial atherosclerosis. Unchanged calcification along the course of the left M2 segment. Skull: Remote right-sided craniotomy. Sinuses/Orbits: No fluid levels or advanced mucosal thickening of the visualized paranasal sinuses. No mastoid or middle ear effusion. The orbits are normal. CT CERVICAL SPINE FINDINGS Alignment: No static subluxation. Facets are aligned. Occipital condyles are normally positioned. Skull base and vertebrae: No acute fracture. Soft tissues and spinal canal: No prevertebral fluid or swelling. No visible canal hematoma. Disc levels: No bony spinal canal stenosis. Multilevel mild vertebral body height loss, particularly at C3 and C4. Facet hypertrophy is worst at right C3-4 and left C4-5. Upper chest: No pneumothorax, pulmonary nodule or pleural effusion. Other: Normal visualized paraspinal cervical soft tissues. IMPRESSION: 1. Chronic ischemic microangiopathy without acute intracranial abnormality. 2. No acute fracture or static subluxation of the cervical spine. Electronically Signed   By: Ulyses Jarred M.D.   On: 02/04/2020 00:54    DG Foot 2 Views Right  Result Date: 02/04/2020 CLINICAL DATA:  First and second toe infection for 1 week, initial encounter EXAM: RIGHT FOOT - 2 VIEW COMPARISON:  None. FINDINGS: Degenerative changes of the tarsal bones are noted. No definitive bony erosive changes are seen to suggest osteomyelitis. Mild soft tissue swelling is seen. No fractures are noted. IMPRESSION: No findings to suggest osteomyelitis. Electronically Signed   By: Inez Catalina M.D.   On: 02/04/2020 16:36   DG C-Arm 1-60 Min  Result Date: 02/04/2020 CLINICAL DATA:  ORIF proximal right femur fracture EXAM: DG C-ARM 1-60 MIN; RIGHT FEMUR 2 VIEWS COMPARISON:  02/04/2020 right hip radiographs FLUOROSCOPY TIME:  Fluoroscopy Time:  1 minutes 0 seconds Number of Acquired Spot Images: 4 FINDINGS: Multiple nondiagnostic spot fluoroscopic intraoperative right hip radiographs demonstrate transfixation of intertrochanteric proximal right femur fracture by intramedullary rod with interlocking proximal right femoral neck pin and distal interlocking screw. IMPRESSION: Intraoperative fluoroscopic guidance for ORIF intertrochanteric proximal right femur fracture. Electronically Signed   By: Ilona Sorrel M.D.   On: 02/04/2020 19:25   DG Hip Unilat W or Wo Pelvis 2-3 Views Right  Result Date: 02/04/2020 CLINICAL DATA:  Right elbow and hip pain after fall EXAM: DG HIP (WITH OR WITHOUT PELVIS) 2-3V RIGHT COMPARISON:  None. FINDINGS: Nondisplaced intertrochanteric fracture, best seen on cross-table lateral view. No dislocation. No pelvic fracture. IMPRESSION: Nondisplaced right intertrochanteric fracture. Electronically Signed   By: Ulyses Jarred M.D.   On: 02/04/2020 00:34   DG FEMUR, MIN 2 VIEWS RIGHT  Result Date: 02/04/2020 CLINICAL DATA:  ORIF proximal right femur fracture EXAM: DG C-ARM 1-60 MIN; RIGHT FEMUR 2 VIEWS COMPARISON:  02/04/2020 right hip radiographs FLUOROSCOPY TIME:  Fluoroscopy Time:  1 minutes 0 seconds Number of Acquired Spot  Images: 4 FINDINGS: Multiple nondiagnostic spot fluoroscopic intraoperative right hip radiographs demonstrate transfixation of intertrochanteric proximal right femur fracture by intramedullary rod with interlocking proximal right femoral neck pin and distal interlocking screw. IMPRESSION: Intraoperative fluoroscopic guidance for ORIF intertrochanteric proximal right femur fracture. Electronically Signed   By: Ilona Sorrel M.D.   On: 02/04/2020 19:25   - pertinent xrays, CT, MRI studies were reviewed and independently interpreted  Positive ROS: All other  systems have been reviewed and were otherwise negative with the exception of those mentioned in the HPI and as above.  Physical Exam: General: Alert, no acute distress Psychiatric: Patient is competent for consent with normal mood and affect Lymphatic: No axillary or cervical lymphadenopathy Cardiovascular: No pedal edema Respiratory: No cyanosis, no use of accessory musculature GI: No organomegaly, abdomen is soft and non-tender    Images:  @ENCIMAGES @  Labs:  Lab Results  Component Value Date   HGBA1C 5.6 05/21/2019   HGBA1C 5.7 (H) 07/30/2012   HGBA1C  01/07/2008    5.3 (NOTE)   The ADA recommends the following therapeutic goals for glycemic   control related to Hgb A1C measurement:   Goal of Therapy:   < 7.0% Hgb A1C   Action Suggested:  > 8.0% Hgb A1C   Ref:  Diabetes Care, 22, Suppl. 1, 1999   ESRSEDRATE 40 (H) 05/01/2019   ESRSEDRATE 45 (H) 12/16/2018   ESRSEDRATE 18 06/18/2018   CRP <0.5 06/16/2013   LABURIC 3.9 12/16/2018   REPTSTATUS 05/20/2019 FINAL 05/15/2019   CULT  05/15/2019    NO GROWTH 5 DAYS Performed at [facility], [place] [address] [place], [place] [address]     Lab Results  Component Value Date   ALBUMIN 3.8 09/25/2019   ALBUMIN 3.5 05/27/2019   ALBUMIN 3.2 (L) 05/26/2019   LABURIC 3.9 12/16/2018    Neurologic: Patient does not have protective sensation bilateral lower  extremities.   MUSCULOSKELETAL:   Skin: Examination patient has a full-thickness ulcer right foot second toe PIP joint with exposed bone.  There is no sausage digit swelling no ascending cellulitis.  Patient has a very faint palpable dorsalis pedis pulse.  Ankle-brachial indices last year showed triphasic flow with normal ABIs.  Assessment: Assessment: Osteomyelitis and ulceration right foot second toe PIP joint.  Plan: Plan: We will plan for amputation right foot second toe tomorrow.  Risk and benefits were discussed including protected weightbearing for several weeks for incision healing.  Patient states he understands wished to proceed at this time.  Thank you for the consult and the opportunity to see Mr. Elise Benne, MD [employer] [phone] 8:25 AM

## 2020-02-05 NOTE — Progress Notes (Signed)
Rehab Admissions Coordinator Note:  Patient was screened by Michel Santee for appropriateness for an Inpatient Acute Rehab Consult.  At this time, we are recommending Inpatient Rehab consult.  Note plan for toe amputation in the next few days.    Michel Santee 02/05/2020, 1:30 PM  I can be reached at [phone].

## 2020-02-05 NOTE — Progress Notes (Signed)
PROGRESS NOTE  [name]. OTL:[id number] DOB: 06/06/1941 DOA: 02/03/2020 PCP: [name], MD  HPI/Recap of past 24 hours:  POD#1, c/o hip pain when trying to stand up from chair,  Blood pressure low normal, betablocker held this am, he does not appear to be symptomatic from it  Assessment/Plan: Principal Problem:   Closed right hip fracture, initial encounter 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 Saintclair Halsted.  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 Denies chest pain  chronic right sided CHF,  Per his cardiologist Dr [name] "His right-sided congestive heart failure is felt secondary to a combination of pulmonary venous hypertension,  pulmonary fibrosis and sleep apnea. Currently euvolemic, no edema, cxr no acute findings , bp low normal, continue hold diuretic, 500cc fluids bolusx1 on 2/18 Home meds demadex held due to npo status and anticipating toe surgery, plan to resume diuretics post op Close monitor volume status  severe tricuspid regurgitation- Per cardiology "likely secondary to right ventricular enlargement related to pulmonary hypertension versus trauma from pacemaker.  Patient is not a surgical candidate given multiple comorbidities."  H/o mild to moderate pulmonary fibrosis on CT chest in 07/2019. Pulmonary function test November 2020 normal.   chronic Anemia, thrombocytopenia  - he is followed  By Dr [name] - Type and screen was performed in ED -monitor CBC  CKD stage IIIa  - SCr is 1.37 in ED, similar to priors  - Renally-dose medications, monitor   OSA on nightly cpap chronically  DVT Prophylaxis: scd's  Code Status: full  Family Communication: wife Jade Burright  [phone] with permission  Disposition Plan:    Patient came from:        home                                                                                                  Anticipated d/c place: Pt  recommended CIR, wife states patient greatly benefited from CIR last year, wife if happy that CIR is recommended again this time  Barriers to d/c OR conditions which need to be met to effect a safe d/c: needs toe  ortho clearance/CIR eval   Consultants:  Dr [name] ( right hip)  Dr [name] (right toe)  Procedures: INTRAMEDULLARY (IM) NAIL INTERTROCHANTRIC on 2/17 by Dr [name] Right toe amputation planned on 2/19 by Dr [name]  Antibiotics:  Perioperative per ortho   Objective: BP (!) 91/55 (BP Location: Left Arm)   Pulse (!) 111   Temp 98.5 F (36.9 C) (Oral)   Resp 17   Ht 5' 7.8" (1.722 m)   Wt 81.1 kg   SpO2 93%   BMI 27.35 kg/m   Intake/Output Summary (Last 24 hours) at 02/05/2020 0254 Last data filed at  02/05/2020 0500 Gross per 24 hour  Intake 320 ml  Output 1150 ml  Net -830 ml   Filed Weights   02/04/20 0950  Weight: 81.1 kg    Exam: Patient is examined daily including today on 02/05/2020, exams remain the same as of yesterday except that has changed    General:  NAD, wearing a neck collar, report has been on chronically at home  Cardiovascular: IRRR  Respiratory: CTABL  Abdomen: Soft/ND/NT, positive BS  Musculoskeletal: No Edema, skin tear to right elvow,  right hip post op changes,  right foot toe findings, see pic below  Neuro: alert, oriented to place, person and situation, slightly confused about the year, able to redirected, (per wife, this is baseline )      Data Reviewed: Basic Metabolic Panel: Recent Labs  Lab 02/03/20 1211 02/04/20 0040 02/04/20 0418 02/05/20 0203  NA 142 139 141 140  K 4.5 4.4 3.9 4.3  CL 102 103 101 103  CO2 '27 28 27 26  ' GLUCOSE 81 102* 104* 126*  BUN 23 23 24* 23  CREATININE 1.18 1.37* 1.30* 1.24  CALCIUM 9.3 9.3 9.4 8.9   Liver Function Tests: No results for input(s): AST, ALT, ALKPHOS, BILITOT, PROT, ALBUMIN in the last 168 hours. No results for input(s): LIPASE, AMYLASE in the last 168 hours. No results for input(s): AMMONIA in the last 168 hours. CBC: Recent Labs  Lab 02/04/20 0040 02/04/20 0418 02/05/20 0203  WBC 5.3 6.1 6.2  HGB 12.0* 11.2* 11.2*  HCT 37.5* 34.0* 34.1*  MCV 96.2 93.4 94.5  PLT 125* 111* 97*   Cardiac Enzymes:   No results for input(s): CKTOTAL, CKMB, CKMBINDEX, TROPONINI in the last 168 hours. BNP (last 3 results) Recent Labs    05/16/19 0609  BNP 289.1*    ProBNP (last 3 results) No results for input(s): PROBNP in the last 8760 hours.  CBG: Recent Labs  Lab 02/04/20 1751  GLUCAP 98    Recent Results (from the past 240 hour(s))  SARS CORONAVIRUS 2 (TAT 6-24 HRS) Nasopharyngeal Nasopharyngeal Swab     Status: None   Collection Time: 01/26/20  1:23 PM   Specimen: Nasopharyngeal  Swab  Result Value Ref Range Status   SARS Coronavirus 2 NEGATIVE NEGATIVE Final    Comment: (NOTE) SARS-CoV-2 target nucleic acids are NOT DETECTED. The SARS-CoV-2 RNA is generally detectable in upper and lower respiratory specimens during the acute phase of infection. Negative results do not preclude SARS-CoV-2 infection, do not rule out co-infections with other pathogens, and should not be used as the sole basis for treatment or other patient management decisions. Negative results must be  combined with clinical observations, patient history, and epidemiological information. The expected result is Negative. Fact Sheet for Patients: SugarRoll.be Fact Sheet for Healthcare Providers: https://www.woods-mathews.com/ This test is not yet approved or cleared by the [place] FDA and  has been authorized for detection and/or diagnosis of SARS-CoV-2 by FDA under an Emergency Use Authorization (EUA). This EUA will remain  in effect (meaning this test can be used) for the duration of the COVID-19 declaration under Section 56 4(b)(1) of the Act, 21 U.S.C. section 360bbb-3(b)(1), unless the authorization is terminated or revoked sooner. Performed at [facility], [place] [address], [place], [place] [address]   Respiratory Panel by RT PCR (Flu A&B, Covid) - Nasopharyngeal Swab     Status: None   Collection Time: 02/04/20 12:44 AM   Specimen: Nasopharyngeal Swab  Result Value Ref Range Status   SARS Coronavirus 2 by RT PCR NEGATIVE NEGATIVE Final    Comment: (NOTE) SARS-CoV-2 target nucleic acids are NOT DETECTED. The SARS-CoV-2 RNA is generally detectable in upper respiratoy specimens during the acute phase of infection. The lowest concentration of SARS-CoV-2 viral copies this assay can detect is 131 copies/mL. A negative result does not preclude SARS-Cov-2 infection and should not be used as the sole basis for treatment or other patient  management decisions. A negative result may occur with  improper specimen collection/handling, submission of specimen other than nasopharyngeal swab, presence of viral mutation(s) within the areas targeted by this assay, and inadequate number of viral copies (<131 copies/mL). A negative result must be combined with clinical observations, patient history, and epidemiological information. The expected result is Negative. Fact Sheet for Patients:  PinkCheek.be Fact Sheet for Healthcare Providers:  GravelBags.it This test is not yet ap proved or cleared by the [place] FDA and  has been authorized for detection and/or diagnosis of SARS-CoV-2 by FDA under an Emergency Use Authorization (EUA). This EUA will remain  in effect (meaning this test can be used) for the duration of the COVID-19 declaration under Section 564(b)(1) of the Act, 21 U.S.C. section 360bbb-3(b)(1), unless the authorization is terminated or revoked sooner.    Influenza A by PCR NEGATIVE NEGATIVE Final   Influenza B by PCR NEGATIVE NEGATIVE Final    Comment: (NOTE) The Xpert Xpress SARS-CoV-2/FLU/RSV assay is intended as an aid in  the diagnosis of influenza from Nasopharyngeal swab specimens and  should not be used as a sole basis for treatment. Nasal washings and  aspirates are unacceptable for Xpert Xpress SARS-CoV-2/FLU/RSV  testing. Fact Sheet for Patients: PinkCheek.be Fact Sheet for Healthcare Providers: GravelBags.it This test is not yet approved or cleared by the [place] FDA and  has been authorized for detection and/or diagnosis of SARS-CoV-2 by  FDA under an Emergency Use Authorization (EUA). This EUA will remain  in effect (meaning this test can be used) for the duration of the  Covid-19 declaration under Section 564(b)(1) of the Act, 21  U.S.C. section 360bbb-3(b)(1), unless the  authorization is  terminated or revoked. Performed at [facility], [place] [address], [place], [place] [address]   Surgical pcr screen     Status: Abnormal   Collection Time: 02/04/20 10:03 AM   Specimen: Nasal Mucosa; Nasal Swab  Result Value Ref Range Status   MRSA, PCR NEGATIVE NEGATIVE Final   Staphylococcus aureus POSITIVE (A) NEGATIVE Final    Comment: (NOTE) The Xpert SA Assay (FDA approved for NASAL specimens in patients 67 years of age and older), is one  component of a comprehensive surveillance program. It is not intended to diagnose infection nor to guide or monitor treatment. Performed at [facility], [place] [address] [place], [place] [address]      Studies: DG Foot 2 Views Right  Result Date: 02/04/2020 CLINICAL DATA:  First and second toe infection for 1 week, initial encounter EXAM: RIGHT FOOT - 2 VIEW COMPARISON:  None. FINDINGS: Degenerative changes of the tarsal bones are noted. No definitive bony erosive changes are seen to suggest osteomyelitis. Mild soft tissue swelling is seen. No fractures are noted. IMPRESSION: No findings to suggest osteomyelitis. Electronically Signed   By: Inez Catalina M.D.   On: 02/04/2020 16:36   DG C-Arm 1-60 Min  Result Date: 02/04/2020 CLINICAL DATA:  ORIF proximal right femur fracture EXAM: DG C-ARM 1-60 MIN; RIGHT FEMUR 2 VIEWS COMPARISON:  02/04/2020 right hip radiographs FLUOROSCOPY TIME:  Fluoroscopy Time:  1 minutes 0 seconds Number of Acquired Spot Images: 4 FINDINGS: Multiple nondiagnostic spot fluoroscopic intraoperative right hip radiographs demonstrate transfixation of intertrochanteric proximal right femur fracture by intramedullary rod with interlocking proximal right femoral neck pin and distal interlocking screw. IMPRESSION: Intraoperative fluoroscopic guidance for ORIF intertrochanteric proximal right femur fracture. Electronically Signed   By: Ilona Sorrel M.D.   On: 02/04/2020 19:25   DG FEMUR, MIN 2 VIEWS  RIGHT  Result Date: 02/04/2020 CLINICAL DATA:  ORIF proximal right femur fracture EXAM: DG C-ARM 1-60 MIN; RIGHT FEMUR 2 VIEWS COMPARISON:  02/04/2020 right hip radiographs FLUOROSCOPY TIME:  Fluoroscopy Time:  1 minutes 0 seconds Number of Acquired Spot Images: 4 FINDINGS: Multiple nondiagnostic spot fluoroscopic intraoperative right hip radiographs demonstrate transfixation of intertrochanteric proximal right femur fracture by intramedullary rod with interlocking proximal right femoral neck pin and distal interlocking screw. IMPRESSION: Intraoperative fluoroscopic guidance for ORIF intertrochanteric proximal right femur fracture. Electronically Signed   By: Ilona Sorrel M.D.   On: 02/04/2020 19:25    Scheduled Meds: . chlorhexidine  15 mL Mouth Rinse BID  . Chlorhexidine Gluconate Cloth  6 each Topical Daily  . docusate sodium  100 mg Oral BID  . enoxaparin (LOVENOX) injection  40 mg Subcutaneous Q24H  . feeding supplement (ENSURE ENLIVE)  237 mL Oral QHS  . ferrous sulfate  325 mg Oral TID PC  . mouth rinse  15 mL Mouth Rinse q12n4p  . metoprolol tartrate  25 mg Oral BID  . mirabegron ER  50 mg Oral Daily  . multivitamin with minerals  1 tablet Oral Daily  . mupirocin ointment  1 application Nasal BID  . nutrition supplement (JUVEN)  1 packet Oral BID BM  . pantoprazole  40 mg Oral Daily  . rosuvastatin  40 mg Oral QPM  . sertraline  200 mg Oral Daily  . tamsulosin  0.4 mg Oral QHS    Continuous Infusions: . sodium chloride 50 mL/hr at 02/04/20 1931  . lactated ringers 10 mL/hr at 02/04/20 1800  . methocarbamol (ROBAXIN) IV       Time spent: 1mns I have personally reviewed and interpreted on  02/05/2020 daily labs, tele strips, imagings as discussed above under date review session and assessment and plans.  I reviewed all nursing notes, pharmacy notes, consultant notes,  vitals, pertinent old records  I have discussed plan of care as described above with RN , patient and  family on 02/05/2020   FFlorencia ReasonsMD, PhD, FACP  [facility]  Available via Epic secure chat 7am-7pm for nonurgent issues Please  page for urgent issues, pager number available through [email or web address] .   02/05/2020, 8:39 AM  LOS: 1 day

## 2020-02-05 NOTE — Evaluation (Addendum)
Physical Therapy Evaluation Patient Details Name: [name]. MRN: [id number] DOB: 1941/02/23 Today's Date: 02/05/2020   History of Present Illness  79 yo admitted after fall with left femur fx s/p IM nail. Planned Rt 2nd toe amp 2/19. Pt in cervical collar for healing fx. 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 Pt had not received pain medication since 5am due to hypotension and BP sitting 119/88 with HR 134 with activity and pt receiving medication end of session. Pt with decreased strength, ROM, transfers, gait and function who will benefit from acute therapy to maximize mobility, gait, strength and function to decrease burden of care.     Follow Up Recommendations CIR;Supervision for mobility/OOB    Equipment Recommendations  None recommended by PT    Recommendations for Other Services Rehab consult     Precautions / Restrictions Precautions Precautions: Fall Restrictions RLE Weight Bearing: Partial weight bearing RLE Partial Weight Bearing Percentage or Pounds: 25%      Mobility  Bed Mobility Overal bed mobility: Needs Assistance Bed Mobility: Supine to Sit     Supine to sit: Mod assist;+2 for physical assistance;HOB elevated     General bed mobility comments: HOB 30 degrees with assist to move RLE and elevate trunk, increased time and effort  Transfers Overall transfer level: Needs assistance   Transfers: Sit to/from Stand Sit to Stand: Min assist;+2 physical assistance         General transfer comment: cues for sequence, hand placement and pt foot on P.T. foot to monitor PWB. Pt stood from bed and recliner at sink  Ambulation/Gait Ambulation/Gait assistance: Min assist;+2 safety/equipment Gait Distance (Feet): 8 Feet Assistive device: Rolling walker (2 wheeled) Gait Pattern/deviations: Step-to pattern;Decreased stride length   Gait velocity  interpretation: <1.8 ft/sec, indicate of risk for recurrent falls General Gait Details: cues for posture, assist to advance RLE with pt foot maintained on P.T. foot to adhere to [facility] and advance limb. Close chair follow limited by fatigue and pain  Stairs            Wheelchair Mobility    Modified Rankin (Stroke Patients Only)       Balance Overall balance assessment: History of Falls;Needs assistance   Sitting balance-Leahy Scale: Fair Sitting balance - Comments: pt able to sit EOB without assist     Standing balance-Leahy Scale: Fair Standing balance comment: pt able to stand on LLE at sink with and without UE support with TDWB on RLE, RW for gait                             Pertinent Vitals/Pain Pain Assessment: 0-10 Pain Score: 6  Pain Location: right hip Pain Descriptors / Indicators: Aching;Guarding;Tender Pain Intervention(s): Limited activity within patient's tolerance;Monitored during session;Repositioned;Patient requesting pain meds-RN notified    Home Living Family/patient expects to be discharged to:: Private residence Living Arrangements: Spouse/significant other Available Help at Discharge: Family;Available 24 hours/day Type of Home: House Home Access: Stairs to enter Entrance Stairs-Rails: Right;Left;Can reach both Entrance Stairs-Number of Steps: 3 Home Layout: One level Home Equipment: Walker - 2 wheels;Wheelchair - [employer];Shower seat - built in;Cane - single point Additional Comments: currently in aspen collar from previous c injury    Prior Function Level of Independence: Independent with assistive device(s)         Comments: SPC usage, a lot of  falls at home     Hand Dominance        Extremity/Trunk Assessment   Upper Extremity Assessment Upper Extremity Assessment: Defer to OT evaluation    Lower Extremity Assessment Lower Extremity Assessment: RLE deficits/detail RLE Deficits / Details: decreased ROM  and strength post op    Cervical / Trunk Assessment Cervical / Trunk Assessment: Other exceptions Cervical / Trunk Exceptions: cervical collar, recent fx  Communication   Communication: HOH  Cognition Arousal/Alertness: Awake/alert Behavior During Therapy: WFL for tasks assessed/performed Overall Cognitive Status: Within Functional Limits for tasks assessed                                        General Comments      Exercises General Exercises - Lower Extremity Heel Slides: AAROM;Right;Supine;5 reps   Assessment/Plan    PT Assessment Patient needs continued PT services  PT Problem List Decreased strength;Decreased mobility;Decreased activity tolerance;Decreased balance;Decreased knowledge of use of DME;Pain;Decreased range of motion       PT Treatment Interventions DME instruction;Therapeutic exercise;Gait training;Balance training;Functional mobility training;Therapeutic activities;Patient/family education    PT Goals (Current goals can be found in the Care Plan section)  Acute Rehab PT Goals Patient Stated Goal: be able to walk and return home to fix the hole in the wall PT Goal Formulation: With patient Time For Goal Achievement: 02/19/20 Potential to Achieve Goals: Good    Frequency Min 5X/week   Barriers to discharge Decreased caregiver support      Co-evaluation PT/OT/SLP Co-Evaluation/Treatment: Yes Reason for Co-Treatment: Complexity of the patient's impairments (multi-system involvement);For patient/therapist safety PT goals addressed during session: Mobility/safety with mobility;Proper use of DME         AM-PAC PT "6 Clicks" Mobility  Outcome Measure Help needed turning from your back to your side while in a flat bed without using bedrails?: A Lot Help needed moving from lying on your back to sitting on the side of a flat bed without using bedrails?: A Lot Help needed moving to and from a bed to a chair (including a wheelchair)?: A  Little Help needed standing up from a chair using your arms (e.g., wheelchair or bedside chair)?: A Little Help needed to walk in hospital room?: A Lot Help needed climbing 3-5 steps with a railing? : Total 6 Click Score: 13    End of Session Equipment Utilized During Treatment: Gait belt Activity Tolerance: Patient tolerated treatment well Patient left: in chair;with call bell/phone within reach;with chair alarm set Nurse Communication: Mobility status;Precautions;Weight bearing status PT Visit Diagnosis: Other abnormalities of gait and mobility (R26.89);Difficulty in walking, not elsewhere classified (R26.2)    Time: 1107-1150 PT Time Calculation (min) (ACUTE ONLY): 43 min   Charges:   PT Evaluation $PT Eval Moderate Complexity: 1 Mod          [place], PT Acute Rehabilitation Services Pager: [phone] Office: [phone]   Sandy Salaam Soleia Badolato 02/05/2020, 12:36 PM

## 2020-02-05 NOTE — Anesthesia Postprocedure Evaluation (Signed)
Anesthesia Post Note  Patient: [name].  Procedure(s) Performed: INTRAMEDULLARY (IM) NAIL INTERTROCHANTRIC (Right )     Patient location during evaluation: PACU Anesthesia Type: General Level of consciousness: awake and alert Pain management: pain level controlled Vital Signs Assessment: post-procedure vital signs reviewed and stable Respiratory status: spontaneous breathing, nonlabored ventilation, respiratory function stable and patient connected to nasal cannula oxygen Cardiovascular status: blood pressure returned to baseline and stable Postop Assessment: no apparent nausea or vomiting Anesthetic complications: no    Last Vitals:  Vitals:   02/05/20 0815 02/05/20 1027  BP: (!) 91/55 (!) 84/53  Pulse: (!) 111 (!) 115  Resp: 17   Temp: 36.9 C   SpO2: 93% 94%    Last Pain:  Vitals:   02/05/20 0815  TempSrc: Oral  PainSc:                  Audry Pili

## 2020-02-05 NOTE — Progress Notes (Signed)
Orthopedics Progress Note  Subjective: Right hip is feeling better, some thigh pain  Objective:  Vitals:   02/05/20 0404 02/05/20 0815  BP: 104/69 (!) 91/55  Pulse: (!) 104 (!) 111  Resp:  17  Temp:  98.5 F (36.9 C)  SpO2:  93%    General: Awake and alert  Musculoskeletal: Right hip dressings CDI, no pain with ankle pumps Neurovascularly intact  Lab Results  Component Value Date   WBC 6.2 02/05/2020   HGB 11.2 (L) 02/05/2020   HCT 34.1 (L) 02/05/2020   MCV 94.5 02/05/2020   PLT 97 (L) 02/05/2020       Component Value Date/Time   NA 140 02/05/2020 0203   NA 142 02/03/2020 1211   NA 141 02/23/2017 1237   K 4.3 02/05/2020 0203   K 3.8 02/23/2017 1237   CL 103 02/05/2020 0203   CO2 26 02/05/2020 0203   CO2 27 02/23/2017 1237   GLUCOSE 126 (H) 02/05/2020 0203   GLUCOSE 113 02/23/2017 1237   BUN 23 02/05/2020 0203   BUN 23 02/03/2020 1211   BUN 18.3 02/23/2017 1237   CREATININE 1.24 02/05/2020 0203   CREATININE 1.2 02/23/2017 1237   CALCIUM 8.9 02/05/2020 0203   CALCIUM 9.6 02/23/2017 1237   GFRNONAA 56 (L) 02/05/2020 0203   GFRAA >60 02/05/2020 0203    Lab Results  Component Value Date   INR 1.2 02/04/2020   INR 1.3 (H) 06/27/2019   INR 1.3 (H) 05/22/2019    Assessment/Plan: POD #1 s/p Procedure(s): INTRAMEDULLARY (IM) NAIL INTERTROCHANTRIC Stable from ortho standpoint DVT prophylaxis Dr [name] planning amputation of toe.  PT, OT 25% WB Right LE  Doran Heater. [name], MD 02/05/2020 9:32 AM

## 2020-02-05 NOTE — Op Note (Signed)
NAME: [name], [name] MEDICAL RECORD [id number] ACCOUNT [id number] DATE OF BIRTH:21-Dec-1940 FACILITY: [facility] LOCATION: [facility] PHYSICIAN:STEVEN Orlena Sheldon, MD  OPERATIVE REPORT  DATE OF PROCEDURE:  02/04/2020  PREOPERATIVE DIAGNOSIS:  Right intertrochanteric femur fracture.  POSTOPERATIVE DIAGNOSIS:  Right intertrochanteric femur fracture.  PROCEDURE PERFORMED:  Closed intramedullary nailing of right intertrochanteric femur fracture using Biomet short trochanter entry nail.  ATTENDING SURGEON:  Esmond Plants, MD  ASSISTANT:  Darol Destine, [place], who was scrubbed during the entire procedure and necessary for satisfactory completion of surgery.  ANESTHESIA:  General anesthesia was used.  ESTIMATED BLOOD LOSS:  100 mL.  FLUID REPLACEMENT:  1500 mL crystalloid.  INSTRUMENT COUNTS:  Correct.  COMPLICATIONS:  No complications.  ANTIBIOTICS:  Perioperative antibiotics were given.  INDICATIONS:  The patient is a 79 year old male who suffered a ground level fall, injuring his right hip.  The patient was unable to ambulate after the fall due to hip pain.  The patient presented to the emergency room with a minimally displaced spiral  intertrochanteric femur fracture.  Given the unstable nature of this fracture, we recommended surgery with closed intramedullary nailing with a short trochanteric entry nail.  The patient agreed to this.  Informed consent obtained.  The patient medically  optimized and ready for surgery.  Informed consent obtained.  DESCRIPTION OF PROCEDURE:  After an adequate level of anesthesia was achieved, the patient was positioned in the supine position.  He was positioned on the fracture table and a perineal post was utilized.  We then used traction on the right leg and in  slight internal rotation and we placed the left leg in a boot and slightly extended the hip, so that we could get appropriate visualization with multiplanar C-arm.  We positioned the patient  such we could get C-arm images verifying correct the alignment  of the fracture.  We then sterilely prepped and draped the right leg and then applied a sterile shower curtain.  Timeout was called, verifying correct patient, correct site.  A longitudinal incision proximal to the greater trochanter performed with a 10  blade scalpel, dissection down through subcutaneous tissues using Bovie.  A curved Mayo scissors to pop through the tensor fascia lata.  We identified our starting point on the greater trochanter and placed a guide pin across the fracture site,  appropriately aligned on the AP and lateral views.  We then introduced our step-cut drill to open the proximal femur.  Once we did that, we selected the 130-degree 11 mm short nail from Biomet and we inserted that antegrade across the fracture site,  tapping that to the appropriate depth.  We next went ahead and placed our guide pin up into the femur and it was a low centered low in the femoral neck and centered on the lateral, so low on the AP and centered on the lateral.  Once we had that to the  appropriate depth, we measured the lag screw at a 120 mm length.  We then drilled for the lag screw and then applied the lag screw across into the good bone of the subchondral femoral head.  Once we had that appropriately positioned, we then used our  tensioner, we took tension off of the traction and then used the compression component, compressing the fracture site under direct visualization with the C-arm and then went ahead and placed our set screw proximally, tightened it all the way down and  turned it back one-quarter turn to allow for some collapse if  that is warranted.  We then went ahead and removed our drill sleeves and placed our drill sleeve for the distal interlocking screw in the static hole.  We popped through the skin with a 10  blade scalpel, again with a Mayo scissors to pop through the fascia and then we introduced our drill sleeves down to  bone.  We drilled the hole for the 4.5 interlocking screw and we selected a 40 mm screw.  Once we had that across and visualized on AP  and lateral views, we removed our insertion handle, which was our jig for our distal interlock and then got our final x-rays.  We irrigated thoroughly and closed in layers with 0 Vicryl for the fascial layers, 2-0 Vicryl for subcutaneous and staples for  skin.  Sterile bandage applied.  The patient transported to the recovery room stretcher in stable condition having tolerated surgery well.  VN/NUANCE  D:02/04/2020 T:02/04/2020 JOB:[id number]

## 2020-02-05 NOTE — Procedures (Signed)
Patient declined CPAP for tonight.  

## 2020-02-05 NOTE — H&P (View-Only) (Signed)
ORTHOPAEDIC CONSULTATION  REQUESTING PHYSICIAN: Florencia Reasons, MD  Chief Complaint: Osteomyelitis right foot second toe ulceration  HPI: [name]. is a 79 y.o. male who presents with right intertrochanteric hip fracture as well as ulceration right second toe.  Patient states that he works around the house barefoot he has been dragging his foot and sustained a full-thickness ulcer to the PIP joint right second toe with recent fall and intertrochanteric right hip fracture.  Patient is status post internal fixation of the hip fracture.  Does have a pacemaker with a history of atrial fibrillation.  Past Medical History:  Diagnosis Date   (HFpEF) heart failure with preserved ejection fraction ([place])    a. 05/2013 Echo: EF 55%, mild LVH, diast dysfxn, Ao sclerosis, mildly dil LA, RV dysfxn (poorly visualized), PASP 75mmHg;  b. 03/2017 Echo: EF 55-60%, no rwma, triv MR, mildly dil RV, mod TR, PASP 37mmHg.   Atrial fibrillation ([place])    s/p Cox Maze 1/09;  Multaq Rx d/c'd in 2014 due to pulmo fibrosis;  coumadin d/c'd in 2014 due to spontaneous subdural hematoma   BPH (benign prostatic hyperplasia)    CAD (coronary artery disease), native coronary artery    a. s/p CABG 12/2007;  b. Myoview 12/2011: EF 66%, no scar or ischemia; normal.   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 Jude   Peripheral neuropathy 07/31/2018   Pulmonary fibrosis ([place])    Multaq d/c'd 7/14   Subdural hematoma ([place]) 07/2012   spontaneous;  coumadin d/c'd => no longer a candidate for anticoagulation   Past Surgical History:  Procedure Laterality Date   AMPUTATION Left 05/17/2019   Procedure: AMPUTATION LEFT FOURTH TOE;  Surgeon: Meredith Pel, MD;  Location: [facility];  Service: Orthopedics;  Laterality: Left;   APPENDECTOMY      CHOLECYSTECTOMY     CORONARY ARTERY BYPASS GRAFT     x3 (left internal mammary artery to distal left anterior descending coronary artery, saphenous vain graft to second circumflex marginal branch, saphenous vain graft to posterior descending coronary artery, endoscopic saphenous vain harvest from right thigh) and modified Cox - Maze IV procedure.  [name],MD. Electronically signed CHO/MEDQ D: 01/09/2008/ JOB: [id number] cc:  [name]   CRANIOTOMY  07/30/2012   Procedure: CRANIOTOMY HEMATOMA EVACUATION SUBDURAL;  Surgeon: [name], MD;  Location: [facility];  Service: Neurosurgery;  Laterality: Right;  Right craniotomy for evacuation of subdural hematoma   FOOT SURGERY     HERNIA REPAIR     ORCHIECTOMY     Left  /  testicular cancer   PACEMAKER PLACEMENT     PPM - St. Jude   PPM GENERATOR CHANGEOUT N/A 06/25/2019   Procedure: PPM GENERATOR CHANGEOUT;  Surgeon: Evans Lance, MD;  Location: [facility];  Service: Cardiovascular;  Laterality: N/A;   Social History   Socioeconomic History   Marital status: Married    Spouse name: Vikky   Number of children: 2   Years of education: Not on file   Highest education level: Not on file  Occupational History   Occupation: Retired- IT trainer  Tobacco Use   Smoking status: Former Smoker    Packs/day: 2.00    Years: 20.00  Pack years: 40.00    Types: Cigarettes    Quit date: 02/21/1991    Years since quitting: 28.9   Smokeless tobacco: Never Used  Substance and Sexual Activity   Alcohol use: No    Alcohol/week: 0.0 standard drinks   Drug use: No   Sexual activity: Not Currently  Other Topics Concern   Not on file  Social History Narrative   Lives with wife   Right handed    Married with two children.     He is a Engineer, structural.     Social Determinants of Health   Financial Resource Strain:    Difficulty of Paying Living Expenses: Not on file  Food Insecurity:    Worried About  Charity fundraiser in the Last Year: Not on file   [employer] of Food in the Last Year: Not on file  Transportation Needs:    Lack of Transportation (Medical): Not on file   Lack of Transportation (Non-Medical): Not on file  Physical Activity:    Days of Exercise per Week: Not on file   Minutes of Exercise per Session: Not on file  Stress:    Feeling of Stress : Not on file  Social Connections:    Frequency of Communication with Friends and Family: Not on file   Frequency of Social Gatherings with Friends and Family: Not on file   Attends Religious Services: Not on file   Active Member of Clubs or Organizations: Not on file   Attends Archivist Meetings: Not on file   Marital Status: Not on file   Family History  Problem Relation Age of Onset   Heart disease Father    Heart attack Father    Heart failure Father    Heart disease Mother    Alzheimer's disease Mother    Dementia Mother    - negative except otherwise stated in the family history section Allergies  Allergen Reactions   Lipitor [Atorvastatin] Other (See Comments)    Stiff joints   Nsaids Other (See Comments)    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   Warfarin And Related Other (See Comments)    Stiff joints   Enbrel [Etanercept] Itching   Prior to Admission medications   Medication Sig Start Date End Date Taking? Authorizing Provider  allopurinol (ZYLOPRIM) 300 MG tablet Take 300 mg by mouth daily.  06/21/16  Yes [provider]  furosemide (LASIX) 40 MG tablet Take 60 mg by mouth 2 (two) times daily.    Yes [provider]  metoprolol tartrate (LOPRESSOR) 25 MG tablet Take 1 tablet (25 mg total) by mouth 2 (two) times daily. 01/07/20  Yes Lendon Colonel, NP  mirabegron ER (MYRBETRIQ) 50 MG TB24 tablet Take 1 tablet (50 mg total) by mouth daily. 10/06/19  Yes Kathrynn Ducking, MD  pantoprazole (PROTONIX) 40 MG tablet Take 40 mg by mouth daily. 02/22/17  Yes [provider]  potassium chloride SA (K-DUR) 20 MEQ tablet Take 1 tablet (20 mEq total) by mouth daily. 06/04/19  Yes Love, Ivan Anchors, PA-C  rosuvastatin (CRESTOR) 40 MG tablet Take 1 tablet (40 mg total) by mouth daily. Patient taking differently: Take 40 mg by mouth every evening.  12/25/18  Yes [name], MD  sertraline (ZOLOFT) 100 MG tablet Take 200 mg by mouth daily.    Yes [provider]  torsemide (DEMADEX) 20 MG tablet Take 1 tablet (20 mg total) by mouth 2 (two) times daily.  12/10/19  Yes [name], MD  acetaminophen (TYLENOL) 325 MG tablet Take 1-2 tablets (325-650 mg total) by mouth every 4 (four) hours as needed for mild pain. 05/28/19   Love, Ivan Anchors, PA-C  HYDROcodone-acetaminophen (NORCO) 5-325 MG tablet Take 1 tablet by mouth every 6 (six) hours as needed for moderate pain or severe pain. 02/04/20   Netta Cedars, MD  methocarbamol (ROBAXIN) 500 MG tablet Take 1 tablet (500 mg total) by mouth every 8 (eight) hours as needed. 02/04/20   Netta Cedars, MD  tamsulosin (FLOMAX) 0.4 MG CAPS capsule Take 0.4 mg by mouth at bedtime.     [provider]   DG Chest 1 View  Result Date: 02/04/2020 CLINICAL DATA:  Fall EXAM: CHEST  1 VIEW COMPARISON:  05/15/2019 FINDINGS: The heart size and mediastinal contours are within normal limits. Both lungs are clear. The visualized skeletal structures are unremarkable. Median sternotomy and left chest wall pacemaker. IMPRESSION: No active disease. Electronically Signed   By: Ulyses Jarred M.D.   On: 02/04/2020 00:37   DG Elbow 2 Views Right  Result Date: 02/04/2020 CLINICAL DATA:  Right elbow and hip pain after fall EXAM: RIGHT ELBOW - 2 VIEW COMPARISON:  None. FINDINGS: There is no evidence of fracture, dislocation, or joint effusion. There is no evidence of arthropathy or other focal bone abnormality. Soft tissues are unremarkable. IMPRESSION: Negative. Electronically Signed   By: Ulyses Jarred M.D.   On: 02/04/2020  00:29   CT Head Wo Contrast  Result Date: 02/04/2020 CLINICAL DATA:  Fall EXAM: CT HEAD WITHOUT CONTRAST CT CERVICAL SPINE WITHOUT CONTRAST TECHNIQUE: Multidetector CT imaging of the head and cervical spine was performed following the standard protocol without intravenous contrast. Multiplanar CT image reconstructions of the cervical spine were also generated. COMPARISON:  None. FINDINGS: CT HEAD FINDINGS Brain: There is no mass, hemorrhage or extra-axial collection. The size and configuration of the ventricles and extra-axial CSF spaces are normal. Areas of hypoattenuation of the deep gray nuclei and confluent periventricular white matter hypodensity, consistent with chronic small vessel disease. 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 Vascular: No abnormal hyperdensity of the major intracranial arteries or dural venous sinuses. No intracranial atherosclerosis. Unchanged calcification along the course of the left M2 segment. Skull: Remote right-sided craniotomy. Sinuses/Orbits: No fluid levels or advanced mucosal thickening of the visualized paranasal sinuses. No mastoid or middle ear effusion. The orbits are normal. CT CERVICAL SPINE FINDINGS Alignment: No static subluxation. Facets are aligned. Occipital condyles are normally positioned. Skull base and vertebrae: No acute fracture. Soft tissues and spinal canal: No prevertebral fluid or swelling. No visible canal hematoma. Disc levels: No bony spinal canal stenosis. Multilevel mild vertebral body height loss, particularly at C3 and C4. Facet hypertrophy is worst at right C3-4 and left C4-5. Upper chest: No pneumothorax, pulmonary nodule or pleural effusion. Other: Normal visualized paraspinal cervical soft tissues. IMPRESSION: 1. Chronic ischemic microangiopathy without acute intracranial abnormality. 2. No acute fracture or static subluxation of the cervical spine. Electronically Signed   By: Ulyses Jarred M.D.   On: 02/04/2020 00:54    CT Cervical Spine Wo Contrast  Result Date: 02/04/2020 CLINICAL DATA:  Fall EXAM: CT HEAD WITHOUT CONTRAST CT CERVICAL SPINE WITHOUT CONTRAST TECHNIQUE: Multidetector CT imaging of the head and cervical spine was performed following the standard protocol without intravenous contrast. Multiplanar CT image reconstructions of the cervical spine were also generated. COMPARISON:  None. FINDINGS: CT HEAD FINDINGS Brain: There is no  mass, hemorrhage or extra-axial collection. The size and configuration of the ventricles and extra-axial CSF spaces are normal. Areas of hypoattenuation of the deep gray nuclei and confluent periventricular white matter hypodensity, consistent with chronic small vessel disease. 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 Vascular: No abnormal hyperdensity of the major intracranial arteries or dural venous sinuses. No intracranial atherosclerosis. Unchanged calcification along the course of the left M2 segment. Skull: Remote right-sided craniotomy. Sinuses/Orbits: No fluid levels or advanced mucosal thickening of the visualized paranasal sinuses. No mastoid or middle ear effusion. The orbits are normal. CT CERVICAL SPINE FINDINGS Alignment: No static subluxation. Facets are aligned. Occipital condyles are normally positioned. Skull base and vertebrae: No acute fracture. Soft tissues and spinal canal: No prevertebral fluid or swelling. No visible canal hematoma. Disc levels: No bony spinal canal stenosis. Multilevel mild vertebral body height loss, particularly at C3 and C4. Facet hypertrophy is worst at right C3-4 and left C4-5. Upper chest: No pneumothorax, pulmonary nodule or pleural effusion. Other: Normal visualized paraspinal cervical soft tissues. IMPRESSION: 1. Chronic ischemic microangiopathy without acute intracranial abnormality. 2. No acute fracture or static subluxation of the cervical spine. Electronically Signed   By: Ulyses Jarred M.D.   On: 02/04/2020 00:54    DG Foot 2 Views Right  Result Date: 02/04/2020 CLINICAL DATA:  First and second toe infection for 1 week, initial encounter EXAM: RIGHT FOOT - 2 VIEW COMPARISON:  None. FINDINGS: Degenerative changes of the tarsal bones are noted. No definitive bony erosive changes are seen to suggest osteomyelitis. Mild soft tissue swelling is seen. No fractures are noted. IMPRESSION: No findings to suggest osteomyelitis. Electronically Signed   By: Inez Catalina M.D.   On: 02/04/2020 16:36   DG C-Arm 1-60 Min  Result Date: 02/04/2020 CLINICAL DATA:  ORIF proximal right femur fracture EXAM: DG C-ARM 1-60 MIN; RIGHT FEMUR 2 VIEWS COMPARISON:  02/04/2020 right hip radiographs FLUOROSCOPY TIME:  Fluoroscopy Time:  1 minutes 0 seconds Number of Acquired Spot Images: 4 FINDINGS: Multiple nondiagnostic spot fluoroscopic intraoperative right hip radiographs demonstrate transfixation of intertrochanteric proximal right femur fracture by intramedullary rod with interlocking proximal right femoral neck pin and distal interlocking screw. IMPRESSION: Intraoperative fluoroscopic guidance for ORIF intertrochanteric proximal right femur fracture. Electronically Signed   By: Ilona Sorrel M.D.   On: 02/04/2020 19:25   DG Hip Unilat W or Wo Pelvis 2-3 Views Right  Result Date: 02/04/2020 CLINICAL DATA:  Right elbow and hip pain after fall EXAM: DG HIP (WITH OR WITHOUT PELVIS) 2-3V RIGHT COMPARISON:  None. FINDINGS: Nondisplaced intertrochanteric fracture, best seen on cross-table lateral view. No dislocation. No pelvic fracture. IMPRESSION: Nondisplaced right intertrochanteric fracture. Electronically Signed   By: Ulyses Jarred M.D.   On: 02/04/2020 00:34   DG FEMUR, MIN 2 VIEWS RIGHT  Result Date: 02/04/2020 CLINICAL DATA:  ORIF proximal right femur fracture EXAM: DG C-ARM 1-60 MIN; RIGHT FEMUR 2 VIEWS COMPARISON:  02/04/2020 right hip radiographs FLUOROSCOPY TIME:  Fluoroscopy Time:  1 minutes 0 seconds Number of Acquired Spot  Images: 4 FINDINGS: Multiple nondiagnostic spot fluoroscopic intraoperative right hip radiographs demonstrate transfixation of intertrochanteric proximal right femur fracture by intramedullary rod with interlocking proximal right femoral neck pin and distal interlocking screw. IMPRESSION: Intraoperative fluoroscopic guidance for ORIF intertrochanteric proximal right femur fracture. Electronically Signed   By: Ilona Sorrel M.D.   On: 02/04/2020 19:25   - pertinent xrays, CT, MRI studies were reviewed and independently interpreted  Positive ROS: All other  systems have been reviewed and were otherwise negative with the exception of those mentioned in the HPI and as above.  Physical Exam: General: Alert, no acute distress Psychiatric: Patient is competent for consent with normal mood and affect Lymphatic: No axillary or cervical lymphadenopathy Cardiovascular: No pedal edema Respiratory: No cyanosis, no use of accessory musculature GI: No organomegaly, abdomen is soft and non-tender    Images:  @ENCIMAGES @  Labs:  Lab Results  Component Value Date   HGBA1C 5.6 05/21/2019   HGBA1C 5.7 (H) 07/30/2012   HGBA1C  01/07/2008    5.3 (NOTE)   The ADA recommends the following therapeutic goals for glycemic   control related to Hgb A1C measurement:   Goal of Therapy:   < 7.0% Hgb A1C   Action Suggested:  > 8.0% Hgb A1C   Ref:  Diabetes Care, 22, Suppl. 1, 1999   ESRSEDRATE 40 (H) 05/01/2019   ESRSEDRATE 45 (H) 12/16/2018   ESRSEDRATE 18 06/18/2018   CRP <0.5 06/16/2013   LABURIC 3.9 12/16/2018   REPTSTATUS 05/20/2019 FINAL 05/15/2019   CULT  05/15/2019    NO GROWTH 5 DAYS Performed at [facility], [place] [address] [place],  [address]     Lab Results  Component Value Date   ALBUMIN 3.8 09/25/2019   ALBUMIN 3.5 05/27/2019   ALBUMIN 3.2 (L) 05/26/2019   LABURIC 3.9 12/16/2018    Neurologic: Patient does not have protective sensation bilateral lower  extremities.   MUSCULOSKELETAL:   Skin: Examination patient has a full-thickness ulcer right foot second toe PIP joint with exposed bone.  There is no sausage digit swelling no ascending cellulitis.  Patient has a very faint palpable dorsalis pedis pulse.  Ankle-brachial indices last year showed triphasic flow with normal ABIs.  Assessment: Assessment: Osteomyelitis and ulceration right foot second toe PIP joint.  Plan: Plan: We will plan for amputation right foot second toe tomorrow.  Risk and benefits were discussed including protected weightbearing for several weeks for incision healing.  Patient states he understands wished to proceed at this time.  Thank you for the consult and the opportunity to see Mr. Elise Benne, MD [employer] [phone] 8:25 AM

## 2020-02-05 NOTE — Evaluation (Signed)
Clinical/Bedside Swallow Evaluation Patient Details  Name: [name]. MRN: [id number] Date of Birth: [birth year]  Today's Date: 02/05/2020 Time: SLP Start Time (ACUTE ONLY): 0900 SLP Stop Time (ACUTE ONLY): 0920 SLP Time Calculation (min) (ACUTE ONLY): 20 min  Past Medical History:  Past Medical History:  Diagnosis Date  . (HFpEF) heart failure with preserved ejection fraction ([place])    a. 05/2013 Echo: EF 55%, mild LVH, diast dysfxn, Ao sclerosis, mildly dil LA, RV dysfxn (poorly visualized), PASP 67mmHg;  b. 03/2017 Echo: EF 55-60%, no rwma, triv MR, mildly dil RV, mod TR, PASP 64mmHg.  . Atrial fibrillation [facility])    s/p Cox Maze 1/09;  Multaq Rx d/c'd in 2014 due to pulmo fibrosis;  coumadin d/c'd in 2014 due to spontaneous subdural hematoma  . BPH (benign prostatic hyperplasia)   . CAD (coronary artery disease), native coronary artery    a. s/p CABG 12/2007;  b. Myoview 12/2011: EF 66%, no scar or ischemia; normal.  . DM (diabetes mellitus) ([place])   . Hyperlipidemia type II   . Hypertension   . 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 OSA (obstructive sleep apnea)   . Pacemaker    PPM - St. Jude  . Peripheral neuropathy 07/31/2018  . Pulmonary fibrosis ([place])    Multaq d/c'd 7/14  . Subdural hematoma ([place]) 07/2012   spontaneous;  coumadin d/c'd => no longer a candidate for anticoagulation   Past Surgical History:  Past Surgical History:  Procedure Laterality Date  . AMPUTATION Left 05/17/2019   Procedure: AMPUTATION LEFT FOURTH TOE;  Surgeon: Meredith Pel, MD;  Location: [place];  Service: Orthopedics;  Laterality: Left;  . APPENDECTOMY    . CHOLECYSTECTOMY    . CORONARY ARTERY BYPASS GRAFT     x3 (left internal mammary artery to distal left anterior descending coronary artery, saphenous vain graft to second circumflex marginal branch, saphenous vain graft to posterior descending coronary artery, endoscopic saphenous vain harvest from right  thigh) and modified Cox - Maze IV procedure.  [name],MD. Electronically signed CHO/MEDQ D: 01/09/2008/ JOB: [id number] cc:  [name]  . [name]  07/30/2012   Procedure: CRANIOTOMY HEMATOMA EVACUATION SUBDURAL;  Surgeon: [name], MD;  Location: [place] [facility];  Service: Neurosurgery;  Laterality: Right;  Right craniotomy for evacuation of subdural hematoma  . FOOT SURGERY    . HERNIA REPAIR    . ORCHIECTOMY     Left  /  testicular cancer  . PACEMAKER PLACEMENT     PPM - St. Jude  . PPM GENERATOR CHANGEOUT N/A 06/25/2019   Procedure: PPM GENERATOR CHANGEOUT;  Surgeon: Evans Lance, MD;  Location: [place] [facility];  Service: Cardiovascular;  Laterality: N/A;   HPI:  Jamie G Gianpiero Bras. is a 79 y.o. male who presents with right intertrochanteric hip fracture as well as ulceration right second toe.  Patient states that he works around the house barefoot he has been dragging his foot and sustained a full-thickness ulcer to the PIP joint right second toe with recent fall and intertrochanteric right hip fracture.  Patient is status post internal fixation of the hip fracture on 2/17.  Plan for toe amputation on 2/19.    Assessment / Plan / Recommendation Clinical Impression  Pt demonstrates normal swallow ability without signs of aspiration. He does report occasional globus, but independently utilizes appropraite strategies (posture, following bites with sips, movement after eating). Reinforced basic precautions.  Will sign off at this time.  SLP Visit Diagnosis: Dysphagia, unspecified (R13.10)    Aspiration Risk  Mild aspiration risk    Diet Recommendation Regular;Thin liquid   Liquid Administration via: Cup;Straw Medication Administration: Whole meds with liquid Supervision: Patient able to self feed Postural Changes: Seated upright at 90 degrees;Remain upright for at least 30 minutes after po intake    Other  Recommendations Oral Care Recommendations: Oral care BID    Follow up Recommendations None      Frequency and Duration            Prognosis        Swallow Study   General HPI: Jamie Umair Graddick. is a 79 y.o. male who presents with right intertrochanteric hip fracture as well as ulceration right second toe.  Patient states that he works around the house barefoot he has been dragging his foot and sustained a full-thickness ulcer to the PIP joint right second toe with recent fall and intertrochanteric right hip fracture.  Patient is status post internal fixation of the hip fracture on 2/17.  Plan for toe amputation on 2/19.  Type of Study: Bedside Swallow Evaluation Previous Swallow Assessment: none Diet Prior to this Study: Regular;Thin liquids Temperature Spikes Noted: No Respiratory Status: Room air History of Recent Intubation: No Behavior/Cognition: Alert;Cooperative;Pleasant mood Oral Cavity Assessment: Within Functional Limits Oral Care Completed by SLP: No Oral Cavity - Dentition: Adequate natural dentition Vision: Functional for self-feeding Self-Feeding Abilities: Able to feed self Patient Positioning: Partially reclined Baseline Vocal Quality: Normal Volitional Cough: Strong Volitional Swallow: Able to elicit    Oral/Motor/Sensory Function Overall Oral Motor/Sensory Function: Within functional limits   Ice Chips Ice chips: Within functional limits   Thin Liquid Thin Liquid: Within functional limits Presentation: Straw;Self Fed    Nectar Thick Nectar Thick Liquid: Not tested   Honey Thick Honey Thick Liquid: Not tested   Puree Puree: Within functional limits   Solid     Solid: Within functional limits      Yuna Pizzolato, Katherene Ponto 02/05/2020,9:56 AM

## 2020-02-06 ENCOUNTER — Encounter (HOSPITAL_COMMUNITY)
Admission: EM | Disposition: A | Discharge status: Rehabilitation Facility | Payer: Self-pay | Source: Home / Self Care | Attending: Internal Medicine

## 2020-02-06 ENCOUNTER — Inpatient Hospital Stay (HOSPITAL_COMMUNITY): Payer: Medicare Other | Admitting: Anesthesiology

## 2020-02-06 ENCOUNTER — Encounter: Payer: Self-pay | Admitting: *Deleted

## 2020-02-06 HISTORY — PX: AMPUTATION TOE: SHX6595

## 2020-02-06 LAB — URINALYSIS, ROUTINE W REFLEX MICROSCOPIC
Bilirubin Urine: NEGATIVE
Glucose, UA: NEGATIVE mg/dL
Hgb urine dipstick: NEGATIVE
Ketones, ur: NEGATIVE mg/dL
Leukocytes,Ua: NEGATIVE
Nitrite: NEGATIVE
Protein, ur: NEGATIVE mg/dL
Specific Gravity, Urine: 1.015 (ref 1.005–1.030)
pH: 5 (ref 5.0–8.0)

## 2020-02-06 LAB — CBC
HCT: 31.4 % — ABNORMAL LOW (ref 39.0–52.0)
Hemoglobin: 10.2 g/dL — ABNORMAL LOW (ref 13.0–17.0)
MCH: 30.7 pg (ref 26.0–34.0)
MCHC: 32.5 g/dL (ref 30.0–36.0)
MCV: 94.6 fL (ref 80.0–100.0)
Platelets: 87 10*3/uL — ABNORMAL LOW (ref 150–400)
RBC: 3.32 MIL/uL — ABNORMAL LOW (ref 4.22–5.81)
RDW: 15.2 % (ref 11.5–15.5)
WBC: 5.4 10*3/uL (ref 4.0–10.5)
nRBC: 0 % (ref 0.0–0.2)

## 2020-02-06 LAB — BASIC METABOLIC PANEL
Anion gap: 8 (ref 5–15)
BUN: 37 mg/dL — ABNORMAL HIGH (ref 8–23)
CO2: 28 mmol/L (ref 22–32)
Calcium: 8.4 mg/dL — ABNORMAL LOW (ref 8.9–10.3)
Chloride: 102 mmol/L (ref 98–111)
Creatinine, Ser: 1.41 mg/dL — ABNORMAL HIGH (ref 0.61–1.24)
GFR calc Af Amer: 55 mL/min — ABNORMAL LOW (ref >60)
GFR calc non Af Amer: 48 mL/min — ABNORMAL LOW (ref >60)
Glucose, Bld: 99 mg/dL (ref 70–99)
Potassium: 3.9 mmol/L (ref 3.5–5.1)
Sodium: 138 mmol/L (ref 135–145)

## 2020-02-06 LAB — MAGNESIUM: Magnesium: 2 mg/dL (ref 1.7–2.4)

## 2020-02-06 LAB — GLUCOSE, CAPILLARY
Glucose-Capillary: 85 mg/dL (ref 70–99)
Glucose-Capillary: 93 mg/dL (ref 70–99)

## 2020-02-06 SURGERY — AMPUTATION, TOE
Anesthesia: General | Site: Toe | Laterality: Right

## 2020-02-06 MED ORDER — PROMETHAZINE HCL 25 MG/ML IJ SOLN: 6.2500 mg | INTRAMUSCULAR | Status: DC | PRN

## 2020-02-06 MED ORDER — METOCLOPRAMIDE HCL 5 MG/ML IJ SOLN: 5.0000 mg | Freq: Three times a day (TID) | INTRAMUSCULAR | Status: DC | PRN

## 2020-02-06 MED ORDER — PROPOFOL 500 MG/50ML IV EMUL
INTRAVENOUS | Status: DC | PRN
  Administered 2020-02-06: 50 ug/kg/min via INTRAVENOUS

## 2020-02-06 MED ORDER — 0.9 % SODIUM CHLORIDE (POUR BTL) OPTIME
TOPICAL | Status: DC | PRN
  Administered 2020-02-06: 1000 mL

## 2020-02-06 MED ORDER — OXYCODONE HCL 5 MG PO TABS: 5.0000 mg | ORAL_TABLET | Freq: Once | ORAL | Status: DC | PRN

## 2020-02-06 MED ORDER — ALBUMIN HUMAN 5 % IV SOLN
12.5000 g | Freq: Once | INTRAVENOUS | Status: AC
  Administered 2020-02-06: 12.5 g via INTRAVENOUS

## 2020-02-06 MED ORDER — SODIUM CHLORIDE 0.9 % IV BOLUS
500.0000 mL | Freq: Once | INTRAVENOUS | Status: AC
  Administered 2020-02-06: 500 mL via INTRAVENOUS

## 2020-02-06 MED ORDER — OXYCODONE HCL 5 MG/5ML PO SOLN: 5.0000 mg | Freq: Once | ORAL | Status: DC | PRN

## 2020-02-06 MED ORDER — TAMSULOSIN HCL 0.4 MG PO CAPS
0.4000 mg | ORAL_CAPSULE | Freq: Two times a day (BID) | ORAL | Status: DC
  Administered 2020-02-06 – 2020-02-08 (×4): 0.4 mg via ORAL
  Filled 2020-02-06 (×4): qty 1

## 2020-02-06 MED ORDER — ALBUMIN HUMAN 5 % IV SOLN: INTRAVENOUS | Status: DC | PRN

## 2020-02-06 MED ORDER — METOCLOPRAMIDE HCL 5 MG PO TABS: 5.0000 mg | ORAL_TABLET | Freq: Three times a day (TID) | ORAL | Status: DC | PRN

## 2020-02-06 MED ORDER — HYDROMORPHONE HCL 1 MG/ML IJ SOLN: 0.5000 mg | INTRAMUSCULAR | Status: DC | PRN

## 2020-02-06 MED ORDER — PHENYLEPHRINE HCL-NACL 10-0.9 MG/250ML-% IV SOLN
INTRAVENOUS | Status: DC | PRN
  Administered 2020-02-06: 25 ug/min via INTRAVENOUS

## 2020-02-06 MED ORDER — BUPIVACAINE HCL (PF) 0.5 % IJ SOLN
INTRAMUSCULAR | Status: AC
  Filled 2020-02-06: qty 30

## 2020-02-06 MED ORDER — SODIUM CHLORIDE 0.9 % IV SOLN: INTRAVENOUS | Status: DC

## 2020-02-06 MED ORDER — HYDROMORPHONE HCL 1 MG/ML IJ SOLN
INTRAMUSCULAR | Status: AC
  Filled 2020-02-06: qty 1

## 2020-02-06 MED ORDER — LACTATED RINGERS IV SOLN: INTRAVENOUS | Status: DC

## 2020-02-06 MED ORDER — FENTANYL CITRATE (PF) 250 MCG/5ML IJ SOLN
INTRAMUSCULAR | Status: AC
  Filled 2020-02-06: qty 5

## 2020-02-06 MED ORDER — CEFAZOLIN SODIUM-DEXTROSE 2-4 GM/100ML-% IV SOLN
2.0000 g | Freq: Four times a day (QID) | INTRAVENOUS | Status: AC
  Administered 2020-02-06 – 2020-02-07 (×3): 2 g via INTRAVENOUS
  Filled 2020-02-06 (×3): qty 100

## 2020-02-06 MED ORDER — PROPOFOL 10 MG/ML IV BOLUS
INTRAVENOUS | Status: AC
  Filled 2020-02-06: qty 60

## 2020-02-06 MED ORDER — HYDROMORPHONE HCL 1 MG/ML IJ SOLN: 0.2500 mg | INTRAMUSCULAR | Status: DC | PRN

## 2020-02-06 MED ORDER — BUPIVACAINE HCL (PF) 0.5 % IJ SOLN
INTRAMUSCULAR | Status: DC | PRN
  Administered 2020-02-06: 20 mL

## 2020-02-06 SURGICAL SUPPLY — 28 items
BLADE SURG 21 STRL SS (BLADE) ×2 IMPLANT
BNDG COHESIVE 4X5 TAN STRL (GAUZE/BANDAGES/DRESSINGS) ×2 IMPLANT
BNDG ESMARK 4X9 LF (GAUZE/BANDAGES/DRESSINGS) IMPLANT
BNDG GAUZE ELAST 4 BULKY (GAUZE/BANDAGES/DRESSINGS) ×2 IMPLANT
COVER SURGICAL LIGHT HANDLE (MISCELLANEOUS) ×3 IMPLANT
COVER WAND RF STERILE (DRAPES) ×1 IMPLANT
DRAPE U-SHAPE 47X51 STRL (DRAPES) ×2 IMPLANT
DRSG ADAPTIC 3X8 NADH LF (GAUZE/BANDAGES/DRESSINGS) ×1 IMPLANT
DRSG PAD ABDOMINAL 8X10 ST (GAUZE/BANDAGES/DRESSINGS) ×2 IMPLANT
DURAPREP 26ML APPLICATOR (WOUND CARE) ×2 IMPLANT
ELECT REM PT RETURN 9FT ADLT (ELECTROSURGICAL) ×2
ELECTRODE REM PT RTRN 9FT ADLT (ELECTROSURGICAL) ×1 IMPLANT
GAUZE SPONGE 4X4 12PLY STRL (GAUZE/BANDAGES/DRESSINGS) ×1 IMPLANT
GLOVE BIOGEL PI IND STRL 9 (GLOVE) ×1 IMPLANT
GLOVE BIOGEL PI INDICATOR 9 (GLOVE) ×1
GLOVE SURG ORTHO 9.0 STRL STRW (GLOVE) ×2 IMPLANT
GOWN STRL REUS W/ TWL XL LVL3 (GOWN DISPOSABLE) ×2 IMPLANT
GOWN STRL REUS W/TWL XL LVL3 (GOWN DISPOSABLE) ×4
KIT BASIN OR (CUSTOM PROCEDURE TRAY) ×2 IMPLANT
KIT TURNOVER KIT B (KITS) ×2 IMPLANT
MANIFOLD NEPTUNE II (INSTRUMENTS) ×1 IMPLANT
NEEDLE 22X1 1/2 (OR ONLY) (NEEDLE) ×1 IMPLANT
NS IRRIG 1000ML POUR BTL (IV SOLUTION) ×2 IMPLANT
PACK ORTHO EXTREMITY (CUSTOM PROCEDURE TRAY) ×2 IMPLANT
PAD ARMBOARD 7.5X6 YLW CONV (MISCELLANEOUS) ×2 IMPLANT
SUT ETHILON 2 0 PSLX (SUTURE) ×2 IMPLANT
SYR CONTROL 10ML LL (SYRINGE) ×1 IMPLANT
TOWEL GREEN STERILE (TOWEL DISPOSABLE) ×2 IMPLANT

## 2020-02-06 NOTE — Progress Notes (Signed)
Patient with elevated HR 124, soft BP 90/73, asymptomatic, denies chest pain, no acute distress noted. Per floor nurse, patient's beta blocker was held this morning due to soft BP. Dr. Sabra Heck notified. Verbal order received to run fluids wide open. Patient on monitor.

## 2020-02-06 NOTE — Progress Notes (Signed)
PROGRESS NOTE  [name]. CNO:[id number] DOB: [birth year] DOA: 02/03/2020 PCP: Jamie Pretty, MD  Brief summary:  [name]. is a 79 y.o. male with medical history significant for chronic diastolic and right-sided heart failure, atrial fibrillation status post Maze procedure no longer anticoagulated after spontaneous SDH, CAD status post CABG, symptomatic bradycardia now with pacer, chronic kidney disease stage IIIa, OSA on CPAP, chronic anemia and thrombocytopenia, pulmonary fibrosis, and frequent falls, now presenting to emergency department for evaluation of right hip pain after fall  HPI/Recap of past 24 hours:   Patient is seen after surgery, he denies acute complaints, wife is at bedside   Assessment/Plan: Principal Problem:   Closed right hip fracture, initial encounter [facility]) Active Problems:   Atrial fibrillation ([place])   Postinflammatory pulmonary fibrosis (HCC)   Thrombocytopenia (HCC)   Anemia, chronic disease   Chronic right-sided CHF (congestive heart failure) (HCC)   Chronic diastolic heart failure ([place])   Coronary artery disease involving native heart without angina pectoris   Closed intertrochanteric fracture of right hip, initial encounter ([place])   Osteomyelitis of second toe of right foot ([place])  Acute right intertrochanteric hip fracture with h/o frequent falls -patient lost balance and fell into wall.  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 Saintclair [name].  bp low normal, will decrease betablocker dose, give 500cc fluids bolus   chronic right sided CHF,  Per his cardiologist Dr [name] "His right-sided congestive heart failure is felt secondary to a combination of pulmonary venous hypertension, pulmonary fibrosis and sleep apnea. Currently euvolemic to dry , no edema, cxr no acute findings , bp low normal, continue hold diuretic, 500cc fluids bolusx1 on 2/18 and on 2/19 Continue hold Home meds demadex , plan to resume diuretics in 1-2 days.  Close monitor volume status  severe tricuspid regurgitation- Per cardiology "likely secondary to right ventricular enlargement related to pulmonary hypertension versus trauma from pacemaker.  Patient is not a surgical candidate [name] multiple comorbidities."  H/o CAD s/p CABG with modified Cox Maze IV procedure in 12/2007. Denies chest pain  H/o mild to moderate pulmonary fibrosis on CT chest in 07/2019. Pulmonary function test November 2020 normal.   chronic Anemia, thrombocytopenia  - he is followed  By Dr [name] - Type and screen was performed in ED -monitor CBC  CKD stage IIIa  - SCr is 1.37 in ED, similar to priors  - Renally-dose medications, monitor   BPH: increase flomax from daily to bid , bladder scan qshift for now  OSA on nightly cpap chronically  DVT Prophylaxis: scd's, lovenox  Code Status: full  Family Communication: wife at bedside   Disposition Plan:    Patient came from:        home  Anticipated d/c place: Pt recommended CIR, wife states patient greatly benefited from CIR last year, wife if happy that CIR is recommended again this time  Barriers to d/c OR conditions which need to be met to effect a safe d/c:  needs toe  ortho clearance/CIR eval   Consultants:  Dr [name] ( right hip)  Dr Sharol [name] (right toe)  Procedures: INTRAMEDULLARY (IM) NAIL INTERTROCHANTRIC on 2/17 by Dr [name] Right toe amputation  on 2/19 by Dr Sharol [name]  Antibiotics:  Perioperative per ortho   Objective: BP 99/60 (BP Location: Left Arm)   Pulse (!) 117   Temp 98.3 F (36.8 C)   Resp 17   Ht _0  (1.702 m)   Wt 81.1 kg   SpO2 93%   BMI 28.00 kg/m   Intake/Output Summary (Last 24 hours) at 02/06/2020 1340 Last data filed at 02/06/2020 1225 Gross per 24 hour  Intake 950 ml  Output 550 ml  Net 400 ml   Filed Weights   02/04/20 0950 02/06/20 1016  Weight: 81.1 kg 81.1 kg    Exam: Patient is examined daily including today on 02/06/2020, exams remain the same as of yesterday except that has changed    General:  Very pleasant, NAD, wearing a neck collar, report has been on chronically at home  Cardiovascular: IRRR  Respiratory: CTABL  Abdomen: Soft/ND/NT, positive BS  Musculoskeletal: No Edema, skin tear to right elvow,  right hip post op changes, s/p  right second  toe amputation, dressing in place  Neuro: very pleasant, alert, oriented to place, person and situation, slightly confused about the year, able to redirected, (per wife, this is baseline )      Data Reviewed: Basic Metabolic Panel: Recent Labs  Lab 02/03/20 1211 02/04/20 0040 02/04/20 0418 02/05/20 0203 02/06/20 0509  NA 142 139 141 140 138  K 4.5 4.4 3.9 4.3 3.9  CL 102 103 101 103 102  CO2 _1 GLUCOSE 81 102* 104* 126* 99  BUN 23 23 24* 23 37*  CREATININE 1.18 1.37* 1.30* 1.24 1.41*  CALCIUM 9.3 9.3 9.4 8.9 8.4*  MG  --   --   --   --  2.0   Liver Function Tests: No results for input(s): AST, ALT, ALKPHOS, BILITOT, PROT, ALBUMIN in the last 168 hours. No results for input(s): LIPASE, AMYLASE in the last 168 hours. No results for input(s): AMMONIA in the last 168 hours. CBC: Recent Labs  Lab 02/04/20 0040  02/04/20 0418 02/05/20 0203 02/06/20 0509  WBC 5.3 6.1 6.2 5.4  HGB 12.0* 11.2* 11.2* 10.2*  HCT 37.5* 34.0* 34.1* 31.4*  MCV 96.2 93.4 94.5 94.6  PLT 125* 111* 97* 87*   Cardiac Enzymes:   No results for input(s): CKTOTAL, CKMB, CKMBINDEX, TROPONINI in the last 168 hours. BNP (last 3 results) Recent Labs    05/16/19 0609  BNP 289.1*    ProBNP (last 3 results) No results for input(s): PROBNP in the last 8760 hours.  CBG: Recent Labs  Lab 02/04/20 1751 02/06/20 0952 02/06/20 1301  GLUCAP 98 85 93    Recent Results (from the past 240 hour(s))  Respiratory Panel by RT PCR (Flu A&B, Covid) - Nasopharyngeal Swab     Status: None   Collection Time: 02/04/20 12:44 AM   Specimen: Nasopharyngeal Swab  Result Value Ref Range Status   SARS Coronavirus 2 by RT PCR NEGATIVE NEGATIVE Final    Comment: (NOTE) SARS-CoV-2 target nucleic acids are NOT  DETECTED. The SARS-CoV-2 RNA is generally detectable in upper respiratoy specimens during the acute phase of infection. The lowest concentration of SARS-CoV-2 viral copies this assay can detect is 131 copies/mL. A negative result does not preclude SARS-Cov-2 infection and should not be used as the sole basis for treatment or other patient management decisions. A negative result may occur with  improper specimen collection/handling, submission of specimen other than nasopharyngeal swab, presence of viral mutation(s) within the areas targeted by this assay, and inadequate number of viral copies (<131 copies/mL). A negative result must be combined with clinical observations, patient history, and epidemiological information. The expected result is Negative. Fact Sheet for Patients:  PinkCheek.be Fact Sheet for Healthcare Providers:  GravelBags.it This test is not yet ap proved or cleared by the [place] FDA and  has been authorized for detection and/or diagnosis of  SARS-CoV-2 by FDA under an Emergency Use Authorization (EUA). This EUA will remain  in effect (meaning this test can be used) for the duration of the COVID-19 declaration under Section 564(b)(1) of the Act, 21 U.S.C. section 360bbb-3(b)(1), unless the authorization is terminated or revoked sooner.    Influenza A by PCR NEGATIVE NEGATIVE Final   Influenza B by PCR NEGATIVE NEGATIVE Final    Comment: (NOTE) The Xpert Xpress SARS-CoV-2/FLU/RSV assay is intended as an aid in  the diagnosis of influenza from Nasopharyngeal swab specimens and  should not be used as a sole basis for treatment. Nasal washings and  aspirates are unacceptable for Xpert Xpress SARS-CoV-2/FLU/RSV  testing. Fact Sheet for Patients: PinkCheek.be Fact Sheet for Healthcare Providers: GravelBags.it This test is not yet approved or cleared by the [place] FDA and  has been authorized for detection and/or diagnosis of SARS-CoV-2 by  FDA under an Emergency Use Authorization (EUA). This EUA will remain  in effect (meaning this test can be used) for the duration of the  Covid-19 declaration under Section 564(b)(1) of the Act, 21  U.S.C. section 360bbb-3(b)(1), unless the authorization is  terminated or revoked. Performed at [facility], [place] [address], [place], [place] [address]   Surgical pcr screen     Status: Abnormal   Collection Time: 02/04/20 10:03 AM   Specimen: Nasal Mucosa; Nasal Swab  Result Value Ref Range Status   MRSA, PCR NEGATIVE NEGATIVE Final   Staphylococcus aureus POSITIVE (A) NEGATIVE Final    Comment: (NOTE) The Xpert SA Assay (FDA approved for NASAL specimens in patients 48 years of age and older), is one component of a comprehensive surveillance program. It is not intended to diagnose infection nor to guide or monitor treatment. Performed at [facility], [place] [address], [place], [place] [address]       Studies: No results found.  Scheduled Meds: . chlorhexidine  15 mL Mouth Rinse BID  . Chlorhexidine Gluconate Cloth  6 each Topical Daily  . docusate sodium  100 mg Oral BID  . enoxaparin (LOVENOX) injection  40 mg Subcutaneous Q24H  . feeding supplement (ENSURE ENLIVE)  237 mL Oral QHS  . ferrous sulfate  325 mg Oral Q breakfast  . HYDROmorphone      . mouth rinse  15 mL Mouth Rinse q12n4p  . metoprolol tartrate  12.5 mg Oral BID  . mirabegron ER  50 mg Oral Daily  . multivitamin with minerals  1 tablet Oral Daily  . mupirocin ointment  1 application Nasal BID  . nutrition supplement (JUVEN)  1 packet Oral BID BM  .  pantoprazole  40 mg Oral Daily  . rosuvastatin  40 mg Oral QPM  . senna-docusate  1 tablet Oral QHS  . sertraline  200 mg Oral Daily  . tamsulosin  0.4 mg Oral QHS    Continuous Infusions: . sodium chloride    .  ceFAZolin (ANCEF) IV    . methocarbamol (ROBAXIN) IV    . sodium chloride       Time spent: 23mns I have personally reviewed and interpreted on  02/06/2020 daily labs, tele strips, imagings as discussed above under date review session and assessment and plans.  I reviewed all nursing notes, pharmacy notes, consultant notes,  vitals, pertinent old records  I have discussed plan of care as described above with RN , patient and family on 02/06/2020   FFlorencia ReasonsMD, PhD, FACP  [facility]  Available via Epic secure chat 7am-7pm for nonurgent issues Please page for urgent issues, pager number available through [email or web address] .   02/06/2020, 1:40 PM  LOS: 2 days

## 2020-02-06 NOTE — PMR Pre-admission (Signed)
PMR Admission Coordinator Pre-Admission Assessment  Patient: [name]. is an 79 y.o., male MRN: [id number] DOB: 22-Sep-1941 Height: '5\' 7"'  (170.2 cm) Weight: 81.1 kg  Insurance Information HMO:     PPO:      PCP:      IPA:      80/20:      OTHER:  PRIMARY: Medicare a and b      Policy#: [id number]      Subscriber: pt Benefits:  Phone #: passport one online     Name: 2/19 Eff. Date: a 12/19/2007 and b 1/1/ 2012     Deduct: $1484      Out of Pocket Max: none      Life Max: none CIR: 100%      SNF: 20 full days Outpatient: 80%     Co-Pay: 20% Home Health: 100%      Co-Pay: none DME: 80%     Co-Pay: 20% Providers: pt choice  SECONDARY: AARP supplement      Policy#: [id number]      Subscriber: pt  Medicaid Application Date:       Case Manager:  Disability Application Date:       Case Worker:   The "Data Collection Information Summary" for patients in Inpatient Rehabilitation Facilities with attached "Privacy Act [place] Records" was provided and verbally reviewed with: Patient and Family  Emergency Contact Information Contact Information    Name Relation Home Work Mobile   [place],Vickie Spouse [phone]  [place] Daughter   [phone]      Current Medical History  Patient Admitting Diagnosis: fall; debility with right femur fracture  History of Present Illness:  79 year old right-handed male with history of chronic diastolic congestive heart failure atrial fibrillation on Coumadin in the past discontinued 2014 due to 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  Patient received inpatient rehab service in June 2020 for debilitation related to CHF.    Presented 02/04/2020 after ground-level fall without loss of consciousness sustaining a right intertrochanteric femur fracture  as well as undisplaced fracture involving the lamina bilaterally at C6.[name]  Underwent closed intramedullary nailing of right femur fracture 02/04/2020 per Dr. [name] as well as findings of osteomyelitis right second toe with amputation 02/06/2020 per Dr. [name].  Patient is partial weightbearing 25% right lower extremity.  Patient was placed conservatively in a cervical collar for undisplaced fracture lamina of C6.  Hospital course acute blood loss anemia 10.2.  Maintained on Lovenox for DVT prophylaxis.    Some orthostasis with PAF with sick sinus dysfunction with history of pacemaker. Not on anticoagulation due to history of spontaneous SDH in 2013. B Blocker dosage adjusted over the weekend in concern for low normal BP. 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 BP noted to stable around systolic 272Z and HR in 36U . Chronic right sided CHF. Diuretics of Demedex and Lasix held in concern for low BP. He has periodically received 500 cc fluid boluses due to soft BP. To resume diuretics as needed use possibly based on BP tolerance.    Patient's medical record from [facility] has been reviewed by the rehabilitation admission coordinator and physician.  Past Medical History  Past Medical History:  Diagnosis Date  . (HFpEF) heart failure with preserved ejection fraction ([place])    a. 05/2013 Echo: EF 55%, mild LVH, diast dysfxn, Ao sclerosis, mildly dil  LA, RV dysfxn (poorly visualized), PASP 29mHg;  b. 03/2017 Echo: EF 55-60%, no rwma, triv MR, mildly dil RV, mod TR, PASP 422mg.  . [facility]   [facility];  Multaq Rx d/c'd in 2014 due to pulmo fibrosis;  coumadin d/c'd in 2014 due to spontaneous subdural hematoma  . BPH (benign prostatic hyperplasia)   . CAD (coronary artery disease), native coronary artery    a. s/p CABG 12/2007;  b. Myoview 12/2011: EF 66%, no scar or ischemia; normal.  . DM (diabetes mellitus) ([place]  . Hyperlipidemia type II   . Hypertension   . 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 OSA (obstructive sleep apnea)   . Pacemaker    PPM - St[employer] Jude  . Peripheral neuropathy 07/31/2018  . Pulmonary fibrosis ([place]   Multaq d/c'd 7/14  . [place] ([place]   spontaneous;  coumadin d/c'd => no longer a candidate for anticoagulation    Family History   family history includes Alzheimer's disease in his mother; Dementia in his mother; Heart attack in his father; Heart disease in his father and mother; Heart failure in his father.  Prior Rehab/Hospitalizations Has the patient had prior rehab or hospitalizations prior to admission? Yes  Has the patient had major surgery during 100 days prior to admission? Yes   Current Medications  Current Facility-Administered Medications:  .  acetaminophen (TYLENOL) tablet 325-650 mg, 325-650 mg, Oral, Q6H PRN, Persons, MaBevely PalmerPA .  bisacodyl (DULCOLAX) suppository 10 mg, 10 mg, Rectal, Daily PRN, Persons, MaBevely PalmerPA .  chlorhexidine (PERIDEX) 0.12 % solution 15 mL, 15 mL, Mouth Rinse, BID, Persons, MaBevely PalmerPA, 15 mL at 02/09/20 0906 .  docusate sodium (COLACE) capsule 100 mg, 100 mg, Oral, BID, Persons, MaBevely PalmerPA, 100 mg at 02/09/20 0906 .  enoxaparin (LOVENOX) injection 40 mg, 40 mg, Subcutaneous, Q24H, Persons, MaBevely PalmerPA .  feeding supplement (ENSURE ENLIVE) (ENSURE ENLIVE) liquid 237 mL, 237 mL, Oral, QHS, Persons, MaBevely PalmerPA[place]237 mL at 02/08/20 2156 .  ferrous sulfate tablet 325 mg, 325 mg, Oral, Q breakfast, Persons, MaBevely PalmerPA[place]325 mg at 02/09/20 0905 .  HYDROcodone-acetaminophen (NORCO/VICODIN) 5-325 MG per tablet 1-2 tablet, 1-2 tablet, Oral, Q4H PRN, Persons, MaBevely PalmerPAU[place]2 tablet at 02/09/20 0905 .  HYDROmorphone (DILAUDID) injection 0.5 mg, 0.5 mg, Intravenous, Q4H PRN, Persons, MaBevely PalmerPA .  MEDLINE mouth rinse, 15 mL, Mouth Rinse, q12n4p, Persons, MaBevely PalmerPA, 15 mL at 02/08/20 1723 .  menthol-cetylpyridinium (CEPACOL) lozenge 3  mg, 1 lozenge, Oral, PRN **OR** phenol (CHLORASEPTIC) mouth spray 1 spray, 1 spray, Mouth/Throat, PRN, Persons, MaBevely PalmerPA .  methocarbamol (ROBAXIN) tablet 500 mg, 500 mg, Oral, Q6H PRN, 500 mg at 02/08/20 1524 **OR** methocarbamol (ROBAXIN) 500 mg in dextrose 5 % 50 mL IVPB, 500 mg, Intravenous, Q6H PRN, Persons, MaBevely PalmerPA .  metoCLOPramide (REGLAN) tablet 5-10 mg, 5-10 mg, Oral, Q8H PRN **OR** metoCLOPramide (REGLAN) injection 5-10 mg, 5-10 mg, Intravenous, Q8H PRN, Persons, MaBevely PalmerPA .  metoprolol succinate (TOPROL-XL) 24 hr tablet 12.5 mg, 12.5 mg, Oral, Daily, Kamineni, Neelima, MD .  mirabegron ER (MYRBETRIQ) tablet 50 mg, 50 mg, Oral, Daily, Persons, MaBevely PalmerPA, 50 mg at 02/09/20 0906 .  morphine 2 MG/ML injection 1-2 mg, 1-2 mg, Intravenous, Q3H PRN, Persons, MaBevely PalmerPA, 1 mg at 02/05/20 1406 .  multivitamin with minerals tablet 1  tablet, 1 tablet, Oral, Daily, Persons, Bevely Palmer, [place], 1 tablet at 02/09/20 0906 .  nutrition supplement (JUVEN) (JUVEN) powder packet 1 packet, 1 packet, Oral, BID BM, Persons, Bevely Palmer, [place], 1 packet at 02/09/20 [phone] .  ondansetron (ZOFRAN) injection 4 mg, 4 mg, Intravenous, Q6H PRN, Persons, Bevely Palmer, PA .  ondansetron (ZOFRAN) tablet 4 mg, 4 mg, Oral, Q6H PRN **OR** ondansetron (ZOFRAN) injection 4 mg, 4 mg, Intravenous, Q6H PRN, Persons, Bevely Palmer, PA .  pantoprazole (PROTONIX) EC tablet 40 mg, 40 mg, Oral, Daily, Persons, Bevely Palmer, [place], 40 mg at 02/09/20 0906 .  polyethylene glycol (MIRALAX / GLYCOLAX) packet 17 g, 17 g, Oral, Daily PRN, Persons, Bevely Palmer, PA .  potassium chloride SA (KLOR-CON) CR tablet 20 mEq, 20 mEq, Oral, Daily, Florencia Reasons, MD, 20 mEq at 02/09/20 0906 .  rosuvastatin (CRESTOR) tablet 40 mg, 40 mg, Oral, QPM, Persons, Bevely Palmer, PA, 40 mg at 02/08/20 1722 .  senna-docusate (Senokot-S) tablet 1 tablet, 1 tablet, Oral, QHS PRN, Persons, Bevely Palmer, [place], 1 tablet at 02/06/20 2139 .  senna-docusate (Senokot-S) tablet 1 tablet, 1  tablet, Oral, QHS, Persons, Bevely Palmer, [place], 1 tablet at 02/08/20 2156 .  sertraline (ZOLOFT) tablet 200 mg, 200 mg, Oral, Daily, Persons, Bevely Palmer, [place], 200 mg at 02/09/20 0906 .  tamsulosin (FLOMAX) capsule 0.4 mg, 0.4 mg, Oral, QHS, Florencia Reasons, MD, 0.4 mg at 02/08/20 2155  Patients Current Diet:  Diet Order            Diet Carb Modified Fluid consistency: Thin; Room service appropriate? Yes  Diet effective now              Precautions / Restrictions Precautions Precautions: Fall Restrictions Weight Bearing Restrictions: Yes RLE Weight Bearing: Touchdown weight bearing RLE Partial Weight Bearing Percentage or Pounds: 25%   Has the patient had 2 or more falls or a fall with injury in the past year? Yes  Prior Activity Level Limited Community (1-2x/wk): Mod I with SPC but alot of falls  Prior Functional Level Self Care: Did the patient need help bathing, dressing, using the toilet or eating? Needed some help  Indoor Mobility: Did the patient need assistance with walking from room to room (with or without device)? Independent  Stairs: Did the patient need assistance with internal or external stairs (with or without device)? Independent  Functional Cognition: Did the patient need help planning regular tasks such as shopping or remembering to take medications? Needed some help  Home Assistive Devices / [place] Devices/Equipment: Cane (specify quad or straight), Walker (specify type), Hearing aid Home Equipment: Walker - 2 wheels, Wheelchair - manual, Bedside commode, Shower seat - built in, [place] - single point  Prior Device Use: Indicate devices/aids used by the patient prior to current illness, exacerbation or injury? spc  Current Functional Level Cognition  Overall Cognitive Status: Within Functional Limits for tasks assessed Orientation Level: Oriented X4 General Comments: noted hx of cognitive deficits in chart, pt was appropriate throughout session without  deficits noted. will continue to assess    Extremity Assessment (includes Sensation/Coordination)  Upper Extremity Assessment: Generalized weakness  Lower Extremity Assessment: Defer to PT evaluation RLE Deficits / Details: decreased ROM and strength post op    ADLs  Overall ADL's : Needs assistance/impaired Eating/Feeding: Set up, Sitting Grooming: Minimal assistance, Cueing for safety, Standing, Oral care Grooming Details (indicate cue type and reason): standing at sink with min A +2; +2 needed to maintain WB precaution  with therapist foot under pt foot. Pt needing external support from hanging onto counter Upper Body Bathing: Minimal assistance, Sitting Lower Body Bathing: Sitting/lateral leans, Sit to/from stand, Maximal assistance Upper Body Dressing : Moderate assistance, Sitting Lower Body Dressing: Maximal assistance, Sitting/lateral leans, Sit to/from stand Toilet Transfer: Minimal assistance, +2 for physical assistance, +2 for safety/equipment, RW Toileting- Clothing Manipulation and Hygiene: Moderate assistance, Maximal assistance, Sit to/from stand Functional mobility during ADLs: Minimal assistance, +2 for physical assistance, +2 for safety/equipment, Rolling walker General ADL Comments: pt limited by RLE pain and precautions    Mobility  Overal bed mobility: Needs Assistance Bed Mobility: Supine to Sit Supine to sit: Mod assist, +2 for physical assistance, HOB elevated General bed mobility comments: HOB 30 degrees with assist to move RLE and elevate trunk, increased time and effort    Transfers  Overall transfer level: Needs assistance Equipment used: Rolling walker (2 wheeled) Transfers: Sit to/from Stand Sit to Stand: Min assist, +2 physical assistance General transfer comment: cues for safe hand placement; therapist placed foor under pt foot to monitor PWB    Ambulation / Gait / Stairs / Wheelchair Mobility  Ambulation/Gait Ambulation/Gait assistance: Herbalist (Feet): 18 Feet Assistive device: Rolling walker (2 wheeled) Gait Pattern/deviations: Step-to pattern, Decreased stride length General Gait Details: pt maintaining NWB RLE with gait this session with pt able to hop with short hops in room to limit strain to cervical spine and increase distance. Returned to bed for pending sx Gait velocity interpretation: <1.8 ft/sec, indicate of risk for recurrent falls    Posture / Balance Dynamic Sitting Balance Sitting balance - Comments: pt able to sit EOB without assist Balance Overall balance assessment: History of Falls, Needs assistance Sitting balance-Leahy Scale: Fair Sitting balance - Comments: pt able to sit EOB without assist Standing balance-Leahy Scale: Fair Standing balance comment: pt able to stand on LLE at sink with and without UE support with TDWB on RLE, RW for gait    Special needs/care consideration BiPAP/CPAP  Yes CPAP pta. Has not been using CPAP while hospitalized due to he does not like the fit of the hospital CPAP CPM  Continuous Drip IV  Dialysis         Life Vest  Oxygen  Special Bed  Trach Size  Wound Vac  Skin surgical sites for toe and for hip surgery Bowel mgmt:  continent Bladder mgmt: external catheter Diabetic mgmt:  Behavioral consideration  Chemo/radiation  Designated visitor is wife, Vickie   Previous Home Environment  Living Arrangements: Spouse/significant other  Lives With: Spouse Available Help at Discharge: Family, Available 24 hours/day Type of Home: [place]: One level Home Access: Stairs to enter Entrance Stairs-Rails: Right, Left, Can reach both Entrance Stairs-Number of Steps: 3 Bathroom Shower/Tub: Gaffer, Charity fundraiser: Standard Bathroom Accessibility: Yes How Accessible: Accessible via walker [place]: (has been to [address] last year) Additional Comments: currently in aspen collar from previous cervical  injury  Discharge Living Setting Plans for Discharge Living Setting: Patient's home, Lives with (comment)(wife) Type of Home at Discharge: House Discharge Home Layout: One level Discharge Home Access: Stairs to enter Entrance Stairs-Rails: Right, Left, Can reach both Entrance Stairs-Number of Steps: 3 Discharge Bathroom Shower/Tub: Walk-in shower, Door Discharge Bathroom Toilet: Standard Discharge Bathroom Accessibility: Yes How Accessible: Accessible via walker Does the patient have any problems obtaining your medications?: No  Social/Family/Support Systems Patient Roles: Spouse, Parent Contact Information: wife, Vickie Anticipated Caregiver:  wife Anticipated Caregiver's Contact Information: see above Ability/Limitations of Caregiver: superivison to min assist level Caregiver Availability: 24/7 Discharge Plan Discussed with Primary Caregiver: Yes Is Caregiver In Agreement with Plan?: No Does Caregiver/Family have Issues with Lodging/Transportation while Pt is in Rehab?: No  Goals/Additional Needs Patient/Family Goal for Rehab: supervision to min asisst with PT and OT Expected length of stay: ELOS 2 weeks Pt/Family Agrees to Admission and willing to participate: Yes Program Orientation Provided & Reviewed with Pt/Caregiver Including Roles  & Responsibilities: Yes  Decrease burden of Care through IP rehab admission:   Possible need for SNF placement upon discharge:   Patient Condition: I have reviewed medical records from [facility] , spoken with  patient and spouse. I met with patient at the bedside for inpatient rehabilitation assessment.  Patient will benefit from ongoing PT and OT, can actively participate in 3 hours of therapy a day 5 days of the week, and can make measurable gains during the admission.  Patient will also benefit from the coordinated team approach during an Inpatient Acute Rehabilitation admission.  The patient will receive intensive therapy as well  as Rehabilitation physician, nursing, social worker, and care management interventions.  Due to bladder management, bowel management, safety, skin/wound care, disease management, medication administration, pain management and patient education the patient requires 24 hour a day rehabilitation nursing.  The patient is currently mod assist with mobility and basic ADLs.  Discharge setting and therapy post discharge at home with home health is anticipated.  Patient has agreed to participate in the Acute Inpatient Rehabilitation Program and will admit today.  Preadmission Screen Completed By:  Cleatrice Burke, 02/09/2020 12:16 PM ______________________________________________________________________   Discussed status with Dr. Letta Pate on  02/09/2020 at  1216 and received approval for admission today.  Admission Coordinator:  Cleatrice Burke, RN, time  6720 Date 02/09/2020  Assessment/Plan: Diagnosis: Right intertrochanteric femur fracture due to fall 02/04/2020, status post IM nail with partial weightbearing 1. Does the need for close, 24 hr/day Medical supervision in concert with the patient's rehab needs make it unreasonable for this patient to be served in a less intensive setting? Yes 2. Co-Morbidities requiring supervision/potential complications: History of stroke, history of diastolic congestive heart failure, history of atrial fibrillation 3. Due to bladder management, bowel management, safety, skin/wound care, disease management, medication administration, pain management and patient education, does the patient require 24 hr/day rehab nursing? Yes 4. Does the patient require coordinated care of a physician, rehab nurse, PT, OT, and SLP to address physical and functional deficits in the context of the above medical diagnosis(es)? Yes Addressing deficits in the following areas: balance, endurance, locomotion, strength, transferring, bowel/bladder control, bathing, dressing, feeding,  grooming, toileting and psychosocial support 5. Can the patient actively participate in an intensive therapy program of at least 3 hrs of therapy 5 days a week? Yes 6. The potential for patient to make measurable gains while on inpatient rehab is good 7. Anticipated functional outcomes upon discharge from inpatient rehab: supervision PT, supervision and min assist OT, n/a SLP 8. Estimated rehab length of stay to reach the above functional goals is: 14 to 16 days 9. Anticipated discharge destination: Home 10. Overall Rehab/Functional Prognosis: good   MD Signature: "I have personally performed a face to face diagnostic evaluation of this patient.  Additionally, I have reviewed and concur with the physician assistant's documentation above."

## 2020-02-06 NOTE — Progress Notes (Addendum)
BP 95/62 to 88/51, telemetry tachy/brady, HR 70 with demand pacing to HR 150's for brief periods.  Order from [facility] to hold 2200 dose metoprolol and give 586ml NS bolus.  Patient alert and feels fine, denies CP/dyspnea/discomfort.  Lungs clear.

## 2020-02-06 NOTE — Plan of Care (Signed)
  Problem: Clinical Measurements: Goal: Will remain free from infection Outcome: Progressing Goal: Cardiovascular complication will be avoided Outcome: Progressing   Problem: Activity: Goal: Risk for activity intolerance will decrease Outcome: Progressing   Problem: Coping: Goal: Level of anxiety will decrease Outcome: Progressing   Problem: Pain Managment: Goal: General experience of comfort will improve Outcome: Progressing   Problem: Safety: Goal: Ability to remain free from injury will improve Outcome: Progressing   Problem: Skin Integrity: Goal: Risk for impaired skin integrity will decrease Outcome: Progressing

## 2020-02-06 NOTE — Social Work (Signed)
CSW acknowledging consult for HH/DME/SNF placement. Will follow for therapy recommendations needed to best determine disposition/for insurance authorization. At this time pt rec for CIR, consult placed. Await their evaluation, will pursue alternate disposition if pt not appropriate for CIR.   Westley Hummer, MSW, [place] Work

## 2020-02-06 NOTE — Plan of Care (Signed)

## 2020-02-06 NOTE — Transfer of Care (Signed)
Immediate Anesthesia Transfer of Care Note  Patient: [name].  Procedure(s) Performed: AMPUTATION TOE (Right Toe)  Patient Location: PACU  Anesthesia Type:MAC  Level of Consciousness: awake, alert  and oriented  Airway & Oxygen Therapy: Patient Spontanous Breathing  Post-op Assessment: Report given to RN, Post -op Vital signs reviewed and stable and Patient moving all extremities  Post vital signs: Reviewed and stable  Last Vitals:  Vitals Value Taken Time  BP 90/59 02/06/20 1214  Temp 36.8 C 02/06/20 1214  Pulse 116 02/06/20 1220  Resp 18 02/06/20 1220  SpO2 97 % 02/06/20 1220  Vitals shown include unvalidated device data.  Last Pain:  Vitals:   02/06/20 1016  TempSrc:   PainSc: 0-No pain      Patients Stated Pain Goal: 3 (78/67/54 4920)  Complications: No apparent anesthesia complications

## 2020-02-06 NOTE — Anesthesia Preprocedure Evaluation (Signed)
Anesthesia Evaluation  Patient identified by MRN, date of birth, ID band Patient awake    Reviewed: Allergy & Precautions, NPO status , Patient's Chart, lab work & pertinent test results  Airway Mallampati: II  TM Distance: >3 FB Neck ROM: Limited   Comment: Patient in C collar from previous fall. Can extend and flex neck without collar on.  CT done today show no cervical fractures or subluxation of the cervical spine Dental no notable dental hx.    Pulmonary sleep apnea , former smoker,  Pulmonary HTN   Pulmonary exam normal breath sounds clear to auscultation       Cardiovascular hypertension, + CAD, + CABG and + DOE  + dysrhythmias Atrial Fibrillation + pacemaker + Valvular Problems/Murmurs MR  Rhythm:Regular Rate:Tachycardia     Neuro/Psych negative neurological ROS  negative psych ROS   GI/Hepatic negative GI ROS, Neg liver ROS,   Endo/Other  diabetes  Renal/GU negative Renal ROS  negative genitourinary   Musculoskeletal negative musculoskeletal ROS (+)   Abdominal   Peds negative pediatric ROS (+)  Hematology  (+) anemia ,   Anesthesia Other Findings   Reproductive/Obstetrics negative OB ROS                             Anesthesia Physical  Anesthesia Plan  ASA: III  Anesthesia Plan: General   Post-op Pain Management:    Induction: Intravenous  PONV Risk Score and Plan: 2 and Ondansetron, Treatment may vary due to age or medical condition and Midazolam  Airway Management Planned: LMA  Additional Equipment:   Intra-op Plan:   Post-operative Plan: Extubation in OR  Informed Consent: I have reviewed the patients History and Physical, chart, labs and discussed the procedure including the risks, benefits and alternatives for the proposed anesthesia with the patient or authorized representative who has indicated his/her understanding and acceptance.     Dental advisory  given  Plan Discussed with: CRNA and Surgeon  Anesthesia Plan Comments:         Anesthesia Quick Evaluation

## 2020-02-06 NOTE — Progress Notes (Signed)
Physical Therapy Treatment Patient Details Name: [name]. MRN: [id number] DOB: 02/01/1941 Today's Date: 02/06/2020    History of Present Illness 79 yo admitted after fall with left femur fx s/p IM nail. Planned Rt 2nd toe amp 2/19. Pt in cervical collar for healing fx. 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 Pt continues to struggle with transitional movements and HEP but was able to improve gait distance today. Pt educated for HEP and progression with plan for sx later today and pt able to maintain even NWB during gait this session but needs assist to maintain weight off of RLE with sit<>stand. Will continue to follow.     Follow Up Recommendations  CIR;Supervision for mobility/OOB     Equipment Recommendations  None recommended by PT    Recommendations for Other Services       Precautions / Restrictions Precautions Precautions: Fall Restrictions Weight Bearing Restrictions: Yes RLE Weight Bearing: Partial weight bearing RLE Partial Weight Bearing Percentage or Pounds: 25%    Mobility  Bed Mobility Overal bed mobility: Needs Assistance Bed Mobility: Supine to Sit     Supine to sit: Mod assist;+2 for physical assistance;HOB elevated     General bed mobility comments: HOB 30 degrees with assist to move RLE and elevate trunk, increased time and effort  Transfers Overall transfer level: Needs assistance   Transfers: Sit to/from Stand Sit to Stand: Min assist;+2 physical assistance         General transfer comment: cues for safe hand placement; therapist placed foor under pt foot to monitor PWB  Ambulation/Gait Ambulation/Gait assistance: Min assist Gait Distance (Feet): 18 Feet Assistive device: Rolling walker (2 wheeled) Gait Pattern/deviations: Step-to pattern;Decreased stride length   Gait velocity interpretation: <1.8  ft/sec, indicate of risk for recurrent falls General Gait Details: pt maintaining NWB RLE with gait this session with pt able to hop with short hops in room to limit strain to cervical spine and increase distance. Returned to bed for pending sx   Stairs             Wheelchair Mobility    Modified Rankin (Stroke Patients Only)       Balance Overall balance assessment: History of Falls;Needs assistance                                          Cognition Arousal/Alertness: Awake/alert Behavior During Therapy: WFL for tasks assessed/performed Overall Cognitive Status: Within Functional Limits for tasks assessed                                        Exercises General Exercises - Lower Extremity Long Arc Quad: AAROM;Right;Seated;10 reps Hip Flexion/Marching: AAROM;Right;Seated;10 reps    General Comments        Pertinent Vitals/Pain Pain Score: 6  Pain Location: right hip Pain Descriptors / Indicators: Aching;Guarding;Tender Pain Intervention(s): Limited activity within patient's tolerance;Monitored during session;Repositioned;Premedicated before session    Home Living                      Prior Function            PT Goals (current goals  can now be found in the care plan section) Progress towards PT goals: Progressing toward goals    Frequency    Min 5X/week      PT Plan Current plan remains appropriate    Co-evaluation              AM-PAC PT "6 Clicks" Mobility   Outcome Measure  Help needed turning from your back to your side while in a flat bed without using bedrails?: A Lot Help needed moving from lying on your back to sitting on the side of a flat bed without using bedrails?: A Lot Help needed moving to and from a bed to a chair (including a wheelchair)?: A Little Help needed standing up from a chair using your arms (e.g., wheelchair or bedside chair)?: A Little Help needed to walk in hospital  room?: A Little Help needed climbing 3-5 steps with a railing? : Total 6 Click Score: 14    End of Session Equipment Utilized During Treatment: Gait belt Activity Tolerance: Patient tolerated treatment well Patient left: in bed;with bed alarm set;with call bell/phone within reach;with nursing/sitter in room Nurse Communication: Mobility status;Precautions;Weight bearing status PT Visit Diagnosis: Other abnormalities of gait and mobility (R26.89);Difficulty in walking, not elsewhere classified (R26.2)     Time: [id number] PT Time Calculation (min) (ACUTE ONLY): 27 min  Charges:  $Gait Training: 8-22 mins $Therapeutic Exercise: 8-22 mins                     Evon Dejarnett P, PT Acute Rehabilitation Services Pager: [phone] Office: [place] 02/06/2020, 12:19 PM

## 2020-02-06 NOTE — Op Note (Signed)
02/06/2020  12:11 PM  PATIENT:  [name].    PRE-OPERATIVE DIAGNOSIS:  OSTEOMYLITIS RIGHT SECOND TOES  POST-OPERATIVE DIAGNOSIS:  Same  PROCEDURE:  AMPUTATION TOE  SURGEON:  Newt Minion, MD  PHYSICIAN ASSISTANT:None ANESTHESIA:   General  PREOPERATIVE INDICATIONS:  [name]. is a  79 y.o. male with a diagnosis of OSTEOMYLITIS RIGHT SECOND TOES who failed conservative measures and elected for surgical management.    The risks benefits and alternatives were discussed with the patient preoperatively including but not limited to the risks of infection, bleeding, nerve injury, cardiopulmonary complications, the need for revision surgery, among others, and the patient was willing to proceed.  OPERATIVE IMPLANTS: None.  _0 @  OPERATIVE FINDINGS: Minimal petechial bleeding at the MTP joint no abscess  OPERATIVE PROCEDURE: Patient was brought the operating room and underwent a MAC anesthetic.  The right lower extremity was then prepped using DuraPrep draped into a sterile field a timeout was called.  Patient underwent a digital block with 19 cc quarter percent Marcaine plain.  A the incision was made just distal to the MTP joint.  The second toe right foot was amputated through the MTP joint.  Wound was irrigated with normal saline electrocautery was used hemostasis the incision was closed using 2-0 nylon a sterile dressing was applied patient was taken to the PACU in stable condition.   DISCHARGE PLANNING:  Antibiotic duration: Continue antibiotics for 24 hours  Weightbearing: Touchdown weightbearing on the right  Pain medication: Opioid pathway  Dressing care/ Wound VAC: Keep dressing clean dry and intact  Ambulatory devices: Walker  Discharge to: Anticipate discharge to skilled nursing for the right hip fracture.  Follow-up: In the office 1 week post operative.

## 2020-02-06 NOTE — Anesthesia Postprocedure Evaluation (Signed)
Anesthesia Post Note  Patient: [name].  Procedure(s) Performed: AMPUTATION TOE (Right Toe)     Patient location during evaluation: PACU Anesthesia Type: General Level of consciousness: awake and alert Pain management: pain level controlled Vital Signs Assessment: post-procedure vital signs reviewed and stable Respiratory status: spontaneous breathing, nonlabored ventilation and respiratory function stable Cardiovascular status: blood pressure returned to baseline and stable Postop Assessment: no apparent nausea or vomiting Anesthetic complications: no    Last Vitals:  Vitals:   02/06/20 1317 02/06/20 1335  BP: (!) 105/57 99/60  Pulse: (!) 120 (!) 117  Resp: 16 17  Temp:    SpO2: 90% 93%    Last Pain:  Vitals:   02/06/20 1243  TempSrc:   PainSc: 0-No pain                 Lynda Rainwater

## 2020-02-06 NOTE — Interval H&P Note (Signed)
History and Physical Interval Note:  02/06/2020 7:58 AM  [name].  has presented today for surgery, with the diagnosis of OSTEOMYLITISRIGTH SECOND TOES.  The various methods of treatment have been discussed with the patient and family. After consideration of risks, benefits and other options for treatment, the patient has consented to  Procedure(s): AMPUTATION TOE (Right) as a surgical intervention.  The patient's history has been reviewed, patient examined, no change in status, stable for surgery.  I have reviewed the patient's chart and labs.  Questions were answered to the patient's satisfaction.     Newt Minion

## 2020-02-06 NOTE — Progress Notes (Signed)
Orthopedics Progress Note  Subjective: Doing well s/p right second toe amputation, no complaints  Objective:  Vitals:   02/06/20 1524 02/06/20 1530  BP:  102/68  Pulse: (!) 104 (!) 117  Resp:  18  Temp:  98.6 F (37 C)  SpO2:  100%    General: Awake and alert  Musculoskeletal: Right foot dressing CDI Right hip dressings CDI, no swelling and no pain with gentle PROM Neurovascularly intact  Lab Results  Component Value Date   WBC 5.4 02/06/2020   HGB 10.2 (L) 02/06/2020   HCT 31.4 (L) 02/06/2020   MCV 94.6 02/06/2020   PLT 87 (L) 02/06/2020       Component Value Date/Time   NA 138 02/06/2020 0509   NA 142 02/03/2020 1211   NA 141 02/23/2017 1237   K 3.9 02/06/2020 0509   K 3.8 02/23/2017 1237   CL 102 02/06/2020 0509   CO2 28 02/06/2020 0509   CO2 27 02/23/2017 1237   GLUCOSE 99 02/06/2020 0509   GLUCOSE 113 02/23/2017 1237   BUN 37 (H) 02/06/2020 0509   BUN 23 02/03/2020 1211   BUN 18.3 02/23/2017 1237   CREATININE 1.41 (H) 02/06/2020 0509   CREATININE 1.2 02/23/2017 1237   CALCIUM 8.4 (L) 02/06/2020 0509   CALCIUM 9.6 02/23/2017 1237   GFRNONAA 48 (L) 02/06/2020 0509   GFRAA 55 (L) 02/06/2020 0509    Lab Results  Component Value Date   INR 1.2 02/04/2020   INR 1.3 (H) 06/27/2019   INR 1.3 (H) 05/22/2019    Assessment/Plan: S/p IM Nail right IT hip fracture and as of today s/p second toe amputation Discharge planning DVT prophylaxis with Lovenox and mechanical  Follow up in two weeks in the office with Dr Esmond Plants R. [name], MD 02/06/2020 4:24 PM

## 2020-02-06 NOTE — Progress Notes (Signed)
Inpatient Rehabilitation Admissions Coordinator  Inpatient rehab consult received. I met with patient and his wife at bedside and discussed case with Dr. Naaman Plummer. W discussed goals and expectations of an inpt rehab admit. They were with [place] previously 5 2020 after CVA. They are in agreement. I have notified Dr. Erlinda Hong and Michiel Cowboy , [place]. Dr. Delice Lesch will see patient in the am, and if stable, 5 N RN can contact Rehab at [phone] to arrange admit after 12 noon.  Danne [place], RN, MSN Rehab Admissions Coordinator [phone] 02/06/2020 3:25 PM

## 2020-02-06 NOTE — Anesthesia Procedure Notes (Signed)
Procedure Name: MAC Date/Time: 02/06/2020 11:50 AM Performed by: Leonor Liv, CRNA Pre-anesthesia Checklist: Patient identified, Emergency Drugs available, Suction available, Patient being monitored and Timeout performed Patient Re-evaluated:Patient Re-evaluated prior to induction Oxygen Delivery Method: Simple face mask Placement Confirmation: positive ETCO2 Dental Injury: Teeth and Oropharynx as per pre-operative assessment

## 2020-02-07 LAB — BASIC METABOLIC PANEL
Anion gap: 8 (ref 5–15)
BUN: 33 mg/dL — ABNORMAL HIGH (ref 8–23)
CO2: 26 mmol/L (ref 22–32)
Calcium: 8.2 mg/dL — ABNORMAL LOW (ref 8.9–10.3)
Chloride: 102 mmol/L (ref 98–111)
Creatinine, Ser: 1.22 mg/dL (ref 0.61–1.24)
GFR calc Af Amer: >60 mL/min (ref >60)
GFR calc non Af Amer: 57 mL/min — ABNORMAL LOW (ref >60)
Glucose, Bld: 97 mg/dL (ref 70–99)
Potassium: 4.4 mmol/L (ref 3.5–5.1)
Sodium: 136 mmol/L (ref 135–145)

## 2020-02-07 LAB — CBC
HCT: 26.9 % — ABNORMAL LOW (ref 39.0–52.0)
Hemoglobin: 8.9 g/dL — ABNORMAL LOW (ref 13.0–17.0)
MCH: 31.2 pg (ref 26.0–34.0)
MCHC: 33.1 g/dL (ref 30.0–36.0)
MCV: 94.4 fL (ref 80.0–100.0)
Platelets: 68 K/uL — ABNORMAL LOW (ref 150–400)
RBC: 2.85 MIL/uL — ABNORMAL LOW (ref 4.22–5.81)
RDW: 15 % (ref 11.5–15.5)
WBC: 4.2 K/uL (ref 4.0–10.5)
nRBC: 0 % (ref 0.0–0.2)

## 2020-02-07 NOTE — Progress Notes (Signed)
PROGRESS NOTE  [name]. [id number] DOB: Oct 31, 1941 DOA: 02/03/2020 PCP: [name], MD  Brief summary:  [name]. is a 79 y.o. male with medical history significant for chronic diastolic and right-sided heart failure, atrial fibrillation status post Maze procedure no longer anticoagulated after spontaneous SDH, CAD status post CABG, symptomatic bradycardia now with pacer, chronic kidney disease stage IIIa, OSA on CPAP, chronic anemia and thrombocytopenia, pulmonary fibrosis, and frequent falls, now presenting to emergency department for evaluation of right hip pain after fall  HPI/Recap of past 24 hours:   He received 500cc fluids bolus for borderline bp, has intermittent tachy and paced rhythm overnight, he denies symptom, no chest pain, no sob, no dizziness  CIR placement on hold due to bp and heart rate issue   Assessment/Plan: Principal Problem:   Closed right hip fracture, initial encounter ([place]) Active Problems:   Atrial fibrillation (HCC)   Postinflammatory pulmonary fibrosis (HCC)   Thrombocytopenia (HCC)   Anemia, chronic disease   Chronic right-sided CHF (congestive heart failure) (HCC)   Chronic diastolic heart failure ([place])   Coronary artery disease involving native heart without angina pectoris   Closed intertrochanteric fracture of right hip, initial encounter ([place])   Osteomyelitis of second toe of right foot ([place])  Acute right intertrochanteric hip fracture with h/o frequent falls -patient lost balance and fell into wall.  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 Saintclair Halsted.  bp low normal,  betablocker dose decreased    chronic right sided CHF,  Per his cardiologist Dr [name] "His right-sided congestive heart failure is felt secondary to a combination of pulmonary venous hypertension, pulmonary fibrosis and sleep apnea. Currently euvolemic to dry , no edema, cxr no acute findings , bp low normal, continue hold diuretic, 500cc fluids bolusx1 on 2/18 and x2 on 2/19 Continue hold Home meds demadex , plan to resume diuretics in 1-2 days.  Close monitor volume status  severe tricuspid regurgitation- Per cardiology "likely secondary to right ventricular enlargement related to pulmonary hypertension versus trauma from pacemaker.  Patient is not a surgical candidate given multiple comorbidities."  H/o CAD s/p CABG with modified Cox Maze IV procedure in 12/2007. Denies chest pain  H/o mild to moderate pulmonary fibrosis on CT chest in 07/2019. Pulmonary function test November 2020 normal.   chronic Anemia, thrombocytopenia  - he is followed  By Dr [name] - Type and screen was performed in ED -monitor CBC  CKD stage IIIa  - SCr is 1.37 in ED, similar to priors  - Renally-dose medications, monitor   BPH:  increase flomax from daily to bid , bladder scan 35cc, good urine output  OSA on nightly cpap chronically  DVT Prophylaxis: scd's, lovenox ( he declined lovenox)  Code Status: full  Family Communication: wife at bedside   Disposition Plan:  Patient came from:        home                                                                                                  Anticipated d/c place:  CIR   Barriers to d/c OR conditions which  need to be met to effect a safe d/c: needs toe  Pending bp and heart rate improvement, CIR reeval on monday   Consultants:  Dr [name] ( right hip)  Dr [name] (right toe)  Procedures: INTRAMEDULLARY (IM) NAIL INTERTROCHANTRIC on 2/17 by Dr [name] Right toe amputation  on 2/19 by Dr [name]  Antibiotics:  Perioperative per ortho   Objective: BP 111/68 (BP Location: Left Arm)   Pulse (!) 112   Temp 99.4 F (37.4 C) (Oral)   Resp 18   Ht '5\' 7"'  (1.702 m)   Wt 81.1 kg   SpO2 92%   BMI 28.00 kg/m   Intake/Output Summary (Last 24 hours) at 02/07/2020 7262 Last data filed at 02/07/2020 0100 Gross per 24 hour  Intake 2528 ml  Output 1890 ml  Net 638 ml   Filed Weights   02/04/20 0950 02/06/20 1016  Weight: 81.1 kg 81.1 kg    Exam: Patient is examined daily including today on 02/07/2020, exams remain the same as of yesterday except that has changed    General:  Very pleasant, NAD, wearing a neck collar  Cardiovascular: IRRR  Respiratory: CTABL  Abdomen: Soft/ND/NT, positive BS  Musculoskeletal: No Edema, skin tear to right elvow,  right hip post op changes, s/p  right second  toe amputation, dressing in place  Neuro: very pleasant, alert, oriented to place, person and situation, slightly confused about the year, able to redirected, (per wife, this is baseline )      Data Reviewed: Basic Metabolic Panel: Recent Labs  Lab 02/04/20 0040 02/04/20 0418 02/05/20 0203 02/06/20 0509 02/07/20 0345  NA 139 141 140 138 136  K 4.4 3.9 4.3 3.9 4.4  CL 103 101 103 102 102  CO2 '28 27 26 28 26  ' GLUCOSE 102* 104* 126* 99 97  BUN 23 24* 23 37* 33*  CREATININE 1.37* 1.30* 1.24 1.41* 1.22  CALCIUM 9.3 9.4 8.9 8.4* 8.2*  MG  --   --   --  2.0  --    Liver Function Tests: No results for input(s): AST, ALT, ALKPHOS, BILITOT, PROT, ALBUMIN in the last 168 hours. No results for input(s): LIPASE, AMYLASE in the last 168 hours. No results for input(s): AMMONIA in the last 168  hours. CBC: Recent Labs  Lab 02/04/20 0040 02/04/20 0418 02/05/20 0203 02/06/20 0509 02/07/20 0345  WBC 5.3 6.1 6.2 5.4 4.2  HGB 12.0* 11.2* 11.2* 10.2* 8.9*  HCT 37.5* 34.0* 34.1* 31.4* 26.9*  MCV 96.2 93.4 94.5 94.6 94.4  PLT 125* 111* 97* 87* 68*   Cardiac Enzymes:   No results for input(s): CKTOTAL, CKMB, CKMBINDEX, TROPONINI in the last 168 hours. BNP (last 3 results) Recent Labs    05/16/19 0609  BNP 289.1*    ProBNP (last  3 results) No results for input(s): PROBNP in the last 8760 hours.  CBG: Recent Labs  Lab 02/04/20 1751 02/06/20 0952 02/06/20 1301  GLUCAP 98 85 93    Recent Results (from the past 240 hour(s))  Respiratory Panel by RT PCR (Flu A&B, Covid) - Nasopharyngeal Swab     Status: None   Collection Time: 02/04/20 12:44 AM   Specimen: Nasopharyngeal Swab  Result Value Ref Range Status   SARS Coronavirus 2 by RT PCR NEGATIVE NEGATIVE Final    Comment: (NOTE) SARS-CoV-2 target nucleic acids are NOT DETECTED. The SARS-CoV-2 RNA is generally detectable in upper respiratoy specimens during the acute phase of infection. The lowest concentration of SARS-CoV-2 viral copies this assay can detect is 131 copies/mL. A negative result does not preclude SARS-Cov-2 infection and should not be used as the sole basis for treatment or other patient management decisions. A negative result may occur with  improper specimen collection/handling, submission of specimen other than nasopharyngeal swab, presence of viral mutation(s) within the areas targeted by this assay, and inadequate number of viral copies (<131 copies/mL). A negative result must be combined with clinical observations, patient history, and epidemiological information. The expected result is Negative. Fact Sheet for Patients:  PinkCheek.be Fact Sheet for Healthcare Providers:  GravelBags.it This test is not yet ap proved or cleared by the  [place] FDA and  has been authorized for detection and/or diagnosis of SARS-CoV-2 by FDA under an Emergency Use Authorization (EUA). This EUA will remain  in effect (meaning this test can be used) for the duration of the COVID-19 declaration under Section 564(b)(1) of the Act, 21 U.S.C. section 360bbb-3(b)(1), unless the authorization is terminated or revoked sooner.    Influenza A by PCR NEGATIVE NEGATIVE Final   Influenza B by PCR NEGATIVE NEGATIVE Final    Comment: (NOTE) The Xpert Xpress SARS-CoV-2/FLU/RSV assay is intended as an aid in  the diagnosis of influenza from Nasopharyngeal swab specimens and  should not be used as a sole basis for treatment. Nasal washings and  aspirates are unacceptable for Xpert Xpress SARS-CoV-2/FLU/RSV  testing. Fact Sheet for Patients: PinkCheek.be Fact Sheet for Healthcare Providers: GravelBags.it This test is not yet approved or cleared by the [place] FDA and  has been authorized for detection and/or diagnosis of SARS-CoV-2 by  FDA under an Emergency Use Authorization (EUA). This EUA will remain  in effect (meaning this test can be used) for the duration of the  Covid-19 declaration under Section 564(b)(1) of the Act, 21  U.S.C. section 360bbb-3(b)(1), unless the authorization is  terminated or revoked. Performed at [facility], [place] [address] [place], [place] [address]   Surgical pcr screen     Status: Abnormal   Collection Time: 02/04/20 10:03 AM   Specimen: Nasal Mucosa; Nasal Swab  Result Value Ref Range Status   MRSA, PCR NEGATIVE NEGATIVE Final   Staphylococcus aureus POSITIVE (A) NEGATIVE Final    Comment: (NOTE) The Xpert SA Assay (FDA approved for NASAL specimens in patients 60 years of age and older), is one component of a comprehensive surveillance program. It is not intended to diagnose infection nor to guide or monitor treatment. Performed at [facility], [place] [address] [place], [place] [address]      Studies: No results found.  Scheduled Meds: . chlorhexidine  15 mL Mouth Rinse BID  . Chlorhexidine Gluconate Cloth  6 each Topical Daily  . docusate sodium  100 mg Oral BID  .  enoxaparin (LOVENOX) injection  40 mg Subcutaneous Q24H  . feeding supplement (ENSURE ENLIVE)  237 mL Oral QHS  . ferrous sulfate  325 mg Oral Q breakfast  . mouth rinse  15 mL Mouth Rinse q12n4p  . metoprolol tartrate  12.5 mg Oral BID  . mirabegron ER  50 mg Oral Daily  . multivitamin with minerals  1 tablet Oral Daily  . mupirocin ointment  1 application Nasal BID  . nutrition supplement (JUVEN)  1 packet Oral BID BM  . pantoprazole  40 mg Oral Daily  . rosuvastatin  40 mg Oral QPM  . senna-docusate  1 tablet Oral QHS  . sertraline  200 mg Oral Daily  . tamsulosin  0.4 mg Oral BID    Continuous Infusions: . methocarbamol (ROBAXIN) IV       Time spent: 56mns I have personally reviewed and interpreted on  02/07/2020 daily labs, tele strips, imagings as discussed above under date review session and assessment and plans.  I reviewed all nursing notes, pharmacy notes, consultant notes,  vitals, pertinent old records  I have discussed plan of care as described above with RN , patient and family on 02/07/2020   FFlorencia ReasonsMD, PhD, FACP  [facility]  Available via Epic secure chat 7am-7pm for nonurgent issues Please page for urgent issues, pager number available through [email or web address] .   02/07/2020, 9:39 AM  LOS: 3 days

## 2020-02-07 NOTE — Progress Notes (Signed)
Inpatient Rehabilitation Admissions Coordinator  Dr. Delice Lesch feels patient not medically ready to admit to CIR this weekend due to his Heart rate and BP issues. I have notified Dr. Erlinda Hong and contacted pt's wife by phone. I will follow up on Monday for possible admit pending his medical readiness to admit.  Danne Baxter, RN, MSN Rehab Admissions Coordinator [phone] 02/07/2020 9:35 AM

## 2020-02-07 NOTE — Progress Notes (Signed)
Orthopedic Tech Progress Note Patient Details:  [name]. 1941-04-19 [id number]  Ortho Devices Type of Ortho Device: Postop shoe/boot Ortho Device/Splint Location: right Ortho Device/Splint Interventions: Application   Post Interventions Patient Tolerated: Well Instructions Provided: Care of device   [place] Pink 02/07/2020, 9:03 AM

## 2020-02-07 NOTE — Plan of Care (Signed)
  Problem: Pain Managment: Goal: General experience of comfort will improve Outcome: Progressing   Problem: Safety: Goal: Ability to remain free from injury will improve Outcome: Progressing   Problem: Skin Integrity: Goal: Risk for impaired skin integrity will decrease Outcome: Progressing   

## 2020-02-07 NOTE — Progress Notes (Signed)
Patient ID: Jamie Singleton., male   DOB: 06-23-1941, 79 y.o.   MRN: [id number] Postoperative day 1 right second toe amputation at the MTP joint.  Patient has no complaints this morning.  Anticipate patient will go to rehab.  Ideally patient needs to be touchdown weightbearing on the right however if patient needs full weightbearing for compliance with rehab that should be okay.  I will follow-up in the office 1 week after discharge.  Change dressing as needed.  Dressing clean and dry at this time.

## 2020-02-08 ENCOUNTER — Inpatient Hospital Stay (HOSPITAL_COMMUNITY): Payer: Medicare Other

## 2020-02-08 ENCOUNTER — Inpatient Hospital Stay (HOSPITAL_COMMUNITY): Payer: Medicare Other | Admitting: Occupational Therapy

## 2020-02-08 ENCOUNTER — Inpatient Hospital Stay (HOSPITAL_COMMUNITY): Payer: Medicare Other | Admitting: Physical Therapy

## 2020-02-08 LAB — CBC
HCT: 28 % — ABNORMAL LOW (ref 39.0–52.0)
Hemoglobin: 9.2 g/dL — ABNORMAL LOW (ref 13.0–17.0)
MCH: 30.8 pg (ref 26.0–34.0)
MCHC: 32.9 g/dL (ref 30.0–36.0)
MCV: 93.6 fL (ref 80.0–100.0)
Platelets: 82 10*3/uL — ABNORMAL LOW (ref 150–400)
RBC: 2.99 MIL/uL — ABNORMAL LOW (ref 4.22–5.81)
RDW: 15.2 % (ref 11.5–15.5)
WBC: 4.4 10*3/uL (ref 4.0–10.5)
nRBC: 0 % (ref 0.0–0.2)

## 2020-02-08 LAB — BASIC METABOLIC PANEL
Anion gap: 8 (ref 5–15)
BUN: 27 mg/dL — ABNORMAL HIGH (ref 8–23)
CO2: 25 mmol/L (ref 22–32)
Calcium: 8.6 mg/dL — ABNORMAL LOW (ref 8.9–10.3)
Chloride: 104 mmol/L (ref 98–111)
Creatinine, Ser: 1.1 mg/dL (ref 0.61–1.24)
GFR calc Af Amer: >60 mL/min (ref >60)
GFR calc non Af Amer: >60 mL/min (ref >60)
Glucose, Bld: 102 mg/dL — ABNORMAL HIGH (ref 70–99)
Potassium: 3.9 mmol/L (ref 3.5–5.1)
Sodium: 137 mmol/L (ref 135–145)

## 2020-02-08 LAB — MAGNESIUM: Magnesium: 2.2 mg/dL (ref 1.7–2.4)

## 2020-02-08 MED ORDER — POTASSIUM CHLORIDE CRYS ER 20 MEQ PO TBCR
20.0000 meq | EXTENDED_RELEASE_TABLET | Freq: Every day | ORAL | Status: DC
  Administered 2020-02-09: 20 meq via ORAL
  Filled 2020-02-08: qty 1

## 2020-02-08 MED ORDER — POTASSIUM CHLORIDE CRYS ER 20 MEQ PO TBCR
20.0000 meq | EXTENDED_RELEASE_TABLET | Freq: Once | ORAL | Status: AC
  Administered 2020-02-08: 20 meq via ORAL
  Filled 2020-02-08: qty 1

## 2020-02-08 MED ORDER — SODIUM CHLORIDE 0.9 % IV BOLUS
500.0000 mL | Freq: Once | INTRAVENOUS | Status: AC
  Administered 2020-02-08: 500 mL via INTRAVENOUS

## 2020-02-08 MED ORDER — TAMSULOSIN HCL 0.4 MG PO CAPS
0.4000 mg | ORAL_CAPSULE | Freq: Every day | ORAL | Status: DC
  Administered 2020-02-08: 0.4 mg via ORAL
  Filled 2020-02-08: qty 1

## 2020-02-08 NOTE — Progress Notes (Signed)
Patient declined CPAP use at this time.

## 2020-02-08 NOTE — Progress Notes (Signed)
PROGRESS NOTE  [name]. [id number] DOB: 03/22/1941 DOA: 02/03/2020 PCP: [name], MD  Brief summary:  [name]. is a 79 y.o. male with medical history significant for chronic diastolic and right-sided heart failure, atrial fibrillation status post Maze procedure no longer anticoagulated after spontaneous SDH, CAD status post CABG, symptomatic bradycardia now with pacer, chronic kidney disease stage IIIa, OSA on CPAP, chronic anemia and thrombocytopenia, pulmonary fibrosis, and frequent falls, now presenting to emergency department for evaluation of right hip pain after fall  HPI/Recap of past 24 hours:  No acute event overnight, he is very pleasant, denies acute complaints   CIR placement on hold due to low normal bp and intermittent tachycardia, bp and heart rate has improved over the weekend, he denies chest pain, no sob, no dizziness, no edema   Assessment/Plan: Principal Problem:   Closed right hip fracture, initial encounter [facility]) Active Problems:   Atrial fibrillation (HCC)   Postinflammatory pulmonary fibrosis (HCC)   Thrombocytopenia (HCC)   Anemia, chronic disease   Chronic right-sided CHF (congestive heart failure) (HCC)   Chronic diastolic heart failure (HCC)   Coronary artery disease involving native heart without angina pectoris   Closed intertrochanteric fracture of right hip, initial encounter ([place])   Osteomyelitis of second toe of right foot ([place])  Acute right intertrochanteric hip fracture with h/o frequent falls -patient lost balance and fell into wall.  Patient landed on right hip -s/p Closed intramedullary nailing of right intertrochanteric femur fracture using [employer] short trochanter entry nail.by Dr [name] on 2/17 -post op DVT prophylaxis per ortho - ortho input appreciated  Ulceration right foot second toe PIP joint Gotten worse for the last week with drainage per wife S/p right second toe amputation on 2/19 by Dr [name],"  Touchdown weightbearing on the right"  H/o c spine injury from falls, has been wearing neck collar since 10/2019 per wife  PAF with sick sinus dysfunction s/p pacemaker  h/o TIA/CVA with chronic memory deficit  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 Saintclair Halsted.  bp low normal,  betablocker dose decreased    chronic right sided CHF Per his cardiologist Dr [name] "His right-sided congestive heart failure is felt secondary to a combination of pulmonary venous hypertension, pulmonary fibrosis and sleep apnea.  bp low normal, continue hold diuretic, 500cc fluids bolusx1 on 2/18,  x2 on 2/19, x1 on 2/21 Continue hold Home meds demadex , plan to resume diuretics in 1-2 days.  Close monitor volume status, Currently euvolemic to dry , no edema, cxr no acute findings ,  severe tricuspid regurgitation- Per cardiology "likely secondary to right ventricular enlargement related to pulmonary hypertension versus trauma from pacemaker.  Patient is not a surgical candidate given multiple comorbidities."  H/o CAD s/p CABG with modified Cox Maze IV procedure in 12/2007. Denies chest pain Continue home meds statin  H/o mild to moderate pulmonary fibrosis on CT chest in 07/2019. Pulmonary function test November 2020 normal.   chronic Anemia, thrombocytopenia  - he is followed  By Dr [name] - Type and screen was performed in ED -monitor CBC  CKD stage IIIa  - SCr is 1.37 in ED, similar to priors  - Renally-dose medications, monitor   BPH:  continue flomax at home does , bladder scan 35cc, good urine output  OSA on nightly cpap chronically  DVT Prophylaxis: scd's, lovenox ( he declined lovenox)  Code Status: full  Family Communication: wife at bedside on 2/20  Disposition Plan:    Patient came from:        home                                                                                                    Anticipated d/c place:  CIR   Barriers to d/c OR conditions which need to be met to effect a safe d/c: needs toe  Pending bp and heart rate improvement, CIR reeval on monday   Consultants:  Dr [name] ( right hip)  Dr [name] (right toe)  Procedures: INTRAMEDULLARY (IM) NAIL INTERTROCHANTRIC on 2/17 by Dr [name] Right toe amputation  on 2/19 by Dr [name]  Antibiotics:  Perioperative per ortho   Objective: BP (!) 92/54 (BP Location: Left Arm)   Pulse 60   Temp 98 F (36.7 C) (Oral)   Resp 16   Ht '5\' 7"'  (1.702 m)   Wt 81.1 kg   SpO2 98%   BMI 28.00 kg/m   Intake/Output Summary (Last 24 hours) at 02/08/2020 0370 Last data filed at 02/08/2020 0300 Gross per 24 hour  Intake 960 ml  Output 1550 ml  Net -590 ml   Filed Weights   02/04/20 0950 02/06/20 1016  Weight: 81.1 kg 81.1 kg    Exam: Patient is examined daily including today on 02/08/2020, exams remain the same as of yesterday except that has changed    General:  Very pleasant, NAD, wearing a neck collar  Cardiovascular: IRRR  Respiratory: CTABL  Abdomen: Soft/ND/NT, positive BS  Musculoskeletal: No Edema, skin tear to right elvow,  right hip post op changes, s/p  right second  toe amputation, dressing in place  Neuro: very pleasant, alert, oriented to place, person and situation, slightly confused about the year, able to redirected, (per wife, this is baseline )      Data Reviewed: Basic Metabolic Panel: Recent Labs  Lab 02/04/20 0418 02/05/20 0203 02/06/20 0509 02/07/20 0345 02/08/20 0649  NA 141 140 138 136 137  K 3.9 4.3 3.9 4.4 3.9  CL 101 103 102 102 104  CO2 '27 26 28 26 25  ' GLUCOSE 104* 126* 99 97 102*  BUN 24* 23 37* 33* 27*  CREATININE 1.30* 1.24 1.41* 1.22 1.10  CALCIUM 9.4 8.9 8.4* 8.2* 8.6*  MG  --   --  2.0  --  2.2   Liver Function Tests: No results for input(s): AST, ALT, ALKPHOS, BILITOT, PROT, ALBUMIN in the last 168 hours. No results for input(s): LIPASE, AMYLASE in the  last 168 hours. No results for input(s): AMMONIA in the last 168 hours. CBC: Recent Labs  Lab 02/04/20 0418 02/05/20 0203 02/06/20 0509 02/07/20 0345 02/08/20 0649  WBC 6.1 6.2 5.4 4.2 4.4  HGB 11.2* 11.2* 10.2* 8.9* 9.2*  HCT 34.0* 34.1* 31.4* 26.9* 28.0*  MCV 93.4 94.5 94.6 94.4 93.6  PLT 111* 97* 87* 68* 82*   Cardiac Enzymes:   No results for input(s): CKTOTAL, CKMB, CKMBINDEX, TROPONINI in the last 168 hours. BNP (last 3  results) Recent Labs    05/16/19 0609  BNP 289.1*    ProBNP (last 3 results) No results for input(s): PROBNP in the last 8760 hours.  CBG: Recent Labs  Lab 02/04/20 1751 02/06/20 0952 02/06/20 1301  GLUCAP 98 85 93    Recent Results (from the past 240 hour(s))  Respiratory Panel by RT PCR (Flu A&B, Covid) - Nasopharyngeal Swab     Status: None   Collection Time: 02/04/20 12:44 AM   Specimen: Nasopharyngeal Swab  Result Value Ref Range Status   SARS Coronavirus 2 by RT PCR NEGATIVE NEGATIVE Final    Comment: (NOTE) SARS-CoV-2 target nucleic acids are NOT DETECTED. The SARS-CoV-2 RNA is generally detectable in upper respiratoy specimens during the acute phase of infection. The lowest concentration of SARS-CoV-2 viral copies this assay can detect is 131 copies/mL. A negative result does not preclude SARS-Cov-2 infection and should not be used as the sole basis for treatment or other patient management decisions. A negative result may occur with  improper specimen collection/handling, submission of specimen other than nasopharyngeal swab, presence of viral mutation(s) within the areas targeted by this assay, and inadequate number of viral copies (<131 copies/mL). A negative result must be combined with clinical observations, patient history, and epidemiological information. The expected result is Negative. Fact Sheet for Patients:  PinkCheek.be Fact Sheet for Healthcare Providers:    GravelBags.it This test is not yet ap proved or cleared by the [place] FDA and  has been authorized for detection and/or diagnosis of SARS-CoV-2 by FDA under an Emergency Use Authorization (EUA). This EUA will remain  in effect (meaning this test can be used) for the duration of the COVID-19 declaration under Section 564(b)(1) of the Act, 21 U.S.C. section 360bbb-3(b)(1), unless the authorization is terminated or revoked sooner.    Influenza A by PCR NEGATIVE NEGATIVE Final   Influenza B by PCR NEGATIVE NEGATIVE Final    Comment: (NOTE) The Xpert Xpress SARS-CoV-2/FLU/RSV assay is intended as an aid in  the diagnosis of influenza from Nasopharyngeal swab specimens and  should not be used as a sole basis for treatment. Nasal washings and  aspirates are unacceptable for Xpert Xpress SARS-CoV-2/FLU/RSV  testing. Fact Sheet for Patients: PinkCheek.be Fact Sheet for Healthcare Providers: GravelBags.it This test is not yet approved or cleared by the [place] FDA and  has been authorized for detection and/or diagnosis of SARS-CoV-2 by  FDA under an Emergency Use Authorization (EUA). This EUA will remain  in effect (meaning this test can be used) for the duration of the  Covid-19 declaration under Section 564(b)(1) of the Act, 21  U.S.C. section 360bbb-3(b)(1), unless the authorization is  terminated or revoked. Performed at [facility], [place] [address], [place], [place] [address]   Surgical pcr screen     Status: Abnormal   Collection Time: 02/04/20 10:03 AM   Specimen: Nasal Mucosa; Nasal Swab  Result Value Ref Range Status   MRSA, PCR NEGATIVE NEGATIVE Final   Staphylococcus aureus POSITIVE (A) NEGATIVE Final    Comment: (NOTE) The Xpert SA Assay (FDA approved for NASAL specimens in patients 43 years of age and older), is one component of a comprehensive surveillance program. It  is not intended to diagnose infection nor to guide or monitor treatment. Performed at [facility], [place] [address] [place], [place] [address]      Studies: No results found.  Scheduled Meds: . chlorhexidine  15 mL Mouth Rinse BID  .  Chlorhexidine Gluconate Cloth  6 each Topical Daily  . docusate sodium  100 mg Oral BID  . enoxaparin (LOVENOX) injection  40 mg Subcutaneous Q24H  . feeding supplement (ENSURE ENLIVE)  237 mL Oral QHS  . ferrous sulfate  325 mg Oral Q breakfast  . mouth rinse  15 mL Mouth Rinse q12n4p  . metoprolol tartrate  12.5 mg Oral BID  . mirabegron ER  50 mg Oral Daily  . multivitamin with minerals  1 tablet Oral Daily  . mupirocin ointment  1 application Nasal BID  . nutrition supplement (JUVEN)  1 packet Oral BID BM  . pantoprazole  40 mg Oral Daily  . rosuvastatin  40 mg Oral QPM  . senna-docusate  1 tablet Oral QHS  . sertraline  200 mg Oral Daily  . tamsulosin  0.4 mg Oral BID    Continuous Infusions: . methocarbamol (ROBAXIN) IV       Time spent: 22mns I have personally reviewed and interpreted on  02/08/2020 daily labs, tele strips, imagings as discussed above under date review session and assessment and plans.  I reviewed all nursing notes, pharmacy notes, consultant notes,  vitals, pertinent old records  I have discussed plan of care as described above with RN , patient on 02/08/2020   FFlorencia ReasonsMD, PhD, FACP  [facility]  Available via Epic secure chat 7am-7pm for nonurgent issues Please page for urgent issues, pager number available through [email or web address] .   02/08/2020, 8:06 AM  LOS: 4 days

## 2020-02-09 ENCOUNTER — Encounter (HOSPITAL_COMMUNITY): Payer: Self-pay | Admitting: Physical Medicine & Rehabilitation

## 2020-02-09 ENCOUNTER — Inpatient Hospital Stay (HOSPITAL_COMMUNITY)
Admission: RE | Admit: 2020-02-09 | Discharge: 2020-02-25 | DRG: 560 | Disposition: A | Discharge status: Home Health | Payer: Medicare Other | Source: Intra-hospital | Attending: Physical Medicine & Rehabilitation | Admitting: Physical Medicine & Rehabilitation

## 2020-02-09 ENCOUNTER — Inpatient Hospital Stay (HOSPITAL_COMMUNITY): Payer: Medicare Other | Admitting: Occupational Therapy

## 2020-02-09 ENCOUNTER — Inpatient Hospital Stay (HOSPITAL_COMMUNITY): Payer: Medicare Other

## 2020-02-09 ENCOUNTER — Other Ambulatory Visit: Payer: Self-pay

## 2020-02-09 ENCOUNTER — Inpatient Hospital Stay (HOSPITAL_COMMUNITY): Payer: Medicare Other | Admitting: Physical Therapy

## 2020-02-09 DIAGNOSIS — I2510 Atherosclerotic heart disease of native coronary artery without angina pectoris: Secondary | ICD-10-CM | POA: Diagnosis present

## 2020-02-09 DIAGNOSIS — S72141A Displaced intertrochanteric fracture of right femur, initial encounter for closed fracture: Secondary | ICD-10-CM | POA: Diagnosis not present

## 2020-02-09 DIAGNOSIS — E1122 Type 2 diabetes mellitus with diabetic chronic kidney disease: Secondary | ICD-10-CM | POA: Diagnosis present

## 2020-02-09 DIAGNOSIS — G4733 Obstructive sleep apnea (adult) (pediatric): Secondary | ICD-10-CM | POA: Diagnosis not present

## 2020-02-09 DIAGNOSIS — K5900 Constipation, unspecified: Secondary | ICD-10-CM | POA: Diagnosis not present

## 2020-02-09 DIAGNOSIS — M7989 Other specified soft tissue disorders: Secondary | ICD-10-CM | POA: Diagnosis not present

## 2020-02-09 DIAGNOSIS — I50812 Chronic right heart failure: Secondary | ICD-10-CM

## 2020-02-09 DIAGNOSIS — J8410 Pulmonary fibrosis, unspecified: Secondary | ICD-10-CM | POA: Diagnosis present

## 2020-02-09 DIAGNOSIS — S72141D Displaced intertrochanteric fracture of right femur, subsequent encounter for closed fracture with routine healing: Principal | ICD-10-CM

## 2020-02-09 DIAGNOSIS — I130 Hypertensive heart and chronic kidney disease with heart failure and stage 1 through stage 4 chronic kidney disease, or unspecified chronic kidney disease: Secondary | ICD-10-CM | POA: Diagnosis not present

## 2020-02-09 DIAGNOSIS — Z951 Presence of aortocoronary bypass graft: Secondary | ICD-10-CM | POA: Diagnosis not present

## 2020-02-09 DIAGNOSIS — S72009A Fracture of unspecified part of neck of unspecified femur, initial encounter for closed fracture: Secondary | ICD-10-CM | POA: Diagnosis present

## 2020-02-09 DIAGNOSIS — E1142 Type 2 diabetes mellitus with diabetic polyneuropathy: Secondary | ICD-10-CM | POA: Diagnosis present

## 2020-02-09 DIAGNOSIS — D472 Monoclonal gammopathy: Secondary | ICD-10-CM | POA: Diagnosis present

## 2020-02-09 DIAGNOSIS — Z89421 Acquired absence of other right toe(s): Secondary | ICD-10-CM | POA: Diagnosis not present

## 2020-02-09 DIAGNOSIS — I482 Chronic atrial fibrillation, unspecified: Secondary | ICD-10-CM | POA: Diagnosis not present

## 2020-02-09 DIAGNOSIS — W1830XD Fall on same level, unspecified, subsequent encounter: Secondary | ICD-10-CM | POA: Diagnosis not present

## 2020-02-09 DIAGNOSIS — N400 Enlarged prostate without lower urinary tract symptoms: Secondary | ICD-10-CM | POA: Diagnosis not present

## 2020-02-09 DIAGNOSIS — I4891 Unspecified atrial fibrillation: Secondary | ICD-10-CM | POA: Diagnosis not present

## 2020-02-09 DIAGNOSIS — Z87891 Personal history of nicotine dependence: Secondary | ICD-10-CM | POA: Diagnosis not present

## 2020-02-09 DIAGNOSIS — N182 Chronic kidney disease, stage 2 (mild): Secondary | ICD-10-CM | POA: Diagnosis not present

## 2020-02-09 DIAGNOSIS — I5032 Chronic diastolic (congestive) heart failure: Secondary | ICD-10-CM | POA: Diagnosis present

## 2020-02-09 DIAGNOSIS — E785 Hyperlipidemia, unspecified: Secondary | ICD-10-CM | POA: Diagnosis not present

## 2020-02-09 DIAGNOSIS — S72141S Displaced intertrochanteric fracture of right femur, sequela: Secondary | ICD-10-CM | POA: Diagnosis not present

## 2020-02-09 LAB — BASIC METABOLIC PANEL
Anion gap: 8 (ref 5–15)
BUN: 31 mg/dL — ABNORMAL HIGH (ref 8–23)
CO2: 26 mmol/L (ref 22–32)
Calcium: 8.9 mg/dL (ref 8.9–10.3)
Chloride: 103 mmol/L (ref 98–111)
Creatinine, Ser: 1.18 mg/dL (ref 0.61–1.24)
GFR calc Af Amer: >60 mL/min (ref >60)
GFR calc non Af Amer: 59 mL/min — ABNORMAL LOW (ref >60)
Glucose, Bld: 107 mg/dL — ABNORMAL HIGH (ref 70–99)
Potassium: 5 mmol/L (ref 3.5–5.1)
Sodium: 137 mmol/L (ref 135–145)

## 2020-02-09 LAB — CBC
HCT: 29 % — ABNORMAL LOW (ref 39.0–52.0)
Hemoglobin: 9.4 g/dL — ABNORMAL LOW (ref 13.0–17.0)
MCH: 31.2 pg (ref 26.0–34.0)
MCHC: 32.4 g/dL (ref 30.0–36.0)
MCV: 96.3 fL (ref 80.0–100.0)
Platelets: 70 10*3/uL — ABNORMAL LOW (ref 150–400)
RBC: 3.01 MIL/uL — ABNORMAL LOW (ref 4.22–5.81)
RDW: 15.6 % — ABNORMAL HIGH (ref 11.5–15.5)
WBC: 3.3 10*3/uL — ABNORMAL LOW (ref 4.0–10.5)
nRBC: 0 % (ref 0.0–0.2)

## 2020-02-09 MED ORDER — METOPROLOL SUCCINATE ER 25 MG PO TB24: 12.5000 mg | ORAL_TABLET | Freq: Every day | ORAL | Status: DC

## 2020-02-09 MED ORDER — FERROUS SULFATE 325 (65 FE) MG PO TABS
325.0000 mg | ORAL_TABLET | Freq: Every day | ORAL | Status: DC
  Administered 2020-02-10 – 2020-02-25 (×16): 325 mg via ORAL
  Filled 2020-02-09 (×16): qty 1

## 2020-02-09 MED ORDER — METHOCARBAMOL 1000 MG/10ML IJ SOLN
500.0000 mg | Freq: Four times a day (QID) | INTRAVENOUS | Status: DC | PRN
  Filled 2020-02-09: qty 5

## 2020-02-09 MED ORDER — TAMSULOSIN HCL 0.4 MG PO CAPS
0.4000 mg | ORAL_CAPSULE | Freq: Every day | ORAL | Status: DC
  Administered 2020-02-09 – 2020-02-24 (×16): 0.4 mg via ORAL
  Filled 2020-02-09 (×16): qty 1

## 2020-02-09 MED ORDER — ADULT MULTIVITAMIN W/MINERALS CH: 1.0000 | ORAL_TABLET | Freq: Every day | ORAL | Status: AC

## 2020-02-09 MED ORDER — ENOXAPARIN SODIUM 40 MG/0.4ML ~~LOC~~ SOLN: 40.0000 mg | SUBCUTANEOUS | Status: DC

## 2020-02-09 MED ORDER — ENSURE ENLIVE PO LIQD: 237.0000 mL | Freq: Every day | ORAL | 12 refills | Status: DC

## 2020-02-09 MED ORDER — DOCUSATE SODIUM 100 MG PO CAPS: 100.0000 mg | ORAL_CAPSULE | Freq: Two times a day (BID) | ORAL | 0 refills | Status: DC

## 2020-02-09 MED ORDER — DOCUSATE SODIUM 100 MG PO CAPS
100.0000 mg | ORAL_CAPSULE | Freq: Two times a day (BID) | ORAL | Status: DC
  Administered 2020-02-09 – 2020-02-16 (×14): 100 mg via ORAL
  Filled 2020-02-09 (×14): qty 1

## 2020-02-09 MED ORDER — HYDROCODONE-ACETAMINOPHEN 5-325 MG PO TABS
1.0000 | ORAL_TABLET | ORAL | Status: DC | PRN
  Administered 2020-02-09 – 2020-02-13 (×8): 2 via ORAL
  Administered 2020-02-14 – 2020-02-24 (×6): 1 via ORAL
  Filled 2020-02-09 (×2): qty 1
  Filled 2020-02-09: qty 2
  Filled 2020-02-09 (×2): qty 1
  Filled 2020-02-09: qty 2
  Filled 2020-02-09: qty 1
  Filled 2020-02-09 (×7): qty 2

## 2020-02-09 MED ORDER — JUVEN PO PACK: 1.0000 | PACK | Freq: Two times a day (BID) | ORAL | 0 refills | Status: DC

## 2020-02-09 MED ORDER — ADULT MULTIVITAMIN W/MINERALS CH
1.0000 | ORAL_TABLET | Freq: Every day | ORAL | Status: DC
  Administered 2020-02-10 – 2020-02-25 (×16): 1 via ORAL
  Filled 2020-02-09 (×16): qty 1

## 2020-02-09 MED ORDER — POLYETHYLENE GLYCOL 3350 17 G PO PACK: 17.0000 g | PACK | Freq: Every day | ORAL | 0 refills | Status: DC | PRN

## 2020-02-09 MED ORDER — POTASSIUM CHLORIDE CRYS ER 20 MEQ PO TBCR
20.0000 meq | EXTENDED_RELEASE_TABLET | Freq: Every day | ORAL | Status: DC
  Administered 2020-02-10 – 2020-02-25 (×16): 20 meq via ORAL
  Filled 2020-02-09 (×16): qty 1

## 2020-02-09 MED ORDER — PHENOL 1.4 % MT LIQD: 1.0000 | OROMUCOSAL | 0 refills | Status: DC | PRN

## 2020-02-09 MED ORDER — FERROUS SULFATE 325 (65 FE) MG PO TABS: 325.0000 mg | ORAL_TABLET | Freq: Every day | ORAL | 3 refills | Status: DC

## 2020-02-09 MED ORDER — ACETAMINOPHEN 325 MG PO TABS: 325.0000 mg | ORAL_TABLET | Freq: Four times a day (QID) | ORAL | Status: DC | PRN

## 2020-02-09 MED ORDER — METHOCARBAMOL 500 MG PO TABS
500.0000 mg | ORAL_TABLET | Freq: Four times a day (QID) | ORAL | Status: DC | PRN
  Administered 2020-02-15 – 2020-02-18 (×3): 500 mg via ORAL
  Filled 2020-02-09 (×3): qty 1

## 2020-02-09 MED ORDER — SERTRALINE HCL 100 MG PO TABS
200.0000 mg | ORAL_TABLET | Freq: Every day | ORAL | Status: DC
  Administered 2020-02-10 – 2020-02-25 (×16): 200 mg via ORAL
  Filled 2020-02-09 (×16): qty 2

## 2020-02-09 MED ORDER — TORSEMIDE 10 MG PO TABS: 10.0000 mg | ORAL_TABLET | Freq: Every day | ORAL | Status: DC

## 2020-02-09 MED ORDER — ROSUVASTATIN CALCIUM 20 MG PO TABS
40.0000 mg | ORAL_TABLET | Freq: Every evening | ORAL | Status: DC
  Administered 2020-02-09 – 2020-02-24 (×16): 40 mg via ORAL
  Filled 2020-02-09 (×16): qty 2

## 2020-02-09 MED ORDER — CHLORHEXIDINE GLUCONATE 0.12 % MT SOLN: 15.0000 mL | Freq: Two times a day (BID) | OROMUCOSAL | 0 refills | Status: DC

## 2020-02-09 MED ORDER — MIRABEGRON ER 50 MG PO TB24
50.0000 mg | ORAL_TABLET | Freq: Every day | ORAL | Status: DC
  Administered 2020-02-10 – 2020-02-25 (×16): 50 mg via ORAL
  Filled 2020-02-09 (×17): qty 1

## 2020-02-09 MED ORDER — ONDANSETRON HCL 4 MG PO TABS: 4.0000 mg | ORAL_TABLET | Freq: Four times a day (QID) | ORAL | 0 refills | Status: DC | PRN

## 2020-02-09 MED ORDER — ENOXAPARIN SODIUM 40 MG/0.4ML ~~LOC~~ SOLN
40.0000 mg | SUBCUTANEOUS | Status: DC
  Administered 2020-02-10 – 2020-02-25 (×16): 40 mg via SUBCUTANEOUS
  Filled 2020-02-09 (×16): qty 0.4

## 2020-02-09 MED ORDER — METOPROLOL SUCCINATE ER 25 MG PO TB24
12.5000 mg | ORAL_TABLET | Freq: Every day | ORAL | Status: DC
  Administered 2020-02-12 – 2020-02-25 (×14): 12.5 mg via ORAL
  Filled 2020-02-09 (×16): qty 1

## 2020-02-09 MED ORDER — TORSEMIDE 20 MG PO TABS
10.0000 mg | ORAL_TABLET | Freq: Every day | ORAL | Status: DC
  Administered 2020-02-09 – 2020-02-25 (×17): 10 mg via ORAL
  Filled 2020-02-09 (×17): qty 1

## 2020-02-09 NOTE — Plan of Care (Signed)
  Problem: Pain Managment: Goal: General experience of comfort will improve Outcome: Progressing   Problem: Safety: Goal: Ability to remain free from injury will improve Outcome: Progressing   Problem: Skin Integrity: Goal: Risk for impaired skin integrity will decrease Outcome: Progressing   

## 2020-02-09 NOTE — Progress Notes (Signed)
Patient refused CPAP for tonight. RT instructed patient to have RT called if he changes his mind. RT will monitor as needed. 

## 2020-02-09 NOTE — Progress Notes (Signed)
Inpatient Rehabilitation Admissions Coordinator  I met with patient at bedside for assessment. I can admit patient to inpt rehab/CIR today. I have contacted Dr. Earnest Conroy for clearance to discharge him to CIR today. Michiel Cowboy , SW made aware. Patient is in agreement.  Danne Baxter, RN, MSN Rehab Admissions Coordinator [phone] 02/09/2020 10:44 AM

## 2020-02-09 NOTE — Progress Notes (Signed)
POD 3 Right 2nd toe amputation. Doing well .   Dressing dry drainage intact. Swelling controlled. Dressing change today and then as needed

## 2020-02-09 NOTE — Progress Notes (Signed)
Orthopedic Tech Progress Note Patient Details:  [name]. 10-Jun-1941 [id number]  Ortho Devices Type of Ortho Device: Postop shoe/boot Ortho Device/Splint Location: RLE Ortho Device/Splint Interventions: Ordered, Application   Post Interventions Patient Tolerated: Well Instructions Provided: Care of device, Adjustment of device   Janit Pagan 02/09/2020, 5:34 PM

## 2020-02-09 NOTE — Discharge Summary (Signed)
Physician Discharge Summary  [name]. 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)   Chronic diastolic heart failure (HCC)   Coronary artery disease involving native heart without angina pectoris   Closed intertrochanteric fracture of right hip, initial encounter ([place])   Osteomyelitis of second toe of right foot [facility])   Hospital Course Summary: 79 y.o.malewith medical history significant forchronic diastolic and right-sided heart failure, atrial fibrillation status post Maze procedure- no longer anticoagulated after spontaneous SDH, CAD status post CABG, symptomatic bradycardia now with pacer, chronic kidney disease stage IIIa, OSA on CPAP, chronic anemia and thrombocytopenia, pulmonary fibrosis, and frequent falls, now presenting to emergency department for evaluation of right hip pain after fall-patient apparently lost balance and fell into wall. Patient landed on right elbow/ hip.  ED Course: Afebrile, saturating low 90s on room air, heart rate in the low 123XX123, and systolic blood pressure in the low 100s. EKG- atrial fibrillation with rate 103, RBBB, and borderline ST depression laterally. Radiographs of the right hip revealed right intertrochanteric fracture,  right elbow radiographs unremarkable. CT head negative and no acute fracture or subluxation is noted on cervical spine CT. BMP showed stable creatinine at 1.37, CBC notable for mild normocytic anemia and platelets 125,000. Patient was treated with fentanyl and Zofran in the emergency department, orthopedic  surgery was consulted. Hospital course: Patient admitted to [facility] for further evaluation and orthopedics followed along. Patient underwent closed intramedullary nailing of right intertrochanteric femur fracture using Biomet short trochanter entry nail by Dr [name] on 2/17. Post operatively patient was planned for CIR transfer. HC complicated by low normal BP requiring reduction in B-blocker dosing while watching HR.   Assessment/Plan: Acute right intertrochanteric hip fracture- due to a mechanical fall. s/p Closed intramedullary nailing of right intertrochanteric femur fracture using Biomet short trochanter entry nail.by Dr [name] on 2/17. Patient refused Lovenox for post op DVT prophylaxis. CIR evaluated patient and he has a bed today.  Ulceration right foot second toe PIP joint-Gotten worse over a week prior to admission with drainage per wife. Patient underwent right second toe amputation on 2/19 by Dr [name].  Touchdown weightbearing on the right allowed per Orthopedics.  Recurrent falls: H/o c spine injury from falls, has been wearing neck collar since 10/2019 per wife. Also has h/o SDH in 2013 and not on anticoagulation.He underwent craniotomy and evacuation by Dr. [name].Checked orthostatic BPs today which are unremarkable.   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 B-blocker dosage adjusted over the weekend in concern for low normal BP.  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 BP noted to stable around systolic 77XX123 and HR in 0000000 over the weekend. This morning systolic in high 0000000. Changed metoprolol 12.5 mg bid  to Toprol XL 12.5mg   with holding parameters. No tachy episodes , not worried about bradycardia as patient has pacemaker.   Chronic right sided CHF:  felt secondary to a combination of pulmonary venous hypertension, pulmonary fibrosis and sleep apnea. Diuretics (Demedex, Lasix) held in concern for low BP during this hospital  course. He also  recieved 500cc fluids bolusx1 on 2/18,x2 on 2/19 and  x1 on 2/21.Currently has trace LE edema now, cxr no acute findings .Resume diuretics at lower dose (Torsemide 10mg ) and additional dosing for as needed use based on fluid status/BP tolerance. He states he was previously on Lasix 60 mg which was changed to Torsemide by his primary cardiologist, Dr [name], about a month back. Advise f/u cardiology in 7-10 days for further titration  Severe tricuspid regurgitation-Per cardiology-likely secondary to right ventricular enlargement related to pulmonary hypertension versus trauma from pacemaker. Patient is not a surgical candidate [name] multiple comorbidities  h/o TIA/CVA with chronic memory deficit : Not on anticoagulation in concern for SDH. Not on ASA per home med list (does have thrombocytopenia h/o)  H/o CAD s/p CABG with modified Cox Maze IV procedure in 12/2007.Continue home meds statin. Not on asa (presumed secondary to thrombocytopenia/SDH) but patient will need to f/u with cardiology regarding risks and benefits [name] extensive atherosclerotic disease and far out from SDH episode. .  Pulmonary fibrosis: He has H/o mild to moderate pulmonary fibrosis on CT chest in 07/2019. Pulmonary function test November 2020 normal.  Chronic Anemia, thrombocytopenia-he is followed By Dr [name].Type and screen was performed in ED-serial CBC show stable Hgb around 9 to 10 and platelet count 70 to 90 (baseline appears to be 80-90)  CKD stage IIIa-SCr is 1.37 in ED, similar to priors-Renally-dose medications, monitoron low dose diuretics with periodic labs  [id number] at home dose, bladder scan 35cc, good urine output  Gout: resume Allopurinol  OSA on nightly cpap chronically, resume    Discharge Exam:  Vitals:   02/09/20 0914 02/09/20 0918  BP: 109/65 (!) 98/52  Pulse: (!) 59 67  Resp:    Temp:    SpO2:     Vitals:   02/09/20 0754 02/09/20  0911 02/09/20 0914 02/09/20 0918  BP: (!) 109/59 (!) 97/59 109/65 (!) 98/52  Pulse: 60 60 (!) 59 67  Resp: 18     Temp: 98 F (36.7 C)     TempSrc: Oral     SpO2: 97%     Weight:      Height:        General: Pt is alert, awake, not in acute distress, sitting in chair comfortably Cardiovascular: RRR, S1/S2 +, no rubs, no gallops. Trace LE edema/venous stasis  Respiratory: CTA bilaterally, no wheezing, no rhonchi Abdominal: Soft, NT, ND, bowel sounds + Extremities: dressing with ACE wrap along RLE  Discharge Condition:Stable CODE STATUS: Full code Diet recommendation: Heart healthy Recommendations for Outpatient Follow-up:  1. Follow up with PCP: 1 week upon discharge from CIR 2. Follow up with consultants: Dr [name] in 7 to 10 days 3. Please obtain follow up labs including: BMP in 5days    Discharge Instructions:  Discharge Instructions    ([place]) Call MD:  Anytime you have any of the following symptoms: 1) 3 pound weight gain in 24 hours or 5 pounds in 1 week 2) shortness of breath, with or without a dry hacking cough 3) swelling in the hands, feet or stomach 4) if you have to sleep on extra pillows at night in order to breathe.   Complete by: As directed    Call MD for:   Complete by: As directed    Dyspnea/leg swellings/dizziness   Call MD for:  persistant dizziness or light-headedness   Complete by: As directed    Call MD for:  redness, tenderness, or signs of infection (pain, swelling,  redness, odor or green/yellow discharge around incision site)   Complete by: As directed    Call MD for:  severe uncontrolled pain   Complete by: As directed    Call MD for:  temperature >100.4   Complete by: As directed    Diet - low sodium heart healthy   Complete by: As directed    Discharge instructions   Complete by: As directed    Weight bearing restriction as advised by Orthopedics   Increase activity slowly   Complete by: As directed      Allergies as  of 02/09/2020      Reactions   Lipitor [atorvastatin] Other (See Comments)   Stiff joints   Nsaids Other (See Comments)   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   allopurinol 300 MG tablet Commonly known as: ZYLOPRIM Take 300 mg by mouth daily.   chlorhexidine 0.12 % solution Commonly known as: PERIDEX 15 mLs by Mouth Rinse route 2 (two) times daily.   docusate sodium 100 MG capsule Commonly known as: COLACE Take 1 capsule (100 mg total) by mouth 2 (two) times daily.   feeding supplement (ENSURE ENLIVE) Liqd Take 237 mLs by mouth at bedtime.   nutrition supplement (JUVEN) Pack Take 1 packet by mouth 2 (two) times daily between meals.   ferrous sulfate 325 (65 FE) MG tablet Take 1 tablet (325 mg total) by mouth daily with breakfast. Start taking on: February 10, 2020   Flomax 0.4 MG Caps capsule Generic drug: tamsulosin Take 0.4 mg by mouth at bedtime.   HYDROcodone-acetaminophen 5-325 MG tablet Commonly known as: Norco Take 1 tablet by mouth every 6 (six) hours as needed for moderate pain or severe pain.   methocarbamol 500 MG tablet Commonly known as: Robaxin Take 1 tablet (500 mg total) by mouth every 8 (eight) hours as needed.   metoprolol succinate 25 MG 24 hr tablet Commonly known as: TOPROL-XL Take 0.5 tablets (12.5 mg total) by mouth daily. Start taking on: February 10, 2020   mirabegron ER 50 MG Tb24 tablet Commonly known as: Myrbetriq Take 1 tablet (50 mg total)  by mouth daily.   multivitamin with minerals Tabs tablet Take 1 tablet by mouth daily. Start taking on: February 10, 2020   ondansetron 4 MG tablet Commonly known as: ZOFRAN Take 1 tablet (4 mg total) by mouth every 6 (six) hours as needed for nausea.   pantoprazole 40 MG tablet Commonly known as: PROTONIX Take 40 mg by mouth daily.   phenol 1.4 % Liqd Commonly known as: CHLORASEPTIC Use as directed 1 spray in the mouth or throat as needed for throat irritation / pain.   polyethylene glycol 17 g packet Commonly known as: MIRALAX / GLYCOLAX Take 17 g by mouth daily as needed for mild constipation.   rosuvastatin 40 MG tablet Commonly known as: CRESTOR Take 1 tablet (40 mg total) by mouth daily. What changed: when to  take this   sertraline 100 MG tablet Commonly known as: ZOLOFT Take 200 mg by mouth daily.   torsemide 10 MG tablet Commonly known as: DEMADEX Take 1 tablet (10 mg total) by mouth daily. Take additional dose as needed for leg swellings/shortness of breath /weight gain What changed:   medication strength  how much to take  when to take this  additional instructions      Follow-up Information    Netta Cedars, MD. Call in 2 weeks.   Specialty: Orthopedic Surgery Why: [phone] Contact information: [address] [place] [address] [place] [address] [phone]        Newt Minion, MD In 1 week.   Specialty: Orthopedic Surgery Contact information: [place] [address] [phone]        Jamie Pretty, MD Follow up.   Specialty: Internal Medicine Contact information: [address] [place] [address] [phone]        [name], MD .   Specialty: Cardiology Contact information: [address] [place] [address] [phone]          Allergies  Allergen Reactions  . Lipitor [Atorvastatin] Other (See Comments)    Stiff joints  . 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 Warfarin And Related Other (See Comments)    Stiff joints  . Enbrel [Etanercept] Itching      The results of significant diagnostics from this hospitalization (including imaging, microbiology, ancillary and laboratory) are listed below for reference.    Labs: BNP (last 3 results) Recent Labs    05/16/19 0609  BNP [id number]*   Basic Metabolic Panel: Recent Labs  Lab 02/05/20 0203 02/06/20 0509 02/07/20 0345 02/08/20 0649 02/09/20 0432  NA 140 138 136 137 137  K 4.3 3.9 4.4 3.9 5.0  CL 103 102 102 104 103  CO2 26 28 26 25 26   GLUCOSE 126* 99 97 102* 107*  BUN 23 37* 33* 27* 31*  CREATININE 1.24 1.41* 1.22 1.10 1.18  CALCIUM 8.9 8.4* 8.2* 8.6* 8.9  MG  --  2.0  --  2.2  --    Liver Function Tests: No results for input(s): AST, ALT, ALKPHOS, BILITOT, PROT, ALBUMIN in the last 168 hours. No results for input(s): LIPASE, AMYLASE in the last 168 hours. No results for input(s): AMMONIA in the last 168 hours. CBC: Recent Labs  Lab 02/05/20 0203 02/06/20 0509 02/07/20 0345 02/08/20 0649 02/09/20 0432  WBC 6.2 5.4 4.2 4.4 3.3*  HGB 11.2* 10.2* 8.9* 9.2* 9.4*  HCT 34.1* 31.4* 26.9* 28.0* 29.0*  MCV 94.5 94.6 94.4 93.6 96.3  PLT 97* 87* 68* 82* 70*   Cardiac Enzymes: No results for input(s): CKTOTAL, CKMB, CKMBINDEX, TROPONINI in the last 168 hours. BNP: Invalid input(s): POCBNP CBG: Recent Labs  Lab 02/04/20 1751 02/06/20 0952 02/06/20 1301  GLUCAP 98 85 93   D-Dimer No results for input(s): DDIMER in the last 72 hours. Hgb A1c No results for input(s): HGBA1C in the last 72 hours. Lipid Profile No results for input(s): CHOL, HDL, LDLCALC, TRIG, CHOLHDL, LDLDIRECT in the last 72 hours. Thyroid function studies No results for input(s): TSH, T4TOTAL, T3FREE, THYROIDAB in the last 72 hours.  Invalid input(s): FREET3 Anemia work up No results for input(s): VITAMINB12, FOLATE, FERRITIN, TIBC, IRON, RETICCTPCT in the last 72  hours. Urinalysis    Component Value Date/Time   COLORURINE YELLOW 02/06/2020 0831  APPEARANCEUR CLEAR 02/06/2020 0831   LABSPEC 1.015 02/06/2020 0831   PHURINE 5.0 02/06/2020 0831   GLUCOSEU NEGATIVE 02/06/2020 0831   HGBUR NEGATIVE 02/06/2020 0831   BILIRUBINUR NEGATIVE 02/06/2020 0831   KETONESUR NEGATIVE 02/06/2020 0831   PROTEINUR NEGATIVE 02/06/2020 0831   UROBILINOGEN 0.2 01/07/2008 1137   NITRITE NEGATIVE 02/06/2020 0831   LEUKOCYTESUR NEGATIVE 02/06/2020 0831   Sepsis Labs Invalid input(s): PROCALCITONIN,  WBC,  LACTICIDVEN Microbiology Recent Results (from the past 240 hour(s))  Respiratory Panel by RT PCR (Flu A&B, Covid) - Nasopharyngeal Swab     Status: None   Collection Time: 02/04/20 12:44 AM   Specimen: Nasopharyngeal Swab  Result Value Ref Range Status   SARS Coronavirus 2 by RT PCR NEGATIVE NEGATIVE Final    Comment: (NOTE) SARS-CoV-2 target nucleic acids are NOT DETECTED. The SARS-CoV-2 RNA is generally detectable in upper respiratoy specimens during the acute phase of infection. The lowest concentration of SARS-CoV-2 viral copies this assay can detect is 131 copies/mL. A negative result does not preclude SARS-Cov-2 infection and should not be used as the sole basis for treatment or other patient management decisions. A negative result may occur with  improper specimen collection/handling, submission of specimen other than nasopharyngeal swab, presence of viral mutation(s) within the areas targeted by this assay, and inadequate number of viral copies (<131 copies/mL). A negative result must be combined with clinical observations, patient history, and epidemiological information. The expected result is Negative. Fact Sheet for Patients:  PinkCheek.be Fact Sheet for Healthcare Providers:  GravelBags.it This test is not yet ap proved or cleared by the [place] FDA and  has been authorized for  detection and/or diagnosis of SARS-CoV-2 by FDA under an Emergency Use Authorization (EUA). This EUA will remain  in effect (meaning this test can be used) for the duration of the COVID-19 declaration under Section 564(b)(1) of the Act, 21 U.S.C. section 360bbb-3(b)(1), unless the authorization is terminated or revoked sooner.    Influenza A by PCR NEGATIVE NEGATIVE Final   Influenza B by PCR NEGATIVE NEGATIVE Final    Comment: (NOTE) The Xpert Xpress SARS-CoV-2/FLU/RSV assay is intended as an aid in  the diagnosis of influenza from Nasopharyngeal swab specimens and  should not be used as a sole basis for treatment. Nasal washings and  aspirates are unacceptable for Xpert Xpress SARS-CoV-2/FLU/RSV  testing. Fact Sheet for Patients: PinkCheek.be Fact Sheet for Healthcare Providers: GravelBags.it This test is not yet approved or cleared by the [place] FDA and  has been authorized for detection and/or diagnosis of SARS-CoV-2 by  FDA under an Emergency Use Authorization (EUA). This EUA will remain  in effect (meaning this test can be used) for the duration of the  Covid-19 declaration under Section 564(b)(1) of the Act, 21  U.S.C. section 360bbb-3(b)(1), unless the authorization is  terminated or revoked. Performed at [facility], [place] [address] [place], [place] [address]   Surgical pcr screen     Status: Abnormal   Collection Time: 02/04/20 10:03 AM   Specimen: Nasal Mucosa; Nasal Swab  Result Value Ref Range Status   MRSA, PCR NEGATIVE NEGATIVE Final   Staphylococcus aureus POSITIVE (A) NEGATIVE Final    Comment: (NOTE) The Xpert SA Assay (FDA approved for NASAL specimens in patients 4 years of age and older), is one component of a comprehensive surveillance program. It is not intended to diagnose infection nor to guide or monitor treatment. Performed at [facility], [place]  [address] [place],  [place] [address]     Procedures/Studies: DG Chest 1 View  Result Date: 02/04/2020 CLINICAL DATA:  Fall EXAM: CHEST  1 VIEW COMPARISON:  05/15/2019 FINDINGS: The heart size and mediastinal contours are within normal limits. Both lungs are clear. The visualized skeletal structures are unremarkable. Median sternotomy and left chest wall pacemaker. IMPRESSION: No active disease. Electronically Signed   By: Ulyses Jarred M.D.   On: 02/04/2020 00:37   DG Elbow 2 Views Right  Result Date: 02/04/2020 CLINICAL DATA:  Right elbow and hip pain after fall EXAM: RIGHT ELBOW - 2 VIEW COMPARISON:  None. FINDINGS: There is no evidence of fracture, dislocation, or joint effusion. There is no evidence of arthropathy or other focal bone abnormality. Soft tissues are unremarkable. IMPRESSION: Negative. Electronically Signed   By: Ulyses Jarred M.D.   On: 02/04/2020 00:29   CT Head Wo Contrast  Result Date: 02/04/2020 CLINICAL DATA:  Fall EXAM: CT HEAD WITHOUT CONTRAST CT CERVICAL SPINE WITHOUT CONTRAST TECHNIQUE: Multidetector CT imaging of the head and cervical spine was performed following the standard protocol without intravenous contrast. Multiplanar CT image reconstructions of the cervical spine were also generated. COMPARISON:  None. FINDINGS: CT HEAD FINDINGS Brain: There is no mass, hemorrhage or extra-axial collection. The size and configuration of the ventricles and extra-axial CSF spaces are normal. Areas of hypoattenuation of the deep gray nuclei and confluent periventricular white matter hypodensity, consistent with chronic small vessel disease. 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 Vascular: No abnormal hyperdensity of the major intracranial arteries or dural venous sinuses. No intracranial atherosclerosis. Unchanged calcification along the course of the left M2 segment. Skull: Remote right-sided craniotomy. Sinuses/Orbits: No fluid levels or advanced mucosal thickening of the visualized  paranasal sinuses. No mastoid or middle ear effusion. The orbits are normal. CT CERVICAL SPINE FINDINGS Alignment: No static subluxation. Facets are aligned. Occipital condyles are normally positioned. Skull base and vertebrae: No acute fracture. Soft tissues and spinal canal: No prevertebral fluid or swelling. No visible canal hematoma. Disc levels: No bony spinal canal stenosis. Multilevel mild vertebral body height loss, particularly at C3 and C4. Facet hypertrophy is worst at right C3-4 and left C4-5. Upper chest: No pneumothorax, pulmonary nodule or pleural effusion. Other: Normal visualized paraspinal cervical soft tissues. IMPRESSION: 1. Chronic ischemic microangiopathy without acute intracranial abnormality. 2. No acute fracture or static subluxation of the cervical spine. Electronically Signed   By: Ulyses Jarred M.D.   On: 02/04/2020 00:54   CT Cervical Spine Wo Contrast  Result Date: 02/04/2020 CLINICAL DATA:  Fall EXAM: CT HEAD WITHOUT CONTRAST CT CERVICAL SPINE WITHOUT CONTRAST TECHNIQUE: Multidetector CT imaging of the head and cervical spine was performed following the standard protocol without intravenous contrast. Multiplanar CT image reconstructions of the cervical spine were also generated. COMPARISON:  None. FINDINGS: CT HEAD FINDINGS Brain: There is no mass, hemorrhage or extra-axial collection. The size and configuration of the ventricles and extra-axial CSF spaces are normal. Areas of hypoattenuation of the deep gray nuclei and confluent periventricular white matter hypodensity, consistent with chronic small vessel disease. 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 Vascular: No abnormal hyperdensity of the major intracranial arteries or dural venous sinuses. No intracranial atherosclerosis. Unchanged calcification along the course of the left M2 segment. Skull: Remote right-sided craniotomy. Sinuses/Orbits: No fluid levels or advanced mucosal thickening of the visualized  paranasal sinuses. No mastoid or middle ear effusion. The orbits are normal. CT CERVICAL SPINE FINDINGS Alignment: No  static subluxation. Facets are aligned. Occipital condyles are normally positioned. Skull base and vertebrae: No acute fracture. Soft tissues and spinal canal: No prevertebral fluid or swelling. No visible canal hematoma. Disc levels: No bony spinal canal stenosis. Multilevel mild vertebral body height loss, particularly at C3 and C4. Facet hypertrophy is worst at right C3-4 and left C4-5. Upper chest: No pneumothorax, pulmonary nodule or pleural effusion. Other: Normal visualized paraspinal cervical soft tissues. IMPRESSION: 1. Chronic ischemic microangiopathy without acute intracranial abnormality. 2. No acute fracture or static subluxation of the cervical spine. Electronically Signed   By: Ulyses Jarred M.D.   On: 02/04/2020 00:54   DG Foot 2 Views Right  Result Date: 02/04/2020 CLINICAL DATA:  First and second toe infection for 1 week, initial encounter EXAM: RIGHT FOOT - 2 VIEW COMPARISON:  None. FINDINGS: Degenerative changes of the tarsal bones are noted. No definitive bony erosive changes are seen to suggest osteomyelitis. Mild soft tissue swelling is seen. No fractures are noted. IMPRESSION: No findings to suggest osteomyelitis. Electronically Signed   By: Inez Catalina M.D.   On: 02/04/2020 16:36   DG C-Arm 1-60 Min  Result Date: 02/04/2020 CLINICAL DATA:  ORIF proximal right femur fracture EXAM: DG C-ARM 1-60 MIN; RIGHT FEMUR 2 VIEWS COMPARISON:  02/04/2020 right hip radiographs FLUOROSCOPY TIME:  Fluoroscopy Time:  1 minutes 0 seconds Number of Acquired Spot Images: 4 FINDINGS: Multiple nondiagnostic spot fluoroscopic intraoperative right hip radiographs demonstrate transfixation of intertrochanteric proximal right femur fracture by intramedullary rod with interlocking proximal right femoral neck pin and distal interlocking screw. IMPRESSION: Intraoperative fluoroscopic guidance  for ORIF intertrochanteric proximal right femur fracture. Electronically Signed   By: Ilona Sorrel M.D.   On: 02/04/2020 19:25   DG Hip Unilat W or Wo Pelvis 2-3 Views Right  Result Date: 02/04/2020 CLINICAL DATA:  Right elbow and hip pain after fall EXAM: DG HIP (WITH OR WITHOUT PELVIS) 2-3V RIGHT COMPARISON:  None. FINDINGS: Nondisplaced intertrochanteric fracture, best seen on cross-table lateral view. No dislocation. No pelvic fracture. IMPRESSION: Nondisplaced right intertrochanteric fracture. Electronically Signed   By: Ulyses Jarred M.D.   On: 02/04/2020 00:34   DG FEMUR, MIN 2 VIEWS RIGHT  Result Date: 02/04/2020 CLINICAL DATA:  ORIF proximal right femur fracture EXAM: DG C-ARM 1-60 MIN; RIGHT FEMUR 2 VIEWS COMPARISON:  02/04/2020 right hip radiographs FLUOROSCOPY TIME:  Fluoroscopy Time:  1 minutes 0 seconds Number of Acquired Spot Images: 4 FINDINGS: Multiple nondiagnostic spot fluoroscopic intraoperative right hip radiographs demonstrate transfixation of intertrochanteric proximal right femur fracture by intramedullary rod with interlocking proximal right femoral neck pin and distal interlocking screw. IMPRESSION: Intraoperative fluoroscopic guidance for ORIF intertrochanteric proximal right femur fracture. Electronically Signed   By: Ilona Sorrel M.D.   On: 02/04/2020 19:25    Time coordinating discharge: Over 30 minutes  SIGNED:   Guilford Shi, MD  [facility] 02/09/2020, 12:42 PM Pager : [phone]

## 2020-02-09 NOTE — Progress Notes (Signed)
Charlett Blake, MD  Physician  Physical Medicine and Rehabilitation  PMR Pre-admission  Signed  Date of Service:  02/06/2020  7:01 PM      Related encounter: ED to Hosp-Admission (Discharged) from 02/03/2020 in [place]        Show:Clear all '[x]' Manual'[x]' Template'[x]' Copied  Added by: '[x]' Demarrion Meiklejohn, Vertis Kelch, RN'[x]' Kirsteins, Luanna Salk, MD  '[]' Hover for details PMR Admission Coordinator Pre-Admission Assessment   Patient: [name]. is an 79 y.o., male MRN: [id number] DOB: 02-07-1941 Height: '5\' 7"'  (170.2 cm) Weight: 81.1 kg   Insurance Information HMO:     PPO:      PCP:      IPA:      80/20:      OTHER:  PRIMARY: Medicare a and b      Policy#: [id number]      Subscriber: pt Benefits:  Phone #: passport one online     Name: 2/19 Eff. Date: a 12/19/2007 and b 1/1/ 2012     Deduct: $1484      Out of Pocket Max: none      Life Max: none CIR: 100%      SNF: 20 full days Outpatient: 80%     Co-Pay: 20% Home Health: 100%      Co-Pay: none DME: 80%     Co-Pay: 20% Providers: pt choice  SECONDARY: AARP supplement      Policy#: [id number]      Subscriber: pt   Medicaid Application Date:       Case Manager:  Disability Application Date:       Case Worker:    The "Data Collection Information Summary" for patients in Inpatient Rehabilitation Facilities with attached "Privacy Act [place] Records" was provided and verbally reviewed with: Patient and Family   Emergency Contact Information         Contact Information     Name Relation Home Work Mobile    Prevo,Vickie Spouse [phone]   [place] Daughter     [phone]         Current Medical History  Patient Admitting Diagnosis: fall; debility with right femur fracture   History of Present Illness:  79 year old right-handed male with history of chronic diastolic congestive heart failure atrial fibrillation on Coumadin in the past  discontinued 2014 due to 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  Patient received inpatient rehab service in June 2020 for debilitation related to CHF.    Presented 02/04/2020 after ground-level fall without loss of consciousness sustaining a right intertrochanteric femur fracture as well as undisplaced fracture involving the lamina bilaterally at C6.[name]  Underwent closed intramedullary nailing of right femur fracture 02/04/2020 per Dr. [name] as well as findings of osteomyelitis right second toe with amputation 02/06/2020 per Dr. [name].  Patient is partial weightbearing 25% right lower extremity.  Patient was placed conservatively in a cervical collar for undisplaced fracture lamina of C6.  Hospital course acute blood loss anemia 10.2.  Maintained on Lovenox for DVT prophylaxis.    Some orthostasis with PAF with sick sinus dysfunction with history of pacemaker. Not on anticoagulation due to history of spontaneous SDH in 2013. B Blocker dosage adjusted over the weekend in concern for low normal BP. 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 BP noted to stable around systolic 174Y and  HR in 60s . Chronic right sided CHF. Diuretics of Demedex and Lasix held in concern for low BP. He has periodically received 500 cc fluid boluses due to soft BP. To resume diuretics as needed use possibly based on BP tolerance.      Patient's medical record from [facility] has been reviewed by the rehabilitation admission coordinator and physician.   Past Medical History      Past Medical History:  Diagnosis Date  . (HFpEF) heart failure with preserved ejection fraction ([place])      a. 05/2013 Echo: EF 55%, mild LVH, diast dysfxn, Ao sclerosis, mildly dil LA, RV dysfxn (poorly visualized), PASP 73mHg;  b. 03/2017 Echo: EF 55-60%, no rwma, triv MR, mildly dil RV,  mod TR, PASP 475mg.  [facility]     s[facility]p [facility] 1/09;  Multaq Rx d/c'd in 2014 due to pulmo fibrosis;  coumadin d/c'd in 2014 due to spontaneous subdural hematoma  . BPH (benign prostatic hyperplasia)    . CAD (coronary artery disease), native coronary artery      a. s/p CABG 12/2007;  b. Myoview 12/2011: EF 66%, no scar or ischemia; normal.  . DM (diabetes mellitus) [place]   . Hyperlipidemia type II    . Hypertension    . 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 OSA (obstructive sleep apnea)    . Pacemaker      PPM - [employer] Jude  . Peripheral neuropathy 07/31/2018  . Pulmonary fibrosis ([facility]     Multaq d/c'd 7/14  . Sub[place]    spontaneous;  coumadin d/c'd => no longer a candidate for anticoagulation      Family History   family history includes Alzheimer's disease in his mother; Dementia in his mother; Heart attack in his father; Heart disease in his father and mother; Heart failure in his father.   Prior Rehab/Hospitalizations Has the patient had prior rehab or hospitalizations prior to admission? Yes   Has the patient had major surgery during 100 days prior to admission? Yes              Current Medications   Current Facility-Administered Medications:  .  acetaminophen (TYLENOL) tablet 325-650 mg, 325-650 mg, Oral, Q6H PRN, Persons, MaBevely PalmerPA .  bisacodyl (DULCOLAX) suppository 10 mg, 10 mg, Rectal, Daily PRN, Persons, MaBevely PalmerPA .  chlorhexidine (PERIDEX) 0.12 % solution 15 mL, 15 mL, Mouth Rinse, BID, Persons, MaBevely PalmerPA, 15 mL at 02/09/20 0906 .  docusate sodium (COLACE) capsule 100 mg, 100 mg, Oral, BID, Persons, MaBevely PalmerPA, 100 mg at 02/09/20 0906 .  enoxaparin (LOVENOX) injection 40 mg, 40 mg, Subcutaneous, Q24H, Persons, MaBevely PalmerPA .  feeding supplement (ENSURE ENLIVE) (ENSURE ENLIVE) liquid 237 mL, 237 mL, Oral, QHS, Persons, [place]evely [place]237 mL at 02/08/20 2156 .  ferrous sulfate  tablet 325 mg, 325 mg, Oral, Q breakfast, Persons, MaBevely [place]PA[place]325 mg at 02/09/20 0905 .  HYDROcodone-acetaminophen (NORCO/VICODIN) 5-325 MG per tablet 1-2 tablet, 1-2 tablet, Oral, Q4H PRN, Persons, Ma[place]evely [place]2 tablet at 02/09/20 0905 .  HYDROmorphone (DILAUDID) injection 0.5 mg, 0.5 mg, Intravenous, Q4H PRN, Persons, MaBevely PalmerPA .  MEDLINE mouth rinse, 15 mL, Mouth Rinse, q12n4p, Persons, MaBevely PalmerPA, 15 mL at 02/08/20 1723 .  menthol-cetylpyridinium (CEPACOL) lozenge 3 mg, 1 lozenge, Oral, PRN **OR** phenol (CHLORASEPTIC) mouth spray 1 spray, 1  spray, Mouth/Throat, PRN, Persons, Bevely Palmer, [place] .  methocarbamol (ROBAXIN) tablet 500 mg, 500 mg, Oral, Q6H PRN, 500 mg at 02/08/20 1524 **OR** methocarbamol (ROBAXIN) 500 mg in dextrose 5 % 50 mL IVPB, 500 mg, Intravenous, Q6H PRN, Persons, Bevely Palmer, PA .  metoCLOPramide (REGLAN) tablet 5-10 mg, 5-10 mg, Oral, Q8H PRN **OR** metoCLOPramide (REGLAN) injection 5-10 mg, 5-10 mg, Intravenous, Q8H PRN, Persons, Bevely Palmer, PA .  metoprolol succinate (TOPROL-XL) 24 hr tablet 12.5 mg, 12.5 mg, Oral, Daily, Kamineni, Neelima, MD .  mirabegron ER (MYRBETRIQ) tablet 50 mg, 50 mg, Oral, Daily, Persons, Bevely Palmer, PA, 50 mg at 02/09/20 0906 .  morphine 2 MG/ML injection 1-2 mg, 1-2 mg, Intravenous, Q3H PRN, Persons, Bevely Palmer, PA, 1 mg at 02/05/20 1406 .  multivitamin with minerals tablet 1 tablet, 1 tablet, Oral, Daily, Persons, Bevely Palmer, [place], 1 tablet at 02/09/20 0906 .  nutrition supplement (JUVEN) (JUVEN) powder packet 1 packet, 1 packet, Oral, BID BM, Persons, Bevely Palmer, [place], 1 packet at 02/09/20 [phone] .  ondansetron (ZOFRAN) injection 4 mg, 4 mg, Intravenous, Q6H PRN, Persons, Bevely Palmer, PA .  ondansetron (ZOFRAN) tablet 4 mg, 4 mg, Oral, Q6H PRN **OR** ondansetron (ZOFRAN) injection 4 mg, 4 mg, Intravenous, Q6H PRN, Persons, Bevely Palmer, PA .  pantoprazole (PROTONIX) EC tablet 40 mg, 40 mg, Oral, Daily, Persons, Bevely Palmer, [place], 40 mg at 02/09/20 0906 .   polyethylene glycol (MIRALAX / GLYCOLAX) packet 17 g, 17 g, Oral, Daily PRN, Persons, Bevely Palmer, PA .  potassium chloride SA (KLOR-CON) CR tablet 20 mEq, 20 mEq, Oral, Daily, Florencia Reasons, MD, 20 mEq at 02/09/20 0906 .  rosuvastatin (CRESTOR) tablet 40 mg, 40 mg, Oral, QPM, Persons, Bevely Palmer, PA, 40 mg at 02/08/20 1722 .  senna-docusate (Senokot-S) tablet 1 tablet, 1 tablet, Oral, QHS PRN, Persons, Bevely Palmer, [place], 1 tablet at 02/06/20 2139 .  senna-docusate (Senokot-S) tablet 1 tablet, 1 tablet, Oral, QHS, Persons, Bevely Palmer, [place], 1 tablet at 02/08/20 2156 .  sertraline (ZOLOFT) tablet 200 mg, 200 mg, Oral, Daily, Persons, Bevely Palmer, [place], 200 mg at 02/09/20 0906 .  tamsulosin (FLOMAX) capsule 0.4 mg, 0.4 mg, Oral, QHS, Florencia Reasons, MD, 0.4 mg at 02/08/20 2155   Patients Current Diet:     Diet Order                      Diet Carb Modified Fluid consistency: Thin; Room service appropriate? Yes  Diet effective now                   Precautions / Restrictions Precautions Precautions: Fall Restrictions Weight Bearing Restrictions: Yes RLE Weight Bearing: Touchdown weight bearing RLE Partial Weight Bearing Percentage or Pounds: 25%    Has the patient had 2 or more falls or a fall with injury in the past year? Yes   Prior Activity Level Limited Community (1-2x/wk): Mod I with SPC but alot of falls   Prior Functional Level Self Care: Did the patient need help bathing, dressing, using the toilet or eating? Needed some help   Indoor Mobility: Did the patient need assistance with walking from room to room (with or without device)? Independent   Stairs: Did the patient need assistance with internal or external stairs (with or without device)? Independent   Functional Cognition: Did the patient need help planning regular tasks such as shopping or remembering to take medications? Needed some help   Home Assistive Devices / [place]  Assistive Devices/Equipment: Kasandra Knudsen (specify quad or  straight), Walker (specify type), Hearing aid Home Equipment: Walker - 2 wheels, Wheelchair - manual, Bedside commode, Shower seat - built in, [place] - single point   Prior Device Use: Indicate devices/aids used by the patient prior to current illness, exacerbation or injury? spc   Current Functional Level Cognition   Overall Cognitive Status: Within Functional Limits for tasks assessed Orientation Level: Oriented X4 General Comments: noted hx of cognitive deficits in chart, pt was appropriate throughout session without deficits noted. will continue to assess    Extremity Assessment (includes Sensation/Coordination)   Upper Extremity Assessment: Generalized weakness  Lower Extremity Assessment: Defer to PT evaluation RLE Deficits / Details: decreased ROM and strength post op     ADLs   Overall ADL's : Needs assistance/impaired Eating/Feeding: Set up, Sitting Grooming: Minimal assistance, Cueing for safety, Standing, Oral care Grooming Details (indicate cue type and reason): standing at sink with min A +2; +2 needed to maintain WB precaution with therapist foot under pt foot. Pt needing external support from hanging onto counter Upper Body Bathing: Minimal assistance, Sitting Lower Body Bathing: Sitting/lateral leans, Sit to/from stand, Maximal assistance Upper Body Dressing : Moderate assistance, Sitting Lower Body Dressing: Maximal assistance, Sitting/lateral leans, Sit to/from stand Toilet Transfer: Minimal assistance, +2 for physical assistance, +2 for safety/equipment, RW Toileting- Clothing Manipulation and Hygiene: Moderate assistance, Maximal assistance, Sit to/from stand Functional mobility during ADLs: Minimal assistance, +2 for physical assistance, +2 for safety/equipment, Rolling walker General ADL Comments: pt limited by RLE pain and precautions     Mobility   Overal bed mobility: Needs Assistance Bed Mobility: Supine to Sit Supine to sit: Mod assist, +2 for physical  assistance, HOB elevated General bed mobility comments: HOB 30 degrees with assist to move RLE and elevate trunk, increased time and effort     Transfers   Overall transfer level: Needs assistance Equipment used: Rolling walker (2 wheeled) Transfers: Sit to/from Stand Sit to Stand: Min assist, +2 physical assistance General transfer comment: cues for safe hand placement; therapist placed foor under pt foot to monitor PWB     Ambulation / Gait / Stairs / Wheelchair Mobility   Ambulation/Gait Ambulation/Gait assistance: Herbalist (Feet): 18 Feet Assistive device: Rolling walker (2 wheeled) Gait Pattern/deviations: Step-to pattern, Decreased stride length General Gait Details: pt maintaining NWB RLE with gait this session with pt able to hop with short hops in room to limit strain to cervical spine and increase distance. Returned to bed for pending sx Gait velocity interpretation: <1.8 ft/sec, indicate of risk for recurrent falls     Posture / Balance Dynamic Sitting Balance Sitting balance - Comments: pt able to sit EOB without assist Balance Overall balance assessment: History of Falls, Needs assistance Sitting balance-Leahy Scale: Fair Sitting balance - Comments: pt able to sit EOB without assist Standing balance-Leahy Scale: Fair Standing balance comment: pt able to stand on LLE at sink with and without UE support with TDWB on RLE, RW for gait     Special needs/care consideration BiPAP/CPAP  Yes CPAP pta. Has not been using CPAP while hospitalized due to he does not like the fit of the hospital CPAP CPM  Continuous Drip IV  Dialysis         Life Vest  Oxygen  Special Bed  Trach Size  Wound Vac  Skin surgical sites for toe and for hip surgery Bowel mgmt:  continent Bladder mgmt: external catheter Diabetic mgmt:  Behavioral consideration  Chemo/radiation  Designated visitor is wife, Vickie    Previous Home Environment  Living Arrangements:  Spouse/significant other  Lives With: Spouse Available Help at Discharge: Family, Available 24 hours/day Type of Home: House Home Layout: One level Home Access: Stairs to enter Entrance Stairs-Rails: Right, Left, Can reach both Entrance Stairs-Number of Steps: 3 Bathroom Shower/Tub: Gaffer, Charity fundraiser: Standard Bathroom Accessibility: Yes How Accessible: Accessible via walker [place]: ([place] [address] last year) Additional Comments: currently in aspen collar from previous cervical injury   Discharge Living Setting Plans for Discharge Living Setting: Patient's home, Lives with (comment)(wife) Type of Home at Discharge: House Discharge Home Layout: One level Discharge Home Access: Stairs to enter Entrance Stairs-Rails: Right, Left, Can reach both Entrance Stairs-Number of Steps: 3 Discharge Bathroom Shower/Tub: Walk-in shower, Door Discharge Bathroom Toilet: Standard Discharge Bathroom Accessibility: Yes How Accessible: Accessible via walker Does the patient have any problems obtaining your medications?: No   Social/Family/Support Systems Patient Roles: Spouse, Parent Contact Information: wife, Vickie Anticipated Caregiver: wife Anticipated Ambulance person Information: see above Ability/Limitations of Caregiver: superivison to min assist level Caregiver Availability: 24/7 Discharge Plan Discussed with Primary Caregiver: Yes Is Caregiver In Agreement with Plan?: No Does Caregiver/Family have Issues with Lodging/Transportation while Pt is in Rehab?: No   Goals/Additional Needs Patient/Family Goal for Rehab: supervision to min asisst with PT and OT Expected length of stay: ELOS 2 weeks Pt/Family Agrees to Admission and willing to participate: Yes Program Orientation Provided & Reviewed with Pt/Caregiver Including Roles  & Responsibilities: Yes   Decrease burden of Care through IP rehab admission:    Possible need for SNF  placement upon discharge:    Patient Condition: I have reviewed medical records from [facility] , spoken with  patient and spouse. I met with patient at the bedside for inpatient rehabilitation assessment.  Patient will benefit from ongoing PT and OT, can actively participate in 3 hours of therapy a day 5 days of the week, and can make measurable gains during the admission.  Patient will also benefit from the coordinated team approach during an Inpatient Acute Rehabilitation admission.  The patient will receive intensive therapy as well as Rehabilitation physician, nursing, social worker, and care management interventions.  Due to bladder management, bowel management, safety, skin/wound care, disease management, medication administration, pain management and patient education the patient requires 24 hour a day rehabilitation nursing.  The patient is currently mod assist with mobility and basic ADLs.  Discharge setting and therapy post discharge at home with home health is anticipated.  Patient has agreed to participate in the Acute Inpatient Rehabilitation Program and will admit today.   Preadmission Screen Completed By:  Cleatrice Burke, 02/09/2020 12:16 PM ______________________________________________________________________   Discussed status with Dr. Letta Pate on  02/09/2020 at  1216 and received approval for admission today.   Admission Coordinator:  Cleatrice Burke, RN, time  6759 Date 02/09/2020   Assessment/Plan: Diagnosis: Right intertrochanteric femur fracture due to fall 02/04/2020, status post IM nail with partial weightbearing 1. Does the need for close, 24 hr/day Medical supervision in concert with the patient's rehab needs make it unreasonable for this patient to be served in a less intensive setting? Yes 2. Co-Morbidities requiring supervision/potential complications: History of stroke, history of diastolic congestive heart failure, history of atrial  fibrillation 3. Due to bladder management, bowel management, safety, skin/wound care, disease management, medication administration, pain management and patient education, does the patient require  24 hr/day rehab nursing? Yes 4. Does the patient require coordinated care of a physician, rehab nurse, PT, OT, and SLP to address physical and functional deficits in the context of the above medical diagnosis(es)? Yes Addressing deficits in the following areas: balance, endurance, locomotion, strength, transferring, bowel/bladder control, bathing, dressing, feeding, grooming, toileting and psychosocial support 5. Can the patient actively participate in an intensive therapy program of at least 3 hrs of therapy 5 days a week? Yes 6. The potential for patient to make measurable gains while on inpatient rehab is good 7. Anticipated functional outcomes upon discharge from inpatient rehab: supervision PT, supervision and min assist OT, n/a SLP 8. Estimated rehab length of stay to reach the above functional goals is: 14 to 16 days 9. Anticipated discharge destination: Home 10. Overall Rehab/Functional Prognosis: good     MD Signature: "I have personally performed a face to face diagnostic evaluation of this patient.  Additionally, I have reviewed and concur with the physician assistant's documentation above."          Revision History

## 2020-02-09 NOTE — Progress Notes (Signed)
Patient to transfer to [phone]. Report given to [facility]. Dressing changed to Rt foot.

## 2020-02-09 NOTE — Progress Notes (Signed)
Patient arrived to unit and oriented to routines. Verbalized understanding for need to call for assistance. No complaints at this time. Resting comfortably with call bell in reach

## 2020-02-09 NOTE — Progress Notes (Signed)
Physical Therapy Treatment Patient Details Name: [name]. MRN: [id number] DOB: 1941/10/10 Today's Date: 02/09/2020    History of Present Illness 79 yo admitted after fall with left femur fx s/p IM nail. Rt 2nd toe amp 2/19. Pt in cervical collar for healing fx. PMHx: SDH, pulmonary fibrosis, HTN, HLD, CAD, aFib, left 4th toe amputation    PT Comments    Pt remains pleasant, jovial and telling stories throughout session. Pt without report of foot pain and only hip pain with pt agreeable to mobility and medication requested. Pt with improved transfers and maintained TdWB throughout. Pt educated for HEP and continued progression.   HR 70 SpO2 99% on RA    Follow Up Recommendations  CIR;Supervision for mobility/OOB     Equipment Recommendations  None recommended by PT    Recommendations for Other Services       Precautions / Restrictions Precautions Precautions: Fall Required Braces or Orthoses: Cervical Brace Cervical Brace: Hard collar Restrictions RLE Weight Bearing: Touchdown weight bearing    Mobility  Bed Mobility Overal bed mobility: Needs Assistance Bed Mobility: Supine to Sit     Supine to sit: Min assist;HOB elevated     General bed mobility comments: min assist to move RLE toward EOB with cues, rail and increased time  Transfers Overall transfer level: Needs assistance   Transfers: Sit to/from Stand Sit to Stand: Min assist;From elevated surface         General transfer comment: cues for hand placement with monitoring for TDWB with rise from bed and lowering to chair  Ambulation/Gait Ambulation/Gait assistance: Min assist Gait Distance (Feet): 4 Feet Assistive device: Rolling walker (2 wheeled) Gait Pattern/deviations: Step-to pattern;Decreased stride length   Gait velocity interpretation: 1.31 - 2.62 ft/sec, indicative of limited community ambulator General Gait Details: pt able to hop from bed to chair but declined further mobility due  to pain   Stairs             Wheelchair Mobility    Modified Rankin (Stroke Patients Only)       Balance Overall balance assessment: History of Falls;Needs assistance     Sitting balance - Comments: pt able to sit EOB without assist     Standing balance-Leahy Scale: Fair                              Cognition Arousal/Alertness: Awake/alert Behavior During Therapy: WFL for tasks assessed/performed Overall Cognitive Status: Within Functional Limits for tasks assessed                                        Exercises General Exercises - Lower Extremity Long Arc Quad: AAROM;Right;Seated;15 reps Hip Flexion/Marching: AAROM;Right;Seated;15 reps    General Comments        Pertinent Vitals/Pain Pain Score: 8  Pain Location: right hip Pain Descriptors / Indicators: Aching;Guarding;Tender Pain Intervention(s): Limited activity within patient's tolerance;Monitored during session;Patient requesting pain meds-RN notified;Repositioned    Home Living                      Prior Function            PT Goals (current goals can now be found in the care plan section) Progress towards PT goals: Progressing toward goals    Frequency    Min 5X/week  PT Plan Current plan remains appropriate    Co-evaluation              AM-PAC PT "6 Clicks" Mobility   Outcome Measure  Help needed turning from your back to your side while in a flat bed without using bedrails?: A Little Help needed moving from lying on your back to sitting on the side of a flat bed without using bedrails?: A Little Help needed moving to and from a bed to a chair (including a wheelchair)?: A Little Help needed standing up from a chair using your arms (e.g., wheelchair or bedside chair)?: A Little Help needed to walk in hospital room?: A Little Help needed climbing 3-5 steps with a railing? : Total 6 Click Score: 16    End of Session Equipment  Utilized During Treatment: Gait belt Activity Tolerance: Patient tolerated treatment well Patient left: in chair;with call bell/phone within reach;with chair alarm set Nurse Communication: Mobility status;Precautions;Weight bearing status PT Visit Diagnosis: Other abnormalities of gait and mobility (R26.89);Difficulty in walking, not elsewhere classified (R26.2)     Time: [id number] PT Time Calculation (min) (ACUTE ONLY): 28 min  Charges:  $Therapeutic Exercise: 8-22 mins $Therapeutic Activity: 8-22 mins                     Vikki Gains P, PT Acute Rehabilitation Services Pager: [phone] Office: [phone]    Ramonita Koenig B Mario Voong 02/09/2020, 1:02 PM

## 2020-02-09 NOTE — Consult Note (Signed)
   [facility] CM Inpatient Consult   02/09/2020  Kaston Rafiel Doster Dec 30, 1940 [id number]   Patient screened for high risk score for unplanned readmission score and  to check if potential [place] Management services that maybe needed.  Review of patient's medical record reveals patient is for Comprehensive Inpatient Rehabilitation patient admitted with closed right hip fracture from a recent fall also with right 2nd toe amputation at the MTP jointed noted on 02/06/2020 Primary Care Provider is Dr. [name], this office provides their Transition of Care follow up.  Plan:  Continue to follow progress and disposition to assess for post hospital care management needs and let CIR be aware of patient is in network.  No current needs if patient admits to CIR.  Please place a [facility] Care Management consult as appropriate and for questions contact:   Natividad Brood, RN BSN [facility] Liaison  [phone] business mobile phone Toll free office [phone]  Fax number: [phone] [email or web address]

## 2020-02-09 NOTE — H&P (Signed)
Physical Medicine and Rehabilitation Admission H&P            Chief Complaint    Patient presents with    .   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  Patient received inpatient rehab service in June 2020 for debilitation related to CHF.  Per chart review patient lives with spouse.  Independent with assistive device.  1 level home 3 steps to entry.  Presented 02/04/2020 after ground-level fall without loss of consciousness sustaining a right intertrochanteric femur fracture as well as undisplaced fracture involving the lamina bilaterally at C6.[name]  Underwent closed intramedullary nailing of right femur fracture 02/04/2020 per Dr. 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 [name].  Patient is partial weightbearing 25% right lower extremity.  Patient was placed conservatively in a cervical collar for undisplaced fracture lamina of C6.  Hospital course acute blood loss anemia 9.2.  Blood pressure monitored bouts of orthostasis chronic diastolic congestive heart failure followed by cardiology services patient felt to be euvolemic home Demadex and Lasix were held.  He did receive a 500 cc bolus x1 on 2/18 and x2 on 2/19 and again repeated 2/21 maintained on Lovenox for DVT prophylaxis.  Therapy evaluations completed patient was admitted for a comprehensive rehab program.     Review of Systems   Constitutional: Negative for chills and fever.   HENT: Negative for hearing loss.    Eyes: Negative for blurred vision and double vision.    Respiratory: Negative for cough and shortness of breath.    Cardiovascular: Positive for palpitations and leg swelling. Negative for chest pain.   Gastrointestinal: Positive for constipation. Negative for heartburn, nausea and vomiting.   Genitourinary: Positive for urgency. Negative for dysuria, flank pain and hematuria.   Musculoskeletal: Positive for falls, joint pain and myalgias.   Skin: Negative for rash.   All other systems reviewed and are negative.          Past Medical History:    Diagnosis   Date    .   (HFpEF) heart failure with preserved ejection fraction ([place])            a. 05/2013 Echo: EF 55%, mild LVH, diast dysfxn, Ao sclerosis, mildly dil LA, RV dysfxn (poorly visualized), PASP 38mmHg;  b. 03/2017 Echo: EF 55-60%, no rwma, triv MR, mildly dil RV, mod TR, PASP 6mmHg.    .   Atrial fibrillation [facility])            s/p Cox Maze 1/09;  Multaq Rx d/c'd in 2014 due to pulmo fibrosis;  coumadin d/c'd in 2014 due to spontaneous subdural hematoma    .   BPH (benign prostatic hyperplasia)        .   CAD (coronary artery disease), native coronary artery            a. s/p CABG 12/2007;  b. Myoview 12/2011: EF 66%, no scar or ischemia; normal.    .   DM (diabetes mellitus) ([place])        .   Hyperlipidemia type II        .  Hypertension        .   [name] (monoclonal gammopathy of unknown significance)   07/31/2018        IgA    .   OSA (obstructive sleep apnea)        .   Pacemaker            PPM - St. Jude    .   Peripheral neuropathy   07/31/2018    .   Pulmonary fibrosis ([place])            Multaq d/c'd 7/14    .   Subdural hematoma ([place])   07/2012        spontaneous;  coumadin d/c'd => no longer a candidate for anticoagulation             Past Surgical History:    Procedure   Laterality   Date    .   AMPUTATION   Left   05/17/2019         Procedure: AMPUTATION LEFT FOURTH TOE;  Surgeon: Meredith Pel, MD;  Location: [place];  Service: Orthopedics;  Laterality: Left;    .   APPENDECTOMY            .   CHOLECYSTECTOMY            .   CORONARY ARTERY BYPASS GRAFT                x3 (left internal mammary artery to distal left anterior descending coronary artery, saphenous vain graft to second circumflex marginal branch, saphenous vain graft to posterior descending coronary artery, endoscopic saphenous vain harvest from right thigh) and modified Cox - Maze IV procedure.  [name],MD. Electronically signed CHO/MEDQ D: 01/09/2008/ JOB: [id number] cc:  [name]    .   [name]       07/30/2012        Procedure: CRANIOTOMY HEMATOMA EVACUATION SUBDURAL;  Surgeon: [name], MD;  Location: [place] [facility];  Service: Neurosurgery;  Laterality: Right;  Right craniotomy for evacuation of subdural hematoma    .   FOOT SURGERY            .   HERNIA REPAIR            .   INTRAMEDULLARY (IM) NAIL INTERTROCHANTERIC   Right   02/04/2020        Procedure: INTRAMEDULLARY (IM) NAIL INTERTROCHANTRIC;  Surgeon: Netta Cedars, MD;  Location: [place];  Service: Orthopedics;  Laterality: Right;    .   ORCHIECTOMY                Left  /  testicular cancer    .   PACEMAKER PLACEMENT                PPM - St. Jude    .   PPM GENERATOR CHANGEOUT   N/A   06/25/2019        Procedure: PPM GENERATOR CHANGEOUT;  Surgeon: Evans Lance, MD;  Location: [place] [facility];  Service: Cardiovascular;  Laterality: N/A;             Family History    Problem   Relation   Age of Onset    .   Heart disease   Father        .   Heart attack   Father        .   Heart failure  Father        .   Heart disease   Mother        .   Alzheimer's disease   Mother        .   Dementia   Mother            Social History:  reports that he quit smoking about 28 years ago. His smoking use included cigarettes. He has a 40.00 pack-year smoking history. He has never used smokeless tobacco. He reports that he does not drink alcohol or use drugs.  Allergies:         Allergies    Allergen   Reactions    .   Lipitor [Atorvastatin]   Other (See Comments)            Stiff joints    .   Nsaids   Other (See Comments)            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    .   Warfarin And Related   Other (See Comments)            Stiff joints    .   Enbrel [Etanercept]   Itching                 Facility-Administered Medications Prior to Admission    Medication   Dose   Route   Frequency   Provider   Last Rate   Last Admin    .   mupirocin ointment (BACTROBAN) 2 %       Topical   BID   Magnant, Charles L, PA-C                      Medications Prior to Admission    Medication   Sig   Dispense   Refill    .   allopurinol (ZYLOPRIM) 300 MG tablet   Take 300 mg by mouth daily.             .   furosemide (LASIX) 40 MG tablet   Take 60 mg by mouth 2 (two) times daily.             .   metoprolol tartrate (LOPRESSOR) 25 MG tablet   Take 1 tablet (25 mg total) by mouth 2 (two) times daily.   180 tablet   3    .   mirabegron ER (MYRBETRIQ) 50 MG TB24 tablet   Take 1 tablet (50 mg total) by mouth daily.   30 tablet   3    .   pantoprazole (PROTONIX) 40 MG tablet   Take 40 mg by mouth daily.            .   potassium chloride SA (K-DUR) 20 MEQ tablet   Take 1 tablet (20 mEq total) by mouth daily.   30 tablet   1    .   rosuvastatin (CRESTOR) 40 MG tablet   Take 1 tablet (40 mg total) by mouth daily. (Patient taking differently: Take 40 mg by mouth every evening. )   90 tablet   2    .   sertraline (ZOLOFT) 100 MG tablet   Take 200 mg by mouth daily.              [name]   torsemide (DEMADEX) 20 MG tablet   Take 1 tablet (20 mg total) by mouth 2 (two) times daily.   180 tablet   3    .  acetaminophen (TYLENOL) 325 MG tablet   Take 1-2 tablets (325-650 mg total) by mouth every 4 (four) hours as needed for mild pain.            .   tamsulosin (FLOMAX) 0.4 MG CAPS capsule   Take 0.4 mg by mouth at bedtime.                   Drug Regimen Review  Drug regimen was reviewed and remains appropriate with no significant issues identified     Home:  Home Living  Family/patient expects to be discharged to:: Private residence  Living Arrangements: Spouse/significant other  Available Help at Discharge: Family, Available 24 hours/day  Type of Home: House  Home Access: Stairs to enter  [employer] of Steps: 3  Entrance Stairs-Rails: Right, Left, Can reach both  Home Layout: One level  Bathroom Shower/Tub: Gaffer, Engineer, manufacturing systems: Standard  Home Equipment: Environmental consultant - 2 wheels, Wheelchair - manual, Bedside commode, Shower seat - built in, Rossiter - single point  Additional Comments: currently in aspen collar from previous cervical injury     Functional History:  Prior Function  Level of Independence: Independent with assistive device(s)  Comments: SPC usage, a lot of falls at home     Functional Status:   Mobility:  Bed Mobility  Overal bed mobility: Needs Assistance  Bed Mobility: Supine to Sit  Supine to sit: Mod assist, +2 for physical assistance, HOB elevated  General bed mobility comments: HOB 30 degrees with assist to move RLE and elevate trunk, increased time and effort  Transfers  Overall transfer level: Needs assistance  Equipment used: Rolling walker (2 wheeled)  Transfers: Sit to/from Stand  Sit to Stand: Min assist, +2 physical assistance  General transfer comment: cues for safe hand placement; therapist placed foor under pt foot to monitor  PWB  Ambulation/Gait  Ambulation/Gait assistance: Min assist  Gait Distance (Feet): 18 Feet  Assistive device: Rolling walker (2 wheeled)  Gait Pattern/deviations: Step-to pattern, Decreased stride length  General Gait Details: pt maintaining NWB RLE with gait this session with pt able to hop with short hops in room to limit strain to cervical spine and increase distance. Returned to bed for pending sx  Gait velocity interpretation: <1.8 ft/sec, indicate of risk for recurrent falls       ADL:  ADL  Overall ADL's : Needs assistance/impaired  Eating/Feeding: Set up, Sitting  Grooming: Minimal assistance, Cueing for safety, Standing, Oral care  Grooming Details (indicate cue type and reason): standing at sink with min A +2; +2 needed to maintain WB precaution with therapist foot under pt foot. Pt needing external support from hanging onto counter  Upper Body Bathing: Minimal assistance, Sitting  Lower Body Bathing: Sitting/lateral leans, Sit to/from stand, Maximal assistance  Upper Body Dressing : Moderate assistance, Sitting  Lower Body Dressing: Maximal assistance, Sitting/lateral leans, Sit to/from stand  Toilet Transfer: Minimal assistance, +2 for physical assistance, +2 for safety/equipment, RW  Toileting- Clothing Manipulation and Hygiene: Moderate assistance, Maximal assistance, Sit to/from stand  Functional mobility during ADLs: Minimal assistance, +2 for physical assistance, +2 for safety/equipment, Rolling walker  General ADL Comments: pt limited by RLE pain and precautions     Cognition:  Cognition  Overall Cognitive Status: Within Functional Limits for tasks assessed  Orientation Level: Oriented X4  Cognition  Arousal/Alertness: Awake/alert  Behavior During Therapy: WFL for tasks assessed/performed  Overall Cognitive Status: Within Functional Limits for tasks assessed  General  Comments: noted hx of cognitive deficits in chart, pt was  appropriate throughout session without deficits noted. will continue to assess     Physical Exam:  Blood pressure 99/60, pulse (!) 114, temperature 98.9 F (37.2 C), temperature source Oral, resp. rate 17, height 5\' 7"  (1.702 m), weight 81.1 kg, SpO2 93 %.  Physical Exam  Neurological:  Patient is alert in no acute distress.  Follows commands and oriented x3   Skin:  Right hip dressing in place as well as right foot surgical site appropriately tender     General: No acute distress Mood and affect are appropriate Heart: Regular rate and rhythm no rubs murmurs or extra sounds Lungs: Clear to auscultation, breathing unlabored, no rales or wheezes Abdomen: Positive bowel sounds, soft nontender to palpation, nondistended Extremities: No clubbing, cyanosis, or edema Skin: No evidence of breakdown, no evidence of rash Neurologic: Cranial nerves II through XII intact, motor strength is 5/5 in bilateral deltoid, bicep, tricep, grip, 4 - bilateral hip flexor, knee extensors, 4 - left ankle dorsiflexor and plantar flexor, right ankle could not be tested secondary to dressing Sensory exam reduced below the ankle in bilateral feet to light touch.  Has intact light touch above the ankle and in the upper limbs  Musculoskeletal: Right foot in [place] wrapping, right second toe amputation Normal range of motion in the upper extremities.    Lab Results Last 48 Hours  Imaging Results (Last 48 hours)                                                 Medical Problem List and Plan:  1.  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  Partial weightbearing 25% right lower extremity              -patient may  shower with wrapping over right foot and ankle              -ELOS/Goals: 14 to 16 days with supervision to min assist goals for ADLs and mobility  2.  Antithrombotics:  -DVT/anticoagulation: Lovenox.  Check vascular study               -antiplatelet therapy: N/A  3. Pain Management: Hydrocodone and Robaxin as needed  4. Mood: Zoloft 200 mg daily              -antipsychotic agents: N/A  5. Neuropsych: This patient is capable of making decisions on his own behalf.  6. Skin/Wound Care: Routine skin checks  7. Fluids/Electrolytes/Nutrition: Routine in and outs with follow-up chemistries  8.  Acute on chronic anemia.  Followed by Dr. [name]. continue iron supplement.  Follow-up CBC  9.  Undisplaced fracture lamina bilaterally of C6.  Conservative care.  Cervical collar  10.  Chronic diastolic congestive heart failure.  Followed by Dr. [name] monitor for any signs of fluid overload.  Patient currently euvolemic no edema home Demadex Lasix on hold resume in 1 to 2 days.  11.  History of atrial fibrillation.  Coumadin in the past discontinued 2014 due to spontaneous subdural hematoma with craniotomy.  Toprol 12.5 mg 12.5 mg  Daily. Cardiac rate controlled  12.  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  Follow-up chemistries  13.  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  No chest pain or shortness of breath  14.  Diet-controlled diabetes mellitus..  CBGs discontinued  15.  BPH.  Flomax 0.4 mg nightly,Myrbetriq 50 mg daily.  Check PVR  16.  Hyperlipidemia.  Crestor 40 mg daily  17.  OSA/pulmonary fibrosis.  CPAP.  Patient has been refusing at times.    Lavon Paganini Angiulli, PA-C  "I have personally performed a face to face diagnostic evaluation of this patient.  Additionally, I have reviewed and concur with the physician assistant's documentation above." Charlett Blake M.D. [facility] FAAPM&R (Neuromuscular Med) Diplomate Am Board of Electrodiagnostic Med Fellow Am Board of Interventional Pain

## 2020-02-10 ENCOUNTER — Inpatient Hospital Stay (HOSPITAL_COMMUNITY): Payer: Medicare Other

## 2020-02-10 ENCOUNTER — Inpatient Hospital Stay (HOSPITAL_COMMUNITY): Payer: Medicare Other | Admitting: Occupational Therapy

## 2020-02-10 DIAGNOSIS — M7989 Other specified soft tissue disorders: Secondary | ICD-10-CM

## 2020-02-10 LAB — COMPREHENSIVE METABOLIC PANEL
ALT: 10 U/L (ref 0–44)
AST: 25 U/L (ref 15–41)
Albumin: 3.1 g/dL — ABNORMAL LOW (ref 3.5–5.0)
Alkaline Phosphatase: 69 U/L (ref 38–126)
Anion gap: 9 (ref 5–15)
BUN: 33 mg/dL — ABNORMAL HIGH (ref 8–23)
CO2: 27 mmol/L (ref 22–32)
Calcium: 9 mg/dL (ref 8.9–10.3)
Chloride: 102 mmol/L (ref 98–111)
Creatinine, Ser: 1.23 mg/dL (ref 0.61–1.24)
GFR calc Af Amer: >60 mL/min (ref >60)
GFR calc non Af Amer: 56 mL/min — ABNORMAL LOW (ref >60)
Glucose, Bld: 93 mg/dL (ref 70–99)
Potassium: 4.8 mmol/L (ref 3.5–5.1)
Sodium: 138 mmol/L (ref 135–145)
Total Bilirubin: 1 mg/dL (ref 0.3–1.2)
Total Protein: 6.7 g/dL (ref 6.5–8.1)

## 2020-02-10 LAB — CBC WITH DIFFERENTIAL/PLATELET
Abs Immature Granulocytes: 0.01 10*3/uL (ref 0.00–0.07)
Basophils Absolute: 0 10*3/uL (ref 0.0–0.1)
Basophils Relative: 1 %
Eosinophils Absolute: 0.2 10*3/uL (ref 0.0–0.5)
Eosinophils Relative: 5 %
HCT: 28.9 % — ABNORMAL LOW (ref 39.0–52.0)
Hemoglobin: 9.4 g/dL — ABNORMAL LOW (ref 13.0–17.0)
Immature Granulocytes: 0 %
Lymphocytes Relative: 21 %
Lymphs Abs: 0.8 10*3/uL (ref 0.7–4.0)
MCH: 30.9 pg (ref 26.0–34.0)
MCHC: 32.5 g/dL (ref 30.0–36.0)
MCV: 95.1 fL (ref 80.0–100.0)
Monocytes Absolute: 0.2 10*3/uL (ref 0.1–1.0)
Monocytes Relative: 4 %
Neutro Abs: 2.5 10*3/uL (ref 1.7–7.7)
Neutrophils Relative %: 69 %
Platelets: 87 10*3/uL — ABNORMAL LOW (ref 150–400)
RBC: 3.04 MIL/uL — ABNORMAL LOW (ref 4.22–5.81)
RDW: 15.6 % — ABNORMAL HIGH (ref 11.5–15.5)
WBC: 3.6 10*3/uL — ABNORMAL LOW (ref 4.0–10.5)
nRBC: 0 % (ref 0.0–0.2)

## 2020-02-10 NOTE — Progress Notes (Signed)
Patient refused CPAP for the night. RT will monitor as needed 

## 2020-02-10 NOTE — Care Management (Signed)
[facility] Individual Statement of Services  Patient Name:  [name].  Date:  02/10/2020  Welcome to the [facility].  Our goal is to provide you with an individualized program based on your diagnosis and situation, designed to meet your specific needs.  With this comprehensive rehabilitation program, you will be expected to participate in at least 3 hours of rehabilitation therapies Monday-Friday, with modified therapy programming on the weekends.  Your rehabilitation program will include the following services:  Physical Therapy (PT), Occupational Therapy (OT), 24 hour per day rehabilitation nursing, Therapeutic Recreaction (TR), Neuropsychology, Case Management (Social Worker), Rehabilitation Medicine, Nutrition Services and Pharmacy Services  Weekly team conferences will be held on Wednesday to discuss your progress.  Your Social Industrial/product designer will talk with you frequently to get your input and to update you on team discussions.  Team conferences with you and your family in attendance may also be held.  Expected length of stay:  2 weeks  Overall anticipated outcome: Supervision - Minimal Guard assistance  Depending on your progress and recovery, your program may change. Your Social Industrial/product designer will coordinate services and will keep you informed of any changes. Your Social Worker's/Care Manager's name and contact numbers are listed  below.  The following services may also be recommended but are not provided by the [facility]:    [place] will be made to provide these services after discharge if needed.  Arrangements include referral to agencies that provide these services.  Your insurance has been verified to be:  Medicare/AARP Your primary doctor is:  [name] MD  Pertinent information will be shared with your doctor and  your insurance company.  Social Worker:  Loralee Pacas, [place] or ([phone] Care Manager: Dorien Chihuahua, RN [phone] or ([phone]  Information discussed with and copy given to patient by: Margarito Liner, 02/10/2020, 1:53 PM

## 2020-02-10 NOTE — Plan of Care (Signed)
  Problem: Consults Goal: RH GENERAL PATIENT EDUCATION Description: See Patient Education module for education specifics. Outcome: Progressing   Problem: RH BLADDER ELIMINATION Goal: RH STG MANAGE BLADDER WITH ASSISTANCE Description: STG Manage Bladder With Min Assistance Outcome: Progressing   Problem: RH SKIN INTEGRITY Goal: RH STG ABLE TO PERFORM INCISION/WOUND CARE W/ASSISTANCE Description: STG Able To Perform Incision/Wound Care With Assistance. Min Outcome: Progressing   Problem: RH SAFETY Goal: RH STG ADHERE TO SAFETY PRECAUTIONS W/ASSISTANCE/DEVICE Description: STG Adhere to Safety Precautions With Assistance/Device. MIn Outcome: Progressing   Problem: RH PAIN MANAGEMENT Goal: RH STG PAIN MANAGED AT OR BELOW PT'S PAIN GOAL Description: Less than 3 Outcome: Progressing

## 2020-02-10 NOTE — Progress Notes (Addendum)
[facility] MEDICINE [facility] REHABILITATION PROGRESS NOTE   Subjective/Complaints: RIght hip pain with movement only  Slept well overnite, loquacious ROS- denies, CP, SOB, N/V/D Objective:   No results found. Recent Labs    02/09/20 0432 02/10/20 0552  WBC 3.3* 3.6*  HGB 9.4* 9.4*  HCT 29.0* 28.9*  PLT 70* 87*   Recent Labs    02/09/20 0432 02/10/20 0552  NA 137 138  K 5.0 4.8  CL 103 102  CO2 26 27  GLUCOSE 107* 93  BUN 31* 33*  CREATININE 1.18 1.23  CALCIUM 8.9 9.0    Intake/Output Summary (Last 24 hours) at 02/10/2020 0817 Last data filed at 02/10/2020 0641 Gross per 24 hour  Intake 360 ml  Output 2350 ml  Net -1990 ml     Physical Exam: Vital Signs Blood pressure 108/61, pulse (!) 59, temperature 97.6 F (36.4 C), temperature source Oral, resp. rate 18, height 5\' 7"  (1.702 m), weight 83.4 kg, SpO2 99 %.  General: No acute distress Mood and affect are appropriate Heart: Regular rate and rhythm no rubs murmurs or extra sounds Lungs: Clear to auscultation, breathing unlabored, no rales or wheezes Abdomen: Positive bowel sounds, soft nontender to palpation, nondistended Extremities: No clubbing, cyanosis, or edema Skin: No evidence of breakdown, no evidence of rash Neurologic: Cranial nerves II through XII intact, motor strength is 5/5 in bilateral deltoid, bicep, tricep, grip,4/5 left, 3- Right  hip flexor, knee extensors, ankle dorsiflexor and plantar flexor Sensory exam normal sensation to light touch and proprioception in bilateral upper and lower extremities Cerebellar exam normal finger to nose to finger as well as heel to shin in bilateral upper and lower extremities Musculoskeletal:Reduce ROM Right hip , knee and ankle     Assessment/Plan: 1. Functional deficits secondary to Right IT hip fx  which require 3+ hours per day of interdisciplinary therapy in a comprehensive inpatient rehab setting.  Physiatrist is providing close team supervision  and 24 hour management of active medical problems listed below.  Physiatrist and rehab team continue to assess barriers to discharge/monitor patient progress toward functional and medical goals  Care Tool:  Bathing              Bathing assist       Upper Body Dressing/Undressing Upper body dressing        Upper body assist      Lower Body Dressing/Undressing Lower body dressing            Lower body assist       Toileting Toileting    Toileting assist Assist for toileting: Maximal Assistance - Patient 25 - 49%(condom cath)     Transfers Chair/bed transfer  Transfers assist           Locomotion Ambulation   Ambulation assist              Walk 10 feet activity   Assist           Walk 50 feet activity   Assist           Walk 150 feet activity   Assist           Walk 10 feet on uneven surface  activity   Assist           Wheelchair     Assist               Wheelchair 50 feet with 2 turns activity    Assist  Wheelchair 150 feet activity     Assist          Blood pressure 108/61, pulse (!) 59, temperature 97.6 F (36.4 C), temperature source Oral, resp. rate 18, height 5\' 7"  (1.702 m), weight 83.4 kg, SpO2 99 %.  Medical Problem List and Plan:  1.  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  Partial weightbearing 25% right lower extremity Rehab evals to day , team conf in am              -patient may  shower with wrapping over right foot and ankle              -ELOS/Goals: 14 to 16 days with supervision to min assist goals for ADLs and mobility  2.  Antithrombotics:  -DVT/anticoagulation: Lovenox.  Check vascular study              -antiplatelet therapy: N/A  3. Pain Management: Hydrocodone and Robaxin as needed  4.  Mood: Zoloft 200 mg daily              -antipsychotic agents: N/A  5. Neuropsych: This patient is capable of making decisions on his own behalf.  6. Skin/Wound Care: Routine skin checks  7. Fluids/Electrolytes/Nutrition: Routine in and outs with follow-up chemistries, elevated BUN, encourage fluids   8.  Acute on chronic anemia.  Followed by Dr. [name]. continue iron supplement.  Follow-up CBC  9.  Undisplaced fracture lamina bilaterally of C6.  Conservative care.  Cervical collar- pt states that Dr told him he did not have towear, will cont but try to clarify with surgery   10.  Chronic diastolic congestive heart failure.  Followed by Dr. [name] monitor for any signs of fluid overload.  Patient currently euvolemic no edema home Demadex Lasix on hold resume in 1 to 2 days.  11.  History of atrial fibrillation.  Coumadin in the past discontinued 2014 due to spontaneous subdural hematoma with craniotomy.  Toprol 12.5 mg 12.5 mg  Daily. Cardiac rate controlled Vitals:   02/09/20 1931 02/10/20 0610  BP: 105/63 108/61  Pulse: 60 (!) 59  Resp: 18 18  Temp: 98.2 F (36.8 C) 97.6 F (36.4 C)  SpO2: 98% 99%  Controlled 2/23  12.  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  Follow-up chemistries  13.  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  No chest pain or shortness of breath  14.  Diet-controlled diabetes mellitus..  CBGs discontinued  15.  BPH.  Flomax 0.4 mg nightly,Myrbetriq 50 mg daily.  Check PVR  16.  Hyperlipidemia.  Crestor 40 mg daily  17.  OSA/pulmonary fibrosis.  CPAP.  Patient has been refusing at times.       LOS: 1 days A FACE TO FACE EVALUATION WAS PERFORMED  Charlett Blake 02/10/2020, 8:17 AM

## 2020-02-10 NOTE — Progress Notes (Signed)
Patient information reviewed and entered into [facility] by Becky Roneisha Stern, PPS coordinator. Information including medical coding, function ability, and quality indicators will be reviewed and updated through discharge.   

## 2020-02-10 NOTE — Progress Notes (Signed)
Bilateral lower extremity venous extremity venous duplex. Refer to "CV Proc" under chart review to view preliminary results.  02/10/2020 4:41 PM Kelby Aline., MHA, RVT, RDCS, RDMS

## 2020-02-10 NOTE — Progress Notes (Signed)
Occupational Therapy Session Note  Patient Details  Name: [name]. MRN: [id number] Date of Birth: Jul 09, 1941  Today's Date: 02/10/2020 OT Individual Time: [id number] OT Individual Time Calculation (min): 57 min    Short Term Goals: Week 1:  OT Short Term Goal 1 (Week 1): Pt will complete LB bathing sit to stand with use of AE PRN and overall mod assist. OT Short Term Goal 2 (Week 1): Pt will perform LB dressing sit to stand with mod assist and AE PRN. OT Short Term Goal 3 (Week 1): Pt will complete stand pivot transfer to the bedside toilet with mod assist stand pivot for 2 consecutive attempts. OT Short Term Goal 4 (Week 1): Pt will complete toilet clothing management and toilet hygiene with mod assist sit to stand.  Skilled Therapeutic Interventions/Progress Updates:  Pt worked on sit to stand and stand pivot transfers during session.  He was able to complete functional transfers to and from the toilet with mod assist stand pivot.  He maintained TDWBing at 25% or less during the transfer as well as with sit to stand.  He was able to transfer to the bed from the wheelchair at the same level but needed max assist for removal of shoes.  Mod assist for lifting his LEs into the bed when laying down, with increased pain noted during transition.  Finished session with call button and phone in reach and safety alarm on the bed.     Therapy Documentation Precautions:  Precautions Precautions: Fall, Other (comment) Precaution Comments: Partial WB 25% RLE Required Braces or Orthoses: Cervical Brace Cervical Brace: Hard collar, For comfort Restrictions Weight Bearing Restrictions: Yes RLE Weight Bearing: Partial weight bearing RLE Partial Weight Bearing Percentage or Pounds: 25%  Pain: Pain Assessment Pain Scale: Faces Faces Pain Scale: Hurts little more Pain Type: Surgical pain Pain Location: Hip Pain Orientation: Right Pain Descriptors / Indicators: Discomfort Pain Onset: With  Activity Pain Intervention(s): Repositioned Multiple Pain Sites: No ADL: See Care Tool Section for some details of mobility and selfcare  Therapy/Group: Individual Therapy  Jacqueline Spofford OTR/L 02/10/2020, 4:41 PM

## 2020-02-10 NOTE — Evaluation (Signed)
Physical Therapy Assessment and Plan  Patient Details  Name: [name]. MRN: [id number] Date of Birth: Feb 24, 1941  PT Diagnosis: Abnormal posture, Abnormality of gait, Difficulty walking, Edema, Impaired sensation, Muscle weakness and Pain in RLE Rehab Potential: Good ELOS: 14 days   Today's Date: 02/10/2020 PT Individual Time: 0802-0920 PT Individual Time Calculation (min): 78 min    Problem List:  Patient Active Problem List   Diagnosis Date Noted  . Hip fracture ([place]) 02/09/2020  . Osteomyelitis of second toe of right foot ([place])   . Closed right hip fracture, initial encounter ([place]) 02/04/2020  . Closed intertrochanteric fracture of right hip, initial encounter ([place]) 02/04/2020  . Closed nondisplaced intertrochanteric fracture of right femur ([place])   . Symptomatic bradycardia 09/24/2019  . Chronic diastolic heart failure ([place]) 06/03/2019  . Coronary artery disease involving native heart without angina pectoris 06/03/2019  . Debility 05/26/2019  . 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 Toe infection 05/17/2019  . DOE (dyspnea on exertion) 05/15/2019  . Severe tricuspid regurgitation 05/15/2019  . Chronic right-sided CHF (congestive heart failure) ([place]) 05/15/2019  . Cellulitis of fourth toe of left foot   . Excessive daytime sleepiness 02/11/2019  . 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[place]) 02/11/2019  . Iron deficiency anemia 02/04/2019  . Poor memory 12/16/2018  . Deficiency anemia 11/19/2018  . Other fatigue 11/19/2018  . History of elevated PSA 11/19/2018  . Prostate cancer screening 11/19/2018  . Prostate CA ([place]) 11/19/2018  . Pancytopenia, acquired ([place]) 08/13/2018  . Recurrent falls 08/13/2018  . 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 Peripheral neuropathy 07/31/2018  . 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 Anemia, chronic disease 01/28/2018  . History of skin cancer 07/27/2017  .  Fatty liver disease, nonalcoholic 62/94/7654  . Thrombocytopenia ([place]) 11/27/2016  . Lung nodule 04/23/2014  . Postinflammatory pulmonary fibrosis ([place]) 06/13/2013  . Long term (current) use of anticoagulants 11/22/2011  . Abdominal bruit 03/07/2011  . Pacemaker-St.Jude   . Hx of CABG   . Hyperlipidemia type II   . OSA (obstructive sleep apnea)   . PACEMAKER, PERMANENT 08/17/2010  . HYPERLIPIDEMIA 08/16/2010  . Obesity 08/16/2010  . Essential hypertension 08/16/2010  . Atrial fibrillation ([place]) 08/16/2010  . IRREGULAR HEART RATE 08/16/2010  . Sleep apnea 08/16/2010  . BENIGN PROSTATIC HYPERTROPHY, HX OF 08/16/2010    Past Medical History:  Past Medical History:  Diagnosis Date  . (HFpEF) heart failure with preserved ejection fraction ([place])    a. 05/2013 Echo: EF 55%, mild LVH, diast dysfxn, Ao sclerosis, mildly dil LA, RV dysfxn (poorly visualized), PASP 35mHg;  b. 03/2017 Echo: EF 55-60%, no rwma, triv MR, mildly dil RV, mod TR, PASP 472mg.  . [facility]   s/p Cox Maze 1/09;  Multaq Rx d/c'd in 2014 due to pulmo fibrosis;  coumadin d/c'd in 2014 due to spontaneous subdural hematoma  . BPH (benign prostatic hyperplasia)   . CAD (coronary artery disease), native coronary artery    a. s/p CABG 12/2007;  b. Myoview 12/2011: EF 66%, no scar or ischemia; normal.  . DM (diabetes mellitus) ([place]  . Hyperlipidemia type II   . Hypertension   . 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 OSA (obstructive sleep apnea)   . Pacemaker    PPM - St. Jude  . Peripheral neuropathy 07/31/2018  . Pulmonary fibrosis ([place]   Multaq d/c'd 7/14  .  Subdural hematoma ([place]) 07/2012   spontaneous;  coumadin d/c'd => no longer a candidate for anticoagulation   Past Surgical History:  Past Surgical History:  Procedure Laterality Date  . AMPUTATION Left 05/17/2019   Procedure: AMPUTATION LEFT FOURTH TOE;  Surgeon: Meredith Pel, MD;  Location: [facility];   Service: Orthopedics;  Laterality: Left;  . AMPUTATION TOE Right 02/06/2020   Procedure: AMPUTATION TOE;  Surgeon: Newt Minion, MD;  Location: [facility];  Service: Orthopedics;  Laterality: Right;  . APPENDECTOMY    . CHOLECYSTECTOMY    . CORONARY ARTERY BYPASS GRAFT     x3 (left internal mammary artery to distal left anterior descending coronary artery, saphenous vain graft to second circumflex marginal branch, saphenous vain graft to posterior descending coronary artery, endoscopic saphenous vain harvest from right thigh) and modified Cox - Maze IV procedure.  [name],MD. Electronically signed CHO/MEDQ D: 01/09/2008/ JOB: [id number] cc:  [name]  . [name]  07/30/2012   Procedure: CRANIOTOMY HEMATOMA EVACUATION SUBDURAL;  Surgeon: [name], MD;  Location: [facility] [place] NEURO ORS;  Service: Neurosurgery;  Laterality: Right;  Right craniotomy for evacuation of subdural hematoma  . FOOT SURGERY    . HERNIA REPAIR    . INTRAMEDULLARY (IM) NAIL INTERTROCHANTERIC Right 02/04/2020   Procedure: INTRAMEDULLARY (IM) NAIL INTERTROCHANTRIC;  Surgeon: Netta Cedars, MD;  Location: [facility];  Service: Orthopedics;  Laterality: Right;  . ORCHIECTOMY     Left  /  testicular cancer  . PACEMAKER PLACEMENT     PPM - St. Jude  . PPM GENERATOR CHANGEOUT N/A 06/25/2019   Procedure: PPM GENERATOR CHANGEOUT;  Surgeon: Evans Lance, MD;  Location: [place] CV LAB;  Service: Cardiovascular;  Laterality: N/A;    Assessment & Plan Clinical Impression: Patient is a 79 y.o. 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  Patient received inpatient rehab  service in June 2020 for debilitation related to CHF.  Per chart review patient lives with spouse.  Independent with assistive device.  1 level home 3 steps to entry.  Presented 02/04/2020 after ground-level fall without loss of consciousness sustaining a right intertrochanteric femur fracture as well as undisplaced fracture involving the lamina bilaterally at C6.[name]  Underwent closed intramedullary nailing of right femur fracture 02/04/2020 per Dr. 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 [name].  Patient is partial weightbearing 25% right lower extremity.  Patient was placed conservatively in a cervical collar for undisplaced fracture lamina of C6.  Hospital course acute blood loss anemia 9.2.  Blood pressure monitored bouts of orthostasis chronic diastolic congestive heart failure followed by cardiology services patient felt to be euvolemic home Demadex and Lasix were held.  He did receive a 500 cc bolus x1 on 2/18 and x2 on 2/19 and again repeated 2/21 maintained on Lovenox for DVT prophylaxis.  Therapy evaluations completed patient was admitted for a comprehensive rehab program.  Patient transferred to CIR on 02/09/2020 .   Patient currently requires max with mobility secondary to muscle weakness and muscle joint tightness, decreased proprioception, decreased safety awareness and decreased standing balance, decreased balance strategies and difficulty maintaining precautions.  Prior to hospitalization, patient  was modified independent  with mobility and lived with Spouse in a House.  Home access is 3Stairs to enter.  Patient will benefit from skilled PT intervention to maximize safe functional mobility, minimize fall risk and decrease caregiver burden for planned discharge home with 24 hour supervision.  Anticipate patient will benefit from follow up [place] at discharge.  PT - End of Session Activity Tolerance: Tolerates 30+ min activity with multiple  rests Endurance Deficit: Yes Endurance Deficit Description: Increased fatigue noted with standing PT Assessment Rehab Potential (ACUTE/IP ONLY): Good PT Barriers to Discharge: Home environment access/layout;Other (comments) PT Barriers to Discharge Comments: 3 STE with B rails, PWB RLE restrictions PT Patient demonstrates impairments in the following area(s): Balance;Endurance;Pain;Safety;Motor PT Transfers Functional Problem(s): Bed Mobility;Bed to Chair;Car;Furniture PT Locomotion Functional Problem(s): Ambulation;Stairs PT Plan PT Intensity: Minimum of 1-2 x/day ,45 to 90 minutes PT Frequency: 5 out of 7 days PT Duration Estimated Length of Stay: 14 days PT Treatment/Interventions: Ambulation/gait training;DME/adaptive equipment instruction;Neuromuscular re-education;Pain management;Patient/family education;Stair training;Therapeutic Exercise;Therapeutic Activities;Splinting/orthotics;UE/LE Strength taining/ROM;Wheelchair propulsion/positioning;Functional mobility training PT Transfers Anticipated Outcome(s): CGA PT Locomotion Anticipated Outcome(s): Supervision PT Recommendation Follow Up Recommendations: Home health PT;Outpatient PT Patient destination: Home Equipment Recommended: To be determined  Skilled Therapeutic Intervention Evaluation completed (see details above and below) with education on PT POC and goals and individual treatment initiated with focus on transfers, w/c mobility, standing balance and gait with PWB 25% on RLE.   Pt supine in bed upon PT arrival, agreeable to therapy tx, denies pain while laying in bed throughout session c/o inc in pain to 10/10 with any RLE movement. Pt provided with rest breaks throughout session, asked if he wanted to continue in which he replied "yes, let's try", and ice applied to R hip. Therapist doffed condom cath d/t pt reported that it was uncomfortable and falling off. Pt performed rolling in bed L<>R with modA, limited by pain, to allow  therapist to donn brief totalA. Supine > sit with minA for LE management and modA to come to trunk upright. Pt required modA for hip scooting forward at EOB 2/2 pain. Sitting EOB pt demonstrated good balance with supervision. Therapist donned aspen cervical collar maxA. Therapist assessed global LE sensation and strength, see below.  Therapist donned pants totalA to elevated RLE to allow for LE threading, pant helped pull pants up to thigh in sitting. Sit > stand at [facility] with maxA for upright posture, stability, positioning of RLE and cueing for sequencing, pt performed standing balance 30-60s with BUE support on RW to allow for therapist to fully donn pants totalA.  Stand > sit on EOB with minA cueing for sequencing, pt donned shift with supervision. Sit > standing at RW with maxA > stand pivot transfer L to w/c with min-modA for RW management and RLE PWB positioning. Pt transported to ortho gym in w/c. Pt performed stand pivot transfer from w/c > car with maxA for trunk upright and minA for turning and RW management, pt required additional time and minA for LE management into/out of car 2/2 pain. Sit > stand with maxA from car to RW, once standing pt ambulated 10' with PWB on RLE and minA for RW management and cues for sequencing. Stand > sit with maxA for stability, positioning and with cues for sequencing. Pt performed w/c mobility with supervision 150' with multiple turns and then transported back to room for time management  in w/c, left in w/c with needs in reach and chair alarm set.    PT  Evaluation Precautions/Restrictions Precautions Precautions: Fall;Other (comment) Precaution Comments: Partial WB 25% RLE Restrictions Weight Bearing Restrictions: Yes RLE Weight Bearing: Partial weight bearing RLE Partial Weight Bearing Percentage or Pounds: 25% Home Living/Prior Functioning Home Living Available Help at Discharge: Family;Available 24 hours/day Type of Home: House Home Access: Stairs to  enter [employer] of Steps: 3 Entrance Stairs-Rails: Right;Left;Can reach both Home Layout: One level Bathroom Shower/Tub: Walk-in shower;Door [employer] Toilet: Standard Bathroom Accessibility: Yes  Lives With: Spouse Prior Function Level of Independence: Requires assistive device for independence;Needs assistance with ADLs Dressing: Maximal  Able to Take Stairs?: Yes Driving: No Comments: has SPC at home intermittent use, typically uses rollator per therapy rec, lots of falls, spends a lot of time with grandson, likes golf Cognition Overall Cognitive Status: Within Functional Limits for tasks assessed Arousal/Alertness: Awake/alert Attention: Sustained Sustained Attention: Appears intact Memory: Appears intact Awareness: Appears intact Sensation Sensation Light Touch: Impaired by gross assessment Proprioception: Impaired by gross assessment Additional Comments: sensation intact in BLE, RLE dec distally to light touch compared to LLE Coordination Gross Motor Movements are Fluid and Coordinated: Yes(LLE WFL for functional tasks, RLE PWB 25% unable to asssess formally WFL throughout functional mobility) Fine Motor Movements are Fluid and Coordinated: Yes Motor  Motor Motor: Within Functional Limits Motor - Skilled Clinical Observations: generalized weakness with limitations in mobility secondary to increased pain in the right hip.  Mobility Bed Mobility Bed Mobility: Rolling Right;Rolling Left;Supine to Sit Rolling Right: Moderate Assistance - Patient 50-74% Rolling Left: Moderate Assistance - Patient 50-74% Supine to Sit: Moderate Assistance - Patient 50-74% Transfers Transfers: Sit to Stand;Stand to Sit;Stand Pivot Transfers Sit to Stand: Maximal Assistance - Patient 25-49% Stand to Sit: Maximal Assistance - Patient 25-49% Stand Pivot Transfers: Moderate Assistance - Patient 50 - 74% Stand Pivot Transfer Details: Verbal cues for technique;Visual cues/gestures for  sequencing;Verbal cues for sequencing;Verbal cues for precautions/safety;Verbal cues for safe use of DME/AE Transfer (Assistive device): Rolling walker Locomotion  Gait Ambulation: Yes Gait Assistance: Minimal Assistance - Patient > 75% Gait Distance (Feet): 10 Feet Assistive device: Rolling walker Gait Assistance Details: Verbal cues for technique;Verbal cues for gait pattern;Verbal cues for precautions/safety;Verbal cues for sequencing;Verbal cues for safe use of DME/AE Gait Gait: Yes Gait Pattern: Impaired Gait Pattern: Step-to pattern;Decreased step length - left;Decreased step length - right;Decreased stance time - right;Decreased stance time - left;Decreased stride length;Decreased hip/knee flexion - left;Antalgic;Trunk flexed Architect: Yes Wheelchair Assistance: Chartered loss adjuster: Both upper extremities Wheelchair Parts Management: Supervision/cueing Distance: 150'  Trunk/Postural Assessment  Cervical Assessment Cervical Assessment: Exceptions to WFL(cervical protraction with aspen collar in place) Thoracic Assessment Thoracic Assessment: Exceptions to WFL(rounded shoulders, kyphotic) Lumbar Assessment Lumbar Assessment: Exceptions to WFL(posterior pelvic tilt) Postural Control Postural Control: Within Functional Limits  Balance Balance Balance Assessed: Yes Static Sitting Balance Static Sitting - Balance Support: Bilateral upper extremity supported Static Sitting - Level of Assistance: 5: Stand by assistance Dynamic Sitting Balance Dynamic Sitting - Balance Support: During functional activity Dynamic Sitting - Level of Assistance: 4: Min assist Sitting balance - Comments: pt able to sit EOB without assist Static Standing Balance Static Standing - Balance Support: During functional activity Static Standing - Level of Assistance: 4: Min assist Dynamic Standing Balance Dynamic Standing - Balance Support:  During functional activity Dynamic Standing - Level of Assistance: 3: Mod assist Extremity Assessment  RLE Assessment RLE Assessment: Exceptions to [facility] Passive Range of Motion (PROM) Comments: tolerated 90* hip flexion in sitting, ~ 5-10*  DF Active Range of Motion (AROM) Comments: in sitting unable to flex hip actively however had activation of mm., dec knee extension and ankle DF limited 2/2 pain General Strength Comments: RLE globally weak 2/2 pain, unable to move thorugh full ROM against gravity LLE Assessment LLE Assessment: Exceptions to [facility] Passive Range of Motion (PROM) Comments: tolerated 90* hip flexion in sitting, ~ 5-10* DF Active Range of Motion (AROM) Comments: WFL hip flexion (with pain in RLE w/mm. activation), knee extension and ankle DF General Strength Comments: globally weak 4-/5 LLE 2/2 pain    Refer to Care Plan for Long Term Goals  Recommendations for other services: None   Discharge Criteria: Patient will be discharged from PT if patient refuses treatment 3 consecutive times without medical reason, if treatment goals not met, if there is a change in medical status, if patient makes no progress towards goals or if patient is discharged from hospital.  The above assessment, treatment plan, treatment alternatives and goals were discussed and mutually agreed upon: by patient  Juliann Pulse SPT 02/10/2020, 7:36 AM

## 2020-02-10 NOTE — Care Management (Signed)
Patient Details  Name: [name]. MRN: [id number] Date of Birth: 1941/03/04  Today's Date: 02/10/2020  Problem List:  Patient Active Problem List   Diagnosis Date Noted  . Hip fracture ([place]) 02/09/2020  . Osteomyelitis of second toe of right foot ([place])   . Closed right hip fracture, initial encounter ([place]) 02/04/2020  . Closed intertrochanteric fracture of right hip, initial encounter ([place]) 02/04/2020  . Closed nondisplaced intertrochanteric fracture of right femur ([place])   . Symptomatic bradycardia 09/24/2019  . Chronic diastolic heart failure ([place]) 06/03/2019  . Coronary artery disease involving native heart without angina pectoris 06/03/2019  . Debility 05/26/2019  . 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 Toe infection 05/17/2019  . DOE (dyspnea on exertion) 05/15/2019  . Severe tricuspid regurgitation 05/15/2019  . Chronic right-sided CHF (congestive heart failure) ([place]) 05/15/2019  . Cellulitis of fourth toe of left foot   . Excessive daytime sleepiness 02/11/2019  . 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[place]) 02/11/2019  . Iron deficiency anemia 02/04/2019  . Poor memory 12/16/2018  . Deficiency anemia 11/19/2018  . Other fatigue 11/19/2018  . History of elevated PSA 11/19/2018  . Prostate cancer screening 11/19/2018  . Prostate CA ([place]) 11/19/2018  . Pancytopenia, acquired ([place]) 08/13/2018  . Recurrent falls 08/13/2018  . 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 Peripheral neuropathy 07/31/2018  . 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 Anemia, chronic disease 01/28/2018  . History of skin cancer 07/27/2017  . Fatty liver disease, nonalcoholic [id number]  . Thrombocytopenia ([place]) 11/27/2016  . Lung nodule 04/23/2014  . Postinflammatory pulmonary fibrosis ([place]) 06/13/2013  . Long term (current) use of anticoagulants 11/22/2011  . Abdominal bruit 03/07/2011  . Pacemaker-St.Jude   . Hx of  CABG   . Hyperlipidemia type II   . OSA (obstructive sleep apnea)   . PACEMAKER, PERMANENT 08/17/2010  . HYPERLIPIDEMIA 08/16/2010  . Obesity 08/16/2010  . Essential hypertension 08/16/2010  . Atrial fibrillation ([place]) 08/16/2010  . IRREGULAR HEART RATE 08/16/2010  . Sleep apnea 08/16/2010  . BENIGN PROSTATIC HYPERTROPHY, HX OF 08/16/2010   Past Medical History:  Past Medical History:  Diagnosis Date  . (HFpEF) heart failure with preserved ejection fraction ([place])    a. 05/2013 Echo: EF 55%, mild LVH, diast dysfxn, Ao sclerosis, mildly dil LA, RV dysfxn (poorly visualized), PASP 33mmHg;  b. 03/2017 Echo: EF 55-60%, no rwma, triv MR, mildly dil RV, mod TR, PASP 1mmHg.  . Atrial fibrillation [facility])    s/p Cox Maze 1/09;  Multaq Rx d/c'd in 2014 due to pulmo fibrosis;  coumadin d/c'd in 2014 due to spontaneous subdural hematoma  . BPH (benign prostatic hyperplasia)   . CAD (coronary artery disease), native coronary artery    a. s/p CABG 12/2007;  b. Myoview 12/2011: EF 66%, no scar or ischemia; normal.  . DM (diabetes mellitus) ([place])   . Hyperlipidemia type II   . Hypertension   . 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 OSA (obstructive sleep apnea)   . Pacemaker    PPM - St. Jude  . Peripheral neuropathy 07/31/2018  . Pulmonary fibrosis ([place])    Multaq d/c'd 7/14  . Subdural hematoma ([place]) 07/2012   spontaneous;  coumadin d/c'd => no longer a candidate for anticoagulation   Past Surgical History:  Past Surgical History:  Procedure Laterality Date  . AMPUTATION Left 05/17/2019   Procedure: AMPUTATION LEFT FOURTH TOE;  Surgeon:  Meredith Pel, MD;  Location: [place];  Service: Orthopedics;  Laterality: Left;  . AMPUTATION TOE Right 02/06/2020   Procedure: AMPUTATION TOE;  Surgeon: Newt Minion, MD;  Location: [place];  Service: Orthopedics;  Laterality: Right;  . APPENDECTOMY    . CHOLECYSTECTOMY    . CORONARY ARTERY BYPASS GRAFT     x3 (left  internal mammary artery to distal left anterior descending coronary artery, saphenous vain graft to second circumflex marginal branch, saphenous vain graft to posterior descending coronary artery, endoscopic saphenous vain harvest from right thigh) and modified Cox - Maze IV procedure.  [name],MD. Electronically signed CHO/MEDQ D: 01/09/2008/ JOB: [id number] cc:  [name]  . [name]  07/30/2012   Procedure: CRANIOTOMY HEMATOMA EVACUATION SUBDURAL;  Surgeon: [name], MD;  Location: [place] NEURO ORS;  Service: Neurosurgery;  Laterality: Right;  Right craniotomy for evacuation of subdural hematoma  . FOOT SURGERY    . HERNIA REPAIR    . INTRAMEDULLARY (IM) NAIL INTERTROCHANTERIC Right 02/04/2020   Procedure: INTRAMEDULLARY (IM) NAIL INTERTROCHANTRIC;  Surgeon: Netta Cedars, MD;  Location: [place];  Service: Orthopedics;  Laterality: Right;  . ORCHIECTOMY     Left  /  testicular cancer  . PACEMAKER PLACEMENT     PPM - St. Jude  . PPM GENERATOR CHANGEOUT N/A 06/25/2019   Procedure: PPM GENERATOR CHANGEOUT;  Surgeon: Evans Lance, MD;  Location: [place] CV LAB;  Service: Cardiovascular;  Laterality: N/A;   Social History:  reports that he quit smoking about 28 years ago. His smoking use included cigarettes. He has a 40.00 pack-year smoking history. He has never used smokeless tobacco. He reports that he does not drink alcohol or use drugs.  Family / Support Systems Marital Status: Married Patient Roles: Spouse, Parent Anticipated Caregiver: wife Ability/Limitations of Caregiver: superivison to min assist level Caregiver Availability: 24/7  Social History Preferred language: English Religion: Baptist Read: Yes Write: Yes Employment Status: Retired Date Retired/Disabled/Unemployed: Agricultural consultant   Abuse/Neglect Abuse/Neglect Assessment Can Be Completed: Yes Physical Abuse: Denies Verbal Abuse: Denies Sexual Abuse: Denies Exploitation of patient/patient's resources:  Denies Self-Neglect: Denies  Emotional Status Pt's affect, behavior and adjustment status: Normal mood, affect and behavior Psychiatric History: PTSD, Depression  Patient / Family Perceptions, Expectations & Goals Pt/Family understanding of illness & functional limitations: Patient appears to have a good understanding of current health status and functional limitations Premorbid pt/family roles/activities: Independent PTA, remodeling the home, getting up and down to paint, etc Anticipated changes in roles/activities/participation: Will need min guard/supevision at discharge Pt/family expectations/goals: The patient would like to be independent with self care, dressing, toileting, and transfers  [place] Care/DME Agencies: ([place] used in the past) Transportation available at discharge: Wife can provide transportation at discharge Resource referrals recommended: Neuropsychology  Discharge Planning Living Arrangements: Spouse/significant other Support Systems: Spouse/significant other Type of Residence: Private residence Insurance Resources: Medicare Money Management: Patient Does the patient have any problems obtaining your medications?: No Home Management: Patient completed the cooking PTA and assisted wife with home management. Patient/Family Preliminary Plans: Plan to discharge home with wife who can assist as needed [place] Barriers to Discharge: Decreased caregiver support, Inaccessible home environment, Other (comments), Weight, Weight bearing restrictions [place] Barriers to Discharge Comments: 3 step entry to home with rails/grab bars Social Work Anticipated Follow Up Needs: [place] Additional Notes/Comments: Has shower/tub access and 3n1 from previous hospitalization Expected length of stay: ELOS 2 weeks  Clinical Impression The patient reports he had fallen at home PTA however this time he was unable to move his right leg and could not  get up with his wife and neighbor's help, so they called 911. He reports "just his luck" regarding coming to the hospital for one thing and having something removed when discussing amputation of second right toe after admission for hip surgery. Appears in good spirits overall and willing to do what he needs to do to get back to his pre-admission functional status.  Dorien Chihuahua B 02/10/2020, 2:44 PM

## 2020-02-10 NOTE — Evaluation (Signed)
Occupational Therapy Assessment and Plan  Patient Details  Name: [name]. MRN: [id number] Date of Birth: 1941-03-03  OT Diagnosis: acute pain and muscle weakness (generalized) Rehab Potential: Rehab Potential (ACUTE ONLY): Excellent ELOS: 12-14 days   Today's Date: 02/10/2020 OT Individual Time: 1005-1104 OT Individual Time Calculation (min): 59 min     Problem List:  Patient Active Problem List   Diagnosis Date Noted  . Hip fracture ([place]) 02/09/2020  . Osteomyelitis of second toe of right foot ([place])   . Closed right hip fracture, initial encounter ([place]) 02/04/2020  . Closed intertrochanteric fracture of right hip, initial encounter ([place]) 02/04/2020  . Closed nondisplaced intertrochanteric fracture of right femur ([place])   . Symptomatic bradycardia 09/24/2019  . Chronic diastolic heart failure ([place]) 06/03/2019  . Coronary artery disease involving native heart without angina pectoris 06/03/2019  . Debility 05/26/2019  . 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 Toe infection 05/17/2019  . DOE (dyspnea on exertion) 05/15/2019  . Severe tricuspid regurgitation 05/15/2019  . Chronic right-sided CHF (congestive heart failure) ([place]) 05/15/2019  . Cellulitis of fourth toe of left foot   . Excessive daytime sleepiness 02/11/2019  . 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[place]) 02/11/2019  . Iron deficiency anemia 02/04/2019  . Poor memory 12/16/2018  . Deficiency anemia 11/19/2018  . Other fatigue 11/19/2018  . History of elevated PSA 11/19/2018  . Prostate cancer screening 11/19/2018  . Prostate CA ([place]) 11/19/2018  . Pancytopenia, acquired ([place]) 08/13/2018  . Recurrent falls 08/13/2018  . 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 Peripheral neuropathy 07/31/2018  . 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 Anemia, chronic disease 01/28/2018  . History of skin cancer 07/27/2017  . Fatty liver disease,  nonalcoholic 10/03/5101  . Thrombocytopenia ([place]) 11/27/2016  . Lung nodule 04/23/2014  . Postinflammatory pulmonary fibrosis ([place]) 06/13/2013  . Long term (current) use of anticoagulants 11/22/2011  . Abdominal bruit 03/07/2011  . Pacemaker-St.Jude   . Hx of CABG   . Hyperlipidemia type II   . OSA (obstructive sleep apnea)   . PACEMAKER, PERMANENT 08/17/2010  . HYPERLIPIDEMIA 08/16/2010  . Obesity 08/16/2010  . Essential hypertension 08/16/2010  . Atrial fibrillation ([place]) 08/16/2010  . IRREGULAR HEART RATE 08/16/2010  . Sleep apnea 08/16/2010  . BENIGN PROSTATIC HYPERTROPHY, HX OF 08/16/2010    Past Medical History:  Past Medical History:  Diagnosis Date  . (HFpEF) heart failure with preserved ejection fraction ([place])    a. 05/2013 Echo: EF 55%, mild LVH, diast dysfxn, Ao sclerosis, mildly dil LA, RV dysfxn (poorly visualized), PASP 46mHg;  b. 03/2017 Echo: EF 55-60%, no rwma, triv MR, mildly dil RV, mod TR, PASP 473mg.  . [facility]   s/p Cox Maze 1/09;  Multaq Rx d/c'd in 2014 due to pulmo fibrosis;  coumadin d/c'd in 2014 due to spontaneous subdural hematoma  . BPH (benign prostatic hyperplasia)   . CAD (coronary artery disease), native coronary artery    a. s/p CABG 12/2007;  b. Myoview 12/2011: EF 66%, no scar or ischemia; normal.  . DM (diabetes mellitus) ([place]  . Hyperlipidemia type II   . Hypertension   . 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 OSA (obstructive sleep apnea)   . Pacemaker    PPM - St. Jude  . Peripheral neuropathy 07/31/2018  . Pulmonary fibrosis ([place]   Multaq d/c'd 7/14  . Subdural hematoma ([place]08/2013  spontaneous;  coumadin d/c'd => no longer a candidate for anticoagulation   Past Surgical History:  Past Surgical History:  Procedure Laterality Date  . AMPUTATION Left 05/17/2019   Procedure: AMPUTATION LEFT FOURTH TOE;  Surgeon: Meredith Pel, MD;  Location: [place];  Service: Orthopedics;   Laterality: Left;  . AMPUTATION TOE Right 02/06/2020   Procedure: AMPUTATION TOE;  Surgeon: Newt Minion, MD;  Location: [place];  Service: Orthopedics;  Laterality: Right;  . APPENDECTOMY    . CHOLECYSTECTOMY    . CORONARY ARTERY BYPASS GRAFT     x3 (left internal mammary artery to distal left anterior descending coronary artery, saphenous vain graft to second circumflex marginal branch, saphenous vain graft to posterior descending coronary artery, endoscopic saphenous vain harvest from right thigh) and modified Cox - Maze IV procedure.  [name],MD. Electronically signed CHO/MEDQ D: 01/09/2008/ JOB: [id number] cc:  [name]  . [name]  07/30/2012   Procedure: CRANIOTOMY HEMATOMA EVACUATION SUBDURAL;  Surgeon: [name], MD;  Location: [place] NEURO ORS;  Service: Neurosurgery;  Laterality: Right;  Right craniotomy for evacuation of subdural hematoma  . FOOT SURGERY    . HERNIA REPAIR    . INTRAMEDULLARY (IM) NAIL INTERTROCHANTERIC Right 02/04/2020   Procedure: INTRAMEDULLARY (IM) NAIL INTERTROCHANTRIC;  Surgeon: Netta Cedars, MD;  Location: [place];  Service: Orthopedics;  Laterality: Right;  . ORCHIECTOMY     Left  /  testicular cancer  . PACEMAKER PLACEMENT     PPM - St. Jude  . PPM GENERATOR CHANGEOUT N/A 06/25/2019   Procedure: PPM GENERATOR CHANGEOUT;  Surgeon: Evans Lance, MD;  Location: [place] CV LAB;  Service: Cardiovascular;  Laterality: N/A;    Assessment & Plan Clinical Impression: Patient is a 79 y.o. year old male with recent admission to the hospital on 02/04/2020 after ground-level fall without loss of consciousness sustaining a right intertrochanteric femur fracture as well as undisplaced fracture involving the lamina bilaterally at C6.[name]  Underwent closed intramedullary nailing of right femur fracture 02/04/2020 per Dr. 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 [name].  Patient is partial weightbearing 25% right  lower extremity.  Patient was placed conservatively in a cervical collar for undisplaced fracture lamina of C6.  Patient transferred to CIR on 02/09/2020 .    Patient currently requires max with basic self-care skills secondary to muscle weakness and decreased standing balance and decreased balance strategies.  Prior to hospitalization, patient could complete ADLs with min.  Patient will benefit from skilled intervention to decrease level of assist with basic self-care skills and increase independence with basic self-care skills prior to discharge home with care partner.  Anticipate patient will require minimal physical assistance and follow up home health.  OT - End of Session Activity Tolerance: Improving Endurance Deficit: Yes Endurance Deficit Description: Increased fatigue noted with standing OT Assessment Rehab Potential (ACUTE ONLY): Excellent OT Patient demonstrates impairments in the following area(s): Balance;Pain OT Basic ADL's Functional Problem(s): Grooming;Bathing;Dressing;Toileting OT Transfers Functional Problem(s): Toilet;Tub/Shower OT Additional Impairment(s): None OT Plan OT Intensity: Minimum of 1-2 x/day, 45 to 90 minutes OT Frequency: 5 out of 7 days OT Duration/Estimated Length of Stay: 12-14 days OT Treatment/Interventions: Balance/vestibular training;Discharge planning;Pain management;Self Care/advanced ADL retraining;Therapeutic Activities;Cognitive remediation/compensation;Functional mobility training;Patient/family education;Therapeutic Exercise;Community reintegration;DME/adaptive equipment instruction;Neuromuscular re-education;UE/LE Strength taining/ROM;Wheelchair propulsion/positioning OT Self Feeding Anticipated Outcome(s): independent OT Basic Self-Care Anticipated Outcome(s): min guard to min assist OT Toileting Anticipated Outcome(s): min guard  OT Bathroom Transfers Anticipated Outcome(s): min guard OT Recommendation Patient destination: Home Follow Up  Recommendations: 24 hour supervision/assistance;Home health OT Equipment Recommended: To be determined   Skilled Therapeutic Intervention Pt in wheelchair to start session.  He was agreeable to participate in selfcare session sit to stand at the sink.  Supervision for UB bathing and dressing, with removal of the cervical collar for washing his neck and face by therapist.  It was then placed back after completing UB dressing with setup assistance.  Decreased ability to complete washing lower legs secondary to increased right hip pain.  Therapist assisted with washing his left foot and donning gripper sock.  Right foot was wrapped with ace bandage, so therapist did not attempt to remove.  He needed max assist for sit to stand with mod assist for standing balance while pushing down his brief and pants, cleaning his peri area, and then pulling the garments back up.  He was able to maintain TDWBing over the RLE with this task.  Finished session with completion of donning his bilateral hearing aides and combing his hair.  Pt left up in the wheelchair per his request with call button and phone in reach and safety alarm belt in place.    OT Evaluation Precautions/Restrictions  Precautions Precautions: Fall;Other (comment) Precaution Comments: Partial WB 25% RLE Required Braces or Orthoses: Cervical Brace Cervical Brace: Hard collar;For comfort Restrictions Weight Bearing Restrictions: Yes RLE Weight Bearing: Partial weight bearing RLE Partial Weight Bearing Percentage or Pounds: 25%  Pain Pain Assessment Pain Scale: 0-10 Pain Score: 0-No pain Pain Type: Acute pain Pain Location: Leg Pain Orientation: Right Pain Descriptors / Indicators: Aching Pain Onset: With Activity Pain Intervention(s): Medication (See eMAR)(pre med  before therapy) Home Living/Prior [place] expects to be discharged to:: Private residence Living Arrangements: Spouse/significant  other Available Help at Discharge: Family, Available 24 hours/day Type of Home: House Home Access: Stairs to enter [employer] of Steps: 3 Entrance Stairs-Rails: Right, Left, Can reach both Home Layout: One level Bathroom Shower/Tub: Gaffer, Charity fundraiser: Standard Bathroom Accessibility: Yes Additional Comments: has shower seat  Lives With: Spouse IADL History Homemaking Responsibilities: Yes Meal Prep Responsibility: Secondary Current License: No Occupation: Retired Leisure and Hobbies: likes golf Prior Function Level of Independence: [place] for independence, Needs assistance with ADLs Dressing: Moderate  Able to Take Stairs?: Yes Driving: No Comments: has SPC at home intermittent use, typically uses rollator per therapy rec, lots of falls, spends a lot of time with grandson, likes golf ADL ADL Eating: Independent Where Assessed-Eating: Wheelchair Grooming: Setup Where Assessed-Grooming: Wheelchair Upper Body Bathing: Supervision/safety Where Assessed-Upper Body Bathing: Wheelchair Lower Body Bathing: Maximal assistance Where Assessed-Lower Body Bathing: Wheelchair, Sitting at sink, Standing at sink Upper Body Dressing: Supervision/safety Where Assessed-Upper Body Dressing: Wheelchair Lower Body Dressing: Maximal assistance Where Assessed-Lower Body Dressing: Wheelchair, Sitting at sink, Standing at sink Toileting: Maximal assistance Where Assessed-Toileting: Bedside Commode Toilet Transfer: Maximal assistance Toilet Transfer Method: Stand pivot Vision Baseline Vision/History: Wears glasses Wears Glasses: At all times Patient Visual Report: No change from baseline Vision Assessment?: No apparent visual deficits Perception  Perception: Within Functional Limits Praxis Praxis: Intact Cognition Overall Cognitive Status: Within Functional Limits for tasks assessed Arousal/Alertness: Awake/alert Orientation Level:  Person;Place;Situation Person: Oriented Place: Oriented Situation: Oriented Year: 2021 Month: February Day of Week: Incorrect(Monday) Memory: Appears intact Immediate Memory Recall: Sock;Blue;Bed Memory Recall Sock: Without Cue Memory Recall Blue: Without Cue Memory Recall Bed: Without Cue Attention: Sustained  Sustained Attention: Appears intact Awareness: Appears intact Problem Solving: Appears intact Sensation Sensation Light Touch: Impaired by gross assessment Hot/Cold: Appears Intact Proprioception: Impaired by gross assessment Stereognosis: Appears Intact Additional Comments: sensation intact in BLE, RLE dec distally to light touch compared to LLE Coordination Gross Motor Movements are Fluid and Coordinated: Yes(LLE WFL for functional tasks, RLE PWB 25% unable to asssess formally WFL throughout functional mobility) Fine Motor Movements are Fluid and Coordinated: Yes Motor  Motor Motor: Within Functional Limits Motor - Skilled Clinical Observations: generalized weakness with limitations in mobility secondary to increased pain in the right hip. Mobility  Transfers Sit to Stand: Maximal Assistance - Patient 25-49% Stand to Sit: Maximal Assistance - Patient 25-49%  Trunk/Postural Assessment  Cervical Assessment Cervical Assessment: Exceptions to WFL(cervical protraction with aspen collar in place) Thoracic Assessment Thoracic Assessment: Exceptions to WFL(thoracic rounding with scapular adduction) Lumbar Assessment Lumbar Assessment: Within Functional Limits(neutral pelvic tilt at EOC during bathing tasks)  Balance Balance Balance Assessed: Yes Static Sitting Balance Static Sitting - Balance Support: Bilateral upper extremity supported Static Sitting - Level of Assistance: 5: Stand by assistance Dynamic Sitting Balance Dynamic Sitting - Balance Support: During functional activity Dynamic Sitting - Level of Assistance: 4: Min Insurance risk surveyor  Standing - Balance Support: During functional activity Static Standing - Level of Assistance: 4: Min assist Dynamic Standing Balance Dynamic Standing - Balance Support: During functional activity Dynamic Standing - Level of Assistance: 2: Max assist Extremity/Trunk Assessment RUE Assessment RUE Assessment: Within Functional Limits LUE Assessment LUE Assessment: Within Functional Limits     Refer to Care Plan for Long Term Goals  Recommendations for other services: None    Discharge Criteria: Patient will be discharged from OT if patient refuses treatment 3 consecutive times without medical reason, if treatment goals not met, if there is a change in medical status, if patient makes no progress towards goals or if patient is discharged from hospital.  The above assessment, treatment plan, treatment alternatives and goals were discussed and mutually agreed upon: by patient  Danity Schmelzer OTR/L 02/10/2020, 12:55 PM

## 2020-02-11 ENCOUNTER — Inpatient Hospital Stay (HOSPITAL_COMMUNITY): Payer: Medicare Other

## 2020-02-11 ENCOUNTER — Inpatient Hospital Stay (HOSPITAL_COMMUNITY): Payer: Medicare Other | Admitting: Occupational Therapy

## 2020-02-11 DIAGNOSIS — R4189 Other symptoms and signs involving cognitive functions and awareness: Secondary | ICD-10-CM | POA: Insufficient documentation

## 2020-02-11 DIAGNOSIS — G4710 Hypersomnia, unspecified: Secondary | ICD-10-CM | POA: Insufficient documentation

## 2020-02-11 DIAGNOSIS — G4733 Obstructive sleep apnea (adult) (pediatric): Secondary | ICD-10-CM | POA: Insufficient documentation

## 2020-02-11 NOTE — IPOC Note (Signed)
Overall Plan of Care [facility]) Patient Details Name: [name]. MRN: [id number] DOB: [birth year]  Admitting Diagnosis: Closed intertrochanteric fracture of right hip, initial encounter [facility])  Hospital Problems: Principal Problem:   Closed intertrochanteric fracture of right hip, initial encounter [facility]) Active Problems:   Hip fracture ([place])     Functional Problem List: Nursing Bladder, Edema, Endurance, Medication Management, Pain, Perception, Safety, Skin Integrity  PT Balance, Endurance, Pain, Safety, Motor  OT Balance, Pain  SLP    TR         Basic ADL's: OT Grooming, Bathing, Dressing, Toileting     Advanced  ADL's: OT       Transfers: PT Bed Mobility, Bed to Chair, Car, Manufacturing systems engineer, Metallurgist: PT Ambulation, Stairs     Additional Impairments: OT None  SLP        TR      Anticipated Outcomes Item Anticipated Outcome  Self Feeding independent  Swallowing      Basic self-care  min guard to min assist  Toileting  min guard   Bathroom Transfers min guard  Bowel/Bladder  remain contienent of bowel, maintain continence of urine with urgency, maintain regular pattern of empyting bowel and bladder  Transfers  CGA  Locomotion  Supervision  Communication     Cognition     Pain  less than 4  Safety/Judgment  free of falls, skin breakdown and infection   Therapy Plan: PT Intensity: Minimum of 1-2 x/day ,45 to 90 minutes PT Frequency: 5 out of 7 days PT Duration Estimated Length of Stay: 14 days OT Intensity: Minimum of 1-2 x/day, 45 to 90 minutes OT Frequency: 5 out of 7 days OT Duration/Estimated Length of Stay: 12-14 days     Due to the current state of emergency, patients may not be receiving their 3-hours of Medicare-mandated therapy.   Team Interventions: Nursing Interventions Patient/Family Education, Bladder Management, Pain Management, Medication Management, Skin Care/Wound Management, Discharge Planning  PT  interventions Ambulation/gait training, DME/adaptive equipment instruction, Neuromuscular re-education, Pain management, Patient/family education, Stair training, Therapeutic Exercise, Therapeutic Activities, Splinting/orthotics, UE/LE Strength taining/ROM, Wheelchair propulsion/positioning, Functional mobility training  OT Interventions Balance/vestibular training, Discharge planning, Pain management, Self Care/advanced ADL retraining, Therapeutic Activities, Cognitive remediation/compensation, Functional mobility training, Patient/family education, Therapeutic Exercise, Community reintegration, DME/adaptive equipment instruction, Neuromuscular re-education, UE/LE Strength taining/ROM, Wheelchair propulsion/positioning  SLP Interventions    TR Interventions    SW/CM Interventions Discharge Planning, Disease Management/Prevention, Psychosocial Support, Patient/Family Education   Barriers to Discharge MD  Medical stability  Nursing      PT Home environment access/layout, Other (comments) 3 STE with B rails, PWB RLE restrictions  OT      SLP      SW Decreased caregiver support, Inaccessible home environment, Other (comments), Weight, Weight bearing restrictions 3 step entry to home with rails/grab bars   Team Discharge Planning: Destination: PT-Home ,OT- Home , SLP-  Projected Follow-up: PT-Home health PT, Outpatient PT, OT-  24 hour supervision/assistance, Home health OT, SLP-  Projected Equipment Needs: PT-To be determined, OT- To be determined, SLP-  Equipment Details: PT- , OT-  Patient/family involved in discharge planning: PT- Patient,  OT-Patient, SLP-   MD ELOS: 14-16d Medical Rehab Prognosis:  Good Assessment:  79 year old right-handed male with history of chronic diastolic congestive heart failure atrial fibrillation on Coumadin in the past discontinued 2014 due to 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  Patient received inpatient rehab service in June 2020 for debilitation related to CHF.  Per chart review patient lives with spouse.  Independent with assistive device.  1 level home 3 steps to entry.  Presented 02/04/2020 after ground-level fall without loss of consciousness sustaining a right intertrochanteric femur fracture as well as undisplaced fracture involving the lamina bilaterally at C6.[name]  Underwent closed intramedullary nailing of right femur fracture 02/04/2020 per Dr. 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 [name].  Patient is partial weightbearing 25% right lower extremity.  Patient was placed conservatively in a cervical collar for undisplaced fracture lamina of C6.  Hospital course acute blood loss anemia 9.2.  Blood pressure monitored bouts of orthostasis chronic diastolic congestive heart failure followed by cardiology services patient felt to be euvolemic home Demadex and Lasix were held.  He did receive a 500 cc bolus x1 on 2/18 and x2 on 2/19 and again repeated 2/21 maintained on Lovenox for DVT prophylaxis.  Therapy evaluations completed patient was admitted for a comprehensive rehab program   Now requiring 24/7 Rehab RN,MD, as well as CIR level PT, OT and SLP.  Treatment team will focus on ADLs and mobility with goals set at [facility] See Team Conference Notes for weekly updates to the plan of care

## 2020-02-11 NOTE — Progress Notes (Signed)
Occupational Therapy Session Note  Patient Details  Name: [name]. MRN: [id number] Date of Birth: 1941-02-05  Today's Date: 02/11/2020 OT Individual Time: 1105-1201 OT Individual Time Calculation (min): 56 min    Short Term Goals: Week 1:  OT Short Term Goal 1 (Week 1): Pt will complete LB bathing sit to stand with use of AE PRN and overall mod assist. OT Short Term Goal 2 (Week 1): Pt will perform LB dressing sit to stand with mod assist and AE PRN. OT Short Term Goal 3 (Week 1): Pt will complete stand pivot transfer to the bedside toilet with mod assist stand pivot for 2 consecutive attempts. OT Short Term Goal 4 (Week 1): Pt will complete toilet clothing management and toilet hygiene with mod assist sit to stand.  Skilled Therapeutic Interventions/Progress Updates:    Pt in wheelchair to start session.  He was agreeable to working on bathing and dressing sit to stand at the sink.  He was able to complete UB bathing and dressing with supervision.  LB bathing was completed with mod assist sit to stand with use of the RW.  Mod assist was also needed for LB dressing with the exception of donning his right shoe and left gripper sock, which was not removed or donned during this session.  He was able to complete grooming tasks of brushing his hair and washing his face with setup from the wheelchair.  Increased RLE pain with sit to stand is noted, but pt able to tolerate with increased time.  Finished session with positioning in the wheelchair with RLE placed on elevating leg rest.  Call button and phone was in reach with safety belt in place.    Therapy Documentation Precautions:  Precautions Precautions: Fall, Other (comment) Precaution Comments: Partial WB 25% RLE Required Braces or Orthoses: Cervical Brace Cervical Brace: Hard collar, For comfort Restrictions Weight Bearing Restrictions: Yes RLE Weight Bearing: Partial weight bearing RLE Partial Weight Bearing Percentage or  Pounds: 25%  Pain: Pain Assessment Pain Scale: Faces Faces Pain Scale: Hurts little more Pain Type: Surgical pain Pain Location: Hip Pain Orientation: Right Pain Descriptors / Indicators: Discomfort Pain Onset: With Activity Pain Intervention(s): Repositioned;Emotional support ADL: See Care Tool Section for some details of mobility and selfcare  Therapy/Group: Individual Therapy  Irena Gaydos OTR/L 02/11/2020, 12:17 PM

## 2020-02-11 NOTE — Procedures (Signed)
  Name:  [name], [name] Reference [id number]  Study Date: 01/29/2020 Procedure #: [id number]  DOB: 09/10/1941    Protocol  This is a 13 channel Multiple Sleep Latency Test comprised of 5 channels of EEG (T3-Cz, Cz-T4, F4-M1, C4-M1, O2-M1), 3 channels of Chin EMG, 4 channels of EOG and 1 channel for ECG.   All channels were sampled at 256hz.    This polysomnographic procedure is designed to evaluate (1) the complaint of excessive daytime sleepiness by quantifying the time required to fall asleep and (2) the possibility of narcolepsy by checking for abnormally short latencies to REM sleep.  Electrographic variables include EEG, EMG, EOG and ECG.  Patients are monitored throughout four or five 20-minute opportunities to sleep (naps) at two-hour intervals.  For each nap, the patient is allowed 20 minutes to fall asleep.  Once asleep, the patient is awakened after 15 minutes.  Between naps, the patient is kept as alert as possible.  A sleep latency of 20 minutes indicates that no sleep occurred.  Parametric Analysis  Total Number of Naps 5     NAP # Time of Nap  Sleep Latency (mins) REM Latency (mins) Sleep Time Percent Awake Time Percent  1 07:20 8.5 0 33 67   2 09:24 4 0 75  25   3 11:21 4.5 0 69  31   4 13:26 12 0 49  51   5 15:13 3 0 74  26    MSLT Summary of Naps  Sleepiness Index: 68  Mean Sleep Latency to all Five Naps (in minutes) : 6.4  Mean Sleep Latency to First Four Naps: 7.25  Mean Sleep Latency to First Three Naps: 5.7  Mean Sleep Latency to First Two Naps: 6.25  Number of Naps with REM Sleep: 0    Results from Preceding PSG Study  Sleep Onset Time 22:15 Sleep Efficiency (%) 91.1  Rise Time AM 05:35 Sleep Latency (min) 8  Total Sleep Time (min)  405.5 REM Latency (min) 44    I attest to having reviewed every epoch of the entire raw data recording prior to the issuance of this report in accordance with the Standards of the American Academy of Sleep Medicine.      Name:  [name], [name] Reference #:  [id number]  Study Date: 01/29/2020 DOB: 03-06-1941     IMPRESSION:  1. This multiple sleep latency test reveals a mean sleep latency of 6.4 minutes with a total of 5 sleep periods during which sleep was recorded.  No REM sleep periods were recorded.   2. This study was preceded by an overnight polysomnogram with a total sleep time (TST) of 405 minutes and REM sleep latency of 44 minutes.       RECOMMENDATIONS: This MSLT study indicates the presence of sleepiness.  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 This study is not consistent with a diagnosis of narcolepsy.    Larey Seat, M.D. Diplomat, Tax adviser of Psychiatry and Neurology  Diplomat, Tax adviser of Sleep Medicine Market researcher, [employer] [facility] at [employer]   PS : I will correlate the findings of MSLT and toxicology screen and HLA narcolepsy panel test in my result note.

## 2020-02-11 NOTE — Procedures (Signed)
PATIENT'S NAME:  [name], Ende DOB:      Nov 09, 1941      MR#:    [id number]     DATE OF RECORDING: 01/28/2020 REFERRING M.D.:  Butler Denmark, NP Study Performed:   Positive Airway Pressure Titration for MSLT to follow. HISTORY:  Mr. Overbeck is a 79 year old male with history of sleep apnea on CPAP, stroke, and memory disturbance.  He has complained of persistent daytime fatigue, easily falls asleep during the day.  He also has history of hallucinations.  He tried to move his Aricept dosing in the morning, but that did not impact his hallucinations.  After his last visit, he was taken off Aricept.   His residual AHI was 1.6/h on autotitration capable CPAP with a pressure range of 6-16 cm water and 3 cm EPR, ResMed F 30 FFM . Due to high Epworth score (EDS), the question of Narcolepsy was raised. He was tested by HLA for narcolepsy trait, this was negative, thus leaving a low yield for the diagnosis.  MSLT was ordered.   (The patient was supposingly instructed to wean off all tricyclica, SSRis, anticholinergic medications, any sedatives and stimulants in preparation and be off those medications for 3 weeks).    The patient endorsed the Epworth Sleepiness Scale at 24/24 points.   The patient's weight 187 pounds with a height of 68 (inches), resulting in a BMI of 28.4 kg/m2. The patient's neck circumference measured 17 inches.  CURRENT MEDICATIONS: Zyloprim, Ferrous sulfate, Lasix, Lopressor, Myrbetriq, Protonix, Klor-con, Crestor, Zoloft, Flomax. The patient had been s asked to discontinue Zoloft and gabapentin.   PROCEDURE:  This is a multichannel digital polysomnogram utilizing the SomnoStar 11.2 system.  Electrodes and sensors were applied and monitored per AASM Specifications.   EEG, EOG, Chin and Limb EMG, were sampled at 200 Hz.  ECG, Snore and Nasal Pressure, Thermal Airflow, Respiratory Effort, CPAP Flow and Pressure, Oximetry was sampled at 50 Hz. Digital video and audio were recorded.        CPAP was initiated at 8 cmH20 with heated humidity per AASM split night standards and pressure was advanced to 13.0cmH20 because of hypopneas, apneas and desaturations.  At a PAP pressure of 13 cmH20, there was a reduction of the AHI to 0 with improvement of sleep apnea for a sleep time of 163 minutes total.   Lights Out was at 22:10 and Lights On at 05:35. Total recording time (TRT) was 445 minutes, with a total sleep time (TST) of 405.5 minutes. The patient's sleep latency was 8 minutes. REM latency was 44 minutes.  The sleep efficiency was 91.1 %.    SLEEP ARCHITECTURE: WASO (Wake after sleep onset)  was 35.5 minutes.  There were 21 minutes in Stage N1, 239 minutes Stage N2, 36.5 minutes Stage N3 and 109 minutes in Stage REM.  The percentage of Stage N1 was 5.2%, Stage N2 was 58.9%, Stage N3 was 9.% and Stage R (REM sleep) was 26.9%.      RESPIRATORY ANALYSIS:  There was a total of 7 respiratory events: 0 obstructive apneas, 0 central apneas and 0 mixed apneas with a total of 0 apneas and an apnea index (AI) of 0 /hour. There were 7 hypopneas with a hypopnea index of 1./hour. The patient also had 0 respiratory event related arousals (RERAs).      The total APNEA/HYPOPNEA INDEX  (AHI) was 1. /hour and the total RESPIRATORY DISTURBANCE INDEX was 1. /hour  4 events occurred in REM sleep and  3 events in NREM. The REM AHI was 2.2 /hour versus a non-REM AHI of 0.6 /hour.  The patient spent 405.5 minutes of total sleep time in the supine position and 0 minutes in non-supine. The supine AHI was 1.0, versus a non-supine AHI of 0.0.  OXYGEN SATURATION & C02:  The baseline 02 saturation was 98%, with the lowest being 92%. Time spent below 89% saturation equaled 0 minutes.  The arousals were noted as: 30 were spontaneous, 2 were associated with PLMs, 3 were associated with respiratory events. The patient had a total of 33 Periodic Limb Movements. The Periodic Limb Movement (PLM) index was 4.9 and the  PLM Arousal index was 0.3 /hour.  Audio and video analysis did not show any abnormal or unusual movements, behaviors, phonations or vocalizations.  Snoring was controlled. One episode of sleep talking.  EKG was in keeping with sinus rhythm and documented twice sinus tachycardia, paced rhythm.    DIAGNOSIS 1. Obstructive Sleep Apnea well controlled on CPAP in a setting comparable to his home machine.  2. Paced EKG. 3. High sleep efficiency 4. Normal REM sleep latency.     PLANS/RECOMMENDATIONS: valid study for MSLT to follow. Tox screen will be obtained at [place].   A follow up appointment will be scheduled in the Sleep Clinic at [facility].   Please call [phone] with any questions.      I certify that I have reviewed the entire raw data recording prior to the issuance of this report in accordance with the Standards of Accreditation of the American Academy of Sleep Medicine (AASM)    Larey Seat, M.D. Diplomat, Tax adviser of Psychiatry and Neurology  Diplomat, Tax adviser of Sleep Medicine Market researcher, [employer] [facility] at [employer]

## 2020-02-11 NOTE — Plan of Care (Signed)
  Problem: Consults Goal: RH GENERAL PATIENT EDUCATION Description: See Patient Education module for education specifics. Outcome: Progressing   Problem: RH BLADDER ELIMINATION Goal: RH STG MANAGE BLADDER WITH ASSISTANCE Description: STG Manage Bladder With Min Assistance Outcome: Progressing   Problem: RH SKIN INTEGRITY Goal: RH STG ABLE TO PERFORM INCISION/WOUND CARE W/ASSISTANCE Description: STG Able To Perform Incision/Wound Care With Assistance. Min Outcome: Progressing   Problem: RH SAFETY Goal: RH STG ADHERE TO SAFETY PRECAUTIONS W/ASSISTANCE/DEVICE Description: STG Adhere to Safety Precautions With Assistance/Device. MIn Outcome: Progressing   Problem: RH PAIN MANAGEMENT Goal: RH STG PAIN MANAGED AT OR BELOW PT'S PAIN GOAL Description: Less than 3 Outcome: Progressing

## 2020-02-11 NOTE — Progress Notes (Signed)
Physical Therapy Session Note  Patient Details  Name: [name]. MRN: [id number] Date of Birth: [birth year]  Today's Date: 02/11/2020 PT Individual Time: 0802-0915 PT Individual Time Calculation (min): 73 min    Short Term Goals: Week 1:  PT Short Term Goal 1 (Week 1): Patient will perform sit<>stand from elevated surface with modA PT Short Term Goal 2 (Week 1): Patient will perform stand pivot transfer with minA PT Short Term Goal 3 (Week 1): Patient will participate in stair training PT Short Term Goal 4 (Week 1): Patient will ambulate 55'  on level surfaces with minA  Skilled Therapeutic Interventions/Progress Updates:    Patient supine in bed upon PT arrival, agreeable to therapy tx, denies pain at rest. Pt reported pain as high as 8/10 throughout the session, therapist provided additional rest breaks intermittently and gave pt option to stop exercise if needed but pt persisted and wanted to continue. MD in/out during session for morning assessment. Therapist applied ice bag to R hip at end of session. Therapist donned L sock and post-op WB shoe totalA. Pt performed AROM of RLE and LLE followed by PROM from therapist, restriction with RLE hip flexion d/t muscle guarding 2/2 pain and stiffness of LLE hip flexion 90-100*.   The  following exercises were performed supine in bed, with additional time to complete each rep and rest breaks between each exercise:  - RLE heel slides 2 x 5  - RLE SAQ 2 x 5  - RLE abduction x 5  - RLE adduction x 5  Supine > sit with minA for trunk upright and LE management off the bed. Pt sitting EOB donned aspen cervical collar with supervision. MD in/out to report aspen cervical collar no longer necessary based on last imaging of c-spine. Pt performed sit <> stand to RW with RLE PWB from elevated surface with minA-modA for trunk upright as therapist dec ht of bed x 5 reps, cues for sequencing, pt performed best with L hand on walker and pushing up from bed  with R hand. Last rep of sit > stand pt initiated gait 25' with minA and w/c follow to allow rest breaks + 10' in day room after therapist altered pt's gait pattern with cues to keep RLE in front rather than with knee flexed behind self to allow for TDWB and avoid breaking WB precautions. Pt transported back to room in w/c for time management. Pt left in w/c with needs in reach and chair alarm set.    Therapy Documentation Precautions:  Precautions Precautions: Fall, Other (comment) Precaution Comments: Partial WB 25% RLE Required Braces or Orthoses: Cervical Brace Cervical Brace: Hard collar, For comfort Restrictions Weight Bearing Restrictions: Yes RLE Weight Bearing: Partial weight bearing RLE Partial Weight Bearing Percentage or Pounds: 25%   Therapy/Group: Individual Therapy  Juliann Pulse SPT 02/11/2020, 7:15 AM

## 2020-02-11 NOTE — Progress Notes (Signed)
Occupational Therapy Session Note  Patient Details  Name: [name]. MRN: [id number] Date of Birth: [birth year]  Today's Date: 02/11/2020 OT Individual Time: 1300-1400 OT Individual Time Calculation (min): 60 min    Short Term Goals: Week 1:  OT Short Term Goal 1 (Week 1): Pt will complete LB bathing sit to stand with use of AE PRN and overall mod assist. OT Short Term Goal 2 (Week 1): Pt will perform LB dressing sit to stand with mod assist and AE PRN. OT Short Term Goal 3 (Week 1): Pt will complete stand pivot transfer to the bedside toilet with mod assist stand pivot for 2 consecutive attempts. OT Short Term Goal 4 (Week 1): Pt will complete toilet clothing management and toilet hygiene with mod assist sit to stand.  Skilled Therapeutic Interventions/Progress Updates:    Pt received sitting up in the w/c with no c/o pain at rest. Pt propelled w/c to the therapy gym with (S), 100 ft. Pt had a skin tear on his L forearm that was bandaged appropriately. Pt completed stand pivot transfer, requiring mod A to power up from w/c and min A for transfer using the RW. Pt completed standing balance activity with alternating UE release from RW to complete straight arm raises with a 3 # dumbbell, with min A overall for balance. Intermittent cueing for PWB RLE. Pt completed BUE strengthening circuit with 6 # dumbbells to improve BUE endurance/strength needed to improve ADL transfers and w/c propulsion. Pt returned to his room and transferred back to bed, min A to lift RLE. Pt left supine with all needs met, bed alarm set.   Therapy Documentation Precautions:  Precautions Precautions: Fall, Other (comment) Precaution Comments: Partial WB 25% RLE Required Braces or Orthoses: Cervical Brace Cervical Brace: Hard collar, For comfort Restrictions Weight Bearing Restrictions: Yes RLE Weight Bearing: Partial weight bearing RLE Partial Weight Bearing Percentage or Pounds: 25%  Therapy/Group:  Individual Therapy  Curtis Sites 02/11/2020, 7:01 AM

## 2020-02-11 NOTE — Patient Care Conference (Signed)
Inpatient RehabilitationTeam Conference and Plan of Care Update Date: 02/11/2020   Time: 10:35 AM    Patient Name: [name].      Medical Record Number: [id number]  Date of Birth: [birth year] Sex: Male         Room/Bed: 4W18C/4W18C-01 Payor Info: Payor: MEDICARE / Plan: MEDICARE PART A AND B / Product Type: *No Product type* /    Admit Date/Time:  02/09/2020  2:22 PM  Primary Diagnosis:  Closed intertrochanteric fracture of right hip, initial encounter [facility])  Patient Active Problem List   Diagnosis Date Noted  . Hip fracture ([place]) 02/09/2020  . Osteomyelitis of second toe of right foot ([place])   . Closed right hip fracture, initial encounter ([place]) 02/04/2020  . Closed intertrochanteric fracture of right hip, initial encounter ([place]) 02/04/2020  . Closed nondisplaced intertrochanteric fracture of right femur ([place])   . Symptomatic bradycardia 09/24/2019  . Chronic diastolic heart failure ([place]) 06/03/2019  . Coronary artery disease involving native heart without angina pectoris 06/03/2019  . Debility 05/26/2019  . 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 Toe infection 05/17/2019  . DOE (dyspnea on exertion) 05/15/2019  . Severe tricuspid regurgitation 05/15/2019  . Chronic right-sided CHF (congestive heart failure) ([place]) 05/15/2019  . Cellulitis of fourth toe of left foot   . Excessive daytime sleepiness 02/11/2019  . 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[place]) 02/11/2019  . Iron deficiency anemia 02/04/2019  . Poor memory 12/16/2018  . Deficiency anemia 11/19/2018  . Other fatigue 11/19/2018  . History of elevated PSA 11/19/2018  . Prostate cancer screening 11/19/2018  . Prostate CA ([place]) 11/19/2018  . Pancytopenia, acquired ([place]) 08/13/2018  . Recurrent falls 08/13/2018  . 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 Peripheral neuropathy 07/31/2018  . 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 Anemia, chronic  disease 01/28/2018  . History of skin cancer 07/27/2017  . Fatty liver disease, nonalcoholic [id number]  . Thrombocytopenia ([place]) 11/27/2016  . Lung nodule 04/23/2014  . Postinflammatory pulmonary fibrosis ([place]) 06/13/2013  . Long term (current) use of anticoagulants 11/22/2011  . Abdominal bruit 03/07/2011  . Pacemaker-St.[employer]ude   . Hx of CABG   . Hyperlipidemia type II   . OSA (obstructive sleep apnea)   . PACEMAKER, PERMANENT 08/17/2010  . HYPERLIPIDEMIA 08/16/2010  . Obesity 08/16/2010  . Essential hypertension 08/16/2010  . Atrial fibrillation ([place]) 08/16/2010  . IRREGULAR HEART RATE 08/16/2010  . Sleep apnea 08/16/2010  . BENIGN PROSTATIC HYPERTROPHY, HX OF 08/16/2010    Expected Discharge Date: Expected Discharge Date: 02/25/20  Team Members Present: Nurse Present: Dorien Chihuahua, RN;Other (comment)(Olivia Latanya Maudlin, RN) Case Manager: Karene Fry, RN PT Present: Michaelene Song, PT OT Present: Clyda Greener, OT SLP Present: Jettie Booze, CF-SLP PPS Coordinator present : Gunnar Fusi, SLP     Current Status/Progress Goal Weekly Team Focus  Bowel/Bladder   Patient is continent of bowel and bladder, LBM: 02/23  Maintain bowel and bladder continence  assist with toileting needs   Swallow/Nutrition/ Hydration             ADL's   Supervision for UB selfcare, max assist for LB selfcare and mod to max for transfers stand pivot  min guard overall  selfcare retraining, transfer training, therapeutic activities, balance retraining, AE/DME education, pt education   Mobility   modA bed mobility, minA supine>sit, maxA sit<>stand, min-modA stand pivot transfer, minA gait 10' with RW  CGA transfer, supervision gait, minA sit<>stand  transfers,  standing balance, gait, activity tolerance   Communication             Safety/Cognition/ Behavioral Observations            Pain   patient c/o pain in R hip surgical incision  pain level <4  q shift and PRN pain assessment   Skin    patient has surgical wound in R hip, wound in R foot, and chronic skin issue in L cheek  prevent skin from breakdown and infection  q shift and PRN skin assessment and dressing change    Rehab Goals Patient on target to meet rehab goals: Yes *See Care Plan and progress notes for long and short-term goals.     Barriers to Discharge  Current Status/Progress Possible Resolutions Date Resolved   Nursing                  PT  Home environment access/layout;Other (comments)  3 STE with B rails, PWB RLE restrictions              OT                  SLP                SW Decreased caregiver support;Inaccessible home environment;Other (comments);Weight;Weight bearing restrictions 3 step entry to home with rails/grab bars Plan to discharge home with wife          Discharge Planning/Teaching Needs:  Discharge home with wife  TBD   Team Discussion: Severe DM, peripheral neuropathy, balance problems, recent toe amp, no need for cervical orthosis, in with R hip fx.  RN pain med given, sutures R hip, cont B/B.  OT S UB ADL, LB B/D mod/max, transfers mod/max, goals CGA.  PT pain with movement, min supine to sit, mod/max sit to stand, transfers mod A, min A gait, goals CGA/S.   Revisions to Treatment Plan: N/A     Medical Summary Current Status: Cervical fracture is healed no need for cervical orthosis, right hip fracture still with postop dressing.  Touchdown weightbearing, severe diabetic peripheral neuropathy Weekly Focus/Goal: Educated patient on fall prevention, should be able to remove staples next week  Barriers to Discharge: Medical stability;Other (comments);Behavior   Possible Resolutions to Barriers: Safety awareness remains a barrier has had multiple falls throughout the year, decreased sensation in feet is likely the major physical issue   Continued Need for Acute Rehabilitation Level of Care: The patient requires daily medical management by a physician with specialized training in  physical medicine and rehabilitation for the following reasons: Direction of a multidisciplinary physical rehabilitation program to maximize functional independence : Yes Medical management of patient stability for increased activity during participation in an intensive rehabilitation regime.: Yes Analysis of laboratory values and/or radiology reports with any subsequent need for medication adjustment and/or medical intervention. : Yes   I attest that I was present, lead the team conference, and concur with the assessment and plan of the team.   Jodell Cipro M 02/11/2020, 1:59 PM   Team conference was held via web/ teleconference due to Mila Doce - 19

## 2020-02-11 NOTE — Progress Notes (Signed)
DIAGNOSIS  1. Obstructive Sleep Apnea well controlled on CPAP in a setting  comparable to his home machine.  2. Paced EKG.  3. High sleep efficiency  4. Normal REM sleep latency.     PLANS/RECOMMENDATIONS: valid study for MSLT to follow.   A Tox screen  will be obtained at [facility].   A follow up appointment will be scheduled in the Sleep Clinic at  [facility].  Please call [phone] with  any questions.

## 2020-02-11 NOTE — Progress Notes (Signed)
[place] PHYSICAL MEDICINE & REHABILITATION PROGRESS NOTE   Subjective/Complaints: No issues overnite , no neck pain, pt states neck injury from last spring or summer ROS- denies, CP, SOB, N/V/D Objective:   VAS [place] LOWER EXTREMITY VENOUS (DVT)  Result Date: 02/10/2020  Lower Venous DVTStudy Indications: Swelling.  Comparison Study: No prior study. Performing Technologist: Maudry Mayhew MHA, RDMS, RVT, RDCS  Examination Guidelines: A complete evaluation includes B-mode imaging, spectral Doppler, color Doppler, and power Doppler as needed of all accessible portions of each vessel. Bilateral testing is considered an integral part of a complete examination. Limited examinations for reoccurring indications may be performed as noted. The reflux portion of the exam is performed with the patient in reverse Trendelenburg.  +---------+---------------+---------+-----------+----------+--------------+ RIGHT    CompressibilityPhasicitySpontaneityPropertiesThrombus Aging +---------+---------------+---------+-----------+----------+--------------+ CFV      Full           No       Yes                                 +---------+---------------+---------+-----------+----------+--------------+ SFJ      Full                                                        +---------+---------------+---------+-----------+----------+--------------+ FV Prox  Full                                                        +---------+---------------+---------+-----------+----------+--------------+ FV Mid   Full                                                        +---------+---------------+---------+-----------+----------+--------------+ FV DistalFull                                                        +---------+---------------+---------+-----------+----------+--------------+ PFV      Full                                                         +---------+---------------+---------+-----------+----------+--------------+ POP      Full           No       Yes                                 +---------+---------------+---------+-----------+----------+--------------+ PTV      Full                                                        +---------+---------------+---------+-----------+----------+--------------+  PERO     Full                                                        +---------+---------------+---------+-----------+----------+--------------+   +---------+---------------+---------+-----------+----------+--------------+ LEFT     CompressibilityPhasicitySpontaneityPropertiesThrombus Aging +---------+---------------+---------+-----------+----------+--------------+ CFV      Full           No       Yes                                 +---------+---------------+---------+-----------+----------+--------------+ SFJ      Full                                                        +---------+---------------+---------+-----------+----------+--------------+ FV Prox  Full                                                        +---------+---------------+---------+-----------+----------+--------------+ FV Mid   Full                                                        +---------+---------------+---------+-----------+----------+--------------+ FV DistalFull                                                        +---------+---------------+---------+-----------+----------+--------------+ PFV      Full                                                        +---------+---------------+---------+-----------+----------+--------------+ POP      Full           No       Yes                                 +---------+---------------+---------+-----------+----------+--------------+ PTV      Full                                                         +---------+---------------+---------+-----------+----------+--------------+ PERO     Full                                                        +---------+---------------+---------+-----------+----------+--------------+  Summary: RIGHT: - There is no evidence of deep vein thrombosis in the lower extremity.  - No cystic structure found in the popliteal fossa.  LEFT: - There is no evidence of deep vein thrombosis in the lower extremity.  - No cystic structure found in the popliteal fossa.  Pulsatile venous flow is suggestive of possible elevated right heart pressure.  *See table(s) above for measurements and observations. Electronically signed by Servando Snare MD on 02/10/2020 at 6:07:00 PM.    Final    Recent Labs    02/09/20 0432 02/10/20 0552  WBC 3.3* 3.6*  HGB 9.4* 9.4*  HCT 29.0* 28.9*  PLT 70* 87*   Recent Labs    02/09/20 0432 02/10/20 0552  NA 137 138  K 5.0 4.8  CL 103 102  CO2 26 27  GLUCOSE 107* 93  BUN 31* 33*  CREATININE 1.18 1.23  CALCIUM 8.9 9.0    Intake/Output Summary (Last 24 hours) at 02/11/2020 0836 Last data filed at 02/11/2020 0700 Gross per 24 hour  Intake 210 ml  Output 1575 ml  Net -1365 ml     Physical Exam: Vital Signs Blood pressure 110/60, pulse (!) 59, temperature 98 F (36.7 C), temperature source Oral, resp. rate 18, height '5\' 7"'  (1.702 m), weight 83.4 kg, SpO2 100 %.   General: No acute distress Mood and affect are appropriate Heart: Regular rate and rhythm no rubs murmurs or extra sounds Lungs: Clear to auscultation, breathing unlabored, no rales or wheezes Abdomen: Positive bowel sounds, soft nontender to palpation, nondistended Extremities: No clubbing, cyanosis, or edema Skin: No evidence of breakdown, no evidence of rash Neurologic: Cranial nerves II through XII intact, motor strength is 5/5 in bilateral deltoid, bicep, tricep, grip,2- Right and 4/5 left  hip flexor, knee extensors, ankle dorsiflexor and plantar  flexor   Musculoskeletal: Full range of motion in all 4 extremities. No joint swelling      Assessment/Plan: 1. Functional deficits secondary to Right IT hip fx  which require 3+ hours per day of interdisciplinary therapy in a comprehensive inpatient rehab setting.  Physiatrist is providing close team supervision and 24 hour management of active medical problems listed below.  Physiatrist and rehab team continue to assess barriers to discharge/monitor patient progress toward functional and medical goals  Care Tool:  Bathing    Body parts bathed by patient: Right arm, Left arm, Chest, Abdomen, Right upper leg, Left upper leg, Face   Body parts bathed by helper: Right lower leg, Left lower leg, Front perineal area, Buttocks     Bathing assist Assist Level: Maximal Assistance - Patient 24 - 49%     Upper Body Dressing/Undressing Upper body dressing   What is the patient wearing?: Pull over shirt    Upper body assist Assist Level: Supervision/Verbal cueing    Lower Body Dressing/Undressing Lower body dressing      What is the patient wearing?: Pants, Incontinence brief     Lower body assist Assist for lower body dressing: Maximal Assistance - Patient 25 - 49%     Toileting Toileting    Toileting assist Assist for toileting: Maximal Assistance - Patient 25 - 49%     Transfers Chair/bed transfer  Transfers assist     Chair/bed transfer assist level: Maximal Assistance - Patient 25 - 49%     Locomotion Ambulation   Ambulation assist      Assist level: Minimal Assistance - Patient > 75% Assistive device: Walker-rolling Max distance: 10'   Walk  10 feet activity   Assist     Assist level: Minimal Assistance - Patient > 75% Assistive device: Walker-rolling   Walk 50 feet activity   Assist Walk 50 feet with 2 turns activity did not occur: Safety/medical concerns(2/2 pain in RLE and pt fatigue)         Walk 150 feet activity   Assist Walk  150 feet activity did not occur: Safety/medical concerns         Walk 10 feet on uneven surface  activity   Assist Walk 10 feet on uneven surfaces activity did not occur: Safety/medical concerns         Wheelchair     Assist   Type of Wheelchair: Manual    Wheelchair assist level: Supervision/Verbal cueing Max wheelchair distance: 150'    Wheelchair 50 feet with 2 turns activity    Assist        Assist Level: Supervision/Verbal cueing   Wheelchair 150 feet activity     Assist      Assist Level: Supervision/Verbal cueing   Blood pressure 110/60, pulse (!) 59, temperature 98 F (36.7 C), temperature source Oral, resp. rate 18, height '5\' 7"'  (1.702 m), weight 83.4 kg, SpO2 100 %.  Medical Problem List and Plan:  1.  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  Partial weightbearing 25% right lower extremity Rehab evals to day , team conf in am              -patient may  shower with wrapping over right foot and ankle Team conference today please see physician documentation under team conference tab, met with team  to discuss problems,progress, and goals. Formulized individual treatment plan based on medical history, underlying problem and comorbidities.             -ELOS/Goals: 14 to 16 days with supervision to min assist goals for ADLs and mobility  2.  Antithrombotics:  -DVT/anticoagulation: Lovenox.  Check vascular study              -antiplatelet therapy: N/A  3. Pain Management: Hydrocodone and Robaxin as needed  4. Mood: Zoloft 200 mg daily              -antipsychotic agents: N/A  5. Neuropsych: This patient is capable of making decisions on his own behalf.  6. Skin/Wound Care: Routine skin checks  7. Fluids/Electrolytes/Nutrition: Routine in and outs with follow-up chemistries, elevated  BUN, encourage fluids   8.  Acute on chronic anemia.  Followed by Dr. [name]. continue iron supplement.  Follow-up CBC  9.  Undisplaced fracture lamina bilaterally of C6.  healed as of CT 2/17 CT CERVICAL SPINE FINDINGS  Alignment: No static subluxation. Facets are aligned. Occipital condyles are normally positioned.  Skull base and vertebrae: No acute fracture.  Soft tissues and spinal canal: No prevertebral fluid or swelling. No visible canal hematoma.  Disc levels: No bony spinal canal stenosis. Multilevel mild vertebral body height loss, particularly at C3 and C4. Facet hypertrophy is worst at right C3-4 and left C4-5.  Upper chest: No pneumothorax, pulmonary nodule or pleural effusion.  Other: Normal visualized paraspinal cervical soft tissues.  IMPRESSION: 1. Chronic ischemic microangiopathy without acute intracranial abnormality. 2. No acute fracture or static subluxation of the cervical spine.   Electronically Signed   By: Ulyses Jarred M.D.   On: 02/04/2020 00:54  10.  Chronic  diastolic congestive heart failure.  Followed by Dr. [name] monitor for any signs of fluid overload.  Patient currently euvolemic no edema home Demadex Lasix on hold resume in 1 to 2 days.  11.  History of atrial fibrillation.  Coumadin in the past discontinued 2014 due to spontaneous subdural hematoma with craniotomy.  Toprol 12.5 mg 12.5 mg  Daily. Cardiac rate controlled Vitals:   02/10/20 1933 02/11/20 0510  BP: (!) 105/56 110/60  Pulse: (!) 59 (!) 59  Resp: 18 18  Temp: 98 F (36.7 C) 98 F (36.7 C)  SpO2: 100% 100%  Controlled 2/24  12.  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  Follow-up chemistries  13.  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  No chest pain or shortness of breath  14.  Diet-controlled diabetes mellitus..  CBGs discontinued  15.  BPH.  Flomax 0.4 mg nightly,Myrbetriq 50 mg daily.  Check PVR  16.  Hyperlipidemia.  Crestor 40 mg daily  17.   OSA/pulmonary fibrosis.  CPAP.  Patient has been refusing at times.       LOS: 2 days A FACE TO FACE EVALUATION WAS PERFORMED  Charlett Blake 02/11/2020, 8:36 AM

## 2020-02-12 ENCOUNTER — Inpatient Hospital Stay (HOSPITAL_COMMUNITY): Payer: Medicare Other | Admitting: Occupational Therapy

## 2020-02-12 ENCOUNTER — Inpatient Hospital Stay (HOSPITAL_COMMUNITY): Payer: Medicare Other | Admitting: Physical Therapy

## 2020-02-12 NOTE — Progress Notes (Signed)
Team Conference Report to Patient/Family  Team Conference discussion was reviewed with the patient and caregiver, including goals, any changes in plan of care and target discharge date.  Patient and caregiver express understanding and are in agreement.  The patient has a target discharge date of 02/25/20. Family education set up for 02/23/20 @ 9a.  Margarito Liner 02/12/2020, 9:36 AM

## 2020-02-12 NOTE — Progress Notes (Signed)
Physical Therapy Session Note  Patient Details  Name: [name]. MRN: [id number] Date of Birth: 1941-07-29  Today's Date: 02/12/2020 PT Individual Time: [id number] PT Individual Time Calculation (min): 69 min   Short Term Goals: Week 1:  PT Short Term Goal 1 (Week 1): Patient will perform sit<>stand from elevated surface with modA PT Short Term Goal 2 (Week 1): Patient will perform stand pivot transfer with minA PT Short Term Goal 3 (Week 1): Patient will participate in stair training PT Short Term Goal 4 (Week 1): Patient will ambulate 70'  on level surfaces with minA  Skilled Therapeutic Interventions/Progress Updates:   Pt in supine and agreeable to therapy, pain primarily in R hip today - 5/10 (premedicated). Supine>sit w/ mod assist for RLE management. Sit>stand w/ min assist and min assist stand pivot to w/c. Verbal cues for RLE WB precautions. Pt self-propelled w/c to/from therapy gym w/ BUEs. Worked on RLE strengthening/ROM in seated, see detailed below. Tactile and verbal cues for technique and isolation of musculature. Worked on sit<>stands to RW intermittently between sets. Sit>stand w/ min assist and verbal/tactile cues for technique w/ UE placement and weight shifting to keep weight off RLE. Mod assist to stand fading to min assist w/ repetition.   RLE strengthening/ROM:  -LAQ 3x10 -heel slides 3x10 -knee march 3x5 -sit<>stands x3 -adduction squeezes 3x10 -resisted hip abduction w/ orange TB, 3x5   Returned to room and ended session in w/c, all needs in reach. Ice applied to R lateral thigh for pain relief.    Therapy Documentation Precautions:  Precautions Precautions: Fall, Other (comment) Precaution Comments: Partial WB 25% RLE Required Braces or Orthoses: Cervical Brace Cervical Brace: Hard collar, For comfort Restrictions Weight Bearing Restrictions: Yes RLE Weight Bearing: Partial weight bearing RLE Partial Weight Bearing Percentage or Pounds:  25% Vital Signs: Therapy Vitals Pulse Rate: (!) 58 Resp: 16 BP: 105/75 Patient Position (if appropriate): Sitting Oxygen Therapy SpO2: 96 % O2 Device: Room Air  Therapy/Group: Individual Therapy  Zaniel Marineau Clent Demark 02/12/2020, 12:38 PM

## 2020-02-12 NOTE — Progress Notes (Signed)
Physical Therapy Session Note  Patient Details  Name: [name]. MRN: [id number] Date of Birth: [birth year]  Today's Date: 02/12/2020 PT Individual Time: 0805-0900 PT Individual Time Calculation (min): 55 min   Short Term Goals: Week 1:  PT Short Term Goal 1 (Week 1): Patient will perform sit<>stand from elevated surface with modA PT Short Term Goal 2 (Week 1): Patient will perform stand pivot transfer with minA PT Short Term Goal 3 (Week 1): Patient will participate in stair training PT Short Term Goal 4 (Week 1): Patient will ambulate 45'  on level surfaces with minA  Skilled Therapeutic Interventions/Progress Updates:   Pt received supine in bed and agreeable to therapy session. Reports not sleeping well last night due to "anxiety" - returned to bed at end of session to rest.  Supine>sitting EOB, HOB partially elevated and using bedrail, with min assist for R LE management due to pain. Pt requesting to change clothes. Sitting EOB doffed and donned clean shirt with set-up assist and pants with total assist for threading LEs. Sit<>stand elevated EOB<>RW with mod assist for lifting into standing, cuing to extend R LE prior to standing to maintain PWB, in standing CGA/min assist for steadying while therapist performed total assist brief change at pt request and pulling pants over hips. Cuing throughout to maintain R LE PWB during transfers with pt demonstrating good compliance. Stand pivot to w/c using RW with min assist for balance while turning and cuing for sequencing of AD management and LE stepping - therapist placed pillow in w/c to increase seat height due to increased difficulty standing from low surface. Transported to/from gym in w/c. Gait training 79ft using RW with min assist for balance and pt demonstrating preference of holding R LE off floor NWB as opposed to performing PWB, demonstrates good L LE foot clearance during hop-to gait pattern. Performed seated EOM R LE long arc quads  2x10 reps with cuing for full knee extension. Gait 68ft EOM>w/c using RW with min assist and again pt demonstrating preference to maintain R LE NWBing as opposed to using PWB with good L LE foot clearance - cuing for pushing down more through B UEs towards end of walk as pt getting fatigued and having worsening L LE foot clearance during hop-to pattern. Transported back to room in w/c. Stand pivot w/c>EOB using RW with min assist for lifting into standing and balance while turning. Sit>supine with mod assist for B LE management into bed due to pain. Asssesed HR 60bpm and SpO2 95% on RA. Pt left supine in bed with needs in reach and bed alarm on.  Therapy Documentation Precautions:  Precautions Precautions: Fall, Other (comment) Precaution Comments: Partial WB 25% RLE Required Braces or Orthoses: Cervical Brace Cervical Brace: Hard collar, For comfort Restrictions Weight Bearing Restrictions: Yes RLE Weight Bearing: Partial weight bearing RLE Partial Weight Bearing Percentage or Pounds: 25%  Pain: Reports 8/10 R hip pain increasing to 10/10 during some activities; however, reports with seated rest break pain will go down to "basically" 0/10 - provided frequent seated rest breaks for pain management.    Therapy/Group: Individual Therapy  Tawana Scale, PT, DPT 02/12/2020, 7:53 AM

## 2020-02-12 NOTE — Progress Notes (Signed)
Patient refuses to wear CPAP at hospital, has done so since admit.

## 2020-02-12 NOTE — Plan of Care (Signed)
  Problem: Consults Goal: RH GENERAL PATIENT EDUCATION Description: See Patient Education module for education specifics. Outcome: Progressing   Problem: RH BLADDER ELIMINATION Goal: RH STG MANAGE BLADDER WITH ASSISTANCE Description: STG Manage Bladder With Min Assistance Outcome: Progressing   Problem: RH SKIN INTEGRITY Goal: RH STG ABLE TO PERFORM INCISION/WOUND CARE W/ASSISTANCE Description: STG Able To Perform Incision/Wound Care With Assistance. Min Outcome: Progressing   Problem: RH SAFETY Goal: RH STG ADHERE TO SAFETY PRECAUTIONS W/ASSISTANCE/DEVICE Description: STG Adhere to Safety Precautions With Assistance/Device. MIn Outcome: Progressing   Problem: RH PAIN MANAGEMENT Goal: RH STG PAIN MANAGED AT OR BELOW PT'S PAIN GOAL Description: Less than 3 Outcome: Progressing

## 2020-02-12 NOTE — Progress Notes (Signed)
[facility] PHYSICAL MEDICINE & REHABILITATION PROGRESS NOTE   Subjective/Complaints:  ROS- denies, CP, SOB, N/V/D Objective:   VAS [place] LOWER EXTREMITY VENOUS (DVT)  Result Date: 02/10/2020  Lower Venous DVTStudy Indications: Swelling.  Comparison Study: No prior study. Performing Technologist: Maudry Mayhew MHA, RDMS, RVT, RDCS  Examination Guidelines: A complete evaluation includes B-mode imaging, spectral Doppler, color Doppler, and power Doppler as needed of all accessible portions of each vessel. Bilateral testing is considered an integral part of a complete examination. Limited examinations for reoccurring indications may be performed as noted. The reflux portion of the exam is performed with the patient in reverse Trendelenburg.  +---------+---------------+---------+-----------+----------+--------------+ RIGHT    CompressibilityPhasicitySpontaneityPropertiesThrombus Aging +---------+---------------+---------+-----------+----------+--------------+ CFV      Full           No       Yes                                 +---------+---------------+---------+-----------+----------+--------------+ SFJ      Full                                                        +---------+---------------+---------+-----------+----------+--------------+ FV Prox  Full                                                        +---------+---------------+---------+-----------+----------+--------------+ FV Mid   Full                                                        +---------+---------------+---------+-----------+----------+--------------+ FV DistalFull                                                        +---------+---------------+---------+-----------+----------+--------------+ PFV      Full                                                        +---------+---------------+---------+-----------+----------+--------------+ POP      Full           No       Yes                                  +---------+---------------+---------+-----------+----------+--------------+ PTV      Full                                                        +---------+---------------+---------+-----------+----------+--------------+  PERO     Full                                                        +---------+---------------+---------+-----------+----------+--------------+   +---------+---------------+---------+-----------+----------+--------------+ LEFT     CompressibilityPhasicitySpontaneityPropertiesThrombus Aging +---------+---------------+---------+-----------+----------+--------------+ CFV      Full           No       Yes                                 +---------+---------------+---------+-----------+----------+--------------+ SFJ      Full                                                        +---------+---------------+---------+-----------+----------+--------------+ FV Prox  Full                                                        +---------+---------------+---------+-----------+----------+--------------+ FV Mid   Full                                                        +---------+---------------+---------+-----------+----------+--------------+ FV DistalFull                                                        +---------+---------------+---------+-----------+----------+--------------+ PFV      Full                                                        +---------+---------------+---------+-----------+----------+--------------+ POP      Full           No       Yes                                 +---------+---------------+---------+-----------+----------+--------------+ PTV      Full                                                        +---------+---------------+---------+-----------+----------+--------------+ PERO     Full                                                         +---------+---------------+---------+-----------+----------+--------------+  Summary: RIGHT: - There is no evidence of deep vein thrombosis in the lower extremity.  - No cystic structure found in the popliteal fossa.  LEFT: - There is no evidence of deep vein thrombosis in the lower extremity.  - No cystic structure found in the popliteal fossa.  Pulsatile venous flow is suggestive of possible elevated right heart pressure.  *See table(s) above for measurements and observations. Electronically signed by Servando Snare MD on 02/10/2020 at 6:07:00 PM.    Final    Recent Labs    02/10/20 0552  WBC 3.6*  HGB 9.4*  HCT 28.9*  PLT 87*   Recent Labs    02/10/20 0552  NA 138  K 4.8  CL 102  CO2 27  GLUCOSE 93  BUN 33*  CREATININE 1.23  CALCIUM 9.0    Intake/Output Summary (Last 24 hours) at 02/12/2020 0746 Last data filed at 02/12/2020 0441 Gross per 24 hour  Intake 360 ml  Output 850 ml  Net -490 ml     Physical Exam: Vital Signs Blood pressure (!) 111/58, pulse (!) 59, temperature 98.3 F (36.8 C), temperature source Oral, resp. rate 18, height 5\' 7"  (1.702 m), weight 83.4 kg, SpO2 99 %.    General: No acute distress Mood and affect are appropriate Heart:Tachycardia, normal rythmn no rubs murmurs or extra sounds Lungs: Clear to auscultation, breathing unlabored, no rales or wheezes Abdomen: Positive bowel sounds, soft nontender to palpation, nondistended Extremities: No clubbing, cyanosis, or edema Skin: No evidence of breakdown, no evidence of rash Neurologic: Cranial nerves II through XII intact, motor strength is 5/5 in bilateral deltoid, bicep, tricep, grip,LEFT  hip flexor, knee extensors, ankle dorsiflexor and plantar flexor 3- HF, KE, ADF  Sensory exam reduced LT sensation below the ankles bilaterally    Musculoskeletal: Full range of motion in all 4 extremities. No joint swelling      Assessment/Plan: 1. Functional deficits secondary to Right IT hip fx  which  require 3+ hours per day of interdisciplinary therapy in a comprehensive inpatient rehab setting.  Physiatrist is providing close team supervision and 24 hour management of active medical problems listed below.  Physiatrist and rehab team continue to assess barriers to discharge/monitor patient progress toward functional and medical goals  Care Tool:  Bathing    Body parts bathed by patient: Right arm, Left arm, Chest, Abdomen, Right upper leg, Left upper leg, Face   Body parts bathed by helper: Right lower leg, Left lower leg     Bathing assist Assist Level: Moderate Assistance - Patient 50 - 74%     Upper Body Dressing/Undressing Upper body dressing   What is the patient wearing?: Pull over shirt    Upper body assist Assist Level: Supervision/Verbal cueing    Lower Body Dressing/Undressing Lower body dressing      What is the patient wearing?: Pants, Incontinence brief     Lower body assist Assist for lower body dressing: Moderate Assistance - Patient 50 - 74%     Toileting Toileting    Toileting assist Assist for toileting: Maximal Assistance - Patient 25 - 49%     Transfers Chair/bed transfer  Transfers assist     Chair/bed transfer assist level: Maximal Assistance - Patient 25 - 49%     Locomotion Ambulation   Ambulation assist      Assist level: Minimal Assistance - Patient > 75% Assistive device: Walker-rolling Max distance: 25'   Walk 10 feet activity   Assist  Assist level: Minimal Assistance - Patient > 75% Assistive device: Walker-rolling   Walk 50 feet activity   Assist Walk 50 feet with 2 turns activity did not occur: Safety/medical concerns(2/2 pain in RLE and pt fatigue)         Walk 150 feet activity   Assist Walk 150 feet activity did not occur: Safety/medical concerns         Walk 10 feet on uneven surface  activity   Assist Walk 10 feet on uneven surfaces activity did not occur: Safety/medical  concerns         Wheelchair     Assist   Type of Wheelchair: Manual    Wheelchair assist level: Supervision/Verbal cueing Max wheelchair distance: 150'    Wheelchair 50 feet with 2 turns activity    Assist        Assist Level: Supervision/Verbal cueing   Wheelchair 150 feet activity     Assist      Assist Level: Supervision/Verbal cueing   Blood pressure (!) 111/58, pulse (!) 59, temperature 98.3 F (36.8 C), temperature source Oral, resp. rate 18, height 5\' 7"  (1.702 m), weight 83.4 kg, SpO2 99 %.  Medical Problem List and Plan:  1.  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  Partial weightbearing 25% right lower extremity Rehab evals to day , team conf in am              -patient may  shower with wrapping over right foot and ankle              -ELOS/Goals: 14 to 16 days with supervision to min assist goals for ADLs and mobility  2.  Antithrombotics:  -DVT/anticoagulation: Lovenox.  Normal  vascular study              -antiplatelet therapy: N/A  3. Pain Management: Hydrocodone and Robaxin as needed  4. Mood: Zoloft 200 mg daily              -antipsychotic agents: N/A  5. Neuropsych: This patient is capable of making decisions on his own behalf.  6. Skin/Wound Care: Routine skin checks  7. Fluids/Electrolytes/Nutrition: Routine in and outs with follow-up chemistries, elevated BUN, encourage fluids   8.  Acute on chronic anemia.  Followed by Dr. [name]. continue iron supplement.  Follow-up CBC  9.  Undisplaced fracture lamina bilaterally of C6.  healed as of CT 2/17 CT CERVICAL SPINE FINDINGS  Alignment: No static subluxation. Facets are aligned. Occipital condyles are normally positioned.  Skull base and vertebrae: No acute fracture.  Soft tissues and spinal canal: No prevertebral  fluid or swelling. No visible canal hematoma.  Disc levels: No bony spinal canal stenosis. Multilevel mild vertebral body height loss, particularly at C3 and C4. Facet hypertrophy is worst at right C3-4 and left C4-5.  Upper chest: No pneumothorax, pulmonary nodule or pleural effusion.  Other: Normal visualized paraspinal cervical soft tissues.  IMPRESSION: 1. Chronic ischemic microangiopathy without acute intracranial abnormality. 2. No acute fracture or static subluxation of the cervical spine.   Electronically Signed   By: Ulyses Jarred M.D.   On: 02/04/2020 00:54  10.  Chronic diastolic congestive heart failure.  Followed by Dr. [name] monitor for any signs of fluid overload.  Patient currently euvolemic no edema home Demadex Lasix on hold resume in 1 to 2 days.  11.  History of atrial  fibrillation.  Coumadin in the past discontinued 2014 due to spontaneous subdural hematoma with craniotomy.  Toprol 12.5 mg 12.5 mg  Daily. Cardiac rate controlled Vitals:   02/11/20 1410 02/12/20 0442  BP: (!) 111/56 (!) 111/58  Pulse: (!) 59 (!) 59  Resp: 20 18  Temp: 98.4 F (36.9 C) 98.3 F (36.8 C)  SpO2: 100% 99%  Controlled 2/24- HR currently elevated ~120bpm, check EKG- may need cardiology to advise on rate control, BPs on low side   12.  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  Follow-up chemistries  13.  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  No chest pain or shortness of breath  14.  Diet-controlled diabetes mellitus..  CBGs discontinued  15.  BPH.  Flomax 0.4 mg nightly,Myrbetriq 50 mg daily.  Check PVR  16.  Hyperlipidemia.  Crestor 40 mg daily  17.  OSA/pulmonary fibrosis.  CPAP.  Patient has been refusing at times.       LOS: 3 days A FACE TO FACE EVALUATION WAS PERFORMED  Charlett Blake 02/12/2020, 7:46 AM

## 2020-02-12 NOTE — Progress Notes (Signed)
Occupational Therapy Session Note  Patient Details  Name: [name]. MRN: [id number] Date of Birth: 1941/03/27  Today's Date: 02/12/2020 OT Individual Time: [id number] OT Individual Time Calculation (min): 33 min    Short Term Goals: Week 1:  OT Short Term Goal 1 (Week 1): Pt will complete LB bathing sit to stand with use of AE PRN and overall mod assist. OT Short Term Goal 2 (Week 1): Pt will perform LB dressing sit to stand with mod assist and AE PRN. OT Short Term Goal 3 (Week 1): Pt will complete stand pivot transfer to the bedside toilet with mod assist stand pivot for 2 consecutive attempts. OT Short Term Goal 4 (Week 1): Pt will complete toilet clothing management and toilet hygiene with mod assist sit to stand.  Skilled Therapeutic Interventions/Progress Updates:    Pt completed supine to sit EOB with mod assist.  He needed mod assist for advancement of the RLE to the edge of the bed.  He then completed transfer from the EOB to the wheelchair stand pivot with mod assist.  Therapist then took him down to the ortho gym where he worked on [employer] endurance with use of the UE ergonometer.  He completed 1 set of 10 mins on level 8 Random Program.  RPMs were maintained throughout session at 28-32 per minute.  Pt with dyspnea at 2/4 post activity.  Finished session with return to the room via wheelchair and pt left sitting up per his request.  Call button and phone in reach with safety belt in place.    Therapy Documentation Precautions:  Precautions Precautions: Fall, Other (comment) Precaution Comments: Partial WB 25% RLE Required Braces or Orthoses: Cervical Brace Cervical Brace: Hard collar, For comfort Restrictions Weight Bearing Restrictions: Yes RLE Weight Bearing: Partial weight bearing RLE Partial Weight Bearing Percentage or Pounds: 25%  Pain: Pain Assessment Pain Scale: Faces Faces Pain Scale: Hurts little more Pain Type: Surgical pain Pain Location: Hip Pain  Orientation: Right Pain Descriptors / Indicators: Discomfort Pain Onset: With Activity ADL: See Care Tool Section for some details of mobility and selfcare  Therapy/Group: Individual Therapy  Jennise Both OTR/L 02/12/2020, 4:52 PM

## 2020-02-13 ENCOUNTER — Inpatient Hospital Stay (HOSPITAL_COMMUNITY): Payer: Medicare Other | Admitting: Physical Therapy

## 2020-02-13 ENCOUNTER — Inpatient Hospital Stay (HOSPITAL_COMMUNITY): Payer: Medicare Other | Admitting: Occupational Therapy

## 2020-02-13 NOTE — Progress Notes (Signed)
Physical Therapy Session Note  Patient Details  Name: [name]. MRN: [id number] Date of Birth: November 21, 1941  Today's Date: 02/13/2020 PT Individual Time: 1335-1448 PT Individual Time Calculation (min): 73 min   Short Term Goals: Week 1:  PT Short Term Goal 1 (Week 1): Patient will perform sit<>stand from elevated surface with modA PT Short Term Goal 2 (Week 1): Patient will perform stand pivot transfer with minA PT Short Term Goal 3 (Week 1): Patient will participate in stair training PT Short Term Goal 4 (Week 1): Patient will ambulate 54'  on level surfaces with minA  Skilled Therapeutic Interventions/Progress Updates:   Pt received sitting in w/c with his wife, Loletha Carrow, present and pt agreeable to therapy session. Pt attempting to lift R leg to place on ELR demonstrating significant amount of pain (more than yesterday) - RN notified for medication administration and pt unable to recall exactly when last time he took the medication - discussed with RN possible need for scheduled medication to ensure better pain management for therapy sessions. Pt's wife inquiring about pt's need for a w/c at discharge - educated her on pt's CLOF and final DME recommendation occurring closer to D/C pending pt's progress; however, anticipate pt may need it for longer distance mobility. Discussed concern regarding 3STE from garage with only handicap bars attached to walls not allowing B UE support to maintain R LE PWB - discussed likely need for handrails versus ramp pending pt's progress.  Transported to/from gym in w/c. Stand pivot w/c>EOM using RW with mod assist initially to come to stand and min assist for balance while turning - cuing for R LE positioning to maintain PWB with pt demonstrating good compliance. Sit<>stand EOM<>RW with min assist throughout for balance and pt demonstrating improving recall of proper UE positioning and R LE positioning to maintain PWB during transfers. Standing R LE hamstring  curls x15 reps with CGA for steadying. Gait training 24ft x2 (seated break between) using RW with min assist for balance via hop-to pattern - pt demonstrating improving L LE foot clearance via improved ability to push down through B UEs today - pt prefers to flex R knee and hold up in NWB while ambulating as opposed to trying PWB.  Sit>supine with min/mod assist for B LE management onto mat and significant pain with this. Performed the following supine R LE exercises: - supine hip flexor stretch to neutral 66minute - hip/knee flexion with active assist 2x5 reps - demonstrates decreased R knee ROM compared to L - ankle DF/PF x20 reps  - hip abduction/adduction active assist 2x5 reps; very limited hip abduction ROM due to hip adductor tightness Therapist providing max multimodal cuing for exhaling with exertion to assist with pain management. Supine>sit with mod assist for B LE management and trunk upright, cuing for sequencing - pt with significant pain during this task requiring seated rest prior to transfering back to w/c. Pt reports that approximately 1.73months ago he was falling 4-5x/daily and more recently was falling at least 2x a week - described falling in multiple directions and also reports that his L leg would "give out." Stand pivot to w/c using RW with min assist for lifting into stand and CGA for steadying while turning. Transported back to room in w/c - pt requesting to return to bed but no linens on bed, notified RN and she was present to assume care of pt.   Therapy Documentation Precautions:  Precautions Precautions: Fall, Other (comment) Precaution Comments: Partial WB 25%  RLE Required Braces or Orthoses: Cervical Brace Cervical Brace: Hard collar, For comfort Restrictions Weight Bearing Restrictions: No RLE Weight Bearing: Partial weight bearing RLE Partial Weight Bearing Percentage or Pounds: 25%  Pain:   Pt demonstrating significant R LE pain - RN notified and present for  medication administration - therapist providing cuing for breathing strategies for pain management and pt requiring frequent rests.    Therapy/Group: Individual Therapy  Tawana Scale, PT, DPT 02/13/2020, 1:02 PM

## 2020-02-13 NOTE — Progress Notes (Signed)
Patient refused use of cpap for the evening.

## 2020-02-13 NOTE — Progress Notes (Signed)
Occupational Therapy Session Note  Patient Details  Name: [name]. MRN: [id number] Date of Birth: 1941/10/11  Today's Date: 02/13/2020 OT Individual Time: [id number] OT Individual Time Calculation (min): 70 min    Short Term Goals: Week 1:  OT Short Term Goal 1 (Week 1): Pt will complete LB bathing sit to stand with use of AE PRN and overall mod assist. OT Short Term Goal 2 (Week 1): Pt will perform LB dressing sit to stand with mod assist and AE PRN. OT Short Term Goal 3 (Week 1): Pt will complete stand pivot transfer to the bedside toilet with mod assist stand pivot for 2 consecutive attempts. OT Short Term Goal 4 (Week 1): Pt will complete toilet clothing management and toilet hygiene with mod assist sit to stand.  Skilled Therapeutic Interventions/Progress Updates:    1:1 Pt reports his clothes are wet from shaving and wants to change. His brief is also wet. Self care retraining at shower level with right foot covered with boot still donned. Pt performed sit to stand at sink to pull down pants with min A. Min A tranfser w/c to shower bench facing outward. Pt does need cue to extend LE out when sitting to help with pain. Pt able to shower with long handled sponge with lateral leans with setup. Pt ambulated over to the toilet for BM with min A with cues for RW safety and proper hand placement.  Pt able to perform hygiene after BM with minA to ensure cleanness. Pt able to transfer with RW stand pivot into the w/c with min A with extra time. Pt dressed at the sink. PT able to don shirt with shirt. Uses reacher to thread LB clothing with extra time. Pt performed sit to stand with min A with extra time. Pt able to perform oral care in seated position with setup.  Left with right Le elevated in w/c.  Therapy Documentation Precautions:  Precautions Precautions: Fall, Other (comment) Precaution Comments: Partial WB 25% RLE Required Braces or Orthoses: Cervical Brace Cervical Brace: Hard  collar, For comfort Restrictions Weight Bearing Restrictions: No RLE Weight Bearing: Partial weight bearing RLE Partial Weight Bearing Percentage or Pounds: 25% General:   Vital Signs:  Pain: Ongoing discomfort in LE - helped with repositioning and elevation at end of session   Therapy/Group: Individual Therapy  Willeen Cass [facility] 02/13/2020, 10:36 AM

## 2020-02-13 NOTE — Progress Notes (Signed)
Occupational Therapy Session Note  Patient Details  Name: [name]. MRN: [id number] Date of Birth: [birth year]  Today's Date: 02/13/2020 OT Individual Time: [id number] OT Individual Time Calculation (min): 48 min    Short Term Goals: Week 1:  OT Short Term Goal 1 (Week 1): Pt will complete LB bathing sit to stand with use of AE PRN and overall mod assist. OT Short Term Goal 2 (Week 1): Pt will perform LB dressing sit to stand with mod assist and AE PRN. OT Short Term Goal 3 (Week 1): Pt will complete stand pivot transfer to the bedside toilet with mod assist stand pivot for 2 consecutive attempts. OT Short Term Goal 4 (Week 1): Pt will complete toilet clothing management and toilet hygiene with mod assist sit to stand.  Skilled Therapeutic Interventions/Progress Updates:    Pt completed supine to sit EOB with min assist to start session.  Min facilitation was needed for advancing the RLE over the edge of the bed and assisting it slowly to the floor.  He then completed sit to stand and stand pivot transfer from the bed to the wheelchair with use of the RW and mod assist.  He agreed to work on grooming task of shaving from the wheelchair level, and completed this with setup of supplies.  Finished session with pt in the wheelchair and call button and phone in reach and safety belt in place.    Therapy Documentation Precautions:  Precautions Precautions: Fall, Other (comment) Precaution Comments: Partial WB 25% RLE Required Braces or Orthoses: Cervical Brace Cervical Brace: Hard collar, For comfort Restrictions Weight Bearing Restrictions: No RLE Weight Bearing: Partial weight bearing RLE Partial Weight Bearing Percentage or Pounds: 25%  Pain: Pain Assessment Pain Scale: Faces Faces Pain Scale: Hurts little more Pain Type: Surgical pain Pain Location: Hip Pain Orientation: Right Pain Descriptors / Indicators: Grimacing;Discomfort Pain Onset: With Activity Pain  Intervention(s): Repositioned Multiple Pain Sites: No ADL: See Care Tool Section for some details of mobility and selfcare  Therapy/Group: Individual Therapy  Eowyn Tabone OTR/L 02/13/2020, 8:49 AM

## 2020-02-13 NOTE — Plan of Care (Signed)
  Problem: Consults Goal: RH GENERAL PATIENT EDUCATION Description: See Patient Education module for education specifics. Outcome: Progressing   Problem: RH BLADDER ELIMINATION Goal: RH STG MANAGE BLADDER WITH ASSISTANCE Description: STG Manage Bladder With Min Assistance Outcome: Progressing   Problem: RH SKIN INTEGRITY Goal: RH STG ABLE TO PERFORM INCISION/WOUND CARE W/ASSISTANCE Description: STG Able To Perform Incision/Wound Care With Assistance. Min Outcome: Progressing   Problem: RH SAFETY Goal: RH STG ADHERE TO SAFETY PRECAUTIONS W/ASSISTANCE/DEVICE Description: STG Adhere to Safety Precautions With Assistance/Device. MIn Outcome: Progressing   Problem: RH PAIN MANAGEMENT Goal: RH STG PAIN MANAGED AT OR BELOW PT'S PAIN GOAL Description: Less than 3 Outcome: Progressing

## 2020-02-13 NOTE — Progress Notes (Signed)
[facility] PHYSICAL MEDICINE & REHABILITATION PROGRESS NOTE   Subjective/Complaints:   Eating and drinking ok  ROS- denies, CP, SOB, N/V/D Objective:   No results found. No results for input(s): WBC, HGB, HCT, PLT in the last 72 hours. No results for input(s): NA, K, CL, CO2, GLUCOSE, BUN, CREATININE, CALCIUM in the last 72 hours.  Intake/Output Summary (Last 24 hours) at 02/13/2020 0814 Last data filed at 02/13/2020 0416 Gross per 24 hour  Intake 360 ml  Output 1850 ml  Net -1490 ml     Physical Exam: Vital Signs Blood pressure 124/89, pulse (!) 102, temperature 97.6 F (36.4 C), temperature source Oral, resp. rate 16, height 5\' 7"  (1.702 m), weight 83.4 kg, SpO2 98 %.     General: No acute distress Mood and affect are appropriate Heart: Regular rate and rhythm no rubs murmurs or extra sounds Lungs: Clear to auscultation, breathing unlabored, no rales or wheezes Abdomen: Positive bowel sounds, soft nontender to palpation, nondistended Extremities: No clubbing, cyanosis, or edema Skin: No evidence of breakdown, no evidence of rash Neurologic: Cranial nerves II through XII intact, motor strength is 5/5 in bilateral deltoid, bicep, tricep, grip,LEFT  hip flexor, knee extensors, ankle dorsiflexor and plantar flexor 3- HF, KE, ADF  Sensory exam reduced LT sensation below the ankles bilaterally    Musculoskeletal: reduced Right hip ROM, no evidence of knee effusion bilaterally   Assessment/Plan: 1. Functional deficits secondary to Right IT hip fx  which require 3+ hours per day of interdisciplinary therapy in a comprehensive inpatient rehab setting.  Physiatrist is providing close team supervision and 24 hour management of active medical problems listed below.  Physiatrist and rehab team continue to assess barriers to discharge/monitor patient progress toward functional and medical goals  Care Tool:  Bathing    Body parts bathed by patient: Right arm, Left arm,  Chest, Abdomen, Right upper leg, Left upper leg, Face   Body parts bathed by helper: Right lower leg, Left lower leg     Bathing assist Assist Level: Moderate Assistance - Patient 50 - 74%     Upper Body Dressing/Undressing Upper body dressing   What is the patient wearing?: Pull over shirt    Upper body assist Assist Level: Supervision/Verbal cueing    Lower Body Dressing/Undressing Lower body dressing      What is the patient wearing?: Pants, Incontinence brief     Lower body assist Assist for lower body dressing: Moderate Assistance - Patient 50 - 74%     Toileting Toileting    Toileting assist Assist for toileting: Maximal Assistance - Patient 25 - 49%     Transfers Chair/bed transfer  Transfers assist     Chair/bed transfer assist level: Moderate Assistance - Patient 50 - 74%     Locomotion Ambulation   Ambulation assist      Assist level: Minimal Assistance - Patient > 75% Assistive device: Walker-rolling Max distance: 70ft   Walk 10 feet activity   Assist     Assist level: Minimal Assistance - Patient > 75% Assistive device: Walker-rolling   Walk 50 feet activity   Assist Walk 50 feet with 2 turns activity did not occur: Safety/medical concerns(2/2 pain in RLE and pt fatigue)         Walk 150 feet activity   Assist Walk 150 feet activity did not occur: Safety/medical concerns         Walk 10 feet on uneven surface  activity   Assist Walk 10 feet  on uneven surfaces activity did not occur: Safety/medical concerns         Wheelchair     Assist   Type of Wheelchair: Manual    Wheelchair assist level: Supervision/Verbal cueing Max wheelchair distance: 150'    Wheelchair 50 feet with 2 turns activity    Assist        Assist Level: Supervision/Verbal cueing   Wheelchair 150 feet activity     Assist      Assist Level: Supervision/Verbal cueing   Blood pressure 124/89, pulse (!) 102, temperature  97.6 F (36.4 C), temperature source Oral, resp. rate 16, height 5\' 7"  (1.702 m), weight 83.4 kg, SpO2 98 %.  Medical Problem List and Plan:  1.  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  Partial weightbearing 25% right lower extremity Rehab evals to day ,              -patient may  shower with wrapping over right foot and ankle              -ELOS/Goals: 14 to 16 days with supervision to min assist goals for ADLs and mobility  2.  Antithrombotics:  -DVT/anticoagulation: Lovenox.  Normal  vascular study              -antiplatelet therapy: N/A  3. Pain Management: Hydrocodone and Robaxin as needed  4. Mood: Zoloft 200 mg daily              -antipsychotic agents: N/A  5. Neuropsych: This patient is capable of making decisions on his own behalf.  6. Skin/Wound Care: Routine skin checks-  RIght hip staples out ~3/3  7. Fluids/Electrolytes/Nutrition: Routine in and outs with follow-up chemistries, elevated BUN, encourage fluids .  D/C IV  8.  Acute on chronic anemia.  Followed by Dr. [name]. continue iron supplement.  Follow-up CBC  9.  Undisplaced fracture lamina bilaterally of C6.  healed as of CT 2/17 CT CERVICAL SPINE FINDINGS  Alignment: No static subluxation. Facets are aligned. Occipital condyles are normally positioned.  Skull base and vertebrae: No acute fracture.  Soft tissues and spinal canal: No prevertebral fluid or swelling. No visible canal hematoma.  Disc levels: No bony spinal canal stenosis. Multilevel mild vertebral body height loss, particularly at C3 and C4. Facet hypertrophy is worst at right C3-4 and left C4-5.  Upper chest: No pneumothorax, pulmonary nodule or pleural effusion.  Other: Normal visualized paraspinal cervical soft tissues.  IMPRESSION: 1. Chronic ischemic microangiopathy  without acute intracranial abnormality. 2. No acute fracture or static subluxation of the cervical spine.   Electronically Signed   By: Ulyses Jarred M.D.   On: 02/04/2020 00:54  10.  Chronic diastolic congestive heart failure.  Followed by Dr. [name] monitor for any signs of fluid overload.  Patient currently euvolemic no edema home Demadex Lasix on hold resume in 1 to 2 days.  11.  History of atrial fibrillation.  Coumadin in the past discontinued 2014 due to spontaneous subdural hematoma with craniotomy.  Toprol 12.5 mg 12.5 mg  Daily. Cardiac rate controlled Vitals:   02/12/20 2100 02/13/20 0415  BP:  124/89  Pulse: (!) 101 (!) 102  Resp: 18 16  Temp:  97.6 F (36.4 C)  SpO2: 100% 98%  Controlled 2/24- HR currently elevated ~120bpm, check EKG- may need cardiology to advise on rate control, BPs on low side  12.  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  Follow-up chemistries  13.  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  No chest pain or shortness of breath  14.  Diet-controlled diabetes mellitus..  CBGs discontinued  15.  BPH.  Flomax 0.4 mg nightly,Myrbetriq 50 mg daily.  Check PVR  16.  Hyperlipidemia.  Crestor 40 mg daily  17.  OSA/pulmonary fibrosis.  CPAP.  Patient has been refusing at times.       LOS: 4 days A FACE TO FACE EVALUATION WAS PERFORMED  Charlett Blake 02/13/2020, 8:14 AM

## 2020-02-14 ENCOUNTER — Inpatient Hospital Stay (HOSPITAL_COMMUNITY): Payer: Medicare Other | Admitting: Physical Therapy

## 2020-02-14 ENCOUNTER — Inpatient Hospital Stay (HOSPITAL_COMMUNITY): Payer: Medicare Other | Admitting: Occupational Therapy

## 2020-02-14 DIAGNOSIS — I482 Chronic atrial fibrillation, unspecified: Secondary | ICD-10-CM

## 2020-02-14 DIAGNOSIS — S72141S Displaced intertrochanteric fracture of right femur, sequela: Secondary | ICD-10-CM

## 2020-02-14 DIAGNOSIS — N182 Chronic kidney disease, stage 2 (mild): Secondary | ICD-10-CM

## 2020-02-14 NOTE — Progress Notes (Signed)
Physical Therapy Session Note  Patient Details  Name: [name]. MRN: [id number] Date of Birth: [birth year]  Today's Date: 02/14/2020 PT Individual Time: [id number] and [id number] PT Individual Time Calculation (min): 55 min and 46 min  Short Term Goals: Week 1:  PT Short Term Goal 1 (Week 1): Patient will perform sit<>stand from elevated surface with modA PT Short Term Goal 2 (Week 1): Patient will perform stand pivot transfer with minA PT Short Term Goal 3 (Week 1): Patient will participate in stair training PT Short Term Goal 4 (Week 1): Patient will ambulate 67'  on level surfaces with minA  Skilled Therapeutic Interventions/Progress Updates:    Session 1: Pt received sitting in w/c and agreeable to therapy session. Pt wearing R LE post-op shoe throughout session. Pt reports that he is receiving only 1 pain medication at this time due to his low BP. B UE w/c propulsion ~128ft to main therapy gym with supervision. Mod cuing for w/c brakes and proper set-up of transfer to mat with assist for leg rest management. Sit<>stands using RW with min assist for lifting and balance upon coming to stand throughout session from low height of w/c but CGA from mat. Gait training 51ft using RW with min assist for balance - cuing for increased L LE foot clearance during hop-to pattern - pt demonstrating good R LE WB compliance. Seated EOM performed R LE heel slides and long arc quads x10 reps each. Sit>supine with min assist for R LE management onto mat.  Performed the following supine exercises:  - R LE heel slides x10 reps - R LE hip abduction x10 reps - demonstrating increasing ROM with this exercise - glute sets 5sec hold x10 reps - R LE ankle DF/PF x20 reps Supine>L sideyling with min assist  - sidelying R LE clamshells x6 reps with pt able to move through very small excursion and this causing increased pain but tolerable Pt continues to require cuing throughout exercises for exhaling on  exertion. Supine>sitting EOM with mod assist for R LE management and trunk upright with max multimodal cuing for sequencing to increase pt independence and decrease R hip pain. Gait training 28ft using RW with min assist for balance and pt demonstrating good L LE foot clearance during hop-to gait until end of walk due to increasing B UE fatigue requiring 1x standing rest break - continues to hold R LE NWB during gait for increased safety. Transported back to room and pt requesting to return to bed and rest prior to next session. Stand pivot w/c>EOB using RW with CGA and sit>supine with min assist for B LE management into bed. Pt left supine in bed with needs in reach and bed alarm on.   Session 2: Pt received supine in bed and agreeable to therapy session. Supine>sitting EOB, HOB partially elevated and relying heavily on bedrail support for trunk upright with increased time to get EOB due to increased R LE pain - close supervision/CGA for safety. Pt defers pain intervention at this time. Donned R post-op shoe and L LE tennis shoe max assist. Stand pivot to w/c using RW with min assist for lifting into standing and CGA for steadying while turning. Transported to/from gym in w/c. Provided pt with w/c that provides improved back support and higher floor-seat height for increased OOB sitting tolerance and increased independence with sit<>stand transfers. Standing in // bars with B UE support performed 2 sets of 5reps L foot hops up onto 2" step with mod assist  for lifting and balance to determine progression towards stair navigation - demonstrates good L LE foot clearance on/off step >50% of the time but did have 2 instances where he wasn't able to clear it fully requiring increased assist for balance. Transported back to room and pt requesting to return to bed. Stand pivot w/c>EOB using RW with CGA for steadying - demonstrates good recall and carryover of proper UE placement during transfers and good compliance with  R LE PWB. Doffed shoes max assist. Sit>supine with min assist for B LE management up into the bed. Pt left supine in bed with needs in reach and bed alarm on.   Therapy Documentation Precautions:  Precautions Precautions: Fall, Other (comment) Precaution Comments: Partial WB 25% RLE Required Braces or Orthoses: Cervical Brace Cervical Brace: Hard collar, For comfort Restrictions Weight Bearing Restrictions: Yes RLE Weight Bearing: Partial weight bearing RLE Partial Weight Bearing Percentage or Pounds: 25%  Pain: Session 1: Reports R hip pain rated as high as 0000000 with certain movements but with rest pt says "I know it is there but it doesn't hurt."   Session 2: Demonstrating and reporting increased R medial thigh pain - continues to report that with rest the pain decreases.    Therapy/Group: Individual Therapy  Tawana Scale, PT, DPT 02/14/2020, 1:02 PM

## 2020-02-14 NOTE — Progress Notes (Signed)
[facility] PHYSICAL MEDICINE & REHABILITATION PROGRESS NOTE   Subjective/Complaints:  Up in bed. No new complaints. Has some intermittent pain right foot/leg. Otherwise doing well  ROS: Patient denies fever, rash, sore throat, blurred vision, nausea, vomiting, diarrhea, cough, shortness of breath or chest pain, back pain, headache, or mood change.    Objective:   No results found. No results for input(s): WBC, HGB, HCT, PLT in the last 72 hours. No results for input(s): NA, K, CL, CO2, GLUCOSE, BUN, CREATININE, CALCIUM in the last 72 hours.  Intake/Output Summary (Last 24 hours) at 02/14/2020 1228 Last data filed at 02/14/2020 0439 Gross per 24 hour  Intake 377 ml  Output 775 ml  Net -398 ml     Physical Exam: Vital Signs Blood pressure 110/75, pulse (!) 106, temperature 97.7 F (36.5 C), temperature source Oral, resp. rate 16, height 5\' 7"  (1.702 m), weight 83.4 kg, SpO2 99 %.     Constitutional: No distress . Vital signs reviewed. HEENT: EOMI, oral membranes moist Neck: supple Cardiovascular: RRR without murmur. No JVD    Respiratory/Chest: CTA Bilaterally without wheezes or rales. Normal effort    GI/Abdomen: BS +, non-tender, non-distended Ext: no clubbing, cyanosis, or edema Skin: right third toe amp cdi with sutures Neurologic: Cranial nerves II through XII intact, motor strength is 5/5 in bilateral deltoid, bicep, tricep, grip,LEFT  hip flexor, knee extensors, ankle dorsiflexor and plantar flexor 3- to 3HF, KE, ADF  Sensory exam reduced LT sensation below the ankles bilaterally in stocking glove pattern  Musculoskeletal: reduced Right hip ROM, no evidence of knee effusion bilaterally   Assessment/Plan: 1. Functional deficits secondary to Right IT hip fx  which require 3+ hours per day of interdisciplinary therapy in a comprehensive inpatient rehab setting.  Physiatrist is providing close team supervision and 24 hour management of active medical problems listed  below.  Physiatrist and rehab team continue to assess barriers to discharge/monitor patient progress toward functional and medical goals  Care Tool:  Bathing    Body parts bathed by patient: Right arm, Left arm, Chest, Abdomen, Right upper leg, Left upper leg, Face, Front perineal area, Buttocks   Body parts bathed by helper: Right lower leg, Left lower leg Body parts n/a: Right lower leg, Left lower leg   Bathing assist Assist Level: Minimal Assistance - Patient > 75%     Upper Body Dressing/Undressing Upper body dressing   What is the patient wearing?: Pull over shirt    Upper body assist Assist Level: Supervision/Verbal cueing    Lower Body Dressing/Undressing Lower body dressing      What is the patient wearing?: Pants, Incontinence brief     Lower body assist Assist for lower body dressing: Moderate Assistance - Patient 50 - 74%     Toileting Toileting    Toileting assist Assist for toileting: Maximal Assistance - Patient 25 - 49%     Transfers Chair/bed transfer  Transfers assist     Chair/bed transfer assist level: Moderate Assistance - Patient 50 - 74%     Locomotion Ambulation   Ambulation assist      Assist level: Minimal Assistance - Patient > 75% Assistive device: Walker-rolling Max distance: 27ft   Walk 10 feet activity   Assist     Assist level: Minimal Assistance - Patient > 75% Assistive device: Walker-rolling   Walk 50 feet activity   Assist Walk 50 feet with 2 turns activity did not occur: Safety/medical concerns(2/2 pain in RLE and pt fatigue)  Walk 150 feet activity   Assist Walk 150 feet activity did not occur: Safety/medical concerns         Walk 10 feet on uneven surface  activity   Assist Walk 10 feet on uneven surfaces activity did not occur: Safety/medical concerns         Wheelchair     Assist   Type of Wheelchair: Manual    Wheelchair assist level: Supervision/Verbal cueing Max  wheelchair distance: 150'    Wheelchair 50 feet with 2 turns activity    Assist        Assist Level: Supervision/Verbal cueing   Wheelchair 150 feet activity     Assist      Assist Level: Supervision/Verbal cueing   Blood pressure 110/75, pulse (!) 106, temperature 97.7 F (36.5 C), temperature source Oral, resp. rate 16, height 5\' 7"  (1.702 m), weight 83.4 kg, SpO2 99 %.  Medical Problem List and Plan:  1.  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  Partial weightbearing 25% right lower extremity Rehab evals to day ,              -patient may  shower with wrapping over right foot and ankle              -ELOS/Goals: 14 to 16 days with supervision to min assist goals for ADLs and mobility  2.  Antithrombotics:  -DVT/anticoagulation: Lovenox.  Normal  vascular study              -antiplatelet therapy: N/A  3. Pain Management: Hydrocodone and Robaxin as needed  4. Mood: Zoloft 200 mg daily              -antipsychotic agents: N/A  5. Neuropsych: This patient is capable of making decisions on his own behalf.  6. 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 Fluids/Electrolytes/Nutrition: Routine in and outs with follow-up chemistries, elevated BUN, encourage fluids .  D/C IV  8.  Acute on chronic anemia.  Followed by Dr. [name]. continue iron supplement.  Follow-up CBC  9.  Undisplaced fracture lamina bilaterally of C6.  healed as of CT 2/17 CT CERVICAL SPINE FINDINGS  Alignment: No static subluxation. Facets are aligned. Occipital condyles are normally positioned.  Skull base and vertebrae: No acute fracture.  Soft tissues and spinal canal: No prevertebral fluid or swelling. No visible  canal hematoma.  Disc levels: No bony spinal canal stenosis. Multilevel mild vertebral body height loss, particularly at C3 and C4. Facet hypertrophy is worst at right C3-4 and left C4-5.  Upper chest: No pneumothorax, pulmonary nodule or pleural effusion.  Other: Normal visualized paraspinal cervical soft tissues.  IMPRESSION: 1. Chronic ischemic microangiopathy without acute intracranial abnormality. 2. No acute fracture or static subluxation of the cervical spine.   Electronically Signed   By: Ulyses Jarred M.D.   On: 02/04/2020 00:54  10.  Chronic diastolic congestive heart failure.  Followed by Dr. [name] monitor for any signs of fluid overload.  Patient currently euvolemic no edema home Demadex Lasix on hold resume in 1 to 2 days.  Needs daily weights Filed Weights   02/09/20 1439  Weight: 83.4 kg    11.  History of atrial fibrillation.  Coumadin in the past discontinued 2014 due  to spontaneous subdural hematoma with craniotomy.  Toprol 12.5 mg 12.5 mg  Daily. Cardiac rate controlled Vitals:   02/14/20 0452 02/14/20 0756  BP:  110/75  Pulse:  (!) 106  Resp:    Temp: 97.7 F (36.5 C)   SpO2:    2/27 improved HR, still in 100's---observe, encourage adequate fluids  12.  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  Following chemistries  13.  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  No chest pain or shortness of breath  14.  Diet-controlled diabetes mellitus..  CBGs discontinued  15.  BPH.  Flomax 0.4 mg nightly,Myrbetriq 50 mg daily.  Check PVR  16.  Hyperlipidemia.  Crestor 40 mg daily  17.  OSA/pulmonary fibrosis.  CPAP.  Patient has been refusing at times.       LOS: 5 days A FACE TO FACE EVALUATION WAS PERFORMED  Meredith Staggers 02/14/2020, 12:28 PM

## 2020-02-14 NOTE — Plan of Care (Signed)
  Problem: Consults Goal: RH GENERAL PATIENT EDUCATION Description: See Patient Education module for education specifics. Outcome: Progressing   Problem: RH BLADDER ELIMINATION Goal: RH STG MANAGE BLADDER WITH ASSISTANCE Description: STG Manage Bladder With Min Assistance Outcome: Progressing   Problem: RH SKIN INTEGRITY Goal: RH STG ABLE TO PERFORM INCISION/WOUND CARE W/ASSISTANCE Description: STG Able To Perform Incision/Wound Care With Assistance. Min Outcome: Progressing   Problem: RH SAFETY Goal: RH STG ADHERE TO SAFETY PRECAUTIONS W/ASSISTANCE/DEVICE Description: STG Adhere to Safety Precautions With Assistance/Device. MIn Outcome: Progressing   Problem: RH PAIN MANAGEMENT Goal: RH STG PAIN MANAGED AT OR BELOW PT'S PAIN GOAL Description: Less than 3 Outcome: Progressing

## 2020-02-14 NOTE — Progress Notes (Signed)
Occupational Therapy Session Note  Patient Details  Name: [name]. MRN: [id number] Date of Birth: 14-Aug-1941  Today's Date: 02/14/2020 OT Individual Time: [id number] OT Individual Time Calculation (min): 71 min    Short Term Goals: Week 1:  OT Short Term Goal 1 (Week 1): Pt will complete LB bathing sit to stand with use of AE PRN and overall mod assist. OT Short Term Goal 2 (Week 1): Pt will perform LB dressing sit to stand with mod assist and AE PRN. OT Short Term Goal 3 (Week 1): Pt will complete stand pivot transfer to the bedside toilet with mod assist stand pivot for 2 consecutive attempts. OT Short Term Goal 4 (Week 1): Pt will complete toilet clothing management and toilet hygiene with mod assist sit to stand.  Skilled Therapeutic Interventions/Progress Updates:    Pt worked on bathing and dressing sit to stand at the sink this session.  He was able to complete supine to sit EOB with supervision and then completed stand pivot transfer to the wheelchair with mod assist using the RW for support.  He was then able to complete all UB selfcare with supervision.  He then completed LB bathing with mod assist sit to stand.  He did not attempt washing his feet this session secondary to taking a shower yesterday.  He used the reacher for donning his pants with mod assist to thread the right foot secondary to pt wearing post op shoe with mod assist for standing to pull them over his hips.  Finished session with pt in the working on grooming tasks at the sink.  Safety belt in place and call button and phone within reach.    Therapy Documentation Precautions:  Precautions Precautions: Fall, Other (comment) Precaution Comments: Partial WB 25% RLE Required Braces or Orthoses: Cervical Brace Cervical Brace: Hard collar, For comfort Restrictions Weight Bearing Restrictions: Yes RLE Weight Bearing: Partial weight bearing RLE Partial Weight Bearing Percentage or Pounds: 25%  Pain: Pain  Assessment Pain Scale: 0-10 Pain Score: 8  Pain Type: Surgical pain Pain Location: Hip Pain Orientation: Right Pain Descriptors / Indicators: Discomfort Pain Onset: With Activity Pain Intervention(s): RN made aware;Emotional support ADL: See Care Tool Section for some details of mobility and selfcare  Therapy/Group: Individual Therapy  Shoua Ulloa OTR/L 02/14/2020, 12:32 PM

## 2020-02-15 NOTE — Progress Notes (Signed)
[facility] PHYSICAL MEDICINE & REHABILITATION PROGRESS NOTE   Subjective/Complaints:  Had questions about BP which was elevated yesterday. Denies pain. Sleeping fairly well. Having pain in right hip still  ROS: Patient denies fever, rash, sore throat, blurred vision, nausea, vomiting, diarrhea, cough, shortness of breath or chest pain, joint or back pain, headache, or mood change.   Objective:   No results found. No results for input(s): WBC, HGB, HCT, PLT in the last 72 hours. No results for input(s): NA, K, CL, CO2, GLUCOSE, BUN, CREATININE, CALCIUM in the last 72 hours.  Intake/Output Summary (Last 24 hours) at 02/15/2020 1211 Last data filed at 02/15/2020 1129 Gross per 24 hour  Intake 120 ml  Output 1700 ml  Net -1580 ml     Physical Exam: Vital Signs Blood pressure 116/82, pulse 80, temperature 97.9 F (36.6 C), resp. rate 17, height 5\' 7"  (1.702 m), weight 83.4 kg, SpO2 100 %.     Constitutional: No distress . Vital signs reviewed. HEENT: EOMI, oral membranes moist Neck: supple Cardiovascular: RRR without murmur. No JVD    Respiratory/Chest: CTA Bilaterally without wheezes or rales. Normal effort    GI/Abdomen: BS +, non-tender, non-distended Ext: no clubbing, cyanosis, or edema Skin: right hip incisions cdi, right foot wounds with minimal to no-drainage, dressed Neurologic: Cranial nerves II through XII intact, motor strength is 5/5 in bilateral deltoid, bicep, tricep, grip,LEFT  hip flexor, knee extensors, ankle dorsiflexor and plantar flexor   3-4HF, KE, ADF, right leg limited by pain Sensory exam reduced LT sensation below the ankles bilaterally in stocking glove pattern  Musculoskeletal: reduced Right hip ROM, no evidence of knee effusion bilaterally   Assessment/Plan: 1. Functional deficits secondary to Right IT hip fx  which require 3+ hours per day of interdisciplinary therapy in a comprehensive inpatient rehab setting.  Physiatrist is providing close team  supervision and 24 hour management of active medical problems listed below.  Physiatrist and rehab team continue to assess barriers to discharge/monitor patient progress toward functional and medical goals  Care Tool:  Bathing    Body parts bathed by patient: Right arm, Left arm, Chest, Abdomen, Right upper leg, Left upper leg, Face, Front perineal area, Buttocks   Body parts bathed by helper: Right lower leg, Left lower leg Body parts n/a: Right lower leg, Left lower leg   Bathing assist Assist Level: Minimal Assistance - Patient > 75%     Upper Body Dressing/Undressing Upper body dressing   What is the patient wearing?: Pull over shirt    Upper body assist Assist Level: Supervision/Verbal cueing    Lower Body Dressing/Undressing Lower body dressing      What is the patient wearing?: Pants, Incontinence brief     Lower body assist Assist for lower body dressing: Moderate Assistance - Patient 50 - 74%     Toileting Toileting    Toileting assist Assist for toileting: Maximal Assistance - Patient 25 - 49%     Transfers Chair/bed transfer  Transfers assist     Chair/bed transfer assist level: Minimal Assistance - Patient > 75%     Locomotion Ambulation   Ambulation assist      Assist level: Minimal Assistance - Patient > 75% Assistive device: Walker-rolling Max distance: 75ft   Walk 10 feet activity   Assist     Assist level: Minimal Assistance - Patient > 75% Assistive device: Walker-rolling   Walk 50 feet activity   Assist Walk 50 feet with 2 turns activity did not  occur: Safety/medical concerns(2/2 pain in RLE and pt fatigue)  Assist level: Minimal Assistance - Patient > 75% Assistive device: Walker-rolling    Walk 150 feet activity   Assist Walk 150 feet activity did not occur: Safety/medical concerns         Walk 10 feet on uneven surface  activity   Assist Walk 10 feet on uneven surfaces activity did not occur:  Safety/medical concerns         Wheelchair     Assist   Type of Wheelchair: Manual    Wheelchair assist level: Supervision/Verbal cueing, Set up assist Max wheelchair distance: 150'    Wheelchair 50 feet with 2 turns activity    Assist        Assist Level: Set up assist, Supervision/Verbal cueing   Wheelchair 150 feet activity     Assist      Assist Level: Set up assist, Supervision/Verbal cueing   Blood pressure 116/82, pulse 80, temperature 97.9 F (36.6 C), resp. rate 17, height 5\' 7"  (1.702 m), weight 83.4 kg, SpO2 100 %.  Medical Problem List and Plan:  1.  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  Partial weightbearing 25% right lower extremity Rehab evals to day ,              -patient may  shower with wrapping over right foot and ankle              -ELOS/Goals: 14 to 16 days with supervision to min assist goals for ADLs and mobility  2.  Antithrombotics:  -DVT/anticoagulation: Lovenox.  Normal  vascular study              -antiplatelet therapy: N/A  3. Pain Management: Hydrocodone and Robaxin as needed  4. Mood: Zoloft 200 mg daily              -antipsychotic agents: N/A  5. Neuropsych: This patient is capable of making decisions on his own behalf.  6. 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 Fluids/Electrolytes/Nutrition: Routine in and outs with follow-up chemistries, elevated BUN, encourage fluids .  D/C IV  8.  Acute on chronic anemia.  Followed by Dr. [name]. continue iron supplement.  Follow-up CBC  9.  Undisplaced fracture lamina bilaterally of C6.  healed as of CT 2/17 CT CERVICAL SPINE FINDINGS  Alignment: No static subluxation. Facets are aligned.  Occipital condyles are normally positioned.  Skull base and vertebrae: No acute fracture.  Soft tissues and spinal canal: No prevertebral fluid or swelling. No visible canal hematoma.  Disc levels: No bony spinal canal stenosis. Multilevel mild vertebral body height loss, particularly at C3 and C4. Facet hypertrophy is worst at right C3-4 and left C4-5.  Upper chest: No pneumothorax, pulmonary nodule or pleural effusion.  Other: Normal visualized paraspinal cervical soft tissues.  IMPRESSION: 1. Chronic ischemic microangiopathy without acute intracranial abnormality. 2. No acute fracture or static subluxation of the cervical spine.   Electronically Signed   By: Ulyses Jarred M.D.   On: 02/04/2020 00:54  10.  Chronic diastolic congestive heart failure.  Followed by Dr. [name] monitor for any signs of fluid overload.  Patient currently euvolemic no edema home Demadex Lasix on hold resume in 1 to 2 days.  Daily  weights ordered Filed Weights   02/09/20 1439  Weight: 83.4 kg    2/28: bp elevated yesterday, back to baseline today 11.  History of atrial fibrillation.  Coumadin in the past discontinued 2014 due to spontaneous subdural hematoma with craniotomy.  Toprol 12.5 mg 12.5 mg  Daily. Cardiac rate controlled Vitals:   02/14/20 2028 02/15/20 0343  BP: 107/68 116/82  Pulse: (!) 103 80  Resp: 18 17  Temp: 98.1 F (36.7 C) 97.9 F (36.6 C)  SpO2: 99% 100%  2/28 HR in 80's to 100's---observe, encourage adequate fluids  12.  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  Following chemistries  13.  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  No chest pain or shortness of breath  14.  Diet-controlled diabetes mellitus..  CBGs discontinued  15.  BPH.  Flomax 0.4 mg nightly,Myrbetriq 50 mg daily.  Check PVR  16.  Hyperlipidemia.  Crestor 40 mg daily  17.  OSA/pulmonary fibrosis.  CPAP.  Patient has been refusing at times.       LOS: 6 days A FACE TO FACE  EVALUATION WAS PERFORMED  Meredith Staggers 02/15/2020, 12:11 PM

## 2020-02-15 NOTE — Progress Notes (Signed)
Refused CPAP tonight

## 2020-02-15 NOTE — Plan of Care (Signed)
  Problem: Consults Goal: RH GENERAL PATIENT EDUCATION Description: See Patient Education module for education specifics. Outcome: Progressing   Problem: RH BLADDER ELIMINATION Goal: RH STG MANAGE BLADDER WITH ASSISTANCE Description: STG Manage Bladder With Min Assistance Outcome: Progressing   Problem: RH SKIN INTEGRITY Goal: RH STG ABLE TO PERFORM INCISION/WOUND CARE W/ASSISTANCE Description: STG Able To Perform Incision/Wound Care With Assistance. Min Outcome: Progressing   Problem: RH SAFETY Goal: RH STG ADHERE TO SAFETY PRECAUTIONS W/ASSISTANCE/DEVICE Description: STG Adhere to Safety Precautions With Assistance/Device. MIn Outcome: Progressing   Problem: RH PAIN MANAGEMENT Goal: RH STG PAIN MANAGED AT OR BELOW PT'S PAIN GOAL Description: Less than 3 Outcome: Progressing

## 2020-02-15 NOTE — Progress Notes (Signed)
Patient refused CPAP.

## 2020-02-16 ENCOUNTER — Inpatient Hospital Stay (HOSPITAL_COMMUNITY): Payer: Medicare Other

## 2020-02-16 ENCOUNTER — Inpatient Hospital Stay (HOSPITAL_COMMUNITY): Payer: Medicare Other | Admitting: Occupational Therapy

## 2020-02-16 LAB — CREATININE, SERUM
Creatinine, Ser: 1.22 mg/dL (ref 0.61–1.24)
GFR calc Af Amer: >60 mL/min (ref >60)
GFR calc non Af Amer: 57 mL/min — ABNORMAL LOW (ref >60)

## 2020-02-16 MED ORDER — DOCUSATE SODIUM 100 MG PO CAPS
100.0000 mg | ORAL_CAPSULE | Freq: Three times a day (TID) | ORAL | Status: DC
  Administered 2020-02-16 – 2020-02-25 (×27): 100 mg via ORAL
  Filled 2020-02-16 (×27): qty 1

## 2020-02-16 NOTE — Progress Notes (Addendum)
Physical Therapy Session Note  Patient Details  Name: [name]. MRN: [id number] Date of Birth: 04-Jul-1941  Today's Date: 02/16/2020 PT Individual Time: 1520-1615 PT Individual Time Calculation (min): 55 min    Short Term Goals: Week 1:  PT Short Term Goal 1 (Week 1): Patient will perform sit<>stand from elevated surface with modA PT Short Term Goal 2 (Week 1): Patient will perform stand pivot transfer with minA PT Short Term Goal 3 (Week 1): Patient will participate in stair training PT Short Term Goal 4 (Week 1): Patient will ambulate 83'  on level surfaces with minA  Skilled Therapeutic Interventions/Progress Updates:    Patient seated in w/c upon PT arrival, agreeable to therapy session, reported severe pain. Therapist asked when he last had pain medication and he stated it was just administered. Therapist offered intermittent rest breaks throughout session and ice at the end of session for pain relief. Pt transported in w/c to rehab gym for time management and energy conservation. Pt performed RLE seated LAQ against gravity w/ 3s hold x 10. The following exercises were performed in the // bars with sit <> stands throughout for rest breaks min-modA for trunk upright, controlling descent and cues for sequencing and maintaining RLE precautions:  - RLE hip flexion x 10 w/3s hold  - RLE hip abduction x 10  - RLE hip extension x 10  - fwd ambulation x 2 laps, with step-to pattern RLE always in front  - fwd step up/down with 2" step x 10  - fwd step up/down with 4" step x 10  - fwd step up/down with 6" step x 10, with bars elevated d/t pt inc BW through RLE with inc height   With each exercise, pt required CGA-minA, cues for sequencing, posture, and technique. Throughout session, pt reported pain continually decreasing to tolerable level. Pt transported back to room in w/c d/t fatigue. Stand pivot transfer > bed with RW and modA from therapist to maintain precautions, assist wt shift,  and RW management. Sit > supine with minA for LE management. Therapist applied ice to RLE. Pt left supine in bed with needs in reach and bed alarm set.   Therapy Documentation Precautions:  Precautions Precautions: Fall, Other (comment) Precaution Comments: Partial WB 25% RLE Required Braces or Orthoses: Cervical Brace Cervical Brace: Hard collar, For comfort Restrictions Weight Bearing Restrictions: Yes RLE Weight Bearing: Partial weight bearing RLE Partial Weight Bearing Percentage or Pounds: 25%   Therapy/Group: Individual Therapy  Juliann Pulse SPT 02/16/2020, 7:52 AM

## 2020-02-16 NOTE — Plan of Care (Signed)
  Problem: Consults Goal: RH GENERAL PATIENT EDUCATION Description: See Patient Education module for education specifics. Outcome: Progressing   Problem: RH BLADDER ELIMINATION Goal: RH STG MANAGE BLADDER WITH ASSISTANCE Description: STG Manage Bladder With Min Assistance Outcome: Progressing   Problem: RH SKIN INTEGRITY Goal: RH STG ABLE TO PERFORM INCISION/WOUND CARE W/ASSISTANCE Description: STG Able To Perform Incision/Wound Care With Assistance. Min Outcome: Progressing   Problem: RH SAFETY Goal: RH STG ADHERE TO SAFETY PRECAUTIONS W/ASSISTANCE/DEVICE Description: STG Adhere to Safety Precautions With Assistance/Device. MIn Outcome: Progressing   Problem: RH PAIN MANAGEMENT Goal: RH STG PAIN MANAGED AT OR BELOW PT'S PAIN GOAL Description: Less than 3 Outcome: Progressing

## 2020-02-16 NOTE — Progress Notes (Signed)
Occupational Therapy Session Note  Patient Details  Name: [name]. MRN: [id number] Date of Birth: 1941/11/21  Today's Date: 02/16/2020 OT Individual Time: 1100-1208 OT Individual Time Calculation (min): 68 min    Short Term Goals: Week 1:  OT Short Term Goal 1 (Week 1): Pt will complete LB bathing sit to stand with use of AE PRN and overall mod assist. OT Short Term Goal 2 (Week 1): Pt will perform LB dressing sit to stand with mod assist and AE PRN. OT Short Term Goal 3 (Week 1): Pt will complete stand pivot transfer to the bedside toilet with mod assist stand pivot for 2 consecutive attempts. OT Short Term Goal 4 (Week 1): Pt will complete toilet clothing management and toilet hygiene with mod assist sit to stand.  Skilled Therapeutic Interventions/Progress Updates:    Upon entering the room, pt seated in wheelchair and cleaning up room by propelling self to obtain needed items. OT educating and demonstrating pt on B UE strengthening HEP with use of green level 3 theraband. Pt needing min cuing for proper technique and given paper handout of exercises performed this session. Pt performing 2 sets of 10 chest pulls, shoulder diagonals, shoulder elevation and 2 sets of 15 bicep curls and alternating punches. Pt taking rest breaks as needed. Pt standing from wheelchair with min A and ambulating into bathroom with RW and min A for balance. Min cuing to negotiate step up threshold into bathroom with RW while maintaining weight bearing precautions. Pt standing for toileting needs with min A for balance but able to manage clothing himself. Pt returning and standing at sink for hand hygiene with min A as well. Pt practiced backing up and turning around in tight space with RW without LOB. Pt seated in wheelchair with chair alarm belt donned and call bell within reach upon exiting the room.   Therapy Documentation Precautions:  Precautions Precautions: Fall, Other (comment) Precaution Comments:  Partial WB 25% RLE Required Braces or Orthoses: Cervical Brace Cervical Brace: Hard collar, For comfort Restrictions Weight Bearing Restrictions: Yes RLE Weight Bearing: Partial weight bearing RLE Partial Weight Bearing Percentage or Pounds: 25%   ADL: ADL Eating: Independent Where Assessed-Eating: Wheelchair Grooming: Setup Where Assessed-Grooming: Wheelchair Upper Body Bathing: Supervision/safety Where Assessed-Upper Body Bathing: Shower Lower Body Bathing: Minimal assistance Where Assessed-Lower Body Bathing: Shower Upper Body Dressing: Supervision/safety Where Assessed-Upper Body Dressing: Wheelchair Lower Body Dressing: Moderate assistance Where Assessed-Lower Body Dressing: Wheelchair Toileting: Maximal assistance Where Assessed-Toileting: Bedside Commode Toilet Transfer: Maximal assistance Toilet Transfer Method: Stand pivot Social research officer, government: Minimal assistance Social research officer, government Method: Radiographer, therapeutic: Grab bars, Transfer tub bench   Therapy/Group: Individual Therapy  Gypsy Decant 02/16/2020, 12:34 PM

## 2020-02-16 NOTE — Progress Notes (Signed)
Occupational Therapy Session Note  Patient Details  Name: [name]. MRN: [id number] Date of Birth: 31-Oct-1941  Today's Date: 02/16/2020 OT Individual Time: 4196-2229 OT Individual Time Calculation (min): 60 min    Short Term Goals: Week 1:  OT Short Term Goal 1 (Week 1): Pt will complete LB bathing sit to stand with use of AE PRN and overall mod assist. OT Short Term Goal 2 (Week 1): Pt will perform LB dressing sit to stand with mod assist and AE PRN. OT Short Term Goal 3 (Week 1): Pt will complete stand pivot transfer to the bedside toilet with mod assist stand pivot for 2 consecutive attempts. OT Short Term Goal 4 (Week 1): Pt will complete toilet clothing management and toilet hygiene with mod assist sit to stand.  Skilled Therapeutic Interventions/Progress Updates:    Pt received in bed ready for therapy. He was agreeable to a shower. Therapy focused on functional mobility with WB precautions.  Overall, pt performed extremely well.  He was able to rise to stand and pivot with RW with min A, keeping PWB on R foot.  In shower, he used lateral leans to wash bottom.  He can don clothing on and off feet from sitting and over hips with A to stabilize his balance.  No fatigue with all the activity.  Sat at sink to complete oral care. Pt in room with belt alarm on and all needs met.   Therapy Documentation Precautions:  Precautions Precautions: Fall, Other (comment) Precaution Comments: Partial WB 25% RLE Required Braces or Orthoses: Cervical Brace Cervical Brace: Hard collar, For comfort Restrictions Weight Bearing Restrictions: Yes RLE Weight Bearing: Partial weight bearing RLE Partial Weight Bearing Percentage or Pounds: 25%    Pain: Pain Assessment Pain Scale: 0-10 Pain Score: 0-No pain ADL: ADL Eating: Independent Where Assessed-Eating: Wheelchair Grooming: Setup Where Assessed-Grooming: Wheelchair Upper Body Bathing: Supervision/safety Where Assessed-Upper Body  Bathing: Shower Lower Body Bathing: Minimal assistance Where Assessed-Lower Body Bathing: Shower Upper Body Dressing: Supervision/safety Where Assessed-Upper Body Dressing: Wheelchair Lower Body Dressing: Moderate assistance Where Assessed-Lower Body Dressing: Wheelchair Toileting: Maximal assistance Where Assessed-Toileting: Bedside Commode Toilet Transfer: Maximal assistance Toilet Transfer Method: Stand pivot Social research officer, government: Minimal assistance Social research officer, government Method: Radiographer, therapeutic: Grab bars, Transfer tub bench   Therapy/Group: Individual Therapy  [place] 02/16/2020, 11:41 AM

## 2020-02-16 NOTE — Progress Notes (Signed)
[facility] PHYSICAL MEDICINE & REHABILITATION PROGRESS NOTE   Subjective/Complaints: Mr. Mcconahy slept well last night.  He is eating a banana and has finished all his breakfast.  He has started moving bowels. Discussed with nurse Minette Brine who says there were 5 days between patient's most recent BMs.   ROS: Patient denies fever, rash, sore throat, blurred vision, nausea, vomiting, diarrhea, cough, shortness of breath or chest pain, joint or back pain, headache, or mood change.   Objective:   No results found. No results for input(s): WBC, HGB, HCT, PLT in the last 72 hours. Recent Labs    02/16/20 0549  CREATININE 1.22    Intake/Output Summary (Last 24 hours) at 02/16/2020 1200 Last data filed at 02/15/2020 2335 Gross per 24 hour  Intake 360 ml  Output 675 ml  Net -315 ml     Physical Exam: Vital Signs Blood pressure 115/84, pulse 93, temperature 97.6 F (36.4 C), temperature source Oral, resp. rate 16, height 5\' 7"  (1.702 m), weight 83.4 kg, SpO2 100 %. Constitutional: No distress . Vital signs reviewed. Sitting up in bed eating banana.  HEENT: EOMI, oral membranes moist Neck: supple Cardiovascular: RRR without murmur. No JVD    Respiratory/Chest: CTA Bilaterally without wheezes or rales. Normal effort    GI/Abdomen: BS +, non-tender, non-distended Ext: no clubbing, cyanosis, or edema Skin: right hip incisions cdi, right foot wounds with minimal to no-drainage, dressed Neurologic: Cranial nerves II through XII intact, motor strength is 5/5 in bilateral deltoid, bicep, tricep, grip,LEFT  hip flexor, knee extensors, ankle dorsiflexor and plantar flexor   3-4HF, KE, ADF, right leg limited by pain Sensory exam reduced LT sensation below the ankles bilaterally in stocking glove pattern  Musculoskeletal: reduced Right hip ROM, no evidence of knee effusion bilaterally.   Assessment/Plan: 1. Functional deficits secondary to Right IT hip fx  which require 3+ hours per day of  interdisciplinary therapy in a comprehensive inpatient rehab setting.  Physiatrist is providing close team supervision and 24 hour management of active medical problems listed below.  Physiatrist and rehab team continue to assess barriers to discharge/monitor patient progress toward functional and medical goals  Care Tool:  Bathing    Body parts bathed by patient: Right arm, Left arm, Chest, Abdomen, Right upper leg, Left upper leg, Face, Front perineal area, Buttocks   Body parts bathed by helper: Left lower leg Body parts n/a: Right lower leg   Bathing assist Assist Level: Minimal Assistance - Patient > 75%     Upper Body Dressing/Undressing Upper body dressing   What is the patient wearing?: Pull over shirt    Upper body assist Assist Level: Supervision/Verbal cueing    Lower Body Dressing/Undressing Lower body dressing      What is the patient wearing?: Pants, Incontinence brief     Lower body assist Assist for lower body dressing: Moderate Assistance - Patient 50 - 74%     Toileting Toileting    Toileting assist Assist for toileting: Maximal Assistance - Patient 25 - 49%     Transfers Chair/bed transfer  Transfers assist     Chair/bed transfer assist level: Minimal Assistance - Patient > 75%     Locomotion Ambulation   Ambulation assist      Assist level: Minimal Assistance - Patient > 75% Assistive device: Walker-rolling Max distance: 16ft   Walk 10 feet activity   Assist     Assist level: Minimal Assistance - Patient > 75% Assistive device: Walker-rolling   Walk  50 feet activity   Assist Walk 50 feet with 2 turns activity did not occur: Safety/medical concerns(2/2 pain in RLE and pt fatigue)  Assist level: Minimal Assistance - Patient > 75% Assistive device: Walker-rolling    Walk 150 feet activity   Assist Walk 150 feet activity did not occur: Safety/medical concerns         Walk 10 feet on uneven surface   activity   Assist Walk 10 feet on uneven surfaces activity did not occur: Safety/medical concerns         Wheelchair     Assist   Type of Wheelchair: Manual    Wheelchair assist level: Supervision/Verbal cueing, Set up assist Max wheelchair distance: 150'    Wheelchair 50 feet with 2 turns activity    Assist        Assist Level: Set up assist, Supervision/Verbal cueing   Wheelchair 150 feet activity     Assist      Assist Level: Set up assist, Supervision/Verbal cueing   Blood pressure 115/84, pulse 93, temperature 97.6 F (36.4 C), temperature source Oral, resp. rate 16, height 5\' 7"  (1.702 m), weight 83.4 kg, SpO2 100 %.  Medical Problem List and Plan:  1.  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  Partial weightbearing 25% right lower extremity Rehab evals to day ,              -patient may  shower with wrapping over right foot and ankle             -ELOS/Goals: 14 to 16 days with supervision to min assist goals for ADLs and mobility  -Continue PT and OT  2.  Antithrombotics:  -DVT/anticoagulation: Lovenox.  Normal  vascular study              -antiplatelet therapy: N/A  3. Pain Management: Hydrocodone and Robaxin as needed. Well controlled.   4. Mood: Zoloft 200 mg daily              -antipsychotic agents: N/A  5. Neuropsych: This patient is capable of making decisions on his own behalf.  6. 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 Fluids/Electrolytes/Nutrition: Routine in and outs with follow-up chemistries, elevated BUN, encourage fluids .  D/C IV  8.  Acute on chronic anemia.  Followed by Dr. [name]. continue iron supplement.  Follow-up CBC  9.  Undisplaced fracture  lamina bilaterally of C6.  healed as of CT 2/17 CT CERVICAL SPINE FINDINGS  Alignment: No static subluxation. Facets are aligned. Occipital condyles are normally positioned.  Skull base and vertebrae: No acute fracture.  Soft tissues and spinal canal: No prevertebral fluid or swelling. No visible canal hematoma.  Disc levels: No bony spinal canal stenosis. Multilevel mild vertebral body height loss, particularly at C3 and C4. Facet hypertrophy is worst at right C3-4 and left C4-5.  Upper chest: No pneumothorax, pulmonary nodule or pleural effusion.  Other: Normal visualized paraspinal cervical soft tissues.  IMPRESSION: 1. Chronic ischemic microangiopathy without acute intracranial abnormality. 2. No acute fracture or static subluxation of the cervical spine.   Electronically Signed   By: Ulyses Jarred M.D.   On: 02/04/2020 00:54  10.  Chronic diastolic congestive heart failure.  Followed by Dr. [name] monitor  for any signs of fluid overload.  Patient currently euvolemic no edema home Demadex Lasix on hold resume in 1 to 2 days.  Daily weights ordered; not being performed; reordered 3/1.  Filed Weights   02/09/20 1439  Weight: 83.4 kg    2/28: bp elevated yesterday, back to baseline today. Stable 3/1 11.  History of atrial fibrillation.  Coumadin in the past discontinued 2014 due to spontaneous subdural hematoma with craniotomy.  Toprol 12.5 mg 12.5 mg  Daily. Cardiac rate controlled Vitals:   02/15/20 1918 02/16/20 0418  BP: 109/71 115/84  Pulse: (!) 102 93  Resp: 16 16  Temp: 98.1 F (36.7 C) 97.6 F (36.4 C)  SpO2: 98% 100%  2/28 HR in 80's to 100's---observe, encourage adequate fluids 3/1: stable.   12.  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  Following chemistries  13.  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  No chest pain or shortness of breath  14.  Diet-controlled diabetes mellitus..  CBGs discontinued  15.  BPH.  Flomax 0.4 mg  nightly,Myrbetriq 50 mg daily.  Check PVR  16.  Hyperlipidemia.  Crestor 40 mg daily  17.  OSA/pulmonary fibrosis.  CPAP.  Patient has been refusing at times.  18. Constipation: Will increase Colace to TID.        LOS: 7 days A FACE TO FACE EVALUATION WAS PERFORMED  Lexani Corona P Denetta Fei 02/16/2020, 12:00 PM

## 2020-02-16 NOTE — Progress Notes (Signed)
Patient states that he uses CPAP at home but refuses it tonight. No complications noted. Patient has refuse CPAP for multiple nights in a row. Patient clearly doesn't want to use one on this admission. Order will be removed per NIV protocol.

## 2020-02-17 ENCOUNTER — Inpatient Hospital Stay (HOSPITAL_COMMUNITY): Payer: Medicare Other

## 2020-02-17 ENCOUNTER — Inpatient Hospital Stay (HOSPITAL_COMMUNITY): Payer: Medicare Other | Admitting: Occupational Therapy

## 2020-02-17 MED ORDER — SENNOSIDES-DOCUSATE SODIUM 8.6-50 MG PO TABS
1.0000 | ORAL_TABLET | Freq: Every day | ORAL | Status: DC
  Administered 2020-02-17 – 2020-02-24 (×8): 1 via ORAL
  Filled 2020-02-17 (×8): qty 1

## 2020-02-17 NOTE — Plan of Care (Signed)
  Problem: Sit to Stand Goal: LTG:  Patient will perform sit to stand with assistance level (PT) Description: LTG:  Patient will perform sit to stand with assistance level (PT) Flowsheets (Taken 02/17/2020 1128) LTG: PT will perform sit to stand in preparation for functional mobility with assistance level: (upgraded 3/2 secondary to patient progress) Supervision/Verbal cueing   Problem: RH Bed to Chair Transfers Goal: LTG Patient will perform bed/chair transfers w/assist (PT) Description: LTG: Patient will perform bed to chair transfers with assistance (PT). Flowsheets (Taken 02/17/2020 1128) LTG: Pt will perform Bed to Chair Transfers with assistance level: (upgraded 3/2 secondary to patient progress) Supervision/Verbal cueing

## 2020-02-17 NOTE — Progress Notes (Signed)
Physical Therapy Session Note  Patient Details  Name: Vidit G Rastetter Jr. MRN: [id number] Date of Birth: 04/01/1941  Today's Date: 02/17/2020 PT Individual Time: 1100-1158 PT Individual Time Calculation (min): 58 min   Short Term Goals: Week 1:  PT Short Term Goal 1 (Week 1): Patient will perform sit<>stand from elevated surface with modA PT Short Term Goal 1 - Progress (Week 1): Met PT Short Term Goal 2 (Week 1): Patient will perform stand pivot transfer with minA PT Short Term Goal 2 - Progress (Week 1): Met PT Short Term Goal 3 (Week 1): Patient will participate in stair training PT Short Term Goal 3 - Progress (Week 1): Met PT Short Term Goal 4 (Week 1): Patient will ambulate 50'  on level surfaces with minA PT Short Term Goal 4 - Progress (Week 1): Met  Skilled Therapeutic Interventions/Progress Updates:    pt propels w/c on unit at modified independent level on unit for functional mobility training and overall endurance.  Sit <> stand to tall table to simulate countertop at home for HEP with min assist for weightshifting and balance. Instructed in standing and seated therex for RLE strengthening and ROM to improve overall mobility. Pt requires supervision to CGA for balance - RLE hip flexion standing x 10  x 2 sets - RLE hip abduction standing x 10 x 2 sets - RLE hip extension standing x 10  x 2 sets - RLE knee flex standing x 10 reps x 2 sets - seated hip adduction squeezes with ball x 10 reps with 5 sec hold x 2 sets - seated hip abduction against orange theraband x 10 reps with 5 sec hold x 2 sets - seated LAQ with 5 sec hold x 10 reps x 2 sets  Dynamic gait through obstacle course to simulate home environment mobility with RW navigating obstacles and stepping over simulated threshold with RW with CGA for balance progressing to close supervision at times. Pt performs it <> stands with RW at CGA level for balance during transition to maintain PWB status. End of session set up in  w/c, used urinal independently and all needs in reach.    Therapy Documentation Precautions:  Precautions Precautions: Fall, Other (comment) Precaution Comments: Partial WB 25% RLE Required Braces or Orthoses: Cervical Brace Cervical Brace: Hard collar, For comfort Restrictions Weight Bearing Restrictions: Yes RLE Weight Bearing: Partial weight bearing RLE Partial Weight Bearing Percentage or Pounds: 25%  Pain:  Denies pain.    Therapy/Group: Individual Therapy  ,  [place]   B. , PT, DPT, CBIS  02/17/2020, 12:21 PM  

## 2020-02-17 NOTE — Progress Notes (Signed)
Physical Therapy Weekly Progress Note  Patient Details  Name: [name]. MRN: [id number] Date of Birth: Oct 18, 1941  Beginning of progress report period: February 10, 2020 End of progress report period: February 17, 2020  Today's Date: 02/17/2020 PT Individual Time: 1100-1158 PT Individual Time Calculation (min): 58 min    Patient has met 4 of 4 short term goals.  Patient demonstrates improvement in overall strength, dec pain, and activity tolerance. He performs sit <> stands from an elevated surface with minA and stand pivot transfers with minA. He has initiated stair training performing x 4 steps with B rails and minA, with cues for sequencing and precautions. Pt ambulates up to 150' with RW and minA with cues for sequencing.  Patient continues to demonstrate the following deficits muscle weakness, decreased cardiorespiratoy endurance and decreased standing balance, decreased balance strategies and difficulty maintaining precautions and therefore will continue to benefit from skilled PT intervention to increase functional independence with mobility.  Patient progressing toward long term goals..  Continue plan of care.  PT Short Term Goals Week 1:  PT Short Term Goal 1 (Week 1): Patient will perform sit<>stand from elevated surface with modA PT Short Term Goal 1 - Progress (Week 1): Met PT Short Term Goal 2 (Week 1): Patient will perform stand pivot transfer with minA PT Short Term Goal 2 - Progress (Week 1): Met PT Short Term Goal 3 (Week 1): Patient will participate in stair training PT Short Term Goal 3 - Progress (Week 1): Met PT Short Term Goal 4 (Week 1): Patient will ambulate 75'  on level surfaces with minA PT Short Term Goal 4 - Progress (Week 1): Met Week 2:  PT Short Term Goal 1 (Week 2): STG = LTG d/t ELOS  Skilled Therapeutic Interventions/Progress Updates:    Patient supine in bed upon PT arrival, agreeable to therapy tx, denies pain at rest. Pt had c/o of dry/itchy  lower legs, therapist provided pt with lotion, pt donned lotion without assist. Therapist donned RLE post-op shoe and LLE shoe totalA. Supine > sit with CGA and additional time, flattened bed and with use of bed features. Sit > standing at RW with minA, cues for technique/hand positioning and sequencing. Stand pivot transfer to w/c with minA for safety and RW management, cues for sequencing. Pt performed w/c mobility 150' to rehab gym with supervision. Sit > standing in front of stairs with minA. Pt ascended/descended x 2 steps + x 4 steps + 2 steps with the following technique concurrently with "up with the good, down with the bad":  - 1st trial: using top B rails with modA from therapist to maintain precautions, pt unable to maintain precautions with this technique - 2nd trial: using middle B rails (lower to inc ability of utilizing UE for support) and minA from therapist, pt able to maintain precautions  - 3rd trial: ascending backwards using walker minA + 2 with one therapist supporting pt from behind to maintain precautions and the second therapist in front of RW to keep stable, safe and effective if pt has 2 people available.   Pt then ambulated 150' back to room with RW and minA from therapist to maintain precautions, initial verbal cueing and demonstration required for sequencing/technique of large RLE step first > small LLE step-to > continued. Stand > sit in w/c with minA for RW management and cues for sequencing. Therapist applied ice to RLE. Pt left in w/c with needs in reach and chair alarm set.  Therapy Documentation Precautions:  Precautions Precautions: Fall, Other (comment) Precaution Comments: Partial WB 25% RLE Required Braces or Orthoses: Cervical Brace Cervical Brace: Hard collar, For comfort Restrictions Weight Bearing Restrictions: Yes RLE Weight Bearing: Partial weight bearing RLE Partial Weight Bearing Percentage or Pounds: 25%   Therapy/Group: Individual  Therapy  Elmyra Ricks Neave Lenger SPT 02/17/2020, 7:30 AM

## 2020-02-17 NOTE — Progress Notes (Signed)
Patients heart rate charted 102, turned to a yellow mews. Patient is assessed and stable; no acute change noted.

## 2020-02-17 NOTE — Progress Notes (Signed)
Occupational Therapy Session Note  Patient Details  Name: [name]. MRN: [id number] Date of Birth: [birth year]  Today's Date: 02/17/2020 OT Individual Time: [id number] Individual Time Calculation (min): 72 min    Short Term Goals: Week 1:  OT Short Term Goal 1 (Week 1): Pt will complete LB bathing sit to stand with use of AE PRN and overall mod assist. OT Short Term Goal 2 (Week 1): Pt will perform LB dressing sit to stand with mod assist and AE PRN. OT Short Term Goal 3 (Week 1): Pt will complete stand pivot transfer to the bedside toilet with mod assist stand pivot for 2 consecutive attempts. OT Short Term Goal 4 (Week 1): Pt will complete toilet clothing management and toilet hygiene with mod assist sit to stand.  Skilled Therapeutic Interventions/Progress Updates:    Upon entering the room, pt seated in wheelchair with no c/o pain and agreeable to OT intervention. Pt does report fatigue throughout session. Pt propelled wheelchair 150' with supervision to ADL apartment. Pt ambulating on carpeted surface while maintaining PWB with min guard and use of RW. Pt demonstrated side stepping to [facility] and then sit >supine with supervision for safety. Pt reports bed height of 26 inches to be similar to home environment. Pt then ambulating and transferring onto recliner chair but needing mod lifting assist to stand from much lower surface. OT demonstrated transfer backwards over simulated shower threshold based on pt's report of home shower. Pt returning demonstrations x 3 with min cuing and min A for balance. Pt verbalized plans to place a grab bar for safety. Pt returning to wheelchair and maneuvering in tight space to turn around. Pt propelled wheelchair back to room in same manner as above and performed min A stand pivot transfer with use of bed rail. PT supine in bed with call bell and all needed items within reach. Bed alarm activated.   Therapy Documentation Precautions:   Precautions Precautions: Fall, Other (comment) Precaution Comments: Partial WB 25% RLE Required Braces or Orthoses: Cervical Brace Cervical Brace: Hard collar, For comfort Restrictions Weight Bearing Restrictions: Yes RLE Weight Bearing: Partial weight bearing RLE Partial Weight Bearing Percentage or Pounds: 25% Vital Signs: Therapy Vitals Temp: 97.6 F (36.4 C) Temp Source: Oral Pulse Rate: 65 Resp: 18 BP: (!) 121/57 Patient Position (if appropriate): Sitting Oxygen Therapy SpO2: 100 % O2 Device: Room Air Pain:   ADL: ADL Eating: Independent Where Assessed-Eating: Wheelchair Grooming: Setup Where Assessed-Grooming: Clinical biochemist Bathing: Supervision/safety Where Assessed-Upper Body Bathing: Shower Lower Body Bathing: Minimal assistance Where Assessed-Lower Body Bathing: Shower Upper Body Dressing: Supervision/safety Where Assessed-Upper Body Dressing: Wheelchair Lower Body Dressing: Moderate assistance Where Assessed-Lower Body Dressing: Wheelchair Toileting: Maximal assistance Where Assessed-Toileting: Bedside Commode Toilet Transfer: Maximal assistance Toilet Transfer Method: Stand pivot Social research officer, government: Minimal assistance Social research officer, government Method: Radiographer, therapeutic: Grab bars, Transfer tub bench   Therapy/Group: Individual Therapy  Gypsy Decant 02/17/2020, 4:26 PM

## 2020-02-17 NOTE — Progress Notes (Signed)
[name]   Subjective/Complaints: [name] slept well last night. He continues to deny pain at rest, but has hip pain when abducting the right leg.  He has not been having regular BM; normal for him are daily BM.    ROS: Patient denies fever, rash, sore throat, blurred vision, nausea, vomiting, diarrhea, cough, shortness of breath or chest pain, joint or back pain, headache, or mood change.   Objective:   No results found. No results for input(s): WBC, HGB, HCT, PLT in the last 72 hours. Recent Labs    02/16/20 0549  CREATININE 1.22    Intake/Output Summary (Last 24 hours) at 02/17/2020 1139 Last data filed at 02/17/2020 0402 Gross per 24 hour  Intake --  Output 375 ml  Net -375 ml     Physical Exam: Vital Signs Blood pressure 117/65, pulse 74, temperature 97.6 F (36.4 C), temperature source Oral, resp. rate 18, height 5\' 7"  (1.702 m), weight 83.4 kg, SpO2 99 %. Constitutional: No distress . Vital signs reviewed. Sitting up in bed comfortably.  HEENT: EOMI, oral membranes moist Neck: supple Cardiovascular: RRR without murmur. No JVD    Respiratory/Chest: CTA Bilaterally without wheezes or rales. Normal effort    GI/Abdomen: BS +, non-tender, non-distended Ext: no clubbing, cyanosis, or edema Skin: right hip incisions cdi, right foot wounds with minimal to no-drainage, dressed Neurologic: Cranial nerves II through XII intact, motor strength is 5/5 in bilateral deltoid, bicep, tricep, grip,LEFT  hip flexor, knee extensors, ankle dorsiflexor and plantar flexor   3-4HF, KE, ADF, right leg limited by pain Sensory exam reduced LT sensation below the ankles bilaterally in stocking glove pattern  Musculoskeletal: reduced Right hip ROM, no evidence of knee effusion bilaterally.  Psych: Pleasant disposition.   Assessment/Plan: 1. Functional deficits secondary to Right IT hip fx  which require 3+ hours per day of interdisciplinary  therapy in a comprehensive inpatient rehab setting.  Physiatrist is providing close team supervision and 24 hour management of active medical problems listed below.  Physiatrist and rehab team continue to assess barriers to discharge/monitor patient progress toward functional and medical goals  Care Tool:  Bathing    Body parts bathed by patient: Right arm, Left arm, Chest, Abdomen, Right upper leg, Left upper leg, Face, Front perineal area, Buttocks   Body parts bathed by helper: Left lower leg Body parts n/a: Right lower leg   Bathing assist Assist Level: Minimal Assistance - Patient > 75%     Upper Body Dressing/Undressing Upper body dressing   What is the patient wearing?: Pull over shirt    Upper body assist Assist Level: Supervision/Verbal cueing    Lower Body Dressing/Undressing Lower body dressing      What is the patient wearing?: Pants, Incontinence brief     Lower body assist Assist for lower body dressing: Moderate Assistance - Patient 50 - 74%     Toileting Toileting    Toileting assist Assist for toileting: Minimal Assistance - Patient > 75%(standing)     Transfers Chair/bed transfer  Transfers assist     Chair/bed transfer assist level: Moderate Assistance - Patient 50 - 74%     Locomotion Ambulation   Ambulation assist      Assist level: Minimal Assistance - Patient > 75% Assistive device: Parallel bars Max distance: 5'   Walk 10 feet activity   Assist     Assist level: Minimal Assistance - Patient > 75% Assistive device: Walker-rolling  Walk 50 feet activity   Assist Walk 50 feet with 2 turns activity did not occur: Safety/medical concerns(2/2 pain in RLE and pt fatigue)  Assist level: Minimal Assistance - Patient > 75% Assistive device: Walker-rolling    Walk 150 feet activity   Assist Walk 150 feet activity did not occur: Safety/medical concerns         Walk 10 feet on uneven surface  activity   Assist  Walk 10 feet on uneven surfaces activity did not occur: Safety/medical concerns         Wheelchair     Assist   Type of Wheelchair: Manual    Wheelchair assist level: Supervision/Verbal cueing, Set up assist Max wheelchair distance: 150'    Wheelchair 50 feet with 2 turns activity    Assist        Assist Level: Set up assist, Supervision/Verbal cueing   Wheelchair 150 feet activity     Assist      Assist Level: Set up assist, Supervision/Verbal cueing   Blood pressure 117/65, pulse 74, temperature 97.6 F (36.4 C), temperature source Oral, resp. rate 18, height 5\' 7"  (1.702 m), weight 83.4 kg, SpO2 99 %.  Medical Problem List and Plan:  1.  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  Partial weightbearing 25% right lower extremity Rehab evals to day ,              -patient may  shower with wrapping over right foot and ankle             -ELOS/Goals: 14 to 16 days with supervision to min assist goals for ADLs and mobility  -Continue PT and OT  2.  Antithrombotics:  -DVT/anticoagulation: Lovenox.  Normal  vascular study              -antiplatelet therapy: N/A  3. Pain Management: Hydrocodone and Robaxin as needed. Well controlled.   4. Mood: Zoloft 200 mg daily              -antipsychotic agents: N/A  5. Neuropsych: This patient is capable of making decisions on his own behalf.  6. 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 Fluids/Electrolytes/Nutrition: Routine in and outs with follow-up chemistries, elevated BUN, encourage fluids .  D/C IV  8.  Acute on chronic anemia.  Followed by [name]. continue iron supplement.  Follow-up CBC  9.  Undisplaced fracture lamina bilaterally of  C6.  healed as of CT 2/17 CT CERVICAL SPINE FINDINGS  Alignment: No static subluxation. Facets are aligned. Occipital condyles are normally positioned.  Skull base and vertebrae: No acute fracture.  Soft tissues and spinal canal: No prevertebral fluid or swelling. No visible canal hematoma.  Disc levels: No bony spinal canal stenosis. Multilevel mild vertebral body height loss, particularly at C3 and C4. Facet hypertrophy is worst at right C3-4 and left C4-5.  Upper chest: No pneumothorax, pulmonary nodule or pleural effusion.  Other: Normal visualized paraspinal cervical soft tissues.  IMPRESSION: 1. Chronic ischemic microangiopathy without acute intracranial abnormality. 2. No acute fracture or static subluxation of the cervical spine.   Electronically Signed   By: Ulyses Jarred M.D.   On: 02/04/2020 00:54  10.  Chronic diastolic congestive heart failure.  Followed by Dr. [name]  monitor for any signs of fluid overload.  Patient currently euvolemic no edema home Demadex Lasix on hold resume in 1 to 2 days.  Daily weights ordered; not being performed; reordered 3/1.  Filed Weights   02/09/20 1439  Weight: 83.4 kg    2/28: bp elevated yesterday, back to baseline today. Stable 3/1, 3/2.  11.  History of atrial fibrillation.  Coumadin in the past discontinued 2014 due to spontaneous subdural hematoma with craniotomy.  Toprol 12.5 mg 12.5 mg  Daily. Cardiac rate controlled Vitals:   02/16/20 1929 02/17/20 0527  BP: 110/73 117/65  Pulse: 100 74  Resp: 18   Temp: 97.9 F (36.6 C) 97.6 F (36.4 C)  SpO2: 98% 99%  2/28 HR in 80's to 100's---observe, encourage adequate fluids 3/1, 3/2: stable.   12.  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  Following chemistries  13.  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  No chest pain or shortness of breath  14.  Diet-controlled diabetes mellitus..  CBGs discontinued  15.  BPH.  Flomax 0.4 mg nightly,Myrbetriq 50 mg daily.   Check PVR  16.  Hyperlipidemia.  Crestor 40 mg daily  17.  OSA/pulmonary fibrosis.  CPAP.  Patient has been refusing at times.  18. Constipation: Will increase Colace to TID. 3/2: Will add Senna HS.     LOS: 8 days A FACE TO FACE EVALUATION WAS PERFORMED  Clide Deutscher Kohner Orlick 02/17/2020, 11:39 AM

## 2020-02-18 ENCOUNTER — Inpatient Hospital Stay (HOSPITAL_COMMUNITY): Payer: Medicare Other | Admitting: Occupational Therapy

## 2020-02-18 ENCOUNTER — Inpatient Hospital Stay (HOSPITAL_COMMUNITY): Payer: Medicare Other

## 2020-02-18 ENCOUNTER — Inpatient Hospital Stay (HOSPITAL_COMMUNITY): Payer: Medicare Other | Admitting: Physical Therapy

## 2020-02-18 NOTE — Progress Notes (Signed)
[place] PHYSICAL MEDICINE & REHABILITATION PROGRESS NOTE   Subjective/Complaints: [name] slept well last night. He continues to deny pain at rest, but has hip pain when abducting the right leg.  Now having regular BM.    ROS: Patient denies fever, rash, sore throat, blurred vision, nausea, vomiting, diarrhea, cough, shortness of breath or chest pain, joint or back pain, headache, or mood change.   Objective:   No results found. No results for input(s): WBC, HGB, HCT, PLT in the last 72 hours. Recent Labs    02/16/20 0549  CREATININE 1.22    Intake/Output Summary (Last 24 hours) at 02/18/2020 1046 Last data filed at 02/18/2020 0444 Gross per 24 hour  Intake 222 ml  Output 1175 ml  Net -953 ml     Physical Exam: Vital Signs Blood pressure (!) 114/54, pulse 60, temperature (!) 97.5 F (36.4 C), temperature source Oral, resp. rate 16, height 5\' 7"  (1.702 m), weight 78.1 kg, SpO2 100 %. Constitutional: No distress . Vital signs reviewed. Lying on bed in therapy gym comfortably.  HEENT: EOMI, oral membranes moist Neck: supple Cardiovascular: RRR without murmur. No JVD    Respiratory/Chest: CTA Bilaterally without wheezes or rales. Normal effort    GI/Abdomen: BS +, non-tender, non-distended Ext: no clubbing, cyanosis, or edema Skin: right hip incisions cdi, right foot wounds with minimal to no-drainage, dressed Neurologic: Cranial nerves II through XII intact, motor strength is 5/5 in bilateral deltoid, bicep, tricep, grip,LEFT  hip flexor, knee extensors, ankle dorsiflexor and plantar flexor   3-4HF, KE, ADF, right leg limited by pain Sensory exam reduced LT sensation below the ankles bilaterally in stocking glove pattern  Musculoskeletal: reduced Right hip ROM, no evidence of knee effusion bilaterally.  Psych: Pleasant disposition.   Assessment/Plan: 1. Functional deficits secondary to Right IT hip fx  which require 3+ hours per day of interdisciplinary therapy in a  comprehensive inpatient rehab setting.  Physiatrist is providing close team supervision and 24 hour management of active medical problems listed below.  Physiatrist and rehab team continue to assess barriers to discharge/monitor patient progress toward functional and medical goals  Care Tool:  Bathing    Body parts bathed by patient: Right arm, Left arm, Chest, Abdomen, Right upper leg, Left upper leg, Face, Front perineal area, Buttocks, Left lower leg   Body parts bathed by helper: Left lower leg Body parts n/a: Right lower leg(covered with bag secondary to wound)   Bathing assist Assist Level: Minimal Assistance - Patient > 75%     Upper Body Dressing/Undressing Upper body dressing   What is the patient wearing?: Pull over shirt    Upper body assist Assist Level: Supervision/Verbal cueing    Lower Body Dressing/Undressing Lower body dressing      What is the patient wearing?: Pants, Incontinence brief     Lower body assist Assist for lower body dressing: Minimal Assistance - Patient > 75%     Toileting Toileting    Toileting assist Assist for toileting: Minimal Assistance - Patient > 75%(standing)     Transfers Chair/bed transfer  Transfers assist     Chair/bed transfer assist level: Minimal Assistance - Patient > 75%     Locomotion Ambulation   Ambulation assist      Assist level: Minimal Assistance - Patient > 75% Assistive device: Walker-rolling Max distance: 150'   Walk 10 feet activity   Assist     Assist level: Minimal Assistance - Patient > 75% Assistive device: Walker-rolling  Walk 50 feet activity   Assist Walk 50 feet with 2 turns activity did not occur: Safety/medical concerns(2/2 pain in RLE and pt fatigue)  Assist level: Minimal Assistance - Patient > 75% Assistive device: Walker-rolling    Walk 150 feet activity   Assist Walk 150 feet activity did not occur: Safety/medical concerns  Assist level: Minimal Assistance  - Patient > 75% Assistive device: Walker-rolling    Walk 10 feet on uneven surface  activity   Assist Walk 10 feet on uneven surfaces activity did not occur: Safety/medical concerns         Wheelchair     Assist   Type of Wheelchair: Manual    Wheelchair assist level: Supervision/Verbal cueing Max wheelchair distance: 150'    Wheelchair 50 feet with 2 turns activity    Assist        Assist Level: Supervision/Verbal cueing   Wheelchair 150 feet activity     Assist      Assist Level: Supervision/Verbal cueing   Blood pressure (!) 114/54, pulse 60, temperature (!) 97.5 F (36.4 C), temperature source Oral, resp. rate 16, height 5\' 7"  (1.702 m), weight 78.1 kg, SpO2 100 %.  Medical Problem List and Plan:  1.  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  Partial weightbearing 25% right lower extremity Rehab evals to day ,              -patient may  shower with wrapping over right foot and ankle             -ELOS/Goals: 14 to 16 days with supervision to min assist goals for ADLs and mobility  -Continue PT and OT  -team conference today.  2.  Antithrombotics:  -DVT/anticoagulation: Lovenox.  Normal  vascular study              -antiplatelet therapy: N/A  3. Pain Management: Hydrocodone and Robaxin as needed. Well controlled.   4. Mood: Zoloft 200 mg daily              -antipsychotic agents: N/A  5. Neuropsych: This patient is capable of making decisions on his own behalf.  6. Skin/Wound Care: Routine skin checks-  RIght hip staples out ~3/3; will discuss with nursing that staples can be removed today.   -right hip and toe wounds cdi  -may want to try foam dressing to left face/cancer site 7. Fluids/Electrolytes/Nutrition: Routine in and outs with follow-up chemistries, elevated BUN, encourage fluids  .  D/C IV  8.  Acute on chronic anemia.  Followed by Dr. [name]. continue iron supplement.  Follow-up CBC  9.  Undisplaced fracture lamina bilaterally of C6.  healed as of CT 2/17 CT CERVICAL SPINE FINDINGS  Alignment: No static subluxation. Facets are aligned. Occipital condyles are normally positioned.  Skull base and vertebrae: No acute fracture.  Soft tissues and spinal canal: No prevertebral fluid or swelling. No visible canal hematoma.  Disc levels: No bony spinal canal stenosis. Multilevel mild vertebral body height loss, particularly at C3 and C4. Facet hypertrophy is worst at right C3-4 and left C4-5.  Upper chest: No pneumothorax, pulmonary nodule or pleural effusion.  Other: Normal visualized paraspinal cervical soft tissues.  IMPRESSION: 1. Chronic ischemic microangiopathy without acute intracranial abnormality. 2. No acute fracture or static subluxation of the cervical spine.   Electronically Signed   By: Ulyses Jarred M.D.   On:  02/04/2020 00:54  10.  Chronic diastolic congestive heart failure.  Followed by Dr. [name] monitor for any signs of fluid overload.  Patient currently euvolemic no edema home Demadex Lasix on hold resume in 1 to 2 days.  Daily weights ordered; not being performed; reordered 3/1.  Filed Weights   02/09/20 1439 02/18/20 0442  Weight: 83.4 kg 78.1 kg    2/28: bp elevated yesterday, back to baseline today. Stable 3/1, 3/2, 3/3.  11.  History of atrial fibrillation.  Coumadin in the past discontinued 2014 due to spontaneous subdural hematoma with craniotomy.  Toprol 12.5 mg 12.5 mg  Daily. Cardiac rate controlled Vitals:   02/17/20 1352 02/18/20 0442  BP: (!) 121/57 (!) 114/54  Pulse: 65 60  Resp: 18 16  Temp: 97.6 F (36.4 C) (!) 97.5 F (36.4 C)  SpO2: 100% 100%  2/28 HR in 80's to 100's---observe, encourage adequate fluids 3/1, 3/2, 3/3: stable.   12.  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  Following  chemistries  13.  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  No chest pain or shortness of breath  14.  Diet-controlled diabetes mellitus..  CBGs discontinued  15.  BPH.  Flomax 0.4 mg nightly,Myrbetriq 50 mg daily.  Check PVR  16.  Hyperlipidemia.  Crestor 40 mg daily  17.  OSA/pulmonary fibrosis.  CPAP.  Patient has been refusing at times.  18. Constipation: Will increase Colace to TID. 3/2: Will add Senna HS. Well controlled with this regimen.     LOS: 9 days A FACE TO FACE EVALUATION WAS PERFORMED  [name] 02/18/2020, 10:46 AM

## 2020-02-18 NOTE — Progress Notes (Signed)
Team Conference Report to Patient/Family  Team Conference discussion was reviewed with the patient and caregiver, including goals for min assist, any changes in plan of care and target discharge date.  Patient and caregiver express understanding and are in agreement.  The patient has a target discharge date of 02/25/20. Wife confirmed family education on 02/23/20 @ 0900 and they will not have rails installed before discharge on entry steps however there are grab bars he can hold onto to get into the home. Reports she comfortable with projected assistance required for discharge.  Dorien Chihuahua B 02/18/2020, 2:06 PM

## 2020-02-18 NOTE — Progress Notes (Signed)
Physical Therapy Session Note  Patient Details  Name: [name]. MRN: [id number] Date of Birth: Jul 11, 1941  Today's Date: 02/18/2020 PT Individual Time: 1435-1520 and 11:20-12:00 PT Individual Time Calculation (min): 45 min and 81mn  Short Term Goals: Week 1:  PT Short Term Goal 1 (Week 1): Patient will perform sit<>stand from elevated surface with modA PT Short Term Goal 1 - Progress (Week 1): Met PT Short Term Goal 2 (Week 1): Patient will perform stand pivot transfer with minA PT Short Term Goal 2 - Progress (Week 1): Met PT Short Term Goal 3 (Week 1): Patient will participate in stair training PT Short Term Goal 3 - Progress (Week 1): Met PT Short Term Goal 4 (Week 1): Patient will ambulate 534  on level surfaces with minA PT Short Term Goal 4 - Progress (Week 1): Met Week 2:  PT Short Term Goal 1 (Week 2): STG = LTG d/t ELOS Week 3:     Skilled Therapeutic Interventions/Progress Updates:  AM SESSION:   PAIN denies pain this am  Pt initially OOB in wc and agreeable to treatment.  wc propulsion 1243fmod I including 2 turns, backing, and operation of legrests.   STS from wc w/cues for RLE placement forward to decrease wbing w/transition. Gait 12074f/RW and cues for RLE placement to minimize wbing, discussed importance of maintaining wbing to allow for healing and risk of nonhealing if wbing exceeded, also discussed risk factor of peripheral neuropathy/vascular issues.  Pt demonstrated increased attention to wbing/positioining of RLE w/gait and transfers following ed. Turn/sit to mat w/cga and cues.  LAQ's x 10 Hamstring curls w/towel under foot x 15  STS from mat and SPT to wc w/RW w/cues for foot placement,cga. wc propulsion [id number] UEs mod I including turning and backing to bed/set up for transfer w/cues.  SPT wc to bed w/RW and cga.   Sit to supine w/supervision. Pt left supine w/rails up x 3, alarm set, bed in lowest position, and needs in reach.   PM  SESSION: PAIN Denies pain at rest, 3/10 w/AROM therex, performed to tolerance and rest provided.  Pt initially supine and agreeable to treatment w/focus on ROM and gait training w/wbing monitoring shoe. Pt performed the following therex in supine to increase RLE ROM, decrease overall pain. Heel slides x 12 Clamshells bilat x 15 w/progressivly increasing ROM tolerated. Hip abd/add x 10 Supine to sit on edge of bed using rail w/supervision. Donned L shoe w/set up, R wbing shoe donned by therapist.  Pt educated on use of shoe and alarm as feedback when exceeding 25%ROM.  Pt STS and SPT w/RW to wc w/cga. Pt transported to gym for gait training.   Gait 80 ft w/cga w/step "1/2" to gait pattern and cues to offload via increased use of UE's.  Pt able to maintain wbing 90% of time for 70ft4fn exceeding wbing despite cues to offload w/UEs, cues for posture, and cues for decreased step length.  Returned to sitting w/cga.   STS from wc to RW w/cga, SPT wc to bed w/CGA, stand to sit w/cga.  Sit to supine w/min assist for LEs. Pt left supine w/rails up x 3, alarm set, bed in lowest position, and needs in reach.   Maintains wbing well w/transitions and transfers.  Fatigues w/increased gait distance.  Would benefit from continue training w/wbing shoe to improve compliance.  Did not progress to stairs due to fatigue w/gait, increased difficulty w/wbing.   Therapy Documentation Precautions:  Precautions Precautions: Fall, Other (comment) Precaution Comments: Partial WB 25% RLE Required Braces or Orthoses: Cervical Brace Cervical Brace: Hard collar, For comfort Restrictions Weight Bearing Restrictions: Yes RLE Weight Bearing: Partial weight bearing RLE Partial Weight Bearing Percentage or Pounds: 25%    Therapy/Group: Individual Therapy  Callie Fielding, [place] 02/18/2020, 3:30 PM

## 2020-02-18 NOTE — Progress Notes (Signed)
Staples removed from right hip

## 2020-02-18 NOTE — Progress Notes (Signed)
Occupational Therapy Weekly Progress Note  Patient Details  Name: [name]. MRN: [id number] Date of Birth: 1940/12/19  Beginning of progress report period: February 10, 2020 End of progress report period: February 18, 2020  Today's Date: 02/18/2020 OT Individual Time: 5456-2563 OT Individual Time Calculation (min): 74 min    Patient has met 4 of 4 short term goals.  Pt is making steady progress with OT at this time.  He is able to complete UB selfcare with setup and LB selfcare with min assist overall sit to stand.  AE was used for LB dressing in order to donn a brief and shorts over his LEs. He can complete toileting tasks with min assist as well as toilet transfers.  Walk-in shower transfers are also at a min assist level to the tub bench.  He is able to appropriately follow his weightbearing restrictions over the RLE.  Feel he is on target to meet min guard assist goals with planned discharge on 3/10.  Recommend continued OT until discharge to reach this level.   Patient continues to demonstrate the following deficits: muscle weakness and decreased standing balance and decreased balance strategies and therefore will continue to benefit from skilled OT intervention to enhance overall performance with BADL and Reduce care partner burden.  Patient progressing toward long term goals..  Continue plan of care.  OT Short Term Goals Week 2:  OT Short Term Goal 1 (Week 2): Continue working on established LTGs set at contact guard assist.  Skilled Therapeutic Interventions/Progress Updates:    Pt worked on bathing and dressing during session.  He was able to ambulate into the bathroom with use of the RW for support with min assist, maintaining TDWBing restrictions.  He was able to complete all bathing sit to stand at min assist using the grab bars for support.  He transferred out to the wheelchair for dressing tasks at min assist.  He completed donning brief and shorts with min assist using the  reacher to thread them over both of his LEs.  He was able to donn his pullover shirt with setup assistance.  He completed grooming tasks of brushing his hair and his teeth with modified independent level.  Pt left in the wheelchair with call button and phone in reach with safety belt in place.    Therapy Documentation Precautions:  Precautions Precautions: Fall, Other (comment) Precaution Comments: Partial WB 25% RLE Required Braces or Orthoses: Cervical Brace Cervical Brace: Hard collar, For comfort Restrictions Weight Bearing Restrictions: Yes RLE Weight Bearing: Partial weight bearing RLE Partial Weight Bearing Percentage or Pounds: 25%  Pain: Pain Assessment Pain Scale: 0-10 Pain Score: 2  Pain Type: Surgical pain Pain Location: Hip Pain Orientation: Right Pain Descriptors / Indicators: Discomfort Pain Frequency: Intermittent Pain Onset: With Activity Pain Intervention(s): Repositioned ADL: See Care Tool Section for some details of mobility for selfcare tasks  Therapy/Group: Individual Therapy  [name] OTR/L 02/18/2020, 10:39 AM

## 2020-02-18 NOTE — Patient Care Conference (Signed)
Inpatient RehabilitationTeam Conference and Plan of Care Update Date: 02/18/2020   Time: 10:35 AM    Patient Name: [name].      Medical Record Number: [id number]  Date of Birth: 1941/11/03 Sex: Male         Room/Bed: 4W18C/4W18C-01 Payor Info: Payor: MEDICARE / Plan: MEDICARE PART A AND B / Product Type: *No Product type* /    Admit Date/Time:  02/09/2020  2:22 PM  Primary Diagnosis:  Closed intertrochanteric fracture of right hip, initial encounter [facility])  Patient Active Problem List   Diagnosis Date Noted  . OSA on CPAP 02/11/2020  . Persistent hypersomnia 02/11/2020  . Cognitive decline 02/11/2020  . Hip fracture ([place]) 02/09/2020  . Osteomyelitis of second toe of right foot ([place])   . Closed right hip fracture, initial encounter ([place]) 02/04/2020  . Closed intertrochanteric fracture of right hip, initial encounter ([place]) 02/04/2020  . Closed nondisplaced intertrochanteric fracture of right femur ([place])   . Symptomatic bradycardia 09/24/2019  . Chronic diastolic heart failure ([place]) 06/03/2019  . Coronary artery disease involving native heart without angina pectoris 06/03/2019  . Debility 05/26/2019  . 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 Toe infection 05/17/2019  . DOE (dyspnea on exertion) 05/15/2019  . Severe tricuspid regurgitation 05/15/2019  . Chronic right-sided CHF (congestive heart failure) ([place]) 05/15/2019  . Cellulitis of fourth toe of left foot   . Excessive daytime sleepiness 02/11/2019  . 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[place]) 02/11/2019  . Iron deficiency anemia 02/04/2019  . Poor memory 12/16/2018  . Deficiency anemia 11/19/2018  . Other fatigue 11/19/2018  . History of elevated PSA 11/19/2018  . Prostate cancer screening 11/19/2018  . Prostate CA ([place]) 11/19/2018  . Pancytopenia, acquired ([place]) 08/13/2018  . Recurrent falls 08/13/2018  . 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 Peripheral neuropathy  07/31/2018  . 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 Anemia, chronic disease 01/28/2018  . History of skin cancer 07/27/2017  . Fatty liver disease, nonalcoholic [id number]  . Thrombocytopenia ([place]) 11/27/2016  . Lung nodule 04/23/2014  . 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 Long term (current) use of anticoagulants 11/22/2011  . Abdominal bruit 03/07/2011  . Pacemaker-[employer]   . Hx of CABG   . Hyperlipidemia type II   . OSA (obstructive sleep apnea)   . PACEMAKER, PERMANENT 08/17/2010  . HYPERLIPIDEMIA 08/16/2010  . Obesity 08/16/2010  . Essential hypertension 08/16/2010  . Atrial fibrillation ([place]) 08/16/2010  . IRREGULAR HEART RATE 08/16/2010  . Sleep apnea 08/16/2010  . BENIGN PROSTATIC HYPERTROPHY, HX OF 08/16/2010    Expected Discharge Date: Expected Discharge Date: 02/25/20  Team Members Present: Physician leading conference: Dr. Leeroy Cha Care Coodinator Present: Nestor Lewandowsky, RN, BSN, CRRN;Genie Shonique Pelphrey, RN, MSN Nurse Present: Isla Pence, RN PT Present: Michaelene Song, PT OT Present: Clyda Greener, OT SLP Present: Jettie Booze, CF-SLP PPS Coordinator present : Gunnar Fusi, SLP     Current Status/Progress Goal Weekly Team Focus  Bowel/Bladder   Patient is continent and can be inconitnent at times when urinating and continent of bowel, LBM: 02/17/20  Maintain bowel and bladder continence  assist with toileting needs q shift and PRN   Swallow/Nutrition/ Hydration             ADL's   supervision for UB selfcare, min assist for LB selfcare sit to stand, min assist for functional transfers  min guard assist overall  selfcare retrianing, transfer training,  therapeutic activities, balance retraining, AE/DME education, pt education   Mobility   modA bed mobility, minA supine>sit, minA sit<>stand, min-modA stand pivot transfer, minA gait 150' with RW, 4 steps with B rails and min-modA  supervision-CGA overall   standing balance, gait, stair training, NMR, transfers   Communication             Safety/Cognition/ Behavioral Observations            Pain   patient c/o moderate pain in R foot and R hip surgical incision  pain level <4  q shift and PRN pain assessment   Skin   patient has surgical wound in R hip, R foot and chronic skin issue in L cheek  prevent skin from breakdown and infection  q shift and PRN skin assessment during dressing change    Rehab Goals Patient on target to meet rehab goals: Yes *See Care Plan and progress notes for long and short-term goals.     Barriers to Discharge  Current Status/Progress Possible Resolutions Date Resolved   Nursing                  PT                    OT                  SLP                SW Decreased caregiver support;Inaccessible home environment;Other (comments);Weight;Weight bearing restrictions 3 step entry to one level home Plan to discharge home with wife          Discharge Planning/Teaching Needs:  Discharge home with wife  Family Education set up   Team Discussion: Pain controlled, no issues.  RN cont, incision with staples R hip, amp toe R side.  OT S UB B/D, min A LB B/D, use reacher, needs assist with cast shoe, transfers min A, goals CGA on target.  PT amb to room, 25% WB, 3 steps with no railings, fam ed next week.  Per MD can remove staples today.  Wife will look into getting rails put in at home.   Revisions to Treatment Plan: N/A     Medical Summary Current Status: Medical issues included constipation, uncontrolled BP and HR-- all issues currently well controlled. Weekly Focus/Goal: Continue intensive therapies and nursing care, staples removed today.  Barriers to Discharge: Medical stability   Possible Resolutions to Barriers: Conitnue intensive therapies, nursing care, medical management, caregiver training.   Continued Need for Acute Rehabilitation Level of Care: The patient requires daily medical management  by a physician with specialized training in physical medicine and rehabilitation for the following reasons: Direction of a multidisciplinary physical rehabilitation program to maximize functional independence : Yes Medical management of patient stability for increased activity during participation in an intensive rehabilitation regime.: Yes Analysis of laboratory values and/or radiology reports with any subsequent need for medication adjustment and/or medical intervention. : Yes   I attest that I was present, lead the team conference, and concur with the assessment and plan of the team.   Retta Diones 02/18/2020, 7:21 PM   Team conference was held via web/ teleconference due to [place] - 19

## 2020-02-18 NOTE — Progress Notes (Signed)
Physical Therapy Session Note  Patient Details  Name: [name]. MRN: [id number] Date of Birth: 22-Aug-1941  Today's Date: 02/18/2020 PT Individual Time: 0825-0908 PT Individual Time Calculation (min): 43 min   Short Term Goals: Week 2:  PT Short Term Goal 1 (Week 2): STG = LTG d/t ELOS  Skilled Therapeutic Interventions/Progress Updates: Pt presented in bed eating breaksfast and agreeable to therapy. Pt missed 9mn skilled PT due to completing meal. Performed supine to sit with close S and use of bed features. PTA donned shoes total A for time management. Performed stand pivot transfer to w/c with x 2 attempts for STS from lowered bed with increased effort noted. Pt propelled w/c to rehab gym supervision and was managed leg rests with supervision. Performed stand pivot transfer hight low mat CGA with improved technique on STS. Transferred to supine supervision on mat and participated in bridges from red physioball 2 x 10, heel slides 2 x 10. Transferred to sitting supervision and performed LAQ with 3-5 sec hold 2 x 10. Pt ambulated back approx 1054fwith RW and CGA. Pt transported remaining distance due to time management and pt left with belt alarm on, call bell within reach and needs met.      Therapy Documentation Precautions:  Precautions Precautions: Fall, Other (comment) Precaution Comments: Partial WB 25% RLE Required Braces or Orthoses: Cervical Brace Cervical Brace: Hard collar, For comfort Restrictions Weight Bearing Restrictions: Yes RLE Weight Bearing: Partial weight bearing RLE Partial Weight Bearing Percentage or Pounds: 25% General: PT Amount of Missed Time (min): 17 Minutes Vital Signs:  Pain: Pain Assessment Pain Scale: 0-10 Pain Score: 2  Pain Type: Surgical pain Pain Location: Hip Pain Orientation: Right Pain Descriptors / Indicators: Discomfort Pain Frequency: Intermittent Pain Onset: With Activity Pain Intervention(s):  Repositioned   Therapy/Group: Individual Therapy  Veera Stapleton  Jishnu Jenniges, PTA  02/18/2020, 10:28 AM

## 2020-02-19 ENCOUNTER — Inpatient Hospital Stay (HOSPITAL_COMMUNITY): Payer: Medicare Other | Admitting: Occupational Therapy

## 2020-02-19 ENCOUNTER — Telehealth: Payer: Self-pay | Admitting: Neurology

## 2020-02-19 ENCOUNTER — Inpatient Hospital Stay (HOSPITAL_COMMUNITY): Payer: Medicare Other | Admitting: Physical Therapy

## 2020-02-19 NOTE — Telephone Encounter (Signed)
Called patient to discuss sleep study results. No answer at this time. LVM for the patient to call back.   

## 2020-02-19 NOTE — Progress Notes (Signed)
[facility] PHYSICAL MEDICINE & REHABILITATION PROGRESS NOTE   Subjective/Complaints: [name] slept well last night and denies pain. He is now moving his bowels regularly.  RN is cleaning toe amputation site and he is working with OT.     ROS: Patient denies fever, rash, sore throat, blurred vision, nausea, vomiting, diarrhea, cough, shortness of breath or chest pain, joint or back pain, headache, or mood change.   Objective:   No results found. No results for input(s): WBC, HGB, HCT, PLT in the last 72 hours. No results for input(s): NA, K, CL, CO2, GLUCOSE, BUN, CREATININE, CALCIUM in the last 72 hours.  Intake/Output Summary (Last 24 hours) at 02/19/2020 0905 Last data filed at 02/19/2020 0410 Gross per 24 hour  Intake 360 ml  Output 1125 ml  Net -765 ml     Physical Exam: Vital Signs Blood pressure 125/81, pulse 99, temperature 97.8 F (36.6 C), temperature source Oral, resp. rate 14, height 5\' 7"  (1.702 m), weight 77.6 kg, SpO2 100 %. Constitutional: No distress . Vital signs reviewed. Sitting up in chair.  HEENT: EOMI, oral membranes moist Neck: supple Cardiovascular: RRR without murmur. No JVD    Respiratory/Chest: CTA Bilaterally without wheezes or rales. Normal effort    GI/Abdomen: BS +, non-tender, non-distended Ext: no clubbing, cyanosis, or edema Skin: right hip incisions cdi, right foot wounds with minimal to no-drainage, healing well.  Neurologic: Cranial nerves II through XII intact, motor strength is 5/5 in bilateral deltoid, bicep, tricep, grip,LEFT  hip flexor, knee extensors, ankle dorsiflexor and plantar flexor   3-4HF, KE, ADF, right leg limited by pain Sensory exam reduced LT sensation below the ankles bilaterally in stocking glove pattern  Musculoskeletal: reduced Right hip ROM, no evidence of knee effusion bilaterally.  Psych: Pleasant disposition.   Assessment/Plan: 1. Functional deficits secondary to Right IT hip fx  which require 3+ hours per day  of interdisciplinary therapy in a comprehensive inpatient rehab setting.  Physiatrist is providing close team supervision and 24 hour management of active medical problems listed below.  Physiatrist and rehab team continue to assess barriers to discharge/monitor patient progress toward functional and medical goals  Care Tool:  Bathing    Body parts bathed by patient: Right arm, Left arm, Chest, Abdomen, Right upper leg, Left upper leg, Face, Front perineal area, Buttocks, Left lower leg   Body parts bathed by helper: Left lower leg Body parts n/a: Right lower leg(covered with bag secondary to wound)   Bathing assist Assist Level: Minimal Assistance - Patient > 75%     Upper Body Dressing/Undressing Upper body dressing   What is the patient wearing?: Pull over shirt    Upper body assist Assist Level: Supervision/Verbal cueing    Lower Body Dressing/Undressing Lower body dressing      What is the patient wearing?: Pants, Incontinence brief     Lower body assist Assist for lower body dressing: Minimal Assistance - Patient > 75%     Toileting Toileting    Toileting assist Assist for toileting: Minimal Assistance - Patient > 75%(standing)     Transfers Chair/bed transfer  Transfers assist     Chair/bed transfer assist level: Contact Guard/Touching assist     Locomotion Ambulation   Ambulation assist      Assist level: Contact Guard/Touching assist Assistive device: Walker-rolling Max distance: 125   Walk 10 feet activity   Assist     Assist level: Contact Guard/Touching assist Assistive device: Walker-rolling   Walk 50 feet  activity   Assist Walk 50 feet with 2 turns activity did not occur: Safety/medical concerns(2/2 pain in RLE and pt fatigue)  Assist level: Contact Guard/Touching assist Assistive device: Walker-rolling    Walk 150 feet activity   Assist Walk 150 feet activity did not occur: Safety/medical concerns  Assist level:  Minimal Assistance - Patient > 75% Assistive device: Walker-rolling    Walk 10 feet on uneven surface  activity   Assist Walk 10 feet on uneven surfaces activity did not occur: Safety/medical concerns         Wheelchair     Assist   Type of Wheelchair: Manual    Wheelchair assist level: Supervision/Verbal cueing Max wheelchair distance: 150'    Wheelchair 50 feet with 2 turns activity    Assist        Assist Level: Supervision/Verbal cueing   Wheelchair 150 feet activity     Assist      Assist Level: Supervision/Verbal cueing   Blood pressure 125/81, pulse 99, temperature 97.8 F (36.6 C), temperature source Oral, resp. rate 14, height 5\' 7"  (1.702 m), weight 77.6 kg, SpO2 100 %.  Medical Problem List and Plan:  1.  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  Partial weightbearing 25% right lower extremity Rehab evals to day ,              -patient may  shower with wrapping over right foot and ankle             -ELOS/Goals: 14 to 16 days with supervision to min assist goals for ADLs and mobility  -Continue PT and OT  2.  Antithrombotics:  -DVT/anticoagulation: Lovenox.  Normal  vascular study              -antiplatelet therapy: N/A  3. Pain Management: Hydrocodone and Robaxin as needed. Well controlled.   4. Mood: Zoloft 200 mg daily              -antipsychotic agents: N/A  5. Neuropsych: This patient is capable of making decisions on his own behalf.  6. Skin/Wound Care: Routine skin checks-  RIght hip staples out ~3/3; will discuss with nursing that staples can be removed today.   3/4: staples removed yesterday.   -right hip and toe wounds cdi, healing well.   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 Fluids/Electrolytes/Nutrition: Routine in and outs with follow-up  chemistries, elevated BUN, encourage fluids .  D/C IV  8.  Acute on chronic anemia.  Followed by Dr. [name]. continue iron supplement.  Follow-up CBC  9.  Undisplaced fracture lamina bilaterally of C6.  healed as of CT 2/17 CT CERVICAL SPINE FINDINGS  Alignment: No static subluxation. Facets are aligned. Occipital condyles are normally positioned.  Skull base and vertebrae: No acute fracture.  Soft tissues and spinal canal: No prevertebral fluid or swelling. No visible canal hematoma.  Disc levels: No bony spinal canal stenosis. Multilevel mild vertebral body height loss, particularly at C3 and C4. Facet hypertrophy is worst at right C3-4 and left C4-5.  Upper chest: No pneumothorax, pulmonary nodule or pleural effusion.  Other: Normal visualized paraspinal cervical soft tissues.  IMPRESSION: 1. Chronic ischemic microangiopathy without acute intracranial abnormality. 2. No acute fracture or static subluxation of the cervical spine.   Electronically Signed   By: Ulyses Jarred M.D.   On: 02/04/2020 00:54  10.  Chronic diastolic congestive heart failure.  Followed by Dr. [name] monitor for any signs of fluid overload.  Patient currently euvolemic no edema home Demadex Lasix on hold resume in 1 to 2 days.  Daily weights ordered; not being performed; reordered 3/1.   3/4: weight decreasing.  Filed Weights   02/09/20 1439 02/18/20 0442 02/19/20 0410  Weight: 83.4 kg 78.1 kg 77.6 kg    2/28: bp elevated yesterday, back to baseline today. Stable 3/1, 3/2, 3/3, 3/4 11.  History of atrial fibrillation.  Coumadin in the past discontinued 2014 due to spontaneous subdural hematoma with craniotomy.  Toprol 12.5 mg 12.5 mg  Daily. Cardiac rate controlled Vitals:   02/18/20 2022 02/19/20 0410  BP: 126/79 125/81  Pulse: 100 99  Resp: 16 14  Temp: 97.9 F (36.6 C) 97.8 F (36.6 C)  SpO2: 99% 100%  2/28 HR in 80's to 100's---observe, encourage adequate fluids 3/1, 3/2,  3/3, 3/4: stable.   12.  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  Following chemistries  13.  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  No chest pain or shortness of breath  14.  Diet-controlled diabetes mellitus..  CBGs discontinued  15.  BPH.  Flomax 0.4 mg nightly,Myrbetriq 50 mg daily.  Check PVR  16.  Hyperlipidemia.  Crestor 40 mg daily  17.  OSA/pulmonary fibrosis.  CPAP.  Patient has been refusing at times.  18. Constipation: Will increase Colace to TID. 3/2: Will add Senna HS. Well controlled with this regimen.     LOS: 10 days A FACE TO FACE EVALUATION WAS PERFORMED  [name] 02/19/2020, 9:05 AM

## 2020-02-19 NOTE — Progress Notes (Signed)
Occupational Therapy Session Note  Patient Details  Name: [name]. MRN: [id number] Date of Birth: February 19, 1941  Today's Date: 02/19/2020 OT Individual Time: 0800-0904 OT Individual Time Calculation (min): 64 min    Short Term Goals: Week 2:  OT Short Term Goal 1 (Week 2): Continue working on established LTGs set at contact guard assist.  Skilled Therapeutic Interventions/Progress Updates:    Session 1: [id number])  Pt completed transfer from supine to sit EOB with supervision to start session.  Nursing in to administer medications as well as re-dressing the right foot.  He completed scoot pivot to the wheelchair with min guard assist and then rolled over to the sink for grooming tasks.  He was able to complete oraly hygiene, and washing his face with setup assist.  He then worked on shaving with setup as well and increased time secondary to using safety razor.  He was able to doff and donn his pullover shirt during task but needed max assist for donning the right cast shoe at end of session.  He was able to donn the left slip on shoe with setup assist only.  Finished session with pt sitting at bedside with call button and phone in reach and safety belt in place.    Session 2: [id number])  Pain reported at 2/10 in the RLE with activity during session.  He was taken down to the therapy gym with use of the wheelchair where he worked on simulated walk-in shower transfers and toilet transfers.  He was able to complete toilet transfer to the 3:1 over the toilet with min assist using the RW for support.  He needed min assist as well for managing of clothing sit to stand as well as for toilet hygiene.  He then completed posterior transfer into the simulated walk-in shower with use of the RW and shower seat at min assist level.  Pt has a built in seat in his shower at home, but based on positioning of the seat in the back part of the shower he currently will not be able to use it as he cannot get the  walker into the shower in order to keep his weight off of the RLE.  Discussed use of another shower seat put in sideways so that it is facing the door, and then he can step over backwards and sit down.  He's not sure that he will do this vs taking a sponge bath.  Finished session with transfer back to the room via wheelchair and pt left sitting up with call button and phone in reach.   Therapy Documentation Precautions:  Precautions Precautions: Fall, Other (comment) Precaution Comments: Partial WB 25% RLE Required Braces or Orthoses: Cervical Brace Cervical Brace: Hard collar, For comfort Restrictions Weight Bearing Restrictions: Yes RLE Weight Bearing: Partial weight bearing RLE Partial Weight Bearing Percentage or Pounds: 25%   Pain:  Pt with pain 2/10 in the right hip, no medications needed  ADL: See Care Tool Section for some details of  Therapy/Group: Individual Therapy  Antaniya Venuti OTR/L 02/19/2020, 12:20 PM

## 2020-02-19 NOTE — Telephone Encounter (Signed)
-----   Message from Larey Seat, MD sent at 02/11/2020  6:04 PM EST ----- DIAGNOSIS  1. Obstructive Sleep Apnea well controlled on CPAP in a setting  comparable to his home machine.  2. Paced EKG.  3. High sleep efficiency  4. Normal REM sleep latency.     PLANS/RECOMMENDATIONS: valid study for MSLT to follow.   A Tox screen  will be obtained at [facility].   A follow up appointment will be scheduled in the Sleep Clinic at  [facility].  Please call [phone] with  any questions.

## 2020-02-19 NOTE — Progress Notes (Signed)
Physical Therapy Session Note  Patient Details  Name: [name]. MRN: [id number] Date of Birth: 1941/08/20  Today's Date: 02/19/2020 PT Individual Time: 0950-1100 PT Individual Time Calculation (min): 70 min   Short Term Goals: Week 1:  PT Short Term Goal 1 (Week 1): Patient will perform sit<>stand from elevated surface with modA PT Short Term Goal 1 - Progress (Week 1): Met PT Short Term Goal 2 (Week 1): Patient will perform stand pivot transfer with minA PT Short Term Goal 2 - Progress (Week 1): Met PT Short Term Goal 3 (Week 1): Patient will participate in stair training PT Short Term Goal 3 - Progress (Week 1): Met PT Short Term Goal 4 (Week 1): Patient will ambulate 109'  on level surfaces with minA PT Short Term Goal 4 - Progress (Week 1): Met Week 2:  PT Short Term Goal 1 (Week 2): STG = LTG d/t ELOS  Skilled Therapeutic Interventions/Progress Updates:   Pt received sitting in WC and agreeable to PT  WC mobility through hall to and from rehab gym x 140f without cues or assist. Dynamic WC mobility to weave through 5 cones at 570fapart x 2 forwards and x 2 reverse with supevision assist min cues for safety, turning technique and awareness of obstacles when moving backwards. PT strongly encouraged pt to utilize [facility] much as possible upon d/c to allow adequate healing to LLE and reduce fall risk. Pt verbalized understanding.   Gait training with RW and WB shoe to maintain 25% WB on the RLE x 2539fCGA from PT throughout and only min cues for proper sequencing and RW to maintain step-to gait pattern.   Stair negotiation trainingSeated UE therex:  on 3 inch step 1 step and then 2 steps after prolonged rest break. Pt also performed ascent/descent for 6 inch step x 1. UE support on RW throughout  And min assist for safety. Moderate multimodal cues for gait pattern, UE placement and AD management while maintaining PWB. With  Auditory feedback from WB shoe, pt able to maintain proper  WB 95% of the time.    Mid row 2 x 12 with level 2 tband  Press ups 2 x 5  Chest press 2 x 12 with 5lb bar weight.   Overhead press x 12 with 4lb bard weight  Cues for full ROM and decreased speed of eccentric movement to maximize strengthening aspects of movements.   Patient returned to room and [facility] reach and all needs met.          Therapy Documentation Precautions:  Precautions Precautions: Fall, Other (comment) Precaution Comments: Partial WB 25% RLE Required Braces or Orthoses: Cervical Brace Cervical Brace: Hard collar, For comfort Restrictions Weight Bearing Restrictions: Yes RLE Weight Bearing: Partial weight bearing RLE Partial Weight Bearing Percentage or Pounds: 25% Pain: Denies at rest    Therapy/Group: Individual Therapy  AusLorie Phenix4/2021, 12:11 PM

## 2020-02-20 ENCOUNTER — Inpatient Hospital Stay (HOSPITAL_COMMUNITY): Payer: Medicare Other | Admitting: Occupational Therapy

## 2020-02-20 ENCOUNTER — Inpatient Hospital Stay (HOSPITAL_COMMUNITY): Payer: Medicare Other

## 2020-02-20 ENCOUNTER — Inpatient Hospital Stay (HOSPITAL_COMMUNITY): Payer: Medicare Other | Admitting: Physical Therapy

## 2020-02-20 NOTE — Progress Notes (Signed)
Physical Therapy Session Note  Patient Details  Name: [name]. MRN: [id number] Date of Birth: 1941/11/21  Today's Date: 02/20/2020 PT Individual Time: 1000-1100 PT Individual Time Calculation (min): 60 min   Short Term Goals: Week 1:  PT Short Term Goal 1 (Week 1): Patient will perform sit<>stand from elevated surface with modA PT Short Term Goal 1 - Progress (Week 1): Met PT Short Term Goal 2 (Week 1): Patient will perform stand pivot transfer with minA PT Short Term Goal 2 - Progress (Week 1): Met PT Short Term Goal 3 (Week 1): Patient will participate in stair training PT Short Term Goal 3 - Progress (Week 1): Met PT Short Term Goal 4 (Week 1): Patient will ambulate 67'  on level surfaces with minA PT Short Term Goal 4 - Progress (Week 1): Met Week 2:  PT Short Term Goal 1 (Week 2): STG = LTG d/t ELOS Week 3:     Skilled Therapeutic Interventions/Progress Updates:    PAIN denies pain this am during session. Pt initially OOB in wc and agreeable to treatment session with focus on  functional mobility, training to maintain wbing status, and general strengthening,  wc propulsion 121f mod I including mult turns. Gait 369fincluding turning w/RW w/wbing shoe RLE,, ability to maintain 25% wbing initially good but decreased w/fatigue.  Standing therex in parallel bars: RLE marching x 15 RHip abd/add x 15 R knee flexion x 15  Gait in parallel bars 27f61f 3 w/no difficulty maintining wbing status.  Stairs: Reviewed home set up which includes only a rail on L, vertical grab bar at top of stairs.  Pt ascended using single rail on L via leading w/RLE placed in abd/ER and leaning to L rail w/forearm to offload.  Pt able to ascend 4 steps and adhere to wbing status.    Descending- Pt unable to maintain PWB on 2/4 steps using single rail on R as at home/leading w/R and offoading using R rail.  Will need to continue working on descending/ options for this   Discussed possibility  of having friend/neighbor install second rail at  garage entrance to improve safety/ability to maintain wbing status.  Pt agreed to discuss w/family.  Therapy Documentation Precautions:  Precautions Precautions: Fall, Other (comment) Precaution Comments: Partial WB 25% RLE Required Braces or Orthoses: Cervical Brace Cervical Brace: Hard collar, For comfort Restrictions Weight Bearing Restrictions: Yes RLE Weight Bearing: Partial weight bearing RLE Partial Weight Bearing Percentage or Pounds: 25%    Therapy/Group: Individual Therapy  BarCallie FieldingT Linneus5/2021, 12:56 PM

## 2020-02-20 NOTE — Progress Notes (Signed)
Occupational Therapy Session Note  Patient Details  Name: [name]. MRN: [id number] Date of Birth: June 12, 1941  Today's Date: 02/20/2020 OT Individual Time: 1401-1502 OT Individual Time Calculation (min): 61 min    Short Term Goals: Week 2:  OT Short Term Goal 1 (Week 2): Continue working on established LTGs set at contact guard assist.  Skilled Therapeutic Interventions/Progress Updates:    Pt rolled his wheelchair down to the dayroom to start session with supervision.  Had him work on sit to stand transitions and standing balance adhering to TDWBing precautions on the RLE at 25%.  He did exhibit one LOB to the right in standing while holding the Wii controller in the left hand.  Min guard for most sit to stand transitions however with use of his UEs to push off of the arm rests each time.  Once complete, he rolled himself back to the room where he then transferred to the bed with min guard assist.  He removed his shoes with use of the reacher and transitioned to supine with supervision.  Finished session with BUE exercises using the medium resistance green therapy band.  He was able to complete 1 set 50 for horizontal shoulder abduction, 1 set of 10 for shoulder flexion, and 1 set each of 20 reps for elbow extension.  Pt was left in the bed with call button and phone in reach and bed alarm in place.    Therapy Documentation Precautions:  Precautions Precautions: Fall, Other (comment) Precaution Comments: Partial WB 25% RLE Required Braces or Orthoses: Cervical Brace Cervical Brace: Hard collar, For comfort Restrictions Weight Bearing Restrictions: Yes RLE Weight Bearing: Partial weight bearing RLE Partial Weight Bearing Percentage or Pounds: 25%    Pain: Pain Assessment Pain Scale: Faces Pain Score: 0-No pain ADL: See Care Tool Section for some details of mobility  Therapy/Group: Individual Therapy  Allyne Hebert OTR/L 02/20/2020, 3:41 PM

## 2020-02-20 NOTE — Progress Notes (Addendum)
Physical Therapy Session Note  Patient Details  Name: [name]. MRN: [id number] Date of Birth: 18-Dec-1941  Today's Date: 02/20/2020 PT Individual Time: 1304-1330 PT Individual Time Calculation (min): 26 min   Short Term Goals: Week 2:  PT Short Term Goal 1 (Week 2): STG = LTG d/t ELOS  Skilled Therapeutic Interventions/Progress Updates:  Pt received in w/c & agreeable to tx. Pt does not wear c-collor for comfort during session. Pt propels w/c room<>gym with BUE & distant supervision. Therapist dons/doffs RLE weight bearing shoe for gait training. Sit<>stand with RW & CGA and pt ambulates 73 ft with RW, CGA, & weight bearing shoe with shoe only alarming 3x. Therapist provides cuing for shorter step length BLE to allow pt increased support from BUE on RW but pt demonstrating proper step to pattern. Back in room pt performs BLE hip adduction pillow squeezes for strengthening with instructional cuing for technique. Pt left in w/c with chair alarm donned & all needs in reach.  Therapy Documentation Precautions:  Precautions Precautions: Fall, Other (comment) Precaution Comments: Partial WB 25% RLE Required Braces or Orthoses: Cervical Brace Cervical Brace: Hard collar, For comfort Restrictions Weight Bearing Restrictions: Yes RLE Weight Bearing: Partial weight bearing RLE Partial Weight Bearing Percentage or Pounds: 25%  Pain: Pt denies c/o pain.   Therapy/Group: Individual Therapy  Waunita Schooner 02/20/2020, 1:38 PM

## 2020-02-20 NOTE — Progress Notes (Signed)
[place] PHYSICAL MEDICINE & REHABILITATION PROGRESS NOTE   Subjective/Complaints: [name] slept well last night. He has no complaints this morning. Denies pain.  Had a good OT session this morning.     ROS: Patient denies fever, rash, sore throat, blurred vision, nausea, vomiting, diarrhea, cough, shortness of breath or chest pain, joint or back pain, headache, or mood change.   Objective:   No results found. No results for input(s): WBC, HGB, HCT, PLT in the last 72 hours. No results for input(s): NA, K, CL, CO2, GLUCOSE, BUN, CREATININE, CALCIUM in the last 72 hours.  Intake/Output Summary (Last 24 hours) at 02/20/2020 0904 Last data filed at 02/20/2020 0800 Gross per 24 hour  Intake 840 ml  Output 1530 ml  Net -690 ml     Physical Exam: Vital Signs Blood pressure 120/78, pulse 99, temperature 98.1 F (36.7 C), temperature source Oral, resp. rate 18, height 5\' 7"  (1.702 m), weight 76.5 kg, SpO2 98 %. Constitutional: No distress . Vital signs reviewed. Sitting up in chair after therapy session.   HEENT: EOMI, oral membranes moist Neck: supple Cardiovascular: RRR without murmur. No JVD    Respiratory/Chest: CTA Bilaterally without wheezes or rales. Normal effort    GI/Abdomen: BS +, non-tender, non-distended Ext: no clubbing, cyanosis, or edema Skin: right hip incisions cdi, right foot wounds with minimal to no-drainage, healing well.  Neurologic: Cranial nerves II through XII intact, motor strength is 5/5 in bilateral deltoid, bicep, tricep, grip,LEFT  hip flexor, knee extensors, ankle dorsiflexor and plantar flexor   3-4HF, KE, ADF, right leg limited by pain Sensory exam reduced LT sensation below the ankles bilaterally in stocking glove pattern  Musculoskeletal: reduced Right hip ROM, no evidence of knee effusion bilaterally.  Psych: Pleasant disposition.   Assessment/Plan: 1. Functional deficits secondary to Right IT hip fx  which require 3+ hours per day of  interdisciplinary therapy in a comprehensive inpatient rehab setting.  Physiatrist is providing close team supervision and 24 hour management of active medical problems listed below.  Physiatrist and rehab team continue to assess barriers to discharge/monitor patient progress toward functional and medical goals  Care Tool:  Bathing    Body parts bathed by patient: Right arm, Left arm, Chest, Abdomen, Right upper leg, Left upper leg, Face, Front perineal area, Buttocks, Left lower leg   Body parts bathed by helper: Left lower leg Body parts n/a: Right lower leg   Bathing assist Assist Level: Minimal Assistance - Patient > 75%     Upper Body Dressing/Undressing Upper body dressing   What is the patient wearing?: Pull over shirt    Upper body assist Assist Level: Supervision/Verbal cueing    Lower Body Dressing/Undressing Lower body dressing      What is the patient wearing?: Pants, Incontinence brief     Lower body assist Assist for lower body dressing: Minimal Assistance - Patient > 75%     Toileting Toileting    Toileting assist Assist for toileting: Minimal Assistance - Patient > 75%     Transfers Chair/bed transfer  Transfers assist     Chair/bed transfer assist level: Minimal Assistance - Patient > 75%     Locomotion Ambulation   Ambulation assist      Assist level: Minimal Assistance - Patient > 75% Assistive device: Walker-rolling Max distance: 12'   Walk 10 feet activity   Assist     Assist level: Contact Guard/Touching assist Assistive device: Walker-rolling   Walk 50 feet activity  Assist Walk 50 feet with 2 turns activity did not occur: Safety/medical concerns(2/2 pain in RLE and pt fatigue)  Assist level: Contact Guard/Touching assist Assistive device: Walker-rolling    Walk 150 feet activity   Assist Walk 150 feet activity did not occur: Safety/medical concerns  Assist level: Minimal Assistance - Patient > 75% Assistive  device: Walker-rolling    Walk 10 feet on uneven surface  activity   Assist Walk 10 feet on uneven surfaces activity did not occur: Safety/medical concerns         Wheelchair     Assist   Type of Wheelchair: Manual    Wheelchair assist level: Supervision/Verbal cueing Max wheelchair distance: 150'    Wheelchair 50 feet with 2 turns activity    Assist        Assist Level: Supervision/Verbal cueing   Wheelchair 150 feet activity     Assist      Assist Level: Supervision/Verbal cueing   Blood pressure 120/78, pulse 99, temperature 98.1 F (36.7 C), temperature source Oral, resp. rate 18, height 5\' 7"  (1.702 m), weight 76.5 kg, SpO2 98 %.  Medical Problem List and Plan:  1.  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  Partial weightbearing 25% right lower extremity Rehab evals to day ,              -patient may shower with wrapping over right foot and ankle             -ELOS/Goals: 14 to 16 days with supervision to min assist goals for ADLs and mobility  -Continue PT and OT  2.  Antithrombotics:  -DVT/anticoagulation: Lovenox.  Normal  vascular study              -antiplatelet therapy: N/A  3. Pain Management: Hydrocodone and Robaxin as needed. Well controlled.   4. Mood: Zoloft 200 mg daily              -antipsychotic agents: N/A  5. Neuropsych: This patient is capable of making decisions on his own behalf.  6. Skin/Wound Care: Routine skin checks-  RIght hip staples out ~3/3; will discuss with nursing that staples can be removed today.   3/4: staples removed yesterday.   -right hip and toe wounds cdi, healing well.   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 Fluids/Electrolytes/Nutrition: Routine in and outs with follow-up chemistries, elevated BUN, encourage fluids .  D/C  IV  8.  Acute on chronic anemia.  Followed by Dr. [name]. continue iron supplement.  Follow-up CBC  9.  Undisplaced fracture lamina bilaterally of C6.  healed as of CT 2/17 CT CERVICAL SPINE FINDINGS  Alignment: No static subluxation. Facets are aligned. Occipital condyles are normally positioned.  Skull base and vertebrae: No acute fracture.  Soft tissues and spinal canal: No prevertebral fluid or swelling. No visible canal hematoma.  Disc levels: No bony spinal canal stenosis. Multilevel mild vertebral body height loss, particularly at C3 and C4. Facet hypertrophy is worst at right C3-4 and left C4-5.  Upper chest: No pneumothorax, pulmonary nodule or pleural effusion.  Other: Normal visualized paraspinal cervical soft tissues.  IMPRESSION: 1. Chronic ischemic microangiopathy without acute intracranial abnormality. 2. No acute fracture or static subluxation of the cervical spine.   Electronically Signed   By: Ulyses Jarred M.D.   On: 02/04/2020 00:54  10.  Chronic diastolic  congestive heart failure.  Followed by Dr. [name] monitor for any signs of fluid overload.  Patient currently euvolemic no edema home Demadex Lasix on hold resume in 1 to 2 days.  Daily weights ordered; not being performed; reordered 3/1.   3/4-3/5: weight decreasing.  Filed Weights   02/18/20 0442 02/19/20 0410 02/20/20 0531  Weight: 78.1 kg 77.6 kg 76.5 kg    2/28: bp elevated yesterday, back to baseline today. Stable 3/1, 3/2, 3/3, 3/4, 3/5 11.  History of atrial fibrillation.  Coumadin in the past discontinued 2014 due to spontaneous subdural hematoma with craniotomy.  Toprol 12.5 mg 12.5 mg  Daily. Cardiac rate controlled Vitals:   02/19/20 2001 02/20/20 0531  BP: 118/72 120/78  Pulse: 96 99  Resp: 18 18  Temp: 98.5 F (36.9 C) 98.1 F (36.7 C)  SpO2: 97% 98%  2/28 HR in 80's to 100's---observe, encourage adequate fluids 3/1, 3/2, 3/3, 3/4, 3/5: stable.   12.  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  Following chemistries  13.  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  No chest pain or shortness of breath  14.  Diet-controlled diabetes mellitus..  CBGs discontinued  15.  BPH.  Flomax 0.4 mg nightly,Myrbetriq 50 mg daily.  Check PVR  16.  Hyperlipidemia.  Crestor 40 mg daily  17.  OSA/pulmonary fibrosis.  CPAP.  Patient has been refusing at times.  18. Constipation: Will increase Colace to TID. 3/2: Will add Senna HS. Well controlled with this regimen.     LOS: 11 days A FACE TO FACE EVALUATION WAS PERFORMED  Levis Nazir P Shakemia Madera 02/20/2020, 9:04 AM

## 2020-02-20 NOTE — Progress Notes (Signed)
Occupational Therapy Session Note  Patient Details  Name: [name]. MRN: [id number] Date of Birth: [birth year]  Today's Date: 02/20/2020 OT Individual Time: [id number] OT Individual Time Calculation (min): 55 min    Short Term Goals: Week 2:  OT Short Term Goal 1 (Week 2): Continue working on established LTGs set at contact guard assist.  Skilled Therapeutic Interventions/Progress Updates:    Pt worked on Multimedia programmer and dressing during session.  He was able to transfer from supine to sit EOB with supervision.  He then needed min assist for sit to stand from the EOB and ambulate to the shower bench with use of the RW for support.  He completed all bathing sit to stand with min guard assist using BUEs on grab bars with sit to stand and standing as well as a LH sponge for washing both of his feet.  He transferred out to the wheelchair for dressing and grooming tasks with min assist using the RW.  UB dressing was performed at supervision level with all LB dressing at min assist level sit to stand.  He completed brushing his hair as well as his teeth at a modified independent level from the wheelchair.  Pt left with call button and phone in reach and safety alarm belt in place.   Therapy Documentation Precautions:  Precautions Precautions: Fall, Other (comment) Precaution Comments: Partial WB 25% RLE Required Braces or Orthoses: Cervical Brace Cervical Brace: Hard collar, For comfort Restrictions Weight Bearing Restrictions: Yes RLE Weight Bearing: Partial weight bearing RLE Partial Weight Bearing Percentage or Pounds: 25%  Pain: Pain Assessment Pain Scale: 0-10 Pain Score: 5  Pain Type: Surgical pain Pain Location: Hip Pain Orientation: Right Pain Descriptors / Indicators: Discomfort Pain Onset: With Activity Pain Intervention(s): Repositioned Multiple Pain Sites: No ADL: See Care Tool Section for some details of mobility and selfcare  Therapy/Group: Individual  Therapy  Alejandrina Raimer OTR/L 02/20/2020, 8:56 AM

## 2020-02-21 ENCOUNTER — Inpatient Hospital Stay (HOSPITAL_COMMUNITY): Payer: Medicare Other | Admitting: Physical Therapy

## 2020-02-21 NOTE — Progress Notes (Signed)
[facility] PHYSICAL MEDICINE & REHABILITATION PROGRESS NOTE   Subjective/Complaints: [name] slept well last night. He has no complaints this morning. Denies pain.  Enjoying his lunch    ROS: Patient denies fever, rash, sore throat, blurred vision, nausea, vomiting, diarrhea, cough, shortness of breath or chest pain, joint or back pain, headache, or mood change.   Objective:   No results found. No results for input(s): WBC, HGB, HCT, PLT in the last 72 hours. No results for input(s): NA, K, CL, CO2, GLUCOSE, BUN, CREATININE, CALCIUM in the last 72 hours.  Intake/Output Summary (Last 24 hours) at 02/21/2020 1540 Last data filed at 02/21/2020 1303 Gross per 24 hour  Intake 889 ml  Output 900 ml  Net -11 ml     Physical Exam: Vital Signs Blood pressure 119/79, pulse 100, temperature 98.6 F (37 C), temperature source Oral, resp. rate 14, height 5\' 7"  (1.702 m), weight 77.5 kg, SpO2 100 %. Constitutional: No distress . Vital signs reviewed. Sitting up in chair eating lunch HEENT: EOMI, oral membranes moist Neck: supple Cardiovascular: RRR without murmur. No JVD    Respiratory/Chest: CTA Bilaterally without wheezes or rales. Normal effort    GI/Abdomen: BS +, non-tender, non-distended Ext: no clubbing, cyanosis, or edema Skin: right hip incisions cdi, right foot wounds with minimal to no-drainage, healing well.  Neurologic: Cranial nerves II through XII intact, motor strength is 5/5 in bilateral deltoid, bicep, tricep, grip,LEFT  hip flexor, knee extensors, ankle dorsiflexor and plantar flexor   3-4HF, KE, ADF, right leg limited by pain Sensory exam reduced LT sensation below the ankles bilaterally in stocking glove pattern  Musculoskeletal: reduced Right hip ROM, no evidence of knee effusion bilaterally.  Psych: Pleasant disposition.   Assessment/Plan: 1. Functional deficits secondary to Right IT hip fx  which require 3+ hours per day of interdisciplinary therapy in a  comprehensive inpatient rehab setting.  Physiatrist is providing close team supervision and 24 hour management of active medical problems listed below.  Physiatrist and rehab team continue to assess barriers to discharge/monitor patient progress toward functional and medical goals  Care Tool:  Bathing    Body parts bathed by patient: Right arm, Left arm, Chest, Abdomen, Right upper leg, Left upper leg, Face, Front perineal area, Buttocks, Left lower leg   Body parts bathed by helper: Left lower leg Body parts n/a: Right lower leg   Bathing assist Assist Level: Minimal Assistance - Patient > 75%     Upper Body Dressing/Undressing Upper body dressing   What is the patient wearing?: Pull over shirt    Upper body assist Assist Level: Supervision/Verbal cueing    Lower Body Dressing/Undressing Lower body dressing      What is the patient wearing?: Pants, Incontinence brief     Lower body assist Assist for lower body dressing: Minimal Assistance - Patient > 75%     Toileting Toileting    Toileting assist Assist for toileting: Minimal Assistance - Patient > 75%     Transfers Chair/bed transfer  Transfers assist     Chair/bed transfer assist level: Supervision/Verbal cueing     Locomotion Ambulation   Ambulation assist      Assist level: Supervision/Verbal cueing Assistive device: Walker-rolling Max distance: 150   Walk 10 feet activity   Assist     Assist level: Supervision/Verbal cueing Assistive device: Walker-rolling   Walk 50 feet activity   Assist Walk 50 feet with 2 turns activity did not occur: Safety/medical concerns(2/2 pain in RLE  and pt fatigue)  Assist level: Supervision/Verbal cueing Assistive device: Walker-rolling    Walk 150 feet activity   Assist Walk 150 feet activity did not occur: Safety/medical concerns  Assist level: Supervision/Verbal cueing Assistive device: Walker-rolling    Walk 10 feet on uneven surface   activity   Assist Walk 10 feet on uneven surfaces activity did not occur: Safety/medical concerns         Wheelchair     Assist   Type of Wheelchair: Manual    Wheelchair assist level: Supervision/Verbal cueing Max wheelchair distance: 150'    Wheelchair 50 feet with 2 turns activity    Assist        Assist Level: Supervision/Verbal cueing   Wheelchair 150 feet activity     Assist      Assist Level: Supervision/Verbal cueing   Blood pressure 119/79, pulse 100, temperature 98.6 F (37 C), temperature source Oral, resp. rate 14, height 5\' 7"  (1.702 m), weight 77.5 kg, SpO2 100 %.  Medical Problem List and Plan:  1.  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  Partial weightbearing 25% right lower extremity Rehab evals to day ,              -patient may shower with wrapping over right foot and ankle             -ELOS/Goals: 14 to 16 days with supervision to min assist goals for ADLs and mobility  -Continue PT and OT  2.  Antithrombotics:  -DVT/anticoagulation: Lovenox.  Normal  vascular study              -antiplatelet therapy: N/A  3. Pain Management: Hydrocodone and Robaxin as needed. Well controlled.   4. Mood: Zoloft 200 mg daily              -antipsychotic agents: N/A  5. Neuropsych: This patient is capable of making decisions on his own behalf.  6. Skin/Wound Care: Routine skin checks-  RIght hip staples out ~3/3; will discuss with nursing that staples can be removed today.   3/4: staples removed yesterday.   -right hip and toe wounds cdi, healing well.   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 Fluids/Electrolytes/Nutrition: Routine in and outs with follow-up chemistries, elevated BUN, encourage fluids .  D/C IV  8.  Acute on chronic anemia.  Followed by Dr. [name].  continue iron supplement.  Follow-up CBC  9.  Undisplaced fracture lamina bilaterally of C6.  healed as of CT 2/17 CT CERVICAL SPINE FINDINGS  Alignment: No static subluxation. Facets are aligned. Occipital condyles are normally positioned.  Skull base and vertebrae: No acute fracture.  Soft tissues and spinal canal: No prevertebral fluid or swelling. No visible canal hematoma.  Disc levels: No bony spinal canal stenosis. Multilevel mild vertebral body height loss, particularly at C3 and C4. Facet hypertrophy is worst at right C3-4 and left C4-5.  Upper chest: No pneumothorax, pulmonary nodule or pleural effusion.  Other: Normal visualized paraspinal cervical soft tissues.  IMPRESSION: 1. Chronic ischemic microangiopathy without acute intracranial abnormality. 2. No acute fracture or static subluxation of the cervical spine.   Electronically Signed   By: Ulyses Jarred M.D.   On: 02/04/2020 00:54  10.  Chronic diastolic congestive heart failure.  Followed by Dr. [name] monitor for any signs of fluid overload.  Patient currently euvolemic no edema  home Demadex Lasix on hold resume in 1 to 2 days.  Daily weights ordered; not being performed; reordered 3/1.   3/4-3/5: weight decreasing.   3/6: slightly increased Filed Weights   02/19/20 0410 02/20/20 0531 02/21/20 0449  Weight: 77.6 kg 76.5 kg 77.5 kg    2/28: bp elevated yesterday, back to baseline today. Stable 3/1, 3/2, 3/3, 3/4, 3/5, 3/6 11.  History of atrial fibrillation.  Coumadin in the past discontinued 2014 due to spontaneous subdural hematoma with craniotomy.  Toprol 12.5 mg 12.5 mg  Daily. Cardiac rate controlled Vitals:   02/21/20 0851 02/21/20 1440  BP: 121/64 119/79  Pulse: 60 100  Resp:  14  Temp:  98.6 F (37 C)  SpO2:  100%  2/28 HR in 80's to 100's---observe, encourage adequate fluids 3/1, 3/2, 3/3, 3/4, 3/5, 3/6 stable.   12.  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  Following  chemistries  13.  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  No chest pain or shortness of breath  14.  Diet-controlled diabetes mellitus..  CBGs discontinued  15.  BPH.  Flomax 0.4 mg nightly,Myrbetriq 50 mg daily.  Check PVR  16.  Hyperlipidemia.  Crestor 40 mg daily  17.  OSA/pulmonary fibrosis.  CPAP.  Patient has been refusing at times.  18. Constipation: Will increase Colace to TID. 3/2: Will add Senna HS. Well controlled with this regimen.     LOS: 12 days A FACE TO FACE EVALUATION WAS PERFORMED  [name] 02/21/2020, 3:40 PM

## 2020-02-21 NOTE — Progress Notes (Signed)
Physical Therapy Session Note  Patient Details  Name: [name]. MRN: [id number] Date of Birth: 1941/07/03  Today's Date: 02/21/2020 PT Individual Time: 0915-1003 PT Individual Time Calculation (min): 48 min   Short Term Goals: Week 1:  PT Short Term Goal 1 (Week 1): Patient will perform sit<>stand from elevated surface with modA PT Short Term Goal 1 - Progress (Week 1): Met PT Short Term Goal 2 (Week 1): Patient will perform stand pivot transfer with minA PT Short Term Goal 2 - Progress (Week 1): Met PT Short Term Goal 3 (Week 1): Patient will participate in stair training PT Short Term Goal 3 - Progress (Week 1): Met PT Short Term Goal 4 (Week 1): Patient will ambulate 57'  on level surfaces with minA PT Short Term Goal 4 - Progress (Week 1): Met  Skilled Therapeutic Interventions/Progress Updates:  Pt was seen bedside in the am. Pt transferred supine to edge of bed with head of bed elevated, side rail and S with increased time. Pt transferred sit to stand with rolling walker and S. Pt ambulated 150 feet with rolling walker and S with pressure alarming shoe to assist with maintaining WB. In gym pt performed multiple sit to stand transfers with rolling walker and S. Pt performed step taps with R LE, 3 sets x 10 reps each. Pt performed LAQs B LEs, 3 sets x 10 reps each. Pt ambulated back to room with rolling walker and S with slow cadence. Pt able to maintain PWB R LE. Pt transferred edge of bed to supine with S and verbal cues. Pt left sitting up in bed with all needs within reach and bed alarm on.   Therapy Documentation Precautions:  Precautions Precautions: Fall, Other (comment) Precaution Comments: Partial WB 25% RLE Required Braces or Orthoses: Cervical Brace Cervical Brace: Hard collar, For comfort Restrictions Weight Bearing Restrictions: Yes RLE Weight Bearing: Partial weight bearing RLE Partial Weight Bearing Percentage or Pounds: 25% General:   Pain: Pain  Assessment Pain Scale: 0-10 Pain Score: 0-No pain    Therapy/Group: Individual Therapy  Dub Amis 02/21/2020, 12:16 PM

## 2020-02-22 ENCOUNTER — Inpatient Hospital Stay (HOSPITAL_COMMUNITY): Payer: Medicare Other

## 2020-02-22 NOTE — Progress Notes (Addendum)
Physical Therapy Session Note  Patient Details  Name: [name]. MRN: [id number] Date of Birth: [birth year]  Today's Date: 02/22/2020 PT Individual Time: 1500-1600 PT Individual Time Calculation (min): 60 min    Short Term Goals: Week 2:  PT Short Term Goal 1 (Week 2): STG = LTG d/t ELOS  Skilled Therapeutic Interventions/Progress Updates:    Patient seated in w/c upon PT arrival, agreeable to therapy tx, denies pain. Sit > standing at RW with CGA. Pt ambulated 150' with RW and CGA to rehab gym while wearing WB sensor boot for auditory feedback, pt inconsistently performed gait with maintaining precautions, min cueing for gait pattern. Once in rehab gym, pt performed multiple bouts of gait 20-40' with RW working of sequencing and maintaining precautions, with cues for technique, weight shifting and utilizing UE strength to off-weight RLE. Throughout, therapist adjusted sensor with taking into consideration 25% of pt's weight and using hand weights to assess correct sensitivity of WB sensor boot. Pt ambulated 40' with RW to steps with CGA. Pt ascended/descended 4 steps with step-to pattern B rails and CGA-minA with cues for sequencing. D/t pt's home environment, therapist brainstormed various methods of negotiating steps with use of 1 rail only and without use of rails. Attempted to have pt perform side stepping to ascend/descend x 2 steps with R rail however pt unable to maintain precautions. Pt performed stair negotiation backwards with RW in front of pt, minA + 2 with therapist behind pt and the other in front of pt to stabilize RW, pt able to inconsistently maintain precautions with this method. Therapist discussed important of maintaining precautions given pt's medical hx and a how violating those precautions can lead to poor healing, therapist also dicussed importance of having railings installed at home or using a temporary ramp while pt's has PWB restrictions - pt understood and  acknowledged important. Pt transported in w/c back to room. Pt left seated in w/c with needs in reach and chair alarm set.    Therapy Documentation Precautions:  Precautions Precautions: Fall, Other (comment) Precaution Comments: Partial WB 25% RLE Required Braces or Orthoses: Cervical Brace Cervical Brace: Hard collar, For comfort Restrictions Weight Bearing Restrictions: Yes RLE Weight Bearing: Partial weight bearing RLE Partial Weight Bearing Percentage or Pounds: 25%    Therapy/Group: Individual Therapy  [name] SPT 02/22/2020, 8:02 AM

## 2020-02-22 NOTE — Progress Notes (Signed)
[facility] PHYSICAL MEDICINE & REHABILITATION PROGRESS NOTE   Subjective/Complaints: Mr. Durst slept well last night. He has no complaints this morning. Denies pain.  Lying in bed comfortably.     ROS: Patient denies fever, rash, sore throat, blurred vision, nausea, vomiting, diarrhea, cough, shortness of breath or chest pain, joint or back pain, headache, or mood change.   Objective:   No results found. No results for input(s): WBC, HGB, HCT, PLT in the last 72 hours. No results for input(s): NA, K, CL, CO2, GLUCOSE, BUN, CREATININE, CALCIUM in the last 72 hours.  Intake/Output Summary (Last 24 hours) at 02/22/2020 1236 Last data filed at 02/22/2020 0901 Gross per 24 hour  Intake 649 ml  Output 1050 ml  Net -401 ml     Physical Exam: Vital Signs Blood pressure 111/90, pulse 100, temperature 98 F (36.7 C), temperature source Oral, resp. rate 14, height 5\' 7"  (1.702 m), weight 77.3 kg, SpO2 98 %. Constitutional: No distress . Vital signs reviewed. Sitting up in bed.  HEENT: EOMI, oral membranes moist Neck: supple Cardiovascular: RRR without murmur. No JVD    Respiratory/Chest: CTA Bilaterally without wheezes or rales. Normal effort    GI/Abdomen: BS +, non-tender, non-distended Ext: no clubbing, cyanosis, or edema Skin: right hip incisions cdi, right foot wounds with minimal to no-drainage, healing well.  Neurologic: Cranial nerves II through XII intact, motor strength is 5/5 in bilateral deltoid, bicep, tricep, grip,LEFT  hip flexor, knee extensors, ankle dorsiflexor and plantar flexor   3-4HF, KE, ADF, right leg limited by pain Sensory exam reduced LT sensation below the ankles bilaterally in stocking glove pattern  Musculoskeletal: reduced Right hip ROM, no evidence of knee effusion bilaterally.  Psych: Pleasant disposition.   Assessment/Plan: 1. Functional deficits secondary to Right IT hip fx  which require 3+ hours per day of interdisciplinary therapy in a  comprehensive inpatient rehab setting.  Physiatrist is providing close team supervision and 24 hour management of active medical problems listed below.  Physiatrist and rehab team continue to assess barriers to discharge/monitor patient progress toward functional and medical goals  Care Tool:  Bathing    Body parts bathed by patient: Right arm, Left arm, Chest, Abdomen, Right upper leg, Left upper leg, Face, Front perineal area, Buttocks, Left lower leg   Body parts bathed by helper: Left lower leg Body parts n/a: Right lower leg   Bathing assist Assist Level: Minimal Assistance - Patient > 75%     Upper Body Dressing/Undressing Upper body dressing   What is the patient wearing?: Pull over shirt    Upper body assist Assist Level: Supervision/Verbal cueing    Lower Body Dressing/Undressing Lower body dressing      What is the patient wearing?: Pants, Incontinence brief     Lower body assist Assist for lower body dressing: Minimal Assistance - Patient > 75%     Toileting Toileting    Toileting assist Assist for toileting: Minimal Assistance - Patient > 75%     Transfers Chair/bed transfer  Transfers assist     Chair/bed transfer assist level: Supervision/Verbal cueing     Locomotion Ambulation   Ambulation assist      Assist level: Supervision/Verbal cueing Assistive device: Walker-rolling Max distance: 150   Walk 10 feet activity   Assist     Assist level: Supervision/Verbal cueing Assistive device: Walker-rolling   Walk 50 feet activity   Assist Walk 50 feet with 2 turns activity did not occur: Safety/medical concerns(2/2 pain in  RLE and pt fatigue)  Assist level: Supervision/Verbal cueing Assistive device: Walker-rolling    Walk 150 feet activity   Assist Walk 150 feet activity did not occur: Safety/medical concerns  Assist level: Supervision/Verbal cueing Assistive device: Walker-rolling    Walk 10 feet on uneven surface   activity   Assist Walk 10 feet on uneven surfaces activity did not occur: Safety/medical concerns         Wheelchair     Assist   Type of Wheelchair: Manual    Wheelchair assist level: Supervision/Verbal cueing Max wheelchair distance: 150'    Wheelchair 50 feet with 2 turns activity    Assist        Assist Level: Supervision/Verbal cueing   Wheelchair 150 feet activity     Assist      Assist Level: Supervision/Verbal cueing   Blood pressure 111/90, pulse 100, temperature 98 F (36.7 C), temperature source Oral, resp. rate 14, height 5\' 7"  (1.702 m), weight 77.3 kg, SpO2 98 %.  Medical Problem List and Plan:  1.  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  Partial weightbearing 25% right lower extremity Rehab evals to day ,              -patient may shower with wrapping over right foot and ankle             -ELOS/Goals: 14 to 16 days with supervision to min assist goals for ADLs and mobility  -Continue PT and OT  2.  Antithrombotics:  -DVT/anticoagulation: Lovenox.  Normal  vascular study              -antiplatelet therapy: N/A  3. Pain Management: Hydrocodone and Robaxin as needed. Well controlled.   4. Mood: Zoloft 200 mg daily              -antipsychotic agents: N/A  5. Neuropsych: This patient is capable of making decisions on his own behalf.  6. Skin/Wound Care: Routine skin checks-  RIght hip staples out ~3/3; will discuss with nursing that staples can be removed today.   3/4: staples removed yesterday.   -right hip and toe wounds cdi, healing well.   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 Fluids/Electrolytes/Nutrition: Routine in and outs with follow-up chemistries, elevated BUN, encourage fluids .  D/C IV  8.  Acute on chronic anemia.  Followed by Dr. [name].  continue iron supplement.  Follow-up CBC  9.  Undisplaced fracture lamina bilaterally of C6.  healed as of CT 2/17 CT CERVICAL SPINE FINDINGS  Alignment: No static subluxation. Facets are aligned. Occipital condyles are normally positioned.  Skull base and vertebrae: No acute fracture.  Soft tissues and spinal canal: No prevertebral fluid or swelling. No visible canal hematoma.  Disc levels: No bony spinal canal stenosis. Multilevel mild vertebral body height loss, particularly at C3 and C4. Facet hypertrophy is worst at right C3-4 and left C4-5.  Upper chest: No pneumothorax, pulmonary nodule or pleural effusion.  Other: Normal visualized paraspinal cervical soft tissues.  IMPRESSION: 1. Chronic ischemic microangiopathy without acute intracranial abnormality. 2. No acute fracture or static subluxation of the cervical spine.   Electronically Signed   By: Ulyses Jarred M.D.   On: 02/04/2020 00:54  10.  Chronic diastolic congestive heart failure.  Followed by Dr. [name] monitor for any signs of fluid overload.  Patient currently euvolemic no  edema home Demadex Lasix on hold resume in 1 to 2 days.  Daily weights ordered; not being performed; reordered 3/1.   3/4-3/5: weight decreasing.   3/6: slightly increased Filed Weights   02/20/20 0531 02/21/20 0449 02/22/20 0523  Weight: 76.5 kg 77.5 kg 77.3 kg    2/28: bp elevated yesterday, back to baseline today. Stable 3/1, 3/2, 3/3, 3/4, 3/5, 3/6, 3/7 11.  History of atrial fibrillation.  Coumadin in the past discontinued 2014 due to spontaneous subdural hematoma with craniotomy.  Toprol 12.5 mg 12.5 mg  Daily. Cardiac rate controlled Vitals:   02/22/20 0529 02/22/20 0846  BP: 109/77 111/90  Pulse: (!) 105 100  Resp:    Temp:    SpO2:    2/28 HR in 80's to 100's---observe, encourage adequate fluids 3/1, 3/2, 3/3, 3/4, 3/5, 3/6, 3/7 stable.   12.  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  Following  chemistries  13.  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  No chest pain or shortness of breath  14.  Diet-controlled diabetes mellitus..  CBGs discontinued  15.  BPH.  Flomax 0.4 mg nightly,Myrbetriq 50 mg daily.  Check PVR  16.  Hyperlipidemia.  Crestor 40 mg daily  17.  OSA/pulmonary fibrosis.  CPAP.  Patient has been refusing at times.  18. Constipation: Will increase Colace to TID. 3/2: Will add Senna HS. Well controlled with this regimen.     LOS: 13 days A FACE TO FACE EVALUATION WAS PERFORMED  Clide Deutscher Aundreya Souffrant 02/22/2020, 12:36 PM

## 2020-02-23 ENCOUNTER — Encounter (HOSPITAL_COMMUNITY): Payer: Medicare Other | Admitting: Occupational Therapy

## 2020-02-23 ENCOUNTER — Inpatient Hospital Stay (HOSPITAL_COMMUNITY): Payer: Medicare Other

## 2020-02-23 ENCOUNTER — Ambulatory Visit (HOSPITAL_COMMUNITY): Payer: Medicare Other

## 2020-02-23 LAB — CREATININE, SERUM
Creatinine, Ser: 1.66 mg/dL — ABNORMAL HIGH (ref 0.61–1.24)
GFR calc Af Amer: 45 mL/min — ABNORMAL LOW (ref >60)
GFR calc non Af Amer: 39 mL/min — ABNORMAL LOW (ref >60)

## 2020-02-23 NOTE — Progress Notes (Signed)
Physical Therapy Session Note  Patient Details  Name: [name]. MRN: [id number] Date of Birth: [birth year]  Today's Date: 02/23/2020 PT Individual Time: [id number] and 1300-1415 PT Individual Time Calculation (min): 60 min and 75 min  Short Term Goals: Week 2:  PT Short Term Goal 1 (Week 2): STG = LTG d/t ELOS  Skilled Therapeutic Interventions/Progress Updates:    Session 1: Patient seated in w/c upon PT arrival, agreeable to therapy tx, denies pain. Therapist assisted to don L shoe and R weightbearing shoe. Pt performed sit>stand with RW and supervision, ambulated from room>gym x 200 ft with use of alarm weightbearing shoe for feedback of weightbearing precautions, pt able to maintain partial weightbearing throughout, beeping <10% of the time. Pt's wife present this session for family education. Pt ascended/descended x2 steps (6 inch) going backwards to ascend with UE support on RW, performed x 1 trial with assist from therapist to guard and second helper to stabilize RW for UE support - therapist providing verbal education and demonstrate for wife regarding techniques. Pt performed x 1 trial step negotiation with therapist guarding and wife providing assist to stabilize RW. Therapist demonstrated techniques for stair negotiation with RW one more time, taking pictures to provide education handout for wife. Pt performed stand pivot to w/c with supervision/CGA using RW, therapist providing education to wife regarding techniques and cues to provide, pt maintaining PWB throughout. Pt transported to the ortho gym. Pt performed car transfer this session with use of RW and CGA, therapist providing education to wife regarding techniques. PT ambulated x 5 ft with RW and CGA from therapist while providing education to wife, pt then ambulated x 50 ft with his wife providing appropriate guarding, cues & assist while pt maintained PWB precautions throughout. Pt transported back to room and left in w/c with  needs in reach and chair alarm set, wife present.    Session 2: Patient seated in w/c upon PT arrival, agreeable to therapy tx, denies pain. Therapist assisted to don R WB shoe. Pt performed sit > stand with RW and supervision, ambulated to rehab gym x 200' with use of alarm WB shoe for feedback of PWB precautions. Pt demonstrated ability to maintain PWB throughout receiving auditory feedback from shoe <5% of the time. Pt participated in blocked practice of stair negotiation, with intermittent seated rest breaks, as performed in the AM session with therapist guarding pt and a second helper to stabilize RW for UE support. During blocked practice, pt had to verbalize each step regarding sequencing and technique ("up with the good, down with the bad") prior to initiating movement and tell there second helper what to do as if she didn't know to carry over into home environment with new people assisting pt. Sit > stand at [facility] with supervision, pt performed R hip flexion x 10 and R hip extension x 10. Pt ambulated 5' with RW and supervision to countertop, therapist educated pt on importance of having a stable surface in front for UE support and chair behind pt for safety, pt performed R hip abduction x 10. Pt ambulated 15' to mat table with RW and supervision. Stand > sit with supervision. Pt performed seated LAQ against gravity x 10 each LE with cues for hold at top of contraction. Sit > supine on mat with supervision. In supine, pt performed heel slides x 10 each LE, SLR x 10 each LE, and glute sets w/3 sec hold and bolster under LE, cues for technique. Supine > sit >  standing at [facility] with supervision, pt ambulated 37' with RW and supervision. Stand > sitting in w/c with supervision d/t inc fatigue noted by inc in auditory feedback from WB shoe. Therapist transported pt back to room in w/c. Pt left seated in w/c with needs in reach and chair alarm set.   Therapy Documentation Precautions:  Precautions Precautions:  Fall, Other (comment) Precaution Comments: Partial WB 25% RLE Required Braces or Orthoses: Cervical Brace Cervical Brace: Hard collar, For comfort Restrictions Weight Bearing Restrictions: Yes RLE Weight Bearing: Partial weight bearing RLE Partial Weight Bearing Percentage or Pounds: 25%    Therapy/Group: Individual Therapy  Juliann Pulse SPT  Netta Corrigan, PT, DPT, CSRS 02/23/20  9:05 AM  02/23/2020, 7:27 AM

## 2020-02-23 NOTE — Progress Notes (Signed)
Occupational Therapy Session Note  Patient Details  Name: [name]. MRN: [id number] Date of Birth: January 16, 1941  Today's Date: 02/23/2020 OT Individual Time: 1002-1120 OT Individual Time Calculation (min): 78 min    Short Term Goals: Continue working on established LTGs set a min guard to supervision.  Skilled Therapeutic Interventions/Progress Updates:    Pt's spouse in for family education this session.  Educated pt and spouse on simulated walk-in shower transfer, stepping in posteriorly with the LLE first and then sitting on a 3:1 placed sideways, with the RUE out on the edge of the shower to decrease weightbearing.  Discussed the need for him to complete all bathing and drying off on the 3:1 and then having her place a towel down to dry the floor of the shower as well as his feet.  The towel would then need to be removed for safety prior to him standing.  She voiced understanding and stated that throw rugs had already been removed previously as well.  Next, had her assist pt into the bathroom for toileting on the 3:1 with use of the walker.  She was encouraged to use the gait belt when he is up as to help him easier if there is a LOB when managing his clothing.  He needed min guard for the transfer into the bathroom with use of the forceguard weightbearing device on the right foot to monitor weightbearing.  He placed too much weight through the RLE on only one occasion during the transfer in and out.  He did exhibit one LOB to the left when managing clothing, which she was able to assist with to keep him back on balance.  After education was complete, therapist took pt down to the ortho gym for work on [place] with use of the UE ergonometer.  He was able to complete 10 straight mins on Random Program setting with resistance placed on level 10 and RPMs maintained at 25-30.  Five mins were completed peddling forward and 5 mins peddling backwards.  Finished session with completion of  BUE therex exercises with use of the medium resistance green band for 1 set of 10 reps for shoulder flexion, elbow flexion, elbow extension, and horizontal shoulder abduction with min instructional cueing.  Pt left in the room with spouse present and safety belt in place.  Call button and phone in reach with all questions answered.  Pt will be ready from OT standpoint for discharge on 3/10.    Therapy Documentation Precautions:  Precautions Precautions: Fall, Other (comment) Precaution Comments: Partial WB 25% RLE Required Braces or Orthoses: Cervical Brace Cervical Brace: Hard collar, For comfort Restrictions Weight Bearing Restrictions: Yes RLE Weight Bearing: Touchdown weight bearing RLE Partial Weight Bearing Percentage or Pounds: 25%  Pain: Pain Assessment Pain Scale: Faces Pain Score: 0-No pain ADL: See Care Tool Section for some details of mobility and selfcare  Therapy/Group: Individual Therapy  Rigley Niess OTR/L 02/23/2020, 11:31 AM

## 2020-02-23 NOTE — Discharge Summary (Signed)
Physician Discharge Summary  Patient ID: [name]. MRN: [id number] DOB/AGE: 08/03/1941 79 y.o.  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   All channels were sampled at '256hz' .  This polysomnographic procedure is designed to evaluate (1) the complaint of excessive daytime sleepiness by quantifying the time required to fall asleep and (2) the possibility of narcolepsy by checking for abnormally short latencies to REM sleep.  Electrographic variables include EEG, EMG, EOG and ECG.  Patients are monitored throughout four or five 20-minute opportunities to sleep (naps) at two-hour intervals.  For each nap, the patient is allowed 20 minutes to fall asleep.  Once asleep, the patient is awakened after 15 minutes.  Between naps, the patient is kept as alert as possible.  A sleep latency of 20 minutes indicates that no sleep occurred. Parametric Analysis Total Number of Naps 5   NAP # Time of Nap  Sleep Latency (mins) REM Latency (mins) Sleep Time Percent Awake Time Percent 1 07:20  8.5 0 33 67  2 09:24 4 0 75  25  3 11:21 4.5 0 69  31  4 13:26 12 0 49  51  5 15:13 3 0 74  26  MSLT Summary of Naps Sleepiness Index: 68 Mean Sleep Latency to all Five Naps (in minutes) : 6.4 Mean Sleep Latency to First Four Naps: 7.25 Mean Sleep Latency to First Three Naps: 5.7 Mean Sleep Latency to First Two Naps: 6.25 Number of Naps with REM Sleep: 0 Results from Preceding PSG Study Sleep Onset Time 22:15 Sleep Efficiency (%) 91.1 Rise Time AM 05:35 Sleep Latency (min) 8 Total Sleep Time (min)  405.5 REM Latency (min) 44 I attest to having reviewed every epoch of the entire raw data recording prior to the issuance of this report in accordance with the Standards of the American Academy of Sleep Medicine.  Name:  Greogry, Goodwyn Reference #:  [id number] Study Date: 01/29/2020 DOB: 02/09/1941 IMPRESSION: 1. This multiple sleep latency test reveals a mean sleep latency of 6.4 minutes with a total of 5 sleep periods during which sleep was recorded.  No REM sleep periods were recorded.  2. This study was preceded by an overnight polysomnogram with a total sleep time (TST) of 405 minutes and REM sleep latency of 44 minutes.     RECOMMENDATIONS: This MSLT study indicates the presence of sleepiness. 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 This study is not consistent with a diagnosis of narcolepsy.  Larey Seat, M.D. Diplomat, Tax adviser of Psychiatry and Neurology Diplomat, Tax adviser of Sleep Medicine Market researcher, [employer] [facility] at [employer] PS : I will correlate the findings of  MSLT and toxicology screen and HLA narcolepsy panel test in my result note.   DG Chest 1 View  Result Date: 02/04/2020 CLINICAL DATA:  Fall EXAM: CHEST  1 VIEW COMPARISON:  05/15/2019 FINDINGS: The heart size and mediastinal contours are within normal limits. Both lungs are clear. The visualized skeletal structures are unremarkable. Median sternotomy and left chest wall pacemaker. IMPRESSION: No active disease.  Electronically Signed   By: Ulyses Jarred M.D.   On: 02/04/2020 00:37   DG Elbow 2 Views Right  Result Date: 02/04/2020 CLINICAL DATA:  Right elbow and hip pain after fall EXAM: RIGHT ELBOW - 2 VIEW COMPARISON:  None. FINDINGS: There is no evidence of fracture, dislocation, or joint effusion. There is no evidence of arthropathy or other focal bone abnormality. Soft tissues are unremarkable. IMPRESSION: Negative. Electronically Signed   By: Ulyses Jarred M.D.   On: 02/04/2020 00:29   CT Head Wo Contrast  Result Date: 02/04/2020 CLINICAL DATA:  Fall EXAM: CT HEAD WITHOUT CONTRAST CT CERVICAL SPINE WITHOUT CONTRAST TECHNIQUE: Multidetector CT imaging of the head and cervical spine was performed following the standard protocol without intravenous contrast. Multiplanar CT image reconstructions of the cervical spine were also generated. COMPARISON:  None. FINDINGS: CT HEAD FINDINGS Brain: There is no mass, hemorrhage or extra-axial collection. The size and configuration of the ventricles and extra-axial CSF spaces are normal. Areas of hypoattenuation of the deep gray nuclei and confluent periventricular white matter hypodensity, consistent with chronic small vessel disease. 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 Vascular: No abnormal hyperdensity of the major intracranial arteries or dural venous sinuses. No intracranial atherosclerosis. Unchanged calcification along the course of the left M2 segment. Skull: Remote right-sided craniotomy. Sinuses/Orbits: No fluid levels or advanced mucosal thickening of the visualized paranasal sinuses. No mastoid or middle ear effusion. The orbits are normal. CT CERVICAL SPINE FINDINGS Alignment: No static subluxation. Facets are aligned. Occipital condyles are normally positioned. Skull base and vertebrae: No acute fracture. Soft tissues and spinal canal: No prevertebral fluid or swelling. No visible canal hematoma. Disc levels: No bony spinal canal  stenosis. Multilevel mild vertebral body height loss, particularly at C3 and C4. Facet hypertrophy is worst at right C3-4 and left C4-5. Upper chest: No pneumothorax, pulmonary nodule or pleural effusion. Other: Normal visualized paraspinal cervical soft tissues. IMPRESSION: 1. Chronic ischemic microangiopathy without acute intracranial abnormality. 2. No acute fracture or static subluxation of the cervical spine. Electronically Signed   By: Ulyses Jarred M.D.   On: 02/04/2020 00:54   CT Cervical Spine Wo Contrast  Result Date: 02/04/2020 CLINICAL DATA:  Fall EXAM: CT HEAD WITHOUT CONTRAST CT CERVICAL SPINE WITHOUT CONTRAST TECHNIQUE: Multidetector CT imaging of the head and cervical spine was performed following the standard protocol without intravenous contrast. Multiplanar CT image reconstructions of the cervical spine were also generated. COMPARISON:  None. FINDINGS: CT HEAD FINDINGS Brain: There is no mass, hemorrhage or extra-axial collection. The size and configuration of the ventricles and extra-axial CSF spaces are normal. Areas of hypoattenuation of the deep gray nuclei and confluent periventricular white matter hypodensity, consistent with chronic small vessel disease. 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 Vascular: No abnormal hyperdensity of the major intracranial arteries or dural venous sinuses. No intracranial atherosclerosis. Unchanged calcification along the course of the left M2 segment. Skull: Remote right-sided craniotomy. Sinuses/Orbits: No fluid levels or advanced mucosal thickening of the visualized paranasal sinuses. No mastoid or middle ear effusion. The orbits are normal. CT  CERVICAL SPINE FINDINGS Alignment: No static subluxation. Facets are aligned. Occipital condyles are normally positioned. Skull base and vertebrae: No acute fracture. Soft tissues and spinal canal: No prevertebral fluid or swelling. No visible canal hematoma. Disc levels: No bony spinal canal  stenosis. Multilevel mild vertebral body height loss, particularly at C3 and C4. Facet hypertrophy is worst at right C3-4 and left C4-5. Upper chest: No pneumothorax, pulmonary nodule or pleural effusion. Other: Normal visualized paraspinal cervical soft tissues. IMPRESSION: 1. Chronic ischemic microangiopathy without acute intracranial abnormality. 2. No acute fracture or static subluxation of the cervical spine. Electronically Signed   By: Ulyses Jarred M.D.   On: 02/04/2020 00:54   DG Foot 2 Views Right  Result Date: 02/04/2020 CLINICAL DATA:  First and second toe infection for 1 week, initial encounter EXAM: RIGHT FOOT - 2 VIEW COMPARISON:  None. FINDINGS: Degenerative changes of the tarsal bones are noted. No definitive bony erosive changes are seen to suggest osteomyelitis. Mild soft tissue swelling is seen. No fractures are noted. IMPRESSION: No findings to suggest osteomyelitis. Electronically Signed   By: Inez Catalina M.D.   On: 02/04/2020 16:36   DG C-Arm 1-60 Min  Result Date: 02/04/2020 CLINICAL DATA:  ORIF proximal right femur fracture EXAM: DG C-ARM 1-60 MIN; RIGHT FEMUR 2 VIEWS COMPARISON:  02/04/2020 right hip radiographs FLUOROSCOPY TIME:  Fluoroscopy Time:  1 minutes 0 seconds Number of Acquired Spot Images: 4 FINDINGS: Multiple nondiagnostic spot fluoroscopic intraoperative right hip radiographs demonstrate transfixation of intertrochanteric proximal right femur fracture by intramedullary rod with interlocking proximal right femoral neck pin and distal interlocking screw. IMPRESSION: Intraoperative fluoroscopic guidance for ORIF intertrochanteric proximal right femur fracture. Electronically Signed   By: Ilona Sorrel M.D.   On: 02/04/2020 19:25   DG Hip Unilat W or Wo Pelvis 2-3 Views Right  Result Date: 02/04/2020 CLINICAL DATA:  Right elbow and hip pain after fall EXAM: DG HIP (WITH OR WITHOUT PELVIS) 2-3V RIGHT COMPARISON:  None. FINDINGS: Nondisplaced intertrochanteric fracture,  best seen on cross-table lateral view. No dislocation. No pelvic fracture. IMPRESSION: Nondisplaced right intertrochanteric fracture. Electronically Signed   By: Ulyses Jarred M.D.   On: 02/04/2020 00:34   DG FEMUR, MIN 2 VIEWS RIGHT  Result Date: 02/04/2020 CLINICAL DATA:  ORIF proximal right femur fracture EXAM: DG C-ARM 1-60 MIN; RIGHT FEMUR 2 VIEWS COMPARISON:  02/04/2020 right hip radiographs FLUOROSCOPY TIME:  Fluoroscopy Time:  1 minutes 0 seconds Number of Acquired Spot Images: 4 FINDINGS: Multiple nondiagnostic spot fluoroscopic intraoperative right hip radiographs demonstrate transfixation of intertrochanteric proximal right femur fracture by intramedullary rod with interlocking proximal right femoral neck pin and distal interlocking screw. IMPRESSION: Intraoperative fluoroscopic guidance for ORIF intertrochanteric proximal right femur fracture. Electronically Signed   By: Ilona Sorrel M.D.   On: 02/04/2020 19:25   VAS [place] LOWER EXTREMITY VENOUS (DVT)  Result Date: 02/10/2020  Lower Venous DVTStudy Indications: Swelling.  Comparison Study: No prior study. Performing Technologist: Maudry Mayhew MHA, RDMS, RVT, RDCS  Examination Guidelines: A complete evaluation includes B-mode imaging, spectral Doppler, color Doppler, and power Doppler as needed of all accessible portions of each vessel. Bilateral testing is considered an integral part of a complete examination. Limited examinations for reoccurring indications may be performed as noted. The reflux portion of the exam is performed with the patient in reverse Trendelenburg.  +---------+---------------+---------+-----------+----------+--------------+ RIGHT    CompressibilityPhasicitySpontaneityPropertiesThrombus Aging +---------+---------------+---------+-----------+----------+--------------+ CFV      Full  No       Yes                                 +---------+---------------+---------+-----------+----------+--------------+  SFJ      Full                                                        +---------+---------------+---------+-----------+----------+--------------+ FV Prox  Full                                                        +---------+---------------+---------+-----------+----------+--------------+ FV Mid   Full                                                        +---------+---------------+---------+-----------+----------+--------------+ FV DistalFull                                                        +---------+---------------+---------+-----------+----------+--------------+ PFV      Full                                                        +---------+---------------+---------+-----------+----------+--------------+ POP      Full           No       Yes                                 +---------+---------------+---------+-----------+----------+--------------+ PTV      Full                                                        +---------+---------------+---------+-----------+----------+--------------+ PERO     Full                                                        +---------+---------------+---------+-----------+----------+--------------+   +---------+---------------+---------+-----------+----------+--------------+ LEFT     CompressibilityPhasicitySpontaneityPropertiesThrombus Aging +---------+---------------+---------+-----------+----------+--------------+ CFV      Full           No       Yes                                 +---------+---------------+---------+-----------+----------+--------------+ SFJ  Full                                                        +---------+---------------+---------+-----------+----------+--------------+ FV Prox  Full                                                        +---------+---------------+---------+-----------+----------+--------------+ FV Mid   Full                                                         +---------+---------------+---------+-----------+----------+--------------+ FV DistalFull                                                        +---------+---------------+---------+-----------+----------+--------------+ PFV      Full                                                        +---------+---------------+---------+-----------+----------+--------------+ POP      Full           No       Yes                                 +---------+---------------+---------+-----------+----------+--------------+ PTV      Full                                                        +---------+---------------+---------+-----------+----------+--------------+ PERO     Full                                                        +---------+---------------+---------+-----------+----------+--------------+     Summary: RIGHT: - There is no evidence of deep vein thrombosis in the lower extremity.  - No cystic structure found in the popliteal fossa.  LEFT: - There is no evidence of deep vein thrombosis in the lower extremity.  - No cystic structure found in the popliteal fossa.  Pulsatile venous flow is suggestive of possible elevated right heart pressure.  *See table(s) above for measurements and observations. Electronically signed by Servando Snare MD on 02/10/2020 at 6:07:00 PM.    Final    Cpap titration  Result Date: 01/28/2020 Larey Seat, MD     02/11/2020  6:02 PM PATIENT'S NAME:  [name] DOB:  Oct 30, 1941     MR#:    [id number]    DATE OF RECORDING: 01/28/2020 REFERRING M.D.:  Butler Denmark, NP Study Performed:   Positive Airway Pressure Titration for MSLT to follow. HISTORY:  Mr. Maue is a 79 year old male with history of sleep apnea on CPAP, stroke, and memory disturbance.  He has complained of persistent daytime fatigue, easily falls asleep during the day.  He also has history of hallucinations.  He tried to move his Aricept dosing in the  morning, but that did not impact his hallucinations.  After his last visit, he was taken off Aricept.   His residual AHI was 1.6/h on autotitration capable CPAP with a pressure range of 6-16 cm water and 3 cm EPR, ResMed F 30 FFM . Due to high Epworth score (EDS), the question of Narcolepsy was raised. He was tested by HLA for narcolepsy trait, this was negative, thus leaving a low yield for the diagnosis.  MSLT was ordered. (The patient was supposingly instructed to wean off all tricyclica, SSRis, anticholinergic medications, any sedatives and stimulants in preparation and be off those medications for 3 weeks).  The patient endorsed the Epworth Sleepiness Scale at 24/24 points.  The patient's weight 187 pounds with a height of 68 (inches), resulting in a BMI of 28.4 kg/m2. The patient's neck circumference measured 17 inches. CURRENT MEDICATIONS: Zyloprim, Ferrous sulfate, Lasix, Lopressor, Myrbetriq, Protonix, Klor-con, Crestor, Zoloft, Flomax. The patient had been s asked to discontinue Zoloft and gabapentin.  PROCEDURE:  This is a multichannel digital polysomnogram utilizing the SomnoStar 11.2 system.  Electrodes and sensors were applied and monitored per AASM Specifications.   EEG, EOG, Chin and Limb EMG, were sampled at 200 Hz.  ECG, Snore and Nasal Pressure, Thermal Airflow, Respiratory Effort, CPAP Flow and Pressure, Oximetry was sampled at 50 Hz. Digital video and audio were recorded.    CPAP was initiated at 8 cmH20 with heated humidity per AASM split night standards and pressure was advanced to 13.0cmH20 because of hypopneas, apneas and desaturations.  At a PAP pressure of 13 cmH20, there was a reduction of the AHI to 0 with improvement of sleep apnea for a sleep time of 163 minutes total. Lights Out was at 22:10 and Lights On at 05:35. Total recording time (TRT) was 445 minutes, with a total sleep time (TST) of 405.5 minutes. The patient's sleep latency was 8 minutes. REM latency was 44 minutes.  The  sleep efficiency was 91.1 %.  SLEEP ARCHITECTURE: WASO (Wake after sleep onset)  was 35.5 minutes.  There were 21 minutes in Stage N1, 239 minutes Stage N2, 36.5 minutes Stage N3 and 109 minutes in Stage REM.  The percentage of Stage N1 was 5.2%, Stage N2 was 58.9%, Stage N3 was 9.% and Stage R (REM sleep) was 26.9%. RESPIRATORY ANALYSIS:  There was a total of 7 respiratory events: 0 obstructive apneas, 0 central apneas and 0 mixed apneas with a total of 0 apneas and an apnea index (AI) of 0 /hour. There were 7 hypopneas with a hypopnea index of 1./hour. The patient also had 0 respiratory event related arousals (RERAs).    The total APNEA/HYPOPNEA INDEX  (AHI) was 1. /hour and the total RESPIRATORY DISTURBANCE INDEX was 1. /hour  4 events occurred in REM sleep and 3 events in NREM. The REM AHI was 2.2 /hour versus a non-REM AHI of 0.6 /hour.  The patient spent 405.5 minutes of total sleep time in the supine position and 0 minutes in  non-supine. The supine AHI was 1.0, versus a non-supine AHI of 0.0. OXYGEN SATURATION & C02:  The baseline 02 saturation was 98%, with the lowest being 92%. Time spent below 89% saturation equaled 0 minutes. The arousals were noted as: 30 were spontaneous, 2 were associated with PLMs, 3 were associated with respiratory events. The patient had a total of 33 Periodic Limb Movements. The Periodic Limb Movement (PLM) index was 4.9 and the PLM Arousal index was 0.3 /hour. Audio and video analysis did not show any abnormal or unusual movements, behaviors, phonations or vocalizations.  Snoring was controlled. One episode of sleep talking. EKG was in keeping with sinus rhythm and documented twice sinus tachycardia, paced rhythm. DIAGNOSIS 1. Obstructive Sleep Apnea well controlled on CPAP in a setting comparable to his home machine. 2. Paced EKG. 3. High sleep efficiency 4. Normal REM sleep latency.  PLANS/RECOMMENDATIONS: valid study for MSLT to follow. Tox screen will be obtained at  [place]. A follow up appointment will be scheduled in the Sleep Clinic at [facility].   Please call [phone] with any questions.   I certify that I have reviewed the entire raw data recording prior to the issuance of this report in accordance with the Standards of Accreditation of the American Academy of Sleep Medicine (AASM) Larey Seat, M.D. Diplomat, Tax adviser of Psychiatry and Neurology Diplomat, Tax adviser of Sleep Medicine Market researcher, [employer] [facility] at [employer]:  Basic Metabolic Panel: Recent [employer] 02/22/20 2327  CREATININE 1.66*    CBC: No results for input(s): WBC, NEUTROABS, HGB, HCT, MCV, PLT in the last 168 hours.  CBG: No results for input(s): GLUCAP in the last 168 hours.  Family history.  Mother and father with hypertension hyperlipidemia as well as diabetes mellitus.  Denies any colon cancer rectal cancer esophageal cancer  Brief HPI:   Nicklous Stormy Sabol. is a 79 y.o. 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  Patient received inpatient rehab services June 2020 for debilitation related to CHF.  Per chart review lives with spouse independent with assistive device.  Presented 02/04/2020 after ground-level fall without loss of consciousness sustaining a right intertrochanteric femur fracture as well as undisplaced fracture involving the lamina bilaterally at C6.  Underwent closed intramedullary nailing of right femur 02/04/2020 per Dr. 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 [name].  Patient partial weightbearing 25%  right lower extremity.  Placed conservatively in a cervical collar for undisplaced fracture lamina of C6.  Hospital course acute blood loss anemia 9.2 monitoring of blood pressure follow-up cardiology services bouts of CHF felt to be euvolemic home Demadex and Lasix initially held.  Lovenox later added for DVT prophylaxis.  Patient was admitted for a comprehensive rehab program.  Hospital Course: Asaiah Travonne Schowalter. was admitted to rehab 02/09/2020 for inpatient therapies to consist of PT, ST and OT at least three hours five days a week. Past admission physiatrist, therapy team and rehab RN have worked together to provide customized collaborative inpatient rehab.  Pertaining to patient's right intertrochanteric femur fracture IM nailing 02/04/2020 right second toe amputation for osteomyelitis 02/06/2020.  Neurovascular sensation intact 25% partial weightbearing follow-up  with orthopedic services.  Lovenox for DVT prophylaxis no bleeding episodes.  Pain managed with use of hydrocodone and Robaxin as needed.  Mood stabilization with Zoloft patient was attending full therapies.  Acute on chronic anemia follow-up per Dr. [name] with follow-up hematology services and continued iron supplement.  Conservative care of undisplaced fracture lamina bilaterally of C6.  Follow-up scan 2/17 healed.  Chronic diastolic congestive heart failure follow-up per Dr. [name] patient currently maintained on low-dose Demadex and blood pressure overall controlled on Toprol.  Crestor ongoing for hyperlipidemia.  History of BPH continued on Flomax.   Blood pressures were monitored on TID basis and controlled  Diabetes has been monitored with ac/hs CBG checks and SSI was use prn for tighter BS control.   He/ is continent of bowel and bladder.  He/ has made gains during rehab stay and is attending therapies  He/ will continue to receive follow up therapies   after discharge  Rehab course: During patient's stay in rehab weekly team  conferences were held to monitor patient's progress, set goals and discuss barriers to discharge. At admission, patient required moderate assist supine to sit, minimal assist sit to supine, minimal assist 18 feet rolling walker..  Minimal assist upper body bathing moderate assist upper body dressing max is lower body dressing minimal assist toilet transfers  Physical exam.  Blood pressure 99/60 pulse 114 temperature 98.9 respirations 17 oxygen saturation 93% room air General.  No acute distress mood and affect appropriate HEENT Head.  Normocephalic and atraumatic Eyes.  Pupils round and reactive to light no discharge without nystagmus Neck.  Supple nontender no JVD without thyromegaly Cardiac regular rate rhythm without any extra sounds or murmur heard Lungs.  Clear to auscultation without wheeze or rails Abdomen.  Soft nontender positive bowel sounds without rebound Neurological.  Cranial nerves II through XII intact motor strength 5 out of 5 bilateral deltoids biceps triceps grip 4 - bilateral hip flexors, knee extensors 4 - left ankle dorsiflexion plantarflexion.  Right ankle cannot be tested secondary to dressing.   He/  has had improvement in activity tolerance, balance, postural control as well as ability to compensate for deficits. He/ has had improvement in functional use RUE/LUE  and RLE/LLE as well as improvement in awareness.  Performed sit to stand rolling walker supervision ambulating to the room in gymnasium 200 feet with the use of alarm for weightbearing.  Up-and-down 6 inch stairs rolling walker minimal assist with weightbearing status.  Performed stand pivot to wheelchair with supervision contact-guard using rolling walker.  Educated patient and spouse on simulated walking and shower transfer stepping and posteriorly.  Discussed the need for him to complete all bathing and drying off on the 3 and 1 piece of equipment.  He needed minimal guard for transfers to the bathroom with the use  of the force guard weightbearing device.  Full family teaching completed plan discharge to home       Disposition: Discharged to home    Diet: Diabetic diet  Special Instructions: No driving smoking or alcohol  Partial weightbearing right lower extremity  Medications at discharge 1.  Tylenol as needed 2.  Colace 100 mg p.o. 3 times daily 3.  Ferrous sulfate 325 mg p.o. daily 4.  Hydrocodone 1 to 2 tablets every 4 hours as needed pain 5.  Robaxin 500 mg every 6 hours as needed muscle spasms 6.  Toprol-XL 12.5 mg p.o. daily 7.Myrbetriq 50 mg p.o. daily 8.  Multivitamin daily 9.  Potassium chloride  20 mg p.o. daily 10.  Crestor 40 mg p.o. daily 11.  Flomax 0.4 mg p.o. nightly 12.  Demadex 10 mg p.o. daily   Follow-up Information    Kirsteins, Luanna Salk, MD Follow up.   Specialty: Physical Medicine and Rehabilitation Why: No Follow up needed Contact information: [place] [address] [phone]        [name], MD Follow up.   Specialty: Cardiology Why: Call for appointment Contact information: [address] [place] [address] [phone]        Newt Minion, MD Follow up.   Specialty: Orthopedic Surgery Why: Call for appointment Contact information: [place] [address] [phone]        Netta Cedars, MD Follow up.   Specialty: Orthopedic Surgery Why: Call for appointment Contact information: [address] [place] [address] [phone]           Signed: Cathlyn Parsons 02/25/2020, 5:16 AM

## 2020-02-23 NOTE — Progress Notes (Addendum)
Physical Therapy Discharge Summary  Patient Details  Name: [name]. MRN: [id number] Date of Birth: May 10, 1941  Today's Date: 02/24/2020 PT Individual Time: 0901-1014 PT Individual Time Calculation (min): 73 min     Patient has met 10 of 10 long term goals due to improved activity tolerance, improved balance, increased strength, increased range of motion and decreased pain.  Patient to discharge at an ambulatory level Supervision.   Patient's care partner is independent to provide the necessary physical assistance at discharge.  Reasons goals not met: N/A  Recommendation:  Patient will benefit from ongoing skilled PT services in home health setting to continue to advance safe functional mobility, address ongoing impairments in strength, balance, gait, stairs, and minimize fall risk.  Equipment: 18x18 w/c with elevating leg rests and RW  Reasons for discharge: treatment goals met  Patient/family agrees with progress made and goals achieved: Yes  Skilled Physical Therapy Interventions Patient seated in w/c at sink with RN present upon PT arrival, agreeable to therapy tx, denies pain. Therapist donned WB shoe totalA. Sit > standing from w/c > RW with supervision, pt ambulated 150' with supervision, stand > sit to w/c with supervision, therapist transported pt to ortho gym for energy conservation. Pt performed car transfer with supervision, cueing for sequencing. Sit > stand from car > RW with minA d/t low car seat height (pt's car is a medium ht car therefore no concerns). Pt ambulated 10' up/down ramp with RW and minA for RW management and cues for positioning in RW and shorter step length for safety. Pt transported to rehab apartment for energy conservation. Sit > stand at RW > ambulated 5' to recliner > stand > sit with supervision and therapist blocking recliner from rocking back and forth. While pt seated in recliner, therapist performed sensation testing (see below). Sit > stand  from recliner to RW with minA d/t low seat ht, stand pivot transfer to bed with RW with supervision. Stand > sitting on EOB > supine > rolling R/L > supine > sit with supervision only. With pt sitting EOB, therapist performed strength assessment (see below). Pt transported to rehab gym, therapist further discussed discharge planning with pt. Pt ascended/descended 4 steps with RW and CGA from therapist with second helper to stand in front of pt and steady RW for safety, pt giving verbal instructions to therapist and verbalizing each step prior to movement. Therapist emphasized importance of only performing stair negotiation with 2 helpers present. Stand pivot transfer back to w/c with supervision. Pt performed w/c propulsion independently 200' back to room. Pt left seated in w/c with needs in reach and chair alarm set.    PT Discharge Precautions/Restrictions Precautions Precautions: Fall;Other (comment) Precaution Comments: Partial WB 25% RLE Restrictions Weight Bearing Restrictions: Yes RLE Weight Bearing: Partial weight bearing RLE Partial Weight Bearing Percentage or Pounds: 25% Cognition Overall Cognitive Status: Within Functional Limits for tasks assessed Arousal/Alertness: Awake/alert Orientation Level: Oriented X4 Attention: Sustained Sustained Attention: Appears intact Memory: Appears intact Awareness: Appears intact Problem Solving: Appears intact Safety/Judgment: Impaired Comments: requires cues for PWB, positioning in RW, and sequencing with stair negotiation Sensation Sensation Light Touch: Impaired by gross assessment Proprioception: Impaired by gross assessment Additional Comments: sensation intact in BLE, RLE dec distally to light touch compared to LLE Coordination Gross Motor Movements are Fluid and Coordinated: Yes(LLE WFL for functional tasks, RLE PWB 25% unable to asssess formally WFL throughout functional mobility) Fine Motor Movements are Fluid and Coordinated:  Yes Motor  Motor  Motor: Within Functional Limits Motor - Discharge Observations: generalized weakness with limitations in mobility 2/2 to PWB 25% RLE  Mobility Bed Mobility Bed Mobility: Rolling Right;Rolling Left;Supine to Sit;Sit to Supine Rolling Right: Independent Rolling Left: Independent Left Sidelying to Sit: Independent Supine to Sit: Independent Sit to Supine: Independent Transfers Transfers: Sit to Stand;Stand to Sit;Stand Pivot Transfers Sit to Stand: Supervision/Verbal cueing Stand Pivot Transfers: Supervision/Verbal cueing Transfer (Assistive device): Rolling walker Locomotion  Gait Ambulation: Yes Gait Assistance: Supervision/Verbal cueing Gait Distance (Feet): 150 Feet Assistive device: Rolling walker Gait Assistance Details: Verbal cues for technique;Verbal cues for precautions/safety;Verbal cues for sequencing;Verbal cues for safe use of DME/AE;Verbal cues for gait pattern Gait Gait: Yes Gait Pattern: Impaired Gait Pattern: Step-to pattern;Decreased step length - left;Decreased step length - right;Decreased stance time - right;Decreased stance time - left;Decreased stride length;Decreased hip/knee flexion - left;Antalgic;Trunk flexed Gait velocity: decreased Stairs / Additional Locomotion Stairs: Yes Stairs Assistance: Contact Guard/Touching assist Stair Management Technique: Two rails Number of Stairs: 4 Height of Stairs: 6 Wheelchair Mobility Wheelchair Mobility: Yes Wheelchair Assistance: Chartered loss adjuster: Both upper extremities Wheelchair Parts Management: Supervision/cueing Distance: 150'  Trunk/Postural Assessment  Cervical Assessment Cervical Assessment: Exceptions to WFL(forward head posture) Thoracic Assessment Thoracic Assessment: Exceptions to WFL(mild kyphosis, rounded shoulders) Lumbar Assessment Lumbar Assessment: Exceptions to WFL(posterior pelvic tilt in sitting) Postural Control Postural Control: Within  Functional Limits  Balance Balance Balance Assessed: Yes Static Sitting Balance Static Sitting - Level of Assistance: 7: Independent Dynamic Sitting Balance Dynamic Sitting - Level of Assistance: 6: Modified independent (Device/Increase time) Sitting balance - Comments: pt able to sit EOB without assist, uses reacher when sitting and needing something out of reach Static Standing Balance Static Standing - Level of Assistance: 5: Stand by assistance Dynamic Standing Balance Dynamic Standing - Level of Assistance: 5: Stand by assistance Extremity Assessment  RLE Assessment RLE Assessment: Exceptions to [facility] Passive Range of Motion (PROM) Comments: WFL Active Range of Motion (AROM) Comments: WFL except for hip extension noted in standing pt able to achieve neutral then using compensatory anterior trunk leaning General Strength Comments: RLE globally weak 2/2 PWB restrictions RLE Strength Right Hip Flexion: 3+/5 Right Knee Flexion: 4/5 Right Knee Extension: 4/5 Right Ankle Dorsiflexion: 4-/5 Right Ankle Plantar Flexion: 4/5 LLE Assessment LLE Assessment: Exceptions to [facility] Passive Range of Motion (PROM) Comments: WFL Active Range of Motion (AROM) Comments: WFL LLE Strength Left Hip Flexion: 4/5 Left Knee Flexion: 4+/5 Left Knee Extension: 4+/5 Left Ankle Dorsiflexion: 4/5 Left Ankle Plantar Flexion: 4/5    Onis Markoff SPT 02/24/2020, 12:16 PM

## 2020-02-23 NOTE — Progress Notes (Signed)
[place] PHYSICAL MEDICINE & REHABILITATION PROGRESS NOTE   Subjective/Complaints:     ROS: Patient deniesCP, SOB, N/V/D Objective:   No results found. No results for input(s): WBC, HGB, HCT, PLT in the last 72 hours. Recent Labs    02/22/20 2327  CREATININE 1.66*    Intake/Output Summary (Last 24 hours) at 02/23/2020 1036 Last data filed at 02/23/2020 0817 Gross per 24 hour  Intake 950 ml  Output 925 ml  Net 25 ml     Physical Exam: Vital Signs Blood pressure 113/80, pulse 99, temperature 97.6 F (36.4 C), temperature source Oral, resp. rate 17, height 5\' 7"  (1.702 m), weight 76.9 kg, SpO2 100 %.  General: No acute distress Mood and affect are appropriate Heart: Regular rate and rhythm no rubs murmurs or extra sounds Lungs: Clear to auscultation, breathing unlabored, no rales or wheezes Abdomen: Positive bowel sounds, soft nontender to palpation, nondistended Extremities: No clubbing, cyanosis, or edema Skin: Dressing over R 2nd toe amp site   Neurologic: Cranial nerves II through XII intact, motor strength is 5/5 in bilateral deltoid, bicep, tricep, grip,LEFT  hip flexor, knee extensors, ankle dorsiflexor and plantar flexor   3-4HF, KE, ADF, right leg limited by pain Sensory exam reduced LT sensation below the ankles bilaterally in stocking glove pattern  Musculoskeletal: reduced Right hip ROM, no evidence of knee effusion bilaterally.  Psych: Pleasant disposition.   Assessment/Plan: 1. Functional deficits secondary to Right IT hip fx  which require 3+ hours per day of interdisciplinary therapy in a comprehensive inpatient rehab setting.  Physiatrist is providing close team supervision and 24 hour management of active medical problems listed below.  Physiatrist and rehab team continue to assess barriers to discharge/monitor patient progress toward functional and medical goals  Care Tool:  Bathing    Body parts bathed by patient: Right arm, Left arm, Chest,  Abdomen, Right upper leg, Left upper leg, Face, Front perineal area, Buttocks, Left lower leg   Body parts bathed by helper: Left lower leg Body parts n/a: Right lower leg   Bathing assist Assist Level: Minimal Assistance - Patient > 75%     Upper Body Dressing/Undressing Upper body dressing   What is the patient wearing?: Pull over shirt    Upper body assist Assist Level: Supervision/Verbal cueing    Lower Body Dressing/Undressing Lower body dressing      What is the patient wearing?: Pants, Incontinence brief     Lower body assist Assist for lower body dressing: Minimal Assistance - Patient > 75%     Toileting Toileting    Toileting assist Assist for toileting: Minimal Assistance - Patient > 75%     Transfers Chair/bed transfer  Transfers assist     Chair/bed transfer assist level: Supervision/Verbal cueing     Locomotion Ambulation   Ambulation assist      Assist level: Contact Guard/Touching assist Assistive device: Walker-rolling Max distance: 150'   Walk 10 feet activity   Assist     Assist level: Contact Guard/Touching assist Assistive device: Walker-rolling   Walk 50 feet activity   Assist Walk 50 feet with 2 turns activity did not occur: Safety/medical concerns(2/2 pain in RLE and pt fatigue)  Assist level: Contact Guard/Touching assist Assistive device: Walker-rolling    Walk 150 feet activity   Assist Walk 150 feet activity did not occur: Safety/medical concerns  Assist level: Contact Guard/Touching assist Assistive device: Walker-rolling    Walk 10 feet on uneven surface  activity   Assist Walk  10 feet on uneven surfaces activity did not occur: Safety/medical concerns         Wheelchair     Assist   Type of Wheelchair: Manual    Wheelchair assist level: Supervision/Verbal cueing Max wheelchair distance: 150'    Wheelchair 50 feet with 2 turns activity    Assist        Assist Level:  Supervision/Verbal cueing   Wheelchair 150 feet activity     Assist      Assist Level: Supervision/Verbal cueing   Blood pressure 113/80, pulse 99, temperature 97.6 F (36.4 C), temperature source Oral, resp. rate 17, height 5\' 7"  (1.702 m), weight 76.9 kg, SpO2 100 %.  Medical Problem List and Plan:  1.  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  Partial weightbearing 25% right lower extremity Rehab evals to day ,              -patient may shower with wrapping over right foot and ankle             -ELOS planned for 3/10 , family teaching in progress  -Continue PT and OT  2.  Antithrombotics:  -DVT/anticoagulation: Lovenox.  Normal  vascular study              -antiplatelet therapy: N/A  3. Pain Management: Hydrocodone and Robaxin as needed. Well controlled.   4. Mood: Zoloft 200 mg daily              -antipsychotic agents: N/A  5. Neuropsych: This patient is capable of making decisions on his own behalf.  6. 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 Fluids/Electrolytes/Nutrition: Routine in and outs with follow-up chemistries, elevated BUN, encourage fluids . Adequate intake   8.  Acute on chronic anemia.  Followed by Dr. [name]. continue iron supplement.  Follow-up CBC  9.  Undisplaced fracture lamina bilaterally of C6.  healed as of CT 2/17   10.  Chronic diastolic congestive heart failure.  Followed by Dr. [name] monitor for any signs of fluid overload.  Patient currently euvolemic no edema home Demadex Lasix on hold resume in 1 to 2 days.  Daily weights ordered; not being performed; reordered 3/1.   3/4-3/5: weight decreasing.   3/6: slightly increased Filed Weights   02/21/20 0449  02/22/20 0523 02/23/20 0425  Weight: 77.5 kg 77.3 kg 76.9 kg    Stable 3/8 11.  History of atrial fibrillation.  Coumadin in the past discontinued 2014 due to spontaneous subdural hematoma with craniotomy.  Toprol 12.5 mg 12.5 mg  Daily. Cardiac rate controlled Vitals:   02/22/20 1924 02/23/20 0425  BP: 108/75 113/80  Pulse: 100 99  Resp: 16 17  Temp: 98.1 F (36.7 C) 97.6 F (36.4 C)  SpO2: 94% 100%  controlled 3/8  12.  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  Following chemistries  13.  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  No chest pain or shortness of breath  14.  Diet-controlled diabetes mellitus..  CBGs discontinued  15.  BPH.  Flomax 0.4 mg nightly,Myrbetriq 50 mg daily.  Check PVR  16.  Hyperlipidemia.  Crestor 40 mg daily  17.  OSA/pulmonary fibrosis.  CPAP.  Patient has been refusing at times.  18. Constipation: Will  increase Colace to TID. 3/2: Will add Senna HS. Well controlled with this regimen.     LOS: 14 days A FACE TO FACE EVALUATION WAS PERFORMED  Charlett Blake 02/23/2020, 10:36 AM

## 2020-02-24 ENCOUNTER — Inpatient Hospital Stay (HOSPITAL_COMMUNITY): Payer: Medicare Other

## 2020-02-24 ENCOUNTER — Inpatient Hospital Stay (HOSPITAL_COMMUNITY): Payer: Medicare Other | Admitting: Physical Therapy

## 2020-02-24 ENCOUNTER — Inpatient Hospital Stay (HOSPITAL_COMMUNITY): Payer: Medicare Other | Admitting: Occupational Therapy

## 2020-02-24 MED ORDER — MIRABEGRON ER 50 MG PO TB24: 50.0000 mg | ORAL_TABLET | Freq: Every day | ORAL | 3 refills | Status: AC

## 2020-02-24 MED ORDER — METHOCARBAMOL 500 MG PO TABS: 500.0000 mg | ORAL_TABLET | Freq: Four times a day (QID) | ORAL | 0 refills | Status: DC | PRN

## 2020-02-24 MED ORDER — TORSEMIDE 10 MG PO TABS: 10.0000 mg | ORAL_TABLET | Freq: Every day | ORAL | 0 refills | Status: DC

## 2020-02-24 MED ORDER — SERTRALINE HCL 100 MG PO TABS: 200.0000 mg | ORAL_TABLET | Freq: Every day | ORAL | 0 refills | Status: DC

## 2020-02-24 MED ORDER — ROSUVASTATIN CALCIUM 40 MG PO TABS: 40.0000 mg | ORAL_TABLET | Freq: Every day | ORAL | 2 refills | Status: AC

## 2020-02-24 MED ORDER — TAMSULOSIN HCL 0.4 MG PO CAPS: 0.4000 mg | ORAL_CAPSULE | Freq: Every day | ORAL | 1 refills | Status: AC

## 2020-02-24 MED ORDER — METOPROLOL SUCCINATE ER 25 MG PO TB24: 12.5000 mg | ORAL_TABLET | Freq: Every day | ORAL | 0 refills | Status: DC

## 2020-02-24 MED ORDER — FERROUS SULFATE 325 (65 FE) MG PO TABS: 325.0000 mg | ORAL_TABLET | Freq: Every day | ORAL | 3 refills | Status: AC

## 2020-02-24 MED ORDER — PANTOPRAZOLE SODIUM 40 MG PO TBEC: 40.0000 mg | DELAYED_RELEASE_TABLET | Freq: Every day | ORAL | 0 refills | Status: AC

## 2020-02-24 MED ORDER — ALLOPURINOL 300 MG PO TABS: 300.0000 mg | ORAL_TABLET | Freq: Every day | ORAL | 0 refills | Status: AC

## 2020-02-24 MED ORDER — ACETAMINOPHEN 325 MG PO TABS: 325.0000 mg | ORAL_TABLET | ORAL | Status: DC | PRN

## 2020-02-24 MED ORDER — HYDROCODONE-ACETAMINOPHEN 5-325 MG PO TABS: 1.0000 | ORAL_TABLET | ORAL | 0 refills | Status: DC | PRN

## 2020-02-24 NOTE — Progress Notes (Signed)
[place] PHYSICAL MEDICINE & REHABILITATION PROGRESS NOTE   Subjective/Complaints:  aksing about d/c in am .  Has WC van [employer] and will use this to get home     ROS: Patient denies CP, SOB, N/V/D Objective:   No results found. No results for input(s): WBC, HGB, HCT, PLT in the last 72 hours. Recent Labs    02/22/20 2327  CREATININE 1.66*    Intake/Output Summary (Last 24 hours) at 02/24/2020 0810 Last data filed at 02/24/2020 0500 Gross per 24 hour  Intake 500 ml  Output 1275 ml  Net -775 ml     Physical Exam: Vital Signs Blood pressure 115/75, pulse (!) 59, temperature 97.6 F (36.4 C), temperature source Oral, resp. rate 16, height 5\' 7"  (1.702 m), weight 76.6 kg, SpO2 98 %.   General: No acute distress Mood and affect are appropriate Heart: Regular rate and rhythm no rubs murmurs or extra sounds Lungs: Clear to auscultation, breathing unlabored, no rales or wheezes Abdomen: Positive bowel sounds, soft nontender to palpation, nondistended Extremities: No clubbing, cyanosis, or edema Neurologic: Cranial nerves II through XII intact, motor strength is 5/5 in bilateral deltoid, bicep, tricep, grip,LEFT  hip flexor, knee extensors, ankle dorsiflexor and plantar flexor   3-4HF, KE, ADF, right leg limited by pain Sensory exam reduced LT sensation below the ankles bilaterally in stocking glove pattern  Musculoskeletal: reduced Right hip ROM, no evidence of knee effusion bilaterally.  Psych: Pleasant disposition.   Assessment/Plan: 1. Functional deficits secondary to Right IT hip fx  which require 3+ hours per day of interdisciplinary therapy in a comprehensive inpatient rehab setting.  Physiatrist is providing close team supervision and 24 hour management of active medical problems listed below.  Physiatrist and rehab team continue to assess barriers to discharge/monitor patient progress toward functional and medical goals  Care Tool:  Bathing    Body  parts bathed by patient: Right arm, Left arm, Chest, Abdomen, Right upper leg, Left upper leg, Face, Front perineal area, Buttocks, Left lower leg   Body parts bathed by helper: Left lower leg Body parts n/a: Right lower leg   Bathing assist Assist Level: Minimal Assistance - Patient > 75%     Upper Body Dressing/Undressing Upper body dressing   What is the patient wearing?: Pull over shirt    Upper body assist Assist Level: Supervision/Verbal cueing    Lower Body Dressing/Undressing Lower body dressing      What is the patient wearing?: Pants, Incontinence brief     Lower body assist Assist for lower body dressing: Minimal Assistance - Patient > 75%     Toileting Toileting    Toileting assist Assist for toileting: Minimal Assistance - Patient > 75%     Transfers Chair/bed transfer  Transfers assist     Chair/bed transfer assist level: Supervision/Verbal cueing     Locomotion Ambulation   Ambulation assist      Assist level: Supervision/Verbal cueing Assistive device: Walker-rolling Max distance: 150'   Walk 10 feet activity   Assist     Assist level: Contact Guard/Touching assist Assistive device: Walker-rolling   Walk 50 feet activity   Assist Walk 50 feet with 2 turns activity did not occur: Safety/medical concerns(2/2 pain in RLE and pt fatigue)  Assist level: Contact Guard/Touching assist Assistive device: Walker-rolling    Walk 150 feet activity   Assist Walk 150 feet activity did not occur: Safety/medical concerns  Assist level: Contact Guard/Touching assist Assistive device: Walker-rolling  Walk 10 feet on uneven surface  activity   Assist Walk 10 feet on uneven surfaces activity did not occur: Safety/medical concerns         Wheelchair     Assist   Type of Wheelchair: Manual    Wheelchair assist level: Supervision/Verbal cueing Max wheelchair distance: 150'    Wheelchair 50 feet with 2 turns  activity    Assist        Assist Level: Supervision/Verbal cueing   Wheelchair 150 feet activity     Assist      Assist Level: Supervision/Verbal cueing   Blood pressure 115/75, pulse (!) 59, temperature 97.6 F (36.4 C), temperature source Oral, resp. rate 16, height 5\' 7"  (1.702 m), weight 76.6 kg, SpO2 98 %.  Medical Problem List and Plan:  1.  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  Partial weightbearing 25% right lower extremity Rehab evals to day ,              -patient may shower with wrapping over right foot and ankle             -ELOS planned for 3/10 , family teaching in progress  -Continue PT and OT  2.  Antithrombotics:  -DVT/anticoagulation: Lovenox.  Normal  vascular study              -antiplatelet therapy: N/A  3. Pain Management: Hydrocodone and Robaxin as needed. Well controlled.   4. Mood: Zoloft 200 mg daily              -antipsychotic agents: N/A  5. Neuropsych: This patient is capable of making decisions on his own behalf.  6. 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 Fluids/Electrolytes/Nutrition: Routine in and outs with follow-up chemistries, elevated BUN, encourage fluids . Adequate intake   8.  Acute on chronic anemia.  Followed by Dr. [name]. continue iron supplement.  Follow-up CBC  9.  Undisplaced fracture lamina bilaterally of C6.  healed as of CT 2/17   10.  Chronic diastolic congestive heart failure.  Followed by Dr. [name] monitor for any signs of fluid overload.  Patient currently euvolemic no edema home Demadex Lasix on hold resume in 1 to 2 days.  Daily weights ordered; not being performed; reordered 3/1.   3/4-3/5: weight decreasing.    3/6: slightly increased Filed Weights   02/22/20 0523 02/23/20 0425 02/24/20 0504  Weight: 77.3 kg 76.9 kg 76.6 kg    Stable 3/9 11.  History of atrial fibrillation.  Coumadin in the past discontinued 2014 due to spontaneous subdural hematoma with craniotomy.  Toprol 12.5 mg 12.5 mg  Daily. Cardiac rate controlled Vitals:   02/23/20 1919 02/24/20 0504  BP: 105/78 115/75  Pulse: (!) 102 (!) 59  Resp: 18 16  Temp: 98.4 F (36.9 C) 97.6 F (36.4 C)  SpO2: 100% 98%  controlled 3/9  12.  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  Following chemistries  13.  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  No chest pain or shortness of breath  14.  Diet-controlled diabetes mellitus..  CBGs discontinued  15.  BPH.  Flomax 0.4 mg nightly,Myrbetriq 50 mg daily.  Check PVR  16.  Hyperlipidemia.  Crestor 40 mg daily  17.  OSA/pulmonary fibrosis.  CPAP.  Patient has been refusing at times.  18. Constipation: Will increase Colace to TID. 3/2: Will add Senna HS. Well controlled with this regimen.     LOS: 15 days A FACE TO FACE EVALUATION WAS PERFORMED  Charlett Blake 02/24/2020, 8:10 AM

## 2020-02-24 NOTE — Progress Notes (Signed)
Physical Therapy Session Note  Patient Details  Name: [name]. MRN: [id number] Date of Birth: 1941/08/30  Today's Date: 02/24/2020 PT Individual Time: 1118-1212 PT Individual Time Calculation (min): 54 min   Short Term Goals: Week 2:  PT Short Term Goal 1 (Week 2): STG = LTG d/t ELOS  Skilled Therapeutic Interventions/Progress Updates: Pt presented in w/c agreeable to therapy. Pt denies pain during session. Pt ambulated to rehab gym with use of wt loading boot with pt allowing alarm to beep x 3 times. Noted that rehab gym was full and transported to day room. Pt participated in NuStep L2 x 8 min for global conditioning and use of boot to maintain PWB status. Pt ambulated to high/low mat and participated in seated and supine therex including LAQ 2 x 10, heel slides 3 x 10 and SLR 2 x 10. Performed sit to/from supine with modI from flat mat. Pt indicated urgency for urinary void. Performed ambulatory transfer to w/c and transported back to room for time management. Performed ambulatory transfer to toilet with supervision toilet transfers (+void) and mod I for peri-care. Pt ambulated to sink for hand hygiene and returned to w/c. Pt left in w/c at end of session with belt alarm placed, call bell within reach and needs met.      Therapy Documentation Precautions:  Precautions Precautions: Fall, Other (comment) Precaution Comments: Partial WB 25% RLE Required Braces or Orthoses: Cervical Brace Cervical Brace: Hard collar, For comfort Restrictions Weight Bearing Restrictions: Yes RLE Weight Bearing: Partial weight bearing RLE Partial Weight Bearing Percentage or Pounds: 25% General:   Vital Signs: Therapy Vitals Temp: 97.7 F (36.5 C) Pulse Rate: 60 Resp: 19 BP: (!) 99/55 Patient Position (if appropriate): Sitting Oxygen Therapy SpO2: 100 % O2 Device: Room Air Pain: Pain Assessment Pain Score: 0-No pain   Therapy/Group: Individual Therapy  Junelle Hashemi  Minka Knight, PTA  02/24/2020, 4:35 PM

## 2020-02-24 NOTE — Care Management (Signed)
   The overall goal for the admission was met for:   Discharge location: Home with wife  Length of Stay: 16 days with discharge 02/25/20  Discharge activity level: Min guard for the transfer into the bathroom with use of the forceguard weightbearing device on the right foot to monitor weightbearing.  Pt ambulated 40' with RW to steps with CGA. Pt ascended/descended 4 steps with step-to pattern B rails and CGA-minA with cues for sequencing.   Home/community participation: Clinical research associate provided included: MD, RD, PT, OT, SLP, RN, CM, TR, Pharmacy, Neuropsych and SW  Financial Services: Medicare  Follow-up services arranged: Home Health: PT, OT from [facility], DME: W/C and RW from [facility] and Patient/Family has no preference for HH/DME agencies  Comments (or additional information): [facility] [phone] RW from [place]  Patient/Family verbalized understanding of follow-up arrangements: Yes  Individual responsible for coordination of the follow-up plan: Wife [name] [phone]  Confirmed correct DME delivered: [name] 02/24/2020    [name]

## 2020-02-24 NOTE — Plan of Care (Signed)
  Problem: Consults Goal: RH GENERAL PATIENT EDUCATION Description: See Patient Education module for education specifics. Outcome: Progressing   Problem: RH BLADDER ELIMINATION Goal: RH STG MANAGE BLADDER WITH ASSISTANCE Description: STG Manage Bladder With Min Assistance Outcome: Progressing   Problem: RH SKIN INTEGRITY Goal: RH STG ABLE TO PERFORM INCISION/WOUND CARE W/ASSISTANCE Description: STG Able To Perform Incision/Wound Care With Assistance. Min Outcome: Progressing   Problem: RH SAFETY Goal: RH STG ADHERE TO SAFETY PRECAUTIONS W/ASSISTANCE/DEVICE Description: STG Adhere to Safety Precautions With Assistance/Device. MIn Outcome: Progressing   Problem: RH PAIN MANAGEMENT Goal: RH STG PAIN MANAGED AT OR BELOW PT'S PAIN GOAL Description: Less than 3 Outcome: Progressing

## 2020-02-24 NOTE — Progress Notes (Signed)
Occupational Therapy Discharge Summary  Patient Details  Name: [name]. MRN: [id number] Date of Birth: [birth year]  Today's Date: 02/24/2020 OT Individual Time: 1335-1443 OT Individual Time Calculation (min): 68 min   Session Note:  Pt completed shower and dressing during session.  Min guard assist for functional mobility to the tub bench with use of the RW for support.  He was able to complete doffing all clothing with min guard assist as well prior to shower.  Therapist then covered the right foot for shower.  Next, he completed all bathing sit to stand with min guard assist and use of the grab bars for support.  Once this was complete, he transferred out to the wheelchair for dressing tasks.  He donned his pullover shirt with independence and then used the reacher for donning his LB clothing over his feet.  He stood with min guard assist to pull items over his hips.  The reacher was also used for fastening his veclro post op shoe.  He finished session with pt in the wheelchair with call button in reach and safety belt in place.    Patient has met 11 of 11 long term goals due to improved activity tolerance, improved balance and ability to compensate for deficits.  Patient to discharge at [facility] Assist level.  Patient's care partner is independent to provide the necessary physical assistance at discharge.    Reasons goals not met: NA  Recommendation:  Patient will benefit from ongoing skilled OT services in home health setting to continue to advance functional skills in the area of BADL and Reduce care partner burden.  Feel pt will benefit from continued HHOT to progress to modified independent level with basic selfcare tasks and toileting.  Recommend initial 24 hr supervision/min guard assist for all transfers at home.    Equipment: No equipment provided  Reasons for discharge: treatment goals met and discharge from hospital  Patient/family agrees with progress made and goals  achieved: Yes  OT Discharge Precautions/Restrictions  Precautions Precautions: Fall;Other (comment) Precaution Comments: Partial WB 25% RLE Restrictions Weight Bearing Restrictions: Yes RLE Weight Bearing: Partial weight bearing RLE Partial Weight Bearing Percentage or Pounds: 25%  Pain Pain Assessment Pain Score: 0-No pain ADL ADL Eating: Independent Where Assessed-Eating: Wheelchair Grooming: Modified independent Where Assessed-Grooming: Wheelchair Upper Body Bathing: Modified independent Where Assessed-Upper Body Bathing: Shower Lower Body Bathing: Contact guard Where Assessed-Lower Body Bathing: Administrator, sports Dressing: Independent Where Assessed-Upper Body Dressing: Wheelchair Lower Body Dressing: Modified independent Where Assessed-Lower Body Dressing: Wheelchair Toileting: Contact guard Where Assessed-Toileting: Bedside Commode Toilet Transfer: Therapist, music Method: Arts development officer: Geophysical data processor: Curator Method: Heritage manager: Civil engineer, contracting with back Vision Baseline Vision/History: Wears glasses Wears Glasses: At all times Patient Visual Report: No change from baseline Vision Assessment?: No apparent visual deficits Perception  Perception: Within Functional Limits Praxis Praxis: Intact Cognition Overall Cognitive Status: Within Functional Limits for tasks assessed Arousal/Alertness: Awake/alert Orientation Level: Oriented X4 Attention: Sustained Sustained Attention: Appears intact Memory: Appears intact Awareness: Appears intact Problem Solving: Appears intact Safety/Judgment: Impaired Comments: Pt still needs occasional cueing for sequencing of walker and steps but overall improved from admission. Sensation Sensation Light Touch: Appears Intact Hot/Cold: Appears Intact Proprioception: Appears Intact Stereognosis: Appears  Intact Additional Comments: Sensation intact in BUEs Coordination Gross Motor Movements are Fluid and Coordinated: Yes Fine Motor Movements are Fluid and Coordinated: Yes Motor  Motor Motor -  Discharge Observations: Generalized weakness Mobility  Transfers Sit to Stand: Supervision/Verbal cueing Stand to Sit: Contact Guard/Touching assist  Trunk/Postural Assessment  Cervical Assessment Cervical Assessment: Exceptions to WFL(forward cervical protraction at rest) Thoracic Assessment Thoracic Assessment: Exceptions to WFL(slight thoracic flexion) Lumbar Assessment Lumbar Assessment: Exceptions to WFL(slight lumbar flexion in standing)  Balance Static Sitting Balance Static Sitting - Balance Support: Bilateral upper extremity supported Static Sitting - Level of Assistance: 7: Independent Dynamic Sitting Balance Dynamic Sitting - Balance Support: During functional activity Dynamic Sitting - Level of Assistance: 6: Modified independent (Device/Increase time) Static Standing Balance Static Standing - Balance Support: During functional activity Static Standing - Level of Assistance: 5: Stand by assistance Dynamic Standing Balance Dynamic Standing - Balance Support: During functional activity Dynamic Standing - Level of Assistance: Other (comment)(contact guard assist) Extremity/Trunk Assessment RUE Assessment RUE Assessment: Within Functional Limits LUE Assessment LUE Assessment: Within Functional Limits   Maeva Dant OTR/L 02/24/2020, 4:39 PM

## 2020-02-25 ENCOUNTER — Encounter: Payer: Self-pay | Admitting: Neurology

## 2020-02-25 ENCOUNTER — Telehealth: Payer: Self-pay

## 2020-02-25 NOTE — Telephone Encounter (Signed)
Pt wife returned call. The patient was recently in the hospital and has just been discharged. I was able to review with the wife the sleep study results and advise of Dr Dohmeier's recommendation. Advised that the daytime study, while it showed he took naps, the pt never went into the REM stage in those naps. Advised this is how narcolepsy would be diagnosed. 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 Advised the patient's wife that in most cases when narcolepsy is not a factor then we are able to make sure that he continues to successfully treat apnea with his machine and discuss medication to help with that daytime sleepiness. Advised I would let them discuss this further in his follow up (telephone) visit for tomorrow. Patient's wife was appreciative for the information and looks forward to that visit.

## 2020-02-25 NOTE — Progress Notes (Signed)
[place] PHYSICAL MEDICINE & REHABILITATION PROGRESS NOTE   Subjective/Complaints:  No issues overnite, received D/C instructions no other question s    ROS: Patient denies CP, SOB, N/V/D Objective:   No results found. No results for input(s): WBC, HGB, HCT, PLT in the last 72 hours. Recent Labs    02/22/20 2327  CREATININE 1.66*    Intake/Output Summary (Last 24 hours) at 02/25/2020 0857 Last data filed at 02/25/2020 0805 Gross per 24 hour  Intake 234 ml  Output 775 ml  Net -541 ml     Physical Exam: Vital Signs Blood pressure 114/75, pulse 99, temperature 98 F (36.7 C), resp. rate 16, height 5\' 7"  (1.702 m), weight 74.5 kg, SpO2 100 %.    General: No acute distress Mood and affect are appropriate Heart: Regular rate and rhythm no rubs murmurs or extra sounds Lungs: Clear to auscultation, breathing unlabored, no rales or wheezes Abdomen: Positive bowel sounds, soft nontender to palpation, nondistended Extremities: No clubbing, cyanosis, or edema Keflix on RIgh tfoot    Assessment/Plan: 1. Functional deficits secondary to Right IT hip fx  , Right 2nd toe amp  Stable for D/C today F/u PCP in 3-4 weeks F/u ortho 1 weeks See D/C summary See D/C instructions Care Tool:  Bathing    Body parts bathed by patient: Right arm, Left arm, Chest, Abdomen, Right upper leg, Left upper leg, Face, Front perineal area, Buttocks, Left lower leg   Body parts bathed by helper: Left lower leg Body parts n/a: Right lower leg   Bathing assist Assist Level: Contact Guard/Touching assist     Upper Body Dressing/Undressing Upper body dressing   What is the patient wearing?: Pull over shirt    Upper body assist Assist Level: Independent    Lower Body Dressing/Undressing Lower body dressing      What is the patient wearing?: Pants, Incontinence brief     Lower body assist Assist for lower body dressing: Contact Guard/Touching assist     Toileting Toileting     Toileting assist Assist for toileting: Contact Guard/Touching assist     Transfers Chair/bed transfer  Transfers assist     Chair/bed transfer assist level: Contact Guard/Touching assist Chair/bed transfer assistive device: Programmer, multimedia   Ambulation assist      Assist level: Contact Guard/Touching assist Assistive device: Walker-rolling Max distance: 10'   Walk 10 feet activity   Assist     Assist level: Supervision/Verbal cueing Assistive device: Walker-rolling   Walk 50 feet activity   Assist Walk 50 feet with 2 turns activity did not occur: Safety/medical concerns(2/2 pain in RLE and pt fatigue)  Assist level: Supervision/Verbal cueing Assistive device: Walker-rolling    Walk 150 feet activity   Assist Walk 150 feet activity did not occur: Safety/medical concerns  Assist level: Supervision/Verbal cueing Assistive device: Walker-rolling    Walk 10 feet on uneven surface  activity   Assist Walk 10 feet on uneven surfaces activity did not occur: Safety/medical concerns   Assist level: Minimal Assistance - Patient > 75% Assistive device: Aeronautical engineer Will patient use wheelchair at discharge?: Yes Type of Wheelchair: Manual    Wheelchair assist level: Independent Max wheelchair distance: 150'    Wheelchair 50 feet with 2 turns activity    Assist        Assist Level: Independent   Wheelchair 150 feet activity     Assist      Assist Level:  Independent   Blood pressure 114/75, pulse 99, temperature 98 F (36.7 C), resp. rate 16, height 5\' 7"  (1.702 m), weight 74.5 kg, SpO2 100 %.  Medical Problem List and Plan:  1.  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  Partial weightbearing 25% right lower extremity Rehab evals to day ,               -patient may shower with wrapping over right foot and ankle       D/C home today  -Continue PT and OT  2.  Antithrombotics:  -DVT/anticoagulation: Lovenox.  Normal  vascular study              -antiplatelet therapy: N/A  3. Pain Management: Hydrocodone and Robaxin as needed. Well controlled.   4. Mood: Zoloft 200 mg daily              -antipsychotic agents: N/A  5. Neuropsych: This patient is capable of making decisions on his own behalf.  6. 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 Fluids/Electrolytes/Nutrition: Routine in and outs with follow-up chemistries, elevated BUN, encourage fluids . Adequate intake   8.  Acute on chronic anemia.  Followed by Dr. [name]. continue iron supplement.  Follow-up CBC  9.  Undisplaced fracture lamina bilaterally of C6.  healed as of CT 2/17   10.  Chronic diastolic congestive heart failure.  Followed by Dr. [name] monitor for any signs of fluid overload.  Patient currently euvolemic no edema home Demadex Lasix on hold resume in 1 to 2 days.  Daily weights ordered; not being performed; reordered 3/1.   3/4-3/5: weight decreasing.   3/6: slightly increased Filed Weights   02/23/20 0425 02/24/20 0504 02/25/20 0438  Weight: 76.9 kg 76.6 kg 74.5 kg    Stable 3/10 11.  History of atrial fibrillation.  Coumadin in the past discontinued 2014 due to spontaneous subdural hematoma with craniotomy.  Toprol 12.5 mg 12.5 mg  Daily. Cardiac rate controlled Vitals:   02/24/20 1921 02/25/20 0438  BP: 104/60 114/75  Pulse: (!) 57 99  Resp: 18 16  Temp: 98.1 F (36.7 C) 98 F (36.7 C)  SpO2: 97% 100%  controlled 3/10   12.  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  Following chemistries  13.  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  No chest pain or shortness of breath  14.  Diet-controlled diabetes mellitus..   CBGs discontinued  15.  BPH.  Flomax 0.4 mg nightly,Myrbetriq 50 mg daily.  Check PVR  16.  Hyperlipidemia.  Crestor 40 mg daily  17.  OSA/pulmonary fibrosis.  CPAP.  Patient has been refusing at times.  18. Constipation: Will increase Colace to TID. 3/2: Will add Senna HS. Well controlled with this regimen.     LOS: 16 days A FACE TO [place] E [name] 02/25/2020, 8:57 AM

## 2020-02-25 NOTE — Telephone Encounter (Signed)
Called pt, he has not been using his cpap because of recent hosp visit. R/s his appt further out so that he may use the machine and we can get more data.

## 2020-02-25 NOTE — Discharge Instructions (Signed)
Inpatient Rehab Discharge Instructions  [name]. Discharge date and time: No discharge date for patient encounter.   Activities/Precautions/ Functional Status: Activity: Partial weightbearing right lower extremity Diet: diabetic diet Wound Care: keep wound clean and dry Functional status:  ___ No restrictions     ___ Walk up steps independently ___ 24/7 supervision/assistance   ___ Walk up steps with assistance ___ Intermittent supervision/assistance  ___ Bathe/dress independently ___ Walk with walker     _x__ Bathe/dress with assistance ___ Walk Independently    ___ Shower independently ___ Walk with assistance    ___ Shower with assistance ___ No alcohol     ___ Return to work/school ________  COMMUNITY REFERRALS UPON DISCHARGE:  Home Health: PT, OT  Agency: [facility] [phone]  Medical Equipment/Items Ordered RW from [facility], W/C from [facility]  Agency/Supplier:[facility]  Special Instructions: No driving smoking or alcohol   My questions have been answered and I understand these instructions. I will adhere to these goals and the provided educational materials after my discharge from the hospital.  Patient/Caregiver Signature _______________________________ Date __________  Clinician Signature _______________________________________ Date __________  Please bring this form and your medication list with you to all your follow-up doctor's appointments.

## 2020-02-25 NOTE — Progress Notes (Signed)
Patient discharged home to family, all belongings sent with patient. No s/s of distress noted. Audie Clear, LPN

## 2020-02-26 ENCOUNTER — Ambulatory Visit: Payer: Medicare Other | Admitting: Adult Health

## 2020-02-27 ENCOUNTER — Telehealth: Payer: Self-pay | Admitting: Orthopedic Surgery

## 2020-02-27 DIAGNOSIS — E1122 Type 2 diabetes mellitus with diabetic chronic kidney disease: Secondary | ICD-10-CM | POA: Diagnosis not present

## 2020-02-27 DIAGNOSIS — Z8673 Personal history of transient ischemic attack (TIA), and cerebral infarction without residual deficits: Secondary | ICD-10-CM | POA: Diagnosis not present

## 2020-02-27 DIAGNOSIS — I361 Nonrheumatic tricuspid (valve) insufficiency: Secondary | ICD-10-CM | POA: Diagnosis not present

## 2020-02-27 DIAGNOSIS — I5032 Chronic diastolic (congestive) heart failure: Secondary | ICD-10-CM | POA: Diagnosis not present

## 2020-02-27 DIAGNOSIS — D631 Anemia in chronic kidney disease: Secondary | ICD-10-CM | POA: Diagnosis not present

## 2020-02-27 DIAGNOSIS — I130 Hypertensive heart and chronic kidney disease with heart failure and stage 1 through stage 4 chronic kidney disease, or unspecified chronic kidney disease: Secondary | ICD-10-CM | POA: Diagnosis not present

## 2020-02-27 DIAGNOSIS — Z4781 Encounter for orthopedic aftercare following surgical amputation: Secondary | ICD-10-CM | POA: Diagnosis not present

## 2020-02-27 DIAGNOSIS — E1140 Type 2 diabetes mellitus with diabetic neuropathy, unspecified: Secondary | ICD-10-CM | POA: Diagnosis not present

## 2020-02-27 DIAGNOSIS — D472 Monoclonal gammopathy: Secondary | ICD-10-CM | POA: Diagnosis not present

## 2020-02-27 DIAGNOSIS — G4733 Obstructive sleep apnea (adult) (pediatric): Secondary | ICD-10-CM | POA: Diagnosis not present

## 2020-02-27 DIAGNOSIS — M109 Gout, unspecified: Secondary | ICD-10-CM | POA: Diagnosis not present

## 2020-02-27 DIAGNOSIS — I2510 Atherosclerotic heart disease of native coronary artery without angina pectoris: Secondary | ICD-10-CM | POA: Diagnosis not present

## 2020-02-27 DIAGNOSIS — I480 Paroxysmal atrial fibrillation: Secondary | ICD-10-CM | POA: Diagnosis not present

## 2020-02-27 DIAGNOSIS — I50812 Chronic right heart failure: Secondary | ICD-10-CM | POA: Diagnosis not present

## 2020-02-27 DIAGNOSIS — S72141D Displaced intertrochanteric fracture of right femur, subsequent encounter for closed fracture with routine healing: Secondary | ICD-10-CM | POA: Diagnosis not present

## 2020-02-27 DIAGNOSIS — Z951 Presence of aortocoronary bypass graft: Secondary | ICD-10-CM | POA: Diagnosis not present

## 2020-02-27 DIAGNOSIS — N400 Enlarged prostate without lower urinary tract symptoms: Secondary | ICD-10-CM | POA: Diagnosis not present

## 2020-02-27 DIAGNOSIS — Z950 Presence of cardiac pacemaker: Secondary | ICD-10-CM | POA: Diagnosis not present

## 2020-02-27 DIAGNOSIS — J8410 Pulmonary fibrosis, unspecified: Secondary | ICD-10-CM | POA: Diagnosis not present

## 2020-02-27 DIAGNOSIS — E785 Hyperlipidemia, unspecified: Secondary | ICD-10-CM | POA: Diagnosis not present

## 2020-02-27 DIAGNOSIS — N1831 Chronic kidney disease, stage 3a: Secondary | ICD-10-CM | POA: Diagnosis not present

## 2020-02-27 DIAGNOSIS — R413 Other amnesia: Secondary | ICD-10-CM | POA: Diagnosis not present

## 2020-02-27 DIAGNOSIS — Z89421 Acquired absence of other right toe(s): Secondary | ICD-10-CM | POA: Diagnosis not present

## 2020-02-27 NOTE — Telephone Encounter (Signed)
I called and sw HHPT to advise verbal orders as below and called to advise pt and wife that he may resume all medication as he was taking prior to surgery had not been advised to d/c any medication per pt.

## 2020-02-27 NOTE — Telephone Encounter (Signed)
Leslie/[facility]/PT called and needed verbal orders PT 1 week 1 2 week 3 1week1 1every other week 4  The patients wife wants to know if patient can resume medications Gabapentin Potasium Colcholince(?)  Please call Magda Paganini about orders [phone]

## 2020-03-01 DIAGNOSIS — Z4781 Encounter for orthopedic aftercare following surgical amputation: Secondary | ICD-10-CM | POA: Diagnosis not present

## 2020-03-01 DIAGNOSIS — I5032 Chronic diastolic (congestive) heart failure: Secondary | ICD-10-CM | POA: Diagnosis not present

## 2020-03-01 DIAGNOSIS — E1122 Type 2 diabetes mellitus with diabetic chronic kidney disease: Secondary | ICD-10-CM | POA: Diagnosis not present

## 2020-03-01 DIAGNOSIS — I50812 Chronic right heart failure: Secondary | ICD-10-CM | POA: Diagnosis not present

## 2020-03-01 DIAGNOSIS — S72141D Displaced intertrochanteric fracture of right femur, subsequent encounter for closed fracture with routine healing: Secondary | ICD-10-CM | POA: Diagnosis not present

## 2020-03-01 DIAGNOSIS — I130 Hypertensive heart and chronic kidney disease with heart failure and stage 1 through stage 4 chronic kidney disease, or unspecified chronic kidney disease: Secondary | ICD-10-CM | POA: Diagnosis not present

## 2020-03-02 DIAGNOSIS — Z4781 Encounter for orthopedic aftercare following surgical amputation: Secondary | ICD-10-CM | POA: Diagnosis not present

## 2020-03-02 DIAGNOSIS — E1122 Type 2 diabetes mellitus with diabetic chronic kidney disease: Secondary | ICD-10-CM | POA: Diagnosis not present

## 2020-03-02 DIAGNOSIS — I5032 Chronic diastolic (congestive) heart failure: Secondary | ICD-10-CM | POA: Diagnosis not present

## 2020-03-02 DIAGNOSIS — I130 Hypertensive heart and chronic kidney disease with heart failure and stage 1 through stage 4 chronic kidney disease, or unspecified chronic kidney disease: Secondary | ICD-10-CM | POA: Diagnosis not present

## 2020-03-02 DIAGNOSIS — I50812 Chronic right heart failure: Secondary | ICD-10-CM | POA: Diagnosis not present

## 2020-03-02 DIAGNOSIS — S72141D Displaced intertrochanteric fracture of right femur, subsequent encounter for closed fracture with routine healing: Secondary | ICD-10-CM | POA: Diagnosis not present

## 2020-03-04 ENCOUNTER — Encounter: Payer: Self-pay | Admitting: Orthopedic Surgery

## 2020-03-04 ENCOUNTER — Other Ambulatory Visit: Payer: Self-pay

## 2020-03-04 ENCOUNTER — Ambulatory Visit (INDEPENDENT_AMBULATORY_CARE_PROVIDER_SITE_OTHER): Payer: Medicare Other | Admitting: Orthopedic Surgery

## 2020-03-04 VITALS — Ht 67.0 in | Wt 164.0 lb

## 2020-03-04 DIAGNOSIS — Z89421 Acquired absence of other right toe(s): Secondary | ICD-10-CM

## 2020-03-04 DIAGNOSIS — I872 Venous insufficiency (chronic) (peripheral): Secondary | ICD-10-CM

## 2020-03-04 DIAGNOSIS — S98131A Complete traumatic amputation of one right lesser toe, initial encounter: Secondary | ICD-10-CM

## 2020-03-04 NOTE — Progress Notes (Signed)
Office Visit Note   Patient: [name].           Date of Birth: 06/27/1941           MRN: [id number] Visit Date: 03/04/2020              Requested by: [name], MD [address] [place],  [place] [address] PCP: [name], MD  Chief Complaint  Patient presents with  . Right Foot - Routine Post Op    02/06/20 right foot 2nd toe amputation       HPI: Patient is a 79 year old gentleman who presents 4 weeks status post right foot second toe amputation.  Patient states he is just recently developed a small scabbed area over the dorsum of the IP joint of the right great toe  Assessment & Plan: Visit Diagnoses:  1. Venous stasis dermatitis of both lower extremities   2. Amputated toe of right foot ([place])     Plan: Sutures harvested today recommended advance to regular shoewear and ensure that he does not have any pressure over the great toe.  Recommended he continue with his compression socks for his venous insufficiency.  Follow-Up Instructions: Return in about 3 weeks (around 03/25/2020).   Ortho Exam  Patient is alert, oriented, no adenopathy, well-dressed, normal affect, normal respiratory effort. Examination the second toe amputation right foot is well-healed there is no redness no cellulitis no drainage sutures are harvested.  Patient does have callus beneath the first metatarsal head great toe after informed consent a 10 blade knife was used to debride the skin and soft tissue back to healthy viable tissue no signs of infection.  Imaging: No results found. No images are attached to the encounter.  Labs: Lab Results  Component Value Date   HGBA1C 5.6 05/21/2019   HGBA1C 5.7 (H) 07/30/2012   HGBA1C  01/07/2008    5.3 (NOTE)   The ADA recommends the following therapeutic goals for glycemic   control related to Hgb A1C measurement:   Goal of Therapy:   < 7.0% Hgb A1C   Action Suggested:  > 8.0% Hgb A1C   Ref:  Diabetes Care, 22, Suppl. 1, 1999     ESRSEDRATE 40 (H) 05/01/2019   ESRSEDRATE 45 (H) 12/16/2018   ESRSEDRATE 18 06/18/2018   CRP <0.5 06/16/2013   LABURIC 3.9 12/16/2018   REPTSTATUS 05/20/2019 FINAL 05/15/2019   CULT  05/15/2019    NO GROWTH 5 DAYS Performed at [facility] [address] [place],  [address]      Lab Results  Component Value Date   ALBUMIN 3.1 (L) 02/10/2020   ALBUMIN 3.8 09/25/2019   ALBUMIN 3.5 05/27/2019   LABURIC 3.9 12/16/2018    Lab Results  Component Value Date   MG 2.2 02/08/2020   MG 2.0 02/06/2020   MG 2.3 09/09/2019   No results found for: VD25OH  No results found for: PREALBUMIN CBC EXTENDED Latest Ref Rng & Units 02/10/2020 02/09/2020 02/08/2020  WBC 4.0 - 10.5 K/uL 3.6(L) 3.3(L) 4.4  RBC 4.22 - 5.81 MIL/uL 3.04(L) 3.01(L) 2.99(L)  HGB 13.0 - 17.0 g/dL 9.4(L) 9.4(L) 9.2(L)  HCT 39.0 - 52.0 % 28.9(L) 29.0(L) 28.0(L)  PLT 150 - 400 K/uL 87(L) 70(L) 82(L)  NEUTROABS 1.7 - 7.7 K/uL 2.5 - -  LYMPHSABS 0.7 - 4.0 K/uL 0.8 - -     Body mass index is 25.69 kg/m.  Orders:  No orders of the defined types were  placed in this encounter.  No orders of the defined types were placed in this encounter.    Procedures: No procedures performed  Clinical Data: No additional findings.  ROS:  All other systems negative, except as noted in the HPI. Review of Systems  Objective: Vital Signs: Ht 5\' 7"  (1.702 m)   Wt 164 lb (74.4 kg)   BMI 25.69 kg/m   Specialty Comments:  No specialty comments available.  PMFS History: Patient Active Problem List   Diagnosis Date Noted  . OSA on CPAP 02/11/2020  . Persistent hypersomnia 02/11/2020  . Cognitive decline 02/11/2020  . Hip fracture ([place]) 02/09/2020  . Osteomyelitis of second toe of right foot ([place])   . Closed right hip fracture, initial encounter ([place]) 02/04/2020  . Closed intertrochanteric fracture of right hip, initial encounter ([place]) 02/04/2020  . Closed nondisplaced intertrochanteric fracture of right  femur ([place])   . Symptomatic bradycardia 09/24/2019  . Chronic diastolic heart failure ([place]) 06/03/2019  . Coronary artery disease involving native heart without angina pectoris 06/03/2019  . Debility 05/26/2019  . 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 Toe infection 05/17/2019  . DOE (dyspnea on exertion) 05/15/2019  . Severe tricuspid regurgitation 05/15/2019  . Chronic right-sided CHF (congestive heart failure) ([place]) 05/15/2019  . Cellulitis of fourth toe of left foot   . Excessive daytime sleepiness 02/11/2019  . 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[place]) 02/11/2019  . Iron deficiency anemia 02/04/2019  . Poor memory 12/16/2018  . Deficiency anemia 11/19/2018  . Other fatigue 11/19/2018  . History of elevated PSA 11/19/2018  . Prostate cancer screening 11/19/2018  . Prostate CA ([place]) 11/19/2018  . Pancytopenia, acquired ([place]) 08/13/2018  . Recurrent falls 08/13/2018  . 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 Peripheral neuropathy 07/31/2018  . 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 Anemia, chronic disease 01/28/2018  . History of skin cancer 07/27/2017  . Fatty liver disease, nonalcoholic Q000111Q  . Thrombocytopenia ([place]) 11/27/2016  . Lung nodule 04/23/2014  . Postinflammatory pulmonary fibrosis ([place]) 06/13/2013  . Long term (current) use of anticoagulants 11/22/2011  . Abdominal bruit 03/07/2011  . Pacemaker-St.Jude   . Hx of CABG   . Hyperlipidemia type II   . OSA (obstructive sleep apnea)   . PACEMAKER, PERMANENT 08/17/2010  . HYPERLIPIDEMIA 08/16/2010  . Obesity 08/16/2010  . Essential hypertension 08/16/2010  . Atrial fibrillation ([place]) 08/16/2010  . IRREGULAR HEART RATE 08/16/2010  . Sleep apnea 08/16/2010  . BENIGN PROSTATIC HYPERTROPHY, HX OF 08/16/2010   Past Medical History:  Diagnosis Date  . (HFpEF) heart failure with preserved ejection fraction ([place])    a. 05/2013 Echo: EF 55%,  mild LVH, diast dysfxn, Ao sclerosis, mildly dil LA, RV dysfxn (poorly visualized), PASP 61mmHg;  b. 03/2017 Echo: EF 55-60%, no rwma, triv MR, mildly dil RV, mod TR, PASP 24mmHg.  . Atrial fibrillation [facility])    s/p Cox Maze 1/09;  Multaq Rx d/c'd in 2014 due to pulmo fibrosis;  coumadin d/c'd in 2014 due to spontaneous subdural hematoma  . BPH (benign prostatic hyperplasia)   . CAD (coronary artery disease), native coronary artery    a. s/p CABG 12/2007;  b. Myoview 12/2011: EF 66%, no scar or ischemia; normal.  . DM (diabetes mellitus) ([place])   . Hyperlipidemia type II   . Hypertension   . 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 OSA (obstructive sleep apnea)   . Pacemaker  PPM - St. Jude  . Peripheral neuropathy 07/31/2018  . Pulmonary fibrosis ([place])    Multaq d/c'd 7/14  . Subdural hematoma ([place]) 07/2012   spontaneous;  coumadin d/c'd => no longer a candidate for anticoagulation    Family History  Problem Relation Age of Onset  . Heart disease Father   . Heart attack Father   . Heart failure Father   . Heart disease Mother   . Alzheimer's disease Mother   . Dementia Mother     Past Surgical History:  Procedure Laterality Date  . AMPUTATION Left 05/17/2019   Procedure: AMPUTATION LEFT FOURTH TOE;  Surgeon: Meredith Pel, MD;  Location: [facility];  Service: Orthopedics;  Laterality: Left;  . AMPUTATION TOE Right 02/06/2020   Procedure: AMPUTATION TOE;  Surgeon: Newt Minion, MD;  Location: [facility];  Service: Orthopedics;  Laterality: Right;  . APPENDECTOMY    . CHOLECYSTECTOMY    . CORONARY ARTERY BYPASS GRAFT     x3 (left internal mammary artery to distal left anterior descending coronary artery, saphenous vain graft to second circumflex marginal branch, saphenous vain graft to posterior descending coronary artery, endoscopic saphenous vain harvest from right thigh) and modified Cox - Maze IV procedure.  [name],MD. Electronically signed  CHO/MEDQ D: 01/09/2008/ JOB: [id number] cc:  [name]  . [name]  07/30/2012   Procedure: CRANIOTOMY HEMATOMA EVACUATION SUBDURAL;  Surgeon: [name], MD;  Location: [facility];  Service: Neurosurgery;  Laterality: Right;  Right craniotomy for evacuation of subdural hematoma  . FOOT SURGERY    . HERNIA REPAIR    . INTRAMEDULLARY (IM) NAIL INTERTROCHANTERIC Right 02/04/2020   Procedure: INTRAMEDULLARY (IM) NAIL INTERTROCHANTRIC;  Surgeon: Netta Cedars, MD;  Location: [facility];  Service: Orthopedics;  Laterality: Right;  . ORCHIECTOMY     Left  /  testicular cancer  . PACEMAKER PLACEMENT     PPM - St. Jude  . PPM GENERATOR CHANGEOUT N/A 06/25/2019   Procedure: PPM GENERATOR CHANGEOUT;  Surgeon: Evans Lance, MD;  Location: [facility] CV LAB;  Service: Cardiovascular;  Laterality: N/A;   Social History   Occupational History  . Occupation: Retired- IT trainer  Tobacco Use  . Smoking status: Former Smoker    Packs/day: 2.00    Years: 20.00    Pack years: 40.00    Types: Cigarettes    Quit date: 02/21/1991    Years since quitting: 29.0  . Smokeless tobacco: Never Used  Substance and Sexual Activity  . Alcohol use: No    Alcohol/week: 0.0 standard drinks  . Drug use: No  . Sexual activity: Not Currently

## 2020-03-05 DIAGNOSIS — S72141D Displaced intertrochanteric fracture of right femur, subsequent encounter for closed fracture with routine healing: Secondary | ICD-10-CM | POA: Diagnosis not present

## 2020-03-05 DIAGNOSIS — E1122 Type 2 diabetes mellitus with diabetic chronic kidney disease: Secondary | ICD-10-CM | POA: Diagnosis not present

## 2020-03-05 DIAGNOSIS — I130 Hypertensive heart and chronic kidney disease with heart failure and stage 1 through stage 4 chronic kidney disease, or unspecified chronic kidney disease: Secondary | ICD-10-CM | POA: Diagnosis not present

## 2020-03-05 DIAGNOSIS — Z4781 Encounter for orthopedic aftercare following surgical amputation: Secondary | ICD-10-CM | POA: Diagnosis not present

## 2020-03-05 DIAGNOSIS — I5032 Chronic diastolic (congestive) heart failure: Secondary | ICD-10-CM | POA: Diagnosis not present

## 2020-03-05 DIAGNOSIS — I50812 Chronic right heart failure: Secondary | ICD-10-CM | POA: Diagnosis not present

## 2020-03-08 DIAGNOSIS — Z4781 Encounter for orthopedic aftercare following surgical amputation: Secondary | ICD-10-CM | POA: Diagnosis not present

## 2020-03-08 DIAGNOSIS — I50812 Chronic right heart failure: Secondary | ICD-10-CM | POA: Diagnosis not present

## 2020-03-08 DIAGNOSIS — S72141D Displaced intertrochanteric fracture of right femur, subsequent encounter for closed fracture with routine healing: Secondary | ICD-10-CM | POA: Diagnosis not present

## 2020-03-08 DIAGNOSIS — E1122 Type 2 diabetes mellitus with diabetic chronic kidney disease: Secondary | ICD-10-CM | POA: Diagnosis not present

## 2020-03-08 DIAGNOSIS — I5032 Chronic diastolic (congestive) heart failure: Secondary | ICD-10-CM | POA: Diagnosis not present

## 2020-03-08 DIAGNOSIS — I130 Hypertensive heart and chronic kidney disease with heart failure and stage 1 through stage 4 chronic kidney disease, or unspecified chronic kidney disease: Secondary | ICD-10-CM | POA: Diagnosis not present

## 2020-03-09 DIAGNOSIS — Z4781 Encounter for orthopedic aftercare following surgical amputation: Secondary | ICD-10-CM | POA: Diagnosis not present

## 2020-03-09 DIAGNOSIS — I5032 Chronic diastolic (congestive) heart failure: Secondary | ICD-10-CM | POA: Diagnosis not present

## 2020-03-09 DIAGNOSIS — I50812 Chronic right heart failure: Secondary | ICD-10-CM | POA: Diagnosis not present

## 2020-03-09 DIAGNOSIS — S72141D Displaced intertrochanteric fracture of right femur, subsequent encounter for closed fracture with routine healing: Secondary | ICD-10-CM | POA: Diagnosis not present

## 2020-03-09 DIAGNOSIS — I130 Hypertensive heart and chronic kidney disease with heart failure and stage 1 through stage 4 chronic kidney disease, or unspecified chronic kidney disease: Secondary | ICD-10-CM | POA: Diagnosis not present

## 2020-03-09 DIAGNOSIS — E1122 Type 2 diabetes mellitus with diabetic chronic kidney disease: Secondary | ICD-10-CM | POA: Diagnosis not present

## 2020-03-10 DIAGNOSIS — I50812 Chronic right heart failure: Secondary | ICD-10-CM | POA: Diagnosis not present

## 2020-03-10 DIAGNOSIS — E1122 Type 2 diabetes mellitus with diabetic chronic kidney disease: Secondary | ICD-10-CM | POA: Diagnosis not present

## 2020-03-10 DIAGNOSIS — I5032 Chronic diastolic (congestive) heart failure: Secondary | ICD-10-CM | POA: Diagnosis not present

## 2020-03-10 DIAGNOSIS — Z4781 Encounter for orthopedic aftercare following surgical amputation: Secondary | ICD-10-CM | POA: Diagnosis not present

## 2020-03-10 DIAGNOSIS — L570 Actinic keratosis: Secondary | ICD-10-CM | POA: Diagnosis not present

## 2020-03-10 DIAGNOSIS — C44319 Basal cell carcinoma of skin of other parts of face: Secondary | ICD-10-CM | POA: Diagnosis not present

## 2020-03-10 DIAGNOSIS — S72141D Displaced intertrochanteric fracture of right femur, subsequent encounter for closed fracture with routine healing: Secondary | ICD-10-CM | POA: Diagnosis not present

## 2020-03-10 DIAGNOSIS — I130 Hypertensive heart and chronic kidney disease with heart failure and stage 1 through stage 4 chronic kidney disease, or unspecified chronic kidney disease: Secondary | ICD-10-CM | POA: Diagnosis not present

## 2020-03-10 DIAGNOSIS — D485 Neoplasm of uncertain behavior of skin: Secondary | ICD-10-CM | POA: Diagnosis not present

## 2020-03-10 DIAGNOSIS — C44311 Basal cell carcinoma of skin of nose: Secondary | ICD-10-CM | POA: Diagnosis not present

## 2020-03-10 DIAGNOSIS — Z85828 Personal history of other malignant neoplasm of skin: Secondary | ICD-10-CM | POA: Diagnosis not present

## 2020-03-12 DIAGNOSIS — S72141D Displaced intertrochanteric fracture of right femur, subsequent encounter for closed fracture with routine healing: Secondary | ICD-10-CM | POA: Diagnosis not present

## 2020-03-12 DIAGNOSIS — I5032 Chronic diastolic (congestive) heart failure: Secondary | ICD-10-CM | POA: Diagnosis not present

## 2020-03-12 DIAGNOSIS — I50812 Chronic right heart failure: Secondary | ICD-10-CM | POA: Diagnosis not present

## 2020-03-12 DIAGNOSIS — I130 Hypertensive heart and chronic kidney disease with heart failure and stage 1 through stage 4 chronic kidney disease, or unspecified chronic kidney disease: Secondary | ICD-10-CM | POA: Diagnosis not present

## 2020-03-12 DIAGNOSIS — E1122 Type 2 diabetes mellitus with diabetic chronic kidney disease: Secondary | ICD-10-CM | POA: Diagnosis not present

## 2020-03-12 DIAGNOSIS — Z4781 Encounter for orthopedic aftercare following surgical amputation: Secondary | ICD-10-CM | POA: Diagnosis not present

## 2020-03-15 DIAGNOSIS — E1122 Type 2 diabetes mellitus with diabetic chronic kidney disease: Secondary | ICD-10-CM | POA: Diagnosis not present

## 2020-03-15 DIAGNOSIS — S72141D Displaced intertrochanteric fracture of right femur, subsequent encounter for closed fracture with routine healing: Secondary | ICD-10-CM | POA: Diagnosis not present

## 2020-03-15 DIAGNOSIS — I50812 Chronic right heart failure: Secondary | ICD-10-CM | POA: Diagnosis not present

## 2020-03-15 DIAGNOSIS — I130 Hypertensive heart and chronic kidney disease with heart failure and stage 1 through stage 4 chronic kidney disease, or unspecified chronic kidney disease: Secondary | ICD-10-CM | POA: Diagnosis not present

## 2020-03-15 DIAGNOSIS — I5032 Chronic diastolic (congestive) heart failure: Secondary | ICD-10-CM | POA: Diagnosis not present

## 2020-03-15 DIAGNOSIS — Z4781 Encounter for orthopedic aftercare following surgical amputation: Secondary | ICD-10-CM | POA: Diagnosis not present

## 2020-03-16 DIAGNOSIS — D696 Thrombocytopenia, unspecified: Secondary | ICD-10-CM | POA: Diagnosis not present

## 2020-03-16 DIAGNOSIS — M109 Gout, unspecified: Secondary | ICD-10-CM | POA: Diagnosis not present

## 2020-03-16 DIAGNOSIS — G629 Polyneuropathy, unspecified: Secondary | ICD-10-CM | POA: Diagnosis not present

## 2020-03-16 DIAGNOSIS — N189 Chronic kidney disease, unspecified: Secondary | ICD-10-CM | POA: Diagnosis not present

## 2020-03-16 DIAGNOSIS — I2510 Atherosclerotic heart disease of native coronary artery without angina pectoris: Secondary | ICD-10-CM | POA: Diagnosis not present

## 2020-03-16 DIAGNOSIS — D508 Other iron deficiency anemias: Secondary | ICD-10-CM | POA: Diagnosis not present

## 2020-03-16 DIAGNOSIS — M62838 Other muscle spasm: Secondary | ICD-10-CM | POA: Diagnosis not present

## 2020-03-16 DIAGNOSIS — M8080XA Other osteoporosis with current pathological fracture, unspecified site, initial encounter for fracture: Secondary | ICD-10-CM | POA: Diagnosis not present

## 2020-03-16 DIAGNOSIS — Z09 Encounter for follow-up examination after completed treatment for conditions other than malignant neoplasm: Secondary | ICD-10-CM | POA: Diagnosis not present

## 2020-03-16 DIAGNOSIS — Z4789 Encounter for other orthopedic aftercare: Secondary | ICD-10-CM | POA: Diagnosis not present

## 2020-03-16 DIAGNOSIS — F339 Major depressive disorder, recurrent, unspecified: Secondary | ICD-10-CM | POA: Diagnosis not present

## 2020-03-16 DIAGNOSIS — I5032 Chronic diastolic (congestive) heart failure: Secondary | ICD-10-CM | POA: Diagnosis not present

## 2020-03-16 DIAGNOSIS — E1121 Type 2 diabetes mellitus with diabetic nephropathy: Secondary | ICD-10-CM | POA: Diagnosis not present

## 2020-03-17 DIAGNOSIS — I5032 Chronic diastolic (congestive) heart failure: Secondary | ICD-10-CM | POA: Diagnosis not present

## 2020-03-17 DIAGNOSIS — Z4781 Encounter for orthopedic aftercare following surgical amputation: Secondary | ICD-10-CM | POA: Diagnosis not present

## 2020-03-17 DIAGNOSIS — I50812 Chronic right heart failure: Secondary | ICD-10-CM | POA: Diagnosis not present

## 2020-03-17 DIAGNOSIS — E1122 Type 2 diabetes mellitus with diabetic chronic kidney disease: Secondary | ICD-10-CM | POA: Diagnosis not present

## 2020-03-17 DIAGNOSIS — S72141D Displaced intertrochanteric fracture of right femur, subsequent encounter for closed fracture with routine healing: Secondary | ICD-10-CM | POA: Diagnosis not present

## 2020-03-17 DIAGNOSIS — I130 Hypertensive heart and chronic kidney disease with heart failure and stage 1 through stage 4 chronic kidney disease, or unspecified chronic kidney disease: Secondary | ICD-10-CM | POA: Diagnosis not present

## 2020-03-18 DIAGNOSIS — Z4781 Encounter for orthopedic aftercare following surgical amputation: Secondary | ICD-10-CM | POA: Diagnosis not present

## 2020-03-18 DIAGNOSIS — I130 Hypertensive heart and chronic kidney disease with heart failure and stage 1 through stage 4 chronic kidney disease, or unspecified chronic kidney disease: Secondary | ICD-10-CM | POA: Diagnosis not present

## 2020-03-18 DIAGNOSIS — S72141D Displaced intertrochanteric fracture of right femur, subsequent encounter for closed fracture with routine healing: Secondary | ICD-10-CM | POA: Diagnosis not present

## 2020-03-18 DIAGNOSIS — E1122 Type 2 diabetes mellitus with diabetic chronic kidney disease: Secondary | ICD-10-CM | POA: Diagnosis not present

## 2020-03-18 DIAGNOSIS — I5032 Chronic diastolic (congestive) heart failure: Secondary | ICD-10-CM | POA: Diagnosis not present

## 2020-03-18 DIAGNOSIS — I50812 Chronic right heart failure: Secondary | ICD-10-CM | POA: Diagnosis not present

## 2020-03-23 ENCOUNTER — Ambulatory Visit (INDEPENDENT_AMBULATORY_CARE_PROVIDER_SITE_OTHER): Payer: Medicare Other | Admitting: *Deleted

## 2020-03-23 DIAGNOSIS — I50812 Chronic right heart failure: Secondary | ICD-10-CM | POA: Diagnosis not present

## 2020-03-23 LAB — CUP PACEART REMOTE DEVICE CHECK
Battery Remaining Longevity: 120 mo
Battery Remaining Percentage: 95.5 %
Battery Voltage: 3.01 V
Brady Statistic AP VP Percent: 7.1 %
Brady Statistic AP VS Percent: 33 %
Brady Statistic AS VP Percent: 1 %
Brady Statistic AS VS Percent: 58 %
Brady Statistic RA Percent Paced: 39 %
Brady Statistic RV Percent Paced: 7.4 %
Date Time Interrogation Session: 20210406020015
Implantable Lead Implant Date: 20090420
Implantable Lead Implant Date: 20090420
Implantable Lead Location: 753859
Implantable Lead Location: 753860
Implantable Pulse Generator Implant Date: 20200708
Lead Channel Impedance Value: 460 Ohm
Lead Channel Impedance Value: 550 Ohm
Lead Channel Pacing Threshold Amplitude: 0.625 V
Lead Channel Pacing Threshold Amplitude: 1 V
Lead Channel Pacing Threshold Pulse Width: 0.4 ms
Lead Channel Pacing Threshold Pulse Width: 0.4 ms
Lead Channel Sensing Intrinsic Amplitude: 1.5 mV
Lead Channel Sensing Intrinsic Amplitude: 7.8 mV
Lead Channel Setting Pacing Amplitude: 0.875
Lead Channel Setting Pacing Amplitude: 2 V
Lead Channel Setting Pacing Pulse Width: 0.4 ms
Lead Channel Setting Sensing Sensitivity: 2 mV
Pulse Gen Model: 2272
Pulse Gen Serial Number: 9149180

## 2020-03-24 DIAGNOSIS — E1122 Type 2 diabetes mellitus with diabetic chronic kidney disease: Secondary | ICD-10-CM | POA: Diagnosis not present

## 2020-03-24 DIAGNOSIS — I5032 Chronic diastolic (congestive) heart failure: Secondary | ICD-10-CM | POA: Diagnosis not present

## 2020-03-24 DIAGNOSIS — S72141D Displaced intertrochanteric fracture of right femur, subsequent encounter for closed fracture with routine healing: Secondary | ICD-10-CM | POA: Diagnosis not present

## 2020-03-24 DIAGNOSIS — I50812 Chronic right heart failure: Secondary | ICD-10-CM | POA: Diagnosis not present

## 2020-03-24 DIAGNOSIS — I130 Hypertensive heart and chronic kidney disease with heart failure and stage 1 through stage 4 chronic kidney disease, or unspecified chronic kidney disease: Secondary | ICD-10-CM | POA: Diagnosis not present

## 2020-03-24 DIAGNOSIS — Z4781 Encounter for orthopedic aftercare following surgical amputation: Secondary | ICD-10-CM | POA: Diagnosis not present

## 2020-03-24 NOTE — Progress Notes (Signed)
PPM Remote  

## 2020-03-25 ENCOUNTER — Encounter: Payer: Self-pay | Admitting: Orthopedic Surgery

## 2020-03-25 ENCOUNTER — Other Ambulatory Visit: Payer: Self-pay

## 2020-03-25 ENCOUNTER — Ambulatory Visit (INDEPENDENT_AMBULATORY_CARE_PROVIDER_SITE_OTHER): Payer: Medicare Other | Admitting: Orthopedic Surgery

## 2020-03-25 VITALS — Ht 67.0 in | Wt 164.0 lb

## 2020-03-25 DIAGNOSIS — M6702 Short Achilles tendon (acquired), left ankle: Secondary | ICD-10-CM

## 2020-03-25 DIAGNOSIS — I872 Venous insufficiency (chronic) (peripheral): Secondary | ICD-10-CM

## 2020-03-25 DIAGNOSIS — M6701 Short Achilles tendon (acquired), right ankle: Secondary | ICD-10-CM

## 2020-03-25 DIAGNOSIS — S98131A Complete traumatic amputation of one right lesser toe, initial encounter: Secondary | ICD-10-CM

## 2020-03-25 NOTE — Progress Notes (Signed)
Office Visit Note   Patient: [name].           Date of Birth: 12/12/1941           MRN: [id number] Visit Date: 03/25/2020              Requested by: [name], MD [address] [place],  [place] [address] PCP: [name], MD  Chief Complaint  Patient presents with  . Follow-up      HPI: Patient is a 79 year old gentleman who presents in follow-up 2 months status post right foot second toe amputation.  He does have venous insufficiency he states he has compression stockings but is not wearing them.  Patient is still in a postoperative shoe on the right.  Assessment & Plan: Visit Diagnoses:  1. Venous stasis dermatitis of both lower extremities   2. Amputated toe of right foot ([place])   3. Achilles tendon contracture, bilateral     Plan: Patient will advance to regular shoewear patient was given instructions and demonstrated Achilles stretching to do 5 times a day a minute at a time.  Recommended elevation compression and exercise.  Follow-Up Instructions: Return if symptoms worsen or fail to improve.   Ortho Exam  Patient is alert, oriented, no adenopathy, well-dressed, normal affect, normal respiratory effort. Examination patient's toe amputation is healed well there is no redness no cellulitis no drainage no signs of infection.  Patient does have chronic brawny skin color changes in both lower extremities with venous insufficiency without open ulcers there is pitting edema.  Patient has Achilles contracture with dorsiflexion 10 degrees short of neutral with his knee extended.  Patient was given instructions and demonstrated Achilles stretching.  Imaging: No results found. No images are attached to the encounter.  Labs: Lab Results  Component Value Date   HGBA1C 5.6 05/21/2019   HGBA1C 5.7 (H) 07/30/2012   HGBA1C  01/07/2008    5.3 (NOTE)   The ADA recommends the following therapeutic goals for glycemic   control related to Hgb A1C  measurement:   Goal of Therapy:   < 7.0% Hgb A1C   Action Suggested:  > 8.0% Hgb A1C   Ref:  Diabetes Care, 22, Suppl. 1, 1999   ESRSEDRATE 40 (H) 05/01/2019   ESRSEDRATE 45 (H) 12/16/2018   ESRSEDRATE 18 06/18/2018   CRP <0.5 06/16/2013   LABURIC 3.9 12/16/2018   REPTSTATUS 05/20/2019 FINAL 05/15/2019   CULT  05/15/2019    NO GROWTH 5 DAYS Performed at [facility] [address], [place], [place] [address]      Lab Results  Component Value Date   ALBUMIN 3.1 (L) 02/10/2020   ALBUMIN 3.8 09/25/2019   ALBUMIN 3.5 05/27/2019   LABURIC 3.9 12/16/2018    Lab Results  Component Value Date   MG 2.2 02/08/2020   MG 2.0 02/06/2020   MG 2.3 09/09/2019   No results found for: VD25OH  No results found for: PREALBUMIN CBC EXTENDED Latest Ref Rng & Units 02/10/2020 02/09/2020 02/08/2020  WBC 4.0 - 10.5 K/uL 3.6(L) 3.3(L) 4.4  RBC 4.22 - 5.81 MIL/uL 3.04(L) 3.01(L) 2.99(L)  HGB 13.0 - 17.0 g/dL 9.4(L) 9.4(L) 9.2(L)  HCT 39.0 - 52.0 % 28.9(L) 29.0(L) 28.0(L)  PLT 150 - 400 K/uL 87(L) 70(L) 82(L)  NEUTROABS 1.7 - 7.7 K/uL 2.5 - -  LYMPHSABS 0.7 - 4.0 K/uL 0.8 - -     Body mass index is 25.69 kg/m.  Orders:  No  orders of the defined types were placed in this encounter.  No orders of the defined types were placed in this encounter.    Procedures: No procedures performed  Clinical Data: No additional findings.  ROS:  All other systems negative, except as noted in the HPI. Review of Systems  Objective: Vital Signs: Ht 5\' 7"  (1.702 m)   Wt 164 lb (74.4 kg)   BMI 25.69 kg/m   Specialty Comments:  No specialty comments available.  PMFS History: Patient Active Problem List   Diagnosis Date Noted  . OSA on CPAP 02/11/2020  . Persistent hypersomnia 02/11/2020  . Cognitive decline 02/11/2020  . Hip fracture ([place]) 02/09/2020  . Osteomyelitis of second toe of right foot ([place])   . Closed right hip fracture, initial encounter ([place]) 02/04/2020  . Closed  intertrochanteric fracture of right hip, initial encounter ([place]) 02/04/2020  . Closed nondisplaced intertrochanteric fracture of right femur ([place])   . Symptomatic bradycardia 09/24/2019  . Chronic diastolic heart failure ([place]) 06/03/2019  . Coronary artery disease involving native heart without angina pectoris 06/03/2019  . Debility 05/26/2019  . 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 Toe infection 05/17/2019  . DOE (dyspnea on exertion) 05/15/2019  . Severe tricuspid regurgitation 05/15/2019  . Chronic right-sided CHF (congestive heart failure) ([place]) 05/15/2019  . Cellulitis of fourth toe of left foot   . Excessive daytime sleepiness 02/11/2019  . 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[place]) 02/11/2019  . Iron deficiency anemia 02/04/2019  . Poor memory 12/16/2018  . Deficiency anemia 11/19/2018  . Other fatigue 11/19/2018  . History of elevated PSA 11/19/2018  . Prostate cancer screening 11/19/2018  . Prostate CA ([place]) 11/19/2018  . Pancytopenia, acquired ([place]) 08/13/2018  . Recurrent falls 08/13/2018  . 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 Peripheral neuropathy 07/31/2018  . 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 Anemia, chronic disease 01/28/2018  . History of skin cancer 07/27/2017  . Fatty liver disease, nonalcoholic Q000111Q  . Thrombocytopenia ([place]) 11/27/2016  . Lung nodule 04/23/2014  . Postinflammatory pulmonary fibrosis ([place]) 06/13/2013  . Long term (current) use of anticoagulants 11/22/2011  . Abdominal bruit 03/07/2011  . Pacemaker-St.Jude   . Hx of CABG   . Hyperlipidemia type II   . OSA (obstructive sleep apnea)   . PACEMAKER, PERMANENT 08/17/2010  . HYPERLIPIDEMIA 08/16/2010  . Obesity 08/16/2010  . Essential hypertension 08/16/2010  . Atrial fibrillation ([place]) 08/16/2010  . IRREGULAR HEART RATE 08/16/2010  . Sleep apnea 08/16/2010  . BENIGN PROSTATIC HYPERTROPHY, HX OF 08/16/2010    Past Medical History:  Diagnosis Date  . (HFpEF) heart failure with preserved ejection fraction ([place])    a. 05/2013 Echo: EF 55%, mild LVH, diast dysfxn, Ao sclerosis, mildly dil LA, RV dysfxn (poorly visualized), PASP 20mmHg;  b. 03/2017 Echo: EF 55-60%, no rwma, triv MR, mildly dil RV, mod TR, PASP 34mmHg.  . Atrial fibrillation [facility])    s/p Cox Maze 1/09;  Multaq Rx d/c'd in 2014 due to pulmo fibrosis;  coumadin d/c'd in 2014 due to spontaneous subdural hematoma  . BPH (benign prostatic hyperplasia)   . CAD (coronary artery disease), native coronary artery    a. s/p CABG 12/2007;  b. Myoview 12/2011: EF 66%, no scar or ischemia; normal.  . DM (diabetes mellitus) ([place])   . Hyperlipidemia type II   . Hypertension   . 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 OSA (obstructive sleep  apnea)   . Pacemaker    PPM - St. Jude  . Peripheral neuropathy 07/31/2018  . Pulmonary fibrosis ([place])    Multaq d/c'd 7/14  . Subdural hematoma ([place]) 07/2012   spontaneous;  coumadin d/c'd => no longer a candidate for anticoagulation    Family History  Problem Relation Age of Onset  . Heart disease Father   . Heart attack Father   . Heart failure Father   . Heart disease Mother   . Alzheimer's disease Mother   . Dementia Mother     Past Surgical History:  Procedure Laterality Date  . AMPUTATION Left 05/17/2019   Procedure: AMPUTATION LEFT FOURTH TOE;  Surgeon: Meredith Pel, MD;  Location: [facility];  Service: Orthopedics;  Laterality: Left;  . AMPUTATION TOE Right 02/06/2020   Procedure: AMPUTATION TOE;  Surgeon: Newt Minion, MD;  Location: [facility];  Service: Orthopedics;  Laterality: Right;  . APPENDECTOMY    . CHOLECYSTECTOMY    . CORONARY ARTERY BYPASS GRAFT     x3 (left internal mammary artery to distal left anterior descending coronary artery, saphenous vain graft to second circumflex marginal branch, saphenous vain graft to posterior descending coronary artery,  endoscopic saphenous vain harvest from right thigh) and modified Cox - Maze IV procedure.  [name],MD. Electronically signed CHO/MEDQ D: 01/09/2008/ JOB: [id number] cc:  [name]  . [name]  07/30/2012   Procedure: CRANIOTOMY HEMATOMA EVACUATION SUBDURAL;  Surgeon: [name], MD;  Location: [facility];  Service: Neurosurgery;  Laterality: Right;  Right craniotomy for evacuation of subdural hematoma  . FOOT SURGERY    . HERNIA REPAIR    . INTRAMEDULLARY (IM) NAIL INTERTROCHANTERIC Right 02/04/2020   Procedure: INTRAMEDULLARY (IM) NAIL INTERTROCHANTRIC;  Surgeon: Netta Cedars, MD;  Location: [facility];  Service: Orthopedics;  Laterality: Right;  . ORCHIECTOMY     Left  /  testicular cancer  . PACEMAKER PLACEMENT     PPM - St. Jude  . PPM GENERATOR CHANGEOUT N/A 06/25/2019   Procedure: PPM GENERATOR CHANGEOUT;  Surgeon: Evans Lance, MD;  Location: [facility] LAB;  Service: Cardiovascular;  Laterality: N/A;   Social History   Occupational History  . Occupation: Retired- IT trainer  Tobacco Use  . Smoking status: Former Smoker    Packs/day: 2.00    Years: 20.00    Pack years: 40.00    Types: Cigarettes    Quit date: 02/21/1991    Years since quitting: 29.1  . Smokeless tobacco: Never Used  Substance and Sexual Activity  . Alcohol use: No    Alcohol/week: 0.0 standard drinks  . Drug use: No  . Sexual activity: Not Currently

## 2020-03-28 DIAGNOSIS — Z951 Presence of aortocoronary bypass graft: Secondary | ICD-10-CM | POA: Diagnosis not present

## 2020-03-28 DIAGNOSIS — I5032 Chronic diastolic (congestive) heart failure: Secondary | ICD-10-CM | POA: Diagnosis not present

## 2020-03-28 DIAGNOSIS — I50812 Chronic right heart failure: Secondary | ICD-10-CM | POA: Diagnosis not present

## 2020-03-28 DIAGNOSIS — I130 Hypertensive heart and chronic kidney disease with heart failure and stage 1 through stage 4 chronic kidney disease, or unspecified chronic kidney disease: Secondary | ICD-10-CM | POA: Diagnosis not present

## 2020-03-28 DIAGNOSIS — I2510 Atherosclerotic heart disease of native coronary artery without angina pectoris: Secondary | ICD-10-CM | POA: Diagnosis not present

## 2020-03-28 DIAGNOSIS — I480 Paroxysmal atrial fibrillation: Secondary | ICD-10-CM | POA: Diagnosis not present

## 2020-03-28 DIAGNOSIS — E1122 Type 2 diabetes mellitus with diabetic chronic kidney disease: Secondary | ICD-10-CM | POA: Diagnosis not present

## 2020-03-28 DIAGNOSIS — Z8673 Personal history of transient ischemic attack (TIA), and cerebral infarction without residual deficits: Secondary | ICD-10-CM | POA: Diagnosis not present

## 2020-03-28 DIAGNOSIS — D631 Anemia in chronic kidney disease: Secondary | ICD-10-CM | POA: Diagnosis not present

## 2020-03-28 DIAGNOSIS — J8410 Pulmonary fibrosis, unspecified: Secondary | ICD-10-CM | POA: Diagnosis not present

## 2020-03-28 DIAGNOSIS — D472 Monoclonal gammopathy: Secondary | ICD-10-CM | POA: Diagnosis not present

## 2020-03-28 DIAGNOSIS — E1140 Type 2 diabetes mellitus with diabetic neuropathy, unspecified: Secondary | ICD-10-CM | POA: Diagnosis not present

## 2020-03-28 DIAGNOSIS — Z4781 Encounter for orthopedic aftercare following surgical amputation: Secondary | ICD-10-CM | POA: Diagnosis not present

## 2020-03-28 DIAGNOSIS — N1831 Chronic kidney disease, stage 3a: Secondary | ICD-10-CM | POA: Diagnosis not present

## 2020-03-28 DIAGNOSIS — S72141D Displaced intertrochanteric fracture of right femur, subsequent encounter for closed fracture with routine healing: Secondary | ICD-10-CM | POA: Diagnosis not present

## 2020-03-28 DIAGNOSIS — G4733 Obstructive sleep apnea (adult) (pediatric): Secondary | ICD-10-CM | POA: Diagnosis not present

## 2020-03-28 DIAGNOSIS — N400 Enlarged prostate without lower urinary tract symptoms: Secondary | ICD-10-CM | POA: Diagnosis not present

## 2020-03-28 DIAGNOSIS — I361 Nonrheumatic tricuspid (valve) insufficiency: Secondary | ICD-10-CM | POA: Diagnosis not present

## 2020-03-28 DIAGNOSIS — Z89421 Acquired absence of other right toe(s): Secondary | ICD-10-CM | POA: Diagnosis not present

## 2020-03-28 DIAGNOSIS — R413 Other amnesia: Secondary | ICD-10-CM | POA: Diagnosis not present

## 2020-03-28 DIAGNOSIS — M109 Gout, unspecified: Secondary | ICD-10-CM | POA: Diagnosis not present

## 2020-03-28 DIAGNOSIS — Z950 Presence of cardiac pacemaker: Secondary | ICD-10-CM | POA: Diagnosis not present

## 2020-03-28 DIAGNOSIS — E785 Hyperlipidemia, unspecified: Secondary | ICD-10-CM | POA: Diagnosis not present

## 2020-03-29 DIAGNOSIS — I50812 Chronic right heart failure: Secondary | ICD-10-CM | POA: Diagnosis not present

## 2020-03-29 DIAGNOSIS — S72141D Displaced intertrochanteric fracture of right femur, subsequent encounter for closed fracture with routine healing: Secondary | ICD-10-CM | POA: Diagnosis not present

## 2020-03-29 DIAGNOSIS — E1122 Type 2 diabetes mellitus with diabetic chronic kidney disease: Secondary | ICD-10-CM | POA: Diagnosis not present

## 2020-03-29 DIAGNOSIS — I5032 Chronic diastolic (congestive) heart failure: Secondary | ICD-10-CM | POA: Diagnosis not present

## 2020-03-29 DIAGNOSIS — I130 Hypertensive heart and chronic kidney disease with heart failure and stage 1 through stage 4 chronic kidney disease, or unspecified chronic kidney disease: Secondary | ICD-10-CM | POA: Diagnosis not present

## 2020-03-29 DIAGNOSIS — Z4781 Encounter for orthopedic aftercare following surgical amputation: Secondary | ICD-10-CM | POA: Diagnosis not present

## 2020-03-30 DIAGNOSIS — C44311 Basal cell carcinoma of skin of nose: Secondary | ICD-10-CM | POA: Diagnosis not present

## 2020-03-30 DIAGNOSIS — Z85828 Personal history of other malignant neoplasm of skin: Secondary | ICD-10-CM | POA: Diagnosis not present

## 2020-03-30 DIAGNOSIS — C44319 Basal cell carcinoma of skin of other parts of face: Secondary | ICD-10-CM | POA: Diagnosis not present

## 2020-04-01 NOTE — Progress Notes (Signed)
HPI: FU CAD and atrial fibrillation. He underwent CABG (LIMA-LAD, SVG-OM 2, SVG-PDA) along with modified Cox Maze IV procedure in 12/2007. 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 Abdominal US 3/12: No aneurysm. 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 He underwent craniotomy and evacuation by Dr. Saintclair Halsted. He was taken off of Coumadin and no longer felt to be an anticoagulation candidate.Multaq DCed previously as felt causing pulmonary toxixity. Nuclear study 5/18 showed EF 59 with normal perfusion. EchocardiogramMay 2020 showed ejection fraction 50 to 55%, moderate right ventricular enlargement, mildly reduced RV function, severe tricuspid regurgitation. ABIs June 2020 normal. Carotid Dopplers June 2020 showed 1 to 39% bilateral stenosis. Transcranial Dopplers June 2020 negative.Chest CT August 2020 showed mild to moderate pulmonary fibrosis. Pulmonary function test November 2020 normal. Patient fell February 2021 and fractured his hip.  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  Since last seen,patient denies dyspnea, chest pain or syncope.  Mild pedal edema.  Current Outpatient Medications  Medication Sig Dispense Refill  . acetaminophen (TYLENOL) 325 MG tablet Take 1-2 tablets (325-650 mg total) by mouth every 4 (four) hours as needed for mild pain.    [name] allopurinol (ZYLOPRIM) 300 MG tablet Take 1 tablet (300 mg total) by mouth daily. 30 tablet 0  . Baclofen 5 MG TABS 1 TABLET AS NEEDED TWICE A DAY AS NEEDED FOR MUSCLE SPASM ORALLY 30 DAY(S)    . docusate sodium (COLACE) 100 MG capsule Take 1 capsule (100 mg total) by mouth 2 (two) times daily. 10 capsule 0  . donepezil (ARICEPT) 10 MG tablet Take 10 mg by mouth at bedtime.    [name] doxycycline (VIBRAMYCIN) 100 MG capsule Take 100 mg by mouth 2 (two) times daily.    . ferrous sulfate 325 (65 FE) MG tablet Take 1 tablet  (325 mg total) by mouth daily with breakfast. 30 tablet 3  . gabapentin (NEURONTIN) 100 MG capsule Take 100 mg by mouth 3 (three) times daily.    [name] HYDROcodone-acetaminophen (NORCO/VICODIN) 5-325 MG tablet Take 1-2 tablets by mouth every 4 (four) hours as needed for moderate pain. 30 tablet 0  . methocarbamol (ROBAXIN) 500 MG tablet Take 1 tablet (500 mg total) by mouth every 6 (six) hours as needed for muscle spasms. 60 tablet 0  . metoprolol succinate (TOPROL-XL) 25 MG 24 hr tablet Take 0.5 tablets (12.5 mg total) by mouth daily. 30 tablet 0  . mirabegron ER (MYRBETRIQ) 50 MG TB24 tablet Take 1 tablet (50 mg total) by mouth daily. 30 tablet 3  . Multiple Vitamin (MULTIVITAMIN WITH MINERALS) TABS tablet Take 1 tablet by mouth daily.    . pantoprazole (PROTONIX) 40 MG tablet Take 1 tablet (40 mg total) by mouth daily. 30 tablet 0  . polyethylene glycol (MIRALAX / GLYCOLAX) 17 g packet Take 17 g by mouth daily as needed for mild constipation. 14 each 0  . rosuvastatin (CRESTOR) 40 MG tablet Take 1 tablet (40 mg total) by mouth daily. 90 tablet 2  . sertraline (ZOLOFT) 100 MG tablet Take 2 tablets (200 mg total) by mouth daily. 30 tablet 0  . tamsulosin (FLOMAX) 0.4 MG CAPS capsule Take 1 capsule (0.4 mg total) by mouth at bedtime. 30 capsule 1  . torsemide (DEMADEX) 10 MG tablet Take 1 tablet (10 mg total) by mouth daily. Take additional dose as needed for leg swellings/shortness of breath /weight gain 30 tablet  0   No current facility-administered medications for this visit.     Past Medical History:  Diagnosis Date  . (HFpEF) heart failure with preserved ejection fraction ([place])    a. 05/2013 Echo: EF 55%, mild LVH, diast dysfxn, Ao sclerosis, mildly dil LA, RV dysfxn (poorly visualized), PASP 47mmHg;  b. 03/2017 Echo: EF 55-60%, no rwma, triv MR, mildly dil RV, mod TR, PASP 47mmHg.  . Atrial fibrillation [facility])    s/p Cox Maze 1/09;  Multaq Rx d/c'd in 2014 due to pulmo fibrosis;  coumadin d/c'd  in 2014 due to spontaneous subdural hematoma  . BPH (benign prostatic hyperplasia)   . CAD (coronary artery disease), native coronary artery    a. s/p CABG 12/2007;  b. Myoview 12/2011: EF 66%, no scar or ischemia; normal.  . DM (diabetes mellitus) ([place])   . Hyperlipidemia type II   . Hypertension   . 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 OSA (obstructive sleep apnea)   . Pacemaker    PPM - St. Jude  . Peripheral neuropathy 07/31/2018  . Pulmonary fibrosis ([place])    Multaq d/c'd 7/14  . Subdural hematoma ([place]) 07/2012   spontaneous;  coumadin d/c'd => no longer a candidate for anticoagulation    Past Surgical History:  Procedure Laterality Date  . AMPUTATION Left 05/17/2019   Procedure: AMPUTATION LEFT FOURTH TOE;  Surgeon: Meredith Pel, MD;  Location: [place];  Service: Orthopedics;  Laterality: Left;  . AMPUTATION TOE Right 02/06/2020   Procedure: AMPUTATION TOE;  Surgeon: Newt Minion, MD;  Location: [place];  Service: Orthopedics;  Laterality: Right;  . APPENDECTOMY    . CHOLECYSTECTOMY    . CORONARY ARTERY BYPASS GRAFT     x3 (left internal mammary artery to distal left anterior descending coronary artery, saphenous vain graft to second circumflex marginal branch, saphenous vain graft to posterior descending coronary artery, endoscopic saphenous vain harvest from right thigh) and modified Cox - Maze IV procedure.  [name],MD. Electronically signed CHO/MEDQ D: 01/09/2008/ JOB: [id number] cc:  [name]  . [name]  07/30/2012   Procedure: CRANIOTOMY HEMATOMA EVACUATION SUBDURAL;  Surgeon: [name], MD;  Location: [place] NEURO ORS;  Service: Neurosurgery;  Laterality: Right;  Right craniotomy for evacuation of subdural hematoma  . FOOT SURGERY    . HERNIA REPAIR    . INTRAMEDULLARY (IM) NAIL INTERTROCHANTERIC Right 02/04/2020   Procedure: INTRAMEDULLARY (IM) NAIL INTERTROCHANTRIC;  Surgeon: Netta Cedars, MD;  Location: [place];   Service: Orthopedics;  Laterality: Right;  . ORCHIECTOMY     Left  /  testicular cancer  . PACEMAKER PLACEMENT     PPM - St. Jude  . PPM GENERATOR CHANGEOUT N/A 06/25/2019   Procedure: PPM GENERATOR CHANGEOUT;  Surgeon: Evans Lance, MD;  Location: [place] CV LAB;  Service: Cardiovascular;  Laterality: N/A;    Social History   Socioeconomic History  . Marital status: Married    Spouse name: [name]  . Number of children: 2  . Years of education: Not on file  . Highest education level: Not on file  Occupational History  . Occupation: Retired- IT trainer  Tobacco Use  . Smoking status: Former Smoker    Packs/day: 2.00    Years: 20.00    Pack years: 40.00    Types: Cigarettes    Quit date: 02/21/1991    Years since quitting: 29.1  . Smokeless tobacco: Never Used  Substance  and Sexual Activity  . Alcohol use: No    Alcohol/week: 0.0 standard drinks  . Drug use: No  . Sexual activity: Not Currently  Other Topics Concern  . Not on file  Social History Narrative   Lives with wife   Right handed    Married with two children.     He is a Engineer, structural.     Social Determinants of Health   Financial Resource Strain:   . Difficulty of Paying Living Expenses:   Food Insecurity:   . Worried About Charity fundraiser in the Last Year:   . Arboriculturist in the Last Year:   Transportation Needs:   . Film/video editor (Medical):   [name] Lack of Transportation (Non-Medical):   Physical Activity:   . Days of Exercise per Week:   . Minutes of Exercise per Session:   Stress:   . Feeling of Stress :   Social Connections:   . Frequency of Communication with Friends and Family:   . Frequency of Social Gatherings with Friends and Family:   . Attends Religious Services:   . Active Member of Clubs or Organizations:   . Attends Archivist Meetings:   [name] Marital Status:   Intimate Partner Violence:   . Fear of Current or Ex-Partner:   . Emotionally Abused:   [name]  Physically Abused:   . Sexually Abused:     Family History  Problem Relation Age of Onset  . Heart disease Father   . Heart attack Father   . Heart failure Father   . Heart disease Mother   . Alzheimer's disease Mother   . Dementia Mother     ROS: no fevers or chills, productive cough, hemoptysis, dysphasia, odynophagia, melena, hematochezia, dysuria, hematuria, rash, seizure activity, orthopnea, PND, claudication. Remaining systems are negative.  Physical Exam: Well-developed well-nourished in no acute distress.  Skin is warm and dry.  HEENT is normal.  Neck is supple.  Chest is clear to auscultation with normal expansion.  Cardiovascular exam is irregular Abdominal exam nontender or distended. No masses palpated. Extremities show 1+ edema. neuro grossly intac  A/P  1 chronic right-sided congestive heart failure-mildly volume overloaded on examination.  Will change Demadex to 2 tabs in the morning and 1 tab in the evening.  Right-sided CHF is felt secondary to a combination of pulmonary venous hypertension, pulmonary fibrosis and sleep apnea.  Check potassium and renal function in 2 weeks.  2 paroxysmal atrial fibrillation-we will continue beta-blocker.  He is not on anticoagulation given history of spontaneous subdural hematoma.  3 ectopic atrial tachycardia-continue beta-blocker.  4 severe tricuspid regurgitation-possibly secondary to pulmonary hypertension with right ventricular enlargement versus trauma from prior pacemaker.  He is not a surgical candidate given multiple comorbidities.  5 coronary artery disease -continue statin.  No aspirin given history of intracranial hemorrhage.  6 pacemaker-managed by electrophysiology.  7 hyperlipidemia-continue statin.  8 cirrhosis-Per primary care.  9 pulmonary fibrosis  10 chronic anemia/thrombocytopenia-this is felt secondary to cirrhosis with splenomegaly, anemia of chronic disease and renal insufficiency.  Follow-up  hematology.  Kirk Ruths, MD

## 2020-04-05 ENCOUNTER — Ambulatory Visit (INDEPENDENT_AMBULATORY_CARE_PROVIDER_SITE_OTHER): Payer: Medicare Other | Admitting: Cardiology

## 2020-04-05 ENCOUNTER — Other Ambulatory Visit: Payer: Self-pay

## 2020-04-05 ENCOUNTER — Encounter: Payer: Self-pay | Admitting: Cardiology

## 2020-04-05 VITALS — BP 122/76 | HR 95 | Temp 97.3°F | Resp 20 | Ht 68.0 in | Wt 182.6 lb

## 2020-04-05 DIAGNOSIS — I2510 Atherosclerotic heart disease of native coronary artery without angina pectoris: Secondary | ICD-10-CM | POA: Diagnosis not present

## 2020-04-05 DIAGNOSIS — I480 Paroxysmal atrial fibrillation: Secondary | ICD-10-CM | POA: Diagnosis not present

## 2020-04-05 DIAGNOSIS — I10 Essential (primary) hypertension: Secondary | ICD-10-CM | POA: Diagnosis not present

## 2020-04-05 DIAGNOSIS — I50812 Chronic right heart failure: Secondary | ICD-10-CM | POA: Diagnosis not present

## 2020-04-05 NOTE — Patient Instructions (Signed)
Medication Instructions:  NO CHANGE *If you need a refill on your cardiac medications before your next appointment, please call your pharmacy*   Lab Work: Your physician recommends that you return for lab work in: [place] If you have labs (blood work) drawn today and your tests are completely normal, you will receive your results only by: [name] MyChart Message (if you have MyChart) OR . A paper copy in the mail If you have any lab test that is abnormal or we need to change your treatment, we will call you to review the results.   Follow-Up: At [facility], you and your health needs are our priority.  As part of our continuing mission to provide you with exceptional heart care, we have created designated Provider Care Teams.  These Care Teams include your primary Cardiologist (physician) and Advanced Practice Providers (APPs -  Physician Assistants and Nurse Practitioners) who all work together to provide you with the care you need, when you need it.  We recommend signing up for the patient portal called "MyChart".  Sign up information is provided on this After Visit Summary.  MyChart is used to connect with patients for Virtual Visits (Telemedicine).  Patients are able to view lab/test results, encounter notes, upcoming appointments, etc.  Non-urgent messages can be sent to your provider as well.   To learn more about what you can do with MyChart, go to [email or web address].    Your next appointment:   4 month(s)  The format for your next appointment:   In Person  Provider:   Kirk Ruths, MD

## 2020-04-06 DIAGNOSIS — C44311 Basal cell carcinoma of skin of nose: Secondary | ICD-10-CM | POA: Diagnosis not present

## 2020-04-06 DIAGNOSIS — Z85828 Personal history of other malignant neoplasm of skin: Secondary | ICD-10-CM | POA: Diagnosis not present

## 2020-04-07 ENCOUNTER — Telehealth: Payer: Self-pay | Admitting: *Deleted

## 2020-04-07 DIAGNOSIS — I50812 Chronic right heart failure: Secondary | ICD-10-CM

## 2020-04-07 MED ORDER — TORSEMIDE 20 MG PO TABS: ORAL_TABLET | ORAL | Status: DC

## 2020-04-07 NOTE — Telephone Encounter (Signed)
Spoke with pt, confirmed his Demadex is 20 mg tablets, he will continue 2 tablets in the am and 1 tablet in the pm as discussed at his office visit. Medication list updated.

## 2020-04-13 DIAGNOSIS — S72141D Displaced intertrochanteric fracture of right femur, subsequent encounter for closed fracture with routine healing: Secondary | ICD-10-CM | POA: Diagnosis not present

## 2020-04-13 DIAGNOSIS — E1122 Type 2 diabetes mellitus with diabetic chronic kidney disease: Secondary | ICD-10-CM | POA: Diagnosis not present

## 2020-04-13 DIAGNOSIS — L570 Actinic keratosis: Secondary | ICD-10-CM | POA: Diagnosis not present

## 2020-04-13 DIAGNOSIS — I50812 Chronic right heart failure: Secondary | ICD-10-CM | POA: Diagnosis not present

## 2020-04-13 DIAGNOSIS — I130 Hypertensive heart and chronic kidney disease with heart failure and stage 1 through stage 4 chronic kidney disease, or unspecified chronic kidney disease: Secondary | ICD-10-CM | POA: Diagnosis not present

## 2020-04-13 DIAGNOSIS — Z4781 Encounter for orthopedic aftercare following surgical amputation: Secondary | ICD-10-CM | POA: Diagnosis not present

## 2020-04-13 DIAGNOSIS — I5032 Chronic diastolic (congestive) heart failure: Secondary | ICD-10-CM | POA: Diagnosis not present

## 2020-04-21 DIAGNOSIS — I50812 Chronic right heart failure: Secondary | ICD-10-CM | POA: Diagnosis not present

## 2020-04-22 LAB — BASIC METABOLIC PANEL
BUN/Creatinine Ratio: 16 (ref 10–24)
BUN: 25 mg/dL (ref 8–27)
CO2: 28 mmol/L (ref 20–29)
Calcium: 9.3 mg/dL (ref 8.6–10.2)
Chloride: 100 mmol/L (ref 96–106)
Creatinine, Ser: 1.55 mg/dL — ABNORMAL HIGH (ref 0.76–1.27)
GFR calc Af Amer: 49 mL/min/{1.73_m2} — ABNORMAL LOW (ref >59)
GFR calc non Af Amer: 43 mL/min/{1.73_m2} — ABNORMAL LOW (ref >59)
Glucose: 101 mg/dL — ABNORMAL HIGH (ref 65–99)
Potassium: 4.2 mmol/L (ref 3.5–5.2)
Sodium: 141 mmol/L (ref 134–144)

## 2020-04-27 DIAGNOSIS — Z4789 Encounter for other orthopedic aftercare: Secondary | ICD-10-CM | POA: Diagnosis not present

## 2020-04-30 ENCOUNTER — Inpatient Hospital Stay: Payer: Medicare Other | Attending: Hematology and Oncology

## 2020-04-30 ENCOUNTER — Other Ambulatory Visit: Payer: Self-pay

## 2020-04-30 DIAGNOSIS — D509 Iron deficiency anemia, unspecified: Secondary | ICD-10-CM

## 2020-04-30 DIAGNOSIS — K760 Fatty (change of) liver, not elsewhere classified: Secondary | ICD-10-CM | POA: Diagnosis not present

## 2020-04-30 DIAGNOSIS — D696 Thrombocytopenia, unspecified: Secondary | ICD-10-CM | POA: Insufficient documentation

## 2020-04-30 DIAGNOSIS — Z8547 Personal history of malignant neoplasm of testis: Secondary | ICD-10-CM | POA: Diagnosis not present

## 2020-04-30 DIAGNOSIS — Z923 Personal history of irradiation: Secondary | ICD-10-CM | POA: Diagnosis not present

## 2020-04-30 DIAGNOSIS — Z79899 Other long term (current) drug therapy: Secondary | ICD-10-CM | POA: Diagnosis not present

## 2020-04-30 DIAGNOSIS — D638 Anemia in other chronic diseases classified elsewhere: Secondary | ICD-10-CM | POA: Diagnosis not present

## 2020-04-30 DIAGNOSIS — R162 Hepatomegaly with splenomegaly, not elsewhere classified: Secondary | ICD-10-CM | POA: Insufficient documentation

## 2020-04-30 DIAGNOSIS — D472 Monoclonal gammopathy: Secondary | ICD-10-CM | POA: Insufficient documentation

## 2020-04-30 LAB — CBC WITH DIFFERENTIAL/PLATELET
Abs Immature Granulocytes: 0.02 10*3/uL (ref 0.00–0.07)
Basophils Absolute: 0.1 10*3/uL (ref 0.0–0.1)
Basophils Relative: 1 %
Eosinophils Absolute: 0.3 10*3/uL (ref 0.0–0.5)
Eosinophils Relative: 6 %
HCT: 33.4 % — ABNORMAL LOW (ref 39.0–52.0)
Hemoglobin: 10.8 g/dL — ABNORMAL LOW (ref 13.0–17.0)
Immature Granulocytes: 0 %
Lymphocytes Relative: 18 %
Lymphs Abs: 0.9 10*3/uL (ref 0.7–4.0)
MCH: 30.6 pg (ref 26.0–34.0)
MCHC: 32.3 g/dL (ref 30.0–36.0)
MCV: 94.6 fL (ref 80.0–100.0)
Monocytes Absolute: 0.2 10*3/uL (ref 0.1–1.0)
Monocytes Relative: 4 %
Neutro Abs: 3.5 10*3/uL (ref 1.7–7.7)
Neutrophils Relative %: 71 %
Platelets: 77 10*3/uL — ABNORMAL LOW (ref 150–400)
RBC: 3.53 MIL/uL — ABNORMAL LOW (ref 4.22–5.81)
RDW: 14.9 % (ref 11.5–15.5)
WBC: 4.9 10*3/uL (ref 4.0–10.5)
nRBC: 0 % (ref 0.0–0.2)

## 2020-04-30 LAB — IRON AND TIBC
Iron: 48 ug/dL (ref 42–163)
Saturation Ratios: 17 % — ABNORMAL LOW (ref 20–55)
TIBC: 293 ug/dL (ref 202–409)
UIBC: 244 ug/dL (ref 117–376)

## 2020-04-30 LAB — FERRITIN: Ferritin: 142 ng/mL (ref 24–336)

## 2020-05-07 ENCOUNTER — Encounter: Payer: Self-pay | Admitting: Hematology and Oncology

## 2020-05-07 ENCOUNTER — Telehealth: Payer: Self-pay | Admitting: Hematology and Oncology

## 2020-05-07 ENCOUNTER — Inpatient Hospital Stay (HOSPITAL_BASED_OUTPATIENT_CLINIC_OR_DEPARTMENT_OTHER): Payer: Medicare Other | Admitting: Hematology and Oncology

## 2020-05-07 ENCOUNTER — Other Ambulatory Visit: Payer: Self-pay

## 2020-05-07 VITALS — BP 115/73 | HR 90 | Temp 98.3°F | Resp 18 | Ht 68.0 in | Wt 185.0 lb

## 2020-05-07 DIAGNOSIS — D638 Anemia in other chronic diseases classified elsewhere: Secondary | ICD-10-CM | POA: Diagnosis not present

## 2020-05-07 DIAGNOSIS — I2510 Atherosclerotic heart disease of native coronary artery without angina pectoris: Secondary | ICD-10-CM

## 2020-05-07 DIAGNOSIS — D472 Monoclonal gammopathy: Secondary | ICD-10-CM | POA: Diagnosis not present

## 2020-05-07 DIAGNOSIS — D696 Thrombocytopenia, unspecified: Secondary | ICD-10-CM

## 2020-05-07 DIAGNOSIS — Z8547 Personal history of malignant neoplasm of testis: Secondary | ICD-10-CM | POA: Diagnosis not present

## 2020-05-07 DIAGNOSIS — R162 Hepatomegaly with splenomegaly, not elsewhere classified: Secondary | ICD-10-CM | POA: Diagnosis not present

## 2020-05-07 DIAGNOSIS — D509 Iron deficiency anemia, unspecified: Secondary | ICD-10-CM | POA: Diagnosis not present

## 2020-05-07 DIAGNOSIS — K760 Fatty (change of) liver, not elsewhere classified: Secondary | ICD-10-CM | POA: Diagnosis not present

## 2020-05-07 NOTE — Assessment & Plan Note (Signed)
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 Even though the CT imaging report from September 2017 did not disclose this, I reviewed the imaging study with the patient extensively which clearly showed mild liver enlargement and splenomegaly. Rarely, autoimmune disorder that could cause pulmonary fibrosis can also cause mild thrombocytopenia. He is not symptomatic.  Previous autoimmune screen, hepatitis C screening and serum vitamin B12 were within normal limits Observe only for now 

## 2020-05-07 NOTE — Telephone Encounter (Signed)
Scheduled per 5/21 sch message. Mailing calendar to pt.

## 2020-05-07 NOTE — Progress Notes (Signed)
[name] OFFICE PROGRESS NOTE  Patient Care Team: [name], [name], [name], [name], MD as Consulting Physician (Cardiology) Jamie Morning, DO as Referring Physician (Family Medicine)  ASSESSMENT & PLAN:  Thrombocytopenia [facility]) The most likely cause of his chronic thrombocytopenia is 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 Even though the CT imaging report from September 2017 did not disclose this, I reviewed the imaging study with the patient extensively which clearly showed mild liver enlargement and splenomegaly. Rarely, autoimmune disorder that could cause pulmonary fibrosis can also cause mild thrombocytopenia. He is not symptomatic.  Previous autoimmune screen, hepatitis C screening and serum vitamin B12 were within normal limits Observe only for now  Anemia, chronic disease He has multifactorial deficiency anemia His iron studies are adequate We will continue to check his iron studies in his next visit  [name] (monoclonal gammopathy of unknown significance) He will continue [name] blood work once a year, due at the end of the year   Orders Placed This Encounter  Procedures  . CBC with Differential/Platelet    Standing Status:   Future    Standing Expiration Date:   05/07/2021  . Comprehensive metabolic panel    Standing Status:   Future    Standing Expiration Date:   05/07/2021  . Iron and TIBC    Standing Status:   Future    Standing Expiration Date:   05/07/2021  . Ferritin    Standing Status:   Future    Standing Expiration Date:   05/07/2021  . Kappa/lambda light chains    Standing Status:   Future    Standing Expiration Date:   05/07/2021  . Multiple Myeloma Panel (SPEP&IFE w/QIG)    Standing Status:   Future    Standing Expiration Date:   05/07/2021    All questions were answered. The patient knows to call the clinic with any problems, questions or  concerns. The total time spent in the appointment was 15 minutes encounter with patients including review of chart and various tests results, discussions about plan of care and coordination of care plan   Jamie Lark, MD 05/07/2020 10:00 AM  INTERVAL HISTORY: Please see below for problem oriented charting. He returns for further follow-up by himself He had a fall several months ago but is now using a cane on a regular basis He continues to have gait imbalance The patient denies any recent signs or symptoms of bleeding such as spontaneous epistaxis, hematuria or hematochezia.   SUMMARY OF ONCOLOGIC HISTORY:  [name] [name]. is here because of thrombocytopenia. 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  He was found to have abnormal CBC from abnormal blood work from routine blood work monitoring. I have reviewed outside records from his primary care doctor.  He is noted to have mild pancytopenia since 11/17/2015. On 11/17/2015, white blood cell count 5.9, hemoglobin 12.7 and platelet count 106 On 03/13/2016, white blood cell count 6.6, hemoglobin 12.6 and platelet count 103 On 09/06/2015, white blood cell count 6.6, hemoglobin 11.9 and platelet count 102 On 10/25/2016, white count 5.2, hemoglobin 12.7 and platelet count 86 On 11/08/16, white blood cell count 5.3, hemoglobin 12.2 implant, 107. On 09/05/2016, serum ferritin was high at 220, iron study showed serum iron low at 24 and 9% iron saturation.  Some older scanned records dated  back to 12/13/2011 showed low platelet count of 101 On 12/21/2011, he had normal platelet count of 210 On 07/30/2012, his platelet count was borderline low at 143  On 09/08/2016, CT scan of the abdomen and pelvis showed nodular hepatic contour suspicious for possible liver cirrhosis. He is noted to have splenomegaly although this was not reported on  the CT (by my review) He denies bleeding, such as spontaneous epistaxis, hematuria, 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  He is being observed Repeat CT imaging of the abdomen and pelvis in August 2019 confirmed liver cirrhosis and splenomegaly He was found to have iron deficiency anemia and was placed on oral iron supplement The patient also have [name] and is being observed In October 2020, he received intravenous iron infusion  REVIEW OF SYSTEMS:   Constitutional: Denies fevers, chills or abnormal weight loss Eyes: Denies blurriness of vision Ears, nose, mouth, throat, and face: Denies mucositis or sore throat Respiratory: Denies cough, dyspnea or wheezes Cardiovascular: Denies palpitation, chest discomfort or lower extremity swelling Gastrointestinal:  Denies nausea, heartburn or change in bowel habits Skin: Denies abnormal skin rashes Lymphatics: Denies new lymphadenopathy  Behavioral/Psych: Mood is stable, no new changes  All other systems were reviewed with the patient and are negative.  I have reviewed the past medical history, past surgical history, social history and family history with the patient and they are unchanged from previous note.  ALLERGIES:  is allergic to aricept [donepezil]; lipitor [atorvastatin]; nsaids; warfarin and related; and enbrel [etanercept].  MEDICATIONS:  Current Outpatient Medications  Medication Sig Dispense Refill  . acetaminophen (TYLENOL) 325 MG tablet Take 1-2 tablets (325-650 mg total) by mouth  every 4 (four) hours as needed for mild pain.    [name] allopurinol (ZYLOPRIM) 300 MG tablet Take 1 tablet (300 mg total) by mouth daily. 30 tablet 0  . Baclofen 5 MG TABS 1 TABLET AS NEEDED TWICE A DAY AS NEEDED FOR MUSCLE SPASM ORALLY 30 DAY(S)    . docusate sodium (COLACE) 100 MG capsule Take 1 capsule (100 mg total) by mouth 2 (two) times daily. 10 capsule 0  . donepezil (ARICEPT) 10 MG tablet Take 10 mg by mouth at bedtime.    [name] doxycycline (VIBRAMYCIN) 100 MG capsule Take 100 mg by mouth 2 (two) times daily.    . ferrous sulfate 325 (65 FE) MG tablet Take 1 tablet (325 mg total) by mouth daily with breakfast. 30 tablet 3  . gabapentin (NEURONTIN) 100 MG capsule Take 100 mg by mouth 3 (three) times daily.    [name] HYDROcodone-acetaminophen (NORCO/VICODIN) 5-325 MG tablet Take 1-2 tablets by mouth every 4 (four) hours as needed for moderate pain. 30 tablet 0  . methocarbamol (ROBAXIN) 500 MG tablet Take 1 tablet (500 mg total) by mouth every 6 (six) hours as needed for muscle spasms. 60 tablet 0  . metoprolol succinate (TOPROL-XL) 25 MG 24 hr tablet Take 0.5 tablets (12.5 mg total) by mouth daily. 30 tablet 0  . mirabegron ER (MYRBETRIQ) 50 MG TB24 tablet Take 1 tablet (50 mg total) by mouth daily.  30 tablet 3  . Multiple Vitamin (MULTIVITAMIN WITH MINERALS) TABS tablet Take 1 tablet by mouth daily.    . pantoprazole (PROTONIX) 40 MG tablet Take 1 tablet (40 mg total) by mouth daily. 30 tablet 0  . polyethylene glycol (MIRALAX / GLYCOLAX) 17 g packet Take 17 g by mouth daily as needed for mild constipation. 14 each 0  . rosuvastatin (CRESTOR) 40 MG tablet Take 1 tablet (40 mg total) by mouth daily. 90 tablet 2  . sertraline (ZOLOFT) 100 MG tablet Take 2 tablets (200 mg total) by mouth daily. 30 tablet 0  . tamsulosin (FLOMAX) 0.4 MG CAPS capsule Take 1 capsule (0.4 mg total) by mouth at bedtime. 30 capsule 1  . torsemide (DEMADEX) 20 MG tablet Take 2 tablets in the [name] and 1 tablet in the afternoon  Take additional dose as needed for leg swellings/shortness of breath /weight gain     No current facility-administered medications for this visit.    PHYSICAL EXAMINATION: ECOG PERFORMANCE STATUS: 2 - Symptomatic, <50% confined to bed  Vitals:   05/07/20 0910  BP: 115/73  Pulse: 90  Resp: 18  Temp: 98.3 F (36.8 C)  SpO2: 100%   Filed Weights   05/07/20 0910  Weight: 185 lb (83.9 kg)    GENERAL:alert, no distress and comfortable NEURO: alert & oriented x 3 with fluent speech, no focal motor/sensory deficits  LABORATORY DATA:  I have reviewed the data as listed    Component Value Date/Time   NA 141 04/21/2020 1628   NA 141 02/23/2017 1237   K 4.2 04/21/2020 1628   K 3.8 02/23/2017 1237   CL 100 04/21/2020 1628   CO2 28 04/21/2020 1628   CO2 27 02/23/2017 1237   GLUCOSE 101 (H) 04/21/2020 1628   GLUCOSE 93 02/10/2020 0552   GLUCOSE 113 02/23/2017 1237   BUN 25 04/21/2020 1628   BUN 18.3 02/23/2017 1237   CREATININE 1.55 (H) 04/21/2020 1628   CREATININE 1.2 02/23/2017 1237   CALCIUM 9.3 04/21/2020 1628   CALCIUM 9.6 02/23/2017 1237   PROT 6.7 02/10/2020 0552   PROT 7.3 06/18/2018 1205   PROT 8.0 02/23/2017 1237   ALBUMIN 3.1 (L) 02/10/2020 0552   ALBUMIN 4.1 09/24/2017 0824   ALBUMIN 4.1 02/23/2017 1237   AST 25 02/10/2020 0552   AST 24 02/23/2017 1237   ALT 10 02/10/2020 0552   ALT 15 02/23/2017 1237   ALKPHOS 69 02/10/2020 0552   ALKPHOS 105 02/23/2017 1237   BILITOT 1.0 02/10/2020 0552   BILITOT 0.6 09/24/2017 0824   BILITOT 0.98 02/23/2017 1237   GFRNONAA 43 (L) 04/21/2020 1628   GFRAA 49 (L) 04/21/2020 1628    No results found for: SPEP, UPEP  Lab Results  Component Value Date   WBC 4.9 04/30/2020   NEUTROABS 3.5 04/30/2020   HGB 10.8 (L) 04/30/2020   HCT 33.4 (L) 04/30/2020   MCV 94.6 04/30/2020   PLT 77 (L) 04/30/2020      Chemistry      Component Value Date/Time   NA 141 04/21/2020 1628   NA 141 02/23/2017 1237   K 4.2 04/21/2020  1628   K 3.8 02/23/2017 1237   CL 100 04/21/2020 1628   CO2 28 04/21/2020 1628   CO2 27 02/23/2017 1237   BUN 25 04/21/2020 1628   BUN 18.3 02/23/2017 1237   CREATININE 1.55 (H) 04/21/2020 1628   CREATININE 1.2 02/23/2017 1237      Component Value Date/Time  CALCIUM 9.3 04/21/2020 1628   CALCIUM 9.6 02/23/2017 1237   ALKPHOS 69 02/10/2020 0552   ALKPHOS 105 02/23/2017 1237   AST 25 02/10/2020 0552   AST 24 02/23/2017 1237   ALT 10 02/10/2020 0552   ALT 15 02/23/2017 1237   BILITOT 1.0 02/10/2020 0552   BILITOT 0.6 09/24/2017 0824   BILITOT 0.98 02/23/2017 1237

## 2020-05-07 NOTE — Assessment & Plan Note (Signed)
He will continue [name] blood work once a year, due at the end of the year

## 2020-05-07 NOTE — Assessment & Plan Note (Signed)
He has multifactorial deficiency anemia His iron studies are adequate We will continue to check his iron studies in his next visit

## 2020-05-10 ENCOUNTER — Ambulatory Visit: Payer: Medicare Other | Admitting: Cardiology

## 2020-05-24 IMAGING — CT CT HEAD WITHOUT CONTRAST
4 series · 16 of 47 positions shown, 18 images · non-contrast
Comparison: Head CT dated 05/21/2019

CLINICAL DATA: 78-year-old male with fall and head trauma.

EXAM:
CT HEAD WITHOUT CONTRAST
TECHNIQUE: Contiguous axial images were obtained from the base of the skull
through the vertex without intravenous contrast.

[Series 3: head without · axial · non-contrast · 0.44mm/px · z∈[-123,-3]mm · 7 of 32 slices shown, 9 images]
[im 4/32  brain]
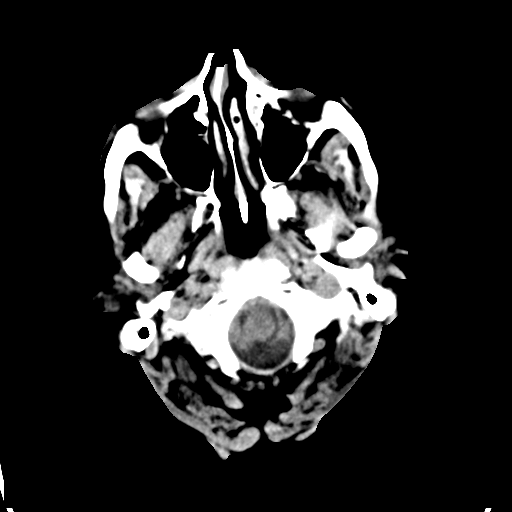
[im 4/32  bone]
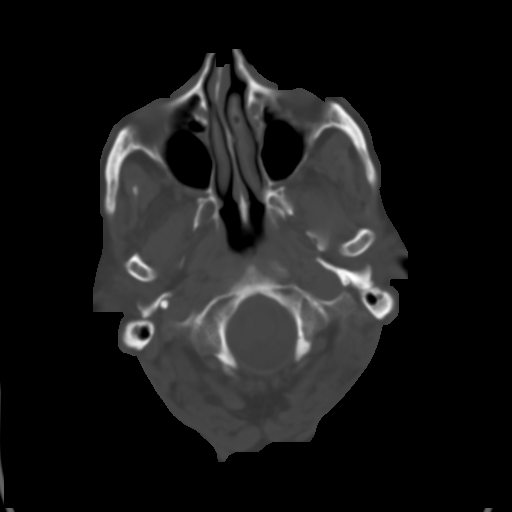
[im 8/32  brain]
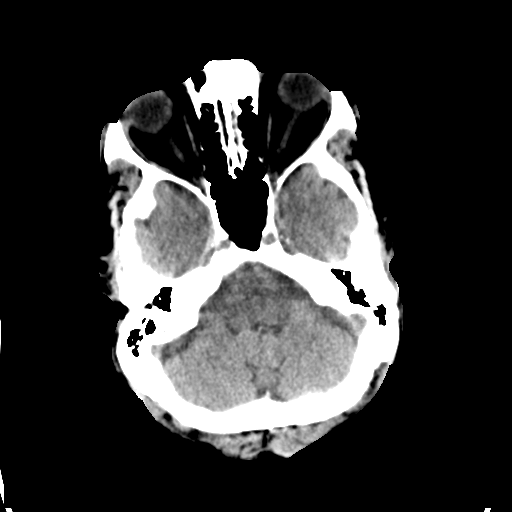
[im 12/32  brain]
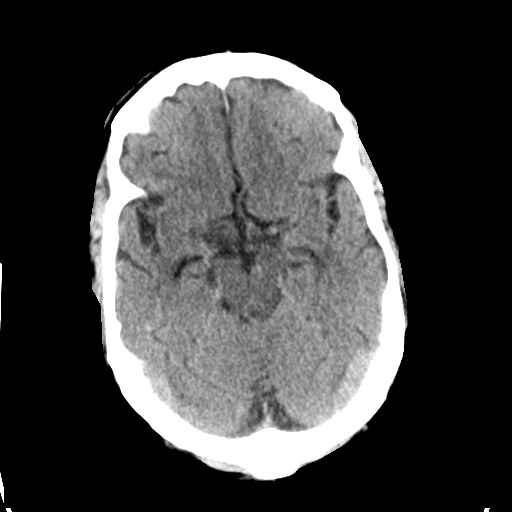
[im 16/32  brain]
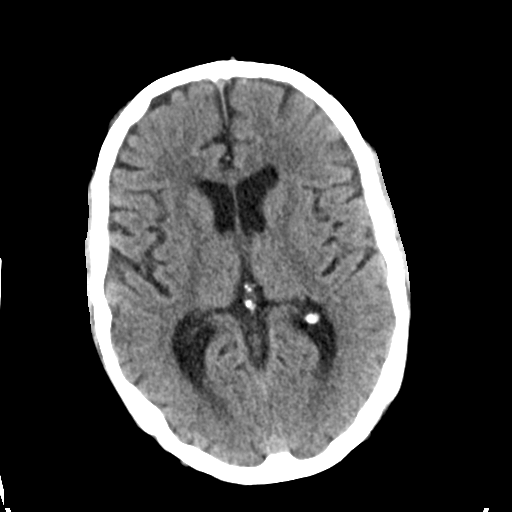
[im 20/32  brain]
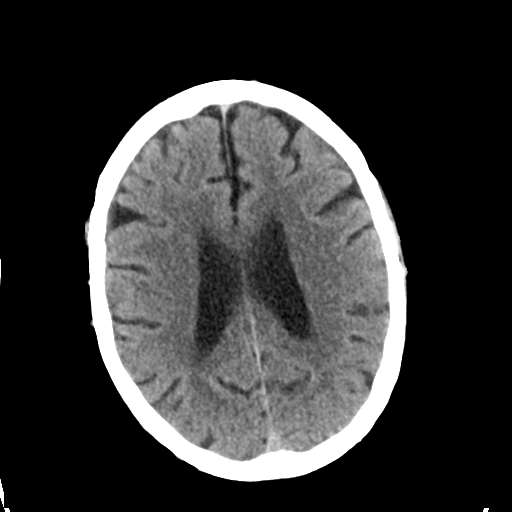
[im 20/32  bone]
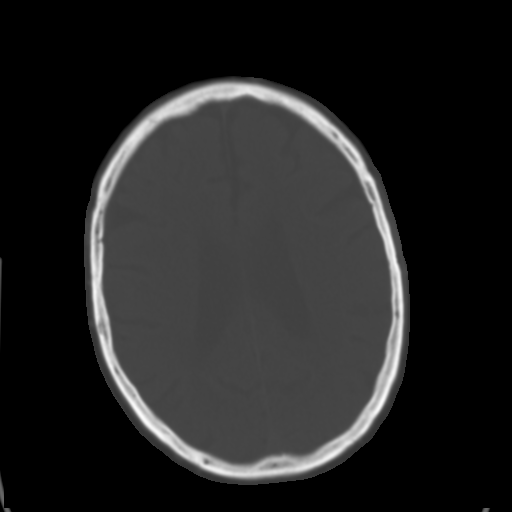
[im 24/32  brain]
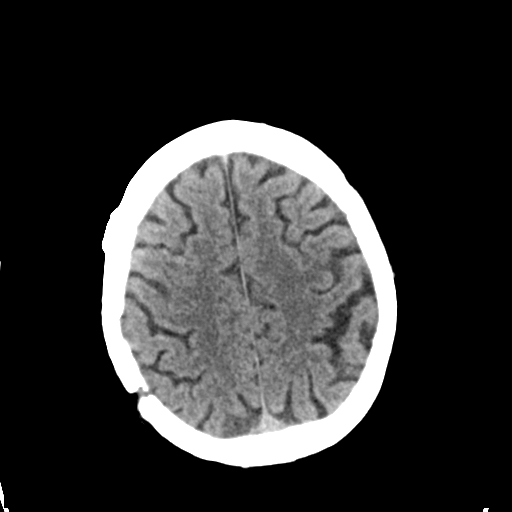
[im 28/32  brain]
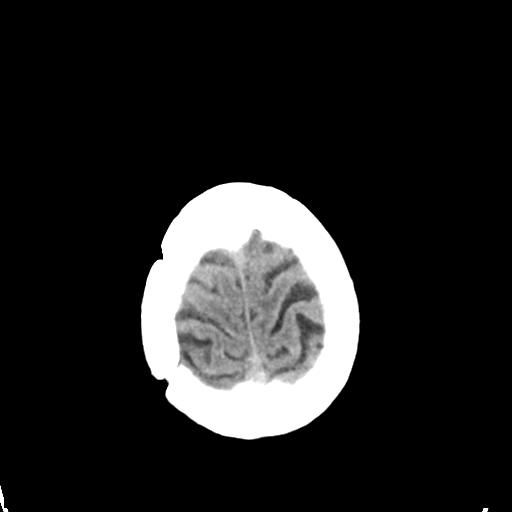

[Series 4: head bone · axial · 0.44mm/px · z∈[-124,-92]mm · 3 of 80 slices shown]
[im 8/80  bone]
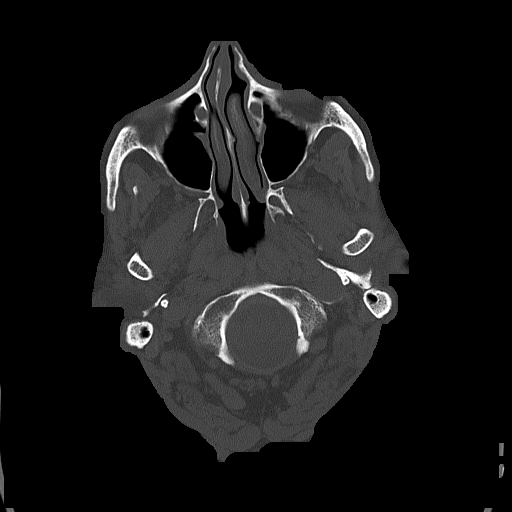
[im 16/80  bone]
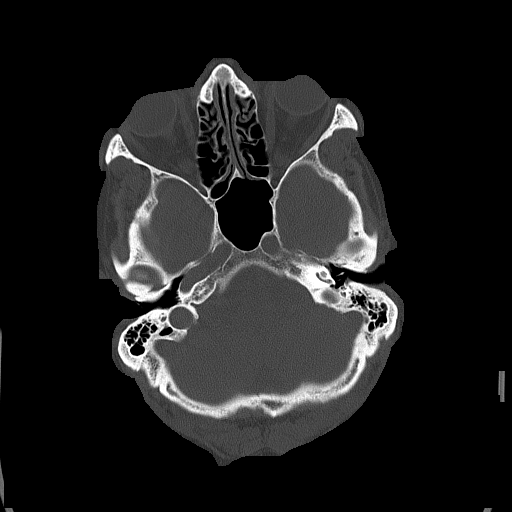
[im 24/80  bone]
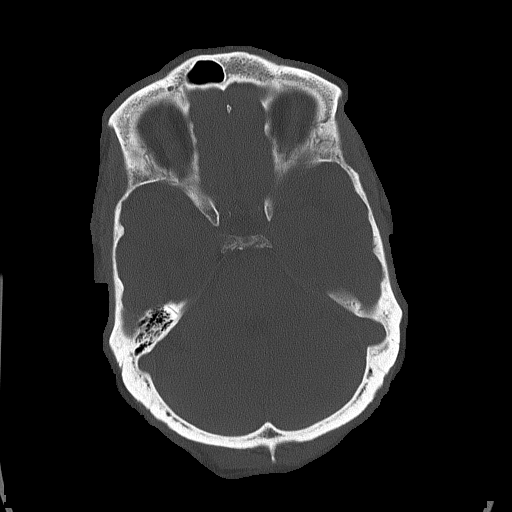

[Series 5: head without cor · coronal · non-contrast · 0.31mm/px · 3 of 73 slices shown]
[im 25/73  brain]
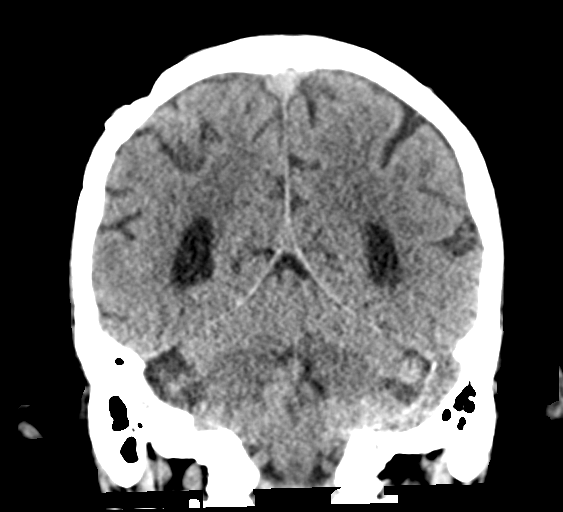
[im 33/73  brain]
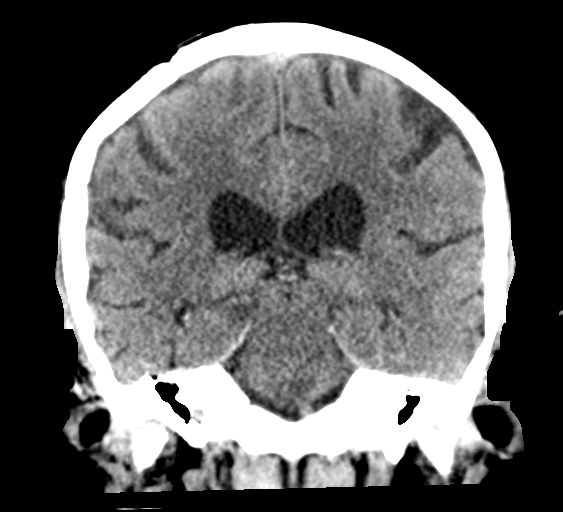
[im 41/73  brain]
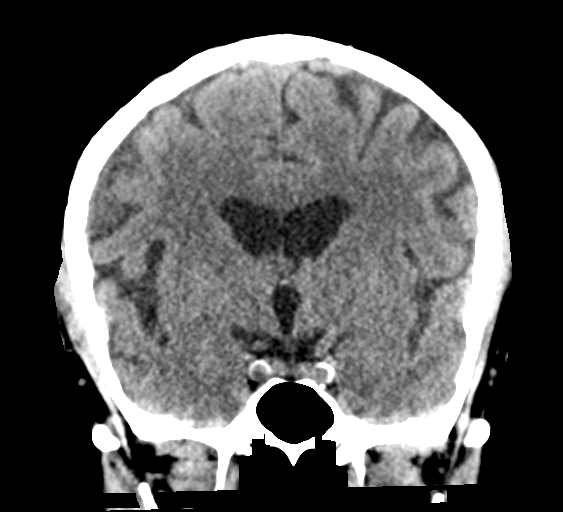

[Series 6: head without sag · sagittal · non-contrast · 0.31mm/px · 3 of 60 slices shown]
[im 20/60  brain]
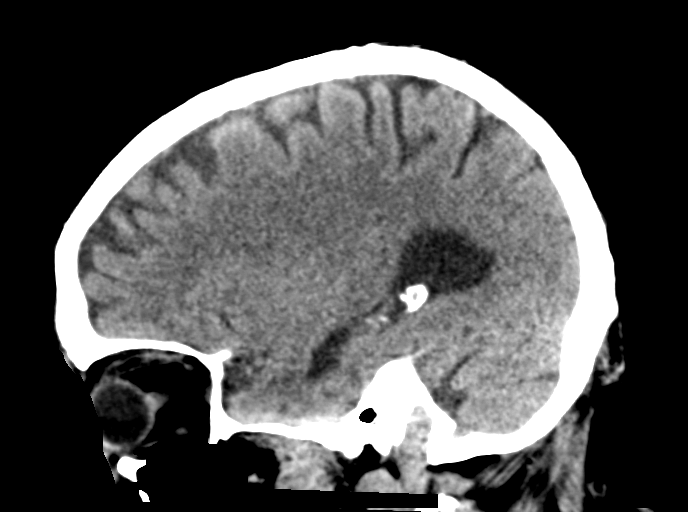
[im 30/60  brain]
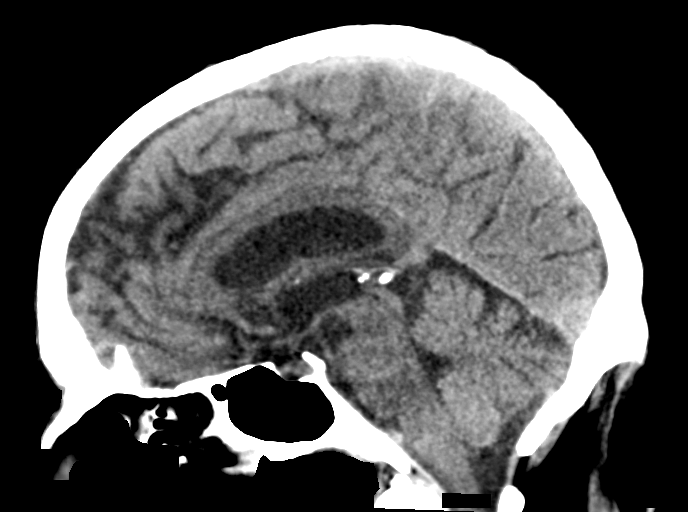
[im 40/60  brain]
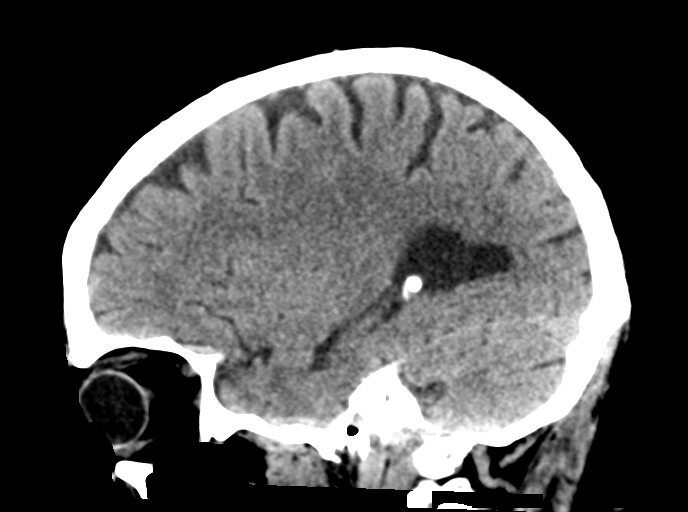

[16 of 47 positions shown; findings below may reference images not displayed]

FINDINGS: Brain: There is mild age-related atrophy and chronic microvascular
ischemic changes. Small old right lentiform nucleus lacunar infarct.
There is no acute intracranial hemorrhage. No mass effect or midline
shift. No extra-axial fluid collection.

Vascular: No hyperdense vessel or unexpected calcification.

Skull: Right parietal craniotomy. No acute calvarial pathology.

Sinuses/Orbits: No acute finding.

Other: Right forehead hematoma.
IMPRESSION: 1. No acute intracranial hemorrhage.
2. Mild age-related atrophy and chronic microvascular ischemic
changes.

## 2020-05-26 ENCOUNTER — Ambulatory Visit (INDEPENDENT_AMBULATORY_CARE_PROVIDER_SITE_OTHER): Payer: Medicare Other | Admitting: Adult Health

## 2020-05-26 DIAGNOSIS — G4733 Obstructive sleep apnea (adult) (pediatric): Secondary | ICD-10-CM

## 2020-05-26 DIAGNOSIS — G4711 Idiopathic hypersomnia with long sleep time: Secondary | ICD-10-CM

## 2020-05-26 DIAGNOSIS — Z9989 Dependence on other enabling machines and devices: Secondary | ICD-10-CM

## 2020-05-26 MED ORDER — MODAFINIL 100 MG PO TABS: 50.0000 mg | ORAL_TABLET | Freq: Every day | ORAL | 2 refills | Status: DC

## 2020-05-26 NOTE — Progress Notes (Signed)
  [facility] [address] [place]. [place] [address] [phone]     Virtual Visit via Telephone Note  I connected with [name] on 05/26/20 at  9:30 AM EDT by telephone located remotely at [facility] and verified that I am speaking with the correct person using two identifiers who reports being located at home.    Visit scheduled by CMA. She discussed the limitations, risks, security and privacy concerns of performing an evaluation and management service by telephone and the availability of in person appointments. I also discussed with the patient that there may be a patient responsible charge related to this service. The patient expressed understanding and agreed to proceed. See telephone note for consent and additional scheduling information.    History of Present Illness:  [name]. is a 79 y.o. male who has been followed in this office for OSA and hypersomnia. He was initially scheduled for face-to-face office follow up visit today time but due to [name], visit rescheduled for non-face-to-face telephone visit with patients consent. Unable to participate in video visit due to lack of access to device with camera.    Patient CPAP download indicates that he did use his machine nightly for compliance of 100%. He uses machine greater than 4 hours 16 days for compliance of 53%. On average he uses his machine 4 hours and 35 minutes. His residual AHI is 3.7 on 6 to 16 cm of water with EPR of 3. Leak in the 95th percentile is 29.6 L/min.  The patient and his wife state that he continues to nap throughout the day. Wife reports that he typically goes back to bed around 2 PM and will not get up to 5 PM. Reports that he has several naps throughout the day. Patient reports that he would like to be more awake during the day. He is interested in trying medication.    Observations/Objective:  Generalized: Well developed, in no acute distress     Neurological examination  Mentation: Alert oriented to time, place, history taking. Follows all commands speech and language fluent  Assessment and Plan:  Obstructive sleep apnea on CPAP   Compliance is suboptimal-encouraged patient to use machine greater than 4 hours each night  When using the machine residual AHI is treated  Idiopathic hypersomnia   Start Provigil   Follow Up Instructions:   F/U in office in 3 months    I discussed the assessment and treatment plan with the patient.  The patient was provided an opportunity to ask questions and all were answered to their satisfaction. The patient agreed with the plan and verbalized an understanding of the instructions.   I spent 20 minutes of face-to-face and non-face-to-face time with patient.  This included previsit chart review, lab review, study review, order entry, electronic health record documentation, patient education.     Ward Givens NP-C  [facility] [address] [place], [place] [address]  Phone [phone] Fax [phone]

## 2020-06-03 ENCOUNTER — Telehealth: Payer: Self-pay

## 2020-06-03 NOTE — Telephone Encounter (Signed)
Merlin alert- Alert for HVR events. Since last remote 3 HVR longest 3 mins. EGM's appear SVT 170-180's as previously noted. History of PAF, no OAC due to fall risk.   Current meds include Metoprolol 12.5mg  daily.   Spoke with pt spouse( DPR on file) she states pt was working out in yard at time of events.  He did not c/o of any cardiac symptoms.  Denies any current symptoms.  Spouse confirms that pt is compliant with meds as ordered.    Last MD note 09/24/19, known SVT, asymptomatic.  Continue to monitor with watchful waiting.

## 2020-06-08 ENCOUNTER — Telehealth: Payer: Self-pay | Admitting: *Deleted

## 2020-06-08 NOTE — Telephone Encounter (Signed)
Modafinil PA, key: [id number]. Z99.89, G47.33, G47.11. CMM [facility] stated unable to find patient at this time, try again later.

## 2020-06-17 NOTE — Telephone Encounter (Signed)
PA approved. Request Reference Number: [id number]. MODAFINIL TAB 100MG  is approved through 12/18/2020. Your patient may now fill this prescription and it will be covered.

## 2020-06-17 NOTE — Telephone Encounter (Addendum)
Called [facility] pharmacy and got pt pharmacy benefit info: ID: [id number]. RxBIN: [id number]. RxPCN: [id number]. RxGrp: [id number]. Phone [phone]. Initiated a new PA on CMM. EMV:[id number]. In process of completing.  "Brooke Bonito" needs to be after his last name on CMM for them to locate pt.   Submitted PA. Waiting on determination from [facility] Medicare Part D.

## 2020-06-22 ENCOUNTER — Ambulatory Visit (INDEPENDENT_AMBULATORY_CARE_PROVIDER_SITE_OTHER): Payer: Medicare Other | Admitting: *Deleted

## 2020-06-22 DIAGNOSIS — I50812 Chronic right heart failure: Secondary | ICD-10-CM | POA: Diagnosis not present

## 2020-06-22 LAB — CUP PACEART REMOTE DEVICE CHECK
Battery Remaining Longevity: 120 mo
Battery Remaining Percentage: 95.5 %
Battery Voltage: 3.01 V
Brady Statistic AP VP Percent: 6.3 %
Brady Statistic AP VS Percent: 23 %
Brady Statistic AS VP Percent: 1 %
Brady Statistic AS VS Percent: 67 %
Brady Statistic RA Percent Paced: 28 %
Brady Statistic RV Percent Paced: 7.1 %
Date Time Interrogation Session: 20210706020033
Implantable Lead Implant Date: 20090420
Implantable Lead Implant Date: 20090420
Implantable Lead Location: 753859
Implantable Lead Location: 753860
Implantable Pulse Generator Implant Date: 20200708
Lead Channel Impedance Value: 490 Ohm
Lead Channel Impedance Value: 580 Ohm
Lead Channel Pacing Threshold Amplitude: 0.625 V
Lead Channel Pacing Threshold Amplitude: 1 V
Lead Channel Pacing Threshold Pulse Width: 0.4 ms
Lead Channel Pacing Threshold Pulse Width: 0.4 ms
Lead Channel Sensing Intrinsic Amplitude: 0.9 mV
Lead Channel Sensing Intrinsic Amplitude: 8.1 mV
Lead Channel Setting Pacing Amplitude: 0.875
Lead Channel Setting Pacing Amplitude: 2 V
Lead Channel Setting Pacing Pulse Width: 0.4 ms
Lead Channel Setting Sensing Sensitivity: 2 mV
Pulse Gen Model: 2272
Pulse Gen Serial Number: 9149180

## 2020-06-23 NOTE — Progress Notes (Signed)
Remote pacemaker transmission.   

## 2020-06-24 ENCOUNTER — Other Ambulatory Visit: Payer: Self-pay

## 2020-06-24 ENCOUNTER — Encounter: Payer: Self-pay | Admitting: Orthopedic Surgery

## 2020-06-24 ENCOUNTER — Ambulatory Visit (INDEPENDENT_AMBULATORY_CARE_PROVIDER_SITE_OTHER): Payer: Medicare Other | Admitting: Physician Assistant

## 2020-06-24 VITALS — Ht 68.0 in | Wt 185.0 lb

## 2020-06-24 DIAGNOSIS — M869 Osteomyelitis, unspecified: Secondary | ICD-10-CM

## 2020-06-24 DIAGNOSIS — I2510 Atherosclerotic heart disease of native coronary artery without angina pectoris: Secondary | ICD-10-CM

## 2020-06-24 NOTE — Progress Notes (Signed)
Office Visit Note   Patient: [name].           Date of Birth: [birth year]           MRN: [id number] Visit Date: 06/24/2020              Requested by: [name], MD [address] [name],  [place] [address] PCP: [name], MD  Chief Complaint  Patient presents with  . Right Foot - Follow-up    01/2020 2nd toe amputation       HPI: Patient is status post right foot second toe amputation.  He is doing extremely well.  They were concerned because his fourth toenail was discolored.  He does have a history of clawing of his toes.  He is wearing his VIVE compression socks  Assessment & Plan: Visit Diagnoses: No diagnosis found.  Plan: Demonstrated some modified Achilles stretching because of his balance issues.  Also gave him a gel sleeve for his toe but he should be monitoring this that its not too much pressure with his sock.  Will follow up in 1 month or sooner if they have issues  Follow-Up Instructions: No follow-ups on file.   Ortho Exam  Patient is alert, oriented, no adenopathy, well-dressed, normal affect, normal respiratory effort. Dorsalis pedis pulse is palpable.  No swelling no erythema no ascending cellulitis.  Second toe amputation site is completely healed.  Fourth toenail has a pressure blister on the end of it.  No surrounding fluctuance erythema drainage or foul odor.  This was debrided to healthy surface.  There is some blood bleeding beneath this toenail.  No ingrowing of the toenail  Imaging: No results found. No images are attached to the encounter.  Labs: Lab Results  Component Value Date   HGBA1C 5.6 05/21/2019   HGBA1C 5.7 (H) 07/30/2012   HGBA1C  01/07/2008    5.3 (NOTE)   The ADA recommends the following therapeutic goals for glycemic   control related to Hgb A1C measurement:   Goal of Therapy:   < 7.0% Hgb A1C   Action Suggested:  > 8.0% Hgb A1C   Ref:  Diabetes Care, 22, Suppl. 1, 1999   ESRSEDRATE 40 (H) 05/01/2019     ESRSEDRATE 45 (H) 12/16/2018   ESRSEDRATE 18 06/18/2018   CRP <0.5 06/16/2013   LABURIC 3.9 12/16/2018   REPTSTATUS 05/20/2019 FINAL 05/15/2019   CULT  05/15/2019    NO GROWTH 5 DAYS Performed at [facility] [address], [place], [place] [address]      Lab Results  Component Value Date   ALBUMIN 3.1 (L) 02/10/2020   ALBUMIN 3.8 09/25/2019   ALBUMIN 3.5 05/27/2019   LABURIC 3.9 12/16/2018    Lab Results  Component Value Date   MG 2.2 02/08/2020   MG 2.0 02/06/2020   MG 2.3 09/09/2019   No results found for: VD25OH  No results found for: PREALBUMIN CBC EXTENDED Latest Ref Rng & Units 04/30/2020 02/10/2020 02/09/2020  WBC 4.0 - 10.5 K/uL 4.9 3.6(L) 3.3(L)  RBC 4.22 - 5.81 MIL/uL 3.53(L) 3.04(L) 3.01(L)  HGB 13.0 - 17.0 g/dL 10.8(L) 9.4(L) 9.4(L)  HCT 39 - 52 % 33.4(L) 28.9(L) 29.0(L)  PLT 150 - 400 K/uL 77(L) 87(L) 70(L)  NEUTROABS 1.7 - 7.7 K/uL 3.5 2.5 -  LYMPHSABS 0.7 - 4.0 K/uL 0.9 0.8 -     Body mass index is 28.13 kg/m.  Orders:  No orders of the defined types  were placed in this encounter.  No orders of the defined types were placed in this encounter.    Procedures: No procedures performed  Clinical Data: No additional findings.  ROS:  All other systems negative, except as noted in the HPI. Review of Systems  Objective: Vital Signs: Ht 5\' 8"  (1.727 m)   Wt 185 lb (83.9 kg)   BMI 28.13 kg/m   Specialty Comments:  No specialty comments available.  PMFS History: Patient Active Problem List   Diagnosis Date Noted  . OSA on CPAP 02/11/2020  . Persistent hypersomnia 02/11/2020  . Cognitive decline 02/11/2020  . Hip fracture ([place]) 02/09/2020  . Osteomyelitis of second toe of right foot ([place])   . Closed right hip fracture, initial encounter ([place]) 02/04/2020  . Closed intertrochanteric fracture of right hip, initial encounter ([place]) 02/04/2020  . Closed nondisplaced intertrochanteric fracture of right femur ([place])   . Symptomatic  bradycardia 09/24/2019  . Chronic diastolic heart failure ([place]) 06/03/2019  . Coronary artery disease involving native heart without angina pectoris 06/03/2019  . Debility 05/26/2019  . 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 Toe infection 05/17/2019  . DOE (dyspnea on exertion) 05/15/2019  . Severe tricuspid regurgitation 05/15/2019  . Chronic right-sided CHF (congestive heart failure) ([place]) 05/15/2019  . Cellulitis of fourth toe of left foot   . Excessive daytime sleepiness 02/11/2019  . 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[place]) 02/11/2019  . Iron deficiency anemia 02/04/2019  . Poor memory 12/16/2018  . Deficiency anemia 11/19/2018  . Other fatigue 11/19/2018  . History of elevated PSA 11/19/2018  . Prostate cancer screening 11/19/2018  . Prostate CA ([place]) 11/19/2018  . Pancytopenia, acquired ([place]) 08/13/2018  . Recurrent falls 08/13/2018  . 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 Peripheral neuropathy 07/31/2018  . 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 Anemia, chronic disease 01/28/2018  . History of skin cancer 07/27/2017  . Fatty liver disease, nonalcoholic 45/80/9983  . Thrombocytopenia ([place]) 11/27/2016  . Lung nodule 04/23/2014  . 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 Long term (current) use of anticoagulants 11/22/2011  . Abdominal bruit 03/07/2011  . Pacemaker-St.Jude   . Hx of CABG   . Hyperlipidemia type II   . OSA (obstructive sleep apnea)   . PACEMAKER, PERMANENT 08/17/2010  . HYPERLIPIDEMIA 08/16/2010  . Obesity 08/16/2010  . Essential hypertension 08/16/2010  . Atrial fibrillation ([place]) 08/16/2010  . IRREGULAR HEART RATE 08/16/2010  . Sleep apnea 08/16/2010  . BENIGN PROSTATIC HYPERTROPHY, HX OF 08/16/2010   Past Medical History:  Diagnosis Date  . (HFpEF) heart failure with preserved ejection fraction ([place])    a. 05/2013 Echo: EF 55%, mild LVH, diast dysfxn, Ao  sclerosis, mildly dil LA, RV dysfxn (poorly visualized), PASP 60mmHg;  b. 03/2017 Echo: EF 55-60%, no rwma, triv MR, mildly dil RV, mod TR, PASP 62mmHg.  . Atrial fibrillation [facility])    s/p Cox Maze 1/09;  Multaq Rx d/c'd in 2014 due to pulmo fibrosis;  coumadin d/c'd in 2014 due to spontaneous subdural hematoma  . BPH (benign prostatic hyperplasia)   . CAD (coronary artery disease), native coronary artery    a. s/p CABG 12/2007;  b. Myoview 12/2011: EF 66%, no scar or ischemia; normal.  . DM (diabetes mellitus) ([place])   . Hyperlipidemia type II   . Hypertension   . 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 OSA (obstructive sleep apnea)   . Pacemaker  PPM - St. Jude  . Peripheral neuropathy 07/31/2018  . Pulmonary fibrosis ([place])    Multaq d/c'd 7/14  . Subdural hematoma ([place]) 07/2012   spontaneous;  coumadin d/c'd => no longer a candidate for anticoagulation    Family History  Problem Relation Age of Onset  . Heart disease Father   . Heart attack Father   . Heart failure Father   . Heart disease Mother   . Alzheimer's disease Mother   . Dementia Mother     Past Surgical History:  Procedure Laterality Date  . AMPUTATION Left 05/17/2019   Procedure: AMPUTATION LEFT FOURTH TOE;  Surgeon: Meredith Pel, MD;  Location: [facility];  Service: Orthopedics;  Laterality: Left;  . AMPUTATION TOE Right 02/06/2020   Procedure: AMPUTATION TOE;  Surgeon: Newt Minion, MD;  Location: [facility];  Service: Orthopedics;  Laterality: Right;  . APPENDECTOMY    . CHOLECYSTECTOMY    . CORONARY ARTERY BYPASS GRAFT     x3 (left internal mammary artery to distal left anterior descending coronary artery, saphenous vain graft to second circumflex marginal branch, saphenous vain graft to posterior descending coronary artery, endoscopic saphenous vain harvest from right thigh) and modified Cox - Maze IV procedure.  [name],MD. Electronically signed CHO/MEDQ D: 01/09/2008/ JOB:  [id number] cc:  [name]  . [name]  07/30/2012   Procedure: CRANIOTOMY HEMATOMA EVACUATION SUBDURAL;  Surgeon: [name], MD;  Location: [facility];  Service: Neurosurgery;  Laterality: Right;  Right craniotomy for evacuation of subdural hematoma  . FOOT SURGERY    . HERNIA REPAIR    . INTRAMEDULLARY (IM) NAIL INTERTROCHANTERIC Right 02/04/2020   Procedure: INTRAMEDULLARY (IM) NAIL INTERTROCHANTRIC;  Surgeon: Netta Cedars, MD;  Location: [facility];  Service: Orthopedics;  Laterality: Right;  . ORCHIECTOMY     Left  /  testicular cancer  . PACEMAKER PLACEMENT     PPM - St. Jude  . PPM GENERATOR CHANGEOUT N/A 06/25/2019   Procedure: PPM GENERATOR CHANGEOUT;  Surgeon: Evans Lance, MD;  Location: [facility];  Service: Cardiovascular;  Laterality: N/A;   Social History   Occupational History  . Occupation: Retired- IT trainer  Tobacco Use  . Smoking status: Former Smoker    Packs/day: 2.00    Years: 20.00    Pack years: 40.00    Types: Cigarettes    Quit date: 02/21/1991    Years since quitting: 29.3  . Smokeless tobacco: Never Used  Vaping Use  . Vaping Use: Never used  Substance and Sexual Activity  . Alcohol use: No    Alcohol/week: 0.0 standard drinks  . Drug use: No  . Sexual activity: Not Currently

## 2020-07-01 ENCOUNTER — Telehealth: Payer: Self-pay | Admitting: *Deleted

## 2020-07-01 NOTE — Telephone Encounter (Signed)
Spoke with patient as PPM alert transmission from 07/01/20 suggests increased burden of AT episodes per histograms. 2 HVR episodes show AT 160s-170s, longest recent 31min 37sec on 06/30/20 at 15:08.   Pt denies any symptoms with episodes/elevated HRs. Reports he checks his BP a few times a week and it usually runs 110/70s-80s. Reports compliance with Toprol XL 12.5mg  daily. Pt notes some SOB with exertion (walking up his steep driveway, but not down), but feels it is at baseline. Pt reports a sharp pain at left chest that lasted a few seconds a few days ago, but denies any other recent concerns. Advised pt to call Dr. Jacalyn Lefevre office for any concerns with fluid retention or chest discomfort. Will call back if Dr. Lovena Le has any new recommendations. Pt verbalizes understanding and appreciation of call.

## 2020-07-02 ENCOUNTER — Encounter: Payer: Self-pay | Admitting: *Deleted

## 2020-07-02 NOTE — Telephone Encounter (Signed)
Opened in error

## 2020-07-02 NOTE — Telephone Encounter (Signed)
Additional alert received 07/02/20 for HVR episode on 07/01/20 at 19:15, 2 hr 42 min in duration. EGM shows likely AT (though cannot completely rule out VT as R-R interval does not change with PACs), similar to episode reviewed 07/01/20 by Dr. Lovena Le. Presenting rhythm from 07/02/20 at 02:00 also appears to show 1:1 SVT 160s.  Spoke with pt. He continues to deny any symptoms with episodes and reports compliance with Toprol XL 12.5mg  daily. Pt's wife does state that while pt was asymptomatic with episodes yesterday and overnight, he has reported 2 dizzy spells, most recently last week. This is new for him. Some HVR episodes do correlate with pt doing yard work in the heat. She also reports that he has reported increased fatigue in recent weeks.   Obtained repeat transmission to ensure HR has returned to baseline. Presenting rhythm AS/VS 120s. ED precautions reviewed with pt and wife for new/worsening symptoms over the weekend. Will forward to Dr. Lovena Le for review in light of pt's reported symptoms. Pt and wife in agreement with plan.    Presenting from 07/02/20 at 02:00:   Presenting from 07/02/20 at 12:40:

## 2020-07-02 NOTE — Telephone Encounter (Signed)
Late entry from 07/01/20:  Reviewed transmission data on 07/01/20 with Dr. Lovena Le. Per Dr. Lovena Le, EGMs likely indicate AT, similar to previous. No changes recommended at this time as patient has been asymptomatic with episodes.

## 2020-07-05 ENCOUNTER — Telehealth: Payer: Self-pay

## 2020-07-05 NOTE — Telephone Encounter (Signed)
Merlin alert received 07/05/20 @ 0200 AM for  presenting rhythm AS/VS 120 bpm, 3 new AMS (longest 5 min 54 sec) and one HVR, 1 hour 25 minutes (1:1 atrially driven).  Hx of asymptomatic per pervious notes.  Called patient to assess, no answer. LMOVM.  Routing to Dr. Lovena Le for review.

## 2020-07-06 NOTE — Telephone Encounter (Signed)
SVT. NO change.

## 2020-07-07 ENCOUNTER — Telehealth: Payer: Self-pay

## 2020-07-07 NOTE — Telephone Encounter (Signed)
Attempted to return patient phone call, no answer. LMOVM.

## 2020-07-07 NOTE — Telephone Encounter (Signed)
The pt was returning the nurse call. I told him the nurse will give him a call back. He said the nurse can call him on his cell phone [phone].

## 2020-07-07 NOTE — Telephone Encounter (Signed)
Spoke with pt. Advised of MD recommendation for no changes due to recent episodes of SVT that appear to be occurring when he is sleeping.  Pt v/u to report if any change in condition occurs.

## 2020-07-08 DIAGNOSIS — Z125 Encounter for screening for malignant neoplasm of prostate: Secondary | ICD-10-CM | POA: Diagnosis not present

## 2020-07-08 DIAGNOSIS — E7800 Pure hypercholesterolemia, unspecified: Secondary | ICD-10-CM | POA: Diagnosis not present

## 2020-07-13 DIAGNOSIS — F0280 Dementia in other diseases classified elsewhere without behavioral disturbance: Secondary | ICD-10-CM | POA: Diagnosis not present

## 2020-07-13 DIAGNOSIS — N1831 Chronic kidney disease, stage 3a: Secondary | ICD-10-CM | POA: Diagnosis not present

## 2020-07-13 DIAGNOSIS — T162XXA Foreign body in left ear, initial encounter: Secondary | ICD-10-CM | POA: Diagnosis not present

## 2020-07-13 DIAGNOSIS — E11621 Type 2 diabetes mellitus with foot ulcer: Secondary | ICD-10-CM | POA: Diagnosis not present

## 2020-07-13 DIAGNOSIS — K219 Gastro-esophageal reflux disease without esophagitis: Secondary | ICD-10-CM | POA: Diagnosis not present

## 2020-07-13 DIAGNOSIS — I5042 Chronic combined systolic (congestive) and diastolic (congestive) heart failure: Secondary | ICD-10-CM | POA: Diagnosis not present

## 2020-07-13 DIAGNOSIS — Z0001 Encounter for general adult medical examination with abnormal findings: Secondary | ICD-10-CM | POA: Diagnosis not present

## 2020-07-13 DIAGNOSIS — K7460 Unspecified cirrhosis of liver: Secondary | ICD-10-CM | POA: Diagnosis not present

## 2020-07-13 DIAGNOSIS — G4733 Obstructive sleep apnea (adult) (pediatric): Secondary | ICD-10-CM | POA: Diagnosis not present

## 2020-07-13 DIAGNOSIS — M792 Neuralgia and neuritis, unspecified: Secondary | ICD-10-CM | POA: Diagnosis not present

## 2020-07-13 DIAGNOSIS — F339 Major depressive disorder, recurrent, unspecified: Secondary | ICD-10-CM | POA: Diagnosis not present

## 2020-07-13 DIAGNOSIS — E1121 Type 2 diabetes mellitus with diabetic nephropathy: Secondary | ICD-10-CM | POA: Diagnosis not present

## 2020-07-14 ENCOUNTER — Ambulatory Visit: Payer: Medicare Other | Admitting: Sports Medicine

## 2020-07-19 ENCOUNTER — Ambulatory Visit (INDEPENDENT_AMBULATORY_CARE_PROVIDER_SITE_OTHER): Payer: Medicare Other | Admitting: Podiatry

## 2020-07-19 ENCOUNTER — Other Ambulatory Visit: Payer: Self-pay

## 2020-07-19 ENCOUNTER — Ambulatory Visit
Admission: RE | Admit: 2020-07-19 | Discharge: 2020-07-19 | Disposition: A | Discharge status: Home / Self Care | Payer: Medicare Other | Source: Ambulatory Visit | Attending: Podiatry | Admitting: Podiatry

## 2020-07-19 DIAGNOSIS — L03031 Cellulitis of right toe: Secondary | ICD-10-CM

## 2020-07-19 DIAGNOSIS — M7989 Other specified soft tissue disorders: Secondary | ICD-10-CM | POA: Diagnosis not present

## 2020-07-19 DIAGNOSIS — E11621 Type 2 diabetes mellitus with foot ulcer: Secondary | ICD-10-CM | POA: Diagnosis not present

## 2020-07-19 DIAGNOSIS — I2510 Atherosclerotic heart disease of native coronary artery without angina pectoris: Secondary | ICD-10-CM | POA: Diagnosis not present

## 2020-07-19 DIAGNOSIS — L97501 Non-pressure chronic ulcer of other part of unspecified foot limited to breakdown of skin: Secondary | ICD-10-CM

## 2020-07-19 DIAGNOSIS — L97511 Non-pressure chronic ulcer of other part of right foot limited to breakdown of skin: Secondary | ICD-10-CM

## 2020-07-19 DIAGNOSIS — M19071 Primary osteoarthritis, right ankle and foot: Secondary | ICD-10-CM | POA: Diagnosis not present

## 2020-07-19 DIAGNOSIS — M86171 Other acute osteomyelitis, right ankle and foot: Secondary | ICD-10-CM | POA: Diagnosis not present

## 2020-07-19 MED ORDER — DOXYCYCLINE HYCLATE 100 MG PO TABS: 100.0000 mg | ORAL_TABLET | Freq: Two times a day (BID) | ORAL | 0 refills | Status: DC

## 2020-07-22 ENCOUNTER — Ambulatory Visit: Payer: Medicare Other | Admitting: Orthopedic Surgery

## 2020-07-26 NOTE — Progress Notes (Signed)
Subjective:   Patient ID: [name]., male   DOB: 79 y.o.   MRN: [id number]   HPI 79 year old male presents the office today with his wife for concerns of a wound to the right fourth toe which has been ongoing for greater than 6 months.  Recently they have noticed the toe becoming somewhat red and swollen and having some bloody to clear drainage.  No pus.  Previously had 2 other amputations performed by Dr. [name] and he has been treating the right fourth toe as well apparently.  They have been cleaning the wound daily and applying a dressing   Review of Systems  All other systems reviewed and are negative.  Past Medical History:  Diagnosis Date   (HFpEF) heart failure with preserved ejection fraction ([place])    a. 05/2013 Echo: EF 55%, mild LVH, diast dysfxn, Ao sclerosis, mildly dil LA, RV dysfxn (poorly visualized), PASP 54mmHg;  b. 03/2017 Echo: EF 55-60%, no rwma, triv MR, mildly dil RV, mod TR, PASP 77mmHg.   Atrial fibrillation ([place])    s/p Cox Maze 1/09;  Multaq Rx d/c'd in 2014 due to pulmo fibrosis;  coumadin d/c'd in 2014 due to spontaneous subdural hematoma   BPH (benign prostatic hyperplasia)    CAD (coronary artery disease), native coronary artery    a. s/p CABG 12/2007;  b. Myoview 12/2011: EF 66%, no scar or ischemia; normal.   DM (diabetes mellitus) ([place])    Hyperlipidemia type II    Hypertension    [name] (monoclonal gammopathy of unknown significance) 07/31/2018   IgA   OSA (obstructive sleep apnea)    Pacemaker    PPM - St. Jude   Peripheral neuropathy 07/31/2018   Pulmonary fibrosis ([place])    Multaq d/c'd 7/14   Subdural hematoma ([place]) 07/2012   spontaneous;  coumadin d/c'd => no longer a candidate for anticoagulation    Past Surgical History:  Procedure Laterality Date   AMPUTATION Left 05/17/2019   Procedure: AMPUTATION LEFT FOURTH TOE;  Surgeon: Meredith Pel, MD;  Location: [facility];  Service: Orthopedics;  Laterality: Left;   AMPUTATION TOE  Right 02/06/2020   Procedure: AMPUTATION TOE;  Surgeon: Newt Minion, MD;  Location: [facility];  Service: Orthopedics;  Laterality: Right;   APPENDECTOMY     CHOLECYSTECTOMY     CORONARY ARTERY BYPASS GRAFT     x3 (left internal mammary artery to distal left anterior descending coronary artery, saphenous vain graft to second circumflex marginal branch, saphenous vain graft to posterior descending coronary artery, endoscopic saphenous vain harvest from right thigh) and modified Cox - Maze IV procedure.  [name],MD. Electronically signed CHO/MEDQ D: 01/09/2008/ JOB: [id number] cc:  [name]   CRANIOTOMY  07/30/2012   Procedure: CRANIOTOMY HEMATOMA EVACUATION SUBDURAL;  Surgeon: [name], MD;  Location: [facility];  Service: Neurosurgery;  Laterality: Right;  Right craniotomy for evacuation of subdural hematoma   FOOT SURGERY     HERNIA REPAIR     INTRAMEDULLARY (IM) NAIL INTERTROCHANTERIC Right 02/04/2020   Procedure: INTRAMEDULLARY (IM) NAIL INTERTROCHANTRIC;  Surgeon: Netta Cedars, MD;  Location: [facility];  Service: Orthopedics;  Laterality: Right;   ORCHIECTOMY     Left  /  testicular cancer   PACEMAKER PLACEMENT     PPM - St. Jude   PPM GENERATOR CHANGEOUT N/A 06/25/2019   Procedure: PPM GENERATOR CHANGEOUT;  Surgeon: Evans Lance, MD;  Location: [facility];  Service: Cardiovascular;  Laterality: N/A;     Current Outpatient Medications:    acetaminophen (TYLENOL) 325 MG tablet, Take 1-2 tablets (325-650 mg total) by mouth every 4 (four) hours as needed for mild pain., Disp:  , Rfl:    allopurinol (ZYLOPRIM) 300 MG tablet, Take 1 tablet (300 mg total) by mouth daily., Disp: 30 tablet, Rfl: 0   Baclofen 5 MG TABS, 1 TABLET AS NEEDED TWICE A DAY AS NEEDED FOR MUSCLE SPASM ORALLY 30 DAY(S), Disp: , Rfl:    docusate sodium (COLACE) 100 MG capsule, Take 1 capsule (100 mg total) by mouth 2 (two) times daily., Disp: 10 capsule, Rfl: 0   donepezil (ARICEPT) 10  MG tablet, Take 10 mg by mouth at bedtime., Disp: , Rfl:    doxycycline (VIBRA-TABS) 100 MG tablet, Take 1 tablet (100 mg total) by mouth 2 (two) times daily., Disp: 20 tablet, Rfl: 0   doxycycline (VIBRAMYCIN) 100 MG capsule, Take 100 mg by mouth 2 (two) times daily., Disp: , Rfl:    ferrous sulfate 325 (65 FE) MG tablet, Take 1 tablet (325 mg total) by mouth daily with breakfast., Disp: 30 tablet, Rfl: 3   gabapentin (NEURONTIN) 100 MG capsule, Take 100 mg by mouth 3 (three) times daily., Disp: , Rfl:    HYDROcodone-acetaminophen (NORCO/VICODIN) 5-325 MG tablet, Take 1-2 tablets by mouth every 4 (four) hours as needed for moderate pain., Disp: 30 tablet, Rfl: 0   methocarbamol (ROBAXIN) 500 MG tablet, Take 1 tablet (500 mg total) by mouth every 6 (six) hours as needed for muscle spasms., Disp: 60 tablet, Rfl: 0   metoprolol succinate (TOPROL-XL) 25 MG 24 hr tablet, Take 0.5 tablets (12.5 mg total) by mouth daily., Disp: 30 tablet, Rfl: 0   mirabegron ER (MYRBETRIQ) 50 MG TB24 tablet, Take 1 tablet (50 mg total) by mouth daily., Disp: 30 tablet, Rfl: 3   modafinil (PROVIGIL) 100 MG tablet, Take 0.5 tablets (50 mg total) by mouth daily., Disp: 30 tablet, Rfl: 2   Multiple Vitamin (MULTIVITAMIN WITH MINERALS) TABS tablet, Take 1 tablet by mouth daily., Disp:  , Rfl:    pantoprazole (PROTONIX) 40 MG tablet, Take 1 tablet (40 mg total) by mouth daily., Disp: 30 tablet, Rfl: 0   polyethylene glycol (MIRALAX / GLYCOLAX) 17 g packet, Take 17 g by mouth daily as needed for mild constipation., Disp: 14 each, Rfl: 0   rosuvastatin (CRESTOR) 40 MG tablet, Take 1 tablet (40 mg total) by mouth daily., Disp: 90 tablet, Rfl: 2   sertraline (ZOLOFT) 100 MG tablet, Take 2 tablets (200 mg total) by mouth daily., Disp: 30 tablet, Rfl: 0   tamsulosin (FLOMAX) 0.4 MG CAPS capsule, Take 1 capsule (0.4 mg total) by mouth at bedtime., Disp: 30 capsule, Rfl: 1   torsemide (DEMADEX) 20 MG tablet, Take 2  tablets in the morning and 1 tablet in the afternoon Take additional dose as needed for leg swellings/shortness of breath /weight gain, Disp: , Rfl:   Allergies  Allergen Reactions   Aricept [Donepezil] Other (See Comments)    Hallucination    Lipitor [Atorvastatin] Other (See Comments)    Stiff joints   Nsaids Other (See Comments)    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   Warfarin And Related Other (See Comments)    Stiff joints   Enbrel [Etanercept] Itching         Objective:  Physical Exam  General:NAD  Dermatological: At the distal aspect of the right fourth toe is a granular wound  with hyperkeratotic periwound.  The wound measures approximately 0.6 x 0.6 x 0.2 cm.  There is no probing to bone, undermining or tunneling.  There is localized edema and erythema of the fourth toe.  There is no ascending cellulitis or warmth of the foot.  No other open lesions identified at this time.  Vascular: Dorsalis Pedis artery and Posterior Tibial artery pedal pulses are 2/4 bilateral with immedate capillary fill time.  There is no pain with calf compression, swelling, warmth, erythema.     Neruologic: Sensation decreased with Semmes Weinstein monofilament  Musculoskeletal: Hammertoe contractures present muscular strength 5/5 in all groups tested bilateral.  Gait: Unassisted, Nonantalgic.       Assessment:   Ulceration right fourth toe with cellulitis     Plan:  -Treatment options discussed including all alternatives, risks, and complications -Etiology of symptoms were discussed -X-rays ordered today for [facility] imaging as well as not able to perform in the office today. -Sharply debrided the wound today utilizing the 312 with scalpel without any complications down to healthy, bleeding, viable tissue. -Daily dressing changes with Medihoney -Doxycycline prescribed -Continue offloading. -Discussed the possible flexor tenotomy in order to help take pressure of the distal toe if not  healing however unfortunately if osteomyelitis may need amputation.  Trula Slade DPM

## 2020-07-28 ENCOUNTER — Telehealth: Payer: Self-pay | Admitting: Emergency Medicine

## 2020-07-28 NOTE — Telephone Encounter (Signed)
Received alert for episodes of SVT and AT in the 170's. Patient has had previous episodes . Spoke with patient and his wife , reports of episodes of dizziness and falls that are difficult to correlate with Tachy episodes. Wife reports possible missed doses of Toprol XL. Education done on importance that Toprol XL 12.5 mg daily doses are taken. Due to see Dr Lovena Le in October , will schedule with EP AP ASAP to evaluate patient.

## 2020-07-29 ENCOUNTER — Ambulatory Visit (INDEPENDENT_AMBULATORY_CARE_PROVIDER_SITE_OTHER): Payer: Medicare Other | Admitting: Podiatry

## 2020-07-29 ENCOUNTER — Other Ambulatory Visit: Payer: Self-pay

## 2020-07-29 DIAGNOSIS — M869 Osteomyelitis, unspecified: Secondary | ICD-10-CM

## 2020-07-29 DIAGNOSIS — I2510 Atherosclerotic heart disease of native coronary artery without angina pectoris: Secondary | ICD-10-CM | POA: Diagnosis not present

## 2020-07-29 DIAGNOSIS — L03031 Cellulitis of right toe: Secondary | ICD-10-CM | POA: Diagnosis not present

## 2020-08-01 LAB — WOUND CULTURE
MICRO NUMBER:: 10819898
SPECIMEN QUALITY:: ADEQUATE

## 2020-08-01 NOTE — Progress Notes (Addendum)
Subjective: 79 year old male presents the office today with his wife for follow-up evaluation of wound to the right fourth toe.  They have been continue with Medihoney dressing changes daily and he is still on doxycycline.  He has noticed some improvement but still some swelling around the distal aspect of the toe.  No red streaks.  They have not seen any drainage or pus. Denies any systemic complaints such as fevers, chills, nausea, vomiting. No acute changes since last appointment, and no other complaints at this time.   Objective: AAO x3, NAD DP/PT pulses palpable bilaterally, CRT less than 3 seconds Hammertoe contractures present resulting in a hyperkeratotic lesion with granular wound at the distal aspect of right fourth toe.  He does appear to be small but there is still localized edema and erythema to the distal aspect the toe.  There is no probing to bone, undermining or tunneling and there is no drainage or pus identified otherwise. No pain with calf compression, swelling, warmth, erythema  Assessment: Ulceration right fourth toe with concern for possible osteomyelitis  Plan: -All treatment options discussed with the patient including all alternatives, risks, complications.  -The lesion was sharply debrided today the distal aspect of toe without any complications he was in the 312 with scalpel down to healthy, bleeding, viable tissue.  Continue with daily dressing changes.  We will continue the course of doxycycline.  Some mild improvement but still concerned about the localized edema erythema at the distal portion of the toe.  Also previous x-rays were concerning for possible osteomyelitis.  I will see him back next week and we will repeat x-rays of the right foot.  Monitor closely signs or symptoms of worsening infection go to the ER should any occur. -Wound culture obtained today. -Patient encouraged to call the office with any questions, concerns, change in symptoms.   Trula Slade DPM

## 2020-08-02 ENCOUNTER — Other Ambulatory Visit: Payer: Self-pay | Admitting: Podiatry

## 2020-08-02 MED ORDER — CIPROFLOXACIN HCL 500 MG PO TABS: 500.0000 mg | ORAL_TABLET | Freq: Two times a day (BID) | ORAL | 0 refills | Status: DC

## 2020-08-03 NOTE — Progress Notes (Signed)
HPI: FU CAD and atrial fibrillation. He underwent CABG (LIMA-LAD, SVG-OM 2, SVG-PDA) along with modified Cox Maze IV procedure in 12/2007. 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 Abdominal US 3/12: No aneurysm. 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 He underwent craniotomy and evacuation by Dr. Saintclair Halsted. He was taken off of Coumadin and no longer felt to be an anticoagulation candidate.Multaq DCed previously as felt causing pulmonary toxixity. Nuclear study 5/18 showed EF 59 with normal perfusion. EchocardiogramMay 2020 showed ejection fraction 50 to 55%, moderate right ventricular enlargement, mildly reduced RV function, severe tricuspid regurgitation. ABIs June 2020 normal. Carotid Dopplers June 2020 showed 1 to 39% bilateral stenosis. Transcranial Dopplers June 2020 negative.Chest CT August 2020 showed mild to moderate pulmonary fibrosis. Pulmonary function test November 2020 normal. Patient fell February 2021 and fractured his hip.  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  Since last seen,he has dyspnea with moderate activities but not routine activities.  No orthopnea, PND, chest pain or syncope.  He does have chronic mild pedal edema.  Current Outpatient Medications  Medication Sig Dispense Refill  . acetaminophen (TYLENOL) 325 MG tablet Take 1-2 tablets (325-650 mg total) by mouth every 4 (four) hours as needed for mild pain.    [name] allopurinol (ZYLOPRIM) 300 MG tablet Take 1 tablet (300 mg total) by mouth daily. 30 tablet 0  . Baclofen 5 MG TABS 1 TABLET AS NEEDED TWICE A DAY AS NEEDED FOR MUSCLE SPASM ORALLY 30 DAY(S)    . ciprofloxacin (CIPRO) 500 MG tablet Take 1 tablet (500 mg total) by mouth 2 (two) times daily. 20 tablet 0  . docusate sodium (COLACE) 100 MG capsule Take 1 capsule (100 mg total) by mouth 2 (two) times daily. 10 capsule 0  . ferrous sulfate  325 (65 FE) MG tablet Take 1 tablet (325 mg total) by mouth daily with breakfast. 30 tablet 3  . gabapentin (NEURONTIN) 100 MG capsule Take 100 mg by mouth 3 (three) times daily.    . methocarbamol (ROBAXIN) 500 MG tablet Take 1 tablet (500 mg total) by mouth every 6 (six) hours as needed for muscle spasms. 60 tablet 0  . metoprolol succinate (TOPROL-XL) 25 MG 24 hr tablet Take 0.5 tablets (12.5 mg total) by mouth daily. 30 tablet 0  . mirabegron ER (MYRBETRIQ) 50 MG TB24 tablet Take 1 tablet (50 mg total) by mouth daily. 30 tablet 3  . modafinil (PROVIGIL) 100 MG tablet Take 0.5 tablets (50 mg total) by mouth daily. 30 tablet 2  . Multiple Vitamin (MULTIVITAMIN WITH MINERALS) TABS tablet Take 1 tablet by mouth daily.    . pantoprazole (PROTONIX) 40 MG tablet Take 1 tablet (40 mg total) by mouth daily. 30 tablet 0  . polyethylene glycol (MIRALAX / GLYCOLAX) 17 g packet Take 17 g by mouth daily as needed for mild constipation. 14 each 0  . rosuvastatin (CRESTOR) 40 MG tablet Take 1 tablet (40 mg total) by mouth daily. 90 tablet 2  . sertraline (ZOLOFT) 100 MG tablet Take 2 tablets (200 mg total) by mouth daily. 30 tablet 0  . tamsulosin (FLOMAX) 0.4 MG CAPS capsule Take 1 capsule (0.4 mg total) by mouth at bedtime. 30 capsule 1  . torsemide (DEMADEX) 20 MG tablet Take 2 tablets in the morning and 1 tablet in the afternoon Take additional dose as needed for leg swellings/shortness of breath /weight gain  No current facility-administered medications for this visit.     Past Medical History:  Diagnosis Date  . (HFpEF) heart failure with preserved ejection fraction ([place])    a. 05/2013 Echo: EF 55%, mild LVH, diast dysfxn, Ao sclerosis, mildly dil LA, RV dysfxn (poorly visualized), PASP 25mmHg;  b. 03/2017 Echo: EF 55-60%, no rwma, triv MR, mildly dil RV, mod TR, PASP 60mmHg.  . Atrial fibrillation [facility])    s/p Cox Maze 1/09;  Multaq Rx d/c'd in 2014 due to pulmo fibrosis;  coumadin d/c'd in 2014  due to spontaneous subdural hematoma  . BPH (benign prostatic hyperplasia)   . CAD (coronary artery disease), native coronary artery    a. s/p CABG 12/2007;  b. Myoview 12/2011: EF 66%, no scar or ischemia; normal.  . DM (diabetes mellitus) ([place])   . Hyperlipidemia type II   . Hypertension   . 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 OSA (obstructive sleep apnea)   . Pacemaker    PPM - St. Jude  . Peripheral neuropathy 07/31/2018  . Pulmonary fibrosis ([place])    Multaq d/c'd 7/14  . Subdural hematoma ([place]) 07/2012   spontaneous;  coumadin d/c'd => no longer a candidate for anticoagulation    Past Surgical History:  Procedure Laterality Date  . AMPUTATION Left 05/17/2019   Procedure: AMPUTATION LEFT FOURTH TOE;  Surgeon: Meredith Pel, MD;  Location: [facility];  Service: Orthopedics;  Laterality: Left;  . AMPUTATION TOE Right 02/06/2020   Procedure: AMPUTATION TOE;  Surgeon: Newt Minion, MD;  Location: [place];  Service: Orthopedics;  Laterality: Right;  . APPENDECTOMY    . CHOLECYSTECTOMY    . CORONARY ARTERY BYPASS GRAFT     x3 (left internal mammary artery to distal left anterior descending coronary artery, saphenous vain graft to second circumflex marginal branch, saphenous vain graft to posterior descending coronary artery, endoscopic saphenous vain harvest from right thigh) and modified Cox - Maze IV procedure.  [name],MD. Electronically signed CHO/MEDQ D: 01/09/2008/ JOB: [id number] cc:  [name]  . [name]  07/30/2012   Procedure: CRANIOTOMY HEMATOMA EVACUATION SUBDURAL;  Surgeon: [name], MD;  Location: [place] [facility];  Service: Neurosurgery;  Laterality: Right;  Right craniotomy for evacuation of subdural hematoma  . FOOT SURGERY    . HERNIA REPAIR    . INTRAMEDULLARY (IM) NAIL INTERTROCHANTERIC Right 02/04/2020   Procedure: INTRAMEDULLARY (IM) NAIL INTERTROCHANTRIC;  Surgeon: Netta Cedars, MD;  Location: [facility];  Service:  Orthopedics;  Laterality: Right;  . ORCHIECTOMY     Left  /  testicular cancer  . PACEMAKER PLACEMENT     PPM - St. Jude  . PPM GENERATOR CHANGEOUT N/A 06/25/2019   Procedure: PPM GENERATOR CHANGEOUT;  Surgeon: Evans Lance, MD;  Location: [place] [facility];  Service: Cardiovascular;  Laterality: N/A;    Social History   Socioeconomic History  . Marital status: Married    Spouse name: [name]  . Number of children: 2  . Years of education: Not on file  . Highest education level: Not on file  Occupational History  . Occupation: Retired- IT trainer  Tobacco Use  . Smoking status: Former Smoker    Packs/day: 2.00    Years: 20.00    Pack years: 40.00    Types: Cigarettes    Quit date: 02/21/1991    Years since quitting: 29.4  . Smokeless tobacco: Never Used  Vaping Use  .  Vaping Use: Never used  Substance and Sexual Activity  . Alcohol use: No    Alcohol/week: 0.0 standard drinks  . Drug use: No  . Sexual activity: Not Currently  Other Topics Concern  . Not on file  Social History Narrative   Lives with wife   Right handed    Married with two children.     He is a Engineer, structural.     Social Determinants of Health   Financial Resource Strain:   . Difficulty of Paying Living Expenses: Not on file  Food Insecurity:   . Worried About Charity fundraiser in the Last Year: Not on file  . Ran Out of Food in the Last Year: Not on file  Transportation Needs:   . Lack of Transportation (Medical): Not on file  . Lack of Transportation (Non-Medical): Not on file  Physical Activity:   . Days of Exercise per Week: Not on file  . Minutes of Exercise per Session: Not on file  Stress:   . Feeling of Stress : Not on file  Social Connections:   . Frequency of Communication with Friends and Family: Not on file  . Frequency of Social Gatherings with Friends and Family: Not on file  . Attends Religious Services: Not on file  . Active Member of Clubs or Organizations: Not on file    . Attends Archivist Meetings: Not on file  . Marital Status: Not on file  Intimate Partner Violence:   . Fear of Current or Ex-Partner: Not on file  . Emotionally Abused: Not on file  . Physically Abused: Not on file  . Sexually Abused: Not on file    Family History  Problem Relation Age of Onset  . Heart disease Father   . Heart attack Father   . Heart failure Father   . Heart disease Mother   . Alzheimer's disease Mother   . Dementia Mother     ROS: no fevers or chills, productive cough, hemoptysis, dysphasia, odynophagia, melena, hematochezia, dysuria, hematuria, rash, seizure activity, orthopnea, PND, claudication. Remaining systems are negative.  Physical Exam: Well-developed well-nourished in no acute distress.  Skin is warm and dry.  HEENT is normal.  Neck is supple.  Chest is clear to auscultation with normal expansion.  Cardiovascular exam is regular rate and rhythm.  Abdominal exam nontender or distended. No masses palpated. Extremities show 1+ edema. neuro grossly intact  A/P  1 chronic right-sided CHF-volume status is reasonably well controlled.  Continue Demadex at present dose.  Check potassium and renal function.  Note right-sided CHF is felt secondary to a combination of pulmonary venous hypertension, pulmonary fibrosis and sleep apnea.  2 paroxysmal atrial fibrillation-patient is in sinus today on exam.  Continue beta-blocker.  He is not on anticoagulation given history of spontaneous subdural hematoma.  3 ectopic atrial tachycardia-continue beta-blocker.  No palpitations.  4 severe tricuspid regurgitation-this is felt secondary to pulmonary hypertension with right ventricular enlargement versus trauma from prior pacemaker.  He is not a surgical candidate as outlined in previous notes due to multiple comorbidities.  5 coronary artery disease-continue statin.  He is not on aspirin given history of intracranial hemorrhage.  6 pacemaker-followed  by electrophysiology.  7 hyperlipidemia-continue statin.  8 cirrhosis/pulmonary fibrosis/chronic anemia/thrombocytopenia-management per primary care.  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  Kirk Ruths, MD

## 2020-08-04 ENCOUNTER — Other Ambulatory Visit: Payer: Self-pay

## 2020-08-04 ENCOUNTER — Ambulatory Visit (INDEPENDENT_AMBULATORY_CARE_PROVIDER_SITE_OTHER): Payer: Medicare Other | Admitting: Physician Assistant

## 2020-08-04 VITALS — BP 100/58 | HR 100 | Ht 68.0 in | Wt 188.0 lb

## 2020-08-04 DIAGNOSIS — I5032 Chronic diastolic (congestive) heart failure: Secondary | ICD-10-CM

## 2020-08-04 DIAGNOSIS — I480 Paroxysmal atrial fibrillation: Secondary | ICD-10-CM | POA: Diagnosis not present

## 2020-08-04 DIAGNOSIS — I471 Supraventricular tachycardia: Secondary | ICD-10-CM | POA: Diagnosis not present

## 2020-08-04 DIAGNOSIS — I2510 Atherosclerotic heart disease of native coronary artery without angina pectoris: Secondary | ICD-10-CM | POA: Diagnosis not present

## 2020-08-04 NOTE — Progress Notes (Signed)
Cardiology Office Note Date:  08/04/2020  Patient ID:  Jamie Warshawsky., DOB 09-14-1941, MRN [id number] PCP:  [name], MD  Cardiologist:  Dr. [name] EP: Dr. Lovena Le    Chief Complaint:  SVT  History of Present Illness: Jamie Jakobe Blau. is a 79 y.o. 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 Lovena Le, last seen by him Oct 2020.  Discussed his SVT was asymptomatic, no changes were made.  Most recently saw Dr. [name] Feb 2021.  He was feeling well, edema described as stable, chronic R sided CHF.  His right-sided congestive heart failure is felt secondary to a combination of pulmonary venous hypertension, pulmonary fibrosis and sleep apnea.  Discussed balance of euvolemic state and avoiding hypotension has been problematic.  Device clinic noted episodes of SVT on a remote, seems associated with dizziness, and asked to come in for evaluation.  TODAY He is accompanied by his wife. They both think he is doing OK He denies any CP, though his wife states that he has mentioned CP.  In further discussion this is rare, random, and fleeting/momentary sharp pains. No associated symptoms No exertional CP, or anginal sounding symptoms He denies rest SOB, no symptoms of PND or orthopnea, wears CPAP every night No palpitations  The device clinic note mentions dizziness, but he denies this.  He does not sleep well, have very vivid dreams, his wife says even hallucinations it seems, they have had conversations with his PMD about this, it is not new, and unknown cause.  He has a neurologist, Dr . Jannifer Franklin as well and reports they  have discussed this there as well.   AFib Hx Diagnosis goes back as far as his chart  Cox MAZE 2009 Spontaneous SDH 2014 > coumadin stopped and not felt a/c candidate AAD Multaq stopped 2014 w/concerns of pulm toxicity  Device information SJM dual chamber PPM implanted 2009, gen change 06/27/2019  Past Medical History:  Diagnosis Date  . (HFpEF) heart failure with preserved ejection fraction ([place])    a. 05/2013 Echo: EF 55%, mild LVH, diast dysfxn, Ao sclerosis, mildly dil LA, RV dysfxn (poorly visualized), PASP 45mmHg;  b. 03/2017 Echo: EF 55-60%, no rwma, triv MR, mildly dil RV, mod TR, PASP 64mmHg.  . Atrial fibrillation [facility])    s/p Cox Maze 1/09;  Multaq Rx d/c'd in 2014 due to pulmo fibrosis;  coumadin d/c'd in 2014 due to spontaneous subdural hematoma  . BPH (benign prostatic hyperplasia)   . CAD (coronary artery disease), native coronary artery    a. s/p CABG 12/2007;  b. Myoview 12/2011: EF 66%, no scar or ischemia; normal.  . DM (diabetes mellitus) ([place])   . Hyperlipidemia type II   . Hypertension   . 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 OSA (obstructive sleep apnea)   . Pacemaker    PPM - St. Jude  . Peripheral neuropathy 07/31/2018  . Pulmonary fibrosis ([place])    Multaq d/c'd 7/14  . Subdural hematoma ([place]) 07/2012   spontaneous;  coumadin d/c'd => no longer a candidate for anticoagulation  Past Surgical History:  Procedure Laterality Date  . AMPUTATION Left 05/17/2019   Procedure: AMPUTATION LEFT FOURTH TOE;  Surgeon: Meredith Pel, MD;  Location: [place];  Service: Orthopedics;  Laterality: Left;  . AMPUTATION TOE Right 02/06/2020   Procedure: AMPUTATION TOE;  Surgeon: Newt Minion, MD;  Location: [place];  Service: Orthopedics;  Laterality: Right;  . APPENDECTOMY    . CHOLECYSTECTOMY    . CORONARY ARTERY BYPASS GRAFT     x3 (left internal mammary artery to distal left anterior descending coronary artery, saphenous vain graft  to second circumflex marginal branch, saphenous vain graft to posterior descending coronary artery, endoscopic saphenous vain harvest from right thigh) and modified Cox - Maze IV procedure.  [name],MD. Electronically signed CHO/MEDQ D: 01/09/2008/ JOB: [id number] cc:  [name]  . [name]  07/30/2012   Procedure: CRANIOTOMY HEMATOMA EVACUATION SUBDURAL;  Surgeon: [name], MD;  Location: [place] [facility];  Service: Neurosurgery;  Laterality: Right;  Right craniotomy for evacuation of subdural hematoma  . FOOT SURGERY    . HERNIA REPAIR    . INTRAMEDULLARY (IM) NAIL INTERTROCHANTERIC Right 02/04/2020   Procedure: INTRAMEDULLARY (IM) NAIL INTERTROCHANTRIC;  Surgeon: Netta Cedars, MD;  Location: [place];  Service: Orthopedics;  Laterality: Right;  . ORCHIECTOMY     Left  /  testicular cancer  . PACEMAKER PLACEMENT     PPM - St. Jude  . PPM GENERATOR CHANGEOUT N/A 06/25/2019   Procedure: PPM GENERATOR CHANGEOUT;  Surgeon: Evans Lance, MD;  Location: [place] CV [facility];  Service: Cardiovascular;  Laterality: N/A;    Current Outpatient Medications  Medication Sig Dispense Refill  . acetaminophen (TYLENOL) 325 MG tablet Take 1-2 tablets (325-650 mg total) by mouth every 4 (four) hours as needed for mild pain.    [name] allopurinol (ZYLOPRIM) 300 MG tablet Take 1 tablet (300 mg total) by mouth daily. 30 tablet 0  . Baclofen 5 MG TABS 1 TABLET AS NEEDED TWICE A DAY AS NEEDED FOR MUSCLE SPASM ORALLY 30 DAY(S)    . ciprofloxacin (CIPRO) 500 MG tablet Take 1 tablet (500 mg total) by mouth 2 (two) times daily. 20 tablet 0  . docusate sodium (COLACE) 100 MG capsule Take 1 capsule (100 mg total) by mouth 2 (two) times daily. 10 capsule 0  . donepezil (ARICEPT) 10 MG tablet Take 10 mg by mouth at bedtime.    [name] doxycycline (VIBRA-TABS) 100 MG tablet Take 1 tablet (100 mg total) by mouth 2 (two) times daily. 20 tablet 0  . ferrous sulfate 325 (65 FE) MG tablet Take 1 tablet (325 mg total) by mouth  daily with breakfast. 30 tablet 3  . gabapentin (NEURONTIN) 100 MG capsule Take 100 mg by mouth 3 (three) times daily.    . methocarbamol (ROBAXIN) 500 MG tablet Take 1 tablet (500 mg total) by mouth every 6 (six) hours as needed for muscle spasms. 60 tablet 0  . metoprolol succinate (TOPROL-XL) 25 MG 24 hr tablet Take 0.5 tablets (12.5 mg total) by mouth daily. 30 tablet 0  . mirabegron ER (MYRBETRIQ) 50 MG TB24 tablet Take 1 tablet (50 mg total) by mouth daily. 30 tablet 3  . modafinil (PROVIGIL) 100 MG tablet Take 0.5 tablets (50 mg total) by mouth daily. 30 tablet 2  . Multiple Vitamin (MULTIVITAMIN WITH MINERALS) TABS tablet Take 1 tablet by mouth daily.    . pantoprazole (PROTONIX) 40 MG tablet Take 1 tablet (40 mg total)  by mouth daily. 30 tablet 0  . polyethylene glycol (MIRALAX / GLYCOLAX) 17 g packet Take 17 g by mouth daily as needed for mild constipation. 14 each 0  . rosuvastatin (CRESTOR) 40 MG tablet Take 1 tablet (40 mg total) by mouth daily. 90 tablet 2  . sertraline (ZOLOFT) 100 MG tablet Take 2 tablets (200 mg total) by mouth daily. 30 tablet 0  . tamsulosin (FLOMAX) 0.4 MG CAPS capsule Take 1 capsule (0.4 mg total) by mouth at bedtime. 30 capsule 1  . torsemide (DEMADEX) 20 MG tablet Take 2 tablets in the morning and 1 tablet in the afternoon Take additional dose as needed for leg swellings/shortness of breath /weight gain     No current facility-administered medications for this visit.    Allergies:   Aricept [donepezil], Lipitor [atorvastatin], Nsaids, Warfarin and related, and Enbrel [etanercept]   Social History:  The patient  reports that he quit smoking about 29 years ago. His smoking use included cigarettes. He has a 40.00 pack-year smoking history. He has never used smokeless tobacco. He reports that he does not drink alcohol and does not use drugs.   Family History:  The patient's family history includes Alzheimer's disease in his mother; Dementia in his mother;  Heart attack in his father; Heart disease in his father and mother; Heart failure in his father.  ROS:  Please see the history of present illness. All other systems are reviewed and otherwise negative.   PHYSICAL EXAM:  VS:  BP (!) 100/58   Pulse 100   Ht 5\' 8"  (1.727 m)   Wt 188 lb (85.3 kg)   SpO2 94%   BMI 28.59 kg/m  BMI: Body mass index is 28.59 kg/m. Well nourished, well developed, in no acute distress  HEENT: normocephalic, atraumatic  Neck: no JVD, carotid bruits or masses Cardiac:  RRR; no significant murmurs, no rubs, or gallops Lungs:  CTA b/l, no wheezing, rhonchi or rales  Abd: soft, nontender MS: no deformity or atrophy Ext: trace edema, chronic dark skin changes.  Skin: warm and dry, no rash Neuro:  No gross deficits appreciated Psych: euthymic mood, full affect  PPM site is stable, no tethering or discomfort   EKG:  HR 98-99bpm, RBBB.  No discernible P waves, though on the programmer he is clearly in [name]  PPM interrogation done today and reviewed by myself: Battery and lead measurements are good He has had AMS episodes, and HVR Burden <1% Most are very brief 1:1 AT He had one true Afib episode back in Nov 2020 5hrs 30min  05/17/2019: TTE IMPRESSIONS  1. The left ventricle has low normal systolic function, with an ejection  fraction of 50-55%. The cavity size was normal. Left ventricular diastolic  Doppler parameters are indeterminate.  2. LVEF is approximately 50 to 55% with septal hypokinesis. Poor.  3. The right ventricle was not well visualized. The cavity was moderately  enlarged. There is no increase in right ventricular wall thickness.  4. RV is difficult to visualize, even with Definity It appears dilated  and function is at least mildly reduced.  5. The mitral valve is abnormal. Mild thickening of the mitral valve  leaflet.  6. The tricuspid valve is grossly normal. Tricuspid valve regurgitation  is severe.  7. The aortic valve was not  well visualized. Mild thickening of the  aortic valve. Mild calcification of the aortic valve.  8. The inferior vena cava was dilated in size with <50% respiratory  variability.  9. The interatrial septum was not assessed.    04/24/2017: stress myoview  Nuclear stress EF: 59%. The left ventricular ejection fraction is normal (55-65%).  The study is normal. No evidence of ischemia .  This is a low risk study.    Recent Labs: 02/08/2020: Magnesium 2.2 02/10/2020: ALT 10 04/21/2020: BUN 25; Creatinine, Ser 1.55; Potassium 4.2; Sodium 141 04/30/2020: Hemoglobin 10.8; Platelets 77  No results found for requested labs within last 8760 hours.   CrCl cannot be calculated (Patient's most recent lab result is older than the maximum 21 days allowed.).   Wt Readings from Last 3 Encounters:  08/04/20 188 lb (85.3 kg)  06/24/20 185 lb (83.9 kg)  05/07/20 185 lb (83.9 kg)     Other studies reviewed: Additional studies/records reviewed today include: summarized above  ASSESSMENT AND PLAN:  1. PPM     site looks good      Discussed importance of wound management with his recurrent toe wounds  2. Paroxysmal Afib     Not an a/c candidate with h/o spontanwous SHD     Last true episode  Was in Nov 202, low burden  3. SVT/ATach     Known for him     Mostly ar fairly brief.  They seem to occur every couple months     He had a long episode in July of 6 hours was nocturnal  Long discussion today His resting HR 90's Very low dose metoprolol, but not much BP to work with For now, I have made no changes given his is asymptomatic,should his burden increase, we may need to revisit  His EKG has no clear P waves though is clearly in SR on the programmed.  Likely poor P amplitude given h/o MAZE  4. CAD     No anginal sounding symptoms     On BB, statin     C/w Dr. [name]  5. Chronic CHF     R sided    HFpEF    No overt findings of volume OL, he has some trace/mild edema, no SOB    On  toresmide  6. HTN     With relative hypotension now    Disposition: F/u with Dr. [name] as scheduled, remotes Q 51mo, and EP in clinic in 1 year, sooner if needed   Current medicines are reviewed at length with the patient today.  The patient did not have any concerns regarding medicines.  Venetia Night, PA-C 08/04/2020 4:18 PM     [address] [place] [id number] [phone] (office)  [phone] (fax)

## 2020-08-04 NOTE — Patient Instructions (Signed)

## 2020-08-05 ENCOUNTER — Ambulatory Visit (INDEPENDENT_AMBULATORY_CARE_PROVIDER_SITE_OTHER): Payer: Medicare Other

## 2020-08-05 ENCOUNTER — Encounter: Payer: Self-pay | Admitting: Adult Health

## 2020-08-05 ENCOUNTER — Ambulatory Visit (INDEPENDENT_AMBULATORY_CARE_PROVIDER_SITE_OTHER): Payer: Medicare Other | Admitting: Adult Health

## 2020-08-05 VITALS — BP 110/60 | HR 99 | Temp 97.6°F | Ht 68.0 in | Wt 189.4 lb

## 2020-08-05 DIAGNOSIS — G4733 Obstructive sleep apnea (adult) (pediatric): Secondary | ICD-10-CM

## 2020-08-05 DIAGNOSIS — I5032 Chronic diastolic (congestive) heart failure: Secondary | ICD-10-CM

## 2020-08-05 DIAGNOSIS — I2510 Atherosclerotic heart disease of native coronary artery without angina pectoris: Secondary | ICD-10-CM | POA: Diagnosis not present

## 2020-08-05 DIAGNOSIS — Z9989 Dependence on other enabling machines and devices: Secondary | ICD-10-CM | POA: Diagnosis not present

## 2020-08-05 DIAGNOSIS — J84112 Idiopathic pulmonary fibrosis: Secondary | ICD-10-CM

## 2020-08-05 DIAGNOSIS — J8410 Pulmonary fibrosis, unspecified: Secondary | ICD-10-CM

## 2020-08-05 DIAGNOSIS — I517 Cardiomegaly: Secondary | ICD-10-CM | POA: Diagnosis not present

## 2020-08-05 NOTE — Addendum Note (Signed)
Addended by: Claude Manges on: 08/05/2020 01:22 PM   Modules accepted: Orders

## 2020-08-05 NOTE — Assessment & Plan Note (Signed)
Appears compensated without any evidence of volume overload on exam.  Continue follow-up with primary care.

## 2020-08-05 NOTE — Progress Notes (Signed)
@Patient  ID: [name]., male    DOB: [birth year], 79 y.o.   MRN: [id number]  Chief Complaint  Patient presents with   Follow-up    IPF     Referring provider: [name], MD  HPI: 79 year old male former smoker followed for Pulmonary Fibrosis  Medical history significant for congestive heart failure, A. fib, coronary artery disease status post CABG, [name], dementia, Neuropathy, OSA and daytime sleepiness managed by Neurology   TEST/EVENTS :  HRCT chest August 11, 2019 mild to moderate pulmonary fibrosis with a apical to basal gradient features mild traction bronchiectasis and subpleural irregular interstitial opacity with areas of bronchiectasis at the lung bases without evidence of honeycombing.  Findings are slightly worsened over time dating back to June 12, 2013  PFTs October 28, 2019 showed no airflow obstruction or restriction with an FEV1 at 97%, ratio 85, FVC 82%, no significant bronchodilator response, moderate diffusing defect with DLCO at 68% (lung function stable with improved DLCO compared to PFTs in 2014 DLCO 50%).  PFTs 06/11/13:  FeV1 90% FeV1/FVC 74% DLCO 50% TLC 63%   Autoimmune w/up 2014 - Neg RA , ANA , CCP   IPF :  RF: Firefighter, police office , Curator, former smoker  No known autoimmune/CTD     08/05/2020 Follow up: Pulmonary Fibrosis  Patient returns for a follow-up visit.  Last seen November 2020.  Patient has underlying pulmonary fibrosis.  Abnormal CT findings dating back to 2014.  Patient is accompanied by his wife.  Patient has multiple comorbidities including moderate dementia, coronary artery disease, congestive heart failure.  Patient has had minimum symptoms over the years with his pulmonary fibrosis gets short of breath with some heavy activity such as going up steps or an incline or walking a prolonged period of time.  He denies any cough.  Since last visit he has had no change in his activity level. O2 saturations today are 100% on  room air after walking from the lobby to the exam room. Previous autoimmune work-up in 2014 was negative.  Pulmonary function testing November 2020 showed no significant change in lung function and improvement in DLCO.  High-resolution CT chest 2020 showed moderate pulmonary fibrosis.  With slight progression since 2014.       Allergies  Allergen Reactions   Aricept [Donepezil] Other (See Comments)    Hallucination    Lipitor [Atorvastatin] Other (See Comments)    Stiff joints   Nsaids Other (See Comments)    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   Warfarin And Related Other (See Comments)    Stiff joints   Enbrel [Etanercept] Itching    Immunization History  Administered Date(s) Administered   Fluad Quad(high Dose 65+) 08/18/2019   Influenza, High Dose Seasonal PF 09/17/2018   PFIZER SARS-COV-2 Vaccination 06/21/2020, 07/12/2020   Tdap 08/14/2014, 07/06/2019    Past Medical History:  Diagnosis Date   (HFpEF) heart failure with preserved ejection fraction ([place])    a. 05/2013 Echo: EF 55%, mild LVH, diast dysfxn, Ao sclerosis, mildly dil LA, RV dysfxn (poorly visualized), PASP 6mmHg;  b. 03/2017 Echo: EF 55-60%, no rwma, triv MR, mildly dil RV, mod TR, PASP 53mmHg.   Atrial fibrillation [facility])    s/p Cox Maze 1/09;  Multaq Rx d/c'd in 2014 due to pulmo fibrosis;  coumadin d/c'd in 2014 due to spontaneous subdural hematoma   BPH (benign prostatic hyperplasia)    CAD (coronary artery disease), native coronary artery    a. s/p  CABG 12/2007;  b. Myoview 12/2011: EF 66%, no scar or ischemia; normal.   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 Jude   Peripheral neuropathy 07/31/2018   Pulmonary fibrosis ([place])    Multaq d/c'd 7/14   Subdural hematoma ([place]) 07/2012   spontaneous;  coumadin d/c'd => no longer a  candidate for anticoagulation    Tobacco History: Social History   Tobacco Use  Smoking Status Former Smoker   Packs/day: 2.00   Years: 20.00   Pack years: 40.00   Types: Cigarettes   Quit date: 02/21/1991   Years since quitting: 29.4  Smokeless Tobacco Never Used   Counseling given: Not Answered   Outpatient Medications Prior to Visit  Medication Sig Dispense Refill   acetaminophen (TYLENOL) 325 MG tablet Take 1-2 tablets (325-650 mg total) by mouth every 4 (four) hours as needed for mild pain.     allopurinol (ZYLOPRIM) 300 MG tablet Take 1 tablet (300 mg total) by mouth daily. 30 tablet 0   Baclofen 5 MG TABS 1 TABLET AS NEEDED TWICE A DAY AS NEEDED FOR MUSCLE SPASM ORALLY 30 DAY(S)     ciprofloxacin (CIPRO) 500 MG tablet Take 1 tablet (500 mg total) by mouth 2 (two) times daily. 20 tablet 0   docusate sodium (COLACE) 100 MG capsule Take 1 capsule (100 mg total) by mouth 2 (two) times daily. 10 capsule 0   doxycycline (VIBRA-TABS) 100 MG tablet Take 1 tablet (100 mg total) by mouth 2 (two) times daily. 20 tablet 0   ferrous sulfate 325 (65 FE) MG tablet Take 1 tablet (325 mg total) by mouth daily with breakfast. 30 tablet 3   gabapentin (NEURONTIN) 100 MG capsule Take 100 mg by mouth 3 (three) times daily.     methocarbamol (ROBAXIN) 500 MG tablet Take 1 tablet (500 mg total) by mouth every 6 (six) hours as needed for muscle spasms. 60 tablet 0   metoprolol succinate (TOPROL-XL) 25 MG 24 hr tablet Take 0.5 tablets (12.5 mg total) by mouth daily. 30 tablet 0   mirabegron ER (MYRBETRIQ) 50 MG TB24 tablet Take 1 tablet (50 mg total) by mouth daily. 30 tablet 3   Multiple Vitamin (MULTIVITAMIN WITH MINERALS) TABS tablet Take 1 tablet by mouth daily.     pantoprazole (PROTONIX) 40 MG tablet Take 1 tablet (40 mg total) by mouth daily. 30 tablet 0   polyethylene glycol (MIRALAX / GLYCOLAX) 17 g packet Take 17 g by mouth daily as needed for mild constipation. 14 each 0     rosuvastatin (CRESTOR) 40 MG tablet Take 1 tablet (40 mg total) by mouth daily. 90 tablet 2   sertraline (ZOLOFT) 100 MG tablet Take 2 tablets (200 mg total) by mouth daily. 30 tablet 0   tamsulosin (FLOMAX) 0.4 MG CAPS capsule Take 1 capsule (0.4 mg total) by mouth at bedtime. 30 capsule 1   torsemide (DEMADEX) 20 MG tablet Take 2 tablets in the morning and 1 tablet in the afternoon Take additional dose as needed for leg swellings/shortness of breath /weight gain     modafinil (PROVIGIL) 100 MG tablet Take 0.5 tablets (50 mg total) by mouth daily. (Patient not taking: Reported on 08/05/2020) 30 tablet 2   donepezil (ARICEPT) 10 MG tablet Take 10 mg by mouth at  bedtime. (Patient not taking: Reported on 08/05/2020)     No facility-administered medications prior to visit.     Review of Systems:   Constitutional:   No  weight loss, night sweats,  Fevers, chills, + fatigue, or  lassitude.  HEENT:   No headaches,  Difficulty swallowing,  Tooth/dental problems, or  Sore throat,                No sneezing, itching, ear ache, nasal congestion, post nasal drip,   CV:  No chest pain,  Orthopnea, PND, +welling in lower extremities, anasarca, dizziness, palpitations, syncope.   GI  No heartburn, indigestion, abdominal pain, nausea, vomiting, diarrhea, change in bowel habits, loss of appetite, bloody stools.   Resp:    No excess mucus, no productive cough,  No non-productive cough,  No coughing up of blood.  No change in color of mucus.  No wheezing.  No chest wall deformity  Skin: no rash or lesions.  GU: no dysuria, change in color of urine, no urgency or frequency.  No flank pain, no hematuria   MS:  No joint pain or swelling.  No decreased range of motion.  No back pain.    Physical Exam  BP 110/60 (BP Location: Left Arm, Cuff Size: Normal)    Pulse 99    Temp 97.6 F (36.4 C) (Temporal)    Ht 5\' 8"  (1.727 m)    Wt 189 lb 6.4 oz (85.9 kg)    SpO2 100%    BMI 28.80 kg/m   GEN:  A/Ox3; pleasant , NAD, elderly    HEENT:  [place]/AT,   NOSE-clear, THROAT-clear, no lesions, no postnasal drip or exudate noted.   NECK:  Supple w/ fair ROM; no JVD; normal carotid impulses w/o bruits; no thyromegaly or nodules palpated; no lymphadenopathy.    RESP faint bibasilar crackles  no accessory muscle use, no dullness to percussion  CARD:  RRR, no m/r/g, 1+ peripheral edema, pulses intact, no cyanosis or clubbing.  GI:   Soft & nt; nml bowel sounds; no organomegaly or masses detected.   Musco: Warm bil, no deformities or joint swelling noted.   Neuro: alert, no focal deficits noted.    Skin: Warm, no lesions or rashes    Lab Results:  CBC  BMET     PFT Results Latest Ref Rng & Units 10/28/2019  FVC-Pre L 3.05  FVC-Predicted Pre % 82  FVC-Post L 2.96  FVC-Predicted Post % 80  Pre FEV1/FVC % % 85  Post FEV1/FCV % % 88  FEV1-Pre L 2.59  FEV1-Predicted Pre % 97  FEV1-Post L 2.60  DLCO uncorrected ml/min/mmHg 15.51  DLCO UNC% % 68  DLCO corrected ml/min/mmHg 19.26  DLCO COR %Predicted % 84  DLVA Predicted % 107  TLC L 5.27  TLC % Predicted % 81  RV % Predicted % 101    No results found for: NITRICOXIDE      Assessment & Plan:   Pulmonary fibrosis (HCC) Pulmonary fibrosis with CT changes noted back as far as 2014.  Patient has had minimum breathing difficulties.  He does get short of breath with activity but is also very deconditioned with multiple comorbidities including moderate dementia.  Previous conversation with patient and wife as to continue with careful monitoring.  At this point we will hold off on antifibrotic's going forward. Check chest x-ray today. Activity as tolerated.  Plan  Patient Instructions  Chest xray today .  Activity as tolerated.  Follow up with  Dr.Olarlere in 6 months and As needed        OSA on CPAP Continue on CPAP at bedtime.  Continue follow-up with neurology  Chronic diastolic heart failure ([place]) Appears  compensated without any evidence of volume overload on exam.  Continue follow-up with primary care.     Rexene Edison, NP 08/05/2020

## 2020-08-05 NOTE — Patient Instructions (Signed)
Chest xray today .  Activity as tolerated.  Follow up with Dr.Olarlere in 6 months and As needed

## 2020-08-05 NOTE — Progress Notes (Signed)
Spoke with patient, provided CXR results per Rexene Edison NP.  He verbalized understanding.  Nothing further needed,.

## 2020-08-05 NOTE — Assessment & Plan Note (Signed)
Pulmonary fibrosis with CT changes noted back as far as 2014.  Patient has had minimum breathing difficulties.  He does get short of breath with activity but is also very deconditioned with multiple comorbidities including moderate dementia.  Previous conversation with patient and wife as to continue with careful monitoring.  At this point we will hold off on antifibrotic's going forward. Check chest x-ray today. Activity as tolerated.  Plan  Patient Instructions  Chest xray today .  Activity as tolerated.  Follow up with Dr.Olarlere in 6 months and As needed

## 2020-08-05 NOTE — Assessment & Plan Note (Signed)
Continue on CPAP at bedtime.  Continue follow-up with neurology

## 2020-08-09 ENCOUNTER — Ambulatory Visit (INDEPENDENT_AMBULATORY_CARE_PROVIDER_SITE_OTHER): Payer: Medicare Other | Admitting: Cardiology

## 2020-08-09 ENCOUNTER — Other Ambulatory Visit: Payer: Self-pay

## 2020-08-09 ENCOUNTER — Encounter: Payer: Self-pay | Admitting: Cardiology

## 2020-08-09 VITALS — BP 132/78 | HR 72 | Temp 96.9°F | Wt 187.2 lb

## 2020-08-09 DIAGNOSIS — I50812 Chronic right heart failure: Secondary | ICD-10-CM | POA: Diagnosis not present

## 2020-08-09 DIAGNOSIS — I480 Paroxysmal atrial fibrillation: Secondary | ICD-10-CM

## 2020-08-09 DIAGNOSIS — I10 Essential (primary) hypertension: Secondary | ICD-10-CM | POA: Diagnosis not present

## 2020-08-09 DIAGNOSIS — Z950 Presence of cardiac pacemaker: Secondary | ICD-10-CM | POA: Diagnosis not present

## 2020-08-09 DIAGNOSIS — I5032 Chronic diastolic (congestive) heart failure: Secondary | ICD-10-CM

## 2020-08-09 DIAGNOSIS — I2510 Atherosclerotic heart disease of native coronary artery without angina pectoris: Secondary | ICD-10-CM | POA: Diagnosis not present

## 2020-08-09 NOTE — Patient Instructions (Signed)

## 2020-08-10 ENCOUNTER — Ambulatory Visit (INDEPENDENT_AMBULATORY_CARE_PROVIDER_SITE_OTHER): Payer: Medicare Other | Admitting: Podiatry

## 2020-08-10 ENCOUNTER — Encounter: Payer: Self-pay | Admitting: Podiatry

## 2020-08-10 ENCOUNTER — Ambulatory Visit (INDEPENDENT_AMBULATORY_CARE_PROVIDER_SITE_OTHER): Payer: Medicare Other

## 2020-08-10 DIAGNOSIS — M869 Osteomyelitis, unspecified: Secondary | ICD-10-CM | POA: Diagnosis not present

## 2020-08-10 DIAGNOSIS — L03031 Cellulitis of right toe: Secondary | ICD-10-CM | POA: Diagnosis not present

## 2020-08-10 DIAGNOSIS — I2510 Atherosclerotic heart disease of native coronary artery without angina pectoris: Secondary | ICD-10-CM

## 2020-08-10 NOTE — Patient Instructions (Signed)

## 2020-08-11 DIAGNOSIS — Z01818 Encounter for other preprocedural examination: Secondary | ICD-10-CM | POA: Diagnosis not present

## 2020-08-11 DIAGNOSIS — M869 Osteomyelitis, unspecified: Secondary | ICD-10-CM | POA: Diagnosis not present

## 2020-08-12 NOTE — Progress Notes (Signed)
Need orders in epic.  proep on 08/13/20.  Thank You.

## 2020-08-13 ENCOUNTER — Inpatient Hospital Stay (HOSPITAL_COMMUNITY)
Admission: EM | Admit: 2020-08-13 | Discharge: 2020-08-20 | DRG: 853 | Disposition: A | Discharge status: Home Health | Payer: Medicare Other | Attending: Family Medicine | Admitting: Family Medicine

## 2020-08-13 ENCOUNTER — Other Ambulatory Visit: Payer: Self-pay

## 2020-08-13 ENCOUNTER — Inpatient Hospital Stay (HOSPITAL_COMMUNITY)
Admission: RE | Admit: 2020-08-13 | Discharge: 2020-08-13 | Disposition: A | Discharge status: Home / Self Care | Payer: Medicare Other | Source: Ambulatory Visit

## 2020-08-13 ENCOUNTER — Encounter (HOSPITAL_COMMUNITY): Payer: Self-pay

## 2020-08-13 ENCOUNTER — Telehealth: Payer: Self-pay | Admitting: Podiatry

## 2020-08-13 DIAGNOSIS — M86671 Other chronic osteomyelitis, right ankle and foot: Secondary | ICD-10-CM | POA: Diagnosis not present

## 2020-08-13 DIAGNOSIS — I495 Sick sinus syndrome: Secondary | ICD-10-CM | POA: Diagnosis present

## 2020-08-13 DIAGNOSIS — F0281 Dementia in other diseases classified elsewhere with behavioral disturbance: Secondary | ICD-10-CM | POA: Diagnosis present

## 2020-08-13 DIAGNOSIS — D61818 Other pancytopenia: Secondary | ICD-10-CM | POA: Diagnosis not present

## 2020-08-13 DIAGNOSIS — L089 Local infection of the skin and subcutaneous tissue, unspecified: Secondary | ICD-10-CM | POA: Diagnosis not present

## 2020-08-13 DIAGNOSIS — Z9889 Other specified postprocedural states: Secondary | ICD-10-CM

## 2020-08-13 DIAGNOSIS — M19071 Primary osteoarthritis, right ankle and foot: Secondary | ICD-10-CM | POA: Diagnosis not present

## 2020-08-13 DIAGNOSIS — Z20822 Contact with and (suspected) exposure to covid-19: Secondary | ICD-10-CM | POA: Diagnosis not present

## 2020-08-13 DIAGNOSIS — L0390 Cellulitis, unspecified: Secondary | ICD-10-CM | POA: Diagnosis not present

## 2020-08-13 DIAGNOSIS — E1142 Type 2 diabetes mellitus with diabetic polyneuropathy: Secondary | ICD-10-CM | POA: Diagnosis present

## 2020-08-13 DIAGNOSIS — I2510 Atherosclerotic heart disease of native coronary artery without angina pectoris: Secondary | ICD-10-CM | POA: Diagnosis present

## 2020-08-13 DIAGNOSIS — M7989 Other specified soft tissue disorders: Secondary | ICD-10-CM | POA: Diagnosis not present

## 2020-08-13 DIAGNOSIS — M86171 Other acute osteomyelitis, right ankle and foot: Secondary | ICD-10-CM | POA: Diagnosis not present

## 2020-08-13 DIAGNOSIS — J8410 Pulmonary fibrosis, unspecified: Secondary | ICD-10-CM | POA: Diagnosis not present

## 2020-08-13 DIAGNOSIS — G3183 Dementia with Lewy bodies: Secondary | ICD-10-CM | POA: Diagnosis not present

## 2020-08-13 DIAGNOSIS — G603 Idiopathic progressive neuropathy: Secondary | ICD-10-CM | POA: Diagnosis not present

## 2020-08-13 DIAGNOSIS — C61 Malignant neoplasm of prostate: Secondary | ICD-10-CM | POA: Diagnosis not present

## 2020-08-13 DIAGNOSIS — R6520 Severe sepsis without septic shock: Secondary | ICD-10-CM | POA: Diagnosis present

## 2020-08-13 DIAGNOSIS — G629 Polyneuropathy, unspecified: Secondary | ICD-10-CM

## 2020-08-13 DIAGNOSIS — I110 Hypertensive heart disease with heart failure: Secondary | ICD-10-CM | POA: Diagnosis not present

## 2020-08-13 DIAGNOSIS — N400 Enlarged prostate without lower urinary tract symptoms: Secondary | ICD-10-CM | POA: Diagnosis present

## 2020-08-13 DIAGNOSIS — Z89421 Acquired absence of other right toe(s): Secondary | ICD-10-CM | POA: Diagnosis not present

## 2020-08-13 DIAGNOSIS — E1169 Type 2 diabetes mellitus with other specified complication: Secondary | ICD-10-CM | POA: Diagnosis not present

## 2020-08-13 DIAGNOSIS — L03115 Cellulitis of right lower limb: Secondary | ICD-10-CM | POA: Diagnosis not present

## 2020-08-13 DIAGNOSIS — Z951 Presence of aortocoronary bypass graft: Secondary | ICD-10-CM | POA: Diagnosis not present

## 2020-08-13 DIAGNOSIS — M869 Osteomyelitis, unspecified: Secondary | ICD-10-CM | POA: Diagnosis present

## 2020-08-13 DIAGNOSIS — I5032 Chronic diastolic (congestive) heart failure: Secondary | ICD-10-CM | POA: Diagnosis not present

## 2020-08-13 DIAGNOSIS — N179 Acute kidney failure, unspecified: Secondary | ICD-10-CM | POA: Diagnosis not present

## 2020-08-13 DIAGNOSIS — E785 Hyperlipidemia, unspecified: Secondary | ICD-10-CM | POA: Diagnosis present

## 2020-08-13 DIAGNOSIS — R6521 Severe sepsis with septic shock: Secondary | ICD-10-CM | POA: Diagnosis present

## 2020-08-13 DIAGNOSIS — Z87891 Personal history of nicotine dependence: Secondary | ICD-10-CM | POA: Diagnosis not present

## 2020-08-13 DIAGNOSIS — E11621 Type 2 diabetes mellitus with foot ulcer: Secondary | ICD-10-CM | POA: Diagnosis not present

## 2020-08-13 DIAGNOSIS — A419 Sepsis, unspecified organism: Secondary | ICD-10-CM

## 2020-08-13 DIAGNOSIS — I872 Venous insufficiency (chronic) (peripheral): Secondary | ICD-10-CM | POA: Diagnosis present

## 2020-08-13 DIAGNOSIS — K7460 Unspecified cirrhosis of liver: Secondary | ICD-10-CM | POA: Diagnosis present

## 2020-08-13 DIAGNOSIS — G4700 Insomnia, unspecified: Secondary | ICD-10-CM | POA: Diagnosis present

## 2020-08-13 DIAGNOSIS — L03031 Cellulitis of right toe: Secondary | ICD-10-CM | POA: Diagnosis present

## 2020-08-13 DIAGNOSIS — D472 Monoclonal gammopathy: Secondary | ICD-10-CM | POA: Diagnosis present

## 2020-08-13 DIAGNOSIS — Z9989 Dependence on other enabling machines and devices: Secondary | ICD-10-CM | POA: Diagnosis not present

## 2020-08-13 DIAGNOSIS — D638 Anemia in other chronic diseases classified elsewhere: Secondary | ICD-10-CM | POA: Diagnosis present

## 2020-08-13 DIAGNOSIS — I480 Paroxysmal atrial fibrillation: Secondary | ICD-10-CM | POA: Diagnosis present

## 2020-08-13 DIAGNOSIS — G4733 Obstructive sleep apnea (adult) (pediatric): Secondary | ICD-10-CM

## 2020-08-13 DIAGNOSIS — E876 Hypokalemia: Secondary | ICD-10-CM | POA: Diagnosis present

## 2020-08-13 DIAGNOSIS — L97519 Non-pressure chronic ulcer of other part of right foot with unspecified severity: Secondary | ICD-10-CM | POA: Diagnosis not present

## 2020-08-13 DIAGNOSIS — Z950 Presence of cardiac pacemaker: Secondary | ICD-10-CM

## 2020-08-13 DIAGNOSIS — K760 Fatty (change of) liver, not elsewhere classified: Secondary | ICD-10-CM | POA: Diagnosis present

## 2020-08-13 DIAGNOSIS — I5082 Biventricular heart failure: Secondary | ICD-10-CM | POA: Diagnosis present

## 2020-08-13 DIAGNOSIS — S98131A Complete traumatic amputation of one right lesser toe, initial encounter: Secondary | ICD-10-CM | POA: Diagnosis not present

## 2020-08-13 LAB — CBC WITH DIFFERENTIAL/PLATELET
Abs Immature Granulocytes: 0.06 10*3/uL (ref 0.00–0.07)
Basophils Absolute: 0 10*3/uL (ref 0.0–0.1)
Basophils Relative: 0 %
Eosinophils Absolute: 0 10*3/uL (ref 0.0–0.5)
Eosinophils Relative: 0 %
HCT: 35.1 % — ABNORMAL LOW (ref 39.0–52.0)
Hemoglobin: 11.5 g/dL — ABNORMAL LOW (ref 13.0–17.0)
Immature Granulocytes: 1 %
Lymphocytes Relative: 7 %
Lymphs Abs: 0.6 10*3/uL — ABNORMAL LOW (ref 0.7–4.0)
MCH: 30.7 pg (ref 26.0–34.0)
MCHC: 32.8 g/dL (ref 30.0–36.0)
MCV: 93.6 fL (ref 80.0–100.0)
Monocytes Absolute: 0.3 10*3/uL (ref 0.1–1.0)
Monocytes Relative: 3 %
Neutro Abs: 7.6 10*3/uL (ref 1.7–7.7)
Neutrophils Relative %: 89 %
Platelets: 96 10*3/uL — ABNORMAL LOW (ref 150–400)
RBC: 3.75 MIL/uL — ABNORMAL LOW (ref 4.22–5.81)
RDW: 15 % (ref 11.5–15.5)
WBC: 8.5 10*3/uL (ref 4.0–10.5)
nRBC: 0 % (ref 0.0–0.2)

## 2020-08-13 LAB — COMPREHENSIVE METABOLIC PANEL
ALT: 32 U/L (ref 0–44)
AST: 45 U/L — ABNORMAL HIGH (ref 15–41)
Albumin: 4.3 g/dL (ref 3.5–5.0)
Alkaline Phosphatase: 89 U/L (ref 38–126)
Anion gap: 11 (ref 5–15)
BUN: 30 mg/dL — ABNORMAL HIGH (ref 8–23)
CO2: 28 mmol/L (ref 22–32)
Calcium: 9.1 mg/dL (ref 8.9–10.3)
Chloride: 102 mmol/L (ref 98–111)
Creatinine, Ser: 1.86 mg/dL — ABNORMAL HIGH (ref 0.61–1.24)
GFR calc Af Amer: 40 mL/min — ABNORMAL LOW (ref >60)
GFR calc non Af Amer: 34 mL/min — ABNORMAL LOW (ref >60)
Glucose, Bld: 126 mg/dL — ABNORMAL HIGH (ref 70–99)
Potassium: 4 mmol/L (ref 3.5–5.1)
Sodium: 141 mmol/L (ref 135–145)
Total Bilirubin: 1.6 mg/dL — ABNORMAL HIGH (ref 0.3–1.2)
Total Protein: 8.4 g/dL — ABNORMAL HIGH (ref 6.5–8.1)

## 2020-08-13 LAB — LACTIC ACID, PLASMA
Lactic Acid, Venous: 1.5 mmol/L (ref 0.5–1.9)
Lactic Acid, Venous: 1 mmol/L (ref 0.5–1.9)

## 2020-08-13 LAB — SARS CORONAVIRUS 2 BY RT PCR (HOSPITAL ORDER, PERFORMED IN ~~LOC~~ HOSPITAL LAB): SARS Coronavirus 2: NEGATIVE

## 2020-08-13 MED ORDER — MORPHINE SULFATE (PF) 4 MG/ML IV SOLN
4.0000 mg | Freq: Once | INTRAVENOUS | Status: AC
  Administered 2020-08-13: 4 mg via INTRAVENOUS
  Filled 2020-08-13: qty 1

## 2020-08-13 MED ORDER — DILTIAZEM HCL 25 MG/5ML IV SOLN
10.0000 mg | Freq: Once | INTRAVENOUS | Status: DC
  Filled 2020-08-13: qty 5

## 2020-08-13 MED ORDER — VANCOMYCIN HCL 1500 MG/300ML IV SOLN
1500.0000 mg | Freq: Once | INTRAVENOUS | Status: AC
  Administered 2020-08-13: 1500 mg via INTRAVENOUS
  Filled 2020-08-13: qty 300

## 2020-08-13 MED ORDER — VANCOMYCIN HCL IN DEXTROSE 1-5 GM/200ML-% IV SOLN
1000.0000 mg | INTRAVENOUS | Status: DC
  Administered 2020-08-14: 1000 mg via INTRAVENOUS
  Filled 2020-08-13 (×2): qty 200

## 2020-08-13 MED ORDER — ACETAMINOPHEN 325 MG PO TABS: 650.0000 mg | ORAL_TABLET | Freq: Once | ORAL | Status: DC

## 2020-08-13 MED ORDER — SODIUM CHLORIDE 0.9 % IV BOLUS
500.0000 mL | Freq: Once | INTRAVENOUS | Status: AC
  Administered 2020-08-13: 500 mL via INTRAVENOUS

## 2020-08-13 MED ORDER — DILTIAZEM HCL-DEXTROSE 125-5 MG/125ML-% IV SOLN (PREMIX)
5.0000 mg/h | INTRAVENOUS | Status: DC
  Administered 2020-08-13: 5 mg/h via INTRAVENOUS
  Filled 2020-08-13: qty 125

## 2020-08-13 MED ORDER — LACTATED RINGERS IV BOLUS: 500.0000 mL | Freq: Once | INTRAVENOUS | Status: DC

## 2020-08-13 MED ORDER — ACETAMINOPHEN 500 MG PO TABS
1000.0000 mg | ORAL_TABLET | Freq: Once | ORAL | Status: AC
  Administered 2020-08-13: 1000 mg via ORAL
  Filled 2020-08-13: qty 2

## 2020-08-13 MED ORDER — SODIUM CHLORIDE 0.9 % IV SOLN
2.0000 g | INTRAVENOUS | Status: AC
  Administered 2020-08-13: 2 g via INTRAVENOUS
  Filled 2020-08-13: qty 2

## 2020-08-13 MED ORDER — LACTATED RINGERS IV BOLUS
1000.0000 mL | Freq: Once | INTRAVENOUS | Status: AC
  Administered 2020-08-13: 1000 mL via INTRAVENOUS

## 2020-08-13 NOTE — Telephone Encounter (Signed)
My husband is scheduled for sx on Wednesday 9/1. He was scheduled to go get his blood work done today at [facility]. Last night he started throwing up and having some chills and body aches. I don't know if he's got a fever as my thermometer broke. I called the hospital and asked them, and they said to call Dr. Leigh Aurora office and see what they would like for [place] to do. If you can call me back, it would be greatly appreciated. Thank you very much.

## 2020-08-13 NOTE — Telephone Encounter (Signed)
If he is having those symptoms he needs to go to the ER for likely worsening infection. Please give him a call.

## 2020-08-13 NOTE — Telephone Encounter (Signed)
Called and spoke to pt's wife. Told her that pt needs to go to ER. She said they will be heading there shortly and tell them in the ER he's scheduled for sx on Wednesday and you are sending him due to his infection likely becoming worse.

## 2020-08-13 NOTE — Telephone Encounter (Signed)
Do you know what ER they are going to? Can you let me know or have Keely call the triage nurse to let them know why he is coming? Thanks.

## 2020-08-13 NOTE — Progress Notes (Signed)
DUE TO COVID-19 ONLY ONE VISITOR IS ALLOWED TO COME WITH YOU AND STAY IN THE WAITING ROOM ONLY DURING PRE OP AND PROCEDURE DAY OF SURGERY. THE 1 VISITOR  MAY VISIT WITH YOU AFTER SURGERY IN YOUR PRIVATE ROOM DURING VISITING HOURS ONLY!  YOU NEED TO HAVE A COVID 19 TEST ON_______ @_______ , THIS TEST MUST BE DONE BEFORE SURGERY,  COVID TESTING SITE [address] [place] [address], IT IS ON THE RIGHT GOING OUT [address] APPROXIMATELY  2 MINUTES PAST [employer] ON THE RIGHT. ONCE YOUR COVID TEST IS COMPLETED,  PLEASE BEGIN THE QUARANTINE INSTRUCTIONS AS OUTLINED IN YOUR HANDOUT.                [name].  08/13/2020   Your procedure is scheduled on: 08/18/2020    Report to [facility] Main  Entrance   Report to admitting at   1030 AM     Call this number if you have problems the morning of surgery [phone]    Remember: Do not eat food , candy gum or mints :After Midnight. You may have clear liquids from midnight until 0930am    CLEAR LIQUID DIET   Foods Allowed                                                                       Coffee and tea, regular and decaf                              Plain Jell-O any favor except red or purple                                            Fruit ices (not with fruit pulp)                                      Iced Popsicles                                     Carbonated beverages, regular and diet                                    Cranberry, grape and apple juices Sports drinks like Gatorade Lightly seasoned clear broth or consume(fat free) Sugar, honey syrup   _____________________________________________________________________    BRUSH YOUR TEETH MORNING OF SURGERY AND RINSE YOUR MOUTH OUT, NO CHEWING GUM CANDY OR MINTS.     Take these medicines the morning of surgery with A SIP OF WATER:  Allopurinol, Toprol, Myrbetriq, Zoloft DO NOT TAKE ANY DIABETIC MEDICATIONS DAY OF YOUR SURGERY                                You may not have any metal on your  body including hair pins and              piercings  Do not wear jewelry, make-up, lotions, powders or perfumes, deodorant             Do not wear nail polish on your fingernails.  Do not shave  48 hours prior to surgery.              Men may shave face and neck.   Do not bring valuables to the hospital. [place].  Contacts, dentures or bridgework may not be worn into surgery.  Leave suitcase in the car. After surgery it may be brought to your room.     Patients discharged the day of surgery will not be allowed to drive home. IF YOU ARE HAVING SURGERY AND GOING HOME THE SAME DAY, YOU MUST HAVE AN ADULT TO DRIVE YOU HOME AND BE WITH YOU FOR 24 HOURS. YOU MAY GO HOME BY TAXI OR UBER OR ORTHERWISE, BUT AN ADULT MUST ACCOMPANY YOU HOME AND STAY WITH YOU FOR 24 HOURS.  Name and phone number of your driver:  Special Instructions: N/A              Please read over the following fact sheets you were given: _____________________________________________________________________  [facility] - Preparing for Surgery Before surgery, you can play an important role.  Because skin is not sterile, your skin needs to be as free of germs as possible.  You can reduce the number of germs on your skin by washing with CHG (chlorahexidine gluconate) soap before surgery.  CHG is an antiseptic cleaner which kills germs and bonds with the skin to continue killing germs even after washing. Please DO NOT use if you have an allergy to CHG or antibacterial soaps.  If your skin becomes reddened/irritated stop using the CHG and inform your nurse when you arrive at Short Stay. Do not shave (including legs and underarms) for at least 48 hours prior to the first CHG shower.  You may shave your face/neck. Please follow these instructions carefully:  1.  Shower with CHG Soap the night before surgery and the  morning of  Surgery.  2.  If you choose to wash your hair, wash your hair first as usual with your  normal  shampoo.  3.  After you shampoo, rinse your hair and body thoroughly to remove the  shampoo.                           4.  Use CHG as you would any other liquid soap.  You can apply chg directly  to the skin and wash                       Gently with a scrungie or clean washcloth.  5.  Apply the CHG Soap to your body ONLY FROM THE NECK DOWN.   Do not use on face/ open                           Wound or open sores. Avoid contact with eyes, ears mouth and genitals (private parts).                       Wash face,  Development worker, international aid (private  parts) with your normal soap.             6.  Wash thoroughly, paying special attention to the area where your surgery  will be performed.  7.  Thoroughly rinse your body with warm water from the neck down.  8.  DO NOT shower/wash with your normal soap after using and rinsing off  the CHG Soap.                9.  Pat yourself dry with a clean towel.            10.  Wear clean pajamas.            11.  Place clean sheets on your bed the night of your first shower and do not  sleep with pets. Day of Surgery : Do not apply any lotions/deodorants the morning of surgery.  Please wear clean clothes to the hospital/surgery center.  FAILURE TO FOLLOW THESE INSTRUCTIONS MAY RESULT IN THE CANCELLATION OF YOUR SURGERY PATIENT SIGNATURE_________________________________  NURSE SIGNATURE__________________________________  ________________________________________________________________________

## 2020-08-13 NOTE — Progress Notes (Signed)
Pharmacy Antibiotic Note  [name]. is a 79 y.o. male admitted on 08/13/2020 with c/o worsening right leg swelling and redness. Scheduled for toe amputation on same leg on  9/1.  [name]  Pharmacy has been consulted for vancomycin for cellulitis.  Plan: Vancomycin 1500mg  IV x 1 then 1gm q24h Follow renal function, cultures and clinical course     Temp (24hrs), Avg:99.4 F (37.4 C), Min:98.9 F (37.2 C), Max:99.9 F (37.7 C)  Recent Labs  Lab 08/13/20 1314  WBC 8.5  CREATININE 1.86*  LATICACIDVEN 1.5    Estimated Creatinine Clearance: 35.3 mL/min (A) (by C-G formula based on SCr of 1.86 mg/dL (H)).    Allergies  Allergen Reactions  . Aricept [Donepezil] Other (See Comments)    Hallucination   . Lipitor [Atorvastatin] Other (See Comments)    Stiff joints  . 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 Warfarin And Related Other (See Comments)    Stiff joints  . Enbrel [Etanercept] Itching    Antimicrobials this admission: 8/27 vanc 8/27 cefepime x 1 Dose adjustments this admission:   Microbiology results:  Thank you for allowing pharmacy to be a part of this patient's care.  Dolly Rias RPh 08/13/2020, 7:44 PM

## 2020-08-13 NOTE — ED Triage Notes (Signed)
Pt arrived POV, c/o worsening right leg swelling and redness. States he has been taking lasix as prescribed. Scheduled for toe amputation on same leg on wens 9/1.

## 2020-08-13 NOTE — Telephone Encounter (Signed)
They are going to [facility] .

## 2020-08-13 NOTE — Progress Notes (Signed)
Pt called in to [facility] and spoke with Scheduler, Hassan Rowan.  Patient reports per [facility] that he has chills, vomiting, etc.  Shameeka to instruct pt to call surgeon office and iinform them and let them be aware and do not come in for preop that is scheduled for 08/13/20.

## 2020-08-13 NOTE — ED Notes (Signed)
Sent a blue and green top down to lab.

## 2020-08-13 NOTE — Telephone Encounter (Signed)
Called and spoke to Opal at [facility] to let them know if his arrival.

## 2020-08-14 ENCOUNTER — Inpatient Hospital Stay (HOSPITAL_COMMUNITY): Payer: Medicare Other

## 2020-08-14 DIAGNOSIS — A419 Sepsis, unspecified organism: Principal | ICD-10-CM

## 2020-08-14 DIAGNOSIS — M86671 Other chronic osteomyelitis, right ankle and foot: Secondary | ICD-10-CM

## 2020-08-14 DIAGNOSIS — R6520 Severe sepsis without septic shock: Secondary | ICD-10-CM

## 2020-08-14 LAB — CBC
HCT: 29.3 % — ABNORMAL LOW (ref 39.0–52.0)
Hemoglobin: 9.7 g/dL — ABNORMAL LOW (ref 13.0–17.0)
MCH: 31.4 pg (ref 26.0–34.0)
MCHC: 33.1 g/dL (ref 30.0–36.0)
MCV: 94.8 fL (ref 80.0–100.0)
Platelets: 75 10*3/uL — ABNORMAL LOW (ref 150–400)
RBC: 3.09 MIL/uL — ABNORMAL LOW (ref 4.22–5.81)
RDW: 15 % (ref 11.5–15.5)
WBC: 7.1 10*3/uL (ref 4.0–10.5)
nRBC: 0 % (ref 0.0–0.2)

## 2020-08-14 LAB — COMPREHENSIVE METABOLIC PANEL
ALT: 27 U/L (ref 0–44)
AST: 41 U/L (ref 15–41)
Albumin: 3.2 g/dL — ABNORMAL LOW (ref 3.5–5.0)
Alkaline Phosphatase: 61 U/L (ref 38–126)
Anion gap: 11 (ref 5–15)
BUN: 30 mg/dL — ABNORMAL HIGH (ref 8–23)
CO2: 26 mmol/L (ref 22–32)
Calcium: 8.5 mg/dL — ABNORMAL LOW (ref 8.9–10.3)
Chloride: 101 mmol/L (ref 98–111)
Creatinine, Ser: 1.59 mg/dL — ABNORMAL HIGH (ref 0.61–1.24)
GFR calc Af Amer: 48 mL/min — ABNORMAL LOW (ref >60)
GFR calc non Af Amer: 41 mL/min — ABNORMAL LOW (ref >60)
Glucose, Bld: 106 mg/dL — ABNORMAL HIGH (ref 70–99)
Potassium: 3.1 mmol/L — ABNORMAL LOW (ref 3.5–5.1)
Sodium: 138 mmol/L (ref 135–145)
Total Bilirubin: 1.3 mg/dL — ABNORMAL HIGH (ref 0.3–1.2)
Total Protein: 6.6 g/dL (ref 6.5–8.1)

## 2020-08-14 LAB — PROCALCITONIN: Procalcitonin: 4.78 ng/mL

## 2020-08-14 LAB — PROTIME-INR
INR: 1.6 — ABNORMAL HIGH (ref 0.8–1.2)
Prothrombin Time: 18.7 seconds — ABNORMAL HIGH (ref 11.4–15.2)

## 2020-08-14 LAB — CORTISOL-AM, BLOOD: Cortisol - AM: 15.8 ug/dL (ref 6.7–22.6)

## 2020-08-14 MED ORDER — SODIUM CHLORIDE 0.9 % IV BOLUS
500.0000 mL | Freq: Once | INTRAVENOUS | Status: AC
  Administered 2020-08-14: 500 mL via INTRAVENOUS

## 2020-08-14 MED ORDER — METHOCARBAMOL 500 MG PO TABS
500.0000 mg | ORAL_TABLET | Freq: Four times a day (QID) | ORAL | Status: DC | PRN
  Administered 2020-08-14 – 2020-08-17 (×3): 500 mg via ORAL
  Filled 2020-08-14 (×3): qty 1

## 2020-08-14 MED ORDER — ONDANSETRON HCL 4 MG PO TABS: 4.0000 mg | ORAL_TABLET | Freq: Four times a day (QID) | ORAL | Status: DC | PRN

## 2020-08-14 MED ORDER — ADULT MULTIVITAMIN W/MINERALS CH
1.0000 | ORAL_TABLET | Freq: Every day | ORAL | Status: DC
  Administered 2020-08-14 – 2020-08-20 (×6): 1 via ORAL
  Filled 2020-08-14 (×6): qty 1

## 2020-08-14 MED ORDER — TAMSULOSIN HCL 0.4 MG PO CAPS
0.4000 mg | ORAL_CAPSULE | Freq: Every day | ORAL | Status: DC
  Administered 2020-08-14 – 2020-08-19 (×7): 0.4 mg via ORAL
  Filled 2020-08-14 (×7): qty 1

## 2020-08-14 MED ORDER — VANCOMYCIN HCL IN DEXTROSE 1-5 GM/200ML-% IV SOLN
1000.0000 mg | Freq: Once | INTRAVENOUS | Status: AC
  Administered 2020-08-14: 1000 mg via INTRAVENOUS

## 2020-08-14 MED ORDER — SODIUM CHLORIDE 0.9 % IV SOLN
2.0000 g | INTRAVENOUS | Status: DC
  Administered 2020-08-14 – 2020-08-20 (×7): 2 g via INTRAVENOUS
  Filled 2020-08-14: qty 2
  Filled 2020-08-14: qty 20
  Filled 2020-08-14: qty 2
  Filled 2020-08-14 (×2): qty 20
  Filled 2020-08-14 (×2): qty 2
  Filled 2020-08-14: qty 20

## 2020-08-14 MED ORDER — LACTATED RINGERS IV SOLN: INTRAVENOUS | Status: DC

## 2020-08-14 MED ORDER — METOPROLOL SUCCINATE ER 25 MG PO TB24
12.5000 mg | ORAL_TABLET | Freq: Every day | ORAL | Status: DC
  Administered 2020-08-14 – 2020-08-15 (×2): 12.5 mg via ORAL
  Filled 2020-08-14: qty 1
  Filled 2020-08-14: qty 0.5

## 2020-08-14 MED ORDER — MODAFINIL 100 MG PO TABS: 50.0000 mg | ORAL_TABLET | Freq: Every day | ORAL | Status: DC

## 2020-08-14 MED ORDER — SERTRALINE HCL 100 MG PO TABS
200.0000 mg | ORAL_TABLET | Freq: Every day | ORAL | Status: DC
  Administered 2020-08-14 – 2020-08-20 (×6): 200 mg via ORAL
  Filled 2020-08-14 (×3): qty 2
  Filled 2020-08-14: qty 4
  Filled 2020-08-14 (×2): qty 2

## 2020-08-14 MED ORDER — LACTATED RINGERS IV SOLN: INTRAVENOUS | Status: AC

## 2020-08-14 MED ORDER — MIRABEGRON ER 25 MG PO TB24
50.0000 mg | ORAL_TABLET | Freq: Every day | ORAL | Status: DC
  Administered 2020-08-14 – 2020-08-20 (×6): 50 mg via ORAL
  Filled 2020-08-14 (×8): qty 2

## 2020-08-14 MED ORDER — PANTOPRAZOLE SODIUM 40 MG PO TBEC
40.0000 mg | DELAYED_RELEASE_TABLET | Freq: Every day | ORAL | Status: DC
  Administered 2020-08-14 – 2020-08-20 (×6): 40 mg via ORAL
  Filled 2020-08-14 (×6): qty 1

## 2020-08-14 MED ORDER — ALLOPURINOL 300 MG PO TABS
300.0000 mg | ORAL_TABLET | Freq: Every day | ORAL | Status: DC
  Administered 2020-08-14 – 2020-08-20 (×6): 300 mg via ORAL
  Filled 2020-08-14 (×6): qty 1

## 2020-08-14 MED ORDER — ONDANSETRON HCL 4 MG/2ML IJ SOLN: 4.0000 mg | Freq: Four times a day (QID) | INTRAMUSCULAR | Status: DC | PRN

## 2020-08-14 MED ORDER — ENOXAPARIN SODIUM 40 MG/0.4ML ~~LOC~~ SOLN
40.0000 mg | SUBCUTANEOUS | Status: DC
  Administered 2020-08-14 – 2020-08-20 (×6): 40 mg via SUBCUTANEOUS
  Filled 2020-08-14 (×6): qty 0.4

## 2020-08-14 MED ORDER — ROSUVASTATIN CALCIUM 20 MG PO TABS
40.0000 mg | ORAL_TABLET | Freq: Every day | ORAL | Status: DC
  Administered 2020-08-14 – 2020-08-20 (×6): 40 mg via ORAL
  Filled 2020-08-14 (×6): qty 2

## 2020-08-14 MED ORDER — FERROUS SULFATE 325 (65 FE) MG PO TABS
325.0000 mg | ORAL_TABLET | Freq: Every day | ORAL | Status: DC
  Administered 2020-08-14 – 2020-08-20 (×6): 325 mg via ORAL
  Filled 2020-08-14 (×6): qty 1

## 2020-08-14 NOTE — ED Notes (Signed)
Assumed care of pt. AAOX4. Pt in no apparent distress or pain. Awaiting further orders.

## 2020-08-14 NOTE — H&P (Signed)
History and Physical   [name]. PYP:[id number] DOB: [birth year] DOA: 08/13/2020  Referring MD/NP/PA: Dr. Verta Ellen  PCP: [name], MD   Outpatient Specialists: None  Patient coming from: Home  Chief Complaint: Fever and right lower extremity swelling  HPI: [name]. is a 79 y.o. male with medical history significant of peripheral neuropathy, diastolic dysfunction CHF, atrial fibrillation, BPH, coronary artery disease status post CABG in 2009, diabetes, hyperlipidemia, hypertension, obstructive sleep apnea, status post pacemaker placement, Lewy body dementia and pulmonary fibrosis who has had previous amputation of the right fourth toe and the left fourth and fifth toe presenting to the ER with right lower extremity swelling redness and pain.  Patient was noted to be having significant right lower extremity cellulitis.  Associated with SIRS symptoms including temperature of 102.1 pulse is 154 and then significant hypotension making him severe sepsis.  Patient also has slight worsening his creatinine.  He is being admitted therefore with severe sepsis secondary to right lower extremity cellulitis.  He has had fever on and off for couple of days.  Patient has peripheral neuropathy was still feels pain.  Denied any other source of infection.  No recent injuries..  ED Course: Temperature 102.1 blood pressure 83/54 pulse 154 respirate of 26 oxygen sats 91% on room air.  His white count is 8.5 hemoglobin 11.5 platelet 96.  Sodium 141 potassium 4.0 chloride 102 CO2 28 BUN 30 creatinine 1.86.  Lactic acid 1.5.  Total bilirubin is 1.6.  X-ray of the right foot showed no osteomyelitis.  Patient is being admitted to the hospital for further evaluation and treatment.  Review of Systems: As per HPI otherwise 10 point review of systems negative.    Past Medical History:  Diagnosis Date  . (HFpEF) heart failure with preserved ejection fraction ([place])    a. 05/2013 Echo: EF 55%, mild LVH,  diast dysfxn, Ao sclerosis, mildly dil LA, RV dysfxn (poorly visualized), PASP 70mmHg;  b. 03/2017 Echo: EF 55-60%, no rwma, triv MR, mildly dil RV, mod TR, PASP 15mmHg.  . Atrial fibrillation [facility])    s/p Cox Maze 1/09;  Multaq Rx d/c'd in 2014 due to pulmo fibrosis;  coumadin d/c'd in 2014 due to spontaneous subdural hematoma  . BPH (benign prostatic hyperplasia)   . CAD (coronary artery disease), native coronary artery    a. s/p CABG 12/2007;  b. Myoview 12/2011: EF 66%, no scar or ischemia; normal.  . DM (diabetes mellitus) ([place])   . Hyperlipidemia type II   . Hypertension   . 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 OSA (obstructive sleep apnea)   . Pacemaker    PPM - St. Jude  . Peripheral neuropathy 07/31/2018  . Pulmonary fibrosis ([place])    Multaq d/c'd 7/14  . Subdural hematoma ([place]) 07/2012   spontaneous;  coumadin d/c'd => no longer a candidate for anticoagulation    Past Surgical History:  Procedure Laterality Date  . AMPUTATION Left 05/17/2019   Procedure: AMPUTATION LEFT FOURTH TOE;  Surgeon: Meredith Pel, MD;  Location: [place];  Service: Orthopedics;  Laterality: Left;  . AMPUTATION TOE Right 02/06/2020   Procedure: AMPUTATION TOE;  Surgeon: Newt Minion, MD;  Location: [place];  Service: Orthopedics;  Laterality: Right;  . APPENDECTOMY    . CHOLECYSTECTOMY    . CORONARY ARTERY BYPASS GRAFT     x3 (left internal mammary artery to distal left anterior descending coronary artery, saphenous vain  graft to second circumflex marginal branch, saphenous vain graft to posterior descending coronary artery, endoscopic saphenous vain harvest from right thigh) and modified Cox - Maze IV procedure.  [name],MD. Electronically signed CHO/MEDQ D: 01/09/2008/ JOB: [id number] cc:  [name]  . [name]  07/30/2012   Procedure: CRANIOTOMY HEMATOMA EVACUATION SUBDURAL;  Surgeon: [name], MD;  Location: [place] [facility];  Service: Neurosurgery;   Laterality: Right;  Right craniotomy for evacuation of subdural hematoma  . FOOT SURGERY    . HERNIA REPAIR    . INTRAMEDULLARY (IM) NAIL INTERTROCHANTERIC Right 02/04/2020   Procedure: INTRAMEDULLARY (IM) NAIL INTERTROCHANTRIC;  Surgeon: Netta Cedars, MD;  Location: [place];  Service: Orthopedics;  Laterality: Right;  . ORCHIECTOMY     Left  /  testicular cancer  . PACEMAKER PLACEMENT     PPM - St. Jude  . PPM GENERATOR CHANGEOUT N/A 06/25/2019   Procedure: PPM GENERATOR CHANGEOUT;  Surgeon: Evans Lance, MD;  Location: [place] [facility];  Service: Cardiovascular;  Laterality: N/A;     reports that he quit smoking about 29 years ago. His smoking use included cigarettes. He has a 40.00 pack-year smoking history. He has never used smokeless tobacco. He reports that he does not drink alcohol and does not use drugs.  Allergies  Allergen Reactions  . Aricept [Donepezil] Other (See Comments)    Hallucination   . Lipitor [Atorvastatin] Other (See Comments)    Stiff joints  . 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 Warfarin And Related Other (See Comments)    Stiff joints  . Enbrel [Etanercept] Itching    Family History  Problem Relation Age of Onset  . Heart disease Father   . Heart attack Father   . Heart failure Father   . Heart disease Mother   . Alzheimer's disease Mother   . Dementia Mother      Prior to Admission medications   Medication Sig Start Date End Date Taking? Authorizing Provider  allopurinol (ZYLOPRIM) 300 MG tablet Take 1 tablet (300 mg total) by mouth daily. 02/24/20  Yes Angiulli, Lavon Paganini, PA-C  ferrous sulfate 325 (65 FE) MG tablet Take 1 tablet (325 mg total) by mouth daily with breakfast. 02/24/20  Yes Angiulli, Lavon Paganini, PA-C  methocarbamol (ROBAXIN) 500 MG tablet Take 1 tablet (500 mg total) by mouth every 6 (six) hours as needed for muscle spasms. 02/24/20  Yes Angiulli, Lavon Paganini, PA-C  metoprolol succinate (TOPROL-XL) 25 MG 24 hr  tablet Take 0.5 tablets (12.5 mg total) by mouth daily. 02/24/20  Yes Angiulli, Lavon Paganini, PA-C  mirabegron ER (MYRBETRIQ) 50 MG TB24 tablet Take 1 tablet (50 mg total) by mouth daily. 02/24/20  Yes Angiulli, Lavon Paganini, PA-C  modafinil (PROVIGIL) 100 MG tablet Take 0.5 tablets (50 mg total) by mouth daily. 05/26/20  Yes Ward Givens, NP  Multiple Vitamin (MULTIVITAMIN WITH MINERALS) TABS tablet Take 1 tablet by mouth daily. 02/10/20  Yes Guilford Shi, MD  pantoprazole (PROTONIX) 40 MG tablet Take 1 tablet (40 mg total) by mouth daily. 02/24/20  Yes Angiulli, Lavon Paganini, PA-C  rosuvastatin (CRESTOR) 40 MG tablet Take 1 tablet (40 mg total) by mouth daily. 02/24/20  Yes Angiulli, Lavon Paganini, PA-C  sertraline (ZOLOFT) 100 MG tablet Take 2 tablets (200 mg total) by mouth daily. 02/24/20  Yes Angiulli, Lavon Paganini, PA-C  tamsulosin (FLOMAX) 0.4 MG CAPS capsule Take 1 capsule (0.4 mg total)  by mouth at bedtime. 02/24/20  Yes Angiulli, Lavon Paganini, PA-C  torsemide (DEMADEX) 20 MG tablet Take 2 tablets in the morning and 1 tablet in the afternoon Take additional dose as needed for leg swellings/shortness of breath /weight gain Patient taking differently: Take 20-40 mg by mouth See admin instructions. Take 40 mg by mouth in the morning and 20 mg in the afternoon Take additional dose as needed for leg swellings/shortness of breath /weight gain 04/07/20  Yes Crenshaw, [name], MD  acetaminophen (TYLENOL) 325 MG tablet Take 1-2 tablets (325-650 mg total) by mouth every 4 (four) hours as needed for mild pain. Patient not taking: Reported on 08/13/2020 02/24/20   Angiulli, Lavon Paganini, PA-C  ciprofloxacin (CIPRO) 500 MG tablet Take 1 tablet (500 mg total) by mouth 2 (two) times daily. Patient not taking: Reported on 08/13/2020 08/02/20   Trula Slade, DPM  docusate sodium (COLACE) 100 MG capsule Take 1 capsule (100 mg total) by mouth 2 (two) times daily. Patient not taking: Reported on 08/12/2020 02/09/20   Guilford Shi, MD    gabapentin (NEURONTIN) 100 MG capsule Take 100 mg by mouth 3 (three) times daily. Patient not taking: Reported on 08/12/2020 01/31/20   [provider]  polyethylene glycol (MIRALAX / GLYCOLAX) 17 g packet Take 17 g by mouth daily as needed for mild constipation. Patient not taking: Reported on 08/13/2020 02/09/20   Guilford Shi, MD    Physical Exam: Vitals:   08/13/20 2315 08/13/20 2340 08/14/20 0000 08/14/20 0030  BP: (!) 84/47 (!) 89/55 (!) 86/50 (!) 88/51  Pulse: (!) 120 (!) 118 (!) 118 (!) 119  Resp: 16 17 14 14   Temp:      TempSrc:      SpO2: 95% 94% 94% 94%      Constitutional: Acutely ill looking,Comfortable, no distress Vitals:   08/13/20 2315 08/13/20 2340 08/14/20 0000 08/14/20 0030  BP: (!) 84/47 (!) 89/55 (!) 86/50 (!) 88/51  Pulse: (!) 120 (!) 118 (!) 118 (!) 119  Resp: 16 17 14 14   Temp:      TempSrc:      SpO2: 95% 94% 94% 94%   Eyes: PERRL, lids and conjunctivae normal ENMT: Mucous membranes are moist. Posterior pharynx clear of any exudate or lesions.Normal dentition.  Neck: normal, supple, no masses, no thyromegaly Respiratory: clear to auscultation bilaterally, no wheezing, no crackles. Normal respiratory effort. No accessory muscle use.  Cardiovascular: Sinus tachycardia, no murmurs / rubs / gallops. No extremity edema. 2+ pedal pulses. No carotid bruits.  Abdomen: no tenderness, no masses palpated. No hepatosplenomegaly. Bowel sounds positive.  Musculoskeletal: Right lower extremity is swollen red tender with visible ulcer at the tip of the fourth toe, left lower extremity status post amputation of the fourth and fifth toe. Good ROM, no contractures. Normal muscle tone.  Skin: Chronic stasis dermatitis bilaterally no rashes, lesions, ulcers. No induration Neurologic: CN 2-12 grossly intact. Sensation intact, DTR normal. Strength 5/5 in all 4.  Psychiatric: Pleasantly confused, no agitation    Labs on Admission: I have personally reviewed  following labs and imaging studies  CBC: Recent Labs  Lab 08/13/20 1314  WBC 8.5  NEUTROABS 7.6  HGB 11.5*  HCT 35.1*  MCV 93.6  PLT 96*   Basic Metabolic Panel: Recent Labs  Lab 08/13/20 1314  NA 141  K 4.0  CL 102  CO2 28  GLUCOSE 126*  BUN 30*  CREATININE 1.86*  CALCIUM 9.1   GFR: Estimated Creatinine Clearance:  35.3 mL/min (A) (by C-G formula based on SCr of 1.86 mg/dL (H)). Liver Function Tests: Recent Labs  Lab 08/13/20 1314  AST 45*  ALT 32  ALKPHOS 89  BILITOT 1.6*  PROT 8.4*  ALBUMIN 4.3   No results for input(s): LIPASE, AMYLASE in the last 168 hours. No results for input(s): AMMONIA in the last 168 hours. Coagulation Profile: No results for input(s): INR, PROTIME in the last 168 hours. Cardiac Enzymes: No results for input(s): CKTOTAL, CKMB, CKMBINDEX, TROPONINI in the last 168 hours. BNP (last 3 results) No results for input(s): PROBNP in the last 8760 hours. HbA1C: No results for input(s): HGBA1C in the last 72 hours. CBG: No results for input(s): GLUCAP in the last 168 hours. Lipid Profile: No results for input(s): CHOL, HDL, LDLCALC, TRIG, CHOLHDL, LDLDIRECT in the last 72 hours. Thyroid Function Tests: No results for input(s): TSH, T4TOTAL, FREET4, T3FREE, THYROIDAB in the last 72 hours. Anemia Panel: No results for input(s): VITAMINB12, FOLATE, FERRITIN, TIBC, IRON, RETICCTPCT in the last 72 hours. Urine analysis:    Component Value Date/Time   COLORURINE YELLOW 02/06/2020 0831   APPEARANCEUR CLEAR 02/06/2020 0831   LABSPEC 1.015 02/06/2020 0831   PHURINE 5.0 02/06/2020 0831   GLUCOSEU NEGATIVE 02/06/2020 0831   HGBUR NEGATIVE 02/06/2020 0831   BILIRUBINUR NEGATIVE 02/06/2020 0831   KETONESUR NEGATIVE 02/06/2020 0831   PROTEINUR NEGATIVE 02/06/2020 0831   UROBILINOGEN 0.2 01/07/2008 1137   NITRITE NEGATIVE 02/06/2020 0831   LEUKOCYTESUR NEGATIVE 02/06/2020 0831   Sepsis  Labs: @LABRCNTIP (procalcitonin:4,lacticidven:4) ) Recent Results (from the past 240 hour(s))  SARS Coronavirus 2 by RT PCR (hospital order, performed in [facility] hospital lab) Nasopharyngeal Nasopharyngeal Swab     Status: None   Collection Time: 08/13/20  8:12 PM   Specimen: Nasopharyngeal Swab  Result Value Ref Range Status   SARS Coronavirus 2 NEGATIVE NEGATIVE Final    Comment: (NOTE) SARS-CoV-2 target nucleic acids are NOT DETECTED.  The SARS-CoV-2 RNA is generally detectable in upper and lower respiratory specimens during the acute phase of infection. The lowest concentration of SARS-CoV-2 viral copies this assay can detect is 250 copies / mL. A negative result does not preclude SARS-CoV-2 infection and should not be used as the sole basis for treatment or other patient management decisions.  A negative result may occur with improper specimen collection / handling, submission of specimen other than nasopharyngeal swab, presence of viral mutation(s) within the areas targeted by this assay, and inadequate number of viral copies (<250 copies / mL). A negative result must be combined with clinical observations, patient history, and epidemiological information.  Fact Sheet for Patients:   StrictlyIdeas.no  Fact Sheet for Healthcare Providers: BankingDealers.co.za  This test is not yet approved or  cleared by the [place] FDA and has been authorized for detection and/or diagnosis of SARS-CoV-2 by FDA under an Emergency Use Authorization (EUA).  This EUA will remain in effect (meaning this test can be used) for the duration of the COVID-19 declaration under Section 564(b)(1) of the Act, 21 U.S.C. section 360bbb-3(b)(1), unless the authorization is terminated or revoked sooner.  Performed at [facility], [place] [address], [place], [place] [address]      Radiological Exams on Admission: DG Foot Complete  Right  Result Date: 08/13/2020 Please see detailed radiograph report in office note.   EKG: Independently reviewed.  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  He is bordering on septic shock.  Clinically however no evidence of shock.  Aggressively hydrate and monitor.  IV antibiotics per protocol.  Follow blood cultures.  Wound care.  #2 right lower extremity cellulitis:Also infection of the right fourth toe which may be the source.  We will continue with vancomycin and Rocephin per protocol.  Follow blood cultures as above  #3 mild memory impairment: Secondary to Lewy body dementia early on.  No agitation at this point.  Continue treatment  #4 obstructive sleep apnea: On CPAP at home.  Will resume  #5 pulmonary fibrosis: Stable.  No acute respiratory distress.  #6 peripheral neuropathy: Continue home regimen.  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  #8 hypertension: Hold blood pressure medications at this point.  Patient is hypotensive   DVT prophylaxis: Lovenox Code Status: Full code Family Communication: Wife at bedside Disposition Plan: Home Consults called: None but may need orthopedic consult in the morning Admission status: Inpatient to stepdown  Severity of Illness: The appropriate patient status for this patient is INPATIENT. Inpatient status is judged to be reasonable and necessary in order to provide the required intensity of service to ensure the patient's safety. The patient's presenting symptoms, physical exam findings, and initial radiographic and laboratory data in the context of their chronic  comorbidities is felt to place them at high risk for further clinical deterioration. Furthermore, it is not anticipated that the patient will be medically stable for discharge from the hospital within 2 midnights of admission. The following factors support the patient status of inpatient.   " The patient's presenting symptoms include right lower extremity swelling and pain. " The worrisome physical exam findings include right swollen right lower extremity. " The initial radiographic and laboratory data are worrisome because of evidence of sepsis. " The chronic co-morbidities include peripheral neuropathy and foot ulcers.   * I certify that at the point of admission it is my clinical judgment that the patient will require inpatient hospital care spanning beyond 2 midnights from the point of admission due to high intensity of service, high risk for further deterioration and high frequency of surveillance required.Barbette Merino MD [facility] Pager [phone]  If 7PM-7AM, please contact night-coverage [email or web address] Password [email or web address]  08/14/2020, 1:01 AM

## 2020-08-14 NOTE — Progress Notes (Signed)
Subjective: 79 year old male presents the office today with his wife for follow-up evaluation of wound to the right fourth toe.  Overall states that the toe is doing better but still somewhat red and swollen.  Denies any drainage or pus or any red streaks. Was originally on doxycycline then added ciprofloxacin due to wound culture. Otherwise he feels well denies any systemic concerns including fevers, chills, nausea, vomiting.  No calf pain, chest pain, shortness of breath.  Objective: AAO x3, NAD DP/PT pulses palpable bilaterally, CRT less than 3 seconds Hammertoe contractures present resulting in a hyperkeratotic lesion with granular wound at the distal aspect of right fourth toe.  There is still localized edema and erythema to the toe but there is no ascending cellulitis.  There is no fluctuation or crepitation.  There is no malodor. Hammertoe contracture present..  Secondary rotation of the right foot. No pain with calf compression, swelling, warmth, erythema  Assessment: Ulceration right fourth toe, osteomyelitis  Plan: -All treatment options discussed with the patient including all alternatives, risks, complications.  -Debrided the wounds or any complications or bleeding.  Repeat x-rays were obtained and there is lucency of the distal phalanx consistent with osteomyelitis.  Appears to be localized and overall it appears to be stable.  Apparently amputation as an outpatient.  We discussed pros and cons of limb salvage versus amputation and elects to proceed with amputation. -The incision placement as well as the postoperative course was discussed with the patient. I discussed risks of the surgery which include, but not limited to, infection, bleeding, pain, swelling, need for further surgery, delayed or nonhealing, painful or ugly scar, numbness or sensation changes, over/under correction, recurrence, transfer lesions, further deformity, DVT/PE, loss of toe/foot. Patient understands these risks  and wishes to proceed with surgery. The surgical consent was reviewed with the patient all 3 pages were signed. No promises or guarantees were given to the outcome of the procedure. All questions were answered to the best of my ability. Before the surgery the patient was encouraged to call the office if there is any further questions. The surgery will be performed at [facility] on an outpatient basis. -Patient encouraged to call the office with any questions, concerns, change in symptoms.   Trula Slade DPM

## 2020-08-14 NOTE — ED Notes (Signed)
As I write this, he is sleeping soundly.

## 2020-08-14 NOTE — ED Notes (Signed)
I have just called report to [place], RN on [place]. Will transport shortly.

## 2020-08-14 NOTE — Progress Notes (Signed)
PROGRESS NOTE   [name].  EHU:[id number] DOB: [birth year] DOA: 08/13/2020 PCP: [name], MD  Brief Narrative:  79 home dwelling white male Paroxysmal A. 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 Jannifer Franklin neurology history of [name]  Seen recently at podiatry office 08/10/2020 for right fourth toe wound which was red and swollen-had a hyperkeratotic lesion with granular wound and was recommended by Dr. 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 Sepsis secondary to cellulitis potentially underlying abscess a. Procalcitonin elevated although lactic acid is normal-x-ray confirmed osteomyelitis of fourth toe b. Continuing ceftriaxone, vancomycin c. Hypotension earlier today likely secondary to impending septic shock has fortunately been diverted by high-dose IV fluids and boluses given earlier-continue LR 150 cc/h but place time parameter ending in 12 hours d. Night shift to reevaluate the need for further ongoing fluids e. Get ESR in the morning although will not change the need for amputation f. Anticipate 2-week long course of antibiotics postoperatively but if a surgical cure with clean margins, may be able to get away with less--Will defer with appreciation to podiatry Dr. Earleen Newport who is seeing the patient in  consult g. Area of skin over right lower extremity marked out with skin marking pencil to compare with on follow-up tomorrow h. Patient appropriate for progressive bed will change assignment 2. Complicated cardiac history with pacemaker-followed by Dr. Lovena Le, a. Sick sinus syndrome CHADS2 score >4 with PPM--not an anticoagulation candidate secondary to spontaneous subdural hematoma in the past i. Very cautiously have resumed Toprol-XL 12.5 given tenuous blood pressures on admission b. CAD c. Right-sided heart failure  i. cannot give at this time torsemide givenTenuous pressures earlier this morning-by the same token have to be very careful to not overload him ii. As an outpatient or later on this admission can reevaluate the same 3. OSA a. We will place on auto titration device b. Monitor overnight carefully 4. Hypokalemia 5. AKI on admission a. Mild-replace orally as he is awake and alert b. Getting fluids at this time-note he is on torsemide chronically 6. History [name] a. He is probably not mounting a very good immune response secondary to MUGS 7. Fatty liver cirrhosis + splenomegaly a. Followed in the outpatient setting by Dr. Simeon Craft such b. Bilirubin is slightly up at 1.6 when he was admitted LFTs are inconsequential c. Follow labs and INR in the morning 8. Anemia of multifactorial causes a. Outpatient work-up and follow-up b. Saturation ratios are low will need repeat in about 3 weeks and consideration for replacement if iron levels are low at that time  DVT prophylaxis: Lovenox Code Status: Confirmed full code at the bedside Family Communication: None-called and left message on cell phone with wife [phone] disposition: Inpatient  Status is: Inpatient  Remains inpatient appropriate because:Hemodynamically unstable, Persistent severe electrolyte disturbances and Ongoing diagnostic testing needed not appropriate for outpatient work up  Dispo: The patient is from: Home               Anticipated d/c is to: SNF              Anticipated d/c date is: > 3 days              Patient currently is not medically stable to d/c.   Consultants:   Dr. Earleen Newport of podiatry  Procedures: X-rays  Antimicrobials: Cefepime vancomycin   Subjective: Awake alert coherent in some distress and right lower extremity is significantly more swollen than left with pain and erythema as per pictures taken earlier this morning I marked out the area He does not seem to be significantly short of breath however his [name] is not working properly and despite being comfortable appearing it is monitoring in the 80s He is quite tachycardic however  Objective: Vitals:   08/14/20 0600 08/14/20 0624 08/14/20 0630 08/14/20 0703  BP: 99/68 95/61 103/68 (!) 96/59  Pulse: (!) 115 (!) 114 (!) 116 (!) 114  Resp: 17 18 (!) 27 16  Temp:      TempSrc:      SpO2: 100% 95% 94% 97%    Intake/Output Summary (Last 24 hours) at 08/14/2020 0803 Last data filed at 08/14/2020 0428 Gross per 24 hour  Intake --  Output 300 ml  Net -300 ml   There were no vitals filed for this visit.  Examination:  General exam: Awake alert coherent no distress Respiratory system: Clinically clear no added sound no rales no rhonchi Cardiovascular system: S1-S2 tachycardic no murmur rub or gallop Gastrointestinal system: Soft no rebound no guarding. Central nervous system: Neurologically intact moving all 4 limbs Extremities: Significant swelling of right lower extremity compared to left See pictures from Dr. Pasty Arch note-erythema marked out He is tender posterolaterally in the cough I do not appreciate a cord Skin: As above Psychiatry: Euthymic congruent  Data Reviewed: I have personally reviewed following labs and imaging studies Potassium 3.1 BUN/creatinine down from 30/1.8-30/1.59 (close to baseline) Bilirubin 1.3 INR 1.6 Lactic acid 1.5-->1.0, procalcitonin 4.7 White count 7.1 hemoglobin 9.7 Platelet  75  Radiology Studies: DG Foot Complete Right  Result Date: 08/13/2020 Please see detailed radiograph report in office note.    Scheduled Meds: . allopurinol  300 mg Oral Daily  . enoxaparin (LOVENOX) injection  40 mg Subcutaneous Q24H  . ferrous sulfate  325 mg Oral Q breakfast  . mirabegron ER  50 mg Oral Daily  . modafinil  50 mg Oral Daily  . multivitamin with minerals  1 tablet Oral Daily  . pantoprazole  40 mg Oral Daily  . rosuvastatin  40 mg Oral Daily  . sertraline  200 mg Oral Daily  . tamsulosin  0.4 mg Oral QHS   Continuous Infusions: . cefTRIAXone (ROCEPHIN)  IV Stopped (08/14/20 0257)  . lactated ringers 150 mL/hr at 08/14/20 0142  . vancomycin       LOS: 1 day    Time spent: [place], MD [facility] To contact the attending provider between 7A-7P or the covering provider during after hours 7P-7A, please log into the web site [email or web address] and access using universal [employer] password for that web site. If you do not have the password, please call the hospital operator.  08/14/2020, 8:03 AM

## 2020-08-14 NOTE — Progress Notes (Signed)
PHARMACIST - PHYSICIAN ORDER COMMUNICATION  CONCERNING: PTA medication  DESCRIPTION:  This patient's order for:  Modafinil 50mg  qd  has been noted. The smallest dose Modafinil tablet stocked is 200mg , unable to provide dose ordered   ACTION TAKEN: Modafinil order discontinued  If patient strongly desires medication given while admitted, patient could supply, note that this is a controlled substance.  Thank you,  Minda Ditto PharmD 08/14/2020, 6:01 PM

## 2020-08-14 NOTE — Consult Note (Signed)
Reason for Consult: Cellulitis/osteomyelitis Referring Physician: Dr. Nita Sells, MD  Jamie Di Kindle. is an 79 y.o. male.  HPI: 79 year old male brought to the emergency department yesterday by his wife for concerns of vomiting, body aches and chills.  Upon admission to the emergency department he was found to be hypertensive, tachycardic and found to be septic.  Lactic acid level 1 and white blood cell count 8.5.  This morning the patient states that he feels well.  He states that he noticed increased redness over the last couple days to his leg.  Past Medical History:  Diagnosis Date  . (HFpEF) heart failure with preserved ejection fraction ([place])    a. 05/2013 Echo: EF 55%, mild LVH, diast dysfxn, Ao sclerosis, mildly dil LA, RV dysfxn (poorly visualized), PASP 82mmHg;  b. 03/2017 Echo: EF 55-60%, no rwma, triv MR, mildly dil RV, mod TR, PASP 29mmHg.  . Atrial fibrillation [facility])    s/p Cox Maze 1/09;  Multaq Rx d/c'd in 2014 due to pulmo fibrosis;  coumadin d/c'd in 2014 due to spontaneous subdural hematoma  . BPH (benign prostatic hyperplasia)   . CAD (coronary artery disease), native coronary artery    a. s/p CABG 12/2007;  b. Myoview 12/2011: EF 66%, no scar or ischemia; normal.  . DM (diabetes mellitus) ([place])   . Hyperlipidemia type II   . Hypertension   . 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 OSA (obstructive sleep apnea)   . Pacemaker    PPM - St. Jude  . Peripheral neuropathy 07/31/2018  . Pulmonary fibrosis ([place])    Multaq d/c'd 7/14  . Subdural hematoma ([place]) 07/2012   spontaneous;  coumadin d/c'd => no longer a candidate for anticoagulation    Past Surgical History:  Procedure Laterality Date  . AMPUTATION Left 05/17/2019   Procedure: AMPUTATION LEFT FOURTH TOE;  Surgeon: Meredith Pel, MD;  Location: [place];  Service: Orthopedics;  Laterality: Left;  . AMPUTATION TOE Right 02/06/2020   Procedure: AMPUTATION TOE;  Surgeon:  Newt Minion, MD;  Location: [place];  Service: Orthopedics;  Laterality: Right;  . APPENDECTOMY    . CHOLECYSTECTOMY    . CORONARY ARTERY BYPASS GRAFT     x3 (left internal mammary artery to distal left anterior descending coronary artery, saphenous vain graft to second circumflex marginal branch, saphenous vain graft to posterior descending coronary artery, endoscopic saphenous vain harvest from right thigh) and modified Cox - Maze IV procedure.  [name],MD. Electronically signed CHO/MEDQ D: 01/09/2008/ JOB: [id number] cc:  [name]  . [name]  07/30/2012   Procedure: CRANIOTOMY HEMATOMA EVACUATION SUBDURAL;  Surgeon: [name], MD;  Location: [place] NEURO ORS;  Service: Neurosurgery;  Laterality: Right;  Right craniotomy for evacuation of subdural hematoma  . FOOT SURGERY    . HERNIA REPAIR    . INTRAMEDULLARY (IM) NAIL INTERTROCHANTERIC Right 02/04/2020   Procedure: INTRAMEDULLARY (IM) NAIL INTERTROCHANTRIC;  Surgeon: Netta Cedars, MD;  Location: [place];  Service: Orthopedics;  Laterality: Right;  . ORCHIECTOMY     Left  /  testicular cancer  . PACEMAKER PLACEMENT     PPM - St. Jude  . PPM GENERATOR CHANGEOUT N/A 06/25/2019   Procedure: PPM GENERATOR CHANGEOUT;  Surgeon: Evans Lance, MD;  Location: [place] CV LAB;  Service: Cardiovascular;  Laterality: N/A;    Family History  Problem Relation Age of Onset  . Heart disease Father   .  Heart attack Father   . Heart failure Father   . Heart disease Mother   . Alzheimer's disease Mother   . Dementia Mother     Social History:  reports that he quit smoking about 29 years ago. His smoking use included cigarettes. He has a 40.00 pack-year smoking history. He has never used smokeless tobacco. He reports that he does not drink alcohol and does not use drugs.  Allergies:  Allergies  Allergen Reactions  . Aricept [Donepezil] Other (See Comments)    Hallucination   . Lipitor [Atorvastatin] Other (See Comments)     Stiff joints  . 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 Warfarin And Related Other (See Comments)    Stiff joints  . Enbrel [Etanercept] Itching    Medications: I have reviewed the patient's current medications.  Results for orders placed or performed during the hospital encounter of 08/13/20 (from the past 48 hour(s))  Lactic acid, plasma     Status: None   Collection Time: 08/13/20  1:14 PM  Result Value Ref Range   Lactic Acid, Venous 1.5 0.5 - 1.9 mmol/L    Comment: Performed at [facility], [place] [address], [place], [place] [address]  Comprehensive metabolic panel     Status: Abnormal   Collection Time: 08/13/20  1:14 PM  Result Value Ref Range   Sodium 141 135 - 145 mmol/L   Potassium 4.0 3.5 - 5.1 mmol/L   Chloride 102 98 - 111 mmol/L   CO2 28 22 - 32 mmol/L   Glucose, Bld 126 (H) 70 - 99 mg/dL    Comment: Glucose reference range applies only to samples taken after fasting for at least 8 hours.   BUN 30 (H) 8 - 23 mg/dL   Creatinine, Ser 1.86 (H) 0.61 - 1.24 mg/dL   Calcium 9.1 8.9 - 10.3 mg/dL   Total Protein 8.4 (H) 6.5 - 8.1 g/dL   Albumin 4.3 3.5 - 5.0 g/dL   AST 45 (H) 15 - 41 U/L   ALT 32 0 - 44 U/L   Alkaline Phosphatase 89 38 - 126 U/L   Total Bilirubin 1.6 (H) 0.3 - 1.2 mg/dL   GFR calc non Af Amer 34 (L) >60 mL/min   GFR calc Af Amer 40 (L) >60 mL/min   Anion gap 11 5 - 15    Comment: Performed at [facility], [place] [address], [place], [place] [address]  CBC with Differential     Status: Abnormal   Collection Time: 08/13/20  1:14 PM  Result Value Ref Range   WBC 8.5 4.0 - 10.5 K/uL   RBC 3.75 (L) 4.22 - 5.81 MIL/uL   Hemoglobin 11.5 (L) 13.0 - 17.0 g/dL   HCT 35.1 (L) 39 - 52 %   MCV 93.6 80.0 - 100.0 fL   MCH 30.7 26.0 - 34.0 pg   MCHC 32.8 30.0 - 36.0 g/dL   RDW 15.0 11.5 - 15.5 %   Platelets 96 (L) 150 - 400 K/uL    Comment: Immature Platelet Fraction may be clinically indicated,  consider ordering this additional test [id number] REPEATED TO VERIFY SPECIMEN CHECKED FOR CLOTS PLATELET COUNT CONFIRMED BY SMEAR    nRBC 0.0 0.0 - 0.2 %   Neutrophils Relative % 89 %   Neutro Abs 7.6 1.7 - 7.7 K/uL   Lymphocytes Relative 7 %   Lymphs Abs 0.6 (L) 0.7 - 4.0 K/uL  Monocytes Relative 3 %   Monocytes Absolute 0.3 0 - 1 K/uL   Eosinophils Relative 0 %   Eosinophils Absolute 0.0 0 - 0 K/uL   Basophils Relative 0 %   Basophils Absolute 0.0 0 - 0 K/uL   Immature Granulocytes 1 %   Abs Immature Granulocytes 0.06 0.00 - 0.07 K/uL    Comment: Performed at [facility], [place] [address] [place], [place] [address]  SARS Coronavirus 2 by RT PCR (hospital order, performed in [facility] hospital lab) Nasopharyngeal Nasopharyngeal Swab     Status: None   Collection Time: 08/13/20  8:12 PM   Specimen: Nasopharyngeal Swab  Result Value Ref Range   SARS Coronavirus 2 NEGATIVE NEGATIVE    Comment: (NOTE) SARS-CoV-2 target nucleic acids are NOT DETECTED.  The SARS-CoV-2 RNA is generally detectable in upper and lower respiratory specimens during the acute phase of infection. The lowest concentration of SARS-CoV-2 viral copies this assay can detect is 250 copies / mL. A negative result does not preclude SARS-CoV-2 infection and should not be used as the sole basis for treatment or other patient management decisions.  A negative result may occur with improper specimen collection / handling, submission of specimen other than nasopharyngeal swab, presence of viral mutation(s) within the areas targeted by this assay, and inadequate number of viral copies (<250 copies / mL). A negative result must be combined with clinical observations, patient history, and epidemiological information.  Fact Sheet for Patients:   StrictlyIdeas.no  Fact Sheet for Healthcare Providers: BankingDealers.co.za  This test is not yet approved  or  cleared by the [place] FDA and has been authorized for detection and/or diagnosis of SARS-CoV-2 by FDA under an Emergency Use Authorization (EUA).  This EUA will remain in effect (meaning this test can be used) for the duration of the COVID-19 declaration under Section 564(b)(1) of the Act, 21 U.S.C. section 360bbb-3(b)(1), unless the authorization is terminated or revoked sooner.  Performed at [facility], [place] [address], [place], [place] [address]   Lactic acid, plasma     Status: None   Collection Time: 08/13/20  9:21 PM  Result Value Ref Range   Lactic Acid, Venous 1.0 0.5 - 1.9 mmol/L    Comment: Performed at [facility], [place] [address], [place], [place] [address]  Protime-INR     Status: Abnormal   Collection Time: 08/14/20  4:28 AM  Result Value Ref Range   Prothrombin Time 18.7 (H) 11.4 - 15.2 seconds   INR 1.6 (H) 0.8 - 1.2    Comment: (NOTE) INR goal varies based on device and disease states. Performed at [facility], [place] [address], [place], [place] [address]   Procalcitonin     Status: None   Collection Time: 08/14/20  4:28 AM  Result Value Ref Range   Procalcitonin 4.78 ng/mL    Comment:        Interpretation: PCT > 2 ng/mL: Systemic infection (sepsis) is likely, unless other causes are known. (NOTE)       Sepsis PCT Algorithm           Lower Respiratory Tract                                      Infection PCT Algorithm    ----------------------------     ----------------------------  PCT < 0.25 ng/mL                PCT < 0.10 ng/mL          Strongly encourage             Strongly discourage   discontinuation of antibiotics    initiation of antibiotics    ----------------------------     -----------------------------       PCT 0.25 - 0.50 ng/mL            PCT 0.10 - 0.25 ng/mL               OR       >80% decrease in PCT            Discourage initiation of                                             antibiotics      Encourage discontinuation           of antibiotics    ----------------------------     -----------------------------         PCT >= 0.50 ng/mL              PCT 0.26 - 0.50 ng/mL               AND       <80% decrease in PCT              Encourage initiation of                                             antibiotics       Encourage continuation           of antibiotics    ----------------------------     -----------------------------        PCT >= 0.50 ng/mL                  PCT > 0.50 ng/mL               AND         increase in PCT                  Strongly encourage                                      initiation of antibiotics    Strongly encourage escalation           of antibiotics                                     -----------------------------                                           PCT <= 0.25 ng/mL  OR                                        > 80% decrease in PCT                                      Discontinue / Do not initiate                                             antibiotics  Performed at [place] [address] [place], [place] [address]   Comprehensive metabolic panel     Status: Abnormal   Collection Time: 08/14/20  4:28 AM  Result Value Ref Range   Sodium 138 135 - 145 mmol/L   Potassium 3.1 (L) 3.5 - 5.1 mmol/L    Comment: DELTA CHECK NOTED   Chloride 101 98 - 111 mmol/L   CO2 26 22 - 32 mmol/L   Glucose, Bld 106 (H) 70 - 99 mg/dL    Comment: Glucose reference range applies only to samples taken after fasting for at least 8 hours.   BUN 30 (H) 8 - 23 mg/dL   Creatinine, Ser 1.59 (H) 0.61 - 1.24 mg/dL   Calcium 8.5 (L) 8.9 - 10.3 mg/dL   Total Protein 6.6 6.5 - 8.1 g/dL   Albumin 3.2 (L) 3.5 - 5.0 g/dL   AST 41 15 - 41 U/L   ALT 27 0 - 44 U/L   Alkaline Phosphatase 61 38 - 126 U/L   Total Bilirubin 1.3 (H) 0.3 - 1.2 mg/dL   GFR calc non Af Amer  41 (L) >60 mL/min   GFR calc Af Amer 48 (L) >60 mL/min   Anion gap 11 5 - 15    Comment: Performed at [facility], [place] [address] [place], [place] [address]  CBC     Status: Abnormal   Collection Time: 08/14/20  4:28 AM  Result Value Ref Range   WBC 7.1 4.0 - 10.5 K/uL   RBC 3.09 (L) 4.22 - 5.81 MIL/uL   Hemoglobin 9.7 (L) 13.0 - 17.0 g/dL   HCT 29.3 (L) 39 - 52 %   MCV 94.8 80.0 - 100.0 fL   MCH 31.4 26.0 - 34.0 pg   MCHC 33.1 30.0 - 36.0 g/dL   RDW 15.0 11.5 - 15.5 %   Platelets 75 (L) 150 - 400 K/uL    Comment: REPEATED TO VERIFY PLATELET COUNT CONFIRMED BY SMEAR SPECIMEN CHECKED FOR CLOTS Immature Platelet Fraction may be clinically indicated, consider ordering this additional test [id number]    nRBC 0.0 0.0 - 0.2 %    Comment: Performed at [facility], [place] [address], [place], [place] [address]    DG Foot Complete Right  Result Date: 08/13/2020 Please see detailed radiograph report in office note.   Review of Systems Blood pressure 115/72, pulse (!) 122, temperature (S) 100.3 F (37.9 C), temperature source (S) Rectal, resp. rate 16, SpO2 92 %. Physical Exam General:NAD-seen in the emergency department  Dermatological: Ulceration the distal aspect of right fourth toe with mild edema and erythema but there is no drainage or pus or any fluctuation or crepitation.  There is no malodor.  There is edema to the foot but there is extensive cellulitis from the ankle to the knee of the leg.  The erythema does not appear to be in the dorsal foot and almost a separate issue from the toe.  There is no open lesions on the leg and there is no areas of fluctuation.          Vascular: Difficult to palpate pulses in the right side today given swelling-previously has had palpable pulses  Neruologic: Sensation decreased  Musculoskeletal: Hammertoes present. Mild discomfort to the leg.   Assessment/Plan: Ulcer right 4th toe with  osteomyelitis; cellulitis/sepsis  X-rays of the foot and ankle ordered. For now continue IV antibiotics and monitor clinical improvement.  Will likely plan for surgery possibly on Monday for toe amputation.  Encourage elevation.  Recommend venous duplex. We will continue to follow. I updated the patients wife via phone this morning.   Trula Slade 08/14/2020, 9:12 AM

## 2020-08-15 LAB — CBC WITH DIFFERENTIAL/PLATELET
Abs Immature Granulocytes: 0.02 10*3/uL (ref 0.00–0.07)
Basophils Absolute: 0 10*3/uL (ref 0.0–0.1)
Basophils Relative: 0 %
Eosinophils Absolute: 0.1 10*3/uL (ref 0.0–0.5)
Eosinophils Relative: 2 %
HCT: 25.9 % — ABNORMAL LOW (ref 39.0–52.0)
Hemoglobin: 8.5 g/dL — ABNORMAL LOW (ref 13.0–17.0)
Immature Granulocytes: 0 %
Lymphocytes Relative: 12 %
Lymphs Abs: 0.5 10*3/uL — ABNORMAL LOW (ref 0.7–4.0)
MCH: 30.9 pg (ref 26.0–34.0)
MCHC: 32.8 g/dL (ref 30.0–36.0)
MCV: 94.2 fL (ref 80.0–100.0)
Monocytes Absolute: 0.2 10*3/uL (ref 0.1–1.0)
Monocytes Relative: 4 %
Neutro Abs: 3.7 10*3/uL (ref 1.7–7.7)
Neutrophils Relative %: 82 %
Platelets: 65 10*3/uL — ABNORMAL LOW (ref 150–400)
RBC: 2.75 MIL/uL — ABNORMAL LOW (ref 4.22–5.81)
RDW: 14.9 % (ref 11.5–15.5)
WBC: 4.5 10*3/uL (ref 4.0–10.5)
nRBC: 0 % (ref 0.0–0.2)

## 2020-08-15 LAB — COMPREHENSIVE METABOLIC PANEL
ALT: 24 U/L (ref 0–44)
AST: 39 U/L (ref 15–41)
Albumin: 2.9 g/dL — ABNORMAL LOW (ref 3.5–5.0)
Alkaline Phosphatase: 64 U/L (ref 38–126)
Anion gap: 6 (ref 5–15)
BUN: 24 mg/dL — ABNORMAL HIGH (ref 8–23)
CO2: 26 mmol/L (ref 22–32)
Calcium: 8.5 mg/dL — ABNORMAL LOW (ref 8.9–10.3)
Chloride: 104 mmol/L (ref 98–111)
Creatinine, Ser: 1.29 mg/dL — ABNORMAL HIGH (ref 0.61–1.24)
GFR calc Af Amer: >60 mL/min (ref >60)
GFR calc non Af Amer: 53 mL/min — ABNORMAL LOW (ref >60)
Glucose, Bld: 98 mg/dL (ref 70–99)
Potassium: 3.3 mmol/L — ABNORMAL LOW (ref 3.5–5.1)
Sodium: 136 mmol/L (ref 135–145)
Total Bilirubin: 0.9 mg/dL (ref 0.3–1.2)
Total Protein: 6.4 g/dL — ABNORMAL LOW (ref 6.5–8.1)

## 2020-08-15 LAB — PROTIME-INR
INR: 1.3 — ABNORMAL HIGH (ref 0.8–1.2)
Prothrombin Time: 15.8 seconds — ABNORMAL HIGH (ref 11.4–15.2)

## 2020-08-15 LAB — SEDIMENTATION RATE: Sed Rate: 88 mm/hr — ABNORMAL HIGH (ref 0–16)

## 2020-08-15 MED ORDER — METOPROLOL SUCCINATE ER 25 MG PO TB24
25.0000 mg | ORAL_TABLET | Freq: Every day | ORAL | Status: DC
  Administered 2020-08-16 – 2020-08-20 (×4): 25 mg via ORAL
  Filled 2020-08-15 (×4): qty 1

## 2020-08-15 MED ORDER — METOPROLOL SUCCINATE ER 25 MG PO TB24
12.5000 mg | ORAL_TABLET | Freq: Once | ORAL | Status: AC
  Administered 2020-08-15: 12.5 mg via ORAL
  Filled 2020-08-15: qty 1

## 2020-08-15 MED ORDER — VANCOMYCIN HCL 1250 MG/250ML IV SOLN
1250.0000 mg | INTRAVENOUS | Status: DC
  Administered 2020-08-15 – 2020-08-19 (×5): 1250 mg via INTRAVENOUS
  Filled 2020-08-15 (×6): qty 250

## 2020-08-15 NOTE — Progress Notes (Signed)
Podiatry progress note  Subjective: 79 year old male patient seen at bedside for evaluation of right leg cellulitis and right fourth toe ulcer.  Patient denies any overnight events or constitutional symptoms but does admit cellulitis advancing and staying around the knee.  Patient is assisted by wife at bedside.   Current Facility-Administered Medications:    allopurinol (ZYLOPRIM) tablet 300 mg, 300 mg, Oral, Daily, Verlon Au, Jai-Gurmukh, MD, 300 mg at 08/15/20 0932   cefTRIAXone (ROCEPHIN) 2 g in sodium chloride 0.9 % 100 mL IVPB, 2 g, Intravenous, Q24H, Samtani, Jai-Gurmukh, MD, Last Rate: 200 mL/hr at 08/15/20 0202, 2 g at 08/15/20 0202   enoxaparin (LOVENOX) injection 40 mg, 40 mg, Subcutaneous, Q24H, Samtani, Jai-Gurmukh, MD, 40 mg at 08/15/20 9629   ferrous sulfate tablet 325 mg, 325 mg, Oral, Q breakfast, Verlon Au, Jai-Gurmukh, MD, 325 mg at 08/15/20 0930   methocarbamol (ROBAXIN) tablet 500 mg, 500 mg, Oral, Q6H PRN, Nita Sells, MD, 500 mg at 08/14/20 1111   [START ON 08/16/2020] metoprolol succinate (TOPROL-XL) 24 hr tablet 25 mg, 25 mg, Oral, Daily, Samtani, Jai-Gurmukh, MD   mirabegron ER (MYRBETRIQ) tablet 50 mg, 50 mg, Oral, Daily, Verlon Au, Jai-Gurmukh, MD, 50 mg at 08/15/20 5284   multivitamin with minerals tablet 1 tablet, 1 tablet, Oral, Daily, Nita Sells, MD, 1 tablet at 08/15/20 0932   ondansetron (ZOFRAN) tablet 4 mg, 4 mg, Oral, Q6H PRN **OR** ondansetron (ZOFRAN) injection 4 mg, 4 mg, Intravenous, Q6H PRN, Verlon Au, Jai-Gurmukh, MD   pantoprazole (PROTONIX) EC tablet 40 mg, 40 mg, Oral, Daily, Samtani, Jai-Gurmukh, MD, 40 mg at 08/15/20 0930   rosuvastatin (CRESTOR) tablet 40 mg, 40 mg, Oral, Daily, Nita Sells, MD, 40 mg at 08/15/20 0931   sertraline (ZOLOFT) tablet 200 mg, 200 mg, Oral, Daily, Verlon Au, Jai-Gurmukh, MD, 200 mg at 08/15/20 0932   tamsulosin (FLOMAX) capsule 0.4 mg, 0.4 mg, Oral, QHS, Samtani, Jai-Gurmukh, MD, 0.4 mg at  08/14/20 2246   vancomycin (VANCOREADY) IVPB 1250 mg/250 mL, 1,250 mg, Intravenous, Q24H, Green, Terri L, RPH   Allergies  Allergen Reactions   Aricept [Donepezil] Other (See Comments)    Hallucination    Lipitor [Atorvastatin] Other (See Comments)    Stiff joints   Nsaids Other (See Comments)    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   Warfarin And Related Other (See Comments)    Stiff joints   Enbrel [Etanercept] Itching     Objective: Vitals with BMI 08/15/2020 08/15/2020 08/15/2020  Height - - -  Weight - - -  BMI - - -  Systolic 132 440 102  Diastolic 76 72 84  Pulse 725 112 116   Focused right lower extremity exam Dermatological: There is significant cellulitis and warmth to the level of the knee that is patchy in nature there is no warmth behind the knee but there is significant warmth to the calf with redness and significant trophic venous skin changes, there is a ulceration noted with a granular base to the distal tuft of the right fourth toe with no active drainage no purulence no malodor very minimal localized swelling to the right fourth toe.  Pedal pulses are decreased but palpable and capillary fill time intact to remaining toes of right foot.  Patient has a history of digital amputation.  No pain to palpation to foot.  Mild tenderness palpation to right calf.  Assessment and plan: Problem List Items Addressed This Visit    None    Visit Diagnoses    Cellulitis of right leg    -  Primary   Osteomyelitis of toe (HCC)       Relevant Medications   ceFEPIme (MAXIPIME) 2 g in sodium chloride 0.9 % 100 mL IVPB (Completed)   vancomycin (VANCOREADY) IVPB 1500 mg/300 mL (Completed)   vancomycin (VANCOCIN) IVPB 1000 mg/200 mL premix (Completed)   cefTRIAXone (ROCEPHIN) 2 g in sodium chloride 0.9 % 100 mL IVPB   vancomycin (VANCOREADY) IVPB 1250 mg/250 mL (Start on 08/15/2020  8:00 PM)   Other Relevant Orders   DG Foot Complete Right (Completed)   Cellulitis       Relevant  Orders   DG Ankle Complete Right (Completed)      Patient seen and evaluated Chart reviewed  Discussed case with Dr. Jacqualyn Posey Plan for possible OR on Tuesday for right fourth toe amputation Continue with IV antibiotics and medical optimization in preparation for OR Continue with rest and elevation of the right lower extremity Ordered ultrasound of right lower extremity to evaluate for DVT Patient may get out of bed to chair with assistance Podiatry to follow  [facility] [address]. Cannon Kettle [phone] office [phone] cell

## 2020-08-15 NOTE — Progress Notes (Signed)
Pharmacy Antibiotic Note  [name]. is a 79 y.o. male admitted on 08/13/2020 with c/o worsening right leg swelling and redness. Scheduled for toe amputation on same leg on 8/30.  Pharmacy has been consulted for vancomycin for cellulitis.  Plan: Vancomycin 1500mg  IV x 1, maintenance dose incr to 1250mg  q24 (AUC 477, Cr 1.29) Rocephin 2gm q24 Plan toe amputation 8/30  Height: 5\' 8"  (172.7 cm) Weight: 84.8 kg (187 lb) IBW/kg (Calculated) : 68.4  Temp (24hrs), Avg:98.7 F (37.1 C), Min:97.6 F (36.4 C), Max:99.8 F (37.7 C)  Recent Labs  Lab 08/13/20 1314 08/13/20 2121 08/14/20 0428 08/15/20 0618  WBC 8.5  --  7.1 4.5  CREATININE 1.86*  --  1.59* 1.29*  LATICACIDVEN 1.5 1.0  --   --     Estimated Creatinine Clearance: 50.9 mL/min (A) (by C-G formula based on SCr of 1.29 mg/dL (H)).    Allergies  Allergen Reactions  . Aricept [Donepezil] Other (See Comments)    Hallucination   . Lipitor [Atorvastatin] Other (See Comments)    Stiff joints  . 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 Warfarin And Related Other (See Comments)    Stiff joints  . Enbrel [Etanercept] Itching    Antimicrobials this admission: 8/27 vanc 8/27 cefepime x 1 8/27 Rocephin Dose adjustments this admission: Vanc 1gm q24 (459,Cr 1.59) > 1250mg  q24 (477, Cr 1.29)  Microbiology results: No current cx  Prev Cx 8/12 Wound Cx: Pseudomonas pan-sens  Thank you for allowing pharmacy to be a part of this patient's care.  Minda Ditto PharmD 08/15/2020, 11:37 AM

## 2020-08-15 NOTE — Progress Notes (Signed)
PROGRESS NOTE   Ravindra Di Kindle.  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 Jannifer Franklin neurology history of [name]  Seen recently at podiatry office 08/10/2020 for right fourth toe wound which was red and swollen-had a hyperkeratotic lesion with granular wound and was recommended by Dr. Jacqualyn Posey and amputation at that time  He presented with early septic shock to the hospital on 8/28 likely secondary to presumed cellulitis/osteomyelitis but was able to resuscitate aggressively in the emergency room and was transferred to the progressive care Podiatrist is seen and planning for surgery?   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 Sepsis + hypotension on admit with possible early septic shock secondary to fourth toe osteomyelitis and infection and cellulitis a. Procalcitonin elevated although lactic acid is normal-x-ray confirmed osteomyelitis of fourth toe b. Continuing ceftriaxone, vancomycin until definitive operative management Dr. Earleen Newport possibly 8/30 c. Area of skin over right lower extremity marked out with skin marking pencil to compare with on follow-up tomorrow 2. Complicated cardiac history with pacemaker-followed by Dr.  Lovena Le, a. Sick sinus syndrome CHADS2 score >4 with PPM--not an anticoagulation candidate secondary to spontaneous subdural hematoma in the past i. Increase Toprol-XL to 25 is not rate controlled on monitors b. CAD c. Right-sided heart failure  i. Gentle IV fluids at this time holding torsemide from prior to admission ii. -800 cc so far 3. OSA a. We will place on auto titration device b. Monitor overnight carefully 4. Hypokalemia 5. AKI on admission a. Improved b. Note he is on torsemide chronically 6. History [name] a. He is probably not mounting a very good immune response secondary to MUGS 7. Fatty liver cirrhosis + splenomegaly a. Followed in the outpatient setting by Dr. Simeon Craft such b. Bilirubin to be repeated in a.m. c. Repeat labs in a.m. d. He is dropping his platelet count and his hemoglobin slightly because of hemodilution I would not work-up further at this time unless platelets drop below 50 8. Anemia of multifactorial causes a. Outpatient work-up and follow-up b. Saturation ratios are low will need repeat in about 3 weeks and consideration for replacement if iron levels are low at that time  DVT prophylaxis: Lovenox Code Status: Confirmed full code at the bedside Family Communication: Met with wife at bedside her phone number is [phone] disposition: Inpatient  Status is: Inpatient  Remains inpatient appropriate because:Hemodynamically unstable, Persistent severe electrolyte disturbances and Ongoing diagnostic testing needed not appropriate for outpatient work up   Dispo: The patient is from: Home              Anticipated d/c is to: SNF              Anticipated d/c date is: > 3 days              Patient  currently is not medically stable to d/c.   Consultants:   Dr. Earleen Newport of podiatry  Procedures: X-rays  Antimicrobials: Cefepime vancomycin   Subjective: Well no distress sitting up in bed eating breakfast no chest pain No shortness of breath no fever no  chills Redness seems to be a little bit improved on lower extremity  Objective: Vitals:   08/15/20 0014 08/15/20 0449 08/15/20 0934 08/15/20 0934  BP: 114/72 101/69 127/76 127/84  Pulse: (!) 119 (!) 115 (!) 117 (!) 116  Resp: _0 Temp: 98.9 F (37.2 C) 97.6 F (36.4 C)  98.5 F (36.9 C)  TempSrc: Oral Oral  Oral  SpO2: 99% 96%  100%  Weight:      Height:        Intake/Output Summary (Last 24 hours) at 08/15/2020 1024 Last data filed at 08/15/2020 0940 Gross per 24 hour  Intake 1477.26 ml  Output 1750 ml  Net -272.74 ml   Filed Weights   08/14/20 1205  Weight: 84.8 kg    Examination:  General exam: Awake alert coherent no distress Respiratory system: Clinically clear no added  Cardiovascular system: S1-S2 tachycardic 120 range with A. fib Gastrointestinal system: Soft no rebound no guarding. Central nervous system: Neurologically intact moving all 4 limbs Extremities: Significant swelling of right lower extremity seems a little bit receded Skin: As above Psychiatry: Euthymic congruent  Data Reviewed: I have personally reviewed following labs and imaging studies Potassium 3.3 BUN/creatinine down from 30/1.8-30/1.59-->24/1.2 (close to baseline) Bilirubin 0.9 down from 1.3 Lactic acid 1.5-->1.0, procalcitonin 4.7 White count 7.1-->4.5 Hemoglobin 9.7-->8.5 Platelet 75-->65  Radiology Studies: DG Ankle Complete Right  Result Date: 08/14/2020 CLINICAL DATA:  Increased redness over the last couple days in the lower leg. EXAM: RIGHT ANKLE - COMPLETE 3+ VIEW COMPARISON:  None. FINDINGS: No fractures. Periostitis associated with the distal tibia, likely chronic. Diffuse soft tissue swelling identified. No other acute abnormalities. Degenerative changes in the midfoot. IMPRESSION: 1. Periostitis associated with the tibia is likely chronic. 2. Significant diffuse soft tissue swelling in the ankle. 3. No acute fractures.  No evidence of osteomyelitis. Electronically  Signed   By: Dorise Bullion III M.D   On: 08/14/2020 10:20   DG Foot Complete Right  Result Date: 08/14/2020 CLINICAL DATA:  Osteomyelitis of toe. Increased redness in the lower leg. EXAM: RIGHT FOOT COMPLETE - 3+ VIEW COMPARISON:  July 19, 2020 and August 10, 2020 FINDINGS: Erosive changes in the tuft of the fourth toe persist. Second toes been amputated. The toes are otherwise normal. Osteoarthritic change in the midfoot. No other sites of osteomyelitis identified. No fractures. IMPRESSION: 1. Findings worrisome for osteomyelitis in the tuft of the distal phalanx of the right fourth toe. 2. Midfoot osteoarthritis. Electronically Signed   By: Dorise Bullion III M.D   On: 08/14/2020 10:18   DG Foot Complete Right  Result Date: 08/13/2020 Please see detailed radiograph report in office note.    Scheduled Meds: . allopurinol  300 mg Oral Daily  . enoxaparin (LOVENOX) injection  40 mg Subcutaneous Q24H  . ferrous sulfate  325 mg Oral Q breakfast  . metoprolol succinate  12.5 mg Oral Daily  . mirabegron ER  50 mg Oral Daily  . multivitamin with minerals  1 tablet Oral Daily  . pantoprazole  40 mg Oral Daily  . rosuvastatin  40 mg Oral Daily  . sertraline  200 mg Oral Daily  . tamsulosin  0.4 mg Oral QHS  Continuous Infusions: . cefTRIAXone (ROCEPHIN)  IV 2 g (08/15/20 0202)  . vancomycin 1,000 mg (08/14/20 2246)     LOS: 2 days    Time spent: Riley, MD [facility] To contact the attending provider between 7A-7P or the covering provider during after hours 7P-7A, please log into the web site [email or web address] and access using universal [place] password for that web site. If you do not have the password, please call the hospital operator.  08/15/2020, 10:24 AM

## 2020-08-16 ENCOUNTER — Inpatient Hospital Stay (HOSPITAL_COMMUNITY): Payer: Medicare Other

## 2020-08-16 DIAGNOSIS — M86671 Other chronic osteomyelitis, right ankle and foot: Secondary | ICD-10-CM

## 2020-08-16 DIAGNOSIS — M7989 Other specified soft tissue disorders: Secondary | ICD-10-CM

## 2020-08-16 LAB — CBC WITH DIFFERENTIAL/PLATELET
Abs Immature Granulocytes: 0.02 10*3/uL (ref 0.00–0.07)
Basophils Absolute: 0 10*3/uL (ref 0.0–0.1)
Basophils Relative: 0 %
Eosinophils Absolute: 0.1 10*3/uL (ref 0.0–0.5)
Eosinophils Relative: 3 %
HCT: 26.9 % — ABNORMAL LOW (ref 39.0–52.0)
Hemoglobin: 8.8 g/dL — ABNORMAL LOW (ref 13.0–17.0)
Immature Granulocytes: 1 %
Lymphocytes Relative: 13 %
Lymphs Abs: 0.6 10*3/uL — ABNORMAL LOW (ref 0.7–4.0)
MCH: 31 pg (ref 26.0–34.0)
MCHC: 32.7 g/dL (ref 30.0–36.0)
MCV: 94.7 fL (ref 80.0–100.0)
Monocytes Absolute: 0.2 10*3/uL (ref 0.1–1.0)
Monocytes Relative: 5 %
Neutro Abs: 3.5 10*3/uL (ref 1.7–7.7)
Neutrophils Relative %: 78 %
Platelets: 79 10*3/uL — ABNORMAL LOW (ref 150–400)
RBC: 2.84 MIL/uL — ABNORMAL LOW (ref 4.22–5.81)
RDW: 14.8 % (ref 11.5–15.5)
WBC: 4.4 10*3/uL (ref 4.0–10.5)
nRBC: 0 % (ref 0.0–0.2)

## 2020-08-16 LAB — COMPREHENSIVE METABOLIC PANEL
ALT: 25 U/L (ref 0–44)
AST: 39 U/L (ref 15–41)
Albumin: 2.8 g/dL — ABNORMAL LOW (ref 3.5–5.0)
Alkaline Phosphatase: 74 U/L (ref 38–126)
Anion gap: 8 (ref 5–15)
BUN: 20 mg/dL (ref 8–23)
CO2: 23 mmol/L (ref 22–32)
Calcium: 8.4 mg/dL — ABNORMAL LOW (ref 8.9–10.3)
Chloride: 105 mmol/L (ref 98–111)
Creatinine, Ser: 1.19 mg/dL (ref 0.61–1.24)
GFR calc Af Amer: >60 mL/min (ref >60)
GFR calc non Af Amer: 59 mL/min — ABNORMAL LOW (ref >60)
Glucose, Bld: 93 mg/dL (ref 70–99)
Potassium: 3.5 mmol/L (ref 3.5–5.1)
Sodium: 136 mmol/L (ref 135–145)
Total Bilirubin: 0.7 mg/dL (ref 0.3–1.2)
Total Protein: 6.4 g/dL — ABNORMAL LOW (ref 6.5–8.1)

## 2020-08-16 NOTE — Progress Notes (Signed)
Subjective: 79 year old male was admitted to the hospital with severe sepsis due to right lower extremity cellulitis.  X-rays showed osteomyelitis.  Has been doing the patient for the wound and he was scheduled for outpatient surgery on August 18, 2020.  They states that he is feeling better and denies any fevers or chills.  No significant pain in the leg and he feels that the leg is improving.  Objective: NAD DP/PT pulses palpable bilaterally, CRT less than 3 seconds Ulceration the distal aspect the right fourth toe with granular wound base without any probing to bone, undermining or tunneling there is mild erythema and edema to the distal aspect of toe which is been chronic.  The foot without significant erythema or warmth.  Erythema extending from the ankle to distal knee with warmth of the leg however clinically it appears to be improved compared to the when he was admitted.  There is no areas of fluctuation or crepitation to the leg.  Venous insufficiency changes are noted bilaterally. No pain with calf compression, swelling, warmth, erythema  Assessment: Osteomyelitis right fourth toe, cellulitis with improving sepsis  Plan: He is clinically improving on IV antibiotics however given osteomyelitis we discussed amputation.  We will plan on doing right fourth toe amputation tomorrow.  We discussed the surgery as well as risks and alternatives to surgery.  We discussed risks of surgery including, but not limited to, spread of infection, further amputation, need for further surgery, bleeding, stroke, heart attack, death.  He wishes to proceed with surgery tomorrow and I also updated the patient's wife via phone and is in agreement to this.  N.p.o. after midnight.  Surgical consent to be signed.  Celesta Gentile, DPM O: [phone] C: [phone]

## 2020-08-16 NOTE — Progress Notes (Signed)
PROGRESS NOTE   [name].  IOM:[id number] DOB: 01/30/1941 DOA: 08/13/2020 PCP: [name], MD  Brief Narrative:  79 home dwelling white male Paroxysmal A. 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 Jannifer Franklin neurology history of [name]  Seen recently at podiatry office 08/10/2020 for right fourth toe wound which was red and swollen-had a hyperkeratotic lesion with granular wound and was recommended by Dr. Jacqualyn Posey and amputation at that time  He presented with early septic shock to the hospital on 8/28 likely secondary to presumed cellulitis/osteomyelitis but was able to resuscitate aggressively in the emergency room and was transferred to the progressive care Podiatrist is seen and planning for surgery?   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 Sepsis + hypotension on admit with possible early septic shock secondary to fourth toe osteomyelitis and infection and cellulitis a. Procalcitonin elevated although lactic acid is normal-x-ray confirmed osteomyelitis of fourth toe b. Continuing ceftriaxone, vancomycin until definitive operative management per podiatry 2. Complicated cardiac history with pacemaker-followed by Dr. Lovena Le, a. Sick sinus syndrome CHADS2 score >4 with PPM--not an anticoagulation candidate secondary to spontaneous subdural hematoma in  the past i. Increase Toprol-XL to 25 is not rate controlled on monitors ii. Have discontinued monitors today 8/30 b. CAD c. Right-sided heart failure  i. Gentle IV fluids at this time holding torsemide from prior to admission ii. -2.5 liters 3. Linezolid OSA a. We will place on auto titration device b. Monitor overnight carefully 4. Hypokalemia 5. AKI on admission a. Improved b. Note he is on torsemide chronically 6. History [name] a. He is probably not mounting a very good immune response secondary to MUGS 7. Fatty liver cirrhosis + splenomegaly a. Followed in the outpatient setting by Dr. Simeon Craft such b. No new changes today 8. Anemia of multifactorial causes a. Outpatient work-up and follow-up b. Saturation ratios are low will need repeat in about 3 weeks and consideration for replacement if iron levels are low at that time  DVT prophylaxis: Lovenox Code Status: Confirmed full code at the bedside Family Communication: No family present today phone number is [phone] disposition: Inpatient  Status is: Inpatient  Remains inpatient appropriate because:Hemodynamically unstable, Persistent severe electrolyte disturbances and Ongoing diagnostic testing needed not appropriate for outpatient work up   Dispo: The patient is from: Home              Anticipated d/c is to: SNF              Anticipated d/c date is: > 3 days              Patient currently is not medically stable to d/c.   Consultants:   Dr. Earleen Newport of podiatry  Procedures: X-rays  Antimicrobials: Cefepime vancomycin   Subjective: Sleeping comfortably in bed no distress  Objective: Vitals:   08/15/20 2030 08/16/20 0014 08/16/20 0555 08/16/20 1002  BP: 118/82 112/74 121/80 118/78  Pulse: [name])  112 (!) 113 (!) 113 (!) 108  Resp: 18 16 18 19   Temp: 98.2 F (36.8 C) 98.6 F (37 C) 98.4 F (36.9 C) 98.6 F (37 C)  TempSrc: Oral Oral Oral Oral  SpO2: 99% 97% 94% 95%  Weight:      Height:        Intake/Output  Summary (Last 24 hours) at 08/16/2020 1211 Last data filed at 08/16/2020 1025 Gross per 24 hour  Intake 100 ml  Output 1800 ml  Net -1700 ml   Filed Weights   08/14/20 1205  Weight: 84.8 kg    Examination:   NCAT no focal deficit Abdomen soft nontender no rebound no guarding Chest clear no added sound Abdomen soft Wounds seen about the same redness about the same and lower extremity  Data Reviewed: I have personally reviewed following labs and imaging studies Potassium 3.5 BUN/creatinine 24/1.2-20/1.1 WBC 4.4 Hemoglobin 8.8 Platelets 79 Bilirubin 0.7 LFTs normal  Radiology Studies: VAS [place] LOWER EXTREMITY VENOUS (DVT)  Result Date: 08/16/2020  Lower Venous DVTStudy Indications: Swelling.  Risk Factors: None identified. Limitations: Poor ultrasound/tissue interface. Comparison Study: No prior studies. Performing Technologist: Oliver Hum RVT  Examination Guidelines: A complete evaluation includes B-mode imaging, spectral Doppler, color Doppler, and power Doppler as needed of all accessible portions of each vessel. Bilateral testing is considered an integral part of a complete examination. Limited examinations for reoccurring indications may be performed as noted. The reflux portion of the exam is performed with the patient in reverse Trendelenburg.  +---------+---------------+---------+-----------+----------+--------------+ RIGHT    CompressibilityPhasicitySpontaneityPropertiesThrombus Aging +---------+---------------+---------+-----------+----------+--------------+ CFV      Full           Yes      Yes                                 +---------+---------------+---------+-----------+----------+--------------+ SFJ      Full                                                        +---------+---------------+---------+-----------+----------+--------------+ FV Prox  Full                                                         +---------+---------------+---------+-----------+----------+--------------+ FV Mid   Full                                                        +---------+---------------+---------+-----------+----------+--------------+ FV DistalFull                                                        +---------+---------------+---------+-----------+----------+--------------+ PFV      Full                                                        +---------+---------------+---------+-----------+----------+--------------+  POP      Full           Yes      Yes                                 +---------+---------------+---------+-----------+----------+--------------+ PTV      Full                                                        +---------+---------------+---------+-----------+----------+--------------+ PERO     Full                                                        +---------+---------------+---------+-----------+----------+--------------+   +----+---------------+---------+-----------+----------+--------------+ LEFTCompressibilityPhasicitySpontaneityPropertiesThrombus Aging +----+---------------+---------+-----------+----------+--------------+ CFV Full           Yes      Yes                                 +----+---------------+---------+-----------+----------+--------------+     Summary: RIGHT: - There is no evidence of deep vein thrombosis in the lower extremity.  - No cystic structure found in the popliteal fossa.  LEFT: - No evidence of common femoral vein obstruction.  *See table(s) above for measurements and observations.    Preliminary      Scheduled Meds: . allopurinol  300 mg Oral Daily  . enoxaparin (LOVENOX) injection  40 mg Subcutaneous Q24H  . ferrous sulfate  325 mg Oral Q breakfast  . metoprolol succinate  25 mg Oral Daily  . mirabegron ER  50 mg Oral Daily  . multivitamin with minerals  1 tablet Oral Daily  . pantoprazole  40 mg Oral Daily    . rosuvastatin  40 mg Oral Daily  . sertraline  200 mg Oral Daily  . tamsulosin  0.4 mg Oral QHS   Continuous Infusions: . cefTRIAXone (ROCEPHIN)  IV 2 g (08/16/20 0155)  . vancomycin 1,250 mg (08/15/20 2147)     LOS: 3 days    Time spent: Sherman, MD [facility] To contact the attending provider between 7A-7P or the covering provider during after hours 7P-7A, please log into the web site [email or web address] and access using universal [place] password for that web site. If you do not have the password, please call the hospital operator.  08/16/2020, 12:11 PM

## 2020-08-16 NOTE — Care Management Important Message (Signed)
Important Message  Patient Details IM Letter given to the Patient Name: [name]. MRN: [id number] Date of Birth: Sep 30, 1941   Medicare Important Message Given:  Yes     Kerin Salen 08/16/2020, 9:31 AM

## 2020-08-16 NOTE — Progress Notes (Signed)
Right lower extremity venous duplex has been completed. Preliminary results can be found in CV Proc through chart review.   08/16/20 9:26 AM [name] RVT

## 2020-08-16 NOTE — TOC Initial Note (Signed)
Transition of Care (TOC) - Initial/Assessment Note    Patient Details  Name: [name]. MRN: [id number] Date of Birth: 1941/01/11  Transition of Care [facility]) CM/SW Contact:    Shade Flood, LCSW Phone Number: 08/16/2020, 10:27 AM  Clinical Narrative:                  Pt admitted from home. He lives with his wife. Pt scheduled for toe amputation and PT eval after that. MD anticipating pt may need SNF at dc.   TOC will follow up after PT eval to assist with recommended post hospital care.  Expected Discharge Plan: [place] Barriers to Discharge: Continued Medical Work up   Patient Goals and CMS Choice        Expected Discharge Plan and Services Expected Discharge Plan: [place] In-house Referral: Clinical Social Work     Living arrangements for the past 2 months: [place]                                      Prior Living Arrangements/Services Living arrangements for the past 2 months: Single Family Home Lives with:: Spouse Patient language and need for interpreter reviewed:: Yes Do you feel safe going back to the place where you live?: Yes      Need for Family Participation in Patient Care: Yes (Comment) Care giver support system in place?: Yes (comment)   Criminal Activity/Legal Involvement Pertinent to Current Situation/Hospitalization: No - Comment as needed  Activities of Daily Living Home Assistive Devices/Equipment: Built-in shower seat, Bedside commode/3-in-1, CPAP, Eyeglasses, Grab bars in shower, Walker (specify type) ADL Screening (condition at time of admission) Patient's cognitive ability adequate to safely complete daily activities?: Yes Is the patient deaf or have difficulty hearing?: Yes Does the patient have difficulty seeing, even when wearing glasses/contacts?: No Does the patient have difficulty concentrating, remembering, or making decisions?: Yes Patient able to express need for  assistance with ADLs?: Yes Does the patient have difficulty dressing or bathing?: Yes Independently performs ADLs?: Yes (appropriate for developmental age) Does the patient have difficulty walking or climbing stairs?: Yes Weakness of Legs: Both Weakness of Arms/Hands: None  Permission Sought/Granted                  Emotional Assessment       Orientation: : Oriented to Self, Oriented to Place, Oriented to  Time, Oriented to Situation Alcohol / Substance Use: Not Applicable Psych Involvement: No (comment)  Admission diagnosis:  Cellulitis [L03.90] Osteomyelitis of toe (HCC) [M86.9] Cellulitis of right leg [L03.115] Cellulitis of right lower leg [L03.115] Patient Active Problem List   Diagnosis Date Noted  . Severe sepsis ([place]) 08/14/2020  . Cellulitis of right lower leg 08/13/2020  . Pulmonary fibrosis ([place]) 08/05/2020  . OSA on CPAP 02/11/2020  . Persistent hypersomnia 02/11/2020  . Cognitive decline 02/11/2020  . Hip fracture ([place]) 02/09/2020  . Osteomyelitis of second toe of right foot ([place])   . Closed right hip fracture, initial encounter ([place]) 02/04/2020  . Closed intertrochanteric fracture of right hip, initial encounter ([place]) 02/04/2020  . Closed nondisplaced intertrochanteric fracture of right femur ([place])   . Symptomatic bradycardia 09/24/2019  . Chronic diastolic heart failure ([place]) 06/03/2019  . Coronary artery disease involving native heart without angina pectoris 06/03/2019  . Debility 05/26/2019  . 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  Toe infection 05/17/2019  . DOE (dyspnea on exertion) 05/15/2019  . Severe tricuspid regurgitation 05/15/2019  . Chronic right-sided CHF (congestive heart failure) ([place]) 05/15/2019  . Cellulitis of fourth toe of left foot   . Excessive daytime sleepiness 02/11/2019  . 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[place]) 02/11/2019  . Iron deficiency anemia 02/04/2019  . Poor memory 12/16/2018  .  Deficiency anemia 11/19/2018  . Other fatigue 11/19/2018  . History of elevated PSA 11/19/2018  . Prostate cancer screening 11/19/2018  . Prostate CA ([place]) 11/19/2018  . Pancytopenia, acquired ([place]) 08/13/2018  . Recurrent falls 08/13/2018  . 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 Peripheral neuropathy 07/31/2018  . 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 Anemia, chronic disease 01/28/2018  . History of skin cancer 07/27/2017  . Fatty liver disease, nonalcoholic 40/97/3532  . Thrombocytopenia ([place]) 11/27/2016  . Lung nodule 04/23/2014  . Postinflammatory pulmonary fibrosis ([place]) 06/13/2013  . Long term (current) use of anticoagulants 11/22/2011  . Abdominal bruit 03/07/2011  . Pacemaker-St.Jude   . Hx of CABG   . Hyperlipidemia type II   . OSA (obstructive sleep apnea)   . PACEMAKER, PERMANENT 08/17/2010  . HYPERLIPIDEMIA 08/16/2010  . Obesity 08/16/2010  . Essential hypertension 08/16/2010  . Atrial fibrillation ([place]) 08/16/2010  . IRREGULAR HEART RATE 08/16/2010  . Sleep apnea 08/16/2010  . BENIGN PROSTATIC HYPERTROPHY, HX OF 08/16/2010   PCP:  [name], MD Pharmacy:   [id number] - [place], [place] [address] [place] [address] Phone: [phone] Fax: [phone]     Social Determinants of Health (SDOH) Interventions    Readmission Risk Interventions Readmission Risk Prevention Plan 08/16/2020 05/26/2019  Transportation Screening Complete Complete  PCP or Specialist Appt within 3-5 Days - Complete  HRI or [place] - Complete  Social Work Consult for [place] Planning/Counseling - Complete  Palliative Care Screening Not Applicable Not Applicable  Medication Review Press photographer) Complete Complete  Some recent data might be hidden

## 2020-08-17 ENCOUNTER — Inpatient Hospital Stay (HOSPITAL_COMMUNITY): Payer: Medicare Other

## 2020-08-17 ENCOUNTER — Inpatient Hospital Stay (HOSPITAL_COMMUNITY): Payer: Medicare Other | Admitting: Certified Registered"

## 2020-08-17 ENCOUNTER — Encounter (HOSPITAL_COMMUNITY)
Admission: EM | Disposition: A | Discharge status: Home Health | Payer: Self-pay | Source: Home / Self Care | Attending: Family Medicine

## 2020-08-17 ENCOUNTER — Encounter (HOSPITAL_COMMUNITY): Payer: Self-pay | Admitting: Internal Medicine

## 2020-08-17 DIAGNOSIS — M86671 Other chronic osteomyelitis, right ankle and foot: Secondary | ICD-10-CM

## 2020-08-17 HISTORY — PX: AMPUTATION TOE: SHX6595

## 2020-08-17 LAB — COMPREHENSIVE METABOLIC PANEL
ALT: 30 U/L (ref 0–44)
AST: 49 U/L — ABNORMAL HIGH (ref 15–41)
Albumin: 2.9 g/dL — ABNORMAL LOW (ref 3.5–5.0)
Alkaline Phosphatase: 100 U/L (ref 38–126)
Anion gap: 9 (ref 5–15)
BUN: 18 mg/dL (ref 8–23)
CO2: 23 mmol/L (ref 22–32)
Calcium: 8.5 mg/dL — ABNORMAL LOW (ref 8.9–10.3)
Chloride: 103 mmol/L (ref 98–111)
Creatinine, Ser: 1.1 mg/dL (ref 0.61–1.24)
GFR calc Af Amer: >60 mL/min (ref >60)
GFR calc non Af Amer: >60 mL/min (ref >60)
Glucose, Bld: 91 mg/dL (ref 70–99)
Potassium: 3.5 mmol/L (ref 3.5–5.1)
Sodium: 135 mmol/L (ref 135–145)
Total Bilirubin: 0.8 mg/dL (ref 0.3–1.2)
Total Protein: 6.6 g/dL (ref 6.5–8.1)

## 2020-08-17 LAB — GLUCOSE, CAPILLARY: Glucose-Capillary: 90 mg/dL (ref 70–99)

## 2020-08-17 LAB — CBC WITH DIFFERENTIAL/PLATELET
Abs Immature Granulocytes: 0.03 10*3/uL (ref 0.00–0.07)
Basophils Absolute: 0 10*3/uL (ref 0.0–0.1)
Basophils Relative: 1 %
Eosinophils Absolute: 0.1 10*3/uL (ref 0.0–0.5)
Eosinophils Relative: 3 %
HCT: 26 % — ABNORMAL LOW (ref 39.0–52.0)
Hemoglobin: 8.5 g/dL — ABNORMAL LOW (ref 13.0–17.0)
Immature Granulocytes: 1 %
Lymphocytes Relative: 14 %
Lymphs Abs: 0.5 10*3/uL — ABNORMAL LOW (ref 0.7–4.0)
MCH: 31 pg (ref 26.0–34.0)
MCHC: 32.7 g/dL (ref 30.0–36.0)
MCV: 94.9 fL (ref 80.0–100.0)
Monocytes Absolute: 0.2 10*3/uL (ref 0.1–1.0)
Monocytes Relative: 5 %
Neutro Abs: 3 10*3/uL (ref 1.7–7.7)
Neutrophils Relative %: 76 %
Platelets: 90 10*3/uL — ABNORMAL LOW (ref 150–400)
RBC: 2.74 MIL/uL — ABNORMAL LOW (ref 4.22–5.81)
RDW: 14.7 % (ref 11.5–15.5)
WBC: 3.9 10*3/uL — ABNORMAL LOW (ref 4.0–10.5)
nRBC: 0 % (ref 0.0–0.2)

## 2020-08-17 LAB — SURGICAL PCR SCREEN
MRSA, PCR: NEGATIVE
Staphylococcus aureus: NEGATIVE

## 2020-08-17 SURGERY — AMPUTATION, TOE
Anesthesia: Monitor Anesthesia Care | Site: Toe | Laterality: Right

## 2020-08-17 MED ORDER — LIDOCAINE HCL (CARDIAC) PF 100 MG/5ML IV SOSY
PREFILLED_SYRINGE | INTRAVENOUS | Status: DC | PRN
  Administered 2020-08-17: 50 mg via INTRAVENOUS

## 2020-08-17 MED ORDER — PROPOFOL 500 MG/50ML IV EMUL
INTRAVENOUS | Status: DC | PRN
  Administered 2020-08-17: 75 ug/kg/min via INTRAVENOUS

## 2020-08-17 MED ORDER — BUPIVACAINE HCL 0.25 % IJ SOLN
INTRAMUSCULAR | Status: AC
  Filled 2020-08-17: qty 1

## 2020-08-17 MED ORDER — CHLORHEXIDINE GLUCONATE CLOTH 2 % EX PADS: 6.0000 | MEDICATED_PAD | Freq: Once | CUTANEOUS | Status: DC

## 2020-08-17 MED ORDER — PROPOFOL 10 MG/ML IV BOLUS
INTRAVENOUS | Status: DC | PRN
  Administered 2020-08-17: 50 mg via INTRAVENOUS

## 2020-08-17 MED ORDER — 0.9 % SODIUM CHLORIDE (POUR BTL) OPTIME
TOPICAL | Status: DC | PRN
  Administered 2020-08-17: 1000 mL

## 2020-08-17 MED ORDER — LACTATED RINGERS IV SOLN: INTRAVENOUS | Status: DC

## 2020-08-17 MED ORDER — CHLORHEXIDINE GLUCONATE CLOTH 2 % EX PADS
6.0000 | MEDICATED_PAD | Freq: Once | CUTANEOUS | Status: AC
  Administered 2020-08-17: 6 via TOPICAL

## 2020-08-17 MED ORDER — BUPIVACAINE HCL (PF) 0.25 % IJ SOLN
INTRAMUSCULAR | Status: DC | PRN
  Administered 2020-08-17: 10 mL

## 2020-08-17 MED ORDER — METOPROLOL SUCCINATE ER 25 MG PO TB24
12.5000 mg | ORAL_TABLET | Freq: Once | ORAL | Status: AC
  Administered 2020-08-17: 12.5 mg via ORAL
  Filled 2020-08-17: qty 0.5

## 2020-08-17 SURGICAL SUPPLY — 35 items
BLADE HEX COATED 2.75 (ELECTRODE) ×2 IMPLANT
BLADE OSCILLATING/SAGITTAL (BLADE)
BLADE SW THK.38XMED LNG THN (BLADE) IMPLANT
BNDG CONFORM 2 STRL LF (GAUZE/BANDAGES/DRESSINGS) IMPLANT
BNDG CONFORM 6X.82 1P STRL (GAUZE/BANDAGES/DRESSINGS) ×2 IMPLANT
BNDG ELASTIC 3X5.8 VLCR STR LF (GAUZE/BANDAGES/DRESSINGS) ×2 IMPLANT
BNDG ELASTIC 4X5.8 VLCR STR LF (GAUZE/BANDAGES/DRESSINGS) ×2 IMPLANT
BNDG ESMARK 4X9 LF (GAUZE/BANDAGES/DRESSINGS) ×2 IMPLANT
BNDG GAUZE ELAST 4 BULKY (GAUZE/BANDAGES/DRESSINGS) ×2 IMPLANT
COVER WAND RF STERILE (DRAPES) IMPLANT
DRSG EMULSION OIL 3X3 NADH (GAUZE/BANDAGES/DRESSINGS) ×2 IMPLANT
ELECT REM PT RETURN 15FT ADLT (MISCELLANEOUS) ×2 IMPLANT
GAUZE SPONGE 4X4 12PLY STRL (GAUZE/BANDAGES/DRESSINGS) ×2 IMPLANT
GAUZE XEROFORM 1X8 LF (GAUZE/BANDAGES/DRESSINGS) ×2 IMPLANT
GLOVE BIO SURGEON STRL SZ7.5 (GLOVE) ×4 IMPLANT
GLOVE BIOGEL PI IND STRL 7.5 (GLOVE) ×1 IMPLANT
GLOVE BIOGEL PI INDICATOR 7.5 (GLOVE) ×1
GOWN STRL REUS W/ TWL LRG LVL3 (GOWN DISPOSABLE) ×1 IMPLANT
GOWN STRL REUS W/ TWL XL LVL3 (GOWN DISPOSABLE) ×1 IMPLANT
GOWN STRL REUS W/TWL LRG LVL3 (GOWN DISPOSABLE) ×2
GOWN STRL REUS W/TWL XL LVL3 (GOWN DISPOSABLE) ×2
HEMOSTAT SNOW SURGICEL 2X4 (HEMOSTASIS) ×2 IMPLANT
KIT BASIN OR (CUSTOM PROCEDURE TRAY) ×2 IMPLANT
KIT TURNOVER KIT A (KITS) ×2 IMPLANT
NDL SAFETY ECLIPSE 18X1.5 (NEEDLE) ×1 IMPLANT
NEEDLE HYPO 18GX1.5 SHARP (NEEDLE) ×2
NEEDLE HYPO 25X1 1.5 SAFETY (NEEDLE) ×2 IMPLANT
NS IRRIG 1000ML POUR BTL (IV SOLUTION) IMPLANT
PACK ORTHO EXTREMITY (CUSTOM PROCEDURE TRAY) ×2 IMPLANT
PENCIL SMOKE EVACUATOR (MISCELLANEOUS) IMPLANT
SUT ETHILON 3 0 PS 1 (SUTURE) IMPLANT
SUT MNCRL AB 3-0 PS2 18 (SUTURE) IMPLANT
SUT PROLENE 2 0 SH DA (SUTURE) IMPLANT
SYR 20ML LL LF (SYRINGE) ×2 IMPLANT
UNDERPAD 30X36 HEAVY ABSORB (UNDERPADS AND DIAPERS) ×2 IMPLANT

## 2020-08-17 NOTE — Anesthesia Preprocedure Evaluation (Addendum)
Anesthesia Evaluation  Patient identified by MRN, date of birth, ID band Patient awake    Reviewed: Allergy & Precautions, NPO status , Patient's Chart, lab work & pertinent test results  Airway Mallampati: II  TM Distance: >3 FB Neck ROM: Limited    Dental no notable dental hx.    Pulmonary sleep apnea , former smoker,  Pulmonary HTN   Pulmonary exam normal breath sounds clear to auscultation       Cardiovascular hypertension, + CAD, + CABG and + DOE  + dysrhythmias Atrial Fibrillation + pacemaker + Valvular Problems/Murmurs MR  Rhythm:Regular Rate:Tachycardia     Neuro/Psych negative neurological ROS  negative psych ROS   GI/Hepatic negative GI ROS, Neg liver ROS,   Endo/Other  diabetes  Renal/GU negative Renal ROS  negative genitourinary   Musculoskeletal negative musculoskeletal ROS (+)   Abdominal   Peds negative pediatric ROS (+)  Hematology  (+) anemia ,   Anesthesia Other Findings   Reproductive/Obstetrics negative OB ROS                            Anesthesia Physical  Anesthesia Plan  ASA: III  Anesthesia Plan: MAC   Post-op Pain Management:    Induction:   PONV Risk Score and Plan: 2 and Ondansetron, Treatment may vary due to age or medical condition and Propofol infusion  Airway Management Planned: Natural Airway and Simple Face Mask  Additional Equipment:   Intra-op Plan:   Post-operative Plan:   Informed Consent: I have reviewed the patients History and Physical, chart, labs and discussed the procedure including the risks, benefits and alternatives for the proposed anesthesia with the patient or authorized representative who has indicated his/her understanding and acceptance.       Plan Discussed with: CRNA  Anesthesia Plan Comments:         Anesthesia Quick Evaluation

## 2020-08-17 NOTE — Progress Notes (Signed)
Patient seen in pre-op. Scheduled for right 4th toe amputation. Cellulitis to the leg improving. X-rays show osteomyelitis. Will proceed with toe amputation as scheduled. Surgical consent signed and no further questions or concerns noted. Reviewed the surgery and all alternatives, risks, complication. NPO.    IMPRESSION: 1. Findings worrisome for osteomyelitis in the tuft of the distal phalanx of the right fourth toe. 2. Midfoot osteoarthritis.

## 2020-08-17 NOTE — Anesthesia Postprocedure Evaluation (Signed)
Anesthesia Post Note  Patient: [name].  Procedure(s) Performed: AMPUTATION TOE 4th toe (Right Toe)     Patient location during evaluation: PACU Anesthesia Type: MAC Level of consciousness: awake and alert Pain management: pain level controlled Vital Signs Assessment: post-procedure vital signs reviewed and stable Respiratory status: spontaneous breathing, nonlabored ventilation, respiratory function stable and patient connected to nasal cannula oxygen Cardiovascular status: stable and blood pressure returned to baseline Postop Assessment: no apparent nausea or vomiting Anesthetic complications: no   No complications documented.  Last Vitals:  Vitals:   08/17/20 1310 08/17/20 1315  BP: 108/73 115/78  Pulse: 100 99  Resp: 18 19  Temp: 36.9 C   SpO2: 100% 100%    Last Pain:  Vitals:   08/17/20 1315  TempSrc:   PainSc: 0-No pain                 Tiajuana Amass

## 2020-08-17 NOTE — Transfer of Care (Signed)
Immediate Anesthesia Transfer of Care Note  Patient: [name].  Procedure(s) Performed: AMPUTATION TOE 4th toe (Right Toe)  Patient Location: PACU  Anesthesia Type:MAC  Level of Consciousness: awake, alert  and oriented  Airway & Oxygen Therapy: Patient Spontanous Breathing and Patient connected to face mask oxygen  Post-op Assessment: Report given to RN and Post -op Vital signs reviewed and stable  Post vital signs: Reviewed and stable  Last Vitals:  Vitals Value Taken Time  BP 108/73 08/17/20 1310  Temp    Pulse 100 08/17/20 1312  Resp 19 08/17/20 1312  SpO2 100 % 08/17/20 1312  Vitals shown include unvalidated device data.  Last Pain:  Vitals:   08/17/20 1124  TempSrc: Oral  PainSc:       Patients Stated Pain Goal: 0 (57/84/69 6295)  Complications: No complications documented.

## 2020-08-17 NOTE — Progress Notes (Signed)
PROGRESS NOTE   [name].  XQJ:[id number] DOB: 10-Oct-1941 DOA: 08/13/2020 PCP: [name], MD  Brief Narrative:  79 home dwelling white male Paroxysmal A. 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 Jannifer Franklin neurology history of [name]  Seen recently at podiatry office 08/10/2020 for right fourth toe wound which was red and swollen-had a hyperkeratotic lesion with granular wound and was recommended by Dr. Jacqualyn Posey and amputation at that time  He presented with early septic shock to the hospital on 8/28 likely secondary to presumed cellulitis/osteomyelitis but was able to resuscitate aggressively in the emergency room and was transferred to the progressive care Podiatrist performed surgery on 8/31   Assessment & Plan:   Principal Problem:   Severe sepsis [facility]) Active Problems:   Peripheral neuropathy   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)   Toe infection   OSA on CPAP   Pulmonary fibrosis (HCC)   Cellulitis of right lower leg   1. Sepsis + hypotension on admit with possible early septic shock secondary to fourth toe osteomyelitis s/p a. Procalcitonin elevated although lactic acid is normal-x-ray confirmed osteomyelitis of fourth toe b. Continuing ceftriaxone, vancomycin  c. duration per podiatrist depending on margins 2. Complicated cardiac history with pacemaker-followed by Dr. Lovena Le, a. Sick sinus syndrome CHADS2 score >4 with PPM--not an anticoagulation candidate secondary to spontaneous subdural hematoma in the past i. Increase Toprol-XL to  25 is not rate controlled on monitors ii. Have discontinued monitors today 8/30 b. CAD c. Right-sided heart failure  i. Gentle IV fluids at this time holding torsemide from prior to admission ii. -2.5 liters 3. Linezolid OSA a. We will place on auto titration device b. Monitor overnight carefully 4. Hypokalemia 5. AKI on admission a. Improved b. Note he is on torsemide chronically 6. History [name] a. He is probably not mounting a very good immune response secondary to MUGS 7. Fatty liver cirrhosis + splenomegaly a. Followed in the outpatient setting by Dr. Simeon Craft such b. No new changes today 8. Anemia of multifactorial causes a. Outpatient work-up and follow-up b. Saturation ratios are low will need repeat in about 3 weeks and consideration for replacement if iron levels are low at that time  DVT prophylaxis: Lovenox Code Status: Confirmed full code at the bedside Family Communication: No family present today phone number is [phone] disposition: Inpatient  Status is: Inpatient  Remains inpatient appropriate because:Hemodynamically unstable, Persistent severe electrolyte disturbances and Ongoing diagnostic testing needed not appropriate for outpatient work up   Dispo: The patient is from: Home              Anticipated d/c is to: SNF potentially depending on therapy input              Anticipated d/c date is: 2 days              Patient currently is not medically stable to d/c.   Consultants:   Dr. Earleen Newport of podiatry  Procedures: X-rays  Antimicrobials: Cefepime vancomycin   Subjective: Seen early this morning prior to surgery No complaints leg feels better less pain  Objective: Vitals:   08/17/20 1330 08/17/20 1345 08/17/20 1400 08/17/20  1413  BP: 111/80 121/84 125/88 (!) 130/93  Pulse: (!) 101 (!) 102 (!) 103 (!) 105  Resp: 14 (!) 27 17 18   Temp:   98 F (36.7 C) (!) 97.4 F (36.3 C)  TempSrc:    Oral  SpO2: 96% 94% 94% 99%  Weight:      Height:         Intake/Output Summary (Last 24 hours) at 08/17/2020 1512 Last data filed at 08/17/2020 1400 Gross per 24 hour  Intake 1130 ml  Output 2260 ml  Net -1130 ml   Filed Weights   08/14/20 1205  Weight: 84.8 kg    Examination:   NCAT no focal deficit Abdomen soft nontender no rebound no guarding Chest clear without added sound Abdomen soft Wounds seen about the same redness is however significantly decreased Swelling is decreased and he is able to move his foot around a little better  Data Reviewed: I have personally reviewed following labs and imaging studies Potassium 3.5 BUN/creatinine 18/1.1 WBC 4.4-->3.9 Hemoglobin 8.8-->8.5 Platelets 79-->90 Bilirubin 0.7 LFTs normal  Radiology Studies: VAS [place] LOWER EXTREMITY VENOUS (DVT)  Result Date: 08/16/2020  Lower Venous DVTStudy Indications: Swelling.  Risk Factors: None identified. Limitations: Poor ultrasound/tissue interface. Comparison Study: No prior studies. Performing Technologist: Oliver Hum RVT  Examination Guidelines: A complete evaluation includes B-mode imaging, spectral Doppler, color Doppler, and power Doppler as needed of all accessible portions of each vessel. Bilateral testing is considered an integral part of a complete examination. Limited examinations for reoccurring indications may be performed as noted. The reflux portion of the exam is performed with the patient in reverse Trendelenburg.  +---------+---------------+---------+-----------+----------+--------------+ RIGHT    CompressibilityPhasicitySpontaneityPropertiesThrombus Aging +---------+---------------+---------+-----------+----------+--------------+ CFV      Full           Yes      Yes                                 +---------+---------------+---------+-----------+----------+--------------+ SFJ      Full                                                        +---------+---------------+---------+-----------+----------+--------------+  FV Prox  Full                                                        +---------+---------------+---------+-----------+----------+--------------+ FV Mid   Full                                                        +---------+---------------+---------+-----------+----------+--------------+ FV DistalFull                                                        +---------+---------------+---------+-----------+----------+--------------+ PFV      Full                                                        +---------+---------------+---------+-----------+----------+--------------+  POP      Full           Yes      Yes                                 +---------+---------------+---------+-----------+----------+--------------+ PTV      Full                                                        +---------+---------------+---------+-----------+----------+--------------+ PERO     Full                                                        +---------+---------------+---------+-----------+----------+--------------+   +----+---------------+---------+-----------+----------+--------------+ LEFTCompressibilityPhasicitySpontaneityPropertiesThrombus Aging +----+---------------+---------+-----------+----------+--------------+ CFV Full           Yes      Yes                                 +----+---------------+---------+-----------+----------+--------------+     Summary: RIGHT: - There is no evidence of deep vein thrombosis in the lower extremity.  - No cystic structure found in the popliteal fossa.  LEFT: - No evidence of common femoral vein obstruction.  *See table(s) above for measurements and observations. Electronically signed by Ruta Hinds MD on 08/16/2020 at 4:32:23 PM.    Final      Scheduled Meds: . allopurinol  300 mg Oral Daily  . enoxaparin (LOVENOX) injection  40 mg Subcutaneous Q24H  . ferrous sulfate  325 mg Oral Q breakfast  . metoprolol succinate   25 mg Oral Daily  . mirabegron ER  50 mg Oral Daily  . multivitamin with minerals  1 tablet Oral Daily  . pantoprazole  40 mg Oral Daily  . rosuvastatin  40 mg Oral Daily  . sertraline  200 mg Oral Daily  . tamsulosin  0.4 mg Oral QHS   Continuous Infusions: . cefTRIAXone (ROCEPHIN)  IV 2 g (08/17/20 0211)  . vancomycin 1,250 mg (08/16/20 2100)     LOS: 4 days    Time spent: 25  Nita Sells, MD [facility] To contact the attending provider between 7A-7P or the covering provider during after hours 7P-7A, please log into the web site [email or web address] and access using universal [place] password for that web site. If you do not have the password, please call the hospital operator.  08/17/2020, 3:12 PM

## 2020-08-18 ENCOUNTER — Encounter (HOSPITAL_COMMUNITY): Payer: Self-pay | Admitting: Podiatry

## 2020-08-18 ENCOUNTER — Telehealth: Payer: Self-pay | Admitting: Podiatry

## 2020-08-18 ENCOUNTER — Ambulatory Visit (HOSPITAL_COMMUNITY): Admission: RE | Admit: 2020-08-18 | Payer: Medicare Other | Source: Home / Self Care | Admitting: Podiatry

## 2020-08-18 ENCOUNTER — Encounter (HOSPITAL_COMMUNITY): Admission: RE | Payer: Self-pay | Source: Home / Self Care

## 2020-08-18 DIAGNOSIS — G3183 Dementia with Lewy bodies: Secondary | ICD-10-CM

## 2020-08-18 DIAGNOSIS — G603 Idiopathic progressive neuropathy: Secondary | ICD-10-CM

## 2020-08-18 DIAGNOSIS — Z9989 Dependence on other enabling machines and devices: Secondary | ICD-10-CM

## 2020-08-18 DIAGNOSIS — L089 Local infection of the skin and subcutaneous tissue, unspecified: Secondary | ICD-10-CM

## 2020-08-18 DIAGNOSIS — G4733 Obstructive sleep apnea (adult) (pediatric): Secondary | ICD-10-CM

## 2020-08-18 DIAGNOSIS — L03115 Cellulitis of right lower limb: Secondary | ICD-10-CM

## 2020-08-18 DIAGNOSIS — J8410 Pulmonary fibrosis, unspecified: Secondary | ICD-10-CM

## 2020-08-18 DIAGNOSIS — F0281 Dementia in other diseases classified elsewhere with behavioral disturbance: Secondary | ICD-10-CM

## 2020-08-18 LAB — CREATININE, SERUM
Creatinine, Ser: 1.17 mg/dL (ref 0.61–1.24)
GFR calc Af Amer: >60 mL/min (ref >60)
GFR calc non Af Amer: 60 mL/min — ABNORMAL LOW (ref >60)

## 2020-08-18 SURGERY — AMPUTATION DIGIT
Anesthesia: Monitor Anesthesia Care | Laterality: Right

## 2020-08-18 NOTE — Progress Notes (Signed)
PROGRESS NOTE  [name].  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 Jannifer Franklin neurology history of [name]  Seen recently at podiatry office 08/10/2020 for right fourth toe wound which was red and swollen-had a hyperkeratotic lesion with granular wound and was recommended by Dr. 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 Jacqualyn Posey.  - Significant improvement in proximal cellulitis on current abx which we will continue. Note wound culture as outpatient 8/12 revealed pan-sensitive pseudomonas. If clinical improvement does not continue, would switch to anti-pseudomonal antibiotic (e.g. ciprofloxacin at DC) - WBAT per podiatry.  - Wound dressing to be changed 9/1 per podiatry.  CAD, chronic right-sided CHF, SSS s/p PPM.  - Not on  anticoagulation due to hx spontaneous SDH.  - Continue metoprolol - Appears euvolemic currently.   OSA:  - CPAP  AKI: Resolved.  Hypokalemia:   - Monitoring  NAFLD cirrhosis with splenomegaly and pancytopenia: Noted.  Anemia of chronic disease:  - Outpatient work up if indicated at follow up.  [name]: Noted  DVT prophylaxis: Lovenox Code Status: Full Family Communication: Wife updated by podiatry today Disposition Plan:  Status is: Inpatient  Remains inpatient appropriate because:Inpatient level of care appropriate due to severity of illness   Dispo:  Patient From: Home  Planned Disposition: Home with [facility]  Expected discharge date: 08/19/20  Medically stable for discharge: No  Consultants:   Podiatry  Procedures:  08/17/20 AMPUTATION TOE 4th toe Trula Slade, DPM   Antimicrobials:  Vancomycin, ceftriaxone   Subjective: Minimal pain, only when leg is touched and overall much improved. No fever. Worked with PT.   Objective: Vitals:   08/17/20 1413 08/17/20 1853 08/17/20 2228 08/18/20 0543  BP: (!) 130/93 125/89 117/78 121/74  Pulse: (!) 105 (!) 106 (!) 105 (!) 101  Resp: 18 20 20 17   Temp: (!) 97.4 F (36.3 C) 98.1 F (36.7 C) 98.6 F (37 C) 98.5 F (36.9 C)  TempSrc: Oral  Oral Oral  SpO2: 99% 99% 99% 94%  Weight:      Height:        Intake/Output Summary (Last 24 hours) at 08/18/2020 1801 Last data filed at 08/18/2020 1655 Gross per 24 hour  Intake 590 ml  Output 1125 ml  Net -535 ml   Filed Weights   08/14/20 1205  Weight: 84.8 kg    Gen: 79 y.o. male in no distress  Pulm: Non-labored breathing room air. Clear to auscultation bilaterally.  CV: Regular rate and rhythm. No murmur, rub, or gallop. No JVD. GI: Abdomen soft, non-tender, non-distended, with normoactive bowel sounds. No organomegaly or masses felt. Ext: Warm, no deformities Skin: Remote amputations of left 4th toe, right 2nd and now recent 4th toe. Proximal erythema is mild and  receding from demarcations, tenderness improved per pt. Neuro: Alert and oriented. No focal neurological deficits. Psych: Judgement and insight appear normal. Mood & affect appropriate.   Data Reviewed: I have personally reviewed following labs and imaging studies  CBC: Recent Labs  Lab 08/13/20 1314 08/14/20 0428 08/15/20 0618 08/16/20 0636 08/17/20 0536  WBC 8.5 7.1 4.5 4.4 3.9*  NEUTROABS 7.6  --  3.7 3.5 3.0  HGB 11.5* 9.7* 8.5* 8.8* 8.5*  HCT 35.1* 29.3* 25.9* 26.9* 26.0*  MCV 93.6 94.8 94.2 94.7 94.9  PLT 96* 75* 65* 79* 90*   Basic Metabolic Panel: Recent Labs  Lab 08/13/20 1314 08/13/20 1314 08/14/20 0428 08/15/20 0618 08/16/20 0636 08/17/20 0536 08/18/20 0538  NA 141  --  138 136 136 135  --   K 4.0  --  3.1* 3.3* 3.5 3.5  --   CL 102  --  101 104 105 103  --   CO2 28  --  26 26 23 23   --   GLUCOSE 126*  --  106* 98 93 91  --   BUN 30*  --  30* 24* 20 18  --   CREATININE 1.86*   < > 1.59* 1.29* 1.19 1.10 1.17  CALCIUM 9.1  --  8.5* 8.5* 8.4* 8.5*  --    < > = values in this interval not displayed.   GFR: Estimated Creatinine Clearance: 56.1 mL/min (by C-G formula based on SCr of 1.17 mg/dL). Liver Function Tests: Recent Labs  Lab 08/13/20 1314 08/14/20 0428 08/15/20 0618 08/16/20 0636 08/17/20 0536  AST 45* 41 39 39 49*  ALT 32 27 24 25 30   ALKPHOS 89 61 64 74 100  BILITOT 1.6* 1.3* 0.9 0.7 0.8  PROT 8.4* 6.6 6.4* 6.4* 6.6  ALBUMIN 4.3 3.2* 2.9* 2.8* 2.9*   No results for input(s): LIPASE, AMYLASE in the last 168 hours. No results for input(s): AMMONIA in the last 168 hours. Coagulation Profile: Recent Labs  Lab 08/14/20 0428 08/15/20 0618  INR 1.6* 1.3*   Cardiac Enzymes: No results for input(s): CKTOTAL, CKMB, CKMBINDEX, TROPONINI in the last 168 hours. BNP (last 3 results) No results for input(s): PROBNP in the last 8760 hours. HbA1C: No results for input(s): HGBA1C in the last 72 hours. CBG: Recent Labs  Lab 08/17/20 1313    GLUCAP 90   Lipid Profile: No results for input(s): CHOL, HDL, LDLCALC, TRIG, CHOLHDL, LDLDIRECT in the last 72 hours. Thyroid Function Tests: No results for input(s): TSH, T4TOTAL, FREET4, T3FREE, THYROIDAB in the last 72 hours. Anemia Panel: No results for input(s): VITAMINB12, FOLATE, FERRITIN, TIBC, IRON, RETICCTPCT in the last 72 hours. Urine analysis:    Component Value Date/Time   COLORURINE YELLOW 02/06/2020 0831   APPEARANCEUR CLEAR 02/06/2020 0831   LABSPEC 1.015 02/06/2020 0831   PHURINE 5.0 02/06/2020 0831   GLUCOSEU NEGATIVE 02/06/2020 0831   HGBUR NEGATIVE 02/06/2020 0831   BILIRUBINUR NEGATIVE 02/06/2020 0831   KETONESUR NEGATIVE 02/06/2020 0831   PROTEINUR NEGATIVE 02/06/2020 0831   UROBILINOGEN 0.2 01/07/2008 1137   NITRITE NEGATIVE 02/06/2020 0831   LEUKOCYTESUR NEGATIVE 02/06/2020 0831   Recent Results (from the past 240 hour(s))  SARS Coronavirus 2 by RT  PCR (hospital order, performed in [facility] hospital lab) Nasopharyngeal Nasopharyngeal Swab     Status: None   Collection Time: 08/13/20  8:12 PM   Specimen: Nasopharyngeal Swab  Result Value Ref Range Status   SARS Coronavirus 2 NEGATIVE NEGATIVE Final    Comment: (NOTE) SARS-CoV-2 target nucleic acids are NOT DETECTED.  The SARS-CoV-2 RNA is generally detectable in upper and lower respiratory specimens during the acute phase of infection. The lowest concentration of SARS-CoV-2 viral copies this assay can detect is 250 copies / mL. A negative result does not preclude SARS-CoV-2 infection and should not be used as the sole basis for treatment or other patient management decisions.  A negative result may occur with improper specimen collection / handling, submission of specimen other than nasopharyngeal swab, presence of viral mutation(s) within the areas targeted by this assay, and inadequate number of viral copies (<250 copies / mL). A negative result must be combined with clinical observations,  patient history, and epidemiological information.  Fact Sheet for Patients:   StrictlyIdeas.no  Fact Sheet for Healthcare Providers: BankingDealers.co.za  This test is not yet approved or  cleared by the [place] FDA and has been authorized for detection and/or diagnosis of SARS-CoV-2 by FDA under an Emergency Use Authorization (EUA).  This EUA will remain in effect (meaning this test can be used) for the duration of the COVID-19 declaration under Section 564(b)(1) of the Act, 21 U.S.C. section 360bbb-3(b)(1), unless the authorization is terminated or revoked sooner.  Performed at [facility], [place] [address] [place], [place] [address]   Surgical pcr screen     Status: None   Collection Time: 08/17/20  7:54 AM   Specimen: Nasal Mucosa; Nasal Swab  Result Value Ref Range Status   MRSA, PCR NEGATIVE NEGATIVE Final   Staphylococcus aureus NEGATIVE NEGATIVE Final    Comment: (NOTE) The Xpert SA Assay (FDA approved for NASAL specimens in patients 63 years of age and older), is one component of a comprehensive surveillance program. It is not intended to diagnose infection nor to guide or monitor treatment. Performed at [facility], [place] [address], [place], [place] [address]       Radiology Studies: DG Foot 2 Views Right  Result Date: 08/17/2020 CLINICAL DATA:  Fourth toe amputation.  Postoperative image EXAM: RIGHT FOOT - 2 VIEW COMPARISON:  08/14/2020 FINDINGS: Interval postsurgical changes from amputation of the fourth digit at the level of the fourth MTP joint. Patient has had prior second digit amputation at the level of the second MTP joint. Remaining osseous structures appear intact without fracture. No evidence of acute osteomyelitis. Chronic degenerative changes within the midfoot most prevalent at the naviculocuneiform joint. There are expected postoperative changes within the soft  tissues at the amputation site. IMPRESSION: Interval postsurgical changes from amputation of the fourth digit at the level of the fourth MTP joint. Electronically Signed   By: Davina Poke D.O.   On: 08/17/2020 16:57    Scheduled Meds: . allopurinol  300 mg Oral Daily  . enoxaparin (LOVENOX) injection  40 mg Subcutaneous Q24H  . ferrous sulfate  325 mg Oral Q breakfast  . metoprolol succinate  25 mg Oral Daily  . mirabegron ER  50 mg Oral Daily  . multivitamin with minerals  1 tablet Oral Daily  . pantoprazole  40 mg Oral Daily  . rosuvastatin  40 mg Oral Daily  . sertraline  200 mg Oral Daily  . tamsulosin  0.4  mg Oral QHS   Continuous Infusions: . cefTRIAXone (ROCEPHIN)  IV 2 g (08/18/20 0126)  . vancomycin 1,250 mg (08/17/20 2017)     LOS: 5 days   Time spent: 25 minutes.  Patrecia Pour, MD [facility] [email or web address] 08/18/2020, 6:01 PM

## 2020-08-18 NOTE — Progress Notes (Signed)
Called Dr Leigh Aurora office, spoke wit triage nurse Anderson Malta, in regards to getting clarification orders per PT request RE: patient's weight bearing status and need for DME (boot support), pending call back at this time

## 2020-08-18 NOTE — Progress Notes (Signed)
Pharmacy Antibiotic Note  [name]. is a 79 y.o. male admitted on 08/13/2020 with c/o worsening right leg swelling and redness. S/p right 4th toe amputation 8/31. Pharmacy has been consulted for Vancomycin dosing for osteomyelitis. Patient also on Ceftriaxone per MD.   Plan: Continue Vancomycin 1250mg  IV q24h for now (AUC 437 based on SCr 1.17) Ceftriaxone 2g IV q24h per MD Monitor renal function, duration of therapy   Height: 5\' 8"  (172.7 cm) Weight: 84.8 kg (187 lb) IBW/kg (Calculated) : 68.4  Temp (24hrs), Avg:98.4 F (36.9 C), Min:98.1 F (36.7 C), Max:98.6 F (37 C)  Recent Labs  Lab 08/13/20 1314 08/13/20 1314 08/13/20 2121 08/14/20 0428 08/15/20 0618 08/16/20 0636 08/17/20 0536 08/18/20 0538  WBC 8.5  --   --  7.1 4.5 4.4 3.9*  --   CREATININE 1.86*   < >  --  1.59* 1.29* 1.19 1.10 1.17  LATICACIDVEN 1.5  --  1.0  --   --   --   --   --    < > = values in this interval not displayed.    Estimated Creatinine Clearance: 56.1 mL/min (by C-G formula based on SCr of 1.17 mg/dL).    Allergies  Allergen Reactions  . Aricept [Donepezil] Other (See Comments)    Hallucination   . Lipitor [Atorvastatin] Other (See Comments)    Stiff joints  . 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 Warfarin And Related Other (See Comments)    Stiff joints  . Enbrel [Etanercept] Itching    Antimicrobials this admission: 8/27 Cefepime x 1 8/27 Vancomycin >> 8/28 Ceftriaxone >>  Thank you for allowing pharmacy to be a part of this patient's care.  [name], PharmD, BCPS Clinical Pharmacist  08/18/2020, 3:11 PM

## 2020-08-18 NOTE — Telephone Encounter (Signed)
I called and spoke with he RN. Orders placed

## 2020-08-18 NOTE — Op Note (Signed)
Surgeon: Celesta Gentile, DPM Assistants: none Pre-operative diagnosis: Right 4th toe osteomyelitis Post-operative diagnosis: same Procedure: Right 4th toe ampuation Pathology: Specimen: toe for pathology Pertinent Intra-op findings: see below Anesthesia: MAC with 10 cc lidocaine with marcaine plain Hemostasis: Anatomic EBL: 25 cc Complications: none  Indications for surgery: 79 year old male admitted to the hospital for cellulitis of the RLE and found to have osteomyelitis of the right 4th toe. He was originally scheduled for outpatient surgery however the infection worsened and he was admitted.  Patient and wife agree to proceed with surgery. All alternatives, risks, complications were discussed with the patient detail. No promises or guarantees were given as to the outcome of the procedure and all questions were answered to best of my ability.   Procedure in detail: The patient was both verbally and visually identified by myself, the nursing staff, and anesthesia staff in the preoperative holding area. They were then transferred to the operating room and placed on the operative table in supine position.  Timeout was performed.  Prior to incision a mixture of 10 cc of lidocaine, Marcaine plain was infiltrated in a regional block fashion.  The right lower extremities and scrubbed prepped and draped in normal sterile fashion.  Secondary timeout was performed.  A modified racquet shaped incision was planned along the right fourth MPJ.  15 by scalpel was utilized to make an incision from skin to bone just distal to the MPJ.  The toe was then disarticulated at the level of the MPJ.  This was proximal to the area of infection.  The remaining metatarsal bone appeared viable.  Is hard in nature and white in color and there is no purulence identified.  The incision was copiously irrigated and hemostasis achieved.  Surgifoam was utilized to help aid in hemostasis.  3-0 Prolene was then utilized for skin  closure.  Xeroform was applied followed by a dry sterile dressing.  He was awoken from anesthesia and found to tolerate the procedure well any complications.  Patient to remain inpatient and remain on IV antibiotics. WB to heel in darco wedge shoe.    Celesta Gentile, DPM

## 2020-08-18 NOTE — Progress Notes (Signed)
Subjective: POD # 1 s/p right fourth toe amputation.  He is doing well he did just finished working with physical therapy.  He walked to the nurse station and a Darco wedge shoe and did well.  Otherwise he feels well and denies any fevers, chills has no nausea or vomiting.  The pain to the leg is also much improved.  He has no new concerns.  He has no pain at the surgical site.  Objective: AAO x3, NAD Dressing clean, dry, intact.  There is no strikethrough.  Swelling to the right leg is much improved and there is much improved erythema and only minimal warmth remains.  There is chronic skin discoloration bilaterally from venous insufficiency.  No other open lesions are identified. No pain with calf compression  Assessment: POD #1 s/p right 4th toe amputation  Plan: Doing well today.  He is having no pain and is afebrile.  We will plan change the dressing tomorrow continue physical therapy.  He is weightbearing to the heel and Darco wedge shoe.  Possible discharge Thursday.  Updated the patient's wife via phone.  Celesta Gentile, DPM

## 2020-08-18 NOTE — Evaluation (Signed)
Physical Therapy Evaluation Patient Details Name: [name]. MRN: [id number] DOB: December 24, 1940 Today's Date: 08/18/2020   History of Present Illness  79 y.o. male with medical history significant of peripheral neuropathy, diastolic dysfunction CHF, atrial fibrillation, BPH, coronary artery disease status post CABG in 2009, diabetes, hyperlipidemia, hypertension, obstructive sleep apnea, status post pacemaker placement, Lewy body dementia and pulmonary fibrosis who has had previous amputation of the right fourth toe and the left fourth and fifth toe presenting to the ER with right lower extremity swelling redness and pain.  Pt s/p Right 4th toe ampuation due to osteomyelitis on 08/17/20  Clinical Impression  Pt admitted with above diagnosis.  Pt currently with functional limitations due to the deficits listed below (see PT Problem List). Pt will benefit from skilled PT to increase their independence and safety with mobility to allow discharge to the venue listed below.   Pt educated on PWB status and aware to use Darco wedge shoe for mobility.  Pt reports generalized weakness and required increased time with ambulating however only min/guard assist for safety.     Follow Up Recommendations Home health PT    Equipment Recommendations  None recommended by PT    Recommendations for Other Services       Precautions / Restrictions Precautions Precautions: Fall Restrictions Weight Bearing Restrictions: Yes RLE Weight Bearing: Partial weight bearing Other Position/Activity Restrictions: with Darco wedge shoe      Mobility  Bed Mobility Overal bed mobility: Needs Assistance Bed Mobility: Supine to Sit     Supine to sit: Min assist     General bed mobility comments: slight assist for upper body  Transfers Overall transfer level: Needs assistance Equipment used: Rolling walker (2 wheeled) Transfers: Sit to/from Stand Sit to Stand: Min guard         General transfer comment:  verbal cues for UE and LE positioning, min/guard for safety, increased time to rise  Ambulation/Gait Ambulation/Gait assistance: Min guard Gait Distance (Feet): 160 Feet Assistive device: Rolling walker (2 wheeled) Gait Pattern/deviations: Step-to pattern;Antalgic     General Gait Details: verbal cues for sequence, step to for PWB (and wedge shoe creating LLD), maintained PWB well, cues for use of RW  Stairs            Wheelchair Mobility    Modified Rankin (Stroke Patients Only)       Balance Overall balance assessment: History of Falls                                           Pertinent Vitals/Pain Pain Assessment: No/denies pain    Home Living Family/patient expects to be discharged to:: Private residence Living Arrangements: Spouse/significant other Available Help at Discharge: Family;Available 24 hours/day Type of Home: House Home Access: Stairs to enter Entrance Stairs-Rails: Right;Left;Can reach both Entrance Stairs-Number of Steps: 3 Home Layout: One level Home Equipment: Walker - 2 wheels;Wheelchair - [employer];Shower seat - built in;Cane - single point      Prior Function Level of Independence: Independent with assistive device(s)         Comments: has SPC at home intermittent use, typically uses rollator per therapy rec, lots of falls, spends a lot of time with grandson, likes golf     Hand Dominance        Extremity/Trunk Assessment        Lower Extremity Assessment Lower  Extremity Assessment: RLE deficits/detail RLE Deficits / Details: right foot wrapped in dressing, utilized Darco wedge shoe for mobility, pt aware of PWB status       Communication   Communication: HOH  Cognition Arousal/Alertness: Awake/alert Behavior During Therapy: WFL for tasks assessed/performed Overall Cognitive Status: Within Functional Limits for tasks assessed                                         General Comments      Exercises     Assessment/Plan    PT Assessment Patient needs continued PT services  PT Problem List Decreased strength;Decreased mobility;Decreased knowledge of use of DME;Decreased balance;Decreased knowledge of precautions       PT Treatment Interventions DME instruction;Therapeutic exercise;Gait training;Balance training;Functional mobility training;Stair training;Therapeutic activities;Patient/family education    PT Goals (Current goals can be found in the Care Plan section)  Acute Rehab PT Goals PT Goal Formulation: With patient Time For Goal Achievement: 09/01/20 Potential to Achieve Goals: Good    Frequency Min 3X/week   Barriers to discharge        Co-evaluation               AM-PAC PT "6 Clicks" Mobility  Outcome Measure Help needed turning from your back to your side while in a flat bed without using bedrails?: A Little Help needed moving from lying on your back to sitting on the side of a flat bed without using bedrails?: A Little Help needed moving to and from a bed to a chair (including a wheelchair)?: A Little Help needed standing up from a chair using your arms (e.g., wheelchair or bedside chair)?: A Little Help needed to walk in hospital room?: A Little Help needed climbing 3-5 steps with a railing? : A Little 6 Click Score: 18    End of Session Equipment Utilized During Treatment: Gait belt Activity Tolerance: Patient tolerated treatment well Patient left: in chair;with call bell/phone within reach;with chair alarm set Nurse Communication: Mobility status PT Visit Diagnosis: Difficulty in walking, not elsewhere classified (R26.2)    Time: 5051-8335 PT Time Calculation (min) (ACUTE ONLY): 27 min   Charges:   PT Evaluation $PT Eval Low Complexity: 1 Low PT Treatments $Gait Training: 8-22 mins       Arlyce Dice, DPT Acute Rehabilitation Services Pager: [phone] Office: [phone]  York Ram E 08/18/2020,  3:45 PM

## 2020-08-18 NOTE — Plan of Care (Signed)
  Problem: Education: Goal: Knowledge of General Education information will improve Description Including pain rating scale, medication(s)/side effects and non-pharmacologic comfort measures Outcome: Progressing   Problem: Health Behavior/Discharge Planning: Goal: Ability to manage health-related needs will improve Outcome: Progressing   

## 2020-08-18 NOTE — Telephone Encounter (Signed)
Nurse Joelene Millin at [facility] called stating Physical Therapy needs patient's weightbearing status and what DME patient will need to be wearing. Nurse Kimberly's call back number is [phone].

## 2020-08-19 LAB — SURGICAL PATHOLOGY

## 2020-08-19 LAB — CREATININE, SERUM
Creatinine, Ser: 0.97 mg/dL (ref 0.61–1.24)
GFR calc Af Amer: >60 mL/min (ref >60)
GFR calc non Af Amer: >60 mL/min (ref >60)

## 2020-08-19 MED ORDER — LIP MEDEX EX OINT
TOPICAL_OINTMENT | CUTANEOUS | Status: AC
  Administered 2020-08-19: 1 via TOPICAL
  Filled 2020-08-19: qty 7

## 2020-08-19 NOTE — Progress Notes (Signed)
Subjective: POD # 2 s/p right fourth toe amputation.  He is doing well he is not having any pain at surgical site.  His leg is feeling better as well.  He still has been working physical therapy but does not feel he is ready to go home as he is scared of falling but he also does not want go to rehab.  Currently denies any fevers or chills.  No other consistent concerns.   Objective: AAO x3, NAD Dressing clean, dry, intact.  Upon removal of the bandage the incision is well coapted with sutures intact.  There is no surrounding erythema, drainage or pus to the incision site and there is no fluctuation crepitation.  There is significant erythema to the foot.  Erythema starts proximal to the ankle and goes distal to the knee and there is slight warmth compared to contra extremity but overall improved and the swelling is much improved.  There is no areas of fluctuation or crepitation of the leg currently.  No open lesions in the leg.  Venous insufficiency present bilaterally. No pain with calf compression          Assessment: POD # 2 s/p right 4th toe amputation  Plan: Dressing was changed today.  Xeroform was applied followed by dry dressing.  At this point surgical site appears to be doing well and infection much better controlled.  Likely discharge home with oral antibiotics for 1 additional week.  However discharge pending physical therapy.  He does not feel he is ready to go home at this point.  Hopefully as therapy continues to work with him he will feel better about doing this.  Discussed possibly home physical therapy to be set up for him as well. Updated wife via phone tonight  Celesta Gentile, DPM

## 2020-08-19 NOTE — Progress Notes (Signed)
PROGRESS NOTE  [name].  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 Jannifer Franklin neurology history of [name]  Seen recently at podiatry office 08/10/2020 for right fourth toe wound which was red and swollen-had a hyperkeratotic lesion with granular wound and was recommended by Dr. 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 Jacqualyn Posey.  - Significant improvement in proximal cellulitis on current abx which we will continue. Will get 7th dose of antibiotics overnight if he stays in the hospital. If not, would defer to podiatry for duration of antibiotics. Note wound culture as outpatient 8/12 revealed pan-sensitive pseudomonas. If clinical improvement does not continue, would switch to anti-pseudomonal antibiotic (e.g. ciprofloxacin at DC) - WBAT  per podiatry.  - Wound dressing to be changed 9/1 per podiatry [facility] ordered.  CAD, chronic right-sided CHF, SSS s/p PPM.  - Not on anticoagulation due to hx spontaneous SDH.  - Continue metoprolol - Appears euvolemic currently.   OSA:  - CPAP  AKI: Resolved.  Hypokalemia:   - Monitoring  NAFLD cirrhosis with splenomegaly and pancytopenia: Noted.  Anemia of chronic disease:  - Outpatient work up if indicated at follow up.  [name]: Noted  DVT prophylaxis: Lovenox Code Status: Full Family Communication: None at bedside Disposition Plan:  Status is: Inpatient  Remains inpatient appropriate because:Inpatient level of care appropriate due to severity of illness. Will await podiatry input for timing of discharge.    Dispo:  Patient From: Home  Planned Disposition: Home with [facility]  Expected discharge date: 08/19/20   Medically stable for discharge: No  Consultants:   Podiatry  Procedures:  08/17/20 AMPUTATION TOE 4th toe Trula Slade, DPM   Antimicrobials:  Vancomycin, ceftriaxone   Subjective: Minimal pain, no fever, tenderness on palpation is improving in the right leg, not completely gone.  Objective: Vitals:   08/18/20 0543 08/18/20 2102 08/19/20 0523 08/19/20 1308  BP: 121/74 116/82 120/79 120/80  Pulse: (!) 101 (!) 101 98 (!) 107  Resp: 17 18 18 20   Temp: 98.5 F (36.9 C) 98 F (36.7 C) 98.3 F (36.8 C) 98.6 F (37 C)  TempSrc: Oral     SpO2: 94% 98% 91% 97%  Weight:      Height:        Intake/Output Summary (Last 24 hours) at 08/19/2020 1710 Last data filed at 08/19/2020 1300 Gross per 24 hour  Intake 1310 ml  Output 1200 ml  Net 110 ml   Filed  Weights   08/14/20 1205  Weight: 84.8 kg   Gen: 79 y.o. male in no distress Pulm: Nonlabored breathing room air. Clear. CV: Regular rate and rhythm. No murmur, rub, or gallop. No JVD, no new dependent edema. GI: Abdomen soft, non-tender, non-distended, with normoactive bowel sounds.  Ext: Warm, no  deformities Skin: No new rashes, lesions or ulcers on visualized skin. RLE dressing c/d/i. RLE erythema and warmth is improved, still somewhat tender Neuro: Alert and oriented. No focal neurological deficits. Psych: Judgement and insight appear fair. Mood euthymic & affect congruent. Behavior is appropriate.    Data Reviewed: I have personally reviewed following labs and imaging studies  CBC: Recent Labs  Lab 08/13/20 1314 08/14/20 0428 08/15/20 0618 08/16/20 0636 08/17/20 0536  WBC 8.5 7.1 4.5 4.4 3.9*  NEUTROABS 7.6  --  3.7 3.5 3.0  HGB 11.5* 9.7* 8.5* 8.8* 8.5*  HCT 35.1* 29.3* 25.9* 26.9* 26.0*  MCV 93.6 94.8 94.2 94.7 94.9  PLT 96* 75* 65* 79* 90*   Basic Metabolic Panel: Recent Labs  Lab 08/13/20 1314 08/13/20 1314 08/14/20 0428 08/14/20 0428 08/15/20 0618 08/16/20 0636 08/17/20 0536 08/18/20 0538 08/19/20 0539  NA 141  --  138  --  136 136 135  --   --   K 4.0  --  3.1*  --  3.3* 3.5 3.5  --   --   CL 102  --  101  --  104 105 103  --   --   CO2 28  --  26  --  26 23 23   --   --   GLUCOSE 126*  --  106*  --  98 93 91  --   --   BUN 30*  --  30*  --  24* 20 18  --   --   CREATININE 1.86*   < > 1.59*   < > 1.29* 1.19 1.10 1.17 0.97  CALCIUM 9.1  --  8.5*  --  8.5* 8.4* 8.5*  --   --    < > = values in this interval not displayed.   GFR: Estimated Creatinine Clearance: 67.7 mL/min (by C-G formula based on SCr of 0.97 mg/dL). Liver Function Tests: Recent Labs  Lab 08/13/20 1314 08/14/20 0428 08/15/20 0618 08/16/20 0636 08/17/20 0536  AST 45* 41 39 39 49*  ALT 32 27 24 25 30   ALKPHOS 89 61 64 74 100  BILITOT 1.6* 1.3* 0.9 0.7 0.8  PROT 8.4* 6.6 6.4* 6.4* 6.6  ALBUMIN 4.3 3.2* 2.9* 2.8* 2.9*   No results for input(s): LIPASE, AMYLASE in the last 168 hours. No results for input(s): AMMONIA in the last 168 hours. Coagulation Profile: Recent Labs  Lab 08/14/20 0428 08/15/20 0618  INR 1.6* 1.3*   Cardiac Enzymes: No results for input(s): CKTOTAL,  CKMB, CKMBINDEX, TROPONINI in the last 168 hours. BNP (last 3 results) No results for input(s): PROBNP in the last 8760 hours. HbA1C: No results for input(s): HGBA1C in the last 72 hours. CBG: Recent Labs  Lab 08/17/20 1313  GLUCAP 90   Lipid Profile: No results for input(s): CHOL, HDL, LDLCALC, TRIG, CHOLHDL, LDLDIRECT in the last 72 hours. Thyroid Function Tests: No results for input(s): TSH, T4TOTAL, FREET4, T3FREE, THYROIDAB in the last 72 hours. Anemia Panel: No results for input(s): VITAMINB12, FOLATE, FERRITIN, TIBC, IRON, RETICCTPCT in the last 72 hours. Urine analysis:    Component Value Date/Time   COLORURINE YELLOW 02/06/2020 0831   APPEARANCEUR CLEAR 02/06/2020 0831  LABSPEC 1.015 02/06/2020 0831   PHURINE 5.0 02/06/2020 0831   GLUCOSEU NEGATIVE 02/06/2020 0831   HGBUR NEGATIVE 02/06/2020 0831   BILIRUBINUR NEGATIVE 02/06/2020 0831   KETONESUR NEGATIVE 02/06/2020 0831   PROTEINUR NEGATIVE 02/06/2020 0831   UROBILINOGEN 0.2 01/07/2008 1137   NITRITE NEGATIVE 02/06/2020 0831   LEUKOCYTESUR NEGATIVE 02/06/2020 0831   Recent Results (from the past 240 hour(s))  SARS Coronavirus 2 by RT PCR (hospital order, performed in [facility] hospital lab) Nasopharyngeal Nasopharyngeal Swab     Status: None   Collection Time: 08/13/20  8:12 PM   Specimen: Nasopharyngeal Swab  Result Value Ref Range Status   SARS Coronavirus 2 NEGATIVE NEGATIVE Final    Comment: (NOTE) SARS-CoV-2 target nucleic acids are NOT DETECTED.  The SARS-CoV-2 RNA is generally detectable in upper and lower respiratory specimens during the acute phase of infection. The lowest concentration of SARS-CoV-2 viral copies this assay can detect is 250 copies / mL. A negative result does not preclude SARS-CoV-2 infection and should not be used as the sole basis for treatment or other patient management decisions.  A negative result may occur with improper specimen collection / handling, submission of  specimen other than nasopharyngeal swab, presence of viral mutation(s) within the areas targeted by this assay, and inadequate number of viral copies (<250 copies / mL). A negative result must be combined with clinical observations, patient history, and epidemiological information.  Fact Sheet for Patients:   StrictlyIdeas.no  Fact Sheet for Healthcare Providers: BankingDealers.co.za  This test is not yet approved or  cleared by the [place] FDA and has been authorized for detection and/or diagnosis of SARS-CoV-2 by FDA under an Emergency Use Authorization (EUA).  This EUA will remain in effect (meaning this test can be used) for the duration of the COVID-19 declaration under Section 564(b)(1) of the Act, 21 U.S.C. section 360bbb-3(b)(1), unless the authorization is terminated or revoked sooner.  Performed at [facility], [place] [address] [place], [place] [address]   Surgical pcr screen     Status: None   Collection Time: 08/17/20  7:54 AM   Specimen: Nasal Mucosa; Nasal Swab  Result Value Ref Range Status   MRSA, PCR NEGATIVE NEGATIVE Final   Staphylococcus aureus NEGATIVE NEGATIVE Final    Comment: (NOTE) The Xpert SA Assay (FDA approved for NASAL specimens in patients 74 years of age and older), is one component of a comprehensive surveillance program. It is not intended to diagnose infection nor to guide or monitor treatment. Performed at [facility], [place] [address], [place], [place] [address]       Radiology Studies: No results found.  Scheduled Meds: . allopurinol  300 mg Oral Daily  . enoxaparin (LOVENOX) injection  40 mg Subcutaneous Q24H  . ferrous sulfate  325 mg Oral Q breakfast  . metoprolol succinate  25 mg Oral Daily  . mirabegron ER  50 mg Oral Daily  . multivitamin with minerals  1 tablet Oral Daily  . pantoprazole  40 mg Oral Daily  . rosuvastatin  40 mg Oral  Daily  . sertraline  200 mg Oral Daily  . tamsulosin  0.4 mg Oral QHS   Continuous Infusions: . cefTRIAXone (ROCEPHIN)  IV 2 g (08/19/20 0127)  . vancomycin 1,250 mg (08/18/20 2050)     LOS: 6 days   Time spent: 25 minutes.  Patrecia Pour, MD [facility] [email or web address] 08/19/2020, 5:10 PM

## 2020-08-19 NOTE — Progress Notes (Signed)
Physical Therapy Treatment Patient Details Name: [name]. MRN: [id number] DOB: 1941-06-29 Today's Date: 08/19/2020    History of Present Illness 79 y.o. male with medical history significant of peripheral neuropathy, diastolic dysfunction CHF, atrial fibrillation, BPH, coronary artery disease status post CABG in 2009, diabetes, hyperlipidemia, hypertension, obstructive sleep apnea, status post pacemaker placement, Lewy body dementia and pulmonary fibrosis who has had previous amputation of the right fourth toe and the left fourth and fifth toe presenting to the ER with right lower extremity swelling redness and pain.  Pt s/p Right 4th toe ampuation due to osteomyelitis on 08/17/20    PT Comments    Pt did not recall PWB status from yesterday so reviewed PWB status and wearing Darco wedge shoe for all mobility tasks.  Pt reports understanding.  Pt ambulated in hallway min/guard for safety however no physical assist required.     Follow Up Recommendations  Home health PT     Equipment Recommendations  None recommended by PT    Recommendations for Other Services       Precautions / Restrictions Precautions Precautions: Fall Restrictions Weight Bearing Restrictions: Yes RLE Weight Bearing: Partial weight bearing Other Position/Activity Restrictions: with Darco wedge shoe    Mobility  Bed Mobility Overal bed mobility: Needs Assistance Bed Mobility: Supine to Sit     Supine to sit: Supervision     General bed mobility comments: increased time and effort  Transfers Overall transfer level: Needs assistance Equipment used: Rolling walker (2 wheeled) Transfers: Sit to/from Stand Sit to Stand: Min guard         General transfer comment: verbal cues for UE and LE positioning, min/guard for safety  Ambulation/Gait Ambulation/Gait assistance: Min guard Gait Distance (Feet): 160 Feet Assistive device: Rolling walker (2 wheeled) Gait Pattern/deviations: Step-to  pattern;Antalgic     General Gait Details: verbal cues for sequence, step to for PWB (and wedge shoe creating LLD), maintained PWB well, cues for use of RW   Stairs             Wheelchair Mobility    Modified Rankin (Stroke Patients Only)       Balance                                            Cognition Arousal/Alertness: Awake/alert Behavior During Therapy: WFL for tasks assessed/performed Overall Cognitive Status: Within Functional Limits for tasks assessed                                        Exercises      General Comments        Pertinent Vitals/Pain Pain Assessment: No/denies pain    Home Living                      Prior Function            PT Goals (current goals can now be found in the care plan section) Progress towards PT goals: Progressing toward goals    Frequency    Min 3X/week      PT Plan Current plan remains appropriate    Co-evaluation              AM-PAC PT "6 Clicks" Mobility   Outcome Measure  Help needed turning from your back to your side while in a flat bed without using bedrails?: A Little Help needed moving from lying on your back to sitting on the side of a flat bed without using bedrails?: A Little Help needed moving to and from a bed to a chair (including a wheelchair)?: A Little Help needed standing up from a chair using your arms (e.g., wheelchair or bedside chair)?: A Little Help needed to walk in hospital room?: A Little Help needed climbing 3-5 steps with a railing? : A Little 6 Click Score: 18    End of Session Equipment Utilized During Treatment: Gait belt Activity Tolerance: Patient tolerated treatment well Patient left: in chair;with call bell/phone within reach;with chair alarm set Nurse Communication: Mobility status PT Visit Diagnosis: Difficulty in walking, not elsewhere classified (R26.2)     Time: 8421-0312 PT Time Calculation (min) (ACUTE  ONLY): 21 min  Charges:  $Gait Training: 8-22 mins                     Arlyce Dice, DPT Acute Rehabilitation Services Pager: [phone] Office: [phone]  [place] Ram E 08/19/2020, 12:41 PM

## 2020-08-19 NOTE — TOC Progression Note (Signed)
Transition of Care (TOC) - Progression Note    Patient Details  Name: [name]. MRN: [id number] Date of Birth: 04/04/1941  Transition of Care [facility]) CM/SW Contact  Shade Flood, LCSW Phone Number: 08/19/2020, 4:18 PM  Clinical Narrative:     TOC following. Pt will need HH at dc. Spoke with pt and his wife. They request [facility]. Will refer as requested. TOC will follow.  Expected Discharge Plan: [place] Barriers to Discharge: Continued Medical Work up  Expected Discharge Plan and Services Expected Discharge Plan: [place] In-house Referral: Clinical Social Work     Living arrangements for the past 2 months: Single Family Home                                       Social Determinants of Health (SDOH) Interventions    Readmission Risk Interventions Readmission Risk Prevention Plan 08/16/2020 05/26/2019  Transportation Screening Complete Complete  PCP or Specialist Appt within 3-5 Days - Complete  HRI or [place] - Complete  Social Work Consult for [place] Planning/Counseling - Complete  Palliative Care Screening Not Applicable Not Applicable  Medication Review [employer] photographer) Complete Complete  Some recent data might be hidden

## 2020-08-19 NOTE — Care Management Important Message (Signed)
Important Message  Patient Details IM Letter given to the Patient Name: [name]. MRN: [id number] Date of Birth: 1941-09-08   Medicare Important Message Given:  Yes     Kerin Salen 08/19/2020, 11:16 AM

## 2020-08-20 LAB — CBC WITH DIFFERENTIAL/PLATELET
Abs Immature Granulocytes: 0.04 10*3/uL (ref 0.00–0.07)
Basophils Absolute: 0 10*3/uL (ref 0.0–0.1)
Basophils Relative: 0 %
Eosinophils Absolute: 0.2 10*3/uL (ref 0.0–0.5)
Eosinophils Relative: 5 %
HCT: 27.8 % — ABNORMAL LOW (ref 39.0–52.0)
Hemoglobin: 9.1 g/dL — ABNORMAL LOW (ref 13.0–17.0)
Immature Granulocytes: 1 %
Lymphocytes Relative: 10 %
Lymphs Abs: 0.5 10*3/uL — ABNORMAL LOW (ref 0.7–4.0)
MCH: 31.1 pg (ref 26.0–34.0)
MCHC: 32.7 g/dL (ref 30.0–36.0)
MCV: 94.9 fL (ref 80.0–100.0)
Monocytes Absolute: 0.2 10*3/uL (ref 0.1–1.0)
Monocytes Relative: 4 %
Neutro Abs: 3.8 10*3/uL (ref 1.7–7.7)
Neutrophils Relative %: 80 %
Platelets: 128 10*3/uL — ABNORMAL LOW (ref 150–400)
RBC: 2.93 MIL/uL — ABNORMAL LOW (ref 4.22–5.81)
RDW: 14.5 % (ref 11.5–15.5)
WBC: 4.7 10*3/uL (ref 4.0–10.5)
nRBC: 0 % (ref 0.0–0.2)

## 2020-08-20 LAB — BASIC METABOLIC PANEL
Anion gap: 9 (ref 5–15)
BUN: 15 mg/dL (ref 8–23)
CO2: 24 mmol/L (ref 22–32)
Calcium: 8.5 mg/dL — ABNORMAL LOW (ref 8.9–10.3)
Chloride: 106 mmol/L (ref 98–111)
Creatinine, Ser: 0.87 mg/dL (ref 0.61–1.24)
GFR calc Af Amer: >60 mL/min (ref >60)
GFR calc non Af Amer: >60 mL/min (ref >60)
Glucose, Bld: 98 mg/dL (ref 70–99)
Potassium: 3.6 mmol/L (ref 3.5–5.1)
Sodium: 139 mmol/L (ref 135–145)

## 2020-08-20 LAB — MAGNESIUM: Magnesium: 2 mg/dL (ref 1.7–2.4)

## 2020-08-20 MED ORDER — CIPROFLOXACIN HCL 500 MG PO TABS: 500.0000 mg | ORAL_TABLET | Freq: Two times a day (BID) | ORAL | 0 refills | Status: AC

## 2020-08-20 NOTE — Progress Notes (Signed)
Physical Therapy Treatment Patient Details Name: [name]. MRN: [id number] DOB: 07-07-1941 Today's Date: 08/20/2020    History of Present Illness 79 y.o. male with medical history significant of peripheral neuropathy, diastolic dysfunction CHF, atrial fibrillation, BPH, coronary artery disease status post CABG in 2009, diabetes, hyperlipidemia, hypertension, obstructive sleep apnea, status post pacemaker placement, Lewy body dementia and pulmonary fibrosis who has had previous amputation of the right fourth toe and the left fourth and fifth toe presenting to the ER with right lower extremity swelling redness and pain.  Pt s/p Right 4th toe ampuation due to osteomyelitis on 08/17/20    PT Comments    Pt reports being nervous about d/c home.  Fearful of falling.  Pt has been steady with step to gait pattern and use of RW today.  Min/guard for safety and peace of mind for pt however no physical assist required.  Pt does require a little extra time for mobility but able to perform.  Pt also used bathroom and washed hands at sink prior to return to bed.  Reviewed safety with RW, maintaining Darco wedge shoe and PWB with mobilizing, and elevating R LE when at rest.  Pt likely to d/c home today.   Follow Up Recommendations  Home health PT     Equipment Recommendations  None recommended by PT    Recommendations for Other Services       Precautions / Restrictions Precautions Precautions: Fall Restrictions Weight Bearing Restrictions: Yes RLE Weight Bearing: Partial weight bearing Other Position/Activity Restrictions: with Darco wedge shoe    Mobility  Bed Mobility Overal bed mobility: Needs Assistance Bed Mobility: Supine to Sit;Sit to Supine     Supine to sit: Supervision Sit to supine: Supervision   General bed mobility comments: increased time and effort  Transfers Overall transfer level: Needs assistance Equipment used: Rolling walker (2 wheeled) Transfers: Sit to/from  Stand Sit to Stand: Min guard         General transfer comment: verbal cues for UE and LE positioning, increased time and effort  Ambulation/Gait Ambulation/Gait assistance: Min guard Gait Distance (Feet): 160 Feet Assistive device: Rolling walker (2 wheeled) Gait Pattern/deviations: Step-to pattern;Antalgic     General Gait Details: verbal cues for sequence, step to for PWB (and wedge shoe creating LLD), maintained PWB well, cues for remaining inside RW with turning   Stairs             Wheelchair Mobility    Modified Rankin (Stroke Patients Only)       Balance                                            Cognition Arousal/Alertness: Awake/alert Behavior During Therapy: WFL for tasks assessed/performed Overall Cognitive Status: Within Functional Limits for tasks assessed                                        Exercises      General Comments        Pertinent Vitals/Pain Pain Assessment: No/denies pain    Home Living                      Prior Function            PT Goals (current  goals can now be found in the care plan section) Progress towards PT goals: Progressing toward goals    Frequency    Min 3X/week      PT Plan Current plan remains appropriate    Co-evaluation              AM-PAC PT "6 Clicks" Mobility   Outcome Measure  Help needed turning from your back to your side while in a flat bed without using bedrails?: A Little Help needed moving from lying on your back to sitting on the side of a flat bed without using bedrails?: A Little Help needed moving to and from a bed to a chair (including a wheelchair)?: A Little Help needed standing up from a chair using your arms (e.g., wheelchair or bedside chair)?: A Little Help needed to walk in hospital room?: A Little Help needed climbing 3-5 steps with a railing? : A Little 6 Click Score: 18    End of Session Equipment Utilized  During Treatment: Gait belt Activity Tolerance: Patient tolerated treatment well Patient left: with call bell/phone within reach;in bed Nurse Communication: Mobility status PT Visit Diagnosis: Difficulty in walking, not elsewhere classified (R26.2)     Time: 1107-1140 PT Time Calculation (min) (ACUTE ONLY): 33 min  Charges:  $Gait Training: 23-37 mins                    Jannette Spanner PT, DPT Acute Rehabilitation Services Pager: [phone] Office: [phone]  [place] Ram E 08/20/2020, 12:16 PM

## 2020-08-20 NOTE — Progress Notes (Signed)
Pt to be discharged to home this afternoon. Discharge instructions including all Medications and schedules for these Medications reviewed with the Pt. Pt verbalized understanding of all discharge instructions. Discharge packet with Pt discharge i

## 2020-08-20 NOTE — Discharge Instructions (Signed)
Osteomyelitis, Adult  Bone infections (osteomyelitis) occur when bacteria or other germs get inside a bone. This can happen if you have an infection in another part of your body that spreads through your blood. Germs from your skin or from outside of your body can also cause this type of infection if you have a wound or a broken bone (fracture) that breaks the skin. Bone infections need to be treated quickly to prevent bone damage and to prevent the infection from spreading to other areas of your body. What are the causes? Most bone infections are caused by bacteria. They can also be caused by other germs, such as viruses and funguses. What increases the risk? You are more likely to develop this condition if you:  Recently had surgery, especially bone or joint surgery.  Have a long-term (chronic) disease, such as: ? Diabetes. ? HIV (human immunodeficiency virus). ? Rheumatoid arthritis. ? Sickle cell anemia. ? Kidney disease that requires dialysis.  Are aged 60 years or older.  Have a condition or take medicines that block or weaken your body's defense system (immune system).  Have a condition that reduces your blood flow.  Have an artificial joint.  Have had a joint or bone repaired with plates or screws (surgical hardware).  Use IV drugs.  Have a central line for IV access.  Have had trauma, such as stepping on a nail or a broken bone that came through the skin. What are the signs or symptoms? Symptoms vary depending on the type and location of your infection. Common symptoms of bone infections include:  Fever and chills.  Skin redness and warmth.  Swelling.  Pain and stiffness.  Drainage of fluid or pus near the infection. How is this diagnosed? This condition may be diagnosed based on:  Your symptoms and medical history.  A physical exam.  Tests, such as: ? A sample of tissue, fluid, or blood taken to be examined under a microscope. ? Pus or discharge swabbed  from a wound for testing to identify germs and to determine what type of medicine will kill them (culture and sensitivity). ? Blood tests.  Imaging studies. These may include: ? X-rays. ? MRI. ? CT scan. ? Bone scan. ? Ultrasound. How is this treated? Treatment for this condition depends on the cause and type of infection. Antibiotic medicines are usually the first treatment for a bone infection. This may be done in a hospital at first. You may have to continue IV antibiotics at home or take antibiotics by mouth for several weeks after that. Other treatments may include surgery to remove:  Dead or dying tissue from a bone.  An infected artificial joint.  Infected plates or screws that were used to repair a broken bone. Follow these instructions at home: Medicines   Take over-the-counter and prescription medicines only as told by your health care provider.  Take your antibiotic medicine as told by your health care provider. Do not stop taking the antibiotic even if you start to feel better.  Follow instructions from your health care provider about how to take IV antibiotics at home. You may need to have a nurse come to your home to give you the IV antibiotics. General instructions   Ask your health care provider if you have any restrictions on your activities.  If directed, put ice on the affected area: ? Put ice in a plastic bag. ? Place a towel between your skin and the bag. ? Leave the ice on for 20   minutes, 2-3 times a day.  Wash your hands often with soap and water. If soap and water are not available, use hand sanitizer.  Do not use any products that contain nicotine or tobacco, such as cigarettes and e-cigarettes. These can delay bone healing. If you need help quitting, ask your health care provider.  Keep all follow-up visits as told by your health care provider. This is important. Contact a health care provider if:  You develop a fever or chills.  You have  redness, warmth, pain, or swelling that returns after treatment. Get help right away if:  You have rapid breathing or you have trouble breathing.  You have chest pain.  You cannot drink fluids or make urine.  The affected area swells, changes color, or turns blue.  You have numbness or severe pain in the affected area. Summary  Bone infections (osteomyelitis) occur when bacteria or other germs get inside a bone.  You may be more likely to get this type of infection if you have a condition, such as diabetes, that lowers your ability to fight infection or increases your chances of getting an infection.  Most bone infections are caused by bacteria. They can also be caused by other germs, such as viruses and funguses.  Treatment for this condition usually starts with taking antibiotics. Further treatment depends on the cause and type of infection. This information is not intended to replace advice given to you by your health care provider. Make sure you discuss any questions you have with your health care provider. Document Revised: 12/20/2017 Document Reviewed: 12/13/2017 [employer] Patient Education  2020 [employer]  

## 2020-08-20 NOTE — Progress Notes (Signed)
Patient seen this AM. Incision looks well coapted. Sutures intact. No drainage. Dressings changed. From a surgical/podiatry standpoint patient okay to be discharged to home.   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 DOS: 08/17/2020  - patient evaluated with physical therapy present. Patient ambulating fine in IPOS shoe with assistance of a walker - okay to discharge from podiatry standpoint.  - Patient may weight bear in IPOS shoe.  - keep dressings clean, dry, and intact until f/u in office.  - recommend dc on oral antibiotics x 1 week - f/u in office next week   Edrick Kins, DPM [facility]  Dr. Edrick Kins, DPM    [address], [place] [address]                Office [phone]  Fax [phone]

## 2020-08-20 NOTE — Discharge Summary (Signed)
Physician Discharge Summary  [place]. ZMO:[id number] DOB: November 11, 1941 DOA: 08/13/2020  PCP: [name], MD  Admit date: 08/13/2020 Discharge date: 08/20/2020  Admitted From: Home Disposition: Home with home health care  Recommendations for Outpatient Follow-up:  1. Follow up with PCP in 1-2 weeks 2. Follow-up with podiatry within 1 week 3. Please obtain BMP/CBC in one week 4. Please follow up with your PCP on the following pending results: Unresulted Labs (From admission, onward)         None       Home Health: Yes Equipment/Devices: None  Discharge Condition: Stable CODE STATUS: Full code Diet recommendation: Cardiac  Subjective: Seen and examined.  No complaints.  Willing to go home if home health care can be set up.  Brief/Interim Summary: 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 Jannifer Franklin neurology history of [name]  Seen recently at podiatry office 08/10/2020 for right fourth toe wound which was red and swollen-had a hyperkeratotic lesion with granular wound and was recommended by Dr. Jacqualyn Posey for amputation at that time  He presented with early septic shock to the hospital on 8/28 likely secondary to presumed cellulitis/osteomyelitis but was able to resuscitate aggressively in the emergency room and was transferred to the progressive care.  He was started on broad-spectrum IV antibiotics for osteomyelitis of the fourth toe.  Podiatristperformed right fourth toe  amputation on 8/31.  He was continued on IV Rocephin and vancomycin until 08/20/2020/even after surgical amputation per podiatry recommendations.  His cellulitis has improved.  His surgical wound site looks well-healing and in great shape.  He was reevaluated by podiatry yesterday and although he was cleared for discharge from their standpoint and they recommended 1 more week of oral antibiotics however patient did not feel comfortable going home yesterday due to weakness.  He was kept in the hospital yesterday.  I assumed his care today.  He had no complaints when I saw him today.  He was completely alert, oriented and fully motivated.  Initially he was still concerned about going home due to weakness however he agreed upon going home if we can set up home health care for him which we reassured him and we did set that up.  He is being discharged in stable condition.  His surgical specimen did not grow anything and there is no other culture taken during this hospitalization however his wound culture that was taken in podiatry clinic outpatient on 07/29/2020 grew Pseudomonas and he was already on ciprofloxacin for that by his podiatry clinic so he is going to be discharged on ciprofloxacin for 7 more days as podiatry had recommended to continue oral antibiotics for 1 week.  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   Discharge disposition: 06-Home-Health Care Svc   Discharge patient date: 08/20/2020     Allergies as of 08/20/2020      Reactions   Aricept [donepezil] Other (See Comments)    Hallucination   Lipitor [atorvastatin] Other (See Comments)   Stiff joints   Nsaids Other (See Comments)   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   Warfarin And Related Other (See Comments)   Stiff joints   Enbrel [etanercept] Itching      Medication List    TAKE these medications   acetaminophen 325 MG tablet Commonly known as: TYLENOL Take 1-2 tablets (325-650 mg total) by mouth every 4 (four) hours as needed for mild pain.   allopurinol 300 MG tablet Commonly known as: ZYLOPRIM Take 1 tablet (300 mg total) by mouth daily.   ciprofloxacin 500 MG tablet Commonly known as: Cipro Take 1 tablet (500 mg total) by mouth 2 (two) times daily for 7 days.   docusate sodium 100 MG capsule Commonly known as: COLACE Take 1 capsule (100 mg total) by mouth 2 (two) times daily.   ferrous sulfate 325 (65 FE) MG tablet Take 1 tablet (325 mg total) by mouth daily with breakfast.   gabapentin 100 MG capsule Commonly known as: NEURONTIN Take 100 mg by mouth 3 (three) times daily.   methocarbamol 500 MG tablet Commonly known as: ROBAXIN Take 1 tablet (500 mg total) by mouth every 6 (six) hours as needed for muscle spasms.   metoprolol succinate 25 MG 24 hr tablet Commonly known as: TOPROL-XL Take 0.5 tablets (12.5 mg total) by mouth daily.   mirabegron ER 50 MG Tb24 tablet Commonly known as: Myrbetriq Take 1 tablet (50 mg total) by mouth daily.   modafinil 100 MG tablet Commonly known as: Provigil Take 0.5 tablets (50 mg total) by mouth daily.   multivitamin with minerals Tabs tablet Take 1 tablet by mouth daily.   pantoprazole 40 MG tablet Commonly known as: PROTONIX Take 1 tablet (40 mg total) by mouth daily.   polyethylene glycol 17 g packet Commonly known as: MIRALAX / GLYCOLAX Take 17 g by mouth daily as needed for mild constipation.   rosuvastatin 40 MG tablet Commonly known as: CRESTOR Take 1 tablet (40 mg total) by mouth daily.   sertraline 100 MG tablet Commonly known as:  ZOLOFT Take 2 tablets (200 mg total) by mouth daily.   tamsulosin 0.4 MG Caps capsule Commonly known as: Flomax Take 1 capsule (0.4 mg total) by mouth at bedtime.   torsemide 20 MG tablet Commonly known as: DEMADEX Take 2 tablets in the morning and 1 tablet in the afternoon Take additional dose as needed for leg swellings/shortness of breath /weight gain What changed:   how much to take  how to take this  when to take this  additional instructions       Follow-up Information    [name], MD. Schedule an appointment as soon as possible for a visit.   Specialty: Internal Medicine Contact information: [address] [place] [address] [phone]        Trula Slade, DPM Follow up.   Specialty: Podiatry Contact information: [address] [place] [address] [phone]        [name], MD Follow up in 1 week(s).   Specialty: Cardiology Contact information: [address] [place] [address]  [phone]              Allergies  Allergen Reactions  . Aricept [Donepezil] Other (See Comments)    Hallucination   . Lipitor [Atorvastatin] Other (See Comments)    Stiff joints  . 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 Warfarin And Related Other (See Comments)    Stiff joints  . Enbrel [Etanercept] Itching    Consultations: Podiatry   Procedures/Studies: DG Chest 2 View  Result Date: 08/05/2020 CLINICAL DATA:  Pulmonary fibrosis. EXAM: CHEST - 2 VIEW COMPARISON:  02/04/2020.  05/15/2019 CT 08/11/2019. FINDINGS: Cardiac pacer in stable position. Prior CABG. Stable cardiomegaly. Interstitial prominence again noted consistent with chronic interstitial lung disease. Similar findings on prior exam. No new focal alveolar infiltrate noted. No pleural effusion or pneumothorax. IMPRESSION: 1.  Cardiac pacer stable position.  Prior CABG.  Heart size stable. 2. Chronic  bilateral interstitial prominence again noted. Similar findings noted on prior exam. Findings consistent with patient's history of pulmonary fibrosis. No acute alveolar infiltrate noted. Electronically Signed   By: Marcello Moores  Register   On: 08/05/2020 11:19   DG Ankle Complete Right  Result Date: 08/14/2020 CLINICAL DATA:  Increased redness over the last couple days in the lower leg. EXAM: RIGHT ANKLE - COMPLETE 3+ VIEW COMPARISON:  None. FINDINGS: No fractures. Periostitis associated with the distal tibia, likely chronic. Diffuse soft tissue swelling identified. No other acute abnormalities. Degenerative changes in the midfoot. IMPRESSION: 1. Periostitis associated with the tibia is likely chronic. 2. Significant diffuse soft tissue swelling in the ankle. 3. No acute fractures.  No evidence of osteomyelitis. Electronically Signed   By: Dorise Bullion III M.D   On: 08/14/2020 10:20   DG Foot 2 Views Right  Result Date: 08/17/2020 CLINICAL DATA:  Fourth toe amputation.  Postoperative image EXAM: RIGHT FOOT - 2 VIEW COMPARISON:  08/14/2020 FINDINGS: Interval postsurgical changes from amputation of the fourth digit at the level of the fourth MTP joint. Patient has had prior second digit amputation at the level of the second MTP joint. Remaining osseous structures appear intact without fracture. No evidence of acute osteomyelitis. Chronic degenerative changes within the midfoot most prevalent at the naviculocuneiform joint. There are expected postoperative changes within the soft tissues at the amputation site. IMPRESSION: Interval postsurgical changes from amputation of the fourth digit at the level of the fourth MTP joint. Electronically Signed   By: Davina Poke D.O.   On: 08/17/2020 16:57   DG Foot Complete Right  Result Date: 08/14/2020 CLINICAL DATA:  Osteomyelitis of toe. Increased redness in the lower leg. EXAM: RIGHT FOOT COMPLETE - 3+ VIEW COMPARISON:  July 19, 2020 and August 10, 2020 FINDINGS:  Erosive changes in the tuft of the fourth toe persist. Second toes been amputated. The toes are otherwise normal. Osteoarthritic change in the midfoot. No other sites of osteomyelitis identified. No fractures. IMPRESSION: 1. Findings worrisome for osteomyelitis in the tuft of the distal phalanx of the right fourth toe. 2. Midfoot osteoarthritis. Electronically Signed   By: Dorise Bullion III M.D   On: 08/14/2020 10:18   DG Foot Complete Right  Result Date: 08/13/2020 Please see detailed radiograph report in office note.  VAS [place] LOWER EXTREMITY VENOUS (DVT)  Result Date: 08/16/2020  Lower Venous DVTStudy Indications: Swelling.  Risk Factors: None identified. Limitations: Poor ultrasound/tissue interface. Comparison Study: No prior studies. Performing Technologist: Oliver Hum RVT  Examination Guidelines: A complete evaluation includes  B-mode imaging, spectral Doppler, color Doppler, and power Doppler as needed of all accessible portions of each vessel. Bilateral testing is considered an integral part of a complete examination. Limited examinations for reoccurring indications may be performed as noted. The reflux portion of the exam is performed with the patient in reverse Trendelenburg.  +---------+---------------+---------+-----------+----------+--------------+ RIGHT    CompressibilityPhasicitySpontaneityPropertiesThrombus Aging +---------+---------------+---------+-----------+----------+--------------+ CFV      Full           Yes      Yes                                 +---------+---------------+---------+-----------+----------+--------------+ SFJ      Full                                                        +---------+---------------+---------+-----------+----------+--------------+ FV Prox  Full                                                        +---------+---------------+---------+-----------+----------+--------------+ FV Mid   Full                                                         +---------+---------------+---------+-----------+----------+--------------+ FV DistalFull                                                        +---------+---------------+---------+-----------+----------+--------------+ PFV      Full                                                        +---------+---------------+---------+-----------+----------+--------------+ POP      Full           Yes      Yes                                 +---------+---------------+---------+-----------+----------+--------------+ PTV      Full                                                        +---------+---------------+---------+-----------+----------+--------------+ PERO     Full                                                        +---------+---------------+---------+-----------+----------+--------------+   +----+---------------+---------+-----------+----------+--------------+  LEFTCompressibilityPhasicitySpontaneityPropertiesThrombus Aging +----+---------------+---------+-----------+----------+--------------+ CFV Full           Yes      Yes                                 +----+---------------+---------+-----------+----------+--------------+     Summary: RIGHT: - There is no evidence of deep vein thrombosis in the lower extremity.  - No cystic structure found in the popliteal fossa.  LEFT: - No evidence of common femoral vein obstruction.  *See table(s) above for measurements and observations. Electronically signed by Ruta Hinds MD on 08/16/2020 at 4:32:23 PM.    Final       Discharge Exam: Vitals:   08/20/20 0524 08/20/20 1014  BP: 114/71 115/74  Pulse: (!) 101 98  Resp: 16   Temp: 98.8 F (37.1 C)   SpO2: 93%    Vitals:   08/19/20 1308 08/19/20 2123 08/20/20 0524 08/20/20 1014  BP: 120/80 130/83 114/71 115/74  Pulse: (!) 107 (!) 103 (!) 101 98  Resp: 20 18 16    Temp: 98.6 F (37 C) 98.4 F (36.9 C) 98.8 F (37.1 C)   TempSrc:   Oral Oral   SpO2: 97% 95% 93%   Weight:      Height:        General: Pt is alert, awake, not in acute distress Cardiovascular: RRR, S1/S2 +, no rubs, no gallops Respiratory: CTA bilaterally, no wheezing, no rhonchi Abdominal: Soft, NT, ND, bowel sounds + Extremities: no edema, no cyanosis    The results of significant diagnostics from this hospitalization (including imaging, microbiology, ancillary and laboratory) are listed below for reference.     Microbiology: Recent Results (from the past 240 hour(s))  SARS Coronavirus 2 by RT PCR (hospital order, performed in [facility] hospital lab) Nasopharyngeal Nasopharyngeal Swab     Status: None   Collection Time: 08/13/20  8:12 PM   Specimen: Nasopharyngeal Swab  Result Value Ref Range Status   SARS Coronavirus 2 NEGATIVE NEGATIVE Final    Comment: (NOTE) SARS-CoV-2 target nucleic acids are NOT DETECTED.  The SARS-CoV-2 RNA is generally detectable in upper and lower respiratory specimens during the acute phase of infection. The lowest concentration of SARS-CoV-2 viral copies this assay can detect is 250 copies / mL. A negative result does not preclude SARS-CoV-2 infection and should not be used as the sole basis for treatment or other patient management decisions.  A negative result may occur with improper specimen collection / handling, submission of specimen other than nasopharyngeal swab, presence of viral mutation(s) within the areas targeted by this assay, and inadequate number of viral copies (<250 copies / mL). A negative result must be combined with clinical observations, patient history, and epidemiological information.  Fact Sheet for Patients:   StrictlyIdeas.no  Fact Sheet for Healthcare Providers: BankingDealers.co.za  This test is not yet approved or  cleared by the [place] FDA and has been authorized for detection and/or diagnosis of SARS-CoV-2 by FDA under  an Emergency Use Authorization (EUA).  This EUA will remain in effect (meaning this test can be used) for the duration of the COVID-19 declaration under Section 564(b)(1) of the Act, 21 U.S.C. section 360bbb-3(b)(1), unless the authorization is terminated or revoked sooner.  Performed at [facility], [place] [address], [place], [place] [address]   Surgical pcr screen     Status: None   Collection Time: 08/17/20  7:54 AM  Specimen: Nasal Mucosa; Nasal Swab  Result Value Ref Range Status   MRSA, PCR NEGATIVE NEGATIVE Final   Staphylococcus aureus NEGATIVE NEGATIVE Final    Comment: (NOTE) The Xpert SA Assay (FDA approved for NASAL specimens in patients 7 years of age and older), is one component of a comprehensive surveillance program. It is not intended to diagnose infection nor to guide or monitor treatment. Performed at [facility], [place] [address] [place], [place] [address]      Labs: BNP (last 3 results) No results for input(s): BNP in the last 8760 hours. Basic Metabolic Panel: Recent Labs  Lab 08/14/20 0428 08/14/20 0428 08/15/20 0618 08/15/20 0618 08/16/20 0636 08/17/20 0536 08/18/20 0538 08/19/20 0539 08/20/20 0757  NA 138  --  136  --  136 135  --   --  139  K 3.1*  --  3.3*  --  3.5 3.5  --   --  3.6  CL 101  --  104  --  105 103  --   --  106  CO2 26  --  26  --  23 23  --   --  24  GLUCOSE 106*  --  98  --  93 91  --   --  98  BUN 30*  --  24*  --  20 18  --   --  15  CREATININE 1.59*   < > 1.29*   < > 1.19 1.10 1.17 0.97 0.87  CALCIUM 8.5*  --  8.5*  --  8.4* 8.5*  --   --  8.5*  MG  --   --   --   --   --   --   --   --  2.0   < > = values in this interval not displayed.   Liver Function Tests: Recent Labs  Lab 08/13/20 1314 08/14/20 0428 08/15/20 0618 08/16/20 0636 08/17/20 0536  AST 45* 41 39 39 49*  ALT 32 27 24 25 30   ALKPHOS 89 61 64 74 100  BILITOT 1.6* 1.3* 0.9 0.7 0.8  PROT 8.4* 6.6 6.4* 6.4*  6.6  ALBUMIN 4.3 3.2* 2.9* 2.8* 2.9*   No results for input(s): LIPASE, AMYLASE in the last 168 hours. No results for input(s): AMMONIA in the last 168 hours. CBC: Recent Labs  Lab 08/13/20 1314 08/13/20 1314 08/14/20 0428 08/15/20 0618 08/16/20 0636 08/17/20 0536 08/20/20 0757  WBC 8.5   < > 7.1 4.5 4.4 3.9* 4.7  NEUTROABS 7.6  --   --  3.7 3.5 3.0 3.8  HGB 11.5*   < > 9.7* 8.5* 8.8* 8.5* 9.1*  HCT 35.1*   < > 29.3* 25.9* 26.9* 26.0* 27.8*  MCV 93.6   < > 94.8 94.2 94.7 94.9 94.9  PLT 96*   < > 75* 65* 79* 90* 128*   < > = values in this interval not displayed.   Cardiac Enzymes: No results for input(s): CKTOTAL, CKMB, CKMBINDEX, TROPONINI in the last 168 hours. BNP: Invalid input(s): POCBNP CBG: Recent Labs  Lab 08/17/20 1313  GLUCAP 90   D-Dimer No results for input(s): DDIMER in the last 72 hours. Hgb A1c No results for input(s): HGBA1C in the last 72 hours. Lipid Profile No results for input(s): CHOL, HDL, LDLCALC, TRIG, CHOLHDL, LDLDIRECT in the last 72 hours. Thyroid function studies No results for input(s): TSH, T4TOTAL, T3FREE, THYROIDAB in the last 72 hours.  Invalid input(s): FREET3 Anemia work up No results  for input(s): VITAMINB12, FOLATE, FERRITIN, TIBC, IRON, RETICCTPCT in the last 72 hours. Urinalysis    Component Value Date/Time   COLORURINE YELLOW 02/06/2020 0831   APPEARANCEUR CLEAR 02/06/2020 0831   LABSPEC 1.015 02/06/2020 0831   PHURINE 5.0 02/06/2020 0831   GLUCOSEU NEGATIVE 02/06/2020 0831   HGBUR NEGATIVE 02/06/2020 0831   BILIRUBINUR NEGATIVE 02/06/2020 0831   KETONESUR NEGATIVE 02/06/2020 0831   PROTEINUR NEGATIVE 02/06/2020 0831   UROBILINOGEN 0.2 01/07/2008 1137   NITRITE NEGATIVE 02/06/2020 0831   LEUKOCYTESUR NEGATIVE 02/06/2020 0831   Sepsis Labs Invalid input(s): PROCALCITONIN,  WBC,  LACTICIDVEN Microbiology Recent Results (from the past 240 hour(s))  SARS Coronavirus 2 by RT PCR (hospital order, performed in [facility] hospital lab) Nasopharyngeal Nasopharyngeal Swab     Status: None   Collection Time: 08/13/20  8:12 PM   Specimen: Nasopharyngeal Swab  Result Value Ref Range Status   SARS Coronavirus 2 NEGATIVE NEGATIVE Final    Comment: (NOTE) SARS-CoV-2 target nucleic acids are NOT DETECTED.  The SARS-CoV-2 RNA is generally detectable in upper and lower respiratory specimens during the acute phase of infection. The lowest concentration of SARS-CoV-2 viral copies this assay can detect is 250 copies / mL. A negative result does not preclude SARS-CoV-2 infection and should not be used as the sole basis for treatment or other patient management decisions.  A negative result may occur with improper specimen collection / handling, submission of specimen other than nasopharyngeal swab, presence of viral mutation(s) within the areas targeted by this assay, and inadequate number of viral copies (<250 copies / mL). A negative result must be combined with clinical observations, patient history, and epidemiological information.  Fact Sheet for Patients:   StrictlyIdeas.no  Fact Sheet for Healthcare Providers: BankingDealers.co.za  This test is not yet approved or  cleared by the [place] FDA and has been authorized for detection and/or diagnosis of SARS-CoV-2 by FDA under an Emergency Use Authorization (EUA).  This EUA will remain in effect (meaning this test can be used) for the duration of the COVID-19 declaration under Section 564(b)(1) of the Act, 21 U.S.C. section 360bbb-3(b)(1), unless the authorization is terminated or revoked sooner.  Performed at [facility], [place] [address] [place], [place] [address]   Surgical pcr screen     Status: None   Collection Time: 08/17/20  7:54 AM   Specimen: Nasal Mucosa; Nasal Swab  Result Value Ref Range Status   MRSA, PCR NEGATIVE NEGATIVE Final   Staphylococcus aureus NEGATIVE  NEGATIVE Final    Comment: (NOTE) The Xpert SA Assay (FDA approved for NASAL specimens in patients 50 years of age and older), is one component of a comprehensive surveillance program. It is not intended to diagnose infection nor to guide or monitor treatment. Performed at [facility], [place] [address], [place], [place] [address]      Time coordinating discharge: Over 30 minutes  SIGNED:   Darliss Cheney, MD  [facility] 08/20/2020, 10:19 AM  If 7PM-7AM, please contact night-coverage [email or web address]

## 2020-08-20 NOTE — TOC Transition Note (Signed)
Transition of Care [facility]) - CM/SW Discharge Note   Patient Details  Name: [name]. MRN: [id number] Date of Birth: Jun 09, 1941  Transition of Care [facility]) CM/SW Contact:  Lennart Pall, LCSW Phone Number: 08/20/2020, 12:01 PM   Clinical Narrative:    Pt cleared for dc today.  HH in place (see below).  No further TOC needs.   Final next level of care: [place] Barriers to Discharge: Barriers Resolved   Patient Goals and CMS Choice Patient states their goals for this hospitalization and ongoing recovery are:: go home CMS Medicare.gov Compare Post Acute Care list provided to:: Patient Choice offered to / list presented to : Patient  Discharge Placement                       Discharge Plan and Services In-house Referral: Clinical Social Work                        HH Arranged: PT [facility] Agency: [place] ([place]) Date [place]: 08/19/20 Time [place]: [place] Representative spoke with at [place]: [place] ([place]) Interventions     Readmission Risk Interventions Readmission Risk Prevention Plan 08/16/2020 05/26/2019  Transportation Screening Complete Complete  PCP or Specialist Appt within 3-5 Days - Complete  HRI or [place] - Complete  Social Work Consult for [place] Planning/Counseling - Complete  Palliative Care Screening Not Applicable Not Applicable  Medication Review Press photographer) Complete Complete  Some recent data might be hidden

## 2020-08-21 DIAGNOSIS — L03031 Cellulitis of right toe: Secondary | ICD-10-CM | POA: Diagnosis not present

## 2020-08-21 DIAGNOSIS — Z89421 Acquired absence of other right toe(s): Secondary | ICD-10-CM | POA: Diagnosis not present

## 2020-08-21 DIAGNOSIS — I480 Paroxysmal atrial fibrillation: Secondary | ICD-10-CM | POA: Diagnosis not present

## 2020-08-21 DIAGNOSIS — Z950 Presence of cardiac pacemaker: Secondary | ICD-10-CM | POA: Diagnosis not present

## 2020-08-21 DIAGNOSIS — F0281 Dementia in other diseases classified elsewhere with behavioral disturbance: Secondary | ICD-10-CM | POA: Diagnosis not present

## 2020-08-21 DIAGNOSIS — M6281 Muscle weakness (generalized): Secondary | ICD-10-CM | POA: Diagnosis not present

## 2020-08-21 DIAGNOSIS — G3183 Dementia with Lewy bodies: Secondary | ICD-10-CM | POA: Diagnosis not present

## 2020-08-21 DIAGNOSIS — G629 Polyneuropathy, unspecified: Secondary | ICD-10-CM | POA: Diagnosis not present

## 2020-08-25 DIAGNOSIS — G629 Polyneuropathy, unspecified: Secondary | ICD-10-CM | POA: Diagnosis not present

## 2020-08-25 DIAGNOSIS — M6281 Muscle weakness (generalized): Secondary | ICD-10-CM | POA: Diagnosis not present

## 2020-08-25 DIAGNOSIS — Z89421 Acquired absence of other right toe(s): Secondary | ICD-10-CM | POA: Diagnosis not present

## 2020-08-25 DIAGNOSIS — L03031 Cellulitis of right toe: Secondary | ICD-10-CM | POA: Diagnosis not present

## 2020-08-25 DIAGNOSIS — F0281 Dementia in other diseases classified elsewhere with behavioral disturbance: Secondary | ICD-10-CM | POA: Diagnosis not present

## 2020-08-25 DIAGNOSIS — G3183 Dementia with Lewy bodies: Secondary | ICD-10-CM | POA: Diagnosis not present

## 2020-08-27 DIAGNOSIS — M6281 Muscle weakness (generalized): Secondary | ICD-10-CM | POA: Diagnosis not present

## 2020-08-27 DIAGNOSIS — F0281 Dementia in other diseases classified elsewhere with behavioral disturbance: Secondary | ICD-10-CM | POA: Diagnosis not present

## 2020-08-27 DIAGNOSIS — G629 Polyneuropathy, unspecified: Secondary | ICD-10-CM | POA: Diagnosis not present

## 2020-08-27 DIAGNOSIS — L03031 Cellulitis of right toe: Secondary | ICD-10-CM | POA: Diagnosis not present

## 2020-08-27 DIAGNOSIS — G3183 Dementia with Lewy bodies: Secondary | ICD-10-CM | POA: Diagnosis not present

## 2020-08-27 DIAGNOSIS — Z89421 Acquired absence of other right toe(s): Secondary | ICD-10-CM | POA: Diagnosis not present

## 2020-08-30 ENCOUNTER — Other Ambulatory Visit: Payer: Self-pay

## 2020-08-30 ENCOUNTER — Ambulatory Visit (INDEPENDENT_AMBULATORY_CARE_PROVIDER_SITE_OTHER): Payer: Medicare Other | Admitting: Podiatry

## 2020-08-30 ENCOUNTER — Telehealth: Payer: Self-pay | Admitting: *Deleted

## 2020-08-30 ENCOUNTER — Ambulatory Visit (INDEPENDENT_AMBULATORY_CARE_PROVIDER_SITE_OTHER): Payer: Medicare Other

## 2020-08-30 DIAGNOSIS — M869 Osteomyelitis, unspecified: Secondary | ICD-10-CM | POA: Diagnosis not present

## 2020-08-30 DIAGNOSIS — L03031 Cellulitis of right toe: Secondary | ICD-10-CM

## 2020-08-30 NOTE — Telephone Encounter (Signed)
LMOM requesting call back to DC. Direct number and office hours provided.  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  On metoprolol. Pt asymptomatic per R. Charlcie Cradle, PA-C's OV note from 08/04/20.  Noted RV lead in high output mode, likely due to elevated V rates with AT.  Will arrange DC appointment for lead testing and reprogramming (turn RA/RV auto capture off).

## 2020-08-31 DIAGNOSIS — Z89421 Acquired absence of other right toe(s): Secondary | ICD-10-CM | POA: Diagnosis not present

## 2020-08-31 DIAGNOSIS — F0281 Dementia in other diseases classified elsewhere with behavioral disturbance: Secondary | ICD-10-CM | POA: Diagnosis not present

## 2020-08-31 DIAGNOSIS — G3183 Dementia with Lewy bodies: Secondary | ICD-10-CM | POA: Diagnosis not present

## 2020-08-31 DIAGNOSIS — M6281 Muscle weakness (generalized): Secondary | ICD-10-CM | POA: Diagnosis not present

## 2020-08-31 DIAGNOSIS — G629 Polyneuropathy, unspecified: Secondary | ICD-10-CM | POA: Diagnosis not present

## 2020-08-31 DIAGNOSIS — L03031 Cellulitis of right toe: Secondary | ICD-10-CM | POA: Diagnosis not present

## 2020-08-31 NOTE — Progress Notes (Signed)
Subjective: [name]. is a 79 y.o. is seen today in office s/p right fourth amputation preformed on 08/17/2020.  He is having no pain otherwise he has been doing great.  His wife states he has been doing well. denies any systemic complaints such as fevers, chills, nausea, vomiting. No calf pain, chest pain, shortness of breath.   Objective: General: No acute distress, AAOx3  DP/PT pulses palpable 2/4, CRT < 3 sec to all digits.  Right foot: Incision is well coapted without any evidence of dehiscence with sutures intact. There is no surrounding erythema, ascending cellulitis, fluctuance, crepitus, malodor, drainage/purulence. There is trace edema around the surgical site. There is no pain along the surgical site.  Chronic venous insufficiency changes present bilaterally No other areas of tenderness to bilateral lower extremities.  No other open lesions or pre-ulcerative lesions.  No pain with calf compression, swelling, warmth, erythema.   Assessment and Plan:  Status post status post right fourth amputation, doing well with no complications   -Treatment options discussed including all alternatives, risks, and complications -Sutures removed without complications the incision remained well coapted.  Skin was cleaned.  Antibiotic ointment was applied followed by dressing.  He can remove the bandage tomorrow and start to shower.  Dry thoroughly and apply a similar bandage. -Recommend Darco wedge shoe -Monitor for any clinical signs or symptoms of infection and DVT/PE and directed to call the office immediately should any occur or go to the ER. -Follow-up as scheduled or sooner if any problems arise. In the meantime, encouraged to call the office with any questions, concerns, change in symptoms.   Celesta Gentile, DPM

## 2020-09-01 NOTE — Telephone Encounter (Signed)
Patient scheduled for DC appointment to assess leads and for reprogramming if needed on 09/07/20.

## 2020-09-03 DIAGNOSIS — G3183 Dementia with Lewy bodies: Secondary | ICD-10-CM | POA: Diagnosis not present

## 2020-09-03 DIAGNOSIS — M6281 Muscle weakness (generalized): Secondary | ICD-10-CM | POA: Diagnosis not present

## 2020-09-03 DIAGNOSIS — F0281 Dementia in other diseases classified elsewhere with behavioral disturbance: Secondary | ICD-10-CM | POA: Diagnosis not present

## 2020-09-03 DIAGNOSIS — G629 Polyneuropathy, unspecified: Secondary | ICD-10-CM | POA: Diagnosis not present

## 2020-09-03 DIAGNOSIS — L03031 Cellulitis of right toe: Secondary | ICD-10-CM | POA: Diagnosis not present

## 2020-09-03 DIAGNOSIS — Z89421 Acquired absence of other right toe(s): Secondary | ICD-10-CM | POA: Diagnosis not present

## 2020-09-07 ENCOUNTER — Other Ambulatory Visit: Payer: Self-pay

## 2020-09-07 ENCOUNTER — Ambulatory Visit (INDEPENDENT_AMBULATORY_CARE_PROVIDER_SITE_OTHER): Payer: Medicare Other | Admitting: Emergency Medicine

## 2020-09-07 DIAGNOSIS — Z23 Encounter for immunization: Secondary | ICD-10-CM | POA: Diagnosis not present

## 2020-09-07 DIAGNOSIS — Z950 Presence of cardiac pacemaker: Secondary | ICD-10-CM

## 2020-09-07 DIAGNOSIS — E1121 Type 2 diabetes mellitus with diabetic nephropathy: Secondary | ICD-10-CM | POA: Diagnosis not present

## 2020-09-07 DIAGNOSIS — I495 Sick sinus syndrome: Secondary | ICD-10-CM | POA: Diagnosis not present

## 2020-09-07 DIAGNOSIS — R001 Bradycardia, unspecified: Secondary | ICD-10-CM | POA: Diagnosis not present

## 2020-09-07 DIAGNOSIS — I5032 Chronic diastolic (congestive) heart failure: Secondary | ICD-10-CM | POA: Diagnosis not present

## 2020-09-07 DIAGNOSIS — Z89429 Acquired absence of other toe(s), unspecified side: Secondary | ICD-10-CM | POA: Diagnosis not present

## 2020-09-07 DIAGNOSIS — E7800 Pure hypercholesterolemia, unspecified: Secondary | ICD-10-CM | POA: Diagnosis not present

## 2020-09-07 DIAGNOSIS — I10 Essential (primary) hypertension: Secondary | ICD-10-CM | POA: Diagnosis not present

## 2020-09-07 NOTE — Patient Instructions (Signed)
You will receive a call for a follow-up with Dr Lovena Le or one of his Physician assistants.

## 2020-09-08 ENCOUNTER — Ambulatory Visit (INDEPENDENT_AMBULATORY_CARE_PROVIDER_SITE_OTHER): Payer: Medicare Other | Admitting: Adult Health

## 2020-09-08 ENCOUNTER — Encounter: Payer: Self-pay | Admitting: Adult Health

## 2020-09-08 VITALS — BP 112/74 | HR 91 | Ht 68.0 in | Wt 174.8 lb

## 2020-09-08 DIAGNOSIS — Z9989 Dependence on other enabling machines and devices: Secondary | ICD-10-CM

## 2020-09-08 DIAGNOSIS — I2510 Atherosclerotic heart disease of native coronary artery without angina pectoris: Secondary | ICD-10-CM | POA: Diagnosis not present

## 2020-09-08 DIAGNOSIS — G4711 Idiopathic hypersomnia with long sleep time: Secondary | ICD-10-CM | POA: Diagnosis not present

## 2020-09-08 DIAGNOSIS — G4733 Obstructive sleep apnea (adult) (pediatric): Secondary | ICD-10-CM

## 2020-09-08 MED ORDER — MODAFINIL 100 MG PO TABS: 50.0000 mg | ORAL_TABLET | Freq: Every day | ORAL | 1 refills | Status: DC

## 2020-09-08 NOTE — Patient Instructions (Signed)
Your Plan:  Continue CPAP  Try Modafinil 1/2 tablet (50 mg ) can increase to 1 tablet (100 mg) if no benefit with 50 mg.  If your symptoms worsen or you develop new symptoms please let us know.    Thank you for coming to see [place] at [facility]. I hope we have been able to provide you high quality care today.  You may receive a patient satisfaction survey over the next few weeks. We would appreciate your feedback and comments so that we may continue to improve ourselves and the health of our patients.

## 2020-09-08 NOTE — Progress Notes (Signed)
PATIENT: [name]. DOB: 1941/03/31  REASON FOR VISIT: follow up HISTORY FROM: patient  HISTORY OF PRESENT ILLNESS: Today 09/08/20:  [name] is a 79 year old male with a history of obstructive sleep apnea on CPAP.  His download indicates that he uses machine 21 out of 30 days for compliance of 70%.  He uses machine greater than 4 hours 19 days for compliance of 63%.  On average he uses his machine 6 hours and 33 minutes.  His residual AHI is 3.6 on 6 to 16 cm of water with EPR of 3.  Leak in the 95th percentile is 43.2 L/min.  He reports that he just was able to get modafinil last night.  He has not taken a dose yet.  HISTORY Patient CPAP download indicates that he did use his machine nightly for compliance of 100%. He uses machine greater than 4 hours 16 days for compliance of 53%. On average he uses his machine 4 hours and 35 minutes. His residual AHI is 3.7 on 6 to 16 cm of water with EPR of 3. Leak in the 95th percentile is 29.6 L/min.  The patient and his wife state that he continues to nap throughout the day. Wife reports that he typically goes back to bed around 2 PM and will not get up to 5 PM. Reports that he has several naps throughout the day. Patient reports that he would like to be more awake during the day. He is interested in trying medication  REVIEW OF SYSTEMS: Out of a complete 14 system review of symptoms, the patient complains only of the following symptoms, and all other reviewed systems are negative.  See HPI  ALLERGIES: Allergies  Allergen Reactions  . Aricept [Donepezil] Other (See Comments)    Hallucination   . Lipitor [Atorvastatin] Other (See Comments)    Stiff joints  . 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 Warfarin And Related Other (See Comments)    Stiff joints  . Enbrel [Etanercept] Itching    HOME MEDICATIONS: Outpatient Medications Prior to Visit  Medication Sig Dispense Refill  . acetaminophen (TYLENOL)  325 MG tablet Take 1-2 tablets (325-650 mg total) by mouth every 4 (four) hours as needed for mild pain.    [name] allopurinol (ZYLOPRIM) 300 MG tablet Take 1 tablet (300 mg total) by mouth daily. 30 tablet 0  . ferrous sulfate 325 (65 FE) MG tablet Take 1 tablet (325 mg total) by mouth daily with breakfast. 30 tablet 3  . metoprolol succinate (TOPROL-XL) 25 MG 24 hr tablet Take 0.5 tablets (12.5 mg total) by mouth daily. 30 tablet 0  . mirabegron ER (MYRBETRIQ) 50 MG TB24 tablet Take 1 tablet (50 mg total) by mouth daily. 30 tablet 3  . Multiple Vitamin (MULTIVITAMIN WITH MINERALS) TABS tablet Take 1 tablet by mouth daily.    . pantoprazole (PROTONIX) 40 MG tablet Take 1 tablet (40 mg total) by mouth daily. 30 tablet 0  . rosuvastatin (CRESTOR) 40 MG tablet Take 1 tablet (40 mg total) by mouth daily. 90 tablet 2  . sertraline (ZOLOFT) 100 MG tablet Take 2 tablets (200 mg total) by mouth daily. 30 tablet 0  . tamsulosin (FLOMAX) 0.4 MG CAPS capsule Take 1 capsule (0.4 mg total) by mouth at bedtime. 30 capsule 1  . torsemide (DEMADEX) 20 MG tablet Take 2 tablets in the morning and 1 tablet in the afternoon Take additional dose as needed for  leg swellings/shortness of breath /weight gain (Patient taking differently: Take 20-40 mg by mouth See admin instructions. Take 40 mg by mouth in the morning and 20 mg in the afternoon Take additional dose as needed for leg swellings/shortness of breath /weight gain)    . docusate sodium (COLACE) 100 MG capsule Take 1 capsule (100 mg total) by mouth 2 (two) times daily. 10 capsule 0  . gabapentin (NEURONTIN) 100 MG capsule Take 100 mg by mouth 3 (three) times daily.     . methocarbamol (ROBAXIN) 500 MG tablet Take 1 tablet (500 mg total) by mouth every 6 (six) hours as needed for muscle spasms. 60 tablet 0  . modafinil (PROVIGIL) 100 MG tablet Take 0.5 tablets (50 mg total) by mouth daily. 30 tablet 2  . polyethylene glycol (MIRALAX / GLYCOLAX) 17 g packet Take 17 g by  mouth daily as needed for mild constipation. 14 each 0   No facility-administered medications prior to visit.    PAST MEDICAL HISTORY: Past Medical History:  Diagnosis Date  . (HFpEF) heart failure with preserved ejection fraction ([place])    a. 05/2013 Echo: EF 55%, mild LVH, diast dysfxn, Ao sclerosis, mildly dil LA, RV dysfxn (poorly visualized), PASP 12mmHg;  b. 03/2017 Echo: EF 55-60%, no rwma, triv MR, mildly dil RV, mod TR, PASP 59mmHg.  . Atrial fibrillation [facility])    s/p Cox Maze 1/09;  Multaq Rx d/c'd in 2014 due to pulmo fibrosis;  coumadin d/c'd in 2014 due to spontaneous subdural hematoma  . BPH (benign prostatic hyperplasia)   . CAD (coronary artery disease), native coronary artery    a. s/p CABG 12/2007;  b. Myoview 12/2011: EF 66%, no scar or ischemia; normal.  . DM (diabetes mellitus) ([place])   . Hyperlipidemia type II   . Hypertension   . 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 OSA (obstructive sleep apnea)   . Pacemaker    PPM - St. Jude  . Peripheral neuropathy 07/31/2018  . Pulmonary fibrosis ([place])    Multaq d/c'd 7/14  . Subdural hematoma ([place]) 07/2012   spontaneous;  coumadin d/c'd => no longer a candidate for anticoagulation    PAST SURGICAL HISTORY: Past Surgical History:  Procedure Laterality Date  . AMPUTATION Left 05/17/2019   Procedure: AMPUTATION LEFT FOURTH TOE;  Surgeon: Meredith Pel, MD;  Location: [place];  Service: Orthopedics;  Laterality: Left;  . AMPUTATION TOE Right 02/06/2020   Procedure: AMPUTATION TOE;  Surgeon: Newt Minion, MD;  Location: [place];  Service: Orthopedics;  Laterality: Right;  . AMPUTATION TOE Right 08/17/2020   Procedure: AMPUTATION TOE 4th toe;  Surgeon: Trula Slade, DPM;  Location: [facility];  Service: Podiatry;  Laterality: Right;  . APPENDECTOMY    . CHOLECYSTECTOMY    . CORONARY ARTERY BYPASS GRAFT     x3 (left internal mammary artery to distal left anterior descending coronary artery,  saphenous vain graft to second circumflex marginal branch, saphenous vain graft to posterior descending coronary artery, endoscopic saphenous vain harvest from right thigh) and modified Cox - Maze IV procedure.  [name],MD. Electronically signed CHO/MEDQ D: 01/09/2008/ JOB: [id number] cc:  [name]  . [name]  07/30/2012   Procedure: CRANIOTOMY HEMATOMA EVACUATION SUBDURAL;  Surgeon: [name], MD;  Location: [place] [facility];  Service: Neurosurgery;  Laterality: Right;  Right craniotomy for evacuation of subdural hematoma  . FOOT SURGERY    . HERNIA REPAIR    .  INTRAMEDULLARY (IM) NAIL INTERTROCHANTERIC Right 02/04/2020   Procedure: INTRAMEDULLARY (IM) NAIL INTERTROCHANTRIC;  Surgeon: Netta Cedars, MD;  Location: [place];  Service: Orthopedics;  Laterality: Right;  . ORCHIECTOMY     Left  /  testicular cancer  . PACEMAKER PLACEMENT     PPM - St. Jude  . PPM GENERATOR CHANGEOUT N/A 06/25/2019   Procedure: PPM GENERATOR CHANGEOUT;  Surgeon: Evans Lance, MD;  Location: [place] CV LAB;  Service: Cardiovascular;  Laterality: N/A;    FAMILY HISTORY: Family History  Problem Relation Age of Onset  . Heart disease Father   . Heart attack Father   . Heart failure Father   . Heart disease Mother   . Alzheimer's disease Mother   . Dementia Mother     SOCIAL HISTORY: Social History   Socioeconomic History  . Marital status: Married    Spouse name: [name]  . Number of children: 2  . Years of education: Not on file  . Highest education level: Not on file  Occupational History  . Occupation: Retired- IT trainer  Tobacco Use  . Smoking status: Former Smoker    Packs/day: 2.00    Years: 20.00    Pack years: 40.00    Types: Cigarettes    Quit date: 02/21/1991    Years since quitting: 29.5  . Smokeless tobacco: Never Used  Vaping Use  . Vaping Use: Never used  Substance and Sexual Activity  . Alcohol use: No    Alcohol/week: 0.0 standard drinks  . Drug use: No  .  Sexual activity: Not Currently  Other Topics Concern  . Not on file  Social History Narrative   Lives with wife   Right handed    Married with two children.     He is a Engineer, structural.     Social Determinants of Health   Financial Resource Strain:   . Difficulty of Paying Living Expenses: Not on file  Food Insecurity:   . Worried About Charity fundraiser in the Last Year: Not on file  . Ran Out of Food in the Last Year: Not on file  Transportation Needs:   . Lack of Transportation (Medical): Not on file  . Lack of Transportation (Non-Medical): Not on file  Physical Activity:   . Days of Exercise per Week: Not on file  . Minutes of Exercise per Session: Not on file  Stress:   . Feeling of Stress : Not on file  Social Connections:   . Frequency of Communication with Friends and Family: Not on file  . Frequency of Social Gatherings with Friends and Family: Not on file  . Attends Religious Services: Not on file  . Active Member of Clubs or Organizations: Not on file  . Attends Archivist Meetings: Not on file  . Marital Status: Not on file  Intimate Partner Violence:   . Fear of Current or Ex-Partner: Not on file  . Emotionally Abused: Not on file  . Physically Abused: Not on file  . Sexually Abused: Not on file      PHYSICAL EXAM  Vitals:   09/08/20 0819  BP: 112/74  Pulse: 91  Weight: 174 lb 12.8 oz (79.3 kg)  Height: 5\' 8"  (1.727 m)   Body mass index is 26.58 kg/m.  Generalized: Well developed, in no acute distress  Chest: Lungs clear to auscultation bilaterally  Neurological examination  Mentation: Alert oriented to time, place, history taking. Follows all commands speech and language fluent  Cranial nerve II-XII: Extraocular movements were full, visual field were full on confrontational test Head turning and shoulder shrug  were normal and symmetric. Motor: The motor testing reveals 5 over 5 strength of all 4 extremities. Good symmetric motor tone  is noted throughout.  Sensory: Sensory testing is intact to soft touch on all 4 extremities. No evidence of extinction is noted.  Gait and station: Uses a Rollator when ambulating.boot on the right foot Due to toe amputation    DIAGNOSTIC DATA (LABS, IMAGING, TESTING) - I reviewed patient records, labs, notes, testing and imaging myself where available.  Lab Results  Component Value Date   WBC 4.7 08/20/2020   HGB 9.1 (L) 08/20/2020   HCT 27.8 (L) 08/20/2020   MCV 94.9 08/20/2020   PLT 128 (L) 08/20/2020      Component Value Date/Time   NA 139 08/20/2020 0757   NA 141 04/21/2020 1628   NA 141 02/23/2017 1237   K 3.6 08/20/2020 0757   K 3.8 02/23/2017 1237   CL 106 08/20/2020 0757   CO2 24 08/20/2020 0757   CO2 27 02/23/2017 1237   GLUCOSE 98 08/20/2020 0757   GLUCOSE 113 02/23/2017 1237   BUN 15 08/20/2020 0757   BUN 25 04/21/2020 1628   BUN 18.3 02/23/2017 1237   CREATININE 0.87 08/20/2020 0757   CREATININE 1.2 02/23/2017 1237   CALCIUM 8.5 (L) 08/20/2020 0757   CALCIUM 9.6 02/23/2017 1237   PROT 6.6 08/17/2020 0536   PROT 7.3 06/18/2018 1205   PROT 8.0 02/23/2017 1237   ALBUMIN 2.9 (L) 08/17/2020 0536   ALBUMIN 4.1 09/24/2017 0824   ALBUMIN 4.1 02/23/2017 1237   AST 49 (H) 08/17/2020 0536   AST 24 02/23/2017 1237   ALT 30 08/17/2020 0536   ALT 15 02/23/2017 1237   ALKPHOS 100 08/17/2020 0536   ALKPHOS 105 02/23/2017 1237   BILITOT 0.8 08/17/2020 0536   BILITOT 0.6 09/24/2017 0824   BILITOT 0.98 02/23/2017 1237   GFRNONAA >60 08/20/2020 0757   GFRAA >60 08/20/2020 0757   Lab Results  Component Value Date   CHOL 103 05/22/2019   HDL 40 (L) 05/22/2019   LDLCALC 51 05/22/2019   LDLDIRECT 154.2 10/20/2013   TRIG 58 05/22/2019   CHOLHDL 2.6 05/22/2019   Lab Results  Component Value Date   HGBA1C 5.6 05/21/2019   Lab Results  Component Value Date   VITAMINB12 481 11/07/2019   Lab Results  Component Value Date   TSH 2.022 05/16/2019       ASSESSMENT AND PLAN 79 y.o. year old male  has a past medical history of (HFpEF) heart failure with preserved ejection fraction ([place]), Atrial fibrillation ([place]), BPH (benign prostatic hyperplasia), CAD (coronary artery disease), native coronary artery, DM (diabetes mellitus) ([place]), Hyperlipidemia type II, Hypertension, [name] (monoclonal gammopathy of unknown significance) (07/31/2018), OSA (obstructive sleep apnea), Pacemaker, Peripheral neuropathy (07/31/2018), Pulmonary fibrosis ([place]), and Subdural hematoma ([place]) (07/2012). here with:  Obstructive sleep apnea on CPAP   Download indicates good compliance  Good treatment of apnea  Encourage patient continue using CPAP nightly and greater than 4 hours each night  Idiopathic hypersomnia   Start modafinil 50 mg daily if this is not beneficial we can increase to 100 mg daily  I spent 25 minutes of face-to-face and non-face-to-face time with patient.  This included previsit chart review, lab review, study review, order entry, electronic health record documentation, patient education.  Ward Givens, MSN, NP-C 09/08/2020, 9:10 AM [facility]  [address], [place], [place] [address] [phone]

## 2020-09-09 DIAGNOSIS — M6281 Muscle weakness (generalized): Secondary | ICD-10-CM | POA: Diagnosis not present

## 2020-09-09 DIAGNOSIS — G629 Polyneuropathy, unspecified: Secondary | ICD-10-CM | POA: Diagnosis not present

## 2020-09-09 DIAGNOSIS — L03031 Cellulitis of right toe: Secondary | ICD-10-CM | POA: Diagnosis not present

## 2020-09-09 DIAGNOSIS — G3183 Dementia with Lewy bodies: Secondary | ICD-10-CM | POA: Diagnosis not present

## 2020-09-09 DIAGNOSIS — F0281 Dementia in other diseases classified elsewhere with behavioral disturbance: Secondary | ICD-10-CM | POA: Diagnosis not present

## 2020-09-09 DIAGNOSIS — Z89421 Acquired absence of other right toe(s): Secondary | ICD-10-CM | POA: Diagnosis not present

## 2020-09-09 LAB — CUP PACEART INCLINIC DEVICE CHECK
Brady Statistic RA Percent Paced: 1 %
Brady Statistic RV Percent Paced: 2.8 %
Date Time Interrogation Session: 20210921140037
Implantable Lead Implant Date: 20090420
Implantable Lead Implant Date: 20090420
Implantable Lead Location: 753859
Implantable Lead Location: 753860
Implantable Pulse Generator Implant Date: 20200708
Lead Channel Pacing Threshold Amplitude: 0.75 V
Lead Channel Pacing Threshold Amplitude: 1 V
Lead Channel Pacing Threshold Pulse Width: 0.4 ms
Lead Channel Pacing Threshold Pulse Width: 0.4 ms
Lead Channel Sensing Intrinsic Amplitude: 1 mV
Lead Channel Sensing Intrinsic Amplitude: 7.2 mV
Pulse Gen Model: 2272
Pulse Gen Serial Number: 9149180

## 2020-09-09 NOTE — Progress Notes (Signed)
Pacemaker check in clinic due to alert for elevated RV output. Normal device function. Thresholds, sensing, impedances consistent with previous measurements. Device programmed to maximize longevity.  AT/AF burden < 1%, 328 AMS episodes with 6 EGMs that show AT. 4 high ventricular rates noted with EGMS that show 1:1 SVT. Device programmed at appropriate safety margins. Histogram distribution appropriate for patient activity level. Device programmed to optimize intrinsic conduction. Estimated longevity 10.4 years. Patient education completed.

## 2020-09-12 ENCOUNTER — Other Ambulatory Visit: Payer: Self-pay | Admitting: Cardiology

## 2020-09-12 DIAGNOSIS — I50812 Chronic right heart failure: Secondary | ICD-10-CM

## 2020-09-14 ENCOUNTER — Ambulatory Visit (INDEPENDENT_AMBULATORY_CARE_PROVIDER_SITE_OTHER): Payer: Medicare Other | Admitting: Podiatry

## 2020-09-14 ENCOUNTER — Other Ambulatory Visit: Payer: Self-pay

## 2020-09-14 DIAGNOSIS — M869 Osteomyelitis, unspecified: Secondary | ICD-10-CM

## 2020-09-14 DIAGNOSIS — L03031 Cellulitis of right toe: Secondary | ICD-10-CM

## 2020-09-16 DIAGNOSIS — G629 Polyneuropathy, unspecified: Secondary | ICD-10-CM | POA: Diagnosis not present

## 2020-09-16 DIAGNOSIS — Z89421 Acquired absence of other right toe(s): Secondary | ICD-10-CM | POA: Diagnosis not present

## 2020-09-16 DIAGNOSIS — G3183 Dementia with Lewy bodies: Secondary | ICD-10-CM | POA: Diagnosis not present

## 2020-09-16 DIAGNOSIS — F0281 Dementia in other diseases classified elsewhere with behavioral disturbance: Secondary | ICD-10-CM | POA: Diagnosis not present

## 2020-09-16 DIAGNOSIS — L03031 Cellulitis of right toe: Secondary | ICD-10-CM | POA: Diagnosis not present

## 2020-09-16 DIAGNOSIS — M6281 Muscle weakness (generalized): Secondary | ICD-10-CM | POA: Diagnosis not present

## 2020-09-16 NOTE — Progress Notes (Signed)
Subjective: [name]. is a 79 y.o. is seen today in office s/p right fourth amputation preformed on 08/17/2020.  He states he has been doing well.  He also however states he has been keeping a liquid Band-Aid on the incision since I last saw him.  Still in the surgical shoe.  Denies any drainage or pus or increasing swelling or redness.  Denies any fevers, chills, nausea, vomiting.  No calf pain, chest pain, shortness of breath.  No other concerns today.   Objective: General: No acute distress, AAOx3  DP/PT pulses palpable 2/4, CRT < 3 sec to all digits.  Right foot: Incision is well coapted without any evidence of dehiscence.  There is macerated tissue adjacent to the incision a small area of skin breakdown on the sulcus of the fifth toe just adjacent to the incision but the actual incision is healed.  There is no significant edema there is no erythema or warmth there is no drainage or pus or any signs of infection. No other open lesions or pre-ulcerative lesions.  No pain with calf compression, swelling, warmth, erythema.   Assessment and Plan:  Status post status post right fourth amputation, doing well with no complications   -Treatment options discussed including all alternatives, risks, and complications -Recommend him not to use the liquid Band-Aid.  I applied His Pain Today.  I Recommend a Small Amount of Betadine to the Area to Help Dry up the Macerated Tissue and Prevent Further Skin Breakdown.  Discussed Daily Dressing Changes.  Continue with Surgical Shoe, Elevation. -Monitor for any clinical signs or symptoms of infection and directed to call the office immediately should any occur or go to the ER.  Return in about 2 weeks (around 09/28/2020).  Trula Slade DPM

## 2020-09-17 DIAGNOSIS — D631 Anemia in chronic kidney disease: Secondary | ICD-10-CM | POA: Diagnosis not present

## 2020-09-17 DIAGNOSIS — I129 Hypertensive chronic kidney disease with stage 1 through stage 4 chronic kidney disease, or unspecified chronic kidney disease: Secondary | ICD-10-CM | POA: Diagnosis not present

## 2020-09-17 DIAGNOSIS — N1831 Chronic kidney disease, stage 3a: Secondary | ICD-10-CM | POA: Diagnosis not present

## 2020-09-17 DIAGNOSIS — N189 Chronic kidney disease, unspecified: Secondary | ICD-10-CM | POA: Diagnosis not present

## 2020-09-17 DIAGNOSIS — D472 Monoclonal gammopathy: Secondary | ICD-10-CM | POA: Diagnosis not present

## 2020-09-20 ENCOUNTER — Other Ambulatory Visit: Payer: Self-pay | Admitting: Nephrology

## 2020-09-20 DIAGNOSIS — L03031 Cellulitis of right toe: Secondary | ICD-10-CM | POA: Diagnosis not present

## 2020-09-20 DIAGNOSIS — N1831 Chronic kidney disease, stage 3a: Secondary | ICD-10-CM

## 2020-09-21 ENCOUNTER — Ambulatory Visit (INDEPENDENT_AMBULATORY_CARE_PROVIDER_SITE_OTHER): Payer: Medicare Other

## 2020-09-21 DIAGNOSIS — I495 Sick sinus syndrome: Secondary | ICD-10-CM

## 2020-09-21 LAB — CUP PACEART REMOTE DEVICE CHECK
Battery Remaining Longevity: 120 mo
Battery Remaining Percentage: 95.5 %
Battery Voltage: 3.01 V
Brady Statistic AP VP Percent: 2.4 %
Brady Statistic AP VS Percent: 1 %
Brady Statistic AS VP Percent: 1 %
Brady Statistic AS VS Percent: 94 %
Brady Statistic RA Percent Paced: 2.6 %
Brady Statistic RV Percent Paced: 2.5 %
Date Time Interrogation Session: 20211005020012
Implantable Lead Implant Date: 20090420
Implantable Lead Implant Date: 20090420
Implantable Lead Location: 753859
Implantable Lead Location: 753860
Implantable Pulse Generator Implant Date: 20200708
Lead Channel Impedance Value: 480 Ohm
Lead Channel Impedance Value: 560 Ohm
Lead Channel Pacing Threshold Amplitude: 0.75 V
Lead Channel Pacing Threshold Amplitude: 1 V
Lead Channel Pacing Threshold Pulse Width: 0.4 ms
Lead Channel Pacing Threshold Pulse Width: 0.4 ms
Lead Channel Sensing Intrinsic Amplitude: 0.8 mV
Lead Channel Sensing Intrinsic Amplitude: 6.5 mV
Lead Channel Setting Pacing Amplitude: 2 V
Lead Channel Setting Pacing Amplitude: 2.5 V
Lead Channel Setting Pacing Pulse Width: 0.4 ms
Lead Channel Setting Sensing Sensitivity: 2 mV
Pulse Gen Model: 2272
Pulse Gen Serial Number: 9149180

## 2020-09-23 ENCOUNTER — Ambulatory Visit
Admission: RE | Admit: 2020-09-23 | Discharge: 2020-09-23 | Disposition: A | Discharge status: Home / Self Care | Payer: Medicare Other | Source: Ambulatory Visit | Attending: Nephrology | Admitting: Nephrology

## 2020-09-23 DIAGNOSIS — N189 Chronic kidney disease, unspecified: Secondary | ICD-10-CM | POA: Diagnosis not present

## 2020-09-23 DIAGNOSIS — N281 Cyst of kidney, acquired: Secondary | ICD-10-CM | POA: Diagnosis not present

## 2020-09-23 DIAGNOSIS — N1831 Chronic kidney disease, stage 3a: Secondary | ICD-10-CM

## 2020-09-23 DIAGNOSIS — R161 Splenomegaly, not elsewhere classified: Secondary | ICD-10-CM | POA: Diagnosis not present

## 2020-09-23 NOTE — Progress Notes (Signed)
Remote pacemaker transmission.   

## 2020-09-28 ENCOUNTER — Ambulatory Visit (INDEPENDENT_AMBULATORY_CARE_PROVIDER_SITE_OTHER): Payer: Medicare Other | Admitting: Podiatry

## 2020-09-28 ENCOUNTER — Other Ambulatory Visit: Payer: Self-pay

## 2020-09-28 DIAGNOSIS — Z89421 Acquired absence of other right toe(s): Secondary | ICD-10-CM

## 2020-09-29 NOTE — Progress Notes (Signed)
  Subjective:  Patient ID: [name]., male    DOB: Dec 17, 1941,  MRN: [id number]  Chief Complaint  Patient presents with  . Routine Post Op    Postop visit, right fourth toe amputation 08/17/20    DOS: 08/17/2020 Procedure: Right fourth toe amputation MTPJ  79 y.o. male returns for post-op check.  Doing well.  He is here with his wife today.  They have been bathing and applying a dressing daily.  Review of Systems: Negative except as noted in the HPI. Denies N/V/F/Ch.   Objective:  There were no vitals filed for this visit. There is no height or weight on file to calculate BMI. Constitutional Well developed. Well nourished.  Vascular Foot warm and well perfused. Capillary refill normal to all digits.   Neurologic Normal speech. Oriented to person, place, and time.  Dermatologic  amputation site well-healed with small scab.  Orthopedic:  No tenderness to palpation noted about the surgical site.    Assessment:  No diagnosis found. Plan:  Patient was evaluated and treated and all questions answered.  S/p foot surgery right -Progressing as expected post-operatively. -WBAT in surgical shoe -Sutures: Removed previously. -Discussed with them they can begin leaving open to air when not in shoes or out of the house.  Return in about 2 weeks (around 10/12/2020) for with Dr Jacqualyn Posey.

## 2020-10-15 ENCOUNTER — Other Ambulatory Visit: Payer: Self-pay

## 2020-10-15 ENCOUNTER — Ambulatory Visit (INDEPENDENT_AMBULATORY_CARE_PROVIDER_SITE_OTHER): Payer: Medicare Other | Admitting: Podiatry

## 2020-10-15 VITALS — Temp 97.2°F

## 2020-10-15 DIAGNOSIS — Z89421 Acquired absence of other right toe(s): Secondary | ICD-10-CM | POA: Diagnosis not present

## 2020-10-17 ENCOUNTER — Encounter: Payer: Self-pay | Admitting: Podiatry

## 2020-10-17 NOTE — Progress Notes (Signed)
  Subjective:  Patient ID: [name]., male    DOB: [birth year],  MRN: [id number]  Chief Complaint  Patient presents with  . Routine Post Op    Post-amputation wound check. Pt states healing well without any concerns. Denies fever/nausea/vomiting/chills.    DOS: 08/17/2020 Procedure: Right fourth toe amputation MTPJ  79 y.o. male returns for post-op check.  Doing well.  Has no pain, he has been bathing regularly.  Review of Systems: Negative except as noted in the HPI. Denies N/V/F/Ch.   Objective:   Vitals:   10/15/20 1524  Temp: (!) 97.2 F (36.2 C)   There is no height or weight on file to calculate BMI. Constitutional Well developed. Well nourished.  Vascular Foot warm and well perfused. Capillary refill normal to all digits.   Neurologic Normal speech. Oriented to person, place, and time.  Dermatologic  amputation site well-healed with small scab.  Orthopedic:  No tenderness to palpation noted about the surgical site.    Assessment:   1. Status post amputation of lesser toe of right foot ([place])    Plan:  Patient was evaluated and treated and all questions answered.  S/p foot surgery right -Progressing as expected post-operatively. -WBAT in diabetic shoes -Sutures: Removed previously. -Return to full activity, he should follow-up with Dr. Jacqualyn Posey at his next visit  Return in about 6 weeks (around 11/26/2020).

## 2020-10-18 DIAGNOSIS — H35372 Puckering of macula, left eye: Secondary | ICD-10-CM | POA: Diagnosis not present

## 2020-10-18 DIAGNOSIS — H01021 Squamous blepharitis right upper eyelid: Secondary | ICD-10-CM | POA: Diagnosis not present

## 2020-10-18 DIAGNOSIS — H01024 Squamous blepharitis left upper eyelid: Secondary | ICD-10-CM | POA: Diagnosis not present

## 2020-10-18 DIAGNOSIS — H353131 Nonexudative age-related macular degeneration, bilateral, early dry stage: Secondary | ICD-10-CM | POA: Diagnosis not present

## 2020-10-25 ENCOUNTER — Telehealth: Payer: Self-pay | Admitting: Student

## 2020-10-25 NOTE — Telephone Encounter (Signed)
Alert received for HVR, consistent with SVT he has had in the past. On 11/6 presenting showed that he was in SVT at the time. Send on 11/7 showed he was back in NSR.  Pt denies symptoms over the weekend. He is in his USOH.   Pt knows to call if he has any symptoms of tachy-palpitations, dizziness, lightheadedness, SOB, chest pain, syncope, or near syncope.   No change at this time.  Legrand Como [address] [place], PA-C  10/25/2020 9:28 AM

## 2020-10-27 ENCOUNTER — Telehealth: Payer: Self-pay | Admitting: Adult Health

## 2020-10-27 NOTE — Telephone Encounter (Signed)
Called to verify which medication pt needs refills on. Spoke to pt's wife, she will call pcp for refill.

## 2020-10-27 NOTE — Telephone Encounter (Signed)
Pt is requesting a refill for mirabegron ER (MYRBETRIQ) 50 MG TB24 tablet  Pharmacy: [id number]

## 2020-11-01 ENCOUNTER — Other Ambulatory Visit: Payer: Self-pay

## 2020-11-01 ENCOUNTER — Inpatient Hospital Stay: Payer: Medicare Other | Attending: Hematology and Oncology

## 2020-11-01 DIAGNOSIS — D696 Thrombocytopenia, unspecified: Secondary | ICD-10-CM

## 2020-11-01 DIAGNOSIS — Z79899 Other long term (current) drug therapy: Secondary | ICD-10-CM | POA: Insufficient documentation

## 2020-11-01 DIAGNOSIS — Z923 Personal history of irradiation: Secondary | ICD-10-CM | POA: Diagnosis not present

## 2020-11-01 DIAGNOSIS — D509 Iron deficiency anemia, unspecified: Secondary | ICD-10-CM | POA: Diagnosis not present

## 2020-11-01 DIAGNOSIS — D472 Monoclonal gammopathy: Secondary | ICD-10-CM

## 2020-11-01 DIAGNOSIS — K760 Fatty (change of) liver, not elsewhere classified: Secondary | ICD-10-CM | POA: Insufficient documentation

## 2020-11-01 DIAGNOSIS — R162 Hepatomegaly with splenomegaly, not elsewhere classified: Secondary | ICD-10-CM | POA: Insufficient documentation

## 2020-11-01 DIAGNOSIS — D638 Anemia in other chronic diseases classified elsewhere: Secondary | ICD-10-CM

## 2020-11-01 DIAGNOSIS — Z8547 Personal history of malignant neoplasm of testis: Secondary | ICD-10-CM | POA: Insufficient documentation

## 2020-11-01 LAB — IRON AND TIBC
Iron: 50 ug/dL (ref 42–163)
Saturation Ratios: 16 % — ABNORMAL LOW (ref 20–55)
TIBC: 311 ug/dL (ref 202–409)
UIBC: 261 ug/dL (ref 117–376)

## 2020-11-01 LAB — CBC WITH DIFFERENTIAL/PLATELET
Abs Immature Granulocytes: 0.01 10*3/uL (ref 0.00–0.07)
Basophils Absolute: 0 10*3/uL (ref 0.0–0.1)
Basophils Relative: 1 %
Eosinophils Absolute: 0.1 10*3/uL (ref 0.0–0.5)
Eosinophils Relative: 2 %
HCT: 30.9 % — ABNORMAL LOW (ref 39.0–52.0)
Hemoglobin: 10.3 g/dL — ABNORMAL LOW (ref 13.0–17.0)
Immature Granulocytes: 0 %
Lymphocytes Relative: 19 %
Lymphs Abs: 0.8 10*3/uL (ref 0.7–4.0)
MCH: 30.3 pg (ref 26.0–34.0)
MCHC: 33.3 g/dL (ref 30.0–36.0)
MCV: 90.9 fL (ref 80.0–100.0)
Monocytes Absolute: 0.2 10*3/uL (ref 0.1–1.0)
Monocytes Relative: 4 %
Neutro Abs: 2.9 10*3/uL (ref 1.7–7.7)
Neutrophils Relative %: 74 %
Platelets: 88 10*3/uL — ABNORMAL LOW (ref 150–400)
RBC: 3.4 MIL/uL — ABNORMAL LOW (ref 4.22–5.81)
RDW: 16.4 % — ABNORMAL HIGH (ref 11.5–15.5)
WBC: 4 10*3/uL (ref 4.0–10.5)
nRBC: 0 % (ref 0.0–0.2)

## 2020-11-01 LAB — COMPREHENSIVE METABOLIC PANEL
ALT: 26 U/L (ref 0–44)
AST: 34 U/L (ref 15–41)
Albumin: 3.8 g/dL (ref 3.5–5.0)
Alkaline Phosphatase: 96 U/L (ref 38–126)
Anion gap: 8 (ref 5–15)
BUN: 22 mg/dL (ref 8–23)
CO2: 29 mmol/L (ref 22–32)
Calcium: 9.1 mg/dL (ref 8.9–10.3)
Chloride: 107 mmol/L (ref 98–111)
Creatinine, Ser: 1.41 mg/dL — ABNORMAL HIGH (ref 0.61–1.24)
GFR, Estimated: 51 mL/min — ABNORMAL LOW (ref >60)
Glucose, Bld: 96 mg/dL (ref 70–99)
Potassium: 3.7 mmol/L (ref 3.5–5.1)
Sodium: 144 mmol/L (ref 135–145)
Total Bilirubin: 0.6 mg/dL (ref 0.3–1.2)
Total Protein: 7.6 g/dL (ref 6.5–8.1)

## 2020-11-01 LAB — FERRITIN: Ferritin: 112 ng/mL (ref 24–336)

## 2020-11-02 LAB — MULTIPLE MYELOMA PANEL, SERUM
Albumin SerPl Elph-Mcnc: 3.7 g/dL (ref 2.9–4.4)
Albumin/Glob SerPl: 1.1 (ref 0.7–1.7)
Alpha 1: 0.2 g/dL (ref 0.0–0.4)
Alpha2 Glob SerPl Elph-Mcnc: 0.7 g/dL (ref 0.4–1.0)
B-Globulin SerPl Elph-Mcnc: 1.3 g/dL (ref 0.7–1.3)
Gamma Glob SerPl Elph-Mcnc: 1.5 g/dL (ref 0.4–1.8)
Globulin, Total: 3.7 g/dL (ref 2.2–3.9)
IgA: 643 mg/dL — ABNORMAL HIGH (ref 61–437)
IgG (Immunoglobin G), Serum: 1369 mg/dL (ref 603–1613)
IgM (Immunoglobulin M), Srm: 97 mg/dL (ref 15–143)
M Protein SerPl Elph-Mcnc: 0.3 g/dL — ABNORMAL HIGH
Total Protein ELP: 7.4 g/dL (ref 6.0–8.5)

## 2020-11-02 LAB — KAPPA/LAMBDA LIGHT CHAINS
Kappa free light chain: 87.5 mg/L — ABNORMAL HIGH (ref 3.3–19.4)
Kappa, lambda light chain ratio: 2.87 — ABNORMAL HIGH (ref 0.26–1.65)
Lambda free light chains: 30.5 mg/L — ABNORMAL HIGH (ref 5.7–26.3)

## 2020-11-08 ENCOUNTER — Inpatient Hospital Stay (HOSPITAL_BASED_OUTPATIENT_CLINIC_OR_DEPARTMENT_OTHER): Payer: Medicare Other | Admitting: Hematology and Oncology

## 2020-11-08 ENCOUNTER — Encounter: Payer: Self-pay | Admitting: Hematology and Oncology

## 2020-11-08 ENCOUNTER — Telehealth: Payer: Self-pay | Admitting: Hematology and Oncology

## 2020-11-08 ENCOUNTER — Other Ambulatory Visit: Payer: Self-pay

## 2020-11-08 DIAGNOSIS — I2510 Atherosclerotic heart disease of native coronary artery without angina pectoris: Secondary | ICD-10-CM | POA: Diagnosis not present

## 2020-11-08 DIAGNOSIS — D696 Thrombocytopenia, unspecified: Secondary | ICD-10-CM

## 2020-11-08 DIAGNOSIS — D509 Iron deficiency anemia, unspecified: Secondary | ICD-10-CM

## 2020-11-08 DIAGNOSIS — T8789 Other complications of amputation stump: Secondary | ICD-10-CM | POA: Insufficient documentation

## 2020-11-08 DIAGNOSIS — Z8547 Personal history of malignant neoplasm of testis: Secondary | ICD-10-CM | POA: Diagnosis not present

## 2020-11-08 DIAGNOSIS — K760 Fatty (change of) liver, not elsewhere classified: Secondary | ICD-10-CM | POA: Diagnosis not present

## 2020-11-08 DIAGNOSIS — R162 Hepatomegaly with splenomegaly, not elsewhere classified: Secondary | ICD-10-CM | POA: Diagnosis not present

## 2020-11-08 DIAGNOSIS — D472 Monoclonal gammopathy: Secondary | ICD-10-CM | POA: Diagnosis not present

## 2020-11-08 NOTE — Assessment & Plan Note (Signed)
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 Even though the CT imaging report from September 2017 did not disclose this, I reviewed the imaging study with the patient extensively which clearly showed mild liver enlargement and splenomegaly. Rarely, autoimmune disorder that could cause pulmonary fibrosis can also cause mild thrombocytopenia. He is not symptomatic.  Previous autoimmune screen, hepatitis C screening and serum vitamin B12 were within normal limits Observe only for now

## 2020-11-08 NOTE — Telephone Encounter (Signed)
Scheduled appts per 11/22 sch msg. Gave pt a print out of AVS.

## 2020-11-08 NOTE — Assessment & Plan Note (Signed)
He has multifactorial anemia, a component of slight iron deficiency but most consistent with anemia of chronic illness I suspect the most likely cause is related to borderline abnormal renal function in chronic nonhealing wound For now, I do not plan to prescribe any form of iron replacement therapy

## 2020-11-08 NOTE — Progress Notes (Signed)
[place] OFFICE PROGRESS NOTE  Patient Care Team: [name], [name], [name], [name], [name], DO as Referring Physician (Family Medicine)  ASSESSMENT & PLAN:  [name] (monoclonal gammopathy of unknown significance) His last myeloma panel was just borderline abnormal Overall, his studies are stable He will continue [name] blood work in his next visit  Iron deficiency anemia He has multifactorial anemia, a component of slight iron deficiency but most consistent with anemia of chronic illness I suspect the most likely cause is related to borderline abnormal renal function in chronic nonhealing wound For now, I do not plan to prescribe any form of iron replacement therapy  Thrombocytopenia ([place]) 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 Even though the CT imaging report from September 2017 did not disclose this, I reviewed the imaging study with the patient extensively which clearly showed mild liver enlargement and splenomegaly. Rarely, autoimmune disorder that could cause pulmonary fibrosis can also cause mild thrombocytopenia. He is not symptomatic.  Previous autoimmune screen, hepatitis C screening and serum vitamin B12 were within normal limits Observe only for now  Non-healing amputation site [facility]) According to his family member, the amputation site is not completely healed He has been receiving wound care for some time I would defer to them for further management but I suspect this is the most likely cause of his persistent chronic anemia   Orders Placed This Encounter  Procedures  . CBC with Differential/Platelet    Standing Status:   Standing    Number of Occurrences:   22    Standing Expiration Date:   11/08/2021  . Comprehensive metabolic panel    Standing Status:    Standing    Number of Occurrences:   33    Standing Expiration Date:   11/08/2021  . Kappa/lambda light chains    Standing Status:   Standing    Number of Occurrences:   22    Standing Expiration Date:   11/08/2021  . Multiple Myeloma Panel (SPEP&IFE w/QIG)    Standing Status:   Standing    Number of Occurrences:   22    Standing Expiration Date:   11/08/2021    All questions were answered. The patient knows to call the clinic with any problems, questions or concerns. The total time spent in the appointment was 20 minutes encounter with patients including review of chart and various tests results, discussions about plan of care and coordination of care plan   [name], MD 11/08/2020 3:31 PM  INTERVAL HISTORY: Please see below for problem oriented charting. He returns for further follow-up Since his last visit, he had toe amputation The stump of his surgical site is not healed He continues to do with chronic nonhealing wound He has gained a lot of weight since last time I saw him The patient denies any recent signs or symptoms of bleeding such as spontaneous epistaxis, hematuria or hematochezia.  SUMMARY OF ONCOLOGIC HISTORY:  Suzanne G Bailon Brooke Bonito. is here because of thrombocytopenia. 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  He was found to have abnormal CBC from abnormal blood work from routine blood work monitoring. I have reviewed outside records from his primary care doctor.  He is noted to have mild pancytopenia since  11/17/2015. On 11/17/2015, white blood cell count 5.9, hemoglobin 12.7 and platelet count 106 On 03/13/2016, white blood cell count 6.6, hemoglobin 12.6 and platelet count 103 On 09/06/2015, white blood cell count 6.6, hemoglobin 11.9 and platelet count 102 On 10/25/2016, white count 5.2, hemoglobin 12.7 and platelet count 86 On 11/08/16, white blood cell  count 5.3, hemoglobin 12.2 implant, 107. On 09/05/2016, serum ferritin was high at 220, iron study showed serum iron low at 24 and 9% iron saturation.  Some older scanned records dated back to 12/13/2011 showed low platelet count of 101 On 12/21/2011, he had normal platelet count of 210 On 07/30/2012, his platelet count was borderline low at 143  On 09/08/2016, [name], such as spontaneous epistaxis, hematuria, 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  He is being observed Repeat CT imaging of the abdomen and pelvis in August 2019 confirmed liver cirrhosis and splenomegaly He was found to have iron deficiency anemia and was placed on oral iron supplement The patient also have [name] and is being observed In October 2020, he received intravenous iron infusion  REVIEW OF SYSTEMS:   Constitutional: Denies fevers, chills or abnormal weight loss Eyes: Denies blurriness of vision Ears, nose, mouth, throat, and face: Denies mucositis or sore throat Respiratory: Denies cough, dyspnea or wheezes Cardiovascular: Denies palpitation, chest discomfort or lower extremity swelling Gastrointestinal:  Denies nausea, heartburn or change in bowel habits Skin: Denies abnormal skin rashes Lymphatics: Denies new lymphadenopathy or easy  bruising Neurological:Denies numbness, tingling or new weaknesses Behavioral/Psych: Mood is stable, no new changes  All other systems were reviewed with the patient and are negative.  I have reviewed the past medical history, past surgical history, social history and family history with the patient and they are unchanged from previous note.  ALLERGIES:  is allergic to aricept [donepezil], lipitor [atorvastatin], nsaids, warfarin and related, and enbrel [etanercept].  MEDICATIONS:  Current Outpatient Medications  Medication Sig Dispense Refill  . acetaminophen (TYLENOL) 325 MG tablet Take 1-2 tablets (325-650 mg total) by mouth every 4 (four) hours as needed for mild pain.    [name] allopurinol (ZYLOPRIM) 300 MG tablet Take 1 tablet (300 mg total) by mouth daily. 30 tablet 0  . ferrous sulfate 325 (65 FE) MG tablet Take 1 tablet (325 mg total) by mouth daily with breakfast. 30 tablet 3  . memantine (NAMENDA) 10 MG tablet Take 10 mg by mouth 2 (two) times daily.    . metoprolol succinate (TOPROL-XL) 25 MG 24 hr tablet Take 0.5 tablets (12.5 mg total) by mouth daily. 30 tablet 0  . mirabegron ER (MYRBETRIQ) 50 MG TB24 tablet Take 1 tablet (50 mg total) by mouth daily. 30 tablet 3  . modafinil (PROVIGIL) 100 MG tablet Take 0.5 tablets (50 mg total) by mouth daily. 30 tablet 1  . Multiple Vitamin (MULTIVITAMIN WITH MINERALS) TABS tablet Take 1 tablet by  mouth daily.    . pantoprazole (PROTONIX) 40 MG tablet Take 1 tablet (40 mg total) by mouth daily. 30 tablet 0  . rosuvastatin (CRESTOR) 40 MG tablet Take 1 tablet (40 mg total) by mouth daily. 90 tablet 2  . sertraline (ZOLOFT) 100 MG tablet Take 2 tablets (200 mg total) by mouth daily. 30 tablet 0  . tamsulosin (FLOMAX) 0.4 MG CAPS capsule Take 1 capsule (0.4 mg total) by mouth at bedtime. 30 capsule 1  . torsemide (DEMADEX) 20 MG tablet TAKE 2 TABLETS IN THE MORNING AND 1 TABLET IN THE AFTERNOON 270 tablet 3   No current facility-administered  medications for this visit.    PHYSICAL EXAMINATION: ECOG PERFORMANCE STATUS: 1 - Symptomatic but completely ambulatory  Vitals:   11/08/20 0919  BP: (!) 113/93  Pulse: 100  Resp: 18  SpO2: 100%   Filed Weights   11/08/20 0919  Weight: 195 lb (88.5 kg)    GENERAL:alert, no distress and comfortable NEURO: alert & oriented x 3 with fluent speech, no focal motor/sensory deficits  LABORATORY DATA:  I have reviewed the data as listed    Component Value Date/Time   NA 144 11/01/2020 0944   NA 141 04/21/2020 1628   NA 141 02/23/2017 1237   K 3.7 11/01/2020 0944   K 3.8 02/23/2017 1237   CL 107 11/01/2020 0944   CO2 29 11/01/2020 0944   CO2 27 02/23/2017 1237   GLUCOSE 96 11/01/2020 0944   GLUCOSE 113 02/23/2017 1237   BUN 22 11/01/2020 0944   BUN 25 04/21/2020 1628   BUN 18.3 02/23/2017 1237   CREATININE 1.41 (H) 11/01/2020 0944   CREATININE 1.2 02/23/2017 1237   CALCIUM 9.1 11/01/2020 0944   CALCIUM 9.6 02/23/2017 1237   PROT 7.6 11/01/2020 0944   PROT 7.3 06/18/2018 1205   PROT 8.0 02/23/2017 1237   ALBUMIN 3.8 11/01/2020 0944   ALBUMIN 4.1 09/24/2017 0824   ALBUMIN 4.1 02/23/2017 1237   AST 34 11/01/2020 0944   AST 24 02/23/2017 1237   ALT 26 11/01/2020 0944   ALT 15 02/23/2017 1237   ALKPHOS 96 11/01/2020 0944   ALKPHOS 105 02/23/2017 1237   BILITOT 0.6 11/01/2020 0944   BILITOT 0.6 09/24/2017 0824   BILITOT 0.98 02/23/2017 1237   GFRNONAA 51 (L) 11/01/2020 0944   GFRAA >60 08/20/2020 0757    No results found for: SPEP, UPEP  Lab Results  Component Value Date   WBC 4.0 11/01/2020   NEUTROABS 2.9 11/01/2020   HGB 10.3 (L) 11/01/2020   HCT 30.9 (L) 11/01/2020   MCV 90.9 11/01/2020   PLT 88 (L) 11/01/2020      Chemistry      Component Value Date/Time   NA 144 11/01/2020 0944   NA 141 04/21/2020 1628   NA 141 02/23/2017 1237   K 3.7 11/01/2020 0944   K 3.8 02/23/2017 1237   CL 107 11/01/2020 0944   CO2 29 11/01/2020 0944   CO2 27 02/23/2017  1237   BUN 22 11/01/2020 0944   BUN 25 04/21/2020 1628   BUN 18.3 02/23/2017 1237   CREATININE 1.41 (H) 11/01/2020 0944   CREATININE 1.2 02/23/2017 1237      Component Value Date/Time   CALCIUM 9.1 11/01/2020 0944   CALCIUM 9.6 02/23/2017 1237   ALKPHOS 96 11/01/2020 0944   ALKPHOS 105 02/23/2017 1237   AST 34 11/01/2020 0944   AST 24 02/23/2017 1237   ALT 26 11/01/2020 0944  ALT 15 02/23/2017 1237   BILITOT 0.6 11/01/2020 0944   BILITOT 0.6 09/24/2017 0824   BILITOT 0.98 02/23/2017 1237

## 2020-11-08 NOTE — Assessment & Plan Note (Signed)
His last myeloma panel was just borderline abnormal Overall, his studies are stable He will continue [name] blood work in his next visit

## 2020-11-08 NOTE — Assessment & Plan Note (Signed)
According to his family member, the amputation site is not completely healed He has been receiving wound care for some time I would defer to them for further management but I suspect this is the most likely cause of his persistent chronic anemia

## 2020-11-18 DIAGNOSIS — Z03818 Encounter for observation for suspected exposure to other biological agents ruled out: Secondary | ICD-10-CM | POA: Diagnosis not present

## 2020-11-18 DIAGNOSIS — Z20822 Contact with and (suspected) exposure to covid-19: Secondary | ICD-10-CM | POA: Diagnosis not present

## 2020-11-18 DIAGNOSIS — U071 COVID-19: Secondary | ICD-10-CM | POA: Diagnosis not present

## 2020-11-19 ENCOUNTER — Other Ambulatory Visit (HOSPITAL_COMMUNITY): Payer: Self-pay

## 2020-11-20 ENCOUNTER — Telehealth (HOSPITAL_COMMUNITY): Payer: Self-pay | Admitting: Nurse Practitioner

## 2020-11-20 ENCOUNTER — Encounter (HOSPITAL_BASED_OUTPATIENT_CLINIC_OR_DEPARTMENT_OTHER): Payer: Self-pay | Admitting: Emergency Medicine

## 2020-11-20 ENCOUNTER — Other Ambulatory Visit: Payer: Self-pay

## 2020-11-20 ENCOUNTER — Emergency Department (HOSPITAL_BASED_OUTPATIENT_CLINIC_OR_DEPARTMENT_OTHER)
Admission: EM | Admit: 2020-11-20 | Discharge: 2020-11-20 | Disposition: A | Discharge status: Home / Self Care | Payer: Medicare Other | Attending: Emergency Medicine | Admitting: Emergency Medicine

## 2020-11-20 ENCOUNTER — Emergency Department (HOSPITAL_BASED_OUTPATIENT_CLINIC_OR_DEPARTMENT_OTHER): Payer: Medicare Other

## 2020-11-20 ENCOUNTER — Telehealth: Payer: Self-pay | Admitting: Infectious Diseases

## 2020-11-20 DIAGNOSIS — I4891 Unspecified atrial fibrillation: Secondary | ICD-10-CM | POA: Insufficient documentation

## 2020-11-20 DIAGNOSIS — U071 COVID-19: Secondary | ICD-10-CM

## 2020-11-20 DIAGNOSIS — Z79899 Other long term (current) drug therapy: Secondary | ICD-10-CM | POA: Diagnosis not present

## 2020-11-20 DIAGNOSIS — Z7901 Long term (current) use of anticoagulants: Secondary | ICD-10-CM | POA: Diagnosis not present

## 2020-11-20 DIAGNOSIS — Z87891 Personal history of nicotine dependence: Secondary | ICD-10-CM | POA: Insufficient documentation

## 2020-11-20 DIAGNOSIS — Z8546 Personal history of malignant neoplasm of prostate: Secondary | ICD-10-CM | POA: Diagnosis not present

## 2020-11-20 DIAGNOSIS — I5032 Chronic diastolic (congestive) heart failure: Secondary | ICD-10-CM | POA: Insufficient documentation

## 2020-11-20 DIAGNOSIS — G3183 Dementia with Lewy bodies: Secondary | ICD-10-CM | POA: Insufficient documentation

## 2020-11-20 DIAGNOSIS — Z950 Presence of cardiac pacemaker: Secondary | ICD-10-CM | POA: Insufficient documentation

## 2020-11-20 DIAGNOSIS — E119 Type 2 diabetes mellitus without complications: Secondary | ICD-10-CM | POA: Insufficient documentation

## 2020-11-20 DIAGNOSIS — I2510 Atherosclerotic heart disease of native coronary artery without angina pectoris: Secondary | ICD-10-CM | POA: Diagnosis not present

## 2020-11-20 DIAGNOSIS — I110 Hypertensive heart disease with heart failure: Secondary | ICD-10-CM | POA: Insufficient documentation

## 2020-11-20 DIAGNOSIS — Z951 Presence of aortocoronary bypass graft: Secondary | ICD-10-CM | POA: Diagnosis not present

## 2020-11-20 DIAGNOSIS — J1282 Pneumonia due to coronavirus disease 2019: Secondary | ICD-10-CM | POA: Insufficient documentation

## 2020-11-20 DIAGNOSIS — R0602 Shortness of breath: Secondary | ICD-10-CM | POA: Diagnosis present

## 2020-11-20 DIAGNOSIS — J189 Pneumonia, unspecified organism: Secondary | ICD-10-CM | POA: Diagnosis not present

## 2020-11-20 MED ORDER — DIPHENHYDRAMINE HCL 50 MG/ML IJ SOLN: 50.0000 mg | Freq: Once | INTRAMUSCULAR | Status: DC | PRN

## 2020-11-20 MED ORDER — ALBUTEROL SULFATE HFA 108 (90 BASE) MCG/ACT IN AERS: 2.0000 | INHALATION_SPRAY | Freq: Once | RESPIRATORY_TRACT | Status: DC | PRN

## 2020-11-20 MED ORDER — FAMOTIDINE IN NACL 20-0.9 MG/50ML-% IV SOLN: 20.0000 mg | Freq: Once | INTRAVENOUS | Status: DC | PRN

## 2020-11-20 MED ORDER — SODIUM CHLORIDE 0.9 % IV SOLN: INTRAVENOUS | Status: DC | PRN

## 2020-11-20 MED ORDER — SODIUM CHLORIDE 0.9 % IV SOLN
INTRAVENOUS | Status: DC | PRN
  Administered 2020-11-20: 500 mL via INTRAVENOUS

## 2020-11-20 MED ORDER — SODIUM CHLORIDE 0.9 % IV SOLN
1200.0000 mg | Freq: Once | INTRAVENOUS | Status: AC
  Administered 2020-11-20: 1200 mg via INTRAVENOUS
  Filled 2020-11-20: qty 10

## 2020-11-20 MED ORDER — EPINEPHRINE 0.3 MG/0.3ML IJ SOAJ: 0.3000 mg | Freq: Once | INTRAMUSCULAR | Status: DC | PRN

## 2020-11-20 MED ORDER — METHYLPREDNISOLONE SODIUM SUCC 125 MG IJ SOLR: 125.0000 mg | Freq: Once | INTRAMUSCULAR | Status: DC | PRN

## 2020-11-20 MED ORDER — DOXYCYCLINE HYCLATE 100 MG PO TABS
100.0000 mg | ORAL_TABLET | Freq: Once | ORAL | Status: AC
  Administered 2020-11-20: 100 mg via ORAL
  Filled 2020-11-20: qty 1

## 2020-11-20 MED ORDER — DOXYCYCLINE HYCLATE 100 MG PO CAPS: 100.0000 mg | ORAL_CAPSULE | Freq: Two times a day (BID) | ORAL | 0 refills | Status: DC

## 2020-11-20 NOTE — Telephone Encounter (Signed)
Called to discuss with Nakul G Pennie Rushing. about Covid symptoms and the use of a monoclonal antibody infusion for those with mild to moderate Covid symptoms and at a high risk of hospitalization.     Pt is qualified for this infusion at the [facility] infusion center due to co-morbid conditions and/or a member of an at-risk group. He is currently checked in at the [facility]. Unclear if this location has mAB available. If so, I would recommend administration in the ER as patient is high risk of complications and there is a significant mAB shortage available for outpatient administration at this time and delay in care for this patient may be life threatening.  Discussed my recommendation with patient's daughter who consented for treatment. Daughter also disclosed that patient's wife is infected with covid, similar risk, and has worsening symptoms. Advised her to seek evaluation at [facility] or ED.     Patient Active Problem List   Diagnosis Date Noted  . 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 Severe sepsis ([place]) 08/14/2020  . Cellulitis of right lower leg 08/13/2020  . Pulmonary fibrosis ([place]) 08/05/2020  . OSA on CPAP 02/11/2020  . Persistent hypersomnia 02/11/2020  . Cognitive decline 02/11/2020  . Hip fracture ([place]) 02/09/2020  . Osteomyelitis of second toe of right foot ([place])   . Closed right hip fracture, initial encounter ([place]) 02/04/2020  . Closed intertrochanteric fracture of right hip, initial encounter ([place]) 02/04/2020  . Closed nondisplaced intertrochanteric fracture of right femur ([place])   . Symptomatic bradycardia 09/24/2019  . Chronic diastolic heart failure ([place]) 06/03/2019  . Coronary artery disease involving native heart without angina pectoris 06/03/2019  . Debility 05/26/2019  . 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 Toe infection 05/17/2019  . DOE (dyspnea on exertion) 05/15/2019  . Severe tricuspid regurgitation 05/15/2019  . Chronic  right-sided CHF (congestive heart failure) ([place]) 05/15/2019  . Cellulitis of fourth toe of left foot   . Excessive daytime sleepiness 02/11/2019  . 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[place]) 02/11/2019  . Iron deficiency anemia 02/04/2019  . Poor memory 12/16/2018  . Deficiency anemia 11/19/2018  . Other fatigue 11/19/2018  . History of elevated PSA 11/19/2018  . Prostate cancer screening 11/19/2018  . Prostate CA ([place]) 11/19/2018  . Pancytopenia, acquired ([place]) 08/13/2018  . Recurrent falls 08/13/2018  . 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 Peripheral neuropathy 07/31/2018  . 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 Anemia, chronic disease 01/28/2018  . History of skin cancer 07/27/2017  . Fatty liver disease, nonalcoholic 26/71/2458  . Thrombocytopenia ([place]) 11/27/2016  . Lung nodule 04/23/2014  . Postinflammatory pulmonary fibrosis ([place]) 06/13/2013  . Long term (current) use of anticoagulants 11/22/2011  . Abdominal bruit 03/07/2011  . Pacemaker-St.Jude   . Hx of CABG   . Hyperlipidemia type II   . OSA (obstructive sleep apnea)   . PACEMAKER, PERMANENT 08/17/2010  . HYPERLIPIDEMIA 08/16/2010  . Obesity 08/16/2010  . Essential hypertension 08/16/2010  . Atrial fibrillation ([place]) 08/16/2010  . IRREGULAR HEART RATE 08/16/2010  . Sleep apnea 08/16/2010  . BENIGN PROSTATIC HYPERTROPHY, HX OF 08/16/2010     Beckey Rutter, NP [phone] [email or web address]

## 2020-11-20 NOTE — ED Provider Notes (Signed)
[place] EMERGENCY DEPARTMENT Provider Note   CSN: [id number] Arrival date & time: 11/20/20  1216     History Chief Complaint  Patient presents with  . Cough    [name]. is a 79 y.o. male.  Pt presents to the ED today with sob and cough.  [place].  He came home on Saturday, 11/27.  His wife started getting sick on the 29th and he started getting sick on 12/1.  He went to [facility] and had a test on 12/2.  It came back today as positive.  His daughter spoke to the MAB infusion clinic and they told him to get his infusion here.  Pt has been vaccinated.           Past Medical History:  Diagnosis Date  . (HFpEF) heart failure with preserved ejection fraction ([place])    a. 05/2013 Echo: EF 55%, mild LVH, diast dysfxn, Ao sclerosis, mildly dil LA, RV dysfxn (poorly visualized), PASP 43mmHg;  b. 03/2017 Echo: EF 55-60%, no rwma, triv MR, mildly dil RV, mod TR, PASP 75mmHg.  . Atrial fibrillation [facility])    s/p Cox Maze 1/09;  Multaq Rx d/c'd in 2014 due to pulmo fibrosis;  coumadin d/c'd in 2014 due to spontaneous subdural hematoma  . BPH (benign prostatic hyperplasia)   . CAD (coronary artery disease), native coronary artery    a. s/p CABG 12/2007;  b. Myoview 12/2011: EF 66%, no scar or ischemia; normal.  . DM (diabetes mellitus) ([place])   . Hyperlipidemia type II   . Hypertension   . 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 OSA (obstructive sleep apnea)   . Pacemaker    PPM - St. Jude  . Peripheral neuropathy 07/31/2018  . 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 Subdural hematoma ([place]) 07/2012   spontaneous;  coumadin d/c'd => no longer a candidate for anticoagulation    Patient Active Problem List   Diagnosis Date Noted  . 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 Severe sepsis ([place]) 08/14/2020  . Cellulitis of right lower leg 08/13/2020  . Pulmonary fibrosis ([place])  08/05/2020  . OSA on CPAP 02/11/2020  . Persistent hypersomnia 02/11/2020  . Cognitive decline 02/11/2020  . Hip fracture ([place]) 02/09/2020  . Osteomyelitis of second toe of right foot ([place])   . Closed right hip fracture, initial encounter ([place]) 02/04/2020  . Closed intertrochanteric fracture of right hip, initial encounter ([place]) 02/04/2020  . Closed nondisplaced intertrochanteric fracture of right femur ([place])   . Symptomatic bradycardia 09/24/2019  . Chronic diastolic heart failure ([place]) 06/03/2019  . Coronary artery disease involving native heart without angina pectoris 06/03/2019  . Debility 05/26/2019  . 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 Toe infection 05/17/2019  . DOE (dyspnea on exertion) 05/15/2019  . Severe tricuspid regurgitation 05/15/2019  . Chronic right-sided CHF (congestive heart failure) ([place]) 05/15/2019  . Cellulitis of fourth toe of left foot   . Excessive daytime sleepiness 02/11/2019  . 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[place]) 02/11/2019  . Iron deficiency anemia 02/04/2019  . Poor memory 12/16/2018  . Deficiency anemia 11/19/2018  . Other fatigue 11/19/2018  . History of elevated PSA 11/19/2018  . Prostate cancer screening 11/19/2018  . Prostate CA ([place]) 11/19/2018  . Pancytopenia, acquired ([place]) 08/13/2018  . Recurrent falls 08/13/2018  . 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  Peripheral neuropathy 07/31/2018  . 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 Anemia, chronic disease 01/28/2018  . History of skin cancer 07/27/2017  . Fatty liver disease, nonalcoholic 67/67/2094  . Thrombocytopenia ([place]) 11/27/2016  . Lung nodule 04/23/2014  . Postinflammatory pulmonary fibrosis ([place]) 06/13/2013  . Long term (current) use of anticoagulants 11/22/2011  . Abdominal bruit 03/07/2011  . Pacemaker-St.Jude   . Hx of CABG   . Hyperlipidemia type II   . OSA (obstructive sleep apnea)   .  PACEMAKER, PERMANENT 08/17/2010  . HYPERLIPIDEMIA 08/16/2010  . Obesity 08/16/2010  . Essential hypertension 08/16/2010  . Atrial fibrillation ([place]) 08/16/2010  . IRREGULAR HEART RATE 08/16/2010  . Sleep apnea 08/16/2010  . BENIGN PROSTATIC HYPERTROPHY, HX OF 08/16/2010    Past Surgical History:  Procedure Laterality Date  . AMPUTATION Left 05/17/2019   Procedure: AMPUTATION LEFT FOURTH TOE;  Surgeon: Meredith Pel, MD;  Location: [place];  Service: Orthopedics;  Laterality: Left;  . AMPUTATION TOE Right 02/06/2020   Procedure: AMPUTATION TOE;  Surgeon: Newt Minion, MD;  Location: [facility];  Service: Orthopedics;  Laterality: Right;  . AMPUTATION TOE Right 08/17/2020   Procedure: AMPUTATION TOE 4th toe;  Surgeon: Trula Slade, DPM;  Location: [facility];  Service: Podiatry;  Laterality: Right;  . APPENDECTOMY    . CHOLECYSTECTOMY    . CORONARY ARTERY BYPASS GRAFT     x3 (left internal mammary artery to distal left anterior descending coronary artery, saphenous vain graft to second circumflex marginal branch, saphenous vain graft to posterior descending coronary artery, endoscopic saphenous vain harvest from right thigh) and modified Cox - Maze IV procedure.  [name],MD. Electronically signed CHO/MEDQ D: 01/09/2008/ JOB: [id number] cc:  [name]  . [name]  07/30/2012   Procedure: CRANIOTOMY HEMATOMA EVACUATION SUBDURAL;  Surgeon: [name], MD;  Location: [facility];  Service: Neurosurgery;  Laterality: Right;  Right craniotomy for evacuation of subdural hematoma  . FOOT SURGERY    . HERNIA REPAIR    . INTRAMEDULLARY (IM) NAIL INTERTROCHANTERIC Right 02/04/2020   Procedure: INTRAMEDULLARY (IM) NAIL INTERTROCHANTRIC;  Surgeon: Netta Cedars, MD;  Location: [facility];  Service: Orthopedics;  Laterality: Right;  . ORCHIECTOMY     Left  /  testicular cancer  . PACEMAKER PLACEMENT     PPM - St. Jude  . PPM GENERATOR CHANGEOUT N/A 06/25/2019   Procedure: PPM GENERATOR  CHANGEOUT;  Surgeon: Evans Lance, MD;  Location: [place] CV LAB;  Service: Cardiovascular;  Laterality: N/A;       Family History  Problem Relation Age of Onset  . Heart disease Father   . Heart attack Father   . Heart failure Father   . Heart disease Mother   . Alzheimer's disease Mother   . Dementia Mother     Social History   Tobacco Use  . Smoking status: Former Smoker    Packs/day: 2.00    Years: 20.00    Pack years: 40.00    Types: Cigarettes    Quit date: 02/21/1991    Years since quitting: 29.7  . Smokeless tobacco: Never Used  Vaping Use  . Vaping Use: Never used  Substance Use Topics  . Alcohol use: No    Alcohol/week: 0.0 standard drinks  . Drug use: No    Home Medications Prior to Admission medications   Medication Sig Start Date End Date Taking? Authorizing Provider  acetaminophen (TYLENOL) 325 MG tablet Take  1-2 tablets (325-650 mg total) by mouth every 4 (four) hours as needed for mild pain. 02/24/20   Angiulli, Lavon Paganini, PA-C  allopurinol (ZYLOPRIM) 300 MG tablet Take 1 tablet (300 mg total) by mouth daily. 02/24/20   Angiulli, Lavon Paganini, PA-C  doxycycline (VIBRAMYCIN) 100 MG capsule Take 1 capsule (100 mg total) by mouth 2 (two) times daily. 11/20/20   Isla Pence, MD  ferrous sulfate 325 (65 FE) MG tablet Take 1 tablet (325 mg total) by mouth daily with breakfast. 02/24/20   Angiulli, Lavon Paganini, PA-C  memantine (NAMENDA) 10 MG tablet Take 10 mg by mouth 2 (two) times daily. 08/29/20   [provider]  metoprolol succinate (TOPROL-XL) 25 MG 24 hr tablet Take 0.5 tablets (12.5 mg total) by mouth daily. 02/24/20   Angiulli, Lavon Paganini, PA-C  mirabegron ER (MYRBETRIQ) 50 MG TB24 tablet Take 1 tablet (50 mg total) by mouth daily. 02/24/20   Angiulli, Lavon Paganini, PA-C  modafinil (PROVIGIL) 100 MG tablet Take 0.5 tablets (50 mg total) by mouth daily. 09/08/20   Ward Givens, NP  Multiple Vitamin (MULTIVITAMIN WITH MINERALS) TABS tablet Take 1 tablet by mouth  daily. 02/10/20   Guilford Shi, MD  pantoprazole (PROTONIX) 40 MG tablet Take 1 tablet (40 mg total) by mouth daily. 02/24/20   Angiulli, Lavon Paganini, PA-C  rosuvastatin (CRESTOR) 40 MG tablet Take 1 tablet (40 mg total) by mouth daily. 02/24/20   Angiulli, Lavon Paganini, PA-C  sertraline (ZOLOFT) 100 MG tablet Take 2 tablets (200 mg total) by mouth daily. 02/24/20   Angiulli, Lavon Paganini, PA-C  tamsulosin (FLOMAX) 0.4 MG CAPS capsule Take 1 capsule (0.4 mg total) by mouth at bedtime. 02/24/20   Angiulli, Lavon Paganini, PA-C  torsemide (DEMADEX) 20 MG tablet TAKE 2 TABLETS IN THE MORNING AND 1 TABLET IN THE AFTERNOON 09/13/20   [name], MD    Allergies    Aricept [donepezil], Lipitor [atorvastatin], Nsaids, Warfarin and related, and Enbrel [etanercept]  Review of Systems   Review of Systems  Respiratory: Positive for cough and shortness of breath.   All other systems reviewed and are negative.   Physical Exam Updated Vital Signs BP 124/81   Pulse 100   Temp 98.4 F (36.9 C) (Oral)   Resp 18   Ht 5\' 8"  (1.727 m)   Wt 79.8 kg   SpO2 97%   BMI 26.76 kg/m   Physical Exam Vitals and nursing note reviewed.  Constitutional:      Appearance: Normal appearance.  HENT:     Head: Normocephalic and atraumatic.     Right Ear: External ear normal.     Left Ear: External ear normal.     Nose: Nose normal.     Mouth/Throat:     Mouth: Mucous membranes are moist.     Pharynx: Oropharynx is clear.  Eyes:     Extraocular Movements: Extraocular movements intact.     Conjunctiva/sclera: Conjunctivae normal.     Pupils: Pupils are equal, round, and reactive to light.  Cardiovascular:     Rate and Rhythm: Regular rhythm. Tachycardia present.     Pulses: Normal pulses.     Heart sounds: Normal heart sounds.  Pulmonary:     Effort: Pulmonary effort is normal.     Breath sounds: Normal breath sounds.  Abdominal:     General: Abdomen is flat. Bowel sounds are normal.     Palpations: Abdomen is soft.   Musculoskeletal:  General: Normal range of motion.     Cervical back: Normal range of motion and neck supple.  Skin:    General: Skin is warm.     Capillary Refill: Capillary refill takes less than 2 seconds.  Neurological:     General: No focal deficit present.     Mental Status: He is alert and oriented to person, place, and time.  Psychiatric:        Mood and Affect: Mood normal.        Behavior: Behavior normal.     ED Results / Procedures / Treatments   Labs (all labs ordered are listed, but only abnormal results are displayed) Labs Reviewed - No data to display  EKG None  Radiology DG Chest Portable 1 View  Result Date: 11/20/2020 CLINICAL DATA:  COVID-19 positive, history of tobacco abuse EXAM: PORTABLE CHEST 1 VIEW COMPARISON:  08/05/2020 FINDINGS: Single frontal view of the chest demonstrates a stable dual lead pacemaker. Cardiac silhouette is enlarged. Postsurgical changes from CABG. Basilar predominant scarring and fibrosis is unchanged. There is some minimal airspace disease that has developed at the lung bases since prior study, which could reflect superimposed pneumonia. No effusion or pneumothorax. No acute bony abnormalities. IMPRESSION: 1. Minimal basilar ground-glass airspace disease superimposed upon chronic scarring and fibrosis, which could reflect early multifocal COVID-19 pneumonia. Electronically Signed   By: Randa Ngo M.D.   On: 11/20/2020 20:06    Procedures Procedures (including critical care time)  Medications Ordered in ED Medications  0.9 %  sodium chloride infusion (has no administration in time range)  diphenhydrAMINE (BENADRYL) injection 50 mg (has no administration in time range)  famotidine (PEPCID) IVPB 20 mg premix (has no administration in time range)  methylPREDNISolone sodium succinate (SOLU-MEDROL) 125 mg/2 mL injection 125 mg (has no administration in time range)  albuterol (VENTOLIN HFA) 108 (90 Base) MCG/ACT inhaler 2 puff  (has no administration in time range)  EPINEPHrine (EPI-PEN) injection 0.3 mg (has no administration in time range)  0.9 %  sodium chloride infusion (500 mLs Intravenous New Bag/Given 11/20/20 1833)  doxycycline (VIBRA-TABS) tablet 100 mg (has no administration in time range)  casirivimab-imdevimab (REGEN-COV) 1,200 mg in sodium chloride 0.9 % 110 mL IVPB ( Intravenous Stopped 11/20/20 1858)    ED Course  I have reviewed the triage vital signs and the nursing notes.  Pertinent labs & imaging results that were available during my care of the patient were reviewed by me and considered in my medical decision making (see chart for details).    MDM Rules/Calculators/A&P                           Pt does have Covid.  He has been vaccinated, so his symptoms are not severe.  He qualifies for the mab for his age and multiple medical problems.  He is interested in getting the mab today.  Pt received mab and did well.  No adverse events.  CXR shows some early multifocal pneumonia.  He remains with a good O2 sat.  He will be started on doxy.    Pt is to return if worse.  F/u with pcp.  [name]. was evaluated in Emergency Department on 11/20/2020 for the symptoms described in the history of present illness. He was evaluated in the context of the global COVID-19 pandemic, which necessitated consideration that the patient might be at risk for infection with the SARS-CoV-2 virus that causes  COVID-19. Institutional protocols and algorithms that pertain to the evaluation of patients at risk for COVID-19 are in a state of rapid change based on information released by regulatory bodies including the CDC and federal and state organizations. These policies and algorithms were followed during the patient's care in the ED. Final Clinical Impression(s) / ED Diagnoses Final diagnoses:  Pneumonia due to COVID-19 virus    Rx / DC Orders ED Discharge Orders         Ordered    doxycycline (VIBRAMYCIN) 100 MG  capsule  2 times daily        11/20/20 2009           Isla Pence, MD 11/20/20 2026

## 2020-11-20 NOTE — Discharge Instructions (Addendum)
Person Under Monitoring Name: [name].  Location: [address] [place] [facility] [address]   Infection Prevention Recommendations for Individuals Confirmed to have, or Being Evaluated for, 2019 Novel Coronavirus (COVID-19) Infection Who Receive Care at Home  Individuals who are confirmed to have, or are being evaluated for, COVID-19 should follow the prevention steps below until a healthcare provider or local or state health department says they can return to normal activities.  Stay home except to get medical care You should restrict activities outside your home, except for getting medical care. Do not go to work, school, or public areas, and do not use public transportation or taxis.  Call ahead before visiting your doctor Before your medical appointment, call the healthcare provider and tell them that you have, or are being evaluated for, COVID-19 infection. This will help the healthcare provider's office take steps to keep other people from getting infected. Ask your healthcare provider to call the local or state health department.  Monitor your symptoms Seek prompt medical attention if your illness is worsening (e.g., difficulty breathing). Before going to your medical appointment, call the healthcare provider and tell them that you have, or are being evaluated for, COVID-19 infection. Ask your healthcare provider to call the local or state health department.  Wear a facemask You should wear a facemask that covers your nose and mouth when you are in the same room with other people and when you visit a healthcare provider. People who live with or visit you should also wear a facemask while they are in the same room with you.  Separate yourself from other people in your home As much as possible, you should stay in a different room from other people in your home. Also, you should use a separate bathroom, if available.  Avoid sharing household items You should  not share dishes, drinking glasses, cups, eating utensils, towels, bedding, or other items with other people in your home. After using these items, you should wash them thoroughly with soap and water.  Cover your coughs and sneezes Cover your mouth and nose with a tissue when you cough or sneeze, or you can cough or sneeze into your sleeve. Throw used tissues in a lined trash can, and immediately wash your hands with soap and water for at least 20 seconds or use an alcohol-based hand rub.  Wash your [facility] your hands often and thoroughly with soap and water for at least 20 seconds. You can use an alcohol-based hand sanitizer if soap and water are not available and if your hands are not visibly dirty. Avoid touching your eyes, nose, and mouth with unwashed hands.   Prevention Steps for Caregivers and Household Members of Individuals Confirmed to have, or Being Evaluated for, COVID-19 Infection Being Cared for in the Home  If you live with, or provide care at home for, a person confirmed to have, or being evaluated for, COVID-19 infection please follow these guidelines to prevent infection:  Follow healthcare provider's instructions Make sure that you understand and can help the patient follow any healthcare provider instructions for all care.  Provide for the patient's basic needs You should help the patient with basic needs in the home and provide support for getting groceries, prescriptions, and other personal needs.  Monitor the patient's symptoms If they are getting sicker, call his or her medical provider and tell them that the patient has, or is being evaluated for, COVID-19 infection. This will help the healthcare provider's  office take steps to keep other people from getting infected. Ask the healthcare provider to call the local or state health department.  Limit the number of people who have contact with the patient If possible, have only one caregiver for the  patient. Other household members should stay in another home or place of residence. If this is not possible, they should stay in another room, or be separated from the patient as much as possible. Use a separate bathroom, if available. Restrict visitors who do not have an essential need to be in the home.  Keep older adults, very young children, and other sick people away from the patient Keep older adults, very young children, and those who have compromised immune systems or chronic health conditions away from the patient. This includes people with chronic heart, lung, or kidney conditions, diabetes, and cancer.  Ensure good ventilation Make sure that shared spaces in the home have good air flow, such as from an air conditioner or an opened window, weather permitting.  Wash your hands often Wash your hands often and thoroughly with soap and water for at least 20 seconds. You can use an alcohol based hand sanitizer if soap and water are not available and if your hands are not visibly dirty. Avoid touching your eyes, nose, and mouth with unwashed hands. Use disposable paper towels to dry your hands. If not available, use dedicated cloth towels and replace them when they become wet.  Wear a facemask and gloves Wear a disposable facemask at all times in the room and gloves when you touch or have contact with the patient's blood, body fluids, and/or secretions or excretions, such as sweat, saliva, sputum, nasal mucus, vomit, urine, or feces.  Ensure the mask fits over your nose and mouth tightly, and do not touch it during use. Throw out disposable facemasks and gloves after using them. Do not reuse. Wash your hands immediately after removing your facemask and gloves. If your personal clothing becomes contaminated, carefully remove clothing and launder. Wash your hands after handling contaminated clothing. Place all used disposable facemasks, gloves, and other waste in a lined container before  disposing them with other household waste. Remove gloves and wash your hands immediately after handling these items.  Do not share dishes, glasses, or other household items with the patient Avoid sharing household items. You should not share dishes, drinking glasses, cups, eating utensils, towels, bedding, or other items with a patient who is confirmed to have, or being evaluated for, COVID-19 infection. After the person uses these items, you should wash them thoroughly with soap and water.  Wash laundry thoroughly Immediately remove and wash clothes or bedding that have blood, body fluids, and/or secretions or excretions, such as sweat, saliva, sputum, nasal mucus, vomit, urine, or feces, on them. Wear gloves when handling laundry from the patient. Read and follow directions on labels of laundry or clothing items and detergent. In general, wash and dry with the warmest temperatures recommended on the label.  Clean all areas the individual has used often Clean all touchable surfaces, such as counters, tabletops, doorknobs, bathroom fixtures, toilets, phones, keyboards, tablets, and bedside tables, every day. Also, clean any surfaces that may have blood, body fluids, and/or secretions or excretions on them. Wear gloves when cleaning surfaces the patient has come in contact with. Use a diluted bleach solution (e.g., dilute bleach with 1 part bleach and 10 parts water) or a household disinfectant with a label that says EPA-registered for coronaviruses. To make a  bleach solution at home, add 1 tablespoon of bleach to 1 quart (4 cups) of water. For a larger supply, add  cup of bleach to 1 gallon (16 cups) of water. Read labels of cleaning products and follow recommendations provided on product labels. Labels contain instructions for safe and effective use of the cleaning product including precautions you should take when applying the product, such as wearing gloves or eye protection and making sure you  have good ventilation during use of the product. Remove gloves and wash hands immediately after cleaning.  Monitor yourself for signs and symptoms of illness Caregivers and household members are considered close contacts, should monitor their health, and will be asked to limit movement outside of the home to the extent possible. Follow the monitoring steps for close contacts listed on the symptom monitoring form.   ? If you have additional questions, contact your local health department or call the epidemiologist on call at [phone] (available 24/7). ? This guidance is subject to change. For the most up-to-date guidance from [facility], please refer to their website: [email or web address]

## 2020-11-20 NOTE — Telephone Encounter (Signed)
Received self-referral on behalf of his daughter re: Covid symptoms and the use of the monoclonal antibody infusion for those with mild to moderate Covid symptoms and at a high risk of hospitalization.     Pt appears to qualify for this infusion due to multiple co-morbid conditions and/or a member of an at-risk group in accordance with the FDA Emergency Use Authorization.   I spoke with his daughter Marzetta Board. They are both in [facility] now waiting for possible infusion therapy. His symptoms started Monday 11/29. It would be best to administer in ER if they have capacity given we have no open appointments until Tuesday afternoon.   I sent her a mychart message with [facility] COVID hotline for other treatment facilities and will place on cancellation list for Monday.    Janene Madeira, MSN, NP-C [facility] for Infectious Disease [email or web address] Pager: [phone] Office: [phone] [place]: [phone]

## 2020-11-20 NOTE — ED Triage Notes (Signed)
Pt arrives with family with c/o cough and upper back pain. Pt denies shob when at rest, increases with ambulation. Pts daughter wants pt to receive monoclonal antibody infusion.

## 2020-11-22 ENCOUNTER — Telehealth: Payer: Self-pay

## 2020-11-22 NOTE — Telephone Encounter (Signed)
Merlin alert received for HVR logged for 14 seconds w/ rate 180's bpm; EGM suggest SVT. Ongoing AT noted w/ V rates 90-140's.   Patient was seen in the ED 11/20/20, dx with covid. Patient is currently asymptomatic. Reports he was unaware of any elevated heart rates. Currently under going infusions for Covid. Compliant with all medications. Patient will call with any questions or concerns.   Manual transmission received to see presenting. AS/VS 120.   Routing to Dr. Lovena Le to review.

## 2020-11-29 ENCOUNTER — Ambulatory Visit (INDEPENDENT_AMBULATORY_CARE_PROVIDER_SITE_OTHER): Payer: Medicare Other | Admitting: Podiatry

## 2020-11-29 ENCOUNTER — Other Ambulatory Visit: Payer: Self-pay

## 2020-11-29 DIAGNOSIS — L97501 Non-pressure chronic ulcer of other part of unspecified foot limited to breakdown of skin: Secondary | ICD-10-CM | POA: Diagnosis not present

## 2020-11-29 DIAGNOSIS — M869 Osteomyelitis, unspecified: Secondary | ICD-10-CM

## 2020-11-29 DIAGNOSIS — I2510 Atherosclerotic heart disease of native coronary artery without angina pectoris: Secondary | ICD-10-CM | POA: Diagnosis not present

## 2020-12-03 NOTE — Telephone Encounter (Signed)
Atrial tachycardia. No change

## 2020-12-06 NOTE — Progress Notes (Signed)
Subjective: [name]. is a 78 y.o. is seen today in office s/p right fourth amputation preformed on 08/17/2020.  He states he is doing well not having any new issues today.  The wound appears to be healed from the surgery. Denies any fevers, chills, nausea, vomiting.  No calf pain, chest pain, shortness of breath.  No other concerns today.   Objective: General: No acute distress, AAOx3  DP/PT pulses palpable 2/4, CRT < 3 sec to all digits.  Right foot: Incision is well coapted without any evidence of dehiscence and scar is formed and the incision is well-healed. Also there was macerated tissue on the sulcus of the fifth toe which is resolved as well.  There is no open lesions or skin breakdown identified today. No other open lesions or pre-ulcerative lesions.  No pain with calf compression, swelling, warmth, erythema.   Assessment and Plan:  Status post status post right fourth amputation, doing well with no complications -healed  -Treatment options discussed including all alternatives, risks, and complications -Overall doing well and the incisions appear to be healed there is no open lesions.  Continue with regular, supportive shoes discussed importance daily foot inspection.  Return in about 3 months (around 02/27/2021).  Trula Slade DPM

## 2020-12-07 DIAGNOSIS — R059 Cough, unspecified: Secondary | ICD-10-CM | POA: Diagnosis not present

## 2020-12-07 DIAGNOSIS — J189 Pneumonia, unspecified organism: Secondary | ICD-10-CM | POA: Diagnosis not present

## 2020-12-07 DIAGNOSIS — E1121 Type 2 diabetes mellitus with diabetic nephropathy: Secondary | ICD-10-CM | POA: Diagnosis not present

## 2020-12-07 DIAGNOSIS — Z8616 Personal history of COVID-19: Secondary | ICD-10-CM | POA: Diagnosis not present

## 2020-12-07 DIAGNOSIS — J1282 Pneumonia due to coronavirus disease 2019: Secondary | ICD-10-CM | POA: Diagnosis not present

## 2020-12-07 DIAGNOSIS — R161 Splenomegaly, not elsewhere classified: Secondary | ICD-10-CM | POA: Diagnosis not present

## 2020-12-21 ENCOUNTER — Ambulatory Visit (INDEPENDENT_AMBULATORY_CARE_PROVIDER_SITE_OTHER): Payer: Medicare Other

## 2020-12-21 DIAGNOSIS — I495 Sick sinus syndrome: Secondary | ICD-10-CM | POA: Diagnosis not present

## 2020-12-21 LAB — CUP PACEART REMOTE DEVICE CHECK
Battery Remaining Longevity: 121 mo
Battery Remaining Percentage: 95.5 %
Battery Voltage: 3.01 V
Brady Statistic AP VP Percent: 2 %
Brady Statistic AP VS Percent: 3.6 %
Brady Statistic AS VP Percent: 1 %
Brady Statistic AS VS Percent: 92 %
Brady Statistic RA Percent Paced: 5 %
Brady Statistic RV Percent Paced: 2.1 %
Date Time Interrogation Session: 20220104042655
Implantable Lead Implant Date: 20090420
Implantable Lead Implant Date: 20090420
Implantable Lead Location: 753859
Implantable Lead Location: 753860
Implantable Pulse Generator Implant Date: 20200708
Lead Channel Impedance Value: 490 Ohm
Lead Channel Impedance Value: 560 Ohm
Lead Channel Pacing Threshold Amplitude: 0.75 V
Lead Channel Pacing Threshold Amplitude: 1 V
Lead Channel Pacing Threshold Pulse Width: 0.4 ms
Lead Channel Pacing Threshold Pulse Width: 0.4 ms
Lead Channel Sensing Intrinsic Amplitude: 1.1 mV
Lead Channel Sensing Intrinsic Amplitude: 8.4 mV
Lead Channel Setting Pacing Amplitude: 2 V
Lead Channel Setting Pacing Amplitude: 2.5 V
Lead Channel Setting Pacing Pulse Width: 0.4 ms
Lead Channel Setting Sensing Sensitivity: 2 mV
Pulse Gen Model: 2272
Pulse Gen Serial Number: 9149180

## 2021-01-04 NOTE — Progress Notes (Signed)
Remote pacemaker transmission.   

## 2021-01-25 NOTE — Progress Notes (Signed)
HPI: FU CAD and atrial fibrillation. He underwent CABG (LIMA-LAD, SVG-OM 2, SVG-PDA) along with modified Cox Maze IV procedure in 12/2007. 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 Abdominal US 3/12: No aneurysm. 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 He underwent craniotomy and evacuation by Dr. Saintclair Halsted. He was taken off of Coumadin and no longer felt to be an anticoagulation candidate.Multaq DCed previously as felt causing pulmonary toxixity. Nuclear study 5/18 showed EF 59 with normal perfusion. EchocardiogramMay 2020 showed ejection fraction 50 to 55%, moderate right ventricular enlargement, mildly reduced RV function, severe tricuspid regurgitation. ABIs June 2020 normal. Carotid Dopplers June 2020 showed 1 to 39% bilateral stenosis. Transcranial Dopplers June 2020 negative.Chest CT August 2020 showed mild to moderate pulmonary fibrosis. Pulmonary function test November 2020 normal. Patient fell February 2021 and fractured his hip. 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 Since last seen,he has some dyspnea with more vigorous activities.  No orthopnea or PND.  Chronic pedal edema.  No chest pain or syncope.  Current Outpatient Medications  Medication Sig Dispense Refill  . acetaminophen (TYLENOL) 325 MG tablet Take 1-2 tablets (325-650 mg total) by mouth every 4 (four) hours as needed for mild pain.    [name] allopurinol (ZYLOPRIM) 300 MG tablet Take 1 tablet (300 mg total) by mouth daily. 30 tablet 0  . doxycycline (VIBRAMYCIN) 100 MG capsule Take 1 capsule (100 mg total) by mouth 2 (two) times daily. 14 capsule 0  . ferrous sulfate 325 (65 FE) MG tablet Take 1 tablet (325 mg total) by mouth daily with breakfast. 30 tablet 3  . memantine (NAMENDA) 10 MG tablet Take 10 mg by mouth 2 (two) times daily.    . metoprolol succinate (TOPROL-XL) 25 MG 24 hr tablet Take 0.5  tablets (12.5 mg total) by mouth daily. 30 tablet 0  . mirabegron ER (MYRBETRIQ) 50 MG TB24 tablet Take 1 tablet (50 mg total) by mouth daily. 30 tablet 3  . modafinil (PROVIGIL) 100 MG tablet Take 0.5 tablets (50 mg total) by mouth daily. 30 tablet 1  . Multiple Vitamin (MULTIVITAMIN WITH MINERALS) TABS tablet Take 1 tablet by mouth daily.    . pantoprazole (PROTONIX) 40 MG tablet Take 1 tablet (40 mg total) by mouth daily. 30 tablet 0  . rosuvastatin (CRESTOR) 40 MG tablet Take 1 tablet (40 mg total) by mouth daily. 90 tablet 2  . sertraline (ZOLOFT) 100 MG tablet Take 2 tablets (200 mg total) by mouth daily. 30 tablet 0  . tamsulosin (FLOMAX) 0.4 MG CAPS capsule Take 1 capsule (0.4 mg total) by mouth at bedtime. 30 capsule 1  . torsemide (DEMADEX) 20 MG tablet TAKE 2 TABLETS IN THE MORNING AND 1 TABLET IN THE AFTERNOON 270 tablet 3   No current facility-administered medications for this visit.     Past Medical History:  Diagnosis Date  . (HFpEF) heart failure with preserved ejection fraction ([place])    a. 05/2013 Echo: EF 55%, mild LVH, diast dysfxn, Ao sclerosis, mildly dil LA, RV dysfxn (poorly visualized), PASP 39mmHg;  b. 03/2017 Echo: EF 55-60%, no rwma, triv MR, mildly dil RV, mod TR, PASP 42mmHg.  . Atrial fibrillation [facility])    s/p Cox Maze 1/09;  Multaq Rx d/c'd in 2014 due to pulmo fibrosis;  coumadin d/c'd in 2014 due to spontaneous subdural hematoma  . BPH (benign prostatic hyperplasia)   . CAD (coronary  artery disease), native coronary artery    a. s/p CABG 12/2007;  b. Myoview 12/2011: EF 66%, no scar or ischemia; normal.  . DM (diabetes mellitus) ([place])   . Hyperlipidemia type II   . Hypertension   . 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 OSA (obstructive sleep apnea)   . Pacemaker    PPM - St. Jude  . Peripheral neuropathy 07/31/2018  . Pulmonary fibrosis ([place])    Multaq d/c'd 7/14  . Subdural hematoma ([place]) 07/2012   spontaneous;   coumadin d/c'd => no longer a candidate for anticoagulation    Past Surgical History:  Procedure Laterality Date  . AMPUTATION Left 05/17/2019   Procedure: AMPUTATION LEFT FOURTH TOE;  Surgeon: Meredith Pel, MD;  Location: [place];  Service: Orthopedics;  Laterality: Left;  . AMPUTATION TOE Right 02/06/2020   Procedure: AMPUTATION TOE;  Surgeon: Newt Minion, MD;  Location: [place];  Service: Orthopedics;  Laterality: Right;  . AMPUTATION TOE Right 08/17/2020   Procedure: AMPUTATION TOE 4th toe;  Surgeon: Trula Slade, DPM;  Location: [facility];  Service: Podiatry;  Laterality: Right;  . APPENDECTOMY    . CHOLECYSTECTOMY    . CORONARY ARTERY BYPASS GRAFT     x3 (left internal mammary artery to distal left anterior descending coronary artery, saphenous vain graft to second circumflex marginal branch, saphenous vain graft to posterior descending coronary artery, endoscopic saphenous vain harvest from right thigh) and modified Cox - Maze IV procedure.  [name],MD. Electronically signed CHO/MEDQ D: 01/09/2008/ JOB: [id number] cc:  [name]  . [name]  07/30/2012   Procedure: CRANIOTOMY HEMATOMA EVACUATION SUBDURAL;  Surgeon: [name], MD;  Location: [place] [facility];  Service: Neurosurgery;  Laterality: Right;  Right craniotomy for evacuation of subdural hematoma  . FOOT SURGERY    . HERNIA REPAIR    . INTRAMEDULLARY (IM) NAIL INTERTROCHANTERIC Right 02/04/2020   Procedure: INTRAMEDULLARY (IM) NAIL INTERTROCHANTRIC;  Surgeon: Netta Cedars, MD;  Location: [place];  Service: Orthopedics;  Laterality: Right;  . ORCHIECTOMY     Left  /  testicular cancer  . PACEMAKER PLACEMENT     PPM - St. Jude  . PPM GENERATOR CHANGEOUT N/A 06/25/2019   Procedure: PPM GENERATOR CHANGEOUT;  Surgeon: Evans Lance, MD;  Location: [place] [facility];  Service: Cardiovascular;  Laterality: N/A;    Social History   Socioeconomic History  . Marital status: Married    Spouse name: [name]  .  Number of children: 2  . Years of education: Not on file  . Highest education level: Not on file  Occupational History  . Occupation: Retired- IT trainer  Tobacco Use  . Smoking status: Former Smoker    Packs/day: 2.00    Years: 20.00    Pack years: 40.00    Types: Cigarettes    Quit date: 02/21/1991    Years since quitting: 29.9  . Smokeless tobacco: Never Used  Vaping Use  . Vaping Use: Never used  Substance and Sexual Activity  . Alcohol use: No    Alcohol/week: 0.0 standard drinks  . Drug use: No  . Sexual activity: Not Currently  Other Topics Concern  . Not on file  Social History Narrative   Lives with wife   Right handed    Married with two children.     He is a Engineer, structural.     Social Determinants of Health   Financial  Resource Strain: Not on file  Food Insecurity: Not on file  Transportation Needs: Not on file  Physical Activity: Not on file  Stress: Not on file  Social Connections: Not on file  Intimate Partner Violence: Not on file    Family History  Problem Relation Age of Onset  . Heart disease Father   . Heart attack Father   . Heart failure Father   . Heart disease Mother   . Alzheimer's disease Mother   . Dementia Mother     ROS: no fevers or chills, productive cough, hemoptysis, dysphasia, odynophagia, melena, hematochezia, dysuria, hematuria, rash, seizure activity, orthopnea, PND, pedal edema, claudication. Remaining systems are negative.  Physical Exam: Well-developed well-nourished in no acute distress.  Skin is warm and dry.  HEENT is normal.  Neck is supple.  Chest is clear to auscultation with normal expansion.  Cardiovascular exam is regular rate and rhythm.  Abdominal exam nontender or distended. No masses palpated. Extremities show no edema. neuro grossly intact  ECG-probable ectopic atrial tachycardia, right bundle branch block. Personally reviewed  A/P  1 chronic right-sided congestive heart failure-patient appears to  be doing reasonably well from a volume standpoint.  Continue Demadex.  Check potassium and renal function.  Note is right-sided CHF is felt secondary to pulmonary venous hypertension, pulmonary fibrosis and sleep apnea.  2 paroxysmal atrial fibrillation-We will continue beta-blocker.  He has not been on anticoagulation given history of spontaneous subdural hematoma.  3 ectopic atrial tachycardia-continue beta-blocker.  4 severe tricuspid regurgitation-felt secondary to right ventricular enlargement due to elevated pulmonary pressures versus trauma from prior pacemaker.  Patient is not a surgical candidate given multiple comorbidities.  5 coronary artery disease-continue statin.  No aspirin given history of intracranial hemorrhage.  6 pacemaker-Per electrophysiology.  7 hyperlipidemia-continue statin.  8 history of cirrhosis/pulmonary fibrosis/chronic anemia/thrombocytopenia-followed by primary care.  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  Kirk Ruths, MD

## 2021-02-01 ENCOUNTER — Encounter: Payer: Self-pay | Admitting: Cardiology

## 2021-02-01 ENCOUNTER — Ambulatory Visit (INDEPENDENT_AMBULATORY_CARE_PROVIDER_SITE_OTHER): Payer: Medicare Other | Admitting: Cardiology

## 2021-02-01 ENCOUNTER — Other Ambulatory Visit: Payer: Self-pay

## 2021-02-01 VITALS — BP 126/72 | HR 109 | Wt 205.0 lb

## 2021-02-01 DIAGNOSIS — I480 Paroxysmal atrial fibrillation: Secondary | ICD-10-CM | POA: Diagnosis not present

## 2021-02-01 DIAGNOSIS — I50812 Chronic right heart failure: Secondary | ICD-10-CM | POA: Diagnosis not present

## 2021-02-01 DIAGNOSIS — I2510 Atherosclerotic heart disease of native coronary artery without angina pectoris: Secondary | ICD-10-CM | POA: Diagnosis not present

## 2021-02-01 DIAGNOSIS — I10 Essential (primary) hypertension: Secondary | ICD-10-CM

## 2021-02-01 NOTE — Patient Instructions (Signed)

## 2021-02-02 LAB — BASIC METABOLIC PANEL
BUN/Creatinine Ratio: 22 (ref 10–24)
BUN: 25 mg/dL (ref 8–27)
CO2: 23 mmol/L (ref 20–29)
Calcium: 9.2 mg/dL (ref 8.6–10.2)
Chloride: 105 mmol/L (ref 96–106)
Creatinine, Ser: 1.13 mg/dL (ref 0.76–1.27)
GFR calc Af Amer: 72 mL/min/{1.73_m2} (ref >59)
GFR calc non Af Amer: 62 mL/min/{1.73_m2} (ref >59)
Glucose: 106 mg/dL — ABNORMAL HIGH (ref 65–99)
Potassium: 4.2 mmol/L (ref 3.5–5.2)
Sodium: 142 mmol/L (ref 134–144)

## 2021-02-08 ENCOUNTER — Encounter: Payer: Self-pay | Admitting: *Deleted

## 2021-02-16 DIAGNOSIS — L821 Other seborrheic keratosis: Secondary | ICD-10-CM | POA: Diagnosis not present

## 2021-02-16 DIAGNOSIS — L570 Actinic keratosis: Secondary | ICD-10-CM | POA: Diagnosis not present

## 2021-02-16 DIAGNOSIS — D225 Melanocytic nevi of trunk: Secondary | ICD-10-CM | POA: Diagnosis not present

## 2021-02-16 DIAGNOSIS — Z85828 Personal history of other malignant neoplasm of skin: Secondary | ICD-10-CM | POA: Diagnosis not present

## 2021-02-28 ENCOUNTER — Ambulatory Visit (INDEPENDENT_AMBULATORY_CARE_PROVIDER_SITE_OTHER): Payer: Medicare Other | Admitting: Podiatry

## 2021-02-28 ENCOUNTER — Other Ambulatory Visit: Payer: Self-pay

## 2021-02-28 DIAGNOSIS — M79674 Pain in right toe(s): Secondary | ICD-10-CM | POA: Diagnosis not present

## 2021-02-28 DIAGNOSIS — B351 Tinea unguium: Secondary | ICD-10-CM

## 2021-02-28 DIAGNOSIS — M79675 Pain in left toe(s): Secondary | ICD-10-CM | POA: Diagnosis not present

## 2021-03-06 NOTE — Progress Notes (Signed)
Subjective: [name]. is a 80 y.o. is seen today for foot evaluation.  He previously had a right fourth amputation in August 2021 which he has done well from.  He does not report any new ulcerations or any new issues.  His nails are elongated which cause discomfort at times.  Denies any fevers, chills, nausea, vomiting.  No calf pain, chest pain, shortness of breath.  No other concerns today.   Objective: General: No acute distress DP/PT pulses palpable 2/4, CRT < 3 sec to all digits.  Incision site on the right side from the previous amputation is well-healed.  Also other irritations are noted.  The nails appear to be hypertrophic, dystrophic to the left 1, 2, 3, 5 in the right 1, 3, 5.  No edema, erythema or signs of infection of the toenail sites noted today.. No other open lesions or pre-ulcerative lesions.  No pain with calf compression, swelling, warmth, erythema.   Assessment and Plan:  Daily foot exam, symptomatic onychomycosis  -Treatment options discussed including all alternatives, risks, and complications -Discussed the importance of daily foot inspection proper shoe gear. -Sharply debrided nails x7 without any complications or bleeding  Return in about 3 months (around 05/31/2021).  Trula Slade DPM

## 2021-03-08 ENCOUNTER — Ambulatory Visit (INDEPENDENT_AMBULATORY_CARE_PROVIDER_SITE_OTHER): Payer: Medicare Other | Admitting: Adult Health

## 2021-03-08 VITALS — BP 112/73 | HR 104 | Ht 68.0 in | Wt 201.0 lb

## 2021-03-08 DIAGNOSIS — Z9989 Dependence on other enabling machines and devices: Secondary | ICD-10-CM

## 2021-03-08 DIAGNOSIS — I2510 Atherosclerotic heart disease of native coronary artery without angina pectoris: Secondary | ICD-10-CM | POA: Diagnosis not present

## 2021-03-08 DIAGNOSIS — G4711 Idiopathic hypersomnia with long sleep time: Secondary | ICD-10-CM

## 2021-03-08 DIAGNOSIS — G4733 Obstructive sleep apnea (adult) (pediatric): Secondary | ICD-10-CM | POA: Diagnosis not present

## 2021-03-08 MED ORDER — MODAFINIL 100 MG PO TABS: 50.0000 mg | ORAL_TABLET | Freq: Every day | ORAL | 5 refills | Status: DC

## 2021-03-08 NOTE — Patient Instructions (Addendum)
Continue using CPAP nightly and greater than 4 hours each night start Modafinil 50 mg daily If your symptoms worsen or you develop new symptoms please let us know.

## 2021-03-08 NOTE — Progress Notes (Signed)
PATIENT: [name]. DOB: May 10, 1941  REASON FOR VISIT: follow up HISTORY FROM: patient  HISTORY OF PRESENT ILLNESS: Today 03/08/21:  [name] is a 80 year old male with a history of obstructive sleep apnea on CPAP and excessive daytime sleepiness.  He returns today for follow-up.  He reports that there are some nights that he does not sleep greater than 4 hours.  He states that he also sleeps a lot during the day.  He does not think he ever got the prescription for modafinil.  He will check at home and ensure that he is not taking his medicine.  Wife reports that they can be driving and he will fall asleep in the car.  He does not operate a motor vehicle.  She states that she can have a conversation with him and he falls asleep during the conversation.    09/08/20: [name] is a 80 year old male with a history of obstructive sleep apnea on CPAP.  His download indicates that he uses machine 21 out of 30 days for compliance of 70%.  He uses machine greater than 4 hours 19 days for compliance of 63%.  On average he uses his machine 6 hours and 33 minutes.  His residual AHI is 3.6 on 6 to 16 cm of water with EPR of 3.  Leak in the 95th percentile is 43.2 L/min.  He reports that he just was able to get modafinil last night.  He has not taken a dose yet.  HISTORY Patient CPAP download indicates that he did use his machine nightly for compliance of 100%. He uses machine greater than 4 hours 16 days for compliance of 53%. On average he uses his machine 4 hours and 35 minutes. His residual AHI is 3.7 on 6 to 16 cm of water with EPR of 3. Leak in the 95th percentile is 29.6 L/min.  The patient and his wife state that he continues to nap throughout the day. Wife reports that he typically goes back to bed around 2 PM and will not get up to 5 PM. Reports that he has several naps throughout the day. Patient reports that he would like to be more awake during the day. He is interested in trying  medication  REVIEW OF SYSTEMS: Out of a complete 14 system review of symptoms, the patient complains only of the following symptoms, and all other reviewed systems are negative.  See HPI  ALLERGIES: Allergies  Allergen Reactions  . Aricept [Donepezil] Other (See Comments)    Hallucination   . Lipitor [Atorvastatin] Other (See Comments)    Stiff joints  . 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 Warfarin And Related Other (See Comments)    Stiff joints  . Enbrel [Etanercept] Itching    HOME MEDICATIONS: Outpatient Medications Prior to Visit  Medication Sig Dispense Refill  . acetaminophen (TYLENOL) 325 MG tablet Take 1-2 tablets (325-650 mg total) by mouth every 4 (four) hours as needed for mild pain.    [name] allopurinol (ZYLOPRIM) 300 MG tablet Take 1 tablet (300 mg total) by mouth daily. 30 tablet 0  . doxycycline (VIBRAMYCIN) 100 MG capsule Take 1 capsule (100 mg total) by mouth 2 (two) times daily. 14 capsule 0  . ferrous sulfate 325 (65 FE) MG tablet Take 1 tablet (325 mg total) by mouth daily with breakfast. 30 tablet 3  . memantine (NAMENDA) 10 MG tablet Take 10 mg by mouth 2 (two)  times daily.    . metoprolol succinate (TOPROL-XL) 25 MG 24 hr tablet Take 0.5 tablets (12.5 mg total) by mouth daily. 30 tablet 0  . mirabegron ER (MYRBETRIQ) 50 MG TB24 tablet Take 1 tablet (50 mg total) by mouth daily. 30 tablet 3  . modafinil (PROVIGIL) 100 MG tablet Take 0.5 tablets (50 mg total) by mouth daily. 30 tablet 1  . Multiple Vitamin (MULTIVITAMIN WITH MINERALS) TABS tablet Take 1 tablet by mouth daily.    . pantoprazole (PROTONIX) 40 MG tablet Take 1 tablet (40 mg total) by mouth daily. 30 tablet 0  . rosuvastatin (CRESTOR) 40 MG tablet Take 1 tablet (40 mg total) by mouth daily. 90 tablet 2  . sertraline (ZOLOFT) 100 MG tablet Take 2 tablets (200 mg total) by mouth daily. 30 tablet 0  . tamsulosin (FLOMAX) 0.4 MG CAPS capsule Take 1 capsule (0.4 mg total) by mouth  at bedtime. 30 capsule 1  . torsemide (DEMADEX) 20 MG tablet TAKE 2 TABLETS IN THE MORNING AND 1 TABLET IN THE AFTERNOON 270 tablet 3   No facility-administered medications prior to visit.    PAST MEDICAL HISTORY: Past Medical History:  Diagnosis Date  . (HFpEF) heart failure with preserved ejection fraction ([place])    a. 05/2013 Echo: EF 55%, mild LVH, diast dysfxn, Ao sclerosis, mildly dil LA, RV dysfxn (poorly visualized), PASP 19mmHg;  b. 03/2017 Echo: EF 55-60%, no rwma, triv MR, mildly dil RV, mod TR, PASP 17mmHg.  . Atrial fibrillation [facility])    s/p Cox Maze 1/09;  Multaq Rx d/c'd in 2014 due to pulmo fibrosis;  coumadin d/c'd in 2014 due to spontaneous subdural hematoma  . BPH (benign prostatic hyperplasia)   . CAD (coronary artery disease), native coronary artery    a. s/p CABG 12/2007;  b. Myoview 12/2011: EF 66%, no scar or ischemia; normal.  . DM (diabetes mellitus) ([place])   . Hyperlipidemia type II   . Hypertension   . 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 OSA (obstructive sleep apnea)   . Pacemaker    PPM - St. Jude  . Peripheral neuropathy 07/31/2018  . Pulmonary fibrosis ([place])    Multaq d/c'd 7/14  . Subdural hematoma ([place]) 07/2012   spontaneous;  coumadin d/c'd => no longer a candidate for anticoagulation    PAST SURGICAL HISTORY: Past Surgical History:  Procedure Laterality Date  . AMPUTATION Left 05/17/2019   Procedure: AMPUTATION LEFT FOURTH TOE;  Surgeon: Meredith Pel, MD;  Location: [facility];  Service: Orthopedics;  Laterality: Left;  . AMPUTATION TOE Right 02/06/2020   Procedure: AMPUTATION TOE;  Surgeon: Newt Minion, MD;  Location: [place] [facility];  Service: Orthopedics;  Laterality: Right;  . AMPUTATION TOE Right 08/17/2020   Procedure: AMPUTATION TOE 4th toe;  Surgeon: Trula Slade, DPM;  Location: [facility];  Service: Podiatry;  Laterality: Right;  . APPENDECTOMY    . CHOLECYSTECTOMY    . CORONARY ARTERY BYPASS GRAFT     x3  (left internal mammary artery to distal left anterior descending coronary artery, saphenous vain graft to second circumflex marginal branch, saphenous vain graft to posterior descending coronary artery, endoscopic saphenous vain harvest from right thigh) and modified Cox - Maze IV procedure.  [name],MD. Electronically signed CHO/MEDQ D: 01/09/2008/ JOB: [id number] cc:  [name]  . [name]  07/30/2012   Procedure: CRANIOTOMY HEMATOMA EVACUATION SUBDURAL;  Surgeon: [name], MD;  Location: [place]  [facility];  Service: Neurosurgery;  Laterality: Right;  Right craniotomy for evacuation of subdural hematoma  . FOOT SURGERY    . HERNIA REPAIR    . INTRAMEDULLARY (IM) NAIL INTERTROCHANTERIC Right 02/04/2020   Procedure: INTRAMEDULLARY (IM) NAIL INTERTROCHANTRIC;  Surgeon: Netta Cedars, MD;  Location: [facility];  Service: Orthopedics;  Laterality: Right;  . ORCHIECTOMY     Left  /  testicular cancer  . PACEMAKER PLACEMENT     PPM - St. Jude  . PPM GENERATOR CHANGEOUT N/A 06/25/2019   Procedure: PPM GENERATOR CHANGEOUT;  Surgeon: Evans Lance, MD;  Location: [place] CV [facility];  Service: Cardiovascular;  Laterality: N/A;    FAMILY HISTORY: Family History  Problem Relation Age of Onset  . Heart disease Father   . Heart attack Father   . Heart failure Father   . Heart disease Mother   . Alzheimer's disease Mother   . Dementia Mother     SOCIAL HISTORY: Social History   Socioeconomic History  . Marital status: Married    Spouse name: [name]  . Number of children: 2  . Years of education: Not on file  . Highest education level: Not on file  Occupational History  . Occupation: Retired- IT trainer  Tobacco Use  . Smoking status: Former Smoker    Packs/day: 2.00    Years: 20.00    Pack years: 40.00    Types: Cigarettes    Quit date: 02/21/1991    Years since quitting: 30.0  . Smokeless tobacco: Never Used  Vaping Use  . Vaping Use: Never used  Substance and Sexual Activity   . Alcohol use: No    Alcohol/week: 0.0 standard drinks  . Drug use: No  . Sexual activity: Not Currently  Other Topics Concern  . Not on file  Social History Narrative   Lives with wife   Right handed    Married with two children.     He is a Engineer, structural.     Social Determinants of Health   Financial Resource Strain: Not on file  Food Insecurity: Not on file  Transportation Needs: Not on file  Physical Activity: Not on file  Stress: Not on file  Social Connections: Not on file  Intimate Partner Violence: Not on file      PHYSICAL EXAM  Vitals:   03/08/21 1038  BP: 112/73  Pulse: (!) 104  Weight: 201 lb (91.2 kg)  Height: 5\' 8"  (1.727 m)   Body mass index is 30.56 kg/m.  Generalized: Well developed, in no acute distress  Chest: Lungs clear to auscultation bilaterally  Neurological examination  Mentation: Alert oriented to time, place, history taking. Follows all commands speech and language fluent Cranial nerve II-XII: Extraocular movements were full, visual field were full on confrontational test Head turning and shoulder shrug  were normal and symmetric. Motor: The motor testing reveals 5 over 5 strength of all 4 extremities. Good symmetric motor tone is noted throughout.  Sensory: Sensory testing is intact to soft touch on all 4 extremities. No evidence of extinction is noted.  Gait and station: Uses a cane when ambulating  DIAGNOSTIC DATA (LABS, IMAGING, TESTING) - I reviewed patient records, labs, notes, testing and imaging myself where available.  Lab Results  Component Value Date   WBC 4.0 11/01/2020   HGB 10.3 (L) 11/01/2020   HCT 30.9 (L) 11/01/2020   MCV 90.9 11/01/2020   PLT 88 (L) 11/01/2020      Component Value Date/Time  NA 142 02/01/2021 1410   NA 141 02/23/2017 1237   K 4.2 02/01/2021 1410   K 3.8 02/23/2017 1237   CL 105 02/01/2021 1410   CO2 23 02/01/2021 1410   CO2 27 02/23/2017 1237   GLUCOSE 106 (H) 02/01/2021 1410    GLUCOSE 96 11/01/2020 0944   GLUCOSE 113 02/23/2017 1237   BUN 25 02/01/2021 1410   BUN 18.3 02/23/2017 1237   CREATININE 1.13 02/01/2021 1410   CREATININE 1.2 02/23/2017 1237   CALCIUM 9.2 02/01/2021 1410   CALCIUM 9.6 02/23/2017 1237   PROT 7.6 11/01/2020 0944   PROT 7.3 06/18/2018 1205   PROT 8.0 02/23/2017 1237   ALBUMIN 3.8 11/01/2020 0944   ALBUMIN 4.1 09/24/2017 0824   ALBUMIN 4.1 02/23/2017 1237   AST 34 11/01/2020 0944   AST 24 02/23/2017 1237   ALT 26 11/01/2020 0944   ALT 15 02/23/2017 1237   ALKPHOS 96 11/01/2020 0944   ALKPHOS 105 02/23/2017 1237   BILITOT 0.6 11/01/2020 0944   BILITOT 0.6 09/24/2017 0824   BILITOT 0.98 02/23/2017 1237   GFRNONAA 62 02/01/2021 1410   GFRNONAA 51 (L) 11/01/2020 0944   GFRAA 72 02/01/2021 1410   Lab Results  Component Value Date   CHOL 103 05/22/2019   HDL 40 (L) 05/22/2019   LDLCALC 51 05/22/2019   LDLDIRECT 154.2 10/20/2013   TRIG 58 05/22/2019   CHOLHDL 2.6 05/22/2019   Lab Results  Component Value Date   HGBA1C 5.6 05/21/2019   Lab Results  Component Value Date   CBULAGTX64 680 11/07/2019   Lab Results  Component Value Date   TSH 2.022 05/16/2019      ASSESSMENT AND PLAN 80 y.o. year old male  has a past medical history of (HFpEF) heart failure with preserved ejection fraction (HCC), Atrial fibrillation ([place]), BPH (benign prostatic hyperplasia), CAD (coronary artery disease), native coronary artery, DM (diabetes mellitus) ([place]), Hyperlipidemia type II, Hypertension, [name] (monoclonal gammopathy of unknown significance) (07/31/2018), OSA (obstructive sleep apnea), Pacemaker, Peripheral neuropathy (07/31/2018), Pulmonary fibrosis ([place]), and Subdural hematoma ([place]) (07/2012). here with:  Obstructive sleep apnea on CPAP   Download indicates good compliance but not greater than 4 hours due to the patient not sleeping longer at night.  Good treatment of apnea  Encourage patient continue using CPAP nightly and greater  than 4 hours each night  Idiopathic hypersomnia   Start modafinil 50 mg daily if this is not beneficial we can increase to 100 mg daily  I spent 30 minutes of face-to-face and non-face-to-face time with patient.  This included previsit chart review, lab review, study review, order entry, electronic health record documentation, patient education.  Ward Givens, MSN, NP-C 03/08/2021, 10:26 AM [facility] [address] [place], [place] [address] [phone]

## 2021-03-21 DIAGNOSIS — N189 Chronic kidney disease, unspecified: Secondary | ICD-10-CM | POA: Diagnosis not present

## 2021-03-21 DIAGNOSIS — D472 Monoclonal gammopathy: Secondary | ICD-10-CM | POA: Diagnosis not present

## 2021-03-21 DIAGNOSIS — D631 Anemia in chronic kidney disease: Secondary | ICD-10-CM | POA: Diagnosis not present

## 2021-03-21 DIAGNOSIS — M25512 Pain in left shoulder: Secondary | ICD-10-CM | POA: Diagnosis not present

## 2021-03-21 DIAGNOSIS — N1831 Chronic kidney disease, stage 3a: Secondary | ICD-10-CM | POA: Diagnosis not present

## 2021-03-21 DIAGNOSIS — R809 Proteinuria, unspecified: Secondary | ICD-10-CM | POA: Diagnosis not present

## 2021-03-21 DIAGNOSIS — M1000 Idiopathic gout, unspecified site: Secondary | ICD-10-CM | POA: Diagnosis not present

## 2021-03-21 DIAGNOSIS — M25511 Pain in right shoulder: Secondary | ICD-10-CM | POA: Diagnosis not present

## 2021-03-21 DIAGNOSIS — I129 Hypertensive chronic kidney disease with stage 1 through stage 4 chronic kidney disease, or unspecified chronic kidney disease: Secondary | ICD-10-CM | POA: Diagnosis not present

## 2021-03-22 ENCOUNTER — Ambulatory Visit (INDEPENDENT_AMBULATORY_CARE_PROVIDER_SITE_OTHER): Payer: Medicare Other

## 2021-03-22 DIAGNOSIS — I495 Sick sinus syndrome: Secondary | ICD-10-CM | POA: Diagnosis not present

## 2021-03-22 LAB — CUP PACEART REMOTE DEVICE CHECK
Battery Remaining Longevity: 121 mo
Battery Remaining Percentage: 95.5 %
Battery Voltage: 3.01 V
Brady Statistic AP VP Percent: 1.7 %
Brady Statistic AP VS Percent: 5.3 %
Brady Statistic AS VP Percent: 1 %
Brady Statistic AS VS Percent: 91 %
Brady Statistic RA Percent Paced: 6.3 %
Brady Statistic RV Percent Paced: 1.9 %
Date Time Interrogation Session: 20220405020016
Implantable Lead Implant Date: 20090420
Implantable Lead Implant Date: 20090420
Implantable Lead Location: 753859
Implantable Lead Location: 753860
Implantable Pulse Generator Implant Date: 20200708
Lead Channel Impedance Value: 440 Ohm
Lead Channel Impedance Value: 550 Ohm
Lead Channel Pacing Threshold Amplitude: 0.75 V
Lead Channel Pacing Threshold Amplitude: 1 V
Lead Channel Pacing Threshold Pulse Width: 0.4 ms
Lead Channel Pacing Threshold Pulse Width: 0.4 ms
Lead Channel Sensing Intrinsic Amplitude: 0.8 mV
Lead Channel Sensing Intrinsic Amplitude: 6.3 mV
Lead Channel Setting Pacing Amplitude: 2 V
Lead Channel Setting Pacing Amplitude: 2.5 V
Lead Channel Setting Pacing Pulse Width: 0.4 ms
Lead Channel Setting Sensing Sensitivity: 2 mV
Pulse Gen Model: 2272
Pulse Gen Serial Number: 9149180

## 2021-04-01 NOTE — Progress Notes (Signed)
Remote pacemaker transmission.   

## 2021-04-07 DIAGNOSIS — E1121 Type 2 diabetes mellitus with diabetic nephropathy: Secondary | ICD-10-CM | POA: Diagnosis not present

## 2021-04-07 DIAGNOSIS — M109 Gout, unspecified: Secondary | ICD-10-CM | POA: Diagnosis not present

## 2021-04-07 DIAGNOSIS — Z9181 History of falling: Secondary | ICD-10-CM | POA: Diagnosis not present

## 2021-04-07 DIAGNOSIS — R350 Frequency of micturition: Secondary | ICD-10-CM | POA: Diagnosis not present

## 2021-04-07 DIAGNOSIS — M810 Age-related osteoporosis without current pathological fracture: Secondary | ICD-10-CM | POA: Diagnosis not present

## 2021-04-07 DIAGNOSIS — I10 Essential (primary) hypertension: Secondary | ICD-10-CM | POA: Diagnosis not present

## 2021-04-07 DIAGNOSIS — I2510 Atherosclerotic heart disease of native coronary artery without angina pectoris: Secondary | ICD-10-CM | POA: Diagnosis not present

## 2021-04-07 DIAGNOSIS — I5032 Chronic diastolic (congestive) heart failure: Secondary | ICD-10-CM | POA: Diagnosis not present

## 2021-04-07 DIAGNOSIS — Z87310 Personal history of (healed) osteoporosis fracture: Secondary | ICD-10-CM | POA: Diagnosis not present

## 2021-04-11 ENCOUNTER — Other Ambulatory Visit (HOSPITAL_COMMUNITY): Payer: Self-pay

## 2021-04-12 ENCOUNTER — Other Ambulatory Visit (HOSPITAL_COMMUNITY): Payer: Self-pay

## 2021-04-12 MED ORDER — TERIPARATIDE 620 MCG/2.48ML ~~LOC~~ SOPN: PEN_INJECTOR | SUBCUTANEOUS | 11 refills | Status: AC

## 2021-04-13 ENCOUNTER — Other Ambulatory Visit (HOSPITAL_COMMUNITY): Payer: Self-pay

## 2021-04-19 ENCOUNTER — Other Ambulatory Visit (HOSPITAL_COMMUNITY): Payer: Self-pay

## 2021-04-20 ENCOUNTER — Other Ambulatory Visit (HOSPITAL_COMMUNITY): Payer: Self-pay

## 2021-05-02 DIAGNOSIS — M810 Age-related osteoporosis without current pathological fracture: Secondary | ICD-10-CM | POA: Diagnosis not present

## 2021-05-03 ENCOUNTER — Inpatient Hospital Stay: Payer: Medicare Other | Attending: Hematology and Oncology

## 2021-05-03 ENCOUNTER — Other Ambulatory Visit: Payer: Self-pay

## 2021-05-03 DIAGNOSIS — D696 Thrombocytopenia, unspecified: Secondary | ICD-10-CM | POA: Insufficient documentation

## 2021-05-03 DIAGNOSIS — R162 Hepatomegaly with splenomegaly, not elsewhere classified: Secondary | ICD-10-CM | POA: Insufficient documentation

## 2021-05-03 DIAGNOSIS — R296 Repeated falls: Secondary | ICD-10-CM | POA: Insufficient documentation

## 2021-05-03 DIAGNOSIS — Z923 Personal history of irradiation: Secondary | ICD-10-CM | POA: Insufficient documentation

## 2021-05-03 DIAGNOSIS — D509 Iron deficiency anemia, unspecified: Secondary | ICD-10-CM | POA: Insufficient documentation

## 2021-05-03 DIAGNOSIS — Z8547 Personal history of malignant neoplasm of testis: Secondary | ICD-10-CM | POA: Insufficient documentation

## 2021-05-03 DIAGNOSIS — D472 Monoclonal gammopathy: Secondary | ICD-10-CM | POA: Diagnosis not present

## 2021-05-03 LAB — CBC WITH DIFFERENTIAL/PLATELET
Abs Immature Granulocytes: 0.03 10*3/uL (ref 0.00–0.07)
Basophils Absolute: 0 10*3/uL (ref 0.0–0.1)
Basophils Relative: 1 %
Eosinophils Absolute: 0.1 10*3/uL (ref 0.0–0.5)
Eosinophils Relative: 1 %
HCT: 33.9 % — ABNORMAL LOW (ref 39.0–52.0)
Hemoglobin: 11.4 g/dL — ABNORMAL LOW (ref 13.0–17.0)
Immature Granulocytes: 1 %
Lymphocytes Relative: 14 %
Lymphs Abs: 0.7 10*3/uL (ref 0.7–4.0)
MCH: 31.1 pg (ref 26.0–34.0)
MCHC: 33.6 g/dL (ref 30.0–36.0)
MCV: 92.4 fL (ref 80.0–100.0)
Monocytes Absolute: 0.2 10*3/uL (ref 0.1–1.0)
Monocytes Relative: 4 %
Neutro Abs: 3.7 10*3/uL (ref 1.7–7.7)
Neutrophils Relative %: 79 %
Platelets: 87 10*3/uL — ABNORMAL LOW (ref 150–400)
RBC: 3.67 MIL/uL — ABNORMAL LOW (ref 4.22–5.81)
RDW: 15 % (ref 11.5–15.5)
WBC: 4.6 10*3/uL (ref 4.0–10.5)
nRBC: 0 % (ref 0.0–0.2)

## 2021-05-03 LAB — COMPREHENSIVE METABOLIC PANEL
ALT: 27 U/L (ref 0–44)
AST: 36 U/L (ref 15–41)
Albumin: 3.6 g/dL (ref 3.5–5.0)
Alkaline Phosphatase: 92 U/L (ref 38–126)
Anion gap: 8 (ref 5–15)
BUN: 16 mg/dL (ref 8–23)
CO2: 28 mmol/L (ref 22–32)
Calcium: 8.9 mg/dL (ref 8.9–10.3)
Chloride: 106 mmol/L (ref 98–111)
Creatinine, Ser: 1.11 mg/dL (ref 0.61–1.24)
GFR, Estimated: >60 mL/min (ref >60)
Glucose, Bld: 111 mg/dL — ABNORMAL HIGH (ref 70–99)
Potassium: 4 mmol/L (ref 3.5–5.1)
Sodium: 142 mmol/L (ref 135–145)
Total Bilirubin: 0.5 mg/dL (ref 0.3–1.2)
Total Protein: 7.1 g/dL (ref 6.5–8.1)

## 2021-05-03 LAB — IRON AND TIBC
Iron: 46 ug/dL (ref 42–163)
Saturation Ratios: 15 % — ABNORMAL LOW (ref 20–55)
TIBC: 314 ug/dL (ref 202–409)
UIBC: 268 ug/dL (ref 117–376)

## 2021-05-03 LAB — FERRITIN: Ferritin: 97 ng/mL (ref 24–336)

## 2021-05-04 LAB — KAPPA/LAMBDA LIGHT CHAINS
Kappa free light chain: 48.2 mg/L — ABNORMAL HIGH (ref 3.3–19.4)
Kappa, lambda light chain ratio: 1.86 — ABNORMAL HIGH (ref 0.26–1.65)
Lambda free light chains: 25.9 mg/L (ref 5.7–26.3)

## 2021-05-07 LAB — MULTIPLE MYELOMA PANEL, SERUM
Albumin SerPl Elph-Mcnc: 3.8 g/dL (ref 2.9–4.4)
Albumin/Glob SerPl: 1.3 (ref 0.7–1.7)
Alpha 1: 0.2 g/dL (ref 0.0–0.4)
Alpha2 Glob SerPl Elph-Mcnc: 0.7 g/dL (ref 0.4–1.0)
B-Globulin SerPl Elph-Mcnc: 1.3 g/dL (ref 0.7–1.3)
Gamma Glob SerPl Elph-Mcnc: 0.9 g/dL (ref 0.4–1.8)
Globulin, Total: 3.1 g/dL (ref 2.2–3.9)
IgA: 602 mg/dL — ABNORMAL HIGH (ref 61–437)
IgG (Immunoglobin G), Serum: 908 mg/dL (ref 603–1613)
IgM (Immunoglobulin M), Srm: 84 mg/dL (ref 15–143)
M Protein SerPl Elph-Mcnc: 0.3 g/dL — ABNORMAL HIGH
Total Protein ELP: 6.9 g/dL (ref 6.0–8.5)

## 2021-05-10 ENCOUNTER — Inpatient Hospital Stay (HOSPITAL_BASED_OUTPATIENT_CLINIC_OR_DEPARTMENT_OTHER): Payer: Medicare Other | Admitting: Hematology and Oncology

## 2021-05-10 ENCOUNTER — Other Ambulatory Visit: Payer: Self-pay

## 2021-05-10 ENCOUNTER — Telehealth: Payer: Self-pay | Admitting: Hematology and Oncology

## 2021-05-10 ENCOUNTER — Encounter: Payer: Self-pay | Admitting: Hematology and Oncology

## 2021-05-10 DIAGNOSIS — R162 Hepatomegaly with splenomegaly, not elsewhere classified: Secondary | ICD-10-CM | POA: Diagnosis not present

## 2021-05-10 DIAGNOSIS — D696 Thrombocytopenia, unspecified: Secondary | ICD-10-CM | POA: Diagnosis not present

## 2021-05-10 DIAGNOSIS — R296 Repeated falls: Secondary | ICD-10-CM | POA: Diagnosis not present

## 2021-05-10 DIAGNOSIS — Z923 Personal history of irradiation: Secondary | ICD-10-CM | POA: Diagnosis not present

## 2021-05-10 DIAGNOSIS — D509 Iron deficiency anemia, unspecified: Secondary | ICD-10-CM

## 2021-05-10 DIAGNOSIS — D472 Monoclonal gammopathy: Secondary | ICD-10-CM

## 2021-05-10 NOTE — Assessment & Plan Note (Signed)
His anemia is slightly improved He is still mildly iron deficient He appears to tolerate oral iron supplement well and he will continue the same

## 2021-05-10 NOTE — Progress Notes (Signed)
[facility] OFFICE PROGRESS NOTE  Patient Care Team: [name], [name] [name], [name], [name], DO as Referring Physician (Family Medicine)  ASSESSMENT & PLAN:  [name] (monoclonal gammopathy of unknown significance) His recent myeloma panel was just borderline abnormal Overall, his studies are stable He will continue [name] blood work in 1 year  Iron deficiency anemia His anemia is slightly improved He is still mildly iron deficient He appears to tolerate oral iron supplement well and he will continue the same  Thrombocytopenia [employer]) 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 Even though the CT imaging report from September 2017 did not disclose this, I reviewed the imaging study with the patient extensively which clearly showed mild liver enlargement and splenomegaly. Rarely, autoimmune disorder that could cause pulmonary fibrosis can also cause mild thrombocytopenia. He is not symptomatic.  Previous autoimmune screen, hepatitis C screening and serum vitamin B12 were within normal limits Observe only for now His platelet count is stable   Orders Placed This Encounter  Procedures  . CBC with Differential/Platelet    Standing Status:   Standing    Number of Occurrences:   22    Standing Expiration Date:   05/10/2022  . Comprehensive metabolic panel    Standing Status:   Standing    Number of Occurrences:   33    Standing Expiration Date:   05/10/2022  . Ferritin    Standing Status:   Future    Standing Expiration Date:   05/10/2022  . Iron and TIBC    Standing Status:   Future    Standing Expiration Date:   05/10/2022  . Kappa/lambda light chains    Standing Status:   Standing    Number of Occurrences:   22    Standing Expiration Date:   05/10/2022  . Multiple Myeloma Panel  (SPEP&IFE w/QIG)    Standing Status:   Standing    Number of Occurrences:   22    Standing Expiration Date:   05/10/2022    All questions were answered. The patient knows to call the clinic with any problems, questions or concerns. The total time spent in the appointment was 20 minutes encounter with patients including review of chart and various tests results, discussions about plan of care and coordination of care plan   [name], MD 05/10/2021 2:28 PM  INTERVAL HISTORY: Please see below for problem oriented charting. He returns by himself today He continues to have recurrent falls at home, sometimes due to weakness, sometimes tripping over objects He came today with a cane He stated he has a walker at home The patient denies any recent signs or symptoms of bleeding such as spontaneous epistaxis, hematuria or hematochezia. He is taking oral iron supplement daily and tolerated that well  SUMMARY OF ONCOLOGIC HISTORY:  Jamie G Dominique Jr. is here because of thrombocytopenia. 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  He was found to have abnormal CBC from abnormal blood work from routine blood work monitoring. I have reviewed outside records from his primary care doctor.  He is noted to have mild pancytopenia since 11/17/2015. On 11/17/2015, white blood cell count 5.9, hemoglobin 12.7 and platelet count 106 On 03/13/2016, white blood cell count 6.6, hemoglobin 12.6  and platelet count 103 On 09/06/2015, white blood cell count 6.6, hemoglobin 11.9 and platelet count 102 On 10/25/2016, white count 5.2, hemoglobin 12.7 and platelet count 86 On 11/08/16, white blood cell count 5.3, hemoglobin 12.2 implant, 107. On 09/05/2016, serum ferritin was high at 220, iron study showed serum iron low at 24 and 9% iron saturation.  Some older scanned records dated back to 12/13/2011 showed low  platelet count of 101 On 12/21/2011, he had normal platelet count of 210 On 07/30/2012, his platelet count was borderline low at 143  On 09/08/2016, [name], such as spontaneous epistaxis, hematuria, 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  He is being observed Repeat CT imaging of the abdomen and pelvis in August 2019 confirmed liver cirrhosis and splenomegaly He was found to have iron deficiency anemia and was placed on oral iron supplement The patient also have [name] and is being observed In October 2020, he received intravenous iron infusion  REVIEW OF SYSTEMS:   Constitutional: Denies fevers, chills or abnormal weight loss Eyes: Denies blurriness of vision Ears, nose, mouth, throat, and face: Denies mucositis or sore throat Respiratory: Denies cough, dyspnea or wheezes Cardiovascular: Denies palpitation, chest discomfort or lower extremity swelling Gastrointestinal:  Denies nausea, heartburn or change in bowel habits Skin: Denies abnormal skin rashes Lymphatics: Denies new lymphadenopathy Neurological:Denies numbness, tingling or new weaknesses Behavioral/Psych: Mood is stable, no new changes  All other systems were reviewed with the patient and are negative.  I have reviewed the past medical history, past surgical history, social  history and family history with the patient and they are unchanged from previous note.  ALLERGIES:  is allergic to aricept [donepezil], lipitor [atorvastatin], nsaids, warfarin and related, and enbrel [etanercept].  MEDICATIONS:  Current Outpatient Medications  Medication Sig Dispense Refill  . acetaminophen (TYLENOL) 325 MG tablet Take 1-2 tablets (325-650 mg total) by mouth every 4 (four) hours as needed for mild pain.    [name] allopurinol (ZYLOPRIM) 300 MG tablet Take 1 tablet (300 mg total) by mouth daily. 30 tablet 0  . doxycycline (VIBRAMYCIN) 100 MG capsule Take 1 capsule (100 mg total) by mouth 2 (two) times daily. 14 capsule 0  . ferrous sulfate 325 (65 FE) MG tablet Take 1 tablet (325 mg total) by mouth daily with breakfast. 30 tablet 3  . memantine (NAMENDA) 10 MG tablet Take 10 mg by mouth 2 (two) times daily.    . metoprolol succinate (TOPROL-XL) 25 MG 24 hr tablet Take 0.5 tablets (12.5 mg total) by mouth daily. 30 tablet 0  . mirabegron ER (MYRBETRIQ) 50 MG TB24 tablet Take 1 tablet (50 mg total) by mouth daily. 30 tablet 3  . modafinil (PROVIGIL) 100 MG tablet Take 0.5 tablets (50 mg total) by mouth daily. 30 tablet 5  . Multiple Vitamin (MULTIVITAMIN WITH MINERALS) TABS tablet Take 1 tablet by mouth daily.    [name]  pantoprazole (PROTONIX) 40 MG tablet Take 1 tablet (40 mg total) by mouth daily. 30 tablet 0  . rosuvastatin (CRESTOR) 40 MG tablet Take 1 tablet (40 mg total) by mouth daily. 90 tablet 2  . sertraline (ZOLOFT) 100 MG tablet Take 2 tablets (200 mg total) by mouth daily. 30 tablet 0  . tamsulosin (FLOMAX) 0.4 MG CAPS capsule Take 1 capsule (0.4 mg total) by mouth at bedtime. 30 capsule 1  . Teriparatide, Recombinant, 620 MCG/2.48ML SOPN inject 20 mcg Subcutaneous Once a day 1 mL 11  . torsemide (DEMADEX) 20 MG tablet TAKE 2 TABLETS IN THE MORNING AND 1 TABLET IN THE AFTERNOON 270 tablet 3   No current facility-administered medications for this visit.    PHYSICAL  EXAMINATION: ECOG PERFORMANCE STATUS: 2 - Symptomatic, <50% confined to bed  Vitals:   05/10/21 0934  BP: 115/65  Pulse: (!) 122  Resp: 18  Temp: 99.6 F (37.6 C)  SpO2: 93%   Filed Weights   05/10/21 0934  Weight: 204 lb 3.2 oz (92.6 kg)    GENERAL:alert, no distress and comfortable SKIN: Noted significant skin bruises NEURO: alert & oriented x 3 with fluent speech, no focal motor/sensory deficits.  Noted proximal myopathy and difficulties with gait instability.  He walks with a cane LABORATORY DATA:  I have reviewed the data as listed    Component Value Date/Time   NA 142 05/03/2021 0948   NA 142 02/01/2021 1410   NA 141 02/23/2017 1237   K 4.0 05/03/2021 0948   K 3.8 02/23/2017 1237   CL 106 05/03/2021 0948   CO2 28 05/03/2021 0948   CO2 27 02/23/2017 1237   GLUCOSE 111 (H) 05/03/2021 0948   GLUCOSE 113 02/23/2017 1237   BUN 16 05/03/2021 0948   BUN 25 02/01/2021 1410   BUN 18.3 02/23/2017 1237   CREATININE 1.11 05/03/2021 0948   CREATININE 1.2 02/23/2017 1237   CALCIUM 8.9 05/03/2021 0948   CALCIUM 9.6 02/23/2017 1237   PROT 7.1 05/03/2021 0948   PROT 7.3 06/18/2018 1205   PROT 8.0 02/23/2017 1237   ALBUMIN 3.6 05/03/2021 0948   ALBUMIN 4.1 09/24/2017 0824   ALBUMIN 4.1 02/23/2017 1237   AST 36 05/03/2021 0948   AST 24 02/23/2017 1237   ALT 27 05/03/2021 0948   ALT 15 02/23/2017 1237   ALKPHOS 92 05/03/2021 0948   ALKPHOS 105 02/23/2017 1237   BILITOT 0.5 05/03/2021 0948   BILITOT 0.6 09/24/2017 0824   BILITOT 0.98 02/23/2017 1237   GFRNONAA >60 05/03/2021 0948   GFRAA 72 02/01/2021 1410    No results found for: SPEP, UPEP  Lab Results  Component Value Date   WBC 4.6 05/03/2021   NEUTROABS 3.7 05/03/2021   HGB 11.4 (L) 05/03/2021   HCT 33.9 (L) 05/03/2021   MCV 92.4 05/03/2021   PLT 87 (L) 05/03/2021      Chemistry      Component Value Date/Time   NA 142 05/03/2021 0948   NA 142 02/01/2021 1410   NA 141 02/23/2017 1237   K 4.0  05/03/2021 0948   K 3.8 02/23/2017 1237   CL 106 05/03/2021 0948   CO2 28 05/03/2021 0948   CO2 27 02/23/2017 1237   BUN 16 05/03/2021 0948   BUN 25 02/01/2021 1410   BUN 18.3 02/23/2017 1237   CREATININE 1.11 05/03/2021 0948   CREATININE 1.2 02/23/2017 1237      Component Value Date/Time   CALCIUM 8.9 05/03/2021 0948  CALCIUM 9.6 02/23/2017 1237   ALKPHOS 92 05/03/2021 0948   ALKPHOS 105 02/23/2017 1237   AST 36 05/03/2021 0948   AST 24 02/23/2017 1237   ALT 27 05/03/2021 0948   ALT 15 02/23/2017 1237   BILITOT 0.5 05/03/2021 0948   BILITOT 0.6 09/24/2017 0824   BILITOT 0.98 02/23/2017 1237

## 2021-05-10 NOTE — Assessment & Plan Note (Signed)
His recent myeloma panel was just borderline abnormal Overall, his studies are stable He will continue [name] blood work in 1 year

## 2021-05-10 NOTE — Assessment & Plan Note (Signed)
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 Even though the CT imaging report from September 2017 did not disclose this, I reviewed the imaging study with the patient extensively which clearly showed mild liver enlargement and splenomegaly. Rarely, autoimmune disorder that could cause pulmonary fibrosis can also cause mild thrombocytopenia. He is not symptomatic.  Previous autoimmune screen, hepatitis C screening and serum vitamin B12 were within normal limits Observe only for now His platelet count is stable

## 2021-05-10 NOTE — Telephone Encounter (Signed)
Scheduled appts per 5/24 sch msg. Pt aware.  

## 2021-05-31 ENCOUNTER — Ambulatory Visit (INDEPENDENT_AMBULATORY_CARE_PROVIDER_SITE_OTHER): Payer: Medicare Other | Admitting: Podiatry

## 2021-05-31 ENCOUNTER — Other Ambulatory Visit: Payer: Self-pay

## 2021-05-31 ENCOUNTER — Encounter: Payer: Self-pay | Admitting: Podiatry

## 2021-05-31 DIAGNOSIS — N1831 Chronic kidney disease, stage 3a: Secondary | ICD-10-CM | POA: Insufficient documentation

## 2021-05-31 DIAGNOSIS — E7800 Pure hypercholesterolemia, unspecified: Secondary | ICD-10-CM | POA: Insufficient documentation

## 2021-05-31 DIAGNOSIS — U071 COVID-19: Secondary | ICD-10-CM | POA: Insufficient documentation

## 2021-05-31 DIAGNOSIS — Z9989 Dependence on other enabling machines and devices: Secondary | ICD-10-CM | POA: Insufficient documentation

## 2021-05-31 DIAGNOSIS — K219 Gastro-esophageal reflux disease without esophagitis: Secondary | ICD-10-CM | POA: Insufficient documentation

## 2021-05-31 DIAGNOSIS — R269 Unspecified abnormalities of gait and mobility: Secondary | ICD-10-CM | POA: Insufficient documentation

## 2021-05-31 DIAGNOSIS — I6529 Occlusion and stenosis of unspecified carotid artery: Secondary | ICD-10-CM | POA: Insufficient documentation

## 2021-05-31 DIAGNOSIS — M79675 Pain in left toe(s): Secondary | ICD-10-CM | POA: Diagnosis not present

## 2021-05-31 DIAGNOSIS — Z86010 Personal history of colonic polyps: Secondary | ICD-10-CM | POA: Insufficient documentation

## 2021-05-31 DIAGNOSIS — R059 Cough, unspecified: Secondary | ICD-10-CM | POA: Insufficient documentation

## 2021-05-31 DIAGNOSIS — Z8673 Personal history of transient ischemic attack (TIA), and cerebral infarction without residual deficits: Secondary | ICD-10-CM | POA: Insufficient documentation

## 2021-05-31 DIAGNOSIS — F339 Major depressive disorder, recurrent, unspecified: Secondary | ICD-10-CM | POA: Insufficient documentation

## 2021-05-31 DIAGNOSIS — K7460 Unspecified cirrhosis of liver: Secondary | ICD-10-CM | POA: Insufficient documentation

## 2021-05-31 DIAGNOSIS — E11621 Type 2 diabetes mellitus with foot ulcer: Secondary | ICD-10-CM | POA: Insufficient documentation

## 2021-05-31 DIAGNOSIS — Z89429 Acquired absence of other toe(s), unspecified side: Secondary | ICD-10-CM | POA: Insufficient documentation

## 2021-05-31 DIAGNOSIS — M810 Age-related osteoporosis without current pathological fracture: Secondary | ICD-10-CM | POA: Insufficient documentation

## 2021-05-31 DIAGNOSIS — E1121 Type 2 diabetes mellitus with diabetic nephropathy: Secondary | ICD-10-CM | POA: Insufficient documentation

## 2021-05-31 DIAGNOSIS — B351 Tinea unguium: Secondary | ICD-10-CM

## 2021-05-31 DIAGNOSIS — E1149 Type 2 diabetes mellitus with other diabetic neurological complication: Secondary | ICD-10-CM

## 2021-05-31 DIAGNOSIS — R350 Frequency of micturition: Secondary | ICD-10-CM | POA: Insufficient documentation

## 2021-05-31 DIAGNOSIS — M109 Gout, unspecified: Secondary | ICD-10-CM | POA: Insufficient documentation

## 2021-05-31 DIAGNOSIS — Z9889 Other specified postprocedural states: Secondary | ICD-10-CM | POA: Insufficient documentation

## 2021-05-31 DIAGNOSIS — Z8616 Personal history of COVID-19: Secondary | ICD-10-CM | POA: Insufficient documentation

## 2021-05-31 DIAGNOSIS — M79674 Pain in right toe(s): Secondary | ICD-10-CM | POA: Diagnosis not present

## 2021-05-31 DIAGNOSIS — Z8679 Personal history of other diseases of the circulatory system: Secondary | ICD-10-CM | POA: Insufficient documentation

## 2021-05-31 DIAGNOSIS — M1990 Unspecified osteoarthritis, unspecified site: Secondary | ICD-10-CM | POA: Insufficient documentation

## 2021-05-31 DIAGNOSIS — Z974 Presence of external hearing-aid: Secondary | ICD-10-CM | POA: Insufficient documentation

## 2021-05-31 DIAGNOSIS — J45909 Unspecified asthma, uncomplicated: Secondary | ICD-10-CM | POA: Insufficient documentation

## 2021-05-31 DIAGNOSIS — R609 Edema, unspecified: Secondary | ICD-10-CM | POA: Insufficient documentation

## 2021-05-31 DIAGNOSIS — R292 Abnormal reflex: Secondary | ICD-10-CM | POA: Insufficient documentation

## 2021-05-31 DIAGNOSIS — L97511 Non-pressure chronic ulcer of other part of right foot limited to breakdown of skin: Secondary | ICD-10-CM | POA: Insufficient documentation

## 2021-05-31 DIAGNOSIS — R209 Unspecified disturbances of skin sensation: Secondary | ICD-10-CM | POA: Insufficient documentation

## 2021-05-31 DIAGNOSIS — H9190 Unspecified hearing loss, unspecified ear: Secondary | ICD-10-CM | POA: Insufficient documentation

## 2021-05-31 DIAGNOSIS — M869 Osteomyelitis, unspecified: Secondary | ICD-10-CM | POA: Insufficient documentation

## 2021-05-31 DIAGNOSIS — D692 Other nonthrombocytopenic purpura: Secondary | ICD-10-CM | POA: Insufficient documentation

## 2021-06-04 NOTE — Progress Notes (Signed)
Subjective: [name]. is a 80 y.o. is seen today for foot evaluation.  States that he has been doing well he has not seen any open sores.  No increase in swelling to his foot but he has noticed.  States he is still following he is being followed up for this.  No recent injuries to his feet.  Denies any fevers, chills, nausea, vomiting.  No calf pain, chest pain, shortness of breath.  No other concerns today.   Objective: General: No acute distress DP/PT pulses palpable 2/4, CRT < 3 sec to all digits.  Sensation decreased with Semmes Weinstein monofilament Incision site on the right side from the previous amputation is well-healed.  No ulcerations are noted today.  The nails appear to be hypertrophic, dystrophic to the left 1, 2, 3, 5 in the right 1, 3, 5.  No edema, erythema or signs of infection of the toenail sites noted today.. No other open lesions or pre-ulcerative lesions.  No pain with calf compression, swelling, warmth, erythema.   Assessment and Plan:  Daily foot exam, symptomatic onychomycosis  -Treatment options discussed including all alternatives, risks, and complications -Discussed the importance of daily foot inspection proper shoe gear. -Sharply debrided nails x7 without any complications or bleeding  Return in about 3 months   Trula Slade DPM

## 2021-06-09 ENCOUNTER — Telehealth: Payer: Self-pay | Admitting: Emergency Medicine

## 2021-06-09 NOTE — Telephone Encounter (Signed)
Merlin Alert for HVR rate 180  BPM lasting over 2 minutes. Called patient to assess for symptoms. Patient states no symptoms today. He also states that he was doing some work on 0622/22 at the time this HVR occurred. Patient does state that he has had some shortness of breath with activity that he has noticed lately. Patient denied any chest pain. I advised that I would send to Dr. Lovena Le just to make aware.

## 2021-06-09 NOTE — Telephone Encounter (Signed)
error 

## 2021-06-11 ENCOUNTER — Other Ambulatory Visit: Payer: Self-pay

## 2021-06-11 ENCOUNTER — Encounter (HOSPITAL_BASED_OUTPATIENT_CLINIC_OR_DEPARTMENT_OTHER): Payer: Self-pay

## 2021-06-11 DIAGNOSIS — I509 Heart failure, unspecified: Secondary | ICD-10-CM | POA: Diagnosis not present

## 2021-06-11 DIAGNOSIS — S51812A Laceration without foreign body of left forearm, initial encounter: Secondary | ICD-10-CM | POA: Insufficient documentation

## 2021-06-11 DIAGNOSIS — Z950 Presence of cardiac pacemaker: Secondary | ICD-10-CM | POA: Diagnosis not present

## 2021-06-11 DIAGNOSIS — W010XXA Fall on same level from slipping, tripping and stumbling without subsequent striking against object, initial encounter: Secondary | ICD-10-CM | POA: Diagnosis not present

## 2021-06-11 DIAGNOSIS — S6491XA Injury of unspecified nerve at wrist and hand level of right arm, initial encounter: Secondary | ICD-10-CM | POA: Diagnosis not present

## 2021-06-11 DIAGNOSIS — S61411A Laceration without foreign body of right hand, initial encounter: Secondary | ICD-10-CM | POA: Diagnosis not present

## 2021-06-11 DIAGNOSIS — E1169 Type 2 diabetes mellitus with other specified complication: Secondary | ICD-10-CM | POA: Diagnosis not present

## 2021-06-11 DIAGNOSIS — Y9289 Other specified places as the place of occurrence of the external cause: Secondary | ICD-10-CM | POA: Insufficient documentation

## 2021-06-11 DIAGNOSIS — I2510 Atherosclerotic heart disease of native coronary artery without angina pectoris: Secondary | ICD-10-CM | POA: Diagnosis not present

## 2021-06-11 DIAGNOSIS — Y9301 Activity, walking, marching and hiking: Secondary | ICD-10-CM | POA: Diagnosis not present

## 2021-06-11 DIAGNOSIS — Z951 Presence of aortocoronary bypass graft: Secondary | ICD-10-CM | POA: Diagnosis not present

## 2021-06-11 DIAGNOSIS — S60921A Unspecified superficial injury of right hand, initial encounter: Secondary | ICD-10-CM | POA: Diagnosis present

## 2021-06-11 DIAGNOSIS — Z87891 Personal history of nicotine dependence: Secondary | ICD-10-CM | POA: Insufficient documentation

## 2021-06-11 DIAGNOSIS — Z79899 Other long term (current) drug therapy: Secondary | ICD-10-CM | POA: Insufficient documentation

## 2021-06-11 DIAGNOSIS — I110 Hypertensive heart disease with heart failure: Secondary | ICD-10-CM | POA: Diagnosis not present

## 2021-06-11 DIAGNOSIS — S0081XA Abrasion of other part of head, initial encounter: Secondary | ICD-10-CM | POA: Diagnosis not present

## 2021-06-11 DIAGNOSIS — S63656A Sprain of metacarpophalangeal joint of right little finger, initial encounter: Secondary | ICD-10-CM | POA: Diagnosis not present

## 2021-06-11 NOTE — ED Triage Notes (Addendum)
Pt was walking inside to his home when he tripped over some cement blocks and fell into the bush. Pt c/o laceration to the right palm and left forearm/elbow skin tears. Denies any other pain. Uses a cane to ambulate. Not on blood thinners and did not hit his head.

## 2021-06-12 ENCOUNTER — Emergency Department (HOSPITAL_BASED_OUTPATIENT_CLINIC_OR_DEPARTMENT_OTHER)
Admission: EM | Admit: 2021-06-12 | Discharge: 2021-06-12 | Disposition: A | Discharge status: Home / Self Care | Payer: Medicare Other | Attending: Emergency Medicine | Admitting: Emergency Medicine

## 2021-06-12 ENCOUNTER — Emergency Department (HOSPITAL_BASED_OUTPATIENT_CLINIC_OR_DEPARTMENT_OTHER): Payer: Medicare Other

## 2021-06-12 DIAGNOSIS — S0081XA Abrasion of other part of head, initial encounter: Secondary | ICD-10-CM

## 2021-06-12 DIAGNOSIS — S6490XA Injury of unspecified nerve at wrist and hand level of unspecified arm, initial encounter: Secondary | ICD-10-CM

## 2021-06-12 DIAGNOSIS — S51812A Laceration without foreign body of left forearm, initial encounter: Secondary | ICD-10-CM

## 2021-06-12 DIAGNOSIS — S61411A Laceration without foreign body of right hand, initial encounter: Secondary | ICD-10-CM

## 2021-06-12 DIAGNOSIS — W010XXS Fall on same level from slipping, tripping and stumbling without subsequent striking against object, sequela: Secondary | ICD-10-CM

## 2021-06-12 DIAGNOSIS — S63656A Sprain of metacarpophalangeal joint of right little finger, initial encounter: Secondary | ICD-10-CM

## 2021-06-12 MED ORDER — LIDOCAINE-EPINEPHRINE 2 %-1:100000 IJ SOLN: 20.0000 mL | Freq: Once | INTRAMUSCULAR | Status: DC

## 2021-06-12 MED ORDER — LIDOCAINE-EPINEPHRINE (PF) 2 %-1:200000 IJ SOLN
INTRAMUSCULAR | Status: AC
  Administered 2021-06-12: 20 mL
  Filled 2021-06-12: qty 20

## 2021-06-12 NOTE — ED Notes (Signed)
Pt verbalizes understanding of discharge instructions. Opportunity for questioning and answers were provided. Armand removed by staff, pt discharged from ED to home. Educated on wound care.

## 2021-06-12 NOTE — ED Provider Notes (Signed)
[facility] Provider Note: Jamie Spurling, MD, FACEP  CSN: [id number] MRN: [id number] ARRIVAL: 06/11/21 at 2145 ROOM: DB007/DB007   CHIEF COMPLAINT  Fall   HISTORY OF PRESENT ILLNESS  06/12/21 2:00 AM Jamie Aleem Elza. is a 80 y.o. male who was walking to his house and tripped over some cement blocks causing him to fall into a bush.  This occurred about 8:30 PM.  He has pain in his right hand, notably on the ulnar side, which he rates as a 9 out of 10.  He characterizes it as aching and it is worse with movement of his little finger.  He states his fourth and fifth fingers also feel somewhat numb but not totally insensate.  He also has superficial abrasion to his left cheek and some skin tears to his left forearm.   Past Medical History:  Diagnosis Date   (HFpEF) heart failure with preserved ejection fraction ([place])    a. 05/2013 Echo: EF 55%, mild LVH, diast dysfxn, Ao sclerosis, mildly dil LA, RV dysfxn (poorly visualized), PASP 50mmHg;  b. 03/2017 Echo: EF 55-60%, no rwma, triv MR, mildly dil RV, mod TR, PASP 74mmHg.   Atrial fibrillation ([place])    s/p Cox Maze 1/09;  Multaq Rx d/c'd in 2014 due to pulmo fibrosis;  coumadin d/c'd in 2014 due to spontaneous subdural hematoma   BPH (benign prostatic hyperplasia)    CAD (coronary artery disease), native coronary artery    a. s/p CABG 12/2007;  b. Myoview 12/2011: EF 66%, no scar or ischemia; normal.   DM (diabetes mellitus) ([place])    Hyperlipidemia type II    Hypertension    [name] (monoclonal gammopathy of unknown significance) 07/31/2018   IgA   OSA (obstructive sleep apnea)    Pacemaker    PPM - St. Jude   Peripheral neuropathy 07/31/2018   Pulmonary fibrosis ([place])    Multaq d/c'd 7/14   Subdural hematoma ([place]) 07/2012   spontaneous;  coumadin d/c'd => no longer a candidate for anticoagulation    Past Surgical History:  Procedure Laterality Date   AMPUTATION Left 05/17/2019   Procedure: AMPUTATION LEFT FOURTH TOE;   Surgeon: Meredith Pel, MD;  Location: [place];  Service: Orthopedics;  Laterality: Left;   AMPUTATION TOE Right 02/06/2020   Procedure: AMPUTATION TOE;  Surgeon: Newt Minion, MD;  Location: [place];  Service: Orthopedics;  Laterality: Right;   AMPUTATION TOE Right 08/17/2020   Procedure: AMPUTATION TOE 4th toe;  Surgeon: Trula Slade, DPM;  [facility];  Service: Podiatry;  Laterality: Right;   APPENDECTOMY     CHOLECYSTECTOMY     CORONARY ARTERY BYPASS GRAFT     x3 (left internal mammary artery to distal left anterior descending coronary artery, saphenous vain graft to second circumflex marginal branch, saphenous vain graft to posterior descending coronary artery, endoscopic saphenous vain harvest from right thigh) and modified Cox - Maze IV procedure.  [name],MD. Electronically signed CHO/MEDQ D: 01/09/2008/ JOB: [id number] cc:  [name]   CRANIOTOMY  07/30/2012   Procedure: CRANIOTOMY HEMATOMA EVACUATION SUBDURAL;  Surgeon: [name], MD;  Location: [place] [facility];  Service: Neurosurgery;  Laterality: Right;  Right craniotomy for evacuation of subdural hematoma   FOOT SURGERY     HERNIA REPAIR     INTRAMEDULLARY (IM) NAIL INTERTROCHANTERIC Right 02/04/2020   Procedure: INTRAMEDULLARY (IM) NAIL INTERTROCHANTRIC;  Surgeon: Netta Cedars, MD;  Location: [place];  Service: Orthopedics;  Laterality:  Right;   ORCHIECTOMY     Left  /  testicular cancer   PACEMAKER PLACEMENT     PPM - St. Jude   PPM GENERATOR CHANGEOUT N/A 06/25/2019   Procedure: PPM GENERATOR CHANGEOUT;  Surgeon: Evans Lance, MD;  Location: [place] [facility];  Service: Cardiovascular;  Laterality: N/A;    Family History  Problem Relation Age of Onset   Heart disease Father    Heart attack Father    Heart failure Father    Heart disease Mother    Alzheimer's disease Mother    Dementia Mother     Social History   Tobacco Use   Smoking status: Former    Packs/day: 2.00    Years: 20.00     Pack years: 40.00    Types: Cigarettes    Quit date: 02/21/1991    Years since quitting: 30.3   Smokeless tobacco: Never  Vaping Use   Vaping Use: Never used  Substance Use Topics   Alcohol use: No    Alcohol/week: 0.0 standard drinks   Drug use: No    Prior to Admission medications   Medication Sig Start Date End Date Taking? Authorizing Provider  acetaminophen (TYLENOL) 325 MG tablet Take 1-2 tablets (325-650 mg total) by mouth every 4 (four) hours as needed for mild pain. 02/24/20   Angiulli, Lavon Paganini, PA-C  acetaminophen (TYLENOL) 325 MG tablet 1 tablet as needed    [provider]  allopurinol (ZYLOPRIM) 300 MG tablet Take 1 tablet (300 mg total) by mouth daily. 02/24/20   Angiulli, Lavon Paganini, PA-C  colchicine 0.6 MG tablet TAKE 1 TO 3 TABLETS AS NEEDED DAILY    [provider]  ferrous sulfate 325 (65 FE) MG tablet Take 1 tablet (325 mg total) by mouth daily with breakfast. 02/24/20   Angiulli, Lavon Paganini, PA-C  memantine (NAMENDA) 10 MG tablet Take 10 mg by mouth 2 (two) times daily. 08/29/20   [provider]  metoprolol succinate (TOPROL-XL) 25 MG 24 hr tablet Take 0.5 tablets (12.5 mg total) by mouth daily. 02/24/20   Angiulli, Lavon Paganini, PA-C  mirabegron ER (MYRBETRIQ) 50 MG TB24 tablet Take 1 tablet (50 mg total) by mouth daily. 02/24/20   Angiulli, Lavon Paganini, PA-C  modafinil (PROVIGIL) 100 MG tablet Take 0.5 tablets (50 mg total) by mouth daily. 03/08/21   Ward Givens, NP  Multiple Vitamin (MULTI VITAMIN) TABS 1 tablet    [provider]  Multiple Vitamin (MULTIVITAMIN WITH MINERALS) TABS tablet Take 1 tablet by mouth daily. 02/10/20   Guilford Shi, MD  pantoprazole (PROTONIX) 40 MG tablet Take 1 tablet (40 mg total) by mouth daily. 02/24/20   Angiulli, Lavon Paganini, PA-C  rosuvastatin (CRESTOR) 40 MG tablet Take 1 tablet (40 mg total) by mouth daily. 02/24/20   Angiulli, Lavon Paganini, PA-C  sertraline (ZOLOFT) 100 MG tablet Take 2 tablets (200 mg total) by mouth  daily. 02/24/20   Angiulli, Lavon Paganini, PA-C  tamsulosin (FLOMAX) 0.4 MG CAPS capsule Take 1 capsule (0.4 mg total) by mouth at bedtime. 02/24/20   Angiulli, Lavon Paganini, PA-C  Teriparatide, Recombinant, 620 MCG/2.48ML SOPN inject 20 mcg Subcutaneous Once a day 04/11/21     torsemide (DEMADEX) 20 MG tablet TAKE 2 TABLETS IN THE MORNING AND 1 TABLET IN THE AFTERNOON 09/13/20   [name], MD    Allergies Aricept [donepezil], Baclofen, Lipitor [atorvastatin], Nsaids, Warfarin and related, and Enbrel [etanercept]   REVIEW OF SYSTEMS  Negative except  as noted here or in the History of Present Illness.   PHYSICAL EXAMINATION  Initial Vital Signs Blood pressure (!) 102/52, pulse 72, temperature 98.2 F (36.8 C), temperature source Oral, resp. rate 16, height 5\' 8"  (1.727 m), weight 90.7 kg, SpO2 100 %.  Examination General: Well-developed, well-nourished male in no acute distress; appearance consistent with age of record HENT: normocephalic; superficial abrasion to left cheek Eyes: pupils equal, round and reactive to light; extraocular muscles intact Neck: supple; nontender Heart: regular rate and rhythm Lungs: clear to auscultation bilaterally Abdomen: soft; nondistended; nontender; bowel sounds present Extremities: Surgical absence of 3 toes, 2 on right foot 1 on left foot; laceration palm of right hand; tenderness of right hand notably over the fifth MCP joint with partially decreased sensation of the fourth and fifth fingers, tendon function intact Neurologic: Awake, alert and oriented; motor function intact in all extremities and symmetric; no facial droop Skin: Warm and dry; multiple superficial abrasions and skin tears of the right forearm Psychiatric: Normal mood and affect   RESULTS  Summary of this visit's results, reviewed and interpreted by myself:   EKG Interpretation  Date/Time:    Ventricular Rate:    PR Interval:    QRS Duration:   QT Interval:    QTC Calculation:    R Axis:     Text Interpretation:          Laboratory Studies: No results found for this or any previous visit (from the past 24 hour(s)). Imaging Studies: DG Hand Complete Right  Result Date: 06/12/2021 CLINICAL DATA:  Fall with hand laceration EXAM: RIGHT HAND - COMPLETE 3+ VIEW COMPARISON:  None. FINDINGS: There is no evidence of fracture or dislocation. No opaque foreign body. 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 IMPRESSION: Negative for fracture or opaque foreign body. Electronically Signed   By: Monte Fantasia M.D.   On: 06/12/2021 04:15    ED COURSE and MDM  Nursing notes, initial and subsequent vitals signs, including pulse oximetry, reviewed and interpreted by myself.  Vitals:   06/11/21 2203 06/11/21 2205 06/12/21 0108 06/12/21 0300  BP: 105/66  (!) 102/52 (!) 115/51  Pulse: 60  72 70  Resp: 17  16 16   Temp: 98.2 F (36.8 C)     TempSrc: Oral     SpO2: 98%  100% 98%  Weight:  90.7 kg    Height:  5\' 8"  (1.727 m)     Medications  lidocaine-EPINEPHrine (XYLOCAINE W/EPI) 2 %-1:200000 (PF) injection (20 mLs  Given 06/12/21 0329)   Patient advised to have sutures removed in about 10 days.  The wound did not appear deep enough to warrant antibiotic prophylaxis.  Skin tears dressed by nursing staff.   PROCEDURES  Procedures LACERATION REPAIR Performed by: Karen Chafe Albeiro Trompeter Authorized by: Karen Chafe Anastasya Jewell Consent: Verbal consent obtained. Risks and benefits: risks, benefits and alternatives were discussed Consent given by: patient Patient identity confirmed: provided demographic data Prepped and Draped in normal sterile fashion Wound explored  Laceration Location: Palm of right hand  Laceration Length: 1 cm  No Foreign Bodies seen or palpated  Anesthesia: local infiltration  Local anesthetic: lidocaine 2% with epinephrine  Anesthetic total: 2 ml  Irrigation method: syringe Amount of cleaning: standard  Skin closure:  4-0 Prolene  Number of sutures: 2  Technique: Simple interrupted  Patient tolerance: Patient tolerated the procedure well with no immediate complications.   ED DIAGNOSES     ICD-10-CM  1. Fall from slip, trip, or stumble, sequela  W01.0XXS     2. Laceration of right hand without foreign body, initial encounter  S61.411A     3. Injury of nerve at level of hand  S64.90XA     4. Sprain of metacarpophalangeal (MCP) joint of right little finger, initial encounter  S63.656A     5. Skin tear of left forearm without complication, initial encounter  S51.812A     6. Abrasion of face, initial encounter  S00.81XA          [name], Jamie Reichmann, MD 06/12/21 [phone]

## 2021-06-12 NOTE — ED Notes (Signed)
Wound care provided and steri strips applied to left arm.

## 2021-06-21 ENCOUNTER — Ambulatory Visit (INDEPENDENT_AMBULATORY_CARE_PROVIDER_SITE_OTHER): Payer: Medicare Other

## 2021-06-21 DIAGNOSIS — I495 Sick sinus syndrome: Secondary | ICD-10-CM | POA: Diagnosis not present

## 2021-06-21 DIAGNOSIS — Z20822 Contact with and (suspected) exposure to covid-19: Secondary | ICD-10-CM | POA: Diagnosis not present

## 2021-06-21 LAB — CUP PACEART REMOTE DEVICE CHECK
Battery Remaining Longevity: 99 mo
Battery Remaining Percentage: 84 %
Battery Voltage: 3.01 V
Brady Statistic AP VP Percent: 2.1 %
Brady Statistic AP VS Percent: 6.7 %
Brady Statistic AS VP Percent: 1 %
Brady Statistic AS VS Percent: 88 %
Brady Statistic RA Percent Paced: 7.2 %
Brady Statistic RV Percent Paced: 2.6 %
Date Time Interrogation Session: 20220705065200
Implantable Lead Implant Date: 20090420
Implantable Lead Implant Date: 20090420
Implantable Lead Location: 753859
Implantable Lead Location: 753860
Implantable Pulse Generator Implant Date: 20200708
Lead Channel Impedance Value: 440 Ohm
Lead Channel Impedance Value: 560 Ohm
Lead Channel Pacing Threshold Amplitude: 0.75 V
Lead Channel Pacing Threshold Amplitude: 1 V
Lead Channel Pacing Threshold Pulse Width: 0.4 ms
Lead Channel Pacing Threshold Pulse Width: 0.4 ms
Lead Channel Sensing Intrinsic Amplitude: 1.5 mV
Lead Channel Sensing Intrinsic Amplitude: 6.4 mV
Lead Channel Setting Pacing Amplitude: 2 V
Lead Channel Setting Pacing Amplitude: 2.5 V
Lead Channel Setting Pacing Pulse Width: 0.4 ms
Lead Channel Setting Sensing Sensitivity: 2 mV
Pulse Gen Model: 2272
Pulse Gen Serial Number: 9149180

## 2021-07-08 NOTE — Progress Notes (Signed)
Remote pacemaker transmission.   

## 2021-07-18 DIAGNOSIS — E119 Type 2 diabetes mellitus without complications: Secondary | ICD-10-CM | POA: Diagnosis not present

## 2021-07-18 DIAGNOSIS — E7801 Familial hypercholesterolemia: Secondary | ICD-10-CM | POA: Diagnosis not present

## 2021-07-18 DIAGNOSIS — Z125 Encounter for screening for malignant neoplasm of prostate: Secondary | ICD-10-CM | POA: Diagnosis not present

## 2021-07-21 DIAGNOSIS — D692 Other nonthrombocytopenic purpura: Secondary | ICD-10-CM | POA: Diagnosis not present

## 2021-07-21 DIAGNOSIS — I2510 Atherosclerotic heart disease of native coronary artery without angina pectoris: Secondary | ICD-10-CM | POA: Diagnosis not present

## 2021-07-21 DIAGNOSIS — G3183 Dementia with Lewy bodies: Secondary | ICD-10-CM | POA: Diagnosis not present

## 2021-07-21 DIAGNOSIS — M109 Gout, unspecified: Secondary | ICD-10-CM | POA: Diagnosis not present

## 2021-07-21 DIAGNOSIS — D696 Thrombocytopenia, unspecified: Secondary | ICD-10-CM | POA: Diagnosis not present

## 2021-07-21 DIAGNOSIS — I10 Essential (primary) hypertension: Secondary | ICD-10-CM | POA: Diagnosis not present

## 2021-07-21 DIAGNOSIS — R911 Solitary pulmonary nodule: Secondary | ICD-10-CM | POA: Diagnosis not present

## 2021-07-21 DIAGNOSIS — R161 Splenomegaly, not elsewhere classified: Secondary | ICD-10-CM | POA: Diagnosis not present

## 2021-07-21 DIAGNOSIS — Z0001 Encounter for general adult medical examination with abnormal findings: Secondary | ICD-10-CM | POA: Diagnosis not present

## 2021-07-21 DIAGNOSIS — E1121 Type 2 diabetes mellitus with diabetic nephropathy: Secondary | ICD-10-CM | POA: Diagnosis not present

## 2021-07-21 DIAGNOSIS — I6529 Occlusion and stenosis of unspecified carotid artery: Secondary | ICD-10-CM | POA: Diagnosis not present

## 2021-07-21 DIAGNOSIS — I5042 Chronic combined systolic (congestive) and diastolic (congestive) heart failure: Secondary | ICD-10-CM | POA: Diagnosis not present

## 2021-07-26 ENCOUNTER — Other Ambulatory Visit: Payer: Self-pay | Admitting: Internal Medicine

## 2021-07-26 DIAGNOSIS — R918 Other nonspecific abnormal finding of lung field: Secondary | ICD-10-CM

## 2021-08-03 ENCOUNTER — Ambulatory Visit
Admission: RE | Admit: 2021-08-03 | Discharge: 2021-08-03 | Disposition: A | Discharge status: Home / Self Care | Payer: Medicare Other | Source: Ambulatory Visit | Attending: Internal Medicine | Admitting: Internal Medicine

## 2021-08-03 DIAGNOSIS — R918 Other nonspecific abnormal finding of lung field: Secondary | ICD-10-CM | POA: Diagnosis not present

## 2021-08-03 DIAGNOSIS — R911 Solitary pulmonary nodule: Secondary | ICD-10-CM | POA: Diagnosis not present

## 2021-08-03 DIAGNOSIS — I700 Atherosclerosis of aorta: Secondary | ICD-10-CM | POA: Diagnosis not present

## 2021-08-16 ENCOUNTER — Other Ambulatory Visit: Payer: Self-pay

## 2021-08-16 ENCOUNTER — Ambulatory Visit (INDEPENDENT_AMBULATORY_CARE_PROVIDER_SITE_OTHER): Payer: Medicare Other | Admitting: Internal Medicine

## 2021-08-16 VITALS — BP 110/64 | HR 115 | Ht 68.0 in | Wt 200.8 lb

## 2021-08-16 DIAGNOSIS — I495 Sick sinus syndrome: Secondary | ICD-10-CM

## 2021-08-16 DIAGNOSIS — I2510 Atherosclerotic heart disease of native coronary artery without angina pectoris: Secondary | ICD-10-CM | POA: Diagnosis not present

## 2021-08-16 DIAGNOSIS — I480 Paroxysmal atrial fibrillation: Secondary | ICD-10-CM | POA: Diagnosis not present

## 2021-08-16 DIAGNOSIS — I10 Essential (primary) hypertension: Secondary | ICD-10-CM | POA: Diagnosis not present

## 2021-08-16 MED ORDER — METOPROLOL SUCCINATE ER 25 MG PO TB24
12.5000 mg | ORAL_TABLET | Freq: Two times a day (BID) | ORAL | 3 refills | Status: DC
Start: 2021-08-16 — End: 2021-10-26

## 2021-08-16 NOTE — Patient Instructions (Signed)
Medication Instructions:  1) INCREASE Metoprolol Succinate to 12.'5mg'$  twice daily  Labwork: None ordered.  Testing/Procedures: None ordered.  Follow-Up: Your physician wants you to follow-up in: 4 months with  Cristopher Peru, MD or one of the following Advanced Practice Providers on your designated Care Team:    Tommye Standard, [place] Legrand Como "Jonni Sanger" [place], [place]   You will receive a reminder letter in the mail two months in advance. If you don't receive a letter, please call our office to schedule the follow-up appointment.  Remote monitoring is used to monitor your Pacemaker or ICD from home. This monitoring reduces the number of office visits required to check your device to one time per year. It allows [place] to keep an eye on the functioning of your device to ensure it is working properly. You are scheduled for a device check from home on 09/20/21. You may send your transmission at any time that day. If you have a wireless device, the transmission will be sent automatically. After your physician reviews your transmission, you will receive a postcard with your next transmission date.  Any Other Special Instructions Will Be Listed Below (If Applicable).  If you need a refill on your cardiac medications before your next appointment, please call your pharmacy.

## 2021-08-16 NOTE — Progress Notes (Signed)
HPI [name] returns today for followup. He is a pleasant 80 yo man with a h/o sinus node dysfunction, s/p PPM insertion. He has HTN. He feels well. He denies chest pain. He has developed atrial fib with a  RVR. He does not feel palpitations. He has had some mild worsening dyspnea. He has mild peripheral edema.  Allergies  Allergen Reactions   Aricept [Donepezil] Other (See Comments)    Hallucination    Baclofen Itching   Lipitor [Atorvastatin] Other (See Comments)    Stiff joints   Nsaids Other (See Comments)    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   Warfarin And Related Other (See Comments)    Stiff joints   Enbrel [Etanercept] Itching     Current Outpatient Medications  Medication Sig Dispense Refill   acetaminophen (TYLENOL) 325 MG tablet Take 1-2 tablets (325-650 mg total) by mouth every 4 (four) hours as needed for mild pain.     acetaminophen (TYLENOL) 325 MG tablet 1 tablet as needed     allopurinol (ZYLOPRIM) 300 MG tablet Take 1 tablet (300 mg total) by mouth daily. 30 tablet 0   colchicine 0.6 MG tablet TAKE 1 TO 3 TABLETS AS NEEDED DAILY     ferrous sulfate 325 (65 FE) MG tablet Take 1 tablet (325 mg total) by mouth daily with breakfast. 30 tablet 3   memantine (NAMENDA) 10 MG tablet Take 10 mg by mouth 2 (two) times daily.     mirabegron ER (MYRBETRIQ) 50 MG TB24 tablet Take 1 tablet (50 mg total) by mouth daily. 30 tablet 3   modafinil (PROVIGIL) 100 MG tablet Take 0.5 tablets (50 mg total) by mouth daily. 30 tablet 5   Multiple Vitamin (MULTI VITAMIN) TABS 1 tablet     Multiple Vitamin (MULTIVITAMIN WITH MINERALS) TABS tablet Take 1 tablet by mouth daily.     pantoprazole (PROTONIX) 40 MG tablet Take 1 tablet (40 mg total) by mouth daily. 30 tablet 0   rosuvastatin (CRESTOR) 40 MG tablet Take 1 tablet (40 mg total) by mouth daily. 90 tablet 2   sertraline (ZOLOFT) 100 MG tablet Take 2 tablets (200 mg total) by mouth daily. 30 tablet 0   tamsulosin (FLOMAX) 0.4 MG CAPS  capsule Take 1 capsule (0.4 mg total) by mouth at bedtime. 30 capsule 1   Teriparatide, Recombinant, 620 MCG/2.48ML SOPN inject 20 mcg Subcutaneous Once a day 1 mL 11   torsemide (DEMADEX) 20 MG tablet TAKE 2 TABLETS IN THE MORNING AND 1 TABLET IN THE AFTERNOON 270 tablet 3   metoprolol succinate (TOPROL-XL) 25 MG 24 hr tablet Take 0.5 tablets (12.5 mg total) by mouth in the morning and at bedtime. 90 tablet 3   No current facility-administered medications for this visit.     Past Medical History:  Diagnosis Date   (HFpEF) heart failure with preserved ejection fraction ([place])    a. 05/2013 Echo: EF 55%, mild LVH, diast dysfxn, Ao sclerosis, mildly dil LA, RV dysfxn (poorly visualized), PASP 53mHg;  b. 03/2017 Echo: EF 55-60%, no rwma, triv MR, mildly dil RV, mod TR, PASP 481mg.   [facility]   s/p Cox Maze 1/09;  Multaq Rx d/c'd in 2014 due to pulmo fibrosis;  coumadin d/c'd in 2014 due to spontaneous subdural hematoma   BPH (benign prostatic hyperplasia)    CAD (coronary artery disease), native coronary artery    a. s/p CABG 12/2007;  b. Myoview 12/2011: EF 66%, no  scar or ischemia; normal.   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 Jude   Peripheral neuropathy 07/31/2018   Pulmonary fibrosis ([place])    Multaq d/c'd 7/14   Subdural hematoma ([place]) 07/2012   spontaneous;  coumadin d/c'd => no longer a candidate for anticoagulation    ROS:   All systems reviewed and negative except as noted in the HPI.   Past Surgical History:  Procedure Laterality Date   AMPUTATION Left 05/17/2019   Procedure: AMPUTATION LEFT FOURTH TOE;  Surgeon: Meredith Pel, MD;  Location: [place];  Service: Orthopedics;  Laterality: Left;   AMPUTATION TOE Right 02/06/2020   Procedure: AMPUTATION TOE;  Surgeon: Newt Minion, MD;   Location: [place];  Service: Orthopedics;  Laterality: Right;   AMPUTATION TOE Right 08/17/2020   Procedure: AMPUTATION TOE 4th toe;  Surgeon: Trula Slade, DPM;  [facility];  Service: Podiatry;  Laterality: Right;   APPENDECTOMY     CHOLECYSTECTOMY     CORONARY ARTERY BYPASS GRAFT     x3 (left internal mammary artery to distal left anterior descending coronary artery, saphenous vain graft to second circumflex marginal branch, saphenous vain graft to posterior descending coronary artery, endoscopic saphenous vain harvest from right thigh) and modified Cox - Maze IV procedure.  [name],MD. Electronically signed CHO/MEDQ D: 01/09/2008/ JOB: [id number] cc:  [name]   CRANIOTOMY  07/30/2012   Procedure: CRANIOTOMY HEMATOMA EVACUATION SUBDURAL;  Surgeon: [name], MD;  Location: [place] [facility];  Service: Neurosurgery;  Laterality: Right;  Right craniotomy for evacuation of subdural hematoma   FOOT SURGERY     HERNIA REPAIR     INTRAMEDULLARY (IM) NAIL INTERTROCHANTERIC Right 02/04/2020   Procedure: INTRAMEDULLARY (IM) NAIL INTERTROCHANTRIC;  Surgeon: Netta Cedars, MD;  Location: [place];  Service: Orthopedics;  Laterality: Right;   ORCHIECTOMY     Left  /  testicular cancer   PACEMAKER PLACEMENT     PPM - St. Jude   PPM GENERATOR CHANGEOUT N/A 06/25/2019   Procedure: PPM GENERATOR CHANGEOUT;  Surgeon: Evans Lance, MD;  Location: [place] [facility];  Service: Cardiovascular;  Laterality: N/A;     Family History  Problem Relation Age of Onset   Heart disease Father    Heart attack Father    Heart failure Father    Heart disease Mother    Alzheimer's disease Mother    Dementia Mother      Social History   Socioeconomic History   Marital status: Married    Spouse name: Immunologist   Number of children: 2   Years of education: Not on file   Highest education level: Not on file  Occupational History   Occupation: Retired- IT trainer  Tobacco Use   Smoking status:  Former    Packs/day: 2.00    Years: 20.00    Pack years: 40.00    Types: Cigarettes    Quit date: 02/21/1991    Years since quitting: 30.5   Smokeless tobacco: Never  Vaping Use   Vaping Use: Never used  Substance and Sexual Activity   Alcohol use: No    Alcohol/week: 0.0 standard drinks   Drug use: No   Sexual activity: Not Currently  Other Topics Concern  Not on file  Social History Narrative   Lives with wife   Right handed    Married with two children.     He is a Engineer, structural.     Social Determinants of Health   Financial Resource Strain: Not on file  Food Insecurity: Not on file  Transportation Needs: Not on file  Physical Activity: Not on file  Stress: Not on file  Social Connections: Not on file  Intimate Partner Violence: Not on file     BP 110/64   Pulse (!) 115   Ht '5\' 8"'$  (1.727 m)   Wt 200 lb 12.8 oz (91.1 kg)   SpO2 94%   BMI 30.53 kg/m   Physical Exam:  Well appearing NAD HEENT: Unremarkable Neck:  No JVD, no thyromegally Lymphatics:  No adenopathy Back:  No CVA tenderness Lungs:  Clear with no wheezes HEART:  IRegular rate rhythm, no murmurs, no rubs, no clicks Abd:  soft, positive bowel sounds, no organomegally, no rebound, no guarding Ext:  2 plus pulses, no edema, no cyanosis, no clubbing Skin:  No rashes no nodules Neuro:  CN II through XII intact, motor grossly intact   DEVICE  Normal device function.  See PaceArt for details.   Assess/Plan:  Uncontrolled atrial fib - his rates are fast. I asked him to increase his toprol. I will see him back in a couple of months.We might add either an AA drug or digoxin or uptitrate the beta blocker further. HTN - his bp is well controlled today. 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 PPM -his St. Jude DDD PM is working normally. His rates are increased.   Salome Spotted.

## 2021-08-17 DIAGNOSIS — N1831 Chronic kidney disease, stage 3a: Secondary | ICD-10-CM | POA: Diagnosis not present

## 2021-08-17 DIAGNOSIS — M810 Age-related osteoporosis without current pathological fracture: Secondary | ICD-10-CM | POA: Diagnosis not present

## 2021-08-17 DIAGNOSIS — I130 Hypertensive heart and chronic kidney disease with heart failure and stage 1 through stage 4 chronic kidney disease, or unspecified chronic kidney disease: Secondary | ICD-10-CM | POA: Diagnosis not present

## 2021-08-17 DIAGNOSIS — I5042 Chronic combined systolic (congestive) and diastolic (congestive) heart failure: Secondary | ICD-10-CM | POA: Diagnosis not present

## 2021-09-01 ENCOUNTER — Other Ambulatory Visit: Payer: Self-pay

## 2021-09-01 ENCOUNTER — Ambulatory Visit (INDEPENDENT_AMBULATORY_CARE_PROVIDER_SITE_OTHER): Payer: Medicare Other | Admitting: Podiatry

## 2021-09-01 DIAGNOSIS — E1149 Type 2 diabetes mellitus with other diabetic neurological complication: Secondary | ICD-10-CM

## 2021-09-01 DIAGNOSIS — B351 Tinea unguium: Secondary | ICD-10-CM | POA: Diagnosis not present

## 2021-09-01 DIAGNOSIS — M79675 Pain in left toe(s): Secondary | ICD-10-CM | POA: Diagnosis not present

## 2021-09-01 DIAGNOSIS — M79674 Pain in right toe(s): Secondary | ICD-10-CM | POA: Diagnosis not present

## 2021-09-05 NOTE — Progress Notes (Signed)
Subjective: [name]. is a 80 y.o. is seen today for foot exam.  He has not had any open sores.  No swelling redness or any drainage.  Also for the nails be trimmed as a elongated.  No fevers or chills.  No other concerns.  Objective: General: No acute distress DP/PT pulses palpable 2/4, CRT < 3 sec to all digits.  Sensation decreased with Semmes Weinstein monofilament Incision site on the right side from the previous amputation is well-healed.   No ulcerations are noted today.   The nails appear to be hypertrophic, dystrophic to the left 1, 2, 3, 5 in the right 1, 3, 5.  No edema, erythema or signs of infection of the toenail sites noted today No other open lesions or pre-ulcerative lesions.  No pain with calf compression, swelling, warmth, erythema.   Assessment and Plan:  Daily foot exam, symptomatic onychomycosis  -Treatment options discussed including all alternatives, risks, and complications -Discussed the importance of daily foot inspection proper shoe gear. -Sharply debrided nails x7 without any complications or bleeding  Return in about 3 months   Trula Slade DPM

## 2021-09-16 ENCOUNTER — Other Ambulatory Visit: Payer: Self-pay | Admitting: Cardiology

## 2021-09-16 DIAGNOSIS — I50812 Chronic right heart failure: Secondary | ICD-10-CM

## 2021-09-20 ENCOUNTER — Ambulatory Visit (INDEPENDENT_AMBULATORY_CARE_PROVIDER_SITE_OTHER): Payer: Medicare Other

## 2021-09-20 DIAGNOSIS — I495 Sick sinus syndrome: Secondary | ICD-10-CM | POA: Diagnosis not present

## 2021-09-20 LAB — CUP PACEART REMOTE DEVICE CHECK
Battery Remaining Longevity: 97 mo
Battery Remaining Percentage: 82 %
Battery Voltage: 3.01 V
Brady Statistic AP VP Percent: 0 %
Brady Statistic AP VS Percent: 0 %
Brady Statistic AS VP Percent: 0 %
Brady Statistic AS VS Percent: 0 %
Brady Statistic RA Percent Paced: 0 %
Brady Statistic RV Percent Paced: 1 %
Date Time Interrogation Session: 20221003093623
Implantable Lead Implant Date: 20090420
Implantable Lead Implant Date: 20090420
Implantable Lead Location: 753859
Implantable Lead Location: 753860
Implantable Pulse Generator Implant Date: 20200708
Lead Channel Impedance Value: 440 Ohm
Lead Channel Impedance Value: 530 Ohm
Lead Channel Pacing Threshold Amplitude: 0.75 V
Lead Channel Pacing Threshold Amplitude: 0.75 V
Lead Channel Pacing Threshold Pulse Width: 0.4 ms
Lead Channel Pacing Threshold Pulse Width: 0.4 ms
Lead Channel Sensing Intrinsic Amplitude: 0.4 mV
Lead Channel Sensing Intrinsic Amplitude: 6.4 mV
Lead Channel Setting Pacing Amplitude: 2 V
Lead Channel Setting Pacing Amplitude: 2.5 V
Lead Channel Setting Pacing Pulse Width: 0.4 ms
Lead Channel Setting Sensing Sensitivity: 2 mV
Pulse Gen Model: 2272
Pulse Gen Serial Number: 9149180

## 2021-09-28 NOTE — Progress Notes (Signed)
Remote pacemaker transmission.   

## 2021-09-29 DIAGNOSIS — E559 Vitamin D deficiency, unspecified: Secondary | ICD-10-CM | POA: Diagnosis not present

## 2021-09-29 DIAGNOSIS — N1831 Chronic kidney disease, stage 3a: Secondary | ICD-10-CM | POA: Diagnosis not present

## 2021-09-29 DIAGNOSIS — M1000 Idiopathic gout, unspecified site: Secondary | ICD-10-CM | POA: Diagnosis not present

## 2021-09-29 DIAGNOSIS — I129 Hypertensive chronic kidney disease with stage 1 through stage 4 chronic kidney disease, or unspecified chronic kidney disease: Secondary | ICD-10-CM | POA: Diagnosis not present

## 2021-09-29 DIAGNOSIS — N400 Enlarged prostate without lower urinary tract symptoms: Secondary | ICD-10-CM | POA: Diagnosis not present

## 2021-09-29 DIAGNOSIS — N189 Chronic kidney disease, unspecified: Secondary | ICD-10-CM | POA: Diagnosis not present

## 2021-09-29 DIAGNOSIS — D631 Anemia in chronic kidney disease: Secondary | ICD-10-CM | POA: Diagnosis not present

## 2021-09-29 DIAGNOSIS — D472 Monoclonal gammopathy: Secondary | ICD-10-CM | POA: Diagnosis not present

## 2021-10-03 ENCOUNTER — Telehealth: Payer: Self-pay | Admitting: Cardiology

## 2021-10-03 DIAGNOSIS — I50812 Chronic right heart failure: Secondary | ICD-10-CM

## 2021-10-03 MED ORDER — TORSEMIDE 20 MG PO TABS: ORAL_TABLET | ORAL | 3 refills | Status: DC

## 2021-10-03 NOTE — Telephone Encounter (Signed)
Pt c/o swelling: STAT is pt has developed SOB within 24 hours  How much weight have you gained and in what time span? yes  If swelling, where is the swelling located? Swelling in his leg  Are you currently taking a fluid pill? Yes (have increase the fluid pills)  Are you currently SOB? yes  Do you have a log of your daily weights (if so, list)? no  Have you gained 3 pounds in a day or 5 pounds in a week? yes  Have you traveled recently? no

## 2021-10-03 NOTE — Telephone Encounter (Signed)
Spoke with pt wife, Aware of dr Jacalyn Lefevre recommendations.  Follow up scheduled next week and labs will be drawn at that time.

## 2021-10-03 NOTE — Telephone Encounter (Signed)
Spoke with pt's wife. She report pt has developed bilateral leg swelling from his ankle up to knees for the past 3 days. She denies swelling anywhere else and report pt doesn't weigh himself. She report pt has taken an extra dose of torsemide x 2-3 days with no significant change. In fact, she feels swelling has increased and pt is now sob with exertions.   Will forward to MD for recommendations.

## 2021-10-05 NOTE — Progress Notes (Signed)
HPI:FU CAD and atrial fibrillation. He underwent CABG (LIMA-LAD, SVG-OM 2, SVG-PDA) along with modified Cox Maze IV procedure in 12/2007. 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 Abdominal US 3/12: No aneurysm. 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 He underwent craniotomy and evacuation by Dr. Saintclair Halsted. He was taken off of Coumadin and no longer felt to be an anticoagulation candidate. Multaq DCed previously as felt causing pulmonary toxixity. Nuclear study 5/18 showed EF 59 with normal perfusion. Echocardiogram May 2020 showed ejection fraction 50 to 55%, moderate right ventricular enlargement, mildly reduced RV function, severe tricuspid regurgitation. ABIs June 2020 normal. Carotid Dopplers June 2020 showed 1 to 39% bilateral stenosis. Transcranial Dopplers June 2020 negative. Chest CT August 2022 showed pulmonary fibrosis, probable cirrhosis, enlarged spleen and enlarged pulmonary trunk suggestive of pulmonary hypertension.  Patient seen in August 2022 with recurrent atrial fibrillation by Dr. Lovena Le.  Metoprolol was increased.  Since last seen, patient developed worsening bilateral lower extremity edema and some increased dyspnea on exertion.  He also has fatigue.  His Demadex was increased as an outpatient with some improvement but his symptoms persist.  He denies chest pain, palpitations or syncope.  Current Outpatient Medications  Medication Sig Dispense Refill   acetaminophen (TYLENOL) 325 MG tablet Take 1-2 tablets (325-650 mg total) by mouth every 4 (four) hours as needed for mild pain.     acetaminophen (TYLENOL) 325 MG tablet 1 tablet as needed     allopurinol (ZYLOPRIM) 300 MG tablet Take 1 tablet (300 mg total) by mouth daily. 30 tablet 0   azithromycin (ZITHROMAX) 250 MG tablet Take 250 mg by mouth.     colchicine 0.6 MG tablet TAKE 1 TO 3 TABLETS AS NEEDED DAILY     ferrous sulfate 325 (65 FE) MG tablet Take 1  tablet (325 mg total) by mouth daily with breakfast. 30 tablet 3   memantine (NAMENDA) 10 MG tablet Take 10 mg by mouth 2 (two) times daily.     metoprolol succinate (TOPROL-XL) 25 MG 24 hr tablet Take 0.5 tablets (12.5 mg total) by mouth in the morning and at bedtime. 90 tablet 3   mirabegron ER (MYRBETRIQ) 50 MG TB24 tablet Take 1 tablet (50 mg total) by mouth daily. 30 tablet 3   modafinil (PROVIGIL) 100 MG tablet Take 0.5 tablets (50 mg total) by mouth daily. 30 tablet 5   Multiple Vitamin (MULTI VITAMIN) TABS 1 tablet     Multiple Vitamin (MULTIVITAMIN WITH MINERALS) TABS tablet Take 1 tablet by mouth daily.     pantoprazole (PROTONIX) 40 MG tablet Take 1 tablet (40 mg total) by mouth daily. 30 tablet 0   predniSONE (DELTASONE) 20 MG tablet Take 40 mg by mouth daily.     rosuvastatin (CRESTOR) 40 MG tablet Take 1 tablet (40 mg total) by mouth daily. 90 tablet 2   sertraline (ZOLOFT) 100 MG tablet Take 2 tablets (200 mg total) by mouth daily. 30 tablet 0   tamsulosin (FLOMAX) 0.4 MG CAPS capsule Take 1 capsule (0.4 mg total) by mouth at bedtime. 30 capsule 1   Teriparatide, Recombinant, 620 MCG/2.48ML SOPN inject 20 mcg Subcutaneous Once a day 1 mL 11   torsemide (DEMADEX) 20 MG tablet Take 2 tablets twice daily 270 tablet 3   No current facility-administered medications for this visit.     Past Medical History:  Diagnosis Date   (HFpEF) heart failure with preserved ejection fraction ([place])  a. 05/2013 Echo: EF 55%, mild LVH, diast dysfxn, Ao sclerosis, mildly dil LA, RV dysfxn (poorly visualized), PASP 79mmHg;  b. 03/2017 Echo: EF 55-60%, no rwma, triv MR, mildly dil RV, mod TR, PASP 25mmHg.   Atrial fibrillation ([place])    s/p Cox Maze 1/09;  Multaq Rx d/c'd in 2014 due to pulmo fibrosis;  coumadin d/c'd in 2014 due to spontaneous subdural hematoma   BPH (benign prostatic hyperplasia)    CAD (coronary artery disease), native coronary artery    a. s/p CABG 12/2007;  b. Myoview 12/2011: EF  66%, no scar or ischemia; normal.   DM (diabetes mellitus) ([place])    Hyperlipidemia type II    Hypertension    [name] (monoclonal gammopathy of unknown significance) 07/31/2018   IgA   OSA (obstructive sleep apnea)    Pacemaker    PPM - St. Jude   Peripheral neuropathy 07/31/2018   Pulmonary fibrosis ([place])    Multaq d/c'd 7/14   Subdural hematoma 07/2012   spontaneous;  coumadin d/c'd => no longer a candidate for anticoagulation    Past Surgical History:  Procedure Laterality Date   AMPUTATION Left 05/17/2019   Procedure: AMPUTATION LEFT FOURTH TOE;  Surgeon: Meredith Pel, MD;  Location: [facility];  Service: Orthopedics;  Laterality: Left;   AMPUTATION TOE Right 02/06/2020   Procedure: AMPUTATION TOE;  Surgeon: Newt Minion, MD;  Location: [facility];  Service: Orthopedics;  Laterality: Right;   AMPUTATION TOE Right 08/17/2020   Procedure: AMPUTATION TOE 4th toe;  Surgeon: Trula Slade, DPM;  Location: [facility];  Service: Podiatry;  Laterality: Right;   APPENDECTOMY     CHOLECYSTECTOMY     CORONARY ARTERY BYPASS GRAFT     x3 (left internal mammary artery to distal left anterior descending coronary artery, saphenous vain graft to second circumflex marginal branch, saphenous vain graft to posterior descending coronary artery, endoscopic saphenous vain harvest from right thigh) and modified Cox - Maze IV procedure.  [name],MD. Electronically signed CHO/MEDQ D: 01/09/2008/ JOB: [id number] cc:  [name]   CRANIOTOMY  07/30/2012   Procedure: CRANIOTOMY HEMATOMA EVACUATION SUBDURAL;  Surgeon: [name], MD;  Location: [facility];  Service: Neurosurgery;  Laterality: Right;  Right craniotomy for evacuation of subdural hematoma   FOOT SURGERY     HERNIA REPAIR     INTRAMEDULLARY (IM) NAIL INTERTROCHANTERIC Right 02/04/2020   Procedure: INTRAMEDULLARY (IM) NAIL INTERTROCHANTRIC;  Surgeon: Netta Cedars, MD;  Location: [facility];  Service: Orthopedics;  Laterality: Right;    ORCHIECTOMY     Left  /  testicular cancer   PACEMAKER PLACEMENT     PPM - St. Jude   PPM GENERATOR CHANGEOUT N/A 06/25/2019   Procedure: PPM GENERATOR CHANGEOUT;  Surgeon: Evans Lance, MD;  Location: [facility];  Service: Cardiovascular;  Laterality: N/A;    Social History   Socioeconomic History   Marital status: Married    Spouse name: Vikky   Number of children: 2   Years of education: Not on file   Highest education level: Not on file  Occupational History   Occupation: Retired- IT trainer  Tobacco Use   Smoking status: Former    Packs/day: 2.00    Years: 20.00    Pack years: 40.00    Types: Cigarettes    Quit date: 02/21/1991    Years since quitting: 30.6   Smokeless tobacco: Never  Vaping Use   Vaping Use: Never used  Substance and Sexual Activity   Alcohol use: No    Alcohol/week: 0.0 standard drinks   Drug use: No   Sexual activity: Not Currently  Other Topics Concern   Not on file  Social History Narrative   Lives with wife   Right handed    Married with two children.     He is a Engineer, structural.     Social Determinants of Health   Financial Resource Strain: Not on file  Food Insecurity: Not on file  Transportation Needs: Not on file  Physical Activity: Not on file  Stress: Not on file  Social Connections: Not on file  Intimate Partner Violence: Not on file    Family History  Problem Relation Age of Onset   Heart disease Father    Heart attack Father    Heart failure Father    Heart disease Mother    Alzheimer's disease Mother    Dementia Mother     ROS: no fevers or chills, productive cough, hemoptysis, dysphasia, odynophagia, melena, hematochezia, dysuria, hematuria, rash, seizure activity, orthopnea, PND, pedal edema, claudication. Remaining systems are negative.  Physical Exam: Well-developed well-nourished in no acute distress.  Skin is warm and dry.  HEENT is normal.  Neck is supple.  Chest is clear to auscultation with  normal expansion.  Cardiovascular exam is irregular Abdominal exam nontender or distended. No masses palpated. Extremities show no edema. neuro grossly intact  ECG-atrial fibrillation at a rate of 94, right bundle branch block.  Personally reviewed  A/P  1 acute on chronic diastolic/right-sided congestive heart failure-Right-sided CHF is felt secondary to pulmonary venous hypertension, pulmonary fibrosis and sleep apnea.  He is volume overloaded on examination likely secondary to recurrent atrial fibrillation.  Will check potassium, renal function and BNP today given recent increase in Demadex.  I will increase further to 60 mg twice daily.  Check potassium and renal function again in 1 week.  We discussed fluid restriction and low-sodium diet.  Repeat echocardiogram.   2 persistent atrial fibrillation-patient remains in atrial fibrillation.  He is not a candidate for anticoagulation given history of spontaneous subdural hematoma.  Continue metoprolol for rate control.  We will check monitor to make sure that rate is adequately controlled.  Atrial fibrillation is likely contributing to recurrent CHF.   3 ectopic atrial tachycardia-continue beta-blocker.   4 severe tricuspid regurgitation-felt secondary to right ventricular enlargement due to elevated pulmonary pressures versus trauma from prior pacemaker.  Patient is not a surgical candidate given multiple comorbidities.   5 coronary artery disease-continue statin.  No aspirin given history of intracranial hemorrhage.   6 pacemaker-Per electrophysiology.   7 hyperlipidemia-continue statin.   8 history of cirrhosis/pulmonary fibrosis/chronic anemia/thrombocytopenia-followed by primary care.  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  Kirk Ruths, MD

## 2021-10-07 DIAGNOSIS — R051 Acute cough: Secondary | ICD-10-CM | POA: Diagnosis not present

## 2021-10-07 DIAGNOSIS — R0989 Other specified symptoms and signs involving the circulatory and respiratory systems: Secondary | ICD-10-CM | POA: Diagnosis not present

## 2021-10-11 ENCOUNTER — Other Ambulatory Visit: Payer: Self-pay

## 2021-10-11 ENCOUNTER — Encounter: Payer: Self-pay | Admitting: Cardiology

## 2021-10-11 ENCOUNTER — Ambulatory Visit (INDEPENDENT_AMBULATORY_CARE_PROVIDER_SITE_OTHER): Payer: Medicare Other | Admitting: Cardiology

## 2021-10-11 ENCOUNTER — Ambulatory Visit (INDEPENDENT_AMBULATORY_CARE_PROVIDER_SITE_OTHER): Payer: Medicare Other

## 2021-10-11 VITALS — BP 105/78 | HR 94 | Ht 68.0 in | Wt 214.2 lb

## 2021-10-11 DIAGNOSIS — I480 Paroxysmal atrial fibrillation: Secondary | ICD-10-CM | POA: Diagnosis not present

## 2021-10-11 DIAGNOSIS — I2510 Atherosclerotic heart disease of native coronary artery without angina pectoris: Secondary | ICD-10-CM

## 2021-10-11 DIAGNOSIS — I10 Essential (primary) hypertension: Secondary | ICD-10-CM

## 2021-10-11 DIAGNOSIS — I50812 Chronic right heart failure: Secondary | ICD-10-CM

## 2021-10-11 MED ORDER — TORSEMIDE 20 MG PO TABS: 60.0000 mg | ORAL_TABLET | Freq: Two times a day (BID) | ORAL | 3 refills | Status: DC

## 2021-10-11 NOTE — Patient Instructions (Signed)
Medication Instructions:   INCREASE TORSEMIDE TO 60 MG TWICE DAILY= 3 OF THE 20 MG TABLETS TWICE DAILY  *If you need a refill on your cardiac medications before your next appointment, please call your pharmacy*   Lab Work:  Your physician recommends that you HAVE LAB [place]  Your physician recommends that you return for lab work in: [place]  If you have labs (blood work) drawn today and your tests are completely normal, you will receive your results only by: [place] (if you have MyChart) OR A paper copy in the mail If you have any lab test that is abnormal or we need to change your treatment, we will call you to review the results.   Testing/Procedures:  Your physician has requested that you have an echocardiogram. Echocardiography is a painless test that uses sound waves to create images of your heart. It provides your doctor with information about the size and shape of your heart and how well your heart's chambers and valves are working. This procedure takes approximately one hour. There are no restrictions for this procedure. [place] Instructions  Your physician has requested you wear a ZIO patch monitor for 3 days.  This is a single patch monitor. [employer] supplies one patch monitor per enrollment. Additional stickers are not available. Please do not apply patch if you will be having a Nuclear Stress Test,  Echocardiogram, Cardiac CT, MRI, or Chest Xray during the period you would be wearing the  monitor. The patch cannot be worn during these tests. You cannot remove and re-apply the  ZIO XT patch monitor.  Your ZIO patch monitor will be mailed 3 day USPS to your address on file. It may take 3-5 days  to receive your monitor after you have been enrolled.  Once you have received your monitor, please review the enclosed instructions. Your monitor  has already been registered assigning a specific monitor serial # to  you.  Billing and Patient Assistance Program Information  We have supplied [employer] with any of your insurance information on file for billing purposes. [employer] offers a sliding scale Patient Assistance Program for patients that do not have  insurance, or whose insurance does not completely cover the cost of the ZIO monitor.  You must apply for the Patient Assistance Program to qualify for this discounted rate.  To apply, please call [employer] at [phone], select option 4, select option 2, ask to apply for  Patient Assistance Program. Theodore Demark will ask your household income, and how many people  are in your household. They will quote your out-of-pocket cost based on that information.  [employer] will also be able to set up a 46-month, interest-free payment plan if needed.  Applying the monitor   Shave hair from upper left chest.  Hold abrader disc by orange tab. Rub abrader in 40 strokes over the upper left chest as  indicated in your monitor instructions.  Clean area with 4 enclosed alcohol pads. Let dry.  Apply patch as indicated in monitor instructions. Patch will be placed under collarbone on left  side of chest with arrow pointing upward.  Rub patch adhesive wings for 2 minutes. Remove white label marked "1". Remove the white  label marked "2". Rub patch adhesive wings for 2 additional minutes.  While looking in a mirror, press and release button in center of patch. A small green light will  flash 3-4 times. This will be your only indicator that  the monitor has been turned on.  Do not shower for the first 24 hours. You may shower after the first 24 hours.  Press the button if you feel a symptom. You will hear a small click. Record Date, Time and  Symptom in the Patient Logbook.  When you are ready to remove the patch, follow instructions on the last 2 pages of Patient  Logbook. Stick patch monitor onto the last page of Patient Logbook.  Place Patient Logbook in the blue and white box.  Use locking tab on box and tape box closed  securely. The blue and white box has prepaid postage on it. Please place it in the mailbox as  soon as possible. Your physician should have your test results approximately 7 days after the  monitor has been mailed back to [facility].  Call [facility] at [phone] if you have questions regarding  your ZIO XT patch monitor. Call them immediately if you see an orange light blinking on your  monitor.  If your monitor falls off in less than 4 days, contact our Monitor department at [phone].  If your monitor becomes loose or falls off after 4 days call [employer] at [phone] for  suggestions on securing your monitor    Follow-Up: At [facility], you and your health needs are our priority.  As part of our continuing mission to provide you with exceptional heart care, we have created designated Provider Care Teams.  These Care Teams include your primary Cardiologist (physician) and Advanced Practice Providers (APPs -  Physician Assistants and Nurse Practitioners) who all work together to provide you with the care you need, when you need it.  We recommend signing up for the patient portal called "MyChart".  Sign up information is provided on this After Visit Summary.  MyChart is used to connect with patients for Virtual Visits (Telemedicine).  Patients are able to view lab/test results, encounter notes, upcoming appointments, etc.  Non-urgent messages can be sent to your provider as well.   To learn more about what you can do with MyChart, go to [email or web address].    Your next appointment:   6 week(s)  The format for your next appointment:   In Person  Provider:   You will see one of the following Advanced Practice Providers on your designated Care Team:   Sande Rives, PA-C Coletta Memos, FNP  Then, Kirk Ruths, MD will plan to see you again in 6 month(s).

## 2021-10-11 NOTE — Progress Notes (Unsigned)
Patient enrolled for [employer] to mail a 3 day Zio XT monitor to his address on file.

## 2021-10-12 LAB — BASIC METABOLIC PANEL
BUN/Creatinine Ratio: 19 (ref 10–24)
BUN: 32 mg/dL — ABNORMAL HIGH (ref 8–27)
CO2: 23 mmol/L (ref 20–29)
Calcium: 9.2 mg/dL (ref 8.6–10.2)
Chloride: 103 mmol/L (ref 96–106)
Creatinine, Ser: 1.69 mg/dL — ABNORMAL HIGH (ref 0.76–1.27)
Glucose: 148 mg/dL — ABNORMAL HIGH (ref 70–99)
Potassium: 3.9 mmol/L (ref 3.5–5.2)
Sodium: 145 mmol/L — ABNORMAL HIGH (ref 134–144)
eGFR: 41 mL/min/{1.73_m2} — ABNORMAL LOW (ref >59)

## 2021-10-12 LAB — PRO B NATRIURETIC PEPTIDE: NT-Pro BNP: 1495 pg/mL — ABNORMAL HIGH (ref 0–486)

## 2021-10-14 ENCOUNTER — Encounter: Payer: Self-pay | Admitting: *Deleted

## 2021-10-16 DIAGNOSIS — I480 Paroxysmal atrial fibrillation: Secondary | ICD-10-CM | POA: Diagnosis not present

## 2021-10-19 ENCOUNTER — Emergency Department (HOSPITAL_BASED_OUTPATIENT_CLINIC_OR_DEPARTMENT_OTHER): Payer: Medicare Other | Admitting: Radiology

## 2021-10-19 ENCOUNTER — Emergency Department (HOSPITAL_BASED_OUTPATIENT_CLINIC_OR_DEPARTMENT_OTHER): Payer: Medicare Other

## 2021-10-19 ENCOUNTER — Other Ambulatory Visit: Payer: Self-pay

## 2021-10-19 ENCOUNTER — Encounter (HOSPITAL_BASED_OUTPATIENT_CLINIC_OR_DEPARTMENT_OTHER): Payer: Self-pay

## 2021-10-19 DIAGNOSIS — S8002XA Contusion of left knee, initial encounter: Secondary | ICD-10-CM | POA: Diagnosis not present

## 2021-10-19 DIAGNOSIS — Z79899 Other long term (current) drug therapy: Secondary | ICD-10-CM | POA: Insufficient documentation

## 2021-10-19 DIAGNOSIS — I10 Essential (primary) hypertension: Secondary | ICD-10-CM | POA: Diagnosis not present

## 2021-10-19 DIAGNOSIS — M79631 Pain in right forearm: Secondary | ICD-10-CM | POA: Diagnosis not present

## 2021-10-19 DIAGNOSIS — Z950 Presence of cardiac pacemaker: Secondary | ICD-10-CM | POA: Diagnosis not present

## 2021-10-19 DIAGNOSIS — Z87891 Personal history of nicotine dependence: Secondary | ICD-10-CM | POA: Diagnosis not present

## 2021-10-19 DIAGNOSIS — S0083XA Contusion of other part of head, initial encounter: Secondary | ICD-10-CM | POA: Insufficient documentation

## 2021-10-19 DIAGNOSIS — E1169 Type 2 diabetes mellitus with other specified complication: Secondary | ICD-10-CM | POA: Insufficient documentation

## 2021-10-19 DIAGNOSIS — S80211A Abrasion, right knee, initial encounter: Secondary | ICD-10-CM | POA: Insufficient documentation

## 2021-10-19 DIAGNOSIS — I2510 Atherosclerotic heart disease of native coronary artery without angina pectoris: Secondary | ICD-10-CM | POA: Insufficient documentation

## 2021-10-19 DIAGNOSIS — S41111A Laceration without foreign body of right upper arm, initial encounter: Secondary | ICD-10-CM | POA: Insufficient documentation

## 2021-10-19 DIAGNOSIS — S0081XA Abrasion of other part of head, initial encounter: Secondary | ICD-10-CM | POA: Diagnosis not present

## 2021-10-19 DIAGNOSIS — E785 Hyperlipidemia, unspecified: Secondary | ICD-10-CM | POA: Insufficient documentation

## 2021-10-19 DIAGNOSIS — W010XXA Fall on same level from slipping, tripping and stumbling without subsequent striking against object, initial encounter: Secondary | ICD-10-CM | POA: Diagnosis not present

## 2021-10-19 DIAGNOSIS — Z951 Presence of aortocoronary bypass graft: Secondary | ICD-10-CM | POA: Insufficient documentation

## 2021-10-19 DIAGNOSIS — S40021A Contusion of right upper arm, initial encounter: Secondary | ICD-10-CM | POA: Diagnosis not present

## 2021-10-19 DIAGNOSIS — S80212A Abrasion, left knee, initial encounter: Secondary | ICD-10-CM | POA: Diagnosis not present

## 2021-10-19 DIAGNOSIS — S0990XA Unspecified injury of head, initial encounter: Secondary | ICD-10-CM | POA: Diagnosis present

## 2021-10-19 DIAGNOSIS — S8001XA Contusion of right knee, initial encounter: Secondary | ICD-10-CM | POA: Diagnosis not present

## 2021-10-19 NOTE — ED Triage Notes (Addendum)
Pt reports he tripped over his feet tonight landing on concrete. Pt has abrasions to his Right side forehead, Right forearm and bilateral knee. Bleeding controlled at this time. Several skin tares to forearm. Pt denies LOC, N/V or dizziness prior to fall  pt does not take a blood thinner   Bleeding controlled, arm dressed in triage

## 2021-10-20 ENCOUNTER — Emergency Department (HOSPITAL_BASED_OUTPATIENT_CLINIC_OR_DEPARTMENT_OTHER)
Admission: EM | Admit: 2021-10-20 | Discharge: 2021-10-20 | Disposition: A | Discharge status: Home / Self Care | Payer: Medicare Other | Attending: Emergency Medicine | Admitting: Emergency Medicine

## 2021-10-20 DIAGNOSIS — S40021A Contusion of right upper arm, initial encounter: Secondary | ICD-10-CM

## 2021-10-20 DIAGNOSIS — W010XXA Fall on same level from slipping, tripping and stumbling without subsequent striking against object, initial encounter: Secondary | ICD-10-CM

## 2021-10-20 DIAGNOSIS — S0083XA Contusion of other part of head, initial encounter: Secondary | ICD-10-CM

## 2021-10-20 DIAGNOSIS — S0081XA Abrasion of other part of head, initial encounter: Secondary | ICD-10-CM

## 2021-10-20 DIAGNOSIS — T148XXA Other injury of unspecified body region, initial encounter: Secondary | ICD-10-CM

## 2021-10-20 DIAGNOSIS — S80212A Abrasion, left knee, initial encounter: Secondary | ICD-10-CM

## 2021-10-20 NOTE — ED Provider Notes (Signed)
[facility] Provider Note: Georgena Spurling, MD, FACEP  CSN: [id number] MRN: [id number] ARRIVAL: 10/19/21 at [place]: [id number]/[id number]   CHIEF COMPLAINT  Fall   HISTORY OF PRESENT ILLNESS  10/20/21 12:12 AM [name]. is a 80 y.o. male who tripped and landed on concrete at his house about 6 PM yesterday evening.  He struck his right forehead, his right forearm and his bilateral knees.  He has abrasions and hematoma to his right forehead.  He has multiple ecchymoses and skin tears to his right forearm and upper arm.  He has a superficial abrasion to his left knee.  He rates associated pain in his arm is a 5 out of 10, throbbing in nature and worse with movement.  He did not lose consciousness.  He has not been vomiting.  He is not having neck pain.  His tetanus is up-to-date.   Past Medical History:  Diagnosis Date   (HFpEF) heart failure with preserved ejection fraction ([place])    a. 05/2013 Echo: EF 55%, mild LVH, diast dysfxn, Ao sclerosis, mildly dil LA, RV dysfxn (poorly visualized), PASP 50mmHg;  b. 03/2017 Echo: EF 55-60%, no rwma, triv MR, mildly dil RV, mod TR, PASP 13mmHg.   Atrial fibrillation ([place])    s/p Cox Maze 1/09;  Multaq Rx d/c'd in 2014 due to pulmo fibrosis;  coumadin d/c'd in 2014 due to spontaneous subdural hematoma   BPH (benign prostatic hyperplasia)    CAD (coronary artery disease), native coronary artery    a. s/p CABG 12/2007;  b. Myoview 12/2011: EF 66%, no scar or ischemia; normal.   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 Jude   Peripheral neuropathy 07/31/2018   Pulmonary fibrosis ([place])    Multaq d/c'd 7/14   Subdural hematoma 07/2012   spontaneous;  coumadin d/c'd => no longer a candidate for anticoagulation    Past Surgical History:  Procedure Laterality Date   AMPUTATION Left  05/17/2019   Procedure: AMPUTATION LEFT FOURTH TOE;  Surgeon: Meredith Pel, MD;  Location: [facility];  Service: Orthopedics;  Laterality: Left;   AMPUTATION TOE Right 02/06/2020   Procedure: AMPUTATION TOE;  Surgeon: Newt Minion, MD;  Location: [facility];  Service: Orthopedics;  Laterality: Right;   AMPUTATION TOE Right 08/17/2020   Procedure: AMPUTATION TOE 4th toe;  Surgeon: Trula Slade, DPM;  Location: [facility];  Service: Podiatry;  Laterality: Right;   APPENDECTOMY     CHOLECYSTECTOMY     CORONARY ARTERY BYPASS GRAFT     x3 (left internal mammary artery to distal left anterior descending coronary artery, saphenous vain graft to second circumflex marginal branch, saphenous vain graft to posterior descending coronary artery, endoscopic saphenous vain harvest from right thigh) and modified Cox - Maze IV procedure.  [name],MD. Electronically signed CHO/MEDQ D: 01/09/2008/ JOB: [id number] cc:  [name]   CRANIOTOMY  07/30/2012   Procedure: CRANIOTOMY HEMATOMA EVACUATION SUBDURAL;  Surgeon: [name], MD;  Location: [facility];  Service: Neurosurgery;  Laterality: Right;  Right craniotomy for evacuation of subdural hematoma   FOOT SURGERY     HERNIA REPAIR     INTRAMEDULLARY (IM) NAIL INTERTROCHANTERIC Right 02/04/2020   Procedure: INTRAMEDULLARY (IM) NAIL INTERTROCHANTRIC;  Surgeon: Netta Cedars, MD;  Location: [facility];  Service: Orthopedics;  Laterality: Right;   ORCHIECTOMY     Left  /  testicular cancer   PACEMAKER PLACEMENT     PPM - St. Jude   PPM GENERATOR CHANGEOUT N/A 06/25/2019   Procedure: PPM GENERATOR CHANGEOUT;  Surgeon: Evans Lance, MD;  Location: [facility];  Service: Cardiovascular;  Laterality: N/A;    Family History  Problem Relation Age of Onset   Heart disease Father    Heart attack Father    Heart failure Father    Heart disease Mother    Alzheimer's disease Mother    Dementia Mother     Social History   Tobacco Use   Smoking  status: Former    Packs/day: 2.00    Years: 20.00    Pack years: 40.00    Types: Cigarettes    Quit date: 02/21/1991    Years since quitting: 30.6   Smokeless tobacco: Never  Vaping Use   Vaping Use: Never used  Substance Use Topics   Alcohol use: No    Alcohol/week: 0.0 standard drinks   Drug use: No    Prior to Admission medications   Medication Sig Start Date End Date Taking? Authorizing Provider  acetaminophen (TYLENOL) 325 MG tablet Take 1-2 tablets (325-650 mg total) by mouth every 4 (four) hours as needed for mild pain. 02/24/20   Angiulli, Lavon Paganini, PA-C  acetaminophen (TYLENOL) 325 MG tablet 1 tablet as needed    [provider]  allopurinol (ZYLOPRIM) 300 MG tablet Take 1 tablet (300 mg total) by mouth daily. 02/24/20   Angiulli, Lavon Paganini, PA-C  azithromycin (ZITHROMAX) 250 MG tablet Take 250 mg by mouth. 10/10/21   [provider]  colchicine 0.6 MG tablet TAKE 1 TO 3 TABLETS AS NEEDED DAILY    [provider]  ferrous sulfate 325 (65 FE) MG tablet Take 1 tablet (325 mg total) by mouth daily with breakfast. 02/24/20   Angiulli, Lavon Paganini, PA-C  memantine (NAMENDA) 10 MG tablet Take 10 mg by mouth 2 (two) times daily. 08/29/20   [provider]  metoprolol succinate (TOPROL-XL) 25 MG 24 hr tablet Take 0.5 tablets (12.5 mg total) by mouth in the morning and at bedtime. 08/16/21   Evans Lance, MD  mirabegron ER (MYRBETRIQ) 50 MG TB24 tablet Take 1 tablet (50 mg total) by mouth daily. 02/24/20   Angiulli, Lavon Paganini, PA-C  modafinil (PROVIGIL) 100 MG tablet Take 0.5 tablets (50 mg total) by mouth daily. 03/08/21   Ward Givens, NP  Multiple Vitamin (MULTI VITAMIN) TABS 1 tablet    [provider]  Multiple Vitamin (MULTIVITAMIN WITH MINERALS) TABS tablet Take 1 tablet by mouth daily. 02/10/20   Guilford Shi, MD  pantoprazole (PROTONIX) 40 MG tablet Take 1 tablet (40 mg total) by mouth daily. 02/24/20   Angiulli, Lavon Paganini, PA-C  predniSONE  (DELTASONE) 20 MG tablet Take 40 mg by mouth daily. 10/07/21   [provider]  rosuvastatin (CRESTOR) 40 MG tablet Take 1 tablet (40 mg total) by mouth daily. 02/24/20   Angiulli, Lavon Paganini, PA-C  sertraline (ZOLOFT) 100 MG tablet Take 2 tablets (200 mg total) by mouth daily. 02/24/20   Angiulli, Lavon Paganini, PA-C  tamsulosin (FLOMAX) 0.4 MG CAPS capsule Take 1 capsule (0.4 mg total) by mouth at bedtime. 02/24/20   Angiulli, Lavon Paganini, PA-C  Teriparatide, Recombinant, 620 MCG/2.48ML SOPN inject 20 mcg Subcutaneous Once a day 04/11/21  torsemide (DEMADEX) 20 MG tablet Take 3 tablets (60 mg total) by mouth 2 (two) times daily. 10/11/21   [name], MD    Allergies Aricept [donepezil], Baclofen, Lipitor [atorvastatin], Nsaids, Warfarin and related, and Enbrel [etanercept]   REVIEW OF SYSTEMS  Negative except as noted here or in the History of Present Illness.   PHYSICAL EXAMINATION  Initial Vital Signs Blood pressure 126/78, pulse 93, temperature 98.2 F (36.8 C), temperature source Oral, resp. rate 14, SpO2 100 %.  Examination General: Well-developed, well-nourished male in no acute distress; appearance consistent with age of record HENT: normocephalic; right forehead abrasions and hematoma:    Eyes: pupils equal, round and reactive to light; extraocular muscles intact Neck: supple; nontender Heart: regular rate and rhythm Lungs: clear to auscultation bilaterally Abdomen: soft; nondistended; nontender; bowel sounds present Extremities: No deformity; pulses normal Neurologic: Awake, alert and oriented; motor function intact in all extremities and symmetric; no facial droop Skin: Warm and dry; chronic appearing stasis changes of lower legs; superficial abrasion left knee; skin tears and ecchymoses of right upper extremity:      Psychiatric: Normal mood and affect   RESULTS  Summary of this visit's results, reviewed and interpreted by myself:   EKG  Interpretation  Date/Time:    Ventricular Rate:    PR Interval:    QRS Duration:   QT Interval:    QTC Calculation:   R Axis:     Text Interpretation:         Laboratory Studies: No results found for this or any previous visit (from the past 24 hour(s)). Imaging Studies: DG Forearm Right  Result Date: 10/19/2021 CLINICAL DATA:  Fall, right arm injury EXAM: RIGHT FOREARM - 2 VIEW COMPARISON:  None. FINDINGS: There is no evidence of fracture or other focal bone lesions. Soft tissues are unremarkable. IMPRESSION: Negative. Electronically Signed   By: Fidela Salisbury M.D.   On: 10/19/2021 21:44   CT Head Wo Contrast  Result Date: 10/19/2021 CLINICAL DATA:  Fall, right forehead abrasions EXAM: CT HEAD WITHOUT CONTRAST TECHNIQUE: Contiguous axial images were obtained from the base of the skull through the vertex without intravenous contrast. COMPARISON:  02/04/2020 FINDINGS: Brain: No evidence of acute infarction, hemorrhage, hydrocephalus, extra-axial collection or mass lesion/mass effect. Subcortical white matter and periventricular small vessel ischemic changes. Vascular: No hyperdense vessel or unexpected calcification. Skull: Normal. Negative for fracture or focal lesion. Right frontoparietal craniotomy. Sinuses/Orbits: The visualized paranasal sinuses are essentially clear. The mastoid air cells are unopacified. Other: None. IMPRESSION: No evidence of acute intracranial abnormality. Small vessel ischemic changes. Electronically Signed   By: Julian Hy M.D.   On: 10/19/2021 21:37    ED COURSE and MDM  Nursing notes, initial and subsequent vitals signs, including pulse oximetry, reviewed and interpreted by myself.  Vitals:   10/19/21 2103 10/20/21 0002  BP: (!) 130/92 126/78  Pulse: 85 93  Resp: 18 14  Temp: 98.2 F (36.8 C)   TempSrc: Oral   SpO2: 100% 100%   Medications - No data to display  Patient advised of reassuring radiographic studies.  He does have a history of  intracranial hemorrhage in the past.  We will have his wounds dressed.  He states he has had a tetanus booster in the last 5 years.  PROCEDURES  Procedures   ED DIAGNOSES     ICD-10-CM   1. Fall from slip, trip, or stumble, initial encounter  W01.0XXA     2. Traumatic hematoma of  forehead, initial encounter  S00.83XA     3. Forehead abrasion, initial encounter  S00.81XA     4. Abrasion, left knee, initial encounter  S80.212A     5. Multiple skin tears  T14.8XXA     6. Traumatic ecchymosis of multiple sites of right upper arm, initial encounter  S40.021A          Shanon Rosser, MD 10/20/21 [phone]

## 2021-10-25 ENCOUNTER — Telehealth: Payer: Self-pay | Admitting: Cardiology

## 2021-10-25 DIAGNOSIS — I480 Paroxysmal atrial fibrillation: Secondary | ICD-10-CM | POA: Diagnosis not present

## 2021-10-25 NOTE — Telephone Encounter (Signed)
Magda Paganini from [facility] calling with abnormal zio patch results.

## 2021-10-25 NOTE — Telephone Encounter (Signed)
Fast a fib 181 bpm for 60 secs. Page 8 strip 6.

## 2021-10-26 ENCOUNTER — Telehealth: Payer: Self-pay | Admitting: *Deleted

## 2021-10-26 MED ORDER — METOPROLOL SUCCINATE ER 50 MG PO TB24: 50.0000 mg | ORAL_TABLET | Freq: Every day | ORAL | 3 refills | Status: DC

## 2021-10-26 NOTE — Telephone Encounter (Signed)
Spoke with pt wife, he is only taking 12.5 mg of metoprolol once daily. Per dr [name], patient will increase metoprolol to 50 mg once daily. They will follow his heart rate and bp.

## 2021-10-26 NOTE — Telephone Encounter (Signed)
-----   Message from [name], MD sent at 10/26/2021  7:14 AM EST ----- Change toprol to 75 mg daily; follow HR and BP. Kirk Ruths

## 2021-10-27 DIAGNOSIS — I50812 Chronic right heart failure: Secondary | ICD-10-CM | POA: Diagnosis not present

## 2021-10-28 LAB — BASIC METABOLIC PANEL
BUN/Creatinine Ratio: 15 (ref 10–24)
BUN: 25 mg/dL (ref 8–27)
CO2: 26 mmol/L (ref 20–29)
Calcium: 9.1 mg/dL (ref 8.6–10.2)
Chloride: 102 mmol/L (ref 96–106)
Creatinine, Ser: 1.71 mg/dL — ABNORMAL HIGH (ref 0.76–1.27)
Glucose: 110 mg/dL — ABNORMAL HIGH (ref 70–99)
Potassium: 3.7 mmol/L (ref 3.5–5.2)
Sodium: 141 mmol/L (ref 134–144)
eGFR: 40 mL/min/{1.73_m2} — ABNORMAL LOW (ref >59)

## 2021-10-31 ENCOUNTER — Encounter: Payer: Self-pay | Admitting: *Deleted

## 2021-11-02 ENCOUNTER — Ambulatory Visit (HOSPITAL_COMMUNITY): Payer: Medicare Other | Attending: Cardiovascular Disease

## 2021-11-02 ENCOUNTER — Telehealth: Payer: Self-pay | Admitting: Pulmonary Disease

## 2021-11-02 ENCOUNTER — Telehealth: Payer: Self-pay | Admitting: Cardiology

## 2021-11-02 ENCOUNTER — Other Ambulatory Visit: Payer: Self-pay

## 2021-11-02 DIAGNOSIS — I480 Paroxysmal atrial fibrillation: Secondary | ICD-10-CM | POA: Diagnosis not present

## 2021-11-02 DIAGNOSIS — I50812 Chronic right heart failure: Secondary | ICD-10-CM

## 2021-11-02 LAB — ECHOCARDIOGRAM COMPLETE
Area-P 1/2: 4.53 cm2
S' Lateral: 3 cm

## 2021-11-02 NOTE — Telephone Encounter (Signed)
Schedule for follow-up appointment

## 2021-11-02 NOTE — Telephone Encounter (Signed)
Pt c/o swelling: STAT is pt has developed SOB within 24 hours  How much weight have you gained and in what time span?  Patient's wife states the patient has visibly gained weight, but their scale broken so she has no way to keep track of his weights If swelling, where is the swelling located?  Legs   Are you currently taking a fluid pill?  Yes Are you currently SOB?  No  Do you have a log of your daily weights (if so, list)?  No log available  Have you gained 3 pounds in a day or 5 pounds in a week?  Unsure  Have you traveled recently?  No

## 2021-11-02 NOTE — Telephone Encounter (Signed)
Patient last seen august 2021. Patient has OSA and IPF. No follow up scheduled.

## 2021-11-02 NOTE — Telephone Encounter (Signed)
Returned call to patient's wife  She reports visible weight gain and swelling - scales broken so no recorded weights He has had this since Sunday/Monday She reports the swelling is from his knees up to thighs, which is painful for him He is also pretty short of breath per wife and a cough She reports his heart racing - she has a pulse ox and HR was 100bpm (he is on meto succ 50mg  daily per 11/9 note)  Patient prescribed torsemide 60mg  BID - she is not sure patient has been taking it this way (she will verify this)  Advised that I cannot tell him to increase diuretic with recent increase in creatinine   Advised will send a message to MD to review and advise. Explained call back may be tomorrow

## 2021-11-03 MED ORDER — TORSEMIDE 40 MG PO TABS: 80.0000 mg | ORAL_TABLET | Freq: Two times a day (BID) | ORAL | 3 refills | Status: DC

## 2021-11-03 NOTE — Telephone Encounter (Signed)
Spoke with pt, Aware of dr crenshaw's recommendations.  ?New script sent to the pharmacy  ?Lab orders mailed to the pt  ?

## 2021-11-03 NOTE — Telephone Encounter (Signed)
I have called and spoke with pt and his wife and they stated that they would like to hold off of scheduling an appt at this time.  Advised her to call the office back when they are ready.

## 2021-11-06 ENCOUNTER — Emergency Department (HOSPITAL_COMMUNITY)
Admission: EM | Admit: 2021-11-06 | Discharge: 2021-11-06 | Disposition: A | Discharge status: Home / Self Care | Payer: Medicare Other | Attending: Emergency Medicine | Admitting: Emergency Medicine

## 2021-11-06 ENCOUNTER — Other Ambulatory Visit: Payer: Self-pay

## 2021-11-06 ENCOUNTER — Emergency Department (HOSPITAL_COMMUNITY): Payer: Medicare Other

## 2021-11-06 DIAGNOSIS — Z9889 Other specified postprocedural states: Secondary | ICD-10-CM | POA: Diagnosis not present

## 2021-11-06 DIAGNOSIS — R0902 Hypoxemia: Secondary | ICD-10-CM | POA: Diagnosis not present

## 2021-11-06 DIAGNOSIS — R0602 Shortness of breath: Secondary | ICD-10-CM | POA: Insufficient documentation

## 2021-11-06 DIAGNOSIS — E1122 Type 2 diabetes mellitus with diabetic chronic kidney disease: Secondary | ICD-10-CM | POA: Diagnosis not present

## 2021-11-06 DIAGNOSIS — W01198A Fall on same level from slipping, tripping and stumbling with subsequent striking against other object, initial encounter: Secondary | ICD-10-CM | POA: Insufficient documentation

## 2021-11-06 DIAGNOSIS — I2510 Atherosclerotic heart disease of native coronary artery without angina pectoris: Secondary | ICD-10-CM | POA: Insufficient documentation

## 2021-11-06 DIAGNOSIS — Z950 Presence of cardiac pacemaker: Secondary | ICD-10-CM | POA: Diagnosis not present

## 2021-11-06 DIAGNOSIS — R102 Pelvic and perineal pain: Secondary | ICD-10-CM | POA: Diagnosis not present

## 2021-11-06 DIAGNOSIS — S41012A Laceration without foreign body of left shoulder, initial encounter: Secondary | ICD-10-CM | POA: Insufficient documentation

## 2021-11-06 DIAGNOSIS — I5032 Chronic diastolic (congestive) heart failure: Secondary | ICD-10-CM | POA: Diagnosis not present

## 2021-11-06 DIAGNOSIS — Z951 Presence of aortocoronary bypass graft: Secondary | ICD-10-CM | POA: Insufficient documentation

## 2021-11-06 DIAGNOSIS — Z20822 Contact with and (suspected) exposure to covid-19: Secondary | ICD-10-CM | POA: Insufficient documentation

## 2021-11-06 DIAGNOSIS — Z79899 Other long term (current) drug therapy: Secondary | ICD-10-CM | POA: Diagnosis not present

## 2021-11-06 DIAGNOSIS — Z87891 Personal history of nicotine dependence: Secondary | ICD-10-CM | POA: Diagnosis not present

## 2021-11-06 DIAGNOSIS — I130 Hypertensive heart and chronic kidney disease with heart failure and stage 1 through stage 4 chronic kidney disease, or unspecified chronic kidney disease: Secondary | ICD-10-CM | POA: Insufficient documentation

## 2021-11-06 DIAGNOSIS — S41011A Laceration without foreign body of right shoulder, initial encounter: Secondary | ICD-10-CM | POA: Diagnosis not present

## 2021-11-06 DIAGNOSIS — S0181XA Laceration without foreign body of other part of head, initial encounter: Secondary | ICD-10-CM | POA: Insufficient documentation

## 2021-11-06 DIAGNOSIS — J90 Pleural effusion, not elsewhere classified: Secondary | ICD-10-CM | POA: Diagnosis not present

## 2021-11-06 DIAGNOSIS — N1831 Chronic kidney disease, stage 3a: Secondary | ICD-10-CM | POA: Diagnosis not present

## 2021-11-06 DIAGNOSIS — I10 Essential (primary) hypertension: Secondary | ICD-10-CM | POA: Diagnosis not present

## 2021-11-06 DIAGNOSIS — L03116 Cellulitis of left lower limb: Secondary | ICD-10-CM | POA: Diagnosis not present

## 2021-11-06 DIAGNOSIS — S81811A Laceration without foreign body, right lower leg, initial encounter: Secondary | ICD-10-CM | POA: Insufficient documentation

## 2021-11-06 DIAGNOSIS — R58 Hemorrhage, not elsewhere classified: Secondary | ICD-10-CM | POA: Diagnosis not present

## 2021-11-06 DIAGNOSIS — J984 Other disorders of lung: Secondary | ICD-10-CM | POA: Diagnosis not present

## 2021-11-06 DIAGNOSIS — S81812A Laceration without foreign body, left lower leg, initial encounter: Secondary | ICD-10-CM | POA: Diagnosis not present

## 2021-11-06 DIAGNOSIS — S0990XA Unspecified injury of head, initial encounter: Secondary | ICD-10-CM | POA: Diagnosis not present

## 2021-11-06 DIAGNOSIS — W19XXXA Unspecified fall, initial encounter: Secondary | ICD-10-CM

## 2021-11-06 DIAGNOSIS — M25511 Pain in right shoulder: Secondary | ICD-10-CM | POA: Diagnosis not present

## 2021-11-06 DIAGNOSIS — S199XXA Unspecified injury of neck, initial encounter: Secondary | ICD-10-CM | POA: Diagnosis not present

## 2021-11-06 DIAGNOSIS — M47812 Spondylosis without myelopathy or radiculopathy, cervical region: Secondary | ICD-10-CM | POA: Diagnosis not present

## 2021-11-06 LAB — CBC WITH DIFFERENTIAL/PLATELET
Abs Immature Granulocytes: 0.02 10*3/uL (ref 0.00–0.07)
Basophils Absolute: 0 10*3/uL (ref 0.0–0.1)
Basophils Relative: 1 %
Eosinophils Absolute: 0.1 10*3/uL (ref 0.0–0.5)
Eosinophils Relative: 1 %
HCT: 30.9 % — ABNORMAL LOW (ref 39.0–52.0)
Hemoglobin: 9.7 g/dL — ABNORMAL LOW (ref 13.0–17.0)
Immature Granulocytes: 1 %
Lymphocytes Relative: 11 %
Lymphs Abs: 0.5 10*3/uL — ABNORMAL LOW (ref 0.7–4.0)
MCH: 29.1 pg (ref 26.0–34.0)
MCHC: 31.4 g/dL (ref 30.0–36.0)
MCV: 92.8 fL (ref 80.0–100.0)
Monocytes Absolute: 0.3 10*3/uL (ref 0.1–1.0)
Monocytes Relative: 7 %
Neutro Abs: 3.4 10*3/uL (ref 1.7–7.7)
Neutrophils Relative %: 79 %
Platelets: 111 10*3/uL — ABNORMAL LOW (ref 150–400)
RBC: 3.33 MIL/uL — ABNORMAL LOW (ref 4.22–5.81)
RDW: 16.7 % — ABNORMAL HIGH (ref 11.5–15.5)
WBC: 4.3 10*3/uL (ref 4.0–10.5)
nRBC: 0 % (ref 0.0–0.2)

## 2021-11-06 LAB — COMPREHENSIVE METABOLIC PANEL
ALT: 16 U/L (ref 0–44)
AST: 27 U/L (ref 15–41)
Albumin: 3.4 g/dL — ABNORMAL LOW (ref 3.5–5.0)
Alkaline Phosphatase: 72 U/L (ref 38–126)
Anion gap: 10 (ref 5–15)
BUN: 27 mg/dL — ABNORMAL HIGH (ref 8–23)
CO2: 27 mmol/L (ref 22–32)
Calcium: 9.3 mg/dL (ref 8.9–10.3)
Chloride: 102 mmol/L (ref 98–111)
Creatinine, Ser: 1.78 mg/dL — ABNORMAL HIGH (ref 0.61–1.24)
GFR, Estimated: 39 mL/min — ABNORMAL LOW (ref >60)
Glucose, Bld: 100 mg/dL — ABNORMAL HIGH (ref 70–99)
Potassium: 3.7 mmol/L (ref 3.5–5.1)
Sodium: 139 mmol/L (ref 135–145)
Total Bilirubin: 1 mg/dL (ref 0.3–1.2)
Total Protein: 7 g/dL (ref 6.5–8.1)

## 2021-11-06 LAB — PROTIME-INR
INR: 1.4 — ABNORMAL HIGH (ref 0.8–1.2)
Prothrombin Time: 17.1 seconds — ABNORMAL HIGH (ref 11.4–15.2)

## 2021-11-06 LAB — RESP PANEL BY RT-PCR (FLU A&B, COVID) ARPGX2
Influenza A by PCR: NEGATIVE
Influenza B by PCR: NEGATIVE
SARS Coronavirus 2 by RT PCR: NEGATIVE

## 2021-11-06 LAB — APTT: aPTT: 32 seconds (ref 24–36)

## 2021-11-06 MED ORDER — CEPHALEXIN 500 MG PO CAPS: 500.0000 mg | ORAL_CAPSULE | Freq: Four times a day (QID) | ORAL | 0 refills | Status: AC

## 2021-11-06 NOTE — ED Triage Notes (Addendum)
Pt arrives via EMS from for mechanical [facility]. Pt was sitting on the toilet, slipped off, attempted to get back up, fell again hitting his head on the wall. No LOC. Does not take blood thinners. Pt c/o pain "from the ankles up". Pt reports bi;at leg swelling. Hx of afib, EMS reports he is in afib at this time. C collar in place. Hx of head bleed in 2019. Multiple skin tears to right arm. Abrasion to bridge of nose.

## 2021-11-06 NOTE — ED Notes (Signed)
Per NT, Pt was able to walk w/ assistance.  Pt normally uses a walker at home and was able to ambulate w/ 1 staff assist.  Pt took small and ginger steps.

## 2021-11-06 NOTE — Discharge Instructions (Addendum)
Dear [name].,  Thank you for allowing [place] to take care of you today.  We hope you begin feeling better soon.  - Please follow-up with your primary care physician or schedule an appointment to establish a primary care doctor if you do not have one already. - Please return to the Emergency Department or call 911 for chest pain, shortness of breath, severe pain, altered mental status, or if you have any reason to think you may need emergency medical care. - Prevent falls, use walker consistently - remember to stay well hydrated - Keep wounds clean and dry.  May apply topical bacitracin ointment and a nonstick dressing on skin tears -Monitor left leg closely for any worsening redness, pain, swelling, or other concerns as discussed -Take antibiotics until complete, even if you are feeling better -Follow-up with your PCP in the next week   Sincerely,  Cherly Hensen, DO [facility] of [facility], initial encounter  Fall - Plan: DG Pelvis 1-2 Views, DG Pelvis 1-2 Views

## 2021-11-06 NOTE — ED Notes (Signed)
Pt ambulated with this technician. Managed to successfully walk a couple feet with his arm around this technician. Stated that they usually walk with a walker at home, did not c/o dizziness or SOB.

## 2021-11-06 NOTE — ED Notes (Signed)
Pt presents after a fall. Multiple lacerations noted.  BLE swelling and discoloration noted.  Pt reports seeing is PCP for the swelling and sts "they didn't do anything."

## 2021-11-06 NOTE — ED Provider Notes (Signed)
[facility] EMERGENCY DEPARTMENT Provider Note   CSN: [id number] Arrival date & time: 11/06/21  1541     History Chief Complaint  Patient presents with   Fall    [name]. is a 80 y.o. male.  HPI  80 year old male with PMH significant for atrial fibrillation previously on warfarin, spontaneous SDH in 2013, HFpEF, CAD s/p CABG in 2009, DM, HTN, HLD, OSA and others as below who presents to the ED via EMS s/p GL F.  Patient reports that he falls frequently and has had several falls over the past week.  These are secondary to loss of balance.  He reports that today he was attempting to stand up off the toilet, when he lost his balance and fell forward, striking his face and nose on the wall and then the floor.  He denies any LOC, but does report nausea and one episode of emesis.  Endorses associated shortness of breath, increased from before the fall, and right shoulder pain.  He has multiple skin tears and bruises from other recent falls.  Denies any preceding symptoms before the fall, such as chest pain, shortness of breath, diaphoresis, palpitations, or other complaints.  Denies any dizziness or lightheadedness, visual changes, cough, chest pain, abdominal pain, lower extremity pain, weakness, or other concerns.  He is not currently anticoagulated, was discontinued after spontaneous SDH.  Past Medical History:  Diagnosis Date   (HFpEF) heart failure with preserved ejection fraction ([place])    a. 05/2013 Echo: EF 55%, mild LVH, diast dysfxn, Ao sclerosis, mildly dil LA, RV dysfxn (poorly visualized), PASP 64mmHg;  b. 03/2017 Echo: EF 55-60%, no rwma, triv MR, mildly dil RV, mod TR, PASP 39mmHg.   Atrial fibrillation ([place])    s/p Cox Maze 1/09;  Multaq Rx d/c'd in 2014 due to pulmo fibrosis;  coumadin d/c'd in 2014 due to spontaneous subdural hematoma   BPH (benign prostatic hyperplasia)    CAD (coronary artery disease), native coronary artery    a. s/p CABG 12/2007;   b. Myoview 12/2011: EF 66%, no scar or ischemia; normal.   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   Edema 05/31/2021   Gait abnormality 05/31/2021   Gastroesophageal reflux disease 05/31/2021   Gout 05/31/2021   Hearing loss 05/31/2021   History of adenomatous polyp of colon 05/31/2021   History of amputation of toe ([place]) 05/31/2021   History of atrial fibrillation 05/31/2021   History of COVID-19 05/31/2021   Increased frequency of urination 05/31/2021   Non-pressure chronic ulcer of other part of right foot limited to breakdown of skin ([place]) 05/31/2021   Osteoarthritis 05/31/2021   Osteomyelitis ([place]) 05/31/2021  Other specified postprocedural states 05/31/2021   Personal history of transient ischemic attack (TIA), and cerebral infarction without residual deficits 05/31/2021   Pneumonia due to coronavirus disease 2019 05/31/2021   Pure hypercholesterolemia 05/31/2021   Purpura senilis ([place]) 05/31/2021   Recurrent depression ([place]) 05/31/2021   Skin sensation disturbance 05/31/2021   Type 2 diabetes mellitus with foot ulcer (CODE) ([place]) 05/31/2021   Unspecified cirrhosis of liver ([place]) 05/31/2021   Uses hearing  aid 05/31/2021   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   Severe sepsis ([place]) 08/14/2020   Cellulitis of right lower leg 08/13/2020   Pulmonary fibrosis ([place]) 08/05/2020   OSA on CPAP 02/11/2020   Persistent hypersomnia 02/11/2020   Cognitive decline 02/11/2020   Hip fracture ([place]) 02/09/2020   Osteomyelitis of second toe of right foot ([place])    Closed right hip fracture, initial encounter ([place]) 02/04/2020   Closed intertrochanteric fracture of right hip, initial encounter ([place]) 02/04/2020   Closed nondisplaced intertrochanteric fracture of right femur ([place])    Neck pain 10/22/2019   Symptomatic bradycardia 09/24/2019   Chronic diastolic heart failure ([place]) 06/03/2019   Coronary artery disease involving native heart without angina pectoris 06/03/2019   Debility 05/26/2019   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   Toe infection 05/17/2019   DOE (dyspnea on exertion) 05/15/2019   Severe tricuspid regurgitation 05/15/2019   Chronic right-sided CHF (congestive heart failure) ([place]) 05/15/2019   Cellulitis of fourth toe of left foot    Excessive daytime sleepiness 02/11/2019   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[place]) 02/11/2019   Iron deficiency anemia 02/04/2019   Poor memory 12/16/2018   Deficiency anemia 11/19/2018   Other fatigue 11/19/2018   History of elevated PSA 11/19/2018   Prostate cancer screening 11/19/2018   Prostate CA ([place]) 11/19/2018   Pancytopenia, acquired ([place]) 08/13/2018   Recurrent falls 08/13/2018   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   Peripheral neuropathy 07/31/2018   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) 08/16/2010   IRREGULAR HEART RATE 08/16/2010   Sleep apnea 08/16/2010   BENIGN PROSTATIC HYPERTROPHY, HX OF 08/16/2010    Past Surgical History:  Procedure Laterality Date   AMPUTATION Left 05/17/2019   Procedure: AMPUTATION LEFT FOURTH TOE;  Surgeon: Meredith Pel, MD;  Location: [facility];  Service: Orthopedics;  Laterality: Left;   AMPUTATION TOE Right 02/06/2020   Procedure: AMPUTATION TOE;  Surgeon: Newt Minion, MD;  Location: [facility];  Service: Orthopedics;  Laterality: Right;   AMPUTATION TOE Right 08/17/2020   Procedure: AMPUTATION TOE 4th toe;  Surgeon: Trula Slade, DPM;  Location: [facility];  Service: Podiatry;  Laterality: Right;   APPENDECTOMY     CHOLECYSTECTOMY     CORONARY ARTERY BYPASS GRAFT     x3 (left internal mammary artery to distal left anterior descending coronary artery, saphenous vain graft to second circumflex marginal branch, saphenous vain graft to posterior descending  coronary artery, endoscopic saphenous vain harvest from right thigh) and modified Cox - Maze IV procedure.  [name],MD. Electronically signed CHO/MEDQ D: 01/09/2008/ JOB: [id number] cc:  [name]   CRANIOTOMY  07/30/2012   Procedure: CRANIOTOMY HEMATOMA EVACUATION SUBDURAL;  Surgeon: [name], MD;  Location: [facility];  Service: Neurosurgery;  Laterality: Right;  Right craniotomy for evacuation of subdural hematoma   FOOT SURGERY     HERNIA REPAIR     INTRAMEDULLARY (IM) NAIL INTERTROCHANTERIC Right 02/04/2020   Procedure: INTRAMEDULLARY (IM) NAIL INTERTROCHANTRIC;  Surgeon: Netta Cedars, MD;  Location: [facility];  Service: Orthopedics;  Laterality: Right;   ORCHIECTOMY     Left  /  testicular  cancer   PACEMAKER PLACEMENT     PPM - St. Jude   PPM GENERATOR CHANGEOUT N/A 06/25/2019   Procedure: PPM GENERATOR CHANGEOUT;  Surgeon: Evans Lance, MD;  Location: [facility];  Service: Cardiovascular;  Laterality: N/A;       Family History  Problem Relation Age of Onset   Heart disease Father    Heart attack Father    Heart failure Father    Heart disease Mother    Alzheimer's disease Mother    Dementia Mother     Social History   Tobacco Use   Smoking status: Former    Packs/day: 2.00    Years: 20.00    Pack years: 40.00    Types: Cigarettes    Quit date: 02/21/1991    Years since quitting: 30.7   Smokeless tobacco: Never  Vaping Use   Vaping Use: Never used  Substance Use Topics   Alcohol use: No    Alcohol/week: 0.0 standard drinks   Drug use: No    Home Medications Prior to Admission medications   Medication Sig Start Date End Date Taking? Authorizing Provider  cephALEXin (KEFLEX) 500 MG capsule Take 1 capsule (500 mg total) by mouth 4 (four) times daily for 7 days. 11/06/21 11/13/21 Yes , Tavaras Goody, DO  allopurinol (ZYLOPRIM) 300 MG tablet Take 1 tablet (300 mg total) by mouth daily. 02/24/20   Angiulli, Lavon Paganini, PA-C  colchicine 0.6 MG tablet TAKE 1 TO 3 TABLETS AS NEEDED DAILY    [provider]  ferrous sulfate 325 (65 FE) MG tablet Take 1 tablet (325 mg total) by mouth daily with breakfast. 02/24/20   Angiulli, Lavon Paganini, PA-C  memantine (NAMENDA) 10 MG tablet Take 10 mg by mouth 2 (two) times daily. 08/29/20   [provider]  metoprolol succinate (TOPROL-XL) 50 MG 24 hr tablet Take 1 tablet (50 mg total) by mouth daily. 10/26/21   [name], MD  mirabegron ER (MYRBETRIQ) 50 MG TB24 tablet Take 1 tablet (50 mg total) by mouth daily. 02/24/20   Angiulli, Lavon Paganini, PA-C  modafinil (PROVIGIL) 100 MG tablet Take 0.5 tablets (50 mg total) by mouth daily. 03/08/21   Ward Givens, NP  Multiple Vitamin (MULTI VITAMIN) TABS 1  tablet    [provider]  Multiple Vitamin (MULTIVITAMIN WITH MINERALS) TABS tablet Take 1 tablet by mouth daily. 02/10/20   [place] Shi, MD  pantoprazole (PROTONIX) 40 MG tablet Take 1 tablet (40 mg total) by mouth daily. 02/24/20   Angiulli, Lavon Paganini, PA-C  predniSONE (DELTASONE) 20 MG tablet Take 40 mg by mouth daily. 10/07/21   [provider]  rosuvastatin (CRESTOR) 40 MG tablet Take 1 tablet (40 mg total) by mouth daily. 02/24/20   King,  Lavon Paganini, PA-C  sertraline (ZOLOFT) 100 MG tablet Take 2 tablets (200 mg total) by mouth daily. 02/24/20   Angiulli, Lavon Paganini, PA-C  tamsulosin (FLOMAX) 0.4 MG CAPS capsule Take 1 capsule (0.4 mg total) by mouth at bedtime. 02/24/20   Angiulli, Lavon Paganini, PA-C  Teriparatide, Recombinant, 620 MCG/2.48ML SOPN inject 20 mcg Subcutaneous Once a day 04/11/21     torsemide 40 MG TABS Take 80 mg by mouth 2 (two) times daily. 11/03/21   [name], MD    Allergies    Aricept [donepezil], Baclofen, Lipitor [atorvastatin], Nsaids, Warfarin and related, and Enbrel [etanercept]  Review of Systems   Review of Systems  Constitutional:  Negative for activity change, appetite change, chills and fever.  HENT:  Negative for ear pain, nosebleeds and sore throat.   Eyes:  Negative for photophobia, pain and visual disturbance.  Respiratory:  Positive for shortness of breath. Negative for cough.   Cardiovascular:  Negative for chest pain, palpitations and leg swelling.  Gastrointestinal:  Positive for nausea and vomiting. Negative for abdominal distention and abdominal pain.  Genitourinary:  Negative for hematuria.  Musculoskeletal:  Negative for arthralgias, back pain, neck pain and neck stiffness.  Skin:  Positive for wound. Negative for color change, pallor and rash.  Neurological:  Negative for dizziness, tremors, seizures, syncope, speech difficulty, weakness, light-headedness, numbness and headaches.  Hematological:  Bruises/bleeds easily.   Psychiatric/Behavioral:  Negative for confusion. The patient is not nervous/anxious.   All other systems reviewed and are negative.  Physical Exam Updated Vital Signs BP 113/84   Pulse 81   Temp 98.4 F (36.9 C) (Oral)   Resp 13   Ht 5\' 8"  (1.727 m)   Wt 81.6 kg   SpO2 96%   BMI 27.37 kg/m   Physical Exam Vitals and nursing note reviewed.  Constitutional:      General: He is sleeping. He is not in acute distress.    Appearance: Normal appearance. He is well-developed. He is obese. He is not ill-appearing, toxic-appearing or diaphoretic.     Interventions: Cervical collar in place.  HENT:     Head: Normocephalic. Abrasion and right periorbital erythema present. No raccoon eyes, Battle's sign or left periorbital erythema.     Jaw: There is normal jaw occlusion.     Comments: Skin tear to right forehead, dry and older per pt. Small area of pus at wound site. Abrasion to nasal bridge, hemostatic    Right Ear: External ear normal.     Left Ear: External ear normal.     Nose: Signs of injury present. No nasal deformity, septal deviation, laceration, nasal tenderness or mucosal edema.     Right Nostril: No epistaxis or septal hematoma.     Left Nostril: No epistaxis or septal hematoma.     Comments: No midface instability    Mouth/Throat:     Lips: Pink.     Mouth: Mucous membranes are dry.     Pharynx: Oropharynx is clear.  Eyes:     General: Vision grossly intact. No scleral icterus.    Extraocular Movements: Extraocular movements intact.     Conjunctiva/sclera: Conjunctivae normal.     Pupils: Pupils are equal, round, and reactive to light.     Comments: Pupils 2 mm  Neck:     Vascular: No JVD.     Trachea: Trachea and phonation normal.  Cardiovascular:     Rate and Rhythm: Normal rate. Rhythm irregular.     Pulses: Normal  pulses.     Comments: Chronic appearing BLE edema and stasis dermatitis Pulmonary:     Effort: Pulmonary effort is normal. No respiratory distress.      Breath sounds: Normal breath sounds and air entry. No stridor. No wheezing, rhonchi or rales.  Chest:     Chest wall: No tenderness.  Abdominal:     General: Abdomen is flat. Bowel sounds are normal. There is no distension.     Palpations: Abdomen is soft.     Tenderness: There is no abdominal tenderness. There is no guarding or rebound.  Musculoskeletal:        General: Normal range of motion.     Right shoulder: Tenderness present. No bony tenderness. Normal range of motion.     Left shoulder: Normal. No bony tenderness. Normal range of motion.     Cervical back: Normal, full passive range of motion without pain, normal range of motion and neck supple. No rigidity, tenderness or bony tenderness. No spinous process tenderness or muscular tenderness.     Thoracic back: Normal. No tenderness or bony tenderness.     Lumbar back: Normal. No tenderness or bony tenderness.     Right lower leg: 2+ Pitting Edema present.     Left lower leg: 2+ Pitting Edema present.  Lymphadenopathy:     Cervical: No cervical adenopathy.  Skin:    General: Skin is warm and dry.     Capillary Refill: Capillary refill takes less than 2 seconds.     Comments: Stasis dermatitis of distal BLE.  Multiple skin tears to all extremities, hemostatic and without overlying signs of infection.  Multiple different aged appearing bruises on all extremities. Small erythema to medial left knee  Neurological:     General: No focal deficit present.     Mental Status: He is oriented to person, place, and time and easily aroused. Mental status is at baseline.     GCS: GCS eye subscore is 4. GCS verbal subscore is 5. GCS motor subscore is 6.     Cranial Nerves: Cranial nerves 2-12 are intact. No cranial nerve deficit, dysarthria or facial asymmetry.     Sensory: Sensation is intact. No sensory deficit.     Motor: Motor function is intact. No weakness, tremor, atrophy, abnormal muscle tone or seizure activity.     Coordination:  Coordination is intact. Coordination normal. Finger-Nose-Finger Test normal.     Comments: Ambulates with walker at baseline  Psychiatric:        Mood and Affect: Mood normal.        Behavior: Behavior normal. Behavior is cooperative.    ED Results / Procedures / Treatments   Labs (all labs ordered are listed, but only abnormal results are displayed) Labs Reviewed  CBC WITH DIFFERENTIAL/PLATELET - Abnormal; Notable for the following components:      Result Value   RBC 3.33 (*)    Hemoglobin 9.7 (*)    HCT 30.9 (*)    RDW 16.7 (*)    Platelets 111 (*)    Lymphs Abs 0.5 (*)    All other components within normal limits  COMPREHENSIVE METABOLIC PANEL - Abnormal; Notable for the following components:   Glucose, Bld 100 (*)    BUN 27 (*)    Creatinine, Ser 1.78 (*)    Albumin 3.4 (*)    GFR, Estimated 39 (*)    All other components within normal limits  PROTIME-INR - Abnormal; Notable for the following components:   Prothrombin Time  17.1 (*)    INR 1.4 (*)    All other components within normal limits  RESP PANEL BY RT-PCR (FLU A&B, COVID) ARPGX2  APTT    EKG EKG Interpretation  Date/Time:  Sunday November 06 2021 15:57:06 EST Ventricular Rate:  95 PR Interval:    QRS Duration: 149 QT Interval:  404 QTC Calculation: 508 R Axis:   44 Text Interpretation: Atrial flutter Right bundle branch block Since last tracing rate slower Confirmed by Dorie Rank [phone]) on 11/06/2021 5:20:35 PM  Radiology DG Pelvis 1-2 Views  Result Date: 11/06/2021 CLINICAL DATA:  Bilateral leg pain, prior hip surgery EXAM: PELVIS - 1-2 VIEW COMPARISON:  02/04/2020 FINDINGS: Frontal view of the pelvis and a frontal view of the right hip are obtained. Intramedullary rod with proximal dynamic and distal interlocking screw identified within the proximal right femur. No acute fracture, subluxation, or dislocation. There is mild bilateral hip osteoarthritis, right greater than left. Sacroiliac joints are  unremarkable. Stable lower lumbar degenerative changes. IMPRESSION: 1. ORIF of a previous intertrochanteric right hip fracture. 2. No acute bony abnormality. 3. Bilateral hip osteoarthritis, right greater than left. Electronically Signed   By: Randa Ngo M.D.   On: 11/06/2021 18:05   DG Shoulder Right  Result Date: 11/06/2021 CLINICAL DATA:  Anterior superior shoulder pain, fell EXAM: RIGHT SHOULDER - 2+ VIEW COMPARISON:  None. FINDINGS: Internal rotation, external rotation, and transscapular views of the right shoulder are obtained. No fracture, subluxation, or dislocation. Mild acromioclavicular and glenohumeral joint osteoarthritis. Visualized portions of the right chest are clear. IMPRESSION: 1. Mild osteoarthritis.  No acute displaced fracture. Electronically Signed   By: Randa Ngo M.D.   On: 11/06/2021 18:09   CT HEAD WO CONTRAST (5MM)  Result Date: 11/06/2021 CLINICAL DATA:  [place], facial laceration EXAM: CT HEAD WITHOUT CONTRAST TECHNIQUE: Contiguous axial images were obtained from the base of the skull through the vertex without intravenous contrast. COMPARISON:  10/19/2021 FINDINGS: Brain: No acute infarct or hemorrhage. Lateral ventricles and midline structures are unremarkable. Focal hypodensity right basal ganglia unchanged, consistent with chronic lacunar infarct or dilated perivascular space. No acute extra-axial fluid collections. No mass effect. Vascular: No hyperdense vessel or unexpected calcification. Skull: Postsurgical changes from prior right parietal craniotomy. No acute bony abnormalities. Sinuses/Orbits: Mild mucosal thickening within the maxillary sinuses, right greater than left. Remaining sinuses are clear. Other: None. IMPRESSION: 1. No acute intracranial process.  Stable exam. Electronically Signed   By: Randa Ngo M.D.   On: 11/06/2021 17:45   CT Cervical Spine Wo Contrast  Result Date: 11/06/2021 CLINICAL DATA:  [place], facial laceration EXAM: CT CERVICAL  SPINE WITHOUT CONTRAST TECHNIQUE: Multidetector CT imaging of the cervical spine was performed without intravenous contrast. Multiplanar CT image reconstructions were also generated. COMPARISON:  02/04/2020 FINDINGS: Alignment: Alignment remains grossly anatomic. Skull base and vertebrae: No acute fracture. No primary bone lesion or focal pathologic process. Soft tissues and spinal canal: No prevertebral fluid or swelling. No visible canal hematoma. Disc levels: There is likely congenital partial fusion across the C5-6 disc space. Prominent spondylosis at C3-4, C4-5, and C6-7 again noted and unchanged. Central disc protrusion at C3-4 is stable. Upper chest: Airway is patent. Trace right pleural effusion. Biapical pleural and parenchymal lung scarring. Other: Reconstructed images demonstrate no additional findings. IMPRESSION: 1. No acute cervical spine fracture. 2. Extensive multilevel cervical spondylosis and facet hypertrophy, stable. Electronically Signed   By: Randa Ngo M.D.   On: 11/06/2021 17:48   DG  Chest Portable 1 View  Result Date: 11/06/2021 CLINICAL DATA:  [place], short of breath EXAM: PORTABLE CHEST 1 VIEW COMPARISON:  12/07/2020 FINDINGS: Single frontal view of the chest demonstrates stable dual lead pacer. Cardiac silhouette is enlarged. There is chronic central vascular congestion with background parenchymal lung scarring. No acute airspace disease, effusion, or pneumothorax. No acute bony abnormality. IMPRESSION: 1. Chronic vascular congestion superimposed upon background parenchymal lung scarring. No acute airspace disease. Electronically Signed   By: Randa Ngo M.D.   On: 11/06/2021 17:14    Procedures Procedures   Medications Ordered in ED Medications - No data to display  ED Course  I have reviewed the triage vital signs and the nursing notes.  Pertinent labs & imaging results that were available during my care of the patient were reviewed by me and considered in my  medical decision making (see chart for details).    MDM Rules/Calculators/A&P                          Trenell Cormac Wint. is a 80 y.o. male who presented via EMS as an unleveled trauma s/p GLF.  Upon arrival of the patient, EMS and patient provided pertinent history and exam findings. ABCs intact as exam above during primary exam. Once PIV was established, the secondary exam was performed. Pertinent exam findings include multiple skin tears as above. eFAST exam was not performed. Sent for imaging with results as above.  Pertinent labs: Normocytic anemia within patient's prior baseline range, CBC otherwise unremarkable. CMP with creatinine elevated at prior baseline.  PT/INR elevated.  COVID/flu negative.  Medications: Medications - No data to display   Significant findings include skin tears without other traumatic findings. Based on the patient's clinical picture, trauma surgery was not consulted. On re-evaluation, patient is sleeping comfortably in no acute distress. Wounds dressed with bacitracin and nonstick dressings.  Low suspicion for physiologic etiology of falls, falls appear chronic and related to mechanical source.  Patient does state that he has had some erythema of the left medial knee.  Small area appears consistent with stasis dermatitis with potential developing cellulitis.  Patient has no systemic infectious signs or symptoms and no leukocytosis, low concern for DVT or significant cellulitis at this time.  We will treat based on patient and wife's concern about progressive redness.  C-collar cleared by other provider.  Patient ambulated with walker as per baseline.  Hemodynamically stable at the time of discharge.  Discharged home in stable condition.  Close outpatient PCP follow-up advised.  Rx Keflex x7 days.  Supportive care discussed. Strict ED return precautions advised. Patient understands and agrees to the plan.   Labs and imaging reviewed and considered in medical  decision making if ordered.  Imaging interpreted by radiology and personally by me. The plan for this patient was discussed with my attending physician, who voiced agreement and who oversaw evaluation and treatment of this patient.    Note: Estate manager/land agent was used in the creation of this note.  Final Clinical Impression(s) / ED Diagnoses Final diagnoses:  Fall, initial encounter  Cellulitis of left lower extremity    Rx / DC Orders ED Discharge Orders          Ordered    cephALEXin (KEFLEX) 500 MG capsule  4 times daily        11/06/21 2013             [place], Lawrence, DO 11/07/21  2103    Dorie Rank, MD 11/08/21 [phone]

## 2021-11-07 DIAGNOSIS — M810 Age-related osteoporosis without current pathological fracture: Secondary | ICD-10-CM | POA: Diagnosis not present

## 2021-11-08 ENCOUNTER — Other Ambulatory Visit: Payer: Self-pay

## 2021-11-08 ENCOUNTER — Encounter: Payer: Self-pay | Admitting: Internal Medicine

## 2021-11-08 ENCOUNTER — Ambulatory Visit (INDEPENDENT_AMBULATORY_CARE_PROVIDER_SITE_OTHER): Payer: Medicare Other | Admitting: Internal Medicine

## 2021-11-08 VITALS — BP 122/78 | HR 114 | Ht 68.0 in | Wt 222.6 lb

## 2021-11-08 DIAGNOSIS — R001 Bradycardia, unspecified: Secondary | ICD-10-CM

## 2021-11-08 DIAGNOSIS — I2510 Atherosclerotic heart disease of native coronary artery without angina pectoris: Secondary | ICD-10-CM

## 2021-11-08 DIAGNOSIS — Z23 Encounter for immunization: Secondary | ICD-10-CM

## 2021-11-08 LAB — CUP PACEART INCLINIC DEVICE CHECK
Battery Remaining Longevity: 98 mo
Battery Voltage: 3.01 V
Brady Statistic RA Percent Paced: 0 %
Brady Statistic RV Percent Paced: 0.22 %
Date Time Interrogation Session: 20221122170342
Implantable Lead Implant Date: 20090420
Implantable Lead Implant Date: 20090420
Implantable Lead Location: 753859
Implantable Lead Location: 753860
Implantable Pulse Generator Implant Date: 20200708
Lead Channel Impedance Value: 437.5 Ohm
Lead Channel Impedance Value: 512.5 Ohm
Lead Channel Pacing Threshold Amplitude: 0.625 V
Lead Channel Pacing Threshold Pulse Width: 0.4 ms
Lead Channel Sensing Intrinsic Amplitude: 2 mV
Lead Channel Sensing Intrinsic Amplitude: 6.4 mV
Lead Channel Setting Pacing Amplitude: 2 V
Lead Channel Setting Pacing Amplitude: 2.5 V
Lead Channel Setting Pacing Pulse Width: 0.4 ms
Lead Channel Setting Sensing Sensitivity: 2 mV
Pulse Gen Model: 2272
Pulse Gen Serial Number: 9149180

## 2021-11-08 MED ORDER — METOPROLOL SUCCINATE ER 50 MG PO TB24: 50.0000 mg | ORAL_TABLET | Freq: Two times a day (BID) | ORAL | 3 refills | Status: DC

## 2021-11-08 NOTE — Progress Notes (Signed)
HPI [name] returns today for followup. He is a pleasant 80 yo man with chronic atrial fib, severe TR, chronic peripheral edem and CAD. He has had some compliance problems with both his meds and diet. He denies eating too much salt but he eats out all of the time. He has not had syncope. He has class 3 dyspnea. He also has chronic lung problems.  Allergies  Allergen Reactions   Aricept [Donepezil] Other (See Comments)    Hallucination    Baclofen Itching   Lipitor [Atorvastatin] Other (See Comments)    Stiff joints   Nsaids Other (See Comments)    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   Warfarin And Related Other (See Comments)    Stiff joints   Enbrel [Etanercept] Itching     Current Outpatient Medications  Medication Sig Dispense Refill   allopurinol (ZYLOPRIM) 300 MG tablet Take 1 tablet (300 mg total) by mouth daily. 30 tablet 0   cephALEXin (KEFLEX) 500 MG capsule Take 1 capsule (500 mg total) by mouth 4 (four) times daily for 7 days. 28 capsule 0   colchicine 0.6 MG tablet TAKE 1 TO 3 TABLETS AS NEEDED DAILY     ferrous sulfate 325 (65 FE) MG tablet Take 1 tablet (325 mg total) by mouth daily with breakfast. 30 tablet 3   memantine (NAMENDA) 10 MG tablet Take 10 mg by mouth 2 (two) times daily.     metoprolol succinate (TOPROL-XL) 50 MG 24 hr tablet Take 1 tablet (50 mg total) by mouth daily. 90 tablet 3   mirabegron ER (MYRBETRIQ) 50 MG TB24 tablet Take 1 tablet (50 mg total) by mouth daily. 30 tablet 3   modafinil (PROVIGIL) 100 MG tablet Take 0.5 tablets (50 mg total) by mouth daily. 30 tablet 5   Multiple Vitamin (MULTI VITAMIN) TABS 1 tablet     Multiple Vitamin (MULTIVITAMIN WITH MINERALS) TABS tablet Take 1 tablet by mouth daily.     pantoprazole (PROTONIX) 40 MG tablet Take 1 tablet (40 mg total) by mouth daily. 30 tablet 0   rosuvastatin (CRESTOR) 40 MG tablet Take 1 tablet (40 mg total) by mouth daily. 90 tablet 2   sertraline (ZOLOFT) 100 MG tablet Take 2 tablets  (200 mg total) by mouth daily. 30 tablet 0   tamsulosin (FLOMAX) 0.4 MG CAPS capsule Take 1 capsule (0.4 mg total) by mouth at bedtime. 30 capsule 1   Teriparatide, Recombinant, 620 MCG/2.48ML SOPN inject 20 mcg Subcutaneous Once a day 1 mL 11   torsemide 40 MG TABS Take 80 mg by mouth 2 (two) times daily. 360 tablet 3   predniSONE (DELTASONE) 20 MG tablet Take 40 mg by mouth daily. (Patient not taking: Reported on 11/08/2021)     No current facility-administered medications for this visit.     Past Medical History:  Diagnosis Date   (HFpEF) heart failure with preserved ejection fraction ([place])    a. 05/2013 Echo: EF 55%, mild LVH, diast dysfxn, Ao sclerosis, mildly dil LA, RV dysfxn (poorly visualized), PASP 33mmHg;  b. 03/2017 Echo: EF 55-60%, no rwma, triv MR, mildly dil RV, mod TR, PASP 26mmHg.   Atrial fibrillation [facility])    s/p Cox Maze 1/09;  Multaq Rx d/c'd in 2014 due to pulmo fibrosis;  coumadin d/c'd in 2014 due to spontaneous subdural hematoma   BPH (benign prostatic hyperplasia)    CAD (coronary artery disease), native coronary artery    a. s/p CABG 12/2007;  b. Myoview 12/2011: EF 66%, no scar or ischemia; normal.   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 Jude   Peripheral neuropathy 07/31/2018   Pulmonary fibrosis ([place])    Multaq d/c'd 7/14   Subdural hematoma 07/2012   spontaneous;  coumadin d/c'd => no longer a candidate for anticoagulation    ROS:   All systems reviewed and negative except as noted in the HPI.   Past Surgical History:  Procedure Laterality Date   AMPUTATION Left 05/17/2019   Procedure: AMPUTATION LEFT FOURTH TOE;  Surgeon: Meredith Pel, [name];  Location: [place];  Service: Orthopedics;  Laterality: Left;   AMPUTATION TOE Right 02/06/2020   Procedure: AMPUTATION TOE;  Surgeon: Newt Minion, [name];  Location: [place];  Service: Orthopedics;  Laterality: Right;   AMPUTATION TOE Right 08/17/2020   Procedure: AMPUTATION TOE 4th toe;  Surgeon: Trula Slade, DPM;  Location: [facility];  Service: Podiatry;  Laterality: Right;   APPENDECTOMY     CHOLECYSTECTOMY     CORONARY ARTERY BYPASS GRAFT     x3 (left internal mammary artery to distal left anterior descending coronary artery, saphenous vain graft to second circumflex marginal branch, saphenous vain graft to posterior descending coronary artery, endoscopic saphenous vain harvest from right thigh) and modified Cox - Maze IV procedure.  Jamie Gu. [name],[name]. Electronically signed CHO/MEDQ D: 01/09/2008/ JOB: [id number] cc:  [name] [name]   CRANIOTOMY  07/30/2012   Procedure: CRANIOTOMY HEMATOMA EVACUATION SUBDURAL;  Surgeon: [name], [name];  Location: [place] [facility];  Service: Neurosurgery;  Laterality: Right;  Right craniotomy for evacuation of subdural hematoma   FOOT SURGERY     HERNIA REPAIR     INTRAMEDULLARY (IM) NAIL INTERTROCHANTERIC Right 02/04/2020   Procedure: INTRAMEDULLARY (IM) NAIL INTERTROCHANTRIC;  Surgeon: Netta Cedars, [name];  Location: [place];  Service: Orthopedics;  Laterality: Right;   ORCHIECTOMY     Left  /  testicular cancer   PACEMAKER PLACEMENT     PPM - St. Jude   PPM GENERATOR CHANGEOUT N/A 06/25/2019   Procedure: PPM GENERATOR CHANGEOUT;  Surgeon: Evans Lance, [name];  Location: [place] [facility];  Service: Cardiovascular;  Laterality: N/A;     Family History  Problem Relation Age of Onset   Heart disease Father    Heart attack Father    Heart failure Father    Heart disease Mother    Alzheimer's disease Mother    Dementia Mother      Social History   Socioeconomic History   Marital status: Married    Spouse name: Immunologist   Number of children: 2   Years of education: Not on file   Highest education level: Not on file  Occupational History   Occupation: Retired- IT trainer  Tobacco Use    Smoking status: Former    Packs/day: 2.00    Years: 20.00    Pack years: 40.00    Types: Cigarettes    Quit date: 02/21/1991    Years since quitting: 30.7   Smokeless tobacco: Never  Vaping Use   Vaping Use: Never used  Substance and Sexual Activity   Alcohol use: No    Alcohol/week: 0.0 standard drinks   Drug use: No   Sexual activity: Not Currently  Other Topics Concern   Not on file  Social History Narrative   Lives with wife   Right handed    Married with two children.     He is a Engineer, structural.     Social Determinants of Health   Financial Resource Strain: Not on file  Food Insecurity: Not on file  Transportation Needs: Not on file  Physical Activity: Not on file  Stress: Not on file  Social Connections: Not on file  Intimate Partner Violence: Not on file     BP 122/78   Pulse (!) 114   Ht 5\' 8"  (1.727 m)   Wt 222 lb 9.6 oz (101 kg)   SpO2 96%   BMI 33.85 kg/m   Physical Exam:  Chronically ill appearing NAD HEENT: Unremarkable Neck:  9 cm JVD, no thyromegally Lymphatics:  No adenopathy Back:  No CVA tenderness Lungs:  Clear with rales in the bases. HEART:  IRegular tachy rhythm, no murmurs, no rubs, no clicks Abd:  soft, positive bowel sounds, no organomegally, no rebound, no guarding Ext:  2 plus pulses, no edema, no cyanosis, no clubbing Skin:  No rashes no nodules Neuro:  CN II through XII intact, motor grossly intact  EKG - none  DEVICE  Normal device function.  See PaceArt for details. Uncontrolled atrial fib  Assess/Plan:  Uncontrolled atrial fib - I think that this is his biggest problem. I have asked him to increase the toprol to 50 mg twice daily. I will see him back in 3 months. He will return in 2 weeks for an ecg. I would consider adding digoxin if the increased rate is not better. CAD - he denies anginal symtpoms.  Diastolic heart failure - his symptoms are class 3. He will continue his diuretic and hopefully rate control will help  him. Severe TR - this is part of the problem. I suspect that his PPM is contributing.  Jamie Overlie Takima [place],[name]

## 2021-11-08 NOTE — Patient Instructions (Signed)
Medication Instructions:   Increase Toprol XL to 50 mg Two Times Daily   *If you need a refill on your cardiac medications before your next appointment, please call your pharmacy*   Lab Work: NONE   If you have labs (blood work) drawn today and your tests are completely normal, you will receive your results only by: [place] (if you have MyChart) OR A paper copy in the mail If you have any lab test that is abnormal or we need to change your treatment, we will call you to review the results.   Testing/Procedures: NONE    Follow-Up: At [facility], you and your health needs are our priority.  As part of our continuing mission to provide you with exceptional heart care, we have created designated Provider Care Teams.  These Care Teams include your primary Cardiologist (physician) and Advanced Practice Providers (APPs -  Physician Assistants and Nurse Practitioners) who all work together to provide you with the care you need, when you need it.  We recommend signing up for the patient portal called "MyChart".  Sign up information is provided on this After Visit Summary.  MyChart is used to connect with patients for Virtual Visits (Telemedicine).  Patients are able to view lab/test results, encounter notes, upcoming appointments, etc.  Non-urgent messages can be sent to your provider as well.   To learn more about what you can do with MyChart, go to [email or web address].    Your next appointment:   3 month(s)  The format for your next appointment:   In Person  Provider:   Cristopher Peru, MD    Other Instructions Your physician recommends that you schedule a follow-up appointment in: 2 Weeks for EKG   Thank you for choosing [facility]!

## 2021-11-16 DIAGNOSIS — R9389 Abnormal findings on diagnostic imaging of other specified body structures: Secondary | ICD-10-CM | POA: Diagnosis not present

## 2021-11-16 DIAGNOSIS — R441 Visual hallucinations: Secondary | ICD-10-CM | POA: Diagnosis not present

## 2021-11-16 DIAGNOSIS — R600 Localized edema: Secondary | ICD-10-CM | POA: Diagnosis not present

## 2021-11-16 DIAGNOSIS — R2689 Other abnormalities of gait and mobility: Secondary | ICD-10-CM | POA: Diagnosis not present

## 2021-11-21 NOTE — Progress Notes (Signed)
Cardiology Clinic Note   Patient Name: [name]. Date of Encounter: 11/22/2021  Primary Care Provider:  [name], MD Primary Cardiologist:  Kirk Ruths, MD  Patient Profile    [name]. presents to the clinic today for follow-up evaluation of his paroxysmal atrial fibrillation and chronic right-sided heart failure.  Past Medical History    Past Medical History:  Diagnosis Date   (HFpEF) heart failure with preserved ejection fraction ([place])    a. 05/2013 Echo: EF 55%, mild LVH, diast dysfxn, Ao sclerosis, mildly dil LA, RV dysfxn (poorly visualized), PASP 43mmHg;  b. 03/2017 Echo: EF 55-60%, no rwma, triv MR, mildly dil RV, mod TR, PASP 70mmHg.   Atrial fibrillation ([place])    s/p Cox Maze 1/09;  Multaq Rx d/c'd in 2014 due to pulmo fibrosis;  coumadin d/c'd in 2014 due to spontaneous subdural hematoma   BPH (benign prostatic hyperplasia)    CAD (coronary artery disease), native coronary artery    a. s/p CABG 12/2007;  b. Myoview 12/2011: EF 66%, no scar or ischemia; normal.   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 Jude   Peripheral neuropathy 07/31/2018   Pulmonary fibrosis ([place])    Multaq d/c'd 7/14   Subdural hematoma 07/2012   spontaneous;  coumadin d/c'd => no longer a candidate for anticoagulation   Past Surgical History:  Procedure Laterality Date   AMPUTATION Left 05/17/2019   Procedure: AMPUTATION LEFT FOURTH TOE;  Surgeon: Meredith Pel, MD;  Location: [facility];  Service: Orthopedics;  Laterality: Left;   AMPUTATION TOE Right 02/06/2020   Procedure: AMPUTATION TOE;  Surgeon: Newt Minion, MD;  Location: [facility];  Service: Orthopedics;  Laterality: Right;   AMPUTATION TOE Right 08/17/2020   Procedure: AMPUTATION TOE 4th toe;  Surgeon: Trula Slade, DPM;  Location: [facility];   Service: Podiatry;  Laterality: Right;   APPENDECTOMY     CHOLECYSTECTOMY     CORONARY ARTERY BYPASS GRAFT     x3 (left internal mammary artery to distal left anterior descending coronary artery, saphenous vain graft to second circumflex marginal branch, saphenous vain graft to posterior descending coronary artery, endoscopic saphenous vain harvest from right thigh) and modified Cox - Maze IV procedure.  [name],MD. Electronically signed CHO/MEDQ D: 01/09/2008/ JOB: [id number] cc:  [name]   CRANIOTOMY  07/30/2012   Procedure: CRANIOTOMY HEMATOMA EVACUATION SUBDURAL;  Surgeon: [name], MD;  Location: [facility];  Service: Neurosurgery;  Laterality: Right;  Right craniotomy for evacuation of subdural hematoma   FOOT SURGERY     HERNIA REPAIR     INTRAMEDULLARY (IM) NAIL INTERTROCHANTERIC Right 02/04/2020   Procedure: INTRAMEDULLARY (IM) NAIL INTERTROCHANTRIC;  Surgeon: Netta Cedars, MD;  Location: [facility];  Service: Orthopedics;  Laterality: Right;   ORCHIECTOMY     Left  /  testicular cancer   PACEMAKER PLACEMENT     PPM - St. Jude   PPM GENERATOR CHANGEOUT N/A 06/25/2019   Procedure: PPM GENERATOR CHANGEOUT;  Surgeon: Evans Lance, MD;  Location: [facility];  Service: Cardiovascular;  Laterality: N/A;    Allergies  Allergies  Allergen Reactions   Aricept [Donepezil] Other (See Comments)    Hallucination    Baclofen Itching  Lipitor [Atorvastatin] Other (See Comments)    Stiff joints   Nsaids Other (See Comments)    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   Warfarin And Related Other (See Comments)    Stiff joints   Enbrel [Etanercept] Itching    History of Present Illness    Jamie G Vanderpool Jr. has a PMH of coronary artery disease and atrial fibrillation.  Coronary artery bypass graft LIMA-LAD, SVG-L2, SVG-PDA as well as modified Cox-Maze procedure 1/09.  Patient) patient presents today to symptomatic bradycardia.  He underwent abdominal ultrasound on 3/12 which  showed no aneurysm.  He suffered spontaneous subdural hematoma/13.  He has chronic cognitive and evacuation by Dr. Saintclair Halsted. He was taken off his Coumadin and no longer felt to be a appropriate candidate.  His Multaq was DC'd previously due to pulmonary toxicity.  His nuclear stress test 5/18 showed an EF of 59% with normal perfusion.  Echocardiogram 5/20 showed EF of 50-55%, moderate ventricular enlargement, mildly reduced RV function, severe tricuspid regurgitation.  His ABIs 6/20 was normal.  His carotid Doppler 6/20 showed 1-39% bilateral stenosis.  He had traits cranial Doppler 6/20 which were negative.  His chest CT 8/22 showed pulmonary fibrosis, probable cirrhosis, enlarged 2010 enlarged pulmonary trunk suggestive of pulmonary hypertension.  He was seen August 2022 with recurrent atrial fibrillation by Dr. Lovena Le.  His metoprolol was increased at that time.  He was seen by Dr. [name] on 10/11/2021.  During that time he was noted to have worsening bilateral lower extremity swelling.  He did have some increased dyspnea on exertion.  He also noted fatigue.  His Demadex was increased as an outpatient as he noted some improvement but his symptoms persisted.  He denied chest pain, palpitations, and syncope.   He contacted the nurse triage line on 11/02/21.  With complaints of having increased lower extremity swelling.  Patient diuresis was decreased.  His BNP remains stable.  He followed up with Dr. Lovena Le on 2222.  During that time he was noted to be NYHA class III.  His device was functioning normally.  He presents to the clinic today for follow-up evaluation states he has not lost any weight and his legs are still swollen.  He presents to the clinic with his wife.  She reports that she is unable to get lower extremity support stockings on his lower extremities.  He has been sleeping more.  He reports that he is able to lie flat on his back.  He has noticed some accelerated heart rates and feels that his  urination increased and now has plateaued.  We discussed the importance of low-salt diet, fluid restriction, daily weights, and contacting the office with rapid weight increases.  I will continue his torsemide at the increased dose of 160 mg in the a.m. and 160 mg at noon.  We will start Zaroxolyn Wednesday and Friday.  I will start him on a 48 ounce fluid restriction.  We will have him return to the office next week for BMP and follow-up.  Today he denies chest pain, increased shortness of breath,  fatigue, palpitations, melena, hematuria, hemoptysis, diaphoresis, weakness, presyncope, syncope, orthopnea, and PND.   Home Medications    Prior to Admission medications   Medication Sig Start Date End Date Taking? Authorizing Provider  allopurinol (ZYLOPRIM) 300 MG tablet Take 1 tablet (300 mg total) by mouth daily. 02/24/20   Angiulli, Lavon Paganini, PA-C  colchicine 0.6 MG tablet TAKE 1 TO 3 TABLETS AS NEEDED DAILY  [provider]  ferrous sulfate 325 (65 FE) MG tablet Take 1 tablet (325 mg total) by mouth daily with breakfast. 02/24/20   Angiulli, Lavon Paganini, PA-C  memantine (NAMENDA) 10 MG tablet Take 10 mg by mouth 2 (two) times daily. 08/29/20   [provider]  metoprolol succinate (TOPROL-XL) 50 MG 24 hr tablet Take 1 tablet (50 mg total) by mouth 2 (two) times daily. Take with or immediately following a meal. 11/08/21 02/06/22  Evans Lance, MD  mirabegron ER (MYRBETRIQ) 50 MG TB24 tablet Take 1 tablet (50 mg total) by mouth daily. 02/24/20   Angiulli, Lavon Paganini, PA-C  modafinil (PROVIGIL) 100 MG tablet Take 0.5 tablets (50 mg total) by mouth daily. 03/08/21   Ward Givens, NP  Multiple Vitamin (MULTI VITAMIN) TABS 1 tablet    [provider]  Multiple Vitamin (MULTIVITAMIN WITH MINERALS) TABS tablet Take 1 tablet by mouth daily. 02/10/20   [place] Shi, MD  pantoprazole (PROTONIX) 40 MG tablet Take 1 tablet (40 mg total) by mouth daily. 02/24/20   Angiulli, Lavon Paganini,  PA-C  predniSONE (DELTASONE) 20 MG tablet Take 40 mg by mouth daily. Patient not taking: Reported on 11/08/2021 10/07/21   [provider]  rosuvastatin (CRESTOR) 40 MG tablet Take 1 tablet (40 mg total) by mouth daily. 02/24/20   Angiulli, Lavon Paganini, PA-C  sertraline (ZOLOFT) 100 MG tablet Take 2 tablets (200 mg total) by mouth daily. 02/24/20   Angiulli, Lavon Paganini, PA-C  tamsulosin (FLOMAX) 0.4 MG CAPS capsule Take 1 capsule (0.4 mg total) by mouth at bedtime. 02/24/20   Angiulli, Lavon Paganini, PA-C  Teriparatide, Recombinant, 620 MCG/2.48ML SOPN inject 20 mcg Subcutaneous Once a day 04/11/21     torsemide 40 MG TABS Take 80 mg by mouth 2 (two) times daily. 11/03/21   [name], MD    Family History    Family History  Problem Relation Age of Onset   Heart disease Father    Heart attack Father    Heart failure Father    Heart disease Mother    Alzheimer's disease Mother    Dementia Mother    He indicated that his mother is deceased. He indicated that his father is deceased. He indicated that his sister is alive. He indicated that his maternal grandmother is deceased. He indicated that his maternal grandfather is deceased. He indicated that his paternal grandmother is deceased. He indicated that his paternal grandfather is deceased.  Social History    Social History   Socioeconomic History   Marital status: Married    Spouse name: Immunologist   Number of children: 2   Years of education: Not on file   Highest education level: Not on file  Occupational History   Occupation: Retired- IT trainer  Tobacco Use   Smoking status: Former    Packs/day: 2.00    Years: 20.00    Pack years: 40.00    Types: Cigarettes    Quit date: 02/21/1991    Years since quitting: 30.7   Smokeless tobacco: Never  Vaping Use   Vaping Use: Never used  Substance and Sexual Activity   Alcohol use: No    Alcohol/week: 0.0 standard drinks   Drug use: No   Sexual activity: Not Currently  Other Topics  Concern   Not on file  Social History Narrative   Lives with wife   Right handed    Married with two children.     He is a Engineer, structural.  Social Determinants of Health   Financial Resource Strain: Not on file  Food Insecurity: Not on file  Transportation Needs: Not on file  Physical Activity: Not on file  Stress: Not on file  Social Connections: Not on file  Intimate Partner Violence: Not on file     Review of Systems    General:  No chills, fever, night sweats or weight changes.  Cardiovascular:  No chest pain, dyspnea on exertion, edema, orthopnea, palpitations, paroxysmal nocturnal dyspnea. Dermatological: No rash, lesions/masses Respiratory: No cough, dyspnea Urologic: No hematuria, dysuria Abdominal:   No nausea, vomiting, diarrhea, bright red blood per rectum, melena, or hematemesis Neurologic:  No visual changes, wkns, changes in mental status. All other systems reviewed and are otherwise negative except as noted above.  Physical Exam    VS:  BP 125/74   Pulse 85   Ht 5\' 8"  (1.727 m)   Wt 222 lb 3.2 oz (100.8 kg)   SpO2 97%   BMI 33.79 kg/m  , BMI Body mass index is 33.79 kg/m. GEN: Well nourished, well developed, in no acute distress. HEENT: normal. Neck: Supple, no JVD, carotid bruits, or masses. Cardiac: RRR, no murmurs, rubs, or gallops. No clubbing, cyanosis, +2 pitting bilateral lower extremity edema to top of his knee.  Radials/DP/PT 2+ and equal bilaterally.  Respiratory:  Respirations regular and unlabored, clear to auscultation bilaterally. GI: Soft, nontender, nondistended, BS + x 4. MS: no deformity or atrophy. Skin: warm and dry, no rash. Neuro:  Strength and sensation are intact. Psych: Normal affect.  Accessory Clinical Findings    Recent Labs: 10/11/2021: NT-Pro BNP 1,495 11/06/2021: ALT 16; BUN 27; Creatinine, Ser 1.78; Hemoglobin 9.7; Platelets 111; Potassium 3.7; Sodium 139   Recent Lipid Panel    Component Value Date/Time    CHOL 103 05/22/2019 0338   CHOL 119 09/24/2017 0824   TRIG 58 05/22/2019 0338   HDL 40 (L) 05/22/2019 0338   HDL 42 09/24/2017 0824   CHOLHDL 2.6 05/22/2019 0338   VLDL 12 05/22/2019 0338   LDLCALC 51 05/22/2019 0338   LDLCALC 58 09/24/2017 0824   LDLDIRECT 154.2 10/20/2013 0846    ECG personally reviewed by me today-none today.  Echocardiogram on 11/02/2021 IMPRESSIONS     1. Left ventricular ejection fraction, by estimation, is 55 to 60%. The  left ventricle has normal function. The left ventricle has no regional  wall motion abnormalities. Left ventricular diastolic function could not  be evaluated. There is the  interventricular septum is flattened in diastole ('D' shaped left  ventricle), consistent with right ventricular volume overload.   2. Right ventricular systolic function is moderately reduced. The right  ventricular size is moderately enlarged. There is moderately elevated  pulmonary artery systolic pressure. The estimated right ventricular  systolic pressure is 16.1 mmHg.   3. Left atrial size was mildly dilated.   4. Right atrial size was moderately dilated.   5. The mitral valve is normal in structure. No evidence of mitral valve  regurgitation.   6. The tricuspid valve is abnormal. Tricuspid valve regurgitation is  severe.   7. The aortic valve is normal in structure. Aortic valve regurgitation is  not visualized.   8. The inferior vena cava is dilated in size with <50% respiratory  variability, suggesting right atrial pressure of 15 mmHg.   Comparison(s): Prior images reviewed side by side. Findings are similar,  but tricuspid insufficiency appears even more severe.  Assessment & Plan   1.  Acute  on chronic diastolic CHF and right-sided CHF-with +2 bilateral lower extremity pitting edema to his knee.  Continues to have generalized bilateral lower extremity edema.  Felt to be secondary to hypotension respiratory fibrosis and sleep apnea. Weight today 222  pounds.  Echocardiogram showed no significant changes from prior study.  Details above. Continue Demadex at current dosing 160 mg a.m. and at noon Zaroxolyn 5 mg on the seventh and on the ninth Heart healthy low-sodium diet-salty 6 given Increase physical activity as tolerated Daily weights-Contact office with a weight increase of 3 pounds overnight or 5 pounds daily. Lower extremity support stockings-aspirin support stocking sheet given Elevate lower extremities when not active-above chest Fluid restriction 48 ounces or less per day  Persistent atrial fibrillation-heart rate today 85 bpm Continue metoprolol Heart healthy low-sodium diet-salty 6 given Increase physical activity as tolerated Avoid triggers caffeine, chocolate, EtOH, dehydration etc.  Coronary artery disease-no recent episodes of arm neck back or chest discomfort.  Not a candidate for aspirin due to history of intracranial hemorrhage. Continue rosuvastatin Heart healthy low-sodium high-fiber diet Increase physical activity as tolerated  Severe tricuspid regurgitation-echocardiogram showed no changes from prior study.  Not felt to be a surgical candidate due to multiple comorbidities.  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  Anemia of chronic disease. Follows with PCP  Disposition: Follow-up with Dr. [name] or me in 1 week.   Jossie Ng. Katie Faraone NP-C    11/22/2021, 2:06 PM [address] Office [phone] Fax [phone]  Notice: This dictation was prepared with Dragon dictation along with smaller phrase technology. Any transcriptional errors that result from this process are unintentional and may not be corrected upon review.  I spent 14 minutes examining this patient, reviewing medications, and using patient centered shared decision  making involving her cardiac care.  Prior to her visit I spent greater than 20 minutes reviewing her past medical history,  medications, and prior cardiac tests.

## 2021-11-22 ENCOUNTER — Ambulatory Visit (INDEPENDENT_AMBULATORY_CARE_PROVIDER_SITE_OTHER): Payer: Medicare Other | Admitting: General Practice

## 2021-11-22 ENCOUNTER — Other Ambulatory Visit: Payer: Self-pay

## 2021-11-22 ENCOUNTER — Encounter: Payer: Self-pay | Admitting: General Practice

## 2021-11-22 VITALS — BP 125/74 | HR 85 | Ht 68.0 in | Wt 222.2 lb

## 2021-11-22 DIAGNOSIS — I5032 Chronic diastolic (congestive) heart failure: Secondary | ICD-10-CM

## 2021-11-22 DIAGNOSIS — I2510 Atherosclerotic heart disease of native coronary artery without angina pectoris: Secondary | ICD-10-CM | POA: Diagnosis not present

## 2021-11-22 DIAGNOSIS — I071 Rheumatic tricuspid insufficiency: Secondary | ICD-10-CM

## 2021-11-22 DIAGNOSIS — Z79899 Other long term (current) drug therapy: Secondary | ICD-10-CM | POA: Diagnosis not present

## 2021-11-22 DIAGNOSIS — I480 Paroxysmal atrial fibrillation: Secondary | ICD-10-CM

## 2021-11-22 DIAGNOSIS — R001 Bradycardia, unspecified: Secondary | ICD-10-CM

## 2021-11-22 DIAGNOSIS — I50812 Chronic right heart failure: Secondary | ICD-10-CM | POA: Diagnosis not present

## 2021-11-22 DIAGNOSIS — E7801 Familial hypercholesterolemia: Secondary | ICD-10-CM | POA: Diagnosis not present

## 2021-11-22 DIAGNOSIS — J8410 Pulmonary fibrosis, unspecified: Secondary | ICD-10-CM | POA: Diagnosis not present

## 2021-11-22 MED ORDER — METOLAZONE 5 MG PO TABS: 5.0000 mg | ORAL_TABLET | Freq: Once | ORAL | 0 refills | Status: DC

## 2021-11-22 MED ORDER — TORSEMIDE 20 MG PO TABS: 80.0000 mg | ORAL_TABLET | Freq: Two times a day (BID) | ORAL | 6 refills | Status: DC

## 2021-11-22 NOTE — Patient Instructions (Signed)
Medication Instructions:  TAKE TORSEMIDE 160MG  AT 9 AM AND AT NOON  TAKE METOLAZONE 5MG  ON 11-23-21 AND 11-25-21 *If you need a refill on your cardiac medications before your next appointment, please call your pharmacy*  Lab Work: BMET ON 11-29-21 If you have labs (blood work) drawn today and your tests are completely normal, you will receive your results only by:  [place] (if you have MyChart) OR A paper copy in the mail.  If you have any lab test that is abnormal or we need to change your treatment, we will call you to review the results. You may go to any [facility] that is convenient for you however, we do have a lab in our office that is able to assist you. You DO NOT need an appointment for our lab. The lab is open 8:00am and closes at 4:00pm. Lunch 12:45 - 1:45pm.  Special Instructions FLUID RESTRICTION 48 OUNCES OF ALL LIQUIDS ONLY  PLEASE READ AND FOLLOW SALTY 6-ATTACHED-1,800 mg daily  TAKE AND LOG YOUR WEIGHT DAILY UPON WAKING  CALL IF YOUR WEIGHT IS UP 3 POUNDS IN A DAY OR 5 POUNDS IN A WEEK  PLEASE PURCHASE AND WEAR COMPRESSION STOCKINGS DAILY AND TAKE OFF AT BEDTIME. Compression stockings are elastic socks that squeeze the legs. They help to increase blood flow to the legs and to decrease swelling in the legs from fluid retention, and reduce the chance of developing blood clots in the lower legs. Please put on in the AM when dressing and off at night when dressing for bed.  LET THEM KNOW THAT YOU NEED KNEE HIGH'S WITH COMPRESSION OF 15-20 mmhg.  [employer]  [place] ([place] [phone]); [place], [place] [address]; [phone]  EMAIL   [email or web address].  PLEASE MAKE SURE TO ELEVATE YOUR FEET & LEGS WHILE SITTING, THIS WILL HELP WITH THE SWELLING ALSO.   Follow-Up: Your next appointment:  NEXT WEEK  In Person with Kirk Ruths, MD  or Coletta Memos, FNP, Fabian Sharp, PA-C, Sande Rives, PA-C, Caron Presume, PA-C, Jory Sims, DNP, ANP,  or [name], PA-C    At [facility], you and your health needs are our priority.  As part of our continuing mission to provide you with exceptional heart care, we have created designated Provider Care Teams.  These Care Teams include your primary Cardiologist (physician) and Advanced Practice Providers (APPs -  Physician Assistants and Nurse Practitioners) who all work together to provide you with the care you need, when you need it.

## 2021-11-29 DIAGNOSIS — J8410 Pulmonary fibrosis, unspecified: Secondary | ICD-10-CM | POA: Diagnosis not present

## 2021-11-29 DIAGNOSIS — R001 Bradycardia, unspecified: Secondary | ICD-10-CM | POA: Diagnosis not present

## 2021-11-29 DIAGNOSIS — I2510 Atherosclerotic heart disease of native coronary artery without angina pectoris: Secondary | ICD-10-CM | POA: Diagnosis not present

## 2021-11-29 DIAGNOSIS — I50812 Chronic right heart failure: Secondary | ICD-10-CM | POA: Diagnosis not present

## 2021-11-29 DIAGNOSIS — I480 Paroxysmal atrial fibrillation: Secondary | ICD-10-CM | POA: Diagnosis not present

## 2021-11-29 DIAGNOSIS — I5032 Chronic diastolic (congestive) heart failure: Secondary | ICD-10-CM | POA: Diagnosis not present

## 2021-11-29 DIAGNOSIS — I071 Rheumatic tricuspid insufficiency: Secondary | ICD-10-CM | POA: Diagnosis not present

## 2021-11-29 DIAGNOSIS — Z79899 Other long term (current) drug therapy: Secondary | ICD-10-CM | POA: Diagnosis not present

## 2021-11-29 DIAGNOSIS — E7801 Familial hypercholesterolemia: Secondary | ICD-10-CM | POA: Diagnosis not present

## 2021-11-30 ENCOUNTER — Other Ambulatory Visit: Payer: Self-pay

## 2021-11-30 ENCOUNTER — Ambulatory Visit (INDEPENDENT_AMBULATORY_CARE_PROVIDER_SITE_OTHER): Payer: Medicare Other | Admitting: Cardiology

## 2021-11-30 ENCOUNTER — Encounter: Payer: Self-pay | Admitting: Cardiology

## 2021-11-30 VITALS — BP 100/70 | HR 99 | Ht 68.0 in | Wt 205.0 lb

## 2021-11-30 DIAGNOSIS — I2510 Atherosclerotic heart disease of native coronary artery without angina pectoris: Secondary | ICD-10-CM | POA: Diagnosis not present

## 2021-11-30 DIAGNOSIS — G4733 Obstructive sleep apnea (adult) (pediatric): Secondary | ICD-10-CM

## 2021-11-30 DIAGNOSIS — Z9989 Dependence on other enabling machines and devices: Secondary | ICD-10-CM

## 2021-11-30 DIAGNOSIS — I50812 Chronic right heart failure: Secondary | ICD-10-CM | POA: Diagnosis not present

## 2021-11-30 DIAGNOSIS — R600 Localized edema: Secondary | ICD-10-CM | POA: Diagnosis not present

## 2021-11-30 DIAGNOSIS — I5032 Chronic diastolic (congestive) heart failure: Secondary | ICD-10-CM | POA: Diagnosis not present

## 2021-11-30 DIAGNOSIS — I071 Rheumatic tricuspid insufficiency: Secondary | ICD-10-CM

## 2021-11-30 DIAGNOSIS — Z950 Presence of cardiac pacemaker: Secondary | ICD-10-CM | POA: Diagnosis not present

## 2021-11-30 DIAGNOSIS — L853 Xerosis cutis: Secondary | ICD-10-CM | POA: Diagnosis not present

## 2021-11-30 LAB — BASIC METABOLIC PANEL
BUN/Creatinine Ratio: 21 (ref 10–24)
BUN: 35 mg/dL — ABNORMAL HIGH (ref 8–27)
CO2: 30 mmol/L — ABNORMAL HIGH (ref 20–29)
Calcium: 9.3 mg/dL (ref 8.6–10.2)
Chloride: 96 mmol/L (ref 96–106)
Creatinine, Ser: 1.68 mg/dL — ABNORMAL HIGH (ref 0.76–1.27)
Glucose: 109 mg/dL — ABNORMAL HIGH (ref 70–99)
Potassium: 3.6 mmol/L (ref 3.5–5.2)
Sodium: 142 mmol/L (ref 134–144)
eGFR: 41 mL/min/{1.73_m2} — ABNORMAL LOW (ref >59)

## 2021-11-30 MED ORDER — METOLAZONE 5 MG PO TABS: ORAL_TABLET | ORAL | 0 refills | Status: DC

## 2021-11-30 NOTE — Patient Instructions (Signed)
Medication Instructions:  Your physician has recommended you make the following change in your medication:  START: Metolazone 5 mg 30 minutes before Torsemide dose on Thursday (12/15) and Sat (12/17) *If you need a refill on your cardiac medications before your next appointment, please call your pharmacy*   Lab Work: None If you have labs (blood work) drawn today and your tests are completely normal, you will receive your results only by: [place] (if you have MyChart) OR A paper copy in the mail If you have any lab test that is abnormal or we need to change your treatment, we will call you to review the results.   Testing/Procedures: None   Follow-Up: At [facility], you and your health needs are our priority.  As part of our continuing mission to provide you with exceptional heart care, we have created designated Provider Care Teams.  These Care Teams include your primary Cardiologist (physician) and Advanced Practice Providers (APPs -  Physician Assistants and Nurse Practitioners) who all work together to provide you with the care you need, when you need it.  We recommend signing up for the patient portal called "MyChart".  Sign up information is provided on this After Visit Summary.  MyChart is used to connect with patients for Virtual Visits (Telemedicine).  Patients are able to view lab/test results, encounter notes, upcoming appointments, etc.  Non-urgent messages can be sent to your provider as well.   To learn more about what you can do with MyChart, go to [email or web address].    Your next appointment:   3 month(s)  The format for your next appointment:   In Person  Provider:   Kirk Ruths, MD     Other Instructions

## 2021-11-30 NOTE — Progress Notes (Signed)
Cardiology Office Note:    Date:  11/30/2021   ID:  Jamie Singleton., DOB Aug 20, 1941, MRN [id number]  PCP:  [name], MD  Cardiologist:  Kirk Ruths, MD  Electrophysiologist:  None   Referring MD: [name], MD   Chief Complaint  Patient presents with   Follow-up    1 week.   Shortness of Breath   Edema    Feet and legs.    History of Present Illness:    Jamie Lashon Hillier. is a 80 y.o. 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  The patient usually follows with Dr. [name] but recently saw Coletta Memos, NP on November 22, 2021 at that time he appeared to be in significant exacerbation of heart failure.  His diuretics were optimized including adding Zaroxolyn.  At that time the patient noted that he had great improvement with the medication.  He has had reduction in his weight as well as fluid retention since that day.  He has had some improvement but still thinks that there is still some fluid that is causing him issues especially with his legs and thighs.   Past Medical History:  Diagnosis Date   (HFpEF) heart failure with preserved ejection fraction ([place])    a. 05/2013 Echo: EF 55%, mild LVH, diast dysfxn, Ao sclerosis, mildly dil LA, RV dysfxn (poorly visualized), PASP 55mmHg;  b. 03/2017 Echo: EF 55-60%, no rwma, triv MR, mildly dil RV, mod TR, PASP 57mmHg.   Atrial fibrillation ([place])    s/p Cox Maze 1/09;  Multaq Rx d/c'd in 2014 due to pulmo fibrosis;  coumadin d/c'd in 2014 due to spontaneous subdural hematoma   BPH (benign prostatic hyperplasia)    CAD (coronary artery disease), native coronary artery    a. s/p CABG 12/2007;  b. Myoview 12/2011: EF 66%, no  scar or ischemia; normal.   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 Jude   Peripheral neuropathy 07/31/2018   Pulmonary fibrosis ([place])    Multaq d/c'd 7/14   Subdural hematoma 07/2012   spontaneous;  coumadin d/c'd => no longer a candidate for anticoagulation    Past Surgical History:  Procedure Laterality Date   AMPUTATION Left 05/17/2019   Procedure: AMPUTATION LEFT FOURTH TOE;  Surgeon: Meredith Pel, MD;  Location: [facility];  Service: Orthopedics;  Laterality: Left;   AMPUTATION TOE Right 02/06/2020   Procedure: AMPUTATION TOE;  Surgeon: Newt Minion, MD;  Location: [facility];  Service: Orthopedics;  Laterality: Right;   AMPUTATION TOE Right 08/17/2020   Procedure: AMPUTATION TOE 4th toe;  Surgeon: Trula Slade, DPM;  Location: [facility];  Service: Podiatry;  Laterality: Right;   APPENDECTOMY     CHOLECYSTECTOMY     CORONARY ARTERY BYPASS GRAFT     x3 (left internal mammary artery to distal left anterior descending coronary artery, saphenous vain graft to second circumflex marginal branch, saphenous vain graft to posterior descending coronary artery, endoscopic saphenous vain harvest from right thigh) and modified Cox - Maze  IV procedure.  [name],MD. Electronically signed CHO/MEDQ D: 01/09/2008/ JOB: [id number] cc:  [name]   CRANIOTOMY  07/30/2012   Procedure: CRANIOTOMY HEMATOMA EVACUATION SUBDURAL;  Surgeon: [name], MD;  Location: [facility];  Service: Neurosurgery;  Laterality: Right;  Right craniotomy for evacuation of subdural hematoma   FOOT SURGERY     HERNIA REPAIR     INTRAMEDULLARY (IM) NAIL INTERTROCHANTERIC Right 02/04/2020   Procedure: INTRAMEDULLARY (IM) NAIL INTERTROCHANTRIC;  Surgeon: Netta Cedars, MD;  Location: [facility];  Service: Orthopedics;  Laterality: Right;   ORCHIECTOMY      Left  /  testicular cancer   PACEMAKER PLACEMENT     PPM - St. Jude   PPM GENERATOR CHANGEOUT N/A 06/25/2019   Procedure: PPM GENERATOR CHANGEOUT;  Surgeon: Evans Lance, MD;  Location: [facility];  Service: Cardiovascular;  Laterality: N/A;    Current Medications: Current Meds  Medication Sig   allopurinol (ZYLOPRIM) 300 MG tablet Take 1 tablet (300 mg total) by mouth daily.   colchicine 0.6 MG tablet TAKE 1 TO 3 TABLETS AS NEEDED DAILY   ferrous sulfate 325 (65 FE) MG tablet Take 1 tablet (325 mg total) by mouth daily with breakfast.   memantine (NAMENDA) 10 MG tablet Take 10 mg by mouth 2 (two) times daily.   metolazone (ZAROXOLYN) 5 MG tablet Take 30 minutes before Torsemide dose on Thursday (12/15) and Sat (12/17)   metoprolol succinate (TOPROL-XL) 50 MG 24 hr tablet Take 1 tablet (50 mg total) by mouth 2 (two) times daily. Take with or immediately following a meal.   mirabegron ER (MYRBETRIQ) 50 MG TB24 tablet Take 1 tablet (50 mg total) by mouth daily.   Multiple Vitamin (MULTIVITAMIN WITH MINERALS) TABS tablet Take 1 tablet by mouth daily.   pantoprazole (PROTONIX) 40 MG tablet Take 1 tablet (40 mg total) by mouth daily.   rosuvastatin (CRESTOR) 40 MG tablet Take 1 tablet (40 mg total) by mouth daily.   sertraline (ZOLOFT) 100 MG tablet Take 2 tablets (200 mg total) by mouth daily.   tamsulosin (FLOMAX) 0.4 MG CAPS capsule Take 1 capsule (0.4 mg total) by mouth at bedtime.   Teriparatide, Recombinant, 620 MCG/2.48ML SOPN inject 20 mcg Subcutaneous Once a day   torsemide (DEMADEX) 20 MG tablet Take 4 tablets (80 mg total) by mouth 2 (two) times daily. 160 mg BID X1WEEK THEN BACK TO 80MG  DAILY   [DISCONTINUED] cephALEXin (KEFLEX) 500 MG capsule Take 500 mg by mouth 4 (four) times daily.   [DISCONTINUED] Multiple Vitamin (MULTI VITAMIN) TABS 1 tablet     Allergies:   Aricept [donepezil], Baclofen, Lipitor [atorvastatin], Nsaids, Warfarin and related, and Enbrel [etanercept]    Social History   Socioeconomic History   Marital status: Married    Spouse name: Immunologist   Number of children: 2   Years of education: Not on file   Highest education level: Not on file  Occupational History   Occupation: Retired- IT trainer  Tobacco Use   Smoking status: Former    Packs/day: 2.00    Years: 20.00    Pack years: 40.00    Types: Cigarettes    Quit date: 02/21/1991    Years since quitting: 30.7   Smokeless tobacco: Never  Vaping Use   Vaping Use: Never used  Substance and Sexual Activity   Alcohol use: No    Alcohol/week: 0.0 standard drinks   Drug use: No   Sexual activity: Not  Currently  Other Topics Concern   Not on file  Social History Narrative   Lives with wife   Right handed    Married with two children.     He is a Engineer, structural.     Social Determinants of Health   Financial Resource Strain: Not on file  Food Insecurity: Not on file  Transportation Needs: Not on file  Physical Activity: Not on file  Stress: Not on file  Social Connections: Not on file     Family History: The patient's family history includes Alzheimer's disease in his mother; Dementia in his mother; Heart attack in his father; Heart disease in his father and mother; Heart failure in his father.  ROS:   Review of Systems  Constitution: Negative for decreased appetite, fever and weight gain.  HENT: Negative for congestion, ear discharge, hoarse voice and sore throat.   Eyes: Negative for discharge, redness, vision loss in right eye and visual halos.  Cardiovascular: Negative for chest pain, dyspnea on exertion, leg swelling, orthopnea and palpitations.  Respiratory: Negative for cough, hemoptysis, shortness of breath and snoring.   Endocrine: Negative for heat intolerance and polyphagia.  Hematologic/Lymphatic: Negative for bleeding problem. Does not bruise/bleed easily.  Skin: Negative for flushing, nail changes, rash and suspicious lesions.  Musculoskeletal: Negative  for arthritis, joint pain, muscle cramps, myalgias, neck pain and stiffness.  Gastrointestinal: Negative for abdominal pain, bowel incontinence, diarrhea and excessive appetite.  Genitourinary: Negative for decreased libido, genital sores and incomplete emptying.  Neurological: Negative for brief paralysis, focal weakness, headaches and loss of balance.  Psychiatric/Behavioral: Negative for altered mental status, depression and suicidal ideas.  Allergic/Immunologic: Negative for HIV exposure and persistent infections.    EKGs/Labs/Other Studies Reviewed:    The following studies were reviewed today:   EKG: None today  TTE 11/02/2021 IMPRESSIONS    1. Left ventricular ejection fraction, by estimation, is 55 to 60%. The  left ventricle has normal function. The left ventricle has no regional  wall motion abnormalities. Left ventricular diastolic function could not  be evaluated. There is the  interventricular septum is flattened in diastole ('D' shaped left  ventricle), consistent with right ventricular volume overload.   2. Right ventricular systolic function is moderately reduced. The right  ventricular size is moderately enlarged. There is moderately elevated  pulmonary artery systolic pressure. The estimated right ventricular  systolic pressure is 16.1 mmHg.   3. Left atrial size was mildly dilated.   4. Right atrial size was moderately dilated.   5. The mitral valve is normal in structure. No evidence of mitral valve  regurgitation.   6. The tricuspid valve is abnormal. Tricuspid valve regurgitation is  severe.   7. The aortic valve is normal in structure. Aortic valve regurgitation is  not visualized.   8. The inferior vena cava is dilated in size with <50% respiratory  variability, suggesting right atrial pressure of 15 mmHg.   Comparison(s): Prior images reviewed side by side. Findings are similar,  but tricuspid insufficiency appears even more severe.   FINDINGS    Left Ventricle: Left ventricular ejection fraction, by estimation, is 55  to 60%. The left ventricle has normal function. The left ventricle has no  regional wall motion abnormalities. The left ventricular internal cavity  size was normal in size. There is   no left ventricular hypertrophy. The interventricular septum is flattened  in diastole ('D' shaped left ventricle), consistent with right ventricular  volume overload. Left ventricular diastolic function could  not be  evaluated due to atrial fibrillation.  Left ventricular diastolic function could not be evaluated.   Right Ventricle: The right ventricular size is moderately enlarged. No  increase in right ventricular wall thickness. Right ventricular systolic  function is moderately reduced. There is moderately elevated pulmonary  artery systolic pressure. The tricuspid   regurgitant velocity is 2.86 m/s, and with an assumed right atrial  pressure of 15 mmHg, the estimated right ventricular systolic pressure is  47.6 mmHg.   Left Atrium: Left atrial size was mildly dilated.   Right Atrium: Right atrial size was moderately dilated.   Pericardium: There is no evidence of pericardial effusion.   Mitral Valve: The mitral valve is normal in structure. No evidence of  mitral valve regurgitation.   Tricuspid Valve: The tricuspid valve is abnormal. Tricuspid valve  regurgitation is severe.   Aortic Valve: The aortic valve is normal in structure. Aortic valve  regurgitation is not visualized.   Pulmonic Valve: The pulmonic valve was not well visualized. Pulmonic valve  regurgitation is not visualized. No evidence of pulmonic stenosis.   Aorta: The aortic root and ascending aorta are structurally normal, with  no evidence of dilitation.   Venous: The inferior vena cava is dilated in size with less than 50%  respiratory variability, suggesting right atrial pressure of 15 mmHg.   IAS/Shunts: The interatrial septum was not well  visualized.   Additional Comments: A device lead is visualized in the right ventricle.        Recent Labs: 10/11/2021: NT-Pro BNP 1,495 11/06/2021: ALT 16; Hemoglobin 9.7; Platelets 111 11/29/2021: BUN 35; Creatinine, Ser 1.68; Potassium 3.6; Sodium 142  Recent Lipid Panel    Component Value Date/Time   CHOL 103 05/22/2019 0338   CHOL 119 09/24/2017 0824   TRIG 58 05/22/2019 0338   HDL 40 (L) 05/22/2019 0338   HDL 42 09/24/2017 0824   CHOLHDL 2.6 05/22/2019 0338   VLDL 12 05/22/2019 0338   LDLCALC 51 05/22/2019 0338   LDLCALC 58 09/24/2017 0824   LDLDIRECT 154.2 10/20/2013 0846    Physical Exam:    VS:  BP 100/70 (BP Location: Left Arm, Patient Position: Sitting, Cuff Size: Normal)    Pulse 99    Ht 5\' 8"  (1.727 m)    Wt 205 lb (93 kg)    BMI 31.17 kg/m     Wt Readings from Last 3 Encounters:  11/30/21 205 lb (93 kg)  11/22/21 222 lb 3.2 oz (100.8 kg)  11/08/21 222 lb 9.6 oz (101 kg)     GEN: Well nourished, well developed in no acute distress HEENT: Normal NECK: No JVD; No carotid bruits LYMPHATICS: No lymphadenopathy CARDIAC: S1S2 noted,RRR, no murmurs, rubs, gallops RESPIRATORY:  Clear to auscultation without rales, wheezing or rhonchi  ABDOMEN: Soft, non-tender, non-distended, +bowel sounds, no guarding. EXTREMITIES:+2 lower extremity edema, No cyanosis, no clubbing MUSCULOSKELETAL:  No deformity  SKIN: Warm and dry NEUROLOGIC:  Alert and oriented x 3, non-focal PSYCHIATRIC:  Normal affect, good insight  ASSESSMENT:    1. Chronic diastolic heart failure ([place])   2. Severe tricuspid regurgitation   3. Coronary artery disease involving native coronary artery of native heart without angina pectoris   4. OSA on CPAP   5. OSA (obstructive sleep apnea)   6. Chronic right-sided CHF (congestive heart failure) ([place])   7. PACEMAKER, PERMANENT    PLAN:     Compared to his visit on November 22, 2021 the patient clinically has improved significantly.  He is back to  taking his Lasix torsemide 80 mg daily.  He tells me he has gained about 3 of 3 pounds.  He still does have some lower extremity edema.  I am going to give the patient Zaroxolyn 5 mg 30 minutes before his torsemide dose for 2 days.  Hopefully this will help with getting him closer to his baseline.  No anginal symptoms.  Continue his medical regimen for CAD   Reviewed his blood work which was done yesterday his kidney function has improved 1.6 on recent labs.  Continues a CPAP.  The patient is in agreement with the above plan. The patient left the office in stable condition.  The patient will follow up in as scheduled Dr. [name]   Medication Adjustments/Labs and Tests Ordered: Current medicines are reviewed at length with the patient today.  Concerns regarding medicines are outlined above.  No orders of the defined types were placed in this encounter.  Meds ordered this encounter  Medications   metolazone (ZAROXOLYN) 5 MG tablet    Sig: Take 30 minutes before Torsemide dose on Thursday (12/15) and Sat (12/17)    Dispense:  2 tablet    Refill:  0    Patient Instructions  Medication Instructions:  Your physician has recommended you make the following change in your medication:  START: Metolazone 5 mg 30 minutes before Torsemide dose on Thursday (12/15) and Sat (12/17) *If you need a refill on your cardiac medications before your next appointment, please call your pharmacy*   Lab Work: None If you have labs (blood work) drawn today and your tests are completely normal, you will receive your results only by: [place] (if you have MyChart) OR A paper copy in the mail If you have any lab test that is abnormal or we need to change your treatment, we will call you to review the results.   Testing/Procedures: None   Follow-Up: At [facility], you and your health needs are our priority.  As part of our continuing mission to provide you with exceptional heart care, we have  created designated Provider Care Teams.  These Care Teams include your primary Cardiologist (physician) and Advanced Practice Providers (APPs -  Physician Assistants and Nurse Practitioners) who all work together to provide you with the care you need, when you need it.  We recommend signing up for the patient portal called "MyChart".  Sign up information is provided on this After Visit Summary.  MyChart is used to connect with patients for Virtual Visits (Telemedicine).  Patients are able to view lab/test results, encounter notes, upcoming appointments, etc.  Non-urgent messages can be sent to your provider as well.   To learn more about what you can do with MyChart, go to [email or web address].    Your next appointment:   3 month(s)  The format for your next appointment:   In Person  Provider:   Kirk Ruths, MD     Other Instructions     Adopting a Healthy Lifestyle.  Know what a healthy weight is for you (roughly BMI <25) and aim to maintain this   Aim for 7+ servings of fruits and vegetables daily   65-80+ fluid ounces of water or unsweet tea for healthy kidneys   Limit to max 1 drink of alcohol per day; avoid smoking/tobacco   Limit animal fats in diet for cholesterol and heart health - choose grass fed whenever available   Avoid highly processed foods, and foods high in saturated/trans fats  Aim for low stress - take time to unwind and care for your mental health   Aim for 150 min of moderate intensity exercise weekly for heart health, and weights twice weekly for bone health   Aim for 7-9 hours of sleep daily   When it comes to diets, agreement about the perfect plan isnt easy to find, even among the experts. Experts at the [employer] developed an idea known as the Healthy Eating Plate. Just imagine a plate divided into logical, healthy portions.   The emphasis is on diet quality:   Load up on vegetables and fruits - one-half of your plate:  Aim for color and variety, and remember that potatoes dont count.   Go for whole grains - one-quarter of your plate: Whole wheat, barley, wheat berries, quinoa, oats, brown rice, and foods made with them. If you want pasta, go with whole wheat pasta.   Protein power - one-quarter of your plate: Fish, chicken, beans, and nuts are all healthy, versatile protein sources. Limit red meat.   The diet, however, does go beyond the plate, offering a few other suggestions.   Use healthy plant oils, such as olive, canola, soy, corn, sunflower and peanut. Check the labels, and avoid partially hydrogenated oil, which have unhealthy trans fats.   If youre thirsty, drink water. Coffee and tea are good in moderation, but skip sugary drinks and limit milk and dairy products to one or two daily servings.   The type of carbohydrate in the diet is more important than the amount. Some sources of carbohydrates, such as vegetables, fruits, whole grains, and beans-are healthier than others.   Finally, stay active  Signed, Berniece Salines, DO  11/30/2021 3:52 PM    [facility]

## 2021-12-01 ENCOUNTER — Ambulatory Visit (INDEPENDENT_AMBULATORY_CARE_PROVIDER_SITE_OTHER): Payer: Medicare Other | Admitting: Podiatry

## 2021-12-01 DIAGNOSIS — M79674 Pain in right toe(s): Secondary | ICD-10-CM

## 2021-12-01 DIAGNOSIS — M2041 Other hammer toe(s) (acquired), right foot: Secondary | ICD-10-CM | POA: Diagnosis not present

## 2021-12-01 DIAGNOSIS — M2042 Other hammer toe(s) (acquired), left foot: Secondary | ICD-10-CM

## 2021-12-01 DIAGNOSIS — I2510 Atherosclerotic heart disease of native coronary artery without angina pectoris: Secondary | ICD-10-CM | POA: Diagnosis not present

## 2021-12-01 DIAGNOSIS — E1149 Type 2 diabetes mellitus with other diabetic neurological complication: Secondary | ICD-10-CM | POA: Diagnosis not present

## 2021-12-01 DIAGNOSIS — L853 Xerosis cutis: Secondary | ICD-10-CM

## 2021-12-01 DIAGNOSIS — B351 Tinea unguium: Secondary | ICD-10-CM

## 2021-12-01 DIAGNOSIS — L84 Corns and callosities: Secondary | ICD-10-CM | POA: Diagnosis not present

## 2021-12-01 DIAGNOSIS — M79675 Pain in left toe(s): Secondary | ICD-10-CM | POA: Diagnosis not present

## 2021-12-04 NOTE — Progress Notes (Signed)
Subjective: [name]. is a 80 y.o. is seen today for foot exam.  He denies any open sores.  Also has of the nails be trimmed as are elongated causing discomfort.  He states that he has been getting some itching to his legs and this started after he was on new medication but is not sure what medication.  He has no new concerns today.  [name], MD  Objective: General: No acute distress DP/PT pulses palpable 2/4, CRT < 3 sec to all digits.  Sensation decreased with Semmes Weinstein monofilament The nails appear to be hypertrophic, dystrophic to the left 1, 2, 3, 5 in the right 1, 3, 5.  No edema, erythema or signs of infection of the toenail sites noted today. There is a preulcerative area in the dorsal aspect of right third toe which is scab is present.  Able to debride some loose scab and underlying skin intact.  No drainage or pus.  No erythema or any signs of infection. There is dry, scaly skin present to his legs.  No skin breakdown in the legs. No other open lesions or pre-ulcerative lesions.  No pain with calf compression, swelling, warmth, erythema.   Assessment and Plan:  Daily foot exam, symptomatic onychomycosis, dry skin, preulcerative area right third toe hammertoes present.  -Treatment options discussed including all alternatives, risks, and complications -Discussed the importance of daily foot inspection proper shoe gear. -Sharply debrided nails x7 without any complications or bleeding -Moisturizer applied to the legs today.  Recommend moisturizer at home as well. -Needs to closely monitor the right third toe.  Continue off wound.  Should have any skin breakdown or any signs or symptoms of infection to let me know immediately.  Return in about 3 months   Trula Slade DPM

## 2021-12-07 ENCOUNTER — Other Ambulatory Visit: Payer: Self-pay

## 2021-12-07 ENCOUNTER — Ambulatory Visit (INDEPENDENT_AMBULATORY_CARE_PROVIDER_SITE_OTHER): Payer: Medicare Other | Admitting: Internal Medicine

## 2021-12-07 ENCOUNTER — Encounter: Payer: Self-pay | Admitting: Internal Medicine

## 2021-12-07 VITALS — BP 98/60 | HR 108 | Ht 68.0 in | Wt 209.0 lb

## 2021-12-07 DIAGNOSIS — I2510 Atherosclerotic heart disease of native coronary artery without angina pectoris: Secondary | ICD-10-CM | POA: Diagnosis not present

## 2021-12-07 DIAGNOSIS — R001 Bradycardia, unspecified: Secondary | ICD-10-CM | POA: Diagnosis not present

## 2021-12-07 DIAGNOSIS — I4819 Other persistent atrial fibrillation: Secondary | ICD-10-CM | POA: Diagnosis not present

## 2021-12-07 DIAGNOSIS — I5032 Chronic diastolic (congestive) heart failure: Secondary | ICD-10-CM

## 2021-12-07 DIAGNOSIS — Z950 Presence of cardiac pacemaker: Secondary | ICD-10-CM

## 2021-12-07 LAB — CUP PACEART INCLINIC DEVICE CHECK
Battery Remaining Longevity: 94 mo
Battery Voltage: 3.01 V
Brady Statistic RA Percent Paced: 0.74 %
Brady Statistic RV Percent Paced: 2.6 %
Date Time Interrogation Session: 20221221163106
Implantable Lead Implant Date: 20090420
Implantable Lead Implant Date: 20090420
Implantable Lead Location: 753859
Implantable Lead Location: 753860
Implantable Pulse Generator Implant Date: 20200708
Lead Channel Impedance Value: 412.5 Ohm
Lead Channel Impedance Value: 462.5 Ohm
Lead Channel Pacing Threshold Amplitude: 0.625 V
Lead Channel Pacing Threshold Pulse Width: 0.4 ms
Lead Channel Sensing Intrinsic Amplitude: 0.8 mV
Lead Channel Sensing Intrinsic Amplitude: 7 mV
Lead Channel Setting Pacing Amplitude: 2 V
Lead Channel Setting Pacing Amplitude: 2.5 V
Lead Channel Setting Pacing Pulse Width: 0.4 ms
Lead Channel Setting Sensing Sensitivity: 2 mV
Pulse Gen Model: 2272
Pulse Gen Serial Number: 9149180

## 2021-12-07 MED ORDER — DIGOXIN 125 MCG PO TABS: ORAL_TABLET | ORAL | 3 refills | Status: DC

## 2021-12-07 MED ORDER — AMIODARONE HCL 200 MG PO TABS: 200.0000 mg | ORAL_TABLET | Freq: Every day | ORAL | 3 refills | Status: DC

## 2021-12-07 NOTE — Patient Instructions (Addendum)
Medication Instructions:  Your physician has recommended you make the following change in your medication:   START taking digoxin 0.125 mg-  Take one tablet by mouth Monday through Friday.  DO NOT take on Saturday or Sunday.  Labwork: None ordered.  Testing/Procedures: None ordered.  Follow-Up: Your physician wants you to follow-up in: 3 months with Jamie Peru, MD   Remote monitoring is used to monitor your Pacemaker from home. This monitoring reduces the number of office visits required to check your device to one time per year. It allows [place] to keep an eye on the functioning of your device to ensure it is working properly. You are scheduled for a device check from home on 12/20/2021. You may send your transmission at any time that day. If you have a wireless device, the transmission will be sent automatically. After your physician reviews your transmission, you will receive a postcard with your next transmission date.  Any Other Special Instructions Will Be Listed Below (If Applicable).  If you need a refill on your cardiac medications before your next appointment, please call your pharmacy.   Digoxin Tablets What is this medication? DIGOXIN (di JOX in) treats heart failure. It may also be used to treat a type of arrhythmia known as AFib (atrial fibrillation). It works by helping your heart beat stronger, making it easier for your heart to pump blood to the rest of the body. It also slows down overactive electric signals in the heart, which stabilizes your heart rhythm. This medicine may be used for other purposes; ask your health care provider or pharmacist if you have questions. COMMON BRAND NAME(S): Digitek, Lanoxicaps, Lanoxin What should I tell my care team before I take this medication? They need to know if you have any of these conditions: Certain heart rhythm disorders Heart disease or recent heart attack Kidney or liver disease An unusual or allergic reaction to digoxin, other  medications, foods, dyes, or preservatives Pregnant or trying to get pregnant Breast-feeding How should I use this medication? Take this medication by mouth with a glass of water. Follow the directions on the prescription label. Take your doses at regular intervals. Do not take your medication more often than directed. Talk to your care team about the use of this medication in children. Special care may be needed. Overdosage: If you think you have taken too much of this medicine contact a poison control center or emergency room at once. NOTE: This medicine is only for you. Do not share this medicine with others. What if I miss a dose? If you miss a dose, take it as soon as you can. If it is almost time for your next dose, take only that dose. Do not take double or extra doses. What may interact with this medication? Activated charcoal Albuterol Alprazolam Antacids Antiviral medications for HIV or AIDS like ritonavir and saquinavir Calcium Certain antibiotics like azithromycin, clarithromycin, erythromycin, gentamicin, neomycin, trimethoprim, and tetracycline Certain medications for blood pressure, heart disease, irregular heart beat Certain medications for cancer Certain medications for cholesterol like atorvastatin, cholestyramine, and colestipol Certain medications for diabetes, like acarbose, exenatide, miglitol, and metformin Certain medications for fungal infections like ketoconazole and itraconazole Certain medications for stomach problems like omeprazole, esomeprazole, lansoprazole, rabeprazole, metoclopramide, and sucralfate Conivaptan Cyclosporine Diphenoxylate Epinephrine Kaolin; pectin Nefazodone NSAIDS, medications for pain and inflammation, like celecoxib, ibuprofen, or naproxen Penicillamine Phenytoin Propantheline Quinine Phenytoin Rifampin Succinylcholine St. John's Wort Sulfasalazine Teriparatide Thyroid hormones Tolvaptan This list may not describe all  possible  interactions. Give your health care provider a list of all the medicines, herbs, non-prescription drugs, or dietary supplements you use. Also tell them if you smoke, drink alcohol, or use illegal drugs. Some items may interact with your medicine. What should I watch for while using this medication? Visit your care team for regular checks on your progress. Do not stop taking this medication without the advice of your care team, even if you feel better. Do not change the brand you are taking, other brands may affect you differently. Check your heart rate and blood pressure regularly while you are taking this medication. Ask your care team what your heart rate and blood pressure should be, and when you should contact him or her. Your care team also may schedule regular blood tests and electrocardiograms to check your progress. Watch your diet. Less digoxin may be absorbed from the stomach if you have a diet high in bran fiber. Do not treat yourself for coughs, colds or allergies without asking your care team for advice. Some ingredients can increase possible side effects. What side effects may I notice from receiving this medication? Side effects that you should report to your care team as soon as possible: Allergic reactions--skin rash, itching, hives, swelling of the face, lips, tongue, or throat Digoxin toxicity--confusion, loss of appetite, nausea, vomiting, diarrhea, change in vision such as blurry or yellow vision, fatigue, fast or irregular heartbeat Slow heartbeat--dizziness, feeling faint or lightheaded, confusion, trouble breathing, unusual weakness or fatigue Side effects that usually do not require medical attention (report to your care team if they continue or are bothersome): Dizziness Stomach pain Unexpected breast tissue growth This list may not describe all possible side effects. Call your doctor for medical advice about side effects. You may report side effects to FDA at  1-800-FDA-1088. Where should I keep my medication? Keep out of the reach of children. Store at room temperature between 15 and 30 degrees C (59 and 86 degrees F). Protect from light and moisture. Throw away any unused medication after the expiration date. NOTE: This sheet is a summary. It may not cover all possible information. If you have questions about this medicine, talk to your doctor, pharmacist, or health care provider.  2022 Elsevier/Gold Standard (2021-03-13 00:00:00)

## 2021-12-07 NOTE — Progress Notes (Signed)
HPI [name] returns today for followup. He is a pleasant 80 yo man with chronic atrial fib, severe TR, chronic peripheral edema and CAD. He has had some compliance problems with both his meds and diet. He denies eating too much salt but he eats out all of the time. He has not had syncope. He has class 3 dyspnea. He also has chronic lung problems. When I saw him last we increased his beta blocker as he had gone into atrial fib with a RVR and felt worse. His atrial fib dates back over 10 years. He has had lung toxicity on amiodarone and multaq.  Allergies  Allergen Reactions   Aricept [Donepezil] Other (See Comments)    Hallucination    Baclofen Itching   Lipitor [Atorvastatin] Other (See Comments)    Stiff joints   Nsaids Other (See Comments)    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   Warfarin And Related Other (See Comments)    Stiff joints   Enbrel [Etanercept] Itching     Current Outpatient Medications  Medication Sig Dispense Refill   allopurinol (ZYLOPRIM) 300 MG tablet Take 1 tablet (300 mg total) by mouth daily. 30 tablet 0   colchicine 0.6 MG tablet TAKE 1 TO 3 TABLETS AS NEEDED DAILY     ferrous sulfate 325 (65 FE) MG tablet Take 1 tablet (325 mg total) by mouth daily with breakfast. 30 tablet 3   memantine (NAMENDA) 10 MG tablet Take 10 mg by mouth 2 (two) times daily.     metolazone (ZAROXOLYN) 5 MG tablet Take 30 minutes before Torsemide dose on Thursday (12/15) and Sat (12/17) 2 tablet 0   metoprolol succinate (TOPROL-XL) 50 MG 24 hr tablet Take 1 tablet (50 mg total) by mouth 2 (two) times daily. Take with or immediately following a meal. 180 tablet 3   mirabegron ER (MYRBETRIQ) 50 MG TB24 tablet Take 1 tablet (50 mg total) by mouth daily. 30 tablet 3   modafinil (PROVIGIL) 100 MG tablet Take 0.5 tablets (50 mg total) by mouth daily. 30 tablet 5   Multiple Vitamin (MULTIVITAMIN WITH MINERALS) TABS tablet Take 1 tablet by mouth daily.     pantoprazole (PROTONIX) 40 MG  tablet Take 1 tablet (40 mg total) by mouth daily. 30 tablet 0   rosuvastatin (CRESTOR) 40 MG tablet Take 1 tablet (40 mg total) by mouth daily. 90 tablet 2   sertraline (ZOLOFT) 100 MG tablet Take 2 tablets (200 mg total) by mouth daily. 30 tablet 0   tamsulosin (FLOMAX) 0.4 MG CAPS capsule Take 1 capsule (0.4 mg total) by mouth at bedtime. 30 capsule 1   Teriparatide, Recombinant, 620 MCG/2.48ML SOPN inject 20 mcg Subcutaneous Once a day 1 mL 11   torsemide (DEMADEX) 20 MG tablet Take 4 tablets (80 mg total) by mouth 2 (two) times daily. 160 mg BID X1WEEK THEN BACK TO 80MG  DAILY 45 tablet 6   No current facility-administered medications for this visit.     Past Medical History:  Diagnosis Date   (HFpEF) heart failure with preserved ejection fraction ([place])    a. 05/2013 Echo: EF 55%, mild LVH, diast dysfxn, Ao sclerosis, mildly dil LA, RV dysfxn (poorly visualized), PASP 13mmHg;  b. 03/2017 Echo: EF 55-60%, no rwma, triv MR, mildly dil RV, mod TR, PASP 19mmHg.   Atrial fibrillation [facility])    s/p Cox Maze 1/09;  Multaq Rx d/c'd in 2014 due to pulmo fibrosis;  coumadin d/c'd in 2014 due  to spontaneous subdural hematoma   BPH (benign prostatic hyperplasia)    CAD (coronary artery disease), native coronary artery    a. s/p CABG 12/2007;  b. Myoview 12/2011: EF 66%, no scar or ischemia; normal.   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 Jude   Peripheral neuropathy 07/31/2018   Pulmonary fibrosis ([place])    Multaq d/c'd 7/14   Subdural hematoma 07/2012   spontaneous;  coumadin d/c'd => no longer a candidate for anticoagulation    ROS:   All systems reviewed and negative except as noted in the HPI.   Past Surgical History:  Procedure Laterality Date   AMPUTATION Left 05/17/2019   Procedure: AMPUTATION LEFT FOURTH TOE;  Surgeon:  Meredith Pel, MD;  Location: [place];  Service: Orthopedics;  Laterality: Left;   AMPUTATION TOE Right 02/06/2020   Procedure: AMPUTATION TOE;  Surgeon: Newt Minion, MD;  Location: [place];  Service: Orthopedics;  Laterality: Right;   AMPUTATION TOE Right 08/17/2020   Procedure: AMPUTATION TOE 4th toe;  Surgeon: Trula Slade, DPM;  Location[facility];  Service: Podiatry;  Laterality: Right;   APPENDECTOMY     CHOLECYSTECTOMY     CORONARY ARTERY BYPASS GRAFT     x3 (left internal mammary artery to distal left anterior descending coronary artery, saphenous vain graft to second circumflex marginal branch, saphenous vain graft to posterior descending coronary artery, endoscopic saphenous vain harvest from right thigh) and modified Cox - Maze IV procedure.  [name],MD. Electronically signed CHO/MEDQ D: 01/09/2008/ JOB: [id number] cc:  [name]   CRANIOTOMY  07/30/2012   Procedure: CRANIOTOMY HEMATOMA EVACUATION SUBDURAL;  Surgeon: [name], MD;  Location: [place] [facility];  Service: Neurosurgery;  Laterality: Right;  Right craniotomy for evacuation of subdural hematoma   FOOT SURGERY     HERNIA REPAIR     INTRAMEDULLARY (IM) NAIL INTERTROCHANTERIC Right 02/04/2020   Procedure: INTRAMEDULLARY (IM) NAIL INTERTROCHANTRIC;  Surgeon: Netta Cedars, MD;  Location: [place];  Service: Orthopedics;  Laterality: Right;   ORCHIECTOMY     Left  /  testicular cancer   PACEMAKER PLACEMENT     PPM - St. Jude   PPM GENERATOR CHANGEOUT N/A 06/25/2019   Procedure: PPM GENERATOR CHANGEOUT;  Surgeon: Evans Lance, MD;  Location: [place] [facility];  Service: Cardiovascular;  Laterality: N/A;     Family History  Problem Relation Age of Onset   Heart disease Father    Heart attack Father    Heart failure Father    Heart disease Mother    Alzheimer's disease Mother    Dementia Mother      Social History   Socioeconomic History   Marital status: Married    Spouse name: Immunologist   Number of  children: 2   Years of education: Not on file   Highest education level: Not on file  Occupational History   Occupation: Retired- IT trainer  Tobacco Use   Smoking status: Former    Packs/day: 2.00    Years: 20.00    Pack years: 40.00    Types: Cigarettes    Quit date: 02/21/1991    Years since quitting: 30.8   Smokeless tobacco: Never  Vaping Use   Vaping Use: Never used  Substance and Sexual Activity   Alcohol use: No    Alcohol/week: 0.0 standard drinks   Drug use: No   Sexual activity: Not Currently  Other Topics Concern   Not on file  Social History Narrative   Lives with wife   Right handed    Married with two children.     He is a Engineer, structural.     Social Determinants of Health   Financial Resource Strain: Not on file  Food Insecurity: Not on file  Transportation Needs: Not on file  Physical Activity: Not on file  Stress: Not on file  Social Connections: Not on file  Intimate Partner Violence: Not on file     BP 98/60    Pulse (!) 108    Ht 5\' 8"  (1.727 m)    Wt 209 lb (94.8 kg)    SpO2 98%    BMI 31.78 kg/m   Physical Exam:  Well appearing NAD HEENT: Unremarkable Neck:  No JVD, no thyromegally Lymphatics:  No adenopathy Back:  No CVA tenderness Lungs:  Clear HEART:  Regular rate rhythm, no murmurs, no rubs, no clicks Abd:  soft, positive bowel sounds, no organomegally, no rebound, no guarding Ext:  2 plus pulses, no edema, no cyanosis, no clubbing Skin:  No rashes no nodules Neuro:  CN II through XII intact, motor grossly intact   DEVICE  Normal device function.  See PaceArt for details. He is in atrial tachycardia  Assess/Plan:  Uncontrolled atrial fib - He has reverted out of atrial fib. He will continue his current meds.  Atrial tachycardia - today he was in atrial tach at 110/min. I paced him back to NSR but he went back to atrial tachycardia. I have recommended a trial of digoxin as we have very limited options for maintenance of  NSR. CAD - he denies anginal symtpoms.  Diastolic heart failure - his symptoms are class 3. He will continue his diuretic and hopefully rate control will help him. Severe TR - this is part of the problem. I suspect that his PPM is contributing.   Carleene Overlie Brindy Higginbotham,MD

## 2021-12-13 DIAGNOSIS — Z961 Presence of intraocular lens: Secondary | ICD-10-CM | POA: Diagnosis not present

## 2021-12-13 DIAGNOSIS — H35372 Puckering of macula, left eye: Secondary | ICD-10-CM | POA: Diagnosis not present

## 2021-12-13 DIAGNOSIS — H353131 Nonexudative age-related macular degeneration, bilateral, early dry stage: Secondary | ICD-10-CM | POA: Diagnosis not present

## 2021-12-20 ENCOUNTER — Ambulatory Visit (INDEPENDENT_AMBULATORY_CARE_PROVIDER_SITE_OTHER): Payer: Medicare Other

## 2021-12-20 DIAGNOSIS — I4819 Other persistent atrial fibrillation: Secondary | ICD-10-CM | POA: Diagnosis not present

## 2021-12-20 LAB — CUP PACEART REMOTE DEVICE CHECK
Battery Remaining Longevity: 91 mo
Battery Remaining Percentage: 79 %
Battery Voltage: 3.01 V
Brady Statistic AP VP Percent: 1.3 %
Brady Statistic AP VS Percent: 1 %
Brady Statistic AS VP Percent: 1.4 %
Brady Statistic AS VS Percent: 85 %
Brady Statistic RA Percent Paced: 1 %
Brady Statistic RV Percent Paced: 2.5 %
Date Time Interrogation Session: 20230103031650
Implantable Lead Implant Date: 20090420
Implantable Lead Implant Date: 20090420
Implantable Lead Location: 753859
Implantable Lead Location: 753860
Implantable Pulse Generator Implant Date: 20200708
Lead Channel Impedance Value: 450 Ohm
Lead Channel Impedance Value: 530 Ohm
Lead Channel Pacing Threshold Amplitude: 0.75 V
Lead Channel Pacing Threshold Amplitude: 0.75 V
Lead Channel Pacing Threshold Pulse Width: 0.4 ms
Lead Channel Pacing Threshold Pulse Width: 0.4 ms
Lead Channel Sensing Intrinsic Amplitude: 0.8 mV
Lead Channel Sensing Intrinsic Amplitude: 6.8 mV
Lead Channel Setting Pacing Amplitude: 2 V
Lead Channel Setting Pacing Amplitude: 2.5 V
Lead Channel Setting Pacing Pulse Width: 0.4 ms
Lead Channel Setting Sensing Sensitivity: 2 mV
Pulse Gen Model: 2272
Pulse Gen Serial Number: 9149180

## 2021-12-29 NOTE — Progress Notes (Signed)
Remote pacemaker transmission.   

## 2022-01-31 DIAGNOSIS — R600 Localized edema: Secondary | ICD-10-CM | POA: Diagnosis not present

## 2022-01-31 DIAGNOSIS — R1310 Dysphagia, unspecified: Secondary | ICD-10-CM | POA: Diagnosis not present

## 2022-02-02 ENCOUNTER — Other Ambulatory Visit: Payer: Self-pay

## 2022-02-02 ENCOUNTER — Ambulatory Visit (INDEPENDENT_AMBULATORY_CARE_PROVIDER_SITE_OTHER): Payer: Medicare Other | Admitting: Podiatry

## 2022-02-02 DIAGNOSIS — M79674 Pain in right toe(s): Secondary | ICD-10-CM | POA: Diagnosis not present

## 2022-02-02 DIAGNOSIS — E1149 Type 2 diabetes mellitus with other diabetic neurological complication: Secondary | ICD-10-CM | POA: Diagnosis not present

## 2022-02-02 DIAGNOSIS — M79675 Pain in left toe(s): Secondary | ICD-10-CM | POA: Diagnosis not present

## 2022-02-02 DIAGNOSIS — B351 Tinea unguium: Secondary | ICD-10-CM

## 2022-02-02 DIAGNOSIS — L84 Corns and callosities: Secondary | ICD-10-CM | POA: Diagnosis not present

## 2022-02-05 NOTE — Progress Notes (Signed)
Subjective: [name]. is a 81 y.o. is seen today for foot exam and for thick, elongated toes that he cannot trim himself.  He has not seen any open sores and denies any swelling or redness or any drainage.  He has no fevers or chills.  He has no other concerns today.    [name], MD  Objective: General: No acute distress DP/PT pulses palpable 2/4, CRT < 3 sec to all digits.  Sensation decreased with Semmes Weinstein monofilament The nails appear to be hypertrophic, dystrophic to the left 1, 3, 5 in the right 1, 3, 5.  No edema, erythema or signs of infection of the toenail sites noted today. There are preulcerative calluses noted on the right foot submetatarsal 1, left hallux, left third toe. No pain with calf compression, swelling, warmth, erythema.        Assessment and Plan:  Daily foot exam, symptomatic onychomycosis, dry skin, preulcerative area right third toe hammertoes present.  -Treatment options discussed including all alternatives, risks, and complications -Discussed the importance of daily foot inspection proper shoe gear. -Sharply debrided nails x 6 without any complications or bleeding -Sharply debrided hyperkeratotic lesions x3 without any complications or bleeding.  No signs of infection noted today. -Debrided the hyperkeratotic lesions with any complications or bleeding.  He is to keep a very close monitoring of the symptoms should there be any signs of infection or any open sores that know immediately. -Monitor for any clinical signs or symptoms of infection and directed to call the office immediately should any occur or go to the ER.  Return in about 2 months (around 04/04/2022).  Trula Slade DPM

## 2022-02-06 ENCOUNTER — Other Ambulatory Visit (HOSPITAL_COMMUNITY): Payer: Self-pay

## 2022-02-06 DIAGNOSIS — I8393 Asymptomatic varicose veins of bilateral lower extremities: Secondary | ICD-10-CM | POA: Diagnosis not present

## 2022-02-06 DIAGNOSIS — E1140 Type 2 diabetes mellitus with diabetic neuropathy, unspecified: Secondary | ICD-10-CM | POA: Diagnosis not present

## 2022-02-06 DIAGNOSIS — I6529 Occlusion and stenosis of unspecified carotid artery: Secondary | ICD-10-CM | POA: Diagnosis not present

## 2022-02-06 DIAGNOSIS — M109 Gout, unspecified: Secondary | ICD-10-CM | POA: Diagnosis not present

## 2022-02-06 DIAGNOSIS — E785 Hyperlipidemia, unspecified: Secondary | ICD-10-CM | POA: Diagnosis not present

## 2022-02-06 DIAGNOSIS — K219 Gastro-esophageal reflux disease without esophagitis: Secondary | ICD-10-CM | POA: Diagnosis not present

## 2022-02-06 DIAGNOSIS — I4891 Unspecified atrial fibrillation: Secondary | ICD-10-CM | POA: Diagnosis not present

## 2022-02-06 DIAGNOSIS — K7460 Unspecified cirrhosis of liver: Secondary | ICD-10-CM | POA: Diagnosis not present

## 2022-02-06 DIAGNOSIS — M8580 Other specified disorders of bone density and structure, unspecified site: Secondary | ICD-10-CM | POA: Diagnosis not present

## 2022-02-06 DIAGNOSIS — G4733 Obstructive sleep apnea (adult) (pediatric): Secondary | ICD-10-CM | POA: Diagnosis not present

## 2022-02-06 DIAGNOSIS — F0280 Dementia in other diseases classified elsewhere without behavioral disturbance: Secondary | ICD-10-CM | POA: Diagnosis not present

## 2022-02-06 DIAGNOSIS — I130 Hypertensive heart and chronic kidney disease with heart failure and stage 1 through stage 4 chronic kidney disease, or unspecified chronic kidney disease: Secondary | ICD-10-CM | POA: Diagnosis not present

## 2022-02-06 DIAGNOSIS — D631 Anemia in chronic kidney disease: Secondary | ICD-10-CM | POA: Diagnosis not present

## 2022-02-06 DIAGNOSIS — I5042 Chronic combined systolic (congestive) and diastolic (congestive) heart failure: Secondary | ICD-10-CM | POA: Diagnosis not present

## 2022-02-06 DIAGNOSIS — F339 Major depressive disorder, recurrent, unspecified: Secondary | ICD-10-CM | POA: Diagnosis not present

## 2022-02-06 DIAGNOSIS — R1310 Dysphagia, unspecified: Secondary | ICD-10-CM

## 2022-02-06 DIAGNOSIS — G3183 Dementia with Lewy bodies: Secondary | ICD-10-CM | POA: Diagnosis not present

## 2022-02-06 DIAGNOSIS — R059 Cough, unspecified: Secondary | ICD-10-CM

## 2022-02-06 DIAGNOSIS — J45909 Unspecified asthma, uncomplicated: Secondary | ICD-10-CM | POA: Diagnosis not present

## 2022-02-06 DIAGNOSIS — E1122 Type 2 diabetes mellitus with diabetic chronic kidney disease: Secondary | ICD-10-CM | POA: Diagnosis not present

## 2022-02-06 DIAGNOSIS — I2510 Atherosclerotic heart disease of native coronary artery without angina pectoris: Secondary | ICD-10-CM | POA: Diagnosis not present

## 2022-02-06 DIAGNOSIS — M1990 Unspecified osteoarthritis, unspecified site: Secondary | ICD-10-CM | POA: Diagnosis not present

## 2022-02-06 DIAGNOSIS — H9193 Unspecified hearing loss, bilateral: Secondary | ICD-10-CM | POA: Diagnosis not present

## 2022-02-06 DIAGNOSIS — L853 Xerosis cutis: Secondary | ICD-10-CM | POA: Diagnosis not present

## 2022-02-06 DIAGNOSIS — N1831 Chronic kidney disease, stage 3a: Secondary | ICD-10-CM | POA: Diagnosis not present

## 2022-02-06 DIAGNOSIS — K760 Fatty (change of) liver, not elsewhere classified: Secondary | ICD-10-CM | POA: Diagnosis not present

## 2022-02-07 DIAGNOSIS — D631 Anemia in chronic kidney disease: Secondary | ICD-10-CM | POA: Diagnosis not present

## 2022-02-07 DIAGNOSIS — R1310 Dysphagia, unspecified: Secondary | ICD-10-CM | POA: Diagnosis not present

## 2022-02-07 DIAGNOSIS — I130 Hypertensive heart and chronic kidney disease with heart failure and stage 1 through stage 4 chronic kidney disease, or unspecified chronic kidney disease: Secondary | ICD-10-CM | POA: Diagnosis not present

## 2022-02-07 DIAGNOSIS — I5042 Chronic combined systolic (congestive) and diastolic (congestive) heart failure: Secondary | ICD-10-CM | POA: Diagnosis not present

## 2022-02-07 DIAGNOSIS — E1122 Type 2 diabetes mellitus with diabetic chronic kidney disease: Secondary | ICD-10-CM | POA: Diagnosis not present

## 2022-02-07 DIAGNOSIS — N1831 Chronic kidney disease, stage 3a: Secondary | ICD-10-CM | POA: Diagnosis not present

## 2022-02-13 DIAGNOSIS — N1831 Chronic kidney disease, stage 3a: Secondary | ICD-10-CM | POA: Diagnosis not present

## 2022-02-13 DIAGNOSIS — I130 Hypertensive heart and chronic kidney disease with heart failure and stage 1 through stage 4 chronic kidney disease, or unspecified chronic kidney disease: Secondary | ICD-10-CM | POA: Diagnosis not present

## 2022-02-13 DIAGNOSIS — I5042 Chronic combined systolic (congestive) and diastolic (congestive) heart failure: Secondary | ICD-10-CM | POA: Diagnosis not present

## 2022-02-13 DIAGNOSIS — R1310 Dysphagia, unspecified: Secondary | ICD-10-CM | POA: Diagnosis not present

## 2022-02-13 DIAGNOSIS — E1122 Type 2 diabetes mellitus with diabetic chronic kidney disease: Secondary | ICD-10-CM | POA: Diagnosis not present

## 2022-02-13 DIAGNOSIS — D631 Anemia in chronic kidney disease: Secondary | ICD-10-CM | POA: Diagnosis not present

## 2022-02-14 ENCOUNTER — Ambulatory Visit (INDEPENDENT_AMBULATORY_CARE_PROVIDER_SITE_OTHER): Payer: Medicare Other | Admitting: Internal Medicine

## 2022-02-14 ENCOUNTER — Other Ambulatory Visit: Payer: Self-pay

## 2022-02-14 ENCOUNTER — Encounter: Payer: Self-pay | Admitting: Internal Medicine

## 2022-02-14 VITALS — BP 104/62 | HR 70 | Ht 68.0 in | Wt 202.0 lb

## 2022-02-14 DIAGNOSIS — I4819 Other persistent atrial fibrillation: Secondary | ICD-10-CM

## 2022-02-14 DIAGNOSIS — I5032 Chronic diastolic (congestive) heart failure: Secondary | ICD-10-CM | POA: Diagnosis not present

## 2022-02-14 NOTE — Progress Notes (Signed)
HPI Mr. Boulanger returns today for followup. He is a pleasant 81 yo man with dementia, HTN, COPD, and PAF. He was in AT when I saw him in December and feeling poorly. He was paced back into rhythm and I started him on digoxin in addition to his beta blocker. He quit taking the digoxin last month.  Allergies  Allergen Reactions   Aricept [Donepezil] Other (See Comments)    Hallucination    Baclofen Itching   Lipitor [Atorvastatin] Other (See Comments)    Stiff joints   Nsaids Other (See Comments)    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   Warfarin And Related Other (See Comments)    Stiff joints   Enbrel [Etanercept] Itching     Current Outpatient Medications  Medication Sig Dispense Refill   allopurinol (ZYLOPRIM) 300 MG tablet Take 1 tablet (300 mg total) by mouth daily. 30 tablet 0   colchicine 0.6 MG tablet TAKE 1 TO 3 TABLETS AS NEEDED DAILY     ferrous sulfate 325 (65 FE) MG tablet Take 1 tablet (325 mg total) by mouth daily with breakfast. 30 tablet 3   memantine (NAMENDA) 10 MG tablet Take 10 mg by mouth 2 (two) times daily.     metoprolol succinate (TOPROL-XL) 50 MG 24 hr tablet Take 1 tablet (50 mg total) by mouth 2 (two) times daily. Take with or immediately following a meal. 180 tablet 3   mirabegron ER (MYRBETRIQ) 50 MG TB24 tablet Take 1 tablet (50 mg total) by mouth daily. 30 tablet 3   modafinil (PROVIGIL) 100 MG tablet Take 0.5 tablets (50 mg total) by mouth daily. 30 tablet 5   Multiple Vitamin (MULTIVITAMIN WITH MINERALS) TABS tablet Take 1 tablet by mouth daily.     pantoprazole (PROTONIX) 40 MG tablet Take 1 tablet (40 mg total) by mouth daily. 30 tablet 0   rosuvastatin (CRESTOR) 40 MG tablet Take 1 tablet (40 mg total) by mouth daily. 90 tablet 2   sertraline (ZOLOFT) 100 MG tablet Take 2 tablets (200 mg total) by mouth daily. 30 tablet 0   tamsulosin (FLOMAX) 0.4 MG CAPS capsule Take 1 capsule (0.4 mg total) by mouth at bedtime. 30 capsule 1   Teriparatide,  Recombinant, 620 MCG/2.48ML SOPN inject 20 mcg Subcutaneous Once a day 1 mL 11   torsemide (DEMADEX) 20 MG tablet Take 4 tablets (80 mg total) by mouth 2 (two) times daily. 160 mg BID X1WEEK THEN BACK TO 80MG  DAILY (Patient taking differently: Take 30 mg by mouth 2 (two) times daily. 02/14/22 states pcp told to take 1 1/2 tablets daily) 45 tablet 6   digoxin (LANOXIN) 0.125 MG tablet Take one tablet by mouth Monday through Friday.  DO NOT take on Saturday or Sunday. (Patient not taking: Reported on 02/14/2022) 90 tablet 3   metolazone (ZAROXOLYN) 5 MG tablet Take 30 minutes before Torsemide dose on Thursday (12/15) and Sat (12/17) 2 tablet 0   No current facility-administered medications for this visit.     Past Medical History:  Diagnosis Date   (HFpEF) heart failure with preserved ejection fraction ([place])    a. 05/2013 Echo: EF 55%, mild LVH, diast dysfxn, Ao sclerosis, mildly dil LA, RV dysfxn (poorly visualized), PASP 45mmHg;  b. 03/2017 Echo: EF 55-60%, no rwma, triv MR, mildly dil RV, mod TR, PASP 26mmHg.   Atrial fibrillation [facility])    s/p Cox Maze 1/09;  Multaq Rx d/c'd in 2014 due to pulmo fibrosis;  coumadin d/c'd in 2014 due to spontaneous subdural hematoma   BPH (benign prostatic hyperplasia)    CAD (coronary artery disease), native coronary artery    a. s/p CABG 12/2007;  b. Myoview 12/2011: EF 66%, no scar or ischemia; normal.   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 Jude   Peripheral neuropathy 07/31/2018   Pulmonary fibrosis ([place])    Multaq d/c'd 7/14   Subdural hematoma 07/2012   spontaneous;  coumadin d/c'd => no longer a candidate for anticoagulation    ROS:   All systems reviewed and negative except as noted in the HPI.   Past Surgical History:  Procedure Laterality Date   AMPUTATION Left 05/17/2019    Procedure: AMPUTATION LEFT FOURTH TOE;  Surgeon: Meredith Pel, MD;  Location: [place];  Service: Orthopedics;  Laterality: Left;   AMPUTATION TOE Right 02/06/2020   Procedure: AMPUTATION TOE;  Surgeon: Newt Minion, MD;  Location: [place];  Service: Orthopedics;  Laterality: Right;   AMPUTATION TOE Right 08/17/2020   Procedure: AMPUTATION TOE 4th toe;  Surgeon: Trula Slade, DPM;  [facility];  Service: Podiatry;  Laterality: Right;   APPENDECTOMY     CHOLECYSTECTOMY     CORONARY ARTERY BYPASS GRAFT     x3 (left internal mammary artery to distal left anterior descending coronary artery, saphenous vain graft to second circumflex marginal branch, saphenous vain graft to posterior descending coronary artery, endoscopic saphenous vain harvest from right thigh) and modified Cox - Maze IV procedure.  [name],MD. Electronically signed CHO/MEDQ D: 01/09/2008/ JOB: [id number] cc:  [name]   CRANIOTOMY  07/30/2012   Procedure: CRANIOTOMY HEMATOMA EVACUATION SUBDURAL;  Surgeon: [name], MD;  Location: [place] [facility];  Service: Neurosurgery;  Laterality: Right;  Right craniotomy for evacuation of subdural hematoma   FOOT SURGERY     HERNIA REPAIR     INTRAMEDULLARY (IM) NAIL INTERTROCHANTERIC Right 02/04/2020   Procedure: INTRAMEDULLARY (IM) NAIL INTERTROCHANTRIC;  Surgeon: Netta Cedars, MD;  Location: [place];  Service: Orthopedics;  Laterality: Right;   ORCHIECTOMY     Left  /  testicular cancer   PACEMAKER PLACEMENT     PPM - St. Jude   PPM GENERATOR CHANGEOUT N/A 06/25/2019   Procedure: PPM GENERATOR CHANGEOUT;  Surgeon: Evans Lance, MD;  Location: [place] [facility];  Service: Cardiovascular;  Laterality: N/A;     Family History  Problem Relation Age of Onset   Heart disease Father    Heart attack Father    Heart failure Father    Heart disease Mother    Alzheimer's disease Mother    Dementia Mother      Social History   Socioeconomic History   Marital  status: Married    Spouse name: Immunologist   Number of children: 2   Years of education: Not on file   Highest education level: Not on file  Occupational History   Occupation: Retired- IT trainer  Tobacco Use   Smoking status: Former    Packs/day: 2.00    Years: 20.00    Pack years: 40.00    Types: Cigarettes    Quit date: 02/21/1991    Years since quitting: 31.0   Smokeless tobacco: Never  Vaping Use  Vaping Use: Never used  Substance and Sexual Activity   Alcohol use: No    Alcohol/week: 0.0 standard drinks   Drug use: No   Sexual activity: Not Currently  Other Topics Concern   Not on file  Social History Narrative   Lives with wife   Right handed    Married with two children.     He is a Engineer, structural.     Social Determinants of Health   Financial Resource Strain: Not on file  Food Insecurity: Not on file  Transportation Needs: Not on file  Physical Activity: Not on file  Stress: Not on file  Social Connections: Not on file  Intimate Partner Violence: Not on file     BP 104/62    Pulse 70    Ht 5\' 8"  (1.727 m)    Wt 202 lb (91.6 kg)    SpO2 96%    BMI 30.71 kg/m   Physical Exam:  Well appearing NAD HEENT: Unremarkable Neck:  No JVD, no thyromegally Lymphatics:  No adenopathy Back:  No CVA tenderness Lungs:  Clear HEART:  Regular rate rhythm, no murmurs, no rubs, no clicks Abd:  soft, positive bowel sounds, no organomegally, no rebound, no guarding Ext:  2 plus pulses, no edema, no cyanosis, no clubbing Skin:  No rashes no nodules Neuro:  CN II through XII intact, motor grossly intact  DEVICE  Normal device function.  See PaceArt for details.   Assess/Plan:  Atrial tachy - he is better. I asked him to continue the beta blocker and restart the digoxin daily 5 days a week.  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 Atrial fib - he is maintaining NSR.  CAD - he denies anginal symptoms.  Diastolic heart failure - his symptoms are now  class 2 from 3. He will continue his current meds.  Carleene Overlie Eljay Lave,MD

## 2022-02-14 NOTE — Patient Instructions (Signed)
Medication Instructions:  Your physician recommends that you continue on your current medications as directed. Please refer to the Current Medication list given to you today.  *If you need a refill on your cardiac medications before your next appointment, please call your pharmacy*   Lab Work: NONE   If you have labs (blood work) drawn today and your tests are completely normal, you will receive your results only by: MyChart Message (if you have MyChart) OR A paper copy in the mail If you have any lab test that is abnormal or we need to change your treatment, we will call you to review the results.   Testing/Procedures: NONE    Follow-Up: At [facility], you and your health needs are our priority.  As part of our continuing mission to provide you with exceptional heart care, we have created designated Provider Care Teams.  These Care Teams include your primary Cardiologist (physician) and Advanced Practice Providers (APPs -  Physician Assistants and Nurse Practitioners) who all work together to provide you with the care you need, when you need it.  We recommend signing up for the patient portal called "MyChart".  Sign up information is provided on this After Visit Summary.  MyChart is used to connect with patients for Virtual Visits (Telemedicine).  Patients are able to view lab/test results, encounter notes, upcoming appointments, etc.  Non-urgent messages can be sent to your provider as well.   To learn more about what you can do with MyChart, go to [email or web address].    Your next appointment:   6 month(s)  The format for your next appointment:   In Person  Provider:   Gregg Taylor, MD   Other Instructions Thank you for choosing [facility]!    

## 2022-02-15 ENCOUNTER — Ambulatory Visit (HOSPITAL_COMMUNITY)
Admission: RE | Admit: 2022-02-15 | Discharge: 2022-02-15 | Disposition: A | Discharge status: Home / Self Care | Payer: Medicare Other | Source: Ambulatory Visit | Attending: Internal Medicine | Admitting: Internal Medicine

## 2022-02-15 DIAGNOSIS — X58XXXA Exposure to other specified factors, initial encounter: Secondary | ICD-10-CM | POA: Insufficient documentation

## 2022-02-15 DIAGNOSIS — T17398A Other foreign object in larynx causing other injury, initial encounter: Secondary | ICD-10-CM | POA: Insufficient documentation

## 2022-02-15 DIAGNOSIS — R1310 Dysphagia, unspecified: Secondary | ICD-10-CM | POA: Insufficient documentation

## 2022-02-15 DIAGNOSIS — R059 Cough, unspecified: Secondary | ICD-10-CM | POA: Diagnosis not present

## 2022-02-15 NOTE — Progress Notes (Addendum)
? ?  Modified Barium Swallow Progress Note ? ?Patient Details  ?Name: [name]. ?MRN: [id number] ?Date of Birth: [birth year] ? ?Today's Date: 02/15/2022 ? ?Modified Barium Swallow completed.  Full report located under Chart Review in the Imaging Section. ? ?Brief recommendations include the following: ? ?Clinical Impression ? Pt was seen in radiology suite for modified barium swallow study. Trials of puree solids, regular texture solids, a 4mm barium tablet, and thin liquids via cup and straw were administered. Pt's oropharyngeal swallow mechanism was within functional limits. Phrayngeal clearance was excellent without any residue. A single instance of penetration (PAS 3) was noted during the fifth swallow when pt was prompted to consume consecutive swallows of thin liquids via straw. However, no other instances of penetration were noted and no aspiration was demonstrated. Pt may continue his current diet of regular texture solids and thin liquids with his continued use of swallowing precautions. Retention and slowed transport of barium was noted in the upper thoracic esophagus during esophageal sweep. The etiology of this cannot be determined with this study; considering this and pt's symptoms, further esophageal assessment (e.g., esophagram) is therefore recommended. ?  ?Swallow Evaluation Recommendations ? ? Recommended Consults: Consider esophageal assessment ? ? SLP Diet Recommendations: Regular solids;Thin liquid ? ? Liquid Administration via: Straw;Cup (avoid consecutive swallows) ? ? Medication Administration: Whole meds with liquid ? ? Supervision: Patient able to self feed ? ? Compensations: Slow rate;Small sips/bites;Follow solids with liquid ? ? Postural Changes: Remain semi-upright after after feeds/meals (Comment);Seated upright at 90 degrees ? ? Oral Care Recommendations: Oral care BID ? ?   ? ?Caylei Sperry I. Hardin Negus, [place], CCC-SLP ?Acute Rehabilitation Services ?Office number [phone] ?Pager  [phone] ? ? ?Horton Marshall ?02/15/2022,1:37 PM ? ?

## 2022-02-16 DIAGNOSIS — D631 Anemia in chronic kidney disease: Secondary | ICD-10-CM | POA: Diagnosis not present

## 2022-02-16 DIAGNOSIS — L570 Actinic keratosis: Secondary | ICD-10-CM | POA: Diagnosis not present

## 2022-02-16 DIAGNOSIS — N1831 Chronic kidney disease, stage 3a: Secondary | ICD-10-CM | POA: Diagnosis not present

## 2022-02-16 DIAGNOSIS — R1310 Dysphagia, unspecified: Secondary | ICD-10-CM | POA: Diagnosis not present

## 2022-02-16 DIAGNOSIS — I130 Hypertensive heart and chronic kidney disease with heart failure and stage 1 through stage 4 chronic kidney disease, or unspecified chronic kidney disease: Secondary | ICD-10-CM | POA: Diagnosis not present

## 2022-02-16 DIAGNOSIS — I5042 Chronic combined systolic (congestive) and diastolic (congestive) heart failure: Secondary | ICD-10-CM | POA: Diagnosis not present

## 2022-02-16 DIAGNOSIS — Z85828 Personal history of other malignant neoplasm of skin: Secondary | ICD-10-CM | POA: Diagnosis not present

## 2022-02-16 DIAGNOSIS — D485 Neoplasm of uncertain behavior of skin: Secondary | ICD-10-CM | POA: Diagnosis not present

## 2022-02-16 DIAGNOSIS — E1122 Type 2 diabetes mellitus with diabetic chronic kidney disease: Secondary | ICD-10-CM | POA: Diagnosis not present

## 2022-02-16 DIAGNOSIS — C44319 Basal cell carcinoma of skin of other parts of face: Secondary | ICD-10-CM | POA: Diagnosis not present

## 2022-02-16 DIAGNOSIS — C44219 Basal cell carcinoma of skin of left ear and external auricular canal: Secondary | ICD-10-CM | POA: Diagnosis not present

## 2022-02-21 ENCOUNTER — Other Ambulatory Visit: Payer: Self-pay | Admitting: Internal Medicine

## 2022-02-21 DIAGNOSIS — D631 Anemia in chronic kidney disease: Secondary | ICD-10-CM | POA: Diagnosis not present

## 2022-02-21 DIAGNOSIS — N1831 Chronic kidney disease, stage 3a: Secondary | ICD-10-CM | POA: Diagnosis not present

## 2022-02-21 DIAGNOSIS — E1122 Type 2 diabetes mellitus with diabetic chronic kidney disease: Secondary | ICD-10-CM | POA: Diagnosis not present

## 2022-02-21 DIAGNOSIS — I5042 Chronic combined systolic (congestive) and diastolic (congestive) heart failure: Secondary | ICD-10-CM | POA: Diagnosis not present

## 2022-02-21 DIAGNOSIS — R1310 Dysphagia, unspecified: Secondary | ICD-10-CM | POA: Diagnosis not present

## 2022-02-21 DIAGNOSIS — I130 Hypertensive heart and chronic kidney disease with heart failure and stage 1 through stage 4 chronic kidney disease, or unspecified chronic kidney disease: Secondary | ICD-10-CM | POA: Diagnosis not present

## 2022-02-22 DIAGNOSIS — I10 Essential (primary) hypertension: Secondary | ICD-10-CM | POA: Diagnosis not present

## 2022-02-22 DIAGNOSIS — F339 Major depressive disorder, recurrent, unspecified: Secondary | ICD-10-CM | POA: Diagnosis not present

## 2022-02-22 DIAGNOSIS — D61818 Other pancytopenia: Secondary | ICD-10-CM | POA: Diagnosis not present

## 2022-02-22 DIAGNOSIS — M109 Gout, unspecified: Secondary | ICD-10-CM | POA: Diagnosis not present

## 2022-02-22 DIAGNOSIS — Z79899 Other long term (current) drug therapy: Secondary | ICD-10-CM | POA: Diagnosis not present

## 2022-02-22 DIAGNOSIS — I4819 Other persistent atrial fibrillation: Secondary | ICD-10-CM | POA: Diagnosis not present

## 2022-02-22 DIAGNOSIS — E1121 Type 2 diabetes mellitus with diabetic nephropathy: Secondary | ICD-10-CM | POA: Diagnosis not present

## 2022-02-22 DIAGNOSIS — D696 Thrombocytopenia, unspecified: Secondary | ICD-10-CM | POA: Diagnosis not present

## 2022-02-22 DIAGNOSIS — G3183 Dementia with Lewy bodies: Secondary | ICD-10-CM | POA: Diagnosis not present

## 2022-02-22 DIAGNOSIS — N183 Chronic kidney disease, stage 3 unspecified: Secondary | ICD-10-CM | POA: Diagnosis not present

## 2022-02-22 DIAGNOSIS — G4719 Other hypersomnia: Secondary | ICD-10-CM | POA: Diagnosis not present

## 2022-02-22 DIAGNOSIS — D508 Other iron deficiency anemias: Secondary | ICD-10-CM | POA: Diagnosis not present

## 2022-02-22 DIAGNOSIS — I2510 Atherosclerotic heart disease of native coronary artery without angina pectoris: Secondary | ICD-10-CM | POA: Diagnosis not present

## 2022-02-23 DIAGNOSIS — D631 Anemia in chronic kidney disease: Secondary | ICD-10-CM | POA: Diagnosis not present

## 2022-02-23 DIAGNOSIS — I5042 Chronic combined systolic (congestive) and diastolic (congestive) heart failure: Secondary | ICD-10-CM | POA: Diagnosis not present

## 2022-02-23 DIAGNOSIS — E1122 Type 2 diabetes mellitus with diabetic chronic kidney disease: Secondary | ICD-10-CM | POA: Diagnosis not present

## 2022-02-23 DIAGNOSIS — I130 Hypertensive heart and chronic kidney disease with heart failure and stage 1 through stage 4 chronic kidney disease, or unspecified chronic kidney disease: Secondary | ICD-10-CM | POA: Diagnosis not present

## 2022-02-23 DIAGNOSIS — R1310 Dysphagia, unspecified: Secondary | ICD-10-CM | POA: Diagnosis not present

## 2022-02-23 DIAGNOSIS — N1831 Chronic kidney disease, stage 3a: Secondary | ICD-10-CM | POA: Diagnosis not present

## 2022-02-28 ENCOUNTER — Ambulatory Visit
Admission: RE | Admit: 2022-02-28 | Discharge: 2022-02-28 | Disposition: A | Discharge status: Home / Self Care | Payer: Medicare Other | Source: Ambulatory Visit | Attending: Internal Medicine | Admitting: Internal Medicine

## 2022-02-28 DIAGNOSIS — K224 Dyskinesia of esophagus: Secondary | ICD-10-CM | POA: Diagnosis not present

## 2022-02-28 DIAGNOSIS — R1310 Dysphagia, unspecified: Secondary | ICD-10-CM

## 2022-03-01 DIAGNOSIS — D631 Anemia in chronic kidney disease: Secondary | ICD-10-CM | POA: Diagnosis not present

## 2022-03-01 DIAGNOSIS — N1831 Chronic kidney disease, stage 3a: Secondary | ICD-10-CM | POA: Diagnosis not present

## 2022-03-01 DIAGNOSIS — R1310 Dysphagia, unspecified: Secondary | ICD-10-CM | POA: Diagnosis not present

## 2022-03-01 DIAGNOSIS — I5042 Chronic combined systolic (congestive) and diastolic (congestive) heart failure: Secondary | ICD-10-CM | POA: Diagnosis not present

## 2022-03-01 DIAGNOSIS — E1122 Type 2 diabetes mellitus with diabetic chronic kidney disease: Secondary | ICD-10-CM | POA: Diagnosis not present

## 2022-03-01 DIAGNOSIS — I130 Hypertensive heart and chronic kidney disease with heart failure and stage 1 through stage 4 chronic kidney disease, or unspecified chronic kidney disease: Secondary | ICD-10-CM | POA: Diagnosis not present

## 2022-03-02 DIAGNOSIS — I5042 Chronic combined systolic (congestive) and diastolic (congestive) heart failure: Secondary | ICD-10-CM | POA: Diagnosis not present

## 2022-03-02 DIAGNOSIS — R1310 Dysphagia, unspecified: Secondary | ICD-10-CM | POA: Diagnosis not present

## 2022-03-02 DIAGNOSIS — E1122 Type 2 diabetes mellitus with diabetic chronic kidney disease: Secondary | ICD-10-CM | POA: Diagnosis not present

## 2022-03-02 DIAGNOSIS — I130 Hypertensive heart and chronic kidney disease with heart failure and stage 1 through stage 4 chronic kidney disease, or unspecified chronic kidney disease: Secondary | ICD-10-CM | POA: Diagnosis not present

## 2022-03-02 DIAGNOSIS — N1831 Chronic kidney disease, stage 3a: Secondary | ICD-10-CM | POA: Diagnosis not present

## 2022-03-02 DIAGNOSIS — D631 Anemia in chronic kidney disease: Secondary | ICD-10-CM | POA: Diagnosis not present

## 2022-03-02 NOTE — Progress Notes (Signed)
? ? ? ? ?HPI: FU CAD, CHF and atrial fibrillation. He underwent CABG (LIMA-LAD, SVG-OM 2, SVG-PDA) along with modified Cox Maze IV procedure in 12/2007. 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 Abdominal US 3/12: No aneurysm. 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 He underwent craniotomy and evacuation by Dr. Saintclair Halsted. He was taken off of Coumadin and no longer felt to be an anticoagulation candidate. Multaq DCed previously as felt causing pulmonary toxixity. Nuclear study 5/18 showed EF 59 with normal perfusion. ABIs June 2020 normal. Carotid Dopplers June 2020 showed 1 to 39% bilateral stenosis. Transcranial Dopplers June 2020 negative. Chest CT August 2022 showed pulmonary fibrosis, probable cirrhosis, enlarged spleen and enlarged pulmonary trunk suggestive of pulmonary hypertension. Monitor November 2022 showed atrial fibrillation rate mildly elevated. Metoprolol was increased.   Echocardiogram November 2022 showed normal LV function, flattened septum suggestive of volume overload, moderate right ventricular enlargement with moderate RV dysfunction, moderate pulmonary hypertension, mild left atrial enlargement, moderate right atrial enlargement, severe tricuspid regurgitation.  Patient seen recently by Dr. Lovena Le and digoxin added for improved rate control of his atrial fibrillation/atrial tachycardia.  Since last seen, he has dyspnea with more vigorous activities but not routine activities.  He denies orthopnea or PND but does have mild pedal edema but improved compared to previous.  No chest pain, palpitations or syncope. ? ?Current Outpatient Medications  ?Medication Sig Dispense Refill  ? allopurinol (ZYLOPRIM) 300 MG tablet Take 1 tablet (300 mg total) by mouth daily. 30 tablet 0  ? colchicine 0.6 MG tablet TAKE 1 TO 3 TABLETS AS NEEDED DAILY    ? digoxin (LANOXIN) 0.125 MG tablet Take one tablet by mouth Monday through Friday.  DO NOT  take on Saturday or Sunday. 90 tablet 3  ? ferrous sulfate 325 (65 FE) MG tablet Take 1 tablet (325 mg total) by mouth daily with breakfast. 30 tablet 3  ? memantine (NAMENDA) 10 MG tablet Take 10 mg by mouth 2 (two) times daily.    ? metoprolol succinate (TOPROL-XL) 25 MG 24 hr tablet Take 0.5 tablets by mouth daily.    ? mirabegron ER (MYRBETRIQ) 50 MG TB24 tablet Take 1 tablet (50 mg total) by mouth daily. 30 tablet 3  ? modafinil (PROVIGIL) 100 MG tablet Take 1 tablet (100 mg total) by mouth daily. 30 tablet 5  ? Multiple Vitamin (MULTIVITAMIN WITH MINERALS) TABS tablet Take 1 tablet by mouth daily.    ? pantoprazole (PROTONIX) 40 MG tablet Take 1 tablet (40 mg total) by mouth daily. 30 tablet 0  ? rosuvastatin (CRESTOR) 40 MG tablet Take 1 tablet (40 mg total) by mouth daily. 90 tablet 2  ? sertraline (ZOLOFT) 100 MG tablet Take 2 tablets (200 mg total) by mouth daily. 30 tablet 0  ? tamsulosin (FLOMAX) 0.4 MG CAPS capsule Take 1 capsule (0.4 mg total) by mouth at bedtime. 30 capsule 1  ? Teriparatide, Recombinant, 620 MCG/2.48ML SOPN inject 20 mcg Subcutaneous Once a day (Patient taking differently: Takes injection twice a year.) 1 mL 11  ? torsemide (DEMADEX) 20 MG tablet Take 4 tablets (80 mg total) by mouth 2 (two) times daily. 160 mg BID X1WEEK THEN BACK TO '80MG'$  DAILY (Patient taking differently: Take 30 mg by mouth 2 (two) times daily. 02/14/22 states pcp told to take 1 1/2 tablets daily) 45 tablet 6  ? metolazone (ZAROXOLYN) 5 MG tablet Take 30 minutes before Torsemide dose on Thursday (12/15) and Sat (12/17) (Patient not  taking: Reported on 03/09/2022) 2 tablet 0  ? ?No current facility-administered medications for this visit.  ? ? ? ?Past Medical History:  ?Diagnosis Date  ? (HFpEF) heart failure with preserved ejection fraction ([place])   ? a. 05/2013 Echo: EF 55%, mild LVH, diast dysfxn, Ao sclerosis, mildly dil LA, RV dysfxn (poorly visualized), PASP 22mHg;  b. 03/2017 Echo: EF 55-60%, no rwma, triv MR,  mildly dil RV, mod TR, PASP 437mg.  ? [facility]  ? s/p Cox Maze 1/09;  Multaq Rx d/c'd in 2014 due to pulmo fibrosis;  coumadin d/c'd in 2014 due to spontaneous subdural hematoma  ? BPH (benign prostatic hyperplasia)   ? CAD (coronary artery disease), native coronary artery   ? a. s/p CABG 12/2007;  b. Myoview 12/2011: EF 66%, no scar or ischemia; normal.  ? DM (diabetes mellitus) ([place]  ? Hyperlipidemia type II   ? Hypertension   ? 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 OSA (obstructive sleep apnea)   ? Pacemaker   ? PPM - St. Jude  ? Peripheral neuropathy 07/31/2018  ? Pulmonary fibrosis ([facility]  ? Multaq d/c'd 7/14  ? Subdural hematoma 07/2012  ? spontaneous;  coumadin d/c'd => no longer a candidate for anticoagulation  ? ? ?Past Surgical History:  ?Procedure Laterality Date  ? AMPUTATION Left 05/17/2019  ? Procedure: AMPUTATION LEFT FOURTH TOE;  Surgeon: DeMeredith PelMD;  [facility] Service: Orthopedics;  Laterality: Left;  ? AMPUTATION TOE Right 02/06/2020  ? Procedure: AMPUTATION TOE;  Surgeon: DuNewt MinionMD;  [place] [facility]: Orthopedics;  Laterality: Right;  ? AMPUTATION TOE Right 08/17/2020  ? Procedure: AMPUTATION TOE 4th toe;  Surgeon: WaTrula SladeDPM;  [facility];  Service: Podiatry;  Laterality: Right;  ? APPENDECTOMY    ? CHOLECYSTECTOMY    ? CORONARY ARTERY BYPASS GRAFT    ? x3 (left internal mammary artery to distal left anterior descending coronary artery, saphenous vain graft to second circumflex marginal branch, saphenous vain graft to posterior descending coronary artery, endoscopic saphenous vain harvest from right thigh) and modified Cox - Maze IV procedure.  ClValentina GuOwen,MD. Electronically signed CHO/MEDQ D: 01/09/2008/ JOB: [id number]:  JoIran SizerD  ? CRKyla Balzarine8/13/2013  ? Procedure: CRANIOTOMY HEMATOMA EVACUATION SUBDURAL;  Surgeon: GaElaina HoopsMD;  [facility];  Service:  Neurosurgery;  Laterality: Right;  Right craniotomy for evacuation of subdural hematoma  ? FOOT SURGERY    ? HERNIA REPAIR    ? INTRAMEDULLARY (IM) NAIL INTERTROCHANTERIC Right 02/04/2020  ? Procedure: INTRAMEDULLARY (IM) NAIL INTERTROCHANTRIC;  Surgeon: NoNetta CedarsMD;  [facility] Service: Orthopedics;  Laterality: Right;  ? ORCHIECTOMY    ? Left  /  testicular cancer  ? PACEMAKER PLACEMENT    ? PPM - St. Jude  ? PPM GENERATOR CHANGEOUT N/A 06/25/2019  ? Procedure: PPM GENERATOR CHANGEOUT;  Surgeon: TaEvans LanceMD;  [facility];  Service: Cardiovascular;  Laterality: N/A;  ? ? ?Social History  ? ?Socioeconomic History  ? Marital status: Married  ?  Spouse name: ViAdine Madura? Number of children: 2  ? Years of education: Not on file  ? Highest education level: Not on file  ?Occupational History  ? Occupation: Retired- fiIT trainer?Tobacco Use  ? Smoking status: Former  ?  Packs/day: 2.00  ?  Years:  20.00  ?  Pack years: 40.00  ?  Types: Cigarettes  ?  Quit date: 02/21/1991  ?  Years since quitting: 31.0  ? Smokeless tobacco: Never  ?Vaping Use  ? Vaping Use: Never used  ?Substance and Sexual Activity  ? Alcohol use: No  ?  Alcohol/week: 0.0 standard drinks  ? Drug use: No  ? Sexual activity: Not Currently  ?Other Topics Concern  ? Not on file  ?Social History Narrative  ? Lives with wife  ? Right handed   ? Married with two children.    ? He is a Engineer, structural.    ? ?Social Determinants of Health  ? ?Financial Resource Strain: Not on file  ?Food Insecurity: Not on file  ?Transportation Needs: Not on file  ?Physical Activity: Not on file  ?Stress: Not on file  ?Social Connections: Not on file  ?Intimate Partner Violence: Not on file  ? ? ?Family History  ?Problem Relation Age of Onset  ? Heart disease Father   ? Heart attack Father   ? Heart failure Father   ? Heart disease Mother   ? Alzheimer's disease Mother   ? Dementia Mother   ? ? ?ROS: no fevers or chills, productive cough, hemoptysis,  dysphasia, odynophagia, melena, hematochezia, dysuria, hematuria, rash, seizure activity, orthopnea, PND, claudication. Remaining systems are negative. ? ?Physical Exam: ?Well-developed well-nourished in no

## 2022-03-08 ENCOUNTER — Ambulatory Visit (INDEPENDENT_AMBULATORY_CARE_PROVIDER_SITE_OTHER): Payer: Medicare Other | Admitting: Adult Health

## 2022-03-08 VITALS — BP 119/77 | HR 100 | Ht 68.0 in | Wt 199.2 lb

## 2022-03-08 DIAGNOSIS — I8393 Asymptomatic varicose veins of bilateral lower extremities: Secondary | ICD-10-CM | POA: Diagnosis not present

## 2022-03-08 DIAGNOSIS — G4733 Obstructive sleep apnea (adult) (pediatric): Secondary | ICD-10-CM | POA: Diagnosis not present

## 2022-03-08 DIAGNOSIS — E1122 Type 2 diabetes mellitus with diabetic chronic kidney disease: Secondary | ICD-10-CM | POA: Diagnosis not present

## 2022-03-08 DIAGNOSIS — G3183 Dementia with Lewy bodies: Secondary | ICD-10-CM | POA: Diagnosis not present

## 2022-03-08 DIAGNOSIS — E785 Hyperlipidemia, unspecified: Secondary | ICD-10-CM | POA: Diagnosis not present

## 2022-03-08 DIAGNOSIS — I130 Hypertensive heart and chronic kidney disease with heart failure and stage 1 through stage 4 chronic kidney disease, or unspecified chronic kidney disease: Secondary | ICD-10-CM | POA: Diagnosis not present

## 2022-03-08 DIAGNOSIS — M109 Gout, unspecified: Secondary | ICD-10-CM | POA: Diagnosis not present

## 2022-03-08 DIAGNOSIS — N1831 Chronic kidney disease, stage 3a: Secondary | ICD-10-CM | POA: Diagnosis not present

## 2022-03-08 DIAGNOSIS — R413 Other amnesia: Secondary | ICD-10-CM

## 2022-03-08 DIAGNOSIS — L853 Xerosis cutis: Secondary | ICD-10-CM | POA: Diagnosis not present

## 2022-03-08 DIAGNOSIS — M8580 Other specified disorders of bone density and structure, unspecified site: Secondary | ICD-10-CM | POA: Diagnosis not present

## 2022-03-08 DIAGNOSIS — I4891 Unspecified atrial fibrillation: Secondary | ICD-10-CM | POA: Diagnosis not present

## 2022-03-08 DIAGNOSIS — F0280 Dementia in other diseases classified elsewhere without behavioral disturbance: Secondary | ICD-10-CM | POA: Diagnosis not present

## 2022-03-08 DIAGNOSIS — F339 Major depressive disorder, recurrent, unspecified: Secondary | ICD-10-CM | POA: Diagnosis not present

## 2022-03-08 DIAGNOSIS — I2510 Atherosclerotic heart disease of native coronary artery without angina pectoris: Secondary | ICD-10-CM | POA: Diagnosis not present

## 2022-03-08 DIAGNOSIS — I5042 Chronic combined systolic (congestive) and diastolic (congestive) heart failure: Secondary | ICD-10-CM | POA: Diagnosis not present

## 2022-03-08 DIAGNOSIS — G4711 Idiopathic hypersomnia with long sleep time: Secondary | ICD-10-CM | POA: Diagnosis not present

## 2022-03-08 DIAGNOSIS — I6529 Occlusion and stenosis of unspecified carotid artery: Secondary | ICD-10-CM | POA: Diagnosis not present

## 2022-03-08 DIAGNOSIS — Z9989 Dependence on other enabling machines and devices: Secondary | ICD-10-CM

## 2022-03-08 DIAGNOSIS — D631 Anemia in chronic kidney disease: Secondary | ICD-10-CM | POA: Diagnosis not present

## 2022-03-08 DIAGNOSIS — H9193 Unspecified hearing loss, bilateral: Secondary | ICD-10-CM | POA: Diagnosis not present

## 2022-03-08 DIAGNOSIS — K219 Gastro-esophageal reflux disease without esophagitis: Secondary | ICD-10-CM | POA: Diagnosis not present

## 2022-03-08 DIAGNOSIS — E1140 Type 2 diabetes mellitus with diabetic neuropathy, unspecified: Secondary | ICD-10-CM | POA: Diagnosis not present

## 2022-03-08 DIAGNOSIS — M1990 Unspecified osteoarthritis, unspecified site: Secondary | ICD-10-CM | POA: Diagnosis not present

## 2022-03-08 DIAGNOSIS — K7460 Unspecified cirrhosis of liver: Secondary | ICD-10-CM | POA: Diagnosis not present

## 2022-03-08 DIAGNOSIS — D508 Other iron deficiency anemias: Secondary | ICD-10-CM | POA: Diagnosis not present

## 2022-03-08 DIAGNOSIS — I5032 Chronic diastolic (congestive) heart failure: Secondary | ICD-10-CM | POA: Diagnosis not present

## 2022-03-08 DIAGNOSIS — J45909 Unspecified asthma, uncomplicated: Secondary | ICD-10-CM | POA: Diagnosis not present

## 2022-03-08 DIAGNOSIS — K760 Fatty (change of) liver, not elsewhere classified: Secondary | ICD-10-CM | POA: Diagnosis not present

## 2022-03-08 DIAGNOSIS — R1310 Dysphagia, unspecified: Secondary | ICD-10-CM | POA: Diagnosis not present

## 2022-03-08 MED ORDER — MODAFINIL 100 MG PO TABS: 100.0000 mg | ORAL_TABLET | Freq: Every day | ORAL | 5 refills | Status: DC

## 2022-03-08 NOTE — Progress Notes (Signed)
? ? ?PATIENT: [name]. ?DOB: 1941-08-23 ? ?REASON FOR VISIT: follow up ?HISTORY FROM: patient ? ?HISTORY OF PRESENT ILLNESS: ?Today 03/08/22: ? ?Mr. Tourigny is a 81 year old male with a history of obstructive sleep apnea on CPAP.  He returns today for follow-up.  He still struggles with daytime sleepiness.  He reports that he did try modafinil 50 mg daily.  Unsure of the benefit but reports insurance will no longer cover this. ? ?In the past he was seeing Dr. Jannifer Franklin for some memory issues.  His wife reports that she has noticed some issues with his memory.  He is able to complete all ADLs independently.  He no longer drives.  He is on Namenda. ? ? ? ? ?03/08/21: Mr. Wearing is a 81 year old male with a history of obstructive sleep apnea on CPAP and excessive daytime sleepiness.  He returns today for follow-up.  He reports that there are some nights that he does not sleep greater than 4 hours.  He states that he also sleeps a lot during the day.  He does not think he ever got the prescription for modafinil.  He will check at home and ensure that he is not taking his medicine.  Wife reports that they can be driving and he will fall asleep in the car.  He does not operate a motor vehicle.  She states that she can have a conversation with him and he falls asleep during the conversation. ? ? ? ?09/08/20: Mr. Turnage is a 80 year old male with a history of obstructive sleep apnea on CPAP.  His download indicates that he uses machine 21 out of 30 days for compliance of 70%.  He uses machine greater than 4 hours 19 days for compliance of 63%.  On average he uses his machine 6 hours and 33 minutes.  His residual AHI is 3.6 on 6 to 16 cm of water with EPR of 3.  Leak in the 95th percentile is 43.2 L/min.  He reports that he just was able to get modafinil last night.  He has not taken a dose yet. ? ?HISTORY Patient CPAP download indicates that he did use his machine nightly for compliance of 100%. He uses machine greater  than 4 hours 16 days for compliance of 53%. On average he uses his machine 4 hours and 35 minutes. His residual AHI is 3.7 on 6 to 16 cm of water with EPR of 3. Leak in the 95th percentile is 29.6 L/min. ?  ?The patient and his wife state that he continues to nap throughout the day. Wife reports that he typically goes back to bed around 2 PM and will not get up to 5 PM. Reports that he has several naps throughout the day. Patient reports that he would like to be more awake during the day. He is interested in trying medication ? ?REVIEW OF SYSTEMS: Out of a complete 14 system review of symptoms, the patient complains only of the following symptoms, and all other reviewed systems are negative. ? ?See HPI ? ?ALLERGIES: ?Allergies  ?Allergen Reactions  ? Aricept [Donepezil] Other (See Comments)  ?  Hallucination ?  ? Baclofen Itching  ? Lipitor [Atorvastatin] Other (See Comments)  ?  Stiff joints  ? Nsaids Other (See Comments)  ?  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  ? Warfarin And Related Other (See Comments)  ?  Stiff joints  ? Enbrel [Etanercept] Itching  ? ? ?HOME MEDICATIONS: ?Outpatient Medications Prior to Visit  ?Medication  Sig Dispense Refill  ? allopurinol (ZYLOPRIM) 300 MG tablet Take 1 tablet (300 mg total) by mouth daily. 30 tablet 0  ? colchicine 0.6 MG tablet TAKE 1 TO 3 TABLETS AS NEEDED DAILY    ? digoxin (LANOXIN) 0.125 MG tablet Take one tablet by mouth Monday through Friday.  DO NOT take on Saturday or Sunday. 90 tablet 3  ? ferrous sulfate 325 (65 FE) MG tablet Take 1 tablet (325 mg total) by mouth daily with breakfast. 30 tablet 3  ? memantine (NAMENDA) 10 MG tablet Take 10 mg by mouth 2 (two) times daily.    ? metolazone (ZAROXOLYN) 5 MG tablet Take 30 minutes before Torsemide dose on Thursday (12/15) and Sat (12/17) 2 tablet 0  ? metoprolol succinate (TOPROL-XL) 25 MG 24 hr tablet Take 0.5 tablets by mouth daily.    ? mirabegron ER (MYRBETRIQ) 50 MG TB24 tablet Take 1 tablet (50 mg total) by mouth daily.  30 tablet 3  ? modafinil (PROVIGIL) 100 MG tablet Take 0.5 tablets (50 mg total) by mouth daily. 30 tablet 5  ? Multiple Vitamin (MULTIVITAMIN WITH MINERALS) TABS tablet Take 1 tablet by mouth daily.    ? pantoprazole (PROTONIX) 40 MG tablet Take 1 tablet (40 mg total) by mouth daily. 30 tablet 0  ? rosuvastatin (CRESTOR) 40 MG tablet Take 1 tablet (40 mg total) by mouth daily. 90 tablet 2  ? sertraline (ZOLOFT) 100 MG tablet Take 2 tablets (200 mg total) by mouth daily. 30 tablet 0  ? tamsulosin (FLOMAX) 0.4 MG CAPS capsule Take 1 capsule (0.4 mg total) by mouth at bedtime. 30 capsule 1  ? Teriparatide, Recombinant, 620 MCG/2.48ML SOPN inject 20 mcg Subcutaneous Once a day 1 mL 11  ? torsemide (DEMADEX) 20 MG tablet Take 4 tablets (80 mg total) by mouth 2 (two) times daily. 160 mg BID X1WEEK THEN BACK TO '80MG'$  DAILY (Patient taking differently: Take 30 mg by mouth 2 (two) times daily. 02/14/22 states pcp told to take 1 1/2 tablets daily) 45 tablet 6  ? metoprolol succinate (TOPROL-XL) 50 MG 24 hr tablet Take 1 tablet (50 mg total) by mouth 2 (two) times daily. Take with or immediately following a meal. 180 tablet 3  ? ?No facility-administered medications prior to visit.  ? ? ?PAST MEDICAL HISTORY: ?Past Medical History:  ?Diagnosis Date  ? (HFpEF) heart failure with preserved ejection fraction ([place])   ? a. 05/2013 Echo: EF 55%, mild LVH, diast dysfxn, Ao sclerosis, mildly dil LA, RV dysfxn (poorly visualized), PASP 52mHg;  b. 03/2017 Echo: EF 55-60%, no rwma, triv MR, mildly dil RV, mod TR, PASP 478mg.  ? [facility]  ? s/p Cox Maze 1/09;  Multaq Rx d/c'd in 2014 due to pulmo fibrosis;  coumadin d/c'd in 2014 due to spontaneous subdural hematoma  ? BPH (benign prostatic hyperplasia)   ? CAD (coronary artery disease), native coronary artery   ? a. s/p CABG 12/2007;  b. Myoview 12/2011: EF 66%, no scar or ischemia; normal.  ? DM (diabetes mellitus) ([facility]  ? Hyperlipidemia type II   ? Hypertension   ?  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 OSA (obstructive sleep apnea)   ? Pacemaker   ? PPM - St. Jude  ? Peripheral neuropathy 07/31/2018  ? Pulmonary fibrosis ([facility]  ? Multaq d/c'd 7/14  ? Subdural hematoma 07/2012  ? spontaneous;  coumadin d/c'd => no longer a candidate for anticoagulation  ? ? ?  PAST SURGICAL HISTORY: ?Past Surgical History:  ?Procedure Laterality Date  ? AMPUTATION Left 05/17/2019  ? Procedure: AMPUTATION LEFT FOURTH TOE;  Surgeon: Meredith Pel, MD;  Location: [place];  Service: Orthopedics;  Laterality: Left;  ? AMPUTATION TOE Right 02/06/2020  ? Procedure: AMPUTATION TOE;  Surgeon: Newt Minion, MD;  Location: [place];  Service: Orthopedics;  Laterality: Right;  ? AMPUTATION TOE Right 08/17/2020  ? Procedure: AMPUTATION TOE 4th toe;  Surgeon: Trula Slade, DPM;  [facility];  Service: Podiatry;  Laterality: Right;  ? APPENDECTOMY    ? CHOLECYSTECTOMY    ? CORONARY ARTERY BYPASS GRAFT    ? x3 (left internal mammary artery to distal left anterior descending coronary artery, saphenous vain graft to second circumflex marginal branch, saphenous vain graft to posterior descending coronary artery, endoscopic saphenous vain harvest from right thigh) and modified Cox - Maze IV procedure.  [name],MD. Electronically signed CHO/MEDQ D: 01/09/2008/ JOB: [id number]:  [name]  ? [name]  07/30/2012  ? Procedure: CRANIOTOMY HEMATOMA EVACUATION SUBDURAL;  Surgeon: [name], MD;  Location: [place] [facility];  Service: Neurosurgery;  Laterality: Right;  Right craniotomy for evacuation of subdural hematoma  ? FOOT SURGERY    ? HERNIA REPAIR    ? INTRAMEDULLARY (IM) NAIL INTERTROCHANTERIC Right 02/04/2020  ? Procedure: INTRAMEDULLARY (IM) NAIL INTERTROCHANTRIC;  Surgeon: Netta Cedars, MD;  Location: [place];  Service: Orthopedics;  Laterality: Right;  ? ORCHIECTOMY    ? Left  /  testicular cancer  ? PACEMAKER PLACEMENT    ? PPM - St. Jude  ? PPM  GENERATOR CHANGEOUT N/A 06/25/2019  ? Procedure: PPM GENERATOR CHANGEOUT;  Surgeon: Evans Lance, MD;  Location: [place] [facility];  Service: Cardiovascular;  Laterality: N/A;  ? ? ?FAMILY HISTORY: ?Family

## 2022-03-09 ENCOUNTER — Other Ambulatory Visit: Payer: Self-pay

## 2022-03-09 ENCOUNTER — Ambulatory Visit (INDEPENDENT_AMBULATORY_CARE_PROVIDER_SITE_OTHER): Payer: Medicare Other | Admitting: Cardiology

## 2022-03-09 ENCOUNTER — Encounter: Payer: Self-pay | Admitting: Cardiology

## 2022-03-09 VITALS — BP 120/70 | HR 99 | Ht 68.0 in | Wt 198.8 lb

## 2022-03-09 DIAGNOSIS — R1310 Dysphagia, unspecified: Secondary | ICD-10-CM | POA: Diagnosis not present

## 2022-03-09 DIAGNOSIS — E7800 Pure hypercholesterolemia, unspecified: Secondary | ICD-10-CM

## 2022-03-09 DIAGNOSIS — I071 Rheumatic tricuspid insufficiency: Secondary | ICD-10-CM

## 2022-03-09 DIAGNOSIS — N1831 Chronic kidney disease, stage 3a: Secondary | ICD-10-CM | POA: Diagnosis not present

## 2022-03-09 DIAGNOSIS — E1122 Type 2 diabetes mellitus with diabetic chronic kidney disease: Secondary | ICD-10-CM | POA: Diagnosis not present

## 2022-03-09 DIAGNOSIS — I4819 Other persistent atrial fibrillation: Secondary | ICD-10-CM

## 2022-03-09 DIAGNOSIS — I5032 Chronic diastolic (congestive) heart failure: Secondary | ICD-10-CM | POA: Diagnosis not present

## 2022-03-09 DIAGNOSIS — Z950 Presence of cardiac pacemaker: Secondary | ICD-10-CM

## 2022-03-09 DIAGNOSIS — D631 Anemia in chronic kidney disease: Secondary | ICD-10-CM | POA: Diagnosis not present

## 2022-03-09 DIAGNOSIS — I2510 Atherosclerotic heart disease of native coronary artery without angina pectoris: Secondary | ICD-10-CM

## 2022-03-09 DIAGNOSIS — I5042 Chronic combined systolic (congestive) and diastolic (congestive) heart failure: Secondary | ICD-10-CM | POA: Diagnosis not present

## 2022-03-09 DIAGNOSIS — I130 Hypertensive heart and chronic kidney disease with heart failure and stage 1 through stage 4 chronic kidney disease, or unspecified chronic kidney disease: Secondary | ICD-10-CM | POA: Diagnosis not present

## 2022-03-09 NOTE — Patient Instructions (Signed)

## 2022-03-10 LAB — DIGOXIN LEVEL: Digoxin, Serum: <0.4 ng/mL — ABNORMAL LOW (ref 0.5–0.9)

## 2022-03-10 LAB — BASIC METABOLIC PANEL
BUN/Creatinine Ratio: 13 (ref 10–24)
BUN: 16 mg/dL (ref 8–27)
CO2: 27 mmol/L (ref 20–29)
Calcium: 8.9 mg/dL (ref 8.6–10.2)
Chloride: 105 mmol/L (ref 96–106)
Creatinine, Ser: 1.22 mg/dL (ref 0.76–1.27)
Glucose: 93 mg/dL (ref 70–99)
Potassium: 4.4 mmol/L (ref 3.5–5.2)
Sodium: 143 mmol/L (ref 134–144)
eGFR: 60 mL/min/{1.73_m2} (ref >59)

## 2022-03-13 ENCOUNTER — Encounter: Payer: Self-pay | Admitting: *Deleted

## 2022-03-14 DIAGNOSIS — D631 Anemia in chronic kidney disease: Secondary | ICD-10-CM | POA: Diagnosis not present

## 2022-03-14 DIAGNOSIS — E1122 Type 2 diabetes mellitus with diabetic chronic kidney disease: Secondary | ICD-10-CM | POA: Diagnosis not present

## 2022-03-14 DIAGNOSIS — I130 Hypertensive heart and chronic kidney disease with heart failure and stage 1 through stage 4 chronic kidney disease, or unspecified chronic kidney disease: Secondary | ICD-10-CM | POA: Diagnosis not present

## 2022-03-14 DIAGNOSIS — R1310 Dysphagia, unspecified: Secondary | ICD-10-CM | POA: Diagnosis not present

## 2022-03-14 DIAGNOSIS — Z85828 Personal history of other malignant neoplasm of skin: Secondary | ICD-10-CM | POA: Diagnosis not present

## 2022-03-14 DIAGNOSIS — I5042 Chronic combined systolic (congestive) and diastolic (congestive) heart failure: Secondary | ICD-10-CM | POA: Diagnosis not present

## 2022-03-14 DIAGNOSIS — C44219 Basal cell carcinoma of skin of left ear and external auricular canal: Secondary | ICD-10-CM | POA: Diagnosis not present

## 2022-03-14 DIAGNOSIS — N1831 Chronic kidney disease, stage 3a: Secondary | ICD-10-CM | POA: Diagnosis not present

## 2022-03-15 DIAGNOSIS — E1122 Type 2 diabetes mellitus with diabetic chronic kidney disease: Secondary | ICD-10-CM | POA: Diagnosis not present

## 2022-03-15 DIAGNOSIS — I130 Hypertensive heart and chronic kidney disease with heart failure and stage 1 through stage 4 chronic kidney disease, or unspecified chronic kidney disease: Secondary | ICD-10-CM | POA: Diagnosis not present

## 2022-03-15 DIAGNOSIS — N1831 Chronic kidney disease, stage 3a: Secondary | ICD-10-CM | POA: Diagnosis not present

## 2022-03-15 DIAGNOSIS — D631 Anemia in chronic kidney disease: Secondary | ICD-10-CM | POA: Diagnosis not present

## 2022-03-15 DIAGNOSIS — I5042 Chronic combined systolic (congestive) and diastolic (congestive) heart failure: Secondary | ICD-10-CM | POA: Diagnosis not present

## 2022-03-15 DIAGNOSIS — R1310 Dysphagia, unspecified: Secondary | ICD-10-CM | POA: Diagnosis not present

## 2022-03-17 DIAGNOSIS — Z20822 Contact with and (suspected) exposure to covid-19: Secondary | ICD-10-CM | POA: Diagnosis not present

## 2022-03-21 ENCOUNTER — Ambulatory Visit (INDEPENDENT_AMBULATORY_CARE_PROVIDER_SITE_OTHER): Payer: Medicare Other

## 2022-03-21 DIAGNOSIS — I4819 Other persistent atrial fibrillation: Secondary | ICD-10-CM

## 2022-03-21 LAB — CUP PACEART REMOTE DEVICE CHECK
Battery Remaining Longevity: 91 mo
Battery Remaining Percentage: 77 %
Battery Voltage: 3.01 V
Brady Statistic AP VP Percent: 3 %
Brady Statistic AP VS Percent: 1.1 %
Brady Statistic AS VP Percent: 1 %
Brady Statistic AS VS Percent: 87 %
Brady Statistic RA Percent Paced: 2.9 %
Brady Statistic RV Percent Paced: 3.8 %
Date Time Interrogation Session: 20230404045633
Implantable Lead Implant Date: 20090420
Implantable Lead Implant Date: 20090420
Implantable Lead Location: 753859
Implantable Lead Location: 753860
Implantable Pulse Generator Implant Date: 20200708
Lead Channel Impedance Value: 450 Ohm
Lead Channel Impedance Value: 510 Ohm
Lead Channel Pacing Threshold Amplitude: 0.75 V
Lead Channel Pacing Threshold Amplitude: 1 V
Lead Channel Pacing Threshold Pulse Width: 0.4 ms
Lead Channel Pacing Threshold Pulse Width: 0.4 ms
Lead Channel Sensing Intrinsic Amplitude: 1.3 mV
Lead Channel Sensing Intrinsic Amplitude: 6.6 mV
Lead Channel Setting Pacing Amplitude: 2 V
Lead Channel Setting Pacing Amplitude: 2.5 V
Lead Channel Setting Pacing Pulse Width: 0.4 ms
Lead Channel Setting Sensing Sensitivity: 2 mV
Pulse Gen Model: 2272
Pulse Gen Serial Number: 9149180

## 2022-03-22 ENCOUNTER — Telehealth: Payer: Self-pay | Admitting: *Deleted

## 2022-03-22 DIAGNOSIS — D631 Anemia in chronic kidney disease: Secondary | ICD-10-CM | POA: Diagnosis not present

## 2022-03-22 DIAGNOSIS — N1831 Chronic kidney disease, stage 3a: Secondary | ICD-10-CM | POA: Diagnosis not present

## 2022-03-22 DIAGNOSIS — I5042 Chronic combined systolic (congestive) and diastolic (congestive) heart failure: Secondary | ICD-10-CM | POA: Diagnosis not present

## 2022-03-22 DIAGNOSIS — I130 Hypertensive heart and chronic kidney disease with heart failure and stage 1 through stage 4 chronic kidney disease, or unspecified chronic kidney disease: Secondary | ICD-10-CM | POA: Diagnosis not present

## 2022-03-22 DIAGNOSIS — E1122 Type 2 diabetes mellitus with diabetic chronic kidney disease: Secondary | ICD-10-CM | POA: Diagnosis not present

## 2022-03-22 DIAGNOSIS — R1310 Dysphagia, unspecified: Secondary | ICD-10-CM | POA: Diagnosis not present

## 2022-03-22 NOTE — Telephone Encounter (Signed)
Tried to intiate PA for modifinil on CMM and was not able to complete.  I called pt / wife and they gave me the info for insurance # [id number] ID # [id number].   I called today spoke to mark and was able to get approval until 09-21-2022 PA REF # [id number].  Will fax approval notice.   ?

## 2022-03-23 ENCOUNTER — Encounter: Payer: Medicare Other | Admitting: Internal Medicine

## 2022-03-23 NOTE — Telephone Encounter (Signed)
I called pt and relayed that approval by [employer] Rx [phone] [facility]  GG/CW.   ?

## 2022-03-27 DIAGNOSIS — R1310 Dysphagia, unspecified: Secondary | ICD-10-CM | POA: Diagnosis not present

## 2022-03-31 DIAGNOSIS — R1310 Dysphagia, unspecified: Secondary | ICD-10-CM | POA: Diagnosis not present

## 2022-03-31 DIAGNOSIS — I5042 Chronic combined systolic (congestive) and diastolic (congestive) heart failure: Secondary | ICD-10-CM | POA: Diagnosis not present

## 2022-03-31 DIAGNOSIS — N1831 Chronic kidney disease, stage 3a: Secondary | ICD-10-CM | POA: Diagnosis not present

## 2022-03-31 DIAGNOSIS — I130 Hypertensive heart and chronic kidney disease with heart failure and stage 1 through stage 4 chronic kidney disease, or unspecified chronic kidney disease: Secondary | ICD-10-CM | POA: Diagnosis not present

## 2022-03-31 DIAGNOSIS — D631 Anemia in chronic kidney disease: Secondary | ICD-10-CM | POA: Diagnosis not present

## 2022-03-31 DIAGNOSIS — E1122 Type 2 diabetes mellitus with diabetic chronic kidney disease: Secondary | ICD-10-CM | POA: Diagnosis not present

## 2022-04-03 ENCOUNTER — Ambulatory Visit: Payer: Medicare Other | Admitting: Podiatry

## 2022-04-03 ENCOUNTER — Ambulatory Visit (INDEPENDENT_AMBULATORY_CARE_PROVIDER_SITE_OTHER): Payer: Medicare Other | Admitting: Podiatry

## 2022-04-03 DIAGNOSIS — M79674 Pain in right toe(s): Secondary | ICD-10-CM | POA: Diagnosis not present

## 2022-04-03 DIAGNOSIS — B351 Tinea unguium: Secondary | ICD-10-CM

## 2022-04-03 DIAGNOSIS — L84 Corns and callosities: Secondary | ICD-10-CM

## 2022-04-03 DIAGNOSIS — E1149 Type 2 diabetes mellitus with other diabetic neurological complication: Secondary | ICD-10-CM | POA: Diagnosis not present

## 2022-04-03 DIAGNOSIS — M79675 Pain in left toe(s): Secondary | ICD-10-CM

## 2022-04-04 NOTE — Progress Notes (Signed)
Remote pacemaker transmission.   

## 2022-04-05 NOTE — Progress Notes (Signed)
Subjective: ?[name]. is a 81 y.o. is seen today for foot exam and for thick, elongated toes that he cannot trim himself.  No open lesions. States he did have a fall last week, no injury. He states he was in the kitchen and did not have his cane or walker. He recently just finished PT to help. He has no other concerns today.   ? ?[name], MD ? ?Objective: ?General: No acute distress ?DP/PT pulses palpable 2/4, CRT < 3 sec to all digits.  ?Sensation decreased with Thornell Mule monofilament ?The nails appear to be hypertrophic, dystrophic to the left 1, 3, 5 in the right 1, 3, 5.  No edema, erythema or signs of infection of the toenail sites noted today. ?There are preulcerative calluses noted on the right foot submetatarsal 1, left hallux, left third toe. No underlying ulceration, drainage, signs of infection.  ?Hammertoes present.  ?No pain with calf compression, swelling, warmth, erythema.  ? ? ?Assessment and Plan:  ?Daily foot exam, symptomatic onychomycosis, dry skin, preulcerative area right third toe hammertoes present. ? ?-Treatment options discussed including all alternatives, risks, and complications ?-Discussed the importance of daily foot inspection proper shoe gear. ?-Sharply debrided nails x 6 without any complications or bleeding ?-Sharply debrided hyperkeratotic lesions x3 without any complications or bleeding.  No signs of infection noted today. ?-Continue offloading ?-Daily foot inspection.  ?-Monitor for any clinical signs or symptoms of infection and directed to call the office immediately should any occur or go to the ER. ? ?Return in about 9 weeks (around 06/05/2022). ? ?Trula Slade DPM ? ?

## 2022-04-12 DIAGNOSIS — E7800 Pure hypercholesterolemia, unspecified: Secondary | ICD-10-CM | POA: Diagnosis not present

## 2022-04-12 DIAGNOSIS — I5032 Chronic diastolic (congestive) heart failure: Secondary | ICD-10-CM | POA: Diagnosis not present

## 2022-04-12 DIAGNOSIS — M109 Gout, unspecified: Secondary | ICD-10-CM | POA: Diagnosis not present

## 2022-04-12 DIAGNOSIS — Z8673 Personal history of transient ischemic attack (TIA), and cerebral infarction without residual deficits: Secondary | ICD-10-CM | POA: Diagnosis not present

## 2022-04-12 DIAGNOSIS — G4719 Other hypersomnia: Secondary | ICD-10-CM | POA: Diagnosis not present

## 2022-04-12 DIAGNOSIS — D508 Other iron deficiency anemias: Secondary | ICD-10-CM | POA: Diagnosis not present

## 2022-04-12 DIAGNOSIS — I4819 Other persistent atrial fibrillation: Secondary | ICD-10-CM | POA: Diagnosis not present

## 2022-04-19 DIAGNOSIS — Z20822 Contact with and (suspected) exposure to covid-19: Secondary | ICD-10-CM | POA: Diagnosis not present

## 2022-04-20 DIAGNOSIS — Z20822 Contact with and (suspected) exposure to covid-19: Secondary | ICD-10-CM | POA: Diagnosis not present

## 2022-04-21 DIAGNOSIS — Z20822 Contact with and (suspected) exposure to covid-19: Secondary | ICD-10-CM | POA: Diagnosis not present

## 2022-04-26 DIAGNOSIS — I10 Essential (primary) hypertension: Secondary | ICD-10-CM | POA: Diagnosis not present

## 2022-05-05 DIAGNOSIS — D509 Iron deficiency anemia, unspecified: Secondary | ICD-10-CM | POA: Diagnosis not present

## 2022-05-09 ENCOUNTER — Other Ambulatory Visit: Payer: Self-pay

## 2022-05-09 DIAGNOSIS — D509 Iron deficiency anemia, unspecified: Secondary | ICD-10-CM

## 2022-05-10 ENCOUNTER — Inpatient Hospital Stay: Payer: Medicare Other | Attending: Hematology and Oncology

## 2022-05-10 ENCOUNTER — Other Ambulatory Visit: Payer: Self-pay

## 2022-05-10 DIAGNOSIS — D509 Iron deficiency anemia, unspecified: Secondary | ICD-10-CM | POA: Diagnosis not present

## 2022-05-10 DIAGNOSIS — Z923 Personal history of irradiation: Secondary | ICD-10-CM | POA: Insufficient documentation

## 2022-05-10 DIAGNOSIS — M810 Age-related osteoporosis without current pathological fracture: Secondary | ICD-10-CM | POA: Diagnosis not present

## 2022-05-10 DIAGNOSIS — D472 Monoclonal gammopathy: Secondary | ICD-10-CM | POA: Diagnosis not present

## 2022-05-10 DIAGNOSIS — D696 Thrombocytopenia, unspecified: Secondary | ICD-10-CM | POA: Insufficient documentation

## 2022-05-10 DIAGNOSIS — Z8547 Personal history of malignant neoplasm of testis: Secondary | ICD-10-CM | POA: Insufficient documentation

## 2022-05-10 LAB — COMPREHENSIVE METABOLIC PANEL
ALT: 19 U/L (ref 0–44)
AST: 28 U/L (ref 15–41)
Albumin: 4.1 g/dL (ref 3.5–5.0)
Alkaline Phosphatase: 85 U/L (ref 38–126)
Anion gap: 7 (ref 5–15)
BUN: 22 mg/dL (ref 8–23)
CO2: 30 mmol/L (ref 22–32)
Calcium: 9.1 mg/dL (ref 8.9–10.3)
Chloride: 107 mmol/L (ref 98–111)
Creatinine, Ser: 1.41 mg/dL — ABNORMAL HIGH (ref 0.61–1.24)
GFR, Estimated: 51 mL/min — ABNORMAL LOW (ref >60)
Glucose, Bld: 130 mg/dL — ABNORMAL HIGH (ref 70–99)
Potassium: 3.2 mmol/L — ABNORMAL LOW (ref 3.5–5.1)
Sodium: 144 mmol/L (ref 135–145)
Total Bilirubin: 0.6 mg/dL (ref 0.3–1.2)
Total Protein: 7.7 g/dL (ref 6.5–8.1)

## 2022-05-10 LAB — CBC WITH DIFFERENTIAL/PLATELET
Abs Immature Granulocytes: 0.01 10*3/uL (ref 0.00–0.07)
Basophils Absolute: 0 10*3/uL (ref 0.0–0.1)
Basophils Relative: 1 %
Eosinophils Absolute: 0.1 10*3/uL (ref 0.0–0.5)
Eosinophils Relative: 4 %
HCT: 30.5 % — ABNORMAL LOW (ref 39.0–52.0)
Hemoglobin: 10.1 g/dL — ABNORMAL LOW (ref 13.0–17.0)
Immature Granulocytes: 0 %
Lymphocytes Relative: 18 %
Lymphs Abs: 0.7 10*3/uL (ref 0.7–4.0)
MCH: 30.1 pg (ref 26.0–34.0)
MCHC: 33.1 g/dL (ref 30.0–36.0)
MCV: 90.8 fL (ref 80.0–100.0)
Monocytes Absolute: 0.2 10*3/uL (ref 0.1–1.0)
Monocytes Relative: 4 %
Neutro Abs: 2.9 10*3/uL (ref 1.7–7.7)
Neutrophils Relative %: 73 %
Platelets: 87 10*3/uL — ABNORMAL LOW (ref 150–400)
RBC: 3.36 MIL/uL — ABNORMAL LOW (ref 4.22–5.81)
RDW: 16.4 % — ABNORMAL HIGH (ref 11.5–15.5)
WBC: 3.9 10*3/uL — ABNORMAL LOW (ref 4.0–10.5)
nRBC: 0 % (ref 0.0–0.2)

## 2022-05-10 LAB — IRON AND IRON BINDING CAPACITY (CC-WL,HP ONLY)
Iron: 42 ug/dL — ABNORMAL LOW (ref 45–182)
Saturation Ratios: 12 % — ABNORMAL LOW (ref 17.9–39.5)
TIBC: 343 ug/dL (ref 250–450)
UIBC: 301 ug/dL (ref 117–376)

## 2022-05-10 LAB — FERRITIN: Ferritin: 44 ng/mL (ref 24–336)

## 2022-05-11 LAB — KAPPA/LAMBDA LIGHT CHAINS
Kappa free light chain: 90.3 mg/L — ABNORMAL HIGH (ref 3.3–19.4)
Kappa, lambda light chain ratio: 2.92 — ABNORMAL HIGH (ref 0.26–1.65)
Lambda free light chains: 30.9 mg/L — ABNORMAL HIGH (ref 5.7–26.3)

## 2022-05-16 ENCOUNTER — Other Ambulatory Visit: Payer: Self-pay | Admitting: *Deleted

## 2022-05-16 LAB — MULTIPLE MYELOMA PANEL, SERUM
Albumin SerPl Elph-Mcnc: 3.7 g/dL (ref 2.9–4.4)
Albumin/Glob SerPl: 1.1 (ref 0.7–1.7)
Alpha 1: 0.3 g/dL (ref 0.0–0.4)
Alpha2 Glob SerPl Elph-Mcnc: 0.7 g/dL (ref 0.4–1.0)
B-Globulin SerPl Elph-Mcnc: 1.2 g/dL (ref 0.7–1.3)
Gamma Glob SerPl Elph-Mcnc: 1.3 g/dL (ref 0.4–1.8)
Globulin, Total: 3.5 g/dL (ref 2.2–3.9)
IgA: 611 mg/dL — ABNORMAL HIGH (ref 61–437)
IgG (Immunoglobin G), Serum: 1274 mg/dL (ref 603–1613)
IgM (Immunoglobulin M), Srm: 85 mg/dL (ref 15–143)
M Protein SerPl Elph-Mcnc: 0.5 g/dL — ABNORMAL HIGH
Total Protein ELP: 7.2 g/dL (ref 6.0–8.5)

## 2022-05-16 MED ORDER — EZETIMIBE 10 MG PO TABS: 10.0000 mg | ORAL_TABLET | Freq: Every day | ORAL | 3 refills | Status: AC

## 2022-05-16 NOTE — Progress Notes (Signed)
a 

## 2022-05-18 ENCOUNTER — Inpatient Hospital Stay: Payer: Medicare Other | Attending: Hematology and Oncology | Admitting: Hematology and Oncology

## 2022-05-18 ENCOUNTER — Encounter: Payer: Self-pay | Admitting: Hematology and Oncology

## 2022-05-18 ENCOUNTER — Other Ambulatory Visit: Payer: Self-pay

## 2022-05-18 VITALS — BP 109/53 | HR 57 | Resp 18 | Ht 68.0 in | Wt 200.6 lb

## 2022-05-18 DIAGNOSIS — D696 Thrombocytopenia, unspecified: Secondary | ICD-10-CM | POA: Diagnosis not present

## 2022-05-18 DIAGNOSIS — D539 Nutritional anemia, unspecified: Secondary | ICD-10-CM | POA: Diagnosis not present

## 2022-05-18 DIAGNOSIS — D509 Iron deficiency anemia, unspecified: Secondary | ICD-10-CM | POA: Diagnosis not present

## 2022-05-18 DIAGNOSIS — N189 Chronic kidney disease, unspecified: Secondary | ICD-10-CM | POA: Insufficient documentation

## 2022-05-18 DIAGNOSIS — D472 Monoclonal gammopathy: Secondary | ICD-10-CM | POA: Insufficient documentation

## 2022-05-18 DIAGNOSIS — E611 Iron deficiency: Secondary | ICD-10-CM | POA: Insufficient documentation

## 2022-05-18 DIAGNOSIS — D631 Anemia in chronic kidney disease: Secondary | ICD-10-CM | POA: Insufficient documentation

## 2022-05-18 DIAGNOSIS — R162 Hepatomegaly with splenomegaly, not elsewhere classified: Secondary | ICD-10-CM | POA: Diagnosis not present

## 2022-05-18 NOTE — Assessment & Plan Note (Signed)
The patient has multifactorial anemia, combination of anemia chronic kidney disease and iron deficiency We will continue to observe and monitor closely His last vitamin B12 level was adequate, I plan to recheck next year He is instructed to take oral iron supplement 30 minutes before his meals

## 2022-05-18 NOTE — Assessment & Plan Note (Signed)
His recent myeloma panel was just borderline abnormal Overall, his studies are stable He will continue [name] blood work in 1 year

## 2022-05-18 NOTE — Progress Notes (Signed)
[facility] OFFICE PROGRESS NOTE  Patient Care Team: [name], [name], [name], [name], [name], DO as Referring Physician (Family Medicine)  ASSESSMENT & PLAN:  Deficiency anemia The patient has multifactorial anemia, combination of anemia chronic kidney disease and iron deficiency We will continue to observe and monitor closely His last vitamin B12 level was adequate, I plan to recheck next year He is instructed to take oral iron supplement 30 minutes before his meals  [name] (monoclonal gammopathy of unknown significance) His recent myeloma panel was just borderline abnormal Overall, his studies are stable He will continue [name] blood work in 1 year  Thrombocytopenia ([place]) 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 Even though the CT imaging report from September 2017 did not disclose this, I reviewed the imaging study with the patient extensively which clearly showed mild liver enlargement and splenomegaly. Rarely, autoimmune disorder that could cause pulmonary fibrosis can also cause mild thrombocytopenia. He is not symptomatic.  Previous autoimmune screen, hepatitis C screening and serum vitamin B12 were within normal limits Observe only for now His platelet count is stable  Orders Placed This Encounter  Procedures   CMP ([facility] only)    Standing Status:   Future    Standing Expiration Date:   05/19/2023   CBC with Differential ([facility] Only)    Standing Status:   Future    Standing Expiration Date:   05/19/2023   Iron and Iron Binding Capacity ([facility] only)    Standing Status:   Future    Standing Expiration Date:   05/19/2023   Ferritin    Standing Status:   Future    Standing Expiration Date:   05/18/2023   Vitamin B12    Standing Status:   Future     Standing Expiration Date:   05/19/2023   Kappa/lambda light chains    Standing Status:   Future    Standing Expiration Date:   05/19/2023   Multiple Myeloma Panel (SPEP&IFE w/QIG)    Standing Status:   Future    Standing Expiration Date:   05/19/2023    All questions were answered. The patient knows to call the clinic with any problems, questions or concerns. The total time spent in the appointment was 20 minutes encounter with patients including review of chart and various tests results, discussions about plan of care and coordination of care plan   [name], MD 05/18/2022 10:22 AM  INTERVAL HISTORY: Please see below for problem oriented charting. he returns for surveillance follow-up for [name] and chronic pancytopenia He had multiple falls over the years and have seen multiple neurologist No major injuries from his falls He bruises easily but no spontaneous bleeding Denies recent infection He typically takes oral iron supplement with breakfast  REVIEW OF SYSTEMS:   Constitutional: Denies fevers, chills or abnormal weight loss Eyes: Denies blurriness of vision Ears, nose, mouth, throat, and face: Denies mucositis or sore throat Respiratory: Denies cough, dyspnea or wheezes Cardiovascular: Denies palpitation, chest discomfort or lower extremity swelling Gastrointestinal:  Denies nausea, heartburn or change in bowel habits Skin: Denies abnormal skin rashes Lymphatics: Denies new lymphadenopathy Behavioral/Psych: Mood is stable, no new changes  All other systems were reviewed with the patient and are negative.  I have reviewed the past medical history, past surgical history, social history and  family history with the patient and they are unchanged from previous note.  ALLERGIES:  is allergic to aricept [donepezil], baclofen, lipitor [atorvastatin], nsaids, warfarin and related, and enbrel [etanercept].  MEDICATIONS:  Current Outpatient Medications  Medication Sig Dispense Refill    allopurinol (ZYLOPRIM) 300 MG tablet Take 1 tablet (300 mg total) by mouth daily. 30 tablet 0   colchicine 0.6 MG tablet TAKE 1 TO 3 TABLETS AS NEEDED DAILY     digoxin (LANOXIN) 0.125 MG tablet Take one tablet by mouth Monday through Friday.  DO NOT take on Saturday or Sunday. 90 tablet 3   ezetimibe (ZETIA) 10 MG tablet Take 1 tablet (10 mg total) by mouth daily. 90 tablet 3   ferrous sulfate 325 (65 FE) MG tablet Take 1 tablet (325 mg total) by mouth daily with breakfast. 30 tablet 3   memantine (NAMENDA) 10 MG tablet Take 10 mg by mouth 2 (two) times daily.     metolazone (ZAROXOLYN) 5 MG tablet Take 30 minutes before Torsemide dose on Thursday (12/15) and Sat (12/17) (Patient not taking: Reported on 03/09/2022) 2 tablet 0   metoprolol succinate (TOPROL-XL) 25 MG 24 hr tablet Take 0.5 tablets by mouth daily.     mirabegron ER (MYRBETRIQ) 50 MG TB24 tablet Take 1 tablet (50 mg total) by mouth daily. 30 tablet 3   modafinil (PROVIGIL) 100 MG tablet Take 1 tablet (100 mg total) by mouth daily. 30 tablet 5   Multiple Vitamin (MULTIVITAMIN WITH MINERALS) TABS tablet Take 1 tablet by mouth daily.     pantoprazole (PROTONIX) 40 MG tablet Take 1 tablet (40 mg total) by mouth daily. 30 tablet 0   rosuvastatin (CRESTOR) 40 MG tablet Take 1 tablet (40 mg total) by mouth daily. 90 tablet 2   sertraline (ZOLOFT) 100 MG tablet Take 2 tablets (200 mg total) by mouth daily. 30 tablet 0   tamsulosin (FLOMAX) 0.4 MG CAPS capsule Take 1 capsule (0.4 mg total) by mouth at bedtime. 30 capsule 1   Teriparatide, Recombinant, 620 MCG/2.48ML SOPN inject 20 mcg Subcutaneous Once a day (Patient taking differently: Takes injection twice a year.) 1 mL 11   torsemide (DEMADEX) 20 MG tablet Take 4 tablets (80 mg total) by mouth 2 (two) times daily. 160 mg BID X1WEEK THEN BACK TO 80MG DAILY (Patient taking differently: Take 30 mg by mouth 2 (two) times daily. 02/14/22 states pcp told to take 1 1/2 tablets daily) 45 tablet 6    No current facility-administered medications for this visit.    SUMMARY OF ONCOLOGIC HISTORY: Oncology History   No history exists.    PHYSICAL EXAMINATION: ECOG PERFORMANCE STATUS: 1 - Symptomatic but completely ambulatory  Vitals:   05/18/22 0958  BP: (!) 109/53  Pulse: (!) 57  Resp: 18  SpO2: 95%   Filed Weights   05/18/22 0958  Weight: 200 lb 9.6 oz (91 kg)    GENERAL:alert, no distress and comfortable SKIN: Noted skin bruising   LABORATORY DATA:  I have reviewed the data as listed    Component Value Date/Time   NA 144 05/10/2022 0917   NA 143 03/09/2022 0934   NA 141 02/23/2017 1237   K 3.2 (L) 05/10/2022 0917   K 3.8 02/23/2017 1237   CL 107 05/10/2022 0917   CO2 30 05/10/2022 0917   CO2 27 02/23/2017 1237   GLUCOSE 130 (H) 05/10/2022 0917   GLUCOSE 113 02/23/2017 1237   BUN 22 05/10/2022 0917   BUN 16 03/09/2022 0934  BUN 18.3 02/23/2017 1237   CREATININE 1.41 (H) 05/10/2022 0917   CREATININE 1.2 02/23/2017 1237   CALCIUM 9.1 05/10/2022 0917   CALCIUM 9.6 02/23/2017 1237   PROT 7.7 05/10/2022 0917   PROT 7.3 06/18/2018 1205   PROT 8.0 02/23/2017 1237   ALBUMIN 4.1 05/10/2022 0917   ALBUMIN 4.1 09/24/2017 0824   ALBUMIN 4.1 02/23/2017 1237   AST 28 05/10/2022 0917   AST 24 02/23/2017 1237   ALT 19 05/10/2022 0917   ALT 15 02/23/2017 1237   ALKPHOS 85 05/10/2022 0917   ALKPHOS 105 02/23/2017 1237   BILITOT 0.6 05/10/2022 0917   BILITOT 0.6 09/24/2017 0824   BILITOT 0.98 02/23/2017 1237   GFRNONAA 51 (L) 05/10/2022 0917   GFRAA 72 02/01/2021 1410    No results found for: SPEP, UPEP  Lab Results  Component Value Date   WBC 3.9 (L) 05/10/2022   NEUTROABS 2.9 05/10/2022   HGB 10.1 (L) 05/10/2022   HCT 30.5 (L) 05/10/2022   MCV 90.8 05/10/2022   PLT 87 (L) 05/10/2022      Chemistry      Component Value Date/Time   NA 144 05/10/2022 0917   NA 143 03/09/2022 0934   NA 141 02/23/2017 1237   K 3.2 (L) 05/10/2022 0917   K 3.8  02/23/2017 1237   CL 107 05/10/2022 0917   CO2 30 05/10/2022 0917   CO2 27 02/23/2017 1237   BUN 22 05/10/2022 0917   BUN 16 03/09/2022 0934   BUN 18.3 02/23/2017 1237   CREATININE 1.41 (H) 05/10/2022 0917   CREATININE 1.2 02/23/2017 1237      Component Value Date/Time   CALCIUM 9.1 05/10/2022 0917   CALCIUM 9.6 02/23/2017 1237   ALKPHOS 85 05/10/2022 0917   ALKPHOS 105 02/23/2017 1237   AST 28 05/10/2022 0917   AST 24 02/23/2017 1237   ALT 19 05/10/2022 0917   ALT 15 02/23/2017 1237   BILITOT 0.6 05/10/2022 0917   BILITOT 0.6 09/24/2017 0824   BILITOT 0.98 02/23/2017 1237

## 2022-05-18 NOTE — Assessment & Plan Note (Signed)
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 Even though the CT imaging report from September 2017 did not disclose this, I reviewed the imaging study with the patient extensively which clearly showed mild liver enlargement and splenomegaly. Rarely, autoimmune disorder that could cause pulmonary fibrosis can also cause mild thrombocytopenia. He is not symptomatic.  Previous autoimmune screen, hepatitis C screening and serum vitamin B12 were within normal limits Observe only for now His platelet count is stable

## 2022-05-23 DIAGNOSIS — I10 Essential (primary) hypertension: Secondary | ICD-10-CM | POA: Diagnosis not present

## 2022-06-05 ENCOUNTER — Ambulatory Visit (INDEPENDENT_AMBULATORY_CARE_PROVIDER_SITE_OTHER): Payer: Medicare Other | Admitting: Podiatry

## 2022-06-05 DIAGNOSIS — M79674 Pain in right toe(s): Secondary | ICD-10-CM | POA: Diagnosis not present

## 2022-06-05 DIAGNOSIS — M79675 Pain in left toe(s): Secondary | ICD-10-CM

## 2022-06-05 DIAGNOSIS — B351 Tinea unguium: Secondary | ICD-10-CM | POA: Diagnosis not present

## 2022-06-05 DIAGNOSIS — E1149 Type 2 diabetes mellitus with other diabetic neurological complication: Secondary | ICD-10-CM | POA: Diagnosis not present

## 2022-06-07 NOTE — Progress Notes (Signed)
Subjective: [name]. is a 81 y.o. is seen today for foot exam and for thick, elongated toes that he cannot trim himself.  No open lesions.  No open lesions he reports he has no other concerns.  Jamie Pretty, MD  Objective: General: No acute distress DP/PT pulses palpable 2/4, CRT < 3 sec to all digits.  Sensation decreased with Semmes Weinstein monofilament The nails appear to be hypertrophic, dystrophic to the left 1, 3, 5 in the right 1, 3, 5.  No edema, erythema or signs of infection of the toenail sites noted today. Calluses are minimal today.  There is small scab on the dorsal right hallux with no edema, erythema or signs of infection. Hammertoes present.  No pain with calf compression, swelling, warmth, erythema.    Assessment and Plan:  Daily foot exam, symptomatic onychomycosis, dry skin, preulcerative area right third toe hammertoes present.  -Treatment options discussed including all alternatives, risks, and complications -Discussed the importance of daily foot inspection proper shoe gear. -Sharply debrided nails x 6 without any complications or bleeding -Continue moisturizer for calluses.  They were minimal today. -Monitor area of the scab on the right hallux.  Monitor for any signs or symptoms of infection.  Currently I am not able to appreciate any.  He is not sure how this started.  Continue offloading. -Daily foot inspection.  -Monitor for any clinical signs or symptoms of infection and directed to call the office immediately should any occur or go to the ER.  Trula Slade DPM

## 2022-06-21 ENCOUNTER — Ambulatory Visit (INDEPENDENT_AMBULATORY_CARE_PROVIDER_SITE_OTHER): Payer: Medicare Other

## 2022-06-21 DIAGNOSIS — Z950 Presence of cardiac pacemaker: Secondary | ICD-10-CM

## 2022-06-22 LAB — CUP PACEART REMOTE DEVICE CHECK
Battery Remaining Longevity: 88 mo
Battery Remaining Percentage: 74 %
Battery Voltage: 3.01 V
Brady Statistic AP VP Percent: 6.1 %
Brady Statistic AP VS Percent: 2.5 %
Brady Statistic AS VP Percent: 1.3 %
Brady Statistic AS VS Percent: 82 %
Brady Statistic RA Percent Paced: 4.5 %
Brady Statistic RV Percent Paced: 6.1 %
Date Time Interrogation Session: 20230704054200
Implantable Lead Implant Date: 20090420
Implantable Lead Implant Date: 20090420
Implantable Lead Location: 753859
Implantable Lead Location: 753860
Implantable Pulse Generator Implant Date: 20200708
Lead Channel Impedance Value: 430 Ohm
Lead Channel Impedance Value: 460 Ohm
Lead Channel Pacing Threshold Amplitude: 0.75 V
Lead Channel Pacing Threshold Amplitude: 1 V
Lead Channel Pacing Threshold Pulse Width: 0.4 ms
Lead Channel Pacing Threshold Pulse Width: 0.4 ms
Lead Channel Sensing Intrinsic Amplitude: 1 mV
Lead Channel Sensing Intrinsic Amplitude: 5.9 mV
Lead Channel Setting Pacing Amplitude: 2 V
Lead Channel Setting Pacing Amplitude: 2.5 V
Lead Channel Setting Pacing Pulse Width: 0.4 ms
Lead Channel Setting Sensing Sensitivity: 2 mV
Pulse Gen Model: 2272
Pulse Gen Serial Number: 9149180

## 2022-06-28 DIAGNOSIS — D509 Iron deficiency anemia, unspecified: Secondary | ICD-10-CM | POA: Diagnosis not present

## 2022-07-12 NOTE — Progress Notes (Signed)
Remote pacemaker transmission.   

## 2022-07-20 DIAGNOSIS — E119 Type 2 diabetes mellitus without complications: Secondary | ICD-10-CM | POA: Diagnosis not present

## 2022-07-20 DIAGNOSIS — Z125 Encounter for screening for malignant neoplasm of prostate: Secondary | ICD-10-CM | POA: Diagnosis not present

## 2022-07-20 DIAGNOSIS — D696 Thrombocytopenia, unspecified: Secondary | ICD-10-CM | POA: Diagnosis not present

## 2022-07-21 ENCOUNTER — Emergency Department (HOSPITAL_BASED_OUTPATIENT_CLINIC_OR_DEPARTMENT_OTHER)
Admission: EM | Admit: 2022-07-21 | Discharge: 2022-07-21 | Disposition: A | Discharge status: Home / Self Care | Payer: Medicare Other | Attending: Emergency Medicine | Admitting: Emergency Medicine

## 2022-07-21 ENCOUNTER — Encounter (HOSPITAL_BASED_OUTPATIENT_CLINIC_OR_DEPARTMENT_OTHER): Payer: Self-pay | Admitting: Emergency Medicine

## 2022-07-21 ENCOUNTER — Other Ambulatory Visit: Payer: Self-pay

## 2022-07-21 DIAGNOSIS — J45909 Unspecified asthma, uncomplicated: Secondary | ICD-10-CM | POA: Insufficient documentation

## 2022-07-21 DIAGNOSIS — I10 Essential (primary) hypertension: Secondary | ICD-10-CM | POA: Diagnosis not present

## 2022-07-21 DIAGNOSIS — Y92009 Unspecified place in unspecified non-institutional (private) residence as the place of occurrence of the external cause: Secondary | ICD-10-CM | POA: Insufficient documentation

## 2022-07-21 DIAGNOSIS — Z79899 Other long term (current) drug therapy: Secondary | ICD-10-CM | POA: Insufficient documentation

## 2022-07-21 DIAGNOSIS — W1839XA Other fall on same level, initial encounter: Secondary | ICD-10-CM | POA: Insufficient documentation

## 2022-07-21 DIAGNOSIS — I4891 Unspecified atrial fibrillation: Secondary | ICD-10-CM | POA: Insufficient documentation

## 2022-07-21 DIAGNOSIS — S51019A Laceration without foreign body of unspecified elbow, initial encounter: Secondary | ICD-10-CM

## 2022-07-21 DIAGNOSIS — S51012A Laceration without foreign body of left elbow, initial encounter: Secondary | ICD-10-CM | POA: Insufficient documentation

## 2022-07-21 DIAGNOSIS — S59902A Unspecified injury of left elbow, initial encounter: Secondary | ICD-10-CM | POA: Diagnosis present

## 2022-07-21 MED ORDER — BACITRACIN ZINC 500 UNIT/GM EX OINT: TOPICAL_OINTMENT | Freq: Two times a day (BID) | CUTANEOUS | Status: DC

## 2022-07-21 NOTE — ED Triage Notes (Signed)
Pt c/o falling tonight and scraping his arms on the floor. Pt denies hitting head, not on thinners

## 2022-07-21 NOTE — ED Provider Notes (Signed)
[facility] EMERGENCY DEPT Provider Note   CSN: [id number] Arrival date & time: 07/21/22  0246     History  Chief Complaint  Patient presents with   Billey Co. is a 81 y.o. male.  The history is provided by the patient and the spouse.  Fall  He has history of hypertension, diabetes, hyperlipidemia, atrial fibrillation not anticoagulated because of history of subdural hematoma, heart failure with preserved ejection fraction, pulmonary fibrosis.  He slipped and fell at home landing on his elbows, suffering skin tears.  He is not on any anticoagulants.  He denies head or neck injury.  He denies back, chest, abdomen injury.   Home Medications Prior to Admission medications   Medication Sig Start Date End Date Taking? Authorizing Provider  allopurinol (ZYLOPRIM) 300 MG tablet Take 1 tablet (300 mg total) by mouth daily. 02/24/20   Angiulli, Lavon Paganini, PA-C  colchicine 0.6 MG tablet TAKE 1 TO 3 TABLETS AS NEEDED DAILY    [provider]  digoxin (LANOXIN) 0.125 MG tablet Take one tablet by mouth Monday through Friday.  DO NOT take on Saturday or Sunday. 12/07/21   Evans Lance, MD  ezetimibe (ZETIA) 10 MG tablet Take 1 tablet (10 mg total) by mouth daily. 05/16/22   [name], MD  ferrous sulfate 325 (65 FE) MG tablet Take 1 tablet (325 mg total) by mouth daily with breakfast. 02/24/20   Angiulli, Lavon Paganini, PA-C  memantine (NAMENDA) 10 MG tablet Take 10 mg by mouth 2 (two) times daily. 08/29/20   [provider]  metolazone (ZAROXOLYN) 5 MG tablet Take 30 minutes before Torsemide dose on Thursday (12/15) and Sat (12/17) Patient not taking: Reported on 03/09/2022 11/30/21   Tobb, Kardie, DO  metoprolol succinate (TOPROL-XL) 25 MG 24 hr tablet Take 0.5 tablets by mouth daily.    [provider]  mirabegron ER (MYRBETRIQ) 50 MG TB24 tablet Take 1 tablet (50 mg total) by mouth daily. 02/24/20   Angiulli, Lavon Paganini, PA-C  modafinil  (PROVIGIL) 100 MG tablet Take 1 tablet (100 mg total) by mouth daily. 03/08/22   Ward Givens, NP  Multiple Vitamin (MULTIVITAMIN WITH MINERALS) TABS tablet Take 1 tablet by mouth daily. 02/10/20   Guilford Shi, MD  pantoprazole (PROTONIX) 40 MG tablet Take 1 tablet (40 mg total) by mouth daily. 02/24/20   Angiulli, Lavon Paganini, PA-C  rosuvastatin (CRESTOR) 40 MG tablet Take 1 tablet (40 mg total) by mouth daily. 02/24/20   Angiulli, Lavon Paganini, PA-C  sertraline (ZOLOFT) 100 MG tablet Take 2 tablets (200 mg total) by mouth daily. 02/24/20   Angiulli, Lavon Paganini, PA-C  tamsulosin (FLOMAX) 0.4 MG CAPS capsule Take 1 capsule (0.4 mg total) by mouth at bedtime. 02/24/20   Angiulli, Lavon Paganini, PA-C  Teriparatide, Recombinant, 620 MCG/2.48ML SOPN inject 20 mcg Subcutaneous Once a day Patient taking differently: Takes injection twice a year. 04/11/21     torsemide (DEMADEX) 20 MG tablet Take 4 tablets (80 mg total) by mouth 2 (two) times daily. 160 mg BID X1WEEK THEN BACK TO '80MG'$  DAILY Patient taking differently: Take 30 mg by mouth 2 (two) times daily. 02/14/22 states pcp told to take 1 1/2 tablets daily 11/22/21   Deberah Pelton, NP      Allergies    Aricept [donepezil], Baclofen, Lipitor [atorvastatin], Nsaids, Warfarin and related, and Enbrel [etanercept]    Review of Systems   Review of Systems  All other systems  reviewed and are negative.   Physical Exam Updated Vital Signs BP 106/67   Pulse 95   Temp 98.4 F (36.9 C)   Resp 18   Ht '5\' 8"'$  (1.727 m)   Wt 91 kg   SpO2 99%   BMI 30.50 kg/m  Physical Exam Vitals and nursing note reviewed.   81 year old male, resting comfortably and in no acute distress. Vital signs are normal. Oxygen saturation is 99%, which is normal. Head is normocephalic and atraumatic. PERRLA, EOMI. Oropharynx is clear. Neck is nontender and supple without adenopathy or JVD. Back is nontender and there is no CVA tenderness. Lungs are clear without rales, wheezes, or  rhonchi. Chest is nontender. Heart has regular rate and rhythm without murmur. Abdomen is soft, flat, nontender. Extremities: Skin tears are present over both elbows, worse on the right.  There is no swelling or deformity in the extremity, full passive range of motion is present in all joints without pain.  Moderate to severe venous stasis changes are present bilaterally with 1+ pretibial edema. Skin is warm and dry without rash. Neurologic: Mental status is normal, cranial nerves are intact, moves all extremities equally.  ED Results / Procedures / Treatments    Procedures Procedures    Medications Ordered in ED Medications - No data to display  ED Course/ Medical Decision Making/ A&P                           Medical Decision Making  Fall at home with skin tears to both elbows.  The tear in the left elbow is relatively minor and I have applied tissue adhesive.  The one on the right is more extensive and with some tissue loss and not amenable to tissue adhesive treatment.  Final Clinical Impression(s) / ED Diagnoses Final diagnoses:  Fall at home, initial encounter  Skin tear of elbow without complication, initial encounter    Rx / DC Orders ED Discharge Orders     None         Delora Fuel, MD 03/07/21 [phone]

## 2022-07-21 NOTE — ED Notes (Addendum)
Patient Alert and oriented to baseline. Stable and ambulatory to baseline. Patient verbalized understanding of the discharge instructions.  Patient belongings were taken by the patient.   

## 2022-07-24 DIAGNOSIS — D509 Iron deficiency anemia, unspecified: Secondary | ICD-10-CM | POA: Diagnosis not present

## 2022-07-25 DIAGNOSIS — Z0000 Encounter for general adult medical examination without abnormal findings: Secondary | ICD-10-CM | POA: Diagnosis not present

## 2022-07-25 DIAGNOSIS — F0280 Dementia in other diseases classified elsewhere without behavioral disturbance: Secondary | ICD-10-CM | POA: Diagnosis not present

## 2022-07-25 DIAGNOSIS — N1831 Chronic kidney disease, stage 3a: Secondary | ICD-10-CM | POA: Diagnosis not present

## 2022-07-25 DIAGNOSIS — R160 Hepatomegaly, not elsewhere classified: Secondary | ICD-10-CM | POA: Diagnosis not present

## 2022-07-25 DIAGNOSIS — E1121 Type 2 diabetes mellitus with diabetic nephropathy: Secondary | ICD-10-CM | POA: Diagnosis not present

## 2022-07-25 DIAGNOSIS — I5032 Chronic diastolic (congestive) heart failure: Secondary | ICD-10-CM | POA: Diagnosis not present

## 2022-07-25 DIAGNOSIS — I10 Essential (primary) hypertension: Secondary | ICD-10-CM | POA: Diagnosis not present

## 2022-07-25 DIAGNOSIS — D509 Iron deficiency anemia, unspecified: Secondary | ICD-10-CM | POA: Diagnosis not present

## 2022-07-25 DIAGNOSIS — I4819 Other persistent atrial fibrillation: Secondary | ICD-10-CM | POA: Diagnosis not present

## 2022-07-25 DIAGNOSIS — I2510 Atherosclerotic heart disease of native coronary artery without angina pectoris: Secondary | ICD-10-CM | POA: Diagnosis not present

## 2022-07-25 DIAGNOSIS — I700 Atherosclerosis of aorta: Secondary | ICD-10-CM | POA: Diagnosis not present

## 2022-07-27 DIAGNOSIS — R162 Hepatomegaly with splenomegaly, not elsewhere classified: Secondary | ICD-10-CM | POA: Diagnosis not present

## 2022-07-27 DIAGNOSIS — R160 Hepatomegaly, not elsewhere classified: Secondary | ICD-10-CM | POA: Diagnosis not present

## 2022-07-31 DIAGNOSIS — D509 Iron deficiency anemia, unspecified: Secondary | ICD-10-CM | POA: Diagnosis not present

## 2022-08-07 DIAGNOSIS — D509 Iron deficiency anemia, unspecified: Secondary | ICD-10-CM | POA: Diagnosis not present

## 2022-08-10 ENCOUNTER — Ambulatory Visit (INDEPENDENT_AMBULATORY_CARE_PROVIDER_SITE_OTHER): Payer: Medicare Other | Admitting: Internal Medicine

## 2022-08-10 ENCOUNTER — Encounter: Payer: Self-pay | Admitting: Internal Medicine

## 2022-08-10 VITALS — BP 108/76 | HR 92 | Ht 68.0 in | Wt 205.8 lb

## 2022-08-10 DIAGNOSIS — I4819 Other persistent atrial fibrillation: Secondary | ICD-10-CM | POA: Diagnosis not present

## 2022-08-10 DIAGNOSIS — I2510 Atherosclerotic heart disease of native coronary artery without angina pectoris: Secondary | ICD-10-CM

## 2022-08-10 DIAGNOSIS — R001 Bradycardia, unspecified: Secondary | ICD-10-CM

## 2022-08-10 LAB — CUP PACEART INCLINIC DEVICE CHECK
Date Time Interrogation Session: 20230824140926
Implantable Lead Implant Date: 20090420
Implantable Lead Implant Date: 20090420
Implantable Lead Location: 753859
Implantable Lead Location: 753860
Implantable Pulse Generator Implant Date: 20200708
Pulse Gen Model: 2272
Pulse Gen Serial Number: 9149180

## 2022-08-10 MED ORDER — DIGOXIN 125 MCG PO TABS: 0.1250 mg | ORAL_TABLET | Freq: Every day | ORAL | 3 refills | Status: DC

## 2022-08-10 NOTE — Progress Notes (Signed)
HPI [name] returns today for followup. He is a pleasant 82 yo man with dementia, HTN, COPD, and PAF. He was in AT when I saw him in December and feeling poorly. He is now in atrial fib with a RVR.  He is back on digoxin and a beta blocker but his bp tends to run a little low. Allergies  Allergen Reactions   Aricept [Donepezil] Other (See Comments)    Hallucination    Baclofen Itching   Lipitor [Atorvastatin] Other (See Comments)    Stiff joints   Nsaids Other (See Comments)    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   Warfarin And Related Other (See Comments)    Stiff joints   Enbrel [Etanercept] Itching     Current Outpatient Medications  Medication Sig Dispense Refill   allopurinol (ZYLOPRIM) 300 MG tablet Take 1 tablet (300 mg total) by mouth daily. 30 tablet 0   colchicine 0.6 MG tablet TAKE 1 TO 3 TABLETS AS NEEDED DAILY     digoxin (LANOXIN) 0.125 MG tablet Take 1 tablet (0.125 mg total) by mouth daily. Monday -Saturday none on Sunday 90 tablet 3   ezetimibe (ZETIA) 10 MG tablet Take 1 tablet (10 mg total) by mouth daily. 90 tablet 3   ferrous sulfate 325 (65 FE) MG tablet Take 1 tablet (325 mg total) by mouth daily with breakfast. 30 tablet 3   memantine (NAMENDA) 10 MG tablet Take 10 mg by mouth 2 (two) times daily.     metoprolol succinate (TOPROL-XL) 25 MG 24 hr tablet Take 0.5 tablets by mouth daily.     mirabegron ER (MYRBETRIQ) 50 MG TB24 tablet Take 1 tablet (50 mg total) by mouth daily. 30 tablet 3   modafinil (PROVIGIL) 100 MG tablet Take 1 tablet (100 mg total) by mouth daily. 30 tablet 5   Multiple Vitamin (MULTIVITAMIN WITH MINERALS) TABS tablet Take 1 tablet by mouth daily.     pantoprazole (PROTONIX) 40 MG tablet Take 1 tablet (40 mg total) by mouth daily. 30 tablet 0   rosuvastatin (CRESTOR) 40 MG tablet Take 1 tablet (40 mg total) by mouth daily. 90 tablet 2   sertraline (ZOLOFT) 100 MG tablet Take 2 tablets (200 mg total) by mouth daily. (Patient taking  differently: Take 150 mg by mouth daily. 1.5 tablets daily) 30 tablet 0   tamsulosin (FLOMAX) 0.4 MG CAPS capsule Take 1 capsule (0.4 mg total) by mouth at bedtime. 30 capsule 1   Teriparatide, Recombinant, 620 MCG/2.48ML SOPN inject 20 mcg Subcutaneous Once a day (Patient taking differently: Takes injection twice a year.) 1 mL 11   torsemide (DEMADEX) 20 MG tablet Take 4 tablets (80 mg total) by mouth 2 (two) times daily. 160 mg BID X1WEEK THEN BACK TO '80MG'$  DAILY (Patient taking differently: Take 30 mg by mouth 2 (two) times daily. 02/14/22 states pcp told to take 1 1/2 tablets daily) 45 tablet 6   No current facility-administered medications for this visit.     Past Medical History:  Diagnosis Date   (HFpEF) heart failure with preserved ejection fraction ([place])    a. 05/2013 Echo: EF 55%, mild LVH, diast dysfxn, Ao sclerosis, mildly dil LA, RV dysfxn (poorly visualized), PASP 27mHg;  b. 03/2017 Echo: EF 55-60%, no rwma, triv MR, mildly dil RV, mod TR, PASP 452mg.   [facility]   s/p [facility] Maze 1/09;  Multaq Rx d/c'd in 2014 due to pulmo fibrosis;  coumadin d/c'd in  2014 due to spontaneous subdural hematoma   BPH (benign prostatic hyperplasia)    CAD (coronary artery disease), native coronary artery    a. s/p CABG 12/2007;  b. Myoview 12/2011: EF 66%, no scar or ischemia; normal.   DM (diabetes mellitus) ([place])    Hyperlipidemia type II    Hypertension    [name] (monoclonal gammopathy of unknown significance) 07/31/2018   IgA   OSA (obstructive sleep apnea)    Pacemaker    PPM - St. Jude   Peripheral neuropathy 07/31/2018   Pulmonary fibrosis ([place])    Multaq d/c'd 7/14   Subdural hematoma ([place]) 07/2012   spontaneous;  coumadin d/c'd => no longer a candidate for anticoagulation    ROS:   All systems reviewed and negative except as noted in the HPI.   Past Surgical History:  Procedure Laterality Date   AMPUTATION Left 05/17/2019   Procedure: AMPUTATION LEFT FOURTH TOE;   Surgeon: Meredith Pel, MD;  Location: [facility];  Service: Orthopedics;  Laterality: Left;   AMPUTATION TOE Right 02/06/2020   Procedure: AMPUTATION TOE;  Surgeon: Newt Minion, MD;  Location: [facility];  Service: Orthopedics;  Laterality: Right;   AMPUTATION TOE Right 08/17/2020   Procedure: AMPUTATION TOE 4th toe;  Surgeon: Trula Slade, DPM;  Location: [facility];  Service: Podiatry;  Laterality: Right;   APPENDECTOMY     CHOLECYSTECTOMY     CORONARY ARTERY BYPASS GRAFT     x3 (left internal mammary artery to distal left anterior descending coronary artery, saphenous vain graft to second circumflex marginal branch, saphenous vain graft to posterior descending coronary artery, endoscopic saphenous vain harvest from right thigh) and modified Cox - Maze IV procedure.  [name],MD. Electronically signed CHO/MEDQ D: 01/09/2008/ JOB: [id number] cc:  [name]   CRANIOTOMY  07/30/2012   Procedure: CRANIOTOMY HEMATOMA EVACUATION SUBDURAL;  Surgeon: [name], MD;  Location: [facility];  Service: Neurosurgery;  Laterality: Right;  Right craniotomy for evacuation of subdural hematoma   FOOT SURGERY     HERNIA REPAIR     INTRAMEDULLARY (IM) NAIL INTERTROCHANTERIC Right 02/04/2020   Procedure: INTRAMEDULLARY (IM) NAIL INTERTROCHANTRIC;  Surgeon: Netta Cedars, MD;  Location: [facility];  Service: Orthopedics;  Laterality: Right;   ORCHIECTOMY     Left  /  testicular cancer   PACEMAKER PLACEMENT     PPM - St. Jude   PPM GENERATOR CHANGEOUT N/A 06/25/2019   Procedure: PPM GENERATOR CHANGEOUT;  Surgeon: Evans Lance, MD;  Location: [facility];  Service: Cardiovascular;  Laterality: N/A;     Family History  Problem Relation Age of Onset   Heart disease Father    Heart attack Father    Heart failure Father    Heart disease Mother    Alzheimer's disease Mother    Dementia Mother      Social History   Socioeconomic History   Marital status: Married    Spouse name: Immunologist    Number of children: 2   Years of education: Not on file   Highest education level: Not on file  Occupational History   Occupation: Retired- IT trainer  Tobacco Use   Smoking status: Former    Packs/day: 2.00    Years: 20.00    Total pack years: 40.00    Types: Cigarettes    Quit date: 02/21/1991    Years since quitting: 31.4   Smokeless tobacco: Never  Vaping Use   Vaping  Use: Never used  Substance and Sexual Activity   Alcohol use: No    Alcohol/week: 0.0 standard drinks of alcohol   Drug use: No   Sexual activity: Not Currently  Other Topics Concern   Not on file  Social History Narrative   Lives with wife   Right handed    Married with two children.     He is a Engineer, structural.     Social Determinants of Health   Financial Resource Strain: Not on file  Food Insecurity: Not on file  Transportation Needs: Not on file  Physical Activity: Not on file  Stress: Not on file  Social Connections: Not on file  Intimate Partner Violence: Not on file     BP 108/76   Pulse 92   Ht '5\' 8"'$  (1.727 m)   Wt 205 lb 12.8 oz (93.4 kg)   SpO2 95%   BMI 31.29 kg/m   Physical Exam:  Chronically ill appearing NAD HEENT: Unremarkable Neck:  No JVD, no thyromegally Lymphatics:  No adenopathy Back:  No CVA tenderness Lungs:  Clear HEART:  IRegular rate rhythm, no murmurs, no rubs, no clicks Abd:  soft, positive bowel sounds, no organomegally, no rebound, no guarding Ext:  2 plus pulses, no edema, no cyanosis, no clubbing Skin:  No rashes no nodules Neuro:  CN II through XII intact, motor grossly intact  EKG - reviewed. Atrial fib with a CVR and RB BB  DEVICE  Normal device function.  See PaceArt for details.   Assess/Plan:   Atrial tachy - he is better. I asked him to continue the beta blocker and increase the digoxin daily 6 days a week.  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 Atrial fib - he has reverted back to atrial fib and his rates are not  well controlled. We will increase the digoxin and increase the dose to 6 times a week. CAD - he denies anginal symptoms.  Diastolic heart failure - his symptoms are now class 2 from 3. He will continue his current meds.   Carleene Overlie Zian Delair,MD

## 2022-08-10 NOTE — Patient Instructions (Signed)
Medication Instructions:  Your physician has recommended you make the following change in your medication:   Take Digoxin 0.'125mg'$  Daily Monday-Saturday none on Sunday   *If you need a refill on your cardiac medications before your next appointment, please call your pharmacy*   Lab Work: Your physician recommends that you return for lab work in: Oct 2   If you have labs (blood work) drawn today and your tests are completely normal, you will receive your results only by: [employer] (if you have MyChart) OR A paper copy in the mail If you have any lab test that is abnormal or we need to change your treatment, we will call you to review the results.   Testing/Procedures: NONE    Follow-Up: At [facility], you and your health needs are our priority.  As part of our continuing mission to provide you with exceptional heart care, we have created designated Provider Care Teams.  These Care Teams include your primary Cardiologist (physician) and Advanced Practice Providers (APPs -  Physician Assistants and Nurse Practitioners) who all work together to provide you with the care you need, when you need it.  We recommend signing up for the patient portal called "MyChart".  Sign up information is provided on this After Visit Summary.  MyChart is used to connect with patients for Virtual Visits (Telemedicine).  Patients are able to view lab/test results, encounter notes, upcoming appointments, etc.  Non-urgent messages can be sent to your provider as well.   To learn more about what you can do with MyChart, go to [email or web address].    Your next appointment:   1 year(s)  The format for your next appointment:   In Person  Provider:   Cristopher Peru, MD    Other Instructions Thank you for choosing [facility]!    Important Information About Sugar

## 2022-08-29 DIAGNOSIS — D508 Other iron deficiency anemias: Secondary | ICD-10-CM | POA: Diagnosis not present

## 2022-08-31 DIAGNOSIS — R1900 Intra-abdominal and pelvic swelling, mass and lump, unspecified site: Secondary | ICD-10-CM | POA: Diagnosis not present

## 2022-08-31 DIAGNOSIS — D509 Iron deficiency anemia, unspecified: Secondary | ICD-10-CM | POA: Diagnosis not present

## 2022-08-31 DIAGNOSIS — G4719 Other hypersomnia: Secondary | ICD-10-CM | POA: Diagnosis not present

## 2022-09-01 NOTE — Progress Notes (Addendum)
HPI: FU CAD, CHF and atrial fibrillation. He underwent CABG (LIMA-LAD, SVG-OM 2, SVG-PDA) along with modified Cox Maze IV procedure in 12/2007. 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 Abdominal US 3/12: No aneurysm. 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 He underwent craniotomy and evacuation by Dr. Saintclair Halsted. He was taken off of Coumadin and no longer felt to be an anticoagulation candidate. Multaq DCed previously as felt causing pulmonary toxixity. Nuclear study 5/18 showed EF 59 with normal perfusion. ABIs June 2020 normal. Carotid Dopplers June 2020 showed 1 to 39% bilateral stenosis. Transcranial Dopplers June 2020 negative. Chest CT August 2022 showed pulmonary fibrosis, probable cirrhosis, enlarged spleen and enlarged pulmonary trunk suggestive of pulmonary hypertension. Monitor November 2022 showed atrial fibrillation rate mildly elevated. Metoprolol was increased.   Echocardiogram November 2022 showed normal LV function, flattened septum suggestive of volume overload, moderate right ventricular enlargement with moderate RV dysfunction, moderate pulmonary hypertension, mild left atrial enlargement, moderate right atrial enlargement, severe tricuspid regurgitation.  Patient seen by Dr. Lovena Le August 2023 and digoxin increased for improved rate control.  Since last seen, he has some dyspnea on exertion.  This is unchanged.  No orthopnea, PND, chest pain or syncope.  His pedal edema is controlled with present dose of diuretic.  Current Outpatient Medications  Medication Sig Dispense Refill   allopurinol (ZYLOPRIM) 300 MG tablet Take 1 tablet (300 mg total) by mouth daily. 30 tablet 0   colchicine 0.6 MG tablet TAKE 1 TO 3 TABLETS AS NEEDED DAILY     digoxin (LANOXIN) 0.125 MG tablet Take 1 tablet (0.125 mg total) by mouth daily. Monday -Saturday none on Sunday 90 tablet 3   ezetimibe (ZETIA) 10 MG tablet Take 1 tablet (10 mg  total) by mouth daily. 90 tablet 3   ferrous sulfate 325 (65 FE) MG tablet Take 1 tablet (325 mg total) by mouth daily with breakfast. 30 tablet 3   memantine (NAMENDA) 10 MG tablet Take 10 mg by mouth 2 (two) times daily.     metoprolol succinate (TOPROL-XL) 25 MG 24 hr tablet Take 0.5 tablets by mouth daily.     mirabegron ER (MYRBETRIQ) 50 MG TB24 tablet Take 1 tablet (50 mg total) by mouth daily. 30 tablet 3   modafinil (PROVIGIL) 100 MG tablet Take 1 tablet (100 mg total) by mouth daily. 30 tablet 5   Multiple Vitamin (MULTIVITAMIN WITH MINERALS) TABS tablet Take 1 tablet by mouth daily.     pantoprazole (PROTONIX) 40 MG tablet Take 1 tablet (40 mg total) by mouth daily. 30 tablet 0   rosuvastatin (CRESTOR) 40 MG tablet Take 1 tablet (40 mg total) by mouth daily. 90 tablet 2   sertraline (ZOLOFT) 100 MG tablet Take 2 tablets (200 mg total) by mouth daily. (Patient taking differently: Take 150 mg by mouth daily. 1.5 tablets daily) 30 tablet 0   tamsulosin (FLOMAX) 0.4 MG CAPS capsule Take 1 capsule (0.4 mg total) by mouth at bedtime. 30 capsule 1   Teriparatide, Recombinant, 620 MCG/2.48ML SOPN inject 20 mcg Subcutaneous Once a day (Patient taking differently: Takes injection twice a year.) 1 mL 11   torsemide (DEMADEX) 20 MG tablet Take 4 tablets (80 mg total) by mouth 2 (two) times daily. 160 mg BID X1WEEK THEN BACK TO '80MG'$  DAILY (Patient taking differently: Take 30 mg by mouth 2 (two) times daily. 02/14/22 states pcp told to take 1 1/2 tablets daily) 45 tablet 6   No  current facility-administered medications for this visit.     Past Medical History:  Diagnosis Date   (HFpEF) heart failure with preserved ejection fraction ([place])    a. 05/2013 Echo: EF 55%, mild LVH, diast dysfxn, Ao sclerosis, mildly dil LA, RV dysfxn (poorly visualized), PASP 83mHg;  b. 03/2017 Echo: EF 55-60%, no rwma, triv MR, mildly dil RV, mod TR, PASP 44mg.   Atrial fibrillation ([facility]   s/p Cox Maze 1/09;  Multaq  Rx d/c'd in 2014 due to pulmo fibrosis;  coumadin d/c'd in 2014 due to spontaneous subdural hematoma   BPH (benign prostatic hyperplasia)    CAD (coronary artery disease), native coronary artery    a. s/p CABG 12/2007;  b. Myoview 12/2011: EF 66%, no scar or ischemia; normal.   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 Jude   Peripheral neuropathy 07/31/2018   Pulmonary fibrosis ([facility]   Multaq d/c'd 7/14   Subdural hematoma ([facility]   spontaneous;  coumadin d/c'd => no longer a candidate for anticoagulation    Past Surgical History:  Procedure Laterality Date   AMPUTATION Left 05/17/2019   Procedure: AMPUTATION LEFT FOURTH TOE;  Surgeon: DeMeredith PelMD;  [facility] Service: Orthopedics;  Laterality: Left;   AMPUTATION TOE Right 02/06/2020   Procedure: AMPUTATION TOE;  Surgeon: DuNewt MinionMD;  [facility]: [facility];  Laterality: Right;   AMPUTATION TOE Right 08/17/2020   Procedure: AMPUTATION TOE 4th toe;  Surgeon: WaTrula SladeDPM;  Location: [facility];  Service: Podiatry;  Laterality: Right;   APPENDECTOMY     CHOLECYSTECTOMY     CORONARY ARTERY BYPASS GRAFT     x3 (left internal mammary artery to distal left anterior descending coronary artery, saphenous vain graft to second circumflex marginal branch, saphenous vain graft to posterior descending coronary artery, endoscopic saphenous vain harvest from right thigh) and modified Cox - Maze IV procedure.  ClValentina GuOwen,MD. Electronically signed CHO/MEDQ D: 01/09/2008/ JOB: [id number]:  JoIran SizerD   CRANIOTOMY  07/30/2012   Procedure: CRANIOTOMY HEMATOMA EVACUATION SUBDURAL;  Surgeon: GaElaina HoopsMD;  [facility];  Service: Neurosurgery;  Laterality: Right;  Right craniotomy for evacuation of subdural hematoma   FOOT  SURGERY     HERNIA REPAIR     INTRAMEDULLARY (IM) NAIL INTERTROCHANTERIC Right 02/04/2020   Procedure: INTRAMEDULLARY (IM) NAIL INTERTROCHANTRIC;  Surgeon: NoNetta CedarsMD;  [facility] Service: [facility]ics;  Laterality: Right;   ORCHIECTOMY     Left  /  testicular cancer   PACEMAKER PLACEMENT     PPM - St. Jude   PPM GENERATOR CHANGEOUT N/A 06/25/2019   Procedure: PPM GENERATOR CHANGEOUT;  Surgeon: TaEvans LanceMD;  [facility];  Service: Cardiovascular;  Laterality: N/A;    Social History   Socioeconomic History   Marital status: Married    Spouse name: Vikky   Number of children: 2   Years of education: Not on file   Highest education level: Not on file  Occupational History   Occupation: Retired- fiIT trainerTobacco Use   Smoking status: Former    Packs/day: 2.00    Years: 20.00    Total pack years: 40.00  Types: Cigarettes    Quit date: 02/21/1991    Years since quitting: 31.5   Smokeless tobacco: Never  Vaping Use   Vaping Use: Never used  Substance and Sexual Activity   Alcohol use: No    Alcohol/week: 0.0 standard drinks of alcohol   Drug use: No   Sexual activity: Not Currently  Other Topics Concern   Not on file  Social History Narrative   Lives with wife   Right handed    Married with two children.     He is a Engineer, structural.     Social Determinants of Health   Financial Resource Strain: Not on file  Food Insecurity: Not on file  Transportation Needs: Not on file  Physical Activity: Not on file  Stress: Not on file  Social Connections: Not on file  Intimate Partner Violence: Not on file    Family History  Problem Relation Age of Onset   Heart disease Father    Heart attack Father    Heart failure Father    Heart disease Mother    Alzheimer's disease Mother    Dementia Mother     ROS: no fevers or chills, productive cough, hemoptysis, dysphasia, odynophagia, melena, hematochezia, dysuria, hematuria, rash, seizure  activity, orthopnea, PND, pedal edema, claudication. Remaining systems are negative.  Physical Exam: Well-developed well-nourished in no acute distress.  Skin is warm and dry.  HEENT is normal.  Neck is supple.  Chest is clear to auscultation with normal expansion.  Cardiovascular exam is regular rate and rhythm.  Abdominal exam nontender or distended. No masses palpated. Extremities show chronic skin changes and trace edema. neuro grossly intact  ECG-possible atrial fibrillation though rate appears more regular; right bundle branch block.  Personally reviewed  A/P  1 chronic diastolic congestive heart failure-this is predominantly right-sided congestive heart failure and is felt secondary to pulmonary venous hypertension in combination with pulmonary fibrosis and sleep apnea.  Continue diuretic at present dose.  I have not added SGLT2 inhibitor as he has urinary incontinence and I am concerned about the risk of infection.  Check potassium and renal function.  2 persistent atrial fibrillation-continue beta-blocker and digoxin for rate control.  Check digoxin level.  He is not a candidate for anticoagulation as he has a history of spontaneous subdural hematoma.  3 coronary artery disease-continue statin.  No aspirin given history of spontaneous subdural hematoma.  4 history of ectopic atrial tachycardia-continue beta-blocker at present dose.  5 severe tricuspid regurgitation-felt possibly secondary to right ventricular enlargement versus trauma from previous pacemaker placement.  Conservative measures given comorbidities.  6 hyperlipidemia-continue statin.  Check lipids and liver.  7 history of pacemaker-patient is followed by Dr. Lovena Le.  8 cirrhosis/pulmonary fibrosis/chronic anemia/thrombocytopenia-patient will follow-up with primary care for these issues.  Kirk Ruths, MD

## 2022-09-12 ENCOUNTER — Ambulatory Visit (INDEPENDENT_AMBULATORY_CARE_PROVIDER_SITE_OTHER): Payer: Medicare Other | Admitting: Podiatry

## 2022-09-12 DIAGNOSIS — I2510 Atherosclerotic heart disease of native coronary artery without angina pectoris: Secondary | ICD-10-CM | POA: Diagnosis not present

## 2022-09-12 DIAGNOSIS — E1149 Type 2 diabetes mellitus with other diabetic neurological complication: Secondary | ICD-10-CM

## 2022-09-12 DIAGNOSIS — M79675 Pain in left toe(s): Secondary | ICD-10-CM

## 2022-09-12 DIAGNOSIS — R2681 Unsteadiness on feet: Secondary | ICD-10-CM

## 2022-09-12 DIAGNOSIS — B351 Tinea unguium: Secondary | ICD-10-CM

## 2022-09-12 DIAGNOSIS — M79674 Pain in right toe(s): Secondary | ICD-10-CM | POA: Diagnosis not present

## 2022-09-12 NOTE — Progress Notes (Unsigned)
PATIENT: [name]. DOB: May 27, 1941  REASON FOR VISIT: follow up HISTORY FROM: patient  Chief Complaint  Patient presents with   Follow-up    Rm 19, wife.  Memory is up and down.  Modafinil per pharmacy to increase to 200mg  po daily.      HISTORY OF PRESENT ILLNESS: Today 09/13/22:  [name] is a 81 year old male with a history of obstructive sleep apnea on CPAP and memory disturbance.  He returns today for follow-up.  The patient states that CPAP works well for him.  He does notice the mask leaking and has not changed out recently.  His wife reports that he sleeps all times of the day.  He does not have a regular sleep routine.  Some nights he will not go to bed till 3 AM there is other nights that he goes to bed then wakes up at 2 AM and stays up for several hours.  Is using modafinil during the day but not sure that is beneficial.  She reports that the pharmacist told her to increase it to 200 however she has not done that.  Feels that memory is worse according to his wife. Wife reports that he gets distracted easily. He will take trash out but then will get distracted and start cleaning out the garage. Sometimes does not remember what he wife has told him. Able to do all ADLS independently. Does not drive typically. Appetite has decreased. Has been drinking ensure. Has continued on namenda 10 mg BID. Wife reports that he sleeps a lot during the day.     03/08/22: [name] is a 81 year old male with a history of obstructive sleep apnea on CPAP.  He returns today for follow-up.  He still struggles with daytime sleepiness.  He reports that he did try modafinil 50 mg daily.  Unsure of the benefit but reports insurance will no longer cover this.  In the past he was seeing Dr. Anne Hahn for some memory issues.  His wife reports that she has noticed some issues with his memory.  He is able to complete all ADLs independently.  He no longer drives.  He is on Namenda.     03/08/21:  [name] is a 81 year old male with a history of obstructive sleep apnea on CPAP and excessive daytime sleepiness.  He returns today for follow-up.  He reports that there are some nights that he does not sleep greater than 4 hours.  He states that he also sleeps a lot during the day.  He does not think he ever got the prescription for modafinil.  He will check at home and ensure that he is not taking his medicine.  Wife reports that they can be driving and he will fall asleep in the car.  He does not operate a motor vehicle.  She states that she can have a conversation with him and he falls asleep during the conversation.    09/08/20: [name] is a 81 year old male with a history of obstructive sleep apnea on CPAP.  His download indicates that he uses machine 21 out of 30 days for compliance of 70%.  He uses machine greater than 4 hours 19 days for compliance of 63%.  On average he uses his machine 6 hours and 33 minutes.  His residual AHI is 3.6 on 6 to 16 cm of water with EPR of 3.  Leak in the 95th percentile is 43.2 L/min.  He reports that he just was able to  get modafinil last night.  He has not taken a dose yet.  HISTORY Patient CPAP download indicates that he did use his machine nightly for compliance of 100%. He uses machine greater than 4 hours 16 days for compliance of 53%. On average he uses his machine 4 hours and 35 minutes. His residual AHI is 3.7 on 6 to 16 cm of water with EPR of 3. Leak in the 95th percentile is 29.6 L/min.   The patient and his wife state that he continues to nap throughout the day. Wife reports that he typically goes back to bed around 2 PM and will not get up to 5 PM. Reports that he has several naps throughout the day. Patient reports that he would like to be more awake during the day. He is interested in trying medication  REVIEW OF SYSTEMS: Out of a complete 14 system review of symptoms, the patient complains only of the following symptoms, and all other reviewed  systems are negative.  See HPI  ALLERGIES: Allergies  Allergen Reactions   Aricept [Donepezil] Other (See Comments)    Hallucination    Baclofen Itching   Lipitor [Atorvastatin] Other (See Comments)    Stiff joints   Nsaids Other (See Comments)    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   Warfarin And Related Other (See Comments)    Stiff joints   Enbrel [Etanercept] Itching    HOME MEDICATIONS: Outpatient Medications Prior to Visit  Medication Sig Dispense Refill   allopurinol (ZYLOPRIM) 300 MG tablet Take 1 tablet (300 mg total) by mouth daily. 30 tablet 0   colchicine 0.6 MG tablet TAKE 1 TO 3 TABLETS AS NEEDED DAILY     digoxin (LANOXIN) 0.125 MG tablet Take 1 tablet (0.125 mg total) by mouth daily. Monday -Saturday none on Sunday 90 tablet 3   ezetimibe (ZETIA) 10 MG tablet Take 1 tablet (10 mg total) by mouth daily. 90 tablet 3   ferrous sulfate 325 (65 FE) MG tablet Take 1 tablet (325 mg total) by mouth daily with breakfast. 30 tablet 3   memantine (NAMENDA) 10 MG tablet Take 10 mg by mouth 2 (two) times daily.     metoprolol succinate (TOPROL-XL) 25 MG 24 hr tablet Take 0.5 tablets by mouth daily.     mirabegron ER (MYRBETRIQ) 50 MG TB24 tablet Take 1 tablet (50 mg total) by mouth daily. 30 tablet 3   modafinil (PROVIGIL) 100 MG tablet Take 1 tablet (100 mg total) by mouth daily. 30 tablet 5   Multiple Vitamin (MULTIVITAMIN WITH MINERALS) TABS tablet Take 1 tablet by mouth daily.     pantoprazole (PROTONIX) 40 MG tablet Take 1 tablet (40 mg total) by mouth daily. 30 tablet 0   rosuvastatin (CRESTOR) 40 MG tablet Take 1 tablet (40 mg total) by mouth daily. 90 tablet 2   sertraline (ZOLOFT) 100 MG tablet Take 2 tablets (200 mg total) by mouth daily. (Patient taking differently: Take 150 mg by mouth daily. 1.5 tablets daily) 30 tablet 0   tamsulosin (FLOMAX) 0.4 MG CAPS capsule Take 1 capsule (0.4 mg total) by mouth at bedtime. 30 capsule 1   Teriparatide, Recombinant, 620 MCG/2.48ML  SOPN inject 20 mcg Subcutaneous Once a day (Patient taking differently: Takes injection twice a year.) 1 mL 11   torsemide (DEMADEX) 20 MG tablet Take 4 tablets (80 mg total) by mouth 2 (two) times daily. 160 mg BID X1WEEK THEN BACK TO 80MG  DAILY (Patient taking differently: Take 30 mg by  mouth 2 (two) times daily. 02/14/22 states pcp told to take 1 1/2 tablets daily) 45 tablet 6   No facility-administered medications prior to visit.    PAST MEDICAL HISTORY: Past Medical History:  Diagnosis Date   (HFpEF) heart failure with preserved ejection fraction (HCC)    a. 05/2013 Echo: EF 55%, mild LVH, diast dysfxn, Ao sclerosis, mildly dil LA, RV dysfxn (poorly visualized), PASP ;  b. 03/2017 Echo: EF 55-60%, no rwma, triv MR, mildly dil RV, mod TR, PASP .   Atrial fibrillation (HCC)    s/p Cox Maze 1/09;  Multaq Rx d/c'd in 2014 due to pulmo fibrosis;  coumadin d/c'd in 2014 due to spontaneous subdural hematoma   BPH (benign prostatic hyperplasia)    CAD (coronary artery disease), native coronary artery    a. s/p CABG 12/2007;  b. Myoview 12/2011: EF 66%, no scar or ischemia; normal.   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 Jude   Peripheral neuropathy 07/31/2018   Pulmonary fibrosis (HCC)    Multaq d/c'd 7/14   Subdural hematoma (HCC) 07/2012   spontaneous;  coumadin d/c'd => no longer a candidate for anticoagulation    PAST SURGICAL HISTORY: Past Surgical History:  Procedure Laterality Date   AMPUTATION Left 05/17/2019   Procedure: AMPUTATION LEFT FOURTH TOE;  Surgeon: Cammy Copa, MD;  Location: [facility] OR;  Service: Orthopedics;  Laterality: Left;   AMPUTATION TOE Right 02/06/2020   Procedure: AMPUTATION TOE;  Surgeon: Nadara Mustard, MD;  Location: [facility] OR;  Service: Orthopedics;  Laterality: Right;   AMPUTATION TOE  Right 08/17/2020   Procedure: AMPUTATION TOE 4th toe;  Surgeon: [name], DPM;  Location: [facility] ORS;  Service: Podiatry;  Laterality: Right;   APPENDECTOMY     CHOLECYSTECTOMY     CORONARY ARTERY BYPASS GRAFT     x3 (left internal mammary artery to distal left anterior descending coronary artery, saphenous vain graft to second circumflex marginal branch, saphenous vain graft to posterior descending coronary artery, endoscopic saphenous vain harvest from right thigh) and modified Cox - Maze IV procedure.  [name]. [name],MD. Electronically signed CHO/MEDQ D: 01/09/2008/ JOB: [id number] cc:  Antionette Char MD   CRANIOTOMY  07/30/2012   Procedure: CRANIOTOMY HEMATOMA EVACUATION SUBDURAL;  Surgeon: Mariam Dollar, MD;  Location: [facility] NEURO ORS;  Service: Neurosurgery;  Laterality: Right;  Right craniotomy for evacuation of subdural hematoma   FOOT SURGERY     HERNIA REPAIR     INTRAMEDULLARY (IM) NAIL INTERTROCHANTERIC Right 02/04/2020   Procedure: INTRAMEDULLARY (IM) NAIL INTERTROCHANTRIC;  Surgeon: Beverely Low, MD;  Location: [facility] OR;  Service: Orthopedics;  Laterality: Right;   ORCHIECTOMY     Left  /  testicular cancer   PACEMAKER PLACEMENT     PPM - St. Jude   PPM GENERATOR CHANGEOUT N/A 06/25/2019   Procedure: PPM GENERATOR CHANGEOUT;  Surgeon: Marinus Maw, MD;  Location: [facility] INVASIVE CV LAB;  Service: Cardiovascular;  Laterality: N/A;    FAMILY HISTORY: Family History  Problem Relation Age of Onset   Heart disease Father    Heart attack Father    Heart failure Father    Heart disease Mother    Alzheimer's disease Mother    Dementia Mother     SOCIAL HISTORY:  Social History   Socioeconomic History   Marital status: Married    Spouse name: Engineer, maintenance (IT)   Number of children: 2   Years of education: Not on file   Highest education level: Not on file  Occupational History   Occupation: Retired- Theatre stage manager  Tobacco Use   Smoking status: Former    Packs/day: 2.00    Years: 20.00     Total pack years: 40.00    Types: Cigarettes    Quit date: 02/21/1991    Years since quitting: 31.5   Smokeless tobacco: Never  Vaping Use   Vaping Use: Never used  Substance and Sexual Activity   Alcohol use: No    Alcohol/week: 0.0 standard drinks of alcohol   Drug use: No   Sexual activity: Not Currently  Other Topics Concern   Not on file  Social History Narrative   Lives with wife   Right handed    Married with two children.     He is a Emergency planning/management officer.     Social Determinants of Health   Financial Resource Strain: Not on file  Food Insecurity: Not on file  Transportation Needs: Not on file  Physical Activity: Not on file  Stress: Not on file  Social Connections: Not on file  Intimate Partner Violence: Not on file      PHYSICAL EXAM  Vitals:   09/13/22 1053  BP: 125/69  Pulse: 87  Weight: 184 lb 9.6 oz (83.7 kg)  Height: 5\' 8"  (1.727 m)   Body mass index is 28.07 kg/m.     09/13/2022   11:02 AM 03/08/2022   11:49 AM 11/18/2019   10:04 AM  MMSE - Mini Mental State Exam  Orientation to time 4 5 4   Orientation to Place 5 5 4   Registration 3 3 3   Attention/ Calculation 1 5 4   Attention/Calculation-comments  WORLD   Recall 2 1 3   Language- name 2 objects 2 2 2   Language- repeat 1 1 1   Language- follow 3 step command 3 3 3   Language- read & follow direction 1 1 1   Write a sentence 1 1 1   Copy design 1 1 1   Total score 24 28 27      Generalized: Well developed, in no acute distress  Chest: Lungs clear to auscultation bilaterally  Neurological examination  Mentation: Alert oriented to time, place, history taking. Follows all commands speech and language fluent Cranial nerve II-XII: Extraocular movements were full, visual field were full on confrontational test Head turning and shoulder shrug  were normal and symmetric. Motor: The motor testing reveals 5 over 5 strength of all 4 extremities. Good symmetric motor tone is noted throughout.  Sensory:  Sensory testing is intact to soft touch on all 4 extremities. No evidence of extinction is noted.  Gait and station: Uses a cane when ambulating  DIAGNOSTIC DATA (LABS, IMAGING, TESTING) - I reviewed patient records, labs, notes, testing and imaging myself where available.  Lab Results  Component Value Date   WBC 3.9 (L) 05/10/2022   HGB 10.1 (L) 05/10/2022   HCT 30.5 (L) 05/10/2022   MCV 90.8 05/10/2022   PLT 87 (L) 05/10/2022      Component Value Date/Time   NA 144 05/10/2022 0917   NA 143 03/09/2022 0934   NA 141 02/23/2017 1237   K 3.2 (L) 05/10/2022 0917   K 3.8 02/23/2017 1237   CL 107 05/10/2022 0917   CO2 30 05/10/2022 0917   CO2 27 02/23/2017  1237   GLUCOSE 130 (H) 05/10/2022 0917   GLUCOSE 113 02/23/2017 1237   BUN 22 05/10/2022 0917   BUN 16 03/09/2022 0934   BUN 18.3 02/23/2017 1237   CREATININE 1.41 (H) 05/10/2022 0917   CREATININE 1.2 02/23/2017 1237   CALCIUM 9.1 05/10/2022 0917   CALCIUM 9.6 02/23/2017 1237   PROT 7.7 05/10/2022 0917   PROT 7.3 06/18/2018 1205   PROT 8.0 02/23/2017 1237   ALBUMIN 4.1 05/10/2022 0917   ALBUMIN 4.1 09/24/2017 0824   ALBUMIN 4.1 02/23/2017 1237   AST 28 05/10/2022 0917   AST 24 02/23/2017 1237   ALT 19 05/10/2022 0917   ALT 15 02/23/2017 1237   ALKPHOS 85 05/10/2022 0917   ALKPHOS 105 02/23/2017 1237   BILITOT 0.6 05/10/2022 0917   BILITOT 0.6 09/24/2017 0824   BILITOT 0.98 02/23/2017 1237   GFRNONAA 51 (L) 05/10/2022 0917   GFRAA 72 02/01/2021 1410   Lab Results  Component Value Date   CHOL 103 05/22/2019   HDL 40 (L) 05/22/2019   LDLCALC 51 05/22/2019   LDLDIRECT 154.2 10/20/2013   TRIG 58 05/22/2019   CHOLHDL 2.6 05/22/2019   Lab Results  Component Value Date   HGBA1C 5.6 05/21/2019   Lab Results  Component Value Date   VITAMINB12 481 11/07/2019   Lab Results  Component Value Date   TSH 2.022 05/16/2019      ASSESSMENT AND PLAN 81 y.o. year old male  has a past medical history of (HFpEF)  heart failure with preserved ejection fraction (HCC), Atrial fibrillation (HCC), BPH (benign prostatic hyperplasia), CAD (coronary artery disease), native coronary artery, DM (diabetes mellitus) (HCC), Hyperlipidemia type II, Hypertension, [name] (monoclonal gammopathy of unknown significance) (07/31/2018), OSA (obstructive sleep apnea), Pacemaker, Peripheral neuropathy (07/31/2018), Pulmonary fibrosis (HCC), and Subdural hematoma (HCC) (07/2012). here with:  Obstructive sleep apnea on CPAP  Download indicates good compliance but not greater than 4 hours due to the patient not sleeping longer at night. Good treatment of apnea Encourage patient continue using CPAP nightly and greater than 4 hours each night We had a long discussion about sleep hygiene.  Advised that I am not sure how much modafinil is going to help if he does not improve his sleep hygiene to get better sleep at night.  Patient and wife state that they will work on this  Idiopathic hypersomnia  Continue modafinil 100 mg daily as needed.  Did advise that until his sleep hygiene is improved modafinil most likely would not offer him significant benefit  Memory Disturbance  MMSE 24/30 Score has slightly declined Continue Namenda 10 mg BID   FU in 6 months or sooner if needed  Butch Penny, MSN, NP-C 09/13/2022, 11:16 AM [facility] [address], [address] [place], [place] [address] [phone]

## 2022-09-12 NOTE — Progress Notes (Unsigned)
Subjective: [name]. is a 81 y.o. is seen today for foot exam and for thick, elongated toes that he cannot trim himself.  No open lesions.  States he is fallen some.  Previously had physical therapy which was helpful but he has not done it recently.  No open lesions he reports he has no other concerns.  [name], MD  Objective: General: No acute distress DP/PT pulses palpable 2/4, CRT < 3 sec to all digits.  Sensation decreased with Semmes Weinstein monofilament The nails appear to be hypertrophic, dystrophic to the left 1, 3, 5 in the right 1, 3, 5.  No edema, erythema or signs of infection of the toenail sites noted today. Calluses are minimal today.  There is small scab on the dorsal right hallux with no edema, erythema or signs of infection. Hammertoes present.  No pain with calf compression, swelling, warmth, erythema.    Assessment and Plan:  Daily foot exam, symptomatic onychomycosis, dry skin, preulcerative area right third toe hammertoes present.  -Treatment options discussed including all alternatives, risks, and complications -Discussed the importance of daily foot inspection proper shoe gear. -Sharply debrided nails x 6 without any complications or bleeding -Continue moisturizer for calluses.  They were minimal today. -Referral for home health physical therapy placed.  We will follow-up on this.  Message was sent to [place] for follow-up.  -Monitor area of the scab on the right hallux.  Monitor for any signs or symptoms of infection.  Currently I am not able to appreciate any.  He is not sure how this started.  Continue offloading. -Daily foot inspection.  -Monitor for any clinical signs or symptoms of infection and directed to call the office immediately should any occur or go to the ER.  Trula Slade DPM

## 2022-09-13 ENCOUNTER — Ambulatory Visit (INDEPENDENT_AMBULATORY_CARE_PROVIDER_SITE_OTHER): Payer: Medicare Other | Admitting: Adult Health

## 2022-09-13 ENCOUNTER — Encounter: Payer: Self-pay | Admitting: Adult Health

## 2022-09-13 VITALS — BP 125/69 | HR 87 | Ht 68.0 in | Wt 184.6 lb

## 2022-09-13 DIAGNOSIS — G4711 Idiopathic hypersomnia with long sleep time: Secondary | ICD-10-CM

## 2022-09-13 DIAGNOSIS — R413 Other amnesia: Secondary | ICD-10-CM | POA: Diagnosis not present

## 2022-09-13 DIAGNOSIS — G4733 Obstructive sleep apnea (adult) (pediatric): Secondary | ICD-10-CM | POA: Diagnosis not present

## 2022-09-13 DIAGNOSIS — Z9989 Dependence on other enabling machines and devices: Secondary | ICD-10-CM

## 2022-09-14 ENCOUNTER — Encounter: Payer: Self-pay | Admitting: Cardiology

## 2022-09-14 ENCOUNTER — Ambulatory Visit: Payer: Medicare Other | Attending: Cardiology | Admitting: Cardiology

## 2022-09-14 VITALS — BP 110/60 | HR 82 | Ht 68.0 in | Wt 182.4 lb

## 2022-09-14 DIAGNOSIS — I5032 Chronic diastolic (congestive) heart failure: Secondary | ICD-10-CM | POA: Diagnosis not present

## 2022-09-14 DIAGNOSIS — I4819 Other persistent atrial fibrillation: Secondary | ICD-10-CM | POA: Diagnosis not present

## 2022-09-14 DIAGNOSIS — I071 Rheumatic tricuspid insufficiency: Secondary | ICD-10-CM | POA: Insufficient documentation

## 2022-09-14 DIAGNOSIS — I2510 Atherosclerotic heart disease of native coronary artery without angina pectoris: Secondary | ICD-10-CM | POA: Insufficient documentation

## 2022-09-14 DIAGNOSIS — Z950 Presence of cardiac pacemaker: Secondary | ICD-10-CM | POA: Diagnosis not present

## 2022-09-14 MED ORDER — DAPAGLIFLOZIN PROPANEDIOL 10 MG PO TABS: 10.0000 mg | ORAL_TABLET | Freq: Every day | ORAL | 11 refills | Status: DC

## 2022-09-14 NOTE — Addendum Note (Signed)
Addended by: Cristopher Estimable on: 09/14/2022 10:21 AM   Modules accepted: Orders

## 2022-09-14 NOTE — Patient Instructions (Addendum)
  Lab Work:  Your physician recommends that you return for lab work in: ONE WEEK-FASTING-DO NOT [place]   If you have labs (blood work) drawn today and your tests are completely normal, you will receive your results only by: [place] (if you have [place]) OR A paper copy in the mail If you have any lab test that is abnormal or we need to change your treatment, we will call you to review the results.   Follow-Up: At [facility] [address], you and your health needs are our priority.  As part of our continuing mission to provide you with exceptional heart care, we have created designated Provider Care Teams.  These Care Teams include your primary Cardiologist (physician) and Advanced Practice Providers (APPs -  Physician Assistants and Nurse Practitioners) who all work together to provide you with the care you need, when you need it.  We recommend signing up for the patient portal called "MyChart".  Sign up information is provided on this After Visit Summary.  MyChart is used to connect with patients for Virtual Visits (Telemedicine).  Patients are able to view lab/test results, encounter notes, upcoming appointments, etc.  Non-urgent messages can be sent to your provider as well.   To learn more about what you can do with MyChart, go to [email or web address].    Your next appointment:   6 month(s)  The format for your next appointment:   In Person  Provider:   Kirk Ruths, MD

## 2022-09-15 LAB — COMPREHENSIVE METABOLIC PANEL
ALT: 25 IU/L (ref 0–44)
AST: 43 IU/L — ABNORMAL HIGH (ref 0–40)
Albumin/Globulin Ratio: 1.2 (ref 1.2–2.2)
Albumin: 4.3 g/dL (ref 3.8–4.8)
Alkaline Phosphatase: 131 IU/L — ABNORMAL HIGH (ref 44–121)
BUN/Creatinine Ratio: 13 (ref 10–24)
BUN: 17 mg/dL (ref 8–27)
Bilirubin Total: 0.7 mg/dL (ref 0.0–1.2)
CO2: 28 mmol/L (ref 20–29)
Calcium: 9.1 mg/dL (ref 8.6–10.2)
Chloride: 102 mmol/L (ref 96–106)
Creatinine, Ser: 1.27 mg/dL (ref 0.76–1.27)
Globulin, Total: 3.5 g/dL (ref 1.5–4.5)
Glucose: 99 mg/dL (ref 70–99)
Potassium: 4.3 mmol/L (ref 3.5–5.2)
Sodium: 143 mmol/L (ref 134–144)
Total Protein: 7.8 g/dL (ref 6.0–8.5)
eGFR: 57 mL/min/{1.73_m2} — ABNORMAL LOW (ref >59)

## 2022-09-15 LAB — LIPID PANEL
Chol/HDL Ratio: 2.3 ratio (ref 0.0–5.0)
Cholesterol, Total: 124 mg/dL (ref 100–199)
HDL: 54 mg/dL (ref >39)
LDL Chol Calc (NIH): 54 mg/dL (ref 0–99)
Triglycerides: 78 mg/dL (ref 0–149)
VLDL Cholesterol Cal: 16 mg/dL (ref 5–40)

## 2022-09-15 LAB — DIGOXIN LEVEL: Digoxin, Serum: 0.7 ng/mL (ref 0.5–0.9)

## 2022-09-18 ENCOUNTER — Encounter: Payer: Self-pay | Admitting: *Deleted

## 2022-09-18 ENCOUNTER — Ambulatory Visit: Payer: Medicare Other | Attending: Internal Medicine

## 2022-09-18 DIAGNOSIS — I4819 Other persistent atrial fibrillation: Secondary | ICD-10-CM

## 2022-09-19 ENCOUNTER — Ambulatory Visit (INDEPENDENT_AMBULATORY_CARE_PROVIDER_SITE_OTHER): Payer: Medicare Other

## 2022-09-19 DIAGNOSIS — I4819 Other persistent atrial fibrillation: Secondary | ICD-10-CM | POA: Diagnosis not present

## 2022-09-19 LAB — CUP PACEART REMOTE DEVICE CHECK
Battery Remaining Longevity: 86 mo
Battery Remaining Percentage: 72 %
Battery Voltage: 3.01 V
Brady Statistic AP VP Percent: 0 %
Brady Statistic AP VS Percent: 0 %
Brady Statistic AS VP Percent: 0 %
Brady Statistic AS VS Percent: 0 %
Brady Statistic RA Percent Paced: 0 %
Brady Statistic RV Percent Paced: 1 %
Date Time Interrogation Session: 20231003020016
Implantable Lead Implant Date: 20090420
Implantable Lead Implant Date: 20090420
Implantable Lead Location: 753859
Implantable Lead Location: 753860
Implantable Pulse Generator Implant Date: 20200708
Lead Channel Impedance Value: 440 Ohm
Lead Channel Impedance Value: 460 Ohm
Lead Channel Pacing Threshold Amplitude: 0.75 V
Lead Channel Pacing Threshold Amplitude: 1 V
Lead Channel Pacing Threshold Pulse Width: 0.4 ms
Lead Channel Pacing Threshold Pulse Width: 0.4 ms
Lead Channel Sensing Intrinsic Amplitude: 0.5 mV
Lead Channel Sensing Intrinsic Amplitude: 6.3 mV
Lead Channel Setting Pacing Amplitude: 2 V
Lead Channel Setting Pacing Amplitude: 2.5 V
Lead Channel Setting Pacing Pulse Width: 0.4 ms
Lead Channel Setting Sensing Sensitivity: 2 mV
Pulse Gen Model: 2272
Pulse Gen Serial Number: 9149180

## 2022-09-19 LAB — DIGOXIN LEVEL: Digoxin, Serum: 0.7 ng/mL (ref 0.5–0.9)

## 2022-09-20 ENCOUNTER — Telehealth: Payer: Self-pay

## 2022-09-20 NOTE — Telephone Encounter (Signed)
-----   Message from Evans Lance, MD sent at 09/19/2022  9:01 PM EDT ----- Continue digoxin at current dose

## 2022-09-20 NOTE — Telephone Encounter (Signed)
Pt called, and spoke to spouse regarding Dr. Tanna Furry communication.    Mrs. Helfman told to tell her husband to continue taking his digoxin at the current dose of 0.125 Mg, as ordered by Dr. Lovena Le.   Mrs. Montijo understood, and stated she would communicate this to her husband.

## 2022-09-28 ENCOUNTER — Other Ambulatory Visit: Payer: Self-pay | Admitting: Adult Health

## 2022-09-29 NOTE — Progress Notes (Signed)
Remote pacemaker transmission.   

## 2022-10-02 ENCOUNTER — Telehealth: Payer: Self-pay | Admitting: Cardiology

## 2022-10-02 NOTE — Telephone Encounter (Signed)
Returned the call to the patient's wife, per the dpr. She was calling for clarification on the Farxiga. She stated that they did not pick it up from the pharmacy due to cost. She has been advised that this medication was not needed at the time and to not get it unless she hears differently from the office.   Per the last office note: I have not added SGLT2 inhibitor as he has urinary incontinence and I am concerned about the risk of infection.

## 2022-10-02 NOTE — Telephone Encounter (Signed)
Pt c/o medication issue:  1. Name of Medication: Farxiga   2. How are you currently taking this medication (dosage and times per day)?   3. Are you having a reaction (difficulty breathing--STAT)?   4. What is your medication issue? Patient's wife is requesting call back to discuss this medication to see why this was sent to the pharmacy for her husband. She says she is confused as to why this would be called in for him. Please advise.

## 2022-10-03 NOTE — Telephone Encounter (Signed)
Left message for patients wife, the patient does not need to take farxiga.

## 2022-10-10 ENCOUNTER — Other Ambulatory Visit (HOSPITAL_COMMUNITY): Payer: Self-pay | Admitting: Internal Medicine

## 2022-10-10 ENCOUNTER — Telehealth: Payer: Self-pay | Admitting: *Deleted

## 2022-10-10 DIAGNOSIS — N189 Chronic kidney disease, unspecified: Secondary | ICD-10-CM | POA: Diagnosis not present

## 2022-10-10 DIAGNOSIS — R1900 Intra-abdominal and pelvic swelling, mass and lump, unspecified site: Secondary | ICD-10-CM

## 2022-10-10 DIAGNOSIS — N1831 Chronic kidney disease, stage 3a: Secondary | ICD-10-CM | POA: Diagnosis not present

## 2022-10-10 NOTE — Telephone Encounter (Signed)
Modafinil PA, Key: [id number] , unable to complete. Cannot find matching patient with Name and Date of Birth provided. [place], spoke with June who stated this plan doesn't cover drugs, only medical. Called patient, reached wife, on [place] and requested drug card information. Medicare Rx, [place], [place] [id number], bin [phone], pcn [id number], grp [id number], customer service [phone], providers [phone], pharmacy [phone].  Started new PA on CMM, Cannot find matching patient with Name and Date of Birth provided. Called pharmacy help line spoke with Micah, initiated PA , answered clinical questions. Approved until 04/11/23 [id number]. Called wife and advised her modafinil was approved. She verbalized understanding, appreciation.

## 2022-10-12 ENCOUNTER — Ambulatory Visit (INDEPENDENT_AMBULATORY_CARE_PROVIDER_SITE_OTHER): Payer: Medicare Other | Admitting: Podiatry

## 2022-10-12 ENCOUNTER — Ambulatory Visit (INDEPENDENT_AMBULATORY_CARE_PROVIDER_SITE_OTHER): Payer: Medicare Other

## 2022-10-12 ENCOUNTER — Ambulatory Visit: Payer: Medicare Other

## 2022-10-12 DIAGNOSIS — I739 Peripheral vascular disease, unspecified: Secondary | ICD-10-CM

## 2022-10-12 DIAGNOSIS — M79672 Pain in left foot: Secondary | ICD-10-CM

## 2022-10-12 DIAGNOSIS — L97511 Non-pressure chronic ulcer of other part of right foot limited to breakdown of skin: Secondary | ICD-10-CM | POA: Diagnosis not present

## 2022-10-12 DIAGNOSIS — L97521 Non-pressure chronic ulcer of other part of left foot limited to breakdown of skin: Secondary | ICD-10-CM | POA: Diagnosis not present

## 2022-10-12 DIAGNOSIS — M79671 Pain in right foot: Secondary | ICD-10-CM

## 2022-10-12 DIAGNOSIS — I2510 Atherosclerotic heart disease of native coronary artery without angina pectoris: Secondary | ICD-10-CM | POA: Diagnosis not present

## 2022-10-12 MED ORDER — SILVER SULFADIAZINE 1 % EX CREA
1.0000 | TOPICAL_CREAM | Freq: Every day | CUTANEOUS | 0 refills | Status: DC
Start: 2022-10-12 — End: 2023-04-03

## 2022-10-12 MED ORDER — DOXYCYCLINE HYCLATE 100 MG PO TABS: 100.0000 mg | ORAL_TABLET | Freq: Two times a day (BID) | ORAL | 0 refills | Status: DC

## 2022-10-12 NOTE — Patient Instructions (Signed)
Clean the wounds with saline. Apply a small amount of silvadene to the wound and cover with a bandage. Change daily. Wear surgical shoe on the right.  Monitor for any signs/symptoms of infection. Call the office immediately if any occur or go directly to the emergency room. Call with any questions/concerns.

## 2022-10-12 NOTE — Progress Notes (Signed)
Subjective: Chief Complaint  Patient presents with   Foot Ulcer    Right foot Ulcer hallux, started month ago, and left foot 2nd toe ulcer stared 5 days ago he dropped something on the foot, Patient denies any pain, "its uncomfortable    81-year-old male presents the above concerns.  He states that he dropped a tape meaure on the left 2nd toe, occurred 5 days ago.  No treatment.  Right big toe has a ulcer on the bottom, oozes a clear liquid at times. His wife noticed it a few days ago. Splinter redness.  Denies any systemic complaints such as fevers, chills, nausea, vomiting.  Objective: AAO x3, NAD DP/PT pulses palpable 1/4 b/l, CRT less than 3 seconds On the dorsal aspect left second toe appears to be superficial laceration with minimal edema.  Mild erythema as well but there is no drainage or pus.  No fluctuation or crepitation.  On the right foot plantar aspect of the hallux is a granular wound with some peeling skin noted.  This appears to be an old blister.  There is no probing, and or tunneling.  Mild edema. No pain with calf compression, swelling, warmth, erythema         Assessment: Ulcerations bilaterally  Plan: -All treatment options discussed with the patient including all alternatives, risks, complications.  -X-rays obtained reviewed.  3 views of the feet were obtained.  No subacute fracture, osteomyelitis or soft tissue emphysema. -I was able to debride the loose skin on the right hallux with any complications or bleeding.  Recommended surgical shoe on the right foot.  Wearing open toe shoe on the left foot as well to help take pressure off of this.  Prescribed doxycycline for 1 week.  Silvadene dressing changes daily. -Updated ABI ordered -Monitor for any clinical signs or symptoms of infection and directed to call the office immediately should any occur or go to the ER. -Patient encouraged to call the office with any questions, concerns, change in symptoms.    Trula Slade DPM

## 2022-10-16 DIAGNOSIS — N400 Enlarged prostate without lower urinary tract symptoms: Secondary | ICD-10-CM | POA: Diagnosis not present

## 2022-10-16 DIAGNOSIS — N1831 Chronic kidney disease, stage 3a: Secondary | ICD-10-CM | POA: Diagnosis not present

## 2022-10-16 DIAGNOSIS — E1122 Type 2 diabetes mellitus with diabetic chronic kidney disease: Secondary | ICD-10-CM | POA: Diagnosis not present

## 2022-10-16 DIAGNOSIS — D631 Anemia in chronic kidney disease: Secondary | ICD-10-CM | POA: Diagnosis not present

## 2022-10-16 DIAGNOSIS — I129 Hypertensive chronic kidney disease with stage 1 through stage 4 chronic kidney disease, or unspecified chronic kidney disease: Secondary | ICD-10-CM | POA: Diagnosis not present

## 2022-10-18 ENCOUNTER — Ambulatory Visit (HOSPITAL_COMMUNITY)
Admission: RE | Admit: 2022-10-18 | Discharge: 2022-10-18 | Disposition: A | Discharge status: Home / Self Care | Payer: Medicare Other | Source: Ambulatory Visit | Attending: Internal Medicine | Admitting: Internal Medicine

## 2022-10-18 DIAGNOSIS — R1900 Intra-abdominal and pelvic swelling, mass and lump, unspecified site: Secondary | ICD-10-CM | POA: Diagnosis not present

## 2022-10-18 DIAGNOSIS — K859 Acute pancreatitis without necrosis or infection, unspecified: Secondary | ICD-10-CM | POA: Diagnosis not present

## 2022-10-18 DIAGNOSIS — K5730 Diverticulosis of large intestine without perforation or abscess without bleeding: Secondary | ICD-10-CM | POA: Diagnosis not present

## 2022-10-18 DIAGNOSIS — K7460 Unspecified cirrhosis of liver: Secondary | ICD-10-CM | POA: Diagnosis not present

## 2022-10-19 ENCOUNTER — Ambulatory Visit (HOSPITAL_COMMUNITY)
Admission: RE | Admit: 2022-10-19 | Discharge: 2022-10-19 | Disposition: A | Discharge status: Home / Self Care | Payer: Medicare Other | Source: Ambulatory Visit | Attending: Podiatry | Admitting: Podiatry

## 2022-10-19 ENCOUNTER — Other Ambulatory Visit: Payer: Self-pay

## 2022-10-19 DIAGNOSIS — I739 Peripheral vascular disease, unspecified: Secondary | ICD-10-CM | POA: Insufficient documentation

## 2022-10-27 DIAGNOSIS — K7460 Unspecified cirrhosis of liver: Secondary | ICD-10-CM | POA: Diagnosis not present

## 2022-10-31 ENCOUNTER — Ambulatory Visit (INDEPENDENT_AMBULATORY_CARE_PROVIDER_SITE_OTHER): Payer: Medicare Other | Admitting: Podiatry

## 2022-10-31 DIAGNOSIS — I2510 Atherosclerotic heart disease of native coronary artery without angina pectoris: Secondary | ICD-10-CM

## 2022-10-31 DIAGNOSIS — L97521 Non-pressure chronic ulcer of other part of left foot limited to breakdown of skin: Secondary | ICD-10-CM

## 2022-10-31 DIAGNOSIS — L97511 Non-pressure chronic ulcer of other part of right foot limited to breakdown of skin: Secondary | ICD-10-CM

## 2022-10-31 NOTE — Progress Notes (Signed)
Subjective: Chief Complaint  Patient presents with   Foot Ulcer    Right foot hallux toe ulcer, patient denies any pain, he is having some drainage and bleeding,     81 year old male presents the above concerns.  He states he is doing better.  Only some slight drainage.  Continue daily dressing changes.  Denies any fevers or chills.   Objective: AAO x3, NAD DP/PT pulses palpable 1/4 b/l, CRT less than 3 seconds On the plantar aspect the right hallux is a granular wound today measuring 0.6 x 0.5 cm and superficial with a granular wound base.  There is no probing, and or tunneling.  No straight erythema, ascending cellulitis.  No fluctuation or crepitation.  No.  On the left side there is scabbing present on the toe.  There is no drainage or pus.  No fluctuation or crepitation. No pain with calf compression, swelling, warmth, erythema   Assessment: Ulcerations bilaterally-with improvement  Plan: -All treatment options discussed with the patient including all alternatives, risks, complications.  -There is no significant tissue to debride today.  Continue daily dressing changes with Silvadene.  Continue offloading at all times.  He is to monitor very closely signs or symptoms of infection. -Reviewed ABI.  Trula Slade DPM

## 2022-11-13 DIAGNOSIS — M810 Age-related osteoporosis without current pathological fracture: Secondary | ICD-10-CM | POA: Diagnosis not present

## 2022-11-21 ENCOUNTER — Ambulatory Visit (INDEPENDENT_AMBULATORY_CARE_PROVIDER_SITE_OTHER): Payer: Medicare Other | Admitting: Podiatry

## 2022-11-21 DIAGNOSIS — L97921 Non-pressure chronic ulcer of unspecified part of left lower leg limited to breakdown of skin: Secondary | ICD-10-CM | POA: Diagnosis not present

## 2022-11-21 DIAGNOSIS — L97511 Non-pressure chronic ulcer of other part of right foot limited to breakdown of skin: Secondary | ICD-10-CM

## 2022-11-21 DIAGNOSIS — I2510 Atherosclerotic heart disease of native coronary artery without angina pectoris: Secondary | ICD-10-CM

## 2022-11-21 MED ORDER — DOXYCYCLINE HYCLATE 100 MG PO TABS: 100.0000 mg | ORAL_TABLET | Freq: Two times a day (BID) | ORAL | 0 refills | Status: DC

## 2022-11-21 NOTE — Progress Notes (Signed)
Subjective: Chief Complaint  Patient presents with   Foot Ulcer    Right  foot toe ulcer, left leg has a wounds      81 year old male presents the above concerns.  He is not sure how the wound started this morning going for the last couple of days.  There is bloody drainage but no pus.  No pain.  Denies any fevers or chills.  Objective: AAO x3, NAD DP/PT pulses palpable 1/4 b/l, CRT less than 3 seconds Superficial abrasion type lesions on the proximal left calf without any drainage or pus.  Erythema surrounding area without any fluctuation or crepitation but there is no malodor.  Superficial area skin breakdown right hallux without any drainage or pus. No pain with calf compression, swelling, warmth, erythema   Assessment: Left leg ulceration, ulceration right hallux  Plan: -All treatment options discussed with the patient including all alternatives, risks, complications.  -Recommended Silvadene to the wounds daily and daily dressing changes.  Partial soap and water.  Prescribed doxycycline.  Elevation. -Monitor for any clinical signs or symptoms of infection and directed to call the office immediately should any occur or go to the ER.  No follow-ups on file.  Trula Slade DPM

## 2022-12-14 ENCOUNTER — Ambulatory Visit (INDEPENDENT_AMBULATORY_CARE_PROVIDER_SITE_OTHER): Payer: Medicare Other | Admitting: Podiatry

## 2022-12-14 DIAGNOSIS — L97511 Non-pressure chronic ulcer of other part of right foot limited to breakdown of skin: Secondary | ICD-10-CM

## 2022-12-14 DIAGNOSIS — I2510 Atherosclerotic heart disease of native coronary artery without angina pectoris: Secondary | ICD-10-CM | POA: Diagnosis not present

## 2022-12-14 DIAGNOSIS — L97921 Non-pressure chronic ulcer of unspecified part of left lower leg limited to breakdown of skin: Secondary | ICD-10-CM | POA: Diagnosis not present

## 2022-12-19 ENCOUNTER — Ambulatory Visit (INDEPENDENT_AMBULATORY_CARE_PROVIDER_SITE_OTHER): Payer: Medicare Other

## 2022-12-19 DIAGNOSIS — I495 Sick sinus syndrome: Secondary | ICD-10-CM | POA: Diagnosis not present

## 2022-12-20 LAB — CUP PACEART REMOTE DEVICE CHECK
Battery Remaining Longevity: 83 mo
Battery Remaining Percentage: 69 %
Battery Voltage: 3.01 V
Brady Statistic AP VP Percent: 8.8 %
Brady Statistic AP VS Percent: 2.8 %
Brady Statistic AS VP Percent: 1 %
Brady Statistic AS VS Percent: 81 %
Brady Statistic RA Percent Paced: 1 %
Brady Statistic RV Percent Paced: 2.1 %
Date Time Interrogation Session: 20240102060149
Implantable Lead Connection Status: 753985
Implantable Lead Connection Status: 753985
Implantable Lead Implant Date: 20090420
Implantable Lead Implant Date: 20090420
Implantable Lead Location: 753859
Implantable Lead Location: 753860
Implantable Pulse Generator Implant Date: 20200708
Lead Channel Impedance Value: 440 Ohm
Lead Channel Impedance Value: 460 Ohm
Lead Channel Pacing Threshold Amplitude: 0.75 V
Lead Channel Pacing Threshold Amplitude: 1 V
Lead Channel Pacing Threshold Pulse Width: 0.4 ms
Lead Channel Pacing Threshold Pulse Width: 0.4 ms
Lead Channel Sensing Intrinsic Amplitude: 1.1 mV
Lead Channel Sensing Intrinsic Amplitude: 6.1 mV
Lead Channel Setting Pacing Amplitude: 2 V
Lead Channel Setting Pacing Amplitude: 2.5 V
Lead Channel Setting Pacing Pulse Width: 0.4 ms
Lead Channel Setting Sensing Sensitivity: 2 mV
Pulse Gen Model: 2272
Pulse Gen Serial Number: 9149180

## 2022-12-20 NOTE — Progress Notes (Signed)
Subjective: Chief Complaint  Patient presents with   routine foot care      82 year old male presents the office today for follow evaluation of wound on the left leg.  He states the wound is healed.  He has no new lesions that he reports.  Denies any fever or chills.  He has no other concerns today.  Objective: AAO x3, NAD DP/PT pulses palpable 1/4 b/l, CRT less than 3 seconds The wound that was present in the left leg has resolved.  There is no ulcerations noted bilaterally there is no drainage or pus.  No fluctuance or crepitation there is no malodor. The toenails are not significantly elongated.  There is no edema, erythema.  Digital contractures present. No pain with calf compression, swelling, warmth, erythema   Assessment: Left leg ulceration, ulceration right hallux-  Plan -Wounds of all healed and currently there is no skin breakdown.  Discussed again monitor for any reoccurrence and the importance of daily foot inspection. As a courtesy I sharply debrided the nails and complications or bleeding but they are not significantly elongated. -Monitor for any clinical signs or symptoms of infection and directed to call the office immediately should any occur or go to the ER.  Trula Slade DPM

## 2022-12-25 DIAGNOSIS — D509 Iron deficiency anemia, unspecified: Secondary | ICD-10-CM | POA: Diagnosis not present

## 2022-12-25 DIAGNOSIS — R1310 Dysphagia, unspecified: Secondary | ICD-10-CM | POA: Diagnosis not present

## 2022-12-25 DIAGNOSIS — Z86010 Personal history of colonic polyps: Secondary | ICD-10-CM | POA: Diagnosis not present

## 2022-12-25 DIAGNOSIS — K7460 Unspecified cirrhosis of liver: Secondary | ICD-10-CM | POA: Diagnosis not present

## 2022-12-26 ENCOUNTER — Other Ambulatory Visit: Payer: Self-pay | Admitting: Gastroenterology

## 2022-12-26 DIAGNOSIS — K7460 Unspecified cirrhosis of liver: Secondary | ICD-10-CM

## 2023-01-06 ENCOUNTER — Other Ambulatory Visit: Payer: Self-pay | Admitting: Internal Medicine

## 2023-01-06 DIAGNOSIS — I4819 Other persistent atrial fibrillation: Secondary | ICD-10-CM

## 2023-01-17 ENCOUNTER — Ambulatory Visit
Admission: RE | Admit: 2023-01-17 | Discharge: 2023-01-17 | Disposition: A | Discharge status: Home / Self Care | Payer: Medicare Other | Source: Ambulatory Visit | Attending: Gastroenterology | Admitting: Gastroenterology

## 2023-01-17 DIAGNOSIS — Z9049 Acquired absence of other specified parts of digestive tract: Secondary | ICD-10-CM | POA: Diagnosis not present

## 2023-01-17 DIAGNOSIS — K7460 Unspecified cirrhosis of liver: Secondary | ICD-10-CM | POA: Diagnosis not present

## 2023-01-17 DIAGNOSIS — Z9889 Other specified postprocedural states: Secondary | ICD-10-CM | POA: Diagnosis not present

## 2023-01-17 DIAGNOSIS — N281 Cyst of kidney, acquired: Secondary | ICD-10-CM | POA: Diagnosis not present

## 2023-01-17 NOTE — Progress Notes (Signed)
Remote pacemaker transmission.   

## 2023-02-13 DIAGNOSIS — H43811 Vitreous degeneration, right eye: Secondary | ICD-10-CM | POA: Diagnosis not present

## 2023-02-13 DIAGNOSIS — Z961 Presence of intraocular lens: Secondary | ICD-10-CM | POA: Diagnosis not present

## 2023-02-13 DIAGNOSIS — H35372 Puckering of macula, left eye: Secondary | ICD-10-CM | POA: Diagnosis not present

## 2023-02-13 DIAGNOSIS — H353131 Nonexudative age-related macular degeneration, bilateral, early dry stage: Secondary | ICD-10-CM | POA: Diagnosis not present

## 2023-02-18 NOTE — Progress Notes (Unsigned)
Synopsis: Referred in March 2024 for acute visit by [name], MD  Subjective:   PATIENT ID: [name]. GENDER: male DOB: 02-28-1941, MRN: [id number]  Chief Complaint  Patient presents with   Acute Visit    SOB, wheezing, cough    This is an 82 year old gentleman, past medical history of heart failure with preserved ejection fraction, atrial fibrillation, CAD, diabetes, hyperlipidemia, hypertension, [name], OSA, pacemaker placement history of a subdural hematoma.Several years ago he had CT axial imaging of the chest concerning for UIP pattern.  He had pulmonary function tests in 2020.  He had an autoimmune workup in 2014 that was negative.  He had a TLC of 63% in 2014 and a reduced DLCO.  He had progression from 20 14-20 20.  He has not been on any antifibrotic's now presents with shortness of breath with exertion and cough.    Past Medical History:  Diagnosis Date   (HFpEF) heart failure with preserved ejection fraction ([place])    a. 05/2013 Echo: EF 55%, mild LVH, diast dysfxn, Ao sclerosis, mildly dil LA, RV dysfxn (poorly visualized), PASP 55mHg;  b. 03/2017 Echo: EF 55-60%, no rwma, triv MR, mildly dil RV, mod TR, PASP 436mg.   [place]   s/p Cox Maze 1/09;  Multaq Rx d/c'd in 2014 due to pulmo fibrosis;  coumadin d/c'd in 2014 due to spontaneous subdural hematoma   BPH (benign prostatic hyperplasia)    CAD (coronary artery disease), native coronary artery    a. s/p CABG 12/2007;  b. Myoview 12/2011: EF 66%, no scar or ischemia; normal.   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 Jude   Peripheral neuropathy 07/31/2018   Pulmonary fibrosis ([facility]   Multaq d/c'd 7/14   Subdural hematoma ([facility]/2013   spontaneous;  coumadin d/c'd => no longer a candidate for anticoagulation     Family  History  Problem Relation Age of Onset   Heart disease Father    Heart attack Father    Heart failure Father    Heart disease Mother    Alzheimer's disease Mother    Dementia Mother      Past Surgical History:  Procedure Laterality Date   AMPUTATION Left 05/17/2019   Procedure: AMPUTATION LEFT FOURTH TOE;  Surgeon: DeMeredith PelMD;  [facility];  Laterality: Left;   AMPUTATION TOE Right 02/06/2020   Procedure: AMPUTATION TOE;  Surgeon: DuNewt MinionMD;  [facility];  Laterality: Right;   AMPUTATION TOE Right 08/17/2020   Procedure: AMPUTATION TOE 4th toe;  Surgeon: WaTrula SladeDPM;  Location: [facility];  Service: Podiatry;  Laterality: Right;   APPENDECTOMY     CHOLECYSTECTOMY     CORONARY ARTERY BYPASS GRAFT     x3 (left internal mammary artery to distal left anterior descending coronary artery, saphenous vain graft to second circumflex marginal branch, saphenous vain graft to posterior descending coronary artery, endoscopic saphenous vain harvest from right thigh) and modified Cox - Maze IV procedure.  ClValentina GuOwen,MD. Electronically signed CHO/MEDQ D: 01/09/2008/ JOB: [id number]:  JoIran SizerD   CRANIOTOMY  07/30/2012   Procedure: CRANIOTOMY HEMATOMA EVACUATION SUBDURAL;  Surgeon: GaElaina HoopsMD;  [facility];  Service: Neurosurgery;  Laterality: Right;  Right craniotomy for evacuation of subdural hematoma   FOOT SURGERY     HERNIA REPAIR     INTRAMEDULLARY (IM) NAIL INTERTROCHANTERIC Right 02/04/2020   Procedure: INTRAMEDULLARY (IM) NAIL INTERTROCHANTRIC;  Surgeon: Netta Cedars, MD;  Location: [place];  Service: Orthopedics;  Laterality: Right;   ORCHIECTOMY     Left  /  testicular cancer   PACEMAKER PLACEMENT     PPM - St. Jude   PPM GENERATOR CHANGEOUT N/A 06/25/2019   Procedure: PPM GENERATOR CHANGEOUT;  Surgeon: Evans Lance, MD;  Location: [facility];  Service: Cardiovascular;   Laterality: N/A;    Social History   Socioeconomic History   Marital status: Married    Spouse name: Immunologist   Number of children: 2   Years of education: Not on file   Highest education level: Not on file  Occupational History   Occupation: Retired- IT trainer  Tobacco Use   Smoking status: Former    Packs/day: 2.00    Years: 20.00    Total pack years: 40.00    Types: Cigarettes    Quit date: 02/21/1991    Years since quitting: 32.0   Smokeless tobacco: Never  Vaping Use   Vaping Use: Never used  Substance and Sexual Activity   Alcohol use: No    Alcohol/week: 0.0 standard drinks of alcohol   Drug use: No   Sexual activity: Not Currently  Other Topics Concern   Not on file  Social History Narrative   Lives with wife   Right handed    Married with two children.     He is a Engineer, structural.     Social Determinants of Health   Financial Resource Strain: Not on file  Food Insecurity: Not on file  Transportation Needs: Not on file  Physical Activity: Not on file  Stress: Not on file  Social Connections: Not on file  Intimate Partner Violence: Not on file     Allergies  Allergen Reactions   Aricept [Donepezil] Other (See Comments)    Hallucination    Baclofen Itching   Lipitor [Atorvastatin] Other (See Comments)    Stiff joints   Nsaids Other (See Comments)    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   Warfarin And Related Other (See Comments)    Stiff joints   Enbrel [Etanercept] Itching     Outpatient Medications Prior to Visit  Medication Sig Dispense Refill   allopurinol (ZYLOPRIM) 300 MG tablet Take 1 tablet (300 mg total) by mouth daily. 30 tablet 0   colchicine 0.6 MG tablet TAKE 1 TO 3 TABLETS AS NEEDED DAILY     digoxin (LANOXIN) 0.125 MG tablet TAKE ONE TABLET BY MOUTH MONDAY THROUGH FRIDAY. DO NOT TAKE ON SATURDAY OR SUNDAY. 90 tablet 3   doxycycline (VIBRA-TABS) 100 MG tablet Take 1 tablet (100 mg total) by mouth 2 (two) times daily. 14 tablet 0   ezetimibe  (ZETIA) 10 MG tablet Take 1 tablet (10 mg total) by mouth daily. 90 tablet 3   ferrous sulfate 325 (65 FE) MG tablet Take 1 tablet (325 mg total) by mouth daily with breakfast. 30 tablet 3   memantine (NAMENDA) 10 MG tablet Take 10 mg by mouth 2 (two) times daily.     metoprolol succinate (TOPROL-XL) 25 MG 24 hr tablet Take 0.5 tablets by mouth daily.     mirabegron ER (MYRBETRIQ) 50 MG TB24 tablet Take 1 tablet (50  mg total) by mouth daily. 30 tablet 3   modafinil (PROVIGIL) 100 MG tablet TAKE 1 TABLET BY MOUTH EVERY DAY 30 tablet 5   Multiple Vitamin (MULTIVITAMIN WITH MINERALS) TABS tablet Take 1 tablet by mouth daily.     pantoprazole (PROTONIX) 40 MG tablet Take 1 tablet (40 mg total) by mouth daily. 30 tablet 0   rosuvastatin (CRESTOR) 40 MG tablet Take 1 tablet (40 mg total) by mouth daily. 90 tablet 2   sertraline (ZOLOFT) 100 MG tablet Take 2 tablets (200 mg total) by mouth daily. (Patient taking differently: Take 150 mg by mouth daily. 1.5 tablets daily) 30 tablet 0   silver sulfADIAZINE (SILVADENE) 1 % cream Apply 1 Application topically daily. 50 g 0   tamsulosin (FLOMAX) 0.4 MG CAPS capsule Take 1 capsule (0.4 mg total) by mouth at bedtime. 30 capsule 1   Teriparatide, Recombinant, 620 MCG/2.48ML SOPN inject 20 mcg Subcutaneous Once a day (Patient taking differently: Takes injection twice a year.) 1 mL 11   torsemide (DEMADEX) 20 MG tablet Take 4 tablets (80 mg total) by mouth 2 (two) times daily. 160 mg BID X1WEEK THEN BACK TO '80MG'$  DAILY (Patient taking differently: Take 30 mg by mouth 2 (two) times daily. 02/14/22 states pcp told to take 1 1/2 tablets daily) 45 tablet 6   No facility-administered medications prior to visit.    Review of Systems  Constitutional:  Negative for chills, fever, malaise/fatigue and weight loss.  HENT:  Negative for hearing loss, sore throat and tinnitus.   Eyes:  Negative for blurred vision and double vision.  Respiratory:  Positive for cough and  shortness of breath. Negative for hemoptysis, sputum production, wheezing and stridor.   Cardiovascular:  Negative for chest pain, palpitations, orthopnea, leg swelling and PND.  Gastrointestinal:  Negative for abdominal pain, constipation, diarrhea, heartburn, nausea and vomiting.  Genitourinary:  Negative for dysuria, hematuria and urgency.  Musculoskeletal:  Negative for joint pain and myalgias.  Skin:  Negative for itching and rash.  Neurological:  Negative for dizziness, tingling, weakness and headaches.  Endo/Heme/Allergies:  Negative for environmental allergies. Does not bruise/bleed easily.  Psychiatric/Behavioral:  Negative for depression. The patient is not nervous/anxious and does not have insomnia.   All other systems reviewed and are negative.    Objective:  Physical Exam Vitals reviewed.  Constitutional:      General: He is not in acute distress.    Appearance: He is well-developed.  HENT:     Head: Normocephalic and atraumatic.  Eyes:     General: No scleral icterus.    Conjunctiva/sclera: Conjunctivae normal.     Pupils: Pupils are equal, round, and reactive to light.  Neck:     Vascular: No JVD.     Trachea: No tracheal deviation.  Cardiovascular:     Rate and Rhythm: Normal rate and regular rhythm.     Heart sounds: Normal heart sounds. No murmur heard. Pulmonary:     Effort: Pulmonary effort is normal. No tachypnea, accessory muscle usage or respiratory distress.     Breath sounds: No stridor. No wheezing, rhonchi or rales.     Comments: Bilateral inspiratory crackles Abdominal:     General: There is no distension.     Palpations: Abdomen is soft.     Tenderness: There is no abdominal tenderness.  Musculoskeletal:        General: No tenderness.     Cervical back: Neck supple.     Right lower leg: Edema present.  Left lower leg: Edema present.     Comments: Evidence of chronic venous stasis  Lymphadenopathy:     Cervical: No cervical adenopathy.   Skin:    General: Skin is warm and dry.     Capillary Refill: Capillary refill takes less than 2 seconds.     Findings: No rash.  Neurological:     Mental Status: He is alert and oriented to person, place, and time.  Psychiatric:        Behavior: Behavior normal.      Vitals:   02/19/23 1035  BP: 120/70  Pulse: 80  SpO2: 99%  Weight: 203 lb 6.4 oz (92.3 kg)  Height: '5\' 8"'$  (1.727 m)   99% on RA BMI Readings from Last 3 Encounters:  02/19/23 30.93 kg/m  09/14/22 27.73 kg/m  09/13/22 28.07 kg/m   Wt Readings from Last 3 Encounters:  02/19/23 203 lb 6.4 oz (92.3 kg)  09/14/22 182 lb 6.4 oz (82.7 kg)  09/13/22 184 lb 9.6 oz (83.7 kg)     CBC    Component Value Date/Time   WBC 3.9 (L) 05/10/2022 0917   RBC 3.36 (L) 05/10/2022 0917   HGB 10.1 (L) 05/10/2022 0917   HGB 9.4 (L) 09/09/2019 1528   HGB 11.5 (L) 07/26/2017 1500   HCT 30.5 (L) 05/10/2022 0917   HCT 33.9 (L) 11/07/2019 0933   HCT 34.9 (L) 07/26/2017 1500   PLT 87 (L) 05/10/2022 0917   PLT 82 (LL) 09/09/2019 1528   MCV 90.8 05/10/2022 0917   MCV 94 09/09/2019 1528   MCV 93.4 07/26/2017 1500   MCH 30.1 05/10/2022 0917   MCHC 33.1 05/10/2022 0917   RDW 16.4 (H) 05/10/2022 0917   RDW 16.4 (H) 09/09/2019 1528   RDW 17.5 (H) 07/26/2017 1500   LYMPHSABS 0.7 05/10/2022 0917   LYMPHSABS 1.1 07/26/2017 1500   MONOABS 0.2 05/10/2022 0917   MONOABS 0.2 07/26/2017 1500   EOSABS 0.1 05/10/2022 0917   EOSABS 0.2 07/26/2017 1500   BASOSABS 0.0 05/10/2022 0917   BASOSABS 0.0 07/26/2017 1500     Chest Imaging:  August 2022: CT chest with evidence of parenchymal pulmonary fibrosis dating back since 2014 has showed progression.  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 The patient's images have been independently reviewed by me.    Pulmonary Functions Testing Results:    Latest Ref Rng & Units 10/28/2019   11:12 AM  PFT Results  FVC-Pre L 3.05   FVC-Predicted Pre % 82   FVC-Post L 2.96   FVC-Predicted Post % 80   Pre  FEV1/FVC % % 85   Post FEV1/FCV % % 88   FEV1-Pre L 2.59   FEV1-Predicted Pre % 97   FEV1-Post L 2.60   DLCO uncorrected ml/min/mmHg 15.51   DLCO UNC% % 68   DLCO corrected ml/min/mmHg 19.26   DLCO COR %Predicted % 84   DLVA Predicted % 107   TLC L 5.27   TLC % Predicted % 81   RV % Predicted % 101     FeNO:   Pathology:   Echocardiogram:   Heart Catheterization:     Assessment & Plan:     ICD-10-CM   1. Pulmonary fibrosis (HCC)  J84.10 CT CHEST HIGH RESOLUTION    Pulmonary Function Test    2. Chronic cough  R05.3     3. DOE (dyspnea on exertion)  R06.09     4. SOB (shortness of breath)  R06.02       Discussion:  This is an 82 year old gentleman with ongoing chronic cough dyspnea on exertion.  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  He had a prior autoimmune workup that was negative.  Not on any antifibrotic's.  Plan: Will review case with our ILD director Dr. Chase Caller. I think he may be a candidate for antifibrotic since he has radiographic evidence that have shown progression. Will get repeat HRCT imaging since it has been greater than 2 years as well as pulmonary function test for comparison. If he had change or decline I do think he may benefit from drugs in the future to help decrease risk of exacerbation as well as decrease risk of symptoms related to cough. Return to clinic after CT and PFTs complete.   Current Outpatient Medications:    allopurinol (ZYLOPRIM) 300 MG tablet, Take 1 tablet (300 mg total) by mouth daily., Disp: 30 tablet, Rfl: 0   colchicine 0.6 MG tablet, TAKE 1 TO 3 TABLETS AS NEEDED DAILY, Disp: , Rfl:    digoxin (LANOXIN) 0.125 MG tablet, TAKE ONE TABLET BY MOUTH MONDAY THROUGH FRIDAY. DO NOT TAKE ON SATURDAY OR SUNDAY., Disp: 90 tablet, Rfl: 3   doxycycline (VIBRA-TABS) 100 MG tablet, Take 1 tablet (100 mg total) by mouth 2 (two) times daily., Disp: 14 tablet, Rfl: 0   ezetimibe (ZETIA) 10 MG tablet, Take 1 tablet (10  mg total) by mouth daily., Disp: 90 tablet, Rfl: 3   ferrous sulfate 325 (65 FE) MG tablet, Take 1 tablet (325 mg total) by mouth daily with breakfast., Disp: 30 tablet, Rfl: 3   memantine (NAMENDA) 10 MG tablet, Take 10 mg by mouth 2 (two) times daily., Disp: , Rfl:    metoprolol succinate (TOPROL-XL) 25 MG 24 hr tablet, Take 0.5 tablets by mouth daily., Disp: , Rfl:    mirabegron ER (MYRBETRIQ) 50 MG TB24 tablet, Take 1 tablet (50 mg total) by mouth daily., Disp: 30 tablet, Rfl: 3   modafinil (PROVIGIL) 100 MG tablet, TAKE 1 TABLET BY MOUTH EVERY DAY, Disp: 30 tablet, Rfl: 5   Multiple Vitamin (MULTIVITAMIN WITH MINERALS) TABS tablet, Take 1 tablet by mouth daily., Disp:  , Rfl:    pantoprazole (PROTONIX) 40 MG tablet, Take 1 tablet (40 mg total) by mouth daily., Disp: 30 tablet, Rfl: 0   rosuvastatin (CRESTOR) 40 MG tablet, Take 1 tablet (40 mg total) by mouth daily., Disp: 90 tablet, Rfl: 2   sertraline (ZOLOFT) 100 MG tablet, Take 2 tablets (200 mg total) by mouth daily. (Patient taking differently: Take 150 mg by mouth daily. 1.5 tablets daily), Disp: 30 tablet, Rfl: 0   silver sulfADIAZINE (SILVADENE) 1 % cream, Apply 1 Application topically daily., Disp: 50 g, Rfl: 0   tamsulosin (FLOMAX) 0.4 MG CAPS capsule, Take 1 capsule (0.4 mg total) by mouth at bedtime., Disp: 30 capsule, Rfl: 1   Teriparatide, Recombinant, 620 MCG/2.48ML SOPN, inject 20 mcg Subcutaneous Once a day (Patient taking differently: Takes injection twice a year.), Disp: 1 mL, Rfl: 11   torsemide (DEMADEX) 20 MG tablet, Take 4 tablets (80 mg total) by mouth 2 (two) times daily. 160 mg BID X1WEEK THEN BACK TO '80MG'$  DAILY (Patient taking differently: Take 30 mg by mouth 2 (two) times daily. 02/14/22 states pcp told to take 1 1/2 tablets daily), Disp: 45 tablet, Rfl: 6   Garner Nash, DO [facility] 02/19/2023 11:30 AM

## 2023-02-19 ENCOUNTER — Encounter: Payer: Self-pay | Admitting: Pulmonary Disease

## 2023-02-19 ENCOUNTER — Telehealth: Payer: Self-pay | Admitting: Internal Medicine

## 2023-02-19 ENCOUNTER — Ambulatory Visit (INDEPENDENT_AMBULATORY_CARE_PROVIDER_SITE_OTHER): Payer: Medicare Other | Admitting: Pulmonary Disease

## 2023-02-19 VITALS — BP 120/70 | HR 80 | Ht 68.0 in | Wt 203.4 lb

## 2023-02-19 DIAGNOSIS — R0609 Other forms of dyspnea: Secondary | ICD-10-CM | POA: Diagnosis not present

## 2023-02-19 DIAGNOSIS — J8410 Pulmonary fibrosis, unspecified: Secondary | ICD-10-CM

## 2023-02-19 DIAGNOSIS — G4733 Obstructive sleep apnea (adult) (pediatric): Secondary | ICD-10-CM

## 2023-02-19 DIAGNOSIS — R053 Chronic cough: Secondary | ICD-10-CM | POA: Diagnosis not present

## 2023-02-19 DIAGNOSIS — R0602 Shortness of breath: Secondary | ICD-10-CM

## 2023-02-19 NOTE — Patient Instructions (Addendum)
Thank you for visiting Dr. Valeta Harms at [facility]. Today we recommend the following:  Orders Placed This Encounter  Procedures   CT CHEST HIGH RESOLUTION   Pulmonary Function Test   After ct chest and PFTs  Return in about 6 weeks (around 04/02/2023) for w/ Dr. Valeta Harms .    Please do your part to reduce the spread of COVID-19.

## 2023-02-19 NOTE — Progress Notes (Signed)
HPI: FU CAD, CHF and atrial fibrillation. He underwent CABG (LIMA-LAD, SVG-OM 2, SVG-PDA) along with modified Cox Maze IV procedure in 12/2007. 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 Abdominal US 3/12: No aneurysm. 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 He underwent craniotomy and evacuation by Dr. Saintclair Halsted. He was taken off of Coumadin and no longer felt to be an anticoagulation candidate. Multaq DCed previously as felt causing pulmonary toxixity. Nuclear study 5/18 showed EF 59 with normal perfusion. Carotid Dopplers June 2020 showed 1 to 39% bilateral stenosis. Transcranial Dopplers June 2020 negative. Chest CT August 2022 showed pulmonary fibrosis, probable cirrhosis, enlarged spleen and enlarged pulmonary trunk suggestive of pulmonary hypertension. Monitor November 2022 showed atrial fibrillation rate mildly elevated. Metoprolol was increased.   Echocardiogram November 2022 showed normal LV function, flattened septum suggestive of volume overload, moderate right ventricular enlargement with moderate RV dysfunction, moderate pulmonary hypertension, mild left atrial enlargement, moderate right atrial enlargement, severe tricuspid regurgitation.  Patient seen by Dr. Lovena Le August 2023 and digoxin increased for improved rate control.  ABIs November 2023 normal.  Since last seen, he notes some increased dyspnea on exertion.  No orthopnea or PND.  Minimal pedal edema.  He denies exertional chest pain or syncope.  Current Outpatient Medications  Medication Sig Dispense Refill   allopurinol (ZYLOPRIM) 300 MG tablet Take 1 tablet (300 mg total) by mouth daily. 30 tablet 0   colchicine 0.6 MG tablet TAKE 1 TO 3 TABLETS AS NEEDED DAILY     digoxin (LANOXIN) 0.125 MG tablet TAKE ONE TABLET BY MOUTH MONDAY THROUGH FRIDAY. DO NOT TAKE ON SATURDAY OR SUNDAY. 90 tablet 3   doxycycline (VIBRA-TABS) 100 MG tablet Take 1 tablet (100 mg total)  by mouth 2 (two) times daily. 14 tablet 0   ezetimibe (ZETIA) 10 MG tablet Take 1 tablet (10 mg total) by mouth daily. 90 tablet 3   ferrous sulfate 325 (65 FE) MG tablet Take 1 tablet (325 mg total) by mouth daily with breakfast. 30 tablet 3   memantine (NAMENDA) 10 MG tablet Take 10 mg by mouth 2 (two) times daily.     metoprolol succinate (TOPROL-XL) 25 MG 24 hr tablet Take 0.5 tablets by mouth daily.     mirabegron ER (MYRBETRIQ) 50 MG TB24 tablet Take 1 tablet (50 mg total) by mouth daily. 30 tablet 3   modafinil (PROVIGIL) 100 MG tablet TAKE 1 TABLET BY MOUTH EVERY DAY 30 tablet 5   Multiple Vitamin (MULTIVITAMIN WITH MINERALS) TABS tablet Take 1 tablet by mouth daily.     pantoprazole (PROTONIX) 40 MG tablet Take 1 tablet (40 mg total) by mouth daily. 30 tablet 0   rosuvastatin (CRESTOR) 40 MG tablet Take 1 tablet (40 mg total) by mouth daily. 90 tablet 2   sertraline (ZOLOFT) 100 MG tablet Take 2 tablets (200 mg total) by mouth daily. (Patient taking differently: Take 150 mg by mouth daily. 1.5 tablets daily) 30 tablet 0   silver sulfADIAZINE (SILVADENE) 1 % cream Apply 1 Application topically daily. 50 g 0   tamsulosin (FLOMAX) 0.4 MG CAPS capsule Take 1 capsule (0.4 mg total) by mouth at bedtime. 30 capsule 1   Teriparatide, Recombinant, 620 MCG/2.48ML SOPN inject 20 mcg Subcutaneous Once a day (Patient taking differently: Takes injection twice a year.) 1 mL 11   torsemide (DEMADEX) 20 MG tablet Take 4 tablets (80 mg total) by mouth 2 (two) times daily. 160 mg BID X1WEEK  THEN BACK TO '80MG'$  DAILY (Patient taking differently: Take 30 mg by mouth 2 (two) times daily. 02/14/22 states pcp told to take 1 1/2 tablets daily) 45 tablet 6   No current facility-administered medications for this visit.     Past Medical History:  Diagnosis Date   (HFpEF) heart failure with preserved ejection fraction ([place])    a. 05/2013 Echo: EF 55%, mild LVH, diast dysfxn, Ao sclerosis, mildly dil LA, RV dysfxn  (poorly visualized), PASP 40mHg;  b. 03/2017 Echo: EF 55-60%, no rwma, triv MR, mildly dil RV, mod TR, PASP 472mg.   Atrial fibrillation ([facility]   s/p Cox Maze 1/09;  Multaq Rx d/c'd in 2014 due to pulmo fibrosis;  coumadin d/c'd in 2014 due to spontaneous subdural hematoma   BPH (benign prostatic hyperplasia)    CAD (coronary artery disease), native coronary artery    a. s/p CABG 12/2007;  b. Myoview 12/2011: EF 66%, no scar or ischemia; normal.   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 Jude   Peripheral neuropathy 07/31/2018   Pulmonary fibrosis ([facility]   Multaq d/c'd 7/14   Subdural hematoma ([facility]2013   spontaneous;  coumadin d/c'd => no longer a candidate for anticoagulation    Past Surgical History:  Procedure Laterality Date   AMPUTATION Left 05/17/2019   Procedure: AMPUTATION LEFT FOURTH TOE;  Surgeon: DeMeredith PelMD;  [facility];  Laterality: Left;   AMPUTATION TOE Right 02/06/2020   Procedure: AMPUTATION TOE;  Surgeon: DuNewt MinionMD;  [place] [facility];  Laterality: Right;   AMPUTATION TOE Right 08/17/2020   Procedure: AMPUTATION TOE 4th toe;  Surgeon: WaTrula SladeDPM;  Location: [facility];  Service: Podiatry;  Laterality: Right;   APPENDECTOMY     CHOLECYSTECTOMY     CORONARY ARTERY BYPASS GRAFT     x3 (left internal mammary artery to distal left anterior descending coronary artery, saphenous vain graft to second circumflex marginal branch, saphenous vain graft to posterior descending coronary artery, endoscopic saphenous vain harvest from right thigh) and modified Cox - Maze IV procedure.  ClValentina GuOwen,MD. Electronically signed CHO/MEDQ D: 01/09/2008/ JOB: [id number]:  JoIran SizerD   CRANIOTOMY  07/30/2012   Procedure: CRANIOTOMY HEMATOMA  EVACUATION SUBDURAL;  Surgeon: GaElaina HoopsMD;  [facility];  Service: Neurosurgery;  Laterality: Right;  Right craniotomy for evacuation of subdural hematoma   FOOT SURGERY     HERNIA REPAIR     INTRAMEDULLARY (IM) NAIL INTERTROCHANTERIC Right 02/04/2020   Procedure: INTRAMEDULLARY (IM) NAIL INTERTROCHANTRIC;  Surgeon: NoNetta CedarsMD;  [facility]: [facility]ics;  Laterality: Right;   ORCHIECTOMY     Left  /  testicular cancer   PACEMAKER PLACEMENT     PPM - St. Jude   PPM GENERATOR CHANGEOUT N/A 06/25/2019   Procedure: PPM GENERATOR CHANGEOUT;  Surgeon: TaEvans LanceMD;  [facility];  Service: Cardiovascular;  Laterality: N/A;    Social History   Socioeconomic History   Marital status: Married    Spouse name: Vikky   Number of children: 2   Years of education: Not on file   Highest education level: Not on file  Occupational  History   Occupation: Retired- IT trainer  Tobacco Use   Smoking status: Former    Packs/day: 2.00    Years: 20.00    Total pack years: 40.00    Types: Cigarettes    Quit date: 02/21/1991    Years since quitting: 32.0   Smokeless tobacco: Never  Vaping Use   Vaping Use: Never used  Substance and Sexual Activity   Alcohol use: No    Alcohol/week: 0.0 standard drinks of alcohol   Drug use: No   Sexual activity: Not Currently  Other Topics Concern   Not on file  Social History Narrative   Lives with wife   Right handed    Married with two children.     He is a Engineer, structural.     Social Determinants of Health   Financial Resource Strain: Not on file  Food Insecurity: Not on file  Transportation Needs: Not on file  Physical Activity: Not on file  Stress: Not on file  Social Connections: Not on file  Intimate Partner Violence: Not on file    Family History  Problem Relation Age of Onset   Heart disease Father    Heart attack Father    Heart failure Father    Heart disease Mother     Alzheimer's disease Mother    Dementia Mother     ROS: Intermittent nausea/vomiting but no fevers or chills, productive cough, hemoptysis, dysphasia, odynophagia, melena, hematochezia, dysuria, hematuria, rash, seizure activity, orthopnea, PND, pedal edema, claudication. Remaining systems are negative.  Physical Exam: Well-developed well-nourished in no acute distress.  Skin is warm and dry.  HEENT is normal.  Neck is supple.  Chest is clear to auscultation with normal expansion.  Cardiovascular exam is irregular Abdominal exam nontender or distended. No masses palpated. Extremities show trace edema. neuro grossly intact   A/P  1 chronic diastolic congestive heart failure-this is felt predominantly right-sided CHF felt secondary to pulmonary venous hypertension in combination with pulmonary fibrosis/sleep apnea.  Continue diuretic at present dose.  We have not added SGLT2 inhibitor as he has urinary incontinence and I am concerned about risk of infection.  Check potassium and renal function; check BNP.  We will also await recent echocardiogram that was ordered for increased dyspnea by pulmonary.  2 persistent atrial fibrillation-will continue digoxin and beta-blocker for rate control.  He is not a candidate for anticoagulation given history of spontaneous subdural hematoma.  Check digoxin level.  3 coronary artery disease-patient denies chest pain.  He is not on aspirin given history of subdural hematoma.  Will continue statin.  4 history of ectopic atrial tachycardia-continue beta-blocker.  5 hyperlipidemia-continue statin.  Check lipids and liver.  6 severe tricuspid regurgitation-this is felt likely secondary to previous pacemaker versus right ventricular enlargement from pulmonary hypertension.  Plan conservative measures given comorbidities.  7 status post pacemaker-followed by electrophysiology.  8 cirrhosis/pulmonary fibrosis/chronic anemia/thrombocytopenia-Per primary  care.  Kirk Ruths, MD

## 2023-02-19 NOTE — Telephone Encounter (Addendum)
Brad (cc front desk and Raquel Sarna)  Thaks for the question  Agree needs PFT - last 4  years ago - currently schedule mid-aprl 2024 Needs echo - last 18 months ago - > Raquel Sarna please order Needs HRCT - last > 2 years ago; currently scheduled mid - march GEt r ONO test on room air - Raquel Sarna please order Pls give me an appt with patient face to face or virtual but in march 2024 itself (no need to wait for PFT) - so can start conversation - can be virtual If no slot, I can add on and lmk

## 2023-02-21 ENCOUNTER — Telehealth: Payer: Self-pay | Admitting: Internal Medicine

## 2023-02-21 NOTE — Telephone Encounter (Signed)
HRCT is currently scheduled for 3/19. PFT scheduled for 4/17. Pt's OV with Dr. Chase Caller is 4/2 at 1:00.   Attempted to call pt but unable to reach. Left message for him to return call. Will place order for echo and ONO once pt has been spoken to about these.

## 2023-02-21 NOTE — Telephone Encounter (Signed)
Pt wife is returning Pt missed call

## 2023-02-21 NOTE — Telephone Encounter (Signed)
Called and spoke with patients wife. She verbalized understanding. Orders for ONO and echo have been placed.   Nothing further needed.

## 2023-02-26 ENCOUNTER — Encounter: Payer: Self-pay | Admitting: Cardiology

## 2023-02-26 ENCOUNTER — Ambulatory Visit: Payer: Medicare Other | Attending: Cardiology | Admitting: Cardiology

## 2023-02-26 VITALS — BP 120/68 | HR 78 | Ht 68.0 in | Wt 200.8 lb

## 2023-02-26 DIAGNOSIS — E7800 Pure hypercholesterolemia, unspecified: Secondary | ICD-10-CM

## 2023-02-26 DIAGNOSIS — I4819 Other persistent atrial fibrillation: Secondary | ICD-10-CM | POA: Insufficient documentation

## 2023-02-26 DIAGNOSIS — R0602 Shortness of breath: Secondary | ICD-10-CM

## 2023-02-26 NOTE — Patient Instructions (Signed)
  Lab Work:  Your physician recommends that you return for lab work WHEN FASTING  If you have labs (blood work) drawn today and your tests are completely normal, you will receive your results only by: [employer] (if you have [place]) OR A paper copy in the mail If you have any lab test that is abnormal or we need to change your treatment, we will call you to review the results.   Follow-Up: At [facility], you and your health needs are our priority.  As part of our continuing mission to provide you with exceptional heart care, we have created designated Provider Care Teams.  These Care Teams include your primary Cardiologist (physician) and Advanced Practice Providers (APPs -  Physician Assistants and Nurse Practitioners) who all work together to provide you with the care you need, when you need it.  We recommend signing up for the patient portal called "MyChart".  Sign up information is provided on this After Visit Summary.  MyChart is used to connect with patients for Virtual Visits (Telemedicine).  Patients are able to view lab/test results, encounter notes, upcoming appointments, etc.  Non-urgent messages can be sent to your provider as well.   To learn more about what you can do with MyChart, go to [email or web address].    Your next appointment:   6 month(s)  Provider:   Kirk Ruths, MD

## 2023-03-06 ENCOUNTER — Ambulatory Visit (HOSPITAL_COMMUNITY)
Admission: RE | Admit: 2023-03-06 | Discharge: 2023-03-06 | Disposition: A | Discharge status: Home / Self Care | Payer: Medicare Other | Source: Ambulatory Visit | Attending: Pulmonary Disease | Admitting: Pulmonary Disease

## 2023-03-06 DIAGNOSIS — J479 Bronchiectasis, uncomplicated: Secondary | ICD-10-CM | POA: Insufficient documentation

## 2023-03-06 DIAGNOSIS — J8410 Pulmonary fibrosis, unspecified: Secondary | ICD-10-CM

## 2023-03-06 DIAGNOSIS — J84112 Idiopathic pulmonary fibrosis: Secondary | ICD-10-CM | POA: Insufficient documentation

## 2023-03-06 DIAGNOSIS — K7460 Unspecified cirrhosis of liver: Secondary | ICD-10-CM | POA: Insufficient documentation

## 2023-03-06 DIAGNOSIS — I700 Atherosclerosis of aorta: Secondary | ICD-10-CM | POA: Insufficient documentation

## 2023-03-09 NOTE — Progress Notes (Signed)
Follow up to be scheduled with MR. I have reviewed images and there is progression of disease  Thanks,  BLI  Garner Nash, DO [facility] 03/09/2023 3:50 PM

## 2023-03-13 ENCOUNTER — Ambulatory Visit (HOSPITAL_COMMUNITY)
Admission: RE | Admit: 2023-03-13 | Discharge: 2023-03-13 | Disposition: A | Discharge status: Home / Self Care | Payer: Medicare Other | Source: Ambulatory Visit | Attending: Internal Medicine | Admitting: Internal Medicine

## 2023-03-13 DIAGNOSIS — R0609 Other forms of dyspnea: Secondary | ICD-10-CM | POA: Diagnosis not present

## 2023-03-13 DIAGNOSIS — G4730 Sleep apnea, unspecified: Secondary | ICD-10-CM | POA: Insufficient documentation

## 2023-03-13 DIAGNOSIS — I071 Rheumatic tricuspid insufficiency: Secondary | ICD-10-CM | POA: Insufficient documentation

## 2023-03-13 DIAGNOSIS — I10 Essential (primary) hypertension: Secondary | ICD-10-CM | POA: Diagnosis not present

## 2023-03-13 DIAGNOSIS — Z950 Presence of cardiac pacemaker: Secondary | ICD-10-CM | POA: Insufficient documentation

## 2023-03-13 DIAGNOSIS — E785 Hyperlipidemia, unspecified: Secondary | ICD-10-CM | POA: Insufficient documentation

## 2023-03-13 LAB — ECHOCARDIOGRAM COMPLETE
Calc EF: 68.1 %
S' Lateral: 2.7 cm
Single Plane A2C EF: 67.2 %
Single Plane A4C EF: 69.3 %

## 2023-03-13 MED ORDER — PERFLUTREN LIPID MICROSPHERE
1.0000 mL | INTRAVENOUS | Status: AC | PRN
  Administered 2023-03-13: 2 mL via INTRAVENOUS
  Filled 2023-03-13: qty 10

## 2023-03-13 NOTE — Progress Notes (Signed)
Echocardiogram 2D Echocardiogram has been performed.  [name] 03/13/2023, 3:35 PM

## 2023-03-14 NOTE — Progress Notes (Signed)
The right side of the heart is unders stress on echo. Will discuss on OV 03/20/23

## 2023-03-15 ENCOUNTER — Ambulatory Visit (INDEPENDENT_AMBULATORY_CARE_PROVIDER_SITE_OTHER): Payer: Medicare Other | Admitting: Podiatry

## 2023-03-15 DIAGNOSIS — R2681 Unsteadiness on feet: Secondary | ICD-10-CM | POA: Diagnosis not present

## 2023-03-15 DIAGNOSIS — I739 Peripheral vascular disease, unspecified: Secondary | ICD-10-CM

## 2023-03-15 DIAGNOSIS — B351 Tinea unguium: Secondary | ICD-10-CM | POA: Diagnosis not present

## 2023-03-15 DIAGNOSIS — E1149 Type 2 diabetes mellitus with other diabetic neurological complication: Secondary | ICD-10-CM | POA: Diagnosis not present

## 2023-03-15 NOTE — Progress Notes (Signed)
Spoke with pt's spouse ok per DPR and notified of results per Dr Chase Caller. She verbalized understanding and denied any questions. Will keep planned appt with MR 03/20/23 at 1 pm.

## 2023-03-19 NOTE — Progress Notes (Signed)
Subjective: Chief Complaint  Patient presents with   routine foot care      82 year old male presents the office today for diabetic foot exam and for nail trim.  Denies any recent ulcers or changes.  He states that his feet are "horrible".  He is not to be walking much.  His son is with him today.  Objective: AAO x3, NAD DP/PT pulses palpable 1/4 b/l, CRT less than 3 seconds Sensation decreased with Semmes Weinstein monofilament. There is no ulcerations noted on the feet today.  The nails are minimally elongated.  Distal contractures are present.  There is no area of tenderness noted today. No pain with calf compression, swelling, warmth, erythema   Assessment: Digital contracture, neuropathy  Plan -Encouraged to debride the nails with any complications or bleeding.  Discussed the importance of trying to stay active and try to walk is much as possible exercise the legs.  He did not try to work on this.  Offered physical therapy.  Discussed importance of daily foot inspection.  Return in about 3 months (around 06/15/2023).  Trula Slade DPM

## 2023-03-20 ENCOUNTER — Ambulatory Visit (INDEPENDENT_AMBULATORY_CARE_PROVIDER_SITE_OTHER): Payer: Medicare Other | Admitting: Internal Medicine

## 2023-03-20 ENCOUNTER — Telehealth: Payer: Self-pay | Admitting: Internal Medicine

## 2023-03-20 ENCOUNTER — Encounter: Payer: Self-pay | Admitting: Internal Medicine

## 2023-03-20 ENCOUNTER — Ambulatory Visit (INDEPENDENT_AMBULATORY_CARE_PROVIDER_SITE_OTHER): Payer: Medicare Other

## 2023-03-20 VITALS — BP 120/70 | HR 97 | Ht 68.0 in | Wt 210.0 lb

## 2023-03-20 DIAGNOSIS — J8410 Pulmonary fibrosis, unspecified: Secondary | ICD-10-CM | POA: Diagnosis not present

## 2023-03-20 DIAGNOSIS — R600 Localized edema: Secondary | ICD-10-CM

## 2023-03-20 DIAGNOSIS — I071 Rheumatic tricuspid insufficiency: Secondary | ICD-10-CM | POA: Diagnosis not present

## 2023-03-20 DIAGNOSIS — J849 Interstitial pulmonary disease, unspecified: Secondary | ICD-10-CM | POA: Diagnosis not present

## 2023-03-20 DIAGNOSIS — I519 Heart disease, unspecified: Secondary | ICD-10-CM

## 2023-03-20 DIAGNOSIS — G4733 Obstructive sleep apnea (adult) (pediatric): Secondary | ICD-10-CM | POA: Diagnosis not present

## 2023-03-20 DIAGNOSIS — L03119 Cellulitis of unspecified part of limb: Secondary | ICD-10-CM | POA: Diagnosis not present

## 2023-03-20 DIAGNOSIS — I495 Sick sinus syndrome: Secondary | ICD-10-CM | POA: Diagnosis not present

## 2023-03-20 DIAGNOSIS — R0609 Other forms of dyspnea: Secondary | ICD-10-CM

## 2023-03-20 LAB — CUP PACEART REMOTE DEVICE CHECK
Battery Remaining Longevity: 79 mo
Battery Remaining Percentage: 67 %
Battery Voltage: 3.01 V
Brady Statistic AP VP Percent: 26 %
Brady Statistic AP VS Percent: 11 %
Brady Statistic AS VP Percent: 3.1 %
Brady Statistic AS VS Percent: 47 %
Brady Statistic RA Percent Paced: 5.7 %
Brady Statistic RV Percent Paced: 7.2 %
Date Time Interrogation Session: 20240402054819
Implantable Lead Connection Status: 753985
Implantable Lead Connection Status: 753985
Implantable Lead Implant Date: 20090420
Implantable Lead Implant Date: 20090420
Implantable Lead Location: 753859
Implantable Lead Location: 753860
Implantable Pulse Generator Implant Date: 20200708
Lead Channel Impedance Value: 410 Ohm
Lead Channel Impedance Value: 440 Ohm
Lead Channel Pacing Threshold Amplitude: 0.75 V
Lead Channel Pacing Threshold Amplitude: 1 V
Lead Channel Pacing Threshold Pulse Width: 0.4 ms
Lead Channel Pacing Threshold Pulse Width: 0.4 ms
Lead Channel Sensing Intrinsic Amplitude: 1.1 mV
Lead Channel Sensing Intrinsic Amplitude: 5.7 mV
Lead Channel Setting Pacing Amplitude: 2 V
Lead Channel Setting Pacing Amplitude: 2.5 V
Lead Channel Setting Pacing Pulse Width: 0.4 ms
Lead Channel Setting Sensing Sensitivity: 2 mV
Pulse Gen Model: 2272
Pulse Gen Serial Number: 9149180

## 2023-03-20 LAB — BASIC METABOLIC PANEL
BUN: 16 mg/dL (ref 6–23)
CO2: 29 mEq/L (ref 19–32)
Calcium: 8.7 mg/dL (ref 8.4–10.5)
Chloride: 102 mEq/L (ref 96–112)
Creatinine, Ser: 1.25 mg/dL (ref 0.40–1.50)
GFR: 54.5 mL/min — ABNORMAL LOW (ref >60.00)
Glucose, Bld: 94 mg/dL (ref 70–99)
Potassium: 3.2 mEq/L — ABNORMAL LOW (ref 3.5–5.1)
Sodium: 138 mEq/L (ref 135–145)

## 2023-03-20 LAB — CBC WITH DIFFERENTIAL/PLATELET
Basophils Absolute: 0 10*3/uL (ref 0.0–0.1)
Basophils Relative: 1 % (ref 0.0–3.0)
Eosinophils Absolute: 0.1 10*3/uL (ref 0.0–0.7)
Eosinophils Relative: 2.8 % (ref 0.0–5.0)
HCT: 33.1 % — ABNORMAL LOW (ref 39.0–52.0)
Hemoglobin: 11.4 g/dL — ABNORMAL LOW (ref 13.0–17.0)
Lymphocytes Relative: 13.5 % (ref 12.0–46.0)
Lymphs Abs: 0.6 10*3/uL — ABNORMAL LOW (ref 0.7–4.0)
MCHC: 34.5 g/dL (ref 30.0–36.0)
MCV: 95.3 fl (ref 78.0–100.0)
Monocytes Absolute: 0.2 10*3/uL (ref 0.1–1.0)
Monocytes Relative: 5.2 % (ref 3.0–12.0)
Neutro Abs: 3.3 10*3/uL (ref 1.4–7.7)
Neutrophils Relative %: 77.5 % — ABNORMAL HIGH (ref 43.0–77.0)
Platelets: 106 10*3/uL — ABNORMAL LOW (ref 150.0–400.0)
RBC: 3.47 Mil/uL — ABNORMAL LOW (ref 4.22–5.81)
RDW: 16.2 % — ABNORMAL HIGH (ref 11.5–15.5)
WBC: 4.3 10*3/uL (ref 4.0–10.5)

## 2023-03-20 LAB — HEPATIC FUNCTION PANEL
ALT: 19 U/L (ref 0–53)
AST: 33 U/L (ref 0–37)
Albumin: 3.9 g/dL (ref 3.5–5.2)
Alkaline Phosphatase: 81 U/L (ref 39–117)
Bilirubin, Direct: 0.3 mg/dL (ref 0.0–0.3)
Total Bilirubin: 0.9 mg/dL (ref 0.2–1.2)
Total Protein: 7.8 g/dL (ref 6.0–8.3)

## 2023-03-20 LAB — CK: Total CK: 56 U/L (ref 7–232)

## 2023-03-20 LAB — BRAIN NATRIURETIC PEPTIDE: Pro B Natriuretic peptide (BNP): 169 pg/mL — ABNORMAL HIGH (ref 0.0–100.0)

## 2023-03-20 MED ORDER — CEPHALEXIN 500 MG PO CAPS: 500.0000 mg | ORAL_CAPSULE | Freq: Three times a day (TID) | ORAL | 0 refills | Status: DC

## 2023-03-20 NOTE — Progress Notes (Addendum)
OV 03/20/2023  Subjective:  Patient ID: Jamie Cole., male , DOB: [birth year] , age 82 y.o. , MRN: [id number] , ADDRESS: [address] [place] [address] PCP Merri Brunette, MD Patient Care Team: Merri Brunette, MD as PCP - General Jens Som Madolyn Frieze, MD as PCP - Cardiology (Cardiology) Jens Som Madolyn Frieze, MD as Consulting Physician (Cardiology) Irena Reichmann, DO as Referring Physician (Family Medicine)  This Provider for this visit: Treatment Team:  Attending Provider: Kalman Shan, MD    03/20/2023 -   Chief Complaint  Patient presents with   Consult    Pulm. fibrosis.     HPI [name]. 82 y.o. - Late entry note - referred by Dr Tonia Brooms for dyspnea and ILD concern. Prsens with wife who gives the independent history for patient. Patient is not much of a historian. Per wife insidious dysnea that is worseing ofevr 2 years. 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 He has significant cardiac history with pedal edema. ECHO with severe RV strain and tricuspid regurigation.  However, in this visit  hthere is concern for celulitis. He has hx of OSA with cPAP usage but does not use o2    HRCT 03/06/23   Narrative & Impression  CLINICAL DATA:  Interstitial lung disease.   EXAM: CT CHEST WITHOUT CONTRAST   TECHNIQUE: Multidetector CT imaging of the chest was performed following the standard protocol without intravenous contrast. High resolution imaging of the lungs, as well as inspiratory and expiratory imaging, was performed.   RADIATION DOSE REDUCTION: This exam was performed according to the departmental dose-optimization program which includes automated exposure control, adjustment of the mA and/or kV according to patient size and/or use of iterative reconstruction technique.   COMPARISON:  08/03/2021.   FINDINGS: Cardiovascular: Atherosclerotic calcification of the aorta and aortic valve. Enlarged pulmonic  trunk and heart. No pericardial effusion.   Mediastinum/Nodes: Mediastinal lymph nodes measure up to 10 mm in the high right paratracheal station, as before. Hilar regions are difficult to evaluate without IV contrast. No axillary adenopathy. Esophagus is grossly unremarkable.   Lungs/Pleura: Interval progression in peripheral and basilar predominant subpleural reticulation, ground-glass and traction bronchiectasis/bronchiolectasis. Interstitial thickening/coarsening in the lung bases. No definitive honeycombing. Calcified granulomas. Scattered fissural thickening bilaterally. No pleural fluid. No air trapping.   Upper Abdomen: Liver margin is irregular. Trace perihepatic ascites. Cholecystectomy. Visualized portions of the adrenal glands and right kidney are unremarkable. Hypodense and hyperdense lesions in the left kidney. No specific follow-up necessary. Spleen may be enlarged but is incompletely imaged. Visualized portions of the pancreas, stomach and bowel are grossly unremarkable. Ventral hernia repair. No upper abdominal adenopathy.   Musculoskeletal: Degenerative changes in the spine. No worrisome lytic or sclerotic lesions.   IMPRESSION: 1. Interval progression in fibrotic interstitial lung disease, detailed above. Patient reportedly has a known diagnosis of idiopathic pulmonary fibrosis, as indicated in the prior CT chest report. 2. Cirrhosis. 3. Trace perihepatic ascites. 4.  Aortic atherosclerosis (ICD10-I70.0). 5. Enlarged pulmonic trunk, indicative of pulmonary arterial hypertension.     Electronically Signed   By: Leanna Battles M.D.   On: 03/07/2023 16:17        ECHO 03/13/23    IMPRESSIONS     1. Left ventricular ejection fraction, by estimation, is 55 to 60%. The  left ventricle has normal function. The left ventricle has no regional  wall motion abnormalities. Left ventricular diastolic parameters  are  indeterminate. There is the   interventricular septum is flattened in diastole ('D' shaped left  ventricle), consistent with right ventricular volume overload.   2. Right ventricular systolic function is moderately reduced. The right  ventricular size is moderately enlarged. PASP likely underestimated due to  severity of TR.   3. Left atrial size was mildly dilated.   4. Right atrial size was moderately dilated.   5. The mitral valve is grossly normal. Trivial mitral valve  regurgitation.   6. Tricuspid valve regurgitation is severe.   7. The aortic valve is tricuspid. Aortic valve regurgitation is not  visualized. Aortic valve sclerosis/calcification is present, without any  evidence of aortic stenosis.   8. Aortic dilatation noted. There is borderline dilatation of the  ascending aorta, measuring 38 mm.   9. The inferior vena cava is dilated in size with <50% respiratory  variability, suggesting right atrial pressure of 15 mmHg.   Comparison(s): No significant change from prior TTE. There continues to be  severe TR with moderate RV dysfunction and enlargement. PASP likely  underestimated in the setting of severe TR.   PFT     Latest Ref Rng & Units 10/28/2019   11:12 AM  PFT Results  FVC-Pre L 3.05   FVC-Predicted Pre % 82   FVC-Post L 2.96   FVC-Predicted Post % 80   Pre FEV1/FVC % % 85   Post FEV1/FCV % % 88   FEV1-Pre L 2.59   FEV1-Predicted Pre % 97   FEV1-Post L 2.60   DLCO uncorrected ml/min/mmHg 15.51   DLCO UNC% % 68   DLCO corrected ml/min/mmHg 19.26   DLCO COR %Predicted % 84   DLVA Predicted % 107   TLC L 5.27   TLC % Predicted % 81   RV % Predicted % 101        has a past medical history of (HFpEF) heart failure with preserved ejection fraction, Atrial fibrillation, BPH (benign prostatic hyperplasia), CAD (coronary artery disease), native coronary artery, DM (diabetes mellitus), Hyperlipidemia type II, Hypertension, 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   reports that he quit smoking about 32 years ago. His smoking use included cigarettes. He has a 40.00 pack-year smoking history. He has never used smokeless tobacco.  Past Surgical History:  Procedure Laterality Date   AMPUTATION Left 05/17/2019   Procedure: AMPUTATION LEFT FOURTH TOE;  Surgeon: Cammy Copa, MD;  Location: [facility] OR;  Service: Orthopedics;  Laterality: Left;   AMPUTATION TOE Right 02/06/2020   Procedure: AMPUTATION TOE;  Surgeon: Nadara Mustard, MD;  Location: [facility] OR;  Service: Orthopedics;  Laterality: Right;   AMPUTATION TOE Right 08/17/2020   Procedure: AMPUTATION TOE 4th toe;  Surgeon: [name], DPM;  Location: [facility] ORS;  Service: Podiatry;  Laterality: Right;   APPENDECTOMY     CHOLECYSTECTOMY     CORONARY ARTERY BYPASS GRAFT     x3 (left internal mammary artery to distal left anterior descending coronary artery, saphenous vain graft to second circumflex marginal branch, saphenous vain graft to posterior descending coronary artery, endoscopic saphenous vain harvest from right thigh) and modified Cox - Maze IV procedure.  [name]. [name],MD. Electronically signed CHO/MEDQ D: 01/09/2008/ JOB: [id number] cc:  Antionette Char MD   CRANIOTOMY  07/30/2012   Procedure: CRANIOTOMY HEMATOMA EVACUATION SUBDURAL;  Surgeon: Mariam Dollar, MD;  Location: [facility] NEURO ORS;  Service:  Neurosurgery;  Laterality: Right;  Right craniotomy for evacuation of subdural hematoma   FOOT SURGERY     HERNIA REPAIR     INTRAMEDULLARY (IM) NAIL INTERTROCHANTERIC Right 02/04/2020   Procedure: INTRAMEDULLARY (IM) NAIL INTERTROCHANTRIC;  Surgeon: Beverely Low, MD;  Location: [facility] OR;  Service: Orthopedics;  Laterality: Right;   ORCHIECTOMY     Left  /  testicular cancer   PACEMAKER PLACEMENT     PPM - 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   Warfarin And Related Other (See Comments)    Stiff joints   Enbrel [Etanercept] Itching    Immunization History  Administered Date(s) Administered   Fluad Quad(high Dose 65+) 08/18/2019, 11/08/2021   Influenza, High Dose Seasonal PF 09/17/2018   PFIZER(Purple Top)SARS-COV-2 Vaccination 06/21/2020, 07/12/2020   Tdap 08/14/2014, 07/06/2019   Zoster Recombinat (Shingrix) 06/30/2019    Family History  Problem Relation Age of Onset   Heart disease Father    Heart attack Father    Heart failure Father    Heart disease Mother    Alzheimer's disease Mother    Dementia Mother      Current Outpatient Medications:    allopurinol (ZYLOPRIM) 300 MG tablet, Take 1 tablet (300 mg total) by mouth daily., Disp: 30 tablet, Rfl: 0   colchicine 0.6 MG tablet, TAKE 1 TO 3 TABLETS AS NEEDED DAILY, Disp: , Rfl:    digoxin (LANOXIN) 0.125 MG tablet, TAKE ONE TABLET BY MOUTH MONDAY THROUGH FRIDAY. DO NOT TAKE ON SATURDAY OR SUNDAY., Disp: 90 tablet, Rfl: 3   doxycycline (VIBRA-TABS) 100 MG tablet, Take 1 tablet (100 mg total) by mouth 2 (two) times daily., Disp: 14 tablet, Rfl: 0   ezetimibe (ZETIA) 10 MG tablet, Take 1 tablet (10 mg total) by mouth daily., Disp: 90 tablet, Rfl: 3   ferrous sulfate 325 (65 FE) MG tablet, Take 1 tablet (325 mg total) by mouth daily with breakfast., Disp: 30 tablet, Rfl: 3   memantine (NAMENDA) 10 MG tablet, Take 10 mg by mouth 2 (two) times daily., Disp: , Rfl:    metoprolol succinate (TOPROL-XL) 25 MG 24 hr tablet, Take 0.5 tablets by mouth daily., Disp: , Rfl:    mirabegron ER (MYRBETRIQ) 50 MG TB24 tablet, Take  1 tablet (50 mg total) by mouth daily., Disp: 30 tablet, Rfl: 3   modafinil (PROVIGIL) 100 MG tablet, TAKE 1 TABLET BY MOUTH EVERY DAY, Disp: 30 tablet, Rfl: 5   Multiple Vitamin (MULTIVITAMIN WITH MINERALS) TABS tablet, Take 1 tablet by mouth daily., Disp:  , Rfl:    pantoprazole (PROTONIX) 40 MG tablet, Take 1 tablet (40 mg total) by mouth daily., Disp: 30 tablet, Rfl: 0   rosuvastatin (CRESTOR) 40 MG tablet, Take 1 tablet (40 mg total) by mouth daily., Disp: 90 tablet, Rfl: 2   sertraline (ZOLOFT) 100 MG tablet, Take 2 tablets (200 mg total) by mouth daily. (Patient taking differently: Take 150 mg by mouth daily. 1.5 tablets daily), Disp: 30 tablet, Rfl: 0   silver sulfADIAZINE (SILVADENE) 1 % cream, Apply 1  Application topically daily., Disp: 50 g, Rfl: 0   tamsulosin (FLOMAX) 0.4 MG CAPS capsule, Take 1 capsule (0.4 mg total) by mouth at bedtime., Disp: 30 capsule, Rfl: 1   Teriparatide, Recombinant, 620 MCG/2.48ML SOPN, inject 20 mcg Subcutaneous Once a day (Patient taking differently: Takes injection twice a year.), Disp: 1 mL, Rfl: 11   torsemide (DEMADEX) 20 MG tablet, Take 4 tablets (80 mg total) by mouth 2 (two) times daily. 160 mg BID X1WEEK THEN BACK TO 80MG  DAILY (Patient taking differently: Take 30 mg by mouth 2 (two) times daily. 02/14/22 states pcp told to take 1 1/2 tablets daily), Disp: 45 tablet, Rfl: 6      Objective:   Vitals:   03/20/23 1310  BP: 120/70  Pulse: 97  SpO2: 97%  Weight: 210 lb (95.3 kg)  Height: 5\' 8"  (1.727 m)    Estimated body mass index is 31.93 kg/m as calculated from the following:   Height as of this encounter: 5\' 8"  (1.727 m).   Weight as of this encounter: 210 lb (95.3 kg).  @WEIGHTCHANGE @  [employer]   03/20/23 1310  Weight: 210 lb (95.3 kg)     Physical Exam    General: No distress. Chronically unwelll and decodnitoned loooking Neuro: Alert and Oriented x 3. GCS 15. Speech normal Psych: Pleasant Resp:  Barrel Chest - no.   Wheeze - no, Crackles - yes at base, No overt respiratory distress CVS: Normal heart sounds. Murmurs - no Ext: Stigmata of Connective Tissue Disease - no HEENT: Normal upper airway. PEERL +. No post nasal drip.   EDEMA - chronic venous stasis edema -> upto thigh and also in R > L thigh -> mildly warmn        Assessment:       ICD-10-CM   1. ILD (interstitial lung disease)  J84.9     2. Pulmonary fibrosis  J84.10     3. Pedal edema  R60.0     4. Severe tricuspid regurgitation  I07.1     5. Right ventricular dysfunction  I51.9     6. 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  You will need treatment but at this point we cannot start treatment till we finish workup  Plan - Take ILD questionnaire and complete it and bring it with you at next visit - Do full pulmonary function test - Complete all the workup as below - Do the following blood work today,-CBC with differential, chemistry, liver function test, QuantiFERON gold, BNP, ANA, double-stranded DNA, rheumatoid factor, CCP, SSA, SSB, SCL-70, hypersensitive pneumonitis profile, CK, aldolase -Do simple walking desaturation test today or at the next visit  Pedal edema Severe tricuspid regurgitation Right ventricular dysfunction  -  recommend right heart catheterization byt Dr Jacqulyn Cane or Dr Marca Ancona  - sending message to them   Hx of OSA  Plan  - continue CPAP - check ONO on room air while using CPAP  Cellulitis of thigh  - cephalexin 500mg  three times daily x  5 days - take pic - see Dr Renne Crigler asap - go to ER if worse   Follow-up - Return in the next 4-8 weeks for 30-minute visit with Dr. Marchelle Gearing to regroup and discuss   ( Level 05 visit: Estb 40-54 min   in  in total care time and counseling or/and coordination of care by this undersigned MD - Dr  Kalman Shan. This includes one or more of the following on this same day 03/20/2023: pre-charting, chart review, note writing, documentation discussion of test results, diagnostic or treatment recommendations, prognosis, risks and benefits of management options, instructions, education, compliance or risk-factor reduction. It excludes time spent by the CMA or office staff in the care of the patient. Actual time 45 min)    SIGNATURE    Dr. Kalman Shan, M.D., F.C.C.P,  Pulmonary and Critical Care Medicine Staff Physician, [facility] Center Director - Interstitial Lung Disease  Program  Pulmonary Fibrosis [facility] at [facility] [place], [place], [address]  Pager: [phone], If no answer or between  15:00h - 7:00h: call [phone] Telephone: [phone]  1:41 PM 03/20/2023

## 2023-03-20 NOTE — Patient Instructions (Addendum)
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  You will need treatment but at this point we cannot start treatment till we finish workup  Plan - Take ILD questionnaire and complete it and bring it with you at next visit - Do full pulmonary function test - Complete all the workup as below - Do the following blood work today,-CBC with differential, chemistry, liver function test, QuantiFERON gold, BNP, ANA, double-stranded DNA, rheumatoid factor, CCP, SSA, SSB, SCL-70, hypersensitive pneumonitis profile, CK, aldolase -Do simple walking desaturation test today or at the next visit  Pedal edema Severe tricuspid regurgitation Right ventricular dysfunction  -  recommend right heart catheterization byt Dr Jerilee Field or Dr Loralie Champagne  - sending message to them   Hx of OSA  Plan  - continue CPAP - check ONO on room air while using CPAP  Cellulitis of thigh  - cephalexin 500mg  three times daily x  5 days - take pic - see Dr Shelia Media asap - go to ER if worse   Follow-up - Return in the next 4-8 weeks for 30-minute visit with Dr. Chase Caller to regroup and discuss

## 2023-03-20 NOTE — Telephone Encounter (Addendum)
    [name]. Needs  a RHC. He has memory issues per wife and lot goin on and study coordinator off this week. So can be just done as standard of care. 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 Brand Males, M.D., F.C.C.P,  Pulmonary and Critical Care Medicine Staff Physician, [place] Director - Interstitial Lung Disease  Program  Medical Director - [facility] [place] [facility] [place], [place], [address]   Pager: [phone], If no answer  -[place] or [place] [phone] Telephone (clinical office): [phone] Telephone (research): [phone]  1:47 PM 03/20/2023 '

## 2023-03-22 LAB — QUANTIFERON-TB GOLD PLUS
Mitogen-NIL: 6.07 IU/mL
NIL: 0.03 IU/mL
QuantiFERON-TB Gold Plus: NEGATIVE
TB1-NIL: 0 IU/mL
TB2-NIL: 0 IU/mL

## 2023-03-22 LAB — CYCLIC CITRUL PEPTIDE ANTIBODY, IGG: Cyclic Citrullin Peptide Ab: <16 UNITS

## 2023-03-23 DIAGNOSIS — L03116 Cellulitis of left lower limb: Secondary | ICD-10-CM | POA: Diagnosis not present

## 2023-03-23 DIAGNOSIS — I509 Heart failure, unspecified: Secondary | ICD-10-CM | POA: Diagnosis not present

## 2023-03-23 DIAGNOSIS — L03115 Cellulitis of right lower limb: Secondary | ICD-10-CM | POA: Diagnosis not present

## 2023-03-23 LAB — HYPERSENSITIVITY PNEUMONITIS
A. Pullulans Abs: NEGATIVE
A.Fumigatus #1 Abs: NEGATIVE
Micropolyspora faeni, IgG: NEGATIVE
Pigeon Serum Abs: NEGATIVE
Thermoact. Saccharii: NEGATIVE
Thermoactinomyces vulgaris, IgG: NEGATIVE

## 2023-03-23 LAB — ANA+ENA+DNA/DS+SCL 70+SJOSSA/B
ANA Titer 1: NEGATIVE
ENA RNP Ab: 0.2 AI (ref 0.0–0.9)
ENA SM Ab Ser-aCnc: <0.2 AI (ref 0.0–0.9)
ENA SSA (RO) Ab: <0.2 AI (ref 0.0–0.9)
ENA SSB (LA) Ab: <0.2 AI (ref 0.0–0.9)
Scleroderma (Scl-70) (ENA) Antibody, IgG: 0.2 AI (ref 0.0–0.9)
dsDNA Ab: <1 IU/mL (ref 0–9)

## 2023-03-23 LAB — ALDOLASE: Aldolase: 4 U/L (ref <=8.1)

## 2023-03-23 NOTE — Telephone Encounter (Signed)
Attempted to call patient, left message for patient to call back to office.     Lewayne Bunting, MD  Freddi Starr, RN3 days ago    Schedule APPov to arrange RHC Olga Millers

## 2023-03-26 DIAGNOSIS — L03116 Cellulitis of left lower limb: Secondary | ICD-10-CM | POA: Diagnosis not present

## 2023-03-26 DIAGNOSIS — I509 Heart failure, unspecified: Secondary | ICD-10-CM | POA: Diagnosis not present

## 2023-03-27 NOTE — Telephone Encounter (Signed)
Follow up scheduled with NP 04/05/23.

## 2023-03-28 NOTE — Progress Notes (Addendum)
Bnp slightly high. Wil discsss reults at followup. Only thing potassium is some low. Needst  to talk to PCP or if on diuretics whoever is pescribing his diureteics

## 2023-03-29 DIAGNOSIS — E7800 Pure hypercholesterolemia, unspecified: Secondary | ICD-10-CM | POA: Diagnosis not present

## 2023-03-29 DIAGNOSIS — R0602 Shortness of breath: Secondary | ICD-10-CM | POA: Diagnosis not present

## 2023-03-29 DIAGNOSIS — I4819 Other persistent atrial fibrillation: Secondary | ICD-10-CM | POA: Diagnosis not present

## 2023-03-30 LAB — COMPREHENSIVE METABOLIC PANEL
ALT: 19 IU/L (ref 0–44)
AST: 34 IU/L (ref 0–40)
Albumin/Globulin Ratio: 1.1 — ABNORMAL LOW (ref 1.2–2.2)
Albumin: 4 g/dL (ref 3.8–4.8)
Alkaline Phosphatase: 93 IU/L (ref 44–121)
BUN/Creatinine Ratio: 20 (ref 10–24)
BUN: 31 mg/dL — ABNORMAL HIGH (ref 8–27)
Bilirubin Total: 0.6 mg/dL (ref 0.0–1.2)
CO2: 28 mmol/L (ref 20–29)
Calcium: 9.4 mg/dL (ref 8.6–10.2)
Chloride: 101 mmol/L (ref 96–106)
Creatinine, Ser: 1.55 mg/dL — ABNORMAL HIGH (ref 0.76–1.27)
Globulin, Total: 3.8 g/dL (ref 1.5–4.5)
Glucose: 101 mg/dL — ABNORMAL HIGH (ref 70–99)
Potassium: 3.6 mmol/L (ref 3.5–5.2)
Sodium: 145 mmol/L — ABNORMAL HIGH (ref 134–144)
Total Protein: 7.8 g/dL (ref 6.0–8.5)
eGFR: 45 mL/min/{1.73_m2} — ABNORMAL LOW (ref >59)

## 2023-03-30 LAB — PRO B NATRIURETIC PEPTIDE: NT-Pro BNP: 1955 pg/mL — ABNORMAL HIGH (ref 0–486)

## 2023-03-30 LAB — DIGOXIN LEVEL: Digoxin, Serum: 0.4 ng/mL — ABNORMAL LOW (ref 0.5–0.9)

## 2023-03-30 LAB — LIPID PANEL
Chol/HDL Ratio: 2.1 ratio (ref 0.0–5.0)
Cholesterol, Total: 97 mg/dL — ABNORMAL LOW (ref 100–199)
HDL: 47 mg/dL (ref >39)
LDL Chol Calc (NIH): 36 mg/dL (ref 0–99)
Triglycerides: 65 mg/dL (ref 0–149)
VLDL Cholesterol Cal: 14 mg/dL (ref 5–40)

## 2023-04-02 ENCOUNTER — Encounter: Payer: Self-pay | Admitting: *Deleted

## 2023-04-02 DIAGNOSIS — I509 Heart failure, unspecified: Secondary | ICD-10-CM | POA: Diagnosis not present

## 2023-04-02 DIAGNOSIS — I2721 Secondary pulmonary arterial hypertension: Secondary | ICD-10-CM | POA: Diagnosis not present

## 2023-04-02 DIAGNOSIS — J8410 Pulmonary fibrosis, unspecified: Secondary | ICD-10-CM | POA: Diagnosis not present

## 2023-04-02 DIAGNOSIS — N1831 Chronic kidney disease, stage 3a: Secondary | ICD-10-CM | POA: Diagnosis not present

## 2023-04-02 NOTE — Progress Notes (Unsigned)
PATIENT: [name]. DOB: [birth year]  REASON FOR VISIT: follow up HISTORY FROM: patient  Chief Complaint  Patient presents with   Follow-up    [name] in 19. Accompanied by [name], [name]. [name] here for CPAP f/u. Doing well on CPAP. FSS: 63 ESS: 24     HISTORY OF PRESENT ILLNESS: Today 04/03/23:  Jamie Cole. is a 82 y.o. male with a history of obstructive sleep apnea on CPAP and memory disturbance. Returns today for follow-up.   OSA on CPAP: Download is below.  Patient still struggles with good sleep hygiene therefore he does not get more than 4 hours of sleep at a time.  He continues on modafinil 100 mg during the day. [name] states that he pays no attention to the time. She states that he sleeps about 16 hours a day. Falls asleep during conversations. Doesn't use the CPAP but 4 hours. Takes it off and goes into the living room and sleep.   Memory: [name] feels that his memory is worse. Continues on Namenda 10 mg twice a day.  Able to complete ADLs independently but [name] helps with dressing.  No longer drives.  He is [name] reports that he can walk into her room and will forget why he is there    09/13/22: [name] is a 82 year old male with a history of obstructive sleep apnea on CPAP and memory disturbance.  He returns today for follow-up.  The patient states that CPAP works well for him.  He does notice the mask leaking and has not changed out recently.  His [name] reports that he sleeps all times of the day.  He does not have a regular sleep routine.  Some nights he will not go to bed till 3 AM there is other nights that he goes to bed then wakes up at 2 AM and stays up for several hours.  Is using modafinil during the day but not sure that is beneficial.  She reports that the pharmacist told her to increase it to 200 however she has not done that.  Feels that memory is worse according to his [name]. [name] reports that he gets distracted easily. He will take trash out but then will  get distracted and start cleaning out the garage. Sometimes does not remember what he [name] has told him. Able to do all ADLS independently. Does not drive typically. Appetite has decreased. Has been drinking ensure. Has continued on namenda 10 mg BID. [name] reports that he sleeps a lot during the day.     03/08/22: [name] is a 82 year old male with a history of obstructive sleep apnea on CPAP.  He returns today for follow-up.  He still struggles with daytime sleepiness.  He reports that he did try modafinil 50 mg daily.  Unsure of the benefit but reports insurance will no longer cover this.  In the past he was seeing Dr. Anne Hahn for some memory issues.  His [name] reports that she has noticed some issues with his memory.  He is able to complete all ADLs independently.  He no longer drives.  He is on Namenda.     03/08/21: [name] is a 82 year old male with a history of obstructive sleep apnea on CPAP and excessive daytime sleepiness.  He returns today for follow-up.  He reports that there are some nights that he does not sleep greater than 4 hours.  He states that he also sleeps a lot during the day.  He does  not think he ever got the prescription for modafinil.  He will check at home and ensure that he is not taking his medicine.  [name] reports that they can be driving and he will fall asleep in the car.  He does not operate a motor vehicle.  She states that she can have a conversation with him and he falls asleep during the conversation.    09/08/20: [name] is a 82 year old male with a history of obstructive sleep apnea on CPAP.  His download indicates that he uses machine 21 out of 30 days for compliance of 70%.  He uses machine greater than 4 hours 19 days for compliance of 63%.  On average he uses his machine 6 hours and 33 minutes.  His residual AHI is 3.6 on 6 to 16 cm of water with EPR of 3.  Leak in the 95th percentile is 43.2 L/min.  He reports that he just was able to get modafinil last  night.  He has not taken a dose yet.  HISTORY Patient CPAP download indicates that he did use his machine nightly for compliance of 100%. He uses machine greater than 4 hours 16 days for compliance of 53%. On average he uses his machine 4 hours and 35 minutes. His residual AHI is 3.7 on 6 to 16 cm of water with EPR of 3. Leak in the 95th percentile is 29.6 L/min.   The patient and his [name] state that he continues to nap throughout the day. [name] reports that he typically goes back to bed around 2 PM and will not get up to 5 PM. Reports that he has several naps throughout the day. Patient reports that he would like to be more awake during the day. He is interested in trying medication  REVIEW OF SYSTEMS: Out of a complete 14 system review of symptoms, the patient complains only of the following symptoms, and all other reviewed systems are negative.  See HPI  ALLERGIES: Allergies  Allergen Reactions   Aricept [Donepezil] Other (See Comments)    Hallucination    Baclofen Itching   Lipitor [Atorvastatin] Other (See Comments)    Stiff joints   Nsaids Other (See Comments)    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   Warfarin And Related Other (See Comments)    Stiff joints   Enbrel [Etanercept] Itching    HOME MEDICATIONS: Outpatient Medications Prior to Visit  Medication Sig Dispense Refill   allopurinol (ZYLOPRIM) 300 MG tablet Take 1 tablet (300 mg total) by mouth daily. 30 tablet 0   cephALEXin (KEFLEX) 500 MG capsule Take 1 capsule (500 mg total) by mouth 3 (three) times daily. 15 capsule 0   colchicine 0.6 MG tablet TAKE 1 TO 3 TABLETS AS NEEDED DAILY     digoxin (LANOXIN) 0.125 MG tablet TAKE ONE TABLET BY MOUTH MONDAY THROUGH FRIDAY. DO NOT TAKE ON SATURDAY OR SUNDAY. 90 tablet 3   doxycycline (VIBRA-TABS) 100 MG tablet Take 1 tablet (100 mg total) by mouth 2 (two) times daily. 14 tablet 0   ezetimibe (ZETIA) 10 MG tablet Take 1 tablet (10 mg total) by mouth daily. 90 tablet 3   ferrous  sulfate 325 (65 FE) MG tablet Take 1 tablet (325 mg total) by mouth daily with breakfast. 30 tablet 3   memantine (NAMENDA) 10 MG tablet Take 10 mg by mouth 2 (two) times daily.     metoprolol succinate (TOPROL-XL) 25 MG 24 hr tablet Take 0.5 tablets by mouth daily.  mirabegron ER (MYRBETRIQ) 50 MG TB24 tablet Take 1 tablet (50 mg total) by mouth daily. 30 tablet 3   modafinil (PROVIGIL) 100 MG tablet TAKE 1 TABLET BY MOUTH EVERY DAY 30 tablet 5   Multiple Vitamin (MULTIVITAMIN WITH MINERALS) TABS tablet Take 1 tablet by mouth daily.     pantoprazole (PROTONIX) 40 MG tablet Take 1 tablet (40 mg total) by mouth daily. 30 tablet 0   rosuvastatin (CRESTOR) 40 MG tablet Take 1 tablet (40 mg total) by mouth daily. 90 tablet 2   sertraline (ZOLOFT) 100 MG tablet Take 2 tablets (200 mg total) by mouth daily. (Patient taking differently: Take 150 mg by mouth daily. 1.5 tablets daily) 30 tablet 0   silver sulfADIAZINE (SILVADENE) 1 % cream Apply 1 Application topically daily. 50 g 0   tamsulosin (FLOMAX) 0.4 MG CAPS capsule Take 1 capsule (0.4 mg total) by mouth at bedtime. 30 capsule 1   Teriparatide, Recombinant, 620 MCG/2.48ML SOPN inject 20 mcg Subcutaneous Once a day (Patient taking differently: Takes injection twice a year.) 1 mL 11   torsemide (DEMADEX) 20 MG tablet Take 4 tablets (80 mg total) by mouth 2 (two) times daily. 160 mg BID X1WEEK THEN BACK TO 80MG  DAILY (Patient taking differently: Take 30 mg by mouth 2 (two) times daily. 02/14/22 states pcp told to take 1 1/2 tablets daily) 45 tablet 6   No facility-administered medications prior to visit.    PAST MEDICAL HISTORY: Past Medical History:  Diagnosis Date   (HFpEF) heart failure with preserved ejection fraction    a. 05/2013 Echo: EF 55%, mild LVH, diast dysfxn, Ao sclerosis, mildly dil LA, RV dysfxn (poorly visualized), PASP ;  b. 03/2017 Echo: EF 55-60%, no rwma, triv MR, mildly dil RV, mod TR, PASP .   Atrial fibrillation     s/p Cox Maze 1/09;  Multaq Rx d/c'd in 2014 due to pulmo fibrosis;  coumadin d/c'd in 2014 due to spontaneous subdural hematoma   BPH (benign prostatic hyperplasia)    CAD (coronary artery disease), native coronary artery    a. s/p CABG 12/2007;  b. Myoview 12/2011: EF 66%, no scar or ischemia; normal.   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 Jude   Peripheral neuropathy 07/31/2018   Pulmonary fibrosis    Multaq d/c'd 7/14   Subdural hematoma 07/2012   spontaneous;  coumadin d/c'd => no longer a candidate for anticoagulation    PAST SURGICAL HISTORY: Past Surgical History:  Procedure Laterality Date   AMPUTATION Left 05/17/2019   Procedure: AMPUTATION LEFT FOURTH TOE;  Surgeon: Cammy Copa, MD;  Location: [facility] OR;  Service: Orthopedics;  Laterality: Left;   AMPUTATION TOE Right 02/06/2020   Procedure: AMPUTATION TOE;  Surgeon: Nadara Mustard, MD;  Location: [facility] OR;  Service: Orthopedics;  Laterality: Right;   AMPUTATION TOE Right 08/17/2020   Procedure: AMPUTATION TOE 4th toe;  Surgeon: [name], DPM;  Location: [facility] ORS;  Service: Podiatry;  Laterality: Right;   APPENDECTOMY     CHOLECYSTECTOMY     CORONARY ARTERY BYPASS GRAFT     x3 (left internal mammary artery to distal left anterior descending coronary artery, saphenous vain graft to second circumflex marginal branch, saphenous vain graft to posterior descending coronary artery, endoscopic saphenous vain harvest from  right thigh) and modified Cox - Maze IV procedure.  [name]. [name],MD. Electronically signed CHO/MEDQ D: 01/09/2008/ JOB: [id number] cc:  Antionette Char MD   CRANIOTOMY  07/30/2012   Procedure: CRANIOTOMY HEMATOMA EVACUATION SUBDURAL;  Surgeon: Mariam Dollar, MD;  Location: [facility]UR[facility] OR[facility];  Service: Neurosurgery;  Laterality: Right;  Right craniotomy for  evacuation of subdural hematoma   FOOT SURGERY     HERNIA REPAIR     INTRAMEDULLARY (IM) NAIL INTERTROCHANTERIC Right 02/04/2020   Procedure: INTRAMEDULLARY (IM) NAIL INTERTROCHANTRIC;  Surgeon: Beverely Low, MD;  Location: [facility] OR;  Service: Orthopedics;  Laterality: Right;   ORCHIECTOMY     Left  /  testicular cancer   PACEMAKER PLACEMENT     PPM - St. Jude   PPM GENERATOR CHANGEOUT N/A 06/25/2019   Procedure: PPM GENERATOR CHANGEOUT;  Surgeon: Marinus Maw, MD;  Location: [facility];  Service: Cardiovascular;  Laterality: N/A;    FAMILY HISTORY: Family History  Problem Relation Age of Onset   Heart disease Father    Heart attack Father    Heart failure Father    Heart disease Mother    Alzheimer's disease Mother    Dementia Mother     SOCIAL HISTORY: Social History   Socioeconomic History   Marital status: Married    Spouse name: Engineer, maintenance (IT)   Number of children: 2   Years of education: Not on file   Highest education level: Not on file  Occupational History   Occupation: Retired- Theatre stage manager  Tobacco Use   Smoking status: Former    Packs/day: 2.00    Years: 20.00    Additional pack years: 0.00    Total pack years: 40.00    Types: Cigarettes    Quit date: 02/21/1991    Years since quitting: 32.1   Smokeless tobacco: Never  Vaping Use   Vaping Use: Never used  Substance and Sexual Activity   Alcohol use: No    Alcohol/week: 0.0 standard drinks of alcohol   Drug use: No   Sexual activity: Not Currently  Other Topics Concern   Not on file  Social History Narrative   Lives with [name]   Right handed    Married with two children.     He is a Emergency planning/management officer.     Social Determinants of Health   Financial Resource Strain: Not on file  Food Insecurity: Not on file  Transportation Needs: Not on file  Physical Activity: Not on file  Stress: Not on file  Social Connections: Not on file  Intimate Partner Violence: Not on file      PHYSICAL EXAM  Vitals:    04/03/23 1118  BP: 115/62  Pulse: (!) 59  Weight: 196 lb 9.6 oz (89.2 kg)  Height: 5' 8.6" (1.742 m)    Body mass index is 29.37 kg/m.     04/03/2023   11:45 AM 09/13/2022   11:02 AM 03/08/2022   11:49 AM  MMSE - Mini Mental State Exam  Orientation to time 3 4 5   Orientation to Place 5 5 5   Registration 3 3 3   Attention/ Calculation 2 1 5   Attention/Calculation-comments   WORLD  Recall 2 2 1   Language- name 2 objects 2 2 2   Language- repeat 1 1 1   Language- follow 3 step command 3 3 3   Language- read & follow direction 1 1 1   Write a sentence 1 1 1   Copy design 0 1 1  Total score  23 24 28      Generalized: Well developed, in no acute distress  Chest: Lungs clear to auscultation bilaterally  Neurological examination  Mentation: Alert oriented to time, place, history taking. Follows all commands speech and language fluent Cranial nerve II-XII: Extraocular movements were full, visual field were full on confrontational test Head turning and shoulder shrug  were normal and symmetric. Motor: The motor testing reveals 5 over 5 strength of all 4 extremities. Good symmetric motor tone is noted throughout.  Sensory: Sensory testing is intact to soft touch on all 4 extremities. No evidence of extinction is noted.  Gait and station: Uses a cane when ambulating  DIAGNOSTIC DATA (LABS, IMAGING, TESTING) - I reviewed patient records, labs, notes, testing and imaging myself where available.  Lab Results  Component Value Date   WBC 4.3 03/20/2023   HGB 11.4 (L) 03/20/2023   HCT 33.1 (L) 03/20/2023   MCV 95.3 03/20/2023   PLT 106.0 (L) 03/20/2023      Component Value Date/Time   NA 145 (H) 03/29/2023 1203   NA 141 02/23/2017 1237   K 3.6 03/29/2023 1203   K 3.8 02/23/2017 1237   CL 101 03/29/2023 1203   CO2 28 03/29/2023 1203   CO2 27 02/23/2017 1237   GLUCOSE 101 (H) 03/29/2023 1203   GLUCOSE 94 03/20/2023 1433   GLUCOSE 113 02/23/2017 1237   BUN 31 (H) 03/29/2023 1203    BUN 18.3 02/23/2017 1237   CREATININE 1.55 (H) 03/29/2023 1203   CREATININE 1.2 02/23/2017 1237   CALCIUM 9.4 03/29/2023 1203   CALCIUM 9.6 02/23/2017 1237   PROT 7.8 03/29/2023 1203   PROT 8.0 02/23/2017 1237   ALBUMIN 4.0 03/29/2023 1203   ALBUMIN 4.1 02/23/2017 1237   AST 34 03/29/2023 1203   AST 24 02/23/2017 1237   ALT 19 03/29/2023 1203   ALT 15 02/23/2017 1237   ALKPHOS 93 03/29/2023 1203   ALKPHOS 105 02/23/2017 1237   BILITOT 0.6 03/29/2023 1203   BILITOT 0.98 02/23/2017 1237   GFRNONAA 51 (L) 05/10/2022 0917   GFRAA 72 02/01/2021 1410   Lab Results  Component Value Date   CHOL 97 (L) 03/29/2023   HDL 47 03/29/2023   LDLCALC 36 03/29/2023   LDLDIRECT 154.2 10/20/2013   TRIG 65 03/29/2023   CHOLHDL 2.1 03/29/2023   Lab Results  Component Value Date   HGBA1C 5.6 05/21/2019   Lab Results  Component Value Date   VITAMINB12 481 11/07/2019   Lab Results  Component Value Date   TSH 2.022 05/16/2019      ASSESSMENT AND PLAN 82 y.o. year old male  has a past medical history of (HFpEF) heart failure with preserved ejection fraction, Atrial fibrillation, BPH (benign prostatic hyperplasia), CAD (coronary artery disease), native coronary artery, DM (diabetes mellitus), Hyperlipidemia type II, Hypertension, [name] (monoclonal gammopathy of unknown significance) (07/31/2018), OSA (obstructive sleep apnea), Pacemaker, Peripheral neuropathy (07/31/2018), Pulmonary fibrosis, and Subdural hematoma (07/2012). here with:  Obstructive sleep apnea on CPAP  Download indicates good compliance but not greater than 4 hours  Good treatment of apnea when using  Encourage patient continue using CPAP nightly and greater than 4 hours each night Continue to try to improve sleep hygiene  Idiopathic hypersomnia  Continue modafinil 100 mg daily as needed.  Memory Disturbance  MMSE 23/30 previously 24/30 Score has slightly declined Continue Namenda 10 mg BID   FU in 6 months or  sooner if needed with Dr. Vickey Huger  Butch Penny, MSN, NP-C  04/03/2023, 10:52 AM [facility] [address] [place], [place] [address] [phone]

## 2023-04-03 ENCOUNTER — Encounter: Payer: Self-pay | Admitting: Adult Health

## 2023-04-03 ENCOUNTER — Ambulatory Visit (INDEPENDENT_AMBULATORY_CARE_PROVIDER_SITE_OTHER): Payer: Medicare Other | Admitting: Adult Health

## 2023-04-03 VITALS — BP 115/62 | HR 59 | Ht 68.6 in | Wt 196.6 lb

## 2023-04-03 DIAGNOSIS — G4711 Idiopathic hypersomnia with long sleep time: Secondary | ICD-10-CM | POA: Diagnosis not present

## 2023-04-03 DIAGNOSIS — R413 Other amnesia: Secondary | ICD-10-CM | POA: Diagnosis not present

## 2023-04-03 DIAGNOSIS — G4733 Obstructive sleep apnea (adult) (pediatric): Secondary | ICD-10-CM

## 2023-04-04 ENCOUNTER — Ambulatory Visit (INDEPENDENT_AMBULATORY_CARE_PROVIDER_SITE_OTHER): Payer: Medicare Other | Admitting: Internal Medicine

## 2023-04-04 DIAGNOSIS — J8410 Pulmonary fibrosis, unspecified: Secondary | ICD-10-CM

## 2023-04-04 LAB — PULMONARY FUNCTION TEST
DL/VA % pred: 70 %
DL/VA: 2.76 ml/min/mmHg/L
DLCO cor % pred: 43 %
DLCO cor: 9.68 ml/min/mmHg
DLCO unc % pred: 39 %
DLCO unc: 8.67 ml/min/mmHg
FEF 25-75 Post: 3.16 L/sec
FEF 25-75 Pre: 2.63 L/sec
FEF2575-%Change-Post: 19 %
FEF2575-%Pred-Post: 185 %
FEF2575-%Pred-Pre: 154 %
FEV1-%Change-Post: 2 %
FEV1-%Pred-Post: 86 %
FEV1-%Pred-Pre: 84 %
FEV1-Post: 2.15 L
FEV1-Pre: 2.1 L
FEV1FVC-%Change-Post: 3 %
FEV1FVC-%Pred-Pre: 120 %
FEV6-%Change-Post: -1 %
FEV6-%Pred-Post: 73 %
FEV6-%Pred-Pre: 74 %
FEV6-Post: 2.42 L
FEV6-Pre: 2.44 L
FEV6FVC-%Pred-Post: 107 %
FEV6FVC-%Pred-Pre: 107 %
FVC-%Change-Post: -1 %
FVC-%Pred-Post: 68 %
FVC-%Pred-Pre: 69 %
FVC-Post: 2.42 L
FVC-Pre: 2.44 L
Post FEV1/FVC ratio: 89 %
Post FEV6/FVC ratio: 100 %
Pre FEV1/FVC ratio: 86 %
Pre FEV6/FVC Ratio: 100 %
RV % pred: 70 %
RV: 1.77 L
TLC % pred: 64 %
TLC: 4.14 L

## 2023-04-04 NOTE — Progress Notes (Signed)
Full PFT performed today. °

## 2023-04-04 NOTE — Patient Instructions (Signed)
Full PFT performed today. °

## 2023-04-04 NOTE — Progress Notes (Unsigned)
Cardiology Clinic Note   Patient Name: [name]. Date of Encounter: 04/05/2023  Primary Care Provider:  Merri Brunette, MD Primary Cardiologist:  Olga Millers, MD  Patient Profile    [name]. 82 year old male presents to the clinic today for follow-up evaluation of his paroxysmal atrial fibrillation and chronic right-sided heart failure.   Past Medical History    Past Medical History:  Diagnosis Date   (HFpEF) heart failure with preserved ejection fraction    a. 05/2013 Echo: EF 55%, mild LVH, diast dysfxn, Ao sclerosis, mildly dil LA, RV dysfxn (poorly visualized), PASP ;  b. 03/2017 Echo: EF 55-60%, no rwma, triv MR, mildly dil RV, mod TR, PASP .   Atrial fibrillation    s/p Cox Maze 1/09;  Multaq Rx d/c'd in 2014 due to pulmo fibrosis;  coumadin d/c'd in 2014 due to spontaneous subdural hematoma   BPH (benign prostatic hyperplasia)    CAD (coronary artery disease), native coronary artery    a. s/p CABG 12/2007;  b. Myoview 12/2011: EF 66%, no scar or ischemia; normal.   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 Jude   Peripheral neuropathy 07/31/2018   Pulmonary fibrosis    Multaq d/c'd 7/14   Subdural hematoma 07/2012   spontaneous;  coumadin d/c'd => no longer a candidate for anticoagulation   Past Surgical History:  Procedure Laterality Date   AMPUTATION Left 05/17/2019   Procedure: AMPUTATION LEFT FOURTH TOE;  Surgeon: Cammy Copa, MD;  Location: [facility] OR;  Service: Orthopedics;  Laterality: Left;   AMPUTATION TOE Right 02/06/2020   Procedure: AMPUTATION TOE;  Surgeon: Nadara Mustard, MD;  Location: [facility] OR;  Service: Orthopedics;  Laterality: Right;   AMPUTATION TOE Right 08/17/2020   Procedure: AMPUTATION TOE 4th toe;  Surgeon: [name], DPM;  Location: [facility] ORS;  Service:  Podiatry;  Laterality: Right;   APPENDECTOMY     CHOLECYSTECTOMY     CORONARY ARTERY BYPASS GRAFT     x3 (left internal mammary artery to distal left anterior descending coronary artery, saphenous vain graft to second circumflex marginal branch, saphenous vain graft to posterior descending coronary artery, endoscopic saphenous vain harvest from right thigh) and modified Cox - Maze IV procedure.  [name]. [name],MD. Electronically signed CHO/MEDQ D: 01/09/2008/ JOB: [id number] cc:  Antionette Char MD   CRANIOTOMY  07/30/2012   Procedure: CRANIOTOMY HEMATOMA EVACUATION SUBDURAL;  Surgeon: Mariam Dollar, MD;  Location: [facility] NEURO ORS;  Service: Neurosurgery;  Laterality: Right;  Right craniotomy for evacuation of subdural hematoma   FOOT SURGERY     HERNIA REPAIR     INTRAMEDULLARY (IM) NAIL INTERTROCHANTERIC Right 02/04/2020   Procedure: INTRAMEDULLARY (IM) NAIL INTERTROCHANTRIC;  Surgeon: Beverely Low, MD;  Location: [facility] OR;  Service: Orthopedics;  Laterality: Right;   ORCHIECTOMY     Left  /  testicular cancer   PACEMAKER PLACEMENT     PPM - St. Jude   PPM GENERATOR CHANGEOUT N/A 06/25/2019   Procedure: PPM GENERATOR CHANGEOUT;  Surgeon: Marinus Maw, MD;  Location: [facility] INVASIVE CV LAB;  Service: Cardiovascular;  Laterality: N/A;    Allergies  Allergies  Allergen Reactions   Aricept [Donepezil] Other (See Comments)    Hallucination    Baclofen Itching  Lipitor [Atorvastatin] Other (See Comments)    Stiff joints   Nsaids Other (See Comments)    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   Warfarin And Related Other (See Comments)    Stiff joints   Enbrel [Etanercept] Itching    History of Present Illness    Hardeep G Aloi Jr. has a PMH of coronary artery disease and atrial fibrillation.  Coronary artery bypass graft LIMA-LAD, SVG-L2, SVG-PDA as well as modified Cox-Maze procedure 1/09.  Patient) patient presents today to symptomatic bradycardia.  He underwent abdominal ultrasound on 3/12 which showed no  aneurysm.  He suffered spontaneous subdural hematoma/13.  He has chronic cognitive and evacuation by Dr. Wynetta Emery. He was taken off his Coumadin and no longer felt to be a appropriate candidate.  His Multaq was DC'd previously due to pulmonary toxicity.  His nuclear stress test 5/18 showed an EF of 59% with normal perfusion.  Echocardiogram 5/20 showed EF of 50-55%, moderate ventricular enlargement, mildly reduced RV function, severe tricuspid regurgitation.  His ABIs 6/20 was normal.  His carotid Doppler 6/20 showed 1-39% bilateral stenosis.  He had traits cranial Doppler 6/20 which were negative.  His chest CT 8/22 showed pulmonary fibrosis, probable cirrhosis, enlarged 2010 enlarged pulmonary trunk suggestive of pulmonary hypertension.  He was seen August 2022 with recurrent atrial fibrillation by Dr. Ladona Ridgel.  His metoprolol was increased at that time.  He was seen by Dr. Jens Som on 10/11/2021.  During that time he was noted to have worsening bilateral lower extremity swelling.  He did have some increased dyspnea on exertion.  He also noted fatigue.  His Demadex was increased as an outpatient as he noted some improvement but his symptoms persisted.  He denied chest pain, palpitations, and syncope.     He contacted the nurse triage line on 11/02/21.  With complaints of having increased lower extremity swelling.  Patient diuresis was decreased.  His BNP remains stable.   He followed up with Dr. Ladona Ridgel on 2222.  During that time he was noted to be NYHA class III.  His device was functioning normally.   He presented to the clinic 12/22 for follow-up evaluation stated he had not lost any weight and his legs were still swollen.  He presented with his wife.  She reported that she was unable to get lower extremity support stockings on his lower extremities.   He had noticed some accelerated heart rates and felt that his urination increased and  plateaued.  We discussed the importance of low-salt diet, fluid  restriction, daily weights, and contacting the office with rapid weight increases.  I  continued his torsemide at the increased dose of 160 mg in the a.m. and 160 mg at noon.  I started Zaroxolyn Wednesday and Friday.  I asked him to reduce to 48 ounce of fluid daily.    He was seen by Dr. Ladona Ridgel 8/23.  His digoxin was increased and his rate control improved.  His ABIs 11/23 were normal.  He followed up with Dr. Jens Som 02/26/2023.  During that time he noted some increased dyspnea on exertion.  He denied orthopnea and PND.  He had minimal lower extremity pedal edema.  He denied exertional chest pain and syncope.  His BMP was stable.  Echocardiogram 03/13/2023 showed an LVEF of 55-60%, intermediate diastolic parameters mildly dilated left atria and moderately dilated right atria with trivial mitral valve regurgitation and severe tricuspid valve regurgitation.  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  Echocardiogram ordered by pulmonology.  Concern for right heart pressures and recommended right heart cath.    He presents to the clinic today for follow-up evaluation and states he feels well.  He presents with his wife who is very detailed with her recordkeeping.  She reports that he saw Dr. Renne Crigler on the fifth, eighth, and 15th.  He was monitored very closely and had significant weight decreased with diuresis.  He also was on 2 rounds of antibiotics for concern for lower extremity cellulitis.  Today he has returned to near his dry weight.  His blood pressure is well-controlled.  And he just has general lower extremity nonpitting edema.  We reviewed the importance of fluid restriction, low-salt diet, and maintain physical activity.  I will give a weight log and schedule follow-up as previously planned.  He will undergo right heart cath in the near future.   Today he denies chest pain, increased shortness of breath,  fatigue, palpitations, melena, hematuria, hemoptysis, diaphoresis,  weakness, presyncope, syncope, orthopnea, and PND.      Home Medications    Prior to Admission medications   Medication Sig Start Date End Date Taking? Authorizing Provider  allopurinol (ZYLOPRIM) 300 MG tablet Take 1 tablet (300 mg total) by mouth daily. 02/24/20   Angiulli, Mcarthur Rossetti, PA-C  cephALEXin (KEFLEX) 500 MG capsule Take 1 capsule (500 mg total) by mouth 3 (three) times daily. 03/20/23   Kalman Shan, MD  colchicine 0.6 MG tablet TAKE 1 TO 3 TABLETS AS NEEDED DAILY    [provider]  digoxin (LANOXIN) 0.125 MG tablet TAKE ONE TABLET BY MOUTH MONDAY THROUGH FRIDAY. DO NOT TAKE ON SATURDAY OR SUNDAY. 01/08/23   Marinus Maw, MD  doxycycline (VIBRA-TABS) 100 MG tablet Take 1 tablet (100 mg total) by mouth 2 (two) times daily. 11/21/22   [name], DPM  ezetimibe (ZETIA) 10 MG tablet Take 1 tablet (10 mg total) by mouth daily. 05/16/22   Lewayne Bunting, MD  ferrous sulfate 325 (65 FE) MG tablet Take 1 tablet (325 mg total) by mouth daily with breakfast. 02/24/20   Angiulli, Mcarthur Rossetti, PA-C  memantine (NAMENDA) 10 MG tablet Take 10 mg by mouth 2 (two) times daily. 08/29/20   [provider]  metoprolol succinate (TOPROL-XL) 25 MG 24 hr tablet Take 0.5 tablets by mouth daily.    [provider]  mirabegron ER (MYRBETRIQ) 50 MG TB24 tablet Take 1 tablet (50 mg total) by mouth daily. 02/24/20   Angiulli, Mcarthur Rossetti, PA-C  modafinil (PROVIGIL) 100 MG tablet TAKE 1 TABLET BY MOUTH EVERY DAY 09/28/22   Butch Penny, NP  Multiple Vitamin (MULTIVITAMIN WITH MINERALS) TABS tablet Take 1 tablet by mouth daily. 02/10/20   Alessandra Bevels, MD  pantoprazole (PROTONIX) 40 MG tablet Take 1 tablet (40 mg total) by mouth daily. 02/24/20   Angiulli, Mcarthur Rossetti, PA-C  rosuvastatin (CRESTOR) 40 MG tablet Take 1 tablet (40 mg total) by mouth daily. 02/24/20   Angiulli, Mcarthur Rossetti, PA-C  sertraline (ZOLOFT) 100 MG tablet Take 2 tablets (200 mg total) by mouth daily. Patient taking  differently: Take 100 mg by mouth daily. 1.5 tablets daily 02/24/20   Angiulli, Mcarthur Rossetti, PA-C  spironolactone (ALDACTONE) 50 MG tablet Take 50 mg by mouth daily.    [provider]  tamsulosin (FLOMAX) 0.4 MG CAPS capsule Take 1 capsule (0.4 mg total) by mouth at bedtime. 02/24/20   Angiulli, Mcarthur Rossetti, PA-C  Teriparatide, Recombinant, [phone]  MCG/2.48ML SOPN inject 20 mcg Subcutaneous Once a day Patient taking differently: Takes injection twice a year. 04/11/21     torsemide (DEMADEX) 20 MG tablet Take 4 tablets (80 mg total) by mouth 2 (two) times daily. 160 mg BID X1WEEK THEN BACK TO  DAILY Patient taking differently: Take 30 mg by mouth 2 (two) times daily. 02/14/22 states pcp told to take 1 1/2 tablets daily 11/22/21   Ronney Asters, NP    Family History    Family History  Problem Relation Age of Onset   Heart disease Father    Heart attack Father    Heart failure Father    Heart disease Mother    Alzheimer's disease Mother    Dementia Mother    He indicated that his mother is deceased. He indicated that his father is deceased. He indicated that his sister is alive. He indicated that his maternal grandmother is deceased. He indicated that his maternal grandfather is deceased. He indicated that his paternal grandmother is deceased. He indicated that his paternal grandfather is deceased.  Social History    Social History   Socioeconomic History   Marital status: Married    Spouse name: Engineer, maintenance (IT)   Number of children: 2   Years of education: Not on file   Highest education level: Not on file  Occupational History   Occupation: Retired- Theatre stage manager  Tobacco Use   Smoking status: Former    Packs/day: 2.00    Years: 20.00    Additional pack years: 0.00    Total pack years: 40.00    Types: Cigarettes    Quit date: 02/21/1991    Years since quitting: 32.1   Smokeless tobacco: Never  Vaping Use   Vaping Use: Never used  Substance and Sexual Activity   Alcohol use: No     Alcohol/week: 0.0 standard drinks of alcohol   Drug use: No   Sexual activity: Not Currently  Other Topics Concern   Not on file  Social History Narrative   Lives with wife   Right handed    Married with two children.     He is a Emergency planning/management officer.     Social Determinants of Health   Financial Resource Strain: Not on file  Food Insecurity: Not on file  Transportation Needs: Not on file  Physical Activity: Not on file  Stress: Not on file  Social Connections: Not on file  Intimate Partner Violence: Not on file     Review of Systems    General:  No chills, fever, night sweats or weight changes.  Cardiovascular:  No chest pain, dyspnea on exertion, edema, orthopnea, palpitations, paroxysmal nocturnal dyspnea. Dermatological: No rash, lesions/masses Respiratory: No cough, dyspnea Urologic: No hematuria, dysuria Abdominal:   No nausea, vomiting, diarrhea, bright red blood per rectum, melena, or hematemesis Neurologic:  No visual changes, wkns, changes in mental status. All other systems reviewed and are otherwise negative except as noted above.  Physical Exam    VS:  BP 126/64 (BP Location: Left Arm, Patient Position: Sitting)   Pulse 89   Ht  (1.727 m)   Wt 198 lb (89.8 kg)   SpO2 98%   BMI 30.11 kg/m  , BMI Body mass index is 30.11 kg/m. GEN: Well nourished, well developed, in no acute distress. HEENT: normal. Neck: Supple, no JVD, carotid bruits, or masses. Cardiac: RRR, 2/6 systolic murmur heard along left sternal border , rubs, or gallops. No clubbing, cyanosis, edema.  Radials/DP/PT 2+ and  equal bilaterally.  Respiratory:  Respirations regular and unlabored, clear to auscultation bilaterally. GI: Soft, nontender, nondistended, BS + x 4. MS: no deformity or atrophy. Skin: warm and dry, no rash. Neuro:  Strength and sensation are intact. Psych: Normal affect.  Accessory Clinical Findings    Recent Labs: 03/20/2023: Hemoglobin 11.4; Platelets 106.0 03/29/2023:  ALT 19; BUN 31; Creatinine, Ser 1.55; NT-Pro BNP 1,955; Potassium 3.6; Sodium 145   Recent Lipid Panel    Component Value Date/Time   CHOL 97 (L) 03/29/2023 1203   TRIG 65 03/29/2023 1203   HDL 47 03/29/2023 1203   CHOLHDL 2.1 03/29/2023 1203   CHOLHDL 2.6 05/22/2019 0338   VLDL 12 05/22/2019 0338   LDLCALC 36 03/29/2023 1203   LDLDIRECT 154.2 10/20/2013 0846         ECG personally reviewed by me today-none today.  Echocardiogram on 11/02/2021 IMPRESSIONS     1. Left ventricular ejection fraction, by estimation, is 55 to 60%. The  left ventricle has normal function. The left ventricle has no regional  wall motion abnormalities. Left ventricular diastolic function could not  be evaluated. There is the  interventricular septum is flattened in diastole ('D' shaped left  ventricle), consistent with right ventricular volume overload.   2. Right ventricular systolic function is moderately reduced. The right  ventricular size is moderately enlarged. There is moderately elevated  pulmonary artery systolic pressure. The estimated right ventricular  systolic pressure is 47.7 mmHg.   3. Left atrial size was mildly dilated.   4. Right atrial size was moderately dilated.   5. The mitral valve is normal in structure. No evidence of mitral valve  regurgitation.   6. The tricuspid valve is abnormal. Tricuspid valve regurgitation is  severe.   7. The aortic valve is normal in structure. Aortic valve regurgitation is  not visualized.   8. The inferior vena cava is dilated in size with <50% respiratory  variability, suggesting right atrial pressure of 15 mmHg.   Comparison(s): Prior images reviewed side by side. Findings are similar,  but tricuspid insufficiency appears even more severe.  Echocardiogram 03/13/2023  IMPRESSIONS     1. Left ventricular ejection fraction, by estimation, is 55 to 60%. The  left ventricle has normal function. The left ventricle has no regional  wall  motion abnormalities. Left ventricular diastolic parameters are  indeterminate. There is the  interventricular septum is flattened in diastole ('D' shaped left  ventricle), consistent with right ventricular volume overload.   2. Right ventricular systolic function is moderately reduced. The right  ventricular size is moderately enlarged. PASP likely underestimated due to  severity of TR.   3. Left atrial size was mildly dilated.   4. Right atrial size was moderately dilated.   5. The mitral valve is grossly normal. Trivial mitral valve  regurgitation.   6. Tricuspid valve regurgitation is severe.   7. The aortic valve is tricuspid. Aortic valve regurgitation is not  visualized. Aortic valve sclerosis/calcification is present, without any  evidence of aortic stenosis.   8. Aortic dilatation noted. There is borderline dilatation of the  ascending aorta, measuring 38 mm.   9. The inferior vena cava is dilated in size with <50% respiratory  variability, suggesting right atrial pressure of 15 mmHg.   Comparison(s): No significant change from prior TTE. There continues to be  severe TR with moderate RV dysfunction and enlargement. PASP likely  underestimated in the setting of severe TR.   FINDINGS   Left Ventricle:  Left ventricular ejection fraction, by estimation, is 55  to 60%. The left ventricle has normal function. The left ventricle has no  regional wall motion abnormalities. Definity contrast agent was given IV  to delineate the left ventricular   endocardial borders. The left ventricular internal cavity size was normal  in size. There is no left ventricular hypertrophy. The interventricular  septum is flattened in diastole ('D' shaped left ventricle), consistent  with right ventricular volume  overload. Left ventricular diastolic parameters are indeterminate.   Right Ventricle: The right ventricular size is moderately enlarged. No  increase in right ventricular wall thickness.  Right ventricular systolic  function is moderately reduced. There is moderately elevated pulmonary  artery systolic pressure. The tricuspid   regurgitant velocity is 2.80 m/s, and with an assumed right atrial  pressure of 15 mmHg, the estimated right ventricular systolic pressure is  46.4 mmHg.   Left Atrium: Left atrial size was mildly dilated.   Right Atrium: Right atrial size was moderately dilated.   Pericardium: There is no evidence of pericardial effusion.   Mitral Valve: The mitral valve is grossly normal. Trivial mitral valve  regurgitation.   Tricuspid Valve: The tricuspid valve is normal in structure. Tricuspid  valve regurgitation is severe. The flow in the hepatic veins is reversed  during ventricular systole.   Aortic Valve: The aortic valve is tricuspid. Aortic valve regurgitation is  not visualized. Aortic valve sclerosis/calcification is present, without  any evidence of aortic stenosis.   Pulmonic Valve: The pulmonic valve was normal in structure. Pulmonic valve  regurgitation is trivial.   Aorta: Aortic dilatation noted. There is borderline dilatation of the  ascending aorta, measuring 38 mm.   Venous: The inferior vena cava is dilated in size with less than 50%  respiratory variability, suggesting right atrial pressure of 15 mmHg.   IAS/Shunts: The atrial septum is grossly normal.   Assessment & Plan   1.  Acute on chronic diastolic CHF and right-sided CHF-generalized bilateral lower extremity nonpitting edema.   Felt to be secondary to hypotension respiratory fibrosis and sleep apnea. Weight today 198 pounds.  Echocardiogram showed no significant changes from prior study.  Details above. Continue Demadex, spironolactone, metoprolol, digoxin Heart healthy low-sodium diet Increase physical activity as tolerated Daily weights, weight log  lower extremity support stockings Elevate lower extremities when not active-above chest-reviewed Continue fluid  restriction    Persistent atrial fibrillation-heart rate today 89 bpm.  Not a candidate for anticoagulation due to spontaneous SDH Continue metoprolol, digoxin Avoid triggers caffeine, chocolate, EtOH, dehydration etc. Follows with EP  Coronary artery disease-no recent episodes chest discomfort.  Not a candidate for aspirin due to history of intracranial hemorrhage. Continue rosuvastatin Heart healthy low-sodium high-fiber diet Increase physical activity as tolerated   Severe tricuspid regurgitation-echocardiogram showed no changes from prior study.  Not felt to be a surgical candidate due to multiple comorbidities.  Echocardiogram 03/13/2023.  Details above   Symptomatic bradycardia-status post PPM.  Interrogated 4-24, histograms appropriate, no changes recommended. Follows with Dr. Ladona Ridgel     Disposition: Follow-up with Dr. Jens Som 09/11/2023   Thomasene Ripple. Aleiah Mohammed NP-C     04/05/2023, 3:03 PM  [facility] [address] Office [phone] Fax [phone]    I spent 14 minutes examining this patient, reviewing medications, and using patient centered shared decision making involving her cardiac care.  Prior to her visit I spent greater than 20 minutes reviewing her past medical history,  medications, and  prior cardiac tests.

## 2023-04-04 NOTE — Telephone Encounter (Signed)
Seems like encounter was open in error so closing encounter.  

## 2023-04-05 ENCOUNTER — Ambulatory Visit: Payer: Medicare Other | Attending: General Practice | Admitting: General Practice

## 2023-04-05 ENCOUNTER — Encounter: Payer: Self-pay | Admitting: General Practice

## 2023-04-05 VITALS — BP 126/64 | HR 89 | Ht 68.0 in | Wt 198.0 lb

## 2023-04-05 DIAGNOSIS — I5032 Chronic diastolic (congestive) heart failure: Secondary | ICD-10-CM | POA: Insufficient documentation

## 2023-04-05 DIAGNOSIS — I2510 Atherosclerotic heart disease of native coronary artery without angina pectoris: Secondary | ICD-10-CM | POA: Insufficient documentation

## 2023-04-05 DIAGNOSIS — I4819 Other persistent atrial fibrillation: Secondary | ICD-10-CM

## 2023-04-05 DIAGNOSIS — I071 Rheumatic tricuspid insufficiency: Secondary | ICD-10-CM | POA: Diagnosis not present

## 2023-04-05 DIAGNOSIS — R001 Bradycardia, unspecified: Secondary | ICD-10-CM | POA: Insufficient documentation

## 2023-04-05 NOTE — Patient Instructions (Addendum)
Medication Instructions:  The current medical regimen is effective;  continue present plan and medications as directed. Please refer to the Current Medication list given to you today.  *If you need a refill on your cardiac medications before your next appointment, please call your pharmacy*  Lab Work: NONE If you have labs (blood work) drawn today and your tests are completely normal, you will receive your results only by:  MyChart Message (if you have MyChart) OR A paper copy in the mail If you have any lab test that is abnormal or we need to change your treatment, we will call you to review the results.  Other Instructions TAKE AND LOG YOUR WEIGHT CALL IF >3 lb OVERNIGHT OR 5 lb/WEEKLY  PLEASE PURCHASE AND WEAR COMPRESSION STOCKINGS DAILY AND TAKE OFF AT BEDTIME. Compression stockings are elastic socks that squeeze the legs. They help to increase blood flow to the legs and to decrease swelling in the legs from fluid retention, and reduce the chance of developing blood clots in the lower legs. Please put on in the AM when dressing and off at night when dressing for bed.  LET THEM KNOW THAT YOU NEED KNEE HIGH'S WITH COMPRESSION OF 15-20 mmhg.  [employer]  [address] ([address] [phone]); [place], [place] [address]; [phone]  EMAIL   [email or web address].  PLEASE MAKE SURE TO ELEVATE YOUR FEET & LEGS WHILE SITTING, THIS WILL HELP WITH THE SWELLING ALSO.    Follow-Up: At [facility], you and your health needs are our priority.  As part of our continuing mission to provide you with exceptional heart care, we have created designated Provider Care Teams.  These Care Teams include your primary Cardiologist (physician) and Advanced Practice Providers (APPs -  Physician Assistants and Nurse Practitioners) who all work together to provide you with the care you need, when you need it.  We recommend signing up for the patient portal called "MyChart".  Sign up information is  provided on this After Visit Summary.  MyChart is used to connect with patients for Virtual Visits (Telemedicine).  Patients are able to view lab/test results, encounter notes, upcoming appointments, etc.  Non-urgent messages can be sent to your provider as well.   To learn more about what you can do with MyChart, go to [email or web address].    Your next appointment:   KEEP SCHEDULED APPT   Provider:   Olga Millers, MD     0

## 2023-04-17 ENCOUNTER — Telehealth: Payer: Self-pay | Admitting: Internal Medicine

## 2023-04-17 ENCOUNTER — Ambulatory Visit (INDEPENDENT_AMBULATORY_CARE_PROVIDER_SITE_OTHER): Payer: Medicare Other | Admitting: Internal Medicine

## 2023-04-17 ENCOUNTER — Encounter: Payer: Self-pay | Admitting: Internal Medicine

## 2023-04-17 VITALS — BP 120/70 | HR 92 | Ht 68.0 in | Wt 197.0 lb

## 2023-04-17 DIAGNOSIS — J84112 Idiopathic pulmonary fibrosis: Secondary | ICD-10-CM | POA: Diagnosis not present

## 2023-04-17 DIAGNOSIS — I519 Heart disease, unspecified: Secondary | ICD-10-CM

## 2023-04-17 DIAGNOSIS — G4733 Obstructive sleep apnea (adult) (pediatric): Secondary | ICD-10-CM | POA: Diagnosis not present

## 2023-04-17 DIAGNOSIS — R600 Localized edema: Secondary | ICD-10-CM

## 2023-04-17 DIAGNOSIS — I071 Rheumatic tricuspid insufficiency: Secondary | ICD-10-CM | POA: Diagnosis not present

## 2023-04-17 NOTE — Telephone Encounter (Signed)
[name], [name]  After last visit I recommended right heart catheterization.  Arlys John your office tried to call the patient but I guess he did not pick up.  After that he saw the PA and I am unclear what the plans if any other for the right heart catheterization.  I saw the patient today.  He has sleep apnea and also cirrhosis and left heart diastolic dysfunction.  I do think that he needs a right heart catheterization.  This would be with intent to start treprostinil depending on the results versus more diuresis or other heart failure management strategies as may be applicable  Thanks for consideration     SIGNATURE    Dr. Kalman Shan, M.D., F.C.C.P,  Pulmonary and Critical Care Medicine Staff Physician, [facility] Director - Interstitial Lung Disease  Program  Medical Director - [facility] Pulmonary Fibrosis [facility] at [facility] [place], [place], [address]   Pager: [phone], If no answer  -> Check AMION or Try [phone] Telephone (clinical office): [phone] Telephone (research): [phone]  10:40 AM 04/17/2023

## 2023-04-17 NOTE — Telephone Encounter (Signed)
   Thanks.  This will be standard of care.  This will not be through research

## 2023-04-17 NOTE — Progress Notes (Signed)
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  He had pulmonary function tests in 2020.  He had an autoimmune workup in 2014 that was negative.  He had a TLC of 63% in 2014 and a reduced DLCO.  He had progression from 20 14-20 20.  He has not been on any antifibrotic's now presents with shortness of breath with exertion and cough.    OV 03/20/2023  Subjective:  Patient ID: Jamie Cole., male , DOB: [birth year] , age 10 y.o. , MRN: [id number] , ADDRESS: [address] [place] [address] PCP Merri Brunette, MD Patient Care Team: Merri Brunette, MD as PCP - General Jens Som Madolyn Frieze, MD as PCP - Cardiology (Cardiology) Jens Som Madolyn Frieze, MD as Consulting Physician (Cardiology) Irena Reichmann, DO as Referring Physician (Family Medicine)  This Provider for this visit: Treatment Team:  Attending Provider: Kalman Shan, MD    03/20/2023 -   Chief Complaint  Patient presents with   Consult    Pulm. fibrosis.     HPI [name]. 82 y.o. -   HRCT 3 OV 04/17/2023  Subjective:  Patient ID: Jamie Cole., male , DOB: 07/27/1941 , age 75 y.o. , MRN: [id number] , ADDRESS: [address] [place] [address] PCP Merri Brunette, MD Patient Care Team: Merri Brunette, MD as PCP - General Jens Som Madolyn Frieze, MD as PCP - Cardiology (Cardiology) Jens Som Madolyn Frieze, MD as Consulting Physician (Cardiology) Irena Reichmann, DO as Referring Physician (Family Medicine)  This Provider for this visit: Treatment Team:  Attending Provider: Kalman Shan, MD    04/17/2023 -   Chief Complaint  Patient presents with   Follow-up    F/up on ILD     HPI Jamie G Laitinen  Jr. 82 y.o. -presents for ILD follow-up.  He brought his ILD questionnaire with him.  I made note of this for his 03/20/23 visit above.  Based on his answers, age greater than 62, progression of the CT scan and today on the pulmonary function test as well in the last 4 years setting the diagnosis IPF.  I gave this diagnosis to him and his wife.  His wife is the main historian here and she is an independent historian.  He has mild dementia.  But he does listen and he seems to have capacity to understand what is going on.  Did indicate to the wife serologies negative.  After the last visit I recommended right heart catheterization.  Dr. Ludwig Clarks office did try to reach him but could not get hold of him.  Subsequent to that he saw the PA and I am unclear as to what the right heart cath plans are.  It appears that more optimization of the left heart disease was done because his BNP was very high.  I also recommended a overnight pulse oximetry on room air while he uses his CPAP [he uses CPAP on room air] but this has not been done.  I have indicated to the wife we will reorder this.   Lab results do show he also has cirrhosis.  I had a detailed discussion with him and his wife about antifibrotic's.  Definitely do not recommend nintedanib is the first choice.  He is intolerant to anticoagulants and he also has cardiac disease.  Did bring up the option of pirfenidone but overall in balance he has a lot of fatigue and a lot of side effects and his pulm polypharmacy.  We took a shared decision making that we would pursue right heart catheterization first and try to optimize his heart failure/pulmonary hypertension.  Did discuss inhaled treprostinil as an option to treat his WHO group 3 pulmonary hypertension if it exists.  Therefore we took a shared decision making to proceed with right heart cath.  I have informed his cardiologist again.   SYMPTOM SCALE - ILD 04/17/2023  Current weight   O2 use 0  Shortness of  Breath 0 -> 5 scale with 5 being worst (score 6 If unable to do)  At rest 0  Simple tasks - showers, clothes change, eating, shaving 3  Household (dishes, doing bed, laundry) 4  Shopping 3  Walking level at own pace 3  Walking up Stairs 5  Total (30-36) Dyspnea Score 28      Non-dyspnea symptoms (0-> 5 scale) 04/17/2023  How bad is your cough? 2  How bad is your fatigue 4  How bad is nausea 1  How bad is vomiting?  1  How bad is diarrhea? 2  How bad is anxiety? 1  How bad is depression 1  Any chronic pain - if so where and how bad 3    HRCT 03/06/23   Narrative & Impression  CLINICAL DATA:  Interstitial lung disease.   EXAM: CT CHEST WITHOUT CONTRAST   TECHNIQUE: Multidetector CT imaging of the chest was performed following the standard protocol without intravenous contrast. High resolution imaging of the lungs, as well as inspiratory and expiratory imaging, was performed.   RADIATION DOSE REDUCTION: This exam was performed according to the departmental dose-optimization program which includes automated exposure control, adjustment of the mA and/or kV according to patient size and/or use of iterative reconstruction technique.   COMPARISON:  08/03/2021.   FINDINGS: Cardiovascular: Atherosclerotic calcification of the aorta and aortic valve. Enlarged pulmonic trunk and heart. No pericardial effusion.   Mediastinum/Nodes: Mediastinal lymph nodes measure up to 10 mm in the high right paratracheal station, as before. Hilar regions are difficult to evaluate without IV contrast. No axillary adenopathy. Esophagus is grossly unremarkable.   Lungs/Pleura: Interval progression in peripheral and basilar predominant subpleural reticulation, ground-glass and traction bronchiectasis/bronchiolectasis. Interstitial thickening/coarsening in the lung bases. No definitive honeycombing. Calcified granulomas. Scattered fissural thickening bilaterally. No pleural fluid. No  air trapping.   Upper Abdomen: Liver margin is irregular. Trace perihepatic ascites. Cholecystectomy. Visualized portions of the adrenal glands and right kidney are unremarkable. Hypodense and hyperdense lesions in the left kidney. No specific follow-up necessary. Spleen may be enlarged but is incompletely imaged. Visualized portions of the pancreas, stomach and bowel are grossly unremarkable. Ventral hernia repair. No upper abdominal adenopathy.   Musculoskeletal: Degenerative changes in the spine. No worrisome lytic or sclerotic lesions.   IMPRESSION: 1. Interval progression in fibrotic interstitial lung disease, detailed above. Patient reportedly has a known diagnosis of idiopathic pulmonary fibrosis, as indicated in the prior CT chest report. 2. Cirrhosis. 3. Trace perihepatic ascites. 4.  Aortic atherosclerosis (ICD10-I70.0). 5. Enlarged pulmonic trunk, indicative of pulmonary arterial hypertension.     Electronically Signed   By: Leanna Battles M.D.   On: 03/07/2023 16:17      ECHO 03/13/23   IMPRESSIONS     1. Left ventricular ejection fraction,  by estimation, is 55 to 60%. The  left ventricle has normal function. The left ventricle has no regional  wall motion abnormalities. Left ventricular diastolic parameters are  indeterminate. There is the  interventricular septum is flattened in diastole ('D' shaped left  ventricle), consistent with right ventricular volume overload.   2. Right ventricular systolic function is moderately reduced. The right  ventricular size is moderately enlarged. PASP likely underestimated due to  severity of TR.   3. Left atrial size was mildly dilated.   4. Right atrial size was moderately dilated.   5. The mitral valve is grossly normal. Trivial mitral valve  regurgitation.   6. Tricuspid valve regurgitation is severe.   7. The aortic valve is tricuspid. Aortic valve regurgitation is not  visualized. Aortic valve  sclerosis/calcification is present, without any  evidence of aortic stenosis.   8. Aortic dilatation noted. There is borderline dilatation of the  ascending aorta, measuring 38 mm.   9. The inferior vena cava is dilated in size with <50% respiratory  variability, suggesting right atrial pressure of 15 mmHg.   Comparison(s): No significant change from prior TTE. There continues to be  severe TR with moderate RV dysfunction and enlargement. PASP likely  underestimated in the setting of severe TR.    PFT     Latest Ref Rng & Units 04/04/2023    3:55 PM 10/28/2019   11:12 AM  PFT Results  FVC-Pre L 2.44  3.05   FVC-Predicted Pre % 69  82   FVC-Post L 2.42  2.96   FVC-Predicted Post % 68  80   Pre FEV1/FVC % % 86  85   Post FEV1/FCV % % 89  88   FEV1-Pre L 2.10  2.59   FEV1-Predicted Pre % 84  97   FEV1-Post L 2.15  2.60   DLCO uncorrected ml/min/mmHg 8.67  15.51   DLCO UNC% % 39  68   DLCO corrected ml/min/mmHg 9.68  19.26   DLCO COR %Predicted % 43  84   DLVA Predicted % 70  107   TLC L 4.14  5.27   TLC % Predicted % 64  81   RV % Predicted % 70  101     Latest Reference Range & Units 05/20/13 09:09 03/19/17 17:59 05/16/19 06:09 10/11/21 12:27 03/20/23 14:33 03/29/23 12:03  B Natriuretic Peptide 0.0 - 100.0 pg/mL  224.5 (H) 289.1 (H)     NT-Pro BNP 0 - 486 pg/mL    1,495 (H)  1,955 (H)  Pro B Natriuretic peptide (BNP) 0.0 - 100.0 pg/mL 64.0    169.0 (H)   (H): Data is abnormally high SEROLOGY   Latest Reference Range & Units 03/20/23 14:33  Glucose 70 - 99 mg/dL 94  ANA Titer 1  Negative  dsDNA Ab 0 - 9 IU/mL <1  ENA RNP Ab 0.0 - 0.9 AI 0.2  ENA SSA (RO) Ab 0.0 - 0.9 AI <0.2  ENA SSB (LA) Ab 0.0 - 0.9 AI <0.2  ENA SM Ab Ser-aCnc 0.0 - 0.9 AI <0.2     Latest Reference Range & Units 03/20/23 14:32 03/20/23 14:33  ENA RNP Ab 0.0 - 0.9 AI  0.2  ENA SM Ab Ser-aCnc 0.0 - 0.9 AI  <0.2  ENA SSA (RO) Ab 0.0 - 0.9 AI  <0.2  ENA SSB (LA) Ab 0.0 - 0.9 AI  <0.2  Scleroderma  (Scl-70) (ENA) Antibody, IgG 0.0 - 0.9 AI  0.2  QUANTIFERON-TB GOLD PLUS  Rpt   A.Fumigatus #1 Abs Negative   Negative  Micropolyspora faeni, IgG Negative   Negative  Thermoactinomyces vulgaris, IgG Negative   Negative  A. Pullulans Abs Negative   Negative  Thermoact. Saccharii Negative   Negative  Pigeon Serum Abs Negative   Negative  Rpt: View report in Results Review for more information   has a past medical history of (HFpEF) heart failure with preserved ejection fraction (HCC), Atrial fibrillation (HCC), BPH (benign prostatic hyperplasia), CAD (coronary artery disease), native coronary artery, DM (diabetes mellitus) (HCC), Hyperlipidemia type II, Hypertension, [name] (monoclonal gammopathy of unknown significance) (07/31/2018), OSA (obstructive sleep apnea), Pacemaker, Peripheral neuropathy (07/31/2018), Pulmonary fibrosis (HCC), and Subdural hematoma (HCC) (07/2012).   reports that he quit smoking about 32 years ago. His smoking use included cigarettes. He has a 40.00 pack-year smoking history. He has never used smokeless tobacco.  Past Surgical History:  Procedure Laterality Date   AMPUTATION Left 05/17/2019   Procedure: AMPUTATION LEFT FOURTH TOE;  Surgeon: Cammy Copa, MD;  Location[facility] OR;  Service: Orthopedics;  Laterality: Left;   AMPUTATION TOE Right 02/06/2020   Procedure: AMPUTATION TOE;  Surgeon: Nadara Mustard, MD;  Location: [facility] OR;  Service: Orthopedics;  Laterality: Right;   AMPUTATION TOE Right 08/17/2020   Procedure: AMPUTATION TOE 4th toe;  Surgeon: [name], DPM;  Location: [facility] ORS;  Service: Podiatry;  Laterality: Right;   APPENDECTOMY     CHOLECYSTECTOMY     CORONARY ARTERY BYPASS GRAFT     x3 (left internal mammary artery to distal left anterior descending coronary artery, saphenous vain graft to second circumflex marginal branch, saphenous vain graft to posterior descending coronary artery, endoscopic saphenous vain harvest from right thigh) and modified  Cox - Maze IV procedure.  Jamie Decent. [name],MD. Electronically signed CHO/MEDQ D: 01/09/2008/ JOB: [id number] cc:  Antionette Char MD   CRANIOTOMY  07/30/2012   Procedure: CRANIOTOMY HEMATOMA EVACUATION SUBDURAL;  Surgeon: Mariam Dollar, MD;  Location: [facility] NEURO ORS;  Service: Neurosurgery;  Laterality: Right;  Right craniotomy for evacuation of subdural hematoma   FOOT SURGERY     HERNIA REPAIR     INTRAMEDULLARY (IM) NAIL INTERTROCHANTERIC Right 02/04/2020   Procedure: INTRAMEDULLARY (IM) NAIL INTERTROCHANTRIC;  Surgeon: Beverely Low, MD;  Location: [facility] OR;  Service: Orthopedics;  Laterality: Right;   ORCHIECTOMY     Left  /  testicular cancer   PACEMAKER PLACEMENT     PPM - [employer]   PPM GENERATOR CHANGEOUT N/A 06/25/2019   Procedure: PPM GENERATOR CHANGEOUT;  Surgeon: Marinus Maw, MD;  Location: [facility] INVASIVE CV LAB;  Service: Cardiovascular;  Laterality: N/A;    Allergies  Allergen Reactions   Aricept [Donepezil] Other (See Comments)    Hallucination    Baclofen Itching   Lipitor [Atorvastatin] Other (See Comments)    Stiff joints   Nsaids Other (See Comments)    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   Warfarin And Related Other (See Comments)    Stiff joints   Enbrel [Etanercept] Itching    Immunization History  Administered Date(s) Administered   Fluad Quad(high Dose 65+) 08/18/2019, 11/08/2021   Influenza, High Dose Seasonal PF 09/17/2018   PFIZER(Purple Top)SARS-COV-2 Vaccination 06/21/2020, 07/12/2020   Tdap 08/14/2014, 07/06/2019   Zoster Recombinat (Shingrix) 06/30/2019    Family History  Problem Relation Age of Onset   Heart disease Father    Heart attack Father    Heart failure Father    Heart disease  Mother    Alzheimer's disease Mother    Dementia Mother      Current Outpatient Medications:    allopurinol (ZYLOPRIM) 300 MG tablet, Take 1 tablet (300 mg total) by mouth daily., Disp: 30 tablet, Rfl: 0   cephALEXin (KEFLEX) 500 MG capsule, Take 1 capsule (500 mg total) by  mouth 3 (three) times daily., Disp: 15 capsule, Rfl: 0   colchicine 0.6 MG tablet, TAKE 1 TO 3 TABLETS AS NEEDED DAILY, Disp: , Rfl:    digoxin (LANOXIN) 0.125 MG tablet, TAKE ONE TABLET BY MOUTH MONDAY THROUGH FRIDAY. DO NOT TAKE ON SATURDAY OR SUNDAY., Disp: 90 tablet, Rfl: 3   doxycycline (VIBRA-TABS) 100 MG tablet, Take 1 tablet (100 mg total) by mouth 2 (two) times daily., Disp: 14 tablet, Rfl: 0   ezetimibe (ZETIA) 10 MG tablet, Take 1 tablet (10 mg total) by mouth daily., Disp: 90 tablet, Rfl: 3   ferrous sulfate 325 (65 FE) MG tablet, Take 1 tablet (325 mg total) by mouth daily with breakfast., Disp: 30 tablet, Rfl: 3   memantine (NAMENDA) 10 MG tablet, Take 10 mg by mouth 2 (two) times daily., Disp: , Rfl:    metoprolol succinate (TOPROL-XL) 25 MG 24 hr tablet, Take 0.5 tablets by mouth daily., Disp: , Rfl:    mirabegron ER (MYRBETRIQ) 50 MG TB24 tablet, Take 1 tablet (50 mg total) by mouth daily., Disp: 30 tablet, Rfl: 3   modafinil (PROVIGIL) 100 MG tablet, TAKE 1 TABLET BY MOUTH EVERY DAY, Disp: 30 tablet, Rfl: 5   Multiple Vitamin (MULTIVITAMIN WITH MINERALS) TABS tablet, Take 1 tablet by mouth daily., Disp:  , Rfl:    pantoprazole (PROTONIX) 40 MG tablet, Take 1 tablet (40 mg total) by mouth daily., Disp: 30 tablet, Rfl: 0   rosuvastatin (CRESTOR) 40 MG tablet, Take 1 tablet (40 mg total) by mouth daily., Disp: 90 tablet, Rfl: 2   sertraline (ZOLOFT) 100 MG tablet, Take 2 tablets (200 mg total) by mouth daily. (Patient taking differently: Take 100 mg by mouth daily. 1.5 tablets daily), Disp: 30 tablet, Rfl: 0   spironolactone (ALDACTONE) 50 MG tablet, Take 50 mg by mouth daily., Disp: , Rfl:    tamsulosin (FLOMAX) 0.4 MG CAPS capsule, Take 1 capsule (0.4 mg total) by mouth at bedtime., Disp: 30 capsule, Rfl: 1   Teriparatide, Recombinant, 620 MCG/2.48ML SOPN, inject 20 mcg Subcutaneous Once a day (Patient taking differently: Takes injection twice a year.), Disp: 1 mL, Rfl: 11    torsemide (DEMADEX) 20 MG tablet, Take 4 tablets (80 mg total) by mouth 2 (two) times daily. 160 mg BID X1WEEK THEN BACK TO 80MG  DAILY (Patient taking differently: Take 30 mg by mouth 2 (two) times daily. 02/14/22 states pcp told to take 1 1/2 tablets daily), Disp: 45 tablet, Rfl: 6      Objective:   Vitals:   04/17/23 1015  BP: 120/70  Pulse: 92  SpO2: 97%  Weight: 197 lb (89.4 kg)  Height: 5\' 8"  (1.727 m)    Estimated body mass index is 29.95 kg/m as calculated from the following:   Height as of this encounter: 5\' 8"  (1.727 m).   Weight as of this encounter: 197 lb (89.4 kg).  @WEIGHTCHANGE @  [employer]   04/17/23 1015  Weight: 197 lb (89.4 kg)     Physical Exam   General: No distress. Obese Has cane. Mild decondiond Neuro: Alert and Oriented x 3. GCS 15. Speech normal Psych: Pleasant Resp:  Barrel Chest -  no.  Wheeze - no, Crackles - yes, No overt respiratory distress CVS: Normal heart sounds. Murmurs - YES Ext: Stigmata of Connective Tissue Disease - no. CHRONIC VENOUS STAIS SEDEMA +. NO CELLUKITSI HEENT: Normal upper airway. PEERL +. No post nasal drip        Assessment:       ICD-10-CM   1. IPF (idiopathic pulmonary fibrosis) (HCC)  J84.112 Pulse oximetry, overnight    Pulmonary function test    2. Severe tricuspid regurgitation  I07.1     3. Right ventricular dysfunction  I51.9     4. Pedal edema  R60.0     5. OSA (obstructive sleep apnea)  G47.33          Plan:     Patient Instructions  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.  We discussed antifibrotic but given the side effect profile and your multiple other symptoms it is best we finish the right heart catheterization and then reassess  Plan - Repeat spirometry and DLCO in 12 weeks -Simple walking desaturation test at follow-up in 12 weeks   Pedal edema Severe tricuspid regurgitation Right ventricular dysfunction  -   recommend right heart catheterization byt Dr Jacqulyn Cane or Dr Marca Ancona -> I will send a message again to Dr. Jens Som and the heart cath doctors    Hx of OSA  -Not sure why your oxygen overnight desaturation test was not done Plan  - continue CPAP -CMA to order 04/17/2023 on room air while using CPAP   Follow-up - Return in the next 12 weeks for 30-minute visit with Dr. Marchelle Gearing to regroup and discuss    SIGNATURE    Dr. Kalman Shan, M.D., F.C.C.P,  Pulmonary and Critical Care Medicine Staff Physician, [facility] Center Director - Interstitial Lung Disease  Program  Pulmonary Fibrosis [facility] at [facility] [place], [place], [address]  Pager: [phone], If no answer or between  15:00h - 7:00h: call [phone] Telephone: [phone]  10:45 AM 04/17/2023

## 2023-04-17 NOTE — Patient Instructions (Addendum)
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  We discussed antifibrotic but given the side effect profile and your multiple other symptoms it is best we finish the right heart catheterization and then reassess  Plan - Repeat spirometry and DLCO in 12 weeks -Simple walking desaturation test at follow-up in 12 weeks   Pedal edema Severe tricuspid regurgitation Right ventricular dysfunction  -  recommend right heart catheterization byt Dr Jacqulyn Cane or Dr Marca Ancona -> I will send a message again to Dr. Jens Som and the heart cath doctors    Hx of OSA  -Not sure why your oxygen overnight desaturation test was not done Plan  - continue CPAP -CMA to order 04/17/2023 on room air while using CPAP   Follow-up - Return in the next 12 weeks for 30-minute visit with Dr. Marchelle Gearing to regroup and discuss

## 2023-05-01 NOTE — Progress Notes (Signed)
Remote pacemaker transmission.   

## 2023-05-08 ENCOUNTER — Telehealth (HOSPITAL_COMMUNITY): Payer: Self-pay

## 2023-05-08 ENCOUNTER — Other Ambulatory Visit (HOSPITAL_COMMUNITY): Payer: Self-pay

## 2023-05-08 DIAGNOSIS — J8410 Pulmonary fibrosis, unspecified: Secondary | ICD-10-CM

## 2023-05-08 MED ORDER — SODIUM CHLORIDE 0.9% FLUSH
3.0000 mL | Freq: Two times a day (BID) | INTRAVENOUS | Status: DC
Start: 2023-05-08 — End: 2023-05-08

## 2023-05-08 NOTE — Telephone Encounter (Signed)
Spoke to patient's wife on scheduling catheterization.Reviewed instructions over the phone as well as letter mailed to patient.

## 2023-05-09 DIAGNOSIS — I509 Heart failure, unspecified: Secondary | ICD-10-CM | POA: Diagnosis not present

## 2023-05-09 DIAGNOSIS — I2721 Secondary pulmonary arterial hypertension: Secondary | ICD-10-CM | POA: Diagnosis not present

## 2023-05-15 DIAGNOSIS — M810 Age-related osteoporosis without current pathological fracture: Secondary | ICD-10-CM | POA: Diagnosis not present

## 2023-05-17 ENCOUNTER — Inpatient Hospital Stay: Payer: Medicare Other | Attending: Hematology and Oncology

## 2023-05-17 ENCOUNTER — Telehealth (HOSPITAL_COMMUNITY): Payer: Self-pay

## 2023-05-17 ENCOUNTER — Encounter (HOSPITAL_COMMUNITY): Payer: Self-pay

## 2023-05-17 DIAGNOSIS — D472 Monoclonal gammopathy: Secondary | ICD-10-CM | POA: Insufficient documentation

## 2023-05-17 DIAGNOSIS — D509 Iron deficiency anemia, unspecified: Secondary | ICD-10-CM | POA: Insufficient documentation

## 2023-05-17 DIAGNOSIS — D696 Thrombocytopenia, unspecified: Secondary | ICD-10-CM | POA: Diagnosis not present

## 2023-05-17 LAB — CBC WITH DIFFERENTIAL (CANCER CENTER ONLY)
Abs Immature Granulocytes: 0.01 10*3/uL (ref 0.00–0.07)
Basophils Absolute: 0 10*3/uL (ref 0.0–0.1)
Basophils Relative: 1 %
Eosinophils Absolute: 0.1 10*3/uL (ref 0.0–0.5)
Eosinophils Relative: 4 %
HCT: 36 % — ABNORMAL LOW (ref 39.0–52.0)
Hemoglobin: 12.2 g/dL — ABNORMAL LOW (ref 13.0–17.0)
Immature Granulocytes: 0 %
Lymphocytes Relative: 21 %
Lymphs Abs: 0.7 10*3/uL (ref 0.7–4.0)
MCH: 31.9 pg (ref 26.0–34.0)
MCHC: 33.9 g/dL (ref 30.0–36.0)
MCV: 94.2 fL (ref 80.0–100.0)
Monocytes Absolute: 0.2 10*3/uL (ref 0.1–1.0)
Monocytes Relative: 6 %
Neutro Abs: 2.2 10*3/uL (ref 1.7–7.7)
Neutrophils Relative %: 68 %
Platelet Count: 74 10*3/uL — ABNORMAL LOW (ref 150–400)
RBC: 3.82 MIL/uL — ABNORMAL LOW (ref 4.22–5.81)
RDW: 15.2 % (ref 11.5–15.5)
WBC Count: 3.2 10*3/uL — ABNORMAL LOW (ref 4.0–10.5)
nRBC: 0 % (ref 0.0–0.2)

## 2023-05-17 LAB — CMP (CANCER CENTER ONLY)
ALT: 14 U/L (ref 0–44)
AST: 29 U/L (ref 15–41)
Albumin: 4 g/dL (ref 3.5–5.0)
Alkaline Phosphatase: 88 U/L (ref 38–126)
Anion gap: 5 (ref 5–15)
BUN: 27 mg/dL — ABNORMAL HIGH (ref 8–23)
CO2: 30 mmol/L (ref 22–32)
Calcium: 9 mg/dL (ref 8.9–10.3)
Chloride: 106 mmol/L (ref 98–111)
Creatinine: 1.37 mg/dL — ABNORMAL HIGH (ref 0.61–1.24)
GFR, Estimated: 52 mL/min — ABNORMAL LOW (ref >60)
Glucose, Bld: 82 mg/dL (ref 70–99)
Potassium: 3.6 mmol/L (ref 3.5–5.1)
Sodium: 141 mmol/L (ref 135–145)
Total Bilirubin: 0.9 mg/dL (ref 0.3–1.2)
Total Protein: 8.1 g/dL (ref 6.5–8.1)

## 2023-05-17 LAB — IRON AND IRON BINDING CAPACITY (CC-WL,HP ONLY)
Iron: 41 ug/dL — ABNORMAL LOW (ref 45–182)
Saturation Ratios: 14 % — ABNORMAL LOW (ref 17.9–39.5)
TIBC: 297 ug/dL (ref 250–450)
UIBC: 256 ug/dL (ref 117–376)

## 2023-05-17 LAB — VITAMIN B12: Vitamin B-12: 439 pg/mL (ref 180–914)

## 2023-05-17 LAB — FERRITIN: Ferritin: 146 ng/mL (ref 24–336)

## 2023-05-17 NOTE — Telephone Encounter (Signed)
Spoke to patient's wife about catheterization time needed to be changed. June 4  time @10 :00 arrival time with procedure time at 12:00.New letter sent out to patient's address.

## 2023-05-18 LAB — KAPPA/LAMBDA LIGHT CHAINS
Kappa free light chain: 107.6 mg/L — ABNORMAL HIGH (ref 3.3–19.4)
Kappa, lambda light chain ratio: 2.93 — ABNORMAL HIGH (ref 0.26–1.65)
Lambda free light chains: 36.7 mg/L — ABNORMAL HIGH (ref 5.7–26.3)

## 2023-05-21 ENCOUNTER — Telehealth (HOSPITAL_COMMUNITY): Payer: Self-pay

## 2023-05-21 NOTE — Telephone Encounter (Signed)
Spoke to patient's wife regarding procedure scheduled for tomorrow. Aware of time. Nothing to eat or drink after midnight,hold torsemide am of procedure. Has transportation to and from procedure.

## 2023-05-22 ENCOUNTER — Ambulatory Visit (HOSPITAL_COMMUNITY)
Admission: RE | Admit: 2023-05-22 | Discharge: 2023-05-22 | Disposition: A | Discharge status: Home / Self Care | Payer: Medicare Other | Attending: Cardiology | Admitting: Cardiology

## 2023-05-22 ENCOUNTER — Encounter (HOSPITAL_COMMUNITY)
Admission: RE | Disposition: A | Discharge status: Home / Self Care | Payer: Self-pay | Source: Home / Self Care | Attending: Cardiology

## 2023-05-22 ENCOUNTER — Other Ambulatory Visit: Payer: Self-pay

## 2023-05-22 DIAGNOSIS — Z87891 Personal history of nicotine dependence: Secondary | ICD-10-CM | POA: Insufficient documentation

## 2023-05-22 DIAGNOSIS — I509 Heart failure, unspecified: Secondary | ICD-10-CM

## 2023-05-22 DIAGNOSIS — I272 Pulmonary hypertension, unspecified: Secondary | ICD-10-CM | POA: Diagnosis not present

## 2023-05-22 DIAGNOSIS — I5032 Chronic diastolic (congestive) heart failure: Secondary | ICD-10-CM | POA: Insufficient documentation

## 2023-05-22 DIAGNOSIS — I110 Hypertensive heart disease with heart failure: Secondary | ICD-10-CM | POA: Insufficient documentation

## 2023-05-22 DIAGNOSIS — J8410 Pulmonary fibrosis, unspecified: Secondary | ICD-10-CM

## 2023-05-22 HISTORY — PX: RIGHT HEART CATH: CATH118263

## 2023-05-22 LAB — POCT I-STAT EG7
Acid-base deficit: 1 mmol/L (ref 0.0–2.0)
Acid-base deficit: 1 mmol/L (ref 0.0–2.0)
Bicarbonate: 24.2 mmol/L (ref 20.0–28.0)
Bicarbonate: 24.3 mmol/L (ref 20.0–28.0)
Calcium, Ion: 1.12 mmol/L — ABNORMAL LOW (ref 1.15–1.40)
Calcium, Ion: 1.13 mmol/L — ABNORMAL LOW (ref 1.15–1.40)
HCT: 31 % — ABNORMAL LOW (ref 39.0–52.0)
HCT: 32 % — ABNORMAL LOW (ref 39.0–52.0)
Hemoglobin: 10.5 g/dL — ABNORMAL LOW (ref 13.0–17.0)
Hemoglobin: 10.9 g/dL — ABNORMAL LOW (ref 13.0–17.0)
O2 Saturation: 62 %
O2 Saturation: 64 %
Potassium: 3.9 mmol/L (ref 3.5–5.1)
Potassium: 4 mmol/L (ref 3.5–5.1)
Sodium: 143 mmol/L (ref 135–145)
Sodium: 143 mmol/L (ref 135–145)
TCO2: 25 mmol/L (ref 22–32)
TCO2: 26 mmol/L (ref 22–32)
pCO2, Ven: 43.1 mmHg — ABNORMAL LOW (ref 44–60)
pCO2, Ven: 43.1 mmHg — ABNORMAL LOW (ref 44–60)
pH, Ven: 7.357 (ref 7.25–7.43)
pH, Ven: 7.36 (ref 7.25–7.43)
pO2, Ven: 33 mmHg (ref 32–45)
pO2, Ven: 34 mmHg (ref 32–45)

## 2023-05-22 SURGERY — RIGHT HEART CATH
Anesthesia: LOCAL

## 2023-05-22 MED ORDER — SODIUM CHLORIDE 0.9% FLUSH: 3.0000 mL | INTRAVENOUS | Status: DC | PRN

## 2023-05-22 MED ORDER — HEPARIN (PORCINE) IN NACL 1000-0.9 UT/500ML-% IV SOLN
INTRAVENOUS | Status: DC | PRN
  Administered 2023-05-22: 500 mL

## 2023-05-22 MED ORDER — SODIUM CHLORIDE 0.9 % IV SOLN: 250.0000 mL | INTRAVENOUS | Status: DC | PRN

## 2023-05-22 MED ORDER — ACETAMINOPHEN 325 MG PO TABS: 650.0000 mg | ORAL_TABLET | ORAL | Status: DC | PRN

## 2023-05-22 MED ORDER — LABETALOL HCL 5 MG/ML IV SOLN: 10.0000 mg | INTRAVENOUS | Status: DC | PRN

## 2023-05-22 MED ORDER — LIDOCAINE HCL (PF) 1 % IJ SOLN
INTRAMUSCULAR | Status: AC
  Filled 2023-05-22: qty 30

## 2023-05-22 MED ORDER — LIDOCAINE HCL (PF) 1 % IJ SOLN
INTRAMUSCULAR | Status: DC | PRN
  Administered 2023-05-22: 2 mL

## 2023-05-22 MED ORDER — ONDANSETRON HCL 4 MG/2ML IJ SOLN: 4.0000 mg | Freq: Four times a day (QID) | INTRAMUSCULAR | Status: DC | PRN

## 2023-05-22 MED ORDER — SODIUM CHLORIDE 0.9% FLUSH: 3.0000 mL | Freq: Two times a day (BID) | INTRAVENOUS | Status: DC

## 2023-05-22 MED ORDER — TORSEMIDE 20 MG PO TABS: 40.0000 mg | ORAL_TABLET | Freq: Two times a day (BID) | ORAL | 6 refills | Status: DC

## 2023-05-22 MED ORDER — SODIUM CHLORIDE 0.9 % IV SOLN: INTRAVENOUS | Status: DC

## 2023-05-22 MED ORDER — HYDRALAZINE HCL 20 MG/ML IJ SOLN: 10.0000 mg | INTRAMUSCULAR | Status: DC | PRN

## 2023-05-22 MED ORDER — POTASSIUM CHLORIDE CRYS ER 20 MEQ PO TBCR: 20.0000 meq | EXTENDED_RELEASE_TABLET | Freq: Every day | ORAL | 3 refills | Status: DC

## 2023-05-22 SURGICAL SUPPLY — 6 items
CATH BALLN WEDGE 5F 110CM (CATHETERS) IMPLANT
GUIDEWIRE .025 260CM (WIRE) IMPLANT
KIT HEART LEFT (KITS) ×1 IMPLANT
PACK CARDIAC CATHETERIZATION (CUSTOM PROCEDURE TRAY) ×1 IMPLANT
SHEATH GLIDE SLENDER 4/5FR (SHEATH) IMPLANT
TRANSDUCER W/STOPCOCK (MISCELLANEOUS) ×1 IMPLANT

## 2023-05-22 NOTE — H&P (Signed)
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   Review of Systems: [y] = yes, [ ]  = no   General: Weight gain [ ] ; Weight loss [ ] ; Anorexia [ ] ; Fatigue [ ] ; Fever [ ] ; Chills [ ] ; Weakness [ ]   Cardiac: Chest pain/pressure [ ] ; Resting SOB [ ] ; Exertional SOB [ ] ; Orthopnea [ ] ; Pedal Edema [ ] ; Palpitations [ ] ; Syncope [ ] ; Presyncope [ ] ; Paroxysmal nocturnal dyspnea[ ]   Pulmonary: Cough [ ] ; Wheezing[ ] ; Hemoptysis[ ] ; Sputum [ ] ; Snoring [ ]   GI: Vomiting[ ] ; Dysphagia[ ] ; Melena[ ] ; Hematochezia [ ] ; Heartburn[ ] ; Abdominal pain [ ] ; Constipation [ ] ; Diarrhea [ ] ; BRBPR [ ]   GU: Hematuria[ ] ; Dysuria [ ] ; Nocturia[ ]   Vascular: Pain in legs with walking [ ] ; Pain in feet with lying flat [ ] ; Non-healing sores [ ] ; Stroke [ ] ; TIA [ ] ; Slurred speech [ ] ;  Neuro: Headaches[ ] ; Vertigo[ ] ; Seizures[ ] ; Paresthesias[ ] ;Blurred vision [ ] ; Diplopia [ ] ; Vision changes [ ]   Ortho/Skin: Arthritis [ ] ; Joint pain [ ] ; Muscle pain [ ] ; Joint swelling [ ] ; Back Pain [ ] ; Rash [ ]   Psych: Depression[ ] ; Anxiety[ ]   Heme: Bleeding problems [ ] ; Clotting disorders [ ] ; Anemia [ ]   Endocrine: Diabetes [ ] ; Thyroid dysfunction[ ]    Home Medications Prior to Admission medications   Medication Sig Start Date End Date Taking? Authorizing Provider  allopurinol (ZYLOPRIM) 300 MG tablet Take 1 tablet (300 mg total) by mouth daily. 02/24/20  Yes Angiulli, Mcarthur Rossetti, PA-C  digoxin (LANOXIN) 0.125 MG tablet TAKE ONE TABLET BY MOUTH MONDAY THROUGH FRIDAY. DO NOT TAKE ON SATURDAY OR SUNDAY. Patient taking differently: Take 0.125 mg by mouth See admin instructions. Take 0.125 mg by mouth Monday - Saturday, skipping Sunday. 01/08/23  Yes Marinus Maw, MD  ezetimibe  (ZETIA) 10 MG tablet Take 1 tablet (10 mg total) by mouth daily. 05/16/22  Yes Lewayne Bunting, MD  ferrous sulfate 325 (65 FE) MG tablet Take 1 tablet (325 mg total) by mouth daily with breakfast. 02/24/20  Yes Angiulli, Mcarthur Rossetti, PA-C  memantine (NAMENDA) 10 MG tablet Take 10 mg by mouth 2 (two) times daily. 08/29/20  Yes [provider]  metoprolol succinate (TOPROL-XL) 25 MG 24 hr tablet Take 12.5 mg by mouth daily.   Yes [provider]  mirabegron ER (MYRBETRIQ) 50 MG TB24 tablet Take 1 tablet (50 mg total) by mouth daily. 02/24/20  Yes Angiulli, Mcarthur Rossetti, PA-C  modafinil (PROVIGIL) 100 MG tablet TAKE 1 TABLET BY MOUTH EVERY DAY 09/28/22  Yes Butch Penny, NP  Multiple Vitamin (MULTIVITAMIN WITH MINERALS) TABS tablet Take 1 tablet by mouth daily. 02/10/20  Yes Alessandra Bevels, MD  NON FORMULARY Pt uses a cpap nightly   Yes [provider]  pantoprazole (PROTONIX) 40 MG tablet Take 1 tablet (40 mg total) by mouth daily. 02/24/20  Yes Angiulli, Mcarthur Rossetti, PA-C  rosuvastatin (CRESTOR) 40 MG tablet Take 1 tablet (40 mg total) by mouth daily. 02/24/20  Yes Angiulli, Mcarthur Rossetti, PA-C  sertraline (ZOLOFT) 100 MG tablet Take 2 tablets (200 mg total) by mouth daily. Patient  taking differently: Take 100 mg by mouth daily. 1.5 tablets daily 02/24/20  Yes Angiulli, Mcarthur Rossetti, PA-C  tamsulosin (FLOMAX) 0.4 MG CAPS capsule Take 1 capsule (0.4 mg total) by mouth at bedtime. 02/24/20  Yes Angiulli, Mcarthur Rossetti, PA-C  torsemide (DEMADEX) 20 MG tablet Take 4 tablets (80 mg total) by mouth 2 (two) times daily. 160 mg BID X1WEEK THEN BACK TO 80MG  DAILY Patient taking differently: Take 20 mg by mouth 2 (two) times daily. 11/22/21  Yes Cleaver, Thomasene Ripple, NP  cephALEXin (KEFLEX) 500 MG capsule Take 1 capsule (500 mg total) by mouth 3 (three) times daily. Patient not taking: Reported on 05/18/2023 03/20/23   Kalman Shan, MD  colchicine 0.6 MG tablet TAKE 1 TO 3 TABLETS AS NEEDED DAILY    [provider]  doxycycline (VIBRA-TABS) 100 MG tablet Take 1 tablet (100 mg total) by mouth 2 (two) times daily. Patient not taking: Reported on 05/18/2023 11/21/22   [name], DPM  silver sulfADIAZINE (SILVADENE) 1 % cream Apply 1 Application topically daily.    [provider]  Teriparatide, Recombinant, 620 MCG/2.48ML SOPN inject 20 mcg Subcutaneous Once a day Patient taking differently: Takes injection twice a year. 04/11/21       Past Medical History: Past Medical History:  Diagnosis Date   (HFpEF) heart failure with preserved ejection fraction (HCC)    a. 05/2013 Echo: EF 55%, mild LVH, diast dysfxn, Ao sclerosis, mildly dil LA, RV dysfxn (poorly visualized), PASP ;  b. 03/2017 Echo: EF 55-60%, no rwma, triv MR, mildly dil RV, mod TR, PASP .   Atrial fibrillation (HCC)    s/p Cox Maze 1/09;  Multaq Rx d/c'd in 2014 due to pulmo fibrosis;  coumadin d/c'd in 2014 due to spontaneous subdural hematoma   BPH (benign prostatic hyperplasia)    CAD (coronary artery disease), native coronary artery    a. s/p CABG 12/2007;  b. Myoview 12/2011: EF 66%, no scar or ischemia; normal.   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 Jude   Peripheral neuropathy 07/31/2018   Pulmonary fibrosis (HCC)    Multaq d/c'd 7/14   Subdural hematoma (HCC) 07/2012   spontaneous;  coumadin d/c'd => no longer a candidate for anticoagulation    Past Surgical History: Past Surgical History:  Procedure Laterality Date   AMPUTATION Left 05/17/2019   Procedure: AMPUTATION LEFT FOURTH TOE;  Surgeon: Cammy Copa, MD;  Location: [facility] OR;  Service: Orthopedics;  Laterality: Left;   AMPUTATION TOE Right 02/06/2020   Procedure: AMPUTATION TOE;  Surgeon: Nadara Mustard, MD;  Location: [facility] OR;  Service: Orthopedics;  Laterality: Right;    AMPUTATION TOE Right 08/17/2020   Procedure: AMPUTATION TOE 4th toe;  Surgeon: [name], DPM;  Location: [facility] ORS;  Service: Podiatry;  Laterality: Right;   APPENDECTOMY     CHOLECYSTECTOMY     CORONARY ARTERY BYPASS GRAFT     x3 (left internal mammary artery to distal left anterior descending coronary artery, saphenous vain graft to second circumflex marginal branch, saphenous vain graft to posterior descending coronary artery, endoscopic saphenous vain harvest from right thigh) and modified Cox - Maze IV procedure.  [name]. [name],MD. Electronically signed CHO/MEDQ D: 01/09/2008/ JOB: [id number] cc:  Antionette Char MD  CRANIOTOMY  07/30/2012   Procedure: CRANIOTOMY HEMATOMA EVACUATION SUBDURAL;  Surgeon: Mariam Dollar, MD;  Location: [facility] NEURO ORS;  Service: Neurosurgery;  Laterality: Right;  Right craniotomy for evacuation of subdural hematoma   FOOT SURGERY     HERNIA REPAIR     INTRAMEDULLARY (IM) NAIL INTERTROCHANTERIC Right 02/04/2020   Procedure: INTRAMEDULLARY (IM) NAIL INTERTROCHANTRIC;  Surgeon: Beverely Low, MD;  Location: [facility] OR;  Service: Orthopedics;  Laterality: Right;   ORCHIECTOMY     Left  /  testicular cancer   PACEMAKER PLACEMENT     PPM - St. Jude   PPM GENERATOR CHANGEOUT N/A 06/25/2019   Procedure: PPM GENERATOR CHANGEOUT;  Surgeon: Marinus Maw, MD;  Location: [facility] INVASIVE CV LAB;  Service: Cardiovascular;  Laterality: N/A;    Family History:  Family History  Problem Relation Age of Onset   Heart disease Father    Heart attack Father    Heart failure Father    Heart disease Mother    Alzheimer's disease Mother    Dementia Mother     Social History: Social History   Socioeconomic History   Marital status: Married    Spouse name: Engineer, maintenance (IT)   Number of children: 2   Years of education: Not on file   Highest education level: Not on file  Occupational History   Occupation: Retired- Theatre stage manager  Tobacco Use   Smoking status: Former    Packs/day: 2.00     Years: 20.00    Additional pack years: 0.00    Total pack years: 40.00    Types: Cigarettes    Quit date: 02/21/1991    Years since quitting: 32.2   Smokeless tobacco: Never  Vaping Use   Vaping Use: Never used  Substance and Sexual Activity   Alcohol use: No    Alcohol/week: 0.0 standard drinks of alcohol   Drug use: No   Sexual activity: Not Currently  Other Topics Concern   Not on file  Social History Narrative   Lives with wife   Right handed    Married with two children.     He is a Emergency planning/management officer.     Social Determinants of Health   Financial Resource Strain: Not on file  Food Insecurity: Not on file  Transportation Needs: Not on file  Physical Activity: Not on file  Stress: Not on file  Social Connections: Not on file    Allergies:  Allergies  Allergen Reactions   Aricept [Donepezil] Other (See Comments)    Hallucination    Baclofen Itching   Lipitor [Atorvastatin] Other (See Comments)    Stiff joints   Nsaids Other (See Comments)    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   Warfarin And Related Other (See Comments)    Stiff joints   Enbrel [Etanercept] Itching    Objective:    Vital Signs:       There were no vitals filed for this visit.   Physical Exam     General:  Well appearing. No respiratory difficulty HEENT: Normal Neck: Supple. no JVD. Carotids 2+ bilat; no bruits. No lymphadenopathy or thyromegaly appreciated. Cor: PMI nondisplaced. Regular rate & rhythm. No rubs, gallops or murmurs. Lungs: Dry crackles at bases.  Abdomen: Soft, nontender, nondistended. No hepatosplenomegaly. No bruits or masses. Good bowel sounds. Extremities: No cyanosis, clubbing, rash, edema Neuro: Alert & oriented x 3, cranial nerves grossly intact. moves all 4 extremities w/o difficulty. Affect pleasant.   Labs     Basic  Metabolic Panel: Recent Labs  Lab 05/17/23 0948  NA 141  K 3.6  CL 106  CO2 30  GLUCOSE 82  BUN 27*  CREATININE 1.37*  CALCIUM 9.0     Liver Function Tests: Recent Labs  Lab 05/17/23 0948  AST 29  ALT 14  ALKPHOS 88  BILITOT 0.9  PROT 8.1  ALBUMIN 4.0   No results for input(s): "LIPASE", "AMYLASE" in the last 168 hours. No results for input(s): "AMMONIA" in the last 168 hours.  CBC: Recent Labs  Lab 05/17/23 0948  WBC 3.2*  NEUTROABS 2.2  HGB 12.2*  HCT 36.0*  MCV 94.2  PLT 74*    Cardiac Enzymes: No results for input(s): "CKTOTAL", "CKMB", "CKMBINDEX", "TROPONINI" in the last 168 hours.  BNP: BNP (last 3 results) No results for input(s): "BNP" in the last 8760 hours.  ProBNP (last 3 results) Recent Labs    03/20/23 1433 03/29/23 1203  PROBNP 169.0* 1,955*     CBG: No results for input(s): "GLUCAP" in the last 168 hours.  Coagulation Studies: No results for input(s): "LABPROT", "INR" in the last 72 hours.  Imaging: No results found.   Assessment/Plan   RHC today to assess filling pressures and for PAH, ?PH-ILD.    Marca Ancona, MD 05/22/2023, 10:17 AM  Advanced Heart Failure Team Pager [phone] (M-F; 7a - 5p)  Please contact [facility] for night-coverage after hours (4p -7a ) and weekends on [email or web address]

## 2023-05-22 NOTE — Progress Notes (Signed)
[name] ONEAL AWARE OF LOW PLATELET COUNT. NO NEW ORDERS.

## 2023-05-22 NOTE — Discharge Instructions (Addendum)
1. Increase torsemide to 40 mg (2 tablets) twice a day.  2. Start potassium 20 mEq daily.   Brachial Site Care   This sheet gives you information about how to care for yourself after your procedure. Your health care provider may also give you more specific instructions. If you have problems or questions, contact your health care provider. What can I expect after the procedure? After the procedure, it is common to have: Bruising and tenderness at the catheter insertion area. Follow these instructions at home:  Insertion site care Follow instructions from your health care provider about how to take care of your insertion site. Make sure you: Wash your hands with soap and water before you change your bandage (dressing). If soap and water are not available, use hand sanitizer. Remove your dressing as told by your health care provider. In 24 hours Check your insertion site every day for signs of infection. Check for: Redness, swelling, or pain. Pus or a bad smell. Warmth. You may shower 24-48 hours after the procedure. Do not apply powder or lotion to the site.  Activity For 24 hours after the procedure, or as directed by your health care provider: Do not push or pull heavy objects with the affected arm. Do not drive yourself home from the hospital or clinic. You may drive 24 hours after the procedure unless your health care provider tells you not to. Do not lift anything that is heavier than 10 lb (4.5 kg), or the limit that you are told, until your health care provider says that it is safe.  For 24 hours

## 2023-05-22 NOTE — Progress Notes (Signed)
Patient ambulated without difficulty or bleeding. Discharge to home with wife who will drive and stay with patient for 24 hours. Verbalized understanding to the discharge insstructions without questions.

## 2023-05-23 ENCOUNTER — Encounter (HOSPITAL_COMMUNITY): Payer: Self-pay | Admitting: Cardiology

## 2023-05-25 ENCOUNTER — Other Ambulatory Visit: Payer: Self-pay

## 2023-05-25 ENCOUNTER — Encounter: Payer: Self-pay | Admitting: Hematology and Oncology

## 2023-05-25 ENCOUNTER — Inpatient Hospital Stay: Payer: Medicare Other | Attending: Hematology and Oncology | Admitting: Hematology and Oncology

## 2023-05-25 VITALS — BP 113/59 | HR 89 | Temp 97.5°F | Resp 18 | Ht 68.0 in | Wt 191.2 lb

## 2023-05-25 DIAGNOSIS — D61818 Other pancytopenia: Secondary | ICD-10-CM | POA: Insufficient documentation

## 2023-05-25 DIAGNOSIS — K7460 Unspecified cirrhosis of liver: Secondary | ICD-10-CM | POA: Diagnosis not present

## 2023-05-25 DIAGNOSIS — D509 Iron deficiency anemia, unspecified: Secondary | ICD-10-CM | POA: Insufficient documentation

## 2023-05-25 DIAGNOSIS — N189 Chronic kidney disease, unspecified: Secondary | ICD-10-CM | POA: Insufficient documentation

## 2023-05-25 DIAGNOSIS — D472 Monoclonal gammopathy: Secondary | ICD-10-CM | POA: Diagnosis not present

## 2023-05-25 DIAGNOSIS — R161 Splenomegaly, not elsewhere classified: Secondary | ICD-10-CM | POA: Diagnosis not present

## 2023-05-25 LAB — MULTIPLE MYELOMA PANEL, SERUM
Albumin SerPl Elph-Mcnc: 3.3 g/dL (ref 2.9–4.4)
Albumin/Glob SerPl: 0.9 (ref 0.7–1.7)
Alpha 1: 0.3 g/dL (ref 0.0–0.4)
Alpha2 Glob SerPl Elph-Mcnc: 0.7 g/dL (ref 0.4–1.0)
B-Globulin SerPl Elph-Mcnc: 1.3 g/dL (ref 0.7–1.3)
Gamma Glob SerPl Elph-Mcnc: 1.8 g/dL (ref 0.4–1.8)
Globulin, Total: 4 g/dL — ABNORMAL HIGH (ref 2.2–3.9)
IgA: 793 mg/dL — ABNORMAL HIGH (ref 61–437)
IgG (Immunoglobin G), Serum: 1804 mg/dL — ABNORMAL HIGH (ref 603–1613)
IgM (Immunoglobulin M), Srm: 107 mg/dL (ref 15–143)
M Protein SerPl Elph-Mcnc: 0.4 g/dL — ABNORMAL HIGH
Total Protein ELP: 7.3 g/dL (ref 6.0–8.5)

## 2023-05-25 NOTE — Progress Notes (Signed)
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 I believe the reduced white blood cell count and thrombocytopenia is due to sequestration from splenomegaly The cause of splenomegaly is due to liver cirrhosis causing splenic congestion There is nothing I could do to fix his pancytopenia He is not symptomatic from pancytopenia and does not need transfusion support from the low platelet standpoint There is no role for G-CSF support for reduced white blood cell counts I do not recommend bone marrow aspirate and biopsy as it would not change management We will observe this closely  From the low platelet standpoint, there is no contraindication for him to receive anticoagulation therapy or antiplatelet agent if needed His recent acute on chronic thrombocytopenia could be due to destruction from valvular heart disease I would defer to his cardiologist for management  Orders Placed This Encounter  Procedures   Iron and Iron Binding Capacity ([facility] only)    Standing Status:   Future    Standing Expiration Date:   05/24/2024   Ferritin    Standing Status:   Future    Standing Expiration Date:   05/24/2024   CMP (Cancer Center only)    Standing Status:   Future    Standing Expiration Date:   05/24/2024   CBC with Differential (Cancer Center Only)    Standing Status:   Future    Standing Expiration Date:   05/24/2024   Kappa/lambda light chains    Standing Status:   Standing    Number of Occurrences:   22    Standing Expiration Date:   05/24/2024   Multiple Myeloma Panel (SPEP&IFE w/QIG)    Standing Status:   Standing    Number of Occurrences:   22    Standing Expiration Date:   05/24/2024     The total time spent in the appointment was 25 minutes encounter with patients including review of chart and various tests results, discussions about plan of care and coordination of care plan   All questions were answered. The patient knows to call the clinic with any problems, questions or concerns. No barriers to learning was detected.    Artis Delay, [name] 6/7/202410:57 AM  INTERVAL HISTORY: [name]. 82 y.o. male returns for review test results I reviewed multiple test results with the patient and his wife He underwent recent cardiac cath for evaluation of shortness of breath Appointment is pending to return within the month to see his cardiologist He has shortness of breath on minimal exertion He bruises easily He denies recent falls  SUMMARY OF HEMATOLOGIC HISTORY:  Gearld G Nelva Bush. is here because of thrombocytopenia. 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   He was found to have abnormal CBC from abnormal blood work from routine blood work monitoring. I have reviewed outside records from his primary care doctor.   He is noted to have mild pancytopenia since 11/17/2015. On 11/17/2015, white blood cell count 5.9, hemoglobin 12.7 and platelet count 106 On 03/13/2016, white blood cell count 6.6, hemoglobin 12.6 and platelet count 103 On 09/06/2015, white blood cell count 6.6, hemoglobin 11.9 and platelet count 102 On 10/25/2016, white count  5.2, hemoglobin 12.7 and platelet count 86 On 11/08/16, white blood cell count 5.3, hemoglobin 12.2 implant, 107. On 09/05/2016, serum ferritin was high at 220, iron study showed serum iron low at 24 and 9% iron saturation.   Some older scanned records dated back to 12/13/2011 showed low platelet count of 101 On 12/21/2011, he had normal platelet count of 210 On 07/30/2012, his platelet count was borderline low at 143   On  09/08/2016, [name], such as spontaneous epistaxis, hematuria, 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  He is being observed Repeat CT imaging of the abdomen and pelvis in August 2019 confirmed liver cirrhosis and splenomegaly He was found to have iron deficiency anemia and was placed on oral iron supplement The patient also have [name] and is being observed In October 2020, he received intravenous iron infusion  I have reviewed the past medical history, past surgical history, social history and family history with the patient and they are unchanged from previous note.  ALLERGIES:  is allergic to aricept [donepezil], baclofen, lipitor [atorvastatin], nsaids, warfarin and related, and enbrel [etanercept].  MEDICATIONS:  Current Outpatient Medications  Medication Sig Dispense Refill   allopurinol (ZYLOPRIM) 300 MG tablet Take 1 tablet (300 mg total) by mouth daily. 30 tablet 0   cephALEXin (KEFLEX) 500 MG capsule Take 1 capsule (500 mg total) by mouth 3 (three) times daily. (Patient not taking: Reported on 05/18/2023) 15 capsule 0   colchicine 0.6 MG tablet TAKE 1 TO 3 TABLETS AS NEEDED DAILY     digoxin (LANOXIN) 0.125 MG tablet TAKE ONE TABLET BY MOUTH MONDAY THROUGH FRIDAY. DO NOT TAKE ON SATURDAY OR SUNDAY. (Patient taking differently: Take 0.125  mg by mouth See admin instructions. Take 0.125 mg by mouth Monday - Saturday, skipping Sunday.) 90 tablet 3   doxycycline (VIBRA-TABS) 100 MG tablet Take 1 tablet (100 mg total) by mouth 2 (two) times daily. (Patient not taking: Reported on 05/18/2023) 14 tablet 0   ezetimibe (ZETIA) 10 MG tablet Take 1 tablet (10 mg total) by mouth daily. 90 tablet 3   ferrous sulfate 325 (65 FE) MG tablet Take 1 tablet (325 mg total) by mouth daily with breakfast. 30 tablet 3   memantine (NAMENDA) 10 MG tablet Take 10 mg by mouth 2 (two) times daily.     metoprolol succinate (TOPROL-XL) 25 MG 24 hr tablet Take 12.5 mg by mouth daily.     mirabegron ER (MYRBETRIQ) 50 MG TB24 tablet Take 1 tablet (50 mg total) by mouth daily. 30 tablet 3   modafinil (PROVIGIL) 100 MG tablet TAKE 1 TABLET BY MOUTH EVERY DAY 30 tablet 5   Multiple Vitamin (MULTIVITAMIN WITH MINERALS) TABS tablet Take 1 tablet by mouth daily.     NON FORMULARY Pt uses a cpap nightly     pantoprazole (PROTONIX) 40 MG tablet Take 1 tablet (40 mg total)  by mouth daily. 30 tablet 0   potassium chloride SA (KLOR-CON M) 20 MEQ tablet Take 1 tablet (20 mEq total) by mouth daily. 90 tablet 3   rosuvastatin (CRESTOR) 40 MG tablet Take 1 tablet (40 mg total) by mouth daily. 90 tablet 2   sertraline (ZOLOFT) 100 MG tablet Take 2 tablets (200 mg total) by mouth daily. (Patient taking differently: Take 100 mg by mouth daily. 1.5 tablets daily) 30 tablet 0   silver sulfADIAZINE (SILVADENE) 1 % cream Apply 1 Application topically daily.     tamsulosin (FLOMAX) 0.4 MG CAPS capsule Take 1 capsule (0.4 mg total) by mouth at bedtime. 30 capsule 1   Teriparatide, Recombinant, 620 MCG/2.48ML SOPN inject 20 mcg Subcutaneous Once a day (Patient taking differently: Takes injection twice a year.) 1 mL 11   torsemide (DEMADEX) 20 MG tablet Take 2 tablets (40 mg total) by mouth 2 (two) times daily. 45 tablet 6   No current facility-administered medications for this visit.      REVIEW OF SYSTEMS:   Constitutional: Denies fevers, chills or night sweats Eyes: Denies blurriness of vision Ears, nose, mouth, throat, and face: Denies mucositis or sore throat Respiratory: Denies cough, dyspnea or wheezes Cardiovascular: Denies palpitation, chest discomfort or lower extremity swelling Gastrointestinal:  Denies nausea, heartburn or change in bowel habits Skin: Denies abnormal skin rashes Lymphatics: Denies new lymphadenopathy Neurological:Denies numbness, tingling or new weaknesses Behavioral/Psych: Mood is stable, no new changes  All other systems were reviewed with the patient and are negative.  PHYSICAL EXAMINATION: ECOG PERFORMANCE STATUS: 1 - Symptomatic but completely ambulatory  Vitals:   05/25/23 0956  BP: (!) 113/59  Pulse: 89  Resp: 18  Temp: (!) 97.5 F (36.4 C)  SpO2: 100%   Filed Weights   05/25/23 0956  Weight: 191 lb 3.2 oz (86.7 kg)    GENERAL:alert, no distress and comfortable SKIN: Noted skin lesion on his left cheek.  Extensive bruises are noted NEURO: alert & oriented x 3 with fluent speech, no focal motor/sensory deficits  LABORATORY DATA:  I have reviewed the data as listed     Component Value Date/Time   NA 143 05/22/2023 1125   NA 143 05/22/2023 1125   NA 145 (H) 03/29/2023 1203   NA 141 02/23/2017 1237   K 3.9 05/22/2023 1125   K 4.0 05/22/2023 1125   K 3.8 02/23/2017 1237   CL 106 05/17/2023 0948   CO2 30 05/17/2023 0948   CO2 27 02/23/2017 1237   GLUCOSE 82 05/17/2023 0948   GLUCOSE 113 02/23/2017 1237   BUN 27 (H) 05/17/2023 0948   BUN 31 (H) 03/29/2023 1203   BUN 18.3 02/23/2017 1237   CREATININE 1.37 (H) 05/17/2023 0948   CREATININE 1.2 02/23/2017 1237   CALCIUM 9.0 05/17/2023 0948   CALCIUM 9.6 02/23/2017 1237   PROT 8.1 05/17/2023 0948   PROT 7.8 03/29/2023 1203   PROT 8.0 02/23/2017 1237   ALBUMIN 4.0 05/17/2023 0948   ALBUMIN 4.0 03/29/2023 1203   ALBUMIN 4.1 02/23/2017 1237   AST 29 05/17/2023  0948   AST 24 02/23/2017 1237   ALT 14 05/17/2023 0948   ALT 15 02/23/2017 1237   ALKPHOS 88 05/17/2023 0948   ALKPHOS 105 02/23/2017 1237   BILITOT 0.9 05/17/2023 0948   BILITOT 0.98 02/23/2017 1237   GFRNONAA 52 (L) 05/17/2023 0948   GFRAA 72 02/01/2021 1410    No results found for: "SPEP", "UPEP"  Lab Results  Component Value Date  WBC 3.2 (L) 05/17/2023   NEUTROABS 2.2 05/17/2023   HGB 10.5 (L) 05/22/2023   HGB 10.9 (L) 05/22/2023   HCT 31.0 (L) 05/22/2023   HCT 32.0 (L) 05/22/2023   MCV 94.2 05/17/2023   PLT 74 (L) 05/17/2023      Chemistry      Component Value Date/Time   NA 143 05/22/2023 1125   NA 143 05/22/2023 1125   NA 145 (H) 03/29/2023 1203   NA 141 02/23/2017 1237   K 3.9 05/22/2023 1125   K 4.0 05/22/2023 1125   K 3.8 02/23/2017 1237   CL 106 05/17/2023 0948   CO2 30 05/17/2023 0948   CO2 27 02/23/2017 1237   BUN 27 (H) 05/17/2023 0948   BUN 31 (H) 03/29/2023 1203   BUN 18.3 02/23/2017 1237   CREATININE 1.37 (H) 05/17/2023 0948   CREATININE 1.2 02/23/2017 1237      Component Value Date/Time   CALCIUM 9.0 05/17/2023 0948   CALCIUM 9.6 02/23/2017 1237   ALKPHOS 88 05/17/2023 0948   ALKPHOS 105 02/23/2017 1237   AST 29 05/17/2023 0948   AST 24 02/23/2017 1237   ALT 14 05/17/2023 0948   ALT 15 02/23/2017 1237   BILITOT 0.9 05/17/2023 0948   BILITOT 0.98 02/23/2017 1237

## 2023-05-25 NOTE — Assessment & Plan Note (Signed)
The patient has multifactorial pancytopenia I believe the reduced white blood cell count and thrombocytopenia is due to sequestration from splenomegaly The cause of splenomegaly is due to liver cirrhosis causing splenic congestion There is nothing I could do to fix his pancytopenia He is not symptomatic from pancytopenia and does not need transfusion support from the low platelet standpoint There is no role for G-CSF support for reduced white blood cell counts I do not recommend bone marrow aspirate and biopsy as it would not change management We will observe this closely  From the low platelet standpoint, there is no contraindication for him to receive anticoagulation therapy or antiplatelet agent if needed His recent acute on chronic thrombocytopenia could be due to destruction from valvular heart disease I would defer to his cardiologist for management

## 2023-05-25 NOTE — Assessment & Plan Note (Signed)
Myeloma panel is pending Light chain studies are elevated due to chronic kidney disease but overall stable Monitor closely in 6 months

## 2023-06-06 DIAGNOSIS — C44329 Squamous cell carcinoma of skin of other parts of face: Secondary | ICD-10-CM | POA: Diagnosis not present

## 2023-06-06 DIAGNOSIS — Z85828 Personal history of other malignant neoplasm of skin: Secondary | ICD-10-CM | POA: Diagnosis not present

## 2023-06-06 DIAGNOSIS — D0439 Carcinoma in situ of skin of other parts of face: Secondary | ICD-10-CM | POA: Diagnosis not present

## 2023-06-06 DIAGNOSIS — D485 Neoplasm of uncertain behavior of skin: Secondary | ICD-10-CM | POA: Diagnosis not present

## 2023-06-14 ENCOUNTER — Ambulatory Visit (INDEPENDENT_AMBULATORY_CARE_PROVIDER_SITE_OTHER): Payer: Medicare Other | Admitting: Podiatry

## 2023-06-14 DIAGNOSIS — E119 Type 2 diabetes mellitus without complications: Secondary | ICD-10-CM | POA: Diagnosis not present

## 2023-06-14 DIAGNOSIS — L97511 Non-pressure chronic ulcer of other part of right foot limited to breakdown of skin: Secondary | ICD-10-CM

## 2023-06-14 DIAGNOSIS — E1149 Type 2 diabetes mellitus with other diabetic neurological complication: Secondary | ICD-10-CM

## 2023-06-14 MED ORDER — SILVER SULFADIAZINE 1 % EX CREA: 1.0000 | TOPICAL_CREAM | Freq: Every day | CUTANEOUS | 0 refills | Status: DC

## 2023-06-14 NOTE — Progress Notes (Signed)
Subjective: Chief Complaint  Patient presents with   Nail Problem    Routine Foot Care- Nail trim    Callouses    Callus trim to right foot.      82 year old male presents the office today for diabetic foot exam and for nail trim.  Denies any recent ulcers or changes.  There is a thick callus on the bottom of his right foot.  No open lesions that he reports no drainage or pus.  Objective: AAO x3, NAD DP/PT pulses palpable 1/4 b/l, CRT less than 3 seconds Sensation decreased with Semmes Weinstein monofilament. Thick callus with dried blood present right foot submetatarsal 1.  Upon debridement there is 1 small superficial granular wound present.  There is no probing, undermining or tunneling.  No surrounding erythema, ascending cellulitis.  No fluctuation, crepitation, malodor.  After debridement the wound measured 0.3 x 0.3 x 0.1 cm.  Prior to debridement we will measure the wound given the callus formation. Multiple digital contractures are present with history of toe amputations  No pain with calf compression, swelling, warmth, erythema   Assessment: Ulceration right foot; neuropathy   Plan: -Medically necessary wound debridement was performed today.  Patient was #[id number] with scalpel to sharply debride hyperkeratotic tissue reveals granular ulceration underneath.  I debrided nonviable tissue to help with wound healing.  Debrided the wound base to healthy, granular tissue.  Slight bleeding occurred during debridement with minimal blood loss.  Hemostasis achieved.  Cleaned area with saline.  Silvadene was applied followed by dressing.  Continue daily dressing changes and offloading at all times. Monitor for any clinical signs or symptoms of infection and directed to call the office immediately should any occur or go to the ER. -Vascular: ABI completed in November 2023 with adequate blood flow.  If no healing or delayed healing will repeat. -Encouraged her to pursue the other remaining toenails  with any complications or bleeding as a courtesy as they were only slightly elongated. -Discussed daily foot inspection, glucose control.   Return for right foot ulcer in 3-4 weeks.  [name] DPM

## 2023-06-18 ENCOUNTER — Other Ambulatory Visit: Payer: Self-pay | Admitting: Gastroenterology

## 2023-06-18 DIAGNOSIS — K7460 Unspecified cirrhosis of liver: Secondary | ICD-10-CM

## 2023-06-19 ENCOUNTER — Ambulatory Visit: Payer: Medicare Other

## 2023-06-19 DIAGNOSIS — I495 Sick sinus syndrome: Secondary | ICD-10-CM | POA: Diagnosis not present

## 2023-06-20 LAB — CUP PACEART REMOTE DEVICE CHECK
Battery Remaining Longevity: 77 mo
Battery Remaining Percentage: 64 %
Battery Voltage: 2.99 V
Brady Statistic AP VP Percent: 25 %
Brady Statistic AP VS Percent: 11 %
Brady Statistic AS VP Percent: 4.2 %
Brady Statistic AS VS Percent: 46 %
Brady Statistic RA Percent Paced: 4.8 %
Brady Statistic RV Percent Paced: 6.2 %
Date Time Interrogation Session: 20240702042824
Implantable Lead Connection Status: 753985
Implantable Lead Connection Status: 753985
Implantable Lead Implant Date: 20090420
Implantable Lead Implant Date: 20090420
Implantable Lead Location: 753859
Implantable Lead Location: 753860
Implantable Pulse Generator Implant Date: 20200708
Lead Channel Impedance Value: 460 Ohm
Lead Channel Impedance Value: 460 Ohm
Lead Channel Pacing Threshold Amplitude: 0.75 V
Lead Channel Pacing Threshold Amplitude: 1 V
Lead Channel Pacing Threshold Pulse Width: 0.4 ms
Lead Channel Pacing Threshold Pulse Width: 0.4 ms
Lead Channel Sensing Intrinsic Amplitude: 2.2 mV
Lead Channel Sensing Intrinsic Amplitude: 6.7 mV
Lead Channel Setting Pacing Amplitude: 2 V
Lead Channel Setting Pacing Amplitude: 2.5 V
Lead Channel Setting Pacing Pulse Width: 0.4 ms
Lead Channel Setting Sensing Sensitivity: 2 mV
Pulse Gen Model: 2272
Pulse Gen Serial Number: 9149180

## 2023-06-26 ENCOUNTER — Ambulatory Visit (HOSPITAL_COMMUNITY)
Admit: 2023-06-26 | Discharge: 2023-06-26 | Disposition: A | Discharge status: Home / Self Care | Payer: Medicare Other | Source: Ambulatory Visit | Attending: Cardiology | Admitting: Cardiology

## 2023-06-26 ENCOUNTER — Other Ambulatory Visit (HOSPITAL_COMMUNITY): Payer: Self-pay

## 2023-06-26 ENCOUNTER — Encounter (HOSPITAL_COMMUNITY): Payer: Self-pay | Admitting: Cardiology

## 2023-06-26 VITALS — BP 116/72 | HR 98 | Wt 179.4 lb

## 2023-06-26 DIAGNOSIS — I071 Rheumatic tricuspid insufficiency: Secondary | ICD-10-CM

## 2023-06-26 DIAGNOSIS — I4821 Permanent atrial fibrillation: Secondary | ICD-10-CM | POA: Insufficient documentation

## 2023-06-26 DIAGNOSIS — R0602 Shortness of breath: Secondary | ICD-10-CM | POA: Insufficient documentation

## 2023-06-26 DIAGNOSIS — I2510 Atherosclerotic heart disease of native coronary artery without angina pectoris: Secondary | ICD-10-CM | POA: Insufficient documentation

## 2023-06-26 DIAGNOSIS — F03A0 Unspecified dementia, mild, without behavioral disturbance, psychotic disturbance, mood disturbance, and anxiety: Secondary | ICD-10-CM | POA: Insufficient documentation

## 2023-06-26 DIAGNOSIS — Z951 Presence of aortocoronary bypass graft: Secondary | ICD-10-CM | POA: Insufficient documentation

## 2023-06-26 DIAGNOSIS — G4733 Obstructive sleep apnea (adult) (pediatric): Secondary | ICD-10-CM | POA: Insufficient documentation

## 2023-06-26 DIAGNOSIS — I110 Hypertensive heart disease with heart failure: Secondary | ICD-10-CM | POA: Insufficient documentation

## 2023-06-26 DIAGNOSIS — M869 Osteomyelitis, unspecified: Secondary | ICD-10-CM

## 2023-06-26 DIAGNOSIS — I495 Sick sinus syndrome: Secondary | ICD-10-CM | POA: Insufficient documentation

## 2023-06-26 DIAGNOSIS — Z79899 Other long term (current) drug therapy: Secondary | ICD-10-CM | POA: Diagnosis not present

## 2023-06-26 DIAGNOSIS — I5032 Chronic diastolic (congestive) heart failure: Secondary | ICD-10-CM

## 2023-06-26 DIAGNOSIS — I272 Pulmonary hypertension, unspecified: Secondary | ICD-10-CM | POA: Diagnosis not present

## 2023-06-26 DIAGNOSIS — I50812 Chronic right heart failure: Secondary | ICD-10-CM

## 2023-06-26 DIAGNOSIS — J84112 Idiopathic pulmonary fibrosis: Secondary | ICD-10-CM | POA: Diagnosis not present

## 2023-06-26 LAB — COMPREHENSIVE METABOLIC PANEL
ALT: 26 U/L (ref 0–44)
AST: 48 U/L — ABNORMAL HIGH (ref 15–41)
Albumin: 4.2 g/dL (ref 3.5–5.0)
Alkaline Phosphatase: 85 U/L (ref 38–126)
Anion gap: 8 (ref 5–15)
BUN: 24 mg/dL — ABNORMAL HIGH (ref 8–23)
CO2: 26 mmol/L (ref 22–32)
Calcium: 9.7 mg/dL (ref 8.9–10.3)
Chloride: 103 mmol/L (ref 98–111)
Creatinine, Ser: 1.4 mg/dL — ABNORMAL HIGH (ref 0.61–1.24)
GFR, Estimated: 51 mL/min — ABNORMAL LOW (ref >60)
Glucose, Bld: 96 mg/dL (ref 70–99)
Potassium: 4.8 mmol/L (ref 3.5–5.1)
Sodium: 137 mmol/L (ref 135–145)
Total Bilirubin: 0.9 mg/dL (ref 0.3–1.2)
Total Protein: 9.3 g/dL — ABNORMAL HIGH (ref 6.5–8.1)

## 2023-06-26 LAB — BRAIN NATRIURETIC PEPTIDE: B Natriuretic Peptide: 63.7 pg/mL (ref 0.0–100.0)

## 2023-06-26 LAB — DIGOXIN LEVEL: Digoxin Level: 0.4 ng/mL — ABNORMAL LOW (ref 0.8–2.0)

## 2023-06-26 LAB — TSH: TSH: 5.573 u[IU]/mL — ABNORMAL HIGH (ref 0.350–4.500)

## 2023-06-26 LAB — MAGNESIUM: Magnesium: 2.1 mg/dL (ref 1.7–2.4)

## 2023-06-26 MED ORDER — SPIRONOLACTONE 25 MG PO TABS
12.5000 mg | ORAL_TABLET | Freq: Every day | ORAL | 3 refills | Status: DC
Start: 2023-06-26 — End: 2023-11-07

## 2023-06-26 MED ORDER — DAPAGLIFLOZIN PROPANEDIOL 10 MG PO TABS: 10.0000 mg | ORAL_TABLET | Freq: Every day | ORAL | 11 refills | Status: AC

## 2023-06-26 MED ORDER — TORSEMIDE 20 MG PO TABS
ORAL_TABLET | ORAL | 6 refills | Status: DC
Start: 2023-06-26 — End: 2023-07-13

## 2023-06-26 NOTE — Patient Instructions (Addendum)
TAKE 1 Potassium tablet daily   CHANGE Torsemide to 60 mg ( 3 Tab) in the morning and 40 mg ( 2 Tab) in the evening.  START Spironolactone 1/2 Tab Daily.  START Farxiga 10 mg ( 1 Tab ) daily.  Labs done today, your results will be available in MyChart, we will contact you for abnormal readings.   Repeat blood work in 7 days  Your physician recommends that you schedule a follow-up appointment in: 2 weeks  If you have any questions or concerns before your next appointment please send [place] a message through [place] or call our office at [phone].    TO LEAVE A MESSAGE FOR THE NURSE SELECT OPTION 2, PLEASE LEAVE A MESSAGE INCLUDING: YOUR NAME DATE OF BIRTH CALL BACK NUMBER REASON FOR CALL**this is important as we prioritize the call backs  YOU WILL RECEIVE A CALL BACK THE SAME DAY AS LONG AS YOU CALL BEFORE 4:00 PM  At the [facility], you and your health needs are our priority. As part of our continuing mission to provide you with exceptional heart care, we have created designated Provider Care Teams. These Care Teams include your primary Cardiologist (physician) and Advanced Practice Providers (APPs- Physician Assistants and Nurse Practitioners) who all work together to provide you with the care you need, when you need it.   You may see any of the following providers on your designated Care Team at your next follow up: Dr Arvilla Meres Dr Marca Ancona Dr. Marcos Eke, NP Robbie Lis, [place] [facility] [place], [place] Brynda Peon, NP Karle Plumber, PharmD   Please be sure to bring in all your medications bottles to every appointment.    Thank you for choosing [facility]

## 2023-06-27 NOTE — Progress Notes (Signed)
PCP: Merri Brunette, MD Cardiology: Dr. Jens Som HF Cardiology: Dr. Shirlee Latch  82 y.o. with history of permanent atrial fibrillation, CAD s/p CABG, St Jude PPM, pulmonary fibrosis, and chronic RV failure with severe TR was referred by Dr. Marchelle Gearing for evaluation of CHF/RV failure. Patient had CABG and Maze in 1/09.  In 8/13, he had a subdural hematoma while on warfarin, craniotomy done.  He has been off anticoagulation since that time.  His atrial fibrillation is now permanent.  He has a Secondary school teacher PPM for tachy/brady syndrome.  He is followed by Dr. Marchelle Gearing for interstitial lung disease, CT chest consistent with UIP.  He is not on anti-fibrotics.   Echoes going back to 2020 have shown RV dysfunction with severe TR. Most recent echo in 3/24 showed EF 55-60%, D-shaped septum with moderate RV enlargement and moderate RV dysfunction, severe TR, dilated IVC.  RHC was done in 6/24, showing mean RA 22 with prominent CV waves, PA 44/22 mean 33, mean PCWP 19, CI 2.29, PVR 3.1 WU, PAPi 1 => predominantly pulmonary venous hypertension with significant RV dysfunction.   Torsemide was increased to 40 mg bid.   Patient is here with his wife.  He has mild dementia but gives most of history with some help from his wife. Edema has gone down some with increased torsemide.  Appetite remains poor. He is short of breath walking up stairs or inclines.  Generally does ok walking around his house.  No falls.  No syncope/lightheadedness.  No chest pain.  Sometimes he feels palpitations but not bothered much by them.   ECG (personally reviewed): Atrial fibrillation, RBBB  Labs (5/24): K 3.6, creatinine 1.37  PMH: 1. Atrial fibrillation: Maze in 1/09 with CABG.  AF now permanent. Not anticoagulated since subdural hemorrhage.  2. Subdural hematoma: 8/13, patient was on warfarin at the time.  Had craniotomy.  He has been off anticoagulation since then.  3. CAD: s/p CABG 1/09 with LIMA-LAD, SVG-OM2, SVG-PDA.  4. Interstitial lung  disease: Pulmonary fibrosis, UIP pattern.  Sees Dr. Marchelle Gearing.  - CT chest (3/24): Fibrotic ILD.  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 DM2 9. HTN 10. Hyperlipidemia 11. Testicular cancer: s/p orchiectomy 12. OSA: CPAP 13. 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  - Echo (11/22): severe TR, RV dysfunction.  - Echo (3/24): EF 55-60%, D-shaped septum with moderate RV enlargement and moderate RV dysfunction, severe TR, dilated IVC.  - RHC (6/24): mean RA 22 with prominent CV waves, PA 44/22 mean 33, mean PCWP 19, CI 2.29, PVR 3.1 WU, PAPi 1 => predominantly pulmonary venous hypertension with significant RV dysfunction.   Family History  Problem Relation Age of Onset   Heart disease Father    Heart attack Father    Heart failure Father    Heart disease Mother    Alzheimer's disease Mother    Dementia Mother    Social History   Socioeconomic History   Marital status: Married    Spouse name: Engineer, maintenance (IT)   Number of children: 2   Years of education: Not on file   Highest education level: Not on file  Occupational History   Occupation: Retired- Theatre stage manager  Tobacco Use   Smoking status: Former    Packs/day: 2.00    Years: 20.00    Additional pack years: 0.00    Total pack years: 40.00    Types: Cigarettes  Quit date: 02/21/1991    Years since quitting: 32.3   Smokeless tobacco: Never  Vaping Use   Vaping Use: Never used  Substance and Sexual Activity   Alcohol use: No    Alcohol/week: 0.0 standard drinks of alcohol   Drug use: No   Sexual activity: Not Currently  Other Topics Concern   Not on file  Social History Narrative   Lives with wife   Right handed    Married with two children.     He is a Emergency planning/management officer.     Social Determinants of Health   Financial Resource Strain: Not on file  Food Insecurity: Not on file  Transportation Needs: Not on file  Physical Activity:  Not on file  Stress: Not on file  Social Connections: Not on file  Intimate Partner Violence: Not on file   ROS: All systems reviewed and negative except as per HPI.   Current Outpatient Medications  Medication Sig Dispense Refill   allopurinol (ZYLOPRIM) 300 MG tablet Take 1 tablet (300 mg total) by mouth daily. 30 tablet 0   colchicine 0.6 MG tablet TAKE 1 TO 3 TABLETS AS NEEDED DAILY     dapagliflozin propanediol (FARXIGA) 10 MG TABS tablet Take 1 tablet (10 mg total) by mouth daily before breakfast. 30 tablet 11   ezetimibe (ZETIA) 10 MG tablet Take 1 tablet (10 mg total) by mouth daily. 90 tablet 3   ferrous sulfate 325 (65 FE) MG tablet Take 1 tablet (325 mg total) by mouth daily with breakfast. 30 tablet 3   memantine (NAMENDA) 10 MG tablet Take 10 mg by mouth 2 (two) times daily.     metoprolol succinate (TOPROL-XL) 25 MG 24 hr tablet Take 25 mg by mouth daily.     mirabegron ER (MYRBETRIQ) 50 MG TB24 tablet Take 1 tablet (50 mg total) by mouth daily. 30 tablet 3   modafinil (PROVIGIL) 100 MG tablet TAKE 1 TABLET BY MOUTH EVERY DAY 30 tablet 5   Multiple Vitamin (MULTIVITAMIN WITH MINERALS) TABS tablet Take 1 tablet by mouth daily.     NON FORMULARY Pt uses a cpap nightly     pantoprazole (PROTONIX) 40 MG tablet Take 1 tablet (40 mg total) by mouth daily. 30 tablet 0   rosuvastatin (CRESTOR) 40 MG tablet Take 1 tablet (40 mg total) by mouth daily. 90 tablet 2   sertraline (ZOLOFT) 100 MG tablet Take 2 tablets (200 mg total) by mouth daily. (Patient taking differently: Take 100 mg by mouth daily. 1.5 tablets daily) 30 tablet 0   silver sulfADIAZINE (SILVADENE) 1 % cream Apply 1 Application topically daily.     silver sulfADIAZINE (SILVADENE) 1 % cream Apply 1 Application topically daily. 50 g 0   spironolactone (ALDACTONE) 25 MG tablet Take 0.5 tablets (12.5 mg total) by mouth daily. 45 tablet 3   tamsulosin (FLOMAX) 0.4 MG CAPS capsule Take 1 capsule (0.4 mg total) by mouth at  bedtime. 30 capsule 1   Teriparatide, Recombinant, 620 MCG/2.48ML SOPN inject 20 mcg Subcutaneous Once a day (Patient taking differently: Takes injection twice a year.) 1 mL 11   digoxin (LANOXIN) 0.125 MG tablet TAKE ONE TABLET BY MOUTH MONDAY THROUGH FRIDAY. DO NOT TAKE ON SATURDAY OR SUNDAY. (Patient taking differently: Take 0.125 mg by mouth See admin instructions. Take 0.125 mg by mouth Monday - Saturday, skipping Sunday.) 90 tablet 3   potassium chloride SA (KLOR-CON M) 20 MEQ tablet Take 1 tablet (20 mEq total) by mouth daily. (  Patient not taking: Reported on 06/26/2023) 90 tablet 3   torsemide (DEMADEX) 20 MG tablet Take 3 tablets (60 mg total) by mouth every morning AND 2 tablets (40 mg total) every evening. 200 tablet 6   No current facility-administered medications for this encounter.   BP 116/72   Pulse 98   Wt 81.4 kg (179 lb 6.4 oz)   SpO2 99%   BMI 27.28 kg/m  General: NAD Neck: JVP 12 cm, no thyromegaly or thyroid nodule.  Lungs: Clear to auscultation bilaterally with normal respiratory effort. CV: Nondisplaced PMI.  Heart irregular S1/S2, no S3/S4, 2/6 HSM LLSB.  1+ edema 1/2 to knees with chronic venous stasis changes.  No carotid bruit.  Difficult to palpate pedal pulses.  Abdomen: Soft, nontender, no hepatosplenomegaly, no distention.  Skin: Intact without lesions or rashes.  Neurologic: Alert and oriented x 3.  Psych: Normal affect. Extremities: No clubbing or cyanosis.  HEENT: Normal.   Assessment/Plan: 1. Chronic diastolic CHF: With prominent RV failure and severe TR.  Echoes since 2020 have shown RV failure and severe TR.  Most recent echo in 3/24 showed EF 55-60%, D-shaped septum with moderate RV enlargement and moderate RV dysfunction, severe TR, dilated IVC.  RHC was done in 6/24, showing mean RA 22 with prominent CV waves, PA 44/22 mean 33, mean PCWP 19, CI 2.29, PVR 3.1 WU, PAPi 1 => predominantly pulmonary venous hypertension with significant RV dysfunction.  He  remains volume overloaded on exam today though he thinks that increasing torsemide has helped.  - Increase torsemide to 60 qam/40 qpm.  - Add spironolactone 12.5 daily . - Add Farxiga 10 mg daily.  - BMET/BNP today, BMET in 10 days.  - He has been on digoxin for RV support, will continue.  Check digoxin level today.  2. 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  Cause uncertain, could be atrial functional TR with long-standing atrial fibrillation.  Suspect this has significantly worsened his RV failure.   - With his age, frailty, and dementia, I do not think there are options to surgically or percutaneously treat his TR.  - Focus on decongestion.  3. Pulmonary hypertension: Mild on RHC, predominantly pulmonary venous hypertension due to LV diastolic dysfunction.  - Treatment will be diuresis.  4. Interstitial lung disease: UIP, followed by Dr. Marchelle Gearing.  He is not on anti-fibrotics.  With pulmonary venous hypertension, not a candidate for Tyvaso.  5. Atrial fibrillation: Permanent.  Rate is controlled.  - Continue Toprol XL 25 daily and digoxin.  - He has been deemed not a candidate for anticoagulation after subdural hemorrhage in the past.  6. 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   7. CAD: s/p CABG 1/09.  No chest pain.  - Continue Crestor and Zetia.  8. OSA: Continue CPAP.   Followup 2 wks with APP.   [name] 06/27/2023

## 2023-06-28 ENCOUNTER — Ambulatory Visit
Admission: RE | Admit: 2023-06-28 | Discharge: 2023-06-28 | Disposition: A | Discharge status: Home / Self Care | Payer: Medicare Other | Source: Ambulatory Visit | Attending: Gastroenterology | Admitting: Gastroenterology

## 2023-06-28 DIAGNOSIS — K7460 Unspecified cirrhosis of liver: Secondary | ICD-10-CM | POA: Diagnosis not present

## 2023-06-28 DIAGNOSIS — N261 Atrophy of kidney (terminal): Secondary | ICD-10-CM | POA: Diagnosis not present

## 2023-06-30 ENCOUNTER — Other Ambulatory Visit: Payer: Self-pay | Admitting: Podiatry

## 2023-07-03 ENCOUNTER — Other Ambulatory Visit (HOSPITAL_COMMUNITY): Payer: Medicare Other

## 2023-07-09 ENCOUNTER — Other Ambulatory Visit: Payer: Self-pay | Admitting: Adult Health

## 2023-07-10 ENCOUNTER — Encounter: Payer: Self-pay | Admitting: Internal Medicine

## 2023-07-10 ENCOUNTER — Ambulatory Visit (INDEPENDENT_AMBULATORY_CARE_PROVIDER_SITE_OTHER): Payer: Medicare Other | Admitting: Internal Medicine

## 2023-07-10 ENCOUNTER — Telehealth: Payer: Self-pay | Admitting: Internal Medicine

## 2023-07-10 VITALS — BP 102/66 | HR 76 | Ht 68.0 in | Wt 182.8 lb

## 2023-07-10 DIAGNOSIS — Z5181 Encounter for therapeutic drug level monitoring: Secondary | ICD-10-CM

## 2023-07-10 DIAGNOSIS — G4733 Obstructive sleep apnea (adult) (pediatric): Secondary | ICD-10-CM | POA: Diagnosis not present

## 2023-07-10 DIAGNOSIS — J84112 Idiopathic pulmonary fibrosis: Secondary | ICD-10-CM | POA: Diagnosis not present

## 2023-07-10 NOTE — Telephone Encounter (Signed)
Rx team we are starting esbriet low dose protocol. Please do so

## 2023-07-10 NOTE — Telephone Encounter (Signed)
Per Lajoyce Corners, CMA, patient took home Esbriet [employer] PAP  Chesley Mires, PharmD, MPH, BCPS, CPP Clinical Pharmacist (Rheumatology and Pulmonology)

## 2023-07-10 NOTE — Progress Notes (Addendum)
OV with Dr Tonia Brooms - [name] 2024:   This is an 82 year old gentleman, past medical history of heart failure with preserved ejection fraction, atrial fibrillation, CAD, diabetes, hyperlipidemia, hypertension, [name], OSA, pacemaker placement history of a subdural hematoma.Several years ago he had CT axial imaging of the chest concerning for UIP pattern.  He had pulmonary function tests in 2020.  He had an autoimmune workup in 2014 that was negative.  He had a TLC of 63% in 2014 and a reduced DLCO.  He had progression from 20 14-20 20.  He has not been on any antifibrotic's now presents with shortness of breath with exertion and cough.    OV 03/20/2023  Subjective:  Patient ID: [name]., male , DOB: [birth year] , age 82 y.o. , MRN: [id number] , ADDRESS: [address] [name] [address] PCP Merri Brunette, MD Patient Care Team: Merri Brunette, MD as PCP - General Jens Som Madolyn Frieze, MD as PCP - Cardiology (Cardiology) Jens Som Madolyn Frieze, MD as Consulting Physician (Cardiology) Irena Reichmann, DO as Referring Physician (Family Medicine)  This Provider for this visit: Treatment Team:  Attending Provider: Kalman Shan, MD    03/20/2023 -   Chief Complaint  Patient presents with   Consult    Pulm. fibrosis.     HPI Armarion Chanc Kervin. 82 y.o. -   HRCT 3 OV 04/17/2023  Subjective:  Patient ID: [name]., male , DOB: 08-31-1941 , age 82 y.o. , MRN: [id number] , ADDRESS: [address] [name] [address] PCP Merri Brunette, MD Patient Care Team: Merri Brunette, MD as PCP - General Jens Som Madolyn Frieze, MD as PCP - Cardiology (Cardiology) Jens Som Madolyn Frieze, MD as Consulting Physician (Cardiology) Irena Reichmann, DO as Referring Physician (Family Medicine)  This Provider for this visit: Treatment Team:  Attending Provider: Kalman Shan, MD    04/17/2023 -   Chief Complaint  Patient presents with   Follow-up    F/up on ILD     HPI Jamie G  Koors [name]. 82 y.o. -presents for ILD follow-up.  He brought his ILD questionnaire with him.  I made note of this for his 03/20/23 visit above.  Based on his answers, age greater than 90, progression of the CT scan and today on the pulmonary function test as well in the last 4 years setting the diagnosis IPF.  I gave this diagnosis to him and his wife.  His wife is the main historian here and she is an independent historian.  He has mild dementia.  But he does listen and he seems to have capacity to understand what is going on.  Did indicate to the wife serologies negative.  After the last visit I recommended right heart catheterization.  Dr. Ludwig Clarks office did try to reach him but could not get hold of him.  Subsequent to that he saw the PA and I am unclear as to what the right heart cath plans are.  It appears that more optimization of the left heart disease was done because his BNP was very high.  I also recommended a overnight pulse oximetry on room air while he uses his CPAP [he uses CPAP on room air] but this has not been done.  I have indicated to the wife we will reorder this.   Lab results do show he also has cirrhosis.  I had a detailed discussion with him and his wife about antifibrotic's.  Definitely do not recommend nintedanib is the  first choice.  He is intolerant to anticoagulants and he also has cardiac disease.  Did bring up the option of pirfenidone but overall in balance he has a lot of fatigue and a lot of side effects and his pulm polypharmacy.  We took a shared decision making that we would pursue right heart catheterization first and try to optimize his heart failure/pulmonary hypertension.  Did discuss inhaled treprostinil as an option to treat his WHO group 3 pulmonary hypertension if it exists.  Therefore we took a shared decision making to proceed with right heart cath.  I have informed his cardiologist again.    HRCT 03/06/23   Narrative & Impression  CLINICAL DATA:   Interstitial lung disease.   EXAM: CT CHEST WITHOUT CONTRAST   TECHNIQUE: Multidetector CT imaging of the chest was performed following the standard protocol without intravenous contrast. High resolution imaging of the lungs, as well as inspiratory and expiratory imaging, was performed.   RADIATION DOSE REDUCTION: This exam was performed according to the departmental dose-optimization program which includes automated exposure control, adjustment of the mA and/or kV according to patient size and/or use of iterative reconstruction technique.   COMPARISON:  08/03/2021.   FINDINGS: Cardiovascular: Atherosclerotic calcification of the aorta and aortic valve. Enlarged pulmonic trunk and heart. No pericardial effusion.   Mediastinum/Nodes: Mediastinal lymph nodes measure up to 10 mm in the high right paratracheal station, as before. Hilar regions are difficult to evaluate without IV contrast. No axillary adenopathy. Esophagus is grossly unremarkable.   Lungs/Pleura: Interval progression in peripheral and basilar predominant subpleural reticulation, ground-glass and traction bronchiectasis/bronchiolectasis. Interstitial thickening/coarsening in the lung bases. No definitive honeycombing. Calcified granulomas. Scattered fissural thickening bilaterally. No pleural fluid. No air trapping.   Upper Abdomen: Liver margin is irregular. Trace perihepatic ascites. Cholecystectomy. Visualized portions of the adrenal glands and right kidney are unremarkable. Hypodense and hyperdense lesions in the left kidney. No specific follow-up necessary. Spleen may be enlarged but is incompletely imaged. Visualized portions of the pancreas, stomach and bowel are grossly unremarkable. Ventral hernia repair. No upper abdominal adenopathy.   Musculoskeletal: Degenerative changes in the spine. No worrisome lytic or sclerotic lesions.   IMPRESSION: 1. Interval progression in fibrotic interstitial lung  disease, detailed above. Patient reportedly has a known diagnosis of idiopathic pulmonary fibrosis, as indicated in the prior CT chest report. 2. Cirrhosis. 3. Trace perihepatic ascites. 4.  Aortic atherosclerosis (ICD10-I70.0). 5. Enlarged pulmonic trunk, indicative of pulmonary arterial hypertension.     Electronically Signed   By: Leanna Battles M.D.   On: 03/07/2023 16:17      ECHO 03/13/23   IMPRESSIONS     1. Left ventricular ejection fraction, by estimation, is 55 to 60%. The  left ventricle has normal function. The left ventricle has no regional  wall motion abnormalities. Left ventricular diastolic parameters are  indeterminate. There is the  interventricular septum is flattened in diastole ('D' shaped left  ventricle), consistent with right ventricular volume overload.   2. Right ventricular systolic function is moderately reduced. The right  ventricular size is moderately enlarged. PASP likely underestimated due to  severity of TR.   3. Left atrial size was mildly dilated.   4. Right atrial size was moderately dilated.   5. The mitral valve is grossly normal. Trivial mitral valve  regurgitation.   6. Tricuspid valve regurgitation is severe.   7. The aortic valve is tricuspid. Aortic valve regurgitation is not  visualized. Aortic valve sclerosis/calcification is present, without any  evidence of aortic stenosis.   8. Aortic dilatation noted. There is borderline dilatation of the  ascending aorta, measuring 38 mm.   9. The inferior vena cava is dilated in size with <50% respiratory  variability, suggesting right atrial pressure of 15 mmHg.   Comparison(s): No significant change from prior TTE. There continues to be  severe TR with moderate RV dysfunction and enlargement. PASP likely  underestimated in the setting of severe TR.    OV 07/10/2023  Subjective:  Patient ID: [name]., male , DOB: [birth year] , age 53 y.o. , MRN: [id number] , ADDRESS: [address] [name] [address] PCP Merri Brunette, MD Patient Care Team: Merri Brunette, MD as PCP - General Jens Som Madolyn Frieze, MD as PCP - Cardiology (Cardiology) Jens Som Madolyn Frieze, MD as Consulting Physician (Cardiology) Irena Reichmann, DO as Referring Physician (Family Medicine)  This Provider for this visit: Treatment Team:  Attending Provider: Kalman Shan, MD    07/10/2023 -   Chief Complaint  Patient presents with   Follow-up    !2 week f/up no complaints.  #IPF given diagnosis in April 2024 [progressive phenotype between 2020 and 2024] #Multiple other comorbidities including CKD with a GFR 50 18 June 2023/creatinine 1.4 #Right heart catheterization pulmonary venous hypertension June 2024   HPI Jentry Judd Gaudier. 82 y.o. -returns for follow-up.  Presents with his wife.  Wife is independent historian.  He is mostly quiet.  He has a cane.  But he is understanding the conversation.  He had his right heart catheterization.  Results are below.  I personally reviewed it.  He has pulmonary venous hypertension.  I reviewed Medical records indicate they have started increasing his torsemide.  No difference in shortness of breath but his pedal edema is better.  I had pulmonary function test today and I given that is a little stable compared to April 2024 but overall progressive.  Again mentioned IPF and progressive nature of this disease.  Other than the above in the interim Interim Health status: No new complaints No new medical problems. No new surgeries. No ER visits. No Urgent care visits. No changes to medications   We discussed antifibrotic therapy.  Also discussed clinical trials [see below].  At this point in time first-line is to do antifibrotic therapy.  Previously recommended against nintedanib because of cardiac issues and anticoagulation.  Discussed pirfenidone.  Discussed that we will do the low-dose protocol.  I have sent a message to our pharmacy team about this.  Pirfenidone is approved drug in the [place] since Oct 2014 but approved prior  in [place], [place], [place], and [place] for treatment of IPF.  Is a high risk prescription that requires frequent therapeutic monitoring.  BASICS  Believed to function as a general inhibitor of fibrosis and inflammation Exact mechanism of action unknown Functions by preventing worsening of IPF. Does not reverse process Studied only in IPF (not studied in other causes of UIP like autoimmune or asbestosis) Studied only in mild to moderate severity of IPF (DLCO > 30% and FVC > 50%) Marketed by company called  [employer], out of [place], [place]  1  [place] study and 2 [place]/European studies called CAPACITY and 1 International trial called ASCEND Most recent result was ASCEND, results released Feb 2014 with positive results Currently FDA reviewing whether to approve drug or not. Currently in [place], drug available in > 80 centers through expanded accesss program (safety study and compassionate use): WE ARE one of the  sites  RESULTS  Proportion of patients with > 10% decline in FVC (meaningful parameter) is around 1/3 less with those taking pirfenidone at 52 weeks. Basically prevents decline in IPF  This has  translated into potentially reduced mortality  (pooled analysis but not powered for it) and reduced hospitalization   DOSING Dose is 2403 mg per day in 3 divided doses and gradually escalated to this dose over 15 days Take with food  SIDE EFFECTS Generally well tolerated. In ASCEND the DC RATE was 14% but this is only 4% more than placebo GI dyspepsia most common but still tolerated . TAKE With FOOD Skin irritant - So, wear high SPF (>50) sun-screen, wear hat & full length clothes   - 1:100 to 1:1000 chance of angioedema in first 90 days  - rare side effect called agranulocytosis  CONTRAINDICATIONS Severe Hepatic impairment or ESLD (> 5 times ULN) CKD with Cr CL < 30 and ESRD QTc b> (? Relative v  absolute) Pregnancy Concomitant smoking   DRUG INTERACTIONS Pirfenidone is metabolized through CYP1A2. So caution when there is concomitant use of a CYP enzyme inhibitor No concomitant FLUVOXAMINE or SMOKING Avoid grape juice Caution when using amiodarone, ciproflox, SSRI,      SYMPTOM SCALE - ILD 04/17/2023 07/10/2023   Current weight    O2 use 0   Shortness of Breath 0 -> 5 scale with 5 being worst (score 6 If unable to do)   At rest 0 1  Simple tasks - showers, clothes change, eating, shaving 3 2  Household (dishes, doing bed, laundry) 4 2  Shopping 3 3  Walking level at own pace 3 2  Walking up Stairs 5 5  Total (30-36) Dyspnea Score 28 15      Non-dyspnea symptoms (0-> 5 scale) 04/17/2023 07/10/2023   How bad is your cough? 2 3  How bad is your fatigue 4 4  How bad is nausea 1 0  How bad is vomiting?  1 0  How bad is diarrhea? 2 0  How bad is anxiety? 1 1  How bad is depression 1 1  Any chronic pain - if so where and how bad 3     RHC June 2024  1.  Severe elevation in right heart filling pressures with prominent V-waves in RA pressure tracing suggesting significant TR.  2.  Mild pulmonary hypertension, suspect predominantly pulmonary venous hypertension.  3.  Mildly elevated PCWP.  4.  Mildly decreased cardiac output.  5.  RV dysfunction as evidenced by low PAPi.    Increase torsemide to 40 mg bid and add KCl 20 daily.  Will arrange CHF clinic followup    PFT     Latest Ref Rng & Units 04/04/2023    3:55 PM 10/28/2019   11:12 AM  ILD indicators  FVC-Pre L 2.44  3.05   FVC-Predicted Pre % 69  82   FVC-Post L 2.42  2.96   FVC-Predicted Post % 68  80   TLC L 4.14  5.27   TLC Predicted % 64  81   DLCO uncorrected ml/min/mmHg 8.67  15.51   DLCO UNC %Pred % 39  68   DLCO Corrected ml/min/mmHg 9.68  19.26   DLCO COR %Pred % 43  84       LAB RESULTS last 96 hours No results found.  LAB RESULTS last 90 days Recent Results (from the past 2160  hour(s))  Multiple Myeloma Panel (SPEP&IFE w/QIG)     Status:  Abnormal   Collection Time: 05/17/23  9:48 AM  Result Value Ref Range   IgG (Immunoglobin G), Serum 1,804 (H) 603 - 1,613 mg/dL   IgA 629 (H) 61 - 528 mg/dL   IgM (Immunoglobulin M), Srm 107 15 - 143 mg/dL   Total Protein ELP 7.3 6.0 - 8.5 g/dL   Albumin SerPl Elph-Mcnc 3.3 2.9 - 4.4 g/dL   Alpha 1 0.3 0.0 - 0.4 g/dL   Alpha2 Glob SerPl Elph-Mcnc 0.7 0.4 - 1.0 g/dL   B-Globulin SerPl Elph-Mcnc 1.3 0.7 - 1.3 g/dL   Gamma Glob SerPl Elph-Mcnc 1.8 0.4 - 1.8 g/dL   M Protein SerPl Elph-Mcnc 0.4 (H) Not Observed g/dL   Globulin, Total 4.0 (H) 2.2 - 3.9 g/dL   Albumin/Glob SerPl 0.9 0.7 - 1.7   IFE 1 Comment (A)     Comment: (NOTE) Immunofixation shows IgA monoclonal protein with kappa light chain specificity. Polyclonal increase detected in one or more immunoglobulins.    Please Note Comment     Comment: (NOTE) Protein electrophoresis scan will follow via computer, mail, or courier delivery. Performed At: [facility] [address] [place], [place] [id number] Jolene Schimke MD UV:[id number]   Kappa/lambda light chains     Status: Abnormal   Collection Time: 05/17/23  9:48 AM  Result Value Ref Range   Kappa free light chain 107.6 (H) 3.3 - 19.4 mg/L   Lambda free light chains 36.7 (H) 5.7 - 26.3 mg/L   Kappa, lambda light chain ratio 2.93 (H) 0.26 - 1.65    Comment: (NOTE) Performed At: [facility] [address] [place], [place] [id number] Jolene Schimke MD VF:[id number]   Vitamin B12     Status: None   Collection Time: 05/17/23  9:48 AM  Result Value Ref Range   Vitamin B-12 439 180 - 914 pg/mL    Comment: (NOTE) This assay is not validated for testing neonatal or myeloproliferative syndrome specimens for Vitamin B12 levels. Performed at [facility], [address] [place], [place] [address]   Ferritin     Status: None   Collection Time: 05/17/23  9:48 AM  Result Value  Ref Range   Ferritin 146 24 - 336 ng/mL    Comment: Performed at [facility], [address] [place], [place] [address]  Iron and Iron Binding Capacity ([facility] only)     Status: Abnormal   Collection Time: 05/17/23  9:48 AM  Result Value Ref Range   Iron 41 (L) 45 - 182 ug/dL   TIBC 010 932 - 355 ug/dL   Saturation Ratios 14 (L) 17.9 - 39.5 %   UIBC 256 117 - 376 ug/dL    Comment: Performed at [facility], [address] [place], [place] [address]  CBC with Differential (Cancer Center Only)     Status: Abnormal   Collection Time: 05/17/23  9:48 AM  Result Value Ref Range   WBC Count 3.2 (L) 4.0 - 10.5 K/uL   RBC 3.82 (L) 4.22 - 5.81 MIL/uL   Hemoglobin 12.2 (L) 13.0 - 17.0 g/dL   HCT 25.4 (L) 27.0 - 62.3 %   MCV 94.2 80.0 - 100.0 fL   MCH 31.9 26.0 - 34.0 pg   MCHC 33.9 30.0 - 36.0 g/dL   RDW 76.2 83.1 - 51.7 %   Platelet Count 74 (L) 150 - 400 K/uL   nRBC 0.0 0.0 - 0.2 %   Neutrophils Relative % 68 %   Neutro  Abs 2.2 1.7 - 7.7 K/uL   Lymphocytes Relative 21 %   Lymphs Abs 0.7 0.7 - 4.0 K/uL   Monocytes Relative 6 %   Monocytes Absolute 0.2 0.1 - 1.0 K/uL   Eosinophils Relative 4 %   Eosinophils Absolute 0.1 0.0 - 0.5 K/uL   Basophils Relative 1 %   Basophils Absolute 0.0 0.0 - 0.1 K/uL   Immature Granulocytes 0 %   Abs Immature Granulocytes 0.01 0.00 - 0.07 K/uL    Comment: Performed at [facility], [address], [place], [place] [address]  CMP (Cancer Center only)     Status: Abnormal   Collection Time: 05/17/23  9:48 AM  Result Value Ref Range   Sodium 141 135 - 145 mmol/L   Potassium 3.6 3.5 - 5.1 mmol/L   Chloride 106 98 - 111 mmol/L   CO2 30 22 - 32 mmol/L   Glucose, Bld 82 70 - 99 mg/dL    Comment: Glucose reference range applies only to samples taken after fasting for at least 8 hours.   BUN 27 (H) 8 - 23 mg/dL   Creatinine 1.91 (H) 4.78 - 1.24 mg/dL   Calcium 9.0 8.9 - 29.5 mg/dL    Total Protein 8.1 6.5 - 8.1 g/dL   Albumin 4.0 3.5 - 5.0 g/dL   AST 29 15 - 41 U/L   ALT 14 0 - 44 U/L   Alkaline Phosphatase 88 38 - 126 U/L   Total Bilirubin 0.9 0.3 - 1.2 mg/dL   GFR, Estimated 52 (L) >60 mL/min    Comment: (NOTE) Calculated using the CKD-EPI Creatinine Equation (2021)    Anion gap 5 5 - 15    Comment: Performed at [facility], [address] [place], [place] [address]  POCT I-Stat EG7     Status: Abnormal   Collection Time: 05/22/23 11:25 AM  Result Value Ref Range   pH, Ven 7.360 7.25 - 7.43   pCO2, Ven 43.1 (L) 44 - 60 mmHg   pO2, Ven 34 32 - 45 mmHg   Bicarbonate 24.3 20.0 - 28.0 mmol/L   TCO2 26 22 - 32 mmol/L   O2 Saturation 64 %   Acid-base deficit 1.0 0.0 - 2.0 mmol/L   Sodium 143 135 - 145 mmol/L   Potassium 3.9 3.5 - 5.1 mmol/L   Calcium, Ion 1.12 (L) 1.15 - 1.40 mmol/L   HCT 31.0 (L) 39.0 - 52.0 %   Hemoglobin 10.5 (L) 13.0 - 17.0 g/dL   Sample type VENOUS    Comment NOTIFIED PHYSICIAN   POCT I-Stat EG7     Status: Abnormal   Collection Time: 05/22/23 11:25 AM  Result Value Ref Range   pH, Ven 7.357 7.25 - 7.43   pCO2, Ven 43.1 (L) 44 - 60 mmHg   pO2, Ven 33 32 - 45 mmHg   Bicarbonate 24.2 20.0 - 28.0 mmol/L   TCO2 25 22 - 32 mmol/L   O2 Saturation 62 %   Acid-base deficit 1.0 0.0 - 2.0 mmol/L   Sodium 143 135 - 145 mmol/L   Potassium 4.0 3.5 - 5.1 mmol/L   Calcium, Ion 1.13 (L) 1.15 - 1.40 mmol/L   HCT 32.0 (L) 39.0 - 52.0 %   Hemoglobin 10.9 (L) 13.0 - 17.0 g/dL   Sample type VENOUS    Comment NOTIFIED PHYSICIAN   CUP PACEART REMOTE DEVICE CHECK     Status: None   Collection Time: 06/19/23  4:28  AM  Result Value Ref Range   Date Time Interrogation Session [phone]) [phone]    Pulse Generator Manufacturer [employer]    Pulse Gen Model 2272 Assurity MRI    Pulse Gen Serial Number [id number]    Clinic Name [facility]    Implantable Pulse Generator Type Implantable Pulse Generator    Implantable Pulse Generator  Implant Date [id number]    Implantable Lead Manufacturer [facility]    Implantable Lead Model 1642T IsoFlex S    Implantable Lead Serial Number [id number]    Implantable Lead Implant Date 24401027    Implantable Lead Location Detail 1 APPENDAGE    Implantable Lead Location [id number]    Implantable Lead Connection Status [id number]    Implantable Lead Manufacturer [facility]    Implantable Lead Model 1646T IsoFlex S    Implantable Lead Serial Number [id number]    Implantable Lead Implant Date 25366440    Implantable Lead Location Detail 1 [employer]PE[employer]    Implantable Lead Location [id number]    Implantable Lead Connection Status [id number]    Lead Channel Setting Sensing Sensitivity 2.0 mV   Lead Channel Setting Sensing Adaptation Mode Fixed Pacing    Lead Channel Setting Pacing Amplitude 2.0 V   Lead Channel Setting Pacing Pulse Width 0.4 ms   Lead Channel Setting Pacing Amplitude 2.5 V   Lead Channel Status NULL    Lead Channel Impedance Value 460 ohm   Lead Channel Sensing Intrinsic Amplitude 2.2 mV   Lead Channel Pacing Threshold Amplitude 1.0 V   Lead Channel Pacing Threshold Pulse Width 0.4 ms   Lead Channel Status NULL    Lead Channel Impedance Value 460 ohm   Lead Channel Sensing Intrinsic Amplitude 6.7 mV   Lead Channel Pacing Threshold Amplitude 0.75 V   Lead Channel Pacing Threshold Pulse Width 0.4 ms   Battery Status MOS    Battery Remaining Longevity 77 mo   Battery Remaining Percentage 64.0 %   Battery Voltage 2.99 V   Brady Statistic RA Percent Paced 4.8 %   Brady Statistic RV Percent Paced 6.2 %   Brady Statistic AP VP Percent 25.0 %   Brady Statistic AS VP Percent 4.2 %   Brady Statistic AP VS Percent 11.0 %   Brady Statistic AS VS Percent 46.0 %  Comprehensive Metabolic Panel (CMET)     Status: Abnormal   Collection Time: 06/26/23  3:54 PM  Result Value Ref Range   Sodium 137 135 - 145 mmol/L   Potassium 4.8 3.5 - 5.1 mmol/L   Chloride 103 98 - 111 mmol/L   CO2 26 22 - 32 mmol/L   Glucose, Bld  96 70 - 99 mg/dL    Comment: Glucose reference range applies only to samples taken after fasting for at least 8 hours.   BUN 24 (H) 8 - 23 mg/dL   Creatinine, Ser 3.47 (H) 0.61 - 1.24 mg/dL   Calcium 9.7 8.9 - 42.5 mg/dL   Total Protein 9.3 (H) 6.5 - 8.1 g/dL   Albumin 4.2 3.5 - 5.0 g/dL   AST 48 (H) 15 - 41 U/L   ALT 26 0 - 44 U/L   Alkaline Phosphatase 85 38 - 126 U/L   Total Bilirubin 0.9 0.3 - 1.2 mg/dL   GFR, Estimated 51 (L) >60 mL/min    Comment: (NOTE) Calculated using the CKD-EPI Creatinine Equation (2021)    Anion gap 8 5 - 15    Comment: Performed at [facility], [address], [place],  [place] [address]  TSH     Status: Abnormal   Collection Time: 06/26/23  3:54 PM  Result Value Ref Range   TSH 5.573 (H) 0.350 - 4.500 uIU/mL    Comment: Performed by a 3rd Generation assay with a functional sensitivity of <=0.01 uIU/mL. Performed at [facility], [address] [place], [place] [address]   B Nat Peptide     Status: None   Collection Time: 06/26/23  3:54 PM  Result Value Ref Range   B Natriuretic Peptide 63.7 0.0 - 100.0 pg/mL    Comment: Performed at [facility], [address] [place], [place] [address]  Digoxin level     Status: Abnormal   Collection Time: 06/26/23  3:54 PM  Result Value Ref Range   Digoxin Level 0.4 (L) 0.8 - 2.0 ng/mL    Comment: Performed at [facility], [address], [place], [place] [address]  Magnesium     Status: None   Collection Time: 06/26/23  3:54 PM  Result Value Ref Range   Magnesium 2.1 1.7 - 2.4 mg/dL    Comment: Performed at [facility], [address] [place], [place] [address]         has a past medical history of (HFpEF) heart failure with preserved ejection fraction (HCC), Atrial fibrillation (HCC), BPH (benign prostatic hyperplasia), CAD (coronary artery disease), native coronary artery, DM (diabetes mellitus) (HCC), Hyperlipidemia type II, Hypertension, [name] (monoclonal gammopathy  of unknown significance) (07/31/2018), OSA (obstructive sleep apnea), Pacemaker, Peripheral neuropathy (07/31/2018), Pulmonary fibrosis (HCC), and Subdural hematoma (HCC) (07/2012).   reports that he quit smoking about 32 years ago. His smoking use included cigarettes. He started smoking about 52 years ago. He has a 40 pack-year smoking history. He has never used smokeless tobacco.  Past Surgical History:  Procedure Laterality Date   AMPUTATION Left 05/17/2019   Procedure: AMPUTATION LEFT FOURTH TOE;  Surgeon: Cammy Copa, MD;  Location: [facility] OR;  Service: Orthopedics;  Laterality: Left;   AMPUTATION TOE Right 02/06/2020   Procedure: AMPUTATION TOE;  Surgeon: Nadara Mustard, MD;  Location: [facility] OR;  Service: Orthopedics;  Laterality: Right;   AMPUTATION TOE Right 08/17/2020   Procedure: AMPUTATION TOE 4th toe;  Surgeon: [name], DPM;  Location: [facility] ORS;  Service: Podiatry;  Laterality: Right;   APPENDECTOMY     CHOLECYSTECTOMY     CORONARY ARTERY BYPASS GRAFT     x3 (left internal mammary artery to distal left anterior descending coronary artery, saphenous vain graft to second circumflex marginal branch, saphenous vain graft to posterior descending coronary artery, endoscopic saphenous vain harvest from right thigh) and modified Cox - Maze IV procedure.  [name]. [name],MD. Electronically signed CHO/MEDQ D: 01/09/2008/ JOB: [id number] cc:  Antionette Char MD   CRANIOTOMY  07/30/2012   Procedure: CRANIOTOMY HEMATOMA EVACUATION SUBDURAL;  Surgeon: Mariam Dollar, MD;  Location: [facility] NEURO ORS;  Service: Neurosurgery;  Laterality: Right;  Right craniotomy for evacuation of subdural hematoma   FOOT SURGERY     HERNIA REPAIR     INTRAMEDULLARY (IM) NAIL INTERTROCHANTERIC Right 02/04/2020   Procedure: INTRAMEDULLARY (IM) NAIL INTERTROCHANTRIC;  Surgeon: Beverely Low, MD;  Location: [facility] OR;  Service: Orthopedics;  Laterality: Right;   ORCHIECTOMY     Left  /  testicular cancer   PACEMAKER PLACEMENT      PPM - St. Jude   PPM GENERATOR CHANGEOUT N/A 06/25/2019   Procedure: PPM GENERATOR  CHANGEOUT;  Surgeon: Marinus Maw, MD;  Location: [name] INVASIVE CV LAB;  Service: Cardiovascular;  Laterality: N/A;   RIGHT HEART CATH N/A 05/22/2023   Procedure: RIGHT HEART CATH;  Surgeon: Laurey Morale, MD;  Location: [facility] INVASIVE CV LAB;  Service: Cardiovascular;  Laterality: N/A;    Allergies  Allergen Reactions   Aricept [Donepezil] Other (See Comments)    Hallucination    Baclofen Itching   Lipitor [Atorvastatin] Other (See Comments)    Stiff joints   Nsaids Other (See Comments)    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   Warfarin And Related Other (See Comments)    Stiff joints   Enbrel [Etanercept] Itching    Immunization History  Administered Date(s) Administered   Fluad Quad(high Dose 65+) 08/18/2019, 11/08/2021   Influenza, High Dose Seasonal PF 09/17/2018   PFIZER(Purple Top)SARS-COV-2 Vaccination 06/21/2020, 07/12/2020   Tdap 08/14/2014, 07/06/2019   Zoster Recombinant(Shingrix) 06/30/2019    Family History  Problem Relation Age of Onset   Heart disease Father    Heart attack Father    Heart failure Father    Heart disease Mother    Alzheimer's disease Mother    Dementia Mother      Current Outpatient Medications:    allopurinol (ZYLOPRIM) 300 MG tablet, Take 1 tablet (300 mg total) by mouth daily., Disp: 30 tablet, Rfl: 0   colchicine 0.6 MG tablet, TAKE 1 TO 3 TABLETS AS NEEDED DAILY, Disp: , Rfl:    dapagliflozin propanediol (FARXIGA) 10 MG TABS tablet, Take 1 tablet (10 mg total) by mouth daily before breakfast., Disp: 30 tablet, Rfl: 11   digoxin (LANOXIN) 0.125 MG tablet, TAKE ONE TABLET BY MOUTH MONDAY THROUGH FRIDAY. DO NOT TAKE ON SATURDAY OR SUNDAY. (Patient taking differently: Take 0.125 mg by mouth See admin instructions. Take 0.125 mg by mouth Monday - Saturday, skipping Sunday.), Disp: 90 tablet, Rfl: 3   ezetimibe (ZETIA) 10 MG tablet, Take 1 tablet (10 mg total) by  mouth daily., Disp: 90 tablet, Rfl: 3   ferrous sulfate 325 (65 FE) MG tablet, Take 1 tablet (325 mg total) by mouth daily with breakfast., Disp: 30 tablet, Rfl: 3   memantine (NAMENDA) 10 MG tablet, Take 10 mg by mouth 2 (two) times daily., Disp: , Rfl:    metoprolol succinate (TOPROL-XL) 25 MG 24 hr tablet, Take 25 mg by mouth daily., Disp: , Rfl:    mirabegron ER (MYRBETRIQ) 50 MG TB24 tablet, Take 1 tablet (50 mg total) by mouth daily., Disp: 30 tablet, Rfl: 3   modafinil (PROVIGIL) 100 MG tablet, TAKE 1 TABLET BY MOUTH EVERY DAY, Disp: 30 tablet, Rfl: 5   Multiple Vitamin (MULTIVITAMIN WITH MINERALS) TABS tablet, Take 1 tablet by mouth daily., Disp:  , Rfl:    NON FORMULARY, Pt uses a cpap nightly, Disp: , Rfl:    pantoprazole (PROTONIX) 40 MG tablet, Take 1 tablet (40 mg total) by mouth daily., Disp: 30 tablet, Rfl: 0   potassium chloride SA (KLOR-CON M) 20 MEQ tablet, Take 1 tablet (20 mEq total) by mouth daily., Disp: 90 tablet, Rfl: 3   rosuvastatin (CRESTOR) 40 MG tablet, Take 1 tablet (40 mg total) by mouth daily., Disp: 90 tablet, Rfl: 2   sertraline (ZOLOFT) 100 MG tablet, Take 2 tablets (200 mg total) by mouth daily. (Patient taking differently: Take 100 mg by mouth daily. 1.5 tablets daily), Disp: 30 tablet, Rfl: 0   silver sulfADIAZINE (SILVADENE) 1 % cream, APPLY 1 APPLICATION TOPICALLY DAILY,  Disp: 50 g, Rfl: 0   spironolactone (ALDACTONE) 25 MG tablet, Take 0.5 tablets (12.5 mg total) by mouth daily., Disp: 45 tablet, Rfl: 3   tamsulosin (FLOMAX) 0.4 MG CAPS capsule, Take 1 capsule (0.4 mg total) by mouth at bedtime., Disp: 30 capsule, Rfl: 1   Teriparatide, Recombinant, 620 MCG/2.48ML SOPN, inject 20 mcg Subcutaneous Once a day (Patient taking differently: Takes injection twice a year.), Disp: 1 mL, Rfl: 11   torsemide (DEMADEX) 20 MG tablet, Take 3 tablets (60 mg total) by mouth every morning AND 2 tablets (40 mg total) every evening., Disp: 200 tablet, Rfl: 6      Objective:    Vitals:   07/10/23 1305  BP: 102/66  Pulse: 76  SpO2: 97%  Weight: 182 lb 12.8 oz (82.9 kg)  Height: 5\' 8"  (1.727 m)    Estimated body mass index is 27.79 kg/m as calculated from the following:   Height as of this encounter: 5\' 8"  (1.727 m).   Weight as of this encounter: 182 lb 12.8 oz (82.9 kg).  @WEIGHTCHANGE @  [employer]   07/10/23 1305  Weight: 182 lb 12.8 oz (82.9 kg)     Physical Exam   General: No distress.  Deconditioned lookingnDeconitioned looking O2 at rest: no Cane present: YES Sitting in wheel chair: no Frail: noo Obese: no Neuro: Alert and Oriented x 3. GCS 15. Speech normal Psych: Pleasant Resp:  Barrel Chest - no.  Wheeze - no, Crackles - yes mild, No overt respiratory distress CVS: Normal heart sounds. Murmurs - no Ext: Stigmata of Connective Tissue Disease - no. EDEMA DIMINSHED HEENT: Normal upper airway. PEERL +. No post nasal drip        Assessment:       ICD-10-CM   1. IPF (idiopathic pulmonary fibrosis) (HCC)  J84.112 Pulse oximetry, overnight    2. OSA (obstructive sleep apnea)  G47.33     3. Encounter for therapeutic drug monitoring  Z51.81          Plan:     Patient Instructions  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.  Worse 2023 0> April 2024 -> stable through aug 2024  Plan - start pirfenidone low dose protocol - clinical trials as care option - discussed but this is for later - need LFT in 1 month   Pedal edema Severe tricuspid regurgitation Right ventricular dysfunction  -  RHC shows pulmonary venous congenstion related incrased pressures - glad you are better  Plan   = per cardiology    Hx of OSA  -Not sure why your oxygen overnight desaturation test was not done in April 2024 Plan  - continue CPAP -CMA to order  07/10/2023  ONO on room air while using CPAP   Follow-up - Return in the next 8 weeks for 30-minute visit with Dr.  Marchelle Gearing or APP to regroup and discuss   FOLLOWUP Return in about 8 weeks (around 09/04/2023) for 15 min visit, ILD, with any of the APPS, with Dr Marchelle Gearing, Face to Face OR Video Visit.    SIGNATURE    Dr. Kalman Shan, M.D., F.C.C.P,  Pulmonary and Critical Care Medicine Staff Physician, [facility] Center Director - Interstitial Lung Disease  Program  Pulmonary Fibrosis [facility] [place], [place], [address]  Pager: [phone], If no answer or between  15:00h - 7:00h: call [phone] Telephone: [phone]  1:51 PM 07/10/2023    HIGh Complexity  OFFICE   2021 E/M guidelines, first released in 2021, with minor revisions added in 2023. Must meet the requirements for 2 out of 3 dimensions to qualify.    Number and complexity of problems addressed Amount and/or complexity of data reviewed Risk of complications and/or morbidity  Severe exacerbation of chronic illness  Acute or chronic illnesses that may pose a threat to life or bodily function, e.g., multiple trauma, acute MI, pulmonary embolus, severe respiratory distress, progressive rheumatoid arthritis, psychiatric illness with potential threat to self or others, peritonitis, acute renal failure, abrupt change in neurological status Must meet the requirements for 2 of 3 of the categories)  Category 1: Tests and documents, historian  Any combination of 3 of the following:  Assessment requiring an independent historian  Review of prior external note(s) from each unique source  Review of results of each unique test  Ordering of each unique test    Category 2: Interpretation of tests    Independent interpretation of a test performed by another physician/other qualified health care professional (not separately reported)  Category 3: Discuss management/tests  Discussion of management or test interpretation with external physician/other qualified health care  professional/appropriate source (not separately reported) - harmacy  HIGH risk of morbidity from additional diagnostic testing or treatment Examples only:  Drug therapy requiring intensive monitoring for toxicity  Decision for elective major surgery with identified pateint or procedure risk factors  Decision regarding hospitalization or escalation of level of care  Decision for DNR or to de-escalate care   Parenteral controlled  substances

## 2023-07-10 NOTE — Patient Instructions (Signed)
Spiro/DLCO performed today. 

## 2023-07-10 NOTE — Progress Notes (Signed)
Spiro/DLCO performed today. 

## 2023-07-10 NOTE — Patient Instructions (Addendum)
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  Worse 2023 0> April 2024 -> stable through aug 2024  Plan - start pirfenidone low dose protocol - clinical trials as care option - discussed but this is for later - need LFT in 1 month   Pedal edema Severe tricuspid regurgitation Right ventricular dysfunction  -  RHC shows pulmonary venous congenstion related incrased pressures - glad you are better  Plan   = per cardiology    Hx of OSA  -Not sure why your oxygen overnight desaturation test was not done in April 2024 Plan  - continue CPAP -CMA to order  07/10/2023  ONO on room air while using CPAP   Follow-up - Return in the next 8 weeks for 30-minute visit with Dr. Marchelle Gearing or APP to regroup and discuss

## 2023-07-11 ENCOUNTER — Telehealth: Payer: Self-pay | Admitting: Pharmacist

## 2023-07-11 DIAGNOSIS — J84112 Idiopathic pulmonary fibrosis: Secondary | ICD-10-CM

## 2023-07-11 DIAGNOSIS — Z5181 Encounter for therapeutic drug level monitoring: Secondary | ICD-10-CM

## 2023-07-11 LAB — PULMONARY FUNCTION TEST
DL/VA: 2.97 ml/min/mmHg/L
DLCO cor: 7.86 ml/min/mmHg
FEF2575-%Pred-Pre: 169 %
FEV1-%Pred-Pre: 90 %
FEV1-Pre: 2.27 L
FEV6-%Pred-Pre: 80 %
FEV6-Pre: 2.63 L
FVC-%Pred-Pre: 75 %
Pre FEV1/FVC ratio: 85 %
Pre FEV6/FVC Ratio: 99 %

## 2023-07-11 NOTE — Telephone Encounter (Signed)
Submitted a Prior Authorization request to [facility] for ESBRIET (brand name) via CoverMyMeds. Will update once we receive a response. Based on clinical questions, Pirfenidone will likely be preferred option  Key: [id number]  Patient will be on low dose: Take 1 tab three times daily for 7 days, then 2 tabs three times daily thereafter (low dose as maintenance)  Chesley Mires, PharmD, MPH, BCPS, CPP Clinical Pharmacist (Rheumatology and Pulmonology)

## 2023-07-11 NOTE — Telephone Encounter (Signed)
Received a fax regarding Prior Authorization from [facility] for ESBRIET. Authorization has been DENIED because it is not on plan's Drug List (formulary). Patient must try and fail pirfenidone or have specific medical reason why the covered drug is not appropriate for patient.  Submitted a Prior Authorization request to [facility] for PIRFENIDONE via CoverMyMeds. Will update once we receive a response.  Key: [id number]

## 2023-07-12 ENCOUNTER — Telehealth (HOSPITAL_COMMUNITY): Payer: Self-pay

## 2023-07-12 ENCOUNTER — Ambulatory Visit: Payer: Medicare Other | Admitting: Podiatry

## 2023-07-12 NOTE — Telephone Encounter (Signed)
Called and left patient a voice message to confirm/remind patient of their appointment at the [facility] on 07/13/23.   And to bring in all medications and/or complete list.

## 2023-07-13 ENCOUNTER — Ambulatory Visit (HOSPITAL_COMMUNITY)
Admission: RE | Admit: 2023-07-13 | Discharge: 2023-07-13 | Disposition: A | Discharge status: Home / Self Care | Payer: Medicare Other | Source: Ambulatory Visit | Attending: Family Medicine | Admitting: Family Medicine

## 2023-07-13 ENCOUNTER — Encounter (HOSPITAL_COMMUNITY): Payer: Self-pay

## 2023-07-13 VITALS — BP 100/62 | HR 80 | Wt 185.0 lb

## 2023-07-13 DIAGNOSIS — I4821 Permanent atrial fibrillation: Secondary | ICD-10-CM | POA: Diagnosis not present

## 2023-07-13 DIAGNOSIS — I272 Pulmonary hypertension, unspecified: Secondary | ICD-10-CM | POA: Insufficient documentation

## 2023-07-13 DIAGNOSIS — I495 Sick sinus syndrome: Secondary | ICD-10-CM | POA: Insufficient documentation

## 2023-07-13 DIAGNOSIS — J849 Interstitial pulmonary disease, unspecified: Secondary | ICD-10-CM | POA: Insufficient documentation

## 2023-07-13 DIAGNOSIS — I5032 Chronic diastolic (congestive) heart failure: Secondary | ICD-10-CM | POA: Insufficient documentation

## 2023-07-13 DIAGNOSIS — Z951 Presence of aortocoronary bypass graft: Secondary | ICD-10-CM | POA: Diagnosis not present

## 2023-07-13 DIAGNOSIS — J8410 Pulmonary fibrosis, unspecified: Secondary | ICD-10-CM | POA: Diagnosis not present

## 2023-07-13 DIAGNOSIS — Z87891 Personal history of nicotine dependence: Secondary | ICD-10-CM | POA: Insufficient documentation

## 2023-07-13 DIAGNOSIS — I4819 Other persistent atrial fibrillation: Secondary | ICD-10-CM

## 2023-07-13 DIAGNOSIS — Z950 Presence of cardiac pacemaker: Secondary | ICD-10-CM | POA: Diagnosis not present

## 2023-07-13 DIAGNOSIS — I2510 Atherosclerotic heart disease of native coronary artery without angina pectoris: Secondary | ICD-10-CM | POA: Insufficient documentation

## 2023-07-13 DIAGNOSIS — G4733 Obstructive sleep apnea (adult) (pediatric): Secondary | ICD-10-CM

## 2023-07-13 DIAGNOSIS — Z79899 Other long term (current) drug therapy: Secondary | ICD-10-CM | POA: Diagnosis not present

## 2023-07-13 DIAGNOSIS — I071 Rheumatic tricuspid insufficiency: Secondary | ICD-10-CM | POA: Diagnosis not present

## 2023-07-13 DIAGNOSIS — Z8249 Family history of ischemic heart disease and other diseases of the circulatory system: Secondary | ICD-10-CM | POA: Insufficient documentation

## 2023-07-13 DIAGNOSIS — I110 Hypertensive heart disease with heart failure: Secondary | ICD-10-CM | POA: Insufficient documentation

## 2023-07-13 LAB — BASIC METABOLIC PANEL
Anion gap: 6 (ref 5–15)
BUN: 20 mg/dL (ref 8–23)
CO2: 26 mmol/L (ref 22–32)
Calcium: 9.3 mg/dL (ref 8.9–10.3)
Chloride: 106 mmol/L (ref 98–111)
Creatinine, Ser: 1.54 mg/dL — ABNORMAL HIGH (ref 0.61–1.24)
GFR, Estimated: 45 mL/min — ABNORMAL LOW (ref >60)
Glucose, Bld: 91 mg/dL (ref 70–99)
Potassium: 5.3 mmol/L — ABNORMAL HIGH (ref 3.5–5.1)
Sodium: 138 mmol/L (ref 135–145)

## 2023-07-13 LAB — BRAIN NATRIURETIC PEPTIDE: B Natriuretic Peptide: 119.9 pg/mL — ABNORMAL HIGH (ref 0.0–100.0)

## 2023-07-13 MED ORDER — POTASSIUM CHLORIDE CRYS ER 20 MEQ PO TBCR: EXTENDED_RELEASE_TABLET | ORAL | 3 refills | Status: DC

## 2023-07-13 MED ORDER — TORSEMIDE 20 MG PO TABS
60.0000 mg | ORAL_TABLET | Freq: Two times a day (BID) | ORAL | 6 refills | Status: DC
Start: 2023-07-13 — End: 2023-11-07

## 2023-07-13 NOTE — Progress Notes (Signed)
PCP: Merri Brunette, MD Cardiology: Dr. Jens Som HF Cardiology: Dr. Shirlee Latch  82 y.o. with history of permanent atrial fibrillation, CAD s/p CABG, St Jude PPM, pulmonary fibrosis, and chronic RV failure with severe TR was referred by Dr. Marchelle Gearing for evaluation of CHF/RV failure. Patient had CABG and Maze in 1/09.  In 8/13, he had a subdural hematoma while on warfarin, craniotomy done.  He has been off anticoagulation since that time.  His atrial fibrillation is now permanent.  He has a Secondary school teacher PPM for tachy/brady syndrome.  He is followed by Dr. Marchelle Gearing for interstitial lung disease, CT chest consistent with UIP.  He is not on anti-fibrotics.   Echoes going back to 2020 have shown RV dysfunction with severe TR. Most recent echo in 3/24 showed EF 55-60%, D-shaped septum with moderate RV enlargement and moderate RV dysfunction, severe TR, dilated IVC.  RHC was done in 6/24, showing mean RA 22 with prominent CV waves, PA 44/22 mean 33, mean PCWP 19, CI 2.29, PVR 3.1 WU, PAPi 1 => predominantly pulmonary venous hypertension with significant RV dysfunction.   Torsemide was increased to 40 mg bid.   Follow up 7/24, he was volume overloaded. Farxiga 10 mg daily and spiro 12.5 mg daily added, and torsemide increased to 60/40.    Patient is here with his wife for HF follow up.  He has mild dementia but gives most of history with some help from his wife. Overall feeling fair. He is not SOB walking with his cane on flat ground. Not urinating as briskly on current diuretic dose but LE swelling has improved. Occasional palpitations, had 1 episode of non-exertional chest pain, lasted 3-4 minutes then resolved. Denies abnormal bleeding, dizziness, or PND/Orthopnea. Appetite fair. No fever or chills. Weight at home 182-183 pounds. Taking all medications. Wears CPAP.  Device interrogation (personally reviewed): 5.9% Vpaced, 85% AF burden  ECG (personally reviewed): none ordered today.  Labs (5/24): K 3.6, creatinine  1.37 Labs (7/24): K 4.8, creatinine 1.4  PMH: 1. Atrial fibrillation: Maze in 1/09 with CABG.  AF now permanent. Not anticoagulated since subdural hemorrhage.  2. Subdural hematoma: 8/13, patient was on warfarin at the time.  Had craniotomy.  He has been off anticoagulation since then.  3. CAD: s/p CABG 1/09 with LIMA-LAD, SVG-OM2, SVG-PDA.  4. Interstitial lung disease: Pulmonary fibrosis, UIP pattern.  Sees Dr. Marchelle Gearing.  - CT chest (3/24): Fibrotic ILD.  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 DM2 9. HTN 10. Hyperlipidemia 11. Testicular cancer: s/p orchiectomy 12. OSA: CPAP 13. 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  - Echo (11/22): severe TR, RV dysfunction.  - Echo (3/24): EF 55-60%, D-shaped septum with moderate RV enlargement and moderate RV dysfunction, severe TR, dilated IVC.  - RHC (6/24): mean RA 22 with prominent CV waves, PA 44/22 mean 33, mean PCWP 19, CI 2.29, PVR 3.1 WU, PAPi 1 => predominantly pulmonary venous hypertension with significant RV dysfunction.   Family History  Problem Relation Age of Onset   Heart disease Father    Heart attack Father    Heart failure Father    Heart disease Mother    Alzheimer's disease Mother    Dementia Mother    Social History   Socioeconomic History   Marital status: Married    Spouse name: Noel Journey   Number of children: 2   Years of education: Not on  file   Highest education level: Not on file  Occupational History   Occupation: Retired- Theatre stage manager  Tobacco Use   Smoking status: Former    Current packs/day: 0.00    Average packs/day: 2.0 packs/day for 20.0 years (40.0 ttl pk-yrs)    Types: Cigarettes    Start date: 02/21/1971    Quit date: 02/21/1991    Years since quitting: 32.4   Smokeless tobacco: Never  Vaping Use   Vaping status: Never Used  Substance and Sexual Activity   Alcohol use: No    Alcohol/week: 0.0  standard drinks of alcohol   Drug use: No   Sexual activity: Not Currently  Other Topics Concern   Not on file  Social History Narrative   Lives with wife   Right handed    Married with two children.     He is a Emergency planning/management officer.     Social Determinants of Health   Financial Resource Strain: Not on file  Food Insecurity: Not on file  Transportation Needs: Not on file  Physical Activity: Not on file  Stress: Not on file  Social Connections: Not on file  Intimate Partner Violence: Not on file   ROS: All systems reviewed and negative except as per HPI.   Current Outpatient Medications  Medication Sig Dispense Refill   allopurinol (ZYLOPRIM) 300 MG tablet Take 1 tablet (300 mg total) by mouth daily. 30 tablet 0   colchicine 0.6 MG tablet TAKE 1 TO 3 TABLETS AS NEEDED DAILY     dapagliflozin propanediol (FARXIGA) 10 MG TABS tablet Take 1 tablet (10 mg total) by mouth daily before breakfast. 30 tablet 11   digoxin (LANOXIN) 0.125 MG tablet TAKE ONE TABLET BY MOUTH MONDAY THROUGH FRIDAY. DO NOT TAKE ON SATURDAY OR SUNDAY. (Patient taking differently: Take 0.125 mg by mouth See admin instructions. Take 0.125 mg by mouth Monday - Saturday, skipping Sunday.) 90 tablet 3   ezetimibe (ZETIA) 10 MG tablet Take 1 tablet (10 mg total) by mouth daily. 90 tablet 3   ferrous sulfate 325 (65 FE) MG tablet Take 1 tablet (325 mg total) by mouth daily with breakfast. 30 tablet 3   memantine (NAMENDA) 10 MG tablet Take 10 mg by mouth 2 (two) times daily.     metoprolol succinate (TOPROL-XL) 25 MG 24 hr tablet Take 25 mg by mouth daily.     mirabegron ER (MYRBETRIQ) 50 MG TB24 tablet Take 1 tablet (50 mg total) by mouth daily. 30 tablet 3   modafinil (PROVIGIL) 100 MG tablet TAKE 1 TABLET BY MOUTH EVERY DAY 30 tablet 5   Multiple Vitamin (MULTIVITAMIN WITH MINERALS) TABS tablet Take 1 tablet by mouth daily.     NON FORMULARY Pt uses a cpap nightly     pantoprazole (PROTONIX) 40 MG tablet Take 1 tablet (40  mg total) by mouth daily. 30 tablet 0   potassium chloride SA (KLOR-CON M) 20 MEQ tablet Take 1 tablet (20 mEq total) by mouth daily. 90 tablet 3   rosuvastatin (CRESTOR) 40 MG tablet Take 1 tablet (40 mg total) by mouth daily. 90 tablet 2   sertraline (ZOLOFT) 100 MG tablet Take 2 tablets (200 mg total) by mouth daily. (Patient taking differently: Take 100 mg by mouth daily. 1.5 tablets daily) 30 tablet 0   silver sulfADIAZINE (SILVADENE) 1 % cream APPLY 1 APPLICATION TOPICALLY DAILY (Patient taking differently: Apply 1 Application topically daily as needed.) 50 g 0   spironolactone (ALDACTONE) 25 MG tablet Take  0.5 tablets (12.5 mg total) by mouth daily. 45 tablet 3   tamsulosin (FLOMAX) 0.4 MG CAPS capsule Take 1 capsule (0.4 mg total) by mouth at bedtime. 30 capsule 1   Teriparatide, Recombinant, 620 MCG/2.48ML SOPN inject 20 mcg Subcutaneous Once a day (Patient taking differently: Takes injection twice a year.) 1 mL 11   torsemide (DEMADEX) 20 MG tablet Take 3 tablets (60 mg total) by mouth every morning AND 2 tablets (40 mg total) every evening. 200 tablet 6   No current facility-administered medications for this encounter.   Wt Readings from Last 3 Encounters:  07/13/23 83.9 kg (185 lb)  07/10/23 82.9 kg (182 lb 12.8 oz)  06/26/23 81.4 kg (179 lb 6.4 oz)   BP 100/62   Pulse 80   Wt 83.9 kg (185 lb)   SpO2 93%   BMI 28.13 kg/m  Physical Exam General:  NAD. No resp difficulty HEENT: Normal Neck: Supple. No JVD. Carotids 2+ bilat; no bruits. No lymphadenopathy or thryomegaly appreciated. Cor: PMI nondisplaced. Irregular rate & rhythm. No rubs, gallops, 2/6 TR Lungs: Clear Abdomen: Soft, nontender, nondistended. No hepatosplenomegaly. No bruits or masses. Good bowel sounds. Extremities: No cyanosis, clubbing, rash, 1-2+ BLE edema; chronic venous stasis changes, + fluid-filled blister RLE Neuro: Alert & oriented x 3, cranial nerves grossly intact. Moves all 4 extremities w/o  difficulty. Affect pleasant.  Assessment/Plan: 1. Chronic diastolic CHF: With prominent RV failure and severe TR.  Echoes since 2020 have shown RV failure and severe TR.  Most recent echo in 3/24 showed EF 55-60%, D-shaped septum with moderate RV enlargement and moderate RV dysfunction, severe TR, dilated IVC.  RHC was done in 6/24, showing mean RA 22 with prominent CV waves, PA 44/22 mean 33, mean PCWP 19, CI 2.29, PVR 3.1 WU, PAPi 1 => predominantly pulmonary venous hypertension with significant RV dysfunction.  NYHA II, he remains volume overloaded. - Place unna boots (recent ABIs ok in 11/23), RLE with serous-filled blister, ABIs 11/23 normal - Increase torsemide to 60 mg bid, increase KCL to 20 am/10 pm. BMET and BNP today, repeat BMET in 10 days. - Continue spironolactone 12.5 mg daily. - Continue Farxiga 10 mg daily.  - Continue digoxin for RV support. Dig level (06/25/23) 0.4 2. 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  Cause uncertain, could be atrial functional TR with long-standing atrial fibrillation.  Suspect this has significantly worsened his RV failure.   - With his age, frailty, and dementia, I do not think there are options to surgically or percutaneously treat his TR.  - Focus on decongestion.  3. Pulmonary hypertension: Mild on RHC, predominantly pulmonary venous hypertension due to LV diastolic dysfunction.  - Treatment will be diuresis.  4. Interstitial lung disease: UIP, followed by Dr. Marchelle Gearing.  He is not on anti-fibrotics.  With pulmonary venous hypertension, not a candidate for Tyvaso.  5. Atrial fibrillation: Permanent.  Rate is controlled.  - Continue Toprol XL 25 daily and digoxin.  - He has been deemed not a candidate for anticoagulation after subdural hemorrhage in the past.  6. 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   7. CAD: s/p CABG 1/09.  No chest pain.  - Continue Crestor and Zetia.  8. OSA: Continue CPAP.   Follow up in 3- 4 weeks with APP for fluid  check.  Anderson Malta [facility] FNP-BC 07/13/2023

## 2023-07-13 NOTE — Patient Instructions (Signed)
Medication Changes:  INCREASE TORSEMIDE TO 60MG  TWICE DAILY   INCREASE POTASSIUM TO IN THE MORNING AND IN THE EVENING   Lab Work:  Labs done today, your results will be available in MyChart, we will contact you for abnormal readings.  THEN RETURN IN 10 DAYS FOR REPEAT BLOOD WORK AS SCHEDULED  Referrals:  YOU HAVE BEEN REFERRED TO HOME HEALTH THEY WILL REACH OUT TO YOU OR CALL TO ARRANGE THIS. PLEASE CALL us WITH ANY CONCERNS   Follow-Up in: 4 WEEKS WITH APP AND THEN AROUND 4 MONTHS. PLEASE CALL OUR OFFICE AROUND SEPTEMBER TO GET SCHEDULED FOR YOUR APPOINTMENT. PHONE NUMBER IS [phone] OPTION 2   At the [facility], you and your health needs are our priority. We have a designated team specialized in the treatment of Heart Failure. This Care Team includes your primary Heart Failure Specialized Cardiologist (physician), Advanced Practice Providers (APPs- Physician Assistants and Nurse Practitioners), and Pharmacist who all work together to provide you with the care you need, when you need it.   You may see any of the following providers on your designated Care Team at your next follow up:  Dr. Arvilla Meres Dr. Marca Ancona Dr. Marcos Eke, NP Robbie Lis, [place] [facility], [place] Brynda Peon, NP Karle Plumber, PharmD   Please be sure to bring in all your medications bottles to every appointment.   Need to Contact us:  If you have any questions or concerns before your next appointment please send [place] a message through [place] or call our office at [phone].    TO LEAVE A MESSAGE FOR THE NURSE SELECT OPTION 2, PLEASE LEAVE A MESSAGE INCLUDING: YOUR NAME DATE OF BIRTH CALL BACK NUMBER REASON FOR CALL**this is important as we prioritize the call backs  YOU WILL RECEIVE A CALL BACK THE SAME DAY AS LONG AS YOU CALL BEFORE 4:00 PM

## 2023-07-13 NOTE — Progress Notes (Signed)
Remote pacemaker transmission.   

## 2023-07-16 ENCOUNTER — Telehealth (HOSPITAL_COMMUNITY): Payer: Self-pay

## 2023-07-16 ENCOUNTER — Other Ambulatory Visit: Payer: Self-pay

## 2023-07-16 ENCOUNTER — Other Ambulatory Visit (HOSPITAL_COMMUNITY): Payer: Self-pay

## 2023-07-16 MED ORDER — PIRFENIDONE 267 MG PO TABS
ORAL_TABLET | ORAL | 0 refills | Status: DC
Start: 2023-07-16 — End: 2023-08-30
  Filled 2023-07-16: qty 159, 30d supply, fill #0
  Filled 2023-07-16: qty 159, fill #0

## 2023-07-16 MED ORDER — PIRFENIDONE 267 MG PO TABS
534.0000 mg | ORAL_TABLET | Freq: Three times a day (TID) | ORAL | 3 refills | Status: DC
Start: 2023-07-16 — End: 2023-08-09
  Filled 2023-07-16: qty 180, 30d supply, fill #0

## 2023-07-16 NOTE — Telephone Encounter (Signed)
Delivery instructions have been updated in [facility], medication will be shipped to patient's home address on 07/17/23.  Rx has been processed in [facility] and  grant info has been added to pt's chart.

## 2023-07-16 NOTE — Telephone Encounter (Addendum)
Pt aware, agreeable, and verbalized understanding   ----- Message from [name] sent at 07/13/2023  4:49 PM EDT ----- Labs stable, K elevated.  Stop KCL supplement. He has repeat labs arranged

## 2023-07-16 NOTE — Telephone Encounter (Signed)
Received notification from [facility] regarding a prior authorization for PIRFENIDONE. Authorization has been APPROVED from 07/12/2023 to 12/18/2023. Approval letter sent to scan center.  Per test claim, copay for 30 days supply is $167.53  Patient can fill through [facility]: [phone]   Authorization # [id number] Phone # [phone]  Patient enrolled into pulmonary fibrosis grant through PAF: Award Period: 01/17/2023 - 07/15/2024 ID: [id number] BIN: [id number] PCN: [id number] Group: [id number] For pharmacy inquiries: [phone]. For patient inquiries: [phone].  I spoke with patient's wife counseled on purpose, proper use, and potential adverse effects including nausea, vomiting, abdominal pain, GERD, weight loss, arthralgia, dizziness, and suns sensitivity/rash. She manages his medications due to his memory loss. Reviewed general symptomatology with disease progression. She confirms that patient does not spend much time in garden or doing yardwork anymore. Confirmed that medication is unlikely to contribute to hallucinations.  Stressed the importance of routine lab monitoring. Will monitor LFT's every month for the first 6 months of treatment then every 3 months.Standing order for hepatic function panel placed.  Starting dose will be pirfenidone 267 mg 1 tablet three times daily for 7 days, then 2 tablets three times daily as maintenance. He will stay on low dose.  Stressed the importance of taking with protein-rich snacks or meals to minimize stomach upset.    Rx sent to  [facility]. Wife confirmed address on file is correct for shipping. She is point of contact for communication because she manages his meds. Provided her with phone number for future refill shipments. Routing to [place] for onboarding needs.  Chesley Mires, PharmD, MPH, BCPS, CPP Clinical Pharmacist (Rheumatology and Pulmonology)

## 2023-07-24 ENCOUNTER — Other Ambulatory Visit (HOSPITAL_COMMUNITY): Payer: Medicare Other

## 2023-07-24 ENCOUNTER — Encounter: Payer: Self-pay | Admitting: Cardiology

## 2023-07-30 ENCOUNTER — Ambulatory Visit (HOSPITAL_COMMUNITY)
Admission: RE | Admit: 2023-07-30 | Discharge: 2023-07-30 | Disposition: A | Discharge status: Home / Self Care | Payer: Medicare Other | Source: Ambulatory Visit | Attending: Family Medicine | Admitting: Family Medicine

## 2023-07-30 ENCOUNTER — Ambulatory Visit: Payer: Medicare Other | Admitting: Podiatry

## 2023-07-30 ENCOUNTER — Encounter: Payer: Self-pay | Admitting: Podiatry

## 2023-07-30 VITALS — BP 110/57 | HR 103

## 2023-07-30 DIAGNOSIS — E1149 Type 2 diabetes mellitus with other diabetic neurological complication: Secondary | ICD-10-CM

## 2023-07-30 DIAGNOSIS — L84 Corns and callosities: Secondary | ICD-10-CM

## 2023-07-30 DIAGNOSIS — I5032 Chronic diastolic (congestive) heart failure: Secondary | ICD-10-CM | POA: Insufficient documentation

## 2023-07-30 DIAGNOSIS — I739 Peripheral vascular disease, unspecified: Secondary | ICD-10-CM

## 2023-07-30 DIAGNOSIS — B351 Tinea unguium: Secondary | ICD-10-CM | POA: Diagnosis not present

## 2023-07-30 LAB — BASIC METABOLIC PANEL
Anion gap: 12 (ref 5–15)
BUN: 34 mg/dL — ABNORMAL HIGH (ref 8–23)
CO2: 27 mmol/L (ref 22–32)
Calcium: 9.3 mg/dL (ref 8.9–10.3)
Chloride: 98 mmol/L (ref 98–111)
Creatinine, Ser: 1.65 mg/dL — ABNORMAL HIGH (ref 0.61–1.24)
GFR, Estimated: 42 mL/min — ABNORMAL LOW (ref >60)
Glucose, Bld: 100 mg/dL — ABNORMAL HIGH (ref 70–99)
Potassium: 3.8 mmol/L (ref 3.5–5.1)
Sodium: 137 mmol/L (ref 135–145)

## 2023-07-31 LAB — PULMONARY FUNCTION TEST
DL/VA % pred: 75 %
DLCO cor % pred: 35 %
FEV1FVC-%Pred-Pre: 118 %
FEV6FVC-%Pred-Pre: 106 %

## 2023-08-02 DIAGNOSIS — I2721 Secondary pulmonary arterial hypertension: Secondary | ICD-10-CM | POA: Diagnosis not present

## 2023-08-02 DIAGNOSIS — J849 Interstitial pulmonary disease, unspecified: Secondary | ICD-10-CM | POA: Diagnosis not present

## 2023-08-02 DIAGNOSIS — M810 Age-related osteoporosis without current pathological fracture: Secondary | ICD-10-CM | POA: Diagnosis not present

## 2023-08-02 DIAGNOSIS — I5042 Chronic combined systolic (congestive) and diastolic (congestive) heart failure: Secondary | ICD-10-CM | POA: Diagnosis not present

## 2023-08-02 DIAGNOSIS — I2510 Atherosclerotic heart disease of native coronary artery without angina pectoris: Secondary | ICD-10-CM | POA: Diagnosis not present

## 2023-08-02 DIAGNOSIS — K7460 Unspecified cirrhosis of liver: Secondary | ICD-10-CM | POA: Diagnosis not present

## 2023-08-02 DIAGNOSIS — D61818 Other pancytopenia: Secondary | ICD-10-CM | POA: Diagnosis not present

## 2023-08-02 DIAGNOSIS — N1831 Chronic kidney disease, stage 3a: Secondary | ICD-10-CM | POA: Diagnosis not present

## 2023-08-02 DIAGNOSIS — I10 Essential (primary) hypertension: Secondary | ICD-10-CM | POA: Diagnosis not present

## 2023-08-02 DIAGNOSIS — I4819 Other persistent atrial fibrillation: Secondary | ICD-10-CM | POA: Diagnosis not present

## 2023-08-02 DIAGNOSIS — Z0000 Encounter for general adult medical examination without abnormal findings: Secondary | ICD-10-CM | POA: Diagnosis not present

## 2023-08-02 DIAGNOSIS — R161 Splenomegaly, not elsewhere classified: Secondary | ICD-10-CM | POA: Diagnosis not present

## 2023-08-05 NOTE — Progress Notes (Signed)
Subjective: Chief Complaint  Patient presents with   RFC    RFC   Callouses    RIGHT FOOT     82 year old male presents the office today for diabetic foot exam and for nail trim.  Denies any recent ulcers or changes.  There is a thick callus on the bottom of his right foot.  No open lesions that he reports no drainage or pus.  Objective: AAO x3, NAD DP/PT pulses palpable 1/4 b/l, CRT less than 3 seconds Sensation decreased with Semmes Weinstein monofilament. Thick callus with dried blood present right foot submetatarsal 1.  There is no definitive skin breakdown noted today but the area is preulcerative.  No drainage or purulence. The nails appear to be hypertrophic, dystrophic to the left 1, 3, 5 in the right 1, 3, 5.  Multiple digital contractures are present with history of toe amputations  No pain with calf compression, swelling, warmth, erythema   Assessment: Preulcerative lesion right foot, symptomatic onychomycosis  Plan: -Sharply debride the nails x 6 without any complications or bleeding. -Hypertrophied hyperkeratotic lesion x 1 without any complications or bleeding.  Continue moisturizer, offloading. -During today's appointment the patient became sick and was throwing up.  I called his wife to come back.  She states that he had not been feeling well all weekend.  She is then taken to urgent care upon leaving the office.  He walked out of the office on his own.  -Daily foot inspection.  [name] DPM

## 2023-08-08 ENCOUNTER — Other Ambulatory Visit (HOSPITAL_COMMUNITY): Payer: Self-pay

## 2023-08-08 ENCOUNTER — Telehealth (HOSPITAL_COMMUNITY): Payer: Self-pay

## 2023-08-08 DIAGNOSIS — Z85828 Personal history of other malignant neoplasm of skin: Secondary | ICD-10-CM | POA: Diagnosis not present

## 2023-08-08 DIAGNOSIS — C44311 Basal cell carcinoma of skin of nose: Secondary | ICD-10-CM | POA: Diagnosis not present

## 2023-08-08 DIAGNOSIS — L905 Scar conditions and fibrosis of skin: Secondary | ICD-10-CM | POA: Diagnosis not present

## 2023-08-08 DIAGNOSIS — D485 Neoplasm of uncertain behavior of skin: Secondary | ICD-10-CM | POA: Diagnosis not present

## 2023-08-08 DIAGNOSIS — D044 Carcinoma in situ of skin of scalp and neck: Secondary | ICD-10-CM | POA: Diagnosis not present

## 2023-08-08 NOTE — Telephone Encounter (Signed)
Called and left patient a voice message to confirm/remind patient of their appointment at the [facility] on 08/09/23.   And to bring in all medications and/or complete list.

## 2023-08-09 ENCOUNTER — Ambulatory Visit (HOSPITAL_COMMUNITY)
Admission: RE | Admit: 2023-08-09 | Discharge: 2023-08-09 | Disposition: A | Discharge status: Home / Self Care | Payer: Medicare Other | Source: Ambulatory Visit | Attending: Physician Assistant | Admitting: Physician Assistant

## 2023-08-09 ENCOUNTER — Encounter (HOSPITAL_COMMUNITY): Payer: Self-pay

## 2023-08-09 ENCOUNTER — Telehealth (HOSPITAL_COMMUNITY): Payer: Self-pay | Admitting: Physician Assistant

## 2023-08-09 VITALS — BP 104/60 | HR 83 | Ht 68.0 in | Wt 185.2 lb

## 2023-08-09 DIAGNOSIS — I5032 Chronic diastolic (congestive) heart failure: Secondary | ICD-10-CM | POA: Insufficient documentation

## 2023-08-09 DIAGNOSIS — I4821 Permanent atrial fibrillation: Secondary | ICD-10-CM

## 2023-08-09 DIAGNOSIS — Z79899 Other long term (current) drug therapy: Secondary | ICD-10-CM | POA: Insufficient documentation

## 2023-08-09 DIAGNOSIS — I495 Sick sinus syndrome: Secondary | ICD-10-CM | POA: Insufficient documentation

## 2023-08-09 DIAGNOSIS — I2510 Atherosclerotic heart disease of native coronary artery without angina pectoris: Secondary | ICD-10-CM | POA: Insufficient documentation

## 2023-08-09 DIAGNOSIS — I361 Nonrheumatic tricuspid (valve) insufficiency: Secondary | ICD-10-CM | POA: Diagnosis not present

## 2023-08-09 DIAGNOSIS — I272 Pulmonary hypertension, unspecified: Secondary | ICD-10-CM

## 2023-08-09 DIAGNOSIS — Z951 Presence of aortocoronary bypass graft: Secondary | ICD-10-CM | POA: Diagnosis not present

## 2023-08-09 DIAGNOSIS — J84112 Idiopathic pulmonary fibrosis: Secondary | ICD-10-CM | POA: Insufficient documentation

## 2023-08-09 DIAGNOSIS — I110 Hypertensive heart disease with heart failure: Secondary | ICD-10-CM | POA: Insufficient documentation

## 2023-08-09 DIAGNOSIS — Z5181 Encounter for therapeutic drug level monitoring: Secondary | ICD-10-CM

## 2023-08-09 DIAGNOSIS — F03A0 Unspecified dementia, mild, without behavioral disturbance, psychotic disturbance, mood disturbance, and anxiety: Secondary | ICD-10-CM | POA: Diagnosis not present

## 2023-08-09 DIAGNOSIS — G4733 Obstructive sleep apnea (adult) (pediatric): Secondary | ICD-10-CM | POA: Insufficient documentation

## 2023-08-09 LAB — BASIC METABOLIC PANEL
Anion gap: 8 (ref 5–15)
BUN: 33 mg/dL — ABNORMAL HIGH (ref 8–23)
CO2: 29 mmol/L (ref 22–32)
Calcium: 8.8 mg/dL — ABNORMAL LOW (ref 8.9–10.3)
Chloride: 101 mmol/L (ref 98–111)
Creatinine, Ser: 1.53 mg/dL — ABNORMAL HIGH (ref 0.61–1.24)
GFR, Estimated: 46 mL/min — ABNORMAL LOW (ref >60)
Glucose, Bld: 100 mg/dL — ABNORMAL HIGH (ref 70–99)
Potassium: 4 mmol/L (ref 3.5–5.1)
Sodium: 138 mmol/L (ref 135–145)

## 2023-08-09 LAB — BRAIN NATRIURETIC PEPTIDE: B Natriuretic Peptide: 137 pg/mL — ABNORMAL HIGH (ref 0.0–100.0)

## 2023-08-09 MED ORDER — PIRFENIDONE 267 MG PO TABS
534.0000 mg | ORAL_TABLET | Freq: Three times a day (TID) | ORAL | 3 refills | Status: DC
Start: 2023-08-09 — End: 2023-08-15

## 2023-08-09 NOTE — Addendum Note (Signed)
Encounter addended by: Andrey Farmer, PA-C on: 08/09/2023 5:17 PM  Actions taken: Clinical Note Signed

## 2023-08-09 NOTE — Telephone Encounter (Addendum)
Pirfenidone was refilled under me today and sent to the pharmacy. This medication is used for pulmonary fibrosis and is prescribed by his pulmonologist. This medication should not be managed by our clinic.   Please update the patient and the pharmacy. A new prescription needs to be submitted by the correct team.

## 2023-08-09 NOTE — Patient Instructions (Addendum)
Medication Changes:  No Changes In Medications at this time.   Lab Work:  Labs done today, your results will be available in MyChart, we will contact you for abnormal readings.  MESSAGE SENT TO HOME HEALTH- SOMEONE WILL REACH OUT TO YOU REGARDING THIS  LIMIT YOUR FLUID INTAKE TO 64OZ OF FLUID DAILY   Follow-Up in: 3 MONTHS   At the [facility], you and your health needs are our priority. We have a designated team specialized in the treatment of Heart Failure. This Care Team includes your primary Heart Failure Specialized Cardiologist (physician), Advanced Practice Providers (APPs- Physician Assistants and Nurse Practitioners), and Pharmacist who all work together to provide you with the care you need, when you need it.   You may see any of the following providers on your designated Care Team at your next follow up:  Dr. Arvilla Meres Dr. Marca Ancona Dr. Marcos Eke, NP Robbie Lis, [place] [facility] [place], [place] Brynda Peon, NP Karle Plumber, PharmD   Please be sure to bring in all your medications bottles to every appointment.   Need to Contact us:  If you have any questions or concerns before your next appointment please send [place] a message through [place] or call our office at [phone].    TO LEAVE A MESSAGE FOR THE NURSE SELECT OPTION 2, PLEASE LEAVE A MESSAGE INCLUDING: YOUR NAME DATE OF BIRTH CALL BACK NUMBER REASON FOR CALL**this is important as we prioritize the call backs  YOU WILL RECEIVE A CALL BACK THE SAME DAY AS LONG AS YOU CALL BEFORE 4:00 PM

## 2023-08-09 NOTE — Progress Notes (Addendum)
PCP: Merri Brunette, MD Cardiology: Dr. Jens Som HF Cardiology: Dr. Shirlee Latch  82 y.o. with history of permanent atrial fibrillation, CAD s/p CABG, St Jude PPM, pulmonary fibrosis, and chronic RV failure with severe TR was referred by Dr. Marchelle Gearing for evaluation of CHF/RV failure. Patient had CABG and Maze in 1/09.  In 8/13, he had a subdural hematoma while on warfarin, craniotomy done.  He has been off anticoagulation since that time.  His atrial fibrillation is now permanent.  He has a Secondary school teacher PPM for tachy/brady syndrome.  He is followed by Dr. Marchelle Gearing for interstitial lung disease, CT chest consistent with UIP.  He is not on anti-fibrotics.   Echoes going back to 2020 have shown RV dysfunction with severe TR. Most recent echo in 3/24 showed EF 55-60%, D-shaped septum with moderate RV enlargement and moderate RV dysfunction, severe TR, dilated IVC.  RHC was done in 6/24, showing mean RA 22 with prominent CV waves, PA 44/22 mean 33, mean PCWP 19, CI 2.29, PVR 3.1 WU, PAPi 1 => predominantly pulmonary venous hypertension with significant RV dysfunction.   Torsemide was increased to 40 mg bid.   Follow up 7/24, he was volume overloaded. Farxiga 10 mg daily and spiro 12.5 mg daily added, and torsemide increased to 60/40.    Last seen in clinic 07/13/23. He was volume overloaded. Torsemide increased to 60 mg BID. Referred to home health for placement of UNNA boots d/t fluid bilsters on RLE.  He is here today for follow-up. Reports UOP increased with higher dose of Torsemide. Home weight stable around 180-185. Drinks a lot of fluid - 4-5 cups of coffee a day, at least 2 large bottles of water and Cheerwine.  Ambulates with a cane. Notes some shortness of breath when walking up an incline, able to tolerate further distances on flat ground. Lower extremity edema tends to be worse when sitting and improves with leg elevation. Has compression stockings, they are uncomfortable so he does not wear them. Home  health never came out to his house.  Device interrogation (personally reviewed): 5% V paced, 86% Afib burden noted but has permanent afib   ECG (personally reviewed): none ordered today.  Labs (5/24): K 3.6, creatinine 1.37 Labs (7/24): K 4.8, creatinine 1.4 Labs (08/24): K 3.8, creatinine 1.65  PMH: 1. Atrial fibrillation: Maze in 1/09 with CABG.  AF now permanent. Not anticoagulated since subdural hemorrhage.  2. Subdural hematoma: 8/13, patient was on warfarin at the time.  Had craniotomy.  He has been off anticoagulation since then.  3. CAD: s/p CABG 1/09 with LIMA-LAD, SVG-OM2, SVG-PDA.  4. Interstitial lung disease: Pulmonary fibrosis, UIP pattern.  Sees Dr. Marchelle Gearing.  - CT chest (3/24): Fibrotic ILD.  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 DM2 9. HTN 10. Hyperlipidemia 11. Testicular cancer: s/p orchiectomy 12. OSA: CPAP 13. 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  - Echo (11/22): severe TR, RV dysfunction.  - Echo (3/24): EF 55-60%, D-shaped septum with moderate RV enlargement and moderate RV dysfunction, severe TR, dilated IVC.  - RHC (6/24): mean RA 22 with prominent CV waves, PA 44/22 mean 33, mean PCWP 19, CI 2.29, PVR 3.1 WU, PAPi 1 => predominantly pulmonary venous hypertension with significant RV dysfunction.   Family History  Problem Relation Age of Onset   Heart disease Father    Heart attack Father    Heart failure  Father    Heart disease Mother    Alzheimer's disease Mother    Dementia Mother    Social History   Socioeconomic History   Marital status: Married    Spouse name: Engineer, maintenance (IT)   Number of children: 2   Years of education: Not on file   Highest education level: Not on file  Occupational History   Occupation: Retired- Theatre stage manager  Tobacco Use   Smoking status: Former    Current packs/day: 0.00    Average packs/day: 2.0 packs/day for 20.0 years  (40.0 ttl pk-yrs)    Types: Cigarettes    Start date: 02/21/1971    Quit date: 02/21/1991    Years since quitting: 32.4   Smokeless tobacco: Never  Vaping Use   Vaping status: Never Used  Substance and Sexual Activity   Alcohol use: No    Alcohol/week: 0.0 standard drinks of alcohol   Drug use: No   Sexual activity: Not Currently  Other Topics Concern   Not on file  Social History Narrative   Lives with wife   Right handed    Married with two children.     He is a Emergency planning/management officer.     Social Determinants of Health   Financial Resource Strain: Not on file  Food Insecurity: Not on file  Transportation Needs: Not on file  Physical Activity: Not on file  Stress: Not on file  Social Connections: Not on file  Intimate Partner Violence: Not on file   ROS: All systems reviewed and negative except as per HPI.   Current Outpatient Medications  Medication Sig Dispense Refill   allopurinol (ZYLOPRIM) 300 MG tablet Take 1 tablet (300 mg total) by mouth daily. 30 tablet 0   colchicine 0.6 MG tablet TAKE 1 TO 3 TABLETS AS NEEDED DAILY     dapagliflozin propanediol (FARXIGA) 10 MG TABS tablet Take 1 tablet (10 mg total) by mouth daily before breakfast. 30 tablet 11   digoxin (LANOXIN) 0.125 MG tablet TAKE ONE TABLET BY MOUTH MONDAY THROUGH FRIDAY. DO NOT TAKE ON SATURDAY OR SUNDAY. (Patient taking differently: Take 0.125 mg by mouth See admin instructions.) 90 tablet 3   ezetimibe (ZETIA) 10 MG tablet Take 1 tablet (10 mg total) by mouth daily. 90 tablet 3   ferrous sulfate 325 (65 FE) MG tablet Take 1 tablet (325 mg total) by mouth daily with breakfast. 30 tablet 3   memantine (NAMENDA) 10 MG tablet Take 10 mg by mouth 2 (two) times daily.     metoprolol succinate (TOPROL-XL) 25 MG 24 hr tablet Take 25 mg by mouth daily. Pt taking 1/2 tablet daily     mirabegron ER (MYRBETRIQ) 50 MG TB24 tablet Take 1 tablet (50 mg total) by mouth daily. 30 tablet 3   modafinil (PROVIGIL) 100 MG tablet TAKE 1  TABLET BY MOUTH EVERY DAY 30 tablet 5   Multiple Vitamin (MULTIVITAMIN WITH MINERALS) TABS tablet Take 1 tablet by mouth daily.     NON FORMULARY Pt uses a cpap nightly     pantoprazole (PROTONIX) 40 MG tablet Take 1 tablet (40 mg total) by mouth daily. 30 tablet 0   Pirfenidone 267 MG TABS Take 1 tab three times daily for 7 days, then 2 tabs three times daily thereafter **low dose as maintenance** 159 tablet 0   rosuvastatin (CRESTOR) 40 MG tablet Take 1 tablet (40 mg total) by mouth daily. 90 tablet 2   sertraline (ZOLOFT) 100 MG tablet Take 2 tablets (200 mg  total) by mouth daily. (Patient taking differently: Take 100 mg by mouth daily. 1.5 tablets daily) 30 tablet 0   silver sulfADIAZINE (SILVADENE) 1 % cream APPLY 1 APPLICATION TOPICALLY DAILY (Patient taking differently: Apply 1 Application topically daily as needed.) 50 g 0   spironolactone (ALDACTONE) 25 MG tablet Take 0.5 tablets (12.5 mg total) by mouth daily. 45 tablet 3   tamsulosin (FLOMAX) 0.4 MG CAPS capsule Take 1 capsule (0.4 mg total) by mouth at bedtime. 30 capsule 1   torsemide (DEMADEX) 20 MG tablet Take 3 tablets (60 mg total) by mouth 2 (two) times daily. 180 tablet 6   Pirfenidone 267 MG TABS Take 2 tablets (534 mg total) by mouth 3 (three) times daily with meals. **low dose as maintenance** 180 tablet 3   Teriparatide, Recombinant, 620 MCG/2.48ML SOPN inject 20 mcg Subcutaneous Once a day (Patient not taking: Reported on 08/09/2023) 1 mL 11   No current facility-administered medications for this encounter.   Wt Readings from Last 3 Encounters:  08/09/23 84 kg (185 lb 3.2 oz)  07/13/23 83.9 kg (185 lb)  07/10/23 82.9 kg (182 lb 12.8 oz)   BP 104/60   Pulse 83   Ht 5\' 8"  (1.727 m)   Wt 84 kg (185 lb 3.2 oz)   SpO2 98%   BMI 28.16 kg/m  General:  Well appearing elderly male HEENT: normal Neck: supple. JVP elevated with prominent v waves in setting of severe TR. Cor: PMI nondisplaced. Regular rate & rhythm. No rubs,  gallops, + TR murmur. Lungs: clear Abdomen: soft, nontender, nondistended.  Extremities: no cyanosis, clubbing, rash, 1-2+ edema with chronic venous stasis changes Neuro: alert & orientedx3. Affect pleasant   Assessment/Plan: 1. Chronic diastolic CHF: With prominent RV failure and severe TR.  Echoes since 2020 have shown RV failure and severe TR.  Most recent echo in 3/24 showed EF 55-60%, D-shaped septum with moderate RV enlargement and moderate RV dysfunction, severe TR, dilated IVC.  RHC was done in 6/24, showing mean RA 22 with prominent CV waves, PA 44/22 mean 33, mean PCWP 19, CI 2.29, PVR 3.1 WU, PAPi 1 => predominantly pulmonary venous hypertension with significant RV dysfunction.  NYHA II/early III. Volume okay on exam. Continue increased dose of Torsemide at 60 mg BID. Labs today. We discussed cutting back fluid intake.  - Some of his lower extremity edema is d/t venous stasis, improves with elevation. Will follow-up on home health referral for UNNA boots.  - Continue spironolactone 12.5 mg daily. - Continue Farxiga 10 mg daily.  - Continue digoxin for RV support. Dig level (06/25/23) 0.4 2. 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  Cause uncertain, could be atrial functional TR with long-standing atrial fibrillation.  Suspect this has significantly worsened his RV failure.   - With his age, frailty, and dementia, I do not think there are options to surgically or percutaneously treat his TR.  - Focus on decongestion.  3. Pulmonary hypertension: Mild on RHC, predominantly pulmonary venous hypertension due to LV diastolic dysfunction.  - Treatment will be diuresis.  4. Interstitial lung disease: UIP, followed by Dr. Marchelle Gearing.  Now on pirfenidone. With pulmonary venous hypertension, not a candidate for Tyvaso.  5. Atrial fibrillation: Permanent.  Rate is controlled.  - Continue Toprol XL 25 daily and digoxin.  - He has been deemed not a candidate for anticoagulation after subdural  hemorrhage in the past.  6. 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   7. CAD: s/p CABG 1/09.  No chest pain.  - Continue Crestor and Zetia.  8. OSA: Continue CPAP.   Follow up 3 months with APP  [facility] [place] Ndiaye N PA-C 08/09/2023

## 2023-08-09 NOTE — Addendum Note (Signed)
Encounter addended by: Andrey Farmer, PA-C on: 08/09/2023 4:41 PM  Actions taken: Clinical Note Signed

## 2023-08-10 ENCOUNTER — Other Ambulatory Visit (HOSPITAL_COMMUNITY): Payer: Self-pay

## 2023-08-10 DIAGNOSIS — G4733 Obstructive sleep apnea (adult) (pediatric): Secondary | ICD-10-CM | POA: Diagnosis not present

## 2023-08-10 DIAGNOSIS — I4819 Other persistent atrial fibrillation: Secondary | ICD-10-CM | POA: Diagnosis not present

## 2023-08-10 DIAGNOSIS — E119 Type 2 diabetes mellitus without complications: Secondary | ICD-10-CM | POA: Diagnosis not present

## 2023-08-10 DIAGNOSIS — Z556 Problems related to health literacy: Secondary | ICD-10-CM | POA: Diagnosis not present

## 2023-08-10 DIAGNOSIS — I272 Pulmonary hypertension, unspecified: Secondary | ICD-10-CM | POA: Diagnosis not present

## 2023-08-10 DIAGNOSIS — Z8547 Personal history of malignant neoplasm of testis: Secondary | ICD-10-CM | POA: Diagnosis not present

## 2023-08-10 DIAGNOSIS — Z7984 Long term (current) use of oral hypoglycemic drugs: Secondary | ICD-10-CM | POA: Diagnosis not present

## 2023-08-10 DIAGNOSIS — E785 Hyperlipidemia, unspecified: Secondary | ICD-10-CM | POA: Diagnosis not present

## 2023-08-10 DIAGNOSIS — I2510 Atherosclerotic heart disease of native coronary artery without angina pectoris: Secondary | ICD-10-CM | POA: Diagnosis not present

## 2023-08-10 DIAGNOSIS — I071 Rheumatic tricuspid insufficiency: Secondary | ICD-10-CM | POA: Diagnosis not present

## 2023-08-10 DIAGNOSIS — I110 Hypertensive heart disease with heart failure: Secondary | ICD-10-CM | POA: Diagnosis not present

## 2023-08-10 DIAGNOSIS — I5032 Chronic diastolic (congestive) heart failure: Secondary | ICD-10-CM | POA: Diagnosis not present

## 2023-08-10 DIAGNOSIS — I495 Sick sinus syndrome: Secondary | ICD-10-CM | POA: Diagnosis not present

## 2023-08-10 DIAGNOSIS — F0390 Unspecified dementia without behavioral disturbance: Secondary | ICD-10-CM | POA: Diagnosis not present

## 2023-08-10 DIAGNOSIS — Z951 Presence of aortocoronary bypass graft: Secondary | ICD-10-CM | POA: Diagnosis not present

## 2023-08-10 DIAGNOSIS — Z87891 Personal history of nicotine dependence: Secondary | ICD-10-CM | POA: Diagnosis not present

## 2023-08-13 ENCOUNTER — Other Ambulatory Visit (HOSPITAL_COMMUNITY): Payer: Self-pay

## 2023-08-14 ENCOUNTER — Telehealth (HOSPITAL_COMMUNITY): Payer: Self-pay

## 2023-08-14 NOTE — Telephone Encounter (Signed)
Received fax from [facility] that they are unable to place Unna boots as requested on patient due to no open wounds or swelling noted.   Provider Prince Rome, NP) aware.

## 2023-08-15 NOTE — Telephone Encounter (Signed)
Updated.

## 2023-08-21 ENCOUNTER — Encounter: Payer: Self-pay | Admitting: Internal Medicine

## 2023-08-21 ENCOUNTER — Ambulatory Visit: Payer: Medicare Other | Attending: Internal Medicine | Admitting: Internal Medicine

## 2023-08-21 VITALS — BP 138/76 | HR 93 | Ht 68.0 in | Wt 185.0 lb

## 2023-08-21 DIAGNOSIS — R001 Bradycardia, unspecified: Secondary | ICD-10-CM | POA: Insufficient documentation

## 2023-08-21 DIAGNOSIS — Z5181 Encounter for therapeutic drug level monitoring: Secondary | ICD-10-CM | POA: Insufficient documentation

## 2023-08-21 DIAGNOSIS — I5032 Chronic diastolic (congestive) heart failure: Secondary | ICD-10-CM | POA: Diagnosis not present

## 2023-08-21 LAB — CUP PACEART INCLINIC DEVICE CHECK
Battery Remaining Longevity: 80 mo
Battery Voltage: 3.01 V
Brady Statistic RA Percent Paced: 3.9 %
Brady Statistic RV Percent Paced: 5.4 %
Date Time Interrogation Session: 20240903162433
Implantable Lead Connection Status: 753985
Implantable Lead Connection Status: 753985
Implantable Lead Implant Date: 20090420
Implantable Lead Implant Date: 20090420
Implantable Lead Location: 753859
Implantable Lead Location: 753860
Implantable Pulse Generator Implant Date: 20200708
Lead Channel Impedance Value: 462.5 Ohm
Lead Channel Impedance Value: 475 Ohm
Lead Channel Pacing Threshold Amplitude: 0.75 V
Lead Channel Pacing Threshold Amplitude: 0.75 V
Lead Channel Pacing Threshold Pulse Width: 0.4 ms
Lead Channel Pacing Threshold Pulse Width: 0.4 ms
Lead Channel Sensing Intrinsic Amplitude: 1 mV
Lead Channel Sensing Intrinsic Amplitude: 7 mV
Lead Channel Setting Pacing Amplitude: 2 V
Lead Channel Setting Pacing Amplitude: 2.5 V
Lead Channel Setting Pacing Pulse Width: 0.4 ms
Lead Channel Setting Sensing Sensitivity: 2 mV
Pulse Gen Model: 2272
Pulse Gen Serial Number: 9149180

## 2023-08-21 NOTE — Progress Notes (Signed)
HPI  Mr. Hogston returns today for followup. He is a pleasant 82 yo man with dementia, HTN, COPD, and PAF. He was in AT when I saw him in December and feeling poorly. He is now in atrial fib with a RVR.  He is back on digoxin and a beta blocker but his bp tends to run a little low. His dose of digoxin was increased to 0.125 mg 6 days a week. He still has palpitations. He remains a non-candidate for an OAC.     Current Outpatient Medications  Medication Sig Dispense Refill   allopurinol (ZYLOPRIM) 300 MG tablet Take 1 tablet (300 mg total) by mouth daily. 30 tablet 0   colchicine 0.6 MG tablet TAKE 1 TO 3 TABLETS AS NEEDED DAILY     dapagliflozin propanediol (FARXIGA) 10 MG TABS tablet Take 1 tablet (10 mg total) by mouth daily before breakfast. 30 tablet 11   digoxin (LANOXIN) 0.125 MG tablet TAKE ONE TABLET BY MOUTH MONDAY THROUGH FRIDAY. DO NOT TAKE ON SATURDAY OR SUNDAY. (Patient taking differently: Take 0.125 mg by mouth See admin instructions.) 90 tablet 3   ezetimibe (ZETIA) 10 MG tablet Take 1 tablet (10 mg total) by mouth daily. 90 tablet 3   ferrous sulfate 325 (65 FE) MG tablet Take 1 tablet (325 mg total) by mouth daily with breakfast. 30 tablet 3   memantine (NAMENDA) 10 MG tablet Take 10 mg by mouth 2 (two) times daily.     metoprolol succinate (TOPROL-XL) 25 MG 24 hr tablet Take 25 mg by mouth daily. Pt taking 1/2 tablet daily     mirabegron ER (MYRBETRIQ) 50 MG TB24 tablet Take 1 tablet (50 mg total) by mouth daily. 30 tablet 3   modafinil (PROVIGIL) 100 MG tablet TAKE 1 TABLET BY MOUTH EVERY DAY 30 tablet 5   Multiple Vitamin (MULTIVITAMIN WITH MINERALS) TABS tablet Take 1 tablet by mouth daily.     NON FORMULARY Pt uses a cpap nightly     pantoprazole (PROTONIX) 40 MG tablet Take 1 tablet (40 mg total) by mouth daily. 30 tablet 0   Pirfenidone 267 MG TABS Take 1 tab three times daily for 7 days, then 2 tabs three times daily thereafter **low dose as maintenance** 159  tablet 0   rosuvastatin (CRESTOR) 40 MG tablet Take 1 tablet (40 mg total) by mouth daily. 90 tablet 2   sertraline (ZOLOFT) 100 MG tablet Take 2 tablets (200 mg total) by mouth daily. (Patient taking differently: Take 100 mg by mouth daily. 1.5 tablets daily) 30 tablet 0   silver sulfADIAZINE (SILVADENE) 1 % cream APPLY 1 APPLICATION TOPICALLY DAILY (Patient taking differently: Apply 1 Application topically daily as needed.) 50 g 0   spironolactone (ALDACTONE) 25 MG tablet Take 0.5 tablets (12.5 mg total) by mouth daily. 45 tablet 3   tamsulosin (FLOMAX) 0.4 MG CAPS capsule Take 1 capsule (0.4 mg total) by mouth at bedtime. 30 capsule 1   Teriparatide, Recombinant, 620 MCG/2.48ML SOPN inject 20 mcg Subcutaneous Once a day 1 mL 11   torsemide (DEMADEX) 20 MG tablet Take 3 tablets (60 mg total) by mouth 2 (two) times daily. 180 tablet 6   No current facility-administered medications for this visit.     Past Medical History:  Diagnosis Date   (HFpEF) heart failure with preserved ejection fraction (HCC)    a. 05/2013 Echo: EF 55%, mild LVH, diast dysfxn, Ao sclerosis, mildly dil LA, RV dysfxn (poorly visualized), PASP  ;  b. 03/2017 Echo: EF 55-60%, no rwma, triv MR, mildly dil RV, mod TR, PASP .   Atrial fibrillation (HCC)    s/p Cox Maze 1/09;  Multaq Rx d/c'd in 2014 due to pulmo fibrosis;  coumadin d/c'd in 2014 due to spontaneous subdural hematoma   BPH (benign prostatic hyperplasia)    CAD (coronary artery disease), native coronary artery    a. s/p CABG 12/2007;  b. Myoview 12/2011: EF 66%, no scar or ischemia; normal.   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 Jude   Peripheral neuropathy 07/31/2018   Pulmonary fibrosis (HCC)    Multaq d/c'd 7/14   Subdural hematoma (HCC) 07/2012   spontaneous;  coumadin d/c'd => no  longer a candidate for anticoagulation    ROS:   All systems reviewed and negative except as noted in the HPI.   Past Surgical History:  Procedure Laterality Date   AMPUTATION Left 05/17/2019   Procedure: AMPUTATION LEFT FOURTH TOE;  Surgeon: Cammy Copa, MD;  Location: [facility] OR;  Service: Orthopedics;  Laterality: Left;   AMPUTATION TOE Right 02/06/2020   Procedure: AMPUTATION TOE;  Surgeon: Nadara Mustard, MD;  Location: [facility] OR;  Service: Orthopedics;  Laterality: Right;   AMPUTATION TOE Right 08/17/2020   Procedure: AMPUTATION TOE 4th toe;  Surgeon: [name], DPM;  Location: [facility] ORS;  Service: Podiatry;  Laterality: Right;   APPENDECTOMY     CHOLECYSTECTOMY     CORONARY ARTERY BYPASS GRAFT     x3 (left internal mammary artery to distal left anterior descending coronary artery, saphenous vain graft to second circumflex marginal branch, saphenous vain graft to posterior descending coronary artery, endoscopic saphenous vain harvest from right thigh) and modified Cox - Maze IV procedure.  [name]. [name],MD. Electronically signed CHO/MEDQ D: 01/09/2008/ JOB: [id number] cc:  Antionette Char MD   CRANIOTOMY  07/30/2012   Procedure: CRANIOTOMY HEMATOMA EVACUATION SUBDURAL;  Surgeon: Mariam Dollar, MD;  Location: [facility] NEURO ORS;  Service: Neurosurgery;  Laterality: Right;  Right craniotomy for evacuation of subdural hematoma   FOOT SURGERY     HERNIA REPAIR     INTRAMEDULLARY (IM) NAIL INTERTROCHANTERIC Right 02/04/2020   Procedure: INTRAMEDULLARY (IM) NAIL INTERTROCHANTRIC;  Surgeon: Beverely Low, MD;  Location: [facility] OR;  Service: Orthopedics;  Laterality: Right;   ORCHIECTOMY     Left  /  testicular cancer   PACEMAKER PLACEMENT     PPM - St. Jude   PPM GENERATOR CHANGEOUT N/A 06/25/2019   Procedure: PPM GENERATOR CHANGEOUT;  Surgeon: Marinus Maw, MD;  Location: [facility] INVASIVE CV LAB;  Service: Cardiovascular;  Laterality: N/A;   RIGHT HEART CATH N/A 05/22/2023   Procedure: RIGHT HEART  CATH;  Surgeon: Laurey Morale, MD;  Location: [facility] INVASIVE CV LAB;  Service: Cardiovascular;  Laterality: N/A;     Family History  Problem Relation Age of Onset   Heart disease Father    Heart attack Father    Heart failure Father    Heart disease Mother    Alzheimer's disease Mother    Dementia Mother      Social History   Socioeconomic History   Marital status: Married    Spouse name: Noel Journey   Number of children: 2   Years of  education: Not on file   Highest education level: Not on file  Occupational History   Occupation: Retired- Theatre stage manager  Tobacco Use   Smoking status: Former    Current packs/day: 0.00    Average packs/day: 2.0 packs/day for 20.0 years (40.0 ttl pk-yrs)    Types: Cigarettes    Start date: 02/21/1971    Quit date: 02/21/1991    Years since quitting: 32.5   Smokeless tobacco: Never  Vaping Use   Vaping status: Never Used  Substance and Sexual Activity   Alcohol use: No    Alcohol/week: 0.0 standard drinks of alcohol   Drug use: No   Sexual activity: Not Currently  Other Topics Concern   Not on file  Social History Narrative   Lives with wife   Right handed    Married with two children.     He is a Emergency planning/management officer.     Social Determinants of Health   Financial Resource Strain: Not on file  Food Insecurity: Not on file  Transportation Needs: Not on file  Physical Activity: Not on file  Stress: Not on file  Social Connections: Not on file  Intimate Partner Violence: Not on file     BP 138/76   Pulse 93   Ht 5\' 8"  (1.727 m)   Wt 185 lb (83.9 kg)   SpO2 96%   BMI 28.13 kg/m   Physical Exam:  Well appearing NAD HEENT: Unremarkable Neck:  No JVD, no thyromegally Lymphatics:  No adenopathy Back:  No CVA tenderness Lungs:  Clear with no wheezes HEART:  Regular rate rhythm, no murmurs, no rubs, no clicks Abd:  soft, positive bowel sounds, no organomegally, no rebound, no guarding Ext:  2 plus pulses, no edema, no cyanosis, no  clubbing Skin:  No rashes no nodules Neuro:  CN II through XII intact, motor grossly intact  DEVICE  Normal device function.  See PaceArt for details.   Assess/Plan:  Atrial tachy - he is better. I asked him to continue the beta blocker and increase the digoxin daily 6 days a week.  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 Atrial fib - he has reverted back to atrial fib and his rates are not well controlled. We will increase the digoxin and increase the dose to 7 times a week. We will recheck his dig level in 3 weeks if his level today is low on 6 doses a week. CAD - he denies anginal symptoms.  Diastolic heart failure - his symptoms are now class 2 from 3. He will continue his current meds.   Sharlot Gowda Korinne Greenstein,MD

## 2023-08-21 NOTE — Patient Instructions (Addendum)
Medication Instructions:  Your physician recommends that you continue on your current medications as directed. Please refer to the Current Medication list given to you today.  *If you need a refill on your cardiac medications before your next appointment, please call your pharmacy*  Lab Work: Digoxin level -- TODAY  If you have labs (blood work) drawn today and your tests are completely normal, you will receive your results only by: MyChart Message (if you have MyChart) OR A paper copy in the mail If you have any lab test that is abnormal or we need to change your treatment, we will call you to review the results.  Testing/Procedures: None ordered.  Follow-Up: At [facility], you and your health needs are our priority.  As part of our continuing mission to provide you with exceptional heart care, we have created designated Provider Care Teams.  These Care Teams include your primary Cardiologist (physician) and Advanced Practice Providers (APPs -  Physician Assistants and Nurse Practitioners) who all work together to provide you with the care you need, when you need it.   Your next appointment:   1 year(s)  The format for your next appointment:   In Person  Provider:   Lewayne Bunting, MD{or one of the following Advanced Practice Providers on your designated Care Team:   Francis Dowse, [place] Casimiro Needle "Mardelle Matte" [place], [place] Earnest Rosier, NP  Important Information About Sugar

## 2023-08-24 DIAGNOSIS — E119 Type 2 diabetes mellitus without complications: Secondary | ICD-10-CM | POA: Diagnosis not present

## 2023-08-24 DIAGNOSIS — I4819 Other persistent atrial fibrillation: Secondary | ICD-10-CM | POA: Diagnosis not present

## 2023-08-24 DIAGNOSIS — I272 Pulmonary hypertension, unspecified: Secondary | ICD-10-CM | POA: Diagnosis not present

## 2023-08-24 DIAGNOSIS — I5032 Chronic diastolic (congestive) heart failure: Secondary | ICD-10-CM | POA: Diagnosis not present

## 2023-08-24 DIAGNOSIS — F0390 Unspecified dementia without behavioral disturbance: Secondary | ICD-10-CM | POA: Diagnosis not present

## 2023-08-24 DIAGNOSIS — I110 Hypertensive heart disease with heart failure: Secondary | ICD-10-CM | POA: Diagnosis not present

## 2023-08-24 LAB — DIGOXIN LEVEL: Digoxin, Serum: 0.7 ng/mL (ref 0.5–0.9)

## 2023-08-27 ENCOUNTER — Ambulatory Visit (INDEPENDENT_AMBULATORY_CARE_PROVIDER_SITE_OTHER): Payer: Medicare Other | Admitting: Podiatry

## 2023-08-27 ENCOUNTER — Other Ambulatory Visit (HOSPITAL_COMMUNITY): Payer: Self-pay

## 2023-08-27 DIAGNOSIS — L97921 Non-pressure chronic ulcer of unspecified part of left lower leg limited to breakdown of skin: Secondary | ICD-10-CM

## 2023-08-27 DIAGNOSIS — L84 Corns and callosities: Secondary | ICD-10-CM | POA: Diagnosis not present

## 2023-08-27 NOTE — Progress Notes (Signed)
Subjective: Chief Complaint  Patient presents with   Nail Problem    Nail trim     82 year old male presents the office today for evaluation of preulcerative callus on the right foot submetatarsal 1.  He does not report any drainage or pus.  No swelling or redness.  He has not had any fevers or chills.  He has no new concerns today.  His wife is accompanying him today.    Objective: AAO x3, NAD DP/PT pulses palpable 1/4 b/l, CRT less than 3 seconds Sensation decreased with Semmes Weinstein monofilament. Thick callus with dried blood present right foot submetatarsal 1.  Upon debridement there is no underlying ulceration, drainage or any signs of infection but the area is preulcerative.  No signs of infection today. Superficial abrasion to the left leg without any surrounding erythema, ascending cellulitis.  There is no fluctuation or crepitation but there is no malodor. No pain with calf compression, swelling, warmth, erythema   Assessment: Preulcerative lesion right foot, abrasion leg  Plan: Excisional debride the hyperkeratotic lesion with any complications or bleeding.  The area is preulcerative.  She is to monitor daily for any signs or symptoms of infection or skin breakdown.  Continue offloading.  Moisturizer.  In regards to the leg discussed antibiotic ointment dressing changes daily.  If not healed next 1 to 2 weeks to let us know or sooner if there is any worsening or changes.  Monitor closely for any signs or symptoms of infection.  [name] DPM

## 2023-08-29 ENCOUNTER — Other Ambulatory Visit (HOSPITAL_COMMUNITY): Payer: Self-pay

## 2023-08-29 DIAGNOSIS — I5032 Chronic diastolic (congestive) heart failure: Secondary | ICD-10-CM | POA: Diagnosis not present

## 2023-08-29 DIAGNOSIS — E119 Type 2 diabetes mellitus without complications: Secondary | ICD-10-CM | POA: Diagnosis not present

## 2023-08-29 DIAGNOSIS — I110 Hypertensive heart disease with heart failure: Secondary | ICD-10-CM | POA: Diagnosis not present

## 2023-08-29 DIAGNOSIS — I272 Pulmonary hypertension, unspecified: Secondary | ICD-10-CM | POA: Diagnosis not present

## 2023-08-29 DIAGNOSIS — F0390 Unspecified dementia without behavioral disturbance: Secondary | ICD-10-CM | POA: Diagnosis not present

## 2023-08-29 DIAGNOSIS — I4819 Other persistent atrial fibrillation: Secondary | ICD-10-CM | POA: Diagnosis not present

## 2023-08-30 ENCOUNTER — Other Ambulatory Visit: Payer: Self-pay | Admitting: Internal Medicine

## 2023-08-30 ENCOUNTER — Other Ambulatory Visit (HOSPITAL_COMMUNITY): Payer: Self-pay

## 2023-08-30 DIAGNOSIS — Z5181 Encounter for therapeutic drug level monitoring: Secondary | ICD-10-CM

## 2023-08-30 DIAGNOSIS — J84112 Idiopathic pulmonary fibrosis: Secondary | ICD-10-CM

## 2023-08-30 MED ORDER — PIRFENIDONE 267 MG PO TABS
534.0000 mg | ORAL_TABLET | Freq: Three times a day (TID) | ORAL | 5 refills | Status: DC
Start: 2023-08-30 — End: 2024-09-01
  Filled 2023-08-30: qty 180, 30d supply, fill #0
  Filled 2023-09-19: qty 180, 30d supply, fill #1
  Filled 2023-10-29: qty 180, 30d supply, fill #2
  Filled 2023-12-13: qty 180, 30d supply, fill #3
  Filled 2024-01-03: qty 180, 30d supply, fill #4

## 2023-08-30 NOTE — Telephone Encounter (Signed)
Patient due repeat LFTs while on pirfenidone. Spoke with wife - she is coming to our infusion center tomorrow and will plan bring him along for his bloodwork to be completed.  CMET order placed.  Also advised him to schedule f/u apt with Dr. Marchelle Gearing at check out desk while at clinic  [name], PharmD, MPH, BCPS, CPP Clinical Pharmacist (Rheumatology and Pulmonology)

## 2023-08-31 ENCOUNTER — Other Ambulatory Visit (HOSPITAL_COMMUNITY): Payer: Self-pay

## 2023-08-31 NOTE — Progress Notes (Signed)
HPI: FU CAD, CHF and atrial fibrillation. He underwent CABG (LIMA-LAD, SVG-OM 2, SVG-PDA) along with modified Cox Maze IV procedure in 12/2007. 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 Abdominal US 3/12: No aneurysm. 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 He underwent craniotomy and evacuation by Dr. Wynetta Emery. He was taken off of Coumadin and no longer felt to be an anticoagulation candidate. Multaq DCed previously as felt causing pulmonary toxixity. Nuclear study 5/18 showed EF 59 with normal perfusion. Carotid Dopplers June 2020 showed 1 to 39% bilateral stenosis. Transcranial Dopplers June 2020 negative. Chest CT August 2022 showed pulmonary fibrosis, probable cirrhosis, enlarged spleen and enlarged pulmonary trunk suggestive of pulmonary hypertension. Monitor November 2022 showed atrial fibrillation rate mildly elevated. Metoprolol was increased. Patient seen by Dr. Ladona Ridgel August 2023 and digoxin increased for improved rate control.  ABIs November 2023 normal.  Echocardiogram March 2024 showed normal LV function, D-shaped septum, moderate RV dysfunction, moderate right ventricular enlargement, mild left atrial enlargement, moderate right atrial enlargement, severe tricuspid regurgitation.  Right heart catheterization June 2024 showed mild pulmonary hypertension, mildly elevated pulmonary capillary wedge pressure and prominent V waves in the right atrial pressure tracing suggestive of significant tricuspid regurgitation.  Since last seen, he has mild dyspnea on exertion but no orthopnea, PND, chest pain or syncope.  He has minimal peripheral edema.  Current Outpatient Medications  Medication Sig Dispense Refill   allopurinol (ZYLOPRIM) 300 MG tablet Take 1 tablet (300 mg total) by mouth daily. 30 tablet 0   dapagliflozin propanediol (FARXIGA) 10 MG TABS tablet Take 1 tablet (10 mg total) by mouth daily before breakfast. 30 tablet  11   digoxin (LANOXIN) 0.125 MG tablet TAKE ONE TABLET BY MOUTH MONDAY THROUGH FRIDAY. DO NOT TAKE ON SATURDAY OR SUNDAY. (Patient taking differently: Take 0.125 mg by mouth See admin instructions.) 90 tablet 3   doxycycline (VIBRAMYCIN) 100 MG capsule Take 100 mg by mouth 2 (two) times daily.     ezetimibe (ZETIA) 10 MG tablet Take 1 tablet (10 mg total) by mouth daily. 90 tablet 3   ferrous sulfate 325 (65 FE) MG tablet Take 1 tablet (325 mg total) by mouth daily with breakfast. 30 tablet 3   memantine (NAMENDA) 10 MG tablet Take 10 mg by mouth 2 (two) times daily.     metoprolol succinate (TOPROL-XL) 25 MG 24 hr tablet Take 25 mg by mouth daily. Pt taking 1/2 tablet daily     mirabegron ER (MYRBETRIQ) 50 MG TB24 tablet Take 1 tablet (50 mg total) by mouth daily. 30 tablet 3   modafinil (PROVIGIL) 100 MG tablet TAKE 1 TABLET BY MOUTH EVERY DAY 30 tablet 5   Multiple Vitamin (MULTIVITAMIN WITH MINERALS) TABS tablet Take 1 tablet by mouth daily.     NON FORMULARY Pt uses a cpap nightly     pantoprazole (PROTONIX) 40 MG tablet Take 1 tablet (40 mg total) by mouth daily. 30 tablet 0   Pirfenidone 267 MG TABS Take 2 tablets (534 mg total) by mouth 3 (three) times daily with meals. **low dose as maintenance** 180 tablet 5   rosuvastatin (CRESTOR) 40 MG tablet Take 1 tablet (40 mg total) by mouth daily. 90 tablet 2   sertraline (ZOLOFT) 100 MG tablet Take 2 tablets (200 mg total) by mouth daily. (Patient taking differently: Take 100 mg by mouth daily. 1.5 tablets daily) 30 tablet 0   silver sulfADIAZINE (SILVADENE) 1 % cream APPLY 1 APPLICATION  TOPICALLY DAILY (Patient taking differently: Apply 1 Application topically daily as needed.) 50 g 0   spironolactone (ALDACTONE) 25 MG tablet Take 0.5 tablets (12.5 mg total) by mouth daily. 45 tablet 3   tamsulosin (FLOMAX) 0.4 MG CAPS capsule Take 1 capsule (0.4 mg total) by mouth at bedtime. 30 capsule 1   Teriparatide, Recombinant, 620 MCG/2.48ML SOPN inject  20 mcg Subcutaneous Once a day 1 mL 11   torsemide (DEMADEX) 20 MG tablet Take 3 tablets (60 mg total) by mouth 2 (two) times daily. 180 tablet 6   colchicine 0.6 MG tablet TAKE 1 TO 3 TABLETS AS NEEDED DAILY (Patient not taking: Reported on 09/11/2023)     No current facility-administered medications for this visit.     Past Medical History:  Diagnosis Date   (HFpEF) heart failure with preserved ejection fraction (HCC)    a. 05/2013 Echo: EF 55%, mild LVH, diast dysfxn, Ao sclerosis, mildly dil LA, RV dysfxn (poorly visualized), PASP ;  b. 03/2017 Echo: EF 55-60%, no rwma, triv MR, mildly dil RV, mod TR, PASP .   Atrial fibrillation (HCC)    s/p Cox Maze 1/09;  Multaq Rx d/c'd in 2014 due to pulmo fibrosis;  coumadin d/c'd in 2014 due to spontaneous subdural hematoma   BPH (benign prostatic hyperplasia)    CAD (coronary artery disease), native coronary artery    a. s/p CABG 12/2007;  b. Myoview 12/2011: EF 66%, no scar or ischemia; normal.   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 Jude   Peripheral neuropathy 07/31/2018   Pulmonary fibrosis (HCC)    Multaq d/c'd 7/14   Subdural hematoma (HCC) 07/2012   spontaneous;  coumadin d/c'd => no longer a candidate for anticoagulation    Past Surgical History:  Procedure Laterality Date   AMPUTATION Left 05/17/2019   Procedure: AMPUTATION LEFT FOURTH TOE;  Surgeon: Cammy Copa, MD;  Location: [facility] OR;  Service: Orthopedics;  Laterality: Left;   AMPUTATION TOE Right 02/06/2020   Procedure: AMPUTATION TOE;  Surgeon: Nadara Mustard, MD;  Location: [facility] OR;  Service: Orthopedics;  Laterality: Right;   AMPUTATION TOE Right 08/17/2020   Procedure: AMPUTATION TOE 4th toe;  Surgeon: [name], DPM;  Location: [facility] ORS;  Service: Podiatry;  Laterality: Right;    APPENDECTOMY     CHOLECYSTECTOMY     CORONARY ARTERY BYPASS GRAFT     x3 (left internal mammary artery to distal left anterior descending coronary artery, saphenous vain graft to second circumflex marginal branch, saphenous vain graft to posterior descending coronary artery, endoscopic saphenous vain harvest from right thigh) and modified Cox - Maze IV procedure.  [name]. [name],MD. Electronically signed CHO/MEDQ D: 01/09/2008/ JOB: [id number] cc:  Antionette Char MD   CRANIOTOMY  07/30/2012   Procedure: CRANIOTOMY HEMATOMA EVACUATION SUBDURAL;  Surgeon: Mariam Dollar, MD;  Location: [facility] OR[facility];  Service: Neurosurgery;  Laterality: Right;  Right craniotomy for evacuation of subdural hematoma   FOOT SURGERY     HERNIA REPAIR     INTRAMEDULLARY (IM) NAIL INTERTROCHANTERIC Right 02/04/2020   Procedure: INTRAMEDULLARY (IM) NAIL INTERTROCHANTRIC;  Surgeon: Beverely Low, MD;  Location: [facility] OR;  Service: Orthopedics;  Laterality: Right;   ORCHIECTOMY     Left  /  testicular cancer  PACEMAKER PLACEMENT     PPM - St. Jude   PPM GENERATOR CHANGEOUT N/A 06/25/2019   Procedure: PPM GENERATOR CHANGEOUT;  Surgeon: Marinus Maw, MD;  Location: [facility] INVASIVE CV LAB;  Service: Cardiovascular;  Laterality: N/A;   RIGHT HEART CATH N/A 05/22/2023   Procedure: RIGHT HEART CATH;  Surgeon: Laurey Morale, MD;  Location: [facility] INVASIVE CV LAB;  Service: Cardiovascular;  Laterality: N/A;    Social History   Socioeconomic History   Marital status: Married    Spouse name: Engineer, maintenance (IT)   Number of children: 2   Years of education: Not on file   Highest education level: Not on file  Occupational History   Occupation: Retired- Theatre stage manager  Tobacco Use   Smoking status: Former    Current packs/day: 0.00    Average packs/day: 2.0 packs/day for 20.0 years (40.0 ttl pk-yrs)    Types: Cigarettes    Start date: 02/21/1971    Quit date: 02/21/1991    Years since quitting: 32.5   Smokeless tobacco: Never  Vaping Use   Vaping  status: Never Used  Substance and Sexual Activity   Alcohol use: No    Alcohol/week: 0.0 standard drinks of alcohol   Drug use: No   Sexual activity: Not Currently  Other Topics Concern   Not on file  Social History Narrative   Lives with wife   Right handed    Married with two children.     He is a Emergency planning/management officer.     Social Determinants of Health   Financial Resource Strain: Not on file  Food Insecurity: Not on file  Transportation Needs: Not on file  Physical Activity: Not on file  Stress: Not on file  Social Connections: Not on file  Intimate Partner Violence: Not on file    Family History  Problem Relation Age of Onset   Heart disease Father    Heart attack Father    Heart failure Father    Heart disease Mother    Alzheimer's disease Mother    Dementia Mother     ROS: no fevers or chills, productive cough, hemoptysis, dysphasia, odynophagia, melena, hematochezia, dysuria, hematuria, rash, seizure activity, orthopnea, PND, pedal edema, claudication. Remaining systems are negative.  Physical Exam: Well-developed well-nourished in no acute distress.  Skin is warm and dry.  HEENT is normal.  Neck is supple.  Chest is clear to auscultation with normal expansion.  Cardiovascular exam is irregular Abdominal exam nontender or distended. No masses palpated. Extremities show trace edema. neuro grossly intact  A/P  1 chronic diastolic/right heart failure-this is felt secondary to a combination of pulmonary venous hypertension as well as pulmonary fibrosis/sleep apnea.  He appears to be euvolemic.  Continue diuretic at present dose.  Continue Farxiga and spironolactone.  2 persistent atrial fibrillation-continue beta-blocker and digoxin for rate control.  Not a candidate for anticoagulation given spontaneous subdural hematoma history.  3 coronary artery disease-patient denies chest pain.  Continue statin.  No aspirin given history of spontaneous subdural hematoma.  4  hyperlipidemia-continue statin.  5 history of ectopic atrial tachycardia-continue beta-blocker.  6 pacemaker-followed by Dr. Ladona Ridgel.  7 tricuspid regurgitation-severe on most recent echocardiogram.  This is felt likely secondary to prior pacemaker versus right ventricular enlargement from pulmonary hypertension.  Conservative measures given age and comorbidities.  8 cirrhosis/pulmonary fibrosis/chronic anemia/thrombocytopenia-Per primary care.  Olga Millers, MD

## 2023-09-03 ENCOUNTER — Other Ambulatory Visit (HOSPITAL_COMMUNITY): Payer: Self-pay

## 2023-09-04 DIAGNOSIS — M8589 Other specified disorders of bone density and structure, multiple sites: Secondary | ICD-10-CM | POA: Diagnosis not present

## 2023-09-05 DIAGNOSIS — F0390 Unspecified dementia without behavioral disturbance: Secondary | ICD-10-CM | POA: Diagnosis not present

## 2023-09-05 DIAGNOSIS — I4819 Other persistent atrial fibrillation: Secondary | ICD-10-CM | POA: Diagnosis not present

## 2023-09-05 DIAGNOSIS — I110 Hypertensive heart disease with heart failure: Secondary | ICD-10-CM | POA: Diagnosis not present

## 2023-09-05 DIAGNOSIS — E119 Type 2 diabetes mellitus without complications: Secondary | ICD-10-CM | POA: Diagnosis not present

## 2023-09-05 DIAGNOSIS — I5032 Chronic diastolic (congestive) heart failure: Secondary | ICD-10-CM | POA: Diagnosis not present

## 2023-09-05 DIAGNOSIS — I272 Pulmonary hypertension, unspecified: Secondary | ICD-10-CM | POA: Diagnosis not present

## 2023-09-06 DIAGNOSIS — I10 Essential (primary) hypertension: Secondary | ICD-10-CM | POA: Diagnosis not present

## 2023-09-06 DIAGNOSIS — E7800 Pure hypercholesterolemia, unspecified: Secondary | ICD-10-CM | POA: Diagnosis not present

## 2023-09-06 DIAGNOSIS — E1121 Type 2 diabetes mellitus with diabetic nephropathy: Secondary | ICD-10-CM | POA: Diagnosis not present

## 2023-09-06 DIAGNOSIS — D509 Iron deficiency anemia, unspecified: Secondary | ICD-10-CM | POA: Diagnosis not present

## 2023-09-06 DIAGNOSIS — M810 Age-related osteoporosis without current pathological fracture: Secondary | ICD-10-CM | POA: Diagnosis not present

## 2023-09-06 DIAGNOSIS — Z8673 Personal history of transient ischemic attack (TIA), and cerebral infarction without residual deficits: Secondary | ICD-10-CM | POA: Diagnosis not present

## 2023-09-06 DIAGNOSIS — I5042 Chronic combined systolic (congestive) and diastolic (congestive) heart failure: Secondary | ICD-10-CM | POA: Diagnosis not present

## 2023-09-06 DIAGNOSIS — Z8679 Personal history of other diseases of the circulatory system: Secondary | ICD-10-CM | POA: Diagnosis not present

## 2023-09-07 LAB — LAB REPORT - SCANNED
A1c: 5.5
Albumin, Urine POC: 3
Creatinine, POC: 33.2 mg/dL
EGFR: 39
Microalb Creat Ratio: 9

## 2023-09-09 DIAGNOSIS — I272 Pulmonary hypertension, unspecified: Secondary | ICD-10-CM | POA: Diagnosis not present

## 2023-09-09 DIAGNOSIS — I495 Sick sinus syndrome: Secondary | ICD-10-CM | POA: Diagnosis not present

## 2023-09-09 DIAGNOSIS — I4819 Other persistent atrial fibrillation: Secondary | ICD-10-CM | POA: Diagnosis not present

## 2023-09-09 DIAGNOSIS — Z951 Presence of aortocoronary bypass graft: Secondary | ICD-10-CM | POA: Diagnosis not present

## 2023-09-09 DIAGNOSIS — I071 Rheumatic tricuspid insufficiency: Secondary | ICD-10-CM | POA: Diagnosis not present

## 2023-09-09 DIAGNOSIS — I5032 Chronic diastolic (congestive) heart failure: Secondary | ICD-10-CM | POA: Diagnosis not present

## 2023-09-09 DIAGNOSIS — I110 Hypertensive heart disease with heart failure: Secondary | ICD-10-CM | POA: Diagnosis not present

## 2023-09-09 DIAGNOSIS — Z87891 Personal history of nicotine dependence: Secondary | ICD-10-CM | POA: Diagnosis not present

## 2023-09-09 DIAGNOSIS — E119 Type 2 diabetes mellitus without complications: Secondary | ICD-10-CM | POA: Diagnosis not present

## 2023-09-09 DIAGNOSIS — E785 Hyperlipidemia, unspecified: Secondary | ICD-10-CM | POA: Diagnosis not present

## 2023-09-09 DIAGNOSIS — Z8547 Personal history of malignant neoplasm of testis: Secondary | ICD-10-CM | POA: Diagnosis not present

## 2023-09-09 DIAGNOSIS — G4733 Obstructive sleep apnea (adult) (pediatric): Secondary | ICD-10-CM | POA: Diagnosis not present

## 2023-09-09 DIAGNOSIS — F0390 Unspecified dementia without behavioral disturbance: Secondary | ICD-10-CM | POA: Diagnosis not present

## 2023-09-09 DIAGNOSIS — Z556 Problems related to health literacy: Secondary | ICD-10-CM | POA: Diagnosis not present

## 2023-09-09 DIAGNOSIS — Z7984 Long term (current) use of oral hypoglycemic drugs: Secondary | ICD-10-CM | POA: Diagnosis not present

## 2023-09-09 DIAGNOSIS — I2510 Atherosclerotic heart disease of native coronary artery without angina pectoris: Secondary | ICD-10-CM | POA: Diagnosis not present

## 2023-09-11 ENCOUNTER — Ambulatory Visit: Payer: Medicare Other | Attending: Cardiology | Admitting: Cardiology

## 2023-09-11 ENCOUNTER — Encounter: Payer: Self-pay | Admitting: Cardiology

## 2023-09-11 VITALS — BP 100/64 | HR 70 | Ht 64.5 in | Wt 179.6 lb

## 2023-09-11 DIAGNOSIS — Z950 Presence of cardiac pacemaker: Secondary | ICD-10-CM | POA: Insufficient documentation

## 2023-09-11 DIAGNOSIS — I4821 Permanent atrial fibrillation: Secondary | ICD-10-CM | POA: Diagnosis not present

## 2023-09-11 DIAGNOSIS — I361 Nonrheumatic tricuspid (valve) insufficiency: Secondary | ICD-10-CM | POA: Diagnosis not present

## 2023-09-11 DIAGNOSIS — I5032 Chronic diastolic (congestive) heart failure: Secondary | ICD-10-CM | POA: Insufficient documentation

## 2023-09-11 DIAGNOSIS — Z951 Presence of aortocoronary bypass graft: Secondary | ICD-10-CM | POA: Diagnosis not present

## 2023-09-11 NOTE — Patient Instructions (Signed)

## 2023-09-13 DIAGNOSIS — I4819 Other persistent atrial fibrillation: Secondary | ICD-10-CM | POA: Diagnosis not present

## 2023-09-13 DIAGNOSIS — I5032 Chronic diastolic (congestive) heart failure: Secondary | ICD-10-CM | POA: Diagnosis not present

## 2023-09-13 DIAGNOSIS — F0390 Unspecified dementia without behavioral disturbance: Secondary | ICD-10-CM | POA: Diagnosis not present

## 2023-09-13 DIAGNOSIS — I110 Hypertensive heart disease with heart failure: Secondary | ICD-10-CM | POA: Diagnosis not present

## 2023-09-13 DIAGNOSIS — I272 Pulmonary hypertension, unspecified: Secondary | ICD-10-CM | POA: Diagnosis not present

## 2023-09-13 DIAGNOSIS — E119 Type 2 diabetes mellitus without complications: Secondary | ICD-10-CM | POA: Diagnosis not present

## 2023-09-18 ENCOUNTER — Ambulatory Visit: Payer: Medicare Other

## 2023-09-18 DIAGNOSIS — R001 Bradycardia, unspecified: Secondary | ICD-10-CM

## 2023-09-18 DIAGNOSIS — Z950 Presence of cardiac pacemaker: Secondary | ICD-10-CM

## 2023-09-18 LAB — CUP PACEART REMOTE DEVICE CHECK
Battery Remaining Longevity: 71 mo
Battery Remaining Percentage: 62 %
Battery Voltage: 3.01 V
Brady Statistic AP VP Percent: 12 %
Brady Statistic AP VS Percent: 4.8 %
Brady Statistic AS VP Percent: 1.8 %
Brady Statistic AS VS Percent: 54 %
Brady Statistic RA Percent Paced: 4.4 %
Brady Statistic RV Percent Paced: 5.9 %
Date Time Interrogation Session: 20241001050913
Implantable Lead Connection Status: 753985
Implantable Lead Connection Status: 753985
Implantable Lead Implant Date: 20090420
Implantable Lead Implant Date: 20090420
Implantable Lead Location: 753859
Implantable Lead Location: 753860
Implantable Pulse Generator Implant Date: 20200708
Lead Channel Impedance Value: 440 Ohm
Lead Channel Impedance Value: 450 Ohm
Lead Channel Pacing Threshold Amplitude: 0.75 V
Lead Channel Pacing Threshold Amplitude: 1 V
Lead Channel Pacing Threshold Pulse Width: 0.4 ms
Lead Channel Pacing Threshold Pulse Width: 0.4 ms
Lead Channel Sensing Intrinsic Amplitude: 1.4 mV
Lead Channel Sensing Intrinsic Amplitude: 6 mV
Lead Channel Setting Pacing Amplitude: 2 V
Lead Channel Setting Pacing Amplitude: 2.5 V
Lead Channel Setting Pacing Pulse Width: 0.4 ms
Lead Channel Setting Sensing Sensitivity: 2 mV
Pulse Gen Model: 2272
Pulse Gen Serial Number: 9149180

## 2023-09-19 ENCOUNTER — Other Ambulatory Visit: Payer: Self-pay

## 2023-09-19 DIAGNOSIS — I110 Hypertensive heart disease with heart failure: Secondary | ICD-10-CM | POA: Diagnosis not present

## 2023-09-19 DIAGNOSIS — I4819 Other persistent atrial fibrillation: Secondary | ICD-10-CM | POA: Diagnosis not present

## 2023-09-19 DIAGNOSIS — E119 Type 2 diabetes mellitus without complications: Secondary | ICD-10-CM | POA: Diagnosis not present

## 2023-09-19 DIAGNOSIS — I272 Pulmonary hypertension, unspecified: Secondary | ICD-10-CM | POA: Diagnosis not present

## 2023-09-19 DIAGNOSIS — F0390 Unspecified dementia without behavioral disturbance: Secondary | ICD-10-CM | POA: Diagnosis not present

## 2023-09-19 DIAGNOSIS — I5032 Chronic diastolic (congestive) heart failure: Secondary | ICD-10-CM | POA: Diagnosis not present

## 2023-09-19 NOTE — Progress Notes (Signed)
Specialty Pharmacy Refill Coordination Note  [name]. is a 82 y.o. male contacted today regarding refills of specialty medication(s) Pirfenidone   Patient requested Delivery   Delivery date: 10/01/23   Verified address: [address], [place], [address]   Medication will be filled on 09/28/2023.

## 2023-09-25 DIAGNOSIS — F0390 Unspecified dementia without behavioral disturbance: Secondary | ICD-10-CM | POA: Diagnosis not present

## 2023-09-25 DIAGNOSIS — I5032 Chronic diastolic (congestive) heart failure: Secondary | ICD-10-CM | POA: Diagnosis not present

## 2023-09-25 DIAGNOSIS — I110 Hypertensive heart disease with heart failure: Secondary | ICD-10-CM | POA: Diagnosis not present

## 2023-09-25 DIAGNOSIS — I4819 Other persistent atrial fibrillation: Secondary | ICD-10-CM | POA: Diagnosis not present

## 2023-09-25 DIAGNOSIS — E119 Type 2 diabetes mellitus without complications: Secondary | ICD-10-CM | POA: Diagnosis not present

## 2023-09-25 DIAGNOSIS — I272 Pulmonary hypertension, unspecified: Secondary | ICD-10-CM | POA: Diagnosis not present

## 2023-10-02 NOTE — Progress Notes (Signed)
Remote pacemaker transmission.   

## 2023-10-03 ENCOUNTER — Encounter: Payer: Self-pay | Admitting: Neurology

## 2023-10-03 ENCOUNTER — Ambulatory Visit (INDEPENDENT_AMBULATORY_CARE_PROVIDER_SITE_OTHER): Payer: Medicare Other | Admitting: Neurology

## 2023-10-03 VITALS — BP 94/60 | HR 88 | Ht 64.0 in | Wt 181.4 lb

## 2023-10-03 DIAGNOSIS — F0153 Vascular dementia, unspecified severity, with mood disturbance: Secondary | ICD-10-CM | POA: Diagnosis not present

## 2023-10-03 DIAGNOSIS — R413 Other amnesia: Secondary | ICD-10-CM

## 2023-10-03 NOTE — Progress Notes (Addendum)
SLEEP MEDICINE CLINIC    Provider:  Melvyn Novas, MD  Primary Care Physician:  Merri Brunette, MD [address] [place] [address]     Referring Provider: Merri Brunette, Md [address] [place],  [place] [address]          Chief Complaint according to patient   Patient presents with:     New Patient (Initial Visit)           HISTORY OF PRESENT ILLNESS:  Jamie Ezekial Arns. is a 82 y.o. male patient who is seen upon referral on 10/03/2023 from Perlie Gold, NP  for a new evaluation .    I have the pleasure of seeing [name]. On 10/03/23 .he has chronic atrial fib and was referred for a sleep study in 01-28-2020 , by Margie Ege, NP for Dr Anne Hahn.   "[name] is a 82 year old male with history of sleep apnea on CPAP for many years- , TIA and memory disturbance.  He has complained of persistent daytime fatigue, easily falls asleep during the day.  He also has history of hallucinations.  He tried to move his Aricept dosing in the morning, but that did not impact his hallucinations.  After his last visit, he was taken off Aricept.   His residual AHI was 1.6/h on autotitration capable CPAP with a pressure range of 6-16 cm water and 3 cm EPR, ResMed F 30 FFM . Due to high Epworth score (EDS), the question of Narcolepsy was raised. He was tested by HLA for narcolepsy trait, this was negative, thus leaving a low yield for the diagnosis.  MSLT was ordered.  (The patient was supposingly instructed to wean off all tricyclica, SSRis, anticholinergic medications, any sedatives and stimulants in preparation and be off those medications for 3 weeks)".    The patient endorsed the Epworth Sleepiness Scale at 24/24 points.   He has a pacemaker.  He remains  in a fib.    03/08/22: [name] is a 82 year old male with a history of obstructive sleep apnea on CPAP.  He returns today for follow-up.  He still struggles with daytime sleepiness.  He reports  that he did try modafinil 50 mg daily.   Unsure of the benefit but reports insurance will no longer cover this.   In the past he was seeing Dr. Anne Hahn for "some memory issues" the were never differentiated.  His wife reports that she has noticed some issues with his memory.  He is able to complete all ADLs independently.  He no longer drives.  He is on Namenda. He is almost deaf   The patient is currently on an auto titration CPAP device set between 6 and 16 cm water pressure with 3 cm expiratory relief.  He is 87% compliant by days but does not manage to use the machine for 4 hours each night.  The residual AHI is only 1.1/h and no central apneas seem to be arising.  The 95th percentile pressure is 8.8 cm water he has very high air leakage.    Family medical history: sister has dementia and mother had severe dementia, onset at 57.   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 No longer driving. No pets.  Tobacco use:  quit after 30 years and used chewing tobacco .  ETOH use ;  none , Caffeine intake in form of Coffee( half caff - 1-2 cups ) Soda( /) Tea ( /) , no energy drinks     Sleep habits are as follows: The patient's dinner time is between 7-8 PM. The patient goes to bed at 12-2 AM and continues to sleep for 3 hours, wakes for  bathroom breaks, the first time at 4 AM.  After wards  he forgets to use CPAP again.  The preferred sleep position is supine , with the support of 2 pillows. Dreams are reportedly infrequent/vivid.   The patient wakes up spontaneously. No routine -  8-9  AM is the usual rise time. By 11 he may be back in bed to sleep again.  He reports not feeling refreshed or restored in AM, with symptoms such as dry mouth, no morning headaches, and residual fatigue.  Naps are taken frequently, lasting hours.  Sleeping 10- 12 hours a day.   Review of Systems: Out  of a complete 14 system review, the patient complains of only the following symptoms, and all other reviewed systems are negative.:  Fatigue, sleepiness , snoring,   How likely are you to doze in the following situations: 0 = not likely, 1 = slight chance, 2 = moderate chance, 3 = high chance   Sitting and Reading? Watching Television? Sitting inactive in a public place (theater or meeting)? As a passenger in a car for an hour without a break? Lying down in the afternoon when circumstances permit? Sitting and talking to someone? Sitting quietly after lunch without alcohol? In a car, while stopped for a few minutes in traffic?   Total = 20/ 24 points   FSS endorsed at 63/ 63 points.   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     He is a Emergency planning/management officer.     Social Determinants of Health   Financial Resource Strain: Not on file  Food Insecurity: Not on file  Transportation Needs: Not on file  Physical Activity: Not on file  Stress: Not on file  Social Connections: Not on file    Family History  Problem Relation Age of Onset   Heart disease Father    Heart attack  Father    Heart failure Father    Heart disease Mother    Alzheimer's disease Mother    Dementia Mother     Past Medical History:  Diagnosis Date   (HFpEF) heart failure with preserved ejection fraction (HCC)    a.  05/2013 Echo: EF 55%, mild LVH, diast dysfxn, Ao sclerosis, mildly dil LA, RV dysfxn (poorly visualized), PASP ;  b. 03/2017 Echo: EF 55-60%, no rwma, triv MR, mildly dil RV, mod TR, PASP .   Atrial fibrillation (HCC)    s/p Cox Maze 1/09;  Multaq Rx d/c'd in 2014 due to pulmo fibrosis;  coumadin d/c'd in 2014 due to spontaneous subdural hematoma   BPH (benign prostatic hyperplasia)    CAD (coronary artery disease), native coronary artery    a. s/p CABG 12/2007;  b. Myoview 12/2011: EF 66%, no scar or ischemia; normal.   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 Jude   Peripheral neuropathy 07/31/2018   Pulmonary fibrosis (HCC)    Multaq d/c'd 7/14   Subdural hematoma (HCC) 07/2012   spontaneous;  coumadin d/c'd => no longer a candidate for anticoagulation    Past Surgical History:  Procedure Laterality Date   AMPUTATION Left 05/17/2019   Procedure: AMPUTATION LEFT FOURTH TOE;  Surgeon: Cammy Copa, MD;  Location: [facility] OR;  Service: Orthopedics;  Laterality: Left;   AMPUTATION TOE Right 02/06/2020   Procedure: AMPUTATION TOE;  Surgeon: Nadara Mustard, MD;  Location: [name] OR;  Service: Orthopedics;  Laterality: Right;   AMPUTATION TOE Right 08/17/2020   Procedure: AMPUTATION TOE 4th toe;  Surgeon: [name], DPM;  Location: [facility] ORS;  Service: Podiatry;  Laterality: Right;   APPENDECTOMY     CHOLECYSTECTOMY     CORONARY ARTERY BYPASS GRAFT     x3 (left internal mammary artery to distal left anterior descending coronary artery, saphenous vain graft to second circumflex marginal branch, saphenous  vain graft to posterior descending coronary artery, endoscopic saphenous vain harvest from right thigh) and modified Cox - Maze IV procedure.  [name]. [name],MD. Electronically signed CHO/MEDQ D: 01/09/2008/ JOB: [id number] cc:  Antionette Char MD   CRANIOTOMY  07/30/2012   Procedure: CRANIOTOMY HEMATOMA EVACUATION SUBDURAL;  Surgeon: Mariam Dollar, MD;  Location: [facility] NEURO ORS;  Service: Neurosurgery;  Laterality: Right;  Right craniotomy for evacuation of subdural hematoma   FOOT SURGERY     HERNIA REPAIR     INTRAMEDULLARY (IM) NAIL INTERTROCHANTERIC Right 02/04/2020   Procedure: INTRAMEDULLARY (IM) NAIL INTERTROCHANTRIC;  Surgeon: Beverely Low, MD;  Location: [facility] OR;  Service: Orthopedics;  Laterality: Right;   ORCHIECTOMY     Left  /  testicular cancer   PACEMAKER PLACEMENT     PPM - St. Jude   PPM GENERATOR CHANGEOUT N/A 06/25/2019   Procedure: PPM GENERATOR CHANGEOUT;  Surgeon: Marinus Maw, MD;  Location: [facility] INVASIVE CV LAB;  Service: Cardiovascular;  Laterality: N/A;   RIGHT HEART CATH N/A 05/22/2023   Procedure: RIGHT HEART CATH;  Surgeon: Laurey Morale, MD;  Location: [facility] INVASIVE CV LAB;  Service: Cardiovascular;  Laterality: N/A;     Current Outpatient Medications on File Prior to Visit  Medication Sig Dispense Refill   allopurinol (ZYLOPRIM) 300 MG tablet Take 1 tablet (300 mg total) by mouth daily. 30 tablet 0   colchicine 0.6 MG tablet      dapagliflozin propanediol (FARXIGA) 10 MG TABS tablet Take 1 tablet (10 mg total) by mouth daily before breakfast. 30 tablet 11   digoxin (LANOXIN) 0.125 MG tablet TAKE ONE  TABLET BY MOUTH MONDAY THROUGH FRIDAY. DO NOT TAKE ON SATURDAY OR SUNDAY. 90 tablet 3   ezetimibe (ZETIA) 10 MG tablet Take 1 tablet (10 mg total) by mouth daily. 90 tablet 3   ferrous sulfate 325 (65 FE) MG tablet Take 1 tablet (325 mg total) by mouth daily with breakfast. 30 tablet 3   memantine (NAMENDA) 10 MG tablet Take 10 mg by mouth 2 (two) times daily.     metoprolol  succinate (TOPROL-XL) 25 MG 24 hr tablet Take 25 mg by mouth daily. Pt taking 1/2 tablet daily     mirabegron ER (MYRBETRIQ) 50 MG TB24 tablet Take 1 tablet (50 mg total) by mouth daily. 30 tablet 3   modafinil (PROVIGIL) 100 MG tablet TAKE 1 TABLET BY MOUTH EVERY DAY 30 tablet 5   Multiple Vitamin (MULTIVITAMIN WITH MINERALS) TABS tablet Take 1 tablet by mouth daily.     NON FORMULARY Pt uses a cpap nightly     pantoprazole (PROTONIX) 40 MG tablet Take 1 tablet (40 mg total) by mouth daily. 30 tablet 0   Pirfenidone 267 MG TABS Take 2 tablets (534 mg total) by mouth 3 (three) times daily with meals. **low dose as maintenance** 180 tablet 5   rosuvastatin (CRESTOR) 40 MG tablet Take 1 tablet (40 mg total) by mouth daily. 90 tablet 2   sertraline (ZOLOFT) 100 MG tablet Take 2 tablets (200 mg total) by mouth daily. (Patient taking differently: Take 100 mg by mouth daily. 1 tablets daily) 30 tablet 0   silver sulfADIAZINE (SILVADENE) 1 % cream APPLY 1 APPLICATION TOPICALLY DAILY (Patient taking differently: Apply 1 Application topically daily as needed.) 50 g 0   spironolactone (ALDACTONE) 25 MG tablet Take 0.5 tablets (12.5 mg total) by mouth daily. 45 tablet 3   tamsulosin (FLOMAX) 0.4 MG CAPS capsule Take 1 capsule (0.4 mg total) by mouth at bedtime. 30 capsule 1   Teriparatide, Recombinant, 620 MCG/2.48ML SOPN inject 20 mcg Subcutaneous Once a day 1 mL 11   torsemide (DEMADEX) 20 MG tablet Take 3 tablets (60 mg total) by mouth 2 (two) times daily. 180 tablet 6   No current facility-administered medications on file prior to visit.    Allergies  Allergen Reactions   Aricept [Donepezil] Other (See Comments)    Hallucination    Baclofen Itching   Lipitor [Atorvastatin] Other (See Comments)    Stiff joints   Nsaids Other (See Comments)    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   Warfarin And Related Other (See Comments)    Stiff joints   Enbrel [Etanercept] Itching     DIAGNOSTIC DATA (LABS,  IMAGING, TESTING) - I reviewed patient records, labs, notes, testing and imaging myself where available.  Lab Results  Component Value Date   WBC 3.2 (L) 05/17/2023   HGB 10.5 (L) 05/22/2023   HGB 10.9 (L) 05/22/2023   HCT 31.0 (L) 05/22/2023   HCT 32.0 (L) 05/22/2023   MCV 94.2 05/17/2023   PLT 74 (L) 05/17/2023      Component Value Date/Time   NA 138 08/09/2023 1453   NA 145 (H) 03/29/2023 1203   NA 141 02/23/2017 1237   K 4.0 08/09/2023 1453   K 3.8 02/23/2017 1237   CL 101 08/09/2023 1453   CO2 29 08/09/2023 1453   CO2 27 02/23/2017 1237   GLUCOSE 100 (H) 08/09/2023 1453   GLUCOSE 113 02/23/2017 1237   BUN 33 (H) 08/09/2023 1453   BUN 31 (H) 03/29/2023  1203   BUN 18.3 02/23/2017 1237   CREATININE 1.53 (H) 08/09/2023 1453   CREATININE 1.37 (H) 05/17/2023 0948   CREATININE 1.2 02/23/2017 1237   CALCIUM 8.8 (L) 08/09/2023 1453   CALCIUM 9.6 02/23/2017 1237   PROT 9.3 (H) 06/26/2023 1554   PROT 7.8 03/29/2023 1203   PROT 8.0 02/23/2017 1237   ALBUMIN 4.2 06/26/2023 1554   ALBUMIN 4.0 03/29/2023 1203   ALBUMIN 4.1 02/23/2017 1237   AST 48 (H) 06/26/2023 1554   AST 29 05/17/2023 0948   AST 24 02/23/2017 1237   ALT 26 06/26/2023 1554   ALT 14 05/17/2023 0948   ALT 15 02/23/2017 1237   ALKPHOS 85 06/26/2023 1554   ALKPHOS 105 02/23/2017 1237   BILITOT 0.9 06/26/2023 1554   BILITOT 0.9 05/17/2023 0948   BILITOT 0.98 02/23/2017 1237   GFRNONAA 46 (L) 08/09/2023 1453   GFRNONAA 52 (L) 05/17/2023 0948   GFRAA 72 02/01/2021 1410   Lab Results  Component Value Date   CHOL 97 (L) 03/29/2023   HDL 47 03/29/2023   LDLCALC 36 03/29/2023   LDLDIRECT 154.2 10/20/2013   TRIG 65 03/29/2023   CHOLHDL 2.1 03/29/2023   Lab Results  Component Value Date   HGBA1C 5.6 05/21/2019   Lab Results  Component Value Date   VITAMINB12 439 05/17/2023   Lab Results  Component Value Date   TSH 5.573 (H) 06/26/2023    PHYSICAL EXAM:  Today's Vitals   10/03/23 1329  BP:  94/60  Pulse: 88  Weight: 181 lb 6.4 oz (82.3 kg)  Height: 5\' 4"  (1.626 m)   Body mass index is 31.14 kg/m.   Wt Readings from Last 3 Encounters:  10/03/23 181 lb 6.4 oz (82.3 kg)  09/11/23 179 lb 9.6 oz (81.5 kg)  08/21/23 185 lb (83.9 kg)     Ht Readings from Last 3 Encounters:  10/03/23 5\' 4"  (1.626 m)  09/11/23 5' 4.5" (1.638 m)  08/21/23 5\' 8"  (1.727 m)      General: The patient is awake, alert and appears not in acute distress. The patient is well groomed. Head: Normocephalic, atraumatic. Skin:  Without evidence of ankle edema, or rash. Trunk: The patient's posture is erect.   NEUROLOGIC EXAM: The patient is awake and alert, oriented to place and time.   Memory subjective described as impaired :    10/03/2023    3:04 PM  Montreal Cognitive Assessment   Visuospatial/ Executive (0/5) 3  Naming (0/3) 3  Attention: Read list of digits (0/2) 2  Attention: Read list of letters (0/1) 0  Attention: Serial 7 subtraction starting at 100 (0/3) 3  Language: Repeat phrase (0/2) 2  Language : Fluency (0/1) 0  Abstraction (0/2) 1  Delayed Recall (0/5) 0  Orientation (0/6) 5  Total 19     mini-mental status exam    10/03/2023    1:38 PM 04/03/2023   11:45 AM 09/13/2022   11:02 AM 03/08/2022   11:49 AM 11/18/2019   10:04 AM 08/14/2019   10:07 AM 06/19/2019   10:58 AM  MMSE - Mini Mental State Exam  Not completed:     -- --   Orientation to time 5 3 4 5 4 2 5   Orientation to Place 5 5 5 5 4 4 3   Registration 3 3 3 3 3 3 3   Attention/ Calculation 5 2 1 5 4 5 5   Attention/Calculation-comments    WORLD   couldnt do numbers  Recall 2  2 2 1 3 2 2   Language- name 2 objects 2 2 2 2 2 2 2   Language- repeat 1 1 1 1 1 1 1   Language- follow 3 step command 3 3 3 3 3 3 3   Language- read & follow direction 1 1 1 1 1 1 1   Write a sentence 1 1 1 1 1 1 1   Copy design 1 0 1 1 1 1 1   Total score 29 23 24 28 27 25 27       Attention span & concentration ability appears restricted   Speech is non-fluent,  without  dysarthria,  with dysphonia , with  aphasia.  Mood and affect are appropriate.   Cranial nerves: no loss of smell or taste reported  Pupils are equal and briskly reactive to light. Funduscopic exam deferred. .  Extraocular movements in vertical and horizontal planes were intact and without nystagmus. No Diplopia. Visual fields by finger perimetry are intact. Hearing was severely impaired,  Facial sensation intact to fine touch.  Facial motor strength is symmetric and tongue and uvula move midline.  Neck ROM : rotation, tilt and flexion extension were normal for age and shoulder shrug was symmetrical.    Motor exam:  Symmetric bulk, tone and ROM.   Normal tone without cog -wheeling, good symmetric grip strength .   Sensory:  deferred.    Coordination: The Finger-to-nose maneuver was intact without evidence of ataxia, dysmetria or tremor.   Gait and station: Patient could rise unassisted from a seated position, walked with a cane for 3 years.   Toe and heel walk were deferred.  Deep tendon reflexes: in the upper and lower extremities are symmetric and intact.     ASSESSMENT AND PLAN 82 y.o. year old male  here with: OSA, Memory loss , history if SDH     1) poor compliance to CPAP, he forgets to put it on.  When he uses it , the AHI is great.   2) MOCA was 19/ 30 points- a MMSE will not capture executive function impairment . This patient is at high risk for vascular Dementia and for AD, due to strong family history of Alzheimer's disease, This is not MCI -  He lost time frame concept , he has a smaller vocabulary.  Poor spontaneous output of words, even socially norm- responses such as " excuse me'  or "thank you" have been sparse.   He is unable to recognizing danger or predict the outcome of actions. Glad he doesn't drive.    3) MRI cannot be ordered, ( or can it - pacemaker patient) , if not do  CT brain .  4) ATN  panel today.  Follow up either  personally or through our NP within 6 months.   I would like to thank  Merri Brunette, Md [address] [place],  [place] [address] for allowing me to meet with and to take care of this pleasant patient.     After spending a total time of  60  minutes face to face and additional time for physical and neurologic examination, review of laboratory studies,  personal review of imaging studies, reports and results of other testing and review of referral information / records as far as provided in visit,   Electronically signed by: Melvyn Novas, MD 10/03/2023 2:08 PM  [facility] Board certified by The [employer] of Sleep Medicine and Diplomate of the [employer] of Sleep Medicine. Board  certified In Neurology through the ABPN, Fellow of the [employer] of Neurology.

## 2023-10-04 ENCOUNTER — Telehealth: Payer: Self-pay | Admitting: Neurology

## 2023-10-04 DIAGNOSIS — I110 Hypertensive heart disease with heart failure: Secondary | ICD-10-CM | POA: Diagnosis not present

## 2023-10-04 DIAGNOSIS — I272 Pulmonary hypertension, unspecified: Secondary | ICD-10-CM | POA: Diagnosis not present

## 2023-10-04 DIAGNOSIS — E119 Type 2 diabetes mellitus without complications: Secondary | ICD-10-CM | POA: Diagnosis not present

## 2023-10-04 DIAGNOSIS — F0390 Unspecified dementia without behavioral disturbance: Secondary | ICD-10-CM | POA: Diagnosis not present

## 2023-10-04 DIAGNOSIS — I4819 Other persistent atrial fibrillation: Secondary | ICD-10-CM | POA: Diagnosis not present

## 2023-10-04 DIAGNOSIS — I5032 Chronic diastolic (congestive) heart failure: Secondary | ICD-10-CM | POA: Diagnosis not present

## 2023-10-04 NOTE — Telephone Encounter (Signed)
medicare/[employer]ARP NPR sent to [employer] [phone]

## 2023-10-06 LAB — ATN PROFILE
A -- Beta-amyloid 42/40 Ratio: 0.108 (ref >0.102)
Beta-amyloid 40: 273.46 pg/mL
Beta-amyloid 42: 29.46 pg/mL
N -- NfL, Plasma: 5.63 pg/mL (ref 0.00–11.55)
T -- p-tau181: 2.34 pg/mL — ABNORMAL HIGH (ref 0.00–0.97)

## 2023-10-08 ENCOUNTER — Ambulatory Visit (INDEPENDENT_AMBULATORY_CARE_PROVIDER_SITE_OTHER): Payer: Medicare Other | Admitting: Podiatry

## 2023-10-08 ENCOUNTER — Telehealth: Payer: Self-pay

## 2023-10-08 DIAGNOSIS — I739 Peripheral vascular disease, unspecified: Secondary | ICD-10-CM | POA: Diagnosis not present

## 2023-10-08 DIAGNOSIS — M79675 Pain in left toe(s): Secondary | ICD-10-CM | POA: Diagnosis not present

## 2023-10-08 DIAGNOSIS — L84 Corns and callosities: Secondary | ICD-10-CM | POA: Diagnosis not present

## 2023-10-08 DIAGNOSIS — N1831 Chronic kidney disease, stage 3a: Secondary | ICD-10-CM | POA: Diagnosis not present

## 2023-10-08 DIAGNOSIS — E1149 Type 2 diabetes mellitus with other diabetic neurological complication: Secondary | ICD-10-CM | POA: Diagnosis not present

## 2023-10-08 DIAGNOSIS — B351 Tinea unguium: Secondary | ICD-10-CM

## 2023-10-08 DIAGNOSIS — M79674 Pain in right toe(s): Secondary | ICD-10-CM | POA: Diagnosis not present

## 2023-10-08 NOTE — Telephone Encounter (Signed)
Reviewed results with wife and she verbalized understanding and verbalized understanding of MRI and pacemaker. She states she has a card with the brand and serial number and will take it with her to appt and give information to them when they call to schedule.

## 2023-10-08 NOTE — Telephone Encounter (Signed)
-----   Message from [place] Dohmeier sent at 10/06/2023 12:54 PM EDT ----- MRI brain is pending. P tau is one marker of Alzheimer's disease, but the more specific biomarker is the amyloid ration 42/40 - which was not elevated.  Not a positive test for AD. This patient has high levels of vascular risk factors.   MRI _ This patient will need special documentation that his pacemaker is compatible.  He needs to show his card with type of pacer and serial number - best to place a copy in his medical chart and give these data to MRI staff when scheduling , PLUS: take a copy to the MRI appointment

## 2023-10-10 ENCOUNTER — Telehealth: Payer: Self-pay | Admitting: Neurology

## 2023-10-10 NOTE — Progress Notes (Signed)
Subjective: No chief complaint on file.   82 year old male presents the office today for evaluation of preulcerative callus on the right foot submetatarsal 1 and for long toenails he is not able to trim himself may cause discomfort like elongated.  No open lesions otherwise.  No drainage or pus.  No other concerns.    Objective: AAO x3, NAD- presents with wife. DP/PT pulses palpable 1/4 b/l, CRT less than 3 seconds Sensation decreased with Semmes Weinstein monofilament. Thick callus with dried blood present right foot submetatarsal 1.  Upon debridement there is no underlying ulceration, drainage or any signs of infection but the area is preulcerative.  No signs of infection today. No other open lesions noted.  The nails appear to be hypertrophic, dystrophic to the left 1, 3, 5 in the right 1, 3, 5.  He has tenderness his nails are thickened elongated.  No edema, erythema or signs of infection. No pain with calf compression, swelling, warmth, erythema   Assessment: Preulcerative lesion right foot, symptomatic onychomycosis  Plan: -Sharp debrided the preulcerative callus x 1 on the right foot any complications or bleeding.  Area is preulcerative needs to monitor closely.  Offloading.  He has insoles at home that he has not been wearing recommend this. -Sharply debride the nails x 6 without any complications or bleeding -Daily foot inspection.  Return in about 2 months (around 12/09/2023) for pre-ulcerative callus, nail tirm .  [name] DPM

## 2023-10-10 NOTE — Telephone Encounter (Signed)
Radiology tech called and stated patient 's cardiac pacer leads are unsafe for MRI to be performed. She asked me to cancel the MRI.   There are often problems with MRI/ Radiology in patients with implanted devices , but this patient's pacemaker was supposed to be MRI  compatible.

## 2023-10-16 DIAGNOSIS — N200 Calculus of kidney: Secondary | ICD-10-CM | POA: Diagnosis not present

## 2023-10-16 DIAGNOSIS — R809 Proteinuria, unspecified: Secondary | ICD-10-CM | POA: Diagnosis not present

## 2023-10-16 DIAGNOSIS — I129 Hypertensive chronic kidney disease with stage 1 through stage 4 chronic kidney disease, or unspecified chronic kidney disease: Secondary | ICD-10-CM | POA: Diagnosis not present

## 2023-10-16 DIAGNOSIS — K7460 Unspecified cirrhosis of liver: Secondary | ICD-10-CM | POA: Diagnosis not present

## 2023-10-16 DIAGNOSIS — M1000 Idiopathic gout, unspecified site: Secondary | ICD-10-CM | POA: Diagnosis not present

## 2023-10-16 DIAGNOSIS — D631 Anemia in chronic kidney disease: Secondary | ICD-10-CM | POA: Diagnosis not present

## 2023-10-16 DIAGNOSIS — E1122 Type 2 diabetes mellitus with diabetic chronic kidney disease: Secondary | ICD-10-CM | POA: Diagnosis not present

## 2023-10-16 DIAGNOSIS — N1831 Chronic kidney disease, stage 3a: Secondary | ICD-10-CM | POA: Diagnosis not present

## 2023-10-20 ENCOUNTER — Other Ambulatory Visit (HOSPITAL_COMMUNITY): Payer: Self-pay | Admitting: Family Medicine

## 2023-10-22 ENCOUNTER — Encounter: Payer: Self-pay | Admitting: Neurology

## 2023-10-22 NOTE — Patient Instructions (Incomplete)
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  Worse 2023 0> April 2024 -> stable through aug 2024  Plan - start pirfenidone low dose protocol - clinical trials as care option - discussed but this is for later - need LFT in 1 month   Pedal edema Severe tricuspid regurgitation Right ventricular dysfunction  -  RHC shows pulmonary venous congenstion related incrased pressures - glad you are better  Plan   = per cardiology    Hx of OSA  -Not sure why your oxygen overnight desaturation test was not done in April 2024 Plan  - continue CPAP -CMA to order  07/10/2023  ONO on room air while using CPAP   Follow-up - Return in the next 8 weeks for 30-minute visit with Dr. Marchelle Gearing or APP to regroup and discuss

## 2023-10-22 NOTE — Progress Notes (Unsigned)
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  He had pulmonary function tests in 2020.  He had an autoimmune workup in 2014 that was negative.  He had a TLC of 63% in 2014 and a reduced DLCO.  He had progression from 20 14-20 20.  He has not been on any antifibrotic's now presents with shortness of breath with exertion and cough.    OV 03/20/2023  Subjective:  Patient ID: [name]., male , DOB: Oct 08, 1941 , age 81 y.o. , MRN: [id number] , ADDRESS: [address] [place] [address] PCP Merri Brunette, MD Patient Care Team: Merri Brunette, MD as PCP - General Jens Som Madolyn Frieze, MD as PCP - Cardiology (Cardiology) Jens Som Madolyn Frieze, MD as Consulting Physician (Cardiology) Irena Reichmann, DO as Referring Physician (Family Medicine)  This Provider for this visit: Treatment Team:  Attending Provider: Kalman Shan, MD    03/20/2023 -   Chief Complaint  Patient presents with   Consult    Pulm. fibrosis.     HPI Matan Gailen Venne. 82 y.o. -   HRCT 3 OV 04/17/2023  Subjective:  Patient ID: [name]., male , DOB: February 26, 1941 , age 35 y.o. 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 82 y.o. -presents for ILD follow-up.  He brought his ILD questionnaire with him.  I made note of this for his 03/20/23 visit above.  Based on his answers, age greater than 33, progression of the CT scan and today on the pulmonary function test as well in the last 4 years setting the diagnosis IPF.  I gave this diagnosis to him and his wife.  His wife is the main historian here and she is an independent historian.  He has mild dementia.  But he does listen and he seems to have capacity to understand what is going on.  Did indicate to the wife serologies negative.  After the last visit I recommended right heart catheterization.  Dr. Ludwig Clarks office did try to reach him but could not get hold of him.  Subsequent to that he saw the PA and I am unclear as to what the right heart cath plans are.  It appears that more optimization of the left heart disease was done because his BNP was very high.  I also recommended a overnight pulse oximetry on room air while he uses his CPAP [he uses CPAP on room air] but this has not been done.  I have indicated to the wife we will reorder this.   Lab results do show he also has cirrhosis.  I had a detailed discussion with him and his wife about antifibrotic's.  Definitely do not recommend nintedanib is the  first choice.  He is intolerant to anticoagulants and he also has cardiac disease.  Did bring up the option of pirfenidone but overall in balance he has a lot of fatigue and a lot of side effects and his pulm polypharmacy.  We took a shared decision making that we would pursue right heart catheterization first and try to optimize his heart failure/pulmonary hypertension.  Did discuss inhaled treprostinil as an option to treat his WHO group 3 pulmonary hypertension if it exists.  Therefore we took a shared decision making to proceed with right heart cath.  I have informed his cardiologist again.    HRCT 03/06/23   Narrative & Impression  CLINICAL DATA:   Interstitial lung disease.   EXAM: CT CHEST WITHOUT CONTRAST   TECHNIQUE: Multidetector CT imaging of the chest was performed following the standard protocol without intravenous contrast. High resolution imaging of the lungs, as well as inspiratory and expiratory imaging, was performed.   RADIATION DOSE REDUCTION: This exam was performed according to the departmental dose-optimization program which includes automated exposure control, adjustment of the mA and/or kV according to patient size and/or use of iterative reconstruction technique.   COMPARISON:  08/03/2021.   FINDINGS: Cardiovascular: Atherosclerotic calcification of the aorta and aortic valve. Enlarged pulmonic trunk and heart. No pericardial effusion.   Mediastinum/Nodes: Mediastinal lymph nodes measure up to 10 mm in the high right paratracheal station, as before. Hilar regions are difficult to evaluate without IV contrast. No axillary adenopathy. Esophagus is grossly unremarkable.   Lungs/Pleura: Interval progression in peripheral and basilar predominant subpleural reticulation, ground-glass and traction bronchiectasis/bronchiolectasis. Interstitial thickening/coarsening in the lung bases. No definitive honeycombing. Calcified granulomas. Scattered fissural thickening bilaterally. No pleural fluid. No air trapping.   Upper Abdomen: Liver margin is irregular. Trace perihepatic ascites. Cholecystectomy. Visualized portions of the adrenal glands and right kidney are unremarkable. Hypodense and hyperdense lesions in the left kidney. No specific follow-up necessary. Spleen may be enlarged but is incompletely imaged. Visualized portions of the pancreas, stomach and bowel are grossly unremarkable. Ventral hernia repair. No upper abdominal adenopathy.   Musculoskeletal: Degenerative changes in the spine. No worrisome lytic or sclerotic lesions.   IMPRESSION: 1. Interval progression in fibrotic interstitial lung  disease, detailed above. Patient reportedly has a known diagnosis of idiopathic pulmonary fibrosis, as indicated in the prior CT chest report. 2. Cirrhosis. 3. Trace perihepatic ascites. 4.  Aortic atherosclerosis (ICD10-I70.0). 5. Enlarged pulmonic trunk, indicative of pulmonary arterial hypertension.     Electronically Signed   By: Leanna Battles M.D.   On: 03/07/2023 16:17      ECHO 03/13/23   IMPRESSIONS     1. Left ventricular ejection fraction, by estimation, is 55 to 60%. The  left ventricle has normal function. The left ventricle has no regional  wall motion abnormalities. Left ventricular diastolic parameters are  indeterminate. There is the  interventricular septum is flattened in diastole ('D' shaped left  ventricle), consistent with right ventricular volume overload.   2. Right ventricular systolic function is moderately reduced. The right  ventricular size is moderately enlarged. PASP likely underestimated due to  severity of TR.   3. Left atrial size was mildly dilated.   4. Right atrial size was moderately dilated.   5. The mitral valve is grossly normal. Trivial mitral valve  regurgitation.   6. Tricuspid valve regurgitation is severe.   7. The aortic valve is tricuspid. Aortic valve regurgitation is not  visualized. Aortic valve sclerosis/calcification is present, without any  evidence of aortic stenosis.   8. Aortic dilatation noted. There is borderline dilatation of the  ascending aorta, measuring 38 mm.   9. The inferior vena cava is dilated in size with <50% respiratory  variability, suggesting right atrial pressure of 15 mmHg.   Comparison(s): No significant change from prior TTE. There continues to be  severe TR with moderate RV dysfunction and enlargement. PASP likely  underestimated in the setting of severe TR.    OV 07/10/2023  Subjective:  Patient ID: [name]., male , DOB: 1941-08-19 , age 62 y.o. , MRN: [id number] , ADDRESS: [address] [place] [address] PCP Merri Brunette, MD Patient Care Team: Merri Brunette, MD as PCP - General Jens Som Madolyn Frieze, MD as PCP - Cardiology (Cardiology) Jens Som Madolyn Frieze, MD as Consulting Physician (Cardiology) Irena Reichmann, DO as Referring Physician (Family Medicine)  This Provider for this visit: Treatment Team:  Attending Provider: Kalman Shan, MD    07/10/2023 -   Chief Complaint  Patient presents with   Follow-up    !2 week f/up no complaints.    HPI Iam [employer]om. 82 y.o. -returns for follow-up.  Presents with his wife.  Wife is independent historian.  He is mostly quiet.  He has a cane.  But he is understanding the conversation.  He had his right heart catheterization.  Results are below.  I personally reviewed it.  He has pulmonary venous hypertension.  I reviewed Medical records indicate they have started increasing his torsemide.  No difference in shortness of breath but his pedal edema is better.  I had pulmonary function test today and I given that is a little stable compared to April 2024 but overall progressive.  Again mentioned IPF and progressive nature of this disease.  Other than the above in the interim Interim Health status: No new complaints No new medical problems. No new surgeries. No ER visits. No Urgent care visits. No changes to medications   We discussed antifibrotic therapy.  Also discussed clinical trials [see below].  At this point in time first-line is to do antifibrotic therapy.  Previously recommended against nintedanib because of cardiac issues and anticoagulation.  Discussed pirfenidone.  Discussed that we will do the low-dose protocol.  I have sent a message to our pharmacy team about this. Pirfenidone is approved drug in the [place] since Oct 2014 but approved prior  in [place], [place], [place], and [place] for treatment of IPF.  Is a high risk prescription that requires frequent therapeutic monitoring.  RHC June 2024  1.  Severe  elevation in right heart filling pressures with prominent V-waves in RA pressure tracing suggesting significant TR.  2.  Mild pulmonary hypertension, suspect predominantly pulmonary venous hypertension.  3.  Mildly elevated PCWP.  4.  Mildly decreased cardiac output.  5.  RV dysfunction as evidenced by low PAPi.    Increase torsemide to 40 mg bid and add KCl 20 daily.  Will arrange CHF clinic followup   OV 10/23/2023  Subjective:  Patient ID: [name]., male , DOB: [birth year] , age 89 y.o. , MRN: [id number] , ADDRESS: [address] [place] [address] PCP Merri Brunette, MD Patient Care Team: Merri Brunette, MD as PCP - General Jens Som Madolyn Frieze, MD as PCP - Cardiology (Cardiology) Jens Som Madolyn Frieze, MD as Consulting Physician (Cardiology) Irena Reichmann, DO as Referring Physician (Family Medicine)  This Provider for this visit: Treatment Team:  Attending Provider: Kalman Shan, MD  #IPF  given diagnosis in April 2024 [progressive phenotype between 2020 and 2024] #Multiple other comorbidities including CKD with a GFR 50 18 June 2023/creatinine 1.4 #Right heart catheterization pulmonary venous hypertension June 2024 #Esbriet/Pirfenidone requires intensive drug monitoring due to high concerns for Adverse effects of , including  Drug Induced Liver Injury, significant GI side effects that include but not limited to Diarrhea, Nausea, Vomiting,  and other system side effects that include Fatigue, headaches, weight loss and other side effects such as skin rash. These will be monitored with  blood work such as LFT initially once a month for 6 months and then quarterly  - low dose protocol end July 2024/early aug 2024  #Medical problems include  - Memory issues -Sleep apnea on CPAP -CKD -Anemia  10/23/2023 -   Chief Complaint  Patient presents with   Follow-up    Pt has not yet completed ono, has been using cpap. Pt denies any concerns      HPI Jamie [employer]. 82  y.o. -returns for follow-up.  Presents with his wife.  He started pirfenidone low-dose protocol late July 2024/early August 2024.  He is tolerating it well without any side effects. Interim Health status: No new complaintss. No new surgeries. No ER visits. No Urgent care visits. No changes to medications according to the wife.  His last liver function test was in June 2024.  Wife believes he had some blood work 2 weeks ago but I could not locate it.  Is a blood work from a month ago with elevated creatinine.  But they have agreed to have repeat blood work done.  He is known to have anemia.  From a respiratory standpoint he is stable see below.  Excess hypoxemia test also stable.  Pirfenidone tolerance is fine.  They will have liver function test today.  Offered high-dose flu shot and he agreed.  Last high-resolution CT chest March 2024.  Key pertinent past medical history - Saw Dr. Brock Ra 10/16/2023 for cirrhosis.  I did indicate to him because he is on pirfenidone and to the wife that liver function test would need to be monitored especially in setting of cirrhosis they do not think the cirrhosis significant.  He did have right upper quadrant ultrasound earlier this year that the visualized and it did report cirrhosis we will check liver function test  -Saw Dr. Porfirio Mylar Dohmeier 10/03/2023 for memory disturbance.  Mini-Mental score was 29/30 but looks like he failed a [place] test.  They try to get an MRI done.  Based on chart review in the history of the wife.   SYMPTOM SCALE - ILD 04/17/2023 07/10/2023 Wife filled out 10/23/2023 Wife filled out  Current weight     O2 use 0    Shortness of Breath 0 -> 5 scale with 5 being worst (score 6 If unable to do)    At rest 0 1 1  Simple tasks - showers, clothes change, eating, shaving 3 2 1   Household (dishes, doing bed, laundry) 4 2 3   Walking level at own pace 3 2 4   Walking up Stairs 5 5 0  Total (30-36) Dyspnea Score 15 13 9       Non-dyspnea  symptoms (0-> 5 scale) 04/17/2023 07/10/2023  10/23/2023   How bad is your cough? 2 3 2   How bad is your fatigue 4 4 3   How bad is nausea 1 0 0  How bad is vomiting?  1 0 0  How bad is diarrhea? 2 0  0  How bad is anxiety? 1 1 2   How bad is depression 1 1 x  Any chronic pain - if so where and how bad 3  x      Simple office walk 224 (66+46 x 2) feet Pod A at [facility] x  3 laps goal with forehead probe 10/23/2023    O2 used ra   Number laps completed Sit stad x 10   Comments about pace Used cane   Resting Pulse Ox/HR 98% and 74/min   Final Pulse Ox/HR 93% and 101/min   Desaturated </= 88% no   Desaturated <= 3% points yes   Got Tachycardic >/= 90/min yes   Symptoms at end of test x   Miscellaneous comments x         PFT    Latest Ref Rng & Units 07/10/2023   12:41 PM 04/04/2023    3:55 PM 10/28/2019   11:12 AM  PFT Results  FVC-Pre L  2.44  3.05   FVC-Predicted Pre % 75  69  82   FVC-Post L  2.42  2.96   FVC-Predicted Post %  68  80   Pre FEV1/FVC % % 85  86  85   Post FEV1/FCV % %  89  88   FEV1-Pre L 2.27  2.10  2.59   FEV1-Predicted Pre % 90  84  97   FEV1-Post L  2.15  2.60   DLCO uncorrected ml/min/mmHg 7.27  8.67  15.51   DLCO UNC% % 32  39  68   DLCO corrected ml/min/mmHg 7.86  9.68  19.26   DLCO COR %Predicted % 35  43  84   DLVA Predicted % 75  70  107   TLC L  4.14  5.27   TLC % Predicted %  64  81   RV % Predicted %  70  101         LAB RESULTS last 96 hours No results found.  LAB RESULTS last 90 days Recent Results (from the past 2160 hour(s))  Basic Metabolic Panel (BMET)     Status: Abnormal   Collection Time: 07/30/23  9:02 AM  Result Value Ref Range   Sodium 137 135 - 145 mmol/L   Potassium 3.8 3.5 - 5.1 mmol/L   Chloride 98 98 - 111 mmol/L   CO2 27 22 - 32 mmol/L   Glucose, Bld 100 (H) 70 - 99 mg/dL    Comment: Glucose reference range applies only to samples taken after fasting for at least 8 hours.   BUN 34 (H) 8 - 23 mg/dL    Creatinine, Ser 6.21 (H) 0.61 - 1.24 mg/dL   Calcium 9.3 8.9 - 30.8 mg/dL   GFR, Estimated 42 (L) >60 mL/min    Comment: (NOTE) Calculated using the CKD-EPI Creatinine Equation (2021)    Anion gap 12 5 - 15    Comment: Performed at [facility], [address], [place], [place] [address]  Basic Metabolic Panel (BMET)     Status: Abnormal   Collection Time: 08/09/23  2:53 PM  Result Value Ref Range   Sodium 138 135 - 145 mmol/L   Potassium 4.0 3.5 - 5.1 mmol/L   Chloride 101 98 - 111 mmol/L   CO2 29 22 - 32 mmol/L   Glucose, Bld 100 (H) 70 - 99 mg/dL    Comment: Glucose reference range applies only to samples taken after fasting for at least 8 hours.  BUN 33 (H) 8 - 23 mg/dL   Creatinine, Ser 8.29 (H) 0.61 - 1.24 mg/dL   Calcium 8.8 (L) 8.9 - 10.3 mg/dL   GFR, Estimated 46 (L) >60 mL/min    Comment: (NOTE) Calculated using the CKD-EPI Creatinine Equation (2021)    Anion gap 8 5 - 15    Comment: Performed at [facility], [address], [place], [place] [address]  B Nat Peptide     Status: Abnormal   Collection Time: 08/09/23  2:53 PM  Result Value Ref Range   B Natriuretic Peptide 137.0 (H) 0.0 - 100.0 pg/mL    Comment: Performed at [facility], [address] [place], [place] [address]  Digoxin level     Status: None   Collection Time: 08/21/23  3:04 PM  Result Value Ref Range   Digoxin, Serum 0.7 0.5 - 0.9 ng/mL    Comment: Concentrations above 2.0 ng/mL are generally considered toxic. Some overlap of toxic and non-toxic values have been reported.                             Detection Limit = 0.4 ng/mL Therapeutic range is derived from 2013 ACCF/AHA Guidelines for the Management of Heart Failure.   CUP PACEART INCLINIC DEVICE CHECK     Status: None   Collection Time: 08/21/23  4:24 PM  Result Value Ref Range   Date Time Interrogation Session [id number]    Pulse Generator Manufacturer [employer]    Pulse Gen Model 2272 Assurity MRI    Pulse Gen  Serial Number [id number]    Clinic Name [facility]    Implantable Pulse Generator Type Implantable Pulse Generator    Implantable Pulse Generator Implant Date [id number]    Implantable Lead Manufacturer [facility]    Implantable Lead Model 1642T IsoFlex S    Implantable Lead Serial Number [id number]    Implantable Lead Implant Date [id number]    Implantable Lead Location Detail 1 APPENDAGE    Implantable Lead Location [id number]    Implantable Lead Connection Status [id number]    Implantable Lead Manufacturer [facility]    Implantable Lead Model 1646T IsoFlex S    Implantable Lead Serial Number [id number]    Implantable Lead Implant Date 63875643    Implantable Lead Location Detail 1 APEX    Implantable Lead Location [id number]    Implantable Lead Connection Status [id number]    Lead Channel Setting Sensing Sensitivity 2.0 mV   Lead Channel Setting Pacing Amplitude 2.0 V   Lead Channel Setting Pacing Pulse Width 0.4 ms   Lead Channel Setting Pacing Amplitude 2.5 V   Lead Channel Impedance Value 462.5 ohm   Lead Channel Sensing Intrinsic Amplitude 1.0 mV   Lead Channel Impedance Value 475.0 ohm   Lead Channel Sensing Intrinsic Amplitude 7.0 mV   Lead Channel Pacing Threshold Amplitude 0.75 V   Lead Channel Pacing Threshold Pulse Width 0.4 ms   Lead Channel Pacing Threshold Amplitude 0.75 V   Lead Channel Pacing Threshold Pulse Width 0.4 ms   Battery Status Unknown    Battery Remaining Longevity 80 mo   Battery Voltage 3.01 V   Brady Statistic RA Percent Paced 3.9 %   Brady Statistic RV Percent Paced 5.4 %   Eval Rhythm Afib/VS 88   Lab report - scanned     Status: None   Collection Time: 09/07/23  8:49 AM  Result Value Ref Range   EGFR  39.0     Comment: Abstracted by HIM   A1c 5.5     Comment: Abstracted by HIM   Creatinine, POC 33.2 mg/dL    Comment: Abstracted by HIM   Albumin, Urine POC 3.0     Comment: Abstracted by HIM   Microalb Creat Ratio 9.0     Comment: Abstracted by HIM  CUP PACEART  REMOTE DEVICE CHECK     Status: None   Collection Time: 09/18/23  5:09 AM  Result Value Ref Range   Date Time Interrogation Session [id number]    Pulse Generator Manufacturer [employer]    Pulse Gen Model 2272 Assurity MRI    Pulse Gen Serial Number [id number]    Clinic Name [facility]    Implantable Pulse Generator Type Implantable Pulse Generator    Implantable Pulse Generator Implant Date 57846962    Implantable Lead Manufacturer [facility]    Implantable Lead Model 1642T IsoFlex S    Implantable Lead Serial Number [id number]    Implantable Lead Implant Date [id number]    Implantable Lead Location Detail 1 APPENDAGE    Implantable Lead Location [id number]    Implantable Lead Connection Status [id number]    Implantable Lead Manufacturer [facility]    Implantable Lead Model 1646T IsoFlex S    Implantable Lead Serial Number [id number]    Implantable Lead Implant Date 53664403    Implantable Lead Location Detail 1 APEX    Implantable Lead Location [id number]    Implantable Lead Connection Status [id number]    Lead Channel Setting Sensing Sensitivity 2.0 mV   Lead Channel Setting Sensing Adaptation Mode Fixed Pacing    Lead Channel Setting Pacing Amplitude 2.0 V   Lead Channel Setting Pacing Pulse Width 0.4 ms   Lead Channel Setting Pacing Amplitude 2.5 V   Lead Channel Status NULL    Lead Channel Impedance Value 440 ohm   Lead Channel Sensing Intrinsic Amplitude 1.4 mV   Lead Channel Pacing Threshold Amplitude 1.0 V   Lead Channel Pacing Threshold Pulse Width 0.4 ms   Lead Channel Status NULL    Lead Channel Impedance Value 450 ohm   Lead Channel Sensing Intrinsic Amplitude 6.0 mV   Lead Channel Pacing Threshold Amplitude 0.75 V   Lead Channel Pacing Threshold Pulse Width 0.4 ms   Battery Status MOS    Battery Remaining Longevity 71 mo   Battery Remaining Percentage 62.0 %   Battery Voltage 3.01 V   Brady Statistic RA Percent Paced 4.4 %   Brady Statistic RV Percent Paced 5.9 %   Brady Statistic AP VP  Percent 12.0 %   Brady Statistic AS VP Percent 1.8 %   Brady Statistic AP VS Percent 4.8 %   Brady Statistic AS VS Percent 54.0 %  ATN PROFILE     Status: Abnormal   Collection Time: 10/03/23  3:06 PM  Result Value Ref Range   A -- Beta-amyloid 42/40 Ratio 0.108 >0.102   Beta-amyloid 42 29.46 pg/mL   Beta-amyloid 40 273.46 pg/mL   T -- p-tau181 2.34 (H) 0.00 - 0.97 pg/mL   N -- NfL, Plasma 5.63 0.00 - 11.55 pg/mL   ATN SUMMARY Comment     Comment:                        A- T+ N- A high pTau181 concentration was observed. A normal beta-amyloid 42/40 ratio and normal concentration of NfL were observed at this time. These results are not consistent  with the presence of Alzheimer's- related pathology, but may indicate pathology of another condition. Additional assessments may be necessary. These tests are intended to be used only in the context of clinical care.    Information: Comment     Comment: Beta-amyloid 42 and Beta-amyloid 40: Plasma beta-amyloid 1-42/1-40 ratios less than or equal to 0.102 suggest a higher probability of a patient being clinically diagnosed with Alzheimer's Disease (AD), while values above 0.102 suggest a lower probability of AD diagnosis. Precise plasma testing of Beta-amyloid 42 and Beta-amyloid 40 has demonstrated comparable effectiveness to traditional cerebrospinal fluid testing and amyloid positron emission tomography (PET) scans. When assessing the risk of AD pathology as the underlying cause for mild cognitive impairment (MCI) or dementia, it is important to consider various factors such as medical and family history, nutritional deficiency biomarkers, neuroimaging, and physical, neurological, and neuropsychological examinations. These tests were developed and their performance characteristics determined by [facility]. They have not been cleared or approved by the Food and Drug Administration.                                                                     * METHODOLOGY: Beta-amyloid 42/40 Ratio: Sysmex Chemiluminescence Enzyme Immunoassay (CLEIA) NfL and p-tau181: Tests performed by [employer] Electrochemiluminescence Immunoassay (ECLIA). Values obtained with different methods cannot be used interchangeable. These tests were developed and their performance characteristics determined by [facility]. They have not been cleared or approved by the Food and Drug Administration.                                                                    * p-tau181 INFORMATION: For individual 32-96 years of age:  0.00-0.95 pg/mL Reference interval is based on a population of ostensibly healthy individuals aged 80 to 63 years For individual greater than 9 years of age:  0.00-0.97 pg/mL Results greater than the clinical cut-off of 0.97 pg/mL in patients greater than 38 years of age are correlated with Abeta amyloid pathology as determined by amyloid PET imaging.                                                                     * 1. Interpretation comments are based on a consensus between [facility] for Age and the Internation Working Group recommendations for ATN panel interpretation published by Samantha Crimes al 2018 and updated in Agra et al 2021.                                                                    *  [name], Blennow K, Mamie Laurel, Jack CR Jr, Vergallo A. Developing the ATX(N) classification for use across the Alzheimer disease continiuum. Nat Rev Neurol. 2021 Sep;17(9):580-589.  Ree Kida CR Mervyn Skeeters DA, Blennow K, et al. A/T/N: An unbiased descriptive classification scheme for Alzheimer disease biomarkers. Neurology. 2016 Aug 2;87(5):539-547.          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   reports that he quit smoking about 32 years ago. His smoking use included cigarettes. He started smoking about 52 years ago. He has a 40 pack-year smoking history. He has never used smokeless tobacco.  Past Surgical History:  Procedure Laterality Date   AMPUTATION Left 05/17/2019   Procedure: AMPUTATION LEFT FOURTH TOE;  Surgeon: Cammy Copa, MD;  Location: [facility] OR;  Service: Orthopedics;  Laterality: Left;   AMPUTATION TOE Right 02/06/2020   Procedure: AMPUTATION TOE;  Surgeon: Nadara Mustard, MD;  Location: [facility] OR;  Service: Orthopedics;  Laterality: Right;   AMPUTATION TOE Right 08/17/2020   Procedure: AMPUTATION TOE 4th toe;  Surgeon: [name], DPM;  Location: [facility] ORS;  Service: Podiatry;  Laterality: Right;   APPENDECTOMY     CHOLECYSTECTOMY     CORONARY ARTERY BYPASS GRAFT     x3 (left internal mammary artery to distal left anterior descending coronary artery, saphenous vain graft to second circumflex marginal branch, saphenous vain graft to posterior descending coronary artery, endoscopic saphenous vain harvest from right thigh) and modified Cox - Maze IV procedure.  [name]. [name],MD. Electronically signed CHO/MEDQ D: 01/09/2008/ JOB: [id number] cc:  Antionette Char MD   CRANIOTOMY  07/30/2012   Procedure: CRANIOTOMY HEMATOMA EVACUATION SUBDURAL;  Surgeon: Mariam Dollar, MD;  Location: [facility] NEURO ORS;  Service: Neurosurgery;  Laterality: Right;  Right craniotomy for evacuation of subdural hematoma   FOOT SURGERY     HERNIA REPAIR     INTRAMEDULLARY (IM) NAIL INTERTROCHANTERIC Right 02/04/2020   Procedure: INTRAMEDULLARY (IM) NAIL INTERTROCHANTRIC;  Surgeon: Beverely Low, MD;  Location: [facility] OR;  Service: Orthopedics;  Laterality: Right;   ORCHIECTOMY      Left  /  testicular cancer   PACEMAKER PLACEMENT     PPM - St. Jude   PPM GENERATOR CHANGEOUT N/A 06/25/2019   Procedure: PPM GENERATOR CHANGEOUT;  Surgeon: Marinus Maw, MD;  Location: [facility] INVASIVE CV LAB;  Service: Cardiovascular;  Laterality: N/A;   RIGHT HEART CATH N/A 05/22/2023   Procedure: RIGHT HEART CATH;  Surgeon: Laurey Morale, MD;  Location: [facility] INVASIVE CV LAB;  Service: Cardiovascular;  Laterality: N/A;    Allergies  Allergen Reactions   Aricept [Donepezil] Other (See Comments)    Hallucination    Baclofen Itching   Lipitor [Atorvastatin] Other (See Comments)    Stiff joints   Nsaids Other (See Comments)    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   Warfarin And Related Other (See Comments)    Stiff joints   Enbrel [Etanercept] Itching    Immunization History  Administered Date(s) Administered   Fluad Quad(high Dose 65+) 08/18/2019, 11/08/2021   Influenza, High Dose Seasonal PF 09/17/2018   PFIZER(Purple Top)SARS-COV-2 Vaccination 06/21/2020, 07/12/2020   Tdap 08/14/2014,  07/06/2019   Zoster Recombinant(Shingrix) 06/30/2019    Family History  Problem Relation Age of Onset   Heart disease Father    Heart attack Father    Heart failure Father    Heart disease Mother    Alzheimer's disease Mother    Dementia Mother      Current Outpatient Medications:    allopurinol (ZYLOPRIM) 300 MG tablet, Take 1 tablet (300 mg total) by mouth daily., Disp: 30 tablet, Rfl: 0   colchicine 0.6 MG tablet, , Disp: , Rfl:    dapagliflozin propanediol (FARXIGA) 10 MG TABS tablet, Take 1 tablet (10 mg total) by mouth daily before breakfast., Disp: 30 tablet, Rfl: 11   digoxin (LANOXIN) 0.125 MG tablet, TAKE ONE TABLET BY MOUTH MONDAY THROUGH FRIDAY. DO NOT TAKE ON SATURDAY OR SUNDAY., Disp: 90 tablet, Rfl: 3   ezetimibe (ZETIA) 10 MG tablet, Take 1 tablet (10 mg total) by mouth daily., Disp: 90 tablet, Rfl: 3   ferrous sulfate 325 (65 FE) MG tablet, Take 1 tablet (325 mg total) by mouth  daily with breakfast., Disp: 30 tablet, Rfl: 3   memantine (NAMENDA) 10 MG tablet, Take 10 mg by mouth 2 (two) times daily., Disp: , Rfl:    metoprolol succinate (TOPROL-XL) 25 MG 24 hr tablet, Take 25 mg by mouth daily. Pt taking 1/2 tablet daily, Disp: , Rfl:    mirabegron ER (MYRBETRIQ) 50 MG TB24 tablet, Take 1 tablet (50 mg total) by mouth daily., Disp: 30 tablet, Rfl: 3   modafinil (PROVIGIL) 100 MG tablet, TAKE 1 TABLET BY MOUTH EVERY DAY, Disp: 30 tablet, Rfl: 5   Multiple Vitamin (MULTIVITAMIN WITH MINERALS) TABS tablet, Take 1 tablet by mouth daily., Disp:  , Rfl:    NON FORMULARY, Pt uses a cpap nightly, Disp: , Rfl:    pantoprazole (PROTONIX) 40 MG tablet, Take 1 tablet (40 mg total) by mouth daily., Disp: 30 tablet, Rfl: 0   Pirfenidone 267 MG TABS, Take 2 tablets (534 mg total) by mouth 3 (three) times daily with meals. **low dose as maintenance**, Disp: 180 tablet, Rfl: 5   rosuvastatin (CRESTOR) 40 MG tablet, Take 1 tablet (40 mg total) by mouth daily., Disp: 90 tablet, Rfl: 2   sertraline (ZOLOFT) 100 MG tablet, Take 2 tablets (200 mg total) by mouth daily. (Patient taking differently: Take 100 mg by mouth daily. 1 tablets daily), Disp: 30 tablet, Rfl: 0   silver sulfADIAZINE (SILVADENE) 1 % cream, APPLY 1 APPLICATION TOPICALLY DAILY (Patient taking differently: Apply 1 Application topically daily as needed.), Disp: 50 g, Rfl: 0   spironolactone (ALDACTONE) 25 MG tablet, Take 0.5 tablets (12.5 mg total) by mouth daily., Disp: 45 tablet, Rfl: 3   tamsulosin (FLOMAX) 0.4 MG CAPS capsule, Take 1 capsule (0.4 mg total) by mouth at bedtime., Disp: 30 capsule, Rfl: 1   Teriparatide, Recombinant, 620 MCG/2.48ML SOPN, inject 20 mcg Subcutaneous Once a day, Disp: 1 mL, Rfl: 11   torsemide (DEMADEX) 20 MG tablet, Take 3 tablets (60 mg total) by mouth 2 (two) times daily., Disp: 180 tablet, Rfl: 6      Objective:   Vitals:   10/23/23 1622  BP: (!) 94/59  Pulse: 96  SpO2: 95%  Weight:  186 lb 12.8 oz (84.7 kg)  Height: 5\' 8"  (1.727 m)    Estimated body mass index is 28.4 kg/m as calculated from the following:   Height as of this encounter: 5\' 8"  (1.727 m).   Weight as of this  encounter: 186 lb 12.8 oz (84.7 kg).  @WEIGHTCHANGE @  [employer]   10/23/23 1622  Weight: 186 lb 12.8 oz (84.7 kg)     Physical Exam   General: No distress. Loks well O2 at rest: no Cane present: YES Sitting in wheel chair: no Frail: no Obese: no Neuro: Alert and Oriented x 3. GCS 15. Speech normal Psych: Pleasant Resp:  Barrel Chest - no.  Wheeze - no, Crackles - no, No overt respiratory distress CVS: Normal heart sounds. Murmurs - no Ext: Stigmata of Connective Tissue Disease - no HEENT: Normal upper airway. PEERL +. No post nasal drip        Assessment:       ICD-10-CM   1. ILD (interstitial lung disease) (HCC)  J84.9 CBC with Differential    Basic Metabolic Panel (BMET)    Hepatic function panel    Pulmonary function test    2. IPF (idiopathic pulmonary fibrosis) (HCC)  J84.112     3. Medication monitoring encounter  Z51.81     4. Encounter for therapeutic drug monitoring  Z51.81     5. Vaccine counseling  Z71.85     6. Flu vaccine need  Z23          Plan:     Patient Instructions  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.  Worse 2023 0> April 2024 -> stable through aug 2024 and since them on esbriet low dose protocol -> stable clinically as of Nov 2024  Plan - check CBC, BMET, LFT 10/23/2023 - continue  pirfenidone low dose protocol - do not qualify for clinical trials due to memory issues - sprimetry and dlco in 16 weeks  VAccine counseling Flu vaccine  Plan - high dose flu shot 10/23/2023    Pedal edema Severe tricuspid regurgitation Right ventricular dysfunction  -  RHC 2024 shows pulmonary venous congenstion related incrased pressures - glad you are better  Plan   = per  cardiology    Hx of OSA  Plan  - continue CPAP through sleep doctors    Follow-up - Return in the next 16 weeks  =- after spirometry and dlco  - 15 min visit   FOLLOWUP Return in about 16 weeks (around 02/12/2024) for 15 min visit, after Cleda Daub and DLCO, ILD, Face to Face Visit, with Dr Marchelle Gearing.    SIGNATURE    Dr. Kalman Shan, M.D., F.C.C.P,  Pulmonary and Critical Care Medicine Staff Physician, [facility] Center Director - Interstitial Lung Disease  Program  Pulmonary Fibrosis [facility] at [facility] [place], [place], [address]  Pager: [phone], If no answer or between  15:00h - 7:00h: call [phone] Telephone: [phone]  4:49 PM 10/23/2023

## 2023-10-23 ENCOUNTER — Encounter: Payer: Self-pay | Admitting: Internal Medicine

## 2023-10-23 ENCOUNTER — Ambulatory Visit (INDEPENDENT_AMBULATORY_CARE_PROVIDER_SITE_OTHER): Payer: Medicare Other | Admitting: Internal Medicine

## 2023-10-23 ENCOUNTER — Other Ambulatory Visit: Payer: Self-pay

## 2023-10-23 VITALS — BP 94/59 | HR 96 | Ht 68.0 in | Wt 186.8 lb

## 2023-10-23 DIAGNOSIS — Z5181 Encounter for therapeutic drug level monitoring: Secondary | ICD-10-CM

## 2023-10-23 DIAGNOSIS — Z7185 Encounter for immunization safety counseling: Secondary | ICD-10-CM

## 2023-10-23 DIAGNOSIS — Z23 Encounter for immunization: Secondary | ICD-10-CM | POA: Diagnosis not present

## 2023-10-23 DIAGNOSIS — J849 Interstitial pulmonary disease, unspecified: Secondary | ICD-10-CM | POA: Diagnosis not present

## 2023-10-23 DIAGNOSIS — J84112 Idiopathic pulmonary fibrosis: Secondary | ICD-10-CM

## 2023-10-25 ENCOUNTER — Other Ambulatory Visit: Payer: Medicare Other

## 2023-10-25 DIAGNOSIS — J849 Interstitial pulmonary disease, unspecified: Secondary | ICD-10-CM

## 2023-10-25 LAB — CBC WITH DIFFERENTIAL/PLATELET
Basophils Absolute: 0 10*3/uL (ref 0.0–0.1)
Basophils Relative: 0.8 % (ref 0.0–3.0)
Eosinophils Absolute: 0.1 10*3/uL (ref 0.0–0.7)
Eosinophils Relative: 2.8 % (ref 0.0–5.0)
HCT: 32.6 % — ABNORMAL LOW (ref 39.0–52.0)
Hemoglobin: 11.3 g/dL — ABNORMAL LOW (ref 13.0–17.0)
Lymphocytes Relative: 20.7 % (ref 12.0–46.0)
Lymphs Abs: 0.9 10*3/uL (ref 0.7–4.0)
MCHC: 34.7 g/dL (ref 30.0–36.0)
MCV: 98.6 fL (ref 78.0–100.0)
Monocytes Absolute: 0.2 10*3/uL (ref 0.1–1.0)
Monocytes Relative: 4.7 % (ref 3.0–12.0)
Neutro Abs: 3.2 10*3/uL (ref 1.4–7.7)
Neutrophils Relative %: 71 % (ref 43.0–77.0)
Platelets: 86 10*3/uL — ABNORMAL LOW (ref 150.0–400.0)
RBC: 3.31 Mil/uL — ABNORMAL LOW (ref 4.22–5.81)
RDW: 14.6 % (ref 11.5–15.5)
WBC: 4.5 10*3/uL (ref 4.0–10.5)

## 2023-10-25 LAB — BASIC METABOLIC PANEL
BUN: 25 mg/dL — ABNORMAL HIGH (ref 6–23)
CO2: 31 meq/L (ref 19–32)
Calcium: 9.3 mg/dL (ref 8.4–10.5)
Chloride: 101 meq/L (ref 96–112)
Creatinine, Ser: 1.51 mg/dL — ABNORMAL HIGH (ref 0.40–1.50)
GFR: 43.26 mL/min — ABNORMAL LOW (ref >60.00)
Glucose, Bld: 93 mg/dL (ref 70–99)
Potassium: 3.1 meq/L — ABNORMAL LOW (ref 3.5–5.1)
Sodium: 140 meq/L (ref 135–145)

## 2023-10-25 LAB — HEPATIC FUNCTION PANEL
ALT: 14 U/L (ref 0–53)
AST: 25 U/L (ref 0–37)
Albumin: 3.8 g/dL (ref 3.5–5.2)
Alkaline Phosphatase: 75 U/L (ref 39–117)
Bilirubin, Direct: 0.2 mg/dL (ref 0.0–0.3)
Total Bilirubin: 0.6 mg/dL (ref 0.2–1.2)
Total Protein: 7.3 g/dL (ref 6.0–8.3)

## 2023-10-26 ENCOUNTER — Telehealth: Payer: Self-pay | Admitting: Internal Medicine

## 2023-10-26 ENCOUNTER — Other Ambulatory Visit: Payer: Self-pay

## 2023-10-26 NOTE — Telephone Encounter (Signed)
K is low - but patient on various meds that can adjutt the K level up and down  Plan  - he should call his PCP or whoever prescribed his diuretics   Recent Labs  Lab 10/25/23 1427  NA 140  K 3.1*  CL 101  CO2 31  GLUCOSE 93  BUN 25*  CREATININE 1.51*  CALCIUM 9.3

## 2023-10-29 ENCOUNTER — Other Ambulatory Visit (HOSPITAL_COMMUNITY): Payer: Self-pay | Admitting: Pharmacy Technician

## 2023-10-29 ENCOUNTER — Other Ambulatory Visit (HOSPITAL_COMMUNITY): Payer: Self-pay

## 2023-10-29 NOTE — Progress Notes (Signed)
Specialty Pharmacy Refill Coordination Note  Shadrack Nasri Casucci. is a 82 y.o. male contacted today regarding refills of specialty medication(s) Pirfenidone  Spoke with Wife  Patient requested Delivery   Delivery date: 11/07/23   Verified address: [address]  [place]   Medication will be filled on 11/06/23.

## 2023-11-06 ENCOUNTER — Other Ambulatory Visit: Payer: Self-pay

## 2023-11-07 ENCOUNTER — Encounter (HOSPITAL_COMMUNITY): Payer: Self-pay | Admitting: Cardiology

## 2023-11-07 ENCOUNTER — Ambulatory Visit (HOSPITAL_COMMUNITY)
Admission: RE | Admit: 2023-11-07 | Discharge: 2023-11-07 | Disposition: A | Discharge status: Home / Self Care | Payer: Medicare Other | Source: Ambulatory Visit | Attending: Cardiology | Admitting: Cardiology

## 2023-11-07 VITALS — BP 110/68 | HR 76 | Wt 187.8 lb

## 2023-11-07 DIAGNOSIS — I4821 Permanent atrial fibrillation: Secondary | ICD-10-CM | POA: Insufficient documentation

## 2023-11-07 DIAGNOSIS — I5032 Chronic diastolic (congestive) heart failure: Secondary | ICD-10-CM | POA: Insufficient documentation

## 2023-11-07 DIAGNOSIS — Z951 Presence of aortocoronary bypass graft: Secondary | ICD-10-CM | POA: Diagnosis not present

## 2023-11-07 DIAGNOSIS — I495 Sick sinus syndrome: Secondary | ICD-10-CM | POA: Diagnosis not present

## 2023-11-07 DIAGNOSIS — I272 Pulmonary hypertension, unspecified: Secondary | ICD-10-CM | POA: Diagnosis not present

## 2023-11-07 DIAGNOSIS — R9431 Abnormal electrocardiogram [ECG] [EKG]: Secondary | ICD-10-CM | POA: Insufficient documentation

## 2023-11-07 DIAGNOSIS — G4733 Obstructive sleep apnea (adult) (pediatric): Secondary | ICD-10-CM | POA: Diagnosis not present

## 2023-11-07 DIAGNOSIS — I361 Nonrheumatic tricuspid (valve) insufficiency: Secondary | ICD-10-CM | POA: Insufficient documentation

## 2023-11-07 DIAGNOSIS — J849 Interstitial pulmonary disease, unspecified: Secondary | ICD-10-CM | POA: Diagnosis not present

## 2023-11-07 DIAGNOSIS — Z87891 Personal history of nicotine dependence: Secondary | ICD-10-CM | POA: Diagnosis not present

## 2023-11-07 DIAGNOSIS — I2510 Atherosclerotic heart disease of native coronary artery without angina pectoris: Secondary | ICD-10-CM | POA: Insufficient documentation

## 2023-11-07 DIAGNOSIS — I50812 Chronic right heart failure: Secondary | ICD-10-CM | POA: Diagnosis not present

## 2023-11-07 DIAGNOSIS — I130 Hypertensive heart and chronic kidney disease with heart failure and stage 1 through stage 4 chronic kidney disease, or unspecified chronic kidney disease: Secondary | ICD-10-CM | POA: Insufficient documentation

## 2023-11-07 DIAGNOSIS — Z950 Presence of cardiac pacemaker: Secondary | ICD-10-CM | POA: Diagnosis not present

## 2023-11-07 LAB — BASIC METABOLIC PANEL
Anion gap: 9 (ref 5–15)
BUN: 17 mg/dL (ref 8–23)
CO2: 30 mmol/L (ref 22–32)
Calcium: 9.3 mg/dL (ref 8.9–10.3)
Chloride: 103 mmol/L (ref 98–111)
Creatinine, Ser: 1.49 mg/dL — ABNORMAL HIGH (ref 0.61–1.24)
GFR, Estimated: 47 mL/min — ABNORMAL LOW (ref >60)
Glucose, Bld: 103 mg/dL — ABNORMAL HIGH (ref 70–99)
Potassium: 3.3 mmol/L — ABNORMAL LOW (ref 3.5–5.1)
Sodium: 142 mmol/L (ref 135–145)

## 2023-11-07 LAB — BRAIN NATRIURETIC PEPTIDE: B Natriuretic Peptide: 121.7 pg/mL — ABNORMAL HIGH (ref 0.0–100.0)

## 2023-11-07 LAB — DIGOXIN LEVEL: Digoxin Level: 0.3 ng/mL — ABNORMAL LOW (ref 0.8–2.0)

## 2023-11-07 MED ORDER — POTASSIUM CHLORIDE CRYS ER 20 MEQ PO TBCR: 20.0000 meq | EXTENDED_RELEASE_TABLET | Freq: Every day | ORAL | 3 refills | Status: DC

## 2023-11-07 MED ORDER — TORSEMIDE 20 MG PO TABS
ORAL_TABLET | ORAL | 6 refills | Status: AC
Start: 2023-11-07 — End: ?

## 2023-11-07 MED ORDER — SPIRONOLACTONE 25 MG PO TABS
25.0000 mg | ORAL_TABLET | Freq: Every day | ORAL | 3 refills | Status: DC
Start: 2023-11-07 — End: 2024-08-12

## 2023-11-07 NOTE — Progress Notes (Signed)
PCP: Merri Brunette, MD Cardiology: Dr. Jens Som HF Cardiology: Dr. Shirlee Latch  82 y.o. with history of permanent atrial fibrillation, CAD s/p CABG, St Jude PPM, pulmonary fibrosis, and chronic RV failure with severe TR was referred by Dr. Marchelle Gearing for evaluation of CHF/RV failure. Patient had CABG and Maze in 1/09.  In 8/13, he had a subdural hematoma while on warfarin, craniotomy done.  He has been off anticoagulation since that time.  His atrial fibrillation is now permanent.  He has a Secondary school teacher PPM for tachy/brady syndrome.  He is followed by Dr. Marchelle Gearing for interstitial lung disease, CT chest consistent with UIP.  He is not on anti-fibrotics.   Echoes going back to 2020 have shown RV dysfunction with severe TR. Most recent echo in 3/24 showed EF 55-60%, D-shaped septum with moderate RV enlargement and moderate RV dysfunction, severe TR, dilated IVC.  RHC was done in 6/24, showing mean RA 22 with prominent CV waves, PA 44/22 mean 33, mean PCWP 19, CI 2.29, PVR 3.1 WU, PAPi 1 => predominantly pulmonary venous hypertension with significant RV dysfunction.  Torsemide was increased to 40 mg bid.   Patient presents for followup of CHF.  He is short of breath walking 50-100 feet or walking up stairs. Ok with ADLs.  No chest pain or lightheadedness.  He fell once this month (tripped). No orthopnea/PND.  Weight up 2 lbs.   Device interrogation (personally reviewed): 9.4% V paced, permanent afib   ECG (personally reviewed): Atrial fibrillation, RBBB  Labs (5/24): K 3.6, creatinine 1.37 Labs (7/24): K 4.8, creatinine 1.4 Labs (08/24): K 3.8, creatinine 1.65 Labs (9/24): digoxin 0.7 Labs (11/24): K 3.1, creatinine 1.5  PMH: 1. Atrial fibrillation: Maze in 1/09 with CABG.  AF now permanent. Not anticoagulated since subdural hemorrhage.  2. Subdural hematoma: 8/13, patient was on warfarin at the time.  Had craniotomy.  He has been off anticoagulation since then.  3. CAD: s/p CABG 1/09 with LIMA-LAD,  SVG-OM2, SVG-PDA.  4. Interstitial lung disease: Pulmonary fibrosis, UIP pattern.  Sees Dr. Marchelle Gearing.  - CT chest (3/24): Fibrotic ILD.  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 DM2 9. HTN 10. Hyperlipidemia 11. Testicular cancer: s/p orchiectomy 12. OSA: CPAP 13. 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  - Echo (11/22): severe TR, RV dysfunction.  - Echo (3/24): EF 55-60%, D-shaped septum with moderate RV enlargement and moderate RV dysfunction, severe TR, dilated IVC.  - RHC (6/24): mean RA 22 with prominent CV waves, PA 44/22 mean 33, mean PCWP 19, CI 2.29, PVR 3.1 WU, PAPi 1 => predominantly pulmonary venous hypertension with significant RV dysfunction.  14. CKD stage 3  Family History  Problem Relation Age of Onset   Heart disease Father    Heart attack Father    Heart failure Father    Heart disease Mother    Alzheimer's disease Mother    Dementia Mother    Social History   Socioeconomic History   Marital status: Married    Spouse name: Engineer, maintenance (IT)   Number of children: 2   Years of education: Not on file   Highest education level: Not on file  Occupational History   Occupation: Retired- Theatre stage manager  Tobacco Use   Smoking status: Former    Current packs/day: 0.00    Average packs/day: 2.0 packs/day for 20.0 years (40.0 ttl pk-yrs)    Types: Cigarettes  Start date: 02/21/1971    Quit date: 02/21/1991    Years since quitting: 32.7   Smokeless tobacco: Never  Vaping Use   Vaping status: Never Used  Substance and Sexual Activity   Alcohol use: No    Alcohol/week: 0.0 standard drinks of alcohol   Drug use: No   Sexual activity: Not Currently  Other Topics Concern   Not on file  Social History Narrative   Lives with wife   Right handed    Married with two children.     He is a Emergency planning/management officer.     Social Determinants of Health   Financial Resource Strain: Not on file   Food Insecurity: Not on file  Transportation Needs: Not on file  Physical Activity: Not on file  Stress: Not on file  Social Connections: Not on file  Intimate Partner Violence: Not on file   ROS: All systems reviewed and negative except as per HPI.   Current Outpatient Medications  Medication Sig Dispense Refill   allopurinol (ZYLOPRIM) 300 MG tablet Take 1 tablet (300 mg total) by mouth daily. 30 tablet 0   colchicine 0.6 MG tablet      dapagliflozin propanediol (FARXIGA) 10 MG TABS tablet Take 1 tablet (10 mg total) by mouth daily before breakfast. 30 tablet 11   digoxin (LANOXIN) 0.125 MG tablet TAKE ONE TABLET BY MOUTH MONDAY THROUGH FRIDAY. DO NOT TAKE ON SATURDAY OR SUNDAY. 90 tablet 3   ezetimibe (ZETIA) 10 MG tablet Take 1 tablet (10 mg total) by mouth daily. 90 tablet 3   ferrous sulfate 325 (65 FE) MG tablet Take 1 tablet (325 mg total) by mouth daily with breakfast. 30 tablet 3   memantine (NAMENDA) 10 MG tablet Take 10 mg by mouth 2 (two) times daily.     metoprolol succinate (TOPROL-XL) 25 MG 24 hr tablet Take 25 mg by mouth daily.     mirabegron ER (MYRBETRIQ) 50 MG TB24 tablet Take 1 tablet (50 mg total) by mouth daily. 30 tablet 3   modafinil (PROVIGIL) 100 MG tablet TAKE 1 TABLET BY MOUTH EVERY DAY 30 tablet 5   Multiple Vitamin (MULTIVITAMIN WITH MINERALS) TABS tablet Take 1 tablet by mouth daily.     NON FORMULARY Pt uses a cpap nightly     pantoprazole (PROTONIX) 40 MG tablet Take 1 tablet (40 mg total) by mouth daily. 30 tablet 0   Pirfenidone 267 MG TABS Take 2 tablets (534 mg total) by mouth 3 (three) times daily with meals. **low dose as maintenance** 180 tablet 5   potassium chloride SA (KLOR-CON M) 20 MEQ tablet Take 1 tablet (20 mEq total) by mouth daily. 90 tablet 3   rosuvastatin (CRESTOR) 40 MG tablet Take 1 tablet (40 mg total) by mouth daily. 90 tablet 2   sertraline (ZOLOFT) 100 MG tablet Take 100 mg by mouth daily.     silver sulfADIAZINE (SILVADENE) 1  % cream Apply 1 Application topically as needed.     tamsulosin (FLOMAX) 0.4 MG CAPS capsule Take 1 capsule (0.4 mg total) by mouth at bedtime. 30 capsule 1   Teriparatide, Recombinant, 620 MCG/2.48ML SOPN inject 20 mcg Subcutaneous Once a day 1 mL 11   spironolactone (ALDACTONE) 25 MG tablet Take 1 tablet (25 mg total) by mouth daily. 90 tablet 3   torsemide (DEMADEX) 20 MG tablet Take 4 tablets (80 mg total) by mouth every morning AND 3 tablets (60 mg total) every evening. 180 tablet 6   No  current facility-administered medications for this encounter.   Wt Readings from Last 3 Encounters:  11/07/23 85.2 kg (187 lb 12.8 oz)  10/23/23 84.7 kg (186 lb 12.8 oz)  10/03/23 82.3 kg (181 lb 6.4 oz)   BP 110/68   Pulse 76   Wt 85.2 kg (187 lb 12.8 oz)   SpO2 99%   BMI 28.55 kg/m  General: NAD Neck: JVP 8-9 cm, no thyromegaly or thyroid nodule.  Lungs: Clear to auscultation bilaterally with normal respiratory effort. CV: Nondisplaced PMI.  Heart irregular S1/S2, no S3/S4, 2/6 HSM LLSB.  1+ edema 1/2 up lower legs.  No carotid bruit.  Normal pedal pulses.  Abdomen: Soft, nontender, no hepatosplenomegaly, no distention.  Skin: Intact without lesions or rashes.  Neurologic: Alert and oriented x 3.  Psych: Normal affect. Extremities: No clubbing or cyanosis.  HEENT: Normal.   Assessment/Plan: 1. Chronic diastolic CHF: With prominent RV failure and severe TR.  Echoes since 2020 have shown RV failure and severe TR.  Most recent echo in 3/24 showed EF 55-60%, D-shaped septum with moderate RV enlargement and moderate RV dysfunction, severe TR, dilated IVC.  RHC was done in 6/24, showing mean RA 22 with prominent CV waves, PA 44/22 mean 33, mean PCWP 19, CI 2.29, PVR 3.1 WU, PAPi 1 => predominantly pulmonary venous hypertension with significant RV dysfunction.  NYHA class III, he is volume overloaded on exam today.  - Increase torsemide to 80 qam/60 qpm and add KCl 20 daily.  BMET/BNP today and BMET  in 10 days.  - Increase spironolactone to 25 mg daily.  - Continue Farxiga 10 mg daily.  - Continue digoxin for RV support. Check level today.  2. 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  Cause uncertain, could be atrial functional TR with long-standing atrial fibrillation.  Suspect this has significantly worsened his RV failure.   - With his age, frailty, and dementia, I do not think there are options to surgically or percutaneously treat his TR.  - Focus on decongestion.  3. Pulmonary hypertension: Mild on RHC, predominantly pulmonary venous hypertension due to LV diastolic dysfunction.  - Treatment will be diuresis.  4. Interstitial lung disease: UIP, followed by Dr. Marchelle Gearing.  Now on pirfenidone. With pulmonary venous hypertension, not a candidate for Tyvaso.  5. Atrial fibrillation: Permanent.  Rate is controlled.  - Continue Toprol XL 25 daily and digoxin.  - He has been deemed not a candidate for anticoagulation after subdural hemorrhage in the past.  6. 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   7. CAD: s/p CABG 1/09.  No chest pain.  - Continue Crestor and Zetia.  8. OSA: Continue CPAP.   Follow up 1 month with APP  Marca Ancona  11/07/2023

## 2023-11-07 NOTE — Patient Instructions (Signed)
START Potassium 20 mEq ( 1 tab ) daily.   CHANGE Torsemide to 80 mg in the morning and 60 mg in the evening.  INCREASE Spironolactone to 25 mg daily.  Labs done today, your results will be available in MyChart, we will contact you for abnormal readings.  Repeat blood work in 10 days.  Your physician recommends that you schedule a follow-up appointment in: 1 month  If you have any questions or concerns before your next appointment please send [place] a message through [place] or call our office at [phone].    TO LEAVE A MESSAGE FOR THE NURSE SELECT OPTION 2, PLEASE LEAVE A MESSAGE INCLUDING: YOUR NAME DATE OF BIRTH CALL BACK NUMBER REASON FOR CALL**this is important as we prioritize the call backs  YOU WILL RECEIVE A CALL BACK THE SAME DAY AS LONG AS YOU CALL BEFORE 4:00 PM  At the [facility], you and your health needs are our priority. As part of our continuing mission to provide you with exceptional heart care, we have created designated Provider Care Teams. These Care Teams include your primary Cardiologist (physician) and Advanced Practice Providers (APPs- Physician Assistants and Nurse Practitioners) who all work together to provide you with the care you need, when you need it.   You may see any of the following providers on your designated Care Team at your next follow up: Dr Arvilla Meres Dr Marca Ancona Dr. Dorthula Nettles Dr. Clearnce Hasten Amy Filbert Schilder, NP Robbie Lis, [place] [facility] [place], [place] Brynda Peon, NP [place] Lee, NP Karle Plumber, PharmD   Please be sure to bring in all your medications bottles to every appointment.    Thank you for choosing [facility]

## 2023-11-09 ENCOUNTER — Encounter (HOSPITAL_COMMUNITY): Payer: Medicare Other

## 2023-11-13 ENCOUNTER — Inpatient Hospital Stay: Payer: Medicare Other | Attending: Internal Medicine

## 2023-11-13 DIAGNOSIS — D509 Iron deficiency anemia, unspecified: Secondary | ICD-10-CM | POA: Insufficient documentation

## 2023-11-13 DIAGNOSIS — D472 Monoclonal gammopathy: Secondary | ICD-10-CM | POA: Diagnosis not present

## 2023-11-13 DIAGNOSIS — D61818 Other pancytopenia: Secondary | ICD-10-CM

## 2023-11-13 LAB — CMP (CANCER CENTER ONLY)
ALT: 20 U/L (ref 0–44)
AST: 34 U/L (ref 15–41)
Albumin: 4.1 g/dL (ref 3.5–5.0)
Alkaline Phosphatase: 80 U/L (ref 38–126)
Anion gap: 6 (ref 5–15)
BUN: 24 mg/dL — ABNORMAL HIGH (ref 8–23)
CO2: 35 mmol/L — ABNORMAL HIGH (ref 22–32)
Calcium: 10 mg/dL (ref 8.9–10.3)
Chloride: 101 mmol/L (ref 98–111)
Creatinine: 1.74 mg/dL — ABNORMAL HIGH (ref 0.61–1.24)
GFR, Estimated: 39 mL/min — ABNORMAL LOW (ref >60)
Glucose, Bld: 96 mg/dL (ref 70–99)
Potassium: 3.4 mmol/L — ABNORMAL LOW (ref 3.5–5.1)
Sodium: 142 mmol/L (ref 135–145)
Total Bilirubin: 0.6 mg/dL (ref <1.2)
Total Protein: 8.2 g/dL — ABNORMAL HIGH (ref 6.5–8.1)

## 2023-11-13 LAB — CBC WITH DIFFERENTIAL (CANCER CENTER ONLY)
Abs Immature Granulocytes: 0.01 10*3/uL (ref 0.00–0.07)
Basophils Absolute: 0.1 10*3/uL (ref 0.0–0.1)
Basophils Relative: 1 %
Eosinophils Absolute: 0.2 10*3/uL (ref 0.0–0.5)
Eosinophils Relative: 3 %
HCT: 36.7 % — ABNORMAL LOW (ref 39.0–52.0)
Hemoglobin: 12.3 g/dL — ABNORMAL LOW (ref 13.0–17.0)
Immature Granulocytes: 0 %
Lymphocytes Relative: 21 %
Lymphs Abs: 0.9 10*3/uL (ref 0.7–4.0)
MCH: 32.9 pg (ref 26.0–34.0)
MCHC: 33.5 g/dL (ref 30.0–36.0)
MCV: 98.1 fL (ref 80.0–100.0)
Monocytes Absolute: 0.2 10*3/uL (ref 0.1–1.0)
Monocytes Relative: 4 %
Neutro Abs: 3 10*3/uL (ref 1.7–7.7)
Neutrophils Relative %: 71 %
Platelet Count: 96 10*3/uL — ABNORMAL LOW (ref 150–400)
RBC: 3.74 MIL/uL — ABNORMAL LOW (ref 4.22–5.81)
RDW: 13.7 % (ref 11.5–15.5)
WBC Count: 4.4 10*3/uL (ref 4.0–10.5)
nRBC: 0 % (ref 0.0–0.2)

## 2023-11-13 LAB — IRON AND IRON BINDING CAPACITY (CC-WL,HP ONLY)
Iron: 87 ug/dL (ref 45–182)
Saturation Ratios: 27 % (ref 17.9–39.5)
TIBC: 325 ug/dL (ref 250–450)
UIBC: 238 ug/dL (ref 117–376)

## 2023-11-13 LAB — FERRITIN: Ferritin: 101 ng/mL (ref 24–336)

## 2023-11-14 LAB — KAPPA/LAMBDA LIGHT CHAINS
Kappa free light chain: 112.8 mg/L — ABNORMAL HIGH (ref 3.3–19.4)
Kappa, lambda light chain ratio: 2.88 — ABNORMAL HIGH (ref 0.26–1.65)
Lambda free light chains: 39.2 mg/L — ABNORMAL HIGH (ref 5.7–26.3)

## 2023-11-19 ENCOUNTER — Ambulatory Visit (HOSPITAL_COMMUNITY)
Admission: RE | Admit: 2023-11-19 | Discharge: 2023-11-19 | Disposition: A | Discharge status: Home / Self Care | Payer: Medicare Other | Source: Ambulatory Visit | Attending: Cardiology | Admitting: Cardiology

## 2023-11-19 DIAGNOSIS — I5032 Chronic diastolic (congestive) heart failure: Secondary | ICD-10-CM | POA: Insufficient documentation

## 2023-11-19 DIAGNOSIS — M810 Age-related osteoporosis without current pathological fracture: Secondary | ICD-10-CM | POA: Diagnosis not present

## 2023-11-19 LAB — MULTIPLE MYELOMA PANEL, SERUM
Albumin SerPl Elph-Mcnc: 3.8 g/dL (ref 2.9–4.4)
Albumin/Glob SerPl: 1.1 (ref 0.7–1.7)
Alpha 1: 0.2 g/dL (ref 0.0–0.4)
Alpha2 Glob SerPl Elph-Mcnc: 0.7 g/dL (ref 0.4–1.0)
B-Globulin SerPl Elph-Mcnc: 1.3 g/dL (ref 0.7–1.3)
Gamma Glob SerPl Elph-Mcnc: 1.6 g/dL (ref 0.4–1.8)
Globulin, Total: 3.8 g/dL (ref 2.2–3.9)
IgA: 777 mg/dL — ABNORMAL HIGH (ref 61–437)
IgG (Immunoglobin G), Serum: 1585 mg/dL (ref 603–1613)
IgM (Immunoglobulin M), Srm: 97 mg/dL (ref 15–143)
M Protein SerPl Elph-Mcnc: 0.3 g/dL — ABNORMAL HIGH
Total Protein ELP: 7.6 g/dL (ref 6.0–8.5)

## 2023-11-19 LAB — BASIC METABOLIC PANEL
Anion gap: 9 (ref 5–15)
BUN: 22 mg/dL (ref 8–23)
CO2: 29 mmol/L (ref 22–32)
Calcium: 9.5 mg/dL (ref 8.9–10.3)
Chloride: 104 mmol/L (ref 98–111)
Creatinine, Ser: 1.63 mg/dL — ABNORMAL HIGH (ref 0.61–1.24)
GFR, Estimated: 42 mL/min — ABNORMAL LOW (ref >60)
Glucose, Bld: 95 mg/dL (ref 70–99)
Potassium: 3.8 mmol/L (ref 3.5–5.1)
Sodium: 142 mmol/L (ref 135–145)

## 2023-11-20 ENCOUNTER — Other Ambulatory Visit: Payer: Self-pay

## 2023-11-23 ENCOUNTER — Encounter: Payer: Self-pay | Admitting: Hematology and Oncology

## 2023-11-23 ENCOUNTER — Inpatient Hospital Stay: Payer: Medicare Other | Attending: Hematology and Oncology | Admitting: Hematology and Oncology

## 2023-11-23 VITALS — BP 107/56 | HR 91 | Temp 97.5°F | Resp 18 | Ht 68.0 in | Wt 184.0 lb

## 2023-11-23 DIAGNOSIS — D696 Thrombocytopenia, unspecified: Secondary | ICD-10-CM | POA: Diagnosis not present

## 2023-11-23 DIAGNOSIS — D509 Iron deficiency anemia, unspecified: Secondary | ICD-10-CM | POA: Diagnosis not present

## 2023-11-23 DIAGNOSIS — D472 Monoclonal gammopathy: Secondary | ICD-10-CM | POA: Insufficient documentation

## 2023-11-23 DIAGNOSIS — Z79899 Other long term (current) drug therapy: Secondary | ICD-10-CM | POA: Diagnosis not present

## 2023-11-23 NOTE — Assessment & Plan Note (Signed)
Myeloma panel is stable Light chain studies are elevated due to chronic kidney disease but overall stable Monitor closely in 9 months

## 2023-11-23 NOTE — Assessment & Plan Note (Signed)
His anemia is slightly improved He appears to tolerate oral iron supplement well and he will continue the same

## 2023-11-23 NOTE — Assessment & Plan Note (Signed)
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 Even though the CT imaging report from September 2017 did not disclose this, I reviewed the imaging study with the patient extensively which clearly showed mild liver enlargement and splenomegaly. Rarely, autoimmune disorder that could cause pulmonary fibrosis can also cause mild thrombocytopenia. He is not symptomatic.  Previous autoimmune screen, hepatitis C screening and serum vitamin B12 were within normal limits Observe only for now His platelet count is stable

## 2023-11-23 NOTE — Progress Notes (Signed)
[facility] OFFICE PROGRESS NOTE  Patient Care Team: Merri Brunette, MD as PCP - General Jens Som Madolyn Frieze, MD as PCP - Cardiology (Cardiology) Jens Som Madolyn Frieze, MD as Consulting Physician (Cardiology) Irena Reichmann, DO as Referring Physician (Family Medicine)  ASSESSMENT & PLAN:  [name] (monoclonal gammopathy of unknown significance) Myeloma panel is stable Light chain studies are elevated due to chronic kidney disease but overall stable Monitor closely in 9 months  Iron deficiency anemia His anemia is slightly improved He appears to tolerate oral iron supplement well and he will continue the same  Thrombocytopenia [facility]) 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 Even though the CT imaging report from September 2017 did not disclose this, I reviewed the imaging study with the patient extensively which clearly showed mild liver enlargement and splenomegaly. Rarely, autoimmune disorder that could cause pulmonary fibrosis can also cause mild thrombocytopenia. He is not symptomatic.  Previous autoimmune screen, hepatitis C screening and serum vitamin B12 were within normal limits Observe only for now His platelet count is stable  Orders Placed This Encounter  Procedures   Comprehensive metabolic panel    Standing Status:   Standing    Number of Occurrences:   33    Standing Expiration Date:   11/22/2024   CBC with Differential/Platelet    Standing Status:   Standing    Number of Occurrences:   22    Standing Expiration Date:   11/22/2024   Ferritin    Standing Status:   Standing    Number of Occurrences:   3    Standing Expiration Date:   11/22/2024   Iron and Iron Binding Capacity ([facility] only)    Standing Status:   Standing    Number of Occurrences:   3    Standing Expiration Date:   11/22/2024    All questions were answered. The patient knows to call the clinic with any problems, questions  or concerns. The total time spent in the appointment was 20 minutes encounter with patients including review of chart and various tests results, discussions about plan of care and coordination of care plan   Artis Delay, MD 11/23/2023 11:59 AM  INTERVAL HISTORY: Please see below for problem oriented charting. he returns for surveillance follow-up for thrombocytopenia, iron deficiency and [name] We discussed test results He denies recent infection or bleeding  REVIEW OF SYSTEMS:   Constitutional: Denies fevers, chills or abnormal weight loss Eyes: Denies blurriness of vision Ears, nose, mouth, throat, and face: Denies mucositis or sore throat Respiratory: Denies cough, dyspnea or wheezes Cardiovascular: Denies palpitation, chest discomfort or lower extremity swelling Gastrointestinal:  Denies nausea, heartburn or change in bowel habits Skin: Denies abnormal skin rashes Lymphatics: Denies new lymphadenopathy or easy bruising Neurological:Denies numbness, tingling or new weaknesses Behavioral/Psych: Mood is stable, no new changes  All other systems were reviewed with the patient and are negative.  I have reviewed the past medical history, past surgical history, social history and family history with the patient and they are unchanged from previous note.  ALLERGIES:  is allergic to aricept [donepezil], baclofen, lipitor [atorvastatin], nsaids, warfarin and related, and enbrel [etanercept].  MEDICATIONS:  Current Outpatient Medications  Medication Sig Dispense Refill   allopurinol (ZYLOPRIM) 300 MG tablet Take 1 tablet (300 mg total) by mouth daily. 30 tablet 0   colchicine 0.6 MG tablet      dapagliflozin propanediol (FARXIGA) 10 MG TABS tablet Take  1 tablet (10 mg total) by mouth daily before breakfast. 30 tablet 11   digoxin (LANOXIN) 0.125 MG tablet TAKE ONE TABLET BY MOUTH MONDAY THROUGH FRIDAY. DO NOT TAKE ON SATURDAY OR SUNDAY. 90 tablet 3   ezetimibe (ZETIA) 10 MG tablet Take 1  tablet (10 mg total) by mouth daily. 90 tablet 3   ferrous sulfate 325 (65 FE) MG tablet Take 1 tablet (325 mg total) by mouth daily with breakfast. 30 tablet 3   memantine (NAMENDA) 10 MG tablet Take 10 mg by mouth 2 (two) times daily.     metoprolol succinate (TOPROL-XL) 25 MG 24 hr tablet Take 25 mg by mouth daily.     mirabegron ER (MYRBETRIQ) 50 MG TB24 tablet Take 1 tablet (50 mg total) by mouth daily. 30 tablet 3   modafinil (PROVIGIL) 100 MG tablet TAKE 1 TABLET BY MOUTH EVERY DAY 30 tablet 5   Multiple Vitamin (MULTIVITAMIN WITH MINERALS) TABS tablet Take 1 tablet by mouth daily.     NON FORMULARY Pt uses a cpap nightly     pantoprazole (PROTONIX) 40 MG tablet Take 1 tablet (40 mg total) by mouth daily. 30 tablet 0   Pirfenidone 267 MG TABS Take 2 tablets (534 mg total) by mouth 3 (three) times daily with meals. **low dose as maintenance** 180 tablet 5   potassium chloride SA (KLOR-CON M) 20 MEQ tablet Take 1 tablet (20 mEq total) by mouth daily. 90 tablet 3   rosuvastatin (CRESTOR) 40 MG tablet Take 1 tablet (40 mg total) by mouth daily. 90 tablet 2   sertraline (ZOLOFT) 100 MG tablet Take 100 mg by mouth daily.     silver sulfADIAZINE (SILVADENE) 1 % cream Apply 1 Application topically as needed.     spironolactone (ALDACTONE) 25 MG tablet Take 1 tablet (25 mg total) by mouth daily. 90 tablet 3   tamsulosin (FLOMAX) 0.4 MG CAPS capsule Take 1 capsule (0.4 mg total) by mouth at bedtime. 30 capsule 1   Teriparatide, Recombinant, 620 MCG/2.48ML SOPN inject 20 mcg Subcutaneous Once a day 1 mL 11   torsemide (DEMADEX) 20 MG tablet Take 4 tablets (80 mg total) by mouth every morning AND 3 tablets (60 mg total) every evening. 180 tablet 6   No current facility-administered medications for this visit.    SUMMARY OF ONCOLOGIC HISTORY:  [name] Montez Hageman. is here because of thrombocytopenia. 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   He was found to have abnormal CBC from abnormal blood work from routine blood work monitoring. I have reviewed outside records from his primary care doctor.   He is noted to have mild pancytopenia since 11/17/2015. On 11/17/2015, white blood cell count 5.9, hemoglobin 12.7 and platelet count 106 On 03/13/2016, white blood cell count 6.6, hemoglobin 12.6 and platelet count 103 On 09/06/2015, white blood cell count 6.6, hemoglobin 11.9 and platelet count 102 On 10/25/2016, white count 5.2, hemoglobin 12.7 and platelet count 86 On 11/08/16, white blood cell count 5.3, hemoglobin 12.2 implant, 107. On 09/05/2016, serum ferritin was high at 220, iron study showed serum iron low at 24 and 9% iron saturation.   Some older scanned records dated back to 12/13/2011 showed low platelet count of 101 On 12/21/2011, he had normal platelet count of 210 On 07/30/2012, his platelet count was borderline low at 143   On 09/08/2016, CT scan of the abdomen and pelvis showed  nodular hepatic contour suspicious for possible liver cirrhosis. He is noted to have splenomegaly although this was not reported on the CT (by my review) He denies bleeding, such as spontaneous epistaxis, hematuria, 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  He is being observed Repeat CT imaging of the abdomen and pelvis in August 2019 confirmed liver cirrhosis and splenomegaly He was found to have iron deficiency anemia and was placed on oral  iron supplement The patient also have [name] and is being observed In October 2020, he received intravenous iron infusion  PHYSICAL EXAMINATION: ECOG PERFORMANCE STATUS: 1 - Symptomatic but completely ambulatory  Vitals:   11/23/23 1118  BP: (!) 107/56  Pulse: 91  Resp: 18  Temp: (!) 97.5 F (36.4 C)  SpO2: 99%   Filed Weights   11/23/23 1118  Weight: 184 lb (83.5 kg)    GENERAL:alert, no distress and comfortable   LABORATORY DATA:  I have reviewed the data as listed    Component Value Date/Time   NA 142 11/19/2023 1454   NA 145 (H) 03/29/2023 1203   NA 141 02/23/2017 1237   K 3.8 11/19/2023 1454   K 3.8 02/23/2017 1237   CL 104 11/19/2023 1454   CO2 29 11/19/2023 1454   CO2 27 02/23/2017 1237   GLUCOSE 95 11/19/2023 1454   GLUCOSE 113 02/23/2017 1237   BUN 22 11/19/2023 1454   BUN 31 (H) 03/29/2023 1203   BUN 18.3 02/23/2017 1237   CREATININE 1.63 (H) 11/19/2023 1454   CREATININE 1.74 (H) 11/13/2023 1107   CREATININE 1.2 02/23/2017 1237   CALCIUM 9.5 11/19/2023 1454   CALCIUM 9.6 02/23/2017 1237   PROT 8.2 (H) 11/13/2023 1107   PROT 7.8 03/29/2023 1203   PROT 8.0 02/23/2017 1237   ALBUMIN 4.1 11/13/2023 1107   ALBUMIN 4.0 03/29/2023 1203   ALBUMIN 4.1 02/23/2017 1237   AST 34 11/13/2023 1107   AST 24 02/23/2017 1237   ALT 20 11/13/2023 1107   ALT 15 02/23/2017 1237   ALKPHOS 80 11/13/2023 1107   ALKPHOS 105 02/23/2017 1237   BILITOT 0.6 11/13/2023 1107   BILITOT 0.98 02/23/2017 1237   GFRNONAA 42 (L) 11/19/2023 1454   GFRNONAA 39 (L) 11/13/2023 1107   GFRAA 72 02/01/2021 1410    No results found for: "SPEP", "UPEP"  Lab Results  Component Value Date   WBC 4.4 11/13/2023   NEUTROABS 3.0 11/13/2023   HGB 12.3 (L) 11/13/2023   HCT 36.7 (L) 11/13/2023   MCV 98.1 11/13/2023   PLT 96 (L) 11/13/2023      Chemistry      Component Value Date/Time   NA 142 11/19/2023 1454   NA 145 (H) 03/29/2023 1203   NA 141 02/23/2017 1237   K 3.8 11/19/2023  1454   K 3.8 02/23/2017 1237   CL 104 11/19/2023 1454   CO2 29 11/19/2023 1454   CO2 27 02/23/2017 1237   BUN 22 11/19/2023 1454   BUN 31 (H) 03/29/2023 1203   BUN 18.3 02/23/2017 1237   CREATININE 1.63 (H) 11/19/2023 1454  CREATININE 1.74 (H) 11/13/2023 1107   CREATININE 1.2 02/23/2017 1237      Component Value Date/Time   CALCIUM 9.5 11/19/2023 1454   CALCIUM 9.6 02/23/2017 1237   ALKPHOS 80 11/13/2023 1107   ALKPHOS 105 02/23/2017 1237   AST 34 11/13/2023 1107   AST 24 02/23/2017 1237   ALT 20 11/13/2023 1107   ALT 15 02/23/2017 1237   BILITOT 0.6 11/13/2023 1107   BILITOT 0.98 02/23/2017 1237

## 2023-11-28 ENCOUNTER — Other Ambulatory Visit: Payer: Self-pay

## 2023-12-04 ENCOUNTER — Other Ambulatory Visit: Payer: Self-pay

## 2023-12-10 ENCOUNTER — Other Ambulatory Visit (HOSPITAL_COMMUNITY): Payer: Self-pay

## 2023-12-10 ENCOUNTER — Encounter (HOSPITAL_COMMUNITY): Payer: Self-pay

## 2023-12-11 ENCOUNTER — Encounter (HOSPITAL_COMMUNITY): Payer: Medicare Other

## 2023-12-13 ENCOUNTER — Other Ambulatory Visit: Payer: Self-pay

## 2023-12-13 NOTE — Progress Notes (Signed)
Specialty Pharmacy Refill Coordination Note  [name]. is a 82 y.o. male contacted today regarding refills of specialty medication(s) Pirfenidone   Patient requested Delivery   Delivery date: 12/14/23   Verified address: [address] [place] [address]   Medication will be filled on 12/13/23.

## 2023-12-17 ENCOUNTER — Ambulatory Visit (INDEPENDENT_AMBULATORY_CARE_PROVIDER_SITE_OTHER): Payer: Medicare Other | Admitting: Podiatry

## 2023-12-17 DIAGNOSIS — M79674 Pain in right toe(s): Secondary | ICD-10-CM

## 2023-12-17 DIAGNOSIS — B351 Tinea unguium: Secondary | ICD-10-CM | POA: Diagnosis not present

## 2023-12-17 DIAGNOSIS — L84 Corns and callosities: Secondary | ICD-10-CM

## 2023-12-17 DIAGNOSIS — M79675 Pain in left toe(s): Secondary | ICD-10-CM | POA: Diagnosis not present

## 2023-12-17 DIAGNOSIS — E1149 Type 2 diabetes mellitus with other diabetic neurological complication: Secondary | ICD-10-CM

## 2023-12-17 NOTE — Progress Notes (Signed)
Subjective: Chief Complaint  Patient presents with   [facility]    RM#11 DFC/Callus right foot    82 year old male presents the office today for evaluation of preulcerative callus on the right foot submetatarsal 1 and for long toenails he is not able to trim himself may cause discomfort like elongated.  He does not report any open lesions.  No pain although he does have neuropathy.   Objective: AAO x3, NAD- presents with wife. DP/PT pulses palpable 1/4 b/l, CRT less than 3 seconds Sensation decreased with Semmes Weinstein monofilament. Hyperkeratotic lesion present right foot submetatarsal 1.  Upon debridement there is no underlying ulceration, drainage or any signs of infection but the area is preulcerative.  No signs of infection today. No other open lesions noted.  The nails appear to be hypertrophic, dystrophic to the left 1, 3, 5 in the right 1, 3, 5.  He has tenderness his nails are thickened elongated.  No edema, erythema or signs of infection. No pain with calf compression, swelling, warmth, erythema   Assessment: Preulcerative lesion right foot, symptomatic onychomycosis  Plan: -Sharp debrided the preulcerative callus x 1 on the right foot any complications or bleeding.  Area is preulcerative still needs to monitor closely.  Offloading at all times.  Continue with inserts with offloading. -Sharply debride the nails x 6 without any complications or bleeding -Daily foot inspection.  Return in about 3 months (around 03/16/2024).  [name] DPM

## 2023-12-18 ENCOUNTER — Ambulatory Visit (INDEPENDENT_AMBULATORY_CARE_PROVIDER_SITE_OTHER): Payer: Medicare Other

## 2023-12-18 DIAGNOSIS — R001 Bradycardia, unspecified: Secondary | ICD-10-CM | POA: Diagnosis not present

## 2023-12-18 DIAGNOSIS — I4821 Permanent atrial fibrillation: Secondary | ICD-10-CM

## 2023-12-18 LAB — CUP PACEART REMOTE DEVICE CHECK
Battery Remaining Longevity: 68 mo
Battery Remaining Percentage: 60 %
Battery Voltage: 3.01 V
Brady Statistic AP VP Percent: 16 %
Brady Statistic AP VS Percent: 5.6 %
Brady Statistic AS VP Percent: 2.4 %
Brady Statistic AS VS Percent: 55 %
Brady Statistic RA Percent Paced: 8 %
Brady Statistic RV Percent Paced: 9.3 %
Date Time Interrogation Session: 20241231020026
Implantable Lead Connection Status: 753985
Implantable Lead Connection Status: 753985
Implantable Lead Implant Date: 20090420
Implantable Lead Implant Date: 20090420
Implantable Lead Location: 753859
Implantable Lead Location: 753860
Implantable Pulse Generator Implant Date: 20200708
Lead Channel Impedance Value: 440 Ohm
Lead Channel Impedance Value: 450 Ohm
Lead Channel Pacing Threshold Amplitude: 0.75 V
Lead Channel Pacing Threshold Amplitude: 1 V
Lead Channel Pacing Threshold Pulse Width: 0.4 ms
Lead Channel Pacing Threshold Pulse Width: 0.4 ms
Lead Channel Sensing Intrinsic Amplitude: 1.4 mV
Lead Channel Sensing Intrinsic Amplitude: 5.7 mV
Lead Channel Setting Pacing Amplitude: 2 V
Lead Channel Setting Pacing Amplitude: 2.5 V
Lead Channel Setting Pacing Pulse Width: 0.4 ms
Lead Channel Setting Sensing Sensitivity: 2 mV
Pulse Gen Model: 2272
Pulse Gen Serial Number: 9149180

## 2023-12-20 ENCOUNTER — Other Ambulatory Visit: Payer: Self-pay | Admitting: Gastroenterology

## 2023-12-20 DIAGNOSIS — K7460 Unspecified cirrhosis of liver: Secondary | ICD-10-CM

## 2023-12-27 ENCOUNTER — Ambulatory Visit
Admission: RE | Admit: 2023-12-27 | Discharge: 2023-12-27 | Disposition: A | Discharge status: Home / Self Care | Payer: Medicare Other | Source: Ambulatory Visit | Attending: Gastroenterology | Admitting: Gastroenterology

## 2023-12-27 DIAGNOSIS — K7460 Unspecified cirrhosis of liver: Secondary | ICD-10-CM | POA: Diagnosis not present

## 2024-01-03 ENCOUNTER — Other Ambulatory Visit: Payer: Self-pay

## 2024-01-03 NOTE — Progress Notes (Signed)
Specialty Pharmacy Ongoing Clinical Assessment Note  [name]. is a 83 y.o. male who is being followed by the specialty pharmacy service for RxSp Interstitial Lung Disease   Patient's specialty medication(s) reviewed today: Pirfenidone   Missed doses in the last 4 weeks: 0   Patient/Caregiver did not have any additional questions or concerns.   Therapeutic benefit summary: Unable to assess   Adverse events/side effects summary: No adverse events/side effects   Patient's therapy is appropriate to: Continue    Goals Addressed             This Visit's Progress    Stabilization of disease       Patient is on track. Patient will maintain adherence         Follow up:  6 months  Bobette Mo Specialty Pharmacist

## 2024-01-03 NOTE — Progress Notes (Signed)
Specialty Pharmacy Refill Coordination Note  [name]. is a 83 y.o. male who's wife Larene Beach was contacted today regarding refills of specialty medication(s) Pirfenidone   Patient requested Delivery   Delivery date: 01/15/24   Verified address: [address]   Medication will be filled on 01/14/24.

## 2024-01-14 ENCOUNTER — Other Ambulatory Visit: Payer: Self-pay

## 2024-01-22 ENCOUNTER — Other Ambulatory Visit: Payer: Self-pay | Admitting: Internal Medicine

## 2024-01-22 DIAGNOSIS — I4819 Other persistent atrial fibrillation: Secondary | ICD-10-CM

## 2024-01-29 ENCOUNTER — Other Ambulatory Visit: Payer: Self-pay | Admitting: Neurology

## 2024-01-29 NOTE — Progress Notes (Signed)
Remote pacemaker transmission.

## 2024-01-30 NOTE — Telephone Encounter (Signed)
Pt was Last Seen 10/03/2023 (Dr.Dohmeier) Upcoming Appointment 04/02/2024 (Dr. Vickey Huger)  Modafinil 100mg  Last Filled 07/11/2023 (Dr. Epimenio Foot)  I looked back at your Last Office Visit Note for this patient and I did not see where you wanted the patient to continue on the Modafinil 100mg . From the looks of it, Dr. Epimenio Foot was a work-in doctor that last filled the patients medication.

## 2024-01-31 ENCOUNTER — Other Ambulatory Visit (HOSPITAL_COMMUNITY): Payer: Self-pay

## 2024-02-06 ENCOUNTER — Other Ambulatory Visit: Payer: Self-pay

## 2024-02-11 DIAGNOSIS — C44319 Basal cell carcinoma of skin of other parts of face: Secondary | ICD-10-CM | POA: Diagnosis not present

## 2024-02-11 DIAGNOSIS — I8311 Varicose veins of right lower extremity with inflammation: Secondary | ICD-10-CM | POA: Diagnosis not present

## 2024-02-11 DIAGNOSIS — L821 Other seborrheic keratosis: Secondary | ICD-10-CM | POA: Diagnosis not present

## 2024-02-11 DIAGNOSIS — Z85828 Personal history of other malignant neoplasm of skin: Secondary | ICD-10-CM | POA: Diagnosis not present

## 2024-02-11 DIAGNOSIS — D0439 Carcinoma in situ of skin of other parts of face: Secondary | ICD-10-CM | POA: Diagnosis not present

## 2024-02-11 DIAGNOSIS — L570 Actinic keratosis: Secondary | ICD-10-CM | POA: Diagnosis not present

## 2024-02-11 DIAGNOSIS — I872 Venous insufficiency (chronic) (peripheral): Secondary | ICD-10-CM | POA: Diagnosis not present

## 2024-02-11 DIAGNOSIS — D485 Neoplasm of uncertain behavior of skin: Secondary | ICD-10-CM | POA: Diagnosis not present

## 2024-02-11 DIAGNOSIS — I8312 Varicose veins of left lower extremity with inflammation: Secondary | ICD-10-CM | POA: Diagnosis not present

## 2024-02-11 DIAGNOSIS — C44311 Basal cell carcinoma of skin of nose: Secondary | ICD-10-CM | POA: Diagnosis not present

## 2024-02-16 ENCOUNTER — Other Ambulatory Visit (HOSPITAL_COMMUNITY): Payer: Self-pay | Admitting: Family Medicine

## 2024-02-20 DIAGNOSIS — Z85828 Personal history of other malignant neoplasm of skin: Secondary | ICD-10-CM | POA: Diagnosis not present

## 2024-02-20 DIAGNOSIS — C44319 Basal cell carcinoma of skin of other parts of face: Secondary | ICD-10-CM | POA: Diagnosis not present

## 2024-03-17 ENCOUNTER — Encounter: Payer: Self-pay | Admitting: Podiatry

## 2024-03-17 ENCOUNTER — Ambulatory Visit (INDEPENDENT_AMBULATORY_CARE_PROVIDER_SITE_OTHER): Payer: Medicare Other | Admitting: Podiatry

## 2024-03-17 VITALS — Ht 68.0 in | Wt 178.0 lb

## 2024-03-17 DIAGNOSIS — M79674 Pain in right toe(s): Secondary | ICD-10-CM | POA: Diagnosis not present

## 2024-03-17 DIAGNOSIS — I739 Peripheral vascular disease, unspecified: Secondary | ICD-10-CM | POA: Diagnosis not present

## 2024-03-17 DIAGNOSIS — L84 Corns and callosities: Secondary | ICD-10-CM

## 2024-03-17 DIAGNOSIS — M79675 Pain in left toe(s): Secondary | ICD-10-CM

## 2024-03-17 DIAGNOSIS — B351 Tinea unguium: Secondary | ICD-10-CM | POA: Diagnosis not present

## 2024-03-17 DIAGNOSIS — E1149 Type 2 diabetes mellitus with other diabetic neurological complication: Secondary | ICD-10-CM

## 2024-03-17 NOTE — Progress Notes (Signed)
 Subjective: Chief Complaint  Patient presents with   Callouses    " I think I got a lot of things going on with my feet and possibly calloues"    83 year old male presents the office today for evaluation of preulcerative callus on the right foot submetatarsal 1 and for long toenails he is not able to trim himself may cause discomfort like elongated.  No reported recent injuries to his feet.  Objective: AAO x3, NAD- presents with grandson. DP/PT pulses palpable 1/4 b/l, CRT less than 3 seconds Sensation decreased with Semmes Weinstein monofilament. Hyperkeratotic lesion present right foot submetatarsal 1.  There is dried blood present in the callus and upon debridement the area is preulcerative but there is no ulceration identified at this time.  No surrounding erythema, ascending cellulitis.  There is no drainage or pus or signs of infection otherwise. No other open lesions noted.  The nails appear to be hypertrophic, dystrophic to the left 1, 3, 5 in the right 1, 3, 5.  He has tenderness his nails are thickened elongated.  No edema, erythema or signs of infection. No pain with calf compression, swelling, warmth, erythema  Assessment: Preulcerative lesion right foot, symptomatic onychomycosis  Plan: -Sharp debrided the preulcerative callus x 1 on the right foot any complications or bleeding.  Area is preulcerative still needs to monitor closely.  Continue moisturizer, offloading. -Sharply debride the nails x 6 without any complications or bleeding -Daily foot inspection.  Return in about 9 weeks (around 05/19/2024) for routine care.   [name] DPM

## 2024-03-18 ENCOUNTER — Ambulatory Visit (INDEPENDENT_AMBULATORY_CARE_PROVIDER_SITE_OTHER): Payer: Medicare Other

## 2024-03-18 DIAGNOSIS — R001 Bradycardia, unspecified: Secondary | ICD-10-CM

## 2024-03-18 LAB — CUP PACEART REMOTE DEVICE CHECK
Battery Remaining Longevity: 67 mo
Battery Remaining Percentage: 57 %
Battery Voltage: 2.99 V
Brady Statistic AP VP Percent: 15 %
Brady Statistic AP VS Percent: 5.4 %
Brady Statistic AS VP Percent: 2 %
Brady Statistic AS VS Percent: 55 %
Brady Statistic RA Percent Paced: 6.7 %
Brady Statistic RV Percent Paced: 7.7 %
Date Time Interrogation Session: 20250401020029
Implantable Lead Connection Status: 753985
Implantable Lead Connection Status: 753985
Implantable Lead Implant Date: 20090420
Implantable Lead Implant Date: 20090420
Implantable Lead Location: 753859
Implantable Lead Location: 753860
Implantable Pulse Generator Implant Date: 20200708
Lead Channel Impedance Value: 460 Ohm
Lead Channel Impedance Value: 490 Ohm
Lead Channel Pacing Threshold Amplitude: 0.75 V
Lead Channel Pacing Threshold Amplitude: 1 V
Lead Channel Pacing Threshold Pulse Width: 0.4 ms
Lead Channel Pacing Threshold Pulse Width: 0.4 ms
Lead Channel Sensing Intrinsic Amplitude: 1.4 mV
Lead Channel Sensing Intrinsic Amplitude: 8.8 mV
Lead Channel Setting Pacing Amplitude: 2 V
Lead Channel Setting Pacing Amplitude: 2.5 V
Lead Channel Setting Pacing Pulse Width: 0.4 ms
Lead Channel Setting Sensing Sensitivity: 2 mV
Pulse Gen Model: 2272
Pulse Gen Serial Number: 9149180

## 2024-03-21 ENCOUNTER — Encounter: Payer: Self-pay | Admitting: Internal Medicine

## 2024-04-02 ENCOUNTER — Ambulatory Visit: Payer: Medicare Other | Admitting: Neurology

## 2024-04-02 ENCOUNTER — Encounter: Payer: Self-pay | Admitting: Neurology

## 2024-04-02 VITALS — BP 108/68 | HR 142 | Ht 68.0 in | Wt 181.0 lb

## 2024-04-02 DIAGNOSIS — G4733 Obstructive sleep apnea (adult) (pediatric): Secondary | ICD-10-CM

## 2024-04-02 DIAGNOSIS — G4711 Idiopathic hypersomnia with long sleep time: Secondary | ICD-10-CM | POA: Diagnosis not present

## 2024-04-02 DIAGNOSIS — R413 Other amnesia: Secondary | ICD-10-CM | POA: Diagnosis not present

## 2024-04-02 DIAGNOSIS — R41844 Frontal lobe and executive function deficit: Secondary | ICD-10-CM

## 2024-04-02 NOTE — Patient Instructions (Signed)
 Frontotemporal Executive Dysfunction   Frontotemporal / FTD, is the name for a group of diseases that affect the brain. It's sometimes called semantic dementia or Pick's disease. FTD makes certain parts of the brain, called the frontal and temporal lobes, get smaller. These parts of the brain control behavior and speech. FTD gets worse over time. This happens more quickly in some people than others. FTD often starts with changes in how a person acts, talks, and thinks. The main types of FTD are: Behavioral variant FTD. This is the most common type. Primary progressive aphasia. FTD with motor neuron disease. There's no cure for FTD. But treatment and supportive care can improve your quality of life. What are the causes? The exact cause of FTD isn't known. Many gene changes are linked to the disease. In some families, FTD can be linked to a disease called amyotrophic lateral sclerosis (ALS). What increases the risk? FTD is more likely to occur in people who have a family history of the condition. Family members of people with FTD should think about genetic counseling to learn more about their risk. What are the signs or symptoms? Symptoms of FTD often start when a person is 72-5 years of age. Symptoms may include: Changes in behavior, such as: Impulsive actions or doing things without thinking. Not having as much control of how you act. Acting in ways that are embarrassing. Crying or laughing when it's not appropriate. This is called pseudobulbar affect. Starting to use drugs or alcohol. Apathy. This means not having energy or interest in doing things. Not taking care of yourself, like not showering or brushing your teeth. Eating too much or eating when you're not hungry. Speech problems, such as: Not being able to talk as well or use as many words as before. Slurred speech. Forgetting how to write or read. Short-term memory loss is also a symptom later in the disease. FTD is a common  cause of memory loss in people younger than 81 years of age. How is this diagnosed? FTD may be diagnosed based on: Your symptoms and medical history. Your health care provider may suspect FTD if you have behavior or speech problems that are getting worse. You may need to see specialists in brain and behavioral health. A neurological exam to see how well your nervous system is working. Tests, which may include: Blood tests to rule out other causes. A test to check spinal fluid samples for abnormal proteins. This is called a spinal tap or lumbar puncture. Imaging tests, such as a CT scan, a PET scan, or an MRI. Tests to check your thinking and memory. How is this treated? There's no cure for FTD. And treatment can't stop it from getting worse over time. The goal of treatment is to help you deal with symptoms and make sure you get the support you need at home. Treatment may include: Medicines to help manage symptoms like apathy or inappropriate laughing or crying. Sedative medicines to control aggressive or dangerous behavior. Speech and language therapy. Behavioral therapy. Occupational therapy to help with home safety and activity. In time, you may need supportive care at home or in a care facility. Ask about caregiving resources in your area. Follow these instructions at home: Eating and drinking Follow a healthy diet. Eat foods that are high in fiber. These include beans, whole grains, and fresh fruits and vegetables. Try not to have too much sugar and caffeine in your diet. Watch for signs of compulsive eating, which can lead to other  health problems. Do not drink alcohol. Drink enough fluid to keep your pee (urine) pale yellow. Lifestyle Keep a daily routine. Avoid stress and new places. Avoid any activities that may cause aggression. Plan regular physical activity that you enjoy. Have someone there to help you and make sure you are safe. General instructions Take over-the-counter  and prescription medicines only as told by your provider. If you were given a bracelet that tracks where you are, make sure you wear it. Work with your provider to decide on: What things you need help with. How to stay safe. Join a support group for people with FTD. Where to find more information [employer] of Neurological Disorders and Stroke: BasicFM.no The Association for Frontotemporal Degeneration: theaftd.org Contact a health care provider if: Your symptoms change or get worse. Being cared for at home gets harder or more stressful. Get help right away if: It's no longer possible to be cared for at home. You or your family are worried about your safety. You threaten yourself or others. Get help right away if you feel like you may hurt yourself or others, or have thoughts about taking your own life. Go to your nearest emergency room or: Call 911. Call the National Suicide Prevention Lifeline at 301 447 8544 or 988. This is open 24 hours a day. Text the Crisis Text Line at 425-436-8348. This information is not intended to replace advice given to you by your health care provider. Make sure you discuss any questions you have with your health care provider. Document Revised: 02/19/2023 Document Reviewed: 02/19/2023 Elsevier Patient Education  2024 [employer].

## 2024-04-02 NOTE — Progress Notes (Signed)
 Provider:  Neomia Banner, MD  Primary Care Physician:  Imelda Man, MD [address] [place] [address] [place] [address]     Referring Provider: Imelda Man, Md [address] [place],  [place] [address]          Chief Complaint according to patient   Patient presents with:                HISTORY OF PRESENT ILLNESS:  Jamie Brosh. is a 83 y.o. male patient who is here for revisit 04/02/2024 for  hypersomnia but he also has poor sleep habits, his memory testing was normal.      04/02/2024    2:56 PM 10/03/2023    1:38 PM 04/03/2023   11:45 AM 09/13/2022   11:02 AM 03/08/2022   11:49 AM 11/18/2019   10:04 AM 08/14/2019   10:07 AM  MMSE - Mini Mental State Exam  Not completed:      -- --  Orientation to time 5 5 3 4 5 4 2   Orientation to Place 5 5 5 5 5 4 4   Registration 3 3 3 3 3 3 3   Attention/ Calculation 5 5 2 1 5 4 5   Attention/Calculation-comments WORLD  WORLD    WORLD    Recall 2 2 2 2 1 3 2   Language- name 2 objects 2 2 2 2 2 2 2   Language- repeat 1 1 1 1 1 1 1   Language- follow 3 step command 3 3 3 3 3 3 3   Language- read & follow direction 1 1 1 1 1 1 1   Write a sentence 1 1 1 1 1 1 1   Copy design 1 1 0 1 1 1 1   Total score 29 29 23 24 28 27 25       Chief concern according to patient :  patient tends to fall , he has a single prong cane and had a toe amputation. His MMSE was normal but at home he is obstinate and he is frequently sleeping when expected to be awake. He sometimes forgets  his hearing aids.   He doesn't listen to his wife , who reports high risk taking behaviour, like climbing a ladder and  wanting to drive again.  He is equally  un- insightful as he is unwilling to follow medical advise.     03/08/22: [name] is a 83 year old male with a history of obstructive sleep apnea on CPAP.  He returns today for follow-up.  He still struggles with daytime sleepiness.  He reports that he did try modafinil 50 mg daily.    Unsure of the benefit but reports insurance will no longer cover this.   In the past he was seeing Dr. Tilda Fogo for "some memory issues" the were never differentiated.  His wife reports that she has noticed some issues with his memory.  He is able to complete all ADLs independently.  He no longer drives.  He is on Namenda. He is almost deaf    The patient is currently on an auto titration CPAP device set between 6 and 16 cm water pressure with 3 cm expiratory relief.  He is 87% compliant by days but does not manage to use the machine for 4 hours each night.  The residual AHI is only 1.1/h and no central apneas seem to be arising.  The 95th percentile pressure is 8.8 cm water he has very high air leakage.  Family medical history: sister has dementia and mother had severe dementia, onset at 69.   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 No longer driving. No pets.  Tobacco use:  quit after 30 years and used chewing tobacco .  ETOH use ; none , Caffeine intake in form of Coffee( half caff - 1-2 cups ) Soda( /) Tea ( /) , no energy drinks     Sleep habits are as follows: The patient's dinner time is between 7-8 PM. The patient goes to bed at 12-2 AM and continues to sleep for 3 hours, wakes for  bathroom breaks, the first time at 4 AM.  After wards  he forgets to use CPAP again.  The preferred sleep position is supine , with the support of 2 pillows. Dreams are reportedly infrequent/vivid.   The patient wakes up spontaneously. No routine -  8-9  AM is the usual rise time. By 11 he may be back in bed to sleep again.  He reports not feeling refreshed or restored in AM, with symptoms such as dry mouth, no morning headaches, and residual fatigue.  Naps are taken frequently, lasting hours.   Sleeping 10- 12 hours a day.   [id number] Manufacturer: [employer]   Device  identifier: [id number] Device identifier type: GS1  GUDID Information  Request status Successful    Brand name: ASSURITY MRIT Version/Model: DDDR  Company name: [employer] MRI safety info as of 06/25/19: MR Conditional  Contains dry or latex rubber: No    GMDN P.T. name: Dual-chamber implantable pacemaker, rate-responsive    As of 06/25/2019  Status: Implanted     Type Not Specified  Sjcr [address] X[address] Dr [id number] - Explanted   Model/Cat number: [id number] ZEPHYR XL DR Serial number: [id number]  Manufacturer: [employer] M         This MSLT study indicates the presence of sleepiness.  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 This study is not consistent with a diagnosis of narcolepsy.    Review of Systems: Out of a complete 14 system review, the patient complains of only the following symptoms, and all other reviewed systems are negative.:    Excessive daytime sleepiness, getting fragmented sleep throughout the day, goes to be at 3-4 AM playing on the computer and  sleeping in daytime/  Low compliance on CPAP by hours.   Social History   Socioeconomic History   Marital status: Married    Spouse name: Engineer, maintenance (IT)   Number of children: 2   Years of education: Not on file   Highest education level: Not on file  Occupational History   Occupation: Retired- Theatre stage manager  Tobacco Use   Smoking status: Former    Current packs/day: 0.00    Average packs/day: 2.0 packs/day for 20.0 years (40.0 ttl pk-yrs)    Types: Cigarettes    Start date: 02/21/1971    Quit date: 02/21/1991    Years since quitting: 33.1   Smokeless tobacco: Never  Vaping Use   Vaping status: Never Used  Substance and Sexual Activity   Alcohol use: No    Alcohol/week: 0.0 standard drinks of alcohol   Drug use: No   Sexual activity: Not Currently  Other Topics Concern   Not on file  Social History Narrative   Lives with wife   Right handed  Married with two children.      He is a Emergency planning/management officer.     Social Drivers of Corporate investment banker Strain: Not on file  Food Insecurity: Not on file  Transportation Needs: Not on file  Physical Activity: Not on file  Stress: Not on file  Social Connections: Not on file    Family History  Problem Relation Age of Onset   Heart disease Father    Heart attack Father    Heart failure Father    Heart disease Mother    Alzheimer's disease Mother    Dementia Mother     Past Medical History:  Diagnosis Date   (HFpEF) heart failure with preserved ejection fraction (HCC)    a. 05/2013 Echo: EF 55%, mild LVH, diast dysfxn, Ao sclerosis, mildly dil LA, RV dysfxn (poorly visualized), PASP ;  b. 03/2017 Echo: EF 55-60%, no rwma, triv MR, mildly dil RV, mod TR, PASP .   Atrial fibrillation (HCC)    s/p Cox Maze 1/09;  Multaq Rx d/c'd in 2014 due to pulmo fibrosis;  coumadin d/c'd in 2014 due to spontaneous subdural hematoma   BPH (benign prostatic hyperplasia)    CAD (coronary artery disease), native coronary artery    a. s/p CABG 12/2007;  b. Myoview 12/2011: EF 66%, no scar or ischemia; normal.   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   Peripheral neuropathy 07/31/2018   Pulmonary fibrosis (HCC)    Multaq d/c'd 7/14   Subdural hematoma (HCC) 07/2012   spontaneous;  coumadin d/c'd => no longer a candidate for anticoagulation    Past Surgical History:  Procedure Laterality Date   AMPUTATION Left 05/17/2019   Procedure: AMPUTATION LEFT FOURTH TOE;  Surgeon: Jasmine Mesi, MD;  Location: [facility] OR;  Service: Orthopedics;  Laterality: Left;   AMPUTATION TOE Right 02/06/2020   Procedure: AMPUTATION TOE;  Surgeon: Timothy Ford, MD;  Location: [facility] OR;  Service: Orthopedics;  Laterality: Right;   AMPUTATION TOE Right 08/17/2020    Procedure: AMPUTATION TOE 4th toe;  Surgeon: Charity Conch, DPM;  Location: [facility] ORS;  Service: Podiatry;  Laterality: Right;   APPENDECTOMY     CHOLECYSTECTOMY     CORONARY ARTERY BYPASS GRAFT     x3 (left internal mammary artery to distal left anterior descending coronary artery, saphenous vain graft to second circumflex marginal branch, saphenous vain graft to posterior descending coronary artery, endoscopic saphenous vain harvest from right thigh) and modified Cox - Maze IV procedure.  Mendel Stain. [name],MD. Electronically signed CHO/MEDQ D: 01/09/2008/ JOB: [id number] cc:  Aram Beagle MD   CRANIOTOMY  07/30/2012   Procedure: CRANIOTOMY HEMATOMA EVACUATION SUBDURAL;  Surgeon: Ferris Hua, MD;  Location: [facility] NEURO ORS;  Service: Neurosurgery;  Laterality: Right;  Right craniotomy for evacuation of subdural hematoma   FOOT SURGERY     HERNIA REPAIR     INTRAMEDULLARY (IM) NAIL INTERTROCHANTERIC Right 02/04/2020   Procedure: INTRAMEDULLARY (IM) NAIL INTERTROCHANTRIC;  Surgeon: Winston Hawking, MD;  Location: [facility] OR;  Service: Orthopedics;  Laterality: Right;   ORCHIECTOMY     Left  /  testicular cancer   PACEMAKER PLACEMENT     PPM - St. Jude   PPM GENERATOR CHANGEOUT N/A 06/25/2019   Procedure: PPM  GENERATOR CHANGEOUT;  Surgeon: Tammie Fall, MD;  Location: [facility] INVASIVE CV LAB;  Service: Cardiovascular;  Laterality: N/A;   RIGHT HEART CATH N/A 05/22/2023   Procedure: RIGHT HEART CATH;  Surgeon: Darlis Eisenmenger, MD;  Location: [facility] INVASIVE CV LAB;  Service: Cardiovascular;  Laterality: N/A;     Current Outpatient Medications on File Prior to Visit  Medication Sig Dispense Refill   allopurinol (ZYLOPRIM) 300 MG tablet Take 1 tablet (300 mg total) by mouth daily. 30 tablet 0   colchicine 0.6 MG tablet      dapagliflozin propanediol (FARXIGA) 10 MG TABS tablet Take 1 tablet (10 mg total) by mouth daily before breakfast. 30 tablet 11   digoxin (LANOXIN) 0.125 MG tablet TAKE ONE TABLET BY MOUTH MONDAY  THROUGH FRIDAY. DO NOT TAKE ON SATURDAY OR SUNDAY. 90 tablet 2   ezetimibe (ZETIA) 10 MG tablet Take 1 tablet (10 mg total) by mouth daily. 90 tablet 3   ferrous sulfate 325 (65 FE) MG tablet Take 1 tablet (325 mg total) by mouth daily with breakfast. 30 tablet 3   KLOR-CON M20 20 MEQ tablet PLEASE TAKE (1) TABLET TO EQUAL IN THE MORNING AND (1/2) TABLET TO EQUAL IN THE EVENINGS 135 tablet 1   memantine (NAMENDA) 10 MG tablet Take 10 mg by mouth 2 (two) times daily.     metoprolol succinate (TOPROL-XL) 25 MG 24 hr tablet Take 25 mg by mouth daily.     mirabegron ER (MYRBETRIQ) 50 MG TB24 tablet Take 1 tablet (50 mg total) by mouth daily. 30 tablet 3   modafinil (PROVIGIL) 100 MG tablet TAKE 1 TABLET BY MOUTH EVERY DAY 30 tablet 5   Multiple Vitamin (MULTIVITAMIN WITH MINERALS) TABS tablet Take 1 tablet by mouth daily.     NON FORMULARY Pt uses a cpap nightly     pantoprazole (PROTONIX) 40 MG tablet Take 1 tablet (40 mg total) by mouth daily. 30 tablet 0   Pirfenidone 267 MG TABS Take 2 tablets (534 mg total) by mouth 3 (three) times daily with meals. **low dose as maintenance** 180 tablet 5   rosuvastatin (CRESTOR) 40 MG tablet Take 1 tablet (40 mg total) by mouth daily. 90 tablet 2   sertraline (ZOLOFT) 100 MG tablet Take 100 mg by mouth daily.     silver sulfADIAZINE (SILVADENE) 1 % cream Apply 1 Application topically as needed.     spironolactone (ALDACTONE) 25 MG tablet Take 1 tablet (25 mg total) by mouth daily. 90 tablet 3   tamsulosin (FLOMAX) 0.4 MG CAPS capsule Take 1 capsule (0.4 mg total) by mouth at bedtime. 30 capsule 1   Teriparatide, Recombinant, 620 MCG/2.48ML SOPN inject 20 mcg Subcutaneous Once a day 1 mL 11   torsemide (DEMADEX) 20 MG tablet Take 4 tablets (80 mg total) by mouth every morning AND 3 tablets (60 mg total) every evening. 180 tablet 6   No current facility-administered medications on file prior to visit.    Allergies  Allergen Reactions   Aricept  [Donepezil] Other (See Comments)    Hallucination    Baclofen Itching   Lipitor [Atorvastatin] Other (See Comments)    Stiff joints   Nsaids Other (See Comments)    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   Warfarin And Related Other (See Comments)    Stiff joints   Enbrel [Etanercept] Itching     DIAGNOSTIC DATA (LABS, IMAGING, TESTING) - I reviewed patient records, labs, notes, testing and imaging myself where  available.  ATN :  Component Ref Range & Units (hover) 6 mo ago  A -- Beta-amyloid 42/40 Ratio 0.108  Beta-amyloid 42 29.46  Beta-amyloid 40 273.46  T -- p-tau181 2.34 High   N -- NfL, Plasma 5.63    Lab Results  Component Value Date   WBC 4.4 11/13/2023   HGB 12.3 (L) 11/13/2023   HCT 36.7 (L) 11/13/2023   MCV 98.1 11/13/2023   PLT 96 (L) 11/13/2023      Component Value Date/Time   NA 142 11/19/2023 1454   NA 145 (H) 03/29/2023 1203   NA 141 02/23/2017 1237   K 3.8 11/19/2023 1454   K 3.8 02/23/2017 1237   CL 104 11/19/2023 1454   CO2 29 11/19/2023 1454   CO2 27 02/23/2017 1237   GLUCOSE 95 11/19/2023 1454   GLUCOSE 113 02/23/2017 1237   BUN 22 11/19/2023 1454   BUN 31 (H) 03/29/2023 1203   BUN 18.3 02/23/2017 1237   CREATININE 1.63 (H) 11/19/2023 1454   CREATININE 1.74 (H) 11/13/2023 1107   CREATININE 1.2 02/23/2017 1237   CALCIUM 9.5 11/19/2023 1454   CALCIUM 9.6 02/23/2017 1237   PROT 8.2 (H) 11/13/2023 1107   PROT 7.8 03/29/2023 1203   PROT 8.0 02/23/2017 1237   ALBUMIN 4.1 11/13/2023 1107   ALBUMIN 4.0 03/29/2023 1203   ALBUMIN 4.1 02/23/2017 1237   AST 34 11/13/2023 1107   AST 24 02/23/2017 1237   ALT 20 11/13/2023 1107   ALT 15 02/23/2017 1237   ALKPHOS 80 11/13/2023 1107   ALKPHOS 105 02/23/2017 1237   BILITOT 0.6 11/13/2023 1107   BILITOT 0.98 02/23/2017 1237   GFRNONAA 42 (L) 11/19/2023 1454   GFRNONAA 39 (L) 11/13/2023 1107   GFRAA 72 02/01/2021 1410   Lab Results  Component Value Date   CHOL 97 (L) 03/29/2023   HDL 47 03/29/2023    LDLCALC 36 03/29/2023   LDLDIRECT 154.2 10/20/2013   TRIG 65 03/29/2023   CHOLHDL 2.1 03/29/2023   Lab Results  Component Value Date   HGBA1C 5.6 05/21/2019   Lab Results  Component Value Date   VITAMINB12 439 05/17/2023   Lab Results  Component Value Date   TSH 5.573 (H) 06/26/2023    PHYSICAL EXAM:  Today's Vitals   04/02/24 1448  BP: 108/68  Pulse: (!) 142  Weight: 181 lb (82.1 kg)  Height: 5\' 8"  (1.727 m)   Body mass index is 27.52 kg/m.   Wt Readings from Last 3 Encounters:  04/02/24 181 lb (82.1 kg)  03/17/24 178 lb (80.7 kg)  11/23/23 184 lb (83.5 kg)     Ht Readings from Last 3 Encounters:  04/02/24 5\' 8"  (1.727 m)  03/17/24 5\' 8"  (1.727 m)  11/23/23 5\' 8"  (1.727 m)      General: The patient is awake, alert and appears not in acute distress. The patient is well groomed. Head: Normocephalic, atraumatic. Attention span & concentration ability appears restricted  Speech is non-fluent,  without  dysarthria,  with dysphonia , with  aphasia.  Mood and affect are appropriate.   Cranial nerves: no loss of smell or taste reported  Pupils are equal and briskly reactive to light. Funduscopic exam deferred. .  Extraocular movements in vertical and horizontal planes were intact and without nystagmus. No Diplopia. Visual fields by finger perimetry are restricted  Hearing was severely impaired,  with hearing  aid in situ. Facial sensation intact to fine touch.  Facial motor strength is symmetric  and tongue and uvula move midline.  Neck ROM : rotation, tilt and flexion extension were normal for age and shoulder shrug was symmetrical.    Motor exam:  Symmetric bulk, tone and ROM.   right leg is stronger and he puts it first.  Increased diffuse  tone  but without cog -wheeling, good symmetric grip strength .   Sensory:  deferred.    Coordination: The Finger-to-nose maneuver was intact without evidence of ataxia, dysmetria or tremor.   Gait and station: Patient  could rise unassisted from a seated position, walked with a cane for 3 years.    Toe and heel walk were deferred.  Status post toe- amputation.     ASSESSMENT AND PLAN 83 y.o. 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     1) behaviour changes but not clear memory loss- this may be more frontal lobe,  his mother had AD .  He sleeps on and off all day, he seems to not focus on conversations.   2)  EDS _  24/ 24 points.  he continues to use CPAP, but he needs to set a bedtime and t rise time, I like for him to be off screen by 11.30 PM and in bed by midnight.  Up no later than 8 AM.  One or  two power naps allowed.   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 Garrison Kanner, MD.  This can explain some of his excessive  fatigue.    Plan ; MRI brain.  His pacemaker is confirmed to be compatible -  I am looking for another subtype of dementia , more executive dysfunction.  FRONTAL LOBE.   I plan to follow up  through our NP within 8-12  months.   I would like to thank Imelda Man, MD and Imelda Man, Md [address] [place],  [place] [address] for allowing me to meet with and to take care of this pleasant patient.   CC: I will share my notes with Freddy Jain, Dr Reno Cash, Dr Marton Sleeper.   After spending a total time of  34  minutes face to face and additional time for physical and neurologic examination, review of laboratory studies,  personal review of imaging studies, reports and results of other testing and review of referral information / records as far as provided in visit,   Electronically signed by: Neomia Banner, MD 04/02/2024 3:09 PM  [facility] Board certified by The [employer] of Sleep Medicine and Diplomate of the [employer] of Sleep Medicine. Board certified In Neurology through the ABPN, Fellow of the [employer] of Neu[employer].

## 2024-04-03 ENCOUNTER — Telehealth: Payer: Self-pay | Admitting: Neurology

## 2024-04-03 ENCOUNTER — Telehealth: Payer: Self-pay

## 2024-04-03 NOTE — Telephone Encounter (Signed)
 Remote transmission received. Patient was yesterday and today asymptomatic.   Presenting rhythm ~ ST 122 bpm. Shows decrease in AF burden per trends. 1 AF w/ RVR noted on transmission 04/01/24 with peak VR 115 bpm and duration 3 hr 14 min. 2 tachy events (1:1), longest in duration 30 min 18 sec.  Patient has not taken am medication at time of our conversation. Patient advised to take am medication, send manual transmission at 12:00 and I will recheck presenting rhythm. Pt agreeable to plan.

## 2024-04-03 NOTE — Telephone Encounter (Signed)
 Remote transmission received. Presenting rhythm AP/VS w/ PAC's & PVC's. 3 PMT events that appears true PMT. Patient is currently asymptomatic and aware to call if any symptoms arise.

## 2024-04-03 NOTE — Telephone Encounter (Signed)
 No auth required sent to [facility] [phone]

## 2024-04-03 NOTE — Telephone Encounter (Signed)
-----   Message from [facility] sent at 04/03/2024  8:00 AM EDT ----- Needs call as HR was 142 at neurology appointment, may need to send remote transmission and follow up in AF clinic if high burden of AF ----- Message ----- From: Neomia Banner, MD Sent: 04/02/2024   3:38 PM EDT To: Lei Pump, MD; Almeda Jacobs, MD

## 2024-04-11 ENCOUNTER — Encounter (HOSPITAL_COMMUNITY)
Admission: RE | Admit: 2024-04-11 | Discharge: 2024-04-11 | Disposition: A | Discharge status: Home / Self Care | Source: Ambulatory Visit | Attending: Neurology | Admitting: Neurology

## 2024-04-11 DIAGNOSIS — R41844 Frontal lobe and executive function deficit: Secondary | ICD-10-CM | POA: Diagnosis not present

## 2024-04-11 DIAGNOSIS — F0390 Unspecified dementia without behavioral disturbance: Secondary | ICD-10-CM | POA: Diagnosis not present

## 2024-04-11 DIAGNOSIS — Z13858 Encounter for screening for other nervous system disorders: Secondary | ICD-10-CM | POA: Diagnosis not present

## 2024-04-11 LAB — GLUCOSE, CAPILLARY: Glucose-Capillary: 106 mg/dL — ABNORMAL HIGH (ref 70–99)

## 2024-04-11 MED ORDER — FLUDEOXYGLUCOSE F - 18 (FDG) INJECTION
10.0500 | Freq: Once | INTRAVENOUS | Status: AC
  Administered 2024-04-11: 10.05 via INTRAVENOUS

## 2024-04-17 ENCOUNTER — Encounter: Payer: Self-pay | Admitting: Neurology

## 2024-04-21 ENCOUNTER — Encounter: Payer: Self-pay | Admitting: Neurology

## 2024-04-21 DIAGNOSIS — G4733 Obstructive sleep apnea (adult) (pediatric): Secondary | ICD-10-CM

## 2024-04-30 ENCOUNTER — Other Ambulatory Visit: Payer: Self-pay

## 2024-04-30 ENCOUNTER — Other Ambulatory Visit: Payer: Self-pay | Admitting: Pharmacy Technician

## 2024-04-30 NOTE — Progress Notes (Signed)
 Disenrolled; Last filled 01/14/24

## 2024-04-30 NOTE — Progress Notes (Signed)
 Remote pacemaker transmission.

## 2024-05-08 NOTE — Telephone Encounter (Signed)
Order has been placed for the pt.   

## 2024-05-14 DIAGNOSIS — L570 Actinic keratosis: Secondary | ICD-10-CM | POA: Diagnosis not present

## 2024-05-14 DIAGNOSIS — Z85828 Personal history of other malignant neoplasm of skin: Secondary | ICD-10-CM | POA: Diagnosis not present

## 2024-05-21 DIAGNOSIS — M810 Age-related osteoporosis without current pathological fracture: Secondary | ICD-10-CM | POA: Diagnosis not present

## 2024-05-22 ENCOUNTER — Ambulatory Visit (INDEPENDENT_AMBULATORY_CARE_PROVIDER_SITE_OTHER): Admitting: Podiatry

## 2024-05-22 ENCOUNTER — Encounter: Payer: Self-pay | Admitting: Podiatry

## 2024-05-22 DIAGNOSIS — E1149 Type 2 diabetes mellitus with other diabetic neurological complication: Secondary | ICD-10-CM

## 2024-05-22 DIAGNOSIS — L84 Corns and callosities: Secondary | ICD-10-CM

## 2024-05-22 DIAGNOSIS — I739 Peripheral vascular disease, unspecified: Secondary | ICD-10-CM

## 2024-05-22 DIAGNOSIS — L97511 Non-pressure chronic ulcer of other part of right foot limited to breakdown of skin: Secondary | ICD-10-CM | POA: Diagnosis not present

## 2024-05-22 DIAGNOSIS — B351 Tinea unguium: Secondary | ICD-10-CM | POA: Diagnosis not present

## 2024-05-22 MED ORDER — MUPIROCIN 2 % EX OINT: 1.0000 | TOPICAL_OINTMENT | Freq: Two times a day (BID) | CUTANEOUS | 2 refills | Status: AC

## 2024-05-22 NOTE — Patient Instructions (Signed)
 Monitor for any signs/symptoms of infection. Call the office immediately if any occur or go directly to the emergency room. Call with any questions/concerns.

## 2024-05-22 NOTE — Progress Notes (Signed)
 Subjective: Chief Complaint  Patient presents with   [facility]    RM#51 DFC     83 year old male presents the office today for evaluation of preulcerative callus on the right foot submetatarsal 1 and for long toenails he is not able to trim himself.  States he is still following he has with his PCP for this.  Objective: AAO x3, NAD DP/PT pulses palpable 1/4 b/l, CRT less than 3 seconds Sensation decreased with Semmes Weinstein monofilament. Hyperkeratotic lesion present right foot submetatarsal 1.  No underlying ulceration, drainage or signs of infection. The nails appear to be hypertrophic, dystrophic to the left 1, 3, 5 in the right 1, 3, 5.  He has tenderness his nails are thickened elongated.  No edema, erythema or signs of infection. Scab present to dorsal aspect of right hallux as well as a wound on the anterior aspect of the leg.  There is no drainage or pus.  There is no fluctuation or crepitation. No pain with calf compression, swelling, warmth, erythema       Assessment: Preulcerative lesion right foot, symptomatic onychomycosis; ulcerations right lower extremity  Plan: Preulcerative callus -Sharp debrided the preulcerative callus x 1 on the right foot any complications or bleeding.    Symptomatic onychomycosis -Sharply debride the nails x 6 without any complications or bleeding  Ulceration right lower extremity -Prescribed mupirocin  ointment to apply daily.  Clean with soap and water, dry thoroughly and apply bandage.  Elevation. -Monitor for any clinical signs or symptoms of infection and directed to call the office immediately should any occur or go to the ER.  Return in about 4 weeks (around 06/19/2024) for toe ulcer.  Charity Conch DPM

## 2024-06-17 ENCOUNTER — Ambulatory Visit: Payer: Medicare Other

## 2024-06-17 ENCOUNTER — Ambulatory Visit

## 2024-06-17 ENCOUNTER — Ambulatory Visit: Admitting: Podiatry

## 2024-06-17 DIAGNOSIS — R001 Bradycardia, unspecified: Secondary | ICD-10-CM

## 2024-06-17 DIAGNOSIS — L97511 Non-pressure chronic ulcer of other part of right foot limited to breakdown of skin: Secondary | ICD-10-CM

## 2024-06-17 DIAGNOSIS — E1149 Type 2 diabetes mellitus with other diabetic neurological complication: Secondary | ICD-10-CM

## 2024-06-18 LAB — CUP PACEART REMOTE DEVICE CHECK
Battery Remaining Longevity: 63 mo
Battery Remaining Percentage: 55 %
Battery Voltage: 2.99 V
Brady Statistic AP VP Percent: 24 %
Brady Statistic AP VS Percent: 8.5 %
Brady Statistic AS VP Percent: 3.1 %
Brady Statistic AS VS Percent: 45 %
Brady Statistic RA Percent Paced: 13 %
Brady Statistic RV Percent Paced: 14 %
Date Time Interrogation Session: 20250701091650
Implantable Lead Connection Status: 753985
Implantable Lead Connection Status: 753985
Implantable Lead Implant Date: 20090420
Implantable Lead Implant Date: 20090420
Implantable Lead Location: 753859
Implantable Lead Location: 753860
Implantable Pulse Generator Implant Date: 20200708
Lead Channel Impedance Value: 440 Ohm
Lead Channel Impedance Value: 450 Ohm
Lead Channel Pacing Threshold Amplitude: 0.75 V
Lead Channel Pacing Threshold Amplitude: 1 V
Lead Channel Pacing Threshold Pulse Width: 0.4 ms
Lead Channel Pacing Threshold Pulse Width: 0.4 ms
Lead Channel Sensing Intrinsic Amplitude: 1.1 mV
Lead Channel Sensing Intrinsic Amplitude: 6 mV
Lead Channel Setting Pacing Amplitude: 2 V
Lead Channel Setting Pacing Amplitude: 2.5 V
Lead Channel Setting Pacing Pulse Width: 0.4 ms
Lead Channel Setting Sensing Sensitivity: 2 mV
Pulse Gen Model: 2272
Pulse Gen Serial Number: 9149180

## 2024-06-19 NOTE — Progress Notes (Signed)
 Subjective: Chief Complaint  Patient presents with   Foot Ulcer    RM#13 Follow up on right foot ulcer no drainage. Area on bottom of foot and great toe.     83 year old male presents the office today for evaluation ulceration of the right lower extremity.  States he is doing better.  He does not see any drainage or pus.  No new ulcerations identified.  No recent injuries to his feet.  Objective: AAO x3, NAD DP/PT pulses palpable 1/4 b/l, CRT less than 3 seconds Sensation decreased with Semmes Weinstein monofilament. Hyperkeratotic lesion present right foot submetatarsal 1.  No underlying ulceration, drainage or signs of infection. On the anterior aspect of the leg as previously noted this is resolved.  There is still 1 superficial area remaining on the dorsal aspect of the hallux however this is smaller and dry.  There is no drainage or pus.  There is no surrounding erythema, ascending cellulitis.  No fluctuation or crepitation.  No malodor. No pain with calf compression, swelling, warmth, erythema   Assessment: Ulcerations right lower extremity  Plan: - Overall doing much better.  I keep a small amount of antibiotic ointment followed by bandage on the right hallux.  The almost healed at this is not resolved next couple weeks of the nail.  Monitor closely for any signs or symptoms of infection  Return in about 2 months (around 08/18/2024) for routine care; foot ulcers.  [name] DPM

## 2024-06-25 ENCOUNTER — Ambulatory Visit: Payer: Self-pay | Admitting: Internal Medicine

## 2024-07-08 DIAGNOSIS — H353131 Nonexudative age-related macular degeneration, bilateral, early dry stage: Secondary | ICD-10-CM | POA: Diagnosis not present

## 2024-07-08 DIAGNOSIS — H52223 Regular astigmatism, bilateral: Secondary | ICD-10-CM | POA: Diagnosis not present

## 2024-07-08 DIAGNOSIS — H35373 Puckering of macula, bilateral: Secondary | ICD-10-CM | POA: Diagnosis not present

## 2024-07-08 DIAGNOSIS — H5203 Hypermetropia, bilateral: Secondary | ICD-10-CM | POA: Diagnosis not present

## 2024-07-08 DIAGNOSIS — Z961 Presence of intraocular lens: Secondary | ICD-10-CM | POA: Diagnosis not present

## 2024-07-30 DIAGNOSIS — I10 Essential (primary) hypertension: Secondary | ICD-10-CM | POA: Diagnosis not present

## 2024-07-30 DIAGNOSIS — E119 Type 2 diabetes mellitus without complications: Secondary | ICD-10-CM | POA: Diagnosis not present

## 2024-07-31 NOTE — Telephone Encounter (Signed)
 [name]

## 2024-08-06 DIAGNOSIS — Z0000 Encounter for general adult medical examination without abnormal findings: Secondary | ICD-10-CM | POA: Diagnosis not present

## 2024-08-06 DIAGNOSIS — I5032 Chronic diastolic (congestive) heart failure: Secondary | ICD-10-CM | POA: Diagnosis not present

## 2024-08-06 DIAGNOSIS — D696 Thrombocytopenia, unspecified: Secondary | ICD-10-CM | POA: Diagnosis not present

## 2024-08-06 DIAGNOSIS — I10 Essential (primary) hypertension: Secondary | ICD-10-CM | POA: Diagnosis not present

## 2024-08-06 DIAGNOSIS — M109 Gout, unspecified: Secondary | ICD-10-CM | POA: Diagnosis not present

## 2024-08-06 DIAGNOSIS — F331 Major depressive disorder, recurrent, moderate: Secondary | ICD-10-CM | POA: Diagnosis not present

## 2024-08-06 DIAGNOSIS — I2510 Atherosclerotic heart disease of native coronary artery without angina pectoris: Secondary | ICD-10-CM | POA: Diagnosis not present

## 2024-08-06 DIAGNOSIS — E1142 Type 2 diabetes mellitus with diabetic polyneuropathy: Secondary | ICD-10-CM | POA: Diagnosis not present

## 2024-08-06 DIAGNOSIS — J84112 Idiopathic pulmonary fibrosis: Secondary | ICD-10-CM | POA: Diagnosis not present

## 2024-08-06 DIAGNOSIS — I700 Atherosclerosis of aorta: Secondary | ICD-10-CM | POA: Diagnosis not present

## 2024-08-06 DIAGNOSIS — N62 Hypertrophy of breast: Secondary | ICD-10-CM | POA: Diagnosis not present

## 2024-08-06 DIAGNOSIS — L97511 Non-pressure chronic ulcer of other part of right foot limited to breakdown of skin: Secondary | ICD-10-CM | POA: Diagnosis not present

## 2024-08-10 ENCOUNTER — Other Ambulatory Visit (HOSPITAL_COMMUNITY): Payer: Self-pay | Admitting: Cardiology

## 2024-08-10 DIAGNOSIS — I5032 Chronic diastolic (congestive) heart failure: Secondary | ICD-10-CM

## 2024-08-19 ENCOUNTER — Other Ambulatory Visit: Payer: Self-pay | Admitting: Neurology

## 2024-08-21 ENCOUNTER — Ambulatory Visit (INDEPENDENT_AMBULATORY_CARE_PROVIDER_SITE_OTHER): Admitting: Podiatry

## 2024-08-21 ENCOUNTER — Encounter: Payer: Self-pay | Admitting: Podiatry

## 2024-08-21 VITALS — Ht 68.0 in | Wt 181.0 lb

## 2024-08-21 DIAGNOSIS — I739 Peripheral vascular disease, unspecified: Secondary | ICD-10-CM

## 2024-08-21 DIAGNOSIS — E1149 Type 2 diabetes mellitus with other diabetic neurological complication: Secondary | ICD-10-CM | POA: Diagnosis not present

## 2024-08-21 DIAGNOSIS — L84 Corns and callosities: Secondary | ICD-10-CM | POA: Diagnosis not present

## 2024-08-23 ENCOUNTER — Other Ambulatory Visit (HOSPITAL_COMMUNITY): Payer: Self-pay

## 2024-08-23 NOTE — Progress Notes (Signed)
 Subjective: Chief Complaint  Patient presents with   Nail Problem    Pt is here for [facility], and to check on ulcer on the right toe, states no pain at all.    83 year old male presents the office today for evaluation ulceration of the right lower extremity.  States he is doing much better.  He does not report any open lesions to his feet.  He has 1 small superficial area just below the knee on the left side he reports he has been bandaging himself.  Is not seeing any drainage or pus.  Does not report any fevers or chills.    Objective: AAO x3, NAD DP/PT pulses palpable 1/4 b/l, CRT less than 3 seconds Sensation decreased with Semmes Weinstein monofilament. Hyperkeratotic lesion present right foot submetatarsal 1.  Today there is no underlying ulceration, drainage or any signs of infection.  Some of the scabbing present on the right fifth toe.  On the proximal aspect the left leg just below the knee is small superficial area which looks like an abrasion.  No signs of infection today. No pain with calf compression, swelling, warmth, erythema   Assessment: Healing ulcerations  Plan: - Overall doing much better.  Recommend continue offloading.  If the left side is not improved recommend follow-up with his PCP or wound care center.  As a courtesy I debrided the nails with any complications or bleeding as they are quite minimal today.  He needs to check his feet daily and if any changes were to occur to let us  know immediately.   Return in about 3 months (around 11/20/2024).  Donnice JONELLE Fees DPM

## 2024-08-25 ENCOUNTER — Inpatient Hospital Stay: Payer: Medicare Other | Attending: Hematology and Oncology

## 2024-08-25 DIAGNOSIS — D472 Monoclonal gammopathy: Secondary | ICD-10-CM | POA: Diagnosis not present

## 2024-08-25 DIAGNOSIS — D61818 Other pancytopenia: Secondary | ICD-10-CM

## 2024-08-25 DIAGNOSIS — D696 Thrombocytopenia, unspecified: Secondary | ICD-10-CM | POA: Insufficient documentation

## 2024-08-25 DIAGNOSIS — D509 Iron deficiency anemia, unspecified: Secondary | ICD-10-CM

## 2024-08-25 LAB — CBC WITH DIFFERENTIAL/PLATELET
Abs Immature Granulocytes: 0.01 K/uL (ref 0.00–0.07)
Basophils Absolute: 0.1 K/uL (ref 0.0–0.1)
Basophils Relative: 1 %
Eosinophils Absolute: 0.2 K/uL (ref 0.0–0.5)
Eosinophils Relative: 3 %
HCT: 36.4 % — ABNORMAL LOW (ref 39.0–52.0)
Hemoglobin: 12.9 g/dL — ABNORMAL LOW (ref 13.0–17.0)
Immature Granulocytes: 0 %
Lymphocytes Relative: 17 %
Lymphs Abs: 1 K/uL (ref 0.7–4.0)
MCH: 33.1 pg (ref 26.0–34.0)
MCHC: 35.4 g/dL (ref 30.0–36.0)
MCV: 93.3 fL (ref 80.0–100.0)
Monocytes Absolute: 0.2 K/uL (ref 0.1–1.0)
Monocytes Relative: 4 %
Neutro Abs: 4.2 K/uL (ref 1.7–7.7)
Neutrophils Relative %: 75 %
Platelets: 107 K/uL — ABNORMAL LOW (ref 150–400)
RBC: 3.9 MIL/uL — ABNORMAL LOW (ref 4.22–5.81)
RDW: 13.9 % (ref 11.5–15.5)
WBC: 5.6 K/uL (ref 4.0–10.5)
nRBC: 0 % (ref 0.0–0.2)

## 2024-08-25 LAB — COMPREHENSIVE METABOLIC PANEL WITH GFR
ALT: 18 U/L (ref 0–44)
AST: 28 U/L (ref 15–41)
Albumin: 4.5 g/dL (ref 3.5–5.0)
Alkaline Phosphatase: 71 U/L (ref 38–126)
Anion gap: 8 (ref 5–15)
BUN: 38 mg/dL — ABNORMAL HIGH (ref 8–23)
CO2: 33 mmol/L — ABNORMAL HIGH (ref 22–32)
Calcium: 9 mg/dL (ref 8.9–10.3)
Chloride: 103 mmol/L (ref 98–111)
Creatinine, Ser: 1.77 mg/dL — ABNORMAL HIGH (ref 0.61–1.24)
GFR, Estimated: 38 mL/min — ABNORMAL LOW (ref >60)
Glucose, Bld: 108 mg/dL — ABNORMAL HIGH (ref 70–99)
Potassium: 3.7 mmol/L (ref 3.5–5.1)
Sodium: 144 mmol/L (ref 135–145)
Total Bilirubin: 0.7 mg/dL (ref 0.0–1.2)
Total Protein: 8.6 g/dL — ABNORMAL HIGH (ref 6.5–8.1)

## 2024-08-25 LAB — IRON AND IRON BINDING CAPACITY (CC-WL,HP ONLY)
Iron: 77 ug/dL (ref 45–182)
Saturation Ratios: 23 % (ref 17.9–39.5)
TIBC: 329 ug/dL (ref 250–450)
UIBC: 252 ug/dL (ref 117–376)

## 2024-08-25 LAB — FERRITIN: Ferritin: 183 ng/mL (ref 24–336)

## 2024-08-26 ENCOUNTER — Telehealth: Payer: Self-pay

## 2024-08-26 ENCOUNTER — Other Ambulatory Visit: Payer: Self-pay

## 2024-08-26 DIAGNOSIS — Z79899 Other long term (current) drug therapy: Secondary | ICD-10-CM

## 2024-08-26 DIAGNOSIS — J84112 Idiopathic pulmonary fibrosis: Secondary | ICD-10-CM

## 2024-08-26 LAB — KAPPA/LAMBDA LIGHT CHAINS
Kappa free light chain: 102.3 mg/L — ABNORMAL HIGH (ref 3.3–19.4)
Kappa, lambda light chain ratio: 2.83 — ABNORMAL HIGH (ref 0.26–1.65)
Lambda free light chains: 36.2 mg/L — ABNORMAL HIGH (ref 5.7–26.3)

## 2024-08-26 NOTE — Telephone Encounter (Signed)
 Received message from [facility] -   Good morning! Patient has been off Pirfenidone  since January/February. And as he had not been returning our calls for refills he was disenrolled. Daughter called over the weekend for refill. If it is appropriate can you please have [place] re-enroll and he'll need a new initial clinical. Daughter's number is [phone]. If not appropriate until next appt, please inform daughter.   ATC daughter, LVM with contact number to return call.   Called primary number and reached patient. He reports he has been taking pirfenidone  without significant gaps or extended periods of missed doses. He admits missed doses but is unable to confirm how many doses he misses. Fill history indicates pirfenidone  last filled Jan 2025 for 30 days supply. Unclear cause of discrepancy.   He denies side effects. Able to verbalize how he takes the medication, consistent with prescription label instructions. He reports daughter helps with medications and he uses a pillbox to organize medications. Reviewed current medications - appears he was reading a medication list.   Will await call back from daughter to confirm medication use.   Aleck Puls, PharmD, BCPS Clinical Pharmacist  [facility]

## 2024-08-26 NOTE — Progress Notes (Signed)
 Patient daughter called [facility] over the weekend and sw Heather about Pirfenidone  and possible refill. Myles Fitch who tried to reach her today before refilling/re-enrolling and she had to LVM. Have daughter call Fitch directly if she reaches back out.

## 2024-08-27 ENCOUNTER — Telehealth (HOSPITAL_BASED_OUTPATIENT_CLINIC_OR_DEPARTMENT_OTHER): Payer: Self-pay

## 2024-08-27 LAB — MULTIPLE MYELOMA PANEL, SERUM
Albumin SerPl Elph-Mcnc: 4 g/dL (ref 2.9–4.4)
Albumin/Glob SerPl: 1 (ref 0.7–1.7)
Alpha 1: 0.3 g/dL (ref 0.0–0.4)
Alpha2 Glob SerPl Elph-Mcnc: 0.8 g/dL (ref 0.4–1.0)
B-Globulin SerPl Elph-Mcnc: 1.5 g/dL — ABNORMAL HIGH (ref 0.7–1.3)
Gamma Glob SerPl Elph-Mcnc: 1.7 g/dL (ref 0.4–1.8)
Globulin, Total: 4.2 g/dL — ABNORMAL HIGH (ref 2.2–3.9)
IgA: 944 mg/dL — ABNORMAL HIGH (ref 61–437)
IgG (Immunoglobin G), Serum: 1817 mg/dL — ABNORMAL HIGH (ref 603–1613)
IgM (Immunoglobulin M), Srm: 98 mg/dL (ref 15–143)
M Protein SerPl Elph-Mcnc: 0.5 g/dL — ABNORMAL HIGH
Total Protein ELP: 8.2 g/dL (ref 6.0–8.5)

## 2024-08-27 NOTE — Telephone Encounter (Signed)
 Copied from CRM [id number]. Topic: Clinical - Prescription Issue >> Aug 26, 2024 12:26 PM [name] wrote: Reason for CRM:   Pt's daughter [name] is returning call from [place]. Reviewed chart and advised was reaching out about refill request for Pirfenidone . Advised due to not returning calls for refills, pt was disenrolled. He would be re enrolled in grant program and would need another initial consult visit. Pt is already scheduled for 12/10 w/Ramaswamy; however, this is soonest avail for provider.   [name] is requesting if he could receive a bridge refill to cover him until his visit, or if he will need to wait until the visit.   Requested call back  CB#  [phone]

## 2024-08-27 NOTE — Telephone Encounter (Signed)
 Spoke with daughter, Glade. Discussed discrepancy in pirfenidone  fill history - last filled Jan 2025 with 30 day supply, but per patient and daughter, Mr. Stemmler had some tablets remaining last week. Now he is out of pirfenidone .   Glade is now involved in medication management. Previously, her mom helped Mr. Mcgeachy with medications. Glade is unable to confirm how pirfenidone  was used or how many doses have been missed since last fill. Lkely taking differently than prescribed.   Last OV with Dr. 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   Counseled daughter on purpose, proper use, and potential adverse effects including nausea, GERD, and suns sensitivity/rash. Stressed the importance of routine lab monitoring. Will monitor LFT's every month for the first 6 months of treatment then every 3 months. Will monitor CBC every 3 months.  Given uncertainty regarding medication use, will plan to restart titration.  Starting dose will be Esbriet  267 mg 1 tablet three times daily for 7 days, then 2 tablets three times daily thereafter. This will be the maintenance dose. Stressed the importance of taking with meals to minimize stomach upset.    LFTs wnl Sept 2025. Repeat LFTs in October (around the week of October 6); daughter plans to take Mr. Kivett to [facility] for these labs. Order entered. Follow-up with Dr. Geronimo as planned in December 2025.  She is aware that a benefits investigation will need to be completed and to look out for call from [place] or [place] regarding shipment coordination and onboarding.   Aleck Puls, PharmD, BCPS Clinical Pharmacist  [facility]

## 2024-08-27 NOTE — Telephone Encounter (Signed)
 Returned call to patient - see update in separate thread.   Aleck Puls, PharmD, BCPS Clinical Pharmacist  [facility]

## 2024-08-28 ENCOUNTER — Telehealth: Payer: Self-pay

## 2024-08-28 DIAGNOSIS — J84112 Idiopathic pulmonary fibrosis: Secondary | ICD-10-CM

## 2024-08-28 DIAGNOSIS — Z79899 Other long term (current) drug therapy: Secondary | ICD-10-CM

## 2024-08-28 NOTE — Telephone Encounter (Signed)
 Will restart Biv in new prior auth encounter

## 2024-08-28 NOTE — Telephone Encounter (Signed)
 Received notification from patient call that patient is restarting pirfenidone . Submitted a Prior Authorization request to [employer] for PIRFENIDONE  via CoverMyMeds. Will update once we receive a response.  Key: [id number]

## 2024-08-29 ENCOUNTER — Other Ambulatory Visit (HOSPITAL_COMMUNITY): Payer: Self-pay

## 2024-08-29 NOTE — Telephone Encounter (Signed)
 Received notification from [employer] regarding a prior authorization for PIRFENIDONE . Authorization has been APPROVED from 08/28/24 to 12/17/24. Approval letter sent to scan center.  Per test claim, copay for 30 days supply is $0  Patient can continue to fill through [facility]: [phone]   Authorization # [id number] Phone # [phone]

## 2024-09-01 MED ORDER — PIRFENIDONE 267 MG PO TABS
534.0000 mg | ORAL_TABLET | Freq: Three times a day (TID) | ORAL | 1 refills | Status: DC
  Filled 2024-09-02 – 2024-10-13 (×3): qty 180, 30d supply, fill #0
  Filled 2024-11-06 – 2024-11-28 (×2): qty 180, 30d supply, fill #1

## 2024-09-01 MED ORDER — PIRFENIDONE 267 MG PO TABS
ORAL_TABLET | ORAL | 0 refills | Status: DC
  Filled 2024-09-02: qty 159, 30d supply, fill #0

## 2024-09-01 NOTE — Telephone Encounter (Signed)
 [name], daughter, who is helping manage medications to discuss. Informed of $0 copay. Reviewed pirfenidone  counseling (see full details in 08/26/24 Medication Management (Esbriet )) note. She requests the pharmacy team call her to help coordinate shipments as she and her brother are coordinating medication management for [name]. She is aware that she will receive call from [place] or [place] for onboarding.    Aleck Puls, PharmD, BCPS Clinical Pharmacist  [facility]

## 2024-09-02 ENCOUNTER — Other Ambulatory Visit: Payer: Self-pay

## 2024-09-02 ENCOUNTER — Telehealth: Payer: Self-pay | Admitting: *Deleted

## 2024-09-02 NOTE — Telephone Encounter (Signed)
 [name] BIR[facility], [facility]    08/29/24  8:48 AM Note Received notification from [facility] regarding a prior authorization for PIRFENIDONE . Authorization has been APPROVED from 08/28/24 to 12/17/24. Approval letter sent to scan center.   Per test claim, copay for 30 days supply is $0   Patient can continue to fill through [facility]: [phone]    Authorization # [id number] Phone # [phone]     Spoke with Richerd SQUIBB Quantity limit review 180 per 30 days Test claim regenerated and approved.

## 2024-09-02 NOTE — Progress Notes (Unsigned)
 Specialty Pharmacy Initial Fill Coordination Note  [name]. is a 83 y.o. male contacted today regarding initial fill of specialty medication(s) Pirfenidone    Patient requested Delivery   Delivery date: 09/04/24   Verified address: [address] [place], [place] [address]   Medication will be filled on 9/17.   Patient is aware of $0 copayment.

## 2024-09-04 ENCOUNTER — Encounter: Payer: Self-pay | Admitting: Hematology and Oncology

## 2024-09-04 ENCOUNTER — Inpatient Hospital Stay (HOSPITAL_BASED_OUTPATIENT_CLINIC_OR_DEPARTMENT_OTHER): Payer: Medicare Other | Admitting: Hematology and Oncology

## 2024-09-04 VITALS — BP 103/55 | HR 60 | Resp 18 | Ht 68.0 in | Wt 173.8 lb

## 2024-09-04 DIAGNOSIS — D696 Thrombocytopenia, unspecified: Secondary | ICD-10-CM

## 2024-09-04 DIAGNOSIS — D472 Monoclonal gammopathy: Secondary | ICD-10-CM | POA: Diagnosis not present

## 2024-09-04 DIAGNOSIS — E1121 Type 2 diabetes mellitus with diabetic nephropathy: Secondary | ICD-10-CM

## 2024-09-04 NOTE — Progress Notes (Signed)
 [facility] OFFICE PROGRESS NOTE  Patient Care Team: Clarice Nottingham, MD as PCP - General Pietro Redell RAMAN, MD as PCP - Cardiology (Cardiology) Pietro Redell RAMAN, MD as Consulting Physician (Cardiology) Gerome Brunet, DO as Referring Physician (Family Medicine)  ASSESSMENT & PLAN:  Assessment & Plan [name] (monoclonal gammopathy of unknown significance) Myeloma panel is stable Light chain studies are elevated due to chronic kidney disease but overall stable Monitor closely in 12 months Diabetic nephropathy associated with type 2 diabetes mellitus (HCC) His chronic renal disease is not related to [name] He will continue medical management Thrombocytopenia (HCC) The most likely cause of his chronic thrombocytopenia is 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 Even though the CT imaging report from September 2017 did not disclose this, I reviewed the imaging study with the patient extensively which clearly showed mild liver enlargement and splenomegaly. Rarely, autoimmune disorder that could cause pulmonary fibrosis can also cause mild thrombocytopenia. He is not symptomatic.  Previous autoimmune screen, hepatitis C screening and serum vitamin B12 were within normal limits Observe only for now His platelet count is stable  No orders of the defined types were placed in this encounter.    INTERVAL HISTORY: he returns for surveillance follow-up for diagnosis of [name], chronic thrombocytopenia and history of iron deficiency anemia in the setting of chronic kidney disease Patient denies recurrent infection or bone pain We reviewed recent CBC, CMP, iron studies and myeloma panel results  PHYSICAL EXAMINATION: ECOG PERFORMANCE STATUS: 0 - Asymptomatic  Vitals:   09/04/24 1402  BP: (!) 103/55  Pulse: 60  Resp: 18  SpO2: 96%

## 2024-09-04 NOTE — Assessment & Plan Note (Addendum)
 Myeloma panel is stable Light chain studies are elevated due to chronic kidney disease but overall stable Monitor closely in 12 months

## 2024-09-04 NOTE — Assessment & Plan Note (Addendum)
 His chronic renal disease is not related to [name] He will continue medical management

## 2024-09-04 NOTE — Assessment & Plan Note (Addendum)
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 Even though the CT imaging report from September 2017 did not disclose this, I reviewed the imaging study with the patient extensively which clearly showed mild liver enlargement and splenomegaly. Rarely, autoimmune disorder that could cause pulmonary fibrosis can also cause mild thrombocytopenia. He is not symptomatic.  Previous autoimmune screen, hepatitis C screening and serum vitamin B12 were within normal limits Observe only for now His platelet count is stable

## 2024-09-05 NOTE — Progress Notes (Signed)
 Patient's daughter, Glade, were counseled on pirfenidone , including how to monitor for side effects, specifically dermatological/photosensitivity side effects and changes in appetite. See encounter 08/26/24.    Patient has dementia. Spouse previously managed his medications. Now it will be Glade and her brother. Glade reports it was likely [name] was not taking pirfenidone  as prescribed in the past; fill history demonstrates the same.   Plan to restart pirfenidone  titration. Dose will be: 267 mg 1 tablet three times daily for 7 days, then 2 tablets (534mg ) three times daily thereafter. According to low dose protocol per Dr. Reeves last OV note.   Repeat LFTs in 4 weeks.  Aleck Puls, PharmD, BCPS Clinical Pharmacist  [facility]

## 2024-09-16 ENCOUNTER — Ambulatory Visit (INDEPENDENT_AMBULATORY_CARE_PROVIDER_SITE_OTHER): Payer: Medicare Other

## 2024-09-16 DIAGNOSIS — I5032 Chronic diastolic (congestive) heart failure: Secondary | ICD-10-CM | POA: Diagnosis not present

## 2024-09-17 NOTE — Progress Notes (Signed)
 Remote PPM Transmission

## 2024-09-18 ENCOUNTER — Telehealth: Payer: Self-pay

## 2024-09-18 ENCOUNTER — Ambulatory Visit: Payer: Self-pay | Admitting: Internal Medicine

## 2024-09-18 LAB — CUP PACEART REMOTE DEVICE CHECK
Battery Remaining Longevity: 62 mo
Battery Remaining Percentage: 52 %
Battery Voltage: 2.99 V
Brady Statistic AP VP Percent: 25 %
Brady Statistic AP VS Percent: 8.4 %
Brady Statistic AS VP Percent: 3.3 %
Brady Statistic AS VS Percent: 43 %
Brady Statistic RA Percent Paced: 11 %
Brady Statistic RV Percent Paced: 13 %
Date Time Interrogation Session: 20250930020013
Implantable Lead Connection Status: 753985
Implantable Lead Connection Status: 753985
Implantable Lead Implant Date: 20090420
Implantable Lead Implant Date: 20090420
Implantable Lead Location: 753859
Implantable Lead Location: 753860
Implantable Pulse Generator Implant Date: 20200708
Lead Channel Impedance Value: 530 Ohm
Lead Channel Impedance Value: 550 Ohm
Lead Channel Pacing Threshold Amplitude: 0.75 V
Lead Channel Pacing Threshold Amplitude: 1 V
Lead Channel Pacing Threshold Pulse Width: 0.4 ms
Lead Channel Pacing Threshold Pulse Width: 0.4 ms
Lead Channel Sensing Intrinsic Amplitude: 1.7 mV
Lead Channel Sensing Intrinsic Amplitude: 8.7 mV
Lead Channel Setting Pacing Amplitude: 2 V
Lead Channel Setting Pacing Amplitude: 2.5 V
Lead Channel Setting Pacing Pulse Width: 0.4 ms
Lead Channel Setting Sensing Sensitivity: 2 mV
Pulse Gen Model: 2272
Pulse Gen Serial Number: 9149180

## 2024-09-18 NOTE — Telephone Encounter (Signed)
 Pacemaker: Scheduled remote reviewed. Normal device function.  Presenting rhythm: AP/VP 3 HVR EGM's, HR's 177-184, 4 min 45 sec-51 min in duration, atrial driven tachycardias, sent to triage for long episodes. 4 AMS EGM's c/w AF, Hx of PAF, OAC contraindicated per PA report, burden 60% since 08/21/2023 Poor ventricular control during atrial events.   Patient needs appointment with Dr. Waddell. Due for routine follow up and poor V control.   Patient states he has noticed some fast heart rates from time to time but denies any dizziness, SOB or other abnormality.  Offered to make him next available appt with Dr. Waddell, says he cannot make the appt right now as he is caring for his wife. Will have scheduling team reach back out to him in near future to set up follow up.   Patient knows if he has any new symptoms as mentioned above that he is to call our office.

## 2024-09-18 NOTE — Telephone Encounter (Signed)
 Daughter called because dad told her someone called from our office but couldn't explain what it it was about. I told her what the note said. She said if someone will call HER tomorrow after 2 she will have his calendar and will make an appt.

## 2024-09-22 NOTE — Progress Notes (Signed)
 Remote PPM Transmission

## 2024-09-23 NOTE — Telephone Encounter (Signed)
 Spoke w/ patient - he is scheduled with Dr. Waddell on 10/23.

## 2024-09-25 ENCOUNTER — Other Ambulatory Visit: Payer: Self-pay

## 2024-09-30 ENCOUNTER — Other Ambulatory Visit: Payer: Self-pay

## 2024-09-30 ENCOUNTER — Other Ambulatory Visit (HOSPITAL_COMMUNITY): Payer: Self-pay

## 2024-09-30 DIAGNOSIS — E7800 Pure hypercholesterolemia, unspecified: Secondary | ICD-10-CM | POA: Diagnosis not present

## 2024-09-30 DIAGNOSIS — I5032 Chronic diastolic (congestive) heart failure: Secondary | ICD-10-CM | POA: Diagnosis not present

## 2024-09-30 DIAGNOSIS — N62 Hypertrophy of breast: Secondary | ICD-10-CM | POA: Diagnosis not present

## 2024-09-30 DIAGNOSIS — Z79899 Other long term (current) drug therapy: Secondary | ICD-10-CM | POA: Diagnosis not present

## 2024-09-30 DIAGNOSIS — Z8673 Personal history of transient ischemic attack (TIA), and cerebral infarction without residual deficits: Secondary | ICD-10-CM | POA: Diagnosis not present

## 2024-10-02 ENCOUNTER — Other Ambulatory Visit (HOSPITAL_COMMUNITY): Payer: Self-pay

## 2024-10-03 ENCOUNTER — Telehealth: Payer: Self-pay

## 2024-10-03 DIAGNOSIS — Z79899 Other long term (current) drug therapy: Secondary | ICD-10-CM

## 2024-10-03 NOTE — Telephone Encounter (Signed)
 Patient is due for 3month LFTs after restarting Esbriet . Called daughter, Glade, who participates in patient's care. She will take him to [facility] next week. Labs released.

## 2024-10-07 ENCOUNTER — Other Ambulatory Visit (HOSPITAL_COMMUNITY): Payer: Self-pay

## 2024-10-09 ENCOUNTER — Ambulatory Visit: Attending: Internal Medicine | Admitting: Internal Medicine

## 2024-10-09 VITALS — BP 100/50 | HR 133 | Ht 68.0 in | Wt 169.0 lb

## 2024-10-09 DIAGNOSIS — I4819 Other persistent atrial fibrillation: Secondary | ICD-10-CM | POA: Insufficient documentation

## 2024-10-09 LAB — CUP PACEART INCLINIC DEVICE CHECK
Battery Remaining Longevity: 63 mo
Battery Voltage: 2.99 V
Brady Statistic RA Percent Paced: 12 %
Brady Statistic RV Percent Paced: 14 %
Date Time Interrogation Session: 20251023145535
Implantable Lead Connection Status: 753985
Implantable Lead Connection Status: 753985
Implantable Lead Implant Date: 20090420
Implantable Lead Implant Date: 20090420
Implantable Lead Location: 753859
Implantable Lead Location: 753860
Implantable Pulse Generator Implant Date: 20200708
Lead Channel Impedance Value: 487.5 Ohm
Lead Channel Impedance Value: 487.5 Ohm
Lead Channel Pacing Threshold Amplitude: 0.75 V
Lead Channel Pacing Threshold Amplitude: 0.75 V
Lead Channel Pacing Threshold Amplitude: 1 V
Lead Channel Pacing Threshold Amplitude: 1 V
Lead Channel Pacing Threshold Pulse Width: 0.4 ms
Lead Channel Pacing Threshold Pulse Width: 0.4 ms
Lead Channel Pacing Threshold Pulse Width: 0.4 ms
Lead Channel Pacing Threshold Pulse Width: 0.4 ms
Lead Channel Sensing Intrinsic Amplitude: 1.9 mV
Lead Channel Sensing Intrinsic Amplitude: 5.9 mV
Lead Channel Setting Pacing Amplitude: 2 V
Lead Channel Setting Pacing Amplitude: 2.5 V
Lead Channel Setting Pacing Pulse Width: 0.4 ms
Lead Channel Setting Sensing Sensitivity: 2 mV
Pulse Gen Model: 2272
Pulse Gen Serial Number: 9149180

## 2024-10-09 MED ORDER — RIVAROXABAN 15 MG PO TABS: 15.0000 mg | ORAL_TABLET | Freq: Every day | ORAL | 3 refills | Status: DC

## 2024-10-09 NOTE — Addendum Note (Signed)
 Addended by: Klinton Candelas N on: 10/09/2024 03:25 PM   Modules accepted: Orders

## 2024-10-09 NOTE — Progress Notes (Signed)
 HPI [name] returns today for followup. He is a pleasant 83 yo man with dementia, HTN, COPD, and PAF. He was in AT when I saw him in December and feeling poorly. He is now in atrial fib with a RVR.  He is back on digoxin  and a beta blocker but his bp tends to run a little low. His dose of digoxin  was increased to 0.125 mg 6 days a week. He still has palpitations. He is having episodes of atrial fib.    Allergies  Allergen Reactions   Aricept  [Donepezil ] Other (See Comments)    Hallucination    Baclofen  Itching   Lipitor [Atorvastatin ] Other (See Comments)    Stiff joints   Nsaids Other (See Comments)    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   Warfarin And Related Other (See Comments)    Stiff joints   Enbrel [Etanercept] Itching     Current Outpatient Medications  Medication Sig Dispense Refill   allopurinol  (ZYLOPRIM ) 300 MG tablet Take 1 tablet (300 mg total) by mouth daily. 30 tablet 0   colchicine 0.6 MG tablet      dapagliflozin  propanediol (FARXIGA ) 10 MG TABS tablet Take 1 tablet (10 mg total) by mouth daily before breakfast. 30 tablet 11   digoxin  (LANOXIN ) 0.125 MG tablet TAKE ONE TABLET BY MOUTH MONDAY THROUGH FRIDAY. DO NOT TAKE ON SATURDAY OR SUNDAY. 90 tablet 2   ezetimibe  (ZETIA ) 10 MG tablet Take 1 tablet (10 mg total) by mouth daily. 90 tablet 3   ferrous sulfate  325 (65 FE) MG tablet Take 1 tablet (325 mg total) by mouth daily with breakfast. 30 tablet 3   KLOR-CON  M20 20 MEQ tablet PLEASE TAKE (1) TABLET TO EQUAL IN THE MORNING AND (1/2) TABLET TO EQUAL IN THE EVENINGS 135 tablet 1   memantine  (NAMENDA ) 10 MG tablet Take 10 mg by mouth 2 (two) times daily.     metoprolol  succinate (TOPROL -XL) 25 MG 24 hr tablet Take 25 mg by mouth daily.     mirabegron  ER (MYRBETRIQ ) 50 MG TB24 tablet Take 1 tablet (50 mg total) by mouth daily. 30 tablet 3   modafinil  (PROVIGIL ) 100 MG tablet Take 1 tablet (100 mg total) by mouth daily. 30 tablet 5   Multiple Vitamin  (MULTIVITAMIN WITH MINERALS) TABS tablet Take 1 tablet by mouth daily.     mupirocin  ointment (BACTROBAN ) 2 % Apply 1 Application topically 2 (two) times daily. 30 g 2   NON FORMULARY Pt uses a cpap nightly     pantoprazole  (PROTONIX ) 40 MG tablet Take 1 tablet (40 mg total) by mouth daily. 30 tablet 0   Pirfenidone  267 MG TABS Take 1 tab (267mg ) by mouth three times daily with a meal for 7 days. THEN, take 2 tabs (534mg ) by mouth three times daily thereafter. 159 tablet 0   Pirfenidone  267 MG TABS Take 2 tablets (534 mg total) by mouth 3 (three) times daily with meals. **low dose as maintenance** 180 tablet 1   rosuvastatin  (CRESTOR ) 40 MG tablet Take 1 tablet (40 mg total) by mouth daily. 90 tablet 2   sertraline  (ZOLOFT ) 100 MG tablet Take 100 mg by mouth daily.     silver  sulfADIAZINE  (SILVADENE ) 1 % cream Apply 1 Application topically as needed.     spironolactone  (ALDACTONE ) 25 MG tablet Take 0.5 tablets (12.5 mg total) by mouth daily. PLEASE SCHEDULE APPOINTMENT FOR MORE REFILLS 45 tablet 3   tamsulosin  (FLOMAX ) 0.4  MG CAPS capsule Take 1 capsule (0.4 mg total) by mouth at bedtime. 30 capsule 1   Teriparatide , Recombinant, 620 MCG/2.48ML SOPN inject 20 mcg Subcutaneous Once a day 1 mL 11   torsemide  (DEMADEX ) 20 MG tablet Take 4 tablets (80 mg total) by mouth every morning AND 3 tablets (60 mg total) every evening. 180 tablet 6   No current facility-administered medications for this visit.     Past Medical History:  Diagnosis Date   (HFpEF) heart failure with preserved ejection fraction (HCC)    a. 05/2013 Echo: EF 55%, mild LVH, diast dysfxn, Ao sclerosis, mildly dil LA, RV dysfxn (poorly visualized), PASP ;  b. 03/2017 Echo: EF 55-60%, no rwma, triv MR, mildly dil RV, mod TR, PASP .   Atrial fibrillation (HCC)    s/p Cox Maze 1/09;  Multaq  Rx d/c'd in 2014 due to pulmo fibrosis;  coumadin  d/c'd in 2014 due to spontaneous subdural hematoma   BPH (benign prostatic  hyperplasia)    CAD (coronary artery disease), native coronary artery    a. s/p CABG 12/2007;  b. Myoview  12/2011: EF 66%, no scar or ischemia; normal.   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 Jude   Peripheral neuropathy 07/31/2018   Pulmonary fibrosis (HCC)    Multaq  d/c'd 7/14   Subdural hematoma (HCC) 07/2012   spontaneous;  coumadin  d/c'd => no longer a candidate for anticoagulation    ROS:   All systems reviewed and negative except as noted in the HPI.   Past Surgical History:  Procedure Laterality Date   AMPUTATION Left 05/17/2019   Procedure: AMPUTATION LEFT FOURTH TOE;  Surgeon: Addie Cordella Hamilton, MD;  Location: [facility] OR;  Service: Orthopedics;  Laterality: Left;   AMPUTATION TOE Right 02/06/2020   Procedure: AMPUTATION TOE;  Surgeon: Harden Jerona GAILS, MD;  Location: [facility] OR;  Service: Orthopedics;  Laterality: Right;   AMPUTATION TOE Right 08/17/2020   Procedure: AMPUTATION TOE 4th toe;  Surgeon: Gershon Donnice SAUNDERS, DPM;  Location: [facility] ORS;  Service: Podiatry;  Laterality: Right;   APPENDECTOMY     CHOLECYSTECTOMY     CORONARY ARTERY BYPASS GRAFT     x3 (left internal mammary artery to distal left anterior descending coronary artery, saphenous vain graft to second circumflex marginal branch, saphenous vain graft to posterior descending coronary artery, endoscopic saphenous vain harvest from right thigh) and modified Cox - Maze IV procedure.  Sudie DEL. [name],MD. Electronically signed CHO/MEDQ D: 01/09/2008/ JOB: [id number] cc:  Norleen SAUNDERS Gent MD   CRANIOTOMY  07/30/2012   Procedure: CRANIOTOMY HEMATOMA EVACUATION SUBDURAL;  Surgeon: Arley SHAUNNA Helling, MD;  Location: [facility] OR[facility];  Service: Neurosurgery;  Laterality: Right;  Right craniotomy for evacuation of subdural hematoma   FOOT SURGERY     HERNIA REPAIR     INTRAMEDULLARY  (IM) NAIL INTERTROCHANTERIC Right 02/04/2020   Procedure: INTRAMEDULLARY (IM) NAIL INTERTROCHANTRIC;  Surgeon: Kay Kemps, MD;  Location: [facility] OR;  Service: Orthopedics;  Laterality: Right;   ORCHIECTOMY     Left  /  testicular cancer   PACEMAKER PLACEMENT     PPM - St. Jude   PPM GENERATOR CHANGEOUT N/A 06/25/2019   Procedure: PPM GENERATOR CHANGEOUT;  Surgeon: Waddell Jamie ORN, MD;  Location: [facility] INVASIVE CV LAB;  Service: Cardiovascular;  Laterality: N/A;   RIGHT HEART CATH N/A 05/22/2023   Procedure: RIGHT HEART CATH;  Surgeon: Rolan Ezra RAMAN, MD;  Location: [facility] INVASIVE CV LAB;  Service: Cardiovascular;  Laterality: N/A;     Family History  Problem Relation Age of Onset   Heart disease Father    Heart attack Father    Heart failure Father    Heart disease Mother    Alzheimer's disease Mother    Dementia Mother      Social History   Socioeconomic History   Marital status: Married    Spouse name: Engineer, maintenance (IT)   Number of children: 2   Years of education: Not on file   Highest education level: Not on file  Occupational History   Occupation: Retired- Theatre stage manager  Tobacco Use   Smoking status: Former    Current packs/day: 0.00    Average packs/day: 2.0 packs/day for 20.0 years (40.0 ttl pk-yrs)    Types: Cigarettes    Start date: 02/21/1971    Quit date: 02/21/1991    Years since quitting: 33.6   Smokeless tobacco: Never  Vaping Use   Vaping status: Never Used  Substance and Sexual Activity   Alcohol use: No    Alcohol/week: 0.0 standard drinks of alcohol   Drug use: No   Sexual activity: Not Currently  Other Topics Concern   Not on file  Social History Narrative   Lives with wife   Right handed    Married with two children.     He is a Emergency planning/management officer.     Social Drivers of Corporate investment banker Strain: Not on file  Food Insecurity: Not on file  Transportation Needs: Not on file  Physical Activity: Not on file  Stress: Not on file  Social Connections: Not on  file  Intimate Partner Violence: Not on file     BP (!) 100/50 (BP Location: Left Arm, Patient Position: Sitting, Cuff Size: Normal)   Pulse (!) 133   Ht 5' 8 (1.727 m)   Wt 169 lb (76.7 kg)   SpO2 95%   BMI 25.70 kg/m   Physical Exam:  Well appearing elderly man, NAD HEENT: Unremarkable Neck:  No JVD, no thyromegally Lymphatics:  No adenopathy Back:  No CVA tenderness Lungs:  Clear with no wheezes HEART:  Regular rate rhythm, no murmurs, no rubs, no clicks Abd:  soft, positive bowel sounds, no organomegally, no rebound, no guarding Ext:  2 plus pulses, no edema, no cyanosis, no clubbing Skin:  No rashes no nodules Neuro:  CN II through XII intact, motor grossly intact  EKG - atrial tachy with RBBB  DEVICE  Normal device function.  See PaceArt for details.   Assess/Plan:  Atrial tachy - he is better. I asked him to continue the beta blocker and increase the digoxin  daily 6 days a week. AV node ablation might be his best option in the future.  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 Atrial fib - he has occasional atrial fib but mostly atrial tachycardia. I would like for him to be on systemic anti-coag but review of his chart demonstrates a subdural hematoma on coumadin . Because of his risk of bleeding we will cancel our plan for xarelto. CAD - he denies anginal symptoms.  Diastolic heart failure - his symptoms are now class 2 from 3. He will continue his current meds.   Jamie Viliami Bracco,MD

## 2024-10-09 NOTE — Patient Instructions (Addendum)
 Medication Instructions:  Your physician has recommended you make the following change in your medication:    Take heart rate and blood pressure and take 2 Toprol  if heart rate is over 100 Lab Work: None ordered.  You may go to any [facility] Location for your lab work:  [facility] - [address] ([facility]) - [address] [phone] [address]  [place] - [address]   [place]  - [address]   [place] - [address] - [address] [employer]  - [address] - [address] ([facility]   If you have labs (blood work) drawn today and your tests are completely normal, you will receive your results only by: [employer] (if you have MyChart)  If you have any lab test that is abnormal or we need to change your treatment, we will call you or send a MyChart message to review the results.  Testing/Procedures: None ordered.  Follow-Up: At [facility], you and your health needs are our priority.  As part of our continuing mission to provide you with exceptional heart care, we have created designated Provider Care Teams.  These Care Teams include your primary Cardiologist (physician) and Advanced Practice Providers (APPs -  Physician Assistants and Nurse Practitioners) who all work together to provide you with the care you need, when you need it.  We recommend signing up for the patient portal called MyChart.  Sign up information is provided on this After Visit Summary.  MyChart is used to connect with patients for Virtual Visits (Telemedicine).  Patients are able to view lab/test results, encounter notes, upcoming appointments, etc.  Non-urgent messages can be sent to your provider as well.   To learn more about what you can do with MyChart, go to [email or web address].    Your next appointment:   1 year(s)  The format for your next appointment:    In Person  Provider:   Donnice Primus, MD or one of the following Advanced Practice Providers on your designated Care Team:   Charlies Arthur, [place] Ozell Jodie Passey, [place] Leotis Barrack, NP  Note: Remote monitoring is used to monitor your Pacemaker/ ICD from home. This monitoring reduces the number of office visits required to check your device to one time per year. It allows us  to keep an eye on the functioning of your device to ensure it is working properly.

## 2024-10-10 DIAGNOSIS — Z79899 Other long term (current) drug therapy: Secondary | ICD-10-CM | POA: Diagnosis not present

## 2024-10-10 DIAGNOSIS — I5032 Chronic diastolic (congestive) heart failure: Secondary | ICD-10-CM | POA: Diagnosis not present

## 2024-10-11 LAB — HEPATIC FUNCTION PANEL
ALT: 19 IU/L (ref 0–44)
AST: 35 IU/L (ref 0–40)
Albumin: 4.3 g/dL (ref 3.7–4.7)
Alkaline Phosphatase: 81 IU/L (ref 48–129)
Bilirubin Total: 0.5 mg/dL (ref 0.0–1.2)
Bilirubin, Direct: 0.24 mg/dL (ref 0.00–0.40)
Total Protein: 7.5 g/dL (ref 6.0–8.5)

## 2024-10-13 ENCOUNTER — Other Ambulatory Visit (HOSPITAL_COMMUNITY): Payer: Self-pay

## 2024-10-13 ENCOUNTER — Other Ambulatory Visit: Payer: Self-pay

## 2024-10-13 NOTE — Progress Notes (Signed)
 Specialty Pharmacy Refill Coordination Note  [name]. is a 83 y.o. male contacted today regarding refills of specialty medication(s) Pirfenidone    Patient requested Delivery   Delivery date: 10/14/24   Verified address: [address] [place], [place] [address]   Medication will be filled on: 10/13/24

## 2024-10-13 NOTE — Telephone Encounter (Signed)
 Spoke to daughter to review lab results - LFTs wnl. Continue pirfenidone . Recheck labs in 1 month. Daughter requests refill of pirfenidone . There is a current Rx for maintenance dose. She requests specialty pharmacy call her [place]) to coordinate refills. Will route to specialty pharmacy team.  Daughter's [facility]) number is [phone].

## 2024-10-13 NOTE — Progress Notes (Signed)
 Clinical Intervention Note  Clinical Intervention Notes: Patient was changed from spironolactone  to eplerenone due to side effects. No DDIs identified with pirfenidone    Clinical Intervention Outcomes: Prevention of an adverse drug event   Advertising Account Planner

## 2024-10-15 NOTE — Progress Notes (Signed)
 Remote pacemaker transmission.

## 2024-10-16 DIAGNOSIS — D472 Monoclonal gammopathy: Secondary | ICD-10-CM | POA: Diagnosis not present

## 2024-10-16 DIAGNOSIS — I129 Hypertensive chronic kidney disease with stage 1 through stage 4 chronic kidney disease, or unspecified chronic kidney disease: Secondary | ICD-10-CM | POA: Diagnosis not present

## 2024-10-16 DIAGNOSIS — E1122 Type 2 diabetes mellitus with diabetic chronic kidney disease: Secondary | ICD-10-CM | POA: Diagnosis not present

## 2024-10-16 DIAGNOSIS — N189 Chronic kidney disease, unspecified: Secondary | ICD-10-CM | POA: Diagnosis not present

## 2024-10-16 DIAGNOSIS — N1831 Chronic kidney disease, stage 3a: Secondary | ICD-10-CM | POA: Diagnosis not present

## 2024-10-16 DIAGNOSIS — D631 Anemia in chronic kidney disease: Secondary | ICD-10-CM | POA: Diagnosis not present

## 2024-10-16 DIAGNOSIS — N400 Enlarged prostate without lower urinary tract symptoms: Secondary | ICD-10-CM | POA: Diagnosis not present

## 2024-10-18 ENCOUNTER — Encounter (HOSPITAL_BASED_OUTPATIENT_CLINIC_OR_DEPARTMENT_OTHER): Payer: Self-pay | Admitting: Emergency Medicine

## 2024-10-18 ENCOUNTER — Emergency Department (HOSPITAL_BASED_OUTPATIENT_CLINIC_OR_DEPARTMENT_OTHER)
Admission: EM | Admit: 2024-10-18 | Discharge: 2024-10-18 | Disposition: A | Discharge status: Home / Self Care | Attending: Emergency Medicine | Admitting: Emergency Medicine

## 2024-10-18 ENCOUNTER — Emergency Department (HOSPITAL_BASED_OUTPATIENT_CLINIC_OR_DEPARTMENT_OTHER)

## 2024-10-18 DIAGNOSIS — Z951 Presence of aortocoronary bypass graft: Secondary | ICD-10-CM | POA: Diagnosis not present

## 2024-10-18 DIAGNOSIS — M47812 Spondylosis without myelopathy or radiculopathy, cervical region: Secondary | ICD-10-CM | POA: Diagnosis not present

## 2024-10-18 DIAGNOSIS — S0083XA Contusion of other part of head, initial encounter: Secondary | ICD-10-CM | POA: Diagnosis not present

## 2024-10-18 DIAGNOSIS — M50822 Other cervical disc disorders at C5-C6 level: Secondary | ICD-10-CM | POA: Diagnosis not present

## 2024-10-18 DIAGNOSIS — I2510 Atherosclerotic heart disease of native coronary artery without angina pectoris: Secondary | ICD-10-CM | POA: Insufficient documentation

## 2024-10-18 DIAGNOSIS — Z950 Presence of cardiac pacemaker: Secondary | ICD-10-CM | POA: Diagnosis not present

## 2024-10-18 DIAGNOSIS — I509 Heart failure, unspecified: Secondary | ICD-10-CM | POA: Insufficient documentation

## 2024-10-18 DIAGNOSIS — Z79899 Other long term (current) drug therapy: Secondary | ICD-10-CM | POA: Insufficient documentation

## 2024-10-18 DIAGNOSIS — S0990XA Unspecified injury of head, initial encounter: Secondary | ICD-10-CM | POA: Diagnosis not present

## 2024-10-18 DIAGNOSIS — M5030 Other cervical disc degeneration, unspecified cervical region: Secondary | ICD-10-CM | POA: Diagnosis not present

## 2024-10-18 DIAGNOSIS — W01198A Fall on same level from slipping, tripping and stumbling with subsequent striking against other object, initial encounter: Secondary | ICD-10-CM | POA: Insufficient documentation

## 2024-10-18 DIAGNOSIS — S199XXA Unspecified injury of neck, initial encounter: Secondary | ICD-10-CM | POA: Diagnosis not present

## 2024-10-18 DIAGNOSIS — R220 Localized swelling, mass and lump, head: Secondary | ICD-10-CM | POA: Diagnosis not present

## 2024-10-18 DIAGNOSIS — S0993XA Unspecified injury of face, initial encounter: Secondary | ICD-10-CM | POA: Diagnosis not present

## 2024-10-18 MED ORDER — HYDROCODONE-ACETAMINOPHEN 5-325 MG PO TABS
1.0000 | ORAL_TABLET | Freq: Once | ORAL | Status: AC
  Administered 2024-10-18: 1 via ORAL
  Filled 2024-10-18: qty 1

## 2024-10-18 NOTE — ED Triage Notes (Signed)
 Fall on Thursday Hit face on ground Denies loc Large hematoma to face localized pain Denies n/v, no visual issues, no neck pain

## 2024-10-18 NOTE — Discharge Instructions (Signed)
 Your CT imaging of the head, face and cervical spine showed no acute traumatic injury.  You sustained a forehead contusion and the blood from your forehead has likely spread periorbitally resulting in your large facial hematoma

## 2024-10-18 NOTE — ED Provider Notes (Signed)
 [place] EMERGENCY DEPARTMENT AT [facility] Provider Note   CSN: [id number] Arrival date & time: 10/18/24  1623     Patient presents with: [place]   [name]. is a 83 y.o. male.    Fall     83 year old male with medical history significant for atrial fibrillation not on anticoagulation, [name], subdural hematoma in 2013, CHF, CAD/CABG, HLD, Saint Jude pacemaker who presents to the emergency department after a fall on Thursday.  The patient presents as a nonlevel trauma.  The patient states that he was getting out of bed and fell forward struck his face on the ground.  He sustained a forehead hematoma on Thursday and did not immediately seek medical care after this.  The large forehead hematoma has since migrated to surrounding his both eyes periorbitally.  He denies any nausea or vomiting, denies any vision loss, denies any other injuries or complaints.  He presents to the emergency department for evaluation at the urging of his daughter.  He denies loss of consciousness, is not on anticoagulation, arrives GCS 15, ABC intact.  Prior to Admission medications   Medication Sig Start Date End Date Taking? Authorizing Provider  allopurinol  (ZYLOPRIM ) 300 MG tablet Take 1 tablet (300 mg total) by mouth daily. 02/24/20   Angiulli, Toribio PARAS, PA-C  colchicine 0.6 MG tablet     [provider]  dapagliflozin  propanediol (FARXIGA ) 10 MG TABS tablet Take 1 tablet (10 mg total) by mouth daily before breakfast. 06/26/23   Rolan Ezra RAMAN, MD  digoxin  (LANOXIN ) 0.125 MG tablet TAKE ONE TABLET BY MOUTH MONDAY THROUGH FRIDAY. DO NOT TAKE ON SATURDAY OR SUNDAY. 01/23/24   Pietro Redell RAMAN, MD  ezetimibe  (ZETIA ) 10 MG tablet Take 1 tablet (10 mg total) by mouth daily. 05/16/22   Pietro Redell RAMAN, MD  ferrous sulfate  325 (65 FE) MG tablet Take 1 tablet (325 mg total) by mouth daily with breakfast. 02/24/20   Angiulli, Toribio PARAS, PA-C  KLOR-CON  M20 20 MEQ tablet PLEASE TAKE (1) TABLET TO  EQUAL IN THE MORNING AND (1/2) TABLET TO EQUAL IN THE EVENINGS 02/18/24   Glena Harlene HERO, FNP  memantine  (NAMENDA ) 10 MG tablet Take 10 mg by mouth 2 (two) times daily. 08/29/20   [provider]  metoprolol  succinate (TOPROL -XL) 25 MG 24 hr tablet Take 25 mg by mouth daily.    [provider]  mirabegron  ER (MYRBETRIQ ) 50 MG TB24 tablet Take 1 tablet (50 mg total) by mouth daily. 02/24/20   Angiulli, Toribio PARAS, PA-C  modafinil  (PROVIGIL ) 100 MG tablet Take 1 tablet (100 mg total) by mouth daily. 08/27/24   Dohmeier, Dedra, MD  Multiple Vitamin (MULTIVITAMIN WITH MINERALS) TABS tablet Take 1 tablet by mouth daily. 02/10/20   Allana Gins, MD  mupirocin  ointment (BACTROBAN ) 2 % Apply 1 Application topically 2 (two) times daily. 05/22/24   Gershon Donnice SAUNDERS, DPM  NON FORMULARY Pt uses a cpap nightly    [provider]  pantoprazole  (PROTONIX ) 40 MG tablet Take 1 tablet (40 mg total) by mouth daily. 02/24/20   Angiulli, Toribio PARAS, PA-C  Pirfenidone  267 MG TABS Take 1 tab (267mg ) by mouth three times daily with a meal for 7 days. THEN, take 2 tabs (534mg ) by mouth three times daily thereafter. 09/01/24   Geronimo Amel, MD  Pirfenidone  267 MG TABS Take 2 tablets (534 mg total) by mouth 3 (three) times daily with meals. **low dose as maintenance** 09/01/24   Ramaswamy,  Dorethia, MD  rosuvastatin  (CRESTOR ) 40 MG tablet Take 1 tablet (40 mg total) by mouth daily. 02/24/20   Angiulli, Toribio PARAS, PA-C  sertraline  (ZOLOFT ) 100 MG tablet Take 100 mg by mouth daily.    [provider]  silver  sulfADIAZINE  (SILVADENE ) 1 % cream Apply 1 Application topically as needed.    [provider]  spironolactone  (ALDACTONE ) 25 MG tablet Take 0.5 tablets (12.5 mg total) by mouth daily. PLEASE SCHEDULE APPOINTMENT FOR MORE REFILLS 08/12/24   Rolan Ezra RAMAN, MD  tamsulosin  (FLOMAX ) 0.4 MG CAPS capsule Take 1 capsule (0.4 mg total) by mouth at bedtime. 02/24/20   Angiulli,  Toribio PARAS, PA-C  Teriparatide , Recombinant, 620 MCG/2.48ML SOPN inject 20 mcg Subcutaneous Once a day 04/11/21     torsemide  (DEMADEX ) 20 MG tablet Take 4 tablets (80 mg total) by mouth every morning AND 3 tablets (60 mg total) every evening. 11/07/23   Rolan Ezra RAMAN, MD    Allergies: Aricept  Toniann ], Baclofen , Lipitor [atorvastatin ], Nsaids, Warfarin and related, and Enbrel [etanercept]    Review of Systems  All other systems reviewed and are negative.   Updated Vital Signs BP 106/72 (BP Location: Right Arm)   Pulse 98   Temp 97.6 F (36.4 C)   Resp 15   SpO2 100%   Physical Exam Vitals and nursing note reviewed.  Constitutional:      Appearance: He is well-developed.     Comments: GCS 15, ABC intact  HENT:     Head: Normocephalic.     Comments: Large right forehead hematoma with periorbital ecchymosis bilaterally, appears old Eyes:     Conjunctiva/sclera: Conjunctivae normal.  Neck:     Comments: No midline tenderness to palpation of the cervical spine. ROM intact. Cardiovascular:     Rate and Rhythm: Normal rate and regular rhythm.     Heart sounds: No murmur heard. Pulmonary:     Effort: Pulmonary effort is normal. No respiratory distress.     Breath sounds: Normal breath sounds.  Chest:     Comments: Chest wall stable and non-tender to AP and lateral compression. Clavicles stable and non-tender to AP compression Abdominal:     Palpations: Abdomen is soft.     Tenderness: There is no abdominal tenderness.     Comments: Pelvis stable to lateral compression.  Musculoskeletal:     Cervical back: Neck supple.     Comments: No midline tenderness to palpation of the thoracic or lumbar spine. Extremities atraumatic with intact ROM.   Skin:    General: Skin is warm and dry.  Neurological:     Mental Status: He is alert.     Comments: CN II-XII grossly intact. Moving all four extremities spontaneously and sensation grossly intact.     (all labs ordered are  listed, but only abnormal results are displayed) Labs Reviewed - No data to display  EKG: None  Radiology: CT Cervical Spine Wo Contrast Result Date: 10/18/2024 EXAM: CT CERVICAL SPINE WITHOUT CONTRAST 10/18/2024 05:13:06 PM TECHNIQUE: CT of the cervical spine was performed without the administration of intravenous contrast. Multiplanar reformatted images are provided for review. Automated exposure control, iterative reconstruction, and/or weight based adjustment of the mA/kV was utilized to reduce the radiation dose to as low as reasonably achievable. COMPARISON: 11/06/2021 CLINICAL HISTORY: Neck trauma (Age >= 65y) FINDINGS: CERVICAL SPINE: BONES AND ALIGNMENT: No acute fracture or traumatic malalignment. Partial fusion across the C5-C6 disc space and facets. 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 SOFT  TISSUES: No prevertebral soft tissue swelling. IMPRESSION: 1. No acute abnormality of the cervical spine. 2. 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 Electronically signed by: Franky Crease MD 10/18/2024 05:27 PM EDT RP Workstation: [id number]   CT Maxillofacial Wo Contrast Result Date: 10/18/2024 EXAM: CT OF THE FACE WITHOUT CONTRAST 10/18/2024 05:13:06 PM TECHNIQUE: CT of the face was performed without the administration of intravenous contrast. Multiplanar reformatted images are provided for review. Automated exposure control, iterative reconstruction, and/or weight based adjustment of the mA/kV was utilized to reduce the radiation dose to as low as reasonably achievable. COMPARISON: None available. CLINICAL HISTORY: Facial trauma, blunt. FINDINGS: FACIAL BONES: No acute facial fracture. No mandibular dislocation. No suspicious bone lesion. ORBITS: Globes are intact. No acute traumatic injury. No inflammatory change. SINUSES AND MASTOIDS: No acute abnormality. SOFT TISSUES: Soft tissue  swelling in the right forehead. IMPRESSION: 1. No acute facial fracture. Electronically signed by: Franky Crease MD 10/18/2024 05:26 PM EDT RP Workstation: [id number]   CT Head Wo Contrast Result Date: 10/18/2024 EXAM: CT HEAD WITHOUT CONTRAST 10/18/2024 05:13:06 PM TECHNIQUE: CT of the head was performed without the administration of intravenous contrast. Automated exposure control, iterative reconstruction, and/or weight based adjustment of the mA/kV was utilized to reduce the radiation dose to as low as reasonably achievable. COMPARISON: 11/06/2021 CLINICAL HISTORY: Head trauma, minor (Age >= 65y). FINDINGS: BRAIN AND VENTRICLES: No acute hemorrhage. Old right basal ganglia lacunar infarct. Mild age-related atrophy. No evidence of acute infarct. No hydrocephalus. No extra-axial collection. No mass effect or midline shift. ORBITS: No acute abnormality. SINUSES: No acute abnormality. SOFT TISSUES AND SKULL: Soft tissue swelling in the right forehead. No skull fracture. IMPRESSION: 1. No acute intracranial abnormality. Electronically signed by: Franky Crease MD 10/18/2024 05:24 PM EDT RP Workstation: [id number]     Procedures   Medications Ordered in the ED - No data to display                                  Medical Decision Making Amount and/or Complexity of Data Reviewed Radiology: ordered.  Risk Prescription drug management.    83 year old male with medical history significant for atrial fibrillation not on anticoagulation, [name], subdural hematoma in 2013, CHF, CAD/CABG, HLD, Saint Jude pacemaker who presents to the emergency department after a fall on Thursday.  The patient presents as a nonlevel trauma.  The patient states that he was getting out of bed and fell forward struck his face on the ground.  He sustained a forehead hematoma on Thursday and did not immediately seek medical care after this.  The large forehead hematoma has since migrated to surrounding his both eyes periorbitally.   He denies any nausea or vomiting, denies any vision loss, denies any other injuries or complaints.  He presents to the emergency department for evaluation at the urging of his daughter.  He denies loss of consciousness, is not on anticoagulation, arrives GCS 15, ABC intact.  On arrival, the patient was vitally stable.  Presenting with a facial hematoma likely spread of forehead hematoma periorbitally.  CT imaging obtained to further evaluate given the patient's age and fall.  The patient states that his tetanus is up-to-date.  CT head, cervical spine and max face:   IMPRESSION:  1. No acute intracranial abnormality.   IMPRESSION:  1. No acute abnormality of the cervical spine.  2. Advanced  multilevel degenerative disc and facet disease with partial fusion  across the C5-C6 disc space and facets.   IMPRESSION:  1. No acute facial fracture.    Patient advised Tylenol  and NSAIDs for pain control, overall reassuring workup at this time, stable for discharge and outpatient follow-up.     Final diagnoses:  None    ED Discharge Orders     None          Jerrol Agent, MD 10/18/24 1858

## 2024-10-27 ENCOUNTER — Ambulatory Visit: Payer: Self-pay | Admitting: Internal Medicine

## 2024-10-30 DIAGNOSIS — Z23 Encounter for immunization: Secondary | ICD-10-CM | POA: Diagnosis not present

## 2024-10-30 DIAGNOSIS — S0990XD Unspecified injury of head, subsequent encounter: Secondary | ICD-10-CM | POA: Diagnosis not present

## 2024-10-30 DIAGNOSIS — R519 Headache, unspecified: Secondary | ICD-10-CM | POA: Diagnosis not present

## 2024-10-30 DIAGNOSIS — Z9181 History of falling: Secondary | ICD-10-CM | POA: Diagnosis not present

## 2024-10-31 ENCOUNTER — Other Ambulatory Visit: Payer: Self-pay | Admitting: Registered Nurse

## 2024-10-31 DIAGNOSIS — R519 Headache, unspecified: Secondary | ICD-10-CM

## 2024-10-31 DIAGNOSIS — S0990XD Unspecified injury of head, subsequent encounter: Secondary | ICD-10-CM

## 2024-11-03 ENCOUNTER — Ambulatory Visit
Admission: RE | Admit: 2024-11-03 | Discharge: 2024-11-03 | Disposition: A | Source: Ambulatory Visit | Attending: Registered Nurse | Admitting: Registered Nurse

## 2024-11-03 ENCOUNTER — Ambulatory Visit
Admission: RE | Admit: 2024-11-03 | Discharge: 2024-11-03 | Disposition: A | Discharge status: Home / Self Care | Source: Ambulatory Visit | Attending: Registered Nurse | Admitting: Registered Nurse

## 2024-11-03 DIAGNOSIS — S0990XD Unspecified injury of head, subsequent encounter: Secondary | ICD-10-CM

## 2024-11-03 DIAGNOSIS — S0003XA Contusion of scalp, initial encounter: Secondary | ICD-10-CM | POA: Diagnosis not present

## 2024-11-03 DIAGNOSIS — Z961 Presence of intraocular lens: Secondary | ICD-10-CM | POA: Diagnosis not present

## 2024-11-03 DIAGNOSIS — R519 Headache, unspecified: Secondary | ICD-10-CM

## 2024-11-06 ENCOUNTER — Other Ambulatory Visit: Payer: Self-pay

## 2024-11-10 ENCOUNTER — Other Ambulatory Visit: Payer: Self-pay

## 2024-11-11 DIAGNOSIS — Z85828 Personal history of other malignant neoplasm of skin: Secondary | ICD-10-CM | POA: Diagnosis not present

## 2024-11-11 DIAGNOSIS — M7981 Nontraumatic hematoma of soft tissue: Secondary | ICD-10-CM | POA: Diagnosis not present

## 2024-11-11 DIAGNOSIS — L905 Scar conditions and fibrosis of skin: Secondary | ICD-10-CM | POA: Diagnosis not present

## 2024-11-12 ENCOUNTER — Other Ambulatory Visit (HOSPITAL_COMMUNITY): Payer: Self-pay

## 2024-11-14 ENCOUNTER — Other Ambulatory Visit (HOSPITAL_COMMUNITY): Payer: Self-pay | Admitting: Cardiology

## 2024-11-20 ENCOUNTER — Ambulatory Visit: Admitting: Podiatry

## 2024-11-20 DIAGNOSIS — E1149 Type 2 diabetes mellitus with other diabetic neurological complication: Secondary | ICD-10-CM | POA: Diagnosis not present

## 2024-11-20 DIAGNOSIS — M79675 Pain in left toe(s): Secondary | ICD-10-CM | POA: Diagnosis not present

## 2024-11-20 DIAGNOSIS — M79674 Pain in right toe(s): Secondary | ICD-10-CM | POA: Diagnosis not present

## 2024-11-20 DIAGNOSIS — B351 Tinea unguium: Secondary | ICD-10-CM | POA: Diagnosis not present

## 2024-11-20 DIAGNOSIS — L84 Corns and callosities: Secondary | ICD-10-CM | POA: Diagnosis not present

## 2024-11-20 DIAGNOSIS — I739 Peripheral vascular disease, unspecified: Secondary | ICD-10-CM | POA: Diagnosis not present

## 2024-11-24 ENCOUNTER — Other Ambulatory Visit: Payer: Self-pay | Admitting: *Deleted

## 2024-11-24 DIAGNOSIS — J849 Interstitial pulmonary disease, unspecified: Secondary | ICD-10-CM

## 2024-11-25 ENCOUNTER — Ambulatory Visit: Admitting: *Deleted

## 2024-11-25 DIAGNOSIS — J849 Interstitial pulmonary disease, unspecified: Secondary | ICD-10-CM

## 2024-11-25 DIAGNOSIS — M810 Age-related osteoporosis without current pathological fracture: Secondary | ICD-10-CM | POA: Diagnosis not present

## 2024-11-25 LAB — PULMONARY FUNCTION TEST
DL/VA % pred: 60 %
DL/VA: 2.38 ml/min/mmHg/L
DLCO cor % pred: 40 %
DLCO cor: 9.05 ml/min/mmHg
DLCO unc % pred: 40 %
DLCO unc: 9.05 ml/min/mmHg
FEF 25-75 Pre: 3 L/s
FEF2575-%Pred-Pre: 181 %
FEV1-%Pred-Pre: 96 %
FEV1-Pre: 2.36 L
FEV1FVC-%Pred-Pre: 119 %
FEV6-%Pred-Pre: 85 %
FEV6-Pre: 2.77 L
FEV6FVC-%Pred-Pre: 108 %
FVC-%Pred-Pre: 79 %
FVC-Pre: 2.77 L
Pre FEV1/FVC ratio: 85 %
Pre FEV6/FVC Ratio: 100 %

## 2024-11-25 NOTE — Progress Notes (Signed)
 Spirometry and diffusion capacity performed today.

## 2024-11-25 NOTE — Patient Instructions (Signed)
 Spirometry and diffusion capacity performed today.

## 2024-11-26 ENCOUNTER — Ambulatory Visit: Admitting: Internal Medicine

## 2024-11-26 ENCOUNTER — Encounter: Payer: Self-pay | Admitting: Internal Medicine

## 2024-11-26 VITALS — BP 116/64 | HR 95 | Temp 97.6°F | Ht 68.5 in | Wt 164.6 lb

## 2024-11-26 DIAGNOSIS — R600 Localized edema: Secondary | ICD-10-CM | POA: Diagnosis not present

## 2024-11-26 DIAGNOSIS — J849 Interstitial pulmonary disease, unspecified: Secondary | ICD-10-CM | POA: Diagnosis not present

## 2024-11-26 DIAGNOSIS — I5189 Other ill-defined heart diseases: Secondary | ICD-10-CM | POA: Diagnosis not present

## 2024-11-26 DIAGNOSIS — G4733 Obstructive sleep apnea (adult) (pediatric): Secondary | ICD-10-CM

## 2024-11-26 DIAGNOSIS — J8410 Pulmonary fibrosis, unspecified: Secondary | ICD-10-CM

## 2024-11-26 DIAGNOSIS — Z5181 Encounter for therapeutic drug level monitoring: Secondary | ICD-10-CM

## 2024-11-26 DIAGNOSIS — I272 Pulmonary hypertension, unspecified: Secondary | ICD-10-CM

## 2024-11-26 LAB — COMPREHENSIVE METABOLIC PANEL WITH GFR
ALT: 15 U/L (ref 0–53)
AST: 29 U/L (ref 0–37)
Albumin: 4 g/dL (ref 3.5–5.2)
Alkaline Phosphatase: 75 U/L (ref 39–117)
BUN: 25 mg/dL — ABNORMAL HIGH (ref 6–23)
CO2: 33 meq/L — ABNORMAL HIGH (ref 19–32)
Calcium: 8.8 mg/dL (ref 8.4–10.5)
Chloride: 98 meq/L (ref 96–112)
Creatinine, Ser: 1.43 mg/dL (ref 0.40–1.50)
GFR: 45.83 mL/min — ABNORMAL LOW (ref >60.00)
Glucose, Bld: 96 mg/dL (ref 70–99)
Potassium: 3.3 meq/L — ABNORMAL LOW (ref 3.5–5.1)
Sodium: 139 meq/L (ref 135–145)
Total Bilirubin: 0.6 mg/dL (ref 0.2–1.2)
Total Protein: 7.5 g/dL (ref 6.0–8.3)

## 2024-11-26 LAB — CBC WITH DIFFERENTIAL/PLATELET
Basophils Absolute: 0 K/uL (ref 0.0–0.1)
Basophils Relative: 0.8 % (ref 0.0–3.0)
Eosinophils Absolute: 0.1 K/uL (ref 0.0–0.7)
Eosinophils Relative: 2.2 % (ref 0.0–5.0)
HCT: 34.9 % — ABNORMAL LOW (ref 39.0–52.0)
Hemoglobin: 11.9 g/dL — ABNORMAL LOW (ref 13.0–17.0)
Lymphocytes Relative: 17.1 % (ref 12.0–46.0)
Lymphs Abs: 0.8 K/uL (ref 0.7–4.0)
MCHC: 34.2 g/dL (ref 30.0–36.0)
MCV: 96.9 fl (ref 78.0–100.0)
Monocytes Absolute: 0.2 K/uL (ref 0.1–1.0)
Monocytes Relative: 4.7 % (ref 3.0–12.0)
Neutro Abs: 3.4 K/uL (ref 1.4–7.7)
Neutrophils Relative %: 75.2 % (ref 43.0–77.0)
Platelets: 98 K/uL — ABNORMAL LOW (ref 150.0–400.0)
RBC: 3.6 Mil/uL — ABNORMAL LOW (ref 4.22–5.81)
RDW: 14.4 % (ref 11.5–15.5)
WBC: 4.5 K/uL (ref 4.0–10.5)

## 2024-11-26 NOTE — Patient Instructions (Addendum)
 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 t hrug spring 2024 0> since then stable on PFT through 11/26/2024\  esbriet  low dose protocol -helping   Plan - check CBC, BMET, LFT 11/26/2024 - continue  pirfenidone  low dose protocol - do not qualify for clinical trials due to memory issues - CT chest HRCT in 4 months   Pedal edema Severe tricuspid regurgitation Right ventricular dysfunction  -  RHC 2024 shows pulmonary venous congenstion related incrased pressures - last echo spring 2024  Plan   = repeat echo spring 2026    Hx of OSA  Plan  - continue CPAP through sleep doctors    Follow-up - Return in the next 16 weeks to see Candis Dandy at [facility] after CT and echo  - exercise hypoxemia test at followu

## 2024-11-26 NOTE — Progress Notes (Addendum)
 OV with Dr Brenna - [name] 2024:   This is an 83 year old gentleman, past medical history of heart failure with preserved ejection fraction, atrial fibrillation, CAD, diabetes, hyperlipidemia, hypertension, [name], OSA, pacemaker placement history of a subdural hematoma.Several years ago he had CT axial imaging of the chest concerning for UIP pattern.  He had pulmonary function tests in 2020.  He had an autoimmune workup in 2014 that was negative.  He had a TLC of 63% in 2014 and a reduced DLCO.  He had progression from 20 14-20 20.  He has not been on any antifibrotic's now presents with shortness of breath with exertion and cough.    OV 03/20/2023  Subjective:  Patient ID: [name]., male , DOB: [birth year] , age 55 y.o. , MRN: [id number] , ADDRESS: [address] [place] [id number] PCP Clarice Nottingham, MD Patient Care Team: Clarice Nottingham, MD as PCP - General Pietro Redell RAMAN, MD as PCP - Cardiology (Cardiology) Pietro Redell RAMAN, MD as Consulting Physician (Cardiology) Gerome Brunet, DO as Referring Physician (Family Medicine)  This Provider for this visit: Treatment Team:  Attending Provider: Geronimo Amel, MD    03/20/2023 -   Chief Complaint  Patient presents with   Consult    Pulm. fibrosis.     HPI [name]. 83 y.o. -   HRCT 3 OV 04/17/2023  Subjective:  Patient ID: [name]., male , DOB: 03-30-1941 , age 64 y.o. , MRN: [id number] , ADDRESS: [address] [place] [id number] PCP Clarice Nottingham, MD Patient Care Team: Clarice Nottingham, MD as PCP - General Pietro Redell RAMAN, MD as PCP - Cardiology (Cardiology) Pietro Redell RAMAN, MD as Consulting Physician (Cardiology) Gerome Brunet, DO as Referring Physician (Family Medicine)  This Provider for this visit: Treatment Team:  Attending Provider: Geronimo Amel, MD    04/17/2023 -   Chief Complaint  Patient presents with   Follow-up    F/up on ILD     HPI Jamie G  Bothun [name]. 83 y.o. -presents for ILD follow-up.  He brought his ILD questionnaire with him.  I made note of this for his 03/20/23 visit above.  Based on his answers, age greater than 20, progression of the CT scan and today on the pulmonary function test as well in the last 4 years setting the diagnosis IPF.  I gave this diagnosis to him and his wife.  His wife is the main historian here and she is an independent historian.  He has mild dementia.  But he does listen and he seems to have capacity to understand what is going on.  Did indicate to the wife serologies negative.  After the last visit I recommended right heart catheterization.  Dr. Vertie office did try to reach him but could not get hold of him.  Subsequent to that he saw the PA and I am unclear as to what the right heart cath plans are.  It appears that more optimization of the left heart disease was done because his BNP was very high.  I also recommended a overnight pulse oximetry on room air while he uses his CPAP [he uses CPAP on room air] but this has not been done.  I have indicated to the wife we will reorder this.   Lab results do show he also has cirrhosis.  I had a detailed discussion with him and his wife about antifibrotic's.  Definitely do not recommend nintedanib is the  first choice.  He is intolerant to anticoagulants and he also has cardiac disease.  Did bring up the option of pirfenidone  but overall in balance he has a lot of fatigue and a lot of side effects and his pulm polypharmacy.  We took a shared decision making that we would pursue right heart catheterization first and try to optimize his heart failure/pulmonary hypertension.  Did discuss inhaled treprostinil as an option to treat his WHO group 3 pulmonary hypertension if it exists.  Therefore we took a shared decision making to proceed with right heart cath.  I have informed his cardiologist again.    HRCT 03/06/23   Narrative & Impression  CLINICAL DATA:   Interstitial lung disease.   EXAM: CT CHEST WITHOUT CONTRAST   TECHNIQUE: Multidetector CT imaging of the chest was performed following the standard protocol without intravenous contrast. High resolution imaging of the lungs, as well as inspiratory and expiratory imaging, was performed.   RADIATION DOSE REDUCTION: This exam was performed according to the departmental dose-optimization program which includes automated exposure control, adjustment of the mA and/or kV according to patient size and/or use of iterative reconstruction technique.   COMPARISON:  08/03/2021.   FINDINGS: Cardiovascular: Atherosclerotic calcification of the aorta and aortic valve. Enlarged pulmonic trunk and heart. No pericardial effusion.   Mediastinum/Nodes: Mediastinal lymph nodes measure up to 10 mm in the high right paratracheal station, as before. Hilar regions are difficult to evaluate without IV contrast. No axillary adenopathy. Esophagus is grossly unremarkable.   Lungs/Pleura: Interval progression in peripheral and basilar predominant subpleural reticulation, ground-glass and traction bronchiectasis/bronchiolectasis. Interstitial thickening/coarsening in the lung bases. No definitive honeycombing. Calcified granulomas. Scattered fissural thickening bilaterally. No pleural fluid. No air trapping.   Upper Abdomen: Liver margin is irregular. Trace perihepatic ascites. Cholecystectomy. Visualized portions of the adrenal glands and right kidney are unremarkable. Hypodense and hyperdense lesions in the left kidney. No specific follow-up necessary. Spleen may be enlarged but is incompletely imaged. Visualized portions of the pancreas, stomach and bowel are grossly unremarkable. Ventral hernia repair. No upper abdominal adenopathy.   Musculoskeletal: Degenerative changes in the spine. No worrisome lytic or sclerotic lesions.   IMPRESSION: 1. Interval progression in fibrotic interstitial lung  disease, detailed above. Patient reportedly has a known diagnosis of idiopathic pulmonary fibrosis, as indicated in the prior CT chest report. 2. Cirrhosis. 3. Trace perihepatic ascites. 4.  Aortic atherosclerosis (ICD10-I70.0). 5. Enlarged pulmonic trunk, indicative of pulmonary arterial hypertension.     Electronically Signed   By: Newell Eke M.D.   On: 03/07/2023 16:17      ECHO 03/13/23   IMPRESSIONS     1. Left ventricular ejection fraction, by estimation, is 55 to 60%. The  left ventricle has normal function. The left ventricle has no regional  wall motion abnormalities. Left ventricular diastolic parameters are  indeterminate. There is the  interventricular septum is flattened in diastole ('D' shaped left  ventricle), consistent with right ventricular volume overload.   2. Right ventricular systolic function is moderately reduced. The right  ventricular size is moderately enlarged. PASP likely underestimated due to  severity of TR.   3. Left atrial size was mildly dilated.   4. Right atrial size was moderately dilated.   5. The mitral valve is grossly normal. Trivial mitral valve  regurgitation.   6. Tricuspid valve regurgitation is severe.   7. The aortic valve is tricuspid. Aortic valve regurgitation is not  visualized. Aortic valve sclerosis/calcification is present, without any  evidence of aortic stenosis.   8. Aortic dilatation noted. There is borderline dilatation of the  ascending aorta, measuring 38 mm.   9. The inferior vena cava is dilated in size with <50% respiratory  variability, suggesting right atrial pressure of 15 mmHg.   Comparison(s): No significant change from prior TTE. There continues to be  severe TR with moderate RV dysfunction and enlargement. PASP likely  underestimated in the setting of severe TR.    OV 07/10/2023  Subjective:  Patient ID: [name]., male , DOB: 06/18/1941 , age 61 y.o. , MRN: [id number] , ADDRESS: [address] [place] [phone] PCP Clarice Nottingham, MD Patient Care Team: Clarice Nottingham, MD as PCP - General Pietro Redell RAMAN, MD as PCP - Cardiology (Cardiology) Pietro Redell RAMAN, MD as Consulting Physician (Cardiology) Gerome Brunet, DO as Referring Physician (Family Medicine)  This Provider for this visit: Treatment Team:  Attending Provider: Geronimo Amel, MD    07/10/2023 -   Chief Complaint  Patient presents with   Follow-up    !2 week f/up no complaints.    HPI Ustin [employer]. 83 y.o. -returns for follow-up.  Presents with his wife.  Wife is independent historian.  He is mostly quiet.  He has a cane.  But he is understanding the conversation.  He had his right heart catheterization.  Results are below.  I personally reviewed it.  He has pulmonary venous hypertension.  I reviewed Medical records indicate they have started increasing his torsemide .  No difference in shortness of breath but his pedal edema is better.  I had pulmonary function test today and I given that is a little stable compared to April 2024 but overall progressive.  Again mentioned IPF and progressive nature of this disease.  Other than the above in the interim Interim Health status: No new complaints No new medical problems. No new surgeries. No ER visits. No Urgent care visits. No changes to medications   We discussed antifibrotic therapy.  Also discussed clinical trials [see below].  At this point in time first-line is to do antifibrotic therapy.  Previously recommended against nintedanib because of cardiac issues and anticoagulation.  Discussed pirfenidone .  Discussed that we will do the low-dose protocol.  I have sent a message to our pharmacy team about this. Pirfenidone  is approved drug in the [place]  since Oct 2014 but approved prior  in [place], [place], [place], and [place] for treatment of IPF.  Is a high risk prescription that requires frequent therapeutic monitoring.  RHC June 2024  1.  Severe  elevation in right heart filling pressures with prominent V-waves in RA pressure tracing suggesting significant TR.  2.  Mild pulmonary hypertension, suspect predominantly pulmonary venous hypertension.  3.  Mildly elevated PCWP.  4.  Mildly decreased cardiac output.  5.  RV dysfunction as evidenced by low PAPi.    Increase torsemide  to 40 mg bid and add KCl 20 daily.  Will arrange CHF clinic followup   OV 10/23/2023  Subjective:  Patient ID: [name]., male , DOB: 09/06/1941 , age 76 y.o. , MRN: [id number] , ADDRESS: [address] [place] [phone] PCP Clarice Nottingham, MD Patient Care Team: Clarice Nottingham, MD as PCP - General Pietro Redell RAMAN, MD as PCP - Cardiology (Cardiology) Pietro Redell RAMAN, MD as Consulting Physician (Cardiology) Gerome Brunet, DO as Referring Physician (Family Medicine)  This Provider for this visit: Treatment Team:  Attending Provider: Geronimo Amel, MD  10/23/2023 -  Chief Complaint  Patient presents with   Follow-up    Pt has not yet completed ono, has been using cpap. Pt denies any concerns      HPI Gaege [employer]. 83 y.o. -returns for follow-up.  Presents with his wife.  He started pirfenidone  low-dose protocol late July 2024/early August 2024.  He is tolerating it well without any side effects. Interim Health status: No new complaintss. No new surgeries. No ER visits. No Urgent care visits. No changes to medications according to the wife.  His last liver function test was in June 2024.  Wife believes he had some blood work 2 weeks ago but I could not locate it.  Is a blood work from a month ago with elevated creatinine.  But they have agreed to have repeat blood work done.  He is known to have anemia.  From a respiratory standpoint he is stable see below.  Excess hypoxemia test also stable.  Pirfenidone  tolerance is fine.  They will have liver function test today.  Offered high-dose flu shot and he agreed.  Last high-resolution CT  chest March 2024.  Key pertinent past medical history - Saw Dr. Fabiene Raring 10/16/2023 for cirrhosis.  I did indicate to him because he is on pirfenidone  and to the wife that liver function test would need to be monitored especially in setting of cirrhosis they do not think the cirrhosis significant.  He did have right upper quadrant ultrasound earlier this year that the visualized and it did report cirrhosis we will check liver function test  -Saw Dr. Dedra Dohmeier 10/03/2023 for memory disturbance.  Mini-Mental score was 29/30 but looks like he failed a [place] test.  They try to get an MRI done.  Based on chart review in the history of the wife.      OV 11/26/2024  Subjective:  Patient ID: [name]., male , DOB: 1941/05/07 , age 79 y.o. , MRN: [id number] , ADDRESS: [address] [place] [phone] PCP Clarice Nottingham, MD Patient Care Team: Clarice Nottingham, MD as PCP - General Pietro Redell RAMAN, MD as PCP - Cardiology (Cardiology) Pietro Redell RAMAN, MD as Consulting Physician (Cardiology) Gerome Brunet, DO as Referring Physician (Family Medicine)  This Provider for this visit: Treatment Team:  Attending Provider: Geronimo Amel, MD   #IPF given diagnosis in April 2024 [progressive phenotype between 2020 and 2024] #Multiple other comorbidities including CKD with a GFR 50 18 June 2023/creatinine 1.4 #Right heart catheterization pulmonary venous hypertension June 2024 #Esbriet /Pirfenidone  requires intensive drug monitoring due to high concerns for Adverse effects of , including  Drug Induced Liver Injury, significant GI side effects that include but not limited to Diarrhea, Nausea, Vomiting,  and other system side effects that include Fatigue, headaches, weight loss and other side effects such as skin rash. These will be monitored with  blood work such as LFT initially once a month for 6 months and then quarterly  - low dose protocol end July 2024/early aug  2024  #Medical problems include  - Memory issues -Sleep apnea on CPAP -CKD -Anemia  11/26/2024 -   Chief Complaint  Patient presents with   Interstitial Lung Disease    Pt states since LOV breathing has been good as long as pt isn't under stress SOB occurs w/ exertion Dry cough occurs sometimes     HPI Kollyn G [employer]. 83 y.o. -Jaxsen G Eulon Allnutt. is an 83 year old male with pulmonary fibrosis who presents with  worsening breathing.Wife and he are historians, He has experienced worsening breathing compared to a year ago. He is currently taking Esbriet , 2 pills three times a day, for his pulmonary fibrosis without any problems. He uses a CPAP machine at night but does not use supplemental oxygen.  Five weeks ago, he fell off the bed, resulting in bruises. He underwent two CT scans at an urgent care facility, which were unremarkable. He did not visit the emergency room following the fall.  His memory is reportedly getting worse, and he sometimes chokes while eating. He has seen a gastroenterologist for swallowing issues.  He received a flu shot recently.   PFT shows stability x nearly 2 years   SYMPTOM SCALE - ILD 04/17/2023 07/10/2023 Wife filled out 10/23/2023 Wife filled out 11/26/2024 Wife filled out  Current weight      O2 use 0     Shortness of Breath 0 -> 5 scale with 5 being worst (score 6 If unable to do)     At rest 0 1 1 0  Simple tasks - showers, clothes change, eating, shaving 3 2 1 4   Household (dishes, doing bed, laundry) 4 2 3 5   Walking level at own pace 3 2 4 5   Walking up Stairs 5 5 0 5  Total (30-36) Dyspnea Score 15 13 9 19       Non-dyspnea symptoms (0-> 5 scale) 04/17/2023 07/10/2023  10/23/2023  11/26/2024 +  How bad is your cough? 2 3 2  2.5  How bad is your fatigue 4 4 3 5   How bad is nausea 1 0 0 2  How bad is vomiting?  1 0 0 2  How bad is diarrhea? 2 0 0 2  How bad is anxiety? 1 1 2 1   How bad is depression 1 1 x 2.5  Any chronic pain -  if so where and how bad 3  x 1      Simple office walk 224 (66+46 x 2) feet Pod A at [facility] x  3 laps goal with forehead probe 10/23/2023    O2 used ra   Number laps completed Sit stad x 10   Comments about pace Used cane   Resting Pulse Ox/HR 98% and 74/min   Final Pulse Ox/HR 93% and 101/min   Desaturated </= 88% no   Desaturated <= 3% points yes   Got Tachycardic >/= 90/min yes   Symptoms at end of test x   Miscellaneous comments x      PFT     Latest Ref Rng & Units 11/25/2024   10:26 AM 07/10/2023   12:41 PM 04/04/2023    3:55 PM 10/28/2019   11:12 AM  PFT Results  FVC-Pre L 2.77  P  2.44  3.05   FVC-Predicted Pre % 79  P 75  69  82   FVC-Post L   2.42  2.96   FVC-Predicted Post %   68  80   Pre FEV1/FVC % % 85  P 85  86  85   Post FEV1/FCV % %   89  88   FEV1-Pre L 2.36  P 2.27  2.10  2.59   FEV1-Predicted Pre % 96  P 90  84  97   FEV1-Post L   2.15  2.60   DLCO uncorrected ml/min/mmHg 9.05  P 7.27  8.67  15.51   DLCO UNC% % 40  P 32  39  68   DLCO corrected ml/min/mmHg 9.05  P 7.86  9.68  19.26   DLCO COR %Predicted % 40  P 35  43  84   DLVA Predicted % 60  P 75  70  107   TLC L   4.14  5.27   TLC % Predicted %   64  81   RV % Predicted %   70  101     P Preliminary result       LAB RESULTS last 96 hours No results found.       has a past medical history of (HFpEF) heart failure with preserved ejection fraction (HCC), Atrial fibrillation (HCC), BPH (benign prostatic hyperplasia), CAD (coronary artery disease), native coronary artery, DM (diabetes mellitus) (HCC), Hyperlipidemia type II, Hypertension, [name] (monoclonal gammopathy of unknown significance) (07/31/2018), OSA (obstructive sleep apnea), Pacemaker, Peripheral neuropathy (07/31/2018), Pulmonary fibrosis (HCC), and Subdural hematoma (HCC) (07/2012).   reports that he quit smoking about 33 years ago. His smoking use included cigarettes. He started smoking about 53 years ago. He has a 40 pack-year  smoking history. He has never used smokeless tobacco.  Past Surgical History:  Procedure Laterality Date   AMPUTATION Left 05/17/2019   Procedure: AMPUTATION LEFT FOURTH TOE;  Surgeon: Addie Cordella Hamilton, MD;  Location: [facility] OR;  Service: Orthopedics;  Laterality: Left;   AMPUTATION TOE Right 02/06/2020   Procedure: AMPUTATION TOE;  Surgeon: Harden Jerona GAILS, MD;  Location: [facility] OR;  Service: Orthopedics;  Laterality: Right;   AMPUTATION TOE Right 08/17/2020   Procedure: AMPUTATION TOE 4th toe;  Surgeon: Gershon Donnice SAUNDERS, DPM;  Location: [facility] ORS;  Service: Podiatry;  Laterality: Right;   APPENDECTOMY     CHOLECYSTECTOMY     CORONARY ARTERY BYPASS GRAFT     x3 (left internal mammary artery to distal left anterior descending coronary artery, saphenous vain graft to second circumflex marginal branch, saphenous vain graft to posterior descending coronary artery, endoscopic saphenous vain harvest from right thigh) and modified Cox - Maze IV procedure.  Sudie DEL. [name],MD. Electronically signed CHO/MEDQ D: 01/09/2008/ JOB: [id number] cc:  Norleen SAUNDERS Gent MD   CRANIOTOMY  07/30/2012   Procedure: CRANIOTOMY HEMATOMA EVACUATION SUBDURAL;  Surgeon: Arley SHAUNNA Helling, MD;  Location: [facility] NEURO ORS;  Service: Neurosurgery;  Laterality: Right;  Right craniotomy for evacuation of subdural hematoma   FOOT SURGERY     HERNIA REPAIR     INTRAMEDULLARY (IM) NAIL INTERTROCHANTERIC Right 02/04/2020   Procedure: INTRAMEDULLARY (IM) NAIL INTERTROCHANTRIC;  Surgeon: Kay Kemps, MD;  Location: [facility] OR;  Service: Orthopedics;  Laterality: Right;   ORCHIECTOMY     Left  /  testicular cancer   PACEMAKER PLACEMENT     PPM - [employer]   PPM GENERATOR CHANGEOUT N/A 06/25/2019   Procedure: PPM GENERATOR CHANGEOUT;  Surgeon: Waddell Danelle ORN, MD;  Location: [facility] INVASIVE CV LAB;  Service: Cardiovascular;  Laterality: N/A;   RIGHT HEART CATH N/A 05/22/2023   Procedure: RIGHT HEART CATH;  Surgeon: Rolan Ezra RAMAN, MD;  Location[facility] INVASIVE CV LAB;   Service: Cardiovascular;  Laterality: N/A;    Allergies  Allergen Reactions   Aricept  [Donepezil ] Other (See Comments)    Hallucination    Baclofen  Itching   Lipitor [Atorvastatin ] Other (See Comments)    Stiff joints   Nsaids Other (See Comments)    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   Warfarin And Related Other (See Comments)    Stiff joints   Enbrel [Etanercept] Itching    Immunization History  Administered  Date(s) Administered   Fluad Quad(high Dose 65+) 08/18/2019, 11/08/2021, 10/20/2024   INFLUENZA, HIGH DOSE SEASONAL PF 09/17/2018   PFIZER(Purple Top)SARS-COV-2 Vaccination 06/21/2020, 07/12/2020   Tdap 08/14/2014, 07/06/2019   Zoster Recombinant(Shingrix) 06/30/2019    Family History  Problem Relation Age of Onset   Heart disease Father    Heart attack Father    Heart failure Father    Heart disease Mother    Alzheimer's disease Mother    Dementia Mother      Current Outpatient Medications:    allopurinol  (ZYLOPRIM ) 300 MG tablet, Take 1 tablet (300 mg total) by mouth daily., Disp: 30 tablet, Rfl: 0   colchicine 0.6 MG tablet, , Disp: , Rfl:    dapagliflozin  propanediol (FARXIGA ) 10 MG TABS tablet, Take 1 tablet (10 mg total) by mouth daily before breakfast., Disp: 30 tablet, Rfl: 11   digoxin  (LANOXIN ) 0.125 MG tablet, TAKE ONE TABLET BY MOUTH MONDAY THROUGH FRIDAY. DO NOT TAKE ON SATURDAY OR SUNDAY., Disp: 90 tablet, Rfl: 2   ezetimibe  (ZETIA ) 10 MG tablet, Take 1 tablet (10 mg total) by mouth daily., Disp: 90 tablet, Rfl: 3   ferrous sulfate  325 (65 FE) MG tablet, Take 1 tablet (325 mg total) by mouth daily with breakfast., Disp: 30 tablet, Rfl: 3   memantine  (NAMENDA ) 10 MG tablet, Take 10 mg by mouth 2 (two) times daily., Disp: , Rfl:    metoprolol  succinate (TOPROL -XL) 25 MG 24 hr tablet, Take 25 mg by mouth daily., Disp: , Rfl:    mirabegron  ER (MYRBETRIQ ) 50 MG TB24 tablet, Take 1 tablet (50 mg total) by mouth daily., Disp: 30 tablet, Rfl: 3   modafinil   (PROVIGIL ) 100 MG tablet, Take 1 tablet (100 mg total) by mouth daily., Disp: 30 tablet, Rfl: 5   Multiple Vitamin (MULTIVITAMIN WITH MINERALS) TABS tablet, Take 1 tablet by mouth daily., Disp:  , Rfl:    NON FORMULARY, Pt uses a cpap nightly, Disp: , Rfl:    pantoprazole  (PROTONIX ) 40 MG tablet, Take 1 tablet (40 mg total) by mouth daily., Disp: 30 tablet, Rfl: 0   Pirfenidone  267 MG TABS, Take 1 tab (267mg ) by mouth three times daily with a meal for 7 days. THEN, take 2 tabs (534mg ) by mouth three times daily thereafter., Disp: 159 tablet, Rfl: 0   Pirfenidone  267 MG TABS, Take 2 tablets (534 mg total) by mouth 3 (three) times daily with meals. **low dose as maintenance**, Disp: 180 tablet, Rfl: 1   potassium chloride  SA (KLOR-CON  M) 20 MEQ tablet, TAKE 1 TABLET BY MOUTH EVERY DAY, Disp: 90 tablet, Rfl: 3   rosuvastatin  (CRESTOR ) 40 MG tablet, Take 1 tablet (40 mg total) by mouth daily., Disp: 90 tablet, Rfl: 2   sertraline  (ZOLOFT ) 100 MG tablet, Take 100 mg by mouth daily., Disp: , Rfl:    silver  sulfADIAZINE  (SILVADENE ) 1 % cream, Apply 1 Application topically as needed., Disp: , Rfl:    tamsulosin  (FLOMAX ) 0.4 MG CAPS capsule, Take 1 capsule (0.4 mg total) by mouth at bedtime., Disp: 30 capsule, Rfl: 1   Teriparatide , Recombinant, 620 MCG/2.48ML SOPN, inject 20 mcg Subcutaneous Once a day, Disp: 1 mL, Rfl: 11   torsemide  (DEMADEX ) 20 MG tablet, Take 4 tablets (80 mg total) by mouth every morning AND 3 tablets (60 mg total) every evening., Disp: 180 tablet, Rfl: 6   mupirocin  ointment (BACTROBAN ) 2 %, Apply 1 Application topically 2 (two) times daily., Disp: 30 g, Rfl: 2   spironolactone  (ALDACTONE ) 25  MG tablet, Take 0.5 tablets (12.5 mg total) by mouth daily. PLEASE SCHEDULE APPOINTMENT FOR MORE REFILLS, Disp: 45 tablet, Rfl: 3      Objective:   Vitals:   11/26/24 0847  BP: 116/64  Pulse: 95  Temp: 97.6 F (36.4 C)  TempSrc: Oral  SpO2: 98%  Weight: 164 lb 9.6 oz (74.7 kg)   Height: 5' 8.5 (1.74 m)    Estimated body mass index is 24.66 kg/m as calculated from the following:   Height as of this encounter: 5' 8.5 (1.74 m).   Weight as of this encounter: 164 lb 9.6 oz (74.7 kg).  @WEIGHTCHANGE @  [employer]   11/26/24 0847  Weight: 164 lb 9.6 oz (74.7 kg)     Physical Exam   General: No distress. Looks bruised in face O2 at rest: no Cane present: no Sitting in wheel chair: no Frail: no Obese: no Neuro: Alert and Oriented x 3. GCS 15. Speech normal Psych: Pleasant Resp:  Barrel Chest - no.  Wheeze - no, Crackles - no, No overt respiratory distress CVS: Normal heart sounds. Murmurs - no Ext: Stigmata of Connective Tissue Disease - no HEENT: Normal upper airway. PEERL +. 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 t hrug spring 2024 0> since then stable on PFT through 11/26/2024\  esbriet  low dose protocol -helping   Plan - check CBC, BMET, LFT 11/26/2024 - continue  pirfenidone  low dose protocol - do not qualify for clinical trials due to memory issues - CT chest HRCT in 4 months   Pedal edema Severe tricuspid regurgitation Right ventricular dysfunction  -  RHC 2024 shows pulmonary venous congenstion related incrased pressures - last echo spring 2024  Plan   = repeat echo spring 2026    Hx of OSA  Plan  - continue CPAP through sleep doctors    Follow-up - Return in the next 16 weeks to see Candis Dandy at [facility] after CT and echo  - exercise hypoxemia test at followu     FOLLOWUP    Return for  - Return in the next 16 weeks to see Candis Dandy at [facility] after CT and echo.    SIGNATURE    Dr. Dorethia Cave, M.D., F.C.C.P,   Pulmonary and Critical Care Medicine Staff Physician, [facility] Director - Interstitial Lung Disease  Program  Pulmonary Fibrosis [facility] at [facility] [place], [place], [address]  Pager: [phone], If no answer or between  15:00h - 7:00h: call [phone] Telephone: [phone]  9:04 AM 11/26/2024

## 2024-11-28 ENCOUNTER — Other Ambulatory Visit: Payer: Self-pay

## 2024-11-28 NOTE — Progress Notes (Signed)
 Specialty Pharmacy Refill Coordination Note  [name]. is a 83 y.o. male contacted today regarding refills of specialty medication(s) Pirfenidone    Patient requested Delivery   Delivery date: 12/02/24   Verified address: [address] [place], [place] [address]   Medication will be filled on: 12/01/24  Spoke with patient's wife

## 2024-12-01 ENCOUNTER — Other Ambulatory Visit: Payer: Self-pay

## 2024-12-04 ENCOUNTER — Ambulatory Visit: Admitting: Adult Health

## 2024-12-04 ENCOUNTER — Encounter: Payer: Self-pay | Admitting: Adult Health

## 2024-12-04 VITALS — BP 107/74 | HR 136 | Ht 68.0 in | Wt 161.4 lb

## 2024-12-04 DIAGNOSIS — R413 Other amnesia: Secondary | ICD-10-CM

## 2024-12-04 DIAGNOSIS — G4733 Obstructive sleep apnea (adult) (pediatric): Secondary | ICD-10-CM

## 2024-12-04 NOTE — Progress Notes (Signed)
 PATIENT: [name]. DOB: 05/04/1941  REASON FOR VISIT: follow up HISTORY FROM: patient  Chief Complaint  Patient presents with   Follow-up    Patient in room 4 with wife Patient here for follow up, patients wife states that his memory has gotten worse.  ESS Score is 24      HISTORY OF PRESENT ILLNESS: Today 12/04/2024:  [name]. is a 83 y.o. male with a history of memory disturbance and obstructive sleep apnea on CPAP. Returns today for follow-up.  He is here today with his wife.  He reports during the month of September and October he had a fall that resulted in a big knot on his forehead.  He states it is now clearing up and he is able to wear the CPAP again.  He does wear a fullface mask.  Does not feel it leaking.  In regards to his memory his wife feels that it is worse.  He lives at home with her.  Able to complete all ADLs independently.  He was not driving but she recently had hip surgery and is unable to drive.  So he will drive short distances with her in the car.  She states that if she sends them to the grocery store shopping he struggles with getting everything on the list.  Sometimes will be in the grocery store 2 to 3 hours.  She reports that he is sleeping well but tends to go to bed between 2 and 3 AM and will sleep until 12 or 1 PM.  She reports he has a poor appetite.  No significant changes in his mood or behavior but does acknowledge that he is snappy at times.  He remains on Namenda 10 mg twice a day      04/03/23: [name]. is a 83 y.o. male with a history of obstructive sleep apnea on CPAP and memory disturbance. Returns today for follow-up.   OSA on CPAP: Download is below.  Patient still struggles with good sleep hygiene therefore he does not get more than 4 hours of sleep at a time.  He continues on modafinil  100 mg during the day. Wife states that he pays no attention to the time. She states that he sleeps about 16 hours a day.  Falls asleep during conversations. Doesn't use the CPAP but 4 hours. Takes it off and goes into the living room and sleep.   Memory: Wife feels that his memory is worse. Continues on Namenda 10 mg twice a day.  Able to complete ADLs independently but wife helps with dressing.  No longer drives.  He is wife reports that he can walk into her room and will forget why he is there    09/13/22: [name] is a 83 year old male with a history of obstructive sleep apnea on CPAP and memory disturbance.  He returns today for follow-up.  The patient states that CPAP works well for him.  He does notice the mask leaking and has not changed out recently.  His wife reports that he sleeps all times of the day.  He does not have a regular sleep routine.  Some nights he will not go to bed till 3 AM there is other nights that he goes to bed then wakes up at 2 AM and stays up for several hours.  Is using modafinil  during the day but not sure that is beneficial.  She reports that the pharmacist told her to increase it  to 200 however she has not done that.  Feels that memory is worse according to his wife. Wife reports that he gets distracted easily. He will take trash out but then will get distracted and start cleaning out the garage. Sometimes does not remember what he wife has told him. Able to do all ADLS independently. Does not drive typically. Appetite has decreased. Has been drinking ensure. Has continued on namenda 10 mg BID. Wife reports that he sleeps a lot during the day.     03/08/22: [name] is a 83 year old male with a history of obstructive sleep apnea on CPAP.  He returns today for follow-up.  He still struggles with daytime sleepiness.  He reports that he did try modafinil  50 mg daily.  Unsure of the benefit but reports insurance will no longer cover this.  In the past he was seeing Dr. Jenel for some memory issues.  His wife reports that she has noticed some issues with his memory.  He is able to  complete all ADLs independently.  He no longer drives.  He is on Namenda.     03/08/21: [name] is a 83 year old male with a history of obstructive sleep apnea on CPAP and excessive daytime sleepiness.  He returns today for follow-up.  He reports that there are some nights that he does not sleep greater than 4 hours.  He states that he also sleeps a lot during the day.  He does not think he ever got the prescription for modafinil .  He will check at home and ensure that he is not taking his medicine.  Wife reports that they can be driving and he will fall asleep in the car.  He does not operate a motor vehicle.  She states that she can have a conversation with him and he falls asleep during the conversation.    09/08/20: [name] is a 83 year old male with a history of obstructive sleep apnea on CPAP.  His download indicates that he uses machine 21 out of 30 days for compliance of 70%.  He uses machine greater than 4 hours 19 days for compliance of 63%.  On average he uses his machine 6 hours and 33 minutes.  His residual AHI is 3.6 on 6 to 16 cm of water with EPR of 3.  Leak in the 95th percentile is 43.2 L/min.  He reports that he just was able to get modafinil  last night.  He has not taken a dose yet.  HISTORY Patient CPAP download indicates that he did use his machine nightly for compliance of 100%. He uses machine greater than 4 hours 16 days for compliance of 53%. On average he uses his machine 4 hours and 35 minutes. His residual AHI is 3.7 on 6 to 16 cm of water with EPR of 3. Leak in the 95th percentile is 29.6 L/min.   The patient and his wife state that he continues to nap throughout the day. Wife reports that he typically goes back to bed around 2 PM and will not get up to 5 PM. Reports that he has several naps throughout the day. Patient reports that he would like to be more awake during the day. He is interested in trying medication  REVIEW OF SYSTEMS: Out of a complete 14 system  review of symptoms, the patient complains only of the following symptoms, and all other reviewed systems are negative.  See HPI  ALLERGIES: Allergies  Allergen Reactions   Aricept  [Donepezil ] Other (See Comments)  Hallucination    Baclofen Itching   Lipitor [Atorvastatin ] Other (See Comments)    Stiff joints   Nsaids Other (See Comments)    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   Warfarin And Related Other (See Comments)    Stiff joints   Enbrel [Etanercept] Itching    HOME MEDICATIONS: Outpatient Medications Prior to Visit  Medication Sig Dispense Refill   allopurinol  (ZYLOPRIM ) 300 MG tablet Take 1 tablet (300 mg total) by mouth daily. 30 tablet 0   colchicine 0.6 MG tablet      dapagliflozin  propanediol (FARXIGA ) 10 MG TABS tablet Take 1 tablet (10 mg total) by mouth daily before breakfast. 30 tablet 11   digoxin  (LANOXIN ) 0.125 MG tablet TAKE ONE TABLET BY MOUTH MONDAY THROUGH FRIDAY. DO NOT TAKE ON SATURDAY OR SUNDAY. 90 tablet 2   ezetimibe  (ZETIA ) 10 MG tablet Take 1 tablet (10 mg total) by mouth daily. 90 tablet 3   ferrous sulfate  325 (65 FE) MG tablet Take 1 tablet (325 mg total) by mouth daily with breakfast. 30 tablet 3   memantine (NAMENDA) 10 MG tablet Take 10 mg by mouth 2 (two) times daily.     metoprolol  succinate (TOPROL -XL) 25 MG 24 hr tablet Take 25 mg by mouth daily.     mirabegron  ER (MYRBETRIQ ) 50 MG TB24 tablet Take 1 tablet (50 mg total) by mouth daily. 30 tablet 3   modafinil  (PROVIGIL ) 100 MG tablet Take 1 tablet (100 mg total) by mouth daily. 30 tablet 5   Multiple Vitamin (MULTIVITAMIN WITH MINERALS) TABS tablet Take 1 tablet by mouth daily.     mupirocin  ointment (BACTROBAN ) 2 % Apply 1 Application topically 2 (two) times daily. 30 g 2   NON FORMULARY Pt uses a cpap nightly     pantoprazole  (PROTONIX ) 40 MG tablet Take 1 tablet (40 mg total) by mouth daily. 30 tablet 0   Pirfenidone  267 MG TABS Take 2 tablets (534 mg total) by mouth 3 (three) times daily with  meals. **low dose as maintenance** 180 tablet 1   potassium chloride  SA (KLOR-CON  M) 20 MEQ tablet TAKE 1 TABLET BY MOUTH EVERY DAY 90 tablet 3   rosuvastatin  (CRESTOR ) 40 MG tablet Take 1 tablet (40 mg total) by mouth daily. 90 tablet 2   sertraline  (ZOLOFT ) 100 MG tablet Take 100 mg by mouth daily.     silver  sulfADIAZINE  (SILVADENE ) 1 % cream Apply 1 Application topically as needed.     spironolactone  (ALDACTONE ) 25 MG tablet Take 0.5 tablets (12.5 mg total) by mouth daily. PLEASE SCHEDULE APPOINTMENT FOR MORE REFILLS 45 tablet 3   tamsulosin  (FLOMAX ) 0.4 MG CAPS capsule Take 1 capsule (0.4 mg total) by mouth at bedtime. 30 capsule 1   Teriparatide , Recombinant, 620 MCG/2.48ML SOPN inject 20 mcg Subcutaneous Once a day 1 mL 11   torsemide  (DEMADEX ) 20 MG tablet Take 4 tablets (80 mg total) by mouth every morning AND 3 tablets (60 mg total) every evening. 180 tablet 6   Pirfenidone  267 MG TABS Take 1 tab (267mg ) by mouth three times daily with a meal for 7 days. THEN, take 2 tabs (534mg ) by mouth three times daily thereafter. (Patient not taking: Reported on 12/04/2024) 159 tablet 0   No facility-administered medications prior to visit.    PAST MEDICAL HISTORY: Past Medical History:  Diagnosis Date   (HFpEF) heart failure with preserved ejection fraction (HCC)    a. 05/2013 Echo: EF 55%, mild LVH, diast dysfxn, Ao sclerosis, mildly  dil LA, RV dysfxn (poorly visualized), PASP ;  b. 03/2017 Echo: EF 55-60%, no rwma, triv MR, mildly dil RV, mod TR, PASP .   Atrial fibrillation (HCC)    s/p Cox Maze 1/09;  Multaq  Rx d/c'd in 2014 due to pulmo fibrosis;  coumadin  d/c'd in 2014 due to spontaneous subdural hematoma   BPH (benign prostatic hyperplasia)    CAD (coronary artery disease), native coronary artery    a. s/p CABG 12/2007;  b. Myoview  12/2011: EF 66%, no scar or ischemia; normal.   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 Jude   Peripheral neuropathy 07/31/2018   Pulmonary fibrosis (HCC)    Multaq  d/c'd 7/14   Subdural hematoma (HCC) 07/2012   spontaneous;  coumadin  d/c'd => no longer a candidate for anticoagulation    PAST SURGICAL HISTORY: Past Surgical History:  Procedure Laterality Date   AMPUTATION Left 05/17/2019   Procedure: AMPUTATION LEFT FOURTH TOE;  Surgeon: Addie Cordella Hamilton, MD;  Location: [facility] OR;  Service: Orthopedics;  Laterality: Left;   AMPUTATION TOE Right 02/06/2020   Procedure: AMPUTATION TOE;  Surgeon: Harden Jerona GAILS, MD;  Location[facility] OR;  Service: Orthopedics;  Laterality: Right;   AMPUTATION TOE Right 08/17/2020   Procedure: AMPUTATION TOE 4th toe;  Surgeon: Gershon Donnice SAUNDERS, DPM;  Location: [facility] ORS;  Service: Podiatry;  Laterality: Right;   APPENDECTOMY     CHOLECYSTECTOMY     CORONARY ARTERY BYPASS GRAFT     x3 (left internal mammary artery to distal left anterior descending coronary artery, saphenous vain graft to second circumflex marginal branch, saphenous vain graft to posterior descending coronary artery, endoscopic saphenous vain harvest from right thigh) and modified Cox - Maze IV procedure.  Sudie DEL. [name],MD. Electronically signed CHO/MEDQ D: 01/09/2008/ JOB: [id number] cc:  Norleen SAUNDERS Gent MD   CRANIOTOMY  07/30/2012   Procedure: CRANIOTOMY HEMATOMA EVACUATION SUBDURAL;  Surgeon: Arley SHAUNNA Helling, MD;  Location: [facility] NEURO ORS;  Service: Neurosurgery;  Laterality: Right;  Right craniotomy for evacuation of subdural hematoma   FOOT SURGERY     HERNIA REPAIR     INTRAMEDULLARY (IM) NAIL INTERTROCHANTERIC Right 02/04/2020   Procedure: INTRAMEDULLARY (IM) NAIL INTERTROCHANTRIC;  Surgeon: Kay Kemps, MD;  Location: [facility] OR;  Service: Orthopedics;  Laterality: Right;   ORCHIECTOMY     Left  /  testicular cancer   PACEMAKER PLACEMENT     PPM - St. Jude   PPM GENERATOR  CHANGEOUT N/A 06/25/2019   Procedure: PPM GENERATOR CHANGEOUT;  Surgeon: Waddell Danelle ORN, MD;  Location: [facility] INVASIVE CV LAB;  Service: Cardiovascular;  Laterality: N/A;   RIGHT HEART CATH N/A 05/22/2023   Procedure: RIGHT HEART CATH;  Surgeon: Rolan Ezra RAMAN, MD;  Location: [facility] INVASIVE CV LAB;  Service: Cardiovascular;  Laterality: N/A;    FAMILY HISTORY: Family History  Problem Relation Age of Onset   Heart disease Father    Heart attack Father    Heart failure Father    Heart disease Mother    Alzheimer's disease Mother    Dementia Mother     SOCIAL HISTORY: Social History   Socioeconomic History   Marital status: Married    Spouse name: Vikky   Number of children: 2   Years of education: Not on file  Highest education level: Not on file  Occupational History   Occupation: Retired- theatre stage manager  Tobacco Use   Smoking status: Former    Current packs/day: 0.00    Average packs/day: 2.0 packs/day for 20.0 years (40.0 ttl pk-yrs)    Types: Cigarettes    Start date: 02/21/1971    Quit date: 02/21/1991    Years since quitting: 33.8   Smokeless tobacco: Never  Vaping Use   Vaping status: Never Used  Substance and Sexual Activity   Alcohol use: No    Alcohol/week: 0.0 standard drinks of alcohol   Drug use: No   Sexual activity: Not Currently  Other Topics Concern   Not on file  Social History Narrative   Lives with wife   Right handed    Married with two children.     He is a emergency planning/management officer.     Patient is retired    Chief Executive Officer [employer]   Tobacco Use: Medium Risk (12/04/2024)   Patient History    Smoking Tobacco Use: Former    Smokeless Tobacco Use: Never    Passive Exposure: Not on Actuary Strain: Not on file  Food Insecurity: Not on file  Transportation Needs: Not on file  Physical Activity: Not on file  Stress: Not on file  Social Connections: Not on file  Intimate Partner Violence: Not on file  Depression (PHQ2-9): Not on file   Alcohol Screen: Not on file  Housing: Not on file  Utilities: Not on file  Health Literacy: Not on file      PHYSICAL EXAM  Vitals:   12/04/24 1345  BP: 107/74  Pulse: (!) 136  Weight: 161 lb 6.4 oz (73.2 kg)  Height: 5' 8 (1.727 m)    Body mass index is 24.54 kg/m.     12/04/2024    2:05 PM 04/02/2024    2:56 PM 10/03/2023    1:38 PM  MMSE - Mini Mental State Exam  Orientation to time 4 5 5   Orientation to Place 5 5 5   Registration 3 3 3   Attention/ Calculation 0 5 5  Recall 3 2 2   Language- name 2 objects 2 2 2   Language- repeat 1 1 1   Language- follow 3 step command 3 3 3   Language- read & follow direction 1 1 1   Write a sentence 1 1 1   Copy design 0 1 1  Total score 23 29 29       10/03/2023    3:04 PM  Montreal Cognitive Assessment   Visuospatial/ Executive (0/5) 3  Naming (0/3) 3  Attention: Read list of digits (0/2) 2  Attention: Read list of letters (0/1) 0  Attention: Serial 7 subtraction starting at 100 (0/3) 3  Language: Repeat phrase (0/2) 2  Language : Fluency (0/1) 0  Abstraction (0/2) 1  Delayed Recall (0/5) 0  Orientation (0/6) 5  Total 19      Generalized: Well developed, in no acute distress  Chest: Lungs clear to auscultation bilaterally  Neurological examination  Mentation: Alert oriented to time, place, history taking. Follows all commands speech and language fluent Cranial nerve II-XII: Extraocular movements were full, visual field were full on confrontational test Head turning and shoulder shrug  were normal and symmetric. Motor: The motor testing reveals 5 over 5 strength of all 4 extremities. Good symmetric motor tone is noted throughout.  Sensory: Sensory testing is intact to soft touch on all 4 extremities. No evidence of extinction is noted.  Gait and station: Uses a cane when ambulating  DIAGNOSTIC DATA (LABS, IMAGING, TESTING) - I reviewed patient records, labs, notes, testing and imaging myself where  available.  Lab Results  Component Value Date   WBC 4.5 11/26/2024   HGB 11.9 (L) 11/26/2024   HCT 34.9 (L) 11/26/2024   MCV 96.9 11/26/2024   PLT 98.0 (L) 11/26/2024      Component Value Date/Time   NA 139 11/26/2024 0910   NA 145 (H) 03/29/2023 1203   NA 141 02/23/2017 1237   K 3.3 (L) 11/26/2024 0910   K 3.8 02/23/2017 1237   CL 98 11/26/2024 0910   CO2 33 (H) 11/26/2024 0910   CO2 27 02/23/2017 1237   GLUCOSE 96 11/26/2024 0910   GLUCOSE 113 02/23/2017 1237   BUN 25 (H) 11/26/2024 0910   BUN 31 (H) 03/29/2023 1203   BUN 18.3 02/23/2017 1237   CREATININE 1.43 11/26/2024 0910   CREATININE 1.74 (H) 11/13/2023 1107   CREATININE 1.2 02/23/2017 1237   CALCIUM  8.8 11/26/2024 0910   CALCIUM  9.6 02/23/2017 1237   PROT 7.5 11/26/2024 0910   PROT 7.5 10/10/2024 1411   PROT 8.0 02/23/2017 1237   ALBUMIN  4.0 11/26/2024 0910   ALBUMIN  4.3 10/10/2024 1411   ALBUMIN  4.1 02/23/2017 1237   AST 29 11/26/2024 0910   AST 34 11/13/2023 1107   AST 24 02/23/2017 1237   ALT 15 11/26/2024 0910   ALT 20 11/13/2023 1107   ALT 15 02/23/2017 1237   ALKPHOS 75 11/26/2024 0910   ALKPHOS 105 02/23/2017 1237   BILITOT 0.6 11/26/2024 0910   BILITOT 0.5 10/10/2024 1411   BILITOT 0.6 11/13/2023 1107   BILITOT 0.98 02/23/2017 1237   GFRNONAA 38 (L) 08/25/2024 1317   GFRNONAA 39 (L) 11/13/2023 1107   GFRAA 72 02/01/2021 1410   Lab Results  Component Value Date   CHOL 97 (L) 03/29/2023   HDL 47 03/29/2023   LDLCALC 36 03/29/2023   LDLDIRECT 154.2 10/20/2013   TRIG 65 03/29/2023   CHOLHDL 2.1 03/29/2023   Lab Results  Component Value Date   HGBA1C 5.6 05/21/2019   Lab Results  Component Value Date   VITAMINB12 439 05/17/2023   Lab Results  Component Value Date   TSH 5.573 (H) 06/26/2023      ASSESSMENT AND PLAN 83 y.o. year old male  has a past medical history of (HFpEF) heart failure with preserved ejection fraction (HCC), Atrial fibrillation (HCC), BPH (benign prostatic  hyperplasia), CAD (coronary artery disease), native coronary artery, DM (diabetes mellitus) (HCC), Hyperlipidemia type II, Hypertension, [name] (monoclonal gammopathy of unknown significance) (07/31/2018), OSA (obstructive sleep apnea), Pacemaker, Peripheral neuropathy (07/31/2018), Pulmonary fibrosis (HCC), and Subdural hematoma (HCC) (07/2012). here with:  Obstructive sleep apnea on CPAP  Restart CPAP Encourage patient continue using CPAP nightly and greater than 4 hours each night  Memory Disturbance  MMSE previously 24 out of 30 ( per Dr. Lionell note) today 23/30 Score has slightly declined Continue Namenda 10 mg BID   FU in 6 months or sooner if needed with Dr. Chalice Duwaine Russell, MSN, NP-C 12/04/2024, 1:51 PM [facility] [address], [address] [place], [place] [address] [phone]

## 2024-12-15 ENCOUNTER — Other Ambulatory Visit: Payer: Self-pay | Admitting: Gastroenterology

## 2024-12-15 DIAGNOSIS — K7460 Unspecified cirrhosis of liver: Secondary | ICD-10-CM

## 2024-12-16 ENCOUNTER — Ambulatory Visit (INDEPENDENT_AMBULATORY_CARE_PROVIDER_SITE_OTHER): Payer: Medicare Other

## 2024-12-16 DIAGNOSIS — I4819 Other persistent atrial fibrillation: Secondary | ICD-10-CM

## 2024-12-16 LAB — CUP PACEART REMOTE DEVICE CHECK
Battery Remaining Longevity: 57 mo
Battery Remaining Percentage: 50 %
Battery Voltage: 2.99 V
Brady Statistic AP VP Percent: 17 %
Brady Statistic AP VS Percent: 8.4 %
Brady Statistic AS VP Percent: 14 %
Brady Statistic AS VS Percent: 40 %
Brady Statistic RA Percent Paced: 9.5 %
Brady Statistic RV Percent Paced: 17 %
Date Time Interrogation Session: 20251230054129
Implantable Lead Connection Status: 753985
Implantable Lead Connection Status: 753985
Implantable Lead Implant Date: 20090420
Implantable Lead Implant Date: 20090420
Implantable Lead Location: 753859
Implantable Lead Location: 753860
Implantable Pulse Generator Implant Date: 20200708
Lead Channel Impedance Value: 440 Ohm
Lead Channel Impedance Value: 450 Ohm
Lead Channel Pacing Threshold Amplitude: 0.75 V
Lead Channel Pacing Threshold Amplitude: 1 V
Lead Channel Pacing Threshold Pulse Width: 0.4 ms
Lead Channel Pacing Threshold Pulse Width: 0.4 ms
Lead Channel Sensing Intrinsic Amplitude: 1.4 mV
Lead Channel Sensing Intrinsic Amplitude: 6.3 mV
Lead Channel Setting Pacing Amplitude: 2 V
Lead Channel Setting Pacing Amplitude: 2.5 V
Lead Channel Setting Pacing Pulse Width: 0.4 ms
Lead Channel Setting Sensing Sensitivity: 2 mV
Pulse Gen Model: 2272
Pulse Gen Serial Number: 9149180

## 2024-12-19 ENCOUNTER — Ambulatory Visit: Payer: Self-pay | Admitting: Cardiology

## 2024-12-24 ENCOUNTER — Other Ambulatory Visit: Payer: Self-pay

## 2024-12-24 ENCOUNTER — Other Ambulatory Visit: Payer: Self-pay | Admitting: Internal Medicine

## 2024-12-24 DIAGNOSIS — J84112 Idiopathic pulmonary fibrosis: Secondary | ICD-10-CM

## 2024-12-24 NOTE — Telephone Encounter (Signed)
 Pt requesting refill of specialty medication - routing to Rx team to advise.

## 2024-12-24 NOTE — Progress Notes (Signed)
 Remote PPM Transmission

## 2024-12-25 ENCOUNTER — Other Ambulatory Visit: Payer: Self-pay

## 2024-12-25 ENCOUNTER — Other Ambulatory Visit (HOSPITAL_COMMUNITY): Payer: Self-pay

## 2024-12-25 MED ORDER — PIRFENIDONE 267 MG PO TABS
534.0000 mg | ORAL_TABLET | Freq: Three times a day (TID) | ORAL | 3 refills | Status: DC
  Filled 2024-12-25 – 2025-01-16 (×5): qty 180, 30d supply, fill #0

## 2024-12-26 ENCOUNTER — Telehealth: Payer: Self-pay

## 2024-12-26 DIAGNOSIS — J84112 Idiopathic pulmonary fibrosis: Secondary | ICD-10-CM

## 2024-12-26 NOTE — Telephone Encounter (Signed)
 Received message from specialty pharmacy that patient's pirfenidone  copay is >$100.   Patient enrolled in grant through [facility].   Enrolled in grant through [facility], information as follows:  Card [id number] BIN [id number] PCN [id number] [id number] Help Desk [phone]

## 2024-12-29 ENCOUNTER — Ambulatory Visit
Admission: RE | Admit: 2024-12-29 | Discharge: 2024-12-29 | Disposition: A | Source: Ambulatory Visit | Attending: Gastroenterology | Admitting: Gastroenterology

## 2024-12-29 ENCOUNTER — Other Ambulatory Visit: Payer: Self-pay

## 2024-12-29 DIAGNOSIS — K7460 Unspecified cirrhosis of liver: Secondary | ICD-10-CM

## 2024-12-31 ENCOUNTER — Other Ambulatory Visit (HOSPITAL_COMMUNITY): Payer: Self-pay

## 2025-01-13 ENCOUNTER — Other Ambulatory Visit: Payer: Self-pay

## 2025-01-13 NOTE — Addendum Note (Signed)
 Addended by: SHAREN DELON HERO on: 01/13/2025 03:05 PM   Modules accepted: Orders

## 2025-01-15 ENCOUNTER — Other Ambulatory Visit: Payer: Self-pay

## 2025-01-16 ENCOUNTER — Other Ambulatory Visit: Payer: Self-pay

## 2025-01-20 ENCOUNTER — Other Ambulatory Visit: Payer: Self-pay

## 2025-01-22 ENCOUNTER — Other Ambulatory Visit (HOSPITAL_COMMUNITY): Payer: Self-pay

## 2025-01-22 ENCOUNTER — Other Ambulatory Visit: Payer: Self-pay

## 2025-01-22 ENCOUNTER — Ambulatory Visit

## 2025-01-22 DIAGNOSIS — Z79899 Other long term (current) drug therapy: Secondary | ICD-10-CM

## 2025-01-22 DIAGNOSIS — J84112 Idiopathic pulmonary fibrosis: Secondary | ICD-10-CM

## 2025-01-22 MED ORDER — PIRFENIDONE 267 MG PO TABS
534.0000 mg | ORAL_TABLET | Freq: Three times a day (TID) | ORAL | 3 refills | Status: AC
  Filled 2025-01-22: qty 180, 30d supply, fill #0

## 2025-01-22 NOTE — Progress Notes (Cosign Needed)
 [facility]  Referring Provider: Dorethia Cave (received verbal authorization for referral)  Virtual Visit via Telephone Note  I connected with [name], wife of [name]. on 01/22/25 at  1:00 PM EST by telephone and verified that I am speaking with the correct person using two identifiers.   Location: Patient wife: home Provider: office   I discussed the limitations, risks, security and privacy concerns of performing an evaluation and management service by telephone and the availability of in person appointments. I also discussed with the patient that there may be a patient responsible charge related to this service. The patient expressed understanding and agreed to proceed.   HPI: [name]. is a 84 y.o. 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  Patient Active Problem List   Diagnosis Date Noted   Abnormal reflex 05/31/2021   Age-related osteoporosis without current pathological fracture 05/31/2021   Asthma without status asthmaticus 05/31/2021   Carotid artery narrowing 05/31/2021   Chronic kidney disease, stage 3a (HCC) 05/31/2021   Cough 05/31/2021   Dependence on other enabling machines and devices 05/31/2021   Diabetic renal disease (HCC) 05/31/2021   Edema 05/31/2021   Gait abnormality 05/31/2021   Gastroesophageal reflux disease 05/31/2021   Gout 05/31/2021   Hearing loss 05/31/2021   History of adenomatous polyp of colon 05/31/2021   History of amputation of toe 05/31/2021   History of atrial fibrillation 05/31/2021   History of COVID-19 05/31/2021   Increased frequency of urination 05/31/2021   Non-pressure chronic ulcer of other part of right foot limited to breakdown of skin (HCC) 05/31/2021   Osteoarthritis 05/31/2021   Osteomyelitis (HCC) 05/31/2021   Other specified postprocedural states 05/31/2021   Personal history of  transient ischemic attack (TIA), and cerebral infarction without residual deficits 05/31/2021   Pneumonia due to coronavirus disease 2019 05/31/2021   Pure hypercholesterolemia 05/31/2021   Purpura senilis 05/31/2021   Recurrent depression 05/31/2021   Skin sensation disturbance 05/31/2021   Type 2 diabetes mellitus with foot ulcer (CODE) (HCC) 05/31/2021   Unspecified cirrhosis of liver (HCC) 05/31/2021   Uses hearing aid 05/31/2021   Non-healing amputation site (HCC) 11/08/2020   Severe sepsis (HCC) 08/14/2020   Cellulitis of right lower leg 08/13/2020   Pulmonary fibrosis (HCC) 08/05/2020   OSA on CPAP 02/11/2020   Persistent hypersomnia 02/11/2020   Cognitive decline 02/11/2020   Hip fracture (HCC) 02/09/2020   Osteomyelitis of second toe of right foot (HCC)    Closed right hip fracture, initial encounter (HCC) 02/04/2020   Closed intertrochanteric fracture of right hip, initial encounter (HCC) 02/04/2020   Closed nondisplaced intertrochanteric fracture of right femur (HCC)    Neck pain 10/22/2019   Symptomatic bradycardia 09/24/2019   Chronic diastolic heart failure (HCC) 06/03/2019   Coronary artery disease involving native heart without angina pectoris 06/03/2019   Debility 05/26/2019   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   Toe infection 05/17/2019   DOE (dyspnea on exertion) 05/15/2019   Severe tricuspid regurgitation 05/15/2019   Chronic right-sided CHF (congestive heart failure) (HCC) 05/15/2019   Cellulitis of fourth toe of left foot    Excessive daytime sleepiness 02/11/2019   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) 02/11/2019   Iron deficiency anemia 02/04/2019   Poor memory 12/16/2018   Deficiency anemia 11/19/2018   Other fatigue 11/19/2018   History of elevated PSA 11/19/2018   Prostate cancer  screening 11/19/2018   History of prostate cancer 11/19/2018   Pancytopenia, acquired (HCC) 08/13/2018   Recurrent falls 08/13/2018    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   Peripheral neuropathy 07/31/2018   [name] (monoclonal gammopathy of unknown significance) 07/31/2018   Anemia, chronic disease 01/28/2018   History of skin cancer 07/27/2017   Fatty liver disease, nonalcoholic 11/28/2016   Thrombocytopenia 11/27/2016   Lung nodule 04/23/2014   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   Long term (current) use of anticoagulants 11/22/2011   Abdominal bruit 03/07/2011   Pacemaker-St.Jude    Hx of CABG    Hyperlipidemia type II    OSA (obstructive sleep apnea)    PACEMAKER, PERMANENT 08/17/2010   HYPERLIPIDEMIA 08/16/2010   Obesity 08/16/2010   Essential hypertension 08/16/2010   Atrial fibrillation (HCC) 08/16/2010   IRREGULAR HEART RATE 08/16/2010   Sleep apnea 08/16/2010   BENIGN PROSTATIC HYPERTROPHY, HX OF 08/16/2010    Patient's Medications  New Prescriptions   No medications on file  Previous Medications   ALLOPURINOL  (ZYLOPRIM ) 300 MG TABLET    Take 1 tablet (300 mg total) by mouth daily.   COLCHICINE 0.6 MG TABLET       DAPAGLIFLOZIN  PROPANEDIOL (FARXIGA ) 10 MG TABS TABLET    Take 1 tablet (10 mg total) by mouth daily before breakfast.   DIGOXIN  (LANOXIN ) 0.125 MG TABLET    TAKE ONE TABLET BY MOUTH MONDAY THROUGH FRIDAY. DO NOT TAKE ON SATURDAY OR SUNDAY.   EZETIMIBE  (ZETIA ) 10 MG TABLET    Take 1 tablet (10 mg total) by mouth daily.   FERROUS SULFATE  325 (65 FE) MG TABLET    Take 1 tablet (325 mg total) by mouth daily with breakfast.   MEMANTINE (NAMENDA) 10 MG TABLET    Take 10 mg by mouth 2 (two) times daily.   METOPROLOL  SUCCINATE (TOPROL -XL) 25 MG 24 HR TABLET    Take 25 mg by mouth daily.   MIRABEGRON  ER (MYRBETRIQ ) 50 MG TB24 TABLET    Take 1 tablet (50 mg total) by mouth daily.   MODAFINIL  (PROVIGIL ) 100 MG TABLET    Take 1 tablet (100 mg total) by mouth daily.   MULTIPLE VITAMIN (MULTIVITAMIN WITH MINERALS) TABS TABLET    Take 1 tablet by mouth daily.   MUPIROCIN   OINTMENT (BACTROBAN ) 2 %    Apply 1 Application topically 2 (two) times daily.   NON FORMULARY    Pt uses a cpap nightly   PANTOPRAZOLE  (PROTONIX ) 40 MG TABLET    Take 1 tablet (40 mg total) by mouth daily.   POTASSIUM CHLORIDE  SA (KLOR-CON  M) 20 MEQ TABLET    TAKE 1 TABLET BY MOUTH EVERY DAY   ROSUVASTATIN  (CRESTOR ) 40 MG TABLET    Take 1 tablet (40 mg total) by mouth daily.   SERTRALINE  (ZOLOFT ) 100 MG TABLET    Take 100 mg by mouth daily.   SILVER  SULFADIAZINE  (SILVADENE ) 1 % CREAM    Apply 1 Application topically as needed.   SPIRONOLACTONE  (ALDACTONE ) 25 MG TABLET    Take 0.5 tablets (12.5 mg total) by mouth daily. PLEASE SCHEDULE APPOINTMENT FOR MORE REFILLS   TAMSULOSIN  (FLOMAX ) 0.4 MG CAPS CAPSULE    Take 1 capsule (0.4 mg total) by mouth at bedtime.   TERIPARATIDE , RECOMBINANT, 620 MCG/2.48ML SOPN    inject 20 mcg Subcutaneous Once a day   TORSEMIDE  (DEMADEX ) 20 MG TABLET    Take 4 tablets (80 mg total) by mouth every morning AND 3 tablets (60  mg total) every evening.  Modified Medications   Modified Medication Previous Medication   PIRFENIDONE  267 MG TABS Pirfenidone  267 MG TABS      Take 2 tablets (534 mg total) by mouth 3 (three) times daily with meals. **low dose as maintenance**    Take 2 tablets (534 mg total) by mouth 3 (three) times daily with meals. **low dose as maintenance**  Discontinued Medications   No medications on file    Allergies: Allergies[1]  Past Medical History: Past Medical History:  Diagnosis Date   (HFpEF) heart failure with preserved ejection fraction (HCC)    a. 05/2013 Echo: EF 55%, mild LVH, diast dysfxn, Ao sclerosis, mildly dil LA, RV dysfxn (poorly visualized), PASP ;  b. 03/2017 Echo: EF 55-60%, no rwma, triv MR, mildly dil RV, mod TR, PASP .   Atrial fibrillation (HCC)    s/p [facility] 1/09;  Multaq  Rx d/c'd in 2014 due to pulmo fibrosis;  coumadin  d/c'd in 2014 due to spontaneous subdural hematoma   BPH (benign prostatic hyperplasia)     CAD (coronary artery disease), native coronary artery    a. s/p CABG 12/2007;  b. Myoview  12/2011: EF 66%, no scar or ischemia; normal.   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 Jude   Peripheral neuropathy 07/31/2018   Pulmonary fibrosis (HCC)    Multaq  d/c'd 7/14   Subdural hematoma (HCC) 07/2012   spontaneous;  coumadin  d/c'd => no longer a candidate for anticoagulation    Social History: Social History   Socioeconomic History   Marital status: Married    Spouse name: Engineer, Maintenance (it)   Number of children: 2   Years of education: Not on file   Highest education level: Not on file  Occupational History   Occupation: Retired- theatre stage manager  Tobacco Use   Smoking status: Former    Current packs/day: 0.00    Average packs/day: 2.0 packs/day for 20.0 years (40.0 ttl pk-yrs)    Types: Cigarettes    Start date: 02/21/1971    Quit date: 02/21/1991    Years since quitting: 33.9   Smokeless tobacco: Never  Vaping Use   Vaping status: Never Used  Substance and Sexual Activity   Alcohol use: No    Alcohol/week: 0.0 standard drinks of alcohol   Drug use: No   Sexual activity: Not Currently  Other Topics Concern   Not on file  Social History Narrative   Lives with wife   Right handed    Married with two children.     He is a emergency planning/management officer.     Patient is retired    Chief Executive Officer [employer]   Tobacco Use: Medium Risk (12/04/2024)   Patient History    Smoking Tobacco Use: Former    Smokeless Tobacco Use: Never    Passive Exposure: Not on Actuary Strain: Not on file  Food Insecurity: Not on file  Transportation Needs: Not on file  Physical Activity: Not on file  Stress: Not on file  Social Connections: Not on file  Depression (EYV7-0): Not on file  Alcohol Screen: Not on file  Housing: Not on file   Utilities: Not on file  Health Literacy: Not on file    Medication: Pirfenidone  267mg  - 2 tablets (534mg ) three times daily  with meals   Assessment and Plan  Esbriet  Medication Management Thoroughly counseled patient on the efficacy, mechanism of action, dosing, administration, adverse effects, and monitoring parameters of Esbriet .  Patient verbalized understanding.   Goals of Therapy: Will not stop or reverse the progression of ILD. It will slow the progression of ILD.   Dosing: Starting dose will be Esbriet  267 mg 1 tablet three times daily for 7 days, then 2 tablets three times daily for 7 days, then 3 tablets three times daily.  Maintenance dose will be 801 mg 1 tablet three times daily if tolerated.  Stressed the importance of taking with meals and space at least 5-6 hours apart to minimize stomach upset.   Adverse Effects: Nausea, vomiting, diarrhea, weight loss Abdominal pain GERD Sun sensitivity/rash - patient advised to wear sunscreen when exposed to sunlight Dizziness Fatigue  Monitoring: Monitor for diarrhea, nausea and vomiting, GI perforation, hepatotoxicity  Monitor LFTs - baseline, monthly for first 6 months, then every 3 months routinely Pregnancy test - baseline prn CBC w differential at baseline and every 3 months routinely  Medication Reconciliation A drug regimen assessment was performed, including review of allergies, interactions, disease-state management, dosing and immunization history. Medications were reviewed with the patient, including name, instructions, indication, goals of therapy, potential side effects, importance of adherence, and safe use.   I discussed the assessment and treatment plan with the patient. The patient was provided an opportunity to ask questions and all were answered. The patient agreed with the plan and demonstrated an understanding of the instructions.   The patient was advised to call back or seek an in-person evaluation if  the symptoms worsen or if the condition fails to improve as anticipated.  I provided 10 minutes of non-face-to-face time during this encounter.  Delon Brow, PharmD, CSP, AAHIVP, CPP Clinical Pharmacist Practitioner - Medication Therapy Disease Management/Specialty Pharmacy Services 01/22/2025, 1:12 PM     [1]  Allergies Allergen Reactions   Aricept  [Donepezil ] Other (See Comments)    Hallucination    Baclofen Itching   Lipitor [Atorvastatin ] Other (See Comments)    Stiff joints   Nsaids Other (See Comments)    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   Warfarin And Related Other (See Comments)    Stiff joints   Enbrel [Etanercept] Itching

## 2025-01-22 NOTE — Progress Notes (Signed)
 Specialty Pharmacy Refill Coordination Note  Darold Luster Hechler. is a 84 y.o. male contacted today regarding refills of specialty medication(s) Pirfenidone    Patient requested Delivery   Delivery date: 01/26/25   Verified address: [address] [place], [place] [address]   Medication will be filled on: 01/23/25

## 2025-01-23 ENCOUNTER — Other Ambulatory Visit: Payer: Self-pay

## 2025-02-19 ENCOUNTER — Ambulatory Visit: Admitting: Podiatry

## 2025-03-09 ENCOUNTER — Ambulatory Visit (HOSPITAL_BASED_OUTPATIENT_CLINIC_OR_DEPARTMENT_OTHER)

## 2025-03-18 ENCOUNTER — Ambulatory Visit (HOSPITAL_BASED_OUTPATIENT_CLINIC_OR_DEPARTMENT_OTHER)

## 2025-08-25 ENCOUNTER — Other Ambulatory Visit

## 2025-09-04 ENCOUNTER — Ambulatory Visit: Admitting: Hematology and Oncology

## 2025-11-02 ENCOUNTER — Ambulatory Visit: Admitting: Adult Health
